# Patient Record
Sex: Male | Born: 1950
Health system: Southern US, Community
[De-identification: ages and names within clinical notes are randomized; demographics above are authoritative.]

## PROBLEM LIST (undated history)

## (undated) ENCOUNTER — Emergency Department (HOSPITAL_COMMUNITY): Admission: EM | Payer: Self-pay | Source: Home / Self Care

## (undated) DIAGNOSIS — J449 Chronic obstructive pulmonary disease, unspecified: Secondary | ICD-10-CM

## (undated) DIAGNOSIS — E785 Hyperlipidemia, unspecified: Secondary | ICD-10-CM

## (undated) DIAGNOSIS — M1990 Unspecified osteoarthritis, unspecified site: Secondary | ICD-10-CM

## (undated) DIAGNOSIS — F209 Schizophrenia, unspecified: Secondary | ICD-10-CM

## (undated) DIAGNOSIS — J9601 Acute respiratory failure with hypoxia: Secondary | ICD-10-CM

## (undated) DIAGNOSIS — K5792 Diverticulitis of intestine, part unspecified, without perforation or abscess without bleeding: Secondary | ICD-10-CM

## (undated) DIAGNOSIS — E7800 Pure hypercholesterolemia, unspecified: Secondary | ICD-10-CM

## (undated) DIAGNOSIS — E669 Obesity, unspecified: Secondary | ICD-10-CM

## (undated) DIAGNOSIS — N179 Acute kidney failure, unspecified: Secondary | ICD-10-CM

## (undated) DIAGNOSIS — F17200 Nicotine dependence, unspecified, uncomplicated: Secondary | ICD-10-CM

## (undated) DIAGNOSIS — E119 Type 2 diabetes mellitus without complications: Secondary | ICD-10-CM

## (undated) DIAGNOSIS — R0902 Hypoxemia: Secondary | ICD-10-CM

## (undated) DIAGNOSIS — F32A Depression, unspecified: Secondary | ICD-10-CM

## (undated) DIAGNOSIS — R918 Other nonspecific abnormal finding of lung field: Secondary | ICD-10-CM

## (undated) DIAGNOSIS — I10 Essential (primary) hypertension: Secondary | ICD-10-CM

## (undated) DIAGNOSIS — F329 Major depressive disorder, single episode, unspecified: Secondary | ICD-10-CM

## (undated) HISTORY — DX: Essential (primary) hypertension: I10

## (undated) HISTORY — DX: Obesity, unspecified: E66.9

## (undated) HISTORY — DX: Schizophrenia, unspecified: F20.9

## (undated) HISTORY — DX: Hypoxemia: R09.02

## (undated) HISTORY — PX: SHOULDER SURGERY: SHX246

## (undated) HISTORY — DX: Hyperlipidemia, unspecified: E78.5

## (undated) HISTORY — DX: Nicotine dependence, unspecified, uncomplicated: F17.200

## (undated) HISTORY — DX: Diverticulitis of intestine, part unspecified, without perforation or abscess without bleeding: K57.92

---

## 1999-08-28 ENCOUNTER — Inpatient Hospital Stay (HOSPITAL_COMMUNITY): Admission: EM | Admit: 1999-08-28 | Discharge: 1999-08-31 | Payer: Self-pay | Admitting: Psychiatry

## 2001-04-15 ENCOUNTER — Emergency Department (HOSPITAL_COMMUNITY): Admission: EM | Admit: 2001-04-15 | Discharge: 2001-04-15 | Payer: Self-pay | Admitting: Emergency Medicine

## 2001-09-23 ENCOUNTER — Encounter: Payer: Self-pay | Admitting: Family Medicine

## 2001-09-23 ENCOUNTER — Ambulatory Visit (HOSPITAL_COMMUNITY): Admission: RE | Admit: 2001-09-23 | Discharge: 2001-09-23 | Payer: Self-pay | Admitting: Family Medicine

## 2003-07-06 ENCOUNTER — Inpatient Hospital Stay (HOSPITAL_COMMUNITY): Admission: EM | Admit: 2003-07-06 | Discharge: 2003-07-07 | Payer: Self-pay | Admitting: *Deleted

## 2003-07-06 ENCOUNTER — Encounter: Payer: Self-pay | Admitting: *Deleted

## 2003-07-07 ENCOUNTER — Encounter: Payer: Self-pay | Admitting: *Deleted

## 2004-11-01 ENCOUNTER — Ambulatory Visit: Payer: Self-pay | Admitting: Family Medicine

## 2005-03-08 ENCOUNTER — Ambulatory Visit: Payer: Self-pay | Admitting: Family Medicine

## 2005-07-05 ENCOUNTER — Ambulatory Visit: Payer: Self-pay | Admitting: Family Medicine

## 2005-09-21 ENCOUNTER — Ambulatory Visit: Payer: Self-pay | Admitting: Family Medicine

## 2005-10-09 ENCOUNTER — Ambulatory Visit: Payer: Self-pay | Admitting: Family Medicine

## 2006-01-03 ENCOUNTER — Ambulatory Visit: Payer: Self-pay | Admitting: Family Medicine

## 2006-02-28 ENCOUNTER — Ambulatory Visit: Payer: Self-pay | Admitting: Family Medicine

## 2006-04-17 ENCOUNTER — Ambulatory Visit: Payer: Self-pay | Admitting: Family Medicine

## 2006-09-20 ENCOUNTER — Ambulatory Visit: Payer: Self-pay | Admitting: Family Medicine

## 2006-12-24 ENCOUNTER — Ambulatory Visit: Payer: Self-pay | Admitting: Family Medicine

## 2007-01-28 ENCOUNTER — Ambulatory Visit (HOSPITAL_COMMUNITY): Admission: RE | Admit: 2007-01-28 | Discharge: 2007-01-28 | Payer: Self-pay | Admitting: Family Medicine

## 2007-01-28 ENCOUNTER — Encounter: Payer: Self-pay | Admitting: Family Medicine

## 2007-01-28 LAB — CONVERTED CEMR LAB
ALT: 29 units/L (ref 0–53)
AST: 26 units/L (ref 0–37)
Albumin: 4.7 g/dL (ref 3.5–5.2)
Alkaline Phosphatase: 50 units/L (ref 39–117)
BUN: 7 mg/dL (ref 6–23)
Basophils Absolute: 0 10*3/uL (ref 0.0–0.1)
Basophils Relative: 1 % (ref 0–1)
Bilirubin, Direct: 0.1 mg/dL (ref 0.0–0.3)
CO2: 26 meq/L (ref 19–32)
Calcium: 10 mg/dL (ref 8.4–10.5)
Chloride: 104 meq/L (ref 96–112)
Cholesterol: 155 mg/dL (ref 0–200)
Creatinine, Ser: 1.09 mg/dL (ref 0.40–1.50)
Eosinophils Absolute: 0.2 10*3/uL (ref 0.0–0.7)
Eosinophils Relative: 3 % (ref 0–5)
Glucose, Bld: 100 mg/dL — ABNORMAL HIGH (ref 70–99)
HCT: 43 % (ref 39.0–52.0)
HDL: 31 mg/dL — ABNORMAL LOW (ref >39)
Hemoglobin: 13.7 g/dL (ref 13.0–17.0)
Hgb A1c MFr Bld: 7.5 % — ABNORMAL HIGH (ref 4.6–6.1)
Indirect Bilirubin: 0.5 mg/dL (ref 0.0–0.9)
LDL Cholesterol: 81 mg/dL (ref 0–99)
Lymphocytes Relative: 45 % (ref 12–46)
Lymphs Abs: 2.5 10*3/uL (ref 0.7–3.3)
MCHC: 31.9 g/dL (ref 30.0–36.0)
MCV: 89 fL (ref 78.0–100.0)
Monocytes Absolute: 0.4 10*3/uL (ref 0.2–0.7)
Monocytes Relative: 7 % (ref 3–11)
Neutro Abs: 2.5 10*3/uL (ref 1.7–7.7)
Neutrophils Relative %: 45 % (ref 43–77)
Platelets: 272 10*3/uL (ref 150–400)
Potassium: 4.3 meq/L (ref 3.5–5.3)
RBC: 4.83 M/uL (ref 4.22–5.81)
RDW: 15.4 % — ABNORMAL HIGH (ref 11.5–14.0)
Sodium: 142 meq/L (ref 135–145)
Total Bilirubin: 0.6 mg/dL (ref 0.3–1.2)
Total CHOL/HDL Ratio: 5
Total Protein: 7.8 g/dL (ref 6.0–8.3)
Triglycerides: 216 mg/dL — ABNORMAL HIGH (ref <150)
VLDL: 43 mg/dL — ABNORMAL HIGH (ref 0–40)
WBC: 5.7 10*3/uL (ref 4.0–10.5)

## 2007-05-23 ENCOUNTER — Ambulatory Visit: Payer: Self-pay | Admitting: Family Medicine

## 2007-06-06 ENCOUNTER — Encounter: Payer: Self-pay | Admitting: Family Medicine

## 2007-06-06 LAB — CONVERTED CEMR LAB
ALT: 29 units/L (ref 0–53)
AST: 23 units/L (ref 0–37)
Albumin: 4.7 g/dL (ref 3.5–5.2)
Alkaline Phosphatase: 50 units/L (ref 39–117)
BUN: 7 mg/dL (ref 6–23)
Bilirubin, Direct: 0.1 mg/dL (ref 0.0–0.3)
CO2: 21 meq/L (ref 19–32)
Calcium: 10.1 mg/dL (ref 8.4–10.5)
Chloride: 105 meq/L (ref 96–112)
Creatinine, Ser: 1.07 mg/dL (ref 0.40–1.50)
Glucose, Bld: 106 mg/dL — ABNORMAL HIGH (ref 70–99)
Indirect Bilirubin: 0.3 mg/dL (ref 0.0–0.9)
PSA: 0.83 ng/mL (ref 0.10–4.00)
Potassium: 3.9 meq/L (ref 3.5–5.3)
Sodium: 140 meq/L (ref 135–145)
Total Bilirubin: 0.4 mg/dL (ref 0.3–1.2)
Total Protein: 7.9 g/dL (ref 6.0–8.3)

## 2007-10-14 ENCOUNTER — Ambulatory Visit: Payer: Self-pay | Admitting: Family Medicine

## 2007-12-02 ENCOUNTER — Ambulatory Visit: Payer: Self-pay | Admitting: Family Medicine

## 2008-01-25 ENCOUNTER — Emergency Department (HOSPITAL_COMMUNITY): Admission: EM | Admit: 2008-01-25 | Discharge: 2008-01-25 | Payer: Self-pay | Admitting: Emergency Medicine

## 2008-02-22 ENCOUNTER — Emergency Department (HOSPITAL_COMMUNITY): Admission: EM | Admit: 2008-02-22 | Discharge: 2008-02-22 | Payer: Self-pay | Admitting: Emergency Medicine

## 2008-05-28 ENCOUNTER — Ambulatory Visit: Payer: Self-pay | Admitting: Family Medicine

## 2008-05-29 ENCOUNTER — Encounter: Payer: Self-pay | Admitting: Family Medicine

## 2008-05-29 LAB — CONVERTED CEMR LAB: Microalb, Ur: 1.67 mg/dL (ref 0.00–1.89)

## 2008-06-02 DIAGNOSIS — F17200 Nicotine dependence, unspecified, uncomplicated: Secondary | ICD-10-CM | POA: Insufficient documentation

## 2008-06-02 DIAGNOSIS — IMO0001 Reserved for inherently not codable concepts without codable children: Secondary | ICD-10-CM | POA: Insufficient documentation

## 2008-06-02 DIAGNOSIS — E1165 Type 2 diabetes mellitus with hyperglycemia: Secondary | ICD-10-CM

## 2008-06-02 DIAGNOSIS — I10 Essential (primary) hypertension: Secondary | ICD-10-CM | POA: Insufficient documentation

## 2008-06-02 DIAGNOSIS — E669 Obesity, unspecified: Secondary | ICD-10-CM | POA: Insufficient documentation

## 2008-06-02 DIAGNOSIS — F209 Schizophrenia, unspecified: Secondary | ICD-10-CM | POA: Insufficient documentation

## 2008-06-02 DIAGNOSIS — E785 Hyperlipidemia, unspecified: Secondary | ICD-10-CM | POA: Insufficient documentation

## 2008-06-25 ENCOUNTER — Encounter: Payer: Self-pay | Admitting: Family Medicine

## 2008-06-25 LAB — CONVERTED CEMR LAB
ALT: 28 units/L (ref 0–53)
AST: 24 units/L (ref 0–37)
Albumin: 4.7 g/dL (ref 3.5–5.2)
Alkaline Phosphatase: 46 units/L (ref 39–117)
BUN: 10 mg/dL (ref 6–23)
Bilirubin, Direct: 0.1 mg/dL (ref 0.0–0.3)
CO2: 19 meq/L (ref 19–32)
Calcium: 10.3 mg/dL (ref 8.4–10.5)
Chloride: 103 meq/L (ref 96–112)
Cholesterol: 154 mg/dL (ref 0–200)
Creatinine, Ser: 1.17 mg/dL (ref 0.40–1.50)
Glucose, Bld: 164 mg/dL — ABNORMAL HIGH (ref 70–99)
HDL: 26 mg/dL — ABNORMAL LOW (ref >39)
Indirect Bilirubin: 0.3 mg/dL (ref 0.0–0.9)
LDL Cholesterol: 69 mg/dL (ref 0–99)
PSA: 1.54 ng/mL (ref 0.10–4.00)
Potassium: 3.9 meq/L (ref 3.5–5.3)
Sodium: 140 meq/L (ref 135–145)
Total Bilirubin: 0.4 mg/dL (ref 0.3–1.2)
Total CHOL/HDL Ratio: 5.9
Total Protein: 7.4 g/dL (ref 6.0–8.3)
Triglycerides: 295 mg/dL — ABNORMAL HIGH (ref <150)
VLDL: 59 mg/dL — ABNORMAL HIGH (ref 0–40)

## 2008-07-08 ENCOUNTER — Ambulatory Visit: Payer: Self-pay | Admitting: Family Medicine

## 2008-07-16 ENCOUNTER — Telehealth: Payer: Self-pay | Admitting: Family Medicine

## 2008-08-14 ENCOUNTER — Encounter: Payer: Self-pay | Admitting: Family Medicine

## 2008-08-28 ENCOUNTER — Encounter: Payer: Self-pay | Admitting: Family Medicine

## 2008-10-22 ENCOUNTER — Ambulatory Visit: Payer: Self-pay | Admitting: Family Medicine

## 2008-10-22 LAB — CONVERTED CEMR LAB
Glucose, Bld: 129 mg/dL
Hgb A1c MFr Bld: 8.5 %

## 2009-01-15 ENCOUNTER — Encounter: Payer: Self-pay | Admitting: Family Medicine

## 2009-01-26 ENCOUNTER — Encounter: Payer: Self-pay | Admitting: Family Medicine

## 2009-02-25 ENCOUNTER — Ambulatory Visit: Payer: Self-pay | Admitting: Family Medicine

## 2009-02-25 DIAGNOSIS — R5383 Other fatigue: Secondary | ICD-10-CM | POA: Insufficient documentation

## 2009-02-25 DIAGNOSIS — N3000 Acute cystitis without hematuria: Secondary | ICD-10-CM | POA: Insufficient documentation

## 2009-02-25 DIAGNOSIS — R5381 Other malaise: Secondary | ICD-10-CM | POA: Insufficient documentation

## 2009-02-25 LAB — CONVERTED CEMR LAB
Bilirubin Urine: NEGATIVE
Blood in Urine, dipstick: NEGATIVE
Glucose, Bld: 164 mg/dL
Glucose, Urine, Semiquant: NEGATIVE
Hgb A1c MFr Bld: 7.1 %
Ketones, urine, test strip: NEGATIVE
Nitrite: NEGATIVE
Protein, U semiquant: NEGATIVE
Specific Gravity, Urine: 1.01
Urobilinogen, UA: 0.2
WBC Urine, dipstick: NEGATIVE
pH: 6

## 2009-02-27 ENCOUNTER — Encounter: Payer: Self-pay | Admitting: Family Medicine

## 2009-02-27 ENCOUNTER — Emergency Department (HOSPITAL_COMMUNITY): Admission: EM | Admit: 2009-02-27 | Discharge: 2009-02-27 | Payer: Self-pay | Admitting: Emergency Medicine

## 2009-03-08 ENCOUNTER — Ambulatory Visit: Payer: Self-pay | Admitting: Family Medicine

## 2009-03-08 DIAGNOSIS — R519 Headache, unspecified: Secondary | ICD-10-CM | POA: Insufficient documentation

## 2009-03-08 DIAGNOSIS — R51 Headache: Secondary | ICD-10-CM | POA: Insufficient documentation

## 2009-03-08 LAB — CONVERTED CEMR LAB: Glucose, Bld: 157 mg/dL

## 2009-03-10 ENCOUNTER — Encounter: Payer: Self-pay | Admitting: Family Medicine

## 2009-03-23 ENCOUNTER — Encounter: Payer: Self-pay | Admitting: Family Medicine

## 2009-03-24 ENCOUNTER — Ambulatory Visit: Payer: Self-pay | Admitting: Family Medicine

## 2009-03-24 ENCOUNTER — Telehealth: Payer: Self-pay | Admitting: Family Medicine

## 2009-03-24 LAB — CONVERTED CEMR LAB: Glucose, Bld: 136 mg/dL

## 2009-03-25 ENCOUNTER — Encounter: Payer: Self-pay | Admitting: Family Medicine

## 2009-07-13 ENCOUNTER — Encounter: Payer: Self-pay | Admitting: Family Medicine

## 2009-07-22 ENCOUNTER — Encounter: Payer: Self-pay | Admitting: Family Medicine

## 2009-07-23 ENCOUNTER — Encounter: Payer: Self-pay | Admitting: Family Medicine

## 2009-08-27 ENCOUNTER — Ambulatory Visit: Payer: Self-pay | Admitting: Family Medicine

## 2009-08-27 LAB — CONVERTED CEMR LAB
Blood Glucose, Fasting: 162 mg/dL
Hgb A1c MFr Bld: 6.2 %

## 2009-08-30 LAB — CONVERTED CEMR LAB
ALT: 54 units/L — ABNORMAL HIGH (ref 0–53)
AST: 33 units/L (ref 0–37)
Albumin: 4.6 g/dL (ref 3.5–5.2)
Alkaline Phosphatase: 56 units/L (ref 39–117)
BUN: 8 mg/dL (ref 6–23)
Basophils Absolute: 0 10*3/uL (ref 0.0–0.1)
Basophils Relative: 1 % (ref 0–1)
Bilirubin, Direct: 0.1 mg/dL (ref 0.0–0.3)
CO2: 23 meq/L (ref 19–32)
Calcium: 10.5 mg/dL (ref 8.4–10.5)
Chloride: 103 meq/L (ref 96–112)
Cholesterol: 140 mg/dL (ref 0–200)
Creatinine, Ser: 1.09 mg/dL (ref 0.40–1.50)
Creatinine, Urine: 196.4 mg/dL
Eosinophils Absolute: 0.2 10*3/uL (ref 0.0–0.7)
Eosinophils Relative: 4 % (ref 0–5)
Glucose, Bld: 99 mg/dL (ref 70–99)
HCT: 46.9 % (ref 39.0–52.0)
HDL: 34 mg/dL — ABNORMAL LOW (ref >39)
Hemoglobin: 15 g/dL (ref 13.0–17.0)
Indirect Bilirubin: 0.3 mg/dL (ref 0.0–0.9)
LDL Cholesterol: 45 mg/dL (ref 0–99)
Lymphocytes Relative: 50 % — ABNORMAL HIGH (ref 12–46)
Lymphs Abs: 2.9 10*3/uL (ref 0.7–4.0)
MCHC: 32 g/dL (ref 30.0–36.0)
MCV: 90.4 fL (ref 78.0–100.0)
Microalb Creat Ratio: 13 mg/g (ref 0.0–30.0)
Microalb, Ur: 2.55 mg/dL — ABNORMAL HIGH (ref 0.00–1.89)
Monocytes Absolute: 0.5 10*3/uL (ref 0.1–1.0)
Monocytes Relative: 8 % (ref 3–12)
Neutro Abs: 2.3 10*3/uL (ref 1.7–7.7)
Neutrophils Relative %: 39 % — ABNORMAL LOW (ref 43–77)
Platelets: 263 10*3/uL (ref 150–400)
Potassium: 4.1 meq/L (ref 3.5–5.3)
RBC: 5.19 M/uL (ref 4.22–5.81)
RDW: 14.2 % (ref 11.5–15.5)
Sodium: 141 meq/L (ref 135–145)
TSH: 1.121 microintl units/mL (ref 0.350–4.500)
Total Bilirubin: 0.4 mg/dL (ref 0.3–1.2)
Total CHOL/HDL Ratio: 4.1
Total Protein: 8 g/dL (ref 6.0–8.3)
Triglycerides: 307 mg/dL — ABNORMAL HIGH (ref <150)
VLDL: 61 mg/dL — ABNORMAL HIGH (ref 0–40)
WBC: 5.9 10*3/uL (ref 4.0–10.5)

## 2009-09-08 ENCOUNTER — Encounter: Payer: Self-pay | Admitting: Family Medicine

## 2009-09-23 ENCOUNTER — Ambulatory Visit: Payer: Self-pay | Admitting: Family Medicine

## 2009-10-21 ENCOUNTER — Telehealth: Payer: Self-pay | Admitting: Family Medicine

## 2009-10-21 ENCOUNTER — Encounter: Payer: Self-pay | Admitting: Family Medicine

## 2009-10-25 ENCOUNTER — Telehealth: Payer: Self-pay | Admitting: Family Medicine

## 2010-01-05 ENCOUNTER — Encounter (INDEPENDENT_AMBULATORY_CARE_PROVIDER_SITE_OTHER): Payer: Self-pay | Admitting: *Deleted

## 2010-03-01 ENCOUNTER — Ambulatory Visit: Payer: Self-pay | Admitting: Family Medicine

## 2010-03-01 ENCOUNTER — Encounter (INDEPENDENT_AMBULATORY_CARE_PROVIDER_SITE_OTHER): Payer: Self-pay | Admitting: *Deleted

## 2010-03-01 DIAGNOSIS — J209 Acute bronchitis, unspecified: Secondary | ICD-10-CM | POA: Insufficient documentation

## 2010-03-07 ENCOUNTER — Encounter: Payer: Self-pay | Admitting: Family Medicine

## 2010-05-11 ENCOUNTER — Encounter: Payer: Self-pay | Admitting: Family Medicine

## 2010-05-27 ENCOUNTER — Ambulatory Visit: Payer: Self-pay | Admitting: Family Medicine

## 2010-05-27 DIAGNOSIS — M542 Cervicalgia: Secondary | ICD-10-CM | POA: Insufficient documentation

## 2010-05-27 LAB — CONVERTED CEMR LAB
ALT: 36 units/L (ref 0–53)
AST: 29 units/L (ref 0–37)
Albumin: 4.5 g/dL (ref 3.5–5.2)
Alkaline Phosphatase: 45 units/L (ref 39–117)
BUN: 6 mg/dL (ref 6–23)
Bilirubin, Direct: 0.1 mg/dL (ref 0.0–0.3)
CO2: 24 meq/L (ref 19–32)
Calcium: 9.5 mg/dL (ref 8.4–10.5)
Chloride: 103 meq/L (ref 96–112)
Cholesterol: 156 mg/dL (ref 0–200)
Creatinine, Ser: 1.13 mg/dL (ref 0.40–1.50)
Glucose, Bld: 86 mg/dL (ref 70–99)
HDL: 33 mg/dL — ABNORMAL LOW (ref >39)
Hgb A1c MFr Bld: 6.1 % — ABNORMAL HIGH (ref <5.7)
Indirect Bilirubin: 0.3 mg/dL (ref 0.0–0.9)
Potassium: 4.2 meq/L (ref 3.5–5.3)
Sodium: 139 meq/L (ref 135–145)
Total Bilirubin: 0.4 mg/dL (ref 0.3–1.2)
Total CHOL/HDL Ratio: 4.7
Total Protein: 7.5 g/dL (ref 6.0–8.3)
Triglycerides: 428 mg/dL — ABNORMAL HIGH (ref <150)

## 2010-09-05 ENCOUNTER — Encounter: Payer: Self-pay | Admitting: Family Medicine

## 2010-10-07 ENCOUNTER — Ambulatory Visit: Payer: Self-pay | Admitting: Family Medicine

## 2010-10-07 LAB — CONVERTED CEMR LAB
BUN: 6 mg/dL (ref 6–23)
Basophils Absolute: 0 10*3/uL (ref 0.0–0.1)
Basophils Relative: 0 % (ref 0–1)
CO2: 22 meq/L (ref 19–32)
Calcium: 10 mg/dL (ref 8.4–10.5)
Chloride: 101 meq/L (ref 96–112)
Creatinine, Ser: 1.09 mg/dL (ref 0.40–1.50)
Eosinophils Absolute: 0.2 10*3/uL (ref 0.0–0.7)
Eosinophils Relative: 3 % (ref 0–5)
Glucose, Bld: 127 mg/dL — ABNORMAL HIGH (ref 70–99)
HCT: 41.7 % (ref 39.0–52.0)
Hemoglobin: 13.9 g/dL (ref 13.0–17.0)
Hgb A1c MFr Bld: 6.3 % — ABNORMAL HIGH (ref <5.7)
Lymphocytes Relative: 47 % — ABNORMAL HIGH (ref 12–46)
Lymphs Abs: 3.2 10*3/uL (ref 0.7–4.0)
MCHC: 33.3 g/dL (ref 30.0–36.0)
MCV: 88.3 fL (ref 78.0–100.0)
Monocytes Absolute: 0.7 10*3/uL (ref 0.1–1.0)
Monocytes Relative: 9 % (ref 3–12)
Neutro Abs: 2.8 10*3/uL (ref 1.7–7.7)
Neutrophils Relative %: 40 % — ABNORMAL LOW (ref 43–77)
PSA: 1.39 ng/mL (ref 0.10–4.00)
Platelets: 246 10*3/uL (ref 150–400)
Potassium: 3.8 meq/L (ref 3.5–5.3)
RBC: 4.72 M/uL (ref 4.22–5.81)
RDW: 14.1 % (ref 11.5–15.5)
Sodium: 137 meq/L (ref 135–145)
WBC: 6.9 10*3/uL (ref 4.0–10.5)

## 2010-10-20 ENCOUNTER — Telehealth: Payer: Self-pay | Admitting: Family Medicine

## 2010-11-03 ENCOUNTER — Encounter: Payer: Self-pay | Admitting: Family Medicine

## 2010-11-22 ENCOUNTER — Telehealth: Payer: Self-pay | Admitting: Family Medicine

## 2010-12-27 ENCOUNTER — Encounter: Payer: Self-pay | Admitting: Family Medicine

## 2010-12-28 LAB — CONVERTED CEMR LAB
ALT: 35 units/L (ref 0–53)
AST: 23 units/L (ref 0–37)
Albumin: 4.5 g/dL (ref 3.5–5.2)
Alkaline Phosphatase: 44 units/L (ref 39–117)
BUN: 16 mg/dL (ref 6–23)
Bilirubin, Direct: 0.2 mg/dL (ref 0.0–0.3)
CO2: 24 meq/L (ref 19–32)
Calcium: 10 mg/dL (ref 8.4–10.5)
Chloride: 104 meq/L (ref 96–112)
Cholesterol: 166 mg/dL (ref 0–200)
Creatinine, Ser: 1.08 mg/dL (ref 0.40–1.50)
Glucose, Bld: 112 mg/dL — ABNORMAL HIGH (ref 70–99)
HDL: 30 mg/dL — ABNORMAL LOW (ref >39)
Hgb A1c MFr Bld: 6.8 % — ABNORMAL HIGH (ref <5.7)
Indirect Bilirubin: 0.2 mg/dL (ref 0.0–0.9)
LDL Cholesterol: 70 mg/dL (ref 0–99)
Potassium: 3.9 meq/L (ref 3.5–5.3)
Sodium: 140 meq/L (ref 135–145)
Total Bilirubin: 0.4 mg/dL (ref 0.3–1.2)
Total CHOL/HDL Ratio: 5.5
Total Protein: 7.5 g/dL (ref 6.0–8.3)
Triglycerides: 329 mg/dL — ABNORMAL HIGH (ref <150)
VLDL: 66 mg/dL — ABNORMAL HIGH (ref 0–40)

## 2010-12-29 ENCOUNTER — Telehealth: Payer: Self-pay | Admitting: Family Medicine

## 2011-01-06 ENCOUNTER — Ambulatory Visit: Admit: 2011-01-06 | Payer: Self-pay | Admitting: Family Medicine

## 2011-01-10 ENCOUNTER — Encounter: Payer: Self-pay | Admitting: Family Medicine

## 2011-01-17 NOTE — Letter (Signed)
Summary: CCME  CCME   Imported By: Lind Guest 11/03/2010 08:31:29  _____________________________________________________________________  External Attachment:    Type:   Image     Comment:   External Document

## 2011-01-17 NOTE — Miscellaneous (Signed)
Summary: refill  Clinical Lists Changes  Medications: Rx of RISPERDAL 4 MG TABS (RISPERIDONE) Take 1 tab by mouth at bedtime;  #30 x 5;  Signed;  Entered by: Everitt Amber;  Authorized by: Syliva Overman MD;  Method used: Electronically to [facility]*, [address], [place], [place], [place]  [address], Ph: [phone], Fax: [phone]    Prescriptions: RISPERDAL 4 MG TABS (RISPERIDONE) Take 1 tab by mouth at bedtime  #30 x 5   Entered by:   Everitt Amber   Authorized by:   Syliva Overman MD   Signed by:   Everitt Amber on 07/13/2009   Method used:   Electronically to        [facility]* (retail)       [address] [place], [place]  [address]       Ph: [phone]       Fax: [phone]   RxID:   [id number]

## 2011-01-17 NOTE — Assessment & Plan Note (Signed)
Summary: OV   Vital Signs:  Patient Profile:   60 Years Old Male Height:     73 inches (185.42 cm) Weight:      270 pounds (122.73 kg) BMI:     35.75 BSA:     2.45 O2 Sat:      96 % Pulse rate:   75 / minute Resp:     14 per minute BP sitting:   160 / 94  (left arm)  Pt. in pain?   no  Vitals Entered By: Everitt Amber (October 22, 2008 1:22 PM)                  Chief Complaint:  Follow up.  History of Present Illness: Pt doing fairly well although he has not been testing his blood sugars regularly and has not been  diligent with his diet. he denies any recent fever or chills. he dnies depression, anxiety, insomnia or hallucinations. H e denies polyuria, or symotioms of uncontolled diabees. He is requesting that I prescribe his psychotic meds as his psychiatrist is leaving, he has been stable on the same meds for some time.     Updated Prior Medication List: METFORMIN HCL 1000 MG  TABS (METFORMIN HCL) Take 1 tablet by mouth two times a day CADUET 5-20 MG  TABS (AMLODIPINE-ATORVASTATIN) Take 1 tablet by mouth once a day ASPIRIN 325 MG  TABS (ASPIRIN) Take 1 tablet by mouth once a day ANTARA 130 MG  CAPS (FENOFIBRATE MICRONIZED) Take 1 tab by mouth at bedtime DIOVAN 160 MG  TABS (VALSARTAN) Take 1 tablet by mouth once a day RISPERDAL 4 MG  TABS (RISPERIDONE) Take 1 tab by mouth at bedtime OSCAL 500/200 D-3 500-200 MG-UNIT  TABS (CALCIUM-VITAMIN D) Take 1 tablet by mouth two times a day GLIPIZIDE 2.5 MG  TB24 (GLIPIZIDE) Take three tablets by mouth twice daily HYDROXYZINE HCL 25 MG TABS (HYDROXYZINE HCL) Take one tab by mouth at bedtime  Current Allergies: No known allergies     Risk Factors:     Counseled to quit/cut down tobacco use:  yes   Review of Systems  General      Denies chills, fatigue, fever, loss of appetite, malaise, sleep disorder, sweats, weakness, and weight loss.  ENT      Denies earache, hoarseness, nasal congestion, sinus pressure, and sore  throat.  CV      Denies chest pain or discomfort, palpitations, shortness of breath with exertion, and swelling of feet.  Resp      Denies cough, sputum productive, and wheezing.  GI      Denies abdominal pain, constipation, diarrhea, nausea, and vomiting.  GU      Denies dysuria and urinary frequency.  MS      Complains of joint pain, low back pain, muscle aches, and muscle weakness.  Psych      Complains of anxiety and depression.      Denies suicidal thoughts/plans, thoughts of violence, and unusual visions or sounds.  Endo      Denies cold intolerance, excessive hunger, excessive thirst, excessive urination, heat intolerance, polyuria, and weight change.  Allergy      Complains of seasonal allergies.   Physical Exam  General:     obese Head:     Normocephalic and atraumatic without obvious abnormalities. No apparent alopecia or balding. Eyes:     vision grossly intact.   Ears:     External ear exam shows no significant lesions or deformities.  Otoscopic  examination reveals clear canals, tympanic membranes are intact bilaterally without bulging, retraction, inflammation or discharge. Hearing is grossly normal bilaterally. Nose:     no nasal discharge.   Mouth:     pharynx pink and moist and poor dentition.   Neck:     No deformities, masses, or tenderness noted. Lungs:     Normal respiratory effort, chest expands symmetrically. Lungs are clear to auscultation, no crackles or wheezes. Heart:     Normal rate and regular rhythm. S1 and S2 normal without gallop, murmur, click, rub or other extra sounds. Abdomen:     soft and non-tender.   Extremities:     No clubbing, cyanosis, edema, or deformity noted with normal full range of motion of all joints.   Neurologic:     alert & oriented X3, cranial nerves II-XII intact, and strength normal in all extremities.   Skin:     Intact without suspicious lesions or rashes Cervical Nodes:     No lymphadenopathy  noted Psych:     Cognition and judgment appear intact. Alert and cooperative with normal attention span and concentration. No apparent delusions, illusions, hallucinations  Diabetes Management Exam:    Foot Exam (with socks and/or shoes not present):       Sensory-Pinprick/Light touch:          Left medial foot (L-4): diminished          Left dorsal foot (L-5): diminished          Left lateral foot (S-1): diminished          Right medial foot (L-4): diminished          Right dorsal foot (L-5): diminished          Right lateral foot (S-1): diminished       Inspection:          Left foot: normal          Right foot: normal       Nails:          Left foot: fungal infection          Right foot: fungal infection    Eye Exam:       Eye Exam done elsewhere    Impression & Recommendations:  Problem # 1:  NICOTINE ADDICTION (ICD-305.1) Assessment: Unchanged Encouraged smoking cessation and discussed different methods for smoking cessation.   Problem # 2:  SCHIZOPHRENIA (ICD-295.90) Assessment: Unchanged advise pt that as long a she is stable I will prescribe his meds  Problem # 3:  HYPERLIPIDEMIA (ICD-272.4) Assessment: Comment Only  His updated medication list for this problem includes:    Caduet 5-20 Mg Tabs (Amlodipine-atorvastatin) .Marland Kitchen... Take 1 tablet by mouth once a day    Antara 130 Mg Caps (Fenofibrate micronized) .Marland Kitchen... Take 1 tab by mouth at bedtime  Orders: T-Lipid Profile [phone])  Labs Reviewed: Chol: 154 (06/25/2008)   HDL: 26 (06/25/2008)   LDL: 69 (06/25/2008)   TG: 295 (06/25/2008) SGOT: 24 (06/25/2008)   SGPT: 28 (06/25/2008)   Problem # 4:  HYPERTENSION (ICD-401.9) Assessment: Deteriorated  The following medications were removed from the medication list:    Diovan 160 Mg Tabs (Valsartan) .Marland Kitchen... Take 1 tablet by mouth once a day  His updated medication list for this problem includes:    Caduet 5-20 Mg Tabs (Amlodipine-atorvastatin) .Marland Kitchen... Take 1  tablet by mouth once a day    Diovan 320 Mg Tabs (Valsartan) .Marland Kitchen... Take 1 tablet by mouth once a  day  Orders: T-Basic Metabolic Panel [phone])  BP today: 160/94  Labs Reviewed: Creat: 1.17 (06/25/2008) Chol: 154 (06/25/2008)   HDL: 26 (06/25/2008)   LDL: 69 (06/25/2008)   TG: 295 (06/25/2008)   Problem # 5:  DIABETES MELLITUS, TYPE II (ICD-250.00) Assessment: Deteriorated  The following medications were removed from the medication list:    Diovan 160 Mg Tabs (Valsartan) .Marland Kitchen... Take 1 tablet by mouth once a day  His updated medication list for this problem includes:    Metformin Hcl 1000 Mg Tabs (Metformin hcl) .Marland Kitchen... Take 1 tablet by mouth two times a day    Aspirin 325 Mg Tabs (Aspirin) .Marland Kitchen... Take 1 tablet by mouth once a day    Glipizide 10 Mg Xr24h-tab (Glipizide) .Marland Kitchen... Take 1 tablet by mouth two times a day    Diovan 320 Mg Tabs (Valsartan) .Marland Kitchen... Take 1 tablet by mouth once a day  Orders: Ophthalmology Referral (Ophthalmology) Glucose, (CBG) [phone]) Hemoglobin A1C [phone])  Labs Reviewed: HgBA1c: 8.5 (10/22/2008)   Creat: 1.17 (06/25/2008)   Microalbumin: 1.67 (05/29/2008)   Complete Medication List: 1)  Metformin Hcl 1000 Mg Tabs (Metformin hcl) .... Take 1 tablet by mouth two times a day 2)  Caduet 5-20 Mg Tabs (Amlodipine-atorvastatin) .... Take 1 tablet by mouth once a day 3)  Aspirin 325 Mg Tabs (Aspirin) .... Take 1 tablet by mouth once a day 4)  Antara 130 Mg Caps (Fenofibrate micronized) .... Take 1 tab by mouth at bedtime 5)  Risperdal 4 Mg Tabs (Risperidone) .... Take 1 tab by mouth at bedtime 6)  Oscal 500/200 D-3 500-200 Mg-unit Tabs (Calcium-vitamin d) .... Take 1 tablet by mouth two times a day 7)  Glipizide 10 Mg Xr24h-tab (Glipizide) .... Take 1 tablet by mouth two times a day 8)  Hydroxyzine Hcl 25 Mg Tabs (Hydroxyzine hcl) .... Take one tab by mouth at bedtime 9)  Risperdal 4 Mg Tabs (Risperidone) .... Take 1 tab by mouth at bedtime 10)  Diovan 320  Mg Tabs (Valsartan) .... Take 1 tablet by mouth once a day   Patient Instructions: 1)  Follow up end Feb or early March. 2)  Tobacco is very bad for your health and your loved ones! You Should stop smoking!. 3)  Stop Smoking Tips: Choose a Quit date. Cut down before the Quit date. decide what you will do as a substitute when you feel the urge to smoke(gum,toothpick,exercise). 4)  It is important that you exercise regularly at least 20 minutes 5 times a week. If you develop chest pain, have severe difficulty breathing, or feel very tired , stop exercising immediately and seek medical attention. 5)  You need to lose weight. Consider a lower calorie diet and regular exercise.  6)  BMP prior to visit, ICD-9: 7)  Hepatic Panel prior to visit, ICD-9:   End Feb or ealy March 8)  Lipid Panel prior to visit, ICD-9:   Prescriptions: ANTARA 130 MG  CAPS (FENOFIBRATE MICRONIZED) Take 1 tab by mouth at bedtime  #30 x 5   Entered by:   Everitt Amber   Authorized by:   Syliva Overman MD   Signed by:   Everitt Amber on 10/22/2008   Method used:   Electronically to        [facility]* (retail)       [address]       [place], [place]  [address]  Ph: [phone]       Fax: [phone]   RxID:   [id number] CADUET 5-20 MG  TABS (AMLODIPINE-ATORVASTATIN) Take 1 tablet by mouth once a day  #30 x 5   Entered by:   Everitt Amber   Authorized by:   Syliva Overman MD   Signed by:   Everitt Amber on 10/22/2008   Method used:   Electronically to        [facility]* (retail)       [address] [place], [place]  [address]       Ph: [phone]       Fax: [phone]   RxID:   [id number] METFORMIN HCL 1000 MG  TABS (METFORMIN HCL) Take 1 tablet by mouth two times a day  #30 x 5   Entered by:   Everitt Amber   Authorized by:   Syliva Overman MD   Signed by:   Everitt Amber on 10/22/2008   Method used:    Electronically to        [facility]* (retail)       [address] [place], [place]  [address]       Ph: [phone]       Fax: [phone]   RxID:   [id number] GLIPIZIDE 10 MG XR24H-TAB (GLIPIZIDE) Take 1 tablet by mouth two times a day  #60 x 5   Entered by:   Everitt Amber   Authorized by:   Syliva Overman MD   Signed by:   Everitt Amber on 10/22/2008   Method used:   Electronically to        [facility]* (retail)       [address]       [place], [place]  [address]       Ph: [phone]       Fax: [phone]   RxID:   [id number] DIOVAN 320 MG TABS (VALSARTAN) Take 1 tablet by mouth once a day  #30 x 5   Entered and Authorized by:   Syliva Overman MD   Signed by:   Syliva Overman MD on 10/22/2008   Method used:   Electronically to        [facility]* (retail)       [address] [place], [place]  [address]       Ph: [phone]       Fax: [phone]   RxID:   [id number] RISPERDAL 4 MG TABS (RISPERIDONE) Take 1 tab by mouth at bedtime  #30 x 5   Entered and Authorized by:   Syliva Overman MD   Signed by:   Syliva Overman MD on 10/22/2008   Method used:   Electronically to        [facility]* (retail)       [address] [place], [place]  [address]       Ph: [phone]       Fax: [phone]   RxID:   [id number]  ] Laboratory Results   Blood Tests   Date/Time Received: October 22, 2008 2pm Date/Time Reported: October 22, 2008  2pm  Glucose (random): 129 mg/dL   (Normal Range: 54-098) HGBA1C: 8.5%   (Normal Range: Non-Diabetic - 3-6%   Control Diabetic - 6-8%)

## 2011-01-17 NOTE — Miscellaneous (Signed)
Summary: Refill  Clinical Lists Changes  Medications: Changed medication from CADUET 10-40 MG TABS (AMLODIPINE-ATORVASTATIN) Take 1 tab by mouth at bedtime to AMLODIPINE BESYLATE 10 MG TABS (AMLODIPINE BESYLATE) Take 1 tablet by mouth at bedtime - Signed Added new medication of LIPITOR 40 MG TABS (ATORVASTATIN CALCIUM) Take 1 tab by mouth at bedtime - Signed Rx of AMLODIPINE BESYLATE 10 MG TABS (AMLODIPINE BESYLATE) Take 1 tablet by mouth at bedtime;  #30 x 3;  Signed;  Entered by: Everitt Amber;  Authorized by: Syliva Overman MD;  Method used: Electronically to [facility]*, [address], [place], [place], [place]  [address], Ph: [phone], Fax: [phone] Rx of LIPITOR 40 MG TABS (ATORVASTATIN CALCIUM) Take 1 tab by mouth at bedtime;  #30 x 3;  Signed;  Entered by: Everitt Amber;  Authorized by: Syliva Overman MD;  Method used: Electronically to [facility]*, [address] [place], [place], [place]  [address], Ph: [phone], Fax: [phone]    Prescriptions: LIPITOR 40 MG TABS (ATORVASTATIN CALCIUM) Take 1 tab by mouth at bedtime  #30 x 3   Entered by:   Everitt Amber   Authorized by:   Syliva Overman MD   Signed by:   Everitt Amber on 03/23/2009   Method used:   Electronically to        [facility]* (retail)       [address] [place], [place]  [address]       Ph: [phone]       Fax: [phone]   RxID:   [id number] AMLODIPINE BESYLATE 10 MG TABS (AMLODIPINE BESYLATE) Take 1 tablet by mouth at bedtime  #30 x 3   Entered by:   Everitt Amber   Authorized by:   Syliva Overman MD   Signed by:   Everitt Amber on 03/23/2009   Method used:   Electronically to        [facility]* (retail)       [address] [place], [place]  [address]       Ph: [phone]       Fax: [phone]   RxID:   [id number]

## 2011-01-17 NOTE — Progress Notes (Signed)
Summary: needs a cna  Phone Note Call from Patient Call back at Home Phone [phone]   Summary of Call:  [employer] the insurance said  they can help him with a cna at his house needs Dr. Lodema Hong to call [employer] [phone] leave member id # [id number] they will need his member id #  Initial call taken by: Lind Guest,  July 16, 2008 11:28 AM  Follow-up for Phone Call        contacted [employer] and was advised to send order to home health agency and the agency would need to call Follow-up by: Worthy Keeler LPN,  July 16, 2008 3:42 PM

## 2011-01-17 NOTE — Progress Notes (Signed)
  Phone Note Call from Patient   Summary of Call: Patient called and said that he just took his sugar 2 hours after eating and it was 589. He doesn't feel any symptoms of hypergylcemia but is concerned. Advised him to find a ride and get him to come in for nurse visit to have it checked and to recieve some insulin. Patient stated he would try to get a ride and come on in. Initial call taken by: Everitt Amber,  March 24, 2009 1:41 PM      Appended Document:  Patients sugar was 136 in office.

## 2011-01-17 NOTE — Progress Notes (Signed)
Summary: MEDICINE  Phone Note Call from Patient   Summary of Call: WANTS TO KNOW WILL YOU CALL IN SOME MALE [facility] MEDICINE   Doyce Para AT [phone] Initial call taken by: Lind Guest,  November 22, 2010 1:06 PM  Follow-up for Phone Call        pls let pt know med sent in Follow-up by: Syliva Overman MD,  November 23, 2010 4:58 PM    New/Updated Medications: VIAGRA 100 MG TABS (SILDENAFIL CITRATE) one tablet 30 minutes before intercourse Prescriptions: VIAGRA 100 MG TABS (SILDENAFIL CITRATE) one tablet 30 minutes before intercourse  #5 x 2   Entered and Authorized by:   Syliva Overman MD   Signed by:   Syliva Overman MD on 11/23/2010   Method used:   Electronically to        [facility]* (retail)       [address]       [place], [place]  [address]       Ph: [phone]       Fax: [phone]   RxID:   [id number]

## 2011-01-17 NOTE — Miscellaneous (Signed)
Summary: refill  Clinical Lists Changes  Medications: Rx of HYDROXYZINE HCL 25 MG TABS (HYDROXYZINE HCL) Take one tab by mouth at bedtime;  #30 Each x 4;  Signed;  Entered by: Everitt Amber;  Authorized by: Syliva Overman MD;  Method used: Electronically to [facility]*, [address], [place], [place], [place]  [address], Ph: [phone], Fax: [phone]    Prescriptions: HYDROXYZINE HCL 25 MG TABS (HYDROXYZINE HCL) Take one tab by mouth at bedtime  #30 Each x 4   Entered by:   Everitt Amber   Authorized by:   Syliva Overman MD   Signed by:   Everitt Amber on 07/22/2009   Method used:   Electronically to        [facility]* (retail)       [address] [place], [place]  [address]       Ph: [phone]       Fax: [phone]   RxID:   [id number]

## 2011-01-17 NOTE — Assessment & Plan Note (Signed)
Summary: FLU SHOT  Nurse Visit   Allergies: No Known Drug Allergies  Orders Added: 1)  Influenza Vaccine MCR [00025]   Influenza Vaccine    Vaccine Type: Fluvax MCR    Site: left deltoid    Mfr: novartis    Dose: 0.5 ml    Route: IM    Given by: Everitt Amber    Exp. Date: 03/2010    Lot #: [id number]

## 2011-01-17 NOTE — Assessment & Plan Note (Signed)
Summary: office visit   Vital Signs:  Patient profile:   60 year old male Weight:      252.08 pounds Pulse rate:   96 / minute Pulse rhythm:   regular BP sitting:   130 / 80  (right arm)  History of Present Illness: Reports increased inability to care for himself , needs assistance with ADl'S, requesting help, as soon as he gets medicaid. He states he needs help with meal preparation, bathing , cleaning his house. he repoprts feeling increasingly disabled. He still smokes 1PPd , would like to quit and has tried in the past. C/o chronic dry cough andshortnessof breath. he denies abdominal pain, change in bm , nausea or emesis. He denies chest pain, pND , orthopnea or leg swelling  Preventive Screening-Counseling & Management  Alcohol-Tobacco     Smoking Cessation Counseling: yes  Allergies: No Known Drug Allergies  Review of Systems      See HPI General:  Complains of fatigue, sleep disorder, and weakness; reports inc inability to care . Eyes:  Denies discharge and red eye. GU:  Denies dysuria, urinary frequency, and urinary hesitancy. MS:  Complains of joint pain, low back pain, mid back pain, and stiffness. Psych:  Complains of anxiety, depression, and mental problems; denies suicidal thoughts/plans, thoughts of violence, and unusual visions or sounds. Endo:  Denies excessive thirst and excessive urination. Heme:  Denies abnormal bruising and bleeding. Allergy:  Complains of seasonal allergies; denies hives or rash and itching eyes.  Physical Exam  General:  alert, appropriate dress, cooperative to examination, good hygiene, and overweight-appearing.   HEENT: No facial asymmetry,  EOMI, No sinus tenderness, TM's Clear, oropharynx  pink and moist. decreased ROM cervical spine  Chest: Clear to auscultation bilaterally. decreased aire entry bilaterally CVS: S1, S2, No murmurs, No S3.   Abd: Soft, Nontender.  rectal: no mass, guaic negative stool. MS: Adequate ROM spine,  hips, shoulders and knees.  Ext: No edema.   CNS: CN 2-12 intact, power tone and sensation normal throughout.   Skin: Intact, no visible lesions or rashes.  Psych: Good eye contact, normal affect.  Memory intact, not anxious or depressed appearing.flat affect   Diabetes Management Exam:    Foot Exam (with socks and/or shoes not present):       Sensory-Monofilament:          Left foot: absent          Right foot: absent       Inspection:          Left foot: normal          Right foot: normal       Nails:          Left foot: fungal infection          Right foot: fungal infection   Impression & Recommendations:  Problem # 1:  NICOTINE ADDICTION (ICD-305.1) Assessment Unchanged  Encouraged smoking cessation and discussed different methods for smoking cessation.   Problem # 2:  HYPERTENSION (ICD-401.9) Assessment: Unchanged  His updated medication list for this problem includes:    Diovan 320 Mg Tabs (Valsartan) .Marland Kitchen... Take 1 tablet by mouth once a day    Amlodipine Besylate 10 Mg Tabs (Amlodipine besylate) .Marland Kitchen... Take 1 tablet by mouth at bedtime    Maxzide-25 37.5-25 Mg Tabs (Triamterene-hctz) .Marland Kitchen... Take 1 tablet by mouth once a day  Orders: Medicare Electronic Prescription [phone]) T-Basic Metabolic Panel [phone]) T-Basic Metabolic Panel [phone]) Medicare Electronic Prescription [phone])  BP today: 130/80 Prior BP: 120/80 (05/27/2010)  Labs Reviewed: K+: 4.2 (05/27/2010) Creat: : 1.13 (05/27/2010)   Chol: 156 (05/27/2010)   HDL: 33 (05/27/2010)   LDL: See Comment mg/dL (18/84/1660)   TG: 630 (05/27/2010)  Problem # 3:  DIABETES MELLITUS, TYPE II (ICD-250.00) Assessment: Comment Only  The following medications were removed from the medication list:    Metformin Hcl 1000 Mg Tabs (Metformin hcl) .Marland Kitchen... Take 1 tablet by mouth two times a day    Glipizide 10 Mg Xr24h-tab (Glipizide) .Marland Kitchen... Take 1 tablet by mouth two times a day His updated medication list for this  problem includes:    Aspirin 325 Mg Tabs (Aspirin) .Marland Kitchen... Take 1 tablet by mouth once a day    Diovan 320 Mg Tabs (Valsartan) .Marland Kitchen... Take 1 tablet by mouth once a day    Glipizide 10 Mg Xr24h-tab (Glipizide) ..... One tablet art breakfast, once daily    Metformin Hcl 1000 Mg Tabs (Metformin hcl) .Marland Kitchen... Take 1 tablet by mouth two times a day  Orders: Medicare Electronic Prescription [phone]) T- Hemoglobin A1C [phone]) T- Hemoglobin A1C (02542-70623) Medicare Electronic Prescription [phone])  Labs Reviewed: Creat: 1.13 (05/27/2010)    Reviewed HgBA1c results: 6.1 (05/27/2010)  6.2 (08/27/2009)  Problem # 4:  OBESITY (ICD-278.00) Assessment: Unchanged  Orders: Medicare Electronic Prescription [phone])  Ht: 73.5 (05/27/2010)   Wt: 252.08 (10/07/2010)   BMI: 33.31 (05/27/2010) therapeutic lifestyle change discussed and encouraged  Problem # 5:  SPECIAL SCREENING FOR MALIGNANT NEOPLASMS COLON (ICD-V76.51) Assessment: Comment Only  Orders: Hemoccult Guaiac-1 spec.(in office) (82270)  Complete Medication List: 1)  Aspirin 325 Mg Tabs (Aspirin) .... Take 1 tablet by mouth once a day 2)  Oscal 500/200 D-3 500-200 Mg-unit Tabs (Calcium-vitamin d) .... Take 1 tablet by mouth two times a day 3)  Hydroxyzine Hcl 25 Mg Tabs (Hydroxyzine hcl) .... Take one tab by mouth at bedtime 4)  Risperdal 4 Mg Tabs (Risperidone) .... Take 1 tab by mouth at bedtime 5)  Diovan 320 Mg Tabs (Valsartan) .... Take 1 tablet by mouth once a day 6)  Amlodipine Besylate 10 Mg Tabs (Amlodipine besylate) .... Take 1 tablet by mouth at bedtime 7)  Maxzide-25 37.5-25 Mg Tabs (Triamterene-hctz) .... Take 1 tablet by mouth once a day 8)  Fioricet 50-325-40 Mg Tabs (Butalbital-apap-caffeine) .... Take 1 tablet by mouth two times a day as needed, max twice weekly 9)  Simvastatin 40 Mg Tabs (Simvastatin) .... One tab by mouth qhs 10)  Multivitamins Tabs (Multiple vitamin) .... Take 1 tablet by mouth once a day 11)   Ibuprofen 800 Mg Tabs (Ibuprofen) .... Take 1 tablet by mouth three times a day 12)  Glipizide 10 Mg Xr24h-tab (Glipizide) .... One tablet art breakfast, once daily 13)  Metformin Hcl 1000 Mg Tabs (Metformin hcl) .... Take 1 tablet by mouth two times a day  Other Orders: T-CBC w/Diff ([id number]) T-PSA [phone]) T-Hepatic Function [phone]) T-Lipid Profile [phone]) Influenza Vaccine MCR (93810)  Patient Instructions: 1)  Please schedule a follow-up appointment in 3.5 months. 2)  Tobacco is very bad for your health and your loved ones! You Should stop smoking!. 3)  Stop Smoking Tips: Choose a Quit date. Cut down before the Quit date. decide what you will do as a substitute when you feel the urge to smoke(gum,toothpick,exercise). 4)  It is important that you exercise regularly at least 20 minutes 5 times a week. If you develop chest pain, have severe difficulty breathing, or feel very tired ,  stop exercising immediately and seek medical attention. 5)  You need to lose weight. Consider a lower calorie diet and regular exercise.  6)  Check your blood sugars regularly. If your readings are usually above 250: or below 70 you should contact our office. 7)  Pls make appt with podiatrist when able, also let us know so we can request CAP for you. 8)  Flu vac and rectal today 9)  BMP prior to visit, ICD-9: 10)  CBC w/ Diff prior to visit, ICD-9:b   today 11)  PSA prior to visit, ICD-9: 12)  HbgA1C prior to visit, ICD-9: 13)  FASTING in 3.5 months 14)  BMP prior to visit, ICD-9: 15)  Hepatic Panel prior to visit, ICD-9: 16)  Lipid Panel prior to visit, ICD-9: 17)  HbgA1C prior to visit, ICD-9: Prescriptions: METFORMIN HCL 1000 MG TABS (METFORMIN HCL) Take 1 tablet by mouth two times a day  #60 x 3   Entered and Authorized by:   Syliva Overman MD   Signed by:   Syliva Overman MD on 10/07/2010   Method used:   Electronically to        [facility] (retail)       [address]       [place], [place]  [address]       Ph: [phone]       Fax: [phone]   RxID:   [id number] GLIPIZIDE 10 MG XR24H-TAB (GLIPIZIDE) one tablet art breakfast, once daily  #30 x 3   Entered and Authorized by:   Syliva Overman MD   Signed by:   Syliva Overman MD on 10/07/2010   Method used:   Printed then faxed to ...       [facility]* (retail)       [address]       [place], [place]  [address]       Ph: [phone]       Fax: [phone]   RxID:   [id number]    Orders Added: 1)  Est. Patient Level IV [[address]010] 2)  Medicare Electronic Prescription [G8553] 3)  T-Basic Metabolic Panel [80048-22910] 4)  T-CBC w/Diff [93235-57322] 5)  T- Hemoglobin A1C [83036-23375] 6)  T-PSA [02542-70623] 7)  T-Basic Metabolic Panel [80048-22910] 8)  T-Hepatic Function [80076-22960] 9)  T-Lipid Profile [80061-22930] 10)  T- Hemoglobin A1C [83036-23375] 11)  Influenza Vaccine MCR [00025] 12)  Hemoccult Guaiac-1 spec.(in office) [82270] 13)  Medicare Electronic Prescription [G8553]   Immunizations Administered:  Influenza Vaccine # 1:    Vaccine Type: Fluvax MCR    Site: right deltoid    Mfr: novartis    Dose: 0.5 ml    Route: IM    Given by: Adella Hare LPN    Exp. Date: 04/2011    Lot #: [id number]    VIS given: 07/12/10 version given October 07, 2010.   Immunizations Administered:  Influenza Vaccine # 1:    Vaccine Type: Fluvax MCR    Site: right deltoid    Mfr: novartis    Dose: 0.5 ml    Route: IM    Given by: Adella Hare LPN    Exp. Date: 04/2011    Lot #: [id number]    VIS given: 07/12/10 version given October 07, 2010.    Laboratory Results  Date/Time Received: October 07, 2010 9:56 AM  Date/Time Reported: October 07, 2010 9:56 AM   Stool - Occult Blood Hemmoccult #1: negative Date: 10/07/2010 Comments: 50201 10L 2/13 118 10/12 [facility] LPN  October 07, 2010 9:57  AM

## 2011-01-17 NOTE — Miscellaneous (Signed)
Summary: refill  Clinical Lists Changes  Medications: Rx of METFORMIN HCL 1000 MG  TABS (METFORMIN HCL) Take 1 tablet by mouth two times a day;  #60 x 2;  Signed;  Entered by: Worthy Keeler LPN;  Authorized by: Syliva Overman MD;  Method used: Electronically to [facility] [address], [place], [place], [place]  [address], Ph: [phone], Fax: [phone]    Prescriptions: METFORMIN HCL 1000 MG  TABS (METFORMIN HCL) Take 1 tablet by mouth two times a day  #60 x 2   Entered by:   Worthy Keeler LPN   Authorized by:   Syliva Overman MD   Signed by:   Worthy Keeler LPN on 03/50/0938   Method used:   Electronically to        [facility] (retail)       [address]       [place], [place]  [address]       Ph: [phone]       Fax: [phone]   RxID:   [id number]

## 2011-01-17 NOTE — Assessment & Plan Note (Signed)
Summary: office visit   Vital Signs:  Patient profile:   60 year old male Height:      73 inches Weight:      262 pounds BSA:     2.42 O2 Sat:      96 % Pulse rate:   84 / minute Pulse rhythm:   regular Resp:     16 per minute BP sitting:   160 / 90  (left arm)  Vitals Entered By: Everitt Amber (February 25, 2009 1:09 PM) Is Patient Diabetic? Yes    History of Present Illness: Patient reports doing well.  Denies any recent fever or chills.  Denies any appetite change or change in bowel movements. Patient denies depression, anxiety or insomnia.He denies hallucinations. He denies polyuria, polydypsia, blurred vision or hypoglycemic episodes. His eye exam is reportedly past due, and he is requesting a podiatry eval aso, he needs assistance with nail care.  Preventive Screening-Counseling & Management     Smoking Status: current     Smoking Cessation Counseling: yes     Packs/Day: 1.0  Allergies (verified): No Known Drug Allergies  Social History:    Packs/Day:  1.0  Review of Systems General:  Denies chills and fever. Eyes:  Denies discharge, eye pain, and red eye. ENT:  Denies earache, hoarseness, nasal congestion, sinus pressure, and sore throat. CV:  Denies chest pain or discomfort, palpitations, shortness of breath with exertion, and swelling of feet. Resp:  Denies cough, sputum productive, and wheezing. GI:  Denies abdominal pain, constipation, diarrhea, nausea, and vomiting. GU:  Denies dysuria, nocturia, urinary frequency, and urinary hesitancy. MS:  Denies joint pain and stiffness. Derm:  Denies itching, lesion(s), and rash. Psych:  Complains of anxiety and depression; denies suicidal thoughts/plans, thoughts of violence, and unusual visions or sounds. Endo:  Denies excessive thirst and excessive urination.  Physical Exam  General:  alert, well-hydrated, and overweight-appearing. HEENT: No facial asymmetry,  EOMI, No sinus tenderness, TM's Clear, oropharynx   pink and moist.   Chest: Clear to auscultation bilaterally.  CVS: S1, S2, No murmurs, No S3.   Abd: Soft, Nontender.  MS: Adequate ROM spine, hips, shoulders and knees.  Ext: No edema.   CNS: CN 2-12 intact, power tone and sensation normal throughout.   Skin: Intact, no visible lesions or rashes.  Psych: Good eye contact, flat affect.  Memory intact, depressed appearing.     Diabetes Management Exam:    Foot Exam (with socks and/or shoes not present):       Sensory-Monofilament:          Left foot: diminished          Right foot: diminished       Inspection:          Left foot: normal          Right foot: normal       Nails:          Left foot: too long          Right foot: too long   Impression & Recommendations:  Problem # 1:  DIABETES MELLITUS, TYPE II (ICD-250.00) Assessment Improved  His updated medication list for this problem includes:    Metformin Hcl 1000 Mg Tabs (Metformin hcl) .Marland Kitchen... Take 1 tablet by mouth two times a day    Aspirin 325 Mg Tabs (Aspirin) .Marland Kitchen... Take 1 tablet by mouth once a day    Glipizide 10 Mg Xr24h-tab (Glipizide) .Marland Kitchen... Take 1 tablet by mouth two  times a day    Diovan 320 Mg Tabs (Valsartan) .Marland Kitchen... Take 1 tablet by mouth once a day  Orders: Glucose, (CBG) (82962) Hemoglobin A1C (83036)  7.1 Ophthalmology Referral (Ophthalmology) Podiatry Referral (Podiatry)  Labs Reviewed: Creat: 1.17 (06/25/2008)    HgBA1c: 8.5 (10/22/2008)  7.5 (01/28/2007)  Problem # 2:  HYPERLIPIDEMIA (ICD-272.4) Assessment: Comment Only  The following medications were removed from the medication list:    Caduet 5-20 Mg Tabs (Amlodipine-atorvastatin) .Marland Kitchen... Take 1 tablet by mouth once a day    Caduet 5-40 Mg Tabs (Amlodipine-atorvastatin) .Marland Kitchen... Take 1 tab by mouth at bedtime His updated medication list for this problem includes:    Fenofibrate 160 Mg Tabs (Fenofibrate) .Marland Kitchen... Take 1 tab by mouth at bedtime    Caduet 10-40 Mg Tabs (Amlodipine-atorvastatin) .Marland Kitchen... Take  1 tab by mouth at bedtime  Orders: T-Hepatic Function [phone]) T-Lipid Profile [phone])  Labs Reviewed: Chol: 154 (06/25/2008)   HDL: 26 (06/25/2008)   LDL: 69 (06/25/2008)   TG: 295 (06/25/2008) SGOT: 24 (06/25/2008)   SGPT: 28 (06/25/2008)  Problem # 3:  OBESITY (ICD-278.00) Assessment: Improved  Orders: T-TSH ([id number])  Problem # 4:  NICOTINE ADDICTION (ICD-305.1) Assessment: Unchanged  Encouraged smoking cessation and discussed different methods for smoking cessation.   Problem # 5:  HYPERTENSION (ICD-401.9) Assessment: Unchanged  The following medications were removed from the medication list:    Caduet 5-20 Mg Tabs (Amlodipine-atorvastatin) .Marland Kitchen... Take 1 tablet by mouth once a day    Caduet 5-40 Mg Tabs (Amlodipine-atorvastatin) .Marland Kitchen... Take 1 tab by mouth at bedtime His updated medication list for this problem includes:    Diovan 320 Mg Tabs (Valsartan) .Marland Kitchen... Take 1 tablet by mouth once a day    Caduet 10-40 Mg Tabs (Amlodipine-atorvastatin) .Marland Kitchen... Take 1 tab by mouth at bedtime  Orders: T-Basic Metabolic Panel [id number])  BP today: 160/90 Prior BP: 160/94 (10/22/2008)  Labs Reviewed: Creat: 1.17 (06/25/2008) Chol: 154 (06/25/2008)   HDL: 26 (06/25/2008)   LDL: 69 (06/25/2008)   TG: 295 (06/25/2008)  Problem # 6:  ACUTE CYSTITIS (ICD-595.0) Assessment: Comment Only  Orders: UA Dipstick W/ Micro (manual) (37106), negative, pt reassured  Problem # 7:  SCHIZOPHRENIA (ICD-295.90) Assessment: Improved pt stable on curreent meds  Complete Medication List: 1)  Metformin Hcl 1000 Mg Tabs (Metformin hcl) .... Take 1 tablet by mouth two times a day 2)  Aspirin 325 Mg Tabs (Aspirin) .... Take 1 tablet by mouth once a day 3)  Oscal 500/200 D-3 500-200 Mg-unit Tabs (Calcium-vitamin d) .... Take 1 tablet by mouth two times a day 4)  Glipizide 10 Mg Xr24h-tab (Glipizide) .... Take 1 tablet by mouth two times a day 5)  Hydroxyzine Hcl 25 Mg Tabs  (Hydroxyzine hcl) .... Take one tab by mouth at bedtime 6)  Risperdal 4 Mg Tabs (Risperidone) .... Take 1 tab by mouth at bedtime 7)  Diovan 320 Mg Tabs (Valsartan) .... Take 1 tablet by mouth once a day 8)  Fenofibrate 160 Mg Tabs (Fenofibrate) .... Take 1 tab by mouth at bedtime 9)  Caduet 10-40 Mg Tabs (Amlodipine-atorvastatin) .... Take 1 tab by mouth at bedtime  Other Orders: T-CBC No Diff ([id number])  Patient Instructions: 1)  Please schedule a follow-up appointment in 4 months. 2)  Tobacco is very bad for your health and your loved ones! You Should stop smoking!. 3)  Stop Smoking Tips: Choose a Quit date. Cut down before the Quit date. decide what you will do as a substitute when  you feel the urge to smoke(gum,toothpick,exercise). 4)  It is important that you exercise regularly at least 40 minutes 5 times a week. If you develop chest pain, have severe difficulty breathing, or feel very tired , stop exercising immediately and seek medical attention. 5)  You need to lose weight. Consider a lower calorie diet and regular exercise.  6)  Cut back the bread to 3 slices daily pls. 7)  NEW dOSE OF CADUET is 10/40 1 at bedtime, STOP THE OLD DOSE. 8)  we will call you wth the referrals to podiatry and opthalmology. Prescriptions: CADUET 10-40 MG TABS (AMLODIPINE-ATORVASTATIN) Take 1 tab by mouth at bedtime  #30 x 4   Entered and Authorized by:   Syliva Overman MD   Signed by:   Syliva Overman MD on 02/25/2009   Method used:   Electronically to        [facility] (retail)       [address]       [place], [place]  [address]       Ph: [phone]       Fax: [phone]   RxID:   [id number]    Laboratory Results   Urine Tests  Date/Time Received: February 25, 2009  Date/Time Reported: February 25, 2009 2pm  Routine Urinalysis   Color: lt. yellow Appearance: Clear Glucose: negative   (Normal Range: Negative) Bilirubin: negative    (Normal Range: Negative) Ketone: negative   (Normal Range: Negative) Spec. Gravity: 1.010   (Normal Range: 1.003-1.035) Blood: negative   (Normal Range: Negative) pH: 6.0   (Normal Range: 5.0-8.0) Protein: negative   (Normal Range: Negative) Urobilinogen: 0.2   (Normal Range: 0-1) Nitrite: negative   (Normal Range: Negative) Leukocyte Esterace: negative   (Normal Range: Negative)     Blood Tests   Date/Time Received: February 25, 2009 2pm Date/Time Reported: February 25, 2009 2pm  Glucose (random): 164 mg/dL   (Normal Range: 84-132) HGBA1C: 7.1%   (Normal Range: Non-Diabetic - 3-6%   Control Diabetic - 6-8%)

## 2011-01-17 NOTE — Letter (Signed)
Summary: 1st Missed Appt.  [facility]  [address]   [place], [place] [address]   Phone: [phone]  Fax: [phone]    January 05, 2010  MRN: [id number]  Matthew Molina [address] [place], [place]  [address]  Dear [name],  At [facility], we make every attempt to fit patients into our schedule by reserving several appointment slots for same-day appointments.  However, we cannot always make appointments for patients the same day they are calling.  At the end of the day, we look back at our schedule and find that because of last-minute cancellations and patients not showing up for their scheduled appointments, we have several appointment slots that are left open and could have been used by another person who really needed it.  In the past, you may have been one of the patients who could not get in when you needed to.  But recently, you were one of the patients with an appointment that you didn't show up for or canceled too late for [place] to fill it.  We choose not to charge no-show or last minute cancellation fees to our patients, like many other offices do.  We do not wish to institute that policy and hope we never have to.  However, we kindly request that you assist [place] by providing at least 24 hours' notice if you can't make your appointment.  If no-shows or late cancellations become habitual (i.e. Three or more in a one-year period), we may terminate the physician-patient relationship.    Thank you for your consideration and cooperation.   Altamease Oiler

## 2011-01-17 NOTE — Miscellaneous (Signed)
Summary: Home Care Report  Home Care Report   Imported By: Lind Guest 08/14/2008 16:07:12  _____________________________________________________________________  External Attachment:    Type:   Image     Comment:   External Document

## 2011-01-17 NOTE — Assessment & Plan Note (Signed)
Summary: OV   Vital Signs:  Patient profile:   60 year old male Height:      73.5 inches Weight:      255 pounds BMI:     33.31 O2 Sat:      98 % Pulse rate:   91 / minute Pulse rhythm:   regular Resp:     16 per minute BP sitting:   120 / 80  (left arm) Cuff size:   large  Vitals Entered By: Everitt Amber LPN (May 27, 2010 9:27 AM)  Nutrition Counseling: Patient's BMI is greater than 25 and therefore counseled on weight management options. CC: wants to know if he could have arthritis in his upper arms on both sides and neck. Sometimes it aches really bad   CC:  wants to know if he could have arthritis in his upper arms on both sides and neck. Sometimes it aches really bad.  History of Present Illness: Pt reports he has been doing fairly well. He has not been testing sugars regularly, but has no symptoms of uncontrolled blood sugar. He c/o increased neck pain and stiffness, radiating down arms. He is nopt as active as in the past but intends to work on this. Heis nicotine use is unchanged  Preventive Screening-Counseling & Management  Alcohol-Tobacco     Smoking Cessation Counseling: yes  Current Medications (verified): 1)  Metformin Hcl 1000 Mg  Tabs (Metformin Hcl) .... Take 1 Tablet By Mouth Two Times A Day 2)  Aspirin 325 Mg  Tabs (Aspirin) .... Take 1 Tablet By Mouth Once A Day 3)  Oscal 500/200 D-3 500-200 Mg-Unit  Tabs (Calcium-Vitamin D) .... Take 1 Tablet By Mouth Two Times A Day 4)  Glipizide 10 Mg Xr24h-Tab (Glipizide) .... Take 1 Tablet By Mouth Two Times A Day 5)  Hydroxyzine Hcl 25 Mg Tabs (Hydroxyzine Hcl) .... Take One Tab By Mouth At Bedtime 6)  Risperdal 4 Mg Tabs (Risperidone) .... Take 1 Tab By Mouth At Bedtime 7)  Diovan 320 Mg Tabs (Valsartan) .... Take 1 Tablet By Mouth Once A Day 8)  Amlodipine Besylate 10 Mg Tabs (Amlodipine Besylate) .... Take 1 Tablet By Mouth At Bedtime 9)  Maxzide-25 37.5-25 Mg Tabs (Triamterene-Hctz) .... Take 1 Tablet By Mouth  Once A Day 10)  Fioricet 50-325-40 Mg Tabs (Butalbital-Apap-Caffeine) .... Take 1 Tablet By Mouth Two Times A Day As Needed, Max Twice Weekly 11)  Simvastatin 40 Mg Tabs (Simvastatin) .... One Tab By Mouth Qhs 12)  Multivitamins  Tabs (Multiple Vitamin) .... Take 1 Tablet By Mouth Once A Day  Allergies (verified): No Known Drug Allergies  Review of Systems      See HPI General:  Complains of fatigue; denies chills and fever. Eyes:  Complains of vision loss-both eyes; denies discharge, eye pain, and red eye; had eye exam last month, states his vision is worse in the left eye ,he has a cataract. ENT:  Denies nasal congestion, nosebleeds, and sinus pressure. CV:  Denies chest pain or discomfort, difficulty breathing while lying down, and swelling of feet. Resp:  Complains of cough and shortness of breath; denies sputum productive and wheezing; chronic smoker's cough. GU:  Denies dysuria, nocturia, and urinary frequency. MS:  Complains of joint pain; neck pain radiating to both shoulders and upper arms x 3 months, no weakness and numbness. Derm:  Denies itching, lesion(s), and rash; toenails thickened and overgrown, [podiatry recommended strongly. Neuro:  Denies headaches and seizures. Psych:  Complains of anxiety, easily  angered, mental problems, and unusual visions or sounds; denies sense of great danger, thoughts of violence, and thoughts /plans of harming others; chronic and controlled on meds. Endo:  Denies cold intolerance, excessive hunger, excessive thirst, excessive urination, heat intolerance, polyuria, and weight change. Heme:  Denies abnormal bruising and bleeding. Allergy:  Denies hives or rash and itching eyes.  Physical Exam  General:  alert, appropriate dress, cooperative to examination, good hygiene, and overweight-appearing.   HEENT: No facial asymmetry,  EOMI, No sinus tenderness, TM's Clear, oropharynx  pink and moist. decreased ROM cervical spine  Chest: Clear to  auscultation bilaterally. decreased aire entry bilaterally CVS: S1, S2, No murmurs, No S3.   Abd: Soft, Nontender.  MS: Adequate ROM spine, hips, shoulders and knees.  Ext: No edema.   CNS: CN 2-12 intact, power tone and sensation normal throughout.   Skin: Intact, no visible lesions or rashes.  Psych: Good eye contact, normal affect.  Memory intact, not anxious or depressed appearing.flat affect    Impression & Recommendations:  Problem # 1:  NECK PAIN, CHRONIC (ICD-723.1) Assessment Deteriorated  His updated medication list for this problem includes:    Aspirin 325 Mg Tabs (Aspirin) .Marland Kitchen... Take 1 tablet by mouth once a day    Fioricet 50-325-40 Mg Tabs (Butalbital-apap-caffeine) .Marland Kitchen... Take 1 tablet by mouth two times a day as needed, max twice weekly    Ibuprofen 800 Mg Tabs (Ibuprofen) .Marland Kitchen... Take 1 tablet by mouth three times a day  Orders: Depo- Medrol 80mg  (J1040) Ketorolac-Toradol 15mg  (I3474) Admin of Therapeutic Inj  intramuscular or subcutaneous (25956)  Problem # 2:  SPECIAL SCREENING FOR MALIGNANT NEOPLASMS COLON (ICD-V76.51) Assessment: Comment Only  Orders: Gastroenterology Referral (GI), COLONSCOPY PAST DUE, PT AGREES TO HAVE it in Sept  Problem # 3:  NICOTINE ADDICTION (ICD-305.1) Assessment: Unchanged  Encouraged smoking cessation and discussed different methods for smoking cessation.   Problem # 4:  DIABETES MELLITUS, TYPE II (ICD-250.00) Assessment: Comment Only  His updated medication list for this problem includes:    Metformin Hcl 1000 Mg Tabs (Metformin hcl) .Marland Kitchen... Take 1 tablet by mouth two times a day    Aspirin 325 Mg Tabs (Aspirin) .Marland Kitchen... Take 1 tablet by mouth once a day    Glipizide 10 Mg Xr24h-tab (Glipizide) .Marland Kitchen... Take 1 tablet by mouth two times a day    Diovan 320 Mg Tabs (Valsartan) .Marland Kitchen... Take 1 tablet by mouth once a day  Orders: T- Hemoglobin A1C ([id number])  Labs Reviewed: Creat: 1.09 (08/27/2009)    Reviewed HgBA1c results: 6.2  (08/27/2009)  7.1 (02/25/2009)  Problem # 5:  HYPERTENSION (ICD-401.9) Assessment: Improved  His updated medication list for this problem includes:    Diovan 320 Mg Tabs (Valsartan) .Marland Kitchen... Take 1 tablet by mouth once a day    Amlodipine Besylate 10 Mg Tabs (Amlodipine besylate) .Marland Kitchen... Take 1 tablet by mouth at bedtime    Maxzide-25 37.5-25 Mg Tabs (Triamterene-hctz) .Marland Kitchen... Take 1 tablet by mouth once a day  Orders: T-Basic Metabolic Panel [phone])  BP today: 120/80 Prior BP: 132/76 (03/01/2010)  Labs Reviewed: K+: 4.1 (08/27/2009) Creat: : 1.09 (08/27/2009)   Chol: 140 (08/27/2009)   HDL: 34 (08/27/2009)   LDL: 45 (08/27/2009)   TG: 307 (08/27/2009)  Problem # 6:  HYPERLIPIDEMIA (ICD-272.4) Assessment: Comment Only  His updated medication list for this problem includes:    Simvastatin 40 Mg Tabs (Simvastatin) ..... One tab by mouth qhs  Orders: T-Hepatic Function [phone]) T-Lipid Profile [phone])  Labs  Reviewed: SGOT: 33 (08/27/2009)   SGPT: 54 (08/27/2009).pasdt due, labs today   HDL:34 (08/27/2009), 26 (06/25/2008)  LDL:45 (08/27/2009), 69 (06/25/2008)  Chol:140 (08/27/2009), 154 (06/25/2008)  Trig:307 (08/27/2009), 295 (06/25/2008)  Complete Medication List: 1)  Metformin Hcl 1000 Mg Tabs (Metformin hcl) .... Take 1 tablet by mouth two times a day 2)  Aspirin 325 Mg Tabs (Aspirin) .... Take 1 tablet by mouth once a day 3)  Oscal 500/200 D-3 500-200 Mg-unit Tabs (Calcium-vitamin d) .... Take 1 tablet by mouth two times a day 4)  Glipizide 10 Mg Xr24h-tab (Glipizide) .... Take 1 tablet by mouth two times a day 5)  Hydroxyzine Hcl 25 Mg Tabs (Hydroxyzine hcl) .... Take one tab by mouth at bedtime 6)  Risperdal 4 Mg Tabs (Risperidone) .... Take 1 tab by mouth at bedtime 7)  Diovan 320 Mg Tabs (Valsartan) .... Take 1 tablet by mouth once a day 8)  Amlodipine Besylate 10 Mg Tabs (Amlodipine besylate) .... Take 1 tablet by mouth at bedtime 9)  Maxzide-25 37.5-25 Mg  Tabs (Triamterene-hctz) .... Take 1 tablet by mouth once a day 10)  Fioricet 50-325-40 Mg Tabs (Butalbital-apap-caffeine) .... Take 1 tablet by mouth two times a day as needed, max twice weekly 11)  Simvastatin 40 Mg Tabs (Simvastatin) .... One tab by mouth qhs 12)  Multivitamins Tabs (Multiple vitamin) .... Take 1 tablet by mouth once a day 13)  Prednisone (pak) 5 Mg Tabs (Prednisone) .... Use as directed 14)  Ibuprofen 800 Mg Tabs (Ibuprofen) .... Take 1 tablet by mouth three times a day  Patient Instructions: 1)  Please schedule a follow-up appointment in 4 months. 2)  Tobacco is very bad for your health and your loved ones! You Should stop smoking!. 3)  Stop Smoking Tips: Choose a Quit date. Cut down before the Quit date. decide what you will do as a substitute when you feel the urge to smoke(gum,toothpick,exercise). 4)  It is important that you exercise regularly at least 20 minutes 5 times a week. If you develop chest pain, have severe difficulty breathing, or feel very tired , stop exercising immediately and seek medical attention. 5)  You need to lose weight. Consider a lower calorie diet and regular exercise.  6)  BMP prior to visit, ICD-9: 7)  Hepatic Panel prior to visit, ICD-9: 8)  Lipid Panel prior to visit, ICD-9: 9)  HbgA1C prior to visit, ICD-9: 10)  Med is sent in for the neck and shoulder pain, and you will get injections in the office also, after you finish the prescription ,you can use  tylenol arthritis. Prescriptions: SIMVASTATIN 40 MG TABS (SIMVASTATIN) one tab by mouth qhs  #30 x 4   Entered by:   Everitt Amber LPN   Authorized by:   Syliva Overman MD   Signed by:   Everitt Amber LPN on 60/45/4098   Method used:   Electronically to        [facility]* (retail)       [address]       [place], [place]  [address]       Ph: [phone]       Fax: [phone]   RxID:   [id number] FIORICET 50-325-40 MG TABS  (BUTALBITAL-APAP-CAFFEINE) Take 1 tablet by mouth two times a day as needed, max twice weekly  #30 x 4   Entered by:   Everitt Amber LPN   Authorized by:   Syliva Overman  MD   Signed by:   Everitt Amber LPN on 16/09/9603   Method used:   Electronically to        [facility]* (retail)       [address] [place], [place]  [address]       Ph: [phone]       Fax: [phone]   RxID:   [id number] NUUVOZD-66 37.5-25 MG TABS (TRIAMTERENE-HCTZ) Take 1 tablet by mouth once a day  #30 Each x 4   Entered by:   Everitt Amber LPN   Authorized by:   Syliva Overman MD   Signed by:   Everitt Amber LPN on 44/02/4741   Method used:   Electronically to        [facility]* (retail)       [address] [place], [place]  [address]       Ph: [phone]       Fax: [phone]   RxID:   [id number] GLIPIZIDE 10 MG XR24H-TAB (GLIPIZIDE) Take 1 tablet by mouth two times a day  #60 x 4   Entered by:   Everitt Amber LPN   Authorized by:   Syliva Overman MD   Signed by:   Everitt Amber LPN on 02/54/2706   Method used:   Electronically to        [facility]* (retail)       [address] [place], [place]  [address]       Ph: [phone]       Fax: [phone]   RxID:   [id number] DIOVAN 320 MG TABS (VALSARTAN) Take 1 tablet by mouth once a day  #30 Each x 4   Entered by:   Everitt Amber LPN   Authorized by:   Syliva Overman MD   Signed by:   Everitt Amber LPN on 29/93/7169   Method used:   Electronically to        [facility]* (retail)       [address] [place], [place]  [address]       Ph: [phone]       Fax: [phone]   RxID:   [id number] METFORMIN HCL 1000 MG  TABS (METFORMIN HCL) Take 1 tablet by mouth two times a day  #60 Each x 4   Entered by:   Everitt Amber LPN   Authorized by:   Syliva Overman MD   Signed  by:   Everitt Amber LPN on 19/50/9326   Method used:   Electronically to        [facility]* (retail)       [address]       [place], [place]  [address]       Ph: [phone]       Fax: [phone]   RxID:   [id number] IBUPROFEN 800 MG TABS (IBUPROFEN) Take 1 tablet by mouth three times a day  #30 x 0   Entered and Authorized by:   Syliva Overman MD   Signed by:   Syliva Overman MD on 05/27/2010   Method used:   Electronically to  [facility]* (retail)       [address]       [place], [place]  [address]       Ph: [phone]       Fax: [phone]   RxID:   [id number] PREDNISONE (PAK) 5 MG TABS (PREDNISONE) Use as directed  #21 x 0   Entered and Authorized by:   Syliva Overman MD   Signed by:   Syliva Overman MD on 05/27/2010   Method used:   Electronically to        [facility]* (retail)       [address]       [place], [place]  [address]       Ph: [phone]       Fax: [phone]   RxID:   [id number]   Prevention & Chronic Care Immunizations   Influenza vaccine: Fluvax MCR  (09/23/2009)    Tetanus booster: 06/28/2004: Historical    Pneumococcal vaccine: Pneumovax  (08/27/2009)    H. zoster vaccine: Not documented  Colorectal Screening   Hemoccult: Not documented    Colonoscopy: Abnormal  (05/25/1999)  Other Screening   PSA: 1.54  (06/25/2008)   Smoking status: current  (02/25/2009)   Smoking cessation counseling: yes  (05/27/2010)  Diabetes Mellitus   HgbA1C: 6.2  (08/27/2009)    Eye exam: Not documented    Foot exam: yes  (03/01/2010)   High risk foot: Not documented   Foot care education: Not documented    Urine microalbumin/creatinine ratio: 13.0  (08/27/2009)  Lipids   Total Cholesterol: 140  (08/27/2009)   LDL: 45  (08/27/2009)   LDL Direct: Not documented   HDL: 34  (08/27/2009)   Triglycerides: 307   (08/27/2009)    SGOT (AST): 33  (08/27/2009)   SGPT (ALT): 54  (08/27/2009)   Alkaline phosphatase: 56  (08/27/2009)   Total bilirubin: 0.4  (08/27/2009)  Hypertension   Last Blood Pressure: 120 / 80  (05/27/2010)   Serum creatinine: 1.09  (08/27/2009)   Serum potassium 4.1  (08/27/2009)  Self-Management Support :    Diabetes self-management support: Not documented    Hypertension self-management support: Not documented    Lipid self-management support: Not documented        Medication Administration  Injection # 1:    Medication: Depo- Medrol 80mg     Diagnosis: NECK PAIN, CHRONIC (ICD-723.1)    Route: IM    Site: RUOQ gluteus    Exp Date: 01/2011    Lot #: [id number]    Mfr: [employer]    Comments: 80mg  given     Patient tolerated injection without complications    Given by: Everitt Amber LPN (May 27, 2010 11:34 AM)  Injection # 2:    Medication: Ketorolac-Toradol 15mg     Diagnosis: NECK PAIN, CHRONIC (ICD-723.1)    Route: IM    Site: LUOQ gluteus    Exp Date: 01/2012    Lot #: [id number]     Mfr: hospira    Comments: 60mg  given     Patient tolerated injection without complications    Given by: Everitt Amber LPN (May 27, 2010 11:35 AM)  Orders Added: 1)  Est. Patient Level IV [99214] 2)  T-Basic Metabolic Panel [88416-60630] 3)  T-Hepatic Function [80076-22960] 4)  T-Lipid Profile [80061-22930] 5)  T- Hemoglobin A1C [83036-23375] 6)  Gastroenterology Referral [GI] 7)  Depo- Medrol 80mg  [  J1040] 8)  Ketorolac-Toradol 15mg  [J1885] 9)  Admin of Therapeutic Inj  intramuscular or subcutaneous [[address]]

## 2011-01-17 NOTE — Procedures (Signed)
Summary: Gastroenterology  Gastroenterology   Imported By: Lind Guest 09/06/2010 11:25:17  _____________________________________________________________________  External Attachment:    Type:   Image     Comment:   External Document

## 2011-01-17 NOTE — Assessment & Plan Note (Signed)
Summary: office visit   Vital Signs:  Patient profile:   60 year old male Height:      73 inches Weight:      249.13 pounds BMI:     32.99 O2 Sat:      98 % Pulse rate:   85 / minute Pulse rhythm:   regular Resp:     16 per minute BP sitting:   134 / 84  (left arm) Cuff size:   large  Vitals Entered By: Everitt Amber (August 27, 2009 8:59 AM)  Nutrition Counseling: Patient's BMI is greater than 25 and therefore counseled on weight management options. CC: Follow up chronic problems Is Patient Diabetic? Yes  Pain Assessment Patient in pain? no        CC:  Follow up chronic problems.  History of Present Illness: Patient reports doing well.  Denies any recent fever or chills.  Denies any appetite change or change in bowel movements. Patient denies depression, anxiety or insomnia.He denies hllucinations, or mental breakdown. The pt continues to smoke with no plans of quitting. He tests daily and reports fasting sugars to be seldom ove 130.  Preventive Screening-Counseling & Management  Alcohol-Tobacco     Smoking Cessation Counseling: yes  Current Medications (verified): 1)  Metformin Hcl 1000 Mg  Tabs (Metformin Hcl) .... Take 1 Tablet By Mouth Two Times A Day 2)  Aspirin 325 Mg  Tabs (Aspirin) .... Take 1 Tablet By Mouth Once A Day 3)  Oscal 500/200 D-3 500-200 Mg-Unit  Tabs (Calcium-Vitamin D) .... Take 1 Tablet By Mouth Two Times A Day 4)  Glipizide 10 Mg Xr24h-Tab (Glipizide) .... Take 1 Tablet By Mouth Two Times A Day 5)  Hydroxyzine Hcl 25 Mg Tabs (Hydroxyzine Hcl) .... Take One Tab By Mouth At Bedtime 6)  Risperdal 4 Mg Tabs (Risperidone) .... Take 1 Tab By Mouth At Bedtime 7)  Diovan 320 Mg Tabs (Valsartan) .... Take 1 Tablet By Mouth Once A Day 8)  Amlodipine Besylate 10 Mg Tabs (Amlodipine Besylate) .... Take 1 Tablet By Mouth At Bedtime 9)  Maxzide-25 37.5-25 Mg Tabs (Triamterene-Hctz) .... Take 1 Tablet By Mouth Once A Day 10)  Fioricet 50-325-40 Mg Tabs  (Butalbital-Apap-Caffeine) .... Take 1 Tablet By Mouth Two Times A Day As Needed, Max Twice Weekly 11)  Lipitor 40 Mg Tabs (Atorvastatin Calcium) .... Take 1 Tab By Mouth At Bedtime  Allergies (verified): No Known Drug Allergies  Review of Systems      See HPI General:  Complains of fatigue; denies chills, fever, and sleep disorder. ENT:  Denies hoarseness, nasal congestion, nosebleeds, and sinus pressure. CV:  Denies chest pain or discomfort, difficulty breathing while lying down, palpitations, and swelling of feet. Resp:  Denies cough and sputum productive. GI:  Denies abdominal pain, constipation, diarrhea, nausea, and vomiting. GU:  Denies dysuria, urinary frequency, and urinary hesitancy. MS:  Denies joint pain and stiffness. Derm:  Denies itching and rash. Neuro:  Denies headaches and sensation of room spinning. Psych:  See HPI; Complains of anxiety, depression, and mental problems; denies sense of great danger, suicidal thoughts/plans, thoughts of violence, and unusual visions or sounds. Endo:  See HPI; Denies cold intolerance, excessive hunger, excessive thirst, excessive urination, heat intolerance, polyuria, and weight change. Heme:  Denies abnormal bruising and bleeding. Allergy:  Denies hives or rash and itching eyes.  Physical Exam  General:  alert, well-hydrated, and overweight-appearing. HEENT: No facial asymmetry,  EOMI, No sinus tenderness, TM's Clear, oropharynx  pink  and moist.   Chest: Clear to auscultation bilaterally.  CVS: S1, S2, No murmurs, No S3.   Abd: Soft, Nontender.  MS: Adequate ROM spine, hips, shoulders and knees.  Ext: No edema.   CNS: CN 2-12 intact, power tone and sensation normal throughout.   Skin: Intact, no visible lesions or rashes.  Psych: Good eye contact, flat affect.  Memory loss, not anxious or depressed appearing.     Diabetes Management Exam:    Foot Exam (with socks and/or shoes not present):       Sensory-Monofilament:           Left foot: diminished          Right foot: diminished       Inspection:          Left foot: normal          Right foot: normal       Nails:          Left foot: thickened          Right foot: thickened   Impression & Recommendations:  Problem # 1:  HEADACHE (ICD-784.0) Assessment Unchanged  His updated medication list for this problem includes:    Aspirin 325 Mg Tabs (Aspirin) .Marland Kitchen... Take 1 tablet by mouth once a day    Fioricet 50-325-40 Mg Tabs (Butalbital-apap-caffeine) .Marland Kitchen... Take 1 tablet by mouth two times a day as needed, max twice weekly  Problem # 2:  NICOTINE ADDICTION (ICD-305.1) Assessment: Unchanged  Encouraged smoking cessation and discussed different methods for smoking cessation.   Problem # 3:  HYPERLIPIDEMIA (ICD-272.4) Assessment: Comment Only  The following medications were removed from the medication list:    Fenofibrate 160 Mg Tabs (Fenofibrate) .Marland Kitchen... Take 1 tab by mouth at bedtime His updated medication list for this problem includes:    Lipitor 40 Mg Tabs (Atorvastatin calcium) .Marland Kitchen... Take 1 tab by mouth at bedtime  Orders: T-Lipid Profile [phone])  Labs Reviewed: SGOT: 24 (06/25/2008)   SGPT: 28 (06/25/2008)   HDL:26 (06/25/2008), 31 (01/28/2007)  LDL:69 (06/25/2008), 81 (01/28/2007)  Chol:154 (06/25/2008), 155 (01/28/2007)  Trig:295 (06/25/2008), 216 (01/28/2007)  Problem # 4:  OBESITY (ICD-278.00) Assessment: Improved  Ht: 73 (08/27/2009)   Wt: 249.13 (08/27/2009)   BMI: 32.99 (08/27/2009)  Problem # 5:  HYPERTENSION (ICD-401.9) Assessment: Improved  His updated medication list for this problem includes:    Diovan 320 Mg Tabs (Valsartan) .Marland Kitchen... Take 1 tablet by mouth once a day    Amlodipine Besylate 10 Mg Tabs (Amlodipine besylate) .Marland Kitchen... Take 1 tablet by mouth at bedtime    Maxzide-25 37.5-25 Mg Tabs (Triamterene-hctz) .Marland Kitchen... Take 1 tablet by mouth once a day  Orders: T-Basic Metabolic Panel [phone])  BP today: 134/84 Prior BP:  140/90 (03/08/2009)  Labs Reviewed: K+: 3.9 (06/25/2008) Creat: : 1.17 (06/25/2008)   Chol: 154 (06/25/2008)   HDL: 26 (06/25/2008)   LDL: 69 (06/25/2008)   TG: 295 (06/25/2008)  Problem # 6:  DIABETES MELLITUS, TYPE II (ICD-250.00) Assessment: Improved  His updated medication list for this problem includes:    Metformin Hcl 1000 Mg Tabs (Metformin hcl) .Marland Kitchen... Take 1 tablet by mouth two times a day    Aspirin 325 Mg Tabs (Aspirin) .Marland Kitchen... Take 1 tablet by mouth once a day    Glipizide 10 Mg Xr24h-tab (Glipizide) .Marland Kitchen... Take 1 tablet by mouth two times a day    Diovan 320 Mg Tabs (Valsartan) .Marland Kitchen... Take 1 tablet by mouth once a day  Orders: Glucose, (  CBG) ([address]) Hemoglobin A1C (83036) T-Urine Microalbumin w/creat. ratio [phone])  Labs Reviewed: Creat: 1.17 (06/25/2008)    Reviewed HgBA1c results: 6.2 (08/27/2009)  7.1 (02/25/2009)  Complete Medication List: 1)  Metformin Hcl 1000 Mg Tabs (Metformin hcl) .... Take 1 tablet by mouth two times a day 2)  Aspirin 325 Mg Tabs (Aspirin) .... Take 1 tablet by mouth once a day 3)  Oscal 500/200 D-3 500-200 Mg-unit Tabs (Calcium-vitamin d) .... Take 1 tablet by mouth two times a day 4)  Glipizide 10 Mg Xr24h-tab (Glipizide) .... Take 1 tablet by mouth two times a day 5)  Hydroxyzine Hcl 25 Mg Tabs (Hydroxyzine hcl) .... Take one tab by mouth at bedtime 6)  Risperdal 4 Mg Tabs (Risperidone) .... Take 1 tab by mouth at bedtime 7)  Diovan 320 Mg Tabs (Valsartan) .... Take 1 tablet by mouth once a day 8)  Amlodipine Besylate 10 Mg Tabs (Amlodipine besylate) .... Take 1 tablet by mouth at bedtime 9)  Maxzide-25 37.5-25 Mg Tabs (Triamterene-hctz) .... Take 1 tablet by mouth once a day 10)  Fioricet 50-325-40 Mg Tabs (Butalbital-apap-caffeine) .... Take 1 tablet by mouth two times a day as needed, max twice weekly 11)  Lipitor 40 Mg Tabs (Atorvastatin calcium) .... Take 1 tab by mouth at bedtime  Other Orders: T-Hepatic Function  [phone]) T-CBC w/Diff [phone]) T-TSH [phone]) Pneumococcal Vaccine (10272) Admin 1st Vaccine (53664)  Patient Instructions: 1)  Please schedule a follow-up appointment in 4 months. 2)  Tobacco is very bad for your health and your loved ones! You Should stop smoking!. 3)  Stop Smoking Tips: Choose a Quit date. Cut down before the Quit date. decide what you will do as a substitute when you feel the urge to smoke(gum,toothpick,exercise). 4)  It is important that you exercise regularly at least 60 minutes 5 times a week. If you develop chest pain, have severe difficulty breathing, or feel very tired , stop exercising immediately and seek medical attention. 5)  You need to lose weight. Consider a lower calorie diet and regular exercise. cONGRATS`on weight loss, pls keep it up. 6)  BMP prior to visit, ICD-9: 7)  Hepatic Panel prior to visit, ICD-9: 8)  Lipid Panel prior to visit, ICD-9: 9)  TSH prior to visit, ICD-9: 10)  CBC w/ Diff prior to visit, ICD-9: 11)  Urine Microalbumin prior to visit, ICD-9: 12)  pneumonia vac today  Laboratory Results   Blood Tests   Date/Time Received: August 27, 2009  Date/Time Reported: August 27, 2009   Glucose (fasting): 162 mg/dL   (Normal Range: 40-347) HGBA1C: 6.2%   (Normal Range: Non-Diabetic - 3-6%   Control Diabetic - 6-8%)      Immunizations Administered:  Pneumonia Vaccine:    Vaccine Type: Pneumovax    Site: right deltoid    Mfr: Merck    Dose: 0.5 ml    Route: IM    Given by: Worthy Keeler LPN    Exp. Date: 11/10/2010    Lot #: [id number]    VIS given: 07/15/96 version given August 27, 2009.

## 2011-01-17 NOTE — Miscellaneous (Signed)
Summary: Home Care Report  Home Care Report   Imported By: Lind Guest 03/07/2010 09:18:15  _____________________________________________________________________  External Attachment:    Type:   Image     Comment:   External Document

## 2011-01-17 NOTE — Miscellaneous (Signed)
Summary: refill  Clinical Lists Changes  Medications: Rx of GLIPIZIDE 10 MG XR24H-TAB (GLIPIZIDE) Take 1 tablet by mouth two times a day;  #60 x 5;  Signed;  Entered by: Worthy Keeler LPN;  Authorized by: Syliva Overman MD;  Method used: Electronically to [facility]*, [address], [place], [place], [place]  [address], Ph: [phone], Fax: [phone]    Prescriptions: GLIPIZIDE 10 MG XR24H-TAB (GLIPIZIDE) Take 1 tablet by mouth two times a day  #60 x 5   Entered by:   Worthy Keeler LPN   Authorized by:   Syliva Overman MD   Signed by:   Worthy Keeler LPN on 13/07/6577   Method used:   Electronically to        [facility]* (retail)       [address]       [place], [place]  [address]       Ph: [phone]       Fax: [phone]   RxID:   [id number]

## 2011-01-17 NOTE — Miscellaneous (Signed)
  Clinical Lists Changes  Medications: Added new medication of CADUET 5-40 MG TABS (AMLODIPINE-ATORVASTATIN) Take 1 tab by mouth at bedtime - Signed Removed medication of ANTARA 130 MG  CAPS (FENOFIBRATE MICRONIZED) Take 1 tab by mouth at bedtime Rx of CADUET 5-40 MG TABS (AMLODIPINE-ATORVASTATIN) Take 1 tab by mouth at bedtime;  #30 x 3;  Signed;  Entered by: Syliva Overman MD;  Authorized by: Syliva Overman MD;  Method used: Electronically to [facility]*, [address] [place], [place], [place]  [address], Ph: [phone], Fax: [phone]    Prescriptions: CADUET 5-40 MG TABS (AMLODIPINE-ATORVASTATIN) Take 1 tab by mouth at bedtime  #30 x 3   Entered and Authorized by:   Syliva Overman MD   Signed by:   Syliva Overman MD on 01/15/2009   Method used:   Electronically to        [facility]* (retail)       [address]       [place], [place]  [address]       Ph: [phone]       Fax: [phone]   RxID:   [id number]

## 2011-01-17 NOTE — Assessment & Plan Note (Signed)
Summary: CHECK UP   Vital Signs:  Patient profile:   60 year old male Height:      73.5 inches Weight:      257.75 pounds BMI:     33.67 O2 Sat:      100 % on Room air Pulse rate:   80 / minute Resp:     20 per minute BP supine:   132 / 76  (right arm) BP sitting:   138 / 82  Serial Vital Signs/Assessments:  Time      Position  BP       Pulse  Resp  Temp     By                     138/82                         Esperanza Sheets PA  CC: Phlegm in chest, personal care services, and regular check up.   CC:  Phlegm in chest, personal care services, and and regular check up.Marland Kitchen  History of Present Illness: Pt c/o chest congestion x approx 1 mos.  Nonprod cough.Some nasal congestion & post nasal drainage too. No fever.  Pt smokes 1 - 1 1/2 ppd.  Pt spoke with a home health care agency (? [facility]).  Pt states he needs help at home with personal care such as bathing, laundry, house work, & meal preparation.  He mostly eats sandwiches because they are easy to make. He also has problems keeping his dr appts, including here at this office, and with his specialists.  He has schizophrenia.  Hasn't seen psych in over a year due to temporary loss of ins.  He feels that his mental condition is stable and doesn't need to rtn to psych at this time.  Pt is diabetic.  Is not testing his blood sugar at home.  Is taking his medications daily.   Was previously seeing a podiatrist for foot care.  Has not been x approx 1 yr.  Again, due to loss of Medicaid ins over the last yr.  Now that has Medicaid again wants to rtn.  He is unable to trim his own nails, etc.    Allergies: No Known Drug Allergies  Past History:  Past medical history reviewed for relevance to current acute and chronic problems.  Past Medical History: Reviewed history from 06/02/2008 and no changes required.   NICOTINE ADDICTION (ICD-305.1) SCHIZOPHRENIA (ICD-295.90) HYPERLIPIDEMIA (ICD-272.4) OBESITY  (ICD-278.00) HYPERTENSION (ICD-401.9) DIABETES MELLITUS, TYPE II (ICD-250.00)  Review of Systems General:  Denies chills and fever. ENT:  Complains of nasal congestion and postnasal drainage; denies earache, sinus pressure, and sore throat. CV:  Denies chest pain or discomfort. Resp:  Complains of cough; denies shortness of breath, sputum productive, and wheezing. Psych:  Complains of mental problems. Heme:  Denies enlarge lymph nodes.  Physical Exam  General:  alert, appropriate dress, cooperative to examination, good hygiene, and overweight-appearing.   Head:  Normocephalic and atraumatic without obvious abnormalities. No apparent alopecia or balding. Ears:  External ear exam shows no significant lesions or deformities.  Otoscopic examination reveals clear canals, tympanic membranes are intact bilaterally without bulging, retraction, inflammation or discharge. Hearing is grossly normal bilaterally. Nose:  External nasal examination shows no deformity or inflammation. Nasal mucosa are pink and moist without lesions or exudates. Mouth:  Oral mucosa and oropharynx without lesions or exudates.  Teeth in good repair. Neck:  No deformities, masses, or tenderness noted. Lungs:  Normal respiratory effort, chest expands symmetrically. Lungs are clear to auscultation, no crackles or wheezes. Heart:  Normal rate and regular rhythm. S1 and S2 normal without gallop, murmur, click, rub or other extra sounds. Pulses:  R posterior tibial normal, R dorsalis pedis normal, L posterior tibial normal, and L dorsalis pedis normal.   Extremities:  No clubbing, cyanosis, edema, or deformity noted with normal full range of motion of all joints.   Cervical Nodes:  No lymphadenopathy noted Psych:  normally interactive, good eye contact, not anxious appearing, and not agitated.    Diabetes Management Exam:    Foot Exam (with socks and/or shoes not present):       Sensory-Monofilament:          Left foot:  normal          Right foot: normal       Inspection:          Left foot: abnormal             Comments: plantar wart, skin very dry & peeling plantar aspect          Right foot: abnormal             Comments: Plantar wart, skin very dry & peeling plantar aspect       Nails:          Left foot: thickened & very long          Right foot: thickened & very long   Impression & Recommendations:  Problem # 1:  BRONCHITIS, ACUTE (ICD-466.0) Assessment New  His updated medication list for this problem includes:    Bactrim Ds 800-160 Mg Tabs (Sulfamethoxazole-trimethoprim) .Marland Kitchen... Take 1 two times a day x 10 days    Mucinex Dm 30-600 Mg Xr12h-tab (Dextromethorphan-guaifenesin) .Marland Kitchen... Take 1 tab q 12 hrs as needed cough & chest congestion  Problem # 2:  DIABETES MELLITUS, TYPE II (ICD-250.00)  Pt is overdue for labs, podiatry appt, & opthamolgy exam. Encouraged pt to sched his podiatry & opth appts. Labs ordered fasting. To sched f/u appt in 1 mos.  His updated medication list for this problem includes:    Metformin Hcl 1000 Mg Tabs (Metformin hcl) .Marland Kitchen... Take 1 tablet by mouth two times a day    Aspirin 325 Mg Tabs (Aspirin) .Marland Kitchen... Take 1 tablet by mouth once a day    Glipizide 10 Mg Xr24h-tab (Glipizide) .Marland Kitchen... Take 1 tablet by mouth two times a day    Diovan 320 Mg Tabs (Valsartan) .Marland Kitchen... Take 1 tablet by mouth once a day  Orders: T-Comprehensive Metabolic Panel [id number]) T- Hemoglobin A1C ([id number]) Home Health Referral [facility])  Problem # 3:  HYPERTENSION (ICD-401.9) Assessment: Unchanged  His updated medication list for this problem includes:    Diovan 320 Mg Tabs (Valsartan) .Marland Kitchen... Take 1 tablet by mouth once a day    Amlodipine Besylate 10 Mg Tabs (Amlodipine besylate) .Marland Kitchen... Take 1 tablet by mouth at bedtime    Maxzide-25 37.5-25 Mg Tabs (Triamterene-hctz) .Marland Kitchen... Take 1 tablet by mouth once a day  BP today: 138/82 Prior BP: 134/84 (08/27/2009)  Labs Reviewed: K+:  4.1 (08/27/2009) Creat: : 1.09 (08/27/2009)   Chol: 140 (08/27/2009)   HDL: 34 (08/27/2009)   LDL: 45 (08/27/2009)   TG: 307 (08/27/2009)  Orders: T-Comprehensive Metabolic Panel ([id number])  Problem # 4:  SCHIZOPHRENIA (ICD-295.90) Assessment: Unchanged  Will order home health evaluation to see if we can  get pt some assistance in the home.  Orders: T-CBC No Diff ([id number]) Home Health Referral (Home Health)  Complete Medication List: 1)  Metformin Hcl 1000 Mg Tabs (Metformin hcl) .... Take 1 tablet by mouth two times a day 2)  Aspirin 325 Mg Tabs (Aspirin) .... Take 1 tablet by mouth once a day 3)  Oscal 500/200 D-3 500-200 Mg-unit Tabs (Calcium-vitamin d) .... Take 1 tablet by mouth two times a day 4)  Glipizide 10 Mg Xr24h-tab (Glipizide) .... Take 1 tablet by mouth two times a day 5)  Hydroxyzine Hcl 25 Mg Tabs (Hydroxyzine hcl) .... Take one tab by mouth at bedtime 6)  Risperdal 4 Mg Tabs (Risperidone) .... Take 1 tab by mouth at bedtime 7)  Diovan 320 Mg Tabs (Valsartan) .... Take 1 tablet by mouth once a day 8)  Amlodipine Besylate 10 Mg Tabs (Amlodipine besylate) .... Take 1 tablet by mouth at bedtime 9)  Maxzide-25 37.5-25 Mg Tabs (Triamterene-hctz) .... Take 1 tablet by mouth once a day 10)  Fioricet 50-325-40 Mg Tabs (Butalbital-apap-caffeine) .... Take 1 tablet by mouth two times a day as needed, max twice weekly 11)  Simvastatin 40 Mg Tabs (Simvastatin) .... One tab by mouth qhs 12)  Bactrim Ds 800-160 Mg Tabs (Sulfamethoxazole-trimethoprim) .... Take 1 two times a day x 10 days 13)  Mucinex Dm 30-600 Mg Xr12h-tab (Dextromethorphan-guaifenesin) .... Take 1 tab q 12 hrs as needed cough & chest congestion  Other Orders: T-Lipid Profile ([id number]) T-TSH [id number]) T-PSA [id number]) T-Vitamin D (25-Hydroxy) ([id number])  Patient Instructions: 1)  Please schedule a follow-up appointment in 1 month. 2)  Tobacco is very bad for your health and your loved  ones! You Should stop smoking!. 3)  Stop Smoking Tips: Choose a Quit date. Cut down before the Quit date. decide what you will do as a substitute when you feel the urge to smoke(gum,toothpick,exercise). 4)  It is important that you exercise regularly at least 20 minutes 5 times a week. If you develop chest pain, have severe difficulty breathing, or feel very tired , stop exercising immediately and seek medical attention. 5)  You need to lose weight. Consider a lower calorie diet and regular exercise.  6)  Get plenty of rest, drink lots of clear liquids, and use Tylenol or Ibuprofen for fever and comfort. Return in 7-10 days if you're not better:sooner if you're feeling worse. 7)  I have prescribed an antibiotic and cough medicine for you. 8)  Have blood work done fasting before your next appt. Prescriptions: MUCINEX DM 30-600 MG XR12H-TAB (DEXTROMETHORPHAN-GUAIFENESIN) take 1 tab q 12 hrs as needed cough & chest congestion  #20 x 0   Entered and Authorized by:   Esperanza Sheets PA   Signed by:   Esperanza Sheets PA on 03/01/2010   Method used:   Electronically to        [facility]* (retail)       [address]       [place], [place]  [address]       Ph: [phone]       Fax: [phone]   RxID:   [id number] BACTRIM DS 800-160 MG TABS (SULFAMETHOXAZOLE-TRIMETHOPRIM) take 1 two times a day x 10 days  #20 x 0   Entered and Authorized by:   Esperanza Sheets PA   Signed by:   Esperanza Sheets PA on 03/01/2010   Method used:   Electronically to  [facility]* (retail)       [address]       [place], [place]  [address]       Ph: [phone]       Fax: [phone]   RxID:   [id number]

## 2011-01-17 NOTE — Progress Notes (Signed)
Summary: EYE EXAM  EYE EXAM   Imported By: Lind Guest 03/18/2009 15:46:02  _____________________________________________________________________  External Attachment:    Type:   Image     Comment:   External Document

## 2011-01-17 NOTE — Progress Notes (Signed)
Summary: MEDICINE  Phone Note Call from Patient   Summary of Call: WANTS TO KNOW CAN HE TAKE ALKA SELTZER PLUS COLD MEDICINE CALL BACK AT [phone] Initial call taken by: Lind Guest,  October 25, 2009 10:16 AM  Follow-up for Phone Call        He has htn and diabetes =can he take alker seltzer cold? Follow-up by: Everitt Amber,  October 25, 2009 12:58 PM  Additional Follow-up for Phone Call Additional follow up Details #1::        better to use sugar free robitussin and tylenol for pain or fever Additional Follow-up by: Syliva Overman MD,  October 25, 2009 1:10 PM    Additional Follow-up for Phone Call Additional follow up Details #2::    returned call, left msg Follow-up by: Everitt Amber,  October 25, 2009 1:50 PM  Additional Follow-up for Phone Call Additional follow up Details #3:: Details for Additional Follow-up Action Taken: returned call, left msg Additional Follow-up by: Everitt Amber,  October 26, 2009 9:38 AM  returned call, left message Worthy Keeler LPN  October 27, 2009 9:36 AM

## 2011-01-17 NOTE — Miscellaneous (Signed)
Summary: Med Change  Clinical Lists Changes  Medications: Added new medication of FENOFIBRATE 160 MG TABS (FENOFIBRATE) Take 1 tab by mouth at bedtime - Signed Rx of FENOFIBRATE 160 MG TABS (FENOFIBRATE) Take 1 tab by mouth at bedtime;  #30 x 4;  Signed;  Entered by: Everitt Amber;  Authorized by: Syliva Overman MD;  Method used: Electronically to [facility]*, [address] [place], [place], [place]  [address], Ph: [phone], Fax: [phone]    Prescriptions: FENOFIBRATE 160 MG TABS (FENOFIBRATE) Take 1 tab by mouth at bedtime  #30 x 4   Entered by:   Everitt Amber   Authorized by:   Syliva Overman MD   Signed by:   Everitt Amber on 01/26/2009   Method used:   Electronically to        [facility]* (retail)       [address]       [place], [place]  [address]       Ph: [phone]       Fax: [phone]   RxID:   [id number]

## 2011-01-17 NOTE — Progress Notes (Signed)
  Phone Note Call from Patient   Caller: Patient Summary of Call: medicaid now effective, wants nurse aid Initial call taken by: Adella Hare LPN,  October 20, 2010 3:36 PM  Follow-up for Phone Call        pls complete an fL:T for him from the office notes I will sign on this when done thanks.  Follow-up by: Syliva Overman MD,  October 20, 2010 5:22 PM  Additional Follow-up for Phone Call Additional follow up Details #1::        medicaid # [id number] N Additional Follow-up by: Adella Hare LPN,  October 25, 2010 11:18 AM    Additional Follow-up for Phone Call Additional follow up Details #2::    patient has medicaid and medicare ccme is for medicaid patients do i fill out a ccme form or fl2 since he also has medicare? Follow-up by: Adella Hare LPN,  October 28, 2010 4:10 PM  Additional Follow-up for Phone Call Additional follow up Details #3:: Details for Additional Follow-up Action Taken: he is requesting cap assistance which we would use the medicaid for, , since I doon't reclall ther exact process, I will send lydia aflag and ask her to direct you , with a flag ho ww to go from here. The main thing I do know is that without medicaid pt is not eligible for CAP Additional Follow-up by: Syliva Overman MD,  October 28, 2010 4:15 PM  ccme form completed Adella Hare LPN  November 02, 2010 9:19 AM

## 2011-01-17 NOTE — Letter (Signed)
Summary: 2nd Missed Appt.  [facility]  [address]   [place], [place] [address]   Phone: [phone]  Fax: [phone]    March 01, 2010  MRN: [id number]  Matthew Molina [address] [place], [place]  [address]  Dear Mr. SENNA,  At [facility], we are committed to providing quality health care at the most reasonable cost to you.  It is our policy to require 24 hours' advance notice for appointment cancellations.  If you cancel your appointment with less than 24 hours notice or do not show up for your appointment, the time that has been reserved for you often goes unfilled, depriving other patients who need to be seen and increasing our cost of providing health care due to lost revenues.  We have reviewed your appointment history and found that this is at least the second time you have not showed up for an appointment or canceled at the last minute.  We understand that emergencies occur, and we usually will work with you if that is the case.  However, missed appointments are very disruptive to our office and can have a negative effect on the care we are able to provide our patients.    Thank you for your consideration.   Altamease Oiler      FINAL NOTICE

## 2011-01-17 NOTE — Assessment & Plan Note (Signed)
Summary: FOLLOW UP FROM ED   Vital Signs:  Patient profile:   60 year old male Height:      73 inches (185.42 cm) Weight:      264.44 pounds (120.20 kg) BMI:     35.01 BSA:     2.43 Pulse rate:   89 / minute Pulse rhythm:   regular Resp:     16 per minute BP sitting:   140 / 90  (left arm)  Vitals Entered By: Worthy Keeler LPN (March 08, 2009 1:09 PM)  Nutrition Counseling: Patient's BMI is greater than 25 and therefore counseled on weight management options. Is Patient Diabetic? Yes  Pain Assessment Patient in pain? no        History of Present Illness: one and a half weeks ago pt had a severe headache on a Saturday which he went to the Ed for. He states he was told that a scan done was normal and he was given medication under his tongue which relieved his symptoms. He has suffered fom migraines in the past.he denies any significant associated weakness or numbness, he reports total symptom relief. He had no associated neck stiffness, fever chills, or symptoms suggestive of viral illness. he denies polyuria, polydypsia or blurred vision.  Preventive Screening-Counseling & Management     Smoking Cessation Counseling: yes  Allergies: No Known Drug Allergies  Review of Systems General:  Denies chills and fever. ENT:  Denies earache, hoarseness, nasal congestion, postnasal drainage, sinus pressure, and sore throat. CV:  Denies chest pain or discomfort, palpitations, and swelling of feet. Resp:  Denies cough, sputum productive, and wheezing. GI:  Denies abdominal pain, constipation, diarrhea, nausea, and vomiting. GU:  Denies dysuria and urinary frequency. MS:  Denies joint pain and stiffness. Neuro:  See HPI. Endo:  Denies cold intolerance, excessive hunger, excessive thirst, excessive urination, heat intolerance, polyuria, and weight change.  Physical Exam  General:  alert, well-hydrated, and overweight-appearing. HEENT: No facial asymmetry,  EOMI, No sinus tenderness,  TM's Clear, oropharynx  pink and moist.   Chest: Clear to auscultation bilaterally.  CVS: S1, S2, No murmurs, No S3.   Abd: Soft, Nontender.  MS: Adequate ROM spine, hips, shoulders and knees.  Ext: No edema.   CNS: CN 2-12 intact, power tone and sensation normal throughout.   Skin: Intact, no visible lesions or rashes.  Psych: Good eye contact, normal affect.  Memory intact, not anxious or depressed appearing.      Impression & Recommendations:  Problem # 1:  HEADACHE (ICD-784.0) Assessment Improved  His updated medication list for this problem includes:    Aspirin 325 Mg Tabs (Aspirin) .Marland Kitchen... Take 1 tablet by mouth once a day    Fioricet 50-325-40 Mg Tabs (Butalbital-apap-caffeine) .Marland Kitchen... Take 1 tablet by mouth two times a day as needed, max twice weekly  Problem # 2:  NICOTINE ADDICTION (ICD-305.1) Assessment: Unchanged  Encouraged smoking cessation and discussed different methods for smoking cessation.   Problem # 3:  HYPERTENSION (ICD-401.9) Assessment: Improved  His updated medication list for this problem includes:    Diovan 320 Mg Tabs (Valsartan) .Marland Kitchen... Take 1 tablet by mouth once a day    Caduet 10-40 Mg Tabs (Amlodipine-atorvastatin) .Marland Kitchen... Take 1 tab by mouth at bedtime    Maxzide-25 37.5-25 Mg Tabs (Triamterene-hctz) .Marland Kitchen... Take 1 tablet by mouth once a day  BP today: 140/90 Prior BP: 160/90 (02/25/2009)  Labs Reviewed: Creat: 1.17 (06/25/2008) Chol: 154 (06/25/2008)   HDL: 26 (06/25/2008)   LDL:  69 (06/25/2008)   TG: 295 (06/25/2008)  Problem # 4:  OBESITY (ICD-278.00) Assessment: Unchanged  Complete Medication List: 1)  Metformin Hcl 1000 Mg Tabs (Metformin hcl) .... Take 1 tablet by mouth two times a day 2)  Aspirin 325 Mg Tabs (Aspirin) .... Take 1 tablet by mouth once a day 3)  Oscal 500/200 D-3 500-200 Mg-unit Tabs (Calcium-vitamin d) .... Take 1 tablet by mouth two times a day 4)  Glipizide 10 Mg Xr24h-tab (Glipizide) .... Take 1 tablet by mouth two  times a day 5)  Hydroxyzine Hcl 25 Mg Tabs (Hydroxyzine hcl) .... Take one tab by mouth at bedtime 6)  Risperdal 4 Mg Tabs (Risperidone) .... Take 1 tab by mouth at bedtime 7)  Diovan 320 Mg Tabs (Valsartan) .... Take 1 tablet by mouth once a day 8)  Fenofibrate 160 Mg Tabs (Fenofibrate) .... Take 1 tab by mouth at bedtime 9)  Caduet 10-40 Mg Tabs (Amlodipine-atorvastatin) .... Take 1 tab by mouth at bedtime 10)  Maxzide-25 37.5-25 Mg Tabs (Triamterene-hctz) .... Take 1 tablet by mouth once a day 11)  Fioricet 50-325-40 Mg Tabs (Butalbital-apap-caffeine) .... Take 1 tablet by mouth two times a day as needed, max twice weekly  Other Orders: Glucose, (CBG) ([address])  Patient Instructions: 1)  F/U as before. 2)  Tobacco is very bad for your health and your loved ones! You Should stop smoking!. 3)  Stop Smoking Tips: Choose a Quit date. Cut down before the Quit date. decide what you will do as a substitute when you feel the urge to smoke(gum,toothpick,exercise). 4)  It is important that you exercise regularly at least 20 minutes 5 times a week. If you develop chest pain, have severe difficulty breathing, or feel very tired , stop exercising immediately and seek medical attention. 5)  You need to lose weight. Consider a lower calorie diet and regular exercise.  6)  You will start a new BP med which has been sent in,pls continue the old meds. 7)  Medication has also been sent in for your headaches for use only if needed. Prescriptions: FIORICET 50-325-40 MG TABS (BUTALBITAL-APAP-CAFFEINE) Take 1 tablet by mouth two times a day as needed, max twice weekly  #30 x 1   Entered and Authorized by:   Syliva Overman MD   Signed by:   Syliva Overman MD on 03/08/2009   Method used:   Electronically to        [facility] (retail)       [address]       [place], [place]  [address]       Ph: [phone]       Fax: [phone]   RxID:    [id number] MAXZIDE-25 37.5-25 MG TABS (TRIAMTERENE-HCTZ) Take 1 tablet by mouth once a day  #30 x 4   Entered and Authorized by:   Syliva Overman MD   Signed by:   Syliva Overman MD on 03/08/2009   Method used:   Electronically to        [facility] (retail)       [address]       [place], [place]  [address]       Ph: [phone]       Fax: [phone]   RxID:   [id number]       Laboratory Results   Blood Tests   Date/Time Received: 03/08/09 1:10pm  Date/Time Reported: 03/08/09 1:10pm  Glucose (random): 157 mg/dL   (Normal Range: 16-109)

## 2011-01-17 NOTE — Progress Notes (Signed)
  Phone Note From Pharmacy   Caller: [facility] Summary of Call: patient advised cost of med is an issue- can you substitue lipitor with a cheaper med? Initial call taken by: Worthy Keeler LPN,  October 21, 2009 11:03 AM  Follow-up for Phone Call        advise pt and change to simvastatin 40mg  1 at bedtime #30 refill 4  Follow-up by: Syliva Overman MD,  October 21, 2009 11:54 AM  Additional Follow-up for Phone Call Additional follow up Details #1::        Called Patient, rx sent Additional Follow-up by: Worthy Keeler LPN,  October 21, 2009 4:20 PM    New/Updated Medications: SIMVASTATIN 40 MG TABS (SIMVASTATIN) one tab by mouth qhs Prescriptions: SIMVASTATIN 40 MG TABS (SIMVASTATIN) one tab by mouth qhs  #30 x 4   Entered by:   Worthy Keeler LPN   Authorized by:   Syliva Overman MD   Signed by:   Worthy Keeler LPN on 41/32/4401   Method used:   Electronically to        [facility] (retail)       [address] [place], [place]  [address]       Ph: [phone]       Fax: [phone]   RxID:   [id number]

## 2011-01-17 NOTE — Miscellaneous (Signed)
Summary: refill  Clinical Lists Changes  Medications: Rx of FIORICET 50-325-40 MG TABS (BUTALBITAL-APAP-CAFFEINE) Take 1 tablet by mouth two times a day as needed, max twice weekly;  #30 x 3;  Signed;  Entered by: Worthy Keeler LPN;  Authorized by: Syliva Overman MD;  Method used: Electronically to [facility] [address] [place], [place], [place]  [address], Ph: [phone], Fax: [phone] Rx of AMLODIPINE BESYLATE 10 MG TABS (AMLODIPINE BESYLATE) Take 1 tablet by mouth at bedtime;  #30 x 3;  Signed;  Entered by: Worthy Keeler LPN;  Authorized by: Syliva Overman MD;  Method used: Electronically to [facility]*, [address] [place], [place], [place]  [address], Ph: [phone], Fax: [phone]    Prescriptions: AMLODIPINE BESYLATE 10 MG TABS (AMLODIPINE BESYLATE) Take 1 tablet by mouth at bedtime  #30 x 3   Entered by:   Worthy Keeler LPN   Authorized by:   Syliva Overman MD   Signed by:   Worthy Keeler LPN on 66/44/0347   Method used:   Electronically to        [facility] (retail)       [address]       [place], [place]  [address]       Ph: [phone]       Fax: [phone]   RxID:   [id number] FIORICET 50-325-40 MG TABS (BUTALBITAL-APAP-CAFFEINE) Take 1 tablet by mouth two times a day as needed, max twice weekly  #30 x 3   Entered by:   Worthy Keeler LPN   Authorized by:   Syliva Overman MD   Signed by:   Worthy Keeler LPN on 22/01/5426   Method used:   Electronically to        [facility] (retail)       [address] [place], [place]  [address]       Ph: [phone]       Fax: [phone]   RxID:   [id number]

## 2011-01-17 NOTE — Miscellaneous (Signed)
Summary: refill  Clinical Lists Changes  Medications: Rx of ANTARA 130 MG  CAPS (FENOFIBRATE MICRONIZED) Take 1 tab by mouth at bedtime;  #30 x 1;  Signed;  Entered by: Worthy Keeler LPN;  Authorized by: Syliva Overman MD;  Method used: Electronically to [facility]*, [address], [place], [place], [place]  [address], Ph: [phone], Fax: [phone] Rx of CADUET 5-20 MG  TABS (AMLODIPINE-ATORVASTATIN) Take 1 tablet by mouth once a day;  #30 x 1;  Signed;  Entered by: Worthy Keeler LPN;  Authorized by: Syliva Overman MD;  Method used: Electronically to [facility]*, [address] [place], [place], [place]  [address], Ph: [phone], Fax: [phone] Rx of DIOVAN 160 MG  TABS (VALSARTAN) Take 1 tablet by mouth once a day;  #30 x 1;  Signed;  Entered by: Worthy Keeler LPN;  Authorized by: Syliva Overman MD;  Method used: Electronically to [facility]*, [address], [place], [place], [place]  [address], Ph: [phone], Fax: [phone] Rx of METFORMIN HCL 1000 MG  TABS (METFORMIN HCL) Take 1 tablet by mouth two times a day;  #30 x 1;  Signed;  Entered by: Worthy Keeler LPN;  Authorized by: Syliva Overman MD;  Method used: Electronically to [facility]*, [address], [place], [place], [place]  [address], Ph: [phone], Fax: [phone]    Prescriptions: METFORMIN HCL 1000 MG  TABS (METFORMIN HCL) Take 1 tablet by mouth two times a day  #30 x 1   Entered by:   Worthy Keeler LPN   Authorized by:   Syliva Overman MD   Signed by:   Worthy Keeler LPN on 76/16/0737   Method used:   Electronically to        [facility] (retail)       [address]       [place], [place]  [address]       Ph: [phone]       Fax: [phone]   RxID:   [id number] DIOVAN 160 MG  TABS (VALSARTAN) Take 1 tablet by mouth once a day  #30 x 1   Entered by:    Worthy Keeler LPN   Authorized by:   Syliva Overman MD   Signed by:   Worthy Keeler LPN on 51/01/5851   Method used:   Electronically to        [facility] (retail)       [address]       [place], [place]  [address]       Ph: [phone]       Fax: [phone]   RxID:   [id number] CADUET 5-20 MG  TABS (AMLODIPINE-ATORVASTATIN) Take 1 tablet by mouth once a day  #30 x 1   Entered by:   Worthy Keeler LPN   Authorized by:   Syliva Overman MD   Signed by:   Worthy Keeler LPN on 33/82/5053   Method used:   Electronically to        [facility] (retail)       [address]       [place], [place]  [address]       Ph: [phone]       Fax: [phone]   RxID:   [id number] ANTARA 130 MG  CAPS (FENOFIBRATE MICRONIZED) Take 1  tab by mouth at bedtime  #30 x 1   Entered by:   Worthy Keeler LPN   Authorized by:   Syliva Overman MD   Signed by:   Worthy Keeler LPN on 27/25/3664   Method used:   Electronically to        [facility] (retail)       [address]       [place], [place]  [address]       Ph: [phone]       Fax: [phone]   RxID:   [id number]

## 2011-01-17 NOTE — Medication Information (Signed)
Summary: Tax adviser   Imported By: Lind Guest 03/25/2009 13:42:00  _____________________________________________________________________  External Attachment:    Type:   Image     Comment:   External Document

## 2011-01-17 NOTE — Progress Notes (Signed)
Summary: EYE EXAM  EYE EXAM   Imported By: Lind Guest 05/25/2010 13:42:42  _____________________________________________________________________  External Attachment:    Type:   Image     Comment:   External Document

## 2011-01-17 NOTE — Assessment & Plan Note (Signed)
Summary: NURSE VISIT  Nurse Visit   Vitals Entered By: Everitt Amber (March 24, 2009 2:30 PM)  CC: Patient came in for glucose check after his meter read 589 at home. His sugar was within normal limits at 136 Comments Advised to take meter to the pharmacy to have them check it for accuracy and show him how to use it properly    Allergies: No Known Drug Allergies  Laboratory Results   Blood Tests   Date/Time Received: March 24, 2009 2pm Date/Time Reported: March 24, 2009  2pm  Glucose (random): 136 mg/dL   (Normal Range: 81-191)       Orders Added: 1)  Glucose, (CBG) [place]    ]

## 2011-01-19 NOTE — Letter (Signed)
Summary: 2ND NO SHOW LETTER  2ND NO SHOW LETTER   Imported By: Lind Guest 01/10/2011 10:07:23  _____________________________________________________________________  External Attachment:    Type:   Image     Comment:   External Document

## 2011-01-19 NOTE — Progress Notes (Signed)
Summary: MEDICINE  Phone Note Call from Patient   Summary of Call: HIS INSURANCE CALLED HIM AND TOLD HIM THAT THE MEDICINE HYDROXYZINE 25 MG  WAS NOT COVERED COULD YOU DO ANOTHER MEDICINE CALL BACK AT [phone] [place]. Initial call taken by: Lind Guest,  December 29, 2010 3:34 PM  Follow-up for Phone Call        advise only substitue ios benadryl, which is otc and also may not be covered I will try to send in, printed pls fax to pharmacy, write d/x hydroxyzine on script before faxing pls Follow-up by: Syliva Overman MD,  December 30, 2010 12:32 PM  Additional Follow-up for Phone Call Additional follow up Details #1::        Faxed in and patient aware Additional Follow-up by: Everitt Amber LPN,  December 30, 2010 1:43 PM    New/Updated Medications: COMPLETE ALLERGY RELIEF 25 MG TABS (DIPHENHYDRAMINE HCL) Take 1 tab by mouth at bedtime Prescriptions: COMPLETE ALLERGY RELIEF 25 MG TABS (DIPHENHYDRAMINE HCL) Take 1 tab by mouth at bedtime  #30 x 3   Entered and Authorized by:   Syliva Overman MD   Signed by:   Syliva Overman MD on 12/30/2010   Method used:   Printed then faxed to ...       [facility] (retail)       [address]       [place], [place]  [address]       Ph: [phone]       Fax: [phone]   RxID:   [id number]

## 2011-02-27 ENCOUNTER — Telehealth (INDEPENDENT_AMBULATORY_CARE_PROVIDER_SITE_OTHER): Payer: Self-pay | Admitting: *Deleted

## 2011-03-07 ENCOUNTER — Encounter: Payer: Self-pay | Admitting: Family Medicine

## 2011-03-07 NOTE — Progress Notes (Signed)
Summary: number disconnected  Phone Note Call from Patient   Summary of Call: Tired to call pt back for jamie. He needed to make appt in a month, but his number was disconnected.  Initial call taken by: Rudene Anda,  February 27, 2011 2:27 PM

## 2011-03-16 NOTE — Letter (Signed)
Summary: ymca  ymca   Imported By: Lind Guest 03/07/2011 11:33:47  _____________________________________________________________________  External Attachment:    Type:   Image     Comment:   External Document

## 2011-03-23 ENCOUNTER — Other Ambulatory Visit: Payer: Self-pay | Admitting: Family Medicine

## 2011-03-27 ENCOUNTER — Telehealth: Payer: Self-pay | Admitting: Family Medicine

## 2011-03-28 ENCOUNTER — Telehealth: Payer: Self-pay | Admitting: Family Medicine

## 2011-03-28 NOTE — Telephone Encounter (Signed)
Let him know will be adresed at his ov , way past due , he keeps missing them

## 2011-03-30 ENCOUNTER — Other Ambulatory Visit: Payer: Self-pay | Admitting: Family Medicine

## 2011-04-14 ENCOUNTER — Encounter: Payer: Self-pay | Admitting: Family Medicine

## 2011-04-17 ENCOUNTER — Ambulatory Visit (INDEPENDENT_AMBULATORY_CARE_PROVIDER_SITE_OTHER): Payer: Medicare PPO | Admitting: Family Medicine

## 2011-04-17 ENCOUNTER — Telehealth: Payer: Self-pay | Admitting: Family Medicine

## 2011-04-17 ENCOUNTER — Encounter: Payer: Self-pay | Admitting: Family Medicine

## 2011-04-17 VITALS — BP 130/78 | HR 88 | Resp 16 | Wt 253.1 lb

## 2011-04-17 DIAGNOSIS — E119 Type 2 diabetes mellitus without complications: Secondary | ICD-10-CM

## 2011-04-17 DIAGNOSIS — I10 Essential (primary) hypertension: Secondary | ICD-10-CM

## 2011-04-17 DIAGNOSIS — Z720 Tobacco use: Secondary | ICD-10-CM

## 2011-04-17 DIAGNOSIS — R5383 Other fatigue: Secondary | ICD-10-CM

## 2011-04-17 DIAGNOSIS — Z23 Encounter for immunization: Secondary | ICD-10-CM

## 2011-04-17 DIAGNOSIS — R5381 Other malaise: Secondary | ICD-10-CM

## 2011-04-17 DIAGNOSIS — E785 Hyperlipidemia, unspecified: Secondary | ICD-10-CM

## 2011-04-17 DIAGNOSIS — E669 Obesity, unspecified: Secondary | ICD-10-CM

## 2011-04-17 DIAGNOSIS — F17200 Nicotine dependence, unspecified, uncomplicated: Secondary | ICD-10-CM

## 2011-04-17 DIAGNOSIS — E1169 Type 2 diabetes mellitus with other specified complication: Secondary | ICD-10-CM | POA: Insufficient documentation

## 2011-04-17 MED ORDER — GLIPIZIDE ER 10 MG PO TB24: 10.0000 mg | ORAL_TABLET | Freq: Every day | ORAL | Status: DC

## 2011-04-17 MED ORDER — AMLODIPINE BESYLATE 10 MG PO TABS: 10.0000 mg | ORAL_TABLET | Freq: Every day | ORAL | Status: DC

## 2011-04-17 MED ORDER — RISPERIDONE 4 MG PO TABS: 4.0000 mg | ORAL_TABLET | Freq: Every day | ORAL | Status: DC

## 2011-04-17 MED ORDER — PRAVASTATIN SODIUM 40 MG PO TABS: 40.0000 mg | ORAL_TABLET | Freq: Every day | ORAL | Status: DC

## 2011-04-17 MED ORDER — TRIAMTERENE-HCTZ 37.5-25 MG PO TABS: 1.0000 | ORAL_TABLET | Freq: Every day | ORAL | Status: DC

## 2011-04-17 MED ORDER — VALSARTAN 320 MG PO TABS: 320.0000 mg | ORAL_TABLET | Freq: Every day | ORAL | Status: DC

## 2011-04-17 NOTE — Progress Notes (Signed)
  Subjective:    Patient ID: Matthew Molina, male    DOB: [birth year], 60 y.o.   MRN: [id number]  HPI HYPERTENSION Disease Monitoring Blood pressure range-unknown Chest pain- no      Dyspnea- no Medications Compliance- varies Lightheadedness- no   Edema- no   DIABETES Disease Monitoring Blood Sugar ranges-fasting range is 110 to 120 Polyuria- no New Visual problems- no Medications Compliance- varies Hypoglycemic symptoms- no Podiatry: not regularly, pt will schedule Eye exam  Past due,   HYPERLIPIDEMIA Disease Monitoring See symptoms for Hypertension Medications Compliance- poor RUQ pain- no  Muscle aches- no Colonoscopy past due, pt states he is afraid, but is willing to schedule     Review of Systems Denies recent fever or chills. Denies sinus pressure, nasal congestion, ear pain or sore throat. Denies chest congestion, productive cough or wheezing. Denies chest pains, palpitations, paroxysmal nocturnal dyspnea, orthopnea and leg swelling Denies abdominal pain, nausea, vomiting,diarrhea or constipation.  Denies rectal bleeding or change in bowel movement. Denies dysuria, frequency, hesitancy or incontinence. Denies joint pain, swelling and limitation in mobility. Denies headaches, seizure, numbness, or tingling. Denies uncontrolled depression, anxiety or insomnia.Denies hallucinations , suicidal or homicidal. Denies skin break down or rash.        Objective:   Physical Exam Patient alert and oriented and in no Cardiopulmonary distress.  HEENT: No facial asymmetry, EOMI, no sinus tenderness, TM's clear, Oropharynx pink and moist.  Neck supple no adenopathy.  Chest: Clear to auscultation bilaterally.  CVS: S1, S2 no murmurs, no S3.  ABD: Soft non tender. Bowel sounds normal.  Ext: No edema  MS: Adequate ROM spine, shoulders, hips and knees.  Skin: Intact, no ulcerations or rash noted.  Psych: Good eye contact,flat affect. Memory loss, Diabetic Foot Check:   Appearance - no  ulcers but  Calluses are present Skin - no unusual pallor or redness Sensation - grossly intact to light touch Monofilament testing -  Right - Great toe, medial, central, lateral ball and posterior foot diminished Left - Great toe, medial, central, lateral ball and posterior foot diminished Pulses Left - Dorsalis Pedis and Posterior Tibia normal Right - Dorsalis Pedis and Posterior Tibia normal not anxious or depressed appearing. Nails overgrown and horning, needs podiatric care  CNS: CN 2-12 intact, power, tone and sensation normal throughout.        Assessment & Plan:

## 2011-04-17 NOTE — Patient Instructions (Addendum)
F/u in 4 months.  hBA1C CMP and EGFR today. Also CXR   Fsting labs in 4 months, lipid , cmp and HBA1C  You need to try to stop smoking , CXR today if possible  You will be referred for an eye exam,   Please think about quitting smoking.  This is very important for your health.  Consider setting a quit date, then cutting back or switching brands to prepare to stop.  Also think of the money you will save every day by not smoking.  Quick Tips to Quit Smoking: Fix a date i.e. keep a date in mind from when you would not touch a tobacco product to smoke  Keep yourself busy and block your mind with work loads or reading books or watching movies in malls where smoking is not allowed  Vanish off the things which reminds you about smoking for example match box, or your favorite lighter, or the pipe you used for smoking, or your favorite jeans and shirt with which you used to enjoy smoking, or the club where you used to do smoking  Try to avoid certain people places and incidences where and with whom smoking is a common factor to add on  Praise yourself with some token gifts from the money you saved by stopping smoking  Anti Smoking teams are there to help you. Join their programs  Anti-smoking Gums are there in many medical shops. Try them to quit smoking   Side-effects of Smoking: Disease caused by smoking cigarettes are emphysema, bronchitis, heart failures  Premature death  Cancer is the major side effect of smoking  Heart attacks and strokes are the quick effects of smoking causing sudden death  Some smokers lives end up with limbs amputated  Breathing problem or fast breathing is another side effect of smoking  Due to more intakes of smokes, carbon mono-oxide goes into your brain and other muscles of the body which leads to swelling of the veins and blockage to the air passage to lungs  Carbon monoxide blocks blood vessels which leads to blockage in the flow of blood to different major body  organs like heart lungs and thus leads to attacks and deaths  During pregnancy smoking is very harmful and leads to premature birth of the infant, spontaneous abortions, low weight of the infant during birth  Fat depositions to narrow and blocked blood vessels causing heart attacks  In many cases cigarette smoking caused infertility in men  You need to  Make an  appt with podiatrist to cut your nails, also for a colonscopy

## 2011-04-17 NOTE — Assessment & Plan Note (Signed)
Controlled, no change in medication  

## 2011-04-18 ENCOUNTER — Ambulatory Visit (HOSPITAL_COMMUNITY)
Admission: RE | Admit: 2011-04-18 | Discharge: 2011-04-18 | Disposition: A | Discharge status: Home / Self Care | Payer: Medicare Other | Source: Ambulatory Visit | Attending: Family Medicine | Admitting: Family Medicine

## 2011-04-18 DIAGNOSIS — E119 Type 2 diabetes mellitus without complications: Secondary | ICD-10-CM | POA: Insufficient documentation

## 2011-04-18 DIAGNOSIS — F17200 Nicotine dependence, unspecified, uncomplicated: Secondary | ICD-10-CM | POA: Insufficient documentation

## 2011-04-18 DIAGNOSIS — I10 Essential (primary) hypertension: Secondary | ICD-10-CM | POA: Insufficient documentation

## 2011-04-18 LAB — TSH: TSH: 2.198 u[IU]/mL (ref 0.350–4.500)

## 2011-04-18 LAB — BASIC METABOLIC PANEL WITH GFR
BUN: 10 mg/dL (ref 6–23)
CO2: 24 mEq/L (ref 19–32)
Calcium: 10.6 mg/dL — ABNORMAL HIGH (ref 8.4–10.5)
Chloride: 100 mEq/L (ref 96–112)
Creat: 1.05 mg/dL (ref 0.40–1.50)
GFR, Est African American: >60 mL/min (ref >60)
GFR, Est Non African American: >60 mL/min (ref >60)
Glucose, Bld: 87 mg/dL (ref 70–99)
Potassium: 3.9 mEq/L (ref 3.5–5.3)
Sodium: 138 mEq/L (ref 135–145)

## 2011-04-18 LAB — HEMOGLOBIN A1C
Hgb A1c MFr Bld: 6 % — ABNORMAL HIGH (ref <5.7)
Mean Plasma Glucose: 126 mg/dL — ABNORMAL HIGH (ref <117)

## 2011-04-18 NOTE — Telephone Encounter (Signed)
Labs ordered as requested.

## 2011-04-19 LAB — MICROALBUMIN / CREATININE URINE RATIO
Creatinine, Urine: 43.1 mg/dL
Microalb Creat Ratio: 11.6 mg/g (ref 0.0–30.0)
Microalb, Ur: 0.5 mg/dL (ref 0.00–1.89)

## 2011-04-19 NOTE — Telephone Encounter (Signed)
error 

## 2011-05-05 NOTE — H&P (Signed)
NAME:  Matthew, Molina NO.:  [id number]   MEDICAL RECORD NO.:  [id number]                   PATIENT TYPE:  EMS   LOCATION:  ED                                   FACILITY:  [facility]   PHYSICIAN:  Sarita Bottom, M.D.                  DATE OF BIRTH:  1951-03-21   DATE OF ADMISSION:  07/06/2003  DATE OF DISCHARGE:                                HISTORY & PHYSICAL   Patient's primary doctor is Dr. Lodema Hong   CHIEF COMPLAINT:  I have heaviness in my chest.   HISTORY OF PRESENT ILLNESS:  Matthew Molina is a 60 year old man with a  history of hypertension and diabetes.  He was well until this morning when  he noticed heaviness in his chest.  It got worse after he lifted some  furniture.  He admits to having shortness of breath and palpitations.  He  decided to come to the emergency room on account of the above.   REVIEW OF SYSTEMS:  He denies any fever or chills.  Denies any weight loss.  Denies any nausea or vomiting.  No diarrhea. CNS denies any dizziness.   PAST MEDICAL HISTORY:  Significant for hypertension, diabetes mellitus, and  schizophrenia.  He denies any history of having a stress test before.   MEDICATIONS:  1. He is on aspirin.  2. Glucophage 1000 b.i.d..  3. Diovan 160/25 daily.  4. Lipitor 20 daily.  5. Risperdal 1 mg in the morning and 2 mg at night.  6. Norvasc 5 mg daily.   ALLERGIES:  He claims to be allergic to Zoloft which gives him abdominal  pain.   FAMILY HISTORY:  His mother suffered from diabetes mellitus.   SOCIAL HISTORY:  He is separated with 2 daughters. He smokes about a pack of  cigarettes per day.  He has been smoking for many years.  He denies any  alcohol or street drugs.   PHYSICAL EXAMINATION:  VITAL SIGNS:  On examination his BP is 123/76.  Heart  rate is 86.  Oxygen saturation is 98% on room air.  GENERAL:  He is a middle-aged man lying comfortably in bed.  He does not  seem to be in any form of distress.  HEAD, EYES, EARS, NOSE, AND THROAT:  He is not pale.  He is anicteric.  Oral  mucosa is moist.  Pupils are equal and reactive to light and accommodation.  CHEST:  Chest is clear to auscultation.  CARDIOVASCULAR:  Heart sounds 1 and 2 are normal.  Rhythm is regular.  No  murmurs were heard.  ABDOMEN:  Abdomen is obese.  Bowel sounds are present.  No masses or  organomegaly is felt.  CENTRAL NERVOUS SYSTEM:  He is alert and oriented x3.  No gross or focal  deficits.  EXTREMITIES:  He has no edema.   LABS AND DIAGNOSTICS:  EKG shows  a sinus rhythm at 93 beats/minute, left-  ward axis, normal intervals.  He has no acute ST or T wave changes.  WBC  8.5, neutrophils 47%, hemoglobin 13.2, MCV 86.3, platelets are 217.  His  total CK 557, MB fraction 2.1, troponin is 0.02.  Liver function test is  essentially normal.  Sodium 134, potassium 3.1, chloride 101, CO2 28, BUN 8,  creatinine 1.1,  glucose 146, calcium is 9.2.   ASSESSMENT AND PLAN:  1. Chest pain.  The patient will be admitted to telemetry to rule out     myocardial infarction.  He will be put on aspirin 325 daily.  Serial     cardiac enzymes will be done to rule out acute myocardial infarction.     The patient will need a stress test on this admission.  We, therefore,     call cardiology for further assessment.  2. Elevated CK, possibly rhabdomyomyolosis secondary to statins.  I will     hold the patient's Lipitor, at this time, and hydrate him cautiously with     normal saline, and repeat his CK tomorrow morning.  3. Hypokalemia. This will be replenished with potassium chloride, 14 mEq to     start, and his Bumex will be monitored.  4. Hypertension.  The patient will be resumed on his Norvasc and Diovan.     His blood pressure will be monitored on this admission on this regimen     and medications adjusted accordingly.  5. Diabetes mellitus. The patient will be continued on Glucophage 1000 mg     t.i.d.  He will also be placed on  Accu-Check with sliding scale insulin.  6. Schizophrenia.  The patient will be maintained on his Risperdal.  The     patient will be admitted to the hospitalist; further management will     depend on clinical course.                                               Sarita Bottom, M.D.    DW/MEDQ  D:  07/06/2003  T:  07/06/2003  Job:  [id number]

## 2011-05-05 NOTE — Consult Note (Signed)
NAME:  Matthew Molina, Matthew Molina NO.:  [id number]   MEDICAL RECORD NO.:  [id number]                   PATIENT TYPE:  INP   LOCATION:  A209                                 FACILITY:  [facility]   PHYSICIAN:  Vida Roller, M.D.                DATE OF BIRTH:  01/06/1951   DATE OF CONSULTATION:  07/06/2003  DATE OF DISCHARGE:                                   CONSULTATION   HISTORY OF PRESENT ILLNESS:  Mr. Skillman is a 60 year old African American  man with a history of hypertension and diabetes, hyperlipidemia, who  presents for evaluation to the emergency department after an episode of  chest discomfort.  He states that this discomfort came on essentially while  he was sitting and watching TV.  It was associated with some shortness of  breath.  This has been something he has been evaluated for in the past, but  it has become more substantial.  He is uncertain as to what brought it on  nor is he certain about what made it go away and reported to the emergency  department.  At that time, he received some sublingual nitroglycerin and  aspirin which appeared to resolve the pain.  He has a history of dyspnea on  exertion, lives in an apartment complex where he used to take the stairs,  now has to take the elevator.  His diabetes and hypertension are difficult  to control.   PAST MEDICAL HISTORY:  Significant for hypertension, diabetes mellitus,  history of schizophrenia.  Her medical problems are managed by Milus Mallick.  Lodema Hong, M.D. in [place], [place].  His psychiatric problems by  [facility].   MEDICATIONS ON PRESENTATION:  1. Aspirin 325 mg a day.  2. Glucophage 1000 mg twice a day.  3. Diovan HCT 160 with 25 once a day.  4. Lipitor 20 mg a day.  5. Risperdal 1 mg in the morning; 2 mg at night.  6. Norvasc 5 mg a day.   SOCIAL HISTORY:  He smokes cigarettes and has avidly for the last 30 years.  He does not drink and  denies the use of street drugs.  He lives in  [place] by himself, he is divorced.  He has two daughters.   FAMILY HISTORY:  Father died in his late 33's of unknown causes.  Mother  died in her early 35's of a stroke.  He has one sister who has coronary  artery disease which has been treated.  He is not sure how.   REVIEW OF SYSTEMS:  Essentially, noncontributory.   ALLERGIES:  ZOLOFT, gives him abdominal pain.   PHYSICAL EXAMINATION:  GENERAL:  An obese black male in no apparent  distress.  Alert and oriented x4 and denies any hallucinations or homicidal  or suicidal ideation.  HEART:  Heart rate 94 and sinus, respiratory rate 18, blood pressure  103/58.  HEENT:  Unremarkable.  NECK:  Supple.  There is no jugular venous distention or carotid bruits.  CHEST:  Clear to auscultation.  HEART:  Nondisplaced point of maximal impulse, but no lifts or thrills.  First and second heart sounds are normal.  There is no third or fourth heart  sound.  There is a soft murmur heard at the apex which is systolic.  There  is no diastolic murmur.  There is no radiation to the murmur.  ABDOMEN:  Soft, nontender, obese, no hepatosplenomegaly.  EXTREMITIES:  Lower extremities, 2+ pulses.  There are no bruits noted.  He  has no edema.  NEUROLOGIC:  Nonfocal.  I did not do a mental status exam.   LABORATORY DATA:  Electrocardiogram:  Sinus rhythm at a rate of 58 with  normal intervals and there is left axis deviation.  There is nonspecific ST-  T wave changes, but otherwise, unremarkable.  There are no old EKG's for  comparison.  Chest x-ray:  Negative for cardiopulmonary disease.   White blood cell count 8000, H&H 13 and 39, platelet count 217,000.  PT 13,  PTT 33.  Sodium 134, potassium 3.1, chloride 101, bicarbonate 28, BUN 8,  creatinine 1.1 with a blood sugar of 146, nonfasting.  He has a single set  of cardiac enzymes which show a CK of 557 but an MB fraction of only 2.1,  giving a relative index  of less than 1 and his troponin is 0.02.  BNP is  less than 30.   ASSESSMENT:  Essentially we have a gentleman with a history, although not  documented, of positive urine for cocaine.  It is not on the emergency chart  at current, though it was reported to me by Dr. Letitia Libra.   Essentially, I have a gentleman with chest pain which is atypical.  He has  no pain now.  He has no enzymes consistent with acute myocardial infarction.  His electrocardiogram is unremarkable.  I think it is probably reasonable to  pursue further assessment here.   He has dyspnea on exertion which appears to be chronic and atypical for  coronary disease.  However, it has become more frequent.   He has significant tobacco abuse and I suspect he probably has chronic  obstructive pulmonary disease by now.  He is to consider smoking cessation  for this.   There is a questionable history of cocaine abuse.  It appears that his  sample is positive.  He denies this and there is no documentation in the  records of it.   Finally, he has schizophrenia which appears to be well compensated.  He is  not actively schizophrenic from what I can tell and he is not hallucinating.   RECOMMENDATIONS:  Echocardiogram to assess LV function today, Cardiolite  tomorrow and once he has completed his rule out protocol, will need to cycle  the enzymes.  He needs a smoking cessation consultation, substance abuse  assessment.  I agree with the aspirin and Norvasc and Diovan.  However, I  would stop the HCTZ and supplement his potassium appropriately.                                                 Vida Roller, M.D.    JH/MEDQ  D:  07/06/2003  T:  07/06/2003  Job:  [id number]

## 2011-05-05 NOTE — Procedures (Signed)
   NAME:  AMARIS, GARRETTE NO.:  [id number]   MEDICAL RECORD NO.:  [id number]                   PATIENT TYPE:  INP   LOCATION:  A209                                 FACILITY:  [facility]   PHYSICIAN:  Vida Roller, M.D.                DATE OF BIRTH:  1951/06/17   DATE OF PROCEDURE:  07/07/2003  DATE OF DISCHARGE:                                    STRESS TEST   EXERCISE CARDIOLITE   INDICATION:  [name] is a 60 year old male with no known coronary artery  disease who presented with atypical chest discomfort.  He was admitted to  [facility] and has had serial cardiac enzymes which were not  consistent with acute myocardial infarction.  His cardiac risk factors  include his diabetes, hypertension, tobacco abuse, and family history.   BASELINE DATA:  EKG shows sinus rhythm at 78 beats per minute with  nonspecific ST abnormalities.  Blood pressure was 122/94.   The patient exercised for a total of five minutes 43 seconds to Bruce  protocol stage 2 and 7.0 METs.  Maximum heart rate was 146 beats per minute  which is 87% of predicted maximum.  Maximum blood pressure was 170/100.  The  patient had some mild shortness of breath with the end of exercise.  No  chest pain.  EKG showed no ischemic changes and no arrhythmias.   Final images and results are pending M.D. review.     Amy Mercy Riding, P.A. [facility]                     Vida Roller, M.D.    AB/MEDQ  D:  07/07/2003  T:  07/07/2003  Job:  [id number]

## 2011-05-05 NOTE — Discharge Summary (Signed)
   NAME:  Matthew Molina, Matthew Molina NO.:  [id number]   MEDICAL RECORD NO.:  [id number]                   PATIENT TYPE:  INP   LOCATION:  A209                                 FACILITY:  [facility]   PHYSICIAN:  Gracelyn Nurse, M.D.              DATE OF BIRTH:  [birth year]   DATE OF ADMISSION:  07/06/2003  DATE OF DISCHARGE:  07/07/2003                                 DISCHARGE SUMMARY   DISCHARGE DIAGNOSES:  1. Chest pain.  2. Tobacco abuse.  3. Hypertension.  4. Non-insulin-dependent diabetes mellitus.  5. Schizophrenia.   DISCHARGE MEDICATIONS:  1. Aspirin 81 mg daily.  2. Glucophage 1000 mg b.i.d.  3. Diovan 160/25 mg daily.  4. Lipitor 20 mg daily.  5. Risperdal 1 mg q.a.m. and 2 mg q.p.m.  6. Norvasc 5 mg daily.   PROCEDURES:  Stress Cardiolite, which showed no signs of ischemia.   REASON FOR ADMISSION:  This is a 60 year old black male with a history of  hypertension and diabetes.  This morning he started noticing some heaviness  in his chest, worse after doing some exertion.  He does admit to being short  of breath and having palpitations.   HOSPITAL COURSE:  #1 - CHEST PAIN:  The patient was pain-free after  admission.  Had no other episodes of chest pain or shortness of breath.  Cardiac enzymes were negative.  Cardiology was consulted for stress testing.  He had a stress Cardiolite with results above.  He is to follow up with his  primary care M.D.  He is also to continue taking aspirin, and he was  counseled on smoking cessation.   #2 - DIABETES:  He was maintained on his normal medications.   #3 - SCHIZOPHRENIA:  He was continued on Risperdal, and this was stable.   #4 - HYPERTENSION:  He was continued on his Diovan and Norvasc, and this was  stable during hospital stay.   #5 - TOBACCO ABUSE:  He was counseled about smoking cessation.    DISPOSITION:  The patient is discharged in stable condition.  He is supposed  to follow up with his  primary care physician at his next scheduled  appointment.                                               Gracelyn Nurse, M.D.    JDJ/MEDQ  D:  07/07/2003  T:  07/07/2003  Job:  [id number]   cc:   Milus Mallick. Lodema Hong, M.D.  [address]  [place], [place] [address]  Fax: [phone]   Vida Roller, M.D.  Fax: [phone]

## 2011-05-05 NOTE — Procedures (Signed)
   NAME:  Matthew Molina, Matthew Molina NO.:  [id number]   MEDICAL RECORD NO.:  [id number]                   PATIENT TYPE:  INP   LOCATION:  A209                                 FACILITY:  [facility]   PHYSICIAN:  Vida Roller, M.D.                DATE OF BIRTH:  09-17-1951   DATE OF PROCEDURE:  07/06/2003  DATE OF DISCHARGE:                                  ECHOCARDIOGRAM   TAPE NUMBER:  [id number]   TAPE COUNT:  [id number]   CLINICAL INFORMATION:  This is a gentleman with chest pain, hypertension,  and diabetes.   TECHNICAL QUALITY:  Adequate for interpretation.   M-MODE TRACINGS:  1. The aorta is 37 mm.  2. The left atrium is 49 mm.  3. The septum is 17 mm.  4. The posterior wall is 13 mm.  5. The left ventricular diastolic dimension is 42 mm.  6. The left ventricular systolic dimension is 22 mm.   2-D AND DOPPLER IMAGING:  1. The left ventricle is normal size with normal systolic function.  There     is no wall motion abnormality seen.  Overall diastolic function was not     assessed.  2. The right ventricle was normal size with normal systolic function.  3. Both atria appear to be mildly enlarged, left greater than right.  There     is no atrioseptal defect.  4. The aortic valve is mildly sclerotic with no evidence of stenosis or     regurgitation.  5. The mitral valve is morphologically unremarkable with mild insufficiency.     No stenosis is seen.  6. The tricuspid valve is morphologically unremarkable with mild     insufficiency.  No stenosis is seen.  7. The pulmonic valve was not well seen.  8. The ascending aorta appears to be normal.  9. The inferior vena cava is normal size. There is no evidence of     pericardial effusion.                                               Vida Roller, M.D.    JH/MEDQ  D:  07/06/2003  T:  07/06/2003  Job:  [id number]

## 2011-05-20 ENCOUNTER — Other Ambulatory Visit: Payer: Self-pay | Admitting: Family Medicine

## 2011-08-22 ENCOUNTER — Other Ambulatory Visit: Payer: Self-pay | Admitting: Family Medicine

## 2011-08-25 ENCOUNTER — Encounter: Payer: Self-pay | Admitting: Family Medicine

## 2011-08-28 ENCOUNTER — Encounter: Payer: Self-pay | Admitting: Family Medicine

## 2011-08-28 ENCOUNTER — Ambulatory Visit (INDEPENDENT_AMBULATORY_CARE_PROVIDER_SITE_OTHER): Payer: Medicare PPO | Admitting: Family Medicine

## 2011-08-28 VITALS — BP 128/80 | HR 91 | Resp 16 | Ht 73.5 in | Wt 261.1 lb

## 2011-08-28 DIAGNOSIS — E785 Hyperlipidemia, unspecified: Secondary | ICD-10-CM

## 2011-08-28 DIAGNOSIS — Z23 Encounter for immunization: Secondary | ICD-10-CM

## 2011-08-28 DIAGNOSIS — R5381 Other malaise: Secondary | ICD-10-CM

## 2011-08-28 DIAGNOSIS — F17200 Nicotine dependence, unspecified, uncomplicated: Secondary | ICD-10-CM

## 2011-08-28 DIAGNOSIS — E669 Obesity, unspecified: Secondary | ICD-10-CM

## 2011-08-28 DIAGNOSIS — R5383 Other fatigue: Secondary | ICD-10-CM

## 2011-08-28 DIAGNOSIS — I10 Essential (primary) hypertension: Secondary | ICD-10-CM

## 2011-08-28 DIAGNOSIS — E119 Type 2 diabetes mellitus without complications: Secondary | ICD-10-CM

## 2011-08-28 DIAGNOSIS — Z125 Encounter for screening for malignant neoplasm of prostate: Secondary | ICD-10-CM

## 2011-08-28 MED ORDER — INFLUENZA VAC TYPES A & B PF IM SUSP: 0.5000 mL | Freq: Once | INTRAMUSCULAR | Status: DC

## 2011-08-28 MED ORDER — VALSARTAN 320 MG PO TABS: 320.0000 mg | ORAL_TABLET | Freq: Every day | ORAL | Status: DC

## 2011-08-28 MED ORDER — TRIAMTERENE-HCTZ 37.5-25 MG PO TABS: 1.0000 | ORAL_TABLET | Freq: Every day | ORAL | Status: DC

## 2011-08-28 MED ORDER — RISPERIDONE 4 MG PO TABS: 4.0000 mg | ORAL_TABLET | Freq: Every day | ORAL | Status: DC

## 2011-08-28 MED ORDER — METFORMIN HCL 1000 MG PO TABS: ORAL_TABLET | ORAL | Status: DC

## 2011-08-28 MED ORDER — AMLODIPINE BESYLATE 10 MG PO TABS: 10.0000 mg | ORAL_TABLET | Freq: Every day | ORAL | Status: DC

## 2011-08-28 MED ORDER — GLIPIZIDE ER 10 MG PO TB24: 10.0000 mg | ORAL_TABLET | Freq: Every day | ORAL | Status: DC

## 2011-08-28 MED ORDER — PRAVASTATIN SODIUM 40 MG PO TABS: ORAL_TABLET | ORAL | Status: DC

## 2011-08-28 NOTE — Patient Instructions (Signed)
CPE in march 2013.  Fasting labs October 22 or after.   Fasting labs in March approx 5 days before visit LABWORK  NEEDS TO BE DONE BETWEEN 3 TO 7 DAYS BEFORE YOUR NEXT SCEDULED  VISIT.  THIS WILL IMPROVE THE QUALITY OF YOUR CARE.   Please think about quitting smoking.  This is very important for your health.  Consider setting a quit date, then cutting back or switching brands to prepare to stop.  Also think of the money you will save every day by not smoking.  Quick Tips to Quit Smoking: Fix a date i.e. keep a date in mind from when you would not touch a tobacco product to smoke  Keep yourself busy and block your mind with work loads or reading books or watching movies in malls where smoking is not allowed  Vanish off the things which reminds you about smoking for example match box, or your favorite lighter, or the pipe you used for smoking, or your favorite jeans and shirt with which you used to enjoy smoking, or the club where you used to do smoking  Try to avoid certain people places and incidences where and with whom smoking is a common factor to add on  Praise yourself with some token gifts from the money you saved by stopping smoking  Anti Smoking teams are there to help you. Join their programs  Anti-smoking Gums are there in many medical shops. Try them to quit smoking   Side-effects of Smoking: Disease caused by smoking cigarettes are emphysema, bronchitis, heart failures  Premature death  Cancer is the major side effect of smoking  Heart attacks and strokes are the quick effects of smoking causing sudden death  Some smokers lives end up with limbs amputated  Breathing problem or fast breathing is another side effect of smoking  Due to more intakes of smokes, carbon mono-oxide goes into your brain and other muscles of the body which leads to swelling of the veins and blockage to the air passage to lungs  Carbon monoxide blocks blood vessels which leads to blockage in the flow  of blood to different major body organs like heart lungs and thus leads to attacks and deaths  During pregnancy smoking is very harmful and leads to premature birth of the infant, spontaneous abortions, low weight of the infant during birth  Fat depositions to narrow and blocked blood vessels causing heart attacks  In many cases cigarette smoking caused infertility in men    It is important that you exercise regularly at least 30 minutes 5 times a week. If you develop chest pain, have severe difficulty breathing, or feel very tired, stop exercising immediately and seek medical attention    A healthy diet is rich in fruit, vegetables and whole grains. Poultry fish, nuts and beans are a healthy choice for protein rather then red meat. A low sodium diet and drinking 64 ounces of water daily is generally recommended. Oils and sweet should be limited. Carbohydrates especially for those who are diabetic or overweight, should be limited to 34-45 gram per meal. It is important to eat on a regular schedule, at least 3 times daily. Snacks should be primarily fruits, vegetables or nuts.  Weight loss goal is 10 pounds

## 2011-08-29 NOTE — Assessment & Plan Note (Signed)
Controlled, no change in medication  

## 2011-08-29 NOTE — Progress Notes (Signed)
  Subjective:    Patient ID: Matthew Molina, male    DOB: 26-Jul-1951, 60 y.o.   MRN: [id number]  HPI The PT is here for follow up and re-evaluation of chronic medical conditions, medication management and review of any available recent lab and radiology data.  Preventive health is updated, specifically  Cancer screening and Immunization.   Questions or concerns regarding consultations or procedures which the PT has had in the interim are  addressed. The PT denies any adverse reactions to current medications since the last visit.  There are no new concerns.  There are no specific complaints Reports that fasting sugars are generally around 130. No regular exercise with weight gain, will change this. Still smokes 1PPD no plan to reduce this. Needs podiatry , however states cannot afford this, can't pay his bills       Review of Systems See HPI Denies recent fever or chills. Denies sinus pressure, nasal congestion, ear pain or sore throat. Denies chest congestion, productive cough or wheezing. Denies chest pains, palpitations and leg swelling Denies abdominal pain, nausea, vomiting,diarrhea or constipation.   Denies dysuria, frequency, hesitancy or incontinence. Denies joint pain, swelling and limitation in mobility. Denies headaches, seizures, numbness, or tingling. Denies depression, anxiety or insomnia. Denies skin break down or rash.        Objective:   Physical Exam  Patient alert and oriented and in no cardiopulmonary distress.  HEENT: No facial asymmetry, EOMI, no sinus tenderness,  oropharynx pink and moist.  Neck supple no adenopathy.  Chest: Clear to auscultation bilaterally.Decreased air entry throughout  CVS: S1, S2 no murmurs, no S3.  ABD: Soft non tender. Bowel sounds normal.  Ext: No edema  MS: Adequate ROM spine, shoulders, hips and knees.  Skin: Intact, no ulcerations or rash noted.  Psych: Good eye contact, flat affect. Memory intact not anxious or  depressed appearing.  CNS: CN 2-12 intact, power,  normal throughout. Diabetic Foot Check:  Appearance - calluses Skin - tinea pedis Sensation - grossly intact to light touch Monofilament testing -  Right - Great toe, medial, central, lateral ball and posterior foot diminished Left - Great toe, medial, central, lateral ball and posterior foot diminished Pulses Left - Dorsalis Pedis and Posterior Tibia normal Right - Dorsalis Pedis and Posterior Tibia normal        Assessment & Plan:

## 2011-08-29 NOTE — Assessment & Plan Note (Signed)
Low fat diet discussed and encouraged, labs to be checked in next month

## 2011-08-29 NOTE — Assessment & Plan Note (Signed)
Unchanged, counselled to quit

## 2011-08-29 NOTE — Assessment & Plan Note (Signed)
Deteriorated. Patient re-educated about  the importance of commitment to a  minimum of 150 minutes of exercise per week. The importance of healthy food choices with portion control discussed. Encouraged to start a food diary, count calories and to consider  joining a support group. Sample diet sheets offered. Goals set by the patient for the next several months.    

## 2011-09-11 LAB — DIFFERENTIAL
Basophils Absolute: 0.1
Basophils Relative: 1
Eosinophils Absolute: 0.3
Eosinophils Relative: 4
Lymphocytes Relative: 45
Lymphs Abs: 3.7
Monocytes Absolute: 0.6
Monocytes Relative: 7
Neutro Abs: 3.5
Neutrophils Relative %: 43

## 2011-09-11 LAB — BASIC METABOLIC PANEL
BUN: 4 — ABNORMAL LOW
CO2: 27
Calcium: 9.9
Chloride: 99
Creatinine, Ser: 1.11
GFR calc Af Amer: >60
GFR calc non Af Amer: >60
Glucose, Bld: 132 — ABNORMAL HIGH
Potassium: 3.1 — ABNORMAL LOW
Sodium: 136

## 2011-09-11 LAB — CBC
HCT: 37.8 — ABNORMAL LOW
Hemoglobin: 12.9 — ABNORMAL LOW
MCHC: 34
MCV: 86.2
Platelets: 258
RBC: 4.39
RDW: 13.5
WBC: 8.2

## 2011-09-11 LAB — POCT CARDIAC MARKERS
CKMB, poc: <1 — ABNORMAL LOW
Myoglobin, poc: 143
Operator id: 216221
Troponin i, poc: <0.05

## 2011-09-21 ENCOUNTER — Other Ambulatory Visit: Payer: Self-pay | Admitting: Family Medicine

## 2011-10-31 ENCOUNTER — Encounter: Payer: Self-pay | Admitting: Family Medicine

## 2011-11-01 ENCOUNTER — Encounter: Payer: Self-pay | Admitting: Family Medicine

## 2011-11-01 ENCOUNTER — Ambulatory Visit (INDEPENDENT_AMBULATORY_CARE_PROVIDER_SITE_OTHER): Payer: Medicare PPO | Admitting: Family Medicine

## 2011-11-01 VITALS — BP 130/80 | HR 76 | Resp 16 | Ht 73.5 in | Wt 258.1 lb

## 2011-11-01 DIAGNOSIS — I10 Essential (primary) hypertension: Secondary | ICD-10-CM

## 2011-11-01 DIAGNOSIS — F17200 Nicotine dependence, unspecified, uncomplicated: Secondary | ICD-10-CM

## 2011-11-01 DIAGNOSIS — M549 Dorsalgia, unspecified: Secondary | ICD-10-CM

## 2011-11-01 DIAGNOSIS — R5383 Other fatigue: Secondary | ICD-10-CM

## 2011-11-01 DIAGNOSIS — E785 Hyperlipidemia, unspecified: Secondary | ICD-10-CM

## 2011-11-01 DIAGNOSIS — E119 Type 2 diabetes mellitus without complications: Secondary | ICD-10-CM

## 2011-11-01 DIAGNOSIS — Z125 Encounter for screening for malignant neoplasm of prostate: Secondary | ICD-10-CM

## 2011-11-01 DIAGNOSIS — R5381 Other malaise: Secondary | ICD-10-CM

## 2011-11-01 MED ORDER — HYDROCODONE-ACETAMINOPHEN 5-500 MG PO TABS: 1.0000 | ORAL_TABLET | Freq: Two times a day (BID) | ORAL | Status: DC

## 2011-11-01 MED ORDER — KETOROLAC TROMETHAMINE 60 MG/2ML IM SOLN
60.0000 mg | Freq: Once | INTRAMUSCULAR | Status: AC
Start: 2011-11-01 — End: 2011-11-01
  Administered 2011-11-01: 60 mg via INTRAMUSCULAR

## 2011-11-01 MED ORDER — PREDNISONE (PAK) 5 MG PO TABS: 5.0000 mg | ORAL_TABLET | ORAL | Status: AC

## 2011-11-01 MED ORDER — METHYLPREDNISOLONE ACETATE PF 80 MG/ML IJ SUSP
80.0000 mg | Freq: Once | INTRAMUSCULAR | Status: AC
  Administered 2011-11-01: 80 mg via INTRAMUSCULAR

## 2011-11-01 NOTE — Patient Instructions (Addendum)
F/U in 4.5 month   Labs today, cbc, cmp and egfr, tsh , HBA1C and PSA today  Fasting labs in 4.5 months just before next visit  Fasting lipid and HBa1C in 4.5 month    Vicodin and prednisone dose pack sent in for back pain  You need to stop smoking  You will get printed info on strokes

## 2011-11-01 NOTE — Progress Notes (Signed)
  Subjective:    Patient ID: Matthew Molina, male    DOB: December 24, 1950, 60 y.o.   MRN: [id number]  HPI 1 month h/o uncontrolled low back pain radiating down left buttock and left leg. Denies weakness or numbness  And denies incontinenence of stool or urine. Is requesting stronger pain pills and also has a form for a handicap sticker.Reports taking betweeen 6 to 8 prescription strength ibuprofenon some days. i advised against this due to increased risk of gI upset Today for the first time he has a cane. No inciting trauma  To bring on symptoms  No other stated concerns, except also wants referral to podiatry Still smokes with no plans on quitting    Review of Systems See HPI Denies recent fever or chills. Denies sinus pressure, nasal congestion, ear pain or sore throat. Denies chest congestion, productive cough or wheezing. Denies chest pains, palpitations and leg swelling Denies abdominal pain, nausea, vomiting,diarrhea or constipation.   Denies dysuria, frequency, hesitancy or incontinence. Denies headaches, seizures, numbness, or tingling. Denies uncontrolled  depression, anxiety or insomnia.Denies hallucinations Denies skin break down or rash.        Objective:   Physical Exam Patient alert and oriented and in no cardiopulmonary distress.  HEENT: No facial asymmetry, EOMI, no sinus tenderness,  oropharynx pink and moist.  Neck supple no adenopathy.  Chest: Clear to auscultation bilaterally.  CVS: S1, S2 no murmurs, no S3.  ABD: Soft non tender. Bowel sounds normal.  Ext: No edema  [id number]  ROM spine,adequate in  shoulders, hips and knees.  Skin: Intact, no ulcerations or rash noted.  Psych: Good eye contact, flat  affect. Memory mildyi impaired not anxious or depressed appearing.  CNS: CN 2-12 intact, power, tone and sensation normal throughout.        Assessment & Plan:

## 2011-11-02 LAB — CBC WITH DIFFERENTIAL/PLATELET
Basophils Absolute: 0 10*3/uL (ref 0.0–0.1)
Basophils Relative: 0 % (ref 0–1)
Eosinophils Absolute: 0.2 10*3/uL (ref 0.0–0.7)
Eosinophils Relative: 3 % (ref 0–5)
HCT: 43.4 % (ref 39.0–52.0)
Hemoglobin: 14.6 g/dL (ref 13.0–17.0)
Lymphocytes Relative: 45 % (ref 12–46)
Lymphs Abs: 3.2 10*3/uL (ref 0.7–4.0)
MCH: 30.4 pg (ref 26.0–34.0)
MCHC: 33.6 g/dL (ref 30.0–36.0)
MCV: 90.4 fL (ref 78.0–100.0)
Monocytes Absolute: 0.5 10*3/uL (ref 0.1–1.0)
Monocytes Relative: 7 % (ref 3–12)
Neutro Abs: 3.1 10*3/uL (ref 1.7–7.7)
Neutrophils Relative %: 45 % (ref 43–77)
Platelets: 234 10*3/uL (ref 150–400)
RBC: 4.8 MIL/uL (ref 4.22–5.81)
RDW: 14.5 % (ref 11.5–15.5)
WBC: 7 10*3/uL (ref 4.0–10.5)

## 2011-11-02 LAB — COMPLETE METABOLIC PANEL WITH GFR
ALT: 27 U/L (ref 0–53)
AST: 27 U/L (ref 0–37)
Albumin: 4.9 g/dL (ref 3.5–5.2)
Alkaline Phosphatase: 48 U/L (ref 39–117)
BUN: 8 mg/dL (ref 6–23)
CO2: 27 mEq/L (ref 19–32)
Calcium: 10.8 mg/dL — ABNORMAL HIGH (ref 8.4–10.5)
Chloride: 101 mEq/L (ref 96–112)
Creat: 1.11 mg/dL (ref 0.50–1.35)
GFR, Est African American: 82 mL/min/{1.73_m2}
GFR, Est Non African American: 71 mL/min/{1.73_m2}
Glucose, Bld: 76 mg/dL (ref 70–99)
Potassium: 3.6 mEq/L (ref 3.5–5.3)
Sodium: 140 mEq/L (ref 135–145)
Total Bilirubin: 0.4 mg/dL (ref 0.3–1.2)
Total Protein: 7.8 g/dL (ref 6.0–8.3)

## 2011-11-02 LAB — PSA, MEDICARE: PSA: 2.62 ng/mL (ref <=4.00)

## 2011-11-02 LAB — TSH: TSH: 2.131 u[IU]/mL (ref 0.350–4.500)

## 2011-11-02 LAB — HEMOGLOBIN A1C
Hgb A1c MFr Bld: 5.9 % — ABNORMAL HIGH (ref <5.7)
Mean Plasma Glucose: 123 mg/dL — ABNORMAL HIGH (ref <117)

## 2011-11-02 NOTE — Assessment & Plan Note (Signed)
Controlled no change in med

## 2011-11-02 NOTE — Assessment & Plan Note (Signed)
Hyperlipidemia:Low fat diet discussed and encouraged.  Updated lab needed, continue current med, uncontrolled when last checked however

## 2011-11-02 NOTE — Assessment & Plan Note (Signed)
Deteriorated, injections administered, med sent to pharmacy and handicap sticker signed

## 2011-11-02 NOTE — Assessment & Plan Note (Signed)
Unchanged, no plan to quit, counseled to do so 

## 2011-11-02 NOTE — Assessment & Plan Note (Signed)
Controlled, no change in medication  

## 2011-11-14 ENCOUNTER — Other Ambulatory Visit: Payer: Self-pay | Admitting: Family Medicine

## 2011-11-20 ENCOUNTER — Other Ambulatory Visit: Payer: Self-pay | Admitting: Family Medicine

## 2011-11-22 ENCOUNTER — Other Ambulatory Visit: Payer: Self-pay

## 2011-11-22 MED ORDER — HYDROCODONE-ACETAMINOPHEN 5-500 MG PO TABS: 1.0000 | ORAL_TABLET | Freq: Two times a day (BID) | ORAL | Status: DC

## 2011-11-22 MED ORDER — IBUPROFEN 800 MG PO TABS: 800.0000 mg | ORAL_TABLET | Freq: Three times a day (TID) | ORAL | Status: DC | PRN

## 2011-11-22 NOTE — Telephone Encounter (Signed)
Received order to refill both prescriptions of Motrin and Vicodin.  Prescriptions sent to [facility].

## 2011-11-22 NOTE — Telephone Encounter (Signed)
There is a hold on his Vicodin and Motrin. Do you want to refill?

## 2011-11-22 NOTE — Telephone Encounter (Signed)
I thought I signed the vicodin script already or at least ok'd this , prob Gearldine Bienenstock has that message as a flag, anyhow, pls refill both

## 2011-11-28 ENCOUNTER — Telehealth: Payer: Self-pay | Admitting: Family Medicine

## 2011-11-29 NOTE — Telephone Encounter (Signed)
Filled out for Dr to sign and will fax back

## 2011-12-25 ENCOUNTER — Other Ambulatory Visit: Payer: Self-pay | Admitting: Family Medicine

## 2012-01-25 ENCOUNTER — Other Ambulatory Visit: Payer: Self-pay | Admitting: Family Medicine

## 2012-02-26 ENCOUNTER — Encounter: Payer: Self-pay | Admitting: Family Medicine

## 2012-02-26 ENCOUNTER — Ambulatory Visit (INDEPENDENT_AMBULATORY_CARE_PROVIDER_SITE_OTHER): Payer: Medicare Other | Admitting: Family Medicine

## 2012-02-26 VITALS — BP 110/70 | HR 106 | Resp 16 | Ht 73.5 in | Wt 251.0 lb

## 2012-02-26 DIAGNOSIS — I10 Essential (primary) hypertension: Secondary | ICD-10-CM

## 2012-02-26 DIAGNOSIS — E1169 Type 2 diabetes mellitus with other specified complication: Secondary | ICD-10-CM

## 2012-02-26 DIAGNOSIS — F17200 Nicotine dependence, unspecified, uncomplicated: Secondary | ICD-10-CM

## 2012-02-26 DIAGNOSIS — E669 Obesity, unspecified: Secondary | ICD-10-CM

## 2012-02-26 DIAGNOSIS — J441 Chronic obstructive pulmonary disease with (acute) exacerbation: Secondary | ICD-10-CM | POA: Insufficient documentation

## 2012-02-26 DIAGNOSIS — J449 Chronic obstructive pulmonary disease, unspecified: Secondary | ICD-10-CM

## 2012-02-26 DIAGNOSIS — E119 Type 2 diabetes mellitus without complications: Secondary | ICD-10-CM

## 2012-02-26 DIAGNOSIS — R51 Headache: Secondary | ICD-10-CM

## 2012-02-26 DIAGNOSIS — Z0000 Encounter for general adult medical examination without abnormal findings: Secondary | ICD-10-CM

## 2012-02-26 MED ORDER — IBUPROFEN 800 MG PO TABS: 800.0000 mg | ORAL_TABLET | Freq: Three times a day (TID) | ORAL | Status: DC | PRN

## 2012-02-26 MED ORDER — TIOTROPIUM BROMIDE MONOHYDRATE 18 MCG IN CAPS: 18.0000 ug | ORAL_CAPSULE | Freq: Every day | RESPIRATORY_TRACT | Status: DC

## 2012-02-26 MED ORDER — BUDESONIDE-FORMOTEROL FUMARATE 80-4.5 MCG/ACT IN AERO: 2.0000 | INHALATION_SPRAY | Freq: Two times a day (BID) | RESPIRATORY_TRACT | Status: DC

## 2012-02-26 NOTE — Patient Instructions (Signed)
F/u in 4 month.  2 new medications for your breathing.  You need to cut back and quit smoking you have a 50 pack year history and are having increased problems breathing.  You need your colonoscopy, we will give you information on the importance of this  Pls get labs this week past due.  You need to see podiatry for foot care  Headache is tension and stress related, try to work on lowering how stressed you feel

## 2012-02-27 ENCOUNTER — Encounter: Payer: Medicare PPO | Admitting: Family Medicine

## 2012-03-01 ENCOUNTER — Telehealth: Payer: Self-pay | Admitting: Family Medicine

## 2012-03-01 NOTE — Telephone Encounter (Signed)
Does this patient qualify?

## 2012-03-03 NOTE — Progress Notes (Signed)
  Subjective:    Patient ID: Matthew Molina, male    DOB: [birth year], 61 y.o.   MRN: [id number]  HPI The PT is here for annual exam and re-evaluation of chronic medical conditions, medication management and review of any available recent lab and radiology data.  Preventive health is updated, specifically  Cancer screening and Immunization.   Questions or concerns regarding consultations or procedures which the PT has had in the interim are  addressed. The PT denies any adverse reactions to current medications since the last visit.  C/o increased frequency and severity of headaches, mainly frontal and bitemporal , experienced as a tight band, in the last several months.Denies vision change, weakness or numbness. C/o increased shortness of breath, not using any inhalers regularly despite ongoing nicotine use, he is encouraged once more o quit. Requesting neb machine , however I explained the need to start with inhalers initially. C/o lower abdominal pain, denies change in bowel movement, still has not had colonoscopy, willing o reconsider this. Denies nausea or vomting    Review of Systems See HPI Denies recent fever or chills. Denies sinus pressure, nasal congestion, ear pain or sore throat. Denies chest congestion, productive cough, c/o shotness  Of breath with activity, and denies wheezing. Denies chest pains, palpitations and leg swelling .   Denies dysuria, frequency, hesitancy or incontinence. Denies joint pain, swelling and limitation in mobility. Denies seizures, numbness, or tingling. Denies uncontrolled  depression, anxiety or insomnia. Denies skin break down or rash.        Objective:   Physical Exam Pleasant obese male, alert and oriented x 3, in no cardio-pulmonary distress. Afebrile. HEENT No facial trauma or asymetry. Sinuses non tender. EOMI, PERTL, fundoscopic exam is negative for hemorhages or exudates. External ears normal, tympanic membranes clear. Oropharynx  moist, no exudate, poor  dentition. Neck: decreased ROM, no adenopathy,JVD or thyromegaly.No bruits.  Chest: Clear to ascultation bilaterally.No crackles or wheezes. Decreased air entry bilaterally Non tender to palpation  Breast: No asymetry,no masses. No nipple discharge or inversion. No axillary or supraclavicular adenopathy  Cardiovascular system; Heart sounds normal,  S1 and  S2 ,no S3.  No murmur, or thrill. Apical beat not displaced Peripheral pulses normal.  Abdomen: Soft, non tender, no organomegaly or masses. No bruits. Bowel sounds normal. No guarding, tenderness or rebound.  Rectal:  No mass. guaiac negative stool. Prostate smooth and firm, enlarged   Musculoskeletal exam: Full ROM of spine, hips , shoulders and knees. No deformity ,swelling or crepitus noted. No muscle wasting or atrophy.   Neurologic: Cranial nerves 2 to 12 intact. Power, tone  and reflexes normal throughout. No disturbance in gait. No tremor.  Skin: Intact, no ulceration,  noted. Pigmentation normal throughout  Psych; Normal mood and blunted  affect. Judgement and concentration normal         Assessment & Plan:

## 2012-03-03 NOTE — Assessment & Plan Note (Signed)
1 PPD no interest in quitting

## 2012-03-03 NOTE — Assessment & Plan Note (Signed)
Increased frequency and severity in the past month, alos reports increasing stress and worry primarily due to economic challenges

## 2012-03-03 NOTE — Assessment & Plan Note (Signed)
Las past due , pt to get same asap

## 2012-03-03 NOTE — Assessment & Plan Note (Signed)
Deteriorating, pt to start daily inhalers, had requested nb machine but not indicated pulmonary function tests declined at this time reporting economic challenges

## 2012-03-03 NOTE — Assessment & Plan Note (Signed)
Controlled, no change in medication  

## 2012-03-04 NOTE — Telephone Encounter (Signed)
pls advise since he is able to dress, and feed himself and take care of basic daily needs I do not see a medical need and do not know how to direct him further

## 2012-03-05 NOTE — Telephone Encounter (Signed)
Patient aware.

## 2012-03-11 ENCOUNTER — Encounter (HOSPITAL_COMMUNITY): Payer: Self-pay | Admitting: *Deleted

## 2012-03-11 ENCOUNTER — Emergency Department (HOSPITAL_COMMUNITY): Payer: Medicare Other

## 2012-03-11 ENCOUNTER — Emergency Department (HOSPITAL_COMMUNITY)
Admission: EM | Admit: 2012-03-11 | Discharge: 2012-03-12 | Disposition: A | Discharge status: Home / Self Care | Payer: Medicare Other | Attending: Emergency Medicine | Admitting: Emergency Medicine

## 2012-03-11 ENCOUNTER — Other Ambulatory Visit: Payer: Self-pay

## 2012-03-11 DIAGNOSIS — E119 Type 2 diabetes mellitus without complications: Secondary | ICD-10-CM | POA: Insufficient documentation

## 2012-03-11 DIAGNOSIS — R079 Chest pain, unspecified: Secondary | ICD-10-CM | POA: Insufficient documentation

## 2012-03-11 DIAGNOSIS — I10 Essential (primary) hypertension: Secondary | ICD-10-CM | POA: Insufficient documentation

## 2012-03-11 DIAGNOSIS — Z8659 Personal history of other mental and behavioral disorders: Secondary | ICD-10-CM | POA: Insufficient documentation

## 2012-03-11 DIAGNOSIS — Z79899 Other long term (current) drug therapy: Secondary | ICD-10-CM | POA: Insufficient documentation

## 2012-03-11 DIAGNOSIS — E785 Hyperlipidemia, unspecified: Secondary | ICD-10-CM | POA: Insufficient documentation

## 2012-03-11 DIAGNOSIS — Z7982 Long term (current) use of aspirin: Secondary | ICD-10-CM | POA: Insufficient documentation

## 2012-03-11 LAB — CBC
HCT: 41.1 % (ref 39.0–52.0)
Hemoglobin: 13.7 g/dL (ref 13.0–17.0)
MCH: 29.7 pg (ref 26.0–34.0)
MCHC: 33.3 g/dL (ref 30.0–36.0)
MCV: 89.2 fL (ref 78.0–100.0)
Platelets: 219 10*3/uL (ref 150–400)
RBC: 4.61 MIL/uL (ref 4.22–5.81)
RDW: 13.9 % (ref 11.5–15.5)
WBC: 6.9 10*3/uL (ref 4.0–10.5)

## 2012-03-11 LAB — BASIC METABOLIC PANEL
BUN: 9 mg/dL (ref 6–23)
CO2: 27 mEq/L (ref 19–32)
Calcium: 10.5 mg/dL (ref 8.4–10.5)
Chloride: 97 mEq/L (ref 96–112)
Creatinine, Ser: 1.06 mg/dL (ref 0.50–1.35)
GFR calc Af Amer: 85 mL/min — ABNORMAL LOW (ref >90)
GFR calc non Af Amer: 73 mL/min — ABNORMAL LOW (ref >90)
Glucose, Bld: 102 mg/dL — ABNORMAL HIGH (ref 70–99)
Potassium: 3.2 mEq/L — ABNORMAL LOW (ref 3.5–5.1)
Sodium: 137 mEq/L (ref 135–145)

## 2012-03-11 LAB — D-DIMER, QUANTITATIVE: D-Dimer, Quant: 0.3 ug/mL-FEU (ref 0.00–0.48)

## 2012-03-11 LAB — TROPONIN I: Troponin I: <0.3 ng/mL (ref <0.30)

## 2012-03-11 MED ORDER — SODIUM CHLORIDE 0.9 % IV SOLN
INTRAVENOUS | Status: DC
  Administered 2012-03-11: 23:00:00 via INTRAVENOUS

## 2012-03-11 MED ORDER — HYDROCODONE-ACETAMINOPHEN 5-325 MG PO TABS: 1.0000 | ORAL_TABLET | Freq: Four times a day (QID) | ORAL | Status: AC | PRN

## 2012-03-11 MED ORDER — ASPIRIN 325 MG PO TABS
325.0000 mg | ORAL_TABLET | Freq: Once | ORAL | Status: AC
  Administered 2012-03-11: 325 mg via ORAL
  Filled 2012-03-11: qty 1

## 2012-03-11 MED ORDER — HYDROMORPHONE HCL PF 1 MG/ML IJ SOLN
1.0000 mg | Freq: Once | INTRAMUSCULAR | Status: AC
  Administered 2012-03-11: 1 mg via INTRAVENOUS
  Filled 2012-03-11: qty 1

## 2012-03-11 NOTE — ED Notes (Signed)
Pt reports pain began several days ago.  Reports pain in mid to left chest.  No radiation into neck or shoulder.  Pt denies nausea or vomiting.  No distress noted at present time.

## 2012-03-11 NOTE — ED Provider Notes (Signed)
History   This chart was scribed for Matthew Jakes, MD by Sofie Rower. The patient was seen in room APA02/APA02 and the patient's care was started at 10:35PM.    CSN: [id number]  Arrival date & time 03/11/12  2134   First MD Initiated Contact with Patient 03/11/12 2229      Chief Complaint  Patient presents with  . Chest Pain    (Consider location/radiation/quality/duration/timing/severity/associated sxs/prior treatment) HPI  Matthew Molina is a 61 y.o. male who presents to the Emergency Department complaining of moderate, episodic chest pain located sub sternally onset two days ago. Pt states "the pain is sharp". Modifying factors include lying down which intensifies the pain. Pt has a hx of high lipids, hypertension, diabetes.   Pt denies SOB, nausea, vomiting, MI, stents, headache, neck pain, sore throat, cough, abd pain, diarrhea, back pain, dysuria, swelling in the legs, rash, hx of bleeding problems.   PCP is Dr. Lodema Hong.   Past Medical History  Diagnosis Date  . Nicotine addiction   . Schizophrenia   . Hyperlipidemia   . Obesity   . Hypertension   . Diabetes mellitus     History reviewed. No pertinent past surgical history.  Family History  Problem Relation Age of Onset  . Diabetes Mother   . Hypertension Mother   . Stroke Mother     History  Substance Use Topics  . Smoking status: Current Everyday Smoker -- 1.0 packs/day    Types: Cigarettes  . Smokeless tobacco: Not on file  . Alcohol Use: No      Review of Systems  All other systems reviewed and are negative.    10 Systems reviewed and are negative for acute change except as noted in the HPI.   Allergies  Zoloft  Home Medications   Current Outpatient Rx  Name Route Sig Dispense Refill  . AMLODIPINE BESYLATE 10 MG PO TABS Oral Take 1 tablet (10 mg total) by mouth daily. 30 tablet 6  . ASPIRIN 325 MG PO TABS Oral Take 325 mg by mouth daily.      . BUDESONIDE-FORMOTEROL FUMARATE 80-4.5  MCG/ACT IN AERO Inhalation Inhale 2 puffs into the lungs 2 (two) times daily. 1 Inhaler 12  . CALCIUM CARBONATE-VITAMIN D 500-200 MG-UNIT PO TABS Oral Take 1 tablet by mouth 2 (two) times daily.      Marland Kitchen DIPHENHYDRAMINE HCL 25 MG PO TABS Oral Take 25 mg by mouth at bedtime.      Marland Kitchen GLIPIZIDE ER 10 MG PO TB24 Oral Take 1 tablet (10 mg total) by mouth daily. 60 tablet 6  . IBUPROFEN 800 MG PO TABS Oral Take 1 tablet (800 mg total) by mouth every 8 (eight) hours as needed for pain. 30 tablet 3  . METFORMIN HCL 1000 MG PO TABS  TAKE 1 TABLET BY MOUTH   TWICE DAILY WITH FOOD FORDIABETES. 60 tablet 6  . OXYMETAZOLINE HCL 0.05 % NA SOLN Nasal Place 2 sprays into the nose daily as needed. For nasal congestion    . PRAVASTATIN SODIUM 40 MG PO TABS  TAKE (1) TABLET BY MOUTH AT BEDTIME FORCHOLESTEROL. 30 tablet 6  . RISPERIDONE 4 MG PO TABS Oral Take 4 mg by mouth at bedtime.    Marland Kitchen TIOTROPIUM BROMIDE MONOHYDRATE 18 MCG IN CAPS Inhalation Place 1 capsule (18 mcg total) into inhaler and inhale daily. 30 capsule 2  . TRIAMTERENE-HCTZ 37.5-25 MG PO TABS Oral Take 1 each (1 tablet total) by mouth daily. 30 tablet  6  . VALSARTAN 320 MG PO TABS Oral Take 1 tablet (320 mg total) by mouth daily. 30 tablet 6  . BUTALBITAL-APAP-CAFF-COD 50-325-40-30 MG PO CAPS Oral Take 1 capsule by mouth. Take 1 tablet by mouth two times a day as needed, max twice weekly     . HYDROCODONE-ACETAMINOPHEN 5-325 MG PO TABS Oral Take 1-2 tablets by mouth every 6 (six) hours as needed for pain. 10 tablet 0  . HYDROCODONE-ACETAMINOPHEN 5-500 MG PO TABS Oral Take 1 tablet by mouth 2 (two) times daily. 20 tablet 0    BP 143/96  Pulse 88  Temp(Src) 98.1 F (36.7 C) (Oral)  Resp 20  Ht 6\' 1"  (1.854 m)  Wt 242 lb (109.77 kg)  BMI 31.93 kg/m2  SpO2 98%  Physical Exam  Nursing note and vitals reviewed. Constitutional: He is oriented to person, place, and time. He appears well-developed and well-nourished.  HENT:  Head: Normocephalic and  atraumatic.  Right Ear: External ear normal.  Left Ear: External ear normal.  Nose: Nose normal.  Eyes: Conjunctivae and EOM are normal. No scleral icterus.  Neck: Normal range of motion. Neck supple. No thyromegaly present.  Cardiovascular: Normal rate, regular rhythm and normal heart sounds.  Exam reveals no gallop and no friction rub.   No murmur heard. Pulmonary/Chest: Breath sounds normal. No stridor. He has no wheezes. He has no rales. He exhibits no tenderness.  Abdominal: Bowel sounds are normal. He exhibits no distension. There is no tenderness. There is no rebound.  Musculoskeletal: Normal range of motion. He exhibits no edema.  Lymphadenopathy:    He has no cervical adenopathy.  Neurological: He is oriented to person, place, and time. Coordination normal.  Skin: Skin is warm and dry. No rash noted. No erythema.  Psychiatric: He has a normal mood and affect. His behavior is normal.    ED Course  Procedures (including critical care time)  DIAGNOSTIC STUDIES: Oxygen Saturation is 98% on room air, normal by my interpretation.    COORDINATION OF CARE:   Date: 03/11/2012  Rate: 76  Rhythm: normal sinus rhythm  QRS Axis: normal  Intervals: normal  ST/T Wave abnormalities: nonspecific ST/T changes  Conduction Disutrbances:left anterior fascicular block  Narrative Interpretation:   Old EKG Reviewed: unchanged No significant changes in EKG from 02/22/2008.    Results for orders placed during the hospital encounter of 03/11/12  CBC      Component Value Range   WBC 6.9  4.0 - 10.5 (K/uL)   RBC 4.61  4.22 - 5.81 (MIL/uL)   Hemoglobin 13.7  13.0 - 17.0 (g/dL)   HCT 16.1  09.6 - 04.5 (%)   MCV 89.2  78.0 - 100.0 (fL)   MCH 29.7  26.0 - 34.0 (pg)   MCHC 33.3  30.0 - 36.0 (g/dL)   RDW 40.9  81.1 - 91.4 (%)   Platelets 219  150 - 400 (K/uL)  TROPONIN I      Component Value Range   Troponin I <0.30  <0.30 (ng/mL)  BASIC METABOLIC PANEL      Component Value Range    Sodium 137  135 - 145 (mEq/L)   Potassium 3.2 (*) 3.5 - 5.1 (mEq/L)   Chloride 97  96 - 112 (mEq/L)   CO2 27  19 - 32 (mEq/L)   Glucose, Bld 102 (*) 70 - 99 (mg/dL)   BUN 9  6 - 23 (mg/dL)   Creatinine, Ser 7.82  0.50 - 1.35 (mg/dL)   Calcium  10.5  8.4 - 10.5 (mg/dL)   GFR calc non Af Amer 73 (*) >90 (mL/min)   GFR calc Af Amer 85 (*) >90 (mL/min)  D-DIMER, QUANTITATIVE      Component Value Range   D-Dimer, Quant 0.30  0.00 - 0.48 (ug/mL-FEU)   Dg Chest 2 View  03/11/2012  *RADIOLOGY REPORT*  Clinical Data: 61 year old male with chest pain.  CHEST - 2 VIEW  Comparison: 04/18/2011  Findings: Upper limits normal heart size again noted. Mild peribronchial thickening is stable. There is no evidence of focal airspace disease, pulmonary edema, suspicious pulmonary nodule/mass, pleural effusion, or pneumothorax. No acute bony abnormalities are identified.  IMPRESSION: No evidence of acute cardiopulmonary disease.  Original Report Authenticated By: Rosendo Gros, M.D.      1. Chest pain     10:40PM- EDP at bedside discusses treatment plan.   MDM  Patient with onset of substernal chest pain 2 days ago intermittent. First cardiac marker negative which is significant due to the duration of the pain. D-dimer still pending to rule out possible pulmonary embolism. Chest x-ray without significant findings no evidence of pneumonia or pneumothorax. If d-dimer negative patient can be discharged home with pain medicine and followup with his primary care Dr. Dr. Lodema Hong.  Chest x-ray is negative troponin is negative EKG without acute findings d-dimer is negative not consistent with pulmonary embolism.        I personally performed the services described in this documentation, which was scribed in my presence. The recorded information has been reviewed and considered.        Matthew Jakes, MD 03/11/12 [phone]

## 2012-03-11 NOTE — Discharge Instructions (Signed)
Workup today for your chest pain is been negative. Followup with your primary care doctor in the next few days. Recommend you start taking a baby aspirin a day. Hydrocodone prescription given for more severe pain. Take as directed. Return for new or worse symptoms.  Aspirin and Your Heart Aspirin affects the way your blood clots and helps "thin" the blood. Aspirin has many uses in heart disease. It may be used as a primary prevention to help reduce the risk of heart related events. It also can be used as a secondary measure to prevent more heart attacks or to prevent additional damage from blood clots.  ASPIRIN MAY HELP IF YOU:  Have had a heart attack or chest pain.   Have undergone open heart surgery such as CABG (Coronary Artery Bypass Surgery).   Have had coronary angioplasty with or without stents.   Have experienced a stroke or TIA (transient ischemic attack).   Have peripheral vascular disease (PAD).   Have chronic heart rhythm problems such as atrial fibrillation.   Are at risk for heart disease.  BEFORE STARTING ASPIRIN Before you start taking aspirin, your caregiver will need to review your medical history. Many things will need to be taken into consideration, such as:  Smoking status.   Blood pressure.   Diabetes.   Gender.   Weight.   Cholesterol level.  ASPIRIN DOSES  Aspirin should only be taken on the advice of your caregiver. Talk to your caregiver about how much aspirin you should take. Aspirin comes in different doses such as:   81 mg.   162 mg.   325 mg.   The aspirin dose you take may be affected by many factors, some of which include:   Your current medications, especially if your are taking blood-thinners or anti-platelet medicine.   Liver function.   Heart disease risk.   Age.   Aspirin comes in two forms:   Non-enteric-coated. This type of aspirin does not have a coating and is absorbed faster. Non-enteric coated aspirin is recommended  for patients experiencing chest pain symptoms. This type of aspirin also comes in a chewable form.   Enteric-coated. This means the aspirin has a special coating that releases the medicine very slowly. Enteric-coated aspirin causes less stomach upset. This type of aspirin should not be chewed or crushed.  ASPIRIN SIDE EFFECTS Daily use of aspirin can increase your risk of serious side effects, some of these include:  Increased bleeding. This can range from a cut that does not stop bleeding to more serious problems such as stomach bleeding or bleeding into the brain (Intracerebral bleeding).   Increased bruising.   Stomach upset.   An allergic reaction such as red, itchy skin.   Increased risk of bleeding when combined with non-steroidal anti-inflammatory medicine (NSAIDS).   Alcohol should be drank in moderation when taking aspirin. Alcohol can increase the risk of stomach bleeding when taken with aspirin.   Aspirin should not be given to children less than 16 years of age due to the association of Reye syndrome. Reye syndrome is a serious illness that can affect the brain and liver. Studies have linked Reye syndrome with aspirin use in children.   People that have nasal polyps have an increased risk of developing an aspirin allergy.  SEEK MEDICAL CARE IF:   You develop an allergic reaction such as:   Hives.   Itchy skin.   Swelling of the lips, tongue or face.   You develop stomach pain.  You have unusual bleeding or bruising.   You have ringing in your ears.  SEEK IMMEDIATE MEDICAL CARE IF:   You have severe chest pain, especially if the pain is crushing or pressure-like and spreads to the arms, back, neck, or jaw. THIS IS AN EMERGENCY. Do not wait to see if the pain will go away. Get medical help at once. Call your local emergency services (911 in the [place] DO NOT drive yourself to the hospital.   You have stroke-like symptoms such as:   Loss of vision.   Difficulty  talking.   Numbness or weakness on one side of your body.   Numbness or weakness in your arm or leg.   Not thinking clearly or feeling confused.   Your bowel movements are bloody, dark red or black in color.   You vomit or cough up blood.   You have blood in your urine.   You have shortness of breath, coughing or wheezing.  MAKE SURE YOU:   Understand these instructions.   Will monitor your condition.   Seek immediate medical care if necessary.  Document Released: 11/16/2008 Document Revised: 11/23/2011 Document Reviewed: 11/16/2008 [facility] Patient Information 2012 [place], [place].

## 2012-03-11 NOTE — ED Notes (Signed)
Chest pain onset days ago

## 2012-05-04 LAB — HEMOGLOBIN A1C
Hgb A1c MFr Bld: 6.3 % — ABNORMAL HIGH (ref <5.7)
Mean Plasma Glucose: 134 mg/dL — ABNORMAL HIGH (ref <117)

## 2012-05-04 LAB — LIPID PANEL
Cholesterol: 139 mg/dL (ref 0–200)
HDL: 31 mg/dL — ABNORMAL LOW (ref >39)
LDL Cholesterol: 41 mg/dL (ref 0–99)
Total CHOL/HDL Ratio: 4.5 Ratio
Triglycerides: 336 mg/dL — ABNORMAL HIGH (ref <150)
VLDL: 67 mg/dL — ABNORMAL HIGH (ref 0–40)

## 2012-06-15 ENCOUNTER — Other Ambulatory Visit: Payer: Self-pay | Admitting: Family Medicine

## 2012-06-17 ENCOUNTER — Other Ambulatory Visit: Payer: Self-pay | Admitting: Family Medicine

## 2012-06-27 ENCOUNTER — Ambulatory Visit: Payer: Medicare Other | Admitting: Family Medicine

## 2012-07-04 ENCOUNTER — Encounter: Payer: Self-pay | Admitting: Family Medicine

## 2012-07-04 ENCOUNTER — Ambulatory Visit (INDEPENDENT_AMBULATORY_CARE_PROVIDER_SITE_OTHER): Payer: Medicare Other | Admitting: Family Medicine

## 2012-07-04 VITALS — BP 110/74 | HR 86 | Resp 16 | Ht 73.5 in | Wt 252.1 lb

## 2012-07-04 DIAGNOSIS — E669 Obesity, unspecified: Secondary | ICD-10-CM

## 2012-07-04 DIAGNOSIS — F17200 Nicotine dependence, unspecified, uncomplicated: Secondary | ICD-10-CM

## 2012-07-04 DIAGNOSIS — E785 Hyperlipidemia, unspecified: Secondary | ICD-10-CM

## 2012-07-04 DIAGNOSIS — E119 Type 2 diabetes mellitus without complications: Secondary | ICD-10-CM

## 2012-07-04 DIAGNOSIS — F209 Schizophrenia, unspecified: Secondary | ICD-10-CM

## 2012-07-04 DIAGNOSIS — Z125 Encounter for screening for malignant neoplasm of prostate: Secondary | ICD-10-CM

## 2012-07-04 DIAGNOSIS — I10 Essential (primary) hypertension: Secondary | ICD-10-CM

## 2012-07-04 DIAGNOSIS — F411 Generalized anxiety disorder: Secondary | ICD-10-CM

## 2012-07-04 DIAGNOSIS — E1169 Type 2 diabetes mellitus with other specified complication: Secondary | ICD-10-CM

## 2012-07-04 LAB — POC HEMOCCULT BLD/STL (OFFICE/1-CARD/DIAGNOSTIC): Fecal Occult Blood, POC: NEGATIVE

## 2012-07-04 MED ORDER — AMLODIPINE BESYLATE 10 MG PO TABS: 10.0000 mg | ORAL_TABLET | Freq: Every day | ORAL | Status: DC

## 2012-07-04 MED ORDER — TRIAMTERENE-HCTZ 37.5-25 MG PO TABS: 1.0000 | ORAL_TABLET | Freq: Every day | ORAL | Status: DC

## 2012-07-04 MED ORDER — VALSARTAN 320 MG PO TABS: 320.0000 mg | ORAL_TABLET | Freq: Every day | ORAL | Status: DC

## 2012-07-04 MED ORDER — METFORMIN HCL 1000 MG PO TABS: ORAL_TABLET | ORAL | Status: DC

## 2012-07-04 MED ORDER — BUSPIRONE HCL 7.5 MG PO TABS: 7.5000 mg | ORAL_TABLET | Freq: Two times a day (BID) | ORAL | Status: DC

## 2012-07-04 NOTE — Patient Instructions (Addendum)
Annual wellness in September.  Fasting lipid, cmp and EGFR, hBA1C before visit.  Microalb today.  New medication for anxiety, take twice daily  You are referred for chest CT  Please think about quitting smoking.  This is very important for your health.  Consider setting a quit date, then cutting back or switching brands to prepare to stop.  Also think of the money you will save every day by not smoking.  Quick Tips to Quit Smoking: Fix a date i.e. keep a date in mind from when you would not touch a tobacco product to smoke  Keep yourself busy and block your mind with work loads or reading books or watching movies in malls where smoking is not allowed  Vanish off the things which reminds you about smoking for example match box, or your favorite lighter, or the pipe you used for smoking, or your favorite jeans and shirt with which you used to enjoy smoking, or the club where you used to do smoking  Try to avoid certain people places and incidences where and with whom smoking is a common factor to add on  Praise yourself with some token gifts from the money you saved by stopping smoking  Anti Smoking teams are there to help you. Join their programs  Anti-smoking Gums are there in many medical shops. Try them to quit smoking   Side-effects of Smoking: Disease caused by smoking cigarettes are emphysema, bronchitis, heart failures  Premature death  Cancer is the major side effect of smoking  Heart attacks and strokes are the quick effects of smoking causing sudden death  Some smokers lives end up with limbs amputated  Breathing problem or fast breathing is another side effect of smoking  Due to more intakes of smokes, carbon mono-oxide goes into your brain and other muscles of the body which leads to swelling of the veins and blockage to the air passage to lungs  Carbon monoxide blocks blood vessels which leads to blockage in the flow of blood to different major body organs like heart lungs  and thus leads to attacks and deaths  During pregnancy smoking is very harmful and leads to premature birth of the infant, spontaneous abortions, low weight of the infant during birth  Fat depositions to narrow and blocked blood vessels causing heart attacks  In many cases cigarette smoking caused infertility in men

## 2012-07-05 LAB — MICROALBUMIN / CREATININE URINE RATIO
Creatinine, Urine: 92.3 mg/dL
Microalb Creat Ratio: 9.3 mg/g (ref 0.0–30.0)
Microalb, Ur: 0.86 mg/dL (ref 0.00–1.89)

## 2012-07-06 NOTE — Assessment & Plan Note (Signed)
Unchanged, cessation counseling done for 3 minutes, unwilling to quit

## 2012-07-06 NOTE — Assessment & Plan Note (Signed)
Stale , no hallucinations

## 2012-07-06 NOTE — Assessment & Plan Note (Signed)
Unchanged. Patient re-educated about  the importance of commitment to a  minimum of 150 minutes of exercise per week. The importance of healthy food choices with portion control discussed. Encouraged to start a food diary, count calories and to consider  joining a support group. Sample diet sheets offered. Goals set by the patient for the next several months.    

## 2012-07-06 NOTE — Assessment & Plan Note (Signed)
Uncontrolled, add buspar 

## 2012-07-06 NOTE — Assessment & Plan Note (Signed)
Controlled, no change in medication  

## 2012-07-06 NOTE — Progress Notes (Signed)
  Subjective:    Patient ID: Matthew Molina, male    DOB: 1951-05-02, 61 y.o.   MRN: [id number]  HPI The PT is here for follow up and re-evaluation of chronic medical conditions, medication management and review of any available recent lab and radiology data.  Preventive health is updated, specifically  Cancer screening and Immunization.   Questions or concerns regarding consultations or procedures which the PT has had in the interim are  addressed. The PT denies any adverse reactions to current medications since the last visit.  C/o testicular pain intermittently, denies penile discharge or dysuria. Denies swollen glands, no fever  Continues to smoke 1 ppd , hopes to quit some time, but unable to commit to a date Denies polyuria, polydipsia or blurred cision, fasting sugars ar seldom over 110 C/o increased anxiety, requests medication for this     Review of Systems See HPI Denies recent fever or chills. Denies sinus pressure, nasal congestion, ear pain or sore throat. Denies chest congestion, productive cough or wheezing. Denies chest pains, palpitations and leg swelling Denies abdominal pain, nausea, vomiting,diarrhea or constipation.   Denies dysuria, frequency, hesitancy or incontinence. Chronic  joint pain, denies  limitation in mobility. Denies headaches, seizures, numbness, or tingling. Denies depression, or insomnia. Denies skin break down or rash.        Objective:   Physical Exam Patient alert and oriented and in no cardiopulmonary distress.  HEENT: No facial asymmetry, EOMI, no sinus tenderness,  oropharynx pink and moist.  Neck supple no adenopathy.  Chest: Clear to auscultation bilaterally.  CVS: S1, S2 no murmurs, no S3.  ABD: Soft non tender. Bowel sounds normal. Rectal: no mass, guaiac negative stool, prostate smooth, non tender, not  Genital: no testicular mass or tenderness, no penile d/c or ulcer, no inguinal adenopathy Ext: No edema  MS: Adequate ROM  spine, shoulders, hips and knees.  Skin: Intact, no ulcerations or rash noted.  Psych: Good eye contact,blunted  affect. Memory intact  Mildly  anxious not depressed appearing.  CNS: CN 2-12 intact, power, tone and sensation normal throughout.        Assessment & Plan:

## 2012-07-06 NOTE — Assessment & Plan Note (Signed)
Uncontrolled, low fat diet discussed and encouraged, esp reduction in butter  And cheese, no med change

## 2012-07-29 ENCOUNTER — Other Ambulatory Visit: Payer: Self-pay | Admitting: Family Medicine

## 2012-08-11 ENCOUNTER — Emergency Department (HOSPITAL_COMMUNITY): Payer: Medicare Other

## 2012-08-11 ENCOUNTER — Other Ambulatory Visit: Payer: Self-pay

## 2012-08-11 ENCOUNTER — Encounter (HOSPITAL_COMMUNITY): Payer: Self-pay | Admitting: Emergency Medicine

## 2012-08-11 ENCOUNTER — Emergency Department (HOSPITAL_COMMUNITY)
Admission: EM | Admit: 2012-08-11 | Discharge: 2012-08-11 | Disposition: A | Discharge status: Home / Self Care | Payer: Medicare Other | Attending: Emergency Medicine | Admitting: Emergency Medicine

## 2012-08-11 DIAGNOSIS — Z79899 Other long term (current) drug therapy: Secondary | ICD-10-CM | POA: Insufficient documentation

## 2012-08-11 DIAGNOSIS — I10 Essential (primary) hypertension: Secondary | ICD-10-CM | POA: Insufficient documentation

## 2012-08-11 DIAGNOSIS — E669 Obesity, unspecified: Secondary | ICD-10-CM | POA: Insufficient documentation

## 2012-08-11 DIAGNOSIS — E119 Type 2 diabetes mellitus without complications: Secondary | ICD-10-CM | POA: Insufficient documentation

## 2012-08-11 DIAGNOSIS — R079 Chest pain, unspecified: Secondary | ICD-10-CM | POA: Insufficient documentation

## 2012-08-11 DIAGNOSIS — E785 Hyperlipidemia, unspecified: Secondary | ICD-10-CM | POA: Insufficient documentation

## 2012-08-11 DIAGNOSIS — Z8659 Personal history of other mental and behavioral disorders: Secondary | ICD-10-CM | POA: Insufficient documentation

## 2012-08-11 DIAGNOSIS — F17200 Nicotine dependence, unspecified, uncomplicated: Secondary | ICD-10-CM | POA: Insufficient documentation

## 2012-08-11 LAB — CBC
HCT: 40.2 % (ref 39.0–52.0)
Hemoglobin: 13.6 g/dL (ref 13.0–17.0)
MCH: 30 pg (ref 26.0–34.0)
MCHC: 33.8 g/dL (ref 30.0–36.0)
MCV: 88.5 fL (ref 78.0–100.0)
Platelets: 208 10*3/uL (ref 150–400)
RBC: 4.54 MIL/uL (ref 4.22–5.81)
RDW: 14.3 % (ref 11.5–15.5)
WBC: 5.9 10*3/uL (ref 4.0–10.5)

## 2012-08-11 LAB — BASIC METABOLIC PANEL
BUN: 10 mg/dL (ref 6–23)
CO2: 26 mEq/L (ref 19–32)
Calcium: 9.9 mg/dL (ref 8.4–10.5)
Chloride: 99 mEq/L (ref 96–112)
Creatinine, Ser: 1.19 mg/dL (ref 0.50–1.35)
GFR calc Af Amer: 74 mL/min — ABNORMAL LOW (ref >90)
GFR calc non Af Amer: 64 mL/min — ABNORMAL LOW (ref >90)
Glucose, Bld: 84 mg/dL (ref 70–99)
Potassium: 3.5 mEq/L (ref 3.5–5.1)
Sodium: 135 mEq/L (ref 135–145)

## 2012-08-11 LAB — TROPONIN I: Troponin I: <0.3 ng/mL (ref <0.30)

## 2012-08-11 MED ORDER — ASPIRIN 81 MG PO CHEW
324.0000 mg | CHEWABLE_TABLET | Freq: Once | ORAL | Status: DC
  Filled 2012-08-11: qty 4

## 2012-08-11 NOTE — ED Provider Notes (Signed)
History   This chart was scribed for Matthew Razor, MD by Charolett Bumpers . The patient was seen in room APA05/APA05. Patient's care was started at 1045.    CSN: [id number]  Arrival date & time 08/11/12  1044   First MD Initiated Contact with Patient 08/11/12 1045      Chief Complaint  Patient presents with  . Chest Pain    (Consider location/radiation/quality/duration/timing/severity/associated sxs/prior treatment) HPI Matthew Molina is a 61 y.o. male who presents to the Emergency Department complaining of  moderate left upper chest pain with an onset of this morning. Pt states that he did not notice any pain around 7 am this morning when he woke up. Pt reports that he was sitting when the chest pain started but not sure what time this was. Pt describes the chest pain as achy and sharp that lasted for a couple minutes that resolved afterwards. Pt states that he has not had the pain since the episode. Pt denies any SOB, leg swelling, nausea and vomiting. Pt reports a h/o similar pain on the right side in which he was admitted for and had a stress test. Pt has a h/o diabetes and HTN. Pt denies any h/o DVT or cardiac catheterization. Pt reports that he takes aspirin daily and already had this am.   PCP: Dr. Lodema Hong  Past Medical History  Diagnosis Date  . Nicotine addiction   . Schizophrenia   . Hyperlipidemia   . Obesity   . Hypertension   . Diabetes mellitus     History reviewed. No pertinent past surgical history.  Family History  Problem Relation Age of Onset  . Diabetes Mother   . Hypertension Mother   . Stroke Mother     History  Substance Use Topics  . Smoking status: Current Everyday Smoker -- 1.0 packs/day    Types: Cigarettes  . Smokeless tobacco: Not on file  . Alcohol Use: No      Review of Systems  Constitutional: Positive for diaphoresis.  Respiratory: Negative for shortness of breath.   Cardiovascular: Positive for chest pain. Negative for leg  swelling.  Gastrointestinal: Negative for nausea and vomiting.  All other systems reviewed and are negative.    Allergies  Sertraline hcl  Home Medications   Current Outpatient Rx  Name Route Sig Dispense Refill  . AMLODIPINE BESYLATE 10 MG PO TABS Oral Take 1 tablet (10 mg total) by mouth daily. 30 tablet 6  . ASPIRIN 325 MG PO TABS Oral Take 325 mg by mouth daily.      . BUDESONIDE-FORMOTEROL FUMARATE 80-4.5 MCG/ACT IN AERO Inhalation Inhale 2 puffs into the lungs 2 (two) times daily. 1 Inhaler 12  . BUSPIRONE HCL 7.5 MG PO TABS Oral Take 1 tablet (7.5 mg total) by mouth 2 (two) times daily. 60 tablet 2  . CALCIUM CARBONATE-VITAMIN D 500-200 MG-UNIT PO TABS Oral Take 1 tablet by mouth 2 (two) times daily.      Marland Kitchen GLIPIZIDE ER 10 MG PO TB24 Oral Take 1 tablet (10 mg total) by mouth daily. 60 tablet 6  . METFORMIN HCL 1000 MG PO TABS  TAKE 1 TABLET BY MOUTH   TWICE DAILY WITH FOOD FORDIABETES. 60 tablet 6  . MOTRIN 800 MG PO TABS  TAKE (1) TABLET EVERY 8 HOURS AS NEEDED FOR PAIN. 30 each 1  . ADULT MULTIVITAMIN W/MINERALS CH Oral Take 1 tablet by mouth daily.    Marland Kitchen OXYMETAZOLINE HCL 0.05 % NA SOLN Nasal Place  2 sprays into the nose daily as needed. For nasal congestion    . PRAVASTATIN SODIUM 40 MG PO TABS  TAKE (1) TABLET BY MOUTH AT BEDTIME FORCHOLESTEROL. 30 tablet 6  . RISPERIDONE 4 MG PO TABS Oral Take 4 mg by mouth at bedtime.    Marland Kitchen TIOTROPIUM BROMIDE MONOHYDRATE 18 MCG IN CAPS Inhalation Place 1 capsule (18 mcg total) into inhaler and inhale daily. 30 capsule 2  . TRIAMTERENE-HCTZ 37.5-25 MG PO TABS Oral Take 1 each (1 tablet total) by mouth daily. 30 tablet 6  . VALSARTAN 320 MG PO TABS Oral Take 1 tablet (320 mg total) by mouth daily. 30 tablet 6  . BUTALBITAL-APAP-CAFF-COD 50-325-40-30 MG PO CAPS Oral Take 1 capsule by mouth. Take 1 tablet by mouth two times a day as needed, max twice weekly     . HYDROCODONE-ACETAMINOPHEN 5-500 MG PO TABS Oral Take 1 tablet by mouth 2 (two) times  daily. 20 tablet 0    BP 132/84  Pulse 77  Temp 98.6 F (37 C) (Oral)  Resp 14  Ht 6' 1.5" (1.867 m)  Wt 242 lb (109.77 kg)  BMI 31.49 kg/m2  SpO2 100%  Physical Exam  Nursing note and vitals reviewed. Constitutional: He appears well-developed and well-nourished. No distress.       Obese  HENT:  Head: Normocephalic and atraumatic.  Eyes: Conjunctivae are normal. Right eye exhibits no discharge. Left eye exhibits no discharge.  Neck: Neck supple.  Cardiovascular: Normal rate, regular rhythm and normal heart sounds.  Exam reveals no gallop and no friction rub.   No murmur heard. Pulmonary/Chest: Effort normal and breath sounds normal. No respiratory distress. He has no wheezes. He has no rales. He exhibits no tenderness.       Chest pain is not reproducible to palpation.   Abdominal: Soft. He exhibits no distension. There is no tenderness.  Musculoskeletal: He exhibits no edema and no tenderness.  Neurological: He is alert.  Skin: Skin is warm and dry.  Psychiatric: He has a normal mood and affect. His behavior is normal. Thought content normal.    ED Course  Procedures (including critical care time)  DIAGNOSTIC STUDIES: Oxygen Saturation is 100% on room air, normal by my interpretation.    COORDINATION OF CARE:  11:00-Ordered pt aspirin, but pt states that he took his daily aspirin this morning.   11:20-Discussed planned course of treatment with the patient including chest x-ray and blood work, who is agreeable at this time.   11:58-Recheck: Informed pt of normal imaging and lab results. Discussed f/u with Dr. Lodema Hong for stress test.   Results for orders placed during the hospital encounter of 08/11/12  CBC      Component Value Range   WBC 5.9  4.0 - 10.5 K/uL   RBC 4.54  4.22 - 5.81 MIL/uL   Hemoglobin 13.6  13.0 - 17.0 g/dL   HCT 47.8  29.5 - 62.1 %   MCV 88.5  78.0 - 100.0 fL   MCH 30.0  26.0 - 34.0 pg   MCHC 33.8  30.0 - 36.0 g/dL   RDW 30.8  65.7 - 84.6 %     Platelets 208  150 - 400 K/uL  TROPONIN I      Component Value Range   Troponin I <0.30  <0.30 ng/mL  BASIC METABOLIC PANEL      Component Value Range   Sodium 135  135 - 145 mEq/L   Potassium 3.5  3.5 - 5.1  mEq/L   Chloride 99  96 - 112 mEq/L   CO2 26  19 - 32 mEq/L   Glucose, Bld 84  70 - 99 mg/dL   BUN 10  6 - 23 mg/dL   Creatinine, Ser 8.29  0.50 - 1.35 mg/dL   Calcium 9.9  8.4 - 56.2 mg/dL   GFR calc non Af Amer 64 (*) >90 mL/min   GFR calc Af Amer 74 (*) >90 mL/min    Dg Chest 2 View  08/11/2012  *RADIOLOGY REPORT*  Clinical Data: Chest pain  CHEST - 2 VIEW  Comparison: 03/11/2012  Findings: Lungs are essentially clear. No pleural effusion or pneumothorax.  Heart is top normal in size.  Visualized osseous structures are within normal limits.  IMPRESSION: No evidence of acute cardiopulmonary disease.   Original Report Authenticated By: Charline Bills, M.D.     EKG:  Rhythm: Normal sinus rhythm Vent: rate 72 BPM PR interval: 188 ms QRS duration 88 ms QT/QTc 382/418 ms P-R-T axes 35-35 31 Axis: left Intervals: LAFB ST segments: normal Comparison: stable from previous from 03/11/12  1. Chest pain       MDM  61yM with CP. Somewhat atypical. EKG with no ischemic changes and similar to previous. Trop normal. HD stable. No respiratory distress. CXR clear. Doubt PE. Feel safe for DC at this time. Close outpt fu.   I personally preformed the services scribed in my presence. The recorded information has been reviewed and considered. Matthew Razor, MD.       Matthew Razor, MD 08/14/12 [id number]

## 2012-08-11 NOTE — ED Notes (Signed)
Pt not given aspirin because pt states he took a white aspirin before he left home.

## 2012-08-11 NOTE — ED Notes (Signed)
Pt c/o left intermittent upper chest pain. Pt has been coughing.

## 2012-08-21 ENCOUNTER — Other Ambulatory Visit: Payer: Self-pay | Admitting: Family Medicine

## 2012-09-02 ENCOUNTER — Encounter: Payer: Self-pay | Admitting: Family Medicine

## 2012-09-02 ENCOUNTER — Ambulatory Visit (INDEPENDENT_AMBULATORY_CARE_PROVIDER_SITE_OTHER): Payer: Medicare Other | Admitting: Family Medicine

## 2012-09-02 VITALS — BP 138/74 | HR 91 | Resp 18 | Ht 73.5 in | Wt 250.0 lb

## 2012-09-02 DIAGNOSIS — E1169 Type 2 diabetes mellitus with other specified complication: Secondary | ICD-10-CM

## 2012-09-02 DIAGNOSIS — M549 Dorsalgia, unspecified: Secondary | ICD-10-CM

## 2012-09-02 DIAGNOSIS — Z125 Encounter for screening for malignant neoplasm of prostate: Secondary | ICD-10-CM

## 2012-09-02 DIAGNOSIS — R51 Headache: Secondary | ICD-10-CM

## 2012-09-02 DIAGNOSIS — Z23 Encounter for immunization: Secondary | ICD-10-CM

## 2012-09-02 DIAGNOSIS — I10 Essential (primary) hypertension: Secondary | ICD-10-CM

## 2012-09-02 DIAGNOSIS — E669 Obesity, unspecified: Secondary | ICD-10-CM

## 2012-09-02 DIAGNOSIS — E119 Type 2 diabetes mellitus without complications: Secondary | ICD-10-CM

## 2012-09-02 DIAGNOSIS — E785 Hyperlipidemia, unspecified: Secondary | ICD-10-CM

## 2012-09-02 DIAGNOSIS — Z0000 Encounter for general adult medical examination without abnormal findings: Secondary | ICD-10-CM

## 2012-09-02 MED ORDER — KETOROLAC TROMETHAMINE 60 MG/2ML IJ SOLN
60.0000 mg | Freq: Once | INTRAMUSCULAR | Status: AC
  Administered 2012-09-02: 60 mg via INTRAMUSCULAR

## 2012-09-02 MED ORDER — METHYLPREDNISOLONE ACETATE 80 MG/ML IJ SUSP
80.0000 mg | Freq: Once | INTRAMUSCULAR | Status: AC
  Administered 2012-09-02: 80 mg via INTRAMUSCULAR

## 2012-09-02 NOTE — Patient Instructions (Addendum)
F/u end January  Toradol 60mg  Im and depo medrol 80 mg IM in office for back pain.  Flu vaccine today.   You are doing well and extremely functional based on wellness exam.  You need to cut back on cigarettes every month, as we discussed, espescialy since you want to LIVE  Fasting lipid, cmp and EGFR, HBA1c , PSA first week in DECEMBER PLEASE  Increase exercise to 5 days per week, and work on healthier eating so that you lose weight

## 2012-09-02 NOTE — Assessment & Plan Note (Signed)
Improved prob once per month

## 2012-09-02 NOTE — Progress Notes (Signed)
Subjective:    Patient ID: Matthew Molina, male    DOB: October 08, 1951, 61 y.o.   MRN: [id number] 1 month h/o increased low back pain radiaiting to left buttock andd post thigh to back of knee requests injectionHPI Preventive Screening-Counseling & Management   Patient present here today for a Medicare annual wellness visit.   Current Problems (verified)   Medications Prior to Visit Allergies (verified)   PAST HISTORY  Family History. Five living siblings, one brother died in his 67's , he was in a nursing home at the time.No known family h/o heart disease, stroke or canxcer  Social History Divorced, father of 2 adult daughters, ages 18 and 60. Current nicotine 18 per day, weaning slowly, no alcohol or illicit drugs. Disabled in 1986 on mental health grouinds   Risk Factors  Current exercise habits:  attempts to walk 3 days per week for approx 30 minutes will increase to 5 days  Dietary issues discussed:Increase in vegetable and reduction in carbs and sweets discussed   Cardiac risk factors: Tobacco, DM, hyperlipidemia  Depression Screen  (Note: if answer to either of the following is "Yes", a more complete depression screening is indicated)   Over the past two weeks, have you felt down, depressed or hopeless? No  Over the past two weeks, have you felt little interest or pleasure in doing things? No  Have you lost interest or pleasure in daily life? No  Do you often feel hopeless? No  Do you cry easily over simple problems? No   Activities of Daily Living  In your present state of health, do you have any difficulty performing the following activities?  Driving?: No Managing money?: No Feeding yourself?:No Getting from bed to chair?:No Climbing a flight of stairs?:sometimes, ambulates with assisitance of a cane from time to time since 2013 Preparing food and eating?:No Bathing or showering?:No Getting dressed?:No Getting to the toilet?:No Using the toilet?:No Moving  around from place to place?: No  Fall Risk Assessment In the past year have you fallen or had a near fall?:No Are you currently taking any medications that make you dizziness?:No   Hearing Difficulties: No Do you often ask people to speak up or repeat themselves?:No Do you experience ringing or noises in your ears?:No Do you have difficulty understanding soft or whispered voices?:No  Cognitive Testing  Alert? Yes Normal Appearance?Yes  Oriented to person? Yes Place? Yes  Time? Yes  Displays appropriate judgment?Yes  Can read the correct time from a watch face? yes Are you having problems remembering things?No  Advanced Directives have been discussed with the patient?Yes , pt is a full code and will defer withdrawal of services to health care providers in the event of brain death   List the Names of Other Physician/Practitioners you currently use: [facility], Dr Kelvin Cellar exam in [place]   Indicate any recent Medical Services you may have received from other than [facility] providers in the past year (date may be approximate).   Assessment:    Annual Wellness Exam   Plan:    During the course of the visit the patient was educated and counseled about appropriate screening and preventive services including:  A healthy diet is rich in fruit, vegetables and whole grains. Poultry fish, nuts and beans are a healthy choice for protein rather then red meat. A low sodium diet and drinking 64 ounces of water daily is generally recommended. Oils and sweet should be limited. Carbohydrates especially for those who are diabetic  or overweight, should be limited to 30-45 gram per meal. It is important to eat on a regular schedule, at least 3 times daily. Snacks should be primarily fruits, vegetables or nuts. It is important that you exercise regularly at least 30 minutes 5 times a week. If you develop chest pain, have severe difficulty breathing, or feel very tired, stop exercising immediately  and seek medical attention  Immunization reviewed and updated. Cancer screening reviewed and updated    Patient Instructions (the written plan) was given to the patient.  Medicare Attestation  I have personally reviewed:  The patient's medical and social history  Their use of alcohol, tobacco or illicit drugs  Their current medications and supplements  The patient's functional ability including ADLs,fall risks, home safety risks, cognitive, and hearing and visual impairment  Diet and physical activities  Evidence for depression or mood disorders  The patient's weight, height, BMI, and visual acuity have been recorded in the chart. I have made referrals, counseling, and provided education to the patient based on review of the above and I have provided the patient with a written personalized care plan for preventive services.     Review of Systems     Objective:   Physical Exam        Assessment & Plan:

## 2012-09-08 DIAGNOSIS — Z0000 Encounter for general adult medical examination without abnormal findings: Secondary | ICD-10-CM | POA: Insufficient documentation

## 2012-09-08 NOTE — Assessment & Plan Note (Signed)
Annual wellness exam completed at visit and documented in record. The importance of regular physical activity, following a healthy diet, low in sodium, fats and carbs is stressed. Nicotine cessation counseling done, trying to cut back Importance of regular eye exams discussed, finances remain a barrier

## 2012-09-08 NOTE — Assessment & Plan Note (Signed)
Controlled, no change in medication  

## 2012-09-08 NOTE — Assessment & Plan Note (Signed)
Increased and uncontrolled pain x 1 weeks, anti inflammatories in office

## 2012-09-19 ENCOUNTER — Other Ambulatory Visit: Payer: Self-pay | Admitting: Family Medicine

## 2012-10-03 ENCOUNTER — Other Ambulatory Visit: Payer: Self-pay | Admitting: Family Medicine

## 2012-10-24 ENCOUNTER — Other Ambulatory Visit: Payer: Self-pay | Admitting: Family Medicine

## 2012-11-25 ENCOUNTER — Other Ambulatory Visit: Payer: Self-pay

## 2012-11-25 DIAGNOSIS — F411 Generalized anxiety disorder: Secondary | ICD-10-CM

## 2012-11-25 MED ORDER — BUSPIRONE HCL 7.5 MG PO TABS: 7.5000 mg | ORAL_TABLET | Freq: Two times a day (BID) | ORAL | Status: DC

## 2012-12-05 ENCOUNTER — Other Ambulatory Visit: Payer: Self-pay | Admitting: Family Medicine

## 2012-12-06 ENCOUNTER — Telehealth: Payer: Self-pay | Admitting: Family Medicine

## 2012-12-06 MED ORDER — IBUPROFEN 800 MG PO TABS: 800.0000 mg | ORAL_TABLET | Freq: Three times a day (TID) | ORAL | Status: DC | PRN

## 2012-12-06 NOTE — Telephone Encounter (Signed)
Med was sent in in Nov with refills. Resent again today

## 2012-12-18 HISTORY — PX: COLONOSCOPY: SHX174

## 2013-01-14 ENCOUNTER — Ambulatory Visit: Payer: Medicare Other | Admitting: Family Medicine

## 2013-02-19 ENCOUNTER — Other Ambulatory Visit: Payer: Self-pay

## 2013-02-19 MED ORDER — GLIPIZIDE ER 10 MG PO TB24: ORAL_TABLET | ORAL | Status: DC

## 2013-02-19 MED ORDER — IBUPROFEN 800 MG PO TABS: 800.0000 mg | ORAL_TABLET | Freq: Three times a day (TID) | ORAL | Status: DC | PRN

## 2013-03-13 ENCOUNTER — Ambulatory Visit (INDEPENDENT_AMBULATORY_CARE_PROVIDER_SITE_OTHER): Payer: Medicare Other | Admitting: Family Medicine

## 2013-03-13 ENCOUNTER — Encounter: Payer: Self-pay | Admitting: Family Medicine

## 2013-03-13 VITALS — BP 114/78 | HR 97 | Resp 16 | Ht 73.5 in | Wt 252.0 lb

## 2013-03-13 DIAGNOSIS — F209 Schizophrenia, unspecified: Secondary | ICD-10-CM

## 2013-03-13 DIAGNOSIS — I10 Essential (primary) hypertension: Secondary | ICD-10-CM

## 2013-03-13 DIAGNOSIS — E1169 Type 2 diabetes mellitus with other specified complication: Secondary | ICD-10-CM

## 2013-03-13 DIAGNOSIS — E669 Obesity, unspecified: Secondary | ICD-10-CM

## 2013-03-13 DIAGNOSIS — B351 Tinea unguium: Secondary | ICD-10-CM

## 2013-03-13 DIAGNOSIS — E785 Hyperlipidemia, unspecified: Secondary | ICD-10-CM

## 2013-03-13 DIAGNOSIS — J449 Chronic obstructive pulmonary disease, unspecified: Secondary | ICD-10-CM

## 2013-03-13 DIAGNOSIS — R5381 Other malaise: Secondary | ICD-10-CM

## 2013-03-13 DIAGNOSIS — R5383 Other fatigue: Secondary | ICD-10-CM

## 2013-03-13 DIAGNOSIS — J4489 Other specified chronic obstructive pulmonary disease: Secondary | ICD-10-CM

## 2013-03-13 DIAGNOSIS — E119 Type 2 diabetes mellitus without complications: Secondary | ICD-10-CM

## 2013-03-13 DIAGNOSIS — F17200 Nicotine dependence, unspecified, uncomplicated: Secondary | ICD-10-CM

## 2013-03-13 DIAGNOSIS — Z1211 Encounter for screening for malignant neoplasm of colon: Secondary | ICD-10-CM

## 2013-03-13 LAB — HEMOCCULT GUIAC POC 1CARD (OFFICE): Fecal Occult Blood, POC: NEGATIVE

## 2013-03-13 MED ORDER — TERBINAFINE HCL 250 MG PO TABS: 250.0000 mg | ORAL_TABLET | Freq: Every day | ORAL | Status: DC

## 2013-03-13 NOTE — Patient Instructions (Addendum)
F/u in 4 month  hBA1C , cmp and EGFr today,.   New medication daily for fungal toenail and foot infection  You are referred for colonoscopy  Script will be sent to [facility] for diabetic shoes   You will need fasting labs, pls call back 2 weeks before appt for the sheet

## 2013-03-14 ENCOUNTER — Other Ambulatory Visit: Payer: Self-pay | Admitting: Family Medicine

## 2013-03-14 LAB — COMPLETE METABOLIC PANEL WITH GFR
ALT: 25 U/L (ref 0–53)
AST: 18 U/L (ref 0–37)
Albumin: 4.6 g/dL (ref 3.5–5.2)
Alkaline Phosphatase: 52 U/L (ref 39–117)
BUN: 12 mg/dL (ref 6–23)
CO2: 25 mEq/L (ref 19–32)
Calcium: 10.2 mg/dL (ref 8.4–10.5)
Chloride: 100 mEq/L (ref 96–112)
Creat: 1.21 mg/dL (ref 0.50–1.35)
GFR, Est African American: 73 mL/min
GFR, Est Non African American: 63 mL/min
Glucose, Bld: 75 mg/dL (ref 70–99)
Potassium: 4.1 mEq/L (ref 3.5–5.3)
Sodium: 132 mEq/L — ABNORMAL LOW (ref 135–145)
Total Bilirubin: 0.5 mg/dL (ref 0.3–1.2)
Total Protein: 7.8 g/dL (ref 6.0–8.3)

## 2013-03-14 LAB — HEMOGLOBIN A1C
Hgb A1c MFr Bld: 6.1 % — ABNORMAL HIGH (ref <5.7)
Mean Plasma Glucose: 128 mg/dL — ABNORMAL HIGH (ref <117)

## 2013-03-16 NOTE — Assessment & Plan Note (Signed)
Deteriorated. Patient re-educated about  the importance of commitment to a  minimum of 150 minutes of exercise per week. The importance of healthy food choices with portion control discussed. Encouraged to start a food diary, count calories and to consider  joining a support group. Sample diet sheets offered. Goals set by the patient for the next several months.    

## 2013-03-16 NOTE — Progress Notes (Signed)
  Subjective:    Patient ID: Matthew Molina, male    DOB: 1951-03-29, 62 y.o.   MRN: [id number]  HPI The PT is here for follow up and re-evaluation of chronic medical conditions, medication management and review of any available recent lab and radiology data.  Preventive health is updated, specifically  Cancer screening and Immunization.   Questions or concerns regarding consultations or procedures which the PT has had in the interim are  addressed. The PT denies any adverse reactions to current medications since the last visit.  Intermittent rectal pain no h/o bloody stool Denies polyuria, polydipsia or blurred vision, blood sugars are within normal range when tested Smoking 1 PPd unwilling to quit at this time      Review of Systems See HPI Denies recent fever or chills. Denies sinus pressure, nasal congestion, ear pain or sore throat. Denies chest congestion, productive cough or wheezing. Denies chest pains, palpitations and leg swelling Denies abdominal pain, nausea, vomiting,diarrhea or constipation.   Denies dysuria, frequency, hesitancy or incontinence. Denies joint pain, swelling and limitation in mobility. Denies headaches, seizures, numbness, or tingling. Denies depression, anxiety or insomnia. Denies skin break down or rash.        Objective:   Physical Exam Patient alert and oriented and in no cardiopulmonary distress.  HEENT: No facial asymmetry, EOMI, no sinus tenderness,  oropharynx pink and moist.  Neck supple no adenopathy.  Chest: Clear to auscultation bilaterally.  CVS: S1, S2 no murmurs, no S3.  ABD: Soft non tender. Bowel sounds normal. Rectal: no mass, heme negative stool.Prostate not enlarged, no nodules, smooth and firm Ext: No edema  MS: Adequate ROM spine, shoulders, hips and knees.  Skin: Intact, no ulcerations or rash noted.  Psych: Good eye contact, blunted  affect. Memory impaired not anxious or depressed appearing.  CNS: CN 2-12  intact, power, normal throughout.        Assessment & Plan:

## 2013-03-16 NOTE — Assessment & Plan Note (Signed)
Controlled, no change in medication Patient advised to reduce carb and sweets, commit to regular physical activity, take meds as prescribed, test blood as directed, and attempt to lose weight, to improve blood sugar control.  

## 2013-03-16 NOTE — Assessment & Plan Note (Signed)
Controlled, no change in medication  

## 2013-03-16 NOTE — Assessment & Plan Note (Signed)
Worsening wih ongoing nicotine use Cessation counseling done

## 2013-03-16 NOTE — Assessment & Plan Note (Signed)
Severe bilateral onychomycosis and tinea pedis, oral med x 3 month

## 2013-03-16 NOTE — Assessment & Plan Note (Signed)
Controlled, no change in medication DASH diet and commitment to daily physical activity for a minimum of 30 minutes discussed and encouraged, as a part of hypertension management. The importance of attaining a healthy weight is also discussed.  

## 2013-03-16 NOTE — Assessment & Plan Note (Signed)
Hyperlipidemia:Low fat diet discussed and encouraged.  Updated lab for next visit 

## 2013-03-16 NOTE — Assessment & Plan Note (Signed)
Unchanged, current is 1 PPD Patient counseled for approximately 5 minutes regarding the health risks of ongoing nicotine use, specifically all types of cancer, heart disease, stroke and respiratory failure. The options available for help with cessation ,the behavioral changes to assist the process, and the option to either gradully reduce usage  Or abruptly stop.is also discussed. Pt is also encouraged to set specific goals in number of cigarettes used daily, as well as to set a quit date.

## 2013-03-19 ENCOUNTER — Telehealth: Payer: Self-pay

## 2013-03-19 ENCOUNTER — Other Ambulatory Visit: Payer: Self-pay

## 2013-03-19 DIAGNOSIS — Z1211 Encounter for screening for malignant neoplasm of colon: Secondary | ICD-10-CM

## 2013-03-20 ENCOUNTER — Encounter (HOSPITAL_COMMUNITY): Payer: Self-pay | Admitting: Pharmacy Technician

## 2013-03-21 ENCOUNTER — Other Ambulatory Visit: Payer: Self-pay | Admitting: Family Medicine

## 2013-03-21 NOTE — Telephone Encounter (Signed)
MOVI PREP SPLIT DOSING, REGULAR BREAKFAST. CLEAR LIQUIDS AFTER 9 AM.  CONTINUE GLUCOPHAGE. TAKE HALF glipizide ON DAY BEFORE TCS. HOLD GLIPIZIDE ON DAY OF TCS.

## 2013-03-21 NOTE — Telephone Encounter (Signed)
Gastroenterology Pre-Procedure Form    Request Date: 04/01/2013    Requesting Physician: Dr. Lodema Hong     PATIENT INFORMATION:  Matthew Molina is a 62 y.o., male (DOB=22-Oct-1951).  PROCEDURE: Procedure(s) requested: colonoscopy Procedure Reason: screening for colon cancer  PATIENT REVIEW QUESTIONS: The patient reports the following:   1. Diabetes Melitis: YES 2. Joint replacements in the past 12 months: no 3. Major health problems in the past 3 months: no 4. Has an artificial valve or MVP:no 5. Has been advised in past to take antibiotics in advance of a procedure like teeth cleaning: no}    MEDICATIONS & ALLERGIES:    Patient reports the following regarding taking any blood thinners:   Plavix? no Aspirin? YES Coumadin?  no  Patient confirms/reports the following medications:  Current Outpatient Prescriptions  Medication Sig Dispense Refill  . amLODipine (NORVASC) 10 MG tablet Take 1 tablet (10 mg total) by mouth daily.  30 tablet  6  . aspirin 81 MG tablet Take 81 mg by mouth daily.      . budesonide-formoterol (SYMBICORT) 80-4.5 MCG/ACT inhaler Inhale 2 puffs into the lungs 2 (two) times daily.  1 Inhaler  12  . busPIRone (BUSPAR) 7.5 MG tablet Take 1 tablet (7.5 mg total) by mouth 2 (two) times daily.  60 tablet  2  . glipiZIDE (GLIPIZIDE XL) 10 MG 24 hr tablet TAKE 1 TABLET BY MOUTH DAILY.  60 tablet  3  . ibuprofen (ADVIL,MOTRIN) 800 MG tablet TAKE (1) TABLET EVERY 8 HOURS AS NEEDED FOR PAIN.  30 tablet  3  . metFORMIN (GLUCOPHAGE) 1000 MG tablet TAKE 1 TABLET BY MOUTH   TWICE DAILY WITH FOOD FORDIABETES.  60 tablet  6  . oxymetazoline (NASAL SPRAY 12 HOUR) 0.05 % nasal spray Place 2 sprays into the nose daily as needed. For nasal congestion      . PRAVACHOL 40 MG tablet TAKE (1) TABLET BY MOUTH AT BEDTIME FOR CHOLESTEROL.  30 tablet  3  . RISPERDAL 4 MG tablet TAKE (1) TABLET BY MOUTH AT BEDTIME.  30 tablet  3  . SYMBICORT 80-4.5 MCG/ACT inhaler Inhale 2 puffs into the  lungs 4 (four) times daily.      Marland Kitchen terbinafine (LAMISIL) 250 MG tablet Take 1 tablet (250 mg total) by mouth daily.  42 tablet  1  . triamterene-hydrochlorothiazide (MAXZIDE-25) 37.5-25 MG per tablet Take 1 each (1 tablet total) by mouth daily.  30 tablet  6  . valsartan (DIOVAN) 320 MG tablet Take 1 tablet (320 mg total) by mouth daily.  30 tablet  6  . calcium-vitamin D (OSCAL WITH D) 500-200 MG-UNIT per tablet Take 1 tablet by mouth 2 (two) times daily.        . Multiple Vitamin (MULTIVITAMIN WITH MINERALS) TABS Take 1 tablet by mouth daily.      Marland Kitchen SPIRIVA HANDIHALER 18 MCG inhalation capsule Place 1 capsule into inhaler and inhale daily.      Marland Kitchen tiotropium (SPIRIVA HANDIHALER) 18 MCG inhalation capsule Place 1 capsule (18 mcg total) into inhaler and inhale daily.  30 capsule  2  . [DISCONTINUED] sildenafil (VIAGRA) 100 MG tablet Take 100 mg by mouth. Take one tablet 30 mins before intercourse        Current Facility-Administered Medications  Medication Dose Route Frequency Provider Last Rate Last Dose  . Influenza (>/= 3 years) inactive virus vaccine (FLVIRIN/FLUZONE) injection SUSP 0.5 mL  0.5 mL Intramuscular Once Kerri Perches, MD  Patient confirms/reports the following allergies:  Allergies  Allergen Reactions  . Sertraline Hcl     Stomach upset/pain    Patient is appropriate to schedule for requested procedure(s): yes  AUTHORIZATION INFORMATION Primary Insurance:   ID #:   Group #:  Pre-Cert / Auth required: Pre-Cert / Auth #:   Secondary Insurance:   ID #:   Group #:  Pre-Cert / Auth required Pre-Cert / Auth #:   No orders of the defined types were placed in this encounter.    SCHEDULE INFORMATION: Procedure has been scheduled as follows:  Date: 04/01/2013    Time: 10:00 AM  Location: [facility] Short Stay  This Gastroenterology Pre-Precedure Form is being routed to the following provider(s) for review: Jonette Eva, MD

## 2013-03-24 MED ORDER — PEG-KCL-NACL-NASULF-NA ASC-C 100 G PO SOLR: 1.0000 | ORAL | Status: DC

## 2013-03-24 NOTE — Telephone Encounter (Signed)
Rx sent to [facility] and instructions mailed to pt.

## 2013-03-28 ENCOUNTER — Telehealth: Payer: Self-pay

## 2013-03-28 NOTE — Telephone Encounter (Signed)
I talked with Matthew Molina and she said that no PA was needed

## 2013-03-31 MED ORDER — SODIUM CHLORIDE 0.9 % IV SOLN
INTRAVENOUS | Status: DC
  Administered 2013-04-01: 08:00:00 via INTRAVENOUS

## 2013-04-01 ENCOUNTER — Encounter (HOSPITAL_COMMUNITY)
Admission: RE | Disposition: A | Discharge status: Home / Self Care | Payer: Self-pay | Source: Ambulatory Visit | Attending: Gastroenterology

## 2013-04-01 ENCOUNTER — Ambulatory Visit (HOSPITAL_COMMUNITY)
Admission: RE | Admit: 2013-04-01 | Discharge: 2013-04-01 | Disposition: A | Discharge status: Home / Self Care | Payer: Medicare Other | Source: Ambulatory Visit | Attending: Gastroenterology | Admitting: Gastroenterology

## 2013-04-01 ENCOUNTER — Other Ambulatory Visit: Payer: Self-pay

## 2013-04-01 ENCOUNTER — Encounter (HOSPITAL_COMMUNITY): Payer: Self-pay | Admitting: *Deleted

## 2013-04-01 ENCOUNTER — Telehealth: Payer: Self-pay

## 2013-04-01 DIAGNOSIS — K648 Other hemorrhoids: Secondary | ICD-10-CM | POA: Insufficient documentation

## 2013-04-01 DIAGNOSIS — I10 Essential (primary) hypertension: Secondary | ICD-10-CM | POA: Insufficient documentation

## 2013-04-01 DIAGNOSIS — Z7982 Long term (current) use of aspirin: Secondary | ICD-10-CM | POA: Insufficient documentation

## 2013-04-01 DIAGNOSIS — E785 Hyperlipidemia, unspecified: Secondary | ICD-10-CM | POA: Insufficient documentation

## 2013-04-01 DIAGNOSIS — E669 Obesity, unspecified: Secondary | ICD-10-CM | POA: Insufficient documentation

## 2013-04-01 DIAGNOSIS — E119 Type 2 diabetes mellitus without complications: Secondary | ICD-10-CM | POA: Insufficient documentation

## 2013-04-01 DIAGNOSIS — IMO0002 Reserved for concepts with insufficient information to code with codable children: Secondary | ICD-10-CM | POA: Insufficient documentation

## 2013-04-01 DIAGNOSIS — K5730 Diverticulosis of large intestine without perforation or abscess without bleeding: Secondary | ICD-10-CM | POA: Insufficient documentation

## 2013-04-01 DIAGNOSIS — Z888 Allergy status to other drugs, medicaments and biological substances status: Secondary | ICD-10-CM | POA: Insufficient documentation

## 2013-04-01 DIAGNOSIS — Z1211 Encounter for screening for malignant neoplasm of colon: Secondary | ICD-10-CM | POA: Insufficient documentation

## 2013-04-01 DIAGNOSIS — Z79899 Other long term (current) drug therapy: Secondary | ICD-10-CM | POA: Insufficient documentation

## 2013-04-01 DIAGNOSIS — F209 Schizophrenia, unspecified: Secondary | ICD-10-CM | POA: Insufficient documentation

## 2013-04-01 DIAGNOSIS — F17200 Nicotine dependence, unspecified, uncomplicated: Secondary | ICD-10-CM | POA: Insufficient documentation

## 2013-04-01 DIAGNOSIS — Q438 Other specified congenital malformations of intestine: Secondary | ICD-10-CM | POA: Insufficient documentation

## 2013-04-01 HISTORY — PX: COLONOSCOPY: SHX5424

## 2013-04-01 LAB — GLUCOSE, CAPILLARY: Glucose-Capillary: 101 mg/dL — ABNORMAL HIGH (ref 70–99)

## 2013-04-01 SURGERY — COLONOSCOPY
Anesthesia: Moderate Sedation

## 2013-04-01 MED ORDER — MEPERIDINE HCL 100 MG/ML IJ SOLN
INTRAMUSCULAR | Status: DC | PRN
  Administered 2013-04-01: 25 mg via INTRAVENOUS
  Administered 2013-04-01: 50 mg via INTRAVENOUS
  Administered 2013-04-01: 25 mg via INTRAVENOUS

## 2013-04-01 MED ORDER — MIDAZOLAM HCL 5 MG/5ML IJ SOLN
INTRAMUSCULAR | Status: DC | PRN
  Administered 2013-04-01 (×2): 2 mg via INTRAVENOUS
  Administered 2013-04-01: 1 mg via INTRAVENOUS

## 2013-04-01 MED ORDER — MEPERIDINE HCL 100 MG/ML IJ SOLN
INTRAMUSCULAR | Status: AC
  Filled 2013-04-01: qty 2

## 2013-04-01 MED ORDER — STERILE WATER FOR IRRIGATION IR SOLN
Status: DC | PRN
  Administered 2013-04-01: 09:00:00

## 2013-04-01 MED ORDER — MIDAZOLAM HCL 5 MG/5ML IJ SOLN
INTRAMUSCULAR | Status: AC
  Filled 2013-04-01: qty 10

## 2013-04-01 MED ORDER — PEG-KCL-NACL-NASULF-NA ASC-C 100 G PO SOLR: 1.0000 | ORAL | Status: DC

## 2013-04-01 NOTE — Telephone Encounter (Signed)
REVIEWED. AGREE. 

## 2013-04-01 NOTE — Telephone Encounter (Signed)
Pt came by the office to reschedule his screening colonoscopy. He is scheduled for 04/08/2013 at 1:45 pm . New instructions given and new prep sent to [facility].

## 2013-04-01 NOTE — H&P (Signed)
Primary Care Physician:  Syliva Overman, MD Primary Gastroenterologist:  Dr. Darrick Penna  Pre-Procedure History & Physical: HPI:  Matthew Molina is a 62 y.o. male here for COLON CANCER SCREENING.  Past Medical History  Diagnosis Date  . Nicotine addiction   . Schizophrenia   . Hyperlipidemia   . Obesity   . Hypertension   . Diabetes mellitus     Past Surgical History  Procedure Laterality Date  . Colonoscopy      Prior to Admission medications   Medication Sig Start Date End Date Taking? Authorizing Provider  amLODipine (NORVASC) 10 MG tablet Take 1 tablet (10 mg total) by mouth daily. 07/04/12  Yes Kerri Perches, MD  aspirin 81 MG tablet Take 81 mg by mouth daily.   Yes Historical Provider, MD  budesonide-formoterol (SYMBICORT) 80-4.5 MCG/ACT inhaler Inhale 2 puffs into the lungs 2 (two) times daily. 02/26/12  Yes Kerri Perches, MD  busPIRone (BUSPAR) 7.5 MG tablet Take 1 tablet (7.5 mg total) by mouth 2 (two) times daily. 11/25/12 11/25/13 Yes Kerri Perches, MD  calcium-vitamin D (OSCAL WITH D) 500-200 MG-UNIT per tablet Take 1 tablet by mouth 2 (two) times daily.     Yes Historical Provider, MD  DIOVAN 320 MG tablet TAKE ONE TABLET BY MOUTH DAILY. 03/21/13  Yes Kerri Perches, MD  glipiZIDE (GLIPIZIDE XL) 10 MG 24 hr tablet TAKE 1 TABLET BY MOUTH DAILY. 02/19/13  Yes Kerri Perches, MD  ibuprofen (ADVIL,MOTRIN) 800 MG tablet TAKE (1) TABLET EVERY 8 HOURS AS NEEDED FOR PAIN. 03/14/13  Yes Kerri Perches, MD  metFORMIN (GLUCOPHAGE) 1000 MG tablet TAKE 1 TABLET BY MOUTH   TWICE DAILY WITH FOOD FORDIABETES. 07/04/12  Yes Kerri Perches, MD  Multiple Vitamin (MULTIVITAMIN WITH MINERALS) TABS Take 1 tablet by mouth daily.   Yes Historical Provider, MD  oxymetazoline (NASAL SPRAY 12 HOUR) 0.05 % nasal spray Place 2 sprays into the nose daily as needed. For nasal congestion   Yes Historical Provider, MD  peg 3350 powder (MOVIPREP) 100 G SOLR Take 1 kit (100 g total)  by mouth as directed. 03/24/13  Yes West Bali, MD  pravastatin (PRAVACHOL) 40 MG tablet TAKE (1) TABLET BY MOUTH AT BEDTIME FOR CHOLESTEROL. 03/21/13  Yes Kerri Perches, MD  risperidone (RISPERDAL) 4 MG tablet TAKE (1) TABLET BY MOUTH AT BEDTIME. 03/21/13  Yes Kerri Perches, MD  SPIRIVA HANDIHALER 18 MCG inhalation capsule Place 1 capsule into inhaler and inhale daily. 12/20/12  Yes Historical Provider, MD  SYMBICORT 80-4.5 MCG/ACT inhaler Inhale 2 puffs into the lungs 4 (four) times daily. 02/18/13  Yes Historical Provider, MD  terbinafine (LAMISIL) 250 MG tablet Take 1 tablet (250 mg total) by mouth daily. 03/13/13 07/13/13 Yes Kerri Perches, MD  tiotropium (SPIRIVA HANDIHALER) 18 MCG inhalation capsule Place 1 capsule (18 mcg total) into inhaler and inhale daily. 02/26/12  Yes Kerri Perches, MD  triamterene-hydrochlorothiazide (MAXZIDE-25) 37.5-25 MG per tablet TAKE (1) TABLET BY MOUTH ONCE DAILY. 03/21/13  Yes Kerri Perches, MD    Allergies as of 03/19/2013 - Review Complete 03/19/2013  Allergen Reaction Noted  . Sertraline hcl  04/17/2011    Family History  Problem Relation Age of Onset  . Diabetes Mother   . Hypertension Mother   . Stroke Mother     History   Social History  . Marital Status: Widowed    Spouse Name: N/A    Number of Children: 2  .  Years of Education: N/A   Occupational History  . retired from [employer]     Social History Main Topics  . Smoking status: Current Every Day Smoker -- 1.00 packs/day    Types: Cigarettes  . Smokeless tobacco: Not on file  . Alcohol Use: No  . Drug Use: No  . Sexually Active: Not on file   Other Topics Concern  . Not on file   Social History Narrative  . No narrative on file    Review of Systems: See HPI, otherwise negative ROS   Physical Exam: BP 114/64  Pulse 85  Temp(Src) 97.9 F (36.6 C) (Oral)  Resp 18  SpO2 98% General:   Alert,  pleasant and cooperative in NAD Head:  Normocephalic and  atraumatic. Neck:  Supple; Lungs:  Clear throughout to auscultation.    Heart:  Regular rate and rhythm. Abdomen:  Soft, nontender and nondistended. Normal bowel sounds, without guarding, and without rebound.   Neurologic:  Alert and  oriented x4;  grossly normal neurologically.  Impression/Plan:     SCREENING  Plan:  1. TCS TODAY

## 2013-04-01 NOTE — Telephone Encounter (Signed)
Pt had left [facility] yesterday and I was out sick. He had asked abut his regular meds before his procedure today. He had instructions for his BS meds. I told him he could take BP meds or anything for breathing.

## 2013-04-01 NOTE — Op Note (Signed)
[facility] [address] [place], [address]   COLONOSCOPY PROCEDURE REPORT  PATIENT: Matthew Molina, Matthew Molina  MR#: [id number] BIRTHDATE: 03-01-1951 , 62  yrs. old GENDER: Male ENDOSCOPIST: Jonette Eva, MD REFERRED JO:ACZYSAYT Lodema Hong, M.D. PROCEDURE DATE:  04/01/2013 PROCEDURE:   Colonoscopy, screening INDICATIONS:Average risk patient for colon cancer.  PT ATE BEANS AT 9 PM LAST NIGHT. MEDICATIONS: Demerol 100 mg IV and Versed 5 mg IV  DESCRIPTION OF PROCEDURE:    Physical exam was performed.  Informed consent was obtained from the patient after explaining the benefits, risks, and alternatives to procedure.  The patient was connected to monitor and placed in left lateral position. Continuous oxygen was provided by nasal cannula and IV medicine administered through an indwelling cannula.  After administration of sedation and rectal exam, the patients rectum was intubated and the EC-3890Li ([id number])  colonoscope was advanced under direct visualization to the cecum.  The scope was removed slowly by carefully examining the color, texture, anatomy, and integrity mucosa on the way out.  The patient was recovered in endoscopy and discharged home in satisfactory condition.       COLON FINDINGS: Mild diverticulosis was noted throughout the entire examined colon.  , The colon IS redundant.  Manual abdominal counter-pressure was used to reach the cecum, and Large internal hemorrhoids were found.  PREP QUALITY: poor CECAL W/D TIME: 14 minutes  COMPLICATIONS: None  ENDOSCOPIC IMPRESSION: 1.   Mild diverticulosis was noted throughout the entire examined colon 2.   The colon IS redundant 3.   Large internal hemorrhoids  RECOMMENDATIONS: HIGH FIBER DIET REPEAT COLONOSCOPY WITHIN THE NEXT 2 WEEKS. CLEAR LIQUID DIET STARTING WITH SUPPER ON TWO DAYS PRIOR TO HIS COLONOSCOPY.       _______________________________ Rosalie DoctorJonette Eva, MD 04/01/2013 9:49  AM

## 2013-04-03 ENCOUNTER — Encounter (HOSPITAL_COMMUNITY): Payer: Self-pay | Admitting: Gastroenterology

## 2013-04-03 ENCOUNTER — Telehealth: Payer: Self-pay | Admitting: Gastroenterology

## 2013-04-03 NOTE — Telephone Encounter (Signed)
Please call patient back at [phone]

## 2013-04-03 NOTE — Telephone Encounter (Signed)
Returned pt's call and he wanted to reschedule his colonoscopy from 04/08/2013 to May 20 at 10:30 with SF. Changed on my schedule and LMOM for Kim.

## 2013-04-07 MED ORDER — EPINEPHRINE HCL 1 MG/ML IJ SOLN
INTRAMUSCULAR | Status: AC
  Filled 2013-04-07: qty 1

## 2013-04-10 ENCOUNTER — Telehealth: Payer: Self-pay | Admitting: Family Medicine

## 2013-04-14 NOTE — Telephone Encounter (Signed)
New instructions mailed to pt for his procedure on 05/06/2013.

## 2013-04-22 ENCOUNTER — Other Ambulatory Visit: Payer: Self-pay | Admitting: Family Medicine

## 2013-04-22 NOTE — Telephone Encounter (Signed)
Called to see what type of meter patient has.  # is disconnected.

## 2013-04-22 NOTE — Telephone Encounter (Signed)
Called pharmacy and they do not have note of patient filling this for some time.  Unable to give name of last meter used.

## 2013-04-30 ENCOUNTER — Telehealth: Payer: Self-pay

## 2013-04-30 NOTE — Telephone Encounter (Signed)
Pt called to cancel TCS on 05/06/2013 with SF. York Spaniel he has a lot going on now and he is not ready. He will call when ready. Selena Batten is aware).

## 2013-05-02 NOTE — Telephone Encounter (Signed)
REVIEWED.  

## 2013-05-06 ENCOUNTER — Ambulatory Visit (HOSPITAL_COMMUNITY): Admission: RE | Admit: 2013-05-06 | Payer: Medicare Other | Source: Ambulatory Visit | Admitting: Gastroenterology

## 2013-05-06 ENCOUNTER — Encounter (HOSPITAL_COMMUNITY): Admission: RE | Payer: Self-pay | Source: Ambulatory Visit

## 2013-05-06 SURGERY — COLONOSCOPY
Anesthesia: Moderate Sedation

## 2013-05-23 ENCOUNTER — Other Ambulatory Visit: Payer: Self-pay | Admitting: Family Medicine

## 2013-05-31 ENCOUNTER — Emergency Department (HOSPITAL_COMMUNITY)
Admission: EM | Admit: 2013-05-31 | Discharge: 2013-05-31 | Disposition: A | Discharge status: Home / Self Care | Payer: Medicare Other | Attending: Emergency Medicine | Admitting: Emergency Medicine

## 2013-05-31 ENCOUNTER — Encounter (HOSPITAL_COMMUNITY): Payer: Self-pay

## 2013-05-31 DIAGNOSIS — E669 Obesity, unspecified: Secondary | ICD-10-CM | POA: Insufficient documentation

## 2013-05-31 DIAGNOSIS — Z79899 Other long term (current) drug therapy: Secondary | ICD-10-CM | POA: Insufficient documentation

## 2013-05-31 DIAGNOSIS — F209 Schizophrenia, unspecified: Secondary | ICD-10-CM | POA: Insufficient documentation

## 2013-05-31 DIAGNOSIS — I10 Essential (primary) hypertension: Secondary | ICD-10-CM | POA: Insufficient documentation

## 2013-05-31 DIAGNOSIS — F17200 Nicotine dependence, unspecified, uncomplicated: Secondary | ICD-10-CM | POA: Insufficient documentation

## 2013-05-31 DIAGNOSIS — R7309 Other abnormal glucose: Secondary | ICD-10-CM

## 2013-05-31 DIAGNOSIS — E785 Hyperlipidemia, unspecified: Secondary | ICD-10-CM | POA: Insufficient documentation

## 2013-05-31 DIAGNOSIS — Z7982 Long term (current) use of aspirin: Secondary | ICD-10-CM | POA: Insufficient documentation

## 2013-05-31 DIAGNOSIS — IMO0002 Reserved for concepts with insufficient information to code with codable children: Secondary | ICD-10-CM | POA: Insufficient documentation

## 2013-05-31 DIAGNOSIS — E1169 Type 2 diabetes mellitus with other specified complication: Secondary | ICD-10-CM | POA: Insufficient documentation

## 2013-05-31 LAB — BASIC METABOLIC PANEL
BUN: 15 mg/dL (ref 6–23)
CO2: 22 mEq/L (ref 19–32)
Calcium: 9.6 mg/dL (ref 8.4–10.5)
Chloride: 97 mEq/L (ref 96–112)
Creatinine, Ser: 1.14 mg/dL (ref 0.50–1.35)
GFR calc Af Amer: 77 mL/min — ABNORMAL LOW (ref >90)
GFR calc non Af Amer: 67 mL/min — ABNORMAL LOW (ref >90)
Glucose, Bld: 66 mg/dL — ABNORMAL LOW (ref 70–99)
Potassium: 3.3 mEq/L — ABNORMAL LOW (ref 3.5–5.1)
Sodium: 135 mEq/L (ref 135–145)

## 2013-05-31 LAB — GLUCOSE, CAPILLARY
Glucose-Capillary: 72 mg/dL (ref 70–99)
Glucose-Capillary: 67 mg/dL — ABNORMAL LOW (ref 70–99)

## 2013-05-31 NOTE — ED Notes (Signed)
My blood sugar drops a lot, and when I take something to make it go up it goes up too high, then it drops again per pt. Blood sugar 543 today per pt. I didn't check it when it dropped per pt.

## 2013-05-31 NOTE — ED Provider Notes (Signed)
History    This chart was scribed for [employer]. Rubin Payor, MD by Sofie Rower, ED Scribe. The patient was seen in room [facility]19/[facility] and the patient's care was started at 8:54PM.    CSN: [id number]  Arrival date & time 05/31/13  2014   First MD Initiated Contact with Patient 05/31/13 2054      Chief Complaint  Patient presents with  . Hyperglycemia    (Consider location/radiation/quality/duration/timing/severity/associated sxs/prior treatment) The history is provided by the patient and the spouse. No language interpreter was used.    Matthew Molina is a 62 y.o. male , with a hx of hyperlipidemia, obesity, hypertension, and diabetes mellitus, who presents to the Emergency Department complaining of sudden, progressively worsening, hypergclycemia (CBG 543 PTA, 72, taken at [facility] this evening, 05/31/13), onset today (05/31/13). The pt reports he felt as if his blood sugar dropped earlier this evening, where he proceeded to eat a few pieces of candy, which he informs, made his blood sugar elevate above 500. The pt claims to be eating and drinking normally. He informs he is currently taking metformin, 1000 mg, once in the AM and once in the PM, in addition to taking glipizide, 10 mg.   The pt denies nausea, vomiting, diarrhea, chest pain, difficulty urinating and increased urinary frequency.   The pt is a current everyday smoker, however, he does not drink alcohol.   PCP is Dr. Lodema Hong.    Past Medical History  Diagnosis Date  . Nicotine addiction   . Schizophrenia   . Hyperlipidemia   . Obesity   . Hypertension   . Diabetes mellitus     Past Surgical History  Procedure Laterality Date  . Colonoscopy    . Colonoscopy N/A 04/01/2013    Procedure: COLONOSCOPY;  Molina: West Bali, MD;  Location: [facility];  Service: Endoscopy;  Laterality: N/A;  10:00 AM-moved to [address] notified pt    Family History  Problem Relation Age of Onset  . Diabetes Mother   . Hypertension  Mother   . Stroke Mother     History  Substance Use Topics  . Smoking status: Current Every Day Smoker -- 1.00 packs/day    Types: Cigarettes  . Smokeless tobacco: Not on file  . Alcohol Use: No      Review of Systems  Cardiovascular: Negative for chest pain.  Gastrointestinal: Negative for nausea, vomiting and diarrhea.  Genitourinary: Negative for frequency and difficulty urinating.  All other systems reviewed and are negative.    Allergies  Sertraline hcl  Home Medications   Current Outpatient Rx  Name  Route  Sig  Dispense  Refill  . amLODipine (NORVASC) 10 MG tablet   Oral   Take 10 mg by mouth daily.         Marland Kitchen aspirin 81 MG tablet   Oral   Take 81 mg by mouth daily.         . budesonide-formoterol (SYMBICORT) 80-4.5 MCG/ACT inhaler   Inhalation   Inhale 2 puffs into the lungs 2 (two) times daily.         . busPIRone (BUSPAR) 7.5 MG tablet   Oral   Take 1 tablet (7.5 mg total) by mouth 2 (two) times daily.   60 tablet   2   . glipiZIDE (GLUCOTROL XL) 10 MG 24 hr tablet   Oral   Take 10 mg by mouth daily.         Marland Kitchen ibuprofen (ADVIL,MOTRIN) 800 MG  tablet   Oral   Take 800 mg by mouth every 8 (eight) hours as needed for pain.         . metFORMIN (GLUCOPHAGE) 1000 MG tablet   Oral   Take 1,000 mg by mouth 2 (two) times daily.         . pravastatin (PRAVACHOL) 40 MG tablet   Oral   Take 40 mg by mouth at bedtime.         . risperidone (RISPERDAL) 4 MG tablet   Oral   Take 4 mg by mouth at bedtime.         Marland Kitchen SPIRIVA HANDIHALER 18 MCG inhalation capsule   Inhalation   Place 1 capsule into inhaler and inhale daily.         Marland Kitchen terbinafine (LAMISIL) 250 MG tablet   Oral   Take 250 mg by mouth daily.         Marland Kitchen triamterene-hydrochlorothiazide (MAXZIDE-25) 37.5-25 MG per tablet   Oral   Take 1 tablet by mouth daily.         . valsartan (DIOVAN) 320 MG tablet   Oral   Take 320 mg by mouth daily.           BP 117/80   Pulse 82  Temp(Src) 98.4 F (36.9 C) (Oral)  Resp 20  Ht 6\' 3"  (1.905 m)  Wt 242 lb (109.77 kg)  BMI 30.25 kg/m2  SpO2 99%  Physical Exam  Nursing note and vitals reviewed. Constitutional: He is oriented to person, place, and time. He appears well-developed and well-nourished. No distress.  HENT:  Head: Normocephalic and atraumatic.  Eyes: EOM are normal. Pupils are equal, round, and reactive to light.  Neck: Neck supple. No tracheal deviation present.  Cardiovascular: Normal rate, regular rhythm and normal heart sounds.  Exam reveals no gallop and no friction rub.   No murmur heard. Pulmonary/Chest: Effort normal and breath sounds normal. No respiratory distress. He has no wheezes.  Abdominal: Soft. Bowel sounds are normal. He exhibits no distension. There is no tenderness.  Musculoskeletal: Normal range of motion. He exhibits no edema.  Neurological: He is alert and oriented to person, place, and time. No sensory deficit.  Skin: Skin is warm and dry.  Psychiatric: He has a normal mood and affect. His behavior is normal.    ED Course  Procedures (including critical care time)  DIAGNOSTIC STUDIES:   COORDINATION OF CARE:  9:02 PM- Treatment plan discussed with patient. Pt agrees with treatment.  10:48 PM- Recheck. Treatment plan discussed with patient. Pt agrees with treatment.       Results for orders placed during the hospital encounter of 05/31/13  GLUCOSE, CAPILLARY      Result Value Range   Glucose-Capillary 72  70 - 99 mg/dL  BASIC METABOLIC PANEL      Result Value Range   Sodium 135  135 - 145 mEq/L   Potassium 3.3 (*) 3.5 - 5.1 mEq/L   Chloride 97  96 - 112 mEq/L   CO2 22  19 - 32 mEq/L   Glucose, Bld 66 (*) 70 - 99 mg/dL   BUN 15  6 - 23 mg/dL   Creatinine, Ser 1.19  0.50 - 1.35 mg/dL   Calcium 9.6  8.4 - 14.7 mg/dL   GFR calc non Af Amer 67 (*) >90 mL/min   GFR calc Af Amer 77 (*) >90 mL/min  GLUCOSE, CAPILLARY      Result Value Range  Glucose-Capillary 67 (*) 70 - 99 mg/dL   Comment 1 Documented in Chart        No results found.   1. Labile blood glucose       MDM  Patient presents after likely hypoglycemia home and then hyperglycemia after he took food. He states he has had his sugar go higher on the past. He is on metformin and glipizide. His been doing well otherwise. Will ED his blood sugars stayed stable near 70. He monitors his sugars at home and will eat a snack when he gets home. He has followup with his PCP in the morning. He'll take only half of his glipizide dose tomorrow to further insure against hypoglycemia      I personally performed the services described in this documentation, which was scribed in my presence. The recorded information has been reviewed and is accurate.     Juliet Rude. Rubin Payor, MD 05/31/13 [phone]

## 2013-06-02 ENCOUNTER — Encounter: Payer: Self-pay | Admitting: Family Medicine

## 2013-06-02 ENCOUNTER — Ambulatory Visit (INDEPENDENT_AMBULATORY_CARE_PROVIDER_SITE_OTHER): Payer: Medicare Other | Admitting: Family Medicine

## 2013-06-02 VITALS — BP 118/78 | HR 87 | Resp 16 | Ht 73.5 in | Wt 255.0 lb

## 2013-06-02 DIAGNOSIS — R5383 Other fatigue: Secondary | ICD-10-CM

## 2013-06-02 DIAGNOSIS — M549 Dorsalgia, unspecified: Secondary | ICD-10-CM

## 2013-06-02 DIAGNOSIS — F209 Schizophrenia, unspecified: Secondary | ICD-10-CM

## 2013-06-02 DIAGNOSIS — A499 Bacterial infection, unspecified: Secondary | ICD-10-CM

## 2013-06-02 DIAGNOSIS — E785 Hyperlipidemia, unspecified: Secondary | ICD-10-CM

## 2013-06-02 DIAGNOSIS — R5381 Other malaise: Secondary | ICD-10-CM

## 2013-06-02 DIAGNOSIS — E119 Type 2 diabetes mellitus without complications: Secondary | ICD-10-CM

## 2013-06-02 DIAGNOSIS — E669 Obesity, unspecified: Secondary | ICD-10-CM

## 2013-06-02 DIAGNOSIS — I10 Essential (primary) hypertension: Secondary | ICD-10-CM

## 2013-06-02 DIAGNOSIS — E1169 Type 2 diabetes mellitus with other specified complication: Secondary | ICD-10-CM

## 2013-06-02 DIAGNOSIS — F17200 Nicotine dependence, unspecified, uncomplicated: Secondary | ICD-10-CM

## 2013-06-02 MED ORDER — MUPIROCIN 2 % EX OINT: TOPICAL_OINTMENT | CUTANEOUS | Status: DC

## 2013-06-02 MED ORDER — METHYLPREDNISOLONE ACETATE 80 MG/ML IJ SUSP
80.0000 mg | Freq: Once | INTRAMUSCULAR | Status: AC
  Administered 2013-06-02: 80 mg via INTRAMUSCULAR

## 2013-06-02 MED ORDER — KETOROLAC TROMETHAMINE 60 MG/2ML IM SOLN
60.0000 mg | Freq: Once | INTRAMUSCULAR | Status: AC
  Administered 2013-06-02: 60 mg via INTRAMUSCULAR

## 2013-06-02 NOTE — Assessment & Plan Note (Signed)
Reports recent uncontrolled blood sugar was in the Ed this past weekend stating his blood sugar was over 400, will get updated hBa1c

## 2013-06-02 NOTE — Assessment & Plan Note (Signed)
Denies suicidal or homicidal ideation or hallucinations. Reports often h as little inclination to get out of ed, care himself or fix food, asking for in home help which he apparently really needs

## 2013-06-02 NOTE — Assessment & Plan Note (Signed)
Unchanged. Patient counseled for approximately 5 minutes regarding the health risks of ongoing nicotine use, specifically all types of cancer, heart disease, stroke and respiratory failure. The options available for help with cessation ,the behavioral changes to assist the process, and the option to either gradully reduce usage  Or abruptly stop.is also discussed. Pt is also encouraged to set specific goals in number of cigarettes used daily, as well as to set a quit date.  

## 2013-06-02 NOTE — Assessment & Plan Note (Addendum)
toradol 60 mg and depo medrol 80 mg IM in office, pt also to be referre for PCS services, difficulty caring for himself

## 2013-06-02 NOTE — Assessment & Plan Note (Signed)
Controlled, no change in medication  

## 2013-06-02 NOTE — Patient Instructions (Addendum)
F/u in mid July  Fasting lipid, cmp and EGFr, HBA1C,CBc, tSH microalb, before visit.  Antibiotic ointment prescribed to put on sore on scalp, do nat scratch this   You need to cut back on cigarettes so you quit  You are being referred for pCS services, since you need help with copoking, bathing , cleaning your apt etc

## 2013-06-02 NOTE — Progress Notes (Signed)
  Subjective:    Patient ID: Matthew Molina, male    DOB: November 26, 1951, 62 y.o.   MRN: [id number]  HPI Pt in for Ed f/u was there this past weekend with blood sugar a home over 400, states it has been fluctuating but also states he drinks sodas whenever he wants C/o increased back ain requests injections, states unable to care for himself an his apt needs help with ADLs , states his mental condition is also a part of the problem Sore at back of head x 1 week which he scratches, states they ar popping up on his back also, none visible on exam. Denies fever or chills C/o stomach swelling , thinks h has water in it, he experiences intermittent bloating, denies PND, orthopnea or leg swelling   Review of Systems See HPI Denies recent fever or chills. Denies sinus pressure, nasal congestion, ear pain or sore throat. Denies chest congestion, productive cough or wheezing. Denies chest pains, palpitations and leg swelling Denies abdominal pain, nausea, vomiting,diarrhea or constipation.   Denies dysuria, frequency, hesitancy or incontinence. Denies headaches, seizures, numbness, or tingling.  Denies skin break down or rash.        Objective:   Physical Exam  Patient alert and oriented and in no cardiopulmonary distress.  HEENT: No facial asymmetry, EOMI, no sinus tenderness,  oropharynx pink and moist.  Neck supple no adenopathy.  Chest: Clear to auscultation bilaterally.Decrease air entry  CVS: S1, S2 no murmurs, no S3.  ABD: Soft non tender. Bowel sounds normal.No ascites on exam  Ext: No edema  MS: decrease ROM spine, shoulders, hips and knees.  Skin: Intact, dry, pustule with dried scab in occipital region Psych: Good eye contact, flat  affect. Memory intact not anxious or depressed appearing.  CNS: CN 2-12 intact, power, tone and sensation normal throughout.       Assessment & Plan:

## 2013-06-03 ENCOUNTER — Other Ambulatory Visit: Payer: Self-pay

## 2013-06-03 DIAGNOSIS — F411 Generalized anxiety disorder: Secondary | ICD-10-CM

## 2013-06-03 MED ORDER — BUSPIRONE HCL 7.5 MG PO TABS: 7.5000 mg | ORAL_TABLET | Freq: Two times a day (BID) | ORAL | Status: DC

## 2013-06-03 MED ORDER — VALSARTAN 320 MG PO TABS: 320.0000 mg | ORAL_TABLET | Freq: Every day | ORAL | Status: DC

## 2013-06-03 MED ORDER — AMLODIPINE BESYLATE 10 MG PO TABS: 10.0000 mg | ORAL_TABLET | Freq: Every day | ORAL | Status: DC

## 2013-06-03 MED ORDER — TRIAMTERENE-HCTZ 37.5-25 MG PO TABS: 1.0000 | ORAL_TABLET | Freq: Every day | ORAL | Status: DC

## 2013-06-04 ENCOUNTER — Telehealth: Payer: Self-pay | Admitting: Family Medicine

## 2013-06-04 NOTE — Telephone Encounter (Signed)
Called and spoke with patient.  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  He was given the contact number for [employer].

## 2013-06-04 NOTE — Telephone Encounter (Signed)
Form refaxed in case it was not received 1st time.

## 2013-06-05 ENCOUNTER — Other Ambulatory Visit: Payer: Self-pay | Admitting: Family Medicine

## 2013-06-10 ENCOUNTER — Encounter (HOSPITAL_COMMUNITY): Payer: Self-pay

## 2013-06-10 ENCOUNTER — Emergency Department (HOSPITAL_COMMUNITY)
Admission: EM | Admit: 2013-06-10 | Discharge: 2013-06-10 | Disposition: A | Discharge status: Home / Self Care | Payer: Medicare Other | Attending: Emergency Medicine | Admitting: Emergency Medicine

## 2013-06-10 DIAGNOSIS — Z8639 Personal history of other endocrine, nutritional and metabolic disease: Secondary | ICD-10-CM | POA: Insufficient documentation

## 2013-06-10 DIAGNOSIS — L02818 Cutaneous abscess of other sites: Secondary | ICD-10-CM | POA: Insufficient documentation

## 2013-06-10 DIAGNOSIS — I10 Essential (primary) hypertension: Secondary | ICD-10-CM | POA: Insufficient documentation

## 2013-06-10 DIAGNOSIS — Z7982 Long term (current) use of aspirin: Secondary | ICD-10-CM | POA: Insufficient documentation

## 2013-06-10 DIAGNOSIS — L02811 Cutaneous abscess of head [any part, except face]: Secondary | ICD-10-CM

## 2013-06-10 DIAGNOSIS — Z862 Personal history of diseases of the blood and blood-forming organs and certain disorders involving the immune mechanism: Secondary | ICD-10-CM | POA: Insufficient documentation

## 2013-06-10 DIAGNOSIS — E119 Type 2 diabetes mellitus without complications: Secondary | ICD-10-CM | POA: Insufficient documentation

## 2013-06-10 DIAGNOSIS — F209 Schizophrenia, unspecified: Secondary | ICD-10-CM | POA: Insufficient documentation

## 2013-06-10 DIAGNOSIS — R51 Headache: Secondary | ICD-10-CM | POA: Insufficient documentation

## 2013-06-10 DIAGNOSIS — F17200 Nicotine dependence, unspecified, uncomplicated: Secondary | ICD-10-CM | POA: Insufficient documentation

## 2013-06-10 DIAGNOSIS — Z79899 Other long term (current) drug therapy: Secondary | ICD-10-CM | POA: Insufficient documentation

## 2013-06-10 MED ORDER — HYDROCODONE-ACETAMINOPHEN 5-325 MG PO TABS: 1.0000 | ORAL_TABLET | ORAL | Status: DC | PRN

## 2013-06-10 MED ORDER — DOXYCYCLINE HYCLATE 100 MG PO CAPS: 100.0000 mg | ORAL_CAPSULE | Freq: Two times a day (BID) | ORAL | Status: AC

## 2013-06-10 MED ORDER — AMOXICILLIN 500 MG PO CAPS: 500.0000 mg | ORAL_CAPSULE | Freq: Three times a day (TID) | ORAL | Status: DC

## 2013-06-10 NOTE — ED Provider Notes (Signed)
History    CSN: [id number] Arrival date & time 06/10/13  1058  First MD Initiated Contact with Patient 06/10/13 1150     Chief Complaint  Patient presents with  . Abscess   (Consider location/radiation/quality/duration/timing/severity/associated sxs/prior Treatment) Patient is a 62 y.o. male presenting with abscess. The history is provided by the patient.  Abscess Location:  Head/neck Head/neck abscess location:  Scalp Abscess quality: draining, painful and warmth   Red streaking: no   Duration:  1 week Progression:  Worsening Pain details:    Quality:  Aching   Severity:  Moderate   Timing:  Intermittent   Progression:  Worsening Chronicity:  New Context: diabetes   Relieved by:  Nothing Worsened by:  Draining/squeezing Ineffective treatments:  Topical antibiotics Associated symptoms: headaches   Associated symptoms: no fever    Past Medical History  Diagnosis Date  . Nicotine addiction   . Schizophrenia   . Hyperlipidemia   . Obesity   . Hypertension   . Diabetes mellitus    Past Surgical History  Procedure Laterality Date  . Colonoscopy    . Colonoscopy N/A 04/01/2013    Procedure: COLONOSCOPY;  Surgeon: West Bali, MD;  Location: [facility];  Service: Endoscopy;  Laterality: N/A;  10:00 AM-moved to [address] notified pt   Family History  Problem Relation Age of Onset  . Diabetes Mother   . Hypertension Mother   . Stroke Mother    History  Substance Use Topics  . Smoking status: Current Every Day Smoker -- 1.00 packs/day    Types: Cigarettes  . Smokeless tobacco: Not on file  . Alcohol Use: No    Review of Systems  Constitutional: Negative for fever and activity change.       All ROS Neg except as noted in HPI  HENT: Negative for nosebleeds and neck pain.   Eyes: Negative for photophobia and discharge.  Respiratory: Negative for cough, shortness of breath and wheezing.   Cardiovascular: Negative for chest pain and palpitations.    Gastrointestinal: Negative for abdominal pain and blood in stool.  Genitourinary: Negative for dysuria, frequency and hematuria.  Musculoskeletal: Negative for back pain and arthralgias.  Skin: Negative.   Neurological: Positive for headaches. Negative for dizziness, seizures and speech difficulty.  Psychiatric/Behavioral: Negative for hallucinations and confusion.    Allergies  Sertraline hcl  Home Medications   Current Outpatient Rx  Name  Route  Sig  Dispense  Refill  . amLODipine (NORVASC) 10 MG tablet   Oral   Take 10 mg by mouth daily.         Marland Kitchen aspirin 325 MG EC tablet   Oral   Take 325 mg by mouth daily.         . budesonide-formoterol (SYMBICORT) 80-4.5 MCG/ACT inhaler   Inhalation   Inhale 2 puffs into the lungs 2 (two) times daily.         . busPIRone (BUSPAR) 7.5 MG tablet   Oral   Take 7.5 mg by mouth 2 (two) times daily.         Marland Kitchen glipiZIDE (GLUCOTROL XL) 10 MG 24 hr tablet   Oral   Take 10 mg by mouth daily.         Marland Kitchen ibuprofen (ADVIL,MOTRIN) 800 MG tablet   Oral   Take 800 mg by mouth every 8 (eight) hours as needed for pain.         . metFORMIN (GLUCOPHAGE) 1000 MG tablet  Oral   Take 1,000 mg by mouth 2 (two) times daily.         Marland Kitchen oxymetazoline (AFRIN) 0.05 % nasal spray   Nasal   Place 2 sprays into the nose 2 (two) times daily as needed for congestion.         . pravastatin (PRAVACHOL) 40 MG tablet   Oral   Take 40 mg by mouth at bedtime.         . risperidone (RISPERDAL) 4 MG tablet   Oral   Take 4 mg by mouth at bedtime.         . terbinafine (LAMISIL) 250 MG tablet   Oral   Take 250 mg by mouth daily.         Marland Kitchen tiotropium (SPIRIVA) 18 MCG inhalation capsule   Inhalation   Place 18 mcg into inhaler and inhale daily.         Marland Kitchen triamterene-hydrochlorothiazide (MAXZIDE-25) 37.5-25 MG per tablet   Oral   Take 1 tablet by mouth daily.         . valsartan (DIOVAN) 320 MG tablet   Oral   Take 320 mg by  mouth daily.          BP 138/66  Pulse 95  Temp(Src) 97.9 F (36.6 C) (Oral)  Resp 20  Ht 6\' 1"  (1.854 m)  Wt 242 lb (109.77 kg)  BMI 31.93 kg/m2  SpO2 97% Physical Exam  Nursing note and vitals reviewed. Constitutional: He is oriented to person, place, and time. He appears well-developed and well-nourished.  Non-toxic appearance.  HENT:  Head: Normocephalic.    Right Ear: Tympanic membrane and external ear normal.  Left Ear: Tympanic membrane and external ear normal.  Eyes: EOM and lids are normal. Pupils are equal, round, and reactive to light.  Neck: Normal range of motion. Neck supple. Carotid bruit is not present.  Cardiovascular: Normal rate, regular rhythm, normal heart sounds, intact distal pulses and normal pulses.   Pulmonary/Chest: Breath sounds normal. No respiratory distress.  Abdominal: Soft. Bowel sounds are normal. There is no tenderness. There is no guarding.  Musculoskeletal: Normal range of motion.  Lymphadenopathy:       Head (right side): No submandibular adenopathy present.       Head (left side): No submandibular adenopathy present.    He has no cervical adenopathy.  Neurological: He is alert and oriented to person, place, and time. He has normal strength. No cranial nerve deficit or sensory deficit.  Skin: Skin is warm and dry.  Psychiatric: He has a normal mood and affect. His speech is normal.    ED Course  Procedures (including critical care time) Labs Reviewed - No data to display No results found. No diagnosis found.  MDM  I have reviewed nursing notes, vital signs, and all appropriate lab and imaging results for this patient.  Patient has a history of diabetes. He has had a problem with an abscess on the neck for a little over a week. He has tried Bactroban ointment without success. The examination today reveals no tachycardia or high fever. And no evidence of cellulitis in the area.  Plan at this time is for the patient to receive  Amoxil, doxycycline, and Norco for pain. Patient to use warm tub soaks daily. Patient advised to see his primary care physician next week for followup and recheck.  Kathie Dike, PA-C 06/10/13 1248

## 2013-06-10 NOTE — ED Notes (Signed)
Pt reports has abscess to back of head x 1 week.  Reports saw his pcp MOnday and was put on some antibiotic cream.  Pt says is causes the area to burn.

## 2013-06-10 NOTE — ED Provider Notes (Signed)
Medical screening examination/treatment/procedure(s) were performed by non-physician practitioner and as supervising physician I was immediately available for consultation/collaboration.   Shelda Jakes, MD 06/10/13 [phone]

## 2013-06-24 ENCOUNTER — Ambulatory Visit: Payer: Medicare Other | Admitting: Family Medicine

## 2013-06-26 ENCOUNTER — Other Ambulatory Visit: Payer: Self-pay

## 2013-07-02 ENCOUNTER — Ambulatory Visit: Payer: Medicare Other | Admitting: Family Medicine

## 2013-07-15 ENCOUNTER — Ambulatory Visit: Payer: Medicare Other | Admitting: Family Medicine

## 2013-07-17 ENCOUNTER — Other Ambulatory Visit: Payer: Self-pay | Admitting: Family Medicine

## 2013-08-07 ENCOUNTER — Encounter: Payer: Self-pay | Admitting: Family Medicine

## 2013-08-18 ENCOUNTER — Other Ambulatory Visit: Payer: Self-pay | Admitting: Family Medicine

## 2013-08-25 ENCOUNTER — Encounter: Payer: Self-pay | Admitting: Family Medicine

## 2013-09-02 ENCOUNTER — Other Ambulatory Visit: Payer: Self-pay | Admitting: Family Medicine

## 2013-09-02 ENCOUNTER — Ambulatory Visit (INDEPENDENT_AMBULATORY_CARE_PROVIDER_SITE_OTHER): Payer: Medicare Other | Admitting: Family Medicine

## 2013-09-02 ENCOUNTER — Encounter: Payer: Self-pay | Admitting: Family Medicine

## 2013-09-02 VITALS — BP 122/76 | HR 88 | Resp 18 | Ht 73.5 in | Wt 249.0 lb

## 2013-09-02 DIAGNOSIS — E119 Type 2 diabetes mellitus without complications: Secondary | ICD-10-CM

## 2013-09-02 DIAGNOSIS — E669 Obesity, unspecified: Secondary | ICD-10-CM

## 2013-09-02 DIAGNOSIS — F209 Schizophrenia, unspecified: Secondary | ICD-10-CM

## 2013-09-02 DIAGNOSIS — E1169 Type 2 diabetes mellitus with other specified complication: Secondary | ICD-10-CM

## 2013-09-02 DIAGNOSIS — F17200 Nicotine dependence, unspecified, uncomplicated: Secondary | ICD-10-CM

## 2013-09-02 DIAGNOSIS — E785 Hyperlipidemia, unspecified: Secondary | ICD-10-CM

## 2013-09-02 DIAGNOSIS — Z23 Encounter for immunization: Secondary | ICD-10-CM

## 2013-09-02 DIAGNOSIS — I10 Essential (primary) hypertension: Secondary | ICD-10-CM

## 2013-09-02 LAB — COMPLETE METABOLIC PANEL WITH GFR
ALT: 27 U/L (ref 0–53)
AST: 19 U/L (ref 0–37)
Albumin: 4.2 g/dL (ref 3.5–5.2)
Alkaline Phosphatase: 44 U/L (ref 39–117)
BUN: 10 mg/dL (ref 6–23)
CO2: 28 mEq/L (ref 19–32)
Calcium: 10.2 mg/dL (ref 8.4–10.5)
Chloride: 102 mEq/L (ref 96–112)
Creat: 1.16 mg/dL (ref 0.50–1.35)
GFR, Est African American: 77 mL/min
GFR, Est Non African American: 67 mL/min
Glucose, Bld: 71 mg/dL (ref 70–99)
Potassium: 3.7 mEq/L (ref 3.5–5.3)
Sodium: 136 mEq/L (ref 135–145)
Total Bilirubin: 0.4 mg/dL (ref 0.3–1.2)
Total Protein: 7.4 g/dL (ref 6.0–8.3)

## 2013-09-02 LAB — CBC WITH DIFFERENTIAL/PLATELET
Basophils Absolute: 0 10*3/uL (ref 0.0–0.1)
Basophils Relative: 0 % (ref 0–1)
Eosinophils Absolute: 0.3 10*3/uL (ref 0.0–0.7)
Eosinophils Relative: 5 % (ref 0–5)
HCT: 38.9 % — ABNORMAL LOW (ref 39.0–52.0)
Hemoglobin: 13.6 g/dL (ref 13.0–17.0)
Lymphocytes Relative: 43 % (ref 12–46)
Lymphs Abs: 2.9 10*3/uL (ref 0.7–4.0)
MCH: 29.9 pg (ref 26.0–34.0)
MCHC: 35 g/dL (ref 30.0–36.0)
MCV: 85.5 fL (ref 78.0–100.0)
Monocytes Absolute: 0.7 10*3/uL (ref 0.1–1.0)
Monocytes Relative: 10 % (ref 3–12)
Neutro Abs: 2.7 10*3/uL (ref 1.7–7.7)
Neutrophils Relative %: 42 % — ABNORMAL LOW (ref 43–77)
Platelets: 229 10*3/uL (ref 150–400)
RBC: 4.55 MIL/uL (ref 4.22–5.81)
RDW: 15 % (ref 11.5–15.5)
WBC: 6.6 10*3/uL (ref 4.0–10.5)

## 2013-09-02 LAB — LIPID PANEL
Cholesterol: 138 mg/dL (ref 0–200)
HDL: 29 mg/dL — ABNORMAL LOW (ref >39)
LDL Cholesterol: 45 mg/dL (ref 0–99)
Total CHOL/HDL Ratio: 4.8 Ratio
Triglycerides: 319 mg/dL — ABNORMAL HIGH (ref <150)
VLDL: 64 mg/dL — ABNORMAL HIGH (ref 0–40)

## 2013-09-02 NOTE — Patient Instructions (Addendum)
F/u in 4 month, call if you need me before   Microalb from office today.   Flu vaccine today  You will get script for shingles vaccine you need this take to your pharmacy get as soon as possible, today is fine  You are referred for eye exam  You will get info on smoking cessation classes in the community, also tele # to call to help you to quit  Labs today, CBC, lipid, cmp and EGFR, HBA1C, TSH, PSA

## 2013-09-03 ENCOUNTER — Other Ambulatory Visit: Payer: Self-pay

## 2013-09-03 ENCOUNTER — Other Ambulatory Visit: Payer: Self-pay | Admitting: Family Medicine

## 2013-09-03 LAB — HEMOGLOBIN A1C
Hgb A1c MFr Bld: 6 % — ABNORMAL HIGH (ref <5.7)
Mean Plasma Glucose: 126 mg/dL — ABNORMAL HIGH (ref <117)

## 2013-09-03 LAB — MICROALBUMIN / CREATININE URINE RATIO
Creatinine, Urine: 39.5 mg/dL
Microalb Creat Ratio: 12.7 mg/g (ref 0.0–30.0)
Microalb, Ur: 0.5 mg/dL (ref 0.00–1.89)

## 2013-09-03 LAB — TSH: TSH: 1.316 u[IU]/mL (ref 0.350–4.500)

## 2013-09-03 MED ORDER — PRAVASTATIN SODIUM 40 MG PO TABS: 40.0000 mg | ORAL_TABLET | Freq: Every day | ORAL | Status: DC

## 2013-09-03 MED ORDER — METFORMIN HCL 1000 MG PO TABS: 1000.0000 mg | ORAL_TABLET | Freq: Two times a day (BID) | ORAL | Status: DC

## 2013-09-17 ENCOUNTER — Other Ambulatory Visit: Payer: Self-pay | Admitting: Family Medicine

## 2013-09-20 ENCOUNTER — Other Ambulatory Visit: Payer: Self-pay | Admitting: Family Medicine

## 2013-09-22 NOTE — Telephone Encounter (Signed)
Please advise.  Is this filled by this office?  I don't see where he sees psych.  Last refill given by ED on 10/1

## 2013-09-23 ENCOUNTER — Other Ambulatory Visit: Payer: Self-pay | Admitting: Family Medicine

## 2013-10-13 NOTE — Assessment & Plan Note (Signed)
Uncontrolled, elevated TG, needs to reduce fried and fatty foods, continue statin at same dose

## 2013-10-13 NOTE — Progress Notes (Signed)
  Subjective:    Patient ID: TU BAYLE, male    DOB: [birth year], 62 y.o.   MRN: [id number]  HPI The PT is here for follow up and re-evaluation of chronic medical conditions, medication management and review of any available recent lab and radiology data.  Preventive health is updated, specifically  Cancer screening and Immunization.   Questions or concerns regarding consultations or procedures which the PT has had in the interim are  addressed. The PT denies any adverse reactions to current medications since the last visit.  There are no new concerns.  There are no specific complaints       Review of Systems See HPI Denies recent fever or chills. Denies sinus pressure, nasal congestion, ear pain or sore throat. Denies chest congestion, productive cough or wheezing. Denies chest pains, palpitations and leg swelling Denies abdominal pain, nausea, vomiting,diarrhea or constipation.   Denies dysuria, frequency, hesitancy or incontinence. Denies joint pain, swelling and limitation in mobility. Denies headaches, seizures, numbness, or tingling. Denies depression, anxiety or insomnia. Denies skin break down or rash.        Objective:   Physical Exam  Patient alert and oriented and in no cardiopulmonary distress.  HEENT: No facial asymmetry, EOMI, no sinus tenderness,  oropharynx pink and moist.  Neck supple no adenopathy.  Chest: Clear to auscultation bilaterally.Decreased though adequate air entry throughout  CVS: S1, S2 no murmurs, no S3.  ABD: Soft non tender. Bowel sounds normal.  Ext: No edema  MS: Adequate ROM spine, shoulders, hips and knees.  Skin: Intact, no ulcerations or rash noted.  Psych: Good eye contact, flat  affect. Memory intact not anxious or depressed appearing.  CNS: CN 2-12 intact, power, tone and sensation normal throughout.       Assessment & Plan:

## 2013-10-13 NOTE — Assessment & Plan Note (Signed)
Unchanged. Patient re-educated about  the importance of commitment to a  minimum of 150 minutes of exercise per week. The importance of healthy food choices with portion control discussed. Encouraged to start a food diary, count calories and to consider  joining a support group. Sample diet sheets offered. Goals set by the patient for the next several months.    

## 2013-10-13 NOTE — Assessment & Plan Note (Signed)
Controlled, no change in medication Patient advised to reduce carb and sweets, commit to regular physical activity, take meds as prescribed, test blood as directed, and attempt to lose weight, to improve blood sugar control.  

## 2013-10-13 NOTE — Assessment & Plan Note (Signed)
Stable on current meds continue same, denies hallucinations, not suicidal or homicidal

## 2013-10-13 NOTE — Assessment & Plan Note (Signed)
Controlled, no change in medication DASH diet and commitment to daily physical activity for a minimum of 30 minutes discussed and encouraged, as a part of hypertension management. The importance of attaining a healthy weight is also discussed.  

## 2013-10-13 NOTE — Assessment & Plan Note (Signed)
Unchanged, wants to quit and plans to attend a class Patient counseled for approximately 5 minutes regarding the health risks of ongoing nicotine use, specifically all types of cancer, heart disease, stroke and respiratory failure. The options available for help with cessation ,the behavioral changes to assist the process, and the option to either gradully reduce usage  Or abruptly stop.is also discussed. Pt is also encouraged to set specific goals in number of cigarettes used daily, as well as to set a quit date.

## 2013-10-22 ENCOUNTER — Other Ambulatory Visit: Payer: Self-pay | Admitting: Family Medicine

## 2013-11-07 ENCOUNTER — Encounter (HOSPITAL_COMMUNITY): Payer: Self-pay | Admitting: Pharmacy Technician

## 2013-11-10 ENCOUNTER — Encounter (HOSPITAL_COMMUNITY): Payer: Self-pay

## 2013-11-10 ENCOUNTER — Encounter (HOSPITAL_COMMUNITY)
Admission: RE | Admit: 2013-11-10 | Discharge: 2013-11-10 | Disposition: A | Discharge status: Home / Self Care | Payer: Medicare Other | Source: Ambulatory Visit | Attending: Ophthalmology | Admitting: Ophthalmology

## 2013-11-10 DIAGNOSIS — Z01812 Encounter for preprocedural laboratory examination: Secondary | ICD-10-CM | POA: Insufficient documentation

## 2013-11-10 DIAGNOSIS — Z01818 Encounter for other preprocedural examination: Secondary | ICD-10-CM | POA: Insufficient documentation

## 2013-11-10 DIAGNOSIS — Z01810 Encounter for preprocedural cardiovascular examination: Secondary | ICD-10-CM | POA: Insufficient documentation

## 2013-11-10 HISTORY — DX: Unspecified osteoarthritis, unspecified site: M19.90

## 2013-11-10 HISTORY — DX: Chronic obstructive pulmonary disease, unspecified: J44.9

## 2013-11-10 NOTE — Progress Notes (Signed)
11/10/13 1041  OBSTRUCTIVE SLEEP APNEA  Have you ever been diagnosed with sleep apnea through a sleep study? No  Do you snore loudly (loud enough to be heard through closed doors)?  0  Do you often feel tired, fatigued, or sleepy during the daytime? 0  Has anyone observed you stop breathing during your sleep? 0  Do you have, or are you being treated for high blood pressure? 1  BMI more than 35 kg/m2? 1  Age over 63 years old? 1  Neck circumference greater than 40 cm/18 inches? 0  Gender: 1  Obstructive Sleep Apnea Score 4  Score 4 or greater  Results sent to PCP

## 2013-11-10 NOTE — Patient Instructions (Signed)
Your procedure is scheduled on: 11/20/2013  Report to [facility] at  700  AM.  Call this number if you have problems the morning of surgery: [phone]   Do not eat food or drink liquids :After Midnight.      Take these medicines the morning of surgery with A SIP OF WATER: norco, norvasc, buspar, maxzide, diovan   Do not wear jewelry, make-up or nail polish.  Do not wear lotions, powders, or perfumes.   Do not shave 48 hours prior to surgery.  Do not bring valuables to the hospital.  Contacts, dentures or bridgework may not be worn into surgery.  Leave suitcase in the car. After surgery it may be brought to your room.  For patients admitted to the hospital, checkout time is 11:00 AM the day of discharge.   Patients discharged the day of surgery will not be allowed to drive home.  :     Please read over the following fact sheets that you were given: Coughing and Deep Breathing, Surgical Site Infection Prevention, Anesthesia Post-op Instructions and Care and Recovery After Surgery    Cataract A cataract is a clouding of the lens of the eye. When a lens becomes cloudy, vision is reduced based on the degree and nature of the clouding. Many cataracts reduce vision to some degree. Some cataracts make people more near-sighted as they develop. Other cataracts increase glare. Cataracts that are ignored and become worse can sometimes look white. The white color can be seen through the pupil. CAUSES   Aging. However, cataracts may occur at any age, even in newborns.   Certain drugs.   Trauma to the eye.   Certain diseases such as diabetes.   Specific eye diseases such as chronic inflammation inside the eye or a sudden attack of a rare form of glaucoma.   Inherited or acquired medical problems.  SYMPTOMS   Gradual, progressive drop in vision in the affected eye.   Severe, rapid visual loss. This most often happens when trauma is the cause.  DIAGNOSIS  To detect a cataract, an eye doctor  examines the lens. Cataracts are best diagnosed with an exam of the eyes with the pupils enlarged (dilated) by drops.  TREATMENT  For an early cataract, vision may improve by using different eyeglasses or stronger lighting. If that does not help your vision, surgery is the only effective treatment. A cataract needs to be surgically removed when vision loss interferes with your everyday activities, such as driving, reading, or watching TV. A cataract may also have to be removed if it prevents examination or treatment of another eye problem. Surgery removes the cloudy lens and usually replaces it with a substitute lens (intraocular lens, IOL).  At a time when both you and your doctor agree, the cataract will be surgically removed. If you have cataracts in both eyes, only one is usually removed at a time. This allows the operated eye to heal and be out of danger from any possible problems after surgery (such as infection or poor wound healing). In rare cases, a cataract may be doing damage to your eye. In these cases, your caregiver may advise surgical removal right away. The vast majority of people who have cataract surgery have better vision afterward. HOME CARE INSTRUCTIONS  If you are not planning surgery, you may be asked to do the following:  Use different eyeglasses.   Use stronger or brighter lighting.   Ask your eye doctor about reducing your medicine dose  or changing medicines if it is thought that a medicine caused your cataract. Changing medicines does not make the cataract go away on its own.   Become familiar with your surroundings. Poor vision can lead to injury. Avoid bumping into things on the affected side. You are at a higher risk for tripping or falling.   Exercise extreme care when driving or operating machinery.   Wear sunglasses if you are sensitive to bright light or experiencing problems with glare.  SEEK IMMEDIATE MEDICAL CARE IF:   You have a worsening or sudden vision  loss.   You notice redness, swelling, or increasing pain in the eye.   You have a fever.  Document Released: 12/04/2005 Document Revised: 11/23/2011 Document Reviewed: 07/28/2011 [facility] Patient Information 2012 [place].PATIENT INSTRUCTIONS POST-ANESTHESIA  IMMEDIATELY FOLLOWING SURGERY:  Do not drive or operate machinery for the first twenty four hours after surgery.  Do not make any important decisions for twenty four hours after surgery or while taking narcotic pain medications or sedatives.  If you develop intractable nausea and vomiting or a severe headache please notify your doctor immediately.  FOLLOW-UP:  Please make an appointment with your surgeon as instructed. You do not need to follow up with anesthesia unless specifically instructed to do so.  WOUND CARE INSTRUCTIONS (if applicable):  Keep a dry clean dressing on the anesthesia/puncture wound site if there is drainage.  Once the wound has quit draining you may leave it open to air.  Generally you should leave the bandage intact for twenty four hours unless there is drainage.  If the epidural site drains for more than 36-48 hours please call the anesthesia department.  QUESTIONS?:  Please feel free to call your physician or the hospital operator if you have any questions, and they will be happy to assist you.

## 2013-11-14 ENCOUNTER — Other Ambulatory Visit: Payer: Self-pay | Admitting: Family Medicine

## 2013-11-17 ENCOUNTER — Other Ambulatory Visit: Payer: Self-pay | Admitting: Family Medicine

## 2013-11-17 HISTORY — PX: EYE SURGERY: SHX253

## 2013-11-19 MED ORDER — TETRACAINE HCL 0.5 % OP SOLN
OPHTHALMIC | Status: AC
  Filled 2013-11-19: qty 2

## 2013-11-19 MED ORDER — CYCLOPENTOLATE-PHENYLEPHRINE OP SOLN OPTIME - NO CHARGE
OPHTHALMIC | Status: AC
  Filled 2013-11-19: qty 2

## 2013-11-19 MED ORDER — NEOMYCIN-POLYMYXIN-DEXAMETH 3.5-10000-0.1 OP SUSP
OPHTHALMIC | Status: AC
  Filled 2013-11-19: qty 5

## 2013-11-19 MED ORDER — LIDOCAINE HCL (PF) 1 % IJ SOLN
INTRAMUSCULAR | Status: AC
  Filled 2013-11-19: qty 2

## 2013-11-19 MED ORDER — LIDOCAINE HCL 3.5 % OP GEL
OPHTHALMIC | Status: AC
  Filled 2013-11-19: qty 1

## 2013-11-20 ENCOUNTER — Ambulatory Visit (HOSPITAL_COMMUNITY): Payer: Medicare Other | Admitting: Anesthesiology

## 2013-11-20 ENCOUNTER — Encounter (HOSPITAL_COMMUNITY): Payer: Self-pay | Admitting: Ophthalmology

## 2013-11-20 ENCOUNTER — Ambulatory Visit (HOSPITAL_COMMUNITY)
Admission: RE | Admit: 2013-11-20 | Discharge: 2013-11-20 | Disposition: A | Discharge status: Home / Self Care | Payer: Medicare Other | Source: Ambulatory Visit | Attending: Ophthalmology | Admitting: Ophthalmology

## 2013-11-20 ENCOUNTER — Encounter (HOSPITAL_COMMUNITY): Payer: Medicare Other | Admitting: Anesthesiology

## 2013-11-20 ENCOUNTER — Encounter (HOSPITAL_COMMUNITY)
Admission: RE | Disposition: A | Discharge status: Home / Self Care | Payer: Self-pay | Source: Ambulatory Visit | Attending: Ophthalmology

## 2013-11-20 DIAGNOSIS — I10 Essential (primary) hypertension: Secondary | ICD-10-CM | POA: Insufficient documentation

## 2013-11-20 DIAGNOSIS — H2589 Other age-related cataract: Secondary | ICD-10-CM | POA: Insufficient documentation

## 2013-11-20 DIAGNOSIS — Z01812 Encounter for preprocedural laboratory examination: Secondary | ICD-10-CM | POA: Insufficient documentation

## 2013-11-20 DIAGNOSIS — J449 Chronic obstructive pulmonary disease, unspecified: Secondary | ICD-10-CM | POA: Insufficient documentation

## 2013-11-20 DIAGNOSIS — J4489 Other specified chronic obstructive pulmonary disease: Secondary | ICD-10-CM | POA: Insufficient documentation

## 2013-11-20 DIAGNOSIS — E119 Type 2 diabetes mellitus without complications: Secondary | ICD-10-CM | POA: Insufficient documentation

## 2013-11-20 DIAGNOSIS — H2181 Floppy iris syndrome: Secondary | ICD-10-CM | POA: Insufficient documentation

## 2013-11-20 HISTORY — PX: CATARACT EXTRACTION W/PHACO: SHX586

## 2013-11-20 LAB — GLUCOSE, CAPILLARY: Glucose-Capillary: 93 mg/dL (ref 70–99)

## 2013-11-20 SURGERY — PHACOEMULSIFICATION, CATARACT, WITH IOL INSERTION
Anesthesia: Monitor Anesthesia Care | Site: Eye | Laterality: Left

## 2013-11-20 MED ORDER — NEOMYCIN-POLYMYXIN-DEXAMETH 3.5-10000-0.1 OP SUSP
OPHTHALMIC | Status: DC | PRN
  Administered 2013-11-20: 2 [drp] via OPHTHALMIC

## 2013-11-20 MED ORDER — PROVISC 10 MG/ML IO SOLN
INTRAOCULAR | Status: DC | PRN
  Administered 2013-11-20: 0.85 mL via INTRAOCULAR

## 2013-11-20 MED ORDER — MIDAZOLAM HCL 2 MG/2ML IJ SOLN
INTRAMUSCULAR | Status: AC
  Filled 2013-11-20: qty 2

## 2013-11-20 MED ORDER — PHENYLEPHRINE HCL 2.5 % OP SOLN
1.0000 [drp] | OPHTHALMIC | Status: AC
  Administered 2013-11-20 (×3): 1 [drp] via OPHTHALMIC

## 2013-11-20 MED ORDER — LIDOCAINE HCL 3.5 % OP GEL
1.0000 "application " | Freq: Once | OPHTHALMIC | Status: AC
  Administered 2013-11-20: 1 via OPHTHALMIC

## 2013-11-20 MED ORDER — LACTATED RINGERS IV SOLN
INTRAVENOUS | Status: DC
  Administered 2013-11-20: 1000 mL via INTRAVENOUS

## 2013-11-20 MED ORDER — EPINEPHRINE HCL 1 MG/ML IJ SOLN
INTRAMUSCULAR | Status: AC
  Filled 2013-11-20: qty 1

## 2013-11-20 MED ORDER — TETRACAINE HCL 0.5 % OP SOLN
1.0000 [drp] | OPHTHALMIC | Status: AC
  Administered 2013-11-20 (×3): 1 [drp] via OPHTHALMIC

## 2013-11-20 MED ORDER — LACTATED RINGERS IV SOLN
INTRAVENOUS | Status: DC | PRN
  Administered 2013-11-20: 09:00:00 via INTRAVENOUS

## 2013-11-20 MED ORDER — POVIDONE-IODINE 5 % OP SOLN
OPHTHALMIC | Status: DC | PRN
  Administered 2013-11-20: 1 via OPHTHALMIC

## 2013-11-20 MED ORDER — FENTANYL CITRATE 0.05 MG/ML IJ SOLN
INTRAMUSCULAR | Status: AC
Start: 2013-11-20 — End: 2013-11-20
  Filled 2013-11-20: qty 2

## 2013-11-20 MED ORDER — LIDOCAINE HCL (PF) 1 % IJ SOLN
INTRAOCULAR | Status: DC | PRN
  Administered 2013-11-20: 09:00:00 via OPHTHALMIC

## 2013-11-20 MED ORDER — LIDOCAINE 3.5 % OP GEL OPTIME - NO CHARGE
OPHTHALMIC | Status: DC | PRN
  Administered 2013-11-20: 1 [drp] via OPHTHALMIC

## 2013-11-20 MED ORDER — FENTANYL CITRATE 0.05 MG/ML IJ SOLN
25.0000 ug | INTRAMUSCULAR | Status: AC
  Administered 2013-11-20 (×2): 25 ug via INTRAVENOUS

## 2013-11-20 MED ORDER — EPINEPHRINE HCL 1 MG/ML IJ SOLN
INTRAOCULAR | Status: DC | PRN
  Administered 2013-11-20: 09:00:00

## 2013-11-20 MED ORDER — MIDAZOLAM HCL 2 MG/2ML IJ SOLN
1.0000 mg | INTRAMUSCULAR | Status: DC | PRN
  Administered 2013-11-20: 2 mg via INTRAVENOUS

## 2013-11-20 MED ORDER — CYCLOPENTOLATE-PHENYLEPHRINE 0.2-1 % OP SOLN
1.0000 [drp] | OPHTHALMIC | Status: AC
  Administered 2013-11-20 (×3): 1 [drp] via OPHTHALMIC

## 2013-11-20 MED ORDER — BSS IO SOLN
INTRAOCULAR | Status: DC | PRN
  Administered 2013-11-20: 15 mL via INTRAOCULAR

## 2013-11-20 SURGICAL SUPPLY — 32 items

## 2013-11-20 NOTE — H&P (Signed)
I have reviewed the H&P, the patient was re-examined, and I have identified no interval changes in medical condition and plan of care since the history and physical of record  

## 2013-11-20 NOTE — Anesthesia Postprocedure Evaluation (Signed)
  Anesthesia Post-op Note  Patient: Matthew Molina  Procedure(s) Performed: Procedure(s) with comments: CATARACT EXTRACTION PHACO AND INTRAOCULAR LENS PLACEMENT (IOC) (Left) - CDE:10.26  Patient Location: Short Stay  Anesthesia Type:MAC  Level of Consciousness: awake, alert , oriented and patient cooperative  Airway and Oxygen Therapy: Patient Spontanous Breathing  Post-op Pain: none  Post-op Assessment: Post-op Vital signs reviewed, Patient's Cardiovascular Status Stable, Respiratory Function Stable, Patent Airway, No signs of Nausea or vomiting and Pain level controlled  Post-op Vital Signs: Reviewed and stable  Complications: No apparent anesthesia complications

## 2013-11-20 NOTE — Transfer of Care (Signed)
Immediate Anesthesia Transfer of Care Note  Patient: Matthew Molina  Procedure(s) Performed: Procedure(s) with comments: CATARACT EXTRACTION PHACO AND INTRAOCULAR LENS PLACEMENT (IOC) (Left) - CDE:10.26  Patient Location: Short Stay  Anesthesia Type:MAC  Level of Consciousness: awake, alert , oriented and patient cooperative  Airway & Oxygen Therapy: Patient Spontanous Breathing  Post-op Assessment: Report given to PACU RN and Post -op Vital signs reviewed and stable  Post vital signs: Reviewed and stable  Complications: No apparent anesthesia complications

## 2013-11-20 NOTE — Anesthesia Procedure Notes (Signed)
Procedure Name: MAC Performed by: Laynie Espy A Pre-anesthesia Checklist: Patient identified, Timeout performed, Emergency Drugs available, Suction available and Patient being monitored Oxygen Delivery Method: Nasal cannula     

## 2013-11-20 NOTE — Op Note (Signed)
Date of Admission: 11/20/2013  Date of Surgery: 11/20/2013  Pre-Op Dx: Cataract  Left  Eye  Post-Op Dx: Cataract  Left  Eye,  Dx Code 366.19, Intraoperative Floppy Iris Syndrome, Dx Code 364.81  Surgeon: Gemma Payor, M.D.  Assistants: None  Anesthesia: Topical with MAC  Indications: Painless, progressive loss of vision with compromise of daily activities.  Surgery: Cataract Extraction with Intraocular lens Implant Left Eye  Discription: The patient had dilating drops and viscous lidocaine placed into the left eye in the pre-op holding area. After transfer to the operating room, a time out was performed. The patient was then prepped and draped. Beginning with a 75 degree blade a paracentesis port was made at the surgeon's 2 o'clock position. The anterior chamber was then filled with 1% non-preserved lidocaine with epinepherine. This was followed by filling the anterior chamber with Provisc. A 2.73mm keratome blade was used to make a clear corneal incision at the temporal limbus. A bent cystatome needle was used to create a continuous tear capsulotomy. Hydrodissection was performed with balanced salt solution on a Fine canula. The lens nucleus was then removed using the phacoemulsification handpiece. Residual cortex was removed with the I&A handpiece. The anterior chamber and capsular bag were refilled with Provisc. A posterior chamber intraocular lens was placed into the capsular bag with it's injector. The implant was positioned with the Kuglan hook. The Provisc was then removed from the anterior chamber and capsular bag with the I&A handpiece. Stromal hydration of the main incision and paracentesis port was performed with BSS on a Fine canula. The wounds were tested for leak which was negative. The patient tolerated the procedure well. There were no operative complications. The patient was then transferred to the recovery room in stable condition.  Complications: None  Specimen: None  EBL:  None  Prosthetic device: B&L enVista, MX60, power 13.5D, SN [id number].

## 2013-11-20 NOTE — Preoperative (Signed)
Beta Blockers   Reason not to administer Beta Blockers:Not Applicable 

## 2013-11-20 NOTE — Anesthesia Preprocedure Evaluation (Addendum)
Anesthesia Evaluation  Patient identified by MRN, date of birth, ID band Patient awake    Reviewed: Allergy & Precautions, H&P , NPO status , Patient's Chart, lab work & pertinent test results  Airway Mallampati: II TM Distance: >3 FB     Dental  (+) Poor Dentition, Loose, Missing and Dental Advisory Given   Pulmonary COPDCurrent Smoker,  breath sounds clear to auscultation        Cardiovascular hypertension, Pt. on medications Rhythm:Regular Rate:Normal     Neuro/Psych  Headaches, PSYCHIATRIC DISORDERS (schizophrenia)    GI/Hepatic   Endo/Other  diabetes, Type 2, Oral Hypoglycemic Agents  Renal/GU      Musculoskeletal   Abdominal   Peds  Hematology   Anesthesia Other Findings   Reproductive/Obstetrics                          Anesthesia Physical Anesthesia Plan  ASA: III  Anesthesia Plan: MAC   Post-op Pain Management:    Induction: Intravenous  Airway Management Planned: Nasal Cannula  Additional Equipment:   Intra-op Plan:   Post-operative Plan:   Informed Consent: I have reviewed the patients History and Physical, chart, labs and discussed the procedure including the risks, benefits and alternatives for the proposed anesthesia with the patient or authorized representative who has indicated his/her understanding and acceptance.     Plan Discussed with:   Anesthesia Plan Comments:         Anesthesia Quick Evaluation

## 2013-11-21 ENCOUNTER — Other Ambulatory Visit: Payer: Self-pay | Admitting: Family Medicine

## 2013-11-24 ENCOUNTER — Encounter (HOSPITAL_COMMUNITY): Payer: Self-pay | Admitting: Ophthalmology

## 2013-12-01 ENCOUNTER — Encounter (HOSPITAL_COMMUNITY): Payer: Self-pay | Admitting: Pharmacy Technician

## 2013-12-02 MED ORDER — FENTANYL CITRATE 0.05 MG/ML IJ SOLN: 25.0000 ug | INTRAMUSCULAR | Status: DC | PRN

## 2013-12-02 MED ORDER — ONDANSETRON HCL 4 MG/2ML IJ SOLN: 4.0000 mg | Freq: Once | INTRAMUSCULAR | Status: AC | PRN

## 2013-12-03 ENCOUNTER — Encounter (HOSPITAL_COMMUNITY)
Admission: RE | Admit: 2013-12-03 | Discharge: 2013-12-03 | Disposition: A | Discharge status: Home / Self Care | Payer: Medicare Other | Source: Ambulatory Visit | Attending: Anesthesiology | Admitting: Anesthesiology

## 2013-12-03 ENCOUNTER — Encounter (HOSPITAL_COMMUNITY): Payer: Self-pay

## 2013-12-05 MED ORDER — TETRACAINE HCL 0.5 % OP SOLN
OPHTHALMIC | Status: AC
  Filled 2013-12-05: qty 2

## 2013-12-05 MED ORDER — NEOMYCIN-POLYMYXIN-DEXAMETH 3.5-10000-0.1 OP SUSP
OPHTHALMIC | Status: AC
  Filled 2013-12-05: qty 5

## 2013-12-05 MED ORDER — PHENYLEPHRINE HCL 2.5 % OP SOLN
OPHTHALMIC | Status: AC
  Filled 2013-12-05: qty 15

## 2013-12-05 MED ORDER — LIDOCAINE HCL (PF) 1 % IJ SOLN
INTRAMUSCULAR | Status: AC
  Filled 2013-12-05: qty 2

## 2013-12-05 MED ORDER — CYCLOPENTOLATE-PHENYLEPHRINE OP SOLN OPTIME - NO CHARGE
OPHTHALMIC | Status: AC
  Filled 2013-12-05: qty 2

## 2013-12-08 ENCOUNTER — Ambulatory Visit (HOSPITAL_COMMUNITY): Payer: Medicare Other | Admitting: Anesthesiology

## 2013-12-08 ENCOUNTER — Encounter (HOSPITAL_COMMUNITY): Payer: Self-pay | Admitting: *Deleted

## 2013-12-08 ENCOUNTER — Encounter (HOSPITAL_COMMUNITY)
Admission: RE | Disposition: A | Discharge status: Home / Self Care | Payer: Self-pay | Source: Ambulatory Visit | Attending: Ophthalmology

## 2013-12-08 ENCOUNTER — Encounter (HOSPITAL_COMMUNITY): Payer: Medicare Other | Admitting: Anesthesiology

## 2013-12-08 ENCOUNTER — Ambulatory Visit (HOSPITAL_COMMUNITY)
Admission: RE | Admit: 2013-12-08 | Discharge: 2013-12-08 | Disposition: A | Discharge status: Home / Self Care | Payer: Medicare Other | Source: Ambulatory Visit | Attending: Ophthalmology | Admitting: Ophthalmology

## 2013-12-08 DIAGNOSIS — Z7982 Long term (current) use of aspirin: Secondary | ICD-10-CM | POA: Insufficient documentation

## 2013-12-08 DIAGNOSIS — H2589 Other age-related cataract: Secondary | ICD-10-CM | POA: Insufficient documentation

## 2013-12-08 DIAGNOSIS — E119 Type 2 diabetes mellitus without complications: Secondary | ICD-10-CM | POA: Insufficient documentation

## 2013-12-08 DIAGNOSIS — Z01812 Encounter for preprocedural laboratory examination: Secondary | ICD-10-CM | POA: Insufficient documentation

## 2013-12-08 HISTORY — PX: CATARACT EXTRACTION W/PHACO: SHX586

## 2013-12-08 LAB — GLUCOSE, CAPILLARY
Glucose-Capillary: 64 mg/dL — ABNORMAL LOW (ref 70–99)
Glucose-Capillary: 74 mg/dL (ref 70–99)
Glucose-Capillary: 92 mg/dL (ref 70–99)

## 2013-12-08 SURGERY — PHACOEMULSIFICATION, CATARACT, WITH IOL INSERTION
Anesthesia: Monitor Anesthesia Care | Site: Eye | Laterality: Right

## 2013-12-08 MED ORDER — LACTATED RINGERS IV SOLN
INTRAVENOUS | Status: DC
  Administered 2013-12-08: 08:00:00 via INTRAVENOUS

## 2013-12-08 MED ORDER — CYCLOPENTOLATE-PHENYLEPHRINE 0.2-1 % OP SOLN
1.0000 [drp] | OPHTHALMIC | Status: AC
  Administered 2013-12-08 (×3): 1 [drp] via OPHTHALMIC

## 2013-12-08 MED ORDER — FENTANYL CITRATE 0.05 MG/ML IJ SOLN
25.0000 ug | INTRAMUSCULAR | Status: AC
  Administered 2013-12-08 (×2): 25 ug via INTRAVENOUS

## 2013-12-08 MED ORDER — LIDOCAINE HCL (PF) 1 % IJ SOLN
INTRAOCULAR | Status: DC | PRN
  Administered 2013-12-08: 09:00:00 via OPHTHALMIC

## 2013-12-08 MED ORDER — DEXTROSE 50 % IV SOLN
12.5000 g | Freq: Once | INTRAVENOUS | Status: AC
  Administered 2013-12-08: 12.5 g via INTRAVENOUS

## 2013-12-08 MED ORDER — FENTANYL CITRATE 0.05 MG/ML IJ SOLN: 25.0000 ug | INTRAMUSCULAR | Status: DC | PRN

## 2013-12-08 MED ORDER — BSS IO SOLN
INTRAOCULAR | Status: DC | PRN
  Administered 2013-12-08: 15 mL via INTRAOCULAR

## 2013-12-08 MED ORDER — FENTANYL CITRATE 0.05 MG/ML IJ SOLN
INTRAMUSCULAR | Status: AC
  Filled 2013-12-08: qty 2

## 2013-12-08 MED ORDER — PHENYLEPHRINE HCL 2.5 % OP SOLN
1.0000 [drp] | OPHTHALMIC | Status: AC
  Administered 2013-12-08 (×3): 1 [drp] via OPHTHALMIC

## 2013-12-08 MED ORDER — LIDOCAINE HCL 3.5 % OP GEL
1.0000 "application " | Freq: Once | OPHTHALMIC | Status: AC
  Administered 2013-12-08: 1 via OPHTHALMIC

## 2013-12-08 MED ORDER — EPINEPHRINE HCL 1 MG/ML IJ SOLN
INTRAMUSCULAR | Status: AC
  Filled 2013-12-08: qty 1

## 2013-12-08 MED ORDER — NEOMYCIN-POLYMYXIN-DEXAMETH 3.5-10000-0.1 OP SUSP
OPHTHALMIC | Status: DC | PRN
  Administered 2013-12-08: 2 [drp] via OPHTHALMIC

## 2013-12-08 MED ORDER — PROVISC 10 MG/ML IO SOLN
INTRAOCULAR | Status: DC | PRN
  Administered 2013-12-08: 0.85 mL via INTRAOCULAR

## 2013-12-08 MED ORDER — PHENYLEPHRINE HCL 2.5 % OP SOLN
OPHTHALMIC | Status: AC
  Filled 2013-12-08: qty 15

## 2013-12-08 MED ORDER — MIDAZOLAM HCL 2 MG/2ML IJ SOLN
INTRAMUSCULAR | Status: AC
  Filled 2013-12-08: qty 2

## 2013-12-08 MED ORDER — ONDANSETRON HCL 4 MG/2ML IJ SOLN: 4.0000 mg | Freq: Once | INTRAMUSCULAR | Status: DC | PRN

## 2013-12-08 MED ORDER — POVIDONE-IODINE 5 % OP SOLN
OPHTHALMIC | Status: DC | PRN
  Administered 2013-12-08: 1 via OPHTHALMIC

## 2013-12-08 MED ORDER — DEXTROSE 50 % IV SOLN
INTRAVENOUS | Status: AC
  Filled 2013-12-08: qty 50

## 2013-12-08 MED ORDER — EPINEPHRINE HCL 1 MG/ML IJ SOLN
INTRAOCULAR | Status: DC | PRN
  Administered 2013-12-08: 09:00:00

## 2013-12-08 MED ORDER — TETRACAINE HCL 0.5 % OP SOLN
1.0000 [drp] | OPHTHALMIC | Status: AC
  Administered 2013-12-08 (×3): 1 [drp] via OPHTHALMIC

## 2013-12-08 MED ORDER — MIDAZOLAM HCL 2 MG/2ML IJ SOLN: 1.0000 mg | INTRAMUSCULAR | Status: DC | PRN

## 2013-12-08 SURGICAL SUPPLY — 32 items
CAPSULAR TENSION RING-AMO (OPHTHALMIC RELATED) IMPLANT
CLOTH BEACON ORANGE TIMEOUT ST (SAFETY) ×2 IMPLANT
EYE SHIELD UNIVERSAL CLEAR (GAUZE/BANDAGES/DRESSINGS) ×2 IMPLANT
GLOVE BIO SURGEON STRL SZ 6.5 (GLOVE) IMPLANT
GLOVE BIOGEL PI IND STRL 6.5 (GLOVE) ×1 IMPLANT
GLOVE BIOGEL PI IND STRL 7.0 (GLOVE) ×1 IMPLANT
GLOVE BIOGEL PI IND STRL 7.5 (GLOVE) IMPLANT
GLOVE BIOGEL PI INDICATOR 6.5 (GLOVE) ×1
GLOVE BIOGEL PI INDICATOR 7.0 (GLOVE) ×1
GLOVE BIOGEL PI INDICATOR 7.5 (GLOVE)
GLOVE ECLIPSE 6.5 STRL STRAW (GLOVE) IMPLANT
GLOVE ECLIPSE 7.0 STRL STRAW (GLOVE) IMPLANT
GLOVE ECLIPSE 7.5 STRL STRAW (GLOVE) IMPLANT
GLOVE EXAM NITRILE LRG STRL (GLOVE) IMPLANT
GLOVE EXAM NITRILE MD LF STRL (GLOVE) IMPLANT
GLOVE SKINSENSE NS SZ6.5 (GLOVE)
GLOVE SKINSENSE NS SZ7.0 (GLOVE)
GLOVE SKINSENSE STRL SZ6.5 (GLOVE) IMPLANT
GLOVE SKINSENSE STRL SZ7.0 (GLOVE) IMPLANT
KIT VITRECTOMY (OPHTHALMIC RELATED) IMPLANT
PAD ARMBOARD 7.5X6 YLW CONV (MISCELLANEOUS) ×2 IMPLANT
PROC W NO LENS (INTRAOCULAR LENS)
PROC W SPEC LENS (INTRAOCULAR LENS)
PROCESS W NO LENS (INTRAOCULAR LENS) IMPLANT
PROCESS W SPEC LENS (INTRAOCULAR LENS) IMPLANT
RING MALYGIN (MISCELLANEOUS) IMPLANT
SIGHTPATH CAT PROC W REG LENS (Ophthalmic Related) ×2 IMPLANT
SYR TB 1ML LL NO SAFETY (SYRINGE) ×2 IMPLANT
TAPE SURG TRANSPORE 1 IN (GAUZE/BANDAGES/DRESSINGS) ×1 IMPLANT
TAPE SURGICAL TRANSPORE 1 IN (GAUZE/BANDAGES/DRESSINGS) ×1
VISCOELASTIC ADDITIONAL (OPHTHALMIC RELATED) IMPLANT
WATER STERILE IRR 250ML POUR (IV SOLUTION) ×2 IMPLANT

## 2013-12-08 NOTE — Anesthesia Preprocedure Evaluation (Addendum)
Anesthesia Evaluation  Patient identified by MRN, date of birth, ID band Patient awake    Reviewed: Allergy & Precautions, H&P , NPO status , Patient's Chart, lab work & pertinent test results  Airway Mallampati: II TM Distance: >3 FB     Dental  (+) Poor Dentition, Loose, Missing and Dental Advisory Given   Pulmonary COPDCurrent Smoker,  breath sounds clear to auscultation        Cardiovascular hypertension, Pt. on medications Rhythm:Regular Rate:Normal     Neuro/Psych  Headaches, PSYCHIATRIC DISORDERS (schizophrenia)    GI/Hepatic   Endo/Other  diabetes, Type 2, Oral Hypoglycemic Agents  Renal/GU      Musculoskeletal   Abdominal   Peds  Hematology   Anesthesia Other Findings   Reproductive/Obstetrics                          Anesthesia Physical Anesthesia Plan  ASA: III  Anesthesia Plan: MAC   Post-op Pain Management:    Induction: Intravenous  Airway Management Planned: Nasal Cannula  Additional Equipment:   Intra-op Plan:   Post-operative Plan:   Informed Consent: I have reviewed the patients History and Physical, chart, labs and discussed the procedure including the risks, benefits and alternatives for the proposed anesthesia with the patient or authorized representative who has indicated his/her understanding and acceptance.     Plan Discussed with:   Anesthesia Plan Comments: (1/2 amp D50W in preop  for cbg=70)       Anesthesia Quick Evaluation

## 2013-12-08 NOTE — Anesthesia Postprocedure Evaluation (Signed)
  Anesthesia Post-op Note  Patient: Matthew Molina  Procedure(s) Performed: Procedure(s) (LRB): RIGHT EYE CATARACT EXTRACTION PHACO AND INTRAOCULAR LENS PLACEMENT  (Right)  Patient Location:  Short Stay  Anesthesia Type: MAC  Level of Consciousness: awake  Airway and Oxygen Therapy: Patient Spontanous Breathing  Post-op Pain: none  Post-op Assessment: Post-op Vital signs reviewed, Patient's Cardiovascular Status Stable, Respiratory Function Stable, Patent Airway, No signs of Nausea or vomiting and Pain level controlled  Post-op Vital Signs: Reviewed and stable  Complications: No apparent anesthesia complications

## 2013-12-08 NOTE — Transfer of Care (Signed)
Immediate Anesthesia Transfer of Care Note  Patient: Matthew Molina  Procedure(s) Performed: Procedure(s) (LRB): RIGHT EYE CATARACT EXTRACTION PHACO AND INTRAOCULAR LENS PLACEMENT  (Right)  Patient Location: Shortstay  Anesthesia Type: MAC  Level of Consciousness: awake  Airway & Oxygen Therapy: Patient Spontanous Breathing   Post-op Assessment: Report given to PACU RN, Post -op Vital signs reviewed and stable and Patient moving all extremities  Post vital signs: Reviewed and stable  Complications: No apparent anesthesia complications

## 2013-12-08 NOTE — Progress Notes (Signed)
Additional set of drops given per MD order.

## 2013-12-08 NOTE — Anesthesia Procedure Notes (Signed)
Procedure Name: MAC Date/Time: 12/08/2013 8:32 AM Performed by: Franco Nones Pre-anesthesia Checklist: Patient identified, Emergency Drugs available, Suction available, Timeout performed and Patient being monitored Patient Re-evaluated:Patient Re-evaluated prior to inductionOxygen Delivery Method: Nasal Cannula

## 2013-12-08 NOTE — H&P (Signed)
I have reviewed the H&P, the patient was re-examined, and I have identified no interval changes in medical condition and plan of care since the history and physical of record  

## 2013-12-08 NOTE — Op Note (Signed)
Date of Admission: 12/08/2013  Date of Surgery: 12/08/2013  Pre-Op Dx: Cataract  Right  Eye  Post-Op Dx: Cataract  Right  Eye,  Dx Code 366.19  Surgeon: Gemma Payor, M.D.  Assistants: None  Anesthesia: Topical with MAC  Indications: Painless, progressive loss of vision with compromise of daily activities.  Surgery: Cataract Extraction with Intraocular lens Implant Right Eye  Discription: The patient had dilating drops and viscous lidocaine placed into the right eye in the pre-op holding area. After transfer to the operating room, a time out was performed. The patient was then prepped and draped. Beginning with a 75 degree blade a paracentesis port was made at the surgeon's 2 o'clock position. The anterior chamber was then filled with 1% non-preserved lidocaine. This was followed by filling the anterior chamber with Provisc.  A 2.10mm keratome blade was used to make a clear corneal incision at the temporal limbus.  A bent cystatome needle was used to create a continuous tear capsulotomy. Hydrodissection was performed with balanced salt solution on a Fine canula. The lens nucleus was then removed using the phacoemulsification handpiece. Residual cortex was removed with the I&A handpiece. The anterior chamber and capsular bag were refilled with Provisc. A posterior chamber intraocular lens was placed into the capsular bag with it's injector. The implant was positioned with the Kuglan hook. The Provisc was then removed from the anterior chamber and capsular bag with the I&A handpiece. Stromal hydration of the main incision and paracentesis port was performed with BSS on a Fine canula. The wounds were tested for leak which was negative. The patient tolerated the procedure well. There were no operative complications. The patient was then transferred to the recovery room in stable condition.  Complications: None  Specimen: None  EBL: None  Prosthetic device: B&L enVista, MX60, power 15.5D, SN  [id number].

## 2013-12-09 ENCOUNTER — Encounter (HOSPITAL_COMMUNITY): Payer: Self-pay | Admitting: Ophthalmology

## 2013-12-19 ENCOUNTER — Other Ambulatory Visit: Payer: Self-pay | Admitting: Family Medicine

## 2013-12-31 ENCOUNTER — Telehealth: Payer: Self-pay | Admitting: Family Medicine

## 2013-12-31 NOTE — Telephone Encounter (Signed)
Pls contact pt, notification is that he is not taking any of his mental health meds , from [facility], I will leave the notice, he needs to resume the meds.. Letter states he was c/o loneliness and suggesting an SSRI, but per mental health he is supposed to be on originally prescribed meds for years , he has a dx of schizophrenia I will lv the [facility] letter with you for reference

## 2014-01-02 NOTE — Telephone Encounter (Signed)
Spoke with patient and he states that he did for a short time cut buspar back to 1/2 tablet but has been taking risperidone as prescribed.  He is now taking meds as prescribed.

## 2014-01-12 ENCOUNTER — Ambulatory Visit (INDEPENDENT_AMBULATORY_CARE_PROVIDER_SITE_OTHER): Payer: Medicare Other | Admitting: Family Medicine

## 2014-01-12 ENCOUNTER — Encounter: Payer: Self-pay | Admitting: Family Medicine

## 2014-01-12 ENCOUNTER — Encounter (INDEPENDENT_AMBULATORY_CARE_PROVIDER_SITE_OTHER): Payer: Self-pay

## 2014-01-12 VITALS — BP 118/80 | HR 98 | Resp 16 | Ht 73.5 in | Wt 257.0 lb

## 2014-01-12 DIAGNOSIS — E669 Obesity, unspecified: Secondary | ICD-10-CM

## 2014-01-12 DIAGNOSIS — Z125 Encounter for screening for malignant neoplasm of prostate: Secondary | ICD-10-CM

## 2014-01-12 DIAGNOSIS — E119 Type 2 diabetes mellitus without complications: Secondary | ICD-10-CM

## 2014-01-12 DIAGNOSIS — I10 Essential (primary) hypertension: Secondary | ICD-10-CM

## 2014-01-12 DIAGNOSIS — F17200 Nicotine dependence, unspecified, uncomplicated: Secondary | ICD-10-CM

## 2014-01-12 DIAGNOSIS — J449 Chronic obstructive pulmonary disease, unspecified: Secondary | ICD-10-CM

## 2014-01-12 DIAGNOSIS — J4489 Other specified chronic obstructive pulmonary disease: Secondary | ICD-10-CM

## 2014-01-12 DIAGNOSIS — F209 Schizophrenia, unspecified: Secondary | ICD-10-CM

## 2014-01-12 DIAGNOSIS — E1169 Type 2 diabetes mellitus with other specified complication: Secondary | ICD-10-CM

## 2014-01-12 DIAGNOSIS — E785 Hyperlipidemia, unspecified: Secondary | ICD-10-CM

## 2014-01-12 LAB — COMPLETE METABOLIC PANEL WITH GFR
ALT: 25 U/L (ref 0–53)
AST: 19 U/L (ref 0–37)
Albumin: 4.4 g/dL (ref 3.5–5.2)
Alkaline Phosphatase: 44 U/L (ref 39–117)
BUN: 13 mg/dL (ref 6–23)
CO2: 26 mEq/L (ref 19–32)
Calcium: 10 mg/dL (ref 8.4–10.5)
Chloride: 101 mEq/L (ref 96–112)
Creat: 1.26 mg/dL (ref 0.50–1.35)
GFR, Est African American: 69 mL/min
GFR, Est Non African American: 60 mL/min
Glucose, Bld: 78 mg/dL (ref 70–99)
Potassium: 4 mEq/L (ref 3.5–5.3)
Sodium: 135 mEq/L (ref 135–145)
Total Bilirubin: 0.4 mg/dL (ref 0.3–1.2)
Total Protein: 7.6 g/dL (ref 6.0–8.3)

## 2014-01-12 MED ORDER — VALSARTAN 320 MG PO TABS: ORAL_TABLET | ORAL | Status: DC

## 2014-01-12 MED ORDER — TRIAMTERENE-HCTZ 37.5-25 MG PO TABS: ORAL_TABLET | ORAL | Status: DC

## 2014-01-12 MED ORDER — BUDESONIDE-FORMOTEROL FUMARATE 80-4.5 MCG/ACT IN AERO: 2.0000 | INHALATION_SPRAY | Freq: Two times a day (BID) | RESPIRATORY_TRACT | Status: DC

## 2014-01-12 MED ORDER — AMLODIPINE BESYLATE 10 MG PO TABS: ORAL_TABLET | ORAL | Status: DC

## 2014-01-12 MED ORDER — RISPERIDONE 4 MG PO TABS: ORAL_TABLET | ORAL | Status: DC

## 2014-01-12 MED ORDER — GLIPIZIDE ER 10 MG PO TB24: ORAL_TABLET | ORAL | Status: DC

## 2014-01-12 MED ORDER — ALBUTEROL SULFATE HFA 108 (90 BASE) MCG/ACT IN AERS: 2.0000 | INHALATION_SPRAY | Freq: Four times a day (QID) | RESPIRATORY_TRACT | Status: DC | PRN

## 2014-01-12 NOTE — Patient Instructions (Addendum)
F/u in 4 months, with rectal and foot exam call if you need me before  HBa1C chem 7 and EGFR today and PSA today  Fasting lipid, cmp and EGFR, hBA1C,   in 4.5 month  You are referred to psychiatry  For breathing , cut back by one cigarette each month, and also use spiriva and symbicort every day as prescribed,you DO NEED TO STOP SMOKING  New additional inhaler proventil for use if wheezing

## 2014-01-13 LAB — HEMOGLOBIN A1C
Hgb A1c MFr Bld: 6.2 % — ABNORMAL HIGH (ref <5.7)
Mean Plasma Glucose: 131 mg/dL — ABNORMAL HIGH (ref <117)

## 2014-01-13 LAB — PSA, MEDICARE: PSA: 2.54 ng/mL (ref <=4.00)

## 2014-01-19 ENCOUNTER — Encounter: Payer: Self-pay | Admitting: Family Medicine

## 2014-01-19 NOTE — Assessment & Plan Note (Signed)
Deteriorated. Patient re-educated about  the importance of commitment to a  minimum of 150 minutes of exercise per week. The importance of healthy food choices with portion control discussed. Encouraged to start a food diary, count calories and to consider  joining a support group. Sample diet sheets offered. Goals set by the patient for the next several months.    

## 2014-01-19 NOTE — Assessment & Plan Note (Signed)
Controlled, no change in medication DASH diet and commitment to daily physical activity for a minimum of 30 minutes discussed and encouraged, as a part of hypertension management. The importance of attaining a healthy weight is also discussed.  

## 2014-01-19 NOTE — Assessment & Plan Note (Signed)
Progressively worsening due to continued nicotine use Proventil for as needed use.

## 2014-01-19 NOTE — Assessment & Plan Note (Signed)
Controlled, no change in medication Patient advised to reduce carb and sweets, commit to regular physical activity, take meds as prescribed, test blood as directed, and attempt to lose weight, to improve blood sugar control.  

## 2014-01-19 NOTE — Assessment & Plan Note (Signed)
Unchanged. Patient counseled for approximately 5 minutes regarding the health risks of ongoing nicotine use, specifically all types of cancer, heart disease, stroke and respiratory failure. The options available for help with cessation ,the behavioral changes to assist the process, and the option to either gradully reduce usage  Or abruptly stop.is also discussed. Pt is also encouraged to set specific goals in number of cigarettes used daily, as well as to set a quit date.  

## 2014-01-19 NOTE — Assessment & Plan Note (Signed)
Uncontrolled. Hyperlipidemia:Low fat diet discussed and encouraged.  Updated lab needed at/ before next visit.  

## 2014-01-19 NOTE — Progress Notes (Signed)
   Subjective:    Patient ID: Matthew Molina, male    DOB: 07-30-1951, 63 y.o.   MRN: [id number]  HPI The PT is here for follow up and re-evaluation of chronic medical conditions, medication management and review of any available recent lab and radiology data.  Preventive health is updated, specifically  Cancer screening and Immunization.   Had successful left cataract extraction in 11/2013 The PT denies any adverse reactions to current medications since the last visit.  Requests psych eval due to increased auditory hallucinations and depression, denies suicidal or homicidal ideation Denies polyuria, polydipsia , or hypoglycemic episodes    Review of Systems See HPI Denies recent fever or chills. Denies sinus pressure, nasal congestion, ear pain or sore throat. Denies chest congestion, productive cough or wheezing. Denies chest pains, palpitations and leg swelling Denies abdominal pain, nausea, vomiting,diarrhea or constipation.   Denies dysuria, frequency, hesitancy or incontinence. Chronic , unchanged joint pain,  and limitation in mobility. Denies headaches, seizures, numbness, or tingling.  Denies skin break down or rash.        Objective:   Physical Exam Patient alert and oriented and in no cardiopulmonary distress.  HEENT: No facial asymmetry, EOMI, no sinus tenderness,  oropharynx pink and moist.  Neck decreased though adequate ROM, no JVd no adenopathy.  Chest: Clear to auscultation bilaterally.Decreased air entry   CVS: S1, S2 no murmurs, no S3.  ABD: Soft non tender. Bowel sounds normal.  Ext: No edema  MS: decreased  ROM spine, adequate in shoulders, hips and knees.  Skin: Intact, no ulcerations or rash noted.  Psych: Good eye contact, flat  affect. Memory intact not anxious or depressed appearing.  CNS: CN 2-12 intact, power,  normal throughout.        Assessment & Plan:

## 2014-01-19 NOTE — Assessment & Plan Note (Signed)
deterioratiuon in symptoms. C/o auditory symptoms and depression, not suicidal or homicidal refer to psych for management

## 2014-01-21 ENCOUNTER — Telehealth: Payer: Self-pay | Admitting: Family Medicine

## 2014-01-22 ENCOUNTER — Other Ambulatory Visit: Payer: Self-pay | Admitting: Family Medicine

## 2014-01-22 NOTE — Telephone Encounter (Signed)
Office notes faxed 

## 2014-02-03 ENCOUNTER — Other Ambulatory Visit: Payer: Self-pay | Admitting: Family Medicine

## 2014-02-18 ENCOUNTER — Other Ambulatory Visit: Payer: Self-pay | Admitting: Family Medicine

## 2014-03-25 ENCOUNTER — Telehealth: Payer: Self-pay

## 2014-03-25 NOTE — Telephone Encounter (Signed)
Patient aware that form completed and returned.

## 2014-03-26 ENCOUNTER — Other Ambulatory Visit: Payer: Self-pay | Admitting: Family Medicine

## 2014-05-12 ENCOUNTER — Encounter: Payer: Medicare Other | Admitting: Family Medicine

## 2014-05-21 ENCOUNTER — Other Ambulatory Visit: Payer: Self-pay | Admitting: Family Medicine

## 2014-06-15 ENCOUNTER — Other Ambulatory Visit: Payer: Self-pay | Admitting: Family Medicine

## 2014-06-16 ENCOUNTER — Ambulatory Visit (INDEPENDENT_AMBULATORY_CARE_PROVIDER_SITE_OTHER): Payer: Medicare Other | Admitting: Family Medicine

## 2014-06-16 ENCOUNTER — Encounter: Payer: Self-pay | Admitting: Family Medicine

## 2014-06-16 VITALS — BP 120/74 | HR 86 | Resp 16 | Ht 73.5 in | Wt 248.1 lb

## 2014-06-16 DIAGNOSIS — Z1211 Encounter for screening for malignant neoplasm of colon: Secondary | ICD-10-CM

## 2014-06-16 DIAGNOSIS — J42 Unspecified chronic bronchitis: Secondary | ICD-10-CM

## 2014-06-16 DIAGNOSIS — E669 Obesity, unspecified: Secondary | ICD-10-CM

## 2014-06-16 DIAGNOSIS — F17209 Nicotine dependence, unspecified, with unspecified nicotine-induced disorders: Secondary | ICD-10-CM

## 2014-06-16 DIAGNOSIS — I10 Essential (primary) hypertension: Secondary | ICD-10-CM

## 2014-06-16 DIAGNOSIS — F1999 Other psychoactive substance use, unspecified with unspecified psychoactive substance-induced disorder: Secondary | ICD-10-CM

## 2014-06-16 DIAGNOSIS — F209 Schizophrenia, unspecified: Secondary | ICD-10-CM

## 2014-06-16 DIAGNOSIS — B351 Tinea unguium: Secondary | ICD-10-CM

## 2014-06-16 DIAGNOSIS — E1169 Type 2 diabetes mellitus with other specified complication: Secondary | ICD-10-CM

## 2014-06-16 DIAGNOSIS — E119 Type 2 diabetes mellitus without complications: Secondary | ICD-10-CM

## 2014-06-16 DIAGNOSIS — F17200 Nicotine dependence, unspecified, uncomplicated: Secondary | ICD-10-CM

## 2014-06-16 DIAGNOSIS — E785 Hyperlipidemia, unspecified: Secondary | ICD-10-CM

## 2014-06-16 DIAGNOSIS — Z23 Encounter for immunization: Secondary | ICD-10-CM

## 2014-06-16 NOTE — Patient Instructions (Addendum)
Smoking Cessation, Tips for Success If you are ready to quit smoking, congratulations! You have chosen to help yourself be healthier. Cigarettes bring nicotine, tar, carbon monoxide, and other irritants into your body. Your lungs, heart, and blood vessels will be able to work better without these poisons. There are many different ways to quit smoking. Nicotine gum, nicotine patches, a nicotine inhaler, or nicotine nasal spray can help with physical craving. Hypnosis, support groups, and medicines help break the habit of smoking. WHAT THINGS CAN I DO TO MAKE QUITTING EASIER?  Here are some tips to help you quit for good:  Pick a date when you will quit smoking completely. Tell all of your friends and family about your plan to quit on that date.  Do not try to slowly cut down on the number of cigarettes you are smoking. Pick a quit date and quit smoking completely starting on that day.  Throw away all cigarettes.   Clean and remove all ashtrays from your home, work, and car.   On a card, write down your reasons for quitting. Carry the card with you and read it when you get the urge to smoke.   Cleanse your body of nicotine. Drink enough water and fluids to keep your urine clear or pale yellow. Do this after quitting to flush the nicotine from your body.   Learn to predict your moods. Do not let a bad situation be your excuse to have a cigarette. Some situations in your life might tempt you into wanting a cigarette.   Never have "just one" cigarette. It leads to wanting another and another. Remind yourself of your decision to quit.   Change habits associated with smoking. If you smoked while driving or when feeling stressed, try other activities to replace smoking. Stand up when drinking your coffee. Brush your teeth after eating. Sit in a different chair when you read the paper. Avoid alcohol while trying to quit, and try to drink fewer caffeinated beverages. Alcohol and caffeine may urge  you to smoke.   Avoid foods and drinks that can trigger a desire to smoke, such as sugary or spicy foods and alcohol.   Ask people who smoke not to smoke around you.   Have something planned to do right after eating or having a cup of coffee. For example, plan to take a walk or exercise.   Try a relaxation exercise to calm you down and decrease your stress. Remember, you may be tense and nervous for the first 2 weeks after you quit, but this will pass.   Find new activities to keep your hands busy. Play with a pen, coin, or rubber band. Doodle or draw things on paper.   Brush your teeth right after eating. This will help cut down on the craving for the taste of tobacco after meals. You can also try mouthwash.   Use oral substitutes in place of cigarettes. Try using lemon drops, carrots, cinnamon sticks, or chewing gum. Keep them handy so they are available when you have the urge to smoke.   When you have the urge to smoke, try deep breathing.   Designate your home as a nonsmoking area.   If you are a heavy smoker, ask your health care provider about a prescription for nicotine chewing gum. It can ease your withdrawal from nicotine.   Reward yourself. Set aside the cigarette money you save and buy yourself something nice.   Look for support from others. Join a support group or   smoking cessation program. Ask someone at home or at work to help you with your plan to quit smoking.   Always ask yourself, "Do I need this cigarette or is this just a reflex?" Tell yourself, "Today, I choose not to smoke," or "I do not want to smoke." You are reminding yourself of your decision to quit.  Do not replace cigarette smoking with electronic cigarettes (commonly called e-cigarettes). The safety of e-cigarettes is unknown, and some may contain harmful chemicals.  If you relapse, do not give up! Plan ahead and think about what you will do the next time you get the urge to smoke.  HOW WILL  I FEEL WHEN I QUIT SMOKING? You may have symptoms of withdrawal because your body is used to nicotine (the addictive substance in cigarettes). You may crave cigarettes, be irritable, feel very hungry, cough often, get headaches, or have difficulty concentrating. The withdrawal symptoms are only temporary. They are strongest when you first quit but will go away within 10-14 days. When withdrawal symptoms occur, stay in control. Think about your reasons for quitting. Remind yourself that these are signs that your body is healing and getting used to being without cigarettes. Remember that withdrawal symptoms are easier to treat than the major diseases that smoking can cause.  Even after the withdrawal is over, expect periodic urges to smoke. However, these cravings are generally short lived and will go away whether you smoke or not. Do not smoke!  WHAT RESOURCES ARE AVAILABLE TO HELP ME QUIT SMOKING? Your health care provider can direct you to community resources or hospitals for support, which may include:  Group support.  Education.  Hypnosis.  Therapy. Document Released: 09/01/2004 Document Revised: 09/24/2013 Document Reviewed: 05/22/2013 [facility] Patient Information 2015 [place], [place]. This information is not intended to replace advice given to you by your health care provider. Make sure you discuss any questions you have with your health care provider. , call if you nee me befor  Please work on smoking cessation, you need to quit to reduce your risk of cancer, heart disease and stroke, also lung damamge  Prevnar today  Rectal today   Labs today  You are referred to podiatrist and also for a chest scan

## 2014-06-17 ENCOUNTER — Telehealth: Payer: Self-pay

## 2014-06-17 LAB — COMPLETE METABOLIC PANEL WITH GFR
ALT: 26 U/L (ref 0–53)
AST: 20 U/L (ref 0–37)
Albumin: 4.3 g/dL (ref 3.5–5.2)
Alkaline Phosphatase: 44 U/L (ref 39–117)
BUN: 13 mg/dL (ref 6–23)
CO2: 23 mEq/L (ref 19–32)
Calcium: 9.7 mg/dL (ref 8.4–10.5)
Chloride: 104 mEq/L (ref 96–112)
Creat: 1.26 mg/dL (ref 0.50–1.35)
GFR, Est African American: 69 mL/min
GFR, Est Non African American: 60 mL/min
Glucose, Bld: 92 mg/dL (ref 70–99)
Potassium: 3.7 mEq/L (ref 3.5–5.3)
Sodium: 137 mEq/L (ref 135–145)
Total Bilirubin: 0.4 mg/dL (ref 0.2–1.2)
Total Protein: 7.5 g/dL (ref 6.0–8.3)

## 2014-06-17 LAB — LIPID PANEL
Cholesterol: 132 mg/dL (ref 0–200)
HDL: 33 mg/dL — ABNORMAL LOW (ref >39)
LDL Cholesterol: 52 mg/dL (ref 0–99)
Total CHOL/HDL Ratio: 4 Ratio
Triglycerides: 237 mg/dL — ABNORMAL HIGH (ref <150)
VLDL: 47 mg/dL — ABNORMAL HIGH (ref 0–40)

## 2014-06-17 LAB — HEMOGLOBIN A1C
Hgb A1c MFr Bld: 6 % — ABNORMAL HIGH (ref <5.7)
Mean Plasma Glucose: 126 mg/dL — ABNORMAL HIGH (ref <117)

## 2014-06-17 LAB — HEMOCCULT GUIAC POC 1CARD (OFFICE): Fecal Occult Blood, POC: NEGATIVE

## 2014-06-17 NOTE — Telephone Encounter (Signed)
Will fax as soon as note is completed

## 2014-06-22 ENCOUNTER — Other Ambulatory Visit: Payer: Self-pay | Admitting: Family Medicine

## 2014-06-26 ENCOUNTER — Other Ambulatory Visit: Payer: Self-pay

## 2014-06-26 DIAGNOSIS — E785 Hyperlipidemia, unspecified: Secondary | ICD-10-CM

## 2014-06-26 MED ORDER — EZETIMIBE 10 MG PO TABS: 10.0000 mg | ORAL_TABLET | Freq: Every day | ORAL | Status: DC

## 2014-07-06 ENCOUNTER — Other Ambulatory Visit: Payer: Self-pay

## 2014-07-06 DIAGNOSIS — F17200 Nicotine dependence, unspecified, uncomplicated: Secondary | ICD-10-CM

## 2014-07-06 DIAGNOSIS — J42 Unspecified chronic bronchitis: Secondary | ICD-10-CM

## 2014-07-14 ENCOUNTER — Ambulatory Visit (HOSPITAL_COMMUNITY)
Admission: RE | Admit: 2014-07-14 | Discharge: 2014-07-14 | Disposition: A | Discharge status: Home / Self Care | Payer: Medicare Other | Source: Ambulatory Visit | Attending: Family Medicine | Admitting: Family Medicine

## 2014-07-14 DIAGNOSIS — J90 Pleural effusion, not elsewhere classified: Secondary | ICD-10-CM | POA: Diagnosis not present

## 2014-07-14 DIAGNOSIS — Z23 Encounter for immunization: Secondary | ICD-10-CM | POA: Insufficient documentation

## 2014-07-14 DIAGNOSIS — K449 Diaphragmatic hernia without obstruction or gangrene: Secondary | ICD-10-CM | POA: Insufficient documentation

## 2014-07-14 DIAGNOSIS — I7090 Unspecified atherosclerosis: Secondary | ICD-10-CM | POA: Insufficient documentation

## 2014-07-14 DIAGNOSIS — F17200 Nicotine dependence, unspecified, uncomplicated: Secondary | ICD-10-CM | POA: Insufficient documentation

## 2014-07-14 DIAGNOSIS — J42 Unspecified chronic bronchitis: Secondary | ICD-10-CM | POA: Diagnosis not present

## 2014-07-14 NOTE — Assessment & Plan Note (Signed)
Vaccine administered at visit.  

## 2014-07-14 NOTE — Assessment & Plan Note (Signed)
Unchanged, no plan to quit currently Patient counseled for approximately 5 minutes regarding the health risks of ongoing nicotine use, specifically all types of cancer, heart disease, stroke and respiratory failure. The options available for help with cessation ,the behavioral changes to assist the process, and the option to either gradully reduce usage  Or abruptly stop.is also discussed. Pt is also encouraged to set specific goals in number of cigarettes used daily, as well as to set a quit date.

## 2014-07-14 NOTE — Assessment & Plan Note (Signed)
Continues to worsen due to ongoing nicotine use, cessation counseling done

## 2014-07-14 NOTE — Assessment & Plan Note (Signed)
Controlled, no change in medication DASH diet and commitment to daily physical activity for a minimum of 30 minutes discussed and encouraged, as a part of hypertension management. The importance of attaining a healthy weight is also discussed.  

## 2014-07-14 NOTE — Assessment & Plan Note (Signed)
Controlled, no change in medication Patient educated about the importance of limiting  Carbohydrate intake , the need to commit to daily physical activity for a minimum of 30 minutes , and to commit weight loss. The fact that changes in all these areas will reduce or eliminate all together the development of diabetes is stressed.    

## 2014-07-14 NOTE — Assessment & Plan Note (Signed)
Pt needs foot care and is diabetic, referral to podiatry entered

## 2014-07-14 NOTE — Assessment & Plan Note (Signed)
Improved. Pt applauded on succesful weight loss through lifestyle change, and encouraged to continue same. Weight loss goal set for the next several months.  

## 2014-07-14 NOTE — Progress Notes (Signed)
   Subjective:    Patient ID: Matthew Molina, male    DOB: 01-22-1951, 63 y.o.   MRN: [id number]  HPI The PT is here for follow up and re-evaluation of chronic medical conditions, medication management and review of any available recent lab and radiology data.  Preventive health is updated, specifically  Cancer screening and Immunization.   . The PT denies any adverse reactions to current medications since the last visit.  .  C/o pain in the legs with ambulation, and overall generalized fatigue with activity in the past 1 month Denies polyuria , polydipsia, hypoglycemic episodes, does not test blood sugar regulalrly    Review of Systems See HPI Denies recent fever or chills. Denies sinus pressure, nasal congestion, ear pain or sore throat. Denies chest congestion, productive cough or wheezing. Denies chest pains, palpitations and leg swelling Denies abdominal pain, nausea, vomiting,diarrhea or constipation.   Denies dysuria, frequency, hesitancy or incontinence. Chronic back pain and some limitation in mobility. Denies headaches, seizures, numbness, or tingling. Denies uncontrolled  depression, anxiety or insomnia. Denies skin break down or rash.        Objective:   Physical Exam BP 120/74  Pulse 86  Resp 16  Ht 6' 1.5" (1.867 m)  Wt 248 lb 1.9 oz (112.546 kg)  BMI 32.29 kg/m2  SpO2 96%. Patient alert and oriented and in no cardiopulmonary distress.  HEENT: No facial asymmetry, EOMI,   oropharynx pink and moist.  Neck adequate ROM, no JVD, no mass.  Chest: Clear to auscultation bilaterally.Decreased air entry throughout  CVS: S1, S2 no murmurs, no S3.Regular rate.  ABD: Soft non tender.   Ext: No edema  MS: decreased  ROM spine, shoulders, hips and knees.  Skin: Intact, no ulcerations or rash noted.  Psych: Good eye contact, blunted  affect. Memory intact not anxious or depressed appearing.  CNS: CN 2-12 intact, power,  normal throughout.no focal deficits  noted.        Assessment & Plan:

## 2014-07-14 NOTE — Assessment & Plan Note (Signed)
Controlled , treated by psychiatry 

## 2014-07-15 ENCOUNTER — Other Ambulatory Visit: Payer: Self-pay | Admitting: Family Medicine

## 2014-07-15 DIAGNOSIS — F17218 Nicotine dependence, cigarettes, with other nicotine-induced disorders: Secondary | ICD-10-CM

## 2014-07-15 DIAGNOSIS — F17209 Nicotine dependence, unspecified, with unspecified nicotine-induced disorders: Secondary | ICD-10-CM

## 2014-07-15 DIAGNOSIS — N289 Disorder of kidney and ureter, unspecified: Secondary | ICD-10-CM

## 2014-07-15 DIAGNOSIS — E119 Type 2 diabetes mellitus without complications: Secondary | ICD-10-CM

## 2014-07-15 DIAGNOSIS — R911 Solitary pulmonary nodule: Secondary | ICD-10-CM

## 2014-07-15 DIAGNOSIS — R9389 Abnormal findings on diagnostic imaging of other specified body structures: Secondary | ICD-10-CM

## 2014-07-15 DIAGNOSIS — I2510 Atherosclerotic heart disease of native coronary artery without angina pectoris: Secondary | ICD-10-CM

## 2014-07-15 DIAGNOSIS — J90 Pleural effusion, not elsewhere classified: Secondary | ICD-10-CM

## 2014-07-17 ENCOUNTER — Telehealth: Payer: Self-pay | Admitting: Family Medicine

## 2014-07-17 NOTE — Telephone Encounter (Signed)
Please advise 

## 2014-07-17 NOTE — Telephone Encounter (Signed)
I think that is OK , HOWEVER ok to offer [place] appt if this is sooner and he is willing to go there to [facility]

## 2014-07-20 ENCOUNTER — Ambulatory Visit (HOSPITAL_COMMUNITY)
Admission: RE | Admit: 2014-07-20 | Discharge: 2014-07-20 | Disposition: A | Discharge status: Home / Self Care | Payer: Medicare Other | Source: Ambulatory Visit | Attending: Family Medicine | Admitting: Family Medicine

## 2014-07-20 DIAGNOSIS — Q618 Other cystic kidney diseases: Secondary | ICD-10-CM | POA: Insufficient documentation

## 2014-07-20 DIAGNOSIS — N289 Disorder of kidney and ureter, unspecified: Secondary | ICD-10-CM

## 2014-07-24 ENCOUNTER — Ambulatory Visit (INDEPENDENT_AMBULATORY_CARE_PROVIDER_SITE_OTHER): Payer: Medicare Other | Admitting: Internal Medicine

## 2014-07-24 ENCOUNTER — Encounter: Payer: Self-pay | Admitting: Internal Medicine

## 2014-07-24 ENCOUNTER — Encounter: Payer: Self-pay | Admitting: *Deleted

## 2014-07-24 VITALS — BP 116/58 | HR 100 | Ht 73.0 in | Wt 251.0 lb

## 2014-07-24 DIAGNOSIS — R0602 Shortness of breath: Secondary | ICD-10-CM

## 2014-07-24 NOTE — Progress Notes (Signed)
HPI Patient is a 63 yo who is referred for cardiac evaluation  He is followed by Dr Moshe Cipro He has no known history of CAD  Had a remote myoview in early 2000s Episode of CP a long time ago  Went to ER  Told it was not his heart  (R Upper chest) Has not had any since He denies CP  Breathing is OK  Doesn't exercise or walk much  Slowing down Glucose 120s t o140s   Smokes 1 ppd since 18 EKG 11/2013:  NSR   Allergies  Allergen Reactions  . Sertraline Hcl     Stomach upset/pain    Current Outpatient Prescriptions  Medication Sig Dispense Refill  . ACCU-CHEK AVIVA PLUS test strip TEST BLOOD SUGAR ONCE DAILY AS DIRECTED.  50 each  0  . albuterol (PROVENTIL HFA;VENTOLIN HFA) 108 (90 BASE) MCG/ACT inhaler Inhale 2 puffs into the lungs every 6 (six) hours as needed for wheezing or shortness of breath.  1 Inhaler  2  . amLODipine (NORVASC) 10 MG tablet TAKE (1) TABLET BY MOUTH DAILY.  30 tablet  3  . aspirin 81 MG tablet Take 81 mg by mouth daily.      . budesonide-formoterol (SYMBICORT) 80-4.5 MCG/ACT inhaler Inhale 2 puffs into the lungs 2 (two) times daily.  10.2 g  12  . busPIRone (BUSPAR) 15 MG tablet TAKE 1 TABLET BY MOUTH ONCE DAILY.  30 tablet  2  . ezetimibe (ZETIA) 10 MG tablet Take 1 tablet (10 mg total) by mouth daily.  30 tablet  5  . GLIPIZIDE XL 10 MG 24 hr tablet TAKE 1 TABLET BY MOUTH DAILY.  30 tablet  3  . metFORMIN (GLUCOPHAGE) 1000 MG tablet TAKE 1 TABLET BY MOUTH TWICE DAILY WITH FOOD FOR DIABETES.  60 tablet  3  . pravastatin (PRAVACHOL) 40 MG tablet TAKE (1) TABLET BY MOUTH AT BEDTIME FOR CHOLESTEROL.  30 tablet  3  . risperidone (RISPERDAL) 4 MG tablet TAKE (1) TABLET BY MOUTH AT BEDTIME.  30 tablet  4  . tiotropium (SPIRIVA) 18 MCG inhalation capsule Place 18 mcg into inhaler and inhale daily.      Marland Kitchen triamterene-hydrochlorothiazide (MAXZIDE-25) 37.5-25 MG per tablet TAKE (1) TABLET BY MOUTH ONCE DAILY.  30 tablet  3  . valsartan (DIOVAN) 320 MG tablet TAKE ONE TABLET BY  MOUTH DAILY.  30 tablet  3  . ibuprofen (ADVIL,MOTRIN) 800 MG tablet Take 800 mg by mouth 2 (two) times daily as needed.       . [DISCONTINUED] sildenafil (VIAGRA) 100 MG tablet Take 100 mg by mouth. Take one tablet 30 mins before intercourse        Current Facility-Administered Medications  Medication Dose Route Frequency Provider Last Rate Last Dose  . Influenza (>/= 3 years) inactive virus vaccine (FLVIRIN/FLUZONE) injection SUSP 0.5 mL  0.5 mL Intramuscular Once Fayrene Helper, MD        Past Medical History  Diagnosis Date  . Nicotine addiction   . Schizophrenia   . Hyperlipidemia   . Obesity   . Hypertension   . Diabetes mellitus   . COPD (chronic obstructive pulmonary disease)   . Arthritis     Past Surgical History  Procedure Laterality Date  . Colonoscopy    . Colonoscopy N/A 04/01/2013    Procedure: COLONOSCOPY;  Surgeon: Danie Binder, MD;  Location: [facility];  Service: Endoscopy;  Laterality: N/A;  10:00 AM-moved to [place] notified pt  .  Cataract extraction w/phaco Left 11/20/2013    Procedure: CATARACT EXTRACTION PHACO AND INTRAOCULAR LENS PLACEMENT (IOC);  Surgeon: Tonny Branch, MD;  Location: [facility];  Service: Ophthalmology;  Laterality: Left;  CDE:10.26  . Cataract extraction w/phaco Right 12/08/2013    Procedure: RIGHT EYE CATARACT EXTRACTION PHACO AND INTRAOCULAR LENS PLACEMENT ;  Surgeon: Tonny Branch, MD;  Location: [facility];  Service: Ophthalmology;  Laterality: Right;  CDE 12.38  . Eye surgery Left 11/2013    cataract extraction    Family History  Problem Relation Age of Onset  . Diabetes Mother   . Hypertension Mother   . Stroke Mother     History   Social History  . Marital Status: Widowed    Spouse Name: N/A    Number of Children: 2  . Years of Education: N/A   Occupational History  . retired from [employer]  . Smoking status: Current Every Day Smoker -- 1.00 packs/day for 50 years    Types: Cigarettes   . Smokeless tobacco: Not on file  . Alcohol Use: No  . Drug Use: No  . Sexual Activity: Yes    Birth Control/ Protection: None   Other Topics Concern  . Not on file   Social History Narrative  . No narrative on file    Review of Systems:  All systems reviewed.  They are negative to the above problem except as previously stated.  Vital Signs: BP 116/58  Pulse 100  Ht 6\' 1"  (1.854 m)  Wt 251 lb (113.853 kg)  BMI 33.12 kg/m2  Physical Exam Patient is an obese 63 yo in NAD HEENT:  Normocephalic, atraumatic. EOMI, PERRLA.  Neck: JVP is normal.  No bruits.  Lungs: clear to auscultation. No rales no wheezes.  Heart: Regular rate and rhythm. Normal S1, S2. No S3.   No significant murmurs. PMI not displaced.  Abdomen:  Supple, nontender. Normal bowel sounds. No masses. No hepatomegaly.  Extremities:   Good distal pulses throughout. No lower extremity edema.  Musculoskeletal :moving all extremities.  Neuro:   alert and oriented x3.  CN II-XII grossly intact.   Assessment and Plan: 1.  Cardiac risk evaluation  No known CAD  With risk factores would recomm a lexiscan Sestamibi  2.  HL  Excellent control of LD with meds WIll need to be followed    3.  DM  Followed by Dr Moshe Cipro  4.  Tob  Counselled on wt loss  5.  OBesity  Discussed exercise and wt loss    F/U based on test results.

## 2014-07-24 NOTE — Patient Instructions (Signed)
Your physician has requested that you have a lexiscan myoview. For further information please visit [email or web address]. Please follow instruction sheet, as given.   Please follow up with Dr.Simpson   Your physician recommends that you continue on your current medications as directed. Please refer to the Current Medication list given to you today.

## 2014-07-28 ENCOUNTER — Other Ambulatory Visit (HOSPITAL_COMMUNITY): Payer: Medicare Other

## 2014-07-30 ENCOUNTER — Encounter (HOSPITAL_COMMUNITY): Payer: Medicare Other

## 2014-07-30 ENCOUNTER — Ambulatory Visit (HOSPITAL_COMMUNITY): Admission: RE | Admit: 2014-07-30 | Payer: Medicare Other | Source: Ambulatory Visit

## 2014-09-04 ENCOUNTER — Ambulatory Visit (INDEPENDENT_AMBULATORY_CARE_PROVIDER_SITE_OTHER): Payer: Medicare Other | Admitting: Urology

## 2014-09-04 DIAGNOSIS — N281 Cyst of kidney, acquired: Secondary | ICD-10-CM

## 2014-10-08 ENCOUNTER — Other Ambulatory Visit: Payer: Self-pay | Admitting: Family Medicine

## 2014-10-22 ENCOUNTER — Other Ambulatory Visit: Payer: Self-pay | Admitting: Family Medicine

## 2014-11-03 ENCOUNTER — Encounter: Payer: Medicare Other | Admitting: Family Medicine

## 2014-11-10 ENCOUNTER — Other Ambulatory Visit: Payer: Self-pay | Admitting: Family Medicine

## 2014-11-20 ENCOUNTER — Other Ambulatory Visit: Payer: Self-pay | Admitting: Family Medicine

## 2014-11-30 ENCOUNTER — Ambulatory Visit (INDEPENDENT_AMBULATORY_CARE_PROVIDER_SITE_OTHER): Payer: Medicare Other | Admitting: Family Medicine

## 2014-11-30 ENCOUNTER — Encounter: Payer: Self-pay | Admitting: Family Medicine

## 2014-11-30 ENCOUNTER — Ambulatory Visit (INDEPENDENT_AMBULATORY_CARE_PROVIDER_SITE_OTHER): Payer: Medicare Other

## 2014-11-30 VITALS — BP 114/74 | HR 98 | Resp 16 | Ht 73.0 in | Wt 252.0 lb

## 2014-11-30 DIAGNOSIS — E785 Hyperlipidemia, unspecified: Secondary | ICD-10-CM | POA: Diagnosis not present

## 2014-11-30 DIAGNOSIS — I10 Essential (primary) hypertension: Secondary | ICD-10-CM

## 2014-11-30 DIAGNOSIS — Z23 Encounter for immunization: Secondary | ICD-10-CM | POA: Insufficient documentation

## 2014-11-30 DIAGNOSIS — E119 Type 2 diabetes mellitus without complications: Secondary | ICD-10-CM | POA: Diagnosis not present

## 2014-11-30 DIAGNOSIS — E669 Obesity, unspecified: Secondary | ICD-10-CM | POA: Diagnosis not present

## 2014-11-30 DIAGNOSIS — Z720 Tobacco use: Secondary | ICD-10-CM

## 2014-11-30 DIAGNOSIS — F209 Schizophrenia, unspecified: Secondary | ICD-10-CM

## 2014-11-30 DIAGNOSIS — F17210 Nicotine dependence, cigarettes, uncomplicated: Secondary | ICD-10-CM | POA: Diagnosis not present

## 2014-11-30 DIAGNOSIS — F17200 Nicotine dependence, unspecified, uncomplicated: Secondary | ICD-10-CM

## 2014-11-30 DIAGNOSIS — R1084 Generalized abdominal pain: Secondary | ICD-10-CM

## 2014-11-30 DIAGNOSIS — E1169 Type 2 diabetes mellitus with other specified complication: Secondary | ICD-10-CM

## 2014-11-30 MED ORDER — RISPERIDONE 4 MG PO TABS: ORAL_TABLET | ORAL | Status: DC

## 2014-11-30 MED ORDER — POLYETHYLENE GLYCOL 3350 17 GM/SCOOP PO POWD: 17.0000 g | Freq: Every day | ORAL | Status: DC

## 2014-11-30 MED ORDER — RISPERIDONE 1 MG PO TABS: 1.0000 mg | ORAL_TABLET | Freq: Every day | ORAL | Status: DC

## 2014-11-30 NOTE — Assessment & Plan Note (Signed)
Controlled, no change in medication DASH diet and commitment to daily physical activity for a minimum of 30 minutes discussed and encouraged, as a part of hypertension management. The importance of attaining a healthy weight is also discussed.  

## 2014-11-30 NOTE — Patient Instructions (Addendum)
Annual wellness, in 4 month, call if you need me before  Please work on smoking cessation  Labs needed HBa1C, lipid, cmp and EGFr, CBC  as soon as possible, within next 1 week  Microalb from office today, also flu vaccine  I will prescribe the ridspersal at your current dose and send a msg to Dr Franchot Mimes about  this  You are referred for eye exam    New to help with abdominal pain and bowel movements is mirilax every day

## 2014-11-30 NOTE — Progress Notes (Signed)
Subjective:    Patient ID: Matthew Molina, male    DOB: 1951-05-10, 63 y.o.   MRN: [id number]  HPI The PT is here for follow up and re-evaluation of chronic medical conditions, medication management and review of any available recent lab and radiology data.  Preventive health is updated, specifically  Cancer screening and Immunization.    The PT denies any adverse reactions to current medications since the last visit.  1 month h/o generalized abdominal pain Denies polyuria, polydipsia, blurred vision , or hypoglycemic episodes.     Review of Systems See HPI Denies recent fever or chills. Denies sinus pressure, nasal congestion, ear pain or sore throat. Denies chest congestion, productive cough or wheezing. Denies chest pains, palpitations and leg swelling One month h/o generalized  abdominal pain, and constipation Denies dysuria, frequency, hesitancy or incontinence. Denies uncontrolled  joint pain, swelling and limitation in mobility. Denies headaches, seizures, numbness, or tingling. Denies uncontrolled depression, anxiety or insomnia. Denies skin break down or rash.        Objective:   Physical Exam  BP 114/74 mmHg  Pulse 98  Resp 16  Ht 6\' 1"  (1.854 m)  Wt 252 lb (114.306 kg)  BMI 33.25 kg/m2  SpO2 98% Patient alert and oriented and in no cardiopulmonary distress.  HEENT: No facial asymmetry, EOMI,   oropharynx pink and moist.  Neck supple no JVD, no mass.  Chest: Clear to auscultation bilaterally.  CVS: S1, S2 no murmurs, no S3.Regular rate.  ABD: Soft non tender. No organomegaly or mass, normal bowel sounds  Ext: No edema  MS: Adequate ROM spine, shoulders, hips and knees.  Skin: Intact, no ulcerations or rash noted.  Psych: Good eye contact, normal affect. Memory intact not anxious or depressed appearing.  CNS: CN 2-12 intact, power,  normal throughout.no focal deficits noted.       Assessment & Plan:  Essential hypertension Controlled, no  change in medication DASH diet and commitment to daily physical activity for a minimum of 30 minutes discussed and encouraged, as a part of hypertension management. The importance of attaining a healthy weight is also discussed.    Need for prophylactic vaccination and inoculation against influenza Vaccine administered at visit.    Diabetes mellitus type 2 in obese Controlled, no change in medication Patient advised to reduce carb and sweets, commit to regular physical activity, take meds as prescribed, test blood as directed, and attempt to lose weight, to improve blood sugar control.    Hyperlipidemia Improved but Tg still high Hyperlipidemia:Low fat diet discussed and encouraged.  Updated lab needed at/ before next visit.    NICOTINE ADDICTION Unchanged Patient counseled for approximately 5 minutes regarding the health risks of ongoing nicotine use, specifically all types of cancer, heart disease, stroke and respiratory failure. The options available for help with cessation ,the behavioral changes to assist the process, and the option to either gradully reduce usage  Or abruptly stop.is also discussed. Pt is also encouraged to set specific goals in number of cigarettes used daily, as well as to set a quit date.    Obesity (BMI 30.0-34.9) Unchanged. Patient re-educated about  the importance of commitment to a  minimum of 150 minutes of exercise per week. The importance of healthy food choices with portion control discussed. Encouraged to start a food diary, count calories and to consider  joining a support group. Sample diet sheets offered. Goals set by the patient for the next several months.  Schizophrenia, unspecified type Stable and controlled on chronic meds, for years. will prescribe from this office per pt request and notify his psychiatrist   Abdominal pain, generalized Benign exam, history of constipation will address this issue , pt to call back for  ongoing symptoms No change in bM noted and colonoscopy is up to date

## 2014-11-30 NOTE — Assessment & Plan Note (Signed)
Vaccine administered at visit.  

## 2014-12-02 LAB — MICROALBUMIN / CREATININE URINE RATIO
Creatinine, Urine: 61.2 mg/dL
Microalb Creat Ratio: 13.1 mg/g (ref 0.0–30.0)
Microalb, Ur: 0.8 mg/dL (ref <2.0)

## 2014-12-09 LAB — COMPLETE METABOLIC PANEL WITH GFR
ALT: 24 U/L (ref 0–53)
AST: 19 U/L (ref 0–37)
Albumin: 4.3 g/dL (ref 3.5–5.2)
Alkaline Phosphatase: 50 U/L (ref 39–117)
BUN: 9 mg/dL (ref 6–23)
CO2: 22 mEq/L (ref 19–32)
Calcium: 9.8 mg/dL (ref 8.4–10.5)
Chloride: 104 mEq/L (ref 96–112)
Creat: 1.4 mg/dL — ABNORMAL HIGH (ref 0.50–1.35)
GFR, Est African American: 61 mL/min
GFR, Est Non African American: 53 mL/min — ABNORMAL LOW
Glucose, Bld: 71 mg/dL (ref 70–99)
Potassium: 4.4 mEq/L (ref 3.5–5.3)
Sodium: 136 mEq/L (ref 135–145)
Total Bilirubin: 0.4 mg/dL (ref 0.2–1.2)
Total Protein: 7.6 g/dL (ref 6.0–8.3)

## 2014-12-09 LAB — CBC WITH DIFFERENTIAL/PLATELET
Basophils Absolute: 0.1 10*3/uL (ref 0.0–0.1)
Basophils Relative: 1 % (ref 0–1)
Eosinophils Absolute: 0.1 10*3/uL (ref 0.0–0.7)
Eosinophils Relative: 2 % (ref 0–5)
HCT: 38.3 % — ABNORMAL LOW (ref 39.0–52.0)
Hemoglobin: 13 g/dL (ref 13.0–17.0)
Lymphocytes Relative: 37 % (ref 12–46)
Lymphs Abs: 2.7 10*3/uL (ref 0.7–4.0)
MCH: 29 pg (ref 26.0–34.0)
MCHC: 33.9 g/dL (ref 30.0–36.0)
MCV: 85.5 fL (ref 78.0–100.0)
MPV: 9.3 fL — ABNORMAL LOW (ref 9.4–12.4)
Monocytes Absolute: 0.6 10*3/uL (ref 0.1–1.0)
Monocytes Relative: 9 % (ref 3–12)
Neutro Abs: 3.7 10*3/uL (ref 1.7–7.7)
Neutrophils Relative %: 51 % (ref 43–77)
Platelets: 265 10*3/uL (ref 150–400)
RBC: 4.48 MIL/uL (ref 4.22–5.81)
RDW: 14.7 % (ref 11.5–15.5)
WBC: 7.2 10*3/uL (ref 4.0–10.5)

## 2014-12-09 LAB — LIPID PANEL
Cholesterol: 124 mg/dL (ref 0–200)
HDL: 31 mg/dL — ABNORMAL LOW (ref >39)
LDL Cholesterol: 57 mg/dL (ref 0–99)
Total CHOL/HDL Ratio: 4 Ratio
Triglycerides: 181 mg/dL — ABNORMAL HIGH (ref <150)
VLDL: 36 mg/dL (ref 0–40)

## 2014-12-09 LAB — HEMOGLOBIN A1C
Hgb A1c MFr Bld: 6.1 % — ABNORMAL HIGH (ref <5.7)
Mean Plasma Glucose: 128 mg/dL — ABNORMAL HIGH (ref <117)

## 2014-12-14 ENCOUNTER — Other Ambulatory Visit: Payer: Self-pay | Admitting: Family Medicine

## 2014-12-21 ENCOUNTER — Other Ambulatory Visit: Payer: Self-pay | Admitting: Family Medicine

## 2015-01-10 DIAGNOSIS — R1084 Generalized abdominal pain: Secondary | ICD-10-CM | POA: Insufficient documentation

## 2015-01-10 NOTE — Assessment & Plan Note (Signed)
Benign exam, history of constipation will address this issue , pt to call back for ongoing symptoms No change in bM noted and colonoscopy is up to date

## 2015-01-10 NOTE — Assessment & Plan Note (Signed)
Improved but Tg still high Hyperlipidemia:Low fat diet discussed and encouraged.  Updated lab needed at/ before next visit.

## 2015-01-10 NOTE — Assessment & Plan Note (Signed)
Unchanged. Patient re-educated about  the importance of commitment to a  minimum of 150 minutes of exercise per week. The importance of healthy food choices with portion control discussed. Encouraged to start a food diary, count calories and to consider  joining a support group. Sample diet sheets offered. Goals set by the patient for the next several months.    

## 2015-01-10 NOTE — Assessment & Plan Note (Signed)
Controlled, no change in medication Patient advised to reduce carb and sweets, commit to regular physical activity, take meds as prescribed, test blood as directed, and attempt to lose weight, to improve blood sugar control.  

## 2015-01-10 NOTE — Assessment & Plan Note (Signed)
Unchanged. Patient counseled for approximately 5 minutes regarding the health risks of ongoing nicotine use, specifically all types of cancer, heart disease, stroke and respiratory failure. The options available for help with cessation ,the behavioral changes to assist the process, and the option to either gradully reduce usage  Or abruptly stop.is also discussed. Pt is also encouraged to set specific goals in number of cigarettes used daily, as well as to set a quit date.  

## 2015-01-10 NOTE — Assessment & Plan Note (Signed)
Stable and controlled on chronic meds, for years. will prescribe from this office per pt request and notify his psychiatrist

## 2015-01-17 ENCOUNTER — Inpatient Hospital Stay (HOSPITAL_COMMUNITY)
Admission: EM | Admit: 2015-01-17 | Discharge: 2015-01-20 | DRG: 190 | Disposition: A | Discharge status: Home / Self Care | Payer: Medicare Other | Attending: Internal Medicine | Admitting: Internal Medicine

## 2015-01-17 ENCOUNTER — Emergency Department (HOSPITAL_COMMUNITY): Payer: Medicare Other

## 2015-01-17 ENCOUNTER — Encounter (HOSPITAL_COMMUNITY): Payer: Self-pay | Admitting: Emergency Medicine

## 2015-01-17 DIAGNOSIS — Z720 Tobacco use: Secondary | ICD-10-CM

## 2015-01-17 DIAGNOSIS — Z6832 Body mass index (BMI) 32.0-32.9, adult: Secondary | ICD-10-CM | POA: Diagnosis not present

## 2015-01-17 DIAGNOSIS — IMO0001 Reserved for inherently not codable concepts without codable children: Secondary | ICD-10-CM | POA: Diagnosis present

## 2015-01-17 DIAGNOSIS — N179 Acute kidney failure, unspecified: Secondary | ICD-10-CM | POA: Diagnosis present

## 2015-01-17 DIAGNOSIS — J9601 Acute respiratory failure with hypoxia: Secondary | ICD-10-CM | POA: Diagnosis present

## 2015-01-17 DIAGNOSIS — I10 Essential (primary) hypertension: Secondary | ICD-10-CM | POA: Diagnosis present

## 2015-01-17 DIAGNOSIS — R0602 Shortness of breath: Secondary | ICD-10-CM | POA: Diagnosis present

## 2015-01-17 DIAGNOSIS — E871 Hypo-osmolality and hyponatremia: Secondary | ICD-10-CM | POA: Diagnosis present

## 2015-01-17 DIAGNOSIS — F17210 Nicotine dependence, cigarettes, uncomplicated: Secondary | ICD-10-CM | POA: Diagnosis present

## 2015-01-17 DIAGNOSIS — N189 Chronic kidney disease, unspecified: Secondary | ICD-10-CM | POA: Diagnosis present

## 2015-01-17 DIAGNOSIS — N182 Chronic kidney disease, stage 2 (mild): Secondary | ICD-10-CM | POA: Diagnosis present

## 2015-01-17 DIAGNOSIS — J441 Chronic obstructive pulmonary disease with (acute) exacerbation: Secondary | ICD-10-CM | POA: Diagnosis present

## 2015-01-17 DIAGNOSIS — M1990 Unspecified osteoarthritis, unspecified site: Secondary | ICD-10-CM | POA: Diagnosis present

## 2015-01-17 DIAGNOSIS — I129 Hypertensive chronic kidney disease with stage 1 through stage 4 chronic kidney disease, or unspecified chronic kidney disease: Secondary | ICD-10-CM | POA: Diagnosis present

## 2015-01-17 DIAGNOSIS — E1165 Type 2 diabetes mellitus with hyperglycemia: Secondary | ICD-10-CM | POA: Diagnosis present

## 2015-01-17 DIAGNOSIS — F209 Schizophrenia, unspecified: Secondary | ICD-10-CM | POA: Diagnosis present

## 2015-01-17 DIAGNOSIS — E785 Hyperlipidemia, unspecified: Secondary | ICD-10-CM | POA: Diagnosis present

## 2015-01-17 DIAGNOSIS — Z9841 Cataract extraction status, right eye: Secondary | ICD-10-CM | POA: Diagnosis not present

## 2015-01-17 DIAGNOSIS — E669 Obesity, unspecified: Secondary | ICD-10-CM | POA: Diagnosis present

## 2015-01-17 DIAGNOSIS — R000 Tachycardia, unspecified: Secondary | ICD-10-CM | POA: Diagnosis present

## 2015-01-17 DIAGNOSIS — T380X5A Adverse effect of glucocorticoids and synthetic analogues, initial encounter: Secondary | ICD-10-CM | POA: Diagnosis present

## 2015-01-17 DIAGNOSIS — D649 Anemia, unspecified: Secondary | ICD-10-CM | POA: Diagnosis present

## 2015-01-17 DIAGNOSIS — R0902 Hypoxemia: Secondary | ICD-10-CM | POA: Diagnosis present

## 2015-01-17 DIAGNOSIS — F17200 Nicotine dependence, unspecified, uncomplicated: Secondary | ICD-10-CM | POA: Diagnosis present

## 2015-01-17 HISTORY — DX: Acute kidney failure, unspecified: N17.9

## 2015-01-17 HISTORY — DX: Acute respiratory failure with hypoxia: J96.01

## 2015-01-17 LAB — GLUCOSE, CAPILLARY: Glucose-Capillary: 112 mg/dL — ABNORMAL HIGH (ref 70–99)

## 2015-01-17 LAB — CBC WITH DIFFERENTIAL/PLATELET
Basophils Absolute: 0 10*3/uL (ref 0.0–0.1)
Basophils Relative: 0 % (ref 0–1)
Eosinophils Absolute: 0.5 10*3/uL (ref 0.0–0.7)
Eosinophils Relative: 7 % — ABNORMAL HIGH (ref 0–5)
HCT: 34.7 % — ABNORMAL LOW (ref 39.0–52.0)
Hemoglobin: 11.5 g/dL — ABNORMAL LOW (ref 13.0–17.0)
Lymphocytes Relative: 32 % (ref 12–46)
Lymphs Abs: 2.5 10*3/uL (ref 0.7–4.0)
MCH: 29.1 pg (ref 26.0–34.0)
MCHC: 33.1 g/dL (ref 30.0–36.0)
MCV: 87.8 fL (ref 78.0–100.0)
Monocytes Absolute: 0.6 10*3/uL (ref 0.1–1.0)
Monocytes Relative: 8 % (ref 3–12)
Neutro Abs: 4 10*3/uL (ref 1.7–7.7)
Neutrophils Relative %: 53 % (ref 43–77)
Platelets: 217 10*3/uL (ref 150–400)
RBC: 3.95 MIL/uL — ABNORMAL LOW (ref 4.22–5.81)
RDW: 14.2 % (ref 11.5–15.5)
WBC: 7.6 10*3/uL (ref 4.0–10.5)

## 2015-01-17 LAB — COMPREHENSIVE METABOLIC PANEL
ALT: 25 U/L (ref 0–53)
AST: 32 U/L (ref 0–37)
Albumin: 4 g/dL (ref 3.5–5.2)
Alkaline Phosphatase: 46 U/L (ref 39–117)
Anion gap: 8 (ref 5–15)
BUN: 14 mg/dL (ref 6–23)
CO2: 22 mmol/L (ref 19–32)
Calcium: 8.8 mg/dL (ref 8.4–10.5)
Chloride: 100 mmol/L (ref 96–112)
Creatinine, Ser: 1.69 mg/dL — ABNORMAL HIGH (ref 0.50–1.35)
GFR calc Af Amer: 47 mL/min — ABNORMAL LOW (ref >90)
GFR calc non Af Amer: 41 mL/min — ABNORMAL LOW (ref >90)
Glucose, Bld: 82 mg/dL (ref 70–99)
Potassium: 3.8 mmol/L (ref 3.5–5.1)
Sodium: 130 mmol/L — ABNORMAL LOW (ref 135–145)
Total Bilirubin: 0.5 mg/dL (ref 0.3–1.2)
Total Protein: 7.6 g/dL (ref 6.0–8.3)

## 2015-01-17 LAB — RETICULOCYTES
RBC.: 3.84 MIL/uL — ABNORMAL LOW (ref 4.22–5.81)
Retic Count, Absolute: 65.3 10*3/uL (ref 19.0–186.0)
Retic Ct Pct: 1.7 % (ref 0.4–3.1)

## 2015-01-17 LAB — BRAIN NATRIURETIC PEPTIDE: B Natriuretic Peptide: 16 pg/mL (ref 0.0–100.0)

## 2015-01-17 LAB — TROPONIN I: Troponin I: <0.03 ng/mL (ref <0.031)

## 2015-01-17 MED ORDER — IPRATROPIUM-ALBUTEROL 0.5-2.5 (3) MG/3ML IN SOLN
3.0000 mL | Freq: Once | RESPIRATORY_TRACT | Status: AC
  Administered 2015-01-17: 3 mL via RESPIRATORY_TRACT

## 2015-01-17 MED ORDER — AMLODIPINE BESYLATE 5 MG PO TABS
10.0000 mg | ORAL_TABLET | Freq: Every day | ORAL | Status: DC
  Administered 2015-01-18 – 2015-01-20 (×3): 10 mg via ORAL
  Filled 2015-01-17 (×3): qty 2

## 2015-01-17 MED ORDER — BUSPIRONE HCL 5 MG PO TABS
15.0000 mg | ORAL_TABLET | Freq: Every day | ORAL | Status: DC
  Administered 2015-01-18 – 2015-01-20 (×3): 15 mg via ORAL
  Filled 2015-01-17 (×3): qty 3

## 2015-01-17 MED ORDER — SODIUM CHLORIDE 0.9 % IJ SOLN: 3.0000 mL | INTRAMUSCULAR | Status: DC | PRN

## 2015-01-17 MED ORDER — OXYCODONE HCL 5 MG PO TABS: 5.0000 mg | ORAL_TABLET | ORAL | Status: DC | PRN

## 2015-01-17 MED ORDER — PRAVASTATIN SODIUM 40 MG PO TABS
40.0000 mg | ORAL_TABLET | Freq: Every day | ORAL | Status: DC
  Administered 2015-01-18 – 2015-01-19 (×2): 40 mg via ORAL
  Filled 2015-01-17 (×2): qty 1

## 2015-01-17 MED ORDER — METHYLPREDNISOLONE SODIUM SUCC 125 MG IJ SOLR
125.0000 mg | Freq: Once | INTRAMUSCULAR | Status: AC
  Administered 2015-01-18: 125 mg via INTRAVENOUS
  Filled 2015-01-17: qty 2

## 2015-01-17 MED ORDER — ENOXAPARIN SODIUM 40 MG/0.4ML ~~LOC~~ SOLN
40.0000 mg | SUBCUTANEOUS | Status: DC
  Administered 2015-01-17 – 2015-01-19 (×3): 40 mg via SUBCUTANEOUS
  Filled 2015-01-17 (×3): qty 0.4

## 2015-01-17 MED ORDER — ALBUTEROL SULFATE (2.5 MG/3ML) 0.083% IN NEBU
2.5000 mg | INHALATION_SOLUTION | Freq: Once | RESPIRATORY_TRACT | Status: AC
  Administered 2015-01-17: 2.5 mg via RESPIRATORY_TRACT

## 2015-01-17 MED ORDER — IRBESARTAN 300 MG PO TABS
300.0000 mg | ORAL_TABLET | Freq: Every day | ORAL | Status: DC
  Administered 2015-01-18 – 2015-01-20 (×3): 300 mg via ORAL
  Filled 2015-01-17 (×3): qty 1

## 2015-01-17 MED ORDER — SODIUM CHLORIDE 0.9 % IV SOLN: 250.0000 mL | INTRAVENOUS | Status: DC | PRN

## 2015-01-17 MED ORDER — CETYLPYRIDINIUM CHLORIDE 0.05 % MT LIQD
7.0000 mL | Freq: Two times a day (BID) | OROMUCOSAL | Status: DC
  Administered 2015-01-17 – 2015-01-20 (×6): 7 mL via OROMUCOSAL

## 2015-01-17 MED ORDER — ALBUTEROL SULFATE (2.5 MG/3ML) 0.083% IN NEBU
3.0000 mL | INHALATION_SOLUTION | Freq: Four times a day (QID) | RESPIRATORY_TRACT | Status: DC | PRN
  Administered 2015-01-18: 3 mL via RESPIRATORY_TRACT
  Filled 2015-01-17: qty 3

## 2015-01-17 MED ORDER — IPRATROPIUM-ALBUTEROL 0.5-2.5 (3) MG/3ML IN SOLN
3.0000 mL | RESPIRATORY_TRACT | Status: DC
  Administered 2015-01-17 – 2015-01-20 (×14): 3 mL via RESPIRATORY_TRACT
  Filled 2015-01-17 (×14): qty 3

## 2015-01-17 MED ORDER — METHYLPREDNISOLONE SODIUM SUCC 125 MG IJ SOLR
125.0000 mg | Freq: Once | INTRAMUSCULAR | Status: AC
  Administered 2015-01-17: 125 mg via INTRAVENOUS
  Filled 2015-01-17: qty 2

## 2015-01-17 MED ORDER — ALUM & MAG HYDROXIDE-SIMETH 200-200-20 MG/5ML PO SUSP
30.0000 mL | Freq: Four times a day (QID) | ORAL | Status: DC | PRN
  Administered 2015-01-18 – 2015-01-20 (×2): 30 mL via ORAL
  Filled 2015-01-17 (×2): qty 30

## 2015-01-17 MED ORDER — IPRATROPIUM-ALBUTEROL 0.5-2.5 (3) MG/3ML IN SOLN
RESPIRATORY_TRACT | Status: AC
  Filled 2015-01-17: qty 3

## 2015-01-17 MED ORDER — NICOTINE 21 MG/24HR TD PT24
21.0000 mg | MEDICATED_PATCH | Freq: Every day | TRANSDERMAL | Status: DC
  Administered 2015-01-17 – 2015-01-20 (×4): 21 mg via TRANSDERMAL
  Filled 2015-01-17 (×4): qty 1

## 2015-01-17 MED ORDER — EZETIMIBE 10 MG PO TABS
10.0000 mg | ORAL_TABLET | Freq: Every day | ORAL | Status: DC
  Administered 2015-01-18 – 2015-01-20 (×3): 10 mg via ORAL
  Filled 2015-01-17 (×3): qty 1

## 2015-01-17 MED ORDER — SODIUM CHLORIDE 0.9 % IJ SOLN
3.0000 mL | Freq: Two times a day (BID) | INTRAMUSCULAR | Status: DC
  Administered 2015-01-17 – 2015-01-20 (×4): 3 mL via INTRAVENOUS

## 2015-01-17 MED ORDER — IPRATROPIUM BROMIDE 0.02 % IN SOLN: 0.5000 mg | Freq: Once | RESPIRATORY_TRACT | Status: DC

## 2015-01-17 MED ORDER — POLYETHYLENE GLYCOL 3350 17 GM/SCOOP PO POWD
17.0000 g | Freq: Every day | ORAL | Status: DC
  Filled 2015-01-17: qty 255

## 2015-01-17 MED ORDER — ONDANSETRON HCL 4 MG/2ML IJ SOLN: 4.0000 mg | Freq: Four times a day (QID) | INTRAMUSCULAR | Status: DC | PRN

## 2015-01-17 MED ORDER — ALBUTEROL SULFATE (2.5 MG/3ML) 0.083% IN NEBU
INHALATION_SOLUTION | RESPIRATORY_TRACT | Status: AC
  Filled 2015-01-17: qty 3

## 2015-01-17 MED ORDER — INSULIN ASPART 100 UNIT/ML ~~LOC~~ SOLN
0.0000 [IU] | Freq: Every day | SUBCUTANEOUS | Status: DC
  Administered 2015-01-18: 3 [IU] via SUBCUTANEOUS
  Administered 2015-01-19: 4 [IU] via SUBCUTANEOUS

## 2015-01-17 MED ORDER — ACETAMINOPHEN 325 MG PO TABS: 650.0000 mg | ORAL_TABLET | Freq: Four times a day (QID) | ORAL | Status: DC | PRN

## 2015-01-17 MED ORDER — ALBUTEROL SULFATE (2.5 MG/3ML) 0.083% IN NEBU: 2.5000 mg | INHALATION_SOLUTION | RESPIRATORY_TRACT | Status: DC

## 2015-01-17 MED ORDER — ASPIRIN EC 81 MG PO TBEC
81.0000 mg | DELAYED_RELEASE_TABLET | Freq: Every day | ORAL | Status: DC
  Administered 2015-01-18 – 2015-01-20 (×3): 81 mg via ORAL
  Filled 2015-01-17 (×3): qty 1

## 2015-01-17 MED ORDER — ACETAMINOPHEN 650 MG RE SUPP: 650.0000 mg | Freq: Four times a day (QID) | RECTAL | Status: DC | PRN

## 2015-01-17 MED ORDER — TRIAMTERENE-HCTZ 37.5-25 MG PO TABS
1.0000 | ORAL_TABLET | Freq: Every day | ORAL | Status: DC
  Administered 2015-01-18 – 2015-01-20 (×3): 1 via ORAL
  Filled 2015-01-17 (×3): qty 1

## 2015-01-17 MED ORDER — IPRATROPIUM BROMIDE 0.02 % IN SOLN
1.0000 mg | Freq: Once | RESPIRATORY_TRACT | Status: AC
  Administered 2015-01-17: 1 mg via RESPIRATORY_TRACT
  Filled 2015-01-17: qty 5

## 2015-01-17 MED ORDER — INSULIN ASPART 100 UNIT/ML ~~LOC~~ SOLN
0.0000 [IU] | Freq: Three times a day (TID) | SUBCUTANEOUS | Status: DC
  Administered 2015-01-18: 2 [IU] via SUBCUTANEOUS
  Administered 2015-01-18: 3 [IU] via SUBCUTANEOUS
  Administered 2015-01-19: 5 [IU] via SUBCUTANEOUS

## 2015-01-17 MED ORDER — HYDROMORPHONE HCL 1 MG/ML IJ SOLN: 0.5000 mg | INTRAMUSCULAR | Status: DC | PRN

## 2015-01-17 MED ORDER — ALBUTEROL (5 MG/ML) CONTINUOUS INHALATION SOLN
10.0000 mg/h | INHALATION_SOLUTION | Freq: Once | RESPIRATORY_TRACT | Status: AC
  Administered 2015-01-17: 10 mg/h via RESPIRATORY_TRACT
  Filled 2015-01-17: qty 20

## 2015-01-17 MED ORDER — RISPERIDONE 1 MG PO TABS
5.0000 mg | ORAL_TABLET | Freq: Every day | ORAL | Status: DC
  Administered 2015-01-17 – 2015-01-19 (×3): 5 mg via ORAL
  Filled 2015-01-17 (×3): qty 5

## 2015-01-17 MED ORDER — ONDANSETRON HCL 4 MG PO TABS: 4.0000 mg | ORAL_TABLET | Freq: Four times a day (QID) | ORAL | Status: DC | PRN

## 2015-01-17 NOTE — ED Provider Notes (Signed)
CSN: [id number]     Arrival date & time 01/17/15  1614 History   First MD Initiated Contact with Patient 01/17/15 1737     Chief Complaint  Patient presents with  . Shortness of Breath     HPI Pt was seen at 1755.  Per pt, c/o gradual onset and worsening of persistent cough, wheezing and SOB for the past 2 days.  Describes his symptoms as "my COPD might be acting up."  Has been using home MDI and nebs without relief.  Denies CP/palpitations, no back pain, no abd pain, no N/V/D, no fevers, no rash.     Past Medical History  Diagnosis Date  . Nicotine addiction   . Schizophrenia   . Hyperlipidemia   . Obesity   . Hypertension   . Diabetes mellitus   . COPD (chronic obstructive pulmonary disease)   . Arthritis    Past Surgical History  Procedure Laterality Date  . Colonoscopy    . Colonoscopy N/A 04/01/2013    Procedure: COLONOSCOPY;  Surgeon: Danie Binder, MD;  [facility];  Service: Endoscopy;  Laterality: N/A;  10:00 AM-moved to [place] notified pt  . Cataract extraction w/phaco Left 11/20/2013    Procedure: CATARACT EXTRACTION PHACO AND INTRAOCULAR LENS PLACEMENT (IOC);  Surgeon: Tonny Branch, MD;  Location: [facility];  Service: Ophthalmology;  Laterality: Left;  CDE:10.26  . Cataract extraction w/phaco Right 12/08/2013    Procedure: RIGHT EYE CATARACT EXTRACTION PHACO AND INTRAOCULAR LENS PLACEMENT ;  Surgeon: Tonny Branch, MD;  Location: [facility];  Service: Ophthalmology;  Laterality: Right;  CDE 12.38  . Eye surgery Left 11/2013    cataract extraction   Family History  Problem Relation Age of Onset  . Diabetes Mother   . Hypertension Mother   . Stroke Mother    History  Substance Use Topics  . Smoking status: Current Every Day Smoker -- 2.00 packs/day for 50 years    Types: Cigarettes  . Smokeless tobacco: Never Used  . Alcohol Use: No    Review of Systems ROS: Statement: All systems negative except as marked or noted in the HPI; Constitutional:  Negative for fever and chills. ; ; Eyes: Negative for eye pain, redness and discharge. ; ; ENMT: Negative for ear pain, hoarseness, nasal congestion, sinus pressure and sore throat. ; ; Cardiovascular: Negative for chest pain, palpitations, diaphoresis, and peripheral edema. ; ; Respiratory: +cough, wheezing, SOB. Negative for stridor. ; ; Gastrointestinal: Negative for nausea, vomiting, diarrhea, abdominal pain, blood in stool, hematemesis, jaundice and rectal bleeding. . ; ; Genitourinary: Negative for dysuria, flank pain and hematuria. ; ; Musculoskeletal: Negative for back pain and neck pain. Negative for swelling and trauma.; ; Skin: Negative for pruritus, rash, abrasions, blisters, bruising and skin lesion.; ; Neuro: Negative for headache, lightheadedness and neck stiffness. Negative for weakness, altered level of consciousness , altered mental status, extremity weakness, paresthesias, involuntary movement, seizure and syncope.        Allergies  Sertraline hcl  Home Medications   Prior to Admission medications   Medication Sig Start Date End Date Taking? Authorizing Provider  albuterol (PROVENTIL HFA;VENTOLIN HFA) 108 (90 BASE) MCG/ACT inhaler Inhale 2 puffs into the lungs every 6 (six) hours as needed for wheezing or shortness of breath. 01/12/14  Yes Fayrene Helper, MD  amLODipine (NORVASC) 10 MG tablet TAKE (1) TABLET BY MOUTH DAILY. 12/23/14  Yes Fayrene Helper, MD  aspirin EC 81 MG tablet Take 81 mg  by mouth daily.   Yes Historical Provider, MD  budesonide-formoterol (SYMBICORT) 80-4.5 MCG/ACT inhaler Inhale 2 puffs into the lungs 2 (two) times daily. 01/12/14  Yes Fayrene Helper, MD  busPIRone (BUSPAR) 15 MG tablet TAKE 1 TABLET BY MOUTH ONCE DAILY. 05/21/14  Yes Fayrene Helper, MD  GLIPIZIDE XL 10 MG 24 hr tablet TAKE 1 TABLET BY MOUTH DAILY. 12/23/14  Yes Fayrene Helper, MD  metFORMIN (GLUCOPHAGE) 1000 MG tablet TAKE 1 TABLET BY MOUTH TWICE DAILY WITH FOOD FOR DIABETES.  12/23/14  Yes Fayrene Helper, MD  polyethylene glycol powder (GLYCOLAX/MIRALAX) powder Take 17 g by mouth daily. 11/30/14  Yes Fayrene Helper, MD  pravastatin (PRAVACHOL) 40 MG tablet TAKE (1) TABLET BY MOUTH AT BEDTIME FOR CHOLESTEROL. 12/23/14  Yes Fayrene Helper, MD  risperiDONE (RISPERDAL) 1 MG tablet Take 1 tablet (1 mg total) by mouth at bedtime. 11/30/14  Yes Fayrene Helper, MD  risperidone (RISPERDAL) 4 MG tablet TAKE (1) TABLET BY MOUTH AT BEDTIME. 11/30/14  Yes Fayrene Helper, MD  tiotropium (SPIRIVA) 18 MCG inhalation capsule Place 18 mcg into inhaler and inhale daily.   Yes Historical Provider, MD  triamterene-hydrochlorothiazide (MAXZIDE-25) 37.5-25 MG per tablet TAKE (1) TABLET BY MOUTH ONCE DAILY. 12/23/14  Yes Fayrene Helper, MD  valsartan (DIOVAN) 320 MG tablet TAKE ONE TABLET BY MOUTH DAILY. 12/23/14  Yes Fayrene Helper, MD  ZETIA 10 MG tablet TAKE 1 TABLET BY MOUTH ONCE DAILY. 12/23/14  Yes Fayrene Helper, MD  ACCU-CHEK AVIVA PLUS test strip TEST BLOOD SUGAR ONCE DAILY AS DIRECTED. 12/14/14   Fayrene Helper, MD  ibuprofen (ADVIL,MOTRIN) 800 MG tablet TAKE ONE TABLET BY MOUTH EVERY 8 HOURS AS NEEDED FOR PAIN. MUST LAST 90 DAYS. Patient not taking: Reported on 01/17/2015 12/23/14   Fayrene Helper, MD   BP 122/69 mmHg  Pulse 109  Temp(Src) 97.9 F (36.6 C) (Oral)  Resp 37  Ht 6\' 1"  (1.854 m)  Wt 250 lb (113.399 kg)  BMI 32.99 kg/m2  SpO2 91% Physical Exam  1800; Physical examination:  Nursing notes reviewed; Vital signs and O2 SAT reviewed;  Constitutional: Well developed, Well nourished, Well hydrated, Uncomfortable appearing; Head:  Normocephalic, atraumatic; Eyes: EOMI, PERRL, No scleral icterus; ENMT: Mouth and pharynx normal, Mucous membranes moist; Neck: Supple, Full range of motion, No lymphadenopathy; Cardiovascular: Tachycardic rate and rhythm, No gallop; Respiratory: Breath sounds diminished & equal bilaterally, +insp/exp wheeze bilat with  audible wheezing. Speaking short phrases. Sitting upright. +non-productive cough. Tachypneic.; Chest: Nontender, Movement normal; Abdomen: Soft, Nontender, Nondistended, Normal bowel sounds; Genitourinary: No CVA tenderness; Extremities: Pulses normal, No tenderness, No edema, No calf edema or asymmetry.; Neuro: AA&Ox3, Major CN grossly intact.  Speech clear. No gross focal motor or sensory deficits in extremities.; Skin: Color normal, Warm, Dry.   ED Course  Procedures     EKG Interpretation   Date/Time:  Sunday January 17 2015 16:40:31 EST Ventricular Rate:  104 PR Interval:  187 QRS Duration: 86 QT Interval:  333 QTC Calculation: 438 R Axis:   -63 Text Interpretation:  Sinus tachycardia Left axis deviation Left anterior  fascicular block Low voltage, precordial leads When compared with ECG of  11/10/2013 No significant change was found Confirmed by [facility]  MD,  Nunzio Cory [phone]) on 01/17/2015 6:43:13 PM      MDM  MDM Reviewed: previous chart, nursing note and vitals Reviewed previous: labs and ECG Interpretation: labs, ECG and x-ray Total time providing critical  care: 30-74 minutes. This excludes time spent performing separately reportable procedures and services. Consults: admitting MD     CRITICAL CARE Performed by: Alfonzo Feller Total critical care time: 35 Critical care time was exclusive of separately billable procedures and treating other patients. Critical care was necessary to treat or prevent imminent or life-threatening deterioration. Critical care was time spent personally by me on the following activities: development of treatment plan with patient and/or surrogate as well as nursing, discussions with consultants, evaluation of patient's response to treatment, examination of patient, obtaining history from patient or surrogate, ordering and performing treatments and interventions, ordering and review of laboratory studies, ordering and review of radiographic  studies, pulse oximetry and re-evaluation of patient's condition.   Results for orders placed or performed during the hospital encounter of 01/17/15  CBC with Differential  Result Value Ref Range   WBC 7.6 4.0 - 10.5 K/uL   RBC 3.95 (L) 4.22 - 5.81 MIL/uL   Hemoglobin 11.5 (L) 13.0 - 17.0 g/dL   HCT 34.7 (L) 39.0 - 52.0 %   MCV 87.8 78.0 - 100.0 fL   MCH 29.1 26.0 - 34.0 pg   MCHC 33.1 30.0 - 36.0 g/dL   RDW 14.2 11.5 - 15.5 %   Platelets 217 150 - 400 K/uL   Neutrophils Relative % 53 43 - 77 %   Neutro Abs 4.0 1.7 - 7.7 K/uL   Lymphocytes Relative 32 12 - 46 %   Lymphs Abs 2.5 0.7 - 4.0 K/uL   Monocytes Relative 8 3 - 12 %   Monocytes Absolute 0.6 0.1 - 1.0 K/uL   Eosinophils Relative 7 (H) 0 - 5 %   Eosinophils Absolute 0.5 0.0 - 0.7 K/uL   Basophils Relative 0 0 - 1 %   Basophils Absolute 0.0 0.0 - 0.1 K/uL  Brain natriuretic peptide  Result Value Ref Range   B Natriuretic Peptide 16.0 0.0 - 100.0 pg/mL  Comprehensive metabolic panel  Result Value Ref Range   Sodium 130 (L) 135 - 145 mmol/L   Potassium 3.8 3.5 - 5.1 mmol/L   Chloride 100 96 - 112 mmol/L   CO2 22 19 - 32 mmol/L   Glucose, Bld 82 70 - 99 mg/dL   BUN 14 6 - 23 mg/dL   Creatinine, Ser 1.69 (H) 0.50 - 1.35 mg/dL   Calcium 8.8 8.4 - 10.5 mg/dL   Total Protein 7.6 6.0 - 8.3 g/dL   Albumin 4.0 3.5 - 5.2 g/dL   AST 32 0 - 37 U/L   ALT 25 0 - 53 U/L   Alkaline Phosphatase 46 39 - 117 U/L   Total Bilirubin 0.5 0.3 - 1.2 mg/dL   GFR calc non Af Amer 41 (L) >90 mL/min   GFR calc Af Amer 47 (L) >90 mL/min   Anion gap 8 5 - 15  Troponin I  Result Value Ref Range   Troponin I <0.03 <0.031 ng/mL   Dg Chest Portable 1 View 01/17/2015   CLINICAL DATA:  Shortness of breath.  EXAM: PORTABLE CHEST - 1 VIEW  COMPARISON:  08/11/2012.  FINDINGS: Cardiopericardial silhouette within normal limits. Mediastinal contours normal. Trachea midline. No airspace disease or effusion. Monitoring leads project over the chest. Apical  lordotic projection.  IMPRESSION: No active disease.   Electronically Signed   By: Dereck Ligas M.D.   On: 01/17/2015 17:21    2015:  Pt sitting upright with audible wheezing on arrival: IV solumedrol and hour long neb started.  Neb completed: pt's O2 Sats drop to 87% R/A while at rest, lungs continue with exp wheezing and audible wheezing. O2 N/C placed with Sats increasing to 94-96%. Dx and testing d/w pt and family.  Questions answered.  Verb understanding, agreeable to admit. T/C to [facility]. Arnoldo Morale, case discussed, including:  HPI, pertinent PM/SHx, VS/PE, dx testing, ED course and treatment:  Agreeable to admit, requests to write temporary orders, obtain tele bed to team [facility].   Francine Graven, DO 01/19/15 1700

## 2015-01-17 NOTE — ED Notes (Signed)
MD at bedside. 

## 2015-01-17 NOTE — H&P (Addendum)
[facility] Admission History and Physical       Matthew Molina ZOX:[id number] DOB: 1951-07-02 DOA: 01/17/2015  Referring physician: EDP PCP: [name], MD  Specialists:   Chief Complaint: SOB  HPI: Matthew Molina is a 64 y.o. male with a history of COPD, HTN, DM2, Hyperlipidemia, and Schizophrenia who presents to the ED with complaints of worsening SOB, Cough and Wheezing over the past 24 hours.  He denies any fevers or chills.  He was found to have decreased O2 saturations into the 80's and was placed on 4 liters of NCo2.   He was administered a continuous nebulizer treatment in the ED and IV Solumedrol and had mild improvement and was referred for medical admission.      Review of Systems:  Constitutional: No Weight Loss, No Weight Gain, Night Sweats, Fevers, Chills, Dizziness, Fatigue, or Generalized Weakness HEENT: No Headaches, Difficulty Swallowing,Tooth/Dental Problems,Sore Throat,  No Sneezing, Rhinitis, Ear Ache, Nasal Congestion, or Post Nasal Drip,  Cardio-vascular:  No Chest pain, Orthopnea, PND, Edema in Lower Extremities, Anasarca, Dizziness, Palpitations  Resp: +Dyspnea, No DOE, +Productive Cough, No Hemoptysis, + Wheezing.    GI: No Heartburn, Indigestion, Abdominal Pain, Nausea, Vomiting, Diarrhea, Hematemesis, Hematochezia, Melena, Change in Bowel Habits,  Loss of Appetite  GU: No Dysuria, Change in Color of Urine, No Urgency or Frequency, No Flank pain.  Musculoskeletal: No Joint Pain or Swelling, No Decreased Range of Motion, No Back Pain.  Neurologic: No Syncope, No Seizures, Muscle Weakness, Paresthesia, Vision Disturbance or Loss, No Diplopia, No Vertigo, No Difficulty Walking,  Skin: No Rash or Lesions. Psych: No Change in Mood or Affect, No Depression or Anxiety, No Memory loss, No Confusion, or Hallucinations   Past Medical History  Diagnosis Date  . Nicotine addiction   . Schizophrenia   . Hyperlipidemia   . Obesity   . Hypertension   .  Diabetes mellitus   . COPD (chronic obstructive pulmonary disease)   . Arthritis       Past Surgical History  Procedure Laterality Date  . Colonoscopy    . Colonoscopy N/A 04/01/2013    Procedure: COLONOSCOPY;  Surgeon: Danie Binder, MD;  [facility];  Service: Endoscopy;  Laterality: N/A;  10:00 AM-moved to [place] notified pt  . Cataract extraction w/phaco Left 11/20/2013    Procedure: CATARACT EXTRACTION PHACO AND INTRAOCULAR LENS PLACEMENT (IOC);  Surgeon: Tonny Branch, MD;  Location: [facility];  Service: Ophthalmology;  Laterality: Left;  CDE:10.26  . Cataract extraction w/phaco Right 12/08/2013    Procedure: RIGHT EYE CATARACT EXTRACTION PHACO AND INTRAOCULAR LENS PLACEMENT ;  Surgeon: Tonny Branch, MD;  Location: [facility];  Service: Ophthalmology;  Laterality: Right;  CDE 12.38  . Eye surgery Left 11/2013    cataract extraction       Prior to Admission medications   Medication Sig Start Date End Date Taking? Authorizing Provider  albuterol (PROVENTIL HFA;VENTOLIN HFA) 108 (90 BASE) MCG/ACT inhaler Inhale 2 puffs into the lungs every 6 (six) hours as needed for wheezing or shortness of breath. 01/12/14  Yes Fayrene Helper, MD  amLODipine (NORVASC) 10 MG tablet TAKE (1) TABLET BY MOUTH DAILY. 12/23/14  Yes Fayrene Helper, MD  aspirin EC 81 MG tablet Take 81 mg by mouth daily.   Yes Historical Provider, MD  budesonide-formoterol (SYMBICORT) 80-4.5 MCG/ACT inhaler Inhale 2 puffs into the lungs 2 (two) times daily. 01/12/14  Yes Fayrene Helper, MD  busPIRone (BUSPAR) 15 MG  tablet TAKE 1 TABLET BY MOUTH ONCE DAILY. 05/21/14  Yes Fayrene Helper, MD  GLIPIZIDE XL 10 MG 24 hr tablet TAKE 1 TABLET BY MOUTH DAILY. 12/23/14  Yes Fayrene Helper, MD  metFORMIN (GLUCOPHAGE) 1000 MG tablet TAKE 1 TABLET BY MOUTH TWICE DAILY WITH FOOD FOR DIABETES. 12/23/14  Yes Fayrene Helper, MD  polyethylene glycol powder (GLYCOLAX/MIRALAX) powder Take 17 g by mouth daily. 11/30/14  Yes  Fayrene Helper, MD  pravastatin (PRAVACHOL) 40 MG tablet TAKE (1) TABLET BY MOUTH AT BEDTIME FOR CHOLESTEROL. 12/23/14  Yes Fayrene Helper, MD  risperiDONE (RISPERDAL) 1 MG tablet Take 1 tablet (1 mg total) by mouth at bedtime. 11/30/14  Yes Fayrene Helper, MD  risperidone (RISPERDAL) 4 MG tablet TAKE (1) TABLET BY MOUTH AT BEDTIME. 11/30/14  Yes Fayrene Helper, MD  tiotropium (SPIRIVA) 18 MCG inhalation capsule Place 18 mcg into inhaler and inhale daily.   Yes Historical Provider, MD  triamterene-hydrochlorothiazide (MAXZIDE-25) 37.5-25 MG per tablet TAKE (1) TABLET BY MOUTH ONCE DAILY. 12/23/14  Yes Fayrene Helper, MD  valsartan (DIOVAN) 320 MG tablet TAKE ONE TABLET BY MOUTH DAILY. 12/23/14  Yes Fayrene Helper, MD  ZETIA 10 MG tablet TAKE 1 TABLET BY MOUTH ONCE DAILY. 12/23/14  Yes Fayrene Helper, MD  ACCU-CHEK AVIVA PLUS test strip TEST BLOOD SUGAR ONCE DAILY AS DIRECTED. 12/14/14   Fayrene Helper, MD  ibuprofen (ADVIL,MOTRIN) 800 MG tablet TAKE ONE TABLET BY MOUTH EVERY 8 HOURS AS NEEDED FOR PAIN. MUST LAST 90 DAYS. Patient not taking: Reported on 01/17/2015 12/23/14   Fayrene Helper, MD      Allergies  Allergen Reactions  . Sertraline Hcl     Stomach upset/pain     Social History:  reports that he has been smoking Cigarettes.  He has a 100 pack-year smoking history. He has never used smokeless tobacco. He reports that he does not drink alcohol or use illicit drugs.     Family History  Problem Relation Age of Onset  . Diabetes Mother   . Hypertension Mother   . Stroke Mother        Physical Exam:  GEN:  Pleasant  Well Nourished and Well Developed   64 y.o. African American male examined  and in no acute distress; cooperative with exam Filed Vitals:   01/17/15 1958 01/17/15 1959 01/17/15 2030 01/17/15 2114  BP:    122/60  Pulse: 109 109 107 103  Temp:    98 F (36.7 C)  TempSrc:    Oral  Resp: 40 37 27 20  Height:      Weight:      SpO2: 93%  91% 92% 95%   Blood pressure 122/60, pulse 103, temperature 98 F (36.7 C), temperature source Oral, resp. rate 20, height 6\' 1"  (1.854 m), weight 113.399 kg (250 lb), SpO2 95 %. PSYCH: He is alert and oriented x4; does not appear anxious does not appear depressed; affect is normal HEENT: Normocephalic and Atraumatic, Mucous membranes pink; PERRLA; EOM intact; Fundi:  Benign;  No scleral icterus, Nares: Patent, Oropharynx: Clear, sparse Poor Dentition,    Neck:  FROM, No Cervical Lymphadenopathy nor Thyromegaly or Carotid Bruit; No JVD; Breasts:: Not examined CHEST WALL: No tenderness CHEST: Normal respiration, clear to auscultation bilaterally HEART: Regular rate and rhythm; no murmurs rubs or gallops BACK: No kyphosis or scoliosis; No CVA tenderness ABDOMEN: Positive Bowel Sounds,  Soft Non-Tender; No Masses, No Organomegaly.  Rectal Exam: Not done EXTREMITIES: No Cyanosis, Clubbing, or Edema; No Ulcerations. Genitalia: not examined PULSES: 2+ and symmetric SKIN: Normal hydration no rash or ulceration CNS:  Alert and Oriented x 4, No Focal Deficits  Vascular: pulses palpable throughout    Labs on Admission:  Basic Metabolic Panel:  Recent Labs Lab 01/17/15 1705  NA 130*  K 3.8  CL 100  CO2 22  GLUCOSE 82  BUN 14  CREATININE 1.69*  CALCIUM 8.8   Liver Function Tests:  Recent Labs Lab 01/17/15 1705  AST 32  ALT 25  ALKPHOS 46  BILITOT 0.5  PROT 7.6  ALBUMIN 4.0   No results for input(s): LIPASE, AMYLASE in the last 168 hours. No results for input(s): AMMONIA in the last 168 hours. CBC:  Recent Labs Lab 01/17/15 1705  WBC 7.6  NEUTROABS 4.0  HGB 11.5*  HCT 34.7*  MCV 87.8  PLT 217   Cardiac Enzymes:  Recent Labs Lab 01/17/15 1705  TROPONINI <0.03    BNP (last 3 results) No results for input(s): PROBNP in the last 8760 hours. CBG: No results for input(s): GLUCAP in the last 168 hours.  Radiological Exams on Admission: Dg Chest Portable 1  View  01/17/2015   CLINICAL DATA:  Shortness of breath.  EXAM: PORTABLE CHEST - 1 VIEW  COMPARISON:  08/11/2012.  FINDINGS: Cardiopericardial silhouette within normal limits. Mediastinal contours normal. Trachea midline. No airspace disease or effusion. Monitoring leads project over the chest. Apical lordotic projection.  IMPRESSION: No active disease.   Electronically Signed   By: Dereck Ligas M.D.   On: 01/17/2015 17:21     EKG: Independently reviewed. Sinus Tachycardia rate 104 Not Acute Changes +Left Axis Deveiation   Assessment/Plan:   64 y.o. male with  Principal Problem:   1.   COPD with acute exacerbation   Telemtry Monitoring   DUONebs   IV Solumedrol, Steroid Taper   O2 PRN   Monitor O2 sats   Active Problems:   2.   SOB (shortness of breath)   O2  At 4 Liters, Titrate PRN   Monitor O2 sats       3.   Hypoxia- due to #1   Improved with NCO2      4.   Diabetes mellitus type 2, uncontrolled, without complications   Hold Metformin Rx,    Continue Glipizide as Glucose Tolerates    SSI coverage PRN    Check HbA1C in AM     5.   Hyperlipidemia   Continue Pravastatin and Zetia Rx     6.   Essential hypertension   Continue Amlodipine and Triamterene/HCTZ as BP tolerates   Monitor BPs     7.   Anemia    Check Anemia Panel     8.   NICOTINE ADDICTION    Counseled Re: Smoking Cessation, and advised him to obtain Nicotine Patches (System)  as Outpatient to     continue to Wean down due to his Disease Process and prognosis of continued worsening due    to Tobacco Smoking   Nicotine Patch while inpatient     9.   Schizophrenia, unspecified type   Continue Risperdone Rx   10.   DVT Prophylaxis    Lovenox    Code Status:   FULL CODE Family Communication:    Family at Bedside Disposition Plan:  Inpatient        Time spent:  [facility] Pager [phone]  If 7AM -7PM Please Contact the Day Rounding Team MD for  [facility]  If 7PM-7AM, Please Contact Night-Floor Coverage  [email or web address] Password [email or web address] 01/17/2015, 9:18 PM

## 2015-01-17 NOTE — ED Notes (Signed)
Pt ambulated on room air around nursing station. Pt maintained oxygen saturation of 92-93% with HR-118 for entire walk. Pt began breathing with less difficulty as we walked for a longer period of time. Pt began coughing and labored breathing as soon as he sat back on the bed.

## 2015-01-17 NOTE — ED Notes (Addendum)
Reported that pt's sats on RA decreased to 87%, o2 applied via [place] at 4 l/m-sats increased to 91%

## 2015-01-17 NOTE — ED Notes (Signed)
Pt reports onset of cough last night, SOB today.

## 2015-01-18 ENCOUNTER — Encounter (HOSPITAL_COMMUNITY): Payer: Self-pay | Admitting: Internal Medicine

## 2015-01-18 DIAGNOSIS — E1165 Type 2 diabetes mellitus with hyperglycemia: Secondary | ICD-10-CM

## 2015-01-18 DIAGNOSIS — I10 Essential (primary) hypertension: Secondary | ICD-10-CM

## 2015-01-18 DIAGNOSIS — J9601 Acute respiratory failure with hypoxia: Secondary | ICD-10-CM | POA: Diagnosis present

## 2015-01-18 DIAGNOSIS — E871 Hypo-osmolality and hyponatremia: Secondary | ICD-10-CM | POA: Diagnosis present

## 2015-01-18 DIAGNOSIS — J441 Chronic obstructive pulmonary disease with (acute) exacerbation: Principal | ICD-10-CM

## 2015-01-18 LAB — BASIC METABOLIC PANEL
Anion gap: 14 (ref 5–15)
BUN: 17 mg/dL (ref 6–23)
CO2: 20 mmol/L (ref 19–32)
Calcium: 9.8 mg/dL (ref 8.4–10.5)
Chloride: 97 mmol/L (ref 96–112)
Creatinine, Ser: 1.58 mg/dL — ABNORMAL HIGH (ref 0.50–1.35)
GFR calc Af Amer: 51 mL/min — ABNORMAL LOW (ref >90)
GFR calc non Af Amer: 44 mL/min — ABNORMAL LOW (ref >90)
Glucose, Bld: 192 mg/dL — ABNORMAL HIGH (ref 70–99)
Potassium: 4.7 mmol/L (ref 3.5–5.1)
Sodium: 131 mmol/L — ABNORMAL LOW (ref 135–145)

## 2015-01-18 LAB — FERRITIN: Ferritin: 119 ng/mL (ref 22–322)

## 2015-01-18 LAB — IRON AND TIBC
Iron: 29 ug/dL — ABNORMAL LOW (ref 42–165)
Saturation Ratios: 10 % — ABNORMAL LOW (ref 20–55)
TIBC: 296 ug/dL (ref 215–435)
UIBC: 267 ug/dL (ref 125–400)

## 2015-01-18 LAB — CBC
HCT: 37.5 % — ABNORMAL LOW (ref 39.0–52.0)
Hemoglobin: 12.4 g/dL — ABNORMAL LOW (ref 13.0–17.0)
MCH: 29.2 pg (ref 26.0–34.0)
MCHC: 33.1 g/dL (ref 30.0–36.0)
MCV: 88.2 fL (ref 78.0–100.0)
Platelets: 238 10*3/uL (ref 150–400)
RBC: 4.25 MIL/uL (ref 4.22–5.81)
RDW: 14.6 % (ref 11.5–15.5)
WBC: 11.9 10*3/uL — ABNORMAL HIGH (ref 4.0–10.5)

## 2015-01-18 LAB — VITAMIN B12: Vitamin B-12: 327 pg/mL (ref 211–911)

## 2015-01-18 LAB — GLUCOSE, CAPILLARY
Glucose-Capillary: 197 mg/dL — ABNORMAL HIGH (ref 70–99)
Glucose-Capillary: 272 mg/dL — ABNORMAL HIGH (ref 70–99)
Glucose-Capillary: 273 mg/dL — ABNORMAL HIGH (ref 70–99)
Glucose-Capillary: 279 mg/dL — ABNORMAL HIGH (ref 70–99)

## 2015-01-18 LAB — FOLATE: Folate: 9.4 ng/mL

## 2015-01-18 MED ORDER — SODIUM CHLORIDE 0.9 % IV SOLN
INTRAVENOUS | Status: DC
  Administered 2015-01-19: 02:00:00 via INTRAVENOUS

## 2015-01-18 MED ORDER — METHYLPREDNISOLONE SODIUM SUCC 125 MG IJ SOLR
60.0000 mg | Freq: Four times a day (QID) | INTRAMUSCULAR | Status: DC
  Administered 2015-01-18 – 2015-01-20 (×8): 60 mg via INTRAVENOUS
  Filled 2015-01-18 (×8): qty 2

## 2015-01-18 MED ORDER — HYDROCOD POLST-CHLORPHEN POLST 10-8 MG/5ML PO LQCR
5.0000 mL | Freq: Three times a day (TID) | ORAL | Status: DC | PRN
  Administered 2015-01-18 – 2015-01-20 (×4): 5 mL via ORAL
  Filled 2015-01-18 (×3): qty 5

## 2015-01-18 MED ORDER — SODIUM CHLORIDE 0.9 % IV BOLUS (SEPSIS)
500.0000 mL | Freq: Once | INTRAVENOUS | Status: AC
  Administered 2015-01-18: 500 mL via INTRAVENOUS

## 2015-01-18 MED ORDER — HYDROCOD POLST-CHLORPHEN POLST 10-8 MG/5ML PO LQCR
5.0000 mL | Freq: Two times a day (BID) | ORAL | Status: DC | PRN
Start: 2015-01-18 — End: 2015-01-18
  Administered 2015-01-18: 5 mL via ORAL
  Filled 2015-01-18 (×2): qty 5

## 2015-01-18 MED ORDER — LEVOFLOXACIN IN D5W 750 MG/150ML IV SOLN
750.0000 mg | INTRAVENOUS | Status: DC
Start: 2015-01-18 — End: 2015-01-19
  Administered 2015-01-18: 750 mg via INTRAVENOUS
  Filled 2015-01-18: qty 150

## 2015-01-18 MED ORDER — POLYETHYLENE GLYCOL 3350 17 G PO PACK
17.0000 g | PACK | Freq: Every day | ORAL | Status: DC
  Administered 2015-01-18 – 2015-01-20 (×3): 17 g via ORAL
  Filled 2015-01-18 (×3): qty 1

## 2015-01-18 MED ORDER — BENZONATATE 100 MG PO CAPS
100.0000 mg | ORAL_CAPSULE | Freq: Two times a day (BID) | ORAL | Status: DC | PRN
  Administered 2015-01-18 – 2015-01-20 (×4): 100 mg via ORAL
  Filled 2015-01-18 (×4): qty 1

## 2015-01-18 NOTE — Care Management Note (Signed)
    Page 1 of 1   01/20/2015     12:43:39 PM CARE MANAGEMENT NOTE 01/20/2015  Patient:  Matthew Molina, Matthew Molina   Account Number:  [id number]  Date Initiated:  01/18/2015  Documentation initiated by:  Theophilus Kinds  Subjective/Objective Assessment:   Pt admitted from home with COPD exacerbation. Pt lives alone and has family/friends to call on when he needs assistance. Pt still drives and anticipates discharge home. Pt has a cane that he uses.     Action/Plan:   Will continue to follow for discharge planning needs. ? need for home O2 and neb machine at discharge.   Anticipated DC Date:  01/21/2015   Anticipated DC Plan:  HOME/SELF CARE      DC Planning Services  CM consult      PAC Choice  DURABLE MEDICAL EQUIPMENT   Choice offered to / List presented to:  C-1 Patient   DME arranged  NEBULIZER MACHINE      DME agency  [facility].        Status of service:  Completed, signed off Medicare Important Message given?  YES (If response is "NO", the following Medicare IM given date fields will be blank) Date Medicare IM given:  01/20/2015 Medicare IM given by:  Theophilus Kinds Date Additional Medicare IM given:   Additional Medicare IM given by:    Discharge Disposition:  HOME/SELF CARE  Per UR Regulation:    If discussed at Long Length of Stay Meetings, dates discussed:    Comments:  01/20/15 [place], RN BSN CM Pt to be discharged home today. Pt does not qualify for home O2. Pt does need neb machine and would like it from [facility]. Emma with [facility] is aware and will arrange delivery to pts home at discharge. No other CM needs noted. Pt and pts nurse aware of discharge arrangements.  01/18/15 [place], RN BSN CM

## 2015-01-18 NOTE — Progress Notes (Signed)
UR chart review completed.  

## 2015-01-18 NOTE — Progress Notes (Signed)
ANTIBIOTIC CONSULT NOTE - INITIAL  Pharmacy Consult for Levaquin Indication: COPD exacerbation  Allergies  Allergen Reactions  . Sertraline Hcl     Stomach upset/pain   Patient Measurements: Height: 6\' 1"  (185.4 cm) Weight: 250 lb (113.399 kg) IBW/kg (Calculated) : 79.9  Vital Signs: Temp: 97.8 F (36.6 C) (02/01 0709) Temp Source: Oral (02/01 0709) BP: 115/70 mmHg (02/01 0709) Pulse Rate: 103 (02/01 0709) Intake/Output from previous day: 01/31 0701 - 02/01 0700 In: 480 [P.O.:480] Out: 1500 [Urine:1500] Intake/Output from this shift:    Labs:  Recent Labs  01/17/15 1705 01/18/15 0551  WBC 7.6 11.9*  HGB 11.5* 12.4*  PLT 217 238  CREATININE 1.69* 1.58*   Estimated Creatinine Clearance: 61.5 mL/min (by C-G formula based on Cr of 1.58). No results for input(s): VANCOTROUGH, VANCOPEAK, VANCORANDOM, GENTTROUGH, GENTPEAK, GENTRANDOM, TOBRATROUGH, TOBRAPEAK, TOBRARND, AMIKACINPEAK, AMIKACINTROU, AMIKACIN in the last 72 hours.   Microbiology: No results found for this or any previous visit (from the past 720 hour(s)).  Medical History: Past Medical History  Diagnosis Date  . Nicotine addiction   . Schizophrenia   . Hyperlipidemia   . Obesity   . Hypertension   . Diabetes mellitus   . COPD (chronic obstructive pulmonary disease)   . Arthritis   . Acute respiratory failure with hypoxia    Anti-infectives    Start     Dose/Rate Route Frequency Ordered Stop   01/18/15 1600  levofloxacin (LEVAQUIN) IVPB 750 mg     750 mg100 mL/hr over 90 Minutes Intravenous Every 24 hours 01/18/15 1141       Assessment: 64yo male admitted with acute respiratory failure and COPD exacerbation.  Asked to initiate Levaquin.  SCr elevated on admission but appears to be improving.  Goal of Therapy:  Eradicate infection.  Plan:  Levaquin 750mg  IV q24hrs Switch to PO when appropriate Monitor labs and progress  Hart Robinsons A 01/18/2015,11:42 AM

## 2015-01-18 NOTE — Progress Notes (Signed)
[facility] PROGRESS NOTE  Matthew Molina NWG:[id number] DOB: Jul 18, 1951 DOA: 01/17/2015 PCP: [name], MD  Assessment/Plan:  1. Acute respiratory failure with hypoxia in setting of COPD exacerbation. Some improvement this am.  sats >90% on 4L [place]. Duonebs, solumedrol, oxygen, antibiotic.   1. COPD with acute exacerbation: see above Continue DUONebs, IV Solumedrol, sats 91-93% on 4L. Is not usually on home oxygen.  Active Problems:   3.tachycardia: related to above. Some improvement with fluids. If no improvement will consider CT chest to rule out PE   4. Diabetes mellitus type 2, uncontrolled, without complicationsHold Metformin Rx, Continue Glipizide as Glucose Tolerates SSI coverage PRN CBG 197. HbA1C 6.1.   5. Hyperlipidemia:Continue Pravastatin and Zetia Rx    6. Essential hypertension:Continue Amlodipine and Triamterene/HCTZ as BP toleratesMonitor BPs    7. Anemia: stable.    8. NICOTINE ADDICTION :Counseled Re: Smoking Cessation.    9. Schizophrenia, unspecified type: appears stable at baseline Continue Risperdone Rx   10. DVT Prophylaxis  Lovenox    Code Status: full Family Communication: none present Disposition Plan: home when ready   Consultants:  none  Procedures:  none  Antibiotics:  Levaquin 01/18/15>>  HPI/Subjective: Sitting on side of bed. Reports breathing "better"  Objective: Filed Vitals:   01/18/15 0709  BP: 115/70  Pulse: 103  Temp: 97.8 F (36.6 C)  Resp: 20    Intake/Output Summary (Last 24 hours) at 01/18/15 1051 Last data filed at 01/18/15 0618  Gross per 24 hour  Intake    480 ml  Output   1500 ml  Net  -1020 ml   Filed Weights   01/17/15 1627  Weight: 113.399 kg (250 lb)    Exam:   General:  Well nourished NAD  Cardiovascular: tachycardic but regular, no MGR   Respiratory: mild increased work  of breathing. BS diminished faint expiratory wheeze  Abdomen: soft +BS non-tender to palpation  Musculoskeletal: no clubbing or cyanosis   Data Reviewed: Basic Metabolic Panel:  Recent Labs Lab 01/17/15 1705 01/18/15 0551  NA 130* 131*  K 3.8 4.7  CL 100 97  CO2 22 20  GLUCOSE 82 192*  BUN 14 17  CREATININE 1.69* 1.58*  CALCIUM 8.8 9.8   Liver Function Tests:  Recent Labs Lab 01/17/15 1705  AST 32  ALT 25  ALKPHOS 46  BILITOT 0.5  PROT 7.6  ALBUMIN 4.0   No results for input(s): LIPASE, AMYLASE in the last 168 hours. No results for input(s): AMMONIA in the last 168 hours. CBC:  Recent Labs Lab 01/17/15 1705 01/18/15 0551  WBC 7.6 11.9*  NEUTROABS 4.0  --   HGB 11.5* 12.4*  HCT 34.7* 37.5*  MCV 87.8 88.2  PLT 217 238   Cardiac Enzymes:  Recent Labs Lab 01/17/15 1705  TROPONINI <0.03   BNP (last 3 results) No results for input(s): PROBNP in the last 8760 hours. CBG:  Recent Labs Lab 01/17/15 2126 01/18/15 0733  GLUCAP 112* 197*    No results found for this or any previous visit (from the past 240 hour(s)).   Studies: Dg Chest Portable 1 View  01/17/2015   CLINICAL DATA:  Shortness of breath.  EXAM: PORTABLE CHEST - 1 VIEW  COMPARISON:  08/11/2012.  FINDINGS: Cardiopericardial silhouette within normal limits. Mediastinal contours normal. Trachea midline. No airspace disease or effusion. Monitoring leads project over the chest. Apical lordotic projection.  IMPRESSION: No active disease.   Electronically Signed   By: Arvilla Market.D.  On: 01/17/2015 17:21    Scheduled Meds: . amLODipine  10 mg Oral Daily  . antiseptic oral rinse  7 mL Mouth Rinse BID  . aspirin EC  81 mg Oral Daily  . busPIRone  15 mg Oral Daily  . enoxaparin (LOVENOX) injection  40 mg Subcutaneous Q24H  . ezetimibe  10 mg Oral Daily  . insulin aspart  0-5 Units Subcutaneous QHS  . insulin aspart  0-9 Units Subcutaneous TID WC  . ipratropium-albuterol  3 mL  Nebulization Q4H  . irbesartan  300 mg Oral Daily  . methylPREDNISolone (SOLU-MEDROL) injection  60 mg Intravenous Q6H  . nicotine  21 mg Transdermal Daily  . polyethylene glycol  17 g Oral Daily  . pravastatin  40 mg Oral q1800  . risperiDONE  5 mg Oral QHS  . sodium chloride  3 mL Intravenous Q12H  . triamterene-hydrochlorothiazide  1 tablet Oral Daily   Continuous Infusions:   Principal Problem:   Acute respiratory failure with hypoxia Active Problems:   Diabetes mellitus type 2, uncontrolled, without complications   Hyperlipidemia   Schizophrenia, unspecified type   NICOTINE ADDICTION   Essential hypertension   COPD with acute exacerbation   SOB (shortness of breath)   Anemia   Hypoxia   Hyponatremia    Time spent: [place] [facility] Pager [phone]. If 7PM-7AM, please contact night-coverage at [email or web address], password [facility] 01/18/2015, 10:51 AM  LOS: 1 day

## 2015-01-19 ENCOUNTER — Encounter (HOSPITAL_COMMUNITY): Payer: Self-pay | Admitting: Internal Medicine

## 2015-01-19 DIAGNOSIS — N179 Acute kidney failure, unspecified: Secondary | ICD-10-CM | POA: Diagnosis present

## 2015-01-19 LAB — GLUCOSE, CAPILLARY
Glucose-Capillary: 271 mg/dL — ABNORMAL HIGH (ref 70–99)
Glucose-Capillary: 337 mg/dL — ABNORMAL HIGH (ref 70–99)
Glucose-Capillary: 263 mg/dL — ABNORMAL HIGH (ref 70–99)
Glucose-Capillary: 322 mg/dL — ABNORMAL HIGH (ref 70–99)

## 2015-01-19 MED ORDER — LORATADINE 10 MG PO TABS
10.0000 mg | ORAL_TABLET | Freq: Every day | ORAL | Status: DC
  Administered 2015-01-19 – 2015-01-20 (×2): 10 mg via ORAL
  Filled 2015-01-19 (×2): qty 1

## 2015-01-19 MED ORDER — MENTHOL 3 MG MT LOZG: 1.0000 | LOZENGE | OROMUCOSAL | Status: DC | PRN

## 2015-01-19 MED ORDER — INSULIN DETEMIR 100 UNIT/ML ~~LOC~~ SOLN
10.0000 [IU] | Freq: Every day | SUBCUTANEOUS | Status: DC
  Administered 2015-01-19 – 2015-01-20 (×2): 10 [IU] via SUBCUTANEOUS
  Filled 2015-01-19 (×4): qty 0.1

## 2015-01-19 MED ORDER — LEVOFLOXACIN 750 MG PO TABS
750.0000 mg | ORAL_TABLET | Freq: Every day | ORAL | Status: DC
  Administered 2015-01-19 – 2015-01-20 (×2): 750 mg via ORAL
  Filled 2015-01-19 (×2): qty 1

## 2015-01-19 MED ORDER — INSULIN ASPART 100 UNIT/ML ~~LOC~~ SOLN
0.0000 [IU] | Freq: Three times a day (TID) | SUBCUTANEOUS | Status: DC
  Administered 2015-01-19: 11 [IU] via SUBCUTANEOUS
  Administered 2015-01-19: 8 [IU] via SUBCUTANEOUS
  Administered 2015-01-20: 5 [IU] via SUBCUTANEOUS
  Administered 2015-01-20: 8 [IU] via SUBCUTANEOUS

## 2015-01-19 NOTE — Progress Notes (Signed)
Patient 98% on room air at rest.  Patient ambulated 1000 ft. On room air and remained at 95%.  Will continue to monitor.

## 2015-01-19 NOTE — Progress Notes (Signed)
[facility] PROGRESS NOTE  Matthew CUPPLES NOT:[id number] DOB: December 07, 1951 DOA: 01/17/2015 PCP: [name], MD  Assessment/Plan: 1. Acute respiratory failure with hypoxia in setting of COPD exacerbation. Only slight improved in air flow. Increased oxygen needs to maintain  sats >90%. Continue  Duonebs, solumedrol, oxygen, antibiotic. Add flutter valve and spiriva. Mobilize  1. COPD with acute exacerbation: see above. Continue DUONebs, IV Solumedrol, sats 91-93% on 4L. Is not usually on home oxygen.  Active Problems:   3.tachycardia: resolved. HR range 85-97.    4. Diabetes mellitus type 2, uncontrolled, without complications.Holding home oral agents. SSI coverage PRN CBG 271. Will add low dose Levemir while on steroids.  HbA1C 6.1.   5. Hyperlipidemia:Continue Pravastatin and Zetia    6. Essential hypertension: controlled.  as BP toleratesMonitor BPs    7. Anemia: stable.    8. NICOTINE ADDICTION :Counseled Re: Smoking Cessation.    9. Schizophrenia, unspecified type: appears stable at baseline Continue Risperdone Rx   10. DVT Prophylaxis  Lovenox     Code Status: full Family Communication: none present Disposition Plan: home likely 2 days   Consultants:  none  Procedures:  none  Antibiotics:  levaquin 01/18/15>>>  HPI/Subjective: Sitting on side of bed. Reports improved breathing and continued cough.   Objective: Filed Vitals:   01/19/15 0836  BP: 120/75  Pulse: 92  Temp: 97.7 F (36.5 C)  Resp: 22    Intake/Output Summary (Last 24 hours) at 01/19/15 1039 Last data filed at 01/19/15 0900  Gross per 24 hour  Intake   1380 ml  Output      0 ml  Net   1380 ml   Filed Weights   01/17/15 1627  Weight: 113.399 kg (250 lb)    Exam:   General:  Well nourished appears calm  Cardiovascular: RRR no MGR no LE edema  Respiratory: normal  effort with conversation, BS remain quite diminished but improved from yesterday. No wheeze faint crackle bases  Abdomen: obese soft +BS non-tender to palpation  Musculoskeletal: no clubbing no cyanosis   Data Reviewed: Basic Metabolic Panel:  Recent Labs Lab 01/17/15 1705 01/18/15 0551  NA 130* 131*  K 3.8 4.7  CL 100 97  CO2 22 20  GLUCOSE 82 192*  BUN 14 17  CREATININE 1.69* 1.58*  CALCIUM 8.8 9.8   Liver Function Tests:  Recent Labs Lab 01/17/15 1705  AST 32  ALT 25  ALKPHOS 46  BILITOT 0.5  PROT 7.6  ALBUMIN 4.0   No results for input(s): LIPASE, AMYLASE in the last 168 hours. No results for input(s): AMMONIA in the last 168 hours. CBC:  Recent Labs Lab 01/17/15 1705 01/18/15 0551  WBC 7.6 11.9*  NEUTROABS 4.0  --   HGB 11.5* 12.4*  HCT 34.7* 37.5*  MCV 87.8 88.2  PLT 217 238   Cardiac Enzymes:  Recent Labs Lab 01/17/15 1705  TROPONINI <0.03   BNP (last 3 results)  Recent Labs  01/17/15 1705  BNP 16.0    ProBNP (last 3 results) No results for input(s): PROBNP in the last 8760 hours.  CBG:  Recent Labs Lab 01/18/15 0733 01/18/15 1118 01/18/15 1624 01/18/15 2036 01/19/15 0730  GLUCAP 197* 272* 273* 279* 271*    No results found for this or any previous visit (from the past 240 hour(s)).   Studies: Dg Chest Portable 1 View  01/17/2015   CLINICAL DATA:  Shortness of breath.  EXAM: PORTABLE CHEST - 1 VIEW  COMPARISON:  08/11/2012.  FINDINGS: Cardiopericardial silhouette within normal limits. Mediastinal contours normal. Trachea midline. No airspace disease or effusion. Monitoring leads project over the chest. Apical lordotic projection.  IMPRESSION: No active disease.   Electronically Signed   By: Dereck Ligas M.D.   On: 01/17/2015 17:21    Scheduled Meds: . amLODipine  10 mg Oral Daily  . antiseptic oral rinse  7 mL Mouth Rinse BID  . aspirin EC  81 mg Oral Daily  . busPIRone  15 mg Oral Daily  . enoxaparin (LOVENOX)  injection  40 mg Subcutaneous Q24H  . ezetimibe  10 mg Oral Daily  . insulin aspart  0-15 Units Subcutaneous TID WC  . insulin aspart  0-5 Units Subcutaneous QHS  . insulin detemir  10 Units Subcutaneous Daily  . ipratropium-albuterol  3 mL Nebulization Q4H  . irbesartan  300 mg Oral Daily  . levofloxacin  750 mg Oral Daily  . loratadine  10 mg Oral Daily  . methylPREDNISolone (SOLU-MEDROL) injection  60 mg Intravenous Q6H  . nicotine  21 mg Transdermal Daily  . polyethylene glycol  17 g Oral Daily  . pravastatin  40 mg Oral q1800  . risperiDONE  5 mg Oral QHS  . sodium chloride  3 mL Intravenous Q12H  . triamterene-hydrochlorothiazide  1 tablet Oral Daily   Continuous Infusions:   Principal Problem:   Acute respiratory failure with hypoxia Active Problems:   Diabetes mellitus type 2, uncontrolled, without complications   Hyperlipidemia   Schizophrenia, unspecified type   NICOTINE ADDICTION   Essential hypertension   COPD with acute exacerbation   SOB (shortness of breath)   Anemia   Hypoxia   Hyponatremia   Acute kidney injury    Time spent: 35 minutes    [place] [facility] Pager [phone]. If 7PM-7AM, please contact night-coverage at [email or web address], password [facility] 01/19/2015, 10:39 AM  LOS: 2 days

## 2015-01-19 NOTE — Progress Notes (Signed)
Inpatient Diabetes Program Recommendations  AACE/ADA: New Consensus Statement on Inpatient Glycemic Control (2013)  Target Ranges:  Prepandial:   less than 140 mg/dL      Peak postprandial:   less than 180 mg/dL (1-2 hours)      Critically ill patients:  140 - 180 mg/dL   Results for Matthew Molina, Matthew Molina (MRN [id number]) as of 01/19/2015 10:06  Ref. Range 01/18/2015 07:33 01/18/2015 11:18 01/18/2015 16:24 01/18/2015 20:36 01/19/2015 07:30  Glucose-Capillary Latest Range: 70-99 mg/dL 197 (H) 272 (H) 273 (H) 279 (H) 271 (H)   Diabetes history: DM2 Outpatient Diabetes medications: Glipizide 10 mg daily, Metformin 1000 mg BID Current orders for Inpatient glycemic control: Novolog 0-9 units TID with meals, Novolog 0-5 units QHS  Inpatient Diabetes Program Recommendations Insulin - Basal: While inpatient and ordered steroids, please consider ordering low dose basal insulin.  Recommend starting with Levemir 10 units daily and adjust as needed as steroids are tapered. Correction (SSI): Please consider increasing Novolog correction to moderate scale.  Thanks, Barnie Alderman, RN, MSN, CCRN, CDE Diabetes Coordinator Inpatient Diabetes Program [phone] (Team Pager) [phone] ([place] office) [phone] [facility] office)

## 2015-01-20 DIAGNOSIS — N179 Acute kidney failure, unspecified: Secondary | ICD-10-CM

## 2015-01-20 DIAGNOSIS — N189 Chronic kidney disease, unspecified: Secondary | ICD-10-CM | POA: Diagnosis present

## 2015-01-20 LAB — CBC
HCT: 36.2 % — ABNORMAL LOW (ref 39.0–52.0)
Hemoglobin: 12 g/dL — ABNORMAL LOW (ref 13.0–17.0)
MCH: 29.1 pg (ref 26.0–34.0)
MCHC: 33.1 g/dL (ref 30.0–36.0)
MCV: 87.9 fL (ref 78.0–100.0)
Platelets: 269 10*3/uL (ref 150–400)
RBC: 4.12 MIL/uL — ABNORMAL LOW (ref 4.22–5.81)
RDW: 14.6 % (ref 11.5–15.5)
WBC: 18 10*3/uL — ABNORMAL HIGH (ref 4.0–10.5)

## 2015-01-20 LAB — BASIC METABOLIC PANEL
Anion gap: 10 (ref 5–15)
BUN: 24 mg/dL — ABNORMAL HIGH (ref 6–23)
CO2: 24 mmol/L (ref 19–32)
Calcium: 9.8 mg/dL (ref 8.4–10.5)
Chloride: 99 mmol/L (ref 96–112)
Creatinine, Ser: 1.43 mg/dL — ABNORMAL HIGH (ref 0.50–1.35)
GFR calc Af Amer: 58 mL/min — ABNORMAL LOW (ref >90)
GFR calc non Af Amer: 50 mL/min — ABNORMAL LOW (ref >90)
Glucose, Bld: 255 mg/dL — ABNORMAL HIGH (ref 70–99)
Potassium: 4.2 mmol/L (ref 3.5–5.1)
Sodium: 133 mmol/L — ABNORMAL LOW (ref 135–145)

## 2015-01-20 LAB — GLUCOSE, CAPILLARY
Glucose-Capillary: 222 mg/dL — ABNORMAL HIGH (ref 70–99)
Glucose-Capillary: 296 mg/dL — ABNORMAL HIGH (ref 70–99)

## 2015-01-20 MED ORDER — PREDNISONE 10 MG PO TABS: ORAL_TABLET | ORAL | Status: DC

## 2015-01-20 MED ORDER — METHYLPREDNISOLONE SODIUM SUCC 125 MG IJ SOLR: 60.0000 mg | Freq: Two times a day (BID) | INTRAMUSCULAR | Status: DC

## 2015-01-20 MED ORDER — BENZONATATE 100 MG PO CAPS: 100.0000 mg | ORAL_CAPSULE | Freq: Two times a day (BID) | ORAL | Status: DC

## 2015-01-20 MED ORDER — NICOTINE 21 MG/24HR TD PT24: 21.0000 mg | MEDICATED_PATCH | Freq: Every day | TRANSDERMAL | Status: DC

## 2015-01-20 MED ORDER — LEVOFLOXACIN 750 MG PO TABS: 750.0000 mg | ORAL_TABLET | Freq: Every day | ORAL | Status: DC

## 2015-01-20 MED ORDER — IPRATROPIUM-ALBUTEROL 0.5-2.5 (3) MG/3ML IN SOLN: 3.0000 mL | RESPIRATORY_TRACT | Status: DC | PRN

## 2015-01-20 NOTE — Progress Notes (Signed)
Inpatient Diabetes Program Recommendations  AACE/ADA: New Consensus Statement on Inpatient Glycemic Control (2013)  Target Ranges:  Prepandial:   less than 140 mg/dL      Peak postprandial:   less than 180 mg/dL (1-2 hours)      Critically ill patients:  140 - 180 mg/dL   Results for Matthew Molina, Matthew Molina (MRN [id number]) as of 01/20/2015 07:32  Ref. Range 01/19/2015 07:30 01/19/2015 11:09 01/19/2015 16:36 01/19/2015 22:13  Glucose-Capillary Latest Range: 70-99 mg/dL 271 (H) 337 (H) 263 (H) 322 (H)   Diabetes history: DM2 Outpatient Diabetes medications: Glipizide 10 mg daily, Metformin 1000 mg BID Current orders for Inpatient glycemic control: Levemir 10 units daily, Novolog 0-15 units TID with meals, Novolog 0-5 units QHS  Inpatient Diabetes Program Recommendations Insulin - Basal: If steroids will be continued, please consider increasing Levemir to 15 units daily. Correction (SSI): Please consider increasing Novolog correction to resistant scale. Insulin-Meal Coverage: If steroids will be continued, please consider ordering Novolog 4 units TID with meals for meal coverage (in addition to Novolog correction).  Thanks, Barnie Alderman, RN, MSN, CCRN, CDE Diabetes Coordinator Inpatient Diabetes Program [phone] (Team Pager) [phone] ([place] office) [phone] [facility] office)

## 2015-01-20 NOTE — Progress Notes (Signed)
NURSING PROGRESS NOTE  KELLON CHALK [id number] Discharge Data: 01/20/2015 4:41 PM Attending Provider: No att. providers found THY:[id number] Moshe Cipro, MD   [name] to be D/C'd Home per MD order.    All IV's  discontinued and monitored for bleeding.  All belongings returned to patient for patient to take home.  AVS summary and prescriptions reviewed with patient.  Patient left floor via wheelchair, escorted by NT.  Last Documented Vital Signs:  Blood pressure 98/51, pulse 87, temperature 98.3 F (36.8 C), temperature source Oral, resp. rate 18, height 6\' 1"  (1.854 m), weight 113.399 kg (250 lb), SpO2 98 %.  Cecilie Kicks D

## 2015-01-20 NOTE — Discharge Instructions (Signed)
Acute Respiratory Failure °Respiratory failure is when your lungs are not working well and your breathing (respiratory) system fails. When respiratory failure occurs, it is difficult for your lungs to get enough oxygen, get rid of carbon dioxide, or both. Respiratory failure can be life threatening.  °Respiratory failure can be acute or chronic. Acute respiratory failure is sudden, severe, and requires emergency medical treatment. Chronic respiratory failure is less severe, happens over time, and requires ongoing treatment.  °WHAT ARE THE CAUSES OF ACUTE RESPIRATORY FAILURE?  °Any problem affecting the heart or lungs can cause acute respiratory failure. Some of these causes include the following: °· Chronic bronchitis and emphysema (COPD).   °· Blood clot going to a lung (pulmonary embolism).   °· Having water in the lungs caused by heart failure, lung injury, or infection (pulmonary edema).   °· Collapsed lung (pneumothorax).   °· Pneumonia.   °· Pulmonary fibrosis.   °· Obesity.   °· Asthma.   °· Heart failure.   °· Any type of trauma to the chest that can make breathing difficult.   °· Nerve or muscle diseases making chest movements difficult. °WHAT SYMPTOMS SHOULD YOU WATCH FOR?  °If you have any of these signs or symptoms, you should seek immediate medical care:  °· You have shortness of breath (dyspnea) with or without activity.   °· You have rapid, fast breathing (tachypnea).   °· You are wheezing. °· You are unable to say more than a few words without having to catch your breath. °· You find it very difficult to function normally. °· You have a fast heart rate.   °· You have a bluish color to your finger or toe nail beds.   °· You have confusion or drowsiness or both.   °HOW WILL MY ACUTE RESPIRATORY FAILURE BE TREATED?  °Treatment of acute respiratory failure depends on the cause of the respiratory failure. Usually, you will stay in the intensive care unit so your breathing can be watched closely. Treatment  can include the following: °· Oxygen. Oxygen can be delivered through the following: °¨ Nasal cannula. This is small tubing that goes in your nose to give you oxygen. °¨ Face mask. A face mask covers your nose and mouth to give you oxygen. °· Medicine. Different medicines can be given to help with breathing. These can include: °¨ Nebulizers. Nebulizers deliver medicines to open the air passages (bronchodilators). These medicines help to open or relax the airways in the lungs so you can breathe better. They can also help loosen mucus from your lungs. °¨ Diuretics. Diuretic medicines can help you breathe better by getting rid of extra water in your body. °¨ Steroids. Steroid medicines can help decrease swelling (inflammation) in your lungs. °¨ Antibiotics. °· Chest tube. If you have a collapsed lung (pneumothorax), a chest tube is placed to help reinflate the lung. °· Non-invasive positive pressure ventilation (NPPV). This is a tight-fitting mask that goes over your nose and mouth. The mask has tubing that is attached to a machine. The machine blows air into the tubing, which helps to keep the tiny air sacs (alveoli) in your lungs open. This machine allows you to breathe on your own. °· Ventilator. A ventilator is a breathing machine. When on a ventilator, a breathing tube is put into the lungs. A ventilator is used when you can no longer breathe well enough on your own. You may have low oxygen levels or high carbon dioxide (CO2) levels in your blood. When you are on a ventilator, sedation and pain medicines are given to make you sleep   so your lungs can heal. °Document Released: 12/09/2013 Document Revised: 04/20/2014 Document Reviewed: 12/09/2013 °[employer]® Patient Information ©2015 [employer]. This information is not intended to replace advice given to you by your health care provider. Make sure you discuss any questions you have with your health care provider. ° °

## 2015-01-20 NOTE — Discharge Summary (Signed)
Physician Discharge Summary  Matthew Molina TZG:[id number] DOB: 1951/03/25 DOA: 01/17/2015  PCP: Matthew Nakayama, MD  Admit date: 01/17/2015 Discharge date: 01/20/2015  Time spent: 40 minutes  Recommendations for Outpatient Follow-up:  1. Follow up with Dr Moshe Cipro 01/27/15 for evaluation of COPD progression, diabetes control, IDA and chronic kidney failure.    Discharge Diagnoses:  Principal Problem:   Acute respiratory failure with hypoxia Active Problems:   Diabetes mellitus type 2, uncontrolled, without complications   Hyperlipidemia   Schizophrenia, unspecified type   NICOTINE ADDICTION   Essential hypertension   COPD with acute exacerbation   SOB (shortness of breath)   Anemia   Hypoxia   Hyponatremia   Acute kidney injury   Acute on chronic kidney failure   Discharge Condition: stable  Diet recommendation: heart healthy carb modified  Filed Weights   01/17/15 1627  Weight: 113.399 kg (250 lb)    History of present illness:  Matthew Molina is a 64 y.o. male with a history of COPD, HTN, DM2, Hyperlipidemia, and Schizophrenia who presented to the ED on 01/17/15 with complaints of worsening SOB, Cough and Wheezing over the previous 24 hours. He denied any fevers or chills. He was found to have decreased O2 saturations into the 80's and was placed on 4 liters of NCo2. He was administered a continuous nebulizer treatment in the ED and IV Solumedrol and had mild improvement and was referred for medical admission.  Hospital Course:  Acute respiratory failure with hypoxia in setting of COPD exacerbation. Provided with solumedrol, nebs, antibiotics, oxygen supplementation. He slowly improved and at discharge oxygen saturation level 98% on room air at rest and 95% on room air with ambulation. Will discharge with home nebulizers, steroid taper antibiotic to complete 5 day course. Continue home inhalers as well. Of note, chart review indicates office visit in 06/2014 with PCP  noted worsening COPD and continuation of smoking. Have counseled to stop smoking  1. COPD with acute exacerbation: see above. No home O2. May benefit from PFT to track progression. Continues to smoke. Smoking cessation counseling offered  Active Problems:  2. Acute on chronic kidney failure: level II. likely related to decrease po intake prior to admission.  Provided with gently IV hydration. Creatinine trending downward at discharge. Of note, creatinine 1.26 41months ago.     3.tachycardia: resolved. HR range 85-97.    4. Diabetes mellitus type 2, uncontrolled, without complications. Related to steroids.  A1c 6.1. Not on insulin. Home oral agents held during hospitalization. CBG range 255-327 while on steroids. Covered with insulin. At discharge will resume home agents and taper steroids quickly. Recommend close OP follow up to track control   5. Hyperlipidemia:Continue Pravastatin and Zetia    6. Essential hypertension: controlled.     7. Anemia: mild. Likely related to chronic disease.  Anemia panel with iron level 29, retic 3.8.   8. NICOTINE ADDICTION :Counseled Re: Smoking Cessation.    9. Schizophrenia, unspecified type: remained stable at baseline.  Procedures:  none  Consultations:  none  Discharge Exam: Filed Vitals:   01/20/15 0606  BP: 131/88  Pulse: 110  Temp: 98.3 F (36.8 C)  Resp: 20    General: well nourished appears comfortable Cardiovascular: RRR No Molina/g/r no LE edema Respiratory: normal effort. Able to complete sentences. Improved air flow with no wheeze. No rhonchi  Discharge Instructions    Current Discharge Medication List    START taking these medications   Details  benzonatate (TESSALON) 100 MG  capsule Take 1 capsule (100 mg total) by mouth 2 (two) times daily. Qty: 20 capsule, Refills: 0    ipratropium-albuterol (DUONEB) 0.5-2.5 (3) MG/3ML SOLN Take 3 mLs by nebulization every 4 (four) hours  as needed. Qty: 360 mL, Refills: 1    levofloxacin (LEVAQUIN) 750 MG tablet Take 1 tablet (750 mg total) by mouth daily. Qty: 2 tablet, Refills: 0    nicotine (NICODERM CQ - DOSED IN MG/24 HOURS) 21 mg/24hr patch Place 1 patch (21 mg total) onto the skin daily. Qty: 28 patch, Refills: 0    predniSONE (DELTASONE) 10 MG tablet Take 5 tabs on 01/21/15, then take 4 tabs for 1 day then take 3 tabs for 1 day then take 2 tabs for 1 day then take 1 tab for one day then stop. Qty: 15 tablet, Refills: 0      CONTINUE these medications which have NOT CHANGED   Details  albuterol (PROVENTIL HFA;VENTOLIN HFA) 108 (90 BASE) MCG/ACT inhaler Inhale 2 puffs into the lungs every 6 (six) hours as needed for wheezing or shortness of breath. Qty: 1 Inhaler, Refills: 2   Associated Diagnoses: COPD (chronic obstructive pulmonary disease)    amLODipine (NORVASC) 10 MG tablet TAKE (1) TABLET BY MOUTH DAILY. Qty: 30 tablet, Refills: 2    aspirin EC 81 MG tablet Take 81 mg by mouth daily.    budesonide-formoterol (SYMBICORT) 80-4.5 MCG/ACT inhaler Inhale 2 puffs into the lungs 2 (two) times daily. Qty: 10.2 g, Refills: 12   Associated Diagnoses: COPD (chronic obstructive pulmonary disease)    busPIRone (BUSPAR) 15 MG tablet TAKE 1 TABLET BY MOUTH ONCE DAILY. Qty: 30 tablet, Refills: 2    GLIPIZIDE XL 10 MG 24 hr tablet TAKE 1 TABLET BY MOUTH DAILY. Qty: 30 tablet, Refills: 2    metFORMIN (GLUCOPHAGE) 1000 MG tablet TAKE 1 TABLET BY MOUTH TWICE DAILY WITH FOOD FOR DIABETES. Qty: 60 tablet, Refills: 2    polyethylene glycol powder (GLYCOLAX/MIRALAX) powder Take 17 g by mouth daily. Qty: 500 g, Refills: 3    pravastatin (PRAVACHOL) 40 MG tablet TAKE (1) TABLET BY MOUTH AT BEDTIME FOR CHOLESTEROL. Qty: 30 tablet, Refills: 2    !! risperiDONE (RISPERDAL) 1 MG tablet Take 1 tablet (1 mg total) by mouth at bedtime. Qty: 30 tablet, Refills: 4    !! risperidone (RISPERDAL) 4 MG tablet TAKE (1) TABLET BY  MOUTH AT BEDTIME. Qty: 30 tablet, Refills: 4    tiotropium (SPIRIVA) 18 MCG inhalation capsule Place 18 mcg into inhaler and inhale daily.    triamterene-hydrochlorothiazide (MAXZIDE-25) 37.5-25 MG per tablet TAKE (1) TABLET BY MOUTH ONCE DAILY. Qty: 30 tablet, Refills: 2    valsartan (DIOVAN) 320 MG tablet TAKE ONE TABLET BY MOUTH DAILY. Qty: 30 tablet, Refills: 2    ZETIA 10 MG tablet TAKE 1 TABLET BY MOUTH ONCE DAILY. Qty: 30 tablet, Refills: 2    ACCU-CHEK AVIVA PLUS test strip TEST BLOOD SUGAR ONCE DAILY AS DIRECTED. Qty: 50 each, Refills: 4    ibuprofen (ADVIL,MOTRIN) 800 MG tablet TAKE ONE TABLET BY MOUTH EVERY 8 HOURS AS NEEDED FOR PAIN. MUST LAST 90 DAYS. Qty: 30 tablet, Refills: 0     !! - Potential duplicate medications found. Please discuss with provider.     Allergies  Allergen Reactions  . Sertraline Hcl     Stomach upset/pain   Follow-up Information    Follow up with Matthew Nakayama, MD.   Specialty:  Family Medicine   Contact information:   [phone]  [address] [place] [address] [phone]        The results of significant diagnostics from this hospitalization (including imaging, microbiology, ancillary and laboratory) are listed below for reference.    Significant Diagnostic Studies: Dg Chest Portable 1 View  01/17/2015   CLINICAL DATA:  Shortness of breath.  EXAM: PORTABLE CHEST - 1 VIEW  COMPARISON:  08/11/2012.  FINDINGS: Cardiopericardial silhouette within normal limits. Mediastinal contours normal. Trachea midline. No airspace disease or effusion. Monitoring leads project over the chest. Apical lordotic projection.  IMPRESSION: No active disease.   Electronically Signed   By: Dereck Ligas Molina.D.   On: 01/17/2015 17:21    Microbiology: No results found for this or any previous visit (from the past 240 hour(s)).   Labs: Basic Metabolic Panel:  Recent Labs Lab 01/17/15 1705 01/18/15 0551 01/20/15 0628  NA 130* 131* 133*  K 3.8  4.7 4.2  CL 100 97 99  CO2 22 20 24   GLUCOSE 82 192* 255*  BUN 14 17 24*  CREATININE 1.69* 1.58* 1.43*  CALCIUM 8.8 9.8 9.8   Liver Function Tests:  Recent Labs Lab 01/17/15 1705  AST 32  ALT 25  ALKPHOS 46  BILITOT 0.5  PROT 7.6  ALBUMIN 4.0   No results for input(s): LIPASE, AMYLASE in the last 168 hours. No results for input(s): AMMONIA in the last 168 hours. CBC:  Recent Labs Lab 01/17/15 1705 01/18/15 0551 01/20/15 0628  WBC 7.6 11.9* 18.0*  NEUTROABS 4.0  --   --   HGB 11.5* 12.4* 12.0*  HCT 34.7* 37.5* 36.2*  MCV 87.8 88.2 87.9  PLT 217 238 269   Cardiac Enzymes:  Recent Labs Lab 01/17/15 1705  TROPONINI <0.03   BNP: BNP (last 3 results)  Recent Labs  01/17/15 1705  BNP 16.0    ProBNP (last 3 results) No results for input(s): PROBNP in the last 8760 hours.  CBG:  Recent Labs Lab 01/19/15 1109 01/19/15 1636 01/19/15 2213 01/20/15 0743 01/20/15 1134  GLUCAP 337* 263* 322* 222* 296*       Signed:  Grabiel Schmutz Molina  [facility] 01/20/2015, 12:40 PM

## 2015-01-20 NOTE — Progress Notes (Signed)
   01/20/15 1200  Mobility  Activity Ambulate in hall  Level of Assistance Independent  Assistive Device Cane  Distance Ambulated (ft) 100 ft  Ambulation Response Tolerated well   Patient's O2 saturation is 95% on room air at rest. Patient's O2 saturation is 92% on room air with exertion.

## 2015-01-21 ENCOUNTER — Other Ambulatory Visit: Payer: Self-pay | Admitting: Family Medicine

## 2015-01-27 ENCOUNTER — Encounter: Payer: Self-pay | Admitting: Family Medicine

## 2015-01-27 ENCOUNTER — Ambulatory Visit (INDEPENDENT_AMBULATORY_CARE_PROVIDER_SITE_OTHER): Payer: Commercial Managed Care - HMO | Admitting: Family Medicine

## 2015-01-27 VITALS — BP 124/70 | HR 100 | Resp 18 | Ht 75.0 in | Wt 254.0 lb

## 2015-01-27 DIAGNOSIS — I10 Essential (primary) hypertension: Secondary | ICD-10-CM

## 2015-01-27 DIAGNOSIS — Z09 Encounter for follow-up examination after completed treatment for conditions other than malignant neoplasm: Secondary | ICD-10-CM

## 2015-01-27 DIAGNOSIS — E1165 Type 2 diabetes mellitus with hyperglycemia: Secondary | ICD-10-CM | POA: Diagnosis not present

## 2015-01-27 DIAGNOSIS — R0902 Hypoxemia: Secondary | ICD-10-CM

## 2015-01-27 DIAGNOSIS — E785 Hyperlipidemia, unspecified: Secondary | ICD-10-CM

## 2015-01-27 DIAGNOSIS — IMO0001 Reserved for inherently not codable concepts without codable children: Secondary | ICD-10-CM

## 2015-01-27 DIAGNOSIS — F209 Schizophrenia, unspecified: Secondary | ICD-10-CM

## 2015-01-27 DIAGNOSIS — E669 Obesity, unspecified: Secondary | ICD-10-CM

## 2015-01-27 DIAGNOSIS — E1169 Type 2 diabetes mellitus with other specified complication: Secondary | ICD-10-CM

## 2015-01-27 DIAGNOSIS — J9601 Acute respiratory failure with hypoxia: Secondary | ICD-10-CM

## 2015-01-27 DIAGNOSIS — Z720 Tobacco use: Secondary | ICD-10-CM

## 2015-01-27 DIAGNOSIS — F17200 Nicotine dependence, unspecified, uncomplicated: Secondary | ICD-10-CM

## 2015-01-27 DIAGNOSIS — E119 Type 2 diabetes mellitus without complications: Secondary | ICD-10-CM

## 2015-01-27 LAB — HM DIABETES EYE EXAM

## 2015-01-27 NOTE — Patient Instructions (Addendum)
Annual wellness  end March, call if you need me sooner  NO MORE CIGARETTES, you have the patch , and you need to quit   Blood sugar today after lunch is good, no change in your medcation  You are referred for overnight pulse ox, to see if you need oxygen at nig[place] while sleeping  Chem 7 and EGFR today  We will send for the eye exam you had today   Fasting lipid, cmp and EGFr and TSH and hBA1C end March

## 2015-01-27 NOTE — Progress Notes (Signed)
   Subjective:    Patient ID: Matthew Molina, male    DOB: 1951/03/28, 64 y.o.   MRN: [id number]  HPI Recently hospitalized for acute respiratory failure, now asymptomatic as far as respiratory symptoms are concerned and motivated highly o stop smoking. Needs overnight pulse oximetry   Review of Systems See HPI Denies recent fever or chills. Denies sinus pressure, nasal congestion, ear pain or sore throat. Denies chest congestion, productive cough or wheezing. Denies chest pains, palpitations and leg swelling Denies abdominal pain, nausea, vomiting,diarrhea or constipation.   Denies dysuria, frequency, hesitancy or incontinence. Denies joint pain, swelling and limitation in mobility. Denies headaches, seizures, numbness, or tingling. Denies depression, anxiety or insomnia. Denies skin break down or rash.        Objective:   Physical Exam BP 124/70 mmHg  Pulse 100  Resp 18  Ht 6\' 3"  (1.905 m)  Wt 254 lb (115.214 kg)  BMI 31.75 kg/m2  SpO2 97% Patient alert and oriented and in no cardiopulmonary distress.  HEENT: No facial asymmetry, EOMI,   oropharynx pink and moist.  Neck supple no JVD, no mass.  Chest: Clear to auscultation bilaterally.Decreased though adequate air entry, no crackles or wheezes  CVS: S1, S2 no murmurs, no S3.Regular rate.  ABD: Soft non tender.   Ext: No edema  MS: Adequate ROM spine, shoulders, hips and knees.  Skin: Intact, no ulcerations or rash noted.  Psych: Good eye contact, normal affect. Memory intact not anxious or depressed appearing.  CNS: CN 2-12 intact, power,  normal throughout.no focal deficits noted.        Assessment & Plan:  Acute respiratory failure with hypoxia Recently hospitalized with this diagnosis. Breatrhing is improved, need to be rested for need for nocturnal oxygen. Currently using nicotine patch, has smoked 2  Cigarettes advised against this , he understands the danger and will stop.Motivated currently to  quit nicotine   Hypoxia Normal oxygen  On room air will test for nocturnal hypoxia   Diabetes mellitus type 2 in obese Controlled, no change in medication Updated lab needed at/ before next visit. Recent inc in blood sugar due to steroids to treat acute COPD flare, will monitor   Schizophrenia, unspecified type Controlled, no change in medication    NICOTINE ADDICTION Using nicotineaaptches in 01/2015 following recent acute respiratory failure, has smoked  With patch 2 ciggs re educated not to do this Patient counseled for approximately 5 minutes regarding the health risks of ongoing nicotine use, specifically all types of cancer, heart disease, stroke and respiratory failure. The options available for help with cessation ,the behavioral changes to assist the process, and the option to either gradully reduce usage  Or abruptly stop.is also discussed. Pt is also encouraged to set specific goals in number of cigarettes used daily, as well as to set a quit date.    Obesity (BMI 30.0-34.9) Unchanged Patient re-educated about  the importance of commitment to a  minimum of 150 minutes of exercise per week. The importance of healthy food choices with portion control discussed. Encouraged to start a food diary, count calories and to consider  joining a support group. Sample diet sheets offered. Goals set by the patient for the next several months.      Hospital discharge follow-up Hospitalized end Jan for acute respiratory failure due to COPD exacerbation, moitvated highly to quit nicotine Denies excessive dyspnea , cough or wheeze currently

## 2015-01-28 LAB — COMPLETE METABOLIC PANEL WITH GFR
ALT: 53 U/L (ref 0–53)
AST: 18 U/L (ref 0–37)
Albumin: 4.1 g/dL (ref 3.5–5.2)
Alkaline Phosphatase: 51 U/L (ref 39–117)
BUN: 14 mg/dL (ref 6–23)
CO2: 24 mEq/L (ref 19–32)
Calcium: 9.5 mg/dL (ref 8.4–10.5)
Chloride: 98 mEq/L (ref 96–112)
Creat: 1.2 mg/dL (ref 0.50–1.35)
GFR, Est African American: 73 mL/min
GFR, Est Non African American: 63 mL/min
Glucose, Bld: 131 mg/dL — ABNORMAL HIGH (ref 70–99)
Potassium: 4.1 mEq/L (ref 3.5–5.3)
Sodium: 134 mEq/L — ABNORMAL LOW (ref 135–145)
Total Bilirubin: 0.6 mg/dL (ref 0.2–1.2)
Total Protein: 6.8 g/dL (ref 6.0–8.3)

## 2015-01-31 DIAGNOSIS — Z09 Encounter for follow-up examination after completed treatment for conditions other than malignant neoplasm: Secondary | ICD-10-CM | POA: Insufficient documentation

## 2015-01-31 NOTE — Assessment & Plan Note (Signed)
Controlled, no change in medication  

## 2015-01-31 NOTE — Assessment & Plan Note (Signed)
Recently hospitalized with this diagnosis. Breatrhing is improved, need to be rested for need for nocturnal oxygen. Currently using nicotine patch, has smoked 2  Cigarettes advised against this , he understands the danger and will stop.Motivated currently to quit nicotine

## 2015-01-31 NOTE — Assessment & Plan Note (Signed)
Unchanged. Patient re-educated about  the importance of commitment to a  minimum of 150 minutes of exercise per week. The importance of healthy food choices with portion control discussed. Encouraged to start a food diary, count calories and to consider  joining a support group. Sample diet sheets offered. Goals set by the patient for the next several months.    

## 2015-01-31 NOTE — Assessment & Plan Note (Signed)
Controlled, no change in medication Updated lab needed at/ before next visit. Recent inc in blood sugar due to steroids to treat acute COPD flare, will monitor

## 2015-01-31 NOTE — Assessment & Plan Note (Signed)
Using nicotineaaptches in 01/2015 following recent acute respiratory failure, has smoked  With patch 2 ciggs re educated not to do this Patient counseled for approximately 5 minutes regarding the health risks of ongoing nicotine use, specifically all types of cancer, heart disease, stroke and respiratory failure. The options available for help with cessation ,the behavioral changes to assist the process, and the option to either gradully reduce usage  Or abruptly stop.is also discussed. Pt is also encouraged to set specific goals in number of cigarettes used daily, as well as to set a quit date.

## 2015-01-31 NOTE — Assessment & Plan Note (Signed)
Hospitalized end Jan for acute respiratory failure due to COPD exacerbation, moitvated highly to quit nicotine Denies excessive dyspnea , cough or wheeze currently

## 2015-01-31 NOTE — Assessment & Plan Note (Signed)
Normal oxygen  On room air will test for nocturnal hypoxia

## 2015-02-08 ENCOUNTER — Telehealth: Payer: Self-pay | Admitting: Family Medicine

## 2015-02-08 NOTE — Telephone Encounter (Signed)
Pls contact pt and supplier, based on overnight study,he DOES qualify for supplemental oxygen at 2 liter/min, pls attend to necessary paper work so that this can be completed by Thursday am (if possible)  Paper is in your area

## 2015-02-11 NOTE — Telephone Encounter (Signed)
Pt aware and order sent in

## 2015-02-22 ENCOUNTER — Other Ambulatory Visit: Payer: Self-pay | Admitting: Family Medicine

## 2015-03-22 ENCOUNTER — Other Ambulatory Visit: Payer: Self-pay | Admitting: Family Medicine

## 2015-03-30 ENCOUNTER — Telehealth: Payer: Self-pay | Admitting: *Deleted

## 2015-03-30 DIAGNOSIS — E1169 Type 2 diabetes mellitus with other specified complication: Secondary | ICD-10-CM

## 2015-03-30 DIAGNOSIS — E669 Obesity, unspecified: Secondary | ICD-10-CM

## 2015-03-30 NOTE — Telephone Encounter (Signed)
Pt called about a referral to [facility] in [place]. Please advise

## 2015-03-30 NOTE — Telephone Encounter (Signed)
Referred as requested.

## 2015-03-30 NOTE — Addendum Note (Signed)
Addended by: Eual Fines on: 03/30/2015 04:06 PM   Modules accepted: Orders

## 2015-04-01 ENCOUNTER — Ambulatory Visit (INDEPENDENT_AMBULATORY_CARE_PROVIDER_SITE_OTHER): Payer: Commercial Managed Care - HMO | Admitting: Family Medicine

## 2015-04-01 ENCOUNTER — Encounter: Payer: Self-pay | Admitting: Family Medicine

## 2015-04-01 VITALS — BP 128/72 | HR 87 | Resp 16 | Ht 73.0 in | Wt 255.0 lb

## 2015-04-01 DIAGNOSIS — Z125 Encounter for screening for malignant neoplasm of prostate: Secondary | ICD-10-CM

## 2015-04-01 DIAGNOSIS — R918 Other nonspecific abnormal finding of lung field: Secondary | ICD-10-CM

## 2015-04-01 DIAGNOSIS — F209 Schizophrenia, unspecified: Secondary | ICD-10-CM

## 2015-04-01 DIAGNOSIS — E1169 Type 2 diabetes mellitus with other specified complication: Secondary | ICD-10-CM

## 2015-04-01 DIAGNOSIS — E785 Hyperlipidemia, unspecified: Secondary | ICD-10-CM

## 2015-04-01 DIAGNOSIS — F32A Depression, unspecified: Secondary | ICD-10-CM

## 2015-04-01 DIAGNOSIS — E669 Obesity, unspecified: Secondary | ICD-10-CM

## 2015-04-01 DIAGNOSIS — R296 Repeated falls: Secondary | ICD-10-CM

## 2015-04-01 DIAGNOSIS — F17200 Nicotine dependence, unspecified, uncomplicated: Secondary | ICD-10-CM

## 2015-04-01 DIAGNOSIS — Z0000 Encounter for general adult medical examination without abnormal findings: Secondary | ICD-10-CM | POA: Insufficient documentation

## 2015-04-01 DIAGNOSIS — Z136 Encounter for screening for cardiovascular disorders: Secondary | ICD-10-CM

## 2015-04-01 DIAGNOSIS — I10 Essential (primary) hypertension: Secondary | ICD-10-CM

## 2015-04-01 DIAGNOSIS — F329 Major depressive disorder, single episode, unspecified: Secondary | ICD-10-CM

## 2015-04-01 DIAGNOSIS — R972 Elevated prostate specific antigen [PSA]: Secondary | ICD-10-CM

## 2015-04-01 DIAGNOSIS — Z720 Tobacco use: Secondary | ICD-10-CM

## 2015-04-01 DIAGNOSIS — E119 Type 2 diabetes mellitus without complications: Secondary | ICD-10-CM

## 2015-04-01 DIAGNOSIS — I2510 Atherosclerotic heart disease of native coronary artery without angina pectoris: Secondary | ICD-10-CM

## 2015-04-01 NOTE — Patient Instructions (Signed)
F/u in 4 month, call if you need me before  You need fasting lipid, cmp and EGFr , hBa1C, TSH and PSA as soon as possible , the heart Doc will need some of this info  You are referred to cardiologist , you need to follow through on the test recommended this time to make sure you have no blockages  Pls work on cutting back smoking and quitting  You are referred to psychiatrist  Please de cllutter home so you are less likely to fall

## 2015-04-01 NOTE — Progress Notes (Signed)
Subjective:    Patient ID: Matthew Molina, male    DOB: Dec 12, 1951, 64 y.o.   MRN: [id number]  HPI Preventive Screening-Counseling & Management   Patient present here today for a subsequent  Medicare annual wellness visit.   Current Problems (verified)   Medications Prior to Visit Allergies (verified)   PAST HISTORY  Family History (verified)   Social History  Divorced, with 2 daughters. Currently lives alone, disabled    Risk Factors  Current exercise habits:  Doesn't exercise at this time , limited by mobility issues, describes lower extremity weakness and has ahd falls, will attempt chair exercises and stretches  Dietary issues discussed: Discussed limiting fried fatty foods and red meat , also c starchy foods and sweets, limiting portion size    Cardiac risk factors: sister heart disease in her 22's , pt has diabetes, HTN, hyperlipidemia is obese and smokes, has CAD on chest scan did not follow through on cardiology recommendation for testing  Depression Screen  (Note: if answer to either of the following is "Yes", a more complete depression screening is indicated)   Over the past two weeks, have you felt down, depressed or hopeless? Yes   Over the past two weeks, have you felt little interest or pleasure in doing things? Some   Have you lost interest or pleasure in daily life? No  Do you often feel hopeless? No  Do you cry easily over simple problems? No   Activities of Daily Living  In your present state of health, do you have any difficulty performing the following activities?  Driving?: sometimes  Managing money?: No Feeding yourself?:No Getting from bed to chair?: uses walker to get around  Climbing a flight of stairs?: haven't tried lately  Preparing food and eating?: food gets prepared for him by his neighbor  Bathing or showering?: states he is going to get a shower chair  Getting dressed?:No Getting to the toilet?:No Using the toilet?:No Moving around  from place to place?: uses a walker to get around   Fall Risk Assessment In the past year have you fallen or had a near fall?: 2 or more  Are you currently taking any medications that make you dizzy?: doesn't think so    Hearing Difficulties: No Do you often ask people to speak up or repeat themselves?:No Do you experience ringing or noises in your ears?:No Do you have difficulty understanding soft or whispered voices?:No  Cognitive Testing  Alert? Yes Normal Appearance?Yes  Oriented to person? Yes Place? Yes  Time? Yes  Displays appropriate judgment?Yes  Can read the correct time from a watch face? yes Are you having problems remembering things? Sometimes   Advanced Directives have been discussed with the patient?Yes, doesn't have a living will , I discussed further with him, he is a  full code    List the Names of Other Physician/Practitioners you currently use:  [facility] (pulmonary)   Indicate any recent Medical Services you may have received from other than [facility] providers in the past year (date may be approximate).   Assessment:    Annual Wellness Exam   Plan:     Medicare Attestation  I have personally reviewed:  The patient's medical and social history  Their use of alcohol, tobacco or illicit drugs  Their current medications and supplements  The patient's functional ability including ADLs,fall risks, home safety risks, cognitive, and hearing and visual impairment  Diet and physical activities  Evidence for depression or mood disorders  The  patient's weight, height, BMI, and visual acuity have been recorded in the chart. I have made referrals, counseling, and provided education to the patient based on review of the above and I have provided the patient with a written personalized care plan for preventive services.      Review of Systems     Objective:   Physical Exam BP 128/72 mmHg  Pulse 87  Resp 16  Ht 6\' 1"  (1.854 m)  Wt 255 lb (115.667 kg)  BMI 33.65  kg/m2  SpO2 97%        Assessment & Plan:  Medicare annual wellness visit, subsequent Annual exam as documented. Counseling done  re healthy lifestyle involving commitment to 150 minutes exercise per week, heart healthy diet, and attaining healthy weight.The importance of adequate sleep also discussed. Regular seat belt use and home safety, is also discussed. Changes in health habits are decided on by the patient with goals and time frames  set for achieving them. Immunization and cancer screening needs are specifically addressed at this visit.     Coronary atherosclerosis of native coronary artery Abnormal chest scan in 2014, multiple CV risk factors, stress test recommended, pt did not follow up , but intends to do so now, will refer to cardiology again. Nicotine cessation counseling doen, he is up to 2 PPD, an has smokes at least 1 PPd since age 42

## 2015-04-01 NOTE — Assessment & Plan Note (Signed)

## 2015-04-02 DIAGNOSIS — R918 Other nonspecific abnormal finding of lung field: Secondary | ICD-10-CM

## 2015-04-02 DIAGNOSIS — I2510 Atherosclerotic heart disease of native coronary artery without angina pectoris: Secondary | ICD-10-CM | POA: Insufficient documentation

## 2015-04-02 DIAGNOSIS — R296 Repeated falls: Secondary | ICD-10-CM | POA: Insufficient documentation

## 2015-04-02 HISTORY — DX: Other nonspecific abnormal finding of lung field: R91.8

## 2015-04-02 NOTE — Assessment & Plan Note (Signed)
Abnormal chest scan in 2014, multiple CV risk factors, stress test recommended, pt did not follow up , but intends to do so now, will refer to cardiology again. Nicotine cessation counseling doen, he is up to 2 PPD, an has smokes at least 1 PPd since age 64

## 2015-04-07 ENCOUNTER — Ambulatory Visit (HOSPITAL_COMMUNITY)
Admission: RE | Admit: 2015-04-07 | Discharge: 2015-04-07 | Disposition: A | Discharge status: Home / Self Care | Payer: Commercial Managed Care - HMO | Source: Ambulatory Visit | Attending: Family Medicine | Admitting: Family Medicine

## 2015-04-07 DIAGNOSIS — Z136 Encounter for screening for cardiovascular disorders: Secondary | ICD-10-CM | POA: Insufficient documentation

## 2015-04-07 DIAGNOSIS — Z720 Tobacco use: Secondary | ICD-10-CM | POA: Insufficient documentation

## 2015-04-07 DIAGNOSIS — F17200 Nicotine dependence, unspecified, uncomplicated: Secondary | ICD-10-CM

## 2015-04-07 NOTE — Progress Notes (Signed)
Patient ID: Matthew Molina, male   DOB: 1951/07/30, 64 y.o.   MRN: [id number]     Cardiology Office Note   Date:  04/07/2015   ID:  Matthew Molina, DOB 1951-10-19, MRN [id number]  PCP:  [name], MD  Cardiologist:   Jenkins Rouge, MD   No chief complaint on file.     History of Present Illness: Matthew Molina is a 64 y.o. male who presents for evaluation of CAD.  CRF;s HTN , elevated lipids, smoking and DM.  Over 45 pack year smoking history and enrolled in CT low dose lung screening.  Reviewed CT from 2015 and radiologist indicated calcific coronary disease in all 3 arteries Two my review not age advanced with small punctate lesions mostly in mid LAD Had a normal myovue in 2004 with Dr Wilhemina Cash in [facility] with walker to support legs.  On oxygen at home Not clear for how long.  Exertional dyspnea no chest pain.  Seeing new psychiatrist in May  But Schizophrenia seems compensated.      Past Medical History  Diagnosis Date  . Nicotine addiction   . Schizophrenia   . Hyperlipidemia   . Obesity   . Hypertension   . Diabetes mellitus   . COPD (chronic obstructive pulmonary disease)   . Arthritis   . Acute respiratory failure with hypoxia   . Acute kidney injury     Past Surgical History  Procedure Laterality Date  . Colonoscopy    . Colonoscopy N/A 04/01/2013    Procedure: COLONOSCOPY;  Surgeon: Danie Binder, MD;  Location: [facility];  Service: Endoscopy;  Laterality: N/A;  10:00 AM-moved to [place] notified pt  . Cataract extraction w/phaco Left 11/20/2013    Procedure: CATARACT EXTRACTION PHACO AND INTRAOCULAR LENS PLACEMENT (IOC);  Surgeon: Tonny Branch, MD;  Location: [facility] ORS;  Service: Ophthalmology;  Laterality: Left;  CDE:10.26  . Cataract extraction w/phaco Right 12/08/2013    Procedure: RIGHT EYE CATARACT EXTRACTION PHACO AND INTRAOCULAR LENS PLACEMENT ;  Surgeon: Tonny Branch, MD;  Location: [facility] ORS;  Service: Ophthalmology;  Laterality: Right;  CDE 12.38   . Eye surgery Left 11/2013    cataract extraction     Current Outpatient Prescriptions  Medication Sig Dispense Refill  . ACCU-CHEK AVIVA PLUS test strip TEST BLOOD SUGAR ONCE DAILY AS DIRECTED. 50 each 4  . albuterol (PROVENTIL HFA;VENTOLIN HFA) 108 (90 BASE) MCG/ACT inhaler Inhale 2 puffs into the lungs every 6 (six) hours as needed for wheezing or shortness of breath. 1 Inhaler 2  . amLODipine (NORVASC) 10 MG tablet TAKE (1) TABLET BY MOUTH DAILY. 30 tablet 2  . aspirin EC 81 MG tablet Take 81 mg by mouth daily.    . busPIRone (BUSPAR) 15 MG tablet TAKE 1 TABLET BY MOUTH ONCE DAILY. 30 tablet 2  . GLIPIZIDE XL 10 MG 24 hr tablet TAKE 1 TABLET BY MOUTH DAILY. 30 tablet 2  . ipratropium-albuterol (DUONEB) 0.5-2.5 (3) MG/3ML SOLN Take 3 mLs by nebulization every 4 (four) hours as needed. 360 mL 1  . metFORMIN (GLUCOPHAGE) 1000 MG tablet TAKE 1 TABLET BY MOUTH TWICE DAILY WITH FOOD FOR DIABETES. 60 tablet 2  . polyethylene glycol powder (GLYCOLAX/MIRALAX) powder Take 17 g by mouth daily. 500 g 3  . pravastatin (PRAVACHOL) 40 MG tablet TAKE (1) TABLET BY MOUTH AT BEDTIME FOR CHOLESTEROL. 30 tablet 2  . risperiDONE (RISPERDAL) 1 MG tablet Take 1 tablet (1 mg total) by mouth at  bedtime. 30 tablet 4  . risperidone (RISPERDAL) 4 MG tablet TAKE (1) TABLET BY MOUTH AT BEDTIME. 30 tablet 4  . SPIRIVA HANDIHALER 18 MCG inhalation capsule INHALE 1 CAPSULE AS DIRECTED ONCE A DAY. 30 capsule 4  . SYMBICORT 80-4.5 MCG/ACT inhaler INHALE 2 PUFFS INTO LUNGS TWICE DAILY. 10.2 g 2  . triamterene-hydrochlorothiazide (MAXZIDE-25) 37.5-25 MG per tablet TAKE (1) TABLET BY MOUTH ONCE DAILY. 30 tablet 2  . valsartan (DIOVAN) 320 MG tablet TAKE ONE TABLET BY MOUTH DAILY. 30 tablet 2  . ZETIA 10 MG tablet TAKE 1 TABLET BY MOUTH ONCE DAILY. 30 tablet 2  . [DISCONTINUED] sildenafil (VIAGRA) 100 MG tablet Take 100 mg by mouth. Take one tablet 30 mins before intercourse      Current Facility-Administered Medications    Medication Dose Route Frequency Provider Last Rate Last Dose  . Influenza (>/= 3 years) inactive virus vaccine (FLVIRIN/FLUZONE) injection SUSP 0.5 mL  0.5 mL Intramuscular Once Fayrene Helper, MD        Allergies:   Sertraline hcl    Social History:  The patient  reports that he has been smoking Cigarettes.  He has a 100 pack-year smoking history. He has never used smokeless tobacco. He reports that he does not drink alcohol or use illicit drugs.   Family History:  The patient's family history includes Diabetes in his mother and sister; Heart disease in his sister; Hypertension in his mother; Kidney disease in his father; Stroke in his mother.    ROS:  Please see the history of present illness.   Otherwise, review of systems are positive for none.   All other systems are reviewed and negative.    PHYSICAL EXAM: VS:  There were no vitals taken for this visit. , BMI There is no weight on file to calculate BMI. Affect appropriate Chronically ill black male with walker  HEENT: normal Neck supple with no adenopathy JVP normal no bruits no thyromegaly Lungs poor air motion and no  wheezing and good diaphragmatic motion Heart:  S1/S2 no murmur, no rub, gallop or click PMI normal Abdomen: benighn, BS positve, no tenderness, no AAA no bruit.  No HSM or HJR Distal pulses intact with no bruits No edema Neuro non-focal Skin warm and dry No muscular weakness    EKG:  01/18/15  ST rate 104  LAFB  Poor R wave progression nonspecific ST changes    Recent Labs: 01/17/2015: B Natriuretic Peptide 16.0 01/20/2015: Hemoglobin 12.0*; Platelets 269 01/27/2015: ALT 53; BUN 14; Creatinine 1.20; Potassium 4.1; Sodium 134*    Lipid Panel    Component Value Date/Time   CHOL 124 12/09/2014 0803   TRIG 181* 12/09/2014 0803   HDL 31* 12/09/2014 0803   CHOLHDL 4.0 12/09/2014 0803   VLDL 36 12/09/2014 0803   LDLCALC 57 12/09/2014 0803      Wt Readings from Last 3 Encounters:  04/01/15 255 lb  (115.667 kg)  01/27/15 254 lb (115.214 kg)  01/17/15 250 lb (113.399 kg)      Other studies Reviewed: Additional studies/ records that were reviewed today include: Epic notes.    ASSESSMENT AND PLAN:  1.  CAD:  Seen on CT scan for lung screeing  Multiple CRF;s  And exertional dsypnea with abnormal ECG  F/U Lexiscan myovue 2. COPD:  conitnue oxygen and inhalers  F/u [place] 3. HTN:  Well controlled.  Continue current medications and low sodium Dash type diet.   4. DM Discussed low carb diet.  Target  hemoglobin A1c is 6.5 or less.  Continue current medications. 5. Leg weakness:  Pulses are ok consider w/u for spinal stenosis per primary     Current medicines are reviewed at length with the patient today.  The patient does not have concerns regarding medicines.  The following changes have been made:  no change  Labs/ tests ordered today include: Leixscan myovue   No orders of the defined types were placed in this encounter.     Disposition:   FU with [place] office PRN     Signed, Jenkins Rouge, MD  04/07/2015 10:04 PM    [address] [facility], [place], [place]  [address] Phone: [phone]; Fax: [phone]

## 2015-04-08 ENCOUNTER — Encounter: Payer: Self-pay | Admitting: *Deleted

## 2015-04-08 ENCOUNTER — Encounter: Payer: Self-pay | Admitting: Cardiovascular Disease

## 2015-04-08 ENCOUNTER — Ambulatory Visit (INDEPENDENT_AMBULATORY_CARE_PROVIDER_SITE_OTHER): Payer: Commercial Managed Care - HMO | Admitting: Cardiovascular Disease

## 2015-04-08 VITALS — BP 112/70 | HR 102 | Ht 73.0 in | Wt 253.8 lb

## 2015-04-08 DIAGNOSIS — I2510 Atherosclerotic heart disease of native coronary artery without angina pectoris: Secondary | ICD-10-CM | POA: Diagnosis not present

## 2015-04-08 DIAGNOSIS — I2583 Coronary atherosclerosis due to lipid rich plaque: Principal | ICD-10-CM

## 2015-04-08 NOTE — Patient Instructions (Signed)
Your physician recommends that you schedule a follow-up appointment in: As Needed   Your physician recommends that you continue on your current medications as directed. Please refer to the Current Medication list given to you today.  Your physician has requested that you have a lexiscan myoview. For further information please visit [email or web address]. Please follow instruction sheet, as given.  Thank you for choosing [facility]!

## 2015-04-08 NOTE — Addendum Note (Signed)
Addended by: Levonne Hubert on: 04/08/2015 12:44 PM   Modules accepted: Orders, Level of Service

## 2015-04-19 ENCOUNTER — Other Ambulatory Visit: Payer: Self-pay | Admitting: Family Medicine

## 2015-04-23 ENCOUNTER — Other Ambulatory Visit: Payer: Self-pay | Admitting: Cardiovascular Disease

## 2015-04-23 ENCOUNTER — Encounter (HOSPITAL_COMMUNITY): Payer: Self-pay

## 2015-04-23 ENCOUNTER — Encounter (HOSPITAL_COMMUNITY)
Admission: RE | Admit: 2015-04-23 | Discharge: 2015-04-23 | Disposition: A | Discharge status: Home / Self Care | Payer: Commercial Managed Care - HMO | Source: Ambulatory Visit | Attending: Cardiovascular Disease | Admitting: Cardiovascular Disease

## 2015-04-23 ENCOUNTER — Ambulatory Visit (HOSPITAL_COMMUNITY)
Admission: RE | Admit: 2015-04-23 | Discharge: 2015-04-23 | Disposition: A | Discharge status: Home / Self Care | Payer: Commercial Managed Care - HMO | Source: Ambulatory Visit | Attending: Cardiovascular Disease | Admitting: Cardiovascular Disease

## 2015-04-23 DIAGNOSIS — Z720 Tobacco use: Secondary | ICD-10-CM | POA: Insufficient documentation

## 2015-04-23 DIAGNOSIS — I2510 Atherosclerotic heart disease of native coronary artery without angina pectoris: Secondary | ICD-10-CM | POA: Insufficient documentation

## 2015-04-23 DIAGNOSIS — I2583 Coronary atherosclerosis due to lipid rich plaque: Secondary | ICD-10-CM

## 2015-04-23 LAB — MYOCARDIAL PERFUSION IMAGING
LV dias vol: 81 mL
LV sys vol: 26 mL
Nuc Stress EF: 68 %
Peak HR: 103 {beats}/min
Rest HR: 81 {beats}/min
SDS: 3
SRS: 4
SSS: 5
TID: 1.15

## 2015-04-23 LAB — NM MYOCAR MULTI W/SPECT W/WALL MOTION / EF
LV dias vol: 81 mL
LV sys vol: 26 mL
RATE: 0.32
SDS: 3
SRS: 4
SSS: 5
TID: 1.15

## 2015-04-23 MED ORDER — SODIUM CHLORIDE 0.9 % IJ SOLN
10.0000 mL | INTRAMUSCULAR | Status: DC | PRN
  Administered 2015-04-23: 10 mL via INTRAVENOUS
  Filled 2015-04-23: qty 10

## 2015-04-23 MED ORDER — SODIUM CHLORIDE 0.9 % IJ SOLN
INTRAMUSCULAR | Status: AC
  Administered 2015-04-23: 10 mL via INTRAVENOUS
  Filled 2015-04-23: qty 3

## 2015-04-23 MED ORDER — REGADENOSON 0.4 MG/5ML IV SOLN
INTRAVENOUS | Status: AC
  Administered 2015-04-23: 0.4 mg via INTRAVENOUS
  Filled 2015-04-23: qty 5

## 2015-04-23 MED ORDER — REGADENOSON 0.4 MG/5ML IV SOLN
0.4000 mg | Freq: Once | INTRAVENOUS | Status: AC
  Administered 2015-04-23: 0.4 mg via INTRAVENOUS

## 2015-04-23 MED ORDER — TECHNETIUM TC 99M SESTAMIBI GENERIC - CARDIOLITE
10.0000 | Freq: Once | INTRAVENOUS | Status: AC | PRN
  Administered 2015-04-23: 10 via INTRAVENOUS

## 2015-04-23 MED ORDER — TECHNETIUM TC 99M SESTAMIBI - CARDIOLITE
30.0000 | Freq: Once | INTRAVENOUS | Status: AC | PRN
  Administered 2015-04-23: 10:00:00 30 via INTRAVENOUS

## 2015-04-23 NOTE — Progress Notes (Signed)
   Subjective:    Patient ID: Matthew Molina, male    DOB: [birth year], 64 y.o.   MRN: [id number]  HPI    Review of Systems     Objective:   Physical Exam        Assessment & Plan:

## 2015-05-06 ENCOUNTER — Encounter (HOSPITAL_COMMUNITY): Payer: Self-pay | Admitting: Psychiatry

## 2015-05-06 ENCOUNTER — Ambulatory Visit (INDEPENDENT_AMBULATORY_CARE_PROVIDER_SITE_OTHER): Payer: Medicare HMO | Admitting: Psychiatry

## 2015-05-06 VITALS — BP 103/63 | HR 98 | Ht 73.0 in | Wt 251.0 lb

## 2015-05-06 DIAGNOSIS — F200 Paranoid schizophrenia: Secondary | ICD-10-CM

## 2015-05-06 DIAGNOSIS — F259 Schizoaffective disorder, unspecified: Secondary | ICD-10-CM

## 2015-05-06 MED ORDER — RISPERIDONE 4 MG PO TABS: 4.0000 mg | ORAL_TABLET | Freq: Every day | ORAL | Status: DC

## 2015-05-06 MED ORDER — BUPROPION HCL ER (XL) 150 MG PO TB24: 150.0000 mg | ORAL_TABLET | ORAL | Status: DC

## 2015-05-06 MED ORDER — RISPERIDONE 1 MG PO TABS: 1.0000 mg | ORAL_TABLET | Freq: Every day | ORAL | Status: DC

## 2015-05-06 MED ORDER — BUSPIRONE HCL 15 MG PO TABS: 15.0000 mg | ORAL_TABLET | Freq: Every day | ORAL | Status: DC

## 2015-05-06 NOTE — Progress Notes (Signed)
Psychiatric Assessment Adult  Patient Identification:  Matthew Molina Date of Evaluation:  05/06/2015 Chief Complaint: "My medicines aren't working right." History of Chief Complaint:   Chief Complaint  Patient presents with  . Paranoid    HPI this patient is a 64 year old divorced black male who lives alone in [place]. He has 2 daughters but they do not live in the area. He used to work in a [employer] but has been on disability since 1996 for schizophrenia.  The patient was referred by his primary physician, Dr. [name], for further assessment and treatment of schizophrenia.  The patient is a poor historian but thinks he became mentally ill sometime in his 21s. In his 75s he used a lot of marijuana pills and LSD which may have contributed to the mental illness. He also drank quite a bit of alcohol. Around age 69 however he "got saved" and stopped all of this.  Apparently his symptoms started when he worked in [employer]. He began hearing voices and later became increasingly paranoid. He used to go to treatment at the [facility] and later at [facility] and families. He doesn't remember the medications that he's taken over the years. He's been on Risperdal for quite a while and last year his symptoms worsened and had to be increased from 4-5 mg at bedtime. He has been only hospitalized once at [facility] years ago after he was involuntarily committed by a psychiatrist at the [facility].  Currently the patient states that he still hears a little bit of voices in the evenings. They're telling him to drink or gamble but he doesn't listen to them. He denies being paranoid but sometimes feels like he is being watched. He is most concerned because he is getting more depressed. His mood is low and he is more withdrawn than he used to be. He reads his Bible but doesn't go to church like he did before. His energy is low but he is eating and sleeping  well. He is anxious but [name] put him on BuSpar which is helped to some degree. He denies overt panic attacks He doesn't have any thoughts of hurting self or others. He is fairly compliant with his medicines but sometimes forgets and has to have his neighbor help him. He tends to stay isolated. He does not use any drugs or alcohol currently Review of Systems  Constitutional: Positive for activity change.  HENT: Negative.   Eyes: Negative.   Respiratory: Negative.   Cardiovascular: Negative.   Gastrointestinal: Negative.   Endocrine: Negative.   Genitourinary: Negative.   Musculoskeletal: Positive for joint swelling, arthralgias and gait problem.  Skin: Negative.   Allergic/Immunologic: Negative.   Neurological: Negative.   Hematological: Negative.   Psychiatric/Behavioral: Positive for hallucinations and dysphoric mood. The patient is nervous/anxious.    Physical Exam not done  Depressive Symptoms: depressed mood, anhedonia, psychomotor retardation, anxiety, loss of energy/fatigue,  (Hypo) Manic Symptoms:   Elevated Mood:  No Irritable Mood:  No Grandiosity:  No Distractibility:  No Labiality of Mood:  No Delusions:  Yes Hallucinations:  Yes Impulsivity:  No Sexually Inappropriate Behavior:  No Financial Extravagance:  No Flight of Ideas:  No  Anxiety Symptoms: Excessive Worry:  Yes Panic Symptoms:  No Agoraphobia:  No Obsessive Compulsive: No  Symptoms: None, Specific Phobias:  No Social Anxiety:  No  Psychotic Symptoms:  Hallucinations: Yes Auditory Delusions:  Yes Paranoia:  Yes   Ideas of Reference:  No  PTSD Symptoms: Ever had a traumatic exposure:  No Had a traumatic exposure in the last month:  No Re-experiencing: No None Hypervigilance:  No Hyperarousal: No None Avoidance: No None  Traumatic Brain Injury: Yes fell down while playing as a child  Past Psychiatric History: Diagnosis: Schizophrenia   Hospitalizations: Once in the past at [facility]: For many years at [facility]: none  Self-Mutilation: none  Suicidal Attempts: none  Violent Behaviors: none   Past Medical History:   Past Medical History  Diagnosis Date  . Nicotine addiction   . Schizophrenia   . Hyperlipidemia   . Obesity   . Hypertension   . Diabetes mellitus   . COPD (chronic obstructive pulmonary disease)   . Arthritis   . Acute respiratory failure with hypoxia   . Acute kidney injury    History of Loss of Consciousness:  No Seizure History:  No Cardiac History:  No Allergies:   Allergies  Allergen Reactions  . Sertraline Hcl     Stomach upset/pain   Current Medications:  Current Outpatient Prescriptions  Medication Sig Dispense Refill  . ACCU-CHEK AVIVA PLUS test strip TEST BLOOD SUGAR ONCE DAILY AS DIRECTED. 50 each 4  . albuterol (PROVENTIL HFA;VENTOLIN HFA) 108 (90 BASE) MCG/ACT inhaler Inhale 2 puffs into the lungs every 6 (six) hours as needed for wheezing or shortness of breath. 1 Inhaler 2  . amLODipine (NORVASC) 10 MG tablet TAKE (1) TABLET BY MOUTH DAILY. 30 tablet 2  . aspirin EC 81 MG tablet Take 81 mg by mouth daily.    . busPIRone (BUSPAR) 15 MG tablet Take 1 tablet (15 mg total) by mouth daily. 30 tablet 2  . GLIPIZIDE XL 10 MG 24 hr tablet TAKE 1 TABLET BY MOUTH DAILY. 30 tablet 2  . ipratropium-albuterol (DUONEB) 0.5-2.5 (3) MG/3ML SOLN Take 3 mLs by nebulization every 4 (four) hours as needed. 360 mL 1  . metFORMIN (GLUCOPHAGE) 1000 MG tablet TAKE 1 TABLET BY MOUTH TWICE DAILY WITH FOOD FOR DIABETES. 60 tablet 2  . polyethylene glycol powder (GLYCOLAX/MIRALAX) powder Take 17 g by mouth daily. 500 g 3  . pravastatin (PRAVACHOL) 40 MG tablet TAKE (1) TABLET BY MOUTH AT BEDTIME FOR CHOLESTEROL. 30 tablet 2  . risperiDONE (RISPERDAL) 1 MG tablet Take 1 tablet (1 mg total) by mouth at bedtime. 30 tablet 2  . risperidone (RISPERDAL) 4 MG tablet Take 1  tablet (4 mg total) by mouth at bedtime. 30 tablet 2  . SPIRIVA HANDIHALER 18 MCG inhalation capsule INHALE 1 CAPSULE AS DIRECTED ONCE A DAY. 30 capsule 4  . SYMBICORT 80-4.5 MCG/ACT inhaler INHALE 2 PUFFS INTO LUNGS TWICE DAILY. 10.2 g 3  . triamterene-hydrochlorothiazide (MAXZIDE-25) 37.5-25 MG per tablet TAKE (1) TABLET BY MOUTH ONCE DAILY. 30 tablet 2  . valsartan (DIOVAN) 320 MG tablet TAKE ONE TABLET BY MOUTH DAILY. 30 tablet 2  . ZETIA 10 MG tablet TAKE 1 TABLET BY MOUTH ONCE DAILY. 30 tablet 2  . buPROPion (WELLBUTRIN XL) 150 MG 24 hr tablet Take 1 tablet (150 mg total) by mouth every morning. 30 tablet 2  . [DISCONTINUED] sildenafil (VIAGRA) 100 MG tablet Take 100 mg by mouth. Take one tablet 30 mins before intercourse      Current Facility-Administered Medications  Medication Dose Route Frequency Provider Last Rate Last Dose  . Influenza (>/= 3 years) inactive virus vaccine (FLVIRIN/FLUZONE) injection SUSP 0.5  mL  0.5 mL Intramuscular Once Fayrene Helper, MD        Previous Psychotropic Medications:  Medication Dose   Zoloft, doesn't remember the name of the others                        Substance Abuse History in the last 12 months: Substance Age of 1st Use Last Use Amount Specific Type  Nicotine    smokes one to 2 packs per day    Alcohol      Cannabis      Opiates      Cocaine      Methamphetamines      LSD      Ecstasy      Benzodiazepines      Caffeine      Inhalants      Others:                          Medical Consequences of Substance Abuse: Use of hallucinogens in the past may have contributed to his mental illness  Legal Consequences of Substance Abuse: none  Family Consequences of Substance Abuse: none  Blackouts:  No DT's:  No Withdrawal Symptoms:  No None  Social History: Current Place of Residence: [place] of Birth: [place] Family Members: One brother, 4 sisters Marital Status:   Divorced Children:   Sons:   Daughters: 2 Relationships:  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 Legal History: Arrested in his 92s for DUI, later arrested for an decent exposure while he was mentally ill in his 3s Hobbies/Interests: Reading his Bible  Family History:   Family History  Problem Relation Age of Onset  . Diabetes Mother   . Hypertension Mother   . Stroke Mother   . Diabetes Sister   . Heart disease Sister   . Kidney disease Father     Mental Status Examination/Evaluation: Objective:  Appearance: Casual and Fairly Groomed  Eye Contact::  Poor  Speech:  Slow  Volume:  Decreased  Mood:  Somewhat depressed   Affect:  Blunt  Thought Process:  Coherent  Orientation:  Full (Time, Place, and Person)  Thought Content:  Hallucinations: Auditory and Paranoid Ideation  Suicidal Thoughts:  No  Homicidal Thoughts:  No  Judgement:  Fair  Insight:  Fair  Psychomotor Activity:  Normal  Akathisia:  No  Handed:  Right  AIMS (if indicated):  Occasional tremors in  hands but none are evident today no other tardive dyskinesia symptoms   Assets:  Communication Skills Desire for Improvement Resilience    Laboratory/X-Ray Psychological Evaluation(s)   He has not gotten his most recent labs from Dr. Griffin Dakin office and I've told him this needs to be done as soon as possible so we can check his A1c and basic metabolic panel      Assessment:  Axis I: Schizoaffective Disorder  AXIS I Schizoaffective Disorder  AXIS II Deferred  AXIS III Past Medical History  Diagnosis Date  . Nicotine addiction   . Schizophrenia   . Hyperlipidemia   . Obesity   . Hypertension   . Diabetes mellitus   . COPD (chronic obstructive pulmonary disease)   . Arthritis   . Acute respiratory failure with hypoxia   . Acute kidney injury      AXIS  IV other  psychosocial or environmental problems  AXIS V 51-60 moderate symptoms   Treatment Plan/Recommendations:  Plan of Care: Medication management   Laboratory already ordered   Psychotherapy "not indicated   Medications: He will continue Risperdal 5 mg at bedtime for his schizophrenic-psychotic symptoms. He can continue BuSpar 15 mg daily for anxiety, he will start Wellbutrin XL 150 mg every morning for depression   Routine PRN Medications:  No  Consultations:   Safety Concerns:  He denies thoughts of harm to self or others   Other: He'll return in 4 weeks     Levonne Spiller, MD 5/19/201610:47 AM

## 2015-05-07 LAB — LIPID PANEL
Cholesterol: 111 mg/dL (ref 0–200)
HDL: 28 mg/dL — ABNORMAL LOW (ref >=40)
LDL Cholesterol: 39 mg/dL (ref 0–99)
Total CHOL/HDL Ratio: 4 Ratio
Triglycerides: 222 mg/dL — ABNORMAL HIGH (ref <150)
VLDL: 44 mg/dL — ABNORMAL HIGH (ref 0–40)

## 2015-05-07 LAB — TSH: TSH: 1.437 u[IU]/mL (ref 0.350–4.500)

## 2015-05-07 LAB — COMPLETE METABOLIC PANEL WITH GFR
ALT: 26 U/L (ref 0–53)
AST: 21 U/L (ref 0–37)
Albumin: 4.1 g/dL (ref 3.5–5.2)
Alkaline Phosphatase: 52 U/L (ref 39–117)
BUN: 6 mg/dL (ref 6–23)
CO2: 23 mEq/L (ref 19–32)
Calcium: 9.8 mg/dL (ref 8.4–10.5)
Chloride: 97 mEq/L (ref 96–112)
Creat: 1.35 mg/dL (ref 0.50–1.35)
GFR, Est African American: 63 mL/min
GFR, Est Non African American: 55 mL/min — ABNORMAL LOW
Glucose, Bld: 53 mg/dL — ABNORMAL LOW (ref 70–99)
Potassium: 3.7 mEq/L (ref 3.5–5.3)
Sodium: 130 mEq/L — ABNORMAL LOW (ref 135–145)
Total Bilirubin: 0.4 mg/dL (ref 0.2–1.2)
Total Protein: 7.2 g/dL (ref 6.0–8.3)

## 2015-05-08 LAB — HEMOGLOBIN A1C
Hgb A1c MFr Bld: 6.2 % — ABNORMAL HIGH (ref <5.7)
Mean Plasma Glucose: 131 mg/dL — ABNORMAL HIGH (ref <117)

## 2015-05-08 LAB — PSA, MEDICARE: PSA: 4.43 ng/mL — ABNORMAL HIGH (ref <=4.00)

## 2015-05-10 ENCOUNTER — Encounter: Payer: Self-pay | Admitting: Family Medicine

## 2015-05-10 NOTE — Addendum Note (Signed)
Addended by: Eual Fines on: 05/10/2015 04:18 PM   Modules accepted: Orders

## 2015-06-03 ENCOUNTER — Encounter (HOSPITAL_COMMUNITY): Payer: Self-pay | Admitting: Psychiatry

## 2015-06-03 ENCOUNTER — Ambulatory Visit (INDEPENDENT_AMBULATORY_CARE_PROVIDER_SITE_OTHER): Payer: Medicare HMO | Admitting: Psychiatry

## 2015-06-03 VITALS — BP 110/64 | Ht 73.0 in | Wt 254.0 lb

## 2015-06-03 DIAGNOSIS — F259 Schizoaffective disorder, unspecified: Secondary | ICD-10-CM | POA: Diagnosis not present

## 2015-06-03 DIAGNOSIS — F200 Paranoid schizophrenia: Secondary | ICD-10-CM

## 2015-06-03 MED ORDER — BUSPIRONE HCL 15 MG PO TABS: 15.0000 mg | ORAL_TABLET | Freq: Two times a day (BID) | ORAL | Status: DC

## 2015-06-03 MED ORDER — RISPERIDONE 1 MG PO TABS: 1.0000 mg | ORAL_TABLET | Freq: Every day | ORAL | Status: DC

## 2015-06-03 MED ORDER — RISPERIDONE 4 MG PO TABS: 4.0000 mg | ORAL_TABLET | Freq: Every day | ORAL | Status: DC

## 2015-06-03 NOTE — Progress Notes (Signed)
Patient ID: Matthew Molina, male   DOB: 12/19/1950, 64 y.o.   MRN: [id number]  Psychiatric Assessment Adult  Patient Identification:  Matthew Molina Date of Evaluation:  06/03/2015 Chief Complaint: "I couldn't take the Wellbutrin" History of Chief Complaint:   Chief Complaint  Patient presents with  . Schizophrenia  . Anxiety  . Follow-up    Anxiety Symptoms include nervous/anxious behavior.     this patient is a 64 year old divorced black male who lives alone in [place]. He has 2 daughters but they do not live in the area. He used to work in a [employer] but has been on disability since 1996 for schizophrenia.  The patient was referred by his primary physician, Dr. [name], for further assessment and treatment of schizophrenia.  The patient is a poor historian but thinks he became mentally ill sometime in his 61s. In his 25s he used a lot of marijuana pills and LSD which may have contributed to the mental illness. He also drank quite a bit of alcohol. Around age 1 however he "got saved" and stopped all of this.  Apparently his symptoms started when he worked in [employer]. He began hearing voices and later became increasingly paranoid. He used to go to treatment at the [facility] and later at [facility] and families. He doesn't remember the medications that he's taken over the years. He's been on Risperdal for quite a while and last year his symptoms worsened and had to be increased from 4-5 mg at bedtime. He has been only hospitalized once at [facility] years ago after he was involuntarily committed by a psychiatrist at the [facility].  Currently the patient states that he still hears a little bit of voices in the evenings. They're telling him to drink or gamble but he doesn't listen to them. He denies being paranoid but sometimes feels like he is being watched. He is most concerned because he is getting more depressed. His mood is low  and he is more withdrawn than he used to be. He reads his Bible but doesn't go to church like he did before. His energy is low but he is eating and sleeping well. He is anxious but [name] put him on BuSpar which is helped to some degree. He denies overt panic attacks He doesn't have any thoughts of hurting self or others. He is fairly compliant with his medicines but sometimes forgets and has to have his neighbor help him. He tends to stay isolated. He does not use any drugs or alcohol currently  The patient returns after 4 weeks. Last time I tried adding Wellbutrin but he states that after week it made him so sick to his stomach that he had to stop it. He's not really feeling depressed anymore but more anxious and worried. The BuSpar he is on is helped a little bit and I told him we could still increase it. He denies hearing any voices or feeling paranoid right now. He still feels somewhat uncomfortable being out around people are going to church. He does have a neighbor come over almost on a daily basis. He did denies auditory or visual loosening shows or paranoid ideation or suicidal ideation Review of Systems  Constitutional: Positive for activity change.  HENT: Negative.   Eyes: Negative.   Respiratory: Negative.   Cardiovascular: Negative.   Gastrointestinal: Negative.   Endocrine: Negative.   Genitourinary: Negative.   Musculoskeletal: Positive for joint swelling, arthralgias and  gait problem.  Skin: Negative.   Allergic/Immunologic: Negative.   Neurological: Negative.   Hematological: Negative.   Psychiatric/Behavioral: Positive for hallucinations and dysphoric mood. The patient is nervous/anxious.    Physical Exam not done  Depressive Symptoms: depressed mood, anhedonia, psychomotor retardation, anxiety, loss of energy/fatigue,  (Hypo) Manic Symptoms:   Elevated Mood:  No Irritable Mood:  No Grandiosity:  No Distractibility:  No Labiality of Mood:  No Delusions:   Yes Hallucinations:  Yes Impulsivity:  No Sexually Inappropriate Behavior:  No Financial Extravagance:  No Flight of Ideas:  No  Anxiety Symptoms: Excessive Worry:  Yes Panic Symptoms:  No Agoraphobia:  No Obsessive Compulsive: No  Symptoms: None, Specific Phobias:  No Social Anxiety:  No  Psychotic Symptoms:  Hallucinations: Yes Auditory Delusions:  Yes Paranoia:  Yes   Ideas of Reference:  No  PTSD Symptoms: Ever had a traumatic exposure:  No Had a traumatic exposure in the last month:  No Re-experiencing: No None Hypervigilance:  No Hyperarousal: No None Avoidance: No None  Traumatic Brain Injury: Yes fell down while playing as a child  Past Psychiatric History: Diagnosis: Schizophrenia   Hospitalizations: Once in the past at [facility]   Outpatient Care: For many years at [facility]: none  Self-Mutilation: none  Suicidal Attempts: none  Violent Behaviors: none   Past Medical History:   Past Medical History  Diagnosis Date  . Nicotine addiction   . Schizophrenia   . Hyperlipidemia   . Obesity   . Hypertension   . Diabetes mellitus   . COPD (chronic obstructive pulmonary disease)   . Arthritis   . Acute respiratory failure with hypoxia   . Acute kidney injury    History of Loss of Consciousness:  No Seizure History:  No Cardiac History:  No Allergies:   Allergies  Allergen Reactions  . Sertraline Hcl     Stomach upset/pain   Current Medications:  Current Outpatient Prescriptions  Medication Sig Dispense Refill  . ACCU-CHEK AVIVA PLUS test strip TEST BLOOD SUGAR ONCE DAILY AS DIRECTED. 50 each 4  . albuterol (PROVENTIL HFA;VENTOLIN HFA) 108 (90 BASE) MCG/ACT inhaler Inhale 2 puffs into the lungs every 6 (six) hours as needed for wheezing or shortness of breath. 1 Inhaler 2  . amLODipine (NORVASC) 10 MG tablet TAKE (1) TABLET BY MOUTH DAILY. 30 tablet 2  . aspirin EC 81 MG tablet Take  81 mg by mouth daily.    . busPIRone (BUSPAR) 15 MG tablet Take 1 tablet (15 mg total) by mouth 2 (two) times daily. 60 tablet 2  . GLIPIZIDE XL 10 MG 24 hr tablet TAKE 1 TABLET BY MOUTH DAILY. 30 tablet 2  . ipratropium-albuterol (DUONEB) 0.5-2.5 (3) MG/3ML SOLN Take 3 mLs by nebulization every 4 (four) hours as needed. 360 mL 1  . metFORMIN (GLUCOPHAGE) 1000 MG tablet TAKE 1 TABLET BY MOUTH TWICE DAILY WITH FOOD FOR DIABETES. 60 tablet 2  . polyethylene glycol powder (GLYCOLAX/MIRALAX) powder Take 17 g by mouth daily. 500 g 3  . pravastatin (PRAVACHOL) 40 MG tablet TAKE (1) TABLET BY MOUTH AT BEDTIME FOR CHOLESTEROL. 30 tablet 2  . risperiDONE (RISPERDAL) 1 MG tablet Take 1 tablet (1 mg total) by mouth at bedtime. 30 tablet 2  . risperidone (RISPERDAL) 4 MG tablet Take 1 tablet (4 mg total) by mouth at bedtime. 30 tablet 2  . SPIRIVA HANDIHALER 18 MCG inhalation capsule INHALE 1 CAPSULE AS  DIRECTED ONCE A DAY. 30 capsule 4  . SYMBICORT 80-4.5 MCG/ACT inhaler INHALE 2 PUFFS INTO LUNGS TWICE DAILY. 10.2 g 3  . triamterene-hydrochlorothiazide (MAXZIDE-25) 37.5-25 MG per tablet TAKE (1) TABLET BY MOUTH ONCE DAILY. 30 tablet 2  . valsartan (DIOVAN) 320 MG tablet TAKE ONE TABLET BY MOUTH DAILY. 30 tablet 2  . ZETIA 10 MG tablet TAKE 1 TABLET BY MOUTH ONCE DAILY. 30 tablet 2  . [DISCONTINUED] sildenafil (VIAGRA) 100 MG tablet Take 100 mg by mouth. Take one tablet 30 mins before intercourse      Current Facility-Administered Medications  Medication Dose Route Frequency Provider Last Rate Last Dose  . Influenza (>/= 3 years) inactive virus vaccine (FLVIRIN/FLUZONE) injection SUSP 0.5 mL  0.5 mL Intramuscular Once Fayrene Helper, MD        Previous Psychotropic Medications:  Medication Dose   Zoloft, doesn't remember the name of the others                        Substance Abuse History in the last 12 months: Substance Age of 1st Use Last Use Amount Specific Type  Nicotine    smokes one  to 2 packs per day    Alcohol      Cannabis      Opiates      Cocaine      Methamphetamines      LSD      Ecstasy      Benzodiazepines      Caffeine      Inhalants      Others:                          Medical Consequences of Substance Abuse: Use of hallucinogens in the past may have contributed to his mental illness  Legal Consequences of Substance Abuse: none  Family Consequences of Substance Abuse: none  Blackouts:  No DT's:  No Withdrawal Symptoms:  No None  Social History: Current Place of Residence: [place] of Birth: 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 Legal History: Arrested in his 64s for DUI, later arrested for an decent exposure while he was mentally ill in his 40s Hobbies/Interests: Reading his Bible  Family History:   Family History  Problem Relation Age of Onset  . Diabetes Mother   . Hypertension Mother   . Stroke Mother   . Diabetes Sister   . Heart disease Sister   . Kidney disease Father     Mental Status Examination/Evaluation: Objective:  Appearance: Casual and Fairly Groomed  Eye Contact::  fair  Speech:  Slow  Volume:  Decreased  Mood:  Slightly anxious but less depressed than last time   Affect:  Blunt  Thought Process:  Coherent  Orientation:  Full (Time, Place, and Person)  Thought Content: Denies hallucinations today   Suicidal Thoughts:  No  Homicidal Thoughts:  No  Judgement:  Fair  Insight:  Fair  Psychomotor Activity:  Normal  Akathisia:  No  Handed:  Right  AIMS (if indicated):  Occasional tremors in  hands but none are evident today no other tardive dyskinesia symptoms   Assets:  Communication  Skills Desire for Improvement Resilience  Laboratory/X-Ray Psychological Evaluation(s)  His PSA is elevated. A1c is elevated slightly as well as triglycerides. [name] is following all of this. Cholesterol is good      Assessment:  Axis I: Schizoaffective Disorder  AXIS I Schizoaffective Disorder  AXIS II Deferred  AXIS III Past Medical History  Diagnosis Date  . Nicotine addiction   . Schizophrenia   . Hyperlipidemia   . Obesity   . Hypertension   . Diabetes mellitus   . COPD (chronic obstructive pulmonary disease)   . Arthritis   . Acute respiratory failure with hypoxia   . Acute kidney injury      AXIS IV other psychosocial or environmental problems  AXIS V 51-60 moderate symptoms   Treatment Plan/Recommendations:  Plan of Care: Medication management   Laboratory already ordered   Psychotherapy "not indicated   Medications: He will continue Risperdal 5 mg at bedtime for his schizophrenic-psychotic symptoms. He can continue BuSpar but increase the dose to 50 mg twice a day for anxiety   Routine PRN Medications:  No  Consultations:   Safety Concerns:  He denies thoughts of harm to self or others   Other: He'll return in 2 months     Levonne Spiller, MD 6/16/201610:07 AM

## 2015-06-08 ENCOUNTER — Other Ambulatory Visit: Payer: Self-pay | Admitting: Family Medicine

## 2015-06-16 ENCOUNTER — Other Ambulatory Visit: Payer: Self-pay | Admitting: Family Medicine

## 2015-07-01 ENCOUNTER — Other Ambulatory Visit: Payer: Self-pay | Admitting: Family Medicine

## 2015-07-16 ENCOUNTER — Ambulatory Visit (INDEPENDENT_AMBULATORY_CARE_PROVIDER_SITE_OTHER): Payer: Medicare HMO | Admitting: Urology

## 2015-07-16 DIAGNOSIS — R972 Elevated prostate specific antigen [PSA]: Secondary | ICD-10-CM

## 2015-07-16 DIAGNOSIS — N400 Enlarged prostate without lower urinary tract symptoms: Secondary | ICD-10-CM | POA: Diagnosis not present

## 2015-07-24 ENCOUNTER — Encounter (HOSPITAL_COMMUNITY): Payer: Self-pay | Admitting: Emergency Medicine

## 2015-07-24 ENCOUNTER — Emergency Department (HOSPITAL_COMMUNITY): Payer: Medicare HMO

## 2015-07-24 ENCOUNTER — Emergency Department (HOSPITAL_COMMUNITY): Admit: 2015-07-24 | Payer: Self-pay | Source: Home / Self Care

## 2015-07-24 ENCOUNTER — Encounter (HOSPITAL_COMMUNITY): Payer: Self-pay

## 2015-07-24 ENCOUNTER — Inpatient Hospital Stay (HOSPITAL_COMMUNITY)
Admission: EM | Admit: 2015-07-24 | Discharge: 2015-07-26 | DRG: 190 | Disposition: A | Discharge status: Home / Self Care | Payer: Medicare HMO | Attending: Family Medicine | Admitting: Family Medicine

## 2015-07-24 DIAGNOSIS — Z833 Family history of diabetes mellitus: Secondary | ICD-10-CM | POA: Diagnosis not present

## 2015-07-24 DIAGNOSIS — R0602 Shortness of breath: Secondary | ICD-10-CM

## 2015-07-24 DIAGNOSIS — Z720 Tobacco use: Secondary | ICD-10-CM | POA: Diagnosis not present

## 2015-07-24 DIAGNOSIS — Z8249 Family history of ischemic heart disease and other diseases of the circulatory system: Secondary | ICD-10-CM

## 2015-07-24 DIAGNOSIS — E1165 Type 2 diabetes mellitus with hyperglycemia: Secondary | ICD-10-CM | POA: Diagnosis present

## 2015-07-24 DIAGNOSIS — Z79899 Other long term (current) drug therapy: Secondary | ICD-10-CM | POA: Diagnosis not present

## 2015-07-24 DIAGNOSIS — E871 Hypo-osmolality and hyponatremia: Secondary | ICD-10-CM | POA: Diagnosis present

## 2015-07-24 DIAGNOSIS — Z6833 Body mass index (BMI) 33.0-33.9, adult: Secondary | ICD-10-CM

## 2015-07-24 DIAGNOSIS — N183 Chronic kidney disease, stage 3 (moderate): Secondary | ICD-10-CM | POA: Diagnosis present

## 2015-07-24 DIAGNOSIS — E119 Type 2 diabetes mellitus without complications: Secondary | ICD-10-CM | POA: Diagnosis not present

## 2015-07-24 DIAGNOSIS — J441 Chronic obstructive pulmonary disease with (acute) exacerbation: Principal | ICD-10-CM | POA: Diagnosis present

## 2015-07-24 DIAGNOSIS — I10 Essential (primary) hypertension: Secondary | ICD-10-CM | POA: Diagnosis present

## 2015-07-24 DIAGNOSIS — M1990 Unspecified osteoarthritis, unspecified site: Secondary | ICD-10-CM | POA: Diagnosis present

## 2015-07-24 DIAGNOSIS — N179 Acute kidney failure, unspecified: Secondary | ICD-10-CM | POA: Diagnosis present

## 2015-07-24 DIAGNOSIS — Z888 Allergy status to other drugs, medicaments and biological substances status: Secondary | ICD-10-CM

## 2015-07-24 DIAGNOSIS — F209 Schizophrenia, unspecified: Secondary | ICD-10-CM | POA: Diagnosis present

## 2015-07-24 DIAGNOSIS — F17210 Nicotine dependence, cigarettes, uncomplicated: Secondary | ICD-10-CM | POA: Diagnosis present

## 2015-07-24 DIAGNOSIS — I129 Hypertensive chronic kidney disease with stage 1 through stage 4 chronic kidney disease, or unspecified chronic kidney disease: Secondary | ICD-10-CM | POA: Diagnosis present

## 2015-07-24 DIAGNOSIS — Z7982 Long term (current) use of aspirin: Secondary | ICD-10-CM | POA: Diagnosis not present

## 2015-07-24 DIAGNOSIS — J9621 Acute and chronic respiratory failure with hypoxia: Secondary | ICD-10-CM | POA: Diagnosis present

## 2015-07-24 DIAGNOSIS — E785 Hyperlipidemia, unspecified: Secondary | ICD-10-CM | POA: Diagnosis present

## 2015-07-24 DIAGNOSIS — F411 Generalized anxiety disorder: Secondary | ICD-10-CM | POA: Diagnosis present

## 2015-07-24 DIAGNOSIS — E1122 Type 2 diabetes mellitus with diabetic chronic kidney disease: Secondary | ICD-10-CM | POA: Diagnosis present

## 2015-07-24 DIAGNOSIS — Z841 Family history of disorders of kidney and ureter: Secondary | ICD-10-CM

## 2015-07-24 DIAGNOSIS — E669 Obesity, unspecified: Secondary | ICD-10-CM | POA: Diagnosis present

## 2015-07-24 DIAGNOSIS — E872 Acidosis: Secondary | ICD-10-CM | POA: Diagnosis present

## 2015-07-24 DIAGNOSIS — R918 Other nonspecific abnormal finding of lung field: Secondary | ICD-10-CM | POA: Diagnosis present

## 2015-07-24 DIAGNOSIS — Z9981 Dependence on supplemental oxygen: Secondary | ICD-10-CM

## 2015-07-24 DIAGNOSIS — E1169 Type 2 diabetes mellitus with other specified complication: Secondary | ICD-10-CM

## 2015-07-24 HISTORY — DX: Type 2 diabetes mellitus without complications: E11.9

## 2015-07-24 HISTORY — DX: Other nonspecific abnormal finding of lung field: R91.8

## 2015-07-24 LAB — CBC
HCT: 38 % — ABNORMAL LOW (ref 39.0–52.0)
Hemoglobin: 12.6 g/dL — ABNORMAL LOW (ref 13.0–17.0)
MCH: 29 pg (ref 26.0–34.0)
MCHC: 33.2 g/dL (ref 30.0–36.0)
MCV: 87.6 fL (ref 78.0–100.0)
Platelets: 225 10*3/uL (ref 150–400)
RBC: 4.34 MIL/uL (ref 4.22–5.81)
RDW: 14.7 % (ref 11.5–15.5)
WBC: 6.9 10*3/uL (ref 4.0–10.5)

## 2015-07-24 LAB — BLOOD GAS, ARTERIAL
Acid-base deficit: 8.8 mmol/L — ABNORMAL HIGH (ref 0.0–2.0)
Bicarbonate: 15.5 mEq/L — ABNORMAL LOW (ref 20.0–24.0)
Drawn by: 27407
O2 Content: 4 L/min
O2 Saturation: 93 %
TCO2: 14 mmol/L (ref 0–100)
pCO2 arterial: 27.8 mmHg — ABNORMAL LOW (ref 35.0–45.0)
pH, Arterial: 7.365 (ref 7.350–7.450)
pO2, Arterial: 69.7 mmHg — ABNORMAL LOW (ref 80.0–100.0)

## 2015-07-24 LAB — GLUCOSE, CAPILLARY
Glucose-Capillary: 344 mg/dL — ABNORMAL HIGH (ref 65–99)
Glucose-Capillary: 254 mg/dL — ABNORMAL HIGH (ref 65–99)

## 2015-07-24 LAB — BASIC METABOLIC PANEL
Anion gap: 13 (ref 5–15)
BUN: 9 mg/dL (ref 6–20)
CO2: 20 mmol/L — ABNORMAL LOW (ref 22–32)
Calcium: 9.4 mg/dL (ref 8.9–10.3)
Chloride: 99 mmol/L — ABNORMAL LOW (ref 101–111)
Creatinine, Ser: 1.55 mg/dL — ABNORMAL HIGH (ref 0.61–1.24)
GFR calc Af Amer: 53 mL/min — ABNORMAL LOW (ref >60)
GFR calc non Af Amer: 45 mL/min — ABNORMAL LOW (ref >60)
Glucose, Bld: 98 mg/dL (ref 65–99)
Potassium: 3.6 mmol/L (ref 3.5–5.1)
Sodium: 132 mmol/L — ABNORMAL LOW (ref 135–145)

## 2015-07-24 LAB — MRSA PCR SCREENING: MRSA by PCR: NEGATIVE

## 2015-07-24 MED ORDER — LEVALBUTEROL HCL 0.63 MG/3ML IN NEBU: 0.6300 mg | INHALATION_SOLUTION | RESPIRATORY_TRACT | Status: DC

## 2015-07-24 MED ORDER — POTASSIUM CHLORIDE IN NACL 20-0.9 MEQ/L-% IV SOLN
INTRAVENOUS | Status: DC
  Administered 2015-07-24 (×2): via INTRAVENOUS

## 2015-07-24 MED ORDER — SODIUM CHLORIDE 0.9 % IV SOLN: INTRAVENOUS | Status: DC

## 2015-07-24 MED ORDER — POLYETHYLENE GLYCOL 3350 17 G PO PACK
17.0000 g | PACK | Freq: Every day | ORAL | Status: DC
  Administered 2015-07-24 – 2015-07-26 (×3): 17 g via ORAL
  Filled 2015-07-24 (×3): qty 1

## 2015-07-24 MED ORDER — ASPIRIN EC 81 MG PO TBEC
81.0000 mg | DELAYED_RELEASE_TABLET | Freq: Every day | ORAL | Status: DC
  Administered 2015-07-24 – 2015-07-26 (×3): 81 mg via ORAL
  Filled 2015-07-24 (×3): qty 1

## 2015-07-24 MED ORDER — EZETIMIBE 10 MG PO TABS
10.0000 mg | ORAL_TABLET | Freq: Every day | ORAL | Status: DC
  Administered 2015-07-24 – 2015-07-26 (×3): 10 mg via ORAL
  Filled 2015-07-24 (×3): qty 1

## 2015-07-24 MED ORDER — ACETAMINOPHEN 325 MG PO TABS: 650.0000 mg | ORAL_TABLET | Freq: Four times a day (QID) | ORAL | Status: DC | PRN

## 2015-07-24 MED ORDER — ONDANSETRON HCL 4 MG/2ML IJ SOLN: 4.0000 mg | Freq: Four times a day (QID) | INTRAMUSCULAR | Status: DC | PRN

## 2015-07-24 MED ORDER — RISPERIDONE 1 MG PO TABS
4.0000 mg | ORAL_TABLET | Freq: Every day | ORAL | Status: DC
  Administered 2015-07-24 – 2015-07-25 (×2): 4 mg via ORAL
  Filled 2015-07-24 (×2): qty 4

## 2015-07-24 MED ORDER — ALBUTEROL SULFATE (2.5 MG/3ML) 0.083% IN NEBU
2.5000 mg | INHALATION_SOLUTION | RESPIRATORY_TRACT | Status: DC | PRN
Start: 2015-07-24 — End: 2015-07-26
  Administered 2015-07-25: 2.5 mg via RESPIRATORY_TRACT
  Filled 2015-07-24: qty 3

## 2015-07-24 MED ORDER — IRBESARTAN 300 MG PO TABS
300.0000 mg | ORAL_TABLET | Freq: Every day | ORAL | Status: DC
  Administered 2015-07-25 – 2015-07-26 (×2): 300 mg via ORAL
  Filled 2015-07-24 (×2): qty 1

## 2015-07-24 MED ORDER — PRAVASTATIN SODIUM 40 MG PO TABS
40.0000 mg | ORAL_TABLET | Freq: Every day | ORAL | Status: DC
  Administered 2015-07-24 – 2015-07-25 (×2): 40 mg via ORAL
  Filled 2015-07-24 (×2): qty 1

## 2015-07-24 MED ORDER — METHYLPREDNISOLONE SODIUM SUCC 125 MG IJ SOLR
80.0000 mg | Freq: Four times a day (QID) | INTRAMUSCULAR | Status: DC
  Administered 2015-07-24 – 2015-07-25 (×4): 80 mg via INTRAVENOUS
  Filled 2015-07-24 (×4): qty 2

## 2015-07-24 MED ORDER — BUDESONIDE-FORMOTEROL FUMARATE 80-4.5 MCG/ACT IN AERO
2.0000 | INHALATION_SPRAY | Freq: Two times a day (BID) | RESPIRATORY_TRACT | Status: DC
  Filled 2015-07-24: qty 6.9

## 2015-07-24 MED ORDER — ACETAMINOPHEN 650 MG RE SUPP: 650.0000 mg | Freq: Four times a day (QID) | RECTAL | Status: DC | PRN

## 2015-07-24 MED ORDER — CETYLPYRIDINIUM CHLORIDE 0.05 % MT LIQD
7.0000 mL | Freq: Two times a day (BID) | OROMUCOSAL | Status: DC
  Administered 2015-07-24 – 2015-07-26 (×4): 7 mL via OROMUCOSAL

## 2015-07-24 MED ORDER — GUAIFENESIN-DM 100-10 MG/5ML PO SYRP
15.0000 mL | ORAL_SOLUTION | Freq: Once | ORAL | Status: AC
  Administered 2015-07-24: 15 mL via ORAL
  Filled 2015-07-24: qty 15

## 2015-07-24 MED ORDER — IPRATROPIUM BROMIDE 0.02 % IN SOLN: 0.5000 mg | RESPIRATORY_TRACT | Status: DC

## 2015-07-24 MED ORDER — HYDROCOD POLST-CPM POLST ER 10-8 MG/5ML PO SUER
5.0000 mL | Freq: Two times a day (BID) | ORAL | Status: DC
  Administered 2015-07-24 – 2015-07-26 (×4): 5 mL via ORAL
  Filled 2015-07-24 (×4): qty 5

## 2015-07-24 MED ORDER — DEXTROSE 5 % IV SOLN
500.0000 mg | INTRAVENOUS | Status: DC
  Administered 2015-07-24 – 2015-07-26 (×3): 500 mg via INTRAVENOUS
  Filled 2015-07-24 (×4): qty 500

## 2015-07-24 MED ORDER — DEXTROSE 5 % IV SOLN
2.0000 g | INTRAVENOUS | Status: DC
  Administered 2015-07-25 – 2015-07-26 (×2): 2 g via INTRAVENOUS
  Filled 2015-07-24 (×3): qty 2

## 2015-07-24 MED ORDER — BUSPIRONE HCL 5 MG PO TABS
15.0000 mg | ORAL_TABLET | Freq: Two times a day (BID) | ORAL | Status: DC
  Administered 2015-07-24 – 2015-07-26 (×4): 15 mg via ORAL
  Filled 2015-07-24 (×3): qty 3
  Filled 2015-07-24 (×5): qty 1

## 2015-07-24 MED ORDER — LEVALBUTEROL HCL 0.63 MG/3ML IN NEBU
0.6300 mg | INHALATION_SOLUTION | Freq: Four times a day (QID) | RESPIRATORY_TRACT | Status: DC
  Administered 2015-07-24 – 2015-07-25 (×5): 0.63 mg via RESPIRATORY_TRACT
  Filled 2015-07-24 (×5): qty 3

## 2015-07-24 MED ORDER — NICOTINE 21 MG/24HR TD PT24
21.0000 mg | MEDICATED_PATCH | Freq: Every day | TRANSDERMAL | Status: DC
  Administered 2015-07-24 – 2015-07-26 (×3): 21 mg via TRANSDERMAL
  Filled 2015-07-24 (×3): qty 1

## 2015-07-24 MED ORDER — ONDANSETRON HCL 4 MG PO TABS: 4.0000 mg | ORAL_TABLET | Freq: Four times a day (QID) | ORAL | Status: DC | PRN

## 2015-07-24 MED ORDER — SODIUM CHLORIDE 0.9 % IV SOLN
INTRAVENOUS | Status: DC
  Administered 2015-07-24: 12:00:00 via INTRAVENOUS

## 2015-07-24 MED ORDER — ALUM & MAG HYDROXIDE-SIMETH 200-200-20 MG/5ML PO SUSP
30.0000 mL | Freq: Four times a day (QID) | ORAL | Status: DC | PRN
  Administered 2015-07-26: 30 mL via ORAL
  Filled 2015-07-24: qty 30

## 2015-07-24 MED ORDER — INSULIN GLARGINE 100 UNIT/ML ~~LOC~~ SOLN
10.0000 [IU] | Freq: Every day | SUBCUTANEOUS | Status: DC
  Administered 2015-07-24 – 2015-07-25 (×2): 10 [IU] via SUBCUTANEOUS
  Filled 2015-07-24 (×3): qty 0.1

## 2015-07-24 MED ORDER — AMLODIPINE BESYLATE 5 MG PO TABS
10.0000 mg | ORAL_TABLET | Freq: Every day | ORAL | Status: DC
  Administered 2015-07-24 – 2015-07-26 (×3): 10 mg via ORAL
  Filled 2015-07-24 (×3): qty 2

## 2015-07-24 MED ORDER — INSULIN ASPART 100 UNIT/ML ~~LOC~~ SOLN
0.0000 [IU] | Freq: Three times a day (TID) | SUBCUTANEOUS | Status: DC
  Administered 2015-07-24: 11 [IU] via SUBCUTANEOUS
  Administered 2015-07-25: 3 [IU] via SUBCUTANEOUS

## 2015-07-24 MED ORDER — METHYLPREDNISOLONE SODIUM SUCC 125 MG IJ SOLR: 125.0000 mg | Freq: Once | INTRAMUSCULAR | Status: DC

## 2015-07-24 MED ORDER — OXYCODONE HCL 5 MG PO TABS: 5.0000 mg | ORAL_TABLET | ORAL | Status: DC | PRN

## 2015-07-24 MED ORDER — IPRATROPIUM-ALBUTEROL 0.5-2.5 (3) MG/3ML IN SOLN
3.0000 mL | Freq: Once | RESPIRATORY_TRACT | Status: AC
  Administered 2015-07-24: 3 mL via RESPIRATORY_TRACT
  Filled 2015-07-24: qty 3

## 2015-07-24 MED ORDER — BENZONATATE 100 MG PO CAPS
100.0000 mg | ORAL_CAPSULE | Freq: Three times a day (TID) | ORAL | Status: DC
  Administered 2015-07-24 – 2015-07-26 (×6): 100 mg via ORAL
  Filled 2015-07-24 (×6): qty 1

## 2015-07-24 MED ORDER — IPRATROPIUM BROMIDE 0.02 % IN SOLN
0.5000 mg | RESPIRATORY_TRACT | Status: DC
  Administered 2015-07-24 – 2015-07-26 (×12): 0.5 mg via RESPIRATORY_TRACT
  Filled 2015-07-24 (×12): qty 2.5

## 2015-07-24 MED ORDER — ENOXAPARIN SODIUM 60 MG/0.6ML ~~LOC~~ SOLN
55.0000 mg | Freq: Every day | SUBCUTANEOUS | Status: DC
  Administered 2015-07-24 – 2015-07-26 (×3): 55 mg via SUBCUTANEOUS
  Filled 2015-07-24 (×3): qty 0.6

## 2015-07-24 MED ORDER — INSULIN ASPART 100 UNIT/ML ~~LOC~~ SOLN
0.0000 [IU] | Freq: Every day | SUBCUTANEOUS | Status: DC
  Administered 2015-07-24 – 2015-07-25 (×2): 3 [IU] via SUBCUTANEOUS

## 2015-07-24 MED ORDER — BUDESONIDE-FORMOTEROL FUMARATE 80-4.5 MCG/ACT IN AERO
2.0000 | INHALATION_SPRAY | Freq: Two times a day (BID) | RESPIRATORY_TRACT | Status: DC
  Administered 2015-07-24 – 2015-07-25 (×2): 2 via RESPIRATORY_TRACT
  Filled 2015-07-24: qty 6.9

## 2015-07-24 MED ORDER — DEXTROSE 5 % IV SOLN
1.0000 g | Freq: Once | INTRAVENOUS | Status: AC
  Administered 2015-07-24: 1 g via INTRAVENOUS
  Filled 2015-07-24: qty 10

## 2015-07-24 NOTE — ED Notes (Signed)
Given solumedrol 125 mg and 2 breathing treatments by EMS.  Sats when on room air with 1st responders.  Sats currently 88%.  #22g jelco to left wrist via EMS.

## 2015-07-24 NOTE — ED Notes (Signed)
Admitting MD and RT at bedside. Will transfer to ICU bed when available.

## 2015-07-24 NOTE — ED Provider Notes (Addendum)
CSN: [id number]     Arrival date & time 07/24/15  0815 History  This chart was scribed for Fredia Sorrow, MD by Thea Alken, ED Scribe. This patient was seen in room APA18/APA18 and the patient's care was started at 8:48 AM.  Chief Complaint  Patient presents with  . Shortness of Breath   The history is provided by the patient. No language interpreter was used.   HPI Comments:  Matthew Molina is a 64 y.o. Male brought in by EMS with pmhx of COPD, HTN and DM who present to the Emergency Department complaining of SOB for the past week. He has oxygen at home and uses 2 L. He ran out of symbicort inhaler last week. Reports associated cough, abdominal pain due to cough and back pain for the past couple of days.  He received steroid treatment from the EMS which he states has helped with SOB. He is currently on 4L O2 with RA stat of 94% prior stat was 88% before receiving O2. He denies fever, chills, visiual changes, rhinorrhea, sore throat, CP, nasuea, emesis. Diarrhea, sysura, rash and HA.  Past Medical History  Diagnosis Date  . Nicotine addiction   . Schizophrenia   . Hyperlipidemia   . Obesity   . Hypertension   . Diabetes mellitus   . COPD (chronic obstructive pulmonary disease)   . Arthritis   . Acute respiratory failure with hypoxia   . Acute kidney injury    Past Surgical History  Procedure Laterality Date  . Colonoscopy    . Colonoscopy N/A 04/01/2013    Procedure: COLONOSCOPY;  Surgeon: Danie Binder, MD;  Location: [facility] ENDO SUITE;  Service: Endoscopy;  Laterality: N/A;  10:00 AM-moved to [place] notified pt  . Cataract extraction w/phaco Left 11/20/2013    Procedure: CATARACT EXTRACTION PHACO AND INTRAOCULAR LENS PLACEMENT (IOC);  Surgeon: Tonny Branch, MD;  Location: [facility] ORS;  Service: Ophthalmology;  Laterality: Left;  CDE:10.26  . Cataract extraction w/phaco Right 12/08/2013    Procedure: RIGHT EYE CATARACT EXTRACTION PHACO AND INTRAOCULAR LENS PLACEMENT ;  Surgeon: Tonny Branch, MD;  Location: [facility] ORS;  Service: Ophthalmology;  Laterality: Right;  CDE 12.38  . Eye surgery Left 11/2013    cataract extraction   Family History  Problem Relation Age of Onset  . Diabetes Mother   . Hypertension Mother   . Stroke Mother   . Diabetes Sister   . Heart disease Sister   . Kidney disease Father    History  Substance Use Topics  . Smoking status: Current Every Day Smoker -- 2.00 packs/day for 50 years    Types: Cigarettes  . Smokeless tobacco: Never Used  . Alcohol Use: No    Review of Systems  Constitutional: Negative for fever and chills.  HENT: Negative for congestion, rhinorrhea and sore throat.   Eyes: Negative for visual disturbance.  Respiratory: Positive for cough and shortness of breath.   Cardiovascular: Negative for chest pain and leg swelling.  Gastrointestinal: Positive for abdominal pain. Negative for nausea, vomiting and diarrhea.  Genitourinary: Negative for dysuria and difficulty urinating.  Musculoskeletal: Positive for back pain.  Neurological: Negative for headaches.   Allergies  Sertraline hcl  Home Medications   Prior to Admission medications   Medication Sig Start Date End Date Taking? Authorizing Provider  amLODipine (NORVASC) 10 MG tablet TAKE (1) TABLET BY MOUTH DAILY. 06/17/15  Yes Fayrene Helper, MD  aspirin EC 81 MG tablet Take 81 mg by  mouth daily.   Yes Historical Provider, MD  busPIRone (BUSPAR) 15 MG tablet Take 1 tablet (15 mg total) by mouth 2 (two) times daily. 06/03/15  Yes Cloria Spring, MD  glipiZIDE (GLUCOTROL XL) 10 MG 24 hr tablet TAKE 1 TABLET BY MOUTH DAILY. 06/17/15  Yes Fayrene Helper, MD  ipratropium-albuterol (DUONEB) 0.5-2.5 (3) MG/3ML SOLN Take 3 mLs by nebulization every 4 (four) hours as needed. Patient taking differently: Take 3 mLs by nebulization every 4 (four) hours as needed (shortness of breath).  01/20/15  Yes Lezlie Octave Black, NP  metFORMIN (GLUCOPHAGE) 1000 MG tablet TAKE 1 TABLET BY MOUTH  TWICE DAILY WITH FOOD FOR DIABETES. 06/17/15  Yes Fayrene Helper, MD  polyethylene glycol powder (GLYCOLAX/MIRALAX) powder Take 17 g by mouth daily. 11/30/14  Yes Fayrene Helper, MD  pravastatin (PRAVACHOL) 40 MG tablet TAKE (1) TABLET BY MOUTH AT BEDTIME FOR CHOLESTEROL. 06/09/15  Yes Fayrene Helper, MD  PROAIR HFA 108 (90 BASE) MCG/ACT inhaler INHALE 2 PUFFS EVERY 6 HOURS AS NEEDED FOR WHEEZING OR SHORTNESS OF BREATH. 07/01/15  Yes Fayrene Helper, MD  risperiDONE (RISPERDAL) 1 MG tablet Take 1 tablet (1 mg total) by mouth at bedtime. 06/03/15  Yes Cloria Spring, MD  risperidone (RISPERDAL) 4 MG tablet Take 1 tablet (4 mg total) by mouth at bedtime. 06/03/15  Yes Cloria Spring, MD  SPIRIVA HANDIHALER 18 MCG inhalation capsule INHALE 1 CAPSULE AS DIRECTED ONCE A DAY. 02/22/15  Yes Fayrene Helper, MD  SYMBICORT 80-4.5 MCG/ACT inhaler INHALE 2 PUFFS INTO LUNGS TWICE DAILY. 04/19/15  Yes Fayrene Helper, MD  triamterene-hydrochlorothiazide (MAXZIDE-25) 37.5-25 MG per tablet TAKE (1) TABLET BY MOUTH ONCE DAILY. 06/17/15  Yes Fayrene Helper, MD  valsartan (DIOVAN) 320 MG tablet TAKE ONE TABLET BY MOUTH DAILY. 06/17/15  Yes Fayrene Helper, MD  ZETIA 10 MG tablet TAKE 1 TABLET BY MOUTH ONCE DAILY. 06/17/15  Yes Fayrene Helper, MD  ACCU-CHEK AVIVA PLUS test strip TEST BLOOD SUGAR ONCE DAILY AS DIRECTED. 12/14/14   Fayrene Helper, MD   BP 108/74 mmHg  Pulse 114  Temp(Src) 97.9 F (36.6 C) (Oral)  Resp 30  Ht 6\' 1"  (1.854 m)  Wt 250 lb (113.399 kg)  BMI 32.99 kg/m2  SpO2 91% Physical Exam  Constitutional: He is oriented to person, place, and time. He appears well-developed and well-nourished. No distress.  HENT:  Head: Normocephalic and atraumatic.  Mouth/Throat: Oropharynx is clear and moist.  Eyes: Conjunctivae and EOM are normal. Pupils are equal, round, and reactive to light. No scleral icterus.  Neck: Neck supple.  Cardiovascular: Regular rhythm.  Tachycardia  present.   No murmur heard. Pulmonary/Chest: Effort normal. He has decreased breath sounds. He has no wheezes.  Abdominal: Bowel sounds are normal. There is no tenderness.  Musculoskeletal: Normal range of motion. He exhibits no edema.  Neurological: He is alert and oriented to person, place, and time. No cranial nerve deficit. He exhibits normal muscle tone. Coordination normal.  Skin: Skin is warm and dry.  Psychiatric: He has a normal mood and affect. His behavior is normal.  Nursing note and vitals reviewed.   ED Course  Procedures (including critical care time) DIAGNOSTIC STUDIES: Oxygen Saturation is 91% on RA, adequate by my interpretation.    COORDINATION OF CARE: 8:59 AM- Pt advised of plan for treatment and pt agrees.  Labs Review Labs Reviewed  BASIC METABOLIC PANEL - Abnormal; Notable for the following:  Sodium 132 (*)    Chloride 99 (*)    CO2 20 (*)    Creatinine, Ser 1.55 (*)    GFR calc non Af Amer 45 (*)    GFR calc Af Amer 53 (*)    All other components within normal limits  CBC - Abnormal; Notable for the following:    Hemoglobin 12.6 (*)    HCT 38.0 (*)    All other components within normal limits   Results for orders placed or performed during the hospital encounter of 45/40/98  Basic metabolic panel  Result Value Ref Range   Sodium 132 (L) 135 - 145 mmol/L   Potassium 3.6 3.5 - 5.1 mmol/L   Chloride 99 (L) 101 - 111 mmol/L   CO2 20 (L) 22 - 32 mmol/L   Glucose, Bld 98 65 - 99 mg/dL   BUN 9 6 - 20 mg/dL   Creatinine, Ser 1.55 (H) 0.61 - 1.24 mg/dL   Calcium 9.4 8.9 - 10.3 mg/dL   GFR calc non Af Amer 45 (L) >60 mL/min   GFR calc Af Amer 53 (L) >60 mL/min   Anion gap 13 5 - 15  CBC  Result Value Ref Range   WBC 6.9 4.0 - 10.5 K/uL   RBC 4.34 4.22 - 5.81 MIL/uL   Hemoglobin 12.6 (L) 13.0 - 17.0 g/dL   HCT 38.0 (L) 39.0 - 52.0 %   MCV 87.6 78.0 - 100.0 fL   MCH 29.0 26.0 - 34.0 pg   MCHC 33.2 30.0 - 36.0 g/dL   RDW 14.7 11.5 - 15.5 %    Platelets 225 150 - 400 K/uL    Imaging Review Dg Chest 2 View  07/24/2015   CLINICAL DATA:  64 year old male with cough and shortness of breath for 1 week.  EXAM: CHEST  2 VIEW  COMPARISON:  Chest x-ray 01/17/2015.  FINDINGS: Trace bilateral pleural effusions. No acute consolidative airspace disease. No evidence pulmonary edema. Heart size is normal. No pneumothorax. Upper mediastinal contours are within normal limits.  IMPRESSION: 1. Trace bilateral pleural effusions.   Electronically Signed   By: Vinnie Langton M.D.   On: 07/24/2015 10:27     EKG Interpretation None      MDM   Final diagnoses:  SOB (shortness of breath)  COPD exacerbation    Patient presented with wheezing EMS and given a nebulizer and 125 mg site Medrol. Patient given another nebulizer upon arrival here patient still with wheezing has actually got a little bit worse. Patient will receive a third nebulizer will require admission. Chest x-ray which trace bilateral effusions but no evidence of pneumonia. No leukocytosis. Patient ran out of his albuterol about a week ago. Patient has been given steroidal.  I personally performed the services described in this documentation, which was scribed in my presence. The recorded information has been reviewed and is accurate.     Fredia Sorrow, MD 07/24/15 1127   Discussed with Dr. Caryn Section. Will omit to step down. She would like for patient to have antibodies although no distinct pneumonia just because of the persistent cough patient will be given Rocephin and Zithromax. Patient will also receive arterial blood gas. Her admit orders for stepdown completed.  Fredia Sorrow, MD 07/24/15 1149

## 2015-07-24 NOTE — ED Notes (Signed)
Pt c/o sob and cough for the past few days.  Reports worse last night.  EMS reports they gave a total of 3 albuterol, 1 atrovent treatment, and 125mg  solumedrol IV.

## 2015-07-24 NOTE — H&P (Signed)
[facility] History and Physical  Matthew Molina FOY:[id number] DOB: 03-09-1951 DOA: 07/24/2015  Referring physician: ED physician, Dr. Rogene Houston PCP: [name], MD   Chief Complaint: Shortness of breath  HPI: Matthew Molina is a 64 y.o. male with a history of oxygen dependent COPD, ongoing tobacco use, diabetes mellitus, and schizophrenia, who presents with a chief complaint of shortness of breath. History is of breath started approximately 1 week ago and has become progressively worse. He ran out of Symbicort and Spiriva last week. He has been using his albuterol nebulizer 2-3 times daily, but it has not been helping. He has had both a dry and productive cough, but he does not recall the color of his sputum. He denies chest pain, pleurisy, fever, chills, nausea, vomiting, diarrhea, abdominal pain, or pain with urination. He continues to smoke 1-1/2 packs of cigarettes daily.  In route to the ED, by EMS, the patient was apparently given a total of 3 albuterol and one Atrovent treatment along with 125 mg of IV Solu-Medrol. In the ED, his ABG reveals a pH of 7.36, PCO2 of 28, and PO2 of 70 on 4 L of nasal cannula oxygen. His chest x-ray reveals a trace bilateral pleural effusions. His other lab results are notable for a sodium of 132, creatinine 1.55, hemoglobin of 12.6, and normal venous glucose of 98. He is being admitted for further evaluation and management.    Review of Systems:  As above in history present illness. In addition, the patient denies any auditory or visual hallucinations. Otherwise review of systems is negative.  Past Medical History  Diagnosis Date  . Nicotine addiction   . Schizophrenia   . Hyperlipidemia   . Obesity   . Hypertension   . Diabetes mellitus   . COPD (chronic obstructive pulmonary disease)   . Arthritis   . Acute respiratory failure with hypoxia   . Acute kidney injury   . Multiple lung nodules on CT 04/02/2015   Past Surgical History    Procedure Laterality Date  . Colonoscopy    . Colonoscopy N/A 04/01/2013    Procedure: COLONOSCOPY;  Surgeon: Danie Binder, MD;  Location: [facility];  Service: Endoscopy;  Laterality: N/A;  10:00 AM-moved to [place] notified pt  . Cataract extraction w/phaco Left 11/20/2013    Procedure: CATARACT EXTRACTION PHACO AND INTRAOCULAR LENS PLACEMENT (IOC);  Surgeon: Tonny Branch, MD;  Location: [facility] ORS;  Service: Ophthalmology;  Laterality: Left;  CDE:10.26  . Cataract extraction w/phaco Right 12/08/2013    Procedure: RIGHT EYE CATARACT EXTRACTION PHACO AND INTRAOCULAR LENS PLACEMENT ;  Surgeon: Tonny Branch, MD;  Location: [facility];  Service: Ophthalmology;  Laterality: Right;  CDE 12.38  . Eye surgery Left 11/2013    cataract extraction   Social History: Patient lives alone in [place]. He is single. He has 2 children. He is disabled. He smokes 1-1/2 packs of cigarettes per day. He denies illicit drug use or alcohol use.    Allergies  Allergen Reactions  . Sertraline Hcl     Stomach upset/pain    Family History  Problem Relation Age of Onset  . Diabetes Mother   . Hypertension Mother   . Stroke Mother   . Diabetes Sister   . Heart disease Sister   . Kidney disease Father     Prior to Admission medications   Medication Sig Start Date End Date Taking? Authorizing Provider  amLODipine (NORVASC) 10 MG tablet TAKE (1) TABLET BY MOUTH  DAILY. 06/17/15  Yes Fayrene Helper, MD  aspirin EC 81 MG tablet Take 81 mg by mouth daily.   Yes Historical Provider, MD  busPIRone (BUSPAR) 15 MG tablet Take 1 tablet (15 mg total) by mouth 2 (two) times daily. 06/03/15  Yes Cloria Spring, MD  glipiZIDE (GLUCOTROL XL) 10 MG 24 hr tablet TAKE 1 TABLET BY MOUTH DAILY. 06/17/15  Yes Fayrene Helper, MD  ipratropium-albuterol (DUONEB) 0.5-2.5 (3) MG/3ML SOLN Take 3 mLs by nebulization every 4 (four) hours as needed. Patient taking differently: Take 3 mLs by nebulization every 4 (four) hours as needed  (shortness of breath).  01/20/15  Yes Lezlie Octave Black, NP  metFORMIN (GLUCOPHAGE) 1000 MG tablet TAKE 1 TABLET BY MOUTH TWICE DAILY WITH FOOD FOR DIABETES. 06/17/15  Yes Fayrene Helper, MD  polyethylene glycol powder (GLYCOLAX/MIRALAX) powder Take 17 g by mouth daily. 11/30/14  Yes Fayrene Helper, MD  pravastatin (PRAVACHOL) 40 MG tablet TAKE (1) TABLET BY MOUTH AT BEDTIME FOR CHOLESTEROL. 06/09/15  Yes Fayrene Helper, MD  PROAIR HFA 108 (90 BASE) MCG/ACT inhaler INHALE 2 PUFFS EVERY 6 HOURS AS NEEDED FOR WHEEZING OR SHORTNESS OF BREATH. 07/01/15  Yes Fayrene Helper, MD  risperiDONE (RISPERDAL) 1 MG tablet Take 1 tablet (1 mg total) by mouth at bedtime. 06/03/15  Yes Cloria Spring, MD  risperidone (RISPERDAL) 4 MG tablet Take 1 tablet (4 mg total) by mouth at bedtime. 06/03/15  Yes Cloria Spring, MD  SPIRIVA HANDIHALER 18 MCG inhalation capsule INHALE 1 CAPSULE AS DIRECTED ONCE A DAY. 02/22/15  Yes Fayrene Helper, MD  SYMBICORT 80-4.5 MCG/ACT inhaler INHALE 2 PUFFS INTO LUNGS TWICE DAILY. 04/19/15  Yes Fayrene Helper, MD  triamterene-hydrochlorothiazide (MAXZIDE-25) 37.5-25 MG per tablet TAKE (1) TABLET BY MOUTH ONCE DAILY. 06/17/15  Yes Fayrene Helper, MD  valsartan (DIOVAN) 320 MG tablet TAKE ONE TABLET BY MOUTH DAILY. 06/17/15  Yes Fayrene Helper, MD  ZETIA 10 MG tablet TAKE 1 TABLET BY MOUTH ONCE DAILY. 06/17/15  Yes Fayrene Helper, MD  ACCU-CHEK AVIVA PLUS test strip TEST BLOOD SUGAR ONCE DAILY AS DIRECTED. 12/14/14   Fayrene Helper, MD   Physical Exam: Filed Vitals:   07/24/15 1130 07/24/15 1139 07/24/15 1230 07/24/15 1259  BP: 110/91  130/99 123/89  Pulse: 100  119   Temp:      TempSrc:      Resp: 20  26 27   Height:      Weight:      SpO2: 93% 94% 100% 95%    Wt Readings from Last 3 Encounters:  07/24/15 113.399 kg (250 lb)  06/03/15 115.214 kg (254 lb)  05/06/15 113.853 kg (251 lb)    General:  64 year old African-American man in no acute  distress. Eyes: PERRL, normal lids, irises & conjunctiva; conjunctivae are clear and sclerae are white. ENT: grossly normal hearing; oropharynx mucous membranes are mildly dry, multiple missing teeth. Neck: no LAD, masses or thyromegaly Cardiovascular: S1, S2, with tachycardia. No LE edema. Telemetry: Sinus tachycardia. Respiratory: Decreased airflow in the bases, otherwise bilateral crackly wheezes; breathing mildly labored with speaking. Abdomen: Positive bowel sounds, soft, nontender, nondistended. Skin: no rash or induration seen on limited exam Musculoskeletal: grossly normal tone BUE/BLE; no acute hot red joints. Psychiatric: Flat affect, speech fluent and appropriate Neurologic: grossly non-focal; cranial nerves II through XII are intact.           Labs on Admission:  Basic Metabolic Panel:  Recent Labs Lab 07/24/15 0905  NA 132*  K 3.6  CL 99*  CO2 20*  GLUCOSE 98  BUN 9  CREATININE 1.55*  CALCIUM 9.4   Liver Function Tests: No results for input(s): AST, ALT, ALKPHOS, BILITOT, PROT, ALBUMIN in the last 168 hours. No results for input(s): LIPASE, AMYLASE in the last 168 hours. No results for input(s): AMMONIA in the last 168 hours. CBC:  Recent Labs Lab 07/24/15 0905  WBC 6.9  HGB 12.6*  HCT 38.0*  MCV 87.6  PLT 225   Cardiac Enzymes: No results for input(s): CKTOTAL, CKMB, CKMBINDEX, TROPONINI in the last 168 hours.  BNP (last 3 results)  Recent Labs  01/17/15 1705  BNP 16.0    ProBNP (last 3 results) No results for input(s): PROBNP in the last 8760 hours.  CBG: No results for input(s): GLUCAP in the last 168 hours.  Radiological Exams on Admission: Dg Chest 2 View  07/24/2015   CLINICAL DATA:  64 year old male with cough and shortness of breath for 1 week.  EXAM: CHEST  2 VIEW  COMPARISON:  Chest x-ray 01/17/2015.  FINDINGS: Trace bilateral pleural effusions. No acute consolidative airspace disease. No evidence pulmonary edema. Heart size is  normal. No pneumothorax. Upper mediastinal contours are within normal limits.  IMPRESSION: 1. Trace bilateral pleural effusions.   Electronically Signed   By: Vinnie Langton M.D.   On: 07/24/2015 10:27    EKG: Independently reviewed.   Assessment/Plan Principal Problem:   COPD with exacerbation Active Problems:   Acute on chronic respiratory failure with hypoxia   Tobacco abuse   AKI (acute kidney injury)   Hyponatremia   Schizophrenia, unspecified type   Essential hypertension   Diabetes mellitus type 2 in obese   GAD (generalized anxiety disorder)   -The patient was given IV Solu-Medrol and a number of albuterol/Atrovent treatments in route by EMS and in the ED. He was also given Rocephin and azithromycin in the ED.  -We'll continue treatment with IV Solu-Medrol, Atrovent/Xopenex nebulizer every 4 hours, Rocephin/azithromycin, oxygen titrated to keep his oxygen saturations at 90% or above. We'll hold home Spiriva. Will continue home Symbicort dosing.  -We'll add Tessalon Perles and Tussionex for cough.  -The patient was advised to stop smoking. Will order tobacco cessation counseling. Will order a nicotine patch.  - For presumed hypovolemic hyponatremia and acute kidney injury secondary to prerenal azotemia, will treat with normal saline infusion. We'll hold Maxzide.  -We'll treat his diabetes with sliding scale NovoLog and Lantus. We'll hold glipizide and metformin.  -We'll continue his psychotropic medications. -We'll continue his antihypertensives medications with exception of Maxzide.     Code Status: Full code DVT Prophylaxis: Lovenox Family Communication: Family not available Disposition Plan: Discharge when clinically appropriate  Time spent: One hour  [facility] Pager [phone]

## 2015-07-25 DIAGNOSIS — J441 Chronic obstructive pulmonary disease with (acute) exacerbation: Principal | ICD-10-CM

## 2015-07-25 DIAGNOSIS — E669 Obesity, unspecified: Secondary | ICD-10-CM

## 2015-07-25 DIAGNOSIS — E871 Hypo-osmolality and hyponatremia: Secondary | ICD-10-CM

## 2015-07-25 DIAGNOSIS — J9621 Acute and chronic respiratory failure with hypoxia: Secondary | ICD-10-CM

## 2015-07-25 DIAGNOSIS — E119 Type 2 diabetes mellitus without complications: Secondary | ICD-10-CM

## 2015-07-25 DIAGNOSIS — N183 Chronic kidney disease, stage 3 (moderate): Secondary | ICD-10-CM

## 2015-07-25 LAB — COMPREHENSIVE METABOLIC PANEL
ALT: 32 U/L (ref 17–63)
AST: 23 U/L (ref 15–41)
Albumin: 3.8 g/dL (ref 3.5–5.0)
Alkaline Phosphatase: 42 U/L (ref 38–126)
Anion gap: 13 (ref 5–15)
BUN: 20 mg/dL (ref 6–20)
CO2: 21 mmol/L — ABNORMAL LOW (ref 22–32)
Calcium: 9.3 mg/dL (ref 8.9–10.3)
Chloride: 98 mmol/L — ABNORMAL LOW (ref 101–111)
Creatinine, Ser: 1.53 mg/dL — ABNORMAL HIGH (ref 0.61–1.24)
GFR calc Af Amer: 53 mL/min — ABNORMAL LOW (ref >60)
GFR calc non Af Amer: 46 mL/min — ABNORMAL LOW (ref >60)
Glucose, Bld: 209 mg/dL — ABNORMAL HIGH (ref 65–99)
Potassium: 4.2 mmol/L (ref 3.5–5.1)
Sodium: 132 mmol/L — ABNORMAL LOW (ref 135–145)
Total Bilirubin: 0.5 mg/dL (ref 0.3–1.2)
Total Protein: 7.5 g/dL (ref 6.5–8.1)

## 2015-07-25 LAB — CBC
HCT: 35.6 % — ABNORMAL LOW (ref 39.0–52.0)
Hemoglobin: 11.9 g/dL — ABNORMAL LOW (ref 13.0–17.0)
MCH: 29.2 pg (ref 26.0–34.0)
MCHC: 33.4 g/dL (ref 30.0–36.0)
MCV: 87.3 fL (ref 78.0–100.0)
Platelets: 238 10*3/uL (ref 150–400)
RBC: 4.08 MIL/uL — ABNORMAL LOW (ref 4.22–5.81)
RDW: 14.6 % (ref 11.5–15.5)
WBC: 14.6 10*3/uL — ABNORMAL HIGH (ref 4.0–10.5)

## 2015-07-25 LAB — GLUCOSE, CAPILLARY
Glucose-Capillary: 200 mg/dL — ABNORMAL HIGH (ref 65–99)
Glucose-Capillary: 285 mg/dL — ABNORMAL HIGH (ref 65–99)
Glucose-Capillary: 272 mg/dL — ABNORMAL HIGH (ref 65–99)
Glucose-Capillary: 258 mg/dL — ABNORMAL HIGH (ref 65–99)

## 2015-07-25 MED ORDER — SODIUM BICARBONATE 650 MG PO TABS
650.0000 mg | ORAL_TABLET | Freq: Two times a day (BID) | ORAL | Status: DC
  Administered 2015-07-25 – 2015-07-26 (×3): 650 mg via ORAL
  Filled 2015-07-25 (×3): qty 1

## 2015-07-25 MED ORDER — GUAIFENESIN-DM 100-10 MG/5ML PO SYRP
5.0000 mL | ORAL_SOLUTION | ORAL | Status: DC | PRN
  Administered 2015-07-25: 5 mL via ORAL
  Filled 2015-07-25: qty 5

## 2015-07-25 MED ORDER — INSULIN ASPART 100 UNIT/ML ~~LOC~~ SOLN
0.0000 [IU] | Freq: Three times a day (TID) | SUBCUTANEOUS | Status: DC
  Administered 2015-07-25 (×2): 11 [IU] via SUBCUTANEOUS
  Administered 2015-07-26: 2 [IU] via SUBCUTANEOUS
  Administered 2015-07-26: 7 [IU] via SUBCUTANEOUS

## 2015-07-25 MED ORDER — LEVALBUTEROL HCL 0.63 MG/3ML IN NEBU
0.6300 mg | INHALATION_SOLUTION | RESPIRATORY_TRACT | Status: DC
Start: 2015-07-25 — End: 2015-07-26
  Administered 2015-07-25 – 2015-07-26 (×5): 0.63 mg via RESPIRATORY_TRACT
  Filled 2015-07-25 (×5): qty 3

## 2015-07-25 MED ORDER — PREDNISONE 20 MG PO TABS
40.0000 mg | ORAL_TABLET | Freq: Every day | ORAL | Status: DC
  Administered 2015-07-26: 40 mg via ORAL
  Filled 2015-07-25: qty 2

## 2015-07-25 NOTE — Progress Notes (Signed)
Pt a/o.vss. Iv patent. No complaints of any distress. Report given to [place], Therapist, sports. Caregiver verbalized understanding of report.  Pt to be transferred via wheelchair to room 336.

## 2015-07-25 NOTE — Progress Notes (Signed)
PROGRESS NOTE  Matthew Molina:[id number] DOB: 05/03/1951 DOA: 07/24/2015 PCP: [name], MD  Summary: 51 yom with a history of oxygen dependent COPD, ongoing tobacco use, DM, and schizophrenia presented with complaints of SOB with dry and productive cough. Admitted for COPD exacerbation, AKI.  Assessment/Plan: 1. COPD with exacerbation, ran out of inhaler last week. Improved. 2. Acute on chronic respiratory failure with hypoxia, on 2L [place] at home, Improved to baseline oxygen requirement 2L. 3. CKD stage III, stable. 4. Hyponatremia, chronic, likely secondary to [facility]. 5. Non-AG metabolic acidosis with respiratory compensation. Likely related to CKD.  6. Tobacco dependence, recommend cessation. 7. DM. Type 2. Stable   Overall improved.  Continue steroids, breathing treatment and chronic oxygen.  Oral sodium bicarbonate, check BMP in the am.  Change SSI to a resistant. Resume glipizide and metformin on discharge.  Transfer to medical floor.  Anticipate discharge in the next 24 hours.  Code Status: Full DVT prophylaxis:  Lovenox Family Communication: Discussed with patient. He understands and has no concerns. Disposition Plan: Anticipate discharge home in the next 24 hours.  Murray Hodgkins, MD  [facility]  Pager [phone] If 7PM-7AM, please contact night-coverage at [email or web address], password [facility] 07/25/2015, 7:58 AM  LOS: 1 day   Consultants:    Procedures:     Antibiotics:   Rocephin 07/25/15>>08/01/15  Azithromycin 07/24/15>>  HPI/Subjective Feels better. Coughing with deep breath but no SOB. No reports of chest pain, n/v/d, or abd pain.  Objective: Filed Vitals:   07/25/15 0456 07/25/15 0500 07/25/15 0600 07/25/15 0700  BP:  121/79 99/51 106/61  Pulse:  90 80 78  Temp:      TempSrc:      Resp:  25 19 17   Height:      Weight:   113.7 kg (250 lb 10.6 oz)   SpO2: 93% 96% 93% 96%    Intake/Output Summary (Last 24 hours) at 07/25/15 0758 Last  data filed at 07/25/15 0445  Gross per 24 hour  Intake 1708.33 ml  Output   4600 ml  Net -2891.67 ml     Filed Weights   07/24/15 0819 07/25/15 0600  Weight: 113.399 kg (250 lb) 113.7 kg (250 lb 10.6 oz)    Exam:  Afebrile, not hypoxic on 2L [place]. General:  Appears calm and comfortable, sitting on edge of the bed. Eyes: PERRL, normal lids, irises & conjunctiva  ENT: grossly normal hearing, lips & tongue Cardiovascular: RRR, no m/r/g. No LE edema. Telemetry: SR  Respiratory: CTA bilaterally, no w/r/r. Normal respiratory effort, speaks in full sentences. Abdomen: soft, ntnd  Musculoskeletal: grossly normal tone BUE/BLE Psychiatric: grossly normal mood and affect, speech fluent and appropriate Neurologic: grossly non-focal.  New data reviewed:  Sodium 132  Chloride 98  Glucose 209  BS stable, 200s  BMP without significant change CO2 21 and creatinine 1.53.  WBC 14.6, secondary to steroid.  HGB stable 11.9  Pertinent data since admission:  Chest XR Independently reviewed -Impressions. Trace bilateral pleural effusions. NAD  Pending data:    Scheduled Meds: . amLODipine  10 mg Oral Daily  . antiseptic oral rinse  7 mL Mouth Rinse BID  . aspirin EC  81 mg Oral Daily  . azithromycin  500 mg Intravenous Q24H  . benzonatate  100 mg Oral TID  . budesonide-formoterol  2 puff Inhalation BID  . busPIRone  15 mg Oral BID  . cefTRIAXone (ROCEPHIN)  IV  2 g Intravenous Q24H  . chlorpheniramine-HYDROcodone  5 mL  Oral Q12H  . enoxaparin (LOVENOX) injection  55 mg Subcutaneous Daily  . ezetimibe  10 mg Oral Daily  . insulin aspart  0-15 Units Subcutaneous TID WC  . insulin aspart  0-5 Units Subcutaneous QHS  . insulin glargine  10 Units Subcutaneous QHS  . ipratropium  0.5 mg Nebulization Q4H WA  . irbesartan  300 mg Oral Daily  . levalbuterol  0.63 mg Nebulization QID  . methylPREDNISolone (SOLU-MEDROL) injection  80 mg Intravenous Q6H  . nicotine  21 mg Transdermal  Daily  . polyethylene glycol  17 g Oral Daily  . pravastatin  40 mg Oral q1800  . risperiDONE  4 mg Oral QHS   Continuous Infusions: . 0.9 % NaCl with KCl 20 mEq / L 75 mL/hr at 07/25/15 0400    Principal Problem:   COPD with exacerbation Active Problems:   Schizophrenia, unspecified type   Essential hypertension   Diabetes mellitus type 2 in obese   GAD (generalized anxiety disorder)   Acute on chronic respiratory failure with hypoxia   Tobacco abuse   AKI (acute kidney injury)   Hyponatremia   Time spent 25 minutes  I, Jessica D. Leonie Green, acting as scribe, recorded this note contemporaneously in the presence of Dr. Melene Plan. Sarajane Jews, M.D. on 07/25/2015 .  I have reviewed the above documentation for accuracy and completeness, and I agree with the above. Murray Hodgkins, MD

## 2015-07-26 LAB — BASIC METABOLIC PANEL
Anion gap: 8 (ref 5–15)
BUN: 20 mg/dL (ref 6–20)
CO2: 25 mmol/L (ref 22–32)
Calcium: 9.4 mg/dL (ref 8.9–10.3)
Chloride: 102 mmol/L (ref 101–111)
Creatinine, Ser: 1.31 mg/dL — ABNORMAL HIGH (ref 0.61–1.24)
GFR calc Af Amer: >60 mL/min (ref >60)
GFR calc non Af Amer: 56 mL/min — ABNORMAL LOW (ref >60)
Glucose, Bld: 226 mg/dL — ABNORMAL HIGH (ref 65–99)
Potassium: 4.1 mmol/L (ref 3.5–5.1)
Sodium: 135 mmol/L (ref 135–145)

## 2015-07-26 LAB — GLUCOSE, CAPILLARY
Glucose-Capillary: 226 mg/dL — ABNORMAL HIGH (ref 65–99)
Glucose-Capillary: 229 mg/dL — ABNORMAL HIGH (ref 65–99)

## 2015-07-26 LAB — HEMOGLOBIN A1C
Hgb A1c MFr Bld: 6.3 % — ABNORMAL HIGH (ref 4.8–5.6)
Mean Plasma Glucose: 134 mg/dL

## 2015-07-26 MED ORDER — BUDESONIDE-FORMOTEROL FUMARATE 80-4.5 MCG/ACT IN AERO: INHALATION_SPRAY | RESPIRATORY_TRACT | Status: DC

## 2015-07-26 MED ORDER — GLIPIZIDE ER 10 MG PO TB24: 10.0000 mg | ORAL_TABLET | Freq: Every day | ORAL | Status: DC

## 2015-07-26 MED ORDER — AZITHROMYCIN 500 MG PO TABS: ORAL_TABLET | ORAL | Status: DC

## 2015-07-26 MED ORDER — EZETIMIBE 10 MG PO TABS: 10.0000 mg | ORAL_TABLET | Freq: Every day | ORAL | Status: DC

## 2015-07-26 MED ORDER — PREDNISONE 10 MG PO TABS: ORAL_TABLET | ORAL | Status: DC

## 2015-07-26 MED ORDER — BUSPIRONE HCL 15 MG PO TABS: 15.0000 mg | ORAL_TABLET | Freq: Two times a day (BID) | ORAL | Status: DC

## 2015-07-26 MED ORDER — TRIAMTERENE-HCTZ 37.5-25 MG PO TABS: ORAL_TABLET | ORAL | Status: DC

## 2015-07-26 MED ORDER — HYDROCOD POLST-CPM POLST ER 10-8 MG/5ML PO SUER: 5.0000 mL | Freq: Two times a day (BID) | ORAL | Status: DC

## 2015-07-26 MED ORDER — RISPERIDONE 4 MG PO TABS: 4.0000 mg | ORAL_TABLET | Freq: Every day | ORAL | Status: DC

## 2015-07-26 MED ORDER — METFORMIN HCL 1000 MG PO TABS: ORAL_TABLET | ORAL | Status: DC

## 2015-07-26 MED ORDER — RISPERIDONE 1 MG PO TABS: 1.0000 mg | ORAL_TABLET | Freq: Every day | ORAL | Status: DC

## 2015-07-26 MED ORDER — PRAVASTATIN SODIUM 40 MG PO TABS: ORAL_TABLET | ORAL | Status: DC

## 2015-07-26 MED ORDER — PREDNISONE 10 MG PO TABS
ORAL_TABLET | ORAL | Status: DC
Start: 2015-07-26 — End: 2015-07-26

## 2015-07-26 MED ORDER — BENZONATATE 100 MG PO CAPS: 100.0000 mg | ORAL_CAPSULE | Freq: Three times a day (TID) | ORAL | Status: DC

## 2015-07-26 MED ORDER — OXYCODONE HCL 5 MG PO TABS
5.0000 mg | ORAL_TABLET | ORAL | Status: DC | PRN
Start: 2015-07-26 — End: 2015-07-26

## 2015-07-26 MED ORDER — VALSARTAN 320 MG PO TABS: 320.0000 mg | ORAL_TABLET | Freq: Every day | ORAL | Status: DC

## 2015-07-26 MED ORDER — NICOTINE 21 MG/24HR TD PT24: 21.0000 mg | MEDICATED_PATCH | Freq: Every day | TRANSDERMAL | Status: DC

## 2015-07-26 MED ORDER — AMLODIPINE BESYLATE 10 MG PO TABS: ORAL_TABLET | ORAL | Status: DC

## 2015-07-26 NOTE — Care Management Important Message (Signed)
Important Message  Patient Details  Name: Matthew Molina MRN: [id number] Date of Birth: [birth year]   Medicare Important Message Given:  N/A - LOS <3 / Initial given by admissions    Sherald Barge, RN 07/26/2015, 1:09 PM

## 2015-07-26 NOTE — Care Management Note (Signed)
Case Management Note  Patient Details  Name: Matthew Molina MRN: [id number] Date of Birth: [birth year]  Expected Discharge Date:                  Expected Discharge Plan:  Home/Self Care  In-House Referral:  NA  Discharge planning Services  CM Consult  Post Acute Care Choice:  NA Choice offered to:  NA  DME Arranged:    DME Agency:     HH Arranged:    [place] Agency:     Status of Service:  Completed, signed off  Medicare Important Message Given:  N/A - LOS <3 / Initial given by admissions Date Medicare IM Given:    Medicare IM give by:    Date Additional Medicare IM Given:    Additional Medicare Important Message give by:     If discussed at [place] of Stay Meetings, dates discussed:    Additional Comments: Pt is from home, independent with assistance with ADL's. Pt has CAP aid that comes 7 days a week. Pt has home O2 and neb machine. Pt has cane and walker. Pt plans to discharge home today with CAP aid service. No CM needs.  Sherald Barge, RN 07/26/2015, 1:09 PM

## 2015-07-26 NOTE — Progress Notes (Signed)
Patient still has cough but only an occasional wheeze noted.

## 2015-07-26 NOTE — Discharge Summary (Signed)
Physician Discharge Summary  LILBURN STRAW NWG:[id number] DOB: Jun 03, 1951 DOA: 07/24/2015  PCP: [name], MD  Admit date: 07/24/2015 Discharge date: 07/26/2015  Time spent: 40 minutes  Recommendations for Outpatient Follow-up:  1. Follow up with Dr Luan Pulling 1 week pulmonology for evaluation of COPD exacerbation 2. Follow up with PCP 2-3 weeks for trending of CKD, electrolytes control of diabetes  Discharge Diagnoses:  Principal Problem:   COPD with exacerbation Active Problems:   Schizophrenia, unspecified type   Essential hypertension   Diabetes mellitus type 2 in obese   GAD (generalized anxiety disorder)   Acute on chronic respiratory failure with hypoxia   Tobacco abuse   AKI (acute kidney injury)   Hyponatremia   Discharge Condition: stable  Diet recommendation: carb modified heart healthy  Filed Weights   07/24/15 0819 07/25/15 0600 07/26/15 0648  Weight: 113.399 kg (250 lb) 113.7 kg (250 lb 10.6 oz) 114.034 kg (251 lb 6.4 oz)    History of present illness:  Matthew Molina is a 64 y.o. male with a history of oxygen dependent COPD, ongoing tobacco use, diabetes mellitus, and schizophrenia, who presented to ED on 07/24/15 with a chief complaint of shortness of breath. SOB started approximately 1 week prior and  became progressively worse. He ran out of Symbicort and Spiriva last week. He had been using his albuterol nebulizer 2-3 times daily, but it was not helping. He had both a dry and productive cough, but he did not recall the color of his sputum. He denied chest pain, pleurisy, fever, chills, nausea, vomiting, diarrhea, abdominal pain, or pain with urination. He continued to smoke 1-1/2 packs of cigarettes daily.  In route to the ED, by EMS, the patient was apparently given a total of 3 albuterol and one Atrovent treatment along with 125 mg of IV Solu-Medrol. In the ED, his ABG revealed a pH of 7.36, PCO2 of 28, and PO2 of 70 on 4 L of nasal cannula oxygen. His chest  x-ray revealed a trace bilateral pleural effusions. His other lab results notable for a sodium of 132, creatinine 1.55, hemoglobin of 12.6, and normal venous glucose of 98.   Hospital Course:  1. COPD with exacerbation secondary to running out of inhaler last week. Provided with nebs, steroids and antibiotics. He improved and at discharge respiratory status at baseline. Will be discharged with prednisone taper antibiotics for 4 days to complete 7 day course.  Will follow up with Dr Luan Pulling 1 week.  2. Acute on chronic respiratory failure with hypoxia, on 2L [place] at home. Is on home oxygen 2L. At discharge oxygen saturation level 95% on 2L which is his baseline.  3. CKD stage III, stable. 4. Hyponatremia, chronic, likely secondary to [facility]. Resolved at discharge. OP follow up 5. Non-AG metabolic acidosis with respiratory compensation. Likely related to CKD. stable 6. Tobacco dependence, recommended cessation. Patch provided 7. DM. Type 2.  HgA1c in process at discharge. CBG range 229 at discharge likely related to steroids.   Procedures:  none  Consultations:  none  Discharge Exam: Filed Vitals:   07/26/15 0648  BP: 122/63  Pulse:   Temp: 97.9 F (36.6 C)  Resp: 16    General: obese alert appears comfortable Cardiovascular: RRR no m/g/r no LE edema Respiratory: normal effort with conversation. BS with good air flow slightly distant bases no wheeze or crackles  Discharge Instructions    Current Discharge Medication List    START taking these medications   Details  azithromycin (  ZITHROMAX) 500 MG tablet Take one tab daily starting 07/27/15. Qty: 4 tablet, Refills: o    nicotine (NICODERM CQ - DOSED IN MG/24 HOURS) 21 mg/24hr patch Place 1 patch (21 mg total) onto the skin daily. Qty: 28 patch, Refills: 0    predniSONE (DELTASONE) 10 MG tablet Take 4 tabs on 07/27/15 then take 3 tabs for 2 days then take 2 tabs for 2 days then take 1 tab for 2 days then stop Qty: 12 tablet,  Refills: 0      CONTINUE these medications which have CHANGED   Details  amLODipine (NORVASC) 10 MG tablet TAKE (1) TABLET BY MOUTH DAILY. Qty: 30 tablet, Refills: 0    budesonide-formoterol (SYMBICORT) 80-4.5 MCG/ACT inhaler INHALE 2 PUFFS INTO LUNGS TWICE DAILY. Qty: 10.2 g, Refills: 3    busPIRone (BUSPAR) 15 MG tablet Take 1 tablet (15 mg total) by mouth 2 (two) times daily.    ezetimibe (ZETIA) 10 MG tablet Take 1 tablet (10 mg total) by mouth daily.    glipiZIDE (GLUCOTROL XL) 10 MG 24 hr tablet Take 1 tablet (10 mg total) by mouth daily.    metFORMIN (GLUCOPHAGE) 1000 MG tablet TAKE 1 TABLET BY MOUTH TWICE DAILY WITH FOOD FOR DIABETES.    pravastatin (PRAVACHOL) 40 MG tablet TAKE (1) TABLET BY MOUTH AT BEDTIME FOR CHOLESTEROL.    !! risperiDONE (RISPERDAL) 1 MG tablet Take 1 tablet (1 mg total) by mouth at bedtime.    !! risperidone (RISPERDAL) 4 MG tablet Take 1 tablet (4 mg total) by mouth at bedtime.    triamterene-hydrochlorothiazide (MAXZIDE-25) 37.5-25 MG per tablet TAKE (1) TABLET BY MOUTH ONCE DAILY.    valsartan (DIOVAN) 320 MG tablet Take 1 tablet (320 mg total) by mouth daily.     !! - Potential duplicate medications found. Please discuss with provider.    CONTINUE these medications which have NOT CHANGED   Details  aspirin EC 81 MG tablet Take 81 mg by mouth daily.    ipratropium-albuterol (DUONEB) 0.5-2.5 (3) MG/3ML SOLN Take 3 mLs by nebulization every 4 (four) hours as needed. Qty: 360 mL, Refills: 1    polyethylene glycol powder (GLYCOLAX/MIRALAX) powder Take 17 g by mouth daily. Qty: 500 g, Refills: 3    PROAIR HFA 108 (90 BASE) MCG/ACT inhaler INHALE 2 PUFFS EVERY 6 HOURS AS NEEDED FOR WHEEZING OR SHORTNESS OF BREATH. Qty: 8.5 g, Refills: 2    SPIRIVA HANDIHALER 18 MCG inhalation capsule INHALE 1 CAPSULE AS DIRECTED ONCE A DAY. Qty: 30 capsule, Refills: 4    ACCU-CHEK AVIVA PLUS test strip TEST BLOOD SUGAR ONCE DAILY AS DIRECTED. Qty: 50 each,  Refills: 4       Allergies  Allergen Reactions  . Sertraline Hcl     Stomach upset/pain   Follow-up Information    Follow up with [name], MD.   Specialty:  [facility]   Contact information:   [address] [place] [address] [phone]       Follow up with HAWKINS,EDWARD L, MD.   Specialty:  Pulmonary Disease   Why:  follow up to COPD exacerbation   Contact information:   [place] [address] [phone]        The results of significant diagnostics from this hospitalization (including imaging, microbiology, ancillary and laboratory) are listed below for reference.    Significant Diagnostic Studies: Dg Chest 2 View  07/24/2015   CLINICAL DATA:  64 year old male with  cough and shortness of breath for 1 week.  EXAM: CHEST  2 VIEW  COMPARISON:  Chest x-ray 01/17/2015.  FINDINGS: Trace bilateral pleural effusions. No acute consolidative airspace disease. No evidence pulmonary edema. Heart size is normal. No pneumothorax. Upper mediastinal contours are within normal limits.  IMPRESSION: 1. Trace bilateral pleural effusions.   Electronically Signed   By: Vinnie Langton M.D.   On: 07/24/2015 10:27    Microbiology: Recent Results (from the past 240 hour(s))  MRSA PCR Screening     Status: None   Collection Time: 07/24/15  1:11 PM  Result Value Ref Range Status   MRSA by PCR NEGATIVE NEGATIVE Final    Comment:        The GeneXpert MRSA Assay (FDA approved for NASAL specimens only), is one component of a comprehensive MRSA colonization surveillance program. It is not intended to diagnose MRSA infection nor to guide or monitor treatment for MRSA infections.      Labs: Basic Metabolic Panel:  Recent Labs Lab 07/24/15 0905 07/25/15 0438 07/26/15 0643  NA 132* 132* 135  K 3.6 4.2 4.1  CL 99* 98* 102  CO2 20* 21* 25  GLUCOSE 98 209* 226*  BUN 9 20 20   CREATININE 1.55* 1.53* 1.31*  CALCIUM 9.4 9.3  9.4   Liver Function Tests:  Recent Labs Lab 07/25/15 0438  AST 23  ALT 32  ALKPHOS 42  BILITOT 0.5  PROT 7.5  ALBUMIN 3.8   No results for input(s): LIPASE, AMYLASE in the last 168 hours. No results for input(s): AMMONIA in the last 168 hours. CBC:  Recent Labs Lab 07/24/15 0905 07/25/15 0438  WBC 6.9 14.6*  HGB 12.6* 11.9*  HCT 38.0* 35.6*  MCV 87.6 87.3  PLT 225 238   Cardiac Enzymes: No results for input(s): CKTOTAL, CKMB, CKMBINDEX, TROPONINI in the last 168 hours. BNP: BNP (last 3 results)  Recent Labs  01/17/15 1705  BNP 16.0    ProBNP (last 3 results) No results for input(s): PROBNP in the last 8760 hours.  CBG:  Recent Labs Lab 07/25/15 1139 07/25/15 1620 07/25/15 2121 07/26/15 0732 07/26/15 1150  GLUCAP 285* 272* 258* 226* 229*       Signed:  [place]  [facility] 07/26/2015, 1:43 PM

## 2015-07-29 ENCOUNTER — Emergency Department (HOSPITAL_COMMUNITY)
Admission: EM | Admit: 2015-07-29 | Discharge: 2015-07-29 | Disposition: A | Discharge status: Home / Self Care | Payer: Commercial Managed Care - HMO | Attending: Emergency Medicine | Admitting: Emergency Medicine

## 2015-07-29 ENCOUNTER — Emergency Department (HOSPITAL_COMMUNITY): Payer: Commercial Managed Care - HMO

## 2015-07-29 ENCOUNTER — Encounter (HOSPITAL_COMMUNITY): Payer: Self-pay | Admitting: *Deleted

## 2015-07-29 DIAGNOSIS — J441 Chronic obstructive pulmonary disease with (acute) exacerbation: Secondary | ICD-10-CM | POA: Insufficient documentation

## 2015-07-29 DIAGNOSIS — F329 Major depressive disorder, single episode, unspecified: Secondary | ICD-10-CM | POA: Diagnosis not present

## 2015-07-29 DIAGNOSIS — Z7982 Long term (current) use of aspirin: Secondary | ICD-10-CM | POA: Diagnosis not present

## 2015-07-29 DIAGNOSIS — E119 Type 2 diabetes mellitus without complications: Secondary | ICD-10-CM | POA: Diagnosis not present

## 2015-07-29 DIAGNOSIS — Z79899 Other long term (current) drug therapy: Secondary | ICD-10-CM | POA: Diagnosis not present

## 2015-07-29 DIAGNOSIS — R0602 Shortness of breath: Secondary | ICD-10-CM | POA: Diagnosis present

## 2015-07-29 HISTORY — DX: Depression, unspecified: F32.A

## 2015-07-29 HISTORY — DX: Major depressive disorder, single episode, unspecified: F32.9

## 2015-07-29 LAB — CBC WITH DIFFERENTIAL/PLATELET
Basophils Absolute: 0 10*3/uL (ref 0.0–0.1)
Basophils Relative: 0 % (ref 0–1)
Eosinophils Absolute: 1.6 10*3/uL — ABNORMAL HIGH (ref 0.0–0.7)
Eosinophils Relative: 15 % — ABNORMAL HIGH (ref 0–5)
HCT: 39.6 % (ref 39.0–52.0)
Hemoglobin: 13 g/dL (ref 13.0–17.0)
Lymphocytes Relative: 32 % (ref 12–46)
Lymphs Abs: 3.5 10*3/uL (ref 0.7–4.0)
MCH: 28.8 pg (ref 26.0–34.0)
MCHC: 32.8 g/dL (ref 30.0–36.0)
MCV: 87.6 fL (ref 78.0–100.0)
Monocytes Absolute: 0.7 10*3/uL (ref 0.1–1.0)
Monocytes Relative: 6 % (ref 3–12)
Neutro Abs: 5.1 10*3/uL (ref 1.7–7.7)
Neutrophils Relative %: 47 % (ref 43–77)
Platelets: 257 10*3/uL (ref 150–400)
RBC: 4.52 MIL/uL (ref 4.22–5.81)
RDW: 14.8 % (ref 11.5–15.5)
WBC: 11 10*3/uL — ABNORMAL HIGH (ref 4.0–10.5)

## 2015-07-29 LAB — COMPREHENSIVE METABOLIC PANEL
ALT: 63 U/L (ref 17–63)
AST: 35 U/L (ref 15–41)
Albumin: 4.1 g/dL (ref 3.5–5.0)
Alkaline Phosphatase: 42 U/L (ref 38–126)
Anion gap: 14 (ref 5–15)
BUN: 15 mg/dL (ref 6–20)
CO2: 23 mmol/L (ref 22–32)
Calcium: 9.4 mg/dL (ref 8.9–10.3)
Chloride: 97 mmol/L — ABNORMAL LOW (ref 101–111)
Creatinine, Ser: 1.42 mg/dL — ABNORMAL HIGH (ref 0.61–1.24)
GFR calc Af Amer: 58 mL/min — ABNORMAL LOW (ref >60)
GFR calc non Af Amer: 50 mL/min — ABNORMAL LOW (ref >60)
Glucose, Bld: 153 mg/dL — ABNORMAL HIGH (ref 65–99)
Potassium: 3.7 mmol/L (ref 3.5–5.1)
Sodium: 134 mmol/L — ABNORMAL LOW (ref 135–145)
Total Bilirubin: 0.6 mg/dL (ref 0.3–1.2)
Total Protein: 7.4 g/dL (ref 6.5–8.1)

## 2015-07-29 LAB — BLOOD GAS, ARTERIAL
Acid-base deficit: 0.1 mmol/L (ref 0.0–2.0)
Bicarbonate: 23.4 mEq/L (ref 20.0–24.0)
Drawn by: 23534
FIO2: 0.21
O2 Content: 21 L/min
O2 Saturation: 91.1 %
Patient temperature: 37
TCO2: 20.7 mmol/L (ref 0–100)
pCO2 arterial: 34.2 mmHg — ABNORMAL LOW (ref 35.0–45.0)
pH, Arterial: 7.449 (ref 7.350–7.450)
pO2, Arterial: 59.8 mmHg — ABNORMAL LOW (ref 80.0–100.0)

## 2015-07-29 LAB — BRAIN NATRIURETIC PEPTIDE: B Natriuretic Peptide: 21 pg/mL (ref 0.0–100.0)

## 2015-07-29 LAB — TROPONIN I: Troponin I: <0.03 ng/mL (ref <0.031)

## 2015-07-29 MED ORDER — ALBUTEROL (5 MG/ML) CONTINUOUS INHALATION SOLN
15.0000 mg/h | INHALATION_SOLUTION | Freq: Once | RESPIRATORY_TRACT | Status: AC
  Administered 2015-07-29: 15 mg/h via RESPIRATORY_TRACT
  Filled 2015-07-29: qty 20

## 2015-07-29 MED ORDER — MAGNESIUM SULFATE 2 GM/50ML IV SOLN
2.0000 g | Freq: Once | INTRAVENOUS | Status: AC
  Administered 2015-07-29: 2 g via INTRAVENOUS
  Filled 2015-07-29: qty 50

## 2015-07-29 MED ORDER — PREDNISONE 50 MG PO TABS
ORAL_TABLET | ORAL | Status: DC
Start: 2015-07-29 — End: 2015-08-18

## 2015-07-29 MED ORDER — IPRATROPIUM BROMIDE 0.02 % IN SOLN
0.5000 mg | Freq: Once | RESPIRATORY_TRACT | Status: AC
  Administered 2015-07-29: 0.5 mg via RESPIRATORY_TRACT
  Filled 2015-07-29: qty 2.5

## 2015-07-29 MED ORDER — ALBUTEROL SULFATE HFA 108 (90 BASE) MCG/ACT IN AERS: 2.0000 | INHALATION_SPRAY | Freq: Four times a day (QID) | RESPIRATORY_TRACT | Status: DC | PRN

## 2015-07-29 NOTE — ED Notes (Addendum)
Patient tolerant walking, complained of shortness of breathe, maintain SpO2 stat 95

## 2015-07-29 NOTE — ED Notes (Addendum)
RT at bedside giving breathing tx and getting ABG.

## 2015-07-29 NOTE — ED Provider Notes (Signed)
CSN: [id number]     Arrival date & time 07/29/15  0945 History  This chart was scribed for Matthew Essex, MD by Julien Nordmann, ED Scribe. This patient was seen in room APA06/APA06 and the patient's care was started at 9:46 AM.    Chief Complaint  Patient presents with  . Shortness of Breath      The history is provided by the patient and the EMS personnel. No language interpreter was used.   HPI Comments: Matthew Molina is a 64 y.o. male who presents to the Emergency Department complaining of constant, gradual worsening shortness of breath onset this morning. He has an associated productive cough that is not bringing up any sputum. Pt was hospitalized this past weekend for same symptoms and was released Monday. He was brought in EMS and was given 1 albuterol, 125mg  solumedrol and 1 duoneb PTA which helped alleviate symptoms minimally. Pt uses 2 L/min of oxygen as needed when he is at home. Pt denies chest pain, abdominal pain, back pain, fever, vomiting, heart problems, and stents in heart.  Past Medical History  Diagnosis Date  . COPD (chronic obstructive pulmonary disease)   . Diabetes mellitus without complication   . Depression    Past Surgical History  Procedure Laterality Date  . Shoulder surgery     No family history on file. Social History  Substance Use Topics  . Smoking status: Former Research scientist (life sciences)  . Smokeless tobacco: None  . Alcohol Use: No    Review of Systems  A complete 10 system review of systems was obtained and all systems are negative except as noted in the HPI and PMH.    Allergies  Wellbutrin  Home Medications   Prior to Admission medications   Medication Sig Start Date End Date Taking? Authorizing Provider  amLODipine (NORVASC) 10 MG tablet Take 10 mg by mouth daily.   Yes Historical Provider, MD  aspirin EC 81 MG tablet Take 81 mg by mouth daily.   Yes Historical Provider, MD  budesonide-formoterol (SYMBICORT) 80-4.5 MCG/ACT inhaler Inhale 2 puffs into  the lungs 2 (two) times daily.   Yes Historical Provider, MD  busPIRone (BUSPAR) 15 MG tablet Take 15 mg by mouth daily.   Yes Historical Provider, MD  ezetimibe (ZETIA) 10 MG tablet Take 10 mg by mouth daily.   Yes Historical Provider, MD  glipiZIDE (GLUCOTROL XL) 10 MG 24 hr tablet Take 10 mg by mouth daily with breakfast.   Yes Historical Provider, MD  metFORMIN (GLUCOPHAGE) 1000 MG tablet Take 1,000 mg by mouth 2 (two) times daily with a meal.   Yes Historical Provider, MD  pravastatin (PRAVACHOL) 40 MG tablet Take 40 mg by mouth daily.   Yes Historical Provider, MD  risperiDONE (RISPERDAL) 1 MG tablet Take 1 mg by mouth at bedtime. With 4mg    Yes Historical Provider, MD  risperidone (RISPERDAL) 4 MG tablet Take 4 mg by mouth 2 (two) times daily.   Yes Historical Provider, MD  triamterene-hydrochlorothiazide (MAXZIDE-25) 37.5-25 MG per tablet Take 1 tablet by mouth daily.   Yes Historical Provider, MD  valsartan (DIOVAN) 320 MG tablet Take 320 mg by mouth daily.   Yes Historical Provider, MD  albuterol (PROVENTIL HFA;VENTOLIN HFA) 108 (90 BASE) MCG/ACT inhaler Inhale 2 puffs into the lungs every 6 (six) hours as needed for wheezing or shortness of breath. 07/29/15   Matthew Essex, MD  predniSONE (DELTASONE) 50 MG tablet 1 tablet PO daily 07/29/15   Matthew Essex, MD   Triage  vitals: BP 102/73 mmHg  Pulse 128  Temp(Src) 98.1 F (36.7 C) (Oral)  Resp 32  Ht 6\' 1"  (1.854 m)  Wt 250 lb (113.399 kg)  BMI 32.99 kg/m2  SpO2 94% Physical Exam  Constitutional: He is oriented to person, place, and time. He appears well-developed and well-nourished. No distress.  HENT:  Head: Normocephalic and atraumatic.  Mouth/Throat: Oropharynx is clear and moist. No oropharyngeal exudate.  Eyes: Conjunctivae and EOM are normal. Pupils are equal, round, and reactive to light.  Neck: Normal range of motion. Neck supple.  No meningismus.  Cardiovascular: Normal rate, regular rhythm, normal heart sounds and  intact distal pulses.   No murmur heard. Pulmonary/Chest: He is in respiratory distress. He has wheezes.  Frequent dry cough, decreased breath sounds with tight respiratory wheezing, no peripheral edema, talking in short sentences  Abdominal: Soft. There is no tenderness. There is no rebound and no guarding.  Musculoskeletal: Normal range of motion. He exhibits no edema or tenderness.  Neurological: He is alert and oriented to person, place, and time. No cranial nerve deficit. He exhibits normal muscle tone. Coordination normal.  No ataxia on finger to nose bilaterally. No pronator drift. 5/5 strength throughout. CN 2-12 intact. Negative Romberg. Equal grip strength. Sensation intact. Gait is normal.   Skin: Skin is warm.  Psychiatric: He has a normal mood and affect. His behavior is normal.  Nursing note and vitals reviewed.   ED Course  Procedures  DIAGNOSTIC STUDIES: Oxygen Saturation is 94% on RA, low by my interpretation.  COORDINATION OF CARE:  9:51 AM Discussed treatment plan which includes albuterol, atrovent, and chest x-ray with pt at bedside and pt agreed to plan.  Labs Review Labs Reviewed  CBC WITH DIFFERENTIAL/PLATELET - Abnormal; Notable for the following:    WBC 11.0 (*)    Eosinophils Relative 15 (*)    Eosinophils Absolute 1.6 (*)    All other components within normal limits  COMPREHENSIVE METABOLIC PANEL - Abnormal; Notable for the following:    Sodium 134 (*)    Chloride 97 (*)    Glucose, Bld 153 (*)    Creatinine, Ser 1.42 (*)    GFR calc non Af Amer 50 (*)    GFR calc Af Amer 58 (*)    All other components within normal limits  BLOOD GAS, ARTERIAL - Abnormal; Notable for the following:    pCO2 arterial 34.2 (*)    pO2, Arterial 59.8 (*)    All other components within normal limits  TROPONIN I  BRAIN NATRIURETIC PEPTIDE    Imaging Review Dg Chest Portable 1 View  07/29/2015   CLINICAL DATA:  Shortness of breath starting last night  EXAM: PORTABLE  CHEST - 1 VIEW  COMPARISON:  None.  FINDINGS: Study is limited by poor inspiration. No acute infiltrate or pleural effusion. No pulmonary edema. Mild basilar atelectasis.  IMPRESSION: Limited study by poor inspiration. Mild basilar atelectasis. No pulmonary edema.   Electronically Signed   By: Lahoma Crocker M.D.   On: 07/29/2015 10:20     EKG Interpretation   Date/Time:  Thursday July 29 2015 09:46:24 EDT Ventricular Rate:  120 PR Interval:  159 QRS Duration: 89 QT Interval:  325 QTC Calculation: 459 R Axis:   -69 Text Interpretation:  Sinus tachycardia Atrial premature complex Left  anterior fascicular block Low voltage, precordial leads Abnormal R-wave  progression, early transition Consider anterior infarct Artifact Confirmed  by Wyvonnia Dusky  MD, Britney Captain ([id number]) on 07/29/2015 10:00:44 AM  MDM   Final diagnoses:  COPD exacerbation   Respiratory distress with difficulty breathing since this morning. 90% on room air for EMS. Patient not compliant with home O2. Discharge from hospital 3 days ago. He was given albuterol, Solu-Medrol and Combivent without relief.  Patient with respiratory distress on arrival. Decreased breath sounds with tight wheezing. Continuous nebulizer ordered, magnesium.  Chest x-ray negative for infiltrate. ABG shows no CO2 retention.  Creatinine 1.4 which is stable from patient's baseline per his otherChart (MRN: [id number])  He feels improved after treatments in the ED.  He is ambulatory maintaining saturations at 95% on home O2.  Air exchange improved with minimal wheezing.  He wishes to go home.  Discussed importance of wearing oxygen as prescribed. Steroid course will be extended. No additional antibiotics.  He has his inhalers. Return precautions discussed.  I personally performed the services described in this documentation, which was scribed in my presence. The recorded information has been reviewed and is accurate.   Matthew Essex, MD 07/29/15  1705

## 2015-07-29 NOTE — Discharge Instructions (Signed)
Chronic Obstructive Pulmonary Disease Use your inhalers as prescribed. Followup with your doctor. Return to the ED if you develop new or worsening symptoms. Chronic obstructive pulmonary disease (COPD) is a common lung condition in which airflow from the lungs is limited. COPD is a general term that can be used to describe many different lung problems that limit airflow, including both chronic bronchitis and emphysema. If you have COPD, your lung function will probably never return to normal, but there are measures you can take to improve lung function and make yourself feel better.  CAUSES   Smoking (common).   Exposure to secondhand smoke.   Genetic problems.  Chronic inflammatory lung diseases or recurrent infections. SYMPTOMS   Shortness of breath, especially with physical activity.   Deep, persistent (chronic) cough with a large amount of thick mucus.   Wheezing.   Rapid breaths (tachypnea).   Gray or bluish discoloration (cyanosis) of the skin, especially in fingers, toes, or lips.   Fatigue.   Weight loss.   Frequent infections or episodes when breathing symptoms become much worse (exacerbations).   Chest tightness. DIAGNOSIS  Your health care provider will take a medical history and perform a physical examination to make the initial diagnosis. Additional tests for COPD may include:   Lung (pulmonary) function tests.  Chest X-ray.  CT scan.  Blood tests. TREATMENT  Treatment available to help you feel better when you have COPD includes:   Inhaler and nebulizer medicines. These help manage the symptoms of COPD and make your breathing more comfortable.  Supplemental oxygen. Supplemental oxygen is only helpful if you have a low oxygen level in your blood.   Exercise and physical activity. These are beneficial for nearly all people with COPD. Some people may also benefit from a pulmonary rehabilitation program. HOME CARE INSTRUCTIONS   Take all  medicines (inhaled or pills) as directed by your health care provider.  Avoid over-the-counter medicines or cough syrups that dry up your airway (such as antihistamines) and slow down the elimination of secretions unless instructed otherwise by your health care provider.   If you are a smoker, the most important thing that you can do is stop smoking. Continuing to smoke will cause further lung damage and breathing trouble. Ask your health care provider for help with quitting smoking. He or she can direct you to community resources or hospitals that provide support.  Avoid exposure to irritants such as smoke, chemicals, and fumes that aggravate your breathing.  Use oxygen therapy and pulmonary rehabilitation if directed by your health care provider. If you require home oxygen therapy, ask your health care provider whether you should purchase a pulse oximeter to measure your oxygen level at home.   Avoid contact with individuals who have a contagious illness.  Avoid extreme temperature and humidity changes.  Eat healthy foods. Eating smaller, more frequent meals and resting before meals may help you maintain your strength.  Stay active, but balance activity with periods of rest. Exercise and physical activity will help you maintain your ability to do things you want to do.  Preventing infection and hospitalization is very important when you have COPD. Make sure to receive all the vaccines your health care provider recommends, especially the pneumococcal and influenza vaccines. Ask your health care provider whether you need a pneumonia vaccine.  Learn and use relaxation techniques to manage stress.  Learn and use controlled breathing techniques as directed by your health care provider. Controlled breathing techniques include:   Pursed lip  breathing. Start by breathing in (inhaling) through your nose for 1 second. Then, purse your lips as if you were going to whistle and breathe out (exhale)  through the pursed lips for 2 seconds.   Diaphragmatic breathing. Start by putting one hand on your abdomen just above your waist. Inhale slowly through your nose. The hand on your abdomen should move out. Then purse your lips and exhale slowly. You should be able to feel the hand on your abdomen moving in as you exhale.   Learn and use controlled coughing to clear mucus from your lungs. Controlled coughing is a series of short, progressive coughs. The steps of controlled coughing are:  1. Lean your head slightly forward.  2. Breathe in deeply using diaphragmatic breathing.  3. Try to hold your breath for 3 seconds.  4. Keep your mouth slightly open while coughing twice.  5. Spit any mucus out into a tissue.  6. Rest and repeat the steps once or twice as needed. SEEK MEDICAL CARE IF:   You are coughing up more mucus than usual.   There is a change in the color or thickness of your mucus.   Your breathing is more labored than usual.   Your breathing is faster than usual.  SEEK IMMEDIATE MEDICAL CARE IF:   You have shortness of breath while you are resting.   You have shortness of breath that prevents you from:  Being able to talk.   Performing your usual physical activities.   You have chest pain lasting longer than 5 minutes.   Your skin color is more cyanotic than usual.  You measure low oxygen saturations for longer than 5 minutes with a pulse oximeter. MAKE SURE YOU:   Understand these instructions.  Will watch your condition.  Will get help right away if you are not doing well or get worse. Document Released: 09/13/2005 Document Revised: 04/20/2014 Document Reviewed: 07/31/2013 [facility] Patient Information 2015 [place], [place]. This information is not intended to replace advice given to you by your health care provider. Make sure you discuss any questions you have with your health care provider.

## 2015-07-29 NOTE — ED Notes (Signed)
MD at bedside. 

## 2015-07-29 NOTE — ED Notes (Signed)
Pt was recently admitted to the hospital after coming in for SOB. Upon EMS arrival, pt o2 stat 90% on RA. Per EMS pt is suppose to wear 2L at home but is not. Pt has had increased SOB and productive cough since being discharged from hospital. Pt given 1 albuterol, 125 solumedrol, and 1 duoneb in route. IV established.

## 2015-07-30 ENCOUNTER — Encounter (HOSPITAL_COMMUNITY): Payer: Self-pay

## 2015-08-03 ENCOUNTER — Ambulatory Visit: Payer: Commercial Managed Care - HMO | Admitting: Family Medicine

## 2015-08-03 ENCOUNTER — Ambulatory Visit (HOSPITAL_COMMUNITY): Payer: Self-pay | Admitting: Psychiatry

## 2015-08-16 ENCOUNTER — Other Ambulatory Visit (HOSPITAL_COMMUNITY): Payer: Self-pay | Admitting: Respiratory Therapy

## 2015-08-16 DIAGNOSIS — J441 Chronic obstructive pulmonary disease with (acute) exacerbation: Secondary | ICD-10-CM

## 2015-08-17 ENCOUNTER — Other Ambulatory Visit: Payer: Self-pay | Admitting: Family Medicine

## 2015-08-18 ENCOUNTER — Encounter (HOSPITAL_COMMUNITY): Payer: Self-pay | Admitting: *Deleted

## 2015-08-18 ENCOUNTER — Observation Stay (HOSPITAL_COMMUNITY)
Admission: EM | Admit: 2015-08-18 | Discharge: 2015-08-20 | Disposition: A | Discharge status: Home / Self Care | Payer: Commercial Managed Care - HMO | Attending: Internal Medicine | Admitting: Internal Medicine

## 2015-08-18 ENCOUNTER — Emergency Department (HOSPITAL_COMMUNITY): Payer: Commercial Managed Care - HMO

## 2015-08-18 DIAGNOSIS — J441 Chronic obstructive pulmonary disease with (acute) exacerbation: Secondary | ICD-10-CM | POA: Diagnosis not present

## 2015-08-18 DIAGNOSIS — R0602 Shortness of breath: Secondary | ICD-10-CM | POA: Diagnosis present

## 2015-08-18 DIAGNOSIS — E871 Hypo-osmolality and hyponatremia: Secondary | ICD-10-CM | POA: Diagnosis not present

## 2015-08-18 DIAGNOSIS — E119 Type 2 diabetes mellitus without complications: Secondary | ICD-10-CM | POA: Diagnosis not present

## 2015-08-18 DIAGNOSIS — Z7982 Long term (current) use of aspirin: Secondary | ICD-10-CM | POA: Diagnosis not present

## 2015-08-18 DIAGNOSIS — E785 Hyperlipidemia, unspecified: Secondary | ICD-10-CM | POA: Insufficient documentation

## 2015-08-18 DIAGNOSIS — Z87891 Personal history of nicotine dependence: Secondary | ICD-10-CM | POA: Insufficient documentation

## 2015-08-18 DIAGNOSIS — F329 Major depressive disorder, single episode, unspecified: Secondary | ICD-10-CM | POA: Diagnosis not present

## 2015-08-18 DIAGNOSIS — I10 Essential (primary) hypertension: Secondary | ICD-10-CM | POA: Diagnosis not present

## 2015-08-18 DIAGNOSIS — F209 Schizophrenia, unspecified: Secondary | ICD-10-CM | POA: Diagnosis not present

## 2015-08-18 DIAGNOSIS — Z79899 Other long term (current) drug therapy: Secondary | ICD-10-CM | POA: Insufficient documentation

## 2015-08-18 DIAGNOSIS — Z720 Tobacco use: Secondary | ICD-10-CM | POA: Diagnosis present

## 2015-08-18 DIAGNOSIS — N179 Acute kidney failure, unspecified: Secondary | ICD-10-CM | POA: Insufficient documentation

## 2015-08-18 DIAGNOSIS — M1990 Unspecified osteoarthritis, unspecified site: Secondary | ICD-10-CM | POA: Insufficient documentation

## 2015-08-18 DIAGNOSIS — J449 Chronic obstructive pulmonary disease, unspecified: Secondary | ICD-10-CM | POA: Diagnosis present

## 2015-08-18 DIAGNOSIS — E669 Obesity, unspecified: Secondary | ICD-10-CM | POA: Diagnosis not present

## 2015-08-18 DIAGNOSIS — E1169 Type 2 diabetes mellitus with other specified complication: Secondary | ICD-10-CM

## 2015-08-18 LAB — CBC WITH DIFFERENTIAL/PLATELET
Basophils Absolute: 0 10*3/uL (ref 0.0–0.1)
Basophils Relative: 0 % (ref 0–1)
Eosinophils Absolute: 1 10*3/uL — ABNORMAL HIGH (ref 0.0–0.7)
Eosinophils Relative: 13 % — ABNORMAL HIGH (ref 0–5)
HCT: 34.4 % — ABNORMAL LOW (ref 39.0–52.0)
Hemoglobin: 11.5 g/dL — ABNORMAL LOW (ref 13.0–17.0)
Lymphocytes Relative: 29 % (ref 12–46)
Lymphs Abs: 2.3 10*3/uL (ref 0.7–4.0)
MCH: 28.9 pg (ref 26.0–34.0)
MCHC: 33.4 g/dL (ref 30.0–36.0)
MCV: 86.4 fL (ref 78.0–100.0)
Monocytes Absolute: 0.6 10*3/uL (ref 0.1–1.0)
Monocytes Relative: 7 % (ref 3–12)
Neutro Abs: 3.9 10*3/uL (ref 1.7–7.7)
Neutrophils Relative %: 51 % (ref 43–77)
Platelets: 233 10*3/uL (ref 150–400)
RBC: 3.98 MIL/uL — ABNORMAL LOW (ref 4.22–5.81)
RDW: 14.5 % (ref 11.5–15.5)
WBC: 7.9 10*3/uL (ref 4.0–10.5)

## 2015-08-18 LAB — COMPREHENSIVE METABOLIC PANEL
ALT: 28 U/L (ref 17–63)
AST: 25 U/L (ref 15–41)
Albumin: 4 g/dL (ref 3.5–5.0)
Alkaline Phosphatase: 48 U/L (ref 38–126)
Anion gap: 10 (ref 5–15)
BUN: 12 mg/dL (ref 6–20)
CO2: 21 mmol/L — ABNORMAL LOW (ref 22–32)
Calcium: 8.9 mg/dL (ref 8.9–10.3)
Chloride: 93 mmol/L — ABNORMAL LOW (ref 101–111)
Creatinine, Ser: 1.59 mg/dL — ABNORMAL HIGH (ref 0.61–1.24)
GFR calc Af Amer: 51 mL/min — ABNORMAL LOW (ref >60)
GFR calc non Af Amer: 44 mL/min — ABNORMAL LOW (ref >60)
Glucose, Bld: 77 mg/dL (ref 65–99)
Potassium: 4 mmol/L (ref 3.5–5.1)
Sodium: 124 mmol/L — ABNORMAL LOW (ref 135–145)
Total Bilirubin: 0.6 mg/dL (ref 0.3–1.2)
Total Protein: 7.6 g/dL (ref 6.5–8.1)

## 2015-08-18 LAB — TROPONIN I: Troponin I: <0.03 ng/mL (ref <0.031)

## 2015-08-18 LAB — BRAIN NATRIURETIC PEPTIDE: B Natriuretic Peptide: 12 pg/mL (ref 0.0–100.0)

## 2015-08-18 LAB — CBG MONITORING, ED: Glucose-Capillary: 71 mg/dL (ref 65–99)

## 2015-08-18 LAB — GLUCOSE, CAPILLARY: Glucose-Capillary: 109 mg/dL — ABNORMAL HIGH (ref 65–99)

## 2015-08-18 MED ORDER — IPRATROPIUM-ALBUTEROL 0.5-2.5 (3) MG/3ML IN SOLN
3.0000 mL | Freq: Once | RESPIRATORY_TRACT | Status: AC
  Administered 2015-08-18: 3 mL via RESPIRATORY_TRACT
  Filled 2015-08-18: qty 3

## 2015-08-18 MED ORDER — METHYLPREDNISOLONE SODIUM SUCC 125 MG IJ SOLR
80.0000 mg | Freq: Four times a day (QID) | INTRAMUSCULAR | Status: DC
  Administered 2015-08-18: 80 mg via INTRAVENOUS
  Filled 2015-08-18 (×2): qty 2

## 2015-08-18 MED ORDER — PREDNISONE 20 MG PO TABS: ORAL_TABLET | ORAL | Status: DC

## 2015-08-18 MED ORDER — LEVOFLOXACIN 500 MG PO TABS: 500.0000 mg | ORAL_TABLET | Freq: Every day | ORAL | Status: DC

## 2015-08-18 MED ORDER — GLIPIZIDE ER 5 MG PO TB24
10.0000 mg | ORAL_TABLET | Freq: Every day | ORAL | Status: DC
  Administered 2015-08-19 – 2015-08-20 (×2): 10 mg via ORAL
  Filled 2015-08-18: qty 2
  Filled 2015-08-18: qty 1
  Filled 2015-08-18: qty 2
  Filled 2015-08-18: qty 1

## 2015-08-18 MED ORDER — TIOTROPIUM BROMIDE MONOHYDRATE 18 MCG IN CAPS
18.0000 ug | ORAL_CAPSULE | Freq: Every day | RESPIRATORY_TRACT | Status: DC
  Administered 2015-08-20: 18 ug via RESPIRATORY_TRACT
  Filled 2015-08-18: qty 5

## 2015-08-18 MED ORDER — NICOTINE 21 MG/24HR TD PT24
21.0000 mg | MEDICATED_PATCH | Freq: Every day | TRANSDERMAL | Status: DC
  Administered 2015-08-19 – 2015-08-20 (×2): 21 mg via TRANSDERMAL
  Filled 2015-08-18 (×2): qty 1

## 2015-08-18 MED ORDER — IPRATROPIUM-ALBUTEROL 0.5-2.5 (3) MG/3ML IN SOLN
3.0000 mL | RESPIRATORY_TRACT | Status: DC | PRN
  Administered 2015-08-18 – 2015-08-19 (×3): 3 mL via RESPIRATORY_TRACT
  Filled 2015-08-18 (×3): qty 3

## 2015-08-18 MED ORDER — TRIAMTERENE-HCTZ 37.5-25 MG PO TABS
1.0000 | ORAL_TABLET | Freq: Every day | ORAL | Status: DC
  Administered 2015-08-19 – 2015-08-20 (×2): 1 via ORAL
  Filled 2015-08-18 (×4): qty 1

## 2015-08-18 MED ORDER — AMLODIPINE BESYLATE 5 MG PO TABS
10.0000 mg | ORAL_TABLET | Freq: Every day | ORAL | Status: DC
  Administered 2015-08-19 – 2015-08-20 (×2): 10 mg via ORAL
  Filled 2015-08-18 (×2): qty 2

## 2015-08-18 MED ORDER — POLYETHYLENE GLYCOL 3350 17 GM/SCOOP PO POWD
17.0000 g | Freq: Every day | ORAL | Status: DC
  Filled 2015-08-18: qty 255

## 2015-08-18 MED ORDER — RISPERIDONE 1 MG PO TABS
4.0000 mg | ORAL_TABLET | Freq: Every day | ORAL | Status: DC
Start: 2015-08-18 — End: 2015-08-20
  Administered 2015-08-18 – 2015-08-19 (×2): 4 mg via ORAL
  Filled 2015-08-18: qty 2
  Filled 2015-08-18 (×2): qty 4
  Filled 2015-08-18: qty 2

## 2015-08-18 MED ORDER — RISPERIDONE 1 MG PO TABS
1.0000 mg | ORAL_TABLET | Freq: Every day | ORAL | Status: DC
  Administered 2015-08-18 – 2015-08-19 (×2): 1 mg via ORAL
  Filled 2015-08-18 (×4): qty 1

## 2015-08-18 MED ORDER — CETYLPYRIDINIUM CHLORIDE 0.05 % MT LIQD
7.0000 mL | Freq: Two times a day (BID) | OROMUCOSAL | Status: DC
  Administered 2015-08-18 – 2015-08-20 (×4): 7 mL via OROMUCOSAL

## 2015-08-18 MED ORDER — EZETIMIBE 10 MG PO TABS: 10.0000 mg | ORAL_TABLET | Freq: Every day | ORAL | Status: DC

## 2015-08-18 MED ORDER — PREDNISONE 50 MG PO TABS
60.0000 mg | ORAL_TABLET | Freq: Once | ORAL | Status: AC
  Administered 2015-08-18: 60 mg via ORAL
  Filled 2015-08-18 (×2): qty 1

## 2015-08-18 MED ORDER — EZETIMIBE 10 MG PO TABS
10.0000 mg | ORAL_TABLET | Freq: Every day | ORAL | Status: DC
  Administered 2015-08-19 – 2015-08-20 (×2): 10 mg via ORAL
  Filled 2015-08-18 (×4): qty 1

## 2015-08-18 MED ORDER — SODIUM CHLORIDE 0.9 % IV SOLN
INTRAVENOUS | Status: DC
  Administered 2015-08-18: 23:00:00 via INTRAVENOUS

## 2015-08-18 MED ORDER — BUSPIRONE HCL 5 MG PO TABS
15.0000 mg | ORAL_TABLET | Freq: Two times a day (BID) | ORAL | Status: DC
  Administered 2015-08-18 – 2015-08-20 (×4): 15 mg via ORAL
  Filled 2015-08-18: qty 3
  Filled 2015-08-18: qty 1
  Filled 2015-08-18: qty 3
  Filled 2015-08-18: qty 1
  Filled 2015-08-18: qty 3
  Filled 2015-08-18 (×2): qty 1
  Filled 2015-08-18: qty 3

## 2015-08-18 MED ORDER — PRAVASTATIN SODIUM 40 MG PO TABS
40.0000 mg | ORAL_TABLET | Freq: Every day | ORAL | Status: DC
  Administered 2015-08-19: 40 mg via ORAL
  Filled 2015-08-18 (×2): qty 1

## 2015-08-18 MED ORDER — IRBESARTAN 300 MG PO TABS
300.0000 mg | ORAL_TABLET | Freq: Every day | ORAL | Status: DC
  Administered 2015-08-19 – 2015-08-20 (×2): 300 mg via ORAL
  Filled 2015-08-18 (×4): qty 1

## 2015-08-18 MED ORDER — METFORMIN HCL 500 MG PO TABS
1000.0000 mg | ORAL_TABLET | Freq: Two times a day (BID) | ORAL | Status: DC
  Administered 2015-08-19 – 2015-08-20 (×3): 1000 mg via ORAL
  Filled 2015-08-18 (×3): qty 2

## 2015-08-18 MED ORDER — ASPIRIN EC 81 MG PO TBEC
81.0000 mg | DELAYED_RELEASE_TABLET | Freq: Every day | ORAL | Status: DC
  Administered 2015-08-19 – 2015-08-20 (×2): 81 mg via ORAL
  Filled 2015-08-18 (×2): qty 1

## 2015-08-18 MED ORDER — BUDESONIDE-FORMOTEROL FUMARATE 80-4.5 MCG/ACT IN AERO
2.0000 | INHALATION_SPRAY | Freq: Two times a day (BID) | RESPIRATORY_TRACT | Status: DC
  Administered 2015-08-18 – 2015-08-20 (×4): 2 via RESPIRATORY_TRACT
  Filled 2015-08-18: qty 6.9

## 2015-08-18 MED ORDER — ONDANSETRON HCL 4 MG PO TABS: 4.0000 mg | ORAL_TABLET | Freq: Four times a day (QID) | ORAL | Status: DC | PRN

## 2015-08-18 MED ORDER — ONDANSETRON HCL 4 MG/2ML IJ SOLN: 4.0000 mg | Freq: Four times a day (QID) | INTRAMUSCULAR | Status: DC | PRN

## 2015-08-18 MED ORDER — ALBUTEROL SULFATE HFA 108 (90 BASE) MCG/ACT IN AERS
2.0000 | INHALATION_SPRAY | Freq: Four times a day (QID) | RESPIRATORY_TRACT | Status: DC | PRN
  Filled 2015-08-18: qty 6.7

## 2015-08-18 MED ORDER — ENOXAPARIN SODIUM 40 MG/0.4ML ~~LOC~~ SOLN
40.0000 mg | SUBCUTANEOUS | Status: DC
  Administered 2015-08-19: 40 mg via SUBCUTANEOUS
  Filled 2015-08-18 (×2): qty 0.4

## 2015-08-18 MED ORDER — INSULIN ASPART 100 UNIT/ML ~~LOC~~ SOLN
0.0000 [IU] | Freq: Three times a day (TID) | SUBCUTANEOUS | Status: DC
Start: 2015-08-19 — End: 2015-08-20
  Administered 2015-08-19: 4 [IU] via SUBCUTANEOUS
  Administered 2015-08-19 (×2): 7 [IU] via SUBCUTANEOUS
  Administered 2015-08-20: 4 [IU] via SUBCUTANEOUS

## 2015-08-18 MED ORDER — BUDESONIDE-FORMOTEROL FUMARATE 80-4.5 MCG/ACT IN AERO
INHALATION_SPRAY | RESPIRATORY_TRACT | Status: AC
  Filled 2015-08-18: qty 6.9

## 2015-08-18 MED ORDER — ALBUTEROL SULFATE (2.5 MG/3ML) 0.083% IN NEBU
2.5000 mg | INHALATION_SOLUTION | Freq: Once | RESPIRATORY_TRACT | Status: AC
  Administered 2015-08-18: 2.5 mg via RESPIRATORY_TRACT
  Filled 2015-08-18: qty 3

## 2015-08-18 MED ORDER — INSULIN ASPART 100 UNIT/ML ~~LOC~~ SOLN
0.0000 [IU] | Freq: Every day | SUBCUTANEOUS | Status: DC
  Administered 2015-08-19: 3 [IU] via SUBCUTANEOUS

## 2015-08-18 NOTE — ED Provider Notes (Addendum)
CSN: [id number]     Arrival date & time 08/18/15  1355 History   First MD Initiated Contact with Patient 08/18/15 1406     Chief Complaint  Patient presents with  . Shortness of Breath     (Consider location/radiation/quality/duration/timing/severity/associated sxs/prior Treatment) Patient is a 64 y.o. male presenting with shortness of breath. The history is provided by the patient (The patient presents with persistent cough and some shortness of breath. He has been on Zithromax with no relief).  Shortness of Breath Severity:  Moderate Onset quality:  Gradual Timing:  Constant Progression:  Waxing and waning Chronicity:  New Context: activity   Associated symptoms: no abdominal pain, no chest pain, no cough, no headaches and no rash     Past Medical History  Diagnosis Date  . Nicotine addiction   . Schizophrenia   . Hyperlipidemia   . Obesity   . Hypertension   . Diabetes mellitus   . Arthritis   . Acute respiratory failure with hypoxia   . Acute kidney injury   . Multiple lung nodules on CT 04/02/2015  . COPD (chronic obstructive pulmonary disease)   . Diabetes mellitus without complication   . Depression    Past Surgical History  Procedure Laterality Date  . Colonoscopy    . Colonoscopy N/A 04/01/2013    Procedure: COLONOSCOPY;  Surgeon: Danie Binder, MD;  Location: [facility];  Service: Endoscopy;  Laterality: N/A;  10:00 AM-moved to [place] notified pt  . Cataract extraction w/phaco Left 11/20/2013    Procedure: CATARACT EXTRACTION PHACO AND INTRAOCULAR LENS PLACEMENT (IOC);  Surgeon: Tonny Branch, MD;  Location: [facility];  Service: Ophthalmology;  Laterality: Left;  CDE:10.26  . Cataract extraction w/phaco Right 12/08/2013    Procedure: RIGHT EYE CATARACT EXTRACTION PHACO AND INTRAOCULAR LENS PLACEMENT ;  Surgeon: Tonny Branch, MD;  Location: [facility];  Service: Ophthalmology;  Laterality: Right;  CDE 12.38  . Eye surgery Left 11/2013    cataract extraction  .  Shoulder surgery     Family History  Problem Relation Age of Onset  . Diabetes Mother   . Hypertension Mother   . Stroke Mother   . Diabetes Sister   . Heart disease Sister   . Kidney disease Father    Social History  Substance Use Topics  . Smoking status: Former Research scientist (life sciences)  . Smokeless tobacco: None  . Alcohol Use: No    Review of Systems  Constitutional: Negative for appetite change and fatigue.  HENT: Negative for congestion, ear discharge and sinus pressure.   Eyes: Negative for discharge.  Respiratory: Positive for shortness of breath. Negative for cough.   Cardiovascular: Negative for chest pain.  Gastrointestinal: Negative for abdominal pain and diarrhea.  Genitourinary: Negative for frequency and hematuria.  Musculoskeletal: Negative for back pain.  Skin: Negative for rash.  Neurological: Negative for seizures and headaches.  Psychiatric/Behavioral: Negative for hallucinations.      Allergies  Sertraline hcl and Wellbutrin  Home Medications   Prior to Admission medications   Medication Sig Start Date End Date Taking? Authorizing Provider  ACCU-CHEK AVIVA PLUS test strip TEST BLOOD SUGAR ONCE DAILY AS DIRECTED. 12/14/14  Yes Fayrene Helper, MD  albuterol (PROVENTIL HFA;VENTOLIN HFA) 108 (90 BASE) MCG/ACT inhaler Inhale 2 puffs into the lungs every 6 (six) hours as needed for wheezing or shortness of breath. 07/29/15  Yes Ezequiel Essex, MD  amLODipine (NORVASC) 10 MG tablet TAKE (1) TABLET BY MOUTH DAILY. 07/26/15  Yes  Lezlie Octave Black, NP  amLODipine (NORVASC) 10 MG tablet TAKE (1) TABLET BY MOUTH DAILY. 08/18/15  Yes Fayrene Helper, MD  aspirin EC 81 MG tablet Take 81 mg by mouth daily.   Yes Historical Provider, MD  azithromycin (ZITHROMAX) 500 MG tablet Take one tab daily starting 07/27/15. Patient taking differently: Take 500 mg by mouth daily. Take one tab daily starting 07/27/15. 07/26/15  Yes Lezlie Octave Black, NP  budesonide-formoterol (SYMBICORT) 80-4.5 MCG/ACT  inhaler INHALE 2 PUFFS INTO LUNGS TWICE DAILY. 07/26/15  Yes Lezlie Octave Black, NP  busPIRone (BUSPAR) 15 MG tablet Take 1 tablet (15 mg total) by mouth 2 (two) times daily. 07/26/15  Yes Lezlie Octave Black, NP  ezetimibe (ZETIA) 10 MG tablet Take 1 tablet (10 mg total) by mouth daily. 07/26/15  Yes Lezlie Octave Black, NP  glipiZIDE (GLUCOTROL XL) 10 MG 24 hr tablet Take 1 tablet (10 mg total) by mouth daily. 07/26/15  Yes Lezlie Octave Black, NP  ipratropium-albuterol (DUONEB) 0.5-2.5 (3) MG/3ML SOLN Take 3 mLs by nebulization every 4 (four) hours as needed. Patient taking differently: Take 3 mLs by nebulization every 4 (four) hours as needed (shortness of breath).  01/20/15  Yes Lezlie Octave Black, NP  metFORMIN (GLUCOPHAGE) 1000 MG tablet TAKE 1 TABLET BY MOUTH TWICE DAILY WITH FOOD FOR DIABETES. 07/26/15  Yes Lezlie Octave Black, NP  nicotine (NICODERM CQ - DOSED IN MG/24 HOURS) 21 mg/24hr patch Place 1 patch (21 mg total) onto the skin daily. 07/26/15  Yes Lezlie Octave Black, NP  polyethylene glycol powder (GLYCOLAX/MIRALAX) powder Take 17 g by mouth daily. 11/30/14  Yes Fayrene Helper, MD  pravastatin (PRAVACHOL) 40 MG tablet TAKE (1) TABLET BY MOUTH AT BEDTIME FOR CHOLESTEROL. 07/26/15  Yes Lezlie Octave Black, NP  risperiDONE (RISPERDAL) 1 MG tablet Take 1 tablet (1 mg total) by mouth at bedtime. 07/26/15  Yes Lezlie Octave Black, NP  risperidone (RISPERDAL) 4 MG tablet Take 1 tablet (4 mg total) by mouth at bedtime. 07/26/15  Yes Radene Gunning, NP  SPIRIVA HANDIHALER 18 MCG inhalation capsule INHALE 1 CAPSULE AS DIRECTED ONCE A DAY. 02/22/15  Yes Fayrene Helper, MD  triamterene-hydrochlorothiazide (MAXZIDE-25) 37.5-25 MG per tablet TAKE (1) TABLET BY MOUTH ONCE DAILY. 07/26/15  Yes Lezlie Octave Black, NP  valsartan (DIOVAN) 320 MG tablet Take 1 tablet (320 mg total) by mouth daily. 07/26/15  Yes Lezlie Octave Black, NP  ZETIA 10 MG tablet TAKE 1 TABLET BY MOUTH ONCE DAILY. 08/18/15  Yes Fayrene Helper, MD  levofloxacin (LEVAQUIN) 500 MG tablet Take 1 tablet (500  mg total) by mouth daily. 08/18/15   Milton Ferguson, MD  predniSONE (DELTASONE) 20 MG tablet 2 tabs po daily x 3 days 08/18/15   Milton Ferguson, MD   BP 122/70 mmHg  Pulse 95  Temp(Src) 97.8 F (36.6 C) (Oral)  Resp 27  SpO2 92% Physical Exam  Constitutional: He is oriented to person, place, and time. He appears well-developed.  HENT:  Head: Normocephalic.  Eyes: Conjunctivae and EOM are normal. No scleral icterus.  Neck: Neck supple. No thyromegaly present.  Cardiovascular: Normal rate and regular rhythm.  Exam reveals no gallop and no friction rub.   No murmur heard. Pulmonary/Chest: No stridor. He has wheezes. He has no rales. He exhibits no tenderness.  Abdominal: He exhibits no distension. There is no tenderness. There is no rebound.  Musculoskeletal: Normal range of motion. He exhibits no edema.  Lymphadenopathy:    He has  no cervical adenopathy.  Neurological: He is oriented to person, place, and time. He exhibits normal muscle tone. Coordination normal.  Skin: No rash noted. No erythema.  Psychiatric: He has a normal mood and affect. His behavior is normal.    ED Course  Procedures (including critical care time) Labs Review Labs Reviewed  CBC WITH DIFFERENTIAL/PLATELET - Abnormal; Notable for the following:    RBC 3.98 (*)    Hemoglobin 11.5 (*)    HCT 34.4 (*)    Eosinophils Relative 13 (*)    Eosinophils Absolute 1.0 (*)    All other components within normal limits  COMPREHENSIVE METABOLIC PANEL - Abnormal; Notable for the following:    Sodium 124 (*)    Chloride 93 (*)    CO2 21 (*)    Creatinine, Ser 1.59 (*)    GFR calc non Af Amer 44 (*)    GFR calc Af Amer 51 (*)    All other components within normal limits  BRAIN NATRIURETIC PEPTIDE  TROPONIN I    Imaging Review Dg Chest Portable 1 View  08/18/2015   CLINICAL DATA:  Shortness of breath and cough, progressing  EXAM: PORTABLE CHEST - 1 VIEW  COMPARISON:  July 24, 2015  FINDINGS: There is no edema or  consolidation. The heart is upper normal in size with pulmonary vascularity within normal limits. No adenopathy. No pneumothorax. No bone lesions.  IMPRESSION: No edema or consolidation.   Electronically Signed   By: Lowella Grip III M.D.   On: 08/18/2015 14:46   I have personally reviewed and evaluated these images and lab results as part of my medical decision-making.   EKG Interpretation   Date/Time:  Wednesday August 18 2015 14:17:37 EDT Ventricular Rate:  106 PR Interval:  187 QRS Duration: 83 QT Interval:  323 QTC Calculation: 429 R Axis:   -59 Text Interpretation:  Sinus tachycardia Left anterior fascicular block Low  voltage, precordial leads RSR' in V1 or V2, right VCD or RVH Confirmed by  Ranvir Renovato  MD, Broadus John [phone]) on 08/18/2015 5:24:35 PM      MDM   Final diagnoses:  COPD exacerbation  copd,  Hyponatremia,  Admit to hospitalist     Milton Ferguson, MD 08/18/15 1726  Milton Ferguson, MD 08/18/15 [phone]

## 2015-08-18 NOTE — Discharge Instructions (Signed)
Follow up with your md in 2 days for recheck of salt level and breathing.  Try to decrease your fluid intake by 1/3rd.

## 2015-08-18 NOTE — ED Notes (Signed)
Pt comes in with cough and shortness of breath that started this morning with worsening on ride to the hospital. Pt has labored breathing on arrival.

## 2015-08-18 NOTE — H&P (Addendum)
[facility] History and Physical  SARP VERNIER FIE:[id number] DOB: 1951/01/18 DOA: 08/18/2015  Referring physician: ER PCP: [name], MD   Chief Complaint: Dyspnea  HPI: Matthew Molina is a 64 y.o. male  This is a 64 year old man who was recently admitted and discharged from the hospital with COPD exacerbation. He presents once again with 12 hour history of dyspnea associated with productive cough of white sputum. There is no fever. He apparently has been on Zithromax with no relief. He continues to smoke one pack of cigarette per day. He is diabetic and hypertensive. He is now being admitted because his bronchospasm has not improved in the emergency room.   Review of Systems:  Apart from symptoms above, all systems negative.  Past Medical History  Diagnosis Date  . Nicotine addiction   . Schizophrenia   . Hyperlipidemia   . Obesity   . Hypertension   . Diabetes mellitus   . Arthritis   . Acute respiratory failure with hypoxia   . Acute kidney injury   . Multiple lung nodules on CT 04/02/2015  . COPD (chronic obstructive pulmonary disease)   . Diabetes mellitus without complication   . Depression    Past Surgical History  Procedure Laterality Date  . Colonoscopy    . Colonoscopy N/A 04/01/2013    Procedure: COLONOSCOPY;  Surgeon: Danie Binder, MD;  Location: [facility];  Service: Endoscopy;  Laterality: N/A;  10:00 AM-moved to [place] notified pt  . Cataract extraction w/phaco Left 11/20/2013    Procedure: CATARACT EXTRACTION PHACO AND INTRAOCULAR LENS PLACEMENT (IOC);  Surgeon: Tonny Branch, MD;  Location: [facility];  Service: Ophthalmology;  Laterality: Left;  CDE:10.26  . Cataract extraction w/phaco Right 12/08/2013    Procedure: RIGHT EYE CATARACT EXTRACTION PHACO AND INTRAOCULAR LENS PLACEMENT ;  Surgeon: Tonny Branch, MD;  Location: [facility];  Service: Ophthalmology;  Laterality: Right;  CDE 12.38  . Eye surgery Left 11/2013    cataract extraction  .  Shoulder surgery     Social History:  reports that he has quit smoking. He does not have any smokeless tobacco history on file. He reports that he does not drink alcohol or use illicit drugs.  Allergies  Allergen Reactions  . Sertraline Hcl     Stomach upset/pain  . Wellbutrin [Bupropion] Other (See Comments)    Makes stomach hurt    Family History  Problem Relation Age of Onset  . Diabetes Mother   . Hypertension Mother   . Stroke Mother   . Diabetes Sister   . Heart disease Sister   . Kidney disease Father     Prior to Admission medications   Medication Sig Start Date End Date Taking? Authorizing Provider  ACCU-CHEK AVIVA PLUS test strip TEST BLOOD SUGAR ONCE DAILY AS DIRECTED. 12/14/14  Yes Fayrene Helper, MD  albuterol (PROVENTIL HFA;VENTOLIN HFA) 108 (90 BASE) MCG/ACT inhaler Inhale 2 puffs into the lungs every 6 (six) hours as needed for wheezing or shortness of breath. 07/29/15  Yes Ezequiel Essex, MD  amLODipine (NORVASC) 10 MG tablet TAKE (1) TABLET BY MOUTH DAILY. 07/26/15  Yes Lezlie Octave Black, NP  amLODipine (NORVASC) 10 MG tablet TAKE (1) TABLET BY MOUTH DAILY. 08/18/15  Yes Fayrene Helper, MD  aspirin EC 81 MG tablet Take 81 mg by mouth daily.   Yes Historical Provider, MD  azithromycin (ZITHROMAX) 500 MG tablet Take one tab daily starting 07/27/15. Patient taking differently: Take 500 mg by  mouth daily. Take one tab daily starting 07/27/15. 07/26/15  Yes Lezlie Octave Black, NP  budesonide-formoterol (SYMBICORT) 80-4.5 MCG/ACT inhaler INHALE 2 PUFFS INTO LUNGS TWICE DAILY. 07/26/15  Yes Lezlie Octave Black, NP  busPIRone (BUSPAR) 15 MG tablet Take 1 tablet (15 mg total) by mouth 2 (two) times daily. 07/26/15  Yes Lezlie Octave Black, NP  ezetimibe (ZETIA) 10 MG tablet Take 1 tablet (10 mg total) by mouth daily. 07/26/15  Yes Lezlie Octave Black, NP  glipiZIDE (GLUCOTROL XL) 10 MG 24 hr tablet Take 1 tablet (10 mg total) by mouth daily. 07/26/15  Yes Lezlie Octave Black, NP  ipratropium-albuterol (DUONEB)  0.5-2.5 (3) MG/3ML SOLN Take 3 mLs by nebulization every 4 (four) hours as needed. Patient taking differently: Take 3 mLs by nebulization every 4 (four) hours as needed (shortness of breath).  01/20/15  Yes Lezlie Octave Black, NP  metFORMIN (GLUCOPHAGE) 1000 MG tablet TAKE 1 TABLET BY MOUTH TWICE DAILY WITH FOOD FOR DIABETES. 07/26/15  Yes Lezlie Octave Black, NP  nicotine (NICODERM CQ - DOSED IN MG/24 HOURS) 21 mg/24hr patch Place 1 patch (21 mg total) onto the skin daily. 07/26/15  Yes Lezlie Octave Black, NP  polyethylene glycol powder (GLYCOLAX/MIRALAX) powder Take 17 g by mouth daily. 11/30/14  Yes Fayrene Helper, MD  pravastatin (PRAVACHOL) 40 MG tablet TAKE (1) TABLET BY MOUTH AT BEDTIME FOR CHOLESTEROL. 07/26/15  Yes Lezlie Octave Black, NP  risperiDONE (RISPERDAL) 1 MG tablet Take 1 tablet (1 mg total) by mouth at bedtime. 07/26/15  Yes Lezlie Octave Black, NP  risperidone (RISPERDAL) 4 MG tablet Take 1 tablet (4 mg total) by mouth at bedtime. 07/26/15  Yes Radene Gunning, NP  SPIRIVA HANDIHALER 18 MCG inhalation capsule INHALE 1 CAPSULE AS DIRECTED ONCE A DAY. 02/22/15  Yes Fayrene Helper, MD  triamterene-hydrochlorothiazide (MAXZIDE-25) 37.5-25 MG per tablet TAKE (1) TABLET BY MOUTH ONCE DAILY. 07/26/15  Yes Lezlie Octave Black, NP  valsartan (DIOVAN) 320 MG tablet Take 1 tablet (320 mg total) by mouth daily. 07/26/15  Yes Lezlie Octave Black, NP  ZETIA 10 MG tablet TAKE 1 TABLET BY MOUTH ONCE DAILY. 08/18/15  Yes Fayrene Helper, MD  levofloxacin (LEVAQUIN) 500 MG tablet Take 1 tablet (500 mg total) by mouth daily. 08/18/15   Milton Ferguson, MD  predniSONE (DELTASONE) 20 MG tablet 2 tabs po daily x 3 days 08/18/15   Milton Ferguson, MD   Physical Exam: Filed Vitals:   08/18/15 1600 08/18/15 1630 08/18/15 1700 08/18/15 1734  BP: 110/71 115/75 122/70   Pulse: 96  95   Temp:      TempSrc:      Resp: 31 32 27   SpO2: 100%  92% 92%    Wt Readings from Last 3 Encounters:  07/29/15 113.399 kg (250 lb)  07/26/15 114.034 kg (251 lb 6.4 oz)   06/03/15 115.214 kg (254 lb)    General:  Appears calm and comfortable. He does not appear to have increased work of breathing. There is no peripheral or central cyanosis. Eyes: PERRL, normal lids, irises & conjunctiva ENT: grossly normal hearing, lips & tongue Neck: no LAD, masses or thyromegaly Cardiovascular: RRR, no m/r/g. No LE edema. Telemetry: SR, no arrhythmias  Respiratory: Bilateral wheezing, moderately tight. No crackles or bronchial breathing. Abdomen: soft, ntnd Skin: no rash or induration seen on limited exam Musculoskeletal: grossly normal tone BUE/BLE Psychiatric: grossly normal mood and affect, speech fluent and appropriate Neurologic: grossly non-focal.  Labs on Admission:  Basic Metabolic Panel:  Recent Labs Lab 08/18/15 1430  NA 124*  K 4.0  CL 93*  CO2 21*  GLUCOSE 77  BUN 12  CREATININE 1.59*  CALCIUM 8.9   Liver Function Tests:  Recent Labs Lab 08/18/15 1430  AST 25  ALT 28  ALKPHOS 48  BILITOT 0.6  PROT 7.6  ALBUMIN 4.0   No results for input(s): LIPASE, AMYLASE in the last 168 hours. No results for input(s): AMMONIA in the last 168 hours. CBC:  Recent Labs Lab 08/18/15 1430  WBC 7.9  NEUTROABS 3.9  HGB 11.5*  HCT 34.4*  MCV 86.4  PLT 233   Cardiac Enzymes:  Recent Labs Lab 08/18/15 1430  TROPONINI <0.03    BNP (last 3 results)  Recent Labs  01/17/15 1705 07/29/15 0955 08/18/15 1430  BNP 16.0 21.0 12.0    ProBNP (last 3 results) No results for input(s): PROBNP in the last 8760 hours.  CBG:  Recent Labs Lab 08/18/15 1839  GLUCAP 71    Radiological Exams on Admission: Dg Chest Portable 1 View  08/18/2015   CLINICAL DATA:  Shortness of breath and cough, progressing  EXAM: PORTABLE CHEST - 1 VIEW  COMPARISON:  July 24, 2015  FINDINGS: There is no edema or consolidation. The heart is upper normal in size with pulmonary vascularity within normal limits. No adenopathy. No pneumothorax. No bone  lesions.  IMPRESSION: No edema or consolidation.   Electronically Signed   By: Lowella Grip III M.D.   On: 08/18/2015 14:46    EKG: Independently reviewed. Sinus rhythm without any acute ST-T wave changes.  Assessment/Plan   1. COPD exacerbation. He will be treated with IV steroids and broncho-dilators. I do not think at the present time he has a infective component so I will not prescribe antibiotics for the time being. 2. Diabetes. Continue with home medications and sliding scale insulin. 3. Hypertension. Stable. Continue with home medications. 4. Tobacco abuse. I strongly counseled him to quit smoking. 5. Hyponatremia. This may be reflective of degree of hypovolemia. Gentle IV fluids. Monitor renal function closely.  Further recommendations will depend on patient's hospital progress.   Code Status: Full code.  DVT Prophylaxis: Lovenox.  Family Communication: I discussed the plan with the patient at the bedside.   Disposition Plan: Home when medically stable.   Time spent: 45 minutes.  Doree Albee [facility] Pager [phone].

## 2015-08-18 NOTE — Progress Notes (Signed)
Called for report on patient; secretary informed us that RN was unavailable and told us to call back after change of shift. Will report to oncoming RN.

## 2015-08-18 NOTE — Progress Notes (Signed)
Patient c/o cough, paged on-call MD, will follow any new orders received and continue to monitor the patient.

## 2015-08-18 NOTE — ED Notes (Signed)
Respiratory called for nebulizer treatment.

## 2015-08-19 DIAGNOSIS — J441 Chronic obstructive pulmonary disease with (acute) exacerbation: Secondary | ICD-10-CM | POA: Diagnosis not present

## 2015-08-19 DIAGNOSIS — I10 Essential (primary) hypertension: Secondary | ICD-10-CM | POA: Diagnosis not present

## 2015-08-19 DIAGNOSIS — E669 Obesity, unspecified: Secondary | ICD-10-CM | POA: Diagnosis not present

## 2015-08-19 DIAGNOSIS — E871 Hypo-osmolality and hyponatremia: Secondary | ICD-10-CM | POA: Diagnosis not present

## 2015-08-19 DIAGNOSIS — E119 Type 2 diabetes mellitus without complications: Secondary | ICD-10-CM | POA: Diagnosis not present

## 2015-08-19 DIAGNOSIS — J42 Unspecified chronic bronchitis: Secondary | ICD-10-CM | POA: Diagnosis not present

## 2015-08-19 LAB — GLUCOSE, CAPILLARY
Glucose-Capillary: 214 mg/dL — ABNORMAL HIGH (ref 65–99)
Glucose-Capillary: 159 mg/dL — ABNORMAL HIGH (ref 65–99)
Glucose-Capillary: 207 mg/dL — ABNORMAL HIGH (ref 65–99)
Glucose-Capillary: 295 mg/dL — ABNORMAL HIGH (ref 65–99)

## 2015-08-19 LAB — COMPREHENSIVE METABOLIC PANEL
ALT: 30 U/L (ref 17–63)
AST: 30 U/L (ref 15–41)
Albumin: 4.1 g/dL (ref 3.5–5.0)
Alkaline Phosphatase: 45 U/L (ref 38–126)
Anion gap: 14 (ref 5–15)
BUN: 16 mg/dL (ref 6–20)
CO2: 21 mmol/L — ABNORMAL LOW (ref 22–32)
Calcium: 9.9 mg/dL (ref 8.9–10.3)
Chloride: 92 mmol/L — ABNORMAL LOW (ref 101–111)
Creatinine, Ser: 1.43 mg/dL — ABNORMAL HIGH (ref 0.61–1.24)
GFR calc Af Amer: 58 mL/min — ABNORMAL LOW (ref >60)
GFR calc non Af Amer: 50 mL/min — ABNORMAL LOW (ref >60)
Glucose, Bld: 228 mg/dL — ABNORMAL HIGH (ref 65–99)
Potassium: 4.5 mmol/L (ref 3.5–5.1)
Sodium: 127 mmol/L — ABNORMAL LOW (ref 135–145)
Total Bilirubin: 0.5 mg/dL (ref 0.3–1.2)
Total Protein: 7.8 g/dL (ref 6.5–8.1)

## 2015-08-19 LAB — CBC
HCT: 35.8 % — ABNORMAL LOW (ref 39.0–52.0)
Hemoglobin: 12.1 g/dL — ABNORMAL LOW (ref 13.0–17.0)
MCH: 29 pg (ref 26.0–34.0)
MCHC: 33.8 g/dL (ref 30.0–36.0)
MCV: 85.9 fL (ref 78.0–100.0)
Platelets: 240 10*3/uL (ref 150–400)
RBC: 4.17 MIL/uL — ABNORMAL LOW (ref 4.22–5.81)
RDW: 14.2 % (ref 11.5–15.5)
WBC: 8.1 10*3/uL (ref 4.0–10.5)

## 2015-08-19 MED ORDER — LEVOFLOXACIN 500 MG PO TABS
500.0000 mg | ORAL_TABLET | Freq: Every day | ORAL | Status: DC
  Administered 2015-08-19 – 2015-08-20 (×2): 500 mg via ORAL
  Filled 2015-08-19 (×2): qty 1

## 2015-08-19 MED ORDER — IPRATROPIUM-ALBUTEROL 0.5-2.5 (3) MG/3ML IN SOLN
3.0000 mL | RESPIRATORY_TRACT | Status: DC
  Administered 2015-08-19 (×5): 3 mL via RESPIRATORY_TRACT
  Filled 2015-08-19 (×5): qty 3

## 2015-08-19 MED ORDER — PREDNISONE 20 MG PO TABS
60.0000 mg | ORAL_TABLET | Freq: Every day | ORAL | Status: DC
  Administered 2015-08-19 – 2015-08-20 (×2): 60 mg via ORAL
  Filled 2015-08-19 (×2): qty 3

## 2015-08-19 MED ORDER — POLYETHYLENE GLYCOL 3350 17 G PO PACK
17.0000 g | PACK | Freq: Every day | ORAL | Status: DC
  Administered 2015-08-19 – 2015-08-20 (×2): 17 g via ORAL
  Filled 2015-08-19 (×2): qty 1

## 2015-08-19 MED ORDER — HYDROCOD POLST-CPM POLST ER 10-8 MG/5ML PO SUER
5.0000 mL | Freq: Two times a day (BID) | ORAL | Status: DC | PRN
  Administered 2015-08-19 (×2): 5 mL via ORAL
  Filled 2015-08-19 (×2): qty 5

## 2015-08-19 NOTE — Progress Notes (Signed)
Patient has been stuck multiple times and night nurses were unable to get IV access. Patient refuses to be stuck again until seen by physician. Dr. Marin Comment aware.

## 2015-08-19 NOTE — Progress Notes (Signed)
[facility] PROGRESS NOTE  Matthew Molina GUR:[id number] DOB: 1951/06/12    PCP:   [name], MD   HPI: Matthew Molina is an 64 y.o. male with hx of severe COPD, active tobacco abuse, HTN, DM, schizophrenia, admitted for COPD exacerbation.   He was given IV Steroids, nebs, and no antibiotics.  He is a hard stick and had trouble with IV access. He is feeling better today and wanting to go home.   Rewiew of Systems:  Constitutional: Negative for malaise, fever and chills. No significant weight loss or weight gain Eyes: Negative for eye pain, redness and discharge, diplopia, visual changes, or flashes of light. ENMT: Negative for ear pain, hoarseness, nasal congestion, sinus pressure and sore throat. No headaches; tinnitus, drooling, or problem swallowing. Cardiovascular: Negative for chest pain, palpitations, diaphoresis,and peripheral edema. ; No orthopnea, PND Respiratory: Negative for cough, hemoptysis, wheezing and stridor. No pleuritic chestpain. Gastrointestinal: Negative for nausea, vomiting, diarrhea, constipation, abdominal pain, melena, blood in stool, hematemesis, jaundice and rectal bleeding.    Genitourinary: Negative for frequency, dysuria, incontinence,flank pain and hematuria; Musculoskeletal: Negative for back pain and neck pain. Negative for swelling and trauma.;  Skin: . Negative for pruritus, rash, abrasions, bruising and skin lesion.; ulcerations Neuro: Negative for headache, lightheadedness and neck stiffness. Negative for weakness, altered level of consciousness , altered mental status, extremity weakness, burning feet, involuntary movement, seizure and syncope.  Psych: negative for anxiety, depression, insomnia, tearfulness, panic attacks, hallucinations, paranoia, suicidal or homicidal ideation    Past Medical History  Diagnosis Date  . Nicotine addiction   . Schizophrenia   . Hyperlipidemia   . Obesity   . Hypertension   . Diabetes mellitus   .  Arthritis   . Acute respiratory failure with hypoxia   . Acute kidney injury   . Multiple lung nodules on CT 04/02/2015  . COPD (chronic obstructive pulmonary disease)   . Diabetes mellitus without complication   . Depression     Past Surgical History  Procedure Laterality Date  . Colonoscopy    . Colonoscopy N/A 04/01/2013    Procedure: COLONOSCOPY;  Surgeon: Danie Binder, MD;  Location: [facility];  Service: Endoscopy;  Laterality: N/A;  10:00 AM-moved to [place] notified pt  . Cataract extraction w/phaco Left 11/20/2013    Procedure: CATARACT EXTRACTION PHACO AND INTRAOCULAR LENS PLACEMENT (IOC);  Surgeon: Tonny Branch, MD;  Location: [facility];  Service: Ophthalmology;  Laterality: Left;  CDE:10.26  . Cataract extraction w/phaco Right 12/08/2013    Procedure: RIGHT EYE CATARACT EXTRACTION PHACO AND INTRAOCULAR LENS PLACEMENT ;  Surgeon: Tonny Branch, MD;  Location: [facility];  Service: Ophthalmology;  Laterality: Right;  CDE 12.38  . Eye surgery Left 11/2013    cataract extraction  . Shoulder surgery      Medications:  HOME MEDS: Prior to Admission medications   Medication Sig Start Date End Date Taking? Authorizing Provider  ACCU-CHEK AVIVA PLUS test strip TEST BLOOD SUGAR ONCE DAILY AS DIRECTED. 12/14/14  Yes Fayrene Helper, MD  albuterol (PROVENTIL HFA;VENTOLIN HFA) 108 (90 BASE) MCG/ACT inhaler Inhale 2 puffs into the lungs every 6 (six) hours as needed for wheezing or shortness of breath. 07/29/15  Yes Ezequiel Essex, MD  amLODipine (NORVASC) 10 MG tablet TAKE (1) TABLET BY MOUTH DAILY. 07/26/15  Yes Lezlie Octave Black, NP  amLODipine (NORVASC) 10 MG tablet TAKE (1) TABLET BY MOUTH DAILY. 08/18/15  Yes Fayrene Helper, MD  aspirin EC 81  MG tablet Take 81 mg by mouth daily.   Yes Historical Provider, MD  azithromycin (ZITHROMAX) 500 MG tablet Take one tab daily starting 07/27/15. Patient taking differently: Take 500 mg by mouth daily. Take one tab daily starting 07/27/15. 07/26/15   Yes Lezlie Octave Black, NP  budesonide-formoterol (SYMBICORT) 80-4.5 MCG/ACT inhaler INHALE 2 PUFFS INTO LUNGS TWICE DAILY. 07/26/15  Yes Lezlie Octave Black, NP  busPIRone (BUSPAR) 15 MG tablet Take 1 tablet (15 mg total) by mouth 2 (two) times daily. 07/26/15  Yes Lezlie Octave Black, NP  ezetimibe (ZETIA) 10 MG tablet Take 1 tablet (10 mg total) by mouth daily. 07/26/15  Yes Lezlie Octave Black, NP  glipiZIDE (GLUCOTROL XL) 10 MG 24 hr tablet Take 1 tablet (10 mg total) by mouth daily. 07/26/15  Yes Lezlie Octave Black, NP  ipratropium-albuterol (DUONEB) 0.5-2.5 (3) MG/3ML SOLN Take 3 mLs by nebulization every 4 (four) hours as needed. Patient taking differently: Take 3 mLs by nebulization every 4 (four) hours as needed (shortness of breath).  01/20/15  Yes Lezlie Octave Black, NP  metFORMIN (GLUCOPHAGE) 1000 MG tablet TAKE 1 TABLET BY MOUTH TWICE DAILY WITH FOOD FOR DIABETES. 07/26/15  Yes Lezlie Octave Black, NP  nicotine (NICODERM CQ - DOSED IN MG/24 HOURS) 21 mg/24hr patch Place 1 patch (21 mg total) onto the skin daily. 07/26/15  Yes Lezlie Octave Black, NP  polyethylene glycol powder (GLYCOLAX/MIRALAX) powder Take 17 g by mouth daily. 11/30/14  Yes Fayrene Helper, MD  pravastatin (PRAVACHOL) 40 MG tablet TAKE (1) TABLET BY MOUTH AT BEDTIME FOR CHOLESTEROL. 07/26/15  Yes Lezlie Octave Black, NP  risperiDONE (RISPERDAL) 1 MG tablet Take 1 tablet (1 mg total) by mouth at bedtime. 07/26/15  Yes Lezlie Octave Black, NP  risperidone (RISPERDAL) 4 MG tablet Take 1 tablet (4 mg total) by mouth at bedtime. 07/26/15  Yes Radene Gunning, NP  SPIRIVA HANDIHALER 18 MCG inhalation capsule INHALE 1 CAPSULE AS DIRECTED ONCE A DAY. 02/22/15  Yes Fayrene Helper, MD  triamterene-hydrochlorothiazide (MAXZIDE-25) 37.5-25 MG per tablet TAKE (1) TABLET BY MOUTH ONCE DAILY. 07/26/15  Yes Lezlie Octave Black, NP  valsartan (DIOVAN) 320 MG tablet Take 1 tablet (320 mg total) by mouth daily. 07/26/15  Yes Lezlie Octave Black, NP  ZETIA 10 MG tablet TAKE 1 TABLET BY MOUTH ONCE DAILY. 08/18/15  Yes Fayrene Helper, MD  levofloxacin (LEVAQUIN) 500 MG tablet Take 1 tablet (500 mg total) by mouth daily. 08/18/15   Milton Ferguson, MD  predniSONE (DELTASONE) 20 MG tablet 2 tabs po daily x 3 days 08/18/15   Milton Ferguson, MD     Allergies:  Allergies  Allergen Reactions  . Sertraline Hcl     Stomach upset/pain  . Wellbutrin [Bupropion] Other (See Comments)    Makes stomach hurt    Social History:   reports that he has quit smoking. He does not have any smokeless tobacco history on file. He reports that he does not drink alcohol or use illicit drugs.  Family History: Family History  Problem Relation Age of Onset  . Diabetes Mother   . Hypertension Mother   . Stroke Mother   . Diabetes Sister   . Heart disease Sister   . Kidney disease Father      Physical Exam: Filed Vitals:   08/19/15 0326 08/19/15 0634 08/19/15 0845 08/19/15 0921  BP:  111/68  145/67  Pulse:  109  102  Temp:  97.8 F (36.6 C)  TempSrc:  Oral    Resp:  21    Height:      Weight:      SpO2: 92% 97% 97%    Blood pressure 145/67, pulse 102, temperature 97.8 F (36.6 C), temperature source Oral, resp. rate 21, height 6\' 1"  (1.854 m), weight 114.216 kg (251 lb 12.8 oz), SpO2 97 %.  GEN:  Pleasant  patient lying in the stretcher in no acute distress; cooperative with exam. PSYCH:  alert and oriented x4; does not appear anxious or depressed; affect is appropriate. HEENT: Mucous membranes pink and anicteric; PERRLA; EOM intact; no cervical lymphadenopathy nor thyromegaly or carotid bruit; no JVD; There were no stridor. Neck is very supple. Breasts:: Not examined CHEST WALL: No tenderness CHEST: Normal respiration, wheezing some with coughs, no rales.  HEART: Regular rate and rhythm.  There are no murmur, rub, or gallops.   BACK: No kyphosis or scoliosis; no CVA tenderness ABDOMEN: soft and non-tender; no masses, no organomegaly, normal abdominal bowel sounds; no pannus; no intertriginous candida. There is no  rebound and no distention. Rectal Exam: Not done EXTREMITIES: No bone or joint deformity; age-appropriate arthropathy of the hands and knees; no edema; no ulcerations.  There is no calf tenderness. Genitalia: not examined PULSES: 2+ and symmetric SKIN: Normal hydration no rash or ulceration CNS: Cranial nerves 2-12 grossly intact no focal lateralizing neurologic deficit.  Speech is fluent; uvula elevated with phonation, facial symmetry and tongue midline. DTR are normal bilaterally, cerebella exam is intact, barbinski is negative and strengths are equaled bilaterally.  No sensory loss.   Labs on Admission:  Basic Metabolic Panel:  Recent Labs Lab 08/18/15 1430 08/19/15 0654  NA 124* 127*  K 4.0 4.5  CL 93* 92*  CO2 21* 21*  GLUCOSE 77 228*  BUN 12 16  CREATININE 1.59* 1.43*  CALCIUM 8.9 9.9   Liver Function Tests:  Recent Labs Lab 08/18/15 1430 08/19/15 0654  AST 25 30  ALT 28 30  ALKPHOS 48 45  BILITOT 0.6 0.5  PROT 7.6 7.8  ALBUMIN 4.0 4.1   CBC:  Recent Labs Lab 08/18/15 1430 08/19/15 0654  WBC 7.9 8.1  NEUTROABS 3.9  --   HGB 11.5* 12.1*  HCT 34.4* 35.8*  MCV 86.4 85.9  PLT 233 240   Cardiac Enzymes:  Recent Labs Lab 08/18/15 1430  TROPONINI <0.03    CBG:  Recent Labs Lab 08/18/15 1839 08/18/15 2013 08/19/15 0721  GLUCAP 71 109* 214*     Radiological Exams on Admission: Dg Chest Portable 1 View  08/18/2015   CLINICAL DATA:  Shortness of breath and cough, progressing  EXAM: PORTABLE CHEST - 1 VIEW  COMPARISON:  July 24, 2015  FINDINGS: There is no edema or consolidation. The heart is upper normal in size with pulmonary vascularity within normal limits. No adenopathy. No pneumothorax. No bone lesions.  IMPRESSION: No edema or consolidation.   Electronically Signed   By: Lowella Grip III M.D.   On: 08/18/2015 14:46   Assessment/Plan Present on Admission:  . COPD with exacerbation . Essential hypertension . Hyponatremia . Tobacco  abuse . COPD exacerbation  PLAN:  Will change him to oral prednisone today.  Will add oral antibiotics as per inpatient Tx of COPD exacerbation recommendations.  He said he will not stop smoking.  He is stable, and will increase ambulation.  He has home oxygen and will plan on discharging him home tomorrow if he continues to improve.    Other  plans as per orders.  Code Status: FULL Haskel Khan, MD. [facility] Pager [phone] 7pm to 7am.  08/19/2015, 10:55 AM

## 2015-08-19 NOTE — Care Management Note (Signed)
Case Management Note  Patient Details  Name: [name] MRN: [id number] Date of Birth: [birth year]  Expected Discharge Date:  08/20/15               Expected Discharge Plan:  Home/Self Care  In-House Referral:  NA  Discharge planning Services  CM Consult  Post Acute Care Choice:  NA Choice offered to:  NA  DME Arranged:    DME Agency:     HH Arranged:    [place] Agency:     Status of Service:  Completed, signed off  Medicare Important Message Given:    Date Medicare IM Given:    Medicare IM give by:    Date Additional Medicare IM Given:    Additional Medicare Important Message give by:     If discussed at [employer] of Stay Meetings, dates discussed:    Additional Comments: Pt is from home and has CAP aid 7 days a week. Pt has home O2, neb, cane and walker. Pt plans to return home with CAP aid. No CM needs anticipated.  Sherald Barge, RN 08/19/2015, 1:50 PM

## 2015-08-19 NOTE — Progress Notes (Signed)
Dr. Marin Comment aware that patient does not have IV access.Patient difficult stick. IV medications changed to PO by physician.

## 2015-08-20 DIAGNOSIS — J42 Unspecified chronic bronchitis: Secondary | ICD-10-CM

## 2015-08-20 DIAGNOSIS — J441 Chronic obstructive pulmonary disease with (acute) exacerbation: Secondary | ICD-10-CM | POA: Diagnosis not present

## 2015-08-20 DIAGNOSIS — J449 Chronic obstructive pulmonary disease, unspecified: Secondary | ICD-10-CM | POA: Diagnosis present

## 2015-08-20 DIAGNOSIS — E669 Obesity, unspecified: Secondary | ICD-10-CM | POA: Diagnosis not present

## 2015-08-20 DIAGNOSIS — E119 Type 2 diabetes mellitus without complications: Secondary | ICD-10-CM | POA: Diagnosis not present

## 2015-08-20 LAB — GLUCOSE, CAPILLARY: Glucose-Capillary: 159 mg/dL — ABNORMAL HIGH (ref 65–99)

## 2015-08-20 MED ORDER — ALBUTEROL SULFATE (2.5 MG/3ML) 0.083% IN NEBU
2.5000 mg | INHALATION_SOLUTION | Freq: Two times a day (BID) | RESPIRATORY_TRACT | Status: DC
  Administered 2015-08-20: 2.5 mg via RESPIRATORY_TRACT
  Filled 2015-08-20: qty 3

## 2015-08-20 MED ORDER — LEVOFLOXACIN 500 MG PO TABS: 500.0000 mg | ORAL_TABLET | Freq: Every day | ORAL | Status: DC

## 2015-08-20 MED ORDER — ALBUTEROL SULFATE (2.5 MG/3ML) 0.083% IN NEBU: 2.5000 mg | INHALATION_SOLUTION | RESPIRATORY_TRACT | Status: DC | PRN

## 2015-08-20 MED ORDER — PREDNISONE 20 MG PO TABS: 20.0000 mg | ORAL_TABLET | Freq: Every day | ORAL | Status: DC

## 2015-08-20 MED ORDER — PREDNISONE 20 MG PO TABS: 40.0000 mg | ORAL_TABLET | Freq: Every day | ORAL | Status: DC

## 2015-08-20 MED ORDER — PREDNISONE 10 MG PO TABS: 10.0000 mg | ORAL_TABLET | Freq: Every day | ORAL | Status: DC

## 2015-08-20 MED ORDER — PREDNISONE 20 MG PO TABS: 60.0000 mg | ORAL_TABLET | Freq: Every day | ORAL | Status: DC

## 2015-08-20 MED ORDER — PREDNISONE 20 MG PO TABS: 30.0000 mg | ORAL_TABLET | Freq: Every day | ORAL | Status: DC

## 2015-08-20 MED ORDER — PREDNISONE 20 MG PO TABS: 50.0000 mg | ORAL_TABLET | Freq: Every day | ORAL | Status: DC

## 2015-08-20 NOTE — Care Management Note (Signed)
Case Management Note  Patient Details  Name: Matthew Molina MRN: [id number] Date of Birth: [birth year]   Expected Discharge Date:  08/20/15               Expected Discharge Plan:  Home/Self Care  In-House Referral:  NA  Discharge planning Services  CM Consult  Post Acute Care Choice:  NA Choice offered to:  NA  DME Arranged:    DME Agency:     HH Arranged:    [place] Agency:     Status of Service:  Completed, signed off  Medicare Important Message Given:    Date Medicare IM Given:    Medicare IM give by:    Date Additional Medicare IM Given:    Additional Medicare Important Message give by:     If discussed at [place] of Stay Meetings, dates discussed:    Additional Comments: Pt discharging home today, no CM needs.  Sherald Barge, RN 08/20/2015, 11:25 AM

## 2015-08-20 NOTE — Discharge Summary (Signed)
Physician Discharge Summary  Matthew Molina TXH:[id number] DOB: Jul 16, 1951 DOA: 08/18/2015  PCP: [name], MD  Admit date: 08/18/2015 Discharge date: 08/20/2015  Time spent:  minutes  Recommendations for Outpatient Follow-up:  1. Follow up with PCP next week.    Discharge Diagnoses:  Active Problems:   Essential hypertension   Diabetes mellitus type 2 in obese   COPD with exacerbation   Tobacco abuse   Hyponatremia   COPD exacerbation   Discharge Condition: improved.   Diet recommendation: Carb modified diet.   Filed Weights   08/18/15 1955  Weight: 114.216 kg (251 lb 12.8 oz)    History of present illness: patient was admitted by Dr Anastasio Champion on Aug 18, 2015 for COPD exacerbation.  As per his H and P: " Matthew Molina is a 64 y.o. male  This is a 64 year old man who was recently admitted and discharged from the hospital with COPD exacerbation. He presents once again with 12 hour history of dyspnea associated with productive cough of white sputum. There is no fever. He apparently has been on Zithromax with no relief. He continues to smoke one pack of cigarette per day. He is diabetic and hypertensive. He is now being admitted because his bronchospasm has not improved in the emergency room.   Hospital Course: Patient was admitted and was started on IV steroid, nebs and antibiotics.  He was recommended to stop cigarettes, but he openly said he will not.  He improved, and his IV steroid was changed to oral Prednisone.  He has home oxygen set up, and is anxious to be discharged home.  He was stable for discharged.  I will give him Prednisone 60mg  per day, decrease by 10mg  every 2 days until finish.  He has finished his Zithromax, so no antibiotics will be given. Thank you and Good day.     Consultations:  NONE>   Discharge Exam: Filed Vitals:   08/20/15 0424  BP: 136/75  Pulse: 98  Temp: 97.7 F (36.5 C)  Resp: 20   Discharge Instructions   Discharge Instructions     Diet - low sodium heart healthy    Complete by:  As directed      Discharge instructions    Complete by:  As directed   Take your prednisone with food.  Do not smoke and follow up with your doctor in a week.     Increase activity slowly    Complete by:  As directed           Current Discharge Medication List    START taking these medications   Details  !! levofloxacin (LEVAQUIN) 500 MG tablet Take 1 tablet (500 mg total) by mouth daily. Qty: 7 tablet, Refills: 0    !! levofloxacin (LEVAQUIN) 500 MG tablet Take 1 tablet (500 mg total) by mouth daily. Qty: 5 tablet, Refills: 0     !! - Potential duplicate medications found. Please discuss with provider.    CONTINUE these medications which have CHANGED   Details  !! predniSONE (DELTASONE) 20 MG tablet 2 tabs po daily x 3 days Qty: 6 tablet, Refills: 0    !! predniSONE (DELTASONE) 20 MG tablet Take 3 tablets (60 mg total) by mouth daily before breakfast. Qty: 42 tablet, Refills: 0     !! - Potential duplicate medications found. Please discuss with provider.    CONTINUE these medications which have NOT CHANGED   Details  albuterol (PROVENTIL HFA;VENTOLIN HFA) 108 (90 BASE) MCG/ACT inhaler Inhale  2 puffs into the lungs every 6 (six) hours as needed for wheezing or shortness of breath. Qty: 1 Inhaler, Refills: 2    amLODipine (NORVASC) 10 MG tablet TAKE (1) TABLET BY MOUTH DAILY. Qty: 30 tablet, Refills: 0    aspirin EC 81 MG tablet Take 81 mg by mouth daily.    budesonide-formoterol (SYMBICORT) 80-4.5 MCG/ACT inhaler INHALE 2 PUFFS INTO LUNGS TWICE DAILY. Qty: 10.2 g, Refills: 3    busPIRone (BUSPAR) 15 MG tablet Take 1 tablet (15 mg total) by mouth 2 (two) times daily.    ezetimibe (ZETIA) 10 MG tablet Take 1 tablet (10 mg total) by mouth daily.    glipiZIDE (GLUCOTROL XL) 10 MG 24 hr tablet Take 1 tablet (10 mg total) by mouth daily.    ipratropium-albuterol (DUONEB) 0.5-2.5 (3) MG/3ML SOLN Take 3 mLs by  nebulization every 4 (four) hours as needed. Qty: 360 mL, Refills: 1    metFORMIN (GLUCOPHAGE) 1000 MG tablet TAKE 1 TABLET BY MOUTH TWICE DAILY WITH FOOD FOR DIABETES.    nicotine (NICODERM CQ - DOSED IN MG/24 HOURS) 21 mg/24hr patch Place 1 patch (21 mg total) onto the skin daily. Qty: 28 patch, Refills: 0    polyethylene glycol powder (GLYCOLAX/MIRALAX) powder Take 17 g by mouth daily. Qty: 500 g, Refills: 3    pravastatin (PRAVACHOL) 40 MG tablet TAKE (1) TABLET BY MOUTH AT BEDTIME FOR CHOLESTEROL.    risperidone (RISPERDAL) 4 MG tablet Take 1 tablet (4 mg total) by mouth at bedtime.    SPIRIVA HANDIHALER 18 MCG inhalation capsule INHALE 1 CAPSULE AS DIRECTED ONCE A DAY. Qty: 30 capsule, Refills: 4    triamterene-hydrochlorothiazide (MAXZIDE-25) 37.5-25 MG per tablet TAKE (1) TABLET BY MOUTH ONCE DAILY.    valsartan (DIOVAN) 320 MG tablet Take 1 tablet (320 mg total) by mouth daily.      STOP taking these medications     ACCU-CHEK AVIVA PLUS test strip      azithromycin (ZITHROMAX) 500 MG tablet        Allergies  Allergen Reactions  . Sertraline Hcl     Stomach upset/pain  . Wellbutrin [Bupropion] Other (See Comments)    Makes stomach hurt      The results of significant diagnostics from this hospitalization (including imaging, microbiology, ancillary and laboratory) are listed below for reference.    Significant Diagnostic Studies: Dg Chest 2 View  07/24/2015   CLINICAL DATA:  64 year old male with cough and shortness of breath for 1 week.  EXAM: CHEST  2 VIEW  COMPARISON:  Chest x-ray 01/17/2015.  FINDINGS: Trace bilateral pleural effusions. No acute consolidative airspace disease. No evidence pulmonary edema. Heart size is normal. No pneumothorax. Upper mediastinal contours are within normal limits.  IMPRESSION: 1. Trace bilateral pleural effusions.   Electronically Signed   By: Vinnie Langton M.D.   On: 07/24/2015 10:27   Dg Chest Portable 1 View  08/18/2015    CLINICAL DATA:  Shortness of breath and cough, progressing  EXAM: PORTABLE CHEST - 1 VIEW  COMPARISON:  July 24, 2015  FINDINGS: There is no edema or consolidation. The heart is upper normal in size with pulmonary vascularity within normal limits. No adenopathy. No pneumothorax. No bone lesions.  IMPRESSION: No edema or consolidation.   Electronically Signed   By: Lowella Grip III M.D.   On: 08/18/2015 14:46   Dg Chest Portable 1 View  07/29/2015   CLINICAL DATA:  Shortness of breath starting last night  EXAM: PORTABLE  CHEST - 1 VIEW  COMPARISON:  None.  FINDINGS: Study is limited by poor inspiration. No acute infiltrate or pleural effusion. No pulmonary edema. Mild basilar atelectasis.  IMPRESSION: Limited study by poor inspiration. Mild basilar atelectasis. No pulmonary edema.   Electronically Signed   By: Lahoma Crocker M.D.   On: 07/29/2015 10:20    Microbiology: No results found for this or any previous visit (from the past 240 hour(s)).   Labs: Basic Metabolic Panel:  Recent Labs Lab 08/18/15 1430 08/19/15 0654  NA 124* 127*  K 4.0 4.5  CL 93* 92*  CO2 21* 21*  GLUCOSE 77 228*  BUN 12 16  CREATININE 1.59* 1.43*  CALCIUM 8.9 9.9   Liver Function Tests:  Recent Labs Lab 08/18/15 1430 08/19/15 0654  AST 25 30  ALT 28 30  ALKPHOS 48 45  BILITOT 0.6 0.5  PROT 7.6 7.8  ALBUMIN 4.0 4.1   No results for input(s): LIPASE, AMYLASE in the last 168 hours. No results for input(s): AMMONIA in the last 168 hours. CBC:  Recent Labs Lab 08/18/15 1430 08/19/15 0654  WBC 7.9 8.1  NEUTROABS 3.9  --   HGB 11.5* 12.1*  HCT 34.4* 35.8*  MCV 86.4 85.9  PLT 233 240   Cardiac Enzymes:  Recent Labs Lab 08/18/15 1430  TROPONINI <0.03   BNP: BNP (last 3 results)  Recent Labs  01/17/15 1705 07/29/15 0955 08/18/15 1430  BNP 16.0 21.0 12.0    ProBNP (last 3 results) No results for input(s): PROBNP in the last 8760 hours.  CBG:  Recent Labs Lab 08/19/15 0721  08/19/15 1151 08/19/15 1650 08/19/15 2053 08/20/15 0719  GLUCAP 214* 159* 207* 295* 159*    Signed:  Jacoby Ritsema  [facility] 08/20/2015, 9:40 AM

## 2015-08-20 NOTE — Progress Notes (Signed)
Patient with orders to be discharge home. Discharge instructions given, patient verbalized understanding. Patient stable. Patient left in private vehicle with family.  

## 2015-08-25 ENCOUNTER — Ambulatory Visit (HOSPITAL_COMMUNITY)
Admission: RE | Admit: 2015-08-25 | Discharge: 2015-08-25 | Disposition: A | Discharge status: Home / Self Care | Payer: Commercial Managed Care - HMO | Source: Ambulatory Visit | Attending: Pulmonary Disease | Admitting: Pulmonary Disease

## 2015-08-25 DIAGNOSIS — J441 Chronic obstructive pulmonary disease with (acute) exacerbation: Secondary | ICD-10-CM | POA: Diagnosis present

## 2015-08-25 MED ORDER — ALBUTEROL SULFATE (2.5 MG/3ML) 0.083% IN NEBU
2.5000 mg | INHALATION_SOLUTION | Freq: Once | RESPIRATORY_TRACT | Status: AC
  Administered 2015-08-25: 2.5 mg via RESPIRATORY_TRACT

## 2015-08-27 ENCOUNTER — Encounter (HOSPITAL_COMMUNITY): Payer: Self-pay | Admitting: Psychiatry

## 2015-08-27 ENCOUNTER — Ambulatory Visit (INDEPENDENT_AMBULATORY_CARE_PROVIDER_SITE_OTHER): Payer: Medicare HMO | Admitting: Psychiatry

## 2015-08-27 VITALS — BP 108/67 | HR 93 | Ht 73.0 in | Wt 257.0 lb

## 2015-08-27 DIAGNOSIS — F259 Schizoaffective disorder, unspecified: Secondary | ICD-10-CM

## 2015-08-27 DIAGNOSIS — F200 Paranoid schizophrenia: Secondary | ICD-10-CM

## 2015-08-27 MED ORDER — BUSPIRONE HCL 15 MG PO TABS: 15.0000 mg | ORAL_TABLET | Freq: Two times a day (BID) | ORAL | Status: DC

## 2015-08-27 MED ORDER — RISPERIDONE 4 MG PO TABS: 4.0000 mg | ORAL_TABLET | Freq: Every day | ORAL | Status: DC

## 2015-08-27 NOTE — Progress Notes (Signed)
Patient ID: OLIVERIO CHO, male   DOB: 1951-06-24, 64 y.o.   MRN: [id number] Patient ID: ERICA OSUNA, male   DOB: January 20, 1951, 64 y.o.   MRN: [id number]  Psychiatric Assessment Adult  Patient Identification:  Matthew Molina Date of Evaluation:  08/27/2015 Chief Complaint: "I was in the hospital recently" History of Chief Complaint:   Chief Complaint  Patient presents with  . Schizophrenia  . Follow-up    Anxiety Symptoms include nervous/anxious behavior.     this patient is a 64 year old divorced black male who lives alone in [place]. He has 2 daughters but they do not live in the area. He used to work in a [employer] but has been on disability since 1996 for schizophrenia.  The patient was referred by his primary physician, Dr. [name], for further assessment and treatment of schizophrenia.  The patient is a poor historian but thinks he became mentally ill sometime in his 73s. In his 57s he used a lot of marijuana pills and LSD which may have contributed to the mental illness. He also drank quite a bit of alcohol. Around age 68 however he "got saved" and stopped all of this.  Apparently his symptoms started when he worked in [employer]. He began hearing voices and later became increasingly paranoid. He used to go to treatment at the [facility] and later at [facility] and families. He doesn't remember the medications that he's taken over the years. He's been on Risperdal for quite a while and last year his symptoms worsened and had to be increased from 4-5 mg at bedtime. He has been only hospitalized once at [facility] years ago after he was involuntarily committed by a psychiatrist at the [facility].  Currently the patient states that he still hears a little bit of voices in the evenings. They're telling him to drink or gamble but he doesn't listen to them. He denies being paranoid but sometimes feels like he is being watched. He is most  concerned because he is getting more depressed. His mood is low and he is more withdrawn than he used to be. He reads his Bible but doesn't go to church like he did before. His energy is low but he is eating and sleeping well. He is anxious but [name] put him on BuSpar which is helped to some degree. He denies overt panic attacks He doesn't have any thoughts of hurting self or others. He is fairly compliant with his medicines but sometimes forgets and has to have his neighbor help him. He tends to stay isolated. He does not use any drugs or alcohol currently  The patient returns after 3 months. He was hospitalized twice in August for COPD exacerbations. He is finally decided to quit smoking and has not smoked in about 10 days now. He states at first this was difficult but he is doing fine with it now. He is going back to see [name] next week. His mood is been stable and he denies auditory or visual hallucinations. He does not have any thoughts of hurting self or others. He's not using drugs or alcohol Review of Systems  Constitutional: Positive for activity change.  HENT: Negative.   Eyes: Negative.   Respiratory: Negative.   Cardiovascular: Negative.   Gastrointestinal: Negative.   Endocrine: Negative.   Genitourinary: Negative.   Musculoskeletal: Positive for joint swelling, arthralgias and gait problem.  Skin: Negative.   Allergic/Immunologic: Negative.   Neurological:  Negative.   Hematological: Negative.   Psychiatric/Behavioral: Positive for hallucinations and dysphoric mood. The patient is nervous/anxious.    Physical Exam not done  Depressive Symptoms: depressed mood, anhedonia, psychomotor retardation, anxiety, loss of energy/fatigue,  (Hypo) Manic Symptoms:   Elevated Mood:  No Irritable Mood:  No Grandiosity:  No Distractibility:  No Labiality of Mood:  No Delusions:  Yes Hallucinations:  Yes Impulsivity:  No Sexually Inappropriate Behavior:  No Financial  Extravagance:  No Flight of Ideas:  No  Anxiety Symptoms: Excessive Worry:  Yes Panic Symptoms:  No Agoraphobia:  No Obsessive Compulsive: No  Symptoms: None, Specific Phobias:  No Social Anxiety:  No  Psychotic Symptoms:  Hallucinations: Yes Auditory Delusions:  Yes Paranoia:  Yes   Ideas of Reference:  No  PTSD Symptoms: Ever had a traumatic exposure:  No Had a traumatic exposure in the last month:  No Re-experiencing: No None Hypervigilance:  No Hyperarousal: No None Avoidance: No None  Traumatic Brain Injury: Yes fell down while playing as a child  Past Psychiatric History: Diagnosis: Schizophrenia   Hospitalizations: Once in the past at [facility]   Outpatient Care: For many years at [place]: none  Self-Mutilation: none  Suicidal Attempts: none  Violent Behaviors: none   Past Medical History:   Past Medical History  Diagnosis Date  . Nicotine addiction   . Schizophrenia   . Hyperlipidemia   . Obesity   . Hypertension   . Diabetes mellitus   . Arthritis   . Acute respiratory failure with hypoxia   . Acute kidney injury   . Multiple lung nodules on CT 04/02/2015  . COPD (chronic obstructive pulmonary disease)   . Diabetes mellitus without complication   . Depression    History of Loss of Consciousness:  No Seizure History:  No Cardiac History:  No Allergies:   Allergies  Allergen Reactions  . Sertraline Hcl     Stomach upset/pain  . Wellbutrin [Bupropion] Other (See Comments)    Makes stomach hurt   Current Medications:  Current Outpatient Prescriptions  Medication Sig Dispense Refill  . albuterol (PROVENTIL HFA;VENTOLIN HFA) 108 (90 BASE) MCG/ACT inhaler Inhale 2 puffs into the lungs every 6 (six) hours as needed for wheezing or shortness of breath. 1 Inhaler 2  . amLODipine (NORVASC) 10 MG tablet TAKE (1) TABLET BY MOUTH DAILY. 30 tablet 0  . aspirin EC 81 MG tablet Take 81 mg by  mouth daily.    . budesonide-formoterol (SYMBICORT) 80-4.5 MCG/ACT inhaler INHALE 2 PUFFS INTO LUNGS TWICE DAILY. 10.2 g 3  . busPIRone (BUSPAR) 15 MG tablet Take 1 tablet (15 mg total) by mouth 2 (two) times daily. 60 tablet 2  . ezetimibe (ZETIA) 10 MG tablet Take 1 tablet (10 mg total) by mouth daily.    Marland Kitchen glipiZIDE (GLUCOTROL XL) 10 MG 24 hr tablet Take 1 tablet (10 mg total) by mouth daily.    Marland Kitchen ipratropium-albuterol (DUONEB) 0.5-2.5 (3) MG/3ML SOLN Take 3 mLs by nebulization every 4 (four) hours as needed. (Patient taking differently: Take 3 mLs by nebulization every 4 (four) hours as needed (shortness of breath). ) 360 mL 1  . levofloxacin (LEVAQUIN) 500 MG tablet Take 1 tablet (500 mg total) by mouth daily. 7 tablet 0  . metFORMIN (GLUCOPHAGE) 1000 MG tablet TAKE 1 TABLET BY MOUTH TWICE DAILY WITH FOOD FOR DIABETES.    Marland Kitchen nicotine (NICODERM CQ - DOSED IN MG/24 HOURS)  21 mg/24hr patch Place 1 patch (21 mg total) onto the skin daily. 28 patch 0  . polyethylene glycol powder (GLYCOLAX/MIRALAX) powder Take 17 g by mouth daily. 500 g 3  . pravastatin (PRAVACHOL) 40 MG tablet TAKE (1) TABLET BY MOUTH AT BEDTIME FOR CHOLESTEROL.    Marland Kitchen predniSONE (DELTASONE) 20 MG tablet Take 3 tablets (60 mg total) by mouth daily before breakfast. 42 tablet 0  . risperidone (RISPERDAL) 4 MG tablet Take 1 tablet (4 mg total) by mouth at bedtime. 30 tablet 2  . SPIRIVA HANDIHALER 18 MCG inhalation capsule INHALE 1 CAPSULE AS DIRECTED ONCE A DAY. 30 capsule 4  . triamterene-hydrochlorothiazide (MAXZIDE-25) 37.5-25 MG per tablet TAKE (1) TABLET BY MOUTH ONCE DAILY.    . valsartan (DIOVAN) 320 MG tablet Take 1 tablet (320 mg total) by mouth daily.    Marland Kitchen levofloxacin (LEVAQUIN) 500 MG tablet Take 1 tablet (500 mg total) by mouth daily. (Patient not taking: Reported on 08/27/2015) 5 tablet 0  . predniSONE (DELTASONE) 20 MG tablet 2 tabs po daily x 3 days (Patient not taking: Reported on 08/27/2015) 6 tablet 0  . [DISCONTINUED]  sildenafil (VIAGRA) 100 MG tablet Take 100 mg by mouth. Take one tablet 30 mins before intercourse      No current facility-administered medications for this visit.    Previous Psychotropic Medications:  Medication Dose   Zoloft, doesn't remember the name of the others                        Substance Abuse History in the last 12 months: Substance Age of 1st Use Last Use Amount Specific Type  Nicotine    smokes one to 2 packs per day    Alcohol      Cannabis      Opiates      Cocaine      Methamphetamines      LSD      Ecstasy      Benzodiazepines      Caffeine      Inhalants      Others:                          Medical Consequences of Substance Abuse: Use of hallucinogens in the past may have contributed to his mental illness  Legal Consequences of Substance Abuse: none  Family Consequences of Substance Abuse: none  Blackouts:  No DT's:  No Withdrawal Symptoms:  No None  Social History: Current Place of Residence: [place] of Birth: [place] Family Members: One brother, 4 sisters Marital Status:  Divorced Children:   Sons:   Daughters: 2 Relationships:  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 Legal History: Arrested in his 8s for DUI, later arrested for an decent exposure while he was mentally ill in his 34s Hobbies/Interests: Reading his Bible  Family History:   Family History  Problem Relation Age of Onset  . Diabetes Mother   . Hypertension Mother   . Stroke Mother   . Diabetes Sister   . Heart disease Sister   . Kidney disease Father     Mental Status Examination/Evaluation: Objective:  Appearance: Casual and Fairly Groomed  Eye Contact::  fair  Speech:  Slow  Volume:  Decreased  Mood:  Slightly anxious   Affect:  Blunt  Thought Process:  Coherent   Orientation:  Full (Time, Place, and Person)  Thought Content: Denies hallucinations today   Suicidal Thoughts:  No  Homicidal Thoughts:  No  Judgement:  Fair  Insight:  Fair  Psychomotor Activity:  Normal  Akathisia:  No  Handed:  Right  AIMS (if indicated):  Occasional tremors in  hands but none are evident today no other tardive dyskinesia symptoms   Assets:  Communication Skills Desire for Improvement Resilience    Laboratory/X-Ray Psychological Evaluation(s)  His PSA is elevated. A1c is elevated slightly as well as triglycerides. [name] is following all of this. Cholesterol is good      Assessment:  Axis I: Schizoaffective Disorder  AXIS I Schizoaffective Disorder  AXIS II Deferred  AXIS III Past Medical History  Diagnosis Date  . Nicotine addiction   . Schizophrenia   . Hyperlipidemia   . Obesity   . Hypertension   . Diabetes mellitus   . Arthritis   . Acute respiratory failure with hypoxia   . Acute kidney injury   . Multiple lung nodules on CT 04/02/2015  . COPD (chronic obstructive pulmonary disease)   . Diabetes mellitus without complication   . Depression      AXIS IV other psychosocial or environmental problems  AXIS V 51-60 moderate symptoms   Treatment Plan/Recommendations:  Plan of Care: Medication management   Laboratory already ordered   Psychotherapy "not indicated   Medications: He will continue Risperdal 4 mg at bedtime for his schizophrenic-psychotic symptoms. He can continue BuSpar 15 mg twice a day for anxiety   Routine PRN Medications:  No  Consultations:   Safety Concerns:  He denies thoughts of harm to self or others   Other: He'll return in 3 months     Levonne Spiller, MD 9/9/20163:49 PM

## 2015-08-30 ENCOUNTER — Encounter: Payer: Self-pay | Admitting: Family Medicine

## 2015-08-30 ENCOUNTER — Ambulatory Visit (INDEPENDENT_AMBULATORY_CARE_PROVIDER_SITE_OTHER): Payer: Commercial Managed Care - HMO | Admitting: Family Medicine

## 2015-08-30 VITALS — BP 100/74 | HR 87 | Resp 16 | Ht 73.0 in | Wt 255.0 lb

## 2015-08-30 DIAGNOSIS — Z23 Encounter for immunization: Secondary | ICD-10-CM

## 2015-08-30 DIAGNOSIS — I10 Essential (primary) hypertension: Secondary | ICD-10-CM

## 2015-08-30 DIAGNOSIS — E119 Type 2 diabetes mellitus without complications: Secondary | ICD-10-CM

## 2015-08-30 DIAGNOSIS — E1169 Type 2 diabetes mellitus with other specified complication: Secondary | ICD-10-CM

## 2015-08-30 DIAGNOSIS — Z720 Tobacco use: Secondary | ICD-10-CM | POA: Diagnosis not present

## 2015-08-30 DIAGNOSIS — Z09 Encounter for follow-up examination after completed treatment for conditions other than malignant neoplasm: Secondary | ICD-10-CM

## 2015-08-30 DIAGNOSIS — E785 Hyperlipidemia, unspecified: Secondary | ICD-10-CM | POA: Diagnosis not present

## 2015-08-30 DIAGNOSIS — E669 Obesity, unspecified: Secondary | ICD-10-CM

## 2015-08-30 DIAGNOSIS — J449 Chronic obstructive pulmonary disease, unspecified: Secondary | ICD-10-CM

## 2015-08-30 MED ORDER — BUDESONIDE-FORMOTEROL FUMARATE 80-4.5 MCG/ACT IN AERO: INHALATION_SPRAY | RESPIRATORY_TRACT | Status: DC

## 2015-08-30 MED ORDER — IPRATROPIUM-ALBUTEROL 0.5-2.5 (3) MG/3ML IN SOLN: 3.0000 mL | RESPIRATORY_TRACT | Status: DC | PRN

## 2015-08-30 NOTE — Progress Notes (Signed)
Subjective:    Patient ID: Matthew Molina, male    DOB: October 15, 1951, 64 y.o.   MRN: [id number]  HPI   Matthew Molina     MRN: [id number]      DOB: 06-22-1951   HPI Matthew Molina is here for follow up of recent hospitalization, once again for COPD flare, he continues to smoke , and feels incapable of quitting at thsi time. Reports improved breathing, denies fever or chills, still needs some of his maintainance medication.  He also has  re-evaluation of chronic medical conditions, medication management and review of any available recent lab and radiology data.  Preventive health is updated, specifically  Cancer screening and Immunization.    The PT denies any adverse reactions to current medications since the last visit.  Denies polyuria, polydipsia, blurred vision , or hypoglycemic episodes. C/o bruise on abdomen, non tender and hardening which started since hospital d/c ROS Denies recent fever or chills. Denies sinus pressure, nasal congestion, ear pain or sore throat. Denies chest congestion, productive cough or wheezing. Denies chest pains, palpitations and leg swelling Denies abdominal pain, nausea, vomiting,diarrhea or constipation.   Denies dysuria, frequency, hesitancy or incontinence. C/o chronic  joint pain, swelling and limitation in mobility. Denies headaches, seizures, numbness, or tingling. Denies uncontrolled  depression, anxiety or insomnia. Denies skin break down or rash.   PE  BP 100/74 mmHg  Pulse 87  Resp 16  Ht 6\' 1"  (1.854 m)  Wt 255 lb (115.667 kg)  BMI 33.65 kg/m2  SpO2 96%  Patient alert and oriented and in no cardiopulmonary distress.  HEENT: No facial asymmetry, EOMI,   oropharynx pink and moist.  Neck supple no JVD, no mass.  Chest: decreased air entry throughout, scattered crackles and few wheezes. CVS: S1, S2 no murmurs, no S3.Regular rate.  ABD: Soft non tender. Bruising on anterior abdomen where pt had recently been receiving lovenox  prophylaxis, palpable nodule , no erythema i or warmth  Ext: No edema  MS: Adequate though reduced  ROM spine, shoulders, hips and knees.  Skin: Intact, no ulcerations or rash noted.  Psych: Good eye contact, normal affect. Memory intact not anxious or depressed appearing.  CNS: CN 2-12 intact, power,  normal throughout.no focal deficits noted.   [facility] discharge follow-up Improved, still does not have all medications he needs , citing financial strain a contributor, states he will "soon get the medication" and not keen on [facility] involvement / pharmacy consult at this time Will monitor closely , as he has had another hospital visit in recent past for cOPD flare  Tobacco abuse Patient counseled for approximately 5 minutes regarding the health risks of ongoing nicotine use, specifically all types of cancer, heart disease, stroke and respiratory failure. The options available for help with cessation ,the behavioral changes to assist the process, and the option to either gradully reduce usage  Or abruptly stop.is also discussed. Pt is also encouraged to set specific goals in number of cigarettes used daily, as well as to set a quit date.  Number of cigarettes/cigars currently smoking daily: 15 to 20   Diabetes mellitus type 2 in obese Matthew Molina is reminded of the importance of commitment to daily physical activity for 30 minutes or more, as able and the need to limit carbohydrate intake to 30 to 60 grams per meal to help with blood sugar control.   The need to take medication as prescribed, test blood sugar as directed,  and to call between visits if there is a concern that blood sugar is uncontrolled is also discussed.  Controlled, no change in medication  Matthew Molina is reminded of the importance of daily foot exam, annual eye examination, and good blood sugar, blood pressure and cholesterol control.  Diabetic Labs Latest Ref Rng 08/19/2015 08/18/2015 07/29/2015  07/26/2015 07/25/2015  HbA1c 4.8 - 5.6 % - - - - 6.3(H)  Microalbumin <2.0 mg/dL - - - - -  Micro/Creat Ratio 0.0 - 30.0 mg/g - - - - -  Chol 0 - 200 mg/dL - - - - -  HDL >=40 mg/dL - - - - -  Calc LDL 0 - 99 mg/dL - - - - -  Triglycerides <150 mg/dL - - - - -  Creatinine 0.61 - 1.24 mg/dL 1.43(H) 1.59(H) 1.42(H) 1.31(H) 1.53(H)   BP/Weight 08/30/2015 08/20/2015 08/18/2015 07/29/2015 07/26/2015 04/23/2015 2/69/4854  Systolic BP 627 035 - [phone] 937  Diastolic BP 74 75 - 76 63 66 70  Wt. (Lbs) 255 - 251.8 250 251.4 - 253.8  BMI 33.65 - 33.23 32.99 33.18 - 33.49  Some encounter information is confidential and restricted. Go to Review Flowsheets activity to see all data.   Foot/eye exam completion dates Latest Ref Rng 08/30/2015 01/27/2015  Eye Exam No Retinopathy - No Retinopathy  Foot Form Completion - Done -         Essential hypertension Controlled, no change in medication DASH diet and commitment to daily physical activity for a minimum of 30 minutes discussed and encouraged, as a part of hypertension management. The importance of attaining a healthy weight is also discussed.  BP/Weight 08/30/2015 08/20/2015 08/18/2015 07/29/2015 07/26/2015 04/23/2015 1/69/6789  Systolic BP 381 017 - 510 258 527 782  Diastolic BP 74 75 - 76 63 66 70  Wt. (Lbs) 255 - 251.8 250 251.4 - 253.8  BMI 33.65 - 33.23 32.99 33.18 - 33.49  Some encounter information is confidential and restricted. Go to Review Flowsheets activity to see all data.        Obesity (BMI 30.0-34.9) Deteriorated. Patient re-educated about  the importance of commitment to a  minimum of 150 minutes of exercise per week.  The importance of healthy food choices with portion control discussed. Encouraged to start a food diary, count calories and to consider  joining a support group. Sample diet sheets offered. Goals set by the patient for the next several months.   Weight /BMI 08/30/2015 08/18/2015 07/29/2015  WEIGHT 255 lb 251 lb 12.8 oz  250 lb  HEIGHT 6\' 1"  6\' 1"  6\' 1"   BMI 33.65 kg/m2 33.23 kg/m2 32.99 kg/m2  Some encounter information is confidential and restricted. Go to Review Flowsheets activity to see all data.    Current exercise per week 60 minutes.]   COPD (chronic obstructive pulmonary disease) Progressively worsening due to longstanding nicotine use, cessation  counseling done , and importance of using neb treatment s and MDI's a s directed is stresed      Review of Systems     Objective:   Physical Exam        Assessment & Plan:

## 2015-08-30 NOTE — Patient Instructions (Addendum)
Annual physical in January, call if you needme before  Flu vaccine today  Fasting labs Dec 20 or after HBA1C, lipid, cmp and eGFr, microalb  Use spiriva and symbicort every  Day please and cut out smoking as much as ppossible   Rept chest scan will be ordered  Please work on good  health habits so that your health will improve. 1. Commitment to daily physical activity for 30 to 60  minutes, if you are able to do this.  2. Commitment to wise food choices. Aim for half of your  food intake to be vegetable and fruit, one quarter starchy foods, and one quarter protein. Try to eat on a regular schedule  3 meals per day, snacking between meals should be limited to vegetables or fruits or small portions of nuts. 64 ounces of water per day is generally recommended, unless you have specific health conditions, like heart failure or kidney failure where you will need to limit fluid intake.  3. Commitment to sufficient and a  good quality of physical and mental rest daily, generally between 6 to 8 hours per day.  WITH PERSISTANCE AND PERSEVERANCE, THE IMPOSSIBLE , BECOMES THE NORM! Thanks for choosing [facility], we consider it a privelige to serve you.

## 2015-08-31 ENCOUNTER — Telehealth: Payer: Self-pay | Admitting: *Deleted

## 2015-08-31 ENCOUNTER — Other Ambulatory Visit: Payer: Self-pay

## 2015-08-31 MED ORDER — PREDNISONE 10 MG (21) PO TBPK: 10.0000 mg | ORAL_TABLET | Freq: Every day | ORAL | Status: DC

## 2015-08-31 NOTE — Telephone Encounter (Signed)
Pt called requesting a refill on prednisone to [facility].

## 2015-08-31 NOTE — Telephone Encounter (Signed)
D/c instructions have confusing directions, HOWEVER, has had 2 recent in house stays, has been on 40 mg daily for 3 days,this is printed you may need to call the pharmacy also as 20 mg deltasone script may also be on hold there for him  ??? pls ask I will send in a 10 mg dosdse pak oNLY one time, he also needs to aggressively try to  fill the symbicort which he NEEDS

## 2015-08-31 NOTE — Telephone Encounter (Signed)
Called pharmacy.  Will only get dose pak.  Did not have 40mg  dose filled.

## 2015-08-31 NOTE — Telephone Encounter (Signed)
Patient is asking for refill on Prednisone prescribed by hospitalist at discharge.   I tried to explain to patient that this is not a long term medication and that he needs to use inhalers once he completes this.   He is persistent in receiving this refill.

## 2015-08-31 NOTE — Telephone Encounter (Signed)
Patient aware.

## 2015-09-01 LAB — PULMONARY FUNCTION TEST
DL/VA % pred: 107 %
DL/VA: 5.11 ml/min/mmHg/L
DLCO unc % pred: 64 %
DLCO unc: 23.44 ml/min/mmHg
FEF 25-75 Post: 3.49 L/sec
FEF 25-75 Pre: 2.06 L/sec
FEF2575-%Change-Post: 69 %
FEF2575-%Pred-Post: 117 %
FEF2575-%Pred-Pre: 69 %
FEV1-%Change-Post: 16 %
FEV1-%Pred-Post: 78 %
FEV1-%Pred-Pre: 67 %
FEV1-Post: 2.64 L
FEV1-Pre: 2.27 L
FEV1FVC-%Change-Post: 1 %
FEV1FVC-%Pred-Pre: 101 %
FEV6-%Change-Post: 14 %
FEV6-%Pred-Post: 78 %
FEV6-%Pred-Pre: 68 %
FEV6-Post: 3.32 L
FEV6-Pre: 2.89 L
FEV6FVC-%Pred-Post: 104 %
FEV6FVC-%Pred-Pre: 104 %
FVC-%Change-Post: 14 %
FVC-%Pred-Post: 75 %
FVC-%Pred-Pre: 66 %
FVC-Post: 3.32 L
FVC-Pre: 2.89 L
Post FEV1/FVC ratio: 80 %
Post FEV6/FVC ratio: 100 %
Pre FEV1/FVC ratio: 78 %
Pre FEV6/FVC Ratio: 100 %
RV % pred: 116 %
RV: 2.92 L
TLC % pred: 74 %
TLC: 5.64 L

## 2015-09-07 ENCOUNTER — Ambulatory Visit: Payer: Commercial Managed Care - HMO | Admitting: Family Medicine

## 2015-09-19 ENCOUNTER — Encounter: Payer: Self-pay | Admitting: Family Medicine

## 2015-09-19 DIAGNOSIS — Z09 Encounter for follow-up examination after completed treatment for conditions other than malignant neoplasm: Secondary | ICD-10-CM | POA: Insufficient documentation

## 2015-09-19 NOTE — Assessment & Plan Note (Signed)
Patient counseled for approximately 5 minutes regarding the health risks of ongoing nicotine use, specifically all types of cancer, heart disease, stroke and respiratory failure. The options available for help with cessation ,the behavioral changes to assist the process, and the option to either gradully reduce usage  Or abruptly stop.is also discussed. Pt is also encouraged to set specific goals in number of cigarettes used daily, as well as to set a quit date.  Number of cigarettes/cigars currently smoking daily: 15 to 20  

## 2015-09-19 NOTE — Assessment & Plan Note (Signed)
Matthew Molina is reminded of the importance of commitment to daily physical activity for 30 minutes or more, as able and the need to limit carbohydrate intake to 30 to 60 grams per meal to help with blood sugar control.   The need to take medication as prescribed, test blood sugar as directed, and to call between visits if there is a concern that blood sugar is uncontrolled is also discussed.  Controlled, no change in medication  Matthew Molina is reminded of the importance of daily foot exam, annual eye examination, and good blood sugar, blood pressure and cholesterol control.  Diabetic Labs Latest Ref Rng 08/19/2015 08/18/2015 07/29/2015 07/26/2015 07/25/2015  HbA1c 4.8 - 5.6 % - - - - 6.3(H)  Microalbumin <2.0 mg/dL - - - - -  Micro/Creat Ratio 0.0 - 30.0 mg/g - - - - -  Chol 0 - 200 mg/dL - - - - -  HDL >=40 mg/dL - - - - -  Calc LDL 0 - 99 mg/dL - - - - -  Triglycerides <150 mg/dL - - - - -  Creatinine 0.61 - 1.24 mg/dL 1.43(H) 1.59(H) 1.42(H) 1.31(H) 1.53(H)   BP/Weight 08/30/2015 08/20/2015 08/18/2015 07/29/2015 07/26/2015 04/23/2015 6/73/4193  Systolic BP 790 240 - 973 532 992 426  Diastolic BP 74 75 - 76 63 66 70  Wt. (Lbs) 255 - 251.8 250 251.4 - 253.8  BMI 33.65 - 33.23 32.99 33.18 - 33.49  Some encounter information is confidential and restricted. Go to Review Flowsheets activity to see all data.   Foot/eye exam completion dates Latest Ref Rng 08/30/2015 01/27/2015  Eye Exam No Retinopathy - No Retinopathy  Foot Form Completion - Done -

## 2015-09-19 NOTE — Assessment & Plan Note (Signed)
Improved, still does not have all medications he needs , citing financial strain a contributor, states he will "soon get the medication" and not keen on [facility] involvement / pharmacy consult at this time Will monitor closely , as he has had another hospital visit in recent past for cOPD flare

## 2015-09-19 NOTE — Assessment & Plan Note (Signed)
Deteriorated. Patient re-educated about  the importance of commitment to a  minimum of 150 minutes of exercise per week.  The importance of healthy food choices with portion control discussed. Encouraged to start a food diary, count calories and to consider  joining a support group. Sample diet sheets offered. Goals set by the patient for the next several months.   Weight /BMI 08/30/2015 08/18/2015 07/29/2015  WEIGHT 255 lb 251 lb 12.8 oz 250 lb  HEIGHT 6\' 1"  6\' 1"  6\' 1"   BMI 33.65 kg/m2 33.23 kg/m2 32.99 kg/m2  Some encounter information is confidential and restricted. Go to Review Flowsheets activity to see all data.    Current exercise per week 60 minutes.]

## 2015-09-19 NOTE — Assessment & Plan Note (Signed)
Controlled, no change in medication DASH diet and commitment to daily physical activity for a minimum of 30 minutes discussed and encouraged, as a part of hypertension management. The importance of attaining a healthy weight is also discussed.  BP/Weight 08/30/2015 08/20/2015 08/18/2015 07/29/2015 07/26/2015 04/23/2015 6/82/5749  Systolic BP 355 217 - 471 595 396 728  Diastolic BP 74 75 - 76 63 66 70  Wt. (Lbs) 255 - 251.8 250 251.4 - 253.8  BMI 33.65 - 33.23 32.99 33.18 - 33.49  Some encounter information is confidential and restricted. Go to Review Flowsheets activity to see all data.

## 2015-09-19 NOTE — Assessment & Plan Note (Signed)
Progressively worsening due to longstanding nicotine use, cessation  counseling done , and importance of using neb treatment s and MDI's a s directed is stresed

## 2015-10-06 ENCOUNTER — Telehealth: Payer: Self-pay

## 2015-10-06 DIAGNOSIS — E1169 Type 2 diabetes mellitus with other specified complication: Secondary | ICD-10-CM

## 2015-10-06 DIAGNOSIS — E669 Obesity, unspecified: Secondary | ICD-10-CM

## 2015-10-06 NOTE — Telephone Encounter (Signed)
Referral entered  

## 2015-10-26 ENCOUNTER — Other Ambulatory Visit (HOSPITAL_COMMUNITY): Payer: Self-pay | Admitting: Psychiatry

## 2015-10-30 ENCOUNTER — Emergency Department (HOSPITAL_COMMUNITY)
Admission: EM | Admit: 2015-10-30 | Discharge: 2015-10-30 | Disposition: A | Discharge status: Home / Self Care | Payer: Commercial Managed Care - HMO | Attending: Emergency Medicine | Admitting: Emergency Medicine

## 2015-10-30 ENCOUNTER — Encounter (HOSPITAL_COMMUNITY): Payer: Self-pay | Admitting: Emergency Medicine

## 2015-10-30 ENCOUNTER — Emergency Department (HOSPITAL_COMMUNITY): Payer: Commercial Managed Care - HMO

## 2015-10-30 DIAGNOSIS — Y998 Other external cause status: Secondary | ICD-10-CM | POA: Diagnosis not present

## 2015-10-30 DIAGNOSIS — M1990 Unspecified osteoarthritis, unspecified site: Secondary | ICD-10-CM | POA: Insufficient documentation

## 2015-10-30 DIAGNOSIS — Z720 Tobacco use: Secondary | ICD-10-CM | POA: Insufficient documentation

## 2015-10-30 DIAGNOSIS — E119 Type 2 diabetes mellitus without complications: Secondary | ICD-10-CM | POA: Insufficient documentation

## 2015-10-30 DIAGNOSIS — W208XXA Other cause of strike by thrown, projected or falling object, initial encounter: Secondary | ICD-10-CM | POA: Diagnosis not present

## 2015-10-30 DIAGNOSIS — S0083XA Contusion of other part of head, initial encounter: Secondary | ICD-10-CM | POA: Diagnosis not present

## 2015-10-30 DIAGNOSIS — Z7951 Long term (current) use of inhaled steroids: Secondary | ICD-10-CM | POA: Diagnosis not present

## 2015-10-30 DIAGNOSIS — F209 Schizophrenia, unspecified: Secondary | ICD-10-CM | POA: Diagnosis not present

## 2015-10-30 DIAGNOSIS — F329 Major depressive disorder, single episode, unspecified: Secondary | ICD-10-CM | POA: Diagnosis not present

## 2015-10-30 DIAGNOSIS — E785 Hyperlipidemia, unspecified: Secondary | ICD-10-CM | POA: Diagnosis not present

## 2015-10-30 DIAGNOSIS — Z87448 Personal history of other diseases of urinary system: Secondary | ICD-10-CM | POA: Insufficient documentation

## 2015-10-30 DIAGNOSIS — Z79899 Other long term (current) drug therapy: Secondary | ICD-10-CM | POA: Diagnosis not present

## 2015-10-30 DIAGNOSIS — I10 Essential (primary) hypertension: Secondary | ICD-10-CM | POA: Diagnosis not present

## 2015-10-30 DIAGNOSIS — E669 Obesity, unspecified: Secondary | ICD-10-CM | POA: Insufficient documentation

## 2015-10-30 DIAGNOSIS — J449 Chronic obstructive pulmonary disease, unspecified: Secondary | ICD-10-CM | POA: Insufficient documentation

## 2015-10-30 DIAGNOSIS — Z7982 Long term (current) use of aspirin: Secondary | ICD-10-CM | POA: Insufficient documentation

## 2015-10-30 DIAGNOSIS — Y9289 Other specified places as the place of occurrence of the external cause: Secondary | ICD-10-CM | POA: Insufficient documentation

## 2015-10-30 DIAGNOSIS — S0990XA Unspecified injury of head, initial encounter: Secondary | ICD-10-CM

## 2015-10-30 DIAGNOSIS — Y9389 Activity, other specified: Secondary | ICD-10-CM | POA: Insufficient documentation

## 2015-10-30 MED ORDER — ACETAMINOPHEN 500 MG PO TABS
1000.0000 mg | ORAL_TABLET | Freq: Once | ORAL | Status: AC
  Administered 2015-10-30: 1000 mg via ORAL
  Filled 2015-10-30: qty 2

## 2015-10-30 NOTE — ED Notes (Signed)
Discharge instructions given to pt . Reviewed s/s of head injury - verbalized understanding. Ambulated off unit with family member

## 2015-10-30 NOTE — Discharge Instructions (Signed)
You did not have any serious injury involving her head. Continue to take Tylenol for headache as needed. Return without fail for worsening symptoms, including worsening pain, vomiting unable to keep down food or fluids, confusion, inability to walk, or any other symptoms concerning to you.  Head Injury, Adult You have a head injury. Headaches and throwing up (vomiting) are common after a head injury. It should be easy to wake up from sleeping. Sometimes you must stay in the hospital. Most problems happen within the first 24 hours. Side effects may occur up to 7-10 days after the injury.  WHAT ARE THE TYPES OF HEAD INJURIES? Head injuries can be as minor as a bump. Some head injuries can be more severe. More severe head injuries include:  A jarring injury to the brain (concussion).  A bruise of the brain (contusion). This mean there is bleeding in the brain that can cause swelling.  A cracked skull (skull fracture).  Bleeding in the brain that collects, clots, and forms a bump (hematoma). WHEN SHOULD I GET HELP RIGHT AWAY?   You are confused or sleepy.  You cannot be woken up.  You feel sick to your stomach (nauseous) or keep throwing up (vomiting).  Your dizziness or unsteadiness is getting worse.  You have very bad, lasting headaches that are not helped by medicine. Take medicines only as told by your doctor.  You cannot use your arms or legs like normal.  You cannot walk.  You notice changes in the black spots in the center of the colored part of your eye (pupil).  You have clear or bloody fluid coming from your nose or ears.  You have trouble seeing. During the next 24 hours after the injury, you must stay with someone who can watch you. This person should get help right away (call 911 in the [place] if you start to shake and are not able to control it (have seizures), you pass out, or you are unable to wake up. HOW CAN I PREVENT A HEAD INJURY IN THE FUTURE?  Wear seat  belts.  Wear a helmet while bike riding and playing sports like football.  Stay away from dangerous activities around the house. WHEN CAN I RETURN TO NORMAL ACTIVITIES AND ATHLETICS? See your doctor before doing these activities. You should not do normal activities or play contact sports until 1 week after the following symptoms have stopped:  Headache that does not go away.  Dizziness.  Poor attention.  Confusion.  Memory problems.  Sickness to your stomach or throwing up.  Tiredness.  Fussiness.  Bothered by bright lights or loud noises.  Anxiousness or depression.  Restless sleep. MAKE SURE YOU:   Understand these instructions.  Will watch your condition.  Will get help right away if you are not doing well or get worse.   This information is not intended to replace advice given to you by your health care provider. Make sure you discuss any questions you have with your health care provider.   Document Released: 11/16/2008 Document Revised: 12/25/2014 Document Reviewed: 08/11/2013 Elsevier Interactive Patient Education [employer].
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
## 2015-11-03 ENCOUNTER — Telehealth: Payer: Self-pay

## 2015-11-03 ENCOUNTER — Telehealth: Payer: Self-pay | Admitting: Family Medicine

## 2015-11-03 DIAGNOSIS — E119 Type 2 diabetes mellitus without complications: Secondary | ICD-10-CM

## 2015-11-03 NOTE — Telephone Encounter (Signed)
Wrong patient

## 2015-11-03 NOTE — Telephone Encounter (Signed)
Referral entered  

## 2015-11-03 NOTE — Telephone Encounter (Signed)
Matthew Molina is requesting a referral to the [facility], he states that he is having feet trouble, please advise?

## 2015-11-05 ENCOUNTER — Encounter (HOSPITAL_COMMUNITY): Payer: Self-pay

## 2015-11-05 ENCOUNTER — Emergency Department (HOSPITAL_COMMUNITY): Payer: Commercial Managed Care - HMO

## 2015-11-05 ENCOUNTER — Inpatient Hospital Stay (HOSPITAL_COMMUNITY)
Admission: EM | Admit: 2015-11-05 | Discharge: 2015-11-06 | DRG: 192 | Disposition: A | Discharge status: Home / Self Care | Payer: Commercial Managed Care - HMO | Attending: Internal Medicine | Admitting: Internal Medicine

## 2015-11-05 DIAGNOSIS — J441 Chronic obstructive pulmonary disease with (acute) exacerbation: Principal | ICD-10-CM | POA: Diagnosis present

## 2015-11-05 DIAGNOSIS — F17200 Nicotine dependence, unspecified, uncomplicated: Secondary | ICD-10-CM | POA: Diagnosis present

## 2015-11-05 DIAGNOSIS — E119 Type 2 diabetes mellitus without complications: Secondary | ICD-10-CM

## 2015-11-05 DIAGNOSIS — E7800 Pure hypercholesterolemia, unspecified: Secondary | ICD-10-CM | POA: Diagnosis present

## 2015-11-05 DIAGNOSIS — Z833 Family history of diabetes mellitus: Secondary | ICD-10-CM | POA: Diagnosis not present

## 2015-11-05 DIAGNOSIS — Z9981 Dependence on supplemental oxygen: Secondary | ICD-10-CM | POA: Diagnosis not present

## 2015-11-05 DIAGNOSIS — I10 Essential (primary) hypertension: Secondary | ICD-10-CM | POA: Diagnosis present

## 2015-11-05 DIAGNOSIS — J449 Chronic obstructive pulmonary disease, unspecified: Secondary | ICD-10-CM | POA: Diagnosis present

## 2015-11-05 HISTORY — DX: Pure hypercholesterolemia, unspecified: E78.00

## 2015-11-05 LAB — CBC WITH DIFFERENTIAL/PLATELET
Basophils Absolute: 0.1 10*3/uL (ref 0.0–0.1)
Basophils Relative: 1 %
Eosinophils Absolute: 1 10*3/uL — ABNORMAL HIGH (ref 0.0–0.7)
Eosinophils Relative: 11 %
HCT: 37.5 % — ABNORMAL LOW (ref 39.0–52.0)
Hemoglobin: 12.3 g/dL — ABNORMAL LOW (ref 13.0–17.0)
Lymphocytes Relative: 27 %
Lymphs Abs: 2.4 10*3/uL (ref 0.7–4.0)
MCH: 29.1 pg (ref 26.0–34.0)
MCHC: 32.8 g/dL (ref 30.0–36.0)
MCV: 88.7 fL (ref 78.0–100.0)
Monocytes Absolute: 0.7 10*3/uL (ref 0.1–1.0)
Monocytes Relative: 7 %
Neutro Abs: 4.9 10*3/uL (ref 1.7–7.7)
Neutrophils Relative %: 54 %
Platelets: 268 10*3/uL (ref 150–400)
RBC: 4.23 MIL/uL (ref 4.22–5.81)
RDW: 15.7 % — ABNORMAL HIGH (ref 11.5–15.5)
WBC: 9 10*3/uL (ref 4.0–10.5)

## 2015-11-05 LAB — COMPREHENSIVE METABOLIC PANEL
ALT: 30 U/L (ref 17–63)
AST: 31 U/L (ref 15–41)
Albumin: 4.4 g/dL (ref 3.5–5.0)
Alkaline Phosphatase: 45 U/L (ref 38–126)
Anion gap: 14 (ref 5–15)
BUN: 18 mg/dL (ref 6–20)
CO2: 22 mmol/L (ref 22–32)
Calcium: 10 mg/dL (ref 8.9–10.3)
Chloride: 98 mmol/L — ABNORMAL LOW (ref 101–111)
Creatinine, Ser: 1.74 mg/dL — ABNORMAL HIGH (ref 0.61–1.24)
GFR calc Af Amer: 46 mL/min — ABNORMAL LOW (ref >60)
GFR calc non Af Amer: 39 mL/min — ABNORMAL LOW (ref >60)
Glucose, Bld: 152 mg/dL — ABNORMAL HIGH (ref 65–99)
Potassium: 3.7 mmol/L (ref 3.5–5.1)
Sodium: 134 mmol/L — ABNORMAL LOW (ref 135–145)
Total Bilirubin: 0.4 mg/dL (ref 0.3–1.2)
Total Protein: 7.8 g/dL (ref 6.5–8.1)

## 2015-11-05 LAB — GLUCOSE, CAPILLARY: Glucose-Capillary: 195 mg/dL — ABNORMAL HIGH (ref 65–99)

## 2015-11-05 MED ORDER — ALBUTEROL SULFATE (2.5 MG/3ML) 0.083% IN NEBU
2.5000 mg | INHALATION_SOLUTION | Freq: Once | RESPIRATORY_TRACT | Status: AC
  Administered 2015-11-05: 2.5 mg via RESPIRATORY_TRACT
  Filled 2015-11-05: qty 3

## 2015-11-05 MED ORDER — METHYLPREDNISOLONE SODIUM SUCC 125 MG IJ SOLR
125.0000 mg | Freq: Four times a day (QID) | INTRAMUSCULAR | Status: DC
  Administered 2015-11-05 – 2015-11-06 (×3): 125 mg via INTRAVENOUS
  Filled 2015-11-05 (×3): qty 2

## 2015-11-05 MED ORDER — AMLODIPINE BESYLATE 5 MG PO TABS: 10.0000 mg | ORAL_TABLET | Freq: Every day | ORAL | Status: DC

## 2015-11-05 MED ORDER — GLIPIZIDE ER 5 MG PO TB24
10.0000 mg | ORAL_TABLET | Freq: Every day | ORAL | Status: DC
  Administered 2015-11-06: 10 mg via ORAL
  Filled 2015-11-05 (×2): qty 1
  Filled 2015-11-05: qty 2
  Filled 2015-11-05: qty 1

## 2015-11-05 MED ORDER — IPRATROPIUM-ALBUTEROL 0.5-2.5 (3) MG/3ML IN SOLN
3.0000 mL | Freq: Once | RESPIRATORY_TRACT | Status: AC
  Administered 2015-11-05: 3 mL via RESPIRATORY_TRACT
  Filled 2015-11-05: qty 3

## 2015-11-05 MED ORDER — BUDESONIDE-FORMOTEROL FUMARATE 80-4.5 MCG/ACT IN AERO
1.0000 | INHALATION_SPRAY | Freq: Two times a day (BID) | RESPIRATORY_TRACT | Status: DC
  Administered 2015-11-05 – 2015-11-06 (×2): 1 via RESPIRATORY_TRACT
  Filled 2015-11-05: qty 6.9

## 2015-11-05 MED ORDER — ALBUTEROL SULFATE (2.5 MG/3ML) 0.083% IN NEBU: 2.5000 mg | INHALATION_SOLUTION | RESPIRATORY_TRACT | Status: DC | PRN

## 2015-11-05 MED ORDER — IPRATROPIUM-ALBUTEROL 0.5-2.5 (3) MG/3ML IN SOLN
3.0000 mL | RESPIRATORY_TRACT | Status: DC
  Administered 2015-11-05 – 2015-11-06 (×4): 3 mL via RESPIRATORY_TRACT
  Filled 2015-11-05 (×4): qty 3

## 2015-11-05 MED ORDER — TRIAMTERENE-HCTZ 37.5-25 MG PO TABS
1.0000 | ORAL_TABLET | Freq: Every day | ORAL | Status: DC
  Administered 2015-11-06: 1 via ORAL
  Filled 2015-11-05: qty 1

## 2015-11-05 MED ORDER — ENOXAPARIN SODIUM 60 MG/0.6ML ~~LOC~~ SOLN
60.0000 mg | SUBCUTANEOUS | Status: DC
  Administered 2015-11-05: 60 mg via SUBCUTANEOUS
  Filled 2015-11-05: qty 0.6

## 2015-11-05 MED ORDER — IPRATROPIUM-ALBUTEROL 0.5-2.5 (3) MG/3ML IN SOLN
3.0000 mL | RESPIRATORY_TRACT | Status: DC | PRN
  Administered 2015-11-05: 3 mL via RESPIRATORY_TRACT
  Filled 2015-11-05: qty 3

## 2015-11-05 MED ORDER — ALBUTEROL SULFATE HFA 108 (90 BASE) MCG/ACT IN AERS: 1.0000 | INHALATION_SPRAY | Freq: Four times a day (QID) | RESPIRATORY_TRACT | Status: DC | PRN

## 2015-11-05 MED ORDER — METHYLPREDNISOLONE SODIUM SUCC 125 MG IJ SOLR
125.0000 mg | Freq: Once | INTRAMUSCULAR | Status: AC
  Administered 2015-11-05: 125 mg via INTRAVENOUS
  Filled 2015-11-05: qty 2

## 2015-11-05 MED ORDER — ONDANSETRON HCL 4 MG/2ML IJ SOLN
4.0000 mg | Freq: Four times a day (QID) | INTRAMUSCULAR | Status: DC | PRN
Start: 2015-11-05 — End: 2015-11-06

## 2015-11-05 MED ORDER — GUAIFENESIN-DM 100-10 MG/5ML PO SYRP
5.0000 mL | ORAL_SOLUTION | ORAL | Status: DC | PRN
  Administered 2015-11-05: 5 mL via ORAL
  Filled 2015-11-05: qty 5

## 2015-11-05 MED ORDER — PRAVASTATIN SODIUM 40 MG PO TABS: 40.0000 mg | ORAL_TABLET | Freq: Every day | ORAL | Status: DC

## 2015-11-05 MED ORDER — METFORMIN HCL 500 MG PO TABS: 1000.0000 mg | ORAL_TABLET | Freq: Two times a day (BID) | ORAL | Status: DC

## 2015-11-05 MED ORDER — METHYLPREDNISOLONE SODIUM SUCC 125 MG IJ SOLR: 125.0000 mg | Freq: Four times a day (QID) | INTRAMUSCULAR | Status: DC

## 2015-11-05 MED ORDER — RISPERIDONE 1 MG PO TABS: 5.0000 mg | ORAL_TABLET | Freq: Every day | ORAL | Status: DC

## 2015-11-05 MED ORDER — INSULIN ASPART 100 UNIT/ML ~~LOC~~ SOLN: 0.0000 [IU] | Freq: Every day | SUBCUTANEOUS | Status: DC

## 2015-11-05 MED ORDER — ENOXAPARIN SODIUM 40 MG/0.4ML ~~LOC~~ SOLN: 40.0000 mg | SUBCUTANEOUS | Status: DC

## 2015-11-05 MED ORDER — ONDANSETRON HCL 4 MG PO TABS
4.0000 mg | ORAL_TABLET | Freq: Four times a day (QID) | ORAL | Status: DC | PRN
Start: 2015-11-05 — End: 2015-11-06

## 2015-11-05 MED ORDER — IRBESARTAN 300 MG PO TABS
300.0000 mg | ORAL_TABLET | Freq: Every day | ORAL | Status: DC
  Administered 2015-11-06: 300 mg via ORAL
  Filled 2015-11-05: qty 1

## 2015-11-05 MED ORDER — AMLODIPINE BESYLATE 5 MG PO TABS
10.0000 mg | ORAL_TABLET | Freq: Every day | ORAL | Status: DC
  Administered 2015-11-06: 10 mg via ORAL
  Filled 2015-11-05: qty 2

## 2015-11-05 MED ORDER — EZETIMIBE 10 MG PO TABS
10.0000 mg | ORAL_TABLET | Freq: Every day | ORAL | Status: DC
  Administered 2015-11-06: 10 mg via ORAL
  Filled 2015-11-05: qty 1

## 2015-11-05 MED ORDER — RISPERIDONE 1 MG PO TABS
1.0000 mg | ORAL_TABLET | Freq: Every day | ORAL | Status: DC
  Administered 2015-11-05: 1 mg via ORAL
  Filled 2015-11-05: qty 1

## 2015-11-05 MED ORDER — ALBUTEROL SULFATE HFA 108 (90 BASE) MCG/ACT IN AERS
1.0000 | INHALATION_SPRAY | Freq: Four times a day (QID) | RESPIRATORY_TRACT | Status: DC | PRN
  Filled 2015-11-05: qty 6.7

## 2015-11-05 MED ORDER — METFORMIN HCL 500 MG PO TABS
1000.0000 mg | ORAL_TABLET | Freq: Two times a day (BID) | ORAL | Status: DC
  Administered 2015-11-06: 1000 mg via ORAL
  Filled 2015-11-05: qty 2

## 2015-11-05 MED ORDER — INSULIN ASPART 100 UNIT/ML ~~LOC~~ SOLN
0.0000 [IU] | Freq: Three times a day (TID) | SUBCUTANEOUS | Status: DC
  Administered 2015-11-06: 15 [IU] via SUBCUTANEOUS
  Administered 2015-11-06: 11 [IU] via SUBCUTANEOUS

## 2015-11-05 MED ORDER — BUDESONIDE-FORMOTEROL FUMARATE 80-4.5 MCG/ACT IN AERO
INHALATION_SPRAY | RESPIRATORY_TRACT | Status: AC
  Filled 2015-11-05: qty 6.9

## 2015-11-05 MED ORDER — RISPERIDONE 1 MG PO TABS
4.0000 mg | ORAL_TABLET | Freq: Every day | ORAL | Status: DC
  Administered 2015-11-05: 4 mg via ORAL
  Filled 2015-11-05: qty 4

## 2015-11-05 MED ORDER — SODIUM CHLORIDE 0.9 % IV SOLN
INTRAVENOUS | Status: DC
  Administered 2015-11-05: 23:00:00 via INTRAVENOUS

## 2015-11-05 NOTE — H&P (Signed)
[facility] History and Physical  Matthew Molina [id number] DOB: 1951-09-09 DOA: 11/05/2015  Referring physician: ER PCP: [name], MD   Chief Complaint: Dyspnea  HPI: Matthew Molina is a 64 y.o. male  This is a 64 year old man who has a history of COPD and became short of breath that started this morning. He has been wheezing associated with a cough productive of white sputum. He denies any fever. He uses oxygen at home. Denies any chest pain or palpitations. He has not improved significantly in the emergency room and he requires admission.   Review of Systems:  Apart from symptoms above, all systems are negative.  Past Medical History  Diagnosis Date  . COPD (chronic obstructive pulmonary disease) ([place])   . Diabetes mellitus without complication ([place])   . Hypertension   . Hypercholesterolemia    History reviewed. No pertinent past surgical history. Social History:  reports that he has been smoking.  He does not have any smokeless tobacco history on file. He reports that he does not drink alcohol or use illicit drugs.  Allergies  Allergen Reactions  . Wellbutrin [Bupropion]   . Zoloft [Sertraline Hcl]      family history: His sister had diabetes.   Prior to Admission medications   Medication Sig Start Date End Date Taking? Authorizing Provider  amLODipine (NORVASC) 10 MG tablet Take 10 mg by mouth daily. 10/26/15  Yes Historical Provider, MD  glipiZIDE (GLUCOTROL XL) 10 MG 24 hr tablet Take 10 mg by mouth daily. 10/26/15  Yes Historical Provider, MD  ipratropium-albuterol (DUONEB) 0.5-2.5 (3) MG/3ML SOLN Inhale 3 mLs into the lungs every 4 (four) hours as needed (for shortness of breath/wheezing).  08/30/15  Yes Historical Provider, MD  metFORMIN (GLUCOPHAGE) 1000 MG tablet Take 1,000 mg by mouth 2 (two) times daily. 10/26/15  Yes Historical Provider, MD  pravastatin (PRAVACHOL) 40 MG tablet Take 40 mg by mouth daily. 10/26/15  Yes Historical Provider, MD    risperiDONE (RISPERDAL) 1 MG tablet Take 1 mg by mouth at bedtime. 09/20/15  Yes Historical Provider, MD  risperidone (RISPERDAL) 4 MG tablet Take 4 mg by mouth at bedtime. 10/26/15  Yes Historical Provider, MD  SYMBICORT 80-4.5 MCG/ACT inhaler Inhale 1 puff into the lungs 2 (two) times daily. 10/26/15  Yes Historical Provider, MD  triamterene-hydrochlorothiazide (MAXZIDE-25) 37.5-25 MG tablet Take 1 tablet by mouth daily. 10/26/15  Yes Historical Provider, MD  valsartan (DIOVAN) 320 MG tablet Take 320 mg by mouth daily. 10/26/15  Yes Historical Provider, MD  VENTOLIN HFA 108 (90 BASE) MCG/ACT inhaler Inhale 1-2 puffs into the lungs every 6 (six) hours as needed for wheezing or shortness of breath.  11/03/15  Yes Historical Provider, MD  ZETIA 10 MG tablet Take 10 mg by mouth daily. 10/26/15  Yes Historical Provider, MD   Physical Exam: Filed Vitals:   11/05/15 1600 11/05/15 1630 11/05/15 1730 11/05/15 1824  BP: 110/50 115/68 110/69 121/70  Pulse: 121 46 128 112  Temp:    98.1 F (36.7 C)  TempSrc:    Oral  Resp: 39 26  18  Height:      Weight:      SpO2: 92% 91% 91% 92%    Wt Readings from Last 3 Encounters:  11/05/15 113.399 kg (250 lb)    General:  Appears somewhat dyspneic at rest. There is no peripheral or central cyanosis. Eyes: PERRL, normal lids, irises & conjunctiva ENT: grossly normal hearing, lips & tongue Neck: no LAD, masses or thyromegaly  Cardiovascular: RRR, no m/r/g. No LE edema. Telemetry: SR, no arrhythmias  Respiratory: Bilateral wheezing, moderately tight. There are no crackles or bronchial breathing. Abdomen: soft, ntnd Skin: no rash or induration seen on limited exam Musculoskeletal: grossly normal tone BUE/BLE Psychiatric: grossly normal mood and affect, speech fluent and appropriate Neurologic: grossly non-focal.          Labs on Admission:  Basic Metabolic Panel:  Recent Labs Lab 11/05/15 1540  NA 134*  K 3.7  CL 98*  CO2 22  GLUCOSE 152*  BUN  18  CREATININE 1.74*  CALCIUM 10.0   Liver Function Tests:  Recent Labs Lab 11/05/15 1540  AST 31  ALT 30  ALKPHOS 45  BILITOT 0.4  PROT 7.8  ALBUMIN 4.4   No results for input(s): LIPASE, AMYLASE in the last 168 hours. No results for input(s): AMMONIA in the last 168 hours. CBC:  Recent Labs Lab 11/05/15 1540  WBC 9.0  NEUTROABS 4.9  HGB 12.3*  HCT 37.5*  MCV 88.7  PLT 268   Cardiac Enzymes: No results for input(s): CKTOTAL, CKMB, CKMBINDEX, TROPONINI in the last 168 hours.  BNP (last 3 results) No results for input(s): BNP in the last 8760 hours.  ProBNP (last 3 results) No results for input(s): PROBNP in the last 8760 hours.  CBG: No results for input(s): GLUCAP in the last 168 hours.  Radiological Exams on Admission: Dg Chest Portable 1 View  11/05/2015  CLINICAL DATA:  Short of breath. History of COPD. Also constantly coughing. EXAM: PORTABLE CHEST 1 VIEW COMPARISON:  None. FINDINGS: Cardiac silhouette normal in size and configuration. No mediastinal or hilar masses or evidence of adenopathy. Clear lungs.  No pleural effusion or pneumothorax. Bony thorax is intact. IMPRESSION: No active disease. Electronically Signed   By: Lajean Manes M.D.   On: 11/05/2015 15:35      Assessment/Plan   1. COPD exacerbation. He will be treated with IV steroids and bronchodilators. I do not think he requires antibiotics at this stage. 2. Diabetes. Continue with home medications and sliding scale insulin. 3. Hypertension. Continue with home medications.  He'll be admitted to the medical floor. Further recommendations will depend on patient's hospital progress.   Code Status: Full code.  DVT Prophylaxis: Lovenox.  Family Communication: I discussed the plan with the patient at the bedside.   Disposition Plan: Home when medically stable.  Time spent: 45 minutes.  Doree Albee [facility] Pager [phone].

## 2015-11-05 NOTE — ED Notes (Signed)
Pt reports productive cough with white sputum and sob that started today.  Denies chest pain.  Denis fever.

## 2015-11-05 NOTE — ED Provider Notes (Signed)
CSN: [id number]     Arrival date & time 11/05/15  1503 History   First MD Initiated Contact with Patient 11/05/15 1515     Chief Complaint  Patient presents with  . Shortness of Breath     (Consider location/radiation/quality/duration/timing/severity/associated sxs/prior Treatment) Patient is a 64 y.o. male presenting with shortness of breath. The history is provided by the patient (Patient complains of shortness breath and wheezing for a few days. Patient uses O2 at home.).  Shortness of Breath Severity:  Moderate Onset quality:  Gradual Timing:  Constant Progression:  Waxing and waning Chronicity:  Recurrent Context: activity   Associated symptoms: wheezing   Associated symptoms: no abdominal pain, no chest pain, no cough, no headaches and no rash     Past Medical History  Diagnosis Date  . COPD (chronic obstructive pulmonary disease) ([place])   . Diabetes mellitus without complication ([place])   . Hypertension   . Hypercholesterolemia    History reviewed. No pertinent past surgical history. No family history on file. Social History  Substance Use Topics  . Smoking status: Current Every Day Smoker  . Smokeless tobacco: None  . Alcohol Use: No    Review of Systems  Constitutional: Negative for appetite change and fatigue.  HENT: Negative for congestion, ear discharge and sinus pressure.   Eyes: Negative for discharge.  Respiratory: Positive for shortness of breath and wheezing. Negative for cough.   Cardiovascular: Negative for chest pain.  Gastrointestinal: Negative for abdominal pain and diarrhea.  Genitourinary: Negative for frequency and hematuria.  Musculoskeletal: Negative for back pain.  Skin: Negative for rash.  Neurological: Negative for seizures and headaches.  Psychiatric/Behavioral: Negative for hallucinations.      Allergies  Wellbutrin and Zoloft  Home Medications   Prior to Admission medications   Medication Sig Start Date End Date Taking?  Authorizing Provider  amLODipine (NORVASC) 10 MG tablet Take 10 mg by mouth daily. 10/26/15  Yes Historical Provider, MD  glipiZIDE (GLUCOTROL XL) 10 MG 24 hr tablet Take 10 mg by mouth daily. 10/26/15  Yes Historical Provider, MD  ipratropium-albuterol (DUONEB) 0.5-2.5 (3) MG/3ML SOLN Inhale 3 mLs into the lungs every 4 (four) hours as needed (for shortness of breath/wheezing).  08/30/15  Yes Historical Provider, MD  metFORMIN (GLUCOPHAGE) 1000 MG tablet Take 1,000 mg by mouth 2 (two) times daily. 10/26/15  Yes Historical Provider, MD  pravastatin (PRAVACHOL) 40 MG tablet Take 40 mg by mouth daily. 10/26/15  Yes Historical Provider, MD  risperiDONE (RISPERDAL) 1 MG tablet Take 1 mg by mouth at bedtime. 09/20/15  Yes Historical Provider, MD  risperidone (RISPERDAL) 4 MG tablet Take 4 mg by mouth at bedtime. 10/26/15  Yes Historical Provider, MD  SYMBICORT 80-4.5 MCG/ACT inhaler Inhale 1 puff into the lungs 2 (two) times daily. 10/26/15  Yes Historical Provider, MD  triamterene-hydrochlorothiazide (MAXZIDE-25) 37.5-25 MG tablet Take 1 tablet by mouth daily. 10/26/15  Yes Historical Provider, MD  valsartan (DIOVAN) 320 MG tablet Take 320 mg by mouth daily. 10/26/15  Yes Historical Provider, MD  VENTOLIN HFA 108 (90 BASE) MCG/ACT inhaler Inhale 1-2 puffs into the lungs every 6 (six) hours as needed for wheezing or shortness of breath.  11/03/15  Yes Historical Provider, MD  ZETIA 10 MG tablet Take 10 mg by mouth daily. 10/26/15  Yes Historical Provider, MD   BP 110/69 mmHg  Pulse 128  Temp(Src) 98.1 F (36.7 C) (Oral)  Resp 26  Ht 6\' 1"  (1.854 m)  Wt 250 lb (113.399  kg)  BMI 32.99 kg/m2  SpO2 91% Physical Exam  Constitutional: He is oriented to person, place, and time. He appears well-developed.  HENT:  Head: Normocephalic.  Eyes: Conjunctivae and EOM are normal. No scleral icterus.  Neck: Neck supple. No thyromegaly present.  Cardiovascular: Normal rate and regular rhythm.  Exam reveals no gallop and  no friction rub.   No murmur heard. Pulmonary/Chest: No stridor. He has wheezes. He has no rales. He exhibits no tenderness.  Tachypnea  Abdominal: He exhibits no distension. There is no tenderness. There is no rebound.  Musculoskeletal: Normal range of motion. He exhibits no edema.  Lymphadenopathy:    He has no cervical adenopathy.  Neurological: He is oriented to person, place, and time. He exhibits normal muscle tone. Coordination normal.  Skin: No rash noted. No erythema.  Psychiatric: He has a normal mood and affect. His behavior is normal.    ED Course  Procedures (including critical care time) Labs Review Labs Reviewed  CBC WITH DIFFERENTIAL/PLATELET - Abnormal; Notable for the following:    Hemoglobin 12.3 (*)    HCT 37.5 (*)    RDW 15.7 (*)    Eosinophils Absolute 1.0 (*)    All other components within normal limits  COMPREHENSIVE METABOLIC PANEL - Abnormal; Notable for the following:    Sodium 134 (*)    Chloride 98 (*)    Glucose, Bld 152 (*)    Creatinine, Ser 1.74 (*)    GFR calc non Af Amer 39 (*)    GFR calc Af Amer 46 (*)    All other components within normal limits    Imaging Review Dg Chest Portable 1 View  11/05/2015  CLINICAL DATA:  Short of breath. History of COPD. Also constantly coughing. EXAM: PORTABLE CHEST 1 VIEW COMPARISON:  None. FINDINGS: Cardiac silhouette normal in size and configuration. No mediastinal or hilar masses or evidence of adenopathy. Clear lungs.  No pleural effusion or pneumothorax. Bony thorax is intact. IMPRESSION: No active disease. Electronically Signed   By: Lajean Manes M.D.   On: 11/05/2015 15:35   I have personally reviewed and evaluated these images and lab results as part of my medical decision-making.   EKG Interpretation   Date/Time:  Friday November 05 2015 15:15:33 EST Ventricular Rate:  114 PR Interval:  181 QRS Duration: 85 QT Interval:  336 QTC Calculation: 463 R Axis:   -75 Text Interpretation:  Sinus  tachycardia Left anterior fascicular block  Abnormal R-wave progression, early transition Minimal ST depression,  anterolateral leads Confirmed by Shahiem Bedwell  MD, Delisha Peaden [phone]) on 11/05/2015  5:45:29 PM      MDM   Final diagnoses:  COPD exacerbation ([place])    Patient will be admitted for COPD exacerbation    Milton Ferguson, MD 11/05/15 1819

## 2015-11-06 LAB — COMPREHENSIVE METABOLIC PANEL
ALT: 30 U/L (ref 17–63)
AST: 37 U/L (ref 15–41)
Albumin: 4.3 g/dL (ref 3.5–5.0)
Alkaline Phosphatase: 43 U/L (ref 38–126)
Anion gap: 14 (ref 5–15)
BUN: 24 mg/dL — ABNORMAL HIGH (ref 6–20)
CO2: 18 mmol/L — ABNORMAL LOW (ref 22–32)
Calcium: 9.9 mg/dL (ref 8.9–10.3)
Chloride: 98 mmol/L — ABNORMAL LOW (ref 101–111)
Creatinine, Ser: 1.66 mg/dL — ABNORMAL HIGH (ref 0.61–1.24)
GFR calc Af Amer: 48 mL/min — ABNORMAL LOW (ref >60)
GFR calc non Af Amer: 42 mL/min — ABNORMAL LOW (ref >60)
Glucose, Bld: 295 mg/dL — ABNORMAL HIGH (ref 65–99)
Potassium: 4.2 mmol/L (ref 3.5–5.1)
Sodium: 130 mmol/L — ABNORMAL LOW (ref 135–145)
Total Bilirubin: 0.6 mg/dL (ref 0.3–1.2)
Total Protein: 8.2 g/dL — ABNORMAL HIGH (ref 6.5–8.1)

## 2015-11-06 LAB — CBC
HCT: 37.5 % — ABNORMAL LOW (ref 39.0–52.0)
Hemoglobin: 12.3 g/dL — ABNORMAL LOW (ref 13.0–17.0)
MCH: 29 pg (ref 26.0–34.0)
MCHC: 32.8 g/dL (ref 30.0–36.0)
MCV: 88.4 fL (ref 78.0–100.0)
Platelets: 269 10*3/uL (ref 150–400)
RBC: 4.24 MIL/uL (ref 4.22–5.81)
RDW: 15.6 % — ABNORMAL HIGH (ref 11.5–15.5)
WBC: 12.4 10*3/uL — ABNORMAL HIGH (ref 4.0–10.5)

## 2015-11-06 LAB — GLUCOSE, CAPILLARY
Glucose-Capillary: 277 mg/dL — ABNORMAL HIGH (ref 65–99)
Glucose-Capillary: 339 mg/dL — ABNORMAL HIGH (ref 65–99)

## 2015-11-06 MED ORDER — PREDNISONE 10 MG PO TABS: 10.0000 mg | ORAL_TABLET | Freq: Every day | ORAL | Status: DC

## 2015-11-06 NOTE — Progress Notes (Signed)
Pt discharged via wheelchair into the care of his neighbor via private vehicle in stable condition.  VS:  WNL.  Denies pain at this time.  PIV removed with catheter intact and w/o S&S of infection noted.  Discharge instructions reviewed with pt.  Pt verbalized understanding.  Informed pt that he is to pickup prescription for Prednisone at [facility] today.  Pt/neighbor verbalized understanding.

## 2015-11-06 NOTE — Discharge Summary (Signed)
Physician Discharge Summary  Matthew Molina [id number] DOB: 1951-02-21 DOA: 11/05/2015  PCP: [name], MD  Admit date: 11/05/2015 Discharge date: 11/06/2015  Time spent: 45 minutes  Recommendations for Outpatient Follow-up:  -Will be discharged home today. -Advised to follow up with PCP in 2 weeks.   Discharge Diagnoses:  Active Problems:   COPD (chronic obstructive pulmonary disease) ([place])   Diabetes ([place])   Hypertension   COPD exacerbation ([place])   Discharge Condition: Stable and improved  Filed Weights   11/05/15 1506 11/05/15 2040  Weight: 113.399 kg (250 lb) 113.898 kg (251 lb 1.6 oz)    History of present illness:  As per Dr. Anastasio Champion 11/18: Matthew Molina is a 64 y.o. male  This is a 64 year old man who has a history of COPD and became short of breath that started this morning. He has been wheezing associated with a cough productive of white sputum. He denies any fever. He uses oxygen at home. Denies any chest pain or palpitations. He has not improved significantly in the emergency room and he requires admission.  Hospital Course:   COPD with Acute Exacerbation -Rapidly improved clinically. -Patient wants to be discharged home today. -Will send home on a prednisone taper. -See no need for antibiotics. -Continue symbicort, spiriva and rescue albuterol  Rest of chronic medical conditions have been stable and home medications have not been changed.  Procedures:  None   Consultations:  None  Discharge Instructions  Discharge Instructions    Diet - low sodium heart healthy    Complete by:  As directed      Increase activity slowly    Complete by:  As directed             Medication List    TAKE these medications        amLODipine 10 MG tablet  Commonly known as:  NORVASC  Take 10 mg by mouth daily.     glipiZIDE 10 MG 24 hr tablet  Commonly known as:  GLUCOTROL XL  Take 10 mg by mouth daily.     ipratropium-albuterol 0.5-2.5 (3)  MG/3ML Soln  Commonly known as:  DUONEB  Inhale 3 mLs into the lungs every 4 (four) hours as needed (for shortness of breath/wheezing).     metFORMIN 1000 MG tablet  Commonly known as:  GLUCOPHAGE  Take 1,000 mg by mouth 2 (two) times daily.     pravastatin 40 MG tablet  Commonly known as:  PRAVACHOL  Take 40 mg by mouth daily.     predniSONE 10 MG tablet  Commonly known as:  DELTASONE  Take 1 tablet (10 mg total) by mouth daily with breakfast.     risperiDONE 1 MG tablet  Commonly known as:  RISPERDAL  Take 1 mg by mouth at bedtime.     risperidone 4 MG tablet  Commonly known as:  RISPERDAL  Take 4 mg by mouth at bedtime.     SYMBICORT 80-4.5 MCG/ACT inhaler  Generic drug:  budesonide-formoterol  Inhale 1 puff into the lungs 2 (two) times daily.     triamterene-hydrochlorothiazide 37.5-25 MG tablet  Commonly known as:  MAXZIDE-25  Take 1 tablet by mouth daily.     valsartan 320 MG tablet  Commonly known as:  DIOVAN  Take 320 mg by mouth daily.     VENTOLIN HFA 108 (90 BASE) MCG/ACT inhaler  Generic drug:  albuterol  Inhale 1-2 puffs into the lungs every 6 (six) hours as needed for wheezing  or shortness of breath.     ZETIA 10 MG tablet  Generic drug:  ezetimibe  Take 10 mg by mouth daily.       Allergies  Allergen Reactions  . Wellbutrin [Bupropion]   . Zoloft [Sertraline Hcl]        Follow-up Information    Follow up with [name], MD. Schedule an appointment as soon as possible for a visit in 2 weeks.   Specialty:  Family Medicine   Contact information:   [address] [place] [address] [phone]        The results of significant diagnostics from this hospitalization (including imaging, microbiology, ancillary and laboratory) are listed below for reference.    Significant Diagnostic Studies: Dg Chest Portable 1 View  11/05/2015  CLINICAL DATA:  Short of breath. History of COPD. Also constantly coughing. EXAM: PORTABLE  CHEST 1 VIEW COMPARISON:  None. FINDINGS: Cardiac silhouette normal in size and configuration. No mediastinal or hilar masses or evidence of adenopathy. Clear lungs.  No pleural effusion or pneumothorax. Bony thorax is intact. IMPRESSION: No active disease. Electronically Signed   By: Lajean Manes M.D.   On: 11/05/2015 15:35    Microbiology: No results found for this or any previous visit (from the past 240 hour(s)).   Labs: Basic Metabolic Panel:  Recent Labs Lab 11/05/15 1540 11/06/15 0452  NA 134* 130*  K 3.7 4.2  CL 98* 98*  CO2 22 18*  GLUCOSE 152* 295*  BUN 18 24*  CREATININE 1.74* 1.66*  CALCIUM 10.0 9.9   Liver Function Tests:  Recent Labs Lab 11/05/15 1540 11/06/15 0452  AST 31 37  ALT 30 30  ALKPHOS 45 43  BILITOT 0.4 0.6  PROT 7.8 8.2*  ALBUMIN 4.4 4.3   No results for input(s): LIPASE, AMYLASE in the last 168 hours. No results for input(s): AMMONIA in the last 168 hours. CBC:  Recent Labs Lab 11/05/15 1540 11/06/15 0452  WBC 9.0 12.4*  NEUTROABS 4.9  --   HGB 12.3* 12.3*  HCT 37.5* 37.5*  MCV 88.7 88.4  PLT 268 269   Cardiac Enzymes: No results for input(s): CKTOTAL, CKMB, CKMBINDEX, TROPONINI in the last 168 hours. BNP: BNP (last 3 results) No results for input(s): BNP in the last 8760 hours.  ProBNP (last 3 results) No results for input(s): PROBNP in the last 8760 hours.  CBG:  Recent Labs Lab 11/05/15 2043 11/06/15 0801 11/06/15 1149  GLUCAP 195* 277* 339*       Signed:  Lelon Frohlich  [facility] Pager: [phone] 11/06/2015, 3:06 PM

## 2015-11-07 ENCOUNTER — Encounter (HOSPITAL_COMMUNITY): Payer: Self-pay | Admitting: Emergency Medicine

## 2015-11-17 ENCOUNTER — Ambulatory Visit (INDEPENDENT_AMBULATORY_CARE_PROVIDER_SITE_OTHER): Payer: Commercial Managed Care - HMO | Admitting: Podiatry

## 2015-11-17 ENCOUNTER — Encounter: Payer: Self-pay | Admitting: Podiatry

## 2015-11-17 VITALS — BP 112/72 | HR 123 | Resp 16

## 2015-11-17 DIAGNOSIS — Q828 Other specified congenital malformations of skin: Secondary | ICD-10-CM | POA: Diagnosis not present

## 2015-11-17 DIAGNOSIS — B351 Tinea unguium: Secondary | ICD-10-CM | POA: Diagnosis not present

## 2015-11-17 DIAGNOSIS — E119 Type 2 diabetes mellitus without complications: Secondary | ICD-10-CM

## 2015-11-17 DIAGNOSIS — M79676 Pain in unspecified toe(s): Secondary | ICD-10-CM

## 2015-11-17 NOTE — Progress Notes (Deleted)
   Subjective:    Patient ID: Matthew Molina, male    DOB: [birth year], 64 y.o.   MRN: [id number]  HPI  The patient is here today for B/L toenail trim and B/L callous trim which hurts when walking on them.  Review of Systems  All other systems reviewed and are negative.      Objective:   Physical Exam        Assessment & Plan:

## 2015-11-17 NOTE — Progress Notes (Addendum)
Patient ID: Matthew Molina, male   DOB: Jun 09, 1951, 64 y.o.   MRN: [id number] Complaint:  Visit Type: Patient returns to my office for  preventative foot care services. Complaint: Patient states" my nails have grown long and thick and become painful to walk and wear shoes" Patient has been diagnosed with DM with no foot complications. The patient presents for preventative foot care services. No changes to ROS He relates two painful callus under both feet.  Also has history of redness and swelling and stillness to right big toe which has marked;ly improved.  Podiatric Exam: Vascular: dorsalis pedis and posterior tibial pulses are palpable bilateral. Capillary return is immediate. Temperature gradient is WNL. Skin turgor WNL  Sensorium: Normal Semmes Weinstein monofilament test. Normal tactile sensation bilaterally. Nail Exam: Pt has thick disfigured discolored nails with subungual debris noted bilateral entire nail hallux through fifth toenails Ulcer Exam: There is no evidence of ulcer or pre-ulcerative changes or infection. Orthopedic Exam: Muscle tone and strength are WNL. No limitations in general ROM. No crepitus or effusions noted. Foot type and digits show no abnormalities. Bony prominences are unremarkable.Mild brownish discoloration 1st MPJ right foot. Skin:  Porokeratosis sub 5th metabase both feet.. No infection or ulcers  Diagnosis:  Onychomycosis, , Pain in right toe, pain in left toes, Porokeratosis  Treatment & Plan Procedures and Treatment: Consent by patient was obtained for treatment procedures. The patient understood the discussion of treatment and procedures well. All questions were answered thoroughly reviewed. Debridement of mycotic and hypertrophic toenails, 1 through 5 bilateral and clearing of subungual debris. No ulceration, no infection noted. Debride porokeratosis. Return Visit-Office Procedure: Patient instructed to return to the office for a follow up visit 3 months for  continued evaluation and treatment.    Gardiner Barefoot DPM

## 2015-11-17 NOTE — Addendum Note (Signed)
Addended by: Gardiner Barefoot on: 11/17/2015 02:45 PM   Modules accepted: Level of Service

## 2015-11-20 ENCOUNTER — Inpatient Hospital Stay (HOSPITAL_COMMUNITY)
Admission: EM | Admit: 2015-11-20 | Discharge: 2015-11-22 | DRG: 191 | Disposition: A | Discharge status: Home / Self Care | Payer: Commercial Managed Care - HMO | Attending: Internal Medicine | Admitting: Internal Medicine

## 2015-11-20 ENCOUNTER — Encounter (HOSPITAL_COMMUNITY): Payer: Self-pay

## 2015-11-20 DIAGNOSIS — Z79899 Other long term (current) drug therapy: Secondary | ICD-10-CM

## 2015-11-20 DIAGNOSIS — F411 Generalized anxiety disorder: Secondary | ICD-10-CM | POA: Diagnosis present

## 2015-11-20 DIAGNOSIS — E871 Hypo-osmolality and hyponatremia: Secondary | ICD-10-CM | POA: Diagnosis present

## 2015-11-20 DIAGNOSIS — Z833 Family history of diabetes mellitus: Secondary | ICD-10-CM

## 2015-11-20 DIAGNOSIS — Z7984 Long term (current) use of oral hypoglycemic drugs: Secondary | ICD-10-CM

## 2015-11-20 DIAGNOSIS — Z841 Family history of disorders of kidney and ureter: Secondary | ICD-10-CM

## 2015-11-20 DIAGNOSIS — Z7982 Long term (current) use of aspirin: Secondary | ICD-10-CM

## 2015-11-20 DIAGNOSIS — Z9981 Dependence on supplemental oxygen: Secondary | ICD-10-CM

## 2015-11-20 DIAGNOSIS — E785 Hyperlipidemia, unspecified: Secondary | ICD-10-CM | POA: Diagnosis present

## 2015-11-20 DIAGNOSIS — I10 Essential (primary) hypertension: Secondary | ICD-10-CM | POA: Diagnosis present

## 2015-11-20 DIAGNOSIS — E119 Type 2 diabetes mellitus without complications: Secondary | ICD-10-CM

## 2015-11-20 DIAGNOSIS — R0602 Shortness of breath: Secondary | ICD-10-CM | POA: Diagnosis not present

## 2015-11-20 DIAGNOSIS — M1990 Unspecified osteoarthritis, unspecified site: Secondary | ICD-10-CM | POA: Diagnosis present

## 2015-11-20 DIAGNOSIS — E1122 Type 2 diabetes mellitus with diabetic chronic kidney disease: Secondary | ICD-10-CM | POA: Diagnosis present

## 2015-11-20 DIAGNOSIS — Z7952 Long term (current) use of systemic steroids: Secondary | ICD-10-CM

## 2015-11-20 DIAGNOSIS — N189 Chronic kidney disease, unspecified: Secondary | ICD-10-CM | POA: Diagnosis present

## 2015-11-20 DIAGNOSIS — F209 Schizophrenia, unspecified: Secondary | ICD-10-CM | POA: Diagnosis present

## 2015-11-20 DIAGNOSIS — I129 Hypertensive chronic kidney disease with stage 1 through stage 4 chronic kidney disease, or unspecified chronic kidney disease: Secondary | ICD-10-CM | POA: Diagnosis present

## 2015-11-20 DIAGNOSIS — Z7951 Long term (current) use of inhaled steroids: Secondary | ICD-10-CM

## 2015-11-20 DIAGNOSIS — Z8249 Family history of ischemic heart disease and other diseases of the circulatory system: Secondary | ICD-10-CM

## 2015-11-20 DIAGNOSIS — Z823 Family history of stroke: Secondary | ICD-10-CM

## 2015-11-20 DIAGNOSIS — J441 Chronic obstructive pulmonary disease with (acute) exacerbation: Principal | ICD-10-CM | POA: Diagnosis present

## 2015-11-20 DIAGNOSIS — E7800 Pure hypercholesterolemia, unspecified: Secondary | ICD-10-CM | POA: Diagnosis present

## 2015-11-20 DIAGNOSIS — Z87891 Personal history of nicotine dependence: Secondary | ICD-10-CM

## 2015-11-20 MED ORDER — ALBUTEROL SULFATE (2.5 MG/3ML) 0.083% IN NEBU
2.5000 mg | INHALATION_SOLUTION | Freq: Once | RESPIRATORY_TRACT | Status: AC
  Administered 2015-11-20: 2.5 mg via RESPIRATORY_TRACT
  Filled 2015-11-20: qty 3

## 2015-11-20 NOTE — ED Notes (Signed)
Pt reports dry cough and sob x 3 days, denies pain

## 2015-11-21 ENCOUNTER — Emergency Department (HOSPITAL_COMMUNITY): Payer: Commercial Managed Care - HMO

## 2015-11-21 ENCOUNTER — Encounter (HOSPITAL_COMMUNITY): Payer: Self-pay | Admitting: General Practice

## 2015-11-21 DIAGNOSIS — E118 Type 2 diabetes mellitus with unspecified complications: Secondary | ICD-10-CM | POA: Diagnosis not present

## 2015-11-21 DIAGNOSIS — E7800 Pure hypercholesterolemia, unspecified: Secondary | ICD-10-CM | POA: Diagnosis present

## 2015-11-21 DIAGNOSIS — I10 Essential (primary) hypertension: Secondary | ICD-10-CM | POA: Diagnosis not present

## 2015-11-21 DIAGNOSIS — M1990 Unspecified osteoarthritis, unspecified site: Secondary | ICD-10-CM | POA: Diagnosis present

## 2015-11-21 DIAGNOSIS — Z823 Family history of stroke: Secondary | ICD-10-CM | POA: Diagnosis not present

## 2015-11-21 DIAGNOSIS — N183 Chronic kidney disease, stage 3 (moderate): Secondary | ICD-10-CM

## 2015-11-21 DIAGNOSIS — N189 Chronic kidney disease, unspecified: Secondary | ICD-10-CM

## 2015-11-21 DIAGNOSIS — E871 Hypo-osmolality and hyponatremia: Secondary | ICD-10-CM | POA: Diagnosis present

## 2015-11-21 DIAGNOSIS — Z833 Family history of diabetes mellitus: Secondary | ICD-10-CM | POA: Diagnosis not present

## 2015-11-21 DIAGNOSIS — Z8249 Family history of ischemic heart disease and other diseases of the circulatory system: Secondary | ICD-10-CM | POA: Diagnosis not present

## 2015-11-21 DIAGNOSIS — Z87891 Personal history of nicotine dependence: Secondary | ICD-10-CM | POA: Diagnosis not present

## 2015-11-21 DIAGNOSIS — Z9981 Dependence on supplemental oxygen: Secondary | ICD-10-CM | POA: Diagnosis not present

## 2015-11-21 DIAGNOSIS — Z7951 Long term (current) use of inhaled steroids: Secondary | ICD-10-CM | POA: Diagnosis not present

## 2015-11-21 DIAGNOSIS — Z7984 Long term (current) use of oral hypoglycemic drugs: Secondary | ICD-10-CM | POA: Diagnosis not present

## 2015-11-21 DIAGNOSIS — Z7952 Long term (current) use of systemic steroids: Secondary | ICD-10-CM | POA: Diagnosis not present

## 2015-11-21 DIAGNOSIS — J441 Chronic obstructive pulmonary disease with (acute) exacerbation: Principal | ICD-10-CM

## 2015-11-21 DIAGNOSIS — Z841 Family history of disorders of kidney and ureter: Secondary | ICD-10-CM | POA: Diagnosis not present

## 2015-11-21 DIAGNOSIS — Z7982 Long term (current) use of aspirin: Secondary | ICD-10-CM | POA: Diagnosis not present

## 2015-11-21 DIAGNOSIS — F209 Schizophrenia, unspecified: Secondary | ICD-10-CM | POA: Diagnosis present

## 2015-11-21 DIAGNOSIS — E785 Hyperlipidemia, unspecified: Secondary | ICD-10-CM | POA: Diagnosis present

## 2015-11-21 DIAGNOSIS — R0602 Shortness of breath: Secondary | ICD-10-CM | POA: Diagnosis present

## 2015-11-21 DIAGNOSIS — I129 Hypertensive chronic kidney disease with stage 1 through stage 4 chronic kidney disease, or unspecified chronic kidney disease: Secondary | ICD-10-CM | POA: Diagnosis present

## 2015-11-21 DIAGNOSIS — E1122 Type 2 diabetes mellitus with diabetic chronic kidney disease: Secondary | ICD-10-CM | POA: Diagnosis present

## 2015-11-21 DIAGNOSIS — Z79899 Other long term (current) drug therapy: Secondary | ICD-10-CM | POA: Diagnosis not present

## 2015-11-21 DIAGNOSIS — F411 Generalized anxiety disorder: Secondary | ICD-10-CM | POA: Diagnosis present

## 2015-11-21 LAB — BASIC METABOLIC PANEL
Anion gap: 9 (ref 5–15)
BUN: 17 mg/dL (ref 6–20)
CO2: 23 mmol/L (ref 22–32)
Calcium: 9.6 mg/dL (ref 8.9–10.3)
Chloride: 95 mmol/L — ABNORMAL LOW (ref 101–111)
Creatinine, Ser: 1.59 mg/dL — ABNORMAL HIGH (ref 0.61–1.24)
GFR calc Af Amer: 51 mL/min — ABNORMAL LOW (ref >60)
GFR calc non Af Amer: 44 mL/min — ABNORMAL LOW (ref >60)
Glucose, Bld: 129 mg/dL — ABNORMAL HIGH (ref 65–99)
Potassium: 4 mmol/L (ref 3.5–5.1)
Sodium: 127 mmol/L — ABNORMAL LOW (ref 135–145)

## 2015-11-21 LAB — GLUCOSE, CAPILLARY
Glucose-Capillary: 331 mg/dL — ABNORMAL HIGH (ref 65–99)
Glucose-Capillary: 206 mg/dL — ABNORMAL HIGH (ref 65–99)
Glucose-Capillary: 269 mg/dL — ABNORMAL HIGH (ref 65–99)

## 2015-11-21 LAB — I-STAT CHEM 8, ED
BUN: 15 mg/dL (ref 6–20)
Calcium, Ion: 1.23 mmol/L (ref 1.13–1.30)
Chloride: 94 mmol/L — ABNORMAL LOW (ref 101–111)
Creatinine, Ser: 1.6 mg/dL — ABNORMAL HIGH (ref 0.61–1.24)
Glucose, Bld: 80 mg/dL (ref 65–99)
HCT: 40 % (ref 39.0–52.0)
Hemoglobin: 13.6 g/dL (ref 13.0–17.0)
Potassium: 3.9 mmol/L (ref 3.5–5.1)
Sodium: 130 mmol/L — ABNORMAL LOW (ref 135–145)
TCO2: 21 mmol/L (ref 0–100)

## 2015-11-21 LAB — CBC
HCT: 35.8 % — ABNORMAL LOW (ref 39.0–52.0)
Hemoglobin: 11.9 g/dL — ABNORMAL LOW (ref 13.0–17.0)
MCH: 29.2 pg (ref 26.0–34.0)
MCHC: 33.2 g/dL (ref 30.0–36.0)
MCV: 87.7 fL (ref 78.0–100.0)
Platelets: 227 10*3/uL (ref 150–400)
RBC: 4.08 MIL/uL — ABNORMAL LOW (ref 4.22–5.81)
RDW: 15.2 % (ref 11.5–15.5)
WBC: 10.2 10*3/uL (ref 4.0–10.5)

## 2015-11-21 MED ORDER — SODIUM CHLORIDE 0.9 % IJ SOLN
3.0000 mL | Freq: Two times a day (BID) | INTRAMUSCULAR | Status: DC
  Administered 2015-11-22: 3 mL via INTRAVENOUS

## 2015-11-21 MED ORDER — BENZONATATE 100 MG PO CAPS
100.0000 mg | ORAL_CAPSULE | Freq: Once | ORAL | Status: AC
  Administered 2015-11-21: 100 mg via ORAL
  Filled 2015-11-21: qty 1

## 2015-11-21 MED ORDER — AZITHROMYCIN 250 MG PO TABS
250.0000 mg | ORAL_TABLET | Freq: Every day | ORAL | Status: DC
  Administered 2015-11-22: 250 mg via ORAL
  Filled 2015-11-21: qty 1

## 2015-11-21 MED ORDER — METHYLPREDNISOLONE SODIUM SUCC 125 MG IJ SOLR
80.0000 mg | Freq: Three times a day (TID) | INTRAMUSCULAR | Status: DC
  Administered 2015-11-21 – 2015-11-22 (×3): 80 mg via INTRAVENOUS
  Filled 2015-11-21 (×3): qty 2

## 2015-11-21 MED ORDER — IPRATROPIUM-ALBUTEROL 0.5-2.5 (3) MG/3ML IN SOLN
3.0000 mL | Freq: Once | RESPIRATORY_TRACT | Status: AC
  Administered 2015-11-21: 3 mL via RESPIRATORY_TRACT
  Filled 2015-11-21: qty 3

## 2015-11-21 MED ORDER — HYDROCODONE-HOMATROPINE 5-1.5 MG/5ML PO SYRP
5.0000 mL | ORAL_SOLUTION | Freq: Four times a day (QID) | ORAL | Status: DC | PRN
  Administered 2015-11-21 – 2015-11-22 (×3): 5 mL via ORAL
  Filled 2015-11-21 (×3): qty 5

## 2015-11-21 MED ORDER — SODIUM CHLORIDE 0.9 % IV SOLN
INTRAVENOUS | Status: DC
  Administered 2015-11-21: 15:00:00 via INTRAVENOUS

## 2015-11-21 MED ORDER — IPRATROPIUM BROMIDE 0.02 % IN SOLN: 0.5000 mg | Freq: Four times a day (QID) | RESPIRATORY_TRACT | Status: DC

## 2015-11-21 MED ORDER — METHYLPREDNISOLONE SODIUM SUCC 125 MG IJ SOLR
80.0000 mg | Freq: Two times a day (BID) | INTRAMUSCULAR | Status: DC
Start: 2015-11-21 — End: 2015-11-21
  Administered 2015-11-21: 80 mg via INTRAVENOUS
  Filled 2015-11-21: qty 2

## 2015-11-21 MED ORDER — GUAIFENESIN ER 600 MG PO TB12
1200.0000 mg | ORAL_TABLET | Freq: Two times a day (BID) | ORAL | Status: DC
  Administered 2015-11-21 – 2015-11-22 (×3): 1200 mg via ORAL
  Filled 2015-11-21 (×4): qty 2

## 2015-11-21 MED ORDER — GUAIFENESIN-DM 100-10 MG/5ML PO SYRP
5.0000 mL | ORAL_SOLUTION | ORAL | Status: DC | PRN
  Administered 2015-11-21 (×3): 5 mL via ORAL
  Filled 2015-11-21 (×4): qty 5

## 2015-11-21 MED ORDER — ALBUTEROL SULFATE (2.5 MG/3ML) 0.083% IN NEBU: 2.5000 mg | INHALATION_SOLUTION | Freq: Four times a day (QID) | RESPIRATORY_TRACT | Status: DC

## 2015-11-21 MED ORDER — IPRATROPIUM-ALBUTEROL 0.5-2.5 (3) MG/3ML IN SOLN
3.0000 mL | RESPIRATORY_TRACT | Status: DC
  Administered 2015-11-21 – 2015-11-22 (×6): 3 mL via RESPIRATORY_TRACT
  Filled 2015-11-21 (×6): qty 3

## 2015-11-21 MED ORDER — PREDNISONE 10 MG PO TABS
60.0000 mg | ORAL_TABLET | Freq: Once | ORAL | Status: AC
  Administered 2015-11-21: 60 mg via ORAL
  Filled 2015-11-21: qty 1

## 2015-11-21 MED ORDER — ALBUTEROL (5 MG/ML) CONTINUOUS INHALATION SOLN
10.0000 mg/h | INHALATION_SOLUTION | Freq: Once | RESPIRATORY_TRACT | Status: AC
  Administered 2015-11-21: 10 mg/h via RESPIRATORY_TRACT
  Filled 2015-11-21: qty 20

## 2015-11-21 MED ORDER — AZITHROMYCIN 250 MG PO TABS
500.0000 mg | ORAL_TABLET | Freq: Every day | ORAL | Status: AC
  Administered 2015-11-21: 500 mg via ORAL
  Filled 2015-11-21: qty 2

## 2015-11-21 MED ORDER — IPRATROPIUM-ALBUTEROL 0.5-2.5 (3) MG/3ML IN SOLN
3.0000 mL | Freq: Four times a day (QID) | RESPIRATORY_TRACT | Status: DC
  Administered 2015-11-21 (×2): 3 mL via RESPIRATORY_TRACT
  Filled 2015-11-21: qty 3

## 2015-11-21 MED ORDER — SODIUM CHLORIDE 0.9 % IJ SOLN: 3.0000 mL | INTRAMUSCULAR | Status: DC | PRN

## 2015-11-21 MED ORDER — ALBUTEROL SULFATE (2.5 MG/3ML) 0.083% IN NEBU: 2.5000 mg | INHALATION_SOLUTION | RESPIRATORY_TRACT | Status: DC | PRN

## 2015-11-21 MED ORDER — SODIUM CHLORIDE 0.9 % IV SOLN
250.0000 mL | INTRAVENOUS | Status: DC | PRN
  Administered 2015-11-21: 250 mL via INTRAVENOUS

## 2015-11-21 MED ORDER — INSULIN ASPART 100 UNIT/ML ~~LOC~~ SOLN
0.0000 [IU] | Freq: Three times a day (TID) | SUBCUTANEOUS | Status: DC
  Administered 2015-11-21 (×2): 3 [IU] via SUBCUTANEOUS
  Administered 2015-11-21 – 2015-11-22 (×3): 7 [IU] via SUBCUTANEOUS

## 2015-11-21 NOTE — Plan of Care (Signed)
Problem: ICU Phase Progression Outcomes Goal: Dyspnea controlled at rest Outcome: Not Progressing Pt with persistent cough aggravated with exertion

## 2015-11-21 NOTE — ED Notes (Signed)
Nurse unable to take report at this time.

## 2015-11-21 NOTE — Progress Notes (Signed)
Patient admitted to the hospital by Dr. Shanon Brow earlier this morning.  Patient seen and examined.   He has been admitted to the hospital for a COPD exacerbation. He continues to feel short of breath, is wheezing and coughing. He is requiring supplemental oxygen. Will continue on IV steroids and bronchodilators. Will also continue azithromycin. Anticipate discharge in the next 24 hours if continues to improve.  Matthew Molina

## 2015-11-21 NOTE — H&P (Signed)
PCP:   [name], MD   Chief Complaint:  sob  HPI: 64 yo male h/o copd comes in with several days of coughing, wheezing and more sob than normal not improved with his home albuterol treatments.  No fevers.  No swelling in legs.  No chest pain.  Vitals all normal.  Referred for admission for copde.  Review of Systems:  Positive and negative as per HPI otherwise all other systems are negative  Past Medical History: Past Medical History  Diagnosis Date  . Nicotine addiction   . Schizophrenia ([place])   . Hyperlipidemia   . Obesity   . Diabetes mellitus   . Arthritis   . Acute respiratory failure with hypoxia ([place])   . Acute kidney injury ([place])   . Multiple lung nodules on CT 04/02/2015  . Depression   . COPD (chronic obstructive pulmonary disease) ([place])   . Diabetes mellitus without complication ([place])   . Hypertension   . Hypercholesterolemia    Past Surgical History  Procedure Laterality Date  . Colonoscopy    . Colonoscopy N/A 04/01/2013    Procedure: COLONOSCOPY;  Surgeon: Danie Binder, MD;  Location: [facility];  Service: Endoscopy;  Laterality: N/A;  10:00 AM-moved to [place] notified pt  . Cataract extraction w/phaco Left 11/20/2013    Procedure: CATARACT EXTRACTION PHACO AND INTRAOCULAR LENS PLACEMENT (IOC);  Surgeon: Tonny Branch, MD;  Location: [facility] ORS;  Service: Ophthalmology;  Laterality: Left;  CDE:10.26  . Cataract extraction w/phaco Right 12/08/2013    Procedure: RIGHT EYE CATARACT EXTRACTION PHACO AND INTRAOCULAR LENS PLACEMENT ;  Surgeon: Tonny Branch, MD;  Location: [facility] ORS;  Service: Ophthalmology;  Laterality: Right;  CDE 12.38  . Eye surgery Left 11/2013    cataract extraction  . Shoulder surgery      Medications: Prior to Admission medications   Medication Sig Start Date End Date Taking? Authorizing Provider  albuterol (PROVENTIL HFA;VENTOLIN HFA) 108 (90 BASE) MCG/ACT inhaler Inhale 2 puffs into the lungs every 6 (six) hours as needed for  wheezing or shortness of breath. 07/29/15  Yes Ezequiel Essex, MD  amLODipine (NORVASC) 10 MG tablet TAKE (1) TABLET BY MOUTH DAILY. Patient taking differently: Take 10 mg by mouth daily.  07/26/15  Yes Lezlie Octave Black, NP  budesonide-formoterol (SYMBICORT) 80-4.5 MCG/ACT inhaler INHALE 2 PUFFS INTO LUNGS TWICE DAILY. Patient taking differently: Inhale 2 puffs into the lungs 2 (two) times daily.  08/30/15  Yes Fayrene Helper, MD  ezetimibe (ZETIA) 10 MG tablet Take 1 tablet (10 mg total) by mouth daily. 07/26/15  Yes Lezlie Octave Black, NP  glipiZIDE (GLUCOTROL XL) 10 MG 24 hr tablet Take 1 tablet (10 mg total) by mouth daily. 07/26/15  Yes Lezlie Octave Black, NP  ipratropium-albuterol (DUONEB) 0.5-2.5 (3) MG/3ML SOLN Take 3 mLs by nebulization every 4 (four) hours as needed. Patient taking differently: Take 3 mLs by nebulization every 4 (four) hours as needed (for shortness of breath).  08/30/15  Yes Fayrene Helper, MD  metFORMIN (GLUCOPHAGE) 1000 MG tablet TAKE 1 TABLET BY MOUTH TWICE DAILY WITH FOOD FOR DIABETES. 07/26/15  Yes Lezlie Octave Black, NP  pravastatin (PRAVACHOL) 40 MG tablet TAKE (1) TABLET BY MOUTH AT BEDTIME FOR CHOLESTEROL. Patient taking differently: Take 40 mg by mouth at bedtime.  07/26/15  Yes Radene Gunning, NP  predniSONE (DELTASONE) 10 MG tablet Take 1 tablet (10 mg total) by mouth daily with breakfast. 11/06/15  Yes Estela Leonie Green, MD  risperidone (RISPERDAL) 4 MG tablet Take 1 tablet (4 mg total) by mouth at bedtime. Patient taking differently: Take 4 mg by mouth at bedtime. Takes 5mg  total every day at bedtime 08/27/15  Yes Cloria Spring, MD  triamterene-hydrochlorothiazide (MAXZIDE-25) 37.5-25 MG per tablet TAKE (1) TABLET BY MOUTH ONCE DAILY. 07/26/15  Yes Lezlie Octave Black, NP  valsartan (DIOVAN) 320 MG tablet Take 1 tablet (320 mg total) by mouth daily. 07/26/15  Yes Lezlie Octave Black, NP  amLODipine (NORVASC) 10 MG tablet Take 10 mg by mouth daily. 10/26/15   Historical Provider, MD   aspirin EC 81 MG tablet Take 81 mg by mouth daily.    Historical Provider, MD  busPIRone (BUSPAR) 15 MG tablet Take 1 tablet (15 mg total) by mouth 2 (two) times daily. 08/27/15   Cloria Spring, MD  glipiZIDE (GLUCOTROL XL) 10 MG 24 hr tablet Take 10 mg by mouth daily. 10/26/15   Historical Provider, MD  ipratropium-albuterol (DUONEB) 0.5-2.5 (3) MG/3ML SOLN Inhale 3 mLs into the lungs every 4 (four) hours as needed (for shortness of breath/wheezing).  08/30/15   Historical Provider, MD  loratadine (ALLERGY RELIEF) 10 MG tablet Take 10-20 mg by mouth daily as needed for allergies.    Historical Provider, MD  metFORMIN (GLUCOPHAGE) 1000 MG tablet Take 1,000 mg by mouth 2 (two) times daily. 10/26/15   Historical Provider, MD  pravastatin (PRAVACHOL) 40 MG tablet Take 40 mg by mouth daily. 10/26/15   Historical Provider, MD  predniSONE (DELTASONE) 20 MG tablet Take 3 tablets (60 mg total) by mouth daily before breakfast. 08/21/15   Orvan Falconer, MD  predniSONE (STERAPRED UNI-PAK 21 TAB) 10 MG (21) TBPK tablet Take 1 tablet (10 mg total) by mouth daily. Use as directed 08/31/15   Fayrene Helper, MD  risperiDONE (RISPERDAL) 1 MG tablet Take 1 mg by mouth at bedtime. Takes 5mg  total every day at bedtime 09/20/15   Historical Provider, MD  risperiDONE (RISPERDAL) 1 MG tablet Take 1 mg by mouth at bedtime. 09/20/15   Historical Provider, MD  risperidone (RISPERDAL) 4 MG tablet Take 4 mg by mouth at bedtime. 10/26/15   Historical Provider, MD  SPIRIVA HANDIHALER 18 MCG inhalation capsule INHALE 1 CAPSULE AS DIRECTED ONCE A DAY. 02/22/15   Fayrene Helper, MD  SYMBICORT 80-4.5 MCG/ACT inhaler Inhale 1 puff into the lungs 2 (two) times daily. 10/26/15   Historical Provider, MD  triamterene-hydrochlorothiazide (MAXZIDE-25) 37.5-25 MG tablet Take 1 tablet by mouth daily. 10/26/15   Historical Provider, MD  valsartan (DIOVAN) 320 MG tablet Take 320 mg by mouth daily. 10/26/15   Historical Provider, MD  VENTOLIN HFA 108 (90  BASE) MCG/ACT inhaler Inhale 1-2 puffs into the lungs every 6 (six) hours as needed for wheezing or shortness of breath.  11/03/15   Historical Provider, MD  ZETIA 10 MG tablet Take 10 mg by mouth daily. 10/26/15   Historical Provider, MD    Allergies:   Allergies  Allergen Reactions  . Sertraline Hcl     Stomach upset/pain  . Wellbutrin [Bupropion] Other (See Comments)    Makes stomach hurt  . Wellbutrin [Bupropion]   . Zoloft [Sertraline Hcl]     Social History:  reports that he has quit smoking. He does not have any smokeless tobacco history on file. He reports that he does not drink alcohol or use illicit drugs.  Family History: Family History  Problem Relation Age of Onset  . Diabetes Mother   .  Hypertension Mother   . Stroke Mother   . Diabetes Sister   . Heart disease Sister   . Kidney disease Father     Physical Exam: Filed Vitals:   11/21/15 0200 11/21/15 0230 11/21/15 0300 11/21/15 0330  BP: 114/69 112/72 99/60   Pulse: 92 90  96  Temp:      TempSrc:      Resp: 27 22  21   Height:      Weight:      SpO2: 94% 93%  93%   General appearance: alert, cooperative and no distress Head: Normocephalic, without obvious abnormality, atraumatic Eyes: negative Nose: Nares normal. Septum midline. Mucosa normal. No drainage or sinus tenderness. Neck: no JVD and supple, symmetrical, trachea midline Lungs: clear to auscultation bilaterally Heart: regular rate and rhythm, S1, S2 normal, no murmur, click, rub or gallop Abdomen: soft, non-tender; bowel sounds normal; no masses,  no organomegaly Extremities: extremities normal, atraumatic, no cyanosis or edema Pulses: 2+ and symmetric Skin: Skin color, texture, turgor normal. No rashes or lesions Neurologic: Grossly normal   Labs on Admission:   Recent Labs  11/21/15 0018  NA 130*  K 3.9  CL 94*  GLUCOSE 80  BUN 15  CREATININE 1.60*    Recent Labs  11/21/15 0018  HGB 13.6  HCT 40.0   Radiological Exams on  Admission:  Dg Chest Portable 1 View  11/21/2015  CLINICAL DATA:  Dry cough and shortness of breath for 3 days. History of hypertension, diabetes, COPD. EXAM: PORTABLE CHEST 1 VIEW COMPARISON:  Chest radiograph November 05, 2015 FINDINGS: Cardiomediastinal silhouette is unremarkable for this low inspiratory portable examination with crowded vasculature markings. The lungs are clear without pleural effusions or focal consolidations. Trachea projects midline and there is no pneumothorax. Included soft tissue planes and osseous structures are non-suspicious. IMPRESSION: No acute cardiopulmonary process. Electronically Signed   By: Elon Alas M.D.   On: 11/21/2015 00:30     Assessment/Plan  64 yo male with acute copde  Principal Problem:   COPD exacerbation (HCC)-  Iv solumedrol at 80mg  iv q 12 hours.  Zpack.  freq nebs.  Cough medicine.    Active Problems:   Schizophrenia, unspecified type ([place])- stable, clarify home meds   GAD (generalized anxiety disorder)- noted   Diabetes ([place])- ssi   Hypertension- stable   CKD (chronic kidney disease)- at baseline, stable  Clarify home meds.  obs on medical bed.  Full code.    Natarsha Hurwitz A 11/21/2015, 3:49 AM

## 2015-11-21 NOTE — ED Notes (Signed)
Report given to [facility] on 300

## 2015-11-21 NOTE — ED Notes (Signed)
Family at bedside. 

## 2015-11-21 NOTE — ED Notes (Signed)
Pt sitting up in chair

## 2015-11-21 NOTE — ED Provider Notes (Signed)
CSN: [id number]     Arrival date & time 11/20/15  2326 History  By signing my name below, I, Altamease Oiler, attest that this documentation has been prepared under the direction and in the presence of Ripley Fraise, MD. Electronically Signed: Altamease Oiler, ED Scribe. 11/21/2015. 12:11 AM   Chief Complaint  Patient presents with  . Shortness of Breath  . Cough    Patient is a 64 y.o. male presenting with shortness of breath and cough. The history is provided by the spouse and the patient.  Shortness of Breath Severity:  Moderate Onset quality:  Gradual Duration:  3 days Timing:  Constant Progression:  Worsening Chronicity:  Recurrent Relieved by:  Nothing Worsened by:  Nothing tried Ineffective treatments: Home nebs. Associated symptoms: cough   Associated symptoms: no abdominal pain, no chest pain, no diaphoresis, no fever, no hemoptysis and no sputum production   Cough Associated symptoms: shortness of breath   Associated symptoms: no chest pain, no diaphoresis and no fever    Matthew Molina is a 64 y.o. male with PMHx of oxygen dependent COPD, DM, HTN, HLD, and hypercholesteremia who presents to the Emergency Department complaining of worsened SOB with gradual onset 3 days ago. At least 4 home nebulizer treatment provided insufficient relief in symptoms at home. Associated symptoms include dry cough and chest tightness. Pt denies fever, chest pain, hemoptysis, and LE swelling.   Past Medical History  Diagnosis Date  . Nicotine addiction   . Schizophrenia ([place])   . Hyperlipidemia   . Obesity   . Diabetes mellitus   . Arthritis   . Acute respiratory failure with hypoxia ([place])   . Acute kidney injury ([place])   . Multiple lung nodules on CT 04/02/2015  . Depression   . COPD (chronic obstructive pulmonary disease) ([place])   . Diabetes mellitus without complication ([place])   . Hypertension   . Hypercholesterolemia    Past Surgical History  Procedure Laterality Date  .  Colonoscopy    . Colonoscopy N/A 04/01/2013    Procedure: COLONOSCOPY;  Surgeon: Danie Binder, MD;  [facility];  Service: Endoscopy;  Laterality: N/A;  10:00 AM-moved to [place] notified pt  . Cataract extraction w/phaco Left 11/20/2013    Procedure: CATARACT EXTRACTION PHACO AND INTRAOCULAR LENS PLACEMENT (IOC);  Surgeon: Tonny Branch, MD;  Location: [facility] ORS;  Service: Ophthalmology;  Laterality: Left;  CDE:10.26  . Cataract extraction w/phaco Right 12/08/2013    Procedure: RIGHT EYE CATARACT EXTRACTION PHACO AND INTRAOCULAR LENS PLACEMENT ;  Surgeon: Tonny Branch, MD;  Location: [facility] ORS;  Service: Ophthalmology;  Laterality: Right;  CDE 12.38  . Eye surgery Left 11/2013    cataract extraction  . Shoulder surgery     Family History  Problem Relation Age of Onset  . Diabetes Mother   . Hypertension Mother   . Stroke Mother   . Diabetes Sister   . Heart disease Sister   . Kidney disease Father    Social History  Substance Use Topics  . Smoking status: Former Smoker -- 1.00 packs/day  . Smokeless tobacco: None  . Alcohol Use: No    Review of Systems  Constitutional: Negative for fever and diaphoresis.  Respiratory: Positive for cough, chest tightness and shortness of breath. Negative for hemoptysis and sputum production.   Cardiovascular: Negative for chest pain and leg swelling.  Gastrointestinal: Negative for abdominal pain.  All other systems reviewed and are negative.     Allergies  Sertraline hcl; Wellbutrin; Wellbutrin; and Zoloft  Home Medications   Prior to Admission medications   Medication Sig Start Date End Date Taking? Authorizing Provider  albuterol (PROVENTIL HFA;VENTOLIN HFA) 108 (90 BASE) MCG/ACT inhaler Inhale 2 puffs into the lungs every 6 (six) hours as needed for wheezing or shortness of breath. 07/29/15  Yes Ezequiel Essex, MD  amLODipine (NORVASC) 10 MG tablet TAKE (1) TABLET BY MOUTH DAILY. Patient taking differently: Take 10 mg by mouth  daily.  07/26/15  Yes Lezlie Octave Black, NP  budesonide-formoterol (SYMBICORT) 80-4.5 MCG/ACT inhaler INHALE 2 PUFFS INTO LUNGS TWICE DAILY. Patient taking differently: Inhale 2 puffs into the lungs 2 (two) times daily.  08/30/15  Yes Fayrene Helper, MD  ezetimibe (ZETIA) 10 MG tablet Take 1 tablet (10 mg total) by mouth daily. 07/26/15  Yes Lezlie Octave Black, NP  glipiZIDE (GLUCOTROL XL) 10 MG 24 hr tablet Take 1 tablet (10 mg total) by mouth daily. 07/26/15  Yes Lezlie Octave Black, NP  ipratropium-albuterol (DUONEB) 0.5-2.5 (3) MG/3ML SOLN Take 3 mLs by nebulization every 4 (four) hours as needed. Patient taking differently: Take 3 mLs by nebulization every 4 (four) hours as needed (for shortness of breath).  08/30/15  Yes Fayrene Helper, MD  metFORMIN (GLUCOPHAGE) 1000 MG tablet TAKE 1 TABLET BY MOUTH TWICE DAILY WITH FOOD FOR DIABETES. 07/26/15  Yes Lezlie Octave Black, NP  pravastatin (PRAVACHOL) 40 MG tablet TAKE (1) TABLET BY MOUTH AT BEDTIME FOR CHOLESTEROL. Patient taking differently: Take 40 mg by mouth at bedtime.  07/26/15  Yes Lezlie Octave Black, NP  predniSONE (DELTASONE) 10 MG tablet Take 1 tablet (10 mg total) by mouth daily with breakfast. 11/06/15  Yes Erline Hau, MD  risperidone (RISPERDAL) 4 MG tablet Take 1 tablet (4 mg total) by mouth at bedtime. Patient taking differently: Take 4 mg by mouth at bedtime. Takes 5mg  total every day at bedtime 08/27/15  Yes Cloria Spring, MD  triamterene-hydrochlorothiazide (MAXZIDE-25) 37.5-25 MG per tablet TAKE (1) TABLET BY MOUTH ONCE DAILY. 07/26/15  Yes Lezlie Octave Black, NP  valsartan (DIOVAN) 320 MG tablet Take 1 tablet (320 mg total) by mouth daily. 07/26/15  Yes Lezlie Octave Black, NP  amLODipine (NORVASC) 10 MG tablet Take 10 mg by mouth daily. 10/26/15   Historical Provider, MD  aspirin EC 81 MG tablet Take 81 mg by mouth daily.    Historical Provider, MD  busPIRone (BUSPAR) 15 MG tablet Take 1 tablet (15 mg total) by mouth 2 (two) times daily. 08/27/15   Cloria Spring, MD  glipiZIDE (GLUCOTROL XL) 10 MG 24 hr tablet Take 10 mg by mouth daily. 10/26/15   Historical Provider, MD  ipratropium-albuterol (DUONEB) 0.5-2.5 (3) MG/3ML SOLN Inhale 3 mLs into the lungs every 4 (four) hours as needed (for shortness of breath/wheezing).  08/30/15   Historical Provider, MD  loratadine (ALLERGY RELIEF) 10 MG tablet Take 10-20 mg by mouth daily as needed for allergies.    Historical Provider, MD  metFORMIN (GLUCOPHAGE) 1000 MG tablet Take 1,000 mg by mouth 2 (two) times daily. 10/26/15   Historical Provider, MD  pravastatin (PRAVACHOL) 40 MG tablet Take 40 mg by mouth daily. 10/26/15   Historical Provider, MD  predniSONE (DELTASONE) 20 MG tablet Take 3 tablets (60 mg total) by mouth daily before breakfast. 08/21/15   Orvan Falconer, MD  predniSONE (STERAPRED UNI-PAK 21 TAB) 10 MG (21) TBPK tablet Take 1 tablet (10 mg total) by mouth daily. Use as  directed 08/31/15   Fayrene Helper, MD  risperiDONE (RISPERDAL) 1 MG tablet Take 1 mg by mouth at bedtime. Takes 5mg  total every day at bedtime 09/20/15   Historical Provider, MD  risperiDONE (RISPERDAL) 1 MG tablet Take 1 mg by mouth at bedtime. 09/20/15   Historical Provider, MD  risperidone (RISPERDAL) 4 MG tablet Take 4 mg by mouth at bedtime. 10/26/15   Historical Provider, MD  SPIRIVA HANDIHALER 18 MCG inhalation capsule INHALE 1 CAPSULE AS DIRECTED ONCE A DAY. 02/22/15   Fayrene Helper, MD  SYMBICORT 80-4.5 MCG/ACT inhaler Inhale 1 puff into the lungs 2 (two) times daily. 10/26/15   Historical Provider, MD  triamterene-hydrochlorothiazide (MAXZIDE-25) 37.5-25 MG tablet Take 1 tablet by mouth daily. 10/26/15   Historical Provider, MD  valsartan (DIOVAN) 320 MG tablet Take 320 mg by mouth daily. 10/26/15   Historical Provider, MD  VENTOLIN HFA 108 (90 BASE) MCG/ACT inhaler Inhale 1-2 puffs into the lungs every 6 (six) hours as needed for wheezing or shortness of breath.  11/03/15   Historical Provider, MD  ZETIA 10 MG tablet Take 10 mg by  mouth daily. 10/26/15   Historical Provider, MD   BP 127/67 mmHg  Pulse 100  Temp(Src) 98.7 F (37.1 C) (Oral)  Resp 18  Ht 6\' 1"  (1.854 m)  Wt 250 lb (113.399 kg)  BMI 32.99 kg/m2  SpO2 97% Physical Exam CONSTITUTIONAL: Well developed/well nourished HEAD: Normocephalic/atraumatic EYES: EOMI/PERRL ENMT: Mucous membranes moist NECK: supple no meningeal signs SPINE/BACK:entire spine nontender CV: S1/S2 noted, no murmurs/rubs/gallops noted LUNGS: Wheezing bilaterally. Pt coughs frequently. ABDOMEN: soft, nontender, no rebound or guarding, bowel sounds noted throughout abdomen GU:no cva tenderness NEURO: Pt is awake/alert/appropriate, moves all extremitiesx4.  No facial droop.   EXTREMITIES: pulses normal/equal, full ROM. No edema noted.  SKIN: warm, color normal PSYCH: no abnormalities of mood noted, alert and oriented to situation  ED Course  Procedures  CRITICAL CARE Performed by: Sharyon Cable Total critical care time: 33 minutes Critical care time was exclusive of separately billable procedures and treating other patients. Critical care was necessary to treat or prevent imminent or life-threatening deterioration. Critical care was time spent personally by me on the following activities: development of treatment plan with patient and/or surrogate as well as nursing, discussions with consultants, evaluation of patient's response to treatment, examination of patient, obtaining history from patient or surrogate, ordering and performing treatments and interventions, ordering and review of laboratory studies, ordering and review of radiographic studies, pulse oximetry and re-evaluation of patient's condition. PATIENT REQUIRED MULTIPLE NEBULIZED TREATMENTS INCLUDING HOUR LONG CONTINUOUS ALBUTEROL AND HE STILL HAS WHEEZING/TACHYPNEA  DIAGNOSTIC STUDIES:  Oxygen Saturation is 97% on 2L, adequate by my interpretation.    COORDINATION OF CARE: 12:07 AM Discussed treatment plan  which includes lab work, CXR, EKG, and breathing treatments with pt at bedside and pt agreed to plan.  2:59 AM PT GIVEN MULTIPLE NEB TREATMENTS STILL WITH WHEEZE/COUGH HE HAS ALREADY FAILED HOME THERAPY AS HE WAS GIVING HIMSELF MULTIPLE TREATMENTS AT HOME WILL ADMIT D/W DR DAVID FOR ADMISSION  Labs Review Labs Reviewed  I-STAT CHEM 8, ED - Abnormal; Notable for the following:    Sodium 130 (*)    Chloride 94 (*)    Creatinine, Ser 1.60 (*)    All other components within normal limits    Imaging Review Dg Chest Portable 1 View  11/21/2015  CLINICAL DATA:  Dry cough and shortness of breath for 3 days. History of hypertension, diabetes,  COPD. EXAM: PORTABLE CHEST 1 VIEW COMPARISON:  Chest radiograph November 05, 2015 FINDINGS: Cardiomediastinal silhouette is unremarkable for this low inspiratory portable examination with crowded vasculature markings. The lungs are clear without pleural effusions or focal consolidations. Trachea projects midline and there is no pneumothorax. Included soft tissue planes and osseous structures are non-suspicious. IMPRESSION: No acute cardiopulmonary process. Electronically Signed   By: Elon Alas M.D.   On: 11/21/2015 00:30   I have personally reviewed and evaluated these images and lab results as part of my medical decision-making.   EKG Interpretation   Date/Time:  Sunday November 21 2015 00:11:04 EST Ventricular Rate:  99 PR Interval:  169 QRS Duration: 82 QT Interval:  342 QTC Calculation: 439 R Axis:   -45 Text Interpretation:  Sinus rhythm Ventricular premature complex Aberrant  conduction of SV complex(es) Left anterior fascicular block artifact noted  No significant change since last tracing Confirmed by Christy Gentles  MD, [place]  [phone]) on 11/21/2015 12:20:34 AM     Medications  albuterol (PROVENTIL) (2.5 MG/3ML) 0.083% nebulizer solution 2.5 mg (2.5 mg Nebulization Given 11/20/15 2352)  predniSONE (DELTASONE) tablet 60 mg (60 mg Oral  Given 11/21/15 0024)  albuterol (PROVENTIL,VENTOLIN) solution continuous neb (10 mg/hr Nebulization Given 11/21/15 0038)  ipratropium-albuterol (DUONEB) 0.5-2.5 (3) MG/3ML nebulizer solution 3 mL (3 mLs Nebulization Given 11/21/15 0150)  benzonatate (TESSALON) capsule 100 mg (100 mg Oral Given 11/21/15 0221)    MDM   Final diagnoses:  Chronic obstructive pulmonary disease with acute exacerbation ([place])    Nursing notes including past medical history and social history reviewed and considered in documentation xrays/imaging reviewed by myself and considered during evaluation Labs/vital reviewed myself and considered during evaluation   I personally performed the services described in this documentation, which was scribed in my presence. The recorded information has been reviewed and is accurate.        Ripley Fraise, MD 11/21/15 0300

## 2015-11-22 ENCOUNTER — Ambulatory Visit: Payer: Self-pay | Admitting: Family Medicine

## 2015-11-22 DIAGNOSIS — E871 Hypo-osmolality and hyponatremia: Secondary | ICD-10-CM

## 2015-11-22 DIAGNOSIS — E118 Type 2 diabetes mellitus with unspecified complications: Secondary | ICD-10-CM

## 2015-11-22 DIAGNOSIS — F209 Schizophrenia, unspecified: Secondary | ICD-10-CM

## 2015-11-22 LAB — GLUCOSE, CAPILLARY
Glucose-Capillary: 232 mg/dL — ABNORMAL HIGH (ref 65–99)
Glucose-Capillary: 307 mg/dL — ABNORMAL HIGH (ref 65–99)
Glucose-Capillary: 325 mg/dL — ABNORMAL HIGH (ref 65–99)

## 2015-11-22 LAB — BASIC METABOLIC PANEL
Anion gap: 9 (ref 5–15)
BUN: 25 mg/dL — ABNORMAL HIGH (ref 6–20)
CO2: 23 mmol/L (ref 22–32)
Calcium: 9.5 mg/dL (ref 8.9–10.3)
Chloride: 97 mmol/L — ABNORMAL LOW (ref 101–111)
Creatinine, Ser: 1.46 mg/dL — ABNORMAL HIGH (ref 0.61–1.24)
GFR calc Af Amer: 56 mL/min — ABNORMAL LOW (ref >60)
GFR calc non Af Amer: 49 mL/min — ABNORMAL LOW (ref >60)
Glucose, Bld: 221 mg/dL — ABNORMAL HIGH (ref 65–99)
Potassium: 4.5 mmol/L (ref 3.5–5.1)
Sodium: 129 mmol/L — ABNORMAL LOW (ref 135–145)

## 2015-11-22 MED ORDER — HYDROCODONE-HOMATROPINE 5-1.5 MG/5ML PO SYRP: 5.0000 mL | ORAL_SOLUTION | Freq: Four times a day (QID) | ORAL | Status: DC | PRN

## 2015-11-22 MED ORDER — GUAIFENESIN ER 600 MG PO TB12
600.0000 mg | ORAL_TABLET | Freq: Two times a day (BID) | ORAL | Status: DC
Start: 2015-11-22 — End: 2016-01-05

## 2015-11-22 MED ORDER — AZITHROMYCIN 250 MG PO TABS: ORAL_TABLET | ORAL | Status: DC

## 2015-11-22 MED ORDER — PREDNISONE 10 MG PO TABS: ORAL_TABLET | ORAL | Status: DC

## 2015-11-22 NOTE — Progress Notes (Signed)
Inpatient Diabetes Program Recommendations  AACE/ADA: New Consensus Statement on Inpatient Glycemic Control (2015)  Target Ranges:  Prepandial:   less than 140 mg/dL      Peak postprandial:   less than 180 mg/dL (1-2 hours)      Critically ill patients:  140 - 180 mg/dL  Results for MATAEO, PERSING (MRN [id number]) as of 11/22/2015 07:22  Ref. Range 11/22/2015 05:35  Glucose Latest Ref Range: 65-99 mg/dL 221 (H)   Results for ZEBULUN, BLYTHE (MRN [id number]) as of 11/22/2015 07:22  Ref. Range 11/21/2015 11:43 11/21/2015 16:25 11/21/2015 20:43  Glucose-Capillary Latest Ref Range: 65-99 mg/dL 331 (H) 206 (H) 269 (H)   Review of Glycemic Control  Diabetes history: DM2 Outpatient Diabetes medications: Glipizide 10 mg daily, Metformin 1000 mg BID Current orders for Inpatient glycemic control: Novolog 0-9 units TID with meals  Inpatient Diabetes Program Recommendations: Insulin - Basal: If steroids will be continued as ordered (Solumedrol 80 mg TID), please consider ordering low dose basal insulin; recommend starting with Lantus 10 units Q24H starting now. Correction (SSI): Please consider increasing Novolog correction to moderate scale and ordering Novolog bedtime correction scale.  Thanks, Barnie Alderman, RN, MSN, CDE Diabetes Coordinator Inpatient Diabetes Program [phone] (Team Pager from [place] to [place]) [phone] ([facility] office) [phone] [facility] office) [phone] [facility] [place] office)

## 2015-11-22 NOTE — Progress Notes (Signed)
  SATURATION QUALIFICATIONS: (This note is used to comply with regulatory documentation for home oxygen)  Patient Saturations on Room Air at Rest = __97_%  Patient Saturations on Room Air while Ambulating =__97%  Patient Saturations on ___ Liters of oxygen while Ambulating = __%    No oxygen needed while ambulating  Please briefly explain why patient needs

## 2015-11-22 NOTE — Care Management Important Message (Signed)
Important Message  Patient Details  Name: Matthew Molina MRN: [id number] Date of Birth: October 22, 1951   Medicare Important Message Given:  N/A - LOS <3 / Initial given by admissions    Sherald Barge, RN 11/22/2015, 11:43 AM

## 2015-11-22 NOTE — Progress Notes (Signed)
Patient alert and oriented, independent, VSS, pt. Tolerating diet well. No complaints of pain or nausea. Pt. Had IV removed tip intact. Pt. Had prescriptions given. Pt. Voiced understanding of discharge instructions with no further questions. Pt. Discharged via wheelchair with auxilliary.  

## 2015-11-22 NOTE — Care Management Note (Signed)
Case Management Note  Patient Details  Name: Matthew Molina MRN: [id number] Date of Birth: 1951-08-25  Subjective/Objective:                  Pt is from home, lives alone with strong family support and a CAP aid that comes 7 days a week. Pt has cane and walker at home. Pt has neb and home O2 concentrator at home through [facility] but does not have port tanks. O2 assessment completed and pt does not met requirement for cont O2.  Action/Plan: Pt plans to return home with self care and aid today. No further CM needs.   Expected Discharge Date:  11/23/15               Expected Discharge Plan:  Home/Self Care  In-House Referral:  NA  Discharge planning Services  CM Consult  Post Acute Care Choice:  NA Choice offered to:  NA  DME Arranged:    DME Agency:     HH Arranged:    HH Agency:     Status of Service:  Completed, signed off  Medicare Important Message Given:    Date Medicare IM Given:    Medicare IM give by:    Date Additional Medicare IM Given:    Additional Medicare Important Message give by:     If discussed at [place] of Stay Meetings, dates discussed:    Additional Comments:  Sherald Barge, RN 11/22/2015, 11:42 AM
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
## 2015-11-24 ENCOUNTER — Encounter (HOSPITAL_COMMUNITY): Payer: Self-pay | Admitting: Psychiatry

## 2015-11-24 ENCOUNTER — Ambulatory Visit (INDEPENDENT_AMBULATORY_CARE_PROVIDER_SITE_OTHER): Payer: Medicare HMO | Admitting: Psychiatry

## 2015-11-24 VITALS — BP 97/61 | HR 115 | Ht 73.0 in | Wt 261.0 lb

## 2015-11-24 DIAGNOSIS — F200 Paranoid schizophrenia: Secondary | ICD-10-CM | POA: Diagnosis not present

## 2015-11-24 MED ORDER — BUSPIRONE HCL 15 MG PO TABS: ORAL_TABLET | ORAL | Status: DC

## 2015-11-24 MED ORDER — RISPERIDONE 1 MG PO TABS: 1.0000 mg | ORAL_TABLET | Freq: Every day | ORAL | Status: DC

## 2015-11-24 MED ORDER — RISPERIDONE 4 MG PO TABS: 4.0000 mg | ORAL_TABLET | Freq: Every day | ORAL | Status: DC

## 2015-11-24 NOTE — Progress Notes (Signed)
Patient ID: Matthew Molina, male   DOB: [birth year], 64 y.o.   MRN: [id number] Patient ID: Matthew Molina, male   DOB: 1951/09/20, 64 y.o.   MRN: [id number] Patient ID: Matthew Molina, male   DOB: October 17, 1951, 64 y.o.   MRN: [id number]  Psychiatric Assessment Adult  Patient Identification:  Matthew Molina Date of Evaluation:  11/24/2015 Chief Complaint: "I was in the hospital recently" History of Chief Complaint:   Chief Complaint  Patient presents with  . Schizophrenia  . Follow-up    Anxiety Symptoms include nervous/anxious behavior.     this patient is a 64 year old divorced black male who lives alone in [place]. He has 2 daughters but they do not live in the area. He used to work in a [employer] but has been on disability since 1996 for schizophrenia.  The patient was referred by his primary physician, Dr. [name], for further assessment and treatment of schizophrenia.  The patient is a poor historian but thinks he became mentally ill sometime in his 65s. In his 65s he used a lot of marijuana pills and LSD which may have contributed to the mental illness. He also drank quite a bit of alcohol. Around age 27 however he "got saved" and stopped all of this.  Apparently his symptoms started when he worked in [employer]. He began hearing voices and later became increasingly paranoid. He used to go to treatment at the [facility] and later at [facility] and families. He doesn't remember the medications that he's taken over the years. He's been on Risperdal for quite a while and last year his symptoms worsened and had to be increased from 4-5 mg at bedtime. He has been only hospitalized once at [facility] years ago after he was involuntarily committed by a psychiatrist at the [facility].  Currently the patient states that he still hears a little bit of voices in the evenings. They're telling him to drink or gamble but he doesn't listen to them. He  denies being paranoid but sometimes feels like he is being watched. He is most concerned because he is getting more depressed. His mood is low and he is more withdrawn than he used to be. He reads his Bible but doesn't go to church like he did before. His energy is low but he is eating and sleeping well. He is anxious but [name] put him on BuSpar which is helped to some degree. He denies overt panic attacks He doesn't have any thoughts of hurting self or others. He is fairly compliant with his medicines but sometimes forgets and has to have his neighbor help him. He tends to stay isolated. He does not use any drugs or alcohol currently  The patient returns after 3 months. He was hospitalized yet again this past month for COPD exacerbation. He claims it was from smoke in his apartment from his cooking. He also claims that he has quit smoking. While in the hospital he had hyponatremia on his blood pressure pills were held. He states he is started back on them at home and I don't think this is correct and I urged him to call [name] about this. He states that he feels pretty good now and he has occasional voices but they don't bother him. He is taking BuSpar at the half of the 15 mg twice a day because he can't swallow whole pill. He feels like this is helping his anxiety Review of  Systems  Constitutional: Positive for activity change.  HENT: Negative.   Eyes: Negative.   Respiratory: Negative.   Cardiovascular: Negative.   Gastrointestinal: Negative.   Endocrine: Negative.   Genitourinary: Negative.   Musculoskeletal: Positive for joint swelling, arthralgias and gait problem.  Skin: Negative.   Allergic/Immunologic: Negative.   Neurological: Negative.   Hematological: Negative.   Psychiatric/Behavioral: Positive for hallucinations and dysphoric mood. The patient is nervous/anxious.    Physical Exam not done  Depressive Symptoms: depressed mood, anhedonia, psychomotor  retardation, anxiety, loss of energy/fatigue,  (Hypo) Manic Symptoms:   Elevated Mood:  No Irritable Mood:  No Grandiosity:  No Distractibility:  No Labiality of Mood:  No Delusions:  Yes Hallucinations:  Yes Impulsivity:  No Sexually Inappropriate Behavior:  No Financial Extravagance:  No Flight of Ideas:  No  Anxiety Symptoms: Excessive Worry:  Yes Panic Symptoms:  No Agoraphobia:  No Obsessive Compulsive: No  Symptoms: None, Specific Phobias:  No Social Anxiety:  No  Psychotic Symptoms:  Hallucinations: Yes Auditory Delusions:  Yes Paranoia:  Yes   Ideas of Reference:  No  PTSD Symptoms: Ever had a traumatic exposure:  No Had a traumatic exposure in the last month:  No Re-experiencing: No None Hypervigilance:  No Hyperarousal: No None Avoidance: No None  Traumatic Brain Injury: Yes fell down while playing as a child  Past Psychiatric History: Diagnosis: Schizophrenia   Hospitalizations: Once in the past at [facility]   Outpatient Care: For many years at [place]: none  Self-Mutilation: none  Suicidal Attempts: none  Violent Behaviors: none   Past Medical History:   Past Medical History  Diagnosis Date  . Nicotine addiction   . Schizophrenia ([place])   . Hyperlipidemia   . Obesity   . Diabetes mellitus   . Arthritis   . Acute respiratory failure with hypoxia ([place])   . Acute kidney injury ([place])   . Multiple lung nodules on CT 04/02/2015  . Depression   . COPD (chronic obstructive pulmonary disease) ([place])   . Diabetes mellitus without complication ([place])   . Hypertension   . Hypercholesterolemia    History of Loss of Consciousness:  No Seizure History:  No Cardiac History:  No Allergies:   Allergies  Allergen Reactions  . Sertraline Hcl     Stomach upset/pain  . Wellbutrin [Bupropion] Other (See Comments)    Makes stomach hurt  . Wellbutrin [Bupropion]   . Zoloft [Sertraline Hcl]     Current Medications:  Current Outpatient Prescriptions  Medication Sig Dispense Refill  . risperiDONE (RISPERDAL) 1 MG tablet Take 1 tablet (1 mg total) by mouth at bedtime. Takes 5mg  total every day at bedtime 30 tablet 2  . risperidone (RISPERDAL) 4 MG tablet Take 1 tablet (4 mg total) by mouth at bedtime. 30 tablet 2  . albuterol (PROVENTIL HFA;VENTOLIN HFA) 108 (90 BASE) MCG/ACT inhaler Inhale 2 puffs into the lungs every 6 (six) hours as needed for wheezing or shortness of breath. 1 Inhaler 2  . aspirin EC 81 MG tablet Take 81 mg by mouth daily.    Marland Kitchen azithromycin (ZITHROMAX) 250 MG tablet take one tab po daily for 4 days 4 each 0  . budesonide-formoterol (SYMBICORT) 80-4.5 MCG/ACT inhaler INHALE 2 PUFFS INTO LUNGS TWICE DAILY. (Patient taking differently: Inhale 2 puffs into the lungs 2 (two) times daily. ) 10.2 g 3  . busPIRone (BUSPAR) 15 MG tablet Take half a pill twice  a day 30 tablet 2  . ezetimibe (ZETIA) 10 MG tablet Take 1 tablet (10 mg total) by mouth daily.    Marland Kitchen glipiZIDE (GLUCOTROL XL) 10 MG 24 hr tablet Take 1 tablet (10 mg total) by mouth daily.    Marland Kitchen guaiFENesin (MUCINEX) 600 MG 12 hr tablet Take 1 tablet (600 mg total) by mouth 2 (two) times daily. 30 tablet 0  . HYDROcodone-homatropine (HYCODAN) 5-1.5 MG/5ML syrup Take 5 mLs by mouth every 6 (six) hours as needed for cough. 120 mL 0  . ipratropium-albuterol (DUONEB) 0.5-2.5 (3) MG/3ML SOLN Take 3 mLs by nebulization every 4 (four) hours as needed. (Patient taking differently: Take 3 mLs by nebulization every 4 (four) hours as needed (for shortness of breath). ) 360 mL 1  . metFORMIN (GLUCOPHAGE) 1000 MG tablet TAKE 1 TABLET BY MOUTH TWICE DAILY WITH FOOD FOR DIABETES.    . pravastatin (PRAVACHOL) 40 MG tablet TAKE (1) TABLET BY MOUTH AT BEDTIME FOR CHOLESTEROL. (Patient taking differently: Take 40 mg by mouth at bedtime. )    . predniSONE (DELTASONE) 10 MG tablet Take 60mg  po daily for 2 days then 40mg  daily for 2 days then  30mg  daily for 2 days then 20mg  daily for 2 days then 10mg  daily for 2 days 32 tablet 0  . SPIRIVA HANDIHALER 18 MCG inhalation capsule INHALE 1 CAPSULE AS DIRECTED ONCE A DAY. 30 capsule 4  . [DISCONTINUED] sildenafil (VIAGRA) 100 MG tablet Take 100 mg by mouth. Take one tablet 30 mins before intercourse      No current facility-administered medications for this visit.    Previous Psychotropic Medications:  Medication Dose   Zoloft, doesn't remember the name of the others                        Substance Abuse History in the last 12 months: Substance Age of 1st Use Last Use Amount Specific Type  Nicotine    smokes one to 2 packs per day    Alcohol      Cannabis      Opiates      Cocaine      Methamphetamines      LSD      Ecstasy      Benzodiazepines      Caffeine      Inhalants      Others:                          Medical Consequences of Substance Abuse: Use of hallucinogens in the past may have contributed to his mental illness  Legal Consequences of Substance Abuse: none  Family Consequences of Substance Abuse: none  Blackouts:  No DT's:  No Withdrawal Symptoms:  No None  Social History: Current Place of Residence: [place] of Birth: [place] Family Members: One brother, 4 sisters Marital Status:  Divorced Children:   Sons:   Daughters: 2 Relationships:  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 Legal History: Arrested in his 66s for DUI, later arrested for an decent exposure while he was mentally ill in his 8s Hobbies/Interests: Reading his Bible  Family History:   Family History  Problem Relation Age of Onset  . Diabetes Mother   . Hypertension Mother   . Stroke Mother   . Diabetes Sister   . Heart disease  Sister   . Kidney disease Father     Mental  Status Examination/Evaluation: Objective:  Appearance: Casual and Fairly Groomed  Eye Contact::  fair  Speech:  Slow  Volume:  Decreased  Mood:  Slightly anxious   Affect:  Blunt  Thought Process:  Coherent  Orientation:  Full (Time, Place, and Person)  Thought Content: Denies hallucinations today   Suicidal Thoughts:  No  Homicidal Thoughts:  No  Judgement:  Fair  Insight:  Fair  Psychomotor Activity:  Normal  Akathisia:  No  Handed:  Right  AIMS (if indicated):  Occasional tremors in  hands but none are evident today no other tardive dyskinesia symptoms   Assets:  Communication Skills Desire for Improvement Resilience    Laboratory/X-Ray Psychological Evaluation(s)  His PSA is elevated. A1c is elevated slightly as well as triglycerides. [name] is following all of this. Cholesterol is good      Assessment:  Axis I: Schizoaffective Disorder  AXIS I Schizoaffective Disorder  AXIS II Deferred  AXIS III Past Medical History  Diagnosis Date  . Nicotine addiction   . Schizophrenia ([place])   . Hyperlipidemia   . Obesity   . Diabetes mellitus   . Arthritis   . Acute respiratory failure with hypoxia ([place])   . Acute kidney injury ([place])   . Multiple lung nodules on CT 04/02/2015  . Depression   . COPD (chronic obstructive pulmonary disease) ([place])   . Diabetes mellitus without complication ([place])   . Hypertension   . Hypercholesterolemia      AXIS IV other psychosocial or environmental problems  AXIS V 51-60 moderate symptoms   Treatment Plan/Recommendations:  Plan of Care: Medication management   Laboratory already ordered   Psychotherapy "not indicated   Medications: He will continue Risperdal 5 mg at bedtime for his schizophrenic-psychotic symptoms. He can continue BuSpar 7.5 mg twice a day for anxiety   Routine PRN Medications:  No  Consultations:   Safety Concerns:  He denies thoughts of harm to self or others   Other: He'll return in 3 months     Levonne Spiller, MD 12/7/20162:45 PM

## 2015-11-25 ENCOUNTER — Other Ambulatory Visit: Payer: Self-pay | Admitting: Family Medicine

## 2015-11-29 ENCOUNTER — Telehealth: Payer: Self-pay | Admitting: Family Medicine

## 2015-11-29 ENCOUNTER — Encounter: Payer: Self-pay | Admitting: Family Medicine

## 2015-11-29 ENCOUNTER — Ambulatory Visit (INDEPENDENT_AMBULATORY_CARE_PROVIDER_SITE_OTHER): Payer: Commercial Managed Care - HMO | Admitting: Family Medicine

## 2015-11-29 ENCOUNTER — Other Ambulatory Visit: Payer: Self-pay

## 2015-11-29 VITALS — BP 108/62 | HR 96 | Resp 16 | Ht 73.0 in | Wt 258.0 lb

## 2015-11-29 DIAGNOSIS — F209 Schizophrenia, unspecified: Secondary | ICD-10-CM

## 2015-11-29 DIAGNOSIS — E785 Hyperlipidemia, unspecified: Secondary | ICD-10-CM

## 2015-11-29 DIAGNOSIS — E119 Type 2 diabetes mellitus without complications: Secondary | ICD-10-CM

## 2015-11-29 DIAGNOSIS — I10 Essential (primary) hypertension: Secondary | ICD-10-CM

## 2015-11-29 DIAGNOSIS — E669 Obesity, unspecified: Secondary | ICD-10-CM

## 2015-11-29 DIAGNOSIS — E1169 Type 2 diabetes mellitus with other specified complication: Secondary | ICD-10-CM

## 2015-11-29 DIAGNOSIS — Z720 Tobacco use: Secondary | ICD-10-CM

## 2015-11-29 DIAGNOSIS — Z114 Encounter for screening for human immunodeficiency virus [HIV]: Secondary | ICD-10-CM

## 2015-11-29 DIAGNOSIS — J9621 Acute and chronic respiratory failure with hypoxia: Secondary | ICD-10-CM

## 2015-11-29 DIAGNOSIS — Z1159 Encounter for screening for other viral diseases: Secondary | ICD-10-CM

## 2015-11-29 DIAGNOSIS — Z09 Encounter for follow-up examination after completed treatment for conditions other than malignant neoplasm: Secondary | ICD-10-CM

## 2015-11-29 DIAGNOSIS — E66811 Obesity, class 1: Secondary | ICD-10-CM

## 2015-11-29 MED ORDER — EZETIMIBE 10 MG PO TABS: 10.0000 mg | ORAL_TABLET | Freq: Every day | ORAL | Status: DC

## 2015-11-29 MED ORDER — GLIPIZIDE ER 10 MG PO TB24
10.0000 mg | ORAL_TABLET | Freq: Every day | ORAL | Status: DC
Start: 2015-11-29 — End: 2016-01-05

## 2015-11-29 MED ORDER — PREDNISONE 5 MG PO TABS: 5.0000 mg | ORAL_TABLET | Freq: Every day | ORAL | Status: DC

## 2015-11-29 MED ORDER — TRIAMTERENE-HCTZ 37.5-25 MG PO TABS: 1.0000 | ORAL_TABLET | Freq: Every day | ORAL | Status: DC

## 2015-11-29 MED ORDER — VALSARTAN 320 MG PO TABS: 320.0000 mg | ORAL_TABLET | Freq: Every day | ORAL | Status: DC

## 2015-11-29 MED ORDER — AMLODIPINE BESYLATE 10 MG PO TABS: 10.0000 mg | ORAL_TABLET | Freq: Every day | ORAL | Status: DC

## 2015-11-29 NOTE — Assessment & Plan Note (Signed)
clinuically stable and followed by psychiatry

## 2015-11-29 NOTE — Assessment & Plan Note (Signed)
Hyperlipidemia:Low fat diet discussed and encouraged.   Lipid Panel  Lab Results  Component Value Date   CHOL 111 05/07/2015   HDL 28* 05/07/2015   LDLCALC 39 05/07/2015   TRIG 222* 05/07/2015   CHOLHDL 4.0 05/07/2015      Updated lab needed at/ before next visit.

## 2015-11-29 NOTE — Patient Instructions (Signed)
F/u in January as before, call if you nheed me sooner  CONGRATS on stopping smoking, you need to stay OFF oF CIGARETTES fior the reest of your life, this will improve your health  New is once daily prednisone 5mg  , start this AFTER you have completed your current prednisone 10 mg taper  Fasting labs and urine in January before visit please  Twice daily neb treatments and twice daily symbicort to be continued  Thanks for choosing [facility], we consider it a privelige to serve you.  All the best for 2017!

## 2015-11-29 NOTE — Telephone Encounter (Signed)
pls try to locate sleep study justifying pt's need for oxygen,. At [facility]V today pulse ox was over 90% I cannot sig/ send form without this info, thanks

## 2015-11-29 NOTE — Telephone Encounter (Signed)
Will need to get documentation from recent hospital stay or new overnight oximetry.

## 2015-11-29 NOTE — Assessment & Plan Note (Signed)
Deteriorated. Patient re-educated about  the importance of commitment to a  minimum of 150 minutes of exercise per week.  The importance of healthy food choices with portion control discussed. Encouraged to start a food diary, count calories and to consider  joining a support group. Sample diet sheets offered. Goals set by the patient for the next several months.   Weight /BMI 11/29/2015 11/21/2015 11/20/2015  WEIGHT 258 lb 22 lb 8 oz -  HEIGHT 6\' 1"  - 6\' 1"   BMI 34.05 kg/m2 - 2.97 kg/m2  Some encounter information is confidential and restricted. Go to Review Flowsheets activity to see all data.    Current exercise per week 40 minutes.

## 2015-11-29 NOTE — Assessment & Plan Note (Signed)
Controlled, no change in medication DASH diet and commitment to daily physical activity for a minimum of 30 minutes discussed and encouraged, as a part of hypertension management. The importance of attaining a healthy weight is also discussed.  BP/Weight 11/29/2015 11/22/2015 11/21/2015 11/17/2015 11/06/2015 11/05/2015 [id number]  Systolic BP 123XX123 [id number] - [id number] - 123XX123  Diastolic BP 62 52 - 72 74 - 72  Wt. (Lbs) 258 - 22.5 - - 251.1 250  BMI 34.05 - 2.97 - - 33.14 32.99  Some encounter information is confidential and restricted. Go to Review Flowsheets activity to see all data.

## 2015-11-29 NOTE — Assessment & Plan Note (Signed)
Reports 3 weeks being nicotine free, applauded on this and encouraged to continue same

## 2015-11-29 NOTE — Assessment & Plan Note (Signed)
Normal oxygen in room air now that he has recovered from his acute decompensation, need ofr oxygen is only at bedtime once we have established diagnosis of nocturnal hypoxia, will check into this

## 2015-11-29 NOTE — Assessment & Plan Note (Signed)
Updated lab needed at/ before next visit. [name] is reminded of the importance of commitment to daily physical activity for 30 minutes or more, as able and the need to limit carbohydrate intake to 30 to 60 grams per meal to help with blood sugar control.   The need to take medication as prescribed, test blood sugar as directed, and to call between visits if there is a concern that blood sugar is uncontrolled is also discussed.   Matthew Molina is reminded of the importance of daily foot exam, annual eye examination, and good blood sugar, blood pressure and cholesterol control.  Diabetic Labs Latest Ref Rng 11/22/2015 11/21/2015 11/21/2015 11/06/2015 11/05/2015  HbA1c 4.8 - 5.6 % - - - - -  Microalbumin <2.0 mg/dL - - - - -  Micro/Creat Ratio 0.0 - 30.0 mg/g - - - - -  Chol 0 - 200 mg/dL - - - - -  HDL >=40 mg/dL - - - - -  Calc LDL 0 - 99 mg/dL - - - - -  Triglycerides <150 mg/dL - - - - -  Creatinine 0.61 - 1.24 mg/dL 1.46(H) 1.59(H) 1.60(H) 1.66(H) 1.74(H)   BP/Weight 11/29/2015 11/22/2015 11/21/2015 11/17/2015 11/06/2015 11/05/2015 [id number]  Systolic BP 123XX123 XX123456 - [id number] - [id number]  Diastolic BP 62 52 - 72 74 - 72  Wt. (Lbs) 258 - 22.5 - - 251.1 250  BMI 34.05 - 2.97 - - 33.14 32.99  Some encounter information is confidential and restricted. Go to Review Flowsheets activity to see all data.   Foot/eye exam completion dates Latest Ref Rng 08/30/2015 01/27/2015  Eye Exam No Retinopathy - No Retinopathy  Foot Form Completion - Done -

## 2015-11-29 NOTE — Progress Notes (Signed)
Subjective:    Patient ID: Matthew Molina, male    DOB: 1951-01-09, 64 y.o.   MRN: [id number]  HPI Pt in for follow up of recent hospitalization for COPD exacerbation, his 2nd in less Molina 2 months, and 4th in the past 4 months. Reports smoking cessation x 3 weeks, and is currently on a prednisone taper Denies shortness of breath or wheezing, is not committed to regular exercise but will work on this. Denies fever , chills or sputum, reports less smoke exposure in the home from cookingh and feels this will help with his breathing    Review of Systems See HPI Denies recent fever or chills. Denies sinus pressure, nasal congestion, ear pain or sore throat. Denies chest congestion, productive cough or wheezing. Denies chest pains, palpitations and leg swelling Denies abdominal pain, nausea, vomiting,diarrhea or constipation.   Denies dysuria, frequency, hesitancy or incontinence. Chronic joint pain, swelling and limitation in mobility.Ambulates with a cane Denies headaches, seizures, numbness, or tingling. Denies uncontrolled  depression, anxiety or insomnia. Denies skin break down or rash.        Objective:   Physical Exam BP 108/62 mmHg  Pulse 96  Resp 16  Ht 6\' 1"  (1.854 m)  Wt 258 lb (117.028 kg)  BMI 34.05 kg/m2  SpO2 98%   Patient alert and oriented and in no cardiopulmonary distress.  HEENT: No facial asymmetry, EOMI,   oropharynx pink and moist.  Neck supple no JVD, no mass.  Chest: Clear to auscultation bilaterally.No crackles or wheezes, good air entry throughout  CVS: S1, S2 no murmurs, no S3.Regular rate.  ABD: Soft non tender.   Ext: No edema  MS: decreased ROM spine, hips and knees  Skin: Intact, no ulcerations or rash noted.  Psych: Good eye contact, normal affect. Memory intact not anxious or depressed appearing.  CNS: CN 2-12 intact, power,  normal throughout.no focal deficits noted.        Assessment & Plan:  Hospital discharge  follow-up Hospitalized twice in past 6 weeks with COPD exacerbation, now nicotine free x 3 weeks, and commited to daily duoneb treatment and symbicort Will continue prednisone 5 mg tab daily for next 1 month, currently completing a recent 10 mg taper  Acute on chronic respiratory failure with hypoxia Normal oxygen in room air now that he has recovered from his acute decompensation, need ofr oxygen is only at bedtime once we have established diagnosis of nocturnal hypoxia, will check into this  Diabetes mellitus type 2 in obese Updated lab needed at/ before next visit. Mr. Hamel is reminded of the importance of commitment to daily physical activity for 30 minutes or more, as able and the need to limit carbohydrate intake to 30 to 60 grams per meal to help with blood sugar control.   The need to take medication as prescribed, test blood sugar as directed, and to call between visits if there is a concern that blood sugar is uncontrolled is also discussed.   Mr. Mumper is reminded of the importance of daily foot exam, annual eye examination, and good blood sugar, blood pressure and cholesterol control.  Diabetic Labs Latest Ref Rng 11/22/2015 11/21/2015 11/21/2015 11/06/2015 11/05/2015  HbA1c 4.8 - 5.6 % - - - - -  Microalbumin <2.0 mg/dL - - - - -  Micro/Creat Ratio 0.0 - 30.0 mg/g - - - - -  Chol 0 - 200 mg/dL - - - - -  HDL >=40 mg/dL - - - - -  Calc LDL 0 - 99 mg/dL - - - - -  Triglycerides <150 mg/dL - - - - -  Creatinine 0.61 - 1.24 mg/dL 1.46(H) 1.59(H) 1.60(H) 1.66(H) 1.74(H)   BP/Weight 11/29/2015 11/22/2015 11/21/2015 11/17/2015 11/06/2015 11/05/2015 [id number]  Systolic BP 123XX123 XX123456 - [id number] - 123XX123  Diastolic BP 62 52 - 72 74 - 72  Wt. (Lbs) 258 - 22.5 - - 251.1 250  BMI 34.05 - 2.97 - - 33.14 32.99  Some encounter information is confidential and restricted. Go to Review Flowsheets activity to see all data.   Foot/eye exam completion dates Latest Ref Rng 08/30/2015 01/27/2015  Eye  Exam No Retinopathy - No Retinopathy  Foot Form Completion - Done -         Tobacco abuse Reports 3 weeks being nicotine free, applauded on this and encouraged to continue same  Schizophrenia, unspecified type ([place]) clinuically stable and followed by psychiatry  Hyperlipidemia Hyperlipidemia:Low fat diet discussed and encouraged.   Lipid Panel  Lab Results  Component Value Date   CHOL 111 05/07/2015   HDL 28* 05/07/2015   LDLCALC 39 05/07/2015   TRIG 222* 05/07/2015   CHOLHDL 4.0 05/07/2015      Updated lab needed at/ before next visit.   Essential hypertension Controlled, no change in medication DASH diet and commitment to daily physical activity for a minimum of 30 minutes discussed and encouraged, as a part of hypertension management. The importance of attaining a healthy weight is also discussed.  BP/Weight 11/29/2015 11/22/2015 11/21/2015 11/17/2015 11/06/2015 11/05/2015 [id number]  Systolic BP 123XX123 XX123456 - XX123456 [id number] - 123XX123  Diastolic BP 62 52 - 72 74 - 72  Wt. (Lbs) 258 - 22.5 - - 251.1 250  BMI 34.05 - 2.97 - - 33.14 32.99  Some encounter information is confidential and restricted. Go to Review Flowsheets activity to see all data.        Obesity (BMI 30.0-34.9) Deteriorated. Patient re-educated about  the importance of commitment to a  minimum of 150 minutes of exercise per week.  The importance of healthy food choices with portion control discussed. Encouraged to start a food diary, count calories and to consider  joining a support group. Sample diet sheets offered. Goals set by the patient for the next several months.   Weight /BMI 11/29/2015 11/21/2015 11/20/2015  WEIGHT 258 lb 22 lb 8 oz -  HEIGHT 6\' 1"  - 6\' 1"   BMI 34.05 kg/m2 - 2.97 kg/m2  Some encounter information is confidential and restricted. Go to Review Flowsheets activity to see all data.    Current exercise per week 40 minutes.

## 2015-11-29 NOTE — Assessment & Plan Note (Signed)
Hospitalized twice in past 6 weeks with COPD exacerbation, now nicotine free x 3 weeks, and commited to daily duoneb treatment and symbicort Will continue prednisone 5 mg tab daily for next 1 month, currently completing a recent 10 mg taper

## 2015-11-30 NOTE — Telephone Encounter (Signed)
Patient states that he already has the oxygen equipment in home.  Explained that in order for re certification he will need to have a new overnight oximetry done.    He agrees.

## 2015-11-30 NOTE — Telephone Encounter (Signed)
No noted need for oximetry testing done in hospital.  Will contact patient and see if he would like overnight oximetry done.

## 2015-12-08 ENCOUNTER — Telehealth: Payer: Self-pay | Admitting: Family Medicine

## 2015-12-08 NOTE — Telephone Encounter (Signed)
PT does NOT qualify for oxygen, need to discontinue nocturnal oxygen also, needs no oxygen

## 2015-12-08 NOTE — Telephone Encounter (Signed)
Noted  

## 2015-12-08 NOTE — Telephone Encounter (Signed)
Matthew Molina is calling stating that he is going to start receiving his diabetic supplies through the mail and his insurance company will be notifying us with that information

## 2015-12-08 NOTE — Telephone Encounter (Signed)
Noted.  Will fax discontinue notice to [facility].

## 2015-12-08 NOTE — Telephone Encounter (Signed)
Patient aware.  Note sent to supplier.

## 2015-12-21 ENCOUNTER — Other Ambulatory Visit: Payer: Self-pay | Admitting: Family Medicine

## 2015-12-28 ENCOUNTER — Encounter: Payer: Self-pay | Admitting: Family Medicine

## 2016-01-05 ENCOUNTER — Encounter: Payer: Self-pay | Admitting: Family Medicine

## 2016-01-05 ENCOUNTER — Ambulatory Visit (INDEPENDENT_AMBULATORY_CARE_PROVIDER_SITE_OTHER): Payer: Commercial Managed Care - HMO | Admitting: Family Medicine

## 2016-01-05 VITALS — BP 112/70 | HR 97 | Resp 18 | Ht 73.0 in | Wt 258.1 lb

## 2016-01-05 DIAGNOSIS — E669 Obesity, unspecified: Secondary | ICD-10-CM

## 2016-01-05 DIAGNOSIS — E1169 Type 2 diabetes mellitus with other specified complication: Secondary | ICD-10-CM

## 2016-01-05 DIAGNOSIS — Z720 Tobacco use: Secondary | ICD-10-CM

## 2016-01-05 DIAGNOSIS — G4700 Insomnia, unspecified: Secondary | ICD-10-CM | POA: Diagnosis not present

## 2016-01-05 DIAGNOSIS — E119 Type 2 diabetes mellitus without complications: Secondary | ICD-10-CM | POA: Diagnosis not present

## 2016-01-05 DIAGNOSIS — Z1159 Encounter for screening for other viral diseases: Secondary | ICD-10-CM

## 2016-01-05 DIAGNOSIS — E785 Hyperlipidemia, unspecified: Secondary | ICD-10-CM

## 2016-01-05 DIAGNOSIS — Z114 Encounter for screening for human immunodeficiency virus [HIV]: Secondary | ICD-10-CM

## 2016-01-05 DIAGNOSIS — I10 Essential (primary) hypertension: Secondary | ICD-10-CM

## 2016-01-05 DIAGNOSIS — R1310 Dysphagia, unspecified: Secondary | ICD-10-CM | POA: Diagnosis not present

## 2016-01-05 HISTORY — DX: Insomnia, unspecified: G47.00

## 2016-01-05 LAB — COMPLETE METABOLIC PANEL WITH GFR
ALT: 26 U/L (ref 9–46)
AST: 19 U/L (ref 10–35)
Albumin: 4.4 g/dL (ref 3.6–5.1)
Alkaline Phosphatase: 46 U/L (ref 40–115)
BUN: 11 mg/dL (ref 7–25)
CO2: 24 mmol/L (ref 20–31)
Calcium: 10 mg/dL (ref 8.6–10.3)
Chloride: 97 mmol/L — ABNORMAL LOW (ref 98–110)
Creat: 1.36 mg/dL — ABNORMAL HIGH (ref 0.70–1.25)
GFR, Est African American: 62 mL/min (ref >=60)
GFR, Est Non African American: 54 mL/min — ABNORMAL LOW (ref >=60)
Glucose, Bld: 107 mg/dL — ABNORMAL HIGH (ref 65–99)
Potassium: 4.4 mmol/L (ref 3.5–5.3)
Sodium: 132 mmol/L — ABNORMAL LOW (ref 135–146)
Total Bilirubin: 0.5 mg/dL (ref 0.2–1.2)
Total Protein: 7.2 g/dL (ref 6.1–8.1)

## 2016-01-05 LAB — LIPID PANEL
Cholesterol: 129 mg/dL (ref 125–200)
HDL: 39 mg/dL — ABNORMAL LOW (ref >=40)
LDL Cholesterol: 63 mg/dL (ref <130)
Total CHOL/HDL Ratio: 3.3 Ratio (ref <=5.0)
Triglycerides: 136 mg/dL (ref <150)
VLDL: 27 mg/dL (ref <30)

## 2016-01-05 MED ORDER — EZETIMIBE 10 MG PO TABS: 5.0000 mg | ORAL_TABLET | Freq: Every day | ORAL | Status: DC

## 2016-01-05 MED ORDER — GLIPIZIDE ER 10 MG PO TB24: 10.0000 mg | ORAL_TABLET | Freq: Every day | ORAL | Status: DC

## 2016-01-05 MED ORDER — TRIAMTERENE-HCTZ 37.5-25 MG PO TABS: 1.0000 | ORAL_TABLET | Freq: Every day | ORAL | Status: DC

## 2016-01-05 MED ORDER — PRAVASTATIN SODIUM 40 MG PO TABS: ORAL_TABLET | ORAL | Status: DC

## 2016-01-05 MED ORDER — VALSARTAN 320 MG PO TABS: 320.0000 mg | ORAL_TABLET | Freq: Every day | ORAL | Status: DC

## 2016-01-05 MED ORDER — AMLODIPINE BESYLATE 10 MG PO TABS: 10.0000 mg | ORAL_TABLET | Freq: Every day | ORAL | Status: DC

## 2016-01-05 MED ORDER — TRAZODONE HCL 50 MG PO TABS: ORAL_TABLET | ORAL | Status: DC

## 2016-01-05 NOTE — Patient Instructions (Addendum)
Annual wellness in may, call if you need me before  New medication for sleep  Labs today  You are referred to dr Oneida Alar about swallowing difficulty   Please cut back on cigarettes, though hard to do!  Thanks for choosing [facility], we consider it a privelige to serve you.

## 2016-01-05 NOTE — Progress Notes (Signed)
Subjective:    Patient ID: Matthew Molina, male    DOB: 1951/03/10, 65 y.o.   MRN: [id number]  HPI   KEREK VARGHESE     MRN: [id number]      DOB: Apr 11, 1951   HPI Mr. Noteboom is here for follow up and re-evaluation of chronic medical conditions, medication management and review of any available recent lab and radiology data.  Preventive health is updated, specifically  Cancer screening and Immunization.   Questions or concerns regarding consultations or procedures which the PT has had in the interim are  addressed. The PT denies any adverse reactions to current medications since the last visit.  C/o difficulty swallowing mainly solids in past several weeks Has resumed smoking and unwilling to set a quit date at this tim. Increased anxiety and poor sleep Denies polyuria, polydipsia, blurred vision , or hypoglycemic episodes.    ROS Denies recent fever or chills. Denies sinus pressure, nasal congestion, ear pain or sore throat. Denies chest congestion, productive cough or wheezing. Denies chest pains, palpitations and leg swelling Denies abdominal pain, nausea, vomiting,diarrhea or constipation.   Denies dysuria, frequency, hesitancy or incontinence. Denies uncontrolled joint pain, swelling and limitation in mobility. Denies headaches, seizures, numbness, or tingling. Denies uncontrolled depression, does have increased  anxiety or insomnia. Denies skin break down or rash.   PE  BP 112/70 mmHg  Pulse 97  Resp 18  Ht 6\' 1"  (1.854 m)  Wt 258 lb 1.9 oz (117.082 kg)  BMI 34.06 kg/m2  SpO2 94%  Patient alert and oriented and in no cardiopulmonary distress.  HEENT: No facial asymmetry, EOMI,   oropharynx pink and moist.  Neck supple no JVD, no mass.  Chest: Clear to auscultation bilaterally.Decreased air entry   CVS: S1, S2 no murmurs, no S3.Regular rate.  ABD: Soft non tender.   Ext: No edema  MS: Adequate ROM spine, shoulders, hips and knees.  Skin: Intact, no  ulcerations or rash noted.  Psych: Good eye contact, normal affect. Memory intact not anxious or depressed appearing.  CNS: CN 2-12 intact, power,  normal throughout.no focal deficits noted.   Assessment & Plan   Essential hypertension Controlled, no change in medication DASH diet and commitment to daily physical activity for a minimum of 30 minutes discussed and encouraged, as a part of hypertension management. The importance of attaining a healthy weight is also discussed.  BP/Weight 01/27/2016 01/26/2016 01/22/2016 01/05/2016 11/29/2015 11/22/2015 123456  Systolic BP 99991111 123456 - XX123456 123XX123 XX123456 -  Diastolic BP 66 74 - 70 62 52 -  Wt. (Lbs) 256.8 - 256.4 258.12 258 - 22.5  BMI 34.82 - 33.83 34.06 34.05 - 2.97  Some encounter information is confidential and restricted. Go to Review Flowsheets activity to see all data.        Dysphagia New increased difficulty with swallowing solids , GI to eval and manage  Diabetes mellitus type 2 in obese [facility]) Mr. Mcphearson is reminded of the importance of commitment to daily physical activity for 30 minutes or more, as able and the need to limit carbohydrate intake to 30 to 60 grams per meal to help with blood sugar control.   The need to take medication as prescribed, test blood sugar as directed, and to call between visits if there is a concern that blood sugar is uncontrolled is also discussed.   Mr. Probus is reminded of the importance of daily foot exam, annual eye examination, and good blood sugar, blood pressure and  cholesterol control. Controlled, no change in medication   Diabetic Labs Latest Ref Rng 01/26/2016 01/25/2016 01/24/2016 01/23/2016 01/22/2016  HbA1c <5.7 % - - - - -  Microalbumin Not estab mg/dL - - - - -  Micro/Creat Ratio <30 mcg/mg creat - - - - -  Chol 125 - 200 mg/dL - - - - -  HDL >=40 mg/dL - - - - -  Calc LDL <130 mg/dL - - - - -  Triglycerides <150 mg/dL - - - - -  Creatinine 0.61 - 1.24 mg/dL 1.52(H) 1.56(H) 1.75(H) 1.73(H)  1.63(H)   BP/Weight 01/27/2016 01/26/2016 01/22/2016 01/05/2016 11/29/2015 11/22/2015 123456  Systolic BP 99991111 123456 - XX123456 123XX123 XX123456 -  Diastolic BP 66 74 - 70 62 52 -  Wt. (Lbs) 256.8 - 256.4 258.12 258 - 22.5  BMI 34.82 - 33.83 34.06 34.05 - 2.97  Some encounter information is confidential and restricted. Go to Review Flowsheets activity to see all data.   Foot/eye exam completion dates Latest Ref Rng 08/30/2015 01/27/2015  Eye Exam No Retinopathy - No Retinopathy  Foot Form Completion - Done -         Hyperlipidemia Hyperlipidemia:Low fat diet discussed and encouraged.   Lipid Panel  Lab Results  Component Value Date   CHOL 129 01/05/2016   HDL 39* 01/05/2016   LDLCALC 63 01/05/2016   TRIG 136 01/05/2016   CHOLHDL 3.3 01/05/2016   Encouraged tocommit to daily physical activity to improve HDL      Tobacco abuse Patient counseled for approximately 5 minutes regarding the health risks of ongoing nicotine use, specifically all types of cancer, heart disease, stroke and respiratory failure. The options available for help with cessation ,the behavioral changes to assist the process, and the option to either gradully reduce usage  Or abruptly stop.is also discussed. Pt is also encouraged to set specific goals in number of cigarettes used daily, as well as to set a quit date.  Number of cigarettes/cigars currently smoking daily: 10 to 15   Insomnia Uncontrolled Sleep hygiene reviewed and written information offered also. Prescription sent for  medication needed.        Review of Systems     Objective:   Physical Exam        Assessment & Plan:

## 2016-01-06 LAB — HEMOGLOBIN A1C
Hgb A1c MFr Bld: 7.1 % — ABNORMAL HIGH (ref <5.7)
Mean Plasma Glucose: 157 mg/dL — ABNORMAL HIGH (ref <117)

## 2016-01-06 LAB — HEPATITIS C ANTIBODY: HCV Ab: NEGATIVE

## 2016-01-06 LAB — MICROALBUMIN / CREATININE URINE RATIO
Creatinine, Urine: 57 mg/dL (ref 20–370)
Microalb Creat Ratio: 4 mcg/mg creat (ref <30)
Microalb, Ur: 0.2 mg/dL

## 2016-01-06 LAB — HIV ANTIBODY (ROUTINE TESTING W REFLEX): HIV 1&2 Ab, 4th Generation: NONREACTIVE

## 2016-01-07 ENCOUNTER — Encounter: Payer: Self-pay | Admitting: Gastroenterology

## 2016-01-18 ENCOUNTER — Telehealth: Payer: Self-pay | Admitting: Family Medicine

## 2016-01-18 DIAGNOSIS — I10 Essential (primary) hypertension: Secondary | ICD-10-CM

## 2016-01-18 DIAGNOSIS — E785 Hyperlipidemia, unspecified: Secondary | ICD-10-CM

## 2016-01-18 NOTE — Addendum Note (Signed)
Addended by: Eual Fines on: 01/18/2016 01:32 PM   Modules accepted: Orders

## 2016-01-18 NOTE — Telephone Encounter (Signed)
pls let pt know follow low fay diet and zetia is discontinued , not covered by ins, should do well without it, already on pravachol  Needs rept fasting lipid, cmp and EGFR in 3 to 4 months  pls also send stamped d/c order to pharmacy, I have removed it

## 2016-01-18 NOTE — Telephone Encounter (Signed)
Done

## 2016-01-18 NOTE — Telephone Encounter (Signed)
Patient aware and will make pharmacy aware

## 2016-01-22 ENCOUNTER — Encounter (HOSPITAL_COMMUNITY): Payer: Self-pay | Admitting: *Deleted

## 2016-01-22 ENCOUNTER — Inpatient Hospital Stay (HOSPITAL_COMMUNITY)
Admission: EM | Admit: 2016-01-22 | Discharge: 2016-01-26 | DRG: 192 | Disposition: A | Discharge status: Home / Self Care | Payer: Commercial Managed Care - HMO | Attending: Internal Medicine | Admitting: Internal Medicine

## 2016-01-22 ENCOUNTER — Emergency Department (HOSPITAL_COMMUNITY): Payer: Commercial Managed Care - HMO

## 2016-01-22 DIAGNOSIS — Z720 Tobacco use: Secondary | ICD-10-CM | POA: Diagnosis not present

## 2016-01-22 DIAGNOSIS — E118 Type 2 diabetes mellitus with unspecified complications: Secondary | ICD-10-CM

## 2016-01-22 DIAGNOSIS — E1169 Type 2 diabetes mellitus with other specified complication: Secondary | ICD-10-CM

## 2016-01-22 DIAGNOSIS — T380X5A Adverse effect of glucocorticoids and synthetic analogues, initial encounter: Secondary | ICD-10-CM | POA: Diagnosis present

## 2016-01-22 DIAGNOSIS — J449 Chronic obstructive pulmonary disease, unspecified: Secondary | ICD-10-CM | POA: Diagnosis present

## 2016-01-22 DIAGNOSIS — Z841 Family history of disorders of kidney and ureter: Secondary | ICD-10-CM

## 2016-01-22 DIAGNOSIS — Z8249 Family history of ischemic heart disease and other diseases of the circulatory system: Secondary | ICD-10-CM

## 2016-01-22 DIAGNOSIS — Z6833 Body mass index (BMI) 33.0-33.9, adult: Secondary | ICD-10-CM

## 2016-01-22 DIAGNOSIS — Z833 Family history of diabetes mellitus: Secondary | ICD-10-CM

## 2016-01-22 DIAGNOSIS — I10 Essential (primary) hypertension: Secondary | ICD-10-CM | POA: Diagnosis present

## 2016-01-22 DIAGNOSIS — F17210 Nicotine dependence, cigarettes, uncomplicated: Secondary | ICD-10-CM | POA: Diagnosis present

## 2016-01-22 DIAGNOSIS — E669 Obesity, unspecified: Secondary | ICD-10-CM

## 2016-01-22 DIAGNOSIS — E785 Hyperlipidemia, unspecified: Secondary | ICD-10-CM | POA: Diagnosis present

## 2016-01-22 DIAGNOSIS — J441 Chronic obstructive pulmonary disease with (acute) exacerbation: Secondary | ICD-10-CM | POA: Diagnosis not present

## 2016-01-22 DIAGNOSIS — E119 Type 2 diabetes mellitus without complications: Secondary | ICD-10-CM | POA: Diagnosis present

## 2016-01-22 DIAGNOSIS — Z823 Family history of stroke: Secondary | ICD-10-CM

## 2016-01-22 LAB — CBC WITH DIFFERENTIAL/PLATELET
Basophils Absolute: 0 10*3/uL (ref 0.0–0.1)
Basophils Relative: 0 %
Eosinophils Absolute: 1.1 10*3/uL — ABNORMAL HIGH (ref 0.0–0.7)
Eosinophils Relative: 11 %
HCT: 34.9 % — ABNORMAL LOW (ref 39.0–52.0)
Hemoglobin: 11.7 g/dL — ABNORMAL LOW (ref 13.0–17.0)
Lymphocytes Relative: 36 %
Lymphs Abs: 3.6 10*3/uL (ref 0.7–4.0)
MCH: 29.1 pg (ref 26.0–34.0)
MCHC: 33.5 g/dL (ref 30.0–36.0)
MCV: 86.8 fL (ref 78.0–100.0)
Monocytes Absolute: 1 10*3/uL (ref 0.1–1.0)
Monocytes Relative: 10 %
Neutro Abs: 4.2 10*3/uL (ref 1.7–7.7)
Neutrophils Relative %: 43 %
Platelets: 255 10*3/uL (ref 150–400)
RBC: 4.02 MIL/uL — ABNORMAL LOW (ref 4.22–5.81)
RDW: 14.9 % (ref 11.5–15.5)
WBC: 9.9 10*3/uL (ref 4.0–10.5)

## 2016-01-22 LAB — BASIC METABOLIC PANEL
Anion gap: 12 (ref 5–15)
BUN: 20 mg/dL (ref 6–20)
CO2: 22 mmol/L (ref 22–32)
Calcium: 9.7 mg/dL (ref 8.9–10.3)
Chloride: 97 mmol/L — ABNORMAL LOW (ref 101–111)
Creatinine, Ser: 1.63 mg/dL — ABNORMAL HIGH (ref 0.61–1.24)
GFR calc Af Amer: 49 mL/min — ABNORMAL LOW (ref >60)
GFR calc non Af Amer: 42 mL/min — ABNORMAL LOW (ref >60)
Glucose, Bld: 118 mg/dL — ABNORMAL HIGH (ref 65–99)
Potassium: 3.9 mmol/L (ref 3.5–5.1)
Sodium: 131 mmol/L — ABNORMAL LOW (ref 135–145)

## 2016-01-22 LAB — TROPONIN I: Troponin I: <0.03 ng/mL (ref <0.031)

## 2016-01-22 MED ORDER — IPRATROPIUM BROMIDE 0.02 % IN SOLN
0.5000 mg | Freq: Once | RESPIRATORY_TRACT | Status: AC
  Administered 2016-01-22: 0.5 mg via RESPIRATORY_TRACT

## 2016-01-22 MED ORDER — ALBUTEROL (5 MG/ML) CONTINUOUS INHALATION SOLN
10.0000 mg/h | INHALATION_SOLUTION | RESPIRATORY_TRACT | Status: DC
  Administered 2016-01-22: 10 mg/h via RESPIRATORY_TRACT

## 2016-01-22 NOTE — ED Provider Notes (Signed)
CSN: [id number]     Arrival date & time 01/22/16  2031 History   First MD Initiated Contact with Patient 01/22/16 2039     Chief Complaint  Patient presents with  . Shortness of Breath      HPI Pt was seen at 2035.  Per pt, c/o gradual onset and worsening of persistent cough, wheezing and SOB for the past 2 days, worse today. Has been using home MDI and nebs with transient relief. EMS gave short neb and IV solumedrol en route. Pt's wife states pt "was doing good until he started smoking again."  Denies CP/palpitations, no back pain, no abd pain, no N/V/D, no fevers, no rash.     Past Medical History  Diagnosis Date  . Nicotine addiction   . Schizophrenia ([place])   . Hyperlipidemia   . Obesity   . Diabetes mellitus   . Arthritis   . Acute respiratory failure with hypoxia ([place])   . Acute kidney injury ([place])   . Multiple lung nodules on CT 04/02/2015  . Depression   . COPD (chronic obstructive pulmonary disease) ([place])   . Diabetes mellitus without complication ([place])   . Hypertension   . Hypercholesterolemia    Past Surgical History  Procedure Laterality Date  . Colonoscopy    . Colonoscopy N/A 04/01/2013    Procedure: COLONOSCOPY;  Surgeon: Danie Binder, MD;  [facility]: [facility];  Service: Endoscopy;  Laterality: N/A;  10:00 AM-moved to [place] notified pt  . Cataract extraction w/phaco Left 11/20/2013    Procedure: CATARACT EXTRACTION PHACO AND INTRAOCULAR LENS PLACEMENT (IOC);  Surgeon: Tonny Branch, MD;  Location: [facility];  Service: Ophthalmology;  Laterality: Left;  CDE:10.26  . Cataract extraction w/phaco Right 12/08/2013    Procedure: RIGHT EYE CATARACT EXTRACTION PHACO AND INTRAOCULAR LENS PLACEMENT ;  Surgeon: Tonny Branch, MD;  Location: [facility];  Service: Ophthalmology;  Laterality: Right;  CDE 12.38  . Eye surgery Left 11/2013    cataract extraction  . Shoulder surgery     Family History  Problem Relation Age of Onset  . Diabetes Mother   . Hypertension Mother    . Stroke Mother   . Diabetes Sister   . Heart disease Sister   . Kidney disease Father    Social History  Substance Use Topics  . Smoking status: Current Every Day Smoker -- 1.00 packs/day    Types: Cigarettes  . Smokeless tobacco: None     Comment: quit smoking before he went to the hospital   . Alcohol Use: No    Review of Systems ROS: Statement: All systems negative except as marked or noted in the HPI; Constitutional: Negative for fever and chills. ; ; Eyes: Negative for eye pain, redness and discharge. ; ; ENMT: Negative for ear pain, hoarseness, nasal congestion, sinus pressure and sore throat. ; ; Cardiovascular: Negative for chest pain, palpitations, diaphoresis, and peripheral edema. ; ; Respiratory: +cough, wheezing, SOB. Negative for stridor. ; ; Gastrointestinal: Negative for nausea, vomiting, diarrhea, abdominal pain, blood in stool, hematemesis, jaundice and rectal bleeding. . ; ; Genitourinary: Negative for dysuria, flank pain and hematuria. ; ; Musculoskeletal: Negative for back pain and neck pain. Negative for swelling and trauma.; ; Skin: Negative for pruritus, rash, abrasions, blisters, bruising and skin lesion.; ; Neuro: Negative for headache, lightheadedness and neck stiffness. Negative for weakness, altered level of consciousness , altered mental status, extremity weakness, paresthesias, involuntary movement, seizure and syncope.  Allergies  Sertraline hcl; Wellbutrin; Wellbutrin; and Zoloft  Home Medications   Prior to Admission medications   Medication Sig Start Date End Date Taking? Authorizing Provider  albuterol (PROVENTIL HFA;VENTOLIN HFA) 108 (90 BASE) MCG/ACT inhaler Inhale 2 puffs into the lungs every 6 (six) hours as needed for wheezing or shortness of breath. 07/29/15   Ezequiel Essex, MD  amLODipine (NORVASC) 10 MG tablet Take 1 tablet (10 mg total) by mouth daily. 01/05/16   Fayrene Helper, MD  aspirin EC 81 MG tablet Take 81 mg by mouth daily.     Historical Provider, MD  budesonide-formoterol (SYMBICORT) 80-4.5 MCG/ACT inhaler INHALE 2 PUFFS INTO LUNGS TWICE DAILY. Patient taking differently: Inhale 2 puffs into the lungs 2 (two) times daily.  08/30/15   Fayrene Helper, MD  busPIRone (BUSPAR) 15 MG tablet Take half a pill twice a day 11/24/15   Cloria Spring, MD  glipiZIDE (GLUCOTROL XL) 10 MG 24 hr tablet Take 1 tablet (10 mg total) by mouth daily. 01/05/16   Fayrene Helper, MD  ipratropium-albuterol (DUONEB) 0.5-2.5 (3) MG/3ML SOLN Take 3 mLs by nebulization every 4 (four) hours as needed. Patient taking differently: Take 3 mLs by nebulization every 4 (four) hours as needed (for shortness of breath).  08/30/15   Fayrene Helper, MD  metFORMIN (GLUCOPHAGE) 1000 MG tablet TAKE 1 TABLET BY MOUTH TWICE DAILY WITH FOOD FOR DIABETES. 12/23/15   Fayrene Helper, MD  polyethylene glycol powder (GLYCOLAX/MIRALAX) powder MIX 1 CAPFUL (17G) IN 8 OUNCES OF JUICE/WATER AND DRINK ONCE DAILY. 12/23/15   Fayrene Helper, MD  pravastatin (PRAVACHOL) 40 MG tablet TAKE (1) TABLET BY MOUTH AT BEDTIME FOR CHOLESTEROL. 01/05/16   Fayrene Helper, MD  predniSONE (DELTASONE) 5 MG tablet Take 1 tablet (5 mg total) by mouth daily with breakfast. 11/29/15   Fayrene Helper, MD  risperiDONE (RISPERDAL) 1 MG tablet Take 1 tablet (1 mg total) by mouth at bedtime. Takes 5mg  total every day at bedtime 11/24/15   Cloria Spring, MD  risperidone (RISPERDAL) 4 MG tablet Take 1 tablet (4 mg total) by mouth at bedtime. 11/24/15   Cloria Spring, MD  SPIRIVA HANDIHALER 18 MCG inhalation capsule INHALE 1 CAPSULE AS DIRECTED ONCE A DAY. 02/22/15   Fayrene Helper, MD  traZODone (DESYREL) 50 MG tablet One tablet at bedtime for sleep 01/05/16   Fayrene Helper, MD  triamterene-hydrochlorothiazide (MAXZIDE-25) 37.5-25 MG tablet Take 1 tablet by mouth daily. 01/05/16   Fayrene Helper, MD  valsartan (DIOVAN) 320 MG tablet Take 1 tablet (320 mg total) by mouth  daily. 01/05/16   Fayrene Helper, MD   BP 127/93 mmHg  Pulse 104  Resp 28  SpO2 94% Physical Exam  2040: Physical examination:  Nursing notes reviewed; Vital signs and O2 SAT reviewed;  Constitutional: Well developed, Well nourished, Well hydrated, Uncomfortable appearing.; Head:  Normocephalic, atraumatic; Eyes: EOMI, PERRL, No scleral icterus; ENMT: Mouth and pharynx normal, Mucous membranes moist; Neck: Supple, Full range of motion, No lymphadenopathy; Cardiovascular: Tachycardic rate and rhythm, No gallop; Respiratory: Breath sounds diminished & equal bilaterally, scattered wheezes, audible wheezing. Speaking phrases, sitting upright, tachypneic, non-productive cough.; Chest: Nontender, Movement normal; Abdomen: Soft, Nontender, Nondistended, Normal bowel sounds; Genitourinary: No CVA tenderness; Extremities: Pulses normal, No tenderness, No edema, No calf edema or asymmetry.; Neuro: AA&Ox3, Major CN grossly intact.  Speech clear. No gross focal motor or sensory deficits in extremities.; Skin: Color normal, Warm, Dry.  ED Course  Procedures (including critical care time) Labs Review  Imaging Review  I have personally reviewed and evaluated these images and lab results as part of my medical decision-making.   EKG Interpretation   Date/Time:  Saturday January 22 2016 21:56:47 EST Ventricular Rate:  117 PR Interval:  160 QRS Duration: 93 QT Interval:  336 QTC Calculation: 469 R Axis:   -28 Text Interpretation:  Sinus tachycardia Ventricular premature complex  Borderline left axis deviation Abnormal R-wave progression, late  transition Baseline wander Artifact When compared with ECG of 11/21/2015 No  significant change was found Confirmed by [facility]  MD, Nunzio Cory [phone]) on  01/22/2016 10:37:53 PM      MDM  MDM Reviewed: previous chart, nursing note and vitals Reviewed previous: labs and ECG Interpretation: labs, ECG and x-ray Total time providing critical care: 30-74  minutes. This excludes time spent performing separately reportable procedures and services. Consults: admitting MD     CRITICAL CARE Performed by: Alfonzo Feller Total critical care time: 35 minutes Critical care time was exclusive of separately billable procedures and treating other patients. Critical care was necessary to treat or prevent imminent or life-threatening deterioration. Critical care was time spent personally by me on the following activities: development of treatment plan with patient and/or surrogate as well as nursing, discussions with consultants, evaluation of patient's response to treatment, examination of patient, obtaining history from patient or surrogate, ordering and performing treatments and interventions, ordering and review of laboratory studies, ordering and review of radiographic studies, pulse oximetry and re-evaluation of patient's condition.  Results for orders placed or performed during the hospital encounter of [id number]  Basic metabolic panel  Result Value Ref Range   Sodium 131 (L) 135 - 145 mmol/L   Potassium 3.9 3.5 - 5.1 mmol/L   Chloride 97 (L) 101 - 111 mmol/L   CO2 22 22 - 32 mmol/L   Glucose, Bld 118 (H) 65 - 99 mg/dL   BUN 20 6 - 20 mg/dL   Creatinine, Ser 1.63 (H) 0.61 - 1.24 mg/dL   Calcium 9.7 8.9 - 10.3 mg/dL   GFR calc non Af Amer 42 (L) >60 mL/min   GFR calc Af Amer 49 (L) >60 mL/min   Anion gap 12 5 - 15  Troponin I  Result Value Ref Range   Troponin I <0.03 <0.031 ng/mL  CBC with Differential  Result Value Ref Range   WBC 9.9 4.0 - 10.5 K/uL   RBC 4.02 (L) 4.22 - 5.81 MIL/uL   Hemoglobin 11.7 (L) 13.0 - 17.0 g/dL   HCT 34.9 (L) 39.0 - 52.0 %   MCV 86.8 78.0 - 100.0 fL   MCH 29.1 26.0 - 34.0 pg   MCHC 33.5 30.0 - 36.0 g/dL   RDW 14.9 11.5 - 15.5 %   Platelets 255 150 - 400 K/uL   Neutrophils Relative % 43 %   Neutro Abs 4.2 1.7 - 7.7 K/uL   Lymphocytes Relative 36 %   Lymphs Abs 3.6 0.7 - 4.0 K/uL   Monocytes Relative  10 %   Monocytes Absolute 1.0 0.1 - 1.0 K/uL   Eosinophils Relative 11 %   Eosinophils Absolute 1.1 (H) 0.0 - 0.7 K/uL   Basophils Relative 0 %   Basophils Absolute 0.0 0.0 - 0.1 K/uL   Dg Chest Port 1 View 01/22/2016  CLINICAL DATA:  Progressive shortness of breath for days. Wheezing. Productive cough. EXAM: PORTABLE CHEST 1 VIEW COMPARISON:  11/21/2015 FINDINGS: The cardiomediastinal contours are  unchanged. Mild motion, allowing for this lungs are clear. Pulmonary vasculature is normal. No consolidation, pleural effusion, or pneumothorax. No acute osseous abnormalities are seen. IMPRESSION: No acute process, allowing for motion limitations. Electronically Signed   By: Jeb Levering M.D.   On: 01/22/2016 21:12    2230: On arrival: pt sitting upright, tachypneic, tachycardic, Sats 94 % R/A, lungs diminished, wheezing bilat with audible wheezing. IV solumedrol and hour long neb started. After neb: pt appears more comfortable, sitting off side of stretcher, less tachypneic, Sats 96 % on O2 2L N/C, lungs continue diminished with intermittent non-productive cough. Dx and testing d/w pt and family.  Questions answered.  Verb understanding, agreeable to admit. T/C to [facility] [place] Comment, case discussed, including:  HPI, pertinent PM/SHx, VS/PE, dx testing, ED course and treatment:  Agreeable to admit, requests he will come to the ED for evaluation for admission.   Francine Graven, DO 01/23/16 2135

## 2016-01-22 NOTE — ED Notes (Signed)
Pt c/o increasing sob over the past few days, worse today, wheezing noted upon ems arrival to pt, pt received 5 mg albuterol and 0.5 mg atrovent, 125 mg solu medrol while enroute with ems,

## 2016-01-22 NOTE — H&P (Signed)
[facility] History and Physical  Matthew Molina [id number] DOB: 1951/02/07    PCP:   [name], MD   Chief Complaint: SOB  HPI: Matthew Molina is an 65 y.o. male  with a hx of COPD, tobacco abuse, HLD, DM, and HTN that presented with SOB that began about 1 week ago. Patient reports worsening SOB with an associated cough and wheezing. He has been using his nebulizer with temporary relief at home. He denies any CP, back pain, n/v/d, or fever. Patient reports that he smokes about 1ppd. While in the ED, labs revealed a creatinine 1.63, glucose 118, negative troponin and unremarkable CBC. CXR was negative for an acute process.  Hospitalist was asked to admit him for COPD exacerbation.   Rewiew of Systems:  Constitutional: Negative for malaise, fever and chills. No significant weight loss or weight gain Eyes: Negative for eye pain, redness and discharge, diplopia, visual changes, or flashes of light. ENMT: Negative for ear pain, hoarseness, nasal congestion, sinus pressure and sore throat. No headaches; tinnitus, drooling, or problem swallowing. Cardiovascular: Negative for chest pain, palpitations, diaphoresis, dyspnea and peripheral edema. ; No orthopnea, PND Respiratory: Positive for SOB, cough, and wheezing. Negative for  Hemoptysis and stridor. No pleuritic chestpain. Gastrointestinal: Negative for nausea, vomiting, diarrhea, constipation, abdominal pain, melena, blood in stool, hematemesis, jaundice and rectal bleeding.    Genitourinary: Negative for frequency, dysuria, incontinence,flank pain and hematuria; Musculoskeletal: Negative for back pain and neck pain. Negative for swelling and trauma.;  Skin: . Negative for pruritus, rash, abrasions, bruising and skin lesion.; ulcerations Neuro: Negative for headache, lightheadedness and neck stiffness. Negative for weakness, altered level of consciousness , altered mental status, extremity weakness, burning feet, involuntary  movement, seizure and syncope.  Psych: negative for anxiety, depression, insomnia, tearfulness, panic attacks, hallucinations, paranoia, suicidal or homicidal ideation    Past Medical History  Diagnosis Date  . Nicotine addiction   . Schizophrenia ([place])   . Hyperlipidemia   . Obesity   . Diabetes mellitus   . Arthritis   . Acute respiratory failure with hypoxia ([place])   . Acute kidney injury ([place])   . Multiple lung nodules on CT 04/02/2015  . Depression   . COPD (chronic obstructive pulmonary disease) ([place])   . Diabetes mellitus without complication ([place])   . Hypertension   . Hypercholesterolemia     Past Surgical History  Procedure Laterality Date  . Colonoscopy    . Colonoscopy N/A 04/01/2013    Procedure: COLONOSCOPY;  Surgeon: Danie Binder, MD;  [facility];  Service: Endoscopy;  Laterality: N/A;  10:00 AM-moved to [place] notified pt  . Cataract extraction w/phaco Left 11/20/2013    Procedure: CATARACT EXTRACTION PHACO AND INTRAOCULAR LENS PLACEMENT (IOC);  Surgeon: Tonny Branch, MD;  Location: [facility];  Service: Ophthalmology;  Laterality: Left;  CDE:10.26  . Cataract extraction w/phaco Right 12/08/2013    Procedure: RIGHT EYE CATARACT EXTRACTION PHACO AND INTRAOCULAR LENS PLACEMENT ;  Surgeon: Tonny Branch, MD;  Location: [facility];  Service: Ophthalmology;  Laterality: Right;  CDE 12.38  . Eye surgery Left 11/2013    cataract extraction  . Shoulder surgery      Medications:  HOME MEDS: Prior to Admission medications   Medication Sig Start Date End Date Taking? Authorizing Provider  albuterol (PROVENTIL HFA;VENTOLIN HFA) 108 (90 BASE) MCG/ACT inhaler Inhale 2 puffs into the lungs every 6 (six) hours as needed for wheezing or shortness of breath. 07/29/15  Ezequiel Essex, MD  amLODipine (NORVASC) 10 MG tablet Take 1 tablet (10 mg total) by mouth daily. 01/05/16   Fayrene Helper, MD  aspirin EC 81 MG tablet Take 81 mg by mouth daily.    Historical Provider,  MD  budesonide-formoterol (SYMBICORT) 80-4.5 MCG/ACT inhaler INHALE 2 PUFFS INTO LUNGS TWICE DAILY. Patient taking differently: Inhale 2 puffs into the lungs 2 (two) times daily.  08/30/15   Fayrene Helper, MD  busPIRone (BUSPAR) 15 MG tablet Take half a pill twice a day 11/24/15   Cloria Spring, MD  glipiZIDE (GLUCOTROL XL) 10 MG 24 hr tablet Take 1 tablet (10 mg total) by mouth daily. 01/05/16   Fayrene Helper, MD  ipratropium-albuterol (DUONEB) 0.5-2.5 (3) MG/3ML SOLN Take 3 mLs by nebulization every 4 (four) hours as needed. Patient taking differently: Take 3 mLs by nebulization every 4 (four) hours as needed (for shortness of breath).  08/30/15   Fayrene Helper, MD  metFORMIN (GLUCOPHAGE) 1000 MG tablet TAKE 1 TABLET BY MOUTH TWICE DAILY WITH FOOD FOR DIABETES. 12/23/15   Fayrene Helper, MD  polyethylene glycol powder (GLYCOLAX/MIRALAX) powder MIX 1 CAPFUL (17G) IN 8 OUNCES OF JUICE/WATER AND DRINK ONCE DAILY. 12/23/15   Fayrene Helper, MD  pravastatin (PRAVACHOL) 40 MG tablet TAKE (1) TABLET BY MOUTH AT BEDTIME FOR CHOLESTEROL. 01/05/16   Fayrene Helper, MD  predniSONE (DELTASONE) 5 MG tablet Take 1 tablet (5 mg total) by mouth daily with breakfast. 11/29/15   Fayrene Helper, MD  risperiDONE (RISPERDAL) 1 MG tablet Take 1 tablet (1 mg total) by mouth at bedtime. Takes 5mg  total every day at bedtime 11/24/15   Cloria Spring, MD  risperidone (RISPERDAL) 4 MG tablet Take 1 tablet (4 mg total) by mouth at bedtime. 11/24/15   Cloria Spring, MD  SPIRIVA HANDIHALER 18 MCG inhalation capsule INHALE 1 CAPSULE AS DIRECTED ONCE A DAY. 02/22/15   Fayrene Helper, MD  traZODone (DESYREL) 50 MG tablet One tablet at bedtime for sleep 01/05/16   Fayrene Helper, MD  triamterene-hydrochlorothiazide (MAXZIDE-25) 37.5-25 MG tablet Take 1 tablet by mouth daily. 01/05/16   Fayrene Helper, MD  valsartan (DIOVAN) 320 MG tablet Take 1 tablet (320 mg total) by mouth daily. 01/05/16   Fayrene Helper, MD     Allergies:  Allergies  Allergen Reactions  . Sertraline Hcl     Stomach upset/pain  . Wellbutrin [Bupropion] Other (See Comments)    Makes stomach hurt  . Wellbutrin [Bupropion]   . Zoloft [Sertraline Hcl]     Social History:   reports that he has been smoking Cigarettes.  He has been smoking about 1.00 pack per day. He does not have any smokeless tobacco history on file. He reports that he does not drink alcohol or use illicit drugs.  Family History: Family History  Problem Relation Age of Onset  . Diabetes Mother   . Hypertension Mother   . Stroke Mother   . Diabetes Sister   . Heart disease Sister   . Kidney disease Father      Physical Exam: Filed Vitals:   01/22/16 2040 01/22/16 2052  BP: 127/93   Pulse: 104   Resp: 28   SpO2: 96% 94%   Blood pressure 127/93, pulse 104, resp. rate 28, SpO2 94 %.  GEN:  Pleasant. Patient lying in the stretcher in no acute distress; cooperative with exam. PSYCH:  alert and oriented x4; does not appear  anxious or depressed; affect is appropriate. HEENT: Mucous membranes pink and anicteric; PERRLA; EOM intact; no cervical lymphadenopathy nor thyromegaly or carotid bruit; no JVD; There were no stridor. Neck is very supple. Breasts:: Not examined CHEST WALL: No tenderness  CHEST: Wheezing. No crackles. Mild dyspnea. No respiratory distress.  HEART: Regular rate and rhythm.  There are no murmur, rub, or gallops.   BACK: No kyphosis or scoliosis; no CVA tenderness ABDOMEN: soft and non-tender; no masses, no organomegaly, normal abdominal bowel sounds; no pannus; no intertriginous candida. There is no rebound and no distention. Rectal Exam: Not done EXTREMITIES: No bone or joint deformity; age-appropriate arthropathy of the hands and knees; no edema; no ulcerations.  There is no calf tenderness. Genitalia: not examined PULSES: 2+ and symmetric SKIN: Normal hydration no rash or ulceration CNS: Cranial nerves 2-12  grossly intact no focal lateralizing neurologic deficit.  Speech is fluent; uvula elevated with phonation, facial symmetry and tongue midline. DTR are normal bilaterally, cerebella exam is intact, barbinski is negative and strengths are equaled bilaterally.  No sensory loss.   Labs on Admission:  Basic Metabolic Panel:  Recent Labs Lab 01/22/16 2042  NA 131*  K 3.9  CL 97*  CO2 22  GLUCOSE 118*  BUN 20  CREATININE 1.63*  CALCIUM 9.7    CBC:  Recent Labs Lab 01/22/16 2042  WBC 9.9  NEUTROABS 4.2  HGB 11.7*  HCT 34.9*  MCV 86.8  PLT 255   Cardiac Enzymes:  Recent Labs Lab 01/22/16 2042  TROPONINI <0.03     Radiological Exams on Admission: Dg Chest Port 1 View  01/22/2016  CLINICAL DATA:  Progressive shortness of breath for days. Wheezing. Productive cough. EXAM: PORTABLE CHEST 1 VIEW COMPARISON:  11/21/2015 FINDINGS: The cardiomediastinal contours are unchanged. Mild motion, allowing for this lungs are clear. Pulmonary vasculature is normal. No consolidation, pleural effusion, or pneumothorax. No acute osseous abnormalities are seen. IMPRESSION: No acute process, allowing for motion limitations. Electronically Signed   By: Jeb Levering M.D.   On: 01/22/2016 21:12    EKG: Independently reviewed.     Assessment/Plan   COPD exacerbation Essential Hypertension Tobacco Abuse Diabetes Mellitus type 2 Hyperlipidemia  PLAN: Patient will be admitted for a COPD exacerbation. Will start on IV Solumedrol, bronchodilators, IV Levaquin, and supplemental O2. For his HTN we will continue his home medications.  He will be continued on Glipizide for his DM as well as started on SSI. Will hold Metformin due to elevated creatinine.  Will continue his statin for HLD. Creatinine of 1.6 will be monitored given patient has a hx of CKD and creatinine from 2 months ago was noted to be 1.7. Patient was counseled on the importance of tobacco cessation. He will be provided a nicotine patch  during the course of his admission.   Other plans as per orders.  Code Status: Full   Orvan Falconer MD. FACP [facility] Pager [phone] 7pm to 7am.  01/22/2016, 10:34 PM  By signing my name below, I, Rosalie Doctor, attest that this documentation has been prepared under the direction and in the presence of Orvan Falconer MD Electronically Signed: Rosalie Doctor, Scribe. 01/22/2016

## 2016-01-23 DIAGNOSIS — J441 Chronic obstructive pulmonary disease with (acute) exacerbation: Secondary | ICD-10-CM | POA: Diagnosis present

## 2016-01-23 DIAGNOSIS — E669 Obesity, unspecified: Secondary | ICD-10-CM | POA: Diagnosis present

## 2016-01-23 DIAGNOSIS — Z833 Family history of diabetes mellitus: Secondary | ICD-10-CM | POA: Diagnosis not present

## 2016-01-23 DIAGNOSIS — T380X5A Adverse effect of glucocorticoids and synthetic analogues, initial encounter: Secondary | ICD-10-CM | POA: Diagnosis present

## 2016-01-23 DIAGNOSIS — I10 Essential (primary) hypertension: Secondary | ICD-10-CM | POA: Diagnosis present

## 2016-01-23 DIAGNOSIS — Z6833 Body mass index (BMI) 33.0-33.9, adult: Secondary | ICD-10-CM | POA: Diagnosis not present

## 2016-01-23 DIAGNOSIS — Z823 Family history of stroke: Secondary | ICD-10-CM | POA: Diagnosis not present

## 2016-01-23 DIAGNOSIS — Z8249 Family history of ischemic heart disease and other diseases of the circulatory system: Secondary | ICD-10-CM | POA: Diagnosis not present

## 2016-01-23 DIAGNOSIS — Z841 Family history of disorders of kidney and ureter: Secondary | ICD-10-CM | POA: Diagnosis not present

## 2016-01-23 DIAGNOSIS — E785 Hyperlipidemia, unspecified: Secondary | ICD-10-CM | POA: Diagnosis present

## 2016-01-23 DIAGNOSIS — F17210 Nicotine dependence, cigarettes, uncomplicated: Secondary | ICD-10-CM | POA: Diagnosis present

## 2016-01-23 DIAGNOSIS — E119 Type 2 diabetes mellitus without complications: Secondary | ICD-10-CM | POA: Diagnosis present

## 2016-01-23 LAB — CBC
HCT: 35.4 % — ABNORMAL LOW (ref 39.0–52.0)
Hemoglobin: 11.6 g/dL — ABNORMAL LOW (ref 13.0–17.0)
MCH: 28.5 pg (ref 26.0–34.0)
MCHC: 32.8 g/dL (ref 30.0–36.0)
MCV: 87 fL (ref 78.0–100.0)
Platelets: 267 10*3/uL (ref 150–400)
RBC: 4.07 MIL/uL — ABNORMAL LOW (ref 4.22–5.81)
RDW: 14.9 % (ref 11.5–15.5)
WBC: 10.1 10*3/uL (ref 4.0–10.5)

## 2016-01-23 LAB — COMPREHENSIVE METABOLIC PANEL
ALT: 27 U/L (ref 17–63)
AST: 38 U/L (ref 15–41)
Albumin: 4.5 g/dL (ref 3.5–5.0)
Alkaline Phosphatase: 40 U/L (ref 38–126)
Anion gap: 15 (ref 5–15)
BUN: 21 mg/dL — ABNORMAL HIGH (ref 6–20)
CO2: 18 mmol/L — ABNORMAL LOW (ref 22–32)
Calcium: 9.9 mg/dL (ref 8.9–10.3)
Chloride: 92 mmol/L — ABNORMAL LOW (ref 101–111)
Creatinine, Ser: 1.73 mg/dL — ABNORMAL HIGH (ref 0.61–1.24)
GFR calc Af Amer: 46 mL/min — ABNORMAL LOW (ref >60)
GFR calc non Af Amer: 39 mL/min — ABNORMAL LOW (ref >60)
Glucose, Bld: 272 mg/dL — ABNORMAL HIGH (ref 65–99)
Potassium: 4.6 mmol/L (ref 3.5–5.1)
Sodium: 125 mmol/L — ABNORMAL LOW (ref 135–145)
Total Bilirubin: 0.8 mg/dL (ref 0.3–1.2)
Total Protein: 8 g/dL (ref 6.5–8.1)

## 2016-01-23 LAB — GLUCOSE, CAPILLARY
Glucose-Capillary: 181 mg/dL — ABNORMAL HIGH (ref 65–99)
Glucose-Capillary: 202 mg/dL — ABNORMAL HIGH (ref 65–99)
Glucose-Capillary: 212 mg/dL — ABNORMAL HIGH (ref 65–99)
Glucose-Capillary: 212 mg/dL — ABNORMAL HIGH (ref 65–99)
Glucose-Capillary: 249 mg/dL — ABNORMAL HIGH (ref 65–99)

## 2016-01-23 MED ORDER — ALBUTEROL SULFATE (2.5 MG/3ML) 0.083% IN NEBU
2.5000 mg | INHALATION_SOLUTION | Freq: Four times a day (QID) | RESPIRATORY_TRACT | Status: DC
  Administered 2016-01-23 (×2): 2.5 mg via RESPIRATORY_TRACT
  Filled 2016-01-23: qty 3

## 2016-01-23 MED ORDER — IRBESARTAN 300 MG PO TABS
300.0000 mg | ORAL_TABLET | Freq: Every day | ORAL | Status: DC
  Administered 2016-01-23 – 2016-01-26 (×4): 300 mg via ORAL
  Filled 2016-01-23 (×4): qty 1

## 2016-01-23 MED ORDER — LEVOFLOXACIN IN D5W 500 MG/100ML IV SOLN
500.0000 mg | INTRAVENOUS | Status: DC
  Administered 2016-01-23 – 2016-01-26 (×4): 500 mg via INTRAVENOUS
  Filled 2016-01-23 (×4): qty 100

## 2016-01-23 MED ORDER — METHYLPREDNISOLONE SODIUM SUCC 125 MG IJ SOLR
125.0000 mg | Freq: Once | INTRAMUSCULAR | Status: DC
  Filled 2016-01-23 (×2): qty 2

## 2016-01-23 MED ORDER — BENZONATATE 100 MG PO CAPS
100.0000 mg | ORAL_CAPSULE | Freq: Three times a day (TID) | ORAL | Status: DC | PRN
  Administered 2016-01-23 – 2016-01-26 (×6): 100 mg via ORAL
  Filled 2016-01-23 (×8): qty 1

## 2016-01-23 MED ORDER — NICOTINE 14 MG/24HR TD PT24
14.0000 mg | MEDICATED_PATCH | Freq: Every day | TRANSDERMAL | Status: DC
  Administered 2016-01-23 – 2016-01-26 (×4): 14 mg via TRANSDERMAL
  Filled 2016-01-23 (×5): qty 1

## 2016-01-23 MED ORDER — GLIPIZIDE ER 5 MG PO TB24
10.0000 mg | ORAL_TABLET | Freq: Every day | ORAL | Status: DC
  Administered 2016-01-23 – 2016-01-26 (×4): 10 mg via ORAL
  Filled 2016-01-23 (×4): qty 2

## 2016-01-23 MED ORDER — METHYLPREDNISOLONE SODIUM SUCC 40 MG IJ SOLR
40.0000 mg | Freq: Four times a day (QID) | INTRAMUSCULAR | Status: DC
  Administered 2016-01-23 – 2016-01-25 (×10): 40 mg via INTRAVENOUS
  Filled 2016-01-23 (×9): qty 1

## 2016-01-23 MED ORDER — INSULIN ASPART 100 UNIT/ML ~~LOC~~ SOLN
0.0000 [IU] | Freq: Every day | SUBCUTANEOUS | Status: DC
  Administered 2016-01-23: 2 [IU] via SUBCUTANEOUS
  Administered 2016-01-24: 3 [IU] via SUBCUTANEOUS
  Administered 2016-01-25: 2 [IU] via SUBCUTANEOUS

## 2016-01-23 MED ORDER — TIOTROPIUM BROMIDE MONOHYDRATE 18 MCG IN CAPS
18.0000 ug | ORAL_CAPSULE | Freq: Every day | RESPIRATORY_TRACT | Status: DC
  Administered 2016-01-23 – 2016-01-24 (×2): 18 ug via RESPIRATORY_TRACT
  Filled 2016-01-23: qty 5

## 2016-01-23 MED ORDER — PRAVASTATIN SODIUM 40 MG PO TABS
40.0000 mg | ORAL_TABLET | Freq: Every day | ORAL | Status: DC
  Administered 2016-01-23 – 2016-01-26 (×4): 40 mg via ORAL
  Filled 2016-01-23: qty 1
  Filled 2016-01-23: qty 4
  Filled 2016-01-23 (×2): qty 1

## 2016-01-23 MED ORDER — ONDANSETRON HCL 4 MG/2ML IJ SOLN: 4.0000 mg | Freq: Four times a day (QID) | INTRAMUSCULAR | Status: DC | PRN

## 2016-01-23 MED ORDER — AMLODIPINE BESYLATE 5 MG PO TABS
10.0000 mg | ORAL_TABLET | Freq: Every day | ORAL | Status: DC
Start: 2016-01-23 — End: 2016-01-26
  Administered 2016-01-23 – 2016-01-26 (×4): 10 mg via ORAL
  Filled 2016-01-23 (×4): qty 2

## 2016-01-23 MED ORDER — ENOXAPARIN SODIUM 60 MG/0.6ML ~~LOC~~ SOLN
60.0000 mg | SUBCUTANEOUS | Status: DC
  Administered 2016-01-23 – 2016-01-26 (×4): 60 mg via SUBCUTANEOUS
  Filled 2016-01-23 (×4): qty 0.6

## 2016-01-23 MED ORDER — ONDANSETRON HCL 4 MG PO TABS: 4.0000 mg | ORAL_TABLET | Freq: Four times a day (QID) | ORAL | Status: DC | PRN

## 2016-01-23 MED ORDER — BUDESONIDE-FORMOTEROL FUMARATE 80-4.5 MCG/ACT IN AERO
2.0000 | INHALATION_SPRAY | Freq: Two times a day (BID) | RESPIRATORY_TRACT | Status: DC
  Administered 2016-01-23 – 2016-01-26 (×7): 2 via RESPIRATORY_TRACT
  Filled 2016-01-23: qty 6.9

## 2016-01-23 MED ORDER — RISPERIDONE 1 MG PO TABS
4.0000 mg | ORAL_TABLET | Freq: Every day | ORAL | Status: DC
  Administered 2016-01-23 – 2016-01-25 (×4): 4 mg via ORAL
  Filled 2016-01-23 (×4): qty 4

## 2016-01-23 MED ORDER — TRIAMTERENE-HCTZ 37.5-25 MG PO TABS
1.0000 | ORAL_TABLET | Freq: Every day | ORAL | Status: DC
  Administered 2016-01-23 – 2016-01-26 (×4): 1 via ORAL
  Filled 2016-01-23 (×4): qty 1

## 2016-01-23 MED ORDER — ALBUTEROL SULFATE (2.5 MG/3ML) 0.083% IN NEBU
2.5000 mg | INHALATION_SOLUTION | Freq: Four times a day (QID) | RESPIRATORY_TRACT | Status: DC
  Filled 2016-01-23: qty 3

## 2016-01-23 MED ORDER — BUSPIRONE HCL 5 MG PO TABS
15.0000 mg | ORAL_TABLET | Freq: Two times a day (BID) | ORAL | Status: DC
  Administered 2016-01-23 – 2016-01-26 (×8): 15 mg via ORAL
  Filled 2016-01-23 (×8): qty 3

## 2016-01-23 MED ORDER — TRAZODONE HCL 50 MG PO TABS
50.0000 mg | ORAL_TABLET | Freq: Every day | ORAL | Status: DC
  Administered 2016-01-23 – 2016-01-25 (×4): 50 mg via ORAL
  Filled 2016-01-23 (×4): qty 1

## 2016-01-23 MED ORDER — ALBUTEROL SULFATE (2.5 MG/3ML) 0.083% IN NEBU
2.5000 mg | INHALATION_SOLUTION | Freq: Three times a day (TID) | RESPIRATORY_TRACT | Status: DC
  Administered 2016-01-23 – 2016-01-24 (×3): 2.5 mg via RESPIRATORY_TRACT
  Filled 2016-01-23 (×3): qty 3

## 2016-01-23 MED ORDER — ASPIRIN EC 81 MG PO TBEC
81.0000 mg | DELAYED_RELEASE_TABLET | Freq: Every day | ORAL | Status: DC
  Administered 2016-01-23 – 2016-01-26 (×4): 81 mg via ORAL
  Filled 2016-01-23 (×4): qty 1

## 2016-01-23 MED ORDER — INSULIN ASPART 100 UNIT/ML ~~LOC~~ SOLN
0.0000 [IU] | Freq: Three times a day (TID) | SUBCUTANEOUS | Status: DC
  Administered 2016-01-23 (×3): 7 [IU] via SUBCUTANEOUS
  Administered 2016-01-24 (×2): 11 [IU] via SUBCUTANEOUS
  Administered 2016-01-24: 7 [IU] via SUBCUTANEOUS
  Administered 2016-01-25: 11 [IU] via SUBCUTANEOUS
  Administered 2016-01-25 (×2): 7 [IU] via SUBCUTANEOUS
  Administered 2016-01-26: 15 [IU] via SUBCUTANEOUS
  Administered 2016-01-26: 20 [IU] via SUBCUTANEOUS

## 2016-01-23 MED ORDER — ENOXAPARIN SODIUM 40 MG/0.4ML ~~LOC~~ SOLN: 40.0000 mg | SUBCUTANEOUS | Status: DC

## 2016-01-23 NOTE — Progress Notes (Signed)
Continues to cough non-productive.  [employer] given.  Will continue to monitor.

## 2016-01-23 NOTE — Progress Notes (Signed)
[facility] PROGRESS NOTE  Matthew Molina [id number] DOB: 02-25-1951 DOA: 01/22/2016 PCP: [name], MD  HPI/Brief narrative Please see admit h and p from 2/4 for details. Briefly, [phone] with hx of copd, active tobacco abuse, hld, htn, dm who presents to the ED with 1 week hx of worsening sob and wheezing. Patient was admitted for further work up and management for copd exacerbation.  Assessment/Plan: 1. Acute COPD exacerbation 1. Continued wheezing on exam 2. Pt is continued on 3LNC, wean as tolerated 3. Continue scheduled steroids and nebs 2. HTN 1. bp stable 2. Cont to monitor 3. DM2 1. a1c of 7.1 2. Pt's glucose in the 200's, likely difficult to control secondary to concurrent steroids per above 3. Cont SSI coverage 4. HLD 1. Cont statin as toerated 5. Tobacco abuse 1. Cessation done face to face 2. Nicotine patch ordered 6. DVT prophylaxis 1. Lovenox subQ  Code Status: Full Family Communication: Pt in room Disposition Plan: plan d/c when off IV meds, possibly in 2-3 days   Consultants:    Procedures:    Antibiotics: Anti-infectives    Start     Dose/Rate Route Frequency Ordered Stop   01/23/16 0100  levofloxacin (LEVAQUIN) IVPB 500 mg     500 mg 100 mL/hr over 60 Minutes Intravenous Every 24 hours 01/23/16 0012        HPI/Subjective: Feels somewhat better but still coughing/wheezing  Objective: Filed Vitals:   01/23/16 0509 01/23/16 0806 01/23/16 0943 01/23/16 1359  BP: 102/58  131/67   Pulse: 105 95 99   Temp: 98 F (36.7 C)  97.5 F (36.4 C)   TempSrc: Oral  Oral   Resp: 36 18 20   Height:      Weight:      SpO2: 97% 96% 99% 97%    Intake/Output Summary (Last 24 hours) at 01/23/16 1422 Last data filed at 01/23/16 0511  Gross per 24 hour  Intake    480 ml  Output    600 ml  Net   -120 ml   Filed Weights   01/22/16 2352  Weight: 116.3 kg (256 lb 6.3 oz)    Exam:   General:  Awake, in nad  Cardiovascular: regular,  s1, s2  Respiratory: normal resp effort, end-expiratory wheezing throughout  Abdomen: soft,nonidistended  Musculoskeletal: perfused, no clubbing   Data Reviewed: Basic Metabolic Panel:  Recent Labs Lab 01/22/16 2042 01/23/16 0653  NA 131* 125*  K 3.9 4.6  CL 97* 92*  CO2 22 18*  GLUCOSE 118* 272*  BUN 20 21*  CREATININE 1.63* 1.73*  CALCIUM 9.7 9.9   Liver Function Tests:  Recent Labs Lab 01/23/16 0653  AST 38  ALT 27  ALKPHOS 40  BILITOT 0.8  PROT 8.0  ALBUMIN 4.5   No results for input(s): LIPASE, AMYLASE in the last 168 hours. No results for input(s): AMMONIA in the last 168 hours. CBC:  Recent Labs Lab 01/22/16 2042 01/23/16 0653  WBC 9.9 10.1  NEUTROABS 4.2  --   HGB 11.7* 11.6*  HCT 34.9* 35.4*  MCV 86.8 87.0  PLT 255 267   Cardiac Enzymes:  Recent Labs Lab 01/22/16 2042  TROPONINI <0.03   BNP (last 3 results)  Recent Labs  07/29/15 0955 08/18/15 1430  BNP 21.0 12.0    ProBNP (last 3 results) No results for input(s): PROBNP in the last 8760 hours.  CBG:  Recent Labs Lab 01/23/16 0001 01/23/16 0832 01/23/16 1214  GLUCAP 181* 249* 212*  No results found for this or any previous visit (from the past 240 hour(s)).   Studies: Dg Chest Port 1 View  01/22/2016  CLINICAL DATA:  Progressive shortness of breath for days. Wheezing. Productive cough. EXAM: PORTABLE CHEST 1 VIEW COMPARISON:  11/21/2015 FINDINGS: The cardiomediastinal contours are unchanged. Mild motion, allowing for this lungs are clear. Pulmonary vasculature is normal. No consolidation, pleural effusion, or pneumothorax. No acute osseous abnormalities are seen. IMPRESSION: No acute process, allowing for motion limitations. Electronically Signed   By: Jeb Levering M.D.   On: 01/22/2016 21:12    Scheduled Meds: . albuterol  2.5 mg Nebulization TID  . amLODipine  10 mg Oral Daily  . aspirin EC  81 mg Oral Daily  . budesonide-formoterol  2 puff Inhalation BID  .  busPIRone  15 mg Oral BID  . enoxaparin (LOVENOX) injection  60 mg Subcutaneous Q24H  . glipiZIDE  10 mg Oral Daily  . insulin aspart  0-20 Units Subcutaneous TID WC  . insulin aspart  0-5 Units Subcutaneous QHS  . irbesartan  300 mg Oral Daily  . levofloxacin (LEVAQUIN) IV  500 mg Intravenous Q24H  . methylPREDNISolone (SOLU-MEDROL) injection  125 mg Intravenous Once  . methylPREDNISolone (SOLU-MEDROL) injection  40 mg Intravenous Q6H  . nicotine  14 mg Transdermal Daily  . pravastatin  40 mg Oral Daily  . risperidone  4 mg Oral QHS  . tiotropium  18 mcg Inhalation Daily  . traZODone  50 mg Oral QHS  . triamterene-hydrochlorothiazide  1 tablet Oral Daily   Continuous Infusions:   Active Problems:   COPD exacerbation ([place])   Saban Heinlen, [place]  [facility] Pager [phone]. If 7PM-7AM, please contact night-coverage at [email or web address], password [facility] 01/23/2016, 2:22 PM

## 2016-01-23 NOTE — Progress Notes (Signed)
Tessalon Pearl for cough.  Will continue to monitor.

## 2016-01-24 DIAGNOSIS — E785 Hyperlipidemia, unspecified: Secondary | ICD-10-CM

## 2016-01-24 LAB — BASIC METABOLIC PANEL
Anion gap: 13 (ref 5–15)
BUN: 28 mg/dL — ABNORMAL HIGH (ref 6–20)
CO2: 19 mmol/L — ABNORMAL LOW (ref 22–32)
Calcium: 9.7 mg/dL (ref 8.9–10.3)
Chloride: 93 mmol/L — ABNORMAL LOW (ref 101–111)
Creatinine, Ser: 1.75 mg/dL — ABNORMAL HIGH (ref 0.61–1.24)
GFR calc Af Amer: 45 mL/min — ABNORMAL LOW (ref >60)
GFR calc non Af Amer: 39 mL/min — ABNORMAL LOW (ref >60)
Glucose, Bld: 342 mg/dL — ABNORMAL HIGH (ref 65–99)
Potassium: 4.6 mmol/L (ref 3.5–5.1)
Sodium: 125 mmol/L — ABNORMAL LOW (ref 135–145)

## 2016-01-24 LAB — GLUCOSE, CAPILLARY
Glucose-Capillary: 281 mg/dL — ABNORMAL HIGH (ref 65–99)
Glucose-Capillary: 284 mg/dL — ABNORMAL HIGH (ref 65–99)
Glucose-Capillary: 239 mg/dL — ABNORMAL HIGH (ref 65–99)
Glucose-Capillary: 277 mg/dL — ABNORMAL HIGH (ref 65–99)

## 2016-01-24 MED ORDER — HYDROCOD POLST-CPM POLST ER 10-8 MG/5ML PO SUER
5.0000 mL | Freq: Two times a day (BID) | ORAL | Status: DC | PRN
  Administered 2016-01-24 – 2016-01-26 (×4): 5 mL via ORAL
  Filled 2016-01-24 (×4): qty 5

## 2016-01-24 MED ORDER — IPRATROPIUM-ALBUTEROL 0.5-2.5 (3) MG/3ML IN SOLN
3.0000 mL | Freq: Four times a day (QID) | RESPIRATORY_TRACT | Status: DC
  Administered 2016-01-24 – 2016-01-25 (×6): 3 mL via RESPIRATORY_TRACT
  Filled 2016-01-24 (×6): qty 3

## 2016-01-24 MED ORDER — IPRATROPIUM-ALBUTEROL 0.5-2.5 (3) MG/3ML IN SOLN: 3.0000 mL | RESPIRATORY_TRACT | Status: DC | PRN

## 2016-01-24 MED ORDER — INSULIN DETEMIR 100 UNIT/ML ~~LOC~~ SOLN
10.0000 [IU] | Freq: Every day | SUBCUTANEOUS | Status: DC
  Administered 2016-01-24 – 2016-01-25 (×2): 10 [IU] via SUBCUTANEOUS
  Filled 2016-01-24 (×3): qty 0.1

## 2016-01-24 NOTE — Progress Notes (Signed)
Inpatient Diabetes Program Recommendations  AACE/ADA: New Consensus Statement on Inpatient Glycemic Control (2015)  Target Ranges:  Prepandial:   less than 140 mg/dL      Peak postprandial:   less than 180 mg/dL (1-2 hours)      Critically ill patients:  140 - 180 mg/dL  Results for Matthew Molina, Matthew Molina (MRN [id number]) as of 01/24/2016 08:24  Ref. Range 01/23/2016 08:32 01/23/2016 12:14 01/23/2016 16:40 01/23/2016 20:08 01/24/2016 07:59  Glucose-Capillary Latest Ref Range: 65-99 mg/dL 249 (H) 212 (H) 202 (H) 212 (H) 281 (H)   Review of Glycemic Control  Diabetes history: DM2 Outpatient Diabetes medications: Glipizide 10 mg daily, Metformin 1000 mg BID Current orders for Inpatient glycemic control: Glipizide 10 mg daily, Novolog 0-20 units TID with meals, Novolog 0-5 units HS  Inpatient Diabetes Program Recommendations: Insulin - Basal: If steroids are continued as ordered (Solumedrol 40 mg Q6H), please consider ordering low dose basal insulin. Recommend starting with Levemir 10 units Q24 hours starting now. Please note that if basal insulin is ordered as recommended, it will need to be adjusted as steroids are tapered.  Thanks, Barnie Alderman, RN, MSN, CDE Diabetes Coordinator Inpatient Diabetes Program [phone] (Team Pager from [place] to [place]) [phone] ([place] office) [phone] [facility] office) [phone] [facility] office)

## 2016-01-24 NOTE — Progress Notes (Signed)
[facility] PROGRESS NOTE  TOIVO PATHAMMAVONG [id number] DOB: [birth year] DOA: 01/22/2016 PCP: [name], MD  HPI/Brief narrative Please see admit h and p from 2/4 for details. Briefly, [phone] with hx of copd, active tobacco abuse, hld, htn, dm who presents to the ED with 1 week hx of worsening sob and wheezing. Patient was admitted for further work up and management for copd exacerbation.  Assessment/Plan: 1. Acute COPD exacerbation 1. Continued wheezing on exam 2. Pt is continued on 3LNC, was on RA this am at 91%, cont to wean as tolerated 3. Continue scheduled steroids and nebs 2. HTN 1. bp stable 2. Cont to monitor 3. DM2 1. a1c of 7.1 2. Pt's glucose in the 200's, likely difficult to control secondary to concurrent steroids per above 3. Cont SSI coverage 4. Added levemir at 10 units 4. HLD 1. Cont statin as toerated 5. Tobacco abuse 1. Cessation done face to face 2. Nicotine patch ordered 6. DVT prophylaxis 1. Lovenox subQ  Code Status: Full Family Communication: Pt in room Disposition Plan: plan d/c when off IV meds, possibly in 2-3 days   Consultants:    Procedures:    Antibiotics: Anti-infectives    Start     Dose/Rate Route Frequency Ordered Stop   01/23/16 0100  levofloxacin (LEVAQUIN) IVPB 500 mg     500 mg 100 mL/hr over 60 Minutes Intravenous Every 24 hours 01/23/16 0012        HPI/Subjective: Reports continued wheezing this AM  Objective: Filed Vitals:   01/24/16 0540 01/24/16 0750 01/24/16 1437 01/24/16 1449  BP: 117/74  110/69   Pulse: 110  89   Temp: 97.5 F (36.4 C)  97.6 F (36.4 C)   TempSrc: Oral     Resp: 20  20   Height:      Weight:      SpO2: 96% 91% 97% 96%    Intake/Output Summary (Last 24 hours) at 01/24/16 1545 Last data filed at 01/24/16 1438  Gross per 24 hour  Intake    360 ml  Output   3000 ml  Net  -2640 ml   Filed Weights   01/22/16 2352  Weight: 116.3 kg (256 lb 6.3 oz)    Exam:   General:   Awake, in nad, laying in bed  Cardiovascular: regular, s1, s2  Respiratory: normal resp effort, end-expiratory wheezing throughout  Abdomen: soft,nonidistended  Musculoskeletal: perfused, no clubbing, no cyanosis  Data Reviewed: Basic Metabolic Panel:  Recent Labs Lab 01/22/16 2042 01/23/16 0653 01/24/16 0619  NA 131* 125* 125*  K 3.9 4.6 4.6  CL 97* 92* 93*  CO2 22 18* 19*  GLUCOSE 118* 272* 342*  BUN 20 21* 28*  CREATININE 1.63* 1.73* 1.75*  CALCIUM 9.7 9.9 9.7   Liver Function Tests:  Recent Labs Lab 01/23/16 0653  AST 38  ALT 27  ALKPHOS 40  BILITOT 0.8  PROT 8.0  ALBUMIN 4.5   No results for input(s): LIPASE, AMYLASE in the last 168 hours. No results for input(s): AMMONIA in the last 168 hours. CBC:  Recent Labs Lab 01/22/16 2042 01/23/16 0653  WBC 9.9 10.1  NEUTROABS 4.2  --   HGB 11.7* 11.6*  HCT 34.9* 35.4*  MCV 86.8 87.0  PLT 255 267   Cardiac Enzymes:  Recent Labs Lab 01/22/16 2042  TROPONINI <0.03   BNP (last 3 results)  Recent Labs  07/29/15 0955 08/18/15 1430  BNP 21.0 12.0    ProBNP (last 3 results) No  results for input(s): PROBNP in the last 8760 hours.  CBG:  Recent Labs Lab 01/23/16 1214 01/23/16 1640 01/23/16 2008 01/24/16 0759 01/24/16 1137  GLUCAP 212* 202* 212* 281* 284*    No results found for this or any previous visit (from the past 240 hour(s)).   Studies: Dg Chest Port 1 View  01/22/2016  CLINICAL DATA:  Progressive shortness of breath for days. Wheezing. Productive cough. EXAM: PORTABLE CHEST 1 VIEW COMPARISON:  11/21/2015 FINDINGS: The cardiomediastinal contours are unchanged. Mild motion, allowing for this lungs are clear. Pulmonary vasculature is normal. No consolidation, pleural effusion, or pneumothorax. No acute osseous abnormalities are seen. IMPRESSION: No acute process, allowing for motion limitations. Electronically Signed   By: Jeb Levering M.D.   On: 01/22/2016 21:12    Scheduled  Meds: . amLODipine  10 mg Oral Daily  . aspirin EC  81 mg Oral Daily  . budesonide-formoterol  2 puff Inhalation BID  . busPIRone  15 mg Oral BID  . enoxaparin (LOVENOX) injection  60 mg Subcutaneous Q24H  . glipiZIDE  10 mg Oral Daily  . insulin aspart  0-20 Units Subcutaneous TID WC  . insulin aspart  0-5 Units Subcutaneous QHS  . insulin detemir  10 Units Subcutaneous Daily  . ipratropium-albuterol  3 mL Nebulization Q6H  . irbesartan  300 mg Oral Daily  . levofloxacin (LEVAQUIN) IV  500 mg Intravenous Q24H  . methylPREDNISolone (SOLU-MEDROL) injection  125 mg Intravenous Once  . methylPREDNISolone (SOLU-MEDROL) injection  40 mg Intravenous Q6H  . nicotine  14 mg Transdermal Daily  . pravastatin  40 mg Oral Daily  . risperidone  4 mg Oral QHS  . traZODone  50 mg Oral QHS  . triamterene-hydrochlorothiazide  1 tablet Oral Daily   Continuous Infusions:   Principal Problem:   COPD exacerbation (HCC) Active Problems:   Hyperlipidemia   Essential hypertension   Diabetes mellitus type 2 in obese (HCC)   Tobacco abuse   COPD (chronic obstructive pulmonary disease) ([place])   Allegra Cerniglia, [place] [facility] Pager [phone]. If 7PM-7AM, please contact night-coverage at [email or web address], password [facility] 01/24/2016, 3:45 PM  LOS: 1 day

## 2016-01-25 ENCOUNTER — Telehealth: Payer: Self-pay | Admitting: Family Medicine

## 2016-01-25 LAB — BASIC METABOLIC PANEL
Anion gap: 10 (ref 5–15)
BUN: 28 mg/dL — ABNORMAL HIGH (ref 6–20)
CO2: 23 mmol/L (ref 22–32)
Calcium: 9.9 mg/dL (ref 8.9–10.3)
Chloride: 97 mmol/L — ABNORMAL LOW (ref 101–111)
Creatinine, Ser: 1.56 mg/dL — ABNORMAL HIGH (ref 0.61–1.24)
GFR calc Af Amer: 52 mL/min — ABNORMAL LOW (ref >60)
GFR calc non Af Amer: 45 mL/min — ABNORMAL LOW (ref >60)
Glucose, Bld: 227 mg/dL — ABNORMAL HIGH (ref 65–99)
Potassium: 4.3 mmol/L (ref 3.5–5.1)
Sodium: 130 mmol/L — ABNORMAL LOW (ref 135–145)

## 2016-01-25 LAB — CBC
HCT: 35.1 % — ABNORMAL LOW (ref 39.0–52.0)
Hemoglobin: 11.7 g/dL — ABNORMAL LOW (ref 13.0–17.0)
MCH: 28.7 pg (ref 26.0–34.0)
MCHC: 33.3 g/dL (ref 30.0–36.0)
MCV: 86.2 fL (ref 78.0–100.0)
Platelets: 283 10*3/uL (ref 150–400)
RBC: 4.07 MIL/uL — ABNORMAL LOW (ref 4.22–5.81)
RDW: 14.9 % (ref 11.5–15.5)
WBC: 15.5 10*3/uL — ABNORMAL HIGH (ref 4.0–10.5)

## 2016-01-25 LAB — GLUCOSE, CAPILLARY
Glucose-Capillary: 224 mg/dL — ABNORMAL HIGH (ref 65–99)
Glucose-Capillary: 257 mg/dL — ABNORMAL HIGH (ref 65–99)
Glucose-Capillary: 214 mg/dL — ABNORMAL HIGH (ref 65–99)
Glucose-Capillary: 222 mg/dL — ABNORMAL HIGH (ref 65–99)

## 2016-01-25 MED ORDER — INSULIN ASPART 100 UNIT/ML ~~LOC~~ SOLN
4.0000 [IU] | Freq: Three times a day (TID) | SUBCUTANEOUS | Status: DC
  Administered 2016-01-25 – 2016-01-26 (×3): 4 [IU] via SUBCUTANEOUS

## 2016-01-25 MED ORDER — IPRATROPIUM-ALBUTEROL 0.5-2.5 (3) MG/3ML IN SOLN
3.0000 mL | Freq: Two times a day (BID) | RESPIRATORY_TRACT | Status: DC
  Administered 2016-01-26: 3 mL via RESPIRATORY_TRACT
  Filled 2016-01-25: qty 3

## 2016-01-25 MED ORDER — METHYLPREDNISOLONE SODIUM SUCC 40 MG IJ SOLR
40.0000 mg | Freq: Two times a day (BID) | INTRAMUSCULAR | Status: DC
  Administered 2016-01-25 – 2016-01-26 (×2): 40 mg via INTRAVENOUS
  Filled 2016-01-25 (×2): qty 1

## 2016-01-25 MED ORDER — INSULIN DETEMIR 100 UNIT/ML ~~LOC~~ SOLN
13.0000 [IU] | Freq: Every day | SUBCUTANEOUS | Status: DC
  Administered 2016-01-26: 13 [IU] via SUBCUTANEOUS
  Filled 2016-01-25 (×2): qty 0.13

## 2016-01-25 NOTE — Progress Notes (Signed)
[facility] PROGRESS NOTE  Matthew Molina [id number] DOB: [birth year] DOA: 01/22/2016 PCP: [name], MD  HPI/Brief narrative Please see admit h and p from 2/4 for details. Briefly, [phone] with hx of copd, active tobacco abuse, hld, htn, dm who presents to the ED with 1 week hx of worsening sob and wheezing. Patient was admitted for further work up and management for copd exacerbation.  Assessment/Plan: 1. Acute COPD exacerbation 1. Pt still with wheezing on exam this AM 2. Pt is continued between 2-3LNC, cont to wean as tolerated. Pt is O2 naive 3. Continue scheduled steroids and nebs but will begin to taper steroids to q12hr dosing 2. HTN 1. bp remains stable 2. Cont to monitor 3. DM2 1. a1c of 7.1 2. Pt's glucose in the 200's, likely difficult to control secondary to concurrent steroids per above 3. Cont SSI coverage 4. Cont levemir at 13 units 4. HLD 1. Cont statin as toerated 5. Tobacco abuse 1. Cessation done face to face 2. Nicotine patch ordered 6. DVT prophylaxis 1. Lovenox subQ  Code Status: Full Family Communication: Pt in room Disposition Plan: plan d/c when off IV meds and when on room air, possibly in 1-2 days   Consultants:    Procedures:    Antibiotics: Anti-infectives    Start     Dose/Rate Route Frequency Ordered Stop   01/23/16 0100  levofloxacin (LEVAQUIN) IVPB 500 mg     500 mg 100 mL/hr over 60 Minutes Intravenous Every 24 hours 01/23/16 0012        HPI/Subjective: Still with wheezing this AM, however pt reports feeling somewhat better  Objective: Filed Vitals:   01/25/16 0109 01/25/16 0608 01/25/16 0827 01/25/16 1205  BP:  100/67    Pulse:  81  89  Temp:  98.4 F (36.9 C)    TempSrc:  Oral    Resp:  20    Height:      Weight:      SpO2: 95% 97% 98% 97%    Intake/Output Summary (Last 24 hours) at 01/25/16 1323 Last data filed at 01/25/16 1252  Gross per 24 hour  Intake    840 ml  Output    600 ml  Net    240 ml    Filed Weights   01/22/16 2352  Weight: 116.3 kg (256 lb 6.3 oz)    Exam:   General:  Awake, sitting in bed  Cardiovascular: regular, s1, s2  Respiratory: normal resp effort, end-expiratory wheezing throughout  Abdomen: soft,nonidistended  Musculoskeletal: perfused, no clubbing, no cyanosis  Data Reviewed: Basic Metabolic Panel:  Recent Labs Lab 01/22/16 2042 01/23/16 0653 01/24/16 0619 01/25/16 0640  NA 131* 125* 125* 130*  K 3.9 4.6 4.6 4.3  CL 97* 92* 93* 97*  CO2 22 18* 19* 23  GLUCOSE 118* 272* 342* 227*  BUN 20 21* 28* 28*  CREATININE 1.63* 1.73* 1.75* 1.56*  CALCIUM 9.7 9.9 9.7 9.9   Liver Function Tests:  Recent Labs Lab 01/23/16 0653  AST 38  ALT 27  ALKPHOS 40  BILITOT 0.8  PROT 8.0  ALBUMIN 4.5   No results for input(s): LIPASE, AMYLASE in the last 168 hours. No results for input(s): AMMONIA in the last 168 hours. CBC:  Recent Labs Lab 01/22/16 2042 01/23/16 0653 01/25/16 0640  WBC 9.9 10.1 15.5*  NEUTROABS 4.2  --   --   HGB 11.7* 11.6* 11.7*  HCT 34.9* 35.4* 35.1*  MCV 86.8 87.0 86.2  PLT 255 267  283   Cardiac Enzymes:  Recent Labs Lab 01/22/16 2042  TROPONINI <0.03   BNP (last 3 results)  Recent Labs  07/29/15 0955 08/18/15 1430  BNP 21.0 12.0    ProBNP (last 3 results) No results for input(s): PROBNP in the last 8760 hours.  CBG:  Recent Labs Lab 01/24/16 1137 01/24/16 1623 01/24/16 2143 01/25/16 0746 01/25/16 1147  GLUCAP 284* 239* 277* 224* 257*    No results found for this or any previous visit (from the past 240 hour(s)).   Studies: No results found.  Scheduled Meds: . amLODipine  10 mg Oral Daily  . aspirin EC  81 mg Oral Daily  . budesonide-formoterol  2 puff Inhalation BID  . busPIRone  15 mg Oral BID  . enoxaparin (LOVENOX) injection  60 mg Subcutaneous Q24H  . glipiZIDE  10 mg Oral Daily  . insulin aspart  0-20 Units Subcutaneous TID WC  . insulin aspart  0-5 Units Subcutaneous QHS   . insulin aspart  4 Units Subcutaneous TID WC  . [START ON 01/26/2016] insulin detemir  13 Units Subcutaneous Daily  . ipratropium-albuterol  3 mL Nebulization Q6H  . irbesartan  300 mg Oral Daily  . levofloxacin (LEVAQUIN) IV  500 mg Intravenous Q24H  . methylPREDNISolone (SOLU-MEDROL) injection  40 mg Intravenous Q12H  . nicotine  14 mg Transdermal Daily  . pravastatin  40 mg Oral Daily  . risperidone  4 mg Oral QHS  . traZODone  50 mg Oral QHS  . triamterene-hydrochlorothiazide  1 tablet Oral Daily   Continuous Infusions:   Principal Problem:   COPD exacerbation (HCC) Active Problems:   Hyperlipidemia   Essential hypertension   Diabetes mellitus type 2 in obese (HCC)   Tobacco abuse   COPD (chronic obstructive pulmonary disease) ([place])   [name], [place] [facility] Pager [phone]. If 7PM-7AM, please contact night-coverage at [email or web address], password [facility] 01/25/2016, 1:23 PM  LOS: 2 days

## 2016-01-25 NOTE — Telephone Encounter (Signed)
Patient is asking questions about [place], please advise?

## 2016-01-25 NOTE — Consult Note (Signed)
   [facility] CM Inpatient Consult   01/25/2016  Matthew Molina Apr 19, 1951 [id number]  Spoke with patient at bedside to introduce [facility] services. Patient does not want to participate with [facility] at this time. Patient given [facility]: [facility] brochure and contact information for future reference.  Of note, [facility] services would not replace or interfere with any services that are arranged by inpatient case management or social work. For additional questions or referrals please contact:  Royetta Crochet. [name], RN, BSN, [facility] Liaison [phone]

## 2016-01-25 NOTE — Progress Notes (Signed)
Inpatient Diabetes Program Recommendations  AACE/ADA: New Consensus Statement on Inpatient Glycemic Control (2015)  Target Ranges:  Prepandial:   less than 140 mg/dL      Peak postprandial:   less than 180 mg/dL (1-2 hours)      Critically ill patients:  140 - 180 mg/dL  Results for TYREASE, VERBLE (MRN [id number]) as of 01/25/2016 09:02  Ref. Range 01/24/2016 07:59 01/24/2016 11:37 01/24/2016 16:23 01/24/2016 21:43 01/25/2016 07:46  Glucose-Capillary Latest Ref Range: 65-99 mg/dL 281 (H) 284 (H) 239 (H) 277 (H) 224 (H)   Review of Glycemic Control  Diabetes history: DM2 Outpatient Diabetes medications: Glipizide 10 mg daily, Metformin 1000 mg BID Current orders for Inpatient glycemic control: Levemir 10 units daily, Glipizide 10 mg daily, Novolog 0-20 units TID with meals, Novolog 0-5 units HS  Inpatient Diabetes Program Recommendations: Insulin - Basal: If steroids are continued as ordered (Solumedrol 40 mg Q6H), please consider increasing Levemir to 13 units daily. Please note that if basal insulin is ordered as recommended, it will need to be adjusted as steroids are tapered. Insulin-meal coverage:  If steroids are continued as ordered (Solumedrol 40 mg Q6H), please consider ordering Novolog 4 units TID with meals for meal coverage.  Thanks, Barnie Alderman, RN, MSN, CDE Diabetes Coordinator Inpatient Diabetes Program [phone] (Team Pager from [place] to [place]) [phone] ([place] office) [phone] [facility] office) [phone] [facility] office)

## 2016-01-26 DIAGNOSIS — E119 Type 2 diabetes mellitus without complications: Secondary | ICD-10-CM

## 2016-01-26 DIAGNOSIS — J441 Chronic obstructive pulmonary disease with (acute) exacerbation: Principal | ICD-10-CM

## 2016-01-26 DIAGNOSIS — I10 Essential (primary) hypertension: Secondary | ICD-10-CM

## 2016-01-26 DIAGNOSIS — E669 Obesity, unspecified: Secondary | ICD-10-CM

## 2016-01-26 LAB — BASIC METABOLIC PANEL
Anion gap: 7 (ref 5–15)
BUN: 25 mg/dL — ABNORMAL HIGH (ref 6–20)
CO2: 26 mmol/L (ref 22–32)
Calcium: 9.9 mg/dL (ref 8.9–10.3)
Chloride: 96 mmol/L — ABNORMAL LOW (ref 101–111)
Creatinine, Ser: 1.52 mg/dL — ABNORMAL HIGH (ref 0.61–1.24)
GFR calc Af Amer: 53 mL/min — ABNORMAL LOW (ref >60)
GFR calc non Af Amer: 46 mL/min — ABNORMAL LOW (ref >60)
Glucose, Bld: 319 mg/dL — ABNORMAL HIGH (ref 65–99)
Potassium: 4.5 mmol/L (ref 3.5–5.1)
Sodium: 129 mmol/L — ABNORMAL LOW (ref 135–145)

## 2016-01-26 LAB — CBC
HCT: 37.2 % — ABNORMAL LOW (ref 39.0–52.0)
Hemoglobin: 12.4 g/dL — ABNORMAL LOW (ref 13.0–17.0)
MCH: 28.9 pg (ref 26.0–34.0)
MCHC: 33.3 g/dL (ref 30.0–36.0)
MCV: 86.7 fL (ref 78.0–100.0)
Platelets: 301 10*3/uL (ref 150–400)
RBC: 4.29 MIL/uL (ref 4.22–5.81)
RDW: 15.1 % (ref 11.5–15.5)
WBC: 14.5 10*3/uL — ABNORMAL HIGH (ref 4.0–10.5)

## 2016-01-26 LAB — GLUCOSE, CAPILLARY
Glucose-Capillary: 330 mg/dL — ABNORMAL HIGH (ref 65–99)
Glucose-Capillary: 369 mg/dL — ABNORMAL HIGH (ref 65–99)
Glucose-Capillary: 283 mg/dL — ABNORMAL HIGH (ref 65–99)

## 2016-01-26 MED ORDER — PREDNISONE 10 MG PO TABS: 10.0000 mg | ORAL_TABLET | Freq: Every day | ORAL | Status: DC

## 2016-01-26 MED ORDER — LEVOFLOXACIN 500 MG PO TABS: 500.0000 mg | ORAL_TABLET | Freq: Every day | ORAL | Status: DC

## 2016-01-26 NOTE — Progress Notes (Signed)
Patient with orders to be discharge home. Discharge instructions given, patient verbalized understanding. Prescriptions given. Patient stable. Patient left in private vehicle with family.  

## 2016-01-26 NOTE — Discharge Summary (Signed)
Physician Discharge Summary  Matthew Molina [id number] DOB: 1951/08/02 DOA: 01/22/2016  PCP: [name], MD  Admit date: 01/22/2016 Discharge date: 01/26/2016  Time spent: 45 minutes  Recommendations for Outpatient Follow-up:  -Will be discharged home today. -Advised to follow-up with primary care provider in 2 weeks.   Discharge Diagnoses:  Principal Problem:   COPD exacerbation ([place]) Active Problems:   Hyperlipidemia   Essential hypertension   Diabetes mellitus type 2 in obese ([place])   Tobacco abuse   COPD (chronic obstructive pulmonary disease) ([place])   Discharge Condition: Stable and improved  Filed Weights   01/22/16 2352  Weight: 116.3 kg (256 lb 6.3 oz)    History of present illness:  As per Dr. Marin Comment on 2/4: Matthew Molina is an 65 y.o. male with a hx of COPD, tobacco abuse, HLD, DM, and HTN that presented with SOB that began about 1 week ago. Patient reports worsening SOB with an associated cough and wheezing. He has been using his nebulizer with temporary relief at home. He denies any CP, back pain, n/v/d, or fever. Patient reports that he smokes about 1ppd. While in the ED, labs revealed a creatinine 1.63, glucose 118, negative troponin and unremarkable CBC. CXR was negative for an acute process. Hospitalist was asked to admit him for COPD exacerbation.   Hospital Course:   COPD with acute exacerbation -Clinically resolved, no wheezing, patient is not experiencing any shortness of breath and has ambulated today without issue or oxygen requirements. -We'll discharge home today on a tapering dose of steroids. -Levaquin for 5 more days.  Hypertension -Well-controlled, continue home medications.  Hyperlipidemia -Continue statin.  Tobacco abuse -Cessation counseling provided. -Nicotine patch was provided while in the hospital, he does not wish to have nicotine patches prescribed at discharge.  Type 2 diabetes -Uncontrolled, likely due to steroid use.  Would expect improvement as we continue to titrate steroids.  Procedures:  None   Consultations:  None  Discharge Instructions  Discharge Instructions    Diet - low sodium heart healthy    Complete by:  As directed      Increase activity slowly    Complete by:  As directed             Medication List    TAKE these medications        albuterol 108 (90 Base) MCG/ACT inhaler  Commonly known as:  PROVENTIL HFA;VENTOLIN HFA  Inhale 2 puffs into the lungs every 6 (six) hours as needed for wheezing or shortness of breath.     amLODipine 10 MG tablet  Commonly known as:  NORVASC  Take 1 tablet (10 mg total) by mouth daily.     aspirin EC 81 MG tablet  Take 81 mg by mouth daily.     budesonide-formoterol 80-4.5 MCG/ACT inhaler  Commonly known as:  SYMBICORT  INHALE 2 PUFFS INTO LUNGS TWICE DAILY.     busPIRone 15 MG tablet  Commonly known as:  BUSPAR  Take half a pill twice a day     glipiZIDE 10 MG 24 hr tablet  Commonly known as:  GLUCOTROL XL  Take 1 tablet (10 mg total) by mouth daily.     ipratropium-albuterol 0.5-2.5 (3) MG/3ML Soln  Commonly known as:  DUONEB  Take 3 mLs by nebulization every 4 (four) hours as needed.     levofloxacin 500 MG tablet  Commonly known as:  LEVAQUIN  Take 1 tablet (500 mg total) by mouth daily.  metFORMIN 1000 MG tablet  Commonly known as:  GLUCOPHAGE  TAKE 1 TABLET BY MOUTH TWICE DAILY WITH FOOD FOR DIABETES.     polyethylene glycol powder powder  Commonly known as:  GLYCOLAX/MIRALAX  MIX 1 CAPFUL (17G) IN 8 OUNCES OF JUICE/WATER AND DRINK ONCE DAILY.     pravastatin 40 MG tablet  Commonly known as:  PRAVACHOL  TAKE (1) TABLET BY MOUTH AT BEDTIME FOR CHOLESTEROL.     predniSONE 10 MG tablet  Commonly known as:  DELTASONE  Take 1 tablet (10 mg total) by mouth daily with breakfast. Take 6 tablets today and then decrease by 1 tablet daily until none are left.     risperiDONE 1 MG tablet  Commonly known as:  RISPERDAL   Take 1 tablet (1 mg total) by mouth at bedtime. Takes 5mg  total every day at bedtime     risperidone 4 MG tablet  Commonly known as:  RISPERDAL  Take 1 tablet (4 mg total) by mouth at bedtime.     SPIRIVA HANDIHALER 18 MCG inhalation capsule  Generic drug:  tiotropium  INHALE 1 CAPSULE AS DIRECTED ONCE A DAY.     traZODone 50 MG tablet  Commonly known as:  DESYREL  One tablet at bedtime for sleep     triamterene-hydrochlorothiazide 37.5-25 MG tablet  Commonly known as:  MAXZIDE-25  Take 1 tablet by mouth daily.     valsartan 320 MG tablet  Commonly known as:  DIOVAN  Take 1 tablet (320 mg total) by mouth daily.       Allergies  Allergen Reactions  . Sertraline Hcl     Stomach upset/pain  . Wellbutrin [Bupropion] Other (See Comments)    Makes stomach hurt  . Wellbutrin [Bupropion]   . Zoloft [Sertraline Hcl]        Follow-up Information    Follow up with [name], MD. Schedule an appointment as soon as possible for a visit in 2 weeks.   Specialty:  Family Medicine   Contact information:   [address] [place] [address] [phone]        The results of significant diagnostics from this hospitalization (including imaging, microbiology, ancillary and laboratory) are listed below for reference.    Significant Diagnostic Studies: Dg Chest Port 1 View  01/22/2016  CLINICAL DATA:  Progressive shortness of breath for days. Wheezing. Productive cough. EXAM: PORTABLE CHEST 1 VIEW COMPARISON:  11/21/2015 FINDINGS: The cardiomediastinal contours are unchanged. Mild motion, allowing for this lungs are clear. Pulmonary vasculature is normal. No consolidation, pleural effusion, or pneumothorax. No acute osseous abnormalities are seen. IMPRESSION: No acute process, allowing for motion limitations. Electronically Signed   By: Jeb Levering M.D.   On: 01/22/2016 21:12    Microbiology: No results found for this or any previous visit (from the past 240  hour(s)).   Labs: Basic Metabolic Panel:  Recent Labs Lab 01/22/16 2042 01/23/16 0653 01/24/16 0619 01/25/16 0640 01/26/16 0622  NA 131* 125* 125* 130* 129*  K 3.9 4.6 4.6 4.3 4.5  CL 97* 92* 93* 97* 96*  CO2 22 18* 19* 23 26  GLUCOSE 118* 272* 342* 227* 319*  BUN 20 21* 28* 28* 25*  CREATININE 1.63* 1.73* 1.75* 1.56* 1.52*  CALCIUM 9.7 9.9 9.7 9.9 9.9   Liver Function Tests:  Recent Labs Lab 01/23/16 0653  AST 38  ALT 27  ALKPHOS 40  BILITOT 0.8  PROT 8.0  ALBUMIN 4.5   No results for  input(s): LIPASE, AMYLASE in the last 168 hours. No results for input(s): AMMONIA in the last 168 hours. CBC:  Recent Labs Lab 01/22/16 2042 01/23/16 0653 01/25/16 0640 01/26/16 0622  WBC 9.9 10.1 15.5* 14.5*  NEUTROABS 4.2  --   --   --   HGB 11.7* 11.6* 11.7* 12.4*  HCT 34.9* 35.4* 35.1* 37.2*  MCV 86.8 87.0 86.2 86.7  PLT 255 267 283 301   Cardiac Enzymes:  Recent Labs Lab 01/22/16 2042  TROPONINI <0.03   BNP: BNP (last 3 results)  Recent Labs  07/29/15 0955 08/18/15 1430  BNP 21.0 12.0    ProBNP (last 3 results) No results for input(s): PROBNP in the last 8760 hours.  CBG:  Recent Labs Lab 01/25/16 1643 01/25/16 2125 01/26/16 0720 01/26/16 1123 01/26/16 1616  GLUCAP 214* 222* 330* 369* 283*       Signed:  Lelon Frohlich  [facility] Pager: [phone] 01/26/2016, 4:31 PM

## 2016-01-26 NOTE — Telephone Encounter (Signed)
Patient aware.

## 2016-01-26 NOTE — Progress Notes (Signed)
Inpatient Diabetes Program Recommendations  AACE/ADA: New Consensus Statement on Inpatient Glycemic Control (2015)  Target Ranges:  Prepandial:   less than 140 mg/dL      Peak postprandial:   less than 180 mg/dL (1-2 hours)      Critically ill patients:  140 - 180 mg/dL  Results for Matthew Molina, Matthew Molina (MRN [id number]) as of 01/26/2016 11:37  Ref. Range 01/25/2016 07:46 01/25/2016 11:47 01/25/2016 16:43 01/25/2016 21:25 01/26/2016 07:20 01/26/2016 11:23  Glucose-Capillary Latest Ref Range: 65-99 mg/dL 224 (H) 257 (H) 214 (H) 222 (H) 330 (H) 369 (H)   Review of Glycemic Control  Diabetes history: DM2 Outpatient Diabetes medications: Glipizide 10 mg daily, Metformin 1000 mg BID Current orders for Inpatient glycemic control: Levemir 10 units daily, Glipizide 10 mg daily, Novolog 0-20 units TID with meals, Novolog 0-5 units HS  Inpatient Diabetes Program Recommendations: Insulin - Basal: Noted Levemir was increased to 13 units daily on 01/25/16 and patient will get increased dose starting today. Patient has not yet received Levemir today which is scheduled for 10 am.  Insulin-meal coverage: If steroids are continued as ordered (Solumedrol 40 mg Q12H), please consider increasing meal coverage to Novolog 6 units TID with meals for meal coverage.  Thanks, Barnie Alderman, RN, MSN, CDE Diabetes Coordinator Inpatient Diabetes Program [phone] (Team Pager from [place] to [place]) [phone] ([facility] office) [phone] [facility] office) [phone] [facility] office)

## 2016-01-26 NOTE — Telephone Encounter (Signed)
Is going to sign up for services

## 2016-01-27 ENCOUNTER — Other Ambulatory Visit: Payer: Self-pay

## 2016-01-27 ENCOUNTER — Encounter: Payer: Self-pay | Admitting: Gastroenterology

## 2016-01-27 ENCOUNTER — Ambulatory Visit (INDEPENDENT_AMBULATORY_CARE_PROVIDER_SITE_OTHER): Payer: Commercial Managed Care - HMO | Admitting: Gastroenterology

## 2016-01-27 VITALS — BP 116/66 | HR 117 | Temp 98.4°F | Ht 72.0 in | Wt 256.8 lb

## 2016-01-27 DIAGNOSIS — R1310 Dysphagia, unspecified: Secondary | ICD-10-CM | POA: Diagnosis not present

## 2016-01-27 NOTE — Assessment & Plan Note (Addendum)
WEIGHT STABLE AND MOST LIKELY DUE TO STRICTURE OR SCHATZKI'S RING, LESS LIKELY ESOPHAGEAL OR EG JUNCTION CANCER.  FOLLOW A SOFT MECHANICAL DIET. MEATS SHOULD BE BAKED, BROILED, OR BOILED.  MEATS SHOULD BE CHOPPED OR GROUND ONLY. DO NOT EAT CHUNKS OF ANYTHING. CRUSH MEDS IF PHARAMCY SAY IT'S OK. DRINK WATER TO KEEP YOUR URINE LIGHT YELLOW. HOLD WATER PILL AND GLIPIZIDE ON THE MORNING OF YOU ENDOSCOPY. CONTINUE GLUCOPHAGE. EGD WITHIN THE NEXT 2-3 WEEKS. DISCUSSED PROCEDURE, BENEFITS, & RISKS: < 1% chance of medication reaction, PERFORATION, OR bleeding. PHENERGAN 12.5 MG IV IN PREOP. FOLLOW UP IN 4 MOS.

## 2016-01-27 NOTE — Patient Instructions (Addendum)
FOLLOW A SOFT MECHANICAL DIET. MEATS SHOULD BE BAKED, BROILED, OR BOILED.  MEATS SHOULD BE CHOPPED OR GROUND ONLY. DO NOT EAT CHUNKS OF ANYTHING. CRUSH MEDS IF PHARMACY SAY IT'S OK.  DRINK WATER TO KEEP YOUR URINE LIGHT YELLOW.  HOLD WATER PILL AND GLIPIZIDE ON THE MORNING OF YOUR ENDOSCOPY. CONTINUE GLUCOPHAGE.  FOLLOW UP IN 4 MOS.

## 2016-01-27 NOTE — Progress Notes (Signed)
Subjective:    Patient ID: Matthew Molina, male    DOB: 06-17-1951, 65 y.o.   MRN: [id number]  [name], MD   HPI LAST SEEN 2014 FOR COLONOSCOPY. Trouble swallowing pills and FOOD FOR A WHILE.  HAPPENS 2-3 TIMES A  WEEK. EVENTUALLY STUFF GOES DOWN. DOESN'T HAVE TO INDUCE VOMITING. NO WEIGHT LOSS. BMs: last time moved SUN NIGHT, ABOUT 2-3X/WEEK. SOB: BASELINE.  RARE HEARTBURN: 2-3X/MO. PT DENIES FEVER, CHILLS, HEMATOCHEZIA,  nausea, vomiting, melena, diarrhea, CHEST PAIN, CHANGE IN BOWEL IN HABITS, constipation, abdominal pain, OR problems with sedation.   Past Medical History  Diagnosis Date  . Nicotine addiction   . Schizophrenia ([place])   . Hyperlipidemia   . Obesity   . Diabetes mellitus   . Arthritis   . Acute respiratory failure with hypoxia ([place])   . Acute kidney injury ([place])   . Multiple lung nodules on CT 04/02/2015  . Depression   . COPD (chronic obstructive pulmonary disease) ([place])   . Diabetes mellitus without complication ([place])   . Hypertension   . Hypercholesterolemia    Past Surgical History  Procedure Laterality Date  . Colonoscopy    . Colonoscopy N/A 04/01/2013    Procedure: COLONOSCOPY;  Surgeon: Danie Binder, MD;  Location: [facility]O [facility]ITE;  Service: Endoscopy;  Laterality: N/A;  10:00 AM-moved to [place] notified pt  . Cataract extraction w/phaco Left 11/20/2013    Procedure: CATARACT EXTRACTION PHACO AND INTRAOCULAR LENS PLACEMENT (IOC);  Surgeon: Tonny Branch, MD;  Location: [facility] ORS;  Service: Ophthalmology;  Laterality: Left;  CDE:10.26  . Cataract extraction w/phaco Right 12/08/2013    Procedure: RIGHT EYE CATARACT EXTRACTION PHACO AND INTRAOCULAR LENS PLACEMENT ;  Surgeon: Tonny Branch, MD;  Location: [facility] ORS;  Service: Ophthalmology;  Laterality: Right;  CDE 12.38  . Eye surgery Left 11/2013    cataract extraction  . Shoulder surgery     Allergies  Allergen Reactions  . Sertraline Hcl     Stomach upset/pain  . Wellbutrin [Bupropion] Other  (See Comments)    Makes stomach hurt  . Wellbutrin [Bupropion]   . Zoloft [Sertraline Hcl]     Current Outpatient Prescriptions  Medication Sig Dispense Refill  . albuterol (PROVENTIL HFA;VENTOLIN HFA) 108 (90 BASE) MCG/ACT inhaler Inhale 2 puffs into the lungs every 6 (six) hours as needed for wheezing or shortness of breath.    Marland Kitchen amLODipine (NORVASC) 10 MG tablet Take 1 tablet (10 mg total) by mouth daily.    Marland Kitchen aspirin EC 81 MG tablet Take 81 mg by mouth daily.    Dellis Anes) 80-4.5 MCG/ACT inhaler INHALE 2 PUFFS INTO LUNGS TWICE DAILY. (Patient taking differently: Inhale 2 puffs into the lungs 2 (two) times daily. )    . busPIRone (BUSPAR) 15 MG tablet Take half a pill twice a day (Patient taking differently: Take 7.5 mg by mouth 2 (two) times daily. )    . GLUCOTROL XL 10 MG 24 hr tablet Take 1 tablet (10 mg total) by mouth daily.    Marland Kitchen ipratropium-albuterol (DUONEB) 0.5-2.5 (3) MG/3ML SOLN Take 3 mLs by nebulization every 4 (four) hours as needed. (Patient taking differently: Take 3 mLs by nebulization every 4 (four) hours as needed (for shortness of breath). )    . metFORMIN (GLUCOPHAGE) 1000 MG tablet TAKE 1 TABLET BY MOUTH TWICE DAILY WITH FOOD FOR DIABETES.    .  (GLYCOLAX/MIRALAX) powder MIX 1 CAPFUL (17G) IN 8 OUNCES OF JUICE/WATER AND DRINK ONCE  DAILY.    Marland Kitchen PRAVACHOL 40 MG tablet TAKE (1) TABLET BY MOUTH AT BEDTIME FOR CHOLESTEROL.    Marland Kitchen risperdal Take 1 tablet (1 mg total) by mouth at bedtime. Takes 5mg  total every day at bedtime    . risperdal Take 1 tablet (4 mg total) by mouth at bedtime.    Marland Kitchen SPIRIVA HANDIHALER INHALE 1 CAPSULE AS DIRECTED ONCE A DAY.    . traZODone (DESYREL) 50 MG tablet One tablet at bedtime for sleep (Patient taking differently: Take 50 mg by mouth at bedtime as needed for sleep. )     HCTZ Take 1 tablet by mouth daily.    . valsartan (DIOVAN) 320 MG tablet Take 1 tablet (320 mg total) by mouth daily.    Marland Kitchen levofloxacin (LEVAQUIN) 500 MG tablet Take 1 tablet  (500 mg total) by mouth daily. (Patient not taking: Reported on 01/27/2016)    .      Marland Kitchen        Review of Systems PER HPI OTHERWISE ALL SYSTEMS ARE NEGATIVE.    Objective:   Physical Exam  Constitutional: He is oriented to person, place, and time. He appears well-developed and well-nourished. No distress.  HENT:  Head: Normocephalic and atraumatic.  Mouth/Throat: Oropharynx is clear and moist. No oropharyngeal exudate.  POOR DENTITION  Eyes: Pupils are equal, round, and reactive to light. No scleral icterus.  Neck: Normal range of motion. Neck supple.  Cardiovascular: Normal rate, regular rhythm and normal heart sounds.   Pulmonary/Chest: Effort normal and breath sounds normal. No respiratory distress.  Abdominal: Soft. Bowel sounds are normal. He exhibits no distension. There is tenderness. There is no rebound and no guarding.  MILD RUQ/RLQ TTP  Musculoskeletal: He exhibits no edema.  WALKS ASSISTED WITH A CANE.  Lymphadenopathy:    He has no cervical adenopathy.  Neurological: He is alert and oriented to person, place, and time.  NO  NEW FOCAL DEFICITS  Psychiatric: He has a normal mood and affect.  Vitals reviewed.     Assessment & Plan:

## 2016-01-27 NOTE — Progress Notes (Signed)
ON RECALL  °

## 2016-01-27 NOTE — Progress Notes (Signed)
cc'ed to pcp °

## 2016-01-28 ENCOUNTER — Telehealth: Payer: Self-pay

## 2016-01-28 ENCOUNTER — Other Ambulatory Visit: Payer: Self-pay | Admitting: *Deleted

## 2016-01-28 ENCOUNTER — Telehealth: Payer: Self-pay | Admitting: Family Medicine

## 2016-01-28 MED ORDER — NICOTINE 21 MG/24HR TD PT24: 21.0000 mg | MEDICATED_PATCH | Freq: Every day | TRANSDERMAL | Status: DC

## 2016-01-28 NOTE — Telephone Encounter (Signed)
No pre-cert needed for  EGD/dilaton per [employer]

## 2016-01-28 NOTE — Patient Outreach (Signed)
Transition of Care  Referral received from patient. Patient recently discharged from acute care.  Spoke with patient reviewed [facility] program. Patient is interested and requested a home visit. Home visit scheduled for Monday.  Patient reports he has f/u appointment with Primary care Patient states no changes to medications.  Plan visit on Monday. Royetta Crochet. [name], RN, BSN, [place] [phone]

## 2016-01-28 NOTE — Telephone Encounter (Signed)
Matthew Molina is asking for a refill on Nicotine Patches, please advise?

## 2016-01-28 NOTE — Telephone Encounter (Signed)
Refilled

## 2016-01-29 ENCOUNTER — Encounter: Payer: Self-pay | Admitting: Family Medicine

## 2016-01-29 NOTE — Assessment & Plan Note (Signed)
[name] is reminded of the importance of commitment to daily physical activity for 30 minutes or more, as able and the need to limit carbohydrate intake to 30 to 60 grams per meal to help with blood sugar control.   The need to take medication as prescribed, test blood sugar as directed, and to call between visits if there is a concern that blood sugar is uncontrolled is also discussed.   [name] is reminded of the importance of daily foot exam, annual eye examination, and good blood sugar, blood pressure and cholesterol control. Controlled, no change in medication   Diabetic Labs Latest Ref Rng 01/26/2016 01/25/2016 01/24/2016 01/23/2016 01/22/2016  HbA1c <5.7 % - - - - -  Microalbumin Not estab mg/dL - - - - -  Micro/Creat Ratio <30 mcg/mg creat - - - - -  Chol 125 - 200 mg/dL - - - - -  HDL >=40 mg/dL - - - - -  Calc LDL <130 mg/dL - - - - -  Triglycerides <150 mg/dL - - - - -  Creatinine 0.61 - 1.24 mg/dL 1.52(H) 1.56(H) 1.75(H) 1.73(H) 1.63(H)   BP/Weight 01/27/2016 01/26/2016 01/22/2016 01/05/2016 11/29/2015 11/22/2015 123456  Systolic BP 99991111 123456 - [id number] -  Diastolic BP 66 74 - 70 62 52 -  Wt. (Lbs) 256.8 - 256.4 258.12 258 - 22.5  BMI 34.82 - 33.83 34.06 34.05 - 2.97  Some encounter information is confidential and restricted. Go to Review Flowsheets activity to see all data.   Foot/eye exam completion dates Latest Ref Rng 08/30/2015 01/27/2015  Eye Exam No Retinopathy - No Retinopathy  Foot Form Completion - Done -

## 2016-01-29 NOTE — Assessment & Plan Note (Signed)
Uncontrolled Sleep hygiene reviewed and written information offered also. Prescription sent for  medication needed.  

## 2016-01-29 NOTE — Assessment & Plan Note (Signed)
New increased difficulty with swallowing solids , GI to eval and manage

## 2016-01-29 NOTE — Assessment & Plan Note (Signed)
Patient counseled for approximately 5 minutes regarding the health risks of ongoing nicotine use, specifically all types of cancer, heart disease, stroke and respiratory failure. The options available for help with cessation ,the behavioral changes to assist the process, and the option to either gradully reduce usage  Or abruptly stop.is also discussed. Pt is also encouraged to set specific goals in number of cigarettes used daily, as well as to set a quit date.  Number of cigarettes/cigars currently smoking daily: 10 to 15  

## 2016-01-29 NOTE — Assessment & Plan Note (Signed)
Hyperlipidemia:Low fat diet discussed and encouraged.   Lipid Panel  Lab Results  Component Value Date   CHOL 129 01/05/2016   HDL 39* 01/05/2016   LDLCALC 63 01/05/2016   TRIG 136 01/05/2016   CHOLHDL 3.3 01/05/2016   Encouraged tocommit to daily physical activity to improve HDL

## 2016-01-29 NOTE — Assessment & Plan Note (Signed)
Controlled, no change in medication DASH diet and commitment to daily physical activity for a minimum of 30 minutes discussed and encouraged, as a part of hypertension management. The importance of attaining a healthy weight is also discussed.  BP/Weight 01/27/2016 01/26/2016 01/22/2016 01/05/2016 11/29/2015 11/22/2015 123456  Systolic BP 99991111 123456 - [id number] -  Diastolic BP 66 74 - 70 62 52 -  Wt. (Lbs) 256.8 - 256.4 258.12 258 - 22.5  BMI 34.82 - 33.83 34.06 34.05 - 2.97  Some encounter information is confidential and restricted. Go to Review Flowsheets activity to see all data.

## 2016-01-31 ENCOUNTER — Other Ambulatory Visit: Payer: Self-pay | Admitting: *Deleted

## 2016-01-31 ENCOUNTER — Encounter: Payer: Self-pay | Admitting: *Deleted

## 2016-01-31 NOTE — Patient Outreach (Signed)
[facility]) Care Management   01/31/2016  Matthew Molina 03-Aug-1951 [id number]  Matthew Molina is an 65 y.o. male   Met with patient and also neighbor,Edna Moore  Subjective:   "what all does your program provide?" "can you help me communicate with my doctors?" Patient requests RNCM go to medical appointment with Dr.Simpson on 2/21  Patient reports he was in the hospital for COPD. He states he quit smoking a couple of days before he was hospitalized. Patient reports he does not take COPD medications as directed. His neighbor helps him with his medications. He no longer has Physiological scientist. Neighbor, Darcel Bayley is assisting with meals also.     Objective:   Patient neatly groomed and dressed. BP 106/60 mmHg  Pulse 100  Resp 20  Ht 1.854 m (_0 )  Wt 260 lb (117.935 kg)  BMI 34.31 kg/m2  SpO2 96% Review of Systems  Constitutional: Negative.   HENT: Negative.   Eyes: Negative.   Respiratory: Negative.   Cardiovascular: Negative.   Gastrointestinal: Negative.   Genitourinary: Negative.   Musculoskeletal: Negative.   Skin: Negative.   Neurological: Negative.   Endo/Heme/Allergies: Negative.   Psychiatric/Behavioral:       Diagnosis of schizophrenia    Physical Exam  Constitutional: He is oriented to person, place, and time. He appears well-developed and well-nourished.  Cardiovascular: Normal rate.   Respiratory: Effort normal.  GI: Soft.  Musculoskeletal: Normal range of motion.  Neurological: He is alert and oriented to person, place, and time.  Skin: Skin is warm.  Psychiatric:  Flat affect    Current Medications:   Current Outpatient Prescriptions  Medication Sig Dispense Refill  . albuterol (PROVENTIL HFA;VENTOLIN HFA) 108 (90 BASE) MCG/ACT inhaler Inhale 2 puffs into the lungs every 6 (six) hours as needed for wheezing or shortness of breath. 1 Inhaler 2  . amLODipine (NORVASC) 10 MG tablet Take 1 tablet (10 mg total) by mouth daily. 30  tablet 5  . aspirin EC 81 MG tablet Take 81 mg by mouth daily.    . budesonide-formoterol (SYMBICORT) 80-4.5 MCG/ACT inhaler INHALE 2 PUFFS INTO LUNGS TWICE DAILY. (Patient taking differently: Inhale 2 puffs into the lungs 2 (two) times daily. ) 10.2 g 3  . busPIRone (BUSPAR) 15 MG tablet Take half a pill twice a day (Patient taking differently: Take 7.5 mg by mouth 2 (two) times daily. ) 30 tablet 2  . glipiZIDE (GLUCOTROL XL) 10 MG 24 hr tablet Take 1 tablet (10 mg total) by mouth daily. 30 tablet 4  . ipratropium-albuterol (DUONEB) 0.5-2.5 (3) MG/3ML SOLN Take 3 mLs by nebulization every 4 (four) hours as needed. (Patient taking differently: Take 3 mLs by nebulization every 4 (four) hours as needed (for shortness of breath). ) 360 mL 1  . metFORMIN (GLUCOPHAGE) 1000 MG tablet TAKE 1 TABLET BY MOUTH TWICE DAILY WITH FOOD FOR DIABETES. 60 tablet 3  . nicotine (NICODERM CQ - DOSED IN MG/24 HOURS) 21 mg/24hr patch Place 1 patch (21 mg total) onto the skin daily. 28 patch 0  . polyethylene glycol powder (GLYCOLAX/MIRALAX) powder MIX 1 CAPFUL (17G) IN 8 OUNCES OF JUICE/WATER AND DRINK ONCE DAILY. 527 g 3  . pravastatin (PRAVACHOL) 40 MG tablet TAKE (1) TABLET BY MOUTH AT BEDTIME FOR CHOLESTEROL. 30 tablet 5  . risperiDONE (RISPERDAL) 1 MG tablet Take 1 tablet (1 mg total) by mouth at bedtime. Takes 42m total every day at bedtime 30 tablet 2  . risperidone (RISPERDAL)  4 MG tablet Take 1 tablet (4 mg total) by mouth at bedtime. 30 tablet 2  . SPIRIVA HANDIHALER 18 MCG inhalation capsule INHALE 1 CAPSULE AS DIRECTED ONCE A DAY. 30 capsule 4  . traZODone (DESYREL) 50 MG tablet One tablet at bedtime for sleep (Patient taking differently: Take 50 mg by mouth at bedtime as needed for sleep. ) 30 tablet 3  . triamterene-hydrochlorothiazide (MAXZIDE-25) 37.5-25 MG tablet Take 1 tablet by mouth daily. 30 tablet 5  . valsartan (DIOVAN) 320 MG tablet Take 1 tablet (320 mg total) by mouth daily. 30 tablet 5  .  levofloxacin (LEVAQUIN) 500 MG tablet Take 1 tablet (500 mg total) by mouth daily. (Patient not taking: Reported on 01/27/2016) 5 tablet 0  . predniSONE (DELTASONE) 10 MG tablet Take 1 tablet (10 mg total) by mouth daily with breakfast. Take 6 tablets today and then decrease by 1 tablet daily until none are left. (Patient not taking: Reported on 01/27/2016) 21 tablet 0  . [DISCONTINUED] sildenafil (VIAGRA) 100 MG tablet Take 100 mg by mouth. Take one tablet 30 mins before intercourse      No current facility-administered medications for this visit.    Functional Status:   In your present state of health, do you have any difficulty performing the following activities: 01/31/2016 01/23/2016  Hearing? N N  Vision? N N  Difficulty concentrating or making decisions? N N  Walking or climbing stairs? N N  Dressing or bathing? N N  Doing errands, shopping? N N  Preparing Food and eating ? Y -  Using the Toilet? N -  In the past six months, have you accidently leaked urine? N -  Do you have problems with loss of bowel control? N -  Managing your Medications? Y -  Managing your Finances? N -  Housekeeping or managing your Housekeeping? N -    Fall/Depression Screening:    Fall Risk  01/31/2016 01/27/2015 09/02/2013  Falls in the past year? No No No  Risk for fall due to : History of fall(s) - -  Risk for fall due to (comments): 2 falls more than a year ago - -   PHQ 2/9 Scores 01/31/2016 04/01/2015 09/02/2013  PHQ - 2 Score _0 PHQ- 9 Score _1 Assessment:   COPD: Patient has had 4 inpatient admissions related to his COPD. He recently quit smoking. Diabetes: Patient checks blood sugars intermittently, encouraged to check daily and document Depression/Schizophrenia: patient sees psychiatrist for management.  Plan:  [facility] CM Care Plan Problem One        Most Recent Value   Care Plan Problem One  COPD knowledge deficit    Role Documenting the Problem One  Care Management Coordinator   Care  Plan for Problem One  Active   THN Long Term Goal (31-90 days)  Patient will be able to verbalize COPD exacerbation symptoms and seek earlier assistance    [facility] Long Term Goal Start Date  01/31/16   Interventions for Problem One Long Term Goal  Using teachback reviewed COPD zones and reviewed patient symptoms and when to seek help. Reviewed medications, encouraged patient compliance. Praised patient for quitting smoking   THN CM Short Term Goal #1 (0-30 days)  Patient will be better adherent with COPD medications    THN CM Short Term Goal #1 Start Date  01/31/16   Interventions for Short Term Goal #1  Gave patient medication box, reviewed medications with patient and  his neighbor who assists him, assessed for barriers to taking COPD medications.    THN CM Care Plan Problem Two        Most Recent Value   Care Plan Problem Two  At risk for readmission as evidenced by 4 hospital admissions in 6 months   Role Documenting the Problem Two  Care Management [place] for Problem Two  Active   THN CM Short Term Goal #1 (0-30 days)  Patient will not have any COPD related hospitalizations in the next 30 days   THN CM Short Term Goal #1 Start Date  01/31/16   Interventions for Short Term Goal #2   Using teachback reviewed COPD zones and COPD exacerbation symptoms with patient      Patient will attempt to be more adherent to COPD medications Plan to visit with patient at MD appointment 2/21. Plan to continue Transition of care program Will visit in March for routine home visit  [place]. [name], RN, BSN, [place] [phone]

## 2016-02-02 ENCOUNTER — Encounter: Payer: Self-pay | Admitting: *Deleted

## 2016-02-08 ENCOUNTER — Encounter: Payer: Self-pay | Admitting: Family Medicine

## 2016-02-08 ENCOUNTER — Ambulatory Visit (INDEPENDENT_AMBULATORY_CARE_PROVIDER_SITE_OTHER): Payer: Commercial Managed Care - HMO | Admitting: Family Medicine

## 2016-02-08 ENCOUNTER — Other Ambulatory Visit: Payer: Self-pay | Admitting: *Deleted

## 2016-02-08 VITALS — BP 102/60 | HR 100 | Resp 18 | Wt 256.0 lb

## 2016-02-08 VITALS — BP 102/60 | Wt 259.0 lb

## 2016-02-08 DIAGNOSIS — J441 Chronic obstructive pulmonary disease with (acute) exacerbation: Secondary | ICD-10-CM

## 2016-02-08 DIAGNOSIS — E119 Type 2 diabetes mellitus without complications: Secondary | ICD-10-CM

## 2016-02-08 DIAGNOSIS — Z09 Encounter for follow-up examination after completed treatment for conditions other than malignant neoplasm: Secondary | ICD-10-CM | POA: Insufficient documentation

## 2016-02-08 DIAGNOSIS — I10 Essential (primary) hypertension: Secondary | ICD-10-CM | POA: Diagnosis not present

## 2016-02-08 DIAGNOSIS — E669 Obesity, unspecified: Secondary | ICD-10-CM | POA: Diagnosis not present

## 2016-02-08 DIAGNOSIS — E1169 Type 2 diabetes mellitus with other specified complication: Secondary | ICD-10-CM

## 2016-02-08 DIAGNOSIS — Z720 Tobacco use: Secondary | ICD-10-CM

## 2016-02-08 MED ORDER — AMLODIPINE BESYLATE 5 MG PO TABS: 5.0000 mg | ORAL_TABLET | Freq: Every day | ORAL | Status: DC

## 2016-02-08 MED ORDER — ALBUTEROL SULFATE (2.5 MG/3ML) 0.083% IN NEBU
2.5000 mg | INHALATION_SOLUTION | Freq: Once | RESPIRATORY_TRACT | Status: AC
  Administered 2016-02-08: 2.5 mg via RESPIRATORY_TRACT

## 2016-02-08 MED ORDER — IPRATROPIUM BROMIDE 0.02 % IN SOLN
0.5000 mg | Freq: Once | RESPIRATORY_TRACT | Status: AC
Start: 2016-02-08 — End: 2016-02-08
  Administered 2016-02-08: 0.5 mg via RESPIRATORY_TRACT

## 2016-02-08 NOTE — Assessment & Plan Note (Addendum)
Over corrected, reduce amlodipine to 5 mg    Case wirker re check BP in 1 week, contact office with concerns

## 2016-02-08 NOTE — Progress Notes (Signed)
Subjective:    Patient ID: Matthew Molina, male    DOB: 1951-11-27, 65 y.o.   MRN: [id number]  HPI Pt in with case worker from [facility] for recent hospital f/u for COPD exacerbation. Currently feels weak,  and breathing still not optimal, cost of neb solution and patches cited as a problem. Will start patches in am as he knows he needs to quit Denies polyuria, polydipsia, blurred vision , or hypoglycemic episodes.    Review of Systems See HPI Denies recent fever or chills.Feels weak/ light headed Denies sinus pressure, nasal congestion, ear pain or sore throat. Denies chest congestion, productive cough or wheezing. Denies chest pains, palpitations and leg swelling Denies abdominal pain, nausea, vomiting,diarrhea or constipation.   Denies dysuria, frequency, hesitancy or incontinence. Denies uncontrolled  joint pain, swelling and limitation in mobility. Denies headaches, seizures, numbness, or tingling. Denies uncontrolled depression, anxiety or insomnia. Denies skin break down or rash.        Objective:   Physical Exam BP 102/60 mmHg  Pulse 100  Resp 18  Wt 256 lb (116.121 kg)  SpO2 95% Patient alert and oriented and in no cardiopulmonary distress.  HEENT: No facial asymmetry, EOMI,   oropharynx pink and moist.  Neck supple no JVD, no mass.  Chest: Clear to auscultation bilaterally.Decreased air entry bilaterally  CVS: S1, S2 no murmurs, no S3.Regular rate.  ABD: Soft non tender.   Ext: No edema  MS: Adequate though reduced  ROM spine, shoulders, hips and knees.  Skin: Intact, no ulcerations or rash noted.  Psych: Good eye contact, normal affect. Memory intact not anxious or depressed appearing.  CNS: CN 2-12 intact, power,  normal throughout.no focal deficits noted.        Assessment & Plan:  Essential hypertension Over corrected, reduce amlodipine to 5 mg    Case wirker re check BP in 1 week, contact office with concerns  Hospital discharge  follow-up Reports improved breathing, but not taking meds as prescribed, cost of meds and issue , neb solution and patches, still smoking, also not using spiriva daily, will address all 3 issues, will start using the 21 mcg patches he has in am , has 10 , alternative, more affordable product to be started Case worker present to assist with care co ordination, pt also agrees to [place] involvement in the hope that he will have improved health and reduced hospitalizations  Tobacco abuse Patient counseled for approximately 5 minutes regarding the health risks of ongoing nicotine use, specifically all types of cancer, heart disease, stroke and respiratory failure. The options available for help with cessation ,the behavioral changes to assist the process, and the option to either gradully reduce usage  Or abruptly stop.is also discussed. Pt is also encouraged to set specific goals in number of cigarettes used daily, as well as to set a quit date.  Number of cigarettes/cigars currently smoking daily: 20, plans to substitute with replacement therapy starting in am   Diabetes mellitus type 2 in obese [facility]) Mr. Marg is reminded of the importance of commitment to daily physical activity for 30 minutes or more, as able and the need to limit carbohydrate intake to 30 to 60 grams per meal to help with blood sugar control.   The need to take medication as prescribed, test blood sugar as directed, and to call between visits if there is a concern that blood sugar is uncontrolled is also discussed.   Mr. Eickhoff is reminded of the importance of daily  foot exam, annual eye examination, and good blood sugar, blood pressure and cholesterol control.  Diabetic Labs Latest Ref Rng 01/26/2016 01/25/2016 01/24/2016 01/23/2016 01/22/2016  HbA1c <5.7 % - - - - -  Microalbumin Not estab mg/dL - - - - -  Micro/Creat Ratio <30 mcg/mg creat - - - - -  Chol 125 - 200 mg/dL - - - - -  HDL >=40 mg/dL - - - - -  Calc LDL <130 mg/dL -  - - - -  Triglycerides <150 mg/dL - - - - -  Creatinine 0.61 - 1.24 mg/dL 1.52(H) 1.56(H) 1.75(H) 1.73(H) 1.63(H)   BP/Weight 02/08/2016 02/08/2016 01/31/2016 01/27/2016 01/26/2016 01/22/2016 99991111  Systolic BP A999333 [id number] [id number] [id number]  Diastolic BP 60 60 60 66 74 - 70  Wt. (Lbs) 259 256 260 256.8 - 256.4 258.12  BMI 34.18 33.78 34.31 34.82 - 33.83 34.06   Foot/eye exam completion dates Latest Ref Rng 08/30/2015 01/27/2015  Eye Exam No Retinopathy - No Retinopathy  Foot Form Completion - Done -   . Controlled, no change in medication Mr. Timian is reminded of the importance of commitment to daily physical activity for 30 minutes or more, as able and the need to limit carbohydrate intake to 30 to 60 grams per meal to help with blood sugar control.   The need to take medication as prescribed, test blood sugar as directed, and to call between visits if there is a concern that blood sugar is uncontrolled is also discussed.   Mr. Sisley is reminded of the importance of daily foot exam, annual eye examination, and good blood sugar, blood pressure and cholesterol control.  Diabetic Labs Latest Ref Rng 01/26/2016 01/25/2016 01/24/2016 01/23/2016 01/22/2016  HbA1c <5.7 % - - - - -  Microalbumin Not estab mg/dL - - - - -  Micro/Creat Ratio <30 mcg/mg creat - - - - -  Chol 125 - 200 mg/dL - - - - -  HDL >=40 mg/dL - - - - -  Calc LDL <130 mg/dL - - - - -  Triglycerides <150 mg/dL - - - - -  Creatinine 0.61 - 1.24 mg/dL 1.52(H) 1.56(H) 1.75(H) 1.73(H) 1.63(H)   BP/Weight 02/08/2016 02/08/2016 01/31/2016 01/27/2016 01/26/2016 01/22/2016 [id number]  Systolic BP A999333 A999333 [id number] [id number] - [id number]  Diastolic BP 60 60 60 66 74 - 70  Wt. (Lbs) 259 256 260 256.8 - 256.4 258.12  BMI 34.18 33.78 34.31 34.82 - 33.83 34.06   Foot/eye exam completion dates Latest Ref Rng 08/30/2015 01/27/2015  Eye Exam No Retinopathy - No Retinopathy  Foot Form Completion - Done -            COPD with exacerbation Currently resolved, the  importance of Quitting smoking for improved health and longer life discussed. Assistance re cost of neb solution to be followed up by case worker, stressed the need  to consistently take meds for the disease

## 2016-02-08 NOTE — Assessment & Plan Note (Signed)
Patient counseled for approximately 5 minutes regarding the health risks of ongoing nicotine use, specifically all types of cancer, heart disease, stroke and respiratory failure. The options available for help with cessation ,the behavioral changes to assist the process, and the option to either gradully reduce usage  Or abruptly stop.is also discussed. Pt is also encouraged to set specific goals in number of cigarettes used daily, as well as to set a quit date.  Number of cigarettes/cigars currently smoking daily: 20, plans to substitute with replacement therapy starting in am

## 2016-02-08 NOTE — Assessment & Plan Note (Signed)
Currently resolved, the importance of Quitting smoking for improved health and longer life discussed. Assistance re cost of neb solution to be followed up by case worker, stressed the need  to consistently take meds for the disease

## 2016-02-08 NOTE — Assessment & Plan Note (Signed)
Matthew Molina is reminded of the importance of commitment to daily physical activity for 30 minutes or more, as able and the need to limit carbohydrate intake to 30 to 60 grams per meal to help with blood sugar control.   The need to take medication as prescribed, test blood sugar as directed, and to call between visits if there is a concern that blood sugar is uncontrolled is also discussed.   Matthew Molina is reminded of the importance of daily foot exam, annual eye examination, and good blood sugar, blood pressure and cholesterol control.  Diabetic Labs Latest Ref Rng 01/26/2016 01/25/2016 01/24/2016 01/23/2016 01/22/2016  HbA1c <5.7 % - - - - -  Microalbumin Not estab mg/dL - - - - -  Micro/Creat Ratio <30 mcg/mg creat - - - - -  Chol 125 - 200 mg/dL - - - - -  HDL >=40 mg/dL - - - - -  Calc LDL <130 mg/dL - - - - -  Triglycerides <150 mg/dL - - - - -  Creatinine 0.61 - 1.24 mg/dL 1.52(H) 1.56(H) 1.75(H) 1.73(H) 1.63(H)   BP/Weight 02/08/2016 02/08/2016 01/31/2016 01/27/2016 01/26/2016 01/22/2016 [id number]  Systolic BP A999333 A999333 A999333 [id number] - [id number]  Diastolic BP 60 60 60 66 74 - 70  Wt. (Lbs) 259 256 260 256.8 - 256.4 258.12  BMI 34.18 33.78 34.31 34.82 - 33.83 34.06   Foot/eye exam completion dates Latest Ref Rng 08/30/2015 01/27/2015  Eye Exam No Retinopathy - No Retinopathy  Foot Form Completion - Done -   . Controlled, no change in medication Matthew Molina is reminded of the importance of commitment to daily physical activity for 30 minutes or more, as able and the need to limit carbohydrate intake to 30 to 60 grams per meal to help with blood sugar control.   The need to take medication as prescribed, test blood sugar as directed, and to call between visits if there is a concern that blood sugar is uncontrolled is also discussed.   Matthew Molina is reminded of the importance of daily foot exam, annual eye examination, and good blood sugar, blood pressure and cholesterol control.  Diabetic Labs Latest Ref Rng  01/26/2016 01/25/2016 01/24/2016 01/23/2016 01/22/2016  HbA1c <5.7 % - - - - -  Microalbumin Not estab mg/dL - - - - -  Micro/Creat Ratio <30 mcg/mg creat - - - - -  Chol 125 - 200 mg/dL - - - - -  HDL >=40 mg/dL - - - - -  Calc LDL <130 mg/dL - - - - -  Triglycerides <150 mg/dL - - - - -  Creatinine 0.61 - 1.24 mg/dL 1.52(H) 1.56(H) 1.75(H) 1.73(H) 1.63(H)   BP/Weight 02/08/2016 02/08/2016 01/31/2016 01/27/2016 01/26/2016 01/22/2016 [id number]  Systolic BP A999333 A999333 A999333 [id number] - [id number]  Diastolic BP 60 60 60 66 74 - 70  Wt. (Lbs) 259 256 260 256.8 - 256.4 258.12  BMI 34.18 33.78 34.31 34.82 - 33.83 34.06   Foot/eye exam completion dates Latest Ref Rng 08/30/2015 01/27/2015  Eye Exam No Retinopathy - No Retinopathy  Foot Form Completion - Done -

## 2016-02-08 NOTE — Patient Outreach (Addendum)
[facility]) Care Management  02/08/2016  Matthew Molina 12-25-1950 [id number]   Co-Visit with patient and [name] at MD office.  Subjective: Patient states he is very tired and "weak" today, thinks it is "from smoking" Patient states he is smoking 1 ppd but wants to stop, using nicotine patches, but states they are expensive. Patient also states he is not using inhalers and nebulizer regularly, sometimes due to cost and sometimes just no specific reason.   Assessment: COPD-patient states he is not taking medications for breathing regularly especially the nebs and Spiriva. Patient given neb treatment during office visit.  Medication assistance-patient reports he needs assistance with breathing medications. MD will prescribe the nicotine  Inhaler which is has better coverage under his health plans  PLAN: [facility] CM Care Plan Problem One        Most Recent Value   Care Plan Problem One  COPD knowledge deficit    Role Documenting the Problem One  Care Management Coordinator   Care Plan for Problem One  Active   THN Long Term Goal (31-90 days)  Patient will be able to verbalize COPD exacerbation symptoms and seek earlier assistance    [facility] Long Term Goal Start Date  01/31/16   Interventions for Problem One Long Term Goal  Encouraged patient to be more adherent to medications for COPD   THN CM Short Term Goal #1 (0-30 days)  Patient will be better adherent with COPD medications    THN CM Short Term Goal #1 Start Date  01/31/16    [facility] CM Care Plan Problem Two        Most Recent Value   Care Plan Problem Two  At risk for readmission as evidenced by 4 hospital admissions in 6 months   Role Documenting the Problem Two  Care Management Coordinator   Care Plan for Problem Two  Active   THN CM Short Term Goal #1 (0-30 days)  Patient will not have any COPD related hospitalizations in the next 30 days   THN CM Short Term Goal #1 Start Date  01/31/16   Interventions for Short  Term Goal #2   Discussion with patient and MD around COPD management    MD office filled out paperwork for [facility] program. RNCM will Visit next week to check BP as MD changed BP medication this visit due to hypotension and patient weakness RNCM will Refer to [place] for medication assistance  Allegra Cerniglia E. [name], RN, BSN, [place] [phone]

## 2016-02-08 NOTE — Addendum Note (Signed)
Addended by: Nicholaus Bloom on: 02/08/2016 04:23 PM   Modules accepted: Orders

## 2016-02-08 NOTE — Patient Instructions (Signed)
F/u in 4.5 weeks, call if you need me sooner  Reduced dose of amlodipine to 5 mg daily start tomorrow,sent today  Use the last 10 patches, then transition to the inhalers for use as prescribed. NO SMOKING with either  HBA1C , chem 7 and EGFR in 4.5 weeks  Neb treatment today  Neb treatments 3 times daily, case worker to [facility] with med affordability

## 2016-02-08 NOTE — Assessment & Plan Note (Signed)
Reports improved breathing, but not taking meds as prescribed, cost of meds and issue , neb solution and patches, still smoking, also not using spiriva daily, will address all 3 issues, will start using the 21 mcg patches he has in am , has 10 , alternative, more affordable product to be started Case worker present to assist with care co ordination, pt also agrees to [place] involvement in the hope that he will have improved health and reduced hospitalizations

## 2016-02-14 ENCOUNTER — Other Ambulatory Visit: Payer: Self-pay | Admitting: *Deleted

## 2016-02-14 ENCOUNTER — Encounter: Payer: Self-pay | Admitting: *Deleted

## 2016-02-14 NOTE — Patient Outreach (Signed)
[facility]) Care Management   02/14/2016  Matthew Molina [birth year] [id number]  Matthew Molina is an 65 y.o. male  Subjective:  "My sister is picking me up at 11:45 to go to prayer" "My blood sugar was elevated this weekend" "I quit smoking again on Saturday"  Patient states he does not like to read "too much" but would appreciate some brief educational materials RNCM could provide on diabetes or COPD Patient states he is not doing much better at taking spiriva, agrees to move inhaler to table where medication box is and will attempt to use in the morning with morning medications.  Patient asking questions about upcoming procedure.   Reports neighbor, Parke Simmers, still assisting with medication management. Objective:   BP 120/60 mmHg  Pulse 102  Resp 20  Wt 256 lb (116.121 kg)  SpO2 94% Review of Systems  Constitutional: Negative.   HENT: Negative.   Eyes: Negative.   Respiratory: Negative.   Cardiovascular: Negative.   Gastrointestinal: Negative.   Genitourinary: Negative.   Musculoskeletal: Negative.        Cramps in fingers and toes  Skin: Negative.   Neurological: Negative.   Endo/Heme/Allergies: Negative.   Psychiatric/Behavioral: The patient is nervous/anxious.     Physical Exam  Constitutional: He is oriented to person, place, and time. He appears well-developed and well-nourished.  Cardiovascular: Normal rate and regular rhythm.   Respiratory: Effort normal.  GI: Soft. Bowel sounds are normal.  Musculoskeletal: Normal range of motion.  Neurological: He is alert and oriented to person, place, and time.  Skin: Skin is warm and dry.  Psychiatric: He has a normal mood and affect. His behavior is normal. Judgment and thought content normal.    Current Medications:   Current Outpatient Prescriptions  Medication Sig Dispense Refill  . albuterol (PROVENTIL HFA;VENTOLIN HFA) 108 (90 BASE) MCG/ACT inhaler Inhale 2 puffs into the lungs every 6 (six)  hours as needed for wheezing or shortness of breath. 1 Inhaler 2  . amLODipine (NORVASC) 5 MG tablet Take 1 tablet (5 mg total) by mouth daily. 30 tablet 3  . aspirin EC 81 MG tablet Take 81 mg by mouth daily.    . budesonide-formoterol (SYMBICORT) 80-4.5 MCG/ACT inhaler INHALE 2 PUFFS INTO LUNGS TWICE DAILY. (Patient taking differently: Inhale 2 puffs into the lungs 2 (two) times daily. ) 10.2 g 3  . metFORMIN (GLUCOPHAGE) 1000 MG tablet TAKE 1 TABLET BY MOUTH TWICE DAILY WITH FOOD FOR DIABETES. 60 tablet 3  . nicotine (NICODERM CQ - DOSED IN MG/24 HOURS) 21 mg/24hr patch Place 1 patch (21 mg total) onto the skin daily. 28 patch 0  . polyethylene glycol powder (GLYCOLAX/MIRALAX) powder MIX 1 CAPFUL (17G) IN 8 OUNCES OF JUICE/WATER AND DRINK ONCE DAILY. 527 g 3  . pravastatin (PRAVACHOL) 40 MG tablet TAKE (1) TABLET BY MOUTH AT BEDTIME FOR CHOLESTEROL. 30 tablet 5  . risperiDONE (RISPERDAL) 1 MG tablet Take 1 tablet (1 mg total) by mouth at bedtime. Takes 5mg  total every day at bedtime 30 tablet 2  . risperidone (RISPERDAL) 4 MG tablet Take 1 tablet (4 mg total) by mouth at bedtime. 30 tablet 2  . SPIRIVA HANDIHALER 18 MCG inhalation capsule INHALE 1 CAPSULE AS DIRECTED ONCE A DAY. 30 capsule 4  . traZODone (DESYREL) 50 MG tablet One tablet at bedtime for sleep (Patient taking differently: Take 50 mg by mouth at bedtime as needed for sleep. ) 30 tablet 3  . triamterene-hydrochlorothiazide (MAXZIDE-25) 37.5-25 MG tablet  Take 1 tablet by mouth daily. 30 tablet 5  . valsartan (DIOVAN) 320 MG tablet Take 1 tablet (320 mg total) by mouth daily. 30 tablet 5  . busPIRone (BUSPAR) 15 MG tablet Take half a pill twice a day (Patient taking differently: Take 7.5 mg by mouth 2 (two) times daily. ) 30 tablet 2  . glipiZIDE (GLUCOTROL XL) 10 MG 24 hr tablet Take 1 tablet (10 mg total) by mouth daily. 30 tablet 4  . ipratropium-albuterol (DUONEB) 0.5-2.5 (3) MG/3ML SOLN Take 3 mLs by nebulization every 4 (four)  hours as needed. (Patient taking differently: Take 3 mLs by nebulization every 4 (four) hours as needed (for shortness of breath). ) 360 mL 1  . [DISCONTINUED] sildenafil (VIAGRA) 100 MG tablet Take 100 mg by mouth. Take one tablet 30 mins before intercourse      No current facility-administered medications for this visit.    Functional Status:   In your present state of health, do you have any difficulty performing the following activities: 01/31/2016 01/23/2016  Hearing? N N  Vision? N N  Difficulty concentrating or making decisions? N N  Walking or climbing stairs? N N  Dressing or bathing? N N  Doing errands, shopping? N N  Preparing Food and eating ? Y -  Using the Toilet? N -  In the past six months, have you accidently leaked urine? N -  Do you have problems with loss of bowel control? N -  Managing your Medications? Y -  Managing your Finances? N -  Housekeeping or managing your Housekeeping? N -    Fall/Depression Screening:    Fall Risk  02/14/2016 01/31/2016 01/27/2015 09/02/2013  Falls in the past year? No No No No  Risk for fall due to : - History of fall(s) - -  Risk for fall due to (comments): - 2 falls more than a year ago - -   PHQ 2/9 Scores 01/31/2016 04/01/2015 09/02/2013  PHQ - 2 Score 4 3 1   PHQ- 9 Score 6 7 3     Assessment:   COPD: needs to be adherent to COPD medications Diabetes: CBG's elevated over weekend, needs education  Smoking: patient quit again on [place]:   [facility] CM Care Plan Problem One        Most Recent Value   Care Plan Problem One  COPD knowledge deficit    Role Documenting the Problem One  Care Management [place] for Problem One  Active   [facility] Long Term Goal (31-90 days)  Patient will be able to verbalize COPD exacerbation symptoms and seek earlier assistance    [facility] Long Term Goal Start Date  01/31/16   Interventions for Problem One Long Term Goal  Encouraged patient to continue to not smoke. Reviewed ways patient may  be more adherent to using his spiriva.    [facility] CM Short Term Goal #1 (0-30 days)  Patient will be better adherent with COPD medications    [facility] CM Short Term Goal #1 Start Date  01/31/16   Interventions for Short Term Goal #1  Using teachback reviewed with patient and caregiver the importance of taking COPD medications    [facility] CM Care Plan Problem Two        Most Recent Value   Care Plan Problem Two  At risk for readmission as evidenced by 4 hospital admissions in 6 months   Role Documenting the Problem Two  Care Management [place]  for Problem Two  Active   [facility] CM Short Term Goal #1 (0-30 days)  Patient will not have any COPD related hospitalizations in the next 30 days   [facility] CM Short Term Goal #1 Start Date  01/31/16   Interventions for Short Term Goal #2   Using teachback encourage patient around sefl management of COPD and self monitoring for COPD zones     RNCM will provide some Diabetes education at next appointment RNCM reviewed upcoming upper GI instructions with patient Patient will continue to use nicotine patches and not smoke while wearing Patient will take Medicaid card to pharmacy Patient has appointments with Several doctors this month, will keep appointments Patient will attempt to increase use and adherence of spiriva inhaler  Stanton Kidney E. [name], RN, BSN, [place] [phone]

## 2016-02-17 ENCOUNTER — Telehealth: Payer: Self-pay | Admitting: Family Medicine

## 2016-02-17 NOTE — Telephone Encounter (Signed)
Is it ok to refer for overnight oximetry?

## 2016-02-17 NOTE — Telephone Encounter (Signed)
Yes OK I will sign

## 2016-02-17 NOTE — Telephone Encounter (Signed)
Patient is asking if we could order his oxygen back in home due to sob, coughing, please advise?

## 2016-02-18 ENCOUNTER — Encounter (HOSPITAL_COMMUNITY)
Admission: RE | Disposition: A | Discharge status: Home Health | Payer: Self-pay | Source: Ambulatory Visit | Attending: Gastroenterology

## 2016-02-18 ENCOUNTER — Encounter (HOSPITAL_COMMUNITY): Payer: Self-pay | Admitting: *Deleted

## 2016-02-18 ENCOUNTER — Ambulatory Visit (HOSPITAL_COMMUNITY)
Admission: RE | Admit: 2016-02-18 | Discharge: 2016-02-18 | Disposition: A | Discharge status: Home Health | Payer: Commercial Managed Care - HMO | Source: Ambulatory Visit | Attending: Gastroenterology | Admitting: Gastroenterology

## 2016-02-18 DIAGNOSIS — J449 Chronic obstructive pulmonary disease, unspecified: Secondary | ICD-10-CM | POA: Insufficient documentation

## 2016-02-18 DIAGNOSIS — F209 Schizophrenia, unspecified: Secondary | ICD-10-CM | POA: Insufficient documentation

## 2016-02-18 DIAGNOSIS — K2980 Duodenitis without bleeding: Secondary | ICD-10-CM | POA: Diagnosis not present

## 2016-02-18 DIAGNOSIS — Z7982 Long term (current) use of aspirin: Secondary | ICD-10-CM | POA: Insufficient documentation

## 2016-02-18 DIAGNOSIS — Z7984 Long term (current) use of oral hypoglycemic drugs: Secondary | ICD-10-CM | POA: Diagnosis not present

## 2016-02-18 DIAGNOSIS — B9681 Helicobacter pylori [H. pylori] as the cause of diseases classified elsewhere: Secondary | ICD-10-CM | POA: Insufficient documentation

## 2016-02-18 DIAGNOSIS — R1310 Dysphagia, unspecified: Secondary | ICD-10-CM | POA: Insufficient documentation

## 2016-02-18 DIAGNOSIS — K2950 Unspecified chronic gastritis without bleeding: Secondary | ICD-10-CM | POA: Insufficient documentation

## 2016-02-18 DIAGNOSIS — E669 Obesity, unspecified: Secondary | ICD-10-CM | POA: Insufficient documentation

## 2016-02-18 DIAGNOSIS — E785 Hyperlipidemia, unspecified: Secondary | ICD-10-CM | POA: Insufficient documentation

## 2016-02-18 DIAGNOSIS — Z79899 Other long term (current) drug therapy: Secondary | ICD-10-CM | POA: Insufficient documentation

## 2016-02-18 DIAGNOSIS — K219 Gastro-esophageal reflux disease without esophagitis: Secondary | ICD-10-CM | POA: Diagnosis not present

## 2016-02-18 DIAGNOSIS — F329 Major depressive disorder, single episode, unspecified: Secondary | ICD-10-CM | POA: Insufficient documentation

## 2016-02-18 DIAGNOSIS — E7800 Pure hypercholesterolemia, unspecified: Secondary | ICD-10-CM | POA: Diagnosis not present

## 2016-02-18 DIAGNOSIS — M1991 Primary osteoarthritis, unspecified site: Secondary | ICD-10-CM | POA: Insufficient documentation

## 2016-02-18 DIAGNOSIS — E119 Type 2 diabetes mellitus without complications: Secondary | ICD-10-CM | POA: Insufficient documentation

## 2016-02-18 DIAGNOSIS — I10 Essential (primary) hypertension: Secondary | ICD-10-CM | POA: Insufficient documentation

## 2016-02-18 DIAGNOSIS — K222 Esophageal obstruction: Secondary | ICD-10-CM | POA: Insufficient documentation

## 2016-02-18 HISTORY — PX: ESOPHAGOGASTRODUODENOSCOPY: SHX5428

## 2016-02-18 HISTORY — PX: SAVORY DILATION: SHX5439

## 2016-02-18 LAB — GLUCOSE, CAPILLARY
Glucose-Capillary: 51 mg/dL — ABNORMAL LOW (ref 65–99)
Glucose-Capillary: 128 mg/dL — ABNORMAL HIGH (ref 65–99)

## 2016-02-18 SURGERY — EGD (ESOPHAGOGASTRODUODENOSCOPY)
Anesthesia: Moderate Sedation

## 2016-02-18 MED ORDER — PROMETHAZINE HCL 25 MG/ML IJ SOLN
12.5000 mg | Freq: Once | INTRAMUSCULAR | Status: AC
  Administered 2016-02-18: 12.5 mg via INTRAVENOUS

## 2016-02-18 MED ORDER — PROMETHAZINE HCL 25 MG/ML IJ SOLN
INTRAMUSCULAR | Status: AC
  Administered 2016-02-18: 12.5 mg via INTRAVENOUS
  Filled 2016-02-18: qty 1

## 2016-02-18 MED ORDER — SODIUM CHLORIDE 0.9 % IV SOLN: INTRAVENOUS | Status: DC

## 2016-02-18 MED ORDER — MEPERIDINE HCL 100 MG/ML IJ SOLN
INTRAMUSCULAR | Status: AC
  Filled 2016-02-18: qty 2

## 2016-02-18 MED ORDER — MEPERIDINE HCL 100 MG/ML IJ SOLN
INTRAMUSCULAR | Status: DC | PRN
  Administered 2016-02-18 (×2): 25 mg via INTRAVENOUS

## 2016-02-18 MED ORDER — OMEPRAZOLE 20 MG PO CPDR: DELAYED_RELEASE_CAPSULE | ORAL | Status: DC

## 2016-02-18 MED ORDER — MIDAZOLAM HCL 5 MG/5ML IJ SOLN
INTRAMUSCULAR | Status: DC
Start: 2016-02-18 — End: 2016-02-18
  Filled 2016-02-18: qty 10

## 2016-02-18 MED ORDER — SODIUM CHLORIDE 0.9% FLUSH
INTRAVENOUS | Status: AC
  Filled 2016-02-18: qty 10

## 2016-02-18 MED ORDER — MIDAZOLAM HCL 5 MG/5ML IJ SOLN
INTRAMUSCULAR | Status: DC | PRN
  Administered 2016-02-18 (×2): 2 mg via INTRAVENOUS

## 2016-02-18 MED ORDER — MINERAL OIL PO OIL
TOPICAL_OIL | ORAL | Status: AC
  Filled 2016-02-18: qty 30

## 2016-02-18 MED ORDER — LIDOCAINE VISCOUS 2 % MT SOLN
OROMUCOSAL | Status: DC | PRN
  Administered 2016-02-18: 1 via OROMUCOSAL

## 2016-02-18 MED ORDER — DEXTROSE-NACL 5-0.9 % IV SOLN
INTRAVENOUS | Status: DC
  Administered 2016-02-18: 12:00:00 via INTRAVENOUS

## 2016-02-18 MED ORDER — STERILE WATER FOR IRRIGATION IR SOLN
Status: DC | PRN
  Administered 2016-02-18: 12:00:00

## 2016-02-18 MED ORDER — LIDOCAINE VISCOUS 2 % MT SOLN
OROMUCOSAL | Status: AC
  Filled 2016-02-18: qty 15

## 2016-02-18 NOTE — Progress Notes (Signed)
Message left at Dr Oneida Alar office to call patient with follow up appointment in June

## 2016-02-18 NOTE — Interval H&P Note (Signed)
History and Physical Interval Note:  02/18/2016 12:11 PM  Matthew Molina  has presented today for surgery, with the diagnosis of dysphagia  The various methods of treatment have been discussed with the patient and family. After consideration of risks, benefits and other options for treatment, the patient has consented to  Procedure(s) with comments: ESOPHAGOGASTRODUODENOSCOPY (EGD) (N/A) - 1145 SAVORY DILATION (N/A) as a surgical intervention .  The patient's history has been reviewed, patient examined, no change in status, stable for surgery.  I have reviewed the patient's chart and labs.  Questions were answered to the patient's satisfaction.     [employer]

## 2016-02-18 NOTE — Discharge Instructions (Signed)
I STRETCHED your esophagus DUE TO A STRICTURE. You have gastritis & DUODENITIS DUE TO ASPIRIN. I biopsied your stomach.   DRINK WATER TO KEEP YOUR URINE LIGHT YELLOW.  FOLLOW A LOW FAT/diabetic  DIET.  MEATS SHOULD BE BAKED, BROILED, OR BOILED. AVOID FRIED FOODS. SEE INFO BELOW ON A LOW FAT DIET.   START OMEPRAZOLE.  TAKE 30 MINUTES PRIOR TO YOUR MEALS TWICE DAILY FOREVER. AS LONG AS YOU NEED ASPIRIN. YOU NEED OMEPRAZOLE.  YOUR BIOPSY RESULTS WILL BE AVAILABLE IN MY CHART MAR 8 AND MY OFFICE WILL CONTACT YOU IN 10-14 DAYS WITH YOUR RESULTS.   FOLLOW UP APPT IN JUN 2017. WE CAN TALK ABOUT REDUCING YOUR DOSE OF OMEPRAZOLE AFTER YOUR NEXT OUTPATIENT VISIT.    UPPER ENDOSCOPY AFTER CARE Read the instructions outlined below and refer to this sheet in the next week. These discharge instructions provide you with general information on caring for yourself after you leave the hospital. While your treatment has been planned according to the most current medical practices available, unavoidable complications occasionally occur. If you have any problems or questions after discharge, call DR. Averly Ericson, [phone].  ACTIVITY  You may resume your regular activity, but move at a slower pace for the next 24 hours.   Take frequent rest periods for the next 24 hours.   Walking will help get rid of the air and reduce the bloated feeling in your belly (abdomen).   No driving for 24 hours (because of the medicine (anesthesia) used during the test).   You may shower.   Do not sign any important legal documents or operate any machinery for 24 hours (because of the anesthesia used during the test).    NUTRITION  Drink plenty of fluids.   You may resume your normal diet as instructed by your doctor.   Begin with a light meal and progress to your normal diet. Heavy or fried foods are harder to digest and may make you feel sick to your stomach (nauseated).   Avoid alcoholic beverages for 24 hours or as  instructed.    MEDICATIONS  You may resume your normal medications.   WHAT YOU CAN EXPECT TODAY  Some feelings of bloating in the abdomen.   Passage of more gas than usual.    IF YOU HAD A BIOPSY TAKEN DURING THE UPPER ENDOSCOPY:  Eat a soft diet IF YOU HAVE NAUSEA, BLOATING, ABDOMINAL PAIN, OR VOMITING.    FINDING OUT THE RESULTS OF YOUR TEST Not all test results are available during your visit. DR. Oneida Alar WILL CALL YOU WITHIN 14 DAYS OF YOUR PROCEDUE WITH YOUR RESULTS. Do not assume everything is normal if you have not heard from DR. Irmalee Riemenschneider, CALL HER OFFICE AT [phone].  SEEK IMMEDIATE MEDICAL ATTENTION AND CALL THE OFFICE: [phone] IF:  You have more than a spotting of blood in your stool.   Your belly is swollen (abdominal distention).   You are nauseated or vomiting.   You have a temperature over 101F.   You have abdominal pain or discomfort that is severe or gets worse throughout the day.   Gastritis/DUODENITIS  Gastritis is an inflammation (the body's way of reacting to injury and/or infection) of the stomach. DUODENITIS is an inflammation (the body's way of reacting to injury and/or infection) of the FIRST PART OF THE SMALL INTESTINES. It is often caused by bacterial (germ) infections. It can also be caused BY ASPIRIN, BC/GOODY POWDER'S, (IBUPROFEN) MOTRIN, OR ALEVE (NAPROXEN), chemicals (including alcohol), SPICY FOODS, and  medications. This illness may be associated with generalized malaise (feeling tired, not well), UPPER ABDOMINAL STOMACH cramps, and fever. One common bacterial cause of gastritis is an organism known as H. Pylori. This can be treated with antibiotics.    ESOPHAGEAL STRICTURE  Esophageal strictures can be caused by stomach acid backing up into the tube that carries food from the mouth down to the stomach (lower esophagus).  TREATMENT There are a number of medicines used to treat reflux/stricture, including: Antacids.   ZANTAC Proton-pump inhibitors: PRILOSEC   HOME CARE INSTRUCTIONS Eat 2-3 hours before going to bed.  Try to reach and maintain a healthy weight.  Do not eat just a few very large meals. Instead, eat 4 TO 6 smaller meals throughout the day.  Try to identify foods and beverages that make your symptoms worse, and avoid these.  Avoid tight clothing.  Do not exercise right after eating.

## 2016-02-18 NOTE — H&P (View-Only) (Signed)
Subjective:    Patient ID: Matthew Molina, male    DOB: [birth year], 65 y.o.   MRN: [id number]  [name], MD   HPI LAST SEEN 2014 FOR COLONOSCOPY. Trouble swallowing pills and FOOD FOR A WHILE.  HAPPENS 2-3 TIMES A  WEEK. EVENTUALLY STUFF GOES DOWN. DOESN'T HAVE TO INDUCE VOMITING. NO WEIGHT LOSS. BMs: last time moved SUN NIGHT, ABOUT 2-3X/WEEK. SOB: BASELINE.  RARE HEARTBURN: 2-3X/MO. PT DENIES FEVER, CHILLS, HEMATOCHEZIA,  nausea, vomiting, melena, diarrhea, CHEST PAIN, CHANGE IN BOWEL IN HABITS, constipation, abdominal pain, OR problems with sedation.   Past Medical History  Diagnosis Date  . Nicotine addiction   . Schizophrenia ([place])   . Hyperlipidemia   . Obesity   . Diabetes mellitus   . Arthritis   . Acute respiratory failure with hypoxia ([place])   . Acute kidney injury ([place])   . Multiple lung nodules on CT 04/02/2015  . Depression   . COPD (chronic obstructive pulmonary disease) ([place])   . Diabetes mellitus without complication ([place])   . Hypertension   . Hypercholesterolemia    Past Surgical History  Procedure Laterality Date  . Colonoscopy    . Colonoscopy N/A 04/01/2013    Procedure: COLONOSCOPY;  Surgeon: Danie Binder, MD;  Location: [facility] ENDO SUITE;  Service: Endoscopy;  Laterality: N/A;  10:00 AM-moved to [place] notified pt  . Cataract extraction w/phaco Left 11/20/2013    Procedure: CATARACT EXTRACTION PHACO AND INTRAOCULAR LENS PLACEMENT (IOC);  Surgeon: Tonny Branch, MD;  Location: [facility] ORS;  Service: Ophthalmology;  Laterality: Left;  CDE:10.26  . Cataract extraction w/phaco Right 12/08/2013    Procedure: RIGHT EYE CATARACT EXTRACTION PHACO AND INTRAOCULAR LENS PLACEMENT ;  Surgeon: Tonny Branch, MD;  Location: [facility] ORS;  Service: Ophthalmology;  Laterality: Right;  CDE 12.38  . Eye surgery Left 11/2013    cataract extraction  . Shoulder surgery     Allergies  Allergen Reactions  . Sertraline Hcl     Stomach upset/pain  . Wellbutrin [Bupropion] Other  (See Comments)    Makes stomach hurt  . Wellbutrin [Bupropion]   . Zoloft [Sertraline Hcl]     Current Outpatient Prescriptions  Medication Sig Dispense Refill  . albuterol (PROVENTIL HFA;VENTOLIN HFA) 108 (90 BASE) MCG/ACT inhaler Inhale 2 puffs into the lungs every 6 (six) hours as needed for wheezing or shortness of breath.    Marland Kitchen amLODipine (NORVASC) 10 MG tablet Take 1 tablet (10 mg total) by mouth daily.    Marland Kitchen aspirin EC 81 MG tablet Take 81 mg by mouth daily.    Dellis Anes) 80-4.5 MCG/ACT inhaler INHALE 2 PUFFS INTO LUNGS TWICE DAILY. (Patient taking differently: Inhale 2 puffs into the lungs 2 (two) times daily. )    . busPIRone (BUSPAR) 15 MG tablet Take half a pill twice a day (Patient taking differently: Take 7.5 mg by mouth 2 (two) times daily. )    . GLUCOTROL XL 10 MG 24 hr tablet Take 1 tablet (10 mg total) by mouth daily.    Marland Kitchen ipratropium-albuterol (DUONEB) 0.5-2.5 (3) MG/3ML SOLN Take 3 mLs by nebulization every 4 (four) hours as needed. (Patient taking differently: Take 3 mLs by nebulization every 4 (four) hours as needed (for shortness of breath). )    . metFORMIN (GLUCOPHAGE) 1000 MG tablet TAKE 1 TABLET BY MOUTH TWICE DAILY WITH FOOD FOR DIABETES.    .  (GLYCOLAX/MIRALAX) powder MIX 1 CAPFUL (17G) IN 8 OUNCES OF JUICE/WATER AND DRINK ONCE  DAILY.    Marland Kitchen PRAVACHOL 40 MG tablet TAKE (1) TABLET BY MOUTH AT BEDTIME FOR CHOLESTEROL.    Marland Kitchen risperdal Take 1 tablet (1 mg total) by mouth at bedtime. Takes 5mg  total every day at bedtime    . risperdal Take 1 tablet (4 mg total) by mouth at bedtime.    Marland Kitchen SPIRIVA HANDIHALER INHALE 1 CAPSULE AS DIRECTED ONCE A DAY.    . traZODone (DESYREL) 50 MG tablet One tablet at bedtime for sleep (Patient taking differently: Take 50 mg by mouth at bedtime as needed for sleep. )     HCTZ Take 1 tablet by mouth daily.    . valsartan (DIOVAN) 320 MG tablet Take 1 tablet (320 mg total) by mouth daily.    Marland Kitchen levofloxacin (LEVAQUIN) 500 MG tablet Take 1 tablet  (500 mg total) by mouth daily. (Patient not taking: Reported on 01/27/2016)    .      Marland Kitchen        Review of Systems PER HPI OTHERWISE ALL SYSTEMS ARE NEGATIVE.    Objective:   Physical Exam  Constitutional: He is oriented to person, place, and time. He appears well-developed and well-nourished. No distress.  HENT:  Head: Normocephalic and atraumatic.  Mouth/Throat: Oropharynx is clear and moist. No oropharyngeal exudate.  POOR DENTITION  Eyes: Pupils are equal, round, and reactive to light. No scleral icterus.  Neck: Normal range of motion. Neck supple.  Cardiovascular: Normal rate, regular rhythm and normal heart sounds.   Pulmonary/Chest: Effort normal and breath sounds normal. No respiratory distress.  Abdominal: Soft. Bowel sounds are normal. He exhibits no distension. There is tenderness. There is no rebound and no guarding.  MILD RUQ/RLQ TTP  Musculoskeletal: He exhibits no edema.  WALKS ASSISTED WITH A CANE.  Lymphadenopathy:    He has no cervical adenopathy.  Neurological: He is alert and oriented to person, place, and time.  NO  NEW FOCAL DEFICITS  Psychiatric: He has a normal mood and affect.  Vitals reviewed.     Assessment & Plan:

## 2016-02-18 NOTE — Op Note (Signed)
[facility] [address] [place], [address]   ENDOSCOPY PROCEDURE REPORT  PATIENT: Matthew Molina, Matthew Molina  MR#: [id number] BIRTHDATE: [birth year] , 19  yrs. old GENDER: male  ENDOSCOPIST: Danie Binder, MD REFFERED [id number] Moshe Cipro, M.D.  PROCEDURE DATE:  20-Feb-2016 PROCEDURE:   EGD with biopsy and EGD with dilatation over guidewire   INDICATIONS:1.  dysphagia. ASA DAILY WITHOUT A PPI. MEDICATIONS: Demerol 50 mg IV and Versed 4 mg IV  MD STARTED SEDATION: 1212 PROCEDURE COMPLETE: 1235 TOPICAL ANESTHETIC: Viscous Xylocaine  DESCRIPTION OF PROCEDURE:   After the risks benefits and alternatives of the procedure were thoroughly explained, informed consent was obtained.  The [id number]  endoscope was introduced through the mouth and advanced to the second portion of the duodenum. The instrument was slowly withdrawn as the mucosa was carefully examined.  Prior to withdrawal of the scope, the guidwire was placed.  The esophagus was dilated successfully.  The patient was recovered in endoscopy and discharged home in satisfactory condition. Estimated blood loss is zero unless otherwise noted in this procedure report.   ESOPHAGUS: A stricture was found at the gastroesophageal junction. The stenosis was traversable with the endoscope.   STOMACH: Moderate erosive gastritis (inflammation) was found in the gastric body and gastric antrum.  Multiple biopsies were performed using cold forceps.   DUODENUM: Mild duodenal inflammation was found in the duodenal bulb.   The duodenal mucosa showed no abnormalities in the 2nd part of the duodenum.   Dilation was then performed at the gastroesphageal junction Dilator: Savary over guidewire Size(s): 14-17 MM Resistance: minimal Heme: none  COMPLICATIONS: There were no immediate complications.  ENDOSCOPIC IMPRESSION: 1.   Stricture at the gastroesophageal junction 2.   MODERATE Erosive gastritis AND MILD NON-EROSIVE  DUODENITIS  RECOMMENDATIONS: DRINK WATER TO KEEP URINE LIGHT YELLOW. FOLLOW A LOW FAT/diabetic DIET. START OMEPRAZOLE 30 MINS PRIOR TO MEALS BID FOREVER. AWAIT BIOPSY RESULTS. FOLLOW UP APPT IN JUN 2017.  WILL CONSIDER REDUCING OMEPRAZOLE TO ONCE DAILY.   eSigned:  Danie Binder, MD 2016/02/20 2:03 PM   CPT CODES: ICD CODES:  The ICD and CPT codes recommended by this software are interpretations from the data that the clinical staff has captured with the software.  The verification of the translation of this report to the ICD and CPT codes and modifiers is the sole responsibility of the health care institution and practicing physician where this report was generated.  [employer]. will not be held responsible for the validity of the ICD and CPT codes included on this report.  AMA assumes no liability for data contained or not contained herein. CPT is a Designer, television/film set of the [employer].

## 2016-02-21 ENCOUNTER — Ambulatory Visit (HOSPITAL_COMMUNITY): Payer: Self-pay | Admitting: Psychiatry

## 2016-02-21 ENCOUNTER — Other Ambulatory Visit (HOSPITAL_COMMUNITY): Payer: Self-pay | Admitting: Psychiatry

## 2016-02-21 ENCOUNTER — Telehealth (HOSPITAL_COMMUNITY): Payer: Self-pay | Admitting: *Deleted

## 2016-02-21 NOTE — Telephone Encounter (Signed)
Order for overnight oximetry sent to [employer]

## 2016-02-22 ENCOUNTER — Ambulatory Visit: Payer: Self-pay | Admitting: Podiatry

## 2016-02-22 ENCOUNTER — Encounter (HOSPITAL_COMMUNITY): Payer: Self-pay | Admitting: Gastroenterology

## 2016-02-22 ENCOUNTER — Other Ambulatory Visit: Payer: Self-pay | Admitting: Pharmacist

## 2016-02-22 ENCOUNTER — Other Ambulatory Visit: Payer: Self-pay

## 2016-02-22 MED ORDER — BUDESONIDE-FORMOTEROL FUMARATE 80-4.5 MCG/ACT IN AERO: INHALATION_SPRAY | RESPIRATORY_TRACT | Status: DC

## 2016-02-22 NOTE — Patient Outreach (Signed)
[facility]) Care Management  02/22/2016  MARTINUS ESTEVES 12-May-1951 [id number]   Matthew Molina is a 65yo who was referred to [place] from [place] for medication assistance and medication management.  I made outreach call to patient.  Patient confirms difficulty affording his nicotine patches, glucometer test strips, and DuoNebs.  Patient confirms that he has all his medications at this time.  Patient has [facility].  Both test strips and DuoNebs should be covered under patient's insurance plan.  I provided patient with phone number for [facility] 1-800-QUITNOW to assist patient in obtaining free nicotine patches.  Patient gave me permission to reach out to [facility] regarding prescription cost of test strips and DuoNebs.  Initial home visit scheduled for 02/24/16 at 2:30 PM.     Elisabeth Most, Pharm.D. Pharmacy Resident [place] [phone]

## 2016-02-23 ENCOUNTER — Other Ambulatory Visit: Payer: Self-pay | Admitting: Family Medicine

## 2016-02-23 ENCOUNTER — Other Ambulatory Visit: Payer: Self-pay | Admitting: *Deleted

## 2016-02-23 NOTE — Patient Outreach (Signed)
Call to patient. No answer, left VM requesting call back. Matthew Molina. [name], RN, BSN, [place] [phone]

## 2016-02-24 ENCOUNTER — Other Ambulatory Visit: Payer: Self-pay | Admitting: Pharmacist

## 2016-02-24 NOTE — Patient Outreach (Signed)
[facility]) Care Management  [place]   02/24/2016  Matthew Molina 09-Jun-1951 [id number]  Subjective: Matthew Molina is a 65yo who was referred to [place] from [place] for medication assistance and medication management.  I made initial home visit along with Matthew Molina, PharmD Candidate.  Patient's neighbor Matthew Molina was also present for the second half of the visit.    Patient with reports of difficulty affording his nicotine patches, glucometer test strips, and ipratropium-albuterol.    Patient reports he has been using nicotine '21mg'$  patches for approximately 3 weeks and has remained tobacco free since 02/13/16.    Patient reports improved adherence with his inhaler medications and confirms that he is now using both Symbicort and Spiriva as prescribed.    Objective:   Current Medications: Current Outpatient Prescriptions  Medication Sig Dispense Refill  . ACCU-CHEK AVIVA PLUS test strip TEST BLOOD SUGAR ONCE DAILY AS DIRECTED. 50 each 0  . acetaminophen (TYLENOL) 500 MG tablet Take 500 mg by mouth daily as needed.    Marland Kitchen albuterol (PROVENTIL HFA;VENTOLIN HFA) 108 (90 BASE) MCG/ACT inhaler Inhale 2 puffs into the lungs every 6 (six) hours as needed for wheezing or shortness of breath. 1 Inhaler 2  . amLODipine (NORVASC) 5 MG tablet Take 1 tablet (5 mg total) by mouth daily. 30 tablet 3  . aspirin EC 81 MG tablet Take 81 mg by mouth daily.    . budesonide-formoterol (SYMBICORT) 80-4.5 MCG/ACT inhaler INHALE 2 PUFFS INTO LUNGS TWICE DAILY. 10.2 g 3  . busPIRone (BUSPAR) 15 MG tablet Take half a pill twice a day (Patient taking differently: Take 7.5 mg by mouth 2 (two) times daily. ) 30 tablet 2  . glipiZIDE (GLUCOTROL XL) 10 MG 24 hr tablet Take 1 tablet (10 mg total) by mouth daily. 30 tablet 4  . ipratropium-albuterol (DUONEB) 0.5-2.5 (3) MG/3ML SOLN Take 3 mLs by nebulization every 4 (four) hours as needed. (Patient taking differently: Take 3 mLs by  nebulization every 4 (four) hours as needed (for shortness of breath). ) 360 mL 1  . metFORMIN (GLUCOPHAGE) 1000 MG tablet TAKE 1 TABLET BY MOUTH TWICE DAILY WITH FOOD FOR DIABETES. 60 tablet 3  . nicotine (NICODERM CQ - DOSED IN MG/24 HOURS) 21 mg/24hr patch Place 1 patch (21 mg total) onto the skin daily. 28 patch 0  . omeprazole (PRILOSEC) 20 MG capsule 1 PO 30 mins prior to breakfast and supper 60 capsule 11  . polyethylene glycol powder (GLYCOLAX/MIRALAX) powder MIX 1 CAPFUL (17G) IN 8 OUNCES OF JUICE/WATER AND DRINK ONCE DAILY. 527 g 3  . pravastatin (PRAVACHOL) 40 MG tablet TAKE (1) TABLET BY MOUTH AT BEDTIME FOR CHOLESTEROL. 30 tablet 5  . risperiDONE (RISPERDAL) 1 MG tablet Take 1 tablet (1 mg total) by mouth at bedtime. Takes '5mg'$  total every day at bedtime 30 tablet 2  . risperidone (RISPERDAL) 4 MG tablet Take 1 tablet (4 mg total) by mouth at bedtime. 30 tablet 2  . SPIRIVA HANDIHALER 18 MCG inhalation capsule INHALE 1 CAPSULE AS DIRECTED ONCE A DAY. 30 capsule 4  . traZODone (DESYREL) 50 MG tablet One tablet at bedtime for sleep (Patient taking differently: Take 50 mg by mouth at bedtime as needed for sleep. ) 30 tablet 3  . triamterene-hydrochlorothiazide (MAXZIDE-25) 37.5-25 MG tablet Take 1 tablet by mouth daily. 30 tablet 5  . valsartan (DIOVAN) 320 MG tablet Take 1 tablet (320 mg total) by mouth daily. 30 tablet  5  . [DISCONTINUED] sildenafil (VIAGRA) 100 MG tablet Take 100 mg by mouth. Take one tablet 30 mins before intercourse      No current facility-administered medications for this visit.    Functional Status: In your present state of health, do you have any difficulty performing the following activities: 01/31/2016 01/23/2016  Hearing? N N  Vision? N N  Difficulty concentrating or making decisions? N N  Walking or climbing stairs? N N  Dressing or bathing? N N  Doing errands, shopping? N N  Preparing Food and eating ? Y -  Using the Toilet? N -  In the past six  months, have you accidently leaked urine? N -  Do you have problems with loss of bowel control? N -  Managing your Medications? Y -  Managing your Finances? N -  Housekeeping or managing your Housekeeping? N -    Fall/Depression Screening: PHQ 2/9 Scores 01/31/2016 04/01/2015 09/02/2013  PHQ - 2 Score '4 3 1  '$ PHQ- 9 Score '6 7 3    '$ Assessment: Drugs sorted by system:  Neurologic/Psychologic: buspirone, risperidone, trazodone  Cardiovascular: amlodipine, aspirin, pravastatin, triamterene-hydrochlorothiazide, valsartan  Pulmonary/Allergy: albuterol HFA PRN, budesonide-formoterol inhaler, ipratropium-albuterol nebulizer PRN, tiotropium inhaler  Gastrointestinal: omeprazole, polyethylene glycol  Endocrine: glipizide, metformin   Topical: nicotine patch  Pain: acetaminophen  Duplications in therapy: none noted Gaps in therapy: Based on the American Heart Association estimate 10-year risk of ASCVD calculation, patient has a calculated 10-year risk of 25.2%.  Patient is currently on a moderate-intensity statin.  Based on this risk estimate, recommend a high intensity statin for this patient.   Medications to avoid in the elderly: omeprazole (risk of Clostridium difficile infection and bone loss and fractures) Drug interactions: none noted  1.  Medication assistance:  Patient reports difficulty affording nicotine patches, ipratropium-albuterol nebulizers, and Accu-chek test strips.  I previously gave patient the number for the [facility] 1-800-QUITNOW assistance program.  Patient confirms he has already called them and they are going to send him step 2 of the nicotine patches.  I counseled patient on purpose, proper use, and adverse effects of nicotine patches and discussed recommended duration of use.    Patient has [employer] special need plan and [facility].  I made a call to [place] and spoke to Matthew Molina regarding patient's copay amount.  Matthew Molina reports patient has a  part B deductible and must pay $93.40 before deductible is met.  Patient's test strips have copay of $78.74 until deductible is met then copay reduces to 20% and ipratropium-albuterol nebulizer copay is $29.80 until deductible is met then copay reduces to 20%.   Per review of patient's insurance plan online, the [facility] special needs plan should not have a deductible.  I made a call to [facility] and spoke to [place].  Felicia confirms that both Accu-chek test strips and ipratropium-albuterol nebulizers should be covered by patient's insurance with zero copay.  Felicia reports [employer] medicare should be billed as patient's primary insurance and medicaid should be billed as secondary insurance and medicaid will cover the deductible amount.    I spoke to [place] at [employer] regarding this information and Matthew Molina reports she is unable to bill patient's [employer] then [employer] because they do not have a Soil scientist with [employer] for DME supplies.  Matthew Molina suggested patient transfer those two prescriptions to a pharmacy that is able to [place] for DME supplies.  Patient chose to have prescriptions transferred to [place].  I  assisted patient in transferring his prescriptions for Accu-chek test strips and ipratropium-albuterol nebulizers to [facility].  Patient has enough test strips and nebulizer solution vials for at least two weeks.    2.  Medication management: Patient's neighbor assist patient in managing his medications.  Matthew Molina fills a weekly pill box for patient and helps remind patient to take his medications.  Patient reports adherence but states he occasionally forgets to take his medications.  Discussed use of medication alarm clock to improve adherence with medications.  Patient was interested in the use of the medication alarm clock.  I provided patient with a medication alarm clock and helped patient set alarms for his medications.  We set custom voice alarms  that will prompt patient to take his medications.  I taught patient how to reset the time on the alarm clock when the time changes later this week.  Patient's neighbor is agreeable to continue to fill patient's pill boxes every week.     Plan: 1.  Based on patient's estimate 10-year risk for ASCVD, will contact patient's PCP to recommend use of high intensity statin, such as atorvastatin 40 or 80 mg daily rather than pravastatin.  I will also send a letter to patient's provider to consider changing omeprazole to a histamine H2 antagonist such as ranitidine.   2.  Patient to continue to take all medications as prescribed using pill box filled by his neighbor Matthew Molina.  Patient to use medication alarm clock to assist patient in reminding him to take his medications.  3.  I will make outreach call to patient on 03/01/16 to follow up on patient's Accu-chek and ipratropium-albuterol nebulizer prescriptions.    THN CM Care Plan Problem One        Most Recent Value   Care Plan Problem One  Medication adherence   Role Documenting the Problem One  Clinical Pharmacist   Care Plan for Problem One  Active   THN CM Short Term Goal #1 (0-30 days)  Patient will take all medications as prescirbed over the next 30 days per patient report.    THN CM Short Term Goal #1 Start Date  02/24/16   Interventions for Short Term Goal #1  Provided patient with medication alarm clock to assist patient in remembering to take his medications.      Elisabeth Most, Pharm.D. Pharmacy Resident [place] [phone]

## 2016-02-25 ENCOUNTER — Ambulatory Visit (INDEPENDENT_AMBULATORY_CARE_PROVIDER_SITE_OTHER): Payer: Commercial Managed Care - HMO | Admitting: Podiatry

## 2016-02-25 ENCOUNTER — Encounter: Payer: Self-pay | Admitting: Podiatry

## 2016-02-25 DIAGNOSIS — M79676 Pain in unspecified toe(s): Secondary | ICD-10-CM

## 2016-02-25 DIAGNOSIS — E119 Type 2 diabetes mellitus without complications: Secondary | ICD-10-CM

## 2016-02-25 DIAGNOSIS — Q828 Other specified congenital malformations of skin: Secondary | ICD-10-CM

## 2016-02-25 DIAGNOSIS — B351 Tinea unguium: Secondary | ICD-10-CM | POA: Diagnosis not present

## 2016-02-25 NOTE — Progress Notes (Signed)
Patient ID: Matthew Molina, male   DOB: [birth year], 65 y.o.   MRN: [id number] Complaint:  Visit Type: Patient returns to my office for  preventative foot care services. Complaint: Patient states" my nails have grown long and thick and become painful to walk and wear shoes" Patient has been diagnosed with DM with no foot complications. The patient presents for preventative foot care services. No changes to ROS He relates two painful callus under both feet.  Also has history of redness and swelling and stillness to right big toe which has marked;ly improved.  Podiatric Exam: Vascular: dorsalis pedis and posterior tibial pulses are palpable bilateral. Capillary return is immediate. Temperature gradient is WNL. Skin turgor WNL  Sensorium: Normal Semmes Weinstein monofilament test. Normal tactile sensation bilaterally. Nail Exam: Pt has thick disfigured discolored nails with subungual debris noted bilateral entire nail hallux through fifth toenails Ulcer Exam: There is no evidence of ulcer or pre-ulcerative changes or infection. Orthopedic Exam: Muscle tone and strength are WNL. No limitations in general ROM. No crepitus or effusions noted. Foot type and digits show no abnormalities. Bony prominences are unremarkable.Mild brownish discoloration 1st MPJ right foot. Skin:  Porokeratosis sub 5th metabase both feet.. No infection or ulcers  Diagnosis:  Onychomycosis, , Pain in right toe, pain in left toes, Porokeratosis  Treatment & Plan Procedures and Treatment: Consent by patient was obtained for treatment procedures. The patient understood the discussion of treatment and procedures well. All questions were answered thoroughly reviewed. Debridement of mycotic and hypertrophic toenails, 1 through 5 bilateral and clearing of subungual debris. No ulceration, no infection noted. Debride porokeratosis. Return Visit-Office Procedure: Patient instructed to return to the office for a follow up visit 3 months for  continued evaluation and treatment.    Gardiner Barefoot DPM

## 2016-02-26 ENCOUNTER — Encounter: Payer: Self-pay | Admitting: Pharmacist

## 2016-02-27 ENCOUNTER — Encounter: Payer: Self-pay | Admitting: Pharmacist

## 2016-02-28 ENCOUNTER — Other Ambulatory Visit: Payer: Self-pay | Admitting: *Deleted

## 2016-02-28 ENCOUNTER — Encounter: Payer: Self-pay | Admitting: *Deleted

## 2016-02-28 NOTE — Patient Outreach (Addendum)
[facility]) Care Management   02/28/2016  Matthew Molina [birth year] [id number]  Matthew Molina is an 65 y.o. male  Subjective:  "I am doing good"  Patient reports he has not smoked in 2 weeks and 2 days. He states his goal is not smoke anymore. Patient continues nicotine patch. Patient states he is getting oxygen later this week to use at night.  Patient states his test went well, but they did give him a medication to take 2 times a day, will follow up in June.  Patient reports that [place] visit went well and he appreciates the assistance. He is now using a medication alarm.  Neighbor still assisting with meals and medications.  Objective: BP 116/60 mmHg  Pulse 99  Resp 20  Ht 1.854 m ('6\' 1"'$ )  Wt 249 lb (112.946 kg)  BMI 32.86 kg/m2  SpO2 94%   Review of Systems  Constitutional: Negative.   HENT: Negative.   Eyes: Negative.   Respiratory: Positive for cough.   Cardiovascular: Negative.   Gastrointestinal: Negative.   Genitourinary: Negative.   Musculoskeletal: Negative.   Skin: Negative.   Neurological: Negative.   Endo/Heme/Allergies: Negative.   Psychiatric/Behavioral: The patient is nervous/anxious.     Physical Exam  Constitutional: He is oriented to person, place, and time. He appears well-developed and well-nourished.  Cardiovascular: Normal rate.   Respiratory: Breath sounds normal.  GI: Soft. Bowel sounds are normal.  Musculoskeletal: Normal range of motion.  Neurological: He is alert and oriented to person, place, and time.  Skin: Skin is warm and dry.    Current Medications:   Current Outpatient Prescriptions  Medication Sig Dispense Refill  . ACCU-CHEK AVIVA PLUS test strip TEST BLOOD SUGAR ONCE DAILY AS DIRECTED. 50 each 0  . acetaminophen (TYLENOL) 500 MG tablet Take 500 mg by mouth daily as needed.    Marland Kitchen albuterol (PROVENTIL HFA;VENTOLIN HFA) 108 (90 BASE) MCG/ACT inhaler Inhale 2 puffs into the lungs every 6 (six) hours  as needed for wheezing or shortness of breath. 1 Inhaler 2  . amLODipine (NORVASC) 5 MG tablet Take 1 tablet (5 mg total) by mouth daily. 30 tablet 3  . aspirin EC 81 MG tablet Take 81 mg by mouth daily.    . budesonide-formoterol (SYMBICORT) 80-4.5 MCG/ACT inhaler INHALE 2 PUFFS INTO LUNGS TWICE DAILY. 10.2 g 3  . busPIRone (BUSPAR) 15 MG tablet Take half a pill twice a day (Patient taking differently: Take 7.5 mg by mouth 2 (two) times daily. ) 30 tablet 2  . glipiZIDE (GLUCOTROL XL) 10 MG 24 hr tablet Take 1 tablet (10 mg total) by mouth daily. 30 tablet 4  . ipratropium-albuterol (DUONEB) 0.5-2.5 (3) MG/3ML SOLN Take 3 mLs by nebulization every 4 (four) hours as needed. (Patient taking differently: Take 3 mLs by nebulization every 4 (four) hours as needed (for shortness of breath). ) 360 mL 1  . metFORMIN (GLUCOPHAGE) 1000 MG tablet TAKE 1 TABLET BY MOUTH TWICE DAILY WITH FOOD FOR DIABETES. 60 tablet 3  . nicotine (NICODERM CQ - DOSED IN MG/24 HOURS) 21 mg/24hr patch Place 1 patch (21 mg total) onto the skin daily. 28 patch 0  . omeprazole (PRILOSEC) 20 MG capsule 1 PO 30 mins prior to breakfast and supper 60 capsule 11  . polyethylene glycol powder (GLYCOLAX/MIRALAX) powder MIX 1 CAPFUL (17G) IN 8 OUNCES OF JUICE/WATER AND DRINK ONCE DAILY. 527 g 3  . pravastatin (PRAVACHOL) 40 MG tablet TAKE (1) TABLET BY MOUTH  AT BEDTIME FOR CHOLESTEROL. 30 tablet 5  . risperiDONE (RISPERDAL) 1 MG tablet Take 1 tablet (1 mg total) by mouth at bedtime. Takes '5mg'$  total every day at bedtime 30 tablet 2  . risperidone (RISPERDAL) 4 MG tablet Take 1 tablet (4 mg total) by mouth at bedtime. 30 tablet 2  . SPIRIVA HANDIHALER 18 MCG inhalation capsule INHALE 1 CAPSULE AS DIRECTED ONCE A DAY. 30 capsule 4  . traZODone (DESYREL) 50 MG tablet One tablet at bedtime for sleep (Patient taking differently: Take 50 mg by mouth at bedtime as needed for sleep. ) 30 tablet 3  . triamterene-hydrochlorothiazide (MAXZIDE-25)  37.5-25 MG tablet Take 1 tablet by mouth daily. 30 tablet 5  . valsartan (DIOVAN) 320 MG tablet Take 1 tablet (320 mg total) by mouth daily. 30 tablet 5  . [DISCONTINUED] sildenafil (VIAGRA) 100 MG tablet Take 100 mg by mouth. Take one tablet 30 mins before intercourse      No current facility-administered medications for this visit.    Functional Status:   In your present state of health, do you have any difficulty performing the following activities: 02/28/2016 01/31/2016  Hearing? N N  Vision? N N  Difficulty concentrating or making decisions? N N  Walking or climbing stairs? Y N  Dressing or bathing? N N  Doing errands, shopping? Y N  Preparing Food and eating ? Y Y  Using the Toilet? N N  In the past six months, have you accidently leaked urine? N N  Do you have problems with loss of bowel control? N N  Managing your Medications? Y Y  Managing your Finances? N N  Housekeeping or managing your Housekeeping? N N    Fall/Depression Screening:   Fall Risk  02/28/2016 02/14/2016 01/31/2016 01/27/2015 09/02/2013  Falls in the past year? - No No No No  Risk for fall due to : Impaired balance/gait - History of fall(s) - -  Risk for fall due to (comments): - - 2 falls more than a year ago - -    PHQ 2/9 Scores 01/31/2016 04/01/2015 09/02/2013  PHQ - 2 Score '4 3 1  '$ PHQ- 9 Score '6 7 3    '$ Assessment:   COPD: No symptoms this visit, patient to start using oxygen at night. Reviewed COPD zones with patient and caregiver Diabetes: Gave Carb counting education sheet for reference Medication adherence: patient now using medication alarm  Plan:  [facility] CM Care Plan Problem One        Most Recent Value   Care Plan Problem One  COPD knowledge deficit    Role Documenting the Problem One  Care Management Coordinator   Care Plan for Problem One  Active   THN Long Term Goal (31-90 days)  Patient will be able to verbalize COPD exacerbation symptoms and seek earlier assistance    [facility] Long Term Goal  Start Date  01/31/16   Interventions for Problem One Long Term Goal  Encouraged patient to continue to not smoke. praised patient for adherence to using spiriva. Reviewed oxygen safety and importance of not smoking   THN CM Short Term Goal #1 (0-30 days)  Patient will be better adherent with COPD medications    THN CM Short Term Goal #1 Start Date  01/31/16   Interventions for Short Term Goal #1  Praised patient for not smoking and for using medication alarm to take medications on time, assisted with changing time on medication alarm.     [place]  Problem Two        Most Recent Value   Care Plan Problem Two  At risk for readmission as evidenced by 4 hospital admissions in 6 months   Role Documenting the Problem Two  Care Management [place] for Problem Two  Active   THN CM Short Term Goal #1 (0-30 days)  Patient will not have any COPD related hospitalizations in the next 30 days   THN CM Short Term Goal #1 Met Date   02/28/16   Interventions for Short Term Goal #2   Praised patient for not smoking and using COPD medications     Patient will call with any questions or concerns.  RNCM will visit again in April Jaelani Posa E. [name], RN, BSN, [place] [phone]

## 2016-03-01 ENCOUNTER — Other Ambulatory Visit: Payer: Self-pay | Admitting: Family Medicine

## 2016-03-01 ENCOUNTER — Other Ambulatory Visit: Payer: Self-pay | Admitting: Pharmacist

## 2016-03-01 NOTE — Patient Outreach (Signed)
Matthew Molina [facility]) Care Management  [place]   03/01/2016  Matthew Molina [birth year] [id number]  Matthew Molina is a 65yo who was referred to [place] for medication assistance and medication management.  I made a follow up call to patient.    Patient confirms he has started using the medication alarm clock and reports adherence with all medications.  Patient confirms that his neighbor Matthew Molina is continuing to help him fill his weekly pill box.    Patient reports he has not heard from [place] regarding Accu-chek test strips and ipratropium-albuterol nebulizers.  With patient's permission, I called [facility].  I spoke to Matthew Molina who states they have not received prescriptions for the test strips and nebulizer solution from [place] yet.  Matthew Molina reports she will resend request to  [place] to have prescriptions transferred to them.    I updated patient on this information.  Patient voices understanding and confirms that he has enough test strips and nebulizer solution for at least another week.    Plan:  I will follow up with patient in one week to check on status of test strips and nebulizer solution.     Matthew Molina, Pharm.D. Pharmacy Resident [place] [phone]

## 2016-03-01 NOTE — Patient Outreach (Signed)
 [facility]) Care Management  03/01/2016  Matthew Molina 03-22-1951 [id number]   Matthew Molina is a 65yo who was referred to [place] for medication assistance and management.  I received a return call from patient who reports that [place] called him regarding his Accu-chek test strips.  Patient reports [facility] has his test strips ready for him to pick up and the copay is $7.91.  Patient confirms he is able to pay copay for test strips.  Patient reports the pharmacy had to contact his provider regarding ipratropium-albuterol nebulizer solution so it is not ready to pick up yet.   Plan:  I will follow up with patient in one week to check on the status of patient's prescriptions for ipratropium-albuterol nebulizer solution.     Elisabeth Most, Pharm.D. Pharmacy Resident [place] [phone]

## 2016-03-08 ENCOUNTER — Ambulatory Visit (HOSPITAL_COMMUNITY): Payer: Self-pay | Admitting: Psychiatry

## 2016-03-08 ENCOUNTER — Other Ambulatory Visit: Payer: Self-pay | Admitting: Pharmacist

## 2016-03-08 NOTE — Patient Outreach (Signed)
[facility]) Care Management  [place]   03/08/2016  Matthew Molina July 06, 1951 [id number]  Subjective: Matthew Molina is a 65yo who was referred to [facility] for medication assistance and management.  I made a follow up phone call to patient to check on the status of his ipratropium-albuterol nebulizer prescription.  Patient reports he was able to pick up both his ipratropium-albuterol nebulizer solution and his Accu-chek test strips from [facility] with no copay.  Patient denies any further concerns regarding medication cost.    Patient confirms that he has been using the medication alarm clock and reports adherence with all medications.  Patient did request assistance with resetting the times on his alarm clock.  His alarm is currently set to go off at 8:00 AM, 3:30 PM, and 11:00 PM.  Patient requested to change alarm times to 9:30 AM, 5:30 PM, and 11:00 PM.    Patient reports he started smoking again.  I reminded patient of the 1-800-QUITNOW program for assistance with smoking cessation.  Patient confirms that he has made several calls to the quit line.  I advised patient to continue to use this resource for smoking cessation.    Objective:   Current Medications: Current Outpatient Prescriptions  Medication Sig Dispense Refill  . ACCU-CHEK AVIVA PLUS test strip TEST BLOOD SUGAR ONCE DAILY AS DIRECTED. 50 each 0  . acetaminophen (TYLENOL) 500 MG tablet Take 500 mg by mouth daily as needed.    Marland Kitchen albuterol (PROVENTIL HFA;VENTOLIN HFA) 108 (90 BASE) MCG/ACT inhaler Inhale 2 puffs into the lungs every 6 (six) hours as needed for wheezing or shortness of breath. 1 Inhaler 2  . amLODipine (NORVASC) 5 MG tablet Take 1 tablet (5 mg total) by mouth daily. 30 tablet 3  . aspirin EC 81 MG tablet Take 81 mg by mouth daily.    . budesonide-formoterol (SYMBICORT) 80-4.5 MCG/ACT inhaler INHALE 2 PUFFS INTO LUNGS TWICE DAILY. 10.2 g 3  . busPIRone (BUSPAR) 15 MG  tablet Take half a pill twice a day (Patient taking differently: Take 7.5 mg by mouth 2 (two) times daily. ) 30 tablet 2  . glipiZIDE (GLUCOTROL XL) 10 MG 24 hr tablet Take 1 tablet (10 mg total) by mouth daily. 30 tablet 4  . ipratropium-albuterol (DUONEB) 0.5-2.5 (3) MG/3ML SOLN INHALE 1 VIAL VIA NEBULIZER EVERY FOUR HOURS AS NEEDED. 360 mL 2  . metFORMIN (GLUCOPHAGE) 1000 MG tablet TAKE 1 TABLET BY MOUTH TWICE DAILY WITH FOOD FOR DIABETES. 60 tablet 3  . nicotine (NICODERM CQ - DOSED IN MG/24 HOURS) 21 mg/24hr patch Place 1 patch (21 mg total) onto the skin daily. 28 patch 0  . omeprazole (PRILOSEC) 20 MG capsule 1 PO 30 mins prior to breakfast and supper 60 capsule 11  . polyethylene glycol powder (GLYCOLAX/MIRALAX) powder MIX 1 CAPFUL (17G) IN 8 OUNCES OF JUICE/WATER AND DRINK ONCE DAILY. 527 g 3  . pravastatin (PRAVACHOL) 40 MG tablet TAKE (1) TABLET BY MOUTH AT BEDTIME FOR CHOLESTEROL. 30 tablet 5  . risperiDONE (RISPERDAL) 1 MG tablet Take 1 tablet (1 mg total) by mouth at bedtime. Takes '5mg'$  total every day at bedtime 30 tablet 2  . risperidone (RISPERDAL) 4 MG tablet Take 1 tablet (4 mg total) by mouth at bedtime. 30 tablet 2  . SPIRIVA HANDIHALER 18 MCG inhalation capsule INHALE 1 CAPSULE AS DIRECTED ONCE A DAY. 30 capsule 4  . traZODone (DESYREL) 50 MG tablet One tablet at bedtime for sleep (Patient  taking differently: Take 50 mg by mouth at bedtime as needed for sleep. ) 30 tablet 3  . triamterene-hydrochlorothiazide (MAXZIDE-25) 37.5-25 MG tablet Take 1 tablet by mouth daily. 30 tablet 5  . valsartan (DIOVAN) 320 MG tablet Take 1 tablet (320 mg total) by mouth daily. 30 tablet 5  . [DISCONTINUED] sildenafil (VIAGRA) 100 MG tablet Take 100 mg by mouth. Take one tablet 30 mins before intercourse      No current facility-administered medications for this visit.    Functional Status: In your present state of health, do you have any difficulty performing the following activities:  02/28/2016 01/31/2016  Hearing? N N  Vision? N N  Difficulty concentrating or making decisions? N N  Walking or climbing stairs? Y N  Dressing or bathing? N N  Doing errands, shopping? Y N  Preparing Food and eating ? Y Y  Using the Toilet? N N  In the past six months, have you accidently leaked urine? N N  Do you have problems with loss of bowel control? N N  Managing your Medications? Y Y  Managing your Finances? N N  Housekeeping or managing your Housekeeping? N N    Fall/Depression Screening: PHQ 2/9 Scores 02/28/2016 01/31/2016 04/01/2015 09/02/2013  PHQ - 2 Score 0 '4 3 1  '$ PHQ- 9 Score - '6 7 3    '$ Assessment: 1.  Medication assistance:  Patient reported difficulty affording his nicotine patches, ipratropium-albuterol nebulizer solution, and Accu-chek test strips.  I previously gave patient the number for the [facility] 1-800-QUITNOW assistance program and patient is able to obtain free nicotine patches through the program.  I have counseled patient on purpose, proper use, and adverse effects of nicotine patches.  I also assisted patient in transferring prescription for nebulizer solution and test strips to a pharmacy that is able to bill [employer] part B and medicaid.  Patient chose to have prescriptions transferred to [facility].  Today, patient confirmed he was able to pick up both nebulizer solution and test strips at [facility] with no copay.  Patient denies any further issues with medication cost.    2.  Medication management:  Patient's neighbor assists patient in managing his medications by filling a weekly pill box for patient.  I provided patient with a medication alarm clock to assist in reminding him to take his medications.  Patient reports improved adherence to his medications with the use of the medication alarm clock.  I assisted patient in resetting his medication alarms over the phone today.  Patient denies any other questions at this time.    Plan: 1.  Patient to  continue to take all medications as prescribed using pill box filled by his neighbor.  Patient to continue to use medication alarm clock to assist in reminding him to take his medications.   2.  Will close pharmacy program as goals have been met.  I have updated [facility].  I encouraged patient to call me if he has any questions or concerns regarding his medications in the future.    THN CM Care Plan Problem One        Most Recent Value   Care Plan Problem One  Medication adherence   Role Documenting the Problem One  Clinical Pharmacist   Care Plan for Problem One  Active   THN CM Short Term Goal #1 (0-30 days)  Patient will take all medications as prescirbed over the next 30 days per patient report.    THN  CM Short Term Goal #1 Start Date  02/24/16   [facility] CM Short Term Goal #1 Met Date  03/08/16   Interventions for Short Term Goal #1  Patient confirms he has been using medication alarm clock and reports adherence with all medications.  Patient requested assistance with re-setting times for his alarms (wanted to check morning alarm from 8 AM to 9:30 AM).  I walked through the steps to change the alarm time with patient over the phone and assisted patient in changing the alarm time.       Elisabeth Most, Pharm.D. Pharmacy Resident [place] [phone]

## 2016-03-09 ENCOUNTER — Telehealth: Payer: Self-pay | Admitting: Family Medicine

## 2016-03-09 ENCOUNTER — Encounter (HOSPITAL_COMMUNITY): Payer: Self-pay | Admitting: Psychiatry

## 2016-03-09 ENCOUNTER — Other Ambulatory Visit: Payer: Self-pay

## 2016-03-09 MED ORDER — PREDNISONE 5 MG (21) PO TBPK: 5.0000 mg | ORAL_TABLET | ORAL | Status: DC

## 2016-03-09 NOTE — Telephone Encounter (Signed)
Patient states that he has had productive cough x 24 hours.  No fever noted.  He is not currently on prednisone.  States that he is cutting back on smoking.  Please advise.   Pharmacy:  [facility]

## 2016-03-09 NOTE — Telephone Encounter (Signed)
Patient is calling asking if [name] would call him in something for a bad cough, please advise?

## 2016-03-09 NOTE — Telephone Encounter (Signed)
Dose pack sent in, advise him to call and come in next week if worsens

## 2016-03-09 NOTE — Telephone Encounter (Signed)
Patient aware.  He has an appointment in system for 3/29

## 2016-03-15 ENCOUNTER — Ambulatory Visit: Payer: Self-pay | Admitting: Family Medicine

## 2016-03-20 ENCOUNTER — Other Ambulatory Visit: Payer: Self-pay | Admitting: *Deleted

## 2016-03-20 ENCOUNTER — Encounter: Payer: Self-pay | Admitting: *Deleted

## 2016-03-20 VITALS — BP 120/70 | HR 106 | Resp 20

## 2016-03-20 DIAGNOSIS — F209 Schizophrenia, unspecified: Secondary | ICD-10-CM

## 2016-03-20 DIAGNOSIS — J441 Chronic obstructive pulmonary disease with (acute) exacerbation: Secondary | ICD-10-CM

## 2016-03-20 NOTE — Patient Outreach (Signed)
[facility]) Care Management   03/20/2016  Matthew Molina September 23, 1951 [id number]  Matthew Molina is an 65 y.o. male  Subjective:  "I have been back to smoking for about a week" "I don't know why I can't quit"  Patient reports no new issues or concerns this visit other than his smoking and a question about taking the prilosec 30 minutes before breakfast and supper.  Objective:   BP 120/70 mmHg  Pulse 106  Resp 20  SpO2 97%  Patient neatly groomed and dressed, home neat and clean. Smells of cigarettes. Neighbor Parke Simmers present for last of visit. Review of Systems  Constitutional: Negative.   HENT: Negative.   Eyes: Negative.   Respiratory: Negative.   Cardiovascular: Negative.   Gastrointestinal: Negative.   Genitourinary: Negative.   Musculoskeletal: Negative.   Skin: Negative.   Neurological: Negative.   Endo/Heme/Allergies: Negative.   Psychiatric/Behavioral: Negative.     Physical Exam  Constitutional: He is oriented to person, place, and time. He appears well-developed and well-nourished.  Cardiovascular: Normal rate and regular rhythm.   Respiratory: Effort normal and breath sounds normal.  GI: Soft. Bowel sounds are normal.  Neurological: He is alert and oriented to person, place, and time.  Skin: Skin is warm.  Psychiatric:  Flat affect    Current Medications:   Current Outpatient Prescriptions  Medication Sig Dispense Refill  . ACCU-CHEK AVIVA PLUS test strip TEST BLOOD SUGAR ONCE DAILY AS DIRECTED. 50 each 0  . acetaminophen (TYLENOL) 500 MG tablet Take 500 mg by mouth daily as needed.    Marland Kitchen albuterol (PROVENTIL HFA;VENTOLIN HFA) 108 (90 BASE) MCG/ACT inhaler Inhale 2 puffs into the lungs every 6 (six) hours as needed for wheezing or shortness of breath. 1 Inhaler 2  . amLODipine (NORVASC) 5 MG tablet Take 1 tablet (5 mg total) by mouth daily. 30 tablet 3  . aspirin EC 81 MG tablet Take 81 mg by mouth daily.    . budesonide-formoterol  (SYMBICORT) 80-4.5 MCG/ACT inhaler INHALE 2 PUFFS INTO LUNGS TWICE DAILY. 10.2 g 3  . busPIRone (BUSPAR) 15 MG tablet Take half a pill twice a day (Patient taking differently: Take 7.5 mg by mouth 2 (two) times daily. ) 30 tablet 2  . glipiZIDE (GLUCOTROL XL) 10 MG 24 hr tablet Take 1 tablet (10 mg total) by mouth daily. 30 tablet 4  . ipratropium-albuterol (DUONEB) 0.5-2.5 (3) MG/3ML SOLN INHALE 1 VIAL VIA NEBULIZER EVERY FOUR HOURS AS NEEDED. 360 mL 2  . metFORMIN (GLUCOPHAGE) 1000 MG tablet TAKE 1 TABLET BY MOUTH TWICE DAILY WITH FOOD FOR DIABETES. 60 tablet 3  . nicotine (NICODERM CQ - DOSED IN MG/24 HOURS) 21 mg/24hr patch Place 1 patch (21 mg total) onto the skin daily. 28 patch 0  . omeprazole (PRILOSEC) 20 MG capsule 1 PO 30 mins prior to breakfast and supper 60 capsule 11  . polyethylene glycol powder (GLYCOLAX/MIRALAX) powder MIX 1 CAPFUL (17G) IN 8 OUNCES OF JUICE/WATER AND DRINK ONCE DAILY. 527 g 3  . pravastatin (PRAVACHOL) 40 MG tablet TAKE (1) TABLET BY MOUTH AT BEDTIME FOR CHOLESTEROL. 30 tablet 5  . predniSONE (STERAPRED UNI-PAK 21 TAB) 5 MG (21) TBPK tablet Take 1 tablet (5 mg total) by mouth as directed. Use as directed 21 tablet 0  . risperiDONE (RISPERDAL) 1 MG tablet Take 1 tablet (1 mg total) by mouth at bedtime. Takes 5mg  total every day at bedtime 30 tablet 2  . risperidone (RISPERDAL) 4 MG tablet Take  1 tablet (4 mg total) by mouth at bedtime. 30 tablet 2  . SPIRIVA HANDIHALER 18 MCG inhalation capsule INHALE 1 CAPSULE AS DIRECTED ONCE A DAY. 30 capsule 4  . traZODone (DESYREL) 50 MG tablet One tablet at bedtime for sleep (Patient taking differently: Take 50 mg by mouth at bedtime as needed for sleep. ) 30 tablet 3  . triamterene-hydrochlorothiazide (MAXZIDE-25) 37.5-25 MG tablet Take 1 tablet by mouth daily. 30 tablet 5  . valsartan (DIOVAN) 320 MG tablet Take 1 tablet (320 mg total) by mouth daily. 30 tablet 5  . [DISCONTINUED] sildenafil (VIAGRA) 100 MG tablet Take 100 mg  by mouth. Take one tablet 30 mins before intercourse      No current facility-administered medications for this visit.    Functional Status:   In your present state of health, do you have any difficulty performing the following activities: 02/28/2016 01/31/2016  Hearing? N N  Vision? N N  Difficulty concentrating or making decisions? N N  Walking or climbing stairs? Y N  Dressing or bathing? N N  Doing errands, shopping? Y N  Preparing Food and eating ? Y Y  Using the Toilet? N N  In the past six months, have you accidently leaked urine? N N  Do you have problems with loss of bowel control? N N  Managing your Medications? Y Y  Managing your Finances? N N  Housekeeping or managing your Housekeeping? N N    Fall/Depression Screening:    Fall Risk  02/28/2016 02/14/2016 01/31/2016 01/27/2015 09/02/2013  Falls in the past year? - No No No No  Risk for fall due to : Impaired balance/gait - History of fall(s) - -  Risk for fall due to (comments): - - 2 falls more than a year ago - -   PHQ 2/9 Scores 02/28/2016 01/31/2016 04/01/2015 09/02/2013  PHQ - 2 Score 0 4 3 1   PHQ- 9 Score - 6 7 3     Assessment:   COPD: gave COPD zone magnet. Patient reports better adherence to medications but is back to smoking. Heart rate elevated today: instructed patient to record pulse with BP readings and to report to [name] at April 13th appointment. Medication adherence: Doing better with medication adherence, had a question about taking prilosec before meals.  Plan:  [facility] CM Care Plan Problem One        Most Recent Value   Care Plan Problem One  COPD knowledge deficit    Role Documenting the Problem One  Care Management Coordinator   Care Plan for Problem One  Active   THN Long Term Goal (31-90 days)  Patient will be able to verbalize COPD exacerbation symptoms and seek earlier assistance in the next 90 days    THN Long Term Goal Start Date  01/31/16   Interventions for Problem One Long Term Goal   Gave and reviewed COPD zones magnet, encouraged patient to consider stopping smoking again, Praised for using COPD meds    [facility] CM Care Plan Problem Two        Most Recent Value   Care Plan for Problem Two  Not Active     RNCM sent message to [facility] pharmacist about prilosec, will get back with patient. RNCM will send MD visit note and program closure letter. Plan to transfer patient to telephonic Health coach, patient verbalizes understanding of transfer and agrees. Patient has appointment with [name], will keep log of heart rates and report if they continue to be  over 100.  Royetta Crochet. [name], RN, BSN, [place] [phone]

## 2016-03-23 ENCOUNTER — Telehealth: Payer: Self-pay | Admitting: Family Medicine

## 2016-03-23 ENCOUNTER — Encounter: Payer: Self-pay | Admitting: *Deleted

## 2016-03-23 MED ORDER — RISPERIDONE 4 MG PO TABS: 4.0000 mg | ORAL_TABLET | Freq: Every day | ORAL | Status: DC

## 2016-03-23 MED ORDER — RISPERIDONE 1 MG PO TABS: 1.0000 mg | ORAL_TABLET | Freq: Every day | ORAL | Status: DC

## 2016-03-23 NOTE — Addendum Note (Signed)
Addended by: Burgess Amor E on: 03/23/2016 11:08 AM   Modules accepted: Orders

## 2016-03-23 NOTE — Telephone Encounter (Signed)
meds refilled 

## 2016-03-23 NOTE — Telephone Encounter (Signed)
Patient is asking for a refill on risperidone (RISPERDAL) 4 MG tablet AND risperiDONE (RISPERDAL) 1 MG tablet

## 2016-03-23 NOTE — Patient Outreach (Signed)
[facility]) Care Management  [name]  03/23/2016   Matthew Molina [birth year] [id number]  Call to patient for follow up and case transfer.   Call to patient to follow up on question regarding PPI as RNCM heard back from [facility] pharmacist. Reviewed with patient that he should attempt to take medication 30 minutes prior to meal as this is best practice and he will get the best result, but if he was forgetful to take as soon as he could, he would still get some benefit but best benefit of medication is 30 minutes prior.  Discussed with patient heart rate, he states he has not checked it but will check it and record and take with him to appointment with [name] next week.  Patient agrees with plan to transfer case to Health Coach.  No new needs for RNCM identified, patient has 24/7 nurse line number and understands when to call MD. Plan to close [facility] case and transfer to [place] coach program.  Royetta Crochet. [name], RN, BSN, [place] [phone]

## 2016-03-30 ENCOUNTER — Encounter: Payer: Self-pay | Admitting: Family Medicine

## 2016-03-30 ENCOUNTER — Telehealth: Payer: Self-pay | Admitting: Gastroenterology

## 2016-03-30 ENCOUNTER — Ambulatory Visit: Payer: Self-pay | Admitting: Family Medicine

## 2016-03-30 MED ORDER — CLARITHROMYCIN 500 MG PO TABS: ORAL_TABLET | ORAL | Status: DC

## 2016-03-30 MED ORDER — AMOXICILLIN 500 MG PO TABS: ORAL_TABLET | ORAL | Status: DC

## 2016-03-30 NOTE — Telephone Encounter (Signed)
PLEASE CALL PT. HIS stomach Bx showed H. Pylori infection. HE needs AMOXICILLIN 500 mg 2 po BID for 10 days and Biaxin 500 mg po bid for 10 days, #qs, rfx0. TAKE Omeprazole 20 mg BID for 10 days then 1 po dAILY forEVER. DO NOT TAKE PRAVACHOL WHILE TAKING THE ANTIBIOTICS. Med side effects include NVD, abd pain, and metallic taste.  DRINK WATER TO KEEP YOUR URINE LIGHT YELLOW. FOLLOW A LOW FAT/diabetic  DIET.  MEATS SHOULD BE BAKED, BROILED, OR BOILED. AVOID FRIED FOODS. SEE INFO BELOW ON A LOW FAT DIET.   CONTINUE OMEPRAZOLE.  TAKE 30 MINUTES PRIOR TO YOUR MEALS TWICE DAILY.  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 WE CAN TALK ABOUT REDUCING YOUR DOSE OF OMEPRAZOLE AFTER YOUR NEXT OUTPATIENT VISIT.

## 2016-03-30 NOTE — Telephone Encounter (Signed)
PT is aware and aware to hold Pravachol while on antibiotics.

## 2016-03-30 NOTE — Telephone Encounter (Signed)
Reminder in epic °

## 2016-04-05 ENCOUNTER — Other Ambulatory Visit: Payer: Self-pay

## 2016-04-05 NOTE — Patient Outreach (Signed)
[facility]) Care Management  04/05/2016  Matthew Molina [birth year] [id number]   Unsuccessful attempt to reach patient referred to disease management after completing TOC. HIPPA appropriate message left requesting call back.  If no response RN will attempt another call within 5 working days.  [name], RN, MSN [place] [phone] Fax [phone]

## 2016-04-11 ENCOUNTER — Other Ambulatory Visit: Payer: Self-pay

## 2016-04-11 DIAGNOSIS — J441 Chronic obstructive pulmonary disease with (acute) exacerbation: Secondary | ICD-10-CM

## 2016-04-11 NOTE — Patient Outreach (Signed)
[facility]) Care Management  [place]  04/11/2016   HOLMAN MARATEA [birth year] [id number]  Initial assessment for admission to disease management services for COPD self-management education.  Patient reports he can walk about "half a block" without shortness of breath, but that climbing stairs is very difficult.  He uses his O2 at night.  Patient reports he continues to smoke.  He has nicotine patches which he uses on some days when he is not smoking.  Encounter Medications:  Outpatient Encounter Prescriptions as of 04/11/2016  Medication Sig Note  . ACCU-CHEK AVIVA PLUS test strip TEST BLOOD SUGAR ONCE DAILY AS DIRECTED.   Marland Kitchen acetaminophen (TYLENOL) 500 MG tablet Take 500 mg by mouth daily as needed. 02/24/2016: Reports taking approximately once per week  . albuterol (PROVENTIL HFA;VENTOLIN HFA) 108 (90 BASE) MCG/ACT inhaler Inhale 2 puffs into the lungs every 6 (six) hours as needed for wheezing or shortness of breath. 02/24/2016: Reports using as needed. Reports using approximately once per week  . amLODipine (NORVASC) 5 MG tablet Take 1 tablet (5 mg total) by mouth daily.   Marland Kitchen amoxicillin (AMOXIL) 500 MG tablet 2 PO BID FOR 10 DAYS   . aspirin EC 81 MG tablet Take 81 mg by mouth daily.   . budesonide-formoterol (SYMBICORT) 80-4.5 MCG/ACT inhaler INHALE 2 PUFFS INTO LUNGS TWICE DAILY.   . busPIRone (BUSPAR) 15 MG tablet Take half a pill twice a day (Patient taking differently: Take 7.5 mg by mouth 2 (two) times daily. ) 11/29/2015: Taking half twice daily corrected update on 11/29/2015  . clarithromycin (BIAXIN) 500 MG tablet 1 PO BID FOR 10 DAYS.   Marland Kitchen glipiZIDE (GLUCOTROL XL) 10 MG 24 hr tablet Take 1 tablet (10 mg total) by mouth daily.   Marland Kitchen ipratropium-albuterol (DUONEB) 0.5-2.5 (3) MG/3ML SOLN INHALE 1 VIAL VIA NEBULIZER EVERY FOUR HOURS AS NEEDED.   . metFORMIN (GLUCOPHAGE) 1000 MG tablet TAKE 1 TABLET BY MOUTH TWICE DAILY WITH FOOD FOR DIABETES.   Marland Kitchen nicotine  (NICODERM CQ - DOSED IN MG/24 HOURS) 21 mg/24hr patch Place 1 patch (21 mg total) onto the skin daily.   Marland Kitchen omeprazole (PRILOSEC) 20 MG capsule 1 PO 30 mins prior to breakfast and supper   . polyethylene glycol powder (GLYCOLAX/MIRALAX) powder MIX 1 CAPFUL (17G) IN 8 OUNCES OF JUICE/WATER AND DRINK ONCE DAILY. 01/31/2016: Uses daily  . pravastatin (PRAVACHOL) 40 MG tablet TAKE (1) TABLET BY MOUTH AT BEDTIME FOR CHOLESTEROL.   Marland Kitchen predniSONE (STERAPRED UNI-PAK 21 TAB) 5 MG (21) TBPK tablet Take 1 tablet (5 mg total) by mouth as directed. Use as directed   . risperiDONE (RISPERDAL) 1 MG tablet Take 1 tablet (1 mg total) by mouth at bedtime. Takes 5mg  total every day at bedtime   . risperidone (RISPERDAL) 4 MG tablet Take 1 tablet (4 mg total) by mouth at bedtime.   Marland Kitchen SPIRIVA HANDIHALER 18 MCG inhalation capsule INHALE 1 CAPSULE AS DIRECTED ONCE A DAY.   . traZODone (DESYREL) 50 MG tablet One tablet at bedtime for sleep (Patient taking differently: Take 50 mg by mouth at bedtime as needed for sleep. )   . triamterene-hydrochlorothiazide (MAXZIDE-25) 37.5-25 MG tablet Take 1 tablet by mouth daily.   . valsartan (DIOVAN) 320 MG tablet Take 1 tablet (320 mg total) by mouth daily.    No facility-administered encounter medications on file as of 04/11/2016.    Functional Status:  In your present state of health, do you have any difficulty performing the  following activities: 04/11/2016 02/28/2016  Hearing? N N  Vision? N N  Difficulty concentrating or making decisions? N N  Walking or climbing stairs? Y Y  Dressing or bathing? Y N  Doing errands, shopping? [name]  Preparing Food and eating ? Y Y  Using the Toilet? N N  In the past six months, have you accidently leaked urine? N N  Do you have problems with loss of bowel control? N N  Managing your Medications? Y Y  Managing your Finances? N N  Housekeeping or managing your Housekeeping? Y N    Fall/Depression Screening: PHQ 2/9 Scores 04/11/2016  02/28/2016 01/31/2016 04/01/2015 09/02/2013  PHQ - 2 Score 1 0 4 3 1   PHQ- 9 Score - - 6 7 3    THN CM Care Plan Problem One        Most Recent Value   Care Plan Problem One  Knowledge deficit regarding COPD self-management.   Role Documenting the Problem One  [place] for Problem One  Active   THN Long Term Goal (31-90 days)  Patient will be able to discuss 3 self management strategies he uses for COPD within 90 days.   THN Long Term Goal Start Date  04/11/16   Interventions for Problem One Long Term Goal  Reviewed COPD Action Plan "Yellow Zone" symptoms.   THN CM Short Term Goal #1 (0-30 days)  Patient will report  decreased cigarrettes smoked to less than 1 pack a day. [within 30 days.]   THN CM Short Term Goal #1 Start Date  04/11/16   Interventions for Short Term Goal #1  Discussed methods used to stop smoking.  Encouraged regular [use of Nicotine patches.]      Plan: Patient will attempt to increase the number of days he goes without smoking and/or the number of cigarettes he smokes per day.           Patient will refer to the COPD Action Plan to help him monitor for respiratory changes.           RN will follow up in approximately one month.

## 2016-04-26 ENCOUNTER — Encounter: Payer: Self-pay | Admitting: Gastroenterology

## 2016-05-04 ENCOUNTER — Ambulatory Visit (INDEPENDENT_AMBULATORY_CARE_PROVIDER_SITE_OTHER): Payer: Commercial Managed Care - HMO | Admitting: Family Medicine

## 2016-05-04 ENCOUNTER — Encounter: Payer: Self-pay | Admitting: Family Medicine

## 2016-05-04 VITALS — BP 118/74 | HR 92 | Resp 16 | Ht 73.0 in | Wt 248.0 lb

## 2016-05-04 DIAGNOSIS — E1169 Type 2 diabetes mellitus with other specified complication: Secondary | ICD-10-CM

## 2016-05-04 DIAGNOSIS — I10 Essential (primary) hypertension: Secondary | ICD-10-CM | POA: Diagnosis not present

## 2016-05-04 DIAGNOSIS — Z0000 Encounter for general adult medical examination without abnormal findings: Secondary | ICD-10-CM

## 2016-05-04 DIAGNOSIS — E119 Type 2 diabetes mellitus without complications: Secondary | ICD-10-CM

## 2016-05-04 DIAGNOSIS — E785 Hyperlipidemia, unspecified: Secondary | ICD-10-CM | POA: Diagnosis not present

## 2016-05-04 DIAGNOSIS — F411 Generalized anxiety disorder: Secondary | ICD-10-CM | POA: Diagnosis not present

## 2016-05-04 DIAGNOSIS — Z23 Encounter for immunization: Secondary | ICD-10-CM

## 2016-05-04 DIAGNOSIS — Z125 Encounter for screening for malignant neoplasm of prostate: Secondary | ICD-10-CM

## 2016-05-04 DIAGNOSIS — M549 Dorsalgia, unspecified: Secondary | ICD-10-CM

## 2016-05-04 DIAGNOSIS — Z720 Tobacco use: Secondary | ICD-10-CM

## 2016-05-04 DIAGNOSIS — E669 Obesity, unspecified: Secondary | ICD-10-CM

## 2016-05-04 MED ORDER — BUSPIRONE HCL 7.5 MG PO TABS: ORAL_TABLET | ORAL | Status: DC

## 2016-05-04 NOTE — Progress Notes (Signed)
Subjective:    Patient ID: Matthew Molina, male    DOB: 24-Jan-1951, 65 y.o.   MRN: [id number]  HPI Preventive Screening-Counseling & Management   Patient present here today for a Medicare annual wellness visit.   Current Problems (verified)   Medications Prior to Visit Allergies (verified)   PAST HISTORY  Family History (verified)   Social History Divorced with 2 daughters, disabled, current smoker    Risk Factors  Current exercise habits:  Doesn't exercise much. Tries to walk daily and will try to do chair exercises   Dietary issues discussed: heart healthy, limit carbs and fried foods and limit portions   Cardiac risk factors: sister heart disease in her 61's , pt has diabetes, HTN, hyperlipidemia is obese and smokes   Depression Screen  (Note: if answer to either of the following is "Yes", a more complete depression screening is indicated)   Over the past two weeks, have you felt down, depressed or hopeless? No  Over the past two weeks, have you felt little interest or pleasure in doing things? No  Have you lost interest or pleasure in daily life? No  Do you often feel hopeless? No  Do you cry easily over simple problems? No   Activities of Daily Living  In your present state of health, do you have any difficulty performing the following activities?  Driving?: no Managing money?: No Feeding yourself?:No Getting from bed to chair?:No Climbing a flight of stairs?: avoids stairs  Preparing food and eating?:Neighbor prepares a lot of his food  Bathing or showering?:Needs a shower chair  Getting dressed?:No Getting to the toilet?:No Using the toilet?:No Moving around from place to place?: uses cane and walker   Fall Risk Assessment In the past year have you fallen or had a near fall?:No Are you currently taking any medications that make you dizzy?:No, buspar 15 mg causes this so he breaks tab in half   Hearing Difficulties: No Do you often ask people to speak  up or repeat themselves?:No Do you experience ringing or noises in your ears?:No Do you have difficulty understanding soft or whispered voices?:No  Cognitive Testing  Alert? Yes Normal Appearance?Yes  Oriented to person? Yes Place? Yes  Time? Yes  Displays appropriate judgment?Yes  Can read the correct time from a watch face? yes Are you having problems remembering things?No  Advanced Directives have been discussed with the patient?Yes, no living will  , full code   List the Names of Other Physician/Practitioners you currently use:  [facility] (pulmonary)   Indicate any recent Medical Services you may have received from other than [facility] providers in the past year (date may be approximate).   Assessment:    Annual Wellness Exam   Plan:     Medicare Attestation  I have personally reviewed:  The patient's medical and social history  Their use of alcohol, tobacco or illicit drugs  Their current medications and supplements  The patient's functional ability including ADLs,fall risks, home safety risks, cognitive, and hearing and visual impairment  Diet and physical activities  Evidence for depression or mood disorders  The patient's weight, height, BMI, and visual acuity have been recorded in the chart. I have made referrals, counseling, and provided education to the patient based on review of the above and I have provided the patient with a written personalized care plan for preventive services.      Review of Systems     Objective:   Physical Exam  BP 118/74  mmHg  Pulse 92  Resp 16  Ht 6\' 1"  (1.854 m)  Wt 248 lb (112.492 kg)  BMI 32.73 kg/m2  SpO2 97%  MS: Decreased ROM spine, hips and knees Abnormal gait      Assessment & Plan:  Medicare annual wellness visit, subsequent Annual exam as documented. Counseling done  re healthy lifestyle involving commitment to 150 minutes exercise per week, heart healthy diet, and attaining healthy weight.The importance of  adequate sleep also discussed. Regular seat belt use and home safety, is also discussed. Changes in health habits are decided on by the patient with goals and time frames  set for achieving them. Immunization and cancer screening needs are specifically addressed at this visit.   Back pain with radiation Decreased mobility, script for shower chair and toilet extender provided  Tobacco abuse Patient counseled for approximately 5 minutes regarding the health risks of ongoing nicotine use, specifically all types of cancer, heart disease, stroke and respiratory failure. The options available for help with cessation ,the behavioral changes to assist the process, and the option to either gradully reduce usage  Or abruptly stop.is also discussed. Pt is also encouraged to set specific goals in number of cigarettes used daily, as well as to set a quit date.  Number of cigarettes/cigars currently smoking daily: 18   GAD (generalized anxiety disorder) Reports adequate control with less light headedness on half prescribed dose of buspar, script adjusted to lowest effective and safe dose

## 2016-05-04 NOTE — Patient Instructions (Addendum)
Annual exam in 4.5 month, call if you need me sooner  Continue to work on stopping smoking  Remember to use spiriva every day  Buspar is changed to lower dose  Start walking for 10 minutes two times every day  Pneumonia 23 today  Labs non fast on 5/20 or next week, PSA, TSH. cmp and EGFR and HBA1C  Referred for eye exam and will request September appt per your preference  Thank you  for choosing [facility]. We consider it a privelige to serve you.  Delivering excellent health care in a caring and  compassionate way is our goal.  Partnering with you,  so that together we can achieve this goal is our strategy.

## 2016-05-05 ENCOUNTER — Other Ambulatory Visit: Payer: Self-pay | Admitting: Family Medicine

## 2016-05-06 NOTE — Assessment & Plan Note (Signed)

## 2016-05-06 NOTE — Assessment & Plan Note (Signed)
Decreased mobility, script for shower chair and toilet extender provided

## 2016-05-06 NOTE — Assessment & Plan Note (Signed)

## 2016-05-06 NOTE — Assessment & Plan Note (Signed)
Reports adequate control with less light headedness on half prescribed dose of buspar, script adjusted to lowest effective and safe dose

## 2016-05-09 ENCOUNTER — Other Ambulatory Visit: Payer: Self-pay

## 2016-05-09 VITALS — Wt 248.0 lb

## 2016-05-09 DIAGNOSIS — J441 Chronic obstructive pulmonary disease with (acute) exacerbation: Secondary | ICD-10-CM

## 2016-05-09 NOTE — Patient Outreach (Signed)
Matthew Molina [facility]) Care Management  05/09/2016  Matthew Molina [birth year] [id number]  Telephone contact today.  Patient reports he saw [name] on 05-04-16 for a physical.  Patient reports he continues to smoke about a pack of cigarettes per day.  Counseled on gradually reducing the number of cigarettes he smokes per day, with a goal of getting down to 1/2 pack within a month.  Patient reports no current changes in his COPD.  Plan:  Patient will continue efforts to decrease smoking.           RN will follow up in approximately one month.  [name], RN, MSN [place] [phone] Fax [phone]

## 2016-05-18 ENCOUNTER — Other Ambulatory Visit: Payer: Self-pay | Admitting: Family Medicine

## 2016-05-22 ENCOUNTER — Inpatient Hospital Stay (HOSPITAL_COMMUNITY)
Admission: EM | Admit: 2016-05-22 | Discharge: 2016-05-23 | DRG: 190 | Disposition: A | Discharge status: Home / Self Care | Payer: Commercial Managed Care - HMO | Attending: Internal Medicine | Admitting: Internal Medicine

## 2016-05-22 ENCOUNTER — Emergency Department (HOSPITAL_COMMUNITY): Payer: Commercial Managed Care - HMO

## 2016-05-22 ENCOUNTER — Encounter (HOSPITAL_COMMUNITY): Payer: Self-pay | Admitting: *Deleted

## 2016-05-22 DIAGNOSIS — N1832 Chronic kidney disease, stage 3b: Secondary | ICD-10-CM

## 2016-05-22 DIAGNOSIS — F209 Schizophrenia, unspecified: Secondary | ICD-10-CM | POA: Diagnosis present

## 2016-05-22 DIAGNOSIS — N183 Chronic kidney disease, stage 3 unspecified: Secondary | ICD-10-CM

## 2016-05-22 DIAGNOSIS — F17210 Nicotine dependence, cigarettes, uncomplicated: Secondary | ICD-10-CM | POA: Diagnosis present

## 2016-05-22 DIAGNOSIS — Z7984 Long term (current) use of oral hypoglycemic drugs: Secondary | ICD-10-CM | POA: Diagnosis not present

## 2016-05-22 DIAGNOSIS — Z823 Family history of stroke: Secondary | ICD-10-CM

## 2016-05-22 DIAGNOSIS — E7800 Pure hypercholesterolemia, unspecified: Secondary | ICD-10-CM | POA: Diagnosis present

## 2016-05-22 DIAGNOSIS — Z7982 Long term (current) use of aspirin: Secondary | ICD-10-CM

## 2016-05-22 DIAGNOSIS — Z6831 Body mass index (BMI) 31.0-31.9, adult: Secondary | ICD-10-CM

## 2016-05-22 DIAGNOSIS — Z841 Family history of disorders of kidney and ureter: Secondary | ICD-10-CM

## 2016-05-22 DIAGNOSIS — E785 Hyperlipidemia, unspecified: Secondary | ICD-10-CM | POA: Diagnosis present

## 2016-05-22 DIAGNOSIS — Z8249 Family history of ischemic heart disease and other diseases of the circulatory system: Secondary | ICD-10-CM | POA: Diagnosis not present

## 2016-05-22 DIAGNOSIS — E1122 Type 2 diabetes mellitus with diabetic chronic kidney disease: Secondary | ICD-10-CM | POA: Diagnosis present

## 2016-05-22 DIAGNOSIS — J441 Chronic obstructive pulmonary disease with (acute) exacerbation: Principal | ICD-10-CM | POA: Diagnosis present

## 2016-05-22 DIAGNOSIS — E1169 Type 2 diabetes mellitus with other specified complication: Secondary | ICD-10-CM

## 2016-05-22 DIAGNOSIS — Z833 Family history of diabetes mellitus: Secondary | ICD-10-CM

## 2016-05-22 DIAGNOSIS — I129 Hypertensive chronic kidney disease with stage 1 through stage 4 chronic kidney disease, or unspecified chronic kidney disease: Secondary | ICD-10-CM | POA: Diagnosis present

## 2016-05-22 DIAGNOSIS — J9601 Acute respiratory failure with hypoxia: Secondary | ICD-10-CM | POA: Diagnosis present

## 2016-05-22 DIAGNOSIS — Z7951 Long term (current) use of inhaled steroids: Secondary | ICD-10-CM

## 2016-05-22 DIAGNOSIS — E871 Hypo-osmolality and hyponatremia: Secondary | ICD-10-CM | POA: Diagnosis present

## 2016-05-22 DIAGNOSIS — E119 Type 2 diabetes mellitus without complications: Secondary | ICD-10-CM

## 2016-05-22 DIAGNOSIS — E669 Obesity, unspecified: Secondary | ICD-10-CM | POA: Diagnosis present

## 2016-05-22 DIAGNOSIS — I10 Essential (primary) hypertension: Secondary | ICD-10-CM | POA: Diagnosis not present

## 2016-05-22 DIAGNOSIS — Z79899 Other long term (current) drug therapy: Secondary | ICD-10-CM

## 2016-05-22 DIAGNOSIS — R0602 Shortness of breath: Secondary | ICD-10-CM | POA: Diagnosis present

## 2016-05-22 LAB — CBC WITH DIFFERENTIAL/PLATELET
Basophils Absolute: 0 10*3/uL (ref 0.0–0.1)
Basophils Relative: 1 %
Eosinophils Absolute: 0.9 10*3/uL — ABNORMAL HIGH (ref 0.0–0.7)
Eosinophils Relative: 12 %
HCT: 31.2 % — ABNORMAL LOW (ref 39.0–52.0)
Hemoglobin: 10.2 g/dL — ABNORMAL LOW (ref 13.0–17.0)
Lymphocytes Relative: 36 %
Lymphs Abs: 2.8 10*3/uL (ref 0.7–4.0)
MCH: 28.3 pg (ref 26.0–34.0)
MCHC: 32.7 g/dL (ref 30.0–36.0)
MCV: 86.7 fL (ref 78.0–100.0)
Monocytes Absolute: 0.6 10*3/uL (ref 0.1–1.0)
Monocytes Relative: 8 %
Neutro Abs: 3.3 10*3/uL (ref 1.7–7.7)
Neutrophils Relative %: 43 %
Platelets: 252 10*3/uL (ref 150–400)
RBC: 3.6 MIL/uL — ABNORMAL LOW (ref 4.22–5.81)
RDW: 14.5 % (ref 11.5–15.5)
WBC: 7.7 10*3/uL (ref 4.0–10.5)

## 2016-05-22 LAB — BASIC METABOLIC PANEL
Anion gap: 9 (ref 5–15)
BUN: 10 mg/dL (ref 6–20)
CO2: 21 mmol/L — ABNORMAL LOW (ref 22–32)
Calcium: 9.6 mg/dL (ref 8.9–10.3)
Chloride: 95 mmol/L — ABNORMAL LOW (ref 101–111)
Creatinine, Ser: 1.38 mg/dL — ABNORMAL HIGH (ref 0.61–1.24)
GFR calc Af Amer: 60 mL/min — ABNORMAL LOW (ref >60)
GFR calc non Af Amer: 52 mL/min — ABNORMAL LOW (ref >60)
Glucose, Bld: 118 mg/dL — ABNORMAL HIGH (ref 65–99)
Potassium: 4.1 mmol/L (ref 3.5–5.1)
Sodium: 125 mmol/L — ABNORMAL LOW (ref 135–145)

## 2016-05-22 LAB — GLUCOSE, CAPILLARY
Glucose-Capillary: 201 mg/dL — ABNORMAL HIGH (ref 65–99)
Glucose-Capillary: 231 mg/dL — ABNORMAL HIGH (ref 65–99)

## 2016-05-22 MED ORDER — PANTOPRAZOLE SODIUM 40 MG PO TBEC
40.0000 mg | DELAYED_RELEASE_TABLET | Freq: Every day | ORAL | Status: DC
Start: 2016-05-22 — End: 2016-05-23
  Administered 2016-05-22 – 2016-05-23 (×2): 40 mg via ORAL
  Filled 2016-05-22 (×2): qty 1

## 2016-05-22 MED ORDER — ACETAMINOPHEN 650 MG RE SUPP: 650.0000 mg | Freq: Four times a day (QID) | RECTAL | Status: DC | PRN

## 2016-05-22 MED ORDER — ACETAMINOPHEN 325 MG PO TABS: 650.0000 mg | ORAL_TABLET | Freq: Four times a day (QID) | ORAL | Status: DC | PRN

## 2016-05-22 MED ORDER — ASPIRIN EC 81 MG PO TBEC
81.0000 mg | DELAYED_RELEASE_TABLET | Freq: Every day | ORAL | Status: DC
  Administered 2016-05-22 – 2016-05-23 (×2): 81 mg via ORAL
  Filled 2016-05-22 (×2): qty 1

## 2016-05-22 MED ORDER — RISPERIDONE 1 MG PO TABS
5.0000 mg | ORAL_TABLET | Freq: Every day | ORAL | Status: DC
  Administered 2016-05-22: 5 mg via ORAL
  Filled 2016-05-22: qty 5

## 2016-05-22 MED ORDER — BENZONATATE 100 MG PO CAPS
200.0000 mg | ORAL_CAPSULE | Freq: Once | ORAL | Status: AC
  Administered 2016-05-22: 200 mg via ORAL
  Filled 2016-05-22: qty 2

## 2016-05-22 MED ORDER — ALBUTEROL SULFATE (2.5 MG/3ML) 0.083% IN NEBU
5.0000 mg | INHALATION_SOLUTION | Freq: Once | RESPIRATORY_TRACT | Status: AC
  Administered 2016-05-22: 5 mg via RESPIRATORY_TRACT
  Filled 2016-05-22: qty 6

## 2016-05-22 MED ORDER — RISPERIDONE 1 MG PO TABS: 1.0000 mg | ORAL_TABLET | Freq: Every day | ORAL | Status: DC

## 2016-05-22 MED ORDER — ONDANSETRON HCL 4 MG PO TABS: 4.0000 mg | ORAL_TABLET | Freq: Four times a day (QID) | ORAL | Status: DC | PRN

## 2016-05-22 MED ORDER — INSULIN ASPART 100 UNIT/ML ~~LOC~~ SOLN
4.0000 [IU] | Freq: Three times a day (TID) | SUBCUTANEOUS | Status: DC
  Administered 2016-05-22 – 2016-05-23 (×3): 4 [IU] via SUBCUTANEOUS

## 2016-05-22 MED ORDER — TRIAMTERENE-HCTZ 37.5-25 MG PO TABS
1.0000 | ORAL_TABLET | Freq: Every day | ORAL | Status: DC
  Administered 2016-05-22 – 2016-05-23 (×2): 1 via ORAL
  Filled 2016-05-22 (×2): qty 1

## 2016-05-22 MED ORDER — PRAVASTATIN SODIUM 40 MG PO TABS
40.0000 mg | ORAL_TABLET | Freq: Every day | ORAL | Status: DC
  Administered 2016-05-22: 40 mg via ORAL
  Filled 2016-05-22: qty 1

## 2016-05-22 MED ORDER — SODIUM CHLORIDE 0.9 % IV SOLN: 250.0000 mL | INTRAVENOUS | Status: DC | PRN

## 2016-05-22 MED ORDER — ENOXAPARIN SODIUM 60 MG/0.6ML ~~LOC~~ SOLN
50.0000 mg | SUBCUTANEOUS | Status: DC
  Administered 2016-05-22: 50 mg via SUBCUTANEOUS
  Filled 2016-05-22: qty 0.6

## 2016-05-22 MED ORDER — BUSPIRONE HCL 5 MG PO TABS
7.5000 mg | ORAL_TABLET | Freq: Two times a day (BID) | ORAL | Status: DC
  Administered 2016-05-22 – 2016-05-23 (×2): 7.5 mg via ORAL
  Filled 2016-05-22 (×2): qty 2

## 2016-05-22 MED ORDER — AMLODIPINE BESYLATE 5 MG PO TABS
5.0000 mg | ORAL_TABLET | Freq: Every day | ORAL | Status: DC
  Administered 2016-05-22 – 2016-05-23 (×2): 5 mg via ORAL
  Filled 2016-05-22 (×2): qty 1

## 2016-05-22 MED ORDER — TRAZODONE HCL 50 MG PO TABS: 50.0000 mg | ORAL_TABLET | Freq: Every evening | ORAL | Status: DC | PRN

## 2016-05-22 MED ORDER — AZITHROMYCIN 250 MG PO TABS
500.0000 mg | ORAL_TABLET | Freq: Every day | ORAL | Status: DC
  Administered 2016-05-22 – 2016-05-23 (×2): 500 mg via ORAL
  Filled 2016-05-22 (×2): qty 2

## 2016-05-22 MED ORDER — TIOTROPIUM BROMIDE MONOHYDRATE 18 MCG IN CAPS
18.0000 ug | ORAL_CAPSULE | Freq: Every day | RESPIRATORY_TRACT | Status: DC
  Administered 2016-05-23: 18 ug via RESPIRATORY_TRACT
  Filled 2016-05-22: qty 5

## 2016-05-22 MED ORDER — IPRATROPIUM BROMIDE 0.02 % IN SOLN
RESPIRATORY_TRACT | Status: AC
  Administered 2016-05-22: 0.5 mg
  Filled 2016-05-22: qty 2.5

## 2016-05-22 MED ORDER — INSULIN ASPART 100 UNIT/ML ~~LOC~~ SOLN
0.0000 [IU] | Freq: Three times a day (TID) | SUBCUTANEOUS | Status: DC
  Administered 2016-05-22: 3 [IU] via SUBCUTANEOUS
  Administered 2016-05-23: 8 [IU] via SUBCUTANEOUS
  Administered 2016-05-23: 11 [IU] via SUBCUTANEOUS

## 2016-05-22 MED ORDER — INSULIN ASPART 100 UNIT/ML ~~LOC~~ SOLN
0.0000 [IU] | Freq: Every day | SUBCUTANEOUS | Status: DC
  Administered 2016-05-22: 2 [IU] via SUBCUTANEOUS

## 2016-05-22 MED ORDER — BENZONATATE 100 MG PO CAPS
200.0000 mg | ORAL_CAPSULE | Freq: Three times a day (TID) | ORAL | Status: DC | PRN
Start: 2016-05-22 — End: 2016-05-23
  Administered 2016-05-22 – 2016-05-23 (×2): 200 mg via ORAL
  Filled 2016-05-22 (×2): qty 2

## 2016-05-22 MED ORDER — SODIUM CHLORIDE 0.9% FLUSH
3.0000 mL | Freq: Two times a day (BID) | INTRAVENOUS | Status: DC
  Administered 2016-05-22 – 2016-05-23 (×3): 3 mL via INTRAVENOUS

## 2016-05-22 MED ORDER — SODIUM CHLORIDE 0.9% FLUSH: 3.0000 mL | INTRAVENOUS | Status: DC | PRN

## 2016-05-22 MED ORDER — ONDANSETRON HCL 4 MG/2ML IJ SOLN: 4.0000 mg | Freq: Four times a day (QID) | INTRAMUSCULAR | Status: DC | PRN

## 2016-05-22 MED ORDER — ALBUTEROL (5 MG/ML) CONTINUOUS INHALATION SOLN
INHALATION_SOLUTION | RESPIRATORY_TRACT | Status: AC
  Administered 2016-05-22: 06:00:00
  Filled 2016-05-22: qty 20

## 2016-05-22 MED ORDER — ENSURE ENLIVE PO LIQD
237.0000 mL | Freq: Two times a day (BID) | ORAL | Status: DC
  Administered 2016-05-23: 237 mL via ORAL

## 2016-05-22 MED ORDER — METHYLPREDNISOLONE SODIUM SUCC 125 MG IJ SOLR
125.0000 mg | Freq: Once | INTRAMUSCULAR | Status: AC
  Administered 2016-05-22: 125 mg via INTRAVENOUS
  Filled 2016-05-22: qty 2

## 2016-05-22 MED ORDER — METHYLPREDNISOLONE SODIUM SUCC 125 MG IJ SOLR
60.0000 mg | Freq: Four times a day (QID) | INTRAMUSCULAR | Status: DC
  Administered 2016-05-22 – 2016-05-23 (×4): 60 mg via INTRAVENOUS
  Filled 2016-05-22 (×4): qty 2

## 2016-05-22 MED ORDER — MOMETASONE FURO-FORMOTEROL FUM 100-5 MCG/ACT IN AERO
2.0000 | INHALATION_SPRAY | Freq: Two times a day (BID) | RESPIRATORY_TRACT | Status: DC
  Administered 2016-05-22 – 2016-05-23 (×2): 2 via RESPIRATORY_TRACT
  Filled 2016-05-22: qty 8.8

## 2016-05-22 MED ORDER — IRBESARTAN 75 MG PO TABS
37.5000 mg | ORAL_TABLET | Freq: Every day | ORAL | Status: DC
  Administered 2016-05-22 – 2016-05-23 (×2): 37.5 mg via ORAL
  Filled 2016-05-22 (×2): qty 1

## 2016-05-22 MED ORDER — SENNOSIDES-DOCUSATE SODIUM 8.6-50 MG PO TABS: 1.0000 | ORAL_TABLET | Freq: Every evening | ORAL | Status: DC | PRN

## 2016-05-22 MED ORDER — ALBUTEROL SULFATE (2.5 MG/3ML) 0.083% IN NEBU
2.5000 mg | INHALATION_SOLUTION | RESPIRATORY_TRACT | Status: DC | PRN
  Administered 2016-05-22: 2.5 mg via RESPIRATORY_TRACT
  Filled 2016-05-22: qty 3

## 2016-05-22 NOTE — H&P (Signed)
History and Physical    Matthew Molina [id number] DOB: Jan 21, 1951 DOA: 05/22/2016  Referring MD/NP/PA: Noemi Chapel, EDP PCP: [name], MD  Patient coming from: Home  Chief Complaint: Shortness of breath  HPI: Matthew Molina is a 65 y.o. male with history of COPD, diabetes, hypertension, hyperlipidemia, history of schizophrenia who presents to the hospital today with shortness of breath. He states he has been having this for the past 4 days and was trying to treated at home with his inhalers however decided to come today because of persistent shortness of breath and hacking, nonproductive cough. He was found to be tight and wheezy in the ED and fail to improve despite continuous nebs and hence admission was requested for further evaluation and management. Labs are significant for sodium of 125, creatinine of 1.38, glucose of 60. Chest x-ray shows no acute cardiopulmonary disease.  Past Medical/Surgical History: Past Medical History  Diagnosis Date  . Nicotine addiction   . Schizophrenia ([place])   . Hyperlipidemia   . Obesity   . Diabetes mellitus   . Arthritis   . Acute respiratory failure with hypoxia ([place])   . Acute kidney injury ([place])   . Multiple lung nodules on CT 04/02/2015  . Depression   . COPD (chronic obstructive pulmonary disease) ([place])   . Diabetes mellitus without complication ([place])   . Hypertension   . Hypercholesterolemia     Past Surgical History  Procedure Laterality Date  . Colonoscopy N/A 04/01/2013    Procedure: COLONOSCOPY;  Surgeon: Danie Binder, MD;  Location: [facility];  Service: Endoscopy;  Laterality: N/A;  10:00 AM-moved to [place] notified pt  . Cataract extraction w/phaco Left 11/20/2013    Procedure: CATARACT EXTRACTION PHACO AND INTRAOCULAR LENS PLACEMENT (IOC);  Surgeon: Tonny Branch, MD;  Location: [facility] ORS;  Service: Ophthalmology;  Laterality: Left;  CDE:10.26  . Cataract extraction w/phaco Right 12/08/2013    Procedure: RIGHT EYE  CATARACT EXTRACTION PHACO AND INTRAOCULAR LENS PLACEMENT ;  Surgeon: Tonny Branch, MD;  Location: [facility] ORS;  Service: Ophthalmology;  Laterality: Right;  CDE 12.38  . Eye surgery Left 11/2013    cataract extraction  . Shoulder surgery    . Colonoscopy  2014    INCOMPLETE PREP IN R COLON  . Esophagogastroduodenoscopy N/A 02/18/2016    Procedure: ESOPHAGOGASTRODUODENOSCOPY (EGD);  Surgeon: Danie Binder, MD;  Location: [facility];  Service: Endoscopy;  Laterality: N/A;  1145  . Savory dilation N/A 02/18/2016    Procedure: SAVORY DILATION;  Surgeon: Danie Binder, MD;  Location: [facility];  Service: Endoscopy;  Laterality: N/A;     reports that he has been smoking Cigarettes.  He has been smoking about 1.00 pack per day. He does not have any smokeless tobacco history on file. He reports that he does not drink alcohol or use illicit drugs.  Allergies: Allergies  Allergen Reactions  . Sertraline Hcl     Stomach upset/pain  . Wellbutrin [Bupropion] Other (See Comments)    Makes stomach hurt    Family History:  Family History  Problem Relation Age of Onset  . Diabetes Mother   . Hypertension Mother   . Stroke Mother   . Diabetes Sister   . Heart disease Sister   . Kidney disease Father   . Colon cancer Neg Hx   . Colon polyps Neg Hx     Prior to Admission medications   Medication Sig Start Date End Date Taking? Authorizing  Provider  ACCU-CHEK AVIVA PLUS test strip TEST BLOOD SUGAR ONCE DAILY AS DIRECTED. 02/24/16  Yes Fayrene Helper, MD  acetaminophen (TYLENOL) 500 MG tablet Take 500 mg by mouth daily as needed.   Yes Historical Provider, MD  albuterol (PROVENTIL HFA;VENTOLIN HFA) 108 (90 BASE) MCG/ACT inhaler Inhale 2 puffs into the lungs every 6 (six) hours as needed for wheezing or shortness of breath. 07/29/15  Yes Ezequiel Essex, MD  amLODipine (NORVASC) 5 MG tablet TAKE (1) TABLET BY MOUTH DAILY. 05/05/16  Yes Fayrene Helper, MD  aspirin EC 81 MG tablet Take 81 mg by  mouth daily.   Yes Historical Provider, MD  budesonide-formoterol (SYMBICORT) 80-4.5 MCG/ACT inhaler INHALE 2 PUFFS INTO LUNGS TWICE DAILY. 02/22/16  Yes Fayrene Helper, MD  busPIRone (BUSPAR) 7.5 MG tablet One tablet twice daily 05/04/16  Yes Fayrene Helper, MD  glipiZIDE (GLUCOTROL XL) 10 MG 24 hr tablet Take 1 tablet (10 mg total) by mouth daily. 01/05/16  Yes Fayrene Helper, MD  ipratropium-albuterol (DUONEB) 0.5-2.5 (3) MG/3ML SOLN INHALE 1 VIAL VIA NEBULIZER EVERY FOUR HOURS AS NEEDED. 03/02/16  Yes Fayrene Helper, MD  metFORMIN (GLUCOPHAGE) 1000 MG tablet TAKE 1 TABLET BY MOUTH TWICE DAILY WITH FOOD FOR DIABETES. 12/23/15  Yes Fayrene Helper, MD  omeprazole (PRILOSEC) 20 MG capsule 1 PO 30 mins prior to breakfast and supper 02/18/16  Yes Danie Binder, MD  polyethylene glycol powder (GLYCOLAX/MIRALAX) powder MIX 1 CAPFUL (17G) IN 8 OUNCES OF JUICE/WATER AND DRINK ONCE DAILY. 12/23/15  Yes Fayrene Helper, MD  pravastatin (PRAVACHOL) 40 MG tablet TAKE (1) TABLET BY MOUTH AT BEDTIME FOR CHOLESTEROL. 01/05/16  Yes Fayrene Helper, MD  risperiDONE (RISPERDAL) 1 MG tablet Take 1 tablet (1 mg total) by mouth at bedtime. Takes 5mg  total every day at bedtime 03/23/16  Yes Fayrene Helper, MD  risperidone (RISPERDAL) 4 MG tablet Take 1 tablet (4 mg total) by mouth at bedtime. 03/23/16  Yes Fayrene Helper, MD  SPIRIVA HANDIHALER 18 MCG inhalation capsule INHALE 1 CAPSULE AS DIRECTED ONCE A DAY. 02/22/15  Yes Fayrene Helper, MD  traZODone (DESYREL) 50 MG tablet TAKE ONE TABLET BY MOUTH AT BEDTIME AS NEEDED FOR SLEEP. 05/18/16  Yes Fayrene Helper, MD  triamterene-hydrochlorothiazide (MAXZIDE-25) 37.5-25 MG tablet Take 1 tablet by mouth daily. 01/05/16  Yes Fayrene Helper, MD  valsartan (DIOVAN) 320 MG tablet Take 1 tablet (320 mg total) by mouth daily. 01/05/16  Yes Fayrene Helper, MD    Review of Systems:  Constitutional: Denies fever, chills, diaphoresis, appetite change  and fatigue.  HEENT: Denies photophobia, eye pain, redness, hearing loss, ear pain, congestion, sore throat, rhinorrhea, sneezing, mouth sores, trouble swallowing, neck pain, neck stiffness and tinnitus.   Respiratory: Denies  chest tightness,  and wheezing.   Cardiovascular: Denies chest pain, palpitations and leg swelling.  Gastrointestinal: Denies nausea, vomiting, abdominal pain, diarrhea, constipation, blood in stool and abdominal distention.  Genitourinary: Denies dysuria, urgency, frequency, hematuria, flank pain and difficulty urinating.  Endocrine: Denies: hot or cold intolerance, sweats, changes in hair or nails, polyuria, polydipsia. Musculoskeletal: Denies myalgias, back pain, joint swelling, arthralgias and gait problem.  Skin: Denies pallor, rash and wound.  Neurological: Denies dizziness, seizures, syncope, weakness, light-headedness, numbness and headaches.  Hematological: Denies adenopathy. Easy bruising, personal or family bleeding history  Psychiatric/Behavioral: Denies suicidal ideation, mood changes, confusion, nervousness, sleep disturbance and agitation    Physical Exam: Filed Vitals:  05/22/16 0757 05/22/16 0824 05/22/16 0830 05/22/16 1020  BP:  105/76 120/72 125/77  Pulse:  97 101 91  Temp:    97.7 F (36.5 C)  TempSrc:    Oral  Resp:  16 18 18   Height:      Weight:      SpO2: 96% 96% 97% 99%     Constitutional: NAD, calm, comfortable Eyes: PERRL, lids and conjunctivae normal ENMT: Mucous membranes are moist. Posterior pharynx clear of any exudate or lesions.Normal dentition.  Neck: normal, supple, no masses, no thyromegaly Respiratory: Fair air movement, bilateral expiratory wheezes  Cardiovascular: Regular rate and rhythm, no murmurs / rubs / gallops. No extremity edema. 2+ pedal pulses. No carotid bruits.  Abdomen: no tenderness, no masses palpated. No hepatosplenomegaly. Bowel sounds positive.  Musculoskeletal: no clubbing / cyanosis. No joint  deformity upper and lower extremities. Good ROM, no contractures. Normal muscle tone.  Skin: no rashes, lesions, ulcers. No induration Neurologic: CN 2-12 grossly intact. Sensation intact, DTR normal. Strength 5/5 in all 4.  Psychiatric: Normal judgment and insight. Alert and oriented x 3. Normal mood.    Labs on Admission: I have personally reviewed the following labs and imaging studies  CBC:  Recent Labs Lab 05/22/16 0618  WBC 7.7  NEUTROABS 3.3  HGB 10.2*  HCT 31.2*  MCV 86.7  PLT [id number]   Basic Metabolic Panel:  Recent Labs Lab 05/22/16 0618  NA 125*  K 4.1  CL 95*  CO2 21*  GLUCOSE 118*  BUN 10  CREATININE 1.38*  CALCIUM 9.6   GFR: Estimated Creatinine Clearance: 68.1 mL/min (by C-G formula based on Cr of 1.38). Liver Function Tests: No results for input(s): AST, ALT, ALKPHOS, BILITOT, PROT, ALBUMIN in the last 168 hours. No results for input(s): LIPASE, AMYLASE in the last 168 hours. No results for input(s): AMMONIA in the last 168 hours. Coagulation Profile: No results for input(s): INR, PROTIME in the last 168 hours. Cardiac Enzymes: No results for input(s): CKTOTAL, CKMB, CKMBINDEX, TROPONINI in the last 168 hours. BNP (last 3 results) No results for input(s): PROBNP in the last 8760 hours. HbA1C: No results for input(s): HGBA1C in the last 72 hours. CBG: No results for input(s): GLUCAP in the last 168 hours. Lipid Profile: No results for input(s): CHOL, HDL, LDLCALC, TRIG, CHOLHDL, LDLDIRECT in the last 72 hours. Thyroid Function Tests: No results for input(s): TSH, T4TOTAL, FREET4, T3FREE, THYROIDAB in the last 72 hours. Anemia Panel: No results for input(s): VITAMINB12, FOLATE, FERRITIN, TIBC, IRON, RETICCTPCT in the last 72 hours. Urine analysis:    Component Value Date/Time   COLORURINE lt. yellow 02/25/2009 1257   APPEARANCEUR Clear 02/25/2009 1257   LABSPEC 1.010 02/25/2009 1257   PHURINE 6.0 02/25/2009 1257   HGBUR negative 02/25/2009 1257    BILIRUBINUR negative 02/25/2009 1257   UROBILINOGEN 0.2 02/25/2009 1257   NITRITE negative 02/25/2009 1257   Sepsis Labs: @LABRCNTIP (procalcitonin:4,lacticidven:4) )No results found for this or any previous visit (from the past 240 hour(s)).   Radiological Exams on Admission: Dg Chest Portable 1 View  05/22/2016  CLINICAL DATA:  Shortness of breath and chest pain EXAM: PORTABLE CHEST 1 VIEW COMPARISON:  01/22/2016 FINDINGS: No cardiomegaly for technique. Stable aortic tortuosity. Negative hila. Hyperinflation in this patient with history of COPD. There is no edema, consolidation, effusion, or pneumothorax. IMPRESSION: Stable.  No evidence of active disease. Electronically Signed   By: Monte Fantasia M.D.   On: 05/22/2016 06:40    EKG: Independently reviewed.  Sinus tachycardia at a rate of 108, left axis deviation, left anterior fascicular block, no acute ischemic abnormalities  Assessment/Plan Principal Problem:   COPD exacerbation (HCC) Active Problems:   Hyperlipidemia   Essential hypertension   Diabetes mellitus type 2 in obese (HCC)   CKD (chronic kidney disease) stage 3, GFR 30-59 ml/min    Acute hypoxemic respiratory failure  -due to COPD, continue oxygen supplementation is required.  COPD with acute exacerbation -Start steroids, frequent nebs. Prophylactic antibiotics  Chronic kidney disease stage III -Creatinine is better than baseline.  Hyponatremia -Is at baseline, it appears baseline sodium is from 125-129 per chart review.  Type 2 diabetes -Check hemoglobin A1c, place on a moderate sliding scale of insulin.  Hypertension -Well-controlled, continue home medications  Hyperlipidemia -Continue statin   DVT prophylaxis: Lovenox  Code Status: Full code  Family Communication: Patient only  Disposition Plan: To be determined  Consults called: None  Admission status: Inpatient    Time Spent: 75 minutes  Lelon Frohlich MD [facility] Pager [phone]  If 7PM-7AM, please contact night-coverage [email or web address] Password [email or web address]  05/22/2016, 3:06 PM

## 2016-05-22 NOTE — ED Notes (Signed)
O2 stats remained around 94 and dropped to 93 very briefly. HR was 110, patient tolerated ambulation

## 2016-05-22 NOTE — ED Provider Notes (Signed)
CSN: [id number]     Arrival date & time 05/22/16  0533 History   First MD Initiated Contact with Patient 05/22/16 0543     Chief Complaint  Patient presents with  . Cough   LEVEL 5 CAVEAT DUE TO RESPIRATORY DISTRESS Patient is a 65 y.o. male presenting with cough. The history is provided by the patient.  Cough Cough characteristics:  Productive Sputum characteristics:  Clear Severity:  Severe Onset quality:  Gradual Duration:  4 days Timing:  Intermittent Progression:  Worsening Chronicity:  New Smoker: yes   Relieved by:  Nothing Worsened by:  Nothing tried Associated symptoms: no chest pain and no fever   Patient with h/o COPD, on home oxygen (2L PRN) presents for cough for 4 days He reports shortness of breath and wheezing He is still smoking cigarettes Due to persistent cough/shortness of breath, unable to obtain any other details  Past Medical History  Diagnosis Date  . Nicotine addiction   . Schizophrenia ([place])   . Hyperlipidemia   . Obesity   . Diabetes mellitus   . Arthritis   . Acute respiratory failure with hypoxia ([place])   . Acute kidney injury ([place])   . Multiple lung nodules on CT 04/02/2015  . Depression   . COPD (chronic obstructive pulmonary disease) ([place])   . Diabetes mellitus without complication ([place])   . Hypertension   . Hypercholesterolemia    Past Surgical History  Procedure Laterality Date  . Colonoscopy N/A 04/01/2013    Procedure: COLONOSCOPY;  Surgeon: Danie Binder, MD;  Location: [facility] END[facility] SUITE;  Service: Endoscopy;  Laterality: N/A;  10:00 AM-moved to [place] notified pt  . Cataract extraction w/phaco Left 11/20/2013    Procedure: CATARACT EXTRACTION PHACO AND INTRAOCULAR LENS PLACEMENT (IOC);  Surgeon: Tonny Branch, MD;  Location: [facility] ORS;  Service: Ophthalmology;  Laterality: Left;  CDE:10.26  . Cataract extraction w/phaco Right 12/08/2013    Procedure: RIGHT EYE CATARACT EXTRACTION PHACO AND INTRAOCULAR LENS PLACEMENT ;  Surgeon: Tonny Branch, MD;  Location: [facility] ORS;  Service: Ophthalmology;  Laterality: Right;  CDE 12.38  . Eye surgery Left 11/2013    cataract extraction  . Shoulder surgery    . Colonoscopy  2014    INCOMPLETE PREP IN R COLON  . Esophagogastroduodenoscopy N/A 02/18/2016    Procedure: ESOPHAGOGASTRODUODENOSCOPY (EGD);  Surgeon: Danie Binder, MD;  Location: [facility] SUITE;  Service: Endoscopy;  Laterality: N/A;  1145  . Savory dilation N/A 02/18/2016    Procedure: SAVORY DILATION;  Surgeon: Danie Binder, MD;  Location: [facility] SUITE;  Service: Endoscopy;  Laterality: N/A;   Family History  Problem Relation Age of Onset  . Diabetes Mother   . Hypertension Mother   . Stroke Mother   . Diabetes Sister   . Heart disease Sister   . Kidney disease Father   . Colon cancer Neg Hx   . Colon polyps Neg Hx    Social History  Substance Use Topics  . Smoking status: Current Every Day Smoker -- 1.00 packs/day    Types: Cigarettes  . Smokeless tobacco: None     Comment: quit smoking before he went to the hospital   . Alcohol Use: No    Review of Systems  Unable to perform ROS: Severe respiratory distress  Constitutional: Negative for fever.  Respiratory: Positive for cough.   Cardiovascular: Negative for chest pain.      Allergies  Sertraline hcl and Wellbutrin  Home Medications  Prior to Admission medications   Medication Sig Start Date End Date Taking? Authorizing Provider  ACCU-CHEK AVIVA PLUS test strip TEST BLOOD SUGAR ONCE DAILY AS DIRECTED. 02/24/16   Fayrene Helper, MD  acetaminophen (TYLENOL) 500 MG tablet Take 500 mg by mouth daily as needed.    Historical Provider, MD  albuterol (PROVENTIL HFA;VENTOLIN HFA) 108 (90 BASE) MCG/ACT inhaler Inhale 2 puffs into the lungs every 6 (six) hours as needed for wheezing or shortness of breath. 07/29/15   Ezequiel Essex, MD  amLODipine (NORVASC) 5 MG tablet TAKE (1) TABLET BY MOUTH DAILY. 05/05/16   Fayrene Helper, MD  aspirin EC 81 MG tablet  Take 81 mg by mouth daily.    Historical Provider, MD  budesonide-formoterol (SYMBICORT) 80-4.5 MCG/ACT inhaler INHALE 2 PUFFS INTO LUNGS TWICE DAILY. 02/22/16   Fayrene Helper, MD  busPIRone (BUSPAR) 7.5 MG tablet One tablet twice daily 05/04/16   Fayrene Helper, MD  glipiZIDE (GLUCOTROL XL) 10 MG 24 hr tablet Take 1 tablet (10 mg total) by mouth daily. 01/05/16   Fayrene Helper, MD  ipratropium-albuterol (DUONEB) 0.5-2.5 (3) MG/3ML SOLN INHALE 1 VIAL VIA NEBULIZER EVERY FOUR HOURS AS NEEDED. 03/02/16   Fayrene Helper, MD  metFORMIN (GLUCOPHAGE) 1000 MG tablet TAKE 1 TABLET BY MOUTH TWICE DAILY WITH FOOD FOR DIABETES. 12/23/15   Fayrene Helper, MD  omeprazole (PRILOSEC) 20 MG capsule 1 PO 30 mins prior to breakfast and supper 02/18/16   Danie Binder, MD  polyethylene glycol powder (GLYCOLAX/MIRALAX) powder MIX 1 CAPFUL (17G) IN 8 OUNCES OF JUICE/WATER AND DRINK ONCE DAILY. 12/23/15   Fayrene Helper, MD  pravastatin (PRAVACHOL) 40 MG tablet TAKE (1) TABLET BY MOUTH AT BEDTIME FOR CHOLESTEROL. 01/05/16   Fayrene Helper, MD  risperiDONE (RISPERDAL) 1 MG tablet Take 1 tablet (1 mg total) by mouth at bedtime. Takes 5mg  total every day at bedtime 03/23/16   Fayrene Helper, MD  risperidone (RISPERDAL) 4 MG tablet Take 1 tablet (4 mg total) by mouth at bedtime. 03/23/16   Fayrene Helper, MD  SPIRIVA HANDIHALER 18 MCG inhalation capsule INHALE 1 CAPSULE AS DIRECTED ONCE A DAY. Patient not taking: Reported on 05/04/2016 02/22/15   Fayrene Helper, MD  traZODone (DESYREL) 50 MG tablet TAKE ONE TABLET BY MOUTH AT BEDTIME AS NEEDED FOR SLEEP. 05/18/16   Fayrene Helper, MD  triamterene-hydrochlorothiazide (MAXZIDE-25) 37.5-25 MG tablet Take 1 tablet by mouth daily. 01/05/16   Fayrene Helper, MD  valsartan (DIOVAN) 320 MG tablet Take 1 tablet (320 mg total) by mouth daily. 01/05/16   Fayrene Helper, MD   BP 109/53 mmHg  Pulse 104  Temp(Src) 97.5 F (36.4 C) (Oral)  Resp 26  Ht  6\' 1"  (1.854 m)  Wt 108.863 kg  BMI 31.67 kg/m2  SpO2 97% Physical Exam CONSTITUTIONAL: Distress, ill appearing HEAD: Normocephalic/atraumatic EYES: EOMI ENMT: Mucous membranes moist NECK: supple no meningeal signs SPINE/BACK:entire spine nontender CV: tachycardic S1/S2 noted LUNGS: wheezing bilaterally, tachypnea noted, coughs throughout exam ABDOMEN: soft, nontender NEURO: Pt is awake/alert/appropriate, moves all extremitiesx4.  No facial droop.   EXTREMITIES: pulses normal/equal, full ROM, no LE edema SKIN: warm, color normal PSYCH: mildly anxious  ED Course  Procedures   CRITICAL CARE Performed by: Sharyon Cable Total critical care time: 31 minutes Critical care time was exclusive of separately billable procedures and treating other patients. Critical care was necessary to treat or prevent imminent or life-threatening deterioration. Critical care  was time spent personally by me on the following activities: development of treatment plan with patient and/or surrogate as well as nursing, discussions with consultants, evaluation of patient's response to treatment, examination of patient, obtaining history from patient or surrogate, ordering and performing treatments and interventions, ordering and review of laboratory studies, ordering and review of radiographic studies, pulse oximetry and re-evaluation of patient's condition. PATIENT WITH COPD EXACERBATION REQUIRING MULTIPLE NEBULIZED TREATMENTS INCLUDING CONTINUOUS NEB TREATMENT.  HE WILL REQUIRE ADMISSION DUE TO CONTINUED SHORTNESS OF BREATH  5:59 AM Pt already received 2 treatments per EMS, now receiving albuterol 10mg  Will follow closely 7:52 AM PT RECEIVED NEB TREATMENTS HE IS IMPROVED BUT STILL WITH WHEEZE/TACHYPNEA.  HE IS STILL COUGHING FREQUENTLY AND LIMITED AIR MOVEMENT WILL ADMIT AS I FEEL HE IS HIGH RISK FOR BOUNCING BACK/WORSENING OVER NEXT 24-48 HOURS PT AGREEABLE WITH PLAN 8:01 AM D/w dr Jerilee Hoh for  admission for COPD exacerbation  Labs Review Labs Reviewed  CBC WITH DIFFERENTIAL/PLATELET - Abnormal; Notable for the following:    RBC 3.60 (*)    Hemoglobin 10.2 (*)    HCT 31.2 (*)    Eosinophils Absolute 0.9 (*)    All other components within normal limits  BASIC METABOLIC PANEL - Abnormal; Notable for the following:    Sodium 125 (*)    Chloride 95 (*)    CO2 21 (*)    Glucose, Bld 118 (*)    Creatinine, Ser 1.38 (*)    GFR calc non Af Amer 52 (*)    GFR calc Af Amer 60 (*)    All other components within normal limits    Imaging Review Dg Chest Portable 1 View  05/22/2016  CLINICAL DATA:  Shortness of breath and chest pain EXAM: PORTABLE CHEST 1 VIEW COMPARISON:  01/22/2016 FINDINGS: No cardiomegaly for technique. Stable aortic tortuosity. Negative hila. Hyperinflation in this patient with history of COPD. There is no edema, consolidation, effusion, or pneumothorax. IMPRESSION: Stable.  No evidence of active disease. Electronically Signed   By: Monte Fantasia M.D.   On: 05/22/2016 06:40   I have personally reviewed and evaluated these images and lab results as part of my medical decision-making.   EKG Interpretation   Date/Time:  Monday May 22 2016 05:38:34 EDT Ventricular Rate:  107 PR Interval:  176 QRS Duration: 96 QT Interval:  342 QTC Calculation: 456 R Axis:   -51 Text Interpretation:  Sinus tachycardia Left anterior fascicular block Low  voltage, extremity and precordial leads Consider anterior infarct No  significant change since last tracing Confirmed by Christy Gentles  MD, Elenore Rota  [phone]) on 05/22/2016 6:57:25 AM     Medications  albuterol (PROVENTIL) (2.5 MG/3ML) 0.083% nebulizer solution 5 mg (not administered)  ipratropium (ATROVENT) 0.02 % nebulizer solution (0.5 mg  Given 05/22/16 0544)  albuterol (PROVENTIL, VENTOLIN) (5 MG/ML) 0.5% continuous inhalation solution (  Given 05/22/16 0545)  methylPREDNISolone sodium succinate (SOLU-MEDROL) 125 mg/2 mL injection  125 mg (125 mg Intravenous Given 05/22/16 0550)    MDM   Final diagnoses:  Chronic obstructive pulmonary disease with acute exacerbation [facility])    Nursing notes including past medical history and social history reviewed and considered in documentation xrays/imaging reviewed by myself and considered during evaluation Labs/vital reviewed myself and considered during evaluation Previous records reviewed and considered - previous admissions have been reviewed     Ripley Fraise, MD 05/22/16 [phone]

## 2016-05-22 NOTE — ED Notes (Signed)
Pt brought in by [facility] for c/o cough x 4 days; pt coughing up clear phlem

## 2016-05-22 NOTE — ED Notes (Signed)
Dr Christy Gentles at bedside,

## 2016-05-22 NOTE — ED Notes (Signed)
RT at bedside.

## 2016-05-22 NOTE — ED Notes (Signed)
Pt c/o cough, increasing sob that started Thursday,

## 2016-05-22 NOTE — ED Notes (Signed)
RT made aware of breathing tx. 

## 2016-05-22 NOTE — ED Notes (Signed)
Pt reports that he is breathing better, continues to have cough,

## 2016-05-23 LAB — CBC
HCT: 34.4 % — ABNORMAL LOW (ref 39.0–52.0)
Hemoglobin: 11.4 g/dL — ABNORMAL LOW (ref 13.0–17.0)
MCH: 28.6 pg (ref 26.0–34.0)
MCHC: 33.1 g/dL (ref 30.0–36.0)
MCV: 86.2 fL (ref 78.0–100.0)
Platelets: 296 10*3/uL (ref 150–400)
RBC: 3.99 MIL/uL — ABNORMAL LOW (ref 4.22–5.81)
RDW: 14.2 % (ref 11.5–15.5)
WBC: 14.5 10*3/uL — ABNORMAL HIGH (ref 4.0–10.5)

## 2016-05-23 LAB — BASIC METABOLIC PANEL
Anion gap: 9 (ref 5–15)
BUN: 21 mg/dL — ABNORMAL HIGH (ref 6–20)
CO2: 24 mmol/L (ref 22–32)
Calcium: 10.4 mg/dL — ABNORMAL HIGH (ref 8.9–10.3)
Chloride: 93 mmol/L — ABNORMAL LOW (ref 101–111)
Creatinine, Ser: 1.39 mg/dL — ABNORMAL HIGH (ref 0.61–1.24)
GFR calc Af Amer: 59 mL/min — ABNORMAL LOW (ref >60)
GFR calc non Af Amer: 51 mL/min — ABNORMAL LOW (ref >60)
Glucose, Bld: 228 mg/dL — ABNORMAL HIGH (ref 65–99)
Potassium: 4.8 mmol/L (ref 3.5–5.1)
Sodium: 126 mmol/L — ABNORMAL LOW (ref 135–145)

## 2016-05-23 LAB — HEMOGLOBIN A1C
Hgb A1c MFr Bld: 5.9 % — ABNORMAL HIGH (ref 4.8–5.6)
Mean Plasma Glucose: 123 mg/dL

## 2016-05-23 LAB — GLUCOSE, CAPILLARY
Glucose-Capillary: 253 mg/dL — ABNORMAL HIGH (ref 65–99)
Glucose-Capillary: 320 mg/dL — ABNORMAL HIGH (ref 65–99)

## 2016-05-23 MED ORDER — CETYLPYRIDINIUM CHLORIDE 0.05 % MT LIQD
7.0000 mL | Freq: Two times a day (BID) | OROMUCOSAL | Status: DC
  Administered 2016-05-23 (×2): 7 mL via OROMUCOSAL

## 2016-05-23 MED ORDER — PREDNISONE 10 MG PO TABS: 10.0000 mg | ORAL_TABLET | Freq: Every day | ORAL | Status: DC

## 2016-05-23 NOTE — Progress Notes (Signed)
Pt. Discharged home. Both peripheral IV's discontinued. Prescription given.

## 2016-05-23 NOTE — Care Management Note (Signed)
Case Management Note  Patient Details  Name: MARICO GENERAL MRN: [id number] Date of Birth: [birth year]  Subjective/Objective:     Spoke with patient who is alert and answers questions appropriately. He is from home alone. Uses a cane for ambulation. Denies difficulty obtaining meds. Uses [employer].                Action/Plan: Home with self care.    Expected Discharge Date:                  Expected Discharge Plan:  Home/Self Care  In-House Referral:     Discharge planning Services  CM Consult  Post Acute Care Choice:    Choice offered to:     DME Arranged:    DME Agency:     HH Arranged:    [place] Agency:     Status of Service:  Completed, signed off  Medicare Important Message Given:    Date Medicare IM Given:    Medicare IM give by:    Date Additional Medicare IM Given:    Additional Medicare Important Message give by:     If discussed at [place] of Stay Meetings, dates discussed:    Additional Comments:  Alvie Heidelberg, RN 05/23/2016, 1:31 PM

## 2016-05-23 NOTE — Discharge Summary (Signed)
Physician Discharge Summary  Matthew Molina [id number] DOB: 03/25/1951 DOA: 05/22/2016  PCP: [name], MD  Admit date: 05/22/2016 Discharge date: 05/23/2016  Time spent: 45 minutes  Recommendations for Outpatient Follow-up:  -We'll be discharged home today. -Advised to follow-up with primary care provider in 2 weeks.   Discharge Diagnoses:  Principal Problem:   COPD exacerbation ([place]) Active Problems:   Hyperlipidemia   Essential hypertension   Diabetes mellitus type 2 in obese (HCC)   CKD (chronic kidney disease) stage 3, GFR 30-59 ml/min   Discharge Condition: Stable and improved  Filed Weights   05/22/16 0540  Weight: 108.863 kg (240 lb)    History of present illness:  Matthew Molina is a 65 y.o. male with history of COPD, diabetes, hypertension, hyperlipidemia, history of schizophrenia who presents to the hospital today with shortness of breath. He states he has been having this for the past 4 days and was trying to treated at home with his inhalers however decided to come today because of persistent shortness of breath and hacking, nonproductive cough. He was found to be tight and wheezy in the ED and fail to improve despite continuous nebs and hence admission was requested for further evaluation and management. Labs are significant for sodium of 125, creatinine of 1.38, glucose of 60. Chest x-ray shows no acute cardiopulmonary disease.  Hospital Course:   Acute hypoxemic respiratory failure -On account of COPD with acute exacerbation. -Currently has no oxygen requirements.  COPD with acute exacerbation -Much improved, no wheezing, feels ready to go home. -We'll discharge home on a steroid taper.  Chronic kidney disease stage III  -creatinine is at baseline to slightly improved.  Hyponatremia -Is at baseline. Patient appears to have baseline sodium from 125-129 per chart review. Next  Hypertension -Well-controlled  Hyperlipidemia -Continue  statin  Type 2 diabetes -Fair control while in the hospital.  Procedures:  None   Consultations:  None  Discharge Instructions  Discharge Instructions    AMB Referral to [facility]    Complete by:  As directed   Please refer To RNCM community for transition of care program calls and evaluate for home visit Active consent on file for [facility], active with [facility]. For questions contact:  Royetta Crochet. Niemczura, RN, BSN, [place] [phone]) Business Cell  [phone]) Toll Free Office  Reason for consult:  [facility] active  Diagnoses of:  COPD/ Pneumonia  Expected date of contact:  1-3 days (reserved for hospital discharges)     Diet - low sodium heart healthy    Complete by:  As directed      Increase activity slowly    Complete by:  As directed             Medication List    TAKE these medications        ACCU-CHEK AVIVA PLUS test strip  Generic drug:  glucose blood  TEST BLOOD SUGAR ONCE DAILY AS DIRECTED.     acetaminophen 500 MG tablet  Commonly known as:  TYLENOL  Take 500 mg by mouth daily as needed.     albuterol 108 (90 Base) MCG/ACT inhaler  Commonly known as:  PROVENTIL HFA;VENTOLIN HFA  Inhale 2 puffs into the lungs every 6 (six) hours as needed for wheezing or shortness of breath.     amLODipine 5 MG tablet  Commonly known as:  NORVASC  TAKE (1) TABLET BY MOUTH DAILY.  aspirin EC 81 MG tablet  Take 81 mg by mouth daily.     budesonide-formoterol 80-4.5 MCG/ACT inhaler  Commonly known as:  SYMBICORT  INHALE 2 PUFFS INTO LUNGS TWICE DAILY.     busPIRone 7.5 MG tablet  Commonly known as:  BUSPAR  One tablet twice daily     glipiZIDE 10 MG 24 hr tablet  Commonly known as:  GLUCOTROL XL  Take 1 tablet (10 mg total) by mouth daily.     ipratropium-albuterol 0.5-2.5 (3) MG/3ML Soln  Commonly known as:  DUONEB  INHALE 1 VIAL VIA NEBULIZER EVERY FOUR HOURS AS NEEDED.     metFORMIN 1000 MG  tablet  Commonly known as:  GLUCOPHAGE  TAKE 1 TABLET BY MOUTH TWICE DAILY WITH FOOD FOR DIABETES.     omeprazole 20 MG capsule  Commonly known as:  PRILOSEC  1 PO 30 mins prior to breakfast and supper     polyethylene glycol powder powder  Commonly known as:  GLYCOLAX/MIRALAX  MIX 1 CAPFUL (17G) IN 8 OUNCES OF JUICE/WATER AND DRINK ONCE DAILY.     pravastatin 40 MG tablet  Commonly known as:  PRAVACHOL  TAKE (1) TABLET BY MOUTH AT BEDTIME FOR CHOLESTEROL.     predniSONE 10 MG tablet  Commonly known as:  DELTASONE  Take 1 tablet (10 mg total) by mouth daily with breakfast. Take 6 tablets today and then decrease by 1 tablet daily until none are left.     risperidone 4 MG tablet  Commonly known as:  RISPERDAL  Take 1 tablet (4 mg total) by mouth at bedtime.     risperiDONE 1 MG tablet  Commonly known as:  RISPERDAL  Take 1 tablet (1 mg total) by mouth at bedtime. Takes 5mg  total every day at bedtime     SPIRIVA HANDIHALER 18 MCG inhalation capsule  Generic drug:  tiotropium  INHALE 1 CAPSULE AS DIRECTED ONCE A DAY.     traZODone 50 MG tablet  Commonly known as:  DESYREL  TAKE ONE TABLET BY MOUTH AT BEDTIME AS NEEDED FOR SLEEP.     triamterene-hydrochlorothiazide 37.5-25 MG tablet  Commonly known as:  MAXZIDE-25  Take 1 tablet by mouth daily.     valsartan 320 MG tablet  Commonly known as:  DIOVAN  Take 1 tablet (320 mg total) by mouth daily.       Allergies  Allergen Reactions  . Sertraline Hcl     Stomach upset/pain  . Wellbutrin [Bupropion] Other (See Comments)    Makes stomach hurt       Follow-up Information    Follow up with [name], MD. Schedule an appointment as soon as possible for a visit in 2 weeks.   Specialty:  Family Medicine   Contact information:   [address], [place] [address] [phone]        The results of significant diagnostics from this hospitalization (including imaging, microbiology, ancillary and  laboratory) are listed below for reference.    Significant Diagnostic Studies: Dg Chest Portable 1 View  05/22/2016  CLINICAL DATA:  Shortness of breath and chest pain EXAM: PORTABLE CHEST 1 VIEW COMPARISON:  01/22/2016 FINDINGS: No cardiomegaly for technique. Stable aortic tortuosity. Negative hila. Hyperinflation in this patient with history of COPD. There is no edema, consolidation, effusion, or pneumothorax. IMPRESSION: Stable.  No evidence of active disease. Electronically Signed   By: Monte Fantasia M.D.   On: 05/22/2016 06:40    Microbiology: No results found  for this or any previous visit (from the past 240 hour(s)).   Labs: Basic Metabolic Panel:  Recent Labs Lab 05/22/16 0618 05/23/16 0635  NA 125* 126*  K 4.1 4.8  CL 95* 93*  CO2 21* 24  GLUCOSE 118* 228*  BUN 10 21*  CREATININE 1.38* 1.39*  CALCIUM 9.6 10.4*   Liver Function Tests: No results for input(s): AST, ALT, ALKPHOS, BILITOT, PROT, ALBUMIN in the last 168 hours. No results for input(s): LIPASE, AMYLASE in the last 168 hours. No results for input(s): AMMONIA in the last 168 hours. CBC:  Recent Labs Lab 05/22/16 0618 05/23/16 0635  WBC 7.7 14.5*  NEUTROABS 3.3  --   HGB 10.2* 11.4*  HCT 31.2* 34.4*  MCV 86.7 86.2  PLT 252 296   Cardiac Enzymes: No results for input(s): CKTOTAL, CKMB, CKMBINDEX, TROPONINI in the last 168 hours. BNP: BNP (last 3 results)  Recent Labs  07/29/15 0955 08/18/15 1430  BNP 21.0 12.0    ProBNP (last 3 results) No results for input(s): PROBNP in the last 8760 hours.  CBG:  Recent Labs Lab 05/22/16 1715 05/22/16 2012 05/23/16 0725 05/23/16 1121  GLUCAP 201* 231* 253* 320*       Signed:  Lelon Frohlich  [facility] Pager: [phone] 05/23/2016, 3:29 PM

## 2016-05-23 NOTE — Consult Note (Signed)
   [facility] CM Inpatient Consult   05/23/2016  ADAIN PYO [birth year] [id number]  Patient is currently active with [facility] for chronic disease management services.  Patient has been engaged by a [employer] and LCSW.  Our community based plan of care has focused on disease management and community resource support.  Patient will receive a post discharge transition of care call and will be evaluated for monthly home visits for assessments and disease process education.  Made Inpatient Case Manager aware that [facility] following. Of note, [facility] Care Management services does not replace or interfere with any services that are arranged by inpatient case management or social work.   For additional questions or referrals please contact:  Royetta Crochet. [name], RN, BSN, [place] [phone]

## 2016-05-23 NOTE — Progress Notes (Signed)
Inpatient Diabetes Program Recommendations  AACE/ADA: New Consensus Statement on Inpatient Glycemic Control (2015)  Target Ranges:  Prepandial:   less than 140 mg/dL      Peak postprandial:   less than 180 mg/dL (1-2 hours)      Critically ill patients:  140 - 180 mg/dL  Results for Matthew Molina, Matthew Molina (MRN [id number]) as of 05/23/2016 09:06  Ref. Range 05/22/2016 17:15 05/22/2016 20:12 05/23/2016 07:25  Glucose-Capillary Latest Ref Range: 65-99 mg/dL 201 (H) 231 (H) 253 (H)   Review of Glycemic Control  Diabetes history: DM2 Outpatient Diabetes medications: Glipizide XL 10 daily, Metformin 1000 mg BID Current orders for Inpatient glycemic control: Novolog 0-15 units TID with meals, Novolog 0-5 units QHS, Novolog 4 units TID with meals for meal coverage  Inpatient Diabetes Program Recommendations: Insulin - Basal: If steroids are continued, please consider ordering Lantus 10 units daily starting now (based on 108 kg x 0.1 units). Please note that if Lantus is ordered as recommended, it will likely need to be adjusted as steroids are tapered.  Thanks, Barnie Alderman, RN, MSN, CDE Diabetes Coordinator Inpatient Diabetes Program [phone] (Team Pager from [place] to [place]) [phone] ([facility] office) [phone] [facility] office) [phone] [facility] office)

## 2016-05-25 ENCOUNTER — Other Ambulatory Visit: Payer: Self-pay | Admitting: *Deleted

## 2016-05-25 NOTE — Patient Outreach (Signed)
Transition of care call-initial Patient was recently discharged 05/22/16, inpatient for COPD exacerbation Spoke with patient. Patient reports he is doing well, he is back to using nebs bid. He has not made post hospital follow up. Patient was given prednisone and he has picked up. He also was given dulera while inpatient, it is not on discharge list and patient is back to using his usual inhalers and nebulizer medications-instructed to check with primary care on if she would want him to continue dulera.   Assessment: COPD: Needs to clarify about dulera usage. Patient back to baseline. Patient to continue self monitor for COPD zones.  Patient was recently discharged from hospital and all medications have been reviewed.  PLAN: Request 2week post hospital follow up appointment from primary via in basket Call again next week for transition of care. Royetta Crochet. [name], RN, BSN, [place] [phone]

## 2016-05-26 NOTE — Patient Outreach (Signed)
Call to patient to give Appointment information [name] 06/07/16 3:30 For post hospital follow up. He was able to write down appointment and denies any conflicts. Royetta Crochet. [name], RN, BSN, [place] [phone]

## 2016-05-30 ENCOUNTER — Ambulatory Visit: Payer: Commercial Managed Care - HMO | Admitting: Podiatry

## 2016-06-01 ENCOUNTER — Other Ambulatory Visit: Payer: Self-pay | Admitting: *Deleted

## 2016-06-01 NOTE — Patient Outreach (Signed)
Transition of care call attempted. Spoke with patient, he states it is not a good time to talk right now. Patient requests call back on Monday. Plan to call next week at patient request. Royetta Crochet. [name], RN, BSN, [place] [phone]

## 2016-06-05 ENCOUNTER — Other Ambulatory Visit: Payer: Self-pay | Admitting: *Deleted

## 2016-06-05 LAB — COMPLETE METABOLIC PANEL WITH GFR
ALT: 25 U/L (ref 9–46)
AST: 18 U/L (ref 10–35)
Albumin: 4 g/dL (ref 3.6–5.1)
Alkaline Phosphatase: 52 U/L (ref 40–115)
BUN: 8 mg/dL (ref 7–25)
CO2: 22 mmol/L (ref 20–31)
Calcium: 9.3 mg/dL (ref 8.6–10.3)
Chloride: 98 mmol/L (ref 98–110)
Creat: 1.19 mg/dL (ref 0.70–1.25)
GFR, Est African American: 73 mL/min (ref >=60)
GFR, Est Non African American: 63 mL/min (ref >=60)
Glucose, Bld: 102 mg/dL — ABNORMAL HIGH (ref 65–99)
Potassium: 4.2 mmol/L (ref 3.5–5.3)
Sodium: 132 mmol/L — ABNORMAL LOW (ref 135–146)
Total Bilirubin: 0.4 mg/dL (ref 0.2–1.2)
Total Protein: 6.7 g/dL (ref 6.1–8.1)

## 2016-06-05 LAB — HEMOGLOBIN A1C
Hgb A1c MFr Bld: 6.2 % — ABNORMAL HIGH (ref <5.7)
Mean Plasma Glucose: 131 mg/dL

## 2016-06-05 LAB — TSH: TSH: 2.11 mIU/L (ref 0.40–4.50)

## 2016-06-05 NOTE — Patient Outreach (Signed)
Transition of care call Spoke with patient. Patient reporting he is in the "green" zone for his breathing. He does report a possible issue with neb machine, he is going to check and see if he has extra tubing He states he does not have money to buy extra tubing if he needs it. Patient states he has stopped smoking again " but it is hard"  Patient agrees to visit next week.  Assessment:  COPD: reviewed zones-patient reports "green" today. Encouraged regarding stopping smoking and encouraged him to take it one day a time.. NEB machine issue: reminded patient to contact company that provided machine, patient may not have a charge due to having Medicaid secondary to Medicare. He wants to check for extra tubing before calling.  Plan: Patient will check on neb tubing and follow up with company Visit next week and continue transition of care program. Follow up on his nebulizer tubing at visit if needed.  Royetta Crochet. [name], RN, BSN, [place] [phone]

## 2016-06-06 LAB — PSA, MEDICARE: PSA: 4.96 ng/mL — ABNORMAL HIGH (ref <=4.00)

## 2016-06-07 ENCOUNTER — Ambulatory Visit: Payer: Self-pay | Admitting: Family Medicine

## 2016-06-07 ENCOUNTER — Ambulatory Visit: Payer: Self-pay

## 2016-06-14 ENCOUNTER — Encounter: Payer: Self-pay | Admitting: *Deleted

## 2016-06-14 ENCOUNTER — Other Ambulatory Visit: Payer: Self-pay | Admitting: *Deleted

## 2016-06-14 NOTE — Patient Outreach (Signed)
[facility]) Care Management   06/14/2016  Matthew Molina [birth year] [id number]  Matthew Molina is an 65 y.o. male  Subjective:  "I am doing ok" Patient states he has not felt well mentally the last couple of weeks, he missed his primary care appointment last week. "I am still smoking" about 1 1/2 pack per day. Denies any new concerns or issues for RNCM  Objective:   BP 104/58 mmHg  Pulse 95  Resp 20  Wt 240 lb (108.863 kg)  SpO2 96% Review of Systems  Constitutional: Negative.   HENT: Negative.   Eyes: Negative.        Eye exam in September  Respiratory: Positive for cough.   Gastrointestinal: Negative.   Skin: Negative.   Psychiatric/Behavioral:       Hx of schizophrenia    Physical Exam  Constitutional: He is oriented to person, place, and time.  Cardiovascular: Normal rate.   Respiratory: Effort normal.  GI: Soft. Bowel sounds are normal.  Musculoskeletal: Normal range of motion.  Neurological: He is alert and oriented to person, place, and time.  Skin: Skin is warm and dry.  Psychiatric:  Flat affect    Encounter Medications:   Outpatient Encounter Prescriptions as of 06/14/2016  Medication Sig Note  . ACCU-CHEK AVIVA PLUS test strip TEST BLOOD SUGAR ONCE DAILY AS DIRECTED.   Marland Kitchen acetaminophen (TYLENOL) 500 MG tablet Take 500 mg by mouth daily as needed. 02/24/2016: Reports taking approximately once per week  . albuterol (PROVENTIL HFA;VENTOLIN HFA) 108 (90 BASE) MCG/ACT inhaler Inhale 2 puffs into the lungs every 6 (six) hours as needed for wheezing or shortness of breath. 02/24/2016: Reports using as needed. Reports using approximately once per week  . amLODipine (NORVASC) 5 MG tablet TAKE (1) TABLET BY MOUTH DAILY.   Marland Kitchen aspirin EC 81 MG tablet Take 81 mg by mouth daily.   . budesonide-formoterol (SYMBICORT) 80-4.5 MCG/ACT inhaler INHALE 2 PUFFS INTO LUNGS TWICE DAILY.   . busPIRone (BUSPAR) 7.5 MG tablet One tablet twice daily 06/14/2016: Taking  1/2 bid  . glipiZIDE (GLUCOTROL XL) 10 MG 24 hr tablet Take 1 tablet (10 mg total) by mouth daily.   Marland Kitchen ipratropium-albuterol (DUONEB) 0.5-2.5 (3) MG/3ML SOLN INHALE 1 VIAL VIA NEBULIZER EVERY FOUR HOURS AS NEEDED. 05/25/2016: Using about 2 times a day   . metFORMIN (GLUCOPHAGE) 1000 MG tablet TAKE 1 TABLET BY MOUTH TWICE DAILY WITH FOOD FOR DIABETES.   Marland Kitchen omeprazole (PRILOSEC) 20 MG capsule 1 PO 30 mins prior to breakfast and supper   . polyethylene glycol powder (GLYCOLAX/MIRALAX) powder MIX 1 CAPFUL (17G) IN 8 OUNCES OF JUICE/WATER AND DRINK ONCE DAILY. 06/14/2016: Uses only as needed  . pravastatin (PRAVACHOL) 40 MG tablet TAKE (1) TABLET BY MOUTH AT BEDTIME FOR CHOLESTEROL.   Marland Kitchen predniSONE (DELTASONE) 10 MG tablet Take 1 tablet (10 mg total) by mouth daily with breakfast. Take 6 tablets today and then decrease by 1 tablet daily until none are left.   . risperiDONE (RISPERDAL) 1 MG tablet Take 1 tablet (1 mg total) by mouth at bedtime. Takes 5mg  total every day at bedtime   . risperidone (RISPERDAL) 4 MG tablet Take 1 tablet (4 mg total) by mouth at bedtime.   Marland Kitchen SPIRIVA HANDIHALER 18 MCG inhalation capsule INHALE 1 CAPSULE AS DIRECTED ONCE A DAY.   . traZODone (DESYREL) 50 MG tablet TAKE ONE TABLET BY MOUTH AT BEDTIME AS NEEDED FOR SLEEP.   Marland Kitchen triamterene-hydrochlorothiazide (MAXZIDE-25) 37.5-25 MG tablet  Take 1 tablet by mouth daily.   . valsartan (DIOVAN) 320 MG tablet Take 1 tablet (320 mg total) by mouth daily.    No facility-administered encounter medications on file as of 06/14/2016.    Functional Status:   In your present state of health, do you have any difficulty performing the following activities: 06/14/2016 05/22/2016  Hearing? N N  Vision? N N  Difficulty concentrating or making decisions? N N  Walking or climbing stairs? N N  Dressing or bathing? N N  Doing errands, shopping? N N  Preparing Food and eating ? Y -  Using the Toilet? N -  In the past six months, have you accidently  leaked urine? N -  Do you have problems with loss of bowel control? N -  Managing your Medications? Y -  Managing your Finances? N -  Housekeeping or managing your Housekeeping? Y -    Fall/Depression Screening:    PHQ 2/9 Scores 06/14/2016 04/11/2016 02/28/2016 01/31/2016 04/01/2015 09/02/2013  PHQ - 2 Score - 1 0 4 3 1   PHQ- 9 Score - - - 6 7 3   Exception Documentation Medical reason - - - - -    Assessment:   COPD: patient reports he is green/yellow zone today Diabetes: stable  Plan:  THN CM Care Plan Problem One        Most Recent Value   Care Plan Problem One  Knowledge deficit regarding COPD self-management.   Role Documenting the Problem One  Care Management [place] for Problem One  Active   THN Long Term Goal (31-90 days)  Patient will be able to discuss 3 self management strategies he uses for COPD within 90 days.   Interventions for Problem One Long Term Goal  reviewed what zone patient in today, able to tell Green/Yellow. Reinforced to self monitor and report symptoms    THN CM Care Plan Problem Two        Most Recent Value   THN CM Short Term Goal #1 (0-30 days)  Patient will not have any COPD related hospitalizations in the next 30 days   THN CM Short Term Goal #1 Start Date  06/05/16   Interventions for Short Term Goal #2   reinforced COPD zones, patient able to report he was in the "green/yellow" zone today. Encouraged patient around trying to stop smoking     Will continue transition of care  [facility] E. [name], RN, BSN, [place] [phone]

## 2016-06-15 ENCOUNTER — Emergency Department (HOSPITAL_COMMUNITY)
Admission: EM | Admit: 2016-06-15 | Discharge: 2016-06-15 | Disposition: A | Discharge status: Home / Self Care | Payer: Commercial Managed Care - HMO | Attending: Emergency Medicine | Admitting: Emergency Medicine

## 2016-06-15 ENCOUNTER — Encounter (HOSPITAL_COMMUNITY): Payer: Self-pay

## 2016-06-15 ENCOUNTER — Emergency Department (HOSPITAL_COMMUNITY): Payer: Commercial Managed Care - HMO

## 2016-06-15 DIAGNOSIS — E669 Obesity, unspecified: Secondary | ICD-10-CM | POA: Insufficient documentation

## 2016-06-15 DIAGNOSIS — Z79899 Other long term (current) drug therapy: Secondary | ICD-10-CM | POA: Diagnosis not present

## 2016-06-15 DIAGNOSIS — F17210 Nicotine dependence, cigarettes, uncomplicated: Secondary | ICD-10-CM | POA: Insufficient documentation

## 2016-06-15 DIAGNOSIS — E119 Type 2 diabetes mellitus without complications: Secondary | ICD-10-CM | POA: Diagnosis not present

## 2016-06-15 DIAGNOSIS — Z7982 Long term (current) use of aspirin: Secondary | ICD-10-CM | POA: Insufficient documentation

## 2016-06-15 DIAGNOSIS — E785 Hyperlipidemia, unspecified: Secondary | ICD-10-CM | POA: Diagnosis not present

## 2016-06-15 DIAGNOSIS — F209 Schizophrenia, unspecified: Secondary | ICD-10-CM | POA: Diagnosis not present

## 2016-06-15 DIAGNOSIS — J441 Chronic obstructive pulmonary disease with (acute) exacerbation: Secondary | ICD-10-CM | POA: Diagnosis not present

## 2016-06-15 DIAGNOSIS — M1990 Unspecified osteoarthritis, unspecified site: Secondary | ICD-10-CM | POA: Insufficient documentation

## 2016-06-15 DIAGNOSIS — Z6831 Body mass index (BMI) 31.0-31.9, adult: Secondary | ICD-10-CM | POA: Diagnosis not present

## 2016-06-15 DIAGNOSIS — Z7984 Long term (current) use of oral hypoglycemic drugs: Secondary | ICD-10-CM | POA: Diagnosis not present

## 2016-06-15 DIAGNOSIS — I10 Essential (primary) hypertension: Secondary | ICD-10-CM | POA: Insufficient documentation

## 2016-06-15 DIAGNOSIS — F329 Major depressive disorder, single episode, unspecified: Secondary | ICD-10-CM | POA: Insufficient documentation

## 2016-06-15 DIAGNOSIS — R0602 Shortness of breath: Secondary | ICD-10-CM | POA: Diagnosis present

## 2016-06-15 LAB — CBC WITH DIFFERENTIAL/PLATELET
Basophils Absolute: 0 10*3/uL (ref 0.0–0.1)
Basophils Relative: 0 %
Eosinophils Absolute: 0.6 10*3/uL (ref 0.0–0.7)
Eosinophils Relative: 9 %
HCT: 34.6 % — ABNORMAL LOW (ref 39.0–52.0)
Hemoglobin: 11.4 g/dL — ABNORMAL LOW (ref 13.0–17.0)
Lymphocytes Relative: 33 %
Lymphs Abs: 2.2 10*3/uL (ref 0.7–4.0)
MCH: 28.9 pg (ref 26.0–34.0)
MCHC: 32.9 g/dL (ref 30.0–36.0)
MCV: 87.6 fL (ref 78.0–100.0)
Monocytes Absolute: 0.4 10*3/uL (ref 0.1–1.0)
Monocytes Relative: 5 %
Neutro Abs: 3.5 10*3/uL (ref 1.7–7.7)
Neutrophils Relative %: 53 %
Platelets: 243 10*3/uL (ref 150–400)
RBC: 3.95 MIL/uL — ABNORMAL LOW (ref 4.22–5.81)
RDW: 14.3 % (ref 11.5–15.5)
WBC: 6.6 10*3/uL (ref 4.0–10.5)

## 2016-06-15 LAB — BASIC METABOLIC PANEL
Anion gap: 10 (ref 5–15)
BUN: 9 mg/dL (ref 6–20)
CO2: 21 mmol/L — ABNORMAL LOW (ref 22–32)
Calcium: 9.5 mg/dL (ref 8.9–10.3)
Chloride: 99 mmol/L — ABNORMAL LOW (ref 101–111)
Creatinine, Ser: 1.58 mg/dL — ABNORMAL HIGH (ref 0.61–1.24)
GFR calc Af Amer: 51 mL/min — ABNORMAL LOW (ref >60)
GFR calc non Af Amer: 44 mL/min — ABNORMAL LOW (ref >60)
Glucose, Bld: 137 mg/dL — ABNORMAL HIGH (ref 65–99)
Potassium: 3.7 mmol/L (ref 3.5–5.1)
Sodium: 130 mmol/L — ABNORMAL LOW (ref 135–145)

## 2016-06-15 LAB — TROPONIN I: Troponin I: <0.03 ng/mL (ref <0.03)

## 2016-06-15 LAB — BRAIN NATRIURETIC PEPTIDE: B Natriuretic Peptide: 14 pg/mL (ref 0.0–100.0)

## 2016-06-15 MED ORDER — ALBUTEROL SULFATE (2.5 MG/3ML) 0.083% IN NEBU: 5.0000 mg | INHALATION_SOLUTION | Freq: Once | RESPIRATORY_TRACT | Status: DC

## 2016-06-15 MED ORDER — IPRATROPIUM BROMIDE 0.02 % IN SOLN: 0.5000 mg | Freq: Once | RESPIRATORY_TRACT | Status: DC

## 2016-06-15 MED ORDER — METHYLPREDNISOLONE SODIUM SUCC 125 MG IJ SOLR
125.0000 mg | Freq: Once | INTRAMUSCULAR | Status: AC
  Administered 2016-06-15: 125 mg via INTRAVENOUS
  Filled 2016-06-15: qty 2

## 2016-06-15 MED ORDER — ALBUTEROL SULFATE (2.5 MG/3ML) 0.083% IN NEBU
2.5000 mg | INHALATION_SOLUTION | Freq: Once | RESPIRATORY_TRACT | Status: AC
  Administered 2016-06-15: 2.5 mg via RESPIRATORY_TRACT
  Filled 2016-06-15: qty 3

## 2016-06-15 MED ORDER — IPRATROPIUM-ALBUTEROL 0.5-2.5 (3) MG/3ML IN SOLN
3.0000 mL | Freq: Once | RESPIRATORY_TRACT | Status: AC
  Administered 2016-06-15: 3 mL via RESPIRATORY_TRACT
  Filled 2016-06-15: qty 3

## 2016-06-15 MED ORDER — PREDNISONE 10 MG (21) PO TBPK: ORAL_TABLET | ORAL | Status: DC

## 2016-06-15 NOTE — ED Provider Notes (Signed)
CSN: [id number]     Arrival date & time 06/15/16  0806 History  By signing my name below, I, Higinio Plan, attest that this documentation has been prepared under the direction and in the presence of Dorie Rank, MD . Electronically Signed: Higinio Plan, Scribe. 06/15/2016. 9:42 AM.  Chief Complaint  Patient presents with  . Shortness of Breath   Patient is a 65 y.o. male presenting with shortness of breath. The history is provided by the patient. No language interpreter was used.  Shortness of Breath Severity:  Mild Onset quality:  Gradual Timing:  Constant Progression:  Improving Chronicity:  Chronic Context: smoke exposure   Relieved by:  Oxygen Ineffective treatments:  None tried Associated symptoms: no chest pain, no cough and no fever    HPI Comments:  Matthew Molina is a 65 y.o. male with PMHx of DM, HTN, acute respiratory failure, and COPD, who presents to the Emergency Department via EMS complaining of gradually improving, shortness of breath that began this morning. Pt reports he was given a breathing treatment en route to ED in EMS that has improved his symptoms. Pt states his shortness of breath began when he was lying down at home and worsened after he got up and smoked a couple cigarettes. Pt states he uses an inhaler daily and is on Oxygen only at nighttime. He denies fever, chest pain, productive cough, and any other complaints.   Past Medical History  Diagnosis Date  . Nicotine addiction   . Schizophrenia ([place])   . Hyperlipidemia   . Obesity   . Diabetes mellitus   . Arthritis   . Acute respiratory failure with hypoxia ([place])   . Acute kidney injury ([place])   . Multiple lung nodules on CT 04/02/2015  . Depression   . COPD (chronic obstructive pulmonary disease) ([place])   . Diabetes mellitus without complication ([place])   . Hypertension   . Hypercholesterolemia    Past Surgical History  Procedure Laterality Date  . Colonoscopy N/A 04/01/2013    Procedure: COLONOSCOPY;   Surgeon: Danie Binder, MD;  Location: [facility];  Service: Endoscopy;  Laterality: N/A;  10:00 AM-moved to [place] notified pt  . Cataract extraction w/phaco Left 11/20/2013    Procedure: CATARACT EXTRACTION PHACO AND INTRAOCULAR LENS PLACEMENT (IOC);  Surgeon: Tonny Branch, MD;  Location: [facility] ORS;  Service: Ophthalmology;  Laterality: Left;  CDE:10.26  . Cataract extraction w/phaco Right 12/08/2013    Procedure: RIGHT EYE CATARACT EXTRACTION PHACO AND INTRAOCULAR LENS PLACEMENT ;  Surgeon: Tonny Branch, MD;  Location: [facility] ORS;  Service: Ophthalmology;  Laterality: Right;  CDE 12.38  . Eye surgery Left 11/2013    cataract extraction  . Shoulder surgery    . Colonoscopy  2014    INCOMPLETE PREP IN R COLON  . Esophagogastroduodenoscopy N/A 02/18/2016    Procedure: ESOPHAGOGASTRODUODENOSCOPY (EGD);  Surgeon: Danie Binder, MD;  Location: [facility];  Service: Endoscopy;  Laterality: N/A;  1145  . Savory dilation N/A 02/18/2016    Procedure: SAVORY DILATION;  Surgeon: Danie Binder, MD;  Location: [facility];  Service: Endoscopy;  Laterality: N/A;   Family History  Problem Relation Age of Onset  . Diabetes Mother   . Hypertension Mother   . Stroke Mother   . Diabetes Sister   . Heart disease Sister   . Kidney disease Father   . Colon cancer Neg Hx   . Colon polyps Neg Hx    Social  History  Substance Use Topics  . Smoking status: Current Every Day Smoker -- 1.00 packs/day    Types: Cigarettes  . Smokeless tobacco: None     Comment: quit smoking before he went to the hospital   . Alcohol Use: No    Review of Systems  Constitutional: Negative for fever.  Respiratory: Positive for shortness of breath. Negative for cough.   Cardiovascular: Negative for chest pain.   Allergies  Sertraline hcl and Wellbutrin  Home Medications   Prior to Admission medications   Medication Sig Start Date End Date Taking? Authorizing Provider  ACCU-CHEK AVIVA PLUS test strip TEST BLOOD SUGAR  ONCE DAILY AS DIRECTED. 02/24/16  Yes Fayrene Helper, MD  acetaminophen (TYLENOL) 500 MG tablet Take 500 mg by mouth daily as needed.   Yes Historical Provider, MD  albuterol (PROVENTIL HFA;VENTOLIN HFA) 108 (90 BASE) MCG/ACT inhaler Inhale 2 puffs into the lungs every 6 (six) hours as needed for wheezing or shortness of breath. 07/29/15  Yes Ezequiel Essex, MD  amLODipine (NORVASC) 5 MG tablet TAKE (1) TABLET BY MOUTH DAILY. 05/05/16  Yes Fayrene Helper, MD  aspirin EC 81 MG tablet Take 81 mg by mouth daily.   Yes Historical Provider, MD  budesonide-formoterol (SYMBICORT) 80-4.5 MCG/ACT inhaler INHALE 2 PUFFS INTO LUNGS TWICE DAILY. 02/22/16  Yes Fayrene Helper, MD  busPIRone (BUSPAR) 7.5 MG tablet One tablet twice daily 05/04/16  Yes Fayrene Helper, MD  glipiZIDE (GLUCOTROL XL) 10 MG 24 hr tablet Take 1 tablet (10 mg total) by mouth daily. 01/05/16  Yes Fayrene Helper, MD  ipratropium-albuterol (DUONEB) 0.5-2.5 (3) MG/3ML SOLN INHALE 1 VIAL VIA NEBULIZER EVERY FOUR HOURS AS NEEDED. 03/02/16  Yes Fayrene Helper, MD  metFORMIN (GLUCOPHAGE) 1000 MG tablet TAKE 1 TABLET BY MOUTH TWICE DAILY WITH FOOD FOR DIABETES. 12/23/15  Yes Fayrene Helper, MD  omeprazole (PRILOSEC) 20 MG capsule 1 PO 30 mins prior to breakfast and supper 02/18/16  Yes Danie Binder, MD  polyethylene glycol powder (GLYCOLAX/MIRALAX) powder MIX 1 CAPFUL (17G) IN 8 OUNCES OF JUICE/WATER AND DRINK ONCE DAILY. 12/23/15  Yes Fayrene Helper, MD  pravastatin (PRAVACHOL) 40 MG tablet TAKE (1) TABLET BY MOUTH AT BEDTIME FOR CHOLESTEROL. 01/05/16  Yes Fayrene Helper, MD  risperiDONE (RISPERDAL) 1 MG tablet Take 1 tablet (1 mg total) by mouth at bedtime. Takes 5mg  total every day at bedtime 03/23/16  Yes Fayrene Helper, MD  risperidone (RISPERDAL) 4 MG tablet Take 1 tablet (4 mg total) by mouth at bedtime. 03/23/16  Yes Fayrene Helper, MD  SPIRIVA HANDIHALER 18 MCG inhalation capsule INHALE 1 CAPSULE AS DIRECTED ONCE A  DAY. 02/22/15  Yes Fayrene Helper, MD  traZODone (DESYREL) 50 MG tablet TAKE ONE TABLET BY MOUTH AT BEDTIME AS NEEDED FOR SLEEP. 05/18/16  Yes Fayrene Helper, MD  triamterene-hydrochlorothiazide (MAXZIDE-25) 37.5-25 MG tablet Take 1 tablet by mouth daily. 01/05/16  Yes Fayrene Helper, MD  valsartan (DIOVAN) 320 MG tablet Take 1 tablet (320 mg total) by mouth daily. 01/05/16  Yes Fayrene Helper, MD  predniSONE (STERAPRED UNI-PAK 21 TAB) 10 MG (21) TBPK tablet Take 6 tabs by mouth daily  for 2 days, then 5 tabs for 2 days, then 4 tabs for 2 days, then 3 tabs for 2 days, 2 tabs for 2 days, then 1 tab by mouth daily for 2 days 06/15/16   Dorie Rank, MD   BP 104/65 mmHg  Pulse  111  Temp(Src) 97.7 F (36.5 C) (Oral)  Resp 15  Ht 6\' 1"  (1.854 m)  Wt 108.863 kg  BMI 31.67 kg/m2  SpO2 93% Physical Exam  Constitutional: He appears well-developed and well-nourished. No distress.  HENT:  Head: Normocephalic and atraumatic.  Right Ear: External ear normal.  Left Ear: External ear normal.  Eyes: Conjunctivae are normal. Right eye exhibits no discharge. Left eye exhibits no discharge. No scleral icterus.  Neck: Neck supple. No tracheal deviation present.  Cardiovascular: Regular rhythm and intact distal pulses.  Tachycardia present.   Pulmonary/Chest: Effort normal. No accessory muscle usage or stridor. No respiratory distress. He has wheezes. He has no rales.  Speaking in full sentences easily without any distress.  Abdominal: Soft. Bowel sounds are normal. He exhibits no distension. There is no tenderness. There is no rebound and no guarding.  Musculoskeletal: He exhibits no edema or tenderness.  Neurological: He is alert. He has normal strength. No cranial nerve deficit (no facial droop, extraocular movements intact, no slurred speech) or sensory deficit. He exhibits normal muscle tone. He displays no seizure activity. Coordination normal.  Skin: Skin is warm and dry. No rash noted.   Psychiatric: He has a normal mood and affect.  Nursing note and vitals reviewed.  ED Course  Procedures  DIAGNOSTIC STUDIES:  Oxygen Saturation is 99% on RA, normal by my interpretation.    COORDINATION OF CARE:  9:17 AM Discussed treatment plan with pt at bedside and pt agreed to plan. Medications given in the ED Medications  methylPREDNISolone sodium succinate (SOLU-MEDROL) 125 mg/2 mL injection 125 mg (125 mg Intravenous Given 06/15/16 0935)  ipratropium-albuterol (DUONEB) 0.5-2.5 (3) MG/3ML nebulizer solution 3 mL (3 mLs Nebulization Given 06/15/16 0940)  albuterol (PROVENTIL) (2.5 MG/3ML) 0.083% nebulizer solution 2.5 mg (2.5 mg Nebulization Given 06/15/16 0940)     Labs Review Labs Reviewed  CBC WITH DIFFERENTIAL/PLATELET - Abnormal; Notable for the following:    RBC 3.95 (*)    Hemoglobin 11.4 (*)    HCT 34.6 (*)    All other components within normal limits  BASIC METABOLIC PANEL - Abnormal; Notable for the following:    Sodium 130 (*)    Chloride 99 (*)    CO2 21 (*)    Glucose, Bld 137 (*)    Creatinine, Ser 1.58 (*)    GFR calc non Af Amer 44 (*)    GFR calc Af Amer 51 (*)    All other components within normal limits  TROPONIN I  BRAIN NATRIURETIC PEPTIDE    Imaging Review Dg Chest Portable 1 View  06/15/2016  CLINICAL DATA:  Cough and shortness of Breath starting this morning. Smoker EXAM: PORTABLE CHEST 1 VIEW COMPARISON:  05/22/2016 FINDINGS: Cardiomediastinal silhouette is stable. No acute infiltrate or pleural effusion. No pulmonary edema. Bony thorax is stable. IMPRESSION: No active disease. Electronically Signed   By: Lahoma Crocker M.D.   On: 06/15/2016 08:45   I have personally reviewed and evaluated these images and lab results as part of my medical decision-making.   EKG Interpretation   Date/Time:  Thursday June 15 2016 08:17:09 EDT Ventricular Rate:  113 PR Interval:    QRS Duration: 94 QT Interval:  330 QTC Calculation: 455 R Axis:   -69 Text  Interpretation:  Sinus tachycardia Left anterior fascicular block Low  voltage, precordial leads Abnormal R-wave progression, early transition  Consider anterior infarct No significant change since last tracing  Confirmed by Lizabeth Fellner  MD-J, Trevell Pariseau [id number]) on  06/15/2016 8:24:58 AM      MDM   Final diagnoses:  COPD with acute exacerbation ([place])    The patient's chest x-ray does not show pneumonia. EKG does not suggest any ischemic changes. Laboratory tests are not significantly changed from baseline.Patient's symptoms improved with albuterol Atrovent and steroids. I suspect he has had a COPD exacerbation. Patient counseled on smoking cessation. Plan on discharge home with a steroid taper. Patient is feeling well and wants to go home.   I personally performed the services described in this documentation, which was scribed in my presence.  The recorded information has been reviewed and is accurate.   Dorie Rank, MD 06/15/16 1100

## 2016-06-15 NOTE — Discharge Instructions (Signed)
Chronic Obstructive Pulmonary Disease Chronic obstructive pulmonary disease (COPD) is a common lung condition in which airflow from the lungs is limited. COPD is a general term that can be used to describe many different lung problems that limit airflow, including both chronic bronchitis and emphysema. If you have COPD, your lung function will probably never return to normal, but there are measures you can take to improve lung function and make yourself feel better. CAUSES   Smoking (common).  Exposure to secondhand smoke.  Genetic problems.  Chronic inflammatory lung diseases or recurrent infections. SYMPTOMS  Shortness of breath, especially with physical activity.  Deep, persistent (chronic) cough with a large amount of thick mucus.  Wheezing.  Rapid breaths (tachypnea).  Gray or bluish discoloration (cyanosis) of the skin, especially in your fingers, toes, or lips.  Fatigue.  Weight loss.  Frequent infections or episodes when breathing symptoms become much worse (exacerbations).  Chest tightness. DIAGNOSIS Your health care provider will take a medical history and perform a physical examination to diagnose COPD. Additional tests for COPD may include:  Lung (pulmonary) function tests.  Chest X-ray.  CT scan.  Blood tests. TREATMENT  Treatment for COPD may include:  Inhaler and nebulizer medicines. These help manage the symptoms of COPD and make your breathing more comfortable.  Supplemental oxygen. Supplemental oxygen is only helpful if you have a low oxygen level in your blood.  Exercise and physical activity. These are beneficial for nearly all people with COPD.  Lung surgery or transplant.  Nutrition therapy to gain weight, if you are underweight.  Pulmonary rehabilitation. This may involve working with a team of health care providers and specialists, such as respiratory, occupational, and physical therapists. HOME CARE INSTRUCTIONS  Take all medicines  (inhaled or pills) as directed by your health care provider.  Avoid over-the-counter medicines or cough syrups that dry up your airway (such as antihistamines) and slow down the elimination of secretions unless instructed otherwise by your health care provider.  If you are a smoker, the most important thing that you can do is stop smoking. Continuing to smoke will cause further lung damage and breathing trouble. Ask your health care provider for help with quitting smoking. He or she can direct you to community resources or hospitals that provide support.  Avoid exposure to irritants such as smoke, chemicals, and fumes that aggravate your breathing.  Use oxygen therapy and pulmonary rehabilitation if directed by your health care provider. If you require home oxygen therapy, ask your health care provider whether you should purchase a pulse oximeter to measure your oxygen level at home.  Avoid contact with individuals who have a contagious illness.  Avoid extreme temperature and humidity changes.  Eat healthy foods. Eating smaller, more frequent meals and resting before meals may help you maintain your strength.  Stay active, but balance activity with periods of rest. Exercise and physical activity will help you maintain your ability to do things you want to do.  Preventing infection and hospitalization is very important when you have COPD. Make sure to receive all the vaccines your health care provider recommends, especially the pneumococcal and influenza vaccines. Ask your health care provider whether you need a pneumonia vaccine.  Learn and use relaxation techniques to manage stress.  Learn and use controlled breathing techniques as directed by your health care provider. Controlled breathing techniques include:  Pursed lip breathing. Start by breathing in (inhaling) through your nose for 1 second. Then, purse your lips as if you were   going to whistle and breathe out (exhale) through the  pursed lips for 2 seconds.  Diaphragmatic breathing. Start by putting one hand on your abdomen just above your waist. Inhale slowly through your nose. The hand on your abdomen should move out. Then purse your lips and exhale slowly. You should be able to feel the hand on your abdomen moving in as you exhale.  Learn and use controlled coughing to clear mucus from your lungs. Controlled coughing is a series of short, progressive coughs. The steps of controlled coughing are: 1. Lean your head slightly forward. 2. Breathe in deeply using diaphragmatic breathing. 3. Try to hold your breath for 3 seconds. 4. Keep your mouth slightly open while coughing twice. 5. Spit any mucus out into a tissue. 6. Rest and repeat the steps once or twice as needed. SEEK MEDICAL CARE IF:  You are coughing up more mucus than usual.  There is a change in the color or thickness of your mucus.  Your breathing is more labored than usual.  Your breathing is faster than usual. SEEK IMMEDIATE MEDICAL CARE IF:  You have shortness of breath while you are resting.  You have shortness of breath that prevents you from:  Being able to talk.  Performing your usual physical activities.  You have chest pain lasting longer than 5 minutes.  Your skin color is more cyanotic than usual.  You measure low oxygen saturations for longer than 5 minutes with a pulse oximeter. MAKE SURE YOU:  Understand these instructions.  Will watch your condition.  Will get help right away if you are not doing well or get worse.   This information is not intended to replace advice given to you by your health care provider. Make sure you discuss any questions you have with your health care provider.   Document Released: 09/13/2005 Document Revised: 12/25/2014 Document Reviewed: 07/31/2013 Elsevier Interactive Patient Education 2016 [employer]  

## 2016-06-15 NOTE — ED Notes (Signed)
EMS reports was called out for SOB.  Reports started after pt smoked this morning.  Reports wheezing and diminished breath sounds.  Room air 02 sat was 92%, ems administered breathing treatment and sat increased to 96% on treatment.  Reports hr 110 and bp 120/80.

## 2016-06-16 ENCOUNTER — Other Ambulatory Visit: Payer: Self-pay | Admitting: Family Medicine

## 2016-06-16 ENCOUNTER — Ambulatory Visit: Payer: Commercial Managed Care - HMO | Admitting: Podiatry

## 2016-06-22 ENCOUNTER — Other Ambulatory Visit: Payer: Self-pay | Admitting: Family Medicine

## 2016-06-22 ENCOUNTER — Ambulatory Visit: Payer: Self-pay | Admitting: *Deleted

## 2016-06-23 ENCOUNTER — Other Ambulatory Visit: Payer: Self-pay | Admitting: *Deleted

## 2016-06-23 NOTE — Patient Outreach (Signed)
Transition of care call Patient reports he is doing well, no breathing issues. He is self monitoring and trying to stay inside during middle of day due to heat and humidity affecting his breathing. Patient has not had any MD appointments since last call, denies any medication changes. His neighbor is still assisting him with medications and some of his IADLs such as meal prep and housekeeping.  Assessment: COPD: Encouraged patient to continue self monitoring and intervening for COPD yellow zone. Community resources: reminded patient of transportation benefit if needed  Plan. [facility] CM Care Plan Problem One        Most Recent Value   Care Plan Problem One  Knowledge deficit regarding COPD self-management.   Role Documenting the Problem One  Care Management [place] for Problem One  Active   THN Long Term Goal (31-90 days)  Patient will be able to discuss 3 self management strategies he uses for COPD within 90 days.   [facility]N Long Term Goal Start Date  06/05/16   Interventions for Problem One Long Term Goal  reviewed what zone patient in today, able to tell Green/Yellow. Reinforced to self monitor and report symptoms   [facility]N CM Short Term Goal #1 (0-30 days)  Patient will be able to verbalize COPD zone based on self monitoring of symptoms in the next 2 weeks   Interventions for Short Term Goal #1  reminded patient to review COPD zones to familirize himself with symptoms to assist wtih self monitoring   [facility]N CM Short Term Goal #2 (0-30 days)  Patient will report no ED visits for COPD exacerbatoin over the next 2 weeks   [facility]N CM Short Term Goal #2 Start Date  06/23/16      Will visit later in the month. Scheduled while on the phone with patient,  Patient agrees to home visit. Royetta Crochet. [name], RN, BSN, [place] [phone]

## 2016-06-30 ENCOUNTER — Other Ambulatory Visit: Payer: Self-pay | Admitting: *Deleted

## 2016-06-30 NOTE — Patient Outreach (Signed)
Call to patient for transition of care No answer, left message requesting call back. Plan to visit later in month-scheduled home visit. Matthew Molina. [name], RN, BSN, [place] [phone]

## 2016-07-10 ENCOUNTER — Other Ambulatory Visit: Payer: Self-pay | Admitting: *Deleted

## 2016-07-10 ENCOUNTER — Encounter: Payer: Self-pay | Admitting: *Deleted

## 2016-07-10 NOTE — Patient Outreach (Signed)
[facility]) Care Management   07/10/2016  Matthew Molina [birth year] [id number]  Matthew Molina is an 65 y.o. male  Subjective:  Patient reports he has been "doing ok" he states he has had a harder time breathing with the hotter temperatures. He does report a cough, states it was productive last night. He is still taking prednisone, thinks today is last day. Patient still smoking, up to 2 ppd.  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  Objective:   Patient sitting on couch, neatly groomed and dressed, home clean, smells of cigarettes.  Home care aide present for part of visit. BP 114/70 (BP Location: Right Arm, Patient Position: Sitting, Cuff Size: Normal)   Pulse (!) 112   Wt 245 lb (111.1 kg)   SpO2 94%   BMI 32.32 kg/m  Review of Systems  Constitutional: Negative.   HENT: Negative.   Eyes: Negative.   Respiratory: Positive for cough and sputum production.   Cardiovascular: Negative.   Gastrointestinal: Negative.   Genitourinary: Negative.   Musculoskeletal: Negative.   Skin: Negative.   Neurological: Negative.   Endo/Heme/Allergies: Negative.   Psychiatric/Behavioral: Negative.     Physical Exam  Constitutional: He is oriented to person, place, and time. He appears well-developed and well-nourished.  Neck: Normal range of motion.  Cardiovascular: Regular rhythm and normal heart sounds.   Respiratory: Effort normal. He has rales.  Productive cough-clear to white sputum  GI: Soft. Bowel sounds are normal.  Musculoskeletal: Normal range of motion.  Neurological: He is alert and oriented to person, place, and time.  Skin: Skin is warm and dry.    Encounter Medications:   Outpatient Encounter Prescriptions as of 07/10/2016  Medication Sig Note  . ACCU-CHEK AVIVA PLUS test strip TEST BLOOD  SUGAR ONCE DAILY AS DIRECTED.   Marland Kitchen acetaminophen (TYLENOL) 500 MG tablet Take 500 mg by mouth daily as needed.   Marland Kitchen albuterol (PROVENTIL HFA;VENTOLIN HFA) 108 (90 BASE) MCG/ACT inhaler Inhale 2 puffs into the lungs every 6 (six) hours as needed for wheezing or shortness of breath. 02/24/2016: Reports using as needed. Reports using approximately once per week  . amLODipine (NORVASC) 5 MG tablet TAKE (1) TABLET BY MOUTH DAILY.   Marland Kitchen aspirin EC 81 MG tablet Take 81 mg by mouth daily.   . busPIRone (BUSPAR) 7.5 MG tablet One tablet twice daily   . glipiZIDE (GLUCOTROL XL) 10 MG 24 hr tablet TAKE 1 TABLET BY MOUTH DAILY.   Marland Kitchen ipratropium-albuterol (DUONEB) 0.5-2.5 (3) MG/3ML SOLN INHALE 1 VIAL VIA NEBULIZER EVERY FOUR HOURS AS NEEDED. 05/25/2016: Using about 2 times a day   . ipratropium-albuterol (DUONEB) 0.5-2.5 (3) MG/3ML SOLN USE 1 VIAL(3 ML) IN NEBULIZER EVERY 4 TO 6 HOURS   . metFORMIN (GLUCOPHAGE) 1000 MG tablet TAKE 1 TABLET BY MOUTH TWICE DAILY WITH FOOD FOR DIABETES.   Marland Kitchen omeprazole (PRILOSEC) 20 MG capsule 1 PO 30 mins prior to breakfast and supper   . polyethylene glycol powder (GLYCOLAX/MIRALAX) powder MIX 1 CAPFUL (17G) IN 8 OUNCES OF JUICE/WATER AND DRINK ONCE DAILY. 06/14/2016: Uses only as needed  . pravastatin (PRAVACHOL) 40 MG tablet TAKE (1) TABLET BY MOUTH AT BEDTIME FOR CHOLESTEROL.   Marland Kitchen predniSONE (STERAPRED UNI-PAK 21 TAB) 10 MG (21) TBPK tablet Take 6 tabs by mouth daily  for  2 days, then 5 tabs for 2 days, then 4 tabs for 2 days, then 3 tabs for 2 days, 2 tabs for 2 days, then 1 tab by mouth daily for 2 days 07/10/2016: One more day  . risperiDONE (RISPERDAL) 1 MG tablet Take 1 tablet (1 mg total) by mouth at bedtime. Takes '5mg'$  total every day at bedtime   . risperidone (RISPERDAL) 4 MG tablet TAKE (1) TABLET BY MOUTH AT BEDTIME.   Marland Kitchen SPIRIVA HANDIHALER 18 MCG inhalation capsule INHALE 1 CAPSULE AS DIRECTED ONCE A DAY.   . traZODone (DESYREL) 50 MG tablet TAKE ONE TABLET BY MOUTH AT BEDTIME  AS NEEDED FOR SLEEP.   Marland Kitchen triamterene-hydrochlorothiazide (MAXZIDE-25) 37.5-25 MG tablet Take 1 tablet by mouth daily.   . valsartan (DIOVAN) 320 MG tablet TAKE ONE TABLET BY MOUTH DAILY.   . SYMBICORT 80-4.5 MCG/ACT inhaler INHALE 2 PUFFS INTO LUNGS TWICE DAILY.    No facility-administered encounter medications on file as of 07/10/2016.     Functional Status:   In your present state of health, do you have any difficulty performing the following activities: 07/10/2016 06/14/2016  Hearing? N N  Vision? Y N  Difficulty concentrating or making decisions? N N  Walking or climbing stairs? Y N  Dressing or bathing? N N  Doing errands, shopping? - Scientist, forensic and eating ? Y Y  Using the Toilet? N N  In the past six months, have you accidently leaked urine? N N  Do you have problems with loss of bowel control? N N  Managing your Medications? Y Y  Managing your Finances? N N  Housekeeping or managing your Housekeeping? [name]  Some recent data might be hidden    Fall/Depression Screening:    PHQ 2/9 Scores 06/14/2016 04/11/2016 02/28/2016 01/31/2016 04/01/2015 09/02/2013  PHQ - 2 Score - 1 0 '4 3 1  '$ PHQ- 9 Score - - - '6 7 3  '$ Exception Documentation Medical reason - - - - -    Assessment:   COPD: patient using nebs and inhalers regularly. Patient watching his symptoms and monitoring for COPD zones. Educated on oxygen use and smoking-risks. Patient aware of risks and keeps oxygen in bedroom with door closed, smokes in living room.  Diabetes: patient reports blood sugars in low 100s. 130 was last checked blood sugar. Patient missed appointment with foot MD, plans to reschedule.   Pulse: Patient heart rate elevated. Educated patient to self monitor and check pulse, he was able to demonstrate how to check. He will get batteries for BP monitor and will let MD know if his heart rate continues to be elevate-over 100.   Plan:   [facility] CM Care Plan Problem One   Flowsheet Row Most Recent Value  Care  Plan Problem One  Knowledge deficit regarding COPD self-management.  Role Documenting the Problem One  Care Management [place] for Problem One  Active  THN Long Term Goal (31-90 days)  Patient will be able to discuss 3 self management strategies he uses for COPD within 90 days.  THN Long Term Goal Start Date  06/05/16  Interventions for Problem One Long Term Goal  reviewed what zone patient in today, able to tell Green/Yellow. Reinforced to self monitor and report symptoms  [facility] CM Short Term Goal #1 (0-30 days)  Patient will be able to verbalize COPD zone based on self monitoring of symptoms in the next 2 weeks  Interventions for Short Term Goal #1  reminded  patient to review COPD zones to familirize himself with symptoms to assist wtih self monitoring  [facility] CM Short Term Goal #2 (0-30 days)  Patient will report no ED visits for COPD exacerbatoin over the next 2 weeks  [facility]N CM Short Term Goal #2 Start Date  06/23/16  [facility] CM Short Term Goal #2 Met Date  07/10/16    [facility] CM Care Plan Problem Two   Flowsheet Row Most Recent Value  [facility]N CM Short Term Goal #1 (0-30 days)  Patient will not have any COPD related hospitalizations in the next 30 days  [facility]N CM Short Term Goal #1 Start Date  06/05/16  [facility] CM Short Term Goal #1 Met Date   07/10/16     Plan to call in August and may transfer to health coach or discharge.  Patient to reschedule foot MD appointment Patient to self monitor pulse and report if continues to be over 100. RNCM will send note to MD regarding heart rate.  Royetta Crochet. [name], RN, BSN, [place] [phone]

## 2016-07-19 ENCOUNTER — Telehealth: Payer: Self-pay | Admitting: Specialist

## 2016-07-19 NOTE — Telephone Encounter (Signed)
Has a boil on his buttock between his butt checks,  has had for a couple of days he is asking how to get rid of it, please advise?

## 2016-07-20 ENCOUNTER — Encounter: Payer: Self-pay | Admitting: Family Medicine

## 2016-07-20 ENCOUNTER — Ambulatory Visit (INDEPENDENT_AMBULATORY_CARE_PROVIDER_SITE_OTHER): Payer: Commercial Managed Care - HMO | Admitting: Family Medicine

## 2016-07-20 ENCOUNTER — Encounter: Payer: Self-pay | Admitting: Gastroenterology

## 2016-07-20 VITALS — BP 118/74 | HR 96 | Resp 16 | Ht 73.0 in | Wt 247.0 lb

## 2016-07-20 DIAGNOSIS — I10 Essential (primary) hypertension: Secondary | ICD-10-CM | POA: Diagnosis not present

## 2016-07-20 DIAGNOSIS — K648 Other hemorrhoids: Secondary | ICD-10-CM | POA: Diagnosis not present

## 2016-07-20 DIAGNOSIS — E785 Hyperlipidemia, unspecified: Secondary | ICD-10-CM

## 2016-07-20 DIAGNOSIS — Z720 Tobacco use: Secondary | ICD-10-CM

## 2016-07-20 MED ORDER — HYDROCORTISONE ACE-PRAMOXINE 1-1 % RE FOAM: 1.0000 | Freq: Three times a day (TID) | RECTAL | 0 refills | Status: DC

## 2016-07-20 NOTE — Telephone Encounter (Signed)
Being seen today in office.

## 2016-07-20 NOTE — Telephone Encounter (Signed)
Pt was seen in the office today.  

## 2016-07-20 NOTE — Patient Instructions (Addendum)
F/u as before  Your hemorrhoid is out, do NOT strain at stool, use stool softeners, two every day as you should  Foam sent for use to help to shringk the hemorrhoid  You are also referred to Dr Oneida Alar   Steps to Quit Smoking  Smoking tobacco can be harmful to your health and can affect almost every organ in your body. Smoking puts you, and those around you, at risk for developing many serious chronic diseases. Quitting smoking is difficult, but it is one of the best things that you can do for your health. It is never too late to quit. WHAT ARE THE BENEFITS OF QUITTING SMOKING? When you quit smoking, you lower your risk of developing serious diseases and conditions, such as:  Lung cancer or lung disease, such as COPD.  Heart disease.  Stroke.  Heart attack.  Infertility.  Osteoporosis and bone fractures. Additionally, symptoms such as coughing, wheezing, and shortness of breath may get better when you quit. You may also find that you get sick less often because your body is stronger at fighting off colds and infections. If you are pregnant, quitting smoking can help to reduce your chances of having a baby of low birth weight. HOW DO I GET READY TO QUIT? When you decide to quit smoking, create a plan to make sure that you are successful. Before you quit:  Pick a date to quit. Set a date within the next two weeks to give you time to prepare.  Write down the reasons why you are quitting. Keep this list in places where you will see it often, such as on your bathroom mirror or in your car or wallet.  Identify the people, places, things, and activities that make you want to smoke (triggers) and avoid them. Make sure to take these actions:  Throw away all cigarettes at home, at work, and in your car.  Throw away smoking accessories, such as Scientist, research (medical).  Clean your car and make sure to empty the ashtray.  Clean your home, including curtains and carpets.  Tell your family,  friends, and coworkers that you are quitting. Support from your loved ones can make quitting easier.  Talk with your health care provider about your options for quitting smoking.  Find out what treatment options are covered by your health insurance. WHAT STRATEGIES CAN I USE TO QUIT SMOKING?  Talk with your healthcare provider about different strategies to quit smoking. Some strategies include:  Quitting smoking altogether instead of gradually lessening how much you smoke over a period of time. Research shows that quitting "cold [place]" is more successful than gradually quitting.  Attending in-person counseling to help you build problem-solving skills. You are more likely to have success in quitting if you attend several counseling sessions. Even short sessions of 10 minutes can be effective.  Finding resources and support systems that can help you to quit smoking and remain smoke-free after you quit. These resources are most helpful when you use them often. They can include:  Online chats with a Social worker.  Telephone quitlines.  Printed Furniture conservator/restorer.  Support groups or group counseling.  Text messaging programs.  Mobile phone applications.  Taking medicines to help you quit smoking. (If you are pregnant or breastfeeding, talk with your health care provider first.) Some medicines contain nicotine and some do not. Both types of medicines help with cravings, but the medicines that include nicotine help to relieve withdrawal symptoms. Your health care provider may recommend:  Nicotine  patches, gum, or lozenges.  Nicotine inhalers or sprays.  Non-nicotine medicine that is taken by mouth. Talk with your health care provider about combining strategies, such as taking medicines while you are also receiving in-person counseling. Using these two strategies together makes you more likely to succeed in quitting than if you used either strategy on its own. If you are pregnant or  breastfeeding, talk with your health care provider about finding counseling or other support strategies to quit smoking. Do not take medicine to help you quit smoking unless told to do so by your health care provider. WHAT THINGS CAN I DO TO MAKE IT EASIER TO QUIT? Quitting smoking might feel overwhelming at first, but there is a lot that you can do to make it easier. Take these important actions:  Reach out to your family and friends and ask that they support and encourage you during this time. Call telephone quitlines, reach out to support groups, or work with a counselor for support.  Ask people who smoke to avoid smoking around you.  Avoid places that trigger you to smoke, such as bars, parties, or smoke-break areas at work.  Spend time around people who do not smoke.  Lessen stress in your life, because stress can be a smoking trigger for some people. To lessen stress, try:  Exercising regularly.  Deep-breathing exercises.  Yoga.  Meditating.  Performing a body scan. This involves closing your eyes, scanning your body from head to toe, and noticing which parts of your body are particularly tense. Purposefully relax the muscles in those areas.  Download or purchase mobile phone or tablet apps (applications) that can help you stick to your quit plan by providing reminders, tips, and encouragement. There are many free apps, such as QuitGuide from the [employer] Office manager for Disease Control and Prevention). You can find other support for quitting smoking (smoking cessation) through smokefree.gov and other websites. HOW WILL I FEEL WHEN I QUIT SMOKING? Within the first 24 hours of quitting smoking, you may start to feel some withdrawal symptoms. These symptoms are usually most noticeable 2-3 days after quitting, but they usually do not last beyond 2-3 weeks. Changes or symptoms that you might experience include:  Mood swings.  Restlessness, anxiety, or irritation.  Difficulty  concentrating.  Dizziness.  Strong cravings for sugary foods in addition to nicotine.  Mild weight gain.  Constipation.  Nausea.  Coughing or a sore throat.  Changes in how your medicines work in your body.  A depressed mood.  Difficulty sleeping (insomnia). After the first 2-3 weeks of quitting, you may start to notice more positive results, such as:  Improved sense of smell and taste.  Decreased coughing and sore throat.  Slower heart rate.  Lower blood pressure.  Clearer skin.  The ability to breathe more easily.  Fewer sick days. Quitting smoking is very challenging for most people. Do not get discouraged if you are not successful the first time. Some people need to make many attempts to quit before they achieve long-term success. Do your best to stick to your quit plan, and talk with your health care provider if you have any questions or concerns.   This information is not intended to replace advice given to you by your health care provider. Make sure you discuss any questions you have with your health care provider.   Document Released: 11/28/2001 Document Revised: 04/20/2015 Document Reviewed: 04/20/2015 Elsevier Interactive Patient Education [employer].

## 2016-07-23 NOTE — Assessment & Plan Note (Signed)
Painful hemmorhoid prolapsed following straining at stool, unable to reduce in office. Topical med prescribed and refer to GI Pt education re need to use daily softeners, and attempt to have regular bM with no straining

## 2016-07-23 NOTE — Assessment & Plan Note (Signed)
Controlled, no change in medication DASH diet and commitment to daily physical activity for a minimum of 30 minutes discussed and encouraged, as a part of hypertension management. The importance of attaining a healthy weight is also discussed.  BP/Weight 07/20/2016 07/10/2016 06/15/2016 06/14/2016 05/23/2016 05/22/2016 [id number]  Systolic BP 123456 [id number] [id number] - -  Diastolic BP 74 70 67 58 82 - -  Wt. (Lbs) 247 245 240 240 - 240 248  BMI 32.59 32.32 31.67 31.67 - 31.67 32.73  Some encounter information is confidential and restricted. Go to Review Flowsheets activity to see all data.

## 2016-07-23 NOTE — Assessment & Plan Note (Signed)

## 2016-07-23 NOTE — Progress Notes (Signed)
Matthew Molina     MRN: [id number]      DOB: Sep 25, 1951   HPI Mr. Amor is here with main c/o rectal pressure , loike a boil, for past 3 days , after straining at stool. No visible bleeding from rectum or in stool. Also  for  re-evaluation of chronic medical conditions, medication management and review of any available recent lab and radiology data.  Preventive health is updated, specifically  Cancer screening and Immunization.   Questions or concerns regarding consultations or procedures which the PT has had in the interim are  Addressed.Has been in Ed for breathing difficulty since last Ov, now improved   ROS Denies recent fever or chills. Denies sinus pressure, nasal congestion, ear pain or sore throat. Denies chest congestion, productive cough or wheezing. Denies chest pains, palpitations and leg swelling Denies abdominal pain, nausea, vomiting,diarrhea or constipation.   Denies dysuria, frequency, hesitancy or incontinence. Denies uncontrolled joint pain, swelling and limitation in mobility. Denies headaches, seizures, numbness, or tingling. C/o financial difficulties causing him to be depressed, denies suicidal or homicidal ideation, no hallucinations, denies uncontrolled , anxiety or insomnia. Denies skin break down or rash.   PE  BP 118/74   Pulse 96   Resp 16   Ht 6\' 1"  (1.854 m)   Wt 247 lb (112 kg)   SpO2 95%   BMI 32.59 kg/m   Patient alert and oriented and in no cardiopulmonary distress.  HEENT: No facial asymmetry, EOMI,   oropharynx pink and moist.  Neck supple no JVD, no mass.  Chest: Clear to auscultation bilaterally.  CVS: S1, S2 no murmurs, no S3.Regular rate.  ABD: Soft non tender.  Rectal: prolapsed hemorrhoid, not reducible, no rectal blood visible Ext: No edema  MS: Adequate ROM spine, shoulders, hips and knees.  Skin: Intact, no ulcerations or rash noted.  Psych: Good eye contact, blunted  affect. Memory intact not anxious or depressed  appearing.  CNS: CN 2-12 intact, power,  normal throughout.no focal deficits noted.   Assessment & Plan  Prolapsed internal hemorrhoids Painful hemmorhoid prolapsed following straining at stool, unable to reduce in office. Topical med prescribed and refer to GI Pt education re need to use daily softeners, and attempt to have regular bM with no straining  Hypertension Controlled, no change in medication DASH diet and commitment to daily physical activity for a minimum of 30 minutes discussed and encouraged, as a part of hypertension management. The importance of attaining a healthy weight is also discussed.  BP/Weight 07/20/2016 07/10/2016 06/15/2016 06/14/2016 05/23/2016 05/22/2016 [id number]  Systolic BP 123456 [id number] 123456 [id number] - -  Diastolic BP 74 70 67 58 82 - -  Wt. (Lbs) 247 245 240 240 - 240 248  BMI 32.59 32.32 31.67 31.67 - 31.67 32.73  Some encounter information is confidential and restricted. Go to Review Flowsheets activity to see all data.       Tobacco abuse Patient counseled for approximately 5 minutes regarding the health risks of ongoing nicotine use, specifically all types of cancer, heart disease, stroke and respiratory failure. The options available for help with cessation ,the behavioral changes to assist the process, and the option to either gradully reduce usage  Or abruptly stop.is also discussed. Pt is also encouraged to set specific goals in number of cigarettes used daily, as well as to set a quit date.     Essential hypertension Controlled, no change in medication DASH diet and commitment to daily physical activity for  a minimum of 30 minutes discussed and encouraged, as a part of hypertension management. The importance of attaining a healthy weight is also discussed.  BP/Weight 07/20/2016 07/10/2016 06/15/2016 06/14/2016 05/23/2016 05/22/2016 [id number]  Systolic BP 123456 [id number] 123456 [id number] - -  Diastolic BP 74 70 67 58 82 - -  Wt. (Lbs) 247 245 240 240 - 240 248  BMI  32.59 32.32 31.67 31.67 - 31.67 32.73  Some encounter information is confidential and restricted. Go to Review Flowsheets activity to see all data.       Hyperlipidemia Hyperlipidemia:Low fat diet discussed and encouraged.   Lipid Panel  Lab Results  Component Value Date   CHOL 129 01/05/2016   HDL 39 (L) 01/05/2016   LDLCALC 63 01/05/2016   TRIG 136 01/05/2016   CHOLHDL 3.3 01/05/2016   Needs to increase exercise , no med change

## 2016-07-23 NOTE — Assessment & Plan Note (Signed)
Hyperlipidemia:Low fat diet discussed and encouraged.   Lipid Panel  Lab Results  Component Value Date   CHOL 129 01/05/2016   HDL 39 (L) 01/05/2016   LDLCALC 63 01/05/2016   TRIG 136 01/05/2016   CHOLHDL 3.3 01/05/2016   Needs to increase exercise , no med change

## 2016-07-24 ENCOUNTER — Other Ambulatory Visit: Payer: Self-pay | Admitting: Family Medicine

## 2016-07-25 ENCOUNTER — Other Ambulatory Visit: Payer: Self-pay | Admitting: Family Medicine

## 2016-08-03 ENCOUNTER — Other Ambulatory Visit: Payer: Self-pay | Admitting: *Deleted

## 2016-08-03 ENCOUNTER — Encounter: Payer: Self-pay | Admitting: *Deleted

## 2016-08-03 NOTE — Patient Outreach (Signed)
Call to patient for possible case closure. Patient reports he is "all right". He denies any new needs or that he has any new questions. RNCM reviewed Health Coach program and patient does not wish to participate at this time and requests that his case be closed. Patient agrees to call [facility] for future concerns or needs. Plan to send MD case closure letter. Plan to close case per protocol. Royetta Crochet. [name], RN, BSN, [place] [phone]

## 2016-08-18 ENCOUNTER — Other Ambulatory Visit: Payer: Self-pay | Admitting: Family Medicine

## 2016-08-24 ENCOUNTER — Other Ambulatory Visit: Payer: Self-pay | Admitting: Gastroenterology

## 2016-08-24 ENCOUNTER — Ambulatory Visit (INDEPENDENT_AMBULATORY_CARE_PROVIDER_SITE_OTHER): Payer: Commercial Managed Care - HMO | Admitting: Gastroenterology

## 2016-08-24 ENCOUNTER — Other Ambulatory Visit: Payer: Self-pay

## 2016-08-24 ENCOUNTER — Encounter: Payer: Self-pay | Admitting: Gastroenterology

## 2016-08-24 DIAGNOSIS — K219 Gastro-esophageal reflux disease without esophagitis: Secondary | ICD-10-CM | POA: Diagnosis not present

## 2016-08-24 DIAGNOSIS — R1310 Dysphagia, unspecified: Secondary | ICD-10-CM | POA: Diagnosis not present

## 2016-08-24 DIAGNOSIS — B9681 Helicobacter pylori [H. pylori] as the cause of diseases classified elsewhere: Secondary | ICD-10-CM

## 2016-08-24 DIAGNOSIS — K2970 Gastritis, unspecified, without bleeding: Secondary | ICD-10-CM

## 2016-08-24 DIAGNOSIS — K645 Perianal venous thrombosis: Secondary | ICD-10-CM | POA: Diagnosis not present

## 2016-08-24 NOTE — Assessment & Plan Note (Signed)
SYMPTOMS FAIRLY WELL CONTROLLED. CONTINUE TO SMOKE 2 PK CIGS/DAY.  CONTINUE OMEPRAZOLE.  TAKE 30 MINUTES PRIOR TO YOUR MEALS TWICE DAILY. AVOID TRIGGERS FOLLOW UP IN 6 MOS.

## 2016-08-24 NOTE — Assessment & Plan Note (Signed)
SYMPTOMS CONTROLLED/RESOLVED. CHEST PAIN AFTER EGD/DIL.  CONTINUE TO MONITOR SYMPTOMS.

## 2016-08-24 NOTE — Patient Instructions (Addendum)
COMPLETE BREATH TEST TO MAKE SURE THE H PYLORI BACTERIA IS GONE.   DRINK WATER TO KEEP YOUR URINE LIGHT YELLOW.  FOLLOW A HIGH FIBER/diabetic DIET. SEE INFO BELOW on a high fiber diet.  Continue stool softener.  CONTINUE MIRALAX IF NEEDED TO AVOID CONSTIPATION.  PLEASE CALL IF YOU FEEL YOU WANT THE HEMORRHOID REMOVED.  FOLLOW UP IN 6 MOS.

## 2016-08-24 NOTE — Progress Notes (Signed)
ON RECALL  °

## 2016-08-24 NOTE — Progress Notes (Signed)
CC'ED TO PCP 

## 2016-08-24 NOTE — Assessment & Plan Note (Addendum)
COMPLETED TREATMENT. NO SIDE EFFECTS.  COMPLETE BREATH TEST TO MAKE SURE THE H PYLORI GASTRITIS IS ERADICATED.

## 2016-08-24 NOTE — Assessment & Plan Note (Signed)
SYMPTOMS CONTROLLED/RESOLVED PROMINENECE PERSISTS.  CALL IF WANT SURGERY TO REMOVE HEMORRHOID. DRINK WATER EAT FIOBER AVOID CONSTIPATION. CONTINUE STOOL SOFTENER. CONTINUE MIRALAX IF NEEDED TO AVOID CONSTIPATION. OPV IN 6 MOS

## 2016-08-24 NOTE — Progress Notes (Signed)
Subjective:    Patient ID: Matthew Molina, male    DOB: [birth year], 65 y.o.   MRN: [id number]  Matthew Nakayama, MD  HPI Has a possibly a boil on his left buttocks. WAS HURTING FOR ABOUT 2 WEEKS NOW IT'S JUST A HARD PLACE. HAD CHILLS LAST NIGHT. BMS: PRETTY REGULAR. LAST EGD MAR 2017. NO R TROUBLE TAKING ABX AND TOOK THEM ALL. HAD CHEST PAIN AFTER EGD(LASTED OFF AND ON). TAKING MED AND SMOKES: 2 PK/DAY. SOB WHEN HE WALKS AND SOMETIMES WHEN SITTING STILL-NO WORSE THAN USUAL. NO RECTAL BLEEDING, PRESSURE, PAIN, ITCHING, OR BURNING. LAST USED CREAM AND NOT REALLY NEEDED IT.   PT DENIES FEVER, CHILLS, HEMATOCHEZIA, nausea, vomiting, melena, diarrhea, CHANGE IN BOWEL IN HABITS, constipation, abdominal pain, problems swallowing, problems with sedation, OR heartburn or indigestion.  Past Medical History:  Diagnosis Date  . Acute kidney injury ([place])   . Acute respiratory failure with hypoxia ([place])   . Arthritis   . COPD (chronic obstructive pulmonary disease) ([place])   . Depression   . Diabetes mellitus   . Diabetes mellitus without complication ([place])   . Hypercholesterolemia   . Hyperlipidemia   . Hypertension   . Multiple lung nodules on CT 04/02/2015  . Nicotine addiction   . Obesity   . Oxygen deficiency    qhs  . Schizophrenia [facility])     Past Surgical History:  Procedure Laterality Date  . CATARACT EXTRACTION W/PHACO Left 11/20/2013   Procedure: CATARACT EXTRACTION PHACO AND INTRAOCULAR LENS PLACEMENT (IOC);  Surgeon: Tonny Branch, MD;  Location: [facility] ORS;  Service: Ophthalmology;  Laterality: Left;  CDE:10.26  . CATARACT EXTRACTION W/PHACO Right 12/08/2013   Procedure: RIGHT EYE CATARACT EXTRACTION PHACO AND INTRAOCULAR LENS PLACEMENT ;  Surgeon: Tonny Branch, MD;  Location: [facility];  Service: Ophthalmology;  Laterality: Right;  CDE 12.38  . COLONOSCOPY N/A 04/01/2013   Procedure: COLONOSCOPY;  Surgeon: Danie Binder, MD;  Location: [facility] SU[facility];  Service: Endoscopy;  Laterality: N/A;   10:00 AM-moved to [place] notified pt  . COLONOSCOPY  2014   INCOMPLETE PREP IN R COLON  . ESOPHAGOGASTRODUODENOSCOPY N/A 02/18/2016   Procedure: ESOPHAGOGASTRODUODENOSCOPY (EGD);  Surgeon: Danie Binder, MD;  Location: [facility];  Service: Endoscopy;  Laterality: N/A;  1145  . EYE SURGERY Left 11/2013   cataract extraction  . SAVORY DILATION N/A 02/18/2016   Procedure: SAVORY DILATION;  Surgeon: Danie Binder, MD;  Location: [facility];  Service: Endoscopy;  Laterality: N/A;  . SHOULDER SURGERY      Allergies  Allergen Reactions  . Sertraline Hcl     Stomach upset/pain  . Wellbutrin [Bupropion] Other (See Comments)    Makes stomach hurt    Current Outpatient Prescriptions  Medication Sig Dispense Refill  .      .      . acetaminophen (TYLENOL) 500 MG tablet Take 500 mg by mouth daily as needed.    Marland Kitchen albuterol inhaler Inhale every 6 (six) hours as needed for wheezing or shortness of breath.    Marland Kitchen amLODipine (NORVASC) 5 MG tablet TAKE (1) TABLET BY MOUTH DAILY.    Marland Kitchen aspirin EC 81 MG tablet Take 81 mg by mouth daily.    . busPIRone (BUSPAR) 7.5 MG tablet One tablet twice daily    . GLIPIZIDE XL 10 MG 24 hr tablet TAKE 1 TABLET BY MOUTH DAILY.    . STOOL SOFTENER BID    .      Marland Kitchen  ipratropium-albuterol MG/3ML SOLN USE Q EVERY 4 TO 6 HOURS    . GLUCOPHAGE 1000 MG tablet TAKE TWICE DAILY     . omeprazole (PRILOSEC) 20 MG capsule 1 PO 30 mins prior to breakfast and supper    .  GLYCOLAX/MIRALAX powder MIX 1 CAPFUL (17G) IN 8 OUNCES OF JUICE/WATER AND DRINK ONCE DAILY. NOT TAKING   . pravastatin (PRAVACHOL) 40 MG tablet TAKE (1) TABLET BY MOUTH AT BEDTIME FOR CHOLESTEROL.    .      .      . risperidone (RISPERDAL) 4 MG tablet TAKE (1) TABLET BY MOUTH AT BEDTIME.    .      Dellis Anes 80-4.5 MCG/ACT inhaler INHALE 2 PUFFS INTO LUNGS TWICE DAILY.    . traZODone (DESYREL) 50 MG tablet TAKE QHS BEDTIME     .  MAXZIDE-25 37.5-25 MG tablet Take 1 tablet by mouth daily.    Marland Kitchen  DIOVAN 320 MG tablet TAKE ONE TABLET BY MOUTH DAILY.     Review of Systems PER HPI OTHERWISE ALL SYSTEMS ARE NEGATIVE.    Objective:   Physical Exam  Constitutional: He is oriented to person, place, and time. He appears well-developed and well-nourished. No distress.  HENT:  Head: Normocephalic and atraumatic.  Mouth/Throat: Oropharynx is clear and moist. No oropharyngeal exudate.  Eyes: Pupils are equal, round, and reactive to light. No scleral icterus.  Neck: Normal range of motion. Neck supple.  Cardiovascular: Normal rate, regular rhythm and normal heart sounds.   Pulmonary/Chest: Effort normal and breath sounds normal. No respiratory distress.  Abdominal: Soft. Bowel sounds are normal. He exhibits no distension. There is no tenderness.  Genitourinary: Rectal exam shows external hemorrhoid. Rectal exam shows no internal hemorrhoid and no tenderness.     Musculoskeletal: He exhibits no edema.  Lymphadenopathy:    He has no cervical adenopathy.  Neurological: He is alert and oriented to person, place, and time.  Psychiatric: He has a normal mood and affect.  Vitals reviewed.     Assessment & Plan:

## 2016-08-25 ENCOUNTER — Telehealth: Payer: Self-pay

## 2016-08-25 MED ORDER — PREDNISONE 5 MG (21) PO TBPK: ORAL_TABLET | ORAL | 0 refills | Status: DC

## 2016-08-25 NOTE — Telephone Encounter (Signed)
Noted, thanks!

## 2016-08-25 NOTE — Telephone Encounter (Signed)
Verbal order received for Prednisone dose pak .   Medication sent to pharmacy and patient aware.

## 2016-09-06 ENCOUNTER — Other Ambulatory Visit: Payer: Self-pay | Admitting: Family Medicine

## 2016-09-12 ENCOUNTER — Other Ambulatory Visit: Payer: Self-pay | Admitting: Family Medicine

## 2016-09-14 ENCOUNTER — Other Ambulatory Visit: Payer: Self-pay | Admitting: Family Medicine

## 2016-09-26 LAB — H. PYLORI BREATH TEST: H. pylori Breath Test: DETECTED — AB

## 2016-09-27 ENCOUNTER — Encounter: Payer: Self-pay | Admitting: Family Medicine

## 2016-09-27 ENCOUNTER — Ambulatory Visit (INDEPENDENT_AMBULATORY_CARE_PROVIDER_SITE_OTHER): Payer: Commercial Managed Care - HMO | Admitting: Family Medicine

## 2016-09-27 VITALS — BP 102/64 | HR 115 | Ht 73.0 in | Wt 235.1 lb

## 2016-09-27 DIAGNOSIS — I10 Essential (primary) hypertension: Secondary | ICD-10-CM

## 2016-09-27 DIAGNOSIS — Z0000 Encounter for general adult medical examination without abnormal findings: Secondary | ICD-10-CM | POA: Diagnosis not present

## 2016-09-27 DIAGNOSIS — E669 Obesity, unspecified: Secondary | ICD-10-CM

## 2016-09-27 DIAGNOSIS — E1169 Type 2 diabetes mellitus with other specified complication: Secondary | ICD-10-CM

## 2016-09-27 DIAGNOSIS — Z23 Encounter for immunization: Secondary | ICD-10-CM

## 2016-09-27 DIAGNOSIS — B351 Tinea unguium: Secondary | ICD-10-CM

## 2016-09-27 DIAGNOSIS — J441 Chronic obstructive pulmonary disease with (acute) exacerbation: Secondary | ICD-10-CM | POA: Diagnosis not present

## 2016-09-27 DIAGNOSIS — B9681 Helicobacter pylori [H. pylori] as the cause of diseases classified elsewhere: Secondary | ICD-10-CM

## 2016-09-27 DIAGNOSIS — Z1211 Encounter for screening for malignant neoplasm of colon: Secondary | ICD-10-CM | POA: Diagnosis not present

## 2016-09-27 DIAGNOSIS — Z720 Tobacco use: Secondary | ICD-10-CM

## 2016-09-27 DIAGNOSIS — R972 Elevated prostate specific antigen [PSA]: Secondary | ICD-10-CM

## 2016-09-27 DIAGNOSIS — E785 Hyperlipidemia, unspecified: Secondary | ICD-10-CM

## 2016-09-27 DIAGNOSIS — K2970 Gastritis, unspecified, without bleeding: Secondary | ICD-10-CM

## 2016-09-27 LAB — POC HEMOCCULT BLD/STL (OFFICE/1-CARD/DIAGNOSTIC): Fecal Occult Blood, POC: NEGATIVE

## 2016-09-27 MED ORDER — METHYLPREDNISOLONE ACETATE 80 MG/ML IJ SUSP
80.0000 mg | Freq: Once | INTRAMUSCULAR | Status: AC
  Administered 2016-09-27: 80 mg via INTRAMUSCULAR

## 2016-09-27 MED ORDER — PREDNISONE 5 MG (21) PO TBPK: 5.0000 mg | ORAL_TABLET | ORAL | 0 refills | Status: DC

## 2016-09-27 NOTE — Assessment & Plan Note (Signed)
Over corrected and tachycardic, d/c amlodipine Review in 4 weeks

## 2016-09-27 NOTE — Assessment & Plan Note (Signed)
depomedrol in office 80 mg , then pred dose paxck, needs to quit nicotine

## 2016-09-27 NOTE — Assessment & Plan Note (Signed)
After obtaining informed consent, the vaccine is  administered by LPN.  

## 2016-09-27 NOTE — Patient Instructions (Addendum)
Nurse BP check in 6 weeks, MD follow up in 3 months  sTOP amlodipine , your bP is too low  Depo medrol in office for cOPD flare and 6 day course of prednisone sent  nEED to QUIT smoking!  Flu vaccine today  Rectal exam today is normal  Need chest scan to screen for lung cancer   Labs today lipid, cmp and eGFR, hBA1C  Need to call when you decide to return to urology,need this appt  If liver function test is good, I will prescribe medication for your fungal toenails

## 2016-09-27 NOTE — Assessment & Plan Note (Signed)

## 2016-09-28 LAB — COMPLETE METABOLIC PANEL WITH GFR
ALT: 28 U/L (ref 9–46)
AST: 25 U/L (ref 10–35)
Albumin: 4.2 g/dL (ref 3.6–5.1)
Alkaline Phosphatase: 47 U/L (ref 40–115)
BUN: 14 mg/dL (ref 7–25)
CO2: 20 mmol/L (ref 20–31)
Calcium: 10 mg/dL (ref 8.6–10.3)
Chloride: 96 mmol/L — ABNORMAL LOW (ref 98–110)
Creat: 2.13 mg/dL — ABNORMAL HIGH (ref 0.70–1.25)
GFR, Est African American: 36 mL/min — ABNORMAL LOW (ref >=60)
GFR, Est Non African American: 31 mL/min — ABNORMAL LOW (ref >=60)
Glucose, Bld: 81 mg/dL (ref 65–99)
Potassium: 4.2 mmol/L (ref 3.5–5.3)
Sodium: 130 mmol/L — ABNORMAL LOW (ref 135–146)
Total Bilirubin: 0.5 mg/dL (ref 0.2–1.2)
Total Protein: 6.8 g/dL (ref 6.1–8.1)

## 2016-09-28 LAB — LIPID PANEL
Cholesterol: 116 mg/dL — ABNORMAL LOW (ref 125–200)
HDL: 34 mg/dL — ABNORMAL LOW (ref >=40)
LDL Cholesterol: 48 mg/dL (ref <130)
Total CHOL/HDL Ratio: 3.4 Ratio (ref <=5.0)
Triglycerides: 168 mg/dL — ABNORMAL HIGH (ref <150)
VLDL: 34 mg/dL — ABNORMAL HIGH (ref <30)

## 2016-09-28 LAB — HEMOGLOBIN A1C
Hgb A1c MFr Bld: 5.5 % (ref <5.7)
Mean Plasma Glucose: 111 mg/dL

## 2016-09-29 ENCOUNTER — Ambulatory Visit (INDEPENDENT_AMBULATORY_CARE_PROVIDER_SITE_OTHER): Payer: Commercial Managed Care - HMO | Admitting: Podiatry

## 2016-09-29 ENCOUNTER — Encounter: Payer: Self-pay | Admitting: Podiatry

## 2016-09-29 VITALS — Ht 73.0 in | Wt 235.0 lb

## 2016-09-29 DIAGNOSIS — B351 Tinea unguium: Secondary | ICD-10-CM

## 2016-09-29 DIAGNOSIS — E119 Type 2 diabetes mellitus without complications: Secondary | ICD-10-CM | POA: Diagnosis not present

## 2016-09-29 DIAGNOSIS — M79676 Pain in unspecified toe(s): Secondary | ICD-10-CM

## 2016-09-29 DIAGNOSIS — Q828 Other specified congenital malformations of skin: Secondary | ICD-10-CM

## 2016-09-29 NOTE — Progress Notes (Signed)
Patient ID: Matthew Molina, male   DOB: [birth year], 65 y.o.   MRN: [id number] Complaint:  Visit Type: Patient returns to my office for  preventative foot care services. Complaint: Patient states" my nails have grown long and thick and become painful to walk and wear shoes" Patient has been diagnosed with DM with no foot complications. The patient presents for preventative foot care services. No changes to ROS He relates two painful callus under both feet.  Also has history of redness and swelling and stillness to right big toe which has marked;ly improved.  Podiatric Exam: Vascular: dorsalis pedis and posterior tibial pulses are palpable bilateral. Capillary return is immediate. Temperature gradient is WNL. Skin turgor WNL  Sensorium: Normal Semmes Weinstein monofilament test. Normal tactile sensation bilaterally. Nail Exam: Pt has thick disfigured discolored nails with subungual debris noted bilateral entire nail hallux through fifth toenails Ulcer Exam: There is no evidence of ulcer or pre-ulcerative changes or infection. Orthopedic Exam: Muscle tone and strength are WNL. No limitations in general ROM. No crepitus or effusions noted. Foot type and digits show no abnormalities. Bony prominences are unremarkable.Mild brownish discoloration 1st MPJ right foot. Skin:  Porokeratosis sub 5th metabase both feet.. No infection or ulcers  Diagnosis:  Onychomycosis, , Pain in right toe, pain in left toes, Porokeratosis  Treatment & Plan Procedures and Treatment: Consent by patient was obtained for treatment procedures. The patient understood the discussion of treatment and procedures well. All questions were answered thoroughly reviewed. Debridement of mycotic and hypertrophic toenails, 1 through 5 bilateral and clearing of subungual debris. No ulceration, no infection noted. Debride porokeratosis. Return Visit-Office Procedure: Patient instructed to return to the office for a follow up visit 3 months for  continued evaluation and treatment.    Gardiner Barefoot DPM

## 2016-10-01 ENCOUNTER — Other Ambulatory Visit: Payer: Self-pay | Admitting: Family Medicine

## 2016-10-01 ENCOUNTER — Encounter: Payer: Self-pay | Admitting: Family Medicine

## 2016-10-01 ENCOUNTER — Telehealth: Payer: Self-pay | Admitting: Family Medicine

## 2016-10-01 DIAGNOSIS — I10 Essential (primary) hypertension: Secondary | ICD-10-CM

## 2016-10-01 DIAGNOSIS — R972 Elevated prostate specific antigen [PSA]: Secondary | ICD-10-CM | POA: Insufficient documentation

## 2016-10-01 HISTORY — DX: Elevated prostate specific antigen (PSA): R97.20

## 2016-10-01 NOTE — Assessment & Plan Note (Signed)
Being followed by GI, still has positive breath test, will forward to GI

## 2016-10-01 NOTE — Assessment & Plan Note (Signed)

## 2016-10-01 NOTE — Telephone Encounter (Signed)
I spoke with pt.at [facility] 16 30 today Needs to stop BOTH metformin and glipizide. PLS PA trajenta as a priority, this is entered, he has AKI Terbinafine sent for fungal nail infection Lower dose of pravastain entered and needs to be sent. Pls call and ascertain he understands this all, rept non fast chem 7 and EGFR in 4 weeks pls,  I have asked GI to follow the abn H pylori result /? pls ask

## 2016-10-01 NOTE — Assessment & Plan Note (Signed)
Bilateral tinea pdis and onychomycosis, will rx antifungal orally if LFT is normal

## 2016-10-01 NOTE — Assessment & Plan Note (Signed)
Over corrected with reported hypoglycemia, morning blood sugar low as 48, also with AKI D/C metformin and glipizide, start tradjenta Matthew Molina is reminded of the importance of commitment to daily physical activity for 30 minutes or more, as able and the need to limit carbohydrate intake to 30 to 60 grams per meal to help with blood sugar control.   The need to take medication as prescribed, test blood sugar as directed, and to call between visits if there is a concern that blood sugar is uncontrolled is also discussed.   Matthew Molina is reminded of the importance of daily foot exam, annual eye examination, and good blood sugar, blood pressure and cholesterol control.  Diabetic Labs Latest Ref Rng & Units 09/27/2016 06/15/2016 06/05/2016 05/23/2016 05/22/2016  HbA1c <5.7 % 5.5 - 6.2(H) - 5.9(H)  Microalbumin Not estab mg/dL - - - - -  Micro/Creat Ratio <30 mcg/mg creat - - - - -  Chol 125 - 200 mg/dL 116(L) - - - -  HDL >=40 mg/dL 34(L) - - - -  Calc LDL <130 mg/dL 48 - - - -  Triglycerides <150 mg/dL 168(H) - - - -  Creatinine 0.70 - 1.25 mg/dL 2.13(H) 1.58(H) 1.19 1.39(H) 1.38(H)   BP/Weight 09/29/2016 09/27/2016 08/24/2016 07/20/2016 07/10/2016 06/15/2016 99991111  Systolic BP - A999333 123456 123456 [id number] [id number]  Diastolic BP - 64 76 74 70 67 58  Wt. (Lbs) 235 235.08 238.8 247 245 240 240  BMI 31 31.02 31.51 32.59 32.32 31.67 31.67  Some encounter information is confidential and restricted. Go to Review Flowsheets activity to see all data.   Foot/eye exam completion dates Latest Ref Rng & Units 07/10/2016 08/30/2015  Eye Exam No Retinopathy - -  Foot Form Completion - Done Done      F/u in 6 weeks

## 2016-10-01 NOTE — Assessment & Plan Note (Signed)
Over corrected, pt to stop amlodipine and return for nurse BP check in 6 weeks

## 2016-10-01 NOTE — Assessment & Plan Note (Signed)
Discussed with pt the need to return to urology for re eval, stated not "ready" at this time Caregiver/ friend who is present stated that she will let his family know

## 2016-10-01 NOTE — Assessment & Plan Note (Signed)
Depo medrol in office followed by prednisone dose pack

## 2016-10-01 NOTE — Progress Notes (Signed)
Matthew Molina     MRN: [id number]      DOB: 1951-05-27   HPI: Patient is in for annual physical exam. .c/o 1 week increased wheezing and shortness of breath, denies fever, chills or sputum production, still smoking no quit date set, feels incapable at this time, of quitting Labs updated after visit and medication adjustments needed Immunization is reviewed , and  Updated Elects to delay urology re eval for elevated PSA    PE; Pleasant male, alert and oriented x 3, in no cardio-pulmonary distress. Afebrile. HEENT No facial trauma or asymetry. Sinuses non tender. EOMI. External ears normal, tympanic membranes clear. Oropharynx moist, no exudate. Neck: supple, no adenopathy,JVD or thyromegaly.No bruits.  Chest: . Decreased air entry throughout, bilateral wheezes, no crackles Non tender to palpation  Breast: No asymetry,no masses. No nipple discharge or inversion. No axillary or supraclavicular adenopathy  Cardiovascular system; Heart sounds normal,  S1 and  S2 ,no S3.  No murmur, or thrill. Apical beat not displaced Peripheral pulses normal.  Abdomen: Soft, non tender, no organomegaly or masses. No bruits. Bowel sounds normal. No guarding, tenderness or rebound.  Rectal:  Normal sphincter tone. External hemorrhoid. guaiac negative stool. Prostate enlarged   Musculoskeletal exam: Decreased ROM of spine, hips , shoulders and knees. swelling and  crepitus noted. No muscle wasting or atrophy.   Neurologic: Cranial nerves 2 to 12 intact. Power, tone , and reflexes normal throughout. No disturbance in gait. No tremor.  Skin: Intact, bilateral tinea pedis and onychomycosis with thickened toenails Pigmentation normal throughout  Psych; Normal mood and affect. Judgement and concentration normal   Assessment & Plan:  Annual physical exam Annual exam as documented. Counseling done  re healthy lifestyle involving commitment to 150 minutes exercise per week,  heart healthy diet, and attaining healthy weight.The importance of adequate sleep also discussed. Regular seat belt use and home safety, is also discussed. Changes in health habits are decided on by the patient with goals and time frames  set for achieving them. Immunization and cancer screening needs are specifically addressed at this visit.   COPD with exacerbation depomedrol in office 80 mg , then pred dose paxck, needs to quit nicotine  Essential hypertension Over corrected and tachycardic, d/c amlodipine Review in 4 weeks  Need for prophylactic vaccination and inoculation against influenza After obtaining informed consent, the vaccine is  administered by LPN.   COPD exacerbation ([place]) Depo medrol in office followed by prednisone dose pack  Hypertension Over corrected, pt to stop amlodipine and return for nurse BP check in 6 weeks  Tobacco abuse Patient counseled for approximately 5 minutes regarding the health risks of ongoing nicotine use, specifically all types of cancer, heart disease, stroke and respiratory failure. The options available for help with cessation ,the behavioral changes to assist the process, and the option to either gradully reduce usage  Or abruptly stop.is also discussed. Pt is also encouraged to set specific goals in number of cigarettes used daily, as well as to set a quit date.     Onychomycosis of foot with other complication Bilateral tinea pdis and onychomycosis, will rx antifungal orally if LFT is normal  Elevated PSA Discussed with pt the need to return to urology for re eval, stated not "ready" at this time Caregiver/ friend who is present stated that she will let his family know  Helicobacter pylori gastritis Being followed by GI, still has positive breath test, will forward to GI  Hyperlipidemia Over corrected reduce  pravachol dose Hyperlipidemia:Low fat diet discussed and encouraged.   Lipid Panel  Lab Results  Component Value  Date   CHOL 116 (L) 09/27/2016   HDL 34 (L) 09/27/2016   LDLCALC 48 09/27/2016   TRIG 168 (H) 09/27/2016   CHOLHDL 3.4 09/27/2016       Diabetes mellitus type 2 in obese ([place]) Over corrected with reported hypoglycemia, morning blood sugar low as 48, also with AKI D/C metformin and glipizide, start tradjenta Matthew Molina is reminded of the importance of commitment to daily physical activity for 30 minutes or more, as able and the need to limit carbohydrate intake to 30 to 60 grams per meal to help with blood sugar control.   The need to take medication as prescribed, test blood sugar as directed, and to call between visits if there is a concern that blood sugar is uncontrolled is also discussed.   Matthew Molina is reminded of the importance of daily foot exam, annual eye examination, and good blood sugar, blood pressure and cholesterol control.  Diabetic Labs Latest Ref Rng & Units 09/27/2016 06/15/2016 06/05/2016 05/23/2016 05/22/2016  HbA1c <5.7 % 5.5 - 6.2(H) - 5.9(H)  Microalbumin Not estab mg/dL - - - - -  Micro/Creat Ratio <30 mcg/mg creat - - - - -  Chol 125 - 200 mg/dL 116(L) - - - -  HDL >=40 mg/dL 34(L) - - - -  Calc LDL <130 mg/dL 48 - - - -  Triglycerides <150 mg/dL 168(H) - - - -  Creatinine 0.70 - 1.25 mg/dL 2.13(H) 1.58(H) 1.19 1.39(H) 1.38(H)   BP/Weight 09/29/2016 09/27/2016 08/24/2016 07/20/2016 07/10/2016 06/15/2016 99991111  Systolic BP - A999333 123456 123456 [id number] 123456  Diastolic BP - 64 76 74 70 67 58  Wt. (Lbs) 235 235.08 238.8 247 245 240 240  BMI 31 31.02 31.51 32.59 32.32 31.67 31.67  Some encounter information is confidential and restricted. Go to Review Flowsheets activity to see all data.   Foot/eye exam completion dates Latest Ref Rng & Units 07/10/2016 08/30/2015  Eye Exam No Retinopathy - -  Foot Form Completion - Done Done      F/u in 6 weeks

## 2016-10-01 NOTE — Assessment & Plan Note (Signed)
Over corrected reduce pravachol dose Hyperlipidemia:Low fat diet discussed and encouraged.   Lipid Panel  Lab Results  Component Value Date   CHOL 116 (L) 09/27/2016   HDL 34 (L) 09/27/2016   LDLCALC 48 09/27/2016   TRIG 168 (H) 09/27/2016   CHOLHDL 3.4 09/27/2016

## 2016-10-02 ENCOUNTER — Other Ambulatory Visit: Payer: Self-pay

## 2016-10-02 MED ORDER — TERBINAFINE HCL 250 MG PO TABS: 250.0000 mg | ORAL_TABLET | Freq: Every day | ORAL | 2 refills | Status: DC

## 2016-10-02 MED ORDER — PRAVASTATIN SODIUM 20 MG PO TABS: 20.0000 mg | ORAL_TABLET | Freq: Every day | ORAL | 5 refills | Status: DC

## 2016-10-02 MED ORDER — LINAGLIPTIN 5 MG PO TABS: 5.0000 mg | ORAL_TABLET | Freq: Every day | ORAL | 5 refills | Status: DC

## 2016-10-03 ENCOUNTER — Telehealth: Payer: Self-pay

## 2016-10-03 DIAGNOSIS — J441 Chronic obstructive pulmonary disease with (acute) exacerbation: Secondary | ICD-10-CM

## 2016-10-03 DIAGNOSIS — E669 Obesity, unspecified: Secondary | ICD-10-CM

## 2016-10-03 DIAGNOSIS — E1169 Type 2 diabetes mellitus with other specified complication: Secondary | ICD-10-CM

## 2016-10-03 NOTE — Telephone Encounter (Signed)
-----  Message from Fayrene Helper, MD sent at 10/01/2016  7:03 AM EDT ----- Needs to STOP metformin Pravachol reduced to 20 mg daily from 40 mg, PLS ensure pt and pharmacy understand Encourage 74 oz water daily, non fast chem 7 and EGFr in 4 weeks Then fasting lipid, cmp and EGFr and hBa1C in 3 months pls order both, explain to pt and caregiver ?? P;ls ask I will attempt to spk with pt today also but this will need f/u in am also to ensure he carries out  ?? pls ask

## 2016-10-06 NOTE — Addendum Note (Signed)
Addended by: Eual Fines on: 10/06/2016 10:09 AM   Modules accepted: Orders

## 2016-10-06 NOTE — Telephone Encounter (Signed)
Pt aware.

## 2016-10-15 ENCOUNTER — Telehealth: Payer: Self-pay | Admitting: Gastroenterology

## 2016-10-15 MED ORDER — BIS SUBCIT-METRONID-TETRACYC 140-125-125 MG PO CAPS: ORAL_CAPSULE | ORAL | 0 refills | Status: DC

## 2016-10-15 NOTE — Telephone Encounter (Signed)
PLEASE CALL PT. He STILL has H. Pylori gastritis. HE needs PYLERA 3 PILLS QID FOR 10 DAYS. HE should take OMEPRAZOLE TWICE DAILY for 3 MOS then once daily. The meds can cause nausea, vomiting, abd cramps, loose stools, black colored stools, and metallic taste in HIS mouth. OPV IN [facility] 2018.

## 2016-10-16 ENCOUNTER — Other Ambulatory Visit: Payer: Self-pay | Admitting: Family Medicine

## 2016-10-16 NOTE — Telephone Encounter (Signed)
ON RECALL  °

## 2016-10-16 NOTE — Telephone Encounter (Signed)
PT is aware to check the pharmacy for the [place].

## 2016-10-19 ENCOUNTER — Other Ambulatory Visit: Payer: Self-pay

## 2016-10-19 MED ORDER — TRIAMTERENE-HCTZ 37.5-25 MG PO TABS: ORAL_TABLET | ORAL | 5 refills | Status: DC

## 2016-10-23 ENCOUNTER — Other Ambulatory Visit: Payer: Self-pay | Admitting: Family Medicine

## 2016-11-01 DIAGNOSIS — J449 Chronic obstructive pulmonary disease, unspecified: Secondary | ICD-10-CM | POA: Diagnosis not present

## 2016-11-17 DIAGNOSIS — J449 Chronic obstructive pulmonary disease, unspecified: Secondary | ICD-10-CM | POA: Diagnosis not present

## 2016-11-20 ENCOUNTER — Other Ambulatory Visit: Payer: Self-pay | Admitting: Family Medicine

## 2016-11-27 ENCOUNTER — Other Ambulatory Visit: Payer: Self-pay | Admitting: Family Medicine

## 2016-11-30 ENCOUNTER — Other Ambulatory Visit: Payer: Self-pay | Admitting: Family Medicine

## 2016-12-01 DIAGNOSIS — J449 Chronic obstructive pulmonary disease, unspecified: Secondary | ICD-10-CM | POA: Diagnosis not present

## 2016-12-21 ENCOUNTER — Other Ambulatory Visit: Payer: Self-pay | Admitting: Family Medicine

## 2016-12-25 ENCOUNTER — Other Ambulatory Visit: Payer: Self-pay | Admitting: Family Medicine

## 2016-12-28 ENCOUNTER — Ambulatory Visit (INDEPENDENT_AMBULATORY_CARE_PROVIDER_SITE_OTHER): Payer: Medicare Other | Admitting: Family Medicine

## 2016-12-28 ENCOUNTER — Encounter: Payer: Self-pay | Admitting: Family Medicine

## 2016-12-28 VITALS — BP 104/74 | HR 74 | Resp 16 | Ht 73.0 in | Wt 227.0 lb

## 2016-12-28 DIAGNOSIS — Z720 Tobacco use: Secondary | ICD-10-CM

## 2016-12-28 DIAGNOSIS — E1169 Type 2 diabetes mellitus with other specified complication: Secondary | ICD-10-CM | POA: Diagnosis not present

## 2016-12-28 DIAGNOSIS — E785 Hyperlipidemia, unspecified: Secondary | ICD-10-CM

## 2016-12-28 DIAGNOSIS — E669 Obesity, unspecified: Secondary | ICD-10-CM

## 2016-12-28 DIAGNOSIS — F5104 Psychophysiologic insomnia: Secondary | ICD-10-CM

## 2016-12-28 DIAGNOSIS — I10 Essential (primary) hypertension: Secondary | ICD-10-CM

## 2016-12-28 DIAGNOSIS — F411 Generalized anxiety disorder: Secondary | ICD-10-CM

## 2016-12-28 DIAGNOSIS — N183 Chronic kidney disease, stage 3 unspecified: Secondary | ICD-10-CM

## 2016-12-28 DIAGNOSIS — J449 Chronic obstructive pulmonary disease, unspecified: Secondary | ICD-10-CM

## 2016-12-28 DIAGNOSIS — E66811 Obesity, class 1: Secondary | ICD-10-CM

## 2016-12-28 DIAGNOSIS — F209 Schizophrenia, unspecified: Secondary | ICD-10-CM

## 2016-12-28 MED ORDER — BUSPIRONE HCL 7.5 MG PO TABS: 7.5000 mg | ORAL_TABLET | Freq: Three times a day (TID) | ORAL | 4 refills | Status: DC

## 2016-12-28 MED ORDER — TRIAMTERENE-HCTZ 37.5-25 MG PO TABS: ORAL_TABLET | ORAL | 4 refills | Status: DC

## 2016-12-28 NOTE — Patient Instructions (Addendum)
F/u in 3.5 months, call if you need me before  HBa1C, cmp  and EGFR  Today  Fasting lipid, cmp and eGFR and hBA1C in 3 .5 months  Please work on stopping smoking, need to [employer] are referred for screening chest scan  Happy Birthday!   Break maxzide in hALF , as blood pressure is too low  Increase buspar to three times daily  Steps to Quit Smoking Smoking tobacco can be bad for your health. It can also affect almost every organ in your body. Smoking puts you and people around you at risk for many serious long-lasting (chronic) diseases. Quitting smoking is hard, but it is one of the best things that you can do for your health. It is never too late to quit. What are the benefits of quitting smoking? When you quit smoking, you lower your risk for getting serious diseases and conditions. They can include:  Lung cancer or lung disease.  Heart disease.  Stroke.  Heart attack.  Not being able to have children (infertility).  Weak bones (osteoporosis) and broken bones (fractures). If you have coughing, wheezing, and shortness of breath, those symptoms may get better when you quit. You may also get sick less often. If you are pregnant, quitting smoking can help to lower your chances of having a baby of low birth weight. What can I do to help me quit smoking? Talk with your doctor about what can help you quit smoking. Some things you can do (strategies) include:  Quitting smoking totally, instead of slowly cutting back how much you smoke over a period of time.  Going to in-person counseling. You are more likely to quit if you go to many counseling sessions.  Using resources and support systems, such as:  Online chats with a Veterinary surgeon.  Phone quitlines.  Printed Materials engineer.  Support groups or group counseling.  Text messaging programs.  Mobile phone apps or applications.  Taking medicines. Some of these medicines may have nicotine in them. If you are pregnant  or breastfeeding, do not take any medicines to quit smoking unless your doctor says it is okay. Talk with your doctor about counseling or other things that can help you. Talk with your doctor about using more than one strategy at the same time, such as taking medicines while you are also going to in-person counseling. This can help make quitting easier. What things can I do to make it easier to quit? Quitting smoking might feel very hard at first, but there is a lot that you can do to make it easier. Take these steps:  Talk to your family and friends. Ask them to support and encourage you.  Call phone quitlines, reach out to support groups, or work with a Veterinary surgeon.  Ask people who smoke to not smoke around you.  Avoid places that make you want (trigger) to smoke, such as:  Bars.  Parties.  Smoke-break areas at work.  Spend time with people who do not smoke.  Lower the stress in your life. Stress can make you want to smoke. Try these things to help your stress:  Getting regular exercise.  Deep-breathing exercises.  Yoga.  Meditating.  Doing a body scan. To do this, close your eyes, focus on one area of your body at a time from head to toe, and notice which parts of your body are tense. Try to relax the muscles in those areas.  Download or buy apps on your mobile phone or tablet that  can help you stick to your quit plan. There are many free apps, such as QuitGuide from the [employer] Office manager for Disease Control and Prevention). You can find more support from smokefree.gov and other websites. This information is not intended to replace advice given to you by your health care provider. Make sure you discuss any questions you have with your health care provider. Document Released: 09/30/2009 Document Revised: 08/01/2016 Document Reviewed: 04/20/2015 Elsevier Interactive Patient Education  2017 [employer].

## 2016-12-29 ENCOUNTER — Encounter: Payer: Self-pay | Admitting: Family Medicine

## 2016-12-29 ENCOUNTER — Other Ambulatory Visit: Payer: Self-pay

## 2016-12-29 ENCOUNTER — Ambulatory Visit: Payer: Commercial Managed Care - HMO | Admitting: Podiatry

## 2016-12-29 DIAGNOSIS — F17210 Nicotine dependence, cigarettes, uncomplicated: Secondary | ICD-10-CM

## 2016-12-29 LAB — COMPLETE METABOLIC PANEL WITH GFR
ALT: 18 U/L (ref 9–46)
AST: 17 U/L (ref 10–35)
Albumin: 4.4 g/dL (ref 3.6–5.1)
Alkaline Phosphatase: 45 U/L (ref 40–115)
BUN: 16 mg/dL (ref 7–25)
CO2: 25 mmol/L (ref 20–31)
Calcium: 9.9 mg/dL (ref 8.6–10.3)
Chloride: 94 mmol/L — ABNORMAL LOW (ref 98–110)
Creat: 1.51 mg/dL — ABNORMAL HIGH (ref 0.70–1.25)
GFR, Est African American: 55 mL/min — ABNORMAL LOW (ref >=60)
GFR, Est Non African American: 47 mL/min — ABNORMAL LOW (ref >=60)
Glucose, Bld: 99 mg/dL (ref 65–99)
Potassium: 3.6 mmol/L (ref 3.5–5.3)
Sodium: 131 mmol/L — ABNORMAL LOW (ref 135–146)
Total Bilirubin: 0.5 mg/dL (ref 0.2–1.2)
Total Protein: 7.2 g/dL (ref 6.1–8.1)

## 2016-12-29 LAB — HEMOGLOBIN A1C
Hgb A1c MFr Bld: 6.4 % — ABNORMAL HIGH (ref <5.7)
Mean Plasma Glucose: 137 mg/dL

## 2016-12-29 NOTE — Assessment & Plan Note (Signed)
Markedly improved, d/c medication Matthew Molina is reminded of the importance of commitment to daily physical activity for 30 minutes or more, as able and the need to limit carbohydrate intake to 30 to 60 grams per meal to help with blood sugar control.   The need to take medication as prescribed, test blood sugar as directed, and to call between visits if there is a concern that blood sugar is uncontrolled is also discussed.   Matthew Molina is reminded of the importance of daily foot exam, annual eye examination, and good blood sugar, blood pressure and cholesterol control.  Diabetic Labs Latest Ref Rng & Units 12/28/2016 09/27/2016 06/15/2016 06/05/2016 05/23/2016  HbA1c <5.7 % 6.4(H) 5.5 - 6.2(H) -  Microalbumin Not estab mg/dL - - - - -  Micro/Creat Ratio <30 mcg/mg creat - - - - -  Chol 125 - 200 mg/dL - 116(L) - - -  HDL >=40 mg/dL - 34(L) - - -  Calc LDL <130 mg/dL - 48 - - -  Triglycerides <150 mg/dL - 168(H) - - -  Creatinine 0.70 - 1.25 mg/dL 1.51(H) 2.13(H) 1.58(H) 1.19 1.39(H)   BP/Weight 12/28/2016 09/29/2016 09/27/2016 08/24/2016 07/20/2016 07/10/2016 123456  Systolic BP 123456 - A999333 123456 123456 [id number] A999333  Diastolic BP 74 - 64 76 74 70 67  Wt. (Lbs) 227 235 235.08 238.8 247 245 240  BMI 29.95 31 31.02 31.51 32.59 32.32 31.67  Some encounter information is confidential and restricted. Go to Review Flowsheets activity to see all data.   Foot/eye exam completion dates Latest Ref Rng & Units 07/10/2016 08/30/2015  Eye Exam No Retinopathy - -  Foot Form Completion - Done Done

## 2016-12-29 NOTE — Assessment & Plan Note (Signed)
Controlled, no change in medication  

## 2016-12-29 NOTE — Assessment & Plan Note (Signed)
Progressively worsening due to ongoing nicotine use, continue current chronic medication

## 2016-12-29 NOTE — Assessment & Plan Note (Signed)
Hyperlipidemia:Low fat diet discussed and encouraged.   Lipid Panel  Lab Results  Component Value Date   CHOL 116 (L) 09/27/2016   HDL 34 (L) 09/27/2016   LDLCALC 48 09/27/2016   TRIG 168 (H) 09/27/2016   CHOLHDL 3.4 09/27/2016   Needs to commit to regular exercise and reduce fatty food intake

## 2016-12-29 NOTE — Progress Notes (Signed)
DERREN LAVERNE     MRN: [id number]      DOB: [birth year]   HPI Mr. Szafran is here for follow up and re-evaluation of chronic medical conditions, medication management and review of any available recent lab and radiology data.  Preventive health is updated, specifically  Cancer screening and Immunization.   Questions or concerns regarding consultations or procedures which the PT has had in the interim are  addressed. The PT denies any adverse reactions to current medications since the last visit.  There are no new concerns.  There are no specific complaints   ROS Denies recent fever or chills. Denies sinus pressure, nasal congestion, ear pain or sore throat. Denies chest congestion, productive cough or wheezing. Denies chest pains, palpitations and leg swelling Denies abdominal pain, nausea, vomiting,diarrhea or constipation.   Denies uncontrolled  joint pain, swelling and limitation in mobility. Denies headaches, seizures, numbness, or tingling. Denies depression, c/o increased and uncontrolled  anxiety , denies  insomnia. Denies skin break down or rash.   PE  BP 104/74   Pulse 74   Resp 16   Ht 6\' 1"  (1.854 m)   Wt 227 lb (103 kg)   SpO2 95%   BMI 29.95 kg/m   Patient alert and oriented and in no cardiopulmonary distress.  HEENT: No facial asymmetry, EOMI,   oropharynx pink and moist.  Neck supple no JVD, no mass.  Chest: Clear to auscultation bilaterally.  CVS: S1, S2 no murmurs, no S3.Regular rate.  ABD: Soft non tender.   Ext: No edema  MS: adequate Though reduced ROM spine, shoulders, hips and knees.  Skin: Intact, no ulcerations or rash noted.  Psych: Good eye contact, flat affect, mildly anxious.  CNS: CN 2-12 intact, power,  normal throughout.no focal deficits noted.   Assessment & Plan  Hypertension Overcorrected, reduce  Maxizde dose DASH diet and commitment to daily physical activity for a minimum of 30 minutes discussed and encouraged, as a  part of hypertension management. The importance of attaining a healthy weight is also discussed.  BP/Weight 12/28/2016 09/29/2016 09/27/2016 08/24/2016 07/20/2016 07/10/2016 123456  Systolic BP 123456 - [id number] 123456 123456 [id number]  Diastolic BP 74 - 64 76 74 70 67  Wt. (Lbs) 227 235 235.08 238.8 247 245 240  BMI 29.95 31 31.02 31.51 32.59 32.32 31.67  Some encounter information is confidential and restricted. Go to Review Flowsheets activity to see all data.       Diabetes mellitus type 2 in obese (HCC) Markedly improved, d/c medication Mr. Betton is reminded of the importance of commitment to daily physical activity for 30 minutes or more, as able and the need to limit carbohydrate intake to 30 to 60 grams per meal to help with blood sugar control.   The need to take medication as prescribed, test blood sugar as directed, and to call between visits if there is a concern that blood sugar is uncontrolled is also discussed.   Mr. Sneed is reminded of the importance of daily foot exam, annual eye examination, and good blood sugar, blood pressure and cholesterol control.  Diabetic Labs Latest Ref Rng & Units 12/28/2016 09/27/2016 06/15/2016 06/05/2016 05/23/2016  HbA1c <5.7 % 6.4(H) 5.5 - 6.2(H) -  Microalbumin Not estab mg/dL - - - - -  Micro/Creat Ratio <30 mcg/mg creat - - - - -  Chol 125 - 200 mg/dL - 116(L) - - -  HDL >=40 mg/dL - 34(L) - - -  Calc LDL <130  mg/dL - 48 - - -  Triglycerides <150 mg/dL - 168(H) - - -  Creatinine 0.70 - 1.25 mg/dL 1.51(H) 2.13(H) 1.58(H) 1.19 1.39(H)   BP/Weight 12/28/2016 09/29/2016 09/27/2016 08/24/2016 07/20/2016 07/10/2016 123456  Systolic BP 123456 - A999333 123456 123456 [id number]  Diastolic BP 74 - 64 76 74 70 67  Wt. (Lbs) 227 235 235.08 238.8 247 245 240  BMI 29.95 31 31.02 31.51 32.59 32.32 31.67  Some encounter information is confidential and restricted. Go to Review Flowsheets activity to see all data.   Foot/eye exam completion dates Latest Ref Rng & Units 07/10/2016  08/30/2015  Eye Exam No Retinopathy - -  Foot Form Completion - Done Done        Hyperlipidemia Hyperlipidemia:Low fat diet discussed and encouraged.   Lipid Panel  Lab Results  Component Value Date   CHOL 116 (L) 09/27/2016   HDL 34 (L) 09/27/2016   LDLCALC 48 09/27/2016   TRIG 168 (H) 09/27/2016   CHOLHDL 3.4 09/27/2016   Needs to commit to regular exercise and reduce fatty food intake    Obesity (BMI 30.0-34.9) Improved. Pt applauded on succesful weight loss through lifestyle change, and encouraged to continue same. Weight loss goal set for the next several months.   Tobacco abuse Patient counseled for approximately 5 minutes regarding the health risks of ongoing nicotine use, specifically all types of cancer, heart disease, stroke and respiratory failure. The options available for help with cessation ,the behavioral changes to assist the process, and the option to either gradully reduce usage  Or abruptly stop.is also discussed. Pt is also encouraged to set specific goals in number of cigarettes used daily, as well as to set a quit date.     Schizophrenia, unspecified type ([place]) Controlled, no change in medication   Insomnia Controlled, no change in medication Sleep hygiene reviewed and written information offered also. Prescription sent for  medication needed.   CKD (chronic kidney disease) stage 3, GFR 30-59 ml/min Improved  COPD (chronic obstructive pulmonary disease) (HCC) Progressively worsening due to ongoing nicotine use, continue current chronic medication  GAD (generalized anxiety disorder) Uncontrolled increase dose of buspar

## 2016-12-29 NOTE — Assessment & Plan Note (Signed)
Improved

## 2016-12-29 NOTE — Assessment & Plan Note (Signed)
Overcorrected, reduce  Maxizde dose DASH diet and commitment to daily physical activity for a minimum of 30 minutes discussed and encouraged, as a part of hypertension management. The importance of attaining a healthy weight is also discussed.  BP/Weight 12/28/2016 09/29/2016 09/27/2016 08/24/2016 07/20/2016 07/10/2016 123456  Systolic BP 123456 - A999333 123456 123456 [id number]  Diastolic BP 74 - 64 76 74 70 67  Wt. (Lbs) 227 235 235.08 238.8 247 245 240  BMI 29.95 31 31.02 31.51 32.59 32.32 31.67  Some encounter information is confidential and restricted. Go to Review Flowsheets activity to see all data.

## 2016-12-29 NOTE — Assessment & Plan Note (Signed)

## 2016-12-29 NOTE — Assessment & Plan Note (Signed)
Uncontrolled increase dose of buspar

## 2016-12-29 NOTE — Assessment & Plan Note (Signed)
Controlled, no change in medication Sleep hygiene reviewed and written information offered also. Prescription sent for  medication needed.  

## 2016-12-29 NOTE — Assessment & Plan Note (Signed)
Improved. Pt applauded on succesful weight loss through lifestyle change, and encouraged to continue same. Weight loss goal set for the next several months.  

## 2017-01-01 DIAGNOSIS — J449 Chronic obstructive pulmonary disease, unspecified: Secondary | ICD-10-CM | POA: Diagnosis not present

## 2017-01-05 ENCOUNTER — Telehealth: Payer: Self-pay | Admitting: Family Medicine

## 2017-01-05 NOTE — Telephone Encounter (Signed)
Left message regarding need to schedule AWV °

## 2017-01-06 ENCOUNTER — Encounter (HOSPITAL_COMMUNITY): Payer: Self-pay | Admitting: *Deleted

## 2017-01-06 ENCOUNTER — Emergency Department (HOSPITAL_COMMUNITY)
Admission: EM | Admit: 2017-01-06 | Discharge: 2017-01-06 | Disposition: A | Discharge status: Home / Self Care | Payer: Medicare Other | Attending: Emergency Medicine | Admitting: Emergency Medicine

## 2017-01-06 ENCOUNTER — Emergency Department (HOSPITAL_COMMUNITY): Payer: Medicare Other

## 2017-01-06 DIAGNOSIS — I2510 Atherosclerotic heart disease of native coronary artery without angina pectoris: Secondary | ICD-10-CM | POA: Diagnosis not present

## 2017-01-06 DIAGNOSIS — R0602 Shortness of breath: Secondary | ICD-10-CM | POA: Diagnosis not present

## 2017-01-06 DIAGNOSIS — J441 Chronic obstructive pulmonary disease with (acute) exacerbation: Secondary | ICD-10-CM | POA: Diagnosis not present

## 2017-01-06 DIAGNOSIS — N183 Chronic kidney disease, stage 3 (moderate): Secondary | ICD-10-CM | POA: Diagnosis not present

## 2017-01-06 DIAGNOSIS — J209 Acute bronchitis, unspecified: Secondary | ICD-10-CM

## 2017-01-06 DIAGNOSIS — Z7982 Long term (current) use of aspirin: Secondary | ICD-10-CM | POA: Insufficient documentation

## 2017-01-06 DIAGNOSIS — I482 Chronic atrial fibrillation: Secondary | ICD-10-CM | POA: Diagnosis not present

## 2017-01-06 DIAGNOSIS — E1122 Type 2 diabetes mellitus with diabetic chronic kidney disease: Secondary | ICD-10-CM | POA: Insufficient documentation

## 2017-01-06 DIAGNOSIS — R05 Cough: Secondary | ICD-10-CM | POA: Diagnosis not present

## 2017-01-06 DIAGNOSIS — F17210 Nicotine dependence, cigarettes, uncomplicated: Secondary | ICD-10-CM | POA: Insufficient documentation

## 2017-01-06 DIAGNOSIS — Z79899 Other long term (current) drug therapy: Secondary | ICD-10-CM | POA: Insufficient documentation

## 2017-01-06 DIAGNOSIS — I129 Hypertensive chronic kidney disease with stage 1 through stage 4 chronic kidney disease, or unspecified chronic kidney disease: Secondary | ICD-10-CM | POA: Diagnosis not present

## 2017-01-06 DIAGNOSIS — R0682 Tachypnea, not elsewhere classified: Secondary | ICD-10-CM | POA: Diagnosis not present

## 2017-01-06 DIAGNOSIS — J440 Chronic obstructive pulmonary disease with acute lower respiratory infection: Secondary | ICD-10-CM

## 2017-01-06 LAB — CBC
HCT: 36.8 % — ABNORMAL LOW (ref 39.0–52.0)
Hemoglobin: 12.2 g/dL — ABNORMAL LOW (ref 13.0–17.0)
MCH: 29 pg (ref 26.0–34.0)
MCHC: 33.2 g/dL (ref 30.0–36.0)
MCV: 87.6 fL (ref 78.0–100.0)
Platelets: 249 10*3/uL (ref 150–400)
RBC: 4.2 MIL/uL — ABNORMAL LOW (ref 4.22–5.81)
RDW: 14.1 % (ref 11.5–15.5)
WBC: 5.5 10*3/uL (ref 4.0–10.5)

## 2017-01-06 LAB — BASIC METABOLIC PANEL
Anion gap: 10 (ref 5–15)
BUN: 13 mg/dL (ref 6–20)
CO2: 26 mmol/L (ref 22–32)
Calcium: 9.9 mg/dL (ref 8.9–10.3)
Chloride: 97 mmol/L — ABNORMAL LOW (ref 101–111)
Creatinine, Ser: 1.62 mg/dL — ABNORMAL HIGH (ref 0.61–1.24)
GFR calc Af Amer: 49 mL/min — ABNORMAL LOW (ref >60)
GFR calc non Af Amer: 42 mL/min — ABNORMAL LOW (ref >60)
Glucose, Bld: 249 mg/dL — ABNORMAL HIGH (ref 65–99)
Potassium: 3.3 mmol/L — ABNORMAL LOW (ref 3.5–5.1)
Sodium: 133 mmol/L — ABNORMAL LOW (ref 135–145)

## 2017-01-06 LAB — TROPONIN I: Troponin I: <0.03 ng/mL (ref <0.03)

## 2017-01-06 MED ORDER — IPRATROPIUM BROMIDE 0.02 % IN SOLN
0.5000 mg | Freq: Once | RESPIRATORY_TRACT | Status: AC
  Administered 2017-01-06: 0.5 mg via RESPIRATORY_TRACT
  Filled 2017-01-06: qty 2.5

## 2017-01-06 MED ORDER — ALBUTEROL SULFATE HFA 108 (90 BASE) MCG/ACT IN AERS: 2.0000 | INHALATION_SPRAY | RESPIRATORY_TRACT | 3 refills | Status: DC | PRN

## 2017-01-06 MED ORDER — METHYLPREDNISOLONE SODIUM SUCC 125 MG IJ SOLR
125.0000 mg | Freq: Once | INTRAMUSCULAR | Status: AC
  Administered 2017-01-06: 125 mg via INTRAVENOUS
  Filled 2017-01-06: qty 2

## 2017-01-06 MED ORDER — ALBUTEROL SULFATE (2.5 MG/3ML) 0.083% IN NEBU: 5.0000 mg | INHALATION_SOLUTION | Freq: Once | RESPIRATORY_TRACT | Status: DC

## 2017-01-06 MED ORDER — ALBUTEROL SULFATE (2.5 MG/3ML) 0.083% IN NEBU
2.5000 mg | INHALATION_SOLUTION | Freq: Once | RESPIRATORY_TRACT | Status: AC
  Administered 2017-01-06: 2.5 mg via RESPIRATORY_TRACT
  Filled 2017-01-06: qty 3

## 2017-01-06 MED ORDER — DOXYCYCLINE HYCLATE 100 MG PO CAPS: 100.0000 mg | ORAL_CAPSULE | Freq: Two times a day (BID) | ORAL | 0 refills | Status: DC

## 2017-01-06 MED ORDER — ALBUTEROL SULFATE (2.5 MG/3ML) 0.083% IN NEBU
5.0000 mg | INHALATION_SOLUTION | Freq: Once | RESPIRATORY_TRACT | Status: AC
  Administered 2017-01-06: 5 mg via RESPIRATORY_TRACT
  Filled 2017-01-06: qty 6

## 2017-01-06 MED ORDER — POTASSIUM CHLORIDE CRYS ER 20 MEQ PO TBCR
40.0000 meq | EXTENDED_RELEASE_TABLET | Freq: Once | ORAL | Status: AC
  Administered 2017-01-06: 40 meq via ORAL
  Filled 2017-01-06: qty 2

## 2017-01-06 MED ORDER — PREDNISONE 20 MG PO TABS: 60.0000 mg | ORAL_TABLET | Freq: Every day | ORAL | 0 refills | Status: DC

## 2017-01-06 MED ORDER — IPRATROPIUM BROMIDE 0.02 % IN SOLN: 0.5000 mg | Freq: Once | RESPIRATORY_TRACT | Status: DC

## 2017-01-06 MED ORDER — IPRATROPIUM-ALBUTEROL 0.5-2.5 (3) MG/3ML IN SOLN
3.0000 mL | Freq: Once | RESPIRATORY_TRACT | Status: AC
  Administered 2017-01-06: 3 mL via RESPIRATORY_TRACT
  Filled 2017-01-06: qty 3

## 2017-01-06 MED ORDER — DOXYCYCLINE HYCLATE 100 MG PO TABS
100.0000 mg | ORAL_TABLET | Freq: Once | ORAL | Status: AC
  Administered 2017-01-06: 100 mg via ORAL
  Filled 2017-01-06: qty 1

## 2017-01-06 NOTE — Discharge Instructions (Signed)
It was our pleasure to provide your ER care today - we hope that you feel better.  Use albuterol neb/treatment every 3-4 hours as need.  Take prednisone and doxycycline as prescribed.  Follow up with primary care doctor in the next 2-3 days for recheck if symptoms fail to improve/resolve.  Your potassium level is mildly low - eat plenty of fruits and vegetables, and follow up with your doctor.   Return to ER if worse, trouble breathing, chest pain, other concern.

## 2017-01-06 NOTE — ED Triage Notes (Signed)
Pt from home c/o SOB that started early this morning. HR 114 for EMS. O2 sat 96% on RA for EMS. Pt wears O2 at 2L via [place] at home. Pt used Albuterol breathing treatment this morning with some relief. Pt also c/o dry cough that started yesterday. Denies fever.

## 2017-01-06 NOTE — ED Provider Notes (Signed)
[facility] DEPT Provider Note   CSN: [id number] Arrival date & time: 01/06/17  [id number]  By signing my name below, I, Arianna Nassar, attest that this documentation has been prepared under the direction and in the presence of Lajean Saver, MD.  Electronically Signed: Julien Nordmann, ED Scribe. 01/06/17. 9:11 AM.    History   Chief Complaint Chief Complaint  Patient presents with  . Shortness of Breath   The history is provided by the patient and the EMS personnel. No language interpreter was used.   HPI Comments: Matthew Molina is a 66 y.o. male brought in by ambulance, who has a PMhx of COPD, DM, HLD, HTN, hypercholesterolemia, CKD, and GERD presents to the Emergency Department complaining of gradual worsening, moderate, shortness of breath that began this morning. He reports associated nasal congestion, intermittent headaches, and a dry cough that began yesterday morning. Pt states he believes his symptoms are related to his COPD. Pt is  2 L/min of home O2 at baseline. He reports using a nebulizer to alleviate his symptoms with moderate relief. Per EMS, his HR was 114 and he was 96% O2 on RA. He denies fever, chest pain, rhinorrhea, sore throat, leg swelling, abdominal pain, vomiting, diarrhea, and difficulty urinating. Pt is a smoker.   Past Medical History:  Diagnosis Date  . Acute kidney injury ([place])   . Acute respiratory failure with hypoxia ([place])   . Arthritis   . COPD (chronic obstructive pulmonary disease) ([place])   . Depression   . Diabetes mellitus   . Diabetes mellitus without complication ([place])   . Hypercholesterolemia   . Hyperlipidemia   . Hypertension   . Multiple lung nodules on CT 04/02/2015  . Nicotine addiction   . Obesity   . Oxygen deficiency    qhs  . Schizophrenia [facility])     Patient Active Problem List   Diagnosis Date Noted  . Elevated PSA 10/01/2016  . External hemorrhoid, thrombosed 08/24/2016  . GERD (gastroesophageal reflux disease) 08/24/2016  .  CKD (chronic kidney disease) stage 3, GFR 30-59 ml/min 05/22/2016  . Dysphagia 01/05/2016  . Insomnia 01/05/2016  . COPD (chronic obstructive pulmonary disease) ([place]) 11/05/2015  . Hypertension 11/05/2015  . Tobacco abuse 07/24/2015  . Coronary atherosclerosis of native coronary artery 04/02/2015  . Multiple lung nodules on CT 04/02/2015  . GAD (generalized anxiety disorder) 07/04/2012  . Back pain with radiation 11/01/2011  . Diabetes mellitus type 2 in obese ([place]) 04/17/2011  . Hyperlipidemia 06/02/2008  . Obesity (BMI 30.0-34.9) 06/02/2008  . Schizophrenia, unspecified type ([place]) 06/02/2008    Past Surgical History:  Procedure Laterality Date  . CATARACT EXTRACTION W/PHACO Left 11/20/2013   Procedure: CATARACT EXTRACTION PHACO AND INTRAOCULAR LENS PLACEMENT (IOC);  Surgeon: Tonny Branch, MD;  Location: [facility] ORS;  Service: Ophthalmology;  Laterality: Left;  CDE:10.26  . CATARACT EXTRACTION W/PHACO Right 12/08/2013   Procedure: RIGHT EYE CATARACT EXTRACTION PHACO AND INTRAOCULAR LENS PLACEMENT ;  Surgeon: Tonny Branch, MD;  Location: [facility] ORS;  Service: Ophthalmology;  Laterality: Right;  CDE 12.38  . COLONOSCOPY N/A 04/01/2013   Procedure: COLONOSCOPY;  Surgeon: Danie Binder, MD;  Location: [facility] ENDO SUITE;  Service: Endoscopy;  Laterality: N/A;  10:00 AM-moved to [place] notified pt  . COLONOSCOPY  2014   INCOMPLETE PREP IN R COLON  . ESOPHAGOGASTRODUODENOSCOPY N/A 02/18/2016   Procedure: ESOPHAGOGASTRODUODENOSCOPY (EGD);  Surgeon: Danie Binder, MD;  Location: [facility] ENDO SUITE;  Service: Endoscopy;  Laterality: N/A;  1145  .  EYE SURGERY Left 11/2013   cataract extraction  . SAVORY DILATION N/A 02/18/2016   Procedure: SAVORY DILATION;  Surgeon: Danie Binder, MD;  Location: [facility] ENDO SUITE;  Service: Endoscopy;  Laterality: N/A;  . SHOULDER SURGERY         Home Medications    Prior to Admission medications   Medication Sig Start Date End Date Taking? Authorizing Provider  ACCU-CHEK  SOFTCLIX LANCETS lancets TEST BLOOD SUGAR ONCE DAILY AS DIRECTED. 07/26/16   Fayrene Helper, MD  acetaminophen (TYLENOL) 500 MG tablet Take 500 mg by mouth daily as needed.    Historical Provider, MD  albuterol (PROVENTIL HFA;VENTOLIN HFA) 108 (90 BASE) MCG/ACT inhaler Inhale 2 puffs into the lungs every 6 (six) hours as needed for wheezing or shortness of breath. 07/29/15   Ezequiel Essex, MD  aspirin EC 81 MG tablet Take 81 mg by mouth daily.    Historical Provider, MD  bismuth-metronidazole-tetracycline (PYLERA) 140-125-125 MG capsule 3 PO QID FOR 10 DAYS 10/15/16   Danie Binder, MD  busPIRone (BUSPAR) 7.5 MG tablet Take 1 tablet (7.5 mg total) by mouth 3 (three) times daily. 12/28/16   Fayrene Helper, MD  glucose blood (ACCU-CHEK AVIVA PLUS) test strip Once daily testing E11.9, E66.9 09/12/16   Fayrene Helper, MD  hydrocortisone-pramoxine (PROCTOFOAM [facility]) rectal foam Place 1 applicator rectally 3 (three) times daily. 07/20/16   Fayrene Helper, MD  ipratropium-albuterol (DUONEB) 0.5-2.5 (3) MG/3ML SOLN INHALE 1 VIAL VIA NEBULIZER EVERY FOUR HOURS AS NEEDED. 03/02/16   Fayrene Helper, MD  ipratropium-albuterol (DUONEB) 0.5-2.5 (3) MG/3ML SOLN USE 1 VIAL(3 ML) IN NEBULIZER EVERY 4 TO 6 HOURS 11/27/16   Fayrene Helper, MD  omeprazole (PRILOSEC) 20 MG capsule 1 PO 30 mins prior to breakfast and supper 02/18/16   Danie Binder, MD  polyethylene glycol powder (GLYCOLAX/MIRALAX) powder MIX 1 CAPFUL (17G) IN 8 OUNCES OF JUICE/WATER AND DRINK ONCE DAILY. 12/23/15   Fayrene Helper, MD  pravastatin (PRAVACHOL) 20 MG tablet Take 1 tablet (20 mg total) by mouth daily. NEW DOSE 10/02/16   Fayrene Helper, MD  predniSONE (STERAPRED UNI-PAK 21 TAB) 5 MG (21) TBPK tablet Take 1 tablet (5 mg total) by mouth as directed. Use as directed 09/27/16   Fayrene Helper, MD  risperiDONE (RISPERDAL) 1 MG tablet TAKE (1) TABLET BY MOUTH AT BEDTIME WITH 4MG . 10/23/16   Fayrene Helper, MD    risperidone (RISPERDAL) 4 MG tablet TAKE (1) TABLET BY MOUTH AT BEDTIME. 12/22/16   Fayrene Helper, MD  SPIRIVA HANDIHALER 18 MCG inhalation capsule INHALE 1 CAPSULE AS DIRECTED ONCE A DAY. 02/22/15   Fayrene Helper, MD  SYMBICORT 80-4.5 MCG/ACT inhaler INHALE 2 PUFFS INTO LUNGS TWICE DAILY. 11/20/16   Fayrene Helper, MD  traZODone (DESYREL) 50 MG tablet TAKE ONE TABLET BY MOUTH AT BEDTIME AS NEEDED FOR SLEEP. 10/23/16   Fayrene Helper, MD  triamterene-hydrochlorothiazide [facility]) 37.5-25 MG tablet Half tablet once daily 12/28/16   Fayrene Helper, MD  valsartan (DIOVAN) 320 MG tablet TAKE ONE TABLET BY MOUTH DAILY. 06/26/16   Fayrene Helper, MD    Family History Family History  Problem Relation Age of Onset  . Diabetes Mother   . Hypertension Mother   . Stroke Mother   . Diabetes Sister   . Heart disease Sister   . Kidney disease Father   . Colon cancer Neg Hx   . Colon polyps Neg Hx  Social History Social History  Substance Use Topics  . Smoking status: Current Every Day Smoker    Packs/day: 1.50    Types: Cigarettes  . Smokeless tobacco: Never Used  . Alcohol use No     Allergies   Sertraline hcl and Wellbutrin [bupropion]   Review of Systems Review of Systems  Constitutional: Negative for fever.  HENT: Positive for congestion. Negative for rhinorrhea and sore throat.   Eyes: Negative for visual disturbance.  Respiratory: Positive for cough, shortness of breath and wheezing.   Cardiovascular: Negative for chest pain, palpitations and leg swelling.  Gastrointestinal: Negative for abdominal pain, diarrhea, nausea and vomiting.  Genitourinary: Negative for difficulty urinating and dysuria.  Musculoskeletal: Negative for back pain and neck pain.  Skin: Negative for rash.  Neurological: Positive for headaches.  Hematological: Does not bruise/bleed easily.  Psychiatric/Behavioral: Negative for confusion.     Physical Exam Updated Vital  Signs BP (!) 141/106   Pulse 108   Temp 97.8 F (36.6 C) (Oral)   Resp 24   Ht 6\' 1"  (1.854 m)   Wt 220 lb (99.8 kg)   BMI 29.03 kg/m   Physical Exam  Constitutional: He is oriented to person, place, and time. He appears well-developed and well-nourished. No distress.  HENT:  Head: Normocephalic and atraumatic.  Mouth/Throat: Oropharynx is clear and moist.  Eyes: EOM are normal.  Neck: Normal range of motion. No tracheal deviation present.  No stiffness or rigidity  Cardiovascular: Normal rate, regular rhythm, normal heart sounds and intact distal pulses.  Exam reveals no gallop and no friction rub.   No murmur heard. Pulmonary/Chest: He has wheezes.  Diminished air movement and wheezing bilaterally. Coughing.   Abdominal: Soft. He exhibits no distension. There is no tenderness.  Musculoskeletal: Normal range of motion. He exhibits no edema or tenderness.  Neurological: He is alert and oriented to person, place, and time.  Skin: Skin is warm and dry. No rash noted. He is not diaphoretic.  Psychiatric: He has a normal mood and affect. Judgment normal.  Nursing note and vitals reviewed.    ED Treatments / Results  COORDINATION OF CARE:  9:00 AM Discussed treatment plan with pt at bedside and pt agreed to plan.  Labs (all labs ordered are listed, but only abnormal results are displayed) Results for orders placed or performed during the hospital encounter of 01/06/17  CBC  Result Value Ref Range   WBC 5.5 4.0 - 10.5 K/uL   RBC 4.20 (L) 4.22 - 5.81 MIL/uL   Hemoglobin 12.2 (L) 13.0 - 17.0 g/dL   HCT 36.8 (L) 39.0 - 52.0 %   MCV 87.6 78.0 - 100.0 fL   MCH 29.0 26.0 - 34.0 pg   MCHC 33.2 30.0 - 36.0 g/dL   RDW 14.1 11.5 - 15.5 %   Platelets 249 150 - 400 K/uL  Basic metabolic panel  Result Value Ref Range   Sodium 133 (L) 135 - 145 mmol/L   Potassium 3.3 (L) 3.5 - 5.1 mmol/L   Chloride 97 (L) 101 - 111 mmol/L   CO2 26 22 - 32 mmol/L   Glucose, Bld 249 (H) 65 - 99  mg/dL   BUN 13 6 - 20 mg/dL   Creatinine, Ser 1.62 (H) 0.61 - 1.24 mg/dL   Calcium 9.9 8.9 - 10.3 mg/dL   GFR calc non Af Amer 42 (L) >60 mL/min   GFR calc Af Amer 49 (L) >60 mL/min   Anion gap 10 5 -  15  Troponin I  Result Value Ref Range   Troponin I <0.03 <0.03 ng/mL   Dg Chest 2 View  Result Date: 01/06/2017 CLINICAL DATA:  Shortness of breath, history COPD EXAM: CHEST  2 VIEW COMPARISON:  06/15/2016, 05/22/2016, 01/22/2016 FINDINGS: The lungs are hyperinflated likely secondary to COPD. There is bilateral lower lobe and right middle lobe interstitial thickening similar to prior exams. There is no focal consolidation. There is no pleural effusion or pneumothorax. The heart and mediastinal contours are unremarkable. The osseous structures are unremarkable. IMPRESSION: No active cardiopulmonary disease. Electronically Signed   By: Kathreen Devoid   On: 01/06/2017 09:53    EKG  EKG Interpretation None       Radiology Dg Chest 2 View  Result Date: 01/06/2017 CLINICAL DATA:  Shortness of breath, history COPD EXAM: CHEST  2 VIEW COMPARISON:  06/15/2016, 05/22/2016, 01/22/2016 FINDINGS: The lungs are hyperinflated likely secondary to COPD. There is bilateral lower lobe and right middle lobe interstitial thickening similar to prior exams. There is no focal consolidation. There is no pleural effusion or pneumothorax. The heart and mediastinal contours are unremarkable. The osseous structures are unremarkable. IMPRESSION: No active cardiopulmonary disease. Electronically Signed   By: Kathreen Devoid   On: 01/06/2017 09:53    Procedures Procedures (including critical care time)  Medications Ordered in ED Medications - No data to display   Initial Impression / Assessment and Plan / ED Course  I have reviewed the triage vital signs and the nursing notes.  Pertinent labs & imaging results that were available during my care of the patient were reviewed by me and considered in my medical decision  making (see chart for details).  Albuterol and atrovent neb.    Persistent wheezing.   Solumedrol iv.  Additional albuterol and atrovent nebs.  Recheck, improved air exchange. Breathing comfortably.  Patient indicates feels ready for d/c.  No chest pain.  kcl po.  Given copd, inc cough, ?sbj fever, will rx abx.   Patient currently appears stable for d/c.     Final Clinical Impressions(s) / ED Diagnoses   Final diagnoses:  None  I personally performed the services described in this documentation, which was scribed in my presence. The recorded information has been reviewed and considered. Lajean Saver, MD  New Prescriptions New Prescriptions   No medications on file     Lajean Saver, MD 01/06/17 1255

## 2017-01-06 NOTE — ED Notes (Signed)
ED Provider at bedside. 

## 2017-01-10 ENCOUNTER — Other Ambulatory Visit: Payer: Self-pay | Admitting: Family Medicine

## 2017-01-10 ENCOUNTER — Other Ambulatory Visit: Payer: Self-pay

## 2017-01-10 ENCOUNTER — Telehealth: Payer: Self-pay

## 2017-01-10 MED ORDER — GLIPIZIDE ER 5 MG PO TB24: 5.0000 mg | ORAL_TABLET | Freq: Every day | ORAL | 1 refills | Status: DC

## 2017-01-10 NOTE — Telephone Encounter (Signed)
Patient aware and Rx sent to pharmacy with request to deliver

## 2017-01-10 NOTE — Telephone Encounter (Signed)
Prescribed 4 days only to end today so bG should improve soon,  I have entered glipizide eR 5 mg one daily, may take this for 5 to 10 days, will likley not need to continue past this , last  bDG was excellent, pls send after you spk with him He may call back with questions

## 2017-01-11 ENCOUNTER — Ambulatory Visit (HOSPITAL_COMMUNITY)
Admission: RE | Admit: 2017-01-11 | Discharge: 2017-01-11 | Disposition: A | Discharge status: Home / Self Care | Payer: Medicare Other | Source: Ambulatory Visit | Attending: Family Medicine | Admitting: Family Medicine

## 2017-01-11 DIAGNOSIS — J439 Emphysema, unspecified: Secondary | ICD-10-CM | POA: Insufficient documentation

## 2017-01-11 DIAGNOSIS — F17200 Nicotine dependence, unspecified, uncomplicated: Secondary | ICD-10-CM | POA: Insufficient documentation

## 2017-01-11 DIAGNOSIS — J90 Pleural effusion, not elsewhere classified: Secondary | ICD-10-CM | POA: Insufficient documentation

## 2017-01-11 DIAGNOSIS — R918 Other nonspecific abnormal finding of lung field: Secondary | ICD-10-CM | POA: Insufficient documentation

## 2017-01-11 DIAGNOSIS — I700 Atherosclerosis of aorta: Secondary | ICD-10-CM | POA: Insufficient documentation

## 2017-01-11 DIAGNOSIS — Z122 Encounter for screening for malignant neoplasm of respiratory organs: Secondary | ICD-10-CM | POA: Diagnosis not present

## 2017-01-11 DIAGNOSIS — I2510 Atherosclerotic heart disease of native coronary artery without angina pectoris: Secondary | ICD-10-CM | POA: Insufficient documentation

## 2017-01-11 DIAGNOSIS — Z720 Tobacco use: Secondary | ICD-10-CM

## 2017-01-11 DIAGNOSIS — Z87891 Personal history of nicotine dependence: Secondary | ICD-10-CM | POA: Diagnosis not present

## 2017-01-22 ENCOUNTER — Encounter: Payer: Self-pay | Admitting: Gastroenterology

## 2017-01-22 ENCOUNTER — Other Ambulatory Visit: Payer: Self-pay | Admitting: Family Medicine

## 2017-01-25 ENCOUNTER — Telehealth: Payer: Self-pay

## 2017-01-25 NOTE — Telephone Encounter (Signed)
I advise OTC benadryl 25 mg one at bedtime or tylenol PM  one at bedtime

## 2017-01-26 ENCOUNTER — Other Ambulatory Visit: Payer: Self-pay | Admitting: Family Medicine

## 2017-01-26 NOTE — Telephone Encounter (Signed)
Patient aware.

## 2017-02-01 DIAGNOSIS — J449 Chronic obstructive pulmonary disease, unspecified: Secondary | ICD-10-CM | POA: Diagnosis not present

## 2017-02-02 ENCOUNTER — Encounter: Payer: Self-pay | Admitting: Podiatry

## 2017-02-02 ENCOUNTER — Ambulatory Visit (INDEPENDENT_AMBULATORY_CARE_PROVIDER_SITE_OTHER): Payer: Medicare Other | Admitting: Podiatry

## 2017-02-02 DIAGNOSIS — E1149 Type 2 diabetes mellitus with other diabetic neurological complication: Secondary | ICD-10-CM | POA: Diagnosis not present

## 2017-02-02 DIAGNOSIS — Q828 Other specified congenital malformations of skin: Secondary | ICD-10-CM

## 2017-02-02 DIAGNOSIS — E1140 Type 2 diabetes mellitus with diabetic neuropathy, unspecified: Secondary | ICD-10-CM

## 2017-02-02 DIAGNOSIS — M79676 Pain in unspecified toe(s): Secondary | ICD-10-CM

## 2017-02-02 DIAGNOSIS — M2141 Flat foot [pes planus] (acquired), right foot: Secondary | ICD-10-CM

## 2017-02-02 DIAGNOSIS — B351 Tinea unguium: Secondary | ICD-10-CM

## 2017-02-02 DIAGNOSIS — M2142 Flat foot [pes planus] (acquired), left foot: Secondary | ICD-10-CM

## 2017-02-03 NOTE — Progress Notes (Signed)
Subjective:     Patient ID: Matthew Molina, male   DOB: Nov 13, 1951, 66 y.o.   MRN: [id number]  HPI patient presents with significant lesion formation first fifth metatarsal bilateral and nail disease 1-5 both feet that he cannot cut. He is a long-term diabetic and has risk factors   Review of Systems     Objective:   Physical Exam Significant diminishment of neurological sensation both sharp [place] vibratory and DTR reflexes with patient noted to have lesions on the first and fifth metatarsals bilateral with porokeratotic-like centers and pain when palpated with nail disease 1-5 both feet that are thick yellow and brittle. Patient is also found to have significant flatfoot deformity    Assessment:     At risk diabetic with significant lesion formation bilateral and thick yellow brittle nailbeds disease 1-5 both feet    Plan:     H&P condition discussed and debrided nailbeds 1-5 both feet and lesions bilateral with no iatrogenic bleeding noted

## 2017-02-05 ENCOUNTER — Other Ambulatory Visit: Payer: Self-pay

## 2017-02-05 ENCOUNTER — Telehealth: Payer: Self-pay | Admitting: Family Medicine

## 2017-02-05 ENCOUNTER — Emergency Department (HOSPITAL_COMMUNITY): Payer: Medicare Other

## 2017-02-05 ENCOUNTER — Encounter (HOSPITAL_COMMUNITY): Payer: Self-pay | Admitting: Emergency Medicine

## 2017-02-05 ENCOUNTER — Observation Stay (HOSPITAL_COMMUNITY)
Admission: EM | Admit: 2017-02-05 | Discharge: 2017-02-06 | Disposition: A | Discharge status: Home / Self Care | Payer: Medicare Other | Attending: Family Medicine | Admitting: Family Medicine

## 2017-02-05 DIAGNOSIS — R531 Weakness: Secondary | ICD-10-CM | POA: Diagnosis not present

## 2017-02-05 DIAGNOSIS — I10 Essential (primary) hypertension: Secondary | ICD-10-CM | POA: Insufficient documentation

## 2017-02-05 DIAGNOSIS — Z720 Tobacco use: Secondary | ICD-10-CM | POA: Diagnosis present

## 2017-02-05 DIAGNOSIS — N179 Acute kidney failure, unspecified: Secondary | ICD-10-CM | POA: Diagnosis not present

## 2017-02-05 DIAGNOSIS — E1169 Type 2 diabetes mellitus with other specified complication: Secondary | ICD-10-CM | POA: Diagnosis not present

## 2017-02-05 DIAGNOSIS — E876 Hypokalemia: Secondary | ICD-10-CM | POA: Diagnosis not present

## 2017-02-05 DIAGNOSIS — Z7984 Long term (current) use of oral hypoglycemic drugs: Secondary | ICD-10-CM | POA: Diagnosis not present

## 2017-02-05 DIAGNOSIS — E119 Type 2 diabetes mellitus without complications: Secondary | ICD-10-CM | POA: Insufficient documentation

## 2017-02-05 DIAGNOSIS — F17210 Nicotine dependence, cigarettes, uncomplicated: Secondary | ICD-10-CM | POA: Insufficient documentation

## 2017-02-05 DIAGNOSIS — J441 Chronic obstructive pulmonary disease with (acute) exacerbation: Principal | ICD-10-CM | POA: Diagnosis present

## 2017-02-05 DIAGNOSIS — R061 Stridor: Secondary | ICD-10-CM | POA: Diagnosis not present

## 2017-02-05 DIAGNOSIS — Z79899 Other long term (current) drug therapy: Secondary | ICD-10-CM | POA: Diagnosis not present

## 2017-02-05 DIAGNOSIS — E669 Obesity, unspecified: Secondary | ICD-10-CM | POA: Diagnosis not present

## 2017-02-05 DIAGNOSIS — R0602 Shortness of breath: Secondary | ICD-10-CM | POA: Diagnosis not present

## 2017-02-05 DIAGNOSIS — R404 Transient alteration of awareness: Secondary | ICD-10-CM | POA: Diagnosis not present

## 2017-02-05 DIAGNOSIS — R05 Cough: Secondary | ICD-10-CM | POA: Diagnosis not present

## 2017-02-05 LAB — CBC WITH DIFFERENTIAL/PLATELET
Basophils Absolute: 0 10*3/uL (ref 0.0–0.1)
Basophils Relative: 1 %
Eosinophils Absolute: 0.8 10*3/uL — ABNORMAL HIGH (ref 0.0–0.7)
Eosinophils Relative: 12 %
HCT: 40.9 % (ref 39.0–52.0)
Hemoglobin: 13.5 g/dL (ref 13.0–17.0)
Lymphocytes Relative: 35 %
Lymphs Abs: 2.3 10*3/uL (ref 0.7–4.0)
MCH: 28.8 pg (ref 26.0–34.0)
MCHC: 33 g/dL (ref 30.0–36.0)
MCV: 87.2 fL (ref 78.0–100.0)
Monocytes Absolute: 0.7 10*3/uL (ref 0.1–1.0)
Monocytes Relative: 10 %
Neutro Abs: 2.8 10*3/uL (ref 1.7–7.7)
Neutrophils Relative %: 42 %
Platelets: 263 10*3/uL (ref 150–400)
RBC: 4.69 MIL/uL (ref 4.22–5.81)
RDW: 14.4 % (ref 11.5–15.5)
WBC: 6.7 10*3/uL (ref 4.0–10.5)

## 2017-02-05 LAB — GLUCOSE, CAPILLARY: Glucose-Capillary: 189 mg/dL — ABNORMAL HIGH (ref 65–99)

## 2017-02-05 LAB — BASIC METABOLIC PANEL
Anion gap: 11 (ref 5–15)
BUN: 9 mg/dL (ref 6–20)
CO2: 25 mmol/L (ref 22–32)
Calcium: 10 mg/dL (ref 8.9–10.3)
Chloride: 97 mmol/L — ABNORMAL LOW (ref 101–111)
Creatinine, Ser: 1.35 mg/dL — ABNORMAL HIGH (ref 0.61–1.24)
GFR calc Af Amer: >60 mL/min (ref >60)
GFR calc non Af Amer: 53 mL/min — ABNORMAL LOW (ref >60)
Glucose, Bld: 79 mg/dL (ref 65–99)
Potassium: 3.3 mmol/L — ABNORMAL LOW (ref 3.5–5.1)
Sodium: 133 mmol/L — ABNORMAL LOW (ref 135–145)

## 2017-02-05 LAB — I-STAT TROPONIN, ED: Troponin i, poc: 0 ng/mL (ref 0.00–0.08)

## 2017-02-05 MED ORDER — PANTOPRAZOLE SODIUM 40 MG PO TBEC
40.0000 mg | DELAYED_RELEASE_TABLET | Freq: Every day | ORAL | Status: DC
  Administered 2017-02-05 – 2017-02-06 (×2): 40 mg via ORAL
  Filled 2017-02-05 (×2): qty 1

## 2017-02-05 MED ORDER — ALBUTEROL SULFATE (2.5 MG/3ML) 0.083% IN NEBU
2.5000 mg | INHALATION_SOLUTION | RESPIRATORY_TRACT | Status: DC
  Administered 2017-02-06 (×3): 2.5 mg via RESPIRATORY_TRACT
  Filled 2017-02-05 (×3): qty 3

## 2017-02-05 MED ORDER — ENOXAPARIN SODIUM 40 MG/0.4ML ~~LOC~~ SOLN
40.0000 mg | SUBCUTANEOUS | Status: DC
  Administered 2017-02-05: 40 mg via SUBCUTANEOUS
  Filled 2017-02-05: qty 0.4

## 2017-02-05 MED ORDER — PRAVASTATIN SODIUM 10 MG PO TABS
20.0000 mg | ORAL_TABLET | Freq: Every day | ORAL | Status: DC
  Administered 2017-02-05: 20 mg via ORAL
  Filled 2017-02-05: qty 2

## 2017-02-05 MED ORDER — RISPERIDONE 1 MG PO TABS
1.0000 mg | ORAL_TABLET | Freq: Every day | ORAL | Status: DC
  Administered 2017-02-05: 1 mg via ORAL
  Filled 2017-02-05: qty 1

## 2017-02-05 MED ORDER — PREDNISONE 20 MG PO TABS: 20.0000 mg | ORAL_TABLET | Freq: Two times a day (BID) | ORAL | 0 refills | Status: DC

## 2017-02-05 MED ORDER — MOMETASONE FURO-FORMOTEROL FUM 100-5 MCG/ACT IN AERO
INHALATION_SPRAY | RESPIRATORY_TRACT | Status: AC
  Filled 2017-02-05: qty 8.8

## 2017-02-05 MED ORDER — IRBESARTAN 150 MG PO TABS
300.0000 mg | ORAL_TABLET | Freq: Every day | ORAL | Status: DC
  Administered 2017-02-06: 300 mg via ORAL
  Filled 2017-02-05: qty 2

## 2017-02-05 MED ORDER — TIOTROPIUM BROMIDE MONOHYDRATE 18 MCG IN CAPS
18.0000 ug | ORAL_CAPSULE | Freq: Every day | RESPIRATORY_TRACT | Status: DC
  Administered 2017-02-06: 18 ug via RESPIRATORY_TRACT
  Filled 2017-02-05: qty 5

## 2017-02-05 MED ORDER — IPRATROPIUM-ALBUTEROL 0.5-2.5 (3) MG/3ML IN SOLN
3.0000 mL | RESPIRATORY_TRACT | Status: DC
  Administered 2017-02-05: 3 mL via RESPIRATORY_TRACT
  Filled 2017-02-05: qty 3

## 2017-02-05 MED ORDER — POTASSIUM CHLORIDE CRYS ER 20 MEQ PO TBCR
20.0000 meq | EXTENDED_RELEASE_TABLET | Freq: Once | ORAL | Status: AC
  Administered 2017-02-05: 20 meq via ORAL
  Filled 2017-02-05: qty 1

## 2017-02-05 MED ORDER — ASPIRIN EC 81 MG PO TBEC
81.0000 mg | DELAYED_RELEASE_TABLET | Freq: Every day | ORAL | Status: DC
  Administered 2017-02-06: 81 mg via ORAL
  Filled 2017-02-05: qty 1

## 2017-02-05 MED ORDER — PREDNISONE 50 MG PO TABS
60.0000 mg | ORAL_TABLET | Freq: Once | ORAL | Status: AC
  Administered 2017-02-05: 60 mg via ORAL
  Filled 2017-02-05: qty 1

## 2017-02-05 MED ORDER — DEXTROSE 5 % IV SOLN
INTRAVENOUS | Status: AC
  Filled 2017-02-05: qty 500

## 2017-02-05 MED ORDER — IPRATROPIUM-ALBUTEROL 0.5-2.5 (3) MG/3ML IN SOLN
3.0000 mL | Freq: Once | RESPIRATORY_TRACT | Status: AC
Start: 2017-02-05 — End: 2017-02-05
  Administered 2017-02-05: 3 mL via RESPIRATORY_TRACT
  Filled 2017-02-05: qty 3

## 2017-02-05 MED ORDER — TRIAMTERENE-HCTZ 37.5-25 MG PO TABS: 1.0000 | ORAL_TABLET | Freq: Every day | ORAL | Status: DC

## 2017-02-05 MED ORDER — TRIAMTERENE-HCTZ 37.5-25 MG PO TABS
0.5000 | ORAL_TABLET | Freq: Every day | ORAL | Status: DC
  Administered 2017-02-06: 0.5 via ORAL
  Filled 2017-02-05: qty 1

## 2017-02-05 MED ORDER — ACETAMINOPHEN 325 MG PO TABS: 650.0000 mg | ORAL_TABLET | Freq: Four times a day (QID) | ORAL | Status: DC | PRN

## 2017-02-05 MED ORDER — BUSPIRONE HCL 5 MG PO TABS
7.5000 mg | ORAL_TABLET | Freq: Three times a day (TID) | ORAL | Status: DC
  Administered 2017-02-05 – 2017-02-06 (×2): 7.5 mg via ORAL
  Filled 2017-02-05 (×2): qty 2

## 2017-02-05 MED ORDER — INSULIN ASPART 100 UNIT/ML ~~LOC~~ SOLN
0.0000 [IU] | Freq: Three times a day (TID) | SUBCUTANEOUS | Status: DC
  Administered 2017-02-06 (×2): 3 [IU] via SUBCUTANEOUS

## 2017-02-05 MED ORDER — AZITHROMYCIN 500 MG IV SOLR
500.0000 mg | INTRAVENOUS | Status: DC
  Administered 2017-02-05: 500 mg via INTRAVENOUS
  Filled 2017-02-05 (×2): qty 500

## 2017-02-05 MED ORDER — METHYLPREDNISOLONE SODIUM SUCC 125 MG IJ SOLR
80.0000 mg | Freq: Three times a day (TID) | INTRAMUSCULAR | Status: DC
  Administered 2017-02-05 – 2017-02-06 (×2): 80 mg via INTRAVENOUS
  Filled 2017-02-05 (×2): qty 2

## 2017-02-05 MED ORDER — ALBUTEROL SULFATE (2.5 MG/3ML) 0.083% IN NEBU
2.5000 mg | INHALATION_SOLUTION | Freq: Four times a day (QID) | RESPIRATORY_TRACT | Status: DC
  Administered 2017-02-05: 2.5 mg via RESPIRATORY_TRACT
  Filled 2017-02-05: qty 3

## 2017-02-05 MED ORDER — ALBUTEROL SULFATE (2.5 MG/3ML) 0.083% IN NEBU: 3.0000 mL | INHALATION_SOLUTION | RESPIRATORY_TRACT | Status: DC | PRN

## 2017-02-05 MED ORDER — ALBUTEROL SULFATE (2.5 MG/3ML) 0.083% IN NEBU: 3.0000 mL | INHALATION_SOLUTION | Freq: Four times a day (QID) | RESPIRATORY_TRACT | Status: DC | PRN

## 2017-02-05 MED ORDER — ACETAMINOPHEN 650 MG RE SUPP: 650.0000 mg | Freq: Four times a day (QID) | RECTAL | Status: DC | PRN

## 2017-02-05 MED ORDER — IPRATROPIUM-ALBUTEROL 0.5-2.5 (3) MG/3ML IN SOLN
3.0000 mL | Freq: Once | RESPIRATORY_TRACT | Status: AC
  Administered 2017-02-05: 3 mL via RESPIRATORY_TRACT
  Filled 2017-02-05: qty 3

## 2017-02-05 MED ORDER — GLIPIZIDE ER 5 MG PO TB24
5.0000 mg | ORAL_TABLET | Freq: Every day | ORAL | Status: DC
  Administered 2017-02-06: 5 mg via ORAL
  Filled 2017-02-05: qty 1

## 2017-02-05 MED ORDER — TRAZODONE HCL 50 MG PO TABS: 50.0000 mg | ORAL_TABLET | Freq: Every evening | ORAL | Status: DC | PRN

## 2017-02-05 MED ORDER — MOMETASONE FURO-FORMOTEROL FUM 100-5 MCG/ACT IN AERO
2.0000 | INHALATION_SPRAY | Freq: Two times a day (BID) | RESPIRATORY_TRACT | Status: DC
  Administered 2017-02-05 – 2017-02-06 (×2): 2 via RESPIRATORY_TRACT
  Filled 2017-02-05: qty 8.8

## 2017-02-05 MED ORDER — INSULIN ASPART 100 UNIT/ML ~~LOC~~ SOLN: 0.0000 [IU] | Freq: Every day | SUBCUTANEOUS | Status: DC

## 2017-02-05 NOTE — ED Triage Notes (Signed)
Patient by EMS from home. Patient states SOB today. States took nebulizer treatment with no relief before calling EMS.

## 2017-02-05 NOTE — Telephone Encounter (Signed)
Has been having a flare of COPD and is SOB. Requesting a refill of prednisone

## 2017-02-05 NOTE — Telephone Encounter (Signed)
Matthew Molina is asking for a refill on Prednisone cant breathe well please advise?

## 2017-02-05 NOTE — ED Notes (Signed)
Patient transported to X-ray 

## 2017-02-05 NOTE — ED Provider Notes (Signed)
[facility] DEPT Provider Note   CSN: [id number] Arrival date & time: 02/05/17  1512     History   Chief Complaint Chief Complaint  Patient presents with  . Shortness of Breath    HPI Matthew Molina is a 66 y.o. male.  HPI 66 year old male who presents with shortness of breath. History of COPD, DM, HTN, HLD and CKD.  Gradual onset of cough productive of clear sputum starting 3-4 days ago associated progressively worsening Shortness of breath that is consistent with his typical COPD exacerbations. Denies any sick contacts. Denies fever, chills, chest pain, significant leg swelling, calf tenderness, orthopnea or PND. Does have home inhalers, which is used, without significant improvement.   Past Medical History:  Diagnosis Date  . Acute kidney injury ([place])   . Acute respiratory failure with hypoxia ([place])   . Arthritis   . COPD (chronic obstructive pulmonary disease) ([place])   . Depression   . Diabetes mellitus   . Diabetes mellitus without complication ([place])   . Hypercholesterolemia   . Hyperlipidemia   . Hypertension   . Multiple lung nodules on CT 04/02/2015  . Nicotine addiction   . Obesity   . Oxygen deficiency    qhs  . Schizophrenia [facility])     Patient Active Problem List   Diagnosis Date Noted  . COPD exacerbation ([place]) 02/05/2017  . Elevated PSA 10/01/2016  . External hemorrhoid, thrombosed 08/24/2016  . GERD (gastroesophageal reflux disease) 08/24/2016  . CKD (chronic kidney disease) stage 3, GFR 30-59 ml/min 05/22/2016  . Dysphagia 01/05/2016  . Insomnia 01/05/2016  . COPD (chronic obstructive pulmonary disease) ([place]) 11/05/2015  . Hypertension 11/05/2015  . Tobacco abuse 07/24/2015  . Coronary atherosclerosis of native coronary artery 04/02/2015  . Multiple lung nodules on CT 04/02/2015  . GAD (generalized anxiety disorder) 07/04/2012  . Back pain with radiation 11/01/2011  . Diabetes mellitus type 2 in obese ([place]) 04/17/2011  . Hyperlipidemia  06/02/2008  . Obesity (BMI 30.0-34.9) 06/02/2008  . Schizophrenia, unspecified type ([place]) 06/02/2008    Past Surgical History:  Procedure Laterality Date  . CATARACT EXTRACTION W/PHACO Left 11/20/2013   Procedure: CATARACT EXTRACTION PHACO AND INTRAOCULAR LENS PLACEMENT (IOC);  Surgeon: Tonny Branch, MD;  Location: [facility] ORS;  Service: Ophthalmology;  Laterality: Left;  CDE:10.26  . CATARACT EXTRACTION W/PHACO Right 12/08/2013   Procedure: RIGHT EYE CATARACT EXTRACTION PHACO AND INTRAOCULAR LENS PLACEMENT ;  Surgeon: Tonny Branch, MD;  Location: [facility] ORS;  Service: Ophthalmology;  Laterality: Right;  CDE 12.38  . COLONOSCOPY N/A 04/01/2013   Procedure: COLONOSCOPY;  Surgeon: Danie Binder, MD;  Location: [facility] ENDO SUITE;  Service: Endoscopy;  Laterality: N/A;  10:00 AM-moved to [place] notified pt  . COLONOSCOPY  2014   INCOMPLETE PREP IN R COLON  . ESOPHAGOGASTRODUODENOSCOPY N/A 02/18/2016   Procedure: ESOPHAGOGASTRODUODENOSCOPY (EGD);  Surgeon: Danie Binder, MD;  Location: [facility] ENDO SUITE;  Service: Endoscopy;  Laterality: N/A;  1145  . EYE SURGERY Left 11/2013   cataract extraction  . SAVORY DILATION N/A 02/18/2016   Procedure: SAVORY DILATION;  Surgeon: Danie Binder, MD;  Location: [facility] ENDO SU[facility];  Service: Endoscopy;  Laterality: N/A;  . SHOULDER SURGERY         Home Medications    Prior to Admission medications   Medication Sig Start Date End Date Taking? Authorizing Provider  ACCU-CHEK SOFTCLIX LANCETS lancets TEST BLOOD SUGAR ONCE DAILY AS DIRECTED. 07/26/16   Fayrene Helper, MD  acetaminophen (  TYLENOL) 500 MG tablet Take 500 mg by mouth daily as needed.    Historical Provider, MD  albuterol (PROVENTIL HFA;VENTOLIN HFA) 108 (90 BASE) MCG/ACT inhaler Inhale 2 puffs into the lungs every 6 (six) hours as needed for wheezing or shortness of breath. 07/29/15   Ezequiel Essex, MD  albuterol (PROVENTIL HFA;VENTOLIN HFA) 108 (90 Base) MCG/ACT inhaler Inhale 2 puffs into the lungs every 4  (four) hours as needed for wheezing or shortness of breath. 01/06/17   Lajean Saver, MD  aspirin EC 81 MG tablet Take 81 mg by mouth daily.    Historical Provider, MD  busPIRone (BUSPAR) 7.5 MG tablet Take 1 tablet (7.5 mg total) by mouth 3 (three) times daily. 12/28/16   Fayrene Helper, MD  doxycycline (VIBRAMYCIN) 100 MG capsule Take 1 capsule (100 mg total) by mouth 2 (two) times daily. 01/06/17   Lajean Saver, MD  glipiZIDE (GLUCOTROL XL) 5 MG 24 hr tablet Take 1 tablet (5 mg total) by mouth daily with breakfast. 01/10/17   Fayrene Helper, MD  glucose blood (ACCU-CHEK AVIVA PLUS) test strip Once daily testing E11.9, E66.9 09/12/16   Fayrene Helper, MD  ipratropium-albuterol (DUONEB) 0.5-2.5 (3) MG/3ML SOLN INHALE 1 VIAL VIA NEBULIZER EVERY FOUR HOURS AS NEEDED. 03/02/16   Fayrene Helper, MD  omeprazole (PRILOSEC) 20 MG capsule 1 PO 30 mins prior to breakfast and supper 02/18/16   Danie Binder, MD  polyethylene glycol powder (GLYCOLAX/MIRALAX) powder MIX 1 CAPFUL (17G) IN 8 OUNCES OF JUICE/WATER AND DRINK ONCE DAILY. 12/23/15   Fayrene Helper, MD  pravastatin (PRAVACHOL) 20 MG tablet Take 1 tablet (20 mg total) by mouth daily. NEW DOSE 10/02/16   Fayrene Helper, MD  predniSONE (DELTASONE) 20 MG tablet Take 3 tablets (60 mg total) by mouth daily. 01/06/17   Lajean Saver, MD  predniSONE (DELTASONE) 20 MG tablet Take 1 tablet (20 mg total) by mouth 2 (two) times daily with a meal. 02/05/17   Raylene Everts, MD  risperiDONE (RISPERDAL) 1 MG tablet TAKE (1) TABLET BY MOUTH AT BEDTIME WITH 4MG . 01/22/17   Fayrene Helper, MD  risperidone (RISPERDAL) 4 MG tablet TAKE (1) TABLET BY MOUTH AT BEDTIME. 12/22/16   Fayrene Helper, MD  SPIRIVA HANDIHALER 18 MCG inhalation capsule INHALE 1 CAPSULE AS DIRECTED ONCE A DAY. 02/22/15   Fayrene Helper, MD  SYMBICORT 80-4.5 MCG/ACT inhaler INHALE 2 PUFFS INTO LUNGS TWICE DAILY. 11/20/16   Fayrene Helper, MD  traZODone (DESYREL) 50 MG tablet  TAKE ONE TABLET BY MOUTH AT BEDTIME AS NEEDED FOR SLEEP. 01/26/17   Fayrene Helper, MD  triamterene-hydrochlorothiazide [facility]) 37.5-25 MG tablet Half tablet once daily 12/28/16   Fayrene Helper, MD  valsartan (DIOVAN) 320 MG tablet TAKE ONE TABLET BY MOUTH DAILY. 01/22/17   Fayrene Helper, MD    Family History Family History  Problem Relation Age of Onset  . Diabetes Mother   . Hypertension Mother   . Stroke Mother   . Diabetes Sister   . Heart disease Sister   . Kidney disease Father   . Colon cancer Neg Hx   . Colon polyps Neg Hx     Social History Social History  Substance Use Topics  . Smoking status: Current Every Day Smoker    Packs/day: 1.50    Types: Cigarettes  . Smokeless tobacco: Never Used  . Alcohol use No     Allergies   Sertraline hcl and Wellbutrin [  bupropion]   Review of Systems Review of Systems 10/14 systems reviewed and are negative other than those stated in the HPI   Physical Exam Updated Vital Signs BP 139/94 (BP Location: Right Arm)   Pulse 95   Temp 98.1 F (36.7 C) (Oral)   Resp 20   Ht 6\' 1"  (1.854 m)   Wt 220 lb (99.8 kg)   SpO2 94%   BMI 29.03 kg/m   Physical Exam Physical Exam  Nursing note and vitals reviewed. Constitutional: Well developed, well nourished, non-toxic, and in no acute distress Head: Normocephalic and atraumatic.  Mouth/Throat: Oropharynx is clear and moist.  Neck: Normal range of motion. Neck supple.  Cardiovascular: Normal rate and regular rhythm.   Pulmonary/Chest: Effort normal. No conversational dyspnea. Expiratory wheezing in all lung fields Abdominal: Soft. There is no tenderness. There is no rebound and no guarding.  Musculoskeletal: Normal range of motion. no edema. No calf tenderness Neurological: Alert, no facial droop, fluent speech, moves all extremities symmetrically Skin: Skin is warm and dry.  Psychiatric: Cooperative   ED Treatments / Results  Labs (all labs ordered are  listed, but only abnormal results are displayed) Labs Reviewed  CBC WITH DIFFERENTIAL/PLATELET - Abnormal; Notable for the following:       Result Value   Eosinophils Absolute 0.8 (*)    All other components within normal limits  BASIC METABOLIC PANEL - Abnormal; Notable for the following:    Sodium 133 (*)    Potassium 3.3 (*)    Chloride 97 (*)    Creatinine, Ser 1.35 (*)    GFR calc non Af Amer 53 (*)    All other components within normal limits  I-STAT TROPOININ, ED    EKG  EKG Interpretation  Date/Time:  Monday February 05 2017 15:33:29 EST Ventricular Rate:  98 PR Interval:    QRS Duration: 78 QT Interval:  350 QTC Calculation: 447 R Axis:   -69 Text Interpretation:  Sinus rhythm Atrial premature complex Left anterior fascicular block Abnormal R-wave progression, late transition No acute changes  Confirmed by Orry Sigl MD, [place]one [phone]) on 02/05/2017 4:03:19 PM       Radiology Dg Chest 2 View  Result Date: 02/05/2017 CLINICAL DATA:  Shortness of breath and productive cough for the past 3 days. History of COPD, diabetes, coronary artery disease, current smoker. EXAM: CHEST  2 VIEW COMPARISON:  Chest x-ray of January 06, 2017 and chest CT scan of January 11, 2017. FINDINGS: The lungs are well-expanded. There is no focal infiltrate. There is no pleural effusion. The heart and pulmonary vascularity are normal. The mediastinum is normal in width. There is no pleural effusion. The bony thorax exhibits no acute abnormality. IMPRESSION: COPD.  No pneumonia, CHF, nor other acute cardiopulmonary disease. Electronically Signed   By: David  Martinique M.D.   On: 02/05/2017 16:23    Procedures Procedures (including critical care time)  Medications Ordered in ED Medications  predniSONE (DELTASONE) tablet 60 mg (60 mg Oral Given 02/05/17 1605)  ipratropium-albuterol (DUONEB) 0.5-2.5 (3) MG/3ML nebulizer solution 3 mL (3 mLs Nebulization Given 02/05/17 1640)  potassium chloride SA  (K-DUR,KLOR-CON) CR tablet 20 mEq (20 mEq Oral Given 02/05/17 1706)  ipratropium-albuterol (DUONEB) 0.5-2.5 (3) MG/3ML nebulizer solution 3 mL (3 mLs Nebulization Given 02/05/17 1719)     Initial Impression / Assessment and Plan / ED Course  I have reviewed the triage vital signs and the nursing notes.  Pertinent labs & imaging results that were available during my  care of the patient were reviewed by me and considered in my medical decision making (see chart for details).     Presenting with COPD exacerbation in setting of potential URI symptoms. Well appearing. On room air, speaking in full sentences. Diffuse expiratory wheezing on lung exam.With 2 L oxygen requirement. Given duoneb x 3 and prednisone. Improving symptoms, but still having some shortness of breath.  CXR Visualized and does not show pneumonia, edema, or other acute cardiopulmonary processes. Given ongoing wheezing and dyspnea, discussed with Dr. Maudie Mercury who will admit for observation.  Final Clinical Impressions(s) / ED Diagnoses   Final diagnoses:  Acute exacerbation of chronic obstructive pulmonary disease (COPD) ([place])    New Prescriptions New Prescriptions   No medications on file     Forde Dandy, MD 02/05/17 1815

## 2017-02-05 NOTE — Telephone Encounter (Signed)
He may have prednisone 20 mg BID for 5 days

## 2017-02-05 NOTE — H&P (Signed)
[facility] H&P   Patient Demographics:    Matthew Molina, is a 66 y.o. male  MRN: [id number]   DOB - July 08, 1951  Admit Date - 02/05/2017  Outpatient Primary MD for the patient is [name], MD  Referring MD/NP/PA: Dr. Oleta Mouse  Outpatient Specialists:     Patient coming from:  home  Chief Complaint  Patient presents with  . Shortness of Breath      HPI:    Matthew Molina  is a 66 y.o. male, w Copd on home o2, apparently c/o coughing x5 days,  w white sputum. slight increase in dyspnea, and wheezing.   Denies fever, chills, myalgia, cp, palp, n/v, diarrhea, brbpr.  Pt tried nebulizer and inhalers at home without benefit and therefore presented to ED for evaluation  In, ED CXR negative. Pox 89% on RA, and potassium low, and pt had mild ARF with bun 9, creatinine 1.35,  Pt wheezing on exam. Pt will be admitted for Copd exacerbation.        Review of systems:    In addition to the HPI above,  No Fever-chills, No Headache, No changes with Vision or hearing, No problems swallowing food or Liquids, No Chest pain,  No Abdominal pain, No Nausea or Vommitting, Bowel movements are regular, No Blood in stool or Urine, No dysuria, No new skin rashes or bruises, No new joints pains-aches,  No new weakness, tingling, numbness in any extremity, No recent weight gain or loss, No polyuria, polydypsia or polyphagia, No significant Mental Stressors.  A full 10 point Review of Systems was done, except as stated above, all other Review of Systems were negative.   With Past History of the following :    Past Medical History:  Diagnosis Date  . Acute kidney injury ([place])   . Acute respiratory failure with hypoxia ([place])   . Arthritis   . COPD (chronic obstructive pulmonary disease) ([place])   . Depression   . Diabetes mellitus   . Diabetes mellitus without complication ([place])   .  Hypercholesterolemia   . Hyperlipidemia   . Hypertension   . Multiple lung nodules on CT 04/02/2015  . Nicotine addiction   . Obesity   . Oxygen deficiency    qhs  . Schizophrenia [facility])       Past Surgical History:  Procedure Laterality Date  . CATARACT EXTRACTION W/PHACO Left 11/20/2013   Procedure: CATARACT EXTRACTION PHACO AND INTRAOCULAR LENS PLACEMENT (IOC);  Surgeon: Tonny Branch, MD;  Location: [facility] ORS;  Service: Ophthalmology;  Laterality: Left;  CDE:10.26  . CATARACT EXTRACTION W/PHACO Right 12/08/2013   Procedure: RIGHT EYE CATARACT EXTRACTION PHACO AND INTRAOCULAR LENS PLACEMENT ;  Surgeon: Tonny Branch, MD;  Location: [facility] ORS;  Service: Ophthalmology;  Laterality: Right;  CDE 12.38  . COLONOSCOPY N/A 04/01/2013   Procedure: COLONOSCOPY;  Surgeon: Danie Binder, MD;  Location[facility];  Service: Endoscopy;  Laterality: N/A;  10:00 AM-moved to [place] notified pt  . COLONOSCOPY  2014   INCOMPLETE PREP IN R COLON  . ESOPHAGOGASTRODUODENOSCOPY N/A 02/18/2016   Procedure: ESOPHAGOGASTRODUODENOSCOPY (EGD);  Surgeon: Danie Binder, MD;  Location[facility];  Service: Endoscopy;  Laterality: N/A;  1145  . EYE SURGERY Left 11/2013   cataract extraction  . SAVORY DILATION N/A 02/18/2016   Procedure: SAVORY DILATION;  Surgeon: Danie Binder, MD;  Location: [facility];  Service: Endoscopy;  Laterality: N/A;  . SHOULDER SURGERY        Social History:     Social History  Substance Use Topics  . Smoking status: Current Every Day Smoker    Packs/day: 1.50    Types: Cigarettes  . Smokeless tobacco: Never Used  . Alcohol use No     Lives - at home.    Mobility - able to walk     Family History :     Family History  Problem Relation Age of Onset  . Diabetes Mother   . Hypertension Mother   . Stroke Mother   . Diabetes Sister   . Heart disease Sister   . Kidney disease Father   . Colon cancer Neg Hx   . Colon polyps Neg Hx       Home Medications:    Prior to Admission medications   Medication Sig Start Date End Date Taking? Authorizing Provider  ACCU-CHEK SOFTCLIX LANCETS lancets TEST BLOOD SUGAR ONCE DAILY AS DIRECTED. 07/26/16   Fayrene Helper, MD  acetaminophen (TYLENOL) 500 MG tablet Take 500 mg by mouth daily as needed.    Historical Provider, MD  albuterol (PROVENTIL HFA;VENTOLIN HFA) 108 (90 BASE) MCG/ACT inhaler Inhale 2 puffs into the lungs every 6 (six) hours as needed for wheezing or shortness of breath. 07/29/15   Ezequiel Essex, MD  albuterol (PROVENTIL HFA;VENTOLIN HFA) 108 (90 Base) MCG/ACT inhaler Inhale 2 puffs into the lungs every 4 (four) hours as needed for wheezing or shortness of breath. 01/06/17   Lajean Saver, MD  aspirin EC 81 MG tablet Take 81 mg by mouth daily.    Historical Provider, MD  busPIRone (BUSPAR) 7.5 MG tablet Take 1 tablet (7.5 mg total) by mouth 3 (three) times daily. 12/28/16   Fayrene Helper, MD  doxycycline (VIBRAMYCIN) 100 MG capsule Take 1 capsule (100 mg total) by mouth 2 (two) times daily. 01/06/17   Lajean Saver, MD  glipiZIDE (GLUCOTROL XL) 5 MG 24 hr tablet Take 1 tablet (5 mg total) by mouth daily with breakfast. 01/10/17   Fayrene Helper, MD  glucose blood (ACCU-CHEK AVIVA PLUS) test strip Once daily testing E11.9, E66.9 09/12/16   Fayrene Helper, MD  ipratropium-albuterol (DUONEB) 0.5-2.5 (3) MG/3ML SOLN INHALE 1 VIAL VIA NEBULIZER EVERY FOUR HOURS AS NEEDED. 03/02/16   Fayrene Helper, MD  omeprazole (PRILOSEC) 20 MG capsule 1 PO 30 mins prior to breakfast and supper 02/18/16   Danie Binder, MD  polyethylene glycol powder (GLYCOLAX/MIRALAX) powder MIX 1 CAPFUL (17G) IN 8 OUNCES OF JUICE/WATER AND DRINK ONCE DAILY. 12/23/15   Fayrene Helper, MD  pravastatin (PRAVACHOL) 20 MG tablet Take 1 tablet (20 mg total) by mouth daily. NEW DOSE 10/02/16   Fayrene Helper, MD  predniSONE (DELTASONE) 20 MG tablet Take 3 tablets (60 mg total) by mouth daily. 01/06/17   Lajean Saver, MD   predniSONE (DELTASONE) 20 MG tablet Take 1 tablet (20  mg total) by mouth 2 (two) times daily with a meal. 02/05/17   Raylene Everts, MD  risperiDONE (RISPERDAL) 1 MG tablet TAKE (1) TABLET BY MOUTH AT BEDTIME WITH 4MG . 01/22/17   Fayrene Helper, MD  risperidone (RISPERDAL) 4 MG tablet TAKE (1) TABLET BY MOUTH AT BEDTIME. 12/22/16   Fayrene Helper, MD  SPIRIVA HANDIHALER 18 MCG inhalation capsule INHALE 1 CAPSULE AS DIRECTED ONCE A DAY. 02/22/15   Fayrene Helper, MD  SYMBICORT 80-4.5 MCG/ACT inhaler INHALE 2 PUFFS INTO LUNGS TWICE DAILY. 11/20/16   Fayrene Helper, MD  traZODone (DESYREL) 50 MG tablet TAKE ONE TABLET BY MOUTH AT BEDTIME AS NEEDED FOR SLEEP. 01/26/17   Fayrene Helper, MD  triamterene-hydrochlorothiazide [facility]) 37.5-25 MG tablet Half tablet once daily 12/28/16   Fayrene Helper, MD  valsartan (DIOVAN) 320 MG tablet TAKE ONE TABLET BY MOUTH DAILY. 01/22/17   Fayrene Helper, MD     Allergies:     Allergies  Allergen Reactions  . Sertraline Hcl     Stomach upset/pain  . Wellbutrin [Bupropion] Other (See Comments)    Makes stomach hurt     Physical Exam:   Vitals  Blood pressure 131/84, pulse 105, temperature 98.1 F (36.7 C), temperature source Oral, resp. rate 26, height 6\' 1"  (1.854 m), weight 99.8 kg (220 lb), SpO2 92 %.   1. General  lying in bed in NAD,    2. Normal affect and insight, Not Suicidal or Homicidal, Awake Alert, Oriented X 3.  3. No F.N deficits, ALL C.Nerves Intact, Strength 5/5 all 4 extremities, Sensation intact all 4 extremities, Plantars down going.  4. Ears and Eyes appear Normal, Conjunctivae clear, PERRLA. Moist Oral Mucosa.  5. Supple Neck, No JVD, No cervical lymphadenopathy appriciated, No Carotid Bruits.  6. Symmetrical Chest wall movement,  Slight tightness,  + wheezing bilateral lung fields.  No crackles.    7. RRR, No Gallops, Rubs or Murmurs, No Parasternal Heave.  8. Positive Bowel Sounds, Abdomen Soft,  No tenderness, No organomegaly appriciated,No rebound -guarding or rigidity.  9.  No Cyanosis, Normal Skin Turgor, No Skin Rash or Bruise.  10. Good muscle tone,  joints appear normal , no effusions, Normal ROM.  11. No Palpable Lymph Nodes in Neck or Axillae     Data Review:    CBC  Recent Labs Lab 02/05/17 1537  WBC 6.7  HGB 13.5  HCT 40.9  PLT 263  MCV 87.2  MCH 28.8  MCHC 33.0  RDW 14.4  LYMPHSABS 2.3  MONOABS 0.7  EOSABS 0.8*  BASOSABS 0.0   ------------------------------------------------------------------------------------------------------------------  Chemistries   Recent Labs Lab 02/05/17 1537  NA 133*  K 3.3*  CL 97*  CO2 25  GLUCOSE 79  BUN 9  CREATININE 1.35*  CALCIUM 10.0   ------------------------------------------------------------------------------------------------------------------ estimated creatinine clearance is 66 mL/min (by C-G formula based on SCr of 1.35 mg/dL (H)). ------------------------------------------------------------------------------------------------------------------ No results for input(s): TSH, T4TOTAL, T3FREE, THYROIDAB in the last 72 hours.  Invalid input(s): FREET3  Coagulation profile No results for input(s): INR, PROTIME in the last 168 hours. ------------------------------------------------------------------------------------------------------------------- No results for input(s): DDIMER in the last 72 hours. -------------------------------------------------------------------------------------------------------------------  Cardiac Enzymes No results for input(s): CKMB, TROPONINI, MYOGLOBIN in the last 168 hours.  Invalid input(s): CK ------------------------------------------------------------------------------------------------------------------    Component Value Date/Time   BNP 14.0 06/15/2016 0826      ---------------------------------------------------------------------------------------------------------------  Urinalysis    Component Value Date/Time   COLORURINE lt. yellow 02/25/2009 1257  APPEARANCEUR Clear 02/25/2009 1257   LABSPEC 1.010 02/25/2009 1257   PHURINE 6.0 02/25/2009 1257   HGBUR negative 02/25/2009 1257   BILIRUBINUR negative 02/25/2009 1257   UROBILINOGEN 0.2 02/25/2009 1257   NITRITE negative 02/25/2009 1257    ----------------------------------------------------------------------------------------------------------------   Imaging Results:    Dg Chest 2 View  Result Date: 02/05/2017 CLINICAL DATA:  Shortness of breath and productive cough for the past 3 days. History of COPD, diabetes, coronary artery disease, current smoker. EXAM: CHEST  2 VIEW COMPARISON:  Chest x-ray of January 06, 2017 and chest CT scan of January 11, 2017. FINDINGS: The lungs are well-expanded. There is no focal infiltrate. There is no pleural effusion. The heart and pulmonary vascularity are normal. The mediastinum is normal in width. There is no pleural effusion. The bony thorax exhibits no acute abnormality. IMPRESSION: COPD.  No pneumonia, CHF, nor other acute cardiopulmonary disease. Electronically Signed   By: David  Martinique M.D.   On: 02/05/2017 16:23      Assessment & Plan:    Principal Problem:   COPD exacerbation ([place]) Active Problems:   Diabetes mellitus type 2 in obese ([place])   Tobacco abuse   Hypokalemia   ARF (acute renal failure) ([place])    1. Copd exacerbation Start solumedrol 80mg  iv q8h Start zithromax 500mg  iv qday Cont spiriva 1puff qday Cont symbicort=> dulera due to formulary Albuterol neb 1 neb po qid, and prn  2. ARF Likely secondary to dehydration Start ns iv  3. Dm2 fsbs ac and qhs, iss Cont glipizide  4. Hypokalemia ? Secondary to maxide Replete Check cmp in am.   5. Hypertension Cont current bp medications.      DVT Prophylaxis  Heparin -  Lovenox - SCDs   AM Labs Ordered, also please review Full Orders  Family Communication: Admission, patients condition and plan of care including tests being ordered have been discussed with the patient  who indicate understanding and agree with the plan and Code Status.  Code Status  FULL CODE  Likely DC to  home  Condition GUARDED    Consults called: none  Admission status: observation   Time spent in minutes : 45 minutes   Jani Gravel M.D on 02/05/2017 at 6:45 PM  Between 7am to 7pm - Pager - [phone] After 7pm go to [email or web address] - password [email or web address]  [facility] - Office  [phone]

## 2017-02-06 DIAGNOSIS — J9611 Chronic respiratory failure with hypoxia: Secondary | ICD-10-CM

## 2017-02-06 DIAGNOSIS — E1169 Type 2 diabetes mellitus with other specified complication: Secondary | ICD-10-CM

## 2017-02-06 DIAGNOSIS — E871 Hypo-osmolality and hyponatremia: Secondary | ICD-10-CM | POA: Diagnosis not present

## 2017-02-06 DIAGNOSIS — R0902 Hypoxemia: Secondary | ICD-10-CM | POA: Diagnosis not present

## 2017-02-06 DIAGNOSIS — N183 Chronic kidney disease, stage 3 (moderate): Secondary | ICD-10-CM

## 2017-02-06 DIAGNOSIS — J9610 Chronic respiratory failure, unspecified whether with hypoxia or hypercapnia: Secondary | ICD-10-CM

## 2017-02-06 DIAGNOSIS — E669 Obesity, unspecified: Secondary | ICD-10-CM | POA: Diagnosis not present

## 2017-02-06 DIAGNOSIS — J441 Chronic obstructive pulmonary disease with (acute) exacerbation: Secondary | ICD-10-CM

## 2017-02-06 LAB — COMPREHENSIVE METABOLIC PANEL
ALT: 24 U/L (ref 17–63)
AST: 29 U/L (ref 15–41)
Albumin: 4 g/dL (ref 3.5–5.0)
Alkaline Phosphatase: 51 U/L (ref 38–126)
Anion gap: 11 (ref 5–15)
BUN: 13 mg/dL (ref 6–20)
CO2: 22 mmol/L (ref 22–32)
Calcium: 10 mg/dL (ref 8.9–10.3)
Chloride: 96 mmol/L — ABNORMAL LOW (ref 101–111)
Creatinine, Ser: 1.45 mg/dL — ABNORMAL HIGH (ref 0.61–1.24)
GFR calc Af Amer: 56 mL/min — ABNORMAL LOW (ref >60)
GFR calc non Af Amer: 48 mL/min — ABNORMAL LOW (ref >60)
Glucose, Bld: 215 mg/dL — ABNORMAL HIGH (ref 65–99)
Potassium: 4.2 mmol/L (ref 3.5–5.1)
Sodium: 129 mmol/L — ABNORMAL LOW (ref 135–145)
Total Bilirubin: 0.6 mg/dL (ref 0.3–1.2)
Total Protein: 7.9 g/dL (ref 6.5–8.1)

## 2017-02-06 LAB — GLUCOSE, CAPILLARY
Glucose-Capillary: 239 mg/dL — ABNORMAL HIGH (ref 65–99)
Glucose-Capillary: 208 mg/dL — ABNORMAL HIGH (ref 65–99)

## 2017-02-06 LAB — CBC
HCT: 40.3 % (ref 39.0–52.0)
Hemoglobin: 13.4 g/dL (ref 13.0–17.0)
MCH: 28.9 pg (ref 26.0–34.0)
MCHC: 33.3 g/dL (ref 30.0–36.0)
MCV: 86.9 fL (ref 78.0–100.0)
Platelets: 274 10*3/uL (ref 150–400)
RBC: 4.64 MIL/uL (ref 4.22–5.81)
RDW: 14.2 % (ref 11.5–15.5)
WBC: 6.4 10*3/uL (ref 4.0–10.5)

## 2017-02-06 MED ORDER — PREDNISONE 20 MG PO TABS
40.0000 mg | ORAL_TABLET | Freq: Every day | ORAL | Status: DC
  Administered 2017-02-06: 40 mg via ORAL
  Filled 2017-02-06: qty 2

## 2017-02-06 MED ORDER — AZITHROMYCIN 250 MG PO TABS: 500.0000 mg | ORAL_TABLET | Freq: Every day | ORAL | Status: DC

## 2017-02-06 MED ORDER — PREDNISONE 10 MG PO TABS: ORAL_TABLET | ORAL | 0 refills | Status: DC

## 2017-02-06 MED ORDER — AZITHROMYCIN 500 MG PO TABS: 500.0000 mg | ORAL_TABLET | Freq: Every day | ORAL | 0 refills | Status: DC

## 2017-02-06 NOTE — Progress Notes (Signed)
IV removed, WNL. D/C instructions given to pt. Verbalized understanding. Pt awaiting ride home. 

## 2017-02-06 NOTE — Telephone Encounter (Signed)
Patient admitted to [facility]

## 2017-02-06 NOTE — Discharge Summary (Addendum)
Physician Discharge Summary  Matthew Molina [id number] DOB: 14-Feb-1951 DOA: 02/05/2017  PCP: [name], MD  Admit date: 02/05/2017 Discharge date: 02/06/2017  Recommendations for Outpatient Follow-up:  1. Follow-up resolution of COPD exacerbation 2. Follow-up chronic hyponatremia of unclear etiology  Follow-up Information    [name], MD On 02/20/2017.   Specialty:  Family Medicine Why:  at 10:00 am Contact information: [address], [place] [address] [phone]          Discharge Diagnoses:  1. COPD exacerbation 2. Chronic kidney disease stage III 3. Chronic hyponatremia 4. Diabetes mellitus type 2  Discharge Condition: improved Disposition: home  Diet recommendation: Heart healthy  Filed Weights   02/05/17 1538 02/06/17 0340  Weight: 99.8 kg (220 lb) 100.6 kg (221 lb 12.5 oz)    History of present illness:  66 year old man PMH COPD oxygen dependent, presented with cough, shortness of breath. Admitted for COPD exacerbation.  Hospital Course:  Patient was observed overnight, treated with steroids, bronchodilators, empiric antibiotics with rapid clinical improvement. Anticipate continued improvement on oral antibiotics and steroids, stable for discharge. Hospitalization was uncomplicated.  1. COPD exacerbation, rapidly improving. Chest x-ray no acute disease. No evidence of complicating features. 2. Chronic hypoxic respiratory failure, stable. Continue oxygen at home. 3. Chronic kidney disease stage III, stable 4. Chronic hyponatremia, appears asymptomatic.  5. Diabetes mellitus type 2, stable.  Today  Subjective: Feels 70-80 percent better. Breathing better.  Objective: Temperature 97.7, respirations 18, pulse 102, blood pressure 125/83. SPO2 94% on 3 L.  General. Appears calm, comfortable. Ambulates without difficulty.  Respiratory. Fair air movement. No rhonchi or rales. Mild wheezing. Speaks in full  sentences.  Cardiovascular. Regular rate and rhythm. No murmur, rub or gallop.  Psychiatric. Grossly normal mood and affect. Speech fluent and appropriate.  Sodium 129, chronic. Potassium 4.2. Remainder CMP unremarkable. CBC unremarkable. Chest x-ray no acute disease. EKG sinus rhythm.  Discharge Instructions  Discharge Instructions    Diet - low sodium heart healthy    Complete by:  As directed    Discharge instructions    Complete by:  As directed    Call your physician or seek immediate medical attention for wheezing, shortness of breath or worsening of condition.   Increase activity slowly    Complete by:  As directed      Allergies as of 02/06/2017      Reactions   Sertraline Hcl    Stomach upset/pain   Wellbutrin [bupropion] Other (See Comments)   Makes stomach hurt      Medication List    TAKE these medications   ACCU-CHEK SOFTCLIX LANCETS lancets TEST BLOOD SUGAR ONCE DAILY AS DIRECTED.   acetaminophen 500 MG tablet Commonly known as:  TYLENOL Take 500 mg by mouth daily as needed.   albuterol 108 (90 Base) MCG/ACT inhaler Commonly known as:  PROVENTIL HFA;VENTOLIN HFA Inhale 2 puffs into the lungs every 6 (six) hours as needed for wheezing or shortness of breath.   aspirin EC 81 MG tablet Take 81 mg by mouth daily.   azithromycin 500 MG tablet Commonly known as:  ZITHROMAX Take 1 tablet (500 mg total) by mouth daily. Take in the evening. Deliver to patient's house.   busPIRone 7.5 MG tablet Commonly known as:  BUSPAR Take 1 tablet (7.5 mg total) by mouth 3 (three) times daily.   glipiZIDE 5 MG 24 hr tablet Commonly known as:  GLUCOTROL XL Take 1 tablet (5 mg total) by mouth daily  with breakfast.   glucose blood test strip Commonly known as:  ACCU-CHEK AVIVA PLUS Once daily testing E11.9, E66.9   ipratropium-albuterol 0.5-2.5 (3) MG/3ML Soln Commonly known as:  DUONEB INHALE 1 VIAL VIA NEBULIZER EVERY FOUR HOURS AS NEEDED.   omeprazole 20 MG  capsule Commonly known as:  PRILOSEC 1 PO 30 mins prior to breakfast and supper What changed:  how much to take  how to take this  when to take this  additional instructions   polyethylene glycol powder powder Commonly known as:  GLYCOLAX/MIRALAX MIX 1 CAPFUL (17G) IN 8 OUNCES OF JUICE/WATER AND DRINK ONCE DAILY.   pravastatin 20 MG tablet Commonly known as:  PRAVACHOL Take 1 tablet (20 mg total) by mouth daily. NEW DOSE   predniSONE 10 MG tablet Commonly known as:  DELTASONE Take daily by mouth: 40 mg x3 days, then 20 mg x3 days, then 10 mg x3 days, then stop. Deliver to house. What changed:  medication strength  how much to take  how to take this  when to take this  additional instructions   risperidone 4 MG tablet Commonly known as:  RISPERDAL TAKE (1) TABLET BY MOUTH AT BEDTIME.   risperiDONE 1 MG tablet Commonly known as:  RISPERDAL TAKE (1) TABLET BY MOUTH AT BEDTIME WITH 4MG .   SPIRIVA HANDIHALER 18 MCG inhalation capsule Generic drug:  tiotropium INHALE 1 CAPSULE AS DIRECTED ONCE A DAY.   SYMBICORT 80-4.5 MCG/ACT inhaler Generic drug:  budesonide-formoterol INHALE 2 PUFFS INTO LUNGS TWICE DAILY.   traZODone 50 MG tablet Commonly known as:  DESYREL TAKE ONE TABLET BY MOUTH AT BEDTIME AS NEEDED FOR SLEEP.   triamterene-hydrochlorothiazide 37.5-25 MG tablet Commonly known as:  MAXZIDE-25 Half tablet once daily What changed:  how much to take  how to take this  when to take this  additional instructions   valsartan 320 MG tablet Commonly known as:  DIOVAN TAKE ONE TABLET BY MOUTH DAILY.      Allergies  Allergen Reactions  . Sertraline Hcl     Stomach upset/pain  . Wellbutrin [Bupropion] Other (See Comments)    Makes stomach hurt    The results of significant diagnostics from this hospitalization (including imaging, microbiology, ancillary and laboratory) are listed below for reference.    Significant Diagnostic Studies: Dg  Chest 2 View  Result Date: 02/05/2017 CLINICAL DATA:  Shortness of breath and productive cough for the past 3 days. History of COPD, diabetes, coronary artery disease, current smoker. EXAM: CHEST  2 VIEW COMPARISON:  Chest x-ray of January 06, 2017 and chest CT scan of January 11, 2017. FINDINGS: The lungs are well-expanded. There is no focal infiltrate. There is no pleural effusion. The heart and pulmonary vascularity are normal. The mediastinum is normal in width. There is no pleural effusion. The bony thorax exhibits no acute abnormality. IMPRESSION: COPD.  No pneumonia, CHF, nor other acute cardiopulmonary disease. Electronically Signed   By: David  Martinique M.D.   On: 02/05/2017 16:23   Labs: Basic Metabolic Panel:  Recent Labs Lab 02/05/17 1537 02/06/17 0617  NA 133* 129*  K 3.3* 4.2  CL 97* 96*  CO2 25 22  GLUCOSE 79 215*  BUN 9 13  CREATININE 1.35* 1.45*  CALCIUM 10.0 10.0   Liver Function Tests:  Recent Labs Lab 02/06/17 0617  AST 29  ALT 24  ALKPHOS 51  BILITOT 0.6  PROT 7.9  ALBUMIN 4.0   CBC:  Recent Labs Lab 02/05/17 1537 02/06/17 0617  WBC 6.7 6.4  NEUTROABS 2.8  --   HGB 13.5 13.4  HCT 40.9 40.3  MCV 87.2 86.9  PLT 263 274    CBG:  Recent Labs Lab 02/05/17 2122 02/06/17 0729 02/06/17 1141  GLUCAP 189* 239* 208*    Principal Problem:   COPD exacerbation (HCC) Active Problems:   Diabetes mellitus type 2 in obese ([place])   Tobacco abuse   Hypokalemia   ARF (acute renal failure) ([place])   Time coordinating discharge: 35 minutes  Signed:  Murray Hodgkins, MD [facility] 02/06/2017, 5:51 PM

## 2017-02-07 ENCOUNTER — Telehealth: Payer: Self-pay

## 2017-02-07 LAB — HEMOGLOBIN A1C
Hgb A1c MFr Bld: 6.4 % — ABNORMAL HIGH (ref 4.8–5.6)
Mean Plasma Glucose: 137

## 2017-02-07 NOTE — Telephone Encounter (Signed)
Transition Care Management Follow-up Telephone Call   Date discharged? 02/06/2017   How have you been since you were released from the hospital? Much better, SOB has decreased and breathing treatments have been helpful.   Do you understand why you were in the hospital? yes   Do you understand the discharge instructions? yes   Where were you discharged to? Home   Items Reviewed:  Medications reviewed: yes  Allergies reviewed: yes  Dietary changes reviewed: yes  Referrals reviewed: yes    Any transportation issues/concerns?: no   Any patient concerns? no   Confirmed importance and date/time of follow-up visits scheduled yes  Provider Appointment booked with [name] on 02/20/2017 at 10:00 am  Confirmed with patient if condition begins to worsen call PCP or go to the ER.  Patient was given the office number and encouraged to call back with question or concerns.  : yes

## 2017-02-19 ENCOUNTER — Other Ambulatory Visit: Payer: Self-pay | Admitting: Family Medicine

## 2017-02-19 ENCOUNTER — Other Ambulatory Visit: Payer: Self-pay | Admitting: Gastroenterology

## 2017-02-20 ENCOUNTER — Encounter: Payer: Self-pay | Admitting: Family Medicine

## 2017-02-20 ENCOUNTER — Ambulatory Visit: Payer: Self-pay | Admitting: Family Medicine

## 2017-03-01 DIAGNOSIS — J449 Chronic obstructive pulmonary disease, unspecified: Secondary | ICD-10-CM | POA: Diagnosis not present

## 2017-03-06 ENCOUNTER — Other Ambulatory Visit: Payer: Self-pay | Admitting: Family Medicine

## 2017-04-01 DIAGNOSIS — J449 Chronic obstructive pulmonary disease, unspecified: Secondary | ICD-10-CM | POA: Diagnosis not present

## 2017-04-11 ENCOUNTER — Emergency Department (HOSPITAL_COMMUNITY): Payer: Medicare Other

## 2017-04-11 ENCOUNTER — Encounter (HOSPITAL_COMMUNITY): Payer: Self-pay

## 2017-04-11 ENCOUNTER — Inpatient Hospital Stay (HOSPITAL_COMMUNITY)
Admission: EM | Admit: 2017-04-11 | Discharge: 2017-04-13 | DRG: 190 | Disposition: A | Discharge status: Home / Self Care | Payer: Medicare Other | Attending: Internal Medicine | Admitting: Internal Medicine

## 2017-04-11 DIAGNOSIS — E1169 Type 2 diabetes mellitus with other specified complication: Secondary | ICD-10-CM | POA: Diagnosis not present

## 2017-04-11 DIAGNOSIS — J440 Chronic obstructive pulmonary disease with acute lower respiratory infection: Secondary | ICD-10-CM | POA: Diagnosis not present

## 2017-04-11 DIAGNOSIS — E1122 Type 2 diabetes mellitus with diabetic chronic kidney disease: Secondary | ICD-10-CM | POA: Diagnosis not present

## 2017-04-11 DIAGNOSIS — F17210 Nicotine dependence, cigarettes, uncomplicated: Secondary | ICD-10-CM | POA: Diagnosis present

## 2017-04-11 DIAGNOSIS — R7989 Other specified abnormal findings of blood chemistry: Secondary | ICD-10-CM

## 2017-04-11 DIAGNOSIS — R05 Cough: Secondary | ICD-10-CM | POA: Diagnosis not present

## 2017-04-11 DIAGNOSIS — N183 Chronic kidney disease, stage 3 unspecified: Secondary | ICD-10-CM | POA: Diagnosis present

## 2017-04-11 DIAGNOSIS — J449 Chronic obstructive pulmonary disease, unspecified: Secondary | ICD-10-CM | POA: Diagnosis not present

## 2017-04-11 DIAGNOSIS — E785 Hyperlipidemia, unspecified: Secondary | ICD-10-CM | POA: Diagnosis not present

## 2017-04-11 DIAGNOSIS — T380X5A Adverse effect of glucocorticoids and synthetic analogues, initial encounter: Secondary | ICD-10-CM | POA: Diagnosis not present

## 2017-04-11 DIAGNOSIS — Z720 Tobacco use: Secondary | ICD-10-CM

## 2017-04-11 DIAGNOSIS — J40 Bronchitis, not specified as acute or chronic: Secondary | ICD-10-CM

## 2017-04-11 DIAGNOSIS — J441 Chronic obstructive pulmonary disease with (acute) exacerbation: Secondary | ICD-10-CM | POA: Diagnosis not present

## 2017-04-11 DIAGNOSIS — E876 Hypokalemia: Secondary | ICD-10-CM | POA: Diagnosis present

## 2017-04-11 DIAGNOSIS — F209 Schizophrenia, unspecified: Secondary | ICD-10-CM | POA: Diagnosis present

## 2017-04-11 DIAGNOSIS — Z7984 Long term (current) use of oral hypoglycemic drugs: Secondary | ICD-10-CM | POA: Diagnosis not present

## 2017-04-11 DIAGNOSIS — J9620 Acute and chronic respiratory failure, unspecified whether with hypoxia or hypercapnia: Secondary | ICD-10-CM | POA: Diagnosis not present

## 2017-04-11 DIAGNOSIS — N179 Acute kidney failure, unspecified: Secondary | ICD-10-CM | POA: Diagnosis not present

## 2017-04-11 DIAGNOSIS — M1990 Unspecified osteoarthritis, unspecified site: Secondary | ICD-10-CM | POA: Diagnosis present

## 2017-04-11 DIAGNOSIS — R0682 Tachypnea, not elsewhere classified: Secondary | ICD-10-CM | POA: Diagnosis not present

## 2017-04-11 DIAGNOSIS — E871 Hypo-osmolality and hyponatremia: Secondary | ICD-10-CM | POA: Diagnosis present

## 2017-04-11 DIAGNOSIS — R0602 Shortness of breath: Secondary | ICD-10-CM | POA: Diagnosis not present

## 2017-04-11 DIAGNOSIS — Z7951 Long term (current) use of inhaled steroids: Secondary | ICD-10-CM

## 2017-04-11 DIAGNOSIS — Z6830 Body mass index (BMI) 30.0-30.9, adult: Secondary | ICD-10-CM

## 2017-04-11 DIAGNOSIS — I10 Essential (primary) hypertension: Secondary | ICD-10-CM | POA: Diagnosis not present

## 2017-04-11 DIAGNOSIS — E669 Obesity, unspecified: Secondary | ICD-10-CM | POA: Diagnosis present

## 2017-04-11 DIAGNOSIS — Z888 Allergy status to other drugs, medicaments and biological substances status: Secondary | ICD-10-CM

## 2017-04-11 DIAGNOSIS — Z9981 Dependence on supplemental oxygen: Secondary | ICD-10-CM

## 2017-04-11 DIAGNOSIS — J209 Acute bronchitis, unspecified: Secondary | ICD-10-CM | POA: Diagnosis present

## 2017-04-11 DIAGNOSIS — R000 Tachycardia, unspecified: Secondary | ICD-10-CM | POA: Diagnosis not present

## 2017-04-11 DIAGNOSIS — I129 Hypertensive chronic kidney disease with stage 1 through stage 4 chronic kidney disease, or unspecified chronic kidney disease: Secondary | ICD-10-CM | POA: Diagnosis not present

## 2017-04-11 DIAGNOSIS — Z79899 Other long term (current) drug therapy: Secondary | ICD-10-CM

## 2017-04-11 DIAGNOSIS — R748 Abnormal levels of other serum enzymes: Secondary | ICD-10-CM

## 2017-04-11 DIAGNOSIS — N1832 Chronic kidney disease, stage 3b: Secondary | ICD-10-CM | POA: Diagnosis present

## 2017-04-11 DIAGNOSIS — D72829 Elevated white blood cell count, unspecified: Secondary | ICD-10-CM | POA: Diagnosis present

## 2017-04-11 DIAGNOSIS — R778 Other specified abnormalities of plasma proteins: Secondary | ICD-10-CM

## 2017-04-11 DIAGNOSIS — R062 Wheezing: Secondary | ICD-10-CM | POA: Diagnosis not present

## 2017-04-11 DIAGNOSIS — F329 Major depressive disorder, single episode, unspecified: Secondary | ICD-10-CM | POA: Diagnosis present

## 2017-04-11 DIAGNOSIS — Z7982 Long term (current) use of aspirin: Secondary | ICD-10-CM

## 2017-04-11 LAB — TROPONIN I
Troponin I: 0.04 ng/mL (ref <0.03)
Troponin I: 0.04 ng/mL (ref <0.03)
Troponin I: 0.04 ng/mL (ref <0.03)
Troponin I: 0.04 ng/mL (ref <0.03)

## 2017-04-11 LAB — CBC
HCT: 40.2 % (ref 39.0–52.0)
HCT: 41.9 % (ref 39.0–52.0)
Hemoglobin: 13.4 g/dL (ref 13.0–17.0)
Hemoglobin: 13.6 g/dL (ref 13.0–17.0)
MCH: 29.3 pg (ref 26.0–34.0)
MCH: 28.7 pg (ref 26.0–34.0)
MCHC: 33.3 g/dL (ref 30.0–36.0)
MCHC: 32.5 g/dL (ref 30.0–36.0)
MCV: 88 fL (ref 78.0–100.0)
MCV: 88.4 fL (ref 78.0–100.0)
Platelets: 206 10*3/uL (ref 150–400)
Platelets: 235 10*3/uL (ref 150–400)
RBC: 4.57 MIL/uL (ref 4.22–5.81)
RBC: 4.74 MIL/uL (ref 4.22–5.81)
RDW: 14.1 % (ref 11.5–15.5)
RDW: 14.1 % (ref 11.5–15.5)
WBC: 6.7 10*3/uL (ref 4.0–10.5)
WBC: 5.2 10*3/uL (ref 4.0–10.5)

## 2017-04-11 LAB — CREATININE, SERUM
Creatinine, Ser: 1.76 mg/dL — ABNORMAL HIGH (ref 0.61–1.24)
GFR calc Af Amer: 44 mL/min — ABNORMAL LOW (ref >60)
GFR calc non Af Amer: 38 mL/min — ABNORMAL LOW (ref >60)

## 2017-04-11 LAB — INFLUENZA PANEL BY PCR (TYPE A & B)
Influenza A By PCR: NEGATIVE
Influenza B By PCR: NEGATIVE

## 2017-04-11 LAB — BASIC METABOLIC PANEL
Anion gap: 11 (ref 5–15)
BUN: 10 mg/dL (ref 6–20)
CO2: 23 mmol/L (ref 22–32)
Calcium: 9.8 mg/dL (ref 8.9–10.3)
Chloride: 99 mmol/L — ABNORMAL LOW (ref 101–111)
Creatinine, Ser: 1.48 mg/dL — ABNORMAL HIGH (ref 0.61–1.24)
GFR calc Af Amer: 55 mL/min — ABNORMAL LOW (ref >60)
GFR calc non Af Amer: 47 mL/min — ABNORMAL LOW (ref >60)
Glucose, Bld: 195 mg/dL — ABNORMAL HIGH (ref 65–99)
Potassium: 3.4 mmol/L — ABNORMAL LOW (ref 3.5–5.1)
Sodium: 133 mmol/L — ABNORMAL LOW (ref 135–145)

## 2017-04-11 LAB — TSH: TSH: 0.868 u[IU]/mL (ref 0.350–4.500)

## 2017-04-11 LAB — GLUCOSE, CAPILLARY: Glucose-Capillary: 277 mg/dL — ABNORMAL HIGH (ref 65–99)

## 2017-04-11 MED ORDER — ONDANSETRON HCL 4 MG PO TABS: 4.0000 mg | ORAL_TABLET | Freq: Four times a day (QID) | ORAL | Status: DC | PRN

## 2017-04-11 MED ORDER — PANTOPRAZOLE SODIUM 40 MG PO TBEC
40.0000 mg | DELAYED_RELEASE_TABLET | Freq: Every day | ORAL | Status: DC
  Administered 2017-04-11 – 2017-04-13 (×3): 40 mg via ORAL
  Filled 2017-04-11 (×3): qty 1

## 2017-04-11 MED ORDER — BUSPIRONE HCL 5 MG PO TABS
7.5000 mg | ORAL_TABLET | Freq: Three times a day (TID) | ORAL | Status: DC
  Administered 2017-04-11 – 2017-04-13 (×6): 7.5 mg via ORAL
  Filled 2017-04-11 (×6): qty 2

## 2017-04-11 MED ORDER — MOMETASONE FURO-FORMOTEROL FUM 100-5 MCG/ACT IN AERO
2.0000 | INHALATION_SPRAY | Freq: Two times a day (BID) | RESPIRATORY_TRACT | Status: DC
  Administered 2017-04-11 – 2017-04-13 (×5): 2 via RESPIRATORY_TRACT
  Filled 2017-04-11: qty 8.8

## 2017-04-11 MED ORDER — BENZONATATE 100 MG PO CAPS
100.0000 mg | ORAL_CAPSULE | Freq: Three times a day (TID) | ORAL | Status: DC | PRN
  Administered 2017-04-11 – 2017-04-12 (×4): 100 mg via ORAL
  Filled 2017-04-11 (×4): qty 1

## 2017-04-11 MED ORDER — IPRATROPIUM BROMIDE 0.02 % IN SOLN
0.5000 mg | Freq: Four times a day (QID) | RESPIRATORY_TRACT | Status: DC
  Administered 2017-04-11 – 2017-04-13 (×8): 0.5 mg via RESPIRATORY_TRACT
  Filled 2017-04-11 (×9): qty 2.5

## 2017-04-11 MED ORDER — IPRATROPIUM-ALBUTEROL 0.5-2.5 (3) MG/3ML IN SOLN
3.0000 mL | Freq: Once | RESPIRATORY_TRACT | Status: AC
  Administered 2017-04-11: 3 mL via RESPIRATORY_TRACT
  Filled 2017-04-11: qty 3

## 2017-04-11 MED ORDER — LEVALBUTEROL HCL 0.63 MG/3ML IN NEBU
0.6300 mg | INHALATION_SOLUTION | Freq: Four times a day (QID) | RESPIRATORY_TRACT | Status: DC
  Administered 2017-04-12 – 2017-04-13 (×6): 0.63 mg via RESPIRATORY_TRACT
  Filled 2017-04-11 (×7): qty 3

## 2017-04-11 MED ORDER — SODIUM CHLORIDE 0.9 % IV SOLN
INTRAVENOUS | Status: DC
  Administered 2017-04-11 – 2017-04-12 (×3): via INTRAVENOUS

## 2017-04-11 MED ORDER — ONDANSETRON HCL 4 MG/2ML IJ SOLN: 4.0000 mg | Freq: Four times a day (QID) | INTRAMUSCULAR | Status: DC | PRN

## 2017-04-11 MED ORDER — ASPIRIN EC 81 MG PO TBEC
81.0000 mg | DELAYED_RELEASE_TABLET | Freq: Every day | ORAL | Status: DC
Start: 2017-04-11 — End: 2017-04-13
  Administered 2017-04-11 – 2017-04-13 (×3): 81 mg via ORAL
  Filled 2017-04-11 (×3): qty 1

## 2017-04-11 MED ORDER — AZITHROMYCIN 250 MG PO TABS
500.0000 mg | ORAL_TABLET | Freq: Once | ORAL | Status: AC
  Administered 2017-04-11: 500 mg via ORAL
  Filled 2017-04-11: qty 2

## 2017-04-11 MED ORDER — RISPERIDONE 1 MG PO TABS
4.0000 mg | ORAL_TABLET | Freq: Every day | ORAL | Status: DC
Start: 2017-04-11 — End: 2017-04-13
  Administered 2017-04-11 – 2017-04-12 (×2): 4 mg via ORAL
  Filled 2017-04-11 (×2): qty 4

## 2017-04-11 MED ORDER — ACETAMINOPHEN 325 MG PO TABS: 650.0000 mg | ORAL_TABLET | Freq: Four times a day (QID) | ORAL | Status: DC | PRN

## 2017-04-11 MED ORDER — ENOXAPARIN SODIUM 40 MG/0.4ML ~~LOC~~ SOLN
40.0000 mg | SUBCUTANEOUS | Status: DC
  Administered 2017-04-11 – 2017-04-13 (×3): 40 mg via SUBCUTANEOUS
  Filled 2017-04-11 (×3): qty 0.4

## 2017-04-11 MED ORDER — SENNOSIDES-DOCUSATE SODIUM 8.6-50 MG PO TABS: 1.0000 | ORAL_TABLET | Freq: Every evening | ORAL | Status: DC | PRN

## 2017-04-11 MED ORDER — NICOTINE 21 MG/24HR TD PT24
21.0000 mg | MEDICATED_PATCH | Freq: Every day | TRANSDERMAL | Status: DC
  Administered 2017-04-11 – 2017-04-13 (×3): 21 mg via TRANSDERMAL
  Filled 2017-04-11 (×3): qty 1

## 2017-04-11 MED ORDER — ORAL CARE MOUTH RINSE
15.0000 mL | Freq: Two times a day (BID) | OROMUCOSAL | Status: DC
  Administered 2017-04-12 – 2017-04-13 (×2): 15 mL via OROMUCOSAL

## 2017-04-11 MED ORDER — METHYLPREDNISOLONE SODIUM SUCC 125 MG IJ SOLR
60.0000 mg | Freq: Four times a day (QID) | INTRAMUSCULAR | Status: DC
  Filled 2017-04-11: qty 2

## 2017-04-11 MED ORDER — INSULIN ASPART 100 UNIT/ML ~~LOC~~ SOLN
0.0000 [IU] | Freq: Three times a day (TID) | SUBCUTANEOUS | Status: DC
  Administered 2017-04-12: 5 [IU] via SUBCUTANEOUS
  Administered 2017-04-12: 3 [IU] via SUBCUTANEOUS

## 2017-04-11 MED ORDER — SODIUM CHLORIDE 3 % IN NEBU
4.0000 mL | INHALATION_SOLUTION | Freq: Every day | RESPIRATORY_TRACT | Status: DC
  Administered 2017-04-11: 4 mL via RESPIRATORY_TRACT

## 2017-04-11 MED ORDER — PRAVASTATIN SODIUM 10 MG PO TABS
20.0000 mg | ORAL_TABLET | Freq: Every day | ORAL | Status: DC
  Administered 2017-04-11 – 2017-04-12 (×2): 20 mg via ORAL
  Filled 2017-04-11 (×2): qty 2

## 2017-04-11 MED ORDER — RISPERIDONE 1 MG PO TABS: 1.0000 mg | ORAL_TABLET | Freq: Every day | ORAL | Status: DC

## 2017-04-11 MED ORDER — BISACODYL 10 MG RE SUPP: 10.0000 mg | Freq: Every day | RECTAL | Status: DC | PRN

## 2017-04-11 MED ORDER — LEVALBUTEROL HCL 0.63 MG/3ML IN NEBU
0.6300 mg | INHALATION_SOLUTION | Freq: Four times a day (QID) | RESPIRATORY_TRACT | Status: DC
  Administered 2017-04-11 (×2): 0.63 mg via RESPIRATORY_TRACT
  Filled 2017-04-11 (×2): qty 3

## 2017-04-11 MED ORDER — METHYLPREDNISOLONE SODIUM SUCC 125 MG IJ SOLR
125.0000 mg | Freq: Once | INTRAMUSCULAR | Status: AC
  Administered 2017-04-11: 125 mg via INTRAVENOUS
  Filled 2017-04-11: qty 2

## 2017-04-11 MED ORDER — ACETAMINOPHEN 650 MG RE SUPP: 650.0000 mg | Freq: Four times a day (QID) | RECTAL | Status: DC | PRN

## 2017-04-11 MED ORDER — LEVALBUTEROL HCL 1.25 MG/0.5ML IN NEBU
1.2500 mg | INHALATION_SOLUTION | RESPIRATORY_TRACT | Status: DC | PRN
  Administered 2017-04-11 – 2017-04-13 (×2): 1.25 mg via RESPIRATORY_TRACT
  Filled 2017-04-11 (×2): qty 0.5

## 2017-04-11 MED ORDER — DM-GUAIFENESIN ER 30-600 MG PO TB12
1.0000 | ORAL_TABLET | Freq: Two times a day (BID) | ORAL | Status: DC
  Administered 2017-04-11 – 2017-04-13 (×4): 1 via ORAL
  Filled 2017-04-11 (×4): qty 1

## 2017-04-11 MED ORDER — METHYLPREDNISOLONE SODIUM SUCC 125 MG IJ SOLR
60.0000 mg | Freq: Four times a day (QID) | INTRAMUSCULAR | Status: AC
  Administered 2017-04-12 (×4): 60 mg via INTRAVENOUS
  Filled 2017-04-11 (×3): qty 2

## 2017-04-11 MED ORDER — POTASSIUM CHLORIDE CRYS ER 20 MEQ PO TBCR
40.0000 meq | EXTENDED_RELEASE_TABLET | Freq: Two times a day (BID) | ORAL | Status: AC
  Administered 2017-04-11 (×2): 40 meq via ORAL
  Filled 2017-04-11 (×2): qty 2

## 2017-04-11 MED ORDER — PREDNISONE 20 MG PO TABS: 40.0000 mg | ORAL_TABLET | Freq: Every day | ORAL | Status: DC

## 2017-04-11 MED ORDER — AZITHROMYCIN 250 MG PO TABS
250.0000 mg | ORAL_TABLET | Freq: Every day | ORAL | Status: DC
  Administered 2017-04-12 – 2017-04-13 (×2): 250 mg via ORAL
  Filled 2017-04-11 (×2): qty 1

## 2017-04-11 NOTE — ED Provider Notes (Signed)
[facility] DEPT Provider Note   CSN: [id number] Arrival date & time: 04/11/17  0645     History   Chief Complaint Chief Complaint  Patient presents with  . Shortness of Breath    HPI Matthew Molina is a 66 y.o. male.   On 3 L of oxygen at all times. Patient completed a steroid course about a week ago. Patient with increased shortness of breath over the past several days but much worse in the past 2 days. Persistent coughing occasionally productive. No chest pain no fevers. Patient brought in by EMS. EMS gave him a breathing treatment in route.      Past Medical History:  Diagnosis Date  . Acute kidney injury ([place])   . Acute respiratory failure with hypoxia ([place])   . Arthritis   . COPD (chronic obstructive pulmonary disease) ([place])   . Depression   . Diabetes mellitus   . Diabetes mellitus without complication ([place])   . Hypercholesterolemia   . Hyperlipidemia   . Hypertension   . Multiple lung nodules on CT 04/02/2015  . Nicotine addiction   . Obesity   . Oxygen deficiency    qhs  . Schizophrenia [facility])     Patient Active Problem List   Diagnosis Date Noted  . COPD exacerbation ([place]) 02/05/2017  . Hypokalemia 02/05/2017  . ARF (acute renal failure) ([place]) 02/05/2017  . Elevated PSA 10/01/2016  . External hemorrhoid, thrombosed 08/24/2016  . GERD (gastroesophageal reflux disease) 08/24/2016  . CKD (chronic kidney disease) stage 3, GFR 30-59 ml/min 05/22/2016  . Dysphagia 01/05/2016  . Insomnia 01/05/2016  . COPD (chronic obstructive pulmonary disease) ([place]) 11/05/2015  . Hypertension 11/05/2015  . Tobacco abuse 07/24/2015  . Coronary atherosclerosis of native coronary artery 04/02/2015  . Multiple lung nodules on CT 04/02/2015  . GAD (generalized anxiety disorder) 07/04/2012  . Back pain with radiation 11/01/2011  . Diabetes mellitus type 2 in obese ([place]) 04/17/2011  . Hyperlipidemia 06/02/2008  . Obesity (BMI 30.0-34.9) 06/02/2008  . Schizophrenia,  unspecified type ([place]) 06/02/2008    Past Surgical History:  Procedure Laterality Date  . CATARACT EXTRACTION W/PHACO Left 11/20/2013   Procedure: CATARACT EXTRACTION PHACO AND INTRAOCULAR LENS PLACEMENT (IOC);  Surgeon: Tonny Branch, MD;  Location: [facility] ORS;  Service: Ophthalmology;  Laterality: Left;  CDE:10.26  . CATARACT EXTRACTION W/PHACO Right 12/08/2013   Procedure: RIGHT EYE CATARACT EXTRACTION PHACO AND INTRAOCULAR LENS PLACEMENT ;  Surgeon: Tonny Branch, MD;  Location: [facility] ORS;  Service: Ophthalmology;  Laterality: Right;  CDE 12.38  . COLONOSCOPY N/A 04/01/2013   Procedure: COLONOSCOPY;  Surgeon: Danie Binder, MD;  Location: [facility] ENDO SUITE;  Service: Endoscopy;  Laterality: N/A;  10:00 AM-moved to [place] notified pt  . COLONOSCOPY  2014   INCOMPLETE PREP IN R COLON  . ESOPHAGOGASTRODUODENOSCOPY N/A 02/18/2016   Procedure: ESOPHAGOGASTRODUODENOSCOPY (EGD);  Surgeon: Danie Binder, MD;  Location: [facility] ENDO SUITE;  Service: Endoscopy;  Laterality: N/A;  1145  . EYE SURGERY Left 11/2013   cataract extraction  . SAVORY DILATION N/A 02/18/2016   Procedure: SAVORY DILATION;  Surgeon: Danie Binder, MD;  Location: [facility] ENDO SUITE;  Service: Endoscopy;  Laterality: N/A;  . SHOULDER SURGERY         Home Medications    Prior to Admission medications   Medication Sig Start Date End Date Taking? Authorizing Provider  acetaminophen (TYLENOL) 500 MG tablet Take 500 mg by mouth daily as needed for moderate pain.  Yes Historical Provider, MD  albuterol (PROVENTIL HFA;VENTOLIN HFA) 108 (90 BASE) MCG/ACT inhaler Inhale 2 puffs into the lungs every 6 (six) hours as needed for wheezing or shortness of breath. 07/29/15  Yes Ezequiel Essex, MD  aspirin EC 81 MG tablet Take 81 mg by mouth daily.   Yes Historical Provider, MD  busPIRone (BUSPAR) 7.5 MG tablet Take 1 tablet (7.5 mg total) by mouth 3 (three) times daily. 12/28/16  Yes Fayrene Helper, MD  GLIPIZIDE XL 5 MG 24 hr tablet TAKE 1 TABLET  DAILY WITH BREAKFAST. 03/06/17  Yes Fayrene Helper, MD  ipratropium-albuterol (DUONEB) 0.5-2.5 (3) MG/3ML SOLN INHALE 1 VIAL VIA NEBULIZER EVERY FOUR HOURS AS NEEDED. 03/02/16  Yes Fayrene Helper, MD  omeprazole (PRILOSEC) 20 MG capsule TAKE 1 CAPSULE BY MOUTH 30 MINUTES PRIOR TO BREAKFAST AND SUPPER. 02/19/17  Yes Carlis Stable, NP  polyethylene glycol powder (GLYCOLAX/MIRALAX) powder MIX 1 CAPFUL (17G) IN 8 OUNCES OF JUICE/WATER AND DRINK ONCE DAILY. 12/23/15  Yes Fayrene Helper, MD  pravastatin (PRAVACHOL) 20 MG tablet TAKE (1) TABLET BY MOUTH AT BEDTIME FOR CHOLESTEROL. 02/19/17  Yes Fayrene Helper, MD  risperiDONE (RISPERDAL) 1 MG tablet TAKE (1) TABLET BY MOUTH AT BEDTIME WITH 4MG . 01/22/17  Yes Fayrene Helper, MD  risperidone (RISPERDAL) 4 MG tablet TAKE (1) TABLET BY MOUTH AT BEDTIME. 12/22/16  Yes Fayrene Helper, MD  SYMBICORT 80-4.5 MCG/ACT inhaler INHALE 2 PUFFS INTO LUNGS TWICE DAILY. 11/20/16  Yes Fayrene Helper, MD  traZODone (DESYREL) 50 MG tablet TAKE ONE TABLET BY MOUTH AT BEDTIME AS NEEDED FOR SLEEP. 01/26/17  Yes Fayrene Helper, MD  triamterene-hydrochlorothiazide (MAXZIDE-25) 37.5-25 MG tablet Half tablet once daily Patient taking differently: Take 0.5 tablets by mouth daily.  12/28/16  Yes Fayrene Helper, MD  valsartan (DIOVAN) 320 MG tablet TAKE ONE TABLET BY MOUTH DAILY. 01/22/17  Yes Fayrene Helper, MD  ACCU-CHEK SOFTCLIX LANCETS lancets TEST BLOOD SUGAR ONCE DAILY AS DIRECTED. 07/26/16   Fayrene Helper, MD  azithromycin (ZITHROMAX) 500 MG tablet Take 1 tablet (500 mg total) by mouth daily. Take in the evening. Deliver to patient's house. Patient not taking: Reported on 04/11/2017 02/06/17   Samuella Cota, MD  glucose blood (ACCU-CHEK AVIVA PLUS) test strip Once daily testing E11.9, E66.9 09/12/16   Fayrene Helper, MD  predniSONE (DELTASONE) 10 MG tablet Take daily by mouth: 40 mg x3 days, then 20 mg x3 days, then 10 mg x3 days, then stop. Deliver to  house. Patient not taking: Reported on 04/11/2017 02/06/17   Samuella Cota, MD  SPIRIVA HANDIHALER 18 MCG inhalation capsule INHALE 1 CAPSULE AS DIRECTED ONCE A DAY. Patient not taking: Reported on 04/11/2017 02/22/15   Fayrene Helper, MD    Family History Family History  Problem Relation Age of Onset  . Diabetes Mother   . Hypertension Mother   . Stroke Mother   . Diabetes Sister   . Heart disease Sister   . Kidney disease Father   . Colon cancer Neg Hx   . Colon polyps Neg Hx     Social History Social History  Substance Use Topics  . Smoking status: Current Every Day Smoker    Packs/day: 1.50    Types: Cigarettes  . Smokeless tobacco: Never Used  . Alcohol use No     Allergies   Sertraline hcl and Wellbutrin [bupropion]   Review of Systems Review of Systems  Constitutional: Negative for  fever.  HENT: Negative for congestion.   Eyes: Negative for redness and visual disturbance.  Respiratory: Positive for cough, shortness of breath and wheezing.   Cardiovascular: Negative for chest pain.  Gastrointestinal: Negative for abdominal pain.  Genitourinary: Negative for dysuria.  Musculoskeletal: Negative for myalgias.  Skin: Negative for rash.  Neurological: Negative for headaches.  Hematological: Does not bruise/bleed easily.  Psychiatric/Behavioral: Negative for confusion.     Physical Exam Updated Vital Signs BP 132/80   Pulse (!) 119   Resp (!) 30   Ht 6\' 1"  (1.854 m)   Wt 102.5 kg   SpO2 93%   BMI 29.82 kg/m   Physical Exam  Constitutional: He is oriented to person, place, and time. He appears well-developed and well-nourished. He appears distressed.  HENT:  Head: Normocephalic and atraumatic.  Mouth/Throat: Oropharynx is clear and moist.  Eyes: EOM are normal. Pupils are equal, round, and reactive to light.  Neck: Normal range of motion. Neck supple.  Cardiovascular: Regular rhythm and normal heart sounds.   Slightly tachycardic    Pulmonary/Chest: Effort normal. He has wheezes.  Abdominal: Soft. Bowel sounds are normal. There is no tenderness.  Musculoskeletal: Normal range of motion. He exhibits no edema.  Neurological: He is alert and oriented to person, place, and time. No cranial nerve deficit or sensory deficit. He exhibits normal muscle tone. Coordination normal.  Skin: Skin is warm.  Nursing note and vitals reviewed.    ED Treatments / Results  Labs (all labs ordered are listed, but only abnormal results are displayed) Labs Reviewed  BASIC METABOLIC PANEL - Abnormal; Notable for the following:       Result Value   Sodium 133 (*)    Potassium 3.4 (*)    Chloride 99 (*)    Glucose, Bld 195 (*)    Creatinine, Ser 1.48 (*)    GFR calc non Af Amer 47 (*)    GFR calc Af Amer 55 (*)    All other components within normal limits  TROPONIN I - Abnormal; Notable for the following:    Troponin I 0.04 (*)    All other components within normal limits  TROPONIN I - Abnormal; Notable for the following:    Troponin I 0.04 (*)    All other components within normal limits  CBC    EKG  EKG Interpretation  Date/Time:  Wednesday April 11 2017 06:51:37 EDT Ventricular Rate:  115 PR Interval:    QRS Duration: 78 QT Interval:  326 QTC Calculation: 451 R Axis:   -75 Text Interpretation:  Sinus tachycardia Right atrial enlargement Inferior infarct, old Baseline wander in lead(s) V1 No significant change was found Confirmed by CAMPOS  MD, Lennette Bihari ([id number]) on 04/11/2017 6:56:18 AM       Radiology Dg Chest Portable 1 View  Result Date: 04/11/2017 CLINICAL DATA:  66 year old with acute onset of shortness of breath, cough and wheezing which began this morning. EXAM: PORTABLE CHEST 1 VIEW COMPARISON:  02/05/2017, 01/06/2017 and earlier, including screening CT chest 01/11/2017. FINDINGS: Suboptimal inspiration accounts for crowded bronchovascular markings, especially in the bases, and accentuates the cardiac silhouette.  Taking this into account, right silhouette normal in size. Thoracic aorta mildly tortuous atherosclerotic, unchanged. Hilar and mediastinal contours otherwise unremarkable. Lungs clear. Bronchovascular markings normal. Pulmonary vascularity normal. No visible pleural effusions. No pneumothorax. IMPRESSION: Suboptimal inspiration.  No acute cardiopulmonary disease. Electronically Signed   By: Evangeline Dakin M.D.   On: 04/11/2017 07:17    Procedures Procedures (  including critical care time)  Medications Ordered in ED Medications  ipratropium-albuterol (DUONEB) 0.5-2.5 (3) MG/3ML nebulizer solution 3 mL (not administered)  methylPREDNISolone sodium succinate (SOLU-MEDROL) 125 mg/2 mL injection 125 mg (125 mg Intravenous Given 04/11/17 0703)  ipratropium-albuterol (DUONEB) 0.5-2.5 (3) MG/3ML nebulizer solution 3 mL (3 mLs Nebulization Given 04/11/17 0752)     Initial Impression / Assessment and Plan / ED Course  I have reviewed the triage vital signs and the nursing notes.  Pertinent labs & imaging results that were available during my care of the patient were reviewed by me and considered in my medical decision making (see chart for details).      patient with known history of COPD. Had exacerbation of that for several days but much worse in the past 2 days. Work coughing occasionally productive. Persistent wheezing here but improved with albuterol Atrovent nebulizer treatments. Patient also received site Medrol 125 mg. Chest x-ray negative for pneumonia or pulmonary edema or pneumothorax. Patient will require admission for the persistent wheezing.   Patient had an initial troponin of 0.04. 3 hour repeat was the same. This is not consistent with an acute cardiac event. Patient historically has no chest pain. Nothing concerning for coronary artery disease.  Final Clinical Impressions(s) / ED Diagnoses   Final diagnoses:  COPD exacerbation ([place])  Bronchitis    New Prescriptions New  Prescriptions   No medications on file     Fredia Sorrow, MD 04/11/17 1121

## 2017-04-11 NOTE — ED Notes (Signed)
Report given to Jessica, RN on 300 

## 2017-04-11 NOTE — H&P (Signed)
History and Physical    Matthew Molina QMG:[id number] DOB: 1951/02/25 DOA: 04/11/2017  PCP: [name], MD   Patient coming from: Home  Chief Complaint: Breathing Problems and Coughing  HPI: Matthew Molina is a 66 y.o. male with medical history significant of HTN, HLD, Depression, Schizophrenia, COPD Diabetes Mellitus and other comorbid conditions who presented to [facility] with worsening SOB and coughing. Patient states that his SOB started a couple days ago and progressively got worse today. Patient states he has been coughing up intermittent white sputum but none today. Last Hospitalization in Feburary with similar complaints.No Nausea or Vomiting but has CP from coughing. No lightheadedness or dizziness.  At baseline wears 3 liters prn and qHS. States he has had persistent coughing and has had similar complaints back in February.   ED Course: Was given DuoNeb Treatments, IV Solumedrol, had Blood Work and CXR.   Review of Systems: As per HPI otherwise 10 point review of systems negative. Denied any Urinary or Bowel problems.   Past Medical History:  Diagnosis Date  . Acute kidney injury ([place])   . Acute respiratory failure with hypoxia ([place])   . Arthritis   . COPD (chronic obstructive pulmonary disease) ([place])   . Depression   . Diabetes mellitus   . Diabetes mellitus without complication ([place])   . Hypercholesterolemia   . Hyperlipidemia   . Hypertension   . Multiple lung nodules on CT 04/02/2015  . Nicotine addiction   . Obesity   . Oxygen deficiency    qhs  . Schizophrenia [facility])    Past Surgical History:  Procedure Laterality Date  . CATARACT EXTRACTION W/PHACO Left 11/20/2013   Procedure: CATARACT EXTRACTION PHACO AND INTRAOCULAR LENS PLACEMENT (IOC);  Surgeon: Tonny Branch, MD;  Location: [facility] ORS;  Service: Ophthalmology;  Laterality: Left;  CDE:10.26  . CATARACT EXTRACTION W/PHACO Right 12/08/2013   Procedure: RIGHT EYE CATARACT EXTRACTION PHACO AND  INTRAOCULAR LENS PLACEMENT ;  Surgeon: Tonny Branch, MD;  Location: [facility] ORS;  Service: Ophthalmology;  Laterality: Right;  CDE 12.38  . COLONOSCOPY N/A 04/01/2013   Procedure: COLONOSCOPY;  Surgeon: Danie Binder, MD;  Location: [facility] ENDO SUITE;  Service: Endoscopy;  Laterality: N/A;  10:00 AM-moved to [place] notified pt  . COLONOSCOPY  2014   INCOMPLETE PREP IN R COLON  . ESOPHAGOGASTRODUODENOSCOPY N/A 02/18/2016   Procedure: ESOPHAGOGASTRODUODENOSCOPY (EGD);  Surgeon: Danie Binder, MD;  Location: [facility] ENDO SUITE;  Service: Endoscopy;  Laterality: N/A;  1145  . EYE SURGERY Left 11/2013   cataract extraction  . SAVORY DILATION N/A 02/18/2016   Procedure: SAVORY DILATION;  Surgeon: Danie Binder, MD;  Location: [facility] ENDO SUITE;  Service: Endoscopy;  Laterality: N/A;  . SHOULDER SURGERY     SOCIAL HISTORY  reports that he has been smoking Cigarettes.  He has been smoking about 1.50 packs per day. He has never used smokeless tobacco. He reports that he does not drink alcohol or use drugs.  Allergies  Allergen Reactions  . Sertraline Hcl     Stomach upset/pain  . Wellbutrin [Bupropion] Other (See Comments)    Makes stomach hurt    Family History  Problem Relation Age of Onset  . Diabetes Mother   . Hypertension Mother   . Stroke Mother   . Diabetes Sister   . Heart disease Sister   . Kidney disease Father   . Colon cancer Neg Hx   . Colon polyps Neg Hx  Prior to Admission medications   Medication Sig Start Date End Date Taking? Authorizing Provider  acetaminophen (TYLENOL) 500 MG tablet Take 500 mg by mouth daily as needed for moderate pain.    Yes Historical Provider, MD  albuterol (PROVENTIL HFA;VENTOLIN HFA) 108 (90 BASE) MCG/ACT inhaler Inhale 2 puffs into the lungs every 6 (six) hours as needed for wheezing or shortness of breath. 07/29/15  Yes Ezequiel Essex, MD  aspirin EC 81 MG tablet Take 81 mg by mouth daily.   Yes Historical Provider, MD  busPIRone (BUSPAR) 7.5 MG tablet  Take 1 tablet (7.5 mg total) by mouth 3 (three) times daily. 12/28/16  Yes Fayrene Helper, MD  GLIPIZIDE XL 5 MG 24 hr tablet TAKE 1 TABLET DAILY WITH BREAKFAST. 03/06/17  Yes Fayrene Helper, MD  ipratropium-albuterol (DUONEB) 0.5-2.5 (3) MG/3ML SOLN INHALE 1 VIAL VIA NEBULIZER EVERY FOUR HOURS AS NEEDED. 03/02/16  Yes Fayrene Helper, MD  omeprazole (PRILOSEC) 20 MG capsule TAKE 1 CAPSULE BY MOUTH 30 MINUTES PRIOR TO BREAKFAST AND SUPPER. 02/19/17  Yes Carlis Stable, NP  polyethylene glycol powder (GLYCOLAX/MIRALAX) powder MIX 1 CAPFUL (17G) IN 8 OUNCES OF JUICE/WATER AND DRINK ONCE DAILY. 12/23/15  Yes Fayrene Helper, MD  pravastatin (PRAVACHOL) 20 MG tablet TAKE (1) TABLET BY MOUTH AT BEDTIME FOR CHOLESTEROL. 02/19/17  Yes Fayrene Helper, MD  risperiDONE (RISPERDAL) 1 MG tablet TAKE (1) TABLET BY MOUTH AT BEDTIME WITH 4MG . 01/22/17  Yes Fayrene Helper, MD  risperidone (RISPERDAL) 4 MG tablet TAKE (1) TABLET BY MOUTH AT BEDTIME. 12/22/16  Yes Fayrene Helper, MD  SYMBICORT 80-4.5 MCG/ACT inhaler INHALE 2 PUFFS INTO LUNGS TWICE DAILY. 11/20/16  Yes Fayrene Helper, MD  traZODone (DESYREL) 50 MG tablet TAKE ONE TABLET BY MOUTH AT BEDTIME AS NEEDED FOR SLEEP. 01/26/17  Yes Fayrene Helper, MD  triamterene-hydrochlorothiazide (MAXZIDE-25) 37.5-25 MG tablet Half tablet once daily Patient taking differently: Take 0.5 tablets by mouth daily.  12/28/16  Yes Fayrene Helper, MD  valsartan (DIOVAN) 320 MG tablet TAKE ONE TABLET BY MOUTH DAILY. 01/22/17  Yes Fayrene Helper, MD  ACCU-CHEK SOFTCLIX LANCETS lancets TEST BLOOD SUGAR ONCE DAILY AS DIRECTED. 07/26/16   Fayrene Helper, MD  azithromycin (ZITHROMAX) 500 MG tablet Take 1 tablet (500 mg total) by mouth daily. Take in the evening. Deliver to patient's house. Patient not taking: Reported on 04/11/2017 02/06/17   Samuella Cota, MD  glucose blood (ACCU-CHEK AVIVA PLUS) test strip Once daily testing E11.9, E66.9 09/12/16   Fayrene Helper, MD  predniSONE (DELTASONE) 10 MG tablet Take daily by mouth: 40 mg x3 days, then 20 mg x3 days, then 10 mg x3 days, then stop. Deliver to house. Patient not taking: Reported on 04/11/2017 02/06/17   Samuella Cota, MD  SPIRIVA HANDIHALER 18 MCG inhalation capsule INHALE 1 CAPSULE AS DIRECTED ONCE A DAY. Patient not taking: Reported on 04/11/2017 02/22/15   Fayrene Helper, MD   Physical Exam: Vitals:   04/11/17 1200 04/11/17 1334 04/11/17 1409 04/11/17 1658  BP: 116/88 131/69    Pulse: (!) 57 (!) 58 (!) 107   Resp: (!) 24 (!) 23    Temp:  97.7 F (36.5 C)    TempSrc:  Oral    SpO2: 94% 97% 95% 97%  Weight:  101.8 kg (224 lb 8 oz)    Height:       Constitutional: WN/WD, NAD and appears calm and comfortable Eyes: Lids  and conjunctivae normal, sclerae anicteric  ENMT: External Ears, Nose appear normal. Grossly normal hearing. Mucous membranes are moist.  Neck: Appears normal, supple, no cervical masses, normal ROM, no appreciable thyromegaly, no JVD Respiratory: Diminished to auscultation bilaterally with some expiratory wheezing. No rhonchi or crackles. Increased  respiratory effort and patient is tachypenic. No accessory muscle use. Patient wearing Supplemental O2 via [place].  Cardiovascular: Tachycardic Rate and Rhythm slightly irregular, no murmurs / rubs / gallops. S1 and S2 auscultated. No extremity edema.  Abdomen: Soft, non-tender, non-distended. No masses palpated. No appreciable hepatosplenomegaly. Bowel sounds positive x4.  GU: Deferred. Musculoskeletal: No clubbing / cyanosis of digits/nails. No joint deformity upper and lower extremities.  Skin: No rashes, lesions, ulcers on limited skin evaluation. No induration; Warm and dry.  Neurologic: CN 2-12 grossly intact with no focal deficits. Romberg sign cerebellar reflexes not assessed.  Psychiatric: Normal judgment and insight. Alert and oriented x 3. Normal mood and appropriate affect.   Labs on Admission: I have  personally reviewed following labs and imaging studies  CBC:  Recent Labs Lab 04/11/17 0706 04/11/17 1316  WBC 6.7 5.2  HGB 13.4 13.6  HCT 40.2 41.9  MCV 88.0 88.4  PLT 206 413   Basic Metabolic Panel:  Recent Labs Lab 04/11/17 0706 04/11/17 1316  NA 133*  --   K 3.4*  --   CL 99*  --   CO2 23  --   GLUCOSE 195*  --   BUN 10  --   CREATININE 1.48* 1.76*  CALCIUM 9.8  --    GFR: Estimated Creatinine Clearance: 51.1 mL/min (A) (by C-G formula based on SCr of 1.76 mg/dL (H)). Liver Function Tests: No results for input(s): AST, ALT, ALKPHOS, BILITOT, PROT, ALBUMIN in the last 168 hours. No results for input(s): LIPASE, AMYLASE in the last 168 hours. No results for input(s): AMMONIA in the last 168 hours. Coagulation Profile: No results for input(s): INR, PROTIME in the last 168 hours. Cardiac Enzymes:  Recent Labs Lab 04/11/17 0706 04/11/17 1006 04/11/17 1316  TROPONINI 0.04* 0.04* 0.04*   BNP (last 3 results) No results for input(s): PROBNP in the last 8760 hours. HbA1C: No results for input(s): HGBA1C in the last 72 hours. CBG: No results for input(s): GLUCAP in the last 168 hours. Lipid Profile: No results for input(s): CHOL, HDL, LDLCALC, TRIG, CHOLHDL, LDLDIRECT in the last 72 hours. Thyroid Function Tests:  Recent Labs  04/11/17 1316  TSH 0.868   Anemia Panel: No results for input(s): VITAMINB12, FOLATE, FERRITIN, TIBC, IRON, RETICCTPCT in the last 72 hours. Urine analysis:    Component Value Date/Time   COLORURINE lt. yellow 02/25/2009 1257   APPEARANCEUR Clear 02/25/2009 1257   LABSPEC 1.010 02/25/2009 1257   PHURINE 6.0 02/25/2009 1257   HGBUR negative 02/25/2009 1257   BILIRUBINUR negative 02/25/2009 1257   UROBILINOGEN 0.2 02/25/2009 1257   NITRITE negative 02/25/2009 1257   Sepsis Labs: !!!!!!!!!!!!!!!!!!!!!!!!!!!!!!!!!!!!!!!!!!!! @LABRCNTIP (procalcitonin:4,lacticidven:4) )No results found for this or any previous visit (from the  past 240 hour(s)).   Radiological Exams on Admission: Dg Chest Portable 1 View  Result Date: 04/11/2017 CLINICAL DATA:  66 year old with acute onset of shortness of breath, cough and wheezing which began this morning. EXAM: PORTABLE CHEST 1 VIEW COMPARISON:  02/05/2017, 01/06/2017 and earlier, including screening CT chest 01/11/2017. FINDINGS: Suboptimal inspiration accounts for crowded bronchovascular markings, especially in the bases, and accentuates the cardiac silhouette. Taking this into account, right silhouette normal in size. Thoracic aorta mildly tortuous  atherosclerotic, unchanged. Hilar and mediastinal contours otherwise unremarkable. Lungs clear. Bronchovascular markings normal. Pulmonary vascularity normal. No visible pleural effusions. No pneumothorax. IMPRESSION: Suboptimal inspiration.  No acute cardiopulmonary disease. Electronically Signed   By: Evangeline Dakin M.D.   On: 04/11/2017 07:17   EKG: Independently reviewed. Showed Sinus Tachycardia and wandering rhythm. No evidence of ST Elevation on my interpretation.  Assessment/Plan Active Problems:   Acute exacerbation of chronic obstructive pulmonary disease (COPD) (HCC)  Acute Exacerbation of COPD/Acute Bronchitis -Admit to Inpatient Telemetry -COPD GOLD Orders -C/w Supplemental O2; Neb Treatments with Xopenex 4 Times daily, Atrovent 4 times daily, Dulera 2 puff RT BID, IV Methylprednisolone 60 q6h and then transition to Prednisone 40 mg po daily with Supper; -C/w Flutter Valve, and Incentive Spirometry -C/w Azithromycin 500 mg po then 250 mg po Daily -Patient is Afebrile and no WBC -Counseled on Tobacco Smoking Cessation and provided Nicotine Patch 21 mg TD -Check Respiratory Virus Panel pending and Influenza via PCR Negative; Droplet Precautions -Repeat CXR in AM after IVF Rehydration  Acute on Chronic Respiratory Failure -Likely from Above; Patient wears 3 Liters of O2 via [place] at night and prn -C/w Supplemental O2 as  necessary; Wean as Tolerated -C/w Continuous Pulse Oximetry and Maintain O2 Saturations >92% -Patient was saturating 91% on Room Air but placed on O2 for Comfort -If Worsens will consider CTA of Chest to r/o PE  Mildly Elevated Troponin -Troponins were 0.04 x3 -Likely from CKD; Patient does not describe any CP except when coughing  -Continue to Trend  Tobacco Abuse/Nicotine Addiction -Counseled on Tobacco Smoking Cessation and provided Nicotine Patch 21 mg TD  Hypokalemia -Mild at 3.4 -Replete with 40 mEQ of KCl BID -Repeat CMP in AM  Hyponatremia -Patient's Na+ was 133 -C/w IVF NS at 75 mL/hr -Repeat CMP in AM  Hypertension -Hold Nephrotoxic Antihypertensives -Currently Well controlled without medication -If BP elevates will consider IV Hydralazine when necessary  Hyperlipidemia/Hypercholestrolemia -C/w Pravastatin 20 mg po    Diabetes Mellitus Type 2 -Hold Home Medication of Glipizide 5 mg XL -Place on Sensitive Novolog SSI -Check HbA1c -Hypoglycemic Protocol in Place  Schizophrenia -C/w Buspar 7.5 mg po TID and with Risperdal 4 mg po Daily  AKI on Chronic Kidney Disease Stage 3 -Hold Triamterene-HCTZ and Valsartan and Avoid Nephrotoxics -Review of prior Cr shows baseline around 1.2-1.4 -Creatine worsened from 1.48 -> 1.76 -C/w IVF Rehydration NS at 75 mL/hr -Repeat CMP in AM   DVT prophylaxis: Enoxaparin 40 mg sq q24h Code Status: FULL CODE Family Communication: No Family present at bedside Disposition Plan:  Home when medically Stable Consults called: None Admission status: Inpatient Telemetry  [place], D.O. [facility] Pager [phone]  If 7PM-7AM, please contact night-coverage [email or web address] Password [facility]  04/11/2017, 5:27 PM

## 2017-04-11 NOTE — ED Notes (Signed)
CRITICAL VALUE ALERT  Critical value received:  Troponin 0.04  Date of notification:  04/11/2017  Time of notification:  0758  Critical value read back:  YES  Nurse who received alert:  Cena Benton   MD notified (1st page):  Rogene Houston  Time of first page:  [phone]  Responding MD:  Rogene Houston  Time MD responded:  [phone]

## 2017-04-11 NOTE — Evaluation (Signed)
Physical Therapy Evaluation Patient Details Name: Matthew Molina MRN: [id number] DOB: [birth year] Today's Date: 04/11/2017   History of Present Illness  Matthew Molina is a 66 y.o. male On 3 L of oxygen at all times. Patient completed a steroid course about a week ago. Patient with increased shortness of breath over the past several days but much worse in the past 2 days. Persistent coughing occasionally productive. No chest pain no fevers. Patient brought in by EMS. EMS gave him a breathing treatment in route.  Clinical Impression  Pt received from nursing and was agreeable to PT evaluation. Pt from home where he lives alone and typically uses a [facility] or RW for ambulation modified independently and he is still able to drive himself to some of his appointments. Pt was independent with bed mobility, transfers, and mod I with ambulation in his room this date. Pt able to ambulate in room without AD, but he required increased time to complete task. Listed pt as fall risk only due to his IV and oxygen. Pt;s HR was 107 and oxygen was 95% on 2L/min after ambulation in room and transfer to chair. Pt felt as though he was moving at his baseline level of functioning. He c/o LBP during session so PT recommended OPPT referral but he declined. Still recommend OPPT for LBP vs. no follow-up PT. PT signing off acutely.    Follow Up Recommendations Outpatient PT;No PT follow up    Equipment Recommendations  None recommended by PT    Recommendations for Other Services       Precautions / Restrictions Precautions Precautions: Fall Precaution Comments: only due to IV pole and O2 cord negotiation Restrictions Weight Bearing Restrictions: No      Mobility  Bed Mobility Overal bed mobility: Independent                Transfers Overall transfer level: Independent                  Ambulation/Gait Ambulation/Gait assistance: Modified independent (Device/Increase time) Ambulation Distance  (Feet): 8 Feet   Gait Pattern/deviations: WFL(Within Functional Limits) Gait velocity: slow and steady   General Gait Details: limited in room with O2 cord length, pt required increased time to complete task but was able to ambulate without AD and without assistance from PT  Stairs            Wheelchair Mobility    Modified Rankin (Stroke Patients Only)       Balance Overall balance assessment: Independent                                           Pertinent Vitals/Pain Pain Assessment: 0-10 Pain Score: 4  Pain Location: stomach Pain Descriptors / Indicators: Aching Pain Intervention(s): Limited activity within patient's tolerance    Home Living Family/patient expects to be discharged to:: Private residence Living Arrangements: Alone   Type of Home: Apartment (2nd floor) Home Access: Elevator     Home Layout: One level Home Equipment: Environmental consultant - 2 wheels;Cane - single point;Shower seat;Grab bars - toilet      Prior Function Level of Independence: Independent with assistive device(s)         Comments: independent with bathing, dressing, still driving some. could walk about half a block or a block with SPC or RW.     Hand Dominance   Dominant Hand: Right  Extremity/Trunk Assessment   Upper Extremity Assessment Upper Extremity Assessment: Overall WFL for tasks assessed    Lower Extremity Assessment Lower Extremity Assessment: Overall WFL for tasks assessed       Communication   Communication: No difficulties  Cognition Arousal/Alertness: Awake/alert Behavior During Therapy: WFL for tasks assessed/performed Overall Cognitive Status: Within Functional Limits for tasks assessed                                        General Comments      Exercises     Assessment/Plan    PT Assessment All further PT needs can be met in the next venue of care  PT Problem List Decreased activity tolerance;Cardiopulmonary  status limiting activity       PT Treatment Interventions      PT Goals (Current goals can be found in the Care Plan section)  Acute Rehab PT Goals Patient Stated Goal: to go home PT Goal Formulation: With patient Time For Goal Achievement: 04/13/17 Potential to Achieve Goals: Good    Frequency     Barriers to discharge        Co-evaluation               End of Session Equipment Utilized During Treatment: Gait belt;Oxygen Activity Tolerance: Patient tolerated treatment well Patient left: in chair;with call bell/phone within reach Nurse Communication: Mobility status PT Visit Diagnosis: Difficulty in walking, not elsewhere classified (R26.2)    Time: 2174-7159 PT Time Calculation (min) (ACUTE ONLY): 21 min   Charges:   PT Evaluation $PT Eval Low Complexity: 1 Procedure     PT G Codes:   PT G-Codes **NOT FOR INPATIENT CLASS** Functional Assessment Tool Used: AM-PAC 6 Clicks Basic Mobility;Clinical judgement Functional Limitation: Mobility: Walking and moving around Mobility: Walking and Moving Around Current Status (B3967): At least 1 percent but less than 20 percent impaired, limited or restricted Mobility: Walking and Moving Around Goal Status [phone]): At least 1 percent but less than 20 percent impaired, limited or restricted Mobility: Walking and Moving Around Discharge Status [phone]): At least 1 percent but less than 20 percent impaired, limited or restricted      Geraldine Solar PT, DPT

## 2017-04-11 NOTE — ED Provider Notes (Signed)
MSE was initiated and I personally evaluated the patient and placed orders (if any) at  6:55 AM on April 11, 2017.  The patient appears stable so that the remainder of the MSE may be completed by another provider.   Jola Schmidt, MD 04/11/17 [phone]

## 2017-04-11 NOTE — Progress Notes (Signed)
LCSW aware of consult for COPD Gold Protocol Patient does not meet criteria for screening due to having only 1 admission in last 6 months.  COPD gold patients have had 3 or more admissions to hospital in last 6 months.  Will be available if needs arise during hospitalization, please re-consult if needed.  Matthew Fournet LCSW, MSW Clinical Social Work: System Wide Float Coverage for :  [phone] 

## 2017-04-11 NOTE — ED Triage Notes (Signed)
Pt started having SOB that awoke him from sleep at 5:30 this morning. EMS to scene,pt was 91 % on room air- IV established, albuterol nebs x 2, duo neb x 1. Pt has dry cough and wheezing in all fields.

## 2017-04-12 ENCOUNTER — Inpatient Hospital Stay (HOSPITAL_COMMUNITY): Payer: Medicare Other

## 2017-04-12 DIAGNOSIS — E871 Hypo-osmolality and hyponatremia: Secondary | ICD-10-CM

## 2017-04-12 LAB — COMPREHENSIVE METABOLIC PANEL
ALT: 23 U/L (ref 17–63)
AST: 23 U/L (ref 15–41)
Albumin: 3.8 g/dL (ref 3.5–5.0)
Alkaline Phosphatase: 50 U/L (ref 38–126)
Anion gap: 8 (ref 5–15)
BUN: 20 mg/dL (ref 6–20)
CO2: 23 mmol/L (ref 22–32)
Calcium: 9.6 mg/dL (ref 8.9–10.3)
Chloride: 95 mmol/L — ABNORMAL LOW (ref 101–111)
Creatinine, Ser: 1.54 mg/dL — ABNORMAL HIGH (ref 0.61–1.24)
GFR calc Af Amer: 52 mL/min — ABNORMAL LOW (ref >60)
GFR calc non Af Amer: 45 mL/min — ABNORMAL LOW (ref >60)
Glucose, Bld: 204 mg/dL — ABNORMAL HIGH (ref 65–99)
Potassium: 4.4 mmol/L (ref 3.5–5.1)
Sodium: 126 mmol/L — ABNORMAL LOW (ref 135–145)
Total Bilirubin: 0.7 mg/dL (ref 0.3–1.2)
Total Protein: 7.1 g/dL (ref 6.5–8.1)

## 2017-04-12 LAB — RESPIRATORY PANEL BY PCR

## 2017-04-12 LAB — CBC
HCT: 36.6 % — ABNORMAL LOW (ref 39.0–52.0)
Hemoglobin: 12.3 g/dL — ABNORMAL LOW (ref 13.0–17.0)
MCH: 29.4 pg (ref 26.0–34.0)
MCHC: 33.6 g/dL (ref 30.0–36.0)
MCV: 87.4 fL (ref 78.0–100.0)
Platelets: 206 10*3/uL (ref 150–400)
RBC: 4.19 MIL/uL — ABNORMAL LOW (ref 4.22–5.81)
RDW: 13.9 % (ref 11.5–15.5)
WBC: 9.6 10*3/uL (ref 4.0–10.5)

## 2017-04-12 LAB — GLUCOSE, CAPILLARY
Glucose-Capillary: 207 mg/dL — ABNORMAL HIGH (ref 65–99)
Glucose-Capillary: 293 mg/dL — ABNORMAL HIGH (ref 65–99)
Glucose-Capillary: 259 mg/dL — ABNORMAL HIGH (ref 65–99)
Glucose-Capillary: 221 mg/dL — ABNORMAL HIGH (ref 65–99)

## 2017-04-12 LAB — HEMOGLOBIN A1C
Hgb A1c MFr Bld: 6.7 % — ABNORMAL HIGH (ref 4.8–5.6)
Mean Plasma Glucose: 146 mg/dL

## 2017-04-12 LAB — TROPONIN I: Troponin I: 0.03 ng/mL (ref <0.03)

## 2017-04-12 MED ORDER — HYDROCOD POLST-CPM POLST ER 10-8 MG/5ML PO SUER
5.0000 mL | Freq: Once | ORAL | Status: AC
  Administered 2017-04-12: 5 mL via ORAL
  Filled 2017-04-12: qty 5

## 2017-04-12 MED ORDER — INSULIN ASPART 100 UNIT/ML ~~LOC~~ SOLN
3.0000 [IU] | Freq: Three times a day (TID) | SUBCUTANEOUS | Status: DC
  Administered 2017-04-12 – 2017-04-13 (×3): 3 [IU] via SUBCUTANEOUS

## 2017-04-12 MED ORDER — INSULIN ASPART 100 UNIT/ML ~~LOC~~ SOLN
0.0000 [IU] | Freq: Three times a day (TID) | SUBCUTANEOUS | Status: DC
  Administered 2017-04-12: 8 [IU] via SUBCUTANEOUS
  Administered 2017-04-13: 3 [IU] via SUBCUTANEOUS
  Administered 2017-04-13: 5 [IU] via SUBCUTANEOUS

## 2017-04-12 NOTE — Care Management Note (Signed)
Case Management Note  Patient Details  Name: Matthew Molina MRN: [id number] Date of Birth: 1951-10-04  Subjective/Objective:    Adm with COPD exacerbation. From home alone, has neighbor for support. Mostly ind with ADL's but reports neighbor helps with bathing. Has home oxygen for HS use. Reports he wears it  sometimes but sometimes does not due to coughing. Has PCP, still drives at times or neighbor drives him to appointments. Recommended for OP PT. Patient declines.            Action/Plan: Anticipate DC home with self care. Will follow for needs.    Expected Discharge Date:       04/13/2017           Expected Discharge Plan:  Home/Self Care  In-House Referral:     Discharge planning Services  CM Consult  Post Acute Care Choice:    Choice offered to:  NA  DME Arranged:    DME Agency:     HH Arranged:    HH Agency:     Status of Service:  In process, will continue to follow  If discussed at Long Length of Stay Meetings, dates discussed:    Additional Comments:  [name], Chauncey [place], RN 04/12/2017, 11:14 AM

## 2017-04-12 NOTE — Progress Notes (Signed)
Inpatient Diabetes Program Recommendations  AACE/ADA: New Consensus Statement on Inpatient Glycemic Control (2015)  Target Ranges:  Prepandial:   less than 140 mg/dL      Peak postprandial:   less than 180 mg/dL (1-2 hours)      Critically ill patients:  140 - 180 mg/dL   Results for EDWING, FIGLEY (MRN [id number]) as of 04/12/2017 12:20  Ref. Range 04/11/2017 20:18 04/12/2017 07:51 04/12/2017 11:16  Glucose-Capillary Latest Ref Range: 65 - 99 mg/dL 277 (H) 207 (H) 293 (H)  Results for LAURI, TILL (MRN [id number]) as of 04/12/2017 12:20  Ref. Range 02/05/2017 15:37 04/11/2017 13:16  Hemoglobin A1C Latest Ref Range: 4.8 - 5.6 % 6.4 (H) 6.7 (H)   Review of Glycemic Control  Diabetes history: DM2 Outpatient Diabetes medications: Glipizide XL 5 mg QAM Current orders for Inpatient glycemic control: Novolog 0-9 units TID with meals  Inpatient Diabetes Program Recommendations: Correction (SSI): Please consider increasing Novolog correction scale to Moderate (0-15 units) TID with meals and adding Novolog 0-5 units QHS for bedtime correction. Insulin - Meal Coverage: While inpatient and ordered steroids, please consider ordering Novolog 3 units TID with meals for meal coverage if patient eats at least 50% of meals.   Thanks, Barnie Alderman, RN, MSN, CDE Diabetes Coordinator Inpatient Diabetes Program [phone] (Team Pager from 8am to 5pm)

## 2017-04-12 NOTE — Progress Notes (Addendum)
PROGRESS NOTE    Matthew Molina  FKC:[id number] DOB: November 18, 1951 DOA: 04/11/2017 PCP: [name], MD   Brief Narrative: Matthew Molina is a 66 y.o. male with medical history significant of HTN, HLD, Depression, Schizophrenia, COPD Diabetes Mellitus and other comorbid conditions who presented to [facility] with worsening SOB and coughing. Patient states that his SOB started a couple days ago and progressively got worse today. Patient states he has been coughing up intermittent white sputum but none on the day of admission. Last Hospitalization in Feburary with similar complaints.No Nausea or Vomiting but has CP from coughing. No lightheadedness or dizziness.  At baseline wears 3 liters prn and qHS. States he has had persistent coughing and has had similar complaints back in February. [facility] was called to admit for Acute COPD Exacerbation and Acute Bronchitis. Patient states he feels like he is breathing better today.   Assessment & Plan:   Principal Problem:   Acute exacerbation of chronic obstructive pulmonary disease (COPD) (HCC) Active Problems:   Hyperlipidemia   Schizophrenia, unspecified type ([place])   Diabetes mellitus type 2 in obese (HCC)   Tobacco abuse   COPD (chronic obstructive pulmonary disease) (HCC)   Hypertension   CKD (chronic kidney disease) stage 3, GFR 30-59 ml/min   Hypokalemia   Elevated troponin I level  Acute Exacerbation of COPD/Acute Bronchitis -Admit to Inpatient Telemetry -COPD GOLD Orders -C/w Supplemental O2; Neb Treatments with Xopenex 4 Times daily, Atrovent 4 times daily, Dulera 2 puff RT BID, IV Methylprednisolone 60 q6h and then transition to Prednisone 40 mg po daily with Supper; -C/w Flutter Valve, and Incentive Spirometry -C/w Azithromycin 500 mg (04/11/17) po then 250 mg po Daily x4 days (Day 2 of 5) -Patient is Afebrile and no WBC -Counseled on Tobacco Smoking Cessation and provided Nicotine Patch 21 mg TD -Checked Respiratory Virus  Panel and was Negative and Influenza via PCR Negative; Droplet Precautions now Discontinued -Repeat CXR this AM showed Mild Right Base Subsegmental atelectasis otherwise negative chest  Acute on Chronic Respiratory Failure -Likely from Above; Patient wears 3 Liters of O2 via [place] at night and prn -C/w Supplemental O2 as necessary; Wean as Tolerated -C/w Continuous Pulse Oximetry and Maintain O2 Saturations >92% -Patient was saturating 91% on Room Air but placed on O2 for Comfort -If Worsens will consider CTA of Chest to r/o PE  Mildly Elevated Troponin -Stable. -Troponins were 0.04 x4; Repeat Troponin was 0.03 -Likely from CKD; Patient does not describe any CP except when coughing   Tobacco Abuse/Nicotine Addiction -Counseled on Tobacco Smoking Cessation and provided Nicotine Patch 21 mg TD  Hypokalemia, improved -Mild at 3.4 on Admission; Now improved to 4.4 -Continue to Monitor and Replete as necessary -Repeat CMP in AM  Hyponatremia, worsening -Patient's Na+ was 133 and worsened to 126 -D/C'd IVF NS at 75 mL/hr and will fluid Restrict; ?SIADH vs. 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 -Continue to Monitor and Repeat CMP in AM  Hypertension -Hold Nephrotoxic Antihypertensives -Currently Well controlled without medication -If BP elevates will consider IV Hydralazine when necessary  Hyperlipidemia/Hypercholestrolemia -C/w Pravastatin 20 mg po qHS   Diabetes Mellitus Type 2 -Hold Home Medication of Glipizide 5 mg XL -Placed on Sensitive Novolog SSI and now increased to Moderate SSI AC -Added Novolog 3 units with Meals per Diabetes Education Coordinator Recc's -Checked HbA1c and was 6.7 -Hypoglycemic Protocol in Place -CBG's ranging from 207-293  Schizophrenia -C/w Buspar 7.5 mg po TID and with Risperdal 4 mg po Daily  Chronic Kidney Disease Stage 3, at baseline -Held Triamterene-HCTZ and Valsartan and Avoid Nephrotoxics for now -Review of prior Cr shows baseline around  1.2-1.4; Updated Review shows Baseline around 1.4-17 -Creatine worsened from 1.48 -> 1.76 yesterday but now BUN/Cr improved to 20/1.54 -D/C'd Gentle IVF Rehydration NS at 75 mL/hr -Repeat CMP in AM   DVT prophylaxis: Enoxaparin 40 mg sq q24h Code Status: FULL CODE Family Communication: No Family present at bedside Disposition Plan: Home with Outpatient Physical Therapy  Consultants:   None   Procedures: None  Antimicrobials:  Anti-infectives    Start     Dose/Rate Route Frequency Ordered Stop   04/12/17 1000  azithromycin (ZITHROMAX) tablet 250 mg     250 mg Oral Daily 04/11/17 1258 04/16/17 0959   04/11/17 1258  azithromycin (ZITHROMAX) tablet 500 mg     500 mg Oral  Once 04/11/17 1258 04/11/17 1333       Subjective: Seen and examined sitting in chair bedside and states he feels better in terms of his breathing. No nausea or vomiting. Still coughing and states he is able to expectorate a little better today. No other concerns or complaints at this time.   Objective: Vitals:   04/11/17 2015 04/12/17 0134 04/12/17 0630 04/12/17 0700  BP: 132/80  122/89   Pulse: 89  90   Resp: (!) 21  19   Temp:   97.5 F (36.4 C)   TempSrc:   Oral   SpO2: 96% 94% 95%   Weight:    105.6 kg (232 lb 11.2 oz)  Height:        Intake/Output Summary (Last 24 hours) at 04/12/17 0725 Last data filed at 04/12/17 0100  Gross per 24 hour  Intake             1760 ml  Output             2000 ml  Net             -240 ml   Filed Weights   04/11/17 0649 04/11/17 1334 04/12/17 0700  Weight: 102.5 kg (226 lb) 101.8 kg (224 lb 8 oz) 105.6 kg (232 lb 11.2 oz)   Examination: Physical Exam:  Constitutional: WN/WD AAM, NAD and appears calm and comfortable Eyes: Lids and conjunctivae normal, sclerae anicteric  ENMT: External Ears, Nose appear normal. Grossly normal hearing. Mucous membranes are moist.  Neck: Appears normal, supple, no cervical masses, normal ROM, no appreciable thyromegaly, no  JVD Respiratory: Diminished to auscultation bilaterally with some expiratory wheezing worse on the Right vs Left. No rhonchi or crackles. Increased  respiratory effort and patient is tachypenic. No accessory muscle use. Patient wearing Supplemental O2 via [place].  Cardiovascular: RRR, no murmurs / rubs / gallops. S1 and S2 auscultated. No extremity edema.  Abdomen: Soft, non-tender, non-distended. No masses palpated. No appreciable hepatosplenomegaly. Bowel sounds positive x4.  GU: Deferred. Musculoskeletal: No clubbing / cyanosis of digits/nails. No joint deformity upper and lower extremities.  Skin: No rashes, lesions, ulcers on limited skin evaluation. No induration; Warm and dry.  Neurologic: CN 2-12 grossly intact with no focal deficits. Romberg sign cerebellar reflexes not assessed.  Psychiatric: Normal judgment and insight. Alert and oriented x 3. Normal mood and appropriate affect.   Data Reviewed: I have personally reviewed following labs and imaging studies  CBC:  Recent Labs Lab 04/11/17 0706 04/11/17 1316 04/12/17 0125  WBC 6.7 5.2 9.6  HGB 13.4 13.6 12.3*  HCT 40.2 41.9 36.6*  MCV  88.0 88.4 87.4  PLT 206 235 270   Basic Metabolic Panel:  Recent Labs Lab 04/11/17 0706 04/11/17 1316 04/12/17 0125  NA 133*  --  126*  K 3.4*  --  4.4  CL 99*  --  95*  CO2 23  --  23  GLUCOSE 195*  --  204*  BUN 10  --  20  CREATININE 1.48* 1.76* 1.54*  CALCIUM 9.8  --  9.6   GFR: Estimated Creatinine Clearance: 59.4 mL/min (A) (by C-G formula based on SCr of 1.54 mg/dL (H)). Liver Function Tests:  Recent Labs Lab 04/12/17 0125  AST 23  ALT 23  ALKPHOS 50  BILITOT 0.7  PROT 7.1  ALBUMIN 3.8   No results for input(s): LIPASE, AMYLASE in the last 168 hours. No results for input(s): AMMONIA in the last 168 hours. Coagulation Profile: No results for input(s): INR, PROTIME in the last 168 hours. Cardiac Enzymes:  Recent Labs Lab 04/11/17 0706 04/11/17 1006 04/11/17 1316  04/11/17 1713 04/12/17 0125  TROPONINI 0.04* 0.04* 0.04* 0.04* 0.03*   BNP (last 3 results) No results for input(s): PROBNP in the last 8760 hours. HbA1C:  Recent Labs  04/11/17 1316  HGBA1C 6.7*   CBG:  Recent Labs Lab 04/11/17 2018  GLUCAP 277*   Lipid Profile: No results for input(s): CHOL, HDL, LDLCALC, TRIG, CHOLHDL, LDLDIRECT in the last 72 hours. Thyroid Function Tests:  Recent Labs  04/11/17 1316  TSH 0.868   Anemia Panel: No results for input(s): VITAMINB12, FOLATE, FERRITIN, TIBC, IRON, RETICCTPCT in the last 72 hours. Sepsis Labs: No results for input(s): PROCALCITON, LATICACIDVEN in the last 168 hours.  No results found for this or any previous visit (from the past 240 hour(s)).   Radiology Studies: Dg Chest Portable 1 View  Result Date: 04/11/2017 CLINICAL DATA:  65 year old with acute onset of shortness of breath, cough and wheezing which began this morning. EXAM: PORTABLE CHEST 1 VIEW COMPARISON:  02/05/2017, 01/06/2017 and earlier, including screening CT chest 01/11/2017. FINDINGS: Suboptimal inspiration accounts for crowded bronchovascular markings, especially in the bases, and accentuates the cardiac silhouette. Taking this into account, right silhouette normal in size. Thoracic aorta mildly tortuous atherosclerotic, unchanged. Hilar and mediastinal contours otherwise unremarkable. Lungs clear. Bronchovascular markings normal. Pulmonary vascularity normal. No visible pleural effusions. No pneumothorax. IMPRESSION: Suboptimal inspiration.  No acute cardiopulmonary disease. Electronically Signed   By: Evangeline Dakin M.D.   On: 04/11/2017 07:17   Scheduled Meds: . aspirin EC  81 mg Oral Daily  . azithromycin  250 mg Oral Daily  . busPIRone  7.5 mg Oral TID  . dextromethorphan-guaiFENesin  1 tablet Oral BID  . enoxaparin (LOVENOX) injection  40 mg Subcutaneous Q24H  . insulin aspart  0-9 Units Subcutaneous TID WC  . ipratropium  0.5 mg Nebulization Q6H   . levalbuterol  0.63 mg Nebulization Q6H  . mouth rinse  15 mL Mouth Rinse BID  . methylPREDNISolone (SOLU-MEDROL) injection  60 mg Intravenous Q6H   Followed by  . [START ON 04/13/2017] predniSONE  40 mg Oral Q supper  . mometasone-formoterol  2 puff Inhalation BID  . nicotine  21 mg Transdermal Daily  . pantoprazole  40 mg Oral Daily  . pravastatin  20 mg Oral q1800  . risperidone  4 mg Oral QHS  . sodium chloride HYPERTONIC  4 mL Nebulization Daily   Continuous Infusions: . sodium chloride 75 mL/hr at 04/12/17 0050    LOS: 1 day   Georgina Quint  Lise Auer, DO [facility] Pager [phone]  If 7PM-7AM, please contact night-coverage [email or web address] Password [email or web address] 04/12/2017, 7:25 AM

## 2017-04-12 NOTE — Progress Notes (Signed)
OT Screen Note  Patient Details Name: Matthew Molina MRN: [id number] DOB: July 14, 1951   Cancelled Treatment:    Reason Eval/Treat Not Completed: OT screened, no needs identified, will sign off. Patient's chart reviewed and discussed progress with PT. Patient is currently functioning independently with all daily tasks.    Ailene Ravel, OTR/L,CBIS  [phone] 04/12/2017, 8:26 AM

## 2017-04-12 NOTE — Progress Notes (Signed)
Pt requesting something stronger for cough and stating Mucinex and Tessalon Pearls not working. Dr. Baltazar Najjar paged and made aware. Waiting on orders.

## 2017-04-12 NOTE — Progress Notes (Signed)
Initial Nutrition Assessment   INTERVENTION:  Snacks BID -nutrition services will provide and leave in nourishment room refridgerator   NUTRITION DIAGNOSIS:   Decreased protein needs related to impaired renal function as evidenced by CKD-3    GOAL:   Patient will meet greater than or equal to 90% of their needs   MONITOR:   PO intake, Labs, Weight trends  REASON FOR ASSESSMENT:   Consult COPD Protocol  ASSESSMENT: Matthew Molina is a 66 yo with hx of CKD-3, DM-2, Depression and COPD. He presents with increased breathing difficulty. He was found to have AKI, Acute on chronic COPD exacerbation.  He is eating well 75-100% of meals and is complaining of getting hungry between meals. We talked about his snack preferences and nutrition services will provide. Feeds himself.  Weight hx shows dip in wt back in late January but has rebounded now close to UBW of 106-108 kg.   Nutrition-Focused physical exam completed. Findings are no fat depletion, mild temporal muscle depletion, and no edema. No overt signs of micronutrient deficiencies.    Recent Labs Lab 04/11/17 0706 04/11/17 1316 04/12/17 0125  NA 133*  --  126*  K 3.4*  --  4.4  CL 99*  --  95*  CO2 23  --  23  BUN 10  --  20  CREATININE 1.48* 1.76* 1.54*  CALCIUM 9.8  --  9.6  GLUCOSE 195*  --  204*  Labs: hyponatremia and hyperglycemia.  Meds: SSi, prednisone, protonix  Diet Order:  Diet heart healthy/carb modified Room service appropriate? Yes; Fluid consistency: Thin  Skin:  Reviewed, no issues  Last BM:  4/25  Height:   Ht Readings from Last 1 Encounters:  04/11/17 6\' 1"  (1.854 m)    Weight:   Wt Readings from Last 1 Encounters:  04/12/17 232 lb 11.2 oz (105.6 kg)    Ideal Body Weight:  83.6 kg  BMI:  Body mass index is 30.7 kg/m.  Estimated Nutritional Needs:   Kcal:  2208-2392  ([place] AF 1.2-1.3 + Adj wt for CKD-3)  Protein:  81-91 gr (0.8-0.9 gr/kg)   Fluid:  >2.0 liters  daily  EDUCATION NEEDS:   No education needs identified at this time  Colman Cater MS,RD,CSG,LDN Office: #[phone] Pager: [phone]

## 2017-04-13 LAB — CBC WITH DIFFERENTIAL/PLATELET
Basophils Absolute: 0 10*3/uL (ref 0.0–0.1)
Basophils Relative: 0 %
Eosinophils Absolute: 0 10*3/uL (ref 0.0–0.7)
Eosinophils Relative: 0 %
HCT: 37.3 % — ABNORMAL LOW (ref 39.0–52.0)
Hemoglobin: 12.4 g/dL — ABNORMAL LOW (ref 13.0–17.0)
Lymphocytes Relative: 7 %
Lymphs Abs: 0.9 10*3/uL (ref 0.7–4.0)
MCH: 29.2 pg (ref 26.0–34.0)
MCHC: 33.2 g/dL (ref 30.0–36.0)
MCV: 88 fL (ref 78.0–100.0)
Monocytes Absolute: 0.6 10*3/uL (ref 0.1–1.0)
Monocytes Relative: 4 %
Neutro Abs: 12.3 10*3/uL — ABNORMAL HIGH (ref 1.7–7.7)
Neutrophils Relative %: 89 %
Platelets: 233 10*3/uL (ref 150–400)
RBC: 4.24 MIL/uL (ref 4.22–5.81)
RDW: 14.4 % (ref 11.5–15.5)
WBC: 13.7 10*3/uL — ABNORMAL HIGH (ref 4.0–10.5)

## 2017-04-13 LAB — COMPREHENSIVE METABOLIC PANEL
ALT: 25 U/L (ref 17–63)
AST: 32 U/L (ref 15–41)
Albumin: 3.9 g/dL (ref 3.5–5.0)
Alkaline Phosphatase: 46 U/L (ref 38–126)
Anion gap: 9 (ref 5–15)
BUN: 27 mg/dL — ABNORMAL HIGH (ref 6–20)
CO2: 22 mmol/L (ref 22–32)
Calcium: 9.9 mg/dL (ref 8.9–10.3)
Chloride: 98 mmol/L — ABNORMAL LOW (ref 101–111)
Creatinine, Ser: 1.53 mg/dL — ABNORMAL HIGH (ref 0.61–1.24)
GFR calc Af Amer: 53 mL/min — ABNORMAL LOW (ref >60)
GFR calc non Af Amer: 45 mL/min — ABNORMAL LOW (ref >60)
Glucose, Bld: 260 mg/dL — ABNORMAL HIGH (ref 65–99)
Potassium: 4.2 mmol/L (ref 3.5–5.1)
Sodium: 129 mmol/L — ABNORMAL LOW (ref 135–145)
Total Bilirubin: 0.6 mg/dL (ref 0.3–1.2)
Total Protein: 7.2 g/dL (ref 6.5–8.1)

## 2017-04-13 LAB — GLUCOSE, CAPILLARY
Glucose-Capillary: 197 mg/dL — ABNORMAL HIGH (ref 65–99)
Glucose-Capillary: 215 mg/dL — ABNORMAL HIGH (ref 65–99)

## 2017-04-13 LAB — PHOSPHORUS: Phosphorus: 3.1 mg/dL (ref 2.5–4.6)

## 2017-04-13 LAB — MAGNESIUM: Magnesium: 2.1 mg/dL (ref 1.7–2.4)

## 2017-04-13 MED ORDER — PREDNISONE 20 MG PO TABS: 40.0000 mg | ORAL_TABLET | Freq: Every day | ORAL | 0 refills | Status: DC

## 2017-04-13 MED ORDER — AZITHROMYCIN 250 MG PO TABS: 250.0000 mg | ORAL_TABLET | Freq: Every day | ORAL | 0 refills | Status: AC

## 2017-04-13 MED ORDER — NICOTINE 21 MG/24HR TD PT24: 21.0000 mg | MEDICATED_PATCH | Freq: Every day | TRANSDERMAL | 0 refills | Status: DC

## 2017-04-13 MED ORDER — DM-GUAIFENESIN ER 30-600 MG PO TB12: 1.0000 | ORAL_TABLET | Freq: Two times a day (BID) | ORAL | 0 refills | Status: DC

## 2017-04-13 MED ORDER — BENZONATATE 100 MG PO CAPS: 100.0000 mg | ORAL_CAPSULE | Freq: Three times a day (TID) | ORAL | 0 refills | Status: DC | PRN

## 2017-04-13 MED ORDER — SENNOSIDES-DOCUSATE SODIUM 8.6-50 MG PO TABS: 1.0000 | ORAL_TABLET | Freq: Every evening | ORAL | 0 refills | Status: DC | PRN

## 2017-04-13 NOTE — ACP (Advance Care Planning) (Signed)
Discussed Advance Directives with Matthew Molina. He shared he wanted to wait at this time. Gave him the information and shared that we could do it on an outpatient basis also at his convenience.   He also asked that I pray for him before leaving. Prayers for his health and other needs.

## 2017-04-13 NOTE — Discharge Summary (Signed)
Physician Discharge Summary  JKAI ARWOOD RKY:[id number] DOB: 31-Aug-1951 DOA: 04/11/2017  PCP: [name], MD  Admit date: 04/11/2017 Discharge date: 04/13/2017  Admitted From: Home Disposition:  Home  Recommendations for Outpatient Follow-up:  1. Follow up with PCP in 1-2 weeks (Has appointment Next week) 2. Please obtain CMP/CBC in one week 3. Follow up with Outpatient Physical Therapy  Home Health: No Equipment/Devices: None  Discharge Condition: Stable CODE STATUS: FULL CODE Diet recommendation: Heart Healthy / Carb Modified  Brief/Interim Summary: Matthew Molina a 66 y.o.malewith medical history significant of HTN, HLD, Depression, Schizophrenia, COPD, Diabetes Mellitus and other comorbid conditions who presented to [facility] with worsening SOB and coughing. Patient states that his SOB started a couple days ago and progressively got worse today. Patient states he has been coughing up intermittent white sputum but none on the day of admission. Last Hospitalization in Feburary with similar complaints. No Nausea or Vomitingbut has CPfrom coughing. No lightheadedness or dizziness. At baseline wears 3 liters prn and qHS. States he has had persistent coughing and has had similar complaints back in February. [facility] was called to admit for Acute COPD Exacerbation and Acute Bronchitis. Patient was placed on Nebs, Steroids, Abx and Supplemental O2. Respiratory Viral Panel and Influenza PCR were negative. Patient was given cough suppressants. He subsequently improved in his breathing and was doing quite well. PT evaluated and recommended Outpatient PT follow up. Patient deemed medically stable at this time and will D/C home and will need to follow up with PCP in 1 week.   Discharge Diagnoses:  Principal Problem:   Acute exacerbation of chronic obstructive pulmonary disease (COPD) ([place]) Active Problems:   Hyperlipidemia   Schizophrenia, unspecified type ([place])   Diabetes  mellitus type 2 in obese (H[facility])   Tobacco abuse   COPD (chronic obstructive pulmonary disease) ([facility])   Hypertension   CKD (chronic kidney disease) stage 3, GFR 30-59 ml/min   Hypokalemia   Elevated troponin I level  Acute Exacerbation of COPD/Acute Bronchitis, improving -COPD GOLD Orders -C/w Supplemental O2; and Home Neb Treatments  -C/w Dulera 2 puff RT BID, Given IV Methylprednisolone 60 q6h and then transitioned to Prednisone 40 mg po daily with Supper (Has 4 more days left); -C/w Flutter Valve, and Incentive Spirometry -C/w Azithromycin 500 mg (04/11/17) po then 250 mg po Daily x4 days (Day 3 of 5) -Patient is Afebrile and no WBC -Counseled on Tobacco Smoking Cessation and provided Nicotine Patch 21 mg TD -Checked Respiratory Virus Panel and was Negative and Influenza via PCR Negative; Droplet Precautions now Discontinued -Repeat CXR yesterdat AM showed Mild Right Base Subsegmental atelectasis otherwise negative chest -Follow up with PCP at D/C and has appointment next week  Acute on Chronic Respiratory Failure -Likely from Above; Patient wears 3 Liters of O2 via [place] at night and prn -C/w Supplemental O2 as necessary; Wean as Tolerated -C/w Continuous Pulse Oximetry and Maintain O2 Saturations >92% -Patient was saturating 91% on Room Air but placed on O2 for Comfort -If Worsens will consider CTA of Chest to r/o PE  Mildly Elevated Troponin -Stable. -Troponins were 0.04 x4; Repeat Troponin was 0.03 -Likely from CKD; Patient does not describe any CP except when coughing   Tobacco Abuse/Nicotine Addiction -Counseled on Tobacco Smoking Cessation and provided Nicotine Patch 21 mg TD  Hypokalemia, improved -Mild at 3.4 on Admission; Now improved to 4.2 -Continue to Monitor and Replete as necessary -Repeat CMP at PCP's office  Hyponatremia, improving -Patient's Na+ was  133 and worsened to 126 initially and now improved -D/C'd IVF NS at 75 mL/hr and will fluid Restrict;  ?SIADH vs. 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 -Continue to Monitor and Repeat CMP at PCP's office  Hypertension -Held Nephrotoxic Antihypertensives during admission -Ok to restart Valsartan; -Will D/C Triamterene-HCTZ and defer to PCP to restart  Hyperlipidemia/Hypercholestrolemia -C/w Pravastatin 20 mg po qHS  Diabetes Mellitus Type 2 -Restart Home Medication of Glipizide 5 mg XL -Was on Sensitive Novolog SSI and now increased to Moderate SSI AC because of Steroids -Added Novolog 3 units with Meals per Diabetes Education Coordinator Recc's -Checked HbA1c and was 6.7 -Hypoglycemic Protocol in Place -CBG's ranging from 197-259  Schizophrenia -C/w Buspar 7.5 mg po TID and with Risperdal 4 mg po Daily   Chronic Kidney Disease Stage 3, at baseline -Discontinued Triamterene-HCTZ and ok to restart Valsartan -Review of prior Cr shows baseline around 1.2-1.4; Updated Review shows Baseline around 1.4-17 -Creatine worsened from 1.48 -> 1.76 yesterday but now BUN/Cr improved to 20/1.54 -D/C'd Gentle IVF Rehydration NS at 75 mL/hr -Repeat CMP at SNF  Leukocytosis -Likely Demargination for IV Steroids -WBC went from 9.6 -> 13.7 -No S/Sx of Infection -Repeat CBC as an outpatient   Discharge Instructions  Discharge Instructions    Call MD for:  difficulty breathing, headache or visual disturbances    Complete by:  As directed    Call MD for:  extreme fatigue    Complete by:  As directed    Call MD for:  persistant dizziness or light-headedness    Complete by:  As directed    Call MD for:  persistant nausea and vomiting    Complete by:  As directed    Call MD for:  severe uncontrolled pain    Complete by:  As directed    Call MD for:  temperature >100.4    Complete by:  As directed    Diet - low sodium heart healthy    Complete by:  As directed    Discharge instructions    Complete by:  As directed    Follow up with PCP at discharge and take all medications as prescribed. If  symptoms change or worsen please return to the ED for evaluation.   Increase activity slowly    Complete by:  As directed      Allergies as of 04/13/2017      Reactions   Sertraline Hcl    Stomach upset/pain   Wellbutrin [bupropion] Other (See Comments)   Makes stomach hurt      Medication List    STOP taking these medications   triamterene-hydrochlorothiazide 37.5-25 MG tablet Commonly known as:  MAXZIDE-25     TAKE these medications   ACCU-CHEK SOFTCLIX LANCETS lancets TEST BLOOD SUGAR ONCE DAILY AS DIRECTED.   acetaminophen 500 MG tablet Commonly known as:  TYLENOL Take 500 mg by mouth daily as needed for moderate pain.   albuterol 108 (90 Base) MCG/ACT inhaler Commonly known as:  PROVENTIL HFA;VENTOLIN HFA Inhale 2 puffs into the lungs every 6 (six) hours as needed for wheezing or shortness of breath.   aspirin EC 81 MG tablet Take 81 mg by mouth daily.   azithromycin 250 MG tablet Commonly known as:  ZITHROMAX Take 1 tablet (250 mg total) by mouth daily.   benzonatate 100 MG capsule Commonly known as:  TESSALON Take 1 capsule (100 mg total) by mouth 3 (three) times daily as needed for cough.   busPIRone 7.5 MG tablet Commonly known as:  BUSPAR Take 1 tablet (7.5 mg total) by mouth 3 (three) times daily.   dextromethorphan-guaiFENesin 30-600 MG 12hr tablet Commonly known as:  MUCINEX DM Take 1 tablet by mouth 2 (two) times daily.   GLIPIZIDE XL 5 MG 24 hr tablet Generic drug:  glipiZIDE TAKE 1 TABLET DAILY WITH BREAKFAST.   glucose blood test strip Commonly known as:  ACCU-CHEK AVIVA PLUS Once daily testing E11.9, E66.9   ipratropium-albuterol 0.5-2.5 (3) MG/3ML Soln Commonly known as:  DUONEB INHALE 1 VIAL VIA NEBULIZER EVERY FOUR HOURS AS NEEDED.   nicotine 21 mg/24hr patch Commonly known as:  NICODERM CQ - dosed in mg/24 hours Place 1 patch (21 mg total) onto the skin daily.   omeprazole 20 MG capsule Commonly known as:  PRILOSEC TAKE 1  CAPSULE BY MOUTH 30 MINUTES PRIOR TO BREAKFAST AND SUPPER.   polyethylene glycol powder powder Commonly known as:  GLYCOLAX/MIRALAX MIX 1 CAPFUL (17G) IN 8 OUNCES OF JUICE/WATER AND DRINK ONCE DAILY.   pravastatin 20 MG tablet Commonly known as:  PRAVACHOL TAKE (1) TABLET BY MOUTH AT BEDTIME FOR CHOLESTEROL.   predniSONE 20 MG tablet Commonly known as:  DELTASONE Take 2 tablets (40 mg total) by mouth daily with supper.   risperidone 4 MG tablet Commonly known as:  RISPERDAL TAKE (1) TABLET BY MOUTH AT BEDTIME.   risperiDONE 1 MG tablet Commonly known as:  RISPERDAL TAKE (1) TABLET BY MOUTH AT BEDTIME WITH 4MG .   senna-docusate 8.6-50 MG tablet Commonly known as:  Senokot-S Take 1 tablet by mouth at bedtime as needed for mild constipation.   SYMBICORT 80-4.5 MCG/ACT inhaler Generic drug:  budesonide-formoterol INHALE 2 PUFFS INTO LUNGS TWICE DAILY.   traZODone 50 MG tablet Commonly known as:  DESYREL TAKE ONE TABLET BY MOUTH AT BEDTIME AS NEEDED FOR SLEEP.   valsartan 320 MG tablet Commonly known as:  DIOVAN TAKE ONE TABLET BY MOUTH DAILY.      Follow-up Information    [name], MD Follow up in 1 week(s).   Specialty:  Family Medicine Why:  Call to schedule appointment within 1 week Contact information: [address] [place] [address] [phone]          Allergies  Allergen Reactions  . Sertraline Hcl     Stomach upset/pain  . Wellbutrin [Bupropion] Other (See Comments)    Makes stomach hurt   Consultations:  None  Procedures/Studies: Portable Chest 1 View  Result Date: 04/12/2017 CLINICAL DATA:  Shortness of breath . EXAM: PORTABLE CHEST 1 VIEW COMPARISON:  04/11/2017 . FINDINGS: Mediastinum and hilar structures normal. Mild right base subsegmental atelectasis. No pleural effusion or pneumothorax. Heart size stable . IMPRESSION: Mild right base subsegmental atelectasis otherwise negative chest. Electronically Signed   By:  Marcello Moores  Register   On: 04/12/2017 07:38   Dg Chest Portable 1 View  Result Date: 04/11/2017 CLINICAL DATA:  66 year old with acute onset of shortness of breath, cough and wheezing which began this morning. EXAM: PORTABLE CHEST 1 VIEW COMPARISON:  02/05/2017, 01/06/2017 and earlier, including screening CT chest 01/11/2017. FINDINGS: Suboptimal inspiration accounts for crowded bronchovascular markings, especially in the bases, and accentuates the cardiac silhouette. Taking this into account, right silhouette normal in size. Thoracic aorta mildly tortuous atherosclerotic, unchanged. Hilar and mediastinal contours otherwise unremarkable. Lungs clear. Bronchovascular markings normal. Pulmonary vascularity normal. No visible pleural effusions. No pneumothorax. IMPRESSION: Suboptimal inspiration.  No acute cardiopulmonary disease. Electronically Signed   By: Evangeline Dakin M.D.   On:  04/11/2017 07:17     Subjective: Seen and examined and was breathing much better. No nausea or vomiting. Ready to go home.   Discharge Exam: Vitals:   04/12/17 2042 04/13/17 0355  BP: 114/66 127/81  Pulse: 82 100  Resp: 15 19  Temp:  97.5 F (36.4 C)   Vitals:   04/12/17 2042 04/13/17 0100 04/13/17 0355 04/13/17 0928  BP: 114/66  127/81   Pulse: 82  100   Resp: 15  19   Temp:   97.5 F (36.4 C)   TempSrc:   Axillary   SpO2: 98% 98% 96% 98%  Weight:   106.2 kg (234 lb 1.6 oz)   Height:       General: Pt is alert, awake, not in acute distress Cardiovascular: RRR, S1/S2 +, no rubs, no gallops Respiratory: Diminished bilaterally with slight wheezing, no rhonchi; Patient not tachypenic or using any accessory muscles to breathe.  Abdominal: Soft, NT, ND, bowel sounds + Extremities: no edema, no cyanosis  The results of significant diagnostics from this hospitalization (including imaging, microbiology, ancillary and laboratory) are listed below for reference.    Microbiology: Recent Results (from the past 240  hour(s))  Respiratory Panel by PCR     Status: None   Collection Time: 04/11/17 12:59 PM  Result Value Ref Range Status   Adenovirus NOT DETECTED NOT DETECTED Final   Coronavirus 229E NOT DETECTED NOT DETECTED Final   Coronavirus HKU1 NOT DETECTED NOT DETECTED Final   Coronavirus NL63 NOT DETECTED NOT DETECTED Final   Coronavirus OC43 NOT DETECTED NOT DETECTED Final   Metapneumovirus NOT DETECTED NOT DETECTED Final   Rhinovirus / Enterovirus NOT DETECTED NOT DETECTED Final   Influenza A NOT DETECTED NOT DETECTED Final   Influenza B NOT DETECTED NOT DETECTED Final   Parainfluenza Virus 1 NOT DETECTED NOT DETECTED Final   Parainfluenza Virus 2 NOT DETECTED NOT DETECTED Final   Parainfluenza Virus 3 NOT DETECTED NOT DETECTED Final   Parainfluenza Virus 4 NOT DETECTED NOT DETECTED Final   Respiratory Syncytial Virus NOT DETECTED NOT DETECTED Final   Bordetella pertussis NOT DETECTED NOT DETECTED Final   Chlamydophila pneumoniae NOT DETECTED NOT DETECTED Final   Mycoplasma pneumoniae NOT DETECTED NOT DETECTED Final    Comment: Performed at [facility], [place] [address] [place],  [address]    Labs: BNP (last 3 results)  Recent Labs  06/15/16 0826  BNP 73.5   Basic Metabolic Panel:  Recent Labs Lab 04/11/17 0706 04/11/17 1316 04/12/17 0125 04/13/17 0933  NA 133*  --  126* 129*  K 3.4*  --  4.4 4.2  CL 99*  --  95* 98*  CO2 23  --  23 22  GLUCOSE 195*  --  204* 260*  BUN 10  --  20 27*  CREATININE 1.48* 1.76* 1.54* 1.53*  CALCIUM 9.8  --  9.6 9.9  MG  --   --   --  2.1  PHOS  --   --   --  3.1   Liver Function Tests:  Recent Labs Lab 04/12/17 0125 04/13/17 0933  AST 23 32  ALT 23 25  ALKPHOS 50 46  BILITOT 0.7 0.6  PROT 7.1 7.2  ALBUMIN 3.8 3.9   No results for input(s): LIPASE, AMYLASE in the last 168 hours. No results for input(s): AMMONIA in the last 168 hours. CBC:  Recent Labs Lab 04/11/17 0706 04/11/17 1316 04/12/17 0125 04/13/17 0933   WBC 6.7 5.2  9.6 13.7*  NEUTROABS  --   --   --  12.3*  HGB 13.4 13.6 12.3* 12.4*  HCT 40.2 41.9 36.6* 37.3*  MCV 88.0 88.4 87.4 88.0  PLT 206 235 206 233   Cardiac Enzymes:  Recent Labs Lab 04/11/17 0706 04/11/17 1006 04/11/17 1316 04/11/17 1713 04/12/17 0125  TROPONINI 0.04* 0.04* 0.04* 0.04* 0.03*   BNP: Invalid input(s): POCBNP CBG:  Recent Labs Lab 04/12/17 1116 04/12/17 1619 04/12/17 2044 04/13/17 0755 04/13/17 1104  GLUCAP 293* 259* 221* 197* 215*   D-Dimer No results for input(s): DDIMER in the last 72 hours. Hgb A1c  Recent Labs  04/11/17 1316  HGBA1C 6.7*   Lipid Profile No results for input(s): CHOL, HDL, LDLCALC, TRIG, CHOLHDL, LDLDIRECT in the last 72 hours. Thyroid function studies  Recent Labs  04/11/17 1316  TSH 0.868   Anemia work up No results for input(s): VITAMINB12, FOLATE, FERRITIN, TIBC, IRON, RETICCTPCT in the last 72 hours. Urinalysis    Component Value Date/Time   COLORURINE lt. yellow 02/25/2009 1257   APPEARANCEUR Clear 02/25/2009 1257   LABSPEC 1.010 02/25/2009 1257   PHURINE 6.0 02/25/2009 1257   HGBUR negative 02/25/2009 1257   BILIRUBINUR negative 02/25/2009 1257   UROBILINOGEN 0.2 02/25/2009 1257   NITRITE negative 02/25/2009 1257   Sepsis Labs Invalid input(s): PROCALCITONIN,  WBC,  LACTICIDVEN Microbiology Recent Results (from the past 240 hour(s))  Respiratory Panel by PCR     Status: None   Collection Time: 04/11/17 12:59 PM  Result Value Ref Range Status   Adenovirus NOT DETECTED NOT DETECTED Final   Coronavirus 229E NOT DETECTED NOT DETECTED Final   Coronavirus HKU1 NOT DETECTED NOT DETECTED Final   Coronavirus NL63 NOT DETECTED NOT DETECTED Final   Coronavirus OC43 NOT DETECTED NOT DETECTED Final   Metapneumovirus NOT DETECTED NOT DETECTED Final   Rhinovirus / Enterovirus NOT DETECTED NOT DETECTED Final   Influenza A NOT DETECTED NOT DETECTED Final   Influenza B NOT DETECTED NOT DETECTED Final    Parainfluenza Virus 1 NOT DETECTED NOT DETECTED Final   Parainfluenza Virus 2 NOT DETECTED NOT DETECTED Final   Parainfluenza Virus 3 NOT DETECTED NOT DETECTED Final   Parainfluenza Virus 4 NOT DETECTED NOT DETECTED Final   Respiratory Syncytial Virus NOT DETECTED NOT DETECTED Final   Bordetella pertussis NOT DETECTED NOT DETECTED Final   Chlamydophila pneumoniae NOT DETECTED NOT DETECTED Final   Mycoplasma pneumoniae NOT DETECTED NOT DETECTED Final    Comment: Performed at [facility], [place] [address] [place], [place] [address]   Time coordinating discharge: 35 minutes  SIGNED:  Kerney Elbe, DO [facility] 04/13/2017, 11:35 AM Pager [phone]  If 7PM-7AM, please contact night-coverage [email or web address] Password [email or web address]

## 2017-04-14 NOTE — Progress Notes (Signed)
Discharged home with instructions given on medications and follow up visits,patient verbalized understanding. Prescriptions sent to Pharmacy of choice documented on AVS. Accompanied by staff to an awaiting vehicle. 

## 2017-04-16 ENCOUNTER — Telehealth: Payer: Self-pay | Admitting: Family Medicine

## 2017-04-16 NOTE — Telephone Encounter (Signed)
I spoke directly with the pt, d/c on 04/13/2017 , he understands that he was hospitalized for COPD exacerbation. States his breathing is better. States he is down to one more tablet that he was d/c on. Knows that he has a 1pm f/u appt tomorrow and he will bring all medications to the visit. Car not functioning 100% but states he will make the appt

## 2017-04-17 ENCOUNTER — Ambulatory Visit (INDEPENDENT_AMBULATORY_CARE_PROVIDER_SITE_OTHER): Payer: Medicare Other | Admitting: Family Medicine

## 2017-04-17 ENCOUNTER — Encounter: Payer: Self-pay | Admitting: Family Medicine

## 2017-04-17 ENCOUNTER — Other Ambulatory Visit (HOSPITAL_COMMUNITY)
Admission: AD | Admit: 2017-04-17 | Discharge: 2017-04-17 | Disposition: A | Discharge status: Home / Self Care | Payer: Medicare Other | Source: Skilled Nursing Facility | Attending: Family Medicine | Admitting: Family Medicine

## 2017-04-17 VITALS — BP 120/82 | HR 99 | Resp 16 | Ht 73.0 in | Wt 235.0 lb

## 2017-04-17 DIAGNOSIS — Z125 Encounter for screening for malignant neoplasm of prostate: Secondary | ICD-10-CM | POA: Diagnosis not present

## 2017-04-17 DIAGNOSIS — M549 Dorsalgia, unspecified: Secondary | ICD-10-CM | POA: Diagnosis not present

## 2017-04-17 DIAGNOSIS — E669 Obesity, unspecified: Secondary | ICD-10-CM

## 2017-04-17 DIAGNOSIS — J449 Chronic obstructive pulmonary disease, unspecified: Secondary | ICD-10-CM | POA: Diagnosis not present

## 2017-04-17 DIAGNOSIS — R2681 Unsteadiness on feet: Secondary | ICD-10-CM | POA: Diagnosis not present

## 2017-04-17 DIAGNOSIS — Z720 Tobacco use: Secondary | ICD-10-CM | POA: Diagnosis not present

## 2017-04-17 DIAGNOSIS — Z09 Encounter for follow-up examination after completed treatment for conditions other than malignant neoplasm: Secondary | ICD-10-CM

## 2017-04-17 DIAGNOSIS — I10 Essential (primary) hypertension: Secondary | ICD-10-CM

## 2017-04-17 DIAGNOSIS — E1169 Type 2 diabetes mellitus with other specified complication: Secondary | ICD-10-CM

## 2017-04-17 DIAGNOSIS — E785 Hyperlipidemia, unspecified: Secondary | ICD-10-CM

## 2017-04-17 MED ORDER — TIOTROPIUM BROMIDE MONOHYDRATE 18 MCG IN CAPS: 18.0000 ug | ORAL_CAPSULE | Freq: Every day | RESPIRATORY_TRACT | 12 refills | Status: DC

## 2017-04-17 NOTE — Patient Instructions (Signed)
f/u in 3.5 month, call if you need me sooner  Please restrict water intake to 64 ounces daily  Fasting lipid, cmp and eGFR and HBa1C and PSA 1 week before next visit  Cut back by one cigarette each week  You are being referred for in home PT twice weekly for 4 weeks dueto unsteady feet and loss of balance  Thank you  for choosing [facility]. We consider it a privelige to serve you.  Delivering excellent health care in a caring and  compassionate way is our goal.  Partnering with you,  so that together we can achieve this goal is our strategy.

## 2017-04-18 DIAGNOSIS — R2681 Unsteadiness on feet: Secondary | ICD-10-CM | POA: Insufficient documentation

## 2017-04-18 LAB — MICROALBUMIN / CREATININE URINE RATIO
Creatinine, Urine: 73.4 mg/dL
Microalb Creat Ratio: <4.1 mg/g{creat} (ref 0.0–30.0)
Microalb, Ur: <3 ug/mL — ABNORMAL HIGH

## 2017-04-18 NOTE — Assessment & Plan Note (Signed)
Controlled, no change in medication DASH diet and commitment to daily physical activity for a minimum of 30 minutes discussed and encouraged, as a part of hypertension management. The importance of attaining a healthy weight is also discussed.  BP/Weight 04/17/2017 04/13/2017 02/06/2017 02/05/2017 01/06/2017 12/28/2016 75/79/7282  Systolic BP 060 156 153 - 794 327 -  Diastolic BP 82 69 83 - 89 74 -  Wt. (Lbs) 235 234.1 221.78 - 220 227 235  BMI 31 30.89 - 29.26 29.03 29.95 31  Some encounter information is confidential and restricted. Go to Review Flowsheets activity to see all data.

## 2017-04-18 NOTE — Assessment & Plan Note (Signed)
Hyperlipidemia:Low fat diet discussed and encouraged.   Lipid Panel  Lab Results  Component Value Date   CHOL 116 (L) 09/27/2016   HDL 34 (L) 09/27/2016   LDLCALC 48 09/27/2016   TRIG 168 (H) 09/27/2016   CHOLHDL 3.4 09/27/2016   Not at goal

## 2017-04-18 NOTE — Assessment & Plan Note (Signed)
Chronic back pain due to arthritis and disc disease, contributes to unsteady gait and lower extremity weakness, in home pT to help with strengthening and safety

## 2017-04-18 NOTE — Assessment & Plan Note (Signed)

## 2017-04-18 NOTE — Progress Notes (Signed)
Matthew Molina     MRN: [id number]      DOB: July 09, 1951   HPI Matthew Molina  Patient in for follow up of recent hospitalization from 4/25 to 04/13/2017 for acute COPD flare Discharge summary, and laboratory and radiology data are reviewed, and any questions or concerns about recent hospitalization are discussed. Specific issues requiring follow up are specifically addressed. Pt and his caregiver report that he does have access to and takes his medications regularly, the major underlying problem is that he continues to smoke on avg 1PPD and she also smokes even more, he will think about this and work on cutting back D/C summary reports needs fort physical therapy and he reports unsteady gait and weakness, he does have arthritis of the spine, he is being referred for in home PT twice weekly for 4 weeks, he has transportation challenges   ROS Denies recent fever or chills. Denies sinus pressure, nasal congestion, ear pain or sore throat.  Denies chest pains, palpitations and leg swelling Denies abdominal pain, nausea, vomiting,diarrhea or constipation.   Denies dysuria, frequency, hesitancy or incontinence.  Denies headaches, seizures, numbness, or tingling. Denies depression, anxiety or insomnia. Denies skin break down or rash.   PE  BP 120/82   Pulse 99   Resp 16   Ht 6\' 1"  (1.854 m)   Wt 235 lb (106.6 kg)   SpO2 96%   BMI 31.00 kg/m   Patient alert and oriented and in no cardiopulmonary distress.  HEENT: No facial asymmetry, EOMI,   oropharynx pink and moist.  Neck supple no JVD, no mass.  Chest: decreased though adequate air entry, few scattered wheezes, no crackles  CVS: S1, S2 no murmurs, no S3.Regular rate.  ABD: Soft non tender.   Ext: No edema  MS: dEcreased  ROM spine, shoulders, hips and knees.  Skin: Intact, no ulcerations or rash noted.  Psych: Good eye contact, normal affect. Memory intact not anxious or depressed appearing.  CNS: CN 2-12 intact,     Assessment & Plan  Hospital discharge follow-up Improved and clinically stable. NEEDS to stop smoking to reduce disease burden and improve health, he hears and understands this and is willing to work on this Spiriva renewed , no changes in chronic medications. In  Home PT twice weekly for 4 weeks for improved gait and reduced fall risk to be arranged  Back pain with radiation Chronic back pain due to arthritis and disc disease, contributes to unsteady gait and lower extremity weakness, in home pT to help with strengthening and safety  Unsteady gait Reports unsteady gait and near falls, also recent PT evaluation in hospital recommends need for PT to improve gait and reduce fall risk referred for 4 weeks of bi weekly sessions  Tobacco abuse Patient counseled for approximately 5 minutes regarding the health risks of ongoing nicotine use, specifically all types of cancer, heart disease, stroke and respiratory failure. The options available for help with cessation ,the behavioral changes to assist the process, and the option to either gradully reduce usage  Or abruptly stop.is also discussed. Pt is also encouraged to set specific goals in number of cigarettes used daily, as well as to set a quit date.     Hyperlipidemia Hyperlipidemia:Low fat diet discussed and encouraged.   Lipid Panel  Lab Results  Component Value Date   CHOL 116 (L) 09/27/2016   HDL 34 (L) 09/27/2016   LDLCALC 48 09/27/2016   TRIG 168 (H) 09/27/2016   CHOLHDL 3.4  09/27/2016   Not at goal    Hypertension Controlled, no change in medication DASH diet and commitment to daily physical activity for a minimum of 30 minutes discussed and encouraged, as a part of hypertension management. The importance of attaining a healthy weight is also discussed.  BP/Weight 04/17/2017 04/13/2017 02/06/2017 02/05/2017 01/06/2017 12/28/2016 23/53/6144  Systolic BP 315 400 867 - 619 509 -  Diastolic BP 82 69 83 - 89 74 -  Wt.  (Lbs) 235 234.1 221.78 - 220 227 235  BMI 31 30.89 - 29.26 29.03 29.95 31  Some encounter information is confidential and restricted. Go to Review Flowsheets activity to see all data.       COPD (chronic obstructive pulmonary disease) (HCC) Deteriorating due to ongoing nicotine use  Diabetes mellitus type 2 in obese (HCC) Controlled, no change in medication Matthew Molina is reminded of the importance of commitment to daily physical activity for 30 minutes or more, as able and the need to limit carbohydrate intake to 30 to 60 grams per meal to help with blood sugar control.   The need to take medication as prescribed, test blood sugar as directed, and to call between visits if there is a concern that blood sugar is uncontrolled is also discussed.   Matthew Molina is reminded of the importance of daily foot exam, annual eye examination, and good blood sugar, blood pressure and cholesterol control.  Diabetic Labs Latest Ref Rng & Units 04/13/2017 04/12/2017 04/11/2017 04/11/2017 02/06/2017  HbA1c 4.8 - 5.6 % - - 6.7(H) - -  Microalbumin Not estab mg/dL - - - - -  Micro/Creat Ratio <30 mcg/mg creat - - - - -  Chol 125 - 200 mg/dL - - - - -  HDL >=40 mg/dL - - - - -  Calc LDL <130 mg/dL - - - - -  Triglycerides <150 mg/dL - - - - -  Creatinine 0.61 - 1.24 mg/dL 1.53(H) 1.54(H) 1.76(H) 1.48(H) 1.45(H)   BP/Weight 04/17/2017 04/13/2017 02/06/2017 02/05/2017 01/06/2017 12/28/2016 32/67/1245  Systolic BP 809 983 382 - 505 397 -  Diastolic BP 82 69 83 - 89 74 -  Wt. (Lbs) 235 234.1 221.78 - 220 227 235  BMI 31 30.89 - 29.26 29.03 29.95 31  Some encounter information is confidential and restricted. Go to Review Flowsheets activity to see all data.   Foot/eye exam completion dates Latest Ref Rng & Units 07/10/2016 08/30/2015  Eye Exam No Retinopathy - -  Foot Form Completion - Done Done

## 2017-04-18 NOTE — Assessment & Plan Note (Signed)
Deteriorating due to ongoing nicotine use ?

## 2017-04-18 NOTE — Assessment & Plan Note (Signed)
Controlled, no change in medication Matthew Molina is reminded of the importance of commitment to daily physical activity for 30 minutes or more, as able and the need to limit carbohydrate intake to 30 to 60 grams per meal to help with blood sugar control.   The need to take medication as prescribed, test blood sugar as directed, and to call between visits if there is a concern that blood sugar is uncontrolled is also discussed.   Matthew Molina is reminded of the importance of daily foot exam, annual eye examination, and good blood sugar, blood pressure and cholesterol control.  Diabetic Labs Latest Ref Rng & Units 04/13/2017 04/12/2017 04/11/2017 04/11/2017 02/06/2017  HbA1c 4.8 - 5.6 % - - 6.7(H) - -  Microalbumin Not estab mg/dL - - - - -  Micro/Creat Ratio <30 mcg/mg creat - - - - -  Chol 125 - 200 mg/dL - - - - -  HDL >=40 mg/dL - - - - -  Calc LDL <130 mg/dL - - - - -  Triglycerides <150 mg/dL - - - - -  Creatinine 0.61 - 1.24 mg/dL 1.53(H) 1.54(H) 1.76(H) 1.48(H) 1.45(H)   BP/Weight 04/17/2017 04/13/2017 02/06/2017 02/05/2017 01/06/2017 12/28/2016 58/52/7782  Systolic BP 423 536 144 - 315 400 -  Diastolic BP 82 69 83 - 89 74 -  Wt. (Lbs) 235 234.1 221.78 - 220 227 235  BMI 31 30.89 - 29.26 29.03 29.95 31  Some encounter information is confidential and restricted. Go to Review Flowsheets activity to see all data.   Foot/eye exam completion dates Latest Ref Rng & Units 07/10/2016 08/30/2015  Eye Exam No Retinopathy - -  Foot Form Completion - Done Done

## 2017-04-18 NOTE — Assessment & Plan Note (Signed)
Improved and clinically stable. NEEDS to stop smoking to reduce disease burden and improve health, he hears and understands this and is willing to work on this Spiriva renewed , no changes in chronic medications. In  Home PT twice weekly for 4 weeks for improved gait and reduced fall risk to be arranged

## 2017-04-18 NOTE — Assessment & Plan Note (Signed)
Reports unsteady gait and near falls, also recent PT evaluation in hospital recommends need for PT to improve gait and reduce fall risk referred for 4 weeks of bi weekly sessions

## 2017-04-20 ENCOUNTER — Other Ambulatory Visit: Payer: Self-pay | Admitting: Family Medicine

## 2017-05-01 DIAGNOSIS — J449 Chronic obstructive pulmonary disease, unspecified: Secondary | ICD-10-CM | POA: Diagnosis not present

## 2017-05-02 ENCOUNTER — Telehealth: Payer: Self-pay | Admitting: Family Medicine

## 2017-05-02 ENCOUNTER — Other Ambulatory Visit: Payer: Self-pay | Admitting: Family Medicine

## 2017-05-02 ENCOUNTER — Ambulatory Visit: Payer: Medicare Other | Admitting: Podiatry

## 2017-05-02 ENCOUNTER — Telehealth: Payer: Self-pay

## 2017-05-02 MED ORDER — PREDNISONE 5 MG (21) PO TBPK: 5.0000 mg | ORAL_TABLET | ORAL | 0 refills | Status: DC

## 2017-05-02 NOTE — Telephone Encounter (Signed)
Pt requesting help with equipment  supply from [employer] , I did explain that you are not in tomorrow, and that we hd a nurse covering so that  There is no guarantee this will be addressed before the following day, I encouraged him to contimue to call the company himself also

## 2017-05-02 NOTE — Telephone Encounter (Signed)
Patient is requesting cough medication  Matthew Molina  Cb# [phone]

## 2017-05-02 NOTE — Telephone Encounter (Signed)
3 day h/o I ncreased cough, no fever , no chills, no sputum, will send in prednisone dose pack to [employer], pt aware

## 2017-05-02 NOTE — Telephone Encounter (Signed)
Matthew Molina has called x 2 asking for something to be called in for him as he has a cold.

## 2017-05-03 ENCOUNTER — Other Ambulatory Visit: Payer: Self-pay | Admitting: Family Medicine

## 2017-05-03 NOTE — Telephone Encounter (Signed)
This was addressed 1 day ago and is closed

## 2017-05-04 ENCOUNTER — Other Ambulatory Visit: Payer: Self-pay | Admitting: Family Medicine

## 2017-05-04 NOTE — Telephone Encounter (Signed)
Spoke with someone but they placed me on hold and nobody ever answered so I faxed a request with patient information to have an oxygen check scheduled

## 2017-05-04 NOTE — Telephone Encounter (Signed)
Pt seen in the office

## 2017-05-08 ENCOUNTER — Other Ambulatory Visit: Payer: Self-pay | Admitting: *Deleted

## 2017-05-08 ENCOUNTER — Other Ambulatory Visit: Payer: Self-pay

## 2017-05-08 MED ORDER — GLIPIZIDE ER 5 MG PO TB24: 5.0000 mg | ORAL_TABLET | Freq: Every day | ORAL | 1 refills | Status: DC

## 2017-05-08 NOTE — Patient Outreach (Signed)
[facility]) Care Management  05/08/2017  Matthew Molina February 19, 1951 [id number]  Nurse Call Center referral (patient wants to know if he can use stop smoking lozenges along with the daily patch to assist with smoking cessation):  Telephone call to patient who was advised of reason for follow up call.  Patient states she took the advise of Nurse Call Center & did not start the lozenges. States he had not called pharmacist but will call at a later date. Currently only using nicotine patch daily. States he has not smoked in 6 days. Patient advised that he was up to smoking 1 1/2 packs daily.  Advised that he had not had any flair ups of COPD and continues to use oxygen therapy at 3 liters at night.   States getting medications delivered to his home from [place] . States he is taking medications as prescribed by his MD.   Patient has no further questions.  Referred patient to MD or pharmacist if further questions regarding over the counter medications. Patient voices understanding.   Plan: Will close out case/send to care management assistant.  Sherrin Daisy, RN BSN [place] Management Coordinator [facility] Care Management  [phone]

## 2017-05-09 ENCOUNTER — Ambulatory Visit (INDEPENDENT_AMBULATORY_CARE_PROVIDER_SITE_OTHER): Payer: Medicare Other

## 2017-05-09 VITALS — BP 118/80 | HR 84 | Ht 73.0 in | Wt 234.1 lb

## 2017-05-09 DIAGNOSIS — Z0000 Encounter for general adult medical examination without abnormal findings: Secondary | ICD-10-CM | POA: Diagnosis not present

## 2017-05-09 NOTE — Progress Notes (Signed)
Subjective:   Matthew Molina is a 66 y.o. male who presents for Medicare Annual/Subsequent preventive examination.  Review of Systems:  Cardiac Risk Factors include: diabetes mellitus;dyslipidemia;hypertension;male gender;obesity (BMI >30kg/m2);sedentary lifestyle;smoking/ tobacco exposure;advanced age (>95men, >2 women)     Objective:    Vitals: BP 118/80   Pulse 84   Ht 6\' 1"  (1.854 m)   Wt 234 lb 1.3 oz (106.2 kg)   SpO2 94%   BMI 30.88 kg/m   Body mass index is 30.88 kg/m.  Tobacco History  Smoking Status  . Former Smoker  . Packs/day: 1.50  . Years: 48.00  . Types: Cigarettes  . Quit date: 05/02/2017  Smokeless Tobacco  . Never Used    Comment: quit a week ago     Counseling given: Not Answered   Past Medical History:  Diagnosis Date  . Acute kidney injury ([place])   . Acute respiratory failure with hypoxia ([place])   . Arthritis   . COPD (chronic obstructive pulmonary disease) ([place])   . Depression   . Diabetes mellitus   . Diabetes mellitus without complication ([place])   . Hypercholesterolemia   . Hyperlipidemia   . Hypertension   . Multiple lung nodules on CT 04/02/2015  . Nicotine addiction   . Obesity   . Oxygen deficiency    qhs  . Schizophrenia [facility])    Past Surgical History:  Procedure Laterality Date  . CATARACT EXTRACTION W/PHACO Left 11/20/2013   Procedure: CATARACT EXTRACTION PHACO AND INTRAOCULAR LENS PLACEMENT (IOC);  Surgeon: Tonny Branch, MD;  Location: [facility] ORS;  Service: Ophthalmology;  Laterality: Left;  CDE:10.26  . CATARACT EXTRACTION W/PHACO Right 12/08/2013   Procedure: RIGHT EYE CATARACT EXTRACTION PHACO AND INTRAOCULAR LENS PLACEMENT ;  Surgeon: Tonny Branch, MD;  Location: [facility] ORS;  Service: Ophthalmology;  Laterality: Right;  CDE 12.38  . COLONOSCOPY N/A 04/01/2013   Procedure: COLONOSCOPY;  Surgeon: Danie Binder, MD;  Location: [facility] ENDO SUITE;  Service: Endoscopy;  Laterality: N/A;  10:00 AM-moved to [place] notified pt  .  COLONOSCOPY  2014   INCOMPLETE PREP IN R COLON  . ESOPHAGOGASTRODUODENOSCOPY N/A 02/18/2016   Procedure: ESOPHAGOGASTRODUODENOSCOPY (EGD);  Surgeon: Danie Binder, MD;  Location: [facility] ENDO SUITE;  Service: Endoscopy;  Laterality: N/A;  1145  . EYE SURGERY Left 11/2013   cataract extraction  . SAVORY DILATION N/A 02/18/2016   Procedure: SAVORY DILATION;  Surgeon: Danie Binder, MD;  Location: [facility] END[facility] SUITE;  Service: Endoscopy;  Laterality: N/A;  . SHOULDER SURGERY     Family History  Problem Relation Age of Onset  . Diabetes Mother   . Hypertension Mother   . Stroke Mother   . Diabetes Sister   . Heart disease Sister   . Kidney disease Father   . Drug abuse Brother   . Colon cancer Neg Hx   . Colon polyps Neg Hx    History  Sexual Activity  . Sexual activity: Not Currently  . Birth control/ protection: None    Outpatient Encounter Prescriptions as of 05/09/2017  Medication Sig  . ACCU-CHEK SOFTCLIX LANCETS lancets TEST BLOOD SUGAR ONCE DAILY AS DIRECTED.  Marland Kitchen acetaminophen (TYLENOL) 500 MG tablet Take 500 mg by mouth daily as needed for moderate pain.   Marland Kitchen albuterol (PROVENTIL HFA;VENTOLIN HFA) 108 (90 BASE) MCG/ACT inhaler Inhale 2 puffs into the lungs every 6 (six) hours as needed for wheezing or shortness of breath.  Marland Kitchen aspirin EC 81 MG tablet Take 81 mg  by mouth daily.  . busPIRone (BUSPAR) 7.5 MG tablet Take 7.5 mg by mouth 3 (three) times daily.  Marland Kitchen dextromethorphan-guaiFENesin (MUCINEX DM) 30-600 MG 12hr tablet Take 1 tablet by mouth 2 (two) times daily. (Patient taking differently: Take 1 tablet by mouth 2 (two) times daily as needed. )  . docusate sodium (COLACE) 100 MG capsule Take 100 mg by mouth daily.  Marland Kitchen glipiZIDE (GLIPIZIDE XL) 5 MG 24 hr tablet Take 1 tablet (5 mg total) by mouth daily with breakfast.  . glucose blood (ACCU-CHEK AVIVA PLUS) test strip Once daily testing E11.9, E66.9  . ipratropium-albuterol (DUONEB) 0.5-2.5 (3) MG/3ML SOLN INHALE 1 VIAL VIA NEBULIZER EVERY  FOUR HOURS AS NEEDED.  Marland Kitchen polyethylene glycol powder (GLYCOLAX/MIRALAX) powder MIX 1 CAPFUL (17G) IN 8 OUNCES OF JUICE/WATER AND DRINK ONCE DAILY. (Patient taking differently: MIX 1 CAPFUL (17G) IN 8 OUNCES OF JUICE/WATER AND DRINK ONCE DAILY AS NEEDED)  . pravastatin (PRAVACHOL) 20 MG tablet TAKE (1) TABLET BY MOUTH AT BEDTIME FOR CHOLESTEROL.  Marland Kitchen risperiDONE (RISPERDAL) 1 MG tablet TAKE (1) TABLET BY MOUTH AT BEDTIME WITH 4MG .  . risperidone (RISPERDAL) 4 MG tablet TAKE (1) TABLET BY MOUTH AT BEDTIME.  . SYMBICORT 80-4.5 MCG/ACT inhaler INHALE 2 PUFFS INTO LUNGS TWICE DAILY.  Marland Kitchen tiotropium (SPIRIVA) 18 MCG inhalation capsule Place 1 capsule (18 mcg total) into inhaler and inhale daily.  . traZODone (DESYREL) 50 MG tablet TAKE ONE TABLET BY MOUTH AT BEDTIME AS NEEDED FOR SLEEP.  Marland Kitchen triamterene-hydrochlorothiazide (MAXZIDE-25) 37.5-25 MG tablet Take 0.5 tablets by mouth daily.  . valsartan (DIOVAN) 320 MG tablet TAKE ONE TABLET BY MOUTH DAILY.  . [DISCONTINUED] benzonatate (TESSALON) 100 MG capsule Take 1 capsule (100 mg total) by mouth 3 (three) times daily as needed for cough.  . [DISCONTINUED] omeprazole (PRILOSEC) 20 MG capsule TAKE 1 CAPSULE BY MOUTH 30 MINUTES PRIOR TO BREAKFAST AND SUPPER.  . [DISCONTINUED] predniSONE (STERAPRED UNI-PAK 21 TAB) 5 MG (21) TBPK tablet Take 1 tablet (5 mg total) by mouth as directed. Use as directed  . [DISCONTINUED] senna-docusate (SENOKOT-S) 8.6-50 MG tablet Take 1 tablet by mouth at bedtime as needed for mild constipation.   No facility-administered encounter medications on file as of 05/09/2017.     Activities of Daily Living In your present state of health, do you have any difficulty performing the following activities: 05/09/2017 04/11/2017  Hearing? Y N  Vision? N N  Difficulty concentrating or making decisions? Y N  Walking or climbing stairs? Y Y  Dressing or bathing? Y Y  Doing errands, shopping? N Y  Conservation officer, nature and eating ? Y -  Using the  Toilet? N -  In the past six months, have you accidently leaked urine? N -  Do you have problems with loss of bowel control? N -  Managing your Medications? N -  Managing your Finances? N -  Housekeeping or managing your Housekeeping? Y -  Some recent data might be hidden    Patient Care Team: Fayrene Helper, MD as PCP - General (Family Medicine) Fay Records, MD as Consulting Physician (Cardiology) Fayrene Helper, MD Fayrene Helper, MD (Family Medicine) Danie Binder, MD as Consulting Physician (Gastroenterology) Gardiner Barefoot, DPM as Consulting Physician (Podiatry) Madelin Headings, DO (Optometry)   Assessment:    Exercise Activities and Dietary recommendations Current Exercise Habits: The patient does not participate in regular exercise at present, Exercise limited by: respiratory conditions(s) (COPD patient becomes SOB)  Goals    .  Exercise 3x per week (30 min per time)          Recommend starting a routine exercise program at least 3 days a week for 30-45 minutes at a time as tolerated.        Fall Risk Fall Risk  05/09/2017 04/17/2017 06/14/2016 05/09/2016 05/04/2016  Falls in the past year? No No No No No  Risk for fall due to : Impaired balance/gait - Impaired balance/gait - -  Risk for fall due to (comments): - - - - -   Depression Screen PHQ 2/9 Scores 05/09/2017 04/17/2017 06/14/2016 04/11/2016  PHQ - 2 Score 0 0 - 1  PHQ- 9 Score - - - -  Exception Documentation - - Medical reason -    Cognitive Function: Normal   6CIT Screen 05/09/2017  What Year? 0 points  What month? 0 points  What time? 0 points  Count back from 20 0 points  Months in reverse 0 points  Repeat phrase 0 points  Total Score 0    Immunization History  Administered Date(s) Administered  . Influenza Split 09/02/2012  . Influenza Whole 09/19/2007, 09/23/2009, 10/07/2010, 08/28/2011, 09/02/2015  . Influenza,inj,Quad PF,36+ Mos 09/02/2013, 11/30/2014, 08/30/2015, 09/27/2016  .  Pneumococcal Conjugate-13 06/16/2014  . Pneumococcal Polysaccharide-23 06/28/2004, 08/27/2009, 04/17/2011, 05/04/2016  . Td 06/28/2004   Screening Tests Health Maintenance  Topic Date Due  . OPHTHALMOLOGY EXAM  01/28/2016  . FOOT EXAM  07/10/2017  . INFLUENZA VACCINE  07/18/2017  . HEMOGLOBIN A1C  10/11/2017  . COLONOSCOPY  04/02/2023  . TETANUS/TDAP  02/17/2027  . Hepatitis C Screening  Completed  . PNA vac Low Risk Adult  Completed      Plan:   I have personally reviewed and noted the following in the patient's chart:   . Medical and social history . Use of alcohol, tobacco or illicit drugs  . Current medications and supplements . Functional ability and status . Nutritional status . Physical activity . Advanced directives . List of other physicians . Hospitalizations, surgeries, and ER visits in previous 12 months . Vitals . Screenings to include cognitive, depression, and falls . Referrals and appointments: [employer] referral sent today.  In addition, I have reviewed and discussed with patient certain preventive protocols, quality metrics, and best practice recommendations. A written personalized care plan for preventive services as well as general preventive health recommendations were provided to patient.     Stormy Fabian, LPN  09/11/4627

## 2017-05-09 NOTE — Patient Instructions (Addendum)
[name] , Thank you for taking time to come for your Medicare Wellness Visit. I appreciate your ongoing commitment to your health goals. Please review the following plan we discussed and let me know if I can assist you in the future.   Screening recommendations/referrals: Community resource referral sent in today to help you find assistance with dental care and transportation. You should receive a call from our care guide, [name] within the next month. Colonoscopy: Up to date, next due 03/2023 Diabetic Eye Exam: Due, is scheduled this Friday at 1:30 pm Recommended yearly dental visit for hygiene and checkup  Vaccinations: Influenza vaccine: Up to date, next due 07/2017 Pneumococcal vaccine: Up to date Tdap vaccine: Up to date, next due 02/2027 Shingles vaccine: Done    Advanced directives: Advance directive discussed with you today. I have provided a copy for you to complete at home and have notarized. Once this is complete please bring a copy in to our office so we can scan it into your chart.  Conditions/risks identified: Obese, recommend starting a routine exercise program at least 3 days a week for 30-45 minutes at a time as tolerated.   Next appointment: Follow up with [name] on 08/01/2017 at 11:00 am. Follow up in 1 year for your annual wellness visit.  Preventive Care 8 Years and Older, Male Preventive care refers to lifestyle choices and visits with your health care provider that can promote health and wellness. What does preventive care include?  A yearly physical exam. This is also called an annual well check.  Dental exams once or twice a year.  Routine eye exams. Ask your health care provider how often you should have your eyes checked.  Personal lifestyle choices, including:  Daily care of your teeth and gums.  Regular physical activity.  Eating a healthy diet.  Avoiding tobacco and drug use.  Limiting alcohol use.  Practicing safe sex.  Taking low doses  of aspirin every day.  Taking vitamin and mineral supplements as recommended by your health care provider. What happens during an annual well check? The services and screenings done by your health care provider during your annual well check will depend on your age, overall health, lifestyle risk factors, and family history of disease. Counseling  Your health care provider may ask you questions about your:  Alcohol use.  Tobacco use.  Drug use.  Emotional well-being.  Home and relationship well-being.  Sexual activity.  Eating habits.  History of falls.  Memory and ability to understand (cognition).  Work and work Statistician. Screening  You may have the following tests or measurements:  Height, weight, and BMI.  Blood pressure.  Lipid and cholesterol levels. These may be checked every 5 years, or more frequently if you are over 53 years old.  Skin check.  Lung cancer screening. You may have this screening every year starting at age 2 if you have a 30-pack-year history of smoking and currently smoke or have quit within the past 15 years.  Fecal occult blood test (FOBT) of the stool. You may have this test every year starting at age 52.  Flexible sigmoidoscopy or colonoscopy. You may have a sigmoidoscopy every 5 years or a colonoscopy every 10 years starting at age 20.  Prostate cancer screening. Recommendations will vary depending on your family history and other risks.  Hepatitis C blood test.  Hepatitis B blood test.  Sexually transmitted disease (STD) testing.  Diabetes screening. This is done by checking your blood sugar (  glucose) after you have not eaten for a while (fasting). You may have this done every 1-3 years.  Abdominal aortic aneurysm (AAA) screening. You may need this if you are a current or former smoker.  Osteoporosis. You may be screened starting at age 82 if you are at high risk. Talk with your health care provider about your test results,  treatment options, and if necessary, the need for more tests. Vaccines  Your health care provider may recommend certain vaccines, such as:  Influenza vaccine. This is recommended every year.  Tetanus, diphtheria, and acellular pertussis (Tdap, Td) vaccine. You may need a Td booster every 10 years.  Zoster vaccine. You may need this after age 65.  Pneumococcal 13-valent conjugate (PCV13) vaccine. One dose is recommended after age 32.  Pneumococcal polysaccharide (PPSV23) vaccine. One dose is recommended after age 19. Talk to your health care provider about which screenings and vaccines you need and how often you need them. This information is not intended to replace advice given to you by your health care provider. Make sure you discuss any questions you have with your health care provider. Document Released: 12/31/2015 Document Revised: 08/23/2016 Document Reviewed: 10/05/2015 Elsevier Interactive Patient Education  2017 [place] Prevention in the Home Falls can cause injuries. They can happen to people of all ages. There are many things you can do to make your home safe and to help prevent falls. What can I do on the outside of my home?  Regularly fix the edges of walkways and driveways and fix any cracks.  Remove anything that might make you trip as you walk through a door, such as a raised step or threshold.  Trim any bushes or trees on the path to your home.  Use bright outdoor lighting.  Clear any walking paths of anything that might make someone trip, such as rocks or tools.  Regularly check to see if handrails are loose or broken. Make sure that both sides of any steps have handrails.  Any raised decks and porches should have guardrails on the edges.  Have any leaves, snow, or ice cleared regularly.  Use sand or salt on walking paths during winter.  Clean up any spills in your garage right away. This includes oil or grease spills. What can I do in the  bathroom?  Use night lights.  Install grab bars by the toilet and in the tub and shower. Do not use towel bars as grab bars.  Use non-skid mats or decals in the tub or shower.  If you need to sit down in the shower, use a plastic, non-slip stool.  Keep the floor dry. Clean up any water that spills on the floor as soon as it happens.  Remove soap buildup in the tub or shower regularly.  Attach bath mats securely with double-sided non-slip rug tape.  Do not have throw rugs and other things on the floor that can make you trip. What can I do in the bedroom?  Use night lights.  Make sure that you have a light by your bed that is easy to reach.  Do not use any sheets or blankets that are too big for your bed. They should not hang down onto the floor.  Have a firm chair that has side arms. You can use this for support while you get dressed.  Do not have throw rugs and other things on the floor that can make you trip. What can I do in the kitchen?  Clean up any spills right away.  Avoid walking on wet floors.  Keep items that you use a lot in easy-to-reach places.  If you need to reach something above you, use a strong step stool that has a grab bar.  Keep electrical cords out of the way.  Do not use floor polish or wax that makes floors slippery. If you must use wax, use non-skid floor wax.  Do not have throw rugs and other things on the floor that can make you trip. What can I do with my stairs?  Do not leave any items on the stairs.  Make sure that there are handrails on both sides of the stairs and use them. Fix handrails that are broken or loose. Make sure that handrails are as long as the stairways.  Check any carpeting to make sure that it is firmly attached to the stairs. Fix any carpet that is loose or worn.  Avoid having throw rugs at the top or bottom of the stairs. If you do have throw rugs, attach them to the floor with carpet tape.  Make sure that you have a  light switch at the top of the stairs and the bottom of the stairs. If you do not have them, ask someone to add them for you. What else can I do to help prevent falls?  Wear shoes that:  Do not have high heels.  Have rubber bottoms.  Are comfortable and fit you well.  Are closed at the toe. Do not wear sandals.  If you use a stepladder:  Make sure that it is fully opened. Do not climb a closed stepladder.  Make sure that both sides of the stepladder are locked into place.  Ask someone to hold it for you, if possible.  Clearly mark and make sure that you can see:  Any grab bars or handrails.  First and last steps.  Where the edge of each step is.  Use tools that help you move around (mobility aids) if they are needed. These include:  Canes.  Walkers.  Scooters.  Crutches.  Turn on the lights when you go into a dark area. Replace any light bulbs as soon as they burn out.  Set up your furniture so you have a clear path. Avoid moving your furniture around.  If any of your floors are uneven, fix them.  If there are any pets around you, be aware of where they are.  Review your medicines with your doctor. Some medicines can make you feel dizzy. This can increase your chance of falling. Ask your doctor what other things that you can do to help prevent falls. This information is not intended to replace advice given to you by your health care provider. Make sure you discuss any questions you have with your health care provider. Document Released: 09/30/2009 Document Revised: 05/11/2016 Document Reviewed: 01/08/2015 Elsevier Interactive Patient Education  2017 [employer].

## 2017-05-11 LAB — HM DIABETES EYE EXAM

## 2017-05-18 ENCOUNTER — Telehealth: Payer: Self-pay | Admitting: Family Medicine

## 2017-05-18 NOTE — Telephone Encounter (Signed)
April from [facility] diabetic program called requesting patients last A1C with date and BP with date  Confidential voicemail : [phone] extension [phone] Fax: [phone]

## 2017-05-21 ENCOUNTER — Other Ambulatory Visit: Payer: Self-pay | Admitting: Family Medicine

## 2017-05-23 NOTE — Telephone Encounter (Signed)
That was faxed over

## 2017-06-01 ENCOUNTER — Ambulatory Visit (INDEPENDENT_AMBULATORY_CARE_PROVIDER_SITE_OTHER): Payer: Medicare Other | Admitting: Podiatry

## 2017-06-01 ENCOUNTER — Encounter: Payer: Self-pay | Admitting: Podiatry

## 2017-06-01 DIAGNOSIS — M79676 Pain in unspecified toe(s): Secondary | ICD-10-CM

## 2017-06-01 DIAGNOSIS — J449 Chronic obstructive pulmonary disease, unspecified: Secondary | ICD-10-CM | POA: Diagnosis not present

## 2017-06-01 DIAGNOSIS — Q828 Other specified congenital malformations of skin: Secondary | ICD-10-CM

## 2017-06-01 DIAGNOSIS — E1149 Type 2 diabetes mellitus with other diabetic neurological complication: Secondary | ICD-10-CM

## 2017-06-01 DIAGNOSIS — B351 Tinea unguium: Secondary | ICD-10-CM | POA: Diagnosis not present

## 2017-06-01 DIAGNOSIS — E1140 Type 2 diabetes mellitus with diabetic neuropathy, unspecified: Secondary | ICD-10-CM

## 2017-06-01 NOTE — Progress Notes (Signed)
Patient ID: [name], male   DOB: 09/02/1951, 66 y.o.   MRN: [id number] Complaint:  Visit Type: Patient returns to my office for  preventative foot care services. Complaint: Patient states" my nails have grown long and thick and become painful to walk and wear shoes" Patient has been diagnosed with DM with no foot complications. The patient presents for preventative foot care services. No changes to ROS He relates two painful callus under both feet.    Podiatric Exam: Vascular: dorsalis pedis and posterior tibial pulses are palpable bilateral. Capillary return is immediate. Temperature gradient is WNL. Skin turgor WNL  Sensorium: Normal Semmes Weinstein monofilament test. Normal tactile sensation bilaterally. Nail Exam: Pt has thick disfigured discolored nails with subungual debris noted bilateral entire nail hallux through fifth toenails Ulcer Exam: There is no evidence of ulcer or pre-ulcerative changes or infection. Orthopedic Exam: Muscle tone and strength are WNL. No limitations in general ROM. No crepitus or effusions noted. Foot type and digits show no abnormalities. Bony prominences are unremarkable.Mild brownish discoloration 1st MPJ right foot. Skin:  Porokeratosis sub 5th metabase both feet.. No infection or ulcers  Diagnosis:  Onychomycosis, , Pain in right toe, pain in left toes, Porokeratosis  Treatment & Plan Procedures and Treatment: Consent by patient was obtained for treatment procedures. The patient understood the discussion of treatment and procedures well. All questions were answered thoroughly reviewed. Debridement of mycotic and hypertrophic toenails, 1 through 5 bilateral and clearing of subungual debris. No ulceration, no infection noted. Debride porokeratosis. Return Visit-Office Procedure: Patient instructed to return to the office for a follow up visit 3 months for continued evaluation and treatment.    [name] DPM 

## 2017-06-25 ENCOUNTER — Other Ambulatory Visit: Payer: Self-pay | Admitting: Family Medicine

## 2017-07-01 DIAGNOSIS — J449 Chronic obstructive pulmonary disease, unspecified: Secondary | ICD-10-CM | POA: Diagnosis not present

## 2017-07-02 ENCOUNTER — Telehealth: Payer: Self-pay | Admitting: Family Medicine

## 2017-07-02 NOTE — Telephone Encounter (Signed)
Patient informed of message below, verbalized understanding.  

## 2017-07-02 NOTE — Telephone Encounter (Signed)
No apptd available today I office so I suggest urgent care or EdD for eval and management

## 2017-07-02 NOTE — Telephone Encounter (Signed)
New Message  Pt voiced his right foot is swollen and cannot walk on it too good.  Pt voiced it all of a sudden happened, he was laying down and when he got up it started hurting.  Pt voiced since yesterday.  Please f/u

## 2017-07-03 ENCOUNTER — Emergency Department (HOSPITAL_COMMUNITY): Payer: Medicare Other

## 2017-07-03 ENCOUNTER — Emergency Department (HOSPITAL_COMMUNITY)
Admission: EM | Admit: 2017-07-03 | Discharge: 2017-07-03 | Disposition: A | Discharge status: Home / Self Care | Payer: Medicare Other | Attending: Emergency Medicine | Admitting: Emergency Medicine

## 2017-07-03 ENCOUNTER — Encounter (HOSPITAL_COMMUNITY): Payer: Self-pay

## 2017-07-03 DIAGNOSIS — N183 Chronic kidney disease, stage 3 (moderate): Secondary | ICD-10-CM | POA: Diagnosis not present

## 2017-07-03 DIAGNOSIS — I129 Hypertensive chronic kidney disease with stage 1 through stage 4 chronic kidney disease, or unspecified chronic kidney disease: Secondary | ICD-10-CM | POA: Insufficient documentation

## 2017-07-03 DIAGNOSIS — I2510 Atherosclerotic heart disease of native coronary artery without angina pectoris: Secondary | ICD-10-CM | POA: Insufficient documentation

## 2017-07-03 DIAGNOSIS — L03115 Cellulitis of right lower limb: Secondary | ICD-10-CM | POA: Diagnosis not present

## 2017-07-03 DIAGNOSIS — F17210 Nicotine dependence, cigarettes, uncomplicated: Secondary | ICD-10-CM | POA: Diagnosis not present

## 2017-07-03 DIAGNOSIS — J449 Chronic obstructive pulmonary disease, unspecified: Secondary | ICD-10-CM | POA: Insufficient documentation

## 2017-07-03 DIAGNOSIS — M79671 Pain in right foot: Secondary | ICD-10-CM

## 2017-07-03 DIAGNOSIS — M19071 Primary osteoarthritis, right ankle and foot: Secondary | ICD-10-CM | POA: Diagnosis not present

## 2017-07-03 LAB — BASIC METABOLIC PANEL
Anion gap: 9 (ref 5–15)
BUN: 12 mg/dL (ref 6–20)
CO2: 26 mmol/L (ref 22–32)
Calcium: 10 mg/dL (ref 8.9–10.3)
Chloride: 98 mmol/L — ABNORMAL LOW (ref 101–111)
Creatinine, Ser: 1.59 mg/dL — ABNORMAL HIGH (ref 0.61–1.24)
GFR calc Af Amer: 50 mL/min — ABNORMAL LOW (ref >60)
GFR calc non Af Amer: 43 mL/min — ABNORMAL LOW (ref >60)
Glucose, Bld: 146 mg/dL — ABNORMAL HIGH (ref 65–99)
Potassium: 3.7 mmol/L (ref 3.5–5.1)
Sodium: 133 mmol/L — ABNORMAL LOW (ref 135–145)

## 2017-07-03 LAB — CBC WITH DIFFERENTIAL/PLATELET
Basophils Absolute: 0 10*3/uL (ref 0.0–0.1)
Basophils Relative: 0 %
Eosinophils Absolute: 0.4 10*3/uL (ref 0.0–0.7)
Eosinophils Relative: 6 %
HCT: 42.1 % (ref 39.0–52.0)
Hemoglobin: 13.8 g/dL (ref 13.0–17.0)
Lymphocytes Relative: 26 %
Lymphs Abs: 1.8 10*3/uL (ref 0.7–4.0)
MCH: 29.1 pg (ref 26.0–34.0)
MCHC: 32.8 g/dL (ref 30.0–36.0)
MCV: 88.8 fL (ref 78.0–100.0)
Monocytes Absolute: 0.8 10*3/uL (ref 0.1–1.0)
Monocytes Relative: 12 %
Neutro Abs: 4 10*3/uL (ref 1.7–7.7)
Neutrophils Relative %: 56 %
Platelets: 200 10*3/uL (ref 150–400)
RBC: 4.74 MIL/uL (ref 4.22–5.81)
RDW: 14 % (ref 11.5–15.5)
WBC: 7.1 10*3/uL (ref 4.0–10.5)

## 2017-07-03 MED ORDER — TRAMADOL HCL 50 MG PO TABS
50.0000 mg | ORAL_TABLET | Freq: Once | ORAL | Status: AC
  Administered 2017-07-03: 50 mg via ORAL
  Filled 2017-07-03: qty 1

## 2017-07-03 MED ORDER — DOXYCYCLINE HYCLATE 100 MG PO CAPS: 100.0000 mg | ORAL_CAPSULE | Freq: Two times a day (BID) | ORAL | 0 refills | Status: AC

## 2017-07-03 MED ORDER — TRAMADOL HCL 50 MG PO TABS: 50.0000 mg | ORAL_TABLET | Freq: Four times a day (QID) | ORAL | 0 refills | Status: DC | PRN

## 2017-07-03 NOTE — ED Triage Notes (Signed)
Pt reports waking this morning with pain in the right foot, dorsal aspect. Pt denies injury

## 2017-07-03 NOTE — Discharge Instructions (Signed)
You have been seen today in the Emergency Department (ED) for cellulitis, a superficial skin infection. Please take your antibiotics as prescribed for their ENTIRE prescribed duration.  Take Tylenol as needed for pain, but only as written on the box.  ° °Please follow up with your doctor or in the ED in 24-48 hours for recheck of your infection if you are not improving.  Call your doctor sooner or return to the ED if you develop worsening signs of infection such as: increased redness, increased pain, pus, fever, or other symptoms that concern you. ° °

## 2017-07-03 NOTE — ED Provider Notes (Signed)
Emergency Department Provider Note   I have reviewed the triage vital signs and the nursing notes.   HISTORY  Chief Complaint Foot Pain   HPI Matthew Molina is a 66 y.o. male with PMH of COPD, DM, HLD, HTN, and tobacco use presents to the emergency department for evaluation of right foot pain, redness, and swelling. Patient's symptoms began yesterday and it worsened gradually over the last 24 hours. He describes an area of redness that is worse with movement or touch. No ulcerations. No drainage. No fevers or chills. He has difficulty walking on the foot because of pain. He denies any pain in the knee but felt like he had some discomfort radiating up into his lower leg yesterday. No chest pain or difficulty breathing. No history of foot ulcers or cellulitis. He reports a history of gout but that was in his big toe. No obvious injury to the foot. Pain is severe.    Past Medical History:  Diagnosis Date  . Acute kidney injury ([place])   . Acute respiratory failure with hypoxia ([place])   . Arthritis   . COPD (chronic obstructive pulmonary disease) ([place])   . Depression   . Diabetes mellitus   . Diabetes mellitus without complication ([place])   . Hypercholesterolemia   . Hyperlipidemia   . Hypertension   . Multiple lung nodules on CT 04/02/2015  . Nicotine addiction   . Obesity   . Oxygen deficiency    qhs  . Schizophrenia [facility])     Patient Active Problem List   Diagnosis Date Noted  . Unsteady gait 04/18/2017  . Acute exacerbation of chronic obstructive pulmonary disease (COPD) ([place]) 04/11/2017  . Elevated troponin I level 04/11/2017  . COPD exacerbation ([place]) 02/05/2017  . Hypokalemia 02/05/2017  . ARF (acute renal failure) ([place]) 02/05/2017  . Elevated PSA 10/01/2016  . External hemorrhoid, thrombosed 08/24/2016  . GERD (gastroesophageal reflux disease) 08/24/2016  . CKD (chronic kidney disease) stage 3, GFR 30-59 ml/min 05/22/2016  . Hospital discharge follow-up 02/08/2016    . Dysphagia 01/05/2016  . Insomnia 01/05/2016  . COPD (chronic obstructive pulmonary disease) ([place]) 11/05/2015  . Hypertension 11/05/2015  . Acute on chronic respiratory failure with hypoxia ([place]) 07/24/2015  . Tobacco abuse 07/24/2015  . Hyponatremia 07/24/2015  . Coronary atherosclerosis of native coronary artery 04/02/2015  . Multiple lung nodules on CT 04/02/2015  . GAD (generalized anxiety disorder) 07/04/2012  . Back pain with radiation 11/01/2011  . Diabetes mellitus type 2 in obese ([place]) 04/17/2011  . Hyperlipidemia 06/02/2008  . Obesity (BMI 30.0-34.9) 06/02/2008  . Schizophrenia, unspecified type ([place]) 06/02/2008    Past Surgical History:  Procedure Laterality Date  . CATARACT EXTRACTION W/PHACO Left 11/20/2013   Procedure: CATARACT EXTRACTION PHACO AND INTRAOCULAR LENS PLACEMENT (IOC);  Surgeon: Tonny Branch, MD;  Location: [facility];  Service: Ophthalmology;  Laterality: Left;  CDE:10.26  . CATARACT EXTRACTION W/PHACO Right 12/08/2013   Procedure: RIGHT EYE CATARACT EXTRACTION PHACO AND INTRAOCULAR LENS PLACEMENT ;  Surgeon: Tonny Branch, MD;  Location: [facility];  Service: Ophthalmology;  Laterality: Right;  CDE 12.38  . COLONOSCOPY N/A 04/01/2013   Procedure: COLONOSCOPY;  Surgeon: Danie Binder, MD;  Location: [facility] END[facility] SUITE;  Service: Endoscopy;  Laterality: N/A;  10:00 AM-moved to [place] notified pt  . COLONOSCOPY  2014   INCOMPLETE PREP IN R COLON  . ESOPHAGOGASTRODUODENOSCOPY N/A 02/18/2016   Procedure: ESOPHAGOGASTRODUODENOSCOPY (EGD);  Surgeon: Danie Binder, MD;  Location: [facility];  Service: Endoscopy;  Laterality: N/A;  1145  . EYE SURGERY Left 11/2013   cataract extraction  . SAVORY DILATION N/A 02/18/2016   Procedure: SAVORY DILATION;  Surgeon: Danie Binder, MD;  Location: [facility];  Service: Endoscopy;  Laterality: N/A;  . SHOULDER SURGERY      Current Outpatient Rx  . Order #: [id number] Class: Normal  . Order #: [id number] Class: Normal  . Order  #: [id number] Class: Historical Med  . Order #: [id number] Class: Print  . Order #: [id number] Class: Historical Med  . Order #: [id number] Class: Historical Med  . Order #: [id number] Class: Normal  . Order #: [id number] Class: Historical Med  . Order #: [id number] Class: Print  . Order #: [id number] Class: Normal  . Order #: [id number] Class: Normal  . Order #: [id number] Class: Normal  . Order #: [id number] Class: Normal  . Order #: [id number] Class: Normal  . Order #: [id number] Class: Normal  . Order #: [id number] Class: Normal  . Order #: [id number] Class: Normal  . Order #: [id number] Class: Print  . Order #: [id number] Class: Normal  . Order #: [id number] Class: Normal  . Order #: [id number] Class: Normal    Allergies Sertraline hcl and Wellbutrin [bupropion]  Family History  Problem Relation Age of Onset  . Diabetes Mother   . Hypertension Mother   . Stroke Mother   . Diabetes Sister   . Heart disease Sister   . Kidney disease Father   . Drug abuse Brother   . Colon cancer Neg Hx   . Colon polyps Neg Hx     Social History Social History  Substance Use Topics  . Smoking status: Current Every Day Smoker    Packs/day: 1.00    Years: 48.00    Types: Cigarettes    Last attempt to quit: 05/02/2017  . Smokeless tobacco: Never Used     Comment: quit a week ago  . Alcohol use No    Review of Systems  Constitutional: No fever/chills Eyes: No visual changes. ENT: No sore throat. Cardiovascular: Denies chest pain. Respiratory: Denies shortness of breath. Gastrointestinal: No abdominal pain.  No nausea, no vomiting.  No diarrhea.  No constipation. Genitourinary: Negative for dysuria. Musculoskeletal: Negative for back pain. Positive right foot pain.  Skin: Positive right foot redness.  Neurological: Negative for headaches, focal weakness or numbness.  10-point ROS otherwise negative.  ____________________________________________   PHYSICAL EXAM:  VITAL SIGNS: ED Triage Vitals  Enc Vitals  Group     BP 07/03/17 0646 (!) 131/92     Pulse Rate 07/03/17 0646 (!) 104     Resp 07/03/17 0646 18     Temp 07/03/17 0646 98.3 F (36.8 C)     Temp Source 07/03/17 0646 Oral     SpO2 07/03/17 0646 95 %     Weight 07/03/17 0648 230 lb (104.3 kg)     Height 07/03/17 0648 6\' 1"  (1.854 m)     Pain Score 07/03/17 0646 7   Constitutional: Alert and oriented. Well appearing and in no acute distress. Eyes: Conjunctivae are normal.  Head: Atraumatic. Nose: No congestion/rhinnorhea. Mouth/Throat: Mucous membranes are moist.  Neck: No stridor.  Cardiovascular: Normal rate, regular rhythm. Good peripheral circulation. Grossly normal heart sounds.   Respiratory: Normal respiratory effort.  No retractions. Lungs CTAB. Gastrointestinal: Soft and nontender. No distention.  Musculoskeletal: No gross deformities of extremities. Mild right foot edema diffusely with an area of mild erythema over the dorsal aspect of the right foot. No fluctuance or drainage. No streaking redness up the leg. Pain with ROM  of the right ankle but no redness or swelling overlying the joint.  Neurologic:  Normal speech and language. No gross focal neurologic deficits are appreciated.  Skin:  Skin is warm, dry and intact. Erythema of the dorsal aspect of the foot as noted above.  Psychiatric: Mood and affect are normal. Speech and behavior are normal.  ____________________________________________   LABS (all labs ordered are listed, but only abnormal results are displayed)  Labs Reviewed  BASIC METABOLIC PANEL - Abnormal; Notable for the following:       Result Value   Sodium 133 (*)    Chloride 98 (*)    Glucose, Bld 146 (*)    Creatinine, Ser 1.59 (*)    GFR calc non Af Amer 43 (*)    GFR calc Af Amer 50 (*)    All other components within normal limits  CBC WITH DIFFERENTIAL/PLATELET   ____________________________________________  RADIOLOGY  Dg Foot Complete Right  Result Date: 07/03/2017 CLINICAL DATA:   Pain and redness. EXAM: RIGHT FOOT COMPLETE - 3+ VIEW COMPARISON:  No recent prior . FINDINGS: Diffuse degenerative change. No evidence of fracture or dislocation. No acute abnormality. No radiopaque foreign body. IMPRESSION: Diffuse degenerative change.  No acute abnormality. Electronically Signed   By: Marcello Moores  Register   On: 07/03/2017 07:58    ____________________________________________   PROCEDURES  Procedure(s) performed:   Procedures  None ____________________________________________   INITIAL IMPRESSION / ASSESSMENT AND PLAN / ED COURSE  Pertinent labs & imaging results that were available during my care of the patient were reviewed by me and considered in my medical decision making (see chart for details).  Patient resents to the emergency department for evaluation of right foot redness that is localized over the mid dorsal aspect of the foot. No ankle redness or swelling. No calf redness, tenderness, swelling. No evidence to suggest DVT or septic joint. The patient has no open wound in this area. No history of trauma. No open wounds on the bottoms of the feet. Plan for plain film to rule out bony injury. This is most consistent with a cellulitis rather than deep space infection. Will obtain labs with patient's tachycardia and history of renal disease.  08:39 AM Patient creatinine is at baseline. No leukocytosis. X-ray with no bony injury. Plan for treatment as outpatient with doxycycline. Discussed importance of 24-48 hour follow-up with primary care physician. Discussed return precautions to the emergency department in detail. Patient provided and fitted for crutches in the emergency department prior to discharge. Pain improved at discharge. Tachycardia on arrival is likely pain related.   At this time, I do not feel there is any life-threatening condition present. I have reviewed and discussed all results (EKG, imaging, lab, urine as appropriate), exam findings with patient. I  have reviewed nursing notes and appropriate previous records.  I feel the patient is safe to be discharged home without further emergent workup. Discussed usual and customary return precautions. Patient and family (if present) verbalize understanding and are comfortable with this plan.  Patient will follow-up with their primary care provider. If they do not have a primary care provider, information for follow-up has been provided to them. All questions have been answered.  ____________________________________________  FINAL CLINICAL IMPRESSION(S) / ED DIAGNOSES  Final diagnoses:  Foot pain, right  Cellulitis of right lower extremity     MEDICATIONS GIVEN DURING THIS VISIT:  Medications  traMADol (ULTRAM) tablet 50 mg (50 mg Oral Given 07/03/17 0735)     NEW OUTPATIENT  MEDICATIONS STARTED DURING THIS VISIT:  New Prescriptions   DOXYCYCLINE (VIBRAMYCIN) 100 MG CAPSULE    Take 1 capsule (100 mg total) by mouth 2 (two) times daily.   TRAMADOL (ULTRAM) 50 MG TABLET    Take 1 tablet (50 mg total) by mouth every 6 (six) hours as needed.      Note:  This document was prepared using Dragon voice recognition software and may include unintentional dictation errors.  Nanda Quinton, MD Emergency Medicine   Josselyn Harkins, [facility], MD 07/03/17 [phone]

## 2017-07-05 ENCOUNTER — Other Ambulatory Visit: Payer: Self-pay

## 2017-07-05 DIAGNOSIS — I10 Essential (primary) hypertension: Secondary | ICD-10-CM | POA: Diagnosis not present

## 2017-07-05 DIAGNOSIS — E1169 Type 2 diabetes mellitus with other specified complication: Secondary | ICD-10-CM | POA: Diagnosis not present

## 2017-07-05 DIAGNOSIS — Z125 Encounter for screening for malignant neoplasm of prostate: Secondary | ICD-10-CM | POA: Diagnosis not present

## 2017-07-05 DIAGNOSIS — E785 Hyperlipidemia, unspecified: Secondary | ICD-10-CM | POA: Diagnosis not present

## 2017-07-05 NOTE — Patient Outreach (Signed)
Outreach Patient after ED visit on 07/03/17.  Patient is doing ok and has left a message with Doctors offices to schedule a follow up appointment.  Patient is familiar with [facility] services he has worked with [place] before.  He states he already has hour number and will reach out to [place] if services are needed.

## 2017-07-06 LAB — COMPLETE METABOLIC PANEL WITH GFR
ALT: 31 U/L (ref 9–46)
AST: 25 U/L (ref 10–35)
Albumin: 3.9 g/dL (ref 3.6–5.1)
Alkaline Phosphatase: 51 U/L (ref 40–115)
BUN: 14 mg/dL (ref 7–25)
CO2: 19 mmol/L — ABNORMAL LOW (ref 20–31)
Calcium: 10 mg/dL (ref 8.6–10.3)
Chloride: 101 mmol/L (ref 98–110)
Creat: 1.6 mg/dL — ABNORMAL HIGH (ref 0.70–1.25)
GFR, Est African American: 51 mL/min — ABNORMAL LOW (ref >=60)
GFR, Est Non African American: 44 mL/min — ABNORMAL LOW (ref >=60)
Glucose, Bld: 125 mg/dL — ABNORMAL HIGH (ref 65–99)
Potassium: 3.9 mmol/L (ref 3.5–5.3)
Sodium: 136 mmol/L (ref 135–146)
Total Bilirubin: 0.9 mg/dL (ref 0.2–1.2)
Total Protein: 7.2 g/dL (ref 6.1–8.1)

## 2017-07-06 LAB — PSA: PSA: 5.9 ng/mL — ABNORMAL HIGH (ref <=4.0)

## 2017-07-06 LAB — LIPID PANEL
Cholesterol: 164 mg/dL (ref <200)
HDL: 36 mg/dL — ABNORMAL LOW (ref >40)
LDL Cholesterol: 91 mg/dL (ref <100)
Total CHOL/HDL Ratio: 4.6 Ratio (ref <5.0)
Triglycerides: 183 mg/dL — ABNORMAL HIGH (ref <150)
VLDL: 37 mg/dL — ABNORMAL HIGH (ref <30)

## 2017-07-06 LAB — HEMOGLOBIN A1C
Hgb A1c MFr Bld: 6.4 % — ABNORMAL HIGH (ref <5.7)
Mean Plasma Glucose: 137 mg/dL

## 2017-07-23 ENCOUNTER — Other Ambulatory Visit: Payer: Self-pay | Admitting: Family Medicine

## 2017-07-23 NOTE — Telephone Encounter (Signed)
Do you want to keep this patient on Valsartan?

## 2017-07-24 ENCOUNTER — Other Ambulatory Visit: Payer: Self-pay | Admitting: Family Medicine

## 2017-07-24 MED ORDER — RISPERIDONE 1 MG PO TABS: 1.0000 mg | ORAL_TABLET | Freq: Every day | ORAL | 5 refills | Status: DC

## 2017-07-24 MED ORDER — LOSARTAN POTASSIUM 100 MG PO TABS: 100.0000 mg | ORAL_TABLET | Freq: Every day | ORAL | 5 refills | Status: DC

## 2017-07-28 ENCOUNTER — Other Ambulatory Visit: Payer: Self-pay | Admitting: Family Medicine

## 2017-08-01 ENCOUNTER — Ambulatory Visit: Payer: Self-pay | Admitting: Family Medicine

## 2017-08-01 ENCOUNTER — Encounter: Payer: Self-pay | Admitting: Family Medicine

## 2017-08-01 ENCOUNTER — Ambulatory Visit (INDEPENDENT_AMBULATORY_CARE_PROVIDER_SITE_OTHER): Payer: Medicare Other | Admitting: Family Medicine

## 2017-08-01 VITALS — BP 112/76 | HR 69 | Temp 98.5°F | Resp 18 | Ht 73.0 in | Wt 235.8 lb

## 2017-08-01 DIAGNOSIS — J42 Unspecified chronic bronchitis: Secondary | ICD-10-CM | POA: Diagnosis not present

## 2017-08-01 DIAGNOSIS — E669 Obesity, unspecified: Secondary | ICD-10-CM | POA: Diagnosis not present

## 2017-08-01 DIAGNOSIS — E1169 Type 2 diabetes mellitus with other specified complication: Secondary | ICD-10-CM

## 2017-08-01 DIAGNOSIS — J449 Chronic obstructive pulmonary disease, unspecified: Secondary | ICD-10-CM | POA: Diagnosis not present

## 2017-08-01 DIAGNOSIS — Z720 Tobacco use: Secondary | ICD-10-CM

## 2017-08-01 DIAGNOSIS — F17210 Nicotine dependence, cigarettes, uncomplicated: Secondary | ICD-10-CM

## 2017-08-01 DIAGNOSIS — I10 Essential (primary) hypertension: Secondary | ICD-10-CM

## 2017-08-01 DIAGNOSIS — F209 Schizophrenia, unspecified: Secondary | ICD-10-CM

## 2017-08-01 DIAGNOSIS — R972 Elevated prostate specific antigen [PSA]: Secondary | ICD-10-CM | POA: Diagnosis not present

## 2017-08-01 MED ORDER — OLMESARTAN MEDOXOMIL 40 MG PO TABS: 40.0000 mg | ORAL_TABLET | Freq: Every day | ORAL | 5 refills | Status: DC

## 2017-08-01 NOTE — Patient Instructions (Addendum)
Physical exam in 4 month, call if you need me sooner  You are referred to urology re prostate  New instead of valsartan is olmasartan 40 mg one daily    Please work on good  health habits so that your health will improve. 1. Commitment to daily physical activity for 30 to 60  minutes, if you are able to do this.  2. Commitment to wise food choices. Aim for half of your  food intake to be vegetable and fruit, one quarter starchy foods, and one quarter protein. Try to eat on a regular schedule  3 meals per day, snacking between meals should be limited to vegetables or fruits or small portions of nuts. 64 ounces of water per day is generally recommended, unless you have specific health conditions, like heart failure or kidney failure where you will need to limit fluid intake.  3. Commitment to sufficient and a  good quality of physical and mental rest daily, generally between 6 to 8 hours per day.  WITH PERSISTANCE AND PERSEVERANCE, THE IMPOSSIBLE , BECOMES THE NORM!      Steps to Quit Smoking Smoking tobacco can be bad for your health. It can also affect almost every organ in your body. Smoking puts you and people around you at risk for many serious long-lasting (chronic) diseases. Quitting smoking is hard, but it is one of the best things that you can do for your health. It is never too late to quit. What are the benefits of quitting smoking? When you quit smoking, you lower your risk for getting serious diseases and conditions. They can include:  Lung cancer or lung disease.  Heart disease.  Stroke.  Heart attack.  Not being able to have children (infertility).  Weak bones (osteoporosis) and broken bones (fractures).  If you have coughing, wheezing, and shortness of breath, those symptoms may get better when you quit. You may also get sick less often. If you are pregnant, quitting smoking can help to lower your chances of having a baby of low birth weight. What can I do to  help me quit smoking? Talk with your doctor about what can help you quit smoking. Some things you can do (strategies) include:  Quitting smoking totally, instead of slowly cutting back how much you smoke over a period of time.  Going to in-person counseling. You are more likely to quit if you go to many counseling sessions.  Using resources and support systems, such as: ? Database administrator with a Social worker. ? Phone quitlines. ? Careers information officer. ? Support groups or group counseling. ? Text messaging programs. ? Mobile phone apps or applications.  Taking medicines. Some of these medicines may have nicotine in them. If you are pregnant or breastfeeding, do not take any medicines to quit smoking unless your doctor says it is okay. Talk with your doctor about counseling or other things that can help you.  Talk with your doctor about using more than one strategy at the same time, such as taking medicines while you are also going to in-person counseling. This can help make quitting easier. What things can I do to make it easier to quit? Quitting smoking might feel very hard at first, but there is a lot that you can do to make it easier. Take these steps:  Talk to your family and friends. Ask them to support and encourage you.  Call phone quitlines, reach out to support groups, or work with a Social worker.  Ask people who smoke to  not smoke around you.  Avoid places that make you want (trigger) to smoke, such as: ? Bars. ? Parties. ? Smoke-break areas at work.  Spend time with people who do not smoke.  Lower the stress in your life. Stress can make you want to smoke. Try these things to help your stress: ? Getting regular exercise. ? Deep-breathing exercises. ? Yoga. ? Meditating. ? Doing a body scan. To do this, close your eyes, focus on one area of your body at a time from head to toe, and notice which parts of your body are tense. Try to relax the muscles in those  areas.  Download or buy apps on your mobile phone or tablet that can help you stick to your quit plan. There are many free apps, such as QuitGuide from the [employer] Office manager for Disease Control and Prevention). You can find more support from smokefree.gov and other websites.  This information is not intended to replace advice given to you by your health care provider. Make sure you discuss any questions you have with your health care provider. Document Released: 09/30/2009 Document Revised: 08/01/2016 Document Reviewed: 04/20/2015 Elsevier Interactive Patient Education  2018 [employer].

## 2017-08-04 ENCOUNTER — Encounter: Payer: Self-pay | Admitting: Family Medicine

## 2017-08-04 NOTE — Assessment & Plan Note (Signed)
reder to urology for follow up and surveillance

## 2017-08-04 NOTE — Assessment & Plan Note (Signed)
Currently stable, no change in  medication

## 2017-08-04 NOTE — Assessment & Plan Note (Signed)
Stable currently continue chronic medications

## 2017-08-04 NOTE — Progress Notes (Signed)
EDWIN BAINES     MRN: [id number]      DOB: 1951/11/24   HPI Mr. Nadal is here for follow up and re-evaluation of chronic medical conditions, medication management and review of any available recent lab and radiology data.  Preventive health is updated, specifically  Cancer screening and Immunization.   Questions or concerns regarding consultations or procedures which the PT has had in the interim are  addressed. The PT denies any adverse reactions to current medications since the last visit.  There are no new concerns.  There are no specific complaints   ROS Denies recent fever or chills. Denies sinus pressure, nasal congestion, ear pain or sore throat. Denies chest congestion, productive cough or wheezing. Denies chest pains, palpitations and leg swelling Denies abdominal pain, nausea, vomiting,diarrhea or constipation.   Denies dysuria, frequency, hesitancy or incontinence. Denies uncontrolled joint pain, swelling and limitation in mobility. Denies headaches, seizures, numbness, or tingling. Denies uncontrolled  depression, anxiety or insomnia. Denies skin break down or rash.   PE  BP 112/76 (BP Location: Left Arm, Patient Position: Sitting, Cuff Size: Large)   Pulse 69   Temp 98.5 F (36.9 C) (Other (Comment))   Resp 18   Ht 6\' 1"  (1.854 m)   Wt 235 lb 12 oz (106.9 kg)   SpO2 96%   BMI 31.10 kg/m   Patient alert and oriented and in no cardiopulmonary distress.  HEENT: No facial asymmetry, EOMI,   oropharynx pink and moist.  Neck supple no JVD, no mass.  Chest: Clear to auscultation bilaterally.decreased air entry bilaterally CVS: S1, S2 no murmurs, no S3.Regular rate.  ABD: Soft non tender.   Ext: No edema  MS: Decreased though Adequate ROM spine, shoulders, hips and knees.  Skin: Intact, no ulcerations or rash noted.  Psych: Good eye contact, normal affect. Memory intact not anxious or depressed appearing.  CNS: CN 2-12 intact, power,  normal  throughout.no focal deficits noted.   Assessment & Plan  Hypertension Controlled, no change in medication DASH diet and commitment to daily physical activity for a minimum of 30 minutes discussed and encouraged, as a part of hypertension management. The importance of attaining a healthy weight is also discussed.  BP/Weight 08/01/2017 07/03/2017 05/09/2017 04/17/2017 04/13/2017 02/06/2017 6/56/8127  Systolic BP 517 001 749 449 675 916 -  Diastolic BP 76 72 80 82 69 83 -  Wt. (Lbs) 235.75 230 234.08 235 234.1 221.78 -  BMI 31.1 30.34 30.88 31 30.89 - 29.26  Some encounter information is confidential and restricted. Go to Review Flowsheets activity to see all data.       Hyperlipidemia Hyperlipidemia:Low fat diet discussed and encouraged.   Lipid Panel  Lab Results  Component Value Date   CHOL 164 07/05/2017   HDL 36 (L) 07/05/2017   LDLCALC 91 07/05/2017   TRIG 183 (H) 07/05/2017   CHOLHDL 4.6 07/05/2017    .  Obesity (BMI 30.0-34.9) Deteriorated. Patient re-educated about  the importance of commitment to a  minimum of 150 minutes of exercise per week.  The importance of healthy food choices with portion control discussed. Encouraged to start a food diary, count calories and to consider  joining a support group. Sample diet sheets offered. Goals set by the patient for the next several months.   Weight /BMI 08/01/2017 07/03/2017 05/09/2017  WEIGHT 235 lb 12 oz 230 lb 234 lb 1.3 oz  HEIGHT 6\' 1"  6\' 1"  6\' 1"   BMI 31.1 kg/m2 30.34 kg/m2  30.88 kg/m2  Some encounter information is confidential and restricted. Go to Review Flowsheets activity to see all data.      Elevated PSA reder to urology for follow up and surveillance  COPD (chronic obstructive pulmonary disease) ([place]) Stable currently continue chronic medications  Schizophrenia, unspecified type ([place]) Currently stable, no change in  medication  Diabetes mellitus type 2 in obese ([facility]) Controlled, no change in  medication Mr. Lohse is reminded of the importance of commitment to daily physical activity for 30 minutes or more, as able and the need to limit carbohydrate intake to 30 to 60 grams per meal to help with blood sugar control.   The need to take medication as prescribed, test blood sugar as directed, and to call between visits if there is a concern that blood sugar is uncontrolled is also discussed.   Mr. Culpepper is reminded of the importance of daily foot exam, annual eye examination, and good blood sugar, blood pressure and cholesterol control.  Diabetic Labs Latest Ref Rng & Units 07/05/2017 07/03/2017 04/17/2017 04/13/2017 04/12/2017  HbA1c <5.7 % 6.4(H) - - - -  Microalbumin Not Estab. ug/mL - - <3.0(H) - -  Micro/Creat Ratio 0.0 - 30.0 mg/g creat - - <4.1 - -  Chol <200 mg/dL 164 - - - -  HDL >40 mg/dL 36(L) - - - -  Calc LDL <100 mg/dL 91 - - - -  Triglycerides <150 mg/dL 183(H) - - - -  Creatinine 0.70 - 1.25 mg/dL 1.60(H) 1.59(H) - 1.53(H) 1.54(H)   BP/Weight 08/01/2017 07/03/2017 05/09/2017 04/17/2017 04/13/2017 02/06/2017 9/45/8592  Systolic BP 924 462 863 817 711 657 -  Diastolic BP 76 72 80 82 69 83 -  Wt. (Lbs) 235.75 230 234.08 235 234.1 221.78 -  BMI 31.1 30.34 30.88 31 30.89 - 29.26  Some encounter information is confidential and restricted. Go to Review Flowsheets activity to see all data.   Foot/eye exam completion dates Latest Ref Rng & Units 07/10/2016 08/30/2015  Eye Exam No Retinopathy - -  Foot Form Completion - Done Done        Tobacco abuse Patient is asked and  confirms current  Nicotine use.  Five to seven minutes of time is spent in counseling the patient of the need to quit smoking  Advice to quit is delivered clearly specifically in reducing the risk of developing heart disease, having a stroke, or of developing all types of cancer, especially lung and oral cancer. Improvement in breathing and exercise tolerance and quality of life is also discussed, as is the  economic benefit.  Assessment of willingness to quit or to make an attempt to quit is made and documented  Assistance in quit attempt is made with several and varied options presented, based on patient's desire and need. These include  literature, local classes available, 1800 QUIT NOW number, OTC and prescription medication.  The GOAL to be NICOTINE FREE is re emphasized.  The patient has set a personal goal of either reduction or discontinuation and follow up is arranged between 6 an 16 weeks.

## 2017-08-04 NOTE — Assessment & Plan Note (Signed)

## 2017-08-04 NOTE — Assessment & Plan Note (Signed)
Controlled, no change in medication DASH diet and commitment to daily physical activity for a minimum of 30 minutes discussed and encouraged, as a part of hypertension management. The importance of attaining a healthy weight is also discussed.  BP/Weight 08/01/2017 07/03/2017 05/09/2017 04/17/2017 04/13/2017 02/06/2017 12/26/3233  Systolic BP 573 220 254 270 623 762 -  Diastolic BP 76 72 80 82 69 83 -  Wt. (Lbs) 235.75 230 234.08 235 234.1 221.78 -  BMI 31.1 30.34 30.88 31 30.89 - 29.26  Some encounter information is confidential and restricted. Go to Review Flowsheets activity to see all data.

## 2017-08-04 NOTE — Assessment & Plan Note (Signed)
Hyperlipidemia:Low fat diet discussed and encouraged.   Lipid Panel  Lab Results  Component Value Date   CHOL 164 07/05/2017   HDL 36 (L) 07/05/2017   LDLCALC 91 07/05/2017   TRIG 183 (H) 07/05/2017   CHOLHDL 4.6 07/05/2017    .

## 2017-08-04 NOTE — Assessment & Plan Note (Signed)
Deteriorated. Patient re-educated about  the importance of commitment to a  minimum of 150 minutes of exercise per week.  The importance of healthy food choices with portion control discussed. Encouraged to start a food diary, count calories and to consider  joining a support group. Sample diet sheets offered. Goals set by the patient for the next several months.   Weight /BMI 08/01/2017 07/03/2017 05/09/2017  WEIGHT 235 lb 12 oz 230 lb 234 lb 1.3 oz  HEIGHT 6\' 1"  6\' 1"  6\' 1"   BMI 31.1 kg/m2 30.34 kg/m2 30.88 kg/m2  Some encounter information is confidential and restricted. Go to Review Flowsheets activity to see all data.

## 2017-08-04 NOTE — Assessment & Plan Note (Signed)
Controlled, no change in medication Matthew Molina is reminded of the importance of commitment to daily physical activity for 30 minutes or more, as able and the need to limit carbohydrate intake to 30 to 60 grams per meal to help with blood sugar control.   The need to take medication as prescribed, test blood sugar as directed, and to call between visits if there is a concern that blood sugar is uncontrolled is also discussed.   Matthew Molina is reminded of the importance of daily foot exam, annual eye examination, and good blood sugar, blood pressure and cholesterol control.  Diabetic Labs Latest Ref Rng & Units 07/05/2017 07/03/2017 04/17/2017 04/13/2017 04/12/2017  HbA1c <5.7 % 6.4(H) - - - -  Microalbumin Not Estab. ug/mL - - <3.0(H) - -  Micro/Creat Ratio 0.0 - 30.0 mg/g creat - - <4.1 - -  Chol <200 mg/dL 164 - - - -  HDL >40 mg/dL 36(L) - - - -  Calc LDL <100 mg/dL 91 - - - -  Triglycerides <150 mg/dL 183(H) - - - -  Creatinine 0.70 - 1.25 mg/dL 1.60(H) 1.59(H) - 1.53(H) 1.54(H)   BP/Weight 08/01/2017 07/03/2017 05/09/2017 04/17/2017 04/13/2017 02/06/2017 9/64/3838  Systolic BP 184 037 543 606 770 340 -  Diastolic BP 76 72 80 82 69 83 -  Wt. (Lbs) 235.75 230 234.08 235 234.1 221.78 -  BMI 31.1 30.34 30.88 31 30.89 - 29.26  Some encounter information is confidential and restricted. Go to Review Flowsheets activity to see all data.   Foot/eye exam completion dates Latest Ref Rng & Units 07/10/2016 08/30/2015  Eye Exam No Retinopathy - -  Foot Form Completion - Done Done

## 2017-08-07 ENCOUNTER — Telehealth: Payer: Self-pay | Admitting: Family Medicine

## 2017-08-07 NOTE — Telephone Encounter (Signed)
Patient left message, wants to know if it is safe to cut mole off his face- by himself.

## 2017-08-07 NOTE — Telephone Encounter (Signed)
Patient informed of message below, verbalized understanding.  

## 2017-08-07 NOTE — Telephone Encounter (Signed)
no

## 2017-08-30 ENCOUNTER — Telehealth: Payer: Self-pay

## 2017-08-30 NOTE — Telephone Encounter (Signed)
I spoke directly with the pt and explained why he is on the med instead of the diovan which has been withdrawn and also that it is protective of the kidneys, he is appreciative, will take tylenol if needed for headache and will no longer  worry about medication, , will call back if needed

## 2017-08-30 NOTE — Telephone Encounter (Signed)
Patient called complaining of his blood pressure medication (losartan).  He does not like how it is making him feel.  He says it gives him a headache and causes kidney issues.  Patient was hard to understand on voicemail.  Please advise if change is appropriate.

## 2017-09-01 DIAGNOSIS — J449 Chronic obstructive pulmonary disease, unspecified: Secondary | ICD-10-CM | POA: Diagnosis not present

## 2017-09-07 ENCOUNTER — Ambulatory Visit (INDEPENDENT_AMBULATORY_CARE_PROVIDER_SITE_OTHER): Payer: Medicare Other | Admitting: Podiatry

## 2017-09-07 ENCOUNTER — Encounter: Payer: Self-pay | Admitting: Podiatry

## 2017-09-07 DIAGNOSIS — Q828 Other specified congenital malformations of skin: Secondary | ICD-10-CM

## 2017-09-07 DIAGNOSIS — M2141 Flat foot [pes planus] (acquired), right foot: Secondary | ICD-10-CM

## 2017-09-07 DIAGNOSIS — E1140 Type 2 diabetes mellitus with diabetic neuropathy, unspecified: Secondary | ICD-10-CM | POA: Diagnosis not present

## 2017-09-07 DIAGNOSIS — M79676 Pain in unspecified toe(s): Secondary | ICD-10-CM

## 2017-09-07 DIAGNOSIS — B351 Tinea unguium: Secondary | ICD-10-CM

## 2017-09-07 DIAGNOSIS — M2142 Flat foot [pes planus] (acquired), left foot: Secondary | ICD-10-CM

## 2017-09-07 DIAGNOSIS — E1149 Type 2 diabetes mellitus with other diabetic neurological complication: Secondary | ICD-10-CM

## 2017-09-07 NOTE — Progress Notes (Signed)
Patient ID: Matthew Molina, male   DOB: [birth year], 66 y.o.   MRN: [id number] Complaint:  Visit Type: Patient returns to my office for  preventative foot care services. Complaint: Patient states" my nails have grown long and thick and become painful to walk and wear shoes" Patient has been diagnosed with DM with no foot complications. The patient presents for preventative foot care services. No changes to ROS He relates two painful callus under both feet.    Podiatric Exam: Vascular: dorsalis pedis and posterior tibial pulses are palpable bilateral. Capillary return is immediate. Temperature gradient is WNL. Skin turgor WNL  Sensorium: Normal Semmes Weinstein monofilament test. Normal tactile sensation bilaterally. Nail Exam: Pt has thick disfigured discolored nails with subungual debris noted bilateral entire nail hallux through fifth toenails Ulcer Exam: There is no evidence of ulcer or pre-ulcerative changes or infection. Orthopedic Exam: Muscle tone and strength are WNL. No limitations in general ROM. No crepitus or effusions noted. Foot type and digits show no abnormalities. Bony prominences are unremarkable.Mild brownish discoloration 1st MPJ right foot. Skin:  Porokeratosis sub 5th metabase both feet.. No infection or ulcers  Diagnosis:  Onychomycosis, , Pain in right toe, pain in left toes, Porokeratosis  Treatment & Plan Procedures and Treatment: Consent by patient was obtained for treatment procedures. The patient understood the discussion of treatment and procedures well. All questions were answered thoroughly reviewed. Debridement of mycotic and hypertrophic toenails, 1 through 5 bilateral and clearing of subungual debris. No ulceration, no infection noted. Debride porokeratosis. Return Visit-Office Procedure: Patient instructed to return to the office for a follow up visit 3 months for continued evaluation and treatment.    Gardiner Barefoot DPM

## 2017-09-19 ENCOUNTER — Other Ambulatory Visit: Payer: Self-pay | Admitting: Family Medicine

## 2017-09-28 ENCOUNTER — Ambulatory Visit: Payer: Self-pay | Admitting: Urology

## 2017-10-01 DIAGNOSIS — J449 Chronic obstructive pulmonary disease, unspecified: Secondary | ICD-10-CM | POA: Diagnosis not present

## 2017-10-22 ENCOUNTER — Other Ambulatory Visit: Payer: Self-pay | Admitting: Family Medicine

## 2017-10-23 ENCOUNTER — Other Ambulatory Visit: Payer: Self-pay | Admitting: Family Medicine

## 2017-11-01 DIAGNOSIS — J449 Chronic obstructive pulmonary disease, unspecified: Secondary | ICD-10-CM | POA: Diagnosis not present

## 2017-11-03 ENCOUNTER — Other Ambulatory Visit: Payer: Self-pay | Admitting: Family Medicine

## 2017-11-05 NOTE — Telephone Encounter (Signed)
Seen 09/07/17

## 2017-11-13 ENCOUNTER — Telehealth: Payer: Self-pay | Admitting: *Deleted

## 2017-11-13 ENCOUNTER — Other Ambulatory Visit: Payer: Self-pay | Admitting: Family Medicine

## 2017-11-13 MED ORDER — PREDNISONE 5 MG (21) PO TBPK: 5.0000 mg | ORAL_TABLET | ORAL | 0 refills | Status: DC

## 2017-11-13 NOTE — Telephone Encounter (Signed)
Patient states he has been coughing for 2 days- worse at night and he is unable to produce any phlegm. No fever that he knows of. Requesting either cough pill or syrup, whichever will work best for him

## 2017-11-13 NOTE — Telephone Encounter (Signed)
Patient call requesting cough medicine to be called in to Matthew Molina. Please call patient if this can be done.

## 2017-11-13 NOTE — Telephone Encounter (Signed)
Prednisone dose pack prescribed and pt is aware, cough likely due to cOPD flare

## 2017-11-19 ENCOUNTER — Other Ambulatory Visit: Payer: Self-pay | Admitting: Family Medicine

## 2017-11-21 IMAGING — CT CT CHEST LUNG CANCER SCREENING LOW DOSE W/O CM
3 of 5 series · 13 of 40 positions shown, 14 images · non-contrast
Comparison: Low-dose lung cancer screening chest CT 07/06/2014.

CLINICAL DATA: 66-year-old male current smoker with 30 pack-year
history of smoking. Lung cancer screening examination.

EXAM:
CT CHEST WITHOUT CONTRAST LOW-DOSE FOR LUNG CANCER SCREENING
TECHNIQUE: Multidetector CT imaging of the chest was performed following the
standard protocol without IV contrast.

[Series 2: axial st · axial · 0.84mm/px · z∈[-159,-59]mm · 2 of 62 slices shown, 3 images]
[im 21/62  mediastinal]
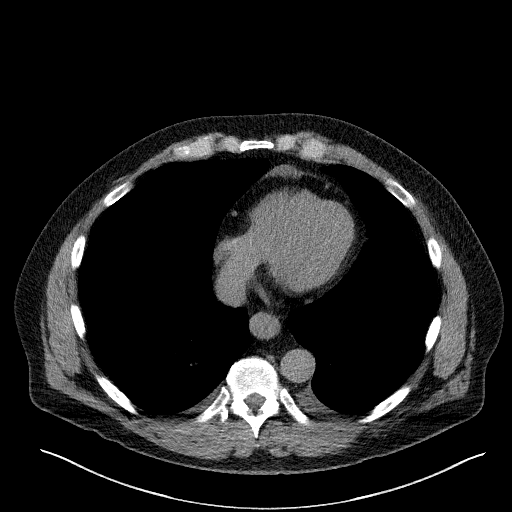
[im 21/62  lung]
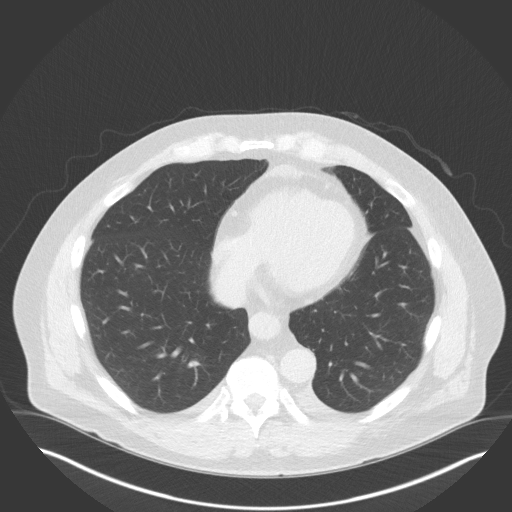
[im 41/62  lung]
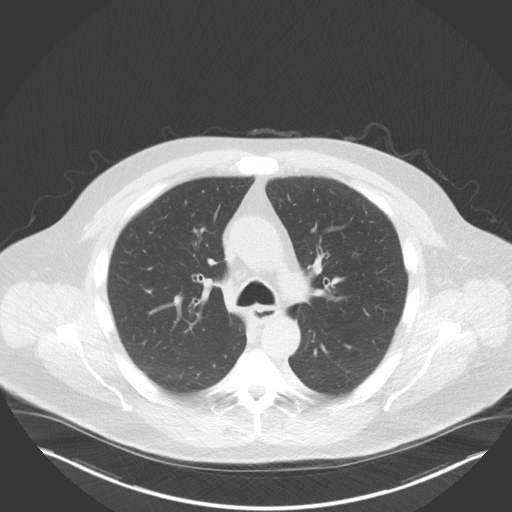

[Series 4: lungs · axial · 0.84mm/px · z∈[-210,+20]mm · 8 of 282 slices shown]
[im 26/282  lung]
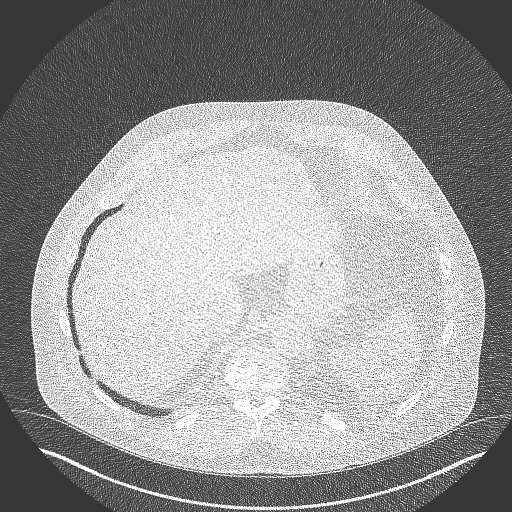
[im 52/282  lung]
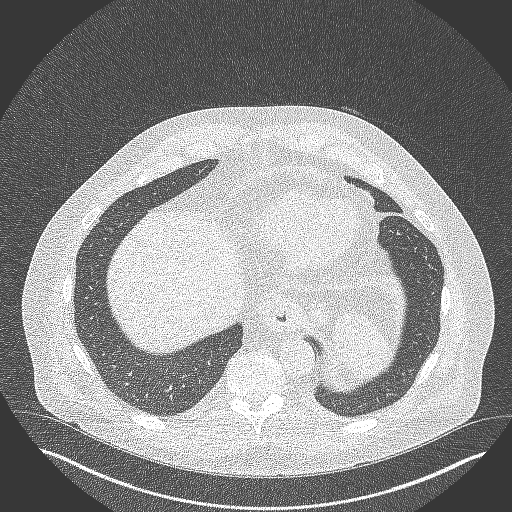
[im 90/282  lung]
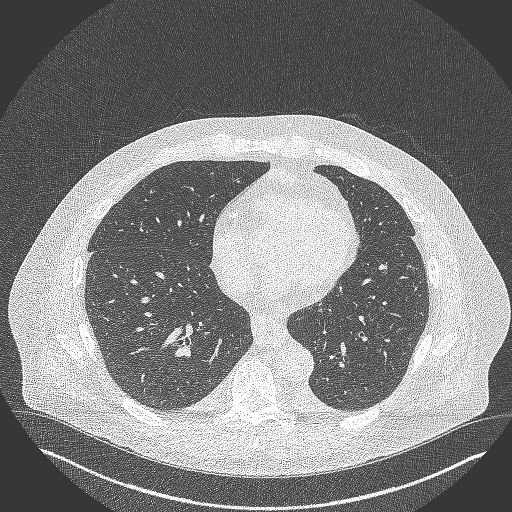
[im 128/282  lung]
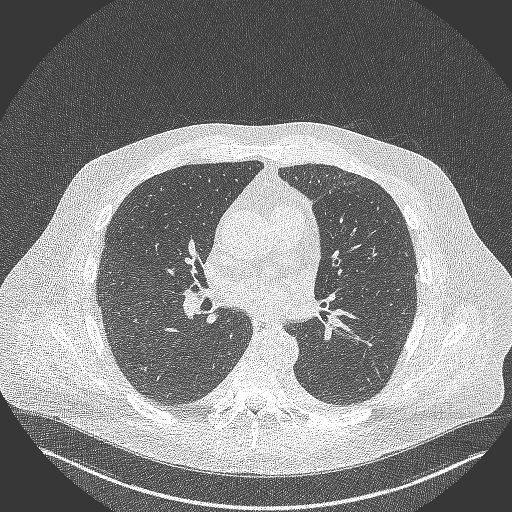
[im 154/282  lung]
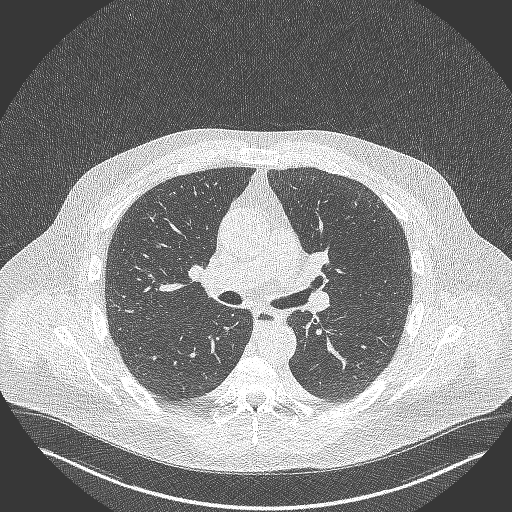
[im 192/282  lung]
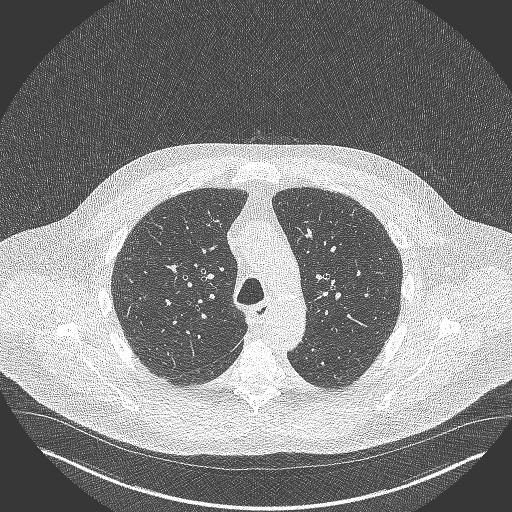
[im 230/282  lung]
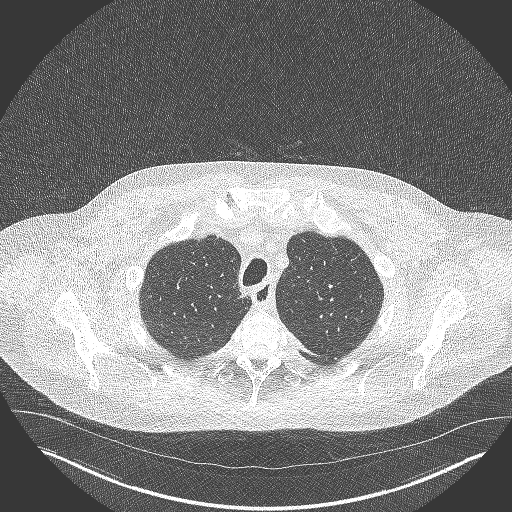
[im 256/282  lung]
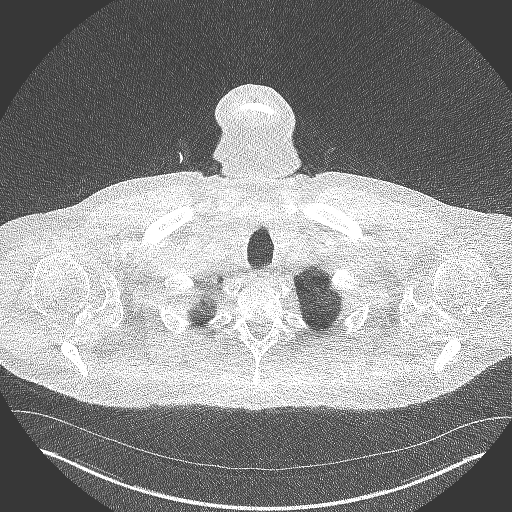

[Series 5: coronal · coronal · 0.59mm/px · 3 of 301 slices shown]
[im 61/301  lung]
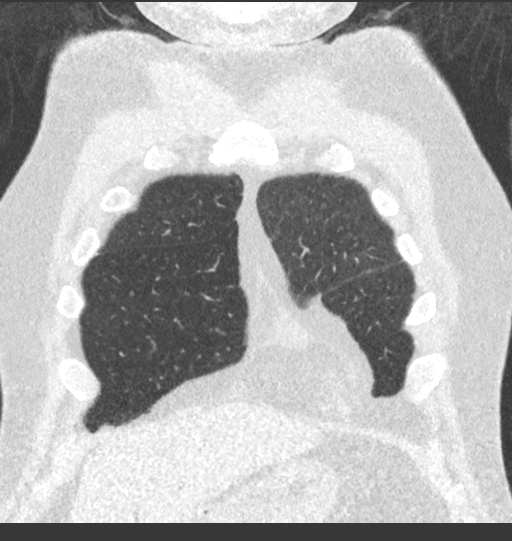
[im 121/301  lung]
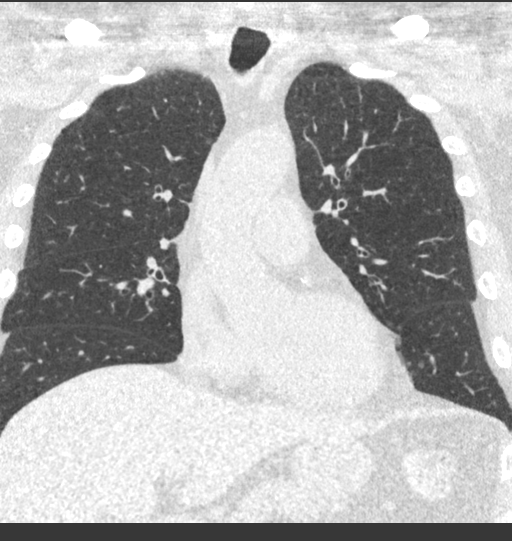
[im 181/301  lung]
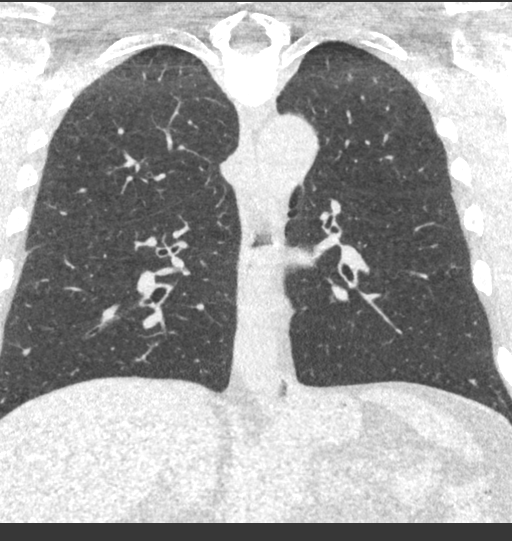

[13 of 40 positions shown; findings below may reference images not displayed]

FINDINGS: Cardiovascular: Heart size is normal. Small amount of pericardial
fluid and/or thickening anteriorly, unlikely to be of any
hemodynamic significance at this time. No associated pericardial
calcification. There is aortic atherosclerosis, as well as
atherosclerosis of the great vessels of the mediastinum and the
coronary arteries, including calcified atherosclerotic plaque in the
left main, left anterior descending, left circumflex and right
coronary arteries.

Mediastinum/Nodes: No pathologically enlarged mediastinal or hilar
lymph nodes. Please note that accurate exclusion of hilar adenopathy
is limited on noncontrast CT scans. Circumferential thickening of
the distal third of the esophagus. No axillary lymphadenopathy.

Lungs/Pleura: Small pulmonary nodules are noted in the lungs
bilaterally. The largest noncalcified pulmonary nodule is again in
the apex of the right upper lobe (image 48 of series 3) with a
volume derived mean diameter of only 2.9 mm. No other larger more
suspicious appearing pulmonary nodules or masses are noted. There is
no acute consolidative airspace disease. Trace bilateral pleural
effusions lie dependently. Mild diffuse bronchial wall thickening
with very mild centrilobular and paraseptal emphysema.

Upper Abdomen: This adreniform thickening of the adrenal glands
bilaterally is unchanged. Aortic atherosclerosis.

Musculoskeletal: There are no aggressive appearing lytic or blastic
lesions noted in the visualized portions of the skeleton.
IMPRESSION: 1. Lung-RADS Category 2S, benign appearance or behavior. Continue
annual screening with low-dose chest CT without contrast in 12
months.
2. The "S" modifier above refers to potentially clinically
significant non lung cancer related findings. Specifically, there is
aortic atherosclerosis, in addition to left main and 3 vessel
coronary artery disease. Please note that although the presence of
coronary artery calcium documents the presence of coronary artery
disease, the severity of this disease and any potential stenosis
cannot be assessed on this non-gated CT examination. Assessment for
potential risk factor modification, dietary therapy or pharmacologic
therapy may be warranted, if clinically indicated.
3. Trace bilateral pleural effusions lying dependently.
4. Mild diffuse bronchial wall thickening with very mild
centrilobular and paraseptal emphysema; imaging findings suggestive
of underlying COPD.

## 2017-11-28 ENCOUNTER — Encounter: Payer: Self-pay | Admitting: Podiatry

## 2017-11-28 ENCOUNTER — Ambulatory Visit (INDEPENDENT_AMBULATORY_CARE_PROVIDER_SITE_OTHER): Payer: Medicare Other | Admitting: Podiatry

## 2017-11-28 DIAGNOSIS — E1140 Type 2 diabetes mellitus with diabetic neuropathy, unspecified: Secondary | ICD-10-CM

## 2017-11-28 DIAGNOSIS — B351 Tinea unguium: Secondary | ICD-10-CM | POA: Diagnosis not present

## 2017-11-28 DIAGNOSIS — Q828 Other specified congenital malformations of skin: Secondary | ICD-10-CM

## 2017-11-28 DIAGNOSIS — E1149 Type 2 diabetes mellitus with other diabetic neurological complication: Secondary | ICD-10-CM

## 2017-11-28 DIAGNOSIS — M79676 Pain in unspecified toe(s): Secondary | ICD-10-CM

## 2017-11-28 NOTE — Progress Notes (Signed)
Patient ID: Matthew Molina, male   DOB: 1951/03/19, 66 y.o.   MRN: [id number] Complaint:  Visit Type: Patient returns to my office for  preventative foot care services. Complaint: Patient states" my nails have grown long and thick and become painful to walk and wear shoes" Patient has been diagnosed with DM with no foot complications. The patient presents for preventative foot care services. No changes to ROS He relates two painful callus under both feet.    Podiatric Exam: Vascular: dorsalis pedis and posterior tibial pulses are palpable bilateral. Capillary return is immediate. Temperature gradient is WNL. Skin turgor WNL  Sensorium: Normal Semmes Weinstein monofilament test. Normal tactile sensation bilaterally. Nail Exam: Pt has thick disfigured discolored nails with subungual debris noted bilateral entire nail hallux through fifth toenails Ulcer Exam: There is no evidence of ulcer or pre-ulcerative changes or infection. Orthopedic Exam: Muscle tone and strength are WNL. No limitations in general ROM. No crepitus or effusions noted. Foot type and digits show no abnormalities. Bony prominences are unremarkable.Mild brownish discoloration 1st MPJ right foot. Skin:  Porokeratosis sub 5th metabase both feet.. No infection or ulcers  Diagnosis:  Onychomycosis, , Pain in right toe, pain in left toes, Porokeratosis  Treatment & Plan Procedures and Treatment: Consent by patient was obtained for treatment procedures. The patient understood the discussion of treatment and procedures well. All questions were answered thoroughly reviewed. Debridement of mycotic and hypertrophic toenails, 1 through 5 bilateral and clearing of subungual debris. No ulceration, no infection noted. Debride porokeratosis. Return Visit-Office Procedure: Patient instructed to return to the office for a follow up visit 3 months for continued evaluation and treatment.    Gardiner Barefoot DPM

## 2017-12-01 DIAGNOSIS — J449 Chronic obstructive pulmonary disease, unspecified: Secondary | ICD-10-CM | POA: Diagnosis not present

## 2017-12-04 ENCOUNTER — Ambulatory Visit (INDEPENDENT_AMBULATORY_CARE_PROVIDER_SITE_OTHER): Payer: Medicare Other | Admitting: Family Medicine

## 2017-12-04 ENCOUNTER — Encounter: Payer: Self-pay | Admitting: Family Medicine

## 2017-12-04 VITALS — BP 118/78 | HR 102 | Resp 16 | Ht 73.0 in | Wt 240.0 lb

## 2017-12-04 DIAGNOSIS — F17210 Nicotine dependence, cigarettes, uncomplicated: Secondary | ICD-10-CM

## 2017-12-04 DIAGNOSIS — E669 Obesity, unspecified: Secondary | ICD-10-CM

## 2017-12-04 DIAGNOSIS — Z1211 Encounter for screening for malignant neoplasm of colon: Secondary | ICD-10-CM

## 2017-12-04 DIAGNOSIS — L089 Local infection of the skin and subcutaneous tissue, unspecified: Secondary | ICD-10-CM

## 2017-12-04 DIAGNOSIS — Z0000 Encounter for general adult medical examination without abnormal findings: Secondary | ICD-10-CM

## 2017-12-04 DIAGNOSIS — Z720 Tobacco use: Secondary | ICD-10-CM

## 2017-12-04 DIAGNOSIS — E1169 Type 2 diabetes mellitus with other specified complication: Secondary | ICD-10-CM | POA: Diagnosis not present

## 2017-12-04 DIAGNOSIS — Z23 Encounter for immunization: Secondary | ICD-10-CM

## 2017-12-04 DIAGNOSIS — I2510 Atherosclerotic heart disease of native coronary artery without angina pectoris: Secondary | ICD-10-CM

## 2017-12-04 LAB — POC HEMOCCULT BLD/STL (OFFICE/1-CARD/DIAGNOSTIC): Fecal Occult Blood, POC: NEGATIVE

## 2017-12-04 MED ORDER — GLIPIZIDE ER 5 MG PO TB24: 5.0000 mg | ORAL_TABLET | Freq: Every day | ORAL | 1 refills | Status: DC

## 2017-12-04 MED ORDER — OLMESARTAN MEDOXOMIL 40 MG PO TABS: 40.0000 mg | ORAL_TABLET | Freq: Every day | ORAL | 5 refills | Status: DC

## 2017-12-04 MED ORDER — PRAVASTATIN SODIUM 20 MG PO TABS: ORAL_TABLET | ORAL | 5 refills | Status: DC

## 2017-12-04 MED ORDER — TRIAMTERENE-HCTZ 37.5-25 MG PO TABS: ORAL_TABLET | ORAL | 1 refills | Status: DC

## 2017-12-04 NOTE — Assessment & Plan Note (Signed)
Abnormal chest scan with multiple cVB risk factors, refer to cardiology for further evaluation

## 2017-12-04 NOTE — Assessment & Plan Note (Signed)
Detriorated up to 30/day Patient is asked and  confirms current  Nicotine use.  Five to seven minutes of time is spent in counseling the patient of the need to quit smoking  Advice to quit is delivered clearly specifically in reducing the risk of developing heart disease, having a stroke, or of developing all types of cancer, especially lung and oral cancer. Improvement in breathing and exercise tolerance and quality of life is also discussed, as is the economic benefit.  Assessment of willingness to quit or to make an attempt to quit is made and documented  Assistance in quit attempt is made with several and varied options presented, based on patient's desire and need. These include  literature, local classes available, 1800 QUIT NOW number, OTC and prescription medication.  The GOAL to be NICOTINE FREE is re emphasized.  The patient has set a personal goal of either reduction or discontinuation and follow up is arranged between 6 an 16 weeks.

## 2017-12-04 NOTE — Assessment & Plan Note (Signed)

## 2017-12-04 NOTE — Patient Instructions (Addendum)
F/u in 4 months, call if you need me sooner.  hBA1C , chem 7 and eGFR today  Cut back to 1 PPD of cigarettes please, you need to qUIT  Flu vaccine today   You will be called with appt for lung scan due end of January  You are referred to  Cardiology One week of doxycycline prescribe for infected lesion on scalp, pt notified on 12/10/2017     Steps to Quit Smoking Smoking tobacco can be bad for your health. It can also affect almost every organ in your body. Smoking puts you and people around you at risk for many serious long-lasting (chronic) diseases. Quitting smoking is hard, but it is one of the best things that you can do for your health. It is never too late to quit. What are the benefits of quitting smoking? When you quit smoking, you lower your risk for getting serious diseases and conditions. They can include:  Lung cancer or lung disease.  Heart disease.  Stroke.  Heart attack.  Not being able to have children (infertility).  Weak bones (osteoporosis) and broken bones (fractures).  If you have coughing, wheezing, and shortness of breath, those symptoms may get better when you quit. You may also get sick less often. If you are pregnant, quitting smoking can help to lower your chances of having a baby of low birth weight. What can I do to help me quit smoking? Talk with your doctor about what can help you quit smoking. Some things you can do (strategies) include:  Quitting smoking totally, instead of slowly cutting back how much you smoke over a period of time.  Going to in-person counseling. You are more likely to quit if you go to many counseling sessions.  Using resources and support systems, such as: ? Database administrator with a Social worker. ? Phone quitlines. ? Careers information officer. ? Support groups or group counseling. ? Text messaging programs. ? Mobile phone apps or applications.  Taking medicines. Some of these medicines may have nicotine in them. If  you are pregnant or breastfeeding, do not take any medicines to quit smoking unless your doctor says it is okay. Talk with your doctor about counseling or other things that can help you.  Talk with your doctor about using more than one strategy at the same time, such as taking medicines while you are also going to in-person counseling. This can help make quitting easier. What things can I do to make it easier to quit? Quitting smoking might feel very hard at first, but there is a lot that you can do to make it easier. Take these steps:  Talk to your family and friends. Ask them to support and encourage you.  Call phone quitlines, reach out to support groups, or work with a Social worker.  Ask people who smoke to not smoke around you.  Avoid places that make you want (trigger) to smoke, such as: ? Bars. ? Parties. ? Smoke-break areas at work.  Spend time with people who do not smoke.  Lower the stress in your life. Stress can make you want to smoke. Try these things to help your stress: ? Getting regular exercise. ? Deep-breathing exercises. ? Yoga. ? Meditating. ? Doing a body scan. To do this, close your eyes, focus on one area of your body at a time from head to toe, and notice which parts of your body are tense. Try to relax the muscles in those areas.  Download or buy apps on  your mobile phone or tablet that can help you stick to your quit plan. There are many free apps, such as QuitGuide from the [employer] Office manager for Disease Control and Prevention). You can find more support from smokefree.gov and other websites.  This information is not intended to replace advice given to you by your health care provider. Make sure you discuss any questions you have with your health care provider. Document Released: 09/30/2009 Document Revised: 08/01/2016 Document Reviewed: 04/20/2015 Elsevier Interactive Patient Education  2018 [employer].

## 2017-12-04 NOTE — Assessment & Plan Note (Signed)
Updated lab needed at/ before next visit. [name] is reminded of the importance of commitment to daily physical activity for 30 minutes or more, as able and the need to limit carbohydrate intake to 30 to 60 grams per meal to help with blood sugar control.   The need to take medication as prescribed, test blood sugar as directed, and to call between visits if there is a concern that blood sugar is uncontrolled is also discussed.   [name] is reminded of the importance of daily foot exam, annual eye examination, and good blood sugar, blood pressure and cholesterol control.  Diabetic Labs Latest Ref Rng & Units 07/05/2017 07/03/2017 04/17/2017 04/13/2017 04/12/2017  HbA1c <5.7 % 6.4(H) - - - -  Microalbumin Not Estab. ug/mL - - <3.0(H) - -  Micro/Creat Ratio 0.0 - 30.0 mg/g creat - - <4.1 - -  Chol <200 mg/dL 164 - - - -  HDL >40 mg/dL 36(L) - - - -  Calc LDL <100 mg/dL 91 - - - -  Triglycerides <150 mg/dL 183(H) - - - -  Creatinine 0.70 - 1.25 mg/dL 1.60(H) 1.59(H) - 1.53(H) 1.54(H)   BP/Weight 12/04/2017 08/01/2017 07/03/2017 05/09/2017 04/17/2017 04/13/2017 0/12/7492  Systolic BP 496 759 163 846 659 935 701  Diastolic BP 78 76 72 80 82 69 83  Wt. (Lbs) 240 235.75 230 234.08 235 234.1 221.78  BMI 31.66 31.1 30.34 30.88 31 30.89 -  Some encounter information is confidential and restricted. Go to Review Flowsheets activity to see all data.   Foot/eye exam completion dates Latest Ref Rng & Units 07/10/2016 08/30/2015  Eye Exam No Retinopathy - -  Foot Form Completion - Done Done

## 2017-12-05 LAB — BASIC METABOLIC PANEL WITH GFR
BUN/Creatinine Ratio: 8 (calc) (ref 6–22)
BUN: 11 mg/dL (ref 7–25)
CO2: 29 mmol/L (ref 20–32)
Calcium: 10 mg/dL (ref 8.6–10.3)
Chloride: 98 mmol/L (ref 98–110)
Creat: 1.41 mg/dL — ABNORMAL HIGH (ref 0.70–1.25)
GFR, Est African American: 59 mL/min/{1.73_m2} — ABNORMAL LOW (ref >=60)
GFR, Est Non African American: 51 mL/min/{1.73_m2} — ABNORMAL LOW (ref >=60)
Glucose, Bld: 134 mg/dL (ref 65–139)
Potassium: 3.9 mmol/L (ref 3.5–5.3)
Sodium: 133 mmol/L — ABNORMAL LOW (ref 135–146)

## 2017-12-05 LAB — HEMOGLOBIN A1C
Hgb A1c MFr Bld: 6.8 % of total Hgb — ABNORMAL HIGH (ref <5.7)
Mean Plasma Glucose: 148 (calc)
eAG (mmol/L): 8.2 (calc)

## 2017-12-09 ENCOUNTER — Encounter: Payer: Self-pay | Admitting: Family Medicine

## 2017-12-09 DIAGNOSIS — L089 Local infection of the skin and subcutaneous tissue, unspecified: Secondary | ICD-10-CM | POA: Insufficient documentation

## 2017-12-09 MED ORDER — DOXYCYCLINE HYCLATE 100 MG PO TABS: 100.0000 mg | ORAL_TABLET | Freq: Two times a day (BID) | ORAL | 0 refills | Status: DC

## 2017-12-09 NOTE — Assessment & Plan Note (Signed)
Infected cyst on scalp, doxycycline prescribed x 1 week

## 2017-12-09 NOTE — Progress Notes (Signed)
Matthew Molina     MRN: [id number]      DOB: [birth year]   HPI: Patient is in for annual physical exam. C/o cyst on back of head after he was being shaved he developed a boil in the area Recent labs, if available are reviewed. Immunization is reviewed , and  updated if needed.    PE; BP 118/78   Pulse (!) 102   Resp 16   Ht 6\' 1"  (1.854 m)   Wt 240 lb (108.9 kg)   SpO2 94%   BMI 31.66 kg/m   Pleasant male, alert and oriented x 3, in no cardio-pulmonary distress. Afebrile. HEENT No facial trauma or asymetry. Sinuses non tender. EOMI, . External ears normal, tympanic membranes clear. Oropharynx moist, no exudate. Neck: supple, right posterior cervical adenitis ,JVD or thyromegaly.No bruits.  Chest: Clear to ascultation bilaterally.No crackles or wheezes.Decreased air entry Non tender to palpation  Breast: No asymetry,no masses. No nipple discharge or inversion. No axillary or supraclavicular adenopathy  Cardiovascular system; Heart sounds normal,  S1 and  S2 ,no S3.  No murmur, or thrill. Apical beat not displaced Peripheral pulses normal.  Abdomen: Soft, non tender, no organomegaly or masses. No bruits. Bowel sounds normal. No guarding, tenderness or rebound.  Rectal:  Normal sphincter tone. No hemorrhoids or  masses. guaiac negative stool. Prostate smooth and firm    Musculoskeletal exam: Decreased  ROM of spine, hips , shoulders and knees. No deformity ,swelling or crepitus noted. No muscle wasting or atrophy.   Neurologic: Cranial nerves 2 to 12 intact. Power, tone ,sensation and reflexes normal throughout. No disturbance in gait. No tremor.  Skin: Skin infection noted on posterior scalp where pt had direct trauma while having his hair shaved. Pigmentation normal throughout  Psych; Normal mood and affect. Judgement and concentration normal   Assessment & Plan:  Annual physical exam Annual exam as documented. Counseling done  re healthy  lifestyle involving commitment to 150 minutes exercise per week, heart healthy diet, and attaining healthy weight.The importance of adequate sleep also discussed. Regular seat belt use and home safety, is also discussed. Changes in health habits are decided on by the patient with goals and time frames  set for achieving them. Immunization and cancer screening needs are specifically addressed at this visit.   Tobacco abuse Detriorated up to 30/day Patient is asked and  confirms current  Nicotine use.  Five to seven minutes of time is spent in counseling the patient of the need to quit smoking  Advice to quit is delivered clearly specifically in reducing the risk of developing heart disease, having a stroke, or of developing all types of cancer, especially lung and oral cancer. Improvement in breathing and exercise tolerance and quality of life is also discussed, as is the economic benefit.  Assessment of willingness to quit or to make an attempt to quit is made and documented  Assistance in quit attempt is made with several and varied options presented, based on patient's desire and need. These include  literature, local classes available, 1800 QUIT NOW number, OTC and prescription medication.  The GOAL to be NICOTINE FREE is re emphasized.  The patient has set a personal goal of either reduction or discontinuation and follow up is arranged between 6 an 16 weeks.    Diabetes mellitus type 2 in obese [facility]) Updated lab needed at/ before next visit. Matthew Molina is reminded of the importance of commitment to daily physical activity for 30 minutes or  more, as able and the need to limit carbohydrate intake to 30 to 60 grams per meal to help with blood sugar control.   The need to take medication as prescribed, test blood sugar as directed, and to call between visits if there is a concern that blood sugar is uncontrolled is also discussed.   Matthew Molina is reminded of the importance of daily foot  exam, annual eye examination, and good blood sugar, blood pressure and cholesterol control.  Diabetic Labs Latest Ref Rng & Units 07/05/2017 07/03/2017 04/17/2017 04/13/2017 04/12/2017  HbA1c <5.7 % 6.4(H) - - - -  Microalbumin Not Estab. ug/mL - - <3.0(H) - -  Micro/Creat Ratio 0.0 - 30.0 mg/g creat - - <4.1 - -  Chol <200 mg/dL 164 - - - -  HDL >40 mg/dL 36(L) - - - -  Calc LDL <100 mg/dL 91 - - - -  Triglycerides <150 mg/dL 183(H) - - - -  Creatinine 0.70 - 1.25 mg/dL 1.60(H) 1.59(H) - 1.53(H) 1.54(H)   BP/Weight 12/04/2017 08/01/2017 07/03/2017 05/09/2017 04/17/2017 04/13/2017 2/58/5277  Systolic BP 824 235 361 443 154 008 676  Diastolic BP 78 76 72 80 82 69 83  Wt. (Lbs) 240 235.75 230 234.08 235 234.1 221.78  BMI 31.66 31.1 30.34 30.88 31 30.89 -  Some encounter information is confidential and restricted. Go to Review Flowsheets activity to see all data.   Foot/eye exam completion dates Latest Ref Rng & Units 07/10/2016 08/30/2015  Eye Exam No Retinopathy - -  Foot Form Completion - Done Done        Coronary atherosclerosis of native coronary artery Abnormal chest scan with multiple cVB risk factors, refer to cardiology for further evaluation  Skin infection Infected cyst on scalp, doxycycline prescribed x 1 week

## 2017-12-20 ENCOUNTER — Other Ambulatory Visit: Payer: Self-pay | Admitting: Family Medicine

## 2017-12-21 ENCOUNTER — Ambulatory Visit (INDEPENDENT_AMBULATORY_CARE_PROVIDER_SITE_OTHER): Payer: Medicare Other | Admitting: Urology

## 2017-12-21 ENCOUNTER — Other Ambulatory Visit: Payer: Self-pay | Admitting: Urology

## 2017-12-21 DIAGNOSIS — R972 Elevated prostate specific antigen [PSA]: Secondary | ICD-10-CM | POA: Diagnosis not present

## 2017-12-21 DIAGNOSIS — N400 Enlarged prostate without lower urinary tract symptoms: Secondary | ICD-10-CM

## 2017-12-25 ENCOUNTER — Telehealth: Payer: Self-pay

## 2017-12-25 ENCOUNTER — Other Ambulatory Visit: Payer: Self-pay

## 2017-12-25 ENCOUNTER — Encounter: Payer: Self-pay | Admitting: Cardiology

## 2017-12-25 MED ORDER — RISPERIDONE 4 MG PO TABS: ORAL_TABLET | ORAL | 1 refills | Status: DC

## 2017-12-25 NOTE — Telephone Encounter (Signed)
Med sent in.

## 2017-12-25 NOTE — Telephone Encounter (Signed)
Pt needs "Rispidone"  4 mg RX called in to pharmacy.

## 2017-12-26 ENCOUNTER — Other Ambulatory Visit: Payer: Self-pay | Admitting: Family Medicine

## 2017-12-28 ENCOUNTER — Other Ambulatory Visit: Payer: Self-pay

## 2017-12-28 ENCOUNTER — Telehealth: Payer: Self-pay | Admitting: Family Medicine

## 2017-12-28 MED ORDER — BLOOD GLUCOSE METER KIT: PACK | 0 refills | Status: DC

## 2017-12-28 NOTE — Telephone Encounter (Signed)
Patient is requesting [facility] Cb#: [phone]

## 2017-12-28 NOTE — Telephone Encounter (Signed)
Sent in

## 2018-01-01 DIAGNOSIS — J449 Chronic obstructive pulmonary disease, unspecified: Secondary | ICD-10-CM | POA: Diagnosis not present

## 2018-01-10 DIAGNOSIS — R972 Elevated prostate specific antigen [PSA]: Secondary | ICD-10-CM | POA: Diagnosis not present

## 2018-01-11 ENCOUNTER — Ambulatory Visit (HOSPITAL_COMMUNITY)
Admission: RE | Admit: 2018-01-11 | Discharge: 2018-01-11 | Disposition: A | Discharge status: Home / Self Care | Payer: Medicare Other | Source: Ambulatory Visit | Attending: Urology | Admitting: Urology

## 2018-01-11 ENCOUNTER — Encounter (HOSPITAL_COMMUNITY): Payer: Self-pay

## 2018-01-11 DIAGNOSIS — R972 Elevated prostate specific antigen [PSA]: Secondary | ICD-10-CM | POA: Insufficient documentation

## 2018-01-11 MED ORDER — CEFTRIAXONE SODIUM 1 G IJ SOLR
1.0000 g | Freq: Once | INTRAMUSCULAR | Status: AC
  Administered 2018-01-11: 1 g via INTRAMUSCULAR

## 2018-01-11 MED ORDER — LIDOCAINE HCL (PF) 2 % IJ SOLN
INTRAMUSCULAR | Status: AC
  Administered 2018-01-11: 10 mL
  Filled 2018-01-11: qty 10

## 2018-01-11 MED ORDER — LIDOCAINE HCL (PF) 1 % IJ SOLN
INTRAMUSCULAR | Status: AC
  Administered 2018-01-11: 2.1 mL
  Filled 2018-01-11: qty 5

## 2018-01-11 MED ORDER — CEFTRIAXONE SODIUM 1 G IJ SOLR
INTRAMUSCULAR | Status: AC
  Administered 2018-01-11: 1 g via INTRAMUSCULAR
  Filled 2018-01-11: qty 10

## 2018-01-11 NOTE — Discharge Instructions (Signed)
Transrectal Ultrasound-Guided Biopsy °A transrectal ultrasound-guided biopsy is a procedure to remove samples of tissue from your prostate using ultrasound images to guide the procedure. The procedure is usually done to evaluate the prostate gland of men who have an elevated prostate-specific antigen (PSA). PSA is a blood test to screen for prostate cancer. The biopsy samples are taken to check for prostate cancer. °Tell a health care provider about: °· Any allergies you have. °· All medicines you are taking, including vitamins, herbs, eye drops, creams, and over-the-counter medicines. °· Any problems you or family members have had with anesthetic medicines. °· Any blood disorders you have. °· Any surgeries you have had. °· Any medical conditions you have. °What are the risks? °Generally, this is a safe procedure. However, as with any procedure, problems can occur. Possible problems include: °· Infection of your prostate. °· Bleeding from your rectum or blood in your urine. °· Difficulty urinating. °· Nerve damage (this is usually temporary). °· Damage to surrounding structures such as blood vessels, organs, and muscles, which would require other procedures. ° °What happens before the procedure? °· Do not eat or drink anything after midnight on the night before the procedure or as directed by your health care provider. °· Take medicines only as directed by your health care provider. °· Your health care provider may have you stop taking certain medicines 5-7 days before the procedure. °· You will be given an enema before the procedure. During an enema, a liquid is injected into your rectum to clear out waste. °· You may have lab tests the day of your procedure. °· Plan to have someone take you home after the procedure. °What happens during the procedure? °· You will be given medicine to help you relax (sedative) before the procedure. An IV tube will be inserted into one of your veins and used to give fluids and  medicine. °· You will be given antibiotic medicine to reduce the risk of an infection. °· You will be placed on your side for the procedure. °· A probe with lubricated gel will be placed into your rectum, and images will be taken of your prostate and surrounding structures. °· Numbing medicine will be injected into the prostate before the biopsy samples are taken. °· A biopsy needle will then be inserted and guided to your prostate with the use of the ultrasound images. °· Samples of prostate tissue will be taken, and the needle will then be removed. °· The biopsy samples will be sent to a lab to be analyzed. Results are usually back in 2-3 days. °What happens after the procedure? °· You will be taken to a recovery area where you will be monitored. °· You may have some discomfort in the rectal area. You will be given pain medicines to control this. °· You may be allowed to go home the same day, or you may need to stay in the hospital overnight. °This information is not intended to replace advice given to you by your health care provider. Make sure you discuss any questions you have with your health care provider. °Document Released: 04/20/2014 Document Revised: 05/11/2016 Document Reviewed: 07/23/2013 °Elsevier Interactive Patient Education © 2018 [employer] ° °

## 2018-01-18 ENCOUNTER — Other Ambulatory Visit: Payer: Self-pay | Admitting: Family Medicine

## 2018-01-28 ENCOUNTER — Ambulatory Visit (INDEPENDENT_AMBULATORY_CARE_PROVIDER_SITE_OTHER): Payer: Medicare Other | Admitting: Cardiovascular Disease

## 2018-01-28 ENCOUNTER — Encounter: Payer: Self-pay | Admitting: Cardiovascular Disease

## 2018-01-28 VITALS — BP 118/74 | HR 100 | Ht 73.0 in | Wt 238.0 lb

## 2018-01-28 DIAGNOSIS — I2510 Atherosclerotic heart disease of native coronary artery without angina pectoris: Secondary | ICD-10-CM | POA: Diagnosis not present

## 2018-01-28 DIAGNOSIS — I10 Essential (primary) hypertension: Secondary | ICD-10-CM

## 2018-01-28 DIAGNOSIS — E785 Hyperlipidemia, unspecified: Secondary | ICD-10-CM

## 2018-01-28 DIAGNOSIS — R931 Abnormal findings on diagnostic imaging of heart and coronary circulation: Secondary | ICD-10-CM | POA: Diagnosis not present

## 2018-01-28 DIAGNOSIS — J449 Chronic obstructive pulmonary disease, unspecified: Secondary | ICD-10-CM

## 2018-01-28 DIAGNOSIS — Z720 Tobacco use: Secondary | ICD-10-CM | POA: Diagnosis not present

## 2018-01-28 NOTE — Progress Notes (Signed)
CARDIOLOGY CONSULT NOTE  Patient ID: Matthew Molina MRN: [id number] DOB/AGE: [birth year] 67 y.o.  Admit date: (Not on file) Primary Physician: Fayrene Helper, MD Referring Physician: [name]  Reason for Consultation: Abnormal CT scan  HPI: Matthew Molina is a 67 y.o. male who is being seen today for the evaluation of abnormal CT scan at the request of Fayrene Helper, MD.   Past medical history includes type 2 diabetes mellitus, hypertension, tobacco abuse, and hyperlipidemia.  Low-dose lung cancer screening CT on 01/11/17 demonstrated aortic atherosclerosis as well as atherosclerosis of the great vessels of the mediastinum and the coronary arteries, including calcified athero-cirrhotic plaque in the left main, left anterior descending, left circumflex, and right coronary arteries.  Lipid panel 07/05/17 showed total cholesterol 164, triglycerides 183, HDL 36, LDL 91.  He denies exertional chest pain.  He does have some chronic exertional dyspnea which appears to be stable.  He denies palpitations, orthopnea, and paroxysmal nocturnal dyspnea.  He has been smoking 1-1/2 packs to 2 packs of cigarettes daily for about 50 years.  He underwent a normal nuclear stress test on 04/23/2015.  It appears this was also performed for coronary artery calcifications seen on a CT scan.  I personally reviewed the ECG performed on 04/11/17 which demonstrated sinus tachycardia, 115 bpm, with nonspecific inferior Q waves.  Family history: One sister underwent coronary artery stent placement in her late 20s.   Allergies  Allergen Reactions  . Sertraline Hcl     Stomach upset/pain  . Wellbutrin [Bupropion] Other (See Comments)    Makes stomach hurt    Current Outpatient Medications  Medication Sig Dispense Refill  . ACCU-CHEK AVIVA PLUS test strip USE TO TEST ONCE DAILY 100 each 1  . ACCU-CHEK SOFTCLIX LANCETS lancets TEST BLOOD SUGAR ONCE DAILY AS DIRECTED. 100 each 2  .  acetaminophen (TYLENOL) 500 MG tablet Take 500 mg by mouth daily as needed for moderate pain.     Marland Kitchen albuterol (PROVENTIL HFA;VENTOLIN HFA) 108 (90 BASE) MCG/ACT inhaler Inhale 2 puffs into the lungs every 6 (six) hours as needed for wheezing or shortness of breath. 1 Inhaler 2  . aspirin EC 81 MG tablet Take 81 mg by mouth daily.    . blood glucose meter kit and supplies Dispense based on patient and insurance preference.Requesting accuchek meter. Once daily testing dx e11.9 1 each 0  . busPIRone (BUSPAR) 7.5 MG tablet TAKE 1 TABLET BY MOUTH 3 TIMES DAILY. 90 tablet 0  . docusate sodium (COLACE) 100 MG capsule Take 100 mg by mouth daily.    Marland Kitchen doxycycline (VIBRA-TABS) 100 MG tablet Take 1 tablet (100 mg total) by mouth 2 (two) times daily. 14 tablet 0  . glipiZIDE (GLIPIZIDE XL) 5 MG 24 hr tablet Take 1 tablet (5 mg total) by mouth daily with breakfast. 90 tablet 1  . ipratropium-albuterol (DUONEB) 0.5-2.5 (3) MG/3ML SOLN INHALE 1 VIAL VIA NEBULIZER EVERY FOUR HOURS AS NEEDED. 360 mL 2  . olmesartan (BENICAR) 40 MG tablet Take 1 tablet (40 mg total) by mouth daily. 30 tablet 5  . polyethylene glycol powder (GLYCOLAX/MIRALAX) powder MIX 1 CAPFUL (17G) IN 8 OUNCES OF JUICE/WATER AND DRINK ONCE DAILY. (Patient taking differently: MIX 1 CAPFUL (17G) IN 8 OUNCES OF JUICE/WATER AND DRINK ONCE DAILY AS NEEDED) 527 g 3  . pravastatin (PRAVACHOL) 20 MG tablet TAKE (1) TABLET BY MOUTH AT BEDTIME FOR CHOLESTEROL. 30 tablet 5  . predniSONE (STERAPRED UNI-PAK  21 TAB) 5 MG (21) TBPK tablet Take 1 tablet (5 mg total) by mouth as directed. Use as directed 21 tablet 0  . risperiDONE (RISPERDAL) 1 MG tablet TAKE (1) TABLET BY MOUTH AT BEDTIME WITH '4MG'$ . 30 tablet 0  . risperidone (RISPERDAL) 4 MG tablet TAKE (1) TABLET BY MOUTH AT BEDTIME. 90 tablet 0  . SYMBICORT 80-4.5 MCG/ACT inhaler INHALE 2 PUFFS INTO LUNGS TWICE DAILY. 10.2 g 0  . tiotropium (SPIRIVA) 18 MCG inhalation capsule Place 1 capsule (18 mcg total) into  inhaler and inhale daily. 30 capsule 12  . traZODone (DESYREL) 50 MG tablet TAKE ONE TABLET BY MOUTH AT BEDTIME AS NEEDED FOR SLEEP. 90 tablet 0  . triamterene-hydrochlorothiazide (MAXZIDE-25) 37.5-25 MG tablet TAKE (1/2) TABLET BY MOUTH ONCE DAILY. 45 tablet 1   No current facility-administered medications for this visit.     Past Medical History:  Diagnosis Date  . Acute kidney injury ([place])   . Acute respiratory failure with hypoxia ([place])   . Arthritis   . COPD (chronic obstructive pulmonary disease) ([place])   . Depression   . Diabetes mellitus   . Diabetes mellitus without complication ([place])   . Hypercholesterolemia   . Hyperlipidemia   . Hypertension   . Multiple lung nodules on CT 04/02/2015  . Nicotine addiction   . Obesity   . Oxygen deficiency    qhs  . Schizophrenia [facility])     Past Surgical History:  Procedure Laterality Date  . CATARACT EXTRACTION W/PHACO Left 11/20/2013   Procedure: CATARACT EXTRACTION PHACO AND INTRAOCULAR LENS PLACEMENT (IOC);  Surgeon: Tonny Branch, MD;  Location: [facility];  Service: Ophthalmology;  Laterality: Left;  CDE:10.26  . CATARACT EXTRACTION W/PHACO Right 12/08/2013   Procedure: RIGHT EYE CATARACT EXTRACTION PHACO AND INTRAOCULAR LENS PLACEMENT ;  Surgeon: Tonny Branch, MD;  Location: [facility];  Service: Ophthalmology;  Laterality: Right;  CDE 12.38  . COLONOSCOPY N/A 04/01/2013   Procedure: COLONOSCOPY;  Surgeon: Danie Binder, MD;  Location: [facility];  Service: Endoscopy;  Laterality: N/A;  10:00 AM-moved to [place] notified pt  . COLONOSCOPY  2014   INCOMPLETE PREP IN R COLON  . ESOPHAGOGASTRODUODENOSCOPY N/A 02/18/2016   Procedure: ESOPHAGOGASTRODUODENOSCOPY (EGD);  Surgeon: Danie Binder, MD;  Location: [facility];  Service: Endoscopy;  Laterality: N/A;  1145  . EYE SURGERY Left 11/2013   cataract extraction  . SAVORY DILATION N/A 02/18/2016   Procedure: SAVORY DILATION;  Surgeon: Danie Binder, MD;  Location: [facility];   Service: Endoscopy;  Laterality: N/A;  . SHOULDER SURGERY      Social History   Socioeconomic History  . Marital status: Single    Spouse name: Not on file  . Number of children: 2  . Years of education: Not on file  . Highest education level: Not on file  Social Needs  . Financial resource strain: Not on file  . Food insecurity - worry: Not on file  . Food insecurity - inability: Not on file  . Transportation needs - medical: Not on file  . Transportation needs - non-medical: Not on file  Occupational History  . Occupation: retired from [employer]  . Smoking status: Current Every Day Smoker    Packs/day: 1.00    Years: 48.00    Pack years: 48.00    Types: Cigarettes    Last attempt to quit: 05/02/2017    Years since quitting: 0.7  . Smokeless tobacco: Never  Used  . Tobacco comment: quit a week ago  Substance and Sexual Activity  . Alcohol use: No    Alcohol/week: 0.0 oz  . Drug use: No  . Sexual activity: Not Currently    Birth control/protection: None  Other Topics Concern  . Not on file  Social History Narrative   ** Merged History Encounter **       ** Merged History Encounter **          Current Meds  Medication Sig  . ACCU-CHEK AVIVA PLUS test strip USE TO TEST ONCE DAILY  . ACCU-CHEK SOFTCLIX LANCETS lancets TEST BLOOD SUGAR ONCE DAILY AS DIRECTED.  Marland Kitchen acetaminophen (TYLENOL) 500 MG tablet Take 500 mg by mouth daily as needed for moderate pain.   Marland Kitchen albuterol (PROVENTIL HFA;VENTOLIN HFA) 108 (90 BASE) MCG/ACT inhaler Inhale 2 puffs into the lungs every 6 (six) hours as needed for wheezing or shortness of breath.  Marland Kitchen aspirin EC 81 MG tablet Take 81 mg by mouth daily.  . blood glucose meter kit and supplies Dispense based on patient and insurance preference.Requesting accuchek meter. Once daily testing dx e11.9  . busPIRone (BUSPAR) 7.5 MG tablet TAKE 1 TABLET BY MOUTH 3 TIMES DAILY.  Marland Kitchen docusate sodium (COLACE) 100 MG capsule Take 100 mg by mouth  daily.  Marland Kitchen doxycycline (VIBRA-TABS) 100 MG tablet Take 1 tablet (100 mg total) by mouth 2 (two) times daily.  Marland Kitchen glipiZIDE (GLIPIZIDE XL) 5 MG 24 hr tablet Take 1 tablet (5 mg total) by mouth daily with breakfast.  . ipratropium-albuterol (DUONEB) 0.5-2.5 (3) MG/3ML SOLN INHALE 1 VIAL VIA NEBULIZER EVERY FOUR HOURS AS NEEDED.  Marland Kitchen olmesartan (BENICAR) 40 MG tablet Take 1 tablet (40 mg total) by mouth daily.  . polyethylene glycol powder (GLYCOLAX/MIRALAX) powder MIX 1 CAPFUL (17G) IN 8 OUNCES OF JUICE/WATER AND DRINK ONCE DAILY. (Patient taking differently: MIX 1 CAPFUL (17G) IN 8 OUNCES OF JUICE/WATER AND DRINK ONCE DAILY AS NEEDED)  . pravastatin (PRAVACHOL) 20 MG tablet TAKE (1) TABLET BY MOUTH AT BEDTIME FOR CHOLESTEROL.  Marland Kitchen predniSONE (STERAPRED UNI-PAK 21 TAB) 5 MG (21) TBPK tablet Take 1 tablet (5 mg total) by mouth as directed. Use as directed  . risperiDONE (RISPERDAL) 1 MG tablet TAKE (1) TABLET BY MOUTH AT BEDTIME WITH '4MG'$ .  . risperidone (RISPERDAL) 4 MG tablet TAKE (1) TABLET BY MOUTH AT BEDTIME.  . SYMBICORT 80-4.5 MCG/ACT inhaler INHALE 2 PUFFS INTO LUNGS TWICE DAILY.  Marland Kitchen tiotropium (SPIRIVA) 18 MCG inhalation capsule Place 1 capsule (18 mcg total) into inhaler and inhale daily.  . traZODone (DESYREL) 50 MG tablet TAKE ONE TABLET BY MOUTH AT BEDTIME AS NEEDED FOR SLEEP.  Marland Kitchen triamterene-hydrochlorothiazide (MAXZIDE-25) 37.5-25 MG tablet TAKE (1/2) TABLET BY MOUTH ONCE DAILY.      Review of systems complete and found to be negative unless listed above in HPI    Physical exam Blood pressure 118/74, pulse 100, height '6\' 1"'$  (1.854 m), weight 238 lb (108 kg), SpO2 95 %. General: NAD Neck: No JVD, no thyromegaly or thyroid nodule.  Lungs: Diminished sounds throughout, no crackles or wheezes. CV: Nondisplaced PMI. Regular rate and rhythm, normal S1/S2, no S3/S4, no murmur.  No peripheral edema.  No carotid bruit.    Abdomen: Soft, nontender, no distention.  Skin: Intact without lesions or  rashes.  Neurologic: Alert and oriented x 3.  Psych: Normal affect. Extremities: No clubbing or cyanosis.  HEENT: Normal.   ECG: Most recent ECG reviewed.   Labs: Lab  Results  Component Value Date/Time   K 3.9 12/04/2017 12:07 PM   BUN 11 12/04/2017 12:07 PM   CREATININE 1.41 (H) 12/04/2017 12:07 PM   ALT 31 07/05/2017 07:42 AM   TSH 0.868 04/11/2017 01:16 PM   TSH 2.11 06/05/2016 08:21 AM   HGB 13.8 07/03/2017 08:03 AM     Lipids: Lab Results  Component Value Date/Time   LDLCALC 91 07/05/2017 07:42 AM   CHOL 164 07/05/2017 07:42 AM   TRIG 183 (H) 07/05/2017 07:42 AM   HDL 36 (L) 07/05/2017 07:42 AM        ASSESSMENT AND PLAN:  1.  Coronary artery calcifications on CT scan: He has multiple cardiovascular risk factors as noted above.  Chronic exertional dyspnea appears stable and may be due to COPD. He walks with a cane.  I will proceed with a nuclear myocardial perfusion imaging study to evaluate for ischemic heart disease (Lexiscan Myoview).  2.  Chronic hypertension: Blood pressure is controlled on present therapy.  No changes.  3.  Hyperlipidemia: Lipids reviewed above.  Continue statin therapy.  4.  Tobacco abuse disorder  5.  COPD: Symptoms appear to be relatively stable.    Disposition: Follow up to be determined.   Signed: Kate Sable, M.D., F.A.C.C.  01/28/2018, 1:40 PM

## 2018-01-28 NOTE — Patient Instructions (Addendum)
Your physician wants you to follow-up in: to be determined, we will call you with test results   Your physician has requested that you have a lexiscan myoview. For further information please visit [email or web address]. Please follow instruction sheet, as given.    Your physician recommends that you continue on your current medications as directed. Please refer to the Current Medication list given to you today.     If you need a refill on your cardiac medications before your next appointment, please call your pharmacy.      No lab work today.    Thank you for choosing [facility] !

## 2018-01-30 ENCOUNTER — Other Ambulatory Visit: Payer: Self-pay | Admitting: Family Medicine

## 2018-01-31 ENCOUNTER — Other Ambulatory Visit: Payer: Self-pay

## 2018-01-31 ENCOUNTER — Telehealth: Payer: Self-pay

## 2018-01-31 NOTE — Telephone Encounter (Signed)
reqesting a refill of prednisone for cough. Advised he would need to be seen by another provider since Dr Moshe Cipro was unavailable. He was advised to call back if he felt his symptoms were worsening

## 2018-02-01 DIAGNOSIS — J449 Chronic obstructive pulmonary disease, unspecified: Secondary | ICD-10-CM | POA: Diagnosis not present

## 2018-02-11 ENCOUNTER — Encounter (HOSPITAL_BASED_OUTPATIENT_CLINIC_OR_DEPARTMENT_OTHER)
Admission: RE | Admit: 2018-02-11 | Discharge: 2018-02-11 | Disposition: A | Discharge status: Home / Self Care | Payer: Medicare Other | Source: Ambulatory Visit | Attending: Cardiovascular Disease | Admitting: Cardiovascular Disease

## 2018-02-11 ENCOUNTER — Encounter (HOSPITAL_COMMUNITY): Payer: Self-pay

## 2018-02-11 ENCOUNTER — Encounter (HOSPITAL_COMMUNITY)
Admission: RE | Admit: 2018-02-11 | Discharge: 2018-02-11 | Disposition: A | Discharge status: Home / Self Care | Payer: Medicare Other | Source: Ambulatory Visit | Attending: Cardiovascular Disease | Admitting: Cardiovascular Disease

## 2018-02-11 DIAGNOSIS — R931 Abnormal findings on diagnostic imaging of heart and coronary circulation: Secondary | ICD-10-CM

## 2018-02-11 LAB — NM MYOCAR MULTI W/SPECT W/WALL MOTION / EF
LV dias vol: 59 mL (ref 62–150)
LV sys vol: 21 mL
Peak HR: 104 {beats}/min
RATE: 0.35
Rest HR: 82 {beats}/min
SDS: 3
SRS: 0
SSS: 3
TID: 0.89

## 2018-02-11 MED ORDER — TECHNETIUM TC 99M TETROFOSMIN IV KIT
10.0000 | PACK | Freq: Once | INTRAVENOUS | Status: AC | PRN
  Administered 2018-02-11: 11 via INTRAVENOUS

## 2018-02-11 MED ORDER — SODIUM CHLORIDE 0.9% FLUSH
INTRAVENOUS | Status: AC
  Administered 2018-02-11: 10 mL via INTRAVENOUS
  Filled 2018-02-11: qty 10

## 2018-02-11 MED ORDER — TECHNETIUM TC 99M TETROFOSMIN IV KIT
30.0000 | PACK | Freq: Once | INTRAVENOUS | Status: AC | PRN
  Administered 2018-02-11: 33 via INTRAVENOUS

## 2018-02-11 MED ORDER — REGADENOSON 0.4 MG/5ML IV SOLN
INTRAVENOUS | Status: AC
  Administered 2018-02-11: 0.4 mg via INTRAVENOUS
  Filled 2018-02-11: qty 5

## 2018-02-15 ENCOUNTER — Other Ambulatory Visit: Payer: Self-pay | Admitting: Family Medicine

## 2018-02-19 ENCOUNTER — Telehealth: Payer: Self-pay | Admitting: Family Medicine

## 2018-02-19 ENCOUNTER — Other Ambulatory Visit: Payer: Self-pay | Admitting: Family Medicine

## 2018-02-19 MED ORDER — BENZONATATE 100 MG PO CAPS: 100.0000 mg | ORAL_CAPSULE | Freq: Two times a day (BID) | ORAL | 0 refills | Status: DC | PRN

## 2018-02-19 MED ORDER — AZITHROMYCIN 250 MG PO TABS: ORAL_TABLET | ORAL | 0 refills | Status: DC

## 2018-02-19 NOTE — Telephone Encounter (Signed)
PT is calling in, he is congested in his head and chest, coughing up Brown Mucus. Started a week ago, and is getting worse. Wants something called in, I offered an appt, he has no transportation, [facility] needs 3 days prior.

## 2018-02-19 NOTE — Progress Notes (Signed)
Tessalon perle and Z pack are prescribed

## 2018-02-19 NOTE — Telephone Encounter (Signed)
Patient aware.

## 2018-02-19 NOTE — Telephone Encounter (Signed)
I am sending in a Z pack and  Tessalon perle

## 2018-02-26 ENCOUNTER — Other Ambulatory Visit: Payer: Self-pay

## 2018-02-26 ENCOUNTER — Encounter (HOSPITAL_COMMUNITY): Payer: Self-pay | Admitting: Emergency Medicine

## 2018-02-26 ENCOUNTER — Emergency Department (HOSPITAL_COMMUNITY): Payer: Medicare Other

## 2018-02-26 ENCOUNTER — Emergency Department (HOSPITAL_COMMUNITY)
Admission: EM | Admit: 2018-02-26 | Discharge: 2018-02-27 | Disposition: A | Discharge status: Home / Self Care | Payer: Medicare Other | Attending: Emergency Medicine | Admitting: Emergency Medicine

## 2018-02-26 ENCOUNTER — Telehealth: Payer: Self-pay | Admitting: Family Medicine

## 2018-02-26 DIAGNOSIS — I2510 Atherosclerotic heart disease of native coronary artery without angina pectoris: Secondary | ICD-10-CM | POA: Insufficient documentation

## 2018-02-26 DIAGNOSIS — E1122 Type 2 diabetes mellitus with diabetic chronic kidney disease: Secondary | ICD-10-CM | POA: Insufficient documentation

## 2018-02-26 DIAGNOSIS — R000 Tachycardia, unspecified: Secondary | ICD-10-CM | POA: Insufficient documentation

## 2018-02-26 DIAGNOSIS — I129 Hypertensive chronic kidney disease with stage 1 through stage 4 chronic kidney disease, or unspecified chronic kidney disease: Secondary | ICD-10-CM | POA: Diagnosis not present

## 2018-02-26 DIAGNOSIS — Z79899 Other long term (current) drug therapy: Secondary | ICD-10-CM | POA: Diagnosis not present

## 2018-02-26 DIAGNOSIS — J441 Chronic obstructive pulmonary disease with (acute) exacerbation: Secondary | ICD-10-CM

## 2018-02-26 DIAGNOSIS — N183 Chronic kidney disease, stage 3 (moderate): Secondary | ICD-10-CM | POA: Diagnosis not present

## 2018-02-26 DIAGNOSIS — R05 Cough: Secondary | ICD-10-CM | POA: Insufficient documentation

## 2018-02-26 DIAGNOSIS — Z7984 Long term (current) use of oral hypoglycemic drugs: Secondary | ICD-10-CM | POA: Diagnosis not present

## 2018-02-26 DIAGNOSIS — R0602 Shortness of breath: Secondary | ICD-10-CM | POA: Diagnosis not present

## 2018-02-26 DIAGNOSIS — F17210 Nicotine dependence, cigarettes, uncomplicated: Secondary | ICD-10-CM | POA: Diagnosis not present

## 2018-02-26 DIAGNOSIS — R062 Wheezing: Secondary | ICD-10-CM | POA: Diagnosis not present

## 2018-02-26 DIAGNOSIS — Z7982 Long term (current) use of aspirin: Secondary | ICD-10-CM | POA: Insufficient documentation

## 2018-02-26 DIAGNOSIS — R0682 Tachypnea, not elsewhere classified: Secondary | ICD-10-CM | POA: Diagnosis not present

## 2018-02-26 LAB — COMPREHENSIVE METABOLIC PANEL
ALT: 34 U/L (ref 17–63)
AST: 34 U/L (ref 15–41)
Albumin: 3.8 g/dL (ref 3.5–5.0)
Alkaline Phosphatase: 49 U/L (ref 38–126)
Anion gap: 10 (ref 5–15)
BUN: 10 mg/dL (ref 6–20)
CO2: 24 mmol/L (ref 22–32)
Calcium: 9.5 mg/dL (ref 8.9–10.3)
Chloride: 97 mmol/L — ABNORMAL LOW (ref 101–111)
Creatinine, Ser: 1.32 mg/dL — ABNORMAL HIGH (ref 0.61–1.24)
GFR calc Af Amer: >60 mL/min (ref >60)
GFR calc non Af Amer: 54 mL/min — ABNORMAL LOW (ref >60)
Glucose, Bld: 92 mg/dL (ref 65–99)
Potassium: 3.7 mmol/L (ref 3.5–5.1)
Sodium: 131 mmol/L — ABNORMAL LOW (ref 135–145)
Total Bilirubin: 0.8 mg/dL (ref 0.3–1.2)
Total Protein: 7 g/dL (ref 6.5–8.1)

## 2018-02-26 LAB — CBC WITH DIFFERENTIAL/PLATELET
Basophils Absolute: 0 10*3/uL (ref 0.0–0.1)
Basophils Relative: 1 %
Eosinophils Absolute: 0.7 10*3/uL (ref 0.0–0.7)
Eosinophils Relative: 11 %
HCT: 35.4 % — ABNORMAL LOW (ref 39.0–52.0)
Hemoglobin: 11.5 g/dL — ABNORMAL LOW (ref 13.0–17.0)
Lymphocytes Relative: 37 %
Lymphs Abs: 2.3 10*3/uL (ref 0.7–4.0)
MCH: 28.8 pg (ref 26.0–34.0)
MCHC: 32.5 g/dL (ref 30.0–36.0)
MCV: 88.5 fL (ref 78.0–100.0)
Monocytes Absolute: 0.7 10*3/uL (ref 0.1–1.0)
Monocytes Relative: 12 %
Neutro Abs: 2.5 10*3/uL (ref 1.7–7.7)
Neutrophils Relative %: 39 %
Platelets: 229 10*3/uL (ref 150–400)
RBC: 4 MIL/uL — ABNORMAL LOW (ref 4.22–5.81)
RDW: 13.3 % (ref 11.5–15.5)
WBC: 6.2 10*3/uL (ref 4.0–10.5)

## 2018-02-26 LAB — TROPONIN I
Troponin I: 0.03 ng/mL (ref <0.03)
Troponin I: 0.04 ng/mL (ref <0.03)

## 2018-02-26 MED ORDER — ALBUTEROL (5 MG/ML) CONTINUOUS INHALATION SOLN
10.0000 mg/h | INHALATION_SOLUTION | RESPIRATORY_TRACT | Status: DC
  Administered 2018-02-26: 10 mg/h via RESPIRATORY_TRACT
  Filled 2018-02-26: qty 20

## 2018-02-26 MED ORDER — DOXYCYCLINE HYCLATE 100 MG PO TABS: 100.0000 mg | ORAL_TABLET | Freq: Two times a day (BID) | ORAL | 0 refills | Status: DC

## 2018-02-26 MED ORDER — METHYLPREDNISOLONE SODIUM SUCC 125 MG IJ SOLR
125.0000 mg | Freq: Once | INTRAMUSCULAR | Status: AC
  Administered 2018-02-26: 125 mg via INTRAVENOUS
  Filled 2018-02-26: qty 2

## 2018-02-26 MED ORDER — DOXYCYCLINE HYCLATE 100 MG PO TABS
100.0000 mg | ORAL_TABLET | Freq: Once | ORAL | Status: AC
  Administered 2018-02-26: 100 mg via ORAL
  Filled 2018-02-26: qty 1

## 2018-02-26 MED ORDER — PREDNISONE 20 MG PO TABS: ORAL_TABLET | ORAL | 0 refills | Status: DC

## 2018-02-26 NOTE — ED Triage Notes (Signed)
Sob x 3 days.  Given albuterol tx by ems.

## 2018-02-26 NOTE — ED Notes (Signed)
edp notified that pt's discharge papers have not prepared.  States he will take care of it.

## 2018-02-26 NOTE — Telephone Encounter (Signed)
Pt lVM on machine that his COPD is flared up, with a bad cough. I returned the pt call and LVM that Dr Meda Coffee had availability that he needed to be seen, and to please call the office

## 2018-02-26 NOTE — ED Provider Notes (Signed)
[facility] Provider Note   CSN: [id number] Arrival date & time: 02/26/18  1642     History   Chief Complaint Chief Complaint  Patient presents with  . Shortness of Breath    HPI Matthew Molina is a 67 y.o. male.  HPI Patient with history of COPD on home O2 via [place] at 2-3 L presents with 1 day of cough, shortness of breath and wheezing.  States cough is been productive of brown sputum.  Denies chest pain, lower extremity swelling or pain.  No fever or chills. Past Medical History:  Diagnosis Date  . Acute kidney injury ([place])   . Acute respiratory failure with hypoxia ([place])   . Arthritis   . COPD (chronic obstructive pulmonary disease) ([place])   . Depression   . Diabetes mellitus   . Diabetes mellitus without complication ([place])   . Hypercholesterolemia   . Hyperlipidemia   . Hypertension   . Multiple lung nodules on CT 04/02/2015  . Nicotine addiction   . Obesity   . Oxygen deficiency    qhs  . Schizophrenia [facility])     Patient Active Problem List   Diagnosis Date Noted  . Skin infection 12/09/2017  . Unsteady gait 04/18/2017  . COPD exacerbation ([place]) 02/05/2017  . Elevated PSA 10/01/2016  . External hemorrhoid, thrombosed 08/24/2016  . GERD (gastroesophageal reflux disease) 08/24/2016  . CKD (chronic kidney disease) stage 3, GFR 30-59 ml/min (HCC) 05/22/2016  . Dysphagia 01/05/2016  . Insomnia 01/05/2016  . COPD (chronic obstructive pulmonary disease) ([place]) 11/05/2015  . Hypertension 11/05/2015  . Tobacco abuse 07/24/2015  . Coronary atherosclerosis of native coronary artery 04/02/2015  . Multiple lung nodules on CT 04/02/2015  . Annual physical exam 09/08/2012  . GAD (generalized anxiety disorder) 07/04/2012  . Back pain with radiation 11/01/2011  . Diabetes mellitus type 2 in obese ([place]) 04/17/2011  . Hyperlipidemia 06/02/2008  . Obesity (BMI 30.0-34.9) 06/02/2008  . Schizophrenia, unspecified type ([place]) 06/02/2008    Past Surgical  History:  Procedure Laterality Date  . CATARACT EXTRACTION W/PHACO Left 11/20/2013   Procedure: CATARACT EXTRACTION PHACO AND INTRAOCULAR LENS PLACEMENT (IOC);  Surgeon: Tonny Branch, MD;  Location: [facility] ORS;  Service: Ophthalmology;  Laterality: Left;  CDE:10.26  . CATARACT EXTRACTION W/PHACO Right 12/08/2013   Procedure: RIGHT EYE CATARACT EXTRACTION PHACO AND INTRAOCULAR LENS PLACEMENT ;  Surgeon: Tonny Branch, MD;  Location: [facility] ORS;  Service: Ophthalmology;  Laterality: Right;  CDE 12.38  . COLONOSCOPY N/A 04/01/2013   Procedure: COLONOSCOPY;  Surgeon: Danie Binder, MD;  Location: [facility] ENDO SUITE;  Service: Endoscopy;  Laterality: N/A;  10:00 AM-moved to [place] notified pt  . COLONOSCOPY  2014   INCOMPLETE PREP IN R COLON  . ESOPHAGOGASTRODUODENOSCOPY N/A 02/18/2016   Procedure: ESOPHAGOGASTRODUODENOSCOPY (EGD);  Surgeon: Danie Binder, MD;  Location: [facility] ENDO SUITE;  Service: Endoscopy;  Laterality: N/A;  1145  . EYE SURGERY Left 11/2013   cataract extraction  . SAVORY DILATION N/A 02/18/2016   Procedure: SAVORY DILATION;  Surgeon: Danie Binder, MD;  Location: [facility] ENDO SUITE;  Service: Endoscopy;  Laterality: N/A;  . SHOULDER SURGERY         Home Medications    Prior to Admission medications   Medication Sig Start Date End Date Taking? Authorizing Provider  acetaminophen (TYLENOL) 500 MG tablet Take 500 mg by mouth daily as needed for moderate pain.    Yes [provider]  aspirin EC 81 MG  tablet Take 81 mg by mouth daily.   Yes [provider]  benzonatate (TESSALON) 100 MG capsule Take 1 capsule (100 mg total) by mouth 2 (two) times daily as needed for cough. 02/19/18  Yes Fayrene Helper, MD  busPIRone (BUSPAR) 7.5 MG tablet TAKE 1 TABLET BY MOUTH 3 TIMES DAILY. 01/21/18  Yes Fayrene Helper, MD  docusate sodium (COLACE) 100 MG capsule Take 100 mg by mouth daily.   Yes [provider]  glipiZIDE (GLIPIZIDE XL) 5 MG 24 hr tablet Take 1 tablet (5 mg  total) by mouth daily with breakfast. 12/04/17  Yes Fayrene Helper, MD  ipratropium-albuterol (DUONEB) 0.5-2.5 (3) MG/3ML SOLN INHALE 1 VIAL VIA NEBULIZER EVERY FOUR HOURS AS NEEDED. 03/02/16  Yes Fayrene Helper, MD  olmesartan (BENICAR) 40 MG tablet Take 1 tablet (40 mg total) by mouth daily. 12/04/17  Yes Fayrene Helper, MD  polyethylene glycol powder (GLYCOLAX/MIRALAX) powder MIX 1 CAPFUL (17G) IN 8 OUNCES OF JUICE/WATER AND DRINK ONCE DAILY. Patient taking differently: MIX 1 CAPFUL (17G) IN 8 OUNCES OF JUICE/WATER AND DRINK ONCE DAILY AS NEEDED 12/23/15  Yes Fayrene Helper, MD  pravastatin (PRAVACHOL) 20 MG tablet TAKE (1) TABLET BY MOUTH AT BEDTIME FOR CHOLESTEROL. 12/04/17  Yes Fayrene Helper, MD  PROAIR HFA 108 [phone] Base) MCG/ACT inhaler INHALE 2 PUFFS INTO THE LUNGS EVERY 4 HOURS AS NEEDED FOR WHEEZING ORSHORTNESS OF BREATH. 01/31/18  Yes Raylene Everts, MD  risperiDONE (RISPERDAL) 1 MG tablet TAKE (1) TABLET BY MOUTH AT BEDTIME WITH '4MG'$ . 02/18/18  Yes Fayrene Helper, MD  SYMBICORT 80-4.5 MCG/ACT inhaler INHALE 2 PUFFS INTO LUNGS TWICE DAILY. 02/18/18  Yes Fayrene Helper, MD  tiotropium (SPIRIVA) 18 MCG inhalation capsule Place 1 capsule (18 mcg total) into inhaler and inhale daily. 04/17/17  Yes Fayrene Helper, MD  traZODone (DESYREL) 50 MG tablet TAKE ONE TABLET BY MOUTH AT BEDTIME AS NEEDED FOR SLEEP. 10/23/17  Yes Fayrene Helper, MD  triamterene-hydrochlorothiazide (MAXZIDE-25) 37.5-25 MG tablet TAKE (1/2) TABLET BY MOUTH ONCE DAILY. 12/04/17  Yes Fayrene Helper, MD  ACCU-CHEK AVIVA PLUS test strip USE TO TEST ONCE DAILY 11/05/17   Fayrene Helper, MD  ACCU-CHEK SOFTCLIX LANCETS lancets TEST BLOOD SUGAR ONCE DAILY AS DIRECTED. 07/26/16   Fayrene Helper, MD  azithromycin (ZITHROMAX) 250 MG tablet Two tablets on day one, then one tablet once daily for an additional 4 days Patient not taking: Reported on 02/26/2018 02/19/18   Fayrene Helper,  MD  blood glucose meter kit and supplies Dispense based on patient and insurance preference.Requesting accuchek meter. Once daily testing dx e11.9 12/28/17   Fayrene Helper, MD  doxycycline (VIBRA-TABS) 100 MG tablet Take 1 tablet (100 mg total) by mouth 2 (two) times daily. 02/26/18   Julianne Rice, MD  predniSONE (DELTASONE) 20 MG tablet 3 tabs po day one, then 2 po daily x 4 days 02/26/18   Julianne Rice, MD  risperidone (RISPERDAL) 4 MG tablet TAKE (1) TABLET BY MOUTH AT BEDTIME. Patient not taking: Reported on 02/26/2018 01/07/18   Fayrene Helper, MD  sildenafil (VIAGRA) 100 MG tablet Take 100 mg by mouth. Take one tablet 30 mins before intercourse   03/11/12  [provider]    Family History Family History  Problem Relation Age of Onset  . Diabetes Mother   . Hypertension Mother   . Stroke Mother   . Diabetes Sister   . Heart disease Sister   .  Kidney disease Father   . Drug abuse Brother   . Colon cancer Neg Hx   . Colon polyps Neg Hx     Social History Social History   Tobacco Use  . Smoking status: Current Every Day Smoker    Packs/day: 1.00    Years: 48.00    Pack years: 48.00    Types: Cigarettes    Last attempt to quit: 05/02/2017    Years since quitting: 0.8  . Smokeless tobacco: Never Used  . Tobacco comment: quit a week ago  Substance Use Topics  . Alcohol use: No    Alcohol/week: 0.0 oz  . Drug use: No     Allergies   Sertraline hcl and Wellbutrin [bupropion]   Review of Systems Review of Systems  Constitutional: Negative for chills and fever.  HENT: Negative for congestion, sore throat and trouble swallowing.   Respiratory: Positive for cough, shortness of breath and wheezing.   Cardiovascular: Negative for chest pain, palpitations and leg swelling.  Gastrointestinal: Negative for abdominal pain, constipation, diarrhea, nausea and vomiting.  Musculoskeletal: Negative for arthralgias, back pain, myalgias and neck pain.  Skin:  Negative for rash and wound.  Neurological: Negative for dizziness, syncope, weakness, light-headedness, numbness and headaches.  All other systems reviewed and are negative.    Physical Exam Updated Vital Signs BP 118/79   Pulse (!) 102   Temp 98.2 F (36.8 C) (Oral)   Resp (!) 22   SpO2 96%   Physical Exam  Constitutional: He is oriented to person, place, and time. He appears well-developed and well-nourished. No distress.  HENT:  Head: Normocephalic and atraumatic.  Mouth/Throat: Oropharynx is clear and moist. No oropharyngeal exudate.  Eyes: EOM are normal. Pupils are equal, round, and reactive to light.  Neck: Normal range of motion. Neck supple. No JVD present.  Cardiovascular: Regular rhythm. Exam reveals no gallop and no friction rub.  No murmur heard. Tachycardia  Pulmonary/Chest: Effort normal and breath sounds normal.  Diffuse expiratory wheezing.  Near continuous coughing during exam  Abdominal: Soft. Bowel sounds are normal. There is no tenderness. There is no rebound and no guarding.  Musculoskeletal: Normal range of motion. He exhibits no edema or tenderness.  No lower extremity swelling, asymmetry or tenderness.  Distal pulses are 2+.  Lymphadenopathy:    He has no cervical adenopathy.  Neurological: He is alert and oriented to person, place, and time.  Skin: Skin is warm and dry. No rash noted. He is not diaphoretic. No erythema.  Psychiatric: He has a normal mood and affect. His behavior is normal.  Nursing note and vitals reviewed.    ED Treatments / Results  Labs (all labs ordered are listed, but only abnormal results are displayed) Labs Reviewed  CBC WITH DIFFERENTIAL/PLATELET - Abnormal; Notable for the following components:      Result Value   RBC 4.00 (*)    Hemoglobin 11.5 (*)    HCT 35.4 (*)    All other components within normal limits  COMPREHENSIVE METABOLIC PANEL - Abnormal; Notable for the following components:   Sodium 131 (*)     Chloride 97 (*)    Creatinine, Ser 1.32 (*)    GFR calc non Af Amer 54 (*)    All other components within normal limits  TROPONIN I - Abnormal; Notable for the following components:   Troponin I 0.03 (*)    All other components within normal limits  TROPONIN I - Abnormal; Notable for the following components:  Troponin I 0.04 (*)    All other components within normal limits    EKG  EKG Interpretation  Date/Time:  Tuesday February 26 2018 16:50:36 EDT Ventricular Rate:  105 PR Interval:    QRS Duration: 75 QT Interval:  327 QTC Calculation: 435 R Axis:   -55 Text Interpretation:  Sinus tachycardia Left anterior fascicular block RSR' in V1 or V2, right VCD or RVH Confirmed by Julianne Rice [phone]) on 02/26/2018 5:32:48 PM       Radiology Dg Chest Port 1 View  Result Date: 02/26/2018 CLINICAL DATA:  Shortness of breath for several days EXAM: PORTABLE CHEST 1 VIEW COMPARISON:  04/12/2017 FINDINGS: Cardiac shadow is stable. The lungs are well aerated bilaterally. No focal infiltrate or sizable effusion is seen. IMPRESSION: No active disease. Electronically Signed   By: Inez Catalina M.D.   On: 02/26/2018 17:50    Procedures Procedures (including critical care time)  Medications Ordered in ED Medications  albuterol (PROVENTIL,VENTOLIN) solution continuous neb (10 mg/hr Nebulization New Bag/Given 02/26/18 1754)  methylPREDNISolone sodium succinate (SOLU-MEDROL) 125 mg/2 mL injection 125 mg (125 mg Intravenous Given 02/26/18 1735)  doxycycline (VIBRA-TABS) tablet 100 mg (100 mg Oral Given 02/26/18 2053)     Initial Impression / Assessment and Plan / ED Course  I have reviewed the triage vital signs and the nursing notes.  Pertinent labs & imaging results that were available during my care of the patient were reviewed by me and considered in my medical decision making (see chart for details).     Patient states he is feeling much better after continuous nebulized treatment.   Still has a few scattered wheezes but is satting mid upper 90s on 3 mL of oxygen.  States he is at his baseline status.  Will give dose of doxycycline and discharged home with short course of prednisone.   Initial troponin is 0.03.  Will get delta trop but anticipate discharge home.  The patient continues to deny any chest pain. Repeat troponin is 0.04.  No significant change.  Appears to be at his baseline when compared to prior testing.  Return precautions given. Final Clinical Impressions(s) / ED Diagnoses   Final diagnoses:  COPD exacerbation [facility])    ED Discharge Orders        Ordered    doxycycline (VIBRA-TABS) 100 MG tablet  2 times daily     02/26/18 2117    predniSONE (DELTASONE) 20 MG tablet     02/26/18 2117       Julianne Rice, MD 02/26/18 2218

## 2018-02-27 ENCOUNTER — Ambulatory Visit: Payer: Medicare Other | Admitting: Podiatry

## 2018-02-27 MED ORDER — DOXYCYCLINE HYCLATE 100 MG PO CAPS: 100.0000 mg | ORAL_CAPSULE | Freq: Two times a day (BID) | ORAL | 0 refills | Status: DC

## 2018-02-27 MED ORDER — PREDNISONE 20 MG PO TABS
ORAL_TABLET | ORAL | 0 refills | Status: DC
Start: 2018-02-27 — End: 2018-03-31

## 2018-02-27 NOTE — ED Notes (Signed)
Attempted to call pt again to confirm, that pt received discharge instructions and prescriptions. Pt reported has turned in prescriptions to pharmacy and they are in process of getting filled. Pt also reports has discharge instructions with correct name on them.

## 2018-02-27 NOTE — ED Provider Notes (Signed)
1 AM patient is in no respiratory distress speaks in paragraphs feels ready to go home I will write prescriptions for prednisone and doxycycline.   Orlie Dakin, MD 02/27/18 [phone]

## 2018-03-01 DIAGNOSIS — J449 Chronic obstructive pulmonary disease, unspecified: Secondary | ICD-10-CM | POA: Diagnosis not present

## 2018-03-12 ENCOUNTER — Other Ambulatory Visit: Payer: Self-pay | Admitting: Family Medicine

## 2018-03-21 ENCOUNTER — Other Ambulatory Visit: Payer: Self-pay | Admitting: Family Medicine

## 2018-03-26 ENCOUNTER — Other Ambulatory Visit: Payer: Self-pay | Admitting: Family Medicine

## 2018-03-29 ENCOUNTER — Other Ambulatory Visit: Payer: Self-pay

## 2018-03-29 ENCOUNTER — Observation Stay (HOSPITAL_COMMUNITY)
Admission: EM | Admit: 2018-03-29 | Discharge: 2018-03-31 | Disposition: A | Discharge status: Home / Self Care | Payer: Medicare Other | Attending: Internal Medicine | Admitting: Internal Medicine

## 2018-03-29 ENCOUNTER — Ambulatory Visit: Payer: Medicare Other | Admitting: Podiatry

## 2018-03-29 ENCOUNTER — Encounter (HOSPITAL_COMMUNITY): Payer: Self-pay | Admitting: Emergency Medicine

## 2018-03-29 ENCOUNTER — Emergency Department (HOSPITAL_COMMUNITY): Payer: Medicare Other

## 2018-03-29 DIAGNOSIS — N183 Chronic kidney disease, stage 3 (moderate): Secondary | ICD-10-CM | POA: Insufficient documentation

## 2018-03-29 DIAGNOSIS — R0602 Shortness of breath: Secondary | ICD-10-CM | POA: Diagnosis not present

## 2018-03-29 DIAGNOSIS — F17210 Nicotine dependence, cigarettes, uncomplicated: Secondary | ICD-10-CM | POA: Diagnosis not present

## 2018-03-29 DIAGNOSIS — I2510 Atherosclerotic heart disease of native coronary artery without angina pectoris: Secondary | ICD-10-CM | POA: Diagnosis present

## 2018-03-29 DIAGNOSIS — Z79899 Other long term (current) drug therapy: Secondary | ICD-10-CM | POA: Diagnosis not present

## 2018-03-29 DIAGNOSIS — I10 Essential (primary) hypertension: Secondary | ICD-10-CM | POA: Diagnosis present

## 2018-03-29 DIAGNOSIS — Z7982 Long term (current) use of aspirin: Secondary | ICD-10-CM | POA: Insufficient documentation

## 2018-03-29 DIAGNOSIS — J441 Chronic obstructive pulmonary disease with (acute) exacerbation: Principal | ICD-10-CM | POA: Diagnosis present

## 2018-03-29 DIAGNOSIS — E1122 Type 2 diabetes mellitus with diabetic chronic kidney disease: Secondary | ICD-10-CM | POA: Insufficient documentation

## 2018-03-29 DIAGNOSIS — E1169 Type 2 diabetes mellitus with other specified complication: Secondary | ICD-10-CM | POA: Diagnosis present

## 2018-03-29 DIAGNOSIS — I129 Hypertensive chronic kidney disease with stage 1 through stage 4 chronic kidney disease, or unspecified chronic kidney disease: Secondary | ICD-10-CM | POA: Diagnosis not present

## 2018-03-29 DIAGNOSIS — R918 Other nonspecific abnormal finding of lung field: Secondary | ICD-10-CM | POA: Diagnosis present

## 2018-03-29 DIAGNOSIS — F411 Generalized anxiety disorder: Secondary | ICD-10-CM | POA: Diagnosis present

## 2018-03-29 DIAGNOSIS — K219 Gastro-esophageal reflux disease without esophagitis: Secondary | ICD-10-CM | POA: Diagnosis present

## 2018-03-29 DIAGNOSIS — Z7984 Long term (current) use of oral hypoglycemic drugs: Secondary | ICD-10-CM | POA: Insufficient documentation

## 2018-03-29 DIAGNOSIS — E66811 Obesity, class 1: Secondary | ICD-10-CM | POA: Diagnosis present

## 2018-03-29 DIAGNOSIS — R0682 Tachypnea, not elsewhere classified: Secondary | ICD-10-CM | POA: Diagnosis not present

## 2018-03-29 DIAGNOSIS — E669 Obesity, unspecified: Secondary | ICD-10-CM | POA: Diagnosis present

## 2018-03-29 DIAGNOSIS — F209 Schizophrenia, unspecified: Secondary | ICD-10-CM | POA: Diagnosis present

## 2018-03-29 DIAGNOSIS — R05 Cough: Secondary | ICD-10-CM | POA: Diagnosis not present

## 2018-03-29 DIAGNOSIS — E785 Hyperlipidemia, unspecified: Secondary | ICD-10-CM | POA: Diagnosis present

## 2018-03-29 LAB — CBC WITH DIFFERENTIAL/PLATELET
Basophils Absolute: 0 10*3/uL (ref 0.0–0.1)
Basophils Relative: 1 %
Eosinophils Absolute: 0.9 10*3/uL — ABNORMAL HIGH (ref 0.0–0.7)
Eosinophils Relative: 11 %
HCT: 39.9 % (ref 39.0–52.0)
Hemoglobin: 12.9 g/dL — ABNORMAL LOW (ref 13.0–17.0)
Lymphocytes Relative: 35 %
Lymphs Abs: 2.7 10*3/uL (ref 0.7–4.0)
MCH: 28.8 pg (ref 26.0–34.0)
MCHC: 32.3 g/dL (ref 30.0–36.0)
MCV: 89.1 fL (ref 78.0–100.0)
Monocytes Absolute: 0.8 10*3/uL (ref 0.1–1.0)
Monocytes Relative: 11 %
Neutro Abs: 3.3 10*3/uL (ref 1.7–7.7)
Neutrophils Relative %: 42 %
Platelets: 252 10*3/uL (ref 150–400)
RBC: 4.48 MIL/uL (ref 4.22–5.81)
RDW: 13.6 % (ref 11.5–15.5)
WBC: 7.7 10*3/uL (ref 4.0–10.5)

## 2018-03-29 LAB — BASIC METABOLIC PANEL
Anion gap: 12 (ref 5–15)
BUN: 11 mg/dL (ref 6–20)
CO2: 23 mmol/L (ref 22–32)
Calcium: 10 mg/dL (ref 8.9–10.3)
Chloride: 95 mmol/L — ABNORMAL LOW (ref 101–111)
Creatinine, Ser: 1.62 mg/dL — ABNORMAL HIGH (ref 0.61–1.24)
GFR calc Af Amer: 49 mL/min — ABNORMAL LOW (ref >60)
GFR calc non Af Amer: 42 mL/min — ABNORMAL LOW (ref >60)
Glucose, Bld: 97 mg/dL (ref 65–99)
Potassium: 3.6 mmol/L (ref 3.5–5.1)
Sodium: 130 mmol/L — ABNORMAL LOW (ref 135–145)

## 2018-03-29 LAB — TROPONIN I: Troponin I: <0.03 ng/mL (ref <0.03)

## 2018-03-29 LAB — PHOSPHORUS: Phosphorus: 3.3 mg/dL (ref 2.5–4.6)

## 2018-03-29 LAB — BRAIN NATRIURETIC PEPTIDE: B Natriuretic Peptide: 17 pg/mL (ref 0.0–100.0)

## 2018-03-29 LAB — MAGNESIUM: Magnesium: 2 mg/dL (ref 1.7–2.4)

## 2018-03-29 MED ORDER — POTASSIUM CHLORIDE IN NACL 40-0.9 MEQ/L-% IV SOLN
INTRAVENOUS | Status: DC
  Administered 2018-03-29: 125 mL/h via INTRAVENOUS
  Filled 2018-03-29: qty 1000

## 2018-03-29 MED ORDER — ENOXAPARIN SODIUM 40 MG/0.4ML ~~LOC~~ SOLN
40.0000 mg | SUBCUTANEOUS | Status: DC
  Administered 2018-03-30 (×2): 40 mg via SUBCUTANEOUS
  Filled 2018-03-29 (×2): qty 0.4

## 2018-03-29 MED ORDER — ONDANSETRON HCL 4 MG/2ML IJ SOLN: 4.0000 mg | Freq: Four times a day (QID) | INTRAMUSCULAR | Status: DC | PRN

## 2018-03-29 MED ORDER — ALBUTEROL SULFATE (2.5 MG/3ML) 0.083% IN NEBU
2.5000 mg | INHALATION_SOLUTION | Freq: Four times a day (QID) | RESPIRATORY_TRACT | Status: DC
  Administered 2018-03-29: 2.5 mg via RESPIRATORY_TRACT

## 2018-03-29 MED ORDER — ALBUTEROL (5 MG/ML) CONTINUOUS INHALATION SOLN
10.0000 mg/h | INHALATION_SOLUTION | RESPIRATORY_TRACT | Status: DC
  Administered 2018-03-29: 10 mg/h via RESPIRATORY_TRACT
  Filled 2018-03-29: qty 20

## 2018-03-29 MED ORDER — INSULIN ASPART 100 UNIT/ML ~~LOC~~ SOLN
0.0000 [IU] | Freq: Three times a day (TID) | SUBCUTANEOUS | Status: DC
  Administered 2018-03-30: 4 [IU] via SUBCUTANEOUS
  Administered 2018-03-30 (×2): 7 [IU] via SUBCUTANEOUS
  Administered 2018-03-31: 4 [IU] via SUBCUTANEOUS
  Administered 2018-03-31: 3 [IU] via SUBCUTANEOUS

## 2018-03-29 MED ORDER — ACETAMINOPHEN 650 MG RE SUPP: 650.0000 mg | Freq: Four times a day (QID) | RECTAL | Status: DC | PRN

## 2018-03-29 MED ORDER — IPRATROPIUM BROMIDE 0.02 % IN SOLN
0.5000 mg | Freq: Four times a day (QID) | RESPIRATORY_TRACT | Status: DC
  Administered 2018-03-29: 0.5 mg via RESPIRATORY_TRACT

## 2018-03-29 MED ORDER — METHYLPREDNISOLONE SODIUM SUCC 125 MG IJ SOLR
125.0000 mg | Freq: Once | INTRAMUSCULAR | Status: AC
  Administered 2018-03-29: 125 mg via INTRAVENOUS
  Filled 2018-03-29: qty 2

## 2018-03-29 MED ORDER — ALBUTEROL SULFATE (2.5 MG/3ML) 0.083% IN NEBU: 2.5000 mg | INHALATION_SOLUTION | RESPIRATORY_TRACT | Status: DC | PRN

## 2018-03-29 MED ORDER — METHYLPREDNISOLONE SODIUM SUCC 125 MG IJ SOLR
60.0000 mg | Freq: Four times a day (QID) | INTRAMUSCULAR | Status: AC
  Administered 2018-03-30 (×4): 60 mg via INTRAVENOUS
  Filled 2018-03-29 (×4): qty 2

## 2018-03-29 MED ORDER — ACETAMINOPHEN 325 MG PO TABS: 650.0000 mg | ORAL_TABLET | Freq: Four times a day (QID) | ORAL | Status: DC | PRN

## 2018-03-29 MED ORDER — MAGNESIUM SULFATE 4 GM/100ML IV SOLN
4.0000 g | Freq: Once | INTRAVENOUS | Status: AC
  Administered 2018-03-30: 4 g via INTRAVENOUS
  Filled 2018-03-29: qty 100

## 2018-03-29 MED ORDER — ONDANSETRON HCL 4 MG PO TABS: 4.0000 mg | ORAL_TABLET | Freq: Four times a day (QID) | ORAL | Status: DC | PRN

## 2018-03-29 NOTE — ED Provider Notes (Signed)
[facility] EMERGENCY DEPARTMENT Provider Note   CSN: [id number] Arrival date & time: 03/29/18  1931     History   Chief Complaint Chief Complaint  Patient presents with  . Shortness of Breath    HPI Matthew Molina is a 67 y.o. male.  Pt presents to the ED today with sob.  The pt has been sob since Wednesday (4/10).  He has a hx of COPD and wears 3L home O2.  The pt was given 1 albuterol neb en route which has helped his breathing a little.  No fever.  No cp.     Past Medical History:  Diagnosis Date  . Acute kidney injury ([place])   . Acute respiratory failure with hypoxia ([place])   . Arthritis   . COPD (chronic obstructive pulmonary disease) ([place])   . Depression   . Diabetes mellitus   . Diabetes mellitus without complication ([place])   . Hypercholesterolemia   . Hyperlipidemia   . Hypertension   . Multiple lung nodules on CT 04/02/2015  . Nicotine addiction   . Obesity   . Oxygen deficiency    qhs  . Schizophrenia [facility])     Patient Active Problem List   Diagnosis Date Noted  . Skin infection 12/09/2017  . Unsteady gait 04/18/2017  . COPD exacerbation ([place]) 02/05/2017  . Elevated PSA 10/01/2016  . External hemorrhoid, thrombosed 08/24/2016  . GERD (gastroesophageal reflux disease) 08/24/2016  . CKD (chronic kidney disease) stage 3, GFR 30-59 ml/min (HCC) 05/22/2016  . Dysphagia 01/05/2016  . Insomnia 01/05/2016  . COPD (chronic obstructive pulmonary disease) ([place]) 11/05/2015  . Hypertension 11/05/2015  . Tobacco abuse 07/24/2015  . Coronary atherosclerosis of native coronary artery 04/02/2015  . Multiple lung nodules on CT 04/02/2015  . Annual physical exam 09/08/2012  . GAD (generalized anxiety disorder) 07/04/2012  . Back pain with radiation 11/01/2011  . Diabetes mellitus type 2 in obese ([place]) 04/17/2011  . Hyperlipidemia 06/02/2008  . Obesity (BMI 30.0-34.9) 06/02/2008  . Schizophrenia, unspecified type ([place]) 06/02/2008    Past Surgical History:    Procedure Laterality Date  . CATARACT EXTRACTION W/PHACO Left 11/20/2013   Procedure: CATARACT EXTRACTION PHACO AND INTRAOCULAR LENS PLACEMENT (IOC);  Surgeon: Tonny Branch, MD;  Location: [facility] ORS;  Service: Ophthalmology;  Laterality: Left;  CDE:10.26  . CATARACT EXTRACTION W/PHACO Right 12/08/2013   Procedure: RIGHT EYE CATARACT EXTRACTION PHACO AND INTRAOCULAR LENS PLACEMENT ;  Surgeon: Tonny Branch, MD;  Location: [facility] ORS;  Service: Ophthalmology;  Laterality: Right;  CDE 12.38  . COLONOSCOPY N/A 04/01/2013   Procedure: COLONOSCOPY;  Surgeon: Danie Binder, MD;  Location: [facility] ENDO SUITE;  Service: Endoscopy;  Laterality: N/A;  10:00 AM-moved to [place] notified pt  . COLONOSCOPY  2014   INCOMPLETE PREP IN R COLON  . ESOPHAGOGASTRODUODENOSCOPY N/A 02/18/2016   Procedure: ESOPHAGOGASTRODUODENOSCOPY (EGD);  Surgeon: Danie Binder, MD;  Location: [facility] ENDO SUITE;  Service: Endoscopy;  Laterality: N/A;  1145  . EYE SURGERY Left 11/2013   cataract extraction  . SAVORY DILATION N/A 02/18/2016   Procedure: SAVORY DILATION;  Surgeon: Danie Binder, MD;  Location: [facility] ENDO SUITE;  Service: Endoscopy;  Laterality: N/A;  . SHOULDER SURGERY          Home Medications    Prior to Admission medications   Medication Sig Start Date End Date Taking? Authorizing Provider  ACCU-CHEK AVIVA PLUS test strip USE TO TEST ONCE DAILY 11/05/17   Fayrene Helper, MD  ACCU-CHEK SOFTCLIX LANCETS lancets TEST BLOOD SUGAR ONCE DAILY AS DIRECTED. 07/26/16   Fayrene Helper, MD  acetaminophen (TYLENOL) 500 MG tablet Take 500 mg by mouth daily as needed for moderate pain.     [provider]  aspirin EC 81 MG tablet Take 81 mg by mouth daily.    [provider]  azithromycin (ZITHROMAX) 250 MG tablet Two tablets on day one, then one tablet once daily for an additional 4 days Patient not taking: Reported on 02/26/2018 02/19/18   Fayrene Helper, MD  benzonatate (TESSALON) 100 MG capsule Take 1  capsule (100 mg total) by mouth 2 (two) times daily as needed for cough. 02/19/18   Fayrene Helper, MD  blood glucose meter kit and supplies Dispense based on patient and insurance preference.Requesting accuchek meter. Once daily testing dx e11.9 12/28/17   Fayrene Helper, MD  busPIRone (BUSPAR) 7.5 MG tablet TAKE 1 TABLET BY MOUTH 3 TIMES DAILY. 03/12/18   Fayrene Helper, MD  docusate sodium (COLACE) 100 MG capsule Take 100 mg by mouth daily.    [provider]  doxycycline (VIBRA-TABS) 100 MG tablet Take 1 tablet (100 mg total) by mouth 2 (two) times daily. 02/26/18   Julianne Rice, MD  doxycycline (VIBRAMYCIN) 100 MG capsule Take 1 capsule (100 mg total) by mouth 2 (two) times daily. One po bid x 7 days 02/27/18   Orlie Dakin, MD  glipiZIDE (GLIPIZIDE XL) 5 MG 24 hr tablet Take 1 tablet (5 mg total) by mouth daily with breakfast. 12/04/17   Fayrene Helper, MD  ipratropium-albuterol (DUONEB) 0.5-2.5 (3) MG/3ML SOLN INHALE 1 VIAL VIA NEBULIZER EVERY FOUR HOURS AS NEEDED. 03/02/16   Fayrene Helper, MD  ipratropium-albuterol (DUONEB) 0.5-2.5 (3) MG/3ML SOLN USE 1 VIAL(3 ML) IN NEBULIZER EVERY 4 TO 6 HOURS 03/26/18   Fayrene Helper, MD  olmesartan (BENICAR) 40 MG tablet Take 1 tablet (40 mg total) by mouth daily. 12/04/17   Fayrene Helper, MD  polyethylene glycol powder (GLYCOLAX/MIRALAX) powder MIX 1 CAPFUL (17G) IN 8 OUNCES OF JUICE/WATER AND DRINK ONCE DAILY. Patient taking differently: MIX 1 CAPFUL (17G) IN 8 OUNCES OF JUICE/WATER AND DRINK ONCE DAILY AS NEEDED 12/23/15   Fayrene Helper, MD  pravastatin (PRAVACHOL) 20 MG tablet TAKE (1) TABLET BY MOUTH AT BEDTIME FOR CHOLESTEROL. 12/04/17   Fayrene Helper, MD  predniSONE (DELTASONE) 20 MG tablet 3 tabs po day one, then 2 po daily x 4 days 02/26/18   Julianne Rice, MD  predniSONE (DELTASONE) 20 MG tablet 2 tabs po daily x 4 days 02/27/18   Orlie Dakin, MD  PROAIR HFA 108 [phone] Base) MCG/ACT inhaler  INHALE 2 PUFFS INTO THE LUNGS EVERY 4 HOURS AS NEEDED FOR WHEEZING ORSHORTNESS OF BREATH. 01/31/18   Raylene Everts, MD  risperiDONE (RISPERDAL) 1 MG tablet TAKE (1) TABLET BY MOUTH AT BEDTIME WITH 4MG. 03/12/18   Fayrene Helper, MD  risperidone (RISPERDAL) 4 MG tablet TAKE (1) TABLET BY MOUTH AT BEDTIME. Patient not taking: Reported on 02/26/2018 01/07/18   Fayrene Helper, MD  SYMBICORT 80-4.5 MCG/ACT inhaler INHALE 2 PUFFS INTO LUNGS TWICE DAILY. 03/21/18   Fayrene Helper, MD  tiotropium (SPIRIVA) 18 MCG inhalation capsule Place 1 capsule (18 mcg total) into inhaler and inhale daily. 04/17/17   Fayrene Helper, MD  traZODone (DESYREL) 50 MG tablet TAKE ONE TABLET BY MOUTH AT BEDTIME AS NEEDED FOR SLEEP. 10/23/17   Fayrene Helper,  MD  triamterene-hydrochlorothiazide (MAXZIDE-25) 37.5-25 MG tablet TAKE (1/2) TABLET BY MOUTH ONCE DAILY. 12/04/17   Fayrene Helper, MD  sildenafil (VIAGRA) 100 MG tablet Take 100 mg by mouth. Take one tablet 30 mins before intercourse   03/11/12  [provider]    Family History Family History  Problem Relation Age of Onset  . Diabetes Mother   . Hypertension Mother   . Stroke Mother   . Diabetes Sister   . Heart disease Sister   . Kidney disease Father   . Drug abuse Brother   . Colon cancer Neg Hx   . Colon polyps Neg Hx     Social History Social History   Tobacco Use  . Smoking status: Current Every Day Smoker    Packs/day: 1.00    Years: 48.00    Pack years: 48.00    Types: Cigarettes    Last attempt to quit: 05/02/2017    Years since quitting: 0.9  . Smokeless tobacco: Never Used  . Tobacco comment: quit a week ago  Substance Use Topics  . Alcohol use: No    Alcohol/week: 0.0 oz  . Drug use: No     Allergies   Sertraline hcl and Wellbutrin [bupropion]   Review of Systems Review of Systems  Respiratory: Positive for shortness of breath and wheezing.   All other systems reviewed and are  negative.    Physical Exam Updated Vital Signs BP 118/90   Pulse (!) 112   Temp 98.5 F (36.9 C) (Oral)   Resp (!) 25   Ht _0  (1.854 m)   Wt 108.9 kg (240 lb)   SpO2 92%   BMI 31.66 kg/m   Physical Exam  Constitutional: He is oriented to person, place, and time. He appears well-developed and well-nourished. He appears distressed.  HENT:  Head: Normocephalic and atraumatic.  Mouth/Throat: Oropharynx is clear and moist.  Eyes: Pupils are equal, round, and reactive to light. EOM are normal.  Neck: Normal range of motion. Neck supple.  Cardiovascular: Regular rhythm. Tachycardia present.  Pulmonary/Chest: Tachypnea noted. He has wheezes.  Abdominal: Soft. Bowel sounds are normal.  Musculoskeletal: Normal range of motion.       Right lower leg: Normal.       Left lower leg: Normal.  Neurological: He is alert and oriented to person, place, and time.  Skin: Skin is warm and dry. Capillary refill takes less than 2 seconds.  Psychiatric: He has a normal mood and affect. His behavior is normal.  Nursing note and vitals reviewed.    ED Treatments / Results  Labs (all labs ordered are listed, but only abnormal results are displayed) Labs Reviewed  BASIC METABOLIC PANEL - Abnormal; Notable for the following components:      Result Value   Sodium 130 (*)    Chloride 95 (*)    Creatinine, Ser 1.62 (*)    GFR calc non Af Amer 42 (*)    GFR calc Af Amer 49 (*)    All other components within normal limits  BRAIN NATRIURETIC PEPTIDE  TROPONIN I    EKG EKG Interpretation  Date/Time:  Friday March 29 2018 19:38:19 EDT Ventricular Rate:  111 PR Interval:    QRS Duration: 97 QT Interval:  340 QTC Calculation: 473 R Axis:   -86 Text Interpretation:  Sinus tachycardia with irregular rate Left anterior fascicular block Low voltage, precordial leads Consider right ventricular hypertrophy Nonspecific repol abnormality, lateral leads Baseline wander in lead(s) I  III aVL Confirmed  by Isla Pence [phone]) on 03/29/2018 7:43:45 PM   Radiology Dg Chest Port 1 View  Result Date: 03/29/2018 CLINICAL DATA:  Productive cough and shortness of breath EXAM: PORTABLE CHEST 1 VIEW COMPARISON:  02/26/2018 FINDINGS: Normal heart size. Mild aortic tortuosity. Low lung volumes. There is no edema, consolidation, effusion, or pneumothorax. IMPRESSION: No evidence of active disease. Electronically Signed   By: Monte Fantasia M.D.   On: 03/29/2018 20:05    Procedures Procedures (including critical care time)  Medications Ordered in ED Medications  albuterol (PROVENTIL,VENTOLIN) solution continuous neb (0 mg/hr Nebulization Stopped 03/29/18 2044)  methylPREDNISolone sodium succinate (SOLU-MEDROL) 125 mg/2 mL injection 125 mg (125 mg Intravenous Given 03/29/18 1948)     Initial Impression / Assessment and Plan / ED Course  I have reviewed the triage vital signs and the nursing notes.  Pertinent labs & imaging results that were available during my care of the patient were reviewed by me and considered in my medical decision making (see chart for details).   Pt is feeling a little better after continuous neb and solumedrol.  However, once the albuterol finished, he was sob.  He ambulated around the nurse's station with O2 sats dropping to 89% on his 3L O2.  He was visibly sob.  Pt d/w Dr. Olevia Bowens (triad) for admission.   Final Clinical Impressions(s) / ED Diagnoses   Final diagnoses:  COPD exacerbation [facility])    ED Discharge Orders    None       Isla Pence, MD 03/29/18 2219

## 2018-03-29 NOTE — ED Notes (Signed)
Report given to 300 RN. 

## 2018-03-29 NOTE — ED Notes (Signed)
Pt ambulated around nurses station with 3L [place] O2 sat fell to 89%, obvious wheezing noted. Increased WOB and pt c/o SOB.

## 2018-03-29 NOTE — H&P (Signed)
History and Physical    Matthew Molina:[id number] DOB: November 23, 1951 DOA: 03/29/2018  PCP: Fayrene Helper, MD   Patient coming from: Home.  I have personally briefly reviewed patient's old medical records in [place]  Chief Complaint: Shortness of breath.  HPI: Matthew Molina is a 67 y.o. male with medical history significant of AKI, episode of acute respiratory failure with hypoxia, osteoarthritis, COPD, depression, type 2 diabetes, hyperlipidemia, hypertension, lung nodules, tobacco use, obesity, schizophrenia who is coming to the emergency department with complaints of shortness of breath.  Per patient, on Wednesday while he was seeding smoking he started getting short of breath, associated with wheezing and a dry cough.  Since then, this has gone progressively worse.  He denies fever, chills, rhinorrhea, sore throat, hemoptysis, chest pain, palpitations, dizziness, diaphoresis, PND, orthopnea or pitting edema lower extremities.  No abdominal pain, nausea, emesis, diarrhea, constipation, melena or hematochezia.  No dysuria, frequency or hematuria.  No heat or cold intolerance.  No polyphagia, polydipsia or polyuria.  He denies rashes or pruritus.  ED Course: Initial vital signs temperature 36.9C (98.5 F), pulse 108, respirations 21, blood pressure 132/94 and O2 sat 94% on room air.  He received supplemental oxygen, Solu-Medrol 125 mg IVP and a 10 mg albuterol continuous nebulization.  He states he feels better.  His workup shows a white count of 7.7 with an unremarkable differential.  Hemoglobin 12.9 g/dL and platelets 252.  Sodium is 130, potassium 3.6, chloride 95 and CO2 23 millimolar/L.  Glucose is 97, BUN 11, creatinine 1.62, magnesium 2.0 and phosphorus 3.3 mg/dL.  His BNP was 17.0 pg/mL.  Troponin was less than 0.03 ng/mL.  His EKG was sinus tachycardia at 111 bpm with multiple PVCs, LAFB and baseline wander.  Not significantly changed from previous tracings done in March  this year.  A 1 view chest radiograph did not show any acute cardiopulmonary pathology  Review of Systems: As per HPI otherwise 10 point review of systems negative.    Past Medical History:  Diagnosis Date  . Acute kidney injury ([place])   . Acute respiratory failure with hypoxia ([place])   . Arthritis   . COPD (chronic obstructive pulmonary disease) ([place])   . Depression   . Diabetes mellitus   . Diabetes mellitus without complication ([place])   . Hypercholesterolemia   . Hyperlipidemia   . Hypertension   . Multiple lung nodules on CT 04/02/2015  . Nicotine addiction   . Obesity   . Oxygen deficiency    qhs  . Schizophrenia [facility])     Past Surgical History:  Procedure Laterality Date  . CATARACT EXTRACTION W/PHACO Left 11/20/2013   Procedure: CATARACT EXTRACTION PHACO AND INTRAOCULAR LENS PLACEMENT (IOC);  Surgeon: Tonny Branch, MD;  Location: [facility] ORS;  Service: Ophthalmology;  Laterality: Left;  CDE:10.26  . CATARACT EXTRACTION W/PHACO Right 12/08/2013   Procedure: RIGHT EYE CATARACT EXTRACTION PHACO AND INTRAOCULAR LENS PLACEMENT ;  Surgeon: Tonny Branch, MD;  Location: [facility] ORS;  Service: Ophthalmology;  Laterality: Right;  CDE 12.38  . COLONOSCOPY N/A 04/01/2013   Procedure: COLONOSCOPY;  Surgeon: Danie Binder, MD;  Location: [facility] ENDO SUITE;  Service: Endoscopy;  Laterality: N/A;  10:00 AM-moved to [place] notified pt  . COLONOSCOPY  2014   INCOMPLETE PREP IN R COLON  . ESOPHAGOGASTRODUODENOSCOPY N/A 02/18/2016   Procedure: ESOPHAGOGASTRODUODENOSCOPY (EGD);  Surgeon: Danie Binder, MD;  Location: [facility] SU[facility];  Service: Endoscopy;  Laterality: N/A;  1145  .  EYE SURGERY Left 11/2013   cataract extraction  . SAVORY DILATION N/A 02/18/2016   Procedure: SAVORY DILATION;  Surgeon: Danie Binder, MD;  Location: [facility] END[facility] SU[facility];  Service: Endoscopy;  Laterality: N/A;  . SHOULDER SURGERY       reports that he has been smoking cigarettes.  He has a 48.00 pack-year smoking history. He has never  used smokeless tobacco. He reports that he does not drink alcohol or use drugs.  Allergies  Allergen Reactions  . Sertraline Hcl     Stomach upset/pain  . Wellbutrin [Bupropion] Other (See Comments)    Makes stomach hurt    Family History  Problem Relation Age of Onset  . Diabetes Mother   . Hypertension Mother   . Stroke Mother   . Diabetes Sister   . Heart disease Sister   . Kidney disease Father   . Drug abuse Brother   . Colon cancer Neg Hx   . Colon polyps Neg Hx     Prior to Admission medications   Medication Sig Start Date End Date Taking? Authorizing Provider  ACCU-CHEK AVIVA PLUS test strip USE TO TEST ONCE DAILY 11/05/17   Fayrene Helper, MD  ACCU-CHEK SOFTCLIX LANCETS lancets TEST BLOOD SUGAR ONCE DAILY AS DIRECTED. 07/26/16   Fayrene Helper, MD  acetaminophen (TYLENOL) 500 MG tablet Take 500 mg by mouth daily as needed for moderate pain.     [provider]  aspirin EC 81 MG tablet Take 81 mg by mouth daily.    [provider]  azithromycin (ZITHROMAX) 250 MG tablet Two tablets on day one, then one tablet once daily for an additional 4 days Patient not taking: Reported on 02/26/2018 02/19/18   Fayrene Helper, MD  benzonatate (TESSALON) 100 MG capsule Take 1 capsule (100 mg total) by mouth 2 (two) times daily as needed for cough. 02/19/18   Fayrene Helper, MD  blood glucose meter kit and supplies Dispense based on patient and insurance preference.Requesting accuchek meter. Once daily testing dx e11.9 12/28/17   Fayrene Helper, MD  busPIRone (BUSPAR) 7.5 MG tablet TAKE 1 TABLET BY MOUTH 3 TIMES DAILY. 03/12/18   Fayrene Helper, MD  docusate sodium (COLACE) 100 MG capsule Take 100 mg by mouth daily.    [provider]  doxycycline (VIBRA-TABS) 100 MG tablet Take 1 tablet (100 mg total) by mouth 2 (two) times daily. 02/26/18   Julianne Rice, MD  doxycycline (VIBRAMYCIN) 100 MG capsule Take 1 capsule (100 mg total) by mouth  2 (two) times daily. One po bid x 7 days 02/27/18   Orlie Dakin, MD  glipiZIDE (GLIPIZIDE XL) 5 MG 24 hr tablet Take 1 tablet (5 mg total) by mouth daily with breakfast. 12/04/17   Fayrene Helper, MD  ipratropium-albuterol (DUONEB) 0.5-2.5 (3) MG/3ML SOLN INHALE 1 VIAL VIA NEBULIZER EVERY FOUR HOURS AS NEEDED. 03/02/16   Fayrene Helper, MD  ipratropium-albuterol (DUONEB) 0.5-2.5 (3) MG/3ML SOLN USE 1 VIAL(3 ML) IN NEBULIZER EVERY 4 TO 6 HOURS 03/26/18   Fayrene Helper, MD  olmesartan (BENICAR) 40 MG tablet Take 1 tablet (40 mg total) by mouth daily. 12/04/17   Fayrene Helper, MD  polyethylene glycol powder (GLYCOLAX/MIRALAX) powder MIX 1 CAPFUL (17G) IN 8 OUNCES OF JUICE/WATER AND DRINK ONCE DAILY. Patient taking differently: MIX 1 CAPFUL (17G) IN 8 OUNCES OF JUICE/WATER AND DRINK ONCE DAILY AS NEEDED 12/23/15   Fayrene Helper, MD  pravastatin (PRAVACHOL) 20 MG  tablet TAKE (1) TABLET BY MOUTH AT BEDTIME FOR CHOLESTEROL. 12/04/17   Fayrene Helper, MD  predniSONE (DELTASONE) 20 MG tablet 3 tabs po day one, then 2 po daily x 4 days 02/26/18   Julianne Rice, MD  predniSONE (DELTASONE) 20 MG tablet 2 tabs po daily x 4 days 02/27/18   Orlie Dakin, MD  PROAIR HFA 108 [phone] Base) MCG/ACT inhaler INHALE 2 PUFFS INTO THE LUNGS EVERY 4 HOURS AS NEEDED FOR WHEEZING ORSHORTNESS OF BREATH. 01/31/18   Raylene Everts, MD  risperiDONE (RISPERDAL) 1 MG tablet TAKE (1) TABLET BY MOUTH AT BEDTIME WITH '4MG'$ . 03/12/18   Fayrene Helper, MD  risperidone (RISPERDAL) 4 MG tablet TAKE (1) TABLET BY MOUTH AT BEDTIME. Patient not taking: Reported on 02/26/2018 01/07/18   Fayrene Helper, MD  SYMBICORT 80-4.5 MCG/ACT inhaler INHALE 2 PUFFS INTO LUNGS TWICE DAILY. 03/21/18   Fayrene Helper, MD  tiotropium (SPIRIVA) 18 MCG inhalation capsule Place 1 capsule (18 mcg total) into inhaler and inhale daily. 04/17/17   Fayrene Helper, MD  traZODone (DESYREL) 50 MG tablet TAKE ONE TABLET BY  MOUTH AT BEDTIME AS NEEDED FOR SLEEP. 10/23/17   Fayrene Helper, MD  triamterene-hydrochlorothiazide (MAXZIDE-25) 37.5-25 MG tablet TAKE (1/2) TABLET BY MOUTH ONCE DAILY. 12/04/17   Fayrene Helper, MD  sildenafil (VIAGRA) 100 MG tablet Take 100 mg by mouth. Take one tablet 30 mins before intercourse   03/11/12  [provider]    Physical Exam: Vitals:   03/29/18 2130 03/29/18 2221 03/29/18 2230 03/29/18 2258  BP: 118/90  121/89 127/88  Pulse: (!) 112  (!) 109 (!) 107  Resp: (!) 25  19 (!) 22  Temp:    97.6 F (36.4 C)  TempSrc:    Oral  SpO2: 92% 95% 91% 94%  Weight:    105.6 kg (232 lb 12.8 oz)  Height:        Constitutional: NAD, calm, comfortable Eyes: PERRL, lids and conjunctivae normal ENMT: Nasal cannula in place.  Mucous membranes are moist. Posterior pharynx clear of any exudate or lesions. Neck: normal, supple, no masses, no thyromegaly Respiratory: Decreased breath sounds with mild rhonchi and wheezing bilaterally, no crackles. Normal respiratory effort. No accessory muscle use.  Cardiovascular: Tachycardic at 102 bpm, occasional extrasystole, no murmurs / rubs / gallops. No extremity edema. 2+ pedal pulses. No carotid bruits.  Abdomen: Soft, no tenderness, no masses palpated. No hepatosplenomegaly. Bowel sounds positive.  Musculoskeletal: no clubbing / cyanosis. Good ROM, no contractures. Normal muscle tone.  Skin: no rashes, lesions, ulcers on limited skin exam. Neurologic: CN 2-12 grossly intact. Sensation intact, DTR normal. Strength 5/5 in all 4.  Psychiatric: Alert and oriented x 3.     Labs on Admission: I have personally reviewed following labs and imaging studies  CBC: Recent Labs  Lab 03/29/18 2220  WBC 7.7  NEUTROABS 3.3  HGB 12.9*  HCT 39.9  MCV 89.1  PLT 119   Basic Metabolic Panel: Recent Labs  Lab 03/29/18 1944 03/29/18 2220  NA 130*  --   K 3.6  --   CL 95*  --   CO2 23  --   GLUCOSE 97  --   BUN 11  --   CREATININE  1.62*  --   CALCIUM 10.0  --   MG  --  2.0  PHOS  --  3.3   GFR: Estimated Creatinine Clearance: 55.7 mL/min (A) (by C-G formula based on SCr of 1.62  mg/dL (H)). Liver Function Tests: No results for input(s): AST, ALT, ALKPHOS, BILITOT, PROT, ALBUMIN in the last 168 hours. No results for input(s): LIPASE, AMYLASE in the last 168 hours. No results for input(s): AMMONIA in the last 168 hours. Coagulation Profile: No results for input(s): INR, PROTIME in the last 168 hours. Cardiac Enzymes: Recent Labs  Lab 03/29/18 1944  TROPONINI <0.03   BNP (last 3 results) No results for input(s): PROBNP in the last 8760 hours. HbA1C: No results for input(s): HGBA1C in the last 72 hours. CBG: No results for input(s): GLUCAP in the last 168 hours. Lipid Profile: No results for input(s): CHOL, HDL, LDLCALC, TRIG, CHOLHDL, LDLDIRECT in the last 72 hours. Thyroid Function Tests: No results for input(s): TSH, T4TOTAL, FREET4, T3FREE, THYROIDAB in the last 72 hours. Anemia Panel: No results for input(s): VITAMINB12, FOLATE, FERRITIN, TIBC, IRON, RETICCTPCT in the last 72 hours. Urine analysis:    Component Value Date/Time   COLORURINE lt. yellow 02/25/2009 1257   APPEARANCEUR Clear 02/25/2009 1257   LABSPEC 1.010 02/25/2009 1257   PHURINE 6.0 02/25/2009 1257   HGBUR negative 02/25/2009 1257   BILIRUBINUR negative 02/25/2009 1257   UROBILINOGEN 0.2 02/25/2009 1257   NITRITE negative 02/25/2009 1257    Radiological Exams on Admission: Dg Chest Port 1 View  Result Date: 03/29/2018 CLINICAL DATA:  Productive cough and shortness of breath EXAM: PORTABLE CHEST 1 VIEW COMPARISON:  02/26/2018 FINDINGS: Normal heart size. Mild aortic tortuosity. Low lung volumes. There is no edema, consolidation, effusion, or pneumothorax. IMPRESSION: No evidence of active disease. Electronically Signed   By: Monte Fantasia M.D.   On: 03/29/2018 20:05   08/16/2015 pulmonary function test   Dx:  COPD exacerbation  (HCC)   Ref Range & Units 13yrago  FVC-Pre L 2.89   FVC-%Pred-Pre % 66   FVC-Post L 3.32   FVC-%Pred-Post % 75   FVC-%Change-Post % 14   FEV1-Pre L 2.27   FEV1-%Pred-Pre % 67   FEV1-Post L 2.64   FEV1-%Pred-Post % 78   FEV1-%Change-Post % 16   FEV6-Pre L 2.89   FEV6-%Pred-Pre % 68   FEV6-Post L 3.32   FEV6-%Pred-Post % 78   FEV6-%Change-Post % 14   Pre FEV1/FVC ratio % 78   FEV1FVC-%Pred-Pre % 101   Post FEV1/FVC ratio % 80   FEV1FVC-%Change-Post % 1   Pre FEV6/FVC Ratio % 100   FEV6FVC-%Pred-Pre % 104   Post FEV6/FVC ratio % 100   FEV6FVC-%Pred-Post % 104   FEF 25-75 Pre L/sec 2.06   FEF2575-%Pred-Pre % 69   FEF 25-75 Post L/sec 3.49   FEF2575-%Pred-Post % 117   FEF2575-%Change-Post % 69   RV L 2.92   RV % pred % 116   TLC L 5.64   TLC % pred % 74   DLCO unc ml/min/mmHg 23.44   DLCO unc % pred % 64   DL/VA ml/min/mmHg/L 5.11   DL/VA % pred % 107             02/11/2018 NM Myocar Multi W/Spect W/Wall Motion / EF   There was no ST segment deviation noted during stress.  Defect 1: There is a small defect of mild severity present in the mid inferior and mid inferolateral location. This may represent a small ischemic territory vs variable soft tissue attenuation.  Overall, this is a low risk study.  Nuclear stress EF: 65%.   EKG: Independently reviewed. Vent. rate 111 BPM PR interval * ms QRS duration 97 ms QT/QTc  340/473 ms P-R-T axes 56 -86 76 Sinus tachycardia with irregular rate Left anterior fascicular block Low voltage, precordial leads Consider right ventricular hypertrophy Nonspecific repol abnormality, lateral leads Baseline wander in lead(s) I III aVL   Assessment/Plan Principal Problem:   COPD exacerbation (HCC) Observation/telemetry. Continue supplemental oxygen. Albuterol 2.5+ ipratropium 0.5 mg via neb every 6 hours/4 times daily. Albuterol 2.5 mg every 4 hours as needed. Magnesium sulfate supplementation was given. Continue  Solu-Medrol 60 mg IVP every 6 hours with close CBG monitoring.  Active Problems:   Hyperlipidemia Continue pravastatin 20 mg p.o. daily. Monitor LFTs. Fasting lipid panel follow-up as an outpatient.    Diabetes mellitus type 2 in obese (HCC) Last hemoglobin A1c was 6.  8 in December 04, 2017. Carbohydrate modified diet. Continue glipizide XL 5 mg p.o. daily with breakfast. CBG monitoring with regular insulin sliding scale in the hospital    Schizophrenia, unspecified type ([place]) Denies visual or auditory hallucinations. Continue risperidone 1 mg p.o. at bedtime.    GAD (generalized anxiety disorder) Continue buspirone 7.5 mg p.o. 3 times daily.    Coronary atherosclerosis of native coronary artery Negative stress test in February of this year. Continue aspirin and pravastatin. Tobacco cessation encouraged.    Multiple lung nodules on CT Walking cessation encouraged. Continue interval surveillance.    Hypertension Continue Benicar 40 mg p.o. daily or pharmacy's formulary equivalent. Not sure he is still taking the half a tablet of Maxzide.  Will hold for now. I asked him to check his relatives to bring his medications to the hospital. Monitor blood pressure, renal function and electrolytes.    GERD (gastroesophageal reflux disease) Protonix 40 mg p.o. daily.    Obesity (BMI 30.0-34.9) Needs lifestyle modifications.    DVT prophylaxis: Lovenox SQ. Code Status: Full code. Family Communication:  Disposition Plan: Observation for 24-48 hours for COPD exacerbation treatment. Consults called:   Admission status: Observation/telemetry.   Reubin Milan MD [facility] Pager [phone].  If 7PM-7AM, please contact night-coverage [email or web address] Password [facility]  03/29/2018, 11:31 PM

## 2018-03-29 NOTE — ED Triage Notes (Signed)
Pt called out for SOB since Wednesday, hx of COPD. Albuterol neb given by EMS, pt states some improvement. Denies CP

## 2018-03-30 ENCOUNTER — Encounter (HOSPITAL_COMMUNITY): Payer: Self-pay | Admitting: Internal Medicine

## 2018-03-30 DIAGNOSIS — E785 Hyperlipidemia, unspecified: Secondary | ICD-10-CM

## 2018-03-30 DIAGNOSIS — J441 Chronic obstructive pulmonary disease with (acute) exacerbation: Secondary | ICD-10-CM | POA: Diagnosis not present

## 2018-03-30 DIAGNOSIS — E1169 Type 2 diabetes mellitus with other specified complication: Secondary | ICD-10-CM | POA: Diagnosis not present

## 2018-03-30 DIAGNOSIS — E669 Obesity, unspecified: Secondary | ICD-10-CM | POA: Diagnosis not present

## 2018-03-30 LAB — BASIC METABOLIC PANEL
Anion gap: 14 (ref 5–15)
BUN: 15 mg/dL (ref 6–20)
CO2: 19 mmol/L — ABNORMAL LOW (ref 22–32)
Calcium: 10.1 mg/dL (ref 8.9–10.3)
Chloride: 96 mmol/L — ABNORMAL LOW (ref 101–111)
Creatinine, Ser: 1.63 mg/dL — ABNORMAL HIGH (ref 0.61–1.24)
GFR calc Af Amer: 48 mL/min — ABNORMAL LOW (ref >60)
GFR calc non Af Amer: 42 mL/min — ABNORMAL LOW (ref >60)
Glucose, Bld: 212 mg/dL — ABNORMAL HIGH (ref 65–99)
Potassium: 4.5 mmol/L (ref 3.5–5.1)
Sodium: 129 mmol/L — ABNORMAL LOW (ref 135–145)

## 2018-03-30 LAB — GLUCOSE, CAPILLARY
Glucose-Capillary: 206 mg/dL — ABNORMAL HIGH (ref 65–99)
Glucose-Capillary: 176 mg/dL — ABNORMAL HIGH (ref 65–99)
Glucose-Capillary: 240 mg/dL — ABNORMAL HIGH (ref 65–99)
Glucose-Capillary: 180 mg/dL — ABNORMAL HIGH (ref 65–99)

## 2018-03-30 MED ORDER — GUAIFENESIN-DM 100-10 MG/5ML PO SYRP
5.0000 mL | ORAL_SOLUTION | ORAL | Status: DC | PRN
  Administered 2018-03-30 (×4): 5 mL via ORAL
  Filled 2018-03-30 (×5): qty 5

## 2018-03-30 MED ORDER — BUSPIRONE HCL 5 MG PO TABS
7.5000 mg | ORAL_TABLET | Freq: Three times a day (TID) | ORAL | Status: DC
  Administered 2018-03-30 – 2018-03-31 (×4): 7.5 mg via ORAL
  Filled 2018-03-30 (×4): qty 2

## 2018-03-30 MED ORDER — PRAVASTATIN SODIUM 10 MG PO TABS
20.0000 mg | ORAL_TABLET | Freq: Every day | ORAL | Status: DC
  Administered 2018-03-30: 20 mg via ORAL
  Filled 2018-03-30: qty 2

## 2018-03-30 MED ORDER — GLIPIZIDE ER 5 MG PO TB24
5.0000 mg | ORAL_TABLET | Freq: Every day | ORAL | Status: DC
  Administered 2018-03-30 – 2018-03-31 (×2): 5 mg via ORAL
  Filled 2018-03-30 (×2): qty 1

## 2018-03-30 MED ORDER — PANTOPRAZOLE SODIUM 40 MG PO TBEC
40.0000 mg | DELAYED_RELEASE_TABLET | Freq: Every day | ORAL | Status: DC
  Administered 2018-03-30 – 2018-03-31 (×2): 40 mg via ORAL
  Filled 2018-03-30 (×2): qty 1

## 2018-03-30 MED ORDER — AZITHROMYCIN 250 MG PO TABS
500.0000 mg | ORAL_TABLET | Freq: Every day | ORAL | Status: AC
Start: 2018-03-30 — End: 2018-03-30
  Administered 2018-03-30: 500 mg via ORAL
  Filled 2018-03-30: qty 2

## 2018-03-30 MED ORDER — IRBESARTAN 300 MG PO TABS
300.0000 mg | ORAL_TABLET | Freq: Every day | ORAL | Status: DC
  Administered 2018-03-30 – 2018-03-31 (×2): 300 mg via ORAL
  Filled 2018-03-30 (×2): qty 1

## 2018-03-30 MED ORDER — NICOTINE 21 MG/24HR TD PT24
21.0000 mg | MEDICATED_PATCH | Freq: Every day | TRANSDERMAL | Status: DC
  Administered 2018-03-30 – 2018-03-31 (×2): 21 mg via TRANSDERMAL
  Filled 2018-03-30 (×2): qty 1

## 2018-03-30 MED ORDER — AZITHROMYCIN 250 MG PO TABS
250.0000 mg | ORAL_TABLET | Freq: Every day | ORAL | Status: DC
  Administered 2018-03-31: 250 mg via ORAL
  Filled 2018-03-30: qty 1

## 2018-03-30 MED ORDER — IPRATROPIUM-ALBUTEROL 0.5-2.5 (3) MG/3ML IN SOLN
3.0000 mL | Freq: Four times a day (QID) | RESPIRATORY_TRACT | Status: DC
  Administered 2018-03-30 – 2018-03-31 (×7): 3 mL via RESPIRATORY_TRACT
  Filled 2018-03-30 (×6): qty 3

## 2018-03-30 MED ORDER — BENZONATATE 100 MG PO CAPS
100.0000 mg | ORAL_CAPSULE | Freq: Two times a day (BID) | ORAL | Status: DC | PRN
  Administered 2018-03-30 – 2018-03-31 (×3): 100 mg via ORAL
  Filled 2018-03-30 (×3): qty 1

## 2018-03-30 MED ORDER — ALBUTEROL SULFATE (2.5 MG/3ML) 0.083% IN NEBU
2.5000 mg | INHALATION_SOLUTION | RESPIRATORY_TRACT | Status: DC | PRN
Start: 2018-03-30 — End: 2018-03-31
  Administered 2018-03-30 – 2018-03-31 (×2): 2.5 mg via RESPIRATORY_TRACT
  Filled 2018-03-30 (×2): qty 3

## 2018-03-30 MED ORDER — RISPERIDONE 1 MG PO TABS
1.0000 mg | ORAL_TABLET | Freq: Every day | ORAL | Status: DC
  Administered 2018-03-30: 1 mg via ORAL
  Filled 2018-03-30: qty 1

## 2018-03-30 MED ORDER — DOCUSATE SODIUM 100 MG PO CAPS
100.0000 mg | ORAL_CAPSULE | Freq: Every day | ORAL | Status: DC
  Administered 2018-03-30 – 2018-03-31 (×2): 100 mg via ORAL
  Filled 2018-03-30 (×2): qty 1

## 2018-03-30 MED ORDER — ASPIRIN EC 81 MG PO TBEC
81.0000 mg | DELAYED_RELEASE_TABLET | Freq: Every day | ORAL | Status: DC
  Administered 2018-03-30 – 2018-03-31 (×2): 81 mg via ORAL
  Filled 2018-03-30 (×2): qty 1

## 2018-03-30 NOTE — Progress Notes (Addendum)
[facility] PROGRESS NOTE  Matthew Molina IFO:[id number] DOB: 06/05/1951 DOA: 03/29/2018 PCP: Fayrene Helper, MD  Brief summary  67 y.o. male with medical history significant of AKI, episode of acute respiratory failure with hypoxia, osteoarthritis, COPD, depression, type 2 diabetes, hyperlipidemia, hypertension, lung nodules, tobacco use, obesity, schizophrenia who is coming to the emergency department with complaints of shortness of breath, admitted with copd exacerbation .  ED Course: Initial vital signs temperature 36.9C (98.5 F), pulse 108, respirations 21, blood pressure 132/94 and O2 sat 94% on room air.  He received supplemental oxygen, Solu-Medrol 125 mg IVP and a 10 mg albuterol continuous nebulization.  He states he feels better.    Assessment/Plan:  COPD exacerbation ([place]). Still dyspnea, cough, wheezing. Continue supplemental oxygen. Continue Solu-Medrol 60 mg IVP every 6 hours, scheduled/prn bronchodilators, add azithromycin. Counseled to stop smoking.  Monitor   Active Problems:    Hyperlipidemia. Continue pravastatin 20 mg p.o. Daily.    Diabetes mellitus type 2 in obese (HCC) Last hemoglobin A1c was 6.  8 in December 04, 2017. Carbohydrate modified diet. Continue glipizide XL 5 mg p.o. daily with breakfast. CBG monitoring with regular insulin sliding scale in the hospital while on steroids     Schizophrenia, unspecified type ([place]) Denies visual or auditory hallucinations. Continue risperidone 1 mg p.o. at bedtime.    GAD (generalized anxiety disorder) Continue buspirone 7.5 mg p.o. 3 times daily.    Coronary atherosclerosis of native coronary artery Negative stress test in February of this year. Continue aspirin and pravastatin. Tobacco cessation encouraged.    Multiple lung nodules on CT smoking cessation encouraged. Continue interval surveillance.    Hypertension Continue Benicar 40 mg p.o. daily or pharmacy's formulary equivalent. ? on Maxzide.      GERD (gastroesophageal reflux disease) Protonix 40 mg p.o. daily.    Obesity (BMI 30.0-34.9) Needs lifestyle modifications.     Hyponatremia. Chronic. Possible cough/pulmoanry. will recheck labs AM Code Status: full Family Communication: d/w patient, RN (indicate person spoken with, relationship, and if by phone, the number) Disposition Plan: needs inpatient treatment for 24-48 hrs    Consultants:  none  Procedures:  none  Antibiotics: Antibiotics Given (last 72 hours)    None        (indicate start date, and stop date if known)  HPI/Subjective: Alert. Coughing. Dyspnea. No acute chest pains   Objective: Vitals:   03/30/18 0800 03/30/18 1033  BP:    Pulse:    Resp:    Temp:    SpO2: 96% (!) 89%    Intake/Output Summary (Last 24 hours) at 03/30/2018 1144 Last data filed at 03/30/2018 0537 Gross per 24 hour  Intake 579.17 ml  Output 1000 ml  Net -420.83 ml   Filed Weights   03/29/18 1933 03/29/18 2258  Weight: 108.9 kg (240 lb) 105.6 kg (232 lb 12.8 oz)    Exam:   General:  Mild dyspnea   Cardiovascular: s1,s2 rrr  Respiratory: poor ventilation   Abdomen: soft, nt   Musculoskeletal: no leg edema    Data Reviewed: Basic Metabolic Panel: Recent Labs  Lab 03/29/18 1944 03/29/18 2220 03/30/18 0647  NA 130*  --  129*  K 3.6  --  4.5  CL 95*  --  96*  CO2 23  --  19*  GLUCOSE 97  --  212*  BUN 11  --  15  CREATININE 1.62*  --  1.63*  CALCIUM 10.0  --  10.1  MG  --  2.0  --   PHOS  --  3.3  --    Liver Function Tests: No results for input(s): AST, ALT, ALKPHOS, BILITOT, PROT, ALBUMIN in the last 168 hours. No results for input(s): LIPASE, AMYLASE in the last 168 hours. No results for input(s): AMMONIA in the last 168 hours. CBC: Recent Labs  Lab 03/29/18 2220  WBC 7.7  NEUTROABS 3.3  HGB 12.9*  HCT 39.9  MCV 89.1  PLT 252   Cardiac Enzymes: Recent Labs  Lab 03/29/18 1944  TROPONINI <0.03   BNP (last 3 results) Recent  Labs    03/29/18 1944  BNP 17.0    ProBNP (last 3 results) No results for input(s): PROBNP in the last 8760 hours.  CBG: Recent Labs  Lab 03/30/18 0759  GLUCAP 206*    No results found for this or any previous visit (from the past 240 hour(s)).   Studies: Dg Chest Port 1 View  Result Date: 03/29/2018 CLINICAL DATA:  Productive cough and shortness of breath EXAM: PORTABLE CHEST 1 VIEW COMPARISON:  02/26/2018 FINDINGS: Normal heart size. Mild aortic tortuosity. Low lung volumes. There is no edema, consolidation, effusion, or pneumothorax. IMPRESSION: No evidence of active disease. Electronically Signed   By: Monte Fantasia M.D.   On: 03/29/2018 20:05    Scheduled Meds: . aspirin EC  81 mg Oral Daily  . busPIRone  7.5 mg Oral TID  . docusate sodium  100 mg Oral Daily  . enoxaparin (LOVENOX) injection  40 mg Subcutaneous Q24H  . glipiZIDE  5 mg Oral Q breakfast  . insulin aspart  0-20 Units Subcutaneous TID WC  . ipratropium-albuterol  3 mL Nebulization Q6H  . irbesartan  300 mg Oral Daily  . methylPREDNISolone (SOLU-MEDROL) injection  60 mg Intravenous Q6H  . nicotine  21 mg Transdermal Daily  . pantoprazole  40 mg Oral Daily  . pravastatin  20 mg Oral q1800  . risperiDONE  1 mg Oral QHS   Continuous Infusions:  Principal Problem:   COPD exacerbation (HCC) Active Problems:   Hyperlipidemia   Obesity (BMI 30.0-34.9)   Schizophrenia, unspecified type ([place])   Diabetes mellitus type 2 in obese ([place])   GAD (generalized anxiety disorder)   Coronary atherosclerosis of native coronary artery   Multiple lung nodules on CT   Hypertension   GERD (gastroesophageal reflux disease)    Time spent: >35 minutes     Kinnie Feil  [facility] Pager [phone]. If 7PM-7AM, please contact night-coverage at [email or web address], password [facility] 03/30/2018, 11:44 AM  LOS: 0 days

## 2018-03-30 NOTE — Progress Notes (Signed)
Nutrition Brief Note  Patient remotely screened due to COPD Gold protocol.   Wt Readings from Last 15 Encounters:  03/29/18 232 lb 12.8 oz (105.6 kg)  01/28/18 238 lb (108 kg)  12/04/17 240 lb (108.9 kg)  08/01/17 235 lb 12 oz (106.9 kg)  07/03/17 230 lb (104.3 kg)  05/09/17 234 lb 1.3 oz (106.2 kg)  04/17/17 235 lb (106.6 kg)  04/13/17 234 lb 1.6 oz (106.2 kg)  02/06/17 221 lb 12.5 oz (100.6 kg)  01/06/17 220 lb (99.8 kg)  12/28/16 227 lb (103 kg)  09/29/16 235 lb (106.6 kg)  09/27/16 235 lb 1.3 oz (106.6 kg)  08/24/16 238 lb 12.8 oz (108.3 kg)  07/20/16 247 lb (112 kg)   Body mass index is 30.71 kg/m. Patient meets criteria for Obese based on current BMI.   RD operating remotely on weekends. Pet chart, the patients only complaint has been SOB. No n/v/c/d, abdominal pain, sore throat. There is no mention anywhere of a poor intake.   Per chart his weight has been stable 230-240 for the past year.   Patient is currently under observation status with expected d/c in 24-48 hrs. No nutrition interventions warranted at this time. If nutrition issues arise, please consult RD.   Burtis Junes RD, LDN, CNSC Clinical Nutrition Available Tues-Sat via Pager: [phone] 03/30/2018 9:18 AM

## 2018-03-31 DIAGNOSIS — F209 Schizophrenia, unspecified: Secondary | ICD-10-CM | POA: Diagnosis not present

## 2018-03-31 DIAGNOSIS — J441 Chronic obstructive pulmonary disease with (acute) exacerbation: Secondary | ICD-10-CM

## 2018-03-31 DIAGNOSIS — K219 Gastro-esophageal reflux disease without esophagitis: Secondary | ICD-10-CM | POA: Diagnosis not present

## 2018-03-31 DIAGNOSIS — E1169 Type 2 diabetes mellitus with other specified complication: Secondary | ICD-10-CM | POA: Diagnosis not present

## 2018-03-31 DIAGNOSIS — I10 Essential (primary) hypertension: Secondary | ICD-10-CM | POA: Diagnosis not present

## 2018-03-31 DIAGNOSIS — E669 Obesity, unspecified: Secondary | ICD-10-CM

## 2018-03-31 LAB — GLUCOSE, CAPILLARY
Glucose-Capillary: 180 mg/dL — ABNORMAL HIGH (ref 65–99)
Glucose-Capillary: 135 mg/dL — ABNORMAL HIGH (ref 65–99)

## 2018-03-31 LAB — BASIC METABOLIC PANEL
Anion gap: 11 (ref 5–15)
BUN: 24 mg/dL — ABNORMAL HIGH (ref 6–20)
CO2: 24 mmol/L (ref 22–32)
Calcium: 10.3 mg/dL (ref 8.9–10.3)
Chloride: 97 mmol/L — ABNORMAL LOW (ref 101–111)
Creatinine, Ser: 1.52 mg/dL — ABNORMAL HIGH (ref 0.61–1.24)
GFR calc Af Amer: 53 mL/min — ABNORMAL LOW (ref >60)
GFR calc non Af Amer: 45 mL/min — ABNORMAL LOW (ref >60)
Glucose, Bld: 181 mg/dL — ABNORMAL HIGH (ref 65–99)
Potassium: 4.4 mmol/L (ref 3.5–5.1)
Sodium: 132 mmol/L — ABNORMAL LOW (ref 135–145)

## 2018-03-31 MED ORDER — GUAIFENESIN ER 600 MG PO TB12
1200.0000 mg | ORAL_TABLET | Freq: Two times a day (BID) | ORAL | Status: DC
  Administered 2018-03-31: 1200 mg via ORAL
  Filled 2018-03-31: qty 2

## 2018-03-31 MED ORDER — KETOROLAC TROMETHAMINE 15 MG/ML IJ SOLN
15.0000 mg | Freq: Once | INTRAMUSCULAR | Status: AC
Start: 2018-03-31 — End: 2018-03-31
  Administered 2018-03-31: 15 mg via INTRAVENOUS
  Filled 2018-03-31: qty 1

## 2018-03-31 MED ORDER — GUAIFENESIN-DM 100-10 MG/5ML PO SYRP
10.0000 mL | ORAL_SOLUTION | ORAL | Status: DC | PRN
  Administered 2018-03-31 (×2): 10 mL via ORAL
  Filled 2018-03-31 (×2): qty 10

## 2018-03-31 MED ORDER — PRAVASTATIN SODIUM 20 MG PO TABS: 20.0000 mg | ORAL_TABLET | Freq: Every day | ORAL | 0 refills | Status: DC

## 2018-03-31 MED ORDER — GUAIFENESIN ER 600 MG PO TB12: 600.0000 mg | ORAL_TABLET | Freq: Two times a day (BID) | ORAL | 0 refills | Status: DC

## 2018-03-31 MED ORDER — PROMETHAZINE HCL 25 MG/ML IJ SOLN
12.5000 mg | INTRAMUSCULAR | Status: DC | PRN
  Administered 2018-03-31: 12.5 mg via INTRAVENOUS
  Filled 2018-03-31: qty 1

## 2018-03-31 MED ORDER — AZITHROMYCIN 250 MG PO TABS: 250.0000 mg | ORAL_TABLET | Freq: Every day | ORAL | 0 refills | Status: DC

## 2018-03-31 MED ORDER — MORPHINE SULFATE (PF) 2 MG/ML IV SOLN
2.0000 mg | Freq: Once | INTRAVENOUS | Status: AC
  Administered 2018-03-31: 2 mg via INTRAVENOUS
  Filled 2018-03-31: qty 1

## 2018-03-31 MED ORDER — PREDNISONE 10 MG PO TABS: ORAL_TABLET | ORAL | 0 refills | Status: DC

## 2018-03-31 NOTE — Progress Notes (Signed)
PT continues to have congested, non-productive cough. PT given PRN nebulizing treatment for cough, tightness and wheeze. PRN PO meds given with relief. Continue to monitor.

## 2018-03-31 NOTE — Progress Notes (Signed)
Matthew Molina discharged Home per MD order.  Discharge instructions reviewed and discussed with the patient, all questions and concerns answered. Copy of instructions and scripts given to patient.  Allergies as of 03/31/2018      Reactions   Sertraline Hcl    Stomach upset/pain   Wellbutrin [bupropion] Other (See Comments)   Makes stomach hurt      Medication List    STOP taking these medications   triamterene-hydrochlorothiazide 37.5-25 MG tablet Commonly known as:  MAXZIDE-25     TAKE these medications   ACCU-CHEK AVIVA PLUS test strip Generic drug:  glucose blood USE TO TEST ONCE DAILY   ACCU-CHEK SOFTCLIX LANCETS lancets TEST BLOOD SUGAR ONCE DAILY AS DIRECTED.   aspirin EC 81 MG tablet Take 81 mg by mouth daily.   azithromycin 250 MG tablet Commonly known as:  ZITHROMAX Take 1 tablet (250 mg total) by mouth daily.   benzonatate 100 MG capsule Commonly known as:  TESSALON Take 1 capsule (100 mg total) by mouth 2 (two) times daily as needed for cough.   blood glucose meter kit and supplies Dispense based on patient and insurance preference.Requesting accuchek meter. Once daily testing dx e11.9   busPIRone 7.5 MG tablet Commonly known as:  BUSPAR TAKE 1 TABLET BY MOUTH 3 TIMES DAILY.   glipiZIDE 5 MG 24 hr tablet Commonly known as:  GLIPIZIDE XL Take 1 tablet (5 mg total) by mouth daily with breakfast.   guaiFENesin 600 MG 12 hr tablet Commonly known as:  MUCINEX Take 1 tablet (600 mg total) by mouth 2 (two) times daily.   ipratropium-albuterol 0.5-2.5 (3) MG/3ML Soln Commonly known as:  DUONEB INHALE 1 VIAL VIA NEBULIZER EVERY FOUR HOURS AS NEEDED.   olmesartan 40 MG tablet Commonly known as:  BENICAR Take 1 tablet (40 mg total) by mouth daily.   pravastatin 20 MG tablet Commonly known as:  PRAVACHOL Take 1 tablet (20 mg total) by mouth daily at 6 PM. What changed:    how much to take  how to take this  when to take this  additional  instructions   predniSONE 10 MG tablet Commonly known as:  DELTASONE Take 63m po daily for 2 days then 375mdaily for 2 days then 2070maily for 2 days then 70m23mily for 2 days then stop What changed:    medication strength  additional instructions   PROAIR HFA 108 (90 Base) MCG/ACT inhaler Generic drug:  albuterol INHALE 2 PUFFS INTO THE LUNGS EVERY 4 HOURS AS NEEDED FOR WHEEZING ORSHORTNESS OF BREATH.   risperidone 4 MG tablet Commonly known as:  RISPERDAL TAKE (1) TABLET BY MOUTH AT BEDTIME.   risperiDONE 1 MG tablet Commonly known as:  RISPERDAL TAKE (1) TABLET BY MOUTH AT BEDTIME WITH 4MG.   SYMBICORT 80-4.5 MCG/ACT inhaler Generic drug:  budesonide-formoterol INHALE 2 PUFFS INTO LUNGS TWICE DAILY.   tiotropium 18 MCG inhalation capsule Commonly known as:  SPIRIVA Place 1 capsule (18 mcg total) into inhaler and inhale daily.   traZODone 50 MG tablet Commonly known as:  DESYREL TAKE ONE TABLET BY MOUTH AT BEDTIME AS NEEDED FOR SLEEP.       Patients skin is clean, dry and intact, no evidence of skin break down. IV site discontinued and catheter remains intact. Site without signs and symptoms of complications. Dressing and pressure applied.  Patient escorted to car by NT in a wheelchair,  no distress noted upon discharge.  Matthew Molina 03/31/2018 3:59 PM

## 2018-03-31 NOTE — Discharge Summary (Signed)
Physician Discharge Summary  Matthew Molina TIR:[id number] DOB: 1951-07-04 DOA: 03/29/2018  PCP: Fayrene Helper, MD  Admit date: 03/29/2018 Discharge date: 03/31/2018  Admitted From: Home Disposition: Home  Recommendations for Outpatient Follow-up:  1. Follow up with PCP in 1-2 weeks 2. Please obtain BMP/CBC in one week 3.   Home Health: Equipment/Devices:  Discharge Condition: Stable CODE STATUS: Full code Diet recommendation: Heart Healthy / Carb Modified   Brief/Interim Summary: 67 year old male with a history of COPD, diabetes, hypertension, presented to the hospital with shortness of breath and was admitted for COPD exacerbation.  He was treated with intravenous steroids, bronchodilators and azithromycin.  He was also treated with mucolytic's.  By the day of discharge.  He was feeling back to his baseline.  He was able to ambulate without any oxygen and did not become hypoxic.  He will be continued on prednisone taper.  He will complete his antibiotic course at home.  He already has nebulizer treatments at home.  The patient feels ready for discharge home.  He is been asked to follow-up with his primary care physician.  The remainder of his medical issues remained stable.  Discharge Diagnoses:  Principal Problem:   COPD exacerbation ([place]) Active Problems:   Hyperlipidemia   Obesity (BMI 30.0-34.9)   Schizophrenia, unspecified type ([place])   Diabetes mellitus type 2 in obese ([place])   GAD (generalized anxiety disorder)   Coronary atherosclerosis of native coronary artery   Multiple lung nodules on CT   Hypertension   GERD (gastroesophageal reflux disease)    Discharge Instructions  Discharge Instructions    Diet - low sodium heart healthy   Complete by:  As directed    Increase activity slowly   Complete by:  As directed      Allergies as of 03/31/2018      Reactions   Sertraline Hcl    Stomach upset/pain   Wellbutrin [bupropion] Other (See Comments)   Makes  stomach hurt      Medication List    STOP taking these medications   triamterene-hydrochlorothiazide 37.5-25 MG tablet Commonly known as:  MAXZIDE-25     TAKE these medications   ACCU-CHEK AVIVA PLUS test strip Generic drug:  glucose blood USE TO TEST ONCE DAILY   ACCU-CHEK SOFTCLIX LANCETS lancets TEST BLOOD SUGAR ONCE DAILY AS DIRECTED.   aspirin EC 81 MG tablet Take 81 mg by mouth daily.   azithromycin 250 MG tablet Commonly known as:  ZITHROMAX Take 1 tablet (250 mg total) by mouth daily.   benzonatate 100 MG capsule Commonly known as:  TESSALON Take 1 capsule (100 mg total) by mouth 2 (two) times daily as needed for cough.   blood glucose meter kit and supplies Dispense based on patient and insurance preference.Requesting accuchek meter. Once daily testing dx e11.9   busPIRone 7.5 MG tablet Commonly known as:  BUSPAR TAKE 1 TABLET BY MOUTH 3 TIMES DAILY.   glipiZIDE 5 MG 24 hr tablet Commonly known as:  GLIPIZIDE XL Take 1 tablet (5 mg total) by mouth daily with breakfast.   guaiFENesin 600 MG 12 hr tablet Commonly known as:  MUCINEX Take 1 tablet (600 mg total) by mouth 2 (two) times daily.   ipratropium-albuterol 0.5-2.5 (3) MG/3ML Soln Commonly known as:  DUONEB INHALE 1 VIAL VIA NEBULIZER EVERY FOUR HOURS AS NEEDED.   olmesartan 40 MG tablet Commonly known as:  BENICAR Take 1 tablet (40 mg total) by mouth daily.   pravastatin 20 MG tablet  Commonly known as:  PRAVACHOL Take 1 tablet (20 mg total) by mouth daily at 6 PM. What changed:    how much to take  how to take this  when to take this  additional instructions   predniSONE 10 MG tablet Commonly known as:  DELTASONE Take '40mg'$  po daily for 2 days then '30mg'$  daily for 2 days then '20mg'$  daily for 2 days then '10mg'$  daily for 2 days then stop What changed:    medication strength  additional instructions   PROAIR HFA 108 (90 Base) MCG/ACT inhaler Generic drug:  albuterol INHALE 2 PUFFS  INTO THE LUNGS EVERY 4 HOURS AS NEEDED FOR WHEEZING ORSHORTNESS OF BREATH.   risperidone 4 MG tablet Commonly known as:  RISPERDAL TAKE (1) TABLET BY MOUTH AT BEDTIME.   risperiDONE 1 MG tablet Commonly known as:  RISPERDAL TAKE (1) TABLET BY MOUTH AT BEDTIME WITH '4MG'$ .   SYMBICORT 80-4.5 MCG/ACT inhaler Generic drug:  budesonide-formoterol INHALE 2 PUFFS INTO LUNGS TWICE DAILY.   tiotropium 18 MCG inhalation capsule Commonly known as:  SPIRIVA Place 1 capsule (18 mcg total) into inhaler and inhale daily.   traZODone 50 MG tablet Commonly known as:  DESYREL TAKE ONE TABLET BY MOUTH AT BEDTIME AS NEEDED FOR SLEEP.       Allergies  Allergen Reactions  . Sertraline Hcl     Stomach upset/pain  . Wellbutrin [Bupropion] Other (See Comments)    Makes stomach hurt    Consultations:     Procedures/Studies: Dg Chest Port 1 View  Result Date: 03/29/2018 CLINICAL DATA:  Productive cough and shortness of breath EXAM: PORTABLE CHEST 1 VIEW COMPARISON:  02/26/2018 FINDINGS: Normal heart size. Mild aortic tortuosity. Low lung volumes. There is no edema, consolidation, effusion, or pneumothorax. IMPRESSION: No evidence of active disease. Electronically Signed   By: Monte Fantasia M.D.   On: 03/29/2018 20:05       Subjective: Wheezing has improved.  Shortness of breath is better.  Has productive cough.  Discharge Exam: Vitals:   03/31/18 1403 03/31/18 1458  BP: 115/65   Pulse: 89   Resp: 20   Temp: 98 F (36.7 C)   SpO2: 97% 99%   Vitals:   03/31/18 0428 03/31/18 0551 03/31/18 1403 03/31/18 1458  BP:  116/60 115/65   Pulse:  83 89   Resp:  13 20   Temp:  97.8 F (36.6 C) 98 F (36.7 C)   TempSrc:  Oral    SpO2: 95% 98% 97% 99%  Weight:      Height:        General: Pt is alert, awake, not in acute distress Cardiovascular: RRR, S1/S2 +, no rubs, no gallops Respiratory: CTA bilaterally, no wheezing, no rhonchi Abdominal: Soft, NT, ND, bowel sounds  + Extremities: no edema, no cyanosis    The results of significant diagnostics from this hospitalization (including imaging, microbiology, ancillary and laboratory) are listed below for reference.     Microbiology: No results found for this or any previous visit (from the past 240 hour(s)).   Labs: BNP (last 3 results) Recent Labs    03/29/18 1944  BNP 35.0   Basic Metabolic Panel: Recent Labs  Lab 03/29/18 1944 03/29/18 2220 03/30/18 0647 03/31/18 0654  NA 130*  --  129* 132*  K 3.6  --  4.5 4.4  CL 95*  --  96* 97*  CO2 23  --  19* 24  GLUCOSE 97  --  212* 181*  BUN  11  --  15 24*  CREATININE 1.62*  --  1.63* 1.52*  CALCIUM 10.0  --  10.1 10.3  MG  --  2.0  --   --   PHOS  --  3.3  --   --    Liver Function Tests: No results for input(s): AST, ALT, ALKPHOS, BILITOT, PROT, ALBUMIN in the last 168 hours. No results for input(s): LIPASE, AMYLASE in the last 168 hours. No results for input(s): AMMONIA in the last 168 hours. CBC: Recent Labs  Lab 03/29/18 2220  WBC 7.7  NEUTROABS 3.3  HGB 12.9*  HCT 39.9  MCV 89.1  PLT 252   Cardiac Enzymes: Recent Labs  Lab 03/29/18 1944  TROPONINI <0.03   BNP: Invalid input(s): POCBNP CBG: Recent Labs  Lab 03/30/18 1201 03/30/18 1631 03/30/18 2028 03/31/18 0749 03/31/18 1129  GLUCAP 176* 240* 180* 180* 135*   D-Dimer No results for input(s): DDIMER in the last 72 hours. Hgb A1c No results for input(s): HGBA1C in the last 72 hours. Lipid Profile No results for input(s): CHOL, HDL, LDLCALC, TRIG, CHOLHDL, LDLDIRECT in the last 72 hours. Thyroid function studies No results for input(s): TSH, T4TOTAL, T3FREE, THYROIDAB in the last 72 hours.  Invalid input(s): FREET3 Anemia work up No results for input(s): VITAMINB12, FOLATE, FERRITIN, TIBC, IRON, RETICCTPCT in the last 72 hours. Urinalysis    Component Value Date/Time   COLORURINE lt. yellow 02/25/2009 1257   APPEARANCEUR Clear 02/25/2009 1257   LABSPEC  1.010 02/25/2009 1257   PHURINE 6.0 02/25/2009 1257   HGBUR negative 02/25/2009 1257   BILIRUBINUR negative 02/25/2009 1257   UROBILINOGEN 0.2 02/25/2009 1257   NITRITE negative 02/25/2009 1257   Sepsis Labs Invalid input(s): PROCALCITONIN,  WBC,  LACTICIDVEN Microbiology No results found for this or any previous visit (from the past 240 hour(s)).   Time coordinating discharge: 35 minutes  SIGNED:   Kathie Dike, MD  [facility] 03/31/2018, 6:03 PM Pager   If 7PM-7AM, please contact night-coverage [email or web address] Password [email or web address]

## 2018-04-01 ENCOUNTER — Telehealth: Payer: Self-pay | Admitting: Family Medicine

## 2018-04-01 ENCOUNTER — Telehealth: Payer: Self-pay

## 2018-04-01 DIAGNOSIS — J449 Chronic obstructive pulmonary disease, unspecified: Secondary | ICD-10-CM | POA: Diagnosis not present

## 2018-04-01 NOTE — Telephone Encounter (Signed)
Needs to schedule appt for TOC no later than 04/12/18 - lmom

## 2018-04-01 NOTE — Telephone Encounter (Signed)
Transition Care Management Follow-up Telephone Call   Date discharged?  03/31/2018              How have you been since you were released from the hospital? good/ feels better   Do you understand why you were in the hospital? Breathing problems and coughing   Do you understand the discharge instructions? Yes    Where were you discharged to?  home   Items Reviewed:  Medications reviewed: reviewed  Allergies reviewed: reviewed  Dietary changes reviewed: same diet as before being admitted/low sodium heart healthy  Referrals reviewed: reviewed/ none   Functional Questionnaire:   Activities of Daily Living (ADLs): doesn't have any trouble but has someone to help if he has any issues.   Any transportation issues/concerns?: gets [facility] to bring to appts   Any patient concerns?  no other concerns.    Confirmed importance and date/time of follow-up visits scheduled yes. Will call back with date and time of follow up appt.    Confirmed with patient if condition begins to worsen call PCP or go to the ER.  Patient was given the office number and encouraged to call back with question or concerns.  :  yes

## 2018-04-01 NOTE — Progress Notes (Signed)
Pt called to nurses station to say his prescriptions had not been called in to [place] after being d/c'd yesterday. AVS shows scripts were electronically sent. Notified Dr. Roderic Palau who will call scripts in to pharmacy. Instructed pt to call pharmacy in 1 hour to confirm scripts.

## 2018-04-02 NOTE — Telephone Encounter (Signed)
LMOM that we had appts on 4-22, to please call the office

## 2018-04-04 ENCOUNTER — Ambulatory Visit: Payer: Self-pay | Admitting: Family Medicine

## 2018-04-12 ENCOUNTER — Other Ambulatory Visit: Payer: Self-pay | Admitting: Family Medicine

## 2018-04-16 ENCOUNTER — Ambulatory Visit (INDEPENDENT_AMBULATORY_CARE_PROVIDER_SITE_OTHER): Payer: Medicare Other | Admitting: Family Medicine

## 2018-04-16 ENCOUNTER — Other Ambulatory Visit: Payer: Self-pay

## 2018-04-16 ENCOUNTER — Encounter: Payer: Self-pay | Admitting: Family Medicine

## 2018-04-16 VITALS — BP 130/84 | HR 96 | Ht 73.0 in | Wt 244.1 lb

## 2018-04-16 DIAGNOSIS — I150 Renovascular hypertension: Secondary | ICD-10-CM | POA: Diagnosis not present

## 2018-04-16 DIAGNOSIS — Z1211 Encounter for screening for malignant neoplasm of colon: Secondary | ICD-10-CM | POA: Diagnosis not present

## 2018-04-16 DIAGNOSIS — E1169 Type 2 diabetes mellitus with other specified complication: Secondary | ICD-10-CM | POA: Diagnosis not present

## 2018-04-16 DIAGNOSIS — E669 Obesity, unspecified: Secondary | ICD-10-CM

## 2018-04-16 DIAGNOSIS — J441 Chronic obstructive pulmonary disease with (acute) exacerbation: Secondary | ICD-10-CM

## 2018-04-16 DIAGNOSIS — Z122 Encounter for screening for malignant neoplasm of respiratory organs: Secondary | ICD-10-CM

## 2018-04-16 DIAGNOSIS — Z720 Tobacco use: Secondary | ICD-10-CM

## 2018-04-16 DIAGNOSIS — J432 Centrilobular emphysema: Secondary | ICD-10-CM | POA: Diagnosis not present

## 2018-04-16 DIAGNOSIS — E785 Hyperlipidemia, unspecified: Secondary | ICD-10-CM | POA: Diagnosis not present

## 2018-04-16 MED ORDER — ALBUTEROL SULFATE HFA 108 (90 BASE) MCG/ACT IN AERS: 2.0000 | INHALATION_SPRAY | Freq: Four times a day (QID) | RESPIRATORY_TRACT | 6 refills | Status: DC | PRN

## 2018-04-16 MED ORDER — ALBUTEROL SULFATE HFA 108 (90 BASE) MCG/ACT IN AERS: INHALATION_SPRAY | RESPIRATORY_TRACT | 0 refills | Status: DC

## 2018-04-16 MED ORDER — BUDESONIDE-FORMOTEROL FUMARATE 160-4.5 MCG/ACT IN AERO: 2.0000 | INHALATION_SPRAY | Freq: Two times a day (BID) | RESPIRATORY_TRACT | 12 refills | Status: DC

## 2018-04-16 MED ORDER — METHYLPREDNISOLONE ACETATE 80 MG/ML IJ SUSP
80.0000 mg | Freq: Once | INTRAMUSCULAR | Status: AC
Start: 2018-04-16 — End: 2018-04-17
  Administered 2018-04-17: 80 mg via INTRAMUSCULAR

## 2018-04-16 MED ORDER — PREDNISONE 5 MG (21) PO TBPK: 5.0000 mg | ORAL_TABLET | ORAL | 0 refills | Status: DC

## 2018-04-16 NOTE — Patient Instructions (Addendum)
Wellness visit due in May or after with nurse , please schedule    You will be referred for screening chest scan for lung cancer past due  You will be set up for home stool testing  Increased dose of symbicort  Continue to stay on the nicotine patch, and DO NOT SMOKE     Depo medrol 80 mg IM in office for COPD flare and prednisone dose pack prescribed    You need to get fasting lipid, cmp and eGFR, hBA1C, cbc in the next week

## 2018-04-16 NOTE — Assessment & Plan Note (Addendum)
Currently having a f;lare Depo medrol 80 mg iM in office followed by a prednisone dose pack Symbicort dose is increased also

## 2018-04-17 DIAGNOSIS — J441 Chronic obstructive pulmonary disease with (acute) exacerbation: Secondary | ICD-10-CM | POA: Diagnosis not present

## 2018-04-17 DIAGNOSIS — I150 Renovascular hypertension: Secondary | ICD-10-CM | POA: Diagnosis not present

## 2018-04-17 MED ORDER — METHYLPREDNISOLONE ACETATE 80 MG/ML IJ SUSP: 80.0000 mg | Freq: Once | INTRAMUSCULAR | Status: DC

## 2018-04-21 ENCOUNTER — Encounter: Payer: Self-pay | Admitting: Family Medicine

## 2018-04-21 NOTE — Assessment & Plan Note (Signed)
Depo Medrol 870 mg Im in the office followed by short course of oral prednisone

## 2018-04-21 NOTE — Assessment & Plan Note (Signed)
Hyperlipidemia:Low fat diet discussed and encouraged.   Lipid Panel  Lab Results  Component Value Date   CHOL 164 07/05/2017   HDL 36 (L) 07/05/2017   LDLCALC 91 07/05/2017   TRIG 183 (H) 07/05/2017   CHOLHDL 4.6 07/05/2017   Uncontrolled and update lab is past due

## 2018-04-21 NOTE — Assessment & Plan Note (Signed)
Matthew Molina is reminded of the importance of commitment to daily physical activity for 30 minutes or more, as able and the need to limit carbohydrate intake to 30 to 60 grams per meal to help with blood sugar control.   The need to take medication as prescribed, test blood sugar as directed, and to call between visits if there is a concern that blood sugar is uncontrolled is also discussed.   Matthew Molina is reminded of the importance of daily foot exam, annual eye examination, and good blood sugar, blood pressure and cholesterol control. Controled when last checked, updated lab needed  Diabetic Labs Latest Ref Rng & Units 03/31/2018 03/30/2018 03/29/2018 02/26/2018 12/04/2017  HbA1c <5.7 % of total Hgb - - - - 6.8(H)  Microalbumin Not Estab. ug/mL - - - - -  Micro/Creat Ratio 0.0 - 30.0 mg/g creat - - - - -  Chol <200 mg/dL - - - - -  HDL >40 mg/dL - - - - -  Calc LDL <100 mg/dL - - - - -  Triglycerides <150 mg/dL - - - - -  Creatinine 0.61 - 1.24 mg/dL 1.52(H) 1.63(H) 1.62(H) 1.32(H) 1.41(H)   BP/Weight 04/16/2018 03/31/2018 03/29/2018 02/27/2018 01/28/2018 01/11/2018 52/84/1324  Systolic BP 401 027 - 253 664 403 474  Diastolic BP 84 65 - 95 74 90 78  Wt. (Lbs) 244.12 - 232.8 - 238 - 240  BMI 32.21 - 30.71 - 31.4 - 31.66  Some encounter information is confidential and restricted. Go to Review Flowsheets activity to see all data.   Foot/eye exam completion dates Latest Ref Rng & Units 07/10/2016 08/30/2015  Eye Exam No Retinopathy - -  Foot Form Completion - Done Done

## 2018-04-21 NOTE — Progress Notes (Signed)
Matthew Molina     MRN: [id number]      DOB: 1951/06/20   HPI Matthew Molina is here for follow up and re-evaluation of chronic medical conditions, medication management and review of any available recent lab and radiology data.  Preventive health is updated, specifically  Cancer screening and Immunization.   Questions or concerns regarding consultations or procedures which the PT has had in the interim are  addressed. The PT denies any adverse reactions to current medications since the last visit.  ROS Denies sinus pressure, nasal congestion, ear pain or sore throat. 5 day h/o cough , wheeze and shortness of breath Denies chest pains, palpitations and leg swelling Denies abdominal pain, nausea, vomiting,diarrhea or constipation.   Denies dysuria, frequency, hesitancy or incontinence. Denies uncontrolled  joint pain and  swelling  He does c/o  limitation in mobility. Denies headaches, seizures, numbness, or tingling. Denies uncontrolled depression, anxiety or insomnia. Denies skin break down or rash. Denies polyuria, polydipsia, blurred vision , or hypoglycemic episodes.   PE  BP 130/84 (BP Location: Left Arm, Patient Position: Sitting, Cuff Size: Large)   Pulse 96   Ht 6\' 1"  (1.854 m)   Wt 244 lb 1.9 oz (110.7 kg)   SpO2 97%   BMI 32.21 kg/m   Patient alert and oriented and in no cardiopulmonary distress.  HEENT: No facial asymmetry, EOMI,   oropharynx pink and moist.  Neck supple no JVD, no mass.  Chest: decreased air entry with wheezing.  CVS: S1, S2 no murmurs, no S3.Regular rate.  ABD: Soft non tender.   Ext: No edema  MS: Adequate ROM spine, shoulders, hips and knees.  Skin: Intact, no ulcerations or rash noted.  Psych: Good eye contact, flat affect not  anxious or depressed appearing.  CNS: CN 2-12 intact, power,  normal throughout.no focal deficits noted.   Assessment & Plan  COPD (chronic obstructive pulmonary disease) ([place]) Currently having a  f;lare Depo medrol 80 mg iM in office followed by a prednisone dose pack  COPD exacerbation ([place]) Depo Medrol 870 mg Im in the office followed by short course of oral prednisone  Diabetes mellitus type 2 in obese [facility]) Matthew Molina is reminded of the importance of commitment to daily physical activity for 30 minutes or more, as able and the need to limit carbohydrate intake to 30 to 60 grams per meal to help with blood sugar control.   The need to take medication as prescribed, test blood sugar as directed, and to call between visits if there is a concern that blood sugar is uncontrolled is also discussed.   Matthew Molina is reminded of the importance of daily foot exam, annual eye examination, and good blood sugar, blood pressure and cholesterol control. Controled when last checked, updated lab needed  Diabetic Labs Latest Ref Rng & Units 03/31/2018 03/30/2018 03/29/2018 02/26/2018 12/04/2017  HbA1c <5.7 % of total Hgb - - - - 6.8(H)  Microalbumin Not Estab. ug/mL - - - - -  Micro/Creat Ratio 0.0 - 30.0 mg/g creat - - - - -  Chol <200 mg/dL - - - - -  HDL >40 mg/dL - - - - -  Calc LDL <100 mg/dL - - - - -  Triglycerides <150 mg/dL - - - - -  Creatinine 0.61 - 1.24 mg/dL 1.52(H) 1.63(H) 1.62(H) 1.32(H) 1.41(H)   BP/Weight 04/16/2018 03/31/2018 03/29/2018 02/27/2018 01/28/2018 01/11/2018 24/40/1027  Systolic BP 253 664 - 403 474 259 563  Diastolic BP 84  65 - 95 74 90 78  Wt. (Lbs) 244.12 - 232.8 - 238 - 240  BMI 32.21 - 30.71 - 31.4 - 31.66  Some encounter information is confidential and restricted. Go to Review Flowsheets activity to see all data.   Foot/eye exam completion dates Latest Ref Rng & Units 07/10/2016 08/30/2015  Eye Exam No Retinopathy - -  Foot Form Completion - Done Done        Tobacco abuse Quit in May 2019, and using the nicotine patch, applauded on this and encouraged to use all the asistance he can to remain nicotine free  Obesity (BMI 30.0-34.9) Deteriorated. Patient  re-educated about  the importance of commitment to a  minimum of 150 minutes of exercise per week.  The importance of healthy food choices with portion control discussed. Encouraged to start a food diary, count calories and to consider  joining a support group. Sample diet sheets offered. Goals set by the patient for the next several months.   Weight /BMI 04/16/2018 03/29/2018 01/28/2018  WEIGHT 244 lb 1.9 oz 232 lb 12.8 oz 238 lb  HEIGHT 6\' 1"  6\' 1"  6\' 1"   BMI 32.21 kg/m2 30.71 kg/m2 31.4 kg/m2  Some encounter information is confidential and restricted. Go to Review Flowsheets activity to see all data.      Hyperlipidemia Hyperlipidemia:Low fat diet discussed and encouraged.   Lipid Panel  Lab Results  Component Value Date   CHOL 164 07/05/2017   HDL 36 (L) 07/05/2017   LDLCALC 91 07/05/2017   TRIG 183 (H) 07/05/2017   CHOLHDL 4.6 07/05/2017   Uncontrolled and update lab is past due    Hypertension Controlled, no change in medication DASH diet and commitment to daily physical activity for a minimum of 30 minutes discussed and encouraged, as a part of hypertension management. The importance of attaining a healthy weight is also discussed.  BP/Weight 04/16/2018 03/31/2018 03/29/2018 02/27/2018 01/28/2018 01/11/2018 36/46/8032  Systolic BP 122 482 - 500 370 488 891  Diastolic BP 84 65 - 95 74 90 78  Wt. (Lbs) 244.12 - 232.8 - 238 - 240  BMI 32.21 - 30.71 - 31.4 - 31.66  Some encounter information is confidential and restricted. Go to Review Flowsheets activity to see all data.

## 2018-04-21 NOTE — Assessment & Plan Note (Signed)
Controlled, no change in medication DASH diet and commitment to daily physical activity for a minimum of 30 minutes discussed and encouraged, as a part of hypertension management. The importance of attaining a healthy weight is also discussed.  BP/Weight 04/16/2018 03/31/2018 03/29/2018 02/27/2018 01/28/2018 01/11/2018 65/46/5035  Systolic BP 465 681 - 275 170 017 494  Diastolic BP 84 65 - 95 74 90 78  Wt. (Lbs) 244.12 - 232.8 - 238 - 240  BMI 32.21 - 30.71 - 31.4 - 31.66  Some encounter information is confidential and restricted. Go to Review Flowsheets activity to see all data.

## 2018-04-21 NOTE — Assessment & Plan Note (Signed)
Deteriorated. Patient re-educated about  the importance of commitment to a  minimum of 150 minutes of exercise per week.  The importance of healthy food choices with portion control discussed. Encouraged to start a food diary, count calories and to consider  joining a support group. Sample diet sheets offered. Goals set by the patient for the next several months.   Weight /BMI 04/16/2018 03/29/2018 01/28/2018  WEIGHT 244 lb 1.9 oz 232 lb 12.8 oz 238 lb  HEIGHT 6\' 1"  6\' 1"  6\' 1"   BMI 32.21 kg/m2 30.71 kg/m2 31.4 kg/m2  Some encounter information is confidential and restricted. Go to Review Flowsheets activity to see all data.

## 2018-04-21 NOTE — Assessment & Plan Note (Signed)
Quit in May 2019, and using the nicotine patch, applauded on this and encouraged to use all the asistance he can to remain nicotine free

## 2018-04-25 ENCOUNTER — Telehealth: Payer: Self-pay | Admitting: Family Medicine

## 2018-04-25 ENCOUNTER — Other Ambulatory Visit: Payer: Self-pay

## 2018-04-25 MED ORDER — ACCU-CHEK SOFTCLIX LANCETS MISC: 5 refills | Status: DC

## 2018-04-25 NOTE — Telephone Encounter (Signed)
Done

## 2018-04-25 NOTE — Telephone Encounter (Signed)
Patient request for lancets for accuchek Cb#: [phone] [facility] is his pharmacy.

## 2018-04-26 ENCOUNTER — Ambulatory Visit: Payer: Medicare Other | Admitting: Podiatry

## 2018-04-29 DIAGNOSIS — Z1211 Encounter for screening for malignant neoplasm of colon: Secondary | ICD-10-CM | POA: Diagnosis not present

## 2018-05-01 DIAGNOSIS — J449 Chronic obstructive pulmonary disease, unspecified: Secondary | ICD-10-CM | POA: Diagnosis not present

## 2018-05-02 ENCOUNTER — Other Ambulatory Visit: Payer: Self-pay | Admitting: Family Medicine

## 2018-05-02 DIAGNOSIS — E1169 Type 2 diabetes mellitus with other specified complication: Secondary | ICD-10-CM | POA: Diagnosis not present

## 2018-05-02 DIAGNOSIS — E785 Hyperlipidemia, unspecified: Secondary | ICD-10-CM | POA: Diagnosis not present

## 2018-05-03 ENCOUNTER — Ambulatory Visit (INDEPENDENT_AMBULATORY_CARE_PROVIDER_SITE_OTHER): Payer: Medicare Other | Admitting: Podiatry

## 2018-05-03 ENCOUNTER — Encounter: Payer: Self-pay | Admitting: Podiatry

## 2018-05-03 DIAGNOSIS — E1149 Type 2 diabetes mellitus with other diabetic neurological complication: Secondary | ICD-10-CM

## 2018-05-03 DIAGNOSIS — M2141 Flat foot [pes planus] (acquired), right foot: Secondary | ICD-10-CM

## 2018-05-03 DIAGNOSIS — E1140 Type 2 diabetes mellitus with diabetic neuropathy, unspecified: Secondary | ICD-10-CM

## 2018-05-03 DIAGNOSIS — B351 Tinea unguium: Secondary | ICD-10-CM | POA: Diagnosis not present

## 2018-05-03 DIAGNOSIS — M2142 Flat foot [pes planus] (acquired), left foot: Secondary | ICD-10-CM

## 2018-05-03 DIAGNOSIS — M79676 Pain in unspecified toe(s): Secondary | ICD-10-CM | POA: Diagnosis not present

## 2018-05-03 DIAGNOSIS — Q828 Other specified congenital malformations of skin: Secondary | ICD-10-CM | POA: Diagnosis not present

## 2018-05-03 LAB — COMPLETE METABOLIC PANEL WITH GFR
AG Ratio: 1.4 (calc) (ref 1.0–2.5)
ALT: 47 U/L — ABNORMAL HIGH (ref 9–46)
AST: 33 U/L (ref 10–35)
Albumin: 4 g/dL (ref 3.6–5.1)
Alkaline phosphatase (APISO): 63 U/L (ref 40–115)
BUN/Creatinine Ratio: 5 (calc) — ABNORMAL LOW (ref 6–22)
BUN: 8 mg/dL (ref 7–25)
CO2: 28 mmol/L (ref 20–32)
Calcium: 9.7 mg/dL (ref 8.6–10.3)
Chloride: 104 mmol/L (ref 98–110)
Creat: 1.46 mg/dL — ABNORMAL HIGH (ref 0.70–1.25)
GFR, Est African American: 56 mL/min/{1.73_m2} — ABNORMAL LOW (ref >=60)
GFR, Est Non African American: 49 mL/min/{1.73_m2} — ABNORMAL LOW (ref >=60)
Globulin: 2.9 g/dL (calc) (ref 1.9–3.7)
Glucose, Bld: 84 mg/dL (ref 65–99)
Potassium: 4 mmol/L (ref 3.5–5.3)
Sodium: 137 mmol/L (ref 135–146)
Total Bilirubin: 0.9 mg/dL (ref 0.2–1.2)
Total Protein: 6.9 g/dL (ref 6.1–8.1)

## 2018-05-03 LAB — LIPID PANEL
Cholesterol: 181 mg/dL (ref <200)
HDL: 50 mg/dL (ref >40)
LDL Cholesterol (Calc): 108 mg/dL (calc) — ABNORMAL HIGH
Non-HDL Cholesterol (Calc): 131 mg/dL (calc) — ABNORMAL HIGH (ref <130)
Total CHOL/HDL Ratio: 3.6 (calc) (ref <5.0)
Triglycerides: 118 mg/dL (ref <150)

## 2018-05-03 LAB — HEMOGLOBIN A1C
Hgb A1c MFr Bld: 7 % of total Hgb — ABNORMAL HIGH (ref <5.7)
Mean Plasma Glucose: 154 (calc)
eAG (mmol/L): 8.5 (calc)

## 2018-05-03 LAB — CBC
HCT: 37.9 % — ABNORMAL LOW (ref 38.5–50.0)
Hemoglobin: 12.8 g/dL — ABNORMAL LOW (ref 13.2–17.1)
MCH: 29 pg (ref 27.0–33.0)
MCHC: 33.8 g/dL (ref 32.0–36.0)
MCV: 85.7 fL (ref 80.0–100.0)
MPV: 10.2 fL (ref 7.5–12.5)
Platelets: 232 10*3/uL (ref 140–400)
RBC: 4.42 10*6/uL (ref 4.20–5.80)
RDW: 13.8 % (ref 11.0–15.0)
WBC: 7.2 10*3/uL (ref 3.8–10.8)

## 2018-05-03 NOTE — Progress Notes (Signed)
Patient ID: Matthew Molina, male   DOB: 1951/01/06, 67 y.o.   MRN: [id number] Complaint:  Visit Type: Patient returns to my office for  preventative foot care services. Complaint: Patient states" my nails have grown long and thick and become painful to walk and wear shoes" Patient has been diagnosed with DM with no foot complications. The patient presents for preventative foot care services. No changes to ROS He relates two painful callus under both feet.    Podiatric Exam: Vascular: dorsalis pedis and posterior tibial pulses are palpable bilateral. Capillary return is immediate. Temperature gradient is WNL. Skin turgor WNL  Sensorium: Normal Semmes Weinstein monofilament test. Normal tactile sensation bilaterally. Nail Exam: Pt has thick disfigured discolored nails with subungual debris noted bilateral entire nail hallux through fifth toenails Ulcer Exam: There is no evidence of ulcer or pre-ulcerative changes or infection. Orthopedic Exam: Muscle tone and strength are WNL. No limitations in general ROM. No crepitus or effusions noted. Foot type and digits show no abnormalities. Bony prominences are unremarkable.Mild brownish discoloration 1st MPJ right foot. Skin:  Porokeratosis sub 5th metabase both feet.. No infection or ulcers  Diagnosis:  Onychomycosis, , Pain in right toe, pain in left toes, Porokeratosis  Treatment & Plan Procedures and Treatment: Consent by patient was obtained for treatment procedures. The patient understood the discussion of treatment and procedures well. All questions were answered thoroughly reviewed. Debridement of mycotic and hypertrophic toenails, 1 through 5 bilateral and clearing of subungual debris. No ulceration, no infection noted. Debride porokeratosis. Return Visit-Office Procedure: Patient instructed to return to the office for a follow up visit 3 months for continued evaluation and treatment.    Gardiner Barefoot DPM

## 2018-05-06 ENCOUNTER — Telehealth: Payer: Self-pay | Admitting: Family Medicine

## 2018-05-06 NOTE — Telephone Encounter (Signed)
States he was taken off his triamterene and now his BP is very elevated and thinks he needs to go back on it

## 2018-05-06 NOTE — Telephone Encounter (Signed)
Patient said his BP is running 168/111 and is not sure he should have stopped taking his BP medication.  He said Dr Moshe Cipro took him off the medication and now his BP is back up.  Please advise.

## 2018-05-07 ENCOUNTER — Ambulatory Visit: Payer: Self-pay | Admitting: Family Medicine

## 2018-05-07 NOTE — Telephone Encounter (Signed)
Appointment given to come in today for blood pressure check, states he could not get a ride, I advised him to call for another appointment tomorrow

## 2018-05-10 LAB — COLOGUARD: Cologuard: NEGATIVE

## 2018-05-14 ENCOUNTER — Other Ambulatory Visit (HOSPITAL_COMMUNITY)
Admission: RE | Admit: 2018-05-14 | Discharge: 2018-05-14 | Disposition: A | Discharge status: Home / Self Care | Payer: Medicare Other | Source: Other Acute Inpatient Hospital | Attending: Family Medicine | Admitting: Family Medicine

## 2018-05-14 ENCOUNTER — Ambulatory Visit (INDEPENDENT_AMBULATORY_CARE_PROVIDER_SITE_OTHER): Payer: Medicare Other | Admitting: Family Medicine

## 2018-05-14 ENCOUNTER — Encounter: Payer: Self-pay | Admitting: Family Medicine

## 2018-05-14 VITALS — BP 104/70 | HR 104 | Resp 16 | Ht 73.0 in | Wt 243.0 lb

## 2018-05-14 DIAGNOSIS — E1169 Type 2 diabetes mellitus with other specified complication: Secondary | ICD-10-CM | POA: Diagnosis not present

## 2018-05-14 DIAGNOSIS — E669 Obesity, unspecified: Secondary | ICD-10-CM | POA: Diagnosis present

## 2018-05-14 DIAGNOSIS — Z720 Tobacco use: Secondary | ICD-10-CM

## 2018-05-14 DIAGNOSIS — I150 Renovascular hypertension: Secondary | ICD-10-CM | POA: Diagnosis not present

## 2018-05-14 DIAGNOSIS — F17210 Nicotine dependence, cigarettes, uncomplicated: Secondary | ICD-10-CM | POA: Diagnosis not present

## 2018-05-14 MED ORDER — TRIAMTERENE-HCTZ 37.5-25 MG PO TABS: ORAL_TABLET | ORAL | 5 refills | Status: DC

## 2018-05-14 NOTE — Assessment & Plan Note (Signed)
Controlled, no change in medication Matthew Molina is reminded of the importance of commitment to daily physical activity for 30 minutes or more, as able and the need to limit carbohydrate intake to 30 to 60 grams per meal to help with blood sugar control.   The need to take medication as prescribed, test blood sugar as directed, and to call between visits if there is a concern that blood sugar is uncontrolled is also discussed.   Matthew Molina is reminded of the importance of daily foot exam, annual eye examination, and good blood sugar, blood pressure and cholesterol control.  Diabetic Labs Latest Ref Rng & Units 05/02/2018 03/31/2018 03/30/2018 03/29/2018 02/26/2018  HbA1c <5.7 % of total Hgb 7.0(H) - - - -  Microalbumin Not Estab. ug/mL - - - - -  Micro/Creat Ratio 0.0 - 30.0 mg/g creat - - - - -  Chol <200 mg/dL 181 - - - -  HDL >40 mg/dL 50 - - - -  Calc LDL mg/dL (calc) 108(H) - - - -  Triglycerides <150 mg/dL 118 - - - -  Creatinine 0.70 - 1.25 mg/dL 1.46(H) 1.52(H) 1.63(H) 1.62(H) 1.32(H)   BP/Weight 05/14/2018 04/16/2018 03/31/2018 03/29/2018 02/27/2018 01/28/2018 0/48/8891  Systolic BP 694 503 888 - 280 034 917  Diastolic BP 70 84 65 - 95 74 90  Wt. (Lbs) 243 244.12 - 232.8 - 238 -  BMI 32.06 32.21 - 30.71 - 31.4 -  Some encounter information is confidential and restricted. Go to Review Flowsheets activity to see all data.   Foot/eye exam completion dates Latest Ref Rng & Units 07/10/2016 08/30/2015  Eye Exam No Retinopathy - -  Foot Form Completion - Done Done

## 2018-05-14 NOTE — Patient Instructions (Addendum)
Wellness with nurse in July as before  Need Shingrix , check pharmacy  Microalb from office today  MD follow up end September or early October, call if you need me sooner  Fasting lipid, cmp and EGFr, hBA1C and TSH  f 5 days before MD visit  Please reduce the triamterene to HALF tablet daily, your blood pressure is too low  You  Need an eye exam you are referred to  "my eye Doctor" on [address]  Need to STOP smoking , now back top 1PPD, call [place]  Try and get back to class at the hospital   Coping with Quitting Smoking Quitting smoking is a physical and mental challenge. You will face cravings, withdrawal symptoms, and temptation. Before quitting, work with your health care provider to make a plan that can help you cope. Preparation can help you quit and keep you from giving in. How can I cope with cravings? Cravings usually last for 5-10 minutes. If you get through it, the craving will pass. Consider taking the following actions to help you cope with cravings:  Keep your mouth busy: ? Chew sugar-free gum. ? Suck on hard candies or a straw. ? Brush your teeth.  Keep your hands and body busy: ? Immediately change to a different activity when you feel a craving. ? Squeeze or play with a ball. ? Do an activity or a hobby, like making bead jewelry, practicing needlepoint, or working with wood. ? Mix up your normal routine. ? Take a short exercise break. Go for a quick walk or run up and down stairs. ? Spend time in public places where smoking is not allowed.  Focus on doing something kind or helpful for someone else.  Call a friend or family member to talk during a craving.  Join a support group.  Call a quit line, such as 1-800-QUIT-NOW.  Talk with your health care provider about medicines that might help you cope with cravings and make quitting easier for you.  How can I deal with withdrawal symptoms? Your body may experience negative effects as it tries  to get used to not having nicotine in the system. These effects are called withdrawal symptoms. They may include:  Feeling hungrier than normal.  Trouble concentrating.  Irritability.  Trouble sleeping.  Feeling depressed.  Restlessness and agitation.  Craving a cigarette.  To manage withdrawal symptoms:  Avoid places, people, and activities that trigger your cravings.  Remember why you want to quit.  Get plenty of sleep.  Avoid coffee and other caffeinated drinks. These may worsen some of your symptoms.  How can I handle social situations? Social situations can be difficult when you are quitting smoking, especially in the first few weeks. To manage this, you can:  Avoid parties, bars, and other social situations where people might be smoking.  Avoid alcohol.  Leave right away if you have the urge to smoke.  Explain to your family and friends that you are quitting smoking. Ask for understanding and support.  Plan activities with friends or family where smoking is not an option.  What are some ways I can cope with stress? Wanting to smoke may cause stress, and stress can make you want to smoke. Find ways to manage your stress. Relaxation techniques can help. For example:  Breathe slowly and deeply, in through your nose and out through your mouth.  Listen to soothing, relaxing music.  Talk with a family member or friend about your stress.  Light  a candle.  Soak in a bath or take a shower.  Think about a peaceful place.  What are some ways I can prevent weight gain? Be aware that many people gain weight after they quit smoking. However, not everyone does. To keep from gaining weight, have a plan in place before you quit and stick to the plan after you quit. Your plan should include:  Having healthy snacks. When you have a craving, it may help to: ? Eat plain popcorn, crunchy carrots, celery, or other cut vegetables. ? Chew sugar-free gum.  Changing how you  eat: ? Eat small portion sizes at meals. ? Eat 4-6 small meals throughout the day instead of 1-2 large meals a day. ? Be mindful when you eat. Do not watch television or do other things that might distract you as you eat.  Exercising regularly: ? Make time to exercise each day. If you do not have time for a long workout, do short bouts of exercise for 5-10 minutes several times a day. ? Do some form of strengthening exercise, like weight lifting, and some form of aerobic exercise, like running or swimming.  Drinking plenty of water or other low-calorie or no-calorie drinks. Drink 6-8 glasses of water daily, or as much as instructed by your health care provider.  Summary  Quitting smoking is a physical and mental challenge. You will face cravings, withdrawal symptoms, and temptation to smoke again. Preparation can help you as you go through these challenges.  You can cope with cravings by keeping your mouth busy (such as by chewing gum), keeping your body and hands busy, and making calls to family, friends, or a helpline for people who want to quit smoking.  You can cope with withdrawal symptoms by avoiding places where people smoke, avoiding drinks with caffeine, and getting plenty of rest.  Ask your health care provider about the different ways to prevent weight gain, avoid stress, and handle social situations. This information is not intended to replace advice given to you by your health care provider. Make sure you discuss any questions you have with your health care provider. Document Released: 12/01/2016 Document Revised: 12/01/2016 Document Reviewed: 12/01/2016 Elsevier Interactive Patient Education  [employer].

## 2018-05-15 LAB — MICROALBUMIN / CREATININE URINE RATIO
Creatinine, Urine: 54.5 mg/dL
Microalb Creat Ratio: 31.4 mg/g creat — ABNORMAL HIGH (ref 0.0–30.0)
Microalb, Ur: 17.1 ug/mL — ABNORMAL HIGH

## 2018-05-17 ENCOUNTER — Ambulatory Visit (HOSPITAL_COMMUNITY)
Admission: RE | Admit: 2018-05-17 | Discharge: 2018-05-17 | Disposition: A | Discharge status: Home / Self Care | Payer: Medicare Other | Source: Ambulatory Visit | Attending: Family Medicine | Admitting: Family Medicine

## 2018-05-17 DIAGNOSIS — Z122 Encounter for screening for malignant neoplasm of respiratory organs: Secondary | ICD-10-CM | POA: Insufficient documentation

## 2018-05-17 DIAGNOSIS — I2510 Atherosclerotic heart disease of native coronary artery without angina pectoris: Secondary | ICD-10-CM | POA: Insufficient documentation

## 2018-05-17 DIAGNOSIS — J90 Pleural effusion, not elsewhere classified: Secondary | ICD-10-CM | POA: Insufficient documentation

## 2018-05-17 DIAGNOSIS — I700 Atherosclerosis of aorta: Secondary | ICD-10-CM | POA: Insufficient documentation

## 2018-05-17 DIAGNOSIS — K449 Diaphragmatic hernia without obstruction or gangrene: Secondary | ICD-10-CM | POA: Insufficient documentation

## 2018-05-17 DIAGNOSIS — J439 Emphysema, unspecified: Secondary | ICD-10-CM | POA: Insufficient documentation

## 2018-05-18 ENCOUNTER — Encounter: Payer: Self-pay | Admitting: Family Medicine

## 2018-05-18 NOTE — Assessment & Plan Note (Signed)
Asked: confirms currently smokes 1PPD Assess: not wanting to set quoit date ,but does wish to quit Advise: needs to reduce lung disease , has COPD, has CAD by imaging study and currently being surveyed for elevated PSA, also the financials burden of smoking Assist: given uit nOW #, also community resource Arrange : f/u in next 2 months, time spent 5 mins

## 2018-05-18 NOTE — Assessment & Plan Note (Signed)
Overcorrected medication adjustment made  DASH diet and commitment to daily physical activity for a minimum of 30 minutes discussed and encouraged, as a part of hypertension management. The importance of attaining a healthy weight is also discussed.  BP/Weight 05/14/2018 04/16/2018 03/31/2018 03/29/2018 02/27/2018 01/28/2018 1/84/0375  Systolic BP 436 067 703 - 403 524 818  Diastolic BP 70 84 65 - 95 74 90  Wt. (Lbs) 243 244.12 - 232.8 - 238 -  BMI 32.06 32.21 - 30.71 - 31.4 -  Some encounter information is confidential and restricted. Go to Review Flowsheets activity to see all data.

## 2018-05-18 NOTE — Progress Notes (Signed)
Matthew Molina     MRN: [id number]      DOB: 17-Nov-1951   HPI Matthew Molina is here for follow up and re-evaluation of chronic medical conditions, medication management and review of any available recent lab and radiology data.  He had called in c/o high blood pressure, started his own med adjustment , and now his BP s too low, which he has also heard from a visiting [facility] nurse  Preventive health is updated, specifically  Cancer screening and Immunization.   Questions or concerns regarding consultations or procedures which the PT has had in the interim are  addressed. The PT denies any adverse reactions to current medications since the last visit.  C/o intermittent spasm in abdomen and legs Denies polyuria, polydipsia, blurred vision , or hypoglycemic episodes.    ROS Denies recent fever or chills. Denies sinus pressure, nasal congestion, ear pain or sore throat. Denies chest congestion, productive cough or wheezing. Denies chest pains, palpitations and leg swelling Denies , nausea, vomiting,diarrhea or constipation.   Denies dysuria, frequency, hesitancy or incontinence. C/o joint pain, and limitation in mobility. Denies headaches, seizures, numbness, or tingling. Denies uncontrolled depression, anxiety or insomnia. Denies skin break down or rash.   PE  BP 104/70   Pulse (!) 104   Resp 16   Ht 6\' 1"  (1.854 m)   Wt 243 lb (110.2 kg)   SpO2 95%   BMI 32.06 kg/m   Patient alert and oriented and in no cardiopulmonary distress.  HEENT: No facial asymmetry, EOMI,   oropharynx pink and moist.  Neck supple no JVD, no mass.  Chest: Clear to auscultation bilaterally.  CVS: S1, S2 no murmurs, no S3.Regular rate.  ABD: Soft non tender.   Ext: No edema  MS: Adequate ROM spine, shoulders, hips and knees.  Skin: Intact, no ulcerations or rash noted.  Psych: Good eye contact, normal affect. Memory intact not anxious or depressed appearing.  CNS: CN 2-12 intact, power,  normal  throughout.no focal deficits noted.   Assessment & Plan  Diabetes mellitus type 2 in obese (HCC) Controlled, no change in medication Matthew Molina is reminded of the importance of commitment to daily physical activity for 30 minutes or more, as able and the need to limit carbohydrate intake to 30 to 60 grams per meal to help with blood sugar control.   The need to take medication as prescribed, test blood sugar as directed, and to call between visits if there is a concern that blood sugar is uncontrolled is also discussed.   Matthew Molina is reminded of the importance of daily foot exam, annual eye examination, and good blood sugar, blood pressure and cholesterol control.  Diabetic Labs Latest Ref Rng & Units 05/02/2018 03/31/2018 03/30/2018 03/29/2018 02/26/2018  HbA1c <5.7 % of total Hgb 7.0(H) - - - -  Microalbumin Not Estab. ug/mL - - - - -  Micro/Creat Ratio 0.0 - 30.0 mg/g creat - - - - -  Chol <200 mg/dL 181 - - - -  HDL >40 mg/dL 50 - - - -  Calc LDL mg/dL (calc) 108(H) - - - -  Triglycerides <150 mg/dL 118 - - - -  Creatinine 0.70 - 1.25 mg/dL 1.46(H) 1.52(H) 1.63(H) 1.62(H) 1.32(H)   BP/Weight 05/14/2018 04/16/2018 03/31/2018 03/29/2018 02/27/2018 01/28/2018 03/19/271  Systolic BP 536 644 034 - 742 595 638  Diastolic BP 70 84 65 - 95 74 90  Wt. (Lbs) 243 244.12 - 232.8 - 238 -  BMI  32.06 32.21 - 30.71 - 31.4 -  Some encounter information is confidential and restricted. Go to Review Flowsheets activity to see all data.   Foot/eye exam completion dates Latest Ref Rng & Units 07/10/2016 08/30/2015  Eye Exam No Retinopathy - -  Foot Form Completion - Done Done        Hypertension Overcorrected medication adjustment made  DASH diet and commitment to daily physical activity for a minimum of 30 minutes discussed and encouraged, as a part of hypertension management. The importance of attaining a healthy weight is also discussed.  BP/Weight 05/14/2018 04/16/2018 03/31/2018 03/29/2018 02/27/2018  01/28/2018 0/62/6948  Systolic BP 546 270 350 - 093 818 299  Diastolic BP 70 84 65 - 95 74 90  Wt. (Lbs) 243 244.12 - 232.8 - 238 -  BMI 32.06 32.21 - 30.71 - 31.4 -  Some encounter information is confidential and restricted. Go to Review Flowsheets activity to see all data.       Tobacco abuse Asked: confirms currently smokes 1PPD Assess: not wanting to set quoit date ,but does wish to quit Advise: needs to reduce lung disease , has COPD, has CAD by imaging study and currently being surveyed for elevated PSA, also the financials burden of smoking Assist: given uit nOW #, also community resource Arrange : f/u in next 2 months, time spent 5 mins  Obesity (BMI 30.0-34.9) Unchanged. Patient re-educated about  the importance of commitment to a  minimum of 150 minutes of exercise per week.  The importance of healthy food choices with portion control discussed. Encouraged to start a food diary, count calories and to consider  joining a support group. Sample diet sheets offered. Goals set by the patient for the next several months.   Weight /BMI 05/14/2018 04/16/2018 03/29/2018  WEIGHT 243 lb 244 lb 1.9 oz 232 lb 12.8 oz  HEIGHT 6\' 1"  6\' 1"  6\' 1"   BMI 32.06 kg/m2 32.21 kg/m2 30.71 kg/m2  Some encounter information is confidential and restricted. Go to Review Flowsheets activity to see all data.

## 2018-05-18 NOTE — Assessment & Plan Note (Signed)
Unchanged. Patient re-educated about  the importance of commitment to a  minimum of 150 minutes of exercise per week.  The importance of healthy food choices with portion control discussed. Encouraged to start a food diary, count calories and to consider  joining a support group. Sample diet sheets offered. Goals set by the patient for the next several months.   Weight /BMI 05/14/2018 04/16/2018 03/29/2018  WEIGHT 243 lb 244 lb 1.9 oz 232 lb 12.8 oz  HEIGHT 6\' 1"  6\' 1"  6\' 1"   BMI 32.06 kg/m2 32.21 kg/m2 30.71 kg/m2  Some encounter information is confidential and restricted. Go to Review Flowsheets activity to see all data.

## 2018-05-20 ENCOUNTER — Telehealth: Payer: Self-pay | Admitting: Family Medicine

## 2018-05-20 ENCOUNTER — Other Ambulatory Visit: Payer: Self-pay | Admitting: Family Medicine

## 2018-05-20 NOTE — Telephone Encounter (Signed)
Pt called Please send something in for muscle cramps

## 2018-05-21 NOTE — Telephone Encounter (Signed)
Wants to know if you will call him in something for muscle cramps in his legs and abdomen and sometimes his fingers.Started a few days ago

## 2018-05-21 NOTE — Telephone Encounter (Signed)
Patient called to check the status of medication request from yesterday for muscle cramps

## 2018-05-22 ENCOUNTER — Other Ambulatory Visit: Payer: Self-pay | Admitting: Family Medicine

## 2018-05-22 MED ORDER — TIZANIDINE HCL 2 MG PO TABS: ORAL_TABLET | ORAL | 0 refills | Status: DC

## 2018-05-22 NOTE — Telephone Encounter (Signed)
Medication sent to [facility] , he is aware

## 2018-05-22 NOTE — Progress Notes (Signed)
zanaflex

## 2018-05-29 ENCOUNTER — Other Ambulatory Visit: Payer: Self-pay | Admitting: Family Medicine

## 2018-06-01 DIAGNOSIS — J449 Chronic obstructive pulmonary disease, unspecified: Secondary | ICD-10-CM | POA: Diagnosis not present

## 2018-06-05 ENCOUNTER — Other Ambulatory Visit: Payer: Self-pay | Admitting: Family Medicine

## 2018-06-12 ENCOUNTER — Other Ambulatory Visit: Payer: Self-pay | Admitting: Family Medicine

## 2018-06-26 ENCOUNTER — Other Ambulatory Visit: Payer: Self-pay | Admitting: Family Medicine

## 2018-07-01 DIAGNOSIS — J449 Chronic obstructive pulmonary disease, unspecified: Secondary | ICD-10-CM | POA: Diagnosis not present

## 2018-07-02 ENCOUNTER — Other Ambulatory Visit: Payer: Self-pay | Admitting: Family Medicine

## 2018-07-04 ENCOUNTER — Other Ambulatory Visit: Payer: Self-pay | Admitting: Family Medicine

## 2018-07-08 ENCOUNTER — Ambulatory Visit: Payer: Self-pay

## 2018-07-08 ENCOUNTER — Ambulatory Visit (INDEPENDENT_AMBULATORY_CARE_PROVIDER_SITE_OTHER): Payer: Medicare Other | Admitting: *Deleted

## 2018-07-08 VITALS — BP 142/86 | HR 103 | Temp 98.8°F | Resp 20 | Ht 73.0 in | Wt 246.0 lb

## 2018-07-08 DIAGNOSIS — Z0000 Encounter for general adult medical examination without abnormal findings: Secondary | ICD-10-CM | POA: Diagnosis not present

## 2018-07-08 NOTE — Patient Instructions (Signed)
  Matthew Molina , Thank you for taking time to come for your Medicare Wellness Visit. I appreciate your ongoing commitment to your health goals. Please review the following plan we discussed and let me know if I can assist you in the future.   These are the goals we discussed: Goals    . Exercise 3x per week (30 min per time)     Recommend starting a routine exercise program at least 3 days a week for 30-45 minutes at a time as tolerated.         This is a list of the screening recommended for you and due dates:  Health Maintenance  Topic Date Due  . Eye exam for diabetics  05/11/2018  . Flu Shot  07/18/2018  . Complete foot exam   08/01/2018  . Hemoglobin A1C  11/02/2018  . Colon Cancer Screening  04/02/2023  . Tetanus Vaccine  02/17/2027  .  Hepatitis C: One time screening is recommended by Center for Disease Control  (CDC) for  adults born from [birth year] through 1966.   Completed  . Pneumonia vaccines  Completed

## 2018-07-08 NOTE — Progress Notes (Signed)
Subjective:   Matthew Molina is a 67 y.o. male who presents for Medicare Annual/Subsequent preventive examination.  Review of Systems:   Cardiac Risk Factors include: advanced age (>76mn, >>51women);diabetes mellitus;hypertension;male gender;dyslipidemia;obesity (BMI >30kg/m2);sedentary lifestyle;smoking/ tobacco exposure     Objective:    Vitals: BP (!) 142/86   Pulse (!) 103   Temp 98.8 F (37.1 C)   Resp 20   Ht _0  (1.854 m)   Wt 246 lb 0.6 oz (111.6 kg)   SpO2 96%   BMI 32.46 kg/m   Body mass index is 32.46 kg/m.  Advanced Directives 07/08/2018 03/29/2018 02/26/2018 07/03/2017 05/09/2017 04/13/2017 04/11/2017  Does Patient Have a Medical Advance Directive? _1  No No  Would patient like information on creating a medical advance directive? No - Patient declined No - Patient declined - - Yes (MAU/Ambulatory/Procedural Areas - Information given) Yes (Inpatient - patient defers creating a medical advance directive at this time) Yes (Inpatient - patient requests chaplain consult to create a medical advance directive)  Pre-existing out of facility DNR order (yellow form or pink MOST form) - - - - - - -    Tobacco Social History   Tobacco Use  Smoking Status Current Every Day Smoker  . Packs/day: 1.00  . Years: 48.00  . Pack years: 48.00  . Types: Cigarettes  . Last attempt to quit: 05/02/2017  . Years since quitting: 1.1  Smokeless Tobacco Never Used  Tobacco Comment   quit a week ago     Ready to quit: No Counseling given: No Comment: quit a week ago   Clinical Intake:  Pre-visit preparation completed: Yes  Pain : No/denies pain Pain Score: 0-No pain     Diabetes: Yes CBG done?: No Did pt. bring in CBG monitor from home?: No  How often do you need to have someone help you when you read instructions, pamphlets, or other written materials from your doctor or pharmacy?: 1 - Never What is the last grade level you completed in school?:  12  Interpreter Needed?: No     Past Medical History:  Diagnosis Date  . [facility] kidney [place] ([facility]   . [place] [facility] [place] with [place] ([facility]   . Arthritis   . [facility] [place] disease) ([facility]   . Depression   . Diabetes mellitus   . Diabetes mellitus without complication ([facility]   . Hypercholesterolemia   . Hyperlipidemia   . Hypertension   . Multiple lung nodules on CT 04/02/2015  . Nicotine addiction   . Obesity   . Oxygen deficiency    qhs  . Schizophrenia ([facility]    Past Surgical History:  Procedure Laterality Date  . CATARACT EXTRACTION W/PHACO Left 11/20/2013   Procedure: CATARACT EXTRACTION PHACO AND INTRAOCULAR LENS PLACEMENT (IOC);  Surgeon: KTonny Branch MD;  Location: [facility];  Service: Ophthalmology;  Laterality: Left;  CDE:10.26  . CATARACT EXTRACTION W/PHACO Right 12/08/2013   Procedure: RIGHT EYE CATARACT EXTRACTION PHACO AND INTRAOCULAR LENS PLACEMENT ;  Surgeon: KTonny Branch MD;  Location: [facility];  Service: Ophthalmology;  Laterality: Right;  CDE 12.38  . COLONOSCOPY N/A 04/01/2013   Procedure: COLONOSCOPY;  Surgeon: SDanie Binder MD;  Location: [facility];  Service: Endoscopy;  Laterality: N/A;  10:00 AM-moved to [place]  . COLONOSCOPY  2014   INCOMPLETE PREP IN R COLON  . ESOPHAGOGASTRODUODENOSCOPY N/A 02/18/2016   Procedure: ESOPHAGOGASTRODUODENOSCOPY (EGD);  Surgeon: SDanie Binder MD;  Location: [facility];  Service: Endoscopy;  Laterality: N/A;  1145  . EYE SURGERY Left 11/2013   cataract extraction  . SAVORY DILATION N/A 02/18/2016   Procedure: SAVORY DILATION;  Surgeon: Danie Binder, MD;  Location: [facility];  Service: Endoscopy;  Laterality: N/A;  . SHOULDER SURGERY     Family History  Problem Relation Age of Onset  . Diabetes Mother   . Hypertension Mother   . Stroke Mother   . Diabetes Sister   . Heart disease Sister   . Kidney disease Father   . Drug abuse Brother   . Colon cancer Neg Hx   .  Colon polyps Neg Hx    Social History   Socioeconomic History  . Marital status: Divorced    Spouse name: Not on file  . Number of children: 2  . Years of education: Not on file  . Highest education level: Not on file  Occupational History  . Occupation: retired from [employer]  . Financial resource strain: Not hard at all  . Food insecurity:    Worry: Never true    Inability: Never true  . Transportation needs:    Medical: No    Non-medical: No  Tobacco Use  . Smoking status: Current Every Day Smoker    Packs/day: 1.00    Years: 48.00    Pack years: 48.00    Types: Cigarettes    Last attempt to quit: 05/02/2017    Years since quitting: 1.1  . Smokeless tobacco: Never Used  . Tobacco comment: quit a week ago  Substance and Sexual Activity  . Alcohol use: No    Alcohol/week: 0.0 oz  . Drug use: No  . Sexual activity: Not Currently    Birth control/protection: None  Lifestyle  . Physical activity:    Days per week: 0 days    Minutes per session: 0 min  . Stress: Not at all  Relationships  . Social connections:    Talks on phone: Twice a week    Gets together: Never    Attends religious service: More than 4 times per year    Active member of club or organization: No    Attends meetings of clubs or organizations: Never    Relationship status: Divorced  Other Topics Concern  . Not on file  Social History Narrative   ** Merged History Encounter **       ** Merged History Encounter **        Outpatient Encounter Medications as of 07/08/2018  Medication Sig  . ACCU-CHEK AVIVA PLUS test strip USE TO TEST ONCE DAILY  . ACCU-CHEK SOFTCLIX LANCETS lancets TEST BLOOD SUGAR ONCE DAILY AS DIRECTED.  Marland Kitchen albuterol (PROVENTIL HFA;VENTOLIN HFA) 108 (90 Base) MCG/ACT inhaler Inhale 2 puffs into the lungs every 6 (six) hours as needed for wheezing or shortness of breath.  Marland Kitchen aspirin EC 81 MG tablet Take 81 mg by mouth daily.  . blood glucose meter kit and supplies  Dispense based on patient and insurance preference.Requesting accuchek meter. Once daily testing dx e11.9  . budesonide-formoterol (SYMBICORT) 160-4.5 MCG/ACT inhaler Inhale 2 puffs into the lungs 2 (two) times daily.  . busPIRone (BUSPAR) 7.5 MG tablet TAKE 1 TABLET BY MOUTH 3 TIMES DAILY.  Marland Kitchen glipiZIDE (GLIPIZIDE XL) 5 MG 24 hr tablet Take 1 tablet (5 mg total) by mouth daily with breakfast.  . GLIPIZIDE XL 5 MG 24 hr tablet TAKE 1 TABLET DAILY WITH BREAKFAST.  Marland Kitchen  guaiFENesin (MUCINEX) 600 MG 12 hr tablet Take 1 tablet (600 mg total) by mouth 2 (two) times daily.  Marland Kitchen ipratropium-albuterol (DUONEB) 0.5-2.5 (3) MG/3ML SOLN INHALE 1 VIAL VIA NEBULIZER EVERY FOUR HOURS AS NEEDED.  Marland Kitchen olmesartan (BENICAR) 40 MG tablet TAKE 1 TABLET BY MOUTH ONCE A DAY.  . pravastatin (PRAVACHOL) 20 MG tablet Take 1 tablet (20 mg total) by mouth daily at 6 PM.  . risperiDONE (RISPERDAL) 1 MG tablet TAKE (1) TABLET BY MOUTH AT BEDTIME WITH 4MG.  . risperidone (RISPERDAL) 4 MG tablet TAKE (1) TABLET BY MOUTH AT BEDTIME.  Marland Kitchen tiotropium (SPIRIVA) 18 MCG inhalation capsule Place 1 capsule (18 mcg total) into inhaler and inhale daily.  Marland Kitchen tiZANidine (ZANAFLEX) 2 MG tablet One tablet once daily as needed, for muscle spasm  . tiZANidine (ZANAFLEX) 2 MG tablet TAKE 1 TABLET ONCE DAILY AS NEEDED FOR MUSCLE SPASMS.  Marland Kitchen tiZANidine (ZANAFLEX) 2 MG tablet TAKE 1 TABLET ONCE DAILY AS NEEDED FOR MUSCLE SPASMS.  Marland Kitchen tiZANidine (ZANAFLEX) 2 MG tablet TAKE 1 TABLET ONCE DAILY AS NEEDED FOR MUSCLE SPASMS.  Marland Kitchen tiZANidine (ZANAFLEX) 2 MG tablet TAKE 1 TABLET ONCE DAILY AS NEEDED FOR MUSCLE SPASMS.  Marland Kitchen tiZANidine (ZANAFLEX) 2 MG tablet TAKE 1 TABLET ONCE DAILY AS NEEDED FOR MUSCLE SPASMS.  Marland Kitchen traZODone (DESYREL) 50 MG tablet TAKE ONE TABLET BY MOUTH AT BEDTIME AS NEEDED FOR SLEEP.  Marland Kitchen triamterene-hydrochlorothiazide (MAXZIDE-25) 37.5-25 MG tablet Half tablet daily  . [DISCONTINUED] sildenafil (VIAGRA) 100 MG tablet Take 100 mg by mouth. Take one tablet  30 mins before intercourse    No facility-administered encounter medications on file as of 07/08/2018.     Activities of Daily Living In your present state of health, do you have any difficulty performing the following activities: 07/08/2018 03/29/2018  Hearing? Y N  Comment slightly -  Vision? N N  Difficulty concentrating or making decisions? N N  Walking or climbing stairs? N N  Dressing or bathing? N N  Doing errands, shopping? N N  Preparing Food and eating ? Y -  Using the Toilet? N -  In the past six months, have you accidently leaked urine? N -  Do you have problems with loss of bowel control? N -  Managing your Medications? N -  Managing your Finances? N -  Housekeeping or managing your Housekeeping? N -  Some recent data might be hidden    Patient Care Team: Fayrene Helper, MD as PCP - General (Family Medicine) Fay Records, MD as Consulting Physician (Cardiology) Fayrene Helper, MD Fayrene Helper, MD (Family Medicine) Danie Binder, MD as Consulting Physician (Gastroenterology) Gardiner Barefoot, DPM as Consulting Physician (Podiatry) Madelin Headings, DO (Optometry)   Assessment:   This is a routine wellness examination for Hendrick.  Exercise Activities and Dietary recommendations Current Exercise Habits: The patient does not participate in regular exercise at present, Exercise limited by: orthopedic condition(s)  Goals    . Exercise 3x per week (30 min per time)     Recommend starting a routine exercise program at least 3 days a week for 30-45 minutes at a time as tolerated.         Fall Risk Fall Risk  07/08/2018 04/16/2018 08/01/2017 05/09/2017 04/17/2017  Falls in the past year? _0   Risk for fall due to : - - - Impaired balance/gait -  Risk for fall due to: Comment - - - - -   Is the patient's home  free of loose throw rugs in walkways, pet beds, electrical cords, etc?   no      Grab bars in the bathroom? yes      Handrails on the  stairs?   yes      Adequate lighting?   yes  Timed Get Up and Go Performed:   Depression Screen PHQ 2/9 Scores 07/08/2018 04/16/2018 08/01/2017 05/09/2017  PHQ - 2 Score 0 0 0 0  PHQ- 9 Score - - - -  Exception Documentation - - - -    Cognitive Function     6CIT Screen 07/08/2018 05/09/2017  What Year? 0 points 0 points  What month? 0 points 0 points  What time? 0 points 0 points  Count back from 20 0 points 0 points  Months in reverse 0 points 0 points  Repeat phrase 2 points 0 points  Total Score 2 0    Immunization History  Administered Date(s) Administered  . Influenza Split 09/02/2012  . Influenza Whole 09/19/2007, 09/23/2009, 10/07/2010, 08/28/2011, 09/02/2015  . Influenza,inj,Quad PF,6+ Mos 09/02/2013, 11/30/2014, 08/30/2015, 09/27/2016, 12/04/2017  . Pneumococcal Conjugate-13 06/16/2014  . Pneumococcal Polysaccharide-23 06/28/2004, 08/27/2009, 04/17/2011, 05/04/2016  . Td 06/28/2004    Qualifies for Shingles Vaccine? Verify with insurance Screening Tests Health Maintenance  Topic Date Due  . OPHTHALMOLOGY EXAM  05/11/2018  . INFLUENZA VACCINE  07/18/2018  . FOOT EXAM  08/01/2018  . HEMOGLOBIN A1C  11/02/2018  . COLONOSCOPY  04/02/2023  . TETANUS/TDAP  02/17/2027  . Hepatitis C Screening  Completed  . PNA vac Low Risk Adult  Completed   Cancer Screenings: Lung: Low Dose CT Chest recommended if Age 29-80 years, 30 pack-year currently smoking OR have quit w/in 15years. Patient does qualify. Colorectal: up to date  Additional Screenings: Hepatitis C Screening:      Plan:    I have personally reviewed and noted the following in the patient's chart:   . Medical and social history . Use of alcohol, tobacco or illicit drugs  . Current medications and supplements . Functional ability and status . Nutritional status . Physical activity . Advanced directives . List of other physicians . Hospitalizations, surgeries, and ER visits in previous 12  months . Vitals . Screenings to include cognitive, depression, and falls . Referrals and appointments  In addition, I have reviewed and discussed with patient certain preventive protocols, quality metrics, and best practice recommendations. A written personalized care plan for preventive services as well as general preventive health recommendations were provided to patient.     Willette Pa, LPN  [id number]

## 2018-07-10 ENCOUNTER — Other Ambulatory Visit: Payer: Self-pay | Admitting: Family Medicine

## 2018-07-12 ENCOUNTER — Ambulatory Visit: Payer: Self-pay | Admitting: Urology

## 2018-07-17 ENCOUNTER — Ambulatory Visit: Payer: Self-pay

## 2018-07-18 ENCOUNTER — Other Ambulatory Visit: Payer: Self-pay | Admitting: Family Medicine

## 2018-07-19 ENCOUNTER — Telehealth: Payer: Self-pay | Admitting: Family Medicine

## 2018-07-19 NOTE — Telephone Encounter (Signed)
If he calls back, can schedule for Monday. We have openings

## 2018-07-19 NOTE — Telephone Encounter (Signed)
Patient is requesting prednisone rx for bad cough [facility] is the pharmacy.

## 2018-07-19 NOTE — Telephone Encounter (Signed)
Called pt back. Number out of service

## 2018-07-20 ENCOUNTER — Other Ambulatory Visit: Payer: Self-pay | Admitting: Family Medicine

## 2018-07-21 ENCOUNTER — Emergency Department (HOSPITAL_COMMUNITY)
Admission: EM | Admit: 2018-07-21 | Discharge: 2018-07-21 | Disposition: A | Discharge status: Home / Self Care | Payer: Medicare Other | Attending: Emergency Medicine | Admitting: Emergency Medicine

## 2018-07-21 ENCOUNTER — Encounter (HOSPITAL_COMMUNITY): Payer: Self-pay | Admitting: Emergency Medicine

## 2018-07-21 ENCOUNTER — Emergency Department (HOSPITAL_COMMUNITY): Payer: Medicare Other

## 2018-07-21 ENCOUNTER — Other Ambulatory Visit: Payer: Self-pay

## 2018-07-21 DIAGNOSIS — Z7984 Long term (current) use of oral hypoglycemic drugs: Secondary | ICD-10-CM | POA: Insufficient documentation

## 2018-07-21 DIAGNOSIS — R0600 Dyspnea, unspecified: Secondary | ICD-10-CM | POA: Diagnosis not present

## 2018-07-21 DIAGNOSIS — I129 Hypertensive chronic kidney disease with stage 1 through stage 4 chronic kidney disease, or unspecified chronic kidney disease: Secondary | ICD-10-CM | POA: Insufficient documentation

## 2018-07-21 DIAGNOSIS — J441 Chronic obstructive pulmonary disease with (acute) exacerbation: Secondary | ICD-10-CM | POA: Insufficient documentation

## 2018-07-21 DIAGNOSIS — R062 Wheezing: Secondary | ICD-10-CM | POA: Diagnosis not present

## 2018-07-21 DIAGNOSIS — R0602 Shortness of breath: Secondary | ICD-10-CM | POA: Diagnosis not present

## 2018-07-21 DIAGNOSIS — F17210 Nicotine dependence, cigarettes, uncomplicated: Secondary | ICD-10-CM | POA: Diagnosis not present

## 2018-07-21 DIAGNOSIS — Z7982 Long term (current) use of aspirin: Secondary | ICD-10-CM | POA: Insufficient documentation

## 2018-07-21 DIAGNOSIS — I2510 Atherosclerotic heart disease of native coronary artery without angina pectoris: Secondary | ICD-10-CM | POA: Insufficient documentation

## 2018-07-21 DIAGNOSIS — Z79899 Other long term (current) drug therapy: Secondary | ICD-10-CM | POA: Diagnosis not present

## 2018-07-21 DIAGNOSIS — N183 Chronic kidney disease, stage 3 (moderate): Secondary | ICD-10-CM | POA: Insufficient documentation

## 2018-07-21 DIAGNOSIS — R069 Unspecified abnormalities of breathing: Secondary | ICD-10-CM | POA: Diagnosis not present

## 2018-07-21 DIAGNOSIS — E119 Type 2 diabetes mellitus without complications: Secondary | ICD-10-CM | POA: Insufficient documentation

## 2018-07-21 DIAGNOSIS — R05 Cough: Secondary | ICD-10-CM | POA: Diagnosis not present

## 2018-07-21 LAB — CBC WITH DIFFERENTIAL/PLATELET
Basophils Absolute: 0 10*3/uL (ref 0.0–0.1)
Basophils Relative: 0 %
Eosinophils Absolute: 0.6 10*3/uL (ref 0.0–0.7)
Eosinophils Relative: 12 %
HCT: 37.3 % — ABNORMAL LOW (ref 39.0–52.0)
Hemoglobin: 12.3 g/dL — ABNORMAL LOW (ref 13.0–17.0)
Lymphocytes Relative: 31 %
Lymphs Abs: 1.7 10*3/uL (ref 0.7–4.0)
MCH: 29.3 pg (ref 26.0–34.0)
MCHC: 33 g/dL (ref 30.0–36.0)
MCV: 88.8 fL (ref 78.0–100.0)
Monocytes Absolute: 0.5 10*3/uL (ref 0.1–1.0)
Monocytes Relative: 9 %
Neutro Abs: 2.6 10*3/uL (ref 1.7–7.7)
Neutrophils Relative %: 48 %
Platelets: 209 10*3/uL (ref 150–400)
RBC: 4.2 MIL/uL — ABNORMAL LOW (ref 4.22–5.81)
RDW: 13.2 % (ref 11.5–15.5)
WBC: 5.3 10*3/uL (ref 4.0–10.5)

## 2018-07-21 LAB — BASIC METABOLIC PANEL
Anion gap: 9 (ref 5–15)
BUN: 6 mg/dL — ABNORMAL LOW (ref 8–23)
CO2: 25 mmol/L (ref 22–32)
Calcium: 9.4 mg/dL (ref 8.9–10.3)
Chloride: 93 mmol/L — ABNORMAL LOW (ref 98–111)
Creatinine, Ser: 1.31 mg/dL — ABNORMAL HIGH (ref 0.61–1.24)
GFR calc Af Amer: >60 mL/min (ref >60)
GFR calc non Af Amer: 54 mL/min — ABNORMAL LOW (ref >60)
Glucose, Bld: 100 mg/dL — ABNORMAL HIGH (ref 70–99)
Potassium: 3.2 mmol/L — ABNORMAL LOW (ref 3.5–5.1)
Sodium: 127 mmol/L — ABNORMAL LOW (ref 135–145)

## 2018-07-21 LAB — BRAIN NATRIURETIC PEPTIDE: B Natriuretic Peptide: 13 pg/mL (ref 0.0–100.0)

## 2018-07-21 LAB — TROPONIN I: Troponin I: <0.03 ng/mL (ref <0.03)

## 2018-07-21 MED ORDER — BENZONATATE 100 MG PO CAPS
200.0000 mg | ORAL_CAPSULE | Freq: Once | ORAL | Status: AC
  Administered 2018-07-21: 200 mg via ORAL
  Filled 2018-07-21: qty 2

## 2018-07-21 MED ORDER — ALBUTEROL (5 MG/ML) CONTINUOUS INHALATION SOLN
10.0000 mg/h | INHALATION_SOLUTION | RESPIRATORY_TRACT | Status: DC
  Administered 2018-07-21: 10 mg/h via RESPIRATORY_TRACT
  Filled 2018-07-21: qty 20

## 2018-07-21 MED ORDER — IPRATROPIUM BROMIDE 0.02 % IN SOLN: 0.5000 mg | Freq: Once | RESPIRATORY_TRACT | Status: DC

## 2018-07-21 MED ORDER — IPRATROPIUM-ALBUTEROL 0.5-2.5 (3) MG/3ML IN SOLN
3.0000 mL | Freq: Once | RESPIRATORY_TRACT | Status: AC
  Administered 2018-07-21: 3 mL via RESPIRATORY_TRACT
  Filled 2018-07-21: qty 3

## 2018-07-21 MED ORDER — IPRATROPIUM BROMIDE 0.02 % IN SOLN
RESPIRATORY_TRACT | Status: AC
  Administered 2018-07-21: 0.5 mg via RESPIRATORY_TRACT
  Filled 2018-07-21: qty 2.5

## 2018-07-21 MED ORDER — PREDNISONE 20 MG PO TABS: 60.0000 mg | ORAL_TABLET | Freq: Every day | ORAL | 0 refills | Status: DC

## 2018-07-21 MED ORDER — METHYLPREDNISOLONE SODIUM SUCC 125 MG IJ SOLR
125.0000 mg | Freq: Once | INTRAMUSCULAR | Status: AC
  Administered 2018-07-21: 125 mg via INTRAVENOUS
  Filled 2018-07-21: qty 2

## 2018-07-21 MED ORDER — IPRATROPIUM BROMIDE 0.02 % IN SOLN
0.5000 mg | Freq: Once | RESPIRATORY_TRACT | Status: AC
  Administered 2018-07-21 (×2): 0.5 mg via RESPIRATORY_TRACT
  Filled 2018-07-21: qty 2.5

## 2018-07-21 NOTE — ED Notes (Signed)
Pt has increased wheezing with persistent cough/bronchospasm.  MD aware and RT notified of protocol needs.

## 2018-07-21 NOTE — Discharge Instructions (Signed)
You were here in the emergency department for your difficulty breathing and coughing.  You had blood work EKG and a chest x-ray.  This is likely an exacerbation of your COPD and you were improved after steroids and breathing treatments.  He will need to continue to do your breathing treatments at home and we are prescribing you 4 more days of prednisone.  You will be important for you to follow-up with your doctor this week and to continue to stop smoking.  Please return if any worsening symptoms.

## 2018-07-21 NOTE — ED Triage Notes (Signed)
SOB for last 4 days.  History of COPD and smoker.  Given albuterol by EMS currently.  Wheezing to upper fields and diminished in bases.  Denies any pain at this time.

## 2018-07-21 NOTE — ED Provider Notes (Signed)
[facility] EMERGENCY DEPARTMENT Provider Note   CSN: [id number] Arrival date & time: 07/21/18  1534     History   Chief Complaint Chief Complaint  Patient presents with  . Shortness of Breath    times four days    HPI Matthew Molina is a 67 y.o. male.  He presents by EMS after complaining of 4 days of increased shortness of breath and cough productive of brown sputum.  He has a history of COPD and is a smoker.  He states he stopped smoking the last 4 days.  His last admission was in April for COPD exacerbation.  He denies any fevers chills chest pain nausea vomiting diarrhea.  He lives alone.  No recent falls.  EMS had given him a albuterol treatment in route he says he feels somewhat improved.  The history is provided by the patient.  Shortness of Breath  This is a recurrent problem. The average episode lasts 4 days. The problem occurs intermittently.The problem has been gradually worsening. Associated symptoms include cough, sputum production, wheezing and PND. Pertinent negatives include no fever, no headaches, no sore throat, no neck pain, no hemoptysis, no chest pain, no syncope, no vomiting, no abdominal pain, no rash, no leg pain and no leg swelling. It is unknown what precipitated the problem. Risk factors include smoking. He has tried inhaled steroids for the symptoms. The treatment provided no relief. He has had prior hospitalizations. He has had prior ED visits. Associated medical issues include COPD.    Past Medical History:  Diagnosis Date  . Acute kidney injury ([place])   . Acute respiratory failure with hypoxia ([place])   . Arthritis   . COPD (chronic obstructive pulmonary disease) ([place])   . Depression   . Diabetes mellitus   . Diabetes mellitus without complication ([place])   . Hypercholesterolemia   . Hyperlipidemia   . Hypertension   . Multiple lung nodules on CT 04/02/2015  . Nicotine addiction   . Obesity   . Oxygen deficiency    qhs  . Schizophrenia [facility])      Patient Active Problem List   Diagnosis Date Noted  . Unsteady gait 04/18/2017  . COPD exacerbation ([place]) 02/05/2017  . Elevated PSA 10/01/2016  . External hemorrhoid, thrombosed 08/24/2016  . GERD (gastroesophageal reflux disease) 08/24/2016  . CKD (chronic kidney disease) stage 3, GFR 30-59 ml/min (HCC) 05/22/2016  . Dysphagia 01/05/2016  . Insomnia 01/05/2016  . COPD (chronic obstructive pulmonary disease) ([place]) 11/05/2015  . Hypertension 11/05/2015  . Tobacco abuse 07/24/2015  . Coronary atherosclerosis of native coronary artery 04/02/2015  . Multiple lung nodules on CT 04/02/2015  . GAD (generalized anxiety disorder) 07/04/2012  . Back pain with radiation 11/01/2011  . Diabetes mellitus type 2 in obese ([place]) 04/17/2011  . Hyperlipidemia 06/02/2008  . Obesity (BMI 30.0-34.9) 06/02/2008  . Schizophrenia, unspecified type ([place]) 06/02/2008    Past Surgical History:  Procedure Laterality Date  . CATARACT EXTRACTION W/PHACO Left 11/20/2013   Procedure: CATARACT EXTRACTION PHACO AND INTRAOCULAR LENS PLACEMENT (IOC);  Surgeon: Tonny Branch, MD;  Location: [facility] ORS;  Service: Ophthalmology;  Laterality: Left;  CDE:10.26  . CATARACT EXTRACTION W/PHACO Right 12/08/2013   Procedure: RIGHT EYE CATARACT EXTRACTION PHACO AND INTRAOCULAR LENS PLACEMENT ;  Surgeon: Tonny Branch, MD;  Location: [facility] ORS;  Service: Ophthalmology;  Laterality: Right;  CDE 12.38  . COLONOSCOPY N/A 04/01/2013   Procedure: COLONOSCOPY;  Surgeon: Danie Binder, MD;  Location: [facility] ENDO SUITE;  Service: Endoscopy;  Laterality: N/A;  10:00 AM-moved to [place] notified pt  . COLONOSCOPY  2014   INCOMPLETE PREP IN R COLON  . ESOPHAGOGASTRODUODENOSCOPY N/A 02/18/2016   Procedure: ESOPHAGOGASTRODUODENOSCOPY (EGD);  Surgeon: Danie Binder, MD;  Location: [facility] ENDO SUITE;  Service: Endoscopy;  Laterality: N/A;  1145  . EYE SURGERY Left 11/2013   cataract extraction  . SAVORY DILATION N/A 02/18/2016   Procedure: SAVORY  DILATION;  Surgeon: Danie Binder, MD;  Location: [facility] ENDO SUITE;  Service: Endoscopy;  Laterality: N/A;  . SHOULDER SURGERY          Home Medications    Prior to Admission medications   Medication Sig Start Date End Date Taking? Authorizing Provider  ACCU-CHEK AVIVA PLUS test strip USE TO TEST ONCE DAILY 11/05/17   Fayrene Helper, MD  ACCU-CHEK SOFTCLIX LANCETS lancets TEST BLOOD SUGAR ONCE DAILY AS DIRECTED. 04/25/18   Fayrene Helper, MD  albuterol (PROVENTIL HFA;VENTOLIN HFA) 108 (90 Base) MCG/ACT inhaler Inhale 2 puffs into the lungs every 6 (six) hours as needed for wheezing or shortness of breath. 04/16/18   Fayrene Helper, MD  aspirin EC 81 MG tablet Take 81 mg by mouth daily.    [provider]  blood glucose meter kit and supplies Dispense based on patient and insurance preference.Requesting accuchek meter. Once daily testing dx e11.9 12/28/17   Fayrene Helper, MD  budesonide-formoterol [facility]) 160-4.5 MCG/ACT inhaler Inhale 2 puffs into the lungs 2 (two) times daily. 04/16/18   Fayrene Helper, MD  busPIRone (BUSPAR) 7.5 MG tablet TAKE 1 TABLET BY MOUTH 3 TIMES DAILY. 07/02/18   Fayrene Helper, MD  glipiZIDE (GLIPIZIDE XL) 5 MG 24 hr tablet Take 1 tablet (5 mg total) by mouth daily with breakfast. 12/04/17   Fayrene Helper, MD  GLIPIZIDE XL 5 MG 24 hr tablet TAKE 1 TABLET DAILY WITH BREAKFAST. 07/02/18   Fayrene Helper, MD  GLIPIZIDE XL 5 MG 24 hr tablet TAKE 1 TABLET DAILY WITH BREAKFAST. 07/19/18   Fayrene Helper, MD  guaiFENesin (MUCINEX) 600 MG 12 hr tablet Take 1 tablet (600 mg total) by mouth 2 (two) times daily. 03/31/18   Kathie Dike, MD  ipratropium-albuterol (DUONEB) 0.5-2.5 (3) MG/3ML SOLN INHALE 1 VIAL VIA NEBULIZER EVERY FOUR HOURS AS NEEDED. 03/02/16   Fayrene Helper, MD  olmesartan (BENICAR) 40 MG tablet TAKE 1 TABLET BY MOUTH ONCE A DAY. 07/11/18   Fayrene Helper, MD  pravastatin (PRAVACHOL) 20 MG tablet Take  1 tablet (20 mg total) by mouth daily at 6 PM. 03/31/18   Kathie Dike, MD  risperiDONE (RISPERDAL) 1 MG tablet TAKE (1) TABLET BY MOUTH AT BEDTIME WITH '4MG'$ . 07/19/18   Fayrene Helper, MD  risperidone (RISPERDAL) 4 MG tablet TAKE (1) TABLET BY MOUTH AT BEDTIME. 07/11/18   Fayrene Helper, MD  SYMBICORT 80-4.5 MCG/ACT inhaler INHALE 2 PUFFS INTO LUNGS TWICE DAILY. 07/11/18   Fayrene Helper, MD  tiotropium (SPIRIVA) 18 MCG inhalation capsule Place 1 capsule (18 mcg total) into inhaler and inhale daily. 04/17/17   Fayrene Helper, MD  tiZANidine (ZANAFLEX) 2 MG tablet One tablet once daily as needed, for muscle spasm 05/22/18   Fayrene Helper, MD  tiZANidine (ZANAFLEX) 2 MG tablet TAKE 1 TABLET ONCE DAILY AS NEEDED FOR MUSCLE SPASMS. 05/29/18   Fayrene Helper, MD  tiZANidine (ZANAFLEX) 2 MG tablet TAKE 1 TABLET ONCE DAILY AS NEEDED FOR MUSCLE SPASMS. 06/05/18  Fayrene Helper, MD  tiZANidine (ZANAFLEX) 2 MG tablet TAKE 1 TABLET ONCE DAILY AS NEEDED FOR MUSCLE SPASMS. 06/12/18   Fayrene Helper, MD  tiZANidine (ZANAFLEX) 2 MG tablet TAKE 1 TABLET ONCE DAILY AS NEEDED FOR MUSCLE SPASMS. 06/26/18   Fayrene Helper, MD  tiZANidine (ZANAFLEX) 2 MG tablet TAKE 1 TABLET ONCE DAILY AS NEEDED FOR MUSCLE SPASMS. 07/04/18   Fayrene Helper, MD  traZODone (DESYREL) 50 MG tablet TAKE ONE TABLET BY MOUTH AT BEDTIME AS NEEDED FOR SLEEP. 05/21/18   Fayrene Helper, MD  triamterene-hydrochlorothiazide [facility]) 37.5-25 MG tablet Half tablet daily 05/14/18   Fayrene Helper, MD  sildenafil (VIAGRA) 100 MG tablet Take 100 mg by mouth. Take one tablet 30 mins before intercourse   03/11/12  [provider]    Family History Family History  Problem Relation Age of Onset  . Diabetes Mother   . Hypertension Mother   . Stroke Mother   . Diabetes Sister   . Heart disease Sister   . Kidney disease Father   . Drug abuse Brother   . Colon cancer Neg Hx   . Colon polyps  Neg Hx     Social History Social History   Tobacco Use  . Smoking status: Current Every Day Smoker    Packs/day: 1.00    Years: 48.00    Pack years: 48.00    Types: Cigarettes    Last attempt to quit: 05/02/2017    Years since quitting: 1.2  . Smokeless tobacco: Never Used  . Tobacco comment: quit a week ago  Substance Use Topics  . Alcohol use: No    Alcohol/week: 0.0 oz  . Drug use: No     Allergies   Sertraline hcl and Wellbutrin [bupropion]   Review of Systems Review of Systems  Constitutional: Negative for fever.  HENT: Negative for sore throat.   Eyes: Positive for visual disturbance.  Respiratory: Positive for cough, sputum production, shortness of breath and wheezing. Negative for hemoptysis.   Cardiovascular: Positive for PND. Negative for chest pain, leg swelling and syncope.  Gastrointestinal: Negative for abdominal pain and vomiting.  Genitourinary: Negative for dysuria.  Musculoskeletal: Negative for neck pain.  Skin: Negative for rash.  Neurological: Negative for headaches.     Physical Exam Updated Vital Signs Ht '6\' 1"'$  (1.854 m)   Wt 108.9 kg (240 lb)   BMI 31.66 kg/m   Physical Exam  Constitutional: He appears well-developed and well-nourished.  HENT:  Head: Normocephalic and atraumatic.  Mouth/Throat: Oropharynx is clear and moist.  Eyes: Conjunctivae are normal.  Neck: Normal range of motion. Neck supple.  Cardiovascular: Normal rate and intact distal pulses.  Pulmonary/Chest: Accessory muscle usage present. No stridor. Tachypnea noted. He has wheezes (scattered).  Abdominal: Soft. He exhibits no mass. There is no tenderness.  Musculoskeletal: Normal range of motion.       Right lower leg: He exhibits no tenderness and no edema.       Left lower leg: He exhibits no tenderness and no edema.  Neurological: He is alert. GCS eye subscore is 4. GCS verbal subscore is 5. GCS motor subscore is 6.  Skin: Skin is warm and dry.  Psychiatric: He  has a normal mood and affect.  Nursing note and vitals reviewed.    ED Treatments / Results  Labs (all labs ordered are listed, but only abnormal results are displayed) Labs Reviewed  BASIC METABOLIC PANEL - Abnormal; Notable for the following components:  Result Value   Sodium 127 (*)    Potassium 3.2 (*)    Chloride 93 (*)    Glucose, Bld 100 (*)    BUN 6 (*)    Creatinine, Ser 1.31 (*)    GFR calc non Af Amer 54 (*)    All other components within normal limits  CBC WITH DIFFERENTIAL/PLATELET - Abnormal; Notable for the following components:   RBC 4.20 (*)    Hemoglobin 12.3 (*)    HCT 37.3 (*)    All other components within normal limits  TROPONIN I  BRAIN NATRIURETIC PEPTIDE    EKG EKG Interpretation  Date/Time:  Sunday July 21 2018 15:43:20 EDT Ventricular Rate:  101 PR Interval:    QRS Duration: 90 QT Interval:  356 QTC Calculation: 460 R Axis:   -55 Text Interpretation:  Sinus tachycardia Atrial premature complex Left anterior fascicular block Baseline wander in lead(s) V6 similar to prior 4/19 Confirmed by Aletta Edouard [phone]) on 07/21/2018 4:05:06 PM   Radiology Dg Chest 2 View  Result Date: 07/21/2018 CLINICAL DATA:  Shortness of breath and cough for 4 days. History of smoking and COPD. EXAM: CHEST - 2 VIEW COMPARISON:  Radiographs 03/29/2018.  CT 05/17/2018. FINDINGS: The heart size and mediastinal contours are stable. The lungs are clear. There are probable residual small bilateral pleural effusions on the lateral view. The bones appear unremarkable. IMPRESSION: Stable chest with probable small residual bilateral pleural effusions. No acute cardiopulmonary process. Electronically Signed   By: Richardean Sale M.D.   On: 07/21/2018 16:16    Procedures Procedures (including critical care time)  Medications Ordered in ED Medications  methylPREDNISolone sodium succinate (SOLU-MEDROL) 125 mg/2 mL injection 125 mg (125 mg Intravenous Given 07/21/18 1635)   ipratropium-albuterol (DUONEB) 0.5-2.5 (3) MG/3ML nebulizer solution 3 mL (3 mLs Nebulization Given 07/21/18 1642)  ipratropium (ATROVENT) nebulizer solution 0.5 mg (0.5 mg Nebulization Given 07/21/18 1808)  benzonatate (TESSALON) capsule 200 mg (200 mg Oral Given 07/21/18 1912)     Initial Impression / Assessment and Plan / ED Course  I have reviewed the triage vital signs and the nursing notes.  Pertinent labs & imaging results that were available during my care of the patient were reviewed by me and considered in my medical decision making (see chart for details).  Clinical Course as of Jul 23 1103  Nancy Fetter Jul 21, 4836  2030 67 year old male with history of COPD and active smoking here with 4 days of increased shortness of breath and cough productive of brown sputum.  He is received a breathing treatment by EMS and going to keep that up along with giving him some steroids.  He is getting some screening labs EKG and a chest x-ray.   [MB]  4098 Patient's initial work-up is been fairly unremarkable.  His creatinine is elevated but it is baseline.  His sodium is 127 which is lower than normal but has been low before.   [MB]  1739 Troponin and BMP are unremarkable and his chest x-ray does not show an obvious pneumonia.  This sounds like it is going to be a COPD exacerbation and if we can get him breathing better he possibly be discharged.   [MB]  1801 Went back and reevaluated patient.  Still having some scattered wheezing.  Respiratory therapy is here now and will get him on a continuous neb for an hour.  Patient is hoping to build to go home if we can get him feeling better.   [MB]  2048 Patient ambulating well here half of oxygen 95%.  He feels well enough to go home.   [MB]    Clinical Course User Index [MB] Hayden Rasmussen, MD     Final Clinical Impressions(s) / ED Diagnoses   Final diagnoses:  COPD exacerbation [facility])    ED Discharge Orders        Ordered    predniSONE (DELTASONE)  20 MG tablet  Daily     07/21/18 2049       Hayden Rasmussen, MD 07/22/18 1105

## 2018-07-21 NOTE — ED Notes (Signed)
Pt ambulated with steady gait, lowest o2 saturation reading 93%. MD aware.

## 2018-07-22 ENCOUNTER — Other Ambulatory Visit: Payer: Self-pay | Admitting: Family Medicine

## 2018-07-23 ENCOUNTER — Emergency Department (HOSPITAL_COMMUNITY): Payer: Medicare Other

## 2018-07-23 ENCOUNTER — Other Ambulatory Visit: Payer: Self-pay

## 2018-07-23 ENCOUNTER — Encounter (HOSPITAL_COMMUNITY): Payer: Self-pay | Admitting: Emergency Medicine

## 2018-07-23 ENCOUNTER — Observation Stay (HOSPITAL_COMMUNITY)
Admission: EM | Admit: 2018-07-23 | Discharge: 2018-07-24 | Disposition: A | Discharge status: Home / Self Care | Payer: Medicare Other | Attending: Internal Medicine | Admitting: Internal Medicine

## 2018-07-23 DIAGNOSIS — J441 Chronic obstructive pulmonary disease with (acute) exacerbation: Secondary | ICD-10-CM | POA: Diagnosis not present

## 2018-07-23 DIAGNOSIS — K219 Gastro-esophageal reflux disease without esophagitis: Secondary | ICD-10-CM | POA: Diagnosis present

## 2018-07-23 DIAGNOSIS — Z7982 Long term (current) use of aspirin: Secondary | ICD-10-CM | POA: Insufficient documentation

## 2018-07-23 DIAGNOSIS — J9601 Acute respiratory failure with hypoxia: Secondary | ICD-10-CM | POA: Diagnosis not present

## 2018-07-23 DIAGNOSIS — R0789 Other chest pain: Secondary | ICD-10-CM | POA: Diagnosis not present

## 2018-07-23 DIAGNOSIS — I2510 Atherosclerotic heart disease of native coronary artery without angina pectoris: Secondary | ICD-10-CM | POA: Diagnosis not present

## 2018-07-23 DIAGNOSIS — E669 Obesity, unspecified: Secondary | ICD-10-CM | POA: Diagnosis present

## 2018-07-23 DIAGNOSIS — Z720 Tobacco use: Secondary | ICD-10-CM | POA: Diagnosis present

## 2018-07-23 DIAGNOSIS — E1169 Type 2 diabetes mellitus with other specified complication: Secondary | ICD-10-CM | POA: Diagnosis not present

## 2018-07-23 DIAGNOSIS — N183 Chronic kidney disease, stage 3 unspecified: Secondary | ICD-10-CM | POA: Diagnosis present

## 2018-07-23 DIAGNOSIS — E785 Hyperlipidemia, unspecified: Secondary | ICD-10-CM | POA: Diagnosis present

## 2018-07-23 DIAGNOSIS — I10 Essential (primary) hypertension: Secondary | ICD-10-CM | POA: Diagnosis present

## 2018-07-23 DIAGNOSIS — E871 Hypo-osmolality and hyponatremia: Secondary | ICD-10-CM | POA: Insufficient documentation

## 2018-07-23 DIAGNOSIS — R5381 Other malaise: Secondary | ICD-10-CM | POA: Diagnosis not present

## 2018-07-23 DIAGNOSIS — E1122 Type 2 diabetes mellitus with diabetic chronic kidney disease: Secondary | ICD-10-CM | POA: Insufficient documentation

## 2018-07-23 DIAGNOSIS — F209 Schizophrenia, unspecified: Secondary | ICD-10-CM | POA: Diagnosis present

## 2018-07-23 DIAGNOSIS — R05 Cough: Secondary | ICD-10-CM | POA: Diagnosis not present

## 2018-07-23 DIAGNOSIS — Z79899 Other long term (current) drug therapy: Secondary | ICD-10-CM | POA: Diagnosis not present

## 2018-07-23 DIAGNOSIS — R0602 Shortness of breath: Secondary | ICD-10-CM | POA: Diagnosis not present

## 2018-07-23 DIAGNOSIS — F17210 Nicotine dependence, cigarettes, uncomplicated: Secondary | ICD-10-CM | POA: Insufficient documentation

## 2018-07-23 DIAGNOSIS — N1832 Chronic kidney disease, stage 3b: Secondary | ICD-10-CM | POA: Diagnosis present

## 2018-07-23 LAB — CBC WITH DIFFERENTIAL/PLATELET
Basophils Absolute: 0 10*3/uL (ref 0.0–0.1)
Basophils Relative: 0 %
Eosinophils Absolute: 0 10*3/uL (ref 0.0–0.7)
Eosinophils Relative: 0 %
HCT: 37.1 % — ABNORMAL LOW (ref 39.0–52.0)
Hemoglobin: 12.1 g/dL — ABNORMAL LOW (ref 13.0–17.0)
Lymphocytes Relative: 7 %
Lymphs Abs: 0.9 10*3/uL (ref 0.7–4.0)
MCH: 29 pg (ref 26.0–34.0)
MCHC: 32.6 g/dL (ref 30.0–36.0)
MCV: 89 fL (ref 78.0–100.0)
Monocytes Absolute: 0.5 10*3/uL (ref 0.1–1.0)
Monocytes Relative: 4 %
Neutro Abs: 11 10*3/uL — ABNORMAL HIGH (ref 1.7–7.7)
Neutrophils Relative %: 89 %
Platelets: 231 10*3/uL (ref 150–400)
RBC: 4.17 MIL/uL — ABNORMAL LOW (ref 4.22–5.81)
RDW: 13.6 % (ref 11.5–15.5)
WBC: 12.5 10*3/uL — ABNORMAL HIGH (ref 4.0–10.5)

## 2018-07-23 LAB — BASIC METABOLIC PANEL
Anion gap: 11 (ref 5–15)
BUN: 13 mg/dL (ref 8–23)
CO2: 22 mmol/L (ref 22–32)
Calcium: 9.8 mg/dL (ref 8.9–10.3)
Chloride: 91 mmol/L — ABNORMAL LOW (ref 98–111)
Creatinine, Ser: 1.42 mg/dL — ABNORMAL HIGH (ref 0.61–1.24)
GFR calc Af Amer: 57 mL/min — ABNORMAL LOW (ref >60)
GFR calc non Af Amer: 49 mL/min — ABNORMAL LOW (ref >60)
Glucose, Bld: 169 mg/dL — ABNORMAL HIGH (ref 70–99)
Potassium: 4.1 mmol/L (ref 3.5–5.1)
Sodium: 124 mmol/L — ABNORMAL LOW (ref 135–145)

## 2018-07-23 LAB — STREP PNEUMONIAE URINARY ANTIGEN: Strep Pneumo Urinary Antigen: NEGATIVE

## 2018-07-23 LAB — GLUCOSE, CAPILLARY
Glucose-Capillary: 208 mg/dL — ABNORMAL HIGH (ref 70–99)
Glucose-Capillary: 287 mg/dL — ABNORMAL HIGH (ref 70–99)
Glucose-Capillary: 274 mg/dL — ABNORMAL HIGH (ref 70–99)

## 2018-07-23 LAB — OSMOLALITY, URINE: Osmolality, Ur: 139 mOsm/kg — ABNORMAL LOW (ref 300–900)

## 2018-07-23 LAB — TSH: TSH: 1.182 u[IU]/mL (ref 0.350–4.500)

## 2018-07-23 LAB — SODIUM, URINE, RANDOM: Sodium, Ur: <10 mmol/L

## 2018-07-23 LAB — TROPONIN I: Troponin I: <0.03 ng/mL (ref <0.03)

## 2018-07-23 LAB — OSMOLALITY: Osmolality: 270 mOsm/kg — ABNORMAL LOW (ref 275–295)

## 2018-07-23 MED ORDER — ONDANSETRON HCL 4 MG/2ML IJ SOLN: 4.0000 mg | Freq: Four times a day (QID) | INTRAMUSCULAR | Status: DC | PRN

## 2018-07-23 MED ORDER — IPRATROPIUM-ALBUTEROL 0.5-2.5 (3) MG/3ML IN SOLN
3.0000 mL | Freq: Four times a day (QID) | RESPIRATORY_TRACT | Status: DC
  Administered 2018-07-23 – 2018-07-24 (×4): 3 mL via RESPIRATORY_TRACT
  Filled 2018-07-23 (×4): qty 3

## 2018-07-23 MED ORDER — TRAZODONE HCL 50 MG PO TABS: 50.0000 mg | ORAL_TABLET | Freq: Every evening | ORAL | Status: DC | PRN

## 2018-07-23 MED ORDER — GUAIFENESIN ER 600 MG PO TB12
600.0000 mg | ORAL_TABLET | Freq: Two times a day (BID) | ORAL | Status: DC
  Administered 2018-07-23 – 2018-07-24 (×3): 600 mg via ORAL
  Filled 2018-07-23 (×3): qty 1

## 2018-07-23 MED ORDER — INSULIN ASPART 100 UNIT/ML ~~LOC~~ SOLN
0.0000 [IU] | Freq: Three times a day (TID) | SUBCUTANEOUS | Status: DC
  Administered 2018-07-23: 5 [IU] via SUBCUTANEOUS
  Administered 2018-07-23: 8 [IU] via SUBCUTANEOUS
  Administered 2018-07-24: 3 [IU] via SUBCUTANEOUS
  Administered 2018-07-24: 8 [IU] via SUBCUTANEOUS
  Administered 2018-07-24: 3 [IU] via SUBCUTANEOUS

## 2018-07-23 MED ORDER — IPRATROPIUM-ALBUTEROL 0.5-2.5 (3) MG/3ML IN SOLN
3.0000 mL | Freq: Once | RESPIRATORY_TRACT | Status: AC
  Administered 2018-07-23: 3 mL via RESPIRATORY_TRACT
  Filled 2018-07-23: qty 3

## 2018-07-23 MED ORDER — ALBUTEROL SULFATE (2.5 MG/3ML) 0.083% IN NEBU
2.5000 mg | INHALATION_SOLUTION | Freq: Once | RESPIRATORY_TRACT | Status: AC
  Administered 2018-07-23: 2.5 mg via RESPIRATORY_TRACT
  Filled 2018-07-23: qty 3

## 2018-07-23 MED ORDER — IRBESARTAN 300 MG PO TABS
300.0000 mg | ORAL_TABLET | Freq: Every day | ORAL | Status: DC
  Administered 2018-07-23 – 2018-07-24 (×2): 300 mg via ORAL
  Filled 2018-07-23 (×2): qty 1

## 2018-07-23 MED ORDER — NICOTINE 14 MG/24HR TD PT24
14.0000 mg | MEDICATED_PATCH | Freq: Every day | TRANSDERMAL | Status: DC
  Administered 2018-07-23 – 2018-07-24 (×2): 14 mg via TRANSDERMAL
  Filled 2018-07-23 (×2): qty 1

## 2018-07-23 MED ORDER — BUSPIRONE HCL 5 MG PO TABS
7.5000 mg | ORAL_TABLET | Freq: Three times a day (TID) | ORAL | Status: DC
  Administered 2018-07-23 – 2018-07-24 (×3): 7.5 mg via ORAL
  Filled 2018-07-23 (×4): qty 2

## 2018-07-23 MED ORDER — ONDANSETRON HCL 4 MG PO TABS: 4.0000 mg | ORAL_TABLET | Freq: Four times a day (QID) | ORAL | Status: DC | PRN

## 2018-07-23 MED ORDER — BENZONATATE 100 MG PO CAPS
200.0000 mg | ORAL_CAPSULE | Freq: Three times a day (TID) | ORAL | Status: DC | PRN
  Administered 2018-07-23 – 2018-07-24 (×2): 200 mg via ORAL
  Filled 2018-07-23 (×2): qty 2

## 2018-07-23 MED ORDER — ALBUTEROL (5 MG/ML) CONTINUOUS INHALATION SOLN
7.5000 mg/h | INHALATION_SOLUTION | Freq: Once | RESPIRATORY_TRACT | Status: AC
  Administered 2018-07-23: 7.5 mg/h via RESPIRATORY_TRACT
  Filled 2018-07-23: qty 20

## 2018-07-23 MED ORDER — ACETAMINOPHEN 650 MG RE SUPP: 650.0000 mg | Freq: Four times a day (QID) | RECTAL | Status: DC | PRN

## 2018-07-23 MED ORDER — INSULIN ASPART 100 UNIT/ML ~~LOC~~ SOLN
0.0000 [IU] | Freq: Every day | SUBCUTANEOUS | Status: DC
  Administered 2018-07-23: 3 [IU] via SUBCUTANEOUS

## 2018-07-23 MED ORDER — PRAVASTATIN SODIUM 10 MG PO TABS
20.0000 mg | ORAL_TABLET | Freq: Every day | ORAL | Status: DC
  Administered 2018-07-23 – 2018-07-24 (×2): 20 mg via ORAL
  Filled 2018-07-23: qty 2
  Filled 2018-07-23: qty 1
  Filled 2018-07-23 (×2): qty 2

## 2018-07-23 MED ORDER — ENOXAPARIN SODIUM 60 MG/0.6ML ~~LOC~~ SOLN
50.0000 mg | SUBCUTANEOUS | Status: DC
  Administered 2018-07-23: 50 mg via SUBCUTANEOUS
  Filled 2018-07-23: qty 0.6

## 2018-07-23 MED ORDER — DOXYCYCLINE HYCLATE 100 MG PO TABS
100.0000 mg | ORAL_TABLET | Freq: Two times a day (BID) | ORAL | Status: DC
  Administered 2018-07-23 – 2018-07-24 (×3): 100 mg via ORAL
  Filled 2018-07-23 (×3): qty 1

## 2018-07-23 MED ORDER — RISPERIDONE 1 MG PO TABS
4.0000 mg | ORAL_TABLET | Freq: Every day | ORAL | Status: DC
  Administered 2018-07-23: 4 mg via ORAL
  Filled 2018-07-23: qty 4

## 2018-07-23 MED ORDER — HYDRALAZINE HCL 20 MG/ML IJ SOLN: 10.0000 mg | INTRAMUSCULAR | Status: DC | PRN

## 2018-07-23 MED ORDER — SODIUM CHLORIDE 0.9 % IV SOLN
INTRAVENOUS | Status: AC
  Administered 2018-07-23: 13:00:00 via INTRAVENOUS

## 2018-07-23 MED ORDER — METHYLPREDNISOLONE SODIUM SUCC 40 MG IJ SOLR
40.0000 mg | Freq: Two times a day (BID) | INTRAMUSCULAR | Status: DC
Start: 2018-07-23 — End: 2018-07-24
  Administered 2018-07-23 – 2018-07-24 (×2): 40 mg via INTRAVENOUS
  Filled 2018-07-23 (×3): qty 1

## 2018-07-23 MED ORDER — ASPIRIN EC 81 MG PO TBEC
81.0000 mg | DELAYED_RELEASE_TABLET | Freq: Every day | ORAL | Status: DC
  Administered 2018-07-23 – 2018-07-24 (×2): 81 mg via ORAL
  Filled 2018-07-23 (×2): qty 1

## 2018-07-23 MED ORDER — GLIPIZIDE ER 5 MG PO TB24
5.0000 mg | ORAL_TABLET | Freq: Every day | ORAL | Status: DC
  Administered 2018-07-24: 5 mg via ORAL
  Filled 2018-07-23: qty 1

## 2018-07-23 MED ORDER — ACETAMINOPHEN 325 MG PO TABS
650.0000 mg | ORAL_TABLET | Freq: Four times a day (QID) | ORAL | Status: DC | PRN
Start: 2018-07-23 — End: 2018-07-24

## 2018-07-23 MED ORDER — MOMETASONE FURO-FORMOTEROL FUM 200-5 MCG/ACT IN AERO
2.0000 | INHALATION_SPRAY | Freq: Two times a day (BID) | RESPIRATORY_TRACT | Status: DC
  Administered 2018-07-23 – 2018-07-24 (×2): 2 via RESPIRATORY_TRACT
  Filled 2018-07-23: qty 8.8

## 2018-07-23 MED ORDER — METHYLPREDNISOLONE SODIUM SUCC 125 MG IJ SOLR
125.0000 mg | Freq: Once | INTRAMUSCULAR | Status: AC
  Administered 2018-07-23: 125 mg via INTRAVENOUS
  Filled 2018-07-23: qty 2

## 2018-07-23 MED ORDER — BENZONATATE 100 MG PO CAPS
200.0000 mg | ORAL_CAPSULE | Freq: Once | ORAL | Status: AC
  Administered 2018-07-23: 200 mg via ORAL
  Filled 2018-07-23: qty 2

## 2018-07-23 NOTE — H&P (Signed)
History and Physical    Matthew Molina TJQ:[id number] DOB: Apr 21, 1951 DOA: 07/23/2018  PCP: Fayrene Helper, MD   Patient coming from: Home  Chief Complaint: Cough/SOB  HPI: Matthew Molina is a 67 y.o. male with medical history significant for obesity, type 2 diabetes, hypertension, dyslipidemia,schizophrenia, CAD, GERD, and COPD with ongoing tobacco abuse and 3 L nasal cannula as needed at home who was seen in the ED approximately 2 days ago for symptoms of worsening cough and shortness of breath and was sent home after having symptomatic improvement at that time.  He states that he has continued to have ongoing symptoms with productive cough with brown sputum and has had no relief using his home nebulizer treatments.  He also complains of some chest tightness but denies any fevers or chills.  He continues to smoke on a regular basis. Denies any chest pain, orthopnea, paroxysmal nocturnal dyspnea, or lower extremity edema.   ED Course: Vital signs are stable aside from some elevated heart rate with noted 109 bpm on telemetry.  EKG with no abnormal findings aside from some sinus tachycardia.  Laboratory data with mild leukocytosis of 12,500 along with serum sodium of 124 and creatinine 1.42 as well as glucose 169.  Chest x-ray with no acute abnormalities.  He has received some nebulized breathing treatments as well as IV steroids while in the ED with improvement noted.  He is in no respiratory distress and is currently saturating in the 90th percentile on room air.  Review of Systems: As per HPI otherwise 10 point review of systems negative.   Past Medical History:  Diagnosis Date  . Acute kidney injury ([place])   . Acute respiratory failure with hypoxia ([place])   . Arthritis   . COPD (chronic obstructive pulmonary disease) ([place])   . Depression   . Diabetes mellitus   . Diabetes mellitus without complication ([place])   . Hypercholesterolemia   . Hyperlipidemia   . Hypertension   . Multiple  lung nodules on CT 04/02/2015  . Nicotine addiction   . Obesity   . Oxygen deficiency    qhs  . Schizophrenia [facility])     Past Surgical History:  Procedure Laterality Date  . CATARACT EXTRACTION W/PHACO Left 11/20/2013   Procedure: CATARACT EXTRACTION PHACO AND INTRAOCULAR LENS PLACEMENT (IOC);  Surgeon: Tonny Branch, MD;  Location: [facility] ORS;  Service: Ophthalmology;  Laterality: Left;  CDE:10.26  . CATARACT EXTRACTION W/PHACO Right 12/08/2013   Procedure: RIGHT EYE CATARACT EXTRACTION PHACO AND INTRAOCULAR LENS PLACEMENT ;  Surgeon: Tonny Branch, MD;  Location: [facility] ORS;  Service: Ophthalmology;  Laterality: Right;  CDE 12.38  . COLONOSCOPY N/A 04/01/2013   Procedure: COLONOSCOPY;  Surgeon: Danie Binder, MD;  Location: [facility] ENDO SUITE;  Service: Endoscopy;  Laterality: N/A;  10:00 AM-moved to [place] notified pt  . COLONOSCOPY  2014   INCOMPLETE PREP IN R COLON  . ESOPHAGOGASTRODUODENOSCOPY N/A 02/18/2016   Procedure: ESOPHAGOGASTRODUODENOSCOPY (EGD);  Surgeon: Danie Binder, MD;  Location: [facility] SU[facility];  Service: Endoscopy;  Laterality: N/A;  1145  . EYE SURGERY Left 11/2013   cataract extraction  . SAVORY DILATION N/A 02/18/2016   Procedure: SAVORY DILATION;  Surgeon: Danie Binder, MD;  Location: [facility] END[facility] SU[facility];  Service: Endoscopy;  Laterality: N/A;  . SHOULDER SURGERY       reports that he has been smoking cigarettes.  He has a 48.00 pack-year smoking history. He has never used smokeless tobacco. He reports that  he does not drink alcohol or use drugs.  Allergies  Allergen Reactions  . Sertraline Hcl     Stomach upset/pain  . Wellbutrin [Bupropion] Other (See Comments)    Makes stomach hurt    Family History  Problem Relation Age of Onset  . Diabetes Mother   . Hypertension Mother   . Stroke Mother   . Diabetes Sister   . Heart disease Sister   . Kidney disease Father   . Drug abuse Brother   . Colon cancer Neg Hx   . Colon polyps Neg Hx     Prior to Admission  medications   Medication Sig Start Date End Date Taking? Authorizing Provider  albuterol (PROVENTIL HFA;VENTOLIN HFA) 108 (90 Base) MCG/ACT inhaler Inhale 2 puffs into the lungs every 6 (six) hours as needed for wheezing or shortness of breath. 04/16/18  Yes Fayrene Helper, MD  aspirin EC 81 MG tablet Take 81 mg by mouth daily.   Yes [provider]  budesonide-formoterol (SYMBICORT) 160-4.5 MCG/ACT inhaler Inhale 2 puffs into the lungs 2 (two) times daily. 04/16/18  Yes Fayrene Helper, MD  busPIRone (BUSPAR) 7.5 MG tablet TAKE 1 TABLET BY MOUTH 3 TIMES DAILY. 07/02/18  Yes Fayrene Helper, MD  glipiZIDE (GLIPIZIDE XL) 5 MG 24 hr tablet Take 1 tablet (5 mg total) by mouth daily with breakfast. 12/04/17  Yes Fayrene Helper, MD  ipratropium-albuterol (DUONEB) 0.5-2.5 (3) MG/3ML SOLN INHALE 1 VIAL VIA NEBULIZER EVERY FOUR HOURS AS NEEDED. 03/02/16  Yes Fayrene Helper, MD  olmesartan (BENICAR) 40 MG tablet TAKE 1 TABLET BY MOUTH ONCE A DAY. 07/11/18  Yes Fayrene Helper, MD  pravastatin (PRAVACHOL) 20 MG tablet TAKE (1) TABLET BY MOUTH AT BEDTIME FOR CHOLESTEROL. 07/22/18  Yes Fayrene Helper, MD  predniSONE (DELTASONE) 20 MG tablet Take 3 tablets (60 mg total) by mouth daily. 07/21/18  Yes Hayden Rasmussen, MD  risperiDONE (RISPERDAL) 1 MG tablet TAKE (1) TABLET BY MOUTH AT BEDTIME WITH 4MG. 07/19/18  Yes Fayrene Helper, MD  risperidone (RISPERDAL) 4 MG tablet TAKE (1) TABLET BY MOUTH AT BEDTIME. 07/11/18  Yes Fayrene Helper, MD  tiZANidine (ZANAFLEX) 2 MG tablet One tablet once daily as needed, for muscle spasm 05/22/18  Yes Fayrene Helper, MD  tiZANidine (ZANAFLEX) 2 MG tablet TAKE 1 TABLET ONCE DAILY AS NEEDED FOR MUSCLE SPASMS. 05/29/18  Yes Fayrene Helper, MD  ACCU-CHEK AVIVA PLUS test strip USE TO TEST ONCE DAILY 11/05/17   Fayrene Helper, MD  ACCU-CHEK SOFTCLIX LANCETS lancets TEST BLOOD SUGAR ONCE DAILY AS DIRECTED. 04/25/18   Fayrene Helper, MD  blood glucose meter kit and supplies Dispense based on patient and insurance preference.Requesting accuchek meter. Once daily testing dx e11.9 12/28/17   Fayrene Helper, MD  GLIPIZIDE XL 5 MG 24 hr tablet TAKE 1 TABLET DAILY WITH BREAKFAST. 07/02/18   Fayrene Helper, MD  GLIPIZIDE XL 5 MG 24 hr tablet TAKE 1 TABLET DAILY WITH BREAKFAST. 07/19/18   Fayrene Helper, MD  guaiFENesin (MUCINEX) 600 MG 12 hr tablet Take 1 tablet (600 mg total) by mouth 2 (two) times daily. 03/31/18   Kathie Dike, MD  SYMBICORT 80-4.5 MCG/ACT inhaler INHALE 2 PUFFS INTO LUNGS TWICE DAILY. 07/11/18   Fayrene Helper, MD  tiotropium (SPIRIVA) 18 MCG inhalation capsule Place 1 capsule (18 mcg total) into inhaler and inhale daily. 04/17/17   Fayrene Helper, MD  tiZANidine (ZANAFLEX) 2  MG tablet TAKE 1 TABLET ONCE DAILY AS NEEDED FOR MUSCLE SPASMS. 06/05/18   Fayrene Helper, MD  tiZANidine (ZANAFLEX) 2 MG tablet TAKE 1 TABLET ONCE DAILY AS NEEDED FOR MUSCLE SPASMS. 06/12/18   Fayrene Helper, MD  tiZANidine (ZANAFLEX) 2 MG tablet TAKE 1 TABLET ONCE DAILY AS NEEDED FOR MUSCLE SPASMS. 06/26/18   Fayrene Helper, MD  tiZANidine (ZANAFLEX) 2 MG tablet TAKE 1 TABLET ONCE DAILY AS NEEDED FOR MUSCLE SPASMS. 07/04/18   Fayrene Helper, MD  tiZANidine (ZANAFLEX) 2 MG tablet TAKE 1 TABLET ONCE DAILY AS NEEDED FOR MUSCLE SPASMS. 07/22/18   Fayrene Helper, MD  traZODone (DESYREL) 50 MG tablet TAKE ONE TABLET BY MOUTH AT BEDTIME AS NEEDED FOR SLEEP. 05/21/18   Fayrene Helper, MD  triamterene-hydrochlorothiazide [facility]) 37.5-25 MG tablet Half tablet daily 05/14/18   Fayrene Helper, MD  sildenafil (VIAGRA) 100 MG tablet Take 100 mg by mouth. Take one tablet 30 mins before intercourse   03/11/12  [provider]    Physical Exam: Vitals:   07/23/18 0914 07/23/18 1000 07/23/18 1043 07/23/18 1045  BP:  117/76 134/87   Pulse:  96 91 95  Resp:  20 (!) 21 (!) 25  Temp:        TempSrc:      SpO2: 96% 97% 96% 97%  Weight:      Height:        Constitutional: NAD, calm, comfortable Vitals:   07/23/18 0914 07/23/18 1000 07/23/18 1043 07/23/18 1045  BP:  117/76 134/87   Pulse:  96 91 95  Resp:  20 (!) 21 (!) 25  Temp:      TempSrc:      SpO2: 96% 97% 96% 97%  Weight:      Height:       Eyes: lids and conjunctivae normal ENMT: Mucous membranes are moist.  Neck: normal, supple Respiratory: clear to auscultation bilaterally. Normal respiratory effort. No accessory muscle use.  Cardiovascular: Regular rate and rhythm, no murmurs. No extremity edema. Abdomen: no tenderness, no distention. Bowel sounds positive.  Musculoskeletal:  No joint deformity upper and lower extremities.   Skin: no rashes, lesions, ulcers.  Psychiatric: Normal judgment and insight. Alert and oriented x 3. Normal mood.   Labs on Admission: I have personally reviewed following labs and imaging studies  CBC: Recent Labs  Lab 07/21/18 1617 07/23/18 0920  WBC 5.3 12.5*  NEUTROABS 2.6 11.0*  HGB 12.3* 12.1*  HCT 37.3* 37.1*  MCV 88.8 89.0  PLT 209 782   Basic Metabolic Panel: Recent Labs  Lab 07/21/18 1617 07/23/18 0920  NA 127* 124*  K 3.2* 4.1  CL 93* 91*  CO2 25 22  GLUCOSE 100* 169*  BUN 6* 13  CREATININE 1.31* 1.42*  CALCIUM 9.4 9.8   GFR: Estimated Creatinine Clearance: 64.4 mL/min (A) (by C-G formula based on SCr of 1.42 mg/dL (H)). Liver Function Tests: No results for input(s): AST, ALT, ALKPHOS, BILITOT, PROT, ALBUMIN in the last 168 hours. No results for input(s): LIPASE, AMYLASE in the last 168 hours. No results for input(s): AMMONIA in the last 168 hours. Coagulation Profile: No results for input(s): INR, PROTIME in the last 168 hours. Cardiac Enzymes: Recent Labs  Lab 07/21/18 1617 07/23/18 0920  TROPONINI <0.03 <0.03   BNP (last 3 results) No results for input(s): PROBNP in the last 8760 hours. HbA1C: No results for input(s): HGBA1C in the  last 72 hours. CBG: No results for input(s):  GLUCAP in the last 168 hours. Lipid Profile: No results for input(s): CHOL, HDL, LDLCALC, TRIG, CHOLHDL, LDLDIRECT in the last 72 hours. Thyroid Function Tests: No results for input(s): TSH, T4TOTAL, FREET4, T3FREE, THYROIDAB in the last 72 hours. Anemia Panel: No results for input(s): VITAMINB12, FOLATE, FERRITIN, TIBC, IRON, RETICCTPCT in the last 72 hours. Urine analysis:    Component Value Date/Time   COLORURINE lt. yellow 02/25/2009 1257   APPEARANCEUR Clear 02/25/2009 1257   LABSPEC 1.010 02/25/2009 1257   PHURINE 6.0 02/25/2009 1257   HGBUR negative 02/25/2009 1257   BILIRUBINUR negative 02/25/2009 1257   UROBILINOGEN 0.2 02/25/2009 1257   NITRITE negative 02/25/2009 1257    Radiological Exams on Admission: Dg Chest 2 View  Result Date: 07/21/2018 CLINICAL DATA:  Shortness of breath and cough for 4 days. History of smoking and COPD. EXAM: CHEST - 2 VIEW COMPARISON:  Radiographs 03/29/2018.  CT 05/17/2018. FINDINGS: The heart size and mediastinal contours are stable. The lungs are clear. There are probable residual small bilateral pleural effusions on the lateral view. The bones appear unremarkable. IMPRESSION: Stable chest with probable small residual bilateral pleural effusions. No acute cardiopulmonary process. Electronically Signed   By: Richardean Sale M.D.   On: 07/21/2018 16:16   Dg Chest Portable 1 View  Result Date: 07/23/2018 CLINICAL DATA:  Cough and chest tightness for 1 week EXAM: PORTABLE CHEST 1 VIEW COMPARISON:  07/21/2018 FINDINGS: Cardiac shadow is stable. Lungs are well aerated bilaterally. No focal confluent infiltrate is seen. No bony abnormality is noted. IMPRESSION: No acute abnormality noted. Electronically Signed   By: Inez Catalina M.D.   On: 07/23/2018 09:40    EKG: Independently reviewed. ST 109 bpm.  Assessment/Plan Principal Problem:   COPD exacerbation (HCC) Active Problems:   Hyperlipidemia    Schizophrenia, unspecified type ([place])   Diabetes mellitus type 2 in obese ([place])   Coronary atherosclerosis of native coronary artery   Tobacco abuse   Hypertension   CKD (chronic kidney disease) stage 3, GFR 30-59 ml/min (HCC)   GERD (gastroesophageal reflux disease)    1. Acute COPD exacerbation.  He has already had some improvement in the ED with current treatments, but continues to have some mild shortness of breath and chest tightness.  I will place on Solu-Medrol IV twice daily with duo nebs to continue as well as doxycycline.  Check urine strep pneumonia as well as Legionella.  No significant findings on chest x-ray noted.  Maintained on Dulera while here and can resume Symbicort at home. 2. Hyponatremia.  This appears to be chronic as he is otherwise asymptomatic from this.  He does have a thiazide medication at home which I will discontinue at this time and follow repeat renal panel.  Will place on some normal saline as well and fluid restrict.  Check urine and serum osmolarities as well as urine sodium.  Check TSH. 3. CKD stage III.  Appears to be at baseline.  Monitor with repeat renal panel. 4. Dyslipidemia.  Maintain on pravastatin. 5. Type 2 diabetes.  Maintain on home glipizide and start sliding scale insulin with close monitoring as he is receiving steroids. 6. Hypertension.  Hold Maxide due to hyponatremia and maintain on olmesartan and order hydralazine pushes as needed. 7. Schizophrenia.  Maintain on Risperdal. 8. GERD.  PPI. 9. Tobacco abuse.  Smoking cessation counseling.  Nicotine patch.   DVT prophylaxis: Lovenox Code Status: Full Family Communication: None at bedside Disposition Plan:AECOPD treatment with possible DC in am if  improved Consults called:None Admission status: Obs, Med-Surg   Collis Thede Darleen Crocker DO [facility] Pager [phone]  If 7PM-7AM, please contact night-coverage [email or web address] Password [email or web address]  07/23/2018, 11:00 AM

## 2018-07-23 NOTE — ED Triage Notes (Signed)
Here in ED 2 days ago for cough.  Return today again d/t cough.  Denies any pain.  C/o SOB and coughing.  O2 on RA 95%.

## 2018-07-23 NOTE — ED Provider Notes (Addendum)
[name] EMERGENCY DEPARTMENT Provider Note   CSN: [id number] Arrival date & time: 07/23/18  2376     History   Chief Complaint Chief Complaint  Patient presents with  . Cough    HPI RIELLY Molina is a 67 y.o. male.  HPI   Matthew Molina is a 67 y.o. male with past medical history of type 2 diabetes, schizophrenia, and COPD who presents to the Emergency Department complaining of persistent cough, chest tightness, and shortness of breath with wheezing.  He was seen here 2 days ago for same.  He states symptoms improved slightly upon discharge home but never resolved.  He has been using his albuterol inhaler at home without relief.  This morning, he reports persistent coughing with cough that is productive of white to brown sputum.  He denies chest pain, fever, chills, posttussive emesis and hemoptysis.  Patient continues to smoke.  He admits to recurrent episodes of COPD exacerbations and secondary hospitalizations and ER visits   Past Medical History:  Diagnosis Date  . Acute kidney injury ([place])   . Acute respiratory failure with hypoxia ([place])   . Arthritis   . COPD (chronic obstructive pulmonary disease) ([place])   . Depression   . Diabetes mellitus   . Diabetes mellitus without complication ([place])   . Hypercholesterolemia   . Hyperlipidemia   . Hypertension   . Multiple lung nodules on CT 04/02/2015  . Nicotine addiction   . Obesity   . Oxygen deficiency    qhs  . Schizophrenia [facility])     Patient Active Problem List   Diagnosis Date Noted  . Unsteady gait 04/18/2017  . COPD exacerbation ([place]) 02/05/2017  . Elevated PSA 10/01/2016  . External hemorrhoid, thrombosed 08/24/2016  . GERD (gastroesophageal reflux disease) 08/24/2016  . CKD (chronic kidney disease) stage 3, GFR 30-59 ml/min (HCC) 05/22/2016  . Dysphagia 01/05/2016  . Insomnia 01/05/2016  . COPD (chronic obstructive pulmonary disease) ([place]) 11/05/2015  . Hypertension 11/05/2015  . Tobacco abuse  07/24/2015  . Coronary atherosclerosis of native coronary artery 04/02/2015  . Multiple lung nodules on CT 04/02/2015  . GAD (generalized anxiety disorder) 07/04/2012  . Back pain with radiation 11/01/2011  . Diabetes mellitus type 2 in obese ([place]) 04/17/2011  . Hyperlipidemia 06/02/2008  . Obesity (BMI 30.0-34.9) 06/02/2008  . Schizophrenia, unspecified type ([place]) 06/02/2008    Past Surgical History:  Procedure Laterality Date  . CATARACT EXTRACTION W/PHACO Left 11/20/2013   Procedure: CATARACT EXTRACTION PHACO AND INTRAOCULAR LENS PLACEMENT (IOC);  Surgeon: Tonny Branch, MD;  Location: [facility] ORS;  Service: Ophthalmology;  Laterality: Left;  CDE:10.26  . CATARACT EXTRACTION W/PHACO Right 12/08/2013   Procedure: RIGHT EYE CATARACT EXTRACTION PHACO AND INTRAOCULAR LENS PLACEMENT ;  Surgeon: Tonny Branch, MD;  Location: [facility] ORS;  Service: Ophthalmology;  Laterality: Right;  CDE 12.38  . COLONOSCOPY N/A 04/01/2013   Procedure: COLONOSCOPY;  Surgeon: Danie Binder, MD;  Location: [facility] ENDO SUITE;  Service: Endoscopy;  Laterality: N/A;  10:00 AM-moved to [place] notified pt  . COLONOSCOPY  2014   INCOMPLETE PREP IN R COLON  . ESOPHAGOGASTRODUODENOSCOPY N/A 02/18/2016   Procedure: ESOPHAGOGASTRODUODENOSCOPY (EGD);  Surgeon: Danie Binder, MD;  Location: [facility] ENDO SUITE;  Service: Endoscopy;  Laterality: N/A;  1145  . EYE SURGERY Left 11/2013   cataract extraction  . SAVORY DILATION N/A 02/18/2016   Procedure: SAVORY DILATION;  Surgeon: Danie Binder, MD;  Location: [facility] ENDO SUITE;  Service: Endoscopy;  Laterality: N/A;  . SHOULDER SURGERY          Home Medications    Prior to Admission medications   Medication Sig Start Date End Date Taking? Authorizing Provider  albuterol (PROVENTIL HFA;VENTOLIN HFA) 108 (90 Base) MCG/ACT inhaler Inhale 2 puffs into the lungs every 6 (six) hours as needed for wheezing or shortness of breath. 04/16/18  Yes Fayrene Helper, MD  aspirin EC 81 MG tablet Take 81  mg by mouth daily.   Yes [provider]  budesonide-formoterol (SYMBICORT) 160-4.5 MCG/ACT inhaler Inhale 2 puffs into the lungs 2 (two) times daily. 04/16/18  Yes Fayrene Helper, MD  busPIRone (BUSPAR) 7.5 MG tablet TAKE 1 TABLET BY MOUTH 3 TIMES DAILY. 07/02/18  Yes Fayrene Helper, MD  glipiZIDE (GLIPIZIDE XL) 5 MG 24 hr tablet Take 1 tablet (5 mg total) by mouth daily with breakfast. 12/04/17  Yes Fayrene Helper, MD  ipratropium-albuterol (DUONEB) 0.5-2.5 (3) MG/3ML SOLN INHALE 1 VIAL VIA NEBULIZER EVERY FOUR HOURS AS NEEDED. 03/02/16  Yes Fayrene Helper, MD  olmesartan (BENICAR) 40 MG tablet TAKE 1 TABLET BY MOUTH ONCE A DAY. 07/11/18  Yes Fayrene Helper, MD  pravastatin (PRAVACHOL) 20 MG tablet TAKE (1) TABLET BY MOUTH AT BEDTIME FOR CHOLESTEROL. 07/22/18  Yes Fayrene Helper, MD  predniSONE (DELTASONE) 20 MG tablet Take 3 tablets (60 mg total) by mouth daily. 07/21/18  Yes Hayden Rasmussen, MD  risperiDONE (RISPERDAL) 1 MG tablet TAKE (1) TABLET BY MOUTH AT BEDTIME WITH 4MG. 07/19/18  Yes Fayrene Helper, MD  risperidone (RISPERDAL) 4 MG tablet TAKE (1) TABLET BY MOUTH AT BEDTIME. 07/11/18  Yes Fayrene Helper, MD  tiZANidine (ZANAFLEX) 2 MG tablet One tablet once daily as needed, for muscle spasm 05/22/18  Yes Fayrene Helper, MD  tiZANidine (ZANAFLEX) 2 MG tablet TAKE 1 TABLET ONCE DAILY AS NEEDED FOR MUSCLE SPASMS. 05/29/18  Yes Fayrene Helper, MD  ACCU-CHEK AVIVA PLUS test strip USE TO TEST ONCE DAILY 11/05/17   Fayrene Helper, MD  ACCU-CHEK SOFTCLIX LANCETS lancets TEST BLOOD SUGAR ONCE DAILY AS DIRECTED. 04/25/18   Fayrene Helper, MD  blood glucose meter kit and supplies Dispense based on patient and insurance preference.Requesting accuchek meter. Once daily testing dx e11.9 12/28/17   Fayrene Helper, MD  GLIPIZIDE XL 5 MG 24 hr tablet TAKE 1 TABLET DAILY WITH BREAKFAST. 07/02/18   Fayrene Helper, MD  GLIPIZIDE XL 5 MG 24 hr  tablet TAKE 1 TABLET DAILY WITH BREAKFAST. 07/19/18   Fayrene Helper, MD  guaiFENesin (MUCINEX) 600 MG 12 hr tablet Take 1 tablet (600 mg total) by mouth 2 (two) times daily. 03/31/18   Kathie Dike, MD  SYMBICORT 80-4.5 MCG/ACT inhaler INHALE 2 PUFFS INTO LUNGS TWICE DAILY. 07/11/18   Fayrene Helper, MD  tiotropium (SPIRIVA) 18 MCG inhalation capsule Place 1 capsule (18 mcg total) into inhaler and inhale daily. 04/17/17   Fayrene Helper, MD  tiZANidine (ZANAFLEX) 2 MG tablet TAKE 1 TABLET ONCE DAILY AS NEEDED FOR MUSCLE SPASMS. 06/05/18   Fayrene Helper, MD  tiZANidine (ZANAFLEX) 2 MG tablet TAKE 1 TABLET ONCE DAILY AS NEEDED FOR MUSCLE SPASMS. 06/12/18   Fayrene Helper, MD  tiZANidine (ZANAFLEX) 2 MG tablet TAKE 1 TABLET ONCE DAILY AS NEEDED FOR MUSCLE SPASMS. 06/26/18   Fayrene Helper, MD  tiZANidine (ZANAFLEX) 2 MG tablet TAKE 1 TABLET ONCE DAILY AS NEEDED FOR MUSCLE SPASMS. 07/04/18  Fayrene Helper, MD  tiZANidine (ZANAFLEX) 2 MG tablet TAKE 1 TABLET ONCE DAILY AS NEEDED FOR MUSCLE SPASMS. 07/22/18   Fayrene Helper, MD  traZODone (DESYREL) 50 MG tablet TAKE ONE TABLET BY MOUTH AT BEDTIME AS NEEDED FOR SLEEP. 05/21/18   Fayrene Helper, MD  triamterene-hydrochlorothiazide [facility]) 37.5-25 MG tablet Half tablet daily 05/14/18   Fayrene Helper, MD  sildenafil (VIAGRA) 100 MG tablet Take 100 mg by mouth. Take one tablet 30 mins before intercourse   03/11/12  [provider]    Family History Family History  Problem Relation Age of Onset  . Diabetes Mother   . Hypertension Mother   . Stroke Mother   . Diabetes Sister   . Heart disease Sister   . Kidney disease Father   . Drug abuse Brother   . Colon cancer Neg Hx   . Colon polyps Neg Hx     Social History Social History   Tobacco Use  . Smoking status: Current Every Day Smoker    Packs/day: 1.00    Years: 48.00    Pack years: 48.00    Types: Cigarettes    Last attempt to quit:  05/02/2017    Years since quitting: 1.2  . Smokeless tobacco: Never Used  . Tobacco comment: quit a week ago  Substance Use Topics  . Alcohol use: No    Alcohol/week: 0.0 oz  . Drug use: No     Allergies   Sertraline hcl and Wellbutrin [bupropion]   Review of Systems Review of Systems  Constitutional: Negative for appetite change, chills and fever.  HENT: Negative for congestion, sore throat and trouble swallowing.   Respiratory: Positive for cough, chest tightness and wheezing. Negative for shortness of breath.   Cardiovascular: Negative for chest pain and leg swelling.  Gastrointestinal: Negative for abdominal pain, nausea and vomiting.  Genitourinary: Negative for dysuria.  Musculoskeletal: Negative for arthralgias.  Skin: Negative for rash.  Neurological: Negative for dizziness, weakness and numbness.  Hematological: Negative for adenopathy.  All other systems reviewed and are negative.    Physical Exam Updated Vital Signs BP 128/87 (BP Location: Left Arm)   Pulse (!) 109   Temp 99.1 F (37.3 C) (Oral)   Resp (!) 21   Ht _0  (1.854 m)   Wt 108.9 kg (240 lb)   SpO2 96%   BMI 31.66 kg/m   Physical Exam  Constitutional: He appears well-developed and well-nourished. No distress.  HENT:  Head: Normocephalic and atraumatic.  Right Ear: Tympanic membrane and ear canal normal.  Left Ear: Tympanic membrane and ear canal normal.  Mouth/Throat: Uvula is midline, oropharynx is clear and moist and mucous membranes are normal. No oropharyngeal exudate.  Eyes: Pupils are equal, round, and reactive to light. EOM are normal.  Neck: Normal range of motion, full passive range of motion without pain and phonation normal. Neck supple.  Cardiovascular: Normal rate, regular rhythm, normal heart sounds and intact distal pulses.  No murmur heard. Pulmonary/Chest: Effort normal. No stridor. No respiratory distress. He has wheezes. He has no rales. He exhibits no tenderness.  Coarse  lung sounds bilaterally, patient actively coughing.  Several expiratory wheezes bilaterally.   Abdominal: Soft. He exhibits no distension. There is no tenderness.  Musculoskeletal: He exhibits no edema.  Lymphadenopathy:    He has no cervical adenopathy.  Neurological: He is alert. No sensory deficit.  Skin: Skin is warm and dry. Capillary refill takes less than 2 seconds.  Psychiatric:  He has a normal mood and affect.  Nursing note and vitals reviewed.    ED Treatments / Results  Labs (all labs ordered are listed, but only abnormal results are displayed) Labs Reviewed  BASIC METABOLIC PANEL - Abnormal; Notable for the following components:      Result Value   Sodium 124 (*)    Chloride 91 (*)    Glucose, Bld 169 (*)    Creatinine, Ser 1.42 (*)    GFR calc non Af Amer 49 (*)    GFR calc Af Amer 57 (*)    All other components within normal limits  CBC WITH DIFFERENTIAL/PLATELET - Abnormal; Notable for the following components:   WBC 12.5 (*)    RBC 4.17 (*)    Hemoglobin 12.1 (*)    HCT 37.1 (*)    Neutro Abs 11.0 (*)    All other components within normal limits  TROPONIN I    EKG None  Radiology Dg Chest 2 View  Result Date: 07/21/2018 CLINICAL DATA:  Shortness of breath and cough for 4 days. History of smoking and COPD. EXAM: CHEST - 2 VIEW COMPARISON:  Radiographs 03/29/2018.  CT 05/17/2018. FINDINGS: The heart size and mediastinal contours are stable. The lungs are clear. There are probable residual small bilateral pleural effusions on the lateral view. The bones appear unremarkable. IMPRESSION: Stable chest with probable small residual bilateral pleural effusions. No acute cardiopulmonary process. Electronically Signed   By: Richardean Sale M.D.   On: 07/21/2018 16:16   Dg Chest Portable 1 View  Result Date: 07/23/2018 CLINICAL DATA:  Cough and chest tightness for 1 week EXAM: PORTABLE CHEST 1 VIEW COMPARISON:  07/21/2018 FINDINGS: Cardiac shadow is stable. Lungs are  well aerated bilaterally. No focal confluent infiltrate is seen. No bony abnormality is noted. IMPRESSION: No acute abnormality noted. Electronically Signed   By: Inez Catalina M.D.   On: 07/23/2018 09:40    Procedures Procedures (including critical care time)  Medications Ordered in ED Medications  methylPREDNISolone sodium succinate (SOLU-MEDROL) 125 mg/2 mL injection 125 mg (125 mg Intravenous Given 07/23/18 0922)  ipratropium-albuterol (DUONEB) 0.5-2.5 (3) MG/3ML nebulizer solution 3 mL (3 mLs Nebulization Given 07/23/18 0914)  albuterol (PROVENTIL) (2.5 MG/3ML) 0.083% nebulizer solution 2.5 mg (2.5 mg Nebulization Given 07/23/18 0914)     Initial Impression / Assessment and Plan / ED Course  I have reviewed the triage vital signs and the nursing notes.  Pertinent labs & imaging results that were available during my care of the patient were reviewed by me and considered in my medical decision making (see chart for details).     Patient with history of COPD with frequent exacerbations.  Seen here 2 days ago for same.  Not improving at home.  On exam patient actively coughing with expiratory wheezes and speech is somewhat labored.  No hypoxia.  Will give nebs and Solu-Medrol and reevaluate.  Patient states that he feels as though he needs to be admitted.  CRITICAL CARE Performed by: Maverick Patman Total critical care time: 30 minutes Critical care time was exclusive of separately billable procedures and treating other patients. Critical care was necessary to treat or prevent imminent or life-threatening deterioration. Critical care was time spent personally by me on the following activities: development of treatment plan with patient and/or surrogate as well as nursing, discussions with consultants, evaluation of patient's response to treatment, examination of patient, obtaining history from patient or surrogate, ordering and performing treatments and interventions, ordering and review  of  laboratory studies, ordering and review of radiographic studies, pulse oximetry and re-evaluation of patient's condition.   1020 on recheck, lung sounds somewhat improving, speech now less labored.  Will consult hospitalist for admit, continuous neb ordered  1040  Consulted hospitalist, Dr. Manuella Ghazi, will admit.    Final Clinical Impressions(s) / ED Diagnoses   Final diagnoses:  COPD exacerbation [facility])    ED Discharge Orders    None       Kem Parkinson, PA-C 07/23/18 [place], [place], DO 07/26/18 [id number]    Kem Parkinson, PA-C 08/05/18 1927    Francine Graven, DO 08/07/18 1609

## 2018-07-24 DIAGNOSIS — E1169 Type 2 diabetes mellitus with other specified complication: Secondary | ICD-10-CM

## 2018-07-24 DIAGNOSIS — I10 Essential (primary) hypertension: Secondary | ICD-10-CM

## 2018-07-24 DIAGNOSIS — E669 Obesity, unspecified: Secondary | ICD-10-CM

## 2018-07-24 DIAGNOSIS — J441 Chronic obstructive pulmonary disease with (acute) exacerbation: Secondary | ICD-10-CM

## 2018-07-24 DIAGNOSIS — Z720 Tobacco use: Secondary | ICD-10-CM

## 2018-07-24 DIAGNOSIS — E785 Hyperlipidemia, unspecified: Secondary | ICD-10-CM

## 2018-07-24 DIAGNOSIS — N183 Chronic kidney disease, stage 3 (moderate): Secondary | ICD-10-CM

## 2018-07-24 DIAGNOSIS — F209 Schizophrenia, unspecified: Secondary | ICD-10-CM

## 2018-07-24 DIAGNOSIS — J9601 Acute respiratory failure with hypoxia: Secondary | ICD-10-CM | POA: Diagnosis not present

## 2018-07-24 LAB — BASIC METABOLIC PANEL
Anion gap: 10 (ref 5–15)
BUN: 18 mg/dL (ref 8–23)
CO2: 25 mmol/L (ref 22–32)
Calcium: 9.9 mg/dL (ref 8.9–10.3)
Chloride: 94 mmol/L — ABNORMAL LOW (ref 98–111)
Creatinine, Ser: 1.32 mg/dL — ABNORMAL HIGH (ref 0.61–1.24)
GFR calc Af Amer: >60 mL/min (ref >60)
GFR calc non Af Amer: 54 mL/min — ABNORMAL LOW (ref >60)
Glucose, Bld: 194 mg/dL — ABNORMAL HIGH (ref 70–99)
Potassium: 4.1 mmol/L (ref 3.5–5.1)
Sodium: 129 mmol/L — ABNORMAL LOW (ref 135–145)

## 2018-07-24 LAB — CBC
HCT: 38.3 % — ABNORMAL LOW (ref 39.0–52.0)
Hemoglobin: 12.6 g/dL — ABNORMAL LOW (ref 13.0–17.0)
MCH: 29.6 pg (ref 26.0–34.0)
MCHC: 32.9 g/dL (ref 30.0–36.0)
MCV: 90.1 fL (ref 78.0–100.0)
Platelets: 246 10*3/uL (ref 150–400)
RBC: 4.25 MIL/uL (ref 4.22–5.81)
RDW: 13.6 % (ref 11.5–15.5)
WBC: 14.7 10*3/uL — ABNORMAL HIGH (ref 4.0–10.5)

## 2018-07-24 LAB — GLUCOSE, CAPILLARY
Glucose-Capillary: 151 mg/dL — ABNORMAL HIGH (ref 70–99)
Glucose-Capillary: 258 mg/dL — ABNORMAL HIGH (ref 70–99)
Glucose-Capillary: 190 mg/dL — ABNORMAL HIGH (ref 70–99)

## 2018-07-24 LAB — HIV ANTIBODY (ROUTINE TESTING W REFLEX): HIV Screen 4th Generation wRfx: NONREACTIVE

## 2018-07-24 MED ORDER — NICOTINE 14 MG/24HR TD PT24: 14.0000 mg | MEDICATED_PATCH | Freq: Every day | TRANSDERMAL | 2 refills | Status: DC

## 2018-07-24 MED ORDER — BENZONATATE 200 MG PO CAPS: 200.0000 mg | ORAL_CAPSULE | Freq: Three times a day (TID) | ORAL | 0 refills | Status: DC | PRN

## 2018-07-24 MED ORDER — PREDNISONE 20 MG PO TABS: ORAL_TABLET | ORAL | 0 refills | Status: DC

## 2018-07-24 MED ORDER — DOXYCYCLINE HYCLATE 100 MG PO TABS: 100.0000 mg | ORAL_TABLET | Freq: Two times a day (BID) | ORAL | 0 refills | Status: DC

## 2018-07-24 NOTE — Progress Notes (Signed)
Patient discharged home with personal belongings, prescriptions, and IV removed and site intact.  

## 2018-07-24 NOTE — Progress Notes (Signed)
Inpatient Diabetes Program Recommendations  AACE/ADA: New Consensus Statement on Inpatient Glycemic Control (2015)  Target Ranges:  Prepandial:   less than 140 mg/dL      Peak postprandial:   less than 180 mg/dL (1-2 hours)      Critically ill patients:  140 - 180 mg/dL   Lab Results  Component Value Date   GLUCAP 151 (H) 07/24/2018   HGBA1C 7.0 (H) 05/02/2018    Review of Glycemic Control  Diabetes history: DM2 Outpatient Diabetes medications: Glipizide 5 qd Current orders for Inpatient glycemic control: Glucotrol 5 mg qd + Novolog moderate correction tid + hs  Inpatient Diabetes Program Recommendations:   -Add Levemir 10 units qd while in hospital on steroids -Hold Glucotrol while in the hospital  Thank you, Nani Gasser. Pacey Altizer, RN, MSN, CDE  Diabetes Coordinator Inpatient Glycemic Control Team Team Pager [phone] (8am-5pm) 07/24/2018 8:57 AM

## 2018-07-24 NOTE — Care Management Obs Status (Signed)
[place] NOTIFICATION   Patient Details  Name: Matthew Molina MRN: [id number] Date of Birth: 1951-01-04   Medicare Observation Status Notification Given:  Yes    Lanny Donoso, Chauncey [place], RN 07/24/2018, 11:04 AM

## 2018-07-24 NOTE — Discharge Summary (Signed)
Physician Discharge Summary  KEEDAN SAMPLE DPO:[id number] DOB: [birth year] DOA: 07/23/2018  PCP: Fayrene Helper, MD  Admit date: 07/23/2018 Discharge date: 07/24/2018  Time spent: 35 minutes  Recommendations for Outpatient Follow-up:  1. Reassess blood pressure and further adjust antihypertensive regimen. 2. Repeat basic metabolic panel to follow electrolytes and renal function. 3. Close follow-up the patient CBGs with further adjustment into hypoglycemic regimen as needed.   Discharge Diagnoses:  Acute respiratory failure with hypoxia COPD exacerbation (HCC) Hyperlipidemia Schizophrenia, unspecified type ([place]) Diabetes mellitus type 2 with nephropathy ([place]) Tobacco abuse Hypertension CKD (chronic kidney disease) stage 3, GFR 30-59 ml/min (HCC) GERD (gastroesophageal reflux disease) Hyponatremia  Discharge Condition: Stable and improved.  Patient discharged home with instruction to follow-up with PCP in 10 days.  No oxygen supplementation needed at discharge.  Diet recommendation: Heart healthy and modified carbohydrate diet.  Filed Weights   07/23/18 0837 07/23/18 1209  Weight: 108.9 kg (240 lb) 108.9 kg (240 lb)    History of present illness:  As per H&P written by Dr. Manuella Ghazi on 07/23/18 67 y.o. male with medical history significant for obesity, type 2 diabetes, hypertension, dyslipidemia,schizophrenia, CAD, GERD, and COPD with ongoing tobacco abuse and 3 L nasal cannula as needed at home who was seen in the ED approximately 2 days ago for symptoms of worsening cough and shortness of breath and was sent home after having symptomatic improvement at that time.  He states that he has continued to have ongoing symptoms with productive cough with brown sputum and has had no relief using his home nebulizer treatments.  He also complains of some chest tightness but denies any fevers or chills.  He continues to smoke on a regular basis. Denies any chest pain, orthopnea, paroxysmal  nocturnal dyspnea, or lower extremity edema.  Hospital Course:  1-Acute respiratory failure with hypoxia in the setting of COPD exacerbation and bronchiectasis. -Patient with a significant improvement after receiving IV Solu-Medrol, duonebs and doxycycline. -At the moment of discharge she is no longer requiring oxygen supplementation, able to speak in full sentences and denying any shortness of breath. -Will discharge with steroids tapering, instructions to complete antibiotic therapy and to resume home nebulizer/inhaler regimen. -tessalon pearls ordered to use as needed for coughing spells.   2-hyponatremia: -Appears to be chronic -Patient is asymptomatic. -Sodium level at discharge 129. -Work-up essentially normal for this matter. -Advised to keep himself well-hydrated. -We will resume home chronic HCTZ for blood pressure control and will ask PCP to reassess basic metabolic panel and adjust antihypertensive regimen as needed.  3-chronic kidney disease is stage III -Appears to be at baseline. -Patient advised to keep himself well-hydrated.  4-HLD: -continue Pravachol  5-GERD: -continue PPI  6-type 2 diabetes mellitus with nephropathy: -Continue the use of glipizide for blood sugar control -Patient advised to follow modified carbohydrates. -Anticipate mild elevation of his CBGs while taking steroids. -Outpatient follow-up of his blood sugar levels with further adjustment on hypoglycemic regimen as needed.  7-tobacco abuse: -I have discussed tobacco cessation with the patient.  I have counseled the patient regarding the negative impacts of continued tobacco use including but not limited to lung cancer, COPD, and cardiovascular disease.  I have discussed alternatives to tobacco and modalities that may help facilitate tobacco cessation including but not limited to biofeedback, hypnosis, and medications.  Total time spent with tobacco counseling was 5 minutes -nicotine patch  ordered.  8-schizophrenia -continue risperdal -mood stable.  Procedures:  See below for x-ray reports.  Consultations:  None  Discharge Exam: Vitals:   07/24/18 1331 07/24/18 1410  BP: 132/82   Pulse: 98   Resp: 19   Temp: 97.7 F (36.5 C)   SpO2: 97% 95%    General: Afebrile, denies chest pain, no nausea or vomiting.  Reporting significant improvement in his breathing and able to speak in full sentences. Cardiovascular: S1 and S2, no rubs, no gallops; No JVD. Respiratory: Improved air movement bilaterally, positive for scattered rhonchi and mild end exp wheezing; no using accessory muscles.   Discharge Instructions   Discharge Instructions    Diet - low sodium heart healthy   Complete by:  As directed    Diet Carb Modified   Complete by:  As directed    Discharge instructions   Complete by:  As directed    Stop smoking Take medication as prescribed Keep yourself well hydrated and follow modified carbohydrate diet. Arrange follow-up with PCP in 10 days.   Increase activity slowly   Complete by:  As directed      Allergies as of 07/24/2018      Reactions   Sertraline Hcl    Stomach upset/pain   Wellbutrin [bupropion] Other (See Comments)   Makes stomach hurt      Medication List    STOP taking these medications   guaiFENesin 600 MG 12 hr tablet Commonly known as:  MUCINEX     TAKE these medications   ACCU-CHEK AVIVA PLUS test strip Generic drug:  glucose blood USE TO TEST ONCE DAILY   ACCU-CHEK SOFTCLIX LANCETS lancets TEST BLOOD SUGAR ONCE DAILY AS DIRECTED.   albuterol 108 (90 Base) MCG/ACT inhaler Commonly known as:  PROVENTIL HFA;VENTOLIN HFA Inhale 2 puffs into the lungs every 6 (six) hours as needed for wheezing or shortness of breath.   aspirin EC 81 MG tablet Take 81 mg by mouth daily.   benzonatate 200 MG capsule Commonly known as:  TESSALON Take 1 capsule (200 mg total) by mouth 3 (three) times daily as needed for cough.   blood  glucose meter kit and supplies Dispense based on patient and insurance preference.Requesting accuchek meter. Once daily testing dx e11.9   budesonide-formoterol 160-4.5 MCG/ACT inhaler Commonly known as:  SYMBICORT Inhale 2 puffs into the lungs 2 (two) times daily. What changed:  Another medication with the same name was removed. Continue taking this medication, and follow the directions you see here.   busPIRone 7.5 MG tablet Commonly known as:  BUSPAR TAKE 1 TABLET BY MOUTH 3 TIMES DAILY.   doxycycline 100 MG tablet Commonly known as:  VIBRA-TABS Take 1 tablet (100 mg total) by mouth every 12 (twelve) hours.   glipiZIDE 5 MG 24 hr tablet Commonly known as:  GLIPIZIDE XL Take 1 tablet (5 mg total) by mouth daily with breakfast. What changed:  Another medication with the same name was removed. Continue taking this medication, and follow the directions you see here.   ipratropium-albuterol 0.5-2.5 (3) MG/3ML Soln Commonly known as:  DUONEB INHALE 1 VIAL VIA NEBULIZER EVERY FOUR HOURS AS NEEDED.   nicotine 14 mg/24hr patch Commonly known as:  NICODERM CQ - dosed in mg/24 hours Place 1 patch (14 mg total) onto the skin daily. Start taking on:  07/25/2018   olmesartan 40 MG tablet Commonly known as:  BENICAR TAKE 1 TABLET BY MOUTH ONCE A DAY.   pravastatin 20 MG tablet Commonly known as:  PRAVACHOL TAKE (1) TABLET BY MOUTH AT BEDTIME FOR CHOLESTEROL.   predniSONE 20 MG  tablet Commonly known as:  DELTASONE Take 3 tablets by mouth daily x2 days; then 2 tablets by mouth daily x2 days; then 1 tablet by mouth daily x3 days; then half tablet by mouth daily x3 days and stop prednisone. What changed:    how much to take  how to take this  when to take this  additional instructions   risperidone 4 MG tablet Commonly known as:  RISPERDAL TAKE (1) TABLET BY MOUTH AT BEDTIME.   risperiDONE 1 MG tablet Commonly known as:  RISPERDAL TAKE (1) TABLET BY MOUTH AT BEDTIME WITH 4MG.    tiotropium 18 MCG inhalation capsule Commonly known as:  SPIRIVA Place 1 capsule (18 mcg total) into inhaler and inhale daily.   tiZANidine 2 MG tablet Commonly known as:  ZANAFLEX One tablet once daily as needed, for muscle spasm   traZODone 50 MG tablet Commonly known as:  DESYREL TAKE ONE TABLET BY MOUTH AT BEDTIME AS NEEDED FOR SLEEP.   triamterene-hydrochlorothiazide 37.5-25 MG tablet Commonly known as:  MAXZIDE-25 Half tablet daily      Allergies  Allergen Reactions  . Sertraline Hcl     Stomach upset/pain  . Wellbutrin [Bupropion] Other (See Comments)    Makes stomach hurt   Follow-up Information    Fayrene Helper, MD. Schedule an appointment as soon as possible for a visit in 10 day(s).   Specialty:  Family Medicine Contact information: [address] [place] [address] [phone]           The results of significant diagnostics from this hospitalization (including imaging, microbiology, ancillary and laboratory) are listed below for reference.    Significant Diagnostic Studies: Dg Chest 2 View  Result Date: 07/21/2018 CLINICAL DATA:  Shortness of breath and cough for 4 days. History of smoking and COPD. EXAM: CHEST - 2 VIEW COMPARISON:  Radiographs 03/29/2018.  CT 05/17/2018. FINDINGS: The heart size and mediastinal contours are stable. The lungs are clear. There are probable residual small bilateral pleural effusions on the lateral view. The bones appear unremarkable. IMPRESSION: Stable chest with probable small residual bilateral pleural effusions. No acute cardiopulmonary process. Electronically Signed   By: Richardean Sale M.D.   On: 07/21/2018 16:16   Dg Chest Portable 1 View  Result Date: 07/23/2018 CLINICAL DATA:  Cough and chest tightness for 1 week EXAM: PORTABLE CHEST 1 VIEW COMPARISON:  07/21/2018 FINDINGS: Cardiac shadow is stable. Lungs are well aerated bilaterally. No focal confluent infiltrate is seen. No bony abnormality is  noted. IMPRESSION: No acute abnormality noted. Electronically Signed   By: Inez Catalina M.D.   On: 07/23/2018 09:40   Labs: Basic Metabolic Panel: Recent Labs  Lab 07/21/18 1617 07/23/18 0920 07/24/18 0436  NA 127* 124* 129*  K 3.2* 4.1 4.1  CL 93* 91* 94*  CO2 _0 GLUCOSE 100* 169* 194*  BUN 6* 13 18  CREATININE 1.31* 1.42* 1.32*  CALCIUM 9.4 9.8 9.9   CBC: Recent Labs  Lab 07/21/18 1617 07/23/18 0920 07/24/18 0436  WBC 5.3 12.5* 14.7*  NEUTROABS 2.6 11.0*  --   HGB 12.3* 12.1* 12.6*  HCT 37.3* 37.1* 38.3*  MCV 88.8 89.0 90.1  PLT 209 231 246   Cardiac Enzymes: Recent Labs  Lab 07/21/18 1617 07/23/18 0920  TROPONINI <0.03 <0.03   BNP: BNP (last 3 results) Recent Labs    03/29/18 1944 07/21/18 1617  BNP 17.0 13.0   CBG: Recent Labs  Lab 07/23/18 1629 07/23/18  2054 07/24/18 0751 07/24/18 1158 07/24/18 1623  GLUCAP 287* 274* 151* 258* 190*    Signed:  Barton Dubois MD.  [facility] 07/24/2018, 5:11 PM

## 2018-07-25 ENCOUNTER — Other Ambulatory Visit: Payer: Self-pay | Admitting: Family Medicine

## 2018-07-25 LAB — LEGIONELLA PNEUMOPHILA SEROGP 1 UR AG: L. pneumophila Serogp 1 Ur Ag: NEGATIVE

## 2018-07-26 ENCOUNTER — Telehealth: Payer: Self-pay

## 2018-07-26 NOTE — Telephone Encounter (Signed)
[id number]  Attempted to contact patient to complete [facility] telephone call. Phone unable to receive calls at this time. Will try again later. 1st attempt

## 2018-07-30 ENCOUNTER — Telehealth: Payer: Self-pay | Admitting: Family Medicine

## 2018-07-30 DIAGNOSIS — J42 Unspecified chronic bronchitis: Secondary | ICD-10-CM

## 2018-07-30 NOTE — Telephone Encounter (Signed)
Pt is calling and needs a Nebulizer RX sent to [phone], he needs a large hole for Cord

## 2018-07-31 ENCOUNTER — Other Ambulatory Visit: Payer: Self-pay | Admitting: Family Medicine

## 2018-08-01 DIAGNOSIS — J441 Chronic obstructive pulmonary disease with (acute) exacerbation: Secondary | ICD-10-CM | POA: Diagnosis not present

## 2018-08-01 DIAGNOSIS — J449 Chronic obstructive pulmonary disease, unspecified: Secondary | ICD-10-CM | POA: Diagnosis not present

## 2018-08-01 NOTE — Telephone Encounter (Signed)
Done

## 2018-08-01 NOTE — Addendum Note (Signed)
Addended by: Eual Fines on: 08/01/2018 09:22 AM   Modules accepted: Orders

## 2018-08-02 ENCOUNTER — Encounter: Payer: Self-pay | Admitting: Podiatry

## 2018-08-02 ENCOUNTER — Ambulatory Visit (INDEPENDENT_AMBULATORY_CARE_PROVIDER_SITE_OTHER): Payer: Medicare Other | Admitting: Podiatry

## 2018-08-02 DIAGNOSIS — Q828 Other specified congenital malformations of skin: Secondary | ICD-10-CM | POA: Diagnosis not present

## 2018-08-02 DIAGNOSIS — E1140 Type 2 diabetes mellitus with diabetic neuropathy, unspecified: Secondary | ICD-10-CM

## 2018-08-02 DIAGNOSIS — M2141 Flat foot [pes planus] (acquired), right foot: Secondary | ICD-10-CM | POA: Diagnosis not present

## 2018-08-02 DIAGNOSIS — M79676 Pain in unspecified toe(s): Secondary | ICD-10-CM | POA: Diagnosis not present

## 2018-08-02 DIAGNOSIS — B351 Tinea unguium: Secondary | ICD-10-CM

## 2018-08-02 DIAGNOSIS — E1149 Type 2 diabetes mellitus with other diabetic neurological complication: Secondary | ICD-10-CM | POA: Diagnosis not present

## 2018-08-02 DIAGNOSIS — M2142 Flat foot [pes planus] (acquired), left foot: Secondary | ICD-10-CM

## 2018-08-02 NOTE — Progress Notes (Signed)
Patient ID: Matthew Molina, male   DOB: 18-Mar-1951, 67 y.o.   MRN: [id number] Complaint:  Visit Type: Patient returns to my office for  preventative foot care services. Complaint: Patient states" my nails have grown long and thick and become painful to walk and wear shoes" Patient has been diagnosed with DM with no foot complications. The patient presents for preventative foot care services. No changes to ROS He relates two painful callus under both feet.    Podiatric Exam: Vascular: dorsalis pedis and posterior tibial pulses are palpable bilateral. Capillary return is immediate. Temperature gradient is WNL. Skin turgor WNL  Sensorium: Normal Semmes Weinstein monofilament test. Normal tactile sensation bilaterally. Nail Exam: Pt has thick disfigured discolored nails with subungual debris noted bilateral entire nail hallux through fifth toenails Ulcer Exam: There is no evidence of ulcer or pre-ulcerative changes or infection. Orthopedic Exam: Muscle tone and strength are WNL. No limitations in general ROM. No crepitus or effusions noted. Foot type and digits show no abnormalities. Bony prominences are unremarkable.Mild brownish discoloration 1st MPJ right foot. Skin:  Porokeratosis sub 5th metabase both feet.. No infection or ulcers  Diagnosis:  Onychomycosis, , Pain in right toe, pain in left toes, Porokeratosis  Treatment & Plan Procedures and Treatment: Consent by patient was obtained for treatment procedures. The patient understood the discussion of treatment and procedures well. All questions were answered thoroughly reviewed. Debridement of mycotic and hypertrophic toenails, 1 through 5 bilateral and clearing of subungual debris. No ulceration, no infection noted. Debride porokeratosis. Return Visit-Office Procedure: Patient instructed to return to the office for a follow up visit 10 weeks  for continued evaluation and treatment.    Gardiner Barefoot DPM

## 2018-08-06 ENCOUNTER — Telehealth: Payer: Self-pay | Admitting: Family Medicine

## 2018-08-06 ENCOUNTER — Ambulatory Visit (INDEPENDENT_AMBULATORY_CARE_PROVIDER_SITE_OTHER): Payer: Self-pay | Admitting: Family Medicine

## 2018-08-06 DIAGNOSIS — Z09 Encounter for follow-up examination after completed treatment for conditions other than malignant neoplasm: Secondary | ICD-10-CM

## 2018-08-06 NOTE — Telephone Encounter (Signed)
New Message  Pt walked out of office from his appt. Brani went outside to if pt was there and he was MIA.   Per [name] I called pt to R/S because she has to care for other patients and pt picked up phone and hung up.  I did not make an attempt to call back. Appt is cancelled.

## 2018-08-09 ENCOUNTER — Other Ambulatory Visit: Payer: Self-pay | Admitting: Family Medicine

## 2018-08-10 NOTE — Progress Notes (Signed)
Pt left the office without  Being evaluated, staff was unaware that he was leaving without planning to return

## 2018-08-13 ENCOUNTER — Telehealth: Payer: Self-pay | Admitting: Family Medicine

## 2018-08-13 ENCOUNTER — Other Ambulatory Visit: Payer: Self-pay | Admitting: Family Medicine

## 2018-08-13 NOTE — Telephone Encounter (Signed)
Patient is requesting refill on Predrison and Spriva.  Please call in.

## 2018-08-14 ENCOUNTER — Other Ambulatory Visit: Payer: Self-pay

## 2018-08-14 MED ORDER — TIOTROPIUM BROMIDE MONOHYDRATE 18 MCG IN CAPS: 18.0000 ug | ORAL_CAPSULE | Freq: Every day | RESPIRATORY_TRACT | 12 refills | Status: DC

## 2018-08-14 NOTE — Telephone Encounter (Signed)
spiriva sent in. If he is needing more prednisone still he may need an appt.

## 2018-08-16 ENCOUNTER — Other Ambulatory Visit: Payer: Self-pay | Admitting: Family Medicine

## 2018-08-23 ENCOUNTER — Other Ambulatory Visit: Payer: Self-pay | Admitting: Family Medicine

## 2018-08-28 ENCOUNTER — Other Ambulatory Visit: Payer: Self-pay | Admitting: Family Medicine

## 2018-09-01 DIAGNOSIS — J449 Chronic obstructive pulmonary disease, unspecified: Secondary | ICD-10-CM | POA: Diagnosis not present

## 2018-09-04 ENCOUNTER — Other Ambulatory Visit: Payer: Self-pay | Admitting: Family Medicine

## 2018-09-12 ENCOUNTER — Telehealth: Payer: Self-pay | Admitting: Family Medicine

## 2018-09-12 NOTE — Telephone Encounter (Signed)
PT LVM for [place] to call him back.., Called and he couldn't hear me, but there was no static, or hiccup on the line, called back no answer

## 2018-09-16 ENCOUNTER — Ambulatory Visit: Payer: Self-pay | Admitting: Family Medicine

## 2018-09-18 ENCOUNTER — Other Ambulatory Visit: Payer: Self-pay

## 2018-09-18 ENCOUNTER — Encounter: Payer: Self-pay | Admitting: Family Medicine

## 2018-09-18 ENCOUNTER — Ambulatory Visit (INDEPENDENT_AMBULATORY_CARE_PROVIDER_SITE_OTHER): Payer: Medicare Other | Admitting: Family Medicine

## 2018-09-18 VITALS — BP 126/85 | HR 95 | Resp 12 | Ht 73.0 in | Wt 245.1 lb

## 2018-09-18 DIAGNOSIS — F17210 Nicotine dependence, cigarettes, uncomplicated: Secondary | ICD-10-CM

## 2018-09-18 DIAGNOSIS — E669 Obesity, unspecified: Secondary | ICD-10-CM

## 2018-09-18 DIAGNOSIS — E1169 Type 2 diabetes mellitus with other specified complication: Secondary | ICD-10-CM

## 2018-09-18 DIAGNOSIS — E785 Hyperlipidemia, unspecified: Secondary | ICD-10-CM

## 2018-09-18 DIAGNOSIS — Z23 Encounter for immunization: Secondary | ICD-10-CM | POA: Diagnosis not present

## 2018-09-18 DIAGNOSIS — Z720 Tobacco use: Secondary | ICD-10-CM

## 2018-09-18 DIAGNOSIS — I10 Essential (primary) hypertension: Secondary | ICD-10-CM | POA: Diagnosis not present

## 2018-09-18 MED ORDER — RISPERIDONE 1 MG PO TABS: ORAL_TABLET | ORAL | 4 refills | Status: DC

## 2018-09-18 MED ORDER — TIZANIDINE HCL 2 MG PO TABS: ORAL_TABLET | ORAL | 5 refills | Status: DC

## 2018-09-18 NOTE — Patient Instructions (Addendum)
Physical exam with mD dec 1 9 or after, call if you need me sooner  Please go and have labs TODAY  Flu vaccine today  Work on quitting smoking and staying quit as we discussed, now smoking 1 PPD   Steps to Quit Smoking Smoking tobacco can be bad for your health. It can also affect almost every organ in your body. Smoking puts you and people around you at risk for many serious long-lasting (chronic) diseases. Quitting smoking is hard, but it is one of the best things that you can do for your health. It is never too late to quit. What are the benefits of quitting smoking? When you quit smoking, you lower your risk for getting serious diseases and conditions. They can include:  Lung cancer or lung disease.  Heart disease.  Stroke.  Heart attack.  Not being able to have children (infertility).  Weak bones (osteoporosis) and broken bones (fractures).  If you have coughing, wheezing, and shortness of breath, those symptoms may get better when you quit. You may also get sick less often. If you are pregnant, quitting smoking can help to lower your chances of having a baby of low birth weight. What can I do to help me quit smoking? Talk with your doctor about what can help you quit smoking. Some things you can do (strategies) include:  Quitting smoking totally, instead of slowly cutting back how much you smoke over a period of time.  Going to in-person counseling. You are more likely to quit if you go to many counseling sessions.  Using resources and support systems, such as: ? Database administrator with a Social worker. ? Phone quitlines. ? Careers information officer. ? Support groups or group counseling. ? Text messaging programs. ? Mobile phone apps or applications.  Taking medicines. Some of these medicines may have nicotine in them. If you are pregnant or breastfeeding, do not take any medicines to quit smoking unless your doctor says it is okay. Talk with your doctor about counseling or  other things that can help you.  Talk with your doctor about using more than one strategy at the same time, such as taking medicines while you are also going to in-person counseling. This can help make quitting easier. What things can I do to make it easier to quit? Quitting smoking might feel very hard at first, but there is a lot that you can do to make it easier. Take these steps:  Talk to your family and friends. Ask them to support and encourage you.  Call phone quitlines, reach out to support groups, or work with a Social worker.  Ask people who smoke to not smoke around you.  Avoid places that make you want (trigger) to smoke, such as: ? Bars. ? Parties. ? Smoke-break areas at work.  Spend time with people who do not smoke.  Lower the stress in your life. Stress can make you want to smoke. Try these things to help your stress: ? Getting regular exercise. ? Deep-breathing exercises. ? Yoga. ? Meditating. ? Doing a body scan. To do this, close your eyes, focus on one area of your body at a time from head to toe, and notice which parts of your body are tense. Try to relax the muscles in those areas.  Download or buy apps on your mobile phone or tablet that can help you stick to your quit plan. There are many free apps, such as QuitGuide from the [employer] Office manager for Disease Control and Prevention). You  can find more support from smokefree.gov and other websites.  This information is not intended to replace advice given to you by your health care provider. Make sure you discuss any questions you have with your health care provider. Document Released: 09/30/2009 Document Revised: 08/01/2016 Document Reviewed: 04/20/2015 Elsevier Interactive Patient Education  2018 [employer].

## 2018-09-19 LAB — HEMOGLOBIN A1C
Hgb A1c MFr Bld: 7.5 % of total Hgb — ABNORMAL HIGH (ref <5.7)
Mean Plasma Glucose: 169 (calc)
eAG (mmol/L): 9.3 (calc)

## 2018-09-19 LAB — COMPLETE METABOLIC PANEL WITH GFR
AG Ratio: 1.4 (calc) (ref 1.0–2.5)
ALT: 23 U/L (ref 9–46)
AST: 21 U/L (ref 10–35)
Albumin: 4.2 g/dL (ref 3.6–5.1)
Alkaline phosphatase (APISO): 55 U/L (ref 40–115)
BUN/Creatinine Ratio: 9 (calc) (ref 6–22)
BUN: 13 mg/dL (ref 7–25)
CO2: 25 mmol/L (ref 20–32)
Calcium: 10.2 mg/dL (ref 8.6–10.3)
Chloride: 97 mmol/L — ABNORMAL LOW (ref 98–110)
Creat: 1.52 mg/dL — ABNORMAL HIGH (ref 0.70–1.25)
GFR, Est African American: 54 mL/min/{1.73_m2} — ABNORMAL LOW (ref >=60)
GFR, Est Non African American: 46 mL/min/{1.73_m2} — ABNORMAL LOW (ref >=60)
Globulin: 2.9 g/dL (calc) (ref 1.9–3.7)
Glucose, Bld: 156 mg/dL — ABNORMAL HIGH (ref 65–139)
Potassium: 3.6 mmol/L (ref 3.5–5.3)
Sodium: 131 mmol/L — ABNORMAL LOW (ref 135–146)
Total Bilirubin: 0.6 mg/dL (ref 0.2–1.2)
Total Protein: 7.1 g/dL (ref 6.1–8.1)

## 2018-09-19 LAB — LIPID PANEL
Cholesterol: 174 mg/dL (ref <200)
HDL: 38 mg/dL — ABNORMAL LOW (ref >40)
LDL Cholesterol (Calc): 99 mg/dL (calc)
Non-HDL Cholesterol (Calc): 136 mg/dL (calc) — ABNORMAL HIGH (ref <130)
Total CHOL/HDL Ratio: 4.6 (calc) (ref <5.0)
Triglycerides: 256 mg/dL — ABNORMAL HIGH (ref <150)

## 2018-09-21 ENCOUNTER — Encounter: Payer: Self-pay | Admitting: Family Medicine

## 2018-09-21 NOTE — Assessment & Plan Note (Signed)
Matthew Molina is reminded of the importance of commitment to daily physical activity for 30 minutes or more, as able and the need to limit carbohydrate intake to 30 to 60 grams per meal to help with blood sugar control.   The need to take medication as prescribed, test blood sugar as directed, and to call between visits if there is a concern that blood sugar is uncontrolled is also discussed.   Matthew Molina is reminded of the importance of daily foot exam, annual eye examination, and good blood sugar, blood pressure and cholesterol control. Controlled, no change in medication   Diabetic Labs Latest Ref Rng & Units 09/18/2018 07/24/2018 07/23/2018 07/21/2018 05/14/2018  HbA1c <5.7 % of total Hgb 7.5(H) - - - -  Microalbumin Not Estab. ug/mL - - - - 17.1(H)  Micro/Creat Ratio 0.0 - 30.0 mg/g creat - - - - 31.4(H)  Chol <200 mg/dL 174 - - - -  HDL >40 mg/dL 38(L) - - - -  Calc LDL mg/dL (calc) 99 - - - -  Triglycerides <150 mg/dL 256(H) - - - -  Creatinine 0.70 - 1.25 mg/dL 1.52(H) 1.32(H) 1.42(H) 1.31(H) -   BP/Weight 09/18/2018 07/24/2018 07/23/2018 07/21/2018 07/08/2018 05/14/2018 9/93/5701  Systolic BP 779 390 - 300 923 300 762  Diastolic BP 85 82 - 96 86 70 84  Wt. (Lbs) 245.08 - 240 240 246.04 243 244.12  BMI 32.33 - 31.66 31.66 32.46 32.06 32.21  Some encounter information is confidential and restricted. Go to Review Flowsheets activity to see all data.   Foot/eye exam completion dates Latest Ref Rng & Units 07/10/2016 08/30/2015  Eye Exam No Retinopathy - -  Foot Form Completion - Done Done

## 2018-09-21 NOTE — Assessment & Plan Note (Signed)
Controlled, no change in medication DASH diet and commitment to daily physical activity for a minimum of 30 minutes discussed and encouraged, as a part of hypertension management. The importance of attaining a healthy weight is also discussed.  BP/Weight 09/18/2018 07/24/2018 07/23/2018 07/21/2018 07/08/2018 05/14/2018 7/49/4496  Systolic BP 759 163 - 846 659 935 701  Diastolic BP 85 82 - 96 86 70 84  Wt. (Lbs) 245.08 - 240 240 246.04 243 244.12  BMI 32.33 - 31.66 31.66 32.46 32.06 32.21  Some encounter information is confidential and restricted. Go to Review Flowsheets activity to see all data.

## 2018-09-21 NOTE — Progress Notes (Signed)
Matthew Molina     MRN: [id number]      DOB: 21-Jun-1951   HPI Matthew Molina is here for follow up and re-evaluation of chronic medical conditions, medication management and review of any available recent lab and radiology data.  Preventive health is updated, specifically  Cancer screening and Immunization.   Questions or concerns regarding consultations or procedures which the PT has had in the interim are  addressed. The PT denies any adverse reactions to current medications since the last visit.  There are no new concerns.  There are no specific complaints  Denies polyuria, polydipsia, blurred vision , or hypoglycemic episodes.   ROS Denies recent fever or chills. Denies sinus pressure, nasal congestion, ear pain or sore throat. Denies chest congestion, productive cough or wheezing. Denies chest pains, palpitations and leg swelling Denies abdominal pain, nausea, vomiting,diarrhea or constipation.   Denies dysuria, frequency, hesitancy or incontinence. Denies joint pain, swelling and limitation in mobility. Denies headaches, seizures, numbness, or tingling. Denies uncontrolled depression, anxiety or insomnia. Denies skin break down or rash. Denies polyuria, polydipsia, blurred vision , or hypoglycemic episodes.    PE  BP 126/85 (BP Location: Left Arm, Patient Position: Sitting, Cuff Size: Large)   Pulse 95   Resp 12   Ht 6\' 1"  (1.854 m)   Wt 245 lb 1.3 oz (111.2 kg)   SpO2 96% Comment: room air  BMI 32.33 kg/m   Patient alert and oriented and in no cardiopulmonary distress.  HEENT: No facial asymmetry, EOMI,   oropharynx pink and moist.  Neck supple no JVD, no mass.  Chest: decreased air entry ,  Few scattered crackles and wheezes  CVS: S1, S2 no murmurs, no S3.Regular rate.  ABD: Soft non tender.   Ext: No edema  MS: decreased ROM spine, shoulders, hips and knees.  Skin: Intact, no ulcerations or rash noted.  Psych: Good eye contact, normal affect. Memory  intact not anxious or depressed appearing.  CNS: CN 2-12 intact, power,  normal throughout.no focal deficits noted.   Assessment & Plan  Diabetes mellitus type 2 in obese [facility]) Matthew Molina is reminded of the importance of commitment to daily physical activity for 30 minutes or more, as able and the need to limit carbohydrate intake to 30 to 60 grams per meal to help with blood sugar control.   The need to take medication as prescribed, test blood sugar as directed, and to call between visits if there is a concern that blood sugar is uncontrolled is also discussed.   Matthew Molina is reminded of the importance of daily foot exam, annual eye examination, and good blood sugar, blood pressure and cholesterol control. Controlled, no change in medication   Diabetic Labs Latest Ref Rng & Units 09/18/2018 07/24/2018 07/23/2018 07/21/2018 05/14/2018  HbA1c <5.7 % of total Hgb 7.5(H) - - - -  Microalbumin Not Estab. ug/mL - - - - 17.1(H)  Micro/Creat Ratio 0.0 - 30.0 mg/g creat - - - - 31.4(H)  Chol <200 mg/dL 174 - - - -  HDL >40 mg/dL 38(L) - - - -  Calc LDL mg/dL (calc) 99 - - - -  Triglycerides <150 mg/dL 256(H) - - - -  Creatinine 0.70 - 1.25 mg/dL 1.52(H) 1.32(H) 1.42(H) 1.31(H) -   BP/Weight 09/18/2018 07/24/2018 07/23/2018 07/21/2018 07/08/2018 05/14/2018 3/61/4431  Systolic BP 540 086 - 761 950 932 671  Diastolic BP 85 82 - 96 86 70 84  Wt. (Lbs) 245.08 - 240 240 246.04  243 244.12  BMI 32.33 - 31.66 31.66 32.46 32.06 32.21  Some encounter information is confidential and restricted. Go to Review Flowsheets activity to see all data.   Foot/eye exam completion dates Latest Ref Rng & Units 07/10/2016 08/30/2015  Eye Exam No Retinopathy - -  Foot Form Completion - Done Done        Obesity (BMI 30.0-34.9) Deteriorated. Patient re-educated about  the importance of commitment to a  minimum of 150 minutes of exercise per week.  The importance of healthy food choices with portion control  discussed. Encouraged to start a food diary, count calories and to consider  joining a support group. Sample diet sheets offered. Goals set by the patient for the next several months.   Weight /BMI 09/18/2018 07/23/2018 07/21/2018  WEIGHT 245 lb 1.3 oz 240 lb 240 lb  HEIGHT 6\' 1"  6\' 1"  6\' 1"   BMI 32.33 kg/m2 31.66 kg/m2 31.66 kg/m2  Some encounter information is confidential and restricted. Go to Review Flowsheets activity to see all data.      Hypertension Controlled, no change in medication DASH diet and commitment to daily physical activity for a minimum of 30 minutes discussed and encouraged, as a part of hypertension management. The importance of attaining a healthy weight is also discussed.  BP/Weight 09/18/2018 07/24/2018 07/23/2018 07/21/2018 07/08/2018 05/14/2018 07/20/2121  Systolic BP 482 500 - 370 488 891 694  Diastolic BP 85 82 - 96 86 70 84  Wt. (Lbs) 245.08 - 240 240 246.04 243 244.12  BMI 32.33 - 31.66 31.66 32.46 32.06 32.21  Some encounter information is confidential and restricted. Go to Review Flowsheets activity to see all data.       Tobacco abuse Asked: confirms smoking 1PPD cigarettes Assess; wants to quit Advise: needs to quit as at increased risk of heart disease and stroke and already  HAS ABNORMAL LUNG NODULES ASSIST: COUNSELED FOR 5 MINS AND HE WILL TAKE MED  TO ASSIST WITH QUITTING ARRANGE: F/U IN 3 MONTHS

## 2018-09-21 NOTE — Assessment & Plan Note (Signed)
Deteriorated. Patient re-educated about  the importance of commitment to a  minimum of 150 minutes of exercise per week.  The importance of healthy food choices with portion control discussed. Encouraged to start a food diary, count calories and to consider  joining a support group. Sample diet sheets offered. Goals set by the patient for the next several months.   Weight /BMI 09/18/2018 07/23/2018 07/21/2018  WEIGHT 245 lb 1.3 oz 240 lb 240 lb  HEIGHT 6\' 1"  6\' 1"  6\' 1"   BMI 32.33 kg/m2 31.66 kg/m2 31.66 kg/m2  Some encounter information is confidential and restricted. Go to Review Flowsheets activity to see all data.

## 2018-09-21 NOTE — Assessment & Plan Note (Signed)
Asked: confirms smoking 1PPD cigarettes Assess; wants to quit Advise: needs to quit as at increased risk of heart disease and stroke and already  HAS ABNORMAL LUNG NODULES ASSIST: COUNSELED FOR 5 MINS AND HE WILL TAKE MED  TO ASSIST WITH QUITTING ARRANGE: F/U IN 3 MONTHS

## 2018-10-01 DIAGNOSIS — J449 Chronic obstructive pulmonary disease, unspecified: Secondary | ICD-10-CM | POA: Diagnosis not present

## 2018-10-11 ENCOUNTER — Ambulatory Visit (INDEPENDENT_AMBULATORY_CARE_PROVIDER_SITE_OTHER): Payer: Medicare Other | Admitting: Podiatry

## 2018-10-11 ENCOUNTER — Encounter: Payer: Self-pay | Admitting: Podiatry

## 2018-10-11 DIAGNOSIS — M79676 Pain in unspecified toe(s): Secondary | ICD-10-CM

## 2018-10-11 DIAGNOSIS — E1140 Type 2 diabetes mellitus with diabetic neuropathy, unspecified: Secondary | ICD-10-CM

## 2018-10-11 DIAGNOSIS — E1149 Type 2 diabetes mellitus with other diabetic neurological complication: Secondary | ICD-10-CM | POA: Diagnosis not present

## 2018-10-11 DIAGNOSIS — B351 Tinea unguium: Secondary | ICD-10-CM

## 2018-10-11 DIAGNOSIS — Q828 Other specified congenital malformations of skin: Secondary | ICD-10-CM

## 2018-10-11 NOTE — Progress Notes (Signed)
Patient ID: Matthew Molina, male   DOB: 09-17-1951, 67 y.o.   MRN: [id number] Complaint:  Visit Type: Patient returns to my office for  preventative foot care services. Complaint: Patient states" my nails have grown long and thick and become painful to walk and wear shoes" Patient has been diagnosed with DM with no foot complications. The patient presents for preventative foot care services. No changes to ROS He relates two painful callus under both feet.    Podiatric Exam: Vascular: dorsalis pedis and posterior tibial pulses are palpable bilateral. Capillary return is immediate. Temperature gradient is WNL. Skin turgor WNL  Sensorium: Normal Semmes Weinstein monofilament test. Normal tactile sensation bilaterally. Nail Exam: Pt has thick disfigured discolored nails with subungual debris noted bilateral entire nail hallux through fifth toenails Ulcer Exam: There is no evidence of ulcer or pre-ulcerative changes or infection. Orthopedic Exam: Muscle tone and strength are WNL. No limitations in general ROM. No crepitus or effusions noted. Foot type and digits show no abnormalities. Bony prominences are unremarkable.Mild brownish discoloration 1st MPJ right foot. Skin:  Porokeratosis sub 5th metabase both feet.. No infection or ulcers  Diagnosis:  Onychomycosis, , Pain in right toe, pain in left toes, Porokeratosis  Treatment & Plan Procedures and Treatment: Consent by patient was obtained for treatment procedures. The patient understood the discussion of treatment and procedures well. All questions were answered thoroughly reviewed. Debridement of mycotic and hypertrophic toenails, 1 through 5 bilateral and clearing of subungual debris. No ulceration, no infection noted. Debride porokeratosis. Return Visit-Office Procedure: Patient instructed to return to the office for a follow up visit 10 weeks  for continued evaluation and treatment.    Gardiner Barefoot DPM

## 2018-10-15 DIAGNOSIS — Z961 Presence of intraocular lens: Secondary | ICD-10-CM | POA: Diagnosis not present

## 2018-10-15 DIAGNOSIS — H26493 Other secondary cataract, bilateral: Secondary | ICD-10-CM | POA: Diagnosis not present

## 2018-10-15 LAB — HM DIABETES EYE EXAM

## 2018-10-16 DIAGNOSIS — H5213 Myopia, bilateral: Secondary | ICD-10-CM | POA: Diagnosis not present

## 2018-10-17 ENCOUNTER — Other Ambulatory Visit: Payer: Self-pay | Admitting: Family Medicine

## 2018-10-28 ENCOUNTER — Other Ambulatory Visit: Payer: Self-pay

## 2018-10-28 MED ORDER — TRIAMTERENE-HCTZ 37.5-25 MG PO TABS: ORAL_TABLET | ORAL | 3 refills | Status: DC

## 2018-11-01 DIAGNOSIS — J449 Chronic obstructive pulmonary disease, unspecified: Secondary | ICD-10-CM | POA: Diagnosis not present

## 2018-11-04 ENCOUNTER — Other Ambulatory Visit: Payer: Self-pay | Admitting: Family Medicine

## 2018-11-18 ENCOUNTER — Other Ambulatory Visit: Payer: Self-pay | Admitting: Family Medicine

## 2018-11-22 ENCOUNTER — Other Ambulatory Visit: Payer: Self-pay | Admitting: Family Medicine

## 2018-11-27 ENCOUNTER — Other Ambulatory Visit: Payer: Self-pay

## 2018-11-27 ENCOUNTER — Telehealth: Payer: Self-pay | Admitting: *Deleted

## 2018-11-27 DIAGNOSIS — E1169 Type 2 diabetes mellitus with other specified complication: Secondary | ICD-10-CM

## 2018-11-27 DIAGNOSIS — E669 Obesity, unspecified: Secondary | ICD-10-CM

## 2018-11-27 MED ORDER — GLUCOSE BLOOD VI STRP: ORAL_STRIP | 1 refills | Status: DC

## 2018-11-27 NOTE — Telephone Encounter (Signed)
Pt was told by [facility] in [place] to call and request a refill on Accucheck Aviva + test strips for his CBG machine.

## 2018-11-27 NOTE — Progress Notes (Signed)
Test strips reordered 

## 2018-12-01 DIAGNOSIS — J449 Chronic obstructive pulmonary disease, unspecified: Secondary | ICD-10-CM | POA: Diagnosis not present

## 2018-12-03 ENCOUNTER — Other Ambulatory Visit: Payer: Self-pay | Admitting: Family Medicine

## 2018-12-16 ENCOUNTER — Other Ambulatory Visit: Payer: Self-pay | Admitting: Family Medicine

## 2018-12-17 ENCOUNTER — Encounter: Payer: Self-pay | Admitting: Family Medicine

## 2018-12-17 ENCOUNTER — Ambulatory Visit (INDEPENDENT_AMBULATORY_CARE_PROVIDER_SITE_OTHER): Payer: Medicare Other | Admitting: Family Medicine

## 2018-12-17 VITALS — BP 122/74 | HR 98 | Resp 15 | Ht 73.0 in | Wt 251.0 lb

## 2018-12-17 DIAGNOSIS — F17210 Nicotine dependence, cigarettes, uncomplicated: Secondary | ICD-10-CM | POA: Diagnosis not present

## 2018-12-17 DIAGNOSIS — E785 Hyperlipidemia, unspecified: Secondary | ICD-10-CM

## 2018-12-17 DIAGNOSIS — Z720 Tobacco use: Secondary | ICD-10-CM

## 2018-12-17 DIAGNOSIS — E669 Obesity, unspecified: Secondary | ICD-10-CM

## 2018-12-17 DIAGNOSIS — E1169 Type 2 diabetes mellitus with other specified complication: Secondary | ICD-10-CM

## 2018-12-17 DIAGNOSIS — Z0000 Encounter for general adult medical examination without abnormal findings: Secondary | ICD-10-CM | POA: Diagnosis not present

## 2018-12-17 DIAGNOSIS — I10 Essential (primary) hypertension: Secondary | ICD-10-CM

## 2018-12-17 DIAGNOSIS — F209 Schizophrenia, unspecified: Secondary | ICD-10-CM

## 2018-12-17 MED ORDER — TRAZODONE HCL 50 MG PO TABS: 50.0000 mg | ORAL_TABLET | Freq: Every evening | ORAL | 1 refills | Status: DC | PRN

## 2018-12-17 MED ORDER — RISPERIDONE 1 MG PO TABS: ORAL_TABLET | ORAL | 1 refills | Status: DC

## 2018-12-17 MED ORDER — ALBUTEROL SULFATE HFA 108 (90 BASE) MCG/ACT IN AERS: INHALATION_SPRAY | RESPIRATORY_TRACT | 5 refills | Status: DC

## 2018-12-17 MED ORDER — PRAVASTATIN SODIUM 20 MG PO TABS: ORAL_TABLET | ORAL | 1 refills | Status: DC

## 2018-12-17 MED ORDER — GLIPIZIDE ER 5 MG PO TB24: 5.0000 mg | ORAL_TABLET | Freq: Every day | ORAL | 1 refills | Status: DC

## 2018-12-17 NOTE — Patient Instructions (Signed)
F/U mid to end  March , call if you need me sooner  HBA1c, fasting lipid, cmp and EGFR 1 week before visit  Please cut back soking, need to and reciommit to quitting, also keep a close check on anxiety  It is important that you exercise regularly at least 30 minutes 5 times a week. If you develop chest pain, have severe difficulty breathing, or feel very tired, stop exercising immediately and seek medical attention

## 2018-12-17 NOTE — Progress Notes (Signed)
   Matthew Molina     MRN: [id number]      DOB: 12-25-1950   HPI: Patient is in for annual physical exam.  Recent labs,  are reviewed. Immunization is reviewed , and  updated if needed.    PE; BP 122/74   Pulse 98   Resp 15   Ht 6\' 1"  (1.854 m)   Wt 251 lb (113.9 kg)   SpO2 96%   BMI 33.12 kg/m   Pleasant male, alert and oriented x 3, in no cardio-pulmonary distress. Afebrile. HEENT No facial trauma or asymetry. Sinuses non tender. EOMI, pupils equally reactive to light. External ears normal, tympanic membranes clear. Oropharynx moist, no exudate. Neck: supple, no adenopathy,JVD or thyromegaly.No bruits.  Chest: Decreased air entry throughout Clear to ascultation bilaterally.No crackles or wheezes. Non tender to palpation    Cardiovascular system; Heart sounds normal,  S1 and  S2 ,no S3.  No murmur, or thrill. Apical beat not displaced Peripheral pulses normal.  Abdomen: Soft, non tender, no organomegaly or masses. No bruits. Bowel sounds normal. No guarding, tenderness or rebound.    Musculoskeletal exam: Decreased ROM of spine, hips , shoulders and knees.  deformity ,swelling and  crepitus noted.  muscle wasting noted .   Neurologic: Cranial nerves 2 to 12 intact. Power, tone , and reflexes normal throughout.  disturbance in gait. No tremor.  Skin: Intact, no ulceration, erythema , scaling or rash noted. Pigmentation normal throughout  Psych; Normal mood and affect. Judgement and concentration normal   Assessment & Plan:  Annual physical exam Annual exam as documented. Counseling done  re healthy lifestyle involving commitment to 150 minutes exercise per week, heart healthy diet, and attaining healthy weight.The importance of adequate sleep also discussed. Changes in health habits are decided on by the patient with goals and time frames  set for achieving them. Immunization and cancer screening needs are specifically addressed at this  visit.   Tobacco abuse Asked:confirms currently smokes cigarettes approx 1PPD Assess: Unwilling to quit but cutting back Advise: needs to QUIT to reduce risk of cancer, cardio and cerebrovascular disease, and lung failure Assist: counseled for 5 minutes and literature provided Arrange: follow up in 3 months   Obesity (BMI 30.0-34.9) Deteriorated. Patient re-educated about  the importance of commitment to a  minimum of 150 minutes of exercise per week.  The importance of healthy food choices with portion control discussed. Encouraged to start a food diary, count calories and to consider  joining a support group. Sample diet sheets offered. Goals set by the patient for the next several months.   Weight /BMI 12/17/2018 09/18/2018 07/23/2018  WEIGHT 251 lb 245 lb 1.3 oz 240 lb  HEIGHT 6\' 1"  6\' 1"  6\' 1"   BMI 33.12 kg/m2 32.33 kg/m2 31.66 kg/m2  Some encounter information is confidential and restricted. Go to Review Flowsheets activity to see all data.      Schizophrenia, unspecified type ([place]) Reports increase in non threatening auditory hallucinations in the past 6 months approximately, denies  visual hallucinations, suicidal or homicidal ideation. Hass 1 friend and confidant who is aware of this and ishis support Refuses Psych re assessment for med management at this time, and contracts to be diligent as far as mental health assessment and need to seek therapy

## 2018-12-20 ENCOUNTER — Encounter: Payer: Self-pay | Admitting: Family Medicine

## 2018-12-20 NOTE — Assessment & Plan Note (Signed)
Annual exam as documented. Counseling done  re healthy lifestyle involving commitment to 150 minutes exercise per week, heart healthy diet, and attaining healthy weight.The importance of adequate sleep also discussed. Changes in health habits are decided on by the patient with goals and time frames  set for achieving them. Immunization and cancer screening needs are specifically addressed at this visit. 

## 2018-12-20 NOTE — Assessment & Plan Note (Signed)
Reports increase in non threatening auditory hallucinations in the past 6 months approximately, denies  visual hallucinations, suicidal or homicidal ideation. Hass 1 friend and confidant who is aware of this and ishis support Refuses Psych re assessment for med management at this time, and contracts to be diligent as far as mental health assessment and need to seek therapy

## 2018-12-20 NOTE — Assessment & Plan Note (Signed)
Deteriorated. Patient re-educated about  the importance of commitment to a  minimum of 150 minutes of exercise per week.  The importance of healthy food choices with portion control discussed. Encouraged to start a food diary, count calories and to consider  joining a support group. Sample diet sheets offered. Goals set by the patient for the next several months.   Weight /BMI 12/17/2018 09/18/2018 07/23/2018  WEIGHT 251 lb 245 lb 1.3 oz 240 lb  HEIGHT 6\' 1"  6\' 1"  6\' 1"   BMI 33.12 kg/m2 32.33 kg/m2 31.66 kg/m2  Some encounter information is confidential and restricted. Go to Review Flowsheets activity to see all data.

## 2018-12-20 NOTE — Assessment & Plan Note (Signed)
Asked:confirms currently smokes cigarettes approx 1PPD Assess: Unwilling to quit but cutting back Advise: needs to QUIT to reduce risk of cancer, cardio and cerebrovascular disease, and lung failure Assist: counseled for 5 minutes and literature provided Arrange: follow up in 3 months

## 2018-12-25 ENCOUNTER — Other Ambulatory Visit: Payer: Self-pay | Admitting: Family Medicine

## 2019-01-01 ENCOUNTER — Encounter: Payer: Self-pay | Admitting: Podiatry

## 2019-01-01 ENCOUNTER — Ambulatory Visit (INDEPENDENT_AMBULATORY_CARE_PROVIDER_SITE_OTHER): Payer: Medicare Other | Admitting: Podiatry

## 2019-01-01 DIAGNOSIS — E1149 Type 2 diabetes mellitus with other diabetic neurological complication: Secondary | ICD-10-CM

## 2019-01-01 DIAGNOSIS — B351 Tinea unguium: Secondary | ICD-10-CM

## 2019-01-01 DIAGNOSIS — Q828 Other specified congenital malformations of skin: Secondary | ICD-10-CM | POA: Diagnosis not present

## 2019-01-01 DIAGNOSIS — M79676 Pain in unspecified toe(s): Secondary | ICD-10-CM

## 2019-01-01 DIAGNOSIS — M2142 Flat foot [pes planus] (acquired), left foot: Secondary | ICD-10-CM

## 2019-01-01 DIAGNOSIS — J449 Chronic obstructive pulmonary disease, unspecified: Secondary | ICD-10-CM | POA: Diagnosis not present

## 2019-01-01 DIAGNOSIS — E1140 Type 2 diabetes mellitus with diabetic neuropathy, unspecified: Secondary | ICD-10-CM

## 2019-01-01 DIAGNOSIS — M2141 Flat foot [pes planus] (acquired), right foot: Secondary | ICD-10-CM

## 2019-01-01 NOTE — Progress Notes (Signed)
Patient ID: Matthew Molina, male   DOB: 10-05-1951, 68 y.o.   MRN: [id number] Complaint:  Visit Type: Patient returns to my office for  preventative foot care services. Complaint: Patient states" my nails have grown long and thick and become painful to walk and wear shoes" Patient has been diagnosed with DM with no foot complications. The patient presents for preventative foot care services. No changes to ROS He relates two painful callus under both feet.    Podiatric Exam: Vascular: dorsalis pedis and posterior tibial pulses are palpable bilateral. Capillary return is immediate. Temperature gradient is WNL. Skin turgor WNL  Sensorium: Normal Semmes Weinstein monofilament test. Normal tactile sensation bilaterally. Nail Exam: Pt has thick disfigured discolored nails with subungual debris noted bilateral entire nail hallux through fifth toenails Ulcer Exam: There is no evidence of ulcer or pre-ulcerative changes or infection. Orthopedic Exam: Muscle tone and strength are WNL. No limitations in general ROM. No crepitus or effusions noted. Foot type and digits show no abnormalities. Bony prominences are unremarkable.Mild brownish discoloration 1st MPJ right foot. Skin:  Porokeratosis sub 5th metabase both feet.. No infection or ulcers  Diagnosis:  Onychomycosis, , Pain in right toe, pain in left toes, Porokeratosis  Treatment & Plan Procedures and Treatment: Consent by patient was obtained for treatment procedures. The patient understood the discussion of treatment and procedures well. All questions were answered thoroughly reviewed. Debridement of mycotic and hypertrophic toenails, 1 through 5 bilateral and clearing of subungual debris. No ulceration, no infection noted. Debride porokeratosis. ABN signed 2020. Return Visit-Office Procedure: Patient instructed to return to the office for a follow up visit 10 weeks  for continued evaluation and treatment.    Gardiner Barefoot DPM

## 2019-01-15 ENCOUNTER — Telehealth: Payer: Self-pay | Admitting: Family Medicine

## 2019-01-15 NOTE — Telephone Encounter (Signed)
Pt is calling states he thinks he has a boil on his back, but not for sure, it hurts, and he is in a lot of pain, he does not have a way up here, and its to late for [facility]. I advised that he needs to be seen for treatment, and he wanted to speak with a nurse.

## 2019-01-15 NOTE — Telephone Encounter (Signed)
Spoke with patient and explained to him that we are unable to treat him blindly that we needed to see him before prescribing him medication. He stated that he had no way to get here tomorrow and he needs the medicine now because he is in pain. I told him I would send a message to [place] however she wouldn't get the message until after we were gone for the day. He verbalized understanding.

## 2019-01-16 ENCOUNTER — Telehealth: Payer: Self-pay | Admitting: Family Medicine

## 2019-01-16 NOTE — Telephone Encounter (Signed)
Called the pt to offer an appointment today with Hannah-NP. He said today was not a good day, I advised that Dr Moshe Cipro would like him to come in to be seen. I also advised that we would be available for an appt anytime before 4pm. today

## 2019-01-16 NOTE — Telephone Encounter (Signed)
I personally called and spoke with pt and caregiver at lunchtime, caregiver fe;lt pt would be able to get transport from family member to come in to office this pm, and she said that the boil had  gotten larger. When front desk called to confirm an afternoon appt , pt again stated he is unable top come in, she advised him if worsened to go to the ED and an appt for next week Monday to be arranged

## 2019-01-19 ENCOUNTER — Other Ambulatory Visit: Payer: Self-pay

## 2019-01-19 ENCOUNTER — Emergency Department (HOSPITAL_COMMUNITY)
Admission: EM | Admit: 2019-01-19 | Discharge: 2019-01-19 | Disposition: A | Discharge status: Home / Self Care | Payer: Medicare Other | Attending: Emergency Medicine | Admitting: Emergency Medicine

## 2019-01-19 ENCOUNTER — Encounter (HOSPITAL_COMMUNITY): Payer: Self-pay | Admitting: *Deleted

## 2019-01-19 DIAGNOSIS — Z79899 Other long term (current) drug therapy: Secondary | ICD-10-CM | POA: Diagnosis not present

## 2019-01-19 DIAGNOSIS — F17210 Nicotine dependence, cigarettes, uncomplicated: Secondary | ICD-10-CM | POA: Insufficient documentation

## 2019-01-19 DIAGNOSIS — I2510 Atherosclerotic heart disease of native coronary artery without angina pectoris: Secondary | ICD-10-CM | POA: Insufficient documentation

## 2019-01-19 DIAGNOSIS — L02212 Cutaneous abscess of back [any part, except buttock]: Secondary | ICD-10-CM | POA: Diagnosis not present

## 2019-01-19 DIAGNOSIS — E1122 Type 2 diabetes mellitus with diabetic chronic kidney disease: Secondary | ICD-10-CM | POA: Diagnosis not present

## 2019-01-19 DIAGNOSIS — N183 Chronic kidney disease, stage 3 (moderate): Secondary | ICD-10-CM | POA: Insufficient documentation

## 2019-01-19 DIAGNOSIS — I129 Hypertensive chronic kidney disease with stage 1 through stage 4 chronic kidney disease, or unspecified chronic kidney disease: Secondary | ICD-10-CM | POA: Insufficient documentation

## 2019-01-19 DIAGNOSIS — J449 Chronic obstructive pulmonary disease, unspecified: Secondary | ICD-10-CM | POA: Insufficient documentation

## 2019-01-19 DIAGNOSIS — L0291 Cutaneous abscess, unspecified: Secondary | ICD-10-CM

## 2019-01-19 DIAGNOSIS — Z7984 Long term (current) use of oral hypoglycemic drugs: Secondary | ICD-10-CM | POA: Insufficient documentation

## 2019-01-19 DIAGNOSIS — Z7982 Long term (current) use of aspirin: Secondary | ICD-10-CM | POA: Diagnosis not present

## 2019-01-19 DIAGNOSIS — Z9981 Dependence on supplemental oxygen: Secondary | ICD-10-CM | POA: Diagnosis not present

## 2019-01-19 DIAGNOSIS — M546 Pain in thoracic spine: Secondary | ICD-10-CM | POA: Diagnosis present

## 2019-01-19 LAB — CBG MONITORING, ED: Glucose-Capillary: 192 mg/dL — ABNORMAL HIGH (ref 70–99)

## 2019-01-19 MED ORDER — LIDOCAINE HCL (PF) 2 % IJ SOLN
INTRAMUSCULAR | Status: AC
  Administered 2019-01-19: 07:00:00
  Filled 2019-01-19: qty 20

## 2019-01-19 MED ORDER — CLINDAMYCIN HCL 150 MG PO CAPS: 450.0000 mg | ORAL_CAPSULE | Freq: Four times a day (QID) | ORAL | 0 refills | Status: AC

## 2019-01-19 MED ORDER — CLINDAMYCIN HCL 150 MG PO CAPS
300.0000 mg | ORAL_CAPSULE | Freq: Once | ORAL | Status: AC
  Administered 2019-01-19: 300 mg via ORAL
  Filled 2019-01-19: qty 2

## 2019-01-19 NOTE — ED Triage Notes (Signed)
Pt c/o abscess to left upper back area that started 3 days ago with small amount of yellow drainage,

## 2019-01-19 NOTE — Discharge Instructions (Addendum)
We saw in the ER for the back pain. We drained as much as plus is possible in the ER.  It appears to [place] that there is a complex wound and there could be more abscess developing.  It is rather better for you to be seen by general surgeon.  Please call Dr. Arnoldo Morale on Monday for an appointment to be seen on Tuesday afternoon.  Return to the ER immediately if you start having worsening of your symptoms, fevers, chills, confusion or if your sugar starts going over 500.

## 2019-01-20 ENCOUNTER — Telehealth: Payer: Self-pay | Admitting: Family Medicine

## 2019-01-20 ENCOUNTER — Ambulatory Visit (INDEPENDENT_AMBULATORY_CARE_PROVIDER_SITE_OTHER): Payer: Medicare Other | Admitting: Family Medicine

## 2019-01-20 ENCOUNTER — Encounter: Payer: Self-pay | Admitting: Family Medicine

## 2019-01-20 VITALS — BP 120/54 | HR 101 | Resp 12 | Ht 73.0 in | Wt 244.0 lb

## 2019-01-20 DIAGNOSIS — E669 Obesity, unspecified: Secondary | ICD-10-CM

## 2019-01-20 DIAGNOSIS — F17210 Nicotine dependence, cigarettes, uncomplicated: Secondary | ICD-10-CM | POA: Diagnosis not present

## 2019-01-20 DIAGNOSIS — Z720 Tobacco use: Secondary | ICD-10-CM

## 2019-01-20 DIAGNOSIS — L0292 Furuncle, unspecified: Secondary | ICD-10-CM | POA: Diagnosis not present

## 2019-01-20 DIAGNOSIS — I10 Essential (primary) hypertension: Secondary | ICD-10-CM

## 2019-01-20 DIAGNOSIS — E1169 Type 2 diabetes mellitus with other specified complication: Secondary | ICD-10-CM

## 2019-01-20 MED ORDER — CEFTRIAXONE SODIUM 500 MG IJ SOLR
500.0000 mg | Freq: Once | INTRAMUSCULAR | Status: AC
  Administered 2019-01-20: 500 mg via INTRAMUSCULAR

## 2019-01-20 MED ORDER — DOXYCYCLINE HYCLATE 100 MG PO TABS: 100.0000 mg | ORAL_TABLET | Freq: Two times a day (BID) | ORAL | 0 refills | Status: DC

## 2019-01-20 NOTE — Assessment & Plan Note (Signed)
I/D of abscess posterior left chest performed in ED 1 day ago and has appt with gen Surg in am. Area  Of cellulitis has decreased in size from original marking done in ED , but is still large C/s of abscess still pending , was sent in ED Rocephin 500 mg IM administered and doxycycline prescribed, also home health for cleaning and dressing changes two to three times weekly for next 3 weeks ordered an wound was cleaned with saline and dry gauze dressing applied in the office Surgery  Input greatly appreciated

## 2019-01-20 NOTE — Patient Instructions (Addendum)
F/u  As before call if you need me sooner  Rocephin 500 mg IM administered in office today for cellulitis of back  14 x 11 cm  10 day course of doxycycline is prescribed, you need to take the entire course.,  The wound needs to be cleaned with saline and dressed daily for the next 3 weeks, until; healed, referral to home health will be done  Labs today, cBC and diff, cmp and EGFR, hBA1C today  You need to quit smoking , this will help[ your infection top heal

## 2019-01-20 NOTE — Assessment & Plan Note (Signed)
likely currently uncontrolled due to infection, updated lab today Matthew Molina is reminded of the importance of commitment to daily physical activity for 30 minutes or more, as able and the need to limit carbohydrate intake to 30 to 60 grams per meal to help with blood sugar control.   The need to take medication as prescribed, test blood sugar as directed, and to call between visits if there is a concern that blood sugar is uncontrolled is also discussed.   Matthew Molina is reminded of the importance of daily foot exam, annual eye examination, and good blood sugar, blood pressure and cholesterol control.  Diabetic Labs Latest Ref Rng & Units 09/18/2018 07/24/2018 07/23/2018 07/21/2018 05/14/2018  HbA1c <5.7 % of total Hgb 7.5(H) - - - -  Microalbumin Not Estab. ug/mL - - - - 17.1(H)  Micro/Creat Ratio 0.0 - 30.0 mg/g creat - - - - 31.4(H)  Chol <200 mg/dL 174 - - - -  HDL >40 mg/dL 38(L) - - - -  Calc LDL mg/dL (calc) 99 - - - -  Triglycerides <150 mg/dL 256(H) - - - -  Creatinine 0.70 - 1.25 mg/dL 1.52(H) 1.32(H) 1.42(H) 1.31(H) -   BP/Weight 01/20/2019 01/19/2019 12/17/2018 09/18/2018 07/24/2018 06/22/8831 04/20/9825  Systolic BP 415 830 940 768 088 - 110  Diastolic BP 54 93 74 85 82 - 96  Wt. (Lbs) 244.04 240 251 245.08 - 240 240  BMI 32.2 31.66 33.12 32.33 - 31.66 31.66  Some encounter information is confidential and restricted. Go to Review Flowsheets activity to see all data.   Foot/eye exam completion dates Latest Ref Rng & Units 10/15/2018 07/10/2016  Eye Exam No Retinopathy No Retinopathy -  Foot Form Completion - - Done

## 2019-01-20 NOTE — Assessment & Plan Note (Signed)
Asked:confirms currently smokes cigarettes Assess: Unwilling to quit but cutting back Advise: needs to QUIT to reduce risk of cancer, cardio and cerebrovascular disease Assist: counseled for 5 minutes and literature provided Arrange: follow up in 3 months  

## 2019-01-20 NOTE — Progress Notes (Signed)
Matthew Molina     MRN: [id number]      DOB: 1951-12-14   HPI Matthew Molina is here for follow up of ED visit 1 day ago for boil on left upper back.Symptoms first started approx 5 days prior , and he deferred clinical evaluation despite the recommendation, , boil reportedly enlargd quickly, and caregiver reports poor appetite with symptom onset   ROS See HPI  Denies recent fever possible  Chills.also poor appetite and malaise  Denies sinus pressure, nasal congestion, ear pain or sore throat. Denies chest congestion, productive cough or wheezing. Denies chest pains, palpitations and leg swelling Denies abdominal pain, nausea, vomiting,diarrhea or constipation.   Denies dysuria, frequency, hesitancy or incontinence. Denies uncontrolled  joint pain, swelling and limitation in mobility. Denies headaches, seizures, numbness, or tingling. Denies depression, anxiety or insomnia. Denies skin break down or rash.   PE  BP (!) 120/54   Pulse (!) 101   Resp 12   Ht 6\' 1"  (1.854 m)   Wt 244 lb 0.6 oz (110.7 kg)   SpO2 96%   BMI 32.20 kg/m   Patient alert and oriented and in no cardiopulmonary distress.  HEENT: No facial asymmetry, EOMI,   oropharynx pink and moist.  Neck supple no JVD, no mass.  Chest: Clear to auscultation bilaterally.  CVS: S1, S2 no murmurs, no S3.Regular rate.  ABD: Soft non tender.   Ext: No edema  MS: Adequate ROM spine, shoulders, hips and knees.  Skin: cellulitis on posterior chest , diameter approx 11 x 14 cm, open wound where pt had lancing of boil done in the ED  Psych: Good eye contact, normal affect. Memory intact not anxious or depressed appearing.  CNS: CN 2-12 intact, power,  normal throughout.no focal deficits noted.   Assessment & Plan  Boil I/D of abscess posterior left chest performed in ED 1 day ago and has appt with gen Surg in am. Area  Of cellulitis has decreased in size from original marking done in ED , but is still large C/s of  abscess still pending , was sent in ED Rocephin 500 mg IM administered and doxycycline prescribed, also home health for cleaning and dressing changes two to three times weekly for next 3 weeks ordered an wound was cleaned with saline and dry gauze dressing applied in the office Surgery  Input greatly appreciated  Hypertension Controlled, no change in medication DASH diet and commitment to daily physical activity for a minimum of 30 minutes discussed and encouraged, as a part of hypertension management. The importance of attaining a healthy weight is also discussed.  BP/Weight 01/20/2019 01/19/2019 12/17/2018 09/18/2018 07/24/2018 6/0/6301 6/0/1093  Systolic BP 235 573 220 254 270 - 623  Diastolic BP 54 93 74 85 82 - 96  Wt. (Lbs) 244.04 240 251 245.08 - 240 240  BMI 32.2 31.66 33.12 32.33 - 31.66 31.66  Some encounter information is confidential and restricted. Go to Review Flowsheets activity to see all data.       Diabetes mellitus type 2 in obese [facility]) likely currently uncontrolled due to infection, updated lab today Matthew Molina is reminded of the importance of commitment to daily physical activity for 30 minutes or more, as able and the need to limit carbohydrate intake to 30 to 60 grams per meal to help with blood sugar control.   The need to take medication as prescribed, test blood sugar as directed, and to call between visits if there is a concern that  blood sugar is uncontrolled is also discussed.   Matthew Molina is reminded of the importance of daily foot exam, annual eye examination, and good blood sugar, blood pressure and cholesterol control.  Diabetic Labs Latest Ref Rng & Units 09/18/2018 07/24/2018 07/23/2018 07/21/2018 05/14/2018  HbA1c <5.7 % of total Hgb 7.5(H) - - - -  Microalbumin Not Estab. ug/mL - - - - 17.1(H)  Micro/Creat Ratio 0.0 - 30.0 mg/g creat - - - - 31.4(H)  Chol <200 mg/dL 174 - - - -  HDL >40 mg/dL 38(L) - - - -  Calc LDL mg/dL (calc) 99 - - - -  Triglycerides <150  mg/dL 256(H) - - - -  Creatinine 0.70 - 1.25 mg/dL 1.52(H) 1.32(H) 1.42(H) 1.31(H) -   BP/Weight 01/20/2019 01/19/2019 12/17/2018 09/18/2018 07/24/2018 02/18/75 01/20/6332  Systolic BP 545 625 638 937 342 - 876  Diastolic BP 54 93 74 85 82 - 96  Wt. (Lbs) 244.04 240 251 245.08 - 240 240  BMI 32.2 31.66 33.12 32.33 - 31.66 31.66  Some encounter information is confidential and restricted. Go to Review Flowsheets activity to see all data.   Foot/eye exam completion dates Latest Ref Rng & Units 10/15/2018 07/10/2016  Eye Exam No Retinopathy No Retinopathy -  Foot Form Completion - - Done        Tobacco abuse Asked:confirms currently smokes cigarettes Assess: Unwilling to quit but cutting back Advise: needs to QUIT to reduce risk of cancer, cardio and cerebrovascular disease Assist: counseled for 5 minutes and literature provided Arrange: follow up in 3 months

## 2019-01-20 NOTE — Assessment & Plan Note (Signed)
Controlled, no change in medication DASH diet and commitment to daily physical activity for a minimum of 30 minutes discussed and encouraged, as a part of hypertension management. The importance of attaining a healthy weight is also discussed.  BP/Weight 01/20/2019 01/19/2019 12/17/2018 09/18/2018 07/24/2018 01/23/7123 04/24/997  Systolic BP 338 250 539 767 341 - 937  Diastolic BP 54 93 74 85 82 - 96  Wt. (Lbs) 244.04 240 251 245.08 - 240 240  BMI 32.2 31.66 33.12 32.33 - 31.66 31.66  Some encounter information is confidential and restricted. Go to Review Flowsheets activity to see all data.

## 2019-01-21 ENCOUNTER — Encounter: Payer: Self-pay | Admitting: General Surgery

## 2019-01-21 ENCOUNTER — Ambulatory Visit (INDEPENDENT_AMBULATORY_CARE_PROVIDER_SITE_OTHER): Payer: Medicare Other | Admitting: General Surgery

## 2019-01-21 VITALS — BP 127/85 | HR 97 | Temp 98.7°F | Resp 20 | Wt 244.0 lb

## 2019-01-21 DIAGNOSIS — L723 Sebaceous cyst: Secondary | ICD-10-CM | POA: Diagnosis not present

## 2019-01-21 DIAGNOSIS — L089 Local infection of the skin and subcutaneous tissue, unspecified: Secondary | ICD-10-CM

## 2019-01-21 LAB — CBC WITH DIFFERENTIAL/PLATELET
Absolute Monocytes: 1255 cells/uL — ABNORMAL HIGH (ref 200–950)
Basophils Absolute: 56 cells/uL (ref 0–200)
Basophils Relative: 0.4 %
Eosinophils Absolute: 465 cells/uL (ref 15–500)
Eosinophils Relative: 3.3 %
HCT: 36.1 % — ABNORMAL LOW (ref 38.5–50.0)
Hemoglobin: 12.5 g/dL — ABNORMAL LOW (ref 13.2–17.1)
Lymphs Abs: 2242 cells/uL (ref 850–3900)
MCH: 29.1 pg (ref 27.0–33.0)
MCHC: 34.6 g/dL (ref 32.0–36.0)
MCV: 84.1 fL (ref 80.0–100.0)
MPV: 9.8 fL (ref 7.5–12.5)
Monocytes Relative: 8.9 %
Neutro Abs: 10082 cells/uL — ABNORMAL HIGH (ref 1500–7800)
Neutrophils Relative %: 71.5 %
Platelets: 341 10*3/uL (ref 140–400)
RBC: 4.29 10*6/uL (ref 4.20–5.80)
RDW: 13.1 % (ref 11.0–15.0)
Total Lymphocyte: 15.9 %
WBC: 14.1 10*3/uL — ABNORMAL HIGH (ref 3.8–10.8)

## 2019-01-21 LAB — AEROBIC CULTURE W GRAM STAIN (SUPERFICIAL SPECIMEN): Special Requests: NORMAL

## 2019-01-21 LAB — COMPLETE METABOLIC PANEL WITH GFR
AG Ratio: 1.3 (calc) (ref 1.0–2.5)
ALT: 31 U/L (ref 9–46)
AST: 24 U/L (ref 10–35)
Albumin: 3.9 g/dL (ref 3.6–5.1)
Alkaline phosphatase (APISO): 58 U/L (ref 35–144)
BUN/Creatinine Ratio: 10 (calc) (ref 6–22)
BUN: 15 mg/dL (ref 7–25)
CO2: 25 mmol/L (ref 20–32)
Calcium: 9.5 mg/dL (ref 8.6–10.3)
Chloride: 94 mmol/L — ABNORMAL LOW (ref 98–110)
Creat: 1.46 mg/dL — ABNORMAL HIGH (ref 0.70–1.25)
GFR, Est African American: 56 mL/min/{1.73_m2} — ABNORMAL LOW (ref >=60)
GFR, Est Non African American: 48 mL/min/{1.73_m2} — ABNORMAL LOW (ref >=60)
Globulin: 3.1 g/dL (calc) (ref 1.9–3.7)
Glucose, Bld: 113 mg/dL (ref 65–139)
Potassium: 3.6 mmol/L (ref 3.5–5.3)
Sodium: 128 mmol/L — ABNORMAL LOW (ref 135–146)
Total Bilirubin: 0.5 mg/dL (ref 0.2–1.2)
Total Protein: 7 g/dL (ref 6.1–8.1)

## 2019-01-21 LAB — HEMOGLOBIN A1C
Hgb A1c MFr Bld: 7.4 % of total Hgb — ABNORMAL HIGH (ref <5.7)
Mean Plasma Glucose: 166 (calc)
eAG (mmol/L): 9.2 (calc)

## 2019-01-22 ENCOUNTER — Telehealth: Payer: Self-pay

## 2019-01-22 NOTE — ED Provider Notes (Signed)
[facility] EMERGENCY DEPARTMENT Provider Note   CSN: [id number] Arrival date & time: 01/19/19  0457     History   Chief Complaint Chief Complaint  Patient presents with  . Abscess    HPI [name] is a 68 y.o. male.  HPI  68 year old male with history of diabetes comes in a chief complaint of left upper back pain.  Patient states that he started noticing swelling and pain in his back 3 days ago.  Over time he has had increased pain and he was unable to sleep tonight.  Patient denies any nausea, vomiting, fevers, chills he has no history of skin abscess.  Past Medical History:  Diagnosis Date  . Acute kidney injury ([place])   . Acute respiratory failure with hypoxia ([place])   . Arthritis   . COPD (chronic obstructive pulmonary disease) ([place])   . Depression   . Diabetes mellitus   . Diabetes mellitus without complication ([place])   . Hypercholesterolemia   . Hyperlipidemia   . Hypertension   . Multiple lung nodules on CT 04/02/2015  . Nicotine addiction   . Obesity   . Oxygen deficiency    qhs  . Schizophrenia [facility])     Patient Active Problem List   Diagnosis Date Noted  . Boil 01/20/2019  . Unsteady gait 04/18/2017  . Elevated PSA 10/01/2016  . External hemorrhoid, thrombosed 08/24/2016  . GERD (gastroesophageal reflux disease) 08/24/2016  . CKD (chronic kidney disease) stage 3, GFR 30-59 ml/min ([facility]) 05/22/2016  . Insomnia 01/05/2016  . COPD (chronic obstructive pulmonary disease) ([place]) 11/05/2015  . Hypertension 11/05/2015  . Tobacco abuse 07/24/2015  . Coronary atherosclerosis of native coronary artery 04/02/2015  . Multiple lung nodules on CT 04/02/2015  . GAD (generalized anxiety disorder) 07/04/2012  . Back pain with radiation 11/01/2011  . Diabetes mellitus type 2 in obese ([place]) 04/17/2011  . Hyperlipidemia 06/02/2008  . Obesity (BMI 30.0-34.9) 06/02/2008  . Schizophrenia, unspecified type ([place]) 06/02/2008    Past Surgical History:  Procedure  Laterality Date  . CATARACT EXTRACTION W/PHACO Left 11/20/2013   Procedure: CATARACT EXTRACTION PHACO AND INTRAOCULAR LENS PLACEMENT (IOC);  Surgeon: Tonny Branch, MD;  Location: [facility] ORS;  Service: Ophthalmology;  Laterality: Left;  CDE:10.26  . CATARACT EXTRACTION W/PHACO Right 12/08/2013   Procedure: RIGHT EYE CATARACT EXTRACTION PHACO AND INTRAOCULAR LENS PLACEMENT ;  Surgeon: Tonny Branch, MD;  Location: [facility] ORS;  Service: Ophthalmology;  Laterality: Right;  CDE 12.38  . COLONOSCOPY N/A 04/01/2013   Procedure: COLONOSCOPY;  Surgeon: Danie Binder, MD;  Location: [facility] ENDO SUITE;  Service: Endoscopy;  Laterality: N/A;  10:00 AM-moved to [place] notified pt  . COLONOSCOPY  2014   INCOMPLETE PREP IN R COLON  . ESOPHAGOGASTRODUODENOSCOPY N/A 02/18/2016   Procedure: ESOPHAGOGASTRODUODENOSCOPY (EGD);  Surgeon: Danie Binder, MD;  Location: [facility] ENDO SUITE;  Service: Endoscopy;  Laterality: N/A;  1145  . EYE SURGERY Left 11/2013   cataract extraction  . SAVORY DILATION N/A 02/18/2016   Procedure: SAVORY DILATION;  Surgeon: Danie Binder, MD;  Location: [facility] ENDO SUITE;  Service: Endoscopy;  Laterality: N/A;  . SHOULDER SURGERY          Home Medications    Prior to Admission medications   Medication Sig Start Date End Date Taking? Authorizing Provider  ACCU-CHEK SOFTCLIX LANCETS lancets TEST BLOOD SUGAR ONCE DAILY AS DIRECTED. 04/25/18   Fayrene Helper, MD  albuterol [facility] HFA) [phone] Base) MCG/ACT inhaler INHALE 2  PUFFS INTO THE LUNGS EVERY 6 HOURS AS NEEDED FOR WHEEZING ORSHORTNESS OF BREATH. 12/17/18   Fayrene Helper, MD  aspirin EC 81 MG tablet Take 81 mg by mouth daily.    [provider]  blood glucose meter kit and supplies Dispense based on patient and insurance preference.Requesting accuchek meter. Once daily testing dx e11.9 12/28/17   Fayrene Helper, MD  busPIRone (BUSPAR) 7.5 MG tablet TAKE 1 TABLET BY MOUTH 3 TIMES DAILY. 08/20/18   Fayrene Helper, MD   clindamycin (CLEOCIN) 150 MG capsule Take 3 capsules (450 mg total) by mouth 4 (four) times daily for 7 days. 01/19/19 01/26/19  Varney Biles, MD  doxycycline (VIBRA-TABS) 100 MG tablet Take 1 tablet (100 mg total) by mouth 2 (two) times daily. 01/20/19   Fayrene Helper, MD  glipiZIDE (GLIPIZIDE XL) 5 MG 24 hr tablet Take 1 tablet (5 mg total) by mouth daily with breakfast. 12/17/18   Fayrene Helper, MD  glucose blood (ACCU-CHEK AVIVA PLUS) test strip USE TO TEST ONCE DAILY 11/27/18   Fayrene Helper, MD  ipratropium-albuterol (DUONEB) 0.5-2.5 (3) MG/3ML SOLN INHALE 1 VIAL VIA NEBULIZER EVERY FOUR HOURS AS NEEDED. 03/02/16   Fayrene Helper, MD  olmesartan (BENICAR) 40 MG tablet TAKE 1 TABLET BY MOUTH ONCE A DAY. 07/11/18   Fayrene Helper, MD  pravastatin (PRAVACHOL) 20 MG tablet TAKE (1) TABLET BY MOUTH AT BEDTIME FOR CHOLESTEROL. 12/17/18   Fayrene Helper, MD  risperiDONE (RISPERDAL) 1 MG tablet 1 tablet by mouth at bedtime 12/17/18   Fayrene Helper, MD  risperidone (RISPERDAL) 4 MG tablet TAKE (1) TABLET BY MOUTH AT BEDTIME. 07/11/18   Fayrene Helper, MD  SYMBICORT 80-4.5 MCG/ACT inhaler INHALE 2 PUFFS INTO LUNGS TWICE DAILY. 12/25/18   Fayrene Helper, MD  tiotropium (SPIRIVA) 18 MCG inhalation capsule Place 1 capsule (18 mcg total) into inhaler and inhale daily. 08/14/18   Fayrene Helper, MD  tiZANidine (ZANAFLEX) 2 MG tablet One tablet once daily as needed , for muscle spasm 09/18/18   Fayrene Helper, MD  traZODone (DESYREL) 50 MG tablet Take 1 tablet (50 mg total) by mouth at bedtime as needed. for sleep 12/17/18   Fayrene Helper, MD  triamterene-hydrochlorothiazide [facility]) 37.5-25 MG tablet Half tablet daily 10/28/18   Fayrene Helper, MD  sildenafil (VIAGRA) 100 MG tablet Take 100 mg by mouth. Take one tablet 30 mins before intercourse   03/11/12  [provider]    Family History Family History  Problem Relation Age of  Onset  . Diabetes Mother   . Hypertension Mother   . Stroke Mother   . Diabetes Sister   . Heart disease Sister   . Kidney disease Father   . Drug abuse Brother   . Colon cancer Neg Hx   . Colon polyps Neg Hx     Social History Social History   Tobacco Use  . Smoking status: Current Every Day Smoker    Packs/day: 1.00    Years: 48.00    Pack years: 48.00    Types: Cigarettes    Last attempt to quit: 05/02/2017    Years since quitting: 1.7  . Smokeless tobacco: Never Used  . Tobacco comment: quit a week ago  Substance Use Topics  . Alcohol use: No    Alcohol/week: 0.0 standard drinks  . Drug use: No     Allergies   Sertraline hcl and Wellbutrin [bupropion]   Review  of Systems Review of Systems  Constitutional: Negative for fever.  Musculoskeletal: Positive for back pain.  Skin: Positive for rash.  Allergic/Immunologic: Negative for immunocompromised state.  Hematological: Does not bruise/bleed easily.  All other systems reviewed and are negative.    Physical Exam Updated Vital Signs BP (!) 123/93   Pulse 78   Temp 98 F (36.7 C) (Oral)   Resp 18   Ht '6\' 1"'$  (1.854 m)   Wt 108.9 kg   SpO2 98%   BMI 31.66 kg/m   Physical Exam Vitals signs and nursing note reviewed.  Constitutional:      Appearance: He is well-developed.  HENT:     Head: Atraumatic.  Neck:     Musculoskeletal: Neck supple.  Cardiovascular:     Rate and Rhythm: Normal rate.  Pulmonary:     Effort: Pulmonary effort is normal.  Musculoskeletal:        General: Swelling present.     Comments: Patient has a large lesion in the back that is nodular with induration.  There is surrounding erythema.  No crepitus.  Patient has tenderness to palpation.  Skin:    General: Skin is warm.  Neurological:     Mental Status: He is alert and oriented to person, place, and time.      ED Treatments / Results  Labs (all labs ordered are listed, but only abnormal results are displayed) Labs  Reviewed  CBG MONITORING, ED - Abnormal; Notable for the following components:      Result Value   Glucose-Capillary 192 (*)    All other components within normal limits  AEROBIC CULTURE (SUPERFICIAL SPECIMEN)    EKG None  Radiology No results found.  Procedures Ultrasound ED Soft Tissue Date/Time: 01/22/2019 7:04 PM Performed by: Varney Biles, MD Authorized by: Varney Biles, MD   Procedure details:    Indications: localization of abscess and evaluate for cellulitis     Transverse view:  Visualized   Longitudinal view:  Visualized   Images: archived   Location:    Location: upper back     Side:  Midline Findings:     abscess present    cellulitis present .Marland KitchenIncision and Drainage Date/Time: 01/22/2019 7:05 PM Performed by: Varney Biles, MD Authorized by: Varney Biles, MD   Consent:    Consent obtained:  Verbal   Consent given by:  Patient   Risks discussed:  Infection, pain and incomplete drainage   Alternatives discussed:  No treatment Universal protocol:    Procedure explained and questions answered to patient or proxy's satisfaction: yes     Site/side marked: yes     Immediately prior to procedure a time out was called: yes     Patient identity confirmed:  Arm band Location:    Type:  Abscess   Size:  6 cm   Location:  Trunk   Trunk location:  Back Pre-procedure details:    Skin preparation:  Chloraprep Anesthesia (see MAR for exact dosages):    Anesthesia method:  Local infiltration   Local anesthetic:  Lidocaine 1% WITH epi Procedure type:    Complexity:  Complex Procedure details:    Needle aspiration: yes     Needle size:  18 G   Incision types:  Single straight   Incision depth:  Subcutaneous   Scalpel blade:  10   Drainage:  Purulent   Drainage amount:  Moderate   Wound treatment:  Drain placed   Packing materials:  1/2 in gauze Post-procedure details:  Patient tolerance of procedure:  Tolerated well, no immediate  complications   (including critical care time)  Medications Ordered in ED Medications  lidocaine (XYLOCAINE) 2 % injection (  Given by Other 01/19/19 0636)  clindamycin (CLEOCIN) capsule 300 mg (300 mg Oral Given 01/19/19 0711)     Initial Impression / Assessment and Plan / ED Course  I have reviewed the triage vital signs and the nursing notes.  Pertinent labs & imaging results that were available during my care of the patient were reviewed by me and considered in my medical decision making (see chart for details).     68 year old male comes in with chief complaint of back pain.  He is noted to have a large area of abscess with surrounding cellulitis.  Ultrasound was performed, and it appears that he has phlegmon as time findings, and not a large abscess pocket.  Therefore we proceeded with needle aspiration to confirm the findings, and the needle aspirate was purulent.  With the consent of the patient we converted to conventional incision and drainage with scalpel.  More purulent drainage was evacuated.  Loculations were removed.  We had bloody discharge at the end.  The abscess appears complicated.  It appears that there is some phlegmonous changes and also some necrotic tissue.  I discussed the case with general surgeon, and they will be happy to follow-up with the patient in the clinic which I think is more appropriate than ED follow-up. Patient and family member also in agreement with the plan. Given that he has a large area of cellulitis along with abscess we will start him on clindamycin.  Final Clinical Impressions(s) / ED Diagnoses   Final diagnoses:  Abscess    ED Discharge Orders         Ordered    clindamycin (CLEOCIN) 150 MG capsule  4 times daily     01/19/19 0727           Varney Biles, MD 01/22/19 1909

## 2019-01-22 NOTE — Telephone Encounter (Signed)
Post ED Visit - Positive Culture Follow-up  Culture report reviewed by antimicrobial stewardship pharmacist:  []  Elenor Quinones, Pharm.D. []  Heide Guile, Pharm.D., BCPS AQ-ID []  Parks Neptune, Pharm.D., BCPS []  Alycia Rossetti, Pharm.D., BCPS []  [place], Pharm.D., BCPS, AAHIVP []  Legrand Como, Pharm.D., BCPS, AAHIVP []  Salome Arnt, PharmD, BCPS []  Johnnette Gourd, PharmD, BCPS []  Hughes Better, PharmD, BCPS []  Leeroy Cha, PharmD H Barid Pharm D Positive Aerobic culture Treated with Clindamycin, organism sensitive to the same and no further patient follow-up is required at this time.  Genia Del 01/22/2019, 1:02 PM

## 2019-01-22 NOTE — Progress Notes (Signed)
Matthew Molina; [id number]; 11/10/1951   HPI Patient is a 68 year old black male who was referred to my care by [name] for evaluation treatment of an infected sebaceous cyst on his back.  He was seen in the emergency room several days ago and underwent incision and drainage.  He was started on antibiotics.  The wound has been taking care of by a neighbor.  Drainage has decreased.  He denies any fevers.  He does have multiple medical problems.  He is still on antibiotics.  He denies any pain in the wound unless pressure is applied. Past Medical History:  Diagnosis Date  . Acute kidney injury ([place])   . Acute respiratory failure with hypoxia ([place])   . Arthritis   . COPD (chronic obstructive pulmonary disease) ([place])   . Depression   . Diabetes mellitus   . Diabetes mellitus without complication ([place])   . Hypercholesterolemia   . Hyperlipidemia   . Hypertension   . Multiple lung nodules on CT 04/02/2015  . Nicotine addiction   . Obesity   . Oxygen deficiency    qhs  . Schizophrenia [facility])     Past Surgical History:  Procedure Laterality Date  . CATARACT EXTRACTION W/PHACO Left 11/20/2013   Procedure: CATARACT EXTRACTION PHACO AND INTRAOCULAR LENS PLACEMENT (IOC);  Surgeon: Tonny Branch, MD;  Location: [facility] ORS;  Service: Ophthalmology;  Laterality: Left;  CDE:10.26  . CATARACT EXTRACTION W/PHACO Right 12/08/2013   Procedure: RIGHT EYE CATARACT EXTRACTION PHACO AND INTRAOCULAR LENS PLACEMENT ;  Surgeon: Tonny Branch, MD;  Location: [facility] ORS;  Service: Ophthalmology;  Laterality: Right;  CDE 12.38  . COLONOSCOPY N/A 04/01/2013   Procedure: COLONOSCOPY;  Surgeon: Danie Binder, MD;  Location: [facility] ENDO SUITE;  Service: Endoscopy;  Laterality: N/A;  10:00 AM-moved to [place] notified pt  . COLONOSCOPY  2014   INCOMPLETE PREP IN R COLON  . ESOPHAGOGASTRODUODENOSCOPY N/A 02/18/2016   Procedure: ESOPHAGOGASTRODUODENOSCOPY (EGD);  Surgeon: Danie Binder, MD;  Location: [facility] ENDO SUITE;  Service:  Endoscopy;  Laterality: N/A;  1145  . EYE SURGERY Left 11/2013   cataract extraction  . SAVORY DILATION N/A 02/18/2016   Procedure: SAVORY DILATION;  Surgeon: Danie Binder, MD;  Location: [facility] ENDO SUITE;  Service: Endoscopy;  Laterality: N/A;  . SHOULDER SURGERY      Family History  Problem Relation Age of Onset  . Diabetes Mother   . Hypertension Mother   . Stroke Mother   . Diabetes Sister   . Heart disease Sister   . Kidney disease Father   . Drug abuse Brother   . Colon cancer Neg Hx   . Colon polyps Neg Hx     Current Outpatient Medications on File Prior to Visit  Medication Sig Dispense Refill  . ACCU-CHEK SOFTCLIX LANCETS lancets TEST BLOOD SUGAR ONCE DAILY AS DIRECTED. 100 each 5  . albuterol (PROAIR HFA) 108 (90 Base) MCG/ACT inhaler INHALE 2 PUFFS INTO THE LUNGS EVERY 6 HOURS AS NEEDED FOR WHEEZING ORSHORTNESS OF BREATH. 8.5 g 5  . aspirin EC 81 MG tablet Take 81 mg by mouth daily.    . blood glucose meter kit and supplies Dispense based on patient and insurance preference.Requesting accuchek meter. Once daily testing dx e11.9 1 each 0  . busPIRone (BUSPAR) 7.5 MG tablet TAKE 1 TABLET BY MOUTH 3 TIMES DAILY. 90 tablet 4  . clindamycin (CLEOCIN) 150 MG capsule Take 3 capsules (450 mg total) by mouth 4 (four) times daily  for 7 days. 84 capsule 0  . doxycycline (VIBRA-TABS) 100 MG tablet Take 1 tablet (100 mg total) by mouth 2 (two) times daily. 20 tablet 0  . glipiZIDE (GLIPIZIDE XL) 5 MG 24 hr tablet Take 1 tablet (5 mg total) by mouth daily with breakfast. 90 tablet 1  . glucose blood (ACCU-CHEK AVIVA PLUS) test strip USE TO TEST ONCE DAILY 100 each 1  . ipratropium-albuterol (DUONEB) 0.5-2.5 (3) MG/3ML SOLN INHALE 1 VIAL VIA NEBULIZER EVERY FOUR HOURS AS NEEDED. 360 mL 2  . olmesartan (BENICAR) 40 MG tablet TAKE 1 TABLET BY MOUTH ONCE A DAY. 90 tablet 1  . pravastatin (PRAVACHOL) 20 MG tablet TAKE (1) TABLET BY MOUTH AT BEDTIME FOR CHOLESTEROL. 90 tablet 1  . risperiDONE  (RISPERDAL) 1 MG tablet 1 tablet by mouth at bedtime 90 tablet 1  . risperidone (RISPERDAL) 4 MG tablet TAKE (1) TABLET BY MOUTH AT BEDTIME. 90 tablet 1  . SYMBICORT 80-4.5 MCG/ACT inhaler INHALE 2 PUFFS INTO LUNGS TWICE DAILY. 10.2 g 0  . tiotropium (SPIRIVA) 18 MCG inhalation capsule Place 1 capsule (18 mcg total) into inhaler and inhale daily. 30 capsule 12  . tiZANidine (ZANAFLEX) 2 MG tablet One tablet once daily as needed , for muscle spasm 30 tablet 5  . traZODone (DESYREL) 50 MG tablet Take 1 tablet (50 mg total) by mouth at bedtime as needed. for sleep 90 tablet 1  . triamterene-hydrochlorothiazide (MAXZIDE-25) 37.5-25 MG tablet Half tablet daily 45 tablet 3  . [DISCONTINUED] sildenafil (VIAGRA) 100 MG tablet Take 100 mg by mouth. Take one tablet 30 mins before intercourse      No current facility-administered medications on file prior to visit.     Allergies  Allergen Reactions  . Sertraline Hcl     Stomach upset/pain  . Wellbutrin [Bupropion] Other (See Comments)    Makes stomach hurt    Social History   Substance and Sexual Activity  Alcohol Use No  . Alcohol/week: 0.0 standard drinks    Social History   Tobacco Use  Smoking Status Current Every Day Smoker  . Packs/day: 1.00  . Years: 48.00  . Pack years: 48.00  . Types: Cigarettes  . Last attempt to quit: 05/02/2017  . Years since quitting: 1.7  Smokeless Tobacco Never Used  Tobacco Comment   quit a week ago    Review of Systems  Constitutional: Positive for malaise/fatigue.  HENT: Positive for sinus pain.   Eyes: Negative.   Respiratory: Positive for cough.   Cardiovascular: Negative.   Gastrointestinal: Negative.   Genitourinary: Negative.   Musculoskeletal: Negative.   Skin: Negative.   Neurological: Negative.   Endo/Heme/Allergies: Negative.   Psychiatric/Behavioral: Negative.     Objective   Vitals:   01/21/19 1323  BP: 127/85  Pulse: 97  Resp: 20  Temp: 98.7 F (37.1 C)     Physical Exam Vitals signs reviewed.  Constitutional:      Appearance: Normal appearance. He is not ill-appearing.  HENT:     Head: Normocephalic and atraumatic.  Cardiovascular:     Rate and Rhythm: Normal rate and regular rhythm.     Heart sounds: Normal heart sounds. No murmur. No friction rub. No gallop.   Pulmonary:     Effort: Pulmonary effort is normal. No respiratory distress.     Breath sounds: Normal breath sounds. No stridor. No wheezing, rhonchi or rales.  Skin:    General: Skin is warm and dry.     Comments: 2 cm  open wound in left upper back with minimal purulent drainage present.  Granulation tissue noted at the base.  Previously placed pen marks showing cellulitis revealed that the erythema is resolving.  The base of the wound was cleaned with a Q-tip and peroxide.  The dry sterile dressing was then applied.  Neurological:     Mental Status: He is alert and oriented to person, place, and time.     Assessment  Infected sebaceous cyst, resolving Plan   No need for further surgical intervention at this time.  The neighbor was instructed on how to clean the wound daily with a Q-tip and peroxide.  Will follow-up in 2 weeks for a wound check.

## 2019-01-24 ENCOUNTER — Emergency Department (HOSPITAL_COMMUNITY)
Admission: EM | Admit: 2019-01-24 | Discharge: 2019-01-24 | Disposition: A | Discharge status: Home / Self Care | Payer: Medicare Other | Attending: Emergency Medicine | Admitting: Emergency Medicine

## 2019-01-24 ENCOUNTER — Emergency Department (HOSPITAL_COMMUNITY): Payer: Medicare Other

## 2019-01-24 ENCOUNTER — Encounter (HOSPITAL_COMMUNITY): Payer: Self-pay | Admitting: Emergency Medicine

## 2019-01-24 ENCOUNTER — Other Ambulatory Visit: Payer: Self-pay

## 2019-01-24 DIAGNOSIS — R0602 Shortness of breath: Secondary | ICD-10-CM | POA: Diagnosis not present

## 2019-01-24 DIAGNOSIS — R079 Chest pain, unspecified: Secondary | ICD-10-CM | POA: Diagnosis not present

## 2019-01-24 DIAGNOSIS — Z5321 Procedure and treatment not carried out due to patient leaving prior to being seen by health care provider: Secondary | ICD-10-CM | POA: Diagnosis not present

## 2019-01-24 LAB — TROPONIN I: Troponin I: <0.03 ng/mL (ref <0.03)

## 2019-01-24 MED ORDER — ALBUTEROL SULFATE (2.5 MG/3ML) 0.083% IN NEBU: 5.0000 mg | INHALATION_SOLUTION | Freq: Once | RESPIRATORY_TRACT | Status: DC

## 2019-01-24 NOTE — ED Triage Notes (Signed)
Patient reports SOB that started this am.

## 2019-01-24 NOTE — ED Notes (Signed)
Registration note that patient left without being seen after Triage.

## 2019-01-28 ENCOUNTER — Telehealth: Payer: Self-pay

## 2019-01-28 DIAGNOSIS — E871 Hypo-osmolality and hyponatremia: Secondary | ICD-10-CM

## 2019-01-28 NOTE — Telephone Encounter (Signed)
-----  Message from Fayrene Helper, MD sent at 01/21/2019  7:30 AM EST ----- Your blood sugar is controlled, no change in medication Blood tests suggest bacterial infection is present , which we are now treating with antibiotics, you need to take them all Your sodium is low, increase intake of salt and reduce water intake to 2 liters / day, you need repeat non fasting chem 7 and eGFR in next week Wednesday to check this again If sodium gets too low you will need to go the hospital for IV fluid , and correction so very important to follow throuh ( pls order test, dx hyponatremia)

## 2019-01-29 ENCOUNTER — Telehealth: Payer: Self-pay | Admitting: Family Medicine

## 2019-01-29 ENCOUNTER — Other Ambulatory Visit: Payer: Self-pay | Admitting: Family Medicine

## 2019-01-29 DIAGNOSIS — E871 Hypo-osmolality and hyponatremia: Secondary | ICD-10-CM | POA: Diagnosis not present

## 2019-01-29 LAB — BASIC METABOLIC PANEL WITH GFR
BUN/Creatinine Ratio: 4 (calc) — ABNORMAL LOW (ref 6–22)
BUN: 6 mg/dL — ABNORMAL LOW (ref 7–25)
CO2: 23 mmol/L (ref 20–32)
Calcium: 9.9 mg/dL (ref 8.6–10.3)
Chloride: 90 mmol/L — ABNORMAL LOW (ref 98–110)
Creat: 1.42 mg/dL — ABNORMAL HIGH (ref 0.70–1.25)
GFR, Est African American: 58 mL/min/{1.73_m2} — ABNORMAL LOW (ref >=60)
GFR, Est Non African American: 50 mL/min/{1.73_m2} — ABNORMAL LOW (ref >=60)
Glucose, Bld: 150 mg/dL — ABNORMAL HIGH (ref 65–139)
Potassium: 4.1 mmol/L (ref 3.5–5.3)
Sodium: 124 mmol/L — ABNORMAL LOW (ref 135–146)

## 2019-01-29 MED ORDER — PROMETHAZINE-DM 6.25-15 MG/5ML PO SYRP: ORAL_SOLUTION | ORAL | 0 refills | Status: DC

## 2019-01-29 NOTE — Telephone Encounter (Signed)
Medication is sent to [facility]

## 2019-01-29 NOTE — Telephone Encounter (Signed)
Spoke with patient he is aware

## 2019-01-29 NOTE — Progress Notes (Signed)
Ab

## 2019-01-29 NOTE — Telephone Encounter (Signed)
Please advise 

## 2019-01-29 NOTE — Telephone Encounter (Signed)
Pt stopped by wanting something called in for cough, states been going on for a couple of weeks, didn't want to be seen.

## 2019-01-31 ENCOUNTER — Emergency Department (HOSPITAL_COMMUNITY)
Admission: EM | Admit: 2019-01-31 | Discharge: 2019-01-31 | Disposition: A | Discharge status: Home / Self Care | Payer: Medicare Other | Attending: Emergency Medicine | Admitting: Emergency Medicine

## 2019-01-31 ENCOUNTER — Encounter (HOSPITAL_COMMUNITY): Payer: Self-pay | Admitting: Emergency Medicine

## 2019-01-31 ENCOUNTER — Other Ambulatory Visit: Payer: Self-pay

## 2019-01-31 ENCOUNTER — Other Ambulatory Visit: Payer: Self-pay | Admitting: Family Medicine

## 2019-01-31 ENCOUNTER — Emergency Department (HOSPITAL_COMMUNITY): Payer: Medicare Other

## 2019-01-31 DIAGNOSIS — F17210 Nicotine dependence, cigarettes, uncomplicated: Secondary | ICD-10-CM | POA: Diagnosis not present

## 2019-01-31 DIAGNOSIS — R000 Tachycardia, unspecified: Secondary | ICD-10-CM | POA: Diagnosis not present

## 2019-01-31 DIAGNOSIS — Z7984 Long term (current) use of oral hypoglycemic drugs: Secondary | ICD-10-CM | POA: Diagnosis not present

## 2019-01-31 DIAGNOSIS — E1122 Type 2 diabetes mellitus with diabetic chronic kidney disease: Secondary | ICD-10-CM | POA: Diagnosis not present

## 2019-01-31 DIAGNOSIS — R0602 Shortness of breath: Secondary | ICD-10-CM | POA: Diagnosis not present

## 2019-01-31 DIAGNOSIS — N183 Chronic kidney disease, stage 3 (moderate): Secondary | ICD-10-CM | POA: Insufficient documentation

## 2019-01-31 DIAGNOSIS — I10 Essential (primary) hypertension: Secondary | ICD-10-CM | POA: Diagnosis not present

## 2019-01-31 DIAGNOSIS — I129 Hypertensive chronic kidney disease with stage 1 through stage 4 chronic kidney disease, or unspecified chronic kidney disease: Secondary | ICD-10-CM | POA: Insufficient documentation

## 2019-01-31 DIAGNOSIS — J441 Chronic obstructive pulmonary disease with (acute) exacerbation: Secondary | ICD-10-CM | POA: Insufficient documentation

## 2019-01-31 DIAGNOSIS — R05 Cough: Secondary | ICD-10-CM | POA: Diagnosis not present

## 2019-01-31 DIAGNOSIS — Z79899 Other long term (current) drug therapy: Secondary | ICD-10-CM | POA: Insufficient documentation

## 2019-01-31 DIAGNOSIS — Z7982 Long term (current) use of aspirin: Secondary | ICD-10-CM | POA: Insufficient documentation

## 2019-01-31 DIAGNOSIS — Z9119 Patient's noncompliance with other medical treatment and regimen: Secondary | ICD-10-CM | POA: Insufficient documentation

## 2019-01-31 DIAGNOSIS — R0689 Other abnormalities of breathing: Secondary | ICD-10-CM | POA: Diagnosis not present

## 2019-01-31 LAB — BASIC METABOLIC PANEL
Anion gap: 10 (ref 5–15)
BUN: 7 mg/dL — ABNORMAL LOW (ref 8–23)
CO2: 22 mmol/L (ref 22–32)
Calcium: 9.8 mg/dL (ref 8.9–10.3)
Chloride: 94 mmol/L — ABNORMAL LOW (ref 98–111)
Creatinine, Ser: 1.29 mg/dL — ABNORMAL HIGH (ref 0.61–1.24)
GFR calc Af Amer: >60 mL/min (ref >60)
GFR calc non Af Amer: 56 mL/min — ABNORMAL LOW (ref >60)
Glucose, Bld: 133 mg/dL — ABNORMAL HIGH (ref 70–99)
Potassium: 3.8 mmol/L (ref 3.5–5.1)
Sodium: 126 mmol/L — ABNORMAL LOW (ref 135–145)

## 2019-01-31 LAB — CBC WITH DIFFERENTIAL/PLATELET
Abs Immature Granulocytes: 0.04 10*3/uL (ref 0.00–0.07)
Basophils Absolute: 0.1 10*3/uL (ref 0.0–0.1)
Basophils Relative: 1 %
Eosinophils Absolute: 1 10*3/uL — ABNORMAL HIGH (ref 0.0–0.5)
Eosinophils Relative: 12 %
HCT: 37.9 % — ABNORMAL LOW (ref 39.0–52.0)
Hemoglobin: 12.2 g/dL — ABNORMAL LOW (ref 13.0–17.0)
Immature Granulocytes: 1 %
Lymphocytes Relative: 18 %
Lymphs Abs: 1.4 10*3/uL (ref 0.7–4.0)
MCH: 28.4 pg (ref 26.0–34.0)
MCHC: 32.2 g/dL (ref 30.0–36.0)
MCV: 88.1 fL (ref 80.0–100.0)
Monocytes Absolute: 0.4 10*3/uL (ref 0.1–1.0)
Monocytes Relative: 5 %
Neutro Abs: 5.1 10*3/uL (ref 1.7–7.7)
Neutrophils Relative %: 63 %
Platelets: 335 10*3/uL (ref 150–400)
RBC: 4.3 MIL/uL (ref 4.22–5.81)
RDW: 13.6 % (ref 11.5–15.5)
WBC: 7.9 10*3/uL (ref 4.0–10.5)
nRBC: 0 % (ref 0.0–0.2)

## 2019-01-31 MED ORDER — ALBUTEROL (5 MG/ML) CONTINUOUS INHALATION SOLN
20.0000 mg/h | INHALATION_SOLUTION | RESPIRATORY_TRACT | Status: AC
  Administered 2019-01-31: 20 mg/h via RESPIRATORY_TRACT
  Filled 2019-01-31: qty 20

## 2019-01-31 MED ORDER — IPRATROPIUM-ALBUTEROL 0.5-2.5 (3) MG/3ML IN SOLN: 3.0000 mL | RESPIRATORY_TRACT | Status: DC | PRN

## 2019-01-31 MED ORDER — DOXYCYCLINE HYCLATE 100 MG PO TABS: 100.0000 mg | ORAL_TABLET | Freq: Two times a day (BID) | ORAL | 0 refills | Status: AC

## 2019-01-31 MED ORDER — GUAIFENESIN ER 600 MG PO TB12
600.0000 mg | ORAL_TABLET | Freq: Once | ORAL | Status: AC
  Administered 2019-01-31: 600 mg via ORAL
  Filled 2019-01-31: qty 1

## 2019-01-31 MED ORDER — PREDNISONE 50 MG PO TABS: 50.0000 mg | ORAL_TABLET | Freq: Every day | ORAL | 0 refills | Status: AC

## 2019-01-31 NOTE — ED Provider Notes (Signed)
[facility] EMERGENCY DEPARTMENT Provider Note   CSN: [id number] Arrival date & time: 01/31/19  5397     History   Chief Complaint Chief Complaint  Patient presents with  . Shortness of Breath    HPI Matthew Molina is a 68 y.o. male with PMH significant for COPD on home 3L O2 prn, DM, HTN, HLD with chief complaint of SOB.  Patient states that he has had gradual onset of SOB and cough for the past 2 weeks. He states it started in the setting of cold like symptoms with nasal congestion and rhinorrhea without fever/chills. He states that it acutely worsened over the last 3 days to the point that he has needed his home O2 of 3L continuously. He usually only needs it once every few weeks. He has also been using his home albuterol inhaler regularly over the last few days. He states that while he is generally compliant on home symbicort that he misses his spiriva about half the time. His cough is productive of yellow-brownish sputum. No fever/chills. No body aches.         Past Medical History:  Diagnosis Date  . Acute kidney injury ([place])   . Acute respiratory failure with hypoxia ([place])   . Arthritis   . COPD (chronic obstructive pulmonary disease) ([place])   . Depression   . Diabetes mellitus   . Diabetes mellitus without complication ([place])   . Hypercholesterolemia   . Hyperlipidemia   . Hypertension   . Multiple lung nodules on CT 04/02/2015  . Nicotine addiction   . Obesity   . Oxygen deficiency    qhs  . Schizophrenia [facility])     Patient Active Problem List   Diagnosis Date Noted  . Boil 01/20/2019  . Unsteady gait 04/18/2017  . Elevated PSA 10/01/2016  . External hemorrhoid, thrombosed 08/24/2016  . GERD (gastroesophageal reflux disease) 08/24/2016  . CKD (chronic kidney disease) stage 3, GFR 30-59 ml/min (HCC) 05/22/2016  . Insomnia 01/05/2016  . COPD (chronic obstructive pulmonary disease) ([place]) 11/05/2015  . Hypertension 11/05/2015  . Tobacco abuse 07/24/2015  .  Coronary atherosclerosis of native coronary artery 04/02/2015  . Multiple lung nodules on CT 04/02/2015  . GAD (generalized anxiety disorder) 07/04/2012  . Back pain with radiation 11/01/2011  . Diabetes mellitus type 2 in obese ([place]) 04/17/2011  . Hyperlipidemia 06/02/2008  . Obesity (BMI 30.0-34.9) 06/02/2008  . Schizophrenia, unspecified type ([place]) 06/02/2008    Past Surgical History:  Procedure Laterality Date  . CATARACT EXTRACTION W/PHACO Left 11/20/2013   Procedure: CATARACT EXTRACTION PHACO AND INTRAOCULAR LENS PLACEMENT (IOC);  Surgeon: Tonny Branch, MD;  Location: [facility] ORS;  Service: Ophthalmology;  Laterality: Left;  CDE:10.26  . CATARACT EXTRACTION W/PHACO Right 12/08/2013   Procedure: RIGHT EYE CATARACT EXTRACTION PHACO AND INTRAOCULAR LENS PLACEMENT ;  Surgeon: Tonny Branch, MD;  Location: [facility] ORS;  Service: Ophthalmology;  Laterality: Right;  CDE 12.38  . COLONOSCOPY N/A 04/01/2013   Procedure: COLONOSCOPY;  Surgeon: Danie Binder, MD;  Location: [facility] ENDO SUITE;  Service: Endoscopy;  Laterality: N/A;  10:00 AM-moved to [place] notified pt  . COLONOSCOPY  2014   INCOMPLETE PREP IN R COLON  . ESOPHAGOGASTRODUODENOSCOPY N/A 02/18/2016   Procedure: ESOPHAGOGASTRODUODENOSCOPY (EGD);  Surgeon: Danie Binder, MD;  Location: [facility] SU[facility];  Service: Endoscopy;  Laterality: N/A;  1145  . EYE SURGERY Left 11/2013   cataract extraction  . SAVORY DILATION N/A 02/18/2016   Procedure: SAVORY DILATION;  Surgeon:  Danie Binder, MD;  Location: [facility] END[facility] SU[facility];  Service: Endoscopy;  Laterality: N/A;  . SHOULDER SURGERY          Home Medications    Prior to Admission medications   Medication Sig Start Date End Date Taking? Authorizing Provider  ACCU-CHEK SOFTCLIX LANCETS lancets TEST BLOOD SUGAR ONCE DAILY AS DIRECTED. 04/25/18   Fayrene Helper, MD  albuterol (PROAIR HFA) 108 (90 Base) MCG/ACT inhaler INHALE 2 PUFFS INTO THE LUNGS EVERY 6 HOURS AS NEEDED FOR WHEEZING ORSHORTNESS OF  BREATH. 12/17/18   Fayrene Helper, MD  aspirin EC 81 MG tablet Take 81 mg by mouth daily.    [provider]  blood glucose meter kit and supplies Dispense based on patient and insurance preference.Requesting accuchek meter. Once daily testing dx e11.9 12/28/17   Fayrene Helper, MD  busPIRone (BUSPAR) 7.5 MG tablet TAKE 1 TABLET BY MOUTH 3 TIMES DAILY. 08/20/18   Fayrene Helper, MD  doxycycline (VIBRA-TABS) 100 MG tablet Take 1 tablet (100 mg total) by mouth 2 (two) times daily. 01/20/19   Fayrene Helper, MD  glipiZIDE (GLIPIZIDE XL) 5 MG 24 hr tablet Take 1 tablet (5 mg total) by mouth daily with breakfast. 12/17/18   Fayrene Helper, MD  glucose blood (ACCU-CHEK AVIVA PLUS) test strip USE TO TEST ONCE DAILY 11/27/18   Fayrene Helper, MD  ipratropium-albuterol (DUONEB) 0.5-2.5 (3) MG/3ML SOLN USE 1 VIAL(3 ML) IN NEBULIZER EVERY 4 TO 6 HOURS 01/29/19   Fayrene Helper, MD  olmesartan (BENICAR) 40 MG tablet TAKE 1 TABLET BY MOUTH ONCE A DAY. 07/11/18   Fayrene Helper, MD  pravastatin (PRAVACHOL) 20 MG tablet TAKE (1) TABLET BY MOUTH AT BEDTIME FOR CHOLESTEROL. 12/17/18   Fayrene Helper, MD  promethazine-dextromethorphan (PROMETHAZINE-DM) 6.25-15 MG/5ML syrup One teaspoon at bedtime , as needed, for excessive cough 01/29/19   Fayrene Helper, MD  risperiDONE (RISPERDAL) 1 MG tablet 1 tablet by mouth at bedtime 12/17/18   Fayrene Helper, MD  risperidone (RISPERDAL) 4 MG tablet TAKE (1) TABLET BY MOUTH AT BEDTIME. 07/11/18   Fayrene Helper, MD  SYMBICORT 80-4.5 MCG/ACT inhaler INHALE 2 PUFFS INTO LUNGS TWICE DAILY. 12/25/18   Fayrene Helper, MD  tiotropium (SPIRIVA) 18 MCG inhalation capsule Place 1 capsule (18 mcg total) into inhaler and inhale daily. 08/14/18   Fayrene Helper, MD  tiZANidine (ZANAFLEX) 2 MG tablet One tablet once daily as needed , for muscle spasm 09/18/18   Fayrene Helper, MD  traZODone (DESYREL) 50 MG tablet Take 1  tablet (50 mg total) by mouth at bedtime as needed. for sleep 12/17/18   Fayrene Helper, MD  triamterene-hydrochlorothiazide [facility]) 37.5-25 MG tablet Half tablet daily 10/28/18   Fayrene Helper, MD  sildenafil (VIAGRA) 100 MG tablet Take 100 mg by mouth. Take one tablet 30 mins before intercourse   03/11/12  [provider]    Family History Family History  Problem Relation Age of Onset  . Diabetes Mother   . Hypertension Mother   . Stroke Mother   . Diabetes Sister   . Heart disease Sister   . Kidney disease Father   . Drug abuse Brother   . Colon cancer Neg Hx   . Colon polyps Neg Hx     Social History Social History   Tobacco Use  . Smoking status: Current Every Day Smoker    Packs/day: 1.00    Years: 48.00  Pack years: 48.00    Types: Cigarettes    Last attempt to quit: 05/02/2017    Years since quitting: 1.7  . Smokeless tobacco: Never Used  . Tobacco comment: quit a week ago  Substance Use Topics  . Alcohol use: No    Alcohol/week: 0.0 standard drinks  . Drug use: No     Allergies   Sertraline hcl and Wellbutrin [bupropion]   Review of Systems Review of Systems  Constitutional: Negative for chills, diaphoresis and fever.  HENT: Positive for congestion. Negative for rhinorrhea and sore throat.   Respiratory: Positive for cough, shortness of breath and wheezing. Negative for chest tightness.   Cardiovascular: Negative for chest pain, palpitations and leg swelling.  Gastrointestinal: Negative for abdominal pain, constipation, diarrhea, nausea and vomiting.  Genitourinary: Negative for difficulty urinating and dysuria.  Musculoskeletal: Negative for arthralgias.  Skin: Positive for wound (had abscess on back removed recently).     Physical Exam Updated Vital Signs BP 133/79 (BP Location: Right Arm)   Pulse (!) 118   Temp 98.1 F (36.7 C) (Oral)   Resp 18   Ht '6\' 1"'$  (1.854 m)   Wt 108.9 kg   SpO2 95%   BMI 31.66 kg/m    Physical Exam Constitutional:      General: He is not in acute distress.    Appearance: He is well-developed. He is obese. He is not ill-appearing, toxic-appearing or diaphoretic.  HENT:     Head: Normocephalic and atraumatic.     Mouth/Throat:     Mouth: Mucous membranes are moist.     Pharynx: No pharyngeal swelling or oropharyngeal exudate.     Comments: Erythematous Eyes:     Extraocular Movements: Extraocular movements intact.  Neck:     Musculoskeletal: Normal range of motion and neck supple.  Cardiovascular:     Rate and Rhythm: Regular rhythm. Tachycardia present.     Heart sounds: No murmur.  Pulmonary:     Effort: Pulmonary effort is normal. No tachypnea, accessory muscle usage or respiratory distress.     Breath sounds: Wheezing present.     Comments: Poor air movement with diffuse wheezing throughout Abdominal:     General: Bowel sounds are normal.     Palpations: Abdomen is soft.     Tenderness: There is no guarding or rebound.  Musculoskeletal: Normal range of motion.     Right lower leg: No edema.     Left lower leg: No edema.  Skin:    General: Skin is warm and dry.     Comments: 1 x 62m scab in middle of upper back without erythema and appears to be healing well  Neurological:     General: No focal deficit present.     Mental Status: He is alert and oriented to person, place, and time.  Psychiatric:        Mood and Affect: Mood normal.      ED Treatments / Results  Labs (all labs ordered are listed, but only abnormal results are displayed) Labs Reviewed  BASIC METABOLIC PANEL  CBC WITH DIFFERENTIAL/PLATELET    EKG None  Radiology Dg Chest 2 View  Result Date: 01/31/2019 CLINICAL DATA:  Shortness of breath, cough. EXAM: CHEST - 2 VIEW COMPARISON:  Radiographs of January 24, 2019. FINDINGS: The heart size and mediastinal contours are within normal limits. Both lungs are clear. No pneumothorax or pleural effusion is noted. The visualized  skeletal structures are unremarkable. IMPRESSION: No active cardiopulmonary disease. Electronically Signed  By: Marijo Conception, M.D.   On: 01/31/2019 09:48    Procedures Procedures (including critical care time)  Medications Ordered in ED Medications  guaiFENesin (MUCINEX) 12 hr tablet 600 mg (has no administration in time range)  albuterol (PROVENTIL,VENTOLIN) solution continuous neb (has no administration in time range)     Initial Impression / Assessment and Plan / ED Course  I have reviewed the triage vital signs and the nursing notes.  Pertinent labs & imaging results that were available during my care of the patient were reviewed by me and considered in my medical decision making (see chart for details).    Patient here with SOB, cough with increased sputum production and hypoxia requiring 3L O2 via nasal cannula c/w COPD exacerbation. Most likely trigger was a viral URI given his symptoms of congestion and rhinorrhea a couple of week ago. He likely progressively worsened also due to noncompliance of his home inhalers. He received solumedrol x1 and duonebs x1 en route by EMS. Will give continuous albuterol neb for 1 hour and reassess.   Upon reassessment still having wheezing on exam but much better air movement. Is satting 94-95% on RA. While he lives alone does have a neighbor who can check on him. Checked ambulatory pulse ox and patient did well with sats in 91-95% on RA. Since he has home oxygen as needed and some social support will DC home on prednisone burst and doxycycline. Given return precautions and educated on importance of staying on controller medications.   Final Clinical Impressions(s) / ED Diagnoses   Final diagnoses:  COPD exacerbation [facility])    ED Discharge Orders         Ordered    doxycycline (VIBRA-TABS) 100 MG tablet  2 times daily     01/31/19 1141    predniSONE (DELTASONE) 50 MG tablet  Daily     01/31/19 1141           Bufford Lope,  DO 01/31/19 1146    Elnora Morrison, MD 02/03/19 2329

## 2019-01-31 NOTE — ED Notes (Signed)
Ambulated patient around nursing station. Patient's O2 saturation was 95% on room air prior to ambulation and decreased to 91% on room air during ambulation. Heart rate 113 bpm upon return to room. Patient complaining of "a little short of breath" after ambulation. States he uses home O2, 2L as needed. Placed patient on 2L O2 via nasal canula upon return to room as requested by patient.

## 2019-01-31 NOTE — Discharge Instructions (Addendum)
Take antibiotic doxycycline twice a day for 7 days.  Take prednisone 50mg  a day starting tomorrow (02/01/19) for 5 days.   Get over the counter mucinex to help with the cough.

## 2019-01-31 NOTE — ED Triage Notes (Signed)
EMS report pt been SOB for 2 week and increasing got worse in last 3 days.  Seen PCP Wed and not relief.  Given solumedrol and duoneb via EMS.  #18 eft AC.

## 2019-02-01 DIAGNOSIS — J449 Chronic obstructive pulmonary disease, unspecified: Secondary | ICD-10-CM | POA: Diagnosis not present

## 2019-02-03 ENCOUNTER — Other Ambulatory Visit: Payer: Self-pay

## 2019-02-03 NOTE — Patient Outreach (Signed)
[place] [facility]) Care Management  02/03/2019  Matthew Molina 1951/06/13 [id number]  TELEPHONE SCREENING Referral date:c 01/21/19 Referral source: utilization management Referral reason: medication assistance Insurance: [employer] Attempt #1  Telephone call to patient regarding referral. Unable to reach patient. HIPAA compliant voice message left with call back phone number.   PLAN: RNCM will attempt 2nd telephone call to patient within 4 business days. RNCM will send outreach letter.   Quinn Plowman RN,BSN, [place] Telephonic  [phone]

## 2019-02-04 ENCOUNTER — Ambulatory Visit: Payer: Medicare Other | Admitting: General Surgery

## 2019-02-06 ENCOUNTER — Other Ambulatory Visit: Payer: Self-pay

## 2019-02-06 NOTE — Patient Outreach (Signed)
[place] [facility]) Care Management  02/06/2019  JOHNE BUCKLE 04-02-1951 [id number]  TELEPHONE SCREENING Referral date:c 01/21/19 Referral source: utilization management Referral reason: medication assistance Insurance: [employer] Attempt #2  Telephone call to patient regarding utilization management .  Unable to reach patient. HIPAA compliant voice message left with call back phone number.   PLAN: RNCM will attempt #3 telephone call to patient within 4 business days.   Quinn Plowman RN,BSN, [place] Telephonic  [phone]

## 2019-02-11 ENCOUNTER — Other Ambulatory Visit: Payer: Self-pay

## 2019-02-11 NOTE — Patient Outreach (Signed)
[place] [facility]) Care Management  02/11/2019  EYTHAN JAYNE 11-05-1951 [id number]   Referral received from [place] for chronic disease management.  Successful outreach with [name]. HIPAA verifiers obtained. Discussed reason for referral and [facility] services. Member agreeable to further outreach and initial assessment on 02/20/19. He denied immediate needs or urgent concerns and pending follow-up with general surgery later this week. Contact information provided. Member encouraged to contact RNCM if needed prior to scheduled home visit.   PLAN Will follow up next week.   [place] [phone]

## 2019-02-11 NOTE — Patient Outreach (Addendum)
[facility]) Care Management  02/11/2019  Matthew Molina 1951-07-12 [id number]  TELEPHONE SCREENING Referral date:c 01/21/19 Referral source:utilization management Referral reason:medication assistance Insurance:[employer]  Telephone call to patient regarding utilization management referral.  HIPAA verified with patient. Explained reason for call. Patient states he is having issues with his blood sugar and blood pressure. He states he is having a hard time keeping them checked.  Patient states he is unsure is his blood sugar is going up or down.  He reports he forgets to check his blood sugar and blood pressure at time. Patient states he is on medication for his blood sugar and blood pressure but he forgets to take them sometimes.  Patient states he is also having a hard time affording his medications.  He reports getting his medications from [place] and [place].    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    Patient reports he has COPD. He states he has difficulty with his breathing at times. He states he uses oxygen at 2 L as needed, nebulizer and his inhalers.  Patient states he only sees his primary MD for his conditions. He denies seeing a specialist.  Patient reports he was in the emergency department within the past 30 days for a boil on his back and difficulty breathing due to his COPD.  RNCM discussed and offered [facility] care management services. Patient verbally agreed.   PLAN; RNCM will refer patient to community case manager and pharmacist.   Quinn Plowman RN,BSN,CCM [facility] Telephonic  [phone]

## 2019-02-13 ENCOUNTER — Encounter: Payer: Self-pay | Admitting: General Surgery

## 2019-02-13 ENCOUNTER — Ambulatory Visit: Payer: Self-pay

## 2019-02-13 ENCOUNTER — Ambulatory Visit (INDEPENDENT_AMBULATORY_CARE_PROVIDER_SITE_OTHER): Payer: Medicare Other | Admitting: General Surgery

## 2019-02-13 ENCOUNTER — Other Ambulatory Visit: Payer: Self-pay

## 2019-02-13 VITALS — BP 125/79 | HR 104 | Temp 98.7°F | Resp 22 | Wt 240.0 lb

## 2019-02-13 DIAGNOSIS — L089 Local infection of the skin and subcutaneous tissue, unspecified: Secondary | ICD-10-CM

## 2019-02-13 DIAGNOSIS — L723 Sebaceous cyst: Secondary | ICD-10-CM | POA: Diagnosis not present

## 2019-02-13 NOTE — Progress Notes (Signed)
Subjective:     Matthew Molina  Here for follow-up of infected sebaceous cyst on his back.  Patient reports the wound is healed over.  He has no complaints. Objective:    BP 125/79 (BP Location: Left Arm, Patient Position: Sitting, Cuff Size: Large)   Pulse (!) 104   Temp 98.7 F (37.1 C) (Temporal)   Resp (!) 22   Wt 240 lb (108.9 kg)   BMI 31.66 kg/m   General:  alert, cooperative and no distress  Back: Wound over incised sebaceous cyst has healed.  No residual induration or erythema noted.     Assessment:    Infected sebaceous cyst, resolved    Plan:   Follow-up here as needed.

## 2019-02-13 NOTE — Patient Outreach (Signed)
[facility]) Care Management  [place]   02/13/2019  ZURI BRADWAY 04-Feb-1951 [id number]  Reason for referral: Medication Management and Medication Assitance  Referral source: [facility] RN Current insurance:[employer]  PMHx includes but not limited to:  Coronary atherosclerosis, hypertension, COPD, GERD, type 2 diabetes mellitus, CKD Stage III, schizophrenia and hyperlipidemia  Outreach:  Successful telephone call with [name].  HIPAA identifiers verified.    Subjective:  Mr. Petion reports that he is doing well.  He states that he went to see Dr. Arnoldo Morale today and his boil is completely healed.  He states that he doesn't check his glucose as often as in the past because he just doesn't remember.  He states that he checked his CBG 2-3 days ago, but could not tell me the value.  He reports that he is short of breath, but that his "normal."  He states that he has no desire to stop smoking  Objective: Lab Results  Component Value Date   CREATININE 1.29 (H) 01/31/2019   CREATININE 1.42 (H) 01/29/2019   CREATININE 1.46 (H) 01/20/2019    Lab Results  Component Value Date   HGBA1C 7.4 (H) 01/20/2019    Lipid Panel     Component Value Date/Time   CHOL 174 09/18/2018 1355   TRIG 256 (H) 09/18/2018 1355   HDL 38 (L) 09/18/2018 1355   CHOLHDL 4.6 09/18/2018 1355   VLDL 37 (H) 07/05/2017 0742   LDLCALC 99 09/18/2018 1355    BP Readings from Last 3 Encounters:  02/13/19 125/79  01/31/19 121/80  01/24/19 (!) 149/80    Allergies  Allergen Reactions  . Sertraline Hcl     Stomach upset/pain  . Wellbutrin [Bupropion] Other (See Comments)    Makes stomach hurt    Medications Reviewed Today    Reviewed by Dionne Milo, [facility] (Pharmacist) on 02/13/19 at 1406  Med List Status: <None>  Medication Order Taking? Sig Documenting Provider Last Dose Status Informant  ACCU-CHEK SOFTCLIX LANCETS lancets [id number] Yes TEST BLOOD SUGAR ONCE DAILY  AS DIRECTED. Fayrene Helper, MD Taking Active Self  acetaminophen (TYLENOL) 500 MG tablet [id number] Yes Take 500-1,000 mg by mouth daily as needed. Takes mostly for HA about 2 x/week. [provider] Taking Active   albuterol [facility] HFA) 108 (90 Base) MCG/ACT inhaler [id number] Yes INHALE 2 PUFFS INTO THE LUNGS EVERY 6 HOURS AS NEEDED FOR WHEEZING ORSHORTNESS OF BREATH. Fayrene Helper, MD Taking Active   aspirin EC 81 MG tablet [id number] Yes Take 81 mg by mouth daily. [provider] Taking Active Self  blood glucose meter kit and supplies [id number] Yes Dispense based on patient and insurance preference.Requesting accuchek meter. Once daily testing dx e11.9 Fayrene Helper, MD Taking Active Self  busPIRone (BUSPAR) 7.5 MG tablet [id number] Yes TAKE 1 TABLET BY MOUTH 3 TIMES DAILY. Fayrene Helper, MD Taking Active   glipiZIDE (GLIPIZIDE XL) 5 MG 24 hr tablet [id number] Yes Take 1 tablet (5 mg total) by mouth daily with breakfast. Fayrene Helper, MD Taking Active   glucose blood (ACCU-CHEK AVIVA PLUS) test strip [id number] Yes USE TO TEST ONCE DAILY Fayrene Helper, MD Taking Active   ipratropium-albuterol (DUONEB) 0.5-2.5 (3) MG/3ML Bailey Mech [id number] Yes USE 1 VIAL(3 ML) IN NEBULIZER EVERY 4 TO 6 HOURS Fayrene Helper, MD Taking Active   olmesartan (BENICAR) 40 MG tablet [id number] Yes TAKE 1 TABLET BY MOUTH ONCE A DAY. Fayrene Helper, MD Taking Active  Self  pravastatin (PRAVACHOL) 20 MG tablet [id number] Yes TAKE (1) TABLET BY MOUTH AT BEDTIME FOR CHOLESTEROL. Fayrene Helper, MD Taking Active   promethazine-dextromethorphan (PROMETHAZINE-DM) 6.25-15 MG/5ML syrup [id number] Yes One teaspoon at bedtime , as needed, for excessive cough Fayrene Helper, MD Taking Active   risperiDONE (RISPERDAL) 1 MG tablet [id number] Yes 1 tablet by mouth at bedtime  Patient taking differently:  1 tablet by mouth at bedtime with a 4 mg tablet to = a 5 mg  dose    Fayrene Helper, MD Taking Active   risperidone (RISPERDAL) 4 MG tablet [id number] Yes TAKE (1) TABLET BY MOUTH AT BEDTIME.  Patient taking differently:  Takes with a 1 mg tablet at night to = 5 mg dose   Fayrene Helper, MD Taking Active Self        Discontinued 03/11/12 2224 (No longer needed (for PRN medications))   SYMBICORT 80-4.5 MCG/ACT inhaler [id number] Yes INHALE 2 PUFFS INTO LUNGS TWICE DAILY. Fayrene Helper, MD Taking Active   tiotropium [facility]) 18 MCG inhalation capsule [id number] Yes Place 1 capsule (18 mcg total) into inhaler and inhale daily. Fayrene Helper, MD Taking Active   tiZANidine (ZANAFLEX) 2 MG tablet [id number] Yes One tablet once daily as needed , for muscle spasm  Patient taking differently:  Take 2 mg by mouth. One tablet once daily as needed , for muscle spasm Pt. Takes everyday.   Fayrene Helper, MD Taking Active   traZODone (DESYREL) 50 MG tablet [id number] Yes Take 1 tablet (50 mg total) by mouth at bedtime as needed. for sleep Fayrene Helper, MD Taking Active   triamterene-hydrochlorothiazide [facility]) 37.5-25 MG tablet [id number] Yes Half tablet daily  Patient taking differently:  Take 0.5 tablets by mouth daily. Half tablet daily   Fayrene Helper, MD Taking Active   Med List Note Lisette Abu 02/26/18 1909): Pharmacy records used to confirm medications          Assessment:  Drugs sorted by system:  Neurologic/Psychologic: buspirone, risperidone, trazodone  Cardiovascular: aspirin 81 mg, olmesartan, pravastatin, triamterene/hydrochlorthiazide  Pulmonary/Allergy: albuterol MDI, ipratropium/albuterol, Symbicort, tiotopium  Endocrine: glipizide  Pain: acetaminophen  Miscellaneous: promethazine/dextromethorphan, tizanidine   Medication Review Findings: Mr. Delfavero reports that he is not interested in compliance pill packaging and that he is not having difficulty remembering to take his medications as  prescribed.   Requested that he move his glucometer to the table bedside his chair, so he will remember to check it the first thing every morning.  Stressed the importance of checking his glucose daily. He verbalized understanding.   Medication Assistance Findings:  Mr. Domangue reports that he can't afford his medications because, "I other bills."  Verified with his pharmacy that he has Full [employer] LIS.   He was recently prescribed a $31 cough medicine, but the expense is because that drug was not covered by [employer].  Informed him to ask his pharmacist to call the MD for an alternative medication that is covered if that happens again.    Plan: Route discipline closure letter to PCP, [name].   Joetta Manners, PharmD Clinical Pharmacist [place] [phone]

## 2019-02-20 ENCOUNTER — Other Ambulatory Visit: Payer: Self-pay

## 2019-02-20 NOTE — Patient Outreach (Signed)
[facility]) Care Management   02/20/2019  Matthew Molina 02/04/1951 [id number]  Matthew Molina is an 68 y.o. male  Subjective:  RNCM in for initial home visit. Member alert and oriented x 3. Denied complaints of pain. In no apparent distress at RNCM's arrival.   Objective:   BP 118/72 (BP Location: Right Arm, Patient Position: Sitting, Cuff Size: Normal)   Pulse 88   Resp 18   SpO2 97%   Review of Systems  Constitutional: Negative.   HENT: Positive for tinnitus. Negative for congestion, ear discharge, ear pain, hearing loss, nosebleeds, sinus pain and sore throat.   Eyes: Negative for pain, discharge and redness.  Respiratory: Positive for cough. Negative for sputum production.   Cardiovascular: Negative for chest pain and palpitations.  Gastrointestinal: Negative for abdominal pain, blood in stool, constipation, diarrhea, heartburn, nausea and vomiting.  Genitourinary: Positive for frequency. Negative for dysuria, flank pain, hematuria and urgency.  Musculoskeletal: Negative for falls.  Skin: Positive for itching.       Generalized itching.  Neurological: Negative for dizziness and headaches.    Physical Exam  Constitutional: He is oriented to person, place, and time. He appears well-developed and well-nourished.  Cardiovascular: Normal rate.  Respiratory: Effort normal.  GI: Soft.  Neurological: He is alert and oriented to person, place, and time.  Skin: Skin is warm and dry.  Psychiatric: He has a normal mood and affect. His behavior is normal. Judgment and thought content normal.    Encounter Medications:   Outpatient Encounter Medications as of 02/20/2019  Medication Sig  . acetaminophen (TYLENOL) 500 MG tablet Take 500-1,000 mg by mouth daily as needed. Takes mostly for HA about 2 x/week.  Marland Kitchen albuterol (PROAIR HFA) 108 (90 Base) MCG/ACT inhaler INHALE 2 PUFFS INTO THE LUNGS EVERY 6 HOURS AS NEEDED FOR WHEEZING ORSHORTNESS OF BREATH.  Marland Kitchen aspirin EC 81  MG tablet Take 81 mg by mouth daily.  . blood glucose meter kit and supplies Dispense based on patient and insurance preference.Requesting accuchek meter. Once daily testing dx e11.9  . busPIRone (BUSPAR) 7.5 MG tablet TAKE 1 TABLET BY MOUTH 3 TIMES DAILY.  Marland Kitchen glipiZIDE (GLIPIZIDE XL) 5 MG 24 hr tablet Take 1 tablet (5 mg total) by mouth daily with breakfast.  . glucose blood (ACCU-CHEK AVIVA PLUS) test strip USE TO TEST ONCE DAILY  . ipratropium-albuterol (DUONEB) 0.5-2.5 (3) MG/3ML SOLN USE 1 VIAL(3 ML) IN NEBULIZER EVERY 4 TO 6 HOURS  . olmesartan (BENICAR) 40 MG tablet TAKE 1 TABLET BY MOUTH ONCE A DAY.  . pravastatin (PRAVACHOL) 20 MG tablet TAKE (1) TABLET BY MOUTH AT BEDTIME FOR CHOLESTEROL.  Marland Kitchen risperiDONE (RISPERDAL) 1 MG tablet 1 tablet by mouth at bedtime (Patient taking differently: 1 tablet by mouth at bedtime with a 4 mg tablet to = a 5 mg  dose)  . risperidone (RISPERDAL) 4 MG tablet TAKE (1) TABLET BY MOUTH AT BEDTIME. (Patient taking differently: Takes with a 1 mg tablet at night to = 5 mg dose)  . SYMBICORT 80-4.5 MCG/ACT inhaler INHALE 2 PUFFS INTO LUNGS TWICE DAILY.  Marland Kitchen tiotropium (SPIRIVA) 18 MCG inhalation capsule Place 1 capsule (18 mcg total) into inhaler and inhale daily.  Marland Kitchen tiZANidine (ZANAFLEX) 2 MG tablet One tablet once daily as needed , for muscle spasm (Patient taking differently: Take 2 mg by mouth. One tablet once daily as needed , for muscle spasm Pt. Takes everyday.)  . traZODone (DESYREL) 50 MG tablet Take 1 tablet (  50 mg total) by mouth at bedtime as needed. for sleep  . triamterene-hydrochlorothiazide (MAXZIDE-25) 37.5-25 MG tablet Half tablet daily (Patient taking differently: Take 0.5 tablets by mouth daily. Half tablet daily)  . ACCU-CHEK SOFTCLIX LANCETS lancets TEST BLOOD SUGAR ONCE DAILY AS DIRECTED.  Marland Kitchen promethazine-dextromethorphan (PROMETHAZINE-DM) 6.25-15 MG/5ML syrup One teaspoon at bedtime , as needed, for excessive cough  . [DISCONTINUED] sildenafil  (VIAGRA) 100 MG tablet Take 100 mg by mouth. Take one tablet 30 mins before intercourse    No facility-administered encounter medications on file as of 02/20/2019.     Functional Status:   In your present state of health, do you have any difficulty performing the following activities: 02/20/2019 07/23/2018  Hearing? N N  Comment - -  Vision? N N  Difficulty concentrating or making decisions? N N  Walking or climbing stairs? Y N  Dressing or bathing? Y N  Comment Occassionally needs assistance. -  Doing errands, shopping? Y N  Preparing Food and eating ? Y -  Comment Needs occasional assistance. -  Using the Toilet? N -  In the past six months, have you accidently leaked urine? N -  Do you have problems with loss of bowel control? Y -  Managing your Medications? N -  Managing your Finances? N -  Housekeeping or managing your Housekeeping? Y -  Comment Needs occasional assistance. -  Some recent data might be hidden    Fall/Depression Screening:    Fall Risk  02/20/2019 01/20/2019 12/17/2018  Falls in the past year? 0 0 0  Injury with Fall? - 0 -  Risk for fall due to : Impaired balance/gait - -  Risk for fall due to: Comment - - -  Follow up Falls prevention discussed - -   PHQ 2/9 Scores 02/20/2019 02/11/2019 01/20/2019 12/17/2018 09/18/2018 07/08/2018 04/16/2018  PHQ - 2 Score 0 1 0 0 0 0 0  PHQ- 9 Score - - - - - - -  Exception Documentation - - - - - - -    Assessment:    Initial home visit complete. Matthew Molina reports "feeling good" since his last hospitalization. Reports occasional nonproductive cough. Denies complaints of pain, shortness of breath or chest discomfort. Reports not using assistive device but cane and walker are available if needed. No falls reports.  Medications reviewed. He reports taking medications as prescribed but does not check his blood glucose regularly. Reports discussing concerns regarding affordability with [facility] Pharmacist on last week. Denies further  concerns regarding medication cost. Pill box provided to assist with medication management and compliance. Blood glucose log provided. Glucometer and supplies located and placed with medications to assist with compliance.  Discussed care management needs. Reports requiring minimal assistance with ADLs due to decreased activity tolerance. Receives assistance from personal care aides seven days a week. Also states neighbors are available to assist if needed. Denies need for transportation assistance our services with community outreach center. Reports smoking frequently but declined referral for smoking cessation counseling. Denies urgent concerns. Agreeable to ongoing [facility] outreach.  THN CM Care Plan Problem One     Most Recent Value  Care Plan Problem One  Risk for Readmission  Role Documenting the Problem One  Care Management [place] for Problem One  Active  THN Long Term Goal   Patient will not be readmiited for chronic disease complications over the next 60 days.  THN Long Term Goal Start Date  02/20/19  Interventions for Problem One Long  Term Goal  Provided education regarding COPD zones, medications, blood glucose parameters and safety precautions. Provided [facility] Calendar.  THN CM Short Term Goal #1   Over the next 30 days patient will take medications as prescribed.  THN CM Short Term Goal #1 Start Date  02/20/19  Interventions for Short Term Goal #1  Reviewed medications and discussed indications for use. Pill box provided.  THN CM Short Term Goal #2   Over the next 30 days patient will monitor blood glucose daily and record readings.  THN CM Short Term Goal #2 Start Date  02/20/19  Interventions for Short Term Goal #2  Discussed importance of daily blood glucose monitoring. Discussed s/sx of hypoglycemia and hyperglycemia. Discussed indications for notifying MD.  THN CM Short Term Goal #3  Over the next 30 days patient will verbalize knowledge of home safety precautions.  THN  CM Short Term Goal #3 Start Date  02/20/19  Interventions for Short Tern Goal #3  Discussed importance of keeping pathways clear and having assistive device available when ambulating outside of his apartment.       PLAN Will follow up next week.   [place] [phone]

## 2019-02-25 ENCOUNTER — Other Ambulatory Visit: Payer: Self-pay | Admitting: Family Medicine

## 2019-02-28 ENCOUNTER — Other Ambulatory Visit: Payer: Self-pay

## 2019-02-28 NOTE — Patient Outreach (Signed)
[place] [facility]) Care Management  02/28/2019  [name] 1951-01-23 [id number]   Follow up outreach with [name]. Reports checking fasting blood glucose but not recording readings. States his last reading was "over 200" but denies symptoms of hyperglycemia.  Reports increased consumption of soda and fruit juice. Discussed options for low carb beverages. He was strongly encouraged to consistently monitor and record his fasting blood glucose levels. Agreeable to follow up next week with readings. PLAN Will follow up next week.   [place] [phone]

## 2019-03-02 DIAGNOSIS — J449 Chronic obstructive pulmonary disease, unspecified: Secondary | ICD-10-CM | POA: Diagnosis not present

## 2019-03-05 ENCOUNTER — Other Ambulatory Visit: Payer: Self-pay | Admitting: Family Medicine

## 2019-03-06 ENCOUNTER — Ambulatory Visit: Payer: Self-pay | Admitting: Family Medicine

## 2019-03-07 ENCOUNTER — Other Ambulatory Visit: Payer: Self-pay

## 2019-03-07 NOTE — Patient Outreach (Signed)
[facility]) Care Management  03/07/2019  [name] 02-23-1951 [id number]  Follow up outreach with [name] regarding fasting blood sugar levels. Reports a morning reading of 113 mg/dl and attempting to follow diet recommendations. Encouraged to continue monitoring daily and maintain log. PLAN Will continue routine outreach.  [employer] [phone]

## 2019-03-12 ENCOUNTER — Ambulatory Visit: Payer: Medicare Other | Admitting: Podiatry

## 2019-03-21 ENCOUNTER — Other Ambulatory Visit: Payer: Self-pay

## 2019-03-25 NOTE — Patient Outreach (Signed)
[place] [facility]) Care Management   Matthew Molina Dec 24, 1950 [id number]   Successful outreach with Matthew Molina. Reports feeling well. No complaints of discomfort or shortness of breath. Reports his COPD has been well controlled. States he is ambulating well and following safety and fall precautions. Discussed compliance with medications and blood glucose monitoring. He reports taking all medications as prescribed. Fasting blood sugar reading of 98 mg/dl. Reports compliance with daily monitoring and will attempt to consistently record readings. Discussed importance of following recommendations related to COVID-19 due to his high risk for developing severe complications. Mr. Streight denied immediate care management needs and agreed to notify RNCM if needed prior to next outreach.  PLAN Will continue routine outreach.   [place] [phone]

## 2019-03-31 ENCOUNTER — Other Ambulatory Visit: Payer: Self-pay

## 2019-03-31 NOTE — Patient Outreach (Signed)
[place] [facility]) Care Management  03/31/2019  Matthew Molina 1951-04-03 [id number]  Follow up outreach with [name] to discuss fasting blood glucose readings. Reports forgetting to check today but consistently checked readings over the past week. Reports a range of 110-119 mg/dl. Admits to eating several high carb meals over the weekend but attempting to comply with diet recommendations. Denies worsening symptoms since last outreach. No immediate care management needs.   PLAN Will continue routine outreach.    [place] [phone]

## 2019-04-01 ENCOUNTER — Telehealth: Payer: Self-pay | Admitting: *Deleted

## 2019-04-01 ENCOUNTER — Encounter: Payer: Self-pay | Admitting: Family Medicine

## 2019-04-01 ENCOUNTER — Other Ambulatory Visit: Payer: Self-pay

## 2019-04-01 ENCOUNTER — Ambulatory Visit (INDEPENDENT_AMBULATORY_CARE_PROVIDER_SITE_OTHER): Payer: Medicare Other | Admitting: Family Medicine

## 2019-04-01 VITALS — BP 129/80

## 2019-04-01 DIAGNOSIS — R059 Cough, unspecified: Secondary | ICD-10-CM

## 2019-04-01 DIAGNOSIS — R05 Cough: Secondary | ICD-10-CM

## 2019-04-01 DIAGNOSIS — J449 Chronic obstructive pulmonary disease, unspecified: Secondary | ICD-10-CM

## 2019-04-01 DIAGNOSIS — F209 Schizophrenia, unspecified: Secondary | ICD-10-CM | POA: Diagnosis not present

## 2019-04-01 DIAGNOSIS — E1169 Type 2 diabetes mellitus with other specified complication: Secondary | ICD-10-CM

## 2019-04-01 DIAGNOSIS — E669 Obesity, unspecified: Secondary | ICD-10-CM

## 2019-04-01 MED ORDER — ALBUTEROL SULFATE HFA 108 (90 BASE) MCG/ACT IN AERS: INHALATION_SPRAY | RESPIRATORY_TRACT | 5 refills | Status: DC

## 2019-04-01 MED ORDER — GLUCOSE BLOOD VI STRP: ORAL_STRIP | 5 refills | Status: DC

## 2019-04-01 MED ORDER — BUDESONIDE-FORMOTEROL FUMARATE 80-4.5 MCG/ACT IN AERO: INHALATION_SPRAY | RESPIRATORY_TRACT | 0 refills | Status: DC

## 2019-04-01 MED ORDER — RISPERIDONE 4 MG PO TABS: ORAL_TABLET | ORAL | 0 refills | Status: DC

## 2019-04-01 NOTE — Telephone Encounter (Signed)
Pt called needing a refill on his accucheck aviva plus test strips sent to [facility]

## 2019-04-01 NOTE — Progress Notes (Signed)
Virtual Visit via Telephone Note  I connected with Matthew Molina on 04/01/19 at  1:00 PM EDT by telephone and verified that I am speaking with the correct person using two identifiers.   I discussed the limitations, risks, security and privacy concerns of performing an evaluation and management service by telephone and the availability of in person appointments. I also discussed with the patient that there may be a patient responsible charge related to this service. The patient expressed understanding and agreed to proceed.  Location of Patient: Home Location of Provider: Telehealth Consent was obtain for visit to be over via telehealth.  History of Present Illness: Matthew Molina is a 68 year old male patient of Dr. Griffin Dakin, who reports today for a telemedicine visit secondary to having a non-productive cough for about a week now.  Reports that he is coughing constantly.  He denies any fever, chills, chest pain, shortness of breath (outside his baseline), chest tightness, wheezing.  When questioned about use of his medications he reports that he is taking them as directed.  When asked specifically how he is taking his inhalers he reports that he is taking his Symbicort daily, not using Spiriva, using his albuterol 2-3 times a day, occasional nebulizer.   Overall he feels okay, except for the cough that is started.  He does not think this is related to any allergies.   Past Medical, Surgical, Social History, Allergies, and Medications have been Reviewed.  Review of Systems  Constitutional: Negative for chills and fever.  HENT: Negative for congestion, sinus pain and sore throat.   Eyes: Negative for pain, discharge and redness.  Respiratory: Positive for cough. Negative for sputum production, shortness of breath and wheezing.   Cardiovascular: Negative for chest pain, palpitations and leg swelling.  Gastrointestinal: Negative.   Genitourinary: Negative.   Musculoskeletal: Negative.  Negative  for myalgias.  Neurological: Negative for dizziness and headaches.  Psychiatric/Behavioral: Negative.    Observations/Objective: Physical Exam Neurological:     Mental Status: He is alert and oriented to person, place, and time.  Psychiatric:        Attention and Perception: Attention normal.        Mood and Affect: Mood and affect normal.        Speech: Speech normal.        Behavior: Behavior normal. Behavior is cooperative.        Thought Content: Thought content normal.        Judgment: Judgment normal.     Comments: Communication is good today via the telephone visit.    Assessment and Plan:  1. Chronic obstructive pulmonary disease, unspecified COPD type ([place]) Patient has history of having COPD and is maintained on albuterol as needed, Symbicort twice daily, Spiriva once daily, nebulizer with ipratropium and albuterol as needed.  Reports that he has not been taking his Spiriva as ordered.  And unsure if he has been taking his Symbicort twice daily.  Versus once a day.  This could be the reason why he has started coughing in the setting of seasonal allergies.  Without other signs or symptoms I do not he warrants an antibiotic at this time.  Will refill his albuterol and Symbicort.  Did teach back with which inhalers to take when and how.  Patient verbalized which inhalers to take and when to take them. Reviewed side effects, risks and benefits of medication.    Additionally reviewed signs and symptoms of respiratory distress, fever, shortness of breath, tightness, wheezing anything that  is out of his baseline that he should be looking out for possibly need to go to the emergency room.  Patient acknowledged agreement and understanding of the plan.   - albuterol (PROAIR HFA) 108 (90 Base) MCG/ACT inhaler; INHALE 2 PUFFS INTO THE LUNGS EVERY 6 HOURS AS NEEDED FOR WHEEZING ORSHORTNESS OF BREATH.  Dispense: 8.5 g; Refill: 5 - budesonide-formoterol (SYMBICORT) 80-4.5 MCG/ACT inhaler; INHALE  2 PUFFS INTO LUNGS TWICE DAILY.  Dispense: 10.2 g; Refill: 0  2. Cough Uncontrolled, please see above not taking inhalers properly.  Asked him to take his inhalers as directed.  And if his cough did not improve or resolve that he could call the office back and let us know how he is doing.  3. Schizophrenia, unspecified type ([place]) Stable, needs refill.  - risperidone (RISPERDAL) 4 MG tablet; Takes with a 1 mg tablet at night to = 5 mg dose  Dispense: 90 tablet; Refill: 0   Follow Up Instructions: AVS printed and mailed   I discussed the assessment and treatment plan with the patient. The patient was provided an opportunity to ask questions and all were answered. The patient agreed with the plan and demonstrated an understanding of the instructions.   The patient was advised to call back or seek an in-person evaluation if the symptoms worsen or if the condition fails to improve as anticipated.  I provided 30 minutes of non-face-to-face time during this encounter.   Perlie Mayo, NP

## 2019-04-01 NOTE — Patient Instructions (Addendum)
    Thank you for completing your visit today via telemedicine.  I appreciate the opportunity to provide you with the care for your health and wellness. Today we discussed: Cough  Please Symbicort twice daily and Spiriva once daily you can refer back to the boxes they come in, so that you can always keep up with how and when to take them.  Albuterol can be used daily, but if you are using it several times a day every day that means you are not well controlled or it might mean that something else is going on and we need to look in to it.   Because you are having not been using the Spiriva like you should we do not know if that is what is causing your cough or if it something else. I want you to call back on Friday morning and tell me if you starting to feel better after using the inhalers has directed.  Please be aware of respiratory distress signs in the setting of the virus is going on right now.  If you develop a fever, shortness of breath, tightness in the chest, wheezing, that is out of your baseline and is not controlled by your inhalers please look to go to the nearest emergency room.   [place] YOUR HANDS WELL AND FREQUENTLY. AVOID TOUCHING YOUR FACE, UNLESS YOUR HANDS ARE FRESHLY WASHED.  GET FRESH AIR DAILY. STAY HYDRATED WITH WATER.   It was a pleasure to see you and I look forward to continuing to work together on your health and well-being. Please do not hesitate to call the office if you need care or have questions about your care.  Have a wonderful day and week. With Gratitude, Cherly Beach, DNP, AGNP-BC

## 2019-04-01 NOTE — Telephone Encounter (Signed)
Refill sent.

## 2019-04-02 DIAGNOSIS — J449 Chronic obstructive pulmonary disease, unspecified: Secondary | ICD-10-CM | POA: Diagnosis not present

## 2019-04-04 ENCOUNTER — Ambulatory Visit: Payer: Medicare Other | Admitting: Podiatry

## 2019-04-04 ENCOUNTER — Other Ambulatory Visit: Payer: Self-pay | Admitting: Family Medicine

## 2019-04-17 ENCOUNTER — Emergency Department (HOSPITAL_COMMUNITY): Payer: Medicare Other

## 2019-04-17 ENCOUNTER — Observation Stay (HOSPITAL_COMMUNITY)
Admission: EM | Admit: 2019-04-17 | Discharge: 2019-04-18 | Disposition: A | Discharge status: Home / Self Care | Payer: Medicare Other | Attending: Family Medicine | Admitting: Family Medicine

## 2019-04-17 ENCOUNTER — Ambulatory Visit: Payer: Medicare Other | Admitting: Family Medicine

## 2019-04-17 ENCOUNTER — Encounter: Payer: Self-pay | Admitting: Family Medicine

## 2019-04-17 ENCOUNTER — Encounter (HOSPITAL_COMMUNITY): Payer: Self-pay | Admitting: Emergency Medicine

## 2019-04-17 ENCOUNTER — Other Ambulatory Visit: Payer: Self-pay

## 2019-04-17 DIAGNOSIS — E1169 Type 2 diabetes mellitus with other specified complication: Secondary | ICD-10-CM | POA: Diagnosis not present

## 2019-04-17 DIAGNOSIS — N1832 Chronic kidney disease, stage 3b: Secondary | ICD-10-CM | POA: Diagnosis present

## 2019-04-17 DIAGNOSIS — E7800 Pure hypercholesterolemia, unspecified: Secondary | ICD-10-CM | POA: Diagnosis not present

## 2019-04-17 DIAGNOSIS — R05 Cough: Secondary | ICD-10-CM | POA: Diagnosis not present

## 2019-04-17 DIAGNOSIS — I2510 Atherosclerotic heart disease of native coronary artery without angina pectoris: Secondary | ICD-10-CM | POA: Diagnosis not present

## 2019-04-17 DIAGNOSIS — Z6830 Body mass index (BMI) 30.0-30.9, adult: Secondary | ICD-10-CM | POA: Insufficient documentation

## 2019-04-17 DIAGNOSIS — N183 Chronic kidney disease, stage 3 unspecified: Secondary | ICD-10-CM | POA: Diagnosis present

## 2019-04-17 DIAGNOSIS — E669 Obesity, unspecified: Secondary | ICD-10-CM | POA: Insufficient documentation

## 2019-04-17 DIAGNOSIS — G4700 Insomnia, unspecified: Secondary | ICD-10-CM | POA: Diagnosis not present

## 2019-04-17 DIAGNOSIS — I10 Essential (primary) hypertension: Secondary | ICD-10-CM

## 2019-04-17 DIAGNOSIS — Z9981 Dependence on supplemental oxygen: Secondary | ICD-10-CM | POA: Insufficient documentation

## 2019-04-17 DIAGNOSIS — Z7984 Long term (current) use of oral hypoglycemic drugs: Secondary | ICD-10-CM | POA: Insufficient documentation

## 2019-04-17 DIAGNOSIS — Z79899 Other long term (current) drug therapy: Secondary | ICD-10-CM | POA: Insufficient documentation

## 2019-04-17 DIAGNOSIS — J449 Chronic obstructive pulmonary disease, unspecified: Secondary | ICD-10-CM | POA: Diagnosis present

## 2019-04-17 DIAGNOSIS — R509 Fever, unspecified: Secondary | ICD-10-CM | POA: Diagnosis not present

## 2019-04-17 DIAGNOSIS — E1122 Type 2 diabetes mellitus with diabetic chronic kidney disease: Secondary | ICD-10-CM | POA: Insufficient documentation

## 2019-04-17 DIAGNOSIS — F17210 Nicotine dependence, cigarettes, uncomplicated: Secondary | ICD-10-CM | POA: Diagnosis not present

## 2019-04-17 DIAGNOSIS — Z20828 Contact with and (suspected) exposure to other viral communicable diseases: Secondary | ICD-10-CM | POA: Diagnosis not present

## 2019-04-17 DIAGNOSIS — E785 Hyperlipidemia, unspecified: Secondary | ICD-10-CM | POA: Insufficient documentation

## 2019-04-17 DIAGNOSIS — J441 Chronic obstructive pulmonary disease with (acute) exacerbation: Secondary | ICD-10-CM | POA: Insufficient documentation

## 2019-04-17 DIAGNOSIS — R000 Tachycardia, unspecified: Secondary | ICD-10-CM | POA: Diagnosis not present

## 2019-04-17 DIAGNOSIS — I129 Hypertensive chronic kidney disease with stage 1 through stage 4 chronic kidney disease, or unspecified chronic kidney disease: Secondary | ICD-10-CM | POA: Insufficient documentation

## 2019-04-17 DIAGNOSIS — F329 Major depressive disorder, single episode, unspecified: Secondary | ICD-10-CM | POA: Insufficient documentation

## 2019-04-17 DIAGNOSIS — F411 Generalized anxiety disorder: Secondary | ICD-10-CM | POA: Diagnosis not present

## 2019-04-17 DIAGNOSIS — J9621 Acute and chronic respiratory failure with hypoxia: Principal | ICD-10-CM | POA: Insufficient documentation

## 2019-04-17 DIAGNOSIS — Z7982 Long term (current) use of aspirin: Secondary | ICD-10-CM | POA: Insufficient documentation

## 2019-04-17 DIAGNOSIS — F209 Schizophrenia, unspecified: Secondary | ICD-10-CM | POA: Insufficient documentation

## 2019-04-17 DIAGNOSIS — R0602 Shortness of breath: Secondary | ICD-10-CM | POA: Diagnosis not present

## 2019-04-17 DIAGNOSIS — J42 Unspecified chronic bronchitis: Secondary | ICD-10-CM

## 2019-04-17 DIAGNOSIS — Z720 Tobacco use: Secondary | ICD-10-CM

## 2019-04-17 LAB — FIBRINOGEN: Fibrinogen: 502 mg/dL — ABNORMAL HIGH (ref 210–475)

## 2019-04-17 LAB — COMPREHENSIVE METABOLIC PANEL
ALT: 48 U/L — ABNORMAL HIGH (ref 0–44)
AST: 32 U/L (ref 15–41)
Albumin: 4.4 g/dL (ref 3.5–5.0)
Alkaline Phosphatase: 57 U/L (ref 38–126)
Anion gap: 12 (ref 5–15)
BUN: 9 mg/dL (ref 8–23)
CO2: 21 mmol/L — ABNORMAL LOW (ref 22–32)
Calcium: 10.1 mg/dL (ref 8.9–10.3)
Chloride: 99 mmol/L (ref 98–111)
Creatinine, Ser: 1.36 mg/dL — ABNORMAL HIGH (ref 0.61–1.24)
GFR calc Af Amer: >60 mL/min (ref >60)
GFR calc non Af Amer: 53 mL/min — ABNORMAL LOW (ref >60)
Glucose, Bld: 151 mg/dL — ABNORMAL HIGH (ref 70–99)
Potassium: 4.2 mmol/L (ref 3.5–5.1)
Sodium: 132 mmol/L — ABNORMAL LOW (ref 135–145)
Total Bilirubin: 0.8 mg/dL (ref 0.3–1.2)
Total Protein: 7.9 g/dL (ref 6.5–8.1)

## 2019-04-17 LAB — LACTIC ACID, PLASMA
Lactic Acid, Venous: 1.5 mmol/L (ref 0.5–1.9)
Lactic Acid, Venous: 1.7 mmol/L (ref 0.5–1.9)

## 2019-04-17 LAB — CBC WITH DIFFERENTIAL/PLATELET
Abs Immature Granulocytes: 0.02 10*3/uL (ref 0.00–0.07)
Basophils Absolute: 0.1 10*3/uL (ref 0.0–0.1)
Basophils Relative: 1 %
Eosinophils Absolute: 0.7 10*3/uL — ABNORMAL HIGH (ref 0.0–0.5)
Eosinophils Relative: 12 %
HCT: 41.4 % (ref 39.0–52.0)
Hemoglobin: 13.3 g/dL (ref 13.0–17.0)
Immature Granulocytes: 0 %
Lymphocytes Relative: 19 %
Lymphs Abs: 1.1 10*3/uL (ref 0.7–4.0)
MCH: 29.2 pg (ref 26.0–34.0)
MCHC: 32.1 g/dL (ref 30.0–36.0)
MCV: 91 fL (ref 80.0–100.0)
Monocytes Absolute: 0.3 10*3/uL (ref 0.1–1.0)
Monocytes Relative: 5 %
Neutro Abs: 3.5 10*3/uL (ref 1.7–7.7)
Neutrophils Relative %: 63 %
Platelets: 280 10*3/uL (ref 150–400)
RBC: 4.55 MIL/uL (ref 4.22–5.81)
RDW: 14.2 % (ref 11.5–15.5)
WBC: 5.7 10*3/uL (ref 4.0–10.5)
nRBC: 0 % (ref 0.0–0.2)

## 2019-04-17 LAB — PROCALCITONIN: Procalcitonin: <0.1 ng/mL

## 2019-04-17 LAB — TRIGLYCERIDES: Triglycerides: 88 mg/dL (ref <150)

## 2019-04-17 LAB — GLUCOSE, CAPILLARY
Glucose-Capillary: 177 mg/dL — ABNORMAL HIGH (ref 70–99)
Glucose-Capillary: 214 mg/dL — ABNORMAL HIGH (ref 70–99)
Glucose-Capillary: 191 mg/dL — ABNORMAL HIGH (ref 70–99)

## 2019-04-17 LAB — SARS CORONAVIRUS 2 BY RT PCR (HOSPITAL ORDER, PERFORMED IN ~~LOC~~ HOSPITAL LAB): SARS Coronavirus 2: NEGATIVE

## 2019-04-17 LAB — FERRITIN: Ferritin: 142 ng/mL (ref 24–336)

## 2019-04-17 LAB — LACTATE DEHYDROGENASE: LDH: 132 U/L (ref 98–192)

## 2019-04-17 LAB — C-REACTIVE PROTEIN: CRP: 1.2 mg/dL — ABNORMAL HIGH (ref <1.0)

## 2019-04-17 LAB — D-DIMER, QUANTITATIVE: D-Dimer, Quant: 0.55 ug/mL-FEU — ABNORMAL HIGH (ref 0.00–0.50)

## 2019-04-17 MED ORDER — SODIUM CHLORIDE 0.9 % IV SOLN: 250.0000 mL | INTRAVENOUS | Status: DC | PRN

## 2019-04-17 MED ORDER — RISPERIDONE 1 MG PO TABS
5.0000 mg | ORAL_TABLET | Freq: Every day | ORAL | Status: DC
  Administered 2019-04-17: 5 mg via ORAL
  Filled 2019-04-17: qty 5

## 2019-04-17 MED ORDER — IRBESARTAN 150 MG PO TABS
300.0000 mg | ORAL_TABLET | Freq: Every day | ORAL | Status: DC
  Administered 2019-04-17 – 2019-04-18 (×2): 300 mg via ORAL
  Filled 2019-04-17 (×2): qty 2

## 2019-04-17 MED ORDER — ASPIRIN EC 81 MG PO TBEC
81.0000 mg | DELAYED_RELEASE_TABLET | Freq: Every day | ORAL | Status: DC
  Administered 2019-04-17 – 2019-04-18 (×2): 81 mg via ORAL
  Filled 2019-04-17 (×2): qty 1

## 2019-04-17 MED ORDER — BUSPIRONE HCL 5 MG PO TABS
7.5000 mg | ORAL_TABLET | Freq: Three times a day (TID) | ORAL | Status: DC
  Administered 2019-04-17 – 2019-04-18 (×3): 7.5 mg via ORAL
  Filled 2019-04-17 (×3): qty 2

## 2019-04-17 MED ORDER — SODIUM CHLORIDE 0.9% FLUSH: 3.0000 mL | INTRAVENOUS | Status: DC | PRN

## 2019-04-17 MED ORDER — NICOTINE 21 MG/24HR TD PT24: 21.0000 mg | MEDICATED_PATCH | Freq: Every day | TRANSDERMAL | Status: DC | PRN

## 2019-04-17 MED ORDER — IPRATROPIUM-ALBUTEROL 0.5-2.5 (3) MG/3ML IN SOLN
3.0000 mL | RESPIRATORY_TRACT | Status: DC
  Administered 2019-04-17 – 2019-04-18 (×6): 3 mL via RESPIRATORY_TRACT
  Filled 2019-04-17 (×7): qty 3

## 2019-04-17 MED ORDER — DOXYCYCLINE HYCLATE 100 MG PO TABS
100.0000 mg | ORAL_TABLET | Freq: Two times a day (BID) | ORAL | Status: DC
  Administered 2019-04-17 – 2019-04-18 (×2): 100 mg via ORAL
  Filled 2019-04-17 (×2): qty 1

## 2019-04-17 MED ORDER — TRAZODONE HCL 50 MG PO TABS
50.0000 mg | ORAL_TABLET | Freq: Every evening | ORAL | Status: DC | PRN
  Administered 2019-04-18: 01:00:00 50 mg via ORAL
  Filled 2019-04-17: qty 1

## 2019-04-17 MED ORDER — ENOXAPARIN SODIUM 40 MG/0.4ML ~~LOC~~ SOLN
40.0000 mg | SUBCUTANEOUS | Status: DC
  Administered 2019-04-17: 40 mg via SUBCUTANEOUS
  Filled 2019-04-17: qty 0.4

## 2019-04-17 MED ORDER — INSULIN ASPART 100 UNIT/ML ~~LOC~~ SOLN
0.0000 [IU] | Freq: Three times a day (TID) | SUBCUTANEOUS | Status: DC
  Administered 2019-04-17 – 2019-04-18 (×2): 5 [IU] via SUBCUTANEOUS
  Administered 2019-04-18: 3 [IU] via SUBCUTANEOUS

## 2019-04-17 MED ORDER — PRAVASTATIN SODIUM 10 MG PO TABS
20.0000 mg | ORAL_TABLET | Freq: Every day | ORAL | Status: DC
  Administered 2019-04-17: 20 mg via ORAL
  Filled 2019-04-17: qty 2

## 2019-04-17 MED ORDER — POLYETHYLENE GLYCOL 3350 17 G PO PACK: 17.0000 g | PACK | Freq: Every day | ORAL | Status: DC | PRN

## 2019-04-17 MED ORDER — INSULIN ASPART 100 UNIT/ML ~~LOC~~ SOLN: 0.0000 [IU] | Freq: Every day | SUBCUTANEOUS | Status: DC

## 2019-04-17 MED ORDER — MOMETASONE FURO-FORMOTEROL FUM 100-5 MCG/ACT IN AERO
2.0000 | INHALATION_SPRAY | Freq: Two times a day (BID) | RESPIRATORY_TRACT | Status: DC
  Administered 2019-04-17 – 2019-04-18 (×2): 2 via RESPIRATORY_TRACT
  Filled 2019-04-17: qty 8.8

## 2019-04-17 MED ORDER — GUAIFENESIN ER 600 MG PO TB12
1200.0000 mg | ORAL_TABLET | Freq: Two times a day (BID) | ORAL | Status: DC
  Administered 2019-04-17 – 2019-04-18 (×3): 1200 mg via ORAL
  Filled 2019-04-17 (×3): qty 2

## 2019-04-17 MED ORDER — SODIUM CHLORIDE 0.9% FLUSH
3.0000 mL | Freq: Two times a day (BID) | INTRAVENOUS | Status: DC
  Administered 2019-04-17 – 2019-04-18 (×3): 3 mL via INTRAVENOUS

## 2019-04-17 MED ORDER — METHYLPREDNISOLONE SODIUM SUCC 125 MG IJ SOLR
60.0000 mg | Freq: Four times a day (QID) | INTRAMUSCULAR | Status: DC
  Administered 2019-04-17 – 2019-04-18 (×4): 60 mg via INTRAVENOUS
  Filled 2019-04-17 (×4): qty 2

## 2019-04-17 MED ORDER — INSULIN ASPART 100 UNIT/ML ~~LOC~~ SOLN
5.0000 [IU] | Freq: Three times a day (TID) | SUBCUTANEOUS | Status: DC
  Administered 2019-04-17 – 2019-04-18 (×3): 5 [IU] via SUBCUTANEOUS

## 2019-04-17 MED ORDER — METHYLPREDNISOLONE SODIUM SUCC 125 MG IJ SOLR
125.0000 mg | Freq: Once | INTRAMUSCULAR | Status: AC
  Administered 2019-04-17: 125 mg via INTRAVENOUS
  Filled 2019-04-17: qty 2

## 2019-04-17 MED ORDER — HYDROCOD POLST-CPM POLST ER 10-8 MG/5ML PO SUER
5.0000 mL | Freq: Two times a day (BID) | ORAL | Status: DC | PRN
  Administered 2019-04-18: 5 mL via ORAL
  Filled 2019-04-17 (×2): qty 5

## 2019-04-17 MED ORDER — BENZONATATE 100 MG PO CAPS
200.0000 mg | ORAL_CAPSULE | Freq: Once | ORAL | Status: AC
  Administered 2019-04-17: 200 mg via ORAL
  Filled 2019-04-17: qty 2

## 2019-04-17 MED ORDER — SODIUM CHLORIDE 0.9 % IV SOLN
1000.0000 mL | INTRAVENOUS | Status: DC
  Administered 2019-04-17: 1000 mL via INTRAVENOUS

## 2019-04-17 MED ORDER — ONDANSETRON HCL 4 MG PO TABS: 4.0000 mg | ORAL_TABLET | Freq: Four times a day (QID) | ORAL | Status: DC | PRN

## 2019-04-17 MED ORDER — ALBUTEROL SULFATE HFA 108 (90 BASE) MCG/ACT IN AERS
6.0000 | INHALATION_SPRAY | RESPIRATORY_TRACT | Status: AC
  Administered 2019-04-17 (×3): 6 via RESPIRATORY_TRACT
  Filled 2019-04-17: qty 6.7

## 2019-04-17 MED ORDER — ACETAMINOPHEN 650 MG RE SUPP: 650.0000 mg | Freq: Four times a day (QID) | RECTAL | Status: DC | PRN

## 2019-04-17 MED ORDER — ACETAMINOPHEN 325 MG PO TABS: 650.0000 mg | ORAL_TABLET | Freq: Four times a day (QID) | ORAL | Status: DC | PRN

## 2019-04-17 MED ORDER — HYDRALAZINE HCL 20 MG/ML IJ SOLN: 10.0000 mg | INTRAMUSCULAR | Status: DC | PRN

## 2019-04-17 MED ORDER — DOXYCYCLINE HYCLATE 100 MG PO TABS
100.0000 mg | ORAL_TABLET | Freq: Once | ORAL | Status: AC
  Administered 2019-04-17: 100 mg via ORAL
  Filled 2019-04-17: qty 1

## 2019-04-17 MED ORDER — ONDANSETRON HCL 4 MG/2ML IJ SOLN: 4.0000 mg | Freq: Four times a day (QID) | INTRAMUSCULAR | Status: DC | PRN

## 2019-04-17 MED ORDER — INSULIN GLARGINE 100 UNIT/ML ~~LOC~~ SOLN
12.0000 [IU] | Freq: Every day | SUBCUTANEOUS | Status: DC
  Administered 2019-04-17: 12 [IU] via SUBCUTANEOUS
  Filled 2019-04-17 (×4): qty 0.12

## 2019-04-17 NOTE — ED Triage Notes (Signed)
Pt from home.  Sob and cough x 2 days. Denies being around anyone sick.  Pt wears oxygen at bedtime. Requiring to wear oxygen throughout day

## 2019-04-17 NOTE — H&P (Addendum)
History and Physical  Matthew Molina HYW:[id number] DOB: 09-08-1951 DOA: 04/17/2019  Referring physician: Kem Boroughs MD  PCP: Fayrene Helper, MD   Chief Complaint: cough, SOB  HPI: Matthew Molina is a 68 y.o. male presented to the ED from home with progressive shortness of breath and cough.  He has been requiring more supplemental oxygen at home in the last 24 hours.  He has COPD and takes oxygen at home intermittently.  He reports that for the past month he has had intermittent coughing and congestion and shortness of breath.  He recently had a telemedicine meeting with primary care and there was some adjustments made to his bronchodilators.  He started to feel better but now has gotten worse with increasing wheezing coughing shortness of breath and sneezing.  He has had no known sick contacts and he has been sheltering in place.  He denies chest pain.  He denies fever and chills.  He denies diarrhea.  He was evaluated in the emergency department and had COVID-19 testing that was negative.  His chest x-ray did not show any acute pneumonias.  He was slightly hypoxic but this rapidly improved with supplemental oxygen and he has been weaned off oxygen at this time.  He continues to be tachypneic and tachycardic and hospital admission was requested for acute COPD exacerbation.  He received steroids and nebulizer treatments in the emergency department with some improvement however he still has not returned to baseline.  Review of Systems: All systems reviewed and apart from history of presenting illness, are negative.  Past Medical History:  Diagnosis Date  . Acute kidney injury ([place])   . Acute respiratory failure with hypoxia ([place])   . Arthritis   . COPD (chronic obstructive pulmonary disease) ([place])   . Depression   . Diabetes mellitus   . Diabetes mellitus without complication ([place])   . Hypercholesterolemia   . Hyperlipidemia   . Hypertension   . Multiple lung nodules on CT 04/02/2015  .  Nicotine addiction   . Obesity   . Oxygen deficiency    qhs  . Schizophrenia [facility])    Past Surgical History:  Procedure Laterality Date  . CATARACT EXTRACTION W/PHACO Left 11/20/2013   Procedure: CATARACT EXTRACTION PHACO AND INTRAOCULAR LENS PLACEMENT (IOC);  Surgeon: Tonny Branch, MD;  Location: [facility] ORS;  Service: Ophthalmology;  Laterality: Left;  CDE:10.26  . CATARACT EXTRACTION W/PHACO Right 12/08/2013   Procedure: RIGHT EYE CATARACT EXTRACTION PHACO AND INTRAOCULAR LENS PLACEMENT ;  Surgeon: Tonny Branch, MD;  Location: [facility] ORS;  Service: Ophthalmology;  Laterality: Right;  CDE 12.38  . COLONOSCOPY N/A 04/01/2013   Procedure: COLONOSCOPY;  Surgeon: Danie Binder, MD;  Location: [facility] END[facility] SUITE;  Service: Endoscopy;  Laterality: N/A;  10:00 AM-moved to [facility] notified pt  . COLONOSCOPY  2014   INCOMPLETE PREP IN R COLON  . ESOPHAGOGASTRODUODENOSCOPY N/A 02/18/2016   Procedure: ESOPHAGOGASTRODUODENOSCOPY (EGD);  Surgeon: Danie Binder, MD;  Location: [facility];  Service: Endoscopy;  Laterality: N/A;  1145  . EYE SURGERY Left 11/2013   cataract extraction  . SAVORY DILATION N/A 02/18/2016   Procedure: SAVORY DILATION;  Surgeon: Danie Binder, MD;  Location: [facility];  Service: Endoscopy;  Laterality: N/A;  . SHOULDER SURGERY     Social History:  reports that he has been smoking cigarettes. He has a 48.00 pack-year smoking history. He has never used smokeless tobacco. He reports current alcohol use. He reports that he does  not use drugs.  Allergies  Allergen Reactions  . Sertraline Hcl     Stomach upset/pain  . Wellbutrin [Bupropion] Other (See Comments)    Makes stomach hurt    Family History  Problem Relation Age of Onset  . Diabetes Mother   . Hypertension Mother   . Stroke Mother   . Diabetes Sister   . Heart disease Sister   . Kidney disease Father   . Drug abuse Brother   . Colon cancer Neg Hx   . Colon polyps Neg Hx     Prior to Admission medications    Medication Sig Start Date End Date Taking? Authorizing Provider  ACCU-CHEK SOFTCLIX LANCETS lancets TEST BLOOD SUGAR ONCE DAILY AS DIRECTED. 04/25/18  Yes Fayrene Helper, MD  acetaminophen (TYLENOL) 500 MG tablet Take 500-1,000 mg by mouth daily as needed. Takes mostly for HA about 2 x/week.   Yes [provider]  albuterol (PROAIR HFA) 108 (90 Base) MCG/ACT inhaler INHALE 2 PUFFS INTO THE LUNGS EVERY 6 HOURS AS NEEDED FOR WHEEZING ORSHORTNESS OF BREATH. 04/01/19  Yes Perlie Mayo, NP  aspirin EC 81 MG tablet Take 81 mg by mouth daily.   Yes [provider]  budesonide-formoterol (SYMBICORT) 80-4.5 MCG/ACT inhaler INHALE 2 PUFFS INTO LUNGS TWICE DAILY. 04/01/19  Yes Perlie Mayo, NP  busPIRone (BUSPAR) 7.5 MG tablet TAKE 1 TABLET BY MOUTH 3 TIMES DAILY. 04/04/19  Yes Fayrene Helper, MD  glipiZIDE (GLIPIZIDE XL) 5 MG 24 hr tablet Take 1 tablet (5 mg total) by mouth daily with breakfast. 12/17/18  Yes Fayrene Helper, MD  glucose blood (ACCU-CHEK AVIVA PLUS) test strip USE TO TEST ONCE DAILY 04/01/19  Yes Fayrene Helper, MD  ipratropium-albuterol (DUONEB) 0.5-2.5 (3) MG/3ML SOLN USE 1 VIAL(3 ML) IN NEBULIZER EVERY 4 TO 6 HOURS 01/29/19  Yes Fayrene Helper, MD  olmesartan (BENICAR) 40 MG tablet TAKE 1 TABLET BY MOUTH ONCE A DAY. 02/25/19  Yes Fayrene Helper, MD  pravastatin (PRAVACHOL) 20 MG tablet TAKE (1) TABLET BY MOUTH AT BEDTIME FOR CHOLESTEROL. 12/17/18  Yes Fayrene Helper, MD  risperiDONE (RISPERDAL) 1 MG tablet 1 tablet by mouth at bedtime Patient taking differently: 1 tablet by mouth at bedtime with a 4 mg tablet to = a 5 mg  dose 12/17/18  Yes Fayrene Helper, MD  risperidone (RISPERDAL) 4 MG tablet Takes with a 1 mg tablet at night to = 5 mg dose 04/01/19  Yes Perlie Mayo, NP  tiotropium (SPIRIVA) 18 MCG inhalation capsule Place 1 capsule (18 mcg total) into inhaler and inhale daily. 08/14/18  Yes Fayrene Helper, MD  tiZANidine  (ZANAFLEX) 2 MG tablet One tablet once daily as needed , for muscle spasm Patient taking differently: Take 2 mg by mouth. One tablet once daily as needed , for muscle spasm Pt. Takes everyday. 09/18/18  Yes Fayrene Helper, MD  traZODone (DESYREL) 50 MG tablet Take 1 tablet (50 mg total) by mouth at bedtime as needed. for sleep 12/17/18  Yes Fayrene Helper, MD  triamterene-hydrochlorothiazide (MAXZIDE-25) 37.5-25 MG tablet Half tablet daily Patient taking differently: Take 0.5 tablets by mouth daily. Half tablet daily 10/28/18  Yes Fayrene Helper, MD  sildenafil (VIAGRA) 100 MG tablet Take 100 mg by mouth. Take one tablet 30 mins before intercourse   03/11/12  [provider]   Physical Exam: Vitals:   04/17/19 1100 04/17/19 1130 04/17/19 1200 04/17/19 1230  BP: (!) 131/96 Marland Kitchen)  145/96 (!) 150/105 (!) 152/97  Pulse: (!) 106 (!) 114 (!) 106 (!) 103  Resp: (!) 24 (!) 25 (!) 27 (!) 29  Temp:      TempSrc:      SpO2: 100% 96% 96% 93%  Weight:      Height:         General exam: Moderately built and nourished patient, lying comfortably supine on the gurney in no obvious distress.  Head, eyes and ENT: Nontraumatic and normocephalic. Pupils equally reacting to light and accommodation. Oral mucosa dry.  Neck: Supple. No JVD, carotid bruit or thyromegaly.  Lymphatics: No lymphadenopathy.  Respiratory system: diffuse expiratory wheezing. Mild to moderate increased work of breathing.  Cardiovascular system: S1 and S2 heard, RRR. No JVD, murmurs, gallops, clicks or pedal edema.  Gastrointestinal system: Abdomen is nondistended, soft and nontender. Normal bowel sounds heard. No organomegaly or masses appreciated.  Central nervous system: Alert and oriented. No focal neurological deficits.  Extremities: Symmetric 5 x 5 power. Peripheral pulses symmetrically felt.   Skin: No rashes or acute findings.  Musculoskeletal system: Negative exam.  Psychiatry: Pleasant and  cooperative.  Labs on Admission:  Basic Metabolic Panel: Recent Labs  Lab 04/17/19 1100  NA 132*  K 4.2  CL 99  CO2 21*  GLUCOSE 151*  BUN 9  CREATININE 1.36*  CALCIUM 10.1   Liver Function Tests: Recent Labs  Lab 04/17/19 1100  AST 32  ALT 48*  ALKPHOS 57  BILITOT 0.8  PROT 7.9  ALBUMIN 4.4   No results for input(s): LIPASE, AMYLASE in the last 168 hours. No results for input(s): AMMONIA in the last 168 hours. CBC: Recent Labs  Lab 04/17/19 1100  WBC 5.7  NEUTROABS 3.5  HGB 13.3  HCT 41.4  MCV 91.0  PLT 280   Cardiac Enzymes: No results for input(s): CKTOTAL, CKMB, CKMBINDEX, TROPONINI in the last 168 hours.  BNP (last 3 results) No results for input(s): PROBNP in the last 8760 hours. CBG: No results for input(s): GLUCAP in the last 168 hours.  Radiological Exams on Admission: Dg Chest Port 1 View  Result Date: 04/17/2019 CLINICAL DATA:  Shortness of breath and cough. EXAM: PORTABLE CHEST 1 VIEW COMPARISON:  01/31/2019 FINDINGS: The heart size and mediastinal contours are within normal limits. Stable mild interstitial prominence suggestive of some degree of chronic lung disease. There is no evidence of pulmonary edema, consolidation, pneumothorax, nodule or pleural fluid. The visualized skeletal structures are unremarkable. IMPRESSION: Stable mild interstitial prominence suggestive of some degree of chronic lung disease. Electronically Signed   By: Aletta Edouard M.D.   On: 04/17/2019 10:00    EKG: Independently reviewed.  Sinus tachycardia   Assessment/Plan Principal Problem:   COPD with acute exacerbation (HCC) Active Problems:   Hyperlipidemia   Schizophrenia, unspecified type ([place])   Diabetes mellitus type 2 in obese (HCC)   GAD (generalized anxiety disorder)   Coronary atherosclerosis of native coronary artery   Tobacco abuse   COPD (chronic obstructive pulmonary disease) (HCC)   Hypertension   Insomnia   CKD (chronic kidney disease) stage  3, GFR 30-59 ml/min ([place])   1. Acute on chronic respiratory failure secondary to COPD exacerbation- the patient has failed outpatient management and requiring inpatient treatment.  The patient will be admitted and started on IV steroids, scheduled nebulizer treatments around-the-clock, supplemental oxygen as needed and bronchodilators.  Doxycycline will be continued that was started in the emergency department. 2. Type 2 diabetes mellitus-we will provide  basal and prandial insulin with sliding scale coverage for high blood glucose readings.  We do expect some hyperglycemia associated with IV steroid use and will monitor and adjust doses closely. 3. Stage III CKD-we will follow kidney function closely. 4. Schizophrenia-resume home behavioral health medications. 5. Insomnia- trazodone ordered as needed for symptoms. 6. Essential hypertension-planning to resume home blood pressure medications with the exception of the diuretics which we are holding in the setting of mild hyponatremia. 7. Tobacco abuse - Pt says he last smoked 4 days ago and he was counseled to avoid smoking in the future.  Have ordered a nicotine patch to use as needed for symptoms of nicotine withdrawal.   DVT Prophylaxis: Lovenox Code Status: Full Family Communication: Patient updated at bedside Disposition Plan: Home when medically stable in 1 to 2 days  Time spent: 54 minutes  Jasira Robinson Wynetta Emery, MD [facility] How to contact the [facility] Attending or Consulting provider [place] or covering provider during after hours [place], for this patient?  1. Check the care team in [facility] and look for a) attending/consulting [facility] provider listed and b) the [facility] team listed 2. Log into [email or web address] and use [place]'s universal password to access. If you do not have the password, please contact the hospital operator. 3. Locate the [facility] provider you are looking for under [facility] and page to a number that you can be directly  reached. 4. If you still have difficulty reaching the provider, please page the [facility] (Director on Call) for the Hospitalists listed on amion for assistance.

## 2019-04-17 NOTE — Patient Outreach (Signed)
[facility]) Care Management  04/17/2019  KINGSON LOHMEYER 1951/05/23 [id number]  Incoming call from [name]. States he feels "ok" but reports a worsening cough and shortness of breath. Reports symptoms are not relieved with breathing treatments. His neighbor was with him at the time of the call. Neighbor agreed to call EMS.  [place] [phone]

## 2019-04-17 NOTE — ED Provider Notes (Signed)
[facility] EMERGENCY DEPARTMENT Provider Note   CSN: [id number] Arrival date & time: 04/17/19  8299    History   Chief Complaint Chief Complaint  Patient presents with  . Shortness of Breath    HPI Matthew Molina is a 67 y.o. male.     HPI 68 year old male with history of COPD with chronic respiratory failure, diabetes, chronic kidney disease comes into the ER with chief complaint of shortness of breath.  Patient reports that over the past 2 days he has had worsening shortness of breath.  He typically uses oxygen upon exertion, however all day yesterday required oxygen even at rest.  Patient also has been having cough with clear phlegm.  He continues to smoke.  Patient has been having some wheezing, for which she has taken his rescue inhaler without long-lasting effects.  Patient denies any fevers, chills, nausea, vomiting, generalized weakness, URI symptoms.  He denies any known sick contacts.  Past Medical History:  Diagnosis Date  . Acute kidney injury ([place])   . Acute respiratory failure with hypoxia ([place])   . Arthritis   . COPD (chronic obstructive pulmonary disease) ([place])   . Depression   . Diabetes mellitus   . Diabetes mellitus without complication ([place])   . Hypercholesterolemia   . Hyperlipidemia   . Hypertension   . Multiple lung nodules on CT 04/02/2015  . Nicotine addiction   . Obesity   . Oxygen deficiency    qhs  . Schizophrenia [facility])     Patient Active Problem List   Diagnosis Date Noted  . COPD with acute exacerbation ([place]) 04/17/2019  . Boil 01/20/2019  . Unsteady gait 04/18/2017  . Elevated PSA 10/01/2016  . GERD (gastroesophageal reflux disease) 08/24/2016  . CKD (chronic kidney disease) stage 3, GFR 30-59 ml/min (HCC) 05/22/2016  . Insomnia 01/05/2016  . COPD (chronic obstructive pulmonary disease) ([place]) 11/05/2015  . Hypertension 11/05/2015  . Tobacco abuse 07/24/2015  . Coronary atherosclerosis of native coronary artery 04/02/2015  .  Multiple lung nodules on CT 04/02/2015  . GAD (generalized anxiety disorder) 07/04/2012  . Back pain with radiation 11/01/2011  . Diabetes mellitus type 2 in obese ([place]) 04/17/2011  . Hyperlipidemia 06/02/2008  . Obesity (BMI 30.0-34.9) 06/02/2008  . Schizophrenia, unspecified type ([place]) 06/02/2008    Past Surgical History:  Procedure Laterality Date  . CATARACT EXTRACTION W/PHACO Left 11/20/2013   Procedure: CATARACT EXTRACTION PHACO AND INTRAOCULAR LENS PLACEMENT (IOC);  Surgeon: Tonny Branch, MD;  Location: [facility] ORS;  Service: Ophthalmology;  Laterality: Left;  CDE:10.26  . CATARACT EXTRACTION W/PHACO Right 12/08/2013   Procedure: RIGHT EYE CATARACT EXTRACTION PHACO AND INTRAOCULAR LENS PLACEMENT ;  Surgeon: Tonny Branch, MD;  Location: [facility] ORS;  Service: Ophthalmology;  Laterality: Right;  CDE 12.38  . COLONOSCOPY N/A 04/01/2013   Procedure: COLONOSCOPY;  Surgeon: Danie Binder, MD;  Location: [facility] ENDO SUITE;  Service: Endoscopy;  Laterality: N/A;  10:00 AM-moved to [place] notified pt  . COLONOSCOPY  2014   INCOMPLETE PREP IN R COLON  . ESOPHAGOGASTRODUODENOSCOPY N/A 02/18/2016   Procedure: ESOPHAGOGASTRODUODENOSCOPY (EGD);  Surgeon: Danie Binder, MD;  Location: [facility] ENDO SUITE;  Service: Endoscopy;  Laterality: N/A;  1145  . EYE SURGERY Left 11/2013   cataract extraction  . SAVORY DILATION N/A 02/18/2016   Procedure: SAVORY DILATION;  Surgeon: Danie Binder, MD;  Location: [facility] ENDO SUITE;  Service: Endoscopy;  Laterality: N/A;  . SHOULDER SURGERY  Home Medications    Prior to Admission medications   Medication Sig Start Date End Date Taking? Authorizing Provider  ACCU-CHEK SOFTCLIX LANCETS lancets TEST BLOOD SUGAR ONCE DAILY AS DIRECTED. 04/25/18  Yes Fayrene Helper, MD  acetaminophen (TYLENOL) 500 MG tablet Take 500-1,000 mg by mouth daily as needed. Takes mostly for HA about 2 x/week.   Yes [provider]  albuterol (PROAIR HFA) 108 (90 Base) MCG/ACT inhaler  INHALE 2 PUFFS INTO THE LUNGS EVERY 6 HOURS AS NEEDED FOR WHEEZING ORSHORTNESS OF BREATH. 04/01/19  Yes Perlie Mayo, NP  aspirin EC 81 MG tablet Take 81 mg by mouth daily.   Yes [provider]  budesonide-formoterol (SYMBICORT) 80-4.5 MCG/ACT inhaler INHALE 2 PUFFS INTO LUNGS TWICE DAILY. 04/01/19  Yes Perlie Mayo, NP  busPIRone (BUSPAR) 7.5 MG tablet TAKE 1 TABLET BY MOUTH 3 TIMES DAILY. 04/04/19  Yes Fayrene Helper, MD  glipiZIDE (GLIPIZIDE XL) 5 MG 24 hr tablet Take 1 tablet (5 mg total) by mouth daily with breakfast. 12/17/18  Yes Fayrene Helper, MD  glucose blood (ACCU-CHEK AVIVA PLUS) test strip USE TO TEST ONCE DAILY 04/01/19  Yes Fayrene Helper, MD  ipratropium-albuterol (DUONEB) 0.5-2.5 (3) MG/3ML SOLN USE 1 VIAL(3 ML) IN NEBULIZER EVERY 4 TO 6 HOURS 01/29/19  Yes Fayrene Helper, MD  olmesartan (BENICAR) 40 MG tablet TAKE 1 TABLET BY MOUTH ONCE A DAY. 02/25/19  Yes Fayrene Helper, MD  pravastatin (PRAVACHOL) 20 MG tablet TAKE (1) TABLET BY MOUTH AT BEDTIME FOR CHOLESTEROL. 12/17/18  Yes Fayrene Helper, MD  risperiDONE (RISPERDAL) 1 MG tablet 1 tablet by mouth at bedtime Patient taking differently: 1 tablet by mouth at bedtime with a 4 mg tablet to = a 5 mg  dose 12/17/18  Yes Fayrene Helper, MD  risperidone (RISPERDAL) 4 MG tablet Takes with a 1 mg tablet at night to = 5 mg dose 04/01/19  Yes Perlie Mayo, NP  tiotropium (SPIRIVA) 18 MCG inhalation capsule Place 1 capsule (18 mcg total) into inhaler and inhale daily. 08/14/18  Yes Fayrene Helper, MD  tiZANidine (ZANAFLEX) 2 MG tablet One tablet once daily as needed , for muscle spasm Patient taking differently: Take 2 mg by mouth. One tablet once daily as needed , for muscle spasm Pt. Takes everyday. 09/18/18  Yes Fayrene Helper, MD  traZODone (DESYREL) 50 MG tablet Take 1 tablet (50 mg total) by mouth at bedtime as needed. for sleep 12/17/18  Yes Fayrene Helper, MD   triamterene-hydrochlorothiazide (MAXZIDE-25) 37.5-25 MG tablet Half tablet daily Patient taking differently: Take 0.5 tablets by mouth daily. Half tablet daily 10/28/18  Yes Fayrene Helper, MD  sildenafil (VIAGRA) 100 MG tablet Take 100 mg by mouth. Take one tablet 30 mins before intercourse   03/11/12  [provider]    Family History Family History  Problem Relation Age of Onset  . Diabetes Mother   . Hypertension Mother   . Stroke Mother   . Diabetes Sister   . Heart disease Sister   . Kidney disease Father   . Drug abuse Brother   . Colon cancer Neg Hx   . Colon polyps Neg Hx     Social History Social History   Tobacco Use  . Smoking status: Current Every Day Smoker    Packs/day: 1.00    Years: 48.00    Pack years: 48.00    Types: Cigarettes    Last attempt to  quit: 05/02/2017    Years since quitting: 1.9  . Smokeless tobacco: Never Used  Substance Use Topics  . Alcohol use: Yes    Alcohol/week: 0.0 standard drinks  . Drug use: No     Allergies   Sertraline hcl and Wellbutrin [bupropion]   Review of Systems Review of Systems  Constitutional: Positive for activity change.  HENT: Negative for rhinorrhea and sore throat.   Respiratory: Positive for cough and shortness of breath.   Cardiovascular: Negative for chest pain.  Gastrointestinal: Negative for nausea and vomiting.  Allergic/Immunologic: Negative for immunocompromised state.  All other systems reviewed and are negative.    Physical Exam Updated Vital Signs BP (!) 152/97   Pulse (!) 103   Temp 98.3 F (36.8 C) (Oral)   Resp (!) 29   Ht 6\' 1"  (1.854 m)   Wt 104.3 kg   SpO2 93%   BMI 30.34 kg/m   Physical Exam Vitals signs and nursing note reviewed.  Constitutional:      Appearance: He is well-developed.  HENT:     Head: Atraumatic.  Neck:     Musculoskeletal: Neck supple.     Vascular: No JVD.  Cardiovascular:     Rate and Rhythm: Tachycardia present.  Pulmonary:      Effort: Pulmonary effort is normal. Tachypnea present.     Breath sounds: Examination of the right-upper field reveals wheezing. Examination of the left-upper field reveals wheezing. Examination of the right-middle field reveals wheezing. Examination of the left-middle field reveals wheezing. Examination of the right-lower field reveals wheezing. Examination of the left-lower field reveals wheezing. Wheezing present. No decreased breath sounds, rhonchi or rales.  Musculoskeletal:     Right lower leg: No edema.     Left lower leg: No edema.  Skin:    General: Skin is warm.  Neurological:     Mental Status: He is alert and oriented to person, place, and time.      ED Treatments / Results  Labs (all labs ordered are listed, but only abnormal results are displayed) Labs Reviewed  CBC WITH DIFFERENTIAL/PLATELET - Abnormal; Notable for the following components:      Result Value   Eosinophils Absolute 0.7 (*)    All other components within normal limits  COMPREHENSIVE METABOLIC PANEL - Abnormal; Notable for the following components:   Sodium 132 (*)    CO2 21 (*)    Glucose, Bld 151 (*)    Creatinine, Ser 1.36 (*)    ALT 48 (*)    GFR calc non Af Amer 53 (*)    All other components within normal limits  D-DIMER, QUANTITATIVE (NOT AT [facility]) - Abnormal; Notable for the following components:   D-Dimer, Quant 0.55 (*)    All other components within normal limits  FIBRINOGEN - Abnormal; Notable for the following components:   Fibrinogen 502 (*)    All other components within normal limits  C-REACTIVE PROTEIN - Abnormal; Notable for the following components:   CRP 1.2 (*)    All other components within normal limits  SARS CORONAVIRUS 2 (HOSPITAL ORDER, [facility])  CULTURE, BLOOD (ROUTINE X 2)  CULTURE, BLOOD (ROUTINE X 2)  LACTIC ACID, PLASMA  PROCALCITONIN  LACTATE DEHYDROGENASE  FERRITIN  TRIGLYCERIDES  LACTIC ACID, PLASMA    EKG EKG  Interpretation  Date/Time:  Thursday April 17 2019 09:27:25 EDT Ventricular Rate:  112 PR Interval:    QRS Duration: 87 QT Interval:  317 QTC Calculation:  427 R Axis:   -57 Text Interpretation:  Sinus tachycardia Left anterior fascicular block Low voltage, precordial leads Nonspecific T abnrm, anterolateral leads No acute changes No significant change since last tracing Confirmed by Varney Biles ([id number]) on 04/17/2019 11:23:43 AM   Radiology Dg Chest Port 1 View  Result Date: 04/17/2019 CLINICAL DATA:  Shortness of breath and cough. EXAM: PORTABLE CHEST 1 VIEW COMPARISON:  01/31/2019 FINDINGS: The heart size and mediastinal contours are within normal limits. Stable mild interstitial prominence suggestive of some degree of chronic lung disease. There is no evidence of pulmonary edema, consolidation, pneumothorax, nodule or pleural fluid. The visualized skeletal structures are unremarkable. IMPRESSION: Stable mild interstitial prominence suggestive of some degree of chronic lung disease. Electronically Signed   By: Aletta Edouard M.D.   On: 04/17/2019 10:00    Procedures .Critical Care Performed by: Varney Biles, MD Authorized by: Varney Biles, MD   Critical care provider statement:    Critical care time (minutes):  35   Critical care was necessary to treat or prevent imminent or life-threatening deterioration of the following conditions:  Respiratory failure   Critical care was time spent personally by me on the following activities:  Discussions with consultants, evaluation of patient's response to treatment, examination of patient, ordering and performing treatments and interventions, ordering and review of laboratory studies, ordering and review of radiographic studies, pulse oximetry, re-evaluation of patient's condition, obtaining history from patient or surrogate and review of old charts   (including critical care time)  Medications Ordered in ED Medications  0.9 %   sodium chloride infusion (1,000 mLs Intravenous Transfusing/Transfer 04/17/19 1258)  methylPREDNISolone sodium succinate (SOLU-MEDROL) 125 mg/2 mL injection 125 mg (125 mg Intravenous Given 04/17/19 1045)  albuterol (VENTOLIN HFA) 108 (90 Base) MCG/ACT inhaler 6 puff (6 puffs Inhalation Given by Other 04/17/19 1257)  doxycycline (VIBRA-TABS) tablet 100 mg (100 mg Oral Given 04/17/19 1231)  benzonatate (TESSALON) capsule 200 mg (200 mg Oral Given 04/17/19 1231)     Initial Impression / Assessment and Plan / ED Course  I have reviewed the triage vital signs and the nursing notes.  Pertinent labs & imaging results that were available during my care of the patient were reviewed by me and considered in my medical decision making (see chart for details).  Clinical Course as of Apr 16 1299  Thu Apr 17, 2019  1255 Patient reassessed. His lung exam is improved.  He still having minor wheezing.  He is still tachypneic.  I have turned off his oxygen, his O2 sats have remained over 92%.  Patient's x-ray has no acute findings. Coronavirus test results are back and negative.  Patient is not comfortable going home.  At this time plan is to admit patient for COPD exacerbation.   [AN]    Clinical Course User Index [AN] Varney Biles, MD       68 year old male with history of COPD, CKD, diabetes comes in a chief complaint of shortness of breath.  He has been having 2 days of worsening shortness of breath and a productive cough.  History and review of system are not suggestive of any URI-like symptoms, fevers, chills, body aches.  Patient denies any sick contacts.  On exam he has diffuse wheezing.  Patient was given 6 puffs of albuterol inhaler and reassess.  Lung exam had improved at that time, but patient continued to be tachypneic and felt uncomfortable going home.  He was on 3 L of oxygen at arrival, we have slowly  wean him off of oxygen completely.  At home patient uses oxygen only when  needed.  Initial differential diagnosis included COPD exacerbation, pneumonia, pleural effusion, pulmonary edema, COVID-19 pneumonia.  Matthew Molina was evaluated in Emergency Department on 04/17/2019 for the symptoms described in the history of present illness. He was evaluated in the context of the global COVID-19 pandemic, which necessitated consideration that the patient might be at risk for infection with the SARS-CoV-2 virus that causes COVID-19. Institutional protocols and algorithms that pertain to the evaluation of patients at risk for COVID-19 are in a state of rapid change based on information released by regulatory bodies including the CDC and federal and state organizations. These policies and algorithms were followed during the patient's care in the ED.   Final Clinical Impressions(s) / ED Diagnoses   Final diagnoses:  COPD exacerbation [facility])    ED Discharge Orders    None       Varney Biles, MD 04/17/19 1300

## 2019-04-18 ENCOUNTER — Telehealth: Payer: Self-pay | Admitting: *Deleted

## 2019-04-18 DIAGNOSIS — E1169 Type 2 diabetes mellitus with other specified complication: Secondary | ICD-10-CM

## 2019-04-18 DIAGNOSIS — I2510 Atherosclerotic heart disease of native coronary artery without angina pectoris: Secondary | ICD-10-CM | POA: Diagnosis not present

## 2019-04-18 DIAGNOSIS — F5104 Psychophysiologic insomnia: Secondary | ICD-10-CM

## 2019-04-18 DIAGNOSIS — N183 Chronic kidney disease, stage 3 (moderate): Secondary | ICD-10-CM | POA: Diagnosis not present

## 2019-04-18 DIAGNOSIS — E669 Obesity, unspecified: Secondary | ICD-10-CM

## 2019-04-18 DIAGNOSIS — J9621 Acute and chronic respiratory failure with hypoxia: Secondary | ICD-10-CM | POA: Diagnosis not present

## 2019-04-18 DIAGNOSIS — J441 Chronic obstructive pulmonary disease with (acute) exacerbation: Secondary | ICD-10-CM | POA: Diagnosis not present

## 2019-04-18 LAB — BASIC METABOLIC PANEL
Anion gap: 14 (ref 5–15)
BUN: 18 mg/dL (ref 8–23)
CO2: 20 mmol/L — ABNORMAL LOW (ref 22–32)
Calcium: 9.8 mg/dL (ref 8.9–10.3)
Chloride: 92 mmol/L — ABNORMAL LOW (ref 98–111)
Creatinine, Ser: 1.39 mg/dL — ABNORMAL HIGH (ref 0.61–1.24)
GFR calc Af Amer: 60 mL/min — ABNORMAL LOW (ref >60)
GFR calc non Af Amer: 51 mL/min — ABNORMAL LOW (ref >60)
Glucose, Bld: 144 mg/dL — ABNORMAL HIGH (ref 70–99)
Potassium: 4 mmol/L (ref 3.5–5.1)
Sodium: 126 mmol/L — ABNORMAL LOW (ref 135–145)

## 2019-04-18 LAB — GLUCOSE, CAPILLARY
Glucose-Capillary: 161 mg/dL — ABNORMAL HIGH (ref 70–99)
Glucose-Capillary: 154 mg/dL — ABNORMAL HIGH (ref 70–99)
Glucose-Capillary: 208 mg/dL — ABNORMAL HIGH (ref 70–99)

## 2019-04-18 MED ORDER — DOXYCYCLINE HYCLATE 100 MG PO TABS: 100.0000 mg | ORAL_TABLET | Freq: Two times a day (BID) | ORAL | 0 refills | Status: AC

## 2019-04-18 MED ORDER — PREDNISONE 20 MG PO TABS: ORAL_TABLET | ORAL | 0 refills | Status: DC

## 2019-04-18 MED ORDER — SODIUM CHLORIDE 0.9 % IV BOLUS
500.0000 mL | Freq: Once | INTRAVENOUS | Status: AC
  Administered 2019-04-18: 09:00:00 500 mL via INTRAVENOUS

## 2019-04-18 MED ORDER — BENZONATATE 100 MG PO CAPS: 200.0000 mg | ORAL_CAPSULE | Freq: Two times a day (BID) | ORAL | Status: DC | PRN

## 2019-04-18 NOTE — Discharge Summary (Signed)
Physician Discharge Summary  Matthew Molina EXH:[id number] DOB: Mar 10, 1951 DOA: 04/17/2019  PCP: Fayrene Helper, MD  Admit date: 04/17/2019 Discharge date: 04/18/2019  Admitted From: Home Disposition: Home  Recommendations for Outpatient Follow-up:  1. Follow up with PCP in 1 weeks 2. Please obtain BMP/CBC in 1-2 week  Home Health: RN  Discharge Condition: Stable CODE STATUS: Full  Brief Hospitalization Summary: Please see all hospital notes, images, labs for full details of the hospitalization. HPI: Matthew Molina is a 68 y.o. male presented to the ED from home with progressive shortness of breath and cough.  He has been requiring more supplemental oxygen at home in the last 24 hours.  He has COPD and takes oxygen at home intermittently.  He reports that for the past month he has had intermittent coughing and congestion and shortness of breath.  He recently had a telemedicine meeting with primary care and there was some adjustments made to his bronchodilators.  He started to feel better but now has gotten worse with increasing wheezing coughing shortness of breath and sneezing.  He has had no known sick contacts and he has been sheltering in place.  He denies chest pain.  He denies fever and chills.  He denies diarrhea.  He was evaluated in the emergency department and had COVID-19 testing that was negative.  His chest x-ray did not show any acute pneumonias.  He was slightly hypoxic but this rapidly improved with supplemental oxygen and he has been weaned off oxygen at this time.  He continues to be tachypneic and tachycardic and hospital admission was requested for acute COPD exacerbation.  He received steroids and nebulizer treatments in the emergency department with some improvement however he still has not returned to baseline.  The patient was admitted with an acute COPD exacerbation.  He had been on outpatient management but he continued to have shortness of breath which led him to  come to the hospital.  He was given IV steroids scheduled nebs and started on doxycycline.  He says that he feels much better today and he wants to go home.  He does not want to stay in the hospital for more treatments.  He says that he will follow-up with his primary care provider.  I told him that if he goes home and he started smoking again he can definitely have an exacerbation and his shortness of breath would likely worsen and he could have to return to the hospital.  I asked him to please stop smoking.  Please stop using all tobacco products.  He verbalized understanding.  I asked him to discontinue the HCTZ diuretic that he was taking because I think that is causing him to have this hyponatremia.  I would like for him to follow-up with his primary care provider to have a BMP rechecked in 1 to 2 weeks.  He verbalized understanding.  The patient was given instructions to return if symptoms worsen or do not get better or new problems develop.  I would like for him to finish 3 more days of doxycycline.  Discharge Diagnoses:  Principal Problem:   COPD with acute exacerbation ([place]) Active Problems:   Hyperlipidemia   Schizophrenia, unspecified type ([place])   Diabetes mellitus type 2 in obese (HCC)   GAD (generalized anxiety disorder)   Coronary atherosclerosis of native coronary artery   Tobacco abuse   COPD (chronic obstructive pulmonary disease) (HCC)   Hypertension   Insomnia   CKD (chronic kidney disease) stage 3,  GFR 30-59 ml/min [facility])   Discharge Instructions: Discharge Instructions    Call MD for:  difficulty breathing, headache or visual disturbances   Complete by:  As directed    Call MD for:  extreme fatigue   Complete by:  As directed    Increase activity slowly   Complete by:  As directed      Allergies as of 04/18/2019      Reactions   Sertraline Hcl    Stomach upset/pain   Wellbutrin [bupropion] Other (See Comments)   Makes stomach hurt      Medication List    STOP  taking these medications   triamterene-hydrochlorothiazide 37.5-25 MG tablet Commonly known as:  MAXZIDE-25     TAKE these medications   Accu-Chek Softclix Lancets lancets TEST BLOOD SUGAR ONCE DAILY AS DIRECTED.   acetaminophen 500 MG tablet Commonly known as:  TYLENOL Take 500-1,000 mg by mouth daily as needed. Takes mostly for HA about 2 x/week.   albuterol 108 (90 Base) MCG/ACT inhaler Commonly known as:  ProAir HFA INHALE 2 PUFFS INTO THE LUNGS EVERY 6 HOURS AS NEEDED FOR WHEEZING ORSHORTNESS OF BREATH.   aspirin EC 81 MG tablet Take 81 mg by mouth daily.   budesonide-formoterol 80-4.5 MCG/ACT inhaler Commonly known as:  Symbicort INHALE 2 PUFFS INTO LUNGS TWICE DAILY.   busPIRone 7.5 MG tablet Commonly known as:  BUSPAR TAKE 1 TABLET BY MOUTH 3 TIMES DAILY.   doxycycline 100 MG tablet Commonly known as:  VIBRA-TABS Take 1 tablet (100 mg total) by mouth every 12 (twelve) hours for 3 days.   glipiZIDE 5 MG 24 hr tablet Commonly known as:  glipiZIDE XL Take 1 tablet (5 mg total) by mouth daily with breakfast.   glucose blood test strip Commonly known as:  Accu-Chek Aviva Plus USE TO TEST ONCE DAILY   ipratropium-albuterol 0.5-2.5 (3) MG/3ML Soln Commonly known as:  DUONEB USE 1 VIAL(3 ML) IN NEBULIZER EVERY 4 TO 6 HOURS   olmesartan 40 MG tablet Commonly known as:  BENICAR TAKE 1 TABLET BY MOUTH ONCE A DAY.   pravastatin 20 MG tablet Commonly known as:  PRAVACHOL TAKE (1) TABLET BY MOUTH AT BEDTIME FOR CHOLESTEROL.   predniSONE 20 MG tablet Commonly known as:  DELTASONE Take 3 PO QAM x3days, 2 PO QAM x3days, 1 PO QAM x3days   risperiDONE 1 MG tablet Commonly known as:  RISPERDAL 1 tablet by mouth at bedtime What changed:  additional instructions   risperidone 4 MG tablet Commonly known as:  RISPERDAL Takes with a 1 mg tablet at night to = 5 mg dose What changed:  Another medication with the same name was changed. Make sure you understand how and when  to take each.   tiotropium 18 MCG inhalation capsule Commonly known as:  SPIRIVA Place 1 capsule (18 mcg total) into inhaler and inhale daily.   tiZANidine 2 MG tablet Commonly known as:  ZANAFLEX One tablet once daily as needed , for muscle spasm What changed:    how much to take  how to take this  additional instructions   traZODone 50 MG tablet Commonly known as:  DESYREL Take 1 tablet (50 mg total) by mouth at bedtime as needed. for sleep      Follow-up Information    [facility] Follow up on 04/28/2019.   Why:  Please follow up  with [name] on Monday, May 11th at 10:40am.  Also someone from the office will contact you on  Monday, May 4th regarding your transition of care.  Contact information: [address] [place] [address] [phone]         Allergies  Allergen Reactions  . Sertraline Hcl     Stomach upset/pain  . Wellbutrin [Bupropion] Other (See Comments)    Makes stomach hurt   Allergies as of 04/18/2019      Reactions   Sertraline Hcl    Stomach upset/pain   Wellbutrin [bupropion] Other (See Comments)   Makes stomach hurt      Medication List    STOP taking these medications   triamterene-hydrochlorothiazide 37.5-25 MG tablet Commonly known as:  MAXZIDE-25     TAKE these medications   Accu-Chek Softclix Lancets lancets TEST BLOOD SUGAR ONCE DAILY AS DIRECTED.   acetaminophen 500 MG tablet Commonly known as:  TYLENOL Take 500-1,000 mg by mouth daily as needed. Takes mostly for HA about 2 x/week.   albuterol 108 (90 Base) MCG/ACT inhaler Commonly known as:  ProAir HFA INHALE 2 PUFFS INTO THE LUNGS EVERY 6 HOURS AS NEEDED FOR WHEEZING ORSHORTNESS OF BREATH.   aspirin EC 81 MG tablet Take 81 mg by mouth daily.   budesonide-formoterol 80-4.5 MCG/ACT inhaler Commonly known as:  Symbicort INHALE 2 PUFFS INTO LUNGS TWICE DAILY.   busPIRone 7.5 MG tablet Commonly known as:  BUSPAR TAKE 1  TABLET BY MOUTH 3 TIMES DAILY.   doxycycline 100 MG tablet Commonly known as:  VIBRA-TABS Take 1 tablet (100 mg total) by mouth every 12 (twelve) hours for 3 days.   glipiZIDE 5 MG 24 hr tablet Commonly known as:  glipiZIDE XL Take 1 tablet (5 mg total) by mouth daily with breakfast.   glucose blood test strip Commonly known as:  Accu-Chek Aviva Plus USE TO TEST ONCE DAILY   ipratropium-albuterol 0.5-2.5 (3) MG/3ML Soln Commonly known as:  DUONEB USE 1 VIAL(3 ML) IN NEBULIZER EVERY 4 TO 6 HOURS   olmesartan 40 MG tablet Commonly known as:  BENICAR TAKE 1 TABLET BY MOUTH ONCE A DAY.   pravastatin 20 MG tablet Commonly known as:  PRAVACHOL TAKE (1) TABLET BY MOUTH AT BEDTIME FOR CHOLESTEROL.   predniSONE 20 MG tablet Commonly known as:  DELTASONE Take 3 PO QAM x3days, 2 PO QAM x3days, 1 PO QAM x3days   risperiDONE 1 MG tablet Commonly known as:  RISPERDAL 1 tablet by mouth at bedtime What changed:  additional instructions   risperidone 4 MG tablet Commonly known as:  RISPERDAL Takes with a 1 mg tablet at night to = 5 mg dose What changed:  Another medication with the same name was changed. Make sure you understand how and when to take each.   tiotropium 18 MCG inhalation capsule Commonly known as:  SPIRIVA Place 1 capsule (18 mcg total) into inhaler and inhale daily.   tiZANidine 2 MG tablet Commonly known as:  ZANAFLEX One tablet once daily as needed , for muscle spasm What changed:    how much to take  how to take this  additional instructions   traZODone 50 MG tablet Commonly known as:  DESYREL Take 1 tablet (50 mg total) by mouth at bedtime as needed. for sleep       Procedures/Studies: Dg Chest Port 1 View  Result Date: 04/17/2019 CLINICAL DATA:  Shortness of breath and cough. EXAM: PORTABLE CHEST 1 VIEW COMPARISON:  01/31/2019 FINDINGS: The heart size and mediastinal contours are within normal limits. Stable mild interstitial prominence suggestive  of  some degree of chronic lung disease. There is no evidence of pulmonary edema, consolidation, pneumothorax, nodule or pleural fluid. The visualized skeletal structures are unremarkable. IMPRESSION: Stable mild interstitial prominence suggestive of some degree of chronic lung disease. Electronically Signed   By: Aletta Edouard M.D.   On: 04/17/2019 10:00      Subjective: Patient says he is feeling much better and he wants to go home.  He says that he could recover better at home.  He says that he is not intending to go home and smoke cigarettes.  Discharge Exam: Vitals:   04/18/19 0733 04/18/19 0738  BP:    Pulse:    Resp:    Temp:    SpO2: 95% 95%   Vitals:   04/18/19 0521 04/18/19 0545 04/18/19 0733 04/18/19 0738  BP:  128/78    Pulse:  98    Resp:      Temp:  97.7 F (36.5 C)    TempSrc:  Oral    SpO2: 95% 92% 95% 95%  Weight:      Height:        General: Pt is alert, awake, not in acute distress Cardiovascular: RRR, S1/S2 +, no rubs, no gallops Respiratory: Lungs are tight but much better air movement than yesterday and much less wheezing no wheezing, no rhonchi Abdominal: Soft, NT, ND, bowel sounds + Extremities: no edema, no cyanosis   The results of significant diagnostics from this hospitalization (including imaging, microbiology, ancillary and laboratory) are listed below for reference.     Microbiology: Recent Results (from the past 240 hour(s))  SARS Coronavirus 2 [facility] order, Performed in [facility] hospital lab)     Status: None   Collection Time: 04/17/19 11:00 AM  Result Value Ref Range Status   SARS Coronavirus 2 NEGATIVE NEGATIVE Final    Comment: (NOTE) If result is NEGATIVE SARS-CoV-2 target nucleic acids are NOT DETECTED. The SARS-CoV-2 RNA is generally detectable in upper and lower  respiratory specimens during the acute phase of infection. The lowest  concentration of SARS-CoV-2 viral copies this assay can detect is 250  copies / mL. A  negative result does not preclude SARS-CoV-2 infection  and should not be used as the sole basis for treatment or other  patient management decisions.  A negative result may occur with  improper specimen collection / handling, submission of specimen other  than nasopharyngeal swab, presence of viral mutation(s) within the  areas targeted by this assay, and inadequate number of viral copies  (<250 copies / mL). A negative result must be combined with clinical  observations, patient history, and epidemiological information. If result is POSITIVE SARS-CoV-2 target nucleic acids are DETECTED. The SARS-CoV-2 RNA is generally detectable in upper and lower  respiratory specimens dur ing the acute phase of infection.  Positive  results are indicative of active infection with SARS-CoV-2.  Clinical  correlation with patient history and other diagnostic information is  necessary to determine patient infection status.  Positive results do  not rule out bacterial infection or co-infection with other viruses. If result is PRESUMPTIVE POSTIVE SARS-CoV-2 nucleic acids MAY BE PRESENT.   A presumptive positive result was obtained on the submitted specimen  and confirmed on repeat testing.  While 2019 novel coronavirus  (SARS-CoV-2) nucleic acids may be present in the submitted sample  additional confirmatory testing may be necessary for epidemiological  and / or clinical management purposes  to differentiate between  SARS-CoV-2 and other Sarbecovirus currently known to infect  humans.  If clinically indicated additional testing with an alternate test  methodology [phone]) is advised. The SARS-CoV-2 RNA is generally  detectable in upper and lower respiratory sp ecimens during the acute  phase of infection. The expected result is Negative. Fact Sheet for Patients:  StrictlyIdeas.no Fact Sheet for Healthcare Providers: BankingDealers.co.za This test is not  yet approved or cleared by the [place] FDA and has been authorized for detection and/or diagnosis of SARS-CoV-2 by FDA under an Emergency Use Authorization (EUA).  This EUA will remain in effect (meaning this test can be used) for the duration of the COVID-19 declaration under Section 564(b)(1) of the Act, 21 U.S.C. section 360bbb-3(b)(1), unless the authorization is terminated or revoked sooner. Performed at [facility], [address], [name], [place] [address]   Blood Culture (routine x 2)     Status: None (Preliminary result)   Collection Time: 04/17/19 11:00 AM  Result Value Ref Range Status   Specimen Description RIGHT ANTECUBITAL  Final   Special Requests   Final    BOTTLES DRAWN AEROBIC AND ANAEROBIC Blood Culture adequate volume Performed at [facility], [address], [place], [place] [address]    Culture PENDING  Incomplete   Report Status PENDING  Incomplete  Blood Culture (routine x 2)     Status: None (Preliminary result)   Collection Time: 04/17/19 11:00 AM  Result Value Ref Range Status   Specimen Description BLOOD RIGHT ARM  Final   Special Requests   Final    BOTTLES DRAWN AEROBIC AND ANAEROBIC Blood Culture adequate volume Performed at [facility], [address], [place], [place] [address]    Culture PENDING  Incomplete   Report Status PENDING  Incomplete     Labs: BNP (last 3 results) Recent Labs    07/21/18 1617  BNP 69.4   Basic Metabolic Panel: Recent Labs  Lab 04/17/19 1100 04/18/19 0624  NA 132* 126*  K 4.2 4.0  CL 99 92*  CO2 21* 20*  GLUCOSE 151* 144*  BUN 9 18  CREATININE 1.36* 1.39*  CALCIUM 10.1 9.8   Liver Function Tests: Recent Labs  Lab 04/17/19 1100  AST 32  ALT 48*  ALKPHOS 57  BILITOT 0.8  PROT 7.9  ALBUMIN 4.4   No results for input(s): LIPASE, AMYLASE in the last 168 hours. No results for input(s): AMMONIA in the last 168 hours. CBC: Recent Labs  Lab 04/17/19 1100  WBC 5.7  NEUTROABS 3.5  HGB 13.3   HCT 41.4  MCV 91.0  PLT 280   Cardiac Enzymes: No results for input(s): CKTOTAL, CKMB, CKMBINDEX, TROPONINI in the last 168 hours. BNP: Invalid input(s): POCBNP CBG: Recent Labs  Lab 04/17/19 1404 04/17/19 1605 04/17/19 2132 04/18/19 0309 04/18/19 0735  GLUCAP 177* 214* 191* 161* 154*   D-Dimer Recent Labs    04/17/19 1100  DDIMER 0.55*   Hgb A1c No results for input(s): HGBA1C in the last 72 hours. Lipid Profile Recent Labs    04/17/19 1100  TRIG 88   Thyroid function studies No results for input(s): TSH, T4TOTAL, T3FREE, THYROIDAB in the last 72 hours.  Invalid input(s): FREET3 Anemia work up Recent Labs    04/17/19 1100  FERRITIN 142   Urinalysis    Component Value Date/Time   COLORURINE lt. yellow 02/25/2009 1257   APPEARANCEUR Clear 02/25/2009 1257   LABSPEC 1.010 02/25/2009 1257   PHURINE 6.0 02/25/2009 1257   HGBUR negative 02/25/2009 1257   BILIRUBINUR negative 02/25/2009 1257  UROBILINOGEN 0.2 02/25/2009 1257   NITRITE negative 02/25/2009 1257   Sepsis Labs Invalid input(s): PROCALCITONIN,  WBC,  LACTICIDVEN Microbiology Recent Results (from the past 240 hour(s))  SARS Coronavirus 2 [facility] order, Performed in [place] hospital lab)     Status: None   Collection Time: 04/17/19 11:00 AM  Result Value Ref Range Status   SARS Coronavirus 2 NEGATIVE NEGATIVE Final    Comment: (NOTE) If result is NEGATIVE SARS-CoV-2 target nucleic acids are NOT DETECTED. The SARS-CoV-2 RNA is generally detectable in upper and lower  respiratory specimens during the acute phase of infection. The lowest  concentration of SARS-CoV-2 viral copies this assay can detect is 250  copies / mL. A negative result does not preclude SARS-CoV-2 infection  and should not be used as the sole basis for treatment or other  patient management decisions.  A negative result may occur with  improper specimen collection / handling, submission of specimen other  than  nasopharyngeal swab, presence of viral mutation(s) within the  areas targeted by this assay, and inadequate number of viral copies  (<250 copies / mL). A negative result must be combined with clinical  observations, patient history, and epidemiological information. If result is POSITIVE SARS-CoV-2 target nucleic acids are DETECTED. The SARS-CoV-2 RNA is generally detectable in upper and lower  respiratory specimens dur ing the acute phase of infection.  Positive  results are indicative of active infection with SARS-CoV-2.  Clinical  correlation with patient history and other diagnostic information is  necessary to determine patient infection status.  Positive results do  not rule out bacterial infection or co-infection with other viruses. If result is PRESUMPTIVE POSTIVE SARS-CoV-2 nucleic acids MAY BE PRESENT.   A presumptive positive result was obtained on the submitted specimen  and confirmed on repeat testing.  While 2019 novel coronavirus  (SARS-CoV-2) nucleic acids may be present in the submitted sample  additional confirmatory testing may be necessary for epidemiological  and / or clinical management purposes  to differentiate between  SARS-CoV-2 and other Sarbecovirus currently known to infect humans.  If clinically indicated additional testing with an alternate test  methodology [phone]) is advised. The SARS-CoV-2 RNA is generally  detectable in upper and lower respiratory sp ecimens during the acute  phase of infection. The expected result is Negative. Fact Sheet for Patients:  StrictlyIdeas.no Fact Sheet for Healthcare Providers: BankingDealers.co.za This test is not yet approved or cleared by the [place] FDA and has been authorized for detection and/or diagnosis of SARS-CoV-2 by FDA under an Emergency Use Authorization (EUA).  This EUA will remain in effect (meaning this test can be used) for the duration of  the COVID-19 declaration under Section 564(b)(1) of the Act, 21 U.S.C. section 360bbb-3(b)(1), unless the authorization is terminated or revoked sooner. Performed at [facility], [address] [place],  [address]   Blood Culture (routine x 2)     Status: None (Preliminary result)   Collection Time: 04/17/19 11:00 AM  Result Value Ref Range Status   Specimen Description RIGHT ANTECUBITAL  Final   Special Requests   Final    BOTTLES DRAWN AEROBIC AND ANAEROBIC Blood Culture adequate volume Performed at [facility], [address] [place],  [address]    Culture PENDING  Incomplete   Report Status PENDING  Incomplete  Blood Culture (routine x 2)     Status: None (Preliminary result)   Collection Time: 04/17/19 11:00 AM  Result Value Ref Range Status  Specimen Description BLOOD RIGHT ARM  Final   Special Requests   Final    BOTTLES DRAWN AEROBIC AND ANAEROBIC Blood Culture adequate volume Performed at [facility], [address] [place], [place] [address]    Culture PENDING  Incomplete   Report Status PENDING  Incomplete    Time coordinating discharge:   SIGNED:  Irwin Brakeman, MD  [facility] 04/18/2019, 11:58 AM How to contact the [facility] Attending or Consulting provider [place] or covering provider during after hours [place], for this patient?  1. Check the care team in [facility] and look for a) attending/consulting [facility] provider listed and b) the [facility] team listed 2. Log into [email or web address] and use [place]'s universal password to access. If you do not have the password, please contact the hospital operator. 3. Locate the [facility] provider you are looking for under [facility] and page to a number that you can be directly reached. 4. If you still have difficulty reaching the provider, please page the [facility] (Director on Call) for the Hospitalists listed on amion for assistance.

## 2019-04-18 NOTE — Care Management Obs Status (Signed)
MEDICARE OBSERVATION STATUS NOTIFICATION   Patient Details  Name: Matthew Molina MRN: [id number] Date of Birth: [birth year]   Medicare Observation Status Notification Given:  Yes    Sherald Barge, RN 04/18/2019, 11:48 AM

## 2019-04-18 NOTE — TOC Transition Note (Signed)
Transition of Care [facility]) - CM/SW Discharge Note   Patient Details  Name: Matthew Molina MRN: [id number] Date of Birth: 04/04/1951  [facility] CM/SW Contact:  Sherald Barge, RN Phone Number: 04/18/2019, 10:27 AM   Clinical Narrative:   Admitted with COPD. Pt active with [facility]. Pt has home O2 for nighttime use and neb machine. PT recommends no f/u and per therapist pt did not desaturate on room air with ambulation. Pt voices no concerns about DC. Chart review revealed difficulty with inhalers (taking the correct ones at the correct times and knowing which inhalers he had already taken for the day) per note from NP. Pt feels he has been doing better with inhalers but is okay with [facility] nursing for further review. Pt has chosen [facility] for [facility] rep. PCP appointment moved up from may 18th. Will cont to follow with [facility].    Final next level of care: [place]     Patient Goals and CMS Choice Patient states their goals for this hospitalization and ongoing recovery are:: get my medications right CMS Medicare.gov Compare Post Acute Care list provided to:: Patient Choice offered to / list presented to : Patient  Discharge Plan and Services   Discharge Planning Services: CM Consult             HH Arranged: RN [facility] Agency: [facility] Date [place]: 04/18/19 Time [place]: 1013 Representative spoke with at [place]: [place]  Readmission Risk Interventions Readmission Risk Prevention Plan 04/18/2019  Transportation Screening Complete  PCP or Specialist Appt within 5-7 Days Complete  Home Care Screening Complete  Medication Review (RN CM) Complete  Some recent data might be hidden

## 2019-04-18 NOTE — Discharge Instructions (Signed)
IMPORTANT INFORMATION: PAY CLOSE ATTENTION   PHYSICIAN DISCHARGE INSTRUCTIONS  Follow with Primary care provider  Fayrene Helper, MD  and other consultants as instructed your Hospitalist Physician  SEEK MEDICAL CARE OR RETURN TO EMERGENCY ROOM IF SYMPTOMS COME BACK, WORSEN OR NEW PROBLEM DEVELOPS.   Please note: You were cared for by a hospitalist during your hospital stay. Every effort will be made to forward records to your primary care provider.  You can request that your primary care provider send for your hospital records if they have not received them.  Once you are discharged, your primary care physician will handle any further medical issues. Please note that NO REFILLS for any discharge medications will be authorized once you are discharged, as it is imperative that you return to your primary care physician (or establish a relationship with a primary care physician if you do not have one) for your post hospital discharge needs so that they can reassess your need for medications and monitor your lab values.  Please get a complete blood count and chemistry panel checked by your Primary MD at your next visit, and again as instructed by your Primary MD.  Get Medicines reviewed and adjusted: Please take all your medications with you for your next visit with your Primary MD  Laboratory/radiological data: Please request your Primary MD to go over all hospital tests and procedure/radiological results at the follow up, please ask your primary care provider to get all Hospital records sent to his/her office.  In some cases, they will be blood work, cultures and biopsy results pending at the time of your discharge. Please request that your primary care provider follow up on these results.  If you are diabetic, please bring your blood sugar readings with you to your follow up appointment with primary care.    Please call and make your follow up appointments as soon as possible.    Also  Note the following: If you experience worsening of your admission symptoms, develop shortness of breath, life threatening emergency, suicidal or homicidal thoughts you must seek medical attention immediately by calling 911 or calling your MD immediately  if symptoms less severe.  You must read complete instructions/literature along with all the possible adverse reactions/side effects for all the Medicines you take and that have been prescribed to you. Take any new Medicines after you have completely understood and accpet all the possible adverse reactions/side effects.   Do not drive when taking Pain medications or sleeping medications (Benzodiazepines)  Do not take more than prescribed Pain, Sleep and Anxiety Medications. It is not advisable to combine anxiety,sleep and pain medications without talking with your primary care practitioner  Special Instructions: If you have smoked or chewed Tobacco  in the last 2 yrs please stop smoking, stop any regular Alcohol  and or any Recreational drug use.  Wear Seat belts while driving.        Chronic Obstructive Pulmonary Disease Exacerbation  Chronic obstructive pulmonary disease (COPD) is a long-term (chronic) condition that affects the lungs. COPD is a general term that can be used to describe many different lung problems that cause lung swelling (inflammation) and limit airflow, including chronic bronchitis and emphysema. COPD exacerbations are episodes when breathing symptoms become much worse and require extra treatment. COPD exacerbations are usually caused by infections. Without treatment, COPD exacerbations can be severe and even life threatening. Frequent COPD exacerbations can cause further damage to the lungs. What are the causes? This condition may be caused  by:  Respiratory infections, including viral and bacterial infections.  Exposure to smoke.  Exposure to air pollution, chemical fumes, or dust.  Things that give you an  allergic reaction (allergens).  Not taking your usual COPD medicines as directed.  Underlying medical problems, such as congestive heart failure or infections not involving the lungs. In many cases, the cause (trigger) of this condition is not known. What increases the risk? The following factors may make you more likely to develop this condition:  Smoking cigarettes.  Old age.  Frequent prior COPD exacerbations. What are the signs or symptoms? Symptoms of this condition include:  Increased coughing.  Increased production of mucus from your lungs (sputum).  Increased wheezing.  Increased shortness of breath.  Rapid or labored breathing.  Chest tightness.  Less energy than usual.  Sleep disruption from symptoms.  Confusion or increased sleepiness. Often these symptoms happen or get worse even with the use of medicines. How is this diagnosed? This condition is diagnosed based on:  Your medical history.  A physical exam. You may also have tests, including:  A chest X-ray.  Blood tests.  Lung (pulmonary) function tests. How is this treated? Treatment for this condition depends on the severity and cause of the symptoms. You may need to be admitted to a hospital for treatment. Some of the treatments commonly used to treat COPD exacerbations are:  Antibiotic medicines. These may be used for severe exacerbations caused by a lung infection, such as pneumonia.  Bronchodilators. These are inhaled medicines that expand the air passages and allow increased airflow.  Steroid medicines. These act to reduce inflammation in the airways. They may be given with an inhaler, taken by mouth, or given through an IV tube inserted into one of your veins.  Supplemental oxygen therapy.  Airway clearing techniques, such as noninvasive ventilation (NIV) and positive expiratory pressure (PEP). These provide respiratory support through a mask or other noninvasive device. An example of  this would be using a continuous positive airway pressure (CPAP) machine to improve delivery of oxygen into your lungs. Follow these instructions at home: Medicines  Take over-the-counter and prescription medicines only as told by your health care provider. It is important to use correct technique with inhaled medicines.  If you were prescribed an antibiotic medicine or oral steroid, take it as told by your health care provider. Do not stop taking the medicine even if you start to feel better. Lifestyle  Eat a healthy diet.  Exercise regularly.  Get plenty of sleep.  Avoid exposure to all substances that irritate the airway, especially to tobacco smoke.  Wash your hands often with soap and water to reduce the risk of infection. If soap and water are not available, use hand sanitizer.  During flu season, avoid enclosed spaces that are crowded with people. General instructions  Drink enough fluid to keep your urine clear or pale yellow (unless you have a medical condition that requires fluid restriction).  Use a cool mist vaporizer. This humidifies the air and makes it easier for you to clear your chest when you cough.  If you have a home nebulizer and oxygen, continue to use them as told by your health care provider.  Keep all follow-up visits as told by your health care provider. This is important. How is this prevented?  Stay up-to-date on pneumococcal and influenza (flu) vaccines. A flu shot is recommended every year to help prevent exacerbations.  Do not use any products that contain nicotine or tobacco,  such as cigarettes and e-cigarettes. Quitting smoking is very important in preventing COPD from getting worse and in preventing exacerbations from happening as often. If you need help quitting, ask your health care provider.  Follow all instructions for pulmonary rehabilitation after a recent exacerbation. This can help prevent future exacerbations.  Work with your health care  provider to develop and follow an action plan. This tells you what steps to take when you experience certain symptoms. Contact a health care provider if:  You have a worsening of your regular COPD symptoms. Get help right away if:  You have worsening shortness of breath, even when resting.  You have trouble talking.  You have severe chest pain.  You cough up blood.  You have a fever.  You have weakness, vomit repeatedly, or faint.  You feel confused.  You are not able to sleep because of your symptoms.  You have trouble doing daily activities. Summary  COPD exacerbations are episodes when breathing symptoms become much worse and require extra treatment above your normal treatment.  Exacerbations can be severe and even life threatening. Frequent COPD exacerbations can cause further damage to your lungs.  COPD exacerbations are usually triggered by infections such as the flu, colds, and even pneumonia.  Treatment for this condition depends on the severity and cause of the symptoms. You may need to be admitted to a hospital for treatment.  Quitting smoking is very important to prevent COPD from getting worse and to prevent exacerbations from happening as often. This information is not intended to replace advice given to you by your health care provider. Make sure you discuss any questions you have with your health care provider. Document Released: 10/01/2007 Document Revised: 05/30/2017 Document Reviewed: 01/08/2017 Elsevier Interactive Patient Education  2019 [employer].

## 2019-04-18 NOTE — Evaluation (Signed)
Physical Therapy Evaluation Patient Details Name: Matthew Molina MRN: [id number] DOB: 18-Aug-1951 Today's Date: 04/18/2019   History of Present Illness  Matthew Molina is a 68 y.o. male presented to the ED from home with progressive shortness of breath and cough.  He has been requiring more supplemental oxygen at home in the last 24 hours.  He has COPD and takes oxygen at home intermittently.  He reports that for the past month he has had intermittent coughing and congestion and shortness of breath.  He recently had a telemedicine meeting with primary care and there was some adjustments made to his bronchodilators.  He started to feel better but now has gotten worse with increasing wheezing coughing shortness of breath and sneezing.  He has had no known sick contacts and he has been sheltering in place.  He denies chest pain.  He denies fever and chills.  He denies diarrhea.    Clinical Impression  Patient functioning near baseline for functional mobility and gait, no loss of balance during ambulation or deficits for functional mobility, did have frequent coughing which slowed speed of cadence.  Patient tolerated sitting up in chair and encouraged to ambulate in room as tolerated.  Plan:  Patient discharged from physical therapy to care of nursing for ambulation daily as tolerated for length of stay.    Follow Up Recommendations No PT follow up    Equipment Recommendations  None recommended by PT    Recommendations for Other Services       Precautions / Restrictions Precautions Precautions: None Restrictions Weight Bearing Restrictions: No      Mobility  Bed Mobility Overal bed mobility: Independent                Transfers Overall transfer level: Modified independent               General transfer comment: slightly increased time  Ambulation/Gait Ambulation/Gait assistance: Modified independent (Device/Increase time) Gait Distance (Feet): 100 Feet Assistive device:  Straight cane Gait Pattern/deviations: Step-through pattern;WFL(Within Functional Limits) Gait velocity: slightly decreased   General Gait Details: demonstrates good return for ambulation in room and hallways without loss of balance while on room air with SpO2 between 94-96%  Stairs            Wheelchair Mobility    Modified Rankin (Stroke Patients Only)       Balance Overall balance assessment: No apparent balance deficits (not formally assessed)                                           Pertinent Vitals/Pain Pain Assessment: No/denies pain    Home Living Family/patient expects to be discharged to:: Private residence Living Arrangements: Alone Available Help at Discharge: Neighbor;Personal care attendant Type of Home: Apartment Home Access: Elevator     Home Layout: Multi-level Home Equipment: [place] - single point;Walker - 2 wheels;Shower seat      Prior Function Level of Independence: Independent with assistive device(s)         Comments: Household and short distanced communty ambulator with SPC PRN, uses up to 3 LPM O2 PRN     Hand Dominance   Dominant Hand: Right    Extremity/Trunk Assessment   Upper Extremity Assessment Upper Extremity Assessment: Overall WFL for tasks assessed    Lower Extremity Assessment Lower Extremity Assessment: Overall WFL for tasks assessed  Cervical / Trunk Assessment Cervical / Trunk Assessment: Normal  Communication   Communication: No difficulties  Cognition Arousal/Alertness: Awake/alert Behavior During Therapy: WFL for tasks assessed/performed Overall Cognitive Status: Within Functional Limits for tasks assessed                                        General Comments      Exercises     Assessment/Plan    PT Assessment Patent does not need any further PT services  PT Problem List         PT Treatment Interventions      PT Goals (Current goals can be found in  the Care Plan section)  Acute Rehab PT Goals Patient Stated Goal: return home  PT Goal Formulation: With patient Time For Goal Achievement: 04/18/19 Potential to Achieve Goals: Good    Frequency     Barriers to discharge        Co-evaluation               AM-PAC PT "6 Clicks" Mobility  Outcome Measure Help needed turning from your back to your side while in a flat bed without using bedrails?: None Help needed moving from lying on your back to sitting on the side of a flat bed without using bedrails?: None Help needed moving to and from a bed to a chair (including a wheelchair)?: None Help needed standing up from a chair using your arms (e.g., wheelchair or bedside chair)?: None Help needed to walk in hospital room?: None Help needed climbing 3-5 steps with a railing? : None 6 Click Score: 24    End of Session   Activity Tolerance: Patient tolerated treatment well Patient left: in chair;with call bell/phone within reach Nurse Communication: Mobility status PT Visit Diagnosis: Unsteadiness on feet (R26.81);Other abnormalities of gait and mobility (R26.89);Muscle weakness (generalized) (M62.81)    Time: 1194-1740 PT Time Calculation (min) (ACUTE ONLY): 26 min   Charges:   PT Evaluation $PT Eval Moderate Complexity: 1 Mod PT Treatments $Therapeutic Activity: 23-37 mins        9:40 AM, 04/18/19 Lonell Grandchild, MPT Physical Therapist with [facility] [phone] office [phone] mobile phone

## 2019-04-18 NOTE — Plan of Care (Signed)
  Problem: Education: Goal: Knowledge of General Education information will improve Description Including pain rating scale, medication(s)/side effects and non-pharmacologic comfort measures Outcome: Adequate for Discharge   Problem: Health Behavior/Discharge Planning: Goal: Ability to manage health-related needs will improve Outcome: Progressing   Problem: Clinical Measurements: Goal: Ability to maintain clinical measurements within normal limits will improve Outcome: Adequate for Discharge   Problem: Activity: Goal: Risk for activity intolerance will decrease Outcome: Adequate for Discharge   Problem: Nutrition: Goal: Adequate nutrition will be maintained Outcome: Adequate for Discharge   Problem: Coping: Goal: Level of anxiety will decrease Outcome: Adequate for Discharge   Problem: Elimination: Goal: Will not experience complications related to bowel motility Outcome: Adequate for Discharge   Problem: Pain Managment: Goal: General experience of comfort will improve Outcome: Adequate for Discharge   Problem: Safety: Goal: Ability to remain free from injury will improve Outcome: Adequate for Discharge   Problem: Skin Integrity: Goal: Risk for impaired skin integrity will decrease Outcome: Adequate for Discharge

## 2019-04-18 NOTE — Care Management Important Message (Signed)
Important Message  Patient Details  Name: Matthew Molina MRN: [id number] Date of Birth: 03/03/1951   Medicare Important Message Given:  Yes    Tommy Medal 04/18/2019, 12:08 PM

## 2019-04-18 NOTE — Progress Notes (Signed)
Patient discharged home today per MD orders. Patient vital signs WDL. IV removed and site WDL. Discharge Instructions including follow up appointments, medications, and education reviewed with patient. Patient verbalizes understanding. Patient is transported out via wheelchair.  

## 2019-04-18 NOTE — Telephone Encounter (Signed)
Rn with [facility] called. Mr. Nuttall is being d/c today from hospital stay. Needs a TOC phone call. Has a TOC appt May 11 in office at 10:40

## 2019-04-18 NOTE — TOC Transition Note (Deleted)
Transition of Care [facility]) - CM/SW Discharge Note   Patient Details  Name: Matthew Molina MRN: [id number] Date of Birth: [birth year]  Transition of Care [facility]) CM/SW Contact:  Sherald Barge, RN Phone Number: 04/18/2019, 9:55 AM   Clinical Narrative:   Admitted with COPD. Pt active with [facility]. Pt has home O2 and neb machine. Pt voices no concerns about DC. No DC needs identified. PCP appointment moved up from may 18th. Will cont to follow with [facility].    Expected Discharge Plan: Home/Self Care    Expected Discharge Plan and Services Expected Discharge Plan: Home/Self Care   Discharge Planning Services: CM Consult   Living arrangements for the past 2 months: Apartment Expected Discharge Date: 04/18/19                   Prior Living Arrangements/Services Living arrangements for the past 2 months: Apartment Lives with:: Self Patient language and need for interpreter reviewed:: Yes Do you feel safe going back to the place where you live?: Yes      Need for Family Participation in Patient Care: No (Comment) Care giver support system in place?: Yes (comment) Current home services: DME Criminal Activity/Legal Involvement Pertinent to Current Situation/Hospitalization: No - Comment as needed  Activities of Daily Living Home Assistive Devices/Equipment: Eyeglasses, Oxygen, Other (Comment)(Nurses aide) ADL Screening (condition at time of admission) Patient's cognitive ability adequate to safely complete daily activities?: Yes Is the patient deaf or have difficulty hearing?: Yes Does the patient have difficulty seeing, even when wearing glasses/contacts?: Yes Does the patient have difficulty concentrating, remembering, or making decisions?: No Patient able to express need for assistance with ADLs?: Yes Does the patient have difficulty dressing or bathing?: Yes Independently performs ADLs?: No Communication: Independent Dressing (OT): Dependent Is this a change from baseline?:  Pre-admission baseline Grooming: Dependent Is this a change from baseline?: Pre-admission baseline Feeding: Independent Bathing: Dependent Is this a change from baseline?: Pre-admission baseline Toileting: Independent In/Out Bed: Independent Walks in Home: Independent Does the patient have difficulty walking or climbing stairs?: Yes Weakness of Legs: Both Weakness of Arms/Hands: Both  Emotional Assessment Appearance:: Appears older than stated age Attitude/Demeanor/Rapport: (appropriate) Affect (typically observed): Calm, Pleasant, Appropriate Orientation: : Oriented to Self, Oriented to  Time, Oriented to Place, Oriented to Situation      Admission diagnosis:  COPD exacerbation ([place]) [J44.1] Patient Active Problem List   Diagnosis Date Noted  . COPD with acute exacerbation ([place]) 04/17/2019  . Boil 01/20/2019  . Unsteady gait 04/18/2017  . Elevated PSA 10/01/2016  . GERD (gastroesophageal reflux disease) 08/24/2016  . CKD (chronic kidney disease) stage 3, GFR 30-59 ml/min ([facility]) 05/22/2016  . Insomnia 01/05/2016  . COPD (chronic obstructive pulmonary disease) ([place]) 11/05/2015  . Hypertension 11/05/2015  . Tobacco abuse 07/24/2015  . Coronary atherosclerosis of native coronary artery 04/02/2015  . Multiple lung nodules on CT 04/02/2015  . GAD (generalized anxiety disorder) 07/04/2012  . Back pain with radiation 11/01/2011  . Diabetes mellitus type 2 in obese ([place]) 04/17/2011  . Hyperlipidemia 06/02/2008  . Obesity (BMI 30.0-34.9) 06/02/2008  . Schizophrenia, unspecified type ([place]) 06/02/2008   PCP:  Fayrene Helper, MD Pharmacy:   [facility], [place]  [address] Phone: [phone] Fax: [place], [place] [address] AT [place] [place] [address]  Phone: [phone] Fax: [phone]    Readmission Risk  Interventions Readmission Risk Prevention Plan 04/18/2019  Transportation Screening Complete  PCP or Specialist Appt within 5-7 Days Complete  Home Care Screening Complete  Medication Review (RN CM) Complete  Some recent data might be hidden

## 2019-04-18 NOTE — Care Management CC44 (Signed)
Condition Code 44 Documentation Completed  Patient Details  Name: Matthew Molina MRN: [id number] Date of Birth: [birth year]   Condition Code 44 given:  Yes Patient signature on Condition Code 44 notice:  Yes Documentation of 2 MD's agreement:  Yes Code 44 added to claim:  Yes    Sherald Barge, RN 04/18/2019, 11:48 AM

## 2019-04-20 DIAGNOSIS — Z7984 Long term (current) use of oral hypoglycemic drugs: Secondary | ICD-10-CM | POA: Diagnosis not present

## 2019-04-20 DIAGNOSIS — J441 Chronic obstructive pulmonary disease with (acute) exacerbation: Secondary | ICD-10-CM | POA: Diagnosis not present

## 2019-04-20 DIAGNOSIS — G4700 Insomnia, unspecified: Secondary | ICD-10-CM | POA: Diagnosis not present

## 2019-04-20 DIAGNOSIS — J9620 Acute and chronic respiratory failure, unspecified whether with hypoxia or hypercapnia: Secondary | ICD-10-CM | POA: Diagnosis not present

## 2019-04-20 DIAGNOSIS — E785 Hyperlipidemia, unspecified: Secondary | ICD-10-CM | POA: Diagnosis not present

## 2019-04-20 DIAGNOSIS — E1122 Type 2 diabetes mellitus with diabetic chronic kidney disease: Secondary | ICD-10-CM | POA: Diagnosis not present

## 2019-04-20 DIAGNOSIS — N183 Chronic kidney disease, stage 3 (moderate): Secondary | ICD-10-CM | POA: Diagnosis not present

## 2019-04-20 DIAGNOSIS — I2510 Atherosclerotic heart disease of native coronary artery without angina pectoris: Secondary | ICD-10-CM | POA: Diagnosis not present

## 2019-04-20 DIAGNOSIS — Z9181 History of falling: Secondary | ICD-10-CM | POA: Diagnosis not present

## 2019-04-20 DIAGNOSIS — Z7982 Long term (current) use of aspirin: Secondary | ICD-10-CM | POA: Diagnosis not present

## 2019-04-20 DIAGNOSIS — I129 Hypertensive chronic kidney disease with stage 1 through stage 4 chronic kidney disease, or unspecified chronic kidney disease: Secondary | ICD-10-CM | POA: Diagnosis not present

## 2019-04-20 NOTE — Progress Notes (Signed)
Not seen as hospitalized

## 2019-04-21 ENCOUNTER — Telehealth: Payer: Self-pay

## 2019-04-21 NOTE — Telephone Encounter (Signed)
Transition Care Management Follow-up Telephone Call   Date discharged?  04/18/2019              How have you been since you were released from the hospital? pretty good   Do you understand why you were in the hospital? Copd exacerbation and coughing    Do you understand the discharge instructions? Some so we reviewed them again. He now understands them better   Where were you discharged to? home   Items Reviewed:  Medications reviewed:yes. He now knows what medication he needs to stop   Allergies reviewed: yes  Dietary changes reviewed: yes  Referrals reviewed: no new referrals   Functional Questionnaire:   Activities of Daily Living (ADLs):  yes    Any transportation issues/concerns?: rides the [place]   Any patient concerns? no   Confirmed importance and date/time of follow-up visits scheduled yes with verbal understanding     Confirmed with patient if condition begins to worsen call PCP or go to the ER.  Patient was given the office number and encouraged to call back with question or concerns. yes with verbal understanding

## 2019-04-21 NOTE — Telephone Encounter (Signed)
TOC call completed and routed to provider

## 2019-04-22 LAB — CULTURE, BLOOD (ROUTINE X 2)
Culture: NO GROWTH
Culture: NO GROWTH
Special Requests: ADEQUATE
Special Requests: ADEQUATE

## 2019-04-23 ENCOUNTER — Other Ambulatory Visit: Payer: Self-pay

## 2019-04-23 NOTE — Patient Outreach (Signed)
[facility]) Care Management  04/23/2019  Matthew Molina July 04, 1951 [id number]   Unsuccessful outreach attempt. Received voice prompt but unable to leave a voice message.   PLAN Will attempt outreach within 3-4 business days.   [place] [phone]

## 2019-04-24 DIAGNOSIS — N183 Chronic kidney disease, stage 3 (moderate): Secondary | ICD-10-CM | POA: Diagnosis not present

## 2019-04-24 DIAGNOSIS — Z7984 Long term (current) use of oral hypoglycemic drugs: Secondary | ICD-10-CM | POA: Diagnosis not present

## 2019-04-24 DIAGNOSIS — G4700 Insomnia, unspecified: Secondary | ICD-10-CM | POA: Diagnosis not present

## 2019-04-24 DIAGNOSIS — E785 Hyperlipidemia, unspecified: Secondary | ICD-10-CM | POA: Diagnosis not present

## 2019-04-24 DIAGNOSIS — J9620 Acute and chronic respiratory failure, unspecified whether with hypoxia or hypercapnia: Secondary | ICD-10-CM | POA: Diagnosis not present

## 2019-04-24 DIAGNOSIS — J441 Chronic obstructive pulmonary disease with (acute) exacerbation: Secondary | ICD-10-CM | POA: Diagnosis not present

## 2019-04-24 DIAGNOSIS — Z7982 Long term (current) use of aspirin: Secondary | ICD-10-CM | POA: Diagnosis not present

## 2019-04-24 DIAGNOSIS — I129 Hypertensive chronic kidney disease with stage 1 through stage 4 chronic kidney disease, or unspecified chronic kidney disease: Secondary | ICD-10-CM | POA: Diagnosis not present

## 2019-04-24 DIAGNOSIS — I2510 Atherosclerotic heart disease of native coronary artery without angina pectoris: Secondary | ICD-10-CM | POA: Diagnosis not present

## 2019-04-24 DIAGNOSIS — Z9181 History of falling: Secondary | ICD-10-CM | POA: Diagnosis not present

## 2019-04-24 DIAGNOSIS — E1122 Type 2 diabetes mellitus with diabetic chronic kidney disease: Secondary | ICD-10-CM | POA: Diagnosis not present

## 2019-04-25 DIAGNOSIS — G4700 Insomnia, unspecified: Secondary | ICD-10-CM | POA: Diagnosis not present

## 2019-04-25 DIAGNOSIS — I2510 Atherosclerotic heart disease of native coronary artery without angina pectoris: Secondary | ICD-10-CM | POA: Diagnosis not present

## 2019-04-25 DIAGNOSIS — E785 Hyperlipidemia, unspecified: Secondary | ICD-10-CM | POA: Diagnosis not present

## 2019-04-25 DIAGNOSIS — J9620 Acute and chronic respiratory failure, unspecified whether with hypoxia or hypercapnia: Secondary | ICD-10-CM | POA: Diagnosis not present

## 2019-04-25 DIAGNOSIS — Z9181 History of falling: Secondary | ICD-10-CM | POA: Diagnosis not present

## 2019-04-25 DIAGNOSIS — Z7984 Long term (current) use of oral hypoglycemic drugs: Secondary | ICD-10-CM | POA: Diagnosis not present

## 2019-04-25 DIAGNOSIS — E1122 Type 2 diabetes mellitus with diabetic chronic kidney disease: Secondary | ICD-10-CM | POA: Diagnosis not present

## 2019-04-25 DIAGNOSIS — Z7982 Long term (current) use of aspirin: Secondary | ICD-10-CM | POA: Diagnosis not present

## 2019-04-25 DIAGNOSIS — I129 Hypertensive chronic kidney disease with stage 1 through stage 4 chronic kidney disease, or unspecified chronic kidney disease: Secondary | ICD-10-CM | POA: Diagnosis not present

## 2019-04-25 DIAGNOSIS — J441 Chronic obstructive pulmonary disease with (acute) exacerbation: Secondary | ICD-10-CM | POA: Diagnosis not present

## 2019-04-25 DIAGNOSIS — N183 Chronic kidney disease, stage 3 (moderate): Secondary | ICD-10-CM | POA: Diagnosis not present

## 2019-04-28 ENCOUNTER — Telehealth: Payer: Self-pay | Admitting: Family Medicine

## 2019-04-28 ENCOUNTER — Encounter: Payer: Self-pay | Admitting: Family Medicine

## 2019-04-28 ENCOUNTER — Other Ambulatory Visit: Payer: Self-pay

## 2019-04-28 ENCOUNTER — Ambulatory Visit (INDEPENDENT_AMBULATORY_CARE_PROVIDER_SITE_OTHER): Payer: Medicare Other | Admitting: Family Medicine

## 2019-04-28 VITALS — BP 132/64 | HR 95 | Resp 14 | Ht 73.0 in | Wt 235.0 lb

## 2019-04-28 DIAGNOSIS — I2510 Atherosclerotic heart disease of native coronary artery without angina pectoris: Secondary | ICD-10-CM | POA: Diagnosis not present

## 2019-04-28 DIAGNOSIS — Z09 Encounter for follow-up examination after completed treatment for conditions other than malignant neoplasm: Secondary | ICD-10-CM | POA: Diagnosis not present

## 2019-04-28 DIAGNOSIS — F17210 Nicotine dependence, cigarettes, uncomplicated: Secondary | ICD-10-CM

## 2019-04-28 DIAGNOSIS — E1169 Type 2 diabetes mellitus with other specified complication: Secondary | ICD-10-CM | POA: Diagnosis not present

## 2019-04-28 DIAGNOSIS — I129 Hypertensive chronic kidney disease with stage 1 through stage 4 chronic kidney disease, or unspecified chronic kidney disease: Secondary | ICD-10-CM | POA: Diagnosis not present

## 2019-04-28 DIAGNOSIS — F209 Schizophrenia, unspecified: Secondary | ICD-10-CM

## 2019-04-28 DIAGNOSIS — E669 Obesity, unspecified: Secondary | ICD-10-CM

## 2019-04-28 DIAGNOSIS — E785 Hyperlipidemia, unspecified: Secondary | ICD-10-CM | POA: Diagnosis not present

## 2019-04-28 DIAGNOSIS — Z9181 History of falling: Secondary | ICD-10-CM | POA: Diagnosis not present

## 2019-04-28 DIAGNOSIS — J9620 Acute and chronic respiratory failure, unspecified whether with hypoxia or hypercapnia: Secondary | ICD-10-CM | POA: Diagnosis not present

## 2019-04-28 DIAGNOSIS — N183 Chronic kidney disease, stage 3 (moderate): Secondary | ICD-10-CM | POA: Diagnosis not present

## 2019-04-28 DIAGNOSIS — J441 Chronic obstructive pulmonary disease with (acute) exacerbation: Secondary | ICD-10-CM | POA: Diagnosis not present

## 2019-04-28 DIAGNOSIS — Z7984 Long term (current) use of oral hypoglycemic drugs: Secondary | ICD-10-CM | POA: Diagnosis not present

## 2019-04-28 DIAGNOSIS — I10 Essential (primary) hypertension: Secondary | ICD-10-CM | POA: Diagnosis not present

## 2019-04-28 DIAGNOSIS — E1122 Type 2 diabetes mellitus with diabetic chronic kidney disease: Secondary | ICD-10-CM | POA: Diagnosis not present

## 2019-04-28 DIAGNOSIS — Z7982 Long term (current) use of aspirin: Secondary | ICD-10-CM | POA: Diagnosis not present

## 2019-04-28 DIAGNOSIS — G4700 Insomnia, unspecified: Secondary | ICD-10-CM | POA: Diagnosis not present

## 2019-04-28 DIAGNOSIS — Z720 Tobacco use: Secondary | ICD-10-CM | POA: Diagnosis not present

## 2019-04-28 NOTE — Patient Instructions (Addendum)
Wellness due July 23 or after  F/U with MD in in October, call if you need me before  Please get fasting lipid, cmp and EGFr, HBA1C, cBC, the first week in July  Please continue test blood sugar and follow diet as you are , numbers loook great  Please continuee to think seriously about trying to quit smoking again  Thankful that nerves are better  It is important that you exercise regularly at least 30 minutes 5 times a week. If you develop chest pain, have severe difficulty breathing, or feel very tired, stop exercising immediately and seek medical attention    Think about what you will eat, plan ahead. Choose " clean, green, fresh or frozen" over canned, processed or packaged foods which are more sugary, salty and fatty. 70 to 75% of food eaten should be vegetables and fruit. Three meals at set times with snacks allowed between meals, but they must be fruit or vegetables. Aim to eat over a 12 hour period , example 7 am to 7 pm, and STOP after  your last meal of the day. Drink water,generally about 64 ounces per day, no other drink is as healthy. Fruit juice is best enjoyed in a healthy way, by EATING the fruit.   Thanks for choosing [facility], we consider it a privelige to serve you.

## 2019-04-28 NOTE — Telephone Encounter (Signed)
Left message for [facility]ory giving verbal ok for PT

## 2019-04-28 NOTE — Telephone Encounter (Signed)
Spoke with Mallory. The verbal orders needed were for OT , not PT. Gave the verbal ok. They will fax paperwork when complete.

## 2019-04-28 NOTE — Telephone Encounter (Signed)
Matthew Molina with [facility] for PT orders for Matthew Molina, please call --[phone]

## 2019-04-28 NOTE — Telephone Encounter (Signed)
Matthew Molina needing Verbal  Orders for PT  Please call her back @ [phone]

## 2019-04-28 NOTE — Telephone Encounter (Signed)
FYI

## 2019-04-28 NOTE — Telephone Encounter (Signed)
Noted, thanks!

## 2019-04-29 ENCOUNTER — Other Ambulatory Visit: Payer: Self-pay

## 2019-04-29 ENCOUNTER — Other Ambulatory Visit: Payer: Self-pay | Admitting: Family Medicine

## 2019-04-29 NOTE — Patient Outreach (Signed)
[facility]) Care Management  04/29/2019  Matthew Molina 1951/11/25 [id number]   Unsuccessful outreach attempt. Left HIPAA compliant voice message requesting a return call.   PLAN Will attempt outreach within 3-4 business days.   [place] [phone]

## 2019-04-29 NOTE — Telephone Encounter (Signed)
pls give the orders needed

## 2019-04-29 NOTE — Telephone Encounter (Signed)
Verbal ok left on [employer]

## 2019-04-30 DIAGNOSIS — Z7984 Long term (current) use of oral hypoglycemic drugs: Secondary | ICD-10-CM | POA: Diagnosis not present

## 2019-04-30 DIAGNOSIS — J441 Chronic obstructive pulmonary disease with (acute) exacerbation: Secondary | ICD-10-CM | POA: Diagnosis not present

## 2019-04-30 DIAGNOSIS — E1122 Type 2 diabetes mellitus with diabetic chronic kidney disease: Secondary | ICD-10-CM | POA: Diagnosis not present

## 2019-04-30 DIAGNOSIS — I2510 Atherosclerotic heart disease of native coronary artery without angina pectoris: Secondary | ICD-10-CM | POA: Diagnosis not present

## 2019-04-30 DIAGNOSIS — J9620 Acute and chronic respiratory failure, unspecified whether with hypoxia or hypercapnia: Secondary | ICD-10-CM | POA: Diagnosis not present

## 2019-04-30 DIAGNOSIS — E785 Hyperlipidemia, unspecified: Secondary | ICD-10-CM | POA: Diagnosis not present

## 2019-04-30 DIAGNOSIS — Z7982 Long term (current) use of aspirin: Secondary | ICD-10-CM | POA: Diagnosis not present

## 2019-04-30 DIAGNOSIS — I129 Hypertensive chronic kidney disease with stage 1 through stage 4 chronic kidney disease, or unspecified chronic kidney disease: Secondary | ICD-10-CM | POA: Diagnosis not present

## 2019-04-30 DIAGNOSIS — Z9181 History of falling: Secondary | ICD-10-CM | POA: Diagnosis not present

## 2019-04-30 DIAGNOSIS — N183 Chronic kidney disease, stage 3 (moderate): Secondary | ICD-10-CM | POA: Diagnosis not present

## 2019-04-30 DIAGNOSIS — G4700 Insomnia, unspecified: Secondary | ICD-10-CM | POA: Diagnosis not present

## 2019-05-01 ENCOUNTER — Encounter: Payer: Self-pay | Admitting: Family Medicine

## 2019-05-01 ENCOUNTER — Ambulatory Visit (INDEPENDENT_AMBULATORY_CARE_PROVIDER_SITE_OTHER): Payer: Medicare Other | Admitting: Family Medicine

## 2019-05-01 ENCOUNTER — Other Ambulatory Visit: Payer: Self-pay

## 2019-05-01 VITALS — BP 132/64 | Ht 73.0 in | Wt 235.0 lb

## 2019-05-01 DIAGNOSIS — G4700 Insomnia, unspecified: Secondary | ICD-10-CM | POA: Diagnosis not present

## 2019-05-01 DIAGNOSIS — N183 Chronic kidney disease, stage 3 (moderate): Secondary | ICD-10-CM | POA: Diagnosis not present

## 2019-05-01 DIAGNOSIS — J029 Acute pharyngitis, unspecified: Secondary | ICD-10-CM

## 2019-05-01 DIAGNOSIS — J441 Chronic obstructive pulmonary disease with (acute) exacerbation: Secondary | ICD-10-CM | POA: Diagnosis not present

## 2019-05-01 DIAGNOSIS — J9620 Acute and chronic respiratory failure, unspecified whether with hypoxia or hypercapnia: Secondary | ICD-10-CM | POA: Diagnosis not present

## 2019-05-01 DIAGNOSIS — Z9181 History of falling: Secondary | ICD-10-CM | POA: Diagnosis not present

## 2019-05-01 DIAGNOSIS — Z7984 Long term (current) use of oral hypoglycemic drugs: Secondary | ICD-10-CM | POA: Diagnosis not present

## 2019-05-01 DIAGNOSIS — E1122 Type 2 diabetes mellitus with diabetic chronic kidney disease: Secondary | ICD-10-CM | POA: Diagnosis not present

## 2019-05-01 DIAGNOSIS — Z7982 Long term (current) use of aspirin: Secondary | ICD-10-CM | POA: Diagnosis not present

## 2019-05-01 DIAGNOSIS — Z720 Tobacco use: Secondary | ICD-10-CM | POA: Diagnosis not present

## 2019-05-01 DIAGNOSIS — I10 Essential (primary) hypertension: Secondary | ICD-10-CM

## 2019-05-01 DIAGNOSIS — E785 Hyperlipidemia, unspecified: Secondary | ICD-10-CM | POA: Diagnosis not present

## 2019-05-01 DIAGNOSIS — I2510 Atherosclerotic heart disease of native coronary artery without angina pectoris: Secondary | ICD-10-CM | POA: Diagnosis not present

## 2019-05-01 DIAGNOSIS — I129 Hypertensive chronic kidney disease with stage 1 through stage 4 chronic kidney disease, or unspecified chronic kidney disease: Secondary | ICD-10-CM | POA: Diagnosis not present

## 2019-05-01 MED ORDER — PENICILLIN V POTASSIUM 500 MG PO TABS: 500.0000 mg | ORAL_TABLET | Freq: Three times a day (TID) | ORAL | 0 refills | Status: DC

## 2019-05-01 NOTE — Assessment & Plan Note (Signed)
Patient in for follow up of recent hospitalization. Discharge summary, and laboratory and radiology data are reviewed, and any questions or concerns about recent hospitalization are discussed. Specific issues requiring follow up are specifically addressed.  

## 2019-05-01 NOTE — Progress Notes (Signed)
   Matthew Molina     MRN: [id number]      DOB: January 18, 1951   HPI Matthew Molina is here for follow up and re-evaluation following recent hospitalization for COPD flare from 4/30 to 04/18/2019 at [facility] course is reviewed with Matthew Molina and medication reconciliation completed Unfortunately, although he is hospitalized because of complications from smoking he states adamantly , that he is unable to consider cutting back on smoking at this time and is back up to 1 PPD. He is counseled for 7 minutes re the need to quit and health benefits which will ensue. His admissions in the past several years have all involved respiratory decompensation.  ROS Denies recent fever or chills. Denies sinus pressure, nasal congestion, ear pain or sore throat. Denies chest congestion, productive cough still has  Wheezing, no shortness of breath at rest Denies chest pains, palpitations and leg swelling Denies abdominal pain, nausea, vomiting,diarrhea or constipation.   Denies dysuria, frequency, hesitancy or incontinence. C/o chronic joint pain,  and limitation in mobility. Denies headaches, seizures, numbness, or tingling. Denies uncontrolled depression, anxiety or insomnia. Denies skin break down or rash.   PE  BP 132/64   Pulse 95   Resp 14   Ht 6\' 1"  (1.854 m)   Wt 235 lb (106.6 kg)   SpO2 97%   BMI 31.00 kg/m   Patient alert and oriented and in no cardiopulmonary distress.  HEENT: No facial asymmetry, EOMI,   oropharynx pink and moist.  Neck decreased ROMno JVD, no mass.  Chest: decreased air entry throughout, scattered wheezes, no crackles  CVS: S1, S2 no murmurs, no S3.Regular rate.  ABD: Soft non tender.   Ext: No edema  MS: decreased  ROM spine, shoulders, hips and knees.  Skin: Intact, no ulcerations or rash noted.  Psych: Good eye contact,flat affect. Memory intact not anxious or depressed appearing.  CNS: CN 2-12 intact, power,  normal throughout.no focal deficits noted.    [facility] discharge follow-up Patient in for follow up of recent hospitalization. Discharge summary, and laboratory and radiology data are reviewed, and any questions or concerns about recent hospitalization are discussed. Specific issues requiring follow up are specifically addressed.'   Tobacco abuse Asked:confirms currently smokes 1 PPD of  cigarettes Assess: Unwilling to quit and unsure about  cutting back at this time Advise: needs to QUIT to reduce risk of cancer, cardio and cerebrovascular disease Assist: counseled for 7 minutes and literature provided Arrange: follow up in 3 months   Schizophrenia, unspecified type ([place]) Reports improvement and stability

## 2019-05-01 NOTE — Assessment & Plan Note (Signed)
Reports improvement and stability

## 2019-05-01 NOTE — Patient Instructions (Signed)
F/U as before, call if you need me sooner  You are treated for acute pharyngitis. I have prescribed 1 week of penicilliin , as you are at high risk for infection  Please gargle with salt water 2 to 3 times daily  Please cut back smoking cigarettes also

## 2019-05-01 NOTE — Assessment & Plan Note (Signed)
Asked:confirms currently smokes 1 PPD of  cigarettes Assess: Unwilling to quit and unsure about  cutting back at this time Advise: needs to QUIT to reduce risk of cancer, cardio and cerebrovascular disease Assist: counseled for 7 minutes and literature provided Arrange: follow up in 3 months

## 2019-05-02 ENCOUNTER — Other Ambulatory Visit: Payer: Self-pay | Admitting: Family Medicine

## 2019-05-02 ENCOUNTER — Encounter: Payer: Self-pay | Admitting: Family Medicine

## 2019-05-02 ENCOUNTER — Other Ambulatory Visit: Payer: Self-pay

## 2019-05-02 DIAGNOSIS — J449 Chronic obstructive pulmonary disease, unspecified: Secondary | ICD-10-CM | POA: Diagnosis not present

## 2019-05-02 DIAGNOSIS — J9620 Acute and chronic respiratory failure, unspecified whether with hypoxia or hypercapnia: Secondary | ICD-10-CM | POA: Diagnosis not present

## 2019-05-02 DIAGNOSIS — E1122 Type 2 diabetes mellitus with diabetic chronic kidney disease: Secondary | ICD-10-CM | POA: Diagnosis not present

## 2019-05-02 DIAGNOSIS — I129 Hypertensive chronic kidney disease with stage 1 through stage 4 chronic kidney disease, or unspecified chronic kidney disease: Secondary | ICD-10-CM | POA: Diagnosis not present

## 2019-05-02 DIAGNOSIS — Z9181 History of falling: Secondary | ICD-10-CM | POA: Diagnosis not present

## 2019-05-02 DIAGNOSIS — G4700 Insomnia, unspecified: Secondary | ICD-10-CM | POA: Diagnosis not present

## 2019-05-02 DIAGNOSIS — I2510 Atherosclerotic heart disease of native coronary artery without angina pectoris: Secondary | ICD-10-CM | POA: Diagnosis not present

## 2019-05-02 DIAGNOSIS — E785 Hyperlipidemia, unspecified: Secondary | ICD-10-CM | POA: Diagnosis not present

## 2019-05-02 DIAGNOSIS — Z7982 Long term (current) use of aspirin: Secondary | ICD-10-CM | POA: Diagnosis not present

## 2019-05-02 DIAGNOSIS — J441 Chronic obstructive pulmonary disease with (acute) exacerbation: Secondary | ICD-10-CM | POA: Diagnosis not present

## 2019-05-02 DIAGNOSIS — N183 Chronic kidney disease, stage 3 (moderate): Secondary | ICD-10-CM | POA: Diagnosis not present

## 2019-05-02 DIAGNOSIS — Z7984 Long term (current) use of oral hypoglycemic drugs: Secondary | ICD-10-CM | POA: Diagnosis not present

## 2019-05-02 NOTE — Progress Notes (Signed)
This visit type is conducted due to national recommendations for restrictions regarding the COVID -19 Pandemic. Due to the patient's age and / or co morbidities, this format is felt to be most appropriate at this time without adequate follow up. The patient has no access to video technology/ had technical difficulties with video, requiring transitioning to audio format  only ( telephone ). All issues noted this document were discussed and addressed,no physical exam can be performed in this format.  Virtual Visit via Telephone Note  I connected with Matthew Molina on 05/02/19 at  9:40 AM EDT by telephone and verified that I am speaking with the correct person using two identifiers.  Location: Patient: home Provider: office   I discussed the limitations, risks, security and privacy concerns of performing an evaluation and management service by telephone and the availability of in person appointments. I also discussed with the patient that there may be a patient responsible charge related to this service. The patient expressed understanding and agreed to proceed.   History of Present Illness: 3 day h/o sore throat and tender swollen neck glands Denies fever, or chills Denies recent fever or chills. Denies sinus pressure, nasal congestion, ear pain Denies chest congestion, productive cough or wheezing.  Chronic joint pain, swelling and limitation in mobility.       Observations/Objective: BP 132/64   Ht 6\' 1"  (1.854 m)   Wt 235 lb (106.6 kg)   BMI 31.00 kg/m  Good communication with no confusion and intact memory. Alert and oriented x 3 No signs of respiratory distress during sppech    Assessment and Plan: Acute pharyngitis Acute onset of sore throat with tender neck glands per history. Recently hospitalized and at high risk for complications of illness due to co morbidities, penicillin prescribed for 7 days  Hypertension Controlled, no change in medication   Tobacco  abuse Asked:confirms currently smokes cigarettes Assess: Unwilling to quit but cutting back Advise: needs to QUIT to reduce risk of cancer, cardio and cerebrovascular disease Assist: counseled for 5 minutes and literature provided Arrange: follow up     Follow Up Instructions:    I discussed the assessment and treatment plan with the patient. The patient was provided an opportunity to ask questions and all were answered. The patient agreed with the plan and demonstrated an understanding of the instructions.   The patient was advised to call back or seek an in-person evaluation if the symptoms worsen or if the condition fails to improve as anticipated.  I provided 12 minutes of non-face-to-face time during this encounter.   [name], MD

## 2019-05-02 NOTE — Assessment & Plan Note (Signed)
Asked:confirms currently smokes cigarettes Assess: Unwilling to quit but cutting back Advise: needs to QUIT to reduce risk of cancer, cardio and cerebrovascular disease Assist: counseled for 5 minutes and literature provided Arrange: follow up

## 2019-05-02 NOTE — Assessment & Plan Note (Signed)
Controlled, no change in medication  

## 2019-05-02 NOTE — Patient Outreach (Signed)
Matthew Molina Matthew Molina) Care Management  05/02/2019  Matthew Molina September 30, 1951 [id number]  Successful outreach with [name]. He has experienced a few episodes of exertional dyspnea within the last week. Also complained of a sore throat. He completed a telephonic outreach and discussed his symptoms with Matthew Molina on yesterday. Today he reports feeling "a lot better."  He reports compliance with medications but admits to not consistently monitoring his blood glucose. Reading on yesterday was 150mg /dl. He continues to weigh daily. Reports a weight of 230lbs.  Discussed care management needs. He is currently receiving physical therapy and skilled nursing services with [facility]. He is still receiving daily visits from his nursing aide. Denies need for nutritional or transportation assistance. Agreed to notify RNCM if needed prior to next scheduled outreach.  PLAN Will follow up to discuss blood glucose readings.   Matthew Molina [phone]

## 2019-05-02 NOTE — Assessment & Plan Note (Signed)
Acute onset of sore throat with tender neck glands per history. Recently hospitalized and at high risk for complications of illness due to co morbidities, penicillin prescribed for 7 days

## 2019-05-05 ENCOUNTER — Ambulatory Visit: Payer: Self-pay | Admitting: Family Medicine

## 2019-05-06 DIAGNOSIS — N183 Chronic kidney disease, stage 3 (moderate): Secondary | ICD-10-CM | POA: Diagnosis not present

## 2019-05-06 DIAGNOSIS — G4700 Insomnia, unspecified: Secondary | ICD-10-CM | POA: Diagnosis not present

## 2019-05-06 DIAGNOSIS — Z7984 Long term (current) use of oral hypoglycemic drugs: Secondary | ICD-10-CM | POA: Diagnosis not present

## 2019-05-06 DIAGNOSIS — Z9181 History of falling: Secondary | ICD-10-CM | POA: Diagnosis not present

## 2019-05-06 DIAGNOSIS — J9620 Acute and chronic respiratory failure, unspecified whether with hypoxia or hypercapnia: Secondary | ICD-10-CM | POA: Diagnosis not present

## 2019-05-06 DIAGNOSIS — I129 Hypertensive chronic kidney disease with stage 1 through stage 4 chronic kidney disease, or unspecified chronic kidney disease: Secondary | ICD-10-CM | POA: Diagnosis not present

## 2019-05-06 DIAGNOSIS — E785 Hyperlipidemia, unspecified: Secondary | ICD-10-CM | POA: Diagnosis not present

## 2019-05-06 DIAGNOSIS — J441 Chronic obstructive pulmonary disease with (acute) exacerbation: Secondary | ICD-10-CM | POA: Diagnosis not present

## 2019-05-06 DIAGNOSIS — I2510 Atherosclerotic heart disease of native coronary artery without angina pectoris: Secondary | ICD-10-CM | POA: Diagnosis not present

## 2019-05-06 DIAGNOSIS — E1122 Type 2 diabetes mellitus with diabetic chronic kidney disease: Secondary | ICD-10-CM | POA: Diagnosis not present

## 2019-05-06 DIAGNOSIS — Z7982 Long term (current) use of aspirin: Secondary | ICD-10-CM | POA: Diagnosis not present

## 2019-05-07 DIAGNOSIS — J9620 Acute and chronic respiratory failure, unspecified whether with hypoxia or hypercapnia: Secondary | ICD-10-CM | POA: Diagnosis not present

## 2019-05-07 DIAGNOSIS — J441 Chronic obstructive pulmonary disease with (acute) exacerbation: Secondary | ICD-10-CM | POA: Diagnosis not present

## 2019-05-07 DIAGNOSIS — E1122 Type 2 diabetes mellitus with diabetic chronic kidney disease: Secondary | ICD-10-CM | POA: Diagnosis not present

## 2019-05-07 DIAGNOSIS — I2510 Atherosclerotic heart disease of native coronary artery without angina pectoris: Secondary | ICD-10-CM | POA: Diagnosis not present

## 2019-05-07 DIAGNOSIS — I129 Hypertensive chronic kidney disease with stage 1 through stage 4 chronic kidney disease, or unspecified chronic kidney disease: Secondary | ICD-10-CM | POA: Diagnosis not present

## 2019-05-07 DIAGNOSIS — N183 Chronic kidney disease, stage 3 (moderate): Secondary | ICD-10-CM | POA: Diagnosis not present

## 2019-05-07 DIAGNOSIS — E785 Hyperlipidemia, unspecified: Secondary | ICD-10-CM | POA: Diagnosis not present

## 2019-05-07 DIAGNOSIS — Z7984 Long term (current) use of oral hypoglycemic drugs: Secondary | ICD-10-CM | POA: Diagnosis not present

## 2019-05-07 DIAGNOSIS — Z7982 Long term (current) use of aspirin: Secondary | ICD-10-CM | POA: Diagnosis not present

## 2019-05-07 DIAGNOSIS — G4700 Insomnia, unspecified: Secondary | ICD-10-CM | POA: Diagnosis not present

## 2019-05-07 DIAGNOSIS — Z9181 History of falling: Secondary | ICD-10-CM | POA: Diagnosis not present

## 2019-05-08 DIAGNOSIS — I2510 Atherosclerotic heart disease of native coronary artery without angina pectoris: Secondary | ICD-10-CM | POA: Diagnosis not present

## 2019-05-08 DIAGNOSIS — Z9181 History of falling: Secondary | ICD-10-CM | POA: Diagnosis not present

## 2019-05-08 DIAGNOSIS — J9620 Acute and chronic respiratory failure, unspecified whether with hypoxia or hypercapnia: Secondary | ICD-10-CM | POA: Diagnosis not present

## 2019-05-08 DIAGNOSIS — E785 Hyperlipidemia, unspecified: Secondary | ICD-10-CM | POA: Diagnosis not present

## 2019-05-08 DIAGNOSIS — G4700 Insomnia, unspecified: Secondary | ICD-10-CM | POA: Diagnosis not present

## 2019-05-08 DIAGNOSIS — Z7982 Long term (current) use of aspirin: Secondary | ICD-10-CM | POA: Diagnosis not present

## 2019-05-08 DIAGNOSIS — E1122 Type 2 diabetes mellitus with diabetic chronic kidney disease: Secondary | ICD-10-CM | POA: Diagnosis not present

## 2019-05-08 DIAGNOSIS — N183 Chronic kidney disease, stage 3 (moderate): Secondary | ICD-10-CM | POA: Diagnosis not present

## 2019-05-08 DIAGNOSIS — Z7984 Long term (current) use of oral hypoglycemic drugs: Secondary | ICD-10-CM | POA: Diagnosis not present

## 2019-05-08 DIAGNOSIS — J441 Chronic obstructive pulmonary disease with (acute) exacerbation: Secondary | ICD-10-CM | POA: Diagnosis not present

## 2019-05-08 DIAGNOSIS — I129 Hypertensive chronic kidney disease with stage 1 through stage 4 chronic kidney disease, or unspecified chronic kidney disease: Secondary | ICD-10-CM | POA: Diagnosis not present

## 2019-05-09 ENCOUNTER — Telehealth: Payer: Self-pay | Admitting: Family Medicine

## 2019-05-09 DIAGNOSIS — Z9181 History of falling: Secondary | ICD-10-CM | POA: Diagnosis not present

## 2019-05-09 DIAGNOSIS — N183 Chronic kidney disease, stage 3 (moderate): Secondary | ICD-10-CM | POA: Diagnosis not present

## 2019-05-09 DIAGNOSIS — I2510 Atherosclerotic heart disease of native coronary artery without angina pectoris: Secondary | ICD-10-CM | POA: Diagnosis not present

## 2019-05-09 DIAGNOSIS — J9620 Acute and chronic respiratory failure, unspecified whether with hypoxia or hypercapnia: Secondary | ICD-10-CM | POA: Diagnosis not present

## 2019-05-09 DIAGNOSIS — G4700 Insomnia, unspecified: Secondary | ICD-10-CM | POA: Diagnosis not present

## 2019-05-09 DIAGNOSIS — E1122 Type 2 diabetes mellitus with diabetic chronic kidney disease: Secondary | ICD-10-CM | POA: Diagnosis not present

## 2019-05-09 DIAGNOSIS — E785 Hyperlipidemia, unspecified: Secondary | ICD-10-CM | POA: Diagnosis not present

## 2019-05-09 DIAGNOSIS — Z7984 Long term (current) use of oral hypoglycemic drugs: Secondary | ICD-10-CM | POA: Diagnosis not present

## 2019-05-09 DIAGNOSIS — J441 Chronic obstructive pulmonary disease with (acute) exacerbation: Secondary | ICD-10-CM | POA: Diagnosis not present

## 2019-05-09 DIAGNOSIS — Z7982 Long term (current) use of aspirin: Secondary | ICD-10-CM | POA: Diagnosis not present

## 2019-05-09 DIAGNOSIS — I129 Hypertensive chronic kidney disease with stage 1 through stage 4 chronic kidney disease, or unspecified chronic kidney disease: Secondary | ICD-10-CM | POA: Diagnosis not present

## 2019-05-09 NOTE — Telephone Encounter (Signed)
Pt is calling states that his  Blood sugar is 206 and blood pressure 152/91  Wants someone to call him back

## 2019-05-09 NOTE — Telephone Encounter (Signed)
Patient scheduled for Friday but will call sooner if there is a cancellation. Pt states his triamterene was d/c'd and that's why its elevated

## 2019-05-12 DIAGNOSIS — G4700 Insomnia, unspecified: Secondary | ICD-10-CM | POA: Diagnosis not present

## 2019-05-12 DIAGNOSIS — Z9181 History of falling: Secondary | ICD-10-CM | POA: Diagnosis not present

## 2019-05-12 DIAGNOSIS — Z7984 Long term (current) use of oral hypoglycemic drugs: Secondary | ICD-10-CM | POA: Diagnosis not present

## 2019-05-12 DIAGNOSIS — J9620 Acute and chronic respiratory failure, unspecified whether with hypoxia or hypercapnia: Secondary | ICD-10-CM | POA: Diagnosis not present

## 2019-05-12 DIAGNOSIS — E785 Hyperlipidemia, unspecified: Secondary | ICD-10-CM | POA: Diagnosis not present

## 2019-05-12 DIAGNOSIS — E1122 Type 2 diabetes mellitus with diabetic chronic kidney disease: Secondary | ICD-10-CM | POA: Diagnosis not present

## 2019-05-12 DIAGNOSIS — I129 Hypertensive chronic kidney disease with stage 1 through stage 4 chronic kidney disease, or unspecified chronic kidney disease: Secondary | ICD-10-CM | POA: Diagnosis not present

## 2019-05-12 DIAGNOSIS — I2510 Atherosclerotic heart disease of native coronary artery without angina pectoris: Secondary | ICD-10-CM | POA: Diagnosis not present

## 2019-05-12 DIAGNOSIS — Z7982 Long term (current) use of aspirin: Secondary | ICD-10-CM | POA: Diagnosis not present

## 2019-05-12 DIAGNOSIS — N183 Chronic kidney disease, stage 3 (moderate): Secondary | ICD-10-CM | POA: Diagnosis not present

## 2019-05-12 DIAGNOSIS — J441 Chronic obstructive pulmonary disease with (acute) exacerbation: Secondary | ICD-10-CM | POA: Diagnosis not present

## 2019-05-14 DIAGNOSIS — G4700 Insomnia, unspecified: Secondary | ICD-10-CM | POA: Diagnosis not present

## 2019-05-14 DIAGNOSIS — Z9181 History of falling: Secondary | ICD-10-CM | POA: Diagnosis not present

## 2019-05-14 DIAGNOSIS — E1122 Type 2 diabetes mellitus with diabetic chronic kidney disease: Secondary | ICD-10-CM | POA: Diagnosis not present

## 2019-05-14 DIAGNOSIS — J9620 Acute and chronic respiratory failure, unspecified whether with hypoxia or hypercapnia: Secondary | ICD-10-CM | POA: Diagnosis not present

## 2019-05-14 DIAGNOSIS — I2510 Atherosclerotic heart disease of native coronary artery without angina pectoris: Secondary | ICD-10-CM | POA: Diagnosis not present

## 2019-05-14 DIAGNOSIS — Z7982 Long term (current) use of aspirin: Secondary | ICD-10-CM | POA: Diagnosis not present

## 2019-05-14 DIAGNOSIS — I129 Hypertensive chronic kidney disease with stage 1 through stage 4 chronic kidney disease, or unspecified chronic kidney disease: Secondary | ICD-10-CM | POA: Diagnosis not present

## 2019-05-14 DIAGNOSIS — N183 Chronic kidney disease, stage 3 (moderate): Secondary | ICD-10-CM | POA: Diagnosis not present

## 2019-05-14 DIAGNOSIS — J441 Chronic obstructive pulmonary disease with (acute) exacerbation: Secondary | ICD-10-CM | POA: Diagnosis not present

## 2019-05-14 DIAGNOSIS — E785 Hyperlipidemia, unspecified: Secondary | ICD-10-CM | POA: Diagnosis not present

## 2019-05-14 DIAGNOSIS — Z7984 Long term (current) use of oral hypoglycemic drugs: Secondary | ICD-10-CM | POA: Diagnosis not present

## 2019-05-15 DIAGNOSIS — Z7984 Long term (current) use of oral hypoglycemic drugs: Secondary | ICD-10-CM | POA: Diagnosis not present

## 2019-05-15 DIAGNOSIS — J9620 Acute and chronic respiratory failure, unspecified whether with hypoxia or hypercapnia: Secondary | ICD-10-CM | POA: Diagnosis not present

## 2019-05-15 DIAGNOSIS — E1122 Type 2 diabetes mellitus with diabetic chronic kidney disease: Secondary | ICD-10-CM | POA: Diagnosis not present

## 2019-05-15 DIAGNOSIS — E785 Hyperlipidemia, unspecified: Secondary | ICD-10-CM | POA: Diagnosis not present

## 2019-05-15 DIAGNOSIS — I2510 Atherosclerotic heart disease of native coronary artery without angina pectoris: Secondary | ICD-10-CM | POA: Diagnosis not present

## 2019-05-15 DIAGNOSIS — N183 Chronic kidney disease, stage 3 (moderate): Secondary | ICD-10-CM | POA: Diagnosis not present

## 2019-05-15 DIAGNOSIS — Z7982 Long term (current) use of aspirin: Secondary | ICD-10-CM | POA: Diagnosis not present

## 2019-05-15 DIAGNOSIS — J441 Chronic obstructive pulmonary disease with (acute) exacerbation: Secondary | ICD-10-CM | POA: Diagnosis not present

## 2019-05-15 DIAGNOSIS — Z9181 History of falling: Secondary | ICD-10-CM | POA: Diagnosis not present

## 2019-05-15 DIAGNOSIS — I129 Hypertensive chronic kidney disease with stage 1 through stage 4 chronic kidney disease, or unspecified chronic kidney disease: Secondary | ICD-10-CM | POA: Diagnosis not present

## 2019-05-15 DIAGNOSIS — G4700 Insomnia, unspecified: Secondary | ICD-10-CM | POA: Diagnosis not present

## 2019-05-16 ENCOUNTER — Other Ambulatory Visit: Payer: Self-pay

## 2019-05-16 ENCOUNTER — Ambulatory Visit (INDEPENDENT_AMBULATORY_CARE_PROVIDER_SITE_OTHER): Payer: Medicare Other | Admitting: Family Medicine

## 2019-05-16 ENCOUNTER — Encounter: Payer: Self-pay | Admitting: Family Medicine

## 2019-05-16 VITALS — BP 108/74 | HR 96 | Temp 97.9°F | Resp 14 | Ht 73.0 in | Wt 234.1 lb

## 2019-05-16 DIAGNOSIS — I491 Atrial premature depolarization: Secondary | ICD-10-CM | POA: Diagnosis not present

## 2019-05-16 DIAGNOSIS — R2681 Unsteadiness on feet: Secondary | ICD-10-CM | POA: Diagnosis not present

## 2019-05-16 DIAGNOSIS — E1169 Type 2 diabetes mellitus with other specified complication: Secondary | ICD-10-CM | POA: Diagnosis not present

## 2019-05-16 DIAGNOSIS — I10 Essential (primary) hypertension: Secondary | ICD-10-CM

## 2019-05-16 DIAGNOSIS — E669 Obesity, unspecified: Secondary | ICD-10-CM

## 2019-05-16 DIAGNOSIS — R42 Dizziness and giddiness: Secondary | ICD-10-CM | POA: Diagnosis not present

## 2019-05-16 LAB — GLUCOSE, POCT (MANUAL RESULT ENTRY): POC Glucose: 142 mg/dl — AB (ref 70–99)

## 2019-05-16 NOTE — Progress Notes (Signed)
Subjective:     Patient ID: Matthew Molina, male   DOB: [birth year], 68 y.o.   MRN: [id number]  Matthew Molina presents for Hypertension (bp follow up) Matthew Molina has a significant history which includes COPD, type 2 diabetes, CKD stage III, hyperlipidemia, obesity, schizophrenia, tobacco abuse.  [name] is a 68 year old patient who presents today for follow-up of blood pressure.  Upon arriving to the office he reports that he was feeling dizzy.  He reports he was starting to feel dizzy on the way into the office. Dizziness continued while in the room with provider. Reports he ate oatmeal this morning, but forgot to check is blood sugar.  Additionally, reports some vision changes, denies chest pain or tightness.  He reports that he started taking his Maxide as directed.  He had previously stopped taking it as he was told to stop taking it.  Unsure of the confusion and miscommunication regarding this change.  He denies having any dizziness and or vision changes or headaches or chest pain or chest tightness outside of what is been reported above.  He has not had any of these issues at home.  Admits that for today.  He reports that he has been taking his blood pressure at home and getting readings of 120/80 and 130s over 70s.  He denies having any trouble taking his medications.  Or affording his medications at this time.  Denies having any fever, chills, sinus congestion and/or changes.  Denies having any excessive shortness of breath or cough outside of his baseline.  Denies having any changes in bowel or bladder habits.  Denies having any sleep trouble.  Denies having any memory trouble.  Past Medical, Surgical, Social History, Allergies, and Medications have been Reviewed.   Past Medical History:  Diagnosis Date   Acute kidney injury ([place])    Acute respiratory failure with hypoxia (HCC)    Arthritis    COPD (chronic obstructive pulmonary disease) ([place])    Depression    Diabetes  mellitus    Diabetes mellitus without complication ([place])    Hypercholesterolemia    Hyperlipidemia    Hypertension    Multiple lung nodules on CT 04/02/2015   Nicotine addiction    Obesity    Oxygen deficiency    qhs   Schizophrenia [facility])    Past Surgical History:  Procedure Laterality Date   CATARACT EXTRACTION W/PHACO Left 11/20/2013   Procedure: CATARACT EXTRACTION PHACO AND INTRAOCULAR LENS PLACEMENT ([place]);  Surgeon: Tonny Branch, MD;  Location: [facility];  Service: Ophthalmology;  Laterality: Left;  CDE:10.26   CATARACT EXTRACTION W/PHACO Right 12/08/2013   Procedure: RIGHT EYE CATARACT EXTRACTION PHACO AND INTRAOCULAR LENS PLACEMENT ;  Surgeon: Tonny Branch, MD;  Location: [facility];  Service: Ophthalmology;  Laterality: Right;  CDE 12.38   COLONOSCOPY N/A 04/01/2013   Procedure: COLONOSCOPY;  Surgeon: Danie Binder, MD;  Location: [facility] SU[facility];  Service: Endoscopy;  Laterality: N/A;  10:00 AM-moved to [place] notified pt   COLONOSCOPY  2014   INCOMPLETE PREP IN R COLON   ESOPHAGOGASTRODUODENOSCOPY N/A 02/18/2016   Procedure: ESOPHAGOGASTRODUODENOSCOPY (EGD);  Surgeon: Danie Binder, MD;  Location: [facility];  Service: Endoscopy;  Laterality: N/A;  1145   EYE SURGERY Left 11/2013   cataract extraction   SAVORY DILATION N/A 02/18/2016   Procedure: SAVORY DILATION;  Surgeon: Danie Binder, MD;  Location: [facility];  Service: Endoscopy;  Laterality: N/A;   SHOULDER SURGERY  Social History   Socioeconomic History   Marital status: Divorced    Spouse name: Not on file   Number of children: 2   Years of education: Not on file   Highest education level: Not on file  Occupational History   Occupation: retired from [employer]: Not hard at all   Food insecurity:    Worry: Never true    Inability: Never true   Transportation needs:    Medical: No    Non-medical: No  Tobacco Use   Smoking status: Current  Every Day Smoker    Packs/day: 1.00    Years: 48.00    Pack years: 48.00    Types: Cigarettes    Last attempt to quit: 05/02/2017    Years since quitting: 2.0   Smokeless tobacco: Never Used  Substance and Sexual Activity   Alcohol use: Yes    Alcohol/week: 0.0 standard drinks   Drug use: No   Sexual activity: Not Currently    Birth control/protection: None  Lifestyle   Physical activity:    Days per week: 0 days    Minutes per session: 0 min   Stress: Not at all  Relationships   Social connections:    Talks on phone: Twice a week    Gets together: Never    Attends religious service: More than 4 times per year    Active member of club or organization: No    Attends meetings of clubs or organizations: Never    Relationship status: Divorced   Intimate partner violence:    Fear of current or ex partner: No    Emotionally abused: No    Physically abused: No    Forced sexual activity: No  Other Topics Concern   Not on file  Social History Narrative   ** Merged History Encounter **       ** Merged History Encounter **        Outpatient Encounter Medications as of 05/16/2019  Medication Sig   ACCU-CHEK SOFTCLIX LANCETS lancets TEST BLOOD SUGAR ONCE DAILY AS DIRECTED.   acetaminophen (TYLENOL) 500 MG tablet Take 500-1,000 mg by mouth daily as needed. Takes mostly for HA about 2 x/week.   albuterol (PROAIR HFA) 108 (90 Base) MCG/ACT inhaler INHALE 2 PUFFS INTO THE LUNGS EVERY 6 HOURS AS NEEDED FOR WHEEZING ORSHORTNESS OF BREATH.   aspirin EC 81 MG tablet Take 81 mg by mouth daily.   budesonide-formoterol (SYMBICORT) 80-4.5 MCG/ACT inhaler INHALE 2 PUFFS INTO LUNGS TWICE DAILY.   busPIRone (BUSPAR) 7.5 MG tablet TAKE 1 TABLET BY MOUTH 3 TIMES DAILY.   GLIPIZIDE XL 5 MG 24 hr tablet TAKE 1 TABLET DAILY WITH BREAKFAST.   glucose blood (ACCU-CHEK AVIVA PLUS) test strip USE TO TEST ONCE DAILY   ipratropium-albuterol (DUONEB) 0.5-2.5 (3) MG/3ML SOLN USE 1 VIAL(3  ML) IN NEBULIZER EVERY 4 TO 6 HOURS   olmesartan (BENICAR) 40 MG tablet TAKE 1 TABLET BY MOUTH ONCE A DAY.   pravastatin (PRAVACHOL) 20 MG tablet TAKE (1) TABLET BY MOUTH AT BEDTIME FOR CHOLESTEROL.   risperiDONE (RISPERDAL) 1 MG tablet TAKE (1) TABLET BY MOUTH AT BEDTIME WITH 4MG .   risperidone (RISPERDAL) 4 MG tablet Takes with a 1 mg tablet at night to = 5 mg dose   tiZANidine (ZANAFLEX) 2 MG tablet One tablet once daily as needed , for muscle spasm (Patient taking differently: Take 2 mg by mouth. One tablet once daily as needed ,  for muscle spasm Pt. Takes everyday.)   traZODone (DESYREL) 50 MG tablet Take 1 tablet (50 mg total) by mouth at bedtime as needed. for sleep   triamterene-hydrochlorothiazide (MAXZIDE-25) 37.5-25 MG tablet Take 0.5 tablets by mouth daily.   [DISCONTINUED] penicillin v potassium (VEETID) 500 MG tablet Take 1 tablet (500 mg total) by mouth 3 (three) times daily. (Patient not taking: Reported on 05/16/2019)   [DISCONTINUED] sildenafil (VIAGRA) 100 MG tablet Take 100 mg by mouth. Take one tablet 30 mins before intercourse    [DISCONTINUED] tiotropium (SPIRIVA) 18 MCG inhalation capsule Place 1 capsule (18 mcg total) into inhaler and inhale daily. (Patient not taking: Reported on 05/16/2019)   No facility-administered encounter medications on file as of 05/16/2019.    Allergies  Allergen Reactions   Sertraline Hcl     Stomach upset/pain   Wellbutrin [Bupropion] Other (See Comments)    Makes stomach hurt    Review of Systems  Constitutional: Negative for chills and fever.  HENT: Negative.   Eyes: Positive for visual disturbance.  Respiratory: Positive for cough. Negative for chest tightness and shortness of breath.   Cardiovascular: Negative for chest pain and leg swelling.  Gastrointestinal: Negative.   Endocrine: Negative.   Genitourinary: Negative.   Musculoskeletal: Negative.   Skin: Negative.   Allergic/Immunologic: Negative.   Neurological:  Positive for dizziness. Negative for headaches.  Hematological: Negative.   Psychiatric/Behavioral: Negative.   All other systems reviewed and are negative.      Objective:     BP 108/74    Pulse 96    Temp 97.9 F (36.6 C) (Oral)    Resp 14    Ht 6\' 1"  (1.854 m)    Wt 234 lb 1.3 oz (106.2 kg)    SpO2 96%    BMI 30.88 kg/m   Physical Exam Constitutional:      Appearance: Normal appearance. He is obese.  HENT:     Head: Normocephalic and atraumatic.     Right Ear: External ear normal.     Left Ear: External ear normal.     Nose: Nose normal.     Mouth/Throat:     Mouth: Mucous membranes are moist.     Pharynx: Oropharynx is clear.  Eyes:     General:        Right eye: No discharge.        Left eye: No discharge.     Conjunctiva/sclera: Conjunctivae normal.  Neck:     Musculoskeletal: Normal range of motion and neck supple.  Cardiovascular:     Rate and Rhythm: Regular rhythm. Tachycardia present. Frequent extrasystoles are present.    Pulses:          Radial pulses are 2+ on the right side and 2+ on the left side.     Heart sounds: Normal heart sounds. Heart sounds not distant. No murmur.  Pulmonary:     Effort: Pulmonary effort is normal.     Breath sounds: Normal breath sounds.  Abdominal:     General: Bowel sounds are normal.  Musculoskeletal: Normal range of motion.     Right lower leg: No edema.     Left lower leg: No edema.  Skin:    General: Skin is warm and dry.     Capillary Refill: Capillary refill takes less than 2 seconds.  Neurological:     Mental Status: He is alert and oriented to person, place, and time.  Psychiatric:        Mood  and Affect: Mood normal.        Behavior: Behavior normal.        Thought Content: Thought content normal.        Judgment: Judgment normal.      EKG: unchanged from previous tracings, PAC's noted, left anterior fascicular block pulmonary disease pattern, sinus tachycardia with first-degree AV block with premature  atrial complexes EKG.  Previously consistent with EKG on April 30 of 2020.Marland Kitchen  Glucose In Office: 142    Assessment and Plan       1. Essential hypertension Was getting elevated readings at home and an elevated reading on the last check with [name].  Advised to come in as he had reported that he has stopped taking his Maxide.  [name] reported that he should start taking his Maxide again.  Current readings at home have been in the 120s to the 810F systolically.  He reports that he is feeling better.  He denies having any dizziness at home outside of what he is up reported today.  Denies having any other signs of symptoms of high blood pressure.  Is strongly encouraged to maintain hydration as this could be a culprit new dizziness as he is restarted his diuretic.  Patient acknowledged agreement and understanding of the plan.  - EKG 12-Lead  2. Dizziness Reported having some dizziness when he arrived at the front desk.  Upon taking him back to the room he was still having dizziness.  Upon examination he was still having some dizziness.  And unable to stand for more than a second at a time.  Without becoming very dizzy.  Provided with water today during the office visit allow him to sit for a while.  Additional time still did not improve dizziness.  Upon listening to heart rate he sounded like he might have some skipped beats.  EKG was collected and point-of-care glucose was collected as well.  Please see above for documentation of this.  EKG was in line with previous form.  Close level was within range.  After sitting for 3040 minutes in the office.  He began to feel little bit better.  He is advised to maintain hydration to help make sure that he is not dehydrated.  As this could make his symptoms worse.  As he does have some PACs on his EKG.  Additionally he had recently started taking his medicine back that is Maxide and it has hydrochlorothiazide in it and it might be making him have increased  output.  Patient acknowledged agreement and understanding of the plan.    - EKG 12-Lead - POCT Glucose (CBG)  3. Diabetes mellitus type 2 in obese (HCC) Controlled, continue current medication regime at this time.    - POCT Glucose (CBG)  4. Unsteady gait Encouraged to make sure that he utilizes a cane during all times that he is walking.  Additionally making sure that he is more mindful with his movements and changing of position.  Concerned for fall risk.  Patient acknowledged agreement and understanding of the plan.   5. PAC (premature atrial contraction) PACs were noted on EKG these were not noted on the previous EKG but all other findings were consistent with previous EKG.  Advised for him to make sure he is maintaining adequate hydration as he is now taking I diuretic.  Concern for possible need to reduce the amount of diuretic or level of medication in the combo pill.  Will follow closely  Patient acknowledged agreement  and understanding of the plan.   - EKG 12-Lead   Follow-up: 07/15/2019       Perlie Mayo, DNP, AGNP-BC [place], [address], [place] [address] Office Hours: Mon-Thurs 8 am-5 pm; Fri 8 am-12 pm Office Phone:  [phone]  Office Fax: [phone]

## 2019-05-16 NOTE — Patient Instructions (Signed)
    Thank you for coming into the office today. I appreciate the opportunity to provide you with the care for your health and wellness. Today we discussed: Blood pressure and dizziness  Matthew Molina overall today your blood pressure looks much better it is on the lower end but per your readings at home it seems to be consistent with keeping you on the medication that you are currently taking.  Today you complained of having some dizziness while you were in the office.  Your blood sugar checked out okay and your EKG is consistent with the previous EKG you had back in April.  It does look like you might have occasional extra beats or early beats and this can sometimes make some individuals feel little bit dizzy especially if they are little dehydrated.  I provided you with water today to make sure that you were adequately hydrated please continue to drink water regularly throughout the day so that you can keep hydrated and that your heart does not feel the impact of dehydration and that you do not feel dizzy which is also a sign of dehydration.  Please continue take all your medications as directed if you have any trouble with any of these please do not hesitate to reach out to [place].  We are here for you.  Please continue to practice social distancing at this time for your safety and your family and our community. If you must go out just as you have done today please continue to wear your mask and make sure that you are practicing good hand hygiene.   [place] YOUR HANDS WELL AND FREQUENTLY. AVOID TOUCHING YOUR FACE, UNLESS YOUR HANDS ARE FRESHLY WASHED.  GET FRESH AIR DAILY. STAY HYDRATED WITH WATER.   It was a pleasure to see you and I look forward to continuing to work together on your health and well-being. Please do not hesitate to call the office if you need care or have questions about your care.  Have a wonderful day and week. With Gratitude, Cherly Beach, DNP, AGNP-BC

## 2019-05-20 DIAGNOSIS — J441 Chronic obstructive pulmonary disease with (acute) exacerbation: Secondary | ICD-10-CM | POA: Diagnosis not present

## 2019-05-20 DIAGNOSIS — G4700 Insomnia, unspecified: Secondary | ICD-10-CM | POA: Diagnosis not present

## 2019-05-20 DIAGNOSIS — N183 Chronic kidney disease, stage 3 (moderate): Secondary | ICD-10-CM | POA: Diagnosis not present

## 2019-05-20 DIAGNOSIS — Z7982 Long term (current) use of aspirin: Secondary | ICD-10-CM | POA: Diagnosis not present

## 2019-05-20 DIAGNOSIS — I129 Hypertensive chronic kidney disease with stage 1 through stage 4 chronic kidney disease, or unspecified chronic kidney disease: Secondary | ICD-10-CM | POA: Diagnosis not present

## 2019-05-20 DIAGNOSIS — Z9181 History of falling: Secondary | ICD-10-CM | POA: Diagnosis not present

## 2019-05-20 DIAGNOSIS — E1122 Type 2 diabetes mellitus with diabetic chronic kidney disease: Secondary | ICD-10-CM | POA: Diagnosis not present

## 2019-05-20 DIAGNOSIS — Z7984 Long term (current) use of oral hypoglycemic drugs: Secondary | ICD-10-CM | POA: Diagnosis not present

## 2019-05-20 DIAGNOSIS — I2510 Atherosclerotic heart disease of native coronary artery without angina pectoris: Secondary | ICD-10-CM | POA: Diagnosis not present

## 2019-05-20 DIAGNOSIS — J9620 Acute and chronic respiratory failure, unspecified whether with hypoxia or hypercapnia: Secondary | ICD-10-CM | POA: Diagnosis not present

## 2019-05-20 DIAGNOSIS — E785 Hyperlipidemia, unspecified: Secondary | ICD-10-CM | POA: Diagnosis not present

## 2019-05-22 ENCOUNTER — Other Ambulatory Visit: Payer: Self-pay

## 2019-05-22 DIAGNOSIS — Z9181 History of falling: Secondary | ICD-10-CM | POA: Diagnosis not present

## 2019-05-22 DIAGNOSIS — J9620 Acute and chronic respiratory failure, unspecified whether with hypoxia or hypercapnia: Secondary | ICD-10-CM | POA: Diagnosis not present

## 2019-05-22 DIAGNOSIS — E785 Hyperlipidemia, unspecified: Secondary | ICD-10-CM | POA: Diagnosis not present

## 2019-05-22 DIAGNOSIS — J441 Chronic obstructive pulmonary disease with (acute) exacerbation: Secondary | ICD-10-CM | POA: Diagnosis not present

## 2019-05-22 DIAGNOSIS — G4700 Insomnia, unspecified: Secondary | ICD-10-CM | POA: Diagnosis not present

## 2019-05-22 DIAGNOSIS — E1122 Type 2 diabetes mellitus with diabetic chronic kidney disease: Secondary | ICD-10-CM | POA: Diagnosis not present

## 2019-05-22 DIAGNOSIS — Z7982 Long term (current) use of aspirin: Secondary | ICD-10-CM | POA: Diagnosis not present

## 2019-05-22 DIAGNOSIS — N183 Chronic kidney disease, stage 3 (moderate): Secondary | ICD-10-CM | POA: Diagnosis not present

## 2019-05-22 DIAGNOSIS — Z7984 Long term (current) use of oral hypoglycemic drugs: Secondary | ICD-10-CM | POA: Diagnosis not present

## 2019-05-22 DIAGNOSIS — I2510 Atherosclerotic heart disease of native coronary artery without angina pectoris: Secondary | ICD-10-CM | POA: Diagnosis not present

## 2019-05-22 DIAGNOSIS — I129 Hypertensive chronic kidney disease with stage 1 through stage 4 chronic kidney disease, or unspecified chronic kidney disease: Secondary | ICD-10-CM | POA: Diagnosis not present

## 2019-05-22 NOTE — Patient Outreach (Signed)
[facility]) Care Management  05/22/2019  Matthew Molina 12-04-1951 [id number]    Successful outreach with [name]. He continues to experience exertional dyspnea. Reports symptoms resolve quickly with use of nebulizer, supplemental oxygen and rest. Reports compliance with medications and improvement with fasting blood glucose monitoring. Reports morning reading of 115mg /dl. Attempting to remain compliant with diabetic diet recommendations.  Morning weight-230lbs.  BP-121/81.  Matthew Molina denies immediate care management needs. Agreed to contact RNCM if needed prior to next outreach.  PLAN -Will follow-up later this month.   [place] [phone]

## 2019-05-26 ENCOUNTER — Other Ambulatory Visit: Payer: Self-pay | Admitting: Family Medicine

## 2019-05-26 ENCOUNTER — Ambulatory Visit (INDEPENDENT_AMBULATORY_CARE_PROVIDER_SITE_OTHER): Payer: Medicare Other | Admitting: Family Medicine

## 2019-05-26 ENCOUNTER — Other Ambulatory Visit: Payer: Self-pay

## 2019-05-26 VITALS — BP 110/72 | Ht 73.0 in | Wt 234.0 lb

## 2019-05-26 DIAGNOSIS — I10 Essential (primary) hypertension: Secondary | ICD-10-CM | POA: Diagnosis not present

## 2019-05-26 DIAGNOSIS — J441 Chronic obstructive pulmonary disease with (acute) exacerbation: Secondary | ICD-10-CM | POA: Diagnosis not present

## 2019-05-26 DIAGNOSIS — Z720 Tobacco use: Secondary | ICD-10-CM | POA: Diagnosis not present

## 2019-05-26 DIAGNOSIS — F17210 Nicotine dependence, cigarettes, uncomplicated: Secondary | ICD-10-CM

## 2019-05-26 DIAGNOSIS — J449 Chronic obstructive pulmonary disease, unspecified: Secondary | ICD-10-CM

## 2019-05-26 MED ORDER — METHYLPREDNISOLONE ACETATE 80 MG/ML IJ SUSP
80.0000 mg | Freq: Once | INTRAMUSCULAR | Status: AC
  Administered 2019-05-26: 80 mg via INTRAMUSCULAR

## 2019-05-26 MED ORDER — PREDNISONE 10 MG (21) PO TBPK: ORAL_TABLET | ORAL | 0 refills | Status: AC

## 2019-05-26 NOTE — Assessment & Plan Note (Deleted)
Current flare, send pred 10 mg dose pack and pt to come at 2: 30 pm for depo medrol 80 mg iM

## 2019-05-26 NOTE — Assessment & Plan Note (Signed)
Controlled, no change in medication DASH diet and commitment to daily physical activity for a minimum of 30 minutes discussed and encouraged, as a part of hypertension management. The importance of attaining a healthy weight is also discussed.  BP/Weight 05/26/2019 05/16/2019 05/01/2019 04/28/2019 04/18/2019 04/17/2019 4/70/7615  Systolic BP 183 437 357 897 847 - 841  Diastolic BP 72 74 64 64 78 - 80  Wt. (Lbs) 234 234.08 235 235 - 230 -  BMI 30.87 30.88 31 31 - 30.34 -  Some encounter information is confidential and restricted. Go to Review Flowsheets activity to see all data.

## 2019-05-26 NOTE — Progress Notes (Signed)
C/o cough and shortness of breath x 5 days , no fever or chills,occasional sputum white , no stuffiness in nose no ear pain, sore throat  Will erx pred dose pack , he is coming in at 2; 30 pm, nurse visit only , depo medrol 80 mg IM  Virtual Visit via Telephone Note  I connected with Matthew Molina on 05/26/19 at  9:20 AM EDT by telephone and verified that I am speaking with the correct person using two identifiers.  Location: Patient: home, pt into the office the same afternoon for depo medrol IM Provider: office   I discussed the limitations, risks, security and privacy concerns of performing an evaluation and management service by telephone and the availability of in person appointments. I also discussed with the patient that there may be a patient responsible charge related to this service. The patient expressed understanding and agreed to proceed.   History of Present Illness: 5 day h/o cough and shortness of breath as above, and wheezing, states he ended upo in the hospital the last time he was like this Denies recent fever or chills. Denies sinus pressure, nasal congestion, ear pain or sore throat.  Denies chest pains, palpitations and leg swelling Denies abdominal pain, nausea, vomiting,diarrhea or constipation.   Denies dysuria, frequency, hesitancy or incontinence. Chronic joint pain, swelling and limitation in mobility. Denies headaches, seizures, numbness, or tingling. Denies depression, anxiety or insomnia. Denies skin break down or rash.       Observations/Objective: BP 110/72   Ht 6\' 1"  (1.854 m)   Wt 234 lb (106.1 kg)   BMI 30.87 kg/m  Good communication with no confusion and intact memory. Alert and oriented x 3 signs of respiratory distress during speech, coughing excessively and short of breath a times when speaking in sentences    Assessment and Plan: Hypertension Controlled, no change in medication DASH diet and commitment to daily physical activity  for a minimum of 30 minutes discussed and encouraged, as a part of hypertension management. The importance of attaining a healthy weight is also discussed.  BP/Weight 05/26/2019 05/16/2019 05/01/2019 04/28/2019 04/18/2019 04/17/2019 2/83/1517  Systolic BP 616 073 710 626 948 - 546  Diastolic BP 72 74 64 64 78 - 80  Wt. (Lbs) 234 234.08 235 235 - 230 -  BMI 30.87 30.88 31 31 - 30.34 -  Some encounter information is confidential and restricted. Go to Review Flowsheets activity to see all data.       Tobacco abuse Asked:confirms currently smokes cigarettes Assess: Unwilling to quit but trying to cut back Advise: needs to QUIT to reduce risk of cancer, cardio and cerebrovascular disease Assist: counseled for 5 minutes and literature provided Arrange: follow up in 3 months   COPD with acute exacerbation ([place]) 5 day h/o progressive worsening of symptoms, Depo Medrol 80 mg iM and prednisone 10 mg dose pack and neb treatments at home 3 times daily for at least 3 days     Follow Up Instructions:    I discussed the assessment and treatment plan with the patient. The patient was provided an opportunity to ask questions and all were answered. The patient agreed with the plan and demonstrated an understanding of the instructions.   The patient was advised to call back or seek an in-person evaluation if the symptoms worsen or if the condition fails to improve as anticipated.  I provided 21  minutes of non-face-to-face time during this encounter.   [name], MD

## 2019-05-26 NOTE — Assessment & Plan Note (Signed)
Asked:confirms currently smokes cigarettes Assess: Unwilling to quit but trying to cut back Advise: needs to QUIT to reduce risk of cancer, cardio and cerebrovascular disease Assist: counseled for 5 minutes and literature provided Arrange: follow up in 3 months

## 2019-05-26 NOTE — Assessment & Plan Note (Signed)
5 day h/o progressive worsening of symptoms, Depo Medrol 80 mg iM and prednisone 10 mg dose pack and neb treatments at home 3 times daily for at least 3 days

## 2019-05-26 NOTE — Patient Instructions (Addendum)
F/u as before, call if you need me sooner ' You are treated for flare of COPD. Please come today at 2: 30 pm as discussed for Depo medrol 80 mg IM Prednisone dose pack is prescribed, take for the entire 7 days Use breathing treatment 3 times daily until your breathing improves  Stopping smoking will improve your breathing ad help to reduce these episodes   Steps to Quit Smoking  Smoking tobacco can be bad for your health. It can also affect almost every organ in your body. Smoking puts you and people around you at risk for many serious long-lasting (chronic) diseases. Quitting smoking is hard, but it is one of the best things that you can do for your health. It is never too late to quit. What are the benefits of quitting smoking? When you quit smoking, you lower your risk for getting serious diseases and conditions. They can include:  Lung cancer or lung disease.  Heart disease.  Stroke.  Heart attack.  Not being able to have children (infertility).  Weak bones (osteoporosis) and broken bones (fractures). If you have coughing, wheezing, and shortness of breath, those symptoms may get better when you quit. You may also get sick less often. If you are pregnant, quitting smoking can help to lower your chances of having a baby of low birth weight. What can I do to help me quit smoking? Talk with your doctor about what can help you quit smoking. Some things you can do (strategies) include:  Quitting smoking totally, instead of slowly cutting back how much you smoke over a period of time.  Going to in-person counseling. You are more likely to quit if you go to many counseling sessions.  Using resources and support systems, such as: ? Database administrator with a Social worker. ? Phone quitlines. ? Careers information officer. ? Support groups or group counseling. ? Text messaging programs. ? Mobile phone apps or applications.  Taking medicines. Some of these medicines may have nicotine in  them. If you are pregnant or breastfeeding, do not take any medicines to quit smoking unless your doctor says it is okay. Talk with your doctor about counseling or other things that can help you. Talk with your doctor about using more than one strategy at the same time, such as taking medicines while you are also going to in-person counseling. This can help make quitting easier. What things can I do to make it easier to quit? Quitting smoking might feel very hard at first, but there is a lot that you can do to make it easier. Take these steps:  Talk to your family and friends. Ask them to support and encourage you.  Call phone quitlines, reach out to support groups, or work with a Social worker.  Ask people who smoke to not smoke around you.  Avoid places that make you want (trigger) to smoke, such as: ? Bars. ? Parties. ? Smoke-break areas at work.  Spend time with people who do not smoke.  Lower the stress in your life. Stress can make you want to smoke. Try these things to help your stress: ? Getting regular exercise. ? Deep-breathing exercises. ? Yoga. ? Meditating. ? Doing a body scan. To do this, close your eyes, focus on one area of your body at a time from head to toe, and notice which parts of your body are tense. Try to relax the muscles in those areas.  Download or buy apps on your mobile phone or tablet that can help  you stick to your quit plan. There are many free apps, such as QuitGuide from the [employer] Office manager for Disease Control and Prevention). You can find more support from smokefree.gov and other websites. This information is not intended to replace advice given to you by your health care provider. Make sure you discuss any questions you have with your health care provider. Document Released: 09/30/2009 Document Revised: 08/01/2016 Document Reviewed: 04/20/2015 Elsevier Interactive Patient Education  2019 [employer].

## 2019-05-28 DIAGNOSIS — E785 Hyperlipidemia, unspecified: Secondary | ICD-10-CM | POA: Diagnosis not present

## 2019-05-28 DIAGNOSIS — I129 Hypertensive chronic kidney disease with stage 1 through stage 4 chronic kidney disease, or unspecified chronic kidney disease: Secondary | ICD-10-CM | POA: Diagnosis not present

## 2019-05-28 DIAGNOSIS — N183 Chronic kidney disease, stage 3 (moderate): Secondary | ICD-10-CM | POA: Diagnosis not present

## 2019-05-28 DIAGNOSIS — E1122 Type 2 diabetes mellitus with diabetic chronic kidney disease: Secondary | ICD-10-CM | POA: Diagnosis not present

## 2019-05-28 DIAGNOSIS — J441 Chronic obstructive pulmonary disease with (acute) exacerbation: Secondary | ICD-10-CM | POA: Diagnosis not present

## 2019-05-28 DIAGNOSIS — I2510 Atherosclerotic heart disease of native coronary artery without angina pectoris: Secondary | ICD-10-CM | POA: Diagnosis not present

## 2019-05-28 DIAGNOSIS — Z7984 Long term (current) use of oral hypoglycemic drugs: Secondary | ICD-10-CM | POA: Diagnosis not present

## 2019-05-28 DIAGNOSIS — G4700 Insomnia, unspecified: Secondary | ICD-10-CM | POA: Diagnosis not present

## 2019-05-28 DIAGNOSIS — J9620 Acute and chronic respiratory failure, unspecified whether with hypoxia or hypercapnia: Secondary | ICD-10-CM | POA: Diagnosis not present

## 2019-05-28 DIAGNOSIS — Z7982 Long term (current) use of aspirin: Secondary | ICD-10-CM | POA: Diagnosis not present

## 2019-05-28 DIAGNOSIS — Z9181 History of falling: Secondary | ICD-10-CM | POA: Diagnosis not present

## 2019-05-29 ENCOUNTER — Telehealth: Payer: Self-pay | Admitting: *Deleted

## 2019-05-29 DIAGNOSIS — J449 Chronic obstructive pulmonary disease, unspecified: Secondary | ICD-10-CM | POA: Diagnosis not present

## 2019-05-29 MED ORDER — UNABLE TO FIND: 0 refills | Status: DC

## 2019-05-29 NOTE — Telephone Encounter (Signed)
Pt called he had went by [facility] to get accessories for his nebulizer and they told him he needed a prescription sent in so he could get cord and accessories.

## 2019-05-29 NOTE — Telephone Encounter (Signed)
rx sent in for supplies

## 2019-06-01 ENCOUNTER — Encounter: Payer: Self-pay | Admitting: Family Medicine

## 2019-06-02 DIAGNOSIS — J449 Chronic obstructive pulmonary disease, unspecified: Secondary | ICD-10-CM | POA: Diagnosis not present

## 2019-06-04 ENCOUNTER — Telehealth: Payer: Self-pay | Admitting: *Deleted

## 2019-06-04 DIAGNOSIS — Z9181 History of falling: Secondary | ICD-10-CM | POA: Diagnosis not present

## 2019-06-04 DIAGNOSIS — I2510 Atherosclerotic heart disease of native coronary artery without angina pectoris: Secondary | ICD-10-CM | POA: Diagnosis not present

## 2019-06-04 DIAGNOSIS — I129 Hypertensive chronic kidney disease with stage 1 through stage 4 chronic kidney disease, or unspecified chronic kidney disease: Secondary | ICD-10-CM | POA: Diagnosis not present

## 2019-06-04 DIAGNOSIS — E785 Hyperlipidemia, unspecified: Secondary | ICD-10-CM | POA: Diagnosis not present

## 2019-06-04 DIAGNOSIS — J9620 Acute and chronic respiratory failure, unspecified whether with hypoxia or hypercapnia: Secondary | ICD-10-CM | POA: Diagnosis not present

## 2019-06-04 DIAGNOSIS — N183 Chronic kidney disease, stage 3 (moderate): Secondary | ICD-10-CM | POA: Diagnosis not present

## 2019-06-04 DIAGNOSIS — Z7984 Long term (current) use of oral hypoglycemic drugs: Secondary | ICD-10-CM | POA: Diagnosis not present

## 2019-06-04 DIAGNOSIS — Z7982 Long term (current) use of aspirin: Secondary | ICD-10-CM | POA: Diagnosis not present

## 2019-06-04 DIAGNOSIS — J441 Chronic obstructive pulmonary disease with (acute) exacerbation: Secondary | ICD-10-CM | POA: Diagnosis not present

## 2019-06-04 DIAGNOSIS — G4700 Insomnia, unspecified: Secondary | ICD-10-CM | POA: Diagnosis not present

## 2019-06-04 DIAGNOSIS — E1122 Type 2 diabetes mellitus with diabetic chronic kidney disease: Secondary | ICD-10-CM | POA: Diagnosis not present

## 2019-06-04 NOTE — Telephone Encounter (Signed)
I recommend ct OV in person later this week with Jarrett Soho, or if he is feeling too ill then [facility] emergency dept for Eval, I have no openings today, needs clinical eval

## 2019-06-04 NOTE — Telephone Encounter (Signed)
Patient aware and scheduled.

## 2019-06-04 NOTE — Telephone Encounter (Signed)
Levada Dy with [facility] called stating that she had gone out to see [name] and he had some crackles in the left lower lung and he was feeling very fatigued. She wanted to make [name] aware. Can be reached at [phone] and a voicemail can be left at this number it is confidential

## 2019-06-06 ENCOUNTER — Encounter: Payer: Self-pay | Admitting: Family Medicine

## 2019-06-06 ENCOUNTER — Ambulatory Visit (INDEPENDENT_AMBULATORY_CARE_PROVIDER_SITE_OTHER): Payer: Medicare Other | Admitting: Family Medicine

## 2019-06-06 ENCOUNTER — Other Ambulatory Visit: Payer: Self-pay

## 2019-06-06 VITALS — BP 116/60 | HR 98 | Temp 98.1°F | Resp 14 | Ht 73.0 in | Wt 239.0 lb

## 2019-06-06 DIAGNOSIS — J42 Unspecified chronic bronchitis: Secondary | ICD-10-CM

## 2019-06-06 DIAGNOSIS — I129 Hypertensive chronic kidney disease with stage 1 through stage 4 chronic kidney disease, or unspecified chronic kidney disease: Secondary | ICD-10-CM | POA: Diagnosis not present

## 2019-06-06 DIAGNOSIS — E1122 Type 2 diabetes mellitus with diabetic chronic kidney disease: Secondary | ICD-10-CM | POA: Diagnosis not present

## 2019-06-06 DIAGNOSIS — Z9181 History of falling: Secondary | ICD-10-CM | POA: Diagnosis not present

## 2019-06-06 DIAGNOSIS — E785 Hyperlipidemia, unspecified: Secondary | ICD-10-CM | POA: Diagnosis not present

## 2019-06-06 DIAGNOSIS — Z7984 Long term (current) use of oral hypoglycemic drugs: Secondary | ICD-10-CM | POA: Diagnosis not present

## 2019-06-06 DIAGNOSIS — J9620 Acute and chronic respiratory failure, unspecified whether with hypoxia or hypercapnia: Secondary | ICD-10-CM | POA: Diagnosis not present

## 2019-06-06 DIAGNOSIS — N183 Chronic kidney disease, stage 3 (moderate): Secondary | ICD-10-CM | POA: Diagnosis not present

## 2019-06-06 DIAGNOSIS — I2510 Atherosclerotic heart disease of native coronary artery without angina pectoris: Secondary | ICD-10-CM | POA: Diagnosis not present

## 2019-06-06 DIAGNOSIS — J441 Chronic obstructive pulmonary disease with (acute) exacerbation: Secondary | ICD-10-CM | POA: Diagnosis not present

## 2019-06-06 DIAGNOSIS — G4700 Insomnia, unspecified: Secondary | ICD-10-CM | POA: Diagnosis not present

## 2019-06-06 DIAGNOSIS — Z7982 Long term (current) use of aspirin: Secondary | ICD-10-CM | POA: Diagnosis not present

## 2019-06-06 NOTE — Progress Notes (Signed)
Subjective:     Patient ID: Matthew Molina, male   DOB: Feb 22, 1951, 68 y.o.   MRN: [id number]  Matthew Molina presents for follow up per Dr.Simpson (nurse from [facility] heard crackles in lower left lung. Pt states he is feeling better than he was the other day) Matthew Molina is a 68 year old male patient of [name].  He reports today in the office after having a [facility] nurse hear crackles in the lower left lung.  As he presents today he reports that he is feeling better than he was the day that she visited him.  Over the course of the last couple of months he has been treated multiple times for coughing, COPD exacerbation.  Today he reports that he is doing better denies having fever, chills, chest pain, shortness of breath (outside his baseline), cough, chest tightness, excessive wheezing.  He reports that he has been using his nebulizer as much as possible because he was instructed to do so.  This is in conjunction with using his rescue inhaler as well.  When asked specifically how he is taking his inhaler he reports that he has been taking his Symbicort daily, and using his nebulizer 4 or 5 times a day.  He reports that he uses it when he remembers not necessarily when he is coughing or short of breath or wheezy.  Past Medical, Surgical, Social History, Allergies, and Medications have been Reviewed.    Past Medical History:  Diagnosis Date  . Acute kidney injury ([place])   . Acute respiratory failure with hypoxia ([place])   . Arthritis   . COPD (chronic obstructive pulmonary disease) ([place])   . Depression   . Diabetes mellitus   . Diabetes mellitus without complication ([place])   . Hypercholesterolemia   . Hyperlipidemia   . Hypertension   . Multiple lung nodules on CT 04/02/2015  . Nicotine addiction   . Obesity   . Oxygen deficiency    qhs  . Schizophrenia [facility])    Past Surgical History:  Procedure Laterality Date  . CATARACT EXTRACTION W/PHACO Left 11/20/2013   Procedure: CATARACT EXTRACTION PHACO AND INTRAOCULAR LENS PLACEMENT (IOC);  Surgeon: Tonny Branch, MD;  Location: [facility] ORS;  Service: Ophthalmology;  Laterality: Left;  CDE:10.26  . CATARACT EXTRACTION W/PHACO Right 12/08/2013   Procedure: RIGHT EYE CATARACT EXTRACTION PHACO AND INTRAOCULAR LENS PLACEMENT ;  Surgeon: Tonny Branch, MD;  Location: [facility] ORS;  Service: Ophthalmology;  Laterality: Right;  CDE 12.38  . COLONOSCOPY N/A 04/01/2013   Procedure: COLONOSCOPY;  Surgeon: Danie Binder, MD;  Location: [facility] ENDO SUITE;  Service: Endoscopy;  Laterality: N/A;  10:00 AM-moved to [place] notified pt  . COLONOSCOPY  2014   INCOMPLETE PREP IN R COLON  . ESOPHAGOGASTRODUODENOSCOPY N/A 02/18/2016   Procedure: ESOPHAGOGASTRODUODENOSCOPY (EGD);  Surgeon: Danie Binder, MD;  Location: [facility] ENDO SUITE;  Service: Endoscopy;  Laterality: N/A;  1145  . EYE SURGERY Left 11/2013   cataract extraction  . SAVORY DILATION N/A 02/18/2016   Procedure: SAVORY DILATION;  Surgeon: Danie Binder, MD;  Location: [facility] ENDO SUITE;  Service: Endoscopy;  Laterality: N/A;  . SHOULDER SURGERY     Social History   Socioeconomic History  . Marital status: Divorced    Spouse name: Not on file  . Number of children: 2  . Years of education: Not on file  . Highest education level: Not on file  Occupational History  . Occupation: retired  from [employer]  . Financial resource strain: Not hard at all  . Food insecurity    Worry: Never true    Inability: Never true  . Transportation needs    Medical: No    Non-medical: No  Tobacco Use  . Smoking status: Current Every Day Smoker    Packs/day: 1.00    Years: 48.00    Pack years: 48.00    Types: Cigarettes    Last attempt to quit: 05/02/2017    Years since quitting: 2.0  . Smokeless tobacco: Never Used  Substance and Sexual Activity  . Alcohol use: Yes    Alcohol/week: 0.0 standard drinks  . Drug use: No  . Sexual activity: Not Currently    Birth  control/protection: None  Lifestyle  . Physical activity    Days per week: 0 days    Minutes per session: 0 min  . Stress: Not at all  Relationships  . Social Herbalist on phone: Twice a week    Gets together: Never    Attends religious service: More than 4 times per year    Active member of club or organization: No    Attends meetings of clubs or organizations: Never    Relationship status: Divorced  . Intimate partner violence    Fear of current or ex partner: No    Emotionally abused: No    Physically abused: No    Forced sexual activity: No  Other Topics Concern  . Not on file  Social History Narrative   ** Merged History Encounter **       ** Merged History Encounter **        Outpatient Encounter Medications as of 06/06/2019  Medication Sig  . ACCU-CHEK SOFTCLIX LANCETS lancets TEST BLOOD SUGAR ONCE DAILY AS DIRECTED.  Marland Kitchen acetaminophen (TYLENOL) 500 MG tablet Take 500-1,000 mg by mouth daily as needed. Takes mostly for HA about 2 x/week.  Marland Kitchen albuterol (PROAIR HFA) 108 (90 Base) MCG/ACT inhaler INHALE 2 PUFFS INTO THE LUNGS EVERY 6 HOURS AS NEEDED FOR WHEEZING ORSHORTNESS OF BREATH.  Marland Kitchen aspirin EC 81 MG tablet Take 81 mg by mouth daily.  . budesonide-formoterol (SYMBICORT) 80-4.5 MCG/ACT inhaler INHALE 2 PUFFS INTO LUNGS TWICE DAILY.  . busPIRone (BUSPAR) 7.5 MG tablet TAKE 1 TABLET BY MOUTH 3 TIMES DAILY.  Marland Kitchen GLIPIZIDE XL 5 MG 24 hr tablet TAKE 1 TABLET DAILY WITH BREAKFAST.  Marland Kitchen glucose blood (ACCU-CHEK AVIVA PLUS) test strip USE TO TEST ONCE DAILY  . ipratropium-albuterol (DUONEB) 0.5-2.5 (3) MG/3ML SOLN USE 1 VIAL(3 ML) IN NEBULIZER EVERY 4 TO 6 HOURS  . olmesartan (BENICAR) 40 MG tablet TAKE 1 TABLET BY MOUTH ONCE A DAY.  . pravastatin (PRAVACHOL) 20 MG tablet TAKE (1) TABLET BY MOUTH AT BEDTIME FOR CHOLESTEROL.  Marland Kitchen risperiDONE (RISPERDAL) 1 MG tablet TAKE (1) TABLET BY MOUTH AT BEDTIME WITH 4MG .  . risperidone (RISPERDAL) 4 MG tablet Takes with a 1 mg tablet at  night to = 5 mg dose  . tiZANidine (ZANAFLEX) 2 MG tablet One tablet once daily as needed , for muscle spasm (Patient taking differently: Take 2 mg by mouth. One tablet once daily as needed , for muscle spasm Pt. Takes everyday.)  . traZODone (DESYREL) 50 MG tablet Take 1 tablet (50 mg total) by mouth at bedtime as needed. for sleep  . triamterene-hydrochlorothiazide (MAXZIDE-25) 37.5-25 MG tablet Take 0.5 tablets by mouth daily.  Marland Kitchen UNABLE TO FIND Nebulizer mouthpiece and  tubing x 1  DX COPD  . [DISCONTINUED] sildenafil (VIAGRA) 100 MG tablet Take 100 mg by mouth. Take one tablet 30 mins before intercourse    No facility-administered encounter medications on file as of 06/06/2019.    Allergies  Allergen Reactions  . Sertraline Hcl     Stomach upset/pain  . Wellbutrin [Bupropion] Other (See Comments)    Makes stomach hurt    Review of Systems  Constitutional: Negative for chills and fever.  HENT: Negative.   Eyes: Negative.   Respiratory: Negative for cough and shortness of breath.   Cardiovascular: Negative for chest pain, palpitations and leg swelling.  Gastrointestinal: Negative.   Endocrine: Negative.   Genitourinary: Negative.   Musculoskeletal: Negative.   Skin: Negative.   Allergic/Immunologic: Negative.   Neurological: Negative.  Negative for dizziness and headaches.  Hematological: Negative.   Psychiatric/Behavioral: Negative.   All other systems reviewed and are negative.      Objective:     BP 116/60   Pulse 98   Temp 98.1 F (36.7 C) (Oral)   Resp 14   Ht 6\' 1"  (1.854 m)   Wt 239 lb 0.6 oz (108.4 kg)   SpO2 94%   BMI 31.54 kg/m   Physical Exam Vitals signs and nursing note reviewed.  Constitutional:      General: He is not in acute distress.    Appearance: Normal appearance. He is obese. He is not ill-appearing, toxic-appearing or diaphoretic.  HENT:     Head: Normocephalic and atraumatic.     Right Ear: External ear normal.     Left Ear:  External ear normal.     Nose: Nose normal.  Eyes:     General:        Right eye: No discharge.        Left eye: No discharge.     Conjunctiva/sclera: Conjunctivae normal.  Neck:     Musculoskeletal: Normal range of motion and neck supple.  Cardiovascular:     Rate and Rhythm: Normal rate and regular rhythm.     Pulses: Normal pulses.     Heart sounds: Normal heart sounds.  Pulmonary:     Effort: Pulmonary effort is normal. No tachypnea, bradypnea, accessory muscle usage, respiratory distress or retractions.     Breath sounds: Decreased air movement present. No stridor. Examination of the left-middle field reveals wheezing. Examination of the right-lower field reveals wheezing. Examination of the left-lower field reveals wheezing. Wheezing present. No rhonchi or rales.     Comments: Demonstrated mild expiratory wheezing in the bases, mid left.  Asked to cough and most of the wheezing and adventitious sounds cleared. Musculoskeletal: Normal range of motion.  Skin:    General: Skin is warm.     Capillary Refill: Capillary refill takes more than 3 seconds.  Neurological:     Mental Status: He is alert and oriented to person, place, and time.  Psychiatric:        Mood and Affect: Mood normal.        Behavior: Behavior normal.        Thought Content: Thought content normal.        Judgment: Judgment normal.        Assessment and Plan        1. Chronic bronchitis, unspecified chronic bronchitis type ([place]) Unsure if Matthew Molina understands and can remember the correct way to use his inhalers.  Reemphasized using his Symbicort daily as directed.  In addition to making sure his  pro-air rescue inhaler is with him when he is outside of his home.  And to use his nebulizers if he has a cough or shortness of breath that cannot be controlled with his rescue inhaler.  Or if he feels like he needs the nebulizer for it.  He reports that he might not remember this but he knows he is feeling okay at  this time.  Do not feel that an x-ray is needed at this time as he was able to clear some of the adventitious sounds with coughing.  Is not producing any mucus.  Cough and shortness of breath of his baseline he reports are is normal.  He does not have any of this in excess.  No fevers no chills here in the office no fevers or chills at home.  Educated on parameters for going to the emergency room over the weekend if he were to develop increased shortness of breath or trouble breathing.  He reported that he understood this verbalized that it was provided for him in written form on his after visit summary as well.  He is advised to follow-up with [place] on Monday or Tuesday if he does not feel well or feels like he is getting worse and did not need to feel like he needed to go to the emergency room.  Follow Up: as needed       Perlie Mayo, DNP, AGNP-BC [place], [place], [place] [address] Office Hours: Mon-Thurs 8 am-5 pm; Fri 8 am-12 pm Office Phone:  [phone]  Office Fax: [phone]

## 2019-06-06 NOTE — Patient Instructions (Addendum)
    Thank you for coming into the office today. I appreciate the opportunity to provide you with the care for your health and wellness. Today we discussed: breathing and lung sounds   IF YOU DEVELOP A FEVER, COUGH, SHORTNESS OF BREATH THAT INCREASES WITHOUT RELIEF FROM NEBS, OR YOU OVERALL START FEELING BAD PLEASE GO TO THE NEAREST EMERGENCY ROOM (ER).  Follow Up: Monday or Tuesday if needed.  Today you sound okay, some mild wheezing when you breath out, but some of that even cleared after you had a good cough.  You report taking nebs 4-5 times a day. I would not use the NEBS unless you need them. So if you feel short of breath or wheezing, or coughing a lot please use NEBS.  I am going to be reviewing your inhalers to see if we can change some around to help provide you with better control of your breathing overall.   [place] YOUR HANDS WELL AND FREQUENTLY. AVOID TOUCHING YOUR FACE, UNLESS YOUR HANDS ARE FRESHLY WASHED.  GET FRESH AIR DAILY. STAY HYDRATED WITH WATER.   It was a pleasure to see you and I look forward to continuing to work together on your health and well-being. Please do not hesitate to call the office if you need care or have questions about your care.  Have a wonderful day and week.  With Gratitude,  Cherly Beach, DNP, AGNP-BC

## 2019-06-09 ENCOUNTER — Other Ambulatory Visit: Payer: Self-pay

## 2019-06-09 NOTE — Patient Outreach (Signed)
[facility]) Care Management  06/09/2019  BRENNAN LITZINGER 1950-12-25 [id number]   Follow-up outreach. Mr. Gibbon reports doing well since our last conversation. Denies worsening shortness of breath. No complaints of pain or chest discomfort. Denies increased edema to his lower extremities. Reports taking medications as prescribed. Admits to not monitoring his blood glucose for several days.  Morning weight- 230lbs.  Mr. Defranco denies urgent concerns or immediate care management needs. Agreeable to follow-up within a week to discuss blood glucose levels.  PLAN -Will follow-up within a week.  [place] [phone]

## 2019-06-11 DIAGNOSIS — E1122 Type 2 diabetes mellitus with diabetic chronic kidney disease: Secondary | ICD-10-CM | POA: Diagnosis not present

## 2019-06-11 DIAGNOSIS — G4700 Insomnia, unspecified: Secondary | ICD-10-CM | POA: Diagnosis not present

## 2019-06-11 DIAGNOSIS — I129 Hypertensive chronic kidney disease with stage 1 through stage 4 chronic kidney disease, or unspecified chronic kidney disease: Secondary | ICD-10-CM | POA: Diagnosis not present

## 2019-06-11 DIAGNOSIS — I2510 Atherosclerotic heart disease of native coronary artery without angina pectoris: Secondary | ICD-10-CM | POA: Diagnosis not present

## 2019-06-11 DIAGNOSIS — E785 Hyperlipidemia, unspecified: Secondary | ICD-10-CM | POA: Diagnosis not present

## 2019-06-11 DIAGNOSIS — N183 Chronic kidney disease, stage 3 (moderate): Secondary | ICD-10-CM | POA: Diagnosis not present

## 2019-06-11 DIAGNOSIS — J9620 Acute and chronic respiratory failure, unspecified whether with hypoxia or hypercapnia: Secondary | ICD-10-CM | POA: Diagnosis not present

## 2019-06-11 DIAGNOSIS — Z9181 History of falling: Secondary | ICD-10-CM | POA: Diagnosis not present

## 2019-06-11 DIAGNOSIS — Z7984 Long term (current) use of oral hypoglycemic drugs: Secondary | ICD-10-CM | POA: Diagnosis not present

## 2019-06-11 DIAGNOSIS — J441 Chronic obstructive pulmonary disease with (acute) exacerbation: Secondary | ICD-10-CM | POA: Diagnosis not present

## 2019-06-11 DIAGNOSIS — Z7982 Long term (current) use of aspirin: Secondary | ICD-10-CM | POA: Diagnosis not present

## 2019-06-12 ENCOUNTER — Encounter: Payer: Self-pay | Admitting: *Deleted

## 2019-06-12 DIAGNOSIS — N183 Chronic kidney disease, stage 3 (moderate): Secondary | ICD-10-CM | POA: Diagnosis not present

## 2019-06-12 DIAGNOSIS — Z7982 Long term (current) use of aspirin: Secondary | ICD-10-CM | POA: Diagnosis not present

## 2019-06-12 DIAGNOSIS — Z7984 Long term (current) use of oral hypoglycemic drugs: Secondary | ICD-10-CM | POA: Diagnosis not present

## 2019-06-12 DIAGNOSIS — E1122 Type 2 diabetes mellitus with diabetic chronic kidney disease: Secondary | ICD-10-CM | POA: Diagnosis not present

## 2019-06-12 DIAGNOSIS — G4700 Insomnia, unspecified: Secondary | ICD-10-CM | POA: Diagnosis not present

## 2019-06-12 DIAGNOSIS — J9620 Acute and chronic respiratory failure, unspecified whether with hypoxia or hypercapnia: Secondary | ICD-10-CM | POA: Diagnosis not present

## 2019-06-12 DIAGNOSIS — I2510 Atherosclerotic heart disease of native coronary artery without angina pectoris: Secondary | ICD-10-CM | POA: Diagnosis not present

## 2019-06-12 DIAGNOSIS — Z9181 History of falling: Secondary | ICD-10-CM | POA: Diagnosis not present

## 2019-06-12 DIAGNOSIS — J441 Chronic obstructive pulmonary disease with (acute) exacerbation: Secondary | ICD-10-CM | POA: Diagnosis not present

## 2019-06-12 DIAGNOSIS — I129 Hypertensive chronic kidney disease with stage 1 through stage 4 chronic kidney disease, or unspecified chronic kidney disease: Secondary | ICD-10-CM | POA: Diagnosis not present

## 2019-06-12 DIAGNOSIS — E785 Hyperlipidemia, unspecified: Secondary | ICD-10-CM | POA: Diagnosis not present

## 2019-06-16 ENCOUNTER — Other Ambulatory Visit: Payer: Self-pay | Admitting: Family Medicine

## 2019-06-16 DIAGNOSIS — N183 Chronic kidney disease, stage 3 (moderate): Secondary | ICD-10-CM | POA: Diagnosis not present

## 2019-06-16 DIAGNOSIS — Z7982 Long term (current) use of aspirin: Secondary | ICD-10-CM | POA: Diagnosis not present

## 2019-06-16 DIAGNOSIS — E785 Hyperlipidemia, unspecified: Secondary | ICD-10-CM | POA: Diagnosis not present

## 2019-06-16 DIAGNOSIS — Z7984 Long term (current) use of oral hypoglycemic drugs: Secondary | ICD-10-CM | POA: Diagnosis not present

## 2019-06-16 DIAGNOSIS — J441 Chronic obstructive pulmonary disease with (acute) exacerbation: Secondary | ICD-10-CM | POA: Diagnosis not present

## 2019-06-16 DIAGNOSIS — E1122 Type 2 diabetes mellitus with diabetic chronic kidney disease: Secondary | ICD-10-CM | POA: Diagnosis not present

## 2019-06-16 DIAGNOSIS — Z9181 History of falling: Secondary | ICD-10-CM | POA: Diagnosis not present

## 2019-06-16 DIAGNOSIS — J449 Chronic obstructive pulmonary disease, unspecified: Secondary | ICD-10-CM

## 2019-06-16 DIAGNOSIS — I2510 Atherosclerotic heart disease of native coronary artery without angina pectoris: Secondary | ICD-10-CM | POA: Diagnosis not present

## 2019-06-16 DIAGNOSIS — J9620 Acute and chronic respiratory failure, unspecified whether with hypoxia or hypercapnia: Secondary | ICD-10-CM | POA: Diagnosis not present

## 2019-06-16 DIAGNOSIS — I129 Hypertensive chronic kidney disease with stage 1 through stage 4 chronic kidney disease, or unspecified chronic kidney disease: Secondary | ICD-10-CM | POA: Diagnosis not present

## 2019-06-16 DIAGNOSIS — G4700 Insomnia, unspecified: Secondary | ICD-10-CM | POA: Diagnosis not present

## 2019-06-17 ENCOUNTER — Other Ambulatory Visit: Payer: Self-pay

## 2019-06-17 DIAGNOSIS — Z7982 Long term (current) use of aspirin: Secondary | ICD-10-CM | POA: Diagnosis not present

## 2019-06-17 DIAGNOSIS — E785 Hyperlipidemia, unspecified: Secondary | ICD-10-CM | POA: Diagnosis not present

## 2019-06-17 DIAGNOSIS — I129 Hypertensive chronic kidney disease with stage 1 through stage 4 chronic kidney disease, or unspecified chronic kidney disease: Secondary | ICD-10-CM | POA: Diagnosis not present

## 2019-06-17 DIAGNOSIS — Z9181 History of falling: Secondary | ICD-10-CM | POA: Diagnosis not present

## 2019-06-17 DIAGNOSIS — I2510 Atherosclerotic heart disease of native coronary artery without angina pectoris: Secondary | ICD-10-CM | POA: Diagnosis not present

## 2019-06-17 DIAGNOSIS — G4700 Insomnia, unspecified: Secondary | ICD-10-CM | POA: Diagnosis not present

## 2019-06-17 DIAGNOSIS — J9620 Acute and chronic respiratory failure, unspecified whether with hypoxia or hypercapnia: Secondary | ICD-10-CM | POA: Diagnosis not present

## 2019-06-17 DIAGNOSIS — J441 Chronic obstructive pulmonary disease with (acute) exacerbation: Secondary | ICD-10-CM | POA: Diagnosis not present

## 2019-06-17 DIAGNOSIS — E1122 Type 2 diabetes mellitus with diabetic chronic kidney disease: Secondary | ICD-10-CM | POA: Diagnosis not present

## 2019-06-17 DIAGNOSIS — N183 Chronic kidney disease, stage 3 (moderate): Secondary | ICD-10-CM | POA: Diagnosis not present

## 2019-06-17 DIAGNOSIS — Z7984 Long term (current) use of oral hypoglycemic drugs: Secondary | ICD-10-CM | POA: Diagnosis not present

## 2019-06-17 NOTE — Patient Outreach (Signed)
[facility]) Care Management  06/17/2019  Matthew Molina [birth year] [id number]   Follow-up to discuss blood glucose levels. [name] FBS has ranged from 89 mg/dl to 138 mg/dl. He is attempting to decrease his intake of soft drinks but admits to drinking several sodas over the past week. He verbalized knowledge of s/sx of hypoglycemia and hyperglycemia. Denied symptoms since our last conversation. Encouraged to continue monitoring and recording his FBS levels.   Morning weight- 225lbs.  FBS- 137mg /dl.  PLAN -Will continue routine outreach.  [place] [phone]

## 2019-06-23 ENCOUNTER — Telehealth: Payer: Self-pay | Admitting: Family Medicine

## 2019-06-23 NOTE — Telephone Encounter (Signed)
Left message for Levada Dy to give verbal orders.

## 2019-06-23 NOTE — Telephone Encounter (Signed)
Needs Verbal orders [phone]

## 2019-06-24 NOTE — Telephone Encounter (Signed)
Left message for Matthew Molina to give verbal orders.

## 2019-06-25 DIAGNOSIS — J9620 Acute and chronic respiratory failure, unspecified whether with hypoxia or hypercapnia: Secondary | ICD-10-CM | POA: Diagnosis not present

## 2019-06-25 DIAGNOSIS — E1122 Type 2 diabetes mellitus with diabetic chronic kidney disease: Secondary | ICD-10-CM | POA: Diagnosis not present

## 2019-06-25 DIAGNOSIS — Z9981 Dependence on supplemental oxygen: Secondary | ICD-10-CM | POA: Diagnosis not present

## 2019-06-25 DIAGNOSIS — Z9181 History of falling: Secondary | ICD-10-CM | POA: Diagnosis not present

## 2019-06-25 DIAGNOSIS — G4700 Insomnia, unspecified: Secondary | ICD-10-CM | POA: Diagnosis not present

## 2019-06-25 DIAGNOSIS — Z7984 Long term (current) use of oral hypoglycemic drugs: Secondary | ICD-10-CM | POA: Diagnosis not present

## 2019-06-25 DIAGNOSIS — I129 Hypertensive chronic kidney disease with stage 1 through stage 4 chronic kidney disease, or unspecified chronic kidney disease: Secondary | ICD-10-CM | POA: Diagnosis not present

## 2019-06-25 DIAGNOSIS — I2510 Atherosclerotic heart disease of native coronary artery without angina pectoris: Secondary | ICD-10-CM | POA: Diagnosis not present

## 2019-06-25 DIAGNOSIS — J441 Chronic obstructive pulmonary disease with (acute) exacerbation: Secondary | ICD-10-CM | POA: Diagnosis not present

## 2019-06-25 DIAGNOSIS — E785 Hyperlipidemia, unspecified: Secondary | ICD-10-CM | POA: Diagnosis not present

## 2019-06-25 DIAGNOSIS — N183 Chronic kidney disease, stage 3 (moderate): Secondary | ICD-10-CM | POA: Diagnosis not present

## 2019-06-25 NOTE — Telephone Encounter (Signed)
Called Matthew Molina back letting her know I wasn't given the information in regards to what the verbal orders were for. Requested she call the office back.

## 2019-06-27 DIAGNOSIS — R531 Weakness: Secondary | ICD-10-CM | POA: Diagnosis not present

## 2019-06-27 DIAGNOSIS — R5381 Other malaise: Secondary | ICD-10-CM | POA: Diagnosis not present

## 2019-06-30 ENCOUNTER — Telehealth: Payer: Self-pay | Admitting: Family Medicine

## 2019-06-30 NOTE — Telephone Encounter (Signed)
Please send in the [employer] inhaler and or albuteral

## 2019-07-02 ENCOUNTER — Other Ambulatory Visit: Payer: Self-pay

## 2019-07-02 DIAGNOSIS — E1122 Type 2 diabetes mellitus with diabetic chronic kidney disease: Secondary | ICD-10-CM | POA: Diagnosis not present

## 2019-07-02 DIAGNOSIS — J9620 Acute and chronic respiratory failure, unspecified whether with hypoxia or hypercapnia: Secondary | ICD-10-CM | POA: Diagnosis not present

## 2019-07-02 DIAGNOSIS — I2510 Atherosclerotic heart disease of native coronary artery without angina pectoris: Secondary | ICD-10-CM | POA: Diagnosis not present

## 2019-07-02 DIAGNOSIS — E785 Hyperlipidemia, unspecified: Secondary | ICD-10-CM | POA: Diagnosis not present

## 2019-07-02 DIAGNOSIS — Z7984 Long term (current) use of oral hypoglycemic drugs: Secondary | ICD-10-CM | POA: Diagnosis not present

## 2019-07-02 DIAGNOSIS — N183 Chronic kidney disease, stage 3 (moderate): Secondary | ICD-10-CM | POA: Diagnosis not present

## 2019-07-02 DIAGNOSIS — Z9181 History of falling: Secondary | ICD-10-CM | POA: Diagnosis not present

## 2019-07-02 DIAGNOSIS — Z9981 Dependence on supplemental oxygen: Secondary | ICD-10-CM | POA: Diagnosis not present

## 2019-07-02 DIAGNOSIS — J449 Chronic obstructive pulmonary disease, unspecified: Secondary | ICD-10-CM | POA: Diagnosis not present

## 2019-07-02 DIAGNOSIS — I129 Hypertensive chronic kidney disease with stage 1 through stage 4 chronic kidney disease, or unspecified chronic kidney disease: Secondary | ICD-10-CM | POA: Diagnosis not present

## 2019-07-02 DIAGNOSIS — J441 Chronic obstructive pulmonary disease with (acute) exacerbation: Secondary | ICD-10-CM | POA: Diagnosis not present

## 2019-07-02 DIAGNOSIS — G4700 Insomnia, unspecified: Secondary | ICD-10-CM | POA: Diagnosis not present

## 2019-07-02 MED ORDER — ALBUTEROL SULFATE HFA 108 (90 BASE) MCG/ACT IN AERS: INHALATION_SPRAY | RESPIRATORY_TRACT | 5 refills | Status: DC

## 2019-07-02 NOTE — Telephone Encounter (Signed)
Albuterol sent

## 2019-07-07 ENCOUNTER — Other Ambulatory Visit: Payer: Self-pay | Admitting: Family Medicine

## 2019-07-07 ENCOUNTER — Telehealth: Payer: Self-pay | Admitting: Family Medicine

## 2019-07-07 ENCOUNTER — Other Ambulatory Visit: Payer: Self-pay

## 2019-07-07 DIAGNOSIS — J449 Chronic obstructive pulmonary disease, unspecified: Secondary | ICD-10-CM

## 2019-07-07 DIAGNOSIS — E1169 Type 2 diabetes mellitus with other specified complication: Secondary | ICD-10-CM

## 2019-07-07 DIAGNOSIS — E669 Obesity, unspecified: Secondary | ICD-10-CM

## 2019-07-07 MED ORDER — ACCU-CHEK AVIVA PLUS VI STRP: ORAL_STRIP | 5 refills | Status: DC

## 2019-07-07 MED ORDER — ACCU-CHEK SOFTCLIX LANCETS MISC: 5 refills | Status: DC

## 2019-07-07 NOTE — Telephone Encounter (Signed)
Strips and lancets refilled

## 2019-07-07 NOTE — Telephone Encounter (Signed)
Pt needs Accu Check

## 2019-07-08 ENCOUNTER — Telehealth: Payer: Self-pay | Admitting: Family Medicine

## 2019-07-08 NOTE — Telephone Encounter (Signed)
Pt needs a monitor for the test strips and lancets that was ordered

## 2019-07-09 ENCOUNTER — Telehealth: Payer: Self-pay | Admitting: *Deleted

## 2019-07-09 ENCOUNTER — Other Ambulatory Visit: Payer: Self-pay

## 2019-07-09 DIAGNOSIS — J441 Chronic obstructive pulmonary disease with (acute) exacerbation: Secondary | ICD-10-CM | POA: Diagnosis not present

## 2019-07-09 DIAGNOSIS — N183 Chronic kidney disease, stage 3 (moderate): Secondary | ICD-10-CM | POA: Diagnosis not present

## 2019-07-09 DIAGNOSIS — I10 Essential (primary) hypertension: Secondary | ICD-10-CM | POA: Diagnosis not present

## 2019-07-09 DIAGNOSIS — Z9181 History of falling: Secondary | ICD-10-CM | POA: Diagnosis not present

## 2019-07-09 DIAGNOSIS — E1122 Type 2 diabetes mellitus with diabetic chronic kidney disease: Secondary | ICD-10-CM | POA: Diagnosis not present

## 2019-07-09 DIAGNOSIS — I2510 Atherosclerotic heart disease of native coronary artery without angina pectoris: Secondary | ICD-10-CM | POA: Diagnosis not present

## 2019-07-09 DIAGNOSIS — J9620 Acute and chronic respiratory failure, unspecified whether with hypoxia or hypercapnia: Secondary | ICD-10-CM | POA: Diagnosis not present

## 2019-07-09 DIAGNOSIS — E1169 Type 2 diabetes mellitus with other specified complication: Secondary | ICD-10-CM | POA: Diagnosis not present

## 2019-07-09 DIAGNOSIS — Z9981 Dependence on supplemental oxygen: Secondary | ICD-10-CM | POA: Diagnosis not present

## 2019-07-09 DIAGNOSIS — Z7984 Long term (current) use of oral hypoglycemic drugs: Secondary | ICD-10-CM | POA: Diagnosis not present

## 2019-07-09 DIAGNOSIS — E785 Hyperlipidemia, unspecified: Secondary | ICD-10-CM | POA: Diagnosis not present

## 2019-07-09 DIAGNOSIS — G4700 Insomnia, unspecified: Secondary | ICD-10-CM | POA: Diagnosis not present

## 2019-07-09 DIAGNOSIS — I129 Hypertensive chronic kidney disease with stage 1 through stage 4 chronic kidney disease, or unspecified chronic kidney disease: Secondary | ICD-10-CM | POA: Diagnosis not present

## 2019-07-09 MED ORDER — ACCU-CHEK AVIVA PLUS W/DEVICE KIT: PACK | 0 refills | Status: DC

## 2019-07-09 NOTE — Telephone Encounter (Signed)
Pt called said his insurance company wrote him and said they were going to call him. They had a dr and nurse that would come out to the house to see him. He didn't know what to tell them when they called back to set it up as he does not want to lose [name] as his doctor. Wanted advice on what to tell them.

## 2019-07-09 NOTE — Telephone Encounter (Signed)
Meter sent in 

## 2019-07-10 ENCOUNTER — Telehealth: Payer: Self-pay | Admitting: *Deleted

## 2019-07-10 ENCOUNTER — Other Ambulatory Visit: Payer: Self-pay

## 2019-07-10 DIAGNOSIS — F209 Schizophrenia, unspecified: Secondary | ICD-10-CM

## 2019-07-10 LAB — COMPLETE METABOLIC PANEL WITH GFR
AG Ratio: 1.4 (calc) (ref 1.0–2.5)
ALT: 48 U/L — ABNORMAL HIGH (ref 9–46)
AST: 31 U/L (ref 10–35)
Albumin: 4.1 g/dL (ref 3.6–5.1)
Alkaline phosphatase (APISO): 46 U/L (ref 35–144)
BUN/Creatinine Ratio: 9 (calc) (ref 6–22)
BUN: 12 mg/dL (ref 7–25)
CO2: 27 mmol/L (ref 20–32)
Calcium: 10 mg/dL (ref 8.6–10.3)
Chloride: 98 mmol/L (ref 98–110)
Creat: 1.29 mg/dL — ABNORMAL HIGH (ref 0.70–1.25)
GFR, Est African American: 65 mL/min/{1.73_m2} (ref >=60)
GFR, Est Non African American: 56 mL/min/{1.73_m2} — ABNORMAL LOW (ref >=60)
Globulin: 2.9 g/dL (calc) (ref 1.9–3.7)
Glucose, Bld: 86 mg/dL (ref 65–99)
Potassium: 3.9 mmol/L (ref 3.5–5.3)
Sodium: 133 mmol/L — ABNORMAL LOW (ref 135–146)
Total Bilirubin: 0.5 mg/dL (ref 0.2–1.2)
Total Protein: 7 g/dL (ref 6.1–8.1)

## 2019-07-10 LAB — CBC
HCT: 41.9 % (ref 38.5–50.0)
Hemoglobin: 13.9 g/dL (ref 13.2–17.1)
MCH: 28.5 pg (ref 27.0–33.0)
MCHC: 33.2 g/dL (ref 32.0–36.0)
MCV: 86 fL (ref 80.0–100.0)
MPV: 9.7 fL (ref 7.5–12.5)
Platelets: 269 10*3/uL (ref 140–400)
RBC: 4.87 10*6/uL (ref 4.20–5.80)
RDW: 13.4 % (ref 11.0–15.0)
WBC: 5.9 10*3/uL (ref 3.8–10.8)

## 2019-07-10 LAB — LIPID PANEL
Cholesterol: 158 mg/dL (ref <200)
HDL: 36 mg/dL — ABNORMAL LOW (ref >=40)
LDL Cholesterol (Calc): 96 mg/dL (calc)
Non-HDL Cholesterol (Calc): 122 mg/dL (calc) (ref <130)
Total CHOL/HDL Ratio: 4.4 (calc) (ref <5.0)
Triglycerides: 161 mg/dL — ABNORMAL HIGH (ref <150)

## 2019-07-10 LAB — HEMOGLOBIN A1C
Hgb A1c MFr Bld: 6.8 % of total Hgb — ABNORMAL HIGH (ref <5.7)
Mean Plasma Glucose: 148 (calc)
eAG (mmol/L): 8.2 (calc)

## 2019-07-10 MED ORDER — RISPERIDONE 4 MG PO TABS: ORAL_TABLET | ORAL | 0 refills | Status: DC

## 2019-07-10 MED ORDER — RISPERIDONE 1 MG PO TABS: ORAL_TABLET | ORAL | 0 refills | Status: DC

## 2019-07-10 NOTE — Telephone Encounter (Signed)
Medication refilled and sent to pharmacy.

## 2019-07-10 NOTE — Telephone Encounter (Signed)
Please Recommend he continue care through  [place] [facility] , we are linked to his system  Of providers, thankful he wants to stay with [place]

## 2019-07-10 NOTE — Telephone Encounter (Signed)
Pt called said he called [facility] but he is about to be out of risperidone. Needs a refill called in

## 2019-07-10 NOTE — Telephone Encounter (Signed)
Called pt he said he loved this practice and he would let them know when they called. He appreciates all we do for him.

## 2019-07-14 DIAGNOSIS — J9620 Acute and chronic respiratory failure, unspecified whether with hypoxia or hypercapnia: Secondary | ICD-10-CM

## 2019-07-14 DIAGNOSIS — F209 Schizophrenia, unspecified: Secondary | ICD-10-CM

## 2019-07-14 DIAGNOSIS — F329 Major depressive disorder, single episode, unspecified: Secondary | ICD-10-CM

## 2019-07-14 DIAGNOSIS — I2510 Atherosclerotic heart disease of native coronary artery without angina pectoris: Secondary | ICD-10-CM

## 2019-07-14 DIAGNOSIS — J441 Chronic obstructive pulmonary disease with (acute) exacerbation: Secondary | ICD-10-CM

## 2019-07-14 DIAGNOSIS — E785 Hyperlipidemia, unspecified: Secondary | ICD-10-CM

## 2019-07-14 DIAGNOSIS — E1122 Type 2 diabetes mellitus with diabetic chronic kidney disease: Secondary | ICD-10-CM

## 2019-07-14 DIAGNOSIS — I129 Hypertensive chronic kidney disease with stage 1 through stage 4 chronic kidney disease, or unspecified chronic kidney disease: Secondary | ICD-10-CM

## 2019-07-14 DIAGNOSIS — N183 Chronic kidney disease, stage 3 (moderate): Secondary | ICD-10-CM | POA: Diagnosis not present

## 2019-07-14 DIAGNOSIS — F411 Generalized anxiety disorder: Secondary | ICD-10-CM

## 2019-07-15 ENCOUNTER — Ambulatory Visit (INDEPENDENT_AMBULATORY_CARE_PROVIDER_SITE_OTHER): Payer: Medicare Other | Admitting: Family Medicine

## 2019-07-15 ENCOUNTER — Ambulatory Visit: Payer: Medicare Other | Admitting: Family Medicine

## 2019-07-15 ENCOUNTER — Encounter: Payer: Self-pay | Admitting: Family Medicine

## 2019-07-15 ENCOUNTER — Other Ambulatory Visit: Payer: Self-pay

## 2019-07-15 ENCOUNTER — Telehealth: Payer: Self-pay

## 2019-07-15 ENCOUNTER — Other Ambulatory Visit: Payer: Self-pay | Admitting: Family Medicine

## 2019-07-15 ENCOUNTER — Encounter (INDEPENDENT_AMBULATORY_CARE_PROVIDER_SITE_OTHER): Payer: Self-pay

## 2019-07-15 VITALS — BP 138/84 | HR 98 | Temp 98.2°F | Resp 12 | Ht 73.0 in | Wt 235.0 lb

## 2019-07-15 DIAGNOSIS — M549 Dorsalgia, unspecified: Secondary | ICD-10-CM

## 2019-07-15 DIAGNOSIS — R2681 Unsteadiness on feet: Secondary | ICD-10-CM

## 2019-07-15 DIAGNOSIS — Z0000 Encounter for general adult medical examination without abnormal findings: Secondary | ICD-10-CM

## 2019-07-15 DIAGNOSIS — G8929 Other chronic pain: Secondary | ICD-10-CM

## 2019-07-15 NOTE — Telephone Encounter (Signed)
Referral entered for home health pt and faxed to [facility]

## 2019-07-15 NOTE — Telephone Encounter (Signed)
Patient requesting pt for back pain. Can we refer him to home health for this?

## 2019-07-15 NOTE — Patient Instructions (Signed)
Matthew Molina , Thank you for taking time to come for your Medicare Wellness Visit. I appreciate your ongoing commitment to your health goals. Please review the following plan we discussed and let me know if I can assist you in the future.   Please continue to practice social distancing to keep you, your family, and our community safe.  If you must go out, please wear a Mask and practice good handwashing.  Screening recommendations/referrals: Colonoscopy: Due 2024 Recommended yearly ophthalmology/optometry visit for glaucoma screening and checkup Recommended yearly dental visit for hygiene and checkup  Vaccinations: Influenza vaccine: Due Fall 2020 Pneumococcal vaccine: Completed Tdap vaccine: Due 2028 Shingles vaccine: Completed 2019 1 dose of Shingrix     Advanced directives: You declined this today  Conditions/risks identified: FALLS   Next appointment: 10/08/2019  Preventive Care 69 Years and Older, Male Preventive care refers to lifestyle choices and visits with your health care provider that can promote health and wellness. What does preventive care include?  A yearly physical exam. This is also called an annual well check.  Dental exams once or twice a year.  Routine eye exams. Ask your health care provider how often you should have your eyes checked.  Personal lifestyle choices, including:  Daily care of your teeth and gums.  Regular physical activity.  Eating a healthy diet.  Avoiding tobacco and drug use.  Limiting alcohol use.  Practicing safe sex.  Taking low doses of aspirin every day.  Taking vitamin and mineral supplements as recommended by your health care provider. What happens during an annual well check? The services and screenings done by your health care provider during your annual well check will depend on your age, overall health, lifestyle risk factors, and family history of disease. Counseling  Your health care provider may ask you questions  about your:  Alcohol use.  Tobacco use.  Drug use.  Emotional well-being.  Home and relationship well-being.  Sexual activity.  Eating habits.  History of falls.  Memory and ability to understand (cognition).  Work and work Statistician. Screening  You may have the following tests or measurements:  Height, weight, and BMI.  Blood pressure.  Lipid and cholesterol levels. These may be checked every 5 years, or more frequently if you are over 67 years old.  Skin check.  Lung cancer screening. You may have this screening every year starting at age 52 if you have a 30-pack-year history of smoking and currently smoke or have quit within the past 15 years.  Fecal occult blood test (FOBT) of the stool. You may have this test every year starting at age 4.  Flexible sigmoidoscopy or colonoscopy. You may have a sigmoidoscopy every 5 years or a colonoscopy every 10 years starting at age 42.  Prostate cancer screening. Recommendations will vary depending on your family history and other risks.  Hepatitis C blood test.  Hepatitis B blood test.  Sexually transmitted disease (STD) testing.  Diabetes screening. This is done by checking your blood sugar (glucose) after you have not eaten for a while (fasting). You may have this done every 1-3 years.  Abdominal aortic aneurysm (AAA) screening. You may need this if you are a current or former smoker.  Osteoporosis. You may be screened starting at age 8 if you are at high risk. Talk with your health care provider about your test results, treatment options, and if necessary, the need for more tests. Vaccines  Your health care provider may recommend certain vaccines, such as:  Influenza vaccine. This is recommended every year.  Tetanus, diphtheria, and acellular pertussis (Tdap, Td) vaccine. You may need a Td booster every 10 years.  Zoster vaccine. You may need this after age 85.  Pneumococcal 13-valent conjugate (PCV13)  vaccine. One dose is recommended after age 5.  Pneumococcal polysaccharide (PPSV23) vaccine. One dose is recommended after age 14. Talk to your health care provider about which screenings and vaccines you need and how often you need them. This information is not intended to replace advice given to you by your health care provider. Make sure you discuss any questions you have with your health care provider. Document Released: 12/31/2015 Document Revised: 08/23/2016 Document Reviewed: 10/05/2015 Elsevier Interactive Patient Education  2017 [place] Prevention in the Home Falls can cause injuries. They can happen to people of all ages. There are many things you can do to make your home safe and to help prevent falls. What can I do on the outside of my home?  Regularly fix the edges of walkways and driveways and fix any cracks.  Remove anything that might make you trip as you walk through a door, such as a raised step or threshold.  Trim any bushes or trees on the path to your home.  Use bright outdoor lighting.  Clear any walking paths of anything that might make someone trip, such as rocks or tools.  Regularly check to see if handrails are loose or broken. Make sure that both sides of any steps have handrails.  Any raised decks and porches should have guardrails on the edges.  Have any leaves, snow, or ice cleared regularly.  Use sand or salt on walking paths during winter.  Clean up any spills in your garage right away. This includes oil or grease spills. What can I do in the bathroom?  Use night lights.  Install grab bars by the toilet and in the tub and shower. Do not use towel bars as grab bars.  Use non-skid mats or decals in the tub or shower.  If you need to sit down in the shower, use a plastic, non-slip stool.  Keep the floor dry. Clean up any water that spills on the floor as soon as it happens.  Remove soap buildup in the tub or shower regularly.   Attach bath mats securely with double-sided non-slip rug tape.  Do not have throw rugs and other things on the floor that can make you trip. What can I do in the bedroom?  Use night lights.  Make sure that you have a light by your bed that is easy to reach.  Do not use any sheets or blankets that are too big for your bed. They should not hang down onto the floor.  Have a firm chair that has side arms. You can use this for support while you get dressed.  Do not have throw rugs and other things on the floor that can make you trip. What can I do in the kitchen?  Clean up any spills right away.  Avoid walking on wet floors.  Keep items that you use a lot in easy-to-reach places.  If you need to reach something above you, use a strong step stool that has a grab bar.  Keep electrical cords out of the way.  Do not use floor polish or wax that makes floors slippery. If you must use wax, use non-skid floor wax.  Do not have throw rugs and other things on the floor that  can make you trip. What can I do with my stairs?  Do not leave any items on the stairs.  Make sure that there are handrails on both sides of the stairs and use them. Fix handrails that are broken or loose. Make sure that handrails are as long as the stairways.  Check any carpeting to make sure that it is firmly attached to the stairs. Fix any carpet that is loose or worn.  Avoid having throw rugs at the top or bottom of the stairs. If you do have throw rugs, attach them to the floor with carpet tape.  Make sure that you have a light switch at the top of the stairs and the bottom of the stairs. If you do not have them, ask someone to add them for you. What else can I do to help prevent falls?  Wear shoes that:  Do not have high heels.  Have rubber bottoms.  Are comfortable and fit you well.  Are closed at the toe. Do not wear sandals.  If you use a stepladder:  Make sure that it is fully opened. Do not climb  a closed stepladder.  Make sure that both sides of the stepladder are locked into place.  Ask someone to hold it for you, if possible.  Clearly mark and make sure that you can see:  Any grab bars or handrails.  First and last steps.  Where the edge of each step is.  Use tools that help you move around (mobility aids) if they are needed. These include:  Canes.  Walkers.  Scooters.  Crutches.  Turn on the lights when you go into a dark area. Replace any light bulbs as soon as they burn out.  Set up your furniture so you have a clear path. Avoid moving your furniture around.  If any of your floors are uneven, fix them.  If there are any pets around you, be aware of where they are.  Review your medicines with your doctor. Some medicines can make you feel dizzy. This can increase your chance of falling. Ask your doctor what other things that you can do to help prevent falls. This information is not intended to replace advice given to you by your health care provider. Make sure you discuss any questions you have with your health care provider. Document Released: 09/30/2009 Document Revised: 05/11/2016 Document Reviewed: 01/08/2015 Elsevier Interactive Patient Education  2017 [employer].

## 2019-07-15 NOTE — Patient Outreach (Signed)
[facility]) Care Management  07/15/2019  Matthew Molina 1951/03/14 [id number]    Current Outpatient Medications on File Prior to Visit  Medication Sig Dispense Refill  . Accu-Chek Softclix Lancets lancets TEST BLOOD SUGAR ONCE DAILY AS DIRECTED. 100 each 5  . acetaminophen (TYLENOL) 500 MG tablet Take 500-1,000 mg by mouth daily as needed. Takes mostly for HA about 2 x/week.    Marland Kitchen albuterol (PROAIR HFA) 108 (90 Base) MCG/ACT inhaler INHALE 2 PUFFS INTO THE LUNGS EVERY 6 HOURS AS NEEDED FOR WHEEZING ORSHORTNESS OF BREATH. 8.5 g 5  . aspirin EC 81 MG tablet Take 81 mg by mouth daily.    . Blood Glucose Monitoring Suppl (ACCU-CHEK AVIVA PLUS) w/Device KIT accucheck aviva meter Once daily testing DX e11.9 1 kit 0  . budesonide-formoterol (SYMBICORT) 80-4.5 MCG/ACT inhaler INHALE 2 PUFFS INTO LUNGS TWICE DAILY. 10.2 g 0  . busPIRone (BUSPAR) 7.5 MG tablet TAKE 1 TABLET BY MOUTH 3 TIMES DAILY. 270 tablet 1  . GLIPIZIDE XL 5 MG 24 hr tablet TAKE 1 TABLET DAILY WITH BREAKFAST. 90 tablet 0  . glucose blood (ACCU-CHEK AVIVA PLUS) test strip USE TO TEST ONCE DAILY 100 each 5  . ipratropium-albuterol (DUONEB) 0.5-2.5 (3) MG/3ML SOLN USE 1 VIAL(3 ML) IN NEBULIZER EVERY 4 TO 6 HOURS 3 mL 3  . olmesartan (BENICAR) 40 MG tablet TAKE 1 TABLET BY MOUTH ONCE A DAY. 90 tablet 0  . pravastatin (PRAVACHOL) 20 MG tablet TAKE (1) TABLET BY MOUTH AT BEDTIME FOR CHOLESTEROL. 90 tablet 0  . risperiDONE (RISPERDAL) 1 MG tablet TAKE (1) TABLET BY MOUTH AT BEDTIME WITH '4MG'$ . 90 tablet 0  . risperidone (RISPERDAL) 4 MG tablet Takes with a 1 mg tablet at night to = 5 mg dose 90 tablet 0  . tiZANidine (ZANAFLEX) 2 MG tablet One tablet once daily as needed , for muscle spasm (Patient taking differently: Take 2 mg by mouth. One tablet once daily as needed , for muscle spasm Pt. Takes everyday.) 30 tablet 5  . traZODone (DESYREL) 50 MG tablet Take 1 tablet (50 mg total) by mouth at bedtime as needed. for sleep  90 tablet 1  . triamterene-hydrochlorothiazide (MAXZIDE-25) 37.5-25 MG tablet Take 0.5 tablets by mouth daily.    Marland Kitchen UNABLE TO FIND Nebulizer mouthpiece and tubing x 1  DX COPD 1 each 0  . [DISCONTINUED] sildenafil (VIAGRA) 100 MG tablet Take 100 mg by mouth. Take one tablet 30 mins before intercourse      No current facility-administered medications on file prior to visit.    [facility] CM Care Plan Problem One     Most Recent Value  Care Plan Problem One  Risk For Admission  Role Documenting the Problem One  Care Management [place] for Problem One  Active  [facility] Long Term Goal   Over the next 90 days patient will not require hospitalization or emergent care due to chronic disease related complications.  [facility] Long Term Goal Start Date  05/22/19  Interventions for Problem One Long Term Goal  Reviewed FBS ranges. Reports a reading of '137mg'$ /dl today. Discussed s/sx of hypoglycemia and hyperglycemia. Reviewed medications and contacted [facility] for needed refills. Discussed COPD "triggers" and measures to prevent exacerbation. Reviewed worsening s/sx that require immediate medical attention.  [Pending medication refills.]  [facility] CM Short Term Goal #1   Over the next 30 days patient will attend all provider appointments as scheduled.  [facility] CM Short Term Goal #1 Start  Date  05/22/19  [facility] CM Short Term Goal #1 Met Date  06/17/19  [facility] CM Short Term Goal #2   Over the next 30 days patient will participate in available counseling and verbalize plan for smoking cessation.  [facility] CM Short Term Goal #2 Start Date  05/22/19  [facility] CM Short Term Goal #3  Over the next 30 days patient will comply with daily blood glucose monitoring and record readings.  [facility] CM Short Term Goal #3 Start Date  05/22/19  [facility] CM Short Term Goal #3 Met Date  07/15/19  [facility] CM Short Term Goal #4  Over the next 30 days patient will avoid activities that lead to overexertion.  THN CM Short Term Goal #4 Start Date  05/22/19   [facility] CM Short Term Goal #4 Met Date  06/17/19      PLAN -Will continue routine outreach.   [place] [phone]

## 2019-07-15 NOTE — Telephone Encounter (Signed)
Yes, please do twice weekly for 6 weeks

## 2019-07-15 NOTE — Progress Notes (Addendum)
Subjective:   Matthew Molina is a 68 y.o. male who presents for Medicare Annual/Subsequent preventive examination.  Review of Systems:    Cardiac Risk Factors include: advanced age (>48mn, >>11women);diabetes mellitus;hypertension;male gender;obesity (BMI >30kg/m2)     Objective:    Vitals: BP 138/84   Pulse 98   Temp 98.2 F (36.8 C) (Oral)   Resp 12   Ht '6\' 1"'$  (1.854 m)   Wt 235 lb 0.6 oz (106.6 kg)   SpO2 95%   BMI 31.01 kg/m   Body mass index is 31.01 kg/m.  Advanced Directives 04/17/2019 04/17/2019 02/11/2019 01/31/2019 01/24/2019 01/19/2019 07/23/2018  Does Patient Have a Medical Advance Directive? No No No No No No No  Would patient like information on creating a medical advance directive? No - Patient declined - - No - Patient declined No - Patient declined - No - Patient declined  Pre-existing out of facility DNR order (yellow form or pink MOST form) - - - - - - -    Tobacco Social History   Tobacco Use  Smoking Status Current Every Day Smoker  . Packs/day: 1.00  . Years: 48.00  . Pack years: 48.00  . Types: Cigarettes  . Last attempt to quit: 05/02/2017  . Years since quitting: 2.2  Smokeless Tobacco Never Used     Ready to quit: No Counseling given: Yes   Clinical Intake:  Pre-visit preparation completed: Yes  Pain : No/denies pain Pain Score: 0-No pain     BMI - recorded: 31.01 Nutritional Status: BMI > 30  Obese Nutritional Risks: None Diabetes: Yes CBG done?: No Did pt. bring in CBG monitor from home?: No  How often do you need to have someone help you when you read instructions, pamphlets, or other written materials from your doctor or pharmacy?: 3 - Sometimes What is the last grade level you completed in school?: 12  Interpreter Needed?: No     Past Medical History:  Diagnosis Date  . [place] ([place]   . [place] with [place] ([place]   . Arthritis   . [place] disease) ([place]   .  Depression   . Diabetes mellitus   . Diabetes mellitus without complication ([place]   . Hypercholesterolemia   . Hyperlipidemia   . Hypertension   . Multiple lung nodules on CT 04/02/2015  . Nicotine addiction   . Obesity   . Oxygen deficiency    qhs  . Schizophrenia ([facility]    Past Surgical History:  Procedure Laterality Date  . CATARACT EXTRACTION W/PHACO Left 11/20/2013   Procedure: CATARACT EXTRACTION PHACO AND INTRAOCULAR LENS PLACEMENT (IOC);  Surgeon: KTonny Branch MD;  Location: [facility] ORS;  Service: Ophthalmology;  Laterality: Left;  CDE:10.26  . CATARACT EXTRACTION W/PHACO Right 12/08/2013   Procedure: RIGHT EYE CATARACT EXTRACTION PHACO AND INTRAOCULAR LENS PLACEMENT ;  Surgeon: KTonny Branch MD;  Location: [facility] ORS;  Service: Ophthalmology;  Laterality: Right;  CDE 12.38  . COLONOSCOPY N/A 04/01/2013   Procedure: COLONOSCOPY;  Surgeon: SDanie Binder MD;  Location: [facility] END[facility] SUITE;  Service: Endoscopy;  Laterality: N/A;  10:00 AM-moved to [place]  . COLONOSCOPY  2014   INCOMPLETE PREP IN R COLON  . ESOPHAGOGASTRODUODENOSCOPY N/A 02/18/2016   Procedure: ESOPHAGOGASTRODUODENOSCOPY (EGD);  Surgeon: SDanie Binder MD;  Location: [facility] SU[facility];  Service: Endoscopy;  Laterality: N/A;  1145  . EYE SURGERY Left 11/2013   cataract extraction  . SAVORY  DILATION N/A 02/18/2016   Procedure: SAVORY DILATION;  Surgeon: Danie Binder, MD;  Location: [facility] END[facility] SU[facility];  Service: Endoscopy;  Laterality: N/A;  . SHOULDER SURGERY     Family History  Problem Relation Age of Onset  . Diabetes Mother   . Hypertension Mother   . Stroke Mother   . Diabetes Sister   . Heart disease Sister   . Kidney disease Father   . Drug abuse Brother   . Colon cancer Neg Hx   . Colon polyps Neg Hx    Social History   Socioeconomic History  . Marital status: Divorced    Spouse name: Not on file  . Number of children: 2  . Years of education: Not on file  . Highest education level: Not on file   Occupational History  . Occupation: retired from [employer]  . Financial resource strain: Not hard at all  . Food insecurity    Worry: Never true    Inability: Never true  . Transportation needs    Medical: No    Non-medical: No  Tobacco Use  . Smoking status: Current Every Day Smoker    Packs/day: 1.00    Years: 48.00    Pack years: 48.00    Types: Cigarettes    Last attempt to quit: 05/02/2017    Years since quitting: 2.2  . Smokeless tobacco: Never Used  Substance and Sexual Activity  . Alcohol use: Yes    Alcohol/week: 0.0 standard drinks  . Drug use: No  . Sexual activity: Not Currently    Birth control/protection: None  Lifestyle  . Physical activity    Days per week: 0 days    Minutes per session: 0 min  . Stress: Not at all  Relationships  . Social Herbalist on phone: Twice a week    Gets together: Never    Attends religious service: More than 4 times per year    Active member of club or organization: No    Attends meetings of clubs or organizations: Never    Relationship status: Divorced  Other Topics Concern  . Not on file  Social History Narrative   ** Merged History Encounter **       ** Merged History Encounter **        Outpatient Encounter Medications as of 07/15/2019  Medication Sig  . Accu-Chek Softclix Lancets lancets TEST BLOOD SUGAR ONCE DAILY AS DIRECTED.  Marland Kitchen acetaminophen (TYLENOL) 500 MG tablet Take 500-1,000 mg by mouth daily as needed. Takes mostly for HA about 2 x/week.  Marland Kitchen albuterol (PROAIR HFA) 108 (90 Base) MCG/ACT inhaler INHALE 2 PUFFS INTO THE LUNGS EVERY 6 HOURS AS NEEDED FOR WHEEZING ORSHORTNESS OF BREATH.  Marland Kitchen aspirin EC 81 MG tablet Take 81 mg by mouth daily.  . Blood Glucose Monitoring Suppl (ACCU-CHEK AVIVA PLUS) w/Device KIT accucheck aviva meter Once daily testing DX e11.9  . budesonide-formoterol (SYMBICORT) 80-4.5 MCG/ACT inhaler INHALE 2 PUFFS INTO LUNGS TWICE DAILY.  . busPIRone (BUSPAR) 7.5 MG tablet  TAKE 1 TABLET BY MOUTH 3 TIMES DAILY.  Marland Kitchen GLIPIZIDE XL 5 MG 24 hr tablet TAKE 1 TABLET DAILY WITH BREAKFAST.  Marland Kitchen glucose blood (ACCU-CHEK AVIVA PLUS) test strip USE TO TEST ONCE DAILY  . ipratropium-albuterol (DUONEB) 0.5-2.5 (3) MG/3ML SOLN USE 1 VIAL(3 ML) IN NEBULIZER EVERY 4 TO 6 HOURS  . olmesartan (BENICAR) 40 MG tablet TAKE 1 TABLET BY MOUTH ONCE A DAY.  . pravastatin (PRAVACHOL)  20 MG tablet TAKE (1) TABLET BY MOUTH AT BEDTIME FOR CHOLESTEROL.  Marland Kitchen risperiDONE (RISPERDAL) 1 MG tablet TAKE (1) TABLET BY MOUTH AT BEDTIME WITH '4MG'$ .  . risperidone (RISPERDAL) 4 MG tablet Takes with a 1 mg tablet at night to = 5 mg dose  . tiZANidine (ZANAFLEX) 2 MG tablet One tablet once daily as needed , for muscle spasm (Patient taking differently: Take 2 mg by mouth. One tablet once daily as needed , for muscle spasm Pt. Takes everyday.)  . traZODone (DESYREL) 50 MG tablet Take 1 tablet (50 mg total) by mouth at bedtime as needed. for sleep  . triamterene-hydrochlorothiazide (MAXZIDE-25) 37.5-25 MG tablet Take 0.5 tablets by mouth daily.  Marland Kitchen UNABLE TO FIND Nebulizer mouthpiece and tubing x 1  DX COPD  . [DISCONTINUED] sildenafil (VIAGRA) 100 MG tablet Take 100 mg by mouth. Take one tablet 30 mins before intercourse    No facility-administered encounter medications on file as of 07/15/2019.     Activities of Daily Living In your present state of health, do you have any difficulty performing the following activities: 07/15/2019 04/17/2019  Hearing? Y Y  Comment a little in the left ear -  Vision? N Y  Difficulty concentrating or making decisions? Y N  Comment some -  Walking or climbing stairs? Y Y  Dressing or bathing? Y Y  Comment - -  Doing errands, shopping? Y Y  Comment - By phone.  Preparing Food and eating ? Y -  Comment - -  Using the Toilet? - -  In the past six months, have you accidently leaked urine? N -  Do you have problems with loss of bowel control? N -  Managing your Medications? N  -  Managing your Finances? N -  Housekeeping or managing your Housekeeping? Y -  Comment - -  Some recent data might be hidden    Patient Care Team: Fayrene Helper, MD as PCP - General (Family Medicine) Fay Records, MD as Consulting Physician (Cardiology) Fayrene Helper, MD Fayrene Helper, MD (Family Medicine) Danie Binder, MD as Consulting Physician (Gastroenterology) Gardiner Barefoot, DPM as Consulting Physician (Podiatry) Madelin Headings, DO (Optometry) Neldon Labella, RN as [facility] Irine Seal, MD as Attending Physician (Urology)   Assessment:   This is a routine wellness examination for Matthew Molina.  Exercise Activities and Dietary recommendations Current Exercise Habits: The patient does not participate in regular exercise at present, Exercise limited by: respiratory conditions(s)  Goals    . Exercise 3x per week (30 min per time)     Recommend starting a routine exercise program at least 3 days a week for 30-45 minutes at a time as tolerated.         Fall Risk Fall Risk  07/15/2019 06/06/2019 05/16/2019 04/28/2019 04/01/2019  Falls in the past year? '1 1 1 1 '$ 0  Number falls in past yr: '1 1 1 1 '$ 0  Injury with Fall? 0 0 0 1 0  Risk for fall due to : - - - - -  Risk for fall due to: Comment - - - - -  Follow up - - - - -   Is the patient's home free of loose throw rugs in walkways, pet beds, electrical cords, etc?   yes      Grab bars in the bathroom? yes      Handrails on the stairs?   yes      Adequate lighting?  yes     Depression Screen PHQ 2/9 Scores 07/15/2019 06/06/2019 05/16/2019 04/28/2019  PHQ - 2 Score 0 0 0 0  PHQ- 9 Score - - - -  Exception Documentation - - - -    Cognitive Function     6CIT Screen 07/15/2019 07/08/2018 05/09/2017  What Year? 0 points 0 points 0 points  What month? 0 points 0 points 0 points  What time? 0 points 0 points 0 points  Count back from 20 0 points 0 points 0 points  Months in  reverse 0 points 0 points 0 points  Repeat phrase 0 points 2 points 0 points  Total Score 0 2 0    Immunization History  Administered Date(s) Administered  . Influenza Split 09/02/2012  . Influenza Whole 09/19/2007, 09/23/2009, 10/07/2010, 08/28/2011, 09/02/2015  . Influenza, High Dose Seasonal PF 09/18/2018  . Influenza,inj,Quad PF,6+ Mos 09/02/2013, 11/30/2014, 08/30/2015, 09/27/2016, 12/04/2017  . Pneumococcal Conjugate-13 06/16/2014  . Pneumococcal Polysaccharide-23 06/28/2004, 08/27/2009, 04/17/2011, 05/04/2016  . Td 06/28/2004  . Zoster Recombinat (Shingrix) 06/04/2018    Qualifies for Shingles Vaccine? 2019  Screening Tests Health Maintenance  Topic Date Due  . INFLUENZA VACCINE  07/19/2019  . FOOT EXAM  09/19/2019  . OPHTHALMOLOGY EXAM  10/16/2019  . HEMOGLOBIN A1C  01/09/2020  . COLONOSCOPY  04/02/2023  . TETANUS/TDAP  02/17/2027  . Hepatitis C Screening  Completed  . PNA vac Low Risk Adult  Completed   Cancer Screenings: Lung: Low Dose CT Chest recommended if Age 68-80 years, 30 pack-year currently smoking OR have quit w/in 15years. Patient does not qualify. Colorectal:  Next 2024  Additional Screenings:  Hepatitis C Screening: completed       Plan:     1. Encounter for Medicare annual wellness exam   I have personally reviewed and noted the following in the patient's chart:   . Medical and social history . Use of alcohol, tobacco or illicit drugs  . Current medications and supplements . Functional ability and status . Nutritional status . Physical activity . Advanced directives . List of other physicians . Hospitalizations, surgeries, and ER visits in previous 12 months . Vitals . Screenings to include cognitive, depression, and falls . Referrals and appointments  In addition, I have reviewed and discussed with patient certain preventive protocols, quality metrics, and best practice recommendations. A written personalized care plan for preventive  services as well as general preventive health recommendations were provided to patient.     2. Unsteady gait Referral being made due to on going unsteady gait and chronic back pain. PT has helped in the past. Will order again.  3. Chronic back pain, unspecified back location, unspecified back pain laterality See above.    Perlie Mayo, NP  07/15/2019

## 2019-07-17 ENCOUNTER — Encounter: Payer: Medicare Other | Admitting: Family Medicine

## 2019-07-22 ENCOUNTER — Telehealth: Payer: Self-pay | Admitting: Family Medicine

## 2019-07-22 ENCOUNTER — Other Ambulatory Visit: Payer: Self-pay

## 2019-07-22 DIAGNOSIS — E1122 Type 2 diabetes mellitus with diabetic chronic kidney disease: Secondary | ICD-10-CM | POA: Diagnosis not present

## 2019-07-22 DIAGNOSIS — Z9181 History of falling: Secondary | ICD-10-CM | POA: Diagnosis not present

## 2019-07-22 DIAGNOSIS — J9620 Acute and chronic respiratory failure, unspecified whether with hypoxia or hypercapnia: Secondary | ICD-10-CM | POA: Diagnosis not present

## 2019-07-22 DIAGNOSIS — I129 Hypertensive chronic kidney disease with stage 1 through stage 4 chronic kidney disease, or unspecified chronic kidney disease: Secondary | ICD-10-CM | POA: Diagnosis not present

## 2019-07-22 DIAGNOSIS — Z7984 Long term (current) use of oral hypoglycemic drugs: Secondary | ICD-10-CM | POA: Diagnosis not present

## 2019-07-22 DIAGNOSIS — N183 Chronic kidney disease, stage 3 (moderate): Secondary | ICD-10-CM | POA: Diagnosis not present

## 2019-07-22 DIAGNOSIS — Z9981 Dependence on supplemental oxygen: Secondary | ICD-10-CM | POA: Diagnosis not present

## 2019-07-22 DIAGNOSIS — I2510 Atherosclerotic heart disease of native coronary artery without angina pectoris: Secondary | ICD-10-CM | POA: Diagnosis not present

## 2019-07-22 DIAGNOSIS — J441 Chronic obstructive pulmonary disease with (acute) exacerbation: Secondary | ICD-10-CM | POA: Diagnosis not present

## 2019-07-22 DIAGNOSIS — E785 Hyperlipidemia, unspecified: Secondary | ICD-10-CM | POA: Diagnosis not present

## 2019-07-22 DIAGNOSIS — G4700 Insomnia, unspecified: Secondary | ICD-10-CM | POA: Diagnosis not present

## 2019-07-22 MED ORDER — TIZANIDINE HCL 2 MG PO TABS: ORAL_TABLET | ORAL | 2 refills | Status: DC

## 2019-07-22 NOTE — Telephone Encounter (Signed)
tiZANidine Please send to [facility]

## 2019-07-22 NOTE — Telephone Encounter (Signed)
Medication refilled and sent to [facility] 

## 2019-07-23 DIAGNOSIS — I2510 Atherosclerotic heart disease of native coronary artery without angina pectoris: Secondary | ICD-10-CM | POA: Diagnosis not present

## 2019-07-23 DIAGNOSIS — E1122 Type 2 diabetes mellitus with diabetic chronic kidney disease: Secondary | ICD-10-CM | POA: Diagnosis not present

## 2019-07-23 DIAGNOSIS — Z7984 Long term (current) use of oral hypoglycemic drugs: Secondary | ICD-10-CM | POA: Diagnosis not present

## 2019-07-23 DIAGNOSIS — I129 Hypertensive chronic kidney disease with stage 1 through stage 4 chronic kidney disease, or unspecified chronic kidney disease: Secondary | ICD-10-CM | POA: Diagnosis not present

## 2019-07-23 DIAGNOSIS — N183 Chronic kidney disease, stage 3 (moderate): Secondary | ICD-10-CM | POA: Diagnosis not present

## 2019-07-23 DIAGNOSIS — E785 Hyperlipidemia, unspecified: Secondary | ICD-10-CM | POA: Diagnosis not present

## 2019-07-23 DIAGNOSIS — J9620 Acute and chronic respiratory failure, unspecified whether with hypoxia or hypercapnia: Secondary | ICD-10-CM | POA: Diagnosis not present

## 2019-07-23 DIAGNOSIS — J441 Chronic obstructive pulmonary disease with (acute) exacerbation: Secondary | ICD-10-CM | POA: Diagnosis not present

## 2019-07-23 DIAGNOSIS — Z9181 History of falling: Secondary | ICD-10-CM | POA: Diagnosis not present

## 2019-07-23 DIAGNOSIS — Z9981 Dependence on supplemental oxygen: Secondary | ICD-10-CM | POA: Diagnosis not present

## 2019-07-23 DIAGNOSIS — G4700 Insomnia, unspecified: Secondary | ICD-10-CM | POA: Diagnosis not present

## 2019-07-25 ENCOUNTER — Telehealth: Payer: Self-pay | Admitting: Family Medicine

## 2019-07-25 NOTE — Telephone Encounter (Signed)
Verbal orders please  Reestablish therapy

## 2019-07-30 DIAGNOSIS — E785 Hyperlipidemia, unspecified: Secondary | ICD-10-CM | POA: Diagnosis not present

## 2019-07-30 DIAGNOSIS — J9620 Acute and chronic respiratory failure, unspecified whether with hypoxia or hypercapnia: Secondary | ICD-10-CM | POA: Diagnosis not present

## 2019-07-30 DIAGNOSIS — I2510 Atherosclerotic heart disease of native coronary artery without angina pectoris: Secondary | ICD-10-CM | POA: Diagnosis not present

## 2019-07-30 DIAGNOSIS — N183 Chronic kidney disease, stage 3 (moderate): Secondary | ICD-10-CM | POA: Diagnosis not present

## 2019-07-30 DIAGNOSIS — Z7984 Long term (current) use of oral hypoglycemic drugs: Secondary | ICD-10-CM | POA: Diagnosis not present

## 2019-07-30 DIAGNOSIS — E1122 Type 2 diabetes mellitus with diabetic chronic kidney disease: Secondary | ICD-10-CM | POA: Diagnosis not present

## 2019-07-30 DIAGNOSIS — G4700 Insomnia, unspecified: Secondary | ICD-10-CM | POA: Diagnosis not present

## 2019-07-30 DIAGNOSIS — Z9981 Dependence on supplemental oxygen: Secondary | ICD-10-CM | POA: Diagnosis not present

## 2019-07-30 DIAGNOSIS — J441 Chronic obstructive pulmonary disease with (acute) exacerbation: Secondary | ICD-10-CM | POA: Diagnosis not present

## 2019-07-30 DIAGNOSIS — I129 Hypertensive chronic kidney disease with stage 1 through stage 4 chronic kidney disease, or unspecified chronic kidney disease: Secondary | ICD-10-CM | POA: Diagnosis not present

## 2019-07-30 DIAGNOSIS — Z9181 History of falling: Secondary | ICD-10-CM | POA: Diagnosis not present

## 2019-08-02 DIAGNOSIS — J449 Chronic obstructive pulmonary disease, unspecified: Secondary | ICD-10-CM | POA: Diagnosis not present

## 2019-08-06 DIAGNOSIS — J9620 Acute and chronic respiratory failure, unspecified whether with hypoxia or hypercapnia: Secondary | ICD-10-CM | POA: Diagnosis not present

## 2019-08-06 DIAGNOSIS — N183 Chronic kidney disease, stage 3 (moderate): Secondary | ICD-10-CM | POA: Diagnosis not present

## 2019-08-06 DIAGNOSIS — Z9181 History of falling: Secondary | ICD-10-CM | POA: Diagnosis not present

## 2019-08-06 DIAGNOSIS — I129 Hypertensive chronic kidney disease with stage 1 through stage 4 chronic kidney disease, or unspecified chronic kidney disease: Secondary | ICD-10-CM | POA: Diagnosis not present

## 2019-08-06 DIAGNOSIS — Z7984 Long term (current) use of oral hypoglycemic drugs: Secondary | ICD-10-CM | POA: Diagnosis not present

## 2019-08-06 DIAGNOSIS — G4700 Insomnia, unspecified: Secondary | ICD-10-CM | POA: Diagnosis not present

## 2019-08-06 DIAGNOSIS — I2510 Atherosclerotic heart disease of native coronary artery without angina pectoris: Secondary | ICD-10-CM | POA: Diagnosis not present

## 2019-08-06 DIAGNOSIS — Z9981 Dependence on supplemental oxygen: Secondary | ICD-10-CM | POA: Diagnosis not present

## 2019-08-06 DIAGNOSIS — E1122 Type 2 diabetes mellitus with diabetic chronic kidney disease: Secondary | ICD-10-CM | POA: Diagnosis not present

## 2019-08-06 DIAGNOSIS — E785 Hyperlipidemia, unspecified: Secondary | ICD-10-CM | POA: Diagnosis not present

## 2019-08-06 DIAGNOSIS — J441 Chronic obstructive pulmonary disease with (acute) exacerbation: Secondary | ICD-10-CM | POA: Diagnosis not present

## 2019-08-07 DIAGNOSIS — Z7984 Long term (current) use of oral hypoglycemic drugs: Secondary | ICD-10-CM | POA: Diagnosis not present

## 2019-08-07 DIAGNOSIS — N183 Chronic kidney disease, stage 3 (moderate): Secondary | ICD-10-CM | POA: Diagnosis not present

## 2019-08-07 DIAGNOSIS — Z9981 Dependence on supplemental oxygen: Secondary | ICD-10-CM | POA: Diagnosis not present

## 2019-08-07 DIAGNOSIS — E1122 Type 2 diabetes mellitus with diabetic chronic kidney disease: Secondary | ICD-10-CM | POA: Diagnosis not present

## 2019-08-07 DIAGNOSIS — Z9181 History of falling: Secondary | ICD-10-CM | POA: Diagnosis not present

## 2019-08-07 DIAGNOSIS — J9620 Acute and chronic respiratory failure, unspecified whether with hypoxia or hypercapnia: Secondary | ICD-10-CM | POA: Diagnosis not present

## 2019-08-07 DIAGNOSIS — I129 Hypertensive chronic kidney disease with stage 1 through stage 4 chronic kidney disease, or unspecified chronic kidney disease: Secondary | ICD-10-CM | POA: Diagnosis not present

## 2019-08-07 DIAGNOSIS — E785 Hyperlipidemia, unspecified: Secondary | ICD-10-CM | POA: Diagnosis not present

## 2019-08-07 DIAGNOSIS — J441 Chronic obstructive pulmonary disease with (acute) exacerbation: Secondary | ICD-10-CM | POA: Diagnosis not present

## 2019-08-07 DIAGNOSIS — I2510 Atherosclerotic heart disease of native coronary artery without angina pectoris: Secondary | ICD-10-CM | POA: Diagnosis not present

## 2019-08-07 DIAGNOSIS — G4700 Insomnia, unspecified: Secondary | ICD-10-CM | POA: Diagnosis not present

## 2019-08-11 ENCOUNTER — Other Ambulatory Visit: Payer: Self-pay

## 2019-08-11 NOTE — Patient Outreach (Signed)
[facility]) Care Management  08/11/2019  Matthew Molina 1951/02/18 [id number]   Routine outreach with Matthew Molina. He reports feeling good today. He continues to experience shortness of breath with exertion. Reports adequate relief with rest and breathing treatments. Denies shortness of breath at rest. Denies complaints of pain, chest discomfort or palpitations. Reports ambulating well and denies decline in activity tolerance. Denies falls.  He reports compliance with medications. Denies new concerns regarding adherence or prescription costs.  He admits to not monitoring his FBS daily but reports attempting to follow treatment recommendations. Reports a morning reading of '115mg'$ /dl.  He denies urgent concerns or changes in care management needs. Several smoking cessations resources were offered. He currently smokes a pack a day. Reports he is not ready to quit smoking but willing to gradually reduce his nicotine intake. Current Outpatient Medications on File Prior to Visit  Medication Sig Dispense Refill  . Accu-Chek Softclix Lancets lancets TEST BLOOD SUGAR ONCE DAILY AS DIRECTED. 100 each 5  . acetaminophen (TYLENOL) 500 MG tablet Take 500-1,000 mg by mouth daily as needed. Takes mostly for HA about 2 x/week.    Marland Kitchen albuterol (PROAIR HFA) 108 (90 Base) MCG/ACT inhaler INHALE 2 PUFFS INTO THE LUNGS EVERY 6 HOURS AS NEEDED FOR WHEEZING ORSHORTNESS OF BREATH. 8.5 g 5  . aspirin EC 81 MG tablet Take 81 mg by mouth daily.    . Blood Glucose Monitoring Suppl (ACCU-CHEK AVIVA PLUS) w/Device KIT accucheck aviva meter Once daily testing DX e11.9 1 kit 0  . budesonide-formoterol (SYMBICORT) 80-4.5 MCG/ACT inhaler INHALE 2 PUFFS INTO LUNGS TWICE DAILY. 10.2 g 0  . busPIRone (BUSPAR) 7.5 MG tablet TAKE 1 TABLET BY MOUTH 3 TIMES DAILY. 270 tablet 1  . GLIPIZIDE XL 5 MG 24 hr tablet TAKE 1 TABLET DAILY WITH BREAKFAST. 90 tablet 0  . glucose blood (ACCU-CHEK AVIVA PLUS) test strip USE TO  TEST ONCE DAILY 100 each 5  . ipratropium-albuterol (DUONEB) 0.5-2.5 (3) MG/3ML SOLN USE 1 VIAL(3 ML) IN NEBULIZER EVERY 4 TO 6 HOURS 3 mL 3  . olmesartan (BENICAR) 40 MG tablet TAKE 1 TABLET BY MOUTH ONCE A DAY. 90 tablet 0  . pravastatin (PRAVACHOL) 20 MG tablet TAKE (1) TABLET BY MOUTH AT BEDTIME FOR CHOLESTEROL. 90 tablet 0  . risperiDONE (RISPERDAL) 1 MG tablet TAKE (1) TABLET BY MOUTH AT BEDTIME WITH '4MG'$ . 90 tablet 0  . risperidone (RISPERDAL) 4 MG tablet Takes with a 1 mg tablet at night to = 5 mg dose 90 tablet 0  . tiZANidine (ZANAFLEX) 2 MG tablet One tablet once daily as needed , for muscle spasm 30 tablet 2  . traZODone (DESYREL) 50 MG tablet Take 1 tablet (50 mg total) by mouth at bedtime as needed. for sleep 90 tablet 1  . triamterene-hydrochlorothiazide (MAXZIDE-25) 37.5-25 MG tablet Take 0.5 tablets by mouth daily.    Marland Kitchen UNABLE TO FIND Nebulizer mouthpiece and tubing x 1  DX COPD 1 each 0  . [DISCONTINUED] sildenafil (VIAGRA) 100 MG tablet Take 100 mg by mouth. Take one tablet 30 mins before intercourse      No current facility-administered medications on file prior to visit.      PLAN -Will continue routine outreach.   [facility] Management [phone]

## 2019-08-12 ENCOUNTER — Other Ambulatory Visit: Payer: Self-pay | Admitting: Family Medicine

## 2019-08-12 DIAGNOSIS — Z9981 Dependence on supplemental oxygen: Secondary | ICD-10-CM | POA: Diagnosis not present

## 2019-08-12 DIAGNOSIS — E785 Hyperlipidemia, unspecified: Secondary | ICD-10-CM | POA: Diagnosis not present

## 2019-08-12 DIAGNOSIS — I129 Hypertensive chronic kidney disease with stage 1 through stage 4 chronic kidney disease, or unspecified chronic kidney disease: Secondary | ICD-10-CM | POA: Diagnosis not present

## 2019-08-12 DIAGNOSIS — J441 Chronic obstructive pulmonary disease with (acute) exacerbation: Secondary | ICD-10-CM | POA: Diagnosis not present

## 2019-08-12 DIAGNOSIS — I2510 Atherosclerotic heart disease of native coronary artery without angina pectoris: Secondary | ICD-10-CM | POA: Diagnosis not present

## 2019-08-12 DIAGNOSIS — N183 Chronic kidney disease, stage 3 (moderate): Secondary | ICD-10-CM | POA: Diagnosis not present

## 2019-08-12 DIAGNOSIS — Z7984 Long term (current) use of oral hypoglycemic drugs: Secondary | ICD-10-CM | POA: Diagnosis not present

## 2019-08-12 DIAGNOSIS — J9620 Acute and chronic respiratory failure, unspecified whether with hypoxia or hypercapnia: Secondary | ICD-10-CM | POA: Diagnosis not present

## 2019-08-12 DIAGNOSIS — Z9181 History of falling: Secondary | ICD-10-CM | POA: Diagnosis not present

## 2019-08-12 DIAGNOSIS — E1122 Type 2 diabetes mellitus with diabetic chronic kidney disease: Secondary | ICD-10-CM | POA: Diagnosis not present

## 2019-08-12 DIAGNOSIS — G4700 Insomnia, unspecified: Secondary | ICD-10-CM | POA: Diagnosis not present

## 2019-08-13 ENCOUNTER — Other Ambulatory Visit: Payer: Self-pay | Admitting: Family Medicine

## 2019-08-14 ENCOUNTER — Telehealth: Payer: Self-pay | Admitting: *Deleted

## 2019-08-14 ENCOUNTER — Other Ambulatory Visit: Payer: Self-pay

## 2019-08-14 DIAGNOSIS — E785 Hyperlipidemia, unspecified: Secondary | ICD-10-CM | POA: Diagnosis not present

## 2019-08-14 DIAGNOSIS — I2510 Atherosclerotic heart disease of native coronary artery without angina pectoris: Secondary | ICD-10-CM | POA: Diagnosis not present

## 2019-08-14 DIAGNOSIS — J9620 Acute and chronic respiratory failure, unspecified whether with hypoxia or hypercapnia: Secondary | ICD-10-CM | POA: Diagnosis not present

## 2019-08-14 DIAGNOSIS — Z9181 History of falling: Secondary | ICD-10-CM | POA: Diagnosis not present

## 2019-08-14 DIAGNOSIS — G4700 Insomnia, unspecified: Secondary | ICD-10-CM | POA: Diagnosis not present

## 2019-08-14 DIAGNOSIS — I129 Hypertensive chronic kidney disease with stage 1 through stage 4 chronic kidney disease, or unspecified chronic kidney disease: Secondary | ICD-10-CM | POA: Diagnosis not present

## 2019-08-14 DIAGNOSIS — N183 Chronic kidney disease, stage 3 (moderate): Secondary | ICD-10-CM | POA: Diagnosis not present

## 2019-08-14 DIAGNOSIS — Z9981 Dependence on supplemental oxygen: Secondary | ICD-10-CM | POA: Diagnosis not present

## 2019-08-14 DIAGNOSIS — Z7984 Long term (current) use of oral hypoglycemic drugs: Secondary | ICD-10-CM | POA: Diagnosis not present

## 2019-08-14 DIAGNOSIS — J441 Chronic obstructive pulmonary disease with (acute) exacerbation: Secondary | ICD-10-CM | POA: Diagnosis not present

## 2019-08-14 DIAGNOSIS — E1122 Type 2 diabetes mellitus with diabetic chronic kidney disease: Secondary | ICD-10-CM | POA: Diagnosis not present

## 2019-08-14 MED ORDER — TRAZODONE HCL 50 MG PO TABS: 50.0000 mg | ORAL_TABLET | Freq: Every evening | ORAL | 0 refills | Status: DC | PRN

## 2019-08-14 NOTE — Telephone Encounter (Signed)
His med was refilled 8/25. He just needs to call the pharamcy

## 2019-08-14 NOTE — Telephone Encounter (Signed)
Pt is out of his trazedone. This can be sent to [facility] he has been out for a couple of days.

## 2019-08-15 ENCOUNTER — Other Ambulatory Visit: Payer: Self-pay | Admitting: Family Medicine

## 2019-08-20 ENCOUNTER — Other Ambulatory Visit: Payer: Self-pay

## 2019-08-20 MED ORDER — IPRATROPIUM-ALBUTEROL 0.5-2.5 (3) MG/3ML IN SOLN: RESPIRATORY_TRACT | 1 refills | Status: DC

## 2019-08-21 ENCOUNTER — Other Ambulatory Visit: Payer: Self-pay | Admitting: *Deleted

## 2019-08-21 DIAGNOSIS — R6889 Other general symptoms and signs: Secondary | ICD-10-CM | POA: Diagnosis not present

## 2019-08-21 DIAGNOSIS — Z20822 Contact with and (suspected) exposure to covid-19: Secondary | ICD-10-CM

## 2019-08-22 LAB — NOVEL CORONAVIRUS, NAA: SARS-CoV-2, NAA: NOT DETECTED

## 2019-08-26 ENCOUNTER — Telehealth: Payer: Self-pay | Admitting: General Practice

## 2019-08-26 NOTE — Telephone Encounter (Signed)
Negative COVID results given. Patient results "NOT Detected." Caller expressed understanding. ° °

## 2019-08-27 ENCOUNTER — Other Ambulatory Visit: Payer: Self-pay | Admitting: Family Medicine

## 2019-08-27 DIAGNOSIS — J449 Chronic obstructive pulmonary disease, unspecified: Secondary | ICD-10-CM

## 2019-08-28 ENCOUNTER — Telehealth: Payer: Self-pay | Admitting: *Deleted

## 2019-08-28 NOTE — Telephone Encounter (Signed)
Pt called needing his refill on his cymbicort sent to [place] and he needs this delivered to his home

## 2019-08-28 NOTE — Telephone Encounter (Signed)
Symbicort was refilled yesterday and is being delivered today

## 2019-09-02 DIAGNOSIS — J449 Chronic obstructive pulmonary disease, unspecified: Secondary | ICD-10-CM | POA: Diagnosis not present

## 2019-09-03 ENCOUNTER — Ambulatory Visit: Payer: Self-pay

## 2019-09-09 ENCOUNTER — Other Ambulatory Visit: Payer: Self-pay | Admitting: Family Medicine

## 2019-09-09 DIAGNOSIS — J449 Chronic obstructive pulmonary disease, unspecified: Secondary | ICD-10-CM

## 2019-09-10 ENCOUNTER — Other Ambulatory Visit: Payer: Self-pay

## 2019-09-10 NOTE — Patient Outreach (Signed)
[facility]) Care Management  09/10/2019  HAM CONK 1951-05-10 [id number]    Attempted routine outreach with [name]. Left HIPAA compliant voice message requesting a return call.    PLAN -Will follow-up within 3-4 business days.   [place] [facility] [phone]

## 2019-09-16 ENCOUNTER — Other Ambulatory Visit: Payer: Self-pay

## 2019-09-16 NOTE — Patient Outreach (Signed)
[facility]) Care Management  09/16/2019  Matthew Molina July 09, 1951 [id number]    Successful outreach with Matthew Molina. He reports an occasional cough but otherwise doing well. Denies shortness of breath or chest discomfort.    He reports compliance with medications. He has not monitored his blood pressure or glucose levels for over a week. Reports he will start monitoring tomorrow and will maintain logs. He denies symptoms of hypoglycemia or hyperglycemia. He is aware of indications for seeking medical attention.   He is pending outreach with his primary care provider next month. Agreeable to follow-up after clinic visit to review medications and treatment recommendations. Will consider transition to [place] if he does not experience complications within the next month.  Current Outpatient Medications on File Prior to Visit  Medication Sig Dispense Refill  . Accu-Chek Softclix Lancets lancets TEST BLOOD SUGAR ONCE DAILY AS DIRECTED. 100 each 5  . acetaminophen (TYLENOL) 500 MG tablet Take 500-1,000 mg by mouth daily as needed. Takes mostly for HA about 2 x/week.    Marland Kitchen albuterol (PROAIR HFA) 108 (90 Base) MCG/ACT inhaler INHALE 2 PUFFS INTO THE LUNGS EVERY 6 HOURS AS NEEDED FOR WHEEZING ORSHORTNESS OF BREATH. 8.5 g 5  . aspirin EC 81 MG tablet Take 81 mg by mouth daily.    . Blood Glucose Monitoring Suppl (ACCU-CHEK AVIVA PLUS) w/Device KIT accucheck aviva meter Once daily testing DX e11.9 1 kit 0  . budesonide-formoterol (SYMBICORT) 80-4.5 MCG/ACT inhaler INHALE 2 PUFFS INTO LUNGS TWICE DAILY. 10.2 g 0  . busPIRone (BUSPAR) 7.5 MG tablet TAKE 1 TABLET BY MOUTH 3 TIMES DAILY. 270 tablet 1  . GLIPIZIDE XL 5 MG 24 hr tablet TAKE 1 TABLET DAILY WITH BREAKFAST. 90 tablet 0  . glucose blood (ACCU-CHEK AVIVA PLUS) test strip USE TO TEST ONCE DAILY 100 each 5  . ipratropium-albuterol (DUONEB) 0.5-2.5 (3) MG/3ML SOLN USE 1 VIAL BY NEBULIZATION EVERY 4 TO 6 HOURS 1620 mL 1   . olmesartan (BENICAR) 40 MG tablet TAKE 1 TABLET BY MOUTH ONCE A DAY. 90 tablet 0  . pravastatin (PRAVACHOL) 20 MG tablet TAKE (1) TABLET BY MOUTH AT BEDTIME FOR CHOLESTEROL. 90 tablet 0  . risperiDONE (RISPERDAL) 1 MG tablet TAKE (1) TABLET BY MOUTH AT BEDTIME WITH '4MG'$ . 90 tablet 0  . risperidone (RISPERDAL) 4 MG tablet Takes with a 1 mg tablet at night to = 5 mg dose 90 tablet 0  . tiZANidine (ZANAFLEX) 2 MG tablet One tablet once daily as needed , for muscle spasm 30 tablet 2  . traZODone (DESYREL) 50 MG tablet Take 1 tablet (50 mg total) by mouth at bedtime as needed. for sleep 90 tablet 0  . triamterene-hydrochlorothiazide (MAXZIDE-25) 37.5-25 MG tablet Take 0.5 tablets by mouth daily.    Marland Kitchen UNABLE TO FIND Nebulizer mouthpiece and tubing x 1  DX COPD 1 each 0  . [DISCONTINUED] sildenafil (VIAGRA) 100 MG tablet Take 100 mg by mouth. Take one tablet 30 mins before intercourse      No current facility-administered medications on file prior to visit.        [facility] CM Care Plan Problem One     Most Recent Value  Care Plan Problem One  Risk For Admission  Role Documenting the Problem One  Care Management [place] for Problem One  Active  THN Long Term Goal   Over the next 90 days patient will not require hospitalization or emergent care due to chronic disease  related complications.  [facility] Long Term Goal Start Date  05/22/19  [facility] Long Term Goal Met Date  09/16/19  [facility] CM Short Term Goal #1   Over the next 30 days patient will attend all provider appointments as scheduled.  [facility] CM Short Term Goal #1 Start Date  05/22/19  [facility] CM Short Term Goal #1 Met Date  06/17/19  [facility] CM Short Term Goal #2   Over the next 30 days patient will participate in available counseling and verbalize plan for smoking cessation.  [facility] CM Short Term Goal #2 Start Date  05/22/19  [facility] CM Short Term Goal #3  Over the next 30 days patient will comply with daily blood glucose monitoring and record readings.   [facility] CM Short Term Goal #3 Start Date  05/22/19  [facility] CM Short Term Goal #3 Met Date  07/15/19  [facility] CM Short Term Goal #4  Over the next 30 days patient will avoid activities that lead to overexertion.  [facility] CM Short Term Goal #4 Start Date  05/22/19  [facility] CM Short Term Goal #4 Met Date  06/17/19      PLAN -Will follow-up next month.   [place] Care Management [phone]

## 2019-09-24 ENCOUNTER — Other Ambulatory Visit: Payer: Self-pay | Admitting: Family Medicine

## 2019-09-24 DIAGNOSIS — J449 Chronic obstructive pulmonary disease, unspecified: Secondary | ICD-10-CM

## 2019-09-26 ENCOUNTER — Telehealth: Payer: Self-pay | Admitting: Family Medicine

## 2019-09-26 ENCOUNTER — Other Ambulatory Visit: Payer: Self-pay | Admitting: Family Medicine

## 2019-09-26 MED ORDER — PREDNISONE 5 MG (21) PO TBPK: 5.0000 mg | ORAL_TABLET | ORAL | 0 refills | Status: DC

## 2019-09-26 NOTE — Telephone Encounter (Signed)
Advised patient that medication has been sent in. 

## 2019-09-26 NOTE — Telephone Encounter (Signed)
Prednisone dose pack sent in pls let him know ([place])

## 2019-09-26 NOTE — Telephone Encounter (Signed)
Patient states he has developed a pretty bad cough and needs prednisone to knock it out. Has not been exposed to Covid that he is aware of.

## 2019-09-26 NOTE — Telephone Encounter (Signed)
Pt is calling in states that he needs some prednisone, the weather is changing

## 2019-09-29 ENCOUNTER — Ambulatory Visit: Payer: Medicare Other | Admitting: Family Medicine

## 2019-10-02 DIAGNOSIS — J449 Chronic obstructive pulmonary disease, unspecified: Secondary | ICD-10-CM | POA: Diagnosis not present

## 2019-10-08 ENCOUNTER — Other Ambulatory Visit: Payer: Self-pay

## 2019-10-08 ENCOUNTER — Encounter: Payer: Self-pay | Admitting: Family Medicine

## 2019-10-08 ENCOUNTER — Encounter (INDEPENDENT_AMBULATORY_CARE_PROVIDER_SITE_OTHER): Payer: Self-pay

## 2019-10-08 ENCOUNTER — Ambulatory Visit (INDEPENDENT_AMBULATORY_CARE_PROVIDER_SITE_OTHER): Payer: Medicare Other | Admitting: Family Medicine

## 2019-10-08 VITALS — BP 130/80 | HR 108 | Temp 98.5°F | Resp 15 | Ht 73.0 in | Wt 241.0 lb

## 2019-10-08 DIAGNOSIS — N3946 Mixed incontinence: Secondary | ICD-10-CM

## 2019-10-08 DIAGNOSIS — E785 Hyperlipidemia, unspecified: Secondary | ICD-10-CM

## 2019-10-08 DIAGNOSIS — Z23 Encounter for immunization: Secondary | ICD-10-CM | POA: Diagnosis not present

## 2019-10-08 DIAGNOSIS — F209 Schizophrenia, unspecified: Secondary | ICD-10-CM

## 2019-10-08 DIAGNOSIS — E1169 Type 2 diabetes mellitus with other specified complication: Secondary | ICD-10-CM

## 2019-10-08 DIAGNOSIS — J42 Unspecified chronic bronchitis: Secondary | ICD-10-CM

## 2019-10-08 DIAGNOSIS — M549 Dorsalgia, unspecified: Secondary | ICD-10-CM

## 2019-10-08 DIAGNOSIS — I10 Essential (primary) hypertension: Secondary | ICD-10-CM

## 2019-10-08 DIAGNOSIS — Z122 Encounter for screening for malignant neoplasm of respiratory organs: Secondary | ICD-10-CM

## 2019-10-08 DIAGNOSIS — E669 Obesity, unspecified: Secondary | ICD-10-CM

## 2019-10-08 DIAGNOSIS — R918 Other nonspecific abnormal finding of lung field: Secondary | ICD-10-CM

## 2019-10-08 DIAGNOSIS — Z720 Tobacco use: Secondary | ICD-10-CM

## 2019-10-08 DIAGNOSIS — R32 Unspecified urinary incontinence: Secondary | ICD-10-CM | POA: Insufficient documentation

## 2019-10-08 MED ORDER — TRIAMTERENE-HCTZ 37.5-25 MG PO TABS: 0.5000 | ORAL_TABLET | Freq: Every day | ORAL | 3 refills | Status: DC

## 2019-10-08 MED ORDER — RISPERIDONE 4 MG PO TABS: ORAL_TABLET | ORAL | 1 refills | Status: DC

## 2019-10-08 MED ORDER — UNABLE TO FIND: 11 refills | Status: DC

## 2019-10-08 NOTE — Progress Notes (Signed)
Matthew Molina     MRN: [id number]      DOB: December 23, 1950   HPI Matthew Molina is here for follow up and re-evaluation of chronic medical conditions, medication management and review of any available recent lab and radiology data.  Preventive health is updated, specifically  Cancer screening and Immunization.   Questions or concerns regarding consultations or procedures which the PT has had in the interim are  addressed. The PT denies any adverse reactions to current medications since the last visit.  Needs incontinence supplies for when he goes out of town esp long drives , wets himself, requests incontinence supplies Need handicap sticker renewal , has limited mobility due to arthritis as well as respiratory compromise  ROS Denies recent fever or chills. Denies sinus pressure, nasal congestion, ear pain or sore throat. Denies chest congestion, productive cough or wheezing. Denies chest pains, palpitations and leg swelling Denies abdominal pain, nausea, vomiting,diarrhea or constipation.   Denies dysuria, frequency, hesitancy c/o incontinence. C/o chronic joint pain, swelling and limitation in mobility. Denies headaches, seizures, numbness, or tingling. Denies uncontrolled depression, anxiety or insomnia. Denies skin break down or rash.   PE  BP 130/80   Pulse (!) 108   Temp 98.5 F (36.9 C) (Temporal)   Resp 15   Ht 6\' 1"  (1.854 m)   Wt 241 lb (109.3 kg)   SpO2 97%   BMI 31.80 kg/m   Patient alert and oriented and in no cardiopulmonary distress.  HEENT: No facial asymmetry, EOMI,     Neck supple .  Chest: scattered crackles and wheezes bilaterally, also reduced air entryCVS: S1, S2 no murmurs, no S3.Regular rate.  ABD: Soft non tender.   Ext: No edema  MS: Adequate ROM spine, shoulders, hips and knees.  Skin: Intact, no ulcerations or rash noted.  Psych: Good eye contact, normal affect. Memory intact not anxious or depressed appearing.  CNS: CN 2-12 intact, power,   normal throughout.no focal deficits noted.   Assessment & Plan   Urinary incontinence 3 month h/o incerweased difficulty holding urine with wetting  Accident, needs incontinence supply for once daily use  Diabetes mellitus type 2 in obese (HCC) Controlled, no change in medication Matthew Molina is reminded of the importance of commitment to daily physical activity for 30 minutes or more, as able and the need to limit carbohydrate intake to 30 to 60 grams per meal to help with blood sugar control.   The need to take medication as prescribed, test blood sugar as directed, and to call between visits if there is a concern that blood sugar is uncontrolled is also discussed.   Matthew Molina is reminded of the importance of daily foot exam, annual eye examination, and good blood sugar, blood pressure and cholesterol control.  Diabetic Labs Latest Ref Rng & Units 07/09/2019 04/18/2019 04/17/2019 01/31/2019 01/29/2019  HbA1c <5.7 % of total Hgb 6.8(H) - - - -  Microalbumin Not Estab. ug/mL - - - - -  Micro/Creat Ratio 0.0 - 30.0 mg/g creat - - - - -  Chol <200 mg/dL 158 - - - -  HDL > OR = 40 mg/dL 36(L) - - - -  Calc LDL mg/dL (calc) 96 - - - -  Triglycerides <150 mg/dL 161(H) - 88 - -  Creatinine 0.70 - 1.25 mg/dL 1.29(H) 1.39(H) 1.36(H) 1.29(H) 1.42(H)   BP/Weight 10/08/2019 07/15/2019 06/06/2019 05/26/2019 05/16/2019 05/01/2019 [id number]  Systolic BP [id number] 0000000 [id number] [id number] Q000111Q Q000111Q  Diastolic BP 80  84 60 72 74 64 64  Wt. (Lbs) 241 235.04 239.04 234 234.08 235 235  BMI 31.8 31.01 31.54 30.87 30.88 31 31  Some encounter information is confidential and restricted. Go to Review Flowsheets activity to see all data.   Foot/eye exam completion dates Latest Ref Rng & Units 10/15/2018 07/10/2016  Eye Exam No Retinopathy No Retinopathy -  Foot Form Completion - - Done        Hypertension Controlled, no change in medication DASH diet and commitment to daily physical activity for a minimum of 30 minutes  discussed and encouraged, as a part of hypertension management. The importance of attaining a healthy weight is also discussed.  BP/Weight 10/08/2019 07/15/2019 06/06/2019 05/26/2019 05/16/2019 05/01/2019 [id number]  Systolic BP [id number] 0000000 [id number] Q000111Q Q000111Q  Diastolic BP 80 84 60 72 74 64 64  Wt. (Lbs) 241 235.04 239.04 234 234.08 235 235  BMI 31.8 31.01 31.54 30.87 30.88 31 31  Some encounter information is confidential and restricted. Go to Review Flowsheets activity to see all data.       Multiple lung nodules on CT Updated chest scan needed  Tobacco abuse Asked:confirms currently smokes cigarettes, 1PPD Assess: Unwilling to quit and not  cutting back Advise: needs to QUIT to reduce risk of cancer, cardio and cerebrovascular disease Assist: counseled for 5 minutes and literature provided Arrange: follow up in 3 months   COPD (chronic obstructive pulmonary disease) ([place]) worsening due to nicotine, poor exercise tolerance which is worsening  Back pain with radiation Chronic and progressively worsening  Hyperlipidemia Hyperlipidemia:Low fat diet discussed and encouraged.   Lipid Panel  Lab Results  Component Value Date   CHOL 158 07/09/2019   HDL 36 (L) 07/09/2019   LDLCALC 96 07/09/2019   TRIG 161 (H) 07/09/2019   CHOLHDL 4.4 07/09/2019       Obesity (BMI 30.0-34.9)  Patient re-educated about  the importance of commitment to a  minimum of 150 minutes of exercise per week as able.  The importance of healthy food choices with portion control discussed, as well as eating regularly and within a 12 hour window most days. The need to choose "clean , green" food 50 to 75% of the time is discussed, as well as to make water the primary drink and set a goal of 64 ounces water daily.    Weight /BMI 10/08/2019 07/15/2019 06/06/2019  WEIGHT 241 lb 235 lb 0.6 oz 239 lb 0.6 oz  HEIGHT 6\' 1"  6\' 1"  6\' 1"   BMI 31.8 kg/m2 31.01 kg/m2 31.54 kg/m2  Some encounter information is  confidential and restricted. Go to Review Flowsheets activity to see all data.

## 2019-10-08 NOTE — Assessment & Plan Note (Signed)
Controlled, no change in medication Matthew Molina is reminded of the importance of commitment to daily physical activity for 30 minutes or more, as able and the need to limit carbohydrate intake to 30 to 60 grams per meal to help with blood sugar control.   The need to take medication as prescribed, test blood sugar as directed, and to call between visits if there is a concern that blood sugar is uncontrolled is also discussed.   Matthew Molina is reminded of the importance of daily foot exam, annual eye examination, and good blood sugar, blood pressure and cholesterol control.  Diabetic Labs Latest Ref Rng & Units 07/09/2019 04/18/2019 04/17/2019 01/31/2019 01/29/2019  HbA1c <5.7 % of total Hgb 6.8(H) - - - -  Microalbumin Not Estab. ug/mL - - - - -  Micro/Creat Ratio 0.0 - 30.0 mg/g creat - - - - -  Chol <200 mg/dL 158 - - - -  HDL > OR = 40 mg/dL 36(L) - - - -  Calc LDL mg/dL (calc) 96 - - - -  Triglycerides <150 mg/dL 161(H) - 88 - -  Creatinine 0.70 - 1.25 mg/dL 1.29(H) 1.39(H) 1.36(H) 1.29(H) 1.42(H)   BP/Weight 10/08/2019 07/15/2019 06/06/2019 05/26/2019 05/16/2019 05/01/2019 [id number]  Systolic BP [id number]-[id number] 0000000 [id number] [id number] Q000111Q Q000111Q  Diastolic BP 80 84 60 72 74 64 64  Wt. (Lbs) 241 235.04 239.04 234 234.08 235 235  BMI 31.8 31.01 31.54 30.87 30.88 31 31  Some encounter information is confidential and restricted. Go to Review Flowsheets activity to see all data.   Foot/eye exam completion dates Latest Ref Rng & Units 10/15/2018 07/10/2016  Eye Exam No Retinopathy No Retinopathy -  Foot Form Completion - - Done

## 2019-10-08 NOTE — Patient Instructions (Addendum)
Annual physical exam with MD end January, call if you need me sooner Please schedule chest scan  Flu vaccine today  You are referred to Podiatry in [place] if they take your insurance, need toenails cut  Handicap sticker today from office  Fasting lipid, cmp and EGFr 5 to 7 days before January follow up  You do qualify for diabetic shoes ( [place])  RX requesting once daily incontinence underwear will be sent to [place], will request that they deliver them     Health Risks of Smoking Smoking cigarettes is very bad for your health. Tobacco smoke has over 200 known poisons in it. It contains the poisonous gases nitrogen oxide and carbon monoxide. There are over 60 chemicals in tobacco smoke that cause cancer. Smoking is difficult to quit because a chemical in tobacco, called nicotine, causes addiction or dependence. When you smoke and inhale, nicotine is absorbed rapidly into the bloodstream through your lungs. Both inhaled and non-inhaled nicotine may be addictive. What are the risks of cigarette smoke? Cigarette smokers have an increased risk of many serious medical problems, including:  Lung cancer.  Lung disease, such as pneumonia, bronchitis, and emphysema.  Chest pain (angina) and heart attack because the heart is not getting enough oxygen.  Heart disease and peripheral blood vessel disease.  High blood pressure (hypertension).  Stroke.  Oral cancer, including cancer of the lip, mouth, or voice box.  Bladder cancer.  Pancreatic cancer.  Cervical cancer.  Pregnancy complications, including premature birth.  Stillbirths and smaller newborn babies, birth defects, and genetic damage to sperm.  Early menopause.  Lower estrogen level for women.  Infertility.  Facial wrinkles.  Blindness.  Increased risk of broken bones (fractures).  Senile dementia.  Stomach ulcers and internal bleeding.  Delayed wound healing and increased risk of complications during  surgery.  Even smoking lightly shortens your life expectancy by several years. Because of secondhand smoke exposure, children of smokers have an increased risk of the following:  Sudden infant death syndrome (SIDS).  Respiratory infections.  Lung cancer.  Heart disease.  Ear infections. What are the benefits of quitting? There are many health benefits of quitting smoking. Here are some of them:  Within days of quitting smoking, your risk of having a heart attack decreases, your blood flow improves, and your lung capacity improves. Blood pressure, pulse rate, and breathing patterns start returning to normal soon after quitting.  Within months, your lungs may clear up completely.  Quitting for 10 years reduces your risk of developing lung cancer and heart disease to almost that of a nonsmoker.  People who quit may see an improvement in their overall quality of life. How do I quit smoking?     Smoking is an addiction with both physical and psychological effects, and longtime habits can be hard to change. Your health care provider can recommend:  Programs and community resources, which may include group support, education, or talk therapy.  Prescription medicines to help reduce cravings.  Nicotine replacement products, such as patches, gum, and nasal sprays. Use these products only as directed. Do not replace cigarette smoking with electronic cigarettes, which are commonly called e-cigarettes. The safety of e-cigarettes is not known, and some may contain harmful chemicals.  A combination of two or more of these methods. Where to find more information  American Lung Association: www.lung.org  American Cancer Society: www.cancer.org Summary  Smoking cigarettes is very bad for your health. Cigarette smokers have an increased risk of many serious medical  problems, including several cancers, heart disease, and stroke.  Smoking is an addiction with both physical and psychological  effects, and longtime habits can be hard to change.  By stopping right away, you can greatly reduce the risk of medical problems for you and your family.  To help you quit smoking, your health care provider can recommend programs, community resources, prescription medicines, and nicotine replacement products such as patches, gum, and nasal sprays. This information is not intended to replace advice given to you by your health care provider. Make sure you discuss any questions you have with your health care provider. Document Released: 01/11/2005 Document Revised: 03/07/2018 Document Reviewed: 12/08/2016 Elsevier Patient Education  2020 [employer].

## 2019-10-08 NOTE — Assessment & Plan Note (Signed)
3 month h/o incerweased difficulty holding urine with wetting  Accident, needs incontinence supply for once daily use

## 2019-10-09 ENCOUNTER — Other Ambulatory Visit: Payer: Self-pay | Admitting: Family Medicine

## 2019-10-09 ENCOUNTER — Encounter: Payer: Self-pay | Admitting: Family Medicine

## 2019-10-09 DIAGNOSIS — J449 Chronic obstructive pulmonary disease, unspecified: Secondary | ICD-10-CM

## 2019-10-09 NOTE — Assessment & Plan Note (Signed)
Controlled, no change in medication DASH diet and commitment to daily physical activity for a minimum of 30 minutes discussed and encouraged, as a part of hypertension management. The importance of attaining a healthy weight is also discussed.  BP/Weight 10/08/2019 07/15/2019 06/06/2019 05/26/2019 05/16/2019 05/01/2019 [id number]  Systolic BP [id number] 0000000 [id number] Q000111Q Q000111Q  Diastolic BP 80 84 60 72 74 64 64  Wt. (Lbs) 241 235.04 239.04 234 234.08 235 235  BMI 31.8 31.01 31.54 30.87 30.88 31 31  Some encounter information is confidential and restricted. Go to Review Flowsheets activity to see all data.

## 2019-10-09 NOTE — Assessment & Plan Note (Signed)
Chronic and progressively worsening

## 2019-10-09 NOTE — Assessment & Plan Note (Signed)
Updated chest scan needed

## 2019-10-09 NOTE — Assessment & Plan Note (Signed)
worsening due to nicotine, poor exercise tolerance which is worsening

## 2019-10-09 NOTE — Assessment & Plan Note (Signed)
  Patient re-educated about  the importance of commitment to a  minimum of 150 minutes of exercise per week as able.  The importance of healthy food choices with portion control discussed, as well as eating regularly and within a 12 hour window most days. The need to choose "clean , green" food 50 to 75% of the time is discussed, as well as to make water the primary drink and set a goal of 64 ounces water daily.    Weight /BMI 10/08/2019 07/15/2019 06/06/2019  WEIGHT 241 lb 235 lb 0.6 oz 239 lb 0.6 oz  HEIGHT 6\' 1"  6\' 1"  6\' 1"   BMI 31.8 kg/m2 31.01 kg/m2 31.54 kg/m2  Some encounter information is confidential and restricted. Go to Review Flowsheets activity to see all data.

## 2019-10-09 NOTE — Assessment & Plan Note (Signed)
Hyperlipidemia:Low fat diet discussed and encouraged.   Lipid Panel  Lab Results  Component Value Date   CHOL 158 07/09/2019   HDL 36 (L) 07/09/2019   LDLCALC 96 07/09/2019   TRIG 161 (H) 07/09/2019   CHOLHDL 4.4 07/09/2019

## 2019-10-09 NOTE — Assessment & Plan Note (Signed)
Asked:confirms currently smokes cigarettes, 1PPD Assess: Unwilling to quit and not  cutting back Advise: needs to QUIT to reduce risk of cancer, cardio and cerebrovascular disease Assist: counseled for 5 minutes and literature provided Arrange: follow up in 3 months

## 2019-10-14 ENCOUNTER — Telehealth: Payer: Self-pay | Admitting: *Deleted

## 2019-10-14 ENCOUNTER — Ambulatory Visit: Payer: Medicare Other | Admitting: Podiatry

## 2019-10-14 DIAGNOSIS — J42 Unspecified chronic bronchitis: Secondary | ICD-10-CM

## 2019-10-14 DIAGNOSIS — J449 Chronic obstructive pulmonary disease, unspecified: Secondary | ICD-10-CM | POA: Diagnosis not present

## 2019-10-14 DIAGNOSIS — J441 Chronic obstructive pulmonary disease with (acute) exacerbation: Secondary | ICD-10-CM

## 2019-10-14 MED ORDER — UNABLE TO FIND: 0 refills | Status: DC

## 2019-10-14 NOTE — Telephone Encounter (Signed)
rx sent

## 2019-10-14 NOTE — Telephone Encounter (Signed)
Pt called said he broke the cup that goes on his nebulizer to put the medicine in. He needs a new one to go to [facility]

## 2019-10-15 ENCOUNTER — Other Ambulatory Visit: Payer: Self-pay

## 2019-10-24 ENCOUNTER — Other Ambulatory Visit: Payer: Self-pay

## 2019-10-24 MED ORDER — IPRATROPIUM-ALBUTEROL 0.5-2.5 (3) MG/3ML IN SOLN: RESPIRATORY_TRACT | 1 refills | Status: DC

## 2019-10-28 ENCOUNTER — Other Ambulatory Visit: Payer: Self-pay | Admitting: Family Medicine

## 2019-11-02 DIAGNOSIS — J449 Chronic obstructive pulmonary disease, unspecified: Secondary | ICD-10-CM | POA: Diagnosis not present

## 2019-11-04 ENCOUNTER — Other Ambulatory Visit: Payer: Self-pay

## 2019-11-04 DIAGNOSIS — Z20822 Contact with and (suspected) exposure to covid-19: Secondary | ICD-10-CM

## 2019-11-05 ENCOUNTER — Telehealth: Payer: Self-pay | Admitting: *Deleted

## 2019-11-05 NOTE — Telephone Encounter (Signed)
Patient is calling to see if results for COVID test are in- notified results still pending. Patient states he will call back tomorrow.

## 2019-11-05 NOTE — Telephone Encounter (Signed)
Pt called in to check his covid 19 test result. Results not available at this time. He voiced understranding.

## 2019-11-06 ENCOUNTER — Other Ambulatory Visit: Payer: Self-pay | Admitting: Family Medicine

## 2019-11-06 ENCOUNTER — Telehealth: Payer: Self-pay | Admitting: *Deleted

## 2019-11-06 LAB — NOVEL CORONAVIRUS, NAA: SARS-CoV-2, NAA: NOT DETECTED

## 2019-11-06 NOTE — Telephone Encounter (Signed)
Patient called and was given NEGATIVE COVID results . 

## 2019-11-07 ENCOUNTER — Ambulatory Visit (HOSPITAL_COMMUNITY)
Admission: RE | Admit: 2019-11-07 | Discharge: 2019-11-07 | Disposition: A | Discharge status: Home / Self Care | Payer: Medicare Other | Source: Ambulatory Visit | Attending: Family Medicine | Admitting: Family Medicine

## 2019-11-07 ENCOUNTER — Other Ambulatory Visit: Payer: Self-pay

## 2019-11-07 DIAGNOSIS — I2510 Atherosclerotic heart disease of native coronary artery without angina pectoris: Secondary | ICD-10-CM | POA: Diagnosis not present

## 2019-11-07 DIAGNOSIS — K449 Diaphragmatic hernia without obstruction or gangrene: Secondary | ICD-10-CM | POA: Diagnosis not present

## 2019-11-07 DIAGNOSIS — F17210 Nicotine dependence, cigarettes, uncomplicated: Secondary | ICD-10-CM | POA: Diagnosis not present

## 2019-11-07 DIAGNOSIS — Z122 Encounter for screening for malignant neoplasm of respiratory organs: Secondary | ICD-10-CM | POA: Insufficient documentation

## 2019-11-07 DIAGNOSIS — J439 Emphysema, unspecified: Secondary | ICD-10-CM | POA: Insufficient documentation

## 2019-11-25 ENCOUNTER — Other Ambulatory Visit: Payer: Self-pay | Admitting: Family Medicine

## 2019-11-25 ENCOUNTER — Telehealth: Payer: Self-pay | Admitting: *Deleted

## 2019-11-25 DIAGNOSIS — J449 Chronic obstructive pulmonary disease, unspecified: Secondary | ICD-10-CM

## 2019-11-25 NOTE — Telephone Encounter (Signed)
Symbicort has been sent to the pharmacy

## 2019-11-25 NOTE — Telephone Encounter (Signed)
Pt called he is out of symbicort and needs this sent into the pharmacy

## 2019-12-02 DIAGNOSIS — J449 Chronic obstructive pulmonary disease, unspecified: Secondary | ICD-10-CM | POA: Diagnosis not present

## 2019-12-11 IMAGING — DX DG CHEST 2V
2 series · 2 of 2 positions shown · non-contrast
Comparison: Radiographs January 24, 2019.

CLINICAL DATA: Shortness of breath, cough.

EXAM:
CHEST - 2 VIEW

[chest pa]
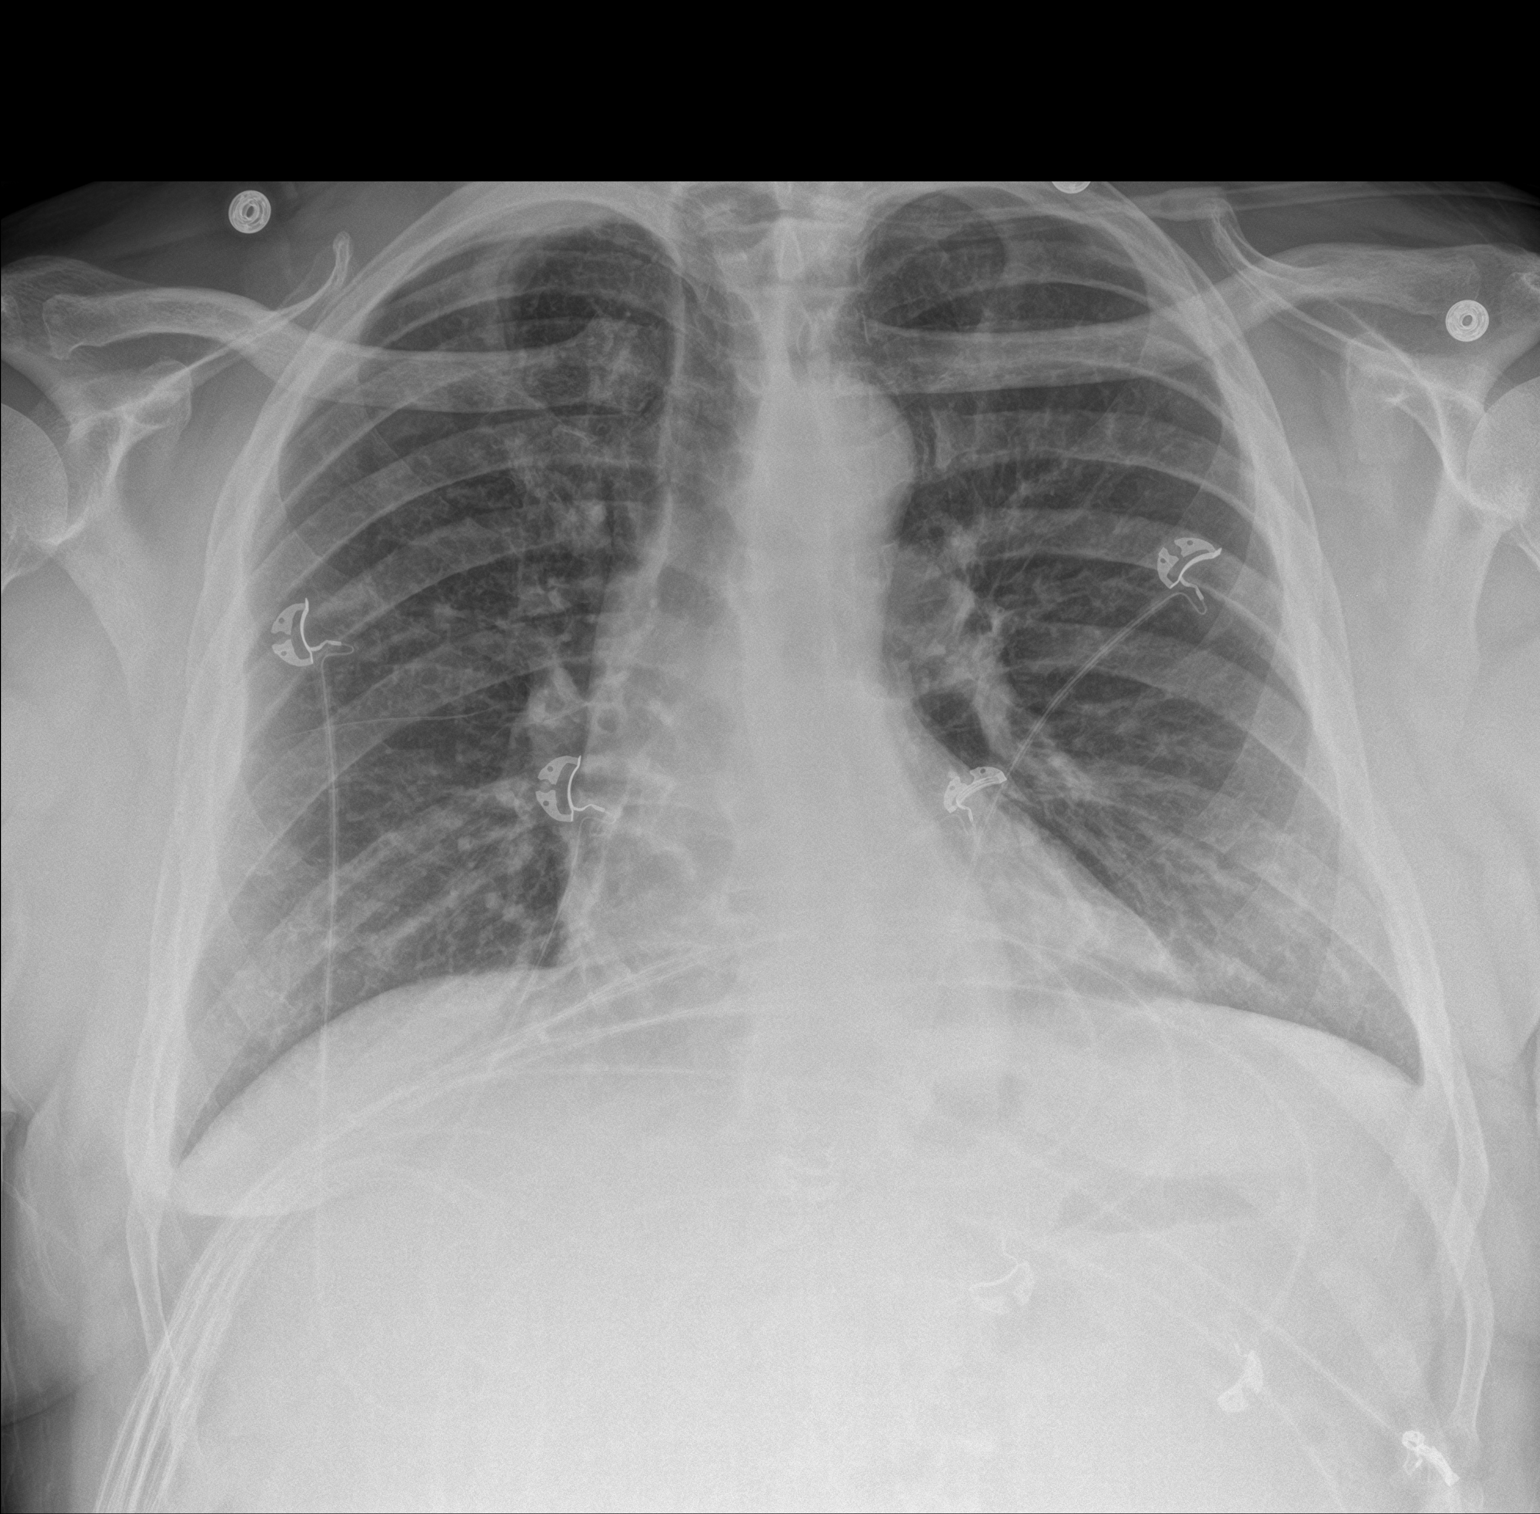

[chest lat]
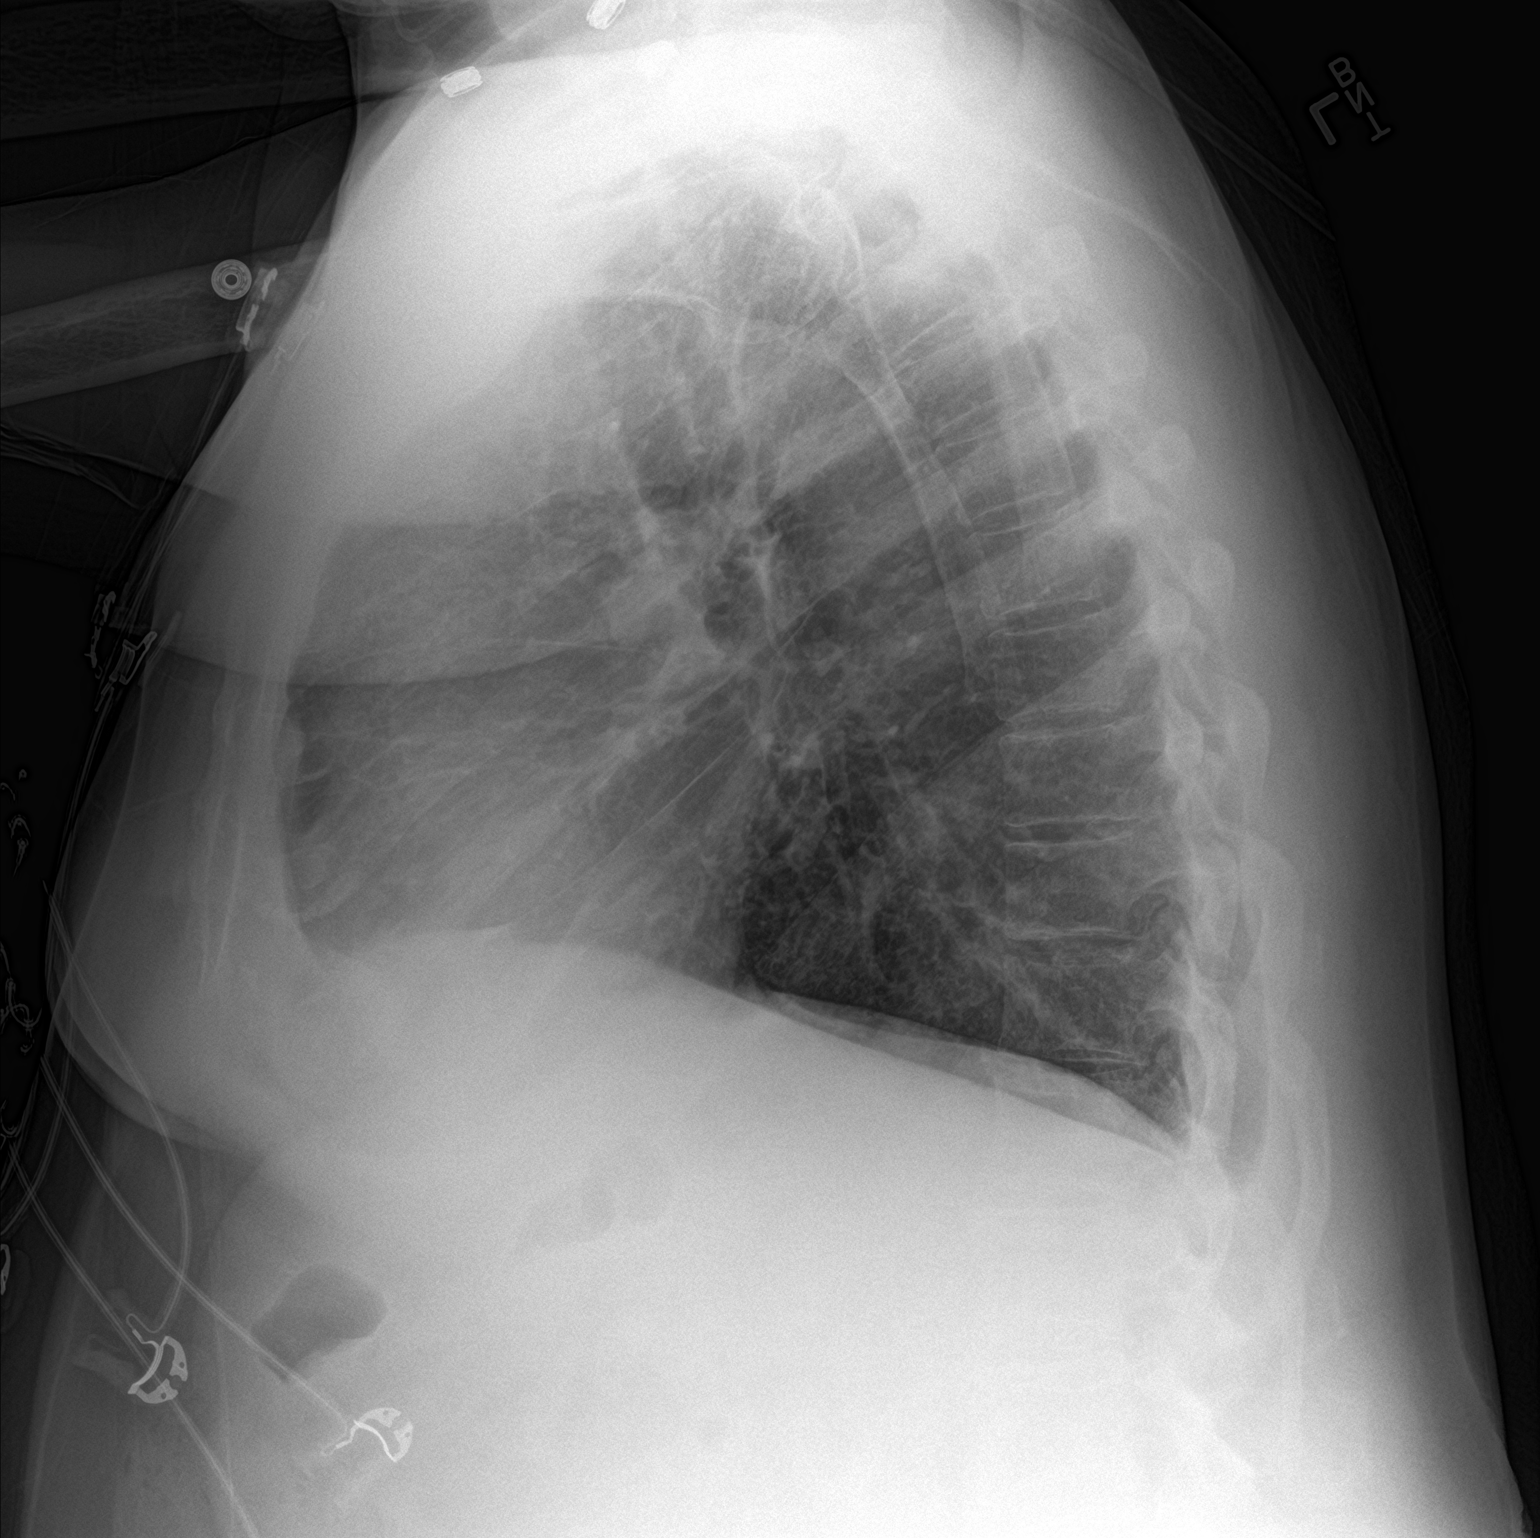

[2 of 2 positions shown; findings below may reference images not displayed]

FINDINGS: The heart size and mediastinal contours are within normal limits.
Both lungs are clear. No pneumothorax or pleural effusion is noted.
The visualized skeletal structures are unremarkable.
IMPRESSION: No active cardiopulmonary disease.

## 2019-12-23 ENCOUNTER — Other Ambulatory Visit: Payer: Self-pay | Admitting: Family Medicine

## 2019-12-23 DIAGNOSIS — J449 Chronic obstructive pulmonary disease, unspecified: Secondary | ICD-10-CM

## 2020-01-02 ENCOUNTER — Ambulatory Visit (INDEPENDENT_AMBULATORY_CARE_PROVIDER_SITE_OTHER): Payer: Medicare Other | Admitting: Podiatry

## 2020-01-02 ENCOUNTER — Other Ambulatory Visit: Payer: Self-pay

## 2020-01-02 ENCOUNTER — Encounter: Payer: Self-pay | Admitting: Podiatry

## 2020-01-02 DIAGNOSIS — B351 Tinea unguium: Secondary | ICD-10-CM

## 2020-01-02 DIAGNOSIS — E1140 Type 2 diabetes mellitus with diabetic neuropathy, unspecified: Secondary | ICD-10-CM | POA: Diagnosis not present

## 2020-01-02 DIAGNOSIS — Q828 Other specified congenital malformations of skin: Secondary | ICD-10-CM

## 2020-01-02 DIAGNOSIS — E1149 Type 2 diabetes mellitus with other diabetic neurological complication: Secondary | ICD-10-CM | POA: Diagnosis not present

## 2020-01-02 DIAGNOSIS — M79676 Pain in unspecified toe(s): Secondary | ICD-10-CM

## 2020-01-02 DIAGNOSIS — J449 Chronic obstructive pulmonary disease, unspecified: Secondary | ICD-10-CM | POA: Diagnosis not present

## 2020-01-02 NOTE — Progress Notes (Signed)
Patient ID: Matthew Molina, male   DOB: 1951/04/24, 69 y.o.   MRN: [id number] Complaint:  Visit Type: Patient returns to my office for  preventative foot care services. Complaint: Patient states" my nails have grown long and thick and become painful to walk and wear shoes" Patient has been diagnosed with DM with no foot complications. The patient presents for preventative foot care services. No changes to ROS He relates two painful callus under both feet.  Patient has not been seen for over 12 months.  Podiatric Exam: Vascular: dorsalis pedis and posterior tibial pulses are palpable bilateral. Capillary return is immediate. Temperature gradient is WNL. Skin turgor WNL  Sensorium: Normal Semmes Weinstein monofilament test. Normal tactile sensation bilaterally. Nail Exam: Pt has thick disfigured discolored nails with subungual debris noted bilateral entire nail hallux through fifth toenails Ulcer Exam: There is no evidence of ulcer or pre-ulcerative changes or infection. Orthopedic Exam: Muscle tone and strength are WNL. No limitations in general ROM. No crepitus or effusions noted. Foot type and digits show no abnormalities. Bony prominences are unremarkable.Mild brownish discoloration 1st MPJ right foot. Skin:  Porokeratosis sub 5th metabase both feet.. No infection or ulcers  Diagnosis:  Onychomycosis, , Pain in right toe, pain in left toes, Porokeratosis  Treatment & Plan Procedures and Treatment: Consent by patient was obtained for treatment procedures. The patient understood the discussion of treatment and procedures well. All questions were answered thoroughly reviewed. Debridement of mycotic and hypertrophic toenails, 1 through 5 bilateral and clearing of subungual debris. No ulceration, no infection noted. Debride porokeratosis.  Return Visit-Office Procedure: Patient instructed to return to the office for a follow up visit 3 months   for continued evaluation and treatment.    Gardiner Barefoot  DPM

## 2020-01-04 DIAGNOSIS — R05 Cough: Secondary | ICD-10-CM | POA: Diagnosis not present

## 2020-01-05 ENCOUNTER — Emergency Department (HOSPITAL_COMMUNITY): Payer: Medicare Other

## 2020-01-05 ENCOUNTER — Other Ambulatory Visit: Payer: Self-pay

## 2020-01-05 ENCOUNTER — Encounter (HOSPITAL_COMMUNITY): Payer: Self-pay

## 2020-01-05 ENCOUNTER — Inpatient Hospital Stay (HOSPITAL_COMMUNITY)
Admission: EM | Admit: 2020-01-05 | Discharge: 2020-01-08 | DRG: 378 | Disposition: A | Discharge status: Home / Self Care | Payer: Medicare Other | Attending: Internal Medicine | Admitting: Internal Medicine

## 2020-01-05 DIAGNOSIS — E785 Hyperlipidemia, unspecified: Secondary | ICD-10-CM | POA: Diagnosis not present

## 2020-01-05 DIAGNOSIS — K5732 Diverticulitis of large intestine without perforation or abscess without bleeding: Secondary | ICD-10-CM

## 2020-01-05 DIAGNOSIS — R0689 Other abnormalities of breathing: Secondary | ICD-10-CM | POA: Diagnosis not present

## 2020-01-05 DIAGNOSIS — F17210 Nicotine dependence, cigarettes, uncomplicated: Secondary | ICD-10-CM | POA: Diagnosis not present

## 2020-01-05 DIAGNOSIS — Z720 Tobacco use: Secondary | ICD-10-CM | POA: Diagnosis not present

## 2020-01-05 DIAGNOSIS — Z7984 Long term (current) use of oral hypoglycemic drugs: Secondary | ICD-10-CM | POA: Diagnosis not present

## 2020-01-05 DIAGNOSIS — J9611 Chronic respiratory failure with hypoxia: Secondary | ICD-10-CM | POA: Diagnosis not present

## 2020-01-05 DIAGNOSIS — Z8249 Family history of ischemic heart disease and other diseases of the circulatory system: Secondary | ICD-10-CM | POA: Diagnosis not present

## 2020-01-05 DIAGNOSIS — K5733 Diverticulitis of large intestine without perforation or abscess with bleeding: Secondary | ICD-10-CM | POA: Diagnosis not present

## 2020-01-05 DIAGNOSIS — F419 Anxiety disorder, unspecified: Secondary | ICD-10-CM | POA: Diagnosis present

## 2020-01-05 DIAGNOSIS — Z841 Family history of disorders of kidney and ureter: Secondary | ICD-10-CM | POA: Diagnosis not present

## 2020-01-05 DIAGNOSIS — Z7982 Long term (current) use of aspirin: Secondary | ICD-10-CM | POA: Diagnosis not present

## 2020-01-05 DIAGNOSIS — N1832 Chronic kidney disease, stage 3b: Secondary | ICD-10-CM | POA: Diagnosis present

## 2020-01-05 DIAGNOSIS — E1122 Type 2 diabetes mellitus with diabetic chronic kidney disease: Secondary | ICD-10-CM | POA: Diagnosis not present

## 2020-01-05 DIAGNOSIS — I129 Hypertensive chronic kidney disease with stage 1 through stage 4 chronic kidney disease, or unspecified chronic kidney disease: Secondary | ICD-10-CM | POA: Diagnosis present

## 2020-01-05 DIAGNOSIS — Z833 Family history of diabetes mellitus: Secondary | ICD-10-CM | POA: Diagnosis not present

## 2020-01-05 DIAGNOSIS — N1831 Chronic kidney disease, stage 3a: Secondary | ICD-10-CM | POA: Diagnosis not present

## 2020-01-05 DIAGNOSIS — N183 Chronic kidney disease, stage 3 unspecified: Secondary | ICD-10-CM | POA: Diagnosis present

## 2020-01-05 DIAGNOSIS — K559 Vascular disorder of intestine, unspecified: Secondary | ICD-10-CM

## 2020-01-05 DIAGNOSIS — K922 Gastrointestinal hemorrhage, unspecified: Secondary | ICD-10-CM | POA: Diagnosis not present

## 2020-01-05 DIAGNOSIS — F329 Major depressive disorder, single episode, unspecified: Secondary | ICD-10-CM | POA: Diagnosis present

## 2020-01-05 DIAGNOSIS — Z823 Family history of stroke: Secondary | ICD-10-CM | POA: Diagnosis not present

## 2020-01-05 DIAGNOSIS — R52 Pain, unspecified: Secondary | ICD-10-CM | POA: Diagnosis not present

## 2020-01-05 DIAGNOSIS — D62 Acute posthemorrhagic anemia: Secondary | ICD-10-CM | POA: Diagnosis not present

## 2020-01-05 DIAGNOSIS — K8020 Calculus of gallbladder without cholecystitis without obstruction: Secondary | ICD-10-CM | POA: Diagnosis not present

## 2020-01-05 DIAGNOSIS — K625 Hemorrhage of anus and rectum: Secondary | ICD-10-CM

## 2020-01-05 DIAGNOSIS — J449 Chronic obstructive pulmonary disease, unspecified: Secondary | ICD-10-CM | POA: Diagnosis present

## 2020-01-05 DIAGNOSIS — F17200 Nicotine dependence, unspecified, uncomplicated: Secondary | ICD-10-CM | POA: Diagnosis not present

## 2020-01-05 DIAGNOSIS — R410 Disorientation, unspecified: Secondary | ICD-10-CM | POA: Diagnosis not present

## 2020-01-05 DIAGNOSIS — Z79899 Other long term (current) drug therapy: Secondary | ICD-10-CM | POA: Diagnosis not present

## 2020-01-05 DIAGNOSIS — R58 Hemorrhage, not elsewhere classified: Secondary | ICD-10-CM | POA: Diagnosis not present

## 2020-01-05 DIAGNOSIS — R000 Tachycardia, unspecified: Secondary | ICD-10-CM | POA: Diagnosis not present

## 2020-01-05 DIAGNOSIS — Z7951 Long term (current) use of inhaled steroids: Secondary | ICD-10-CM

## 2020-01-05 DIAGNOSIS — K5730 Diverticulosis of large intestine without perforation or abscess without bleeding: Secondary | ICD-10-CM

## 2020-01-05 DIAGNOSIS — Z20822 Contact with and (suspected) exposure to covid-19: Secondary | ICD-10-CM | POA: Diagnosis present

## 2020-01-05 DIAGNOSIS — R0902 Hypoxemia: Secondary | ICD-10-CM | POA: Diagnosis not present

## 2020-01-05 HISTORY — DX: Acute posthemorrhagic anemia: D62

## 2020-01-05 HISTORY — DX: Vascular disorder of intestine, unspecified: K55.9

## 2020-01-05 LAB — COMPREHENSIVE METABOLIC PANEL
ALT: 47 U/L — ABNORMAL HIGH (ref 0–44)
AST: 31 U/L (ref 15–41)
Albumin: 3.6 g/dL (ref 3.5–5.0)
Alkaline Phosphatase: 42 U/L (ref 38–126)
Anion gap: 11 (ref 5–15)
BUN: 16 mg/dL (ref 8–23)
CO2: 20 mmol/L — ABNORMAL LOW (ref 22–32)
Calcium: 9.2 mg/dL (ref 8.9–10.3)
Chloride: 99 mmol/L (ref 98–111)
Creatinine, Ser: 1.59 mg/dL — ABNORMAL HIGH (ref 0.61–1.24)
GFR calc Af Amer: 50 mL/min — ABNORMAL LOW (ref >60)
GFR calc non Af Amer: 43 mL/min — ABNORMAL LOW (ref >60)
Glucose, Bld: 204 mg/dL — ABNORMAL HIGH (ref 70–99)
Potassium: 4.1 mmol/L (ref 3.5–5.1)
Sodium: 130 mmol/L — ABNORMAL LOW (ref 135–145)
Total Bilirubin: 1.1 mg/dL (ref 0.3–1.2)
Total Protein: 6.7 g/dL (ref 6.5–8.1)

## 2020-01-05 LAB — I-STAT CHEM 8, ED
BUN: 15 mg/dL (ref 8–23)
Calcium, Ion: 1.21 mmol/L (ref 1.15–1.40)
Chloride: 99 mmol/L (ref 98–111)
Creatinine, Ser: 1.7 mg/dL — ABNORMAL HIGH (ref 0.61–1.24)
Glucose, Bld: 201 mg/dL — ABNORMAL HIGH (ref 70–99)
HCT: 38 % — ABNORMAL LOW (ref 39.0–52.0)
Hemoglobin: 12.9 g/dL — ABNORMAL LOW (ref 13.0–17.0)
Potassium: 4 mmol/L (ref 3.5–5.1)
Sodium: 130 mmol/L — ABNORMAL LOW (ref 135–145)
TCO2: 20 mmol/L — ABNORMAL LOW (ref 22–32)

## 2020-01-05 LAB — CBC WITH DIFFERENTIAL/PLATELET
Abs Immature Granulocytes: 0.04 10*3/uL (ref 0.00–0.07)
Basophils Absolute: 0 10*3/uL (ref 0.0–0.1)
Basophils Relative: 0 %
Eosinophils Absolute: 0.1 10*3/uL (ref 0.0–0.5)
Eosinophils Relative: 1 %
HCT: 36.7 % — ABNORMAL LOW (ref 39.0–52.0)
Hemoglobin: 12.2 g/dL — ABNORMAL LOW (ref 13.0–17.0)
Immature Granulocytes: 0 %
Lymphocytes Relative: 7 %
Lymphs Abs: 0.9 10*3/uL (ref 0.7–4.0)
MCH: 30 pg (ref 26.0–34.0)
MCHC: 33.2 g/dL (ref 30.0–36.0)
MCV: 90.2 fL (ref 80.0–100.0)
Monocytes Absolute: 1.1 10*3/uL — ABNORMAL HIGH (ref 0.1–1.0)
Monocytes Relative: 9 %
Neutro Abs: 10.4 10*3/uL — ABNORMAL HIGH (ref 1.7–7.7)
Neutrophils Relative %: 83 %
Platelets: 248 10*3/uL (ref 150–400)
RBC: 4.07 MIL/uL — ABNORMAL LOW (ref 4.22–5.81)
RDW: 13.5 % (ref 11.5–15.5)
WBC: 12.5 10*3/uL — ABNORMAL HIGH (ref 4.0–10.5)
nRBC: 0 % (ref 0.0–0.2)

## 2020-01-05 LAB — GLUCOSE, CAPILLARY
Glucose-Capillary: 153 mg/dL — ABNORMAL HIGH (ref 70–99)
Glucose-Capillary: 117 mg/dL — ABNORMAL HIGH (ref 70–99)
Glucose-Capillary: 131 mg/dL — ABNORMAL HIGH (ref 70–99)

## 2020-01-05 LAB — MRSA PCR SCREENING: MRSA by PCR: NEGATIVE

## 2020-01-05 LAB — HEMOGLOBIN AND HEMATOCRIT, BLOOD
HCT: 32.9 % — ABNORMAL LOW (ref 39.0–52.0)
HCT: 29.9 % — ABNORMAL LOW (ref 39.0–52.0)
Hemoglobin: 10.6 g/dL — ABNORMAL LOW (ref 13.0–17.0)
Hemoglobin: 9.6 g/dL — ABNORMAL LOW (ref 13.0–17.0)

## 2020-01-05 LAB — TYPE AND SCREEN
ABO/RH(D): O POS
Antibody Screen: NEGATIVE

## 2020-01-05 LAB — HIV ANTIBODY (ROUTINE TESTING W REFLEX): HIV Screen 4th Generation wRfx: NONREACTIVE

## 2020-01-05 LAB — RESPIRATORY PANEL BY RT PCR (FLU A&B, COVID)
Influenza A by PCR: NEGATIVE
Influenza B by PCR: NEGATIVE
SARS Coronavirus 2 by RT PCR: NEGATIVE

## 2020-01-05 LAB — HEMOGLOBIN A1C
Hgb A1c MFr Bld: 6.8 % — ABNORMAL HIGH (ref 4.8–5.6)
Mean Plasma Glucose: 148.46 mg/dL

## 2020-01-05 LAB — PROTIME-INR
INR: 1.1 (ref 0.8–1.2)
Prothrombin Time: 13.9 seconds (ref 11.4–15.2)

## 2020-01-05 LAB — LACTIC ACID, PLASMA
Lactic Acid, Venous: 1.8 mmol/L (ref 0.5–1.9)
Lactic Acid, Venous: 2.4 mmol/L (ref 0.5–1.9)

## 2020-01-05 LAB — POC OCCULT BLOOD, ED: Fecal Occult Bld: POSITIVE — AB

## 2020-01-05 MED ORDER — IPRATROPIUM-ALBUTEROL 0.5-2.5 (3) MG/3ML IN SOLN
3.0000 mL | Freq: Three times a day (TID) | RESPIRATORY_TRACT | Status: DC
  Administered 2020-01-06 – 2020-01-08 (×7): 3 mL via RESPIRATORY_TRACT
  Filled 2020-01-05 (×7): qty 3

## 2020-01-05 MED ORDER — PIPERACILLIN-TAZOBACTAM 3.375 G IVPB: 3.3750 g | Freq: Three times a day (TID) | INTRAVENOUS | Status: DC

## 2020-01-05 MED ORDER — ONDANSETRON HCL 4 MG PO TABS: 4.0000 mg | ORAL_TABLET | Freq: Four times a day (QID) | ORAL | Status: DC | PRN

## 2020-01-05 MED ORDER — IPRATROPIUM-ALBUTEROL 0.5-2.5 (3) MG/3ML IN SOLN
3.0000 mL | Freq: Three times a day (TID) | RESPIRATORY_TRACT | Status: DC
  Administered 2020-01-05 (×2): 3 mL via RESPIRATORY_TRACT
  Filled 2020-01-05 (×2): qty 3

## 2020-01-05 MED ORDER — FLUTICASONE FUROATE-VILANTEROL 100-25 MCG/INH IN AEPB
1.0000 | INHALATION_SPRAY | Freq: Every day | RESPIRATORY_TRACT | Status: DC
  Administered 2020-01-05 – 2020-01-06 (×2): 1 via RESPIRATORY_TRACT

## 2020-01-05 MED ORDER — ONDANSETRON HCL 4 MG/2ML IJ SOLN: 4.0000 mg | Freq: Four times a day (QID) | INTRAMUSCULAR | Status: DC | PRN

## 2020-01-05 MED ORDER — BUSPIRONE HCL 5 MG PO TABS
7.5000 mg | ORAL_TABLET | Freq: Three times a day (TID) | ORAL | Status: DC
  Administered 2020-01-05 – 2020-01-08 (×10): 7.5 mg via ORAL
  Filled 2020-01-05 (×10): qty 2

## 2020-01-05 MED ORDER — ACETAMINOPHEN 650 MG RE SUPP: 650.0000 mg | Freq: Four times a day (QID) | RECTAL | Status: DC | PRN

## 2020-01-05 MED ORDER — RISPERIDONE 1 MG PO TABS
1.0000 mg | ORAL_TABLET | Freq: Every day | ORAL | Status: DC
  Administered 2020-01-05 – 2020-01-07 (×3): 1 mg via ORAL
  Filled 2020-01-05 (×3): qty 1

## 2020-01-05 MED ORDER — IOHEXOL 300 MG/ML  SOLN
80.0000 mL | Freq: Once | INTRAMUSCULAR | Status: AC | PRN
  Administered 2020-01-05: 80 mL via INTRAVENOUS

## 2020-01-05 MED ORDER — SODIUM CHLORIDE 0.9 % IV SOLN: INTRAVENOUS | Status: AC

## 2020-01-05 MED ORDER — RISPERIDONE 1 MG PO TABS
4.0000 mg | ORAL_TABLET | Freq: Every day | ORAL | Status: DC
  Administered 2020-01-05 – 2020-01-07 (×3): 4 mg via ORAL
  Filled 2020-01-05 (×3): qty 4

## 2020-01-05 MED ORDER — SODIUM CHLORIDE 0.9 % IV BOLUS
500.0000 mL | Freq: Once | INTRAVENOUS | Status: AC
  Administered 2020-01-05: 500 mL via INTRAVENOUS

## 2020-01-05 MED ORDER — ACETAMINOPHEN 325 MG PO TABS
650.0000 mg | ORAL_TABLET | Freq: Four times a day (QID) | ORAL | Status: DC | PRN
  Administered 2020-01-05 – 2020-01-07 (×3): 650 mg via ORAL
  Filled 2020-01-05 (×3): qty 2

## 2020-01-05 MED ORDER — LACTATED RINGERS IV BOLUS
1000.0000 mL | Freq: Once | INTRAVENOUS | Status: AC
  Administered 2020-01-05: 1000 mL via INTRAVENOUS

## 2020-01-05 MED ORDER — PIPERACILLIN-TAZOBACTAM 3.375 G IVPB
3.3750 g | Freq: Three times a day (TID) | INTRAVENOUS | Status: DC
  Administered 2020-01-05 – 2020-01-08 (×9): 3.375 g via INTRAVENOUS
  Filled 2020-01-05 (×9): qty 50

## 2020-01-05 MED ORDER — PANTOPRAZOLE SODIUM 40 MG IV SOLR
40.0000 mg | Freq: Once | INTRAVENOUS | Status: AC
  Administered 2020-01-05: 40 mg via INTRAVENOUS
  Filled 2020-01-05: qty 40

## 2020-01-05 MED ORDER — INSULIN ASPART 100 UNIT/ML ~~LOC~~ SOLN
0.0000 [IU] | Freq: Three times a day (TID) | SUBCUTANEOUS | Status: DC
  Administered 2020-01-05: 2 [IU] via SUBCUTANEOUS
  Administered 2020-01-06: 1 [IU] via SUBCUTANEOUS
  Administered 2020-01-06 – 2020-01-07 (×2): 2 [IU] via SUBCUTANEOUS
  Administered 2020-01-07 – 2020-01-08 (×2): 1 [IU] via SUBCUTANEOUS
  Administered 2020-01-08: 5 [IU] via SUBCUTANEOUS

## 2020-01-05 MED ORDER — PRAVASTATIN SODIUM 10 MG PO TABS
20.0000 mg | ORAL_TABLET | Freq: Every day | ORAL | Status: DC
  Administered 2020-01-05 – 2020-01-07 (×3): 20 mg via ORAL
  Filled 2020-01-05 (×3): qty 2

## 2020-01-05 MED ORDER — SODIUM CHLORIDE 0.9 % IV BOLUS
1000.0000 mL | Freq: Once | INTRAVENOUS | Status: AC
  Administered 2020-01-05: 1000 mL via INTRAVENOUS

## 2020-01-05 MED ORDER — PIPERACILLIN-TAZOBACTAM 3.375 G IVPB 30 MIN
3.3750 g | Freq: Once | INTRAVENOUS | Status: AC
  Administered 2020-01-05: 3.375 g via INTRAVENOUS
  Filled 2020-01-05: qty 50

## 2020-01-05 NOTE — Plan of Care (Signed)

## 2020-01-05 NOTE — Progress Notes (Signed)
Pharmacy Antibiotic Note  Matthew Molina is a 69 y.o. male admitted on 01/05/2020 with intra-abdominal infection.  Pharmacy has been consulted for zosyn dosing.  Plan: Zosyn 3.375g IV q8h (4 hour infusion).  F/u cxs and clinical progress Monitor V/S, labs  Height: 6\' 1"  (185.4 cm) Weight: 240 lb (108.9 kg) IBW/kg (Calculated) : 79.9  Temp (24hrs), Avg:97.9 F (36.6 C), Min:97.9 F (36.6 C), Max:97.9 F (36.6 C)  Recent Labs  Lab 01/05/20 0801 01/05/20 0810  WBC  --  12.5*  CREATININE 1.70* 1.59*  LATICACIDVEN  --  1.8    Estimated Creatinine Clearance: 55.9 mL/min (A) (by C-G formula based on SCr of 1.59 mg/dL (H)).    Allergies  Allergen Reactions  . Sertraline Hcl     Stomach upset/pain  . Wellbutrin [Bupropion] Other (See Comments)    Makes stomach hurt    Antimicrobials this admission: Zosyn 1/18 >>   Dose adjustments this admission: Zosyn prn  Microbiology results: 1/18 SARS2 CV is negative  Thank you for allowing pharmacy to be a part of this patient's care.  Isac Sarna, BS [place], [place] Clinical Pharmacist Pager [phone] 01/05/2020 11:27 AM

## 2020-01-05 NOTE — ED Provider Notes (Signed)
[facility] EMERGENCY DEPARTMENT Provider Note   CSN: [id number] Arrival date & time: 01/05/20  7106     History Chief Complaint  Patient presents with  . Rectal Bleeding    Matthew Molina is a 69 y.o. male.  HPI 69 year old male presents with rectal bleeding.  Around 6 AM when having a bowel movement he had a large amount of blood with blood clots.  A lot of blood came out at one time.  He has also had abdominal pain since that happened.  The pain is better but still present.  No chest pain or shortness of breath.  He is on baby aspirin but no other blood thinners.  EMS had a blood pressure of 70/40. No vomiting/hematemesis.   Past Medical History:  Diagnosis Date  . Acute kidney injury ([place])   . Acute respiratory failure with hypoxia ([place])   . Arthritis   . COPD (chronic obstructive pulmonary disease) ([place])   . Depression   . Diabetes mellitus   . Diabetes mellitus without complication ([place])   . Hypercholesterolemia   . Hyperlipidemia   . Hypertension   . Multiple lung nodules on CT 04/02/2015  . Nicotine addiction   . Obesity   . Oxygen deficiency    qhs  . Schizophrenia [employer])     Patient Active Problem List   Diagnosis Date Noted  . Urinary incontinence 10/08/2019  . COPD with acute exacerbation ([place]) 04/17/2019  . Unsteady gait 04/18/2017  . Elevated PSA 10/01/2016  . GERD (gastroesophageal reflux disease) 08/24/2016  . CKD (chronic kidney disease) stage 3, GFR 30-59 ml/min (HCC) 05/22/2016  . Insomnia 01/05/2016  . COPD (chronic obstructive pulmonary disease) ([place]) 11/05/2015  . Hypertension 11/05/2015  . Tobacco abuse 07/24/2015  . Coronary atherosclerosis of native coronary artery 04/02/2015  . Multiple lung nodules on CT 04/02/2015  . GAD (generalized anxiety disorder) 07/04/2012  . Back pain with radiation 11/01/2011  . Diabetes mellitus type 2 in obese ([place]) 04/17/2011  . Hyperlipidemia 06/02/2008  . Obesity (BMI 30.0-34.9) 06/02/2008  .  Schizophrenia, unspecified type ([place]) 06/02/2008    Past Surgical History:  Procedure Laterality Date  . CATARACT EXTRACTION W/PHACO Left 11/20/2013   Procedure: CATARACT EXTRACTION PHACO AND INTRAOCULAR LENS PLACEMENT (IOC);  Surgeon: Tonny Branch, MD;  Location: [facility] ORS;  Service: Ophthalmology;  Laterality: Left;  CDE:10.26  . CATARACT EXTRACTION W/PHACO Right 12/08/2013   Procedure: RIGHT EYE CATARACT EXTRACTION PHACO AND INTRAOCULAR LENS PLACEMENT ;  Surgeon: Tonny Branch, MD;  Location: [facility] ORS;  Service: Ophthalmology;  Laterality: Right;  CDE 12.38  . COLONOSCOPY N/A 04/01/2013   Procedure: COLONOSCOPY;  Surgeon: Danie Binder, MD;  Location: [facility] ENDO SUITE;  Service: Endoscopy;  Laterality: N/A;  10:00 AM-moved to [place] notified pt  . COLONOSCOPY  2014   INCOMPLETE PREP IN R COLON  . ESOPHAGOGASTRODUODENOSCOPY N/A 02/18/2016   Procedure: ESOPHAGOGASTRODUODENOSCOPY (EGD);  Surgeon: Danie Binder, MD;  Location: [facility] ENDO SUITE;  Service: Endoscopy;  Laterality: N/A;  1145  . EYE SURGERY Left 11/2013   cataract extraction  . SAVORY DILATION N/A 02/18/2016   Procedure: SAVORY DILATION;  Surgeon: Danie Binder, MD;  Location: [facility] ENDO SUITE;  Service: Endoscopy;  Laterality: N/A;  . SHOULDER SURGERY         Family History  Problem Relation Age of Onset  . Diabetes Mother   . Hypertension Mother   . Stroke Mother   . Diabetes Sister   . Heart disease  Sister   . Kidney disease Father   . Drug abuse Brother   . Colon cancer Neg Hx   . Colon polyps Neg Hx     Social History   Tobacco Use  . Smoking status: Current Every Day Smoker    Packs/day: 1.00    Years: 48.00    Pack years: 48.00    Types: Cigarettes    Last attempt to quit: 05/02/2017    Years since quitting: 2.6  . Smokeless tobacco: Never Used  Substance Use Topics  . Alcohol use: Not Currently    Alcohol/week: 0.0 standard drinks  . Drug use: No    Home Medications Prior to Admission medications    Medication Sig Start Date End Date Taking? Authorizing Provider  Accu-Chek Softclix Lancets lancets TEST BLOOD SUGAR ONCE DAILY AS DIRECTED. 07/07/19   Fayrene Helper, MD  acetaminophen (TYLENOL) 500 MG tablet Take 500-1,000 mg by mouth daily as needed. Takes mostly for HA about 2 x/week.    [provider]  albuterol (VENTOLIN HFA) 108 (90 Base) MCG/ACT inhaler INHALE 2 PUFFS INTO THE LUNGS EVERY 6 HOURS AS NEEDED FOR WHEEZING OR SHORTNESS OF BREATH. 12/23/19   Fayrene Helper, MD  aspirin EC 81 MG tablet Take 81 mg by mouth daily.    [provider]  Blood Glucose Monitoring Suppl (ACCU-CHEK AVIVA PLUS) w/Device KIT accucheck aviva meter Once daily testing DX e11.9 07/09/19   Fayrene Helper, MD  budesonide-formoterol (SYMBICORT) 80-4.5 MCG/ACT inhaler INHALE 2 PUFFS INTO LUNGS TWICE DAILY. 12/23/19   Fayrene Helper, MD  busPIRone (BUSPAR) 7.5 MG tablet TAKE 1 TABLET BY MOUTH 3 TIMES DAILY. 10/09/19   Fayrene Helper, MD  GLIPIZIDE XL 5 MG 24 hr tablet TAKE 1 TABLET DAILY WITH BREAKFAST. 10/28/19   Fayrene Helper, MD  glucose blood (ACCU-CHEK AVIVA PLUS) test strip USE TO TEST ONCE DAILY 07/07/19   Fayrene Helper, MD  ipratropium-albuterol (DUONEB) 0.5-2.5 (3) MG/3ML SOLN USE 1 VIAL BY NEBULIZATION EVERY 4 TO 6 HOURS 10/24/19   Fayrene Helper, MD  olmesartan (BENICAR) 40 MG tablet TAKE 1 TABLET BY MOUTH ONCE A DAY. 10/09/19   Fayrene Helper, MD  pravastatin (PRAVACHOL) 20 MG tablet TAKE (1) TABLET BY MOUTH AT BEDTIME FOR CHOLESTEROL. 11/06/19   Fayrene Helper, MD  risperiDONE (RISPERDAL) 1 MG tablet TAKE (1) TABLET BY MOUTH AT BEDTIME WITH 4MG. 10/09/19   Fayrene Helper, MD  risperidone (RISPERDAL) 4 MG tablet Takes with a 1 mg tablet at night to = 5 mg dose 10/08/19   Fayrene Helper, MD  tiZANidine (ZANAFLEX) 2 MG tablet TAKE 1 TABLET ONCE DAILY AS NEEDED FOR MUSCLE SPASMS. 10/28/19   Fayrene Helper, MD  traZODone (DESYREL)  50 MG tablet Take 1 tablet (50 mg total) by mouth at bedtime as needed. for sleep 08/14/19   Fayrene Helper, MD  triamterene-hydrochlorothiazide (MAXZIDE-25) 37.5-25 MG tablet Take 0.5 tablets by mouth daily. 10/08/19   Fayrene Helper, MD  UNABLE TO FIND Nebulizer mouthpiece and tubing x 1  DX COPD 05/29/19   Fayrene Helper, MD  UNABLE TO FIND XL pullups for daily use DX Incontinence 10/08/19   Fayrene Helper, MD  UNABLE TO FIND Nebulizer tubing x 1  DX J44.9 10/14/19   Fayrene Helper, MD  sildenafil (VIAGRA) 100 MG tablet Take 100 mg by mouth. Take one tablet 30 mins before intercourse   03/11/12  [provider]  Allergies    Sertraline hcl and Wellbutrin [bupropion]  Review of Systems   Review of Systems  Respiratory: Negative for shortness of breath.   Cardiovascular: Negative for chest pain.  Gastrointestinal: Positive for abdominal pain and blood in stool. Negative for vomiting.  All other systems reviewed and are negative.   Physical Exam Updated Vital Signs BP 91/60 (BP Location: Right Arm)   Pulse (!) 121   Resp (!) 25   Ht _0  (1.854 m)   Wt 108.9 kg   BMI 31.66 kg/m   Physical Exam Vitals and nursing note reviewed.  Constitutional:      Appearance: He is well-developed. He is obese.  HENT:     Head: Normocephalic and atraumatic.     Right Ear: External ear normal.     Left Ear: External ear normal.     Nose: Nose normal.  Eyes:     General:        Right eye: No discharge.        Left eye: No discharge.  Cardiovascular:     Rate and Rhythm: Regular rhythm. Tachycardia present.     Heart sounds: Normal heart sounds.  Pulmonary:     Effort: Pulmonary effort is normal.     Breath sounds: Normal breath sounds.  Abdominal:     Palpations: Abdomen is soft.     Tenderness: There is abdominal tenderness (generalized, mild, non focal).  Genitourinary:    Comments: Patient is in a pull up that is soaked with blood. Some blood  on DRE, no masses, hemorrhoids.  Musculoskeletal:     Cervical back: Neck supple.  Skin:    General: Skin is warm and dry.  Neurological:     Mental Status: He is alert.  Psychiatric:        Mood and Affect: Mood is not anxious.     ED Results / Procedures / Treatments   Labs (all labs ordered are listed, but only abnormal results are displayed) Labs Reviewed  COMPREHENSIVE METABOLIC PANEL  LACTIC ACID, PLASMA  LACTIC ACID, PLASMA  CBC WITH DIFFERENTIAL/PLATELET  PROTIME-INR  I-STAT CHEM 8, ED  TYPE AND SCREEN    EKG None  Radiology No results found.  Procedures Procedures (including critical care time)  Medications Ordered in ED Medications  sodium chloride 0.9 % bolus 500 mL (has no administration in time range)    ED Course  I have reviewed the triage vital signs and the nursing notes.  Pertinent labs & imaging results that were available during my care of the patient were reviewed by me and considered in my medical decision making (see chart for details).    MDM Rules/Calculators/A&P                      Colonoscopy in 2014 showed diverticulosis as well as large internal hemorrhoids. Patient is ill-appearing though this improved with some fluids.  Given his abdominal tenderness, CT obtained.  Shows what appears to be some mild diverticulitis.  I think overall this is primarily a diverticular/rectal bleed rather than acute infection.  However will give Zosyn.  His initial hemoglobin is okay at 12 but it is very early in the course and will need to equilibrate.  Discussed with gastroenterology who will consult.  Dr. Carles Collet to admit. Final Clinical Impression(s) / ED Diagnoses Final diagnoses:  Rectal bleeding    Rx / DC Orders ED Discharge Orders    None  Sherwood Gambler, MD 01/05/20 1745

## 2020-01-05 NOTE — Consult Note (Signed)
Referring Provider: Dr. Carles Collet  Primary Care Physician:  Fayrene Helper, MD Primary Gastroenterologist:  Dr. Oneida Alar   Date of Admission: 01/05/2020 Date of Consultation: 01/05/2020  Reason for Consultation:  Lower GI bleed   HPI:  Matthew Molina is a 69 y.o. year old male presenting today with acute onset of lower GI bleeding this morning around 0600. He is a difficult historian, noting he is unsure if he had associated abdominal pain; however, notes reviewed and patient had noted abdominal pain associated with bleeding at time of presentation. EMS reported hypotensive with BP 70/40 prior to admission. Systolic BP in the 78E/42P at time of presentation and now most recent BP 123/85 following multiple NS boluses. Hgb 12.9 on admission and most recent 12.2. CT abd/pelvis with contrast noted focal area of asymmetric inflammation involving mid ascending colon likely reflecting acute diverticulitis.   Patient notes bright red blood around 6am, with urge to have BM waking him up. Difficult historian and unable to quantify amount or frequency. No further bleeding since ED presentation. He is unsure if he had abdominal pain or not at time of bleeding. He denies abdominal pain currently. No N/V. Feels weak currently. States he can't think straight because all that is going on. He does report taking at least 1 BC powder daily if not more for arthritic pain. 81 mg aspirin daily. No anticoagulation. No PPI.   Last colonoscopy with poor prep in 2014: pancolonic diverticulosis, redundant colon, large internal hemorrhoids.   EGD March 2017: stricture at GE junction, moderate erosive gastritis and mild non-erosive duodenitis. +H.pylori. Treated initially with Prevpac. Breath test to check for eradication was positive, and he was prescribed Pylera.      Past Medical History:  Diagnosis Date  . Acute kidney injury ([place])   . Acute respiratory failure with hypoxia ([place])   . Arthritis   . COPD (chronic  obstructive pulmonary disease) ([place])   . Depression   . Diabetes mellitus   . Diabetes mellitus without complication ([place])   . Hypercholesterolemia   . Hyperlipidemia   . Hypertension   . Multiple lung nodules on CT 04/02/2015  . Nicotine addiction   . Obesity   . Oxygen deficiency    qhs  . Schizophrenia [facility])     Past Surgical History:  Procedure Laterality Date  . CATARACT EXTRACTION W/PHACO Left 11/20/2013   Procedure: CATARACT EXTRACTION PHACO AND INTRAOCULAR LENS PLACEMENT (IOC);  Surgeon: Tonny Branch, MD;  Location: [facility] ORS;  Service: Ophthalmology;  Laterality: Left;  CDE:10.26  . CATARACT EXTRACTION W/PHACO Right 12/08/2013   Procedure: RIGHT EYE CATARACT EXTRACTION PHACO AND INTRAOCULAR LENS PLACEMENT ;  Surgeon: Tonny Branch, MD;  Location: [facility] ORS;  Service: Ophthalmology;  Laterality: Right;  CDE 12.38  . COLONOSCOPY N/A 04/01/2013   pancolonic diverticulosis, redundant colon, large internal hemorrhoids.   . COLONOSCOPY  2014   INCOMPLETE PREP IN R COLON  . ESOPHAGOGASTRODUODENOSCOPY N/A 02/18/2016   stricture at GE junction, moderate erosive gastritis and mild non-erosive duodenitis. +H.pylori. Treated initially with Prevpac. Breath test to check for eradication was positive, and he was prescribed Pylera.   Marland Kitchen EYE SURGERY Left 11/2013   cataract extraction  . SAVORY DILATION N/A 02/18/2016   Procedure: SAVORY DILATION;  Surgeon: Danie Binder, MD;  Location: [facility] ENDO SUITE;  Service: Endoscopy;  Laterality: N/A;  . SHOULDER SURGERY      Prior to Admission medications   Medication Sig Start Date End Date Taking? Authorizing Provider  Accu-Chek Softclix Lancets lancets TEST BLOOD SUGAR ONCE DAILY AS DIRECTED. 07/07/19   Fayrene Helper, MD  acetaminophen (TYLENOL) 500 MG tablet Take 500-1,000 mg by mouth daily as needed. Takes mostly for HA about 2 x/week.    [provider]  albuterol (VENTOLIN HFA) 108 (90 Base) MCG/ACT inhaler INHALE 2 PUFFS INTO THE LUNGS EVERY 6  HOURS AS NEEDED FOR WHEEZING OR SHORTNESS OF BREATH. 12/23/19   Fayrene Helper, MD  aspirin EC 81 MG tablet Take 81 mg by mouth daily.    [provider]  Blood Glucose Monitoring Suppl (ACCU-CHEK AVIVA PLUS) w/Device KIT accucheck aviva meter Once daily testing DX e11.9 07/09/19   Fayrene Helper, MD  budesonide-formoterol (SYMBICORT) 80-4.5 MCG/ACT inhaler INHALE 2 PUFFS INTO LUNGS TWICE DAILY. 12/23/19   Fayrene Helper, MD  busPIRone (BUSPAR) 7.5 MG tablet TAKE 1 TABLET BY MOUTH 3 TIMES DAILY. 10/09/19   Fayrene Helper, MD  GLIPIZIDE XL 5 MG 24 hr tablet TAKE 1 TABLET DAILY WITH BREAKFAST. 10/28/19   Fayrene Helper, MD  glucose blood (ACCU-CHEK AVIVA PLUS) test strip USE TO TEST ONCE DAILY 07/07/19   Fayrene Helper, MD  ipratropium-albuterol (DUONEB) 0.5-2.5 (3) MG/3ML SOLN USE 1 VIAL BY NEBULIZATION EVERY 4 TO 6 HOURS 10/24/19   Fayrene Helper, MD  olmesartan (BENICAR) 40 MG tablet TAKE 1 TABLET BY MOUTH ONCE A DAY. 10/09/19   Fayrene Helper, MD  pravastatin (PRAVACHOL) 20 MG tablet TAKE (1) TABLET BY MOUTH AT BEDTIME FOR CHOLESTEROL. 11/06/19   Fayrene Helper, MD  risperiDONE (RISPERDAL) 1 MG tablet TAKE (1) TABLET BY MOUTH AT BEDTIME WITH '4MG'$ . 10/09/19   Fayrene Helper, MD  risperidone (RISPERDAL) 4 MG tablet Takes with a 1 mg tablet at night to = 5 mg dose 10/08/19   Fayrene Helper, MD  tiZANidine (ZANAFLEX) 2 MG tablet TAKE 1 TABLET ONCE DAILY AS NEEDED FOR MUSCLE SPASMS. 10/28/19   Fayrene Helper, MD  traZODone (DESYREL) 50 MG tablet Take 1 tablet (50 mg total) by mouth at bedtime as needed. for sleep 08/14/19   Fayrene Helper, MD  triamterene-hydrochlorothiazide (MAXZIDE-25) 37.5-25 MG tablet Take 0.5 tablets by mouth daily. 10/08/19   Fayrene Helper, MD  UNABLE TO FIND Nebulizer mouthpiece and tubing x 1  DX COPD 05/29/19   Fayrene Helper, MD  UNABLE TO FIND XL pullups for daily use DX Incontinence 10/08/19    Fayrene Helper, MD  UNABLE TO FIND Nebulizer tubing x 1  DX J44.9 10/14/19   Fayrene Helper, MD  sildenafil (VIAGRA) 100 MG tablet Take 100 mg by mouth. Take one tablet 30 mins before intercourse   03/11/12  [provider]    Current Facility-Administered Medications  Medication Dose Route Frequency Provider Last Rate Last Admin  . piperacillin-tazobactam (ZOSYN) IVPB 3.375 g  3.375 g Intravenous Q8H Sherwood Gambler, MD        Allergies as of 01/05/2020 - Review Complete 01/05/2020  Allergen Reaction Noted  . Sertraline hcl  04/17/2011  . Wellbutrin [bupropion] Other (See Comments) 07/29/2015    Family History  Problem Relation Age of Onset  . Diabetes Mother   . Hypertension Mother   . Stroke Mother   . Diabetes Sister   . Heart disease Sister   . Kidney disease Father   . Drug abuse Brother   . Colon cancer Neg Hx   . Colon polyps Neg Hx  Social History   Socioeconomic History  . Marital status: Divorced    Spouse name: Not on file  . Number of children: 2  . Years of education: Not on file  . Highest education level: Not on file  Occupational History  . Occupation: retired from [employer]  . Smoking status: Current Every Day Smoker    Packs/day: 1.00    Years: 48.00    Pack years: 48.00    Types: Cigarettes    Last attempt to quit: 05/02/2017    Years since quitting: 2.6  . Smokeless tobacco: Never Used  Substance and Sexual Activity  . Alcohol use: Not Currently    Alcohol/week: 0.0 standard drinks  . Drug use: No  . Sexual activity: Not Currently    Birth control/protection: None  Other Topics Concern  . Not on file  Social History Narrative   ** Merged History Encounter **       ** Merged History Encounter **       Social Determinants of Health   Financial Resource Strain:   . Difficulty of Paying Living Expenses: Not on file  Food Insecurity:   . Worried About Charity fundraiser in the Last Year: Not on file   . Ran Out of Food in the Last Year: Not on file  Transportation Needs:   . Lack of Transportation (Medical): Not on file  . Lack of Transportation (Non-Medical): Not on file  Physical Activity:   . Days of Exercise per Week: Not on file  . Minutes of Exercise per Session: Not on file  Stress:   . Feeling of Stress : Not on file  Social Connections:   . Frequency of Communication with Friends and Family: Not on file  . Frequency of Social Gatherings with Friends and Family: Not on file  . Attends Religious Services: Not on file  . Active Member of Clubs or Organizations: Not on file  . Attends Archivist Meetings: Not on file  . Marital Status: Not on file  Intimate Partner Violence:   . Fear of Current or Ex-Partner: Not on file  . Emotionally Abused: Not on file  . Physically Abused: Not on file  . Sexually Abused: Not on file    Review of Systems: Gen: see HPI CV: Denies chest pain, heart palpitations, syncope, edema  Resp: +DOE GI: see HPI  GU : Denies urinary burning, urinary frequency, urinary incontinence.  MS: see HPI Derm: Denies rash, itching, dry skin Psych: Denies depression, anxiety,confusion, or memory loss Heme: see HPI  Physical Exam: Vital signs in last 24 hours: Temp:  [97.9 F (36.6 C)] 97.9 F (36.6 C) (01/18 0823) Pulse Rate:  [100-121] 116 (01/18 1120) Resp:  [18-29] 26 (01/18 1120) BP: (82-123)/(45-93) 123/85 (01/18 1120) SpO2:  [90 %-98 %] 98 % (01/18 1120) Weight:  [108.9 kg] 108.9 kg (01/18 0745)   General:   Alert, well-developed, appears fatigued, oriented X 4.  Head:  Normocephalic and atraumatic. Eyes:  Sclera clear, no icterus.   Ears:  Normal auditory acuity. Lungs:  Scattered rhonchi  Heart:  S1 S2 present, mildly tachycardic in low 1teens Abdomen:  Soft, nontender and nondistended. No masses, hepatosplenomegaly or hernias noted. Normal bowel sounds, without guarding, and without rebound.   Rectal:  Deferred  Msk:   Symmetrical without gross deformities. Normal posture. Extremities:  Without edema. Neurologic:  Alert and  oriented x4 Psych:  Alert and cooperative. Normal mood and affect.  Intake/Output  from previous day: No intake/output data recorded. Intake/Output this shift: Total I/O In: 2044.8 [IV Piggyback:2044.8] Out: -   Lab Results: Recent Labs    01/05/20 0801 01/05/20 0810  WBC  --  12.5*  HGB 12.9* 12.2*  HCT 38.0* 36.7*  PLT  --  248   BMET Recent Labs    01/05/20 0801 01/05/20 0810  NA 130* 130*  K 4.0 4.1  CL 99 99  CO2  --  20*  GLUCOSE 201* 204*  BUN 15 16  CREATININE 1.70* 1.59*  CALCIUM  --  9.2   LFT Recent Labs    01/05/20 0810  PROT 6.7  ALBUMIN 3.6  AST 31  ALT 47*  ALKPHOS 42  BILITOT 1.1   PT/INR Recent Labs    01/05/20 0810  LABPROT 13.9  INR 1.1    Studies/Results: CT ABDOMEN PELVIS W CONTRAST  Result Date: 01/05/2020 CLINICAL DATA:  Melena. Copious amount of red blood and clots coming from rectum. Abdomen hurting. EXAM: CT ABDOMEN AND PELVIS WITH CONTRAST TECHNIQUE: Multidetector CT imaging of the abdomen and pelvis was performed using the standard protocol following bolus administration of intravenous contrast. CONTRAST:  21m OMNIPAQUE IOHEXOL 300 MG/ML  SOLN COMPARISON:  11/07/2019 FINDINGS: Lower chest: Small pleural effusions identified. Calcifications within the left circumflex and RCA coronary arteries. Aortic atherosclerosis. Hepatobiliary: No focal liver abnormality is seen. Tiny stone within the dependent portion of gallbladder measuring 4 mm. No gallbladder inflammation. No biliary ductal dilatation. Pancreas: Unremarkable. No pancreatic ductal dilatation or surrounding inflammatory changes. Spleen: Normal in size without focal abnormality. Adrenals/Urinary Tract: The right there is asymmetric left adrenal hypertrophy. Small right adrenal gland nodule measures 1.1 cm. This is unchanged from 2018. Likely representing a benign  abnormality. Multiple right kidney cysts are identified. The largest arises from the upper pole measuring 5.3 cm. These wall are somewhat obscured by motion artifact. Several small left kidney cysts are also noted. No hydronephrosis identified bilaterally. Urinary bladder negative. Stomach/Bowel: Stomach in the small bowel loops are unremarkable. Diffuse colonic diverticulosis identified. There is a focal area of asymmetric inflammation involving the mid ascending colon likely reflecting acute diverticulitis. No significant free fluid or fluid collection. No abnormal bowel distension. Vascular/Lymphatic: Aortic atherosclerosis. There is no aneurysm. No abdominopelvic adenopathy identified. Reproductive: Mild prostate gland enlargement. Other: No free fluid or fluid collections. Periumbilical hernia contains fat only. There is also a small fat containing supraumbilical hernia. Musculoskeletal: L5-S1 degenerative disc disease. IMPRESSION: 1. Examination is positive for acute diverticulitis involving the mid ascending colon. No perforation or abscess identified. 2. Small bilateral pleural effusions. 3. Aortic atherosclerosis and multi vessel coronary artery calcification. 4. Gallstone. Aortic Atherosclerosis (ICD10-I70.0). Electronically Signed   By: TKerby MoorsM.D.   On: 01/05/2020 09:18    Impression: 69year old male presenting with large volume rectal bleeding, hypotension now improved s/p multiple NS boluses, and CT findings concerning for acute diverticulitis involving mid ascending colon. Difficult historian, and he is able to give me just limited history regarding events leading to ED presentation, only stating he feels weak. No further rectal bleeding since ED presentation, and abdominal exam is unrevealing. Rectal bleeding not typically seen with diverticulitis; he does note taking BC powders routinely at least once per day due to arthritic pain. Last colonoscopy in 2014 with documented pan-colonic  diverticulosis. No indication for colonoscopy at this time, with measures largely supportive in this scenario. Appreciate pharmacy consultation to start IV antibiotics. Recommend avoidance of aspirin powders indefinitely, add PPI,  and elective outpatient colonoscopy once acute illness resolves as last was in 2014.   I did review the CT with Dr. Thornton Papas. Definite focal inflammation consistent with diverticulitis, with the rest of the colon normal. No significant mesenteric vascular disease.     Plan: IV antibiotics: appreciate Pharmacy following Add PPI Follow H/H Monitor for persistent bleeding Continue IV fluid resuscitation Supportive measures Avoid NSAIDs Colonoscopy electively as outpatient in 6-8 weeks  Annitta Needs, PhD, ANP-BC [facility] Gastroenterology     LOS: 0 days    01/05/2020, 11:53 AM

## 2020-01-05 NOTE — Progress Notes (Addendum)
CRITICAL VALUE ALERT  Critical Value:  Lactic acid 2.4  Date & Time Notied:  01/05/20 @ 1611  Provider Notified: Tat  Orders Received/Actions taken: Give 1000 mL LR bolus.

## 2020-01-05 NOTE — ED Triage Notes (Signed)
Pt woke up with copious amounts of red blood and clots coming from rectum. Woke up with abdomen hurting, now denies pain. Last BP 70/40 HR 132

## 2020-01-05 NOTE — H&P (Signed)
History and Physical  Matthew Molina:[id number] DOB: 12/29/1950 DOA: 01/05/2020   PCP: Matthew Helper, MD   Patient coming from: Home  Chief Complaint: rectal bleeding  HPI:  Matthew Molina is a 69 y.o. male with medical history of COPD, diabetes mellitus type 2, depression, hyperlipidemia, hypertension, tobacco abuse, CKD stage III presenting with rectal bleeding that began around 6 AM on the morning of 01/05/2020.  The patient stated that he had some right-sided abdominal pain associated with this.  He states that he passed a large volume amount of blood with clots.  The patient had been in his usual state of health prior to this episode.  He denied any fevers, chills, chest pain, shortness breath, coughing, nausea, vomiting, diarrhea.  Upon EMS arrival, the patient was noted to be hypotensive with a blood pressure of 70/40 and tachycardia with heart rate of 130s.  Patient states that he had an episode of small-volume hematochezia approximately 1 year prior to this admission but denies any medical care at that time.  He denies any hemoptysis, hematemesis, melena, dysuria, hematuria.  He denies any NSAIDs or alcohol use. In the emergency department, the patient was afebrile with soft blood pressures in the low 90s.  Oxygen saturation was 97-90% on room air.  Sodium was 130, potassium 4.1, serum creatinine 1.59.  LFTs showed AST 31, ALT 47, alk phosphatase 42, total bilirubin 1.1.  WBC 12.5, hemoglobin 12.2, platelets 240,000.  Lactic acid 1.8.  CT of the abdomen and pelvis showed diffuse diverticulosis with asymmetric area of focal inflammatory change in the ascending colon.  Assessment/Plan: Hematochezia/colitis/acute blood loss anemia -Concerned about ischemic colitis -Baseline hemoglobin~13 -Continue Zosyn -GI consult -Clear liquid diet -Monitor serial hemoglobin -Hold aspirin -IVF  Diabetes mellitus type 2 -Hemoglobin A1c -NovoLog sliding scale  Essential  hypertension -Holding olmesartan and Maxide secondary to soft blood pressures  CKD stage IIIa -Baseline creatinine 1.2-1.4 -A.m. BMP  COPD/tobacco abuse -Tobacco cessation discussed -Start duo nebs -Continue Symbicort I have discussed tobacco cessation with the patient.  I have counseled the patient regarding the negative impacts of continued tobacco use including but not limited to lung cancer, COPD, and cardiovascular disease.  I have discussed alternatives to tobacco and modalities that may help facilitate tobacco cessation including but not limited to biofeedback, hypnosis, and medications.  Total time spent with tobacco counseling was 4 minutes.   Hyperlipidemia -Continue statin      Past Medical History:  Diagnosis Date  . Acute kidney injury ([place])   . Acute respiratory failure with hypoxia ([place])   . Arthritis   . COPD (chronic obstructive pulmonary disease) ([place])   . Depression   . Diabetes mellitus   . Diabetes mellitus without complication ([place])   . Hypercholesterolemia   . Hyperlipidemia   . Hypertension   . Multiple lung nodules on CT 04/02/2015  . Nicotine addiction   . Obesity   . Oxygen deficiency    qhs  . Schizophrenia [facility])    Past Surgical History:  Procedure Laterality Date  . CATARACT EXTRACTION W/PHACO Left 11/20/2013   Procedure: CATARACT EXTRACTION PHACO AND INTRAOCULAR LENS PLACEMENT (IOC);  Surgeon: Tonny Branch, MD;  Location: [facility] ORS;  Service: Ophthalmology;  Laterality: Left;  CDE:10.26  . CATARACT EXTRACTION W/PHACO Right 12/08/2013   Procedure: RIGHT EYE CATARACT EXTRACTION PHACO AND INTRAOCULAR LENS PLACEMENT ;  Surgeon: Tonny Branch, MD;  Location: [facility];  Service: Ophthalmology;  Laterality: Right;  CDE 12.38  .  COLONOSCOPY N/A 04/01/2013   Procedure: COLONOSCOPY;  Surgeon: Danie Binder, MD;  Location: [facility] SU[facility];  Service: Endoscopy;  Laterality: N/A;  10:00 AM-moved to [place] notified pt  . COLONOSCOPY  2014   INCOMPLETE PREP  IN R COLON  . ESOPHAGOGASTRODUODENOSCOPY N/A 02/18/2016   Procedure: ESOPHAGOGASTRODUODENOSCOPY (EGD);  Surgeon: Danie Binder, MD;  Location: [facility];  Service: Endoscopy;  Laterality: N/A;  1145  . EYE SURGERY Left 11/2013   cataract extraction  . SAVORY DILATION N/A 02/18/2016   Procedure: SAVORY DILATION;  Surgeon: Danie Binder, MD;  Location: [facility];  Service: Endoscopy;  Laterality: N/A;  . SHOULDER SURGERY     Social History:  reports that he has been smoking cigarettes. He has a 48.00 pack-year smoking history. He has never used smokeless tobacco. He reports previous alcohol use. He reports that he does not use drugs.   Family History  Problem Relation Age of Onset  . Diabetes Mother   . Hypertension Mother   . Stroke Mother   . Diabetes Sister   . Heart disease Sister   . Kidney disease Father   . Drug abuse Brother   . Colon cancer Neg Hx   . Colon polyps Neg Hx      Allergies  Allergen Reactions  . Sertraline Hcl     Stomach upset/pain  . Wellbutrin [Bupropion] Other (See Comments)    Makes stomach hurt     Prior to Admission medications   Medication Sig Start Date End Date Taking? Authorizing Provider  Accu-Chek Softclix Lancets lancets TEST BLOOD SUGAR ONCE DAILY AS DIRECTED. 07/07/19   Matthew Helper, MD  acetaminophen (TYLENOL) 500 MG tablet Take 500-1,000 mg by mouth daily as needed. Takes mostly for HA about 2 x/week.    [provider]  albuterol (VENTOLIN HFA) 108 (90 Base) MCG/ACT inhaler INHALE 2 PUFFS INTO THE LUNGS EVERY 6 HOURS AS NEEDED FOR WHEEZING OR SHORTNESS OF BREATH. 12/23/19   Matthew Helper, MD  aspirin EC 81 MG tablet Take 81 mg by mouth daily.    [provider]  Blood Glucose Monitoring Suppl (ACCU-CHEK AVIVA PLUS) w/Device KIT accucheck aviva meter Once daily testing DX e11.9 07/09/19   Matthew Helper, MD  budesonide-formoterol (SYMBICORT) 80-4.5 MCG/ACT inhaler INHALE 2 PUFFS INTO LUNGS TWICE  DAILY. 12/23/19   Matthew Helper, MD  busPIRone (BUSPAR) 7.5 MG tablet TAKE 1 TABLET BY MOUTH 3 TIMES DAILY. 10/09/19   Matthew Helper, MD  GLIPIZIDE XL 5 MG 24 hr tablet TAKE 1 TABLET DAILY WITH BREAKFAST. 10/28/19   Matthew Helper, MD  glucose blood (ACCU-CHEK AVIVA PLUS) test strip USE TO TEST ONCE DAILY 07/07/19   Matthew Helper, MD  ipratropium-albuterol (DUONEB) 0.5-2.5 (3) MG/3ML SOLN USE 1 VIAL BY NEBULIZATION EVERY 4 TO 6 HOURS 10/24/19   Matthew Helper, MD  olmesartan (BENICAR) 40 MG tablet TAKE 1 TABLET BY MOUTH ONCE A DAY. 10/09/19   Matthew Helper, MD  pravastatin (PRAVACHOL) 20 MG tablet TAKE (1) TABLET BY MOUTH AT BEDTIME FOR CHOLESTEROL. 11/06/19   Matthew Helper, MD  risperiDONE (RISPERDAL) 1 MG tablet TAKE (1) TABLET BY MOUTH AT BEDTIME WITH '4MG'$ . 10/09/19   Matthew Helper, MD  risperidone (RISPERDAL) 4 MG tablet Takes with a 1 mg tablet at night to = 5 mg dose 10/08/19   Matthew Helper, MD  tiZANidine (ZANAFLEX) 2 MG tablet TAKE 1 TABLET ONCE DAILY  AS NEEDED FOR MUSCLE SPASMS. 10/28/19   Matthew Helper, MD  traZODone (DESYREL) 50 MG tablet Take 1 tablet (50 mg total) by mouth at bedtime as needed. for sleep 08/14/19   Matthew Helper, MD  triamterene-hydrochlorothiazide (MAXZIDE-25) 37.5-25 MG tablet Take 0.5 tablets by mouth daily. 10/08/19   Matthew Helper, MD  UNABLE TO FIND Nebulizer mouthpiece and tubing x 1  DX COPD 05/29/19   Matthew Helper, MD  UNABLE TO FIND XL pullups for daily use DX Incontinence 10/08/19   Matthew Helper, MD  UNABLE TO FIND Nebulizer tubing x 1  DX J44.9 10/14/19   Matthew Helper, MD  sildenafil (VIAGRA) 100 MG tablet Take 100 mg by mouth. Take one tablet 30 mins before intercourse   03/11/12  [provider]    Review of Systems:  Constitutional:  No weight loss, night sweats, Fevers, chills, fatigue.  Head&Eyes: No headache.  No vision loss.  No eye pain or  scotoma ENT:  No Difficulty swallowing,Tooth/dental problems,Sore throat,  No ear ache, post nasal drip,  Cardio-vascular:  No chest pain, Orthopnea, PND, swelling in lower extremities,  dizziness, palpitations  GI:  No  abdominal pain, nausea, vomiting, diarrhea, loss of appetite, melena, heartburn, indigestion, Resp:  No shortness of breath with exertion or at rest. No cough. No coughing up of blood .No wheezing.No chest wall deformity  Skin:  no rash or lesions.  GU:  no dysuria, change in color of urine, no urgency or frequency. No flank pain.  Musculoskeletal:  No joint pain or swelling. No decreased range of motion. No back pain.  Psych:  No change in mood or affect. Neurologic: No headache, no dysesthesia, no focal weakness, no vision loss. No syncope  Physical Exam: Vitals:   01/05/20 0950 01/05/20 1000 01/05/20 1010 01/05/20 1020  BP: 112/77 118/74 118/68 108/73  Pulse: (!) 103 (!) 108 (!) 108 (!) 104  Resp: (!) 23 (!) '23 18 20  '$ Temp:      TempSrc:      SpO2: 95% 95% 98% 98%  Weight:      Height:       General:  A&O x 3, NAD, nontoxic, pleasant/cooperative Head/Eye: No conjunctival hemorrhage, no icterus, [place]/AT, No nystagmus ENT:  No icterus,  No thrush, good dentition, no pharyngeal exudate Neck:  No masses, no lymphadenpathy, no bruits CV:  RRR, no rub, no gallop, no S3 Lung:  Diminished BS but CTAB, good air movement, no wheeze, no rhonchi Abdomen: soft/NT, +BS, nondistended, no peritoneal signs Ext: No cyanosis, No rashes, No petechiae, No lymphangitis, No edema Neuro: CNII-XII intact, strength 4/5 in bilateral upper and lower extremities, no dysmetria  Labs on Admission:  Basic Metabolic Panel: Recent Labs  Lab 01/05/20 0801 01/05/20 0810  NA 130* 130*  K 4.0 4.1  CL 99 99  CO2  --  20*  GLUCOSE 201* 204*  BUN 15 16  CREATININE 1.70* 1.59*  CALCIUM  --  9.2   Liver Function Tests: Recent Labs  Lab 01/05/20 0810  AST 31  ALT 47*  ALKPHOS 42   BILITOT 1.1  PROT 6.7  ALBUMIN 3.6   No results for input(s): LIPASE, AMYLASE in the last 168 hours. No results for input(s): AMMONIA in the last 168 hours. CBC: Recent Labs  Lab 01/05/20 0801 01/05/20 0810  WBC  --  12.5*  NEUTROABS  --  10.4*  HGB 12.9* 12.2*  HCT 38.0* 36.7*  MCV  --  90.2  PLT  --  248   Coagulation Profile: Recent Labs  Lab 01/05/20 0810  INR 1.1   Cardiac Enzymes: No results for input(s): CKTOTAL, CKMB, CKMBINDEX, TROPONINI in the last 168 hours. BNP: Invalid input(s): POCBNP CBG: No results for input(s): GLUCAP in the last 168 hours. Urine analysis:    Component Value Date/Time   COLORURINE lt. yellow 02/25/2009 1257   APPEARANCEUR Clear 02/25/2009 1257   LABSPEC 1.010 02/25/2009 1257   PHURINE 6.0 02/25/2009 1257   HGBUR negative 02/25/2009 1257   BILIRUBINUR negative 02/25/2009 1257   UROBILINOGEN 0.2 02/25/2009 1257   NITRITE negative 02/25/2009 1257   Sepsis Labs: '@LABRCNTIP'$ (procalcitonin:4,lacticidven:4) ) Recent Results (from the past 240 hour(s))  Respiratory Panel by RT PCR (Flu A&B, Covid) - Nasopharyngeal Swab     Status: None   Collection Time: 01/05/20  8:00 AM   Specimen: Nasopharyngeal Swab  Result Value Ref Range Status   SARS Coronavirus 2 by RT PCR NEGATIVE NEGATIVE Final    Comment: (NOTE) SARS-CoV-2 target nucleic acids are NOT DETECTED. The SARS-CoV-2 RNA is generally detectable in upper respiratoy specimens during the acute phase of infection. The lowest concentration of SARS-CoV-2 viral copies this assay can detect is 131 copies/mL. A negative result does not preclude SARS-Cov-2 infection and should not be used as the sole basis for treatment or other patient management decisions. A negative result may occur with  improper specimen collection/handling, submission of specimen other than nasopharyngeal swab, presence of viral mutation(s) within the areas targeted by this assay, and inadequate number of viral  copies (<131 copies/mL). A negative result must be combined with clinical observations, patient history, and epidemiological information. The expected result is Negative. Fact Sheet for Patients:  PinkCheek.be Fact Sheet for Healthcare Providers:  GravelBags.it This test is not yet ap proved or cleared by the [place] FDA and  has been authorized for detection and/or diagnosis of SARS-CoV-2 by FDA under an Emergency Use Authorization (EUA). This EUA will remain  in effect (meaning this test can be used) for the duration of the COVID-19 declaration under Section 564(b)(1) of the Act, 21 U.S.C. section 360bbb-3(b)(1), unless the authorization is terminated or revoked sooner.    Influenza A by PCR NEGATIVE NEGATIVE Final   Influenza B by PCR NEGATIVE NEGATIVE Final    Comment: (NOTE) The Xpert Xpress SARS-CoV-2/FLU/RSV assay is intended as an aid in  the diagnosis of influenza from Nasopharyngeal swab specimens and  should not be used as a sole basis for treatment. Nasal washings and  aspirates are unacceptable for Xpert Xpress SARS-CoV-2/FLU/RSV  testing. Fact Sheet for Patients: PinkCheek.be Fact Sheet for Healthcare Providers: GravelBags.it This test is not yet approved or cleared by the [place] FDA and  has been authorized for detection and/or diagnosis of SARS-CoV-2 by  FDA under an Emergency Use Authorization (EUA). This EUA will remain  in effect (meaning this test can be used) for the duration of the  Covid-19 declaration under Section 564(b)(1) of the Act, 21  U.S.C. section 360bbb-3(b)(1), unless the authorization is  terminated or revoked. Performed at [facility], [address], [place], [place] [address]      Radiological Exams on Admission: CT ABDOMEN PELVIS W CONTRAST  Result Date: 01/05/2020 CLINICAL DATA:  Melena. Copious amount of red  blood and clots coming from rectum. Abdomen hurting. EXAM: CT ABDOMEN AND PELVIS WITH CONTRAST TECHNIQUE: Multidetector CT imaging of the abdomen and pelvis was performed using the standard protocol following bolus administration  of intravenous contrast. CONTRAST:  19m OMNIPAQUE IOHEXOL 300 MG/ML  SOLN COMPARISON:  11/07/2019 FINDINGS: Lower chest: Small pleural effusions identified. Calcifications within the left circumflex and RCA coronary arteries. Aortic atherosclerosis. Hepatobiliary: No focal liver abnormality is seen. Tiny stone within the dependent portion of gallbladder measuring 4 mm. No gallbladder inflammation. No biliary ductal dilatation. Pancreas: Unremarkable. No pancreatic ductal dilatation or surrounding inflammatory changes. Spleen: Normal in size without focal abnormality. Adrenals/Urinary Tract: The right there is asymmetric left adrenal hypertrophy. Small right adrenal gland nodule measures 1.1 cm. This is unchanged from 2018. Likely representing a benign abnormality. Multiple right kidney cysts are identified. The largest arises from the upper pole measuring 5.3 cm. These wall are somewhat obscured by motion artifact. Several small left kidney cysts are also noted. No hydronephrosis identified bilaterally. Urinary bladder negative. Stomach/Bowel: Stomach in the small bowel loops are unremarkable. Diffuse colonic diverticulosis identified. There is a focal area of asymmetric inflammation involving the mid ascending colon likely reflecting acute diverticulitis. No significant free fluid or fluid collection. No abnormal bowel distension. Vascular/Lymphatic: Aortic atherosclerosis. There is no aneurysm. No abdominopelvic adenopathy identified. Reproductive: Mild prostate gland enlargement. Other: No free fluid or fluid collections. Periumbilical hernia contains fat only. There is also a small fat containing supraumbilical hernia. Musculoskeletal: L5-S1 degenerative disc disease. IMPRESSION: 1.  Examination is positive for acute diverticulitis involving the mid ascending colon. No perforation or abscess identified. 2. Small bilateral pleural effusions. 3. Aortic atherosclerosis and multi vessel coronary artery calcification. 4. Gallstone. Aortic Atherosclerosis (ICD10-I70.0). Electronically Signed   By: TKerby MoorsM.D.   On: 01/05/2020 09:18    EKG: Independently reviewed. Sinus, nonspecific Twave change    Time spent:60 minutes Code Status:   full Family Communication:  No Family at bedside Disposition Plan: expect 2-3 day hospitalization Consults called: GI DVT Prophylaxis:  SCDs  DOrson Eva DO  [facility] Pager [phone] If 7PM-7AM, please contact night-coverage [email or web address] Password [email or web address]1/18/2021, 10:32 AM

## 2020-01-06 DIAGNOSIS — D62 Acute posthemorrhagic anemia: Secondary | ICD-10-CM

## 2020-01-06 DIAGNOSIS — K5732 Diverticulitis of large intestine without perforation or abscess without bleeding: Secondary | ICD-10-CM

## 2020-01-06 DIAGNOSIS — J9611 Chronic respiratory failure with hypoxia: Secondary | ICD-10-CM

## 2020-01-06 LAB — HEMOGLOBIN AND HEMATOCRIT, BLOOD
HCT: 29.1 % — ABNORMAL LOW (ref 39.0–52.0)
HCT: 30.1 % — ABNORMAL LOW (ref 39.0–52.0)
HCT: 29.9 % — ABNORMAL LOW (ref 39.0–52.0)
HCT: 28.5 % — ABNORMAL LOW (ref 39.0–52.0)
Hemoglobin: 9.3 g/dL — ABNORMAL LOW (ref 13.0–17.0)
Hemoglobin: 9.6 g/dL — ABNORMAL LOW (ref 13.0–17.0)
Hemoglobin: 9.5 g/dL — ABNORMAL LOW (ref 13.0–17.0)
Hemoglobin: 9.1 g/dL — ABNORMAL LOW (ref 13.0–17.0)

## 2020-01-06 LAB — GLUCOSE, CAPILLARY
Glucose-Capillary: 133 mg/dL — ABNORMAL HIGH (ref 70–99)
Glucose-Capillary: 196 mg/dL — ABNORMAL HIGH (ref 70–99)
Glucose-Capillary: 107 mg/dL — ABNORMAL HIGH (ref 70–99)
Glucose-Capillary: 155 mg/dL — ABNORMAL HIGH (ref 70–99)

## 2020-01-06 LAB — COMPREHENSIVE METABOLIC PANEL
ALT: 38 U/L (ref 0–44)
AST: 24 U/L (ref 15–41)
Albumin: 3 g/dL — ABNORMAL LOW (ref 3.5–5.0)
Alkaline Phosphatase: 30 U/L — ABNORMAL LOW (ref 38–126)
Anion gap: 2 — ABNORMAL LOW (ref 5–15)
BUN: 13 mg/dL (ref 8–23)
CO2: 25 mmol/L (ref 22–32)
Calcium: 8.5 mg/dL — ABNORMAL LOW (ref 8.9–10.3)
Chloride: 103 mmol/L (ref 98–111)
Creatinine, Ser: 1.38 mg/dL — ABNORMAL HIGH (ref 0.61–1.24)
GFR calc Af Amer: 60 mL/min — ABNORMAL LOW (ref >60)
GFR calc non Af Amer: 51 mL/min — ABNORMAL LOW (ref >60)
Glucose, Bld: 120 mg/dL — ABNORMAL HIGH (ref 70–99)
Potassium: 3.7 mmol/L (ref 3.5–5.1)
Sodium: 130 mmol/L — ABNORMAL LOW (ref 135–145)
Total Bilirubin: 0.8 mg/dL (ref 0.3–1.2)
Total Protein: 5.7 g/dL — ABNORMAL LOW (ref 6.5–8.1)

## 2020-01-06 LAB — CBC
HCT: 28.8 % — ABNORMAL LOW (ref 39.0–52.0)
Hemoglobin: 9.3 g/dL — ABNORMAL LOW (ref 13.0–17.0)
MCH: 29.9 pg (ref 26.0–34.0)
MCHC: 32.3 g/dL (ref 30.0–36.0)
MCV: 92.6 fL (ref 80.0–100.0)
Platelets: 175 10*3/uL (ref 150–400)
RBC: 3.11 MIL/uL — ABNORMAL LOW (ref 4.22–5.81)
RDW: 13.7 % (ref 11.5–15.5)
WBC: 6.6 10*3/uL (ref 4.0–10.5)
nRBC: 0 % (ref 0.0–0.2)

## 2020-01-06 MED ORDER — PANTOPRAZOLE SODIUM 40 MG PO TBEC
40.0000 mg | DELAYED_RELEASE_TABLET | Freq: Every day | ORAL | Status: DC
  Administered 2020-01-06 – 2020-01-08 (×3): 40 mg via ORAL
  Filled 2020-01-06 (×3): qty 1

## 2020-01-06 MED ORDER — FLUTICASONE FUROATE-VILANTEROL 100-25 MCG/INH IN AEPB
1.0000 | INHALATION_SPRAY | Freq: Every day | RESPIRATORY_TRACT | Status: DC
  Administered 2020-01-07 – 2020-01-08 (×2): 1 via RESPIRATORY_TRACT

## 2020-01-06 NOTE — Progress Notes (Signed)
Patient has had several loose bloody bowel movements. Patient blood pressure has remained stable and patient has not been tachycardic.  Patient has not had any c/o chest pain or dizziness during shift.

## 2020-01-06 NOTE — Progress Notes (Signed)
PROGRESS NOTE  Matthew Molina ZOX:[id number] DOB: 1951-01-06 DOA: 01/05/2020 PCP: Fayrene Helper, MD  Brief History:   69 y.o. male with medical history of COPD, diabetes mellitus type 2, depression, hyperlipidemia, hypertension, tobacco abuse, CKD stage III presenting with rectal bleeding that began around 6 AM on the morning of 01/05/2020.  The patient stated that he had some right-sided abdominal pain associated with this.  He states that he passed a large volume amount of blood with clots.  The patient had been in his usual state of health prior to this episode.  He denied any fevers, chills, chest pain, shortness breath, coughing, nausea, vomiting, diarrhea.  Upon EMS arrival, the patient was noted to be hypotensive with a blood pressure of 70/40 and tachycardia with heart rate of 130s.  Patient states that he had an episode of small-volume hematochezia approximately 1 year prior to this admission but denies any medical care at that time.  He denies any hemoptysis, hematemesis, melena, dysuria, hematuria.  He denies any NSAIDs or alcohol use. In the emergency department, the patient was afebrile with soft blood pressures in the low 90s.  Oxygen saturation was 97-90% on room air.  Sodium was 130, potassium 4.1, serum creatinine 1.59.  LFTs showed AST 31, ALT 47, alk phosphatase 42, total bilirubin 1.1.  WBC 12.5, hemoglobin 12.2, platelets 240,000.  Lactic acid 1.8.  CT of the abdomen and pelvis showed diffuse diverticulosis with asymmetric area of focal inflammatory change in the ascending colon.  Assessment/Plan: Hematochezia/colitis/acute blood loss anemia -Concerned about ischemic colitis vs diverticulitis -Baseline hemoglobin~13 -continue to have bloody BMs, though pt states amount significantly decreasing -Continue Zosyn -GI consult appreciated -Clear liquid diet>>>full liquids -continue monitoring serial hemoglobin -Hold aspirin -IVF--continue  Chronic respiratory  failure with hypoxia -on 3L at night -stable  Diabetes mellitus type 2 -Hemoglobin A1c--6.8 -NovoLog sliding scale  Essential hypertension -Holding olmesartan and Maxide secondary to soft blood pressures initially  CKD stage IIIa -Baseline creatinine 1.2-1.4 -A.m. BMP  COPD/tobacco abuse -Tobacco cessation discussed -Start duo nebs -Continue Symbicort I have discussed tobacco cessation with the patient.  I have counseled the patient regarding the negative impacts of continued tobacco use including but not limited to lung cancer, COPD, and cardiovascular disease.  I have discussed alternatives to tobacco and modalities that may help facilitate tobacco cessation including but not limited to biofeedback, hypnosis, and medications.  Total time spent with tobacco counseling was 4 minutes.   Hyperlipidemia -Continue statin        Disposition Plan:   Home in 1-2 days when cleared by GI Family Communication:   Family at bedside  Consultants:  [facility]  Code Status:  FULL  DVT Prophylaxis:  SCDs   Procedures: As Listed in Progress Note Above  Antibiotics: Zosyn 1/18>>>      Subjective: Pt states abd pain is improving and amount of blood per rectum is decreasing.  States stool consistency is "thicker".  Denies f/c, cp, sob, n/v.  Objective: Vitals:   01/05/20 2048 01/05/20 2124 01/06/20 0500 01/06/20 0730  BP:  107/78 125/79   Pulse:  91 89   Resp:  18 20   Temp:  99 F (37.2 C) 98 F (36.7 C)   TempSrc:  Oral Oral   SpO2: 98% 99% 97% 100%  Weight:      Height:        Intake/Output Summary (Last 24 hours) at 01/06/2020 1238 Last data  filed at 01/05/2020 1800 Gross per 24 hour  Intake 393.92 ml  Output --  Net 393.92 ml   Weight change:  Exam:   General:  Pt is alert, follows commands appropriately, not in acute distress  HEENT: No icterus, No thrush, No neck mass, [place]/AT  Cardiovascular: RRR, S1/S2, no rubs, no gallops  Respiratory:  diminished BS, bibasilar rales. No wheeze  Abdomen: Soft/+BS, RLQ tender, non distended, no guarding  Extremities: No edema, No lymphangitis, No petechiae, No rashes, no synovitis   Data Reviewed: I have personally reviewed following labs and imaging studies Basic Metabolic Panel: Recent Labs  Lab 01/05/20 0801 01/05/20 0810 01/06/20 0140  NA 130* 130* 130*  K 4.0 4.1 3.7  CL 99 99 103  CO2  --  20* 25  GLUCOSE 201* 204* 120*  BUN '15 16 13  '$ CREATININE 1.70* 1.59* 1.38*  CALCIUM  --  9.2 8.5*   Liver Function Tests: Recent Labs  Lab 01/05/20 0810 01/06/20 0140  AST 31 24  ALT 47* 38  ALKPHOS 42 30*  BILITOT 1.1 0.8  PROT 6.7 5.7*  ALBUMIN 3.6 3.0*   No results for input(s): LIPASE, AMYLASE in the last 168 hours. No results for input(s): AMMONIA in the last 168 hours. Coagulation Profile: Recent Labs  Lab 01/05/20 0810  INR 1.1   CBC: Recent Labs  Lab 01/05/20 0810 01/05/20 1505 01/05/20 1936 01/06/20 0140 01/06/20 0844  WBC 12.5*  --   --  6.6  --   NEUTROABS 10.4*  --   --   --   --   HGB 12.2* 10.6* 9.6* 9.3*  9.3* 9.6*  HCT 36.7* 32.9* 29.9* 28.8*  29.1* 30.1*  MCV 90.2  --   --  92.6  --   PLT 248  --   --  175  --    Cardiac Enzymes: No results for input(s): CKTOTAL, CKMB, CKMBINDEX, TROPONINI in the last 168 hours. BNP: Invalid input(s): POCBNP CBG: Recent Labs  Lab 01/05/20 1204 01/05/20 1642 01/05/20 2125 01/06/20 0813 01/06/20 1110  GLUCAP 153* 117* 131* 133* 196*   HbA1C: Recent Labs    01/05/20 1505  HGBA1C 6.8*   Urine analysis:    Component Value Date/Time   COLORURINE lt. yellow 02/25/2009 1257   APPEARANCEUR Clear 02/25/2009 1257   LABSPEC 1.010 02/25/2009 1257   PHURINE 6.0 02/25/2009 1257   HGBUR negative 02/25/2009 1257   BILIRUBINUR negative 02/25/2009 1257   UROBILINOGEN 0.2 02/25/2009 1257   NITRITE negative 02/25/2009 1257   Sepsis Labs: '@LABRCNTIP'$ (procalcitonin:4,lacticidven:4) ) Recent Results (from  the past 240 hour(s))  Respiratory Panel by RT PCR (Flu A&B, Covid) - Nasopharyngeal Swab     Status: None   Collection Time: 01/05/20  8:00 AM   Specimen: Nasopharyngeal Swab  Result Value Ref Range Status   SARS Coronavirus 2 by RT PCR NEGATIVE NEGATIVE Final    Comment: (NOTE) SARS-CoV-2 target nucleic acids are NOT DETECTED. The SARS-CoV-2 RNA is generally detectable in upper respiratoy specimens during the acute phase of infection. The lowest concentration of SARS-CoV-2 viral copies this assay can detect is 131 copies/mL. A negative result does not preclude SARS-Cov-2 infection and should not be used as the sole basis for treatment or other patient management decisions. A negative result may occur with  improper specimen collection/handling, submission of specimen other than nasopharyngeal swab, presence of viral mutation(s) within the areas targeted by this assay, and inadequate number of viral copies (<131 copies/mL). A negative result must  be combined with clinical observations, patient history, and epidemiological information. The expected result is Negative. Fact Sheet for Patients:  PinkCheek.be Fact Sheet for Healthcare Providers:  GravelBags.it This test is not yet ap proved or cleared by the [place] FDA and  has been authorized for detection and/or diagnosis of SARS-CoV-2 by FDA under an Emergency Use Authorization (EUA). This EUA will remain  in effect (meaning this test can be used) for the duration of the COVID-19 declaration under Section 564(b)(1) of the Act, 21 U.S.C. section 360bbb-3(b)(1), unless the authorization is terminated or revoked sooner.    Influenza A by PCR NEGATIVE NEGATIVE Final   Influenza B by PCR NEGATIVE NEGATIVE Final    Comment: (NOTE) The Xpert Xpress SARS-CoV-2/FLU/RSV assay is intended as an aid in  the diagnosis of influenza from Nasopharyngeal swab specimens and  should  not be used as a sole basis for treatment. Nasal washings and  aspirates are unacceptable for Xpert Xpress SARS-CoV-2/FLU/RSV  testing. Fact Sheet for Patients: PinkCheek.be Fact Sheet for Healthcare Providers: GravelBags.it This test is not yet approved or cleared by the [place] FDA and  has been authorized for detection and/or diagnosis of SARS-CoV-2 by  FDA under an Emergency Use Authorization (EUA). This EUA will remain  in effect (meaning this test can be used) for the duration of the  Covid-19 declaration under Section 564(b)(1) of the Act, 21  U.S.C. section 360bbb-3(b)(1), unless the authorization is  terminated or revoked. Performed at [facility], [address], [place], [place] [address]   MRSA PCR Screening     Status: None   Collection Time: 01/05/20  4:08 PM   Specimen: Nasal Mucosa; Nasopharyngeal  Result Value Ref Range Status   MRSA by PCR NEGATIVE NEGATIVE Final    Comment:        The GeneXpert MRSA Assay (FDA approved for NASAL specimens only), is one component of a comprehensive MRSA colonization surveillance program. It is not intended to diagnose MRSA infection nor to guide or monitor treatment for MRSA infections. Performed at [facility], [address], [place], [place] [address]      Scheduled Meds: . busPIRone  7.5 mg Oral TID  . [START ON 01/07/2020] fluticasone furoate-vilanterol  1 puff Inhalation Daily  . insulin aspart  0-9 Units Subcutaneous TID WC  . ipratropium-albuterol  3 mL Nebulization TID  . pantoprazole  40 mg Oral Daily  . pravastatin  20 mg Oral q1800  . risperiDONE  1 mg Oral QHS  . risperidone  4 mg Oral QHS   Continuous Infusions: . piperacillin-tazobactam (ZOSYN)  IV 3.375 g (01/06/20 0603)    Procedures/Studies: CT ABDOMEN PELVIS W CONTRAST  Result Date: 01/05/2020 CLINICAL DATA:  Melena. Copious amount of red blood and clots coming from rectum. Abdomen  hurting. EXAM: CT ABDOMEN AND PELVIS WITH CONTRAST TECHNIQUE: Multidetector CT imaging of the abdomen and pelvis was performed using the standard protocol following bolus administration of intravenous contrast. CONTRAST:  18m OMNIPAQUE IOHEXOL 300 MG/ML  SOLN COMPARISON:  11/07/2019 FINDINGS: Lower chest: Small pleural effusions identified. Calcifications within the left circumflex and RCA coronary arteries. Aortic atherosclerosis. Hepatobiliary: No focal liver abnormality is seen. Tiny stone within the dependent portion of gallbladder measuring 4 mm. No gallbladder inflammation. No biliary ductal dilatation. Pancreas: Unremarkable. No pancreatic ductal dilatation or surrounding inflammatory changes. Spleen: Normal in size without focal abnormality. Adrenals/Urinary Tract: The right there is asymmetric left adrenal hypertrophy. Small right adrenal gland nodule measures 1.1 cm. This  is unchanged from 2018. Likely representing a benign abnormality. Multiple right kidney cysts are identified. The largest arises from the upper pole measuring 5.3 cm. These wall are somewhat obscured by motion artifact. Several small left kidney cysts are also noted. No hydronephrosis identified bilaterally. Urinary bladder negative. Stomach/Bowel: Stomach in the small bowel loops are unremarkable. Diffuse colonic diverticulosis identified. There is a focal area of asymmetric inflammation involving the mid ascending colon likely reflecting acute diverticulitis. No significant free fluid or fluid collection. No abnormal bowel distension. Vascular/Lymphatic: Aortic atherosclerosis. There is no aneurysm. No abdominopelvic adenopathy identified. Reproductive: Mild prostate gland enlargement. Other: No free fluid or fluid collections. Periumbilical hernia contains fat only. There is also a small fat containing supraumbilical hernia. Musculoskeletal: L5-S1 degenerative disc disease. IMPRESSION: 1. Examination is positive for acute  diverticulitis involving the mid ascending colon. No perforation or abscess identified. 2. Small bilateral pleural effusions. 3. Aortic atherosclerosis and multi vessel coronary artery calcification. 4. Gallstone. Aortic Atherosclerosis (ICD10-I70.0). Electronically Signed   By: Kerby Moors M.D.   On: 01/05/2020 09:18    Orson Eva, DO  [facility] Pager [phone]  If 7PM-7AM, please contact night-coverage [email or web address] Password [email or web address] 01/06/2020, 12:38 PM   LOS: 1 day

## 2020-01-06 NOTE — Progress Notes (Signed)
Subjective: Denies abdominal pain. Feels rectal bleeding is decreasing. States he still has a small amount of blood when having a BM. Last BM was early this morning. Can't remember the time. Denies melena. No nausea or vomiting. Tolerated full liquid dinner well. Denies diarrhea. States he felt lightheaded when he first presented to the ED but that has resolved.   Spoke with nursing staff. Per report, they were told patient had bloody stools over night. Number of stools, stool consistency, and timing of last stool was not reported. Amount of rectal bleeding, whether this seemed to be persistent, decreasing, or worsening was also not reported.  Objective: Vital signs in last 24 hours: Temp:  [98 F (36.7 C)-99 F (37.2 C)] 98 F (36.7 C) (01/19 0500) Pulse Rate:  [89-116] 89 (01/19 0500) Resp:  [18-26] 20 (01/19 0500) BP: (100-125)/(66-93) 125/79 (01/19 0500) SpO2:  [90 %-100 %] 100 % (01/19 0730) Last BM Date: 01/06/20 General:   Alert and oriented, pleasant, no acute distress.  Head:  Normocephalic and atraumatic. Eyes:  No icterus, sclera clear.  Abdomen:  Bowel sounds present, soft, non-distended. Mild tenderness to palpation in the RLQ and LLQ. No rebound or guarding. No masses appreciated  Msk:  Symmetrical without gross deformities.  Extremities:  Without edema. Neurologic:  Alert and  oriented x4;  grossly normal neurologically. Skin:  Warm and dry, intact without significant lesions.  Psych: Normal mood and affect.  Intake/Output from previous day: 01/18 0701 - 01/19 0700 In: 2438.8 [I.V.:47.5; IV Piggyback:2391.2] Out: -  Intake/Output this shift: No intake/output data recorded.  Lab Results: Recent Labs    01/05/20 0810 01/05/20 1505 01/05/20 1936 01/06/20 0140 01/06/20 0844  WBC 12.5*  --   --  6.6  --   HGB 12.2*   < > 9.6* 9.3*  9.3* 9.6*  HCT 36.7*   < > 29.9* 28.8*  29.1* 30.1*  PLT 248  --   --  175  --    < > = values in this interval not  displayed.   BMET Recent Labs    01/05/20 0801 01/05/20 0810 01/06/20 0140  NA 130* 130* 130*  K 4.0 4.1 3.7  CL 99 99 103  CO2  --  20* 25  GLUCOSE 201* 204* 120*  BUN 15 16 13   CREATININE 1.70* 1.59* 1.38*  CALCIUM  --  9.2 8.5*   LFT Recent Labs    01/05/20 0810 01/06/20 0140  PROT 6.7 5.7*  ALBUMIN 3.6 3.0*  AST 31 24  ALT 47* 38  ALKPHOS 42 30*  BILITOT 1.1 0.8   PT/INR Recent Labs    01/05/20 0810  LABPROT 13.9  INR 1.1   Studies/Results: CT ABDOMEN PELVIS W CONTRAST  Result Date: 01/05/2020 CLINICAL DATA:  Melena. Copious amount of red blood and clots coming from rectum. Abdomen hurting. EXAM: CT ABDOMEN AND PELVIS WITH CONTRAST TECHNIQUE: Multidetector CT imaging of the abdomen and pelvis was performed using the standard protocol following bolus administration of intravenous contrast. CONTRAST:  36mL OMNIPAQUE IOHEXOL 300 MG/ML  SOLN COMPARISON:  11/07/2019 FINDINGS: Lower chest: Small pleural effusions identified. Calcifications within the left circumflex and RCA coronary arteries. Aortic atherosclerosis. Hepatobiliary: No focal liver abnormality is seen. Tiny stone within the dependent portion of gallbladder measuring 4 mm. No gallbladder inflammation. No biliary ductal dilatation. Pancreas: Unremarkable. No pancreatic ductal dilatation or surrounding inflammatory changes. Spleen: Normal in size without focal abnormality. Adrenals/Urinary Tract: The right there is asymmetric left adrenal hypertrophy.  Small right adrenal gland nodule measures 1.1 cm. This is unchanged from 2018. Likely representing a benign abnormality. Multiple right kidney cysts are identified. The largest arises from the upper pole measuring 5.3 cm. These wall are somewhat obscured by motion artifact. Several small left kidney cysts are also noted. No hydronephrosis identified bilaterally. Urinary bladder negative. Stomach/Bowel: Stomach in the small bowel loops are unremarkable. Diffuse colonic  diverticulosis identified. There is a focal area of asymmetric inflammation involving the mid ascending colon likely reflecting acute diverticulitis. No significant free fluid or fluid collection. No abnormal bowel distension. Vascular/Lymphatic: Aortic atherosclerosis. There is no aneurysm. No abdominopelvic adenopathy identified. Reproductive: Mild prostate gland enlargement. Other: No free fluid or fluid collections. Periumbilical hernia contains fat only. There is also a small fat containing supraumbilical hernia. Musculoskeletal: L5-S1 degenerative disc disease. IMPRESSION: 1. Examination is positive for acute diverticulitis involving the mid ascending colon. No perforation or abscess identified. 2. Small bilateral pleural effusions. 3. Aortic atherosclerosis and multi vessel coronary artery calcification. 4. Gallstone. Aortic Atherosclerosis (ICD10-I70.0). Electronically Signed   By: Kerby Moors M.D.   On: 01/05/2020 09:18    Assessment: 69 year old male presenting with large volume rectal bleeding, hypotension now improved s/p multiple NS boluses, and CT findings with acute uncomplicated diverticulitis involving mid ascending colon. He was started on Zosyn for diverticulitis. He denies abdominal pain. Exam with mild tenderness to palpation in RLQ and LLQ. Tolerating full liquid diet well.    Rectal bleeding: Nursing note states patient had several loose bloody BMs over night. Patient reports rectal bleeding is tapering off with last BM early this morning. Hemoglobin 12.9 on presentation yesterday, today hemoglobin is 9.6 which is stable since yesterday afternoon. The fact that his hemoglobin has remained stable overnight is encouraging. Suspect drop in hemoglobin is related to equilibration/dilutional. Blood noted in stool may be residual blood in the colon from initial bleeding event.   Etiology of rectal bleeding not entirely clear. Rectal bleeding not typical of diverticulitis. Could be bleeding  from a separate diverticula within the colon. Possible ischemic colitis with hypotension on presentation. He does note taking BC powders routinely at least once per day due to arthritic pain. Also on daily 81 mg aspirin. Can't rule out rapid upper GI bleed, but would expect this to be less likely. BUN has been normal. Last colonoscopy in 2014 with documented pan-colonic diverticulosis. Last EGD in March 2017 with stricture at GE junction, moderate erosive gastritis and mild non-erosive duodenitis. +H.pylori. Treated initially with Prevpac. Breath test to check for eradication was positive, and he was prescribed Pylera. At this point, would continue supportive measures and monitor for persistent bleeding. No colonoscopy with acute diverticulitis.    Plan: Continue IV antibiotics for diverticulitis Add Protonix 40 mg daily Follow H/H Continue to monitor for ongoing/persistent bleeding.  Continue supportive measures. Avoid all NSAIDs.  He will need colonoscopy as an outpatient in 6-8 weeks pending resolution of diverticulitis.    LOS: 1 day    01/06/2020, 10:11 AM   Aliene Altes, PA-C [facility]

## 2020-01-07 DIAGNOSIS — N1831 Chronic kidney disease, stage 3a: Secondary | ICD-10-CM

## 2020-01-07 DIAGNOSIS — Z720 Tobacco use: Secondary | ICD-10-CM

## 2020-01-07 DIAGNOSIS — J9611 Chronic respiratory failure with hypoxia: Secondary | ICD-10-CM

## 2020-01-07 DIAGNOSIS — K559 Vascular disorder of intestine, unspecified: Secondary | ICD-10-CM

## 2020-01-07 DIAGNOSIS — K625 Hemorrhage of anus and rectum: Secondary | ICD-10-CM

## 2020-01-07 LAB — CBC
HCT: 27.8 % — ABNORMAL LOW (ref 39.0–52.0)
Hemoglobin: 9 g/dL — ABNORMAL LOW (ref 13.0–17.0)
MCH: 30 pg (ref 26.0–34.0)
MCHC: 32.4 g/dL (ref 30.0–36.0)
MCV: 92.7 fL (ref 80.0–100.0)
Platelets: 174 10*3/uL (ref 150–400)
RBC: 3 MIL/uL — ABNORMAL LOW (ref 4.22–5.81)
RDW: 13.4 % (ref 11.5–15.5)
WBC: 5.3 10*3/uL (ref 4.0–10.5)
nRBC: 0 % (ref 0.0–0.2)

## 2020-01-07 LAB — GLUCOSE, CAPILLARY
Glucose-Capillary: 150 mg/dL — ABNORMAL HIGH (ref 70–99)
Glucose-Capillary: 170 mg/dL — ABNORMAL HIGH (ref 70–99)
Glucose-Capillary: 111 mg/dL — ABNORMAL HIGH (ref 70–99)
Glucose-Capillary: 176 mg/dL — ABNORMAL HIGH (ref 70–99)

## 2020-01-07 LAB — BASIC METABOLIC PANEL
Anion gap: 6 (ref 5–15)
BUN: 9 mg/dL (ref 8–23)
CO2: 24 mmol/L (ref 22–32)
Calcium: 8.9 mg/dL (ref 8.9–10.3)
Chloride: 100 mmol/L (ref 98–111)
Creatinine, Ser: 1.31 mg/dL — ABNORMAL HIGH (ref 0.61–1.24)
GFR calc Af Amer: >60 mL/min (ref >60)
GFR calc non Af Amer: 55 mL/min — ABNORMAL LOW (ref >60)
Glucose, Bld: 147 mg/dL — ABNORMAL HIGH (ref 70–99)
Potassium: 3.5 mmol/L (ref 3.5–5.1)
Sodium: 130 mmol/L — ABNORMAL LOW (ref 135–145)

## 2020-01-07 LAB — HEMOGLOBIN AND HEMATOCRIT, BLOOD
HCT: 28.6 % — ABNORMAL LOW (ref 39.0–52.0)
Hemoglobin: 9.2 g/dL — ABNORMAL LOW (ref 13.0–17.0)

## 2020-01-07 NOTE — Progress Notes (Signed)
PROGRESS NOTE    Matthew Molina  YPP:[id number] DOB: August 24, 1951 DOA: 01/05/2020 PCP: Fayrene Helper, MD     Brief Narrative:  69 y.o.malewith medical history ofCOPD, diabetes mellitus type 2, depression, hyperlipidemia, hypertension, tobacco abuse, CKD stage III presenting with rectal bleeding that began around 6 AM on the morning of 01/05/2020. The patient stated that he had some right-sided abdominal pain associated with this. He states that he passed a large volume amount of blood with clots. The patient had been in his usual state of health prior to this episode. He denied any fevers, chills, chest pain, shortness breath, coughing, nausea, vomiting, diarrhea. Upon EMS arrival, the patient was noted to be hypotensive with a blood pressure of 70/40 and tachycardia with heart rate of 130s. Patient states that he had an episode of small-volume hematochezia approximately 1 year prior to this admission but denies any medical care at that time. He denies any hemoptysis, hematemesis, melena, dysuria, hematuria. He denies any NSAIDs or alcohol use. In the emergency department, the patient was afebrile with soft blood pressures in the low 90s. Oxygen saturation was 97-90% on room air. Sodium was 130, potassium 4.1, serum creatinine 1.59. LFTs showed AST 31, ALT 47, alk phosphatase 42, total bilirubin 1.1. WBC 12.5, hemoglobin 12.2, platelets 240,000. Lactic acid 1.8. CT of the abdomen and pelvis showed diffuse diverticulosis with asymmetric area of focal inflammatory change in the ascending colon.   Assessment & Plan: 1-acute blood loss anemia in the setting of diverticulitis/ischemic colitis -Stabilizing/improving -Significant decrease in the amount of blood appreciated in his bowel movements -Hemoglobin 9.0 and stable today -No lightheadedness, chest pain or shortness of breath -Patient denies nausea, vomiting and abdominal pain. -Diet will be advanced to soft diet -Continue PPI  and antibiotics. -If he tolerates diet and otherwise remains stable will discharge home tomorrow with instruction to follow-up with GI for colonoscopy evaluation in approximately 6 to 8 weeks.  2-Tobacco abuse -Cessation counseling provided.  3-CKD (chronic kidney disease) stage 3, GFR 30-59 ml/min (HCC) -Stage IIIa at baseline -Creatinine 1.3; normally 1.2-1.4 fluctuating. -Advised to maintain adequate hydration -Repeat basic metabolic panel follow-up visit to reassess stability and creatinine trend.  4-Chronic respiratory failure with hypoxia (HCC) -Appears to be stable and at baseline -Patient chronically uses 3 L at nighttime only. -Good O2 sats have been reported on his vital signs.  5-essential hypertension -Continue holding olmesartan and Maxide secondary to soft blood pressures and acute bleeding -Follow vital signs.  6-history of COPD -No wheezing -No complaints of shortness of breath -Continue as needed DuoNeb and continue Symbicort (or equivalent)  DVT prophylaxis: SCDs Code Status: Full code Family Communication: No family at bedside.  All questions answered to patient's satisfaction. Disposition Plan: Advance diet to soft, continue to follow hemoglobin trend and continue oral PPI.  If remains stable and tolerates diet will go home tomorrow 01/08/2020  Consultants:   Gastroenterology service  Procedures:   See below for x-ray reports  Antimicrobials:  Anti-infectives (From admission, onward)   Start     Dose/Rate Route Frequency Ordered Stop   01/05/20 1400  piperacillin-tazobactam (ZOSYN) IVPB 3.375 g     3.375 g 12.5 mL/hr over 240 Minutes Intravenous Every 8 hours 01/05/20 0933     01/05/20 1400  piperacillin-tazobactam (ZOSYN) IVPB 3.375 g  Status:  Discontinued     3.375 g 12.5 mL/hr over 240 Minutes Intravenous Every 8 hours 01/05/20 1158 01/05/20 1201   01/05/20 0945  piperacillin-tazobactam (ZOSYN) IVPB 3.375  g     3.375 g 100 mL/hr over 30 Minutes  Intravenous  Once 01/05/20 0933 01/05/20 1012       Subjective: No fever, no chest pain, no nausea, no vomiting.  Reports no abdominal pain, no lightheadedness or shortness of breath.  Reports significant improvement in the amount of blood in his stools; no melena.  Objective: Vitals:   01/06/20 2121 01/07/20 0503 01/07/20 0844 01/07/20 1207  BP: 124/78 140/89  (!) 149/89  Pulse: 93 96  98  Resp: 20 20    Temp: 97.8 F (36.6 C) (!) 97.5 F (36.4 C)    TempSrc: Oral Oral    SpO2: 96% 100% 98%   Weight:      Height:        Intake/Output Summary (Last 24 hours) at 01/07/2020 1236 Last data filed at 01/07/2020 0900 Gross per 24 hour  Intake 1080 ml  Output --  Net 1080 ml   Filed Weights   01/05/20 0745  Weight: 108.9 kg    Examination: General exam: Alert, awake, oriented x 3; reports no nausea, no vomiting.  No abdominal pain.  Reports significant improvement in the amount of blood/color in his stools.  Per nursing staff had a maroon color stool before 7 PM last night since then although his bowel movements were brown and no suggesting active bleeding. Respiratory system: Clear to auscultation. Respiratory effort normal.  Good O2 sat on room air. Cardiovascular system:RRR. No murmurs, rubs, gallops.  No JVD. Gastrointestinal system: Abdomen is nondistended, soft and nontender. No organomegaly or masses felt. Normal bowel sounds heard. Central nervous system: Alert and oriented. No focal neurological deficits. Extremities: No cyanosis or clubbing. Skin: No rashes, lesions or ulcers Psychiatry: Judgement and insight appear normal. Mood & affect appropriate.     Data Reviewed: I have personally reviewed following labs and imaging studies  CBC: Recent Labs  Lab 01/05/20 0810 01/05/20 1505 01/06/20 0140 01/06/20 0140 01/06/20 0844 01/06/20 1431 01/06/20 2040 01/07/20 0149 01/07/20 0836  WBC 12.5*  --  6.6  --   --   --   --  5.3  --   NEUTROABS 10.4*  --   --   --    --   --   --   --   --   HGB 12.2*   < > 9.3*  9.3*   < > 9.6* 9.5* 9.1* 9.0* 9.2*  HCT 36.7*   < > 28.8*  29.1*   < > 30.1* 29.9* 28.5* 27.8* 28.6*  MCV 90.2  --  92.6  --   --   --   --  92.7  --   PLT 248  --  175  --   --   --   --  174  --    < > = values in this interval not displayed.   Basic Metabolic Panel: Recent Labs  Lab 01/05/20 0801 01/05/20 0810 01/06/20 0140 01/07/20 0149  NA 130* 130* 130* 130*  K 4.0 4.1 3.7 3.5  CL 99 99 103 100  CO2  --  20* 25 24  GLUCOSE 201* 204* 120* 147*  BUN _0 CREATININE 1.70* 1.59* 1.38* 1.31*  CALCIUM  --  9.2 8.5* 8.9   GFR: Estimated Creatinine Clearance: 67.9 mL/min (A) (by C-G formula based on SCr of 1.31 mg/dL (H)).   Liver Function Tests: Recent Labs  Lab 01/05/20 0810 01/06/20 0140  AST 31 24  ALT 47* 38  ALKPHOS 42 30*  BILITOT 1.1 0.8  PROT 6.7 5.7*  ALBUMIN 3.6 3.0*   Coagulation Profile: Recent Labs  Lab 01/05/20 0810  INR 1.1   HbA1C: Recent Labs    01/05/20 1505  HGBA1C 6.8*   CBG: Recent Labs  Lab 01/06/20 1110 01/06/20 1632 01/06/20 2123 01/07/20 0721 01/07/20 1106  GLUCAP 196* 107* 155* 150* 170*   Urine analysis:    Component Value Date/Time   COLORURINE lt. yellow 02/25/2009 1257   APPEARANCEUR Clear 02/25/2009 1257   LABSPEC 1.010 02/25/2009 1257   PHURINE 6.0 02/25/2009 1257   HGBUR negative 02/25/2009 1257   BILIRUBINUR negative 02/25/2009 1257   UROBILINOGEN 0.2 02/25/2009 1257   NITRITE negative 02/25/2009 1257    Recent Results (from the past 240 hour(s))  Respiratory Panel by RT PCR (Flu A&B, Covid) - Nasopharyngeal Swab     Status: None   Collection Time: 01/05/20  8:00 AM   Specimen: Nasopharyngeal Swab  Result Value Ref Range Status   SARS Coronavirus 2 by RT PCR NEGATIVE NEGATIVE Final    Comment: (NOTE) SARS-CoV-2 target nucleic acids are NOT DETECTED. The SARS-CoV-2 RNA is generally detectable in upper respiratoy specimens during the acute phase  of infection. The lowest concentration of SARS-CoV-2 viral copies this assay can detect is 131 copies/mL. A negative result does not preclude SARS-Cov-2 infection and should not be used as the sole basis for treatment or other patient management decisions. A negative result may occur with  improper specimen collection/handling, submission of specimen other than nasopharyngeal swab, presence of viral mutation(s) within the areas targeted by this assay, and inadequate number of viral copies (<131 copies/mL). A negative result must be combined with clinical observations, patient history, and epidemiological information. The expected result is Negative. Fact Sheet for Patients:  PinkCheek.be Fact Sheet for Healthcare Providers:  GravelBags.it This test is not yet ap proved or cleared by the [place] FDA and  has been authorized for detection and/or diagnosis of SARS-CoV-2 by FDA under an Emergency Use Authorization (EUA). This EUA will remain  in effect (meaning this test can be used) for the duration of the COVID-19 declaration under Section 564(b)(1) of the Act, 21 U.S.C. section 360bbb-3(b)(1), unless the authorization is terminated or revoked sooner.    Influenza A by PCR NEGATIVE NEGATIVE Final   Influenza B by PCR NEGATIVE NEGATIVE Final    Comment: (NOTE) The Xpert Xpress SARS-CoV-2/FLU/RSV assay is intended as an aid in  the diagnosis of influenza from Nasopharyngeal swab specimens and  should not be used as a sole basis for treatment. Nasal washings and  aspirates are unacceptable for Xpert Xpress SARS-CoV-2/FLU/RSV  testing. Fact Sheet for Patients: PinkCheek.be Fact Sheet for Healthcare Providers: GravelBags.it This test is not yet approved or cleared by the [place] FDA and  has been authorized for detection and/or diagnosis of SARS-CoV-2 by  FDA  under an Emergency Use Authorization (EUA). This EUA will remain  in effect (meaning this test can be used) for the duration of the  Covid-19 declaration under Section 564(b)(1) of the Act, 21  U.S.C. section 360bbb-3(b)(1), unless the authorization is  terminated or revoked. Performed at [facility], [address], [place], [place] [address]   MRSA PCR Screening     Status: None   Collection Time: 01/05/20  4:08 PM   Specimen: Nasal Mucosa; Nasopharyngeal  Result Value Ref Range Status   MRSA by PCR NEGATIVE NEGATIVE Final    Comment:  The GeneXpert MRSA Assay (FDA approved for NASAL specimens only), is one component of a comprehensive MRSA colonization surveillance program. It is not intended to diagnose MRSA infection nor to guide or monitor treatment for MRSA infections. Performed at [facility], [address], [place], [place] [address]       Radiology Studies: No results found.   Scheduled Meds: . busPIRone  7.5 mg Oral TID  . fluticasone furoate-vilanterol  1 puff Inhalation Daily  . insulin aspart  0-9 Units Subcutaneous TID WC  . ipratropium-albuterol  3 mL Nebulization TID  . pantoprazole  40 mg Oral Daily  . pravastatin  20 mg Oral q1800  . risperiDONE  1 mg Oral QHS  . risperidone  4 mg Oral QHS   Continuous Infusions: . piperacillin-tazobactam (ZOSYN)  IV 3.375 g (01/07/20 0500)     LOS: 2 days    Time spent: 30 minutes     Barton Dubois, MD [facility] Pager [phone]   01/07/2020, 12:36 PM

## 2020-01-07 NOTE — Progress Notes (Signed)
Subjective:  Patient really wants to go home. States his bleeding has tapered. Although states he may have a BM that looks like less blood but then the next time could have more blood. Per nursing staff, he had a maroon colored stool before 7pm last night. The last two BMs were more brown. No black stools. Patient feels hungry. Denies abdominal pain with meals.   Objective: Vital signs in last 24 hours: Temp:  [97.5 F (36.4 C)-97.8 F (36.6 C)] 97.5 F (36.4 C) (01/20 0503) Pulse Rate:  [93-96] 96 (01/20 0503) Resp:  [20] 20 (01/20 0503) BP: (124-140)/(78-89) 140/89 (01/20 0503) SpO2:  [95 %-100 %] 100 % (01/20 0503) Last BM Date: 01/06/20 General:   Alert,  Well-developed, well-nourished, pleasant and cooperative in NAD Head:  Normocephalic and atraumatic. Eyes:  Sclera clear, no icterus.  Abdomen:  Soft, mild lower abdominal tenderness and nondistended. Normal bowel sounds, without guarding, and without rebound.   Extremities:  Without clubbing, deformity or edema. Neurologic:  Alert and  oriented x4;  grossly normal neurologically. Skin:  Intact without significant lesions or rashes. Psych:  Alert and cooperative. Normal mood and affect.  Intake/Output from previous day: 01/19 0701 - 01/20 0700 In: 1080 [P.O.:1080] Out: -  Intake/Output this shift: No intake/output data recorded.  Lab Results: CBC Recent Labs    01/05/20 0810 01/05/20 1505 01/06/20 0140 01/06/20 0844 01/06/20 1431 01/06/20 2040 01/07/20 0149  WBC 12.5*  --  6.6  --   --   --  5.3  HGB 12.2*   < > 9.3*  9.3*   < > 9.5* 9.1* 9.0*  HCT 36.7*   < > 28.8*  29.1*   < > 29.9* 28.5* 27.8*  MCV 90.2  --  92.6  --   --   --  92.7  PLT 248  --  175  --   --   --  174   < > = values in this interval not displayed.   BMET Recent Labs    01/05/20 0810 01/06/20 0140 01/07/20 0149  NA 130* 130* 130*  K 4.1 3.7 3.5  CL 99 103 100  CO2 20* 25 24  GLUCOSE 204* 120* 147*  BUN 16 13 9   CREATININE 1.59*  1.38* 1.31*  CALCIUM 9.2 8.5* 8.9   LFTs Recent Labs    01/05/20 0810 01/06/20 0140  BILITOT 1.1 0.8  ALKPHOS 42 30*  AST 31 24  ALT 47* 38  PROT 6.7 5.7*  ALBUMIN 3.6 3.0*   No results for input(s): LIPASE in the last 72 hours. PT/INR Recent Labs    01/05/20 0810  LABPROT 13.9  INR 1.1      Imaging Studies: CT ABDOMEN PELVIS W CONTRAST  Result Date: 01/05/2020 CLINICAL DATA:  Melena. Copious amount of red blood and clots coming from rectum. Abdomen hurting. EXAM: CT ABDOMEN AND PELVIS WITH CONTRAST TECHNIQUE: Multidetector CT imaging of the abdomen and pelvis was performed using the standard protocol following bolus administration of intravenous contrast. CONTRAST:  106mL OMNIPAQUE IOHEXOL 300 MG/ML  SOLN COMPARISON:  11/07/2019 FINDINGS: Lower chest: Small pleural effusions identified. Calcifications within the left circumflex and RCA coronary arteries. Aortic atherosclerosis. Hepatobiliary: No focal liver abnormality is seen. Tiny stone within the dependent portion of gallbladder measuring 4 mm. No gallbladder inflammation. No biliary ductal dilatation. Pancreas: Unremarkable. No pancreatic ductal dilatation or surrounding inflammatory changes. Spleen: Normal in size without focal abnormality. Adrenals/Urinary Tract: The right there is asymmetric left adrenal hypertrophy. Small  right adrenal gland nodule measures 1.1 cm. This is unchanged from 2018. Likely representing a benign abnormality. Multiple right kidney cysts are identified. The largest arises from the upper pole measuring 5.3 cm. These wall are somewhat obscured by motion artifact. Several small left kidney cysts are also noted. No hydronephrosis identified bilaterally. Urinary bladder negative. Stomach/Bowel: Stomach in the small bowel loops are unremarkable. Diffuse colonic diverticulosis identified. There is a focal area of asymmetric inflammation involving the mid ascending colon likely reflecting acute diverticulitis. No  significant free fluid or fluid collection. No abnormal bowel distension. Vascular/Lymphatic: Aortic atherosclerosis. There is no aneurysm. No abdominopelvic adenopathy identified. Reproductive: Mild prostate gland enlargement. Other: No free fluid or fluid collections. Periumbilical hernia contains fat only. There is also a small fat containing supraumbilical hernia. Musculoskeletal: L5-S1 degenerative disc disease. IMPRESSION: 1. Examination is positive for acute diverticulitis involving the mid ascending colon. No perforation or abscess identified. 2. Small bilateral pleural effusions. 3. Aortic atherosclerosis and multi vessel coronary artery calcification. 4. Gallstone. Aortic Atherosclerosis (ICD10-I70.0). Electronically Signed   By: Kerby Moors M.D.   On: 01/05/2020 09:18  [2 weeks]   Assessment: Pleasant 69 year old male presenting with rectal bleeding, possible abdominal pain, hypotension now improved, CT findings of acute uncomplicated diverticulitis involving the mid ascending colon.  Started on Zosyn for diverticulitis. Abdominal pain improved with minimal tenderness on exam.   Rectal bleeding: 3 liquidy stools overnight, large amount, described as brown. Spoke with nursing, before 7pm he had maroon colored stool. no melena. Patient reports bleeding seems to have tapered and he wants to go home. Reassuring his hemoglobin has been stable over the last 48 hours.  Rectal bleeding unclear. Not typical of diverticulitis therefore could be from a separate entity. He takes BC powders daily and ASA 81mg  daily but no findings supportive of UGI bleed at this time. H/O H.pylori in 2017, initially treated with Prevpac, but H.pylori breath test positive so retreated with Pylera. Eradication has not been documented at this point.   Plan: 1. Complete full 10 day course of antibiotics.  2. Continue PPI at discharge.  3. Avoid NSAIDS/ASA powders.  4. Outpatient colonoscopy in 6-8 weeks after resolution  of diverticulitis.  5. Consider recheck for H.pylori eradication once off antibiotic therapy and can hold PPI for two weeks. Will follow up on as outpatient. 6. Monitor for further bleeding today, may be able to discharge in the next 12-24 hours. At earliest later today if remains stable.   Laureen Ochs. [facility] [phone] 1/20/20218:55 AM     LOS: 2 days

## 2020-01-08 ENCOUNTER — Encounter: Payer: Self-pay | Admitting: Gastroenterology

## 2020-01-08 ENCOUNTER — Encounter: Payer: Medicare Other | Admitting: Family Medicine

## 2020-01-08 ENCOUNTER — Telehealth: Payer: Self-pay | Admitting: Gastroenterology

## 2020-01-08 ENCOUNTER — Telehealth: Payer: Self-pay | Admitting: *Deleted

## 2020-01-08 ENCOUNTER — Other Ambulatory Visit: Payer: Self-pay

## 2020-01-08 DIAGNOSIS — K922 Gastrointestinal hemorrhage, unspecified: Secondary | ICD-10-CM

## 2020-01-08 LAB — BASIC METABOLIC PANEL
Anion gap: 9 (ref 5–15)
BUN: 9 mg/dL (ref 8–23)
CO2: 27 mmol/L (ref 22–32)
Calcium: 9.4 mg/dL (ref 8.9–10.3)
Chloride: 99 mmol/L (ref 98–111)
Creatinine, Ser: 1.3 mg/dL — ABNORMAL HIGH (ref 0.61–1.24)
GFR calc Af Amer: >60 mL/min (ref >60)
GFR calc non Af Amer: 55 mL/min — ABNORMAL LOW (ref >60)
Glucose, Bld: 152 mg/dL — ABNORMAL HIGH (ref 70–99)
Potassium: 3.7 mmol/L (ref 3.5–5.1)
Sodium: 135 mmol/L (ref 135–145)

## 2020-01-08 LAB — CBC
HCT: 28.7 % — ABNORMAL LOW (ref 39.0–52.0)
Hemoglobin: 9 g/dL — ABNORMAL LOW (ref 13.0–17.0)
MCH: 29.1 pg (ref 26.0–34.0)
MCHC: 31.4 g/dL (ref 30.0–36.0)
MCV: 92.9 fL (ref 80.0–100.0)
Platelets: 211 10*3/uL (ref 150–400)
RBC: 3.09 MIL/uL — ABNORMAL LOW (ref 4.22–5.81)
RDW: 13.4 % (ref 11.5–15.5)
WBC: 4.7 10*3/uL (ref 4.0–10.5)
nRBC: 0 % (ref 0.0–0.2)

## 2020-01-08 LAB — GLUCOSE, CAPILLARY
Glucose-Capillary: 150 mg/dL — ABNORMAL HIGH (ref 70–99)
Glucose-Capillary: 272 mg/dL — ABNORMAL HIGH (ref 70–99)

## 2020-01-08 MED ORDER — ASPIRIN EC 81 MG PO TBEC: 81.0000 mg | DELAYED_RELEASE_TABLET | Freq: Every day | ORAL | Status: DC

## 2020-01-08 MED ORDER — AMOXICILLIN-POT CLAVULANATE 875-125 MG PO TABS: 1.0000 | ORAL_TABLET | Freq: Two times a day (BID) | ORAL | 0 refills | Status: AC

## 2020-01-08 MED ORDER — PANTOPRAZOLE SODIUM 40 MG PO TBEC: 40.0000 mg | DELAYED_RELEASE_TABLET | Freq: Every day | ORAL | 2 refills | Status: DC

## 2020-01-08 NOTE — Telephone Encounter (Signed)
TOC call needed was dc from [facility] 01-08-20 let him know that the call would not be today as he was dc today they may call tomorrow

## 2020-01-08 NOTE — Discharge Summary (Signed)
Physician Discharge Summary  KOEHN SALEHI XTK:[id number] DOB: Oct 30, 1951 DOA: 01/05/2020  PCP: Fayrene Helper, MD  Admit date: 01/05/2020 Discharge date: 01/08/2020  Time spent: 35 minutes  Recommendations for Outpatient Follow-up:  1. Repeat CBC to follow hemoglobin trend 2. Repeat basic metabolic panel to evaluate lites and renal function 3. Reassess blood pressure and further adjust antihypertensive regimen as needed.   Discharge Diagnoses:  Active Problems:   Tobacco abuse   CKD (chronic kidney disease) stage 3, GFR 30-59 ml/min (HCC)   Acute blood loss anemia   Ischemic colitis (HCC)   Lower GI bleed   Diverticulitis of colon   Chronic respiratory failure with hypoxia (HCC)   Rectal bleeding Type 2 diabetes with nephropathy  Discharge Condition: Stable and improved.  Patient discharged home with instruction to follow-up with PCP and gastroenterology service as scheduled.  CODE STATUS: Full code  Diet recommendation: Modified carbohydrate diet, heart healthy diet.  Filed Weights   01/05/20 0745  Weight: 108.9 kg    History of present illness:  69 y.o.malewith medical history ofCOPD, diabetes mellitus type 2, depression, hyperlipidemia, hypertension, tobacco abuse, CKD stage III presenting with rectal bleeding that began around 6 AM on the morning of 01/05/2020. The patient stated that he had some right-sided abdominal pain associated with this. He states that he passed a large volume amount of blood with clots. The patient had been in his usual state of health prior to this episode. He denied any fevers, chills, chest pain, shortness breath, coughing, nausea, vomiting, diarrhea. Upon EMS arrival, the patient was noted to be hypotensive with a blood pressure of 70/40 and tachycardia with heart rate of 130s. Patient states that he had an episode of small-volume hematochezia approximately 1 year prior to this admission but denies any medical care at that time. He  denies any hemoptysis, hematemesis, melena, dysuria, hematuria. He denies any NSAIDs or alcohol use. In the emergency department, the patient was afebrile with soft blood pressures in the low 90s. Oxygen saturation was 97-90% on room air. Sodium was 130, potassium 4.1, serum creatinine 1.59. LFTs showed AST 31, ALT 47, alk phosphatase 42, total bilirubin 1.1. WBC 12.5, hemoglobin 12.2, platelets 240,000. Lactic acid 1.8. CT of the abdomen and pelvis showed diffuse diverticulosis with asymmetric area of focal inflammatory change in the ascending colon.  Hospital Course:  1-acute blood loss anemia in the setting of diverticulitis/ischemic colitis -Hemoglobin has stabilized and patient no actively bleeding at time of discharge. -Hemoglobin 9.0 -We will recommend repeat CBC at follow-up visit to reassess hemoglobin trend. -Patient asymptomatic, denying lightheadedness, nausea, vomiting, chest pain, shortness of breath, abdominal pain or any other complaints. -Tolerating diet without problems. -Following GI recommendations we will treat for another 7 days using Augmentin twice a day and patient will continue PPI on daily basis. -Outpatient follow-up with gastroenterology service for colonoscopy evaluation in approximately 6 weeks. -Patient advised to stop the use of NSAIDs.  2-tobacco abuse -Cessation counseling has been provided -Patient was receptive and looking to quit -Nicotine patch has been declined currently.  3-chronic kidney disease a stage IIIa -creatinine 1.3; normally 1.2-1.4 at baseline -Stable at time of discharge -Advised to maintain adequate hydration. -Repeat basic metabolic panel follow-up visit to reassess creatinine stability.  4-chronic respiratory failure with hypoxia -Patient reports using 3 L oxygen supplementation at nighttime only -Good O2 sat appreciated throughout the day without oxygen supplementation. -Patient denies shortness of breath and there is no  wheezing on exam.  5-history of  COPD -No wheezing -No complaints of shortness of breath currently. -Continue as needed bronchodilators and continue the use of Symbicort.  6-essential hypertension -Safe to resume the use of olmesartan and Maxide -Patient advised to follow heart healthy diet. -Reassess blood pressure at follow-up visit and further adjust antihypertensive regimen as required.  7-type 2 diabetes with nephropathy -Continue the use of glipizide -Modified carbohydrate diet has been encouraged.  8-depression/anxiety -Resume home mood stabilizer regimen -No suicidal ideation or hallucinations.  Procedures:  See below for x-ray reports.  Consultations:  Gastroenterology service  Discharge Exam: Vitals:   01/08/20 0450 01/08/20 0755  BP: (!) 125/91   Pulse: 82   Resp: 18   Temp: 97.6 F (36.4 C)   SpO2: 97% 96%    General: Alert, awake and oriented x3; no nausea, no vomiting, no abdominal pain.  Patient has tolerated diet without problems and reports no further bloody stools.  Ready to go home. Cardiovascular: S1 and S2, no rubs, no gallops, no JVD. Respiratory: Clear to auscultation bilaterally; normal respiratory effort.  Good oxygen saturation on room air. Abdomen: Soft, nontender, nondistended, positive bowel sounds Extremities: No cyanosis, no clubbing, no edema.  Discharge Instructions   Discharge Instructions    Diet - low sodium heart healthy   Complete by: As directed    Discharge instructions   Complete by: As directed    Take medications as prescribed Maintain adequate hydration Continue to avoid the use of any NSAIDs (aspirin, Excedrin, ibuprofen, Motrin, Goody powders, Aleve, Advil, or any other over-the-counter pain relief medications). Follow-up with gastroenterology service (office will contact you with appointment details). Increase amount of fiber in your diet.   Increase activity slowly   Complete by: As directed      Allergies as  of 01/08/2020      Reactions   Sertraline Hcl    Stomach upset/pain   Wellbutrin [bupropion] Other (See Comments)   Makes stomach hurt      Medication List    STOP taking these medications   tiZANidine 2 MG tablet Commonly known as: ZANAFLEX     TAKE these medications   Accu-Chek Aviva Plus test strip Generic drug: glucose blood USE TO TEST ONCE DAILY   Accu-Chek Aviva Plus w/Device Kit accucheck aviva meter Once daily testing DX e11.9   Accu-Chek Softclix Lancets lancets TEST BLOOD SUGAR ONCE DAILY AS DIRECTED.   acetaminophen 500 MG tablet Commonly known as: TYLENOL Take 500-1,000 mg by mouth daily as needed. Takes mostly for HA about 2 x/week.   albuterol 108 (90 Base) MCG/ACT inhaler Commonly known as: VENTOLIN HFA INHALE 2 PUFFS INTO THE LUNGS EVERY 6 HOURS AS NEEDED FOR WHEEZING OR SHORTNESS OF BREATH.   amoxicillin-clavulanate 875-125 MG tablet Commonly known as: Augmentin Take 1 tablet by mouth 2 (two) times daily for 7 days.   aspirin EC 81 MG tablet Take 1 tablet (81 mg total) by mouth daily. Hold until follow up with GI What changed: additional instructions   budesonide-formoterol 80-4.5 MCG/ACT inhaler Commonly known as: Symbicort INHALE 2 PUFFS INTO LUNGS TWICE DAILY.   busPIRone 7.5 MG tablet Commonly known as: BUSPAR TAKE 1 TABLET BY MOUTH 3 TIMES DAILY.   glipiZIDE XL 5 MG 24 hr tablet Generic drug: glipiZIDE TAKE 1 TABLET DAILY WITH BREAKFAST.   ipratropium-albuterol 0.5-2.5 (3) MG/3ML Soln Commonly known as: DUONEB USE 1 VIAL BY NEBULIZATION EVERY 4 TO 6 HOURS   olmesartan 40 MG tablet Commonly known as: BENICAR TAKE 1 TABLET BY MOUTH  ONCE A DAY.   pantoprazole 40 MG tablet Commonly known as: PROTONIX Take 1 tablet (40 mg total) by mouth daily. Start taking on: January 09, 2020   pravastatin 20 MG tablet Commonly known as: PRAVACHOL TAKE (1) TABLET BY MOUTH AT BEDTIME FOR CHOLESTEROL.   risperidone 4 MG tablet Commonly known as:  RISPERDAL Takes with a 1 mg tablet at night to = 5 mg dose   risperiDONE 1 MG tablet Commonly known as: RISPERDAL TAKE (1) TABLET BY MOUTH AT BEDTIME WITH '4MG'$ .   traZODone 50 MG tablet Commonly known as: DESYREL Take 1 tablet (50 mg total) by mouth at bedtime as needed. for sleep   triamterene-hydrochlorothiazide 37.5-25 MG tablet Commonly known as: MAXZIDE-25 Take 0.5 tablets by mouth daily.   UNABLE TO FIND Nebulizer mouthpiece and tubing x 1  DX COPD   UNABLE TO FIND XL pullups for daily use DX Incontinence   UNABLE TO FIND Nebulizer tubing x 1  DX J44.9      Allergies  Allergen Reactions  . Sertraline Hcl     Stomach upset/pain  . Wellbutrin [Bupropion] Other (See Comments)    Makes stomach hurt   Follow-up Information    Fayrene Helper, MD. Schedule an appointment as soon as possible for a visit in 10 day(s).   Specialty: Family Medicine Contact information: [address] [place] [address] [phone]           The results of significant diagnostics from this hospitalization (including imaging, microbiology, ancillary and laboratory) are listed below for reference.    Significant Diagnostic Studies: CT ABDOMEN PELVIS W CONTRAST  Result Date: 01/05/2020 CLINICAL DATA:  Melena. Copious amount of red blood and clots coming from rectum. Abdomen hurting. EXAM: CT ABDOMEN AND PELVIS WITH CONTRAST TECHNIQUE: Multidetector CT imaging of the abdomen and pelvis was performed using the standard protocol following bolus administration of intravenous contrast. CONTRAST:  51m OMNIPAQUE IOHEXOL 300 MG/ML  SOLN COMPARISON:  11/07/2019 FINDINGS: Lower chest: Small pleural effusions identified. Calcifications within the left circumflex and RCA coronary arteries. Aortic atherosclerosis. Hepatobiliary: No focal liver abnormality is seen. Tiny stone within the dependent portion of gallbladder measuring 4 mm. No gallbladder inflammation. No biliary ductal  dilatation. Pancreas: Unremarkable. No pancreatic ductal dilatation or surrounding inflammatory changes. Spleen: Normal in size without focal abnormality. Adrenals/Urinary Tract: The right there is asymmetric left adrenal hypertrophy. Small right adrenal gland nodule measures 1.1 cm. This is unchanged from 2018. Likely representing a benign abnormality. Multiple right kidney cysts are identified. The largest arises from the upper pole measuring 5.3 cm. These wall are somewhat obscured by motion artifact. Several small left kidney cysts are also noted. No hydronephrosis identified bilaterally. Urinary bladder negative. Stomach/Bowel: Stomach in the small bowel loops are unremarkable. Diffuse colonic diverticulosis identified. There is a focal area of asymmetric inflammation involving the mid ascending colon likely reflecting acute diverticulitis. No significant free fluid or fluid collection. No abnormal bowel distension. Vascular/Lymphatic: Aortic atherosclerosis. There is no aneurysm. No abdominopelvic adenopathy identified. Reproductive: Mild prostate gland enlargement. Other: No free fluid or fluid collections. Periumbilical hernia contains fat only. There is also a small fat containing supraumbilical hernia. Musculoskeletal: L5-S1 degenerative disc disease. IMPRESSION: 1. Examination is positive for acute diverticulitis involving the mid ascending colon. No perforation or abscess identified. 2. Small bilateral pleural effusions. 3. Aortic atherosclerosis and multi vessel coronary artery calcification. 4. Gallstone. Aortic Atherosclerosis (ICD10-I70.0). Electronically Signed   By: TQueen SloughD.  On: 01/05/2020 09:18   Microbiology: Recent Results (from the past 240 hour(s))  Respiratory Panel by RT PCR (Flu A&B, Covid) - Nasopharyngeal Swab     Status: None   Collection Time: 01/05/20  8:00 AM   Specimen: Nasopharyngeal Swab  Result Value Ref Range Status   SARS Coronavirus 2 by RT PCR NEGATIVE  NEGATIVE Final    Comment: (NOTE) SARS-CoV-2 target nucleic acids are NOT DETECTED. The SARS-CoV-2 RNA is generally detectable in upper respiratoy specimens during the acute phase of infection. The lowest concentration of SARS-CoV-2 viral copies this assay can detect is 131 copies/mL. A negative result does not preclude SARS-Cov-2 infection and should not be used as the sole basis for treatment or other patient management decisions. A negative result may occur with  improper specimen collection/handling, submission of specimen other than nasopharyngeal swab, presence of viral mutation(s) within the areas targeted by this assay, and inadequate number of viral copies (<131 copies/mL). A negative result must be combined with clinical observations, patient history, and epidemiological information. The expected result is Negative. Fact Sheet for Patients:  PinkCheek.be Fact Sheet for Healthcare Providers:  GravelBags.it This test is not yet ap proved or cleared by the [place] FDA and  has been authorized for detection and/or diagnosis of SARS-CoV-2 by FDA under an Emergency Use Authorization (EUA). This EUA will remain  in effect (meaning this test can be used) for the duration of the COVID-19 declaration under Section 564(b)(1) of the Act, 21 U.S.C. section 360bbb-3(b)(1), unless the authorization is terminated or revoked sooner.    Influenza A by PCR NEGATIVE NEGATIVE Final   Influenza B by PCR NEGATIVE NEGATIVE Final    Comment: (NOTE) The Xpert Xpress SARS-CoV-2/FLU/RSV assay is intended as an aid in  the diagnosis of influenza from Nasopharyngeal swab specimens and  should not be used as a sole basis for treatment. Nasal washings and  aspirates are unacceptable for Xpert Xpress SARS-CoV-2/FLU/RSV  testing. Fact Sheet for Patients: PinkCheek.be Fact Sheet for Healthcare  Providers: GravelBags.it This test is not yet approved or cleared by the [place] FDA and  has been authorized for detection and/or diagnosis of SARS-CoV-2 by  FDA under an Emergency Use Authorization (EUA). This EUA will remain  in effect (meaning this test can be used) for the duration of the  Covid-19 declaration under Section 564(b)(1) of the Act, 21  U.S.C. section 360bbb-3(b)(1), unless the authorization is  terminated or revoked. Performed at [facility], [address], [place], [place] [address]   MRSA PCR Screening     Status: None   Collection Time: 01/05/20  4:08 PM   Specimen: Nasal Mucosa; Nasopharyngeal  Result Value Ref Range Status   MRSA by PCR NEGATIVE NEGATIVE Final    Comment:        The GeneXpert MRSA Assay (FDA approved for NASAL specimens only), is one component of a comprehensive MRSA colonization surveillance program. It is not intended to diagnose MRSA infection nor to guide or monitor treatment for MRSA infections. Performed at [facility], [address] [place], [place] [address]      Labs: Basic Metabolic Panel: Recent Labs  Lab 01/05/20 0801 01/05/20 0810 01/06/20 0140 01/07/20 0149 01/08/20 0548  NA 130* 130* 130* 130* 135  K 4.0 4.1 3.7 3.5 3.7  CL 99 99 103 100 99  CO2  --  20* '25 24 27  '$ GLUCOSE 201* 204* 120* 147* 152*  BUN '15 16 13 9 9  '$ CREATININE 1.70*  1.59* 1.38* 1.31* 1.30*  CALCIUM  --  9.2 8.5* 8.9 9.4   Liver Function Tests: Recent Labs  Lab 01/05/20 0810 01/06/20 0140  AST 31 24  ALT 47* 38  ALKPHOS 42 30*  BILITOT 1.1 0.8  PROT 6.7 5.7*  ALBUMIN 3.6 3.0*   CBC: Recent Labs  Lab 01/05/20 0810 01/05/20 1505 01/06/20 0140 01/06/20 0844 01/06/20 1431 01/06/20 2040 01/07/20 0149 01/07/20 0836 01/08/20 0548  WBC 12.5*  --  6.6  --   --   --  5.3  --  4.7  NEUTROABS 10.4*  --   --   --   --   --   --   --   --   HGB 12.2*   < > 9.3*  9.3*   < > 9.5* 9.1* 9.0* 9.2* 9.0*   HCT 36.7*   < > 28.8*  29.1*   < > 29.9* 28.5* 27.8* 28.6* 28.7*  MCV 90.2  --  92.6  --   --   --  92.7  --  92.9  PLT 248  --  175  --   --   --  174  --  211   < > = values in this interval not displayed.    CBG: Recent Labs  Lab 01/07/20 1106 01/07/20 1619 01/07/20 2136 01/08/20 0714 01/08/20 1102  GLUCAP 170* 111* 176* 150* 272*    Signed:  Barton Dubois MD.  [facility] 01/08/2020, 11:33 AM

## 2020-01-08 NOTE — Telephone Encounter (Signed)
Patient scheduled.

## 2020-01-08 NOTE — Telephone Encounter (Signed)
TCS date held for next available 4/15 at 2:15pm. Fowarding to SS to schedule OV

## 2020-01-08 NOTE — Telephone Encounter (Signed)
Please arrange for outpatient hospital follow up in 4 weeks. Patient needs colonoscopy in 6-8 weeks. Can we save a date for SLF, conscious sedation ok.

## 2020-01-08 NOTE — Plan of Care (Signed)

## 2020-01-08 NOTE — Telephone Encounter (Signed)
Unable to call patient day of discharge. Will call tomorrow

## 2020-01-09 ENCOUNTER — Encounter: Payer: Self-pay | Admitting: Family Medicine

## 2020-01-09 ENCOUNTER — Ambulatory Visit (INDEPENDENT_AMBULATORY_CARE_PROVIDER_SITE_OTHER): Payer: Medicare Other | Admitting: Family Medicine

## 2020-01-09 ENCOUNTER — Telehealth: Payer: Self-pay

## 2020-01-09 VITALS — BP 115/79 | HR 79 | Resp 15 | Ht 73.0 in | Wt 240.0 lb

## 2020-01-09 DIAGNOSIS — E1169 Type 2 diabetes mellitus with other specified complication: Secondary | ICD-10-CM | POA: Diagnosis not present

## 2020-01-09 DIAGNOSIS — R519 Headache, unspecified: Secondary | ICD-10-CM | POA: Diagnosis not present

## 2020-01-09 DIAGNOSIS — E669 Obesity, unspecified: Secondary | ICD-10-CM | POA: Diagnosis not present

## 2020-01-09 NOTE — Progress Notes (Signed)
Virtual Visit via Telephone Note   This visit type was conducted due to national recommendations for restrictions regarding the COVID-19 Pandemic (e.g. social distancing) in an effort to limit this patient's exposure and mitigate transmission in our community.  Due to his co-morbid illnesses, this patient is at least at moderate risk for complications without adequate follow up.  This format is felt to be most appropriate for this patient at this time.  The patient did not have access to video technology/had technical difficulties with video requiring transitioning to audio format only (telephone).  All issues noted in this document were discussed and addressed.  No physical exam could be performed with this format.  Evaluation Performed:  Follow-up visit  Date:  01/09/2020   ID:  Matthew Molina, DOB 1951-04-29, MRN [id number]  Patient Location: Home Provider Location: Office  Location of Patient: Home Location of Provider: Telehealth Consent was obtain for visit to be over via telehealth. I verified that I am speaking with the correct person using two identifiers.  PCP:  Fayrene Helper, MD   Chief Complaint:  Headache from Augmetin  History of Present Illness:    Matthew Molina is a 69 y.o. male with history of COPD, arthritis, diabetes without complication, hypertension, hyperlipidemia, obesity, nicotine addiction, schizophrenia among others.  Calls in this morning stating that the Augmentin that the hospital started him on for his diverticulitis is giving him a headache.  He is only taken 1 dose so he thinks is from that but is not 100% sure he has had amoxicillin in the past per records no note of having an allergy to this or having that stopped from headaches.  Reports that his blood pressure first thing this morning was 174 systolically taking on the phone needed to come down to 944 systolically.  Took his blood sugar for me while on the phone and it was 199.  He did have acute  blood loss but had a stable hemoglobin at 9 at discharge.  TOC is on January 27.  He denies having any nausea, vomiting, vision changes, dizziness, chest pain associated.  The patient does not have symptoms concerning for COVID-19 infection (fever, chills, cough, or new shortness of breath).   Past Medical, Surgical, Social History, Allergies, and Medications have been Reviewed.  Past Medical History:  Diagnosis Date   Acute kidney injury ([place])    Acute respiratory failure with hypoxia (HCC)    Arthritis    COPD (chronic obstructive pulmonary disease) ([place])    Depression    Diabetes mellitus    Diabetes mellitus without complication ([place])    Hypercholesterolemia    Hyperlipidemia    Hypertension    Multiple lung nodules on CT 04/02/2015   Nicotine addiction    Obesity    Oxygen deficiency    qhs   Schizophrenia [facility])    Past Surgical History:  Procedure Laterality Date   CATARACT EXTRACTION W/PHACO Left 11/20/2013   Procedure: CATARACT EXTRACTION PHACO AND INTRAOCULAR LENS PLACEMENT ([place]);  Surgeon: Tonny Branch, MD;  Location: [facility];  Service: Ophthalmology;  Laterality: Left;  CDE:10.26   CATARACT EXTRACTION W/PHACO Right 12/08/2013   Procedure: RIGHT EYE CATARACT EXTRACTION PHACO AND INTRAOCULAR LENS PLACEMENT ;  Surgeon: Tonny Branch, MD;  Location: [facility];  Service: Ophthalmology;  Laterality: Right;  CDE 12.38   COLONOSCOPY N/A 04/01/2013   pancolonic diverticulosis, redundant colon, large internal hemorrhoids.    COLONOSCOPY  2014   INCOMPLETE PREP IN R  COLON   ESOPHAGOGASTRODUODENOSCOPY N/A 02/18/2016   stricture at GE junction, moderate erosive gastritis and mild non-erosive duodenitis. +H.pylori. Treated initially with Prevpac. Breath test to check for eradication was positive, and he was prescribed Pylera.    EYE SURGERY Left 11/2013   cataract extraction   SAVORY DILATION N/A 02/18/2016   Procedure: SAVORY DILATION;  Surgeon: Danie Binder, MD;   Location: [facility] SUITE;  Service: Endoscopy;  Laterality: N/A;   SHOULDER SURGERY       Current Meds  Medication Sig   Accu-Chek Softclix Lancets lancets TEST BLOOD SUGAR ONCE DAILY AS DIRECTED.   acetaminophen (TYLENOL) 500 MG tablet Take 500-1,000 mg by mouth daily as needed. Takes mostly for HA about 2 x/week.   albuterol (VENTOLIN HFA) 108 (90 Base) MCG/ACT inhaler INHALE 2 PUFFS INTO THE LUNGS EVERY 6 HOURS AS NEEDED FOR WHEEZING OR SHORTNESS OF BREATH.   amoxicillin-clavulanate (AUGMENTIN) 875-125 MG tablet Take 1 tablet by mouth 2 (two) times daily for 7 days.   aspirin EC 81 MG tablet Take 1 tablet (81 mg total) by mouth daily. Hold until follow up with GI   Blood Glucose Monitoring Suppl (ACCU-CHEK AVIVA PLUS) w/Device KIT accucheck aviva meter Once daily testing DX e11.9   budesonide-formoterol (SYMBICORT) 80-4.5 MCG/ACT inhaler INHALE 2 PUFFS INTO LUNGS TWICE DAILY.   busPIRone (BUSPAR) 7.5 MG tablet TAKE 1 TABLET BY MOUTH 3 TIMES DAILY.   GLIPIZIDE XL 5 MG 24 hr tablet TAKE 1 TABLET DAILY WITH BREAKFAST.   glucose blood (ACCU-CHEK AVIVA PLUS) test strip USE TO TEST ONCE DAILY   ipratropium-albuterol (DUONEB) 0.5-2.5 (3) MG/3ML SOLN USE 1 VIAL BY NEBULIZATION EVERY 4 TO 6 HOURS   olmesartan (BENICAR) 40 MG tablet TAKE 1 TABLET BY MOUTH ONCE A DAY.   pantoprazole (PROTONIX) 40 MG tablet Take 1 tablet (40 mg total) by mouth daily.   pravastatin (PRAVACHOL) 20 MG tablet TAKE (1) TABLET BY MOUTH AT BEDTIME FOR CHOLESTEROL.   risperiDONE (RISPERDAL) 1 MG tablet TAKE (1) TABLET BY MOUTH AT BEDTIME WITH 4MG.   risperidone (RISPERDAL) 4 MG tablet Takes with a 1 mg tablet at night to = 5 mg dose   traZODone (DESYREL) 50 MG tablet Take 1 tablet (50 mg total) by mouth at bedtime as needed. for sleep   triamterene-hydrochlorothiazide (MAXZIDE-25) 37.5-25 MG tablet Take 0.5 tablets by mouth daily.   UNABLE TO FIND Nebulizer mouthpiece and tubing x 1  DX COPD   UNABLE TO  FIND XL pullups for daily use DX Incontinence   UNABLE TO FIND Nebulizer tubing x 1  DX J44.9     Allergies:   Sertraline hcl and Wellbutrin [bupropion]   ROS:   Please see the history of present illness.    All other systems reviewed and are negative.   Labs/Other Tests and Data Reviewed:    Recent Labs: 01/06/2020: ALT 38 01/08/2020: BUN 9; Creatinine, Ser 1.30; Hemoglobin 9.0; Platelets 211; Potassium 3.7; Sodium 135   Recent Lipid Panel Lab Results  Component Value Date/Time   CHOL 158 07/09/2019 07:32 AM   TRIG 161 (H) 07/09/2019 07:32 AM   HDL 36 (L) 07/09/2019 07:32 AM   CHOLHDL 4.4 07/09/2019 07:32 AM   LDLCALC 96 07/09/2019 07:32 AM    Wt Readings from Last 3 Encounters:  01/09/20 240 lb (108.9 kg)  01/05/20 240 lb (108.9 kg)  10/08/19 241 lb (109.3 kg)     Objective:    Vital Signs:  BP 115/79  Pulse 79    Resp 15    Ht _0  (1.854 m)    Wt 240 lb (108.9 kg)    BMI 31.66 kg/m    VITAL SIGNS:  reviewed GEN:  Alert and oriented RESPIRATORY:  No shortness of breath noted in conversation PSYCH:  Normal affect, mood, good communication  ASSESSMENT & PLAN:    1. Acute nonintractable headache, unspecified headache type  2. Diabetes mellitus type 2 in obese [facility])  Please see problem list for assessment and plan.   Time:   Today, I have spent 10 minutes with the patient with telehealth technology discussing the above problems.     Medication Adjustments/Labs and Tests Ordered: Current medicines are reviewed at length with the patient today.  Concerns regarding medicines are outlined above.   Tests Ordered: No orders of the defined types were placed in this encounter.   Medication Changes: No orders of the defined types were placed in this encounter.   Disposition:  Follow up 01/14/2020 Signed, Perlie Mayo, NP  01/09/2020 8:35 AM     [facility]

## 2020-01-09 NOTE — Telephone Encounter (Signed)
TOC call completed. Appt has been scheduled.

## 2020-01-09 NOTE — Assessment & Plan Note (Signed)
Elevated blood sugar today being checked with fine fasting 199.  Reports that this is not unusual for him but is usually not that high.  Possible had better control while he was inpatient now having higher levels could result having a headache.  Advised for him to hydrate and eat a well-balanced diet and notify us if he is not feeling any better. Patient acknowledged agreement and understanding of the plan.

## 2020-01-09 NOTE — Assessment & Plan Note (Signed)
Calls in today reporting that he has a headache after 1 dose of Augmentin.  Is recently out of the hospital has TOC next week.  Reports that he thinks the Augmentin is was causing it.  But he does have an elevation in his blood sugar.  Blood pressure remained stable.  Had better control over his blood sugar probably while inpatient in addition to making sure that he was having good hydration and oxygenation as needed.  Multiple reasons could be causing the headache advised to continue the Augmentin unless he is unable to tolerate it becomes nauseated develops chest pain or any other signs and symptoms that would promote more of an allergy.  Educated that he would have to make 2 other medicines in place of this is 1 been sent he is okay trying to continue the medicine to see if that would work.  He has been advised to either return to the hospital if he feels worse will call us using the on-call number.

## 2020-01-09 NOTE — Telephone Encounter (Signed)
Transition Care Management Follow-up Telephone Call   Date discharged?  01/08/2020             How have you been since you were released from the hospital? has a bad headache from antibiotic   Do you understand why you were in the hospital? diverticulitis   Do you understand the discharge instructions? yes   Where were you discharged to? home   Items Reviewed:  Medications reviewed: yes  Allergies reviewed: yes  Dietary changes reviewed: yes  Referrals reviewed: yes GI [facility]   Functional Questionnaire:   Activities of Daily Living (ADLs):  yes    Any transportation issues/concerns?: no   Any patient concerns?    Confirmed importance and date/time of follow-up visits scheduled 01/14/2020 with NP     Confirmed with patient if condition begins to worsen call PCP or go to the ER.  Patient was given the office number and encouraged to call back with question or concerns.  :  Yes with verbal understanding

## 2020-01-09 NOTE — Patient Instructions (Signed)
Happy New Year! May you have a year filled with hope, love, happiness and laughter.  I appreciate the opportunity to provide you with care for your health and wellness. Today we discussed: Headache after 1 dose of Augmentin.  Follow up: As scheduled for next week TOC  No labs or referrals today  He has been advised to call the office back on line number and/or go to the emergency room if he develops nausea vomiting, chest pain discomfort.  He is encouraged to make sure that he monitors his blood sugar and blood pressure to help keep them in good range.  And to hydrate well.  Please continue to practice social distancing to keep you, your family, and our community safe.  If you must go out, please wear a mask and practice good handwashing.  It was a pleasure to see you and I look forward to continuing to work together on your health and well-being. Please do not hesitate to call the office if you need care or have questions about your care.  Have a wonderful day and week. With Gratitude, Cherly Beach, DNP, AGNP-BC

## 2020-01-12 ENCOUNTER — Other Ambulatory Visit: Payer: Self-pay | Admitting: *Deleted

## 2020-01-12 NOTE — Patient Outreach (Signed)
Red EMMI flag received for 01/10/20 Day # 1 general discharge.  Taking medications- No.  Outreach call to pt to address, spoke with pt, HIPAA verified, pt reports " I do have my medicine and I am taking it"  No new concerns voiced.  Jacqlyn Larsen [facility], [place] Coordinator [phone]

## 2020-01-14 ENCOUNTER — Encounter: Payer: Self-pay | Admitting: Family Medicine

## 2020-01-14 ENCOUNTER — Ambulatory Visit (INDEPENDENT_AMBULATORY_CARE_PROVIDER_SITE_OTHER): Payer: Medicare Other | Admitting: Family Medicine

## 2020-01-14 ENCOUNTER — Other Ambulatory Visit: Payer: Self-pay

## 2020-01-14 ENCOUNTER — Other Ambulatory Visit: Payer: Self-pay | Admitting: *Deleted

## 2020-01-14 VITALS — BP 124/70 | HR 104 | Temp 98.2°F | Resp 15 | Ht 73.0 in | Wt 244.0 lb

## 2020-01-14 DIAGNOSIS — E1169 Type 2 diabetes mellitus with other specified complication: Secondary | ICD-10-CM | POA: Diagnosis not present

## 2020-01-14 DIAGNOSIS — K5732 Diverticulitis of large intestine without perforation or abscess without bleeding: Secondary | ICD-10-CM | POA: Diagnosis not present

## 2020-01-14 DIAGNOSIS — R000 Tachycardia, unspecified: Secondary | ICD-10-CM

## 2020-01-14 DIAGNOSIS — R9431 Abnormal electrocardiogram [ECG] [EKG]: Secondary | ICD-10-CM

## 2020-01-14 DIAGNOSIS — Z720 Tobacco use: Secondary | ICD-10-CM

## 2020-01-14 DIAGNOSIS — J42 Unspecified chronic bronchitis: Secondary | ICD-10-CM | POA: Diagnosis not present

## 2020-01-14 DIAGNOSIS — Z7689 Persons encountering health services in other specified circumstances: Secondary | ICD-10-CM

## 2020-01-14 DIAGNOSIS — F209 Schizophrenia, unspecified: Secondary | ICD-10-CM

## 2020-01-14 DIAGNOSIS — D62 Acute posthemorrhagic anemia: Secondary | ICD-10-CM

## 2020-01-14 DIAGNOSIS — E669 Obesity, unspecified: Secondary | ICD-10-CM

## 2020-01-14 DIAGNOSIS — Z0001 Encounter for general adult medical examination with abnormal findings: Secondary | ICD-10-CM | POA: Insufficient documentation

## 2020-01-14 HISTORY — DX: Persons encountering health services in other specified circumstances: Z76.89

## 2020-01-14 LAB — CBC WITH DIFFERENTIAL/PLATELET
Absolute Monocytes: 877 cells/uL (ref 200–950)
Basophils Absolute: 52 cells/uL (ref 0–200)
Basophils Relative: 0.6 %
Eosinophils Absolute: 327 cells/uL (ref 15–500)
Eosinophils Relative: 3.8 %
HCT: 30.7 % — ABNORMAL LOW (ref 38.5–50.0)
Hemoglobin: 10.3 g/dL — ABNORMAL LOW (ref 13.2–17.1)
Lymphs Abs: 2468 cells/uL (ref 850–3900)
MCH: 29.5 pg (ref 27.0–33.0)
MCHC: 33.6 g/dL (ref 32.0–36.0)
MCV: 88 fL (ref 80.0–100.0)
MPV: 9.4 fL (ref 7.5–12.5)
Monocytes Relative: 10.2 %
Neutro Abs: 4876 cells/uL (ref 1500–7800)
Neutrophils Relative %: 56.7 %
Platelets: 409 10*3/uL — ABNORMAL HIGH (ref 140–400)
RBC: 3.49 10*6/uL — ABNORMAL LOW (ref 4.20–5.80)
RDW: 13.3 % (ref 11.0–15.0)
Total Lymphocyte: 28.7 %
WBC: 8.6 10*3/uL (ref 3.8–10.8)

## 2020-01-14 LAB — COMPLETE METABOLIC PANEL WITH GFR
AG Ratio: 1.6 (calc) (ref 1.0–2.5)
ALT: 57 U/L — ABNORMAL HIGH (ref 9–46)
AST: 30 U/L (ref 10–35)
Albumin: 4.3 g/dL (ref 3.6–5.1)
Alkaline phosphatase (APISO): 50 U/L (ref 35–144)
BUN/Creatinine Ratio: 7 (calc) (ref 6–22)
BUN: 9 mg/dL (ref 7–25)
CO2: 26 mmol/L (ref 20–32)
Calcium: 10.1 mg/dL (ref 8.6–10.3)
Chloride: 95 mmol/L — ABNORMAL LOW (ref 98–110)
Creat: 1.21 mg/dL — ABNORMAL HIGH (ref 0.70–1.18)
GFR, Est African American: 70 mL/min/{1.73_m2} (ref >=60)
GFR, Est Non African American: 60 mL/min/{1.73_m2} (ref >=60)
Globulin: 2.7 g/dL (calc) (ref 1.9–3.7)
Glucose, Bld: 112 mg/dL (ref 65–139)
Potassium: 3.9 mmol/L (ref 3.5–5.3)
Sodium: 130 mmol/L — ABNORMAL LOW (ref 135–146)
Total Bilirubin: 0.3 mg/dL (ref 0.2–1.2)
Total Protein: 7 g/dL (ref 6.1–8.1)

## 2020-01-14 LAB — MAGNESIUM: Magnesium: 2.1 mg/dL (ref 1.5–2.5)

## 2020-01-14 NOTE — Assessment & Plan Note (Signed)
Getting updated labs to make sure that hemoglobin is rebuilding.  And stable.  Will address if needed.

## 2020-01-14 NOTE — Patient Outreach (Signed)
Red EMMI flag general discharge received for 01/13/20 Day # 4, Lost interest in things? Yes.  Outreach call to pt to address issue, spoke with pt, HIPAA verified, pt reports this is not correct, states " I have down days every now and again but that's all"  Pt states the question was confusing and he does not think it recorded his answer correctly.  No new concerns voiced.  Jacqlyn Larsen [facility], [place] Coordinator [phone]

## 2020-01-14 NOTE — Assessment & Plan Note (Signed)
Improved, following up with GI in February.  No signs and symptoms of bleeding at this time.

## 2020-01-14 NOTE — Assessment & Plan Note (Signed)
Tachycardic today unsure if he is drinking adequately versus if this is something that is electrolyte base versus if he has had too much caffeine as his neighbor reports that he has been drinking a lot of Diet Coke.  Will be checking labs and address if need be.  Advised to continue to drink water but to avoid the caffeine follow-up closely 1 week.

## 2020-01-14 NOTE — Patient Instructions (Addendum)
Happy New Year! May you have a year filled with hope, love, happiness and laughter.  I appreciate the opportunity to provide you with care for your health and wellness. Today we discussed: recent hospital stay  Follow up: 1 week for pulse check    Labs today  -Avoid caffeine -Continue all medications as directed.  -Outpatient follow-up with gastroenterology service for colonoscopy evaluation in Feb. -Remain off NSAIDs medications: Advil, aspirin and the alike. -Work on not smoking as much so you can work to quit this year.  Please continue to practice social distancing to keep you, your family, and our community safe.  If you must go out, please wear a mask and practice good handwashing.  It was a pleasure to see you and I look forward to continuing to work together on your health and well-being. Please do not hesitate to call the office if you need care or have questions about your care.  Have a wonderful day and week. With Gratitude, Cherly Beach, DNP, AGNP-BC

## 2020-01-14 NOTE — Assessment & Plan Note (Signed)
The has elevation of fasting blood sugars will be needing to get updated labs.  Advised for him to continue eating well will be following up in 1 week.

## 2020-01-14 NOTE — Assessment & Plan Note (Signed)
As documented in the chart.  Will be getting updated labs.  Did note prolonged QT on EKG not noted in the hospital in the last several EKGs.  Getting stat labs at this time.  If needed will address them for any changes or corrections that might be needed.

## 2020-01-14 NOTE — Assessment & Plan Note (Signed)
Worsened due to nicotine.  Poor exercise compliance.  Reports that he is not having any shortness of breath or wheezing.  Continue current medications at this time strongly encouraged nicotine reduction.

## 2020-01-14 NOTE — Assessment & Plan Note (Signed)
Asked about quitting: confirms they are currently smokes cigarettes Advise to quit smoking: Educated about QUITTING to reduce the risk of cancer, cardio and cerebrovascular disease. Assess willingness: Unwilling to quit at this time, but is working on cutting back. Assist with counseling and pharmacotherapy: Counseled for 5 minutes and literature provided. Arrange for follow up:  not quitting follow up in 3 months and continue to offer help.   

## 2020-01-14 NOTE — Assessment & Plan Note (Signed)
  Matthew Molina is re-educated about the importance of exercise daily to help with weight management. A minumum of 30 minutes daily is recommended. Additionally, importance of healthy food choices  with portion control discussed.   Wt Readings from Last 3 Encounters:  01/14/20 244 lb 0.6 oz (110.7 kg)  01/09/20 240 lb (108.9 kg)  01/05/20 240 lb (108.9 kg)

## 2020-01-14 NOTE — Progress Notes (Signed)
Subjective:  Patient ID: Matthew Molina, male    DOB: 03-13-1951  Age: 69 y.o. MRN: [id number]  CC:  Chief Complaint  Patient presents with   Transitions Of Care      HPI  HPI  69 y.o.malewith medical history ofCOPD, diabetes mellitus type 2, depression, hyperlipidemia, hypertension, tobacco abuse, CKD stage III presented to the Ed on January 05, 2020 with rectal bleeding that began around 6 AM that morning.  He stated that he had some right-sided abdominal pain associated.  He stated that he passed a large volume of blood with clots. He reported he was doing fine prior to this episode. He denied any fevers, chills, chest pain, shortness breath, coughing, nausea, vomiting, diarrhea. Upon EMS arrival, the patient was noted to be hypotensive with a blood pressure of 70/40 and tachycardia with heart rate of 130s. Patient stated that he had an episode of small-volume hematochezia approximately 1 year prior to this admission but denies any medical care at that time. He denies any hemoptysis, hematemesis, melena, dysuria, hematuria. He denies any NSAIDs or alcohol use.  ED: In the emergency department, the patient was afebrile with soft blood pressures in the low 90s. Oxygen saturation was 97-90% on room air. Sodium was 130, potassium 4.1, serum creatinine 1.59. LFTs showed AST 31, ALT 47, alk phosphatase 42, total bilirubin 1.1. WBC 12.5, hemoglobin 12.2, platelets 240,000. Lactic acid 1.8. CT of the abdomen and pelvis showed diffuse diverticulosis with asymmetric area of focal inflammatory change in the ascending colon.  Hospital course: Diverticulitis/ischemic colitis causing acute blood loss hemoglobin was stabilized at 9 at discharge.  Will be getting follow-up CBC today.  He was discharged asymptomatic denying lightheadedness, nausea, vomiting,  shortness of breath, abdominal pain or any other complaints.  Today he denies all of these as well outside of a little bit of chest  pain/indigestion per him.  He is tolerating a diet without any problems at discharge.  He was discharged on 7 days of Augmentin twice a day and reports that he is tolerating it well had a headache first couple of days but is not having any issues now.  Also is on a PPI.  Will be following up with GI in February.  He was advised to stop all medications related to NSAID family.  He reports that he has done this and has not taken any aspirin.  He is currently still smoking.  Chronic kidney disease stage IIIa, creatinine is 1.3 within baseline at discharge.  Advised to maintain adequate hydration will be getting a follow-up at the med just to make sure everything is okay.  Reports that he has not had to use much of his oxygen doing really well without that.  Denies having any shortness of breath, wheezing and nothing appreciated on exam.  History of COPD but continues to use his bronchodilators and use of Symbicort.  He was restarted on his blood pressure medicines blood pressure is doing well today in the office.  Reports taking his medications without any issues.  Reports that his diabetes is not doing as good he thinks that he has had some elevated blood sugars but he is unsure if it is because is not eating right.    Reports that he is taking his mood stabilizer-respiratory as directed.  Denies having any suicidal ideations or hallucinations.  Today patient denies signs and symptoms of COVID 19 infection including fever, chills, cough, shortness of breath, and headache. Past Medical, Surgical, Social  History, Allergies, and Medications have been Reviewed.   Past Medical History:  Diagnosis Date   Acute kidney injury ([place])    Acute respiratory failure with hypoxia (HCC)    Arthritis    COPD (chronic obstructive pulmonary disease) ([place])    Depression    Diabetes mellitus    Diabetes mellitus without complication (HCC)    Elevated PSA 10/01/2016   Hypercholesterolemia     Hyperlipidemia    Hypertension    Insomnia 01/05/2016   Multiple lung nodules on CT 04/02/2015   Nicotine addiction    Obesity    Oxygen deficiency    qhs   Schizophrenia (HCC)     Current Meds  Medication Sig   Accu-Chek Softclix Lancets lancets TEST BLOOD SUGAR ONCE DAILY AS DIRECTED.   acetaminophen (TYLENOL) 500 MG tablet Take 500-1,000 mg by mouth daily as needed. Takes mostly for HA about 2 x/week.   albuterol (VENTOLIN HFA) 108 (90 Base) MCG/ACT inhaler INHALE 2 PUFFS INTO THE LUNGS EVERY 6 HOURS AS NEEDED FOR WHEEZING OR SHORTNESS OF BREATH.   amoxicillin-clavulanate (AUGMENTIN) 875-125 MG tablet Take 1 tablet by mouth 2 (two) times daily for 7 days.   aspirin EC 81 MG tablet Take 1 tablet (81 mg total) by mouth daily. Hold until follow up with GI   Blood Glucose Monitoring Suppl (ACCU-CHEK AVIVA PLUS) w/Device KIT accucheck aviva meter Once daily testing DX e11.9   budesonide-formoterol (SYMBICORT) 80-4.5 MCG/ACT inhaler INHALE 2 PUFFS INTO LUNGS TWICE DAILY.   busPIRone (BUSPAR) 7.5 MG tablet TAKE 1 TABLET BY MOUTH 3 TIMES DAILY.   GLIPIZIDE XL 5 MG 24 hr tablet TAKE 1 TABLET DAILY WITH BREAKFAST.   glucose blood (ACCU-CHEK AVIVA PLUS) test strip USE TO TEST ONCE DAILY   ipratropium-albuterol (DUONEB) 0.5-2.5 (3) MG/3ML SOLN USE 1 VIAL BY NEBULIZATION EVERY 4 TO 6 HOURS   olmesartan (BENICAR) 40 MG tablet TAKE 1 TABLET BY MOUTH ONCE A DAY.   pantoprazole (PROTONIX) 40 MG tablet Take 1 tablet (40 mg total) by mouth daily.   pravastatin (PRAVACHOL) 20 MG tablet TAKE (1) TABLET BY MOUTH AT BEDTIME FOR CHOLESTEROL.   risperiDONE (RISPERDAL) 1 MG tablet TAKE (1) TABLET BY MOUTH AT BEDTIME WITH '4MG'$ .   risperidone (RISPERDAL) 4 MG tablet Takes with a 1 mg tablet at night to = 5 mg dose   traZODone (DESYREL) 50 MG tablet Take 1 tablet (50 mg total) by mouth at bedtime as needed. for sleep   triamterene-hydrochlorothiazide (MAXZIDE-25) 37.5-25 MG tablet Take 0.5  tablets by mouth daily.   UNABLE TO FIND Nebulizer mouthpiece and tubing x 1  DX COPD   UNABLE TO FIND XL pullups for daily use DX Incontinence   UNABLE TO FIND Nebulizer tubing x 1  DX J44.9    ROS:  Review of Systems  Constitutional: Negative.  Negative for malaise/fatigue.  HENT: Negative.   Eyes: Negative.   Respiratory: Negative.  Negative for cough, shortness of breath and wheezing.   Cardiovascular: Negative.  Negative for palpitations, orthopnea and leg swelling.       Some lung/chest discomfort-thinks it might be indigestion. Just started today.   Gastrointestinal: Negative for blood in stool and melena.  Genitourinary: Negative for hematuria.  Musculoskeletal: Negative.   Skin: Negative.   Neurological: Negative.  Negative for dizziness and headaches.  Endo/Heme/Allergies: Negative.   Psychiatric/Behavioral: Negative.   All other systems reviewed and are negative.    Objective:   Today's Vitals: BP 124/70    Pulse Marland Kitchen)  104    Temp 98.2 F (36.8 C) (Temporal)    Resp 15    Ht '6\' 1"'$  (1.854 m)    Wt 244 lb 0.6 oz (110.7 kg)    SpO2 97%    BMI 32.20 kg/m  Vitals with BMI 01/14/2020 01/09/2020 01/08/2020  Height '6\' 1"'$  '6\' 1"'$  -  Weight 244 lbs 1 oz 240 lbs -  BMI 60.1 09.32 -  Systolic 355 732 202  Diastolic 70 79 92  Pulse 542 79 88  Some encounter information is confidential and restricted. Go to Review Flowsheets activity to see all data.     Physical Exam Vitals and nursing note reviewed.  Constitutional:      Appearance: Normal appearance. He is well-developed and well-groomed. He is obese.  HENT:     Head: Normocephalic and atraumatic.     Right Ear: External ear normal.     Left Ear: External ear normal.     Nose: Nose normal.     Mouth/Throat:     Mouth: Mucous membranes are moist.     Pharynx: Oropharynx is clear.  Eyes:     General:        Right eye: No discharge.        Left eye: No discharge.     Conjunctiva/sclera: Conjunctivae normal.   Cardiovascular:     Rate and Rhythm: Regular rhythm. Tachycardia present. Occasional extrasystoles are present.    Pulses: Normal pulses.     Heart sounds: Normal heart sounds.  Pulmonary:     Effort: Pulmonary effort is normal.     Breath sounds: Normal breath sounds.  Musculoskeletal:        General: Normal range of motion.     Cervical back: Normal range of motion and neck supple.  Skin:    General: Skin is warm.  Neurological:     General: No focal deficit present.     Mental Status: He is alert and oriented to person, place, and time.  Psychiatric:        Attention and Perception: Attention normal.        Mood and Affect: Mood normal.        Speech: Speech normal.        Behavior: Behavior normal. Behavior is cooperative.        Thought Content: Thought content normal.        Cognition and Memory: Cognition normal.        Judgment: Judgment normal.    Assessment   1. Encounter for support and coordination of transition of care   2. Chronic bronchitis, unspecified chronic bronchitis type ([place])   3. Schizophrenia, unspecified type ([place])   4. Diabetes mellitus type 2 in obese (HCC)   5. Obesity (BMI 30.0-34.9)   6. Diverticulitis of colon   7. Acute blood loss anemia   8. Tachycardia   9. QT prolongation   10. Tobacco abuse     Tests ordered Orders Placed This Encounter  Procedures   CBC with Differential/Platelet   COMPLETE METABOLIC PANEL WITH GFR   Magnesium   EKG 12-Lead    Plan: Please see assessment and plan per problem list above.   No orders of the defined types were placed in this encounter.   Patient to follow-up in 1 week for pulse check  Perlie Mayo, NP

## 2020-01-14 NOTE — Assessment & Plan Note (Signed)
Controlled continue risperidone at this time.  Did have noted QT prolongation on EKG has not been seen in the past.  Will be following up in 1 week in addition to getting labs.  Change in medication might be needed.

## 2020-01-20 ENCOUNTER — Other Ambulatory Visit: Payer: Self-pay | Admitting: Family Medicine

## 2020-01-20 DIAGNOSIS — J449 Chronic obstructive pulmonary disease, unspecified: Secondary | ICD-10-CM

## 2020-01-22 ENCOUNTER — Encounter: Payer: Self-pay | Admitting: Family Medicine

## 2020-01-22 ENCOUNTER — Other Ambulatory Visit: Payer: Self-pay

## 2020-01-22 ENCOUNTER — Ambulatory Visit (INDEPENDENT_AMBULATORY_CARE_PROVIDER_SITE_OTHER): Payer: Medicare Other | Admitting: Family Medicine

## 2020-01-22 VITALS — BP 115/67 | HR 101 | Temp 98.0°F | Ht 73.0 in | Wt 240.0 lb

## 2020-01-22 DIAGNOSIS — M791 Myalgia, unspecified site: Secondary | ICD-10-CM

## 2020-01-22 DIAGNOSIS — R519 Headache, unspecified: Secondary | ICD-10-CM

## 2020-01-22 DIAGNOSIS — R000 Tachycardia, unspecified: Secondary | ICD-10-CM | POA: Diagnosis not present

## 2020-01-22 DIAGNOSIS — R9431 Abnormal electrocardiogram [ECG] [EKG]: Secondary | ICD-10-CM

## 2020-01-22 NOTE — Assessment & Plan Note (Signed)
Unable to get him to come into the office for follow-up on this.  I have advised for him to go to the nearest emergency room or urgent care given that he just overall does not feel well.  And I cannot identify a cause neither can he.  He reports that he feels he does not feel any better.  He denies having any active chest pain or signs or symptoms of MI.

## 2020-01-22 NOTE — Assessment & Plan Note (Signed)
Still at low baseline tachycardia.  Did not want to come into the office today as he said he did not feel good.  Reports that he was feeling fine but now he started to feel bad.  Did not have any active chest pain advised to go to the nearest emergency room or urgent care he says that he will wait if he does not feel any better he will call him.

## 2020-01-22 NOTE — Addendum Note (Signed)
Addended by: Perlie Mayo on: 01/22/2020 12:04 PM   Modules accepted: Level of Service

## 2020-01-22 NOTE — Assessment & Plan Note (Signed)
Reports allover body aches does not have any specific area that is bothering him.  Possible contraction of Covid while he was in the hospital.  Unsure might need to get tested currently he does not want to go to the emergency room urgent care or even come into the office.

## 2020-01-22 NOTE — Patient Instructions (Signed)
Happy New Year! May you have a year filled with hope, love, happiness and laughter.  I appreciate the opportunity to provide you with care for your health and wellness. Today we discussed: Pulse check and overall not feeling well  Follow up: Follow-up as scheduled  No labs or referrals today   Is recommended that she go to the nearest urgent care, or emergency room facility if he continued to feel worse.  As we are not able to get you into the office today.  He did have changes on your EKG at the last visit that makes me more concerned when you are not feeling well.  Please consider going into the urgent care or emergency room should your symptoms change or worsen.   Please continue to practice social distancing to keep you, your family, and our community safe.  If you must go out, please wear a mask and practice good handwashing.  It was a pleasure to see you and I look forward to continuing to work together on your health and well-being. Please do not hesitate to call the office if you need care or have questions about your care.  Have a wonderful day and week. With Gratitude, Cherly Beach, DNP, AGNP-BC

## 2020-01-22 NOTE — Assessment & Plan Note (Signed)
Reports that he has ongoing headache I advised for him to take some Tylenol and drink some water.  If the symptoms get worse he might need to be tested for Covid he needs to follow-up with an urgent care or emergency room should he develop chest pain or just overall not feeling any better.  He reports that he will if he does not feel any better as he did refuse to come into the office today.

## 2020-01-22 NOTE — Progress Notes (Signed)
Virtual Visit via Telephone Note   This visit type was conducted due to national recommendations for restrictions regarding the COVID-19 Pandemic (e.g. social distancing) in an effort to limit this patient's exposure and mitigate transmission in our community.  Due to his co-morbid illnesses, this patient is at least at moderate risk for complications without adequate follow up.  This format is felt to be most appropriate for this patient at this time.  The patient did not have access to video technology/had technical difficulties with video requiring transitioning to audio format only (telephone).  All issues noted in this document were discussed and addressed.  No physical exam could be performed with this format.   Evaluation Performed:  Follow-up visit  Date:  01/22/2020   ID:  Matthew Molina, DOB 1950/12/29, MRN [id number]  Patient Location: Home Provider Location: Office  Location of Patient: Home Location of Provider: Telehealth Consent was obtain for visit to be over via telehealth. I verified that I am speaking with the correct person using two identifiers.  PCP:  Matthew Helper, MD   Chief Complaint: Follow-up pulse check  History of Present Illness:    Matthew Molina is a 69 y.o. male with medical history ofCOPD, diabetes mellitus type 2, depression, hyperlipidemia, hypertension, tobacco abuse, CKD stage III.    I saw [name] on January 27 for a transitional care visit after going to the emergency room with rectal bleeding.  He had diffuse diverticulosis with some inflammatory changes.  Hemoglobin was stable at discharge at 9.  In addition to stable kidney function.  I checked his labs kidney function was better and hemoglobin had improved to 10.3.  But did have a demonstrated low sodium at 130.  He had some mild complaints of chest discomfort which he thought was indigestion that he just started that day.  He was tachypneic and had occasional extra beat so I got an  EKG on him which demonstrated a prolonged QT-he is on respirator at this time he has not had noted QT prolongation on other EKGs in the past..  I asked if he was drinking enough water and electrolytes.  He reports that he was drinking a lot of Diet Coke.  Electrolytes seem to be stable.  Hemoglobin with rebuilding.  Secondary to the elevated pulse and just not sure if he had indigestion I advised for him to do a pulse follow-up this week.  However he calls in today saying that he is not feeling well all over his body he denies having active chest pain.  He denies feeling dizzy or short of breath.  He did not have any cough fevers or chills.  He did note that he felt like he might had a fever but he did have one when he checked it at home.  Blood pressure has been stable.  He reports that he ate some beef stew and not long after that he started feeling bad.  He denies having any nausea or vomiting diarrhea or constipation issues.   He declined wanting to come into the office or go to the emergency room or urgent care to be checked out to make sure that there was nothing else going on.  Reports that he would just like to lay down.  The patient does not have symptoms concerning for COVID-19 infection (fever, chills, cough, or new shortness of breath).   Past Medical, Surgical, Social History, Allergies, and Medications have been Reviewed.  Past Medical History:  Diagnosis Date  . Acute kidney injury ([place])   . Acute respiratory failure with hypoxia ([place])   . Arthritis   . COPD (chronic obstructive pulmonary disease) ([place])   . Depression   . Diabetes mellitus   . Diabetes mellitus without complication ([place])   . Elevated PSA 10/01/2016  . Hypercholesterolemia   . Hyperlipidemia   . Hypertension   . Insomnia 01/05/2016  . Multiple lung nodules on CT 04/02/2015  . Nicotine addiction   . Obesity   . Oxygen deficiency    qhs  . Schizophrenia [facility])    Past Surgical History:  Procedure Laterality  Date  . CATARACT EXTRACTION W/PHACO Left 11/20/2013   Procedure: CATARACT EXTRACTION PHACO AND INTRAOCULAR LENS PLACEMENT (IOC);  Surgeon: Tonny Branch, MD;  Location: [facility] ORS;  Service: Ophthalmology;  Laterality: Left;  CDE:10.26  . CATARACT EXTRACTION W/PHACO Right 12/08/2013   Procedure: RIGHT EYE CATARACT EXTRACTION PHACO AND INTRAOCULAR LENS PLACEMENT ;  Surgeon: Tonny Branch, MD;  Location: [facility] ORS;  Service: Ophthalmology;  Laterality: Right;  CDE 12.38  . COLONOSCOPY N/A 04/01/2013   pancolonic diverticulosis, redundant colon, large internal hemorrhoids.   . COLONOSCOPY  2014   INCOMPLETE PREP IN R COLON  . ESOPHAGOGASTRODUODENOSCOPY N/A 02/18/2016   stricture at GE junction, moderate erosive gastritis and mild non-erosive duodenitis. +H.pylori. Treated initially with Prevpac. Breath test to check for eradication was positive, and he was prescribed Pylera.   Marland Kitchen EYE SURGERY Left 11/2013   cataract extraction  . SAVORY DILATION N/A 02/18/2016   Procedure: SAVORY DILATION;  Surgeon: Danie Binder, MD;  Location: [facility] END[facility] SU[facility];  Service: Endoscopy;  Laterality: N/A;  . SHOULDER SURGERY       Current Meds  Medication Sig  . Accu-Chek Softclix Lancets lancets TEST BLOOD SUGAR ONCE DAILY AS DIRECTED.  Marland Kitchen acetaminophen (TYLENOL) 500 MG tablet Take 500-1,000 mg by mouth daily as needed. Takes mostly for HA about 2 x/week.  Marland Kitchen albuterol (VENTOLIN HFA) 108 (90 Base) MCG/ACT inhaler INHALE 2 PUFFS INTO THE LUNGS EVERY 6 HOURS AS NEEDED FOR WHEEZING OR SHORTNESS OF BREATH.  Marland Kitchen aspirin EC 81 MG tablet Take 1 tablet (81 mg total) by mouth daily. Hold until follow up with GI  . Blood Glucose Monitoring Suppl (ACCU-CHEK AVIVA PLUS) w/Device KIT accucheck aviva meter Once daily testing DX e11.9  . budesonide-formoterol (SYMBICORT) 80-4.5 MCG/ACT inhaler INHALE 2 PUFFS INTO LUNGS TWICE DAILY.  . busPIRone (BUSPAR) 7.5 MG tablet TAKE 1 TABLET BY MOUTH 3 TIMES DAILY.  Marland Kitchen GLIPIZIDE XL 5 MG 24 hr tablet TAKE 1 TABLET  DAILY WITH BREAKFAST.  Marland Kitchen glucose blood (ACCU-CHEK AVIVA PLUS) test strip USE TO TEST ONCE DAILY  . ipratropium-albuterol (DUONEB) 0.5-2.5 (3) MG/3ML SOLN USE 1 VIAL BY NEBULIZATION EVERY 4 TO 6 HOURS  . olmesartan (BENICAR) 40 MG tablet TAKE 1 TABLET BY MOUTH ONCE A DAY.  . pantoprazole (PROTONIX) 40 MG tablet Take 1 tablet (40 mg total) by mouth daily.  . pravastatin (PRAVACHOL) 20 MG tablet TAKE (1) TABLET BY MOUTH AT BEDTIME FOR CHOLESTEROL.  Marland Kitchen risperiDONE (RISPERDAL) 1 MG tablet TAKE (1) TABLET BY MOUTH AT BEDTIME WITH '4MG'$ .  . risperidone (RISPERDAL) 4 MG tablet Takes with a 1 mg tablet at night to = 5 mg dose  . traZODone (DESYREL) 50 MG tablet Take 1 tablet (50 mg total) by mouth at bedtime as needed. for sleep  . triamterene-hydrochlorothiazide (MAXZIDE-25) 37.5-25 MG tablet Take 0.5 tablets by mouth daily.  Marland Kitchen UNABLE TO  FIND Nebulizer mouthpiece and tubing x 1  DX COPD  . UNABLE TO FIND XL pullups for daily use DX Incontinence  . UNABLE TO FIND Nebulizer tubing x 1  DX J44.9     Allergies:   Sertraline hcl and Wellbutrin [bupropion]   ROS:   Please see the history of present illness.    All other systems reviewed and are negative.   Labs/Other Tests and Data Reviewed:    Recent Labs: 01/14/2020: ALT 57; BUN 9; Creat 1.21; Hemoglobin 10.3; Magnesium 2.1; Platelets 409; Potassium 3.9; Sodium 130   Recent Lipid Panel Lab Results  Component Value Date/Time   CHOL 158 07/09/2019 07:32 AM   TRIG 161 (H) 07/09/2019 07:32 AM   HDL 36 (L) 07/09/2019 07:32 AM   CHOLHDL 4.4 07/09/2019 07:32 AM   LDLCALC 96 07/09/2019 07:32 AM    Wt Readings from Last 3 Encounters:  01/22/20 240 lb (108.9 kg)  01/14/20 244 lb 0.6 oz (110.7 kg)  01/09/20 240 lb (108.9 kg)     Objective:    Vital Signs:  BP 115/67   Pulse (!) 101   Temp 98 F (36.7 C) (Oral)   Ht '6\' 1"'$  (1.854 m)   Wt 240 lb (108.9 kg)   BMI 31.66 kg/m    VITAL SIGNS:  reviewed GEN:  Alert and oriented RESPIRATORY:   No shortness of breath noted in conversation or cough. PSYCH:  Normal affect and mood at baseline good communication clear communication  ASSESSMENT & PLAN:    1. QT prolongation   2. Tachycardia   3. Myalgia   4. Acute nonintractable headache, unspecified headache type    Time:   Today, I have spent  10 minutes with the patient with telehealth technology discussing the above problems.     Medication Adjustments/Labs and Tests Ordered: Current medicines are reviewed at length with the patient today.  Concerns regarding medicines are outlined above.   Tests Ordered: No orders of the defined types were placed in this encounter.   Medication Changes: No orders of the defined types were placed in this encounter.   Disposition:  Follow up 07/15/2020  Signed, Perlie Mayo, NP  01/22/2020 11:20 AM     [facility]

## 2020-01-26 ENCOUNTER — Other Ambulatory Visit: Payer: Self-pay | Admitting: Family Medicine

## 2020-02-02 ENCOUNTER — Telehealth: Payer: Self-pay

## 2020-02-02 ENCOUNTER — Other Ambulatory Visit: Payer: Self-pay | Admitting: Family Medicine

## 2020-02-02 DIAGNOSIS — J449 Chronic obstructive pulmonary disease, unspecified: Secondary | ICD-10-CM | POA: Diagnosis not present

## 2020-02-06 ENCOUNTER — Other Ambulatory Visit: Payer: Self-pay | Admitting: Family Medicine

## 2020-02-09 ENCOUNTER — Other Ambulatory Visit: Payer: Self-pay | Admitting: Family Medicine

## 2020-02-09 DIAGNOSIS — J449 Chronic obstructive pulmonary disease, unspecified: Secondary | ICD-10-CM

## 2020-02-11 ENCOUNTER — Ambulatory Visit: Payer: Medicare Other | Admitting: Gastroenterology

## 2020-02-24 ENCOUNTER — Ambulatory Visit: Payer: Medicare Other | Admitting: Gastroenterology

## 2020-03-01 DIAGNOSIS — J449 Chronic obstructive pulmonary disease, unspecified: Secondary | ICD-10-CM | POA: Diagnosis not present

## 2020-03-05 ENCOUNTER — Other Ambulatory Visit: Payer: Self-pay | Admitting: Family Medicine

## 2020-03-05 DIAGNOSIS — J449 Chronic obstructive pulmonary disease, unspecified: Secondary | ICD-10-CM

## 2020-03-10 ENCOUNTER — Emergency Department (HOSPITAL_COMMUNITY): Payer: Medicare Other

## 2020-03-10 ENCOUNTER — Other Ambulatory Visit: Payer: Self-pay

## 2020-03-10 ENCOUNTER — Encounter (HOSPITAL_COMMUNITY): Payer: Self-pay

## 2020-03-10 ENCOUNTER — Ambulatory Visit: Payer: Medicare Other | Admitting: Gastroenterology

## 2020-03-10 ENCOUNTER — Emergency Department (HOSPITAL_COMMUNITY)
Admission: EM | Admit: 2020-03-10 | Discharge: 2020-03-10 | Disposition: A | Discharge status: Home / Self Care | Payer: Medicare Other | Attending: Emergency Medicine | Admitting: Emergency Medicine

## 2020-03-10 DIAGNOSIS — R5381 Other malaise: Secondary | ICD-10-CM | POA: Diagnosis not present

## 2020-03-10 DIAGNOSIS — K5730 Diverticulosis of large intestine without perforation or abscess without bleeding: Secondary | ICD-10-CM | POA: Diagnosis not present

## 2020-03-10 DIAGNOSIS — I129 Hypertensive chronic kidney disease with stage 1 through stage 4 chronic kidney disease, or unspecified chronic kidney disease: Secondary | ICD-10-CM | POA: Insufficient documentation

## 2020-03-10 DIAGNOSIS — R000 Tachycardia, unspecified: Secondary | ICD-10-CM | POA: Diagnosis not present

## 2020-03-10 DIAGNOSIS — I2510 Atherosclerotic heart disease of native coronary artery without angina pectoris: Secondary | ICD-10-CM | POA: Insufficient documentation

## 2020-03-10 DIAGNOSIS — E860 Dehydration: Secondary | ICD-10-CM | POA: Insufficient documentation

## 2020-03-10 DIAGNOSIS — R197 Diarrhea, unspecified: Secondary | ICD-10-CM | POA: Insufficient documentation

## 2020-03-10 DIAGNOSIS — F17210 Nicotine dependence, cigarettes, uncomplicated: Secondary | ICD-10-CM | POA: Insufficient documentation

## 2020-03-10 DIAGNOSIS — N183 Chronic kidney disease, stage 3 unspecified: Secondary | ICD-10-CM | POA: Diagnosis not present

## 2020-03-10 DIAGNOSIS — E1122 Type 2 diabetes mellitus with diabetic chronic kidney disease: Secondary | ICD-10-CM | POA: Insufficient documentation

## 2020-03-10 DIAGNOSIS — R109 Unspecified abdominal pain: Secondary | ICD-10-CM | POA: Insufficient documentation

## 2020-03-10 DIAGNOSIS — Z743 Need for continuous supervision: Secondary | ICD-10-CM | POA: Diagnosis not present

## 2020-03-10 DIAGNOSIS — R52 Pain, unspecified: Secondary | ICD-10-CM | POA: Diagnosis not present

## 2020-03-10 DIAGNOSIS — R531 Weakness: Secondary | ICD-10-CM | POA: Diagnosis not present

## 2020-03-10 DIAGNOSIS — F449 Dissociative and conversion disorder, unspecified: Secondary | ICD-10-CM | POA: Diagnosis not present

## 2020-03-10 DIAGNOSIS — A09 Infectious gastroenteritis and colitis, unspecified: Secondary | ICD-10-CM | POA: Diagnosis not present

## 2020-03-10 LAB — CBC WITH DIFFERENTIAL/PLATELET
Abs Immature Granulocytes: 0.07 10*3/uL (ref 0.00–0.07)
Basophils Absolute: 0.1 10*3/uL (ref 0.0–0.1)
Basophils Relative: 0 %
Eosinophils Absolute: 1.5 10*3/uL — ABNORMAL HIGH (ref 0.0–0.5)
Eosinophils Relative: 10 %
HCT: 47.7 % (ref 39.0–52.0)
Hemoglobin: 15.2 g/dL (ref 13.0–17.0)
Immature Granulocytes: 1 %
Lymphocytes Relative: 13 %
Lymphs Abs: 1.9 10*3/uL (ref 0.7–4.0)
MCH: 28.5 pg (ref 26.0–34.0)
MCHC: 31.9 g/dL (ref 30.0–36.0)
MCV: 89.3 fL (ref 80.0–100.0)
Monocytes Absolute: 1 10*3/uL (ref 0.1–1.0)
Monocytes Relative: 7 %
Neutro Abs: 10.3 10*3/uL — ABNORMAL HIGH (ref 1.7–7.7)
Neutrophils Relative %: 69 %
Platelets: 304 10*3/uL (ref 150–400)
RBC: 5.34 MIL/uL (ref 4.22–5.81)
RDW: 14.1 % (ref 11.5–15.5)
WBC: 14.8 10*3/uL — ABNORMAL HIGH (ref 4.0–10.5)
nRBC: 0 % (ref 0.0–0.2)

## 2020-03-10 LAB — COMPREHENSIVE METABOLIC PANEL
ALT: 30 U/L (ref 0–44)
AST: 22 U/L (ref 15–41)
Albumin: 4.3 g/dL (ref 3.5–5.0)
Alkaline Phosphatase: 70 U/L (ref 38–126)
Anion gap: 13 (ref 5–15)
BUN: 13 mg/dL (ref 8–23)
CO2: 23 mmol/L (ref 22–32)
Calcium: 11.4 mg/dL — ABNORMAL HIGH (ref 8.9–10.3)
Chloride: 94 mmol/L — ABNORMAL LOW (ref 98–111)
Creatinine, Ser: 1.91 mg/dL — ABNORMAL HIGH (ref 0.61–1.24)
GFR calc Af Amer: 40 mL/min — ABNORMAL LOW (ref >60)
GFR calc non Af Amer: 35 mL/min — ABNORMAL LOW (ref >60)
Glucose, Bld: 197 mg/dL — ABNORMAL HIGH (ref 70–99)
Potassium: 4 mmol/L (ref 3.5–5.1)
Sodium: 130 mmol/L — ABNORMAL LOW (ref 135–145)
Total Bilirubin: 0.7 mg/dL (ref 0.3–1.2)
Total Protein: 8.1 g/dL (ref 6.5–8.1)

## 2020-03-10 LAB — C DIFFICILE QUICK SCREEN W PCR REFLEX
C Diff antigen: NEGATIVE
C Diff interpretation: NOT DETECTED
C Diff toxin: NEGATIVE

## 2020-03-10 LAB — LIPASE, BLOOD: Lipase: 20 U/L (ref 11–51)

## 2020-03-10 LAB — LACTIC ACID, PLASMA
Lactic Acid, Venous: 1.3 mmol/L (ref 0.5–1.9)
Lactic Acid, Venous: 1.2 mmol/L (ref 0.5–1.9)

## 2020-03-10 LAB — PROTIME-INR
INR: 1.1 (ref 0.8–1.2)
Prothrombin Time: 14.1 seconds (ref 11.4–15.2)

## 2020-03-10 LAB — POC OCCULT BLOOD, ED: Fecal Occult Bld: POSITIVE — AB

## 2020-03-10 MED ORDER — LOPERAMIDE HCL 2 MG PO CAPS: 2.0000 mg | ORAL_CAPSULE | Freq: Four times a day (QID) | ORAL | 0 refills | Status: DC | PRN

## 2020-03-10 MED ORDER — SODIUM CHLORIDE 0.9 % IV BOLUS
500.0000 mL | Freq: Once | INTRAVENOUS | Status: AC
  Administered 2020-03-10: 500 mL via INTRAVENOUS

## 2020-03-10 MED ORDER — LOPERAMIDE HCL 2 MG PO CAPS
4.0000 mg | ORAL_CAPSULE | Freq: Once | ORAL | Status: AC
  Administered 2020-03-10: 4 mg via ORAL
  Filled 2020-03-10: qty 2

## 2020-03-10 MED ORDER — AMOXICILLIN-POT CLAVULANATE 875-125 MG PO TABS: 1.0000 | ORAL_TABLET | Freq: Two times a day (BID) | ORAL | 0 refills | Status: AC

## 2020-03-10 MED ORDER — IOHEXOL 300 MG/ML  SOLN
75.0000 mL | Freq: Once | INTRAMUSCULAR | Status: AC | PRN
  Administered 2020-03-10: 75 mL via INTRAVENOUS

## 2020-03-10 NOTE — ED Notes (Signed)
Pt is aware we need urine sample, urinal at bedside.  

## 2020-03-10 NOTE — ED Triage Notes (Signed)
Pt reports that he has had diarrhea for 2 days and weakness. Mild abdominal pain.

## 2020-03-10 NOTE — Discharge Instructions (Signed)
You were seen in the emergency department today with diarrhea.  Your lab work and CT scans show possible infection causing your diarrhea.  I am starting you back on antibiotics and will have you call the gastroenterologist today to confirm your outpatient appointment.  If you develop worsening pain, continued severe diarrhea, lightheadedness, shortness of breath, fevers, or other severe symptoms you should return to the emergency department for reevaluation.

## 2020-03-10 NOTE — ED Provider Notes (Signed)
Emergency Department Provider Note   I have reviewed the triage vital signs and the nursing notes.   HISTORY  Chief Complaint Diarrhea   HPI Matthew Molina is a 69 y.o. male with PMH of COPD, HTN, HLD, and known history of diverticulosis presents to the emergency department for evaluation of diarrhea with abdominal pain and generalized weakness over the past 2 days.  Patient denies fever.  His abdomen hurts mainly in the center to left side of the abdomen.  He has had multiple episodes of nonbloody diarrhea without emesis.  He denies chest pain, shortness of breath, sore throat, headaches.  He has not restarted his blood thinner after his most recent hospitalization in January.  At that time he came in with similar presentation and was found to have diverticulosis with low hemoglobin and area of inflammation in the ascending colon.  He completed a course of Augmentin and was admitted for trending hemoglobins.  There was a plan for him to follow with GI for colonoscopy 6 weeks later.  Patient states he has not had that appointment as of yet.  He has not been on recent antibiotics.  He is not experiencing fevers or chills.   Past Medical History:  Diagnosis Date  . Acute kidney injury ([place])   . Acute respiratory failure with hypoxia ([place])   . Arthritis   . COPD (chronic obstructive pulmonary disease) ([place])   . Depression   . Diabetes mellitus   . Diabetes mellitus without complication ([place])   . Elevated PSA 10/01/2016  . Hypercholesterolemia   . Hyperlipidemia   . Hypertension   . Insomnia 01/05/2016  . Multiple lung nodules on CT 04/02/2015  . Nicotine addiction   . Obesity   . Oxygen deficiency    qhs  . Schizophrenia [facility])     Patient Active Problem List   Diagnosis Date Noted  . Myalgia 01/22/2020  . Encounter for support and coordination of transition of care 01/14/2020  . Tachycardia 01/14/2020  . QT prolongation 01/14/2020  . Acute nonintractable headache 01/09/2020   . Rectal bleeding   . Chronic respiratory failure with hypoxia ([place]) 01/06/2020  . Acute blood loss anemia 01/05/2020  . Ischemic colitis ([place]) 01/05/2020  . Lower GI bleed 01/05/2020  . Diverticulitis of colon   . Urinary incontinence 10/08/2019  . COPD with acute exacerbation ([place]) 04/17/2019  . Unsteady gait 04/18/2017  . GERD (gastroesophageal reflux disease) 08/24/2016  . CKD (chronic kidney disease) stage 3, GFR 30-59 ml/min ([facility]) 05/22/2016  . COPD (chronic obstructive pulmonary disease) ([place]) 11/05/2015  . Hypertension 11/05/2015  . Tobacco abuse 07/24/2015  . Coronary atherosclerosis of native coronary artery 04/02/2015  . Multiple lung nodules on CT 04/02/2015  . GAD (generalized anxiety disorder) 07/04/2012  . Back pain with radiation 11/01/2011  . Diabetes mellitus type 2 in obese ([place]) 04/17/2011  . Hyperlipidemia 06/02/2008  . Obesity (BMI 30.0-34.9) 06/02/2008  . Schizophrenia, unspecified type ([place]) 06/02/2008    Past Surgical History:  Procedure Laterality Date  . CATARACT EXTRACTION W/PHACO Left 11/20/2013   Procedure: CATARACT EXTRACTION PHACO AND INTRAOCULAR LENS PLACEMENT (IOC);  Surgeon: Tonny Branch, MD;  Location: [facility];  Service: Ophthalmology;  Laterality: Left;  CDE:10.26  . CATARACT EXTRACTION W/PHACO Right 12/08/2013   Procedure: RIGHT EYE CATARACT EXTRACTION PHACO AND INTRAOCULAR LENS PLACEMENT ;  Surgeon: Tonny Branch, MD;  Location: [facility]S;  Service: Ophthalmology;  Laterality: Right;  CDE 12.38  . COLONOSCOPY N/A 04/01/2013   pancolonic diverticulosis,  redundant colon, large internal hemorrhoids.   . COLONOSCOPY  2014   INCOMPLETE PREP IN R COLON  . ESOPHAGOGASTRODUODENOSCOPY N/A 02/18/2016   stricture at GE junction, moderate erosive gastritis and mild non-erosive duodenitis. +H.pylori. Treated initially with Prevpac. Breath test to check for eradication was positive, and he was prescribed Pylera.   Marland Kitchen EYE SURGERY Left 11/2013   cataract extraction  .  SAVORY DILATION N/A 02/18/2016   Procedure: SAVORY DILATION;  Surgeon: Danie Binder, MD;  Location: [facility];  Service: Endoscopy;  Laterality: N/A;  . SHOULDER SURGERY      Allergies Sertraline hcl and Wellbutrin [bupropion]  Family History  Problem Relation Age of Onset  . Diabetes Mother   . Hypertension Mother   . Stroke Mother   . Diabetes Sister   . Heart disease Sister   . Kidney disease Father   . Drug abuse Brother   . Colon cancer Neg Hx   . Colon polyps Neg Hx     Social History Social History   Tobacco Use  . Smoking status: Current Every Day Smoker    Packs/day: 1.00    Years: 48.00    Pack years: 48.00    Types: Cigarettes    Last attempt to quit: 05/02/2017    Years since quitting: 2.8  . Smokeless tobacco: Never Used  Substance Use Topics  . Alcohol use: Not Currently    Alcohol/week: 0.0 standard drinks  . Drug use: No    Review of Systems  Constitutional: No fever/chills. Positive generalized weakness.  Eyes: No visual changes. ENT: No sore throat. Cardiovascular: Denies chest pain. Respiratory: Denies shortness of breath. Gastrointestinal: Positive abdominal pain.  No nausea, no vomiting.  Positive diarrhea.  No constipation. Genitourinary: Negative for dysuria. Musculoskeletal: Negative for back pain. Skin: Negative for rash. Neurological: Negative for headaches, focal weakness or numbness.  10-point ROS otherwise negative.  ____________________________________________   PHYSICAL EXAM:  VITAL SIGNS: ED Triage Vitals  Enc Vitals Group     BP 03/10/20 0813 95/78     Pulse Rate 03/10/20 0813 (!) 114     Resp 03/10/20 0813 18     Temp 03/10/20 0813 (!) 97.5 F (36.4 C)     Temp Source 03/10/20 0813 Oral     SpO2 03/10/20 0813 95 %     Weight 03/10/20 0814 240 lb (108.9 kg)     Height 03/10/20 0814 6\' 1"  (1.854 m)   Constitutional: Alert and oriented. Well appearing and in no acute distress. Eyes: Conjunctivae are normal.   Head: Atraumatic. Nose: No congestion/rhinnorhea. Mouth/Throat: Mucous membranes are somewhat dry.  Neck: No stridor.   Cardiovascular: Tachycardia. Good peripheral circulation. Grossly normal heart sounds.   Respiratory: Normal respiratory effort.  No retractions. Lungs CTAB. Gastrointestinal: Soft with mild diffuse tenderness. No focal tenderness or peritonitis. No distention.  Musculoskeletal: No gross deformities of extremities. Neurologic:  Normal speech and language.  Skin:  Skin is warm, dry and intact. No rash noted.   ____________________________________________   LABS (all labs ordered are listed, but only abnormal results are displayed)  Labs Reviewed  COMPREHENSIVE METABOLIC PANEL - Abnormal; Notable for the following components:      Result Value   Sodium 130 (*)    Chloride 94 (*)    Glucose, Bld 197 (*)    Creatinine, Ser 1.91 (*)    Calcium 11.4 (*)    GFR calc non Af Amer 35 (*)    GFR calc Af Amer 40 (*)  All other components within normal limits  CBC WITH DIFFERENTIAL/PLATELET - Abnormal; Notable for the following components:   WBC 14.8 (*)    Neutro Abs 10.3 (*)    Eosinophils Absolute 1.5 (*)    All other components within normal limits  POC OCCULT BLOOD, ED - Abnormal; Notable for the following components:   Fecal Occult Bld POSITIVE (*)    All other components within normal limits  C DIFFICILE QUICK SCREEN W PCR REFLEX  LIPASE, BLOOD  LACTIC ACID, PLASMA  LACTIC ACID, PLASMA  PROTIME-INR  URINALYSIS, ROUTINE W REFLEX MICROSCOPIC   ____________________________________________  EKG   EKG Interpretation  Date/Time:  Wednesday March 10 2020 08:38:13 EDT Ventricular Rate:  114 PR Interval:    QRS Duration: 84 QT Interval:  329 QTC Calculation: 453 R Axis:   -76 Text Interpretation: Sinus tachycardia Atrial premature complex Left anterior fascicular block Consider right ventricular hypertrophy Consider anterior infarct No STEMI Confirmed  by Nanda Quinton [phone]) on 03/10/2020 8:42:14 AM       ____________________________________________  RADIOLOGY  CT ABDOMEN PELVIS W CONTRAST  Result Date: 03/10/2020 CLINICAL DATA:  Left lower quadrant pain with diarrhea and Hemoccult-positive stool. EXAM: CT ABDOMEN AND PELVIS WITH CONTRAST TECHNIQUE: Multidetector CT imaging of the abdomen and pelvis was performed using the standard protocol following bolus administration of intravenous contrast. CONTRAST:  9mL OMNIPAQUE IOHEXOL 300 MG/ML  SOLN COMPARISON:  01/05/2020 FINDINGS: Lower chest: Tiny amount left pleural fluid. Minimal calcified plaque over the descending thoracic aorta. Calcified plaque over the lateral circumflex and right coronary arteries. Small amount of fluid over the distal esophagus which may be due to reflux versus dysmotility. Hepatobiliary: Liver and biliary tree are normal. Possible subtle cholelithiasis. Pancreas: Normal. Spleen: Normal. Adrenals/Urinary Tract: Adrenal glands are unremarkable. Kidneys are normal size with bilateral renal cysts unchanged. No evidence of hydronephrosis or nephrolithiasis. Ureters and bladder are normal. Stomach/Bowel: Stomach is unremarkable. Minimal wall thickening of several jejunal loops which is nonspecific and may be due to mild regional enteritis of infectious or inflammatory nature. Appendix is normal. Interval resolution of previously seen acute diverticulitis involving the ascending colon. There is diverticulosis throughout the colon. Vascular/Lymphatic: Mild calcified plaque over the abdominal aorta and iliac vessels. Aorta is normal caliber. No adenopathy. Reproductive: Mild stable prostatic enlargement. Other: No significant free fluid or focal inflammatory change. Small midline supraumbilical hernia containing only peritoneal fat. Musculoskeletal: Mild degenerative change of the spine and hips. IMPRESSION: 1. Mild diverticulosis throughout the colon. Interval resolution of acute  diverticulitis involving the ascending colon. 2. Subtle nonspecific wall thickening over several jejunal loops which may reflect regional enteritis of inflammatory or infectious nature. 3.  Possible subtle cholelithiasis. 4. Small amount of fluid over the distal esophagus which may be due to reflux or dysmotility. Small supraumbilical midline ventral hernia containing only peritoneal fat unchanged. 5. Aortic Atherosclerosis (ICD10-I70.0). Atherosclerotic coronary artery disease. 6.  Bilateral renal cysts unchanged. Electronically Signed   By: Marin Olp M.D.   On: 03/10/2020 10:14    ____________________________________________   PROCEDURES  Procedure(s) performed:   Procedures  None  ____________________________________________   INITIAL IMPRESSION / ASSESSMENT AND PLAN / ED COURSE  Pertinent labs & imaging results that were available during my care of the patient were reviewed by me and considered in my medical decision making (see chart for details).   Patient presents with abdominal pain and diarrhea.  Nursing collected a Hemoccult sample which showed brown stool but was Hemoccult positive.  Diffuse mild  tenderness on abdominal exam with tachycardia.  Blood pressure with systolics in the low [id number].  Reviewed the prior discharge summary from January.  He did have a telehealth visit with his family medicine doctor in early February but this symptom seem to have resolved.  Plan for labs, type and screen, CT abdomen pelvis, and reassess.   02:00 PM  C. difficile antigen and toxin are negative.  CT imaging shows resolution of inflammatory process seen during prior admission.  Does have some jejunal inflammation.  Plan to restart Augmentin and continue oral hydration at home.  Patient does have a tachycardia here but this was noted during his last admission and in PCP notes.  He is being worked up for this as an outpatient.  He appears well-hydrated.  He is standing and walking in the  emergency department without symptoms.  He is feeling well and asking about discharge.  Will encourage him to keep his follow-up appointment with gastroenterology and call today to confirm that appointment.  I reviewed all nursing notes, vitals, pertinent old records, EKGs, labs, imaging (as available).  ____________________________________________  FINAL CLINICAL IMPRESSION(S) / ED DIAGNOSES  Final diagnoses:  Diarrhea of presumed infectious origin  Dehydration     MEDICATIONS GIVEN DURING THIS VISIT:  Medications  sodium chloride 0.9 % bolus 500 mL (0 mLs Intravenous Stopped 03/10/20 1135)  loperamide (IMODIUM) capsule 4 mg (4 mg Oral Given 03/10/20 0901)  iohexol (OMNIPAQUE) 300 MG/ML solution 75 mL (75 mLs Intravenous Contrast Given 03/10/20 0951)  sodium chloride 0.9 % bolus 500 mL (500 mLs Intravenous New Bag/Given 03/10/20 1135)     NEW OUTPATIENT MEDICATIONS STARTED DURING THIS VISIT:  New Prescriptions   AMOXICILLIN-CLAVULANATE (AUGMENTIN) 875-125 MG TABLET    Take 1 tablet by mouth every 12 (twelve) hours for 7 days.   LOPERAMIDE (IMODIUM) 2 MG CAPSULE    Take 1 capsule (2 mg total) by mouth 4 (four) times daily as needed for diarrhea or loose stools.    Note:  This document was prepared using Dragon voice recognition software and may include unintentional dictation errors.  Nanda Quinton, MD, [facility]    Cidney Kirkwood, [facility], MD 03/10/20 [phone]

## 2020-03-18 ENCOUNTER — Encounter: Payer: Self-pay | Admitting: Nurse Practitioner

## 2020-03-18 ENCOUNTER — Ambulatory Visit (INDEPENDENT_AMBULATORY_CARE_PROVIDER_SITE_OTHER): Payer: Medicare Other | Admitting: Nurse Practitioner

## 2020-03-18 ENCOUNTER — Other Ambulatory Visit: Payer: Self-pay

## 2020-03-18 ENCOUNTER — Telehealth: Payer: Self-pay

## 2020-03-18 ENCOUNTER — Telehealth: Payer: Self-pay | Admitting: *Deleted

## 2020-03-18 VITALS — BP 136/89 | HR 103 | Temp 98.2°F | Ht 73.0 in | Wt 239.8 lb

## 2020-03-18 DIAGNOSIS — K922 Gastrointestinal hemorrhage, unspecified: Secondary | ICD-10-CM | POA: Diagnosis not present

## 2020-03-18 DIAGNOSIS — R1084 Generalized abdominal pain: Secondary | ICD-10-CM | POA: Diagnosis not present

## 2020-03-18 DIAGNOSIS — K649 Unspecified hemorrhoids: Secondary | ICD-10-CM | POA: Diagnosis not present

## 2020-03-18 DIAGNOSIS — R109 Unspecified abdominal pain: Secondary | ICD-10-CM | POA: Insufficient documentation

## 2020-03-18 DIAGNOSIS — K219 Gastro-esophageal reflux disease without esophagitis: Secondary | ICD-10-CM | POA: Diagnosis not present

## 2020-03-18 MED ORDER — PANTOPRAZOLE SODIUM 40 MG PO TBEC: 40.0000 mg | DELAYED_RELEASE_TABLET | Freq: Every day | ORAL | 3 refills | Status: DC

## 2020-03-18 MED ORDER — HYDROCORTISONE (PERIANAL) 2.5 % EX CREA: 1.0000 "application " | TOPICAL_CREAM | Freq: Two times a day (BID) | CUTANEOUS | 1 refills | Status: DC | PRN

## 2020-03-18 NOTE — Progress Notes (Addendum)
REVIEWED-NO ADDITIONAL RECOMMENDATIONS.  Referring Provider: Fayrene Helper, MD Primary Care Physician:  Fayrene Helper, MD Primary GI:  Dr. Oneida Alar  Chief Complaint  Patient presents with  . hfu    no diarrhea, no RB  . Abdominal Pain    "sometimes" comes/goes    HPI:   Matthew Molina is a 69 y.o. male who presents for hospital follow-up.  The patient was admitted to the hospital 01/05/2020 through 01/08/2020 for CKD and acute blood loss anemia likely due to lower GI bleed and possible ischemic colitis.  He presented to the emergency room for morning time rectal bleeding and associated right-sided abdominal pain with passage of voluminous amounts of blood with clots.  Hypotensive in the ER and tachycardic as well.  Hemoglobin in the ER 12.2.  CT of the abdomen and pelvis with diffuse diverticulosis with asymmetric area of focal inflammatory change in the ascending colon.  Noted acute blood loss anemia in the setting of diverticulitis and ischemic colitis with hemoglobin stabilization around 9.0.  By discharge she was asymptomatic.  Recommended 7 days of Augmentin after discharge and continue PPI daily with outpatient GI follow-up.  Recommended outpatient colonoscopy at follow-up and consider testing for eradication of H. pylori when off antibiotics and able to hold PPI for 2 weeks.  It appears on 03/11/2019 when the patient presented to the emergency department with abdominal pain and diarrhea that was heme positive.  Diffuse mild abdominal tenderness noted.  C. difficile negative.  CT with resolution of inflammatory process seen during prior admission.  Some jejunal inflammation noted.  Recommended restart Augmentin and continue oral hydration at home.  He was discharged in satisfactory condition with follow-up appointment with GI upcoming.  Today he states he's doing ok overall. Better then when he went into the hospital but not 100% yet. He feels he is heading in the right direction.  No more bleeding, no more diarrhea but stool is not "coming like it used to come" and more pasty. Previously with [place] 4 stools. Stools are dark now and not on iron. Eating typical foods. Dark stools started after hospitalization. On Protonix for GERD which prevents symptoms. Needs refill on Protonix. Abdominal pain is intermittent, not bad; more moderate. Intermittent nausea, rare vomiting. Denies hematochezia, fever, chills, unintentional weight loss. Denies URI or flu-like symptoms. Denies loss of sense of taste or smell. Was COVID-19 negative in the hospital. Got the first COVID-19 vaccine and is scheduled to get his second dose next week. Denies chest pain, dyspnea, dizziness, lightheadedness, syncope, near syncope. Denies any other upper or lower GI symptoms.  At the end of the visit noted intermittent hemorrhoids symptoms and requesting topical therapy Rx.  Past Medical History:  Diagnosis Date  . Acute kidney injury ([place])   . Acute respiratory failure with hypoxia ([place])   . Arthritis   . COPD (chronic obstructive pulmonary disease) ([place])   . Depression   . Diabetes mellitus   . Diabetes mellitus without complication ([place])   . Elevated PSA 10/01/2016  . Hypercholesterolemia   . Hyperlipidemia   . Hypertension   . Insomnia 01/05/2016  . Multiple lung nodules on CT 04/02/2015  . Nicotine addiction   . Obesity   . Oxygen deficiency    qhs  . Schizophrenia [facility])     Past Surgical History:  Procedure Laterality Date  . CATARACT EXTRACTION W/PHACO Left 11/20/2013   Procedure: CATARACT EXTRACTION PHACO AND INTRAOCULAR LENS PLACEMENT (IOC);  Surgeon: Tonny Branch, MD;  Location: [facility] ORS;  Service: Ophthalmology;  Laterality: Left;  CDE:10.26  . CATARACT EXTRACTION W/PHACO Right 12/08/2013   Procedure: RIGHT EYE CATARACT EXTRACTION PHACO AND INTRAOCULAR LENS PLACEMENT ;  Surgeon: Tonny Branch, MD;  Location: [facility] ORS;  Service: Ophthalmology;  Laterality: Right;  CDE 12.38  . COLONOSCOPY N/A  04/01/2013   pancolonic diverticulosis, redundant colon, large internal hemorrhoids.   . COLONOSCOPY  2014   INCOMPLETE PREP IN R COLON  . ESOPHAGOGASTRODUODENOSCOPY N/A 02/18/2016   stricture at GE junction, moderate erosive gastritis and mild non-erosive duodenitis. +H.pylori. Treated initially with Prevpac. Breath test to check for eradication was positive, and he was prescribed Pylera.   Marland Kitchen EYE SURGERY Left 11/2013   cataract extraction  . SAVORY DILATION N/A 02/18/2016   Procedure: SAVORY DILATION;  Surgeon: Danie Binder, MD;  Location: [facility] ENDO SUITE;  Service: Endoscopy;  Laterality: N/A;  . SHOULDER SURGERY      Current Outpatient Medications  Medication Sig Dispense Refill  . Accu-Chek Softclix Lancets lancets TEST BLOOD SUGAR ONCE DAILY AS DIRECTED. 100 each 5  . acetaminophen (TYLENOL) 500 MG tablet Take 500-1,000 mg by mouth daily as needed. Takes mostly for HA about 2 x/week.    Marland Kitchen albuterol (VENTOLIN HFA) 108 (90 Base) MCG/ACT inhaler INHALE 2 PUFFS INTO THE LUNGS EVERY 6 HOURS AS NEEDED FOR WHEEZING OR SHORTNESS OF BREATH. 8.5 g 0  . Blood Glucose Monitoring Suppl (ACCU-CHEK AVIVA PLUS) w/Device KIT accucheck aviva meter Once daily testing DX e11.9 1 kit 0  . budesonide-formoterol (SYMBICORT) 80-4.5 MCG/ACT inhaler INHALE 2 PUFFS INTO LUNGS TWICE DAILY. 10.2 g 0  . busPIRone (BUSPAR) 7.5 MG tablet TAKE 1 TABLET BY MOUTH 3 TIMES DAILY. 270 tablet 0  . Docusate Sodium (COLACE PO) Take by mouth daily as needed.    Marland Kitchen GLIPIZIDE XL 5 MG 24 hr tablet TAKE 1 TABLET DAILY WITH BREAKFAST. 90 tablet 3  . glucose blood (ACCU-CHEK AVIVA PLUS) test strip USE TO TEST ONCE DAILY 100 each 5  . ipratropium-albuterol (DUONEB) 0.5-2.5 (3) MG/3ML SOLN USE 1 VIAL BY NEBULIZATION EVERY 4 TO 6 HOURS 1620 mL 1  . olmesartan (BENICAR) 40 MG tablet TAKE 1 TABLET BY MOUTH ONCE A DAY. 90 tablet 0  . pantoprazole (PROTONIX) 40 MG tablet Take 1 tablet (40 mg total) by mouth daily. 30 tablet 2  . pravastatin  (PRAVACHOL) 20 MG tablet TAKE (1) TABLET BY MOUTH AT BEDTIME FOR CHOLESTEROL. 90 tablet 0  . risperiDONE (RISPERDAL) 1 MG tablet TAKE (1) TABLET BY MOUTH AT BEDTIME WITH '4MG'$ . 90 tablet 0  . risperidone (RISPERDAL) 4 MG tablet Takes with a 1 mg tablet at night to = 5 mg dose 90 tablet 1  . traZODone (DESYREL) 50 MG tablet TAKE ONE TABLET BY MOUTH AT BEDTIME AS NEEDED FOR SLEEP. 90 tablet 0  . triamterene-hydrochlorothiazide (MAXZIDE-25) 37.5-25 MG tablet Take 0.5 tablets by mouth daily. 45 tablet 3  . UNABLE TO FIND Nebulizer mouthpiece and tubing x 1  DX COPD 1 each 0  . UNABLE TO FIND XL pullups for daily use DX Incontinence 100 each 11  . UNABLE TO FIND Nebulizer tubing x 1  DX J44.9 1 each 0   No current facility-administered medications for this visit.    Allergies as of 03/18/2020 - Review Complete 03/18/2020  Allergen Reaction Noted  . Sertraline hcl  04/17/2011  . Wellbutrin [bupropion] Other (See Comments) 07/29/2015    Family History  Problem Relation Age of Onset  .  Diabetes Mother   . Hypertension Mother   . Stroke Mother   . Diabetes Sister   . Heart disease Sister   . Kidney disease Father   . Drug abuse Brother   . Colon cancer Neg Hx   . Colon polyps Neg Hx     Social History   Socioeconomic History  . Marital status: Divorced    Spouse name: Not on file  . Number of children: 2  . Years of education: Not on file  . Highest education level: Not on file  Occupational History  . Occupation: retired from [employer]  . Smoking status: Current Every Day Smoker    Packs/day: 1.00    Years: 48.00    Pack years: 48.00    Types: Cigarettes    Last attempt to quit: 05/02/2017    Years since quitting: 2.8  . Smokeless tobacco: Never Used  Substance and Sexual Activity  . Alcohol use: Not Currently    Alcohol/week: 0.0 standard drinks  . Drug use: No  . Sexual activity: Not Currently    Birth control/protection: None  Other Topics Concern  . Not  on file  Social History Narrative   ** Merged History Encounter **       ** Merged History Encounter **       Social Determinants of Health   Financial Resource Strain:   . Difficulty of Paying Living Expenses:   Food Insecurity:   . Worried About Charity fundraiser in the Last Year:   . Arboriculturist in the Last Year:   Transportation Needs:   . Film/video editor (Medical):   Marland Kitchen Lack of Transportation (Non-Medical):   Physical Activity:   . Days of Exercise per Week:   . Minutes of Exercise per Session:   Stress:   . Feeling of Stress :   Social Connections:   . Frequency of Communication with Friends and Family:   . Frequency of Social Gatherings with Friends and Family:   . Attends Religious Services:   . Active Member of Clubs or Organizations:   . Attends Archivist Meetings:   Marland Kitchen Marital Status:     Subjective: Review of Systems  Constitutional: Negative for chills, fever, malaise/fatigue and weight loss.  HENT: Negative for congestion and sore throat.   Respiratory: Negative for cough and shortness of breath.   Cardiovascular: Negative for chest pain and palpitations.  Gastrointestinal: Positive for abdominal pain and nausea. Negative for blood in stool, diarrhea, melena and vomiting.       Pasty stools  Musculoskeletal: Positive for back pain. Negative for myalgias.  Skin: Negative for rash.  Neurological: Negative for dizziness and weakness.  Endo/Heme/Allergies: Does not bruise/bleed easily.  Psychiatric/Behavioral: Negative for depression. The patient is not nervous/anxious.   All other systems reviewed and are negative.    Objective: BP 136/89   Pulse (!) 103   Temp 98.2 F (36.8 C) (Oral)   Ht '6\' 1"'$  (1.854 m)   Wt 239 lb 12.8 oz (108.8 kg)   BMI 31.64 kg/m  Physical Exam Vitals and nursing note reviewed.  Constitutional:      General: He is not in acute distress.    Appearance: Normal appearance. He is well-developed. He is  obese. He is not ill-appearing, toxic-appearing or diaphoretic.  HENT:     Head: Normocephalic and atraumatic.     Nose: No congestion or rhinorrhea.  Eyes:     General:  No scleral icterus. Cardiovascular:     Rate and Rhythm: Normal rate and regular rhythm.     Heart sounds: Normal heart sounds.  Pulmonary:     Effort: Pulmonary effort is normal.     Breath sounds: Normal breath sounds.  Abdominal:     General: Abdomen is protuberant. Bowel sounds are normal. There is no distension.     Palpations: Abdomen is soft. There is no hepatomegaly, splenomegaly or mass.     Tenderness: There is generalized abdominal tenderness. There is no guarding or rebound.     Hernia: No hernia is present.  Musculoskeletal:     Cervical back: Neck supple.  Skin:    General: Skin is warm and dry.     Coloration: Skin is not jaundiced.     Findings: No bruising or rash.  Neurological:     General: No focal deficit present.     Mental Status: He is alert and oriented to person, place, and time. Mental status is at baseline.  Psychiatric:        Mood and Affect: Mood normal. Mood is not anxious or depressed.        Behavior: Behavior normal.        Thought Content: Thought content normal.       03/18/2020 8:30 AM   Disclaimer: This note was dictated with voice recognition software. Similar sounding words can inadvertently be transcribed and may not be corrected upon review.

## 2020-03-18 NOTE — Assessment & Plan Note (Addendum)
Hospitalized with lower GI bleed of unknown etiology, question ischemic let us.  Recommended outpatient colonoscopy for which a slot is been held.  We will proceed with booking of his colonoscopy today.  I will also check a CBC, ferritin, iron to update his labs.  Follow-up in 3 months.  Proceed with colonoscopy either with propofol or preprocedure Phenergan with Dr. Oneida Alar in the near future. The risks, benefits, and alternatives have been discussed in detail with the patient. They state understanding and desire to proceed.   The patient is currently on trazodone. The patient is not on any other anticoagulants, anxiolytics, chronic pain medications, antidepressants, antidiabetics, or iron supplements.  We will discuss with Dr. Oneida Alar need any need for propofol versus conscious sedation with Phenergan.

## 2020-03-18 NOTE — Telephone Encounter (Signed)
Patient seen in office this morning. He brought medications in bag. When his triamterene-HCTZ was pulled out the lid was not closed on the bottle and the tablets fell out onto the floor.  I called [facility] and spoke with Nira Conn in the pharmacy. She is going to speak with Vaughan Basta who handles insurance claims to see if they can file a loss of medication due to spill. She will have Vaughan Basta call me back with an update.   Patient aware I will call him once I know an update.

## 2020-03-18 NOTE — Patient Instructions (Signed)
Your health issues we discussed today were:   Previous GI bleed and abdominal pain: 1. Have your labs drawn when you are able to 2. We will schedule your colonoscopy for you 3. Further recommendations will be made after colonoscopy 4. Return for follow-up in 3 months  GERD (reflux/heartburn): 1. Continue taking Protonix 40 mg daily 2. Call us if you have any worsening reflux or GERD symptoms  Hemorrhoids: 1. I have sent a prescription for Anusol rectal cream to your pharmacy 2. You can use this on your hemorrhoids for up to twice a day, up to 10 days at a time 3. Call us for any worsening or severe problems such as bleeding  Overall I recommend:  1. Continue your other current medications 2. Return for follow-up in 3 months 3. Call us for any questions or concerns.   ---------------------------------------------------------------  I am glad you got your first coronavirus vaccine!  Keep your appointment for next week for your second dose.  Even after you are vaccinated continue to wear a mask, socially distance from people you do not live with who are unvaccinated, wash your hands regularly  ---------------------------------------------------------------   At [facility] we value your feedback. You may receive a survey about your visit today. Please share your experience as we strive to create trusting relationships with our patients to provide genuine, compassionate, quality care.  We appreciate your understanding and patience as we review any laboratory studies, imaging, and other diagnostic tests that are ordered as we care for you. Our office policy is 5 business days for review of these results, and any emergent or urgent results are addressed in a timely manner for your best interest. If you do not hear from our office in 1 week, please contact us.   We also encourage the use of MyChart, which contains your medical information for your review as well. If you are  not enrolled in this feature, an access code is on this after visit summary for your convenience. Thank you for allowing [place] to be involved in your care.  It was great to see you today!  I hope you have a great day!!

## 2020-03-18 NOTE — Assessment & Plan Note (Signed)
Notes hemorrhoid symptoms and requesting therapy.  I will send in a prescription for Anusol rectal cream that he can use up to twice a day for up to 10 days at a time.  Continue to monitor notify us of any worsening.  Follow-up in 3 months.

## 2020-03-18 NOTE — Assessment & Plan Note (Signed)
GERD well managed on PPI.  Notes dark stools but not "pitch black".  Doubt upper GI process resulting in melena.  Continue to monitor.  I will recheck hemoglobin today due to previous GI bleed.  Follow-up in 3 months.

## 2020-03-18 NOTE — Assessment & Plan Note (Signed)
Mild persistent abdominal discomfort but improved.  Pain is intermittent and not severe.  Query diverticular versus previous suspicion of possible ischemic colitis.  Colonoscopy scheduled for upcoming.  Continue current medications and follow-up in 3 months.

## 2020-03-18 NOTE — Progress Notes (Signed)
Cc'ed to pcp °

## 2020-03-18 NOTE — Telephone Encounter (Signed)
Spoke with Colletta Maryland from [employer]. She states patient is being delivered medication today and no charge for pt.   Called pt and he states he just received it.

## 2020-03-18 NOTE — Telephone Encounter (Signed)
Called pt, TCS w/SLF scheduled for 04/01/20 at 8:45am. COVID test 03/30/20 at 2:15pm. Orders entered. Pt picked up appt letter and procedure instructions.

## 2020-03-19 DIAGNOSIS — K922 Gastrointestinal hemorrhage, unspecified: Secondary | ICD-10-CM | POA: Diagnosis not present

## 2020-03-19 DIAGNOSIS — K649 Unspecified hemorrhoids: Secondary | ICD-10-CM | POA: Diagnosis not present

## 2020-03-19 DIAGNOSIS — R1084 Generalized abdominal pain: Secondary | ICD-10-CM | POA: Diagnosis not present

## 2020-03-19 DIAGNOSIS — K219 Gastro-esophageal reflux disease without esophagitis: Secondary | ICD-10-CM | POA: Diagnosis not present

## 2020-03-20 LAB — CBC WITH DIFFERENTIAL/PLATELET
Absolute Monocytes: 529 cells/uL (ref 200–950)
Basophils Absolute: 40 cells/uL (ref 0–200)
Basophils Relative: 0.5 %
Eosinophils Absolute: 1785 cells/uL — ABNORMAL HIGH (ref 15–500)
Eosinophils Relative: 22.6 %
HCT: 37.8 % — ABNORMAL LOW (ref 38.5–50.0)
Hemoglobin: 12.6 g/dL — ABNORMAL LOW (ref 13.2–17.1)
Lymphs Abs: 2015 cells/uL (ref 850–3900)
MCH: 28.9 pg (ref 27.0–33.0)
MCHC: 33.3 g/dL (ref 32.0–36.0)
MCV: 86.7 fL (ref 80.0–100.0)
MPV: 10.5 fL (ref 7.5–12.5)
Monocytes Relative: 6.7 %
Neutro Abs: 3531 cells/uL (ref 1500–7800)
Neutrophils Relative %: 44.7 %
Platelets: 262 10*3/uL (ref 140–400)
RBC: 4.36 10*6/uL (ref 4.20–5.80)
RDW: 13 % (ref 11.0–15.0)
Total Lymphocyte: 25.5 %
WBC: 7.9 10*3/uL (ref 3.8–10.8)

## 2020-03-20 LAB — IRON: Iron: 60 ug/dL (ref 50–180)

## 2020-03-20 LAB — FERRITIN: Ferritin: 62 ng/mL (ref 24–380)

## 2020-03-26 ENCOUNTER — Encounter: Payer: Self-pay | Admitting: Podiatry

## 2020-03-26 ENCOUNTER — Ambulatory Visit (INDEPENDENT_AMBULATORY_CARE_PROVIDER_SITE_OTHER): Payer: Medicare Other | Admitting: Podiatry

## 2020-03-26 ENCOUNTER — Other Ambulatory Visit: Payer: Self-pay

## 2020-03-26 VITALS — Temp 97.5°F

## 2020-03-26 DIAGNOSIS — E1149 Type 2 diabetes mellitus with other diabetic neurological complication: Secondary | ICD-10-CM

## 2020-03-26 DIAGNOSIS — M79676 Pain in unspecified toe(s): Secondary | ICD-10-CM

## 2020-03-26 DIAGNOSIS — B351 Tinea unguium: Secondary | ICD-10-CM | POA: Diagnosis not present

## 2020-03-26 DIAGNOSIS — Q828 Other specified congenital malformations of skin: Secondary | ICD-10-CM

## 2020-03-26 DIAGNOSIS — E1140 Type 2 diabetes mellitus with diabetic neuropathy, unspecified: Secondary | ICD-10-CM | POA: Diagnosis not present

## 2020-03-26 NOTE — Progress Notes (Signed)
This patient returns to my office for at risk foot care.  This patient requires this care by a professional since this patient will be at risk due to having diabetes mellitus and chronic kidney disease.  Patient has pain in the two calluses on the bottom outside of his feet.  This patient is unable to cut nails himself since the patient cannot reach his nails.These nails are painful walking and wearing shoes. He cannot treat the painful calluses himself.  This patient presents for at risk foot care today.  General Appearance  Alert, conversant and in no acute stress.  Vascular  Dorsalis pedis and posterior tibial  pulses are palpable  bilaterally.  Capillary return is within normal limits  bilaterally. Temperature is within normal limits  bilaterally.  Neurologic  Senn-Weinstein monofilament wire test within normal limits  bilaterally. Muscle power within normal limits bilaterally.  Nails Thick disfigured discolored nails with subungual debris  from hallux to fifth toes bilaterally. No evidence of bacterial infection or drainage bilaterally.  Orthopedic  No limitations of motion  feet .  No crepitus or effusions noted.  No bony pathology or digital deformities noted.  Skin  normotropic skin  noted bilaterally.  No signs of infections or ulcers noted.   Porokeratosis sub 5th metabase  B/L.  Onychomycosis  Pain in right toes  Pain in left toes  Porokeratosis  X 2.  Consent was obtained for treatment procedures.   Mechanical debridement of nails 1-5  bilaterally performed with a nail nipper.  Filed with dremel without incident. Debridement of porokeratosis  with # 15 blade.   Return office visit   3 months                  Told patient to return for periodic foot care and evaluation due to potential at risk complications.   Gardiner Barefoot DPM

## 2020-03-27 ENCOUNTER — Other Ambulatory Visit: Payer: Self-pay | Admitting: Nurse Practitioner

## 2020-03-27 DIAGNOSIS — K625 Hemorrhage of anus and rectum: Secondary | ICD-10-CM

## 2020-03-29 ENCOUNTER — Telehealth: Payer: Self-pay | Admitting: *Deleted

## 2020-03-29 NOTE — Telephone Encounter (Signed)
Received VM from patient asking what he needs to do for "clear liquids".  Called patient and discussed clear liquids with him. He found the list of clear liquids on his instructions second page. He needed nothing further.

## 2020-03-30 ENCOUNTER — Other Ambulatory Visit: Payer: Self-pay

## 2020-03-30 ENCOUNTER — Other Ambulatory Visit (HOSPITAL_COMMUNITY)
Admission: RE | Admit: 2020-03-30 | Discharge: 2020-03-30 | Disposition: A | Discharge status: Home / Self Care | Payer: Medicare Other | Source: Ambulatory Visit | Attending: Gastroenterology | Admitting: Gastroenterology

## 2020-03-30 DIAGNOSIS — Z20822 Contact with and (suspected) exposure to covid-19: Secondary | ICD-10-CM | POA: Diagnosis not present

## 2020-03-30 DIAGNOSIS — Z01812 Encounter for preprocedural laboratory examination: Secondary | ICD-10-CM | POA: Diagnosis not present

## 2020-03-31 ENCOUNTER — Telehealth: Payer: Self-pay | Admitting: *Deleted

## 2020-03-31 ENCOUNTER — Other Ambulatory Visit (HOSPITAL_COMMUNITY)
Admission: RE | Admit: 2020-03-31 | Discharge: 2020-03-31 | Disposition: A | Discharge status: Home / Self Care | Payer: Medicare Other | Source: Ambulatory Visit | Attending: Gastroenterology | Admitting: Gastroenterology

## 2020-03-31 LAB — SARS CORONAVIRUS 2 (TAT 6-24 HRS): SARS Coronavirus 2: NEGATIVE

## 2020-03-31 NOTE — Telephone Encounter (Signed)
Received call from melanie in endo. Patient covid test results was invalid and will have to be retested for procedure that is scheduled tomorrow. Patient will have to go to short stay to be swabbed again.  Called pt and made aware. He is not sure if he can go today. Patient aware it will have to be done today in order for procedure to be done tomorrow. He is going to call back. Endo placed him on scheduled for testing at 12:45pm until we hear back from patient.

## 2020-03-31 NOTE — Telephone Encounter (Signed)
CM spoke with lab. Patient has a neg result. Called patient and he is aware of results and procedure is still scheduled for tomorrow.  CM has already made melanie in endo aware.

## 2020-04-01 ENCOUNTER — Encounter (HOSPITAL_COMMUNITY): Payer: Self-pay | Admitting: Gastroenterology

## 2020-04-01 ENCOUNTER — Other Ambulatory Visit: Payer: Self-pay

## 2020-04-01 ENCOUNTER — Ambulatory Visit (HOSPITAL_COMMUNITY)
Admission: RE | Admit: 2020-04-01 | Discharge: 2020-04-01 | Disposition: A | Discharge status: Home / Self Care | Payer: Medicare Other | Attending: Gastroenterology | Admitting: Gastroenterology

## 2020-04-01 ENCOUNTER — Encounter (HOSPITAL_COMMUNITY)
Admission: RE | Disposition: A | Discharge status: Home / Self Care | Payer: Self-pay | Source: Home / Self Care | Attending: Gastroenterology

## 2020-04-01 DIAGNOSIS — K5730 Diverticulosis of large intestine without perforation or abscess without bleeding: Secondary | ICD-10-CM | POA: Diagnosis not present

## 2020-04-01 DIAGNOSIS — E119 Type 2 diabetes mellitus without complications: Secondary | ICD-10-CM | POA: Diagnosis not present

## 2020-04-01 DIAGNOSIS — D122 Benign neoplasm of ascending colon: Secondary | ICD-10-CM | POA: Insufficient documentation

## 2020-04-01 DIAGNOSIS — D120 Benign neoplasm of cecum: Secondary | ICD-10-CM | POA: Diagnosis not present

## 2020-04-01 DIAGNOSIS — E669 Obesity, unspecified: Secondary | ICD-10-CM | POA: Insufficient documentation

## 2020-04-01 DIAGNOSIS — Z6831 Body mass index (BMI) 31.0-31.9, adult: Secondary | ICD-10-CM | POA: Diagnosis not present

## 2020-04-01 DIAGNOSIS — E785 Hyperlipidemia, unspecified: Secondary | ICD-10-CM | POA: Insufficient documentation

## 2020-04-01 DIAGNOSIS — R933 Abnormal findings on diagnostic imaging of other parts of digestive tract: Secondary | ICD-10-CM | POA: Diagnosis not present

## 2020-04-01 DIAGNOSIS — J449 Chronic obstructive pulmonary disease, unspecified: Secondary | ICD-10-CM | POA: Insufficient documentation

## 2020-04-01 DIAGNOSIS — K644 Residual hemorrhoidal skin tags: Secondary | ICD-10-CM | POA: Insufficient documentation

## 2020-04-01 DIAGNOSIS — F209 Schizophrenia, unspecified: Secondary | ICD-10-CM | POA: Insufficient documentation

## 2020-04-01 DIAGNOSIS — F17210 Nicotine dependence, cigarettes, uncomplicated: Secondary | ICD-10-CM | POA: Insufficient documentation

## 2020-04-01 DIAGNOSIS — Z7951 Long term (current) use of inhaled steroids: Secondary | ICD-10-CM | POA: Diagnosis not present

## 2020-04-01 DIAGNOSIS — Q438 Other specified congenital malformations of intestine: Secondary | ICD-10-CM | POA: Diagnosis not present

## 2020-04-01 DIAGNOSIS — K648 Other hemorrhoids: Secondary | ICD-10-CM | POA: Insufficient documentation

## 2020-04-01 DIAGNOSIS — E7800 Pure hypercholesterolemia, unspecified: Secondary | ICD-10-CM | POA: Insufficient documentation

## 2020-04-01 DIAGNOSIS — Z7984 Long term (current) use of oral hypoglycemic drugs: Secondary | ICD-10-CM | POA: Diagnosis not present

## 2020-04-01 DIAGNOSIS — G4700 Insomnia, unspecified: Secondary | ICD-10-CM | POA: Insufficient documentation

## 2020-04-01 DIAGNOSIS — I10 Essential (primary) hypertension: Secondary | ICD-10-CM | POA: Diagnosis not present

## 2020-04-01 DIAGNOSIS — D123 Benign neoplasm of transverse colon: Secondary | ICD-10-CM | POA: Insufficient documentation

## 2020-04-01 DIAGNOSIS — K921 Melena: Secondary | ICD-10-CM | POA: Diagnosis not present

## 2020-04-01 DIAGNOSIS — K635 Polyp of colon: Secondary | ICD-10-CM

## 2020-04-01 HISTORY — PX: POLYPECTOMY: SHX5525

## 2020-04-01 HISTORY — PX: COLONOSCOPY: SHX5424

## 2020-04-01 LAB — GLUCOSE, CAPILLARY: Glucose-Capillary: 165 mg/dL — ABNORMAL HIGH (ref 70–99)

## 2020-04-01 SURGERY — COLONOSCOPY
Anesthesia: Moderate Sedation

## 2020-04-01 MED ORDER — MIDAZOLAM HCL 5 MG/5ML IJ SOLN
INTRAMUSCULAR | Status: DC | PRN
  Administered 2020-04-01 (×2): 2 mg via INTRAVENOUS
  Administered 2020-04-01: 1 mg via INTRAVENOUS

## 2020-04-01 MED ORDER — STERILE WATER FOR IRRIGATION IR SOLN
Status: DC | PRN
  Administered 2020-04-01: 1.5 mL

## 2020-04-01 MED ORDER — MIDAZOLAM HCL 5 MG/5ML IJ SOLN
INTRAMUSCULAR | Status: AC
  Filled 2020-04-01: qty 10

## 2020-04-01 MED ORDER — SODIUM CHLORIDE 0.9 % IV SOLN: INTRAVENOUS | Status: DC

## 2020-04-01 MED ORDER — MEPERIDINE HCL 100 MG/ML IJ SOLN
INTRAMUSCULAR | Status: DC | PRN
  Administered 2020-04-01: 50 mg via INTRAVENOUS
  Administered 2020-04-01: 25 mg via INTRAVENOUS

## 2020-04-01 MED ORDER — SPOT INK MARKER SYRINGE KIT
PACK | SUBMUCOSAL | Status: AC
  Filled 2020-04-01: qty 5

## 2020-04-01 MED ORDER — MEPERIDINE HCL 100 MG/ML IJ SOLN
INTRAMUSCULAR | Status: AC
  Filled 2020-04-01: qty 2

## 2020-04-01 NOTE — Op Note (Signed)
[facility] Patient Name: Matthew Molina Procedure Date: 04/01/2020 8:54 AM MRN: [id number] Date of Birth: 01-31-1951 Attending MD: Barney Drain MD, MD CSN: [id number] Age: 69 Admit Type: Outpatient Procedure:                Colonoscopy-EMR/SUBMUCOSAL INJECTION/CLIPS                            x5/COLD-CAUTERY SNARE POLYPECTOMY Indications:              Hematochezia, Abnormal CT of the GI tract Providers:                Barney Drain MD, MD, Otis Peak B. Sharon Seller, RN, Aram Candela Referring MD:             Norwood Levo. Simpson MD, MD Medicines:                Meperidine 75 mg IV, Midazolam 5 mg IV Complications:            No immediate complications. Estimated Blood Loss:     Estimated blood loss was minimal. Procedure:                Pre-Anesthesia Assessment:                           - Prior to the procedure, a History and Physical                            was performed, and patient medications and                            allergies were reviewed. The patient's tolerance of                            previous anesthesia was also reviewed. The risks                            and benefits of the procedure and the sedation                            options and risks were discussed with the patient.                            All questions were answered, and informed consent                            was obtained. Prior Anticoagulants: The patient has                            taken no previous anticoagulant or antiplatelet                            agents. ASA Grade Assessment: III - A patient with  severe systemic disease. After reviewing the risks                            and benefits, the patient was deemed in                            satisfactory condition to undergo the procedure.                            After obtaining informed consent, the colonoscope                            was passed under direct vision.  Throughout the                            procedure, the patient's blood pressure, pulse, and                            oxygen saturations were monitored continuously. The                            [id number] [id number] scope was introduced through                            the anus and advanced to the the cecum, identified                            by appendiceal orifice and ileocecal valve. The                            colonoscopy was technically difficult and complex                            due to significant looping. Successful completion                            of the procedure was aided by straightening and                            shortening the scope to obtain bowel loop reduction                            and COLOWRAP. The patient tolerated the procedure                            fairly well. The quality of the bowel preparation                            was good. The ileocecal valve, appendiceal orifice,                            and rectum were photographed. Scope In: 9:18:29 AM Scope Out: 10:18:15 AM Scope Withdrawal Time: 0 hours 56 minutes 28 seconds  Total Procedure Duration: 0 hours 59 minutes 46 seconds  Findings:      A 15 mm polyp was found in the cecum. The polyp was sessile. Area was       successfully injected with 2 mL Eleview for a lift polypectomy.       Preparations were made for mucosal resection. Eleview was injected to       raise the lesion. Snare mucosal resection was performed. Resection and       retrieval were complete. To prevent bleeding after the polypectomy, two       hemostatic clips were successfully placed (MR conditional). There was no       bleeding at the end of the procedure.      A 40 mm polyp was found in the hepatic flexure. The polyp was       carpet-like and flat. Area was successfully injected with 3 mL Eleview       for a lift polypectomy. Preparations were made for mucosal resection.       Eleview was injected to raise  the lesion. Snare mucosal resection was       performed. Resection and retrieval were complete. To prevent bleeding       after the polypectomy, three hemostatic clips were successfully placed       (MR conditional). There was no bleeding at the end of the procedure.       Area was tattooed with an injection of 1 mL of Spot (carbon black).      Multiple small and large-mouthed diverticula were found in the entire       colon.      External and internal hemorrhoids were found.      The recto-sigmoid colon, sigmoid colon and descending colon were mildly       tortuous.      Three sessile polyps were found in the transverse colon, hepatic flexure       and ascending colon. The polyps were 2 to 5 mm in size. These polyps       were removed with a cold snare. Resection and retrieval were complete.      A 6 mm polyp was found in the distal transverse colon. The polyp was       sessile. The polyp was removed with a hot snare. Resection and retrieval       were complete. Impression:               - SEVEN COLON POLYPS REMOVED                           - Diverticulosis in the entire examined colon.                           - External and internal hemorrhoids.                           - Tortuous colon.                           . Moderate Sedation:      Moderate (conscious) sedation was administered by the endoscopy nurse       and supervised by the endoscopist. The following parameters were       monitored: oxygen saturation, heart rate, blood pressure, and response  to care. Total physician intraservice time was 68 minutes. Recommendation:           - Patient has a contact number available for                            emergencies. The signs and symptoms of potential                            delayed complications were discussed with the                            patient. Return to normal activities tomorrow.                            Written discharge instructions were provided to  the                            patient.                           - High fiber diet.                           - Continue present medications.                           - Await pathology results.                           - Repeat colonoscopy date to be determined after                            pending pathology results are reviewed for                            surveillance. Procedure Code(s):        --- Professional ---                           [phone], Colonoscopy, flexible; with endoscopic                            mucosal resection                           45385, 61, Colonoscopy, flexible; with removal of                            tumor(s), polyp(s), or other lesion(s) by snare                            technique                           99153, Moderate sedation; each additional 15  minutes intraservice time                           99153, Moderate sedation; each additional 15                            minutes intraservice time                           99153, Moderate sedation; each additional 15                            minutes intraservice time                           99153, Moderate sedation; each additional 15                            minutes intraservice time                           G0500, Moderate sedation services provided by the                            same physician or other qualified health care                            professional performing a gastrointestinal                            endoscopic service that sedation supports,                            requiring the presence of an independent trained                            observer to assist in the monitoring of the                            patient's level of consciousness and physiological                            status; initial 15 minutes of intra-service time;                            patient age 40 years or older (additional time may                             be reported with [phone], as appropriate) Diagnosis Code(s):        --- Professional ---                           K63.5, Polyp of colon                           K64.8, Other hemorrhoids  K92.1, Melena (includes Hematochezia)                           K57.30, Diverticulosis of large intestine without                            perforation or abscess without bleeding                           R93.3, Abnormal findings on diagnostic imaging of                            other parts of digestive tract                           Q43.8, Other specified congenital malformations of                            intestine CPT copyright 2019 [employer]. All rights reserved. The codes documented in this report are preliminary and upon coder review may  be revised to meet current compliance requirements. Barney Drain, MD Barney Drain MD, MD 04/01/2020 10:47:58 AM This report has been signed electronically. Number of Addenda: 0

## 2020-04-01 NOTE — Discharge Instructions (Signed)
You have small internal hemorrhoids and diverticulosis IN YOUR LEFT AND RIGHT COLON. YOU HAD SEVEN POLYP REMOVED. ONE WAS LARGER AND I TATTOOED THE BASE in the right colon. I PLACED FIVE CLIPS TO PREVENT BLEEDING IN 7-10 DAYS.    IF YOU NEED AN MRI, LET THE RADIOLOGY TECH KNOW THAT YOU HAD TWO CLIPS PLACED IN YOUR COLON. THEY SHOULD FALL OFF IN 30 DAYS.  DRINK WATER TO KEEP YOUR URINE LIGHT YELLOW.  CONTINUE YOUR WEIGHT LOSS EFFORTS. YOUR BODY MASS INDEX IS OVER 30 WHICH MEANS YOU ARE OBESE. OBESITY IS ASSOCIATED WITH AN INCREASE FOR ALL CANCERS, INCLUDING ESOPHAGEAL AND COLON CANCER.  FOLLOW A HIGH FIBER DIET. AVOID ITEMS THAT CAUSE BLOATING. See info below.   USE PREPARATION H FOUR TIMES  A DAY IF NEEDED TO RELIEVE RECTAL PAIN/PRESSURE/BLEEDING.   YOUR BIOPSY RESULTS WILL BE BACK IN 5 BUSINESS DAYS.  YOUR next colonoscopy WILL BE SCHEDULED BASED ON YOUR FINAL PATHOLOGY REPORT.   Colonoscopy Care After Read the instructions outlined below and refer to this sheet in the next week. These discharge instructions provide you with general information on caring for yourself after you leave the hospital. While your treatment has been planned according to the most current medical practices available, unavoidable complications occasionally occur. If you have any problems or questions after discharge, call DR. Konya Fauble, [phone].  ACTIVITY  You may resume your regular activity, but move at a slower pace for the next 24 hours.   Take frequent rest periods for the next 24 hours.   Walking will help get rid of the air and reduce the bloated feeling in your belly (abdomen).   No driving for 24 hours (because of the medicine (anesthesia) used during the test).   You may shower.   Do not sign any important legal documents or operate any machinery for 24 hours (because of the anesthesia used during the test).    NUTRITION  Drink plenty of fluids.   You may resume your normal diet as  instructed by your doctor.   Begin with a light meal and progress to your normal diet. Heavy or fried foods are harder to digest and may make you feel sick to your stomach (nauseated).   Avoid alcoholic beverages for 24 hours or as instructed.    MEDICATIONS  You may resume your normal medications.   WHAT YOU CAN EXPECT TODAY  Some feelings of bloating in the abdomen.   Passage of more gas than usual.   Spotting of blood in your stool or on the toilet paper  .  IF YOU HAD POLYPS REMOVED DURING THE COLONOSCOPY:  Eat a soft diet IF YOU HAVE NAUSEA, BLOATING, ABDOMINAL PAIN, OR VOMITING.    FINDING OUT THE RESULTS OF YOUR TEST Not all test results are available during your visit. DR. Oneida Alar WILL CALL YOU WITHIN 14 DAYS OF YOUR PROCEDUE WITH YOUR RESULTS. Do not assume everything is normal if you have not heard from DR. Suraj Ramdass, CALL HER OFFICE AT [phone].  SEEK IMMEDIATE MEDICAL ATTENTION AND CALL THE OFFICE: [phone] IF:  You have more than a spotting of blood in your stool.   Your belly is swollen (abdominal distention).   You are nauseated or vomiting.   You have a temperature over 101F.   You have abdominal pain or discomfort that is severe or gets worse throughout the day.  High-Fiber Diet A high-fiber diet changes your normal diet to include more whole grains, legumes, fruits, and vegetables. Changes in  the diet involve replacing refined carbohydrates with unrefined foods. The calorie level of the diet is essentially unchanged. The Dietary Reference Intake (recommended amount) for adult males is 38 grams per day. For adult females, it is 25 grams per day. Pregnant and lactating women should consume 28 grams of fiber per day. Fiber is the intact part of a plant that is not broken down during digestion. Functional fiber is fiber that has been isolated from the plant to provide a beneficial effect in the body.  PURPOSE Increase stool bulk.  Ease and regulate  bowel movements.  Lower cholesterol.  REDUCE RISK OF COLON CANCER  INDICATIONS THAT YOU NEED MORE FIBER Constipation and hemorrhoids.  Uncomplicated diverticulosis (intestine condition) and irritable bowel syndrome.  Weight management.  As a protective measure against hardening of the arteries (atherosclerosis), diabetes, and cancer.   GUIDELINES FOR INCREASING FIBER IN THE DIET Start adding fiber to the diet slowly. A gradual increase of about 5 more grams (2 servings of most fruits or vegetables) per day is best. Too rapid an increase in fiber may result in constipation, flatulence, and bloating.  Drink enough water and fluids to keep your urine clear or pale yellow. Water, juice, or caffeine-free drinks are recommended. Not drinking enough fluid may cause constipation.  Eat a variety of high-fiber foods rather than one type of fiber.  Try to increase your intake of fiber through using high-fiber foods rather than fiber pills or supplements that contain small amounts of fiber.  The goal is to change the types of food eaten. Do not supplement your present diet with high-fiber foods, but replace foods in your present diet.    Polyps, Colon  A polyp is extra tissue that grows inside your body. Colon polyps grow in the large intestine. The large intestine, also called the colon, is part of your digestive system. It is a long, hollow tube at the end of your digestive tract where your body makes and stores stool. Most polyps are not dangerous. They are benign. This means they are not cancerous. But over time, some types of polyps can turn into cancer. Polyps that are smaller than a pea are usually not harmful. But larger polyps could someday become or may already be cancerous. To be safe, doctors remove all polyps and test them.   PREVENTION There is not one sure way to prevent polyps. You might be able to lower your risk of getting them if you:  Eat more fruits and vegetables and less fatty  food.   Do not smoke.   Avoid alcohol.   Exercise every day.   Lose weight if you are overweight.   Eating more calcium and folate can also lower your risk of getting polyps. Some foods that are rich in calcium are milk, cheese, and broccoli. Some foods that are rich in folate are chickpeas, kidney beans, and spinach.    Diverticulosis Diverticulosis is a common condition that develops when small pouches (diverticula) form in the wall of the colon. The risk of diverticulosis increases with age. It happens more often in people who eat a low-fiber diet. Most individuals with diverticulosis have no symptoms. Those individuals with symptoms usually experience belly (abdominal) pain, constipation, or loose stools (diarrhea).  HOME CARE INSTRUCTIONS  Increase the amount of fiber in your diet as directed by your caregiver or dietician. This may reduce symptoms of diverticulosis.   Drink at least 6 to 8 glasses of water each day to prevent constipation.  Try not to strain when you have a bowel movement.   Avoiding nuts and seeds to prevent complications is NOT NECESSARY.   FOODS HAVING HIGH FIBER CONTENT INCLUDE:  Fruits. Apple, peach, pear, tangerine, raisins, prunes.   Vegetables. Brussels sprouts, asparagus, broccoli, cabbage, carrot, cauliflower, romaine lettuce, spinach, summer squash, tomato, winter squash, zucchini.   Starchy Vegetables. Baked beans, kidney beans, lima beans, split peas, lentils, potatoes (with skin).   SEEK IMMEDIATE MEDICAL CARE IF:  You develop increasing pain or severe bloating.   You have an oral temperature above 101F.   You develop vomiting or bowel movements that are bloody or black.

## 2020-04-01 NOTE — H&P (Signed)
Primary Care Physician:  Fayrene Helper, MD Primary Gastroenterologist:  Dr. Oneida Alar  Pre-Procedure History & Physical: HPI:  Matthew Molina is a 69 y.o. male here for BRBPR/abnormal CT.  Past Medical History:  Diagnosis Date  . Acute kidney injury ([place])   . Acute respiratory failure with hypoxia ([place])   . Arthritis   . COPD (chronic obstructive pulmonary disease) ([place])   . Depression   . Diabetes mellitus   . Diabetes mellitus without complication ([place])   . Elevated PSA 10/01/2016  . Hypercholesterolemia   . Hyperlipidemia   . Hypertension   . Insomnia 01/05/2016  . Multiple lung nodules on CT 04/02/2015  . Nicotine addiction   . Obesity   . Oxygen deficiency    qhs  . Schizophrenia [facility])     Past Surgical History:  Procedure Laterality Date  . CATARACT EXTRACTION W/PHACO Left 11/20/2013   Procedure: CATARACT EXTRACTION PHACO AND INTRAOCULAR LENS PLACEMENT (IOC);  Surgeon: Tonny Branch, MD;  Location: [facility] ORS;  Service: Ophthalmology;  Laterality: Left;  CDE:10.26  . CATARACT EXTRACTION W/PHACO Right 12/08/2013   Procedure: RIGHT EYE CATARACT EXTRACTION PHACO AND INTRAOCULAR LENS PLACEMENT ;  Surgeon: Tonny Branch, MD;  Location: [facility] ORS;  Service: Ophthalmology;  Laterality: Right;  CDE 12.38  . COLONOSCOPY N/A 04/01/2013   pancolonic diverticulosis, redundant colon, large internal hemorrhoids.   . COLONOSCOPY  2014   INCOMPLETE PREP IN R COLON  . ESOPHAGOGASTRODUODENOSCOPY N/A 02/18/2016   stricture at GE junction, moderate erosive gastritis and mild non-erosive duodenitis. +H.pylori. Treated initially with Prevpac. Breath test to check for eradication was positive, and he was prescribed Pylera.   Marland Kitchen EYE SURGERY Left 11/2013   cataract extraction  . SAVORY DILATION N/A 02/18/2016   Procedure: SAVORY DILATION;  Surgeon: Danie Binder, MD;  Location: [facility] ENDO SU[facility];  Service: Endoscopy;  Laterality: N/A;  . SHOULDER SURGERY      Prior to Admission medications   Medication Sig  Start Date End Date Taking? Authorizing Provider  acetaminophen (TYLENOL) 500 MG tablet Take 500-1,000 mg by mouth daily as needed for headache. Takes mostly for HA about 2 x/week.    Yes [provider]  albuterol (VENTOLIN HFA) 108 (90 Base) MCG/ACT inhaler INHALE 2 PUFFS INTO THE LUNGS EVERY 6 HOURS AS NEEDED FOR WHEEZING OR SHORTNESS OF BREATH. Patient taking differently: Inhale 1 puff into the lungs every 6 (six) hours as needed for wheezing or shortness of breath. INHALE 2 PUFFS INTO THE LUNGS EVERY 6 HOURS AS NEEDED FOR WHEEZING OR SHORTNESS OF BREATH. 03/08/20  Yes Perlie Mayo, NP  budesonide-formoterol (SYMBICORT) 80-4.5 MCG/ACT inhaler INHALE 2 PUFFS INTO LUNGS TWICE DAILY. Patient taking differently: Inhale 2 puffs into the lungs in the morning and at bedtime. INHALE 2 PUFFS INTO LUNGS TWICE DAILY. 03/08/20  Yes Perlie Mayo, NP  busPIRone (BUSPAR) 7.5 MG tablet TAKE 1 TABLET BY MOUTH 3 TIMES DAILY. Patient taking differently: Take 7.5 mg by mouth 3 (three) times daily.  02/06/20  Yes Perlie Mayo, NP  docusate sodium (COLACE) 100 MG capsule Take 100 mg by mouth daily as needed for mild constipation.    Yes [provider]  GLIPIZIDE XL 5 MG 24 hr tablet TAKE 1 TABLET DAILY WITH BREAKFAST. Patient taking differently: Take 5 mg by mouth daily with breakfast.  01/26/20  Yes Fayrene Helper, MD  hydrocortisone (ANUSOL-HC) 2.5 % rectal cream Place 1 application rectally 2 (two) times daily as needed for hemorrhoids  or anal itching. Use up to twice daily, up to 10 days at a time 03/18/20  Yes Gordy Levan, Jake Samples, NP  olmesartan (BENICAR) 40 MG tablet TAKE 1 TABLET BY MOUTH ONCE A DAY. Patient taking differently: Take 40 mg by mouth daily.  02/06/20  Yes Perlie Mayo, NP  pantoprazole (PROTONIX) 40 MG tablet Take 1 tablet (40 mg total) by mouth daily. 03/18/20  Yes Carlis Stable, NP  pravastatin (PRAVACHOL) 20 MG tablet TAKE (1) TABLET BY MOUTH AT BEDTIME FOR  CHOLESTEROL. Patient taking differently: Take 20 mg by mouth daily. TAKE (1) TABLET BY MOUTH AT BEDTIME FOR CHOLESTEROL. 02/06/20  Yes Perlie Mayo, NP  risperiDONE (RISPERDAL) 1 MG tablet TAKE (1) TABLET BY MOUTH AT BEDTIME WITH '4MG'$ . Patient taking differently: Take 1 mg by mouth See admin instructions. TAKE (1) TABLET BY MOUTH AT BEDTIME WITH '4MG'$ . 02/06/20  Yes Perlie Mayo, NP  risperidone (RISPERDAL) 4 MG tablet Takes with a 1 mg tablet at night to = 5 mg dose Patient taking differently: Take 4 mg by mouth See admin instructions. Takes with a 1 mg tablet at night to = 5 mg dose 10/08/19  Yes Fayrene Helper, MD  traZODone (DESYREL) 50 MG tablet TAKE ONE TABLET BY MOUTH AT BEDTIME AS NEEDED FOR SLEEP. Patient taking differently: Take 50 mg by mouth at bedtime as needed for sleep.  02/02/20  Yes Fayrene Helper, MD  triamterene-hydrochlorothiazide (MAXZIDE-25) 37.5-25 MG tablet Take 0.5 tablets by mouth daily. 10/08/19  Yes Fayrene Helper, MD  Accu-Chek Softclix Lancets lancets TEST BLOOD SUGAR ONCE DAILY AS DIRECTED. 07/07/19   Fayrene Helper, MD  Blood Glucose Monitoring Suppl (ACCU-CHEK AVIVA PLUS) w/Device KIT accucheck aviva meter Once daily testing DX e11.9 07/09/19   Fayrene Helper, MD  glucose blood (ACCU-CHEK AVIVA PLUS) test strip USE TO TEST ONCE DAILY 07/07/19   Fayrene Helper, MD  ipratropium-albuterol (DUONEB) 0.5-2.5 (3) MG/3ML SOLN USE 1 VIAL BY NEBULIZATION EVERY 4 TO 6 HOURS Patient taking differently: Inhale 3 mLs into the lungs every 4 (four) hours as needed (SOB). USE 1 VIAL BY NEBULIZATION EVERY 4 TO 6 HOURS 10/24/19   Fayrene Helper, MD  UNABLE TO FIND Nebulizer mouthpiece and tubing x 1  DX COPD 05/29/19   Fayrene Helper, MD  UNABLE TO FIND XL pullups for daily use DX Incontinence 10/08/19   Fayrene Helper, MD  UNABLE TO FIND Nebulizer tubing x 1  DX J44.9 10/14/19   Fayrene Helper, MD  sildenafil (VIAGRA) 100 MG tablet Take  100 mg by mouth. Take one tablet 30 mins before intercourse   03/11/12  [provider]    Allergies as of 03/18/2020 - Review Complete 03/18/2020  Allergen Reaction Noted  . Sertraline hcl  04/17/2011  . Wellbutrin [bupropion] Other (See Comments) 07/29/2015    Family History  Problem Relation Age of Onset  . Diabetes Mother   . Hypertension Mother   . Stroke Mother   . Diabetes Sister   . Heart disease Sister   . Kidney disease Father   . Drug abuse Brother   . Colon cancer Neg Hx   . Colon polyps Neg Hx     Social History   Socioeconomic History  . Marital status: Divorced    Spouse name: Not on file  . Number of children: 2  . Years of education: Not on file  . Highest education level: Not on file  Occupational  History  . Occupation: retired from [employer]  . Smoking status: Current Every Day Smoker    Packs/day: 1.00    Years: 48.00    Pack years: 48.00    Types: Cigarettes    Last attempt to quit: 05/02/2017    Years since quitting: 2.9  . Smokeless tobacco: Never Used  Substance and Sexual Activity  . Alcohol use: Not Currently    Alcohol/week: 0.0 standard drinks  . Drug use: No  . Sexual activity: Not Currently    Birth control/protection: None  Other Topics Concern  . Not on file  Social History Narrative   ** Merged History Encounter **       ** Merged History Encounter **       Social Determinants of Health   Financial Resource Strain:   . Difficulty of Paying Living Expenses:   Food Insecurity:   . Worried About Charity fundraiser in the Last Year:   . Arboriculturist in the Last Year:   Transportation Needs:   . Film/video editor (Medical):   Marland Kitchen Lack of Transportation (Non-Medical):   Physical Activity:   . Days of Exercise per Week:   . Minutes of Exercise per Session:   Stress:   . Feeling of Stress :   Social Connections:   . Frequency of Communication with Friends and Family:   . Frequency of Social  Gatherings with Friends and Family:   . Attends Religious Services:   . Active Member of Clubs or Organizations:   . Attends Archivist Meetings:   Marland Kitchen Marital Status:   Intimate Partner Violence:   . Fear of Current or Ex-Partner:   . Emotionally Abused:   Marland Kitchen Physically Abused:   . Sexually Abused:     Review of Systems: See HPI, otherwise negative ROS   Physical Exam: BP 132/77   Pulse (!) 111   Temp 97.9 F (36.6 C) (Oral)   Resp (!) 21   Ht '6\' 1"'$  (1.854 m)   SpO2 95%   BMI 31.64 kg/m  General:   Alert,  pleasant and cooperative in NAD Head:  Normocephalic and atraumatic. Neck:  Supple; Lungs:  Clear throughout to auscultation.    Heart:  Regular rate and irregular;ar rhythm. Abdomen:  Soft, nontender and nondistended. Normal bowel sounds, without guarding, and without rebound.   Neurologic:  Alert and  oriented x4;  grossly normal neurologically.  Impression/Plan:    BRBPR/abnormal CT  PLAN: TCS TODAY.DISCUSSED PROCEDURE, BENEFITS, & RISKS: < 1% chance of medication reaction, bleeding, perforation, ASPIRATION, or rupture of spleen/liver requiring surgery to fix it and missed polyps < 1 cm 10-20% of the time.

## 2020-04-05 LAB — SURGICAL PATHOLOGY

## 2020-04-07 ENCOUNTER — Other Ambulatory Visit: Payer: Self-pay | Admitting: *Deleted

## 2020-04-07 DIAGNOSIS — J449 Chronic obstructive pulmonary disease, unspecified: Secondary | ICD-10-CM

## 2020-04-07 MED ORDER — BUDESONIDE-FORMOTEROL FUMARATE 80-4.5 MCG/ACT IN AERO: INHALATION_SPRAY | RESPIRATORY_TRACT | 0 refills | Status: DC

## 2020-04-07 MED ORDER — ALBUTEROL SULFATE HFA 108 (90 BASE) MCG/ACT IN AERS: 1.0000 | INHALATION_SPRAY | Freq: Four times a day (QID) | RESPIRATORY_TRACT | 0 refills | Status: DC | PRN

## 2020-04-08 ENCOUNTER — Other Ambulatory Visit: Payer: Self-pay | Admitting: Family Medicine

## 2020-04-10 ENCOUNTER — Other Ambulatory Visit: Payer: Self-pay | Admitting: Family Medicine

## 2020-04-10 DIAGNOSIS — F209 Schizophrenia, unspecified: Secondary | ICD-10-CM

## 2020-04-13 ENCOUNTER — Telehealth: Payer: Self-pay | Admitting: Gastroenterology

## 2020-04-13 DIAGNOSIS — K6289 Other specified diseases of anus and rectum: Secondary | ICD-10-CM

## 2020-04-13 DIAGNOSIS — K649 Unspecified hemorrhoids: Secondary | ICD-10-CM

## 2020-04-13 NOTE — Telephone Encounter (Signed)
Pt had procedure by SF on 4/15 and is hurting in his side. He wants to speak with nurse. [phone]

## 2020-04-13 NOTE — Telephone Encounter (Signed)
Called verified name and dob pt states rectum is hurting. Stated hurting today denies any n/v/d abd or bleeding in rectum. Last bm was this morning took 2 stool softer and it was normal. Not having any other problems

## 2020-04-15 NOTE — Telephone Encounter (Signed)
Can you ask what the pain is like? Is it burning/itching or is it sharp like razor blades?  Does he have any topical cream that he's tried (Preparation H or Anusol/Hydrocortisone)?

## 2020-04-15 NOTE — Telephone Encounter (Signed)
Called lmom

## 2020-04-19 MED ORDER — HYDROCORTISONE (PERIANAL) 2.5 % EX CREA: 1.0000 "application " | TOPICAL_CREAM | Freq: Two times a day (BID) | CUTANEOUS | 2 refills | Status: DC | PRN

## 2020-04-19 NOTE — Telephone Encounter (Signed)
Pt stated he does not want to use preperation H. He herd it burns and wants the rx sent in to his pharmacy. I encouraged him to try this. However, pt refused and wants the rx

## 2020-04-19 NOTE — Telephone Encounter (Signed)
Try Preparation H. If that doesn't help, I could send in Anusol. Call if any worsening

## 2020-04-19 NOTE — Addendum Note (Signed)
Addended by: Gordy Levan, Bassheva Flury A on: 04/19/2020 03:23 PM   Modules accepted: Orders

## 2020-04-19 NOTE — Telephone Encounter (Signed)
Called pt and notified him that rx was sent into [facility]

## 2020-04-19 NOTE — Telephone Encounter (Signed)
Rx sent to pharmacy   

## 2020-04-19 NOTE — Telephone Encounter (Signed)
Called verified name and dob Pt stated the pain in his bottom is not like a buring or itching feeling or sharp like razor blades. It Is a mild a ackey pain, pt also stated he has not tried preparation h or/ hydrocortisone

## 2020-04-27 ENCOUNTER — Ambulatory Visit: Payer: Medicare Other | Admitting: Family Medicine

## 2020-05-01 DIAGNOSIS — J449 Chronic obstructive pulmonary disease, unspecified: Secondary | ICD-10-CM | POA: Diagnosis not present

## 2020-05-02 ENCOUNTER — Telehealth: Payer: Self-pay | Admitting: Family Medicine

## 2020-05-02 NOTE — Telephone Encounter (Signed)
Has appt for may 25, needs fasting lipid, cmp and EGFr, hBA1C and CBC 3 to 5 days prior, please order and let him know. DX , HTN, hyperlipid, DM and anemia, thanks

## 2020-05-03 ENCOUNTER — Other Ambulatory Visit: Payer: Self-pay | Admitting: Family Medicine

## 2020-05-03 ENCOUNTER — Other Ambulatory Visit: Payer: Self-pay | Admitting: *Deleted

## 2020-05-03 DIAGNOSIS — E785 Hyperlipidemia, unspecified: Secondary | ICD-10-CM

## 2020-05-03 DIAGNOSIS — E1169 Type 2 diabetes mellitus with other specified complication: Secondary | ICD-10-CM

## 2020-05-03 DIAGNOSIS — I10 Essential (primary) hypertension: Secondary | ICD-10-CM

## 2020-05-03 DIAGNOSIS — D62 Acute posthemorrhagic anemia: Secondary | ICD-10-CM

## 2020-05-03 DIAGNOSIS — E669 Obesity, unspecified: Secondary | ICD-10-CM

## 2020-05-03 NOTE — Telephone Encounter (Signed)
LVM for pt to call the office labs placed

## 2020-05-07 ENCOUNTER — Other Ambulatory Visit: Payer: Self-pay | Admitting: *Deleted

## 2020-05-07 ENCOUNTER — Telehealth: Payer: Self-pay

## 2020-05-07 ENCOUNTER — Other Ambulatory Visit: Payer: Self-pay | Admitting: Family Medicine

## 2020-05-07 DIAGNOSIS — J449 Chronic obstructive pulmonary disease, unspecified: Secondary | ICD-10-CM

## 2020-05-07 MED ORDER — ALBUTEROL SULFATE HFA 108 (90 BASE) MCG/ACT IN AERS: 1.0000 | INHALATION_SPRAY | Freq: Four times a day (QID) | RESPIRATORY_TRACT | 0 refills | Status: DC | PRN

## 2020-05-07 MED ORDER — BUDESONIDE-FORMOTEROL FUMARATE 80-4.5 MCG/ACT IN AERO: INHALATION_SPRAY | RESPIRATORY_TRACT | 0 refills | Status: DC

## 2020-05-07 NOTE — Telephone Encounter (Signed)
Noted, ordered. Thank you

## 2020-05-07 NOTE — Telephone Encounter (Signed)
Pt needs Trazodone, Symbicort & Albuteral called in

## 2020-05-07 NOTE — Telephone Encounter (Signed)
Symbicort and albuterol called in would you like to refill trazodone pt has appt 05-12-20 with you

## 2020-05-10 ENCOUNTER — Other Ambulatory Visit: Payer: Self-pay | Admitting: Family Medicine

## 2020-05-10 DIAGNOSIS — F209 Schizophrenia, unspecified: Secondary | ICD-10-CM

## 2020-05-11 ENCOUNTER — Other Ambulatory Visit: Payer: Self-pay

## 2020-05-11 ENCOUNTER — Telehealth (INDEPENDENT_AMBULATORY_CARE_PROVIDER_SITE_OTHER): Payer: Medicare Other | Admitting: Family Medicine

## 2020-05-11 ENCOUNTER — Encounter: Payer: Self-pay | Admitting: Family Medicine

## 2020-05-11 VITALS — BP 120/84 | Ht 73.0 in | Wt 240.0 lb

## 2020-05-11 DIAGNOSIS — E1169 Type 2 diabetes mellitus with other specified complication: Secondary | ICD-10-CM | POA: Diagnosis not present

## 2020-05-11 DIAGNOSIS — E669 Obesity, unspecified: Secondary | ICD-10-CM | POA: Diagnosis not present

## 2020-05-11 DIAGNOSIS — I10 Essential (primary) hypertension: Secondary | ICD-10-CM

## 2020-05-11 DIAGNOSIS — Z720 Tobacco use: Secondary | ICD-10-CM | POA: Diagnosis not present

## 2020-05-11 NOTE — Assessment & Plan Note (Signed)
Asked about quitting: confirms they are currently smokes cigarettes Advise to quit smoking: Educated about QUITTING to reduce the risk of cancer, cardio and cerebrovascular disease. Assess willingness: Unwilling to quit at this time, but is working on cutting back. Assist with counseling and pharmacotherapy: Counseled for 5 minutes and literature provided. Arrange for follow up:  not quitting follow up in 3 months and continue to offer help.   

## 2020-05-11 NOTE — Patient Instructions (Addendum)
I appreciate the opportunity to provide you with care for your health and wellness. Today we discussed: overall health   Follow up: 3 months  Labs 1 week before next appt No referrals today  Continue to eat a well balanced diet. Check blood sugars and blood pressure as directed.  We look forward to seeing you in Aug in the office :)  Please continue to practice social distancing to keep you, your family, and our community safe.  If you must go out, please wear a mask and practice good handwashing.  It was a pleasure to see you and I look forward to continuing to work together on your health and well-being. Please do not hesitate to call the office if you need care or have questions about your care.  Have a wonderful day and week. With Gratitude, Cherly Beach, DNP, AGNP-BC

## 2020-05-11 NOTE — Assessment & Plan Note (Signed)
Matthew Molina is encouraged to maintain a well balanced diet that is low in salt. Controlled, continue current medication regimen.   Additionally, he is also reminded that exercise is beneficial for heart health and control of  Blood pressure. 30-60 minutes daily is recommended-walking was suggested.

## 2020-05-11 NOTE — Progress Notes (Signed)
Virtual Visit via Telephone Note   This visit type was conducted due to national recommendations for restrictions regarding the COVID-19 Pandemic (e.g. social distancing) in an effort to limit this patient's exposure and mitigate transmission in our community.  Due to his co-morbid illnesses, this patient is at least at moderate risk for complications without adequate follow up.  This format is felt to be most appropriate for this patient at this time.  The patient did not have access to video technology/had technical difficulties with video requiring transitioning to audio format only (telephone).  All issues noted in this document were discussed and addressed.  No physical exam could be performed with this format.   Evaluation Performed:  Follow-up visit  Date:  05/11/2020   ID:  Matthew Molina, DOB 10-29-1951, MRN [id number]  Patient Location: Home Provider Location: Office  Location of Patient: Home Location of Provider: Telehealth Consent was obtain for visit to be over via telehealth. I verified that I am speaking with the correct person using two identifiers.  PCP:  Fayrene Helper, MD   Chief Complaint:  Follow up for chronic conditions  History of Present Illness:    Matthew Molina is a 69 y.o. male with history of hypertension, hyper lipidemia, insomnia, diabetes among others. Reports taking medications as directed and without any issue. Denies cough, fevers, chills, no shortness of breath. Denies bladder or bowel habits.  Denies trouble eating or drinking fluids. Denies choking. Denies chest pain, leg swelling, palpitations.  Denies having any falls.  Denies trouble with sleeping.   The patient does not have symptoms concerning for COVID-19 infection (fever, chills, cough, or new shortness of breath).   Past Medical, Surgical, Social History, Allergies, and Medications have been Reviewed.  Past Medical History:  Diagnosis Date  . Acute kidney injury ([place])    . Acute respiratory failure with hypoxia ([place])   . Arthritis   . COPD (chronic obstructive pulmonary disease) ([place])   . Depression   . Diabetes mellitus   . Diabetes mellitus without complication ([place])   . Elevated PSA 10/01/2016  . Hypercholesterolemia   . Hyperlipidemia   . Hypertension   . Insomnia 01/05/2016  . Multiple lung nodules on CT 04/02/2015  . Nicotine addiction   . Obesity   . Oxygen deficiency    qhs  . Schizophrenia [facility])    Past Surgical History:  Procedure Laterality Date  . CATARACT EXTRACTION W/PHACO Left 11/20/2013   Procedure: CATARACT EXTRACTION PHACO AND INTRAOCULAR LENS PLACEMENT (IOC);  Surgeon: Tonny Branch, MD;  Location: [facility] ORS;  Service: Ophthalmology;  Laterality: Left;  CDE:10.26  . CATARACT EXTRACTION W/PHACO Right 12/08/2013   Procedure: RIGHT EYE CATARACT EXTRACTION PHACO AND INTRAOCULAR LENS PLACEMENT ;  Surgeon: Tonny Branch, MD;  Location: [facility] ORS;  Service: Ophthalmology;  Laterality: Right;  CDE 12.38  . COLONOSCOPY N/A 04/01/2013   pancolonic diverticulosis, redundant colon, large internal hemorrhoids.   . COLONOSCOPY  2014   INCOMPLETE PREP IN R COLON  . COLONOSCOPY N/A 04/01/2020   Procedure: COLONOSCOPY;  Surgeon: Danie Binder, MD;  Location: [facility];  Service: Endoscopy;  Laterality: N/A;  8:45am  . ESOPHAGOGASTRODUODENOSCOPY N/A 02/18/2016   stricture at GE junction, moderate erosive gastritis and mild non-erosive duodenitis. +H.pylori. Treated initially with Prevpac. Breath test to check for eradication was positive, and he was prescribed Pylera.   Marland Kitchen EYE SURGERY Left 11/2013   cataract extraction  . POLYPECTOMY  04/01/2020   Procedure:  POLYPECTOMY;  Surgeon: Danie Binder, MD;  Location: [facility];  Service: Endoscopy;;  . SAVORY DILATION N/A 02/18/2016   Procedure: SAVORY DILATION;  Surgeon: Danie Binder, MD;  Location: [facility];  Service: Endoscopy;  Laterality: N/A;  . SHOULDER SURGERY       Current Meds  Medication  Sig  . Accu-Chek Softclix Lancets lancets TEST BLOOD SUGAR ONCE DAILY AS DIRECTED.  Marland Kitchen acetaminophen (TYLENOL) 500 MG tablet Take 500-1,000 mg by mouth daily as needed for headache. Takes mostly for HA about 2 x/week.   Marland Kitchen albuterol (VENTOLIN HFA) 108 (90 Base) MCG/ACT inhaler Inhale 1 puff into the lungs every 6 (six) hours as needed for wheezing or shortness of breath. INHALE 2 PUFFS INTO THE LUNGS EVERY 6 HOURS AS NEEDED FOR WHEEZING OR SHORTNESS OF BREATH.  Marland Kitchen Blood Glucose Monitoring Suppl (ACCU-CHEK AVIVA PLUS) w/Device KIT accucheck aviva meter Once daily testing DX e11.9  . budesonide-formoterol (SYMBICORT) 80-4.5 MCG/ACT inhaler INHALE 2 PUFFS INTO LUNGS TWICE DAILY.  . busPIRone (BUSPAR) 7.5 MG tablet TAKE 1 TABLET BY MOUTH 3 TIMES DAILY.  Marland Kitchen docusate sodium (COLACE) 100 MG capsule Take 100 mg by mouth daily as needed for mild constipation.   Marland Kitchen GLIPIZIDE XL 5 MG 24 hr tablet TAKE 1 TABLET DAILY WITH BREAKFAST. (Patient taking differently: Take 5 mg by mouth daily with breakfast. )  . glucose blood (ACCU-CHEK AVIVA PLUS) test strip USE TO TEST ONCE DAILY  . hydrocortisone (ANUSOL-HC) 2.5 % rectal cream Place 1 application rectally 2 (two) times daily as needed for hemorrhoids or anal itching. For up to 10 days at a time.  Marland Kitchen ipratropium-albuterol (DUONEB) 0.5-2.5 (3) MG/3ML SOLN USE 1 VIAL BY NEBULIZATION EVERY 4 TO 6 HOURS (Patient taking differently: Inhale 3 mLs into the lungs every 4 (four) hours as needed (SOB). USE 1 VIAL BY NEBULIZATION EVERY 4 TO 6 HOURS)  . olmesartan (BENICAR) 40 MG tablet TAKE 1 TABLET BY MOUTH ONCE A DAY. (Patient taking differently: Take 40 mg by mouth daily. )  . pantoprazole (PROTONIX) 40 MG tablet Take 1 tablet (40 mg total) by mouth daily.  . pravastatin (PRAVACHOL) 20 MG tablet TAKE (1) TABLET BY MOUTH AT BEDTIME FOR CHOLESTEROL. (Patient taking differently: Take 20 mg by mouth daily. TAKE (1) TABLET BY MOUTH AT BEDTIME FOR CHOLESTEROL.)  . risperiDONE  (RISPERDAL) 1 MG tablet TAKE (1) TABLET BY MOUTH AT BEDTIME WITH '4MG'$ . (Patient taking differently: Take 1 mg by mouth See admin instructions. TAKE (1) TABLET BY MOUTH AT BEDTIME WITH '4MG'$ .)  . risperidone (RISPERDAL) 4 MG tablet TAKE ONE TABLET BY MOUTH IN THE EVENING. TAKE WITH '1MG'$  TABLET.  Marland Kitchen traZODone (DESYREL) 50 MG tablet TAKE ONE TABLET BY MOUTH AT BEDTIME AS NEEDED FOR SLEEP.  Marland Kitchen triamterene-hydrochlorothiazide (MAXZIDE-25) 37.5-25 MG tablet Take 0.5 tablets by mouth daily.  Marland Kitchen UNABLE TO FIND Nebulizer mouthpiece and tubing x 1  DX COPD  . UNABLE TO FIND XL pullups for daily use DX Incontinence  . UNABLE TO FIND Nebulizer tubing x 1  DX J44.9     Allergies:   Sertraline hcl and Wellbutrin [bupropion]   ROS:   Please see the history of present illness.    All other systems reviewed and are negative.   Labs/Other Tests and Data Reviewed:    Recent Labs: 01/14/2020: Magnesium 2.1 03/10/2020: ALT 30; BUN 13; Creatinine, Ser 1.91; Potassium 4.0; Sodium 130 03/19/2020: Hemoglobin 12.6; Platelets 262   Recent Lipid Panel Lab Results  Component Value Date/Time   CHOL 158 07/09/2019 07:32 AM   TRIG 161 (H) 07/09/2019 07:32 AM   HDL 36 (L) 07/09/2019 07:32 AM   CHOLHDL 4.4 07/09/2019 07:32 AM   LDLCALC 96 07/09/2019 07:32 AM    Wt Readings from Last 3 Encounters:  05/11/20 240 lb (108.9 kg)  03/18/20 239 lb 12.8 oz (108.8 kg)  03/10/20 240 lb (108.9 kg)     Objective:    Vital Signs:  BP 120/84   Ht '6\' 1"'$  (1.854 m)   Wt 240 lb (108.9 kg)   BMI 31.66 kg/m    VITAL SIGNS:  reviewed GEN:  Alert and oriented RESPIRATORY:  No shortness of breath noted in conversation PSYCH:  Normal affect and mood  ASSESSMENT & PLAN:    1. Diabetes mellitus type 2 in obese (HCC)  - Hemoglobin A1c  2. Essential hypertension  - CBC - COMPLETE METABOLIC PANEL WITH GFR  3. Tobacco abuse   Time:   Today, I have spent 10 minutes with the patient with telehealth technology discussing  the above problems.     Medication Adjustments/Labs and Tests Ordered: Current medicines are reviewed at length with the patient today.  Concerns regarding medicines are outlined above.   Tests Ordered: No orders of the defined types were placed in this encounter.   Medication Changes: No orders of the defined types were placed in this encounter.   Disposition:  Follow up 3 months  Signed, Perlie Mayo, NP  05/11/2020 9:42 AM     [place]

## 2020-05-11 NOTE — Assessment & Plan Note (Signed)
Matthew Molina is encouraged to check blood sugar daily as directed. Continue current medications. Is on statin as well. Educated on importance of maintain a well balanced diabetic friendly diet.  He is reminded the importance of maintaining  good blood sugars,  taking medications as directed, daily foot care, annual eye exams. Additionally educated about keeping good control over blood pressure and cholesterol as well.

## 2020-05-26 ENCOUNTER — Encounter: Payer: Self-pay | Admitting: Family Medicine

## 2020-05-26 ENCOUNTER — Other Ambulatory Visit (HOSPITAL_COMMUNITY)
Admission: RE | Admit: 2020-05-26 | Discharge: 2020-05-26 | Disposition: A | Discharge status: Home / Self Care | Payer: Medicare Other | Source: Other Acute Inpatient Hospital | Attending: Family Medicine | Admitting: Family Medicine

## 2020-05-26 ENCOUNTER — Other Ambulatory Visit: Payer: Self-pay

## 2020-05-26 ENCOUNTER — Ambulatory Visit (INDEPENDENT_AMBULATORY_CARE_PROVIDER_SITE_OTHER): Payer: Medicare Other | Admitting: Family Medicine

## 2020-05-26 VITALS — BP 138/88 | HR 98 | Resp 16 | Ht 73.0 in | Wt 239.0 lb

## 2020-05-26 DIAGNOSIS — E785 Hyperlipidemia, unspecified: Secondary | ICD-10-CM | POA: Insufficient documentation

## 2020-05-26 DIAGNOSIS — E669 Obesity, unspecified: Secondary | ICD-10-CM | POA: Diagnosis not present

## 2020-05-26 DIAGNOSIS — I10 Essential (primary) hypertension: Secondary | ICD-10-CM

## 2020-05-26 DIAGNOSIS — E119 Type 2 diabetes mellitus without complications: Secondary | ICD-10-CM

## 2020-05-26 DIAGNOSIS — Z125 Encounter for screening for malignant neoplasm of prostate: Secondary | ICD-10-CM

## 2020-05-26 DIAGNOSIS — E1169 Type 2 diabetes mellitus with other specified complication: Secondary | ICD-10-CM | POA: Diagnosis not present

## 2020-05-26 DIAGNOSIS — M549 Dorsalgia, unspecified: Secondary | ICD-10-CM

## 2020-05-26 DIAGNOSIS — E66811 Obesity, class 1: Secondary | ICD-10-CM

## 2020-05-26 DIAGNOSIS — F209 Schizophrenia, unspecified: Secondary | ICD-10-CM

## 2020-05-26 DIAGNOSIS — E559 Vitamin D deficiency, unspecified: Secondary | ICD-10-CM | POA: Diagnosis not present

## 2020-05-26 DIAGNOSIS — Z720 Tobacco use: Secondary | ICD-10-CM

## 2020-05-26 DIAGNOSIS — R972 Elevated prostate specific antigen [PSA]: Secondary | ICD-10-CM

## 2020-05-26 NOTE — Progress Notes (Signed)
Matthew Molina     MRN: [id number]      DOB: 17-Mar-1951   HPI Matthew Molina is here for follow up and re-evaluation of chronic medical conditions, medication management and review of any available recent lab and radiology data.  Preventive health is updated, specifically  Cancer screening and Immunization.   Requests lift chair and pull ups. Has arthritis in back, knees and hips has intermittent pain, difficult to get up from chair at home all the time, it is low and has no arm rests Wetting accidents when away from home , not at night Requests life alert in case he falls, last fall was 1 year ago at home Requests pull ups for use outside of his home , as  He has wetting  Accidents. Does not want or require padding for his bed, has no nocturia ROS Denies recent fever or chills. Denies sinus pressure, nasal congestion, ear pain or sore throat. Denies chest congestion, productive cough or wheezing. Denies chest pains, palpitations and leg swelling Denies abdominal pain, nausea, vomiting,diarrhea or constipation.   Denies dysuria  Denies headaches, seizures, numbness, or tingling. Denies uncontrolled depression, anxiety or insomnia. Denies skin break down or rash. Denies polyuria, polydipsia, blurred vision , or hypoglycemic episodes.    PE  BP 138/88   Pulse (!) 104   Resp 16   Ht 6\' 1"  (1.854 m)   Wt 239 lb (108.4 kg)   SpO2 95%   BMI 31.53 kg/m   Patient alert and oriented and in no cardiopulmonary distress.  HEENT: No facial asymmetry, EOMI,     Neck decreased ROM.  Chest: Clear to auscultation bilaterally decreased air entry  CVS: S1, S2 no murmurs, no S3.Regular rate.  ABD: Soft non tender.   Ext: No edema  Decreased  ROM spine, shoulders, hips and knees.  Skin: Intact, no ulcerations or rash noted.  Psych: Good eye contact, normal affect. Memory intact not anxious or depressed appearing.  CNS: CN 2-12 intact, power,  normal throughout.no focal deficits  noted.   Assessment & Plan  Diabetes mellitus type 2 in obese [facility]) Matthew Molina is reminded of the importance of commitment to daily physical activity for 30 minutes or more, as able and the need to limit carbohydrate intake to 30 to 60 grams per meal to help with blood sugar control.   The need to take medication as prescribed, test blood sugar as directed, and to call between visits if there is a concern that blood sugar is uncontrolled is also discussed.   Matthew Molina is reminded of the importance of daily foot exam, annual eye examination, and good blood sugar, blood pressure and cholesterol control.  Diabetic Labs Latest Ref Rng & Units 05/27/2020 05/26/2020 03/10/2020 01/14/2020 01/08/2020  HbA1c 4.0 - 5.6 % 7.8(A) - - - -  Microalbumin mg/dL - 1.8 - - -  Micro/Creat Ratio 0.0 - 30.0 mg/g creat - - - - -  Chol <200 mg/dL - 183 - - -  HDL > OR = 40 mg/dL - 34(L) - - -  Calc LDL mg/dL (calc) - 104(H) - - -  Triglycerides <150 mg/dL - 341(H) - - -  Creatinine 0.70 - 1.18 mg/dL - 1.38(H) 1.91(H) 1.21(H) 1.30(H)   BP/Weight 05/26/2020 05/11/2020 04/01/2020 03/18/2020 03/10/2020 01/22/2020 8/67/6195  Systolic BP 093 267 124 580 92 998 338  Diastolic BP 88 84 84 89 61 67 70  Wt. (Lbs) 239 240 - 239.8 240 240 244.04  BMI 31.53  31.66 31.64 31.64 31.66 31.66 32.2  Some encounter information is confidential and restricted. Go to Review Flowsheets activity to see all data.   Foot/eye exam completion dates Latest Ref Rng & Units 10/08/2019 10/15/2018  Eye Exam No Retinopathy - No Retinopathy  Foot Form Completion - Done -   Deteriorated, increase glipizide to 10 mg daily, rept in 3 mon ths    Hypertension Controlled, no change in medication DASH diet and commitment to daily physical activity for a minimum of 30 minutes discussed and encouraged, as a part of hypertension management. The importance of attaining a healthy weight is also discussed.  BP/Weight 05/26/2020 05/11/2020 04/01/2020 03/18/2020  03/10/2020 01/22/2020 2/48/2500  Systolic BP 370 488 891 694 92 503 888  Diastolic BP 88 84 84 89 61 67 70  Wt. (Lbs) 239 240 - 239.8 240 240 244.04  BMI 31.53 31.66 31.64 31.64 31.66 31.66 32.2  Some encounter information is confidential and restricted. Go to Review Flowsheets activity to see all data.       Tobacco abuse Asked:confirms currently smokes cigarettes, 1 PPD Assess: Unwilling to quit not t cutting back Advise: needs to QUIT to reduce risk of cancer, cardio and cerebrovascular disease Assist: counseled for 5 minutes and literature provided Arrange: follow up in 3 months   Obesity (BMI 30.0-34.9)  Patient re-educated about  the importance of commitment to a  minimum of 150 minutes of exercise per week as able.  The importance of healthy food choices with portion control discussed, as well as eating regularly and within a 12 hour window most days. The need to choose "clean , green" food 50 to 75% of the time is discussed, as well as to make water the primary drink and set a goal of 64 ounces water daily.    Weight /BMI 05/26/2020 05/11/2020 04/01/2020  WEIGHT 239 lb 240 lb -  HEIGHT 6\' 1"  6\' 1"  6\' 1"   BMI 31.53 kg/m2 31.66 kg/m2 31.64 kg/m2  Some encounter information is confidential and restricted. Go to Review Flowsheets activity to see all data.      Hyperlipidemia Hyperlipidemia:Low fat diet discussed and encouraged.   Lipid Panel  Lab Results  Component Value Date   CHOL 183 05/26/2020   HDL 34 (L) 05/26/2020   LDLCALC 104 (H) 05/26/2020   TRIG 341 (H) 05/26/2020   CHOLHDL 5.4 (H) 05/26/2020  needs to reduce fat in diet, tG are high    Schizophrenia, unspecified type (HCC) Stable and controlled on current medication  Back pain with radiation Reports chronic back pain worsening with reduced mobility, increasing difficulty rising from a sitting position, needs lift chair and is to get an X ary of his lumbar spine, he is aware  Elevated PSA Increased  since last checked, refer Urology

## 2020-05-26 NOTE — Patient Instructions (Addendum)
Annual physical exam in office with MD in Septemebr, call if you need me before   Wellness past due , please schedule  Nurse to track eye exam done in 2021, and document ( [address], [facility])   Non fasting lab today, cbc, lipid, cmp and EGFr, hBa1C, TSH and PSa and vit D and( microalb from office)  Please try to cut back on smoking!  We will order lift chair , life alert and pull ups  Think about what you will eat, plan ahead. Choose " clean, green, fresh or frozen" over canned, processed or packaged foods which are more sugary, salty and fatty. 70 to 75% of food eaten should be vegetables and fruit. Three meals at set times with snacks allowed between meals, but they must be fruit or vegetables. Aim to eat over a 12 hour period , example 7 am to 7 pm, and STOP after  your last meal of the day. Drink water,generally about 64 ounces per day, no other drink is as healthy. Fruit juice is best enjoyed in a healthy way, by EATING the fruit.   It is important that you exercise regularly at least 30 minutes 5 times a week. If you develop chest pain, have severe difficulty breathing, or feel very tired, stop exercising immediately and seek medical attention    Thanks for choosing [facility], we consider it a privelige to serve you.

## 2020-05-27 ENCOUNTER — Other Ambulatory Visit: Payer: Self-pay

## 2020-05-27 DIAGNOSIS — E669 Obesity, unspecified: Secondary | ICD-10-CM

## 2020-05-27 DIAGNOSIS — E785 Hyperlipidemia, unspecified: Secondary | ICD-10-CM

## 2020-05-27 DIAGNOSIS — E1169 Type 2 diabetes mellitus with other specified complication: Secondary | ICD-10-CM

## 2020-05-27 LAB — LIPID PANEL
Cholesterol: 183 mg/dL (ref <200)
HDL: 34 mg/dL — ABNORMAL LOW (ref >=40)
LDL Cholesterol (Calc): 104 mg/dL (calc) — ABNORMAL HIGH
Non-HDL Cholesterol (Calc): 149 mg/dL (calc) — ABNORMAL HIGH (ref <130)
Total CHOL/HDL Ratio: 5.4 (calc) — ABNORMAL HIGH (ref <5.0)
Triglycerides: 341 mg/dL — ABNORMAL HIGH (ref <150)

## 2020-05-27 LAB — CBC
HCT: 40.3 % (ref 38.5–50.0)
Hemoglobin: 13.2 g/dL (ref 13.2–17.1)
MCH: 28 pg (ref 27.0–33.0)
MCHC: 32.8 g/dL (ref 32.0–36.0)
MCV: 85.6 fL (ref 80.0–100.0)
MPV: 10.5 fL (ref 7.5–12.5)
Platelets: 267 10*3/uL (ref 140–400)
RBC: 4.71 10*6/uL (ref 4.20–5.80)
RDW: 14.6 % (ref 11.0–15.0)
WBC: 7.3 10*3/uL (ref 3.8–10.8)

## 2020-05-27 LAB — MICROALBUMIN, URINE
Microalb, Ur: 1.8 mg/dL
Microalb, Ur: 36.7 ug/mL — ABNORMAL HIGH

## 2020-05-27 LAB — PSA: PSA: 9.3 ng/mL — ABNORMAL HIGH (ref <=4.0)

## 2020-05-27 LAB — COMPLETE METABOLIC PANEL WITH GFR
AG Ratio: 1.5 (calc) (ref 1.0–2.5)
ALT: 37 U/L (ref 9–46)
AST: 33 U/L (ref 10–35)
Albumin: 4.5 g/dL (ref 3.6–5.1)
Alkaline phosphatase (APISO): 59 U/L (ref 35–144)
BUN/Creatinine Ratio: 9 (calc) (ref 6–22)
BUN: 12 mg/dL (ref 7–25)
CO2: 28 mmol/L (ref 20–32)
Calcium: 10.6 mg/dL — ABNORMAL HIGH (ref 8.6–10.3)
Chloride: 99 mmol/L (ref 98–110)
Creat: 1.38 mg/dL — ABNORMAL HIGH (ref 0.70–1.18)
GFR, Est African American: 60 mL/min/{1.73_m2} (ref >=60)
GFR, Est Non African American: 51 mL/min/{1.73_m2} — ABNORMAL LOW (ref >=60)
Globulin: 3.1 g/dL (calc) (ref 1.9–3.7)
Glucose, Bld: 140 mg/dL — ABNORMAL HIGH (ref 65–139)
Potassium: 3.8 mmol/L (ref 3.5–5.3)
Sodium: 135 mmol/L (ref 135–146)
Total Bilirubin: 0.5 mg/dL (ref 0.2–1.2)
Total Protein: 7.6 g/dL (ref 6.1–8.1)

## 2020-05-27 LAB — TSH: TSH: 1.31 mIU/L (ref 0.40–4.50)

## 2020-05-27 LAB — POCT GLYCOSYLATED HEMOGLOBIN (HGB A1C): Hemoglobin A1C: 7.8 % — AB (ref 4.0–5.6)

## 2020-05-27 LAB — VITAMIN D 25 HYDROXY (VIT D DEFICIENCY, FRACTURES): Vit D, 25-Hydroxy: 29 ng/mL — ABNORMAL LOW (ref 30–100)

## 2020-05-27 MED ORDER — EZETIMIBE 10 MG PO TABS
10.0000 mg | ORAL_TABLET | Freq: Every day | ORAL | 1 refills | Status: DC
Start: 2020-05-27 — End: 2020-08-20

## 2020-05-27 MED ORDER — GLIPIZIDE ER 10 MG PO TB24: 10.0000 mg | ORAL_TABLET | Freq: Every day | ORAL | 3 refills | Status: DC

## 2020-05-27 NOTE — Assessment & Plan Note (Signed)
Hyperlipidemia:Low fat diet discussed and encouraged.   Lipid Panel  Lab Results  Component Value Date   CHOL 183 05/26/2020   HDL 34 (L) 05/26/2020   LDLCALC 104 (H) 05/26/2020   TRIG 341 (H) 05/26/2020   CHOLHDL 5.4 (H) 05/26/2020  needs to reduce fat in diet, tG are high

## 2020-05-27 NOTE — Assessment & Plan Note (Signed)
Stable and controlled on current medication 

## 2020-05-27 NOTE — Assessment & Plan Note (Signed)
  Patient re-educated about  the importance of commitment to a  minimum of 150 minutes of exercise per week as able.  The importance of healthy food choices with portion control discussed, as well as eating regularly and within a 12 hour window most days. The need to choose "clean , green" food 50 to 75% of the time is discussed, as well as to make water the primary drink and set a goal of 64 ounces water daily.    Weight /BMI 05/26/2020 05/11/2020 04/01/2020  WEIGHT 239 lb 240 lb -  HEIGHT 6\' 1"  6\' 1"  6\' 1"   BMI 31.53 kg/m2 31.66 kg/m2 31.64 kg/m2  Some encounter information is confidential and restricted. Go to Review Flowsheets activity to see all data.

## 2020-05-27 NOTE — Assessment & Plan Note (Signed)
Asked:confirms currently smokes cigarettes, 1 PPD Assess: Unwilling to quit not t cutting back Advise: needs to QUIT to reduce risk of cancer, cardio and cerebrovascular disease Assist: counseled for 5 minutes and literature provided Arrange: follow up in 3 months

## 2020-05-27 NOTE — Assessment & Plan Note (Signed)
Increased since last checked, refer Urology

## 2020-05-27 NOTE — Assessment & Plan Note (Signed)
Controlled, no change in medication DASH diet and commitment to daily physical activity for a minimum of 30 minutes discussed and encouraged, as a part of hypertension management. The importance of attaining a healthy weight is also discussed.  BP/Weight 05/26/2020 05/11/2020 04/01/2020 03/18/2020 03/10/2020 01/22/2020 1/48/3073  Systolic BP 543 014 840 397 92 953 692  Diastolic BP 88 84 84 89 61 67 70  Wt. (Lbs) 239 240 - 239.8 240 240 244.04  BMI 31.53 31.66 31.64 31.64 31.66 31.66 32.2  Some encounter information is confidential and restricted. Go to Review Flowsheets activity to see all data.

## 2020-05-27 NOTE — Assessment & Plan Note (Addendum)
Reports chronic back pain worsening with reduced mobility, increasing difficulty rising from a sitting position, needs lift chair and is to get an X ary of his lumbar spine, he is aware

## 2020-05-27 NOTE — Assessment & Plan Note (Addendum)
[name] is reminded of the importance of commitment to daily physical activity for 30 minutes or more, as able and the need to limit carbohydrate intake to 30 to 60 grams per meal to help with blood sugar control.   The need to take medication as prescribed, test blood sugar as directed, and to call between visits if there is a concern that blood sugar is uncontrolled is also discussed.   [name] is reminded of the importance of daily foot exam, annual eye examination, and good blood sugar, blood pressure and cholesterol control.  Diabetic Labs Latest Ref Rng & Units 05/27/2020 05/26/2020 03/10/2020 01/14/2020 01/08/2020  HbA1c 4.0 - 5.6 % 7.8(A) - - - -  Microalbumin mg/dL - 1.8 - - -  Micro/Creat Ratio 0.0 - 30.0 mg/g creat - - - - -  Chol <200 mg/dL - 183 - - -  HDL > OR = 40 mg/dL - 34(L) - - -  Calc LDL mg/dL (calc) - 104(H) - - -  Triglycerides <150 mg/dL - 341(H) - - -  Creatinine 0.70 - 1.18 mg/dL - 1.38(H) 1.91(H) 1.21(H) 1.30(H)   BP/Weight 05/26/2020 05/11/2020 04/01/2020 03/18/2020 03/10/2020 01/22/2020 1/60/7371  Systolic BP 062 694 854 627 92 035 009  Diastolic BP 88 84 84 89 61 67 70  Wt. (Lbs) 239 240 - 239.8 240 240 244.04  BMI 31.53 31.66 31.64 31.64 31.66 31.66 32.2  Some encounter information is confidential and restricted. Go to Review Flowsheets activity to see all data.   Foot/eye exam completion dates Latest Ref Rng & Units 10/08/2019 10/15/2018  Eye Exam No Retinopathy - No Retinopathy  Foot Form Completion - Done -   Deteriorated, increase glipizide to 10 mg daily, rept in 3 mon ths

## 2020-05-28 ENCOUNTER — Ambulatory Visit (HOSPITAL_COMMUNITY)
Admission: RE | Admit: 2020-05-28 | Discharge: 2020-05-28 | Disposition: A | Discharge status: Home / Self Care | Payer: Medicare Other | Source: Ambulatory Visit | Attending: Family Medicine | Admitting: Family Medicine

## 2020-05-28 ENCOUNTER — Telehealth: Payer: Self-pay

## 2020-05-28 ENCOUNTER — Other Ambulatory Visit: Payer: Self-pay

## 2020-05-28 DIAGNOSIS — M549 Dorsalgia, unspecified: Secondary | ICD-10-CM | POA: Insufficient documentation

## 2020-05-28 DIAGNOSIS — M545 Low back pain: Secondary | ICD-10-CM | POA: Diagnosis not present

## 2020-05-28 NOTE — Telephone Encounter (Signed)
Pt is calling --he is not sure if he needs to continue both Cholesterol medications. Dr Moshe Cipro gave him a new one to take in the AM, and he takes the old one at night ---is he suppose to continue with both

## 2020-05-28 NOTE — Telephone Encounter (Signed)
Pt advised that the only changes made were adding zetia continuing other medications with verbal understanding

## 2020-06-01 DIAGNOSIS — J449 Chronic obstructive pulmonary disease, unspecified: Secondary | ICD-10-CM | POA: Diagnosis not present

## 2020-06-02 ENCOUNTER — Telehealth: Payer: Self-pay

## 2020-06-02 ENCOUNTER — Other Ambulatory Visit: Payer: Self-pay

## 2020-06-02 MED ORDER — RISPERIDONE 1 MG PO TABS: 1.0000 mg | ORAL_TABLET | ORAL | 0 refills | Status: DC

## 2020-06-02 NOTE — Telephone Encounter (Signed)
Refill sent.

## 2020-06-02 NOTE — Telephone Encounter (Signed)
Pt LVM that she needs Risperidone 1 mg --called in --please call

## 2020-06-03 ENCOUNTER — Other Ambulatory Visit: Payer: Self-pay | Admitting: Family Medicine

## 2020-06-07 ENCOUNTER — Other Ambulatory Visit: Payer: Self-pay | Admitting: Family Medicine

## 2020-06-07 DIAGNOSIS — J449 Chronic obstructive pulmonary disease, unspecified: Secondary | ICD-10-CM

## 2020-06-23 ENCOUNTER — Ambulatory Visit (INDEPENDENT_AMBULATORY_CARE_PROVIDER_SITE_OTHER): Payer: Medicare Other | Admitting: Nurse Practitioner

## 2020-06-23 ENCOUNTER — Other Ambulatory Visit: Payer: Self-pay

## 2020-06-23 ENCOUNTER — Encounter: Payer: Self-pay | Admitting: Nurse Practitioner

## 2020-06-23 VITALS — BP 145/93 | HR 89 | Temp 97.7°F | Ht 73.0 in | Wt 237.2 lb

## 2020-06-23 DIAGNOSIS — K625 Hemorrhage of anus and rectum: Secondary | ICD-10-CM

## 2020-06-23 DIAGNOSIS — R1084 Generalized abdominal pain: Secondary | ICD-10-CM | POA: Diagnosis not present

## 2020-06-23 DIAGNOSIS — K219 Gastro-esophageal reflux disease without esophagitis: Secondary | ICD-10-CM | POA: Diagnosis not present

## 2020-06-23 NOTE — Assessment & Plan Note (Signed)
Abdominal pain has since resolved, doing well.  Recommend he continue to monitor, notify us of any recurrent abdominal pain especially if associated with bleeding.  Follow-up in 6 months otherwise.  Can recheck labs at that time.

## 2020-06-23 NOTE — Progress Notes (Signed)
Referring Provider: Fayrene Helper, MD Primary Care Physician:  Fayrene Helper, MD Primary GI:  Dr. Gala Romney (in the absence of Dr. Oneida Alar); pending Dr. Abbey Chatters  Chief Complaint  Patient presents with  . Abdominal Pain    no longer hurting  . Gastroesophageal Reflux    doing okay    HPI:   Matthew Molina is a 69 y.o. male who presents for follow-up on abdominal pain and GERD.  The patient was last seen in our office 03/18/2020 for lower GI bleed, GERD, abdominal pain, hemorrhoids.  Hospital admission earlier this year for CKD and acute blood loss anemia likely due to lower GI bleed and possible ischemic colitis.  CT of the abdomen and pelvis with diffuse diverticulosis with asymmetric area of focal inflammatory change in the ascending colon.  Hemoglobin stabilized around 9.0 and was asymptomatic by discharge.  Recommended 7 days of Augmentin and discharge with continued PPI and outpatient GI follow-up.  ER presentation with diffuse abdominal pain and diarrhea that was C. difficile negative, CT with resolution of inflammatory process but some jejunal inflammation noted and recommended restart Augmentin.  At his last visit he stated he was doing better but not 100% yet.  No more bleeding or diarrhea, but has had some stool changes with stools "not coming like they used to" and described as more pasty.  Dark stools on iron, on Protonix for GERD which is effective.  Intermittent abdominal pain not bad.  Intermittent nausea.  No other overt GI complaints.  At the end of the visit requested topical therapy for intermittent hemorrhoid symptoms.  Recommended update labs, colonoscopy, continue Protonix, Anusol rectal cream, follow-up in 3 months.  Labs completed 03/19/2020 with CBC demonstrating improved hemoglobin to 12.6, iron normalized, ferritin lower than 1 year prior but normal.  Most recent CBC 1 month ago with normal hemoglobin at 13.2.  Colonoscopy completed 04/01/2020 which found 7 total  colon polyps, diverticulosis in the entire colon, external and internal hemorrhoids.  Surgical pathology found the polyps to be tubular adenoma.  Recommended repeat colonoscopy in 3 years (2024).  Today he states he's doing well overall. GERD doing well on Protonix 40 mg daily. No breakthrough. Abdominal pain has resolved. Denies any abdominal pain, N/V, hematochezia, melena, fever, chills, unintentional weight loss. Blood sugars have been doing ok (last check was 130) on increase in glipizide. No longer on iron. Denies URI or flu-like symptoms. Denies loss of sense of taste or smell. The patient has received COVID-19 vaccination(s). Had an episode of chest pain, about 20 minutes at rest which self-resolved. Hasn't told PCP about this. Denies dyspnea, dizziness, lightheadedness, syncope, near syncope. Denies any other upper or lower GI symptoms.  Past Medical History:  Diagnosis Date  . Acute blood loss anemia 01/05/2020  . Acute kidney injury ([place])   . Acute respiratory failure with hypoxia ([place])   . Arthritis   . COPD (chronic obstructive pulmonary disease) ([place])   . Depression   . Diabetes mellitus   . Diabetes mellitus without complication ([place])   . Elevated PSA 10/01/2016  . Encounter for support and coordination of transition of care 01/14/2020  . Hypercholesterolemia   . Hyperlipidemia   . Hypertension   . Insomnia 01/05/2016  . Multiple lung nodules on CT 04/02/2015  . Nicotine addiction   . Obesity   . Oxygen deficiency    qhs  . Schizophrenia [facility])     Past Surgical History:  Procedure Laterality Date  .  CATARACT EXTRACTION W/PHACO Left 11/20/2013   Procedure: CATARACT EXTRACTION PHACO AND INTRAOCULAR LENS PLACEMENT (IOC);  Surgeon: Tonny Branch, MD;  Location: [facility];  Service: Ophthalmology;  Laterality: Left;  CDE:10.26  . CATARACT EXTRACTION W/PHACO Right 12/08/2013   Procedure: RIGHT EYE CATARACT EXTRACTION PHACO AND INTRAOCULAR LENS PLACEMENT ;  Surgeon: Tonny Branch, MD;   Location: [facility];  Service: Ophthalmology;  Laterality: Right;  CDE 12.38  . COLONOSCOPY N/A 04/01/2013   pancolonic diverticulosis, redundant colon, large internal hemorrhoids.   . COLONOSCOPY  2014   INCOMPLETE PREP IN R COLON  . COLONOSCOPY N/A 04/01/2020   Procedure: COLONOSCOPY;  Surgeon: Danie Binder, MD;  Location: [facility];  Service: Endoscopy;  Laterality: N/A;  8:45am  . ESOPHAGOGASTRODUODENOSCOPY N/A 02/18/2016   stricture at GE junction, moderate erosive gastritis and mild non-erosive duodenitis. +H.pylori. Treated initially with Prevpac. Breath test to check for eradication was positive, and he was prescribed Pylera.   Marland Kitchen EYE SURGERY Left 11/2013   cataract extraction  . POLYPECTOMY  04/01/2020   Procedure: POLYPECTOMY;  Surgeon: Danie Binder, MD;  Location: [facility];  Service: Endoscopy;;  . SAVORY DILATION N/A 02/18/2016   Procedure: SAVORY DILATION;  Surgeon: Danie Binder, MD;  Location: [facility];  Service: Endoscopy;  Laterality: N/A;  . SHOULDER SURGERY      Current Outpatient Medications  Medication Sig Dispense Refill  . Accu-Chek Softclix Lancets lancets TEST BLOOD SUGAR ONCE DAILY AS DIRECTED. 100 each 5  . acetaminophen (TYLENOL) 500 MG tablet Take 500-1,000 mg by mouth daily as needed for headache. Takes mostly for HA about 2 x/week.     Marland Kitchen albuterol (VENTOLIN HFA) 108 (90 Base) MCG/ACT inhaler INHALE 2 PUFFS INTO THE LUNGS EVERY 6 HOURS AS NEEDED FOR WHEEZING OR SHORTNESS OF BREATH. 8.5 g 0  . Blood Glucose Monitoring Suppl (ACCU-CHEK AVIVA PLUS) w/Device KIT accucheck aviva meter Once daily testing DX e11.9 1 kit 0  . budesonide-formoterol (SYMBICORT) 80-4.5 MCG/ACT inhaler INHALE 2 PUFFS INTO LUNGS TWICE DAILY. 10.2 g 0  . busPIRone (BUSPAR) 7.5 MG tablet TAKE 1 TABLET BY MOUTH 3 TIMES DAILY. 270 tablet 0  . docusate sodium (COLACE) 100 MG capsule Take 100 mg by mouth daily as needed for mild constipation.     Marland Kitchen ezetimibe (ZETIA) 10 MG tablet Take 1  tablet (10 mg total) by mouth daily. 90 tablet 1  . glipiZIDE (GLUCOTROL XL) 10 MG 24 hr tablet Take 1 tablet (10 mg total) by mouth daily with breakfast. 30 tablet 3  . glucose blood (ACCU-CHEK AVIVA PLUS) test strip USE TO TEST ONCE DAILY 100 each 5  . ipratropium-albuterol (DUONEB) 0.5-2.5 (3) MG/3ML SOLN USE 1 VIAL BY NEBULIZATION EVERY 4 TO 6 HOURS (Patient taking differently: Inhale 3 mLs into the lungs every 4 (four) hours as needed (SOB). USE 1 VIAL BY NEBULIZATION EVERY 4 TO 6 HOURS) 1620 mL 1  . olmesartan (BENICAR) 40 MG tablet TAKE 1 TABLET BY MOUTH ONCE A DAY. (Patient taking differently: Take 40 mg by mouth daily. ) 90 tablet 0  . pantoprazole (PROTONIX) 40 MG tablet Take 1 tablet (40 mg total) by mouth daily. 30 tablet 3  . pravastatin (PRAVACHOL) 20 MG tablet TAKE (1) TABLET BY MOUTH AT BEDTIME FOR CHOLESTEROL. (Patient taking differently: Take 20 mg by mouth daily. TAKE (1) TABLET BY MOUTH AT BEDTIME FOR CHOLESTEROL.) 90 tablet 0  . risperiDONE (RISPERDAL) 1 MG tablet Take 1 tablet (1 mg total) by  mouth See admin instructions. TAKE (1) TABLET BY MOUTH AT BEDTIME WITH '4MG'$ . 90 tablet 0  . risperidone (RISPERDAL) 4 MG tablet TAKE ONE TABLET BY MOUTH IN THE EVENING. TAKE WITH '1MG'$  TABLET. 90 tablet 0  . traZODone (DESYREL) 50 MG tablet TAKE ONE TABLET BY MOUTH AT BEDTIME AS NEEDED FOR SLEEP. 90 tablet 0  . triamterene-hydrochlorothiazide (MAXZIDE-25) 37.5-25 MG tablet TAKE 1/2 TABLET BY MOUTH ONCE DAILY. 45 tablet 0  . UNABLE TO FIND Nebulizer mouthpiece and tubing x 1  DX COPD 1 each 0  . UNABLE TO FIND XL pullups for daily use DX Incontinence 100 each 11  . UNABLE TO FIND Nebulizer tubing x 1  DX J44.9 1 each 0   No current facility-administered medications for this visit.    Allergies as of 06/23/2020 - Review Complete 06/23/2020  Allergen Reaction Noted  . Sertraline hcl  04/17/2011  . Wellbutrin [bupropion] Other (See Comments) 07/29/2015    Family History  Problem  Relation Age of Onset  . Diabetes Mother   . Hypertension Mother   . Stroke Mother   . Diabetes Sister   . Heart disease Sister   . Kidney disease Father   . Drug abuse Brother   . Colon cancer Neg Hx   . Colon polyps Neg Hx     Social History   Socioeconomic History  . Marital status: Divorced    Spouse name: Not on file  . Number of children: 2  . Years of education: Not on file  . Highest education level: Not on file  Occupational History  . Occupation: retired from [employer]  . Smoking status: Current Every Day Smoker    Packs/day: 1.00    Years: 48.00    Pack years: 48.00    Types: Cigarettes    Last attempt to quit: 05/02/2017    Years since quitting: 3.1  . Smokeless tobacco: Never Used  Vaping Use  . Vaping Use: Never used  Substance and Sexual Activity  . Alcohol use: Not Currently    Alcohol/week: 0.0 standard drinks  . Drug use: No  . Sexual activity: Not Currently    Birth control/protection: None  Other Topics Concern  . Not on file  Social History Narrative   ** Merged History Encounter **       ** Merged History Encounter **       Social Determinants of Health   Financial Resource Strain:   . Difficulty of Paying Living Expenses:   Food Insecurity:   . Worried About Charity fundraiser in the Last Year:   . Arboriculturist in the Last Year:   Transportation Needs:   . Film/video editor (Medical):   Marland Kitchen Lack of Transportation (Non-Medical):   Physical Activity:   . Days of Exercise per Week:   . Minutes of Exercise per Session:   Stress:   . Feeling of Stress :   Social Connections:   . Frequency of Communication with Friends and Family:   . Frequency of Social Gatherings with Friends and Family:   . Attends Religious Services:   . Active Member of Clubs or Organizations:   . Attends Archivist Meetings:   Marland Kitchen Marital Status:     Subjective: Review of Systems  Constitutional: Negative for chills, fever,  malaise/fatigue and weight loss.  HENT: Negative for congestion and sore throat.   Respiratory: Negative for cough and shortness of breath.   Cardiovascular: Negative  for chest pain and palpitations.  Gastrointestinal: Negative for abdominal pain, blood in stool, diarrhea, melena, nausea and vomiting.  Musculoskeletal: Negative for joint pain and myalgias.  Skin: Negative for rash.  Neurological: Negative for dizziness and weakness.  Endo/Heme/Allergies: Does not bruise/bleed easily.  Psychiatric/Behavioral: Negative for depression. The patient is not nervous/anxious.   All other systems reviewed and are negative.    Objective: BP (!) 145/93   Pulse 89   Temp 97.7 F (36.5 C) (Oral)   Ht 6' 1" (1.854 m)   Wt 237 lb 3.2 oz (107.6 kg)   BMI 31.29 kg/m  Physical Exam Vitals and nursing note reviewed.  Constitutional:      General: He is not in acute distress.    Appearance: Normal appearance. He is well-developed. He is obese. He is not ill-appearing, toxic-appearing or diaphoretic.  HENT:     Head: Normocephalic and atraumatic.     Nose: No congestion or rhinorrhea.  Eyes:     General: No scleral icterus. Cardiovascular:     Rate and Rhythm: Normal rate and regular rhythm.     Heart sounds: Normal heart sounds.  Pulmonary:     Effort: Pulmonary effort is normal.     Breath sounds: Normal breath sounds.  Abdominal:     General: Bowel sounds are normal. There is no distension.     Palpations: Abdomen is soft. There is no hepatomegaly, splenomegaly or mass.     Tenderness: There is no abdominal tenderness. There is no guarding or rebound.     Hernia: No hernia is present.  Musculoskeletal:     Cervical back: Neck supple.  Skin:    General: Skin is warm and dry.     Coloration: Skin is not jaundiced.     Findings: No bruising or rash.  Neurological:     General: No focal deficit present.     Mental Status: He is alert and oriented to person, place, and time. Mental  status is at baseline.  Psychiatric:        Mood and Affect: Mood normal.        Behavior: Behavior normal.        Thought Content: Thought content normal.       06/23/2020 9:24 AM   Disclaimer: This note was dictated with voice recognition software. Similar sounding words can inadvertently be transcribed and may not be corrected upon review.

## 2020-06-23 NOTE — Patient Instructions (Signed)
Your health issues we discussed today were:   GERD (reflux/heartburn): 1. Continue taking your Protonix 2. If you have breakthrough heartburn symptoms for a day or 2 you can use Tums, Rolaids, or other over-the-counter medication 3. Call us for any worsening or severe symptoms  Abdominal pain: 1. I am glad your abdominal pain has improved, temporarily resolved 2. Keep an eye on this and let us know if you have any recurrent pain especially if it is associated with bleeding  GI bleed and anemia: 1. As we discussed, your hemoglobin (sign of blood loss) is now normal 2. Let us know if you have any further bleeding  Overall I recommend:  1. Continue your other current medications 2. Return for follow-up in 6 months 3. Call your primary care to notify them of your brief episode of chest pain last week 4. Call us if you have any questions or concerns   ---------------------------------------------------------------  I am glad you have gotten your COVID-19 vaccination!  Even though you are fully vaccinated you should continue to follow CDC and state/local guidelines.  ---------------------------------------------------------------   At [facility] we value your feedback. You may receive a survey about your visit today. Please share your experience as we strive to create trusting relationships with our patients to provide genuine, compassionate, quality care.  We appreciate your understanding and patience as we review any laboratory studies, imaging, and other diagnostic tests that are ordered as we care for you. Our office policy is 5 business days for review of these results, and any emergent or urgent results are addressed in a timely manner for your best interest. If you do not hear from our office in 1 week, please contact us.   We also encourage the use of MyChart, which contains your medical information for your review as well. If you are not enrolled in this feature,  an access code is on this after visit summary for your convenience. Thank you for allowing [place] to be involved in your care.  It was great to see you today!  I hope you have a great Summer!!

## 2020-06-23 NOTE — Assessment & Plan Note (Signed)
GERD is doing well on Protonix 40 mg daily.  Has not had any breakthrough recently, not requiring rescue meds.  Continue to monitor and notify us of any recurrent or severe symptoms.  Follow-up in 6 months.

## 2020-06-23 NOTE — Assessment & Plan Note (Signed)
Last labs demonstrate hemoglobin is normalized after acute GI bleed requiring hospitalization.  He is no longer on iron.  Recommend he notify us of any recurrent bleeding.  Follow-up in 6 months otherwise.

## 2020-06-25 ENCOUNTER — Other Ambulatory Visit: Payer: Self-pay | Admitting: *Deleted

## 2020-06-25 MED ORDER — IPRATROPIUM-ALBUTEROL 0.5-2.5 (3) MG/3ML IN SOLN: RESPIRATORY_TRACT | 1 refills | Status: DC

## 2020-06-28 ENCOUNTER — Ambulatory Visit (INDEPENDENT_AMBULATORY_CARE_PROVIDER_SITE_OTHER): Payer: Medicare Other | Admitting: Podiatry

## 2020-06-28 ENCOUNTER — Other Ambulatory Visit: Payer: Self-pay

## 2020-06-28 ENCOUNTER — Ambulatory Visit: Payer: Medicare Other | Admitting: Podiatry

## 2020-06-28 DIAGNOSIS — Q828 Other specified congenital malformations of skin: Secondary | ICD-10-CM | POA: Diagnosis not present

## 2020-06-28 DIAGNOSIS — E1140 Type 2 diabetes mellitus with diabetic neuropathy, unspecified: Secondary | ICD-10-CM

## 2020-06-28 DIAGNOSIS — B351 Tinea unguium: Secondary | ICD-10-CM | POA: Diagnosis not present

## 2020-06-28 DIAGNOSIS — M79676 Pain in unspecified toe(s): Secondary | ICD-10-CM

## 2020-06-28 DIAGNOSIS — E1149 Type 2 diabetes mellitus with other diabetic neurological complication: Secondary | ICD-10-CM | POA: Diagnosis not present

## 2020-06-28 DIAGNOSIS — M2142 Flat foot [pes planus] (acquired), left foot: Secondary | ICD-10-CM

## 2020-06-28 DIAGNOSIS — M2141 Flat foot [pes planus] (acquired), right foot: Secondary | ICD-10-CM

## 2020-06-29 ENCOUNTER — Telehealth: Payer: Self-pay

## 2020-06-29 NOTE — Telephone Encounter (Signed)
Please advise what he can take otc for cough

## 2020-06-29 NOTE — Telephone Encounter (Signed)
May take sugar free robitussin dM

## 2020-06-29 NOTE — Progress Notes (Signed)
Subjective: 69 y.o. returns the office today for painful, elongated, thickened toenails which he cannot trim himself and for painful calluses. Denies any redness or drainage around the nails. Denies any acute changes since last appointment and no new complaints today. Denies any systemic complaints such as fevers, chills, nausea, vomiting.   PCP: Fayrene Helper, MD  A1c: 7.8  Objective: AAO 3, NAD DP/PT pulses palpable, CRT less than 3 seconds Protective sensation intact with Derrel Nip monofilament Nails hypertrophic, dystrophic, elongated, brittle, discolored 10. There is tenderness overlying the nails 1-5 bilaterally. There is no surrounding erythema or drainage along the nail sites. Thick hyperkeratotic preulcerative lesions are noted fifth metatarsal base bilaterally.  No ongoing ulceration drainage or signs of infection. Flatfoot present No open lesions or pre-ulcerative lesions are identified. No other areas of tenderness bilateral lower extremities. No overlying edema, erythema, increased warmth. No pain with calf compression, swelling, warmth, erythema.  Assessment: Patient presents with symptomatic onychomycosis; preulcerative calluses  Plan: -Treatment options including alternatives, risks, complications were discussed -Nails sharply debrided 10 without complication/bleeding. -Hyperkeratotic lesion sharp debrided x2 without any complications or bleeding -Previously diagnosed with neuropathy-given this as well as preulcerative calluses, flat feet will have him follow-up for diabetic shoes as he was asking for them. -Discussed daily foot inspection. If there are any changes, to call the office immediately.  -Follow-up in 3 months or sooner if any problems are to arise. In the meantime, encouraged to call the office with any questions, concerns, changes symptoms.  Celesta Gentile, DPM

## 2020-06-29 NOTE — Telephone Encounter (Signed)
Pt is calling to confirm appts, and to advise that he is coughing here in there, wants to know what he can take over the counter  Please call

## 2020-06-29 NOTE — Telephone Encounter (Signed)
Spoke with patient and he is aware of which cough syrup to buy.

## 2020-07-01 ENCOUNTER — Telehealth: Payer: Self-pay

## 2020-07-01 ENCOUNTER — Other Ambulatory Visit: Payer: Self-pay

## 2020-07-01 DIAGNOSIS — J449 Chronic obstructive pulmonary disease, unspecified: Secondary | ICD-10-CM | POA: Diagnosis not present

## 2020-07-01 MED ORDER — OLMESARTAN MEDOXOMIL 40 MG PO TABS: 40.0000 mg | ORAL_TABLET | Freq: Every day | ORAL | 0 refills | Status: DC

## 2020-07-01 NOTE — Telephone Encounter (Signed)
Prescription sent in  

## 2020-07-01 NOTE — Telephone Encounter (Signed)
Please send in olmesartan (BENICAR) 40 MG tablet to [facility]

## 2020-07-05 ENCOUNTER — Other Ambulatory Visit: Payer: Self-pay | Admitting: Family Medicine

## 2020-07-05 ENCOUNTER — Telehealth: Payer: Self-pay | Admitting: Internal Medicine

## 2020-07-05 DIAGNOSIS — J449 Chronic obstructive pulmonary disease, unspecified: Secondary | ICD-10-CM

## 2020-07-05 NOTE — Telephone Encounter (Signed)
Pt states he does not know when his last bm was. He state it was one day last week. Pt denies any abd pain and he has taken a laxatives plus 2 stool softeners and  neither have helped. Pt denies nausea or vomiting and wants to know what he can do to have a bm

## 2020-07-05 NOTE — Telephone Encounter (Signed)
Pt saw EG on 7/7 and said he hasn't had a BM in a week and would should he do. Please advise. [phone]

## 2020-07-06 ENCOUNTER — Other Ambulatory Visit: Payer: Self-pay | Admitting: Family Medicine

## 2020-07-06 ENCOUNTER — Other Ambulatory Visit: Payer: Self-pay | Admitting: Nurse Practitioner

## 2020-07-06 DIAGNOSIS — K219 Gastro-esophageal reflux disease without esophagitis: Secondary | ICD-10-CM

## 2020-07-06 DIAGNOSIS — J449 Chronic obstructive pulmonary disease, unspecified: Secondary | ICD-10-CM

## 2020-07-06 DIAGNOSIS — K649 Unspecified hemorrhoids: Secondary | ICD-10-CM

## 2020-07-06 DIAGNOSIS — K922 Gastrointestinal hemorrhage, unspecified: Secondary | ICD-10-CM

## 2020-07-06 DIAGNOSIS — R1084 Generalized abdominal pain: Secondary | ICD-10-CM

## 2020-07-07 NOTE — Telephone Encounter (Signed)
Please ask him if he has taken MiraLAX? Any BM since his message?

## 2020-07-07 NOTE — Telephone Encounter (Signed)
Called pt and he stated he is not currently taken miralx but he did have a good normal bm yesterday and feels much better

## 2020-07-12 NOTE — Telephone Encounter (Signed)
Glad to hear. Call with further problems

## 2020-07-13 NOTE — Telephone Encounter (Signed)
noted 

## 2020-07-15 ENCOUNTER — Encounter: Payer: Medicare Other | Admitting: Family Medicine

## 2020-07-21 ENCOUNTER — Ambulatory Visit: Payer: Medicare Other | Admitting: Urology

## 2020-07-27 ENCOUNTER — Other Ambulatory Visit: Payer: Self-pay

## 2020-07-27 ENCOUNTER — Telehealth (INDEPENDENT_AMBULATORY_CARE_PROVIDER_SITE_OTHER): Payer: Medicare Other

## 2020-07-27 VITALS — BP 144/90 | Ht 73.0 in | Wt 230.0 lb

## 2020-07-27 DIAGNOSIS — Z0000 Encounter for general adult medical examination without abnormal findings: Secondary | ICD-10-CM

## 2020-07-27 NOTE — Progress Notes (Signed)
Subjective:   Matthew Molina is a 69 y.o. male who presents for Medicare Annual/Subsequent preventive examination.  Review of Systems     Cardiac Risk Factors include: advanced age (>26mn, >>82women);diabetes mellitus;dyslipidemia;hypertension;male gender;obesity (BMI >30kg/m2);sedentary lifestyle     Objective:    Today's Vitals   07/27/20 1447 07/27/20 1448  BP: (!) 144/90   Weight: 230 lb (104.3 kg)   Height: '6\' 1"'$  (1.854 m)   PainSc: 0-No pain 0-No pain   Body mass index is 30.34 kg/m.  Advanced Directives 07/27/2020 04/01/2020 01/06/2020 04/17/2019 04/17/2019 02/11/2019 01/31/2019  Does Patient Have a Medical Advance Directive? No No No No No No No  Would patient like information on creating a medical advance directive? Yes (ED - Information included in AVS) No - Patient declined No - Patient declined No - Patient declined - - No - Patient declined  Pre-existing out of facility DNR order (yellow form or pink MOST form) - - - - - - -    Current Medications (verified) Outpatient Encounter Medications as of 07/27/2020  Medication Sig  . Accu-Chek Softclix Lancets lancets TEST BLOOD SUGAR ONCE DAILY AS DIRECTED.  .Marland Kitchenacetaminophen (TYLENOL) 500 MG tablet Take 500-1,000 mg by mouth daily as needed for headache. Takes mostly for HA about 2 x/week.   .Marland Kitchenalbuterol (VENTOLIN HFA) 108 (90 Base) MCG/ACT inhaler INHALE 2 PUFFS INTO THE LUNGS EVERY 6 HOURS AS NEEDED FOR WHEEZING OR SHORTNESS OF BREATH.  .Marland KitchenBlood Glucose Monitoring Suppl (ACCU-CHEK AVIVA PLUS) w/Device KIT accucheck aviva meter Once daily testing DX e11.9  . budesonide-formoterol (SYMBICORT) 80-4.5 MCG/ACT inhaler INHALE 2 PUFFS INTO LUNGS TWICE DAILY.  . busPIRone (BUSPAR) 7.5 MG tablet TAKE 1 TABLET BY MOUTH 3 TIMES DAILY.  .Marland Kitchendocusate sodium (COLACE) 100 MG capsule Take 100 mg by mouth daily as needed for mild constipation.   .Marland Kitchenezetimibe (ZETIA) 10 MG tablet Take 1 tablet (10 mg total) by mouth daily.  .Marland KitchenglipiZIDE (GLUCOTROL  XL) 10 MG 24 hr tablet Take 1 tablet (10 mg total) by mouth daily with breakfast.  . glucose blood (ACCU-CHEK AVIVA PLUS) test strip USE TO TEST ONCE DAILY  . ipratropium-albuterol (DUONEB) 0.5-2.5 (3) MG/3ML SOLN USE 1 VIAL BY NEBULIZATION EVERY 4 TO 6 HOURS  . olmesartan (BENICAR) 40 MG tablet Take 1 tablet (40 mg total) by mouth daily.  . pantoprazole (PROTONIX) 40 MG tablet TAKE ONE TABLET BY MOUTHONCE DAILY.  . pravastatin (PRAVACHOL) 20 MG tablet TAKE (1) TABLET BY MOUTH AT BEDTIME FOR CHOLESTEROL. (Patient taking differently: Take 20 mg by mouth daily. TAKE (1) TABLET BY MOUTH AT BEDTIME FOR CHOLESTEROL.)  . risperiDONE (RISPERDAL) 1 MG tablet Take 1 tablet (1 mg total) by mouth See admin instructions. TAKE (1) TABLET BY MOUTH AT BEDTIME WITH '4MG'$ .  . risperidone (RISPERDAL) 4 MG tablet TAKE ONE TABLET BY MOUTH IN THE EVENING. TAKE WITH '1MG'$  TABLET.  .Marland KitchentraZODone (DESYREL) 50 MG tablet TAKE ONE TABLET BY MOUTH AT BEDTIME AS NEEDED FOR SLEEP.  .Marland Kitchentriamterene-hydrochlorothiazide (MAXZIDE-25) 37.5-25 MG tablet TAKE 1/2 TABLET BY MOUTH ONCE DAILY.  .Marland KitchenUNABLE TO FIND Nebulizer mouthpiece and tubing x 1  DX COPD  . UNABLE TO FIND XL pullups for daily use DX Incontinence  . UNABLE TO FIND Nebulizer tubing x 1  DX J44.9  . [DISCONTINUED] sildenafil (VIAGRA) 100 MG tablet Take 100 mg by mouth. Take one tablet 30 mins before intercourse    No facility-administered encounter medications on file as of 07/27/2020.  Allergies (verified) Sertraline hcl and Wellbutrin [bupropion]   History: Past Medical History:  Diagnosis Date  . Acute blood loss anemia 01/05/2020  . Acute kidney injury ([place])   . Acute respiratory failure with hypoxia ([place])   . Arthritis   . COPD (chronic obstructive pulmonary disease) ([place])   . Depression   . Diabetes mellitus   . Diabetes mellitus without complication ([place])   . Elevated PSA 10/01/2016  . Encounter for support and coordination of transition of care 01/14/2020   . Hypercholesterolemia   . Hyperlipidemia   . Hypertension   . Insomnia 01/05/2016  . Multiple lung nodules on CT 04/02/2015  . Nicotine addiction   . Obesity   . Oxygen deficiency    qhs  . Schizophrenia [facility])    Past Surgical History:  Procedure Laterality Date  . CATARACT EXTRACTION W/PHACO Left 11/20/2013   Procedure: CATARACT EXTRACTION PHACO AND INTRAOCULAR LENS PLACEMENT (IOC);  Surgeon: Tonny Branch, MD;  Location: [facility] OR[facility];  Service: Ophthalmology;  Laterality: Left;  CDE:10.26  . CATARACT EXTRACTION W/PHACO Right 12/08/2013   Procedure: RIGHT EYE CATARACT EXTRACTION PHACO AND INTRAOCULAR LENS PLACEMENT ;  Surgeon: Tonny Branch, MD;  Location: [facility];  Service: Ophthalmology;  Laterality: Right;  CDE 12.38  . COLONOSCOPY N/A 04/01/2013   pancolonic diverticulosis, redundant colon, large internal hemorrhoids.   . COLONOSCOPY  2014   INCOMPLETE PREP IN R COLON  . COLONOSCOPY N/A 04/01/2020   Procedure: COLONOSCOPY;  Surgeon: Danie Binder, MD;  Location: [facility];  Service: Endoscopy;  Laterality: N/A;  8:45am  . ESOPHAGOGASTRODUODENOSCOPY N/A 02/18/2016   stricture at GE junction, moderate erosive gastritis and mild non-erosive duodenitis. +H.pylori. Treated initially with Prevpac. Breath test to check for eradication was positive, and he was prescribed Pylera.   Marland Kitchen EYE SURGERY Left 11/2013   cataract extraction  . POLYPECTOMY  04/01/2020   Procedure: POLYPECTOMY;  Surgeon: Danie Binder, MD;  Location: [facility];  Service: Endoscopy;;  . SAVORY DILATION N/A 02/18/2016   Procedure: SAVORY DILATION;  Surgeon: Danie Binder, MD;  Location: [facility];  Service: Endoscopy;  Laterality: N/A;  . SHOULDER SURGERY     Family History  Problem Relation Age of Onset  . Diabetes Mother   . Hypertension Mother   . Stroke Mother   . Diabetes Sister   . Heart disease Sister   . Kidney disease Father   . Drug abuse Brother   . Colon cancer Neg Hx   . Colon polyps Neg Hx     Social History   Socioeconomic History  . Marital status: Divorced    Spouse name: Not on file  . Number of children: 2  . Years of education: Not on file  . Highest education level: Not on file  Occupational History  . Occupation: retired from [employer]  . Smoking status: Current Every Day Smoker    Packs/day: 1.00    Years: 48.00    Pack years: 48.00    Types: Cigarettes    Last attempt to quit: 05/02/2017    Years since quitting: 3.2  . Smokeless tobacco: Never Used  Vaping Use  . Vaping Use: Never used  Substance and Sexual Activity  . Alcohol use: Not Currently    Alcohol/week: 0.0 standard drinks  . Drug use: No  . Sexual activity: Not Currently    Birth control/protection: None  Other Topics Concern  . Not on file  Social History Narrative   **  Merged History Encounter **       ** Merged History Encounter **       Social Determinants of Health   Financial Resource Strain: Low Risk   . Difficulty of Paying Living Expenses: Not very hard  Food Insecurity: No Food Insecurity  . Worried About Charity fundraiser in the Last Year: Never true  . Ran Out of Food in the Last Year: Never true  Transportation Needs: No Transportation Needs  . Lack of Transportation (Medical): No  . Lack of Transportation (Non-Medical): No  Physical Activity: Insufficiently Active  . Days of Exercise per Week: 2 days  . Minutes of Exercise per Session: 10 min  Stress: No Stress Concern Present  . Feeling of Stress : Not at all  Social Connections: Socially Isolated  . Frequency of Communication with Friends and Family: More than three times a week  . Frequency of Social Gatherings with Friends and Family: Three times a week  . Attends Religious Services: Never  . Active Member of Clubs or Organizations: No  . Attends Archivist Meetings: Never  . Marital Status: Divorced    Tobacco Counseling Ready to quit: Not Answered Counseling given: Not  Answered   Clinical Intake:  Pre-visit preparation completed: Yes  Pain : No/denies pain Pain Score: 0-No pain     Nutritional Status: BMI > 30  Obese Diabetes: Yes CBG done?: No Did pt. bring in CBG monitor from home?: No  How often do you need to have someone help you when you read instructions, pamphlets, or other written materials from your doctor or pharmacy?: 3 - Sometimes What is the last grade level you completed in school?: 12 th grade  Diabetic? Yes   Interpreter Needed?: No    Information entered by :: Zenda Herskowitz LPN   Activities of Daily Living In your present state of health, do you have any difficulty performing the following activities: 07/27/2020 01/06/2020  Hearing? N N  Vision? N N  Difficulty concentrating or making decisions? N Y  Walking or climbing stairs? Y N  Dressing or bathing? Y N  Doing errands, shopping? Y N  Preparing Food and eating ? Y -  Using the Toilet? N -  In the past six months, have you accidently leaked urine? N -  Do you have problems with loss of bowel control? N -  Managing your Medications? N -  Managing your Finances? N -  Housekeeping or managing your Housekeeping? Y -  Comment aide helps with this -  Some recent data might be hidden    Patient Care Team: Fayrene Helper, MD as PCP - General (Family Medicine) Fay Records, MD as Consulting Physician (Cardiology) Fayrene Helper, MD Fayrene Helper, MD (Family Medicine) Danie Binder, MD (Inactive) as Consulting Physician (Gastroenterology) Gardiner Barefoot, DPM as Consulting Physician (Podiatry) Madelin Headings, DO (Optometry) Irine Seal, MD as Attending Physician (Urology)  Indicate any recent Medical Services you may have received from other than [facility] providers in the past year (date may be approximate).     Assessment:   This is a routine wellness examination for [place].  Hearing/Vision screen No exam data present  Dietary issues and exercise  activities discussed: Current Exercise Habits: Home exercise routine, Time (Minutes): 10, Frequency (Times/Week): 2, Weekly Exercise (Minutes/Week): 20, Intensity: Mild  Goals    . Exercise 3x per week (30 min per time)     Recommend starting a routine exercise program at  least 3 days a week for 30-45 minutes at a time as tolerated.        Depression Screen PHQ 2/9 Scores 07/27/2020 05/11/2020 01/22/2020 01/14/2020 01/09/2020 10/08/2019 08/11/2019  PHQ - 2 Score 0 0 1 1 0 0 0  PHQ- 9 Score - 3 - - - - -  Exception Documentation - - - - - - -    Fall Risk Fall Risk  07/27/2020 05/26/2020 05/11/2020 01/22/2020 01/14/2020  Falls in the past year? 0 0 0 0 1  Number falls in past yr: 0 0 0 0 1  Injury with Fall? 0 0 0 0 0  Risk for fall due to : - - No Fall Risks - -  Risk for fall due to: Comment - - - - -  Follow up - - Falls evaluation completed - -    Any stairs in or around the home? No  If so, are there any without handrails? No  Home free of loose throw rugs in walkways, pet beds, electrical cords, etc? Yes  Adequate lighting in your home to reduce risk of falls? Yes   ASSISTIVE DEVICES UTILIZED TO PREVENT FALLS:  Life alert? Yes  Use of a cane, walker or w/c? Yes  Grab bars in the bathroom? No  Shower chair or bench in shower? Yes  Elevated toilet seat or a handicapped toilet? Yes   TIMED UP AND GO:  Was the test performed? .  Length of time to ambulate 10 feet:  sec.     Cognitive Function:     6CIT Screen 07/27/2020 07/15/2019 07/08/2018 05/09/2017  What Year? 0 points 0 points 0 points 0 points  What month? 0 points 0 points 0 points 0 points  What time? 0 points 0 points 0 points 0 points  Count back from 20 0 points 0 points 0 points 0 points  Months in reverse 0 points 0 points 0 points 0 points  Repeat phrase 2 points 0 points 2 points 0 points  Total Score 2 0 2 0    Immunizations Immunization History  Administered Date(s) Administered  . Fluad Quad(high Dose  65+) 10/08/2019  . Influenza Split 09/02/2012  . Influenza Whole 09/19/2007, 09/23/2009, 10/07/2010, 08/28/2011, 09/02/2015  . Influenza, High Dose Seasonal PF 09/18/2018  . Influenza,inj,Quad PF,6+ Mos 09/02/2013, 11/30/2014, 08/30/2015, 09/27/2016, 12/04/2017  . Moderna SARS-COVID-2 Vaccination 02/12/2020, 03/23/2020  . Pneumococcal Conjugate-13 06/16/2014  . Pneumococcal Polysaccharide-23 06/28/2004, 08/27/2009, 04/17/2011, 05/04/2016  . Td 06/28/2004  . Zoster Recombinat (Shingrix) 06/04/2018, 06/04/2018    TDAP status: Due, Education has been provided regarding the importance of this vaccine. Advised may receive this vaccine at local pharmacy or Health Dept. Aware to provide a copy of the vaccination record if obtained from local pharmacy or Health Dept. Verbalized acceptance and understanding. Flu Vaccine status: Up to date Pneumococcal vaccine status: Up to date Covid-19 vaccine status: Completed vaccines  Qualifies for Shingles Vaccine? Yes   Zostavax completed Yes   Shingrix Completed?: Yes  Screening Tests Health Maintenance  Topic Date Due  . OPHTHALMOLOGY EXAM  10/16/2019  . INFLUENZA VACCINE  07/18/2020  . FOOT EXAM  10/07/2020  . HEMOGLOBIN A1C  11/26/2020  . TETANUS/TDAP  02/17/2027  . COLONOSCOPY  04/01/2030  . COVID-19 Vaccine  Completed  . Hepatitis C Screening  Completed  . PNA vac Low Risk Adult  Completed    Health Maintenance  Health Maintenance Due  Topic Date Due  . OPHTHALMOLOGY EXAM  10/16/2019  . INFLUENZA  VACCINE  07/18/2020    Colorectal cancer screening: Completed 2021. Repeat every 3 years  Lung Cancer Screening: (Low Dose CT Chest recommended if Age 63-80 years, 30 pack-year currently smoking OR have quit w/in 15years.) does qualify.   Lung Cancer Screening Referral: up to date  Additional Screening:  Hepatitis C Screening: does qualify; Completed 2017  Vision Screening: Recommended annual ophthalmology exams for early detection of  glaucoma and other disorders of the eye. Is the patient up to date with their annual eye exam?  yes- states he had done this year Who is the provider or what is the name of the office in which the patient attends annual eye exams? [email or web address] If pt is not established with a provider, would they like to be referred to a provider to establish care? No .   Dental Screening: Recommended annual dental exams for proper oral hygiene  Community Resource Referral / Chronic Care Management: CRR required this visit?  No   CCM required this visit?  No      Plan:     I have personally reviewed and noted the following in the patient's chart:   . Medical and social history . Use of alcohol, tobacco or illicit drugs  . Current medications and supplements . Functional ability and status . Nutritional status . Physical activity . Advanced directives . List of other physicians . Hospitalizations, surgeries, and ER visits in previous 12 months . Vitals . Screenings to include cognitive, depression, and falls . Referrals and appointments  In addition, I have reviewed and discussed with patient certain preventive protocols, quality metrics, and best practice recommendations. A written personalized care plan for preventive services as well as general preventive health recommendations were provided to patient.     Kate Sable, LPN, LPN   06/07/3085   Nurse Notes: visit performed by telephone. Patient at his home, provider in the office. Time spent with patient 30 mins

## 2020-07-27 NOTE — Patient Instructions (Signed)
Matthew Molina , Thank you for taking time to come for your Medicare Wellness Visit. I appreciate your ongoing commitment to your health goals. Please review the following plan we discussed and let me know if I can assist you in the future.    Screening recommendations/referrals: Colonoscopy: Due again in 2024 Recommended yearly ophthalmology/optometry visit for glaucoma screening and checkup Recommended yearly dental visit for hygiene and checkup  Vaccinations: Influenza vaccine: Due in the fall 2021 Pneumococcal vaccine: up to date Tdap vaccine: not covered by medicare Shingles vaccine: already received at pharmacy in 2019     Next appointment: wellness in 1 year   Preventive Care 70 Years and Older, Male Preventive care refers to lifestyle choices and visits with your health care provider that can promote health and wellness. What does preventive care include?  A yearly physical exam. This is also called an annual well check.  Dental exams once or twice a year.  Routine eye exams. Ask your health care provider how often you should have your eyes checked.  Personal lifestyle choices, including:  Daily care of your teeth and gums.  Regular physical activity.  Eating a healthy diet.  Avoiding tobacco and drug use.  Limiting alcohol use.  Practicing safe sex.  Taking low doses of aspirin every day.  Taking vitamin and mineral supplements as recommended by your health care provider. What happens during an annual well check? The services and screenings done by your health care provider during your annual well check will depend on your age, overall health, lifestyle risk factors, and family history of disease. Counseling  Your health care provider may ask you questions about your:  Alcohol use.  Tobacco use.  Drug use.  Emotional well-being.  Home and relationship well-being.  Sexual activity.  Eating habits.  History of falls.  Memory and ability to  understand (cognition).  Work and work Statistician. Screening  You may have the following tests or measurements:  Height, weight, and BMI.  Blood pressure.  Lipid and cholesterol levels. These may be checked every 5 years, or more frequently if you are over 15 years old.  Skin check.  Lung cancer screening. You may have this screening every year starting at age 70 if you have a 30-pack-year history of smoking and currently smoke or have quit within the past 15 years.  Fecal occult blood test (FOBT) of the stool. You may have this test every year starting at age 70.  Flexible sigmoidoscopy or colonoscopy. You may have a sigmoidoscopy every 5 years or a colonoscopy every 10 years starting at age 70.  Prostate cancer screening. Recommendations will vary depending on your family history and other risks.  Hepatitis C blood test.  Hepatitis B blood test.  Sexually transmitted disease (STD) testing.  Diabetes screening. This is done by checking your blood sugar (glucose) after you have not eaten for a while (fasting). You may have this done every 1-3 years.  Abdominal aortic aneurysm (AAA) screening. You may need this if you are a current or former smoker.  Osteoporosis. You may be screened starting at age 70 if you are at high risk. Talk with your health care provider about your test results, treatment options, and if necessary, the need for more tests. Vaccines  Your health care provider may recommend certain vaccines, such as:  Influenza vaccine. This is recommended every year.  Tetanus, diphtheria, and acellular pertussis (Tdap, Td) vaccine. You may need a Td booster every 10 years.  Zoster vaccine.  You may need this after age 70.  Pneumococcal 13-valent conjugate (PCV13) vaccine. One dose is recommended after age 70.  Pneumococcal polysaccharide (PPSV23) vaccine. One dose is recommended after age 70. Talk to your health care provider about which screenings and vaccines you  need and how often you need them. This information is not intended to replace advice given to you by your health care provider. Make sure you discuss any questions you have with your health care provider. Document Released: 12/31/2015 Document Revised: 08/23/2016 Document Reviewed: 10/05/2015 Elsevier Interactive Patient Education  2017 [place] Prevention in the Home Falls can cause injuries. They can happen to people of all ages. There are many things you can do to make your home safe and to help prevent falls. What can I do on the outside of my home?  Regularly fix the edges of walkways and driveways and fix any cracks.  Remove anything that might make you trip as you walk through a door, such as a raised step or threshold.  Trim any bushes or trees on the path to your home.  Use bright outdoor lighting.  Clear any walking paths of anything that might make someone trip, such as rocks or tools.  Regularly check to see if handrails are loose or broken. Make sure that both sides of any steps have handrails.  Any raised decks and porches should have guardrails on the edges.  Have any leaves, snow, or ice cleared regularly.  Use sand or salt on walking paths during winter.  Clean up any spills in your garage right away. This includes oil or grease spills. What can I do in the bathroom?  Use night lights.  Install grab bars by the toilet and in the tub and shower. Do not use towel bars as grab bars.  Use non-skid mats or decals in the tub or shower.  If you need to sit down in the shower, use a plastic, non-slip stool.  Keep the floor dry. Clean up any water that spills on the floor as soon as it happens.  Remove soap buildup in the tub or shower regularly.  Attach bath mats securely with double-sided non-slip rug tape.  Do not have throw rugs and other things on the floor that can make you trip. What can I do in the bedroom?  Use night lights.  Make sure  that you have a light by your bed that is easy to reach.  Do not use any sheets or blankets that are too big for your bed. They should not hang down onto the floor.  Have a firm chair that has side arms. You can use this for support while you get dressed.  Do not have throw rugs and other things on the floor that can make you trip. What can I do in the kitchen?  Clean up any spills right away.  Avoid walking on wet floors.  Keep items that you use a lot in easy-to-reach places.  If you need to reach something above you, use a strong step stool that has a grab bar.  Keep electrical cords out of the way.  Do not use floor polish or wax that makes floors slippery. If you must use wax, use non-skid floor wax.  Do not have throw rugs and other things on the floor that can make you trip. What can I do with my stairs?  Do not leave any items on the stairs.  Make sure that there are handrails on  both sides of the stairs and use them. Fix handrails that are broken or loose. Make sure that handrails are as long as the stairways.  Check any carpeting to make sure that it is firmly attached to the stairs. Fix any carpet that is loose or worn.  Avoid having throw rugs at the top or bottom of the stairs. If you do have throw rugs, attach them to the floor with carpet tape.  Make sure that you have a light switch at the top of the stairs and the bottom of the stairs. If you do not have them, ask someone to add them for you. What else can I do to help prevent falls?  Wear shoes that:  Do not have high heels.  Have rubber bottoms.  Are comfortable and fit you well.  Are closed at the toe. Do not wear sandals.  If you use a stepladder:  Make sure that it is fully opened. Do not climb a closed stepladder.  Make sure that both sides of the stepladder are locked into place.  Ask someone to hold it for you, if possible.  Clearly mark and make sure that you can see:  Any grab bars or  handrails.  First and last steps.  Where the edge of each step is.  Use tools that help you move around (mobility aids) if they are needed. These include:  Canes.  Walkers.  Scooters.  Crutches.  Turn on the lights when you go into a dark area. Replace any light bulbs as soon as they burn out.  Set up your furniture so you have a clear path. Avoid moving your furniture around.  If any of your floors are uneven, fix them.  If there are any pets around you, be aware of where they are.  Review your medicines with your doctor. Some medicines can make you feel dizzy. This can increase your chance of falling. Ask your doctor what other things that you can do to help prevent falls. This information is not intended to replace advice given to you by your health care provider. Make sure you discuss any questions you have with your health care provider. Document Released: 09/30/2009 Document Revised: 05/11/2016 Document Reviewed: 01/08/2015 Elsevier Interactive Patient Education  2017 [employer].

## 2020-07-30 ENCOUNTER — Other Ambulatory Visit: Payer: Self-pay

## 2020-07-30 ENCOUNTER — Ambulatory Visit: Payer: Medicare Other | Admitting: Orthotics

## 2020-07-30 DIAGNOSIS — M2141 Flat foot [pes planus] (acquired), right foot: Secondary | ICD-10-CM

## 2020-07-30 DIAGNOSIS — B351 Tinea unguium: Secondary | ICD-10-CM

## 2020-07-30 DIAGNOSIS — E1140 Type 2 diabetes mellitus with diabetic neuropathy, unspecified: Secondary | ICD-10-CM

## 2020-07-30 NOTE — Progress Notes (Signed)

## 2020-08-01 DIAGNOSIS — J449 Chronic obstructive pulmonary disease, unspecified: Secondary | ICD-10-CM | POA: Diagnosis not present

## 2020-08-11 ENCOUNTER — Other Ambulatory Visit: Payer: Self-pay

## 2020-08-11 ENCOUNTER — Ambulatory Visit (INDEPENDENT_AMBULATORY_CARE_PROVIDER_SITE_OTHER): Payer: Medicare Other | Admitting: Family Medicine

## 2020-08-11 ENCOUNTER — Encounter: Payer: Self-pay | Admitting: Family Medicine

## 2020-08-11 ENCOUNTER — Ambulatory Visit: Payer: Medicare Other | Admitting: Family Medicine

## 2020-08-11 VITALS — BP 122/82 | HR 93 | Temp 97.0°F | Resp 20 | Ht 73.0 in | Wt 234.1 lb

## 2020-08-11 DIAGNOSIS — R2681 Unsteadiness on feet: Secondary | ICD-10-CM

## 2020-08-11 DIAGNOSIS — M25551 Pain in right hip: Secondary | ICD-10-CM

## 2020-08-11 DIAGNOSIS — M79604 Pain in right leg: Secondary | ICD-10-CM

## 2020-08-11 DIAGNOSIS — M549 Dorsalgia, unspecified: Secondary | ICD-10-CM

## 2020-08-11 DIAGNOSIS — M25552 Pain in left hip: Secondary | ICD-10-CM

## 2020-08-11 MED ORDER — METHYLPREDNISOLONE ACETATE 80 MG/ML IJ SUSP
60.0000 mg | Freq: Once | INTRAMUSCULAR | Status: AC
  Administered 2020-08-11: 60 mg via INTRAMUSCULAR

## 2020-08-11 MED ORDER — PREDNISONE 10 MG (21) PO TBPK: ORAL_TABLET | ORAL | 0 refills | Status: DC

## 2020-08-11 MED ORDER — KETOROLAC TROMETHAMINE 60 MG/2ML IM SOLN
30.0000 mg | Freq: Once | INTRAMUSCULAR | Status: AC
Start: 2020-08-11 — End: 2020-08-11
  Administered 2020-08-11: 30 mg via INTRAMUSCULAR

## 2020-08-11 NOTE — Patient Instructions (Signed)
I appreciate the opportunity to provide you with care for your health and wellness. Today we discussed: hip pain  Follow up: 09/13/2020 as scheduled  No labs or referrals today  Today you received 2 pain injections  I have sent in a prednisone dose pack to take for hips. Please take as directed on pack.  Hope you feel better :)  Please continue to practice social distancing to keep you, your family, and our community safe.  If you must go out, please wear a mask and practice good handwashing.  It was a pleasure to see you and I look forward to continuing to work together on your health and well-being. Please do not hesitate to call the office if you need care or have questions about your care.  Have a wonderful day and week. With Gratitude, Cherly Beach, DNP, AGNP-BC

## 2020-08-11 NOTE — Progress Notes (Signed)
Subjective:  Patient ID: SAMSON RALPH, male    DOB: Dec 18, 1951  Age: 69 y.o. MRN: [id number]  CC:  Chief Complaint  Patient presents with  . Acute Visit    right hip and leg pain has been going on for a few weeks he lifted something heavy at [place] and this has been going on since then which was few weeks ago has tried tylenol but this doesnt help pain is at a 7 right now but getting worser and worser       HPI  HPI  Mr. Silvio Clayman is a 69 year old male patient who presents today for an acute visit secondary to low back, right and left hip, right leg pain.  Is been going on for several weeks.  He is unsure exactly the specific how long its been going on.  He reports that he went to [facility] he lifted something heavy at [employer].  And ever since then he has been having trouble.  He tried taking Tylenol but it was not working.  His pain is a 7 out of 10 right now.  But it keeps getting worse and worse.  He reports that it is definitely worse with standing and trying to lay down.  He reports it is nagging and kind of drawling like sensation. He had a lumbar spine x-ray back in June.  They were some changes.  Questionable radiculopathy-like symptoms.  Today patient denies signs and symptoms of COVID 19 infection including fever, chills, cough, shortness of breath, and headache. Past Medical, Surgical, Social History, Allergies, and Medications have been Reviewed.   Past Medical History:  Diagnosis Date  . Acute blood loss anemia 01/05/2020  . Acute kidney injury ([place])   . Acute respiratory failure with hypoxia ([place])   . Arthritis   . COPD (chronic obstructive pulmonary disease) ([place])   . Depression   . Diabetes mellitus   . Diabetes mellitus without complication ([place])   . Elevated PSA 10/01/2016  . Encounter for support and coordination of transition of care 01/14/2020  . Hypercholesterolemia   . Hyperlipidemia   . Hypertension   . Insomnia 01/05/2016  . Multiple lung nodules on CT  04/02/2015  . Nicotine addiction   . Obesity   . Oxygen deficiency    qhs  . Schizophrenia ([place])     Current Meds  Medication Sig  . Accu-Chek Softclix Lancets lancets TEST BLOOD SUGAR ONCE DAILY AS DIRECTED.  Marland Kitchen acetaminophen (TYLENOL) 500 MG tablet Take 500-1,000 mg by mouth daily as needed for headache. Takes mostly for HA about 2 x/week.   Marland Kitchen albuterol (VENTOLIN HFA) 108 (90 Base) MCG/ACT inhaler INHALE 2 PUFFS INTO THE LUNGS EVERY 6 HOURS AS NEEDED FOR WHEEZING OR SHORTNESS OF BREATH.  Marland Kitchen Blood Glucose Monitoring Suppl (ACCU-CHEK AVIVA PLUS) w/Device KIT accucheck aviva meter Once daily testing DX e11.9  . budesonide-formoterol (SYMBICORT) 80-4.5 MCG/ACT inhaler INHALE 2 PUFFS INTO LUNGS TWICE DAILY.  . busPIRone (BUSPAR) 7.5 MG tablet TAKE 1 TABLET BY MOUTH 3 TIMES DAILY.  Marland Kitchen docusate sodium (COLACE) 100 MG capsule Take 100 mg by mouth daily as needed for mild constipation.   Marland Kitchen ezetimibe (ZETIA) 10 MG tablet Take 1 tablet (10 mg total) by mouth daily.  Marland Kitchen glipiZIDE (GLUCOTROL XL) 10 MG 24 hr tablet Take 1 tablet (10 mg total) by mouth daily with breakfast.  . glucose blood (ACCU-CHEK AVIVA PLUS) test strip USE TO TEST ONCE DAILY  . ipratropium-albuterol (DUONEB) 0.5-2.5 (3) MG/3ML SOLN USE  1 VIAL BY NEBULIZATION EVERY 4 TO 6 HOURS  . olmesartan (BENICAR) 40 MG tablet Take 1 tablet (40 mg total) by mouth daily.  . pantoprazole (PROTONIX) 40 MG tablet TAKE ONE TABLET BY MOUTHONCE DAILY.  . pravastatin (PRAVACHOL) 20 MG tablet TAKE (1) TABLET BY MOUTH AT BEDTIME FOR CHOLESTEROL. (Patient taking differently: Take 20 mg by mouth daily. TAKE (1) TABLET BY MOUTH AT BEDTIME FOR CHOLESTEROL.)  . risperiDONE (RISPERDAL) 1 MG tablet Take 1 tablet (1 mg total) by mouth See admin instructions. TAKE (1) TABLET BY MOUTH AT BEDTIME WITH 4MG.  . risperidone (RISPERDAL) 4 MG tablet TAKE ONE TABLET BY MOUTH IN THE EVENING. TAKE WITH 1MG TABLET.  Marland Kitchen traZODone (DESYREL) 50 MG tablet TAKE ONE TABLET BY MOUTH AT  BEDTIME AS NEEDED FOR SLEEP.  Marland Kitchen triamterene-hydrochlorothiazide (MAXZIDE-25) 37.5-25 MG tablet TAKE 1/2 TABLET BY MOUTH ONCE DAILY.  Marland Kitchen UNABLE TO FIND Nebulizer mouthpiece and tubing x 1  DX COPD  . UNABLE TO FIND XL pullups for daily use DX Incontinence  . UNABLE TO FIND Nebulizer tubing x 1  DX J44.9   Current Facility-Administered Medications for the 08/11/20 encounter (Office Visit) with Perlie Mayo, NP  Medication  . ketorolac (TORADOL) injection 30 mg  . methylPREDNISolone acetate (DEPO-MEDROL) injection 60 mg    ROS:  Review of Systems  Constitutional: Negative.   HENT: Negative.   Eyes: Negative.   Respiratory: Negative.   Cardiovascular: Negative.   Gastrointestinal: Negative.   Genitourinary: Negative.   Musculoskeletal: Positive for back pain and joint pain.  Skin: Negative.   Neurological: Negative.   Endo/Heme/Allergies: Negative.   Psychiatric/Behavioral: Negative.   All other systems reviewed and are negative.    Objective:   Today's Vitals: BP 122/82 (BP Location: Left Arm, Patient Position: Sitting, Cuff Size: Normal)   Pulse 93   Temp (!) 97 F (36.1 C) (Temporal)   Resp 20   Ht _0  (1.854 m)   Wt 234 lb 1.9 oz (106.2 kg)   SpO2 94%   BMI 30.89 kg/m  Vitals with BMI 08/11/2020 07/27/2020 06/23/2020  Height _1  _2  _3   Weight 234 lbs 2 oz 230 lbs 237 lbs 3 oz  BMI 30.9 14.78 29.5  Systolic 621 308 657  Diastolic 82 90 93  Pulse 93 - 89  Some encounter information is confidential and restricted. Go to Review Flowsheets activity to see all data.     Physical Exam Vitals and nursing note reviewed.  Constitutional:      Appearance: Normal appearance. He is obese.  HENT:     Head: Normocephalic and atraumatic.     Right Ear: External ear normal.     Left Ear: External ear normal.     Mouth/Throat:     Comments: Mask in place  Eyes:     General:        Right eye: No discharge.        Left eye: No discharge.     Conjunctiva/sclera:  Conjunctivae normal.  Cardiovascular:     Rate and Rhythm: Normal rate and regular rhythm.     Pulses: Normal pulses.     Heart sounds: Normal heart sounds.  Pulmonary:     Effort: Pulmonary effort is normal.     Breath sounds: Normal breath sounds.  Musculoskeletal:     Cervical back: Normal range of motion and neck supple.     Comments: MAE, Limited ROM in both R and L hip  Skin:    General: Skin is warm.  Neurological:     General: No focal deficit present.     Mental Status: He is alert and oriented to person, place, and time.  Psychiatric:        Mood and Affect: Mood normal.        Behavior: Behavior normal.        Thought Content: Thought content normal.        Judgment: Judgment normal.    Assessment   1. Hip pain, bilateral   2. Musculoskeletal leg pain, right   3. Unsteady gait   4. Back pain with radiation     Tests ordered No orders of the defined types were placed in this encounter.   Plan: Please see assessment and plan per problem list above.   Meds ordered this encounter  Medications  . methylPREDNISolone acetate (DEPO-MEDROL) injection 60 mg  . ketorolac (TORADOL) injection 30 mg  . predniSONE (STERAPRED UNI-PAK 21 TAB) 10 MG (21) TBPK tablet    Sig: Take as directed on package    Dispense:  21 tablet    Refill:  0    Order Specific Question:   Supervising Provider    Answer:   Fayrene Helper [[id number]]    Patient to follow-up in as scheduled  Perlie Mayo, NP

## 2020-08-11 NOTE — Assessment & Plan Note (Signed)
Reduced mobility, trouble with rising from chair, and standing. Uses cane, encouraged to to continue this. Pain injections today to help and pred pack.

## 2020-08-11 NOTE — Assessment & Plan Note (Signed)
Continues to have an unsteady gait. Uses cane. Pain in hips make movement harder, reports standing and laying down cause pain. Might need referral to Ortho if this does not help. And PT

## 2020-08-11 NOTE — Assessment & Plan Note (Signed)
Appears it might be radiculopathy from leg pain. Reduced mobility, trouble with rising from chair, and standing. Uses cane, encouraged to to continue this. Pain injections today to help and pred pack.

## 2020-08-11 NOTE — Assessment & Plan Note (Signed)
Chronic pain in back. Appears it might be causing radiculopathy. Reduced mobility, trouble with rising from chair, and standing. Uses cane, encouraged to to continue this. Pain injections today to help and pred pack.

## 2020-08-12 ENCOUNTER — Other Ambulatory Visit: Payer: Self-pay

## 2020-08-12 ENCOUNTER — Telehealth: Payer: Self-pay

## 2020-08-12 MED ORDER — TRAZODONE HCL 50 MG PO TABS: 50.0000 mg | ORAL_TABLET | Freq: Every evening | ORAL | 0 refills | Status: DC | PRN

## 2020-08-12 NOTE — Telephone Encounter (Signed)
Pt is calling states he is feeling better,   He still needs his Trazodone refilled.

## 2020-08-12 NOTE — Telephone Encounter (Signed)
Medication refilled

## 2020-08-20 ENCOUNTER — Other Ambulatory Visit: Payer: Self-pay | Admitting: Family Medicine

## 2020-08-25 ENCOUNTER — Other Ambulatory Visit: Payer: Self-pay

## 2020-08-25 ENCOUNTER — Telehealth: Payer: Self-pay | Admitting: Family Medicine

## 2020-08-25 MED ORDER — EZETIMIBE 10 MG PO TABS: 10.0000 mg | ORAL_TABLET | Freq: Every day | ORAL | 1 refills | Status: DC

## 2020-08-25 MED ORDER — EZETIMIBE 10 MG PO TABS: 10.0000 mg | ORAL_TABLET | Freq: Every day | ORAL | 0 refills | Status: DC

## 2020-08-25 MED ORDER — GLIPIZIDE ER 10 MG PO TB24: ORAL_TABLET | ORAL | 1 refills | Status: DC

## 2020-08-25 MED ORDER — GLIPIZIDE ER 10 MG PO TB24: ORAL_TABLET | ORAL | 0 refills | Status: DC

## 2020-08-25 NOTE — Telephone Encounter (Signed)
Patient called needing a refill on glipizide and ezetimibe sent to [place] patients phone is [phone]

## 2020-08-25 NOTE — Telephone Encounter (Signed)
Meds refilled.

## 2020-08-26 ENCOUNTER — Encounter: Payer: Self-pay | Admitting: Family Medicine

## 2020-08-26 ENCOUNTER — Telehealth (INDEPENDENT_AMBULATORY_CARE_PROVIDER_SITE_OTHER): Payer: Medicare Other | Admitting: Family Medicine

## 2020-08-26 ENCOUNTER — Other Ambulatory Visit: Payer: Self-pay

## 2020-08-26 VITALS — Ht 73.0 in | Wt 240.0 lb

## 2020-08-26 DIAGNOSIS — M25552 Pain in left hip: Secondary | ICD-10-CM

## 2020-08-26 DIAGNOSIS — M25551 Pain in right hip: Secondary | ICD-10-CM | POA: Diagnosis not present

## 2020-08-26 DIAGNOSIS — Z0100 Encounter for examination of eyes and vision without abnormal findings: Secondary | ICD-10-CM | POA: Diagnosis not present

## 2020-08-26 DIAGNOSIS — E119 Type 2 diabetes mellitus without complications: Secondary | ICD-10-CM | POA: Diagnosis not present

## 2020-08-26 MED ORDER — MELOXICAM 7.5 MG PO TABS: 7.5000 mg | ORAL_TABLET | Freq: Every day | ORAL | 0 refills | Status: DC

## 2020-08-26 NOTE — Patient Instructions (Addendum)
I appreciate the opportunity to provide you with care for your health and wellness. Today we discussed: pain  Follow up: 09/13/2020 as scheduled   No labs  Referrals today- ortho for hip and back and leg pain  New medication sent in to help until you can see the ortho doctor. I hope you feel better soon.  Please continue to practice social distancing to keep you, your family, and our community safe.  If you must go out, please wear a mask and practice good handwashing.  It was a pleasure to see you and I look forward to continuing to work together on your health and well-being. Please do not hesitate to call the office if you need care or have questions about your care.  Have a wonderful day and week. With Gratitude, Cherly Beach, DNP, AGNP-BC

## 2020-08-26 NOTE — Progress Notes (Signed)
Virtual Visit via Telephone Note   This visit type was conducted due to national recommendations for restrictions regarding the COVID-19 Pandemic (e.g. social distancing) in an effort to limit this patient's exposure and mitigate transmission in our community.  Due to his co-morbid illnesses, this patient is at least at moderate risk for complications without adequate follow up.  This format is felt to be most appropriate for this patient at this time.  The patient did not have access to video technology/had technical difficulties with video requiring transitioning to audio format only (telephone).  All issues noted in this document were discussed and addressed.  No physical exam could be performed with this format.    Evaluation Performed:  Follow-up visit  Date:  08/26/2020   ID:  Matthew Molina, DOB 10/12/1951, MRN [id number]  Patient Location: Home Provider Location: Office/Clinic  Location of Patient: Home Location of Provider: Telehealth Consent was obtain for visit to be over via telehealth. I verified that I am speaking with the correct person using two identifiers.  PCP:  Fayrene Helper, MD   Chief Complaint: Acute visit for ongoing hip back and leg pain  History of Present Illness:    Matthew Molina is a 69 y.o. male with history of having bilateral hip, back and leg pain.  Appears chronic in nature.  Previously just saw him for an acute visit back on August 25 at that time I treated him with prednisone, Toradol and prednisone injection in the office.  He reports that it helped a lot and now is come back and is even worse.  He declines wanting any physical therapy at this time.  And just would like to have some medicine to help with it.  He is open to me sending him to the orthopedist to see if there is anything that they could do.  His pain is a 10 out of 10 today while on the phone visit.  He reports he just keeps getting worse and worse.  He definitely has problems with  standing and trying to lay down.  He reports that it is a nagging and gnawing-like sensation.  Questionable radiculopathy-like symptoms.  Wants worked up from orthopedics we will look at whether or not gabapentin would be an option for him   The patient does not have symptoms concerning for COVID-19 infection (fever, chills, cough, or new shortness of breath).   Past Medical, Surgical, Social History, Allergies, and Medications have been Reviewed.  Past Medical History:  Diagnosis Date  . Acute blood loss anemia 01/05/2020  . Acute kidney injury ([place])   . Acute respiratory failure with hypoxia ([place])   . Arthritis   . COPD (chronic obstructive pulmonary disease) ([place])   . Depression   . Diabetes mellitus   . Diabetes mellitus without complication ([place])   . Elevated PSA 10/01/2016  . Encounter for support and coordination of transition of care 01/14/2020  . Hypercholesterolemia   . Hyperlipidemia   . Hypertension   . Insomnia 01/05/2016  . Multiple lung nodules on CT 04/02/2015  . Nicotine addiction   . Obesity   . Oxygen deficiency    qhs  . Schizophrenia [facility])    Past Surgical History:  Procedure Laterality Date  . CATARACT EXTRACTION W/PHACO Left 11/20/2013   Procedure: CATARACT EXTRACTION PHACO AND INTRAOCULAR LENS PLACEMENT (IOC);  Surgeon: Tonny Branch, MD;  Location: [facility] ORS;  Service: Ophthalmology;  Laterality: Left;  CDE:10.26  . CATARACT EXTRACTION W/PHACO Right  12/08/2013   Procedure: RIGHT EYE CATARACT EXTRACTION PHACO AND INTRAOCULAR LENS PLACEMENT ;  Surgeon: Tonny Branch, MD;  Location: [facility] OR[facility];  Service: Ophthalmology;  Laterality: Right;  CDE 12.38  . COLONOSCOPY N/A 04/01/2013   pancolonic diverticulosis, redundant colon, large internal hemorrhoids.   . COLONOSCOPY  2014   INCOMPLETE PREP IN R COLON  . COLONOSCOPY N/A 04/01/2020   Procedure: COLONOSCOPY;  Surgeon: Danie Binder, MD;  Location: [facility] ENDO SUITE;  Service: Endoscopy;  Laterality: N/A;  8:45am  .  ESOPHAGOGASTRODUODENOSCOPY N/A 02/18/2016   stricture at GE junction, moderate erosive gastritis and mild non-erosive duodenitis. +H.pylori. Treated initially with Prevpac. Breath test to check for eradication was positive, and he was prescribed Pylera.   Marland Kitchen EYE SURGERY Left 11/2013   cataract extraction  . POLYPECTOMY  04/01/2020   Procedure: POLYPECTOMY;  Surgeon: Danie Binder, MD;  Location: [facility];  Service: Endoscopy;;  . SAVORY DILATION N/A 02/18/2016   Procedure: SAVORY DILATION;  Surgeon: Danie Binder, MD;  Location: [facility];  Service: Endoscopy;  Laterality: N/A;  . SHOULDER SURGERY       Current Meds  Medication Sig  . Accu-Chek Softclix Lancets lancets TEST BLOOD SUGAR ONCE DAILY AS DIRECTED.  Marland Kitchen acetaminophen (TYLENOL) 500 MG tablet Take 500-1,000 mg by mouth daily as needed for headache. Takes mostly for HA about 2 x/week.   Marland Kitchen albuterol (VENTOLIN HFA) 108 (90 Base) MCG/ACT inhaler INHALE 2 PUFFS INTO THE LUNGS EVERY 6 HOURS AS NEEDED FOR WHEEZING OR SHORTNESS OF BREATH.  Marland Kitchen Blood Glucose Monitoring Suppl (ACCU-CHEK AVIVA PLUS) w/Device KIT accucheck aviva meter Once daily testing DX e11.9  . budesonide-formoterol (SYMBICORT) 80-4.5 MCG/ACT inhaler INHALE 2 PUFFS INTO LUNGS TWICE DAILY.  . busPIRone (BUSPAR) 7.5 MG tablet TAKE 1 TABLET BY MOUTH 3 TIMES DAILY.  Marland Kitchen docusate sodium (COLACE) 100 MG capsule Take 100 mg by mouth daily as needed for mild constipation.   Marland Kitchen ezetimibe (ZETIA) 10 MG tablet Take 1 tablet (10 mg total) by mouth daily.  Marland Kitchen glipiZIDE (GLUCOTROL XL) 10 MG 24 hr tablet TAKE 1 TABLEY BY MOUTH ONCE DAILY.  Marland Kitchen glucose blood (ACCU-CHEK AVIVA PLUS) test strip USE TO TEST ONCE DAILY  . ipratropium-albuterol (DUONEB) 0.5-2.5 (3) MG/3ML SOLN USE 1 VIAL BY NEBULIZATION EVERY 4 TO 6 HOURS  . olmesartan (BENICAR) 40 MG tablet Take 1 tablet (40 mg total) by mouth daily.  . pantoprazole (PROTONIX) 40 MG tablet TAKE ONE TABLET BY MOUTHONCE DAILY.  . pravastatin  (PRAVACHOL) 20 MG tablet TAKE (1) TABLET BY MOUTH AT BEDTIME FOR CHOLESTEROL. (Patient taking differently: Take 20 mg by mouth daily. TAKE (1) TABLET BY MOUTH AT BEDTIME FOR CHOLESTEROL.)  . predniSONE (STERAPRED UNI-PAK 21 TAB) 10 MG (21) TBPK tablet Take as directed on package  . risperiDONE (RISPERDAL) 1 MG tablet Take 1 tablet (1 mg total) by mouth See admin instructions. TAKE (1) TABLET BY MOUTH AT BEDTIME WITH $RemoveBefo'4MG'jFbfmBEQBvN$ .  . risperidone (RISPERDAL) 4 MG tablet TAKE ONE TABLET BY MOUTH IN THE EVENING. TAKE WITH $RemoveB'1MG'sNyJhTaK$  TABLET.  Marland Kitchen traZODone (DESYREL) 50 MG tablet Take 1 tablet (50 mg total) by mouth at bedtime as needed. for sleep  . triamterene-hydrochlorothiazide (MAXZIDE-25) 37.5-25 MG tablet TAKE 1/2 TABLET BY MOUTH ONCE DAILY.  Marland Kitchen UNABLE TO FIND Nebulizer mouthpiece and tubing x 1  DX COPD  . UNABLE TO FIND XL pullups for daily use DX Incontinence  . UNABLE TO FIND Nebulizer tubing x 1  DX J44.9  Allergies:   Sertraline hcl and Wellbutrin [bupropion]   ROS:   Please see the history of present illness.    All other systems reviewed and are negative.   Labs/Other Tests and Data Reviewed:    Recent Labs: 01/14/2020: Magnesium 2.1 05/26/2020: ALT 37; BUN 12; Creat 1.38; Hemoglobin 13.2; Platelets 267; Potassium 3.8; Sodium 135; TSH 1.31   Recent Lipid Panel Lab Results  Component Value Date/Time   CHOL 183 05/26/2020 11:49 AM   TRIG 341 (H) 05/26/2020 11:49 AM   HDL 34 (L) 05/26/2020 11:49 AM   CHOLHDL 5.4 (H) 05/26/2020 11:49 AM   LDLCALC 104 (H) 05/26/2020 11:49 AM    Wt Readings from Last 3 Encounters:  08/26/20 240 lb (108.9 kg)  08/11/20 234 lb 1.9 oz (106.2 kg)  07/27/20 230 lb (104.3 kg)     Objective:    Vital Signs:  Ht $R'6\' 1"'qo$  (1.854 m)   Wt 240 lb (108.9 kg)   BMI 31.66 kg/m    VITAL SIGNS:  reviewed GEN:  alert and oriented RESPIRATORY:  no shortness of breath in conversation PSYCH:  normal affect and mood   ASSESSMENT & PLAN:    1. Hip pain, bilateral  -  meloxicam (MOBIC) 7.5 MG tablet; Take 1 tablet (7.5 mg total) by mouth daily.  Dispense: 30 tablet; Refill: 0 - Ambulatory referral to Orthopedic Surgery   Time:   Today, I have spent 10 minutes with the patient with telehealth technology discussing the above problems.     Medication Adjustments/Labs and Tests Ordered: Current medicines are reviewed at length with the patient today.  Concerns regarding medicines are outlined above.   Tests Ordered: No orders of the defined types were placed in this encounter.   Medication Changes: No orders of the defined types were placed in this encounter.   Disposition:  Follow up as scheduled Signed, Perlie Mayo, NP  08/26/2020 2:01 PM     [facility]

## 2020-08-26 NOTE — Assessment & Plan Note (Signed)
Ongoing reduced mobility, trouble rising from chair and standing.  Uses cane.  Encouraged to continue to use this.  Referral to Ortho and 1 month supply of Mobic to help with inflammation.  Possible need for gabapentin or some other medication given if he has any radiculopathy-like discomfort that is ongoing and orthopedics feels that this is appropriate.

## 2020-08-30 ENCOUNTER — Telehealth: Payer: Self-pay | Admitting: Family Medicine

## 2020-08-31 ENCOUNTER — Other Ambulatory Visit: Payer: Self-pay

## 2020-08-31 MED ORDER — PRAVASTATIN SODIUM 20 MG PO TABS: 20.0000 mg | ORAL_TABLET | Freq: Every day | ORAL | 1 refills | Status: DC

## 2020-08-31 NOTE — Telephone Encounter (Signed)
Pt called and states pravastatin (PRAVACHOL) 20 MG tablet  Needs to be filled as well.

## 2020-08-31 NOTE — Telephone Encounter (Signed)
Med sent.

## 2020-09-01 DIAGNOSIS — J449 Chronic obstructive pulmonary disease, unspecified: Secondary | ICD-10-CM | POA: Diagnosis not present

## 2020-09-02 ENCOUNTER — Encounter: Payer: Self-pay | Admitting: Orthopaedic Surgery

## 2020-09-02 ENCOUNTER — Ambulatory Visit (INDEPENDENT_AMBULATORY_CARE_PROVIDER_SITE_OTHER): Payer: Medicare Other | Admitting: Orthopaedic Surgery

## 2020-09-02 ENCOUNTER — Other Ambulatory Visit: Payer: Self-pay

## 2020-09-02 VITALS — BP 148/100 | HR 96 | Ht 73.0 in | Wt 240.0 lb

## 2020-09-02 DIAGNOSIS — G8929 Other chronic pain: Secondary | ICD-10-CM | POA: Diagnosis not present

## 2020-09-02 DIAGNOSIS — M545 Low back pain, unspecified: Secondary | ICD-10-CM

## 2020-09-02 MED ORDER — HYDROCODONE-ACETAMINOPHEN 5-325 MG PO TABS: ORAL_TABLET | ORAL | 0 refills | Status: DC

## 2020-09-02 NOTE — Progress Notes (Signed)
Patient QA:STMHD Matthew Molina, male DOB:1951-10-04, 69 y.o. QQI:[id number]  Chief Complaint  Patient presents with  . Back Pain  . Hip Pain    HPI  Matthew Molina is a 69 y.o. male who has developed pain over the last three to four months in the lower back with pain radiation to the right foot.  He lifted a case of water about four months ago and the pain started.  He had X-rays of the lumbar spine done 05-28-2020 and showed grade 1 anterolisthesis at L4-5. I have reviewed his prior notes  I have independently reviewed and interpreted x-rays of this patient done at another site by another physician or qualified health professional.  He continues to have pain.  He has taken Mobic with only slight help.  He has pain now with bending and standing long periods of time.  I will get MRI.   Body mass index is 31.66 kg/m.  ROS  Review of Systems  Constitutional: Positive for activity change.  Respiratory: Positive for shortness of breath and wheezing.   Musculoskeletal: Positive for arthralgias and back pain.  All other systems reviewed and are negative.   All other systems reviewed and are negative.  The following is a summary of the past history medically, past history surgically, known current medicines, social history and family history.  This information is gathered electronically by the computer from prior information and documentation.  I review this each visit and have found including this information at this point in the chart is beneficial and informative.    Past Medical History:  Diagnosis Date  . Acute blood loss anemia 01/05/2020  . Acute kidney injury ([place])   . Acute respiratory failure with hypoxia ([place])   . Arthritis   . COPD (chronic obstructive pulmonary disease) ([place])   . Depression   . Diabetes mellitus   . Diabetes mellitus without complication ([place])   . Elevated PSA 10/01/2016  . Encounter for support and coordination of transition of care 01/14/2020  .  Hypercholesterolemia   . Hyperlipidemia   . Hypertension   . Insomnia 01/05/2016  . Multiple lung nodules on CT 04/02/2015  . Nicotine addiction   . Obesity   . Oxygen deficiency    qhs  . Schizophrenia [facility]     Past Surgical History:  Procedure Laterality Date  . CATARACT EXTRACTION W/PHACO Left 11/20/2013   Procedure: CATARACT EXTRACTION PHACO AND INTRAOCULAR LENS PLACEMENT (IOC);  Surgeon: Tonny Branch, MD;  Location: [facility] ORS;  Service: Ophthalmology;  Laterality: Left;  CDE:10.26  . CATARACT EXTRACTION W/PHACO Right 12/08/2013   Procedure: RIGHT EYE CATARACT EXTRACTION PHACO AND INTRAOCULAR LENS PLACEMENT ;  Surgeon: Tonny Branch, MD;  Location: [facility] OR[facility];  Service: Ophthalmology;  Laterality: Right;  CDE 12.38  . COLONOSCOPY N/A 04/01/2013   pancolonic diverticulosis, redundant colon, large internal hemorrhoids.   . COLONOSCOPY  2014   INCOMPLETE PREP IN R COLON  . COLONOSCOPY N/A 04/01/2020   Procedure: COLONOSCOPY;  Surgeon: Danie Binder, MD;  Location: [facility];  Service: Endoscopy;  Laterality: N/A;  8:45am  . ESOPHAGOGASTRODUODENOSCOPY N/A 02/18/2016   stricture at GE junction, moderate erosive gastritis and mild non-erosive duodenitis. +H.pylori. Treated initially with Prevpac. Breath test to check for eradication was positive, and he was prescribed Pylera.   Marland Kitchen EYE SURGERY Left 11/2013   cataract extraction  . POLYPECTOMY  04/01/2020   Procedure: POLYPECTOMY;  Surgeon: Danie Binder, MD;  Location: [facility];  Service: Endoscopy;;  .  SAVORY DILATION N/A 02/18/2016   Procedure: SAVORY DILATION;  Surgeon: Danie Binder, MD;  Location: [facility];  Service: Endoscopy;  Laterality: N/A;  . SHOULDER SURGERY      Family History  Problem Relation Age of Onset  . Diabetes Mother   . Hypertension Mother   . Stroke Mother   . Diabetes Sister   . Heart disease Sister   . Kidney disease Father   . Drug abuse Brother   . Colon cancer Neg Hx   . Colon polyps Neg Hx      Social History Social History   Tobacco Use  . Smoking status: Current Every Day Smoker    Packs/day: 1.00    Years: 48.00    Pack years: 48.00    Types: Cigarettes    Last attempt to quit: 05/02/2017    Years since quitting: 3.3  . Smokeless tobacco: Never Used  Vaping Use  . Vaping Use: Never used  Substance Use Topics  . Alcohol use: Not Currently    Alcohol/week: 0.0 standard drinks  . Drug use: No    Allergies  Allergen Reactions  . Sertraline Hcl     Stomach upset/pain  . Wellbutrin [Bupropion] Other (See Comments)    Makes stomach hurt    Current Outpatient Medications  Medication Sig Dispense Refill  . Accu-Chek Softclix Lancets lancets TEST BLOOD SUGAR ONCE DAILY AS DIRECTED. 100 each 5  . acetaminophen (TYLENOL) 500 MG tablet Take 500-1,000 mg by mouth daily as needed for headache. Takes mostly for HA about 2 x/week.     Marland Kitchen albuterol (VENTOLIN HFA) 108 (90 Base) MCG/ACT inhaler INHALE 2 PUFFS INTO THE LUNGS EVERY 6 HOURS AS NEEDED FOR WHEEZING OR SHORTNESS OF BREATH. 8.5 g 0  . Blood Glucose Monitoring Suppl (ACCU-CHEK AVIVA PLUS) w/Device KIT accucheck aviva meter Once daily testing DX e11.9 1 kit 0  . budesonide-formoterol (SYMBICORT) 80-4.5 MCG/ACT inhaler INHALE 2 PUFFS INTO LUNGS TWICE DAILY. 10.2 g 0  . busPIRone (BUSPAR) 7.5 MG tablet TAKE 1 TABLET BY MOUTH 3 TIMES DAILY. 270 tablet 0  . docusate sodium (COLACE) 100 MG capsule Take 100 mg by mouth daily as needed for mild constipation.     Marland Kitchen ezetimibe (ZETIA) 10 MG tablet Take 1 tablet (10 mg total) by mouth daily. 90 tablet 1  . glipiZIDE (GLUCOTROL XL) 10 MG 24 hr tablet TAKE 1 TABLEY BY MOUTH ONCE DAILY. 90 tablet 1  . glucose blood (ACCU-CHEK AVIVA PLUS) test strip USE TO TEST ONCE DAILY 100 each 5  . ipratropium-albuterol (DUONEB) 0.5-2.5 (3) MG/3ML SOLN USE 1 VIAL BY NEBULIZATION EVERY 4 TO 6 HOURS 1620 mL 1  . meloxicam (MOBIC) 7.5 MG tablet Take 1 tablet (7.5 mg total) by mouth daily. 30 tablet 0   . olmesartan (BENICAR) 40 MG tablet Take 1 tablet (40 mg total) by mouth daily. 90 tablet 0  . pantoprazole (PROTONIX) 40 MG tablet TAKE ONE TABLET BY MOUTHONCE DAILY. 30 tablet 11  . pravastatin (PRAVACHOL) 20 MG tablet Take 1 tablet (20 mg total) by mouth daily. TAKE (1) TABLET BY MOUTH AT BEDTIME FOR CHOLESTEROL. 90 tablet 1  . risperiDONE (RISPERDAL) 1 MG tablet TAKE (1) TABLET BY MOUTH AT BEDTIME WITH $RemoveBefo'4MG'ztJQOowtvly$ . 90 tablet 1  . risperidone (RISPERDAL) 4 MG tablet TAKE ONE TABLET BY MOUTH IN THE EVENING. TAKE WITH $RemoveB'1MG'CqhBJLTh$  TABLET. 90 tablet 0  . traZODone (DESYREL) 50 MG tablet Take 1 tablet (50 mg total) by mouth at bedtime as  needed. for sleep (Patient not taking: Reported on 09/02/2020) 90 tablet 0  . triamterene-hydrochlorothiazide (MAXZIDE-25) 37.5-25 MG tablet TAKE 1/2 TABLET BY MOUTH ONCE DAILY. 45 tablet 0  . UNABLE TO FIND Nebulizer mouthpiece and tubing x 1  DX COPD 1 each 0  . UNABLE TO FIND XL pullups for daily use DX Incontinence 100 each 11  . UNABLE TO FIND Nebulizer tubing x 1  DX J44.9 1 each 0   No current facility-administered medications for this visit.     Physical Exam  Blood pressure (!) 148/100, pulse 96, height $RemoveBe'6\' 1"'NZSkrkPbw$  (1.854 m), weight 240 lb (108.9 kg).  Constitutional: overall normal hygiene, normal nutrition, well developed, normal grooming, normal body habitus. Assistive device:cane  Musculoskeletal: gait and station Limp right, muscle tone and strength are normal, no tremors or atrophy is present.  .  Neurological: coordination overall normal.  Deep tendon reflex/nerve stretch intact.  Sensation normal.  Cranial nerves II-XII intact.   Skin:   Normal overall no scars, lesions, ulcers or rashes. No psoriasis.  Psychiatric: Alert and oriented x 3.  Recent memory intact, remote memory unclear.  Normal mood and affect. Well groomed.  Good eye contact.  Cardiovascular: overall no swelling, no varicosities, no edema bilaterally, normal temperatures of the legs and  arms, no clubbing, cyanosis and good capillary refill.  Lymphatic: palpation is normal.  Spine/Pelvis examination:  Inspection:  Overall, sacoiliac joint benign and hips nontender; without crepitus or defects.   Thoracic spine inspection: Alignment normal without kyphosis present   Lumbar spine inspection:  Alignment  with normal lumbar lordosis, without scoliosis apparent.   Thoracic spine palpation:  without tenderness of spinal processes   Lumbar spine palpation: without tenderness of lumbar area; without tightness of lumbar muscles    Range of Motion:   Lumbar flexion, forward flexion is normal without pain or tenderness    Lumbar extension is full without pain or tenderness   Left lateral bend is normal without pain or tenderness   Right lateral bend is normal without pain or tenderness   Straight leg raising is normal  Strength & tone: normal   Stability overall normal stability  All other systems reviewed and are negative   The patient has been educated about the nature of the problem(s) and counseled on treatment options.  The patient appeared to understand what I have discussed and is in agreement with it.  Encounter Diagnosis  Name Primary?  . Chronic midline low back pain without sciatica Yes    PLAN Call if any problems.  Precautions discussed.  Continue current medications.   Return to clinic 2 weeks   I have reviewed the [place] web site prior to prescribing narcotic medicine for this patient.   Get MRI.  I am concerned about HNP.  Electronically Signed Sanjuana Kava, MD 9/16/20219:59 AM

## 2020-09-06 ENCOUNTER — Other Ambulatory Visit: Payer: Self-pay | Admitting: Family Medicine

## 2020-09-06 DIAGNOSIS — J449 Chronic obstructive pulmonary disease, unspecified: Secondary | ICD-10-CM

## 2020-09-08 ENCOUNTER — Other Ambulatory Visit: Payer: Self-pay

## 2020-09-08 ENCOUNTER — Encounter: Payer: Self-pay | Admitting: Urology

## 2020-09-08 ENCOUNTER — Ambulatory Visit (INDEPENDENT_AMBULATORY_CARE_PROVIDER_SITE_OTHER): Payer: Medicare Other | Admitting: Urology

## 2020-09-08 VITALS — BP 157/78 | HR 99 | Temp 98.5°F | Ht 73.0 in | Wt 230.0 lb

## 2020-09-08 DIAGNOSIS — R339 Retention of urine, unspecified: Secondary | ICD-10-CM | POA: Diagnosis not present

## 2020-09-08 DIAGNOSIS — R972 Elevated prostate specific antigen [PSA]: Secondary | ICD-10-CM | POA: Diagnosis not present

## 2020-09-08 LAB — URINALYSIS, ROUTINE W REFLEX MICROSCOPIC
Bilirubin, UA: NEGATIVE
Glucose, UA: NEGATIVE
Ketones, UA: NEGATIVE
Leukocytes,UA: NEGATIVE
Nitrite, UA: NEGATIVE
Protein,UA: NEGATIVE
RBC, UA: NEGATIVE
Specific Gravity, UA: 1.015 (ref 1.005–1.030)
Urobilinogen, Ur: 0.2 mg/dL (ref 0.2–1.0)
pH, UA: 6.5 (ref 5.0–7.5)

## 2020-09-08 LAB — BLADDER SCAN AMB NON-IMAGING: Scan Result: 319

## 2020-09-08 MED ORDER — TAMSULOSIN HCL 0.4 MG PO CAPS: 0.4000 mg | ORAL_CAPSULE | Freq: Every day | ORAL | 11 refills | Status: DC

## 2020-09-08 NOTE — Progress Notes (Signed)
Urological Symptom Review  Patient is experiencing the following symptoms: Get up at night to urinate Stream starts and stops   Review of Systems  Gastrointestinal (upper)  : Nausea Indigestion/heartburn  Gastrointestinal (lower) : Constipation  Constitutional : Negative for symptoms  Skin: Negative for skin symptoms  Eyes: Blurred vision  Ear/Nose/Throat : Sinus problems  Hematologic/Lymphatic: Negative for Hematologic/Lymphatic symptoms  Cardiovascular : Chest pain  Respiratory : Cough Shortness of breath  Endocrine: Negative for endocrine symptoms  Musculoskeletal: Back pain  Neurological: Headaches Dizziness  Psychologic: Anxiety

## 2020-09-08 NOTE — Patient Instructions (Signed)

## 2020-09-08 NOTE — Progress Notes (Signed)
09/08/2020 11:39 AM   [name] 1951/09/28 [id number]  Referring provider: Fayrene Helper, MD [address], [address] [place],  [place] [address]  Elevated PSA  HPI: Mr Calzadilla is a 69yo here for evaluation of elevated OSA. PSA was 9.3 in 05/2020. 3 years ago is was 5.9. 4 years ago 4.95 NO significant LUTS. No family hx of prostate cancer. He has erectile dysfunction and has not been able to get an erection in over 3 years.   PMH: Past Medical History:  Diagnosis Date  . Acute blood loss anemia 01/05/2020  . Acute kidney injury ([place])   . Acute respiratory failure with hypoxia ([place])   . Arthritis   . COPD (chronic obstructive pulmonary disease) ([place])   . Depression   . Diabetes mellitus   . Diabetes mellitus without complication ([place])   . Diverticulitis   . Elevated PSA 10/01/2016  . Encounter for support and coordination of transition of care 01/14/2020  . Hypercholesterolemia   . Hyperlipidemia   . Hypertension   . Insomnia 01/05/2016  . Multiple lung nodules on CT 04/02/2015  . Nicotine addiction   . Obesity   . Oxygen deficiency    qhs  . Schizophrenia [facility])     Surgical History: Past Surgical History:  Procedure Laterality Date  . CATARACT EXTRACTION W/PHACO Left 11/20/2013   Procedure: CATARACT EXTRACTION PHACO AND INTRAOCULAR LENS PLACEMENT (IOC);  Surgeon: Tonny Branch, MD;  Location: [facility]S;  Service: Ophthalmology;  Laterality: Left;  CDE:10.26  . CATARACT EXTRACTION W/PHACO Right 12/08/2013   Procedure: RIGHT EYE CATARACT EXTRACTION PHACO AND INTRAOCULAR LENS PLACEMENT ;  Surgeon: Tonny Branch, MD;  Location: [facility] OR[facility];  Service: Ophthalmology;  Laterality: Right;  CDE 12.38  . COLONOSCOPY N/A 04/01/2013   pancolonic diverticulosis, redundant colon, large internal hemorrhoids.   . COLONOSCOPY  2014   INCOMPLETE PREP IN R COLON  . COLONOSCOPY N/A 04/01/2020   Procedure: COLONOSCOPY;  Surgeon: Danie Binder, MD;  Location: [facility];  Service: Endoscopy;   Laterality: N/A;  8:45am  . ESOPHAGOGASTRODUODENOSCOPY N/A 02/18/2016   stricture at GE junction, moderate erosive gastritis and mild non-erosive duodenitis. +H.pylori. Treated initially with Prevpac. Breath test to check for eradication was positive, and he was prescribed Pylera.   Marland Kitchen EYE SURGERY Left 11/2013   cataract extraction  . POLYPECTOMY  04/01/2020   Procedure: POLYPECTOMY;  Surgeon: Danie Binder, MD;  [facility];  Service: Endoscopy;;  . SAVORY DILATION N/A 02/18/2016   Procedure: SAVORY DILATION;  Surgeon: Danie Binder, MD;  Location: [facility];  Service: Endoscopy;  Laterality: N/A;  . SHOULDER SURGERY      Home Medications:  Allergies as of 09/08/2020      Reactions   Sertraline Hcl    Stomach upset/pain   Wellbutrin [bupropion] Other (See Comments)   Makes stomach hurt      Medication List       Accurate as of September 08, 2020 11:39 AM. If you have any questions, ask your nurse or doctor.        Accu-Chek Aviva Plus test strip Generic drug: glucose blood USE TO TEST ONCE DAILY   Accu-Chek Aviva Plus w/Device Kit accucheck aviva meter Once daily testing DX e11.9   Accu-Chek Softclix Lancets lancets TEST BLOOD SUGAR ONCE DAILY AS DIRECTED.   acetaminophen 500 MG tablet Commonly known as: TYLENOL Take 500-1,000 mg by mouth daily as needed for headache. Takes mostly for HA  about 2 x/week.   albuterol 108 (90 Base) MCG/ACT inhaler Commonly known as: VENTOLIN HFA INHALE 2 PUFFS INTO THE LUNGS EVERY 6 HOURS AS NEEDED FOR WHEEZING OR SHORTNESS OF BREATH.   budesonide-formoterol 80-4.5 MCG/ACT inhaler Commonly known as: Symbicort INHALE 2 PUFFS INTO LUNGS TWICE DAILY.   busPIRone 7.5 MG tablet Commonly known as: BUSPAR TAKE 1 TABLET BY MOUTH 3 TIMES DAILY.   Colace 100 MG capsule Generic drug: docusate sodium Take 100 mg by mouth daily as needed for mild constipation.   ezetimibe 10 MG tablet Commonly known as: ZETIA Take 1 tablet  (10 mg total) by mouth daily.   glipiZIDE 10 MG 24 hr tablet Commonly known as: GLUCOTROL XL TAKE 1 TABLEY BY MOUTH ONCE DAILY.   HYDROcodone-acetaminophen 5-325 MG tablet Commonly known as: NORCO/VICODIN One tablet every four hours as needed for acute pain.  Limit of five days per [place].   ipratropium-albuterol 0.5-2.5 (3) MG/3ML Soln Commonly known as: DUONEB USE 1 VIAL BY NEBULIZATION EVERY 4 TO 6 HOURS   meloxicam 7.5 MG tablet Commonly known as: Mobic Take 1 tablet (7.5 mg total) by mouth daily.   olmesartan 40 MG tablet Commonly known as: BENICAR Take 1 tablet (40 mg total) by mouth daily.   pantoprazole 40 MG tablet Commonly known as: PROTONIX TAKE ONE TABLET BY MOUTHONCE DAILY.   pravastatin 20 MG tablet Commonly known as: PRAVACHOL Take 1 tablet (20 mg total) by mouth daily. TAKE (1) TABLET BY MOUTH AT BEDTIME FOR CHOLESTEROL.   risperidone 4 MG tablet Commonly known as: RISPERDAL TAKE ONE TABLET BY MOUTH IN THE EVENING. TAKE WITH $RemoveB'1MG'dcAyXdzp$  TABLET.   risperiDONE 1 MG tablet Commonly known as: RISPERDAL TAKE (1) TABLET BY MOUTH AT BEDTIME WITH $RemoveBefo'4MG'sQqDRJGFYyG$ .   traZODone 50 MG tablet Commonly known as: DESYREL Take 1 tablet (50 mg total) by mouth at bedtime as needed. for sleep   triamterene-hydrochlorothiazide 37.5-25 MG tablet Commonly known as: MAXZIDE-25 TAKE 1/2 TABLET BY MOUTH ONCE DAILY.   UNABLE TO FIND Nebulizer mouthpiece and tubing x 1  DX COPD   UNABLE TO FIND XL pullups for daily use DX Incontinence   UNABLE TO FIND Nebulizer tubing x 1  DX J44.9       Allergies:  Allergies  Allergen Reactions  . Sertraline Hcl     Stomach upset/pain  . Wellbutrin [Bupropion] Other (See Comments)    Makes stomach hurt    Family History: Family History  Problem Relation Age of Onset  . Diabetes Mother   . Hypertension Mother   . Stroke Mother   . Diabetes Sister   . Heart disease Sister   . Kidney disease Father   . Drug abuse Brother   .  Colon cancer Neg Hx   . Colon polyps Neg Hx     Social History:  reports that he has been smoking cigarettes. He has a 48.00 pack-year smoking history. He has never used smokeless tobacco. He reports previous alcohol use. He reports that he does not use drugs.  ROS: All other review of systems were reviewed and are negative except what is noted above in HPI  Physical Exam: BP (!) 157/78   Pulse 99   Temp 98.5 F (36.9 C)   Ht $R'6\' 1"'dP$  (1.854 m)   Wt 230 lb (104.3 kg)   BMI 30.34 kg/m   Constitutional:  Alert and oriented, No acute distress. HEENT: [place] AT, moist mucus membranes.  Trachea midline, no masses. Cardiovascular: No clubbing,  cyanosis, or edema. Respiratory: Normal respiratory effort, no increased work of breathing. GI: Abdomen is soft, nontender, nondistended, no abdominal masses GU: No CVA tenderness. Circumcised phallus. No masses/lesions on penis, testis, scrotum. Prostate 80g smooth no nodules no induration.  Lymph: No cervical or inguinal lymphadenopathy. Skin: No rashes, bruises or suspicious lesions. Neurologic: Grossly intact, no focal deficits, moving all 4 extremities. Psychiatric: Normal mood and affect.  Laboratory Data: Lab Results  Component Value Date   WBC 7.3 05/26/2020   HGB 13.2 05/26/2020   HCT 40.3 05/26/2020   MCV 85.6 05/26/2020   PLT 267 05/26/2020    Lab Results  Component Value Date   CREATININE 1.38 (H) 05/26/2020    Lab Results  Component Value Date   PSA 9.3 (H) 05/26/2020   PSA 5.9 (H) 07/05/2017   PSA 4.96 (H) 06/05/2016    No results found for: TESTOSTERONE  Lab Results  Component Value Date   HGBA1C 7.8 (A) 05/27/2020    Urinalysis    Component Value Date/Time   COLORURINE lt. yellow 02/25/2009 1257   APPEARANCEUR Clear 02/25/2009 1257   LABSPEC 1.010 02/25/2009 1257   PHURINE 6.0 02/25/2009 1257   HGBUR negative 02/25/2009 1257   BILIRUBINUR negative 02/25/2009 1257   UROBILINOGEN 0.2 02/25/2009 1257   NITRITE  negative 02/25/2009 1257    No results found for: LABMICR, WBCUA, RBCUA, LABEPIT, MUCUS, BACTERIA  Pertinent Imaging:  No results found for this or any previous visit.  No results found for this or any previous visit.  No results found for this or any previous visit.  No results found for this or any previous visit.  Results for orders placed during the hospital encounter of 07/20/14  US Renal  Narrative CLINICAL DATA:  Evaluate for renal lesions or cysts.  EXAM: RENAL/URINARY TRACT ULTRASOUND COMPLETE  COMPARISON:  Chest CT 07/14/2014  FINDINGS: Right Kidney:  Length: 16.0 cm. There are multiple hypoechoic and anechoic structures in the right kidney that are suggestive for renal cysts. No evidence for hydronephrosis. Index cyst in the mid right kidney measures 3.5 x 4.4 x 3.8 cm. This appears to be a simple cyst with acoustic enhancement. Anechoic cyst in the upper pole measures 4.0 x 3.7 x 3.7 cm. The lower pole appears to be lobulated with small cysts.  Left Kidney:  Length: 13.4 cm. Question at least one echogenic focus in the left kidney mid pole which could represent a nonobstructive stone. No evidence for hydronephrosis. Question a small cyst in the midpole region.  Bladder:  Normal appearance of the urinary bladder. Bilateral ureter jets are present.  IMPRESSION: Multiple right renal cysts. Largest right renal cyst measures up to 4.4 cm. No suspicious renal lesions.  Cannot exclude a nonobstructive left kidney stone.   Electronically Signed By: Markus Daft M.D. On: 07/20/2014 14:05  No results found for this or any previous visit.  No results found for this or any previous visit.  No results found for this or any previous visit.   Assessment & Plan:    1. Elevated PSA The patient and I talked about etiologies of elevated PSA.  We discussed the possible relationship between elevated PSA and prostate cancer, BPH, prostatitis, infection  trauma and recent ejaculations.  The patient's PSA has been relatively stable over the interval and as such I recommended that we follow-up with a repeat PSA in 6 months.  If it remains elevated with a positive rising trend we will discuss prostate biopsy at his follow-up  appointment.  2. Incomplete emptying -We will start flomax 0.$RemoveBefore'4mg'msbohbqJdKAxV$  daily -RTC 4 weeks with PVR   No follow-ups on file.  Nicolette Bang, MD  [facility] [place]

## 2020-09-09 ENCOUNTER — Telehealth: Payer: Self-pay | Admitting: Family Medicine

## 2020-09-09 ENCOUNTER — Other Ambulatory Visit: Payer: Self-pay

## 2020-09-09 DIAGNOSIS — J441 Chronic obstructive pulmonary disease with (acute) exacerbation: Secondary | ICD-10-CM

## 2020-09-09 DIAGNOSIS — J42 Unspecified chronic bronchitis: Secondary | ICD-10-CM

## 2020-09-09 MED ORDER — UNABLE TO FIND: 0 refills | Status: DC

## 2020-09-09 NOTE — Telephone Encounter (Signed)
Patient called he states his nebulizer has stopped working and he is needing a new one with all the equipment sent to [facility] and if we could call and let him know when that has been sent over phone is [phone]

## 2020-09-09 NOTE — Telephone Encounter (Signed)
Order sent to  [facility] for neb machine

## 2020-09-13 ENCOUNTER — Ambulatory Visit (INDEPENDENT_AMBULATORY_CARE_PROVIDER_SITE_OTHER): Payer: Medicare Other | Admitting: Family Medicine

## 2020-09-13 ENCOUNTER — Encounter: Payer: Self-pay | Admitting: Family Medicine

## 2020-09-13 ENCOUNTER — Other Ambulatory Visit: Payer: Self-pay

## 2020-09-13 VITALS — BP 126/85 | HR 101 | Resp 16 | Ht 73.0 in | Wt 239.1 lb

## 2020-09-13 DIAGNOSIS — E669 Obesity, unspecified: Secondary | ICD-10-CM | POA: Diagnosis not present

## 2020-09-13 DIAGNOSIS — Z0001 Encounter for general adult medical examination with abnormal findings: Secondary | ICD-10-CM

## 2020-09-13 DIAGNOSIS — D126 Benign neoplasm of colon, unspecified: Secondary | ICD-10-CM

## 2020-09-13 DIAGNOSIS — E119 Type 2 diabetes mellitus without complications: Secondary | ICD-10-CM

## 2020-09-13 DIAGNOSIS — Z23 Encounter for immunization: Secondary | ICD-10-CM | POA: Diagnosis not present

## 2020-09-13 DIAGNOSIS — I10 Essential (primary) hypertension: Secondary | ICD-10-CM

## 2020-09-13 DIAGNOSIS — E1169 Type 2 diabetes mellitus with other specified complication: Secondary | ICD-10-CM

## 2020-09-13 DIAGNOSIS — N3941 Urge incontinence: Secondary | ICD-10-CM

## 2020-09-13 DIAGNOSIS — Z720 Tobacco use: Secondary | ICD-10-CM | POA: Diagnosis not present

## 2020-09-13 DIAGNOSIS — Z122 Encounter for screening for malignant neoplasm of respiratory organs: Secondary | ICD-10-CM

## 2020-09-13 DIAGNOSIS — I2510 Atherosclerotic heart disease of native coronary artery without angina pectoris: Secondary | ICD-10-CM

## 2020-09-13 DIAGNOSIS — E66811 Obesity, class 1: Secondary | ICD-10-CM

## 2020-09-13 DIAGNOSIS — R0789 Other chest pain: Secondary | ICD-10-CM

## 2020-09-13 DIAGNOSIS — Z0000 Encounter for general adult medical examination without abnormal findings: Secondary | ICD-10-CM

## 2020-09-13 DIAGNOSIS — J432 Centrilobular emphysema: Secondary | ICD-10-CM

## 2020-09-13 DIAGNOSIS — E785 Hyperlipidemia, unspecified: Secondary | ICD-10-CM

## 2020-09-13 LAB — POCT GLYCOSYLATED HEMOGLOBIN (HGB A1C): Hemoglobin A1C: 6.5 % — AB (ref 4.0–5.6)

## 2020-09-13 MED ORDER — ROSUVASTATIN CALCIUM 20 MG PO TABS
20.0000 mg | ORAL_TABLET | Freq: Every day | ORAL | 3 refills | Status: DC
Start: 2020-09-13 — End: 2021-06-25

## 2020-09-13 MED ORDER — BUSPIRONE HCL 7.5 MG PO TABS: ORAL_TABLET | ORAL | 1 refills | Status: DC

## 2020-09-13 MED ORDER — BUDESONIDE-FORMOTEROL FUMARATE 160-4.5 MCG/ACT IN AERO
2.0000 | INHALATION_SPRAY | Freq: Two times a day (BID) | RESPIRATORY_TRACT | 3 refills | Status: DC
Start: 2020-09-13 — End: 2021-09-15

## 2020-09-13 NOTE — Progress Notes (Signed)
Matthew Molina     MRN: [id number]      DOB: [birth year]   HPI: Patient is in for annual physical exam. Needs nebulizer machine , and tubing  Needs incontinence supplies, 28/month, uses mainly when out of the house, cannot get to restroom on time Tobacco cessation counseling is done Diabetes control is evaluated in office. Denies polyuria, polydipsia, blurred vision , or hypoglycemic episodes.  Recent labs, if available are reviewed. Immunization is reviewed , and  updated     PE; BP 126/85   Pulse (!) 101   Resp 16   Ht 6\' 1"  (1.854 m)   Wt 239 lb 1.9 oz (108.5 kg)   SpO2 96%   BMI 31.55 kg/m   Pleasant male, alert and oriented x 3, in no cardio-pulmonary distress. Afebrile. HEENT No facial trauma or asymetry. Sinuses non tender. EOMI External ears normal,  Neck: supple, no adenopathy,JVD or thyromegaly.No bruits.  Chest: Clear to ascultation bilaterally.No crackles or wheezes.Decreased though adequate air entry Non tender to palpation  Cardiovascular system; Heart sounds normal,  S1 and  S2 ,no S3.  No murmur, or thrill. Apical beat not displaced Peripheral pulses normal.  Abdomen: Soft, non tender, no organomegaly or masses. No bruits. Bowel sounds normal. No guarding, tenderness or rebound.    Musculoskeletal exam: Decreased though adequate  ROM of spine, hips , shoulders and knees.  deformity ,swelling and  crepitus noted. No muscle wasting or atrophy.   Neurologic: Cranial nerves 2 to 12 intact. Power, tone ,sensation and reflexes normal throughout. No disturbance in gait. No tremor.  Skin: Intact, no ulceration, erythema , scaling or rash noted. Pigmentation normal throughout  Psych; Normal mood and affect. Judgement and concentration normal   Assessment & Plan:  Annual physical exam Annual exam as documented. Counseling done  re healthy lifestyle involving commitment to 150 minutes exercise per week, heart healthy diet, and attaining  healthy weight.The importance of adequate sleep also discussed. Regular seat belt use and home safety, is also discussed. Changes in health habits are decided on by the patient with goals and time frames  set for achieving them. Immunization and cancer screening needs are specifically addressed at this visit.   Coronary atherosclerosis of native coronary artery Intermittent chest pain, multiple cV risk factors and cAD on scan,  Cardiology to re eval  Diabetes mellitus type 2 in obese [facility]) Mr. Torr is reminded of the importance of commitment to daily physical activity for 30 minutes or more, as able and the need to limit carbohydrate intake to 30 to 60 grams per meal to help with blood sugar control.   The need to take medication as prescribed, test blood sugar as directed, and to call between visits if there is a concern that blood sugar is uncontrolled is also discussed.   Mr. Mcclaren is reminded of the importance of daily foot exam, annual eye examination, and good blood sugar, blood pressure and cholesterol control.  Diabetic Labs Latest Ref Rng & Units 05/27/2020 05/26/2020 05/26/2020 03/10/2020 01/14/2020  HbA1c 4.0 - 5.6 % 7.8(A) - - - -  Microalbumin Not Estab. ug/mL - 36.7(H) 1.8 - -  Micro/Creat Ratio 0.0 - 30.0 mg/g creat - - - - -  Chol <200 mg/dL - - 183 - -  HDL > OR = 40 mg/dL - - 34(L) - -  Calc LDL mg/dL (calc) - - 104(H) - -  Triglycerides <150 mg/dL - - 341(H) - -  Creatinine 0.70 - 1.18  mg/dL - - 1.38(H) 1.91(H) 1.21(H)   BP/Weight 09/13/2020 09/08/2020 09/02/2020 08/26/2020 08/11/2020 9/37/3428 06/22/8114  Systolic BP 726 203 559 - 741 638 453  Diastolic BP 85 78 646 - 82 90 93  Wt. (Lbs) 239.12 230 240 240 234.12 230 237.2  BMI 31.55 30.34 31.66 31.66 30.89 30.34 31.29  Some encounter information is confidential and restricted. Go to Review Flowsheets activity to see all data.   Foot/eye exam completion dates Latest Ref Rng & Units 10/08/2019 10/15/2018  Eye Exam No  Retinopathy - No Retinopathy  Foot Form Completion - Done -      Updated lab needed   Hyperlipidemia Not at goal, change crestor and fasting lipid in 4 weeks  Urinary incontinence Reports incontinence of urine when he is away from home, difficulty holding urine as public bathroom access is limited.  Needs incontinence supplies for use outside of home, limited by mobility as well as has urge incontinence, script for needed supplies provided  Emphysema lung (HCC) Radiologically established emphysema wit ongoing nicotine use. Needs neb treatments daily for assistance with breathing and  Good level of function inside of and outside of  the home.Nebulizer machine and  tubing  ordered  Tobacco abuse Asked:confirms currently smokes cigarettes Assess: Unwilling to set a quit date, but is cutting back Advise: needs to QUIT to reduce risk of cancer, cardio and cerebrovascular disease Assist: counseled for 5 minutes and literature provided Arrange: follow up in 2 to 4 months   Obesity (BMI 30.0-34.9)  Patient re-educated about  the importance of commitment to a  minimum of 150 minutes of exercise per week as able.  The importance of healthy food choices with portion control discussed, as well as eating regularly and within a 12 hour window most days. The need to choose "clean , green" food 50 to 75% of the time is discussed, as well as to make water the primary drink and set a goal of 64 ounces water daily.    Weight /BMI 09/16/2020 09/13/2020 09/08/2020  WEIGHT 239 lb 239 lb 1.9 oz 230 lb  HEIGHT 6\' 1"  6\' 1"  6\' 1"   BMI 31.53 kg/m2 31.55 kg/m2 30.34 kg/m2  Some encounter information is confidential and restricted. Go to Review Flowsheets activity to see all data.

## 2020-09-13 NOTE — Assessment & Plan Note (Signed)

## 2020-09-13 NOTE — Patient Instructions (Addendum)
F/U in office with MD in mid January call if you need me sooner.  Flu vaccine in office today.  GlycohB in office today  Fasting liipid, cmp and eGFR  , hBA1C 1 week before next visit Lung scan for screening for lung cancer to be scheduled for November when it is due   Currently smoking 2 packs of cigarettes daily please reduce to 1 pack a day starting today as we discussed.  This will reduce your risk of heart disease as well as all types of cancer.  Change in cholesterol medicine from pravastatin to Crestor starting today the new prescription has been sent in.  Also work on reducing fried and fatty foods.  You are referred to Cardiology for re evaluation Reduce dose on buspirone as discussed to 1 tablet once daily every morning this is sent in.  Glyc  Incontinence supplies at 28 pull-ups per month to be dispensed as we discussed.  Prescription for  nebulizing machine and tubing are  prescribed.  Dose increase in Symbicort as you are using your rescue inhaler at least twice per day.

## 2020-09-13 NOTE — Assessment & Plan Note (Signed)
Not at goal, change crestor and fasting lipid in 4 weeks

## 2020-09-13 NOTE — Assessment & Plan Note (Signed)
Matthew Molina is reminded of the importance of commitment to daily physical activity for 30 minutes or more, as able and the need to limit carbohydrate intake to 30 to 60 grams per meal to help with blood sugar control.   The need to take medication as prescribed, test blood sugar as directed, and to call between visits if there is a concern that blood sugar is uncontrolled is also discussed.   Matthew Molina is reminded of the importance of daily foot exam, annual eye examination, and good blood sugar, blood pressure and cholesterol control.  Diabetic Labs Latest Ref Rng & Units 05/27/2020 05/26/2020 05/26/2020 03/10/2020 01/14/2020  HbA1c 4.0 - 5.6 % 7.8(A) - - - -  Microalbumin Not Estab. ug/mL - 36.7(H) 1.8 - -  Micro/Creat Ratio 0.0 - 30.0 mg/g creat - - - - -  Chol <200 mg/dL - - 183 - -  HDL > OR = 40 mg/dL - - 34(L) - -  Calc LDL mg/dL (calc) - - 104(H) - -  Triglycerides <150 mg/dL - - 341(H) - -  Creatinine 0.70 - 1.18 mg/dL - - 1.38(H) 1.91(H) 1.21(H)   BP/Weight 09/13/2020 09/08/2020 09/02/2020 08/26/2020 08/11/2020 4/66/5993 04/23/176  Systolic BP 939 030 092 - 330 076 226  Diastolic BP 85 78 333 - 82 90 93  Wt. (Lbs) 239.12 230 240 240 234.12 230 237.2  BMI 31.55 30.34 31.66 31.66 30.89 30.34 31.29  Some encounter information is confidential and restricted. Go to Review Flowsheets activity to see all data.   Foot/eye exam completion dates Latest Ref Rng & Units 10/08/2019 10/15/2018  Eye Exam No Retinopathy - No Retinopathy  Foot Form Completion - Done -      Updated lab needed

## 2020-09-13 NOTE — Assessment & Plan Note (Signed)
Intermittent chest pain, multiple cV risk factors and cAD on scan,  Cardiology to re eval

## 2020-09-14 ENCOUNTER — Ambulatory Visit (HOSPITAL_COMMUNITY)
Admission: RE | Admit: 2020-09-14 | Discharge: 2020-09-14 | Disposition: A | Discharge status: Home / Self Care | Payer: Medicare Other | Source: Ambulatory Visit | Attending: Orthopaedic Surgery | Admitting: Orthopaedic Surgery

## 2020-09-14 DIAGNOSIS — M4807 Spinal stenosis, lumbosacral region: Secondary | ICD-10-CM | POA: Diagnosis not present

## 2020-09-14 DIAGNOSIS — M545 Low back pain, unspecified: Secondary | ICD-10-CM

## 2020-09-14 DIAGNOSIS — G8929 Other chronic pain: Secondary | ICD-10-CM | POA: Diagnosis not present

## 2020-09-14 DIAGNOSIS — M5117 Intervertebral disc disorders with radiculopathy, lumbosacral region: Secondary | ICD-10-CM | POA: Diagnosis not present

## 2020-09-14 DIAGNOSIS — M48061 Spinal stenosis, lumbar region without neurogenic claudication: Secondary | ICD-10-CM | POA: Diagnosis not present

## 2020-09-14 DIAGNOSIS — M5116 Intervertebral disc disorders with radiculopathy, lumbar region: Secondary | ICD-10-CM | POA: Diagnosis not present

## 2020-09-16 ENCOUNTER — Other Ambulatory Visit: Payer: Self-pay

## 2020-09-16 ENCOUNTER — Ambulatory Visit (INDEPENDENT_AMBULATORY_CARE_PROVIDER_SITE_OTHER): Payer: Medicare Other | Admitting: Orthopaedic Surgery

## 2020-09-16 ENCOUNTER — Telehealth: Payer: Self-pay | Admitting: Orthopaedic Surgery

## 2020-09-16 ENCOUNTER — Encounter: Payer: Self-pay | Admitting: Orthopaedic Surgery

## 2020-09-16 VITALS — Ht 73.0 in | Wt 239.0 lb

## 2020-09-16 DIAGNOSIS — M545 Low back pain, unspecified: Secondary | ICD-10-CM

## 2020-09-16 DIAGNOSIS — G8929 Other chronic pain: Secondary | ICD-10-CM | POA: Diagnosis not present

## 2020-09-16 NOTE — Telephone Encounter (Signed)
Spoke with pt and answered all concerns.

## 2020-09-16 NOTE — Telephone Encounter (Signed)
Patient called (voice message was left at 1:37pm today - patient relays that he would like to speak with Dr Luna Glasgow or nurse regarding what was discussed at his office visit today, as surgery was mentioned. Please call back at [phone]

## 2020-09-16 NOTE — Progress Notes (Signed)
Patient Matthew Molina, male DOB:[birth year], 69 y.o. AVW:[id number]  No chief complaint on file.   HPI  Matthew Molina is a 69 y.o. male who has lower back pain.  He had the MRI which showed: IMPRESSION: Moderate subarticular and foraminal stenosis bilaterally L3-4  5 mm anterolisthesis L5-S1 with moderate to severe spinal stenosis and moderate to severe subarticular and foraminal stenosis bilaterally  Moderate subarticular stenosis bilaterally L5-S1 due to spurring.  I have explained the findings to him.  I gave him a copy of the report.  He is to continue his medicines.  He may need to see neurosurgeon if not improved.   Body mass index is 31.53 kg/m.  ROS  Review of Systems  Constitutional: Positive for activity change.  Respiratory: Positive for shortness of breath and wheezing.   Musculoskeletal: Positive for arthralgias and back pain.  All other systems reviewed and are negative.   All other systems reviewed and are negative.  The following is a summary of the past history medically, past history surgically, known current medicines, social history and family history.  This information is gathered electronically by the computer from prior information and documentation.  I review this each visit and have found including this information at this point in the chart is beneficial and informative.    Past Medical History:  Diagnosis Date  . Acute blood loss anemia 01/05/2020  . Acute kidney injury ([place])   . Acute respiratory failure with hypoxia ([place])   . Arthritis   . COPD (chronic obstructive pulmonary disease) ([place])   . Depression   . Diabetes mellitus   . Diabetes mellitus without complication ([place])   . Diverticulitis   . Elevated PSA 10/01/2016  . Encounter for support and coordination of transition of care 01/14/2020  . Hypercholesterolemia   . Hyperlipidemia   . Hypertension   . Insomnia 01/05/2016  . Ischemic colitis ([place]) 01/05/2020  . Multiple lung  nodules on CT 04/02/2015  . Nicotine addiction   . Obesity   . Oxygen deficiency    qhs  . Schizophrenia [facility])     Past Surgical History:  Procedure Laterality Date  . CATARACT EXTRACTION W/PHACO Left 11/20/2013   Procedure: CATARACT EXTRACTION PHACO AND INTRAOCULAR LENS PLACEMENT (IOC);  Surgeon: Tonny Branch, MD;  Location: [facility] ORS;  Service: Ophthalmology;  Laterality: Left;  CDE:10.26  . CATARACT EXTRACTION W/PHACO Right 12/08/2013   Procedure: RIGHT EYE CATARACT EXTRACTION PHACO AND INTRAOCULAR LENS PLACEMENT ;  Surgeon: Tonny Branch, MD;  Location: [facility] ORS;  Service: Ophthalmology;  Laterality: Right;  CDE 12.38  . COLONOSCOPY N/A 04/01/2013   pancolonic diverticulosis, redundant colon, large internal hemorrhoids.   . COLONOSCOPY  2014   INCOMPLETE PREP IN R COLON  . COLONOSCOPY N/A 04/01/2020   Procedure: COLONOSCOPY;  Surgeon: Danie Binder, MD;  Location: [facility];  Service: Endoscopy;  Laterality: N/A;  8:45am  . ESOPHAGOGASTRODUODENOSCOPY N/A 02/18/2016   stricture at GE junction, moderate erosive gastritis and mild non-erosive duodenitis. +H.pylori. Treated initially with Prevpac. Breath test to check for eradication was positive, and he was prescribed Pylera.   Marland Kitchen EYE SURGERY Left 11/2013   cataract extraction  . POLYPECTOMY  04/01/2020   Procedure: POLYPECTOMY;  Surgeon: Danie Binder, MD;  Location: [facility];  Service: Endoscopy;;  . SAVORY DILATION N/A 02/18/2016   Procedure: SAVORY DILATION;  Surgeon: Danie Binder, MD;  Location: [facility];  Service: Endoscopy;  Laterality: N/A;  . SHOULDER SURGERY  Family History  Problem Relation Age of Onset  . Diabetes Mother   . Hypertension Mother   . Stroke Mother   . Diabetes Sister   . Heart disease Sister   . Kidney disease Father   . Drug abuse Brother   . Colon cancer Neg Hx   . Colon polyps Neg Hx     Social History Social History   Tobacco Use  . Smoking status: Current Every Day Smoker     Packs/day: 1.00    Years: 48.00    Pack years: 48.00    Types: Cigarettes    Last attempt to quit: 05/02/2017    Years since quitting: 3.3  . Smokeless tobacco: Never Used  Vaping Use  . Vaping Use: Never used  Substance Use Topics  . Alcohol use: Not Currently    Alcohol/week: 0.0 standard drinks  . Drug use: No    Allergies  Allergen Reactions  . Sertraline Hcl     Stomach upset/pain  . Wellbutrin [Bupropion] Other (See Comments)    Makes stomach hurt    Current Outpatient Medications  Medication Sig Dispense Refill  . Accu-Chek Softclix Lancets lancets TEST BLOOD SUGAR ONCE DAILY AS DIRECTED. 100 each 5  . acetaminophen (TYLENOL) 500 MG tablet Take 500-1,000 mg by mouth daily as needed for headache. Takes mostly for HA about 2 x/week.     Marland Kitchen albuterol (VENTOLIN HFA) 108 (90 Base) MCG/ACT inhaler INHALE 2 PUFFS INTO THE LUNGS EVERY 6 HOURS AS NEEDED FOR WHEEZING OR SHORTNESS OF BREATH. 8.5 g 0  . Blood Glucose Monitoring Suppl (ACCU-CHEK AVIVA PLUS) w/Device KIT accucheck aviva meter Once daily testing DX e11.9 1 kit 0  . budesonide-formoterol (SYMBICORT) 160-4.5 MCG/ACT inhaler Inhale 2 puffs into the lungs 2 (two) times daily. 1 each 3  . busPIRone (BUSPAR) 7.5 MG tablet Take one tablet by mouth once daily, for anxiety 90 tablet 1  . docusate sodium (COLACE) 100 MG capsule Take 100 mg by mouth daily as needed for mild constipation.     Marland Kitchen ezetimibe (ZETIA) 10 MG tablet Take 1 tablet (10 mg total) by mouth daily. 90 tablet 1  . glipiZIDE (GLUCOTROL XL) 10 MG 24 hr tablet TAKE 1 TABLEY BY MOUTH ONCE DAILY. 90 tablet 1  . glucose blood (ACCU-CHEK AVIVA PLUS) test strip USE TO TEST ONCE DAILY 100 each 5  . meloxicam (MOBIC) 7.5 MG tablet Take 1 tablet (7.5 mg total) by mouth daily. 30 tablet 0  . olmesartan (BENICAR) 40 MG tablet Take 1 tablet (40 mg total) by mouth daily. 90 tablet 0  . pantoprazole (PROTONIX) 40 MG tablet TAKE ONE TABLET BY MOUTHONCE DAILY. 30 tablet 11  .  risperiDONE (RISPERDAL) 1 MG tablet TAKE (1) TABLET BY MOUTH AT BEDTIME WITH $RemoveBefo'4MG'ScNZlkVAbja$ . 90 tablet 1  . risperidone (RISPERDAL) 4 MG tablet TAKE ONE TABLET BY MOUTH IN THE EVENING. TAKE WITH $RemoveB'1MG'uQcIfKmC$  TABLET. 90 tablet 0  . rosuvastatin (CRESTOR) 20 MG tablet Take 1 tablet (20 mg total) by mouth daily. 90 tablet 3  . tamsulosin (FLOMAX) 0.4 MG CAPS capsule Take 1 capsule (0.4 mg total) by mouth daily after supper. 30 capsule 11  . traZODone (DESYREL) 50 MG tablet Take 1 tablet (50 mg total) by mouth at bedtime as needed. for sleep 90 tablet 0  . triamterene-hydrochlorothiazide (MAXZIDE-25) 37.5-25 MG tablet TAKE 1/2 TABLET BY MOUTH ONCE DAILY. 45 tablet 0  . UNABLE TO FIND Nebulizer mouthpiece and tubing x 1  DX COPD 1 each  0  . UNABLE TO FIND XL pullups for daily use DX Incontinence 100 each 11  . UNABLE TO FIND Nebulizer machine Mask and tubing x 1  DX J44.9 1 each 0   No current facility-administered medications for this visit.     Physical Exam  Height $Remov'6\' 1"'wYrDCQ$  (1.854 m), weight 239 lb (108.4 kg).  Constitutional: overall normal hygiene, normal nutrition, well developed, normal grooming, normal body habitus. Assistive device:none  Musculoskeletal: gait and station Limp none, muscle tone and strength are normal, no tremors or atrophy is present.  .  Neurological: coordination overall normal.  Deep tendon reflex/nerve stretch intact.  Sensation normal.  Cranial nerves II-XII intact.   Skin:   Normal overall no scars, lesions, ulcers or rashes. No psoriasis.  Psychiatric: Alert and oriented x 3.  Recent memory intact, remote memory unclear.  Normal mood and affect. Well groomed.  Good eye contact.  Cardiovascular: overall no swelling, no varicosities, no edema bilaterally, normal temperatures of the legs and arms, no clubbing, cyanosis and good capillary refill.  Lymphatic: palpation is normal.  Spine/Pelvis examination:  Inspection:  Overall, sacoiliac joint benign and hips nontender; without  crepitus or defects.   Thoracic spine inspection: Alignment normal without kyphosis present   Lumbar spine inspection:  Alignment  with normal lumbar lordosis, without scoliosis apparent.   Thoracic spine palpation:  without tenderness of spinal processes   Lumbar spine palpation: without tenderness of lumbar area; without tightness of lumbar muscles    Range of Motion:   Lumbar flexion, forward flexion is normal without pain or tenderness    Lumbar extension is full without pain or tenderness   Left lateral bend is normal without pain or tenderness   Right lateral bend is normal without pain or tenderness   Straight leg raising is normal  Strength & tone: normal   Stability overall normal stability  All other systems reviewed and are negative   The patient has been educated about the nature of the problem(s) and counseled on treatment options.  The patient appeared to understand what I have discussed and is in agreement with it.  Encounter Diagnosis  Name Primary?  . Chronic midline low back pain without sciatica Yes    PLAN Call if any problems.  Precautions discussed.  Continue current medications.   Return to clinic 1 month   Electronically Signed Sanjuana Kava, MD 9/30/202110:26 AM

## 2020-09-16 NOTE — Telephone Encounter (Signed)
Patient states he was seen here this morning and thought you were going to send in pain medication for him.  He states that this was not done.  He is requesting Hydrocodone/Acetaminophen 5-325 mgs.  Qty 30 be called in to [place]

## 2020-09-19 ENCOUNTER — Encounter: Payer: Self-pay | Admitting: Family Medicine

## 2020-09-19 DIAGNOSIS — J439 Emphysema, unspecified: Secondary | ICD-10-CM | POA: Insufficient documentation

## 2020-09-19 MED ORDER — HYDROCODONE-ACETAMINOPHEN 5-325 MG PO TABS: ORAL_TABLET | ORAL | 0 refills | Status: DC

## 2020-09-19 NOTE — Assessment & Plan Note (Signed)
  Patient re-educated about  the importance of commitment to a  minimum of 150 minutes of exercise per week as able.  The importance of healthy food choices with portion control discussed, as well as eating regularly and within a 12 hour window most days. The need to choose "clean , green" food 50 to 75% of the time is discussed, as well as to make water the primary drink and set a goal of 64 ounces water daily.    Weight /BMI 09/16/2020 09/13/2020 09/08/2020  WEIGHT 239 lb 239 lb 1.9 oz 230 lb  HEIGHT 6\' 1"  6\' 1"  6\' 1"   BMI 31.53 kg/m2 31.55 kg/m2 30.34 kg/m2  Some encounter information is confidential and restricted. Go to Review Flowsheets activity to see all data.

## 2020-09-19 NOTE — Assessment & Plan Note (Signed)
Radiologically established emphysema wit ongoing nicotine use. Needs neb treatments daily for assistance with breathing and  Good level of function inside of and outside of  the home.Nebulizer machine and  tubing  ordered

## 2020-09-19 NOTE — Assessment & Plan Note (Signed)
Asked:confirms currently smokes cigarettes °Assess: Unwilling to set a quit date, but is cutting back °Advise: needs to QUIT to reduce risk of cancer, cardio and cerebrovascular disease °Assist: counseled for 5 minutes and literature provided °Arrange: follow up in 2 to 4 months ° °

## 2020-09-19 NOTE — Assessment & Plan Note (Signed)
Reports incontinence of urine when he is away from home, difficulty holding urine as public bathroom access is limited.  Needs incontinence supplies for use outside of home, limited by mobility as well as has urge incontinence, script for needed supplies provided

## 2020-09-21 ENCOUNTER — Other Ambulatory Visit: Payer: Self-pay

## 2020-09-21 DIAGNOSIS — J42 Unspecified chronic bronchitis: Secondary | ICD-10-CM

## 2020-09-21 DIAGNOSIS — J441 Chronic obstructive pulmonary disease with (acute) exacerbation: Secondary | ICD-10-CM

## 2020-09-21 DIAGNOSIS — R32 Unspecified urinary incontinence: Secondary | ICD-10-CM

## 2020-09-21 MED ORDER — UNABLE TO FIND: 11 refills | Status: DC

## 2020-09-21 MED ORDER — UNABLE TO FIND: 0 refills | Status: DC

## 2020-09-24 ENCOUNTER — Encounter (HOSPITAL_COMMUNITY): Payer: Self-pay

## 2020-09-27 ENCOUNTER — Other Ambulatory Visit: Payer: Self-pay

## 2020-09-27 ENCOUNTER — Other Ambulatory Visit: Payer: Self-pay | Admitting: Family Medicine

## 2020-09-27 DIAGNOSIS — M25551 Pain in right hip: Secondary | ICD-10-CM

## 2020-09-27 DIAGNOSIS — J42 Unspecified chronic bronchitis: Secondary | ICD-10-CM

## 2020-09-27 DIAGNOSIS — J441 Chronic obstructive pulmonary disease with (acute) exacerbation: Secondary | ICD-10-CM

## 2020-09-27 MED ORDER — UNABLE TO FIND: 0 refills | Status: DC

## 2020-09-28 ENCOUNTER — Other Ambulatory Visit: Payer: Self-pay | Admitting: *Deleted

## 2020-09-28 MED ORDER — OLMESARTAN MEDOXOMIL 40 MG PO TABS
40.0000 mg | ORAL_TABLET | Freq: Every day | ORAL | 0 refills | Status: DC
Start: 2020-09-28 — End: 2020-12-30

## 2020-09-30 ENCOUNTER — Other Ambulatory Visit: Payer: Self-pay | Admitting: *Deleted

## 2020-09-30 DIAGNOSIS — M25551 Pain in right hip: Secondary | ICD-10-CM

## 2020-09-30 MED ORDER — MELOXICAM 7.5 MG PO TABS: 7.5000 mg | ORAL_TABLET | Freq: Every day | ORAL | 0 refills | Status: DC

## 2020-10-01 DIAGNOSIS — J449 Chronic obstructive pulmonary disease, unspecified: Secondary | ICD-10-CM | POA: Diagnosis not present

## 2020-10-06 ENCOUNTER — Other Ambulatory Visit: Payer: Self-pay | Admitting: Family Medicine

## 2020-10-06 ENCOUNTER — Telehealth: Payer: Self-pay

## 2020-10-06 DIAGNOSIS — J449 Chronic obstructive pulmonary disease, unspecified: Secondary | ICD-10-CM

## 2020-10-06 DIAGNOSIS — J42 Unspecified chronic bronchitis: Secondary | ICD-10-CM | POA: Diagnosis not present

## 2020-10-06 DIAGNOSIS — J441 Chronic obstructive pulmonary disease with (acute) exacerbation: Secondary | ICD-10-CM | POA: Diagnosis not present

## 2020-10-06 NOTE — Telephone Encounter (Signed)
Patient lvm stating he needed a new nebulizer machine as his had stopped working. Prescription for new machine with supplies was sent in on 09/27/2020. Called [facility] to check on progress. [facility] stated they had tried getting in touch with patient and left messages but he didn't return those. Spoke with patient and advised him to call [facility] and speak to someone in home medical equipment regarding this. He verbalized understanding.

## 2020-10-08 ENCOUNTER — Other Ambulatory Visit: Payer: Self-pay | Admitting: Family Medicine

## 2020-10-08 DIAGNOSIS — F209 Schizophrenia, unspecified: Secondary | ICD-10-CM

## 2020-10-11 ENCOUNTER — Encounter: Payer: Self-pay | Admitting: Urology

## 2020-10-11 ENCOUNTER — Other Ambulatory Visit: Payer: Self-pay

## 2020-10-11 ENCOUNTER — Ambulatory Visit (INDEPENDENT_AMBULATORY_CARE_PROVIDER_SITE_OTHER): Payer: Medicare Other | Admitting: Urology

## 2020-10-11 ENCOUNTER — Other Ambulatory Visit: Payer: Self-pay | Admitting: Family Medicine

## 2020-10-11 VITALS — BP 136/78 | HR 101 | Temp 98.9°F | Ht 72.0 in | Wt 239.0 lb

## 2020-10-11 DIAGNOSIS — R339 Retention of urine, unspecified: Secondary | ICD-10-CM

## 2020-10-11 DIAGNOSIS — R972 Elevated prostate specific antigen [PSA]: Secondary | ICD-10-CM | POA: Diagnosis not present

## 2020-10-11 DIAGNOSIS — F209 Schizophrenia, unspecified: Secondary | ICD-10-CM

## 2020-10-11 DIAGNOSIS — N138 Other obstructive and reflux uropathy: Secondary | ICD-10-CM

## 2020-10-11 DIAGNOSIS — N401 Enlarged prostate with lower urinary tract symptoms: Secondary | ICD-10-CM | POA: Diagnosis not present

## 2020-10-11 LAB — BLADDER SCAN AMB NON-IMAGING: Scan Result: 155

## 2020-10-11 MED ORDER — TAMSULOSIN HCL 0.4 MG PO CAPS
0.4000 mg | ORAL_CAPSULE | Freq: Every day | ORAL | 3 refills | Status: DC
Start: 2020-10-11 — End: 2021-09-12

## 2020-10-11 NOTE — Patient Instructions (Signed)

## 2020-10-11 NOTE — Progress Notes (Signed)
10/11/2020 10:24 AM   Matthew Molina 1951/02/16 [id number]  Referring provider: Fayrene Helper, MD [address] [place],  [place] [address]  followup incomplete emptying  HPI: Matthew Molina is a 69yo here for followup for BPH and incomplete emptying. He was started flomax 0.98m last visit and he notes his stream is now strong. Nocturia 1x. No urgency or frequency. No dribbling. PVR 155cc. No hematuria or dysuria   PMH: Past Medical History:  Diagnosis Date  . Acute blood loss anemia 01/05/2020  . Acute kidney injury ([facility]   . Acute respiratory failure with hypoxia ([facility]   . Arthritis   . COPD (chronic obstructive pulmonary disease) ([facility]   . Depression   . Diabetes mellitus   . Diabetes mellitus without complication ([place]   . Diverticulitis   . Elevated PSA 10/01/2016  . Encounter for support and coordination of transition of care 01/14/2020  . Hypercholesterolemia   . Hyperlipidemia   . Hypertension   . Insomnia 01/05/2016  . Ischemic colitis ([facility] 01/05/2020  . Multiple lung nodules on CT 04/02/2015  . Nicotine addiction   . Obesity   . Oxygen deficiency    qhs  . Schizophrenia ([facility]     Surgical History: Past Surgical History:  Procedure Laterality Date  . CATARACT EXTRACTION W/PHACO Left 11/20/2013   Procedure: CATARACT EXTRACTION PHACO AND INTRAOCULAR LENS PLACEMENT (IOC);  Surgeon: KTonny Branch MD;  Location: [facility] ORS;  Service: Ophthalmology;  Laterality: Left;  CDE:10.26  . CATARACT EXTRACTION W/PHACO Right 12/08/2013   Procedure: RIGHT EYE CATARACT EXTRACTION PHACO AND INTRAOCULAR LENS PLACEMENT ;  Surgeon: KTonny Branch MD;  Location: [facility] ORS;  Service: Ophthalmology;  Laterality: Right;  CDE 12.38  . COLONOSCOPY N/A 04/01/2013   pancolonic diverticulosis, redundant colon, large internal hemorrhoids.   . COLONOSCOPY  2014   INCOMPLETE PREP IN R COLON  . COLONOSCOPY N/A 04/01/2020   Procedure: COLONOSCOPY;  Surgeon: FDanie Binder MD;  Location: [facility] ENDO  SUITE;  Service: Endoscopy;  Laterality: N/A;  8:45am  . ESOPHAGOGASTRODUODENOSCOPY N/A 02/18/2016   stricture at GE junction, moderate erosive gastritis and mild non-erosive duodenitis. +H.pylori. Treated initially with Prevpac. Breath test to check for eradication was positive, and he was prescribed Pylera.   .Marland KitchenEYE SURGERY Left 11/2013   cataract extraction  . POLYPECTOMY  04/01/2020   Procedure: POLYPECTOMY;  Surgeon: FDanie Binder MD;  Location: [facility] ENDO SUITE;  Service: Endoscopy;;  . SAVORY DILATION N/A 02/18/2016   Procedure: SAVORY DILATION;  Surgeon: SDanie Binder MD;  Location: [facility] ENDO SUITE;  Service: Endoscopy;  Laterality: N/A;  . SHOULDER SURGERY      Home Medications:  Allergies as of 10/11/2020      Reactions   Sertraline Hcl    Stomach upset/pain   Wellbutrin [bupropion] Other (See Comments)   Makes stomach hurt      Medication List       Accurate as of October 11, 2020 10:24 AM. If you have any questions, ask your nurse or doctor.        Accu-Chek Aviva Plus test strip Generic drug: glucose blood USE TO TEST ONCE DAILY   Accu-Chek Aviva Plus w/Device Kit accucheck aviva meter Once daily testing DX e11.9   Accu-Chek Softclix Lancets lancets TEST BLOOD SUGAR ONCE DAILY AS DIRECTED.   acetaminophen 500 MG tablet Commonly known as: TYLENOL Take 500-1,000 mg by mouth daily as needed for headache. Takes mostly for HA about 2 x/week.  albuterol 108 (90 Base) MCG/ACT inhaler Commonly known as: VENTOLIN HFA INHALE 2 PUFFS INTO THE LUNGS EVERY 6 HOURS AS NEEDED FOR WHEEZING OR SHORTNESS OF BREATH.   budesonide-formoterol 160-4.5 MCG/ACT inhaler Commonly known as: SYMBICORT Inhale 2 puffs into the lungs 2 (two) times daily.   busPIRone 7.5 MG tablet Commonly known as: BUSPAR Take one tablet by mouth once daily, for anxiety   Colace 100 MG capsule Generic drug: docusate sodium Take 100 mg by mouth daily as needed for mild constipation.   ezetimibe 10 MG  tablet Commonly known as: ZETIA Take 1 tablet (10 mg total) by mouth daily.   glipiZIDE 10 MG 24 hr tablet Commonly known as: GLUCOTROL XL TAKE 1 TABLEY BY MOUTH ONCE DAILY.   HYDROcodone-acetaminophen 5-325 MG tablet Commonly known as: NORCO/VICODIN One tablet every six hours for pain.  Limit 7 days.   ipratropium-albuterol 0.5-2.5 (3) MG/3ML Soln Commonly known as: DUONEB Take by nebulization every 4 (four) hours as needed.   meloxicam 7.5 MG tablet Commonly known as: Mobic Take 1 tablet (7.5 mg total) by mouth daily.   olmesartan 40 MG tablet Commonly known as: BENICAR Take 1 tablet (40 mg total) by mouth daily.   pantoprazole 40 MG tablet Commonly known as: PROTONIX TAKE ONE TABLET BY MOUTHONCE DAILY.   risperiDONE 1 MG tablet Commonly known as: RISPERDAL TAKE (1) TABLET BY MOUTH AT BEDTIME WITH 4MG.   risperidone 4 MG tablet Commonly known as: RISPERDAL TAKE ONE TABLET BY MOUTH IN THE EVENING. TAKE WITH 1MG TABLET.   rosuvastatin 20 MG tablet Commonly known as: Crestor Take 1 tablet (20 mg total) by mouth daily.   tamsulosin 0.4 MG Caps capsule Commonly known as: FLOMAX Take 1 capsule (0.4 mg total) by mouth daily after supper.   traZODone 50 MG tablet Commonly known as: DESYREL Take 1 tablet (50 mg total) by mouth at bedtime as needed. for sleep   triamterene-hydrochlorothiazide 37.5-25 MG tablet Commonly known as: MAXZIDE-25 TAKE 1/2 TABLET BY MOUTH ONCE DAILY.   UNABLE TO FIND Nebulizer mouthpiece and tubing x 1  DX COPD   UNABLE TO FIND XL pullups for daily use DX Incontinence   UNABLE TO FIND Nebulizer machine Mask and tubing x 1  DX J44.9       Allergies:  Allergies  Allergen Reactions  . Sertraline Hcl     Stomach upset/pain  . Wellbutrin [Bupropion] Other (See Comments)    Makes stomach hurt    Family History: Family History  Problem Relation Age of Onset  . Diabetes Mother   . Hypertension Mother   . Stroke Mother   .  Diabetes Sister   . Heart disease Sister   . Kidney disease Father   . Drug abuse Brother   . Colon cancer Neg Hx   . Colon polyps Neg Hx     Social History:  reports that he has been smoking cigarettes. He has a 48.00 pack-year smoking history. He has never used smokeless tobacco. He reports previous alcohol use. He reports that he does not use drugs.  ROS: All other review of systems were reviewed and are negative except what is noted above in HPI  Physical Exam: BP 136/78   Pulse (!) 101   Temp 98.9 F (37.2 C)   Ht 6' (1.829 m)   Wt 239 lb (108.4 kg)   BMI 32.41 kg/m   Constitutional:  Alert and oriented, No acute distress. HEENT: [place] AT, moist mucus membranes.  Trachea midline,  no masses. Cardiovascular: No clubbing, cyanosis, or edema. Respiratory: Normal respiratory effort, no increased work of breathing. GI: Abdomen is soft, nontender, nondistended, no abdominal masses GU: No CVA tenderness.  Lymph: No cervical or inguinal lymphadenopathy. Skin: No rashes, bruises or suspicious lesions. Neurologic: Grossly intact, no focal deficits, moving all 4 extremities. Psychiatric: Normal mood and affect.  Laboratory Data: Lab Results  Component Value Date   WBC 7.3 05/26/2020   HGB 13.2 05/26/2020   HCT 40.3 05/26/2020   MCV 85.6 05/26/2020   PLT 267 05/26/2020    Lab Results  Component Value Date   CREATININE 1.38 (H) 05/26/2020    Lab Results  Component Value Date   PSA 9.3 (H) 05/26/2020   PSA 5.9 (H) 07/05/2017   PSA 4.96 (H) 06/05/2016    No results found for: TESTOSTERONE  Lab Results  Component Value Date   HGBA1C 6.5 (A) 09/13/2020    Urinalysis    Component Value Date/Time   COLORURINE lt. yellow 02/25/2009 1257   APPEARANCEUR Clear 09/08/2020 1206   LABSPEC 1.010 02/25/2009 1257   PHURINE 6.0 02/25/2009 1257   GLUCOSEU Negative 09/08/2020 1206   HGBUR negative 02/25/2009 1257   BILIRUBINUR Negative 09/08/2020 1206   PROTEINUR Negative  09/08/2020 1206   UROBILINOGEN 0.2 02/25/2009 1257   NITRITE Negative 09/08/2020 1206   NITRITE negative 02/25/2009 1257   LEUKOCYTESUR Negative 09/08/2020 1206    Lab Results  Component Value Date   LABMICR Comment 09/08/2020    Pertinent Imaging:  No results found for this or any previous visit.  No results found for this or any previous visit.  No results found for this or any previous visit.  No results found for this or any previous visit.  Results for orders placed during the hospital encounter of 07/20/14  US Renal  Narrative CLINICAL DATA:  Evaluate for renal lesions or cysts.  EXAM: RENAL/URINARY TRACT ULTRASOUND COMPLETE  COMPARISON:  Chest CT 07/14/2014  FINDINGS: Right Kidney:  Length: 16.0 cm. There are multiple hypoechoic and anechoic structures in the right kidney that are suggestive for renal cysts. No evidence for hydronephrosis. Index cyst in the mid right kidney measures 3.5 x 4.4 x 3.8 cm. This appears to be a simple cyst with acoustic enhancement. Anechoic cyst in the upper pole measures 4.0 x 3.7 x 3.7 cm. The lower pole appears to be lobulated with small cysts.  Left Kidney:  Length: 13.4 cm. Question at least one echogenic focus in the left kidney mid pole which could represent a nonobstructive stone. No evidence for hydronephrosis. Question a small cyst in the midpole region.  Bladder:  Normal appearance of the urinary bladder. Bilateral ureter jets are present.  IMPRESSION: Multiple right renal cysts. Largest right renal cyst measures up to 4.4 cm. No suspicious renal lesions.  Cannot exclude a nonobstructive left kidney stone.   Electronically Signed By: Markus Daft M.D. On: 07/20/2014 14:05  No results found for this or any previous visit.  No results found for this or any previous visit.  No results found for this or any previous visit.   Assessment & Plan:    1. Incomplete emptying of bladder -Continue flomax  0.18m daily - Urinalysis, Routine w reflex microscopic - BLADDER SCAN AMB NON-IMAGING  2. Benign prostatic hyperplasia with urinary obstruction -Continue flomax 0.436mdaily   No follow-ups on file.  PaNicolette BangMD  [facility] [place]

## 2020-10-11 NOTE — Progress Notes (Signed)
PVR=155   Urological Symptom Review  Patient is experiencing the following symptoms: None    Review of Systems  Gastrointestinal (upper)  : Negative for upper GI symptoms  Gastrointestinal (lower) : Negative for lower GI symptoms  Constitutional : Negative for symptoms  Skin: Negative for skin symptoms  Eyes: Negative for eye symptoms  Ear/Nose/Throat : Negative for Ear/Nose/Throat symptoms  Hematologic/Lymphatic: Negative for Hematologic/Lymphatic symptoms  Cardiovascular : Negative for cardiovascular symptoms  Respiratory : Negative for respiratory symptoms  Endocrine: Negative for endocrine symptoms  Musculoskeletal: Negative for musculoskeletal symptoms  Neurological: Negative for neurological symptoms  Psychologic: Negative for psychiatric symptoms

## 2020-10-13 ENCOUNTER — Ambulatory Visit (INDEPENDENT_AMBULATORY_CARE_PROVIDER_SITE_OTHER): Payer: Medicare Other | Admitting: Orthotics

## 2020-10-13 ENCOUNTER — Encounter: Payer: Self-pay | Admitting: Podiatry

## 2020-10-13 ENCOUNTER — Other Ambulatory Visit: Payer: Self-pay

## 2020-10-13 ENCOUNTER — Ambulatory Visit: Payer: Medicare Other | Admitting: Podiatry

## 2020-10-13 DIAGNOSIS — M79676 Pain in unspecified toe(s): Secondary | ICD-10-CM

## 2020-10-13 DIAGNOSIS — Q828 Other specified congenital malformations of skin: Secondary | ICD-10-CM

## 2020-10-13 DIAGNOSIS — E1140 Type 2 diabetes mellitus with diabetic neuropathy, unspecified: Secondary | ICD-10-CM | POA: Diagnosis not present

## 2020-10-13 DIAGNOSIS — E1149 Type 2 diabetes mellitus with other diabetic neurological complication: Secondary | ICD-10-CM

## 2020-10-13 DIAGNOSIS — M2141 Flat foot [pes planus] (acquired), right foot: Secondary | ICD-10-CM | POA: Diagnosis not present

## 2020-10-13 DIAGNOSIS — B351 Tinea unguium: Secondary | ICD-10-CM

## 2020-10-13 DIAGNOSIS — M2142 Flat foot [pes planus] (acquired), left foot: Secondary | ICD-10-CM

## 2020-10-13 NOTE — Progress Notes (Signed)
This patient returns to my office for at risk foot care.  This patient requires this care by a professional since this patient will be at risk due to having diabetes mellitus and chronic kidney disease.  Patient has pain in the two calluses on the bottom outside of his feet.  This patient is unable to cut nails himself since the patient cannot reach his nails.These nails are painful walking and wearing shoes. He cannot treat the painful calluses himself. Patient also presents to pick up his new diabetic shoes. This patient presents for at risk foot care today.  General Appearance  Alert, conversant and in no acute stress.  Vascular  Dorsalis pedis and posterior tibial  pulses are palpable  bilaterally.  Capillary return is within normal limits  bilaterally. Temperature is within normal limits  bilaterally.  Neurologic  Senn-Weinstein monofilament wire test diminished   bilaterally. Muscle power within normal limits bilaterally.  Nails Thick disfigured discolored nails with subungual debris  from hallux to fifth toes bilaterally. No evidence of bacterial infection or drainage bilaterally.  Orthopedic  No limitations of motion  feet .  No crepitus or effusions noted.  No bony pathology or digital deformities noted.  Skin  normotropic skin  noted bilaterally.  No signs of infections or ulcers noted.   Porokeratosis sub 5th metabase  B/L.  Onychomycosis  Pain in right toes  Pain in left toes  Porokeratosis  X 2.  Consent was obtained for treatment procedures.   Mechanical debridement of nails 1-5  bilaterally performed with a nail nipper.  Filed with dremel without incident. Debridement of porokeratosis  with # 15 blade.Debride diabetic shoes.  Patient presents today and was dispensed 0ne pair ( two units) of medically necessary extra depth shoes with three pair( six units) of custom molded multiple density inserts. The shoes and the inserts are fitted to the patients ' feet and are noted to fit well and  are free of defect.  Length and width of the shoes are also acceptable.  Patient was given written and verbal  instructions for wearing.  If any concerns arrive with the shoes or inserts, the patient is to call the office.Patient is to follow up with doctor in six weeks.   Return office visit   3 months                  Told patient to return for periodic foot care and evaluation due to potential at risk complications.   Gardiner Barefoot DPM

## 2020-10-14 ENCOUNTER — Telehealth: Payer: Self-pay | Admitting: Orthopaedic Surgery

## 2020-10-14 ENCOUNTER — Ambulatory Visit (INDEPENDENT_AMBULATORY_CARE_PROVIDER_SITE_OTHER): Payer: Medicare Other | Admitting: Orthopaedic Surgery

## 2020-10-14 ENCOUNTER — Encounter: Payer: Self-pay | Admitting: Orthopaedic Surgery

## 2020-10-14 VITALS — BP 158/98 | HR 97 | Ht 73.0 in | Wt 240.0 lb

## 2020-10-14 DIAGNOSIS — M545 Low back pain, unspecified: Secondary | ICD-10-CM

## 2020-10-14 DIAGNOSIS — G8929 Other chronic pain: Secondary | ICD-10-CM | POA: Diagnosis not present

## 2020-10-14 NOTE — Telephone Encounter (Signed)
Done

## 2020-10-14 NOTE — Telephone Encounter (Signed)
Order placed for Dr. Ernestina Patches and pt notified.

## 2020-10-14 NOTE — Telephone Encounter (Signed)
Patient called back following today's appointment (10/14/20) with Dr Luna Glasgow. States he can get transportation to [place] (we verified he can get [place], "through [facility]" due to having Medicaid as one of his insurance coverages.  Does patient need to wait to see Dr Luna Glasgow for the 6 week follow up visit, or can he be referred to Dr Ernestina Patches at this time?  Please advise.

## 2020-10-14 NOTE — Progress Notes (Signed)
Patient Matthew Molina, male DOB:11/22/1951, 69 y.o. SJG:[id number]  Chief Complaint  Patient presents with  . Back Pain    f/u LBP, pain meds help    HPI  Matthew Molina is a 69 y.o. male who has lower back pain.  He is a little better. I had discussed with him about possible epidural injection.  He has transportation problems and says he will have to check on that first before we can schedule him to see Dr. Ernestina Patches.  He has less paresthesias and has no new trauma.  He still hurts.   Body mass index is 31.66 kg/m.  ROS  Review of Systems  Constitutional: Positive for activity change.  Respiratory: Positive for shortness of breath and wheezing.   Musculoskeletal: Positive for arthralgias and back pain.  All other systems reviewed and are negative.   All other systems reviewed and are negative.  The following is a summary of the past history medically, past history surgically, known current medicines, social history and family history.  This information is gathered electronically by the computer from prior information and documentation.  I review this each visit and have found including this information at this point in the chart is beneficial and informative.    Past Medical History:  Diagnosis Date  . Acute blood loss anemia 01/05/2020  . Acute kidney injury ([place])   . Acute respiratory failure with hypoxia ([place])   . Arthritis   . COPD (chronic obstructive pulmonary disease) ([place])   . Depression   . Diabetes mellitus   . Diabetes mellitus without complication ([place])   . Diverticulitis   . Elevated PSA 10/01/2016  . Encounter for support and coordination of transition of care 01/14/2020  . Hypercholesterolemia   . Hyperlipidemia   . Hypertension   . Insomnia 01/05/2016  . Ischemic colitis ([place]) 01/05/2020  . Multiple lung nodules on CT 04/02/2015  . Nicotine addiction   . Obesity   . Oxygen deficiency    qhs  . Schizophrenia [facility])     Past Surgical History:   Procedure Laterality Date  . CATARACT EXTRACTION W/PHACO Left 11/20/2013   Procedure: CATARACT EXTRACTION PHACO AND INTRAOCULAR LENS PLACEMENT (IOC);  Surgeon: Tonny Branch, MD;  Location: [facility] ORS;  Service: Ophthalmology;  Laterality: Left;  CDE:10.26  . CATARACT EXTRACTION W/PHACO Right 12/08/2013   Procedure: RIGHT EYE CATARACT EXTRACTION PHACO AND INTRAOCULAR LENS PLACEMENT ;  Surgeon: Tonny Branch, MD;  Location: [facility] OR[facility];  Service: Ophthalmology;  Laterality: Right;  CDE 12.38  . COLONOSCOPY N/A 04/01/2013   pancolonic diverticulosis, redundant colon, large internal hemorrhoids.   . COLONOSCOPY  2014   INCOMPLETE PREP IN R COLON  . COLONOSCOPY N/A 04/01/2020   Procedure: COLONOSCOPY;  Surgeon: Danie Binder, MD;  Location: [facility] END[facility] SU[facility];  Service: Endoscopy;  Laterality: N/A;  8:45am  . ESOPHAGOGASTRODUODENOSCOPY N/A 02/18/2016   stricture at GE junction, moderate erosive gastritis and mild non-erosive duodenitis. +H.pylori. Treated initially with Prevpac. Breath test to check for eradication was positive, and he was prescribed Pylera.   Marland Kitchen EYE SURGERY Left 11/2013   cataract extraction  . POLYPECTOMY  04/01/2020   Procedure: POLYPECTOMY;  Surgeon: Danie Binder, MD;  Location: [facility];  Service: Endoscopy;;  . SAVORY DILATION N/A 02/18/2016   Procedure: SAVORY DILATION;  Surgeon: Danie Binder, MD;  Location: [facility];  Service: Endoscopy;  Laterality: N/A;  . SHOULDER SURGERY      Family History  Problem Relation Age of  Onset  . Diabetes Mother   . Hypertension Mother   . Stroke Mother   . Diabetes Sister   . Heart disease Sister   . Kidney disease Father   . Drug abuse Brother   . Colon cancer Neg Hx   . Colon polyps Neg Hx     Social History Social History   Tobacco Use  . Smoking status: Current Every Day Smoker    Packs/day: 1.00    Years: 48.00    Pack years: 48.00    Types: Cigarettes    Last attempt to quit: 05/02/2017    Years since quitting: 3.4  .  Smokeless tobacco: Never Used  Vaping Use  . Vaping Use: Never used  Substance Use Topics  . Alcohol use: Not Currently    Alcohol/week: 0.0 standard drinks  . Drug use: No    Allergies  Allergen Reactions  . Sertraline Hcl     Stomach upset/pain  . Wellbutrin [Bupropion] Other (See Comments)    Makes stomach hurt    Current Outpatient Medications  Medication Sig Dispense Refill  . Accu-Chek Softclix Lancets lancets TEST BLOOD SUGAR ONCE DAILY AS DIRECTED. 100 each 5  . acetaminophen (TYLENOL) 500 MG tablet Take 500-1,000 mg by mouth daily as needed for headache. Takes mostly for HA about 2 x/week.     Marland Kitchen albuterol (VENTOLIN HFA) 108 (90 Base) MCG/ACT inhaler INHALE 2 PUFFS INTO THE LUNGS EVERY 6 HOURS AS NEEDED FOR WHEEZING OR SHORTNESS OF BREATH. 8.5 g 0  . Blood Glucose Monitoring Suppl (ACCU-CHEK AVIVA PLUS) w/Device KIT accucheck aviva meter Once daily testing DX e11.9 1 kit 0  . budesonide-formoterol (SYMBICORT) 160-4.5 MCG/ACT inhaler Inhale 2 puffs into the lungs 2 (two) times daily. 1 each 3  . busPIRone (BUSPAR) 7.5 MG tablet Take one tablet by mouth once daily, for anxiety 90 tablet 1  . docusate sodium (COLACE) 100 MG capsule Take 100 mg by mouth daily as needed for mild constipation.     Marland Kitchen ezetimibe (ZETIA) 10 MG tablet Take 1 tablet (10 mg total) by mouth daily. 90 tablet 1  . glipiZIDE (GLUCOTROL XL) 10 MG 24 hr tablet TAKE 1 TABLEY BY MOUTH ONCE DAILY. 90 tablet 1  . glucose blood (ACCU-CHEK AVIVA PLUS) test strip USE TO TEST ONCE DAILY 100 each 5  . HYDROcodone-acetaminophen (NORCO/VICODIN) 5-325 MG tablet One tablet every six hours for pain.  Limit 7 days. 28 tablet 0  . ipratropium-albuterol (DUONEB) 0.5-2.5 (3) MG/3ML SOLN Take by nebulization every 4 (four) hours as needed.    . meloxicam (MOBIC) 7.5 MG tablet Take 1 tablet (7.5 mg total) by mouth daily. 30 tablet 0  . olmesartan (BENICAR) 40 MG tablet Take 1 tablet (40 mg total) by mouth daily. 90 tablet 0  .  pantoprazole (PROTONIX) 40 MG tablet TAKE ONE TABLET BY MOUTHONCE DAILY. 30 tablet 11  . risperiDONE (RISPERDAL) 1 MG tablet TAKE (1) TABLET BY MOUTH AT BEDTIME WITH 4MG. 90 tablet 1  . risperidone (RISPERDAL) 4 MG tablet TAKE ONE TABLET BY MOUTH IN THE EVENING. TAKE WITH 1MG TABLET. 90 tablet 0  . rosuvastatin (CRESTOR) 20 MG tablet Take 1 tablet (20 mg total) by mouth daily. 90 tablet 3  . tamsulosin (FLOMAX) 0.4 MG CAPS capsule Take 1 capsule (0.4 mg total) by mouth daily after supper. 90 capsule 3  . traZODone (DESYREL) 50 MG tablet Take 1 tablet (50 mg total) by mouth at bedtime as needed. for sleep 90 tablet 0  .  triamterene-hydrochlorothiazide (MAXZIDE-25) 37.5-25 MG tablet TAKE 1/2 TABLET BY MOUTH ONCE DAILY. 45 tablet 0  . UNABLE TO FIND Nebulizer mouthpiece and tubing x 1  DX COPD 1 each 0  . UNABLE TO FIND XL pullups for daily use DX Incontinence 100 each 11  . UNABLE TO FIND Nebulizer machine Mask and tubing x 1  DX J44.9 1 each 0   No current facility-administered medications for this visit.     Physical Exam  Blood pressure (!) 158/98, pulse 97, height 6' 1" (1.854 m), weight 240 lb (108.9 kg).  Constitutional: overall normal hygiene, normal nutrition, well developed, normal grooming, normal body habitus. Assistive device:cane  Musculoskeletal: gait and station Limp left, muscle tone and strength are normal, no tremors or atrophy is present.  .  Neurological: coordination overall normal.  Deep tendon reflex/nerve stretch intact.  Sensation normal.  Cranial nerves II-XII intact.   Skin:   Normal overall no scars, lesions, ulcers or rashes. No psoriasis.  Psychiatric: Alert and oriented x 3.  Recent memory intact, remote memory unclear.  Normal mood and affect. Well groomed.  Good eye contact.  Cardiovascular: overall no swelling, no varicosities, no edema bilaterally, normal temperatures of the legs and arms, no clubbing, cyanosis and good capillary  refill.  Lymphatic: palpation is normal.  Spine/Pelvis examination:  Inspection:  Overall, sacoiliac joint benign and hips nontender; without crepitus or defects.   Thoracic spine inspection: Alignment normal without kyphosis present   Lumbar spine inspection:  Alignment  with normal lumbar lordosis, without scoliosis apparent.   Thoracic spine palpation:  without tenderness of spinal processes   Lumbar spine palpation: without tenderness of lumbar area; without tightness of lumbar muscles    Range of Motion:   Lumbar flexion, forward flexion is normal without pain or tenderness    Lumbar extension is full without pain or tenderness   Left lateral bend is normal without pain or tenderness   Right lateral bend is normal without pain or tenderness   Straight leg raising is normal  Strength & tone: normal   Stability overall normal stability  All other systems reviewed and are negative   The patient has been educated about the nature of the problem(s) and counseled on treatment options.  The patient appeared to understand what I have discussed and is in agreement with it.  Encounter Diagnosis  Name Primary?  . Chronic midline low back pain without sciatica Yes    PLAN Call if any problems.  Precautions discussed.  Continue current medications.   Return to clinic 6 weeks   We can schedule him to see Dr. Ernestina Patches if the patient can find reliable transportation.  Electronically Signed Sanjuana Kava, MD 10/28/20219:08 AM

## 2020-10-15 ENCOUNTER — Other Ambulatory Visit: Payer: Self-pay

## 2020-10-15 ENCOUNTER — Telehealth: Payer: Self-pay

## 2020-10-15 ENCOUNTER — Other Ambulatory Visit (HOSPITAL_COMMUNITY): Payer: Self-pay

## 2020-10-15 DIAGNOSIS — Z87891 Personal history of nicotine dependence: Secondary | ICD-10-CM

## 2020-10-15 DIAGNOSIS — E1169 Type 2 diabetes mellitus with other specified complication: Secondary | ICD-10-CM

## 2020-10-15 DIAGNOSIS — Z122 Encounter for screening for malignant neoplasm of respiratory organs: Secondary | ICD-10-CM

## 2020-10-15 DIAGNOSIS — E669 Obesity, unspecified: Secondary | ICD-10-CM

## 2020-10-15 MED ORDER — BLOOD GLUCOSE METER KIT: PACK | 0 refills | Status: DC

## 2020-10-15 NOTE — Progress Notes (Signed)
gluc

## 2020-10-15 NOTE — Telephone Encounter (Signed)
Patient lvm stating his glucose meter was broken. Sent in prescription for new one to [facility]. Patient notified.

## 2020-10-15 NOTE — Progress Notes (Signed)
Patient scheduled for follow-up LDCT on November 26th at [facility].

## 2020-10-26 ENCOUNTER — Ambulatory Visit: Payer: Medicare Other | Admitting: Orthopaedic Surgery

## 2020-11-01 ENCOUNTER — Other Ambulatory Visit: Payer: Self-pay | Admitting: Family Medicine

## 2020-11-01 DIAGNOSIS — M25551 Pain in right hip: Secondary | ICD-10-CM

## 2020-11-01 DIAGNOSIS — J449 Chronic obstructive pulmonary disease, unspecified: Secondary | ICD-10-CM | POA: Diagnosis not present

## 2020-11-02 ENCOUNTER — Other Ambulatory Visit: Payer: Self-pay

## 2020-11-02 DIAGNOSIS — M25551 Pain in right hip: Secondary | ICD-10-CM

## 2020-11-02 MED ORDER — TRAZODONE HCL 50 MG PO TABS
50.0000 mg | ORAL_TABLET | Freq: Every evening | ORAL | 0 refills | Status: DC | PRN
Start: 2020-11-02 — End: 2021-01-03

## 2020-11-02 MED ORDER — MELOXICAM 7.5 MG PO TABS: 7.5000 mg | ORAL_TABLET | Freq: Every day | ORAL | 0 refills | Status: DC

## 2020-11-03 ENCOUNTER — Ambulatory Visit (INDEPENDENT_AMBULATORY_CARE_PROVIDER_SITE_OTHER): Payer: Medicare Other | Admitting: Physical Medicine and Rehabilitation

## 2020-11-03 ENCOUNTER — Other Ambulatory Visit: Payer: Self-pay

## 2020-11-03 ENCOUNTER — Ambulatory Visit: Payer: Self-pay

## 2020-11-03 ENCOUNTER — Encounter: Payer: Self-pay | Admitting: Physical Medicine and Rehabilitation

## 2020-11-03 VITALS — BP 144/93 | HR 112

## 2020-11-03 DIAGNOSIS — M5416 Radiculopathy, lumbar region: Secondary | ICD-10-CM

## 2020-11-03 DIAGNOSIS — M48062 Spinal stenosis, lumbar region with neurogenic claudication: Secondary | ICD-10-CM

## 2020-11-03 MED ORDER — METHYLPREDNISOLONE ACETATE 80 MG/ML IJ SUSP
80.0000 mg | Freq: Once | INTRAMUSCULAR | Status: AC
  Administered 2020-11-03: 80 mg

## 2020-11-03 NOTE — Progress Notes (Signed)
Pt state lower back pain mostly on the right side that travels down his leg. Pt state walking and sitting for a long time makes the pain worse. Pt state he takes pain meds to help ease.  Numeric Pain Rating Scale and Functional Assessment Average Pain 2   In the last MONTH (on 0-10 scale) has pain interfered with the following?  1. General activity like being  able to carry out your everyday physical activities such as walking, climbing stairs, carrying groceries, or moving a chair?  Rating(9)   +Driver, -BT, -Dye Allergies.

## 2020-11-06 ENCOUNTER — Other Ambulatory Visit: Payer: Self-pay | Admitting: Family Medicine

## 2020-11-06 DIAGNOSIS — J42 Unspecified chronic bronchitis: Secondary | ICD-10-CM | POA: Diagnosis not present

## 2020-11-06 DIAGNOSIS — J441 Chronic obstructive pulmonary disease with (acute) exacerbation: Secondary | ICD-10-CM | POA: Diagnosis not present

## 2020-11-06 DIAGNOSIS — J449 Chronic obstructive pulmonary disease, unspecified: Secondary | ICD-10-CM | POA: Diagnosis not present

## 2020-11-12 ENCOUNTER — Ambulatory Visit (HOSPITAL_COMMUNITY)
Admission: RE | Admit: 2020-11-12 | Discharge: 2020-11-12 | Disposition: A | Discharge status: Home / Self Care | Payer: Medicare Other | Source: Ambulatory Visit | Attending: Oncology | Admitting: Oncology

## 2020-11-12 ENCOUNTER — Other Ambulatory Visit: Payer: Self-pay

## 2020-11-12 DIAGNOSIS — F17210 Nicotine dependence, cigarettes, uncomplicated: Secondary | ICD-10-CM | POA: Diagnosis not present

## 2020-11-12 DIAGNOSIS — Z122 Encounter for screening for malignant neoplasm of respiratory organs: Secondary | ICD-10-CM | POA: Diagnosis not present

## 2020-11-12 DIAGNOSIS — Z87891 Personal history of nicotine dependence: Secondary | ICD-10-CM | POA: Insufficient documentation

## 2020-11-12 DIAGNOSIS — J441 Chronic obstructive pulmonary disease with (acute) exacerbation: Secondary | ICD-10-CM | POA: Diagnosis not present

## 2020-11-12 DIAGNOSIS — J449 Chronic obstructive pulmonary disease, unspecified: Secondary | ICD-10-CM | POA: Diagnosis not present

## 2020-11-16 ENCOUNTER — Encounter (HOSPITAL_COMMUNITY): Payer: Self-pay

## 2020-11-16 NOTE — Progress Notes (Signed)
Patient notified of LDCT Lung Cancer Screening Results via mail with the recommendation to follow-up in 12 months. Patient's referring provider has been sent a copy of results. Results are as follows:  IMPRESSION: 1. Lung-RADS 2, benign appearance or behavior. Continue annual screening with low-dose chest CT without contrast in 12 months. 2. Three-vessel coronary atherosclerosis. 3. Stable ectatic 4.4 cm ascending thoracic aorta, which can be reassessed on follow-up screening chest CT in 12 months. 4. Small hiatal hernia. Nonspecific chronic mild circumferential lower thoracic esophageal wall thickening with mildly patulous thoracic esophagus with esophageal fluid level, most commonly due to chronic reflux esophagitis and chronic reflux related esophageal dysmotility. Upper endoscopic correlation may be obtained as clinically warranted. 5. Trace dependent left pleural effusion. 6. Aortic Atherosclerosis (ICD10-I70.0) and Emphysema (ICD10-J43.9).

## 2020-11-16 NOTE — Progress Notes (Signed)
Cardiology Office Note    Date:  11/22/2020   ID:  Matthew Molina, DOB 04/10/1951, MRN [id number]  PCP:  Fayrene Helper, MD  Cardiologist: No primary care provider on file. EPS: None  No chief complaint on file.   History of Present Illness:  Matthew Molina is a 69 y.o. male with history of hypertension, HLD, DM type II, tobacco abuse.  he was seen 01/2018 after lung cancer screening CT in 2018 demonstrated aortic atherosclerosis as well as atherosclerosis of the great vessels of the mediastinum and coronary arteries including calcified atherosclerotic plaque in the left main, LAD of circumflex and RCA.  LDL was 91 at that time.  Normal NST 2016, family history of CAD sister had a stent in her 32s.  NST ordered was a low risk study with normal LVEF 65%.  Patient comes in for f/u. Has acid reflux relieved with Tums. Uses nebulizer treatment for shortness of breath. Still smokes 1-2 ppd. Can't walk because of back problems and shortness of breath. Denies chest tightness pressure.    Past Medical History:  Diagnosis Date  . Acute blood loss anemia 01/05/2020  . Acute kidney injury ([place])   . Acute respiratory failure with hypoxia ([place])   . Arthritis   . COPD (chronic obstructive pulmonary disease) ([place])   . Depression   . Diabetes mellitus   . Diabetes mellitus without complication ([place])   . Diverticulitis   . Elevated PSA 10/01/2016  . Encounter for support and coordination of transition of care 01/14/2020  . Hypercholesterolemia   . Hyperlipidemia   . Hypertension   . Insomnia 01/05/2016  . Ischemic colitis ([place]) 01/05/2020  . Multiple lung nodules on CT 04/02/2015  . Nicotine addiction   . Obesity   . Oxygen deficiency    qhs  . Schizophrenia [facility])     Past Surgical History:  Procedure Laterality Date  . CATARACT EXTRACTION W/PHACO Left 11/20/2013   Procedure: CATARACT EXTRACTION PHACO AND INTRAOCULAR LENS PLACEMENT (IOC);  Surgeon: Tonny Branch, MD;  Location: [facility] ORS;   Service: Ophthalmology;  Laterality: Left;  CDE:10.26  . CATARACT EXTRACTION W/PHACO Right 12/08/2013   Procedure: RIGHT EYE CATARACT EXTRACTION PHACO AND INTRAOCULAR LENS PLACEMENT ;  Surgeon: Tonny Branch, MD;  Location: [facility];  Service: Ophthalmology;  Laterality: Right;  CDE 12.38  . COLONOSCOPY N/A 04/01/2013   pancolonic diverticulosis, redundant colon, large internal hemorrhoids.   . COLONOSCOPY  2014   INCOMPLETE PREP IN R COLON  . COLONOSCOPY N/A 04/01/2020   Procedure: COLONOSCOPY;  Surgeon: Danie Binder, MD;  Location: [facility];  Service: Endoscopy;  Laterality: N/A;  8:45am  . ESOPHAGOGASTRODUODENOSCOPY N/A 02/18/2016   stricture at GE junction, moderate erosive gastritis and mild non-erosive duodenitis. +H.pylori. Treated initially with Prevpac. Breath test to check for eradication was positive, and he was prescribed Pylera.   Marland Kitchen EYE SURGERY Left 11/2013   cataract extraction  . POLYPECTOMY  04/01/2020   Procedure: POLYPECTOMY;  Surgeon: Danie Binder, MD;  Location: [facility];  Service: Endoscopy;;  . SAVORY DILATION N/A 02/18/2016   Procedure: SAVORY DILATION;  Surgeon: Danie Binder, MD;  Location: [facility];  Service: Endoscopy;  Laterality: N/A;  . SHOULDER SURGERY      Current Medications: Current Meds  Medication Sig  . Accu-Chek Softclix Lancets lancets TEST BLOOD SUGAR ONCE DAILY AS DIRECTED.  Marland Kitchen acetaminophen (TYLENOL) 500 MG tablet Take 500-1,000 mg by mouth daily as needed for headache.  Takes mostly for HA about 2 x/week.   Marland Kitchen albuterol (VENTOLIN HFA) 108 (90 Base) MCG/ACT inhaler INHALE 2 PUFFS INTO THE LUNGS EVERY 6 HOURS AS NEEDED FOR WHEEZING OR SHORTNESS OF BREATH.  . blood glucose meter kit and supplies Dispense based on patient and insurance preference.Use to check blood sugar once daily (FOR ICD-10 E10.9, E11.9).  Marland Kitchen Blood Glucose Monitoring Suppl (ACCU-CHEK AVIVA PLUS) w/Device KIT accucheck aviva meter Once daily testing DX e11.9  .  budesonide-formoterol (SYMBICORT) 160-4.5 MCG/ACT inhaler Inhale 2 puffs into the lungs 2 (two) times daily.  . busPIRone (BUSPAR) 7.5 MG tablet Take one tablet by mouth once daily, for anxiety  . docusate sodium (COLACE) 100 MG capsule Take 100 mg by mouth daily as needed for mild constipation.   Marland Kitchen ezetimibe (ZETIA) 10 MG tablet Take 1 tablet (10 mg total) by mouth daily.  Marland Kitchen glipiZIDE (GLUCOTROL XL) 10 MG 24 hr tablet TAKE 1 TABLEY BY MOUTH ONCE DAILY.  Marland Kitchen glucose blood (ACCU-CHEK AVIVA PLUS) test strip USE TO TEST ONCE DAILY  . HYDROcodone-acetaminophen (NORCO/VICODIN) 5-325 MG tablet One tablet every six hours for pain.  Limit 7 days.  Marland Kitchen ipratropium-albuterol (DUONEB) 0.5-2.5 (3) MG/3ML SOLN Take by nebulization every 4 (four) hours as needed.  . meloxicam (MOBIC) 7.5 MG tablet Take 1 tablet (7.5 mg total) by mouth daily.  Marland Kitchen olmesartan (BENICAR) 40 MG tablet Take 1 tablet (40 mg total) by mouth daily.  . pantoprazole (PROTONIX) 40 MG tablet TAKE ONE TABLET BY MOUTHONCE DAILY.  Marland Kitchen risperiDONE (RISPERDAL) 1 MG tablet TAKE (1) TABLET BY MOUTH AT BEDTIME WITH $RemoveBefo'4MG'OJkfPgYivuI$ .  . risperidone (RISPERDAL) 4 MG tablet TAKE ONE TABLET BY MOUTH IN THE EVENING. TAKE WITH $RemoveB'1MG'UKkmiSqD$  TABLET.  Marland Kitchen rosuvastatin (CRESTOR) 20 MG tablet Take 1 tablet (20 mg total) by mouth daily.  . tamsulosin (FLOMAX) 0.4 MG CAPS capsule Take 1 capsule (0.4 mg total) by mouth daily after supper.  . traZODone (DESYREL) 50 MG tablet Take 1 tablet (50 mg total) by mouth at bedtime as needed. for sleep  . UNABLE TO FIND Nebulizer mouthpiece and tubing x 1  DX COPD  . UNABLE TO FIND XL pullups for daily use DX Incontinence  . UNABLE TO FIND Nebulizer machine Mask and tubing x 1  DX J44.9     Allergies:   Sertraline hcl and Wellbutrin [bupropion]   Social History   Socioeconomic History  . Marital status: Divorced    Spouse name: Not on file  . Number of children: 2  . Years of education: Not on file  . Highest education level: Not on file   Occupational History  . Occupation: retired from [employer]  . Smoking status: Current Every Day Smoker    Packs/day: 1.00    Years: 48.00    Pack years: 48.00    Types: Cigarettes    Last attempt to quit: 05/02/2017    Years since quitting: 3.5  . Smokeless tobacco: Never Used  Vaping Use  . Vaping Use: Never used  Substance and Sexual Activity  . Alcohol use: Not Currently    Alcohol/week: 0.0 standard drinks  . Drug use: No  . Sexual activity: Not Currently    Birth control/protection: None  Other Topics Concern  . Not on file  Social History Narrative   ** Merged History Encounter **       ** Merged History Encounter **       Social Determinants of Radio broadcast assistant  Strain: Low Risk   . Difficulty of Paying Living Expenses: Not very hard  Food Insecurity: No Food Insecurity  . Worried About Charity fundraiser in the Last Year: Never true  . Ran Out of Food in the Last Year: Never true  Transportation Needs: No Transportation Needs  . Lack of Transportation (Medical): No  . Lack of Transportation (Non-Medical): No  Physical Activity: Insufficiently Active  . Days of Exercise per Week: 2 days  . Minutes of Exercise per Session: 10 min  Stress: No Stress Concern Present  . Feeling of Stress : Not at all  Social Connections: Socially Isolated  . Frequency of Communication with Friends and Family: More than three times a week  . Frequency of Social Gatherings with Friends and Family: Three times a week  . Attends Religious Services: Never  . Active Member of Clubs or Organizations: No  . Attends Archivist Meetings: Never  . Marital Status: Divorced     Family History:  The patient's family history includes Diabetes in his mother and sister; Drug abuse in his brother; Heart disease in his sister; Hypertension in his mother; Kidney disease in his father; Stroke in his mother.   ROS:   Please see the history of present illness.     ROS All other systems reviewed and are negative.   PHYSICAL EXAM:   VS:  BP 130/80   Pulse (!) 102   Ht $R'6\' 1"'hy$  (1.854 m)   Wt 246 lb 9.6 oz (111.9 kg)   SpO2 95%   BMI 32.53 kg/m   Physical Exam  GEN: Obese, in no acute distress  Neck: no JVD, carotid bruits, or masses Cardiac:RRR; no murmurs, rubs, or gallops  Respiratory:  clear to auscultation bilaterally, normal work of breathing GI: soft, nontender, nondistended, + BS Ext: without cyanosis, clubbing, or edema, Good distal pulses bilaterally Neuro:  Alert and Oriented x 3 Psych: euthymic mood, full affect  Wt Readings from Last 3 Encounters:  11/22/20 246 lb 9.6 oz (111.9 kg)  10/14/20 240 lb (108.9 kg)  10/11/20 239 lb (108.4 kg)      Studies/Labs Reviewed:   EKG:  EKG is not ordered today.   Recent Labs: 01/14/2020: Magnesium 2.1 05/26/2020: ALT 37; BUN 12; Creat 1.38; Hemoglobin 13.2; Platelets 267; Potassium 3.8; Sodium 135; TSH 1.31   Lipid Panel    Component Value Date/Time   CHOL 183 05/26/2020 1149   TRIG 341 (H) 05/26/2020 1149   HDL 34 (L) 05/26/2020 1149   CHOLHDL 5.4 (H) 05/26/2020 1149   VLDL 37 (H) 07/05/2017 0742   LDLCALC 104 (H) 05/26/2020 1149    Additional studies/ records that were reviewed today include:  NST 2019 There was no ST segment deviation noted during stress. Defect 1: There is a small defect of mild severity present in the mid inferior and mid inferolateral location. This may represent a small ischemic territory vs variable soft tissue attenuation. Overall, this is a low risk study. Nuclear stress EF: 65%.     ASSESSMENT:    1. Atherosclerosis of native coronary artery of native heart without angina pectoris   2. Essential hypertension   3. Hyperlipidemia, unspecified hyperlipidemia type   4. DOE (dyspnea on exertion)   5. Tobacco abuse      PLAN:  In order of problems listed above:  Coronary artery calcifications on CT NST 2019 low risk normal LV function. No  recent chest pain. Smoking cessation discussed  Hypertension BP controlled  Hyperlipidemia LDL 104 on Crestor and zetia. Goal less than 70. Managed by PCP  Chronic dyspnea on exertion felt secondary to COPD  Tobacco abuse importance of smoking  Cessation discussed.  Medication Adjustments/Labs and Tests Ordered: Current medicines are reviewed at length with the patient today.  Concerns regarding medicines are outlined above.  Medication changes, Labs and Tests ordered today are listed in the Patient Instructions below. Patient Instructions  Medication Instructions:  Your physician recommends that you continue on your current medications as directed. Please refer to the Current Medication list given to you today.  *If you need a refill on your cardiac medications before your next appointment, please call your pharmacy*   Lab Work: None today If you have labs (blood work) drawn today and your tests are completely normal, you will receive your results only by: Marland Kitchen MyChart Message (if you have MyChart) OR . A paper copy in the mail If you have any lab test that is abnormal or we need to change your treatment, we will call you to review the results.   Testing/Procedures: None today   Follow-Up: At [facility], you and your health needs are our priority.  As part of our continuing mission to provide you with exceptional heart care, we have created designated Provider Care Teams.  These Care Teams include your primary Cardiologist (physician) and Advanced Practice Providers (APPs -  Physician Assistants and Nurse Practitioners) who all work together to provide you with the care you need, when you need it.  We recommend signing up for the patient portal called "MyChart".  Sign up information is provided on this After Visit Summary.  MyChart is used to connect with patients for Virtual Visits (Telemedicine).  Patients are able to view lab/test results, encounter notes, upcoming  appointments, etc.  Non-urgent messages can be sent to your provider as well.   To learn more about what you can do with MyChart, go to [email or web address].    Your next appointment:   1 year(s)  The format for your next appointment:   In Person  Provider:   See Cardiologist in 1 year.    Other Instructions Your physician discussed the hazards of tobacco use. Tobacco use cessation is recommended and techniques and options to help you quit were discussed.         Signed, Ermalinda Barrios, PA-C  11/22/2020 12:21 PM    [facility] [address], [place], [place]  [address] Phone: [phone]; Fax: [phone]

## 2020-11-22 ENCOUNTER — Other Ambulatory Visit: Payer: Self-pay

## 2020-11-22 ENCOUNTER — Encounter: Payer: Self-pay | Admitting: Physician Assistant

## 2020-11-22 ENCOUNTER — Ambulatory Visit (INDEPENDENT_AMBULATORY_CARE_PROVIDER_SITE_OTHER): Payer: Medicare Other | Admitting: Physician Assistant

## 2020-11-22 VITALS — BP 130/80 | HR 102 | Ht 73.0 in | Wt 246.6 lb

## 2020-11-22 DIAGNOSIS — I2510 Atherosclerotic heart disease of native coronary artery without angina pectoris: Secondary | ICD-10-CM | POA: Diagnosis not present

## 2020-11-22 DIAGNOSIS — E785 Hyperlipidemia, unspecified: Secondary | ICD-10-CM | POA: Diagnosis not present

## 2020-11-22 DIAGNOSIS — I10 Essential (primary) hypertension: Secondary | ICD-10-CM | POA: Diagnosis not present

## 2020-11-22 DIAGNOSIS — R0600 Dyspnea, unspecified: Secondary | ICD-10-CM | POA: Diagnosis not present

## 2020-11-22 DIAGNOSIS — R0609 Other forms of dyspnea: Secondary | ICD-10-CM

## 2020-11-22 DIAGNOSIS — Z720 Tobacco use: Secondary | ICD-10-CM | POA: Diagnosis not present

## 2020-11-22 NOTE — Patient Instructions (Signed)
Medication Instructions:  Your physician recommends that you continue on your current medications as directed. Please refer to the Current Medication list given to you today.  *If you need a refill on your cardiac medications before your next appointment, please call your pharmacy*   Lab Work: None today If you have labs (blood work) drawn today and your tests are completely normal, you will receive your results only by: Marland Kitchen MyChart Message (if you have MyChart) OR . A paper copy in the mail If you have any lab test that is abnormal or we need to change your treatment, we will call you to review the results.   Testing/Procedures: None today   Follow-Up: At [facility], you and your health needs are our priority.  As part of our continuing mission to provide you with exceptional heart care, we have created designated Provider Care Teams.  These Care Teams include your primary Cardiologist (physician) and Advanced Practice Providers (APPs -  Physician Assistants and Nurse Practitioners) who all work together to provide you with the care you need, when you need it.  We recommend signing up for the patient portal called "MyChart".  Sign up information is provided on this After Visit Summary.  MyChart is used to connect with patients for Virtual Visits (Telemedicine).  Patients are able to view lab/test results, encounter notes, upcoming appointments, etc.  Non-urgent messages can be sent to your provider as well.   To learn more about what you can do with MyChart, go to [email or web address].    Your next appointment:   1 year(s)  The format for your next appointment:   In Person  Provider:   See Cardiologist in 1 year.    Other Instructions Your physician discussed the hazards of tobacco use. Tobacco use cessation is recommended and techniques and options to help you quit were discussed.

## 2020-11-25 ENCOUNTER — Ambulatory Visit: Payer: Medicare Other | Admitting: Orthopaedic Surgery

## 2020-11-29 ENCOUNTER — Other Ambulatory Visit: Payer: Self-pay | Admitting: Family Medicine

## 2020-11-29 DIAGNOSIS — M25552 Pain in left hip: Secondary | ICD-10-CM

## 2020-11-29 DIAGNOSIS — E669 Obesity, unspecified: Secondary | ICD-10-CM

## 2020-11-29 DIAGNOSIS — M25551 Pain in right hip: Secondary | ICD-10-CM

## 2020-11-29 DIAGNOSIS — E1169 Type 2 diabetes mellitus with other specified complication: Secondary | ICD-10-CM

## 2020-12-01 DIAGNOSIS — J449 Chronic obstructive pulmonary disease, unspecified: Secondary | ICD-10-CM | POA: Diagnosis not present

## 2020-12-06 DIAGNOSIS — J449 Chronic obstructive pulmonary disease, unspecified: Secondary | ICD-10-CM | POA: Diagnosis not present

## 2020-12-06 DIAGNOSIS — J42 Unspecified chronic bronchitis: Secondary | ICD-10-CM | POA: Diagnosis not present

## 2020-12-06 DIAGNOSIS — J441 Chronic obstructive pulmonary disease with (acute) exacerbation: Secondary | ICD-10-CM | POA: Diagnosis not present

## 2020-12-08 ENCOUNTER — Telehealth (INDEPENDENT_AMBULATORY_CARE_PROVIDER_SITE_OTHER): Payer: Medicare Other | Admitting: Nurse Practitioner

## 2020-12-08 ENCOUNTER — Telehealth: Payer: Medicare Other | Admitting: Family Medicine

## 2020-12-08 ENCOUNTER — Encounter: Payer: Self-pay | Admitting: Nurse Practitioner

## 2020-12-08 ENCOUNTER — Other Ambulatory Visit: Payer: Self-pay | Admitting: Family Medicine

## 2020-12-08 ENCOUNTER — Other Ambulatory Visit: Payer: Self-pay

## 2020-12-08 DIAGNOSIS — J449 Chronic obstructive pulmonary disease, unspecified: Secondary | ICD-10-CM

## 2020-12-08 DIAGNOSIS — J441 Chronic obstructive pulmonary disease with (acute) exacerbation: Secondary | ICD-10-CM | POA: Diagnosis not present

## 2020-12-08 MED ORDER — PREDNISONE 20 MG PO TABS: 40.0000 mg | ORAL_TABLET | Freq: Every day | ORAL | 0 refills | Status: AC

## 2020-12-08 MED ORDER — AMOXICILLIN-POT CLAVULANATE 875-125 MG PO TABS: 1.0000 | ORAL_TABLET | Freq: Two times a day (BID) | ORAL | 0 refills | Status: DC

## 2020-12-08 NOTE — Progress Notes (Signed)
Acute Office Visit  Subjective:    Patient ID: Matthew Molina, male    DOB: April 11, 1951, 69 y.o.   MRN: [id number]  Chief Complaint  Patient presents with  . Cough    Dry cough x 3 days   . Sore Throat  . Wheezing    HPI Patient is in today for cough.  Symptoms started 3 days ago.  He is having non-productive cough, sore throat, and wheezing.  He endorses SOB and nasal congestion.  He has been taking diabetic tussin.  Denies fever or sinus pain/pressure.    Past Medical History:  Diagnosis Date  . Acute blood loss anemia 01/05/2020  . Acute kidney injury ([place])   . Acute respiratory failure with hypoxia ([place])   . Arthritis   . COPD (chronic obstructive pulmonary disease) ([place])   . Depression   . Diabetes mellitus   . Diabetes mellitus without complication ([place])   . Diverticulitis   . Elevated PSA 10/01/2016  . Encounter for support and coordination of transition of care 01/14/2020  . Hypercholesterolemia   . Hyperlipidemia   . Hypertension   . Insomnia 01/05/2016  . Ischemic colitis ([place]) 01/05/2020  . Multiple lung nodules on CT 04/02/2015  . Nicotine addiction   . Obesity   . Oxygen deficiency    qhs  . Schizophrenia [facility])     Past Surgical History:  Procedure Laterality Date  . CATARACT EXTRACTION W/PHACO Left 11/20/2013   Procedure: CATARACT EXTRACTION PHACO AND INTRAOCULAR LENS PLACEMENT (IOC);  Surgeon: Tonny Branch, MD;  Location: [facility];  Service: Ophthalmology;  Laterality: Left;  CDE:10.26  . CATARACT EXTRACTION W/PHACO Right 12/08/2013   Procedure: RIGHT EYE CATARACT EXTRACTION PHACO AND INTRAOCULAR LENS PLACEMENT ;  Surgeon: Tonny Branch, MD;  Location: [facility];  Service: Ophthalmology;  Laterality: Right;  CDE 12.38  . COLONOSCOPY N/A 04/01/2013   pancolonic diverticulosis, redundant colon, large internal hemorrhoids.   . COLONOSCOPY  2014   INCOMPLETE PREP IN R COLON  . COLONOSCOPY N/A 04/01/2020   Procedure: COLONOSCOPY;  Surgeon: Danie Binder, MD;   Location: [facility];  Service: Endoscopy;  Laterality: N/A;  8:45am  . ESOPHAGOGASTRODUODENOSCOPY N/A 02/18/2016   stricture at GE junction, moderate erosive gastritis and mild non-erosive duodenitis. +H.pylori. Treated initially with Prevpac. Breath test to check for eradication was positive, and he was prescribed Pylera.   Marland Kitchen EYE SURGERY Left 11/2013   cataract extraction  . POLYPECTOMY  04/01/2020   Procedure: POLYPECTOMY;  Surgeon: Danie Binder, MD;  Location: [facility];  Service: Endoscopy;;  . SAVORY DILATION N/A 02/18/2016   Procedure: SAVORY DILATION;  Surgeon: Danie Binder, MD;  Location: [facility];  Service: Endoscopy;  Laterality: N/A;  . SHOULDER SURGERY      Family History  Problem Relation Age of Onset  . Diabetes Mother   . Hypertension Mother   . Stroke Mother   . Diabetes Sister   . Heart disease Sister   . Kidney disease Father   . Drug abuse Brother   . Colon cancer Neg Hx   . Colon polyps Neg Hx     Social History   Socioeconomic History  . Marital status: Divorced    Spouse name: Not on file  . Number of children: 2  . Years of education: Not on file  . Highest education level: Not on file  Occupational History  . Occupation: retired from [employer]  . Smoking status:  Current Every Day Smoker    Packs/day: 1.00    Years: 48.00    Pack years: 48.00    Types: Cigarettes    Last attempt to quit: 05/02/2017    Years since quitting: 3.6  . Smokeless tobacco: Never Used  Vaping Use  . Vaping Use: Never used  Substance and Sexual Activity  . Alcohol use: Not Currently    Alcohol/week: 0.0 standard drinks  . Drug use: No  . Sexual activity: Not Currently    Birth control/protection: None  Other Topics Concern  . Not on file  Social History Narrative   ** Merged History Encounter **       ** Merged History Encounter **       Social Determinants of Health   Financial Resource Strain: Low Risk   . Difficulty of Paying Living  Expenses: Not very hard  Food Insecurity: No Food Insecurity  . Worried About Charity fundraiser in the Last Year: Never true  . Ran Out of Food in the Last Year: Never true  Transportation Needs: No Transportation Needs  . Lack of Transportation (Medical): No  . Lack of Transportation (Non-Medical): No  Physical Activity: Insufficiently Active  . Days of Exercise per Week: 2 days  . Minutes of Exercise per Session: 10 min  Stress: No Stress Concern Present  . Feeling of Stress : Not at all  Social Connections: Socially Isolated  . Frequency of Communication with Friends and Family: More than three times a week  . Frequency of Social Gatherings with Friends and Family: Three times a week  . Attends Religious Services: Never  . Active Member of Clubs or Organizations: No  . Attends Archivist Meetings: Never  . Marital Status: Divorced  Human resources officer Violence: Not on file    Outpatient Medications Prior to Visit  Medication Sig Dispense Refill  . ACCU-CHEK GUIDE test strip USE AS DIRECTED TO CHECK BLOOD SUGAR ONCE DAILY. 50 strip 0  . Accu-Chek Softclix Lancets lancets TEST BLOOD SUGAR ONCE DAILY AS DIRECTED. 100 each 5  . acetaminophen (TYLENOL) 500 MG tablet Take 500-1,000 mg by mouth daily as needed for headache. Takes mostly for HA about 2 x/week.    Marland Kitchen albuterol (VENTOLIN HFA) 108 (90 Base) MCG/ACT inhaler INHALE 2 PUFFS INTO THE LUNGS EVERY 6 HOURS AS NEEDED FOR WHEEZING OR SHORTNESS OF BREATH. 8.5 g 0  . blood glucose meter kit and supplies Dispense based on patient and insurance preference.Use to check blood sugar once daily (FOR ICD-10 E10.9, E11.9). 1 each 0  . Blood Glucose Monitoring Suppl (ACCU-CHEK AVIVA PLUS) w/Device KIT accucheck aviva meter Once daily testing DX e11.9 1 kit 0  . budesonide-formoterol (SYMBICORT) 160-4.5 MCG/ACT inhaler Inhale 2 puffs into the lungs 2 (two) times daily. 1 each 3  . busPIRone (BUSPAR) 7.5 MG tablet Take one tablet by mouth  once daily, for anxiety 90 tablet 1  . docusate sodium (COLACE) 100 MG capsule Take 100 mg by mouth daily as needed for mild constipation.     Marland Kitchen ezetimibe (ZETIA) 10 MG tablet Take 1 tablet (10 mg total) by mouth daily. 90 tablet 1  . glipiZIDE (GLUCOTROL XL) 10 MG 24 hr tablet TAKE 1 TABLEY BY MOUTH ONCE DAILY. 90 tablet 1  . HYDROcodone-acetaminophen (NORCO/VICODIN) 5-325 MG tablet One tablet every six hours for pain.  Limit 7 days. 28 tablet 0  . ipratropium-albuterol (DUONEB) 0.5-2.5 (3) MG/3ML SOLN Take by nebulization every 4 (four) hours as  needed.    . meloxicam (MOBIC) 7.5 MG tablet TAKE 1 TABLET BY MOUTH ONCE DAILY. 30 tablet 0  . olmesartan (BENICAR) 40 MG tablet Take 1 tablet (40 mg total) by mouth daily. 90 tablet 0  . pantoprazole (PROTONIX) 40 MG tablet TAKE ONE TABLET BY MOUTHONCE DAILY. 30 tablet 11  . pravastatin (PRAVACHOL) 20 MG tablet TAKE 1 TABLET BY MOUTH ONCE DAILY AT BEDTIME FOR CHOLESTEROL. 90 tablet 0  . risperiDONE (RISPERDAL) 1 MG tablet TAKE (1) TABLET BY MOUTH AT BEDTIME WITH $RemoveBefo'4MG'qiFhTWeTUEh$ . 90 tablet 1  . risperidone (RISPERDAL) 4 MG tablet TAKE ONE TABLET BY MOUTH IN THE EVENING. TAKE WITH $RemoveB'1MG'NasLOtEY$  TABLET. 90 tablet 0  . rosuvastatin (CRESTOR) 20 MG tablet Take 1 tablet (20 mg total) by mouth daily. 90 tablet 3  . tamsulosin (FLOMAX) 0.4 MG CAPS capsule Take 1 capsule (0.4 mg total) by mouth daily after supper. 90 capsule 3  . traZODone (DESYREL) 50 MG tablet Take 1 tablet (50 mg total) by mouth at bedtime as needed. for sleep 90 tablet 0  . UNABLE TO FIND Nebulizer mouthpiece and tubing x 1  DX COPD 1 each 0  . UNABLE TO FIND XL pullups for daily use DX Incontinence 100 each 11  . UNABLE TO FIND Nebulizer machine Mask and tubing x 1  DX J44.9 1 each 0   No facility-administered medications prior to visit.    Allergies  Allergen Reactions  . Sertraline Hcl     Stomach upset/pain  . Wellbutrin [Bupropion] Other (See Comments)    Makes stomach hurt    Review of  Systems  Constitutional: Negative.   HENT: Positive for congestion and sore throat.   Respiratory: Positive for cough, shortness of breath and wheezing.   Cardiovascular: Negative.        Objective:    Physical Exam  There were no vitals taken for this visit. Wt Readings from Last 3 Encounters:  11/22/20 246 lb 9.6 oz (111.9 kg)  10/14/20 240 lb (108.9 kg)  10/11/20 239 lb (108.4 kg)    There are no preventive care reminders to display for this patient.  There are no preventive care reminders to display for this patient.   Lab Results  Component Value Date   TSH 1.31 05/26/2020   Lab Results  Component Value Date   WBC 7.3 05/26/2020   HGB 13.2 05/26/2020   HCT 40.3 05/26/2020   MCV 85.6 05/26/2020   PLT 267 05/26/2020   Lab Results  Component Value Date   NA 135 05/26/2020   K 3.8 05/26/2020   CO2 28 05/26/2020   GLUCOSE 140 (H) 05/26/2020   BUN 12 05/26/2020   CREATININE 1.38 (H) 05/26/2020   BILITOT 0.5 05/26/2020   ALKPHOS 70 03/10/2020   AST 33 05/26/2020   ALT 37 05/26/2020   PROT 7.6 05/26/2020   ALBUMIN 4.3 03/10/2020   CALCIUM 10.6 (H) 05/26/2020   ANIONGAP 13 03/10/2020   Lab Results  Component Value Date   CHOL 183 05/26/2020   Lab Results  Component Value Date   HDL 34 (L) 05/26/2020   Lab Results  Component Value Date   LDLCALC 104 (H) 05/26/2020   Lab Results  Component Value Date   TRIG 341 (H) 05/26/2020   Lab Results  Component Value Date   CHOLHDL 5.4 (H) 05/26/2020   Lab Results  Component Value Date   HGBA1C 6.5 (A) 09/13/2020       Assessment & Plan:   Problem  List Items Addressed This Visit      Respiratory   COPD with acute exacerbation ([place])    -has been symptomatic x 3 days -has SOB despite using is duonebs and symbicort -Rx. Prednisone -Rx. augmentin          No orders of the defined types were placed in this encounter.  Date:  12/08/2020   Location of Patient: Home Location of Provider:  Office Consent was obtain for visit to be over via telehealth. I verified that I am speaking with the correct person using two identifiers.  I connected with  Matthew Molina on 12/08/20 via telephone and verified that I am speaking with the correct person using two identifiers.   I discussed the limitations of evaluation and management by telemedicine. The patient expressed understanding and agreed to proceed.   Time spent: 11 minutes   Noreene Larsson, NP

## 2020-12-08 NOTE — Assessment & Plan Note (Signed)
-  has been symptomatic x 3 days -has SOB despite using is duonebs and symbicort -Rx. Prednisone -Rx. augmentin

## 2020-12-09 ENCOUNTER — Other Ambulatory Visit: Payer: Medicare Other

## 2020-12-21 NOTE — Progress Notes (Signed)
Matthew Molina - 70 y.o. male MRN [id number]  Date of birth: 1951-01-13  Office Visit Note: Visit Date: 11/03/2020 PCP: Fayrene Helper, MD Referred by: Fayrene Helper, MD  Subjective: Chief Complaint  Patient presents with  . Lower Back - Pain   HPI:  Matthew Molina is a 70 y.o. male who comes in today at the request of Dr. Sanjuana Kava for planned Bilateral L4-L5 Lumbar epidural steroid injection with fluoroscopic guidance.  The patient has failed conservative care including home exercise, medications, time and activity modification.  This injection will be diagnostic and hopefully therapeutic.  Please see requesting physician notes for further details and justification.  MRI reviewed with images and spine model.  MRI reviewed in the note below.   ROS Otherwise per HPI.  Assessment & Plan: Visit Diagnoses:    ICD-10-CM   1. Lumbar radiculopathy  M54.16 XR C-ARM NO REPORT    Epidural Steroid injection    methylPREDNISolone acetate (DEPO-MEDROL) injection 80 mg  2. Spinal stenosis of lumbar region with neurogenic claudication  M48.062     Plan: No additional findings.   Meds & Orders:  Meds ordered this encounter  Medications  . methylPREDNISolone acetate (DEPO-MEDROL) injection 80 mg    Orders Placed This Encounter  Procedures  . XR C-ARM NO REPORT  . Epidural Steroid injection    Follow-up: Return if symptoms worsen or fail to improve.  Consider repeat procedure if more than 50% relief but not adequate functional relief.  Procedures: No procedures performed  Lumbosacral Transforaminal Epidural Steroid Injection - Sub-Pedicular Approach with Fluoroscopic Guidance  Patient: Matthew Molina      Date of Birth: 30-Jun-1951 MRN: [id number] PCP: Fayrene Helper, MD      Visit Date: 11/03/2020   Universal Protocol:    Date/Time: 11/03/2020  Consent Given By: the patient  Position: PRONE  Additional Comments: Vital signs were monitored before and after  the procedure. Patient was prepped and draped in the usual sterile fashion. The correct patient, procedure, and site was verified.   Injection Procedure Details:   Procedure diagnoses: Lumbar radiculopathy [M54.16]    Meds Administered:  Meds ordered this encounter  Medications  . methylPREDNISolone acetate (DEPO-MEDROL) injection 80 mg    Laterality: Bilateral  Location/Site:  L4-L5  Needle:5.0 in., 22 ga.  Short bevel or Quincke spinal needle  Needle Placement: Transforaminal  Findings:    -Comments: Excellent flow of contrast along the nerve, nerve root and into the epidural space.  Procedure Details: After squaring off the end-plates to get a true AP view, the C-arm was positioned so that an oblique view of the foramen as noted above was visualized. The target area is just inferior to the "nose of the scotty dog" or sub pedicular. The soft tissues overlying this structure were infiltrated with 2-3 ml. of 1% Lidocaine without Epinephrine.  The spinal needle was inserted toward the target using a "trajectory" view along the fluoroscope beam.  Under AP and lateral visualization, the needle was advanced so it did not puncture dura and was located close the 6 O'Clock position of the pedical in AP tracterory. Biplanar projections were used to confirm position. Aspiration was confirmed to be negative for CSF and/or blood. A 1-2 ml. volume of Isovue-250 was injected and flow of contrast was noted at each level. Radiographs were obtained for documentation purposes.   After attaining the desired flow of contrast documented above, a 0.5 to 1.0 ml test dose of  0.25% Marcaine was injected into each respective transforaminal space.  The patient was observed for 90 seconds post injection.  After no sensory deficits were reported, and normal lower extremity motor function was noted,   the above injectate was administered so that equal amounts of the injectate were placed at each foramen (level)  into the transforaminal epidural space.   Additional Comments:  The patient tolerated the procedure well Dressing: 2 x 2 sterile gauze and Band-Aid    Post-procedure details: Patient was observed during the procedure. Post-procedure instructions were reviewed.  Patient left the clinic in stable condition.      Clinical History: MRI LUMBAR SPINE WITHOUT CONTRAST  TECHNIQUE: Multiplanar, multisequence MR imaging of the lumbar spine was performed. No intravenous contrast was administered.  COMPARISON:  Lumbar radiograph 05/28/2020  FINDINGS: Segmentation:  Normal  Alignment:  Mild retrolisthesis L3-4.  5 mm anterolisthesis L4-5.  Vertebrae: Normal bone marrow. Negative for fracture or mass. Hemangioma L1 vertebral body which is lipid poor.  Conus medullaris and cauda equina: Conus extends to the T12-L1 level. Conus and cauda equina appear normal.  Paraspinal and other soft tissues: Negative for paraspinous mass or adenopathy.  Disc levels:  L1-2: Mild disc bulging and mild facet degeneration. Negative for disc protrusion or stenosis.  L2-3: Mild disc bulging and mild facet degeneration. Mild subarticular stenosis bilaterally.  L3-4: Diffuse disc bulging with endplate spurring. Bilateral facet degeneration. Moderate subarticular and foraminal stenosis bilaterally. Spinal canal adequate in size.  L4-5: 5 mm anterolisthesis with severe facet degeneration and bilateral facet joint effusions. Bilateral ligamentum flavum hypertrophy. Diffuse disc bulging. Moderate to severe spinal stenosis. Moderate to severe subarticular and foraminal stenosis bilaterally.  L5-S1: Disc bulging and moderate endplate spurring. Bilateral facet degeneration. Moderate subarticular stenosis bilaterally due to spurring.  IMPRESSION: Moderate subarticular and foraminal stenosis bilaterally L3-4  5 mm anterolisthesis L5-S1 with moderate to severe spinal stenosis and  moderate to severe subarticular and foraminal stenosis bilaterally  Moderate subarticular stenosis bilaterally L5-S1 due to spurring.   Electronically Signed   By: Marlan Palau M.D.   On: 09/14/2020 14:58     Objective:  VS:  HT:    WT:   BMI:     BP:(!) 144/93  HR:(!) 112bpm  TEMP: ( )  RESP:  Physical Exam Vitals and nursing note reviewed.  Constitutional:      General: He is not in acute distress.    Appearance: Normal appearance. He is obese. He is not ill-appearing.  HENT:     Head: Normocephalic and atraumatic.     Right Ear: External ear normal.     Left Ear: External ear normal.     Nose: No congestion.  Eyes:     Extraocular Movements: Extraocular movements intact.  Cardiovascular:     Rate and Rhythm: Normal rate.     Pulses: Normal pulses.  Pulmonary:     Effort: Pulmonary effort is normal. No respiratory distress.  Abdominal:     General: There is no distension.     Palpations: Abdomen is soft.  Musculoskeletal:        General: No tenderness or signs of injury.     Cervical back: Neck supple.     Right lower leg: No edema.     Left lower leg: No edema.     Comments: Patient has good distal strength without clonus.  Skin:    Findings: No erythema or rash.  Neurological:     General: No focal deficit present.  Mental Status: He is alert and oriented to person, place, and time.     Sensory: No sensory deficit.     Motor: No weakness or abnormal muscle tone.     Coordination: Coordination normal.  Psychiatric:        Mood and Affect: Mood normal.        Behavior: Behavior normal.      Imaging: No results found.

## 2020-12-21 NOTE — Procedures (Signed)
Lumbosacral Transforaminal Epidural Steroid Injection - Sub-Pedicular Approach with Fluoroscopic Guidance  Patient: Matthew Molina      Date of Birth: [birth year] MRN: [id number] PCP: Kerri Perches, MD      Visit Date: 11/03/2020   Universal Protocol:    Date/Time: 11/03/2020  Consent Given By: the patient  Position: PRONE  Additional Comments: Vital signs were monitored before and after the procedure. Patient was prepped and draped in the usual sterile fashion. The correct patient, procedure, and site was verified.   Injection Procedure Details:   Procedure diagnoses: Lumbar radiculopathy [M54.16]    Meds Administered:  Meds ordered this encounter  Medications  . methylPREDNISolone acetate (DEPO-MEDROL) injection 80 mg    Laterality: Bilateral  Location/Site:  L4-L5  Needle:5.0 in., 22 ga.  Short bevel or Quincke spinal needle  Needle Placement: Transforaminal  Findings:    -Comments: Excellent flow of contrast along the nerve, nerve root and into the epidural space.  Procedure Details: After squaring off the end-plates to get a true AP view, the C-arm was positioned so that an oblique view of the foramen as noted above was visualized. The target area is just inferior to the "nose of the scotty dog" or sub pedicular. The soft tissues overlying this structure were infiltrated with 2-3 ml. of 1% Lidocaine without Epinephrine.  The spinal needle was inserted toward the target using a "trajectory" view along the fluoroscope beam.  Under AP and lateral visualization, the needle was advanced so it did not puncture dura and was located close the 6 O'Clock position of the pedical in AP tracterory. Biplanar projections were used to confirm position. Aspiration was confirmed to be negative for CSF and/or blood. A 1-2 ml. volume of Isovue-250 was injected and flow of contrast was noted at each level. Radiographs were obtained for documentation purposes.   After attaining the  desired flow of contrast documented above, a 0.5 to 1.0 ml test dose of 0.25% Marcaine was injected into each respective transforaminal space.  The patient was observed for 90 seconds post injection.  After no sensory deficits were reported, and normal lower extremity motor function was noted,   the above injectate was administered so that equal amounts of the injectate were placed at each foramen (level) into the transforaminal epidural space.   Additional Comments:  The patient tolerated the procedure well Dressing: 2 x 2 sterile gauze and Band-Aid    Post-procedure details: Patient was observed during the procedure. Post-procedure instructions were reviewed.  Patient left the clinic in stable condition.

## 2020-12-29 ENCOUNTER — Ambulatory Visit (INDEPENDENT_AMBULATORY_CARE_PROVIDER_SITE_OTHER): Payer: 59 | Admitting: Nurse Practitioner

## 2020-12-29 ENCOUNTER — Other Ambulatory Visit: Payer: Self-pay

## 2020-12-29 ENCOUNTER — Encounter: Payer: Self-pay | Admitting: Nurse Practitioner

## 2020-12-29 VITALS — BP 152/87 | HR 111 | Temp 97.1°F | Ht 73.0 in | Wt 248.6 lb

## 2020-12-29 DIAGNOSIS — R1084 Generalized abdominal pain: Secondary | ICD-10-CM | POA: Diagnosis not present

## 2020-12-29 DIAGNOSIS — K219 Gastro-esophageal reflux disease without esophagitis: Secondary | ICD-10-CM | POA: Diagnosis not present

## 2020-12-29 DIAGNOSIS — K5732 Diverticulitis of large intestine without perforation or abscess without bleeding: Secondary | ICD-10-CM

## 2020-12-29 NOTE — Progress Notes (Signed)
Cc'ed to pcp °

## 2020-12-29 NOTE — Progress Notes (Signed)
Referring Provider: Fayrene Helper, MD Primary Care Physician:  Fayrene Helper, MD Primary GI:  Dr. Abbey Chatters  Chief Complaint  Patient presents with  . Gastroesophageal Reflux  . Abdominal Pain    Occ, mid abd    HPI:   Matthew Molina is a 70 y.o. male who presents for follow-up on GERD and abdominal pain.  The patient was last seen in our office 06/23/2020 for the same as well as rectal bleeding.  Hospital admission 2021 for CKD and acute blood loss anemia likely due to lower GI bleeding and possible ischemic colitis.  CT of the abdomen and pelvis during admission found diffuse diverticulosis with asymmetric area of focal inflammatory change in the ascending colon.  Hemoglobin stabilized and he was discharged on 7 days of Augmentin and PPI.  Follow-up ER presentation for abdominal pain and diarrhea-C. difficile negative and CT documented resolution of inflammatory process but some jejunal inflammation noted and recommended restart Augmentin.  Follow-up labs and April 2021 demonstrated improved hemoglobin of 12.6 and normalized ferritin.  Colonoscopy completed 04/01/2020 which found 7 total colon polyps, diverticulosis in the entire colon, external and internal hemorrhoids.  Surgical pathology found the polyps to be tubular adenoma.  Recommended repeat colonoscopy in 3 years (2024).  At his last visit GERD well managed on Protonix 40 mg daily, abdominal pain resolved, no longer on iron.  He did note an episode of chest pain that lasted 20 minutes at rest which is self resolved but had not discussed with his primary care.  No other overt GI complaints.  Recommended continue PPI, over-the-counter meds as needed for GERD breakthrough, monitor for recurrent abdominal pain, discussed episode of chest pain with primary care for further recommendations, notify us of any recurrent bleeding.  Patient called our office 07/05/2020 indicating he not have a bowel movement in a week.  Has taken  "laxatives +2 stool softeners" without improvement.  He did eventually have a bowel movement and was feeling better, recommend he notify us of any recurrence.  That he states is doing okay overall. He has good days and bad days. Some days he just "feels bad" with aches, pains, "feeling down." Denies suicidal/homicidal ideations. Still on Protonix 40 mg daily which controls his GERD symptoms well. Has occasional stomach pains (mild, every other day, brief and self resolving) mid- to lower abdomen. Has bowel movement every 1-3 days, consistent with [place] 4. Will have "vomiting" if he eats late at night. Denies hematochezia, melena, fever, chills, unintentional weight loss. Denies URI or flu-like symptoms. Denies loss of sense of taste or smell. The patient has received COVID-19 vaccination(s). He has had a flu vaccine. Denies chest pain, dyspnea, dizziness, lightheadedness, syncope, near syncope. Denies any other upper or lower GI symptoms.  He notes when he has his next colonoscopy he wants the "big jug" of prep (Go-[employer]ytely or the like)  Past Medical History:  Diagnosis Date  . Acute blood loss anemia 01/05/2020  . Acute kidney injury ([place])   . Acute respiratory failure with hypoxia ([place])   . Arthritis   . COPD (chronic obstructive pulmonary disease) ([place])   . Depression   . Diabetes mellitus   . Diabetes mellitus without complication ([place])   . Diverticulitis   . Elevated PSA 10/01/2016  . Encounter for support and coordination of transition of care 01/14/2020  . Hypercholesterolemia   . Hyperlipidemia   . Hypertension   . Insomnia 01/05/2016  . Ischemic colitis ([place]) 01/05/2020  .  Multiple lung nodules on CT 04/02/2015  . Nicotine addiction   . Obesity   . Oxygen deficiency    qhs  . Schizophrenia [facility])     Past Surgical History:  Procedure Laterality Date  . CATARACT EXTRACTION W/PHACO Left 11/20/2013   Procedure: CATARACT EXTRACTION PHACO AND INTRAOCULAR LENS PLACEMENT (IOC);   Surgeon: Tonny Branch, MD;  Location: [facility] ORS;  Service: Ophthalmology;  Laterality: Left;  CDE:10.26  . CATARACT EXTRACTION W/PHACO Right 12/08/2013   Procedure: RIGHT EYE CATARACT EXTRACTION PHACO AND INTRAOCULAR LENS PLACEMENT ;  Surgeon: Tonny Branch, MD;  Location: [facility] ORS;  Service: Ophthalmology;  Laterality: Right;  CDE 12.38  . COLONOSCOPY N/A 04/01/2013   pancolonic diverticulosis, redundant colon, large internal hemorrhoids.   . COLONOSCOPY  2014   INCOMPLETE PREP IN R COLON  . COLONOSCOPY N/A 04/01/2020   Procedure: COLONOSCOPY;  Surgeon: Danie Binder, MD;  Location: [facility] END[facility] SUITE;  Service: Endoscopy;  Laterality: N/A;  8:45am  . ESOPHAGOGASTRODUODENOSCOPY N/A 02/18/2016   stricture at GE junction, moderate erosive gastritis and mild non-erosive duodenitis. +H.pylori. Treated initially with Prevpac. Breath test to check for eradication was positive, and he was prescribed Pylera.   Marland Kitchen EYE SURGERY Left 11/2013   cataract extraction  . POLYPECTOMY  04/01/2020   Procedure: POLYPECTOMY;  Surgeon: Danie Binder, MD;  Location: [facility];  Service: Endoscopy;;  . SAVORY DILATION N/A 02/18/2016   Procedure: SAVORY DILATION;  Surgeon: Danie Binder, MD;  Location: [facility] END[facility] SU[facility];  Service: Endoscopy;  Laterality: N/A;  . SHOULDER SURGERY      Current Outpatient Medications  Medication Sig Dispense Refill  . ACCU-CHEK GUIDE test strip USE AS DIRECTED TO CHECK BLOOD SUGAR ONCE DAILY. 50 strip 0  . Accu-Chek Softclix Lancets lancets TEST BLOOD SUGAR ONCE DAILY AS DIRECTED. 100 each 5  . acetaminophen (TYLENOL) 500 MG tablet Take 500-1,000 mg by mouth daily as needed for headache. Takes mostly for HA about 2 x/week.    Marland Kitchen albuterol (VENTOLIN HFA) 108 (90 Base) MCG/ACT inhaler INHALE 2 PUFFS INTO THE LUNGS EVERY 6 HOURS AS NEEDED FOR WHEEZING OR SHORTNESS OF BREATH. 8.5 g 0  . blood glucose meter kit and supplies Dispense based on patient and insurance preference.Use to check blood sugar once  daily (FOR ICD-10 E10.9, E11.9). 1 each 0  . Blood Glucose Monitoring Suppl (ACCU-CHEK AVIVA PLUS) w/Device KIT accucheck aviva meter Once daily testing DX e11.9 1 kit 0  . budesonide-formoterol (SYMBICORT) 160-4.5 MCG/ACT inhaler Inhale 2 puffs into the lungs 2 (two) times daily. 1 each 3  . busPIRone (BUSPAR) 7.5 MG tablet Take one tablet by mouth once daily, for anxiety 90 tablet 1  . docusate sodium (COLACE) 100 MG capsule Take 100 mg by mouth daily as needed for mild constipation.     Marland Kitchen ezetimibe (ZETIA) 10 MG tablet Take 1 tablet (10 mg total) by mouth daily. 90 tablet 1  . glipiZIDE (GLUCOTROL XL) 10 MG 24 hr tablet TAKE 1 TABLEY BY MOUTH ONCE DAILY. 90 tablet 1  . ipratropium-albuterol (DUONEB) 0.5-2.5 (3) MG/3ML SOLN Take by nebulization every 4 (four) hours as needed.    . meloxicam (MOBIC) 7.5 MG tablet TAKE 1 TABLET BY MOUTH ONCE DAILY. 30 tablet 0  . olmesartan (BENICAR) 40 MG tablet Take 1 tablet (40 mg total) by mouth daily. 90 tablet 0  . pantoprazole (PROTONIX) 40 MG tablet TAKE ONE TABLET BY MOUTHONCE DAILY. 30 tablet 11  . pravastatin (PRAVACHOL) 20 MG  tablet TAKE 1 TABLET BY MOUTH ONCE DAILY AT BEDTIME FOR CHOLESTEROL. 90 tablet 0  . risperiDONE (RISPERDAL) 1 MG tablet TAKE (1) TABLET BY MOUTH AT BEDTIME WITH 4MG. 90 tablet 1  . risperidone (RISPERDAL) 4 MG tablet TAKE ONE TABLET BY MOUTH IN THE EVENING. TAKE WITH 1MG TABLET. 90 tablet 0  . rosuvastatin (CRESTOR) 20 MG tablet Take 1 tablet (20 mg total) by mouth daily. 90 tablet 3  . tamsulosin (FLOMAX) 0.4 MG CAPS capsule Take 1 capsule (0.4 mg total) by mouth daily after supper. 90 capsule 3  . traZODone (DESYREL) 50 MG tablet Take 1 tablet (50 mg total) by mouth at bedtime as needed. for sleep 90 tablet 0  . UNABLE TO FIND Nebulizer mouthpiece and tubing x 1  DX COPD 1 each 0  . UNABLE TO FIND XL pullups for daily use DX Incontinence 100 each 11  . UNABLE TO FIND Nebulizer machine Mask and tubing x 1  DX J44.9 1 each 0    No current facility-administered medications for this visit.    Allergies as of 12/29/2020 - Review Complete 12/29/2020  Allergen Reaction Noted  . Sertraline hcl  04/17/2011  . Wellbutrin [bupropion] Other (See Comments) 07/29/2015    Family History  Problem Relation Age of Onset  . Diabetes Mother   . Hypertension Mother   . Stroke Mother   . Diabetes Sister   . Heart disease Sister   . Kidney disease Father   . Drug abuse Brother   . Colon cancer Neg Hx   . Colon polyps Neg Hx     Social History   Socioeconomic History  . Marital status: Divorced    Spouse name: Not on file  . Number of children: 2  . Years of education: Not on file  . Highest education level: Not on file  Occupational History  . Occupation: retired from [employer]  . Smoking status: Current Every Day Smoker    Packs/day: 1.00    Years: 48.00    Pack years: 48.00    Types: Cigarettes    Last attempt to quit: 05/02/2017    Years since quitting: 3.6  . Smokeless tobacco: Never Used  Vaping Use  . Vaping Use: Never used  Substance and Sexual Activity  . Alcohol use: Yes    Alcohol/week: 0.0 standard drinks    Comment: "little bit of beer"  . Drug use: No  . Sexual activity: Not Currently    Birth control/protection: None  Other Topics Concern  . Not on file  Social History Narrative   ** Merged History Encounter **       ** Merged History Encounter **       Social Determinants of Health   Financial Resource Strain: Low Risk   . Difficulty of Paying Living Expenses: Not very hard  Food Insecurity: No Food Insecurity  . Worried About Charity fundraiser in the Last Year: Never true  . Ran Out of Food in the Last Year: Never true  Transportation Needs: No Transportation Needs  . Lack of Transportation (Medical): No  . Lack of Transportation (Non-Medical): No  Physical Activity: Insufficiently Active  . Days of Exercise per Week: 2 days  . Minutes of Exercise per Session:  10 min  Stress: No Stress Concern Present  . Feeling of Stress : Not at all  Social Connections: Socially Isolated  . Frequency of Communication with Friends and Family: More than three times a  week  . Frequency of Social Gatherings with Friends and Family: Three times a week  . Attends Religious Services: Never  . Active Member of Clubs or Organizations: No  . Attends Archivist Meetings: Never  . Marital Status: Divorced    Subjective: Review of Systems  Constitutional: Negative for chills, fever, malaise/fatigue and weight loss.  HENT: Negative for congestion and sore throat.   Respiratory: Negative for cough and shortness of breath.   Cardiovascular: Negative for chest pain and palpitations.  Gastrointestinal: Positive for abdominal pain. Negative for blood in stool, constipation, diarrhea, heartburn, melena, nausea and vomiting.  Musculoskeletal: Negative for joint pain and myalgias.  Skin: Negative for rash.  Neurological: Negative for dizziness and weakness.  Endo/Heme/Allergies: Does not bruise/bleed easily.  Psychiatric/Behavioral: Negative for depression. The patient is not nervous/anxious.   All other systems reviewed and are negative.    Objective: BP (!) 152/87   Pulse (!) 111   Temp (!) 97.1 F (36.2 C) (Temporal)   Ht _0  (1.854 m)   Wt 248 lb 9.6 oz (112.8 kg)   BMI 32.80 kg/m  Physical Exam Vitals and nursing note reviewed.  Constitutional:      General: He is not in acute distress.    Appearance: Normal appearance. He is well-developed. He is obese. He is not ill-appearing, toxic-appearing or diaphoretic.  HENT:     Head: Normocephalic and atraumatic.     Nose: No congestion or rhinorrhea.  Eyes:     General: No scleral icterus. Cardiovascular:     Rate and Rhythm: Normal rate and regular rhythm.     Heart sounds: Normal heart sounds.  Pulmonary:     Effort: Pulmonary effort is normal.     Breath sounds: Normal breath sounds.   Abdominal:     General: Bowel sounds are normal. There is no distension.     Palpations: Abdomen is soft. There is no hepatomegaly, splenomegaly or mass.     Tenderness: There is abdominal tenderness in the right lower quadrant. There is no guarding or rebound.     Hernia: No hernia is present.  Musculoskeletal:     Cervical back: Neck supple.  Skin:    General: Skin is warm and dry.     Coloration: Skin is not jaundiced.     Findings: No bruising or rash.  Neurological:     General: No focal deficit present.     Mental Status: He is alert and oriented to person, place, and time. Mental status is at baseline.  Psychiatric:        Mood and Affect: Mood normal.        Behavior: Behavior normal.        Thought Content: Thought content normal.      Assessment:  Very pleasant 70 year old male presents for follow-up on GERD and abdominal pain.  Overall he is doing well, no red flag/warning signs or symptoms.  GERD: Currently well managed on PPI.  Recommend he continue his current medications and notify us of any worsening.  He does have occasional/rare vomiting, only if he eats late at night.  Recommended he avoid eating prior to bed.  Abdominal pain: The patient has ongoing, mild, brief chronic abdominal discomfort in his mid abdomen.  He did have some area of worsening abdominal discomfort in his right lower quadrant on exam.  He does have a history of diverticulitis, although he does not present as typical for this at this point.  I recommend he  continue to monitor for any worsening abdominal pain and at that point we could consider CT of the abdomen to evaluate.   Plan: 1. Continue current medications 2. Call if any worsening symptoms 3. Follow-up in 6 months    Thank you for allowing [place] to participate in the care of Matthew Oman, DNP, AGNP-C Adult & Gerontological Nurse Practitioner [facility]   12/29/2020 9:59 AM   Disclaimer:  This note was dictated with voice recognition software. Similar sounding words can inadvertently be transcribed and may not be corrected upon review.

## 2020-12-29 NOTE — Patient Instructions (Signed)
Your health issues we discussed today were:   GERD (reflux/heartburn): 1. Glad your symptoms are doing well estimation point 2. Continue take your Protonix 40 mg once a day 3. Call us for any worsening or severe symptoms  Abdominal pain: 1. I feel this is likely chronic in nature as it has been ongoing for a while 2. If you have any worsening abdominal pain, especially in that right lower part, notify us so we can discuss other testing 3. Call us if you have any worsening symptoms otherwise  Overall I recommend:  1. Continue other current medications 2. Return for follow-up in 6 months 3. Call for any questions or concerns   ---------------------------------------------------------------  I am glad you have gotten your COVID-19 vaccination!  Even though you are fully vaccinated you should continue to follow CDC and state/local guidelines.  ---------------------------------------------------------------   At [facility] we value your feedback. You may receive a survey about your visit today. Please share your experience as we strive to create trusting relationships with our patients to provide genuine, compassionate, quality care.  We appreciate your understanding and patience as we review any laboratory studies, imaging, and other diagnostic tests that are ordered as we care for you. Our office policy is 5 business days for review of these results, and any emergent or urgent results are addressed in a timely manner for your best interest. If you do not hear from our office in 1 week, please contact us.   We also encourage the use of MyChart, which contains your medical information for your review as well. If you are not enrolled in this feature, an access code is on this after visit summary for your convenience. Thank you for allowing [place] to be involved in your care.  It was great to see you today!  I hope you have a great winter, stay warm!!

## 2020-12-30 ENCOUNTER — Other Ambulatory Visit: Payer: Self-pay | Admitting: Family Medicine

## 2021-01-03 ENCOUNTER — Encounter: Payer: Self-pay | Admitting: Family Medicine

## 2021-01-03 ENCOUNTER — Telehealth (INDEPENDENT_AMBULATORY_CARE_PROVIDER_SITE_OTHER): Payer: 59 | Admitting: Family Medicine

## 2021-01-03 ENCOUNTER — Other Ambulatory Visit: Payer: Self-pay

## 2021-01-03 DIAGNOSIS — E1169 Type 2 diabetes mellitus with other specified complication: Secondary | ICD-10-CM | POA: Diagnosis not present

## 2021-01-03 DIAGNOSIS — I10 Essential (primary) hypertension: Secondary | ICD-10-CM | POA: Diagnosis not present

## 2021-01-03 DIAGNOSIS — E785 Hyperlipidemia, unspecified: Secondary | ICD-10-CM | POA: Diagnosis not present

## 2021-01-03 DIAGNOSIS — F17210 Nicotine dependence, cigarettes, uncomplicated: Secondary | ICD-10-CM | POA: Diagnosis not present

## 2021-01-03 DIAGNOSIS — Z720 Tobacco use: Secondary | ICD-10-CM | POA: Diagnosis not present

## 2021-01-03 DIAGNOSIS — E669 Obesity, unspecified: Secondary | ICD-10-CM

## 2021-01-03 DIAGNOSIS — R2681 Unsteadiness on feet: Secondary | ICD-10-CM

## 2021-01-03 DIAGNOSIS — F209 Schizophrenia, unspecified: Secondary | ICD-10-CM

## 2021-01-03 MED ORDER — TRIAMTERENE-HCTZ 37.5-25 MG PO TABS: ORAL_TABLET | ORAL | 1 refills | Status: DC

## 2021-01-03 MED ORDER — TRAZODONE HCL 50 MG PO TABS
50.0000 mg | ORAL_TABLET | Freq: Every evening | ORAL | 0 refills | Status: DC | PRN
Start: 2021-01-03 — End: 2021-02-04

## 2021-01-03 NOTE — Assessment & Plan Note (Signed)
  Patient re-educated about  the importance of commitment to a  minimum of 150 minutes of exercise per week as able.  The importance of healthy food choices with portion control discussed, as well as eating regularly and within a 12 hour window most days. The need to choose "clean , green" food 50 to 75% of the time is discussed, as well as to make water the primary drink and set a goal of 64 ounces water daily.    Weight /BMI 12/29/2020 11/22/2020 10/14/2020  WEIGHT 248 lb 9.6 oz 246 lb 9.6 oz 240 lb  HEIGHT 6\' 1"  6\' 1"  6\' 1"   BMI 32.8 kg/m2 32.53 kg/m2 31.66 kg/m2  Some encounter information is confidential and restricted. Go to Review Flowsheets activity to see all data.

## 2021-01-03 NOTE — Patient Instructions (Signed)
Please get fasting lipid, cmp and EGFr and HBa1C this week if possible, your blood sugar is uncontrolled , based on what you report. Medication will be adjusted based on lab values   F/U to review blood sugar and blood pressure in office in 6 to 8 weeks, call if you need me sooner.  Please re consider and work on stopping smoking, you are now smoking 1 to 2 PPD which increases your risk of stroke, heart disease and many types of cancer  It is important that you exercise regularly at least 30 minutes 5 times a week. If you develop chest pain, have severe difficulty breathing, or feel very tired, stop exercising immediately and seek medical attention  Think about what you will eat, plan ahead. Choose " clean, green, fresh or frozen" over canned, processed or packaged foods which are more sugary, salty and fatty. 70 to 75% of food eaten should be vegetables and fruit. Three meals at set times with snacks allowed between meals, but they must be fruit or vegetables. Aim to eat over a 12 hour period , example 7 am to 7 pm, and STOP after  your last meal of the day. Drink water,generally about 64 ounces per day, no other drink is as healthy. Fruit juice is best enjoyed in a healthy way, by EATING the fruit. Thanks for choosing [facility], we consider it a privelige to serve you.

## 2021-01-03 NOTE — Assessment & Plan Note (Signed)
Stable , denies auditory or visual hallucinations currently

## 2021-01-03 NOTE — Assessment & Plan Note (Signed)
Asked:confirms currently smokes cigarettes 1 to 2 PPD Assess: Unwilling to set a quit date, but is cutting back Advise: needs to QUIT to reduce risk of cancer, cardio and cerebrovascular disease Assist: counseled for 5 minutes and literature provided Arrange: follow up in 2 to 4 months

## 2021-01-03 NOTE — Progress Notes (Signed)
Virtual Visit via Telephone Note  I connected with Matthew Molina on 01/03/21 at 10:20 AM EST by telephone and verified that I am speaking with the correct person using two identifiers.  Location: Patient: home Provider: office   I discussed the limitations, risks, security and privacy concerns of performing an evaluation and management service by telephone and the availability of in person appointments. I also discussed with the patient that there may be a patient responsible charge related to this service. The patient expressed understanding and agreed to proceed.   History of Present Illness: States doing OK, states he is getting blood sugar over 200 for the past week, states before that was 163, denies additional sweets Denies recent fever or chills. Denies sinus pressure, nasal congestion, ear pain or sore throat. Denies chest congestion, productive cough or wheezing. Denies chest pains, palpitations and leg swelling Denies abdominal pain, nausea, vomiting,diarrhea or constipation.   Denies dysuria, frequency, hesitancy or incontinence. Denies uncontrolled  joint pain, swelling and limitation in mobility. Denies headaches, seizures, numbness, or tingling. Denies depression, anxiety or insomnia. Denies skin break down or rash.       Observations/Objective: There were no vitals taken for this visit. Good communication with no confusion and intact memory. Alert and oriented x 3 No signs of respiratory distress during speech    Assessment and Plan: Hypertension DASH diet and commitment to daily physical activity for a minimum of 30 minutes discussed and encouraged, as a part of hypertension management. The importance of attaining a healthy weight is also discussed.  BP/Weight 12/29/2020 11/22/2020 11/03/2020 10/14/2020 10/11/2020 09/16/2020 3/57/0177  Systolic BP 939 030 092 330 076 - 226  Diastolic BP 87 80 93 98 78 - 85  Wt. (Lbs) 248.6 246.6 - 240 239 239 239.12  BMI  32.8 32.53 - 31.66 32.41 31.53 31.55  Some encounter information is confidential and restricted. Go to Review Flowsheets activity to see all data.       Tobacco abuse Asked:confirms currently smokes cigarettes 1 to 2 PPD Assess: Unwilling to set a quit date, but is cutting back Advise: needs to QUIT to reduce risk of cancer, cardio and cerebrovascular disease Assist: counseled for 5 minutes and literature provided Arrange: follow up in 2 to 4 months   Schizophrenia, unspecified type ([place]) Stable , denies auditory or visual hallucinations currently  Hyperlipidemia Hyperlipidemia:Low fat diet discussed and encouraged.   Lipid Panel  Lab Results  Component Value Date   CHOL 183 05/26/2020   HDL 34 (L) 05/26/2020   LDLCALC 104 (H) 05/26/2020   TRIG 341 (H) 05/26/2020   CHOLHDL 5.4 (H) 05/26/2020  uncontrolled Updated lab needed at/ before next visit.      Diabetes mellitus type 2 in obese [facility]) Matthew Molina is reminded of the importance of commitment to daily physical activity for 30 minutes or more, as able and the need to limit carbohydrate intake to 30 to 60 grams per meal to help with blood sugar control.   The need to take medication as prescribed, test blood sugar as directed, and to call between visits if there is a concern that blood sugar is uncontrolled is also discussed.   Matthew Molina is reminded of the importance of daily foot exam, annual eye examination, and good blood sugar, blood pressure and cholesterol control. Reports poor control x 1 week Updated lab needed at/ before next visit.   Diabetic Labs Latest Ref Rng & Units 09/13/2020 05/27/2020 05/26/2020 05/26/2020 03/10/2020  HbA1c 4.0 -  5.6 % 6.5(A) 7.8(A) - - -  Microalbumin Not Estab. ug/mL - - 36.7(H) 1.8 -  Micro/Creat Ratio 0.0 - 30.0 mg/g creat - - - - -  Chol <200 mg/dL - - - 183 -  HDL > OR = 40 mg/dL - - - 34(L) -  Calc LDL mg/dL (calc) - - - 104(H) -  Triglycerides <150 mg/dL - - - 341(H) -   Creatinine 0.70 - 1.18 mg/dL - - - 1.38(H) 1.91(H)   BP/Weight 12/29/2020 11/22/2020 11/03/2020 10/14/2020 10/11/2020 09/16/2020 4/74/2595  Systolic BP 638 756 433 295 188 - 416  Diastolic BP 87 80 93 98 78 - 85  Wt. (Lbs) 248.6 246.6 - 240 239 239 239.12  BMI 32.8 32.53 - 31.66 32.41 31.53 31.55  Some encounter information is confidential and restricted. Go to Review Flowsheets activity to see all data.   Foot/eye exam completion dates Latest Ref Rng & Units 10/08/2019 10/15/2018  Eye Exam No Retinopathy - No Retinopathy  Foot Form Completion - Done -        Obesity (BMI 30.0-34.9)  Patient re-educated about  the importance of commitment to a  minimum of 150 minutes of exercise per week as able.  The importance of healthy food choices with portion control discussed, as well as eating regularly and within a 12 hour window most days. The need to choose "clean , green" food 50 to 75% of the time is discussed, as well as to make water the primary drink and set a goal of 64 ounces water daily.    Weight /BMI 12/29/2020 11/22/2020 10/14/2020  WEIGHT 248 lb 9.6 oz 246 lb 9.6 oz 240 lb  HEIGHT 6\' 1"  6\' 1"  6\' 1"   BMI 32.8 kg/m2 32.53 kg/m2 31.66 kg/m2  Some encounter information is confidential and restricted. Go to Review Flowsheets activity to see all data.      Unsteady gait Home safety discussed, has had no falls since last visit    Follow Up Instructions:    I discussed the assessment and treatment plan with the patient. The patient was provided an opportunity to ask questions and all were answered. The patient agreed with the plan and demonstrated an understanding of the instructions.   The patient was advised to call back or seek an in-person evaluation if the symptoms worsen or if the condition fails to improve as anticipated.  I provided 22 minutes of non-face-to-face time during this encounter.   [name], MD

## 2021-01-03 NOTE — Assessment & Plan Note (Signed)
Matthew Molina is reminded of the importance of commitment to daily physical activity for 30 minutes or more, as able and the need to limit carbohydrate intake to 30 to 60 grams per meal to help with blood sugar control.   The need to take medication as prescribed, test blood sugar as directed, and to call between visits if there is a concern that blood sugar is uncontrolled is also discussed.   Matthew Molina is reminded of the importance of daily foot exam, annual eye examination, and good blood sugar, blood pressure and cholesterol control. Reports poor control x 1 week Updated lab needed at/ before next visit.   Diabetic Labs Latest Ref Rng & Units 09/13/2020 05/27/2020 05/26/2020 05/26/2020 03/10/2020  HbA1c 4.0 - 5.6 % 6.5(A) 7.8(A) - - -  Microalbumin Not Estab. ug/mL - - 36.7(H) 1.8 -  Micro/Creat Ratio 0.0 - 30.0 mg/g creat - - - - -  Chol <200 mg/dL - - - 183 -  HDL > OR = 40 mg/dL - - - 34(L) -  Calc LDL mg/dL (calc) - - - 104(H) -  Triglycerides <150 mg/dL - - - 341(H) -  Creatinine 0.70 - 1.18 mg/dL - - - 1.38(H) 1.91(H)   BP/Weight 12/29/2020 11/22/2020 11/03/2020 10/14/2020 10/11/2020 09/16/2020 1/44/8185  Systolic BP 631 497 026 378 588 - 502  Diastolic BP 87 80 93 98 78 - 85  Wt. (Lbs) 248.6 246.6 - 240 239 239 239.12  BMI 32.8 32.53 - 31.66 32.41 31.53 31.55  Some encounter information is confidential and restricted. Go to Review Flowsheets activity to see all data.   Foot/eye exam completion dates Latest Ref Rng & Units 10/08/2019 10/15/2018  Eye Exam No Retinopathy - No Retinopathy  Foot Form Completion - Done -

## 2021-01-03 NOTE — Assessment & Plan Note (Signed)
Home safety discussed, has had no falls since last visit

## 2021-01-03 NOTE — Assessment & Plan Note (Signed)
DASH diet and commitment to daily physical activity for a minimum of 30 minutes discussed and encouraged, as a part of hypertension management. The importance of attaining a healthy weight is also discussed.  BP/Weight 12/29/2020 11/22/2020 11/03/2020 10/14/2020 10/11/2020 09/16/2020 6/94/8546  Systolic BP 270 350 093 818 299 - 371  Diastolic BP 87 80 93 98 78 - 85  Wt. (Lbs) 248.6 246.6 - 240 239 239 239.12  BMI 32.8 32.53 - 31.66 32.41 31.53 31.55  Some encounter information is confidential and restricted. Go to Review Flowsheets activity to see all data.

## 2021-01-03 NOTE — Assessment & Plan Note (Signed)
Hyperlipidemia:Low fat diet discussed and encouraged.   Lipid Panel  Lab Results  Component Value Date   CHOL 183 05/26/2020   HDL 34 (L) 05/26/2020   LDLCALC 104 (H) 05/26/2020   TRIG 341 (H) 05/26/2020   CHOLHDL 5.4 (H) 05/26/2020  uncontrolled Updated lab needed at/ before next visit.

## 2021-01-10 ENCOUNTER — Other Ambulatory Visit: Payer: Self-pay | Admitting: Family Medicine

## 2021-01-13 ENCOUNTER — Other Ambulatory Visit: Payer: Self-pay | Admitting: Family Medicine

## 2021-01-13 DIAGNOSIS — M25551 Pain in right hip: Secondary | ICD-10-CM

## 2021-01-19 ENCOUNTER — Encounter: Payer: Self-pay | Admitting: Podiatry

## 2021-01-19 ENCOUNTER — Other Ambulatory Visit: Payer: Self-pay

## 2021-01-19 ENCOUNTER — Ambulatory Visit (INDEPENDENT_AMBULATORY_CARE_PROVIDER_SITE_OTHER): Payer: Medicare Other | Admitting: Podiatry

## 2021-01-19 DIAGNOSIS — E1149 Type 2 diabetes mellitus with other diabetic neurological complication: Secondary | ICD-10-CM

## 2021-01-19 DIAGNOSIS — Q828 Other specified congenital malformations of skin: Secondary | ICD-10-CM

## 2021-01-19 DIAGNOSIS — E1140 Type 2 diabetes mellitus with diabetic neuropathy, unspecified: Secondary | ICD-10-CM | POA: Diagnosis not present

## 2021-01-19 DIAGNOSIS — B351 Tinea unguium: Secondary | ICD-10-CM | POA: Diagnosis not present

## 2021-01-19 DIAGNOSIS — M79676 Pain in unspecified toe(s): Secondary | ICD-10-CM | POA: Diagnosis not present

## 2021-01-19 NOTE — Progress Notes (Signed)
This patient returns to my office for at risk foot care.  This patient requires this care by a professional since this patient will be at risk due to having diabetes mellitus and chronic kidney disease.  Patient has pain in the two calluses on the bottom outside of his feet.  This patient is unable to cut nails himself since the patient cannot reach his nails.These nails are painful walking and wearing shoes. He cannot treat the painful calluses himself. . This patient presents for at risk foot care today.  General Appearance  Alert, conversant and in no acute stress.  Vascular  Dorsalis pedis and posterior tibial  pulses are palpable  bilaterally.  Capillary return is within normal limits  bilaterally. Temperature is within normal limits  bilaterally.  Neurologic  Senn-Weinstein monofilament wire test diminished   bilaterally. Muscle power within normal limits bilaterally.  Nails Thick disfigured discolored nails with subungual debris  from hallux to fifth toes bilaterally. No evidence of bacterial infection or drainage bilaterally.  Orthopedic  No limitations of motion  feet .  No crepitus or effusions noted.  No bony pathology or digital deformities noted.  Skin  normotropic skin  noted bilaterally.  No signs of infections or ulcers noted.   Porokeratosis sub 5th metabase  B/L.  Onychomycosis  Pain in right toes  Pain in left toes  Porokeratosis  X 2.  Consent was obtained for treatment procedures.   Mechanical debridement of nails 1-5  bilaterally performed with a nail nipper.  Filed with dremel without incident. Debridement of porokeratosis  with # 15 blade.     Return office visit   3 months                  Told patient to return for periodic foot care and evaluation due to potential at risk complications.   Donavyn Fecher DPM  

## 2021-01-25 DIAGNOSIS — E1169 Type 2 diabetes mellitus with other specified complication: Secondary | ICD-10-CM | POA: Diagnosis not present

## 2021-01-25 DIAGNOSIS — E785 Hyperlipidemia, unspecified: Secondary | ICD-10-CM | POA: Diagnosis not present

## 2021-01-26 ENCOUNTER — Other Ambulatory Visit: Payer: Self-pay

## 2021-01-26 DIAGNOSIS — R748 Abnormal levels of other serum enzymes: Secondary | ICD-10-CM

## 2021-01-26 LAB — HEMOGLOBIN A1C
Est. average glucose Bld gHb Est-mCnc: 163 mg/dL
Hgb A1c MFr Bld: 7.3 % — ABNORMAL HIGH (ref 4.8–5.6)

## 2021-01-26 LAB — CMP14+EGFR
ALT: 78 IU/L — ABNORMAL HIGH (ref 0–44)
AST: 56 IU/L — ABNORMAL HIGH (ref 0–40)
Albumin/Globulin Ratio: 1.6 (ref 1.2–2.2)
Albumin: 4.5 g/dL (ref 3.7–4.7)
Alkaline Phosphatase: 65 IU/L (ref 44–121)
BUN/Creatinine Ratio: 7 — ABNORMAL LOW (ref 10–24)
BUN: 9 mg/dL (ref 8–27)
Bilirubin Total: 0.5 mg/dL (ref 0.0–1.2)
CO2: 21 mmol/L (ref 20–29)
Calcium: 10.1 mg/dL (ref 8.6–10.2)
Chloride: 97 mmol/L (ref 96–106)
Creatinine, Ser: 1.37 mg/dL — ABNORMAL HIGH (ref 0.76–1.27)
GFR calc Af Amer: 60 mL/min/{1.73_m2} (ref >59)
GFR calc non Af Amer: 52 mL/min/{1.73_m2} — ABNORMAL LOW (ref >59)
Globulin, Total: 2.9 g/dL (ref 1.5–4.5)
Glucose: 144 mg/dL — ABNORMAL HIGH (ref 65–99)
Potassium: 3.9 mmol/L (ref 3.5–5.2)
Sodium: 137 mmol/L (ref 134–144)
Total Protein: 7.4 g/dL (ref 6.0–8.5)

## 2021-01-26 LAB — LIPID PANEL
Chol/HDL Ratio: 2.4 ratio (ref 0.0–5.0)
Cholesterol, Total: 94 mg/dL — ABNORMAL LOW (ref 100–199)
HDL: 39 mg/dL — ABNORMAL LOW (ref >39)
LDL Chol Calc (NIH): 28 mg/dL (ref 0–99)
Triglycerides: 163 mg/dL — ABNORMAL HIGH (ref 0–149)
VLDL Cholesterol Cal: 27 mg/dL (ref 5–40)

## 2021-02-01 DIAGNOSIS — J449 Chronic obstructive pulmonary disease, unspecified: Secondary | ICD-10-CM | POA: Diagnosis not present

## 2021-02-04 ENCOUNTER — Other Ambulatory Visit: Payer: Self-pay | Admitting: Family Medicine

## 2021-02-04 DIAGNOSIS — J449 Chronic obstructive pulmonary disease, unspecified: Secondary | ICD-10-CM

## 2021-02-06 DIAGNOSIS — J42 Unspecified chronic bronchitis: Secondary | ICD-10-CM | POA: Diagnosis not present

## 2021-02-06 DIAGNOSIS — J441 Chronic obstructive pulmonary disease with (acute) exacerbation: Secondary | ICD-10-CM | POA: Diagnosis not present

## 2021-02-06 DIAGNOSIS — J449 Chronic obstructive pulmonary disease, unspecified: Secondary | ICD-10-CM | POA: Diagnosis not present

## 2021-02-07 ENCOUNTER — Other Ambulatory Visit: Payer: Self-pay | Admitting: Family Medicine

## 2021-02-07 DIAGNOSIS — M25551 Pain in right hip: Secondary | ICD-10-CM

## 2021-02-23 ENCOUNTER — Encounter: Payer: Self-pay | Admitting: Nurse Practitioner

## 2021-02-23 ENCOUNTER — Ambulatory Visit: Payer: 59 | Admitting: Family Medicine

## 2021-02-23 ENCOUNTER — Other Ambulatory Visit: Payer: Self-pay

## 2021-02-23 ENCOUNTER — Ambulatory Visit (INDEPENDENT_AMBULATORY_CARE_PROVIDER_SITE_OTHER): Payer: Medicare Other | Admitting: Nurse Practitioner

## 2021-02-23 DIAGNOSIS — J42 Unspecified chronic bronchitis: Secondary | ICD-10-CM

## 2021-02-23 DIAGNOSIS — E669 Obesity, unspecified: Secondary | ICD-10-CM

## 2021-02-23 DIAGNOSIS — E1169 Type 2 diabetes mellitus with other specified complication: Secondary | ICD-10-CM | POA: Diagnosis not present

## 2021-02-23 DIAGNOSIS — M549 Dorsalgia, unspecified: Secondary | ICD-10-CM

## 2021-02-23 DIAGNOSIS — I10 Essential (primary) hypertension: Secondary | ICD-10-CM | POA: Diagnosis not present

## 2021-02-23 MED ORDER — KETOROLAC TROMETHAMINE 60 MG/2ML IM SOLN
30.0000 mg | Freq: Once | INTRAMUSCULAR | Status: AC
Start: 2021-02-23 — End: 2021-02-23
  Administered 2021-02-23: 30 mg via INTRAMUSCULAR

## 2021-02-23 NOTE — Assessment & Plan Note (Signed)
-  states blood sugars have been between 80s and 170s -no lows or extreme highs -no change in meds today

## 2021-02-23 NOTE — Addendum Note (Signed)
Addended by: Zacarias Pontes R on: 02/23/2021 09:23 AM   Modules accepted: Orders

## 2021-02-23 NOTE — Assessment & Plan Note (Signed)
-  doing well with symbicort and duonebs, uses albuterol PRN -no med changes today

## 2021-02-23 NOTE — Progress Notes (Signed)
Established Patient Office Visit  Subjective:  Patient ID: Matthew Molina, male    DOB: May 15, 1951  Age: 70 y.o. MRN: [id number]  CC:  Chief Complaint  Patient presents with  . COPD    Follow up   . Diabetes  . Hip Pain    BILATERAL HIP PAIN, BACK PAIN X 1 MONTH    HPI Matthew Molina presents for COPD checkup.  For COPD, he states he uses his nebulizers and feels that he is well controlled.  For diabetes, he states his blood sugar has been running around 174, and he states that is a little high for him. Sometimes his blood sugar is in the 80s.  He is having throbbing pain to his lower back that hs been chronic, but I got worse about a month ago. He does not recall a mechanism of injury. He states he has arthritis.  He states that he had an injection previously for his back and hip pain.  Past Medical History:  Diagnosis Date  . Acute blood loss anemia 01/05/2020  . Acute kidney injury ([place])   . Acute respiratory failure with hypoxia ([place])   . Arthritis   . COPD (chronic obstructive pulmonary disease) ([place])   . Depression   . Diabetes mellitus   . Diabetes mellitus without complication ([place])   . Diverticulitis   . Elevated PSA 10/01/2016  . Encounter for support and coordination of transition of care 01/14/2020  . Hypercholesterolemia   . Hyperlipidemia   . Hypertension   . Insomnia 01/05/2016  . Ischemic colitis ([place]) 01/05/2020  . Multiple lung nodules on CT 04/02/2015  . Nicotine addiction   . Obesity   . Oxygen deficiency    qhs  . Schizophrenia [facility])     Past Surgical History:  Procedure Laterality Date  . CATARACT EXTRACTION W/PHACO Left 11/20/2013   Procedure: CATARACT EXTRACTION PHACO AND INTRAOCULAR LENS PLACEMENT (IOC);  Surgeon: Tonny Branch, MD;  Location: [facility];  Service: Ophthalmology;  Laterality: Left;  CDE:10.26  . CATARACT EXTRACTION W/PHACO Right 12/08/2013   Procedure: RIGHT EYE CATARACT EXTRACTION PHACO AND INTRAOCULAR LENS PLACEMENT ;  Surgeon:  Tonny Branch, MD;  Location: [facility];  Service: Ophthalmology;  Laterality: Right;  CDE 12.38  . COLONOSCOPY N/A 04/01/2013   pancolonic diverticulosis, redundant colon, large internal hemorrhoids.   . COLONOSCOPY  2014   INCOMPLETE PREP IN R COLON  . COLONOSCOPY N/A 04/01/2020   Procedure: COLONOSCOPY;  Surgeon: Danie Binder, MD;  Location: [facility];  Service: Endoscopy;  Laterality: N/A;  8:45am  . ESOPHAGOGASTRODUODENOSCOPY N/A 02/18/2016   stricture at GE junction, moderate erosive gastritis and mild non-erosive duodenitis. +H.pylori. Treated initially with Prevpac. Breath test to check for eradication was positive, and he was prescribed Pylera.   Marland Kitchen EYE SURGERY Left 11/2013   cataract extraction  . POLYPECTOMY  04/01/2020   Procedure: POLYPECTOMY;  Surgeon: Danie Binder, MD;  Location: [facility];  Service: Endoscopy;;  . SAVORY DILATION N/A 02/18/2016   Procedure: SAVORY DILATION;  Surgeon: Danie Binder, MD;  Location: [facility];  Service: Endoscopy;  Laterality: N/A;  . SHOULDER SURGERY      Family History  Problem Relation Age of Onset  . Diabetes Mother   . Hypertension Mother   . Stroke Mother   . Diabetes Sister   . Heart disease Sister   . Kidney disease Father   . Drug abuse Brother   . Colon cancer Neg Hx   .  Colon polyps Neg Hx     Social History   Socioeconomic History  . Marital status: Divorced    Spouse name: Not on file  . Number of children: 2  . Years of education: Not on file  . Highest education level: Not on file  Occupational History  . Occupation: retired from [employer]  . Smoking status: Current Every Day Smoker    Packs/day: 1.00    Years: 48.00    Pack years: 48.00    Types: Cigarettes    Last attempt to quit: 05/02/2017    Years since quitting: 3.8  . Smokeless tobacco: Never Used  Vaping Use  . Vaping Use: Never used  Substance and Sexual Activity  . Alcohol use: Yes    Alcohol/week: 0.0 standard drinks     Comment: "little bit of beer"  . Drug use: No  . Sexual activity: Not Currently    Birth control/protection: None  Other Topics Concern  . Not on file  Social History Narrative   ** Merged History Encounter **       ** Merged History Encounter **       Social Determinants of Health   Financial Resource Strain: Low Risk   . Difficulty of Paying Living Expenses: Not very hard  Food Insecurity: No Food Insecurity  . Worried About Charity fundraiser in the Last Year: Never true  . Ran Out of Food in the Last Year: Never true  Transportation Needs: No Transportation Needs  . Lack of Transportation (Medical): No  . Lack of Transportation (Non-Medical): No  Physical Activity: Insufficiently Active  . Days of Exercise per Week: 2 days  . Minutes of Exercise per Session: 10 min  Stress: No Stress Concern Present  . Feeling of Stress : Not at all  Social Connections: Socially Isolated  . Frequency of Communication with Friends and Family: More than three times a week  . Frequency of Social Gatherings with Friends and Family: Three times a week  . Attends Religious Services: Never  . Active Member of Clubs or Organizations: No  . Attends Archivist Meetings: Never  . Marital Status: Divorced  Human resources officer Violence: Not on file    Outpatient Medications Prior to Visit  Medication Sig Dispense Refill  . ACCU-CHEK GUIDE test strip USE AS DIRECTED TO CHECK BLOOD SUGAR ONCE DAILY. 50 strip 0  . Accu-Chek Softclix Lancets lancets TEST BLOOD SUGAR ONCE DAILY AS DIRECTED. 100 each 5  . acetaminophen (TYLENOL) 500 MG tablet Take 500-1,000 mg by mouth daily as needed for headache. Takes mostly for HA about 2 x/week.    Marland Kitchen albuterol (VENTOLIN HFA) 108 (90 Base) MCG/ACT inhaler INHALE 2 PUFFS INTO THE LUNGS EVERY 6 HOURS AS NEEDED FOR WHEEZING OR SHORTNESS OF BREATH. 8.5 g 0  . blood glucose meter kit and supplies Dispense based on patient and insurance preference.Use to check blood  sugar once daily (FOR ICD-10 E10.9, E11.9). 1 each 0  . Blood Glucose Monitoring Suppl (ACCU-CHEK AVIVA PLUS) w/Device KIT accucheck aviva meter Once daily testing DX e11.9 1 kit 0  . budesonide-formoterol (SYMBICORT) 160-4.5 MCG/ACT inhaler Inhale 2 puffs into the lungs 2 (two) times daily. 1 each 3  . busPIRone (BUSPAR) 7.5 MG tablet TAKE 1 TABLET BY MOUTH ONCE DAILY. 90 tablet 0  . docusate sodium (COLACE) 100 MG capsule Take 100 mg by mouth daily as needed for mild constipation.     Marland Kitchen ezetimibe (ZETIA) 10  MG tablet Take 1 tablet (10 mg total) by mouth daily. 90 tablet 1  . glipiZIDE (GLUCOTROL XL) 10 MG 24 hr tablet TAKE 1 TABLEY BY MOUTH ONCE DAILY. 90 tablet 0  . ipratropium-albuterol (DUONEB) 0.5-2.5 (3) MG/3ML SOLN USE 1 VIAL VIA NEBULIZER EVERY 4-6 HOURS. 540 mL 0  . meloxicam (MOBIC) 7.5 MG tablet TAKE 1 TABLET BY MOUTH ONCE DAILY. 30 tablet 0  . olmesartan (BENICAR) 40 MG tablet TAKE ONE TABLET BY MOUTH DAILY 90 tablet 0  . pantoprazole (PROTONIX) 40 MG tablet TAKE ONE TABLET BY MOUTHONCE DAILY. 30 tablet 11  . risperiDONE (RISPERDAL) 1 MG tablet TAKE (1) TABLET BY MOUTH AT BEDTIME WITH 4MG. 90 tablet 0  . risperidone (RISPERDAL) 4 MG tablet TAKE ONE TABLET BY MOUTH IN THE EVENING. TAKE WITH 1MG TABLET. 90 tablet 0  . rosuvastatin (CRESTOR) 20 MG tablet Take 1 tablet (20 mg total) by mouth daily. 90 tablet 3  . tamsulosin (FLOMAX) 0.4 MG CAPS capsule Take 1 capsule (0.4 mg total) by mouth daily after supper. 90 capsule 3  . traZODone (DESYREL) 50 MG tablet TAKE ONE TABLET BY MOUTH AT BEDTIME AS NEEDED FOR SLEEP. 90 tablet 0  . triamterene-hydrochlorothiazide (MAXZIDE-25) 37.5-25 MG tablet TAKE (1/2) TABLET BY MOUTH ONCE DAILY. 45 tablet 1  . UNABLE TO FIND Nebulizer mouthpiece and tubing x 1  DX COPD 1 each 0  . UNABLE TO FIND XL pullups for daily use DX Incontinence 100 each 11  . UNABLE TO FIND Nebulizer machine Mask and tubing x 1  DX J44.9 1 each 0  . pravastatin (PRAVACHOL) 20  MG tablet TAKE 1 TABLET BY MOUTH ONCE DAILY AT BEDTIME FOR CHOLESTEROL. (Patient not taking: Reported on 02/23/2021) 90 tablet 0   No facility-administered medications prior to visit.    Allergies  Allergen Reactions  . Sertraline Hcl     Stomach upset/pain  . Wellbutrin [Bupropion] Other (See Comments)    Makes stomach hurt    ROS Review of Systems  Constitutional: Negative.   Respiratory: Negative.   Cardiovascular: Negative.   Musculoskeletal: Positive for back pain.       Radiates down both legs      Objective:    Physical Exam Constitutional:      Appearance: He is obese.  Cardiovascular:     Rate and Rhythm: Normal rate and regular rhythm.     Pulses: Normal pulses.     Heart sounds: Normal heart sounds.  Pulmonary:     Effort: Pulmonary effort is normal.     Breath sounds: Normal breath sounds.  Musculoskeletal:        General: No tenderness.     Comments: Positive straight leg raise to both legs  Neurological:     General: No focal deficit present.     Mental Status: He is alert and oriented to person, place, and time.     BP (!) 157/96   Pulse 97   Temp 98.4 F (36.9 C)   Resp (!) 22   Ht _0  (1.854 m)   Wt 252 lb (114.3 kg)   SpO2 95%   BMI 33.25 kg/m  Wt Readings from Last 3 Encounters:  02/23/21 252 lb (114.3 kg)  12/29/20 248 lb 9.6 oz (112.8 kg)  11/22/20 246 lb 9.6 oz (111.9 kg)     There are no preventive care reminders to display for this patient.  There are no preventive care reminders to display for this patient.  Lab Results  Component Value Date   TSH 1.31 05/26/2020   Lab Results  Component Value Date   WBC 7.3 05/26/2020   HGB 13.2 05/26/2020   HCT 40.3 05/26/2020   MCV 85.6 05/26/2020   PLT 267 05/26/2020   Lab Results  Component Value Date   NA 137 01/25/2021   K 3.9 01/25/2021   CO2 21 01/25/2021   GLUCOSE 144 (H) 01/25/2021   BUN 9 01/25/2021   CREATININE 1.37 (H) 01/25/2021   BILITOT 0.5 01/25/2021    ALKPHOS 65 01/25/2021   AST 56 (H) 01/25/2021   ALT 78 (H) 01/25/2021   PROT 7.4 01/25/2021   ALBUMIN 4.5 01/25/2021   CALCIUM 10.1 01/25/2021   ANIONGAP 13 03/10/2020   Lab Results  Component Value Date   CHOL 94 (L) 01/25/2021   Lab Results  Component Value Date   HDL 39 (L) 01/25/2021   Lab Results  Component Value Date   LDLCALC 28 01/25/2021   Lab Results  Component Value Date   TRIG 163 (H) 01/25/2021   Lab Results  Component Value Date   CHOLHDL 2.4 01/25/2021   Lab Results  Component Value Date   HGBA1C 7.3 (H) 01/25/2021      Assessment & Plan:   Problem List Items Addressed This Visit      Cardiovascular and Mediastinum   Hypertension    -BP elevated today despite taking olmesartan and triamterene-HCTZ; he is in pain today        Respiratory   COPD (chronic obstructive pulmonary disease) ([place])    -doing well with symbicort and duonebs, uses albuterol PRN -no med changes today        Endocrine   Diabetes mellitus type 2 in obese ([place])    -states blood sugars have been between 80s and 170s -no lows or extreme highs -no change in meds today        Other   Back pain with radiation    -Chronic pain in back, has been diagnosed with radiculopathy. -saw physical medicine recently; he should return to  Reduced mobility, trouble with rising from chair, and standing. Uses cane, encouraged to to continue this. -IM toradol today -encouraged to return to Dr. Ernestina Patches          No orders of the defined types were placed in this encounter.   Follow-up: Return in about 3 months (around 05/26/2021) for Lab follow-up (DM).    Noreene Larsson, NP

## 2021-02-23 NOTE — Assessment & Plan Note (Addendum)
-  Chronic pain in back, has been diagnosed with radiculopathy. -saw physical medicine recently; he should return to  Reduced mobility, trouble with rising from chair, and standing. Uses cane, encouraged to to continue this. -IM toradol today -encouraged to return to Dr. Ernestina Patches

## 2021-02-23 NOTE — Patient Instructions (Signed)
For your back pain with radiculopathy (nerve pain radiating down your legs), we gave you a toradol injection. You were recently referred to Dr. Magnus Sinning for this pain, so please return to his office for further treatment. He can be reached at [phone].

## 2021-02-23 NOTE — Assessment & Plan Note (Signed)
-  BP elevated today despite taking olmesartan and triamterene-HCTZ; he is in pain today

## 2021-03-01 DIAGNOSIS — J449 Chronic obstructive pulmonary disease, unspecified: Secondary | ICD-10-CM | POA: Diagnosis not present

## 2021-03-02 DIAGNOSIS — R748 Abnormal levels of other serum enzymes: Secondary | ICD-10-CM | POA: Diagnosis not present

## 2021-03-03 LAB — HEPATIC FUNCTION PANEL
ALT: 87 IU/L — ABNORMAL HIGH (ref 0–44)
AST: 56 IU/L — ABNORMAL HIGH (ref 0–40)
Albumin: 4.6 g/dL (ref 3.7–4.7)
Alkaline Phosphatase: 59 IU/L (ref 44–121)
Bilirubin Total: 0.4 mg/dL (ref 0.0–1.2)
Bilirubin, Direct: 0.13 mg/dL (ref 0.00–0.40)
Total Protein: 7.6 g/dL (ref 6.0–8.5)

## 2021-03-04 ENCOUNTER — Other Ambulatory Visit: Payer: Self-pay | Admitting: Family Medicine

## 2021-03-04 ENCOUNTER — Other Ambulatory Visit: Payer: Self-pay | Admitting: *Deleted

## 2021-03-04 DIAGNOSIS — K8020 Calculus of gallbladder without cholecystitis without obstruction: Secondary | ICD-10-CM

## 2021-03-04 DIAGNOSIS — R7989 Other specified abnormal findings of blood chemistry: Secondary | ICD-10-CM

## 2021-03-06 DIAGNOSIS — J441 Chronic obstructive pulmonary disease with (acute) exacerbation: Secondary | ICD-10-CM | POA: Diagnosis not present

## 2021-03-06 DIAGNOSIS — J42 Unspecified chronic bronchitis: Secondary | ICD-10-CM | POA: Diagnosis not present

## 2021-03-06 DIAGNOSIS — J449 Chronic obstructive pulmonary disease, unspecified: Secondary | ICD-10-CM | POA: Diagnosis not present

## 2021-03-11 ENCOUNTER — Ambulatory Visit (HOSPITAL_COMMUNITY)
Admission: RE | Admit: 2021-03-11 | Discharge: 2021-03-11 | Disposition: A | Discharge status: Home / Self Care | Payer: Medicare Other | Source: Ambulatory Visit | Attending: Family Medicine | Admitting: Family Medicine

## 2021-03-11 DIAGNOSIS — K8020 Calculus of gallbladder without cholecystitis without obstruction: Secondary | ICD-10-CM | POA: Diagnosis not present

## 2021-03-11 DIAGNOSIS — R7989 Other specified abnormal findings of blood chemistry: Secondary | ICD-10-CM | POA: Insufficient documentation

## 2021-03-17 NOTE — Progress Notes (Signed)
[place] of liver was negative for masses or cirrhosis. May have some fatty liver, but noting acutely concerning on imaging.

## 2021-03-18 ENCOUNTER — Other Ambulatory Visit: Payer: Self-pay | Admitting: Family Medicine

## 2021-03-18 DIAGNOSIS — J449 Chronic obstructive pulmonary disease, unspecified: Secondary | ICD-10-CM

## 2021-03-28 ENCOUNTER — Other Ambulatory Visit: Payer: Self-pay | Admitting: Family Medicine

## 2021-04-01 DIAGNOSIS — J449 Chronic obstructive pulmonary disease, unspecified: Secondary | ICD-10-CM | POA: Diagnosis not present

## 2021-04-04 ENCOUNTER — Other Ambulatory Visit: Payer: Medicare Other

## 2021-04-05 ENCOUNTER — Other Ambulatory Visit: Payer: 59

## 2021-04-05 ENCOUNTER — Other Ambulatory Visit: Payer: Self-pay

## 2021-04-05 DIAGNOSIS — R972 Elevated prostate specific antigen [PSA]: Secondary | ICD-10-CM

## 2021-04-06 DIAGNOSIS — J42 Unspecified chronic bronchitis: Secondary | ICD-10-CM | POA: Diagnosis not present

## 2021-04-06 DIAGNOSIS — J449 Chronic obstructive pulmonary disease, unspecified: Secondary | ICD-10-CM | POA: Diagnosis not present

## 2021-04-06 DIAGNOSIS — J441 Chronic obstructive pulmonary disease with (acute) exacerbation: Secondary | ICD-10-CM | POA: Diagnosis not present

## 2021-04-06 LAB — PSA: Prostate Specific Ag, Serum: 9.9 ng/mL — ABNORMAL HIGH (ref 0.0–4.0)

## 2021-04-11 ENCOUNTER — Ambulatory Visit: Payer: Medicare Other | Admitting: Urology

## 2021-04-11 DIAGNOSIS — R339 Retention of urine, unspecified: Secondary | ICD-10-CM

## 2021-04-11 DIAGNOSIS — N138 Other obstructive and reflux uropathy: Secondary | ICD-10-CM

## 2021-04-14 DIAGNOSIS — R7989 Other specified abnormal findings of blood chemistry: Secondary | ICD-10-CM | POA: Diagnosis not present

## 2021-04-15 ENCOUNTER — Other Ambulatory Visit: Payer: Self-pay | Admitting: Family Medicine

## 2021-04-15 DIAGNOSIS — J449 Chronic obstructive pulmonary disease, unspecified: Secondary | ICD-10-CM

## 2021-04-15 LAB — HEPATIC FUNCTION PANEL
ALT: 97 IU/L — ABNORMAL HIGH (ref 0–44)
AST: 60 IU/L — ABNORMAL HIGH (ref 0–40)
Albumin: 4.5 g/dL (ref 3.7–4.7)
Alkaline Phosphatase: 58 IU/L (ref 44–121)
Bilirubin Total: 0.4 mg/dL (ref 0.0–1.2)
Bilirubin, Direct: 0.15 mg/dL (ref 0.00–0.40)
Total Protein: 7.3 g/dL (ref 6.0–8.5)

## 2021-04-19 ENCOUNTER — Other Ambulatory Visit: Payer: Self-pay | Admitting: Family Medicine

## 2021-04-19 DIAGNOSIS — M25551 Pain in right hip: Secondary | ICD-10-CM

## 2021-04-20 ENCOUNTER — Ambulatory Visit: Payer: 59 | Admitting: Podiatry

## 2021-04-21 ENCOUNTER — Other Ambulatory Visit: Payer: Self-pay | Admitting: Family Medicine

## 2021-04-21 DIAGNOSIS — R7989 Other specified abnormal findings of blood chemistry: Secondary | ICD-10-CM

## 2021-04-21 NOTE — Progress Notes (Signed)
mb gatro

## 2021-04-22 ENCOUNTER — Other Ambulatory Visit: Payer: Self-pay | Admitting: Family Medicine

## 2021-04-22 DIAGNOSIS — E1169 Type 2 diabetes mellitus with other specified complication: Secondary | ICD-10-CM

## 2021-04-22 DIAGNOSIS — E669 Obesity, unspecified: Secondary | ICD-10-CM

## 2021-04-22 DIAGNOSIS — F209 Schizophrenia, unspecified: Secondary | ICD-10-CM

## 2021-04-29 ENCOUNTER — Encounter: Payer: Self-pay | Admitting: Podiatry

## 2021-04-29 ENCOUNTER — Other Ambulatory Visit: Payer: Self-pay

## 2021-04-29 ENCOUNTER — Ambulatory Visit (INDEPENDENT_AMBULATORY_CARE_PROVIDER_SITE_OTHER): Payer: Medicare Other | Admitting: Podiatry

## 2021-04-29 DIAGNOSIS — E1140 Type 2 diabetes mellitus with diabetic neuropathy, unspecified: Secondary | ICD-10-CM

## 2021-04-29 DIAGNOSIS — B351 Tinea unguium: Secondary | ICD-10-CM | POA: Diagnosis not present

## 2021-04-29 DIAGNOSIS — E1149 Type 2 diabetes mellitus with other diabetic neurological complication: Secondary | ICD-10-CM

## 2021-04-29 DIAGNOSIS — M79676 Pain in unspecified toe(s): Secondary | ICD-10-CM | POA: Diagnosis not present

## 2021-04-29 DIAGNOSIS — Q828 Other specified congenital malformations of skin: Secondary | ICD-10-CM | POA: Diagnosis not present

## 2021-04-29 DIAGNOSIS — M2141 Flat foot [pes planus] (acquired), right foot: Secondary | ICD-10-CM

## 2021-04-29 DIAGNOSIS — M2142 Flat foot [pes planus] (acquired), left foot: Secondary | ICD-10-CM

## 2021-04-29 NOTE — Progress Notes (Signed)
This patient returns to my office for at risk foot care.  This patient requires this care by a professional since this patient will be at risk due to having diabetes mellitus and chronic kidney disease.  Patient has pain in the two calluses on the bottom outside of his feet.  This patient is unable to cut nails himself since the patient cannot reach his nails.These nails are painful walking and wearing shoes. He cannot treat the painful calluses himself. . This patient presents for at risk foot care today.  General Appearance  Alert, conversant and in no acute stress.  Vascular  Dorsalis pedis and posterior tibial  pulses are palpable  bilaterally.  Capillary return is within normal limits  bilaterally. Temperature is within normal limits  bilaterally.  Neurologic  Senn-Weinstein monofilament wire test diminished   bilaterally. Muscle power within normal limits bilaterally.  Nails Thick disfigured discolored nails with subungual debris  from hallux to fifth toes bilaterally. No evidence of bacterial infection or drainage bilaterally.  Orthopedic  No limitations of motion  feet .  No crepitus or effusions noted.  No bony pathology or digital deformities noted.  Skin  normotropic skin  noted bilaterally.  No signs of infections or ulcers noted.   Porokeratosis sub 5th metabase  B/L.  Onychomycosis  Pain in right toes  Pain in left toes  Porokeratosis  X 2.  Consent was obtained for treatment procedures.   Mechanical debridement of nails 1-5  bilaterally performed with a nail nipper.  Filed with dremel without incident. Debridement of porokeratosis  with # 15 blade.     Return office visit   3 months                  Told patient to return for periodic foot care and evaluation due to potential at risk complications.   Kasyn Rolph DPM  

## 2021-05-01 DIAGNOSIS — J449 Chronic obstructive pulmonary disease, unspecified: Secondary | ICD-10-CM | POA: Diagnosis not present

## 2021-05-06 DIAGNOSIS — J449 Chronic obstructive pulmonary disease, unspecified: Secondary | ICD-10-CM | POA: Diagnosis not present

## 2021-05-06 DIAGNOSIS — J42 Unspecified chronic bronchitis: Secondary | ICD-10-CM | POA: Diagnosis not present

## 2021-05-06 DIAGNOSIS — J441 Chronic obstructive pulmonary disease with (acute) exacerbation: Secondary | ICD-10-CM | POA: Diagnosis not present

## 2021-05-19 ENCOUNTER — Other Ambulatory Visit: Payer: Self-pay | Admitting: Family Medicine

## 2021-05-19 DIAGNOSIS — J449 Chronic obstructive pulmonary disease, unspecified: Secondary | ICD-10-CM

## 2021-05-20 ENCOUNTER — Other Ambulatory Visit: Payer: Self-pay | Admitting: Family Medicine

## 2021-05-20 DIAGNOSIS — M25551 Pain in right hip: Secondary | ICD-10-CM

## 2021-05-23 ENCOUNTER — Telehealth: Payer: Self-pay

## 2021-05-23 ENCOUNTER — Other Ambulatory Visit: Payer: Self-pay

## 2021-05-23 DIAGNOSIS — J449 Chronic obstructive pulmonary disease, unspecified: Secondary | ICD-10-CM

## 2021-05-23 MED ORDER — ALBUTEROL SULFATE HFA 108 (90 BASE) MCG/ACT IN AERS: 2.0000 | INHALATION_SPRAY | Freq: Four times a day (QID) | RESPIRATORY_TRACT | 5 refills | Status: DC | PRN

## 2021-05-23 NOTE — Telephone Encounter (Signed)
Patient need med refills:  albuterol (VENTOLIN HFA) 108 (90 Base) MCG/ACT inhaler   Pharmacy: [facility]

## 2021-05-23 NOTE — Telephone Encounter (Signed)
Med refilled.

## 2021-05-24 ENCOUNTER — Ambulatory Visit: Payer: Medicare Other | Admitting: Family Medicine

## 2021-05-26 ENCOUNTER — Ambulatory Visit: Payer: Medicare Other | Admitting: Nurse Practitioner

## 2021-06-01 DIAGNOSIS — J449 Chronic obstructive pulmonary disease, unspecified: Secondary | ICD-10-CM | POA: Diagnosis not present

## 2021-06-03 ENCOUNTER — Other Ambulatory Visit: Payer: Self-pay

## 2021-06-03 ENCOUNTER — Ambulatory Visit (INDEPENDENT_AMBULATORY_CARE_PROVIDER_SITE_OTHER): Payer: Medicare Other | Admitting: Internal Medicine

## 2021-06-03 ENCOUNTER — Encounter: Payer: Self-pay | Admitting: Internal Medicine

## 2021-06-03 VITALS — BP 149/84 | HR 95 | Temp 98.7°F | Resp 18 | Ht 73.0 in | Wt 249.4 lb

## 2021-06-03 DIAGNOSIS — K219 Gastro-esophageal reflux disease without esophagitis: Secondary | ICD-10-CM

## 2021-06-03 MED ORDER — SUCRALFATE 1 G PO TABS: 1.0000 g | ORAL_TABLET | Freq: Four times a day (QID) | ORAL | 0 refills | Status: DC | PRN

## 2021-06-03 NOTE — Progress Notes (Signed)
Acute Office Visit  Subjective:    Patient ID: Matthew Molina, male    DOB: 28-Feb-1951, 71 y.o.   MRN: [id number]  Chief Complaint  Patient presents with   Abdominal Pain    Pt having stomach pain was drinking 2-3 beers per day stopped doing this and stomach started hurting this has been going on for about 3 days. Today it feels a little better pain goes around and toward the top of stomach     HPI Patient is in today for evaluation of epigastric pain for last 3 days, which is better today. He states that it gets better with food intake at times. Denies any vomiting, melena or hematochezia. Has BM everyday with stool softner. Denies any fever or chills. Takes Pantoprazole for GERD. Reports quitting alcohol use recently and thinks his symptoms started after that.  Past Medical History:  Diagnosis Date   Acute blood loss anemia 01/05/2020   Acute kidney injury ([place])    Acute respiratory failure with hypoxia (H[facility])    Arthritis    COPD (chronic obstructive pulmonary disease) ([place])    Depression    Diabetes mellitus    Diabetes mellitus without complication ([place])    Diverticulitis    Elevated PSA 10/01/2016   Encounter for support and coordination of transition of care 01/14/2020   Hypercholesterolemia    Hyperlipidemia    Hypertension    Insomnia 01/05/2016   Ischemic colitis ([place]) 01/05/2020   Multiple lung nodules on CT 04/02/2015   Nicotine addiction    Obesity    Oxygen deficiency    qhs   Schizophrenia [facility])     Past Surgical History:  Procedure Laterality Date   CATARACT EXTRACTION W/PHACO Left 11/20/2013   Procedure: CATARACT EXTRACTION PHACO AND INTRAOCULAR LENS PLACEMENT ([place]);  Surgeon: Tonny Branch, MD;  Location: [facility];  Service: Ophthalmology;  Laterality: Left;  CDE:10.26   CATARACT EXTRACTION W/PHACO Right 12/08/2013   Procedure: RIGHT EYE CATARACT EXTRACTION PHACO AND INTRAOCULAR LENS PLACEMENT ;  Surgeon: Tonny Branch, MD;  Location: [facility];  Service:  Ophthalmology;  Laterality: Right;  CDE 12.38   COLONOSCOPY N/A 04/01/2013   pancolonic diverticulosis, redundant colon, large internal hemorrhoids.    COLONOSCOPY  2014   INCOMPLETE PREP IN R COLON   COLONOSCOPY N/A 04/01/2020   Procedure: COLONOSCOPY;  Surgeon: Danie Binder, MD;  Location: [facility];  Service: Endoscopy;  Laterality: N/A;  8:45am   ESOPHAGOGASTRODUODENOSCOPY N/A 02/18/2016   stricture at GE junction, moderate erosive gastritis and mild non-erosive duodenitis. +H.pylori. Treated initially with Prevpac. Breath test to check for eradication was positive, and he was prescribed Pylera.    EYE SURGERY Left 11/2013   cataract extraction   POLYPECTOMY  04/01/2020   Procedure: POLYPECTOMY;  Surgeon: Danie Binder, MD;  Location: [facility];  Service: Endoscopy;;   SAVORY DILATION N/A 02/18/2016   Procedure: SAVORY DILATION;  Surgeon: Danie Binder, MD;  Location: [facility];  Service: Endoscopy;  Laterality: N/A;   SHOULDER SURGERY      Family History  Problem Relation Age of Onset   Diabetes Mother    Hypertension Mother    Stroke Mother    Diabetes Sister    Heart disease Sister    Kidney disease Father    Drug abuse Brother    Colon cancer Neg Hx    Colon polyps Neg Hx     Social History   Socioeconomic History   Marital status: Divorced  Spouse name: Not on file   Number of children: 2   Years of education: Not on file   Highest education level: Not on file  Occupational History   Occupation: retired from [employer] Use   Smoking status: Every Day    Packs/day: 1.00    Years: 48.00    Pack years: 48.00    Types: Cigarettes    Last attempt to quit: 05/02/2017    Years since quitting: 4.0   Smokeless tobacco: Never  Vaping Use   Vaping Use: Never used  Substance and Sexual Activity   Alcohol use: Yes    Alcohol/week: 0.0 standard drinks    Comment: "little bit of beer"   Drug use: No   Sexual activity: Not Currently    Birth  control/protection: None  Other Topics Concern   Not on file  Social History Narrative   ** Merged History Encounter **       ** Merged History Encounter **       Social Determinants of Health   Financial Resource Strain: Low Risk    Difficulty of Paying Living Expenses: Not very hard  Food Insecurity: No Food Insecurity   Worried About Charity fundraiser in the Last Year: Never true   Ran Out of Food in the Last Year: Never true  Transportation Needs: No Transportation Needs   Lack of Transportation (Medical): No   Lack of Transportation (Non-Medical): No  Physical Activity: Insufficiently Active   Days of Exercise per Week: 2 days   Minutes of Exercise per Session: 10 min  Stress: No Stress Concern Present   Feeling of Stress : Not at all  Social Connections: Socially Isolated   Frequency of Communication with Friends and Family: More than three times a week   Frequency of Social Gatherings with Friends and Family: Three times a week   Attends Religious Services: Never   Active Member of Clubs or Organizations: No   Attends Archivist Meetings: Never   Marital Status: Divorced  Human resources officer Violence: Not on file    Outpatient Medications Prior to Visit  Medication Sig Dispense Refill   ACCU-CHEK GUIDE test strip USE AS DIRECTED TO CHECK BLOOD SUGAR ONCE DAILY. 50 strip 0   Accu-Chek Softclix Lancets lancets TEST BLOOD SUGAR ONCE DAILY AS DIRECTED. 100 each 5   acetaminophen (TYLENOL) 500 MG tablet Take 500-1,000 mg by mouth daily as needed for headache. Takes mostly for HA about 2 x/week.     albuterol (VENTOLIN HFA) 108 (90 Base) MCG/ACT inhaler Inhale 2 puffs into the lungs every 6 (six) hours as needed for wheezing or shortness of breath. 8.5 g 5   blood glucose meter kit and supplies Dispense based on patient and insurance preference.Use to check blood sugar once daily (FOR ICD-10 E10.9, E11.9). 1 each 0   Blood Glucose Monitoring Suppl (ACCU-CHEK AVIVA  PLUS) w/Device KIT accucheck aviva meter Once daily testing DX e11.9 1 kit 0   budesonide-formoterol (SYMBICORT) 160-4.5 MCG/ACT inhaler Inhale 2 puffs into the lungs 2 (two) times daily. 1 each 3   busPIRone (BUSPAR) 7.5 MG tablet TAKE 1 TABLET BY MOUTH ONCE DAILY. 90 tablet 0   docusate sodium (COLACE) 100 MG capsule Take 100 mg by mouth daily as needed for mild constipation.      ezetimibe (ZETIA) 10 MG tablet TAKE 1 TABLET BY MOUTH ONCE DAILY. 90 tablet 0   glipiZIDE (GLUCOTROL XL) 10 MG 24 hr tablet TAKE 1  TABLEY BY MOUTH ONCE DAILY. 90 tablet 0   ipratropium-albuterol (DUONEB) 0.5-2.5 (3) MG/3ML SOLN USE 1 VIAL VIA NEBULIZER EVERY 4-6 HOURS. 540 mL 0   meloxicam (MOBIC) 7.5 MG tablet TAKE 1 TABLET BY MOUTH ONCE DAILY. 30 tablet 2   olmesartan (BENICAR) 40 MG tablet TAKE ONE TABLET BY MOUTH DAILY 90 tablet 0   pantoprazole (PROTONIX) 40 MG tablet TAKE ONE TABLET BY MOUTHONCE DAILY. 30 tablet 11   pravastatin (PRAVACHOL) 20 MG tablet TAKE 1 TABLET BY MOUTH ONCE DAILY AT BEDTIME FOR CHOLESTEROL. 90 tablet 0   risperiDONE (RISPERDAL) 1 MG tablet TAKE (1) TABLET BY MOUTH AT BEDTIME WITH $RemoveBefo'4MG'ZsgUMsxmGei$ . 90 tablet 0   risperidone (RISPERDAL) 4 MG tablet TAKE ONE TABLET BY MOUTH IN THE EVENING. TAKE WITH $RemoveB'1MG'UFfNIvgg$  TABLET. 90 tablet 0   rosuvastatin (CRESTOR) 20 MG tablet Take 1 tablet (20 mg total) by mouth daily. 90 tablet 3   tamsulosin (FLOMAX) 0.4 MG CAPS capsule Take 1 capsule (0.4 mg total) by mouth daily after supper. 90 capsule 3   traZODone (DESYREL) 50 MG tablet TAKE ONE TABLET BY MOUTH AT BEDTIME AS NEEDED FOR SLEEP. 90 tablet 0   triamterene-hydrochlorothiazide (MAXZIDE-25) 37.5-25 MG tablet TAKE (1/2) TABLET BY MOUTH ONCE DAILY. 45 tablet 1   UNABLE TO FIND Nebulizer mouthpiece and tubing x 1  DX COPD 1 each 0   UNABLE TO FIND XL pullups for daily use DX Incontinence 100 each 11   UNABLE TO FIND Nebulizer machine Mask and tubing x 1  DX J44.9 1 each 0   No facility-administered medications prior  to visit.    Allergies  Allergen Reactions   Sertraline Hcl     Stomach upset/pain   Wellbutrin [Bupropion] Other (See Comments)    Makes stomach hurt    Review of Systems  Constitutional:  Negative for chills and fever.  Respiratory:  Negative for cough and shortness of breath.   Cardiovascular:  Negative for chest pain and palpitations.  Gastrointestinal:  Positive for abdominal pain and constipation. Negative for nausea and vomiting.  Genitourinary:  Negative for dysuria and hematuria.  Skin:  Negative for rash.      Objective:    Physical Exam Vitals reviewed.  Constitutional:      General: He is not in acute distress.    Appearance: He is not diaphoretic.  HENT:     Head: Normocephalic and atraumatic.     Nose: Nose normal.     Mouth/Throat:     Mouth: Mucous membranes are moist.  Eyes:     General: No scleral icterus.    Extraocular Movements: Extraocular movements intact.     Pupils: Pupils are equal, round, and reactive to light.  Cardiovascular:     Rate and Rhythm: Normal rate and regular rhythm.     Pulses: Normal pulses.     Heart sounds: Normal heart sounds. No murmur heard. Pulmonary:     Breath sounds: Normal breath sounds. No wheezing or rales.  Abdominal:     General: Bowel sounds are normal.     Palpations: Abdomen is soft.     Tenderness: There is abdominal tenderness (Mild (epigastric)) in the epigastric area. There is no guarding or rebound.  Musculoskeletal:     Cervical back: Neck supple. No tenderness.     Right lower leg: No edema.     Left lower leg: No edema.  Skin:    General: Skin is warm.     Findings: No rash.  Neurological:  General: No focal deficit present.     Mental Status: He is alert and oriented to person, place, and time.  Psychiatric:        Mood and Affect: Mood normal.        Behavior: Behavior normal.    BP (!) 149/84 (BP Location: Left Arm, Patient Position: Sitting, Cuff Size: Normal)   Pulse 95   Temp  98.7 F (37.1 C) (Oral)   Resp 18   Ht _0  (1.854 m)   Wt 249 lb 6.4 oz (113.1 kg)   SpO2 91%   BMI 32.90 kg/m  Wt Readings from Last 3 Encounters:  06/03/21 249 lb 6.4 oz (113.1 kg)  02/23/21 252 lb (114.3 kg)  12/29/20 248 lb 9.6 oz (112.8 kg)    Health Maintenance Due  Topic Date Due   Zoster Vaccines- Shingrix (2 of 2) 07/30/2018   COVID-19 Vaccine (4 - Booster for Moderna series) 02/19/2021   OPHTHALMOLOGY EXAM  05/11/2021    There are no preventive care reminders to display for this patient.   Lab Results  Component Value Date   TSH 1.31 05/26/2020   Lab Results  Component Value Date   WBC 7.3 05/26/2020   HGB 13.2 05/26/2020   HCT 40.3 05/26/2020   MCV 85.6 05/26/2020   PLT 267 05/26/2020   Lab Results  Component Value Date   NA 137 01/25/2021   K 3.9 01/25/2021   CO2 21 01/25/2021   GLUCOSE 144 (H) 01/25/2021   BUN 9 01/25/2021   CREATININE 1.37 (H) 01/25/2021   BILITOT 0.4 04/14/2021   ALKPHOS 58 04/14/2021   AST 60 (H) 04/14/2021   ALT 97 (H) 04/14/2021   PROT 7.3 04/14/2021   ALBUMIN 4.5 04/14/2021   CALCIUM 10.1 01/25/2021   ANIONGAP 13 03/10/2020   Lab Results  Component Value Date   CHOL 94 (L) 01/25/2021   Lab Results  Component Value Date   HDL 39 (L) 01/25/2021   Lab Results  Component Value Date   LDLCALC 28 01/25/2021   Lab Results  Component Value Date   TRIG 163 (H) 01/25/2021   Lab Results  Component Value Date   CHOLHDL 2.4 01/25/2021   Lab Results  Component Value Date   HGBA1C 7.3 (H) 01/25/2021       Assessment & Plan:   Problem List Items Addressed This Visit       Digestive   GERD (gastroesophageal reflux disease) - Primary   Relevant Medications   sucralfate (CARAFATE) 1 g tablet     Meds ordered this encounter  Medications   sucralfate (CARAFATE) 1 g tablet    Sig: Take 1 tablet (1 g total) by mouth 4 (four) times daily as needed (Acid reflux/abdominal pain/burning).    Dispense:  30 tablet     Refill:  0     Dorris Pierre Keith Rake, MD

## 2021-06-03 NOTE — Assessment & Plan Note (Signed)
Epigastric pain consistent with GERD/gastritis Smoking a risk factor, recently quit alcohol use Pantoprazole 40 mg QD Added PRN Carafate Avoid hot and spicy food If persistent pain, will check CT abdomen

## 2021-06-03 NOTE — Patient Instructions (Signed)
Please continue to take Pantoprazole as prescribed.  Please take Carafate as needed for

## 2021-06-06 DIAGNOSIS — J449 Chronic obstructive pulmonary disease, unspecified: Secondary | ICD-10-CM | POA: Diagnosis not present

## 2021-06-06 DIAGNOSIS — J42 Unspecified chronic bronchitis: Secondary | ICD-10-CM | POA: Diagnosis not present

## 2021-06-06 DIAGNOSIS — J441 Chronic obstructive pulmonary disease with (acute) exacerbation: Secondary | ICD-10-CM | POA: Diagnosis not present

## 2021-06-13 ENCOUNTER — Ambulatory Visit: Payer: Medicare Other | Admitting: Urology

## 2021-06-13 ENCOUNTER — Other Ambulatory Visit: Payer: Self-pay | Admitting: Family Medicine

## 2021-06-23 ENCOUNTER — Ambulatory Visit (INDEPENDENT_AMBULATORY_CARE_PROVIDER_SITE_OTHER): Payer: Medicare Other | Admitting: Family Medicine

## 2021-06-23 ENCOUNTER — Telehealth: Payer: Self-pay

## 2021-06-23 ENCOUNTER — Encounter: Payer: Self-pay | Admitting: Family Medicine

## 2021-06-23 ENCOUNTER — Other Ambulatory Visit: Payer: Self-pay

## 2021-06-23 VITALS — BP 120/80 | HR 96 | Temp 98.6°F | Resp 22 | Ht 73.0 in | Wt 250.0 lb

## 2021-06-23 DIAGNOSIS — E1169 Type 2 diabetes mellitus with other specified complication: Secondary | ICD-10-CM

## 2021-06-23 DIAGNOSIS — M791 Myalgia, unspecified site: Secondary | ICD-10-CM

## 2021-06-23 DIAGNOSIS — E669 Obesity, unspecified: Secondary | ICD-10-CM

## 2021-06-23 DIAGNOSIS — M549 Dorsalgia, unspecified: Secondary | ICD-10-CM

## 2021-06-23 DIAGNOSIS — I10 Essential (primary) hypertension: Secondary | ICD-10-CM

## 2021-06-23 DIAGNOSIS — Z720 Tobacco use: Secondary | ICD-10-CM | POA: Diagnosis not present

## 2021-06-23 DIAGNOSIS — E559 Vitamin D deficiency, unspecified: Secondary | ICD-10-CM

## 2021-06-23 DIAGNOSIS — J42 Unspecified chronic bronchitis: Secondary | ICD-10-CM | POA: Diagnosis not present

## 2021-06-23 DIAGNOSIS — R5383 Other fatigue: Secondary | ICD-10-CM

## 2021-06-23 DIAGNOSIS — E785 Hyperlipidemia, unspecified: Secondary | ICD-10-CM | POA: Diagnosis not present

## 2021-06-23 DIAGNOSIS — F17210 Nicotine dependence, cigarettes, uncomplicated: Secondary | ICD-10-CM | POA: Diagnosis not present

## 2021-06-23 MED ORDER — METHYLPREDNISOLONE ACETATE 80 MG/ML IJ SUSP
40.0000 mg | Freq: Once | INTRAMUSCULAR | Status: AC
  Administered 2021-06-23: 40 mg via INTRAMUSCULAR

## 2021-06-23 NOTE — Patient Instructions (Addendum)
Annual exam in end September , call if you neeed me sooner  HBA1C, cmp and EGFR , CBC, TSH, Vit D today  You are referred  for eye exam/ or arrange in house testing , important you get this done  Depo medrol 40 mg iM inm office today for Back pain and 7 day course of prednisone is prescribed  Need to cut back on smoking, need to quit, now smoking 2 PPD!!!  Thanks for choosing [facility], we consider it a privelige to serve you.

## 2021-06-23 NOTE — Assessment & Plan Note (Addendum)
DASH diet and commitment to daily physical activity for a minimum of 30 minutes discussed and encouraged, as a part of hypertension management. The importance of attaining a healthy weight is also discussed. Controlled, no change in medication  BP/Weight 06/23/2021 06/03/2021 02/23/2021 12/29/2020 11/22/2020 11/03/2020 13/88/7195  Systolic BP 974 718 550 158 682 574 935  Diastolic BP 88 84 96 87 80 93 98  Wt. (Lbs) 250 249.4 252 248.6 246.6 - 240  BMI 32.98 32.9 33.25 32.8 32.53 - 31.66  Some encounter information is confidential and restricted. Go to Review Flowsheets activity to see all data.

## 2021-06-23 NOTE — Progress Notes (Signed)
Matthew Molina     MRN: [id number]      DOB: Nov 11, 1951   HPI Matthew Molina is here for follow up and re-evaluation of chronic medical conditions, medication management and review of any available recent lab and radiology data.  Preventive health is updated, specifically  Cancer screening and Immunization.   2 week h/o increased low back pain , wants shots, will try in office first, if not successful wants  referral to pain management / Dr Merlene Laughter Smoking increased to 2 PPd no plan to quit at this time, counseled Denies polyuria, polydipsia, blurred vision , or hypoglycemic episodes. Not testing regularly ROS Denies recent fever or chills. Denies sinus pressure, nasal congestion, ear pain or sore throat. Denies chest congestion, productive cough or wheezing. Denies chest pains, palpitations and leg swelling Denies abdominal pain, nausea, vomiting,diarrhea or constipation.   Denies dysuria, frequency, hesitancy or incontinence. C/o chronic joint pain, and limitation in mobility worse in the past month, also generalized muscle aches Denies headaches, seizures, c/o numbness, and  tingling.in legs Denies uncontrolled  depression, anxiety or insomnia. Denies skin break down or rash.   PE  BP 120/80   Pulse 96   Temp 98.6 F (37 C)   Resp (!) 22   Ht 6\' 1"  (1.854 m)   Wt 250 lb (113.4 kg)   SpO2 94%   BMI 32.98 kg/m    Patient alert and oriented and in no cardiopulmonary distress.  HEENT: No facial asymmetry, EOMI,     Neck supple .  Chest: Clear to auscultation bilaterally.decreased air entry  CVS: S1, S2 no murmurs, no S3.Regular rate.  ABD: Soft non tender.   Ext: No edema  MS: Decreased  ROM spine, shoulders, hips and knees.  Skin: Intact, no ulcerations or rash noted.  Psych: Good eye contact, normal affect. not anxious or depressed appearing.  CNS: CN 2-12 intact, power,  normal throughout.no focal deficits noted.   Assessment & Plan  Back pain with  radiation Depo medrol 40 mg and short course of prednisone, increased and uncontrolled  Diabetes mellitus type 2 in obese [facility]) Matthew Molina is reminded of the importance of commitment to daily physical activity for 30 minutes or more, as able and the need to limit carbohydrate intake to 30 to 60 grams per meal to help with blood sugar control.   The need to take medication as prescribed, test blood sugar as directed, and to call between visits if there is a concern that blood sugar is uncontrolled is also discussed.   Matthew Molina is reminded of the importance of daily foot exam, annual eye examination, and good blood sugar, blood pressure and cholesterol control. Uncontrolled , increase glipizide dose to 20 mg daily  Diabetic Labs Latest Ref Rng & Units 01/25/2021 09/13/2020 05/27/2020 05/26/2020 05/26/2020  HbA1c 4.8 - 5.6 % 7.3(H) 6.5(A) 7.8(A) - -  Microalbumin Not Estab. ug/mL - - - 36.7(H) 1.8  Micro/Creat Ratio 0.0 - 30.0 mg/g creat - - - - -  Chol 100 - 199 mg/dL 94(L) - - - 183  HDL >39 mg/dL 39(L) - - - 34(L)  Calc LDL 0 - 99 mg/dL 28 - - - 104(H)  Triglycerides 0 - 149 mg/dL 163(H) - - - 341(H)  Creatinine 0.76 - 1.27 mg/dL 1.37(H) - - - 1.38(H)   BP/Weight 06/23/2021 06/03/2021 02/23/2021 12/29/2020 11/22/2020 11/03/2020 25/85/2778  Systolic BP 242 353 614 431 540 086 761  Diastolic BP 88 84 96 87 80 93  98  Wt. (Lbs) 250 249.4 252 248.6 246.6 - 240  BMI 32.98 32.9 33.25 32.8 32.53 - 31.66  Some encounter information is confidential and restricted. Go to Review Flowsheets activity to see all data.   Foot/eye exam completion dates Latest Ref Rng & Units 10/08/2019 10/15/2018  Eye Exam No Retinopathy - No Retinopathy  Foot Form Completion - Done -      Updated lab needed at/ before next visit.   Hypertension DASH diet and commitment to daily physical activity for a minimum of 30 minutes discussed and encouraged, as a part of hypertension management. The importance of attaining a  healthy weight is also discussed. Controlled, no change in medication  BP/Weight 06/23/2021 06/03/2021 02/23/2021 12/29/2020 11/22/2020 11/03/2020 67/20/9470  Systolic BP 962 836 629 476 546 503 546  Diastolic BP 88 84 96 87 80 93 98  Wt. (Lbs) 250 249.4 252 248.6 246.6 - 240  BMI 32.98 32.9 33.25 32.8 32.53 - 31.66  Some encounter information is confidential and restricted. Go to Review Flowsheets activity to see all data.       Tobacco abuse Asked:confirms currently smokes cigarettes, 2 PPD, increased , no plan to quit at this time Assess: Unwilling to set a quit date, but is cutting back Advise: needs to QUIT to reduce risk of cancer, cardio and cerebrovascular disease Assist: counseled for 5 minutes and literature provided Arrange: follow up in 2 to 4 months   Hyperlipidemia Hyperlipidemia:Low fat diet discussed and encouraged.   Lipid Panel  Lab Results  Component Value Date   CHOL 94 (L) 01/25/2021   HDL 39 (L) 01/25/2021   LDLCALC 28 01/25/2021   TRIG 163 (H) 01/25/2021   CHOLHDL 2.4 01/25/2021     Needs to lower fat intake , stay on zetia only, statin contraindicated, muscle pain and increasing liver enzymes

## 2021-06-23 NOTE — Assessment & Plan Note (Addendum)
Asked:confirms currently smokes cigarettes, 2 PPD, increased , no plan to quit at this time Assess: Unwilling to set a quit date, but is cutting back Advise: needs to QUIT to reduce risk of cancer, cardio and cerebrovascular disease Assist: counseled for 5 minutes and literature provided Arrange: follow up in 2 to 4 months

## 2021-06-23 NOTE — Assessment & Plan Note (Addendum)
Depo medrol 40 mg and short course of prednisone, increased and uncontrolled

## 2021-06-23 NOTE — Assessment & Plan Note (Addendum)
Matthew Molina is reminded of the importance of commitment to daily physical activity for 30 minutes or more, as able and the need to limit carbohydrate intake to 30 to 60 grams per meal to help with blood sugar control.   The need to take medication as prescribed, test blood sugar as directed, and to call between visits if there is a concern that blood sugar is uncontrolled is also discussed.   Matthew Molina is reminded of the importance of daily foot exam, annual eye examination, and good blood sugar, blood pressure and cholesterol control. Uncontrolled , increase glipizide dose to 20 mg daily  Diabetic Labs Latest Ref Rng & Units 01/25/2021 09/13/2020 05/27/2020 05/26/2020 05/26/2020  HbA1c 4.8 - 5.6 % 7.3(H) 6.5(A) 7.8(A) - -  Microalbumin Not Estab. ug/mL - - - 36.7(H) 1.8  Micro/Creat Ratio 0.0 - 30.0 mg/g creat - - - - -  Chol 100 - 199 mg/dL 94(L) - - - 183  HDL >39 mg/dL 39(L) - - - 34(L)  Calc LDL 0 - 99 mg/dL 28 - - - 104(H)  Triglycerides 0 - 149 mg/dL 163(H) - - - 341(H)  Creatinine 0.76 - 1.27 mg/dL 1.37(H) - - - 1.38(H)   BP/Weight 06/23/2021 06/03/2021 02/23/2021 12/29/2020 11/22/2020 11/03/2020 75/09/2584  Systolic BP 277 824 235 361 443 154 008  Diastolic BP 88 84 96 87 80 93 98  Wt. (Lbs) 250 249.4 252 248.6 246.6 - 240  BMI 32.98 32.9 33.25 32.8 32.53 - 31.66  Some encounter information is confidential and restricted. Go to Review Flowsheets activity to see all data.   Foot/eye exam completion dates Latest Ref Rng & Units 10/08/2019 10/15/2018  Eye Exam No Retinopathy - No Retinopathy  Foot Form Completion - Done -      Updated lab needed at/ before next visit.

## 2021-06-23 NOTE — Telephone Encounter (Signed)
Patient called no prescription was sent into [facility] from his visit this morning.  methylPREDNISolone acetate (DEPO-Medrol)

## 2021-06-23 NOTE — Telephone Encounter (Signed)
Depo Medrol was the shot he was given in the office.

## 2021-06-23 NOTE — Telephone Encounter (Signed)
Pt informed

## 2021-06-24 ENCOUNTER — Encounter: Payer: Self-pay | Admitting: Family Medicine

## 2021-06-24 LAB — CMP14+EGFR
ALT: 93 IU/L — ABNORMAL HIGH (ref 0–44)
AST: 74 IU/L — ABNORMAL HIGH (ref 0–40)
Albumin/Globulin Ratio: 1.8 (ref 1.2–2.2)
Albumin: 4.4 g/dL (ref 3.7–4.7)
Alkaline Phosphatase: 63 IU/L (ref 44–121)
BUN/Creatinine Ratio: 8 — ABNORMAL LOW (ref 10–24)
BUN: 11 mg/dL (ref 8–27)
Bilirubin Total: 0.5 mg/dL (ref 0.0–1.2)
CO2: 20 mmol/L (ref 20–29)
Calcium: 9.8 mg/dL (ref 8.6–10.2)
Chloride: 95 mmol/L — ABNORMAL LOW (ref 96–106)
Creatinine, Ser: 1.3 mg/dL — ABNORMAL HIGH (ref 0.76–1.27)
Globulin, Total: 2.4 g/dL (ref 1.5–4.5)
Glucose: 232 mg/dL — ABNORMAL HIGH (ref 65–99)
Potassium: 4.2 mmol/L (ref 3.5–5.2)
Sodium: 131 mmol/L — ABNORMAL LOW (ref 134–144)
Total Protein: 6.8 g/dL (ref 6.0–8.5)
eGFR: 59 mL/min/{1.73_m2} — ABNORMAL LOW (ref >59)

## 2021-06-24 LAB — CBC WITH DIFFERENTIAL/PLATELET
Basophils Absolute: 0 10*3/uL (ref 0.0–0.2)
Basos: 1 %
EOS (ABSOLUTE): 0.4 10*3/uL (ref 0.0–0.4)
Eos: 6 %
Hematocrit: 41.7 % (ref 37.5–51.0)
Hemoglobin: 13.9 g/dL (ref 13.0–17.7)
Immature Grans (Abs): 0 10*3/uL (ref 0.0–0.1)
Immature Granulocytes: 0 %
Lymphocytes Absolute: 2.1 10*3/uL (ref 0.7–3.1)
Lymphs: 34 %
MCH: 29.2 pg (ref 26.6–33.0)
MCHC: 33.3 g/dL (ref 31.5–35.7)
MCV: 88 fL (ref 79–97)
Monocytes Absolute: 0.5 10*3/uL (ref 0.1–0.9)
Monocytes: 8 %
Neutrophils Absolute: 3.2 10*3/uL (ref 1.4–7.0)
Neutrophils: 51 %
Platelets: 194 10*3/uL (ref 150–450)
RBC: 4.76 x10E6/uL (ref 4.14–5.80)
RDW: 12.7 % (ref 11.6–15.4)
WBC: 6.1 10*3/uL (ref 3.4–10.8)

## 2021-06-24 LAB — VITAMIN D 25 HYDROXY (VIT D DEFICIENCY, FRACTURES): Vit D, 25-Hydroxy: 41.5 ng/mL (ref 30.0–100.0)

## 2021-06-24 LAB — HEMOGLOBIN A1C
Est. average glucose Bld gHb Est-mCnc: 209 mg/dL
Hgb A1c MFr Bld: 8.9 % — ABNORMAL HIGH (ref 4.8–5.6)

## 2021-06-24 LAB — TSH: TSH: 1.05 u[IU]/mL (ref 0.450–4.500)

## 2021-06-25 MED ORDER — GLIPIZIDE ER 10 MG PO TB24: ORAL_TABLET | ORAL | 5 refills | Status: DC

## 2021-06-25 NOTE — Assessment & Plan Note (Signed)
Hyperlipidemia:Low fat diet discussed and encouraged.   Lipid Panel  Lab Results  Component Value Date   CHOL 94 (L) 01/25/2021   HDL 39 (L) 01/25/2021   LDLCALC 28 01/25/2021   TRIG 163 (H) 01/25/2021   CHOLHDL 2.4 01/25/2021     Needs to lower fat intake , stay on zetia only, statin contraindicated, muscle pain and increasing liver enzymes

## 2021-06-25 NOTE — Assessment & Plan Note (Signed)
Generalized muscle aches , contraindication to statin therapy , also worsening LFT

## 2021-06-27 ENCOUNTER — Other Ambulatory Visit: Payer: Self-pay | Admitting: Family Medicine

## 2021-06-27 ENCOUNTER — Telehealth: Payer: Self-pay

## 2021-06-27 MED ORDER — BUSPIRONE HCL 7.5 MG PO TABS: 7.5000 mg | ORAL_TABLET | Freq: Two times a day (BID) | ORAL | 5 refills | Status: DC

## 2021-06-27 NOTE — Telephone Encounter (Signed)
States it needs to be 2 daily but its on his list for once daily. Please advise

## 2021-06-27 NOTE — Telephone Encounter (Signed)
Patient called needs med refill and needs to be changed to 2 tablets a day instead of 1 tablet a day.   busPIRone (BUSPAR) 7.5 MG tablet  Pharmacy: [facility]

## 2021-06-28 ENCOUNTER — Encounter: Payer: Self-pay | Admitting: Gastroenterology

## 2021-06-28 ENCOUNTER — Ambulatory Visit (INDEPENDENT_AMBULATORY_CARE_PROVIDER_SITE_OTHER): Payer: Medicare Other | Admitting: Gastroenterology

## 2021-06-28 ENCOUNTER — Other Ambulatory Visit: Payer: Self-pay

## 2021-06-28 ENCOUNTER — Ambulatory Visit: Payer: Medicaid Other | Admitting: Nurse Practitioner

## 2021-06-28 VITALS — BP 144/90 | HR 107 | Temp 96.8°F | Ht 73.0 in | Wt 245.6 lb

## 2021-06-28 DIAGNOSIS — R945 Abnormal results of liver function studies: Secondary | ICD-10-CM | POA: Diagnosis not present

## 2021-06-28 DIAGNOSIS — R7989 Other specified abnormal findings of blood chemistry: Secondary | ICD-10-CM | POA: Insufficient documentation

## 2021-06-28 DIAGNOSIS — A048 Other specified bacterial intestinal infections: Secondary | ICD-10-CM | POA: Insufficient documentation

## 2021-06-28 DIAGNOSIS — K219 Gastro-esophageal reflux disease without esophagitis: Secondary | ICD-10-CM | POA: Diagnosis not present

## 2021-06-28 NOTE — Progress Notes (Signed)
Primary Care Physician: Fayrene Helper, MD  Primary Gastroenterologist:  Elon Alas. Abbey Chatters, DO   Chief Complaint  Patient presents with   Follow-up    Labwork at PCP showed elevated liver enzymes    HPI: Matthew Molina is a 70 y.o. male here for follow-up.  Patient last seen in January 2022.  History of GERD, abdominal pain, rectal bleeding (hospitalized January 2021 with possible ischemic colitis), diverticulitis, H. pylori treated initially with Prevpac and then Pylera eradication needs to be confirmed.  PCP concerned about elevated LFTs.  Labs from June 23, 2021: Glucose 232, creatinine 1.30, sodium 131, total bilirubin 0.5, alkaline phosphatase 63, AST 74, ALT 93.  LFTs have been consistently elevated since February 2022.  They were normal in June 2021.  A1c 8.9.  CBC normal.  Overall feels good. Only occasionally has heartburn or vague stomach pain. BM fairly regular. No melena, brbpr. Consumes 2-3 cans of beer daily (about 36 ounces). Started not too long ago.  Has been drinking regularly for few months.  States he never had an issue with alcohol.  He was able to stop for 1 week but when I suggested that he stop for several weeks to see if that would affect his labs, he said he did not feel like he could do it.  Just stopped statin.  Diabetes medications being adjusted, dose increased due to poorly controlled diabetes.  Patient trying to improve diet.  Not physically active, walks with a cane.  Right upper quadrant ultrasound March 2022: IMPRESSION: Mild increased echogenicity in the liver without focal mass is nonspecific but often due to hepatic steatosis. No other abnormalities are identified.  Colonoscopy completed 04/01/2020 which found 7 total colon polyps, diverticulosis in the entire colon, external and internal hemorrhoids.  Surgical pathology found the polyps to be tubular adenoma.  Recommended repeat colonoscopy in 3 years (2024).  Patient requests the "big jug"  prep for his next colonoscopy.  Current Outpatient Medications  Medication Sig Dispense Refill   ACCU-CHEK GUIDE test strip USE AS DIRECTED TO CHECK BLOOD SUGAR ONCE DAILY. 50 strip 0   Accu-Chek Softclix Lancets lancets TEST BLOOD SUGAR ONCE DAILY AS DIRECTED. 100 each 5   acetaminophen (TYLENOL) 500 MG tablet Take 500-1,000 mg by mouth as needed for headache. Takes mostly for HA about 2 x/week.     albuterol (VENTOLIN HFA) 108 (90 Base) MCG/ACT inhaler Inhale 2 puffs into the lungs every 6 (six) hours as needed for wheezing or shortness of breath. 8.5 g 5   blood glucose meter kit and supplies Dispense based on patient and insurance preference.Use to check blood sugar once daily (FOR ICD-10 E10.9, E11.9). 1 each 0   Blood Glucose Monitoring Suppl (ACCU-CHEK AVIVA PLUS) w/Device KIT accucheck aviva meter Once daily testing DX e11.9 1 kit 0   budesonide-formoterol (SYMBICORT) 160-4.5 MCG/ACT inhaler Inhale 2 puffs into the lungs 2 (two) times daily. 1 each 3   busPIRone (BUSPAR) 7.5 MG tablet Take 1 tablet (7.5 mg total) by mouth 2 (two) times daily. 60 tablet 5   docusate sodium (COLACE) 100 MG capsule Take 100 mg by mouth daily as needed for mild constipation.      ezetimibe (ZETIA) 10 MG tablet TAKE 1 TABLET BY MOUTH ONCE DAILY. 90 tablet 0   glipiZIDE (GLUCOTROL XL) 10 MG 24 hr tablet Take two tablets by mouth every morning at breakfast 60 tablet 5   ipratropium-albuterol (DUONEB) 0.5-2.5 (3) MG/3ML SOLN USE 1  VIAL VIA NEBULIZER EVERY 4-6 HOURS. 540 mL 0   meloxicam (MOBIC) 7.5 MG tablet TAKE 1 TABLET BY MOUTH ONCE DAILY. 30 tablet 2   olmesartan (BENICAR) 40 MG tablet TAKE ONE TABLET BY MOUTH DAILY 90 tablet 0   pantoprazole (PROTONIX) 40 MG tablet TAKE ONE TABLET BY MOUTHONCE DAILY. 30 tablet 11   risperiDONE (RISPERDAL) 1 MG tablet TAKE (1) TABLET BY MOUTH AT BEDTIME WITH $RemoveBefo'4MG'uANHPMJnggZ$ . 90 tablet 0   risperidone (RISPERDAL) 4 MG tablet TAKE ONE TABLET BY MOUTH IN THE EVENING. TAKE WITH $RemoveB'1MG'uEPAWwCl$  TABLET.  90 tablet 0   sucralfate (CARAFATE) 1 g tablet Take 1 tablet (1 g total) by mouth 4 (four) times daily as needed (Acid reflux/abdominal pain/burning). 30 tablet 0   tamsulosin (FLOMAX) 0.4 MG CAPS capsule Take 1 capsule (0.4 mg total) by mouth daily after supper. 90 capsule 3   traZODone (DESYREL) 50 MG tablet TAKE ONE TABLET BY MOUTH AT BEDTIME AS NEEDED FOR SLEEP. 90 tablet 0   triamterene-hydrochlorothiazide (MAXZIDE-25) 37.5-25 MG tablet TAKE (1/2) TABLET BY MOUTH ONCE DAILY. 45 tablet 1   UNABLE TO FIND Nebulizer mouthpiece and tubing x 1  DX COPD 1 each 0   UNABLE TO FIND XL pullups for daily use DX Incontinence 100 each 11   UNABLE TO FIND Nebulizer machine Mask and tubing x 1  DX J44.9 1 each 0   No current facility-administered medications for this visit.    Allergies as of 06/28/2021 - Review Complete 06/28/2021  Allergen Reaction Noted   Sertraline hcl  04/17/2011   Wellbutrin [bupropion] Other (See Comments) 07/29/2015    ROS:  General: Negative for anorexia, weight loss, fever, chills, fatigue, weakness. ENT: Negative for hoarseness, difficulty swallowing , nasal congestion. CV: Negative for chest pain, angina, palpitations, dyspnea on exertion, peripheral edema.  Respiratory: Negative for dyspnea at rest, dyspnea on exertion, cough, sputum, wheezing.  GI: See history of present illness. GU:  Negative for dysuria, hematuria, urinary incontinence, urinary frequency, nocturnal urination.  Endo: Negative for unusual weight change.    Physical Examination:   BP (!) 144/90   Pulse (!) 107   Temp (!) 96.8 F (36 C) (Temporal)   Ht $R'6\' 1"'lJ$  (1.854 m)   Wt 245 lb 9.6 oz (111.4 kg)   BMI 32.40 kg/m   General: Well-nourished, well-developed in no acute distress.  Eyes: No icterus. Mouth: masked. Lungs: Clear to auscultation bilaterally.  Heart: Regular rate and rhythm, no murmurs rubs or gallops.  Abdomen: Bowel sounds are normal, nontender, nondistended, no  hepatosplenomegaly or masses, no abdominal bruits or hernia , no rebound or guarding.   Extremities: No lower extremity edema. No clubbing or deformities. Neuro: Alert and oriented x 4   Skin: Warm and dry, no jaundice.   Psych: Alert and cooperative, normal mood and affect.  Labs: See hpi  Imaging Studies: No results found.   Assessment/plan:  Normal LFTs: Has had minimally elevated ALT off and on in the past.  LFTs consistently elevated since February, have been normal in June 2021.  AST most recently 74, ALT 93.  Ultrasound with fatty liver.  He notes that he has been drinking 36 ounces of beer daily for several months.  Diabetes is poorly controlled.  Statin just discontinued yesterday.  Likely multifactorial.  Check viral hepatitis, screen for hemochromatosis, autoimmune process.  Encouraged discontinuing EtOH.  Encouraged him to improve his diet for better diabetes management.  Handouts provided.  History of H. pylori: Initially treated with Prevpac.  Breath test positive retreated with Pylera.  Hold PPI for 2 weeks.  Check H. pylori stool antigen.  GERD: Well-controlled on current regimen, pantoprazole daily.  Return to the office in 6 months or call sooner if needed.

## 2021-06-28 NOTE — Patient Instructions (Addendum)
We need to confirm that the infection (H.pylori) in your stomach was adequately treated. You need to HOLD pantoprazole for two weeks prior to collecting stool specimen. Once you collect stool, RESTART pantoprazole.  Please have lab work done at any time. Recommend you cutting back on alcohol use, goal of stopping.  You are at risk of developing permanent scarring of the liver, cirrhosis. Follow Dr. Griffin Dakin recommendations for your diet.  Currently her diabetes is poorly controlled and this will also contribute to fatty liver. Fatty Liver Disease  The liver converts food into energy, removes toxic material from the blood, makes important proteins, and absorbs necessary vitamins from food. Fatty liver disease occurs when too much fat has built up in your liver cells. Fatty liverdisease is also called hepatic steatosis. In many cases, fatty liver disease does not cause symptoms or problems. It is often diagnosed when tests are being done for other reasons. However, over time, fatty liver can cause inflammation that may lead to more serious liver problems, such as scarring of the liver (cirrhosis) and liver failure. Fatty liver is associated with insulin resistance, increased body fat, high blood pressure (hypertension), and high cholesterol. These are features of metabolic syndrome and increaseyour risk for stroke, diabetes, and heart disease. What are the causes? This condition may be caused by components of metabolic syndrome: Obesity. Insulin resistance. High cholesterol. Other causes: Alcohol abuse. Poor nutrition. Cushing syndrome. Pregnancy. Certain drugs. Poisons. Some viral infections. What increases the risk? You are more likely to develop this condition if you: Abuse alcohol. Are overweight. Have diabetes. Have hepatitis. Have a high triglyceride level. Are pregnant. What are the signs or symptoms? Fatty liver disease often does not cause symptoms. If symptoms do develop, they  can include: Fatigue and weakness. Weight loss. Confusion. Nausea, vomiting, or abdominal pain. Yellowing of your skin and the white parts of your eyes (jaundice). Itchy skin. How is this diagnosed? This condition may be diagnosed by: A physical exam and your medical history. Blood tests. Imaging tests, such as an ultrasound, CT scan, or MRI. A liver biopsy. A small sample of liver tissue is removed using a needle. The sample is then looked at under a microscope. How is this treated? Fatty liver disease is often caused by other health conditions. Treatment for fatty liver may involve medicines and lifestyle changes to manage conditions such as: Alcoholism. High cholesterol. Diabetes. Being overweight or obese. Follow these instructions at home:  Do not drink alcohol. If you have trouble quitting, ask your health care provider how to safely quit with the help of medicine or a supervised program. This is important to keep your condition from getting worse. Eat a healthy diet as told by your health care provider. Ask your health care provider about working with a dietitian to develop an eating plan. Exercise regularly. This can help you lose weight and control your cholesterol and diabetes. Talk to your health care provider about an exercise plan and which activities are best for you. Take over-the-counter and prescription medicines only as told by your health care provider. Keep all follow-up visits. This is important. Contact a health care provider if: You have trouble controlling your: Blood sugar. This is especially important if you have diabetes. Cholesterol. Drinking of alcohol. Get help right away if: You have abdominal pain. You have jaundice. You have nausea and are vomiting. You vomit blood or material that looks like coffee grounds. You have stools that are black, tar-like, or bloody. Summary Fatty liver  disease develops when too much fat builds up in the cells of your  liver. Fatty liver disease often causes no symptoms or problems. However, over time, fatty liver can cause inflammation that may lead to more serious liver problems, such as scarring of the liver (cirrhosis). You are more likely to develop this condition if you abuse alcohol, are pregnant, are overweight, have diabetes, have hepatitis, or have high triglyceride or cholesterol levels. Contact your health care provider if you have trouble controlling your blood sugar, cholesterol, or drinking of alcohol. This information is not intended to replace advice given to you by your health care provider. Make sure you discuss any questions you have with your healthcare provider. Document Revised: 09/16/2020 Document Reviewed: 09/16/2020 Elsevier Patient Education  [place].

## 2021-07-01 DIAGNOSIS — J449 Chronic obstructive pulmonary disease, unspecified: Secondary | ICD-10-CM | POA: Diagnosis not present

## 2021-07-04 ENCOUNTER — Other Ambulatory Visit: Payer: Self-pay | Admitting: Family Medicine

## 2021-07-05 ENCOUNTER — Other Ambulatory Visit: Payer: Self-pay

## 2021-07-05 ENCOUNTER — Ambulatory Visit: Payer: Medicare Other

## 2021-07-05 LAB — HM DIABETES EYE EXAM

## 2021-07-06 DIAGNOSIS — J42 Unspecified chronic bronchitis: Secondary | ICD-10-CM | POA: Diagnosis not present

## 2021-07-06 DIAGNOSIS — J441 Chronic obstructive pulmonary disease with (acute) exacerbation: Secondary | ICD-10-CM | POA: Diagnosis not present

## 2021-07-06 DIAGNOSIS — J449 Chronic obstructive pulmonary disease, unspecified: Secondary | ICD-10-CM | POA: Diagnosis not present

## 2021-07-08 DIAGNOSIS — R945 Abnormal results of liver function studies: Secondary | ICD-10-CM | POA: Diagnosis not present

## 2021-07-08 DIAGNOSIS — K219 Gastro-esophageal reflux disease without esophagitis: Secondary | ICD-10-CM | POA: Diagnosis not present

## 2021-07-08 DIAGNOSIS — A048 Other specified bacterial intestinal infections: Secondary | ICD-10-CM | POA: Diagnosis not present

## 2021-07-11 DIAGNOSIS — K219 Gastro-esophageal reflux disease without esophagitis: Secondary | ICD-10-CM | POA: Diagnosis not present

## 2021-07-11 DIAGNOSIS — R945 Abnormal results of liver function studies: Secondary | ICD-10-CM | POA: Diagnosis not present

## 2021-07-11 DIAGNOSIS — A048 Other specified bacterial intestinal infections: Secondary | ICD-10-CM | POA: Diagnosis not present

## 2021-07-13 LAB — H. PYLORI ANTIGEN, STOOL: H pylori Ag, Stl: NEGATIVE

## 2021-07-14 ENCOUNTER — Other Ambulatory Visit: Payer: Self-pay | Admitting: Internal Medicine

## 2021-07-14 ENCOUNTER — Other Ambulatory Visit: Payer: Self-pay | Admitting: Gastroenterology

## 2021-07-14 DIAGNOSIS — K922 Gastrointestinal hemorrhage, unspecified: Secondary | ICD-10-CM

## 2021-07-14 DIAGNOSIS — K219 Gastro-esophageal reflux disease without esophagitis: Secondary | ICD-10-CM

## 2021-07-14 DIAGNOSIS — R1084 Generalized abdominal pain: Secondary | ICD-10-CM

## 2021-07-14 DIAGNOSIS — K649 Unspecified hemorrhoids: Secondary | ICD-10-CM

## 2021-07-18 ENCOUNTER — Telehealth: Payer: Self-pay

## 2021-07-18 LAB — MITOCHONDRIAL/SMOOTH MUSCLE AB PNL
Mitochondrial Ab: <20 Units (ref 0.0–20.0)
Smooth Muscle Ab: 4 Units (ref 0–19)

## 2021-07-18 LAB — HEPATIC FUNCTION PANEL
ALT: 64 IU/L — ABNORMAL HIGH (ref 0–44)
AST: 44 IU/L — ABNORMAL HIGH (ref 0–40)
Albumin: 4.7 g/dL (ref 3.7–4.7)
Alkaline Phosphatase: 59 IU/L (ref 44–121)
Bilirubin Total: 0.6 mg/dL (ref 0.0–1.2)
Bilirubin, Direct: 0.18 mg/dL (ref 0.00–0.40)
Total Protein: 7.5 g/dL (ref 6.0–8.5)

## 2021-07-18 LAB — IGG, IGA, IGM
IgA/Immunoglobulin A, Serum: 193 mg/dL (ref 61–437)
IgG (Immunoglobin G), Serum: 1344 mg/dL (ref 603–1613)
IgM (Immunoglobulin M), Srm: 81 mg/dL (ref 15–143)

## 2021-07-18 LAB — IRON,TIBC AND FERRITIN PANEL
Ferritin: 269 ng/mL (ref 30–400)
Iron Saturation: 52 % (ref 15–55)
Iron: 132 ug/dL (ref 38–169)
Total Iron Binding Capacity: 252 ug/dL (ref 250–450)
UIBC: 120 ug/dL (ref 111–343)

## 2021-07-18 LAB — HEPATITIS C ANTIBODY: Hep C Virus Ab: 0.1 s/co ratio (ref 0.0–0.9)

## 2021-07-18 LAB — HEPATITIS B SURFACE ANTIGEN: Hepatitis B Surface Ag: NEGATIVE

## 2021-07-18 LAB — ANA: ANA Titer 1: NEGATIVE

## 2021-07-18 NOTE — Telephone Encounter (Signed)
Med refilled.

## 2021-07-18 NOTE — Telephone Encounter (Signed)
Patient needing refills sent to [facility] for pantoprazole ph# [phone]

## 2021-07-25 ENCOUNTER — Other Ambulatory Visit: Payer: Self-pay | Admitting: Family Medicine

## 2021-07-26 ENCOUNTER — Other Ambulatory Visit: Payer: Self-pay | Admitting: Family Medicine

## 2021-07-26 DIAGNOSIS — F209 Schizophrenia, unspecified: Secondary | ICD-10-CM

## 2021-07-27 ENCOUNTER — Telehealth: Payer: Self-pay | Admitting: Internal Medicine

## 2021-07-27 NOTE — Telephone Encounter (Signed)
[phone] PLEASE CALL PATIENT ABOUT HIS LAB RESULTS, HE SAID THEY TOOK THEM A FEW WEEKS AGO AND HE HAS NOT HEARD RESULTS

## 2021-07-28 ENCOUNTER — Other Ambulatory Visit: Payer: Self-pay

## 2021-07-28 DIAGNOSIS — R945 Abnormal results of liver function studies: Secondary | ICD-10-CM

## 2021-07-28 DIAGNOSIS — R7989 Other specified abnormal findings of blood chemistry: Secondary | ICD-10-CM

## 2021-07-29 ENCOUNTER — Ambulatory Visit: Payer: Medicare Other | Admitting: Urology

## 2021-08-01 DIAGNOSIS — J449 Chronic obstructive pulmonary disease, unspecified: Secondary | ICD-10-CM | POA: Diagnosis not present

## 2021-08-03 ENCOUNTER — Ambulatory Visit (INDEPENDENT_AMBULATORY_CARE_PROVIDER_SITE_OTHER): Payer: Medicare Other | Admitting: Podiatry

## 2021-08-03 ENCOUNTER — Other Ambulatory Visit: Payer: Self-pay

## 2021-08-03 ENCOUNTER — Encounter: Payer: Self-pay | Admitting: Podiatry

## 2021-08-03 DIAGNOSIS — M2142 Flat foot [pes planus] (acquired), left foot: Secondary | ICD-10-CM | POA: Diagnosis not present

## 2021-08-03 DIAGNOSIS — B351 Tinea unguium: Secondary | ICD-10-CM | POA: Diagnosis not present

## 2021-08-03 DIAGNOSIS — M79676 Pain in unspecified toe(s): Secondary | ICD-10-CM | POA: Diagnosis not present

## 2021-08-03 DIAGNOSIS — M2141 Flat foot [pes planus] (acquired), right foot: Secondary | ICD-10-CM

## 2021-08-03 DIAGNOSIS — Q828 Other specified congenital malformations of skin: Secondary | ICD-10-CM

## 2021-08-03 DIAGNOSIS — M2140 Flat foot [pes planus] (acquired), unspecified foot: Secondary | ICD-10-CM | POA: Insufficient documentation

## 2021-08-03 DIAGNOSIS — E1140 Type 2 diabetes mellitus with diabetic neuropathy, unspecified: Secondary | ICD-10-CM

## 2021-08-03 NOTE — Progress Notes (Signed)
This patient returns to my office for at risk foot care.  This patient requires this care by a professional since this patient will be at risk due to having diabetes mellitus and chronic kidney disease.  Patient has pain in the two calluses on the bottom outside of his feet.  This patient is unable to cut nails himself since the patient cannot reach his nails.These nails are painful walking and wearing shoes. He cannot treat the painful calluses himself. . This patient presents for at risk foot care today.  General Appearance  Alert, conversant and in no acute stress.  Vascular  Dorsalis pedis and posterior tibial  pulses are palpable  bilaterally.  Capillary return is within normal limits  bilaterally. Temperature is within normal limits  bilaterally.  Neurologic  Senn-Weinstein monofilament wire test diminished   bilaterally. Muscle power within normal limits bilaterally.  Nails Thick disfigured discolored nails with subungual debris  from hallux to fifth toes bilaterally. No evidence of bacterial infection or drainage bilaterally.  Orthopedic  No limitations of motion  feet .  No crepitus or effusions noted.  No bony pathology or digital deformities noted.  Skin  normotropic skin  noted bilaterally.  No signs of infections or ulcers noted.   Porokeratosis sub 5th metabase  B/L.  Onychomycosis  Pain in right toes  Pain in left toes  Porokeratosis  X 2.  Consent was obtained for treatment procedures.   Mechanical debridement of nails 1-5  bilaterally performed with a nail nipper.  Filed with dremel without incident. Debridement of porokeratosis  with # 15 blade.  Patient has foot cramps and discussed wearing powerstep insoles or good support in his shoes.  Could be diabetes related.   Return office visit   3 months                  Told patient to return for periodic foot care and evaluation due to potential at risk complications.   Gardiner Barefoot DPM

## 2021-08-06 DIAGNOSIS — J441 Chronic obstructive pulmonary disease with (acute) exacerbation: Secondary | ICD-10-CM | POA: Diagnosis not present

## 2021-08-06 DIAGNOSIS — J449 Chronic obstructive pulmonary disease, unspecified: Secondary | ICD-10-CM | POA: Diagnosis not present

## 2021-08-06 DIAGNOSIS — J42 Unspecified chronic bronchitis: Secondary | ICD-10-CM | POA: Diagnosis not present

## 2021-08-08 ENCOUNTER — Other Ambulatory Visit: Payer: Self-pay | Admitting: Family Medicine

## 2021-08-11 ENCOUNTER — Telehealth: Payer: Self-pay

## 2021-08-11 NOTE — Telephone Encounter (Signed)
Patient calling states taking 2 pills of the glipizide in the mornings and it has upset his stomach hes afraid to continue taking two ph# [phone]

## 2021-08-12 NOTE — Telephone Encounter (Signed)
Pt aware.

## 2021-08-16 ENCOUNTER — Other Ambulatory Visit: Payer: Self-pay | Admitting: Family Medicine

## 2021-09-01 ENCOUNTER — Ambulatory Visit (INDEPENDENT_AMBULATORY_CARE_PROVIDER_SITE_OTHER): Payer: Medicare Other | Admitting: Family Medicine

## 2021-09-01 ENCOUNTER — Other Ambulatory Visit: Payer: Self-pay

## 2021-09-01 ENCOUNTER — Encounter: Payer: Self-pay | Admitting: Family Medicine

## 2021-09-01 VITALS — HR 98

## 2021-09-01 DIAGNOSIS — Z720 Tobacco use: Secondary | ICD-10-CM

## 2021-09-01 DIAGNOSIS — J42 Unspecified chronic bronchitis: Secondary | ICD-10-CM | POA: Diagnosis not present

## 2021-09-01 DIAGNOSIS — J449 Chronic obstructive pulmonary disease, unspecified: Secondary | ICD-10-CM | POA: Diagnosis not present

## 2021-09-01 DIAGNOSIS — F17210 Nicotine dependence, cigarettes, uncomplicated: Secondary | ICD-10-CM | POA: Diagnosis not present

## 2021-09-01 MED ORDER — PREDNISONE 10 MG (21) PO TBPK: ORAL_TABLET | ORAL | 0 refills | Status: DC

## 2021-09-01 NOTE — Assessment & Plan Note (Signed)
Acute flare, prednisone dose pack prescribed

## 2021-09-01 NOTE — Patient Instructions (Signed)
F/U as before, call if you need me sooner  You are treated for a  flare up of your cOPD, prednisone dose pack is prescribed  Thanks for choosing [facility], we consider it a privelige to serve you.

## 2021-09-03 ENCOUNTER — Emergency Department (HOSPITAL_COMMUNITY)
Admission: EM | Admit: 2021-09-03 | Discharge: 2021-09-03 | Disposition: A | Discharge status: Home / Self Care | Payer: Medicare Other | Attending: Emergency Medicine | Admitting: Emergency Medicine

## 2021-09-03 ENCOUNTER — Encounter (HOSPITAL_COMMUNITY): Payer: Self-pay | Admitting: Emergency Medicine

## 2021-09-03 ENCOUNTER — Other Ambulatory Visit: Payer: Self-pay

## 2021-09-03 ENCOUNTER — Emergency Department (HOSPITAL_COMMUNITY): Payer: Medicare Other

## 2021-09-03 DIAGNOSIS — Z7951 Long term (current) use of inhaled steroids: Secondary | ICD-10-CM | POA: Insufficient documentation

## 2021-09-03 DIAGNOSIS — R000 Tachycardia, unspecified: Secondary | ICD-10-CM | POA: Diagnosis not present

## 2021-09-03 DIAGNOSIS — R069 Unspecified abnormalities of breathing: Secondary | ICD-10-CM | POA: Diagnosis not present

## 2021-09-03 DIAGNOSIS — E1122 Type 2 diabetes mellitus with diabetic chronic kidney disease: Secondary | ICD-10-CM | POA: Diagnosis not present

## 2021-09-03 DIAGNOSIS — N183 Chronic kidney disease, stage 3 unspecified: Secondary | ICD-10-CM | POA: Insufficient documentation

## 2021-09-03 DIAGNOSIS — Z20822 Contact with and (suspected) exposure to covid-19: Secondary | ICD-10-CM | POA: Diagnosis not present

## 2021-09-03 DIAGNOSIS — Z7984 Long term (current) use of oral hypoglycemic drugs: Secondary | ICD-10-CM | POA: Diagnosis not present

## 2021-09-03 DIAGNOSIS — I129 Hypertensive chronic kidney disease with stage 1 through stage 4 chronic kidney disease, or unspecified chronic kidney disease: Secondary | ICD-10-CM | POA: Diagnosis not present

## 2021-09-03 DIAGNOSIS — F17210 Nicotine dependence, cigarettes, uncomplicated: Secondary | ICD-10-CM | POA: Insufficient documentation

## 2021-09-03 DIAGNOSIS — Z79899 Other long term (current) drug therapy: Secondary | ICD-10-CM | POA: Diagnosis not present

## 2021-09-03 DIAGNOSIS — R059 Cough, unspecified: Secondary | ICD-10-CM | POA: Diagnosis not present

## 2021-09-03 DIAGNOSIS — R0602 Shortness of breath: Secondary | ICD-10-CM | POA: Diagnosis present

## 2021-09-03 DIAGNOSIS — J441 Chronic obstructive pulmonary disease with (acute) exacerbation: Secondary | ICD-10-CM | POA: Diagnosis not present

## 2021-09-03 DIAGNOSIS — Z743 Need for continuous supervision: Secondary | ICD-10-CM | POA: Diagnosis not present

## 2021-09-03 LAB — CBC WITH DIFFERENTIAL/PLATELET
Abs Immature Granulocytes: 0.07 10*3/uL (ref 0.00–0.07)
Basophils Absolute: 0 10*3/uL (ref 0.0–0.1)
Basophils Relative: 0 %
Eosinophils Absolute: 0 10*3/uL (ref 0.0–0.5)
Eosinophils Relative: 0 %
HCT: 43 % (ref 39.0–52.0)
Hemoglobin: 14.1 g/dL (ref 13.0–17.0)
Immature Granulocytes: 1 %
Lymphocytes Relative: 12 %
Lymphs Abs: 1.2 10*3/uL (ref 0.7–4.0)
MCH: 30.3 pg (ref 26.0–34.0)
MCHC: 32.8 g/dL (ref 30.0–36.0)
MCV: 92.3 fL (ref 80.0–100.0)
Monocytes Absolute: 0.7 10*3/uL (ref 0.1–1.0)
Monocytes Relative: 6 %
Neutro Abs: 8.5 10*3/uL — ABNORMAL HIGH (ref 1.7–7.7)
Neutrophils Relative %: 81 %
Platelets: 225 10*3/uL (ref 150–400)
RBC: 4.66 MIL/uL (ref 4.22–5.81)
RDW: 14.1 % (ref 11.5–15.5)
WBC: 10.5 10*3/uL (ref 4.0–10.5)
nRBC: 0 % (ref 0.0–0.2)

## 2021-09-03 LAB — BASIC METABOLIC PANEL
Anion gap: 10 (ref 5–15)
BUN: 22 mg/dL (ref 8–23)
CO2: 22 mmol/L (ref 22–32)
Calcium: 10 mg/dL (ref 8.9–10.3)
Chloride: 100 mmol/L (ref 98–111)
Creatinine, Ser: 1.52 mg/dL — ABNORMAL HIGH (ref 0.61–1.24)
GFR, Estimated: 49 mL/min — ABNORMAL LOW (ref >60)
Glucose, Bld: 233 mg/dL — ABNORMAL HIGH (ref 70–99)
Potassium: 3.8 mmol/L (ref 3.5–5.1)
Sodium: 132 mmol/L — ABNORMAL LOW (ref 135–145)

## 2021-09-03 LAB — BLOOD GAS, VENOUS
Acid-Base Excess: 0.1 mmol/L (ref 0.0–2.0)
Bicarbonate: 23.6 mmol/L (ref 20.0–28.0)
FIO2: 21
O2 Saturation: 57.8 %
Patient temperature: 37
pCO2, Ven: 39.7 mmHg — ABNORMAL LOW (ref 44.0–60.0)
pH, Ven: 7.403 (ref 7.250–7.430)
pO2, Ven: 33.6 mmHg (ref 32.0–45.0)

## 2021-09-03 LAB — BRAIN NATRIURETIC PEPTIDE: B Natriuretic Peptide: 35 pg/mL (ref 0.0–100.0)

## 2021-09-03 LAB — RESP PANEL BY RT-PCR (FLU A&B, COVID) ARPGX2
Influenza A by PCR: NEGATIVE
Influenza B by PCR: NEGATIVE
SARS Coronavirus 2 by RT PCR: NEGATIVE

## 2021-09-03 MED ORDER — PREDNISONE 10 MG PO TABS: 50.0000 mg | ORAL_TABLET | Freq: Every day | ORAL | 0 refills | Status: DC

## 2021-09-03 MED ORDER — DOXYCYCLINE HYCLATE 100 MG PO TABS
100.0000 mg | ORAL_TABLET | Freq: Once | ORAL | Status: AC
  Administered 2021-09-03: 100 mg via ORAL
  Filled 2021-09-03: qty 1

## 2021-09-03 MED ORDER — METHYLPREDNISOLONE SODIUM SUCC 125 MG IJ SOLR
125.0000 mg | Freq: Once | INTRAMUSCULAR | Status: AC
  Administered 2021-09-03: 125 mg via INTRAVENOUS
  Filled 2021-09-03: qty 2

## 2021-09-03 MED ORDER — DOXYCYCLINE HYCLATE 100 MG PO CAPS: 100.0000 mg | ORAL_CAPSULE | Freq: Two times a day (BID) | ORAL | 0 refills | Status: DC

## 2021-09-03 MED ORDER — IPRATROPIUM BROMIDE 0.02 % IN SOLN
0.5000 mg | Freq: Once | RESPIRATORY_TRACT | Status: AC
  Administered 2021-09-03: 0.5 mg via RESPIRATORY_TRACT
  Filled 2021-09-03: qty 2.5

## 2021-09-03 MED ORDER — ALBUTEROL (5 MG/ML) CONTINUOUS INHALATION SOLN
10.0000 mg/h | INHALATION_SOLUTION | RESPIRATORY_TRACT | Status: DC
  Administered 2021-09-03: 10 mg/h via RESPIRATORY_TRACT
  Filled 2021-09-03: qty 20

## 2021-09-03 MED ORDER — ALBUTEROL SULFATE HFA 108 (90 BASE) MCG/ACT IN AERS
4.0000 | INHALATION_SPRAY | Freq: Once | RESPIRATORY_TRACT | Status: DC
  Filled 2021-09-03: qty 6.7

## 2021-09-03 NOTE — ED Provider Notes (Addendum)
[facility] EMERGENCY DEPARTMENT Provider Note   CSN: [id number] Arrival date & time: 09/03/21  0840     History Chief Complaint  Patient presents with   Shortness of Breath    Matthew Molina is a 70 y.o. male.  HPI     Pt comes in with cc of shortness of breath and cough.  He has hx of COPD, DM.  Cough is dry. He has chest pain with cough. Few days ago there was pesticide spray, and he thinks it triggered it. + associated exertional shortness of breath.  He has used inhalers and cough meds with minimal relief.  No history of any CAD/CHF.  Patient smokes 2 packs a day.  Currently not on any oxygen support.  Past Medical History:  Diagnosis Date   Acute blood loss anemia 01/05/2020   Acute kidney injury ([place])    Acute respiratory failure with hypoxia (HCC)    Arthritis    COPD (chronic obstructive pulmonary disease) ([place])    Depression    Diabetes mellitus    Diabetes mellitus without complication ([place])    Diverticulitis    Elevated PSA 10/01/2016   Encounter for support and coordination of transition of care 01/14/2020   Hypercholesterolemia    Hyperlipidemia    Hypertension    Insomnia 01/05/2016   Ischemic colitis ([place]) 01/05/2020   Multiple lung nodules on CT 04/02/2015   Nicotine addiction    Obesity    Oxygen deficiency    qhs   Schizophrenia ([place])     Patient Active Problem List   Diagnosis Date Noted   Pes planus 08/03/2021   Abnormal LFTs 06/28/2021   H. pylori infection 06/28/2021   Benign prostatic hyperplasia with urinary obstruction 10/11/2020   Emphysema lung ([place]) 09/19/2020   Tubular adenoma of colon 09/13/2020   Incomplete emptying of bladder 09/08/2020   Musculoskeletal leg pain, right 08/11/2020   Hip pain, bilateral 08/11/2020   Abdominal pain 03/18/2020   Hemorrhoids 03/18/2020   Muscle pain 01/22/2020   QT prolongation 01/14/2020   Chronic respiratory failure with hypoxia ([place]) 01/06/2020   Diverticulitis of colon    Urinary  incontinence 10/08/2019   Unsteady gait 04/18/2017   Elevated PSA 10/01/2016   GERD (gastroesophageal reflux disease) 08/24/2016   CKD (chronic kidney disease) stage 3, GFR 30-59 ml/min (HCC) 05/22/2016   COPD (chronic obstructive pulmonary disease) ([place]) 11/05/2015   Hypertension 11/05/2015   Tobacco abuse 07/24/2015   Coronary atherosclerosis of native coronary artery 04/02/2015   Multiple lung nodules on CT 04/02/2015   GAD (generalized anxiety disorder) 07/04/2012   Back pain with radiation 11/01/2011   Diabetes mellitus type 2 in obese ([place]) 04/17/2011   Hyperlipidemia 06/02/2008   Obesity (BMI 30.0-34.9) 06/02/2008   Schizophrenia, unspecified type ([place]) 06/02/2008    Past Surgical History:  Procedure Laterality Date   CATARACT EXTRACTION W/PHACO Left 11/20/2013   Procedure: CATARACT EXTRACTION PHACO AND INTRAOCULAR LENS PLACEMENT ([place]);  Surgeon: Tonny Branch, MD;  Location: [facility] ORS;  Service: Ophthalmology;  Laterality: Left;  CDE:10.26   CATARACT EXTRACTION W/PHACO Right 12/08/2013   Procedure: RIGHT EYE CATARACT EXTRACTION PHACO AND INTRAOCULAR LENS PLACEMENT ;  Surgeon: Tonny Branch, MD;  Location: [facility] OR[facility];  Service: Ophthalmology;  Laterality: Right;  CDE 12.38   COLONOSCOPY N/A 04/01/2013   pancolonic diverticulosis, redundant colon, large internal hemorrhoids.    COLONOSCOPY  2014   INCOMPLETE PREP IN R COLON   COLONOSCOPY N/A 04/01/2020   Procedure: COLONOSCOPY;  Surgeon: Barney Drain  L, MD;  Location: [facility] ENDO SUITE;  Service: Endoscopy;  Laterality: N/A;  8:45am   ESOPHAGOGASTRODUODENOSCOPY N/A 02/18/2016   stricture at GE junction, moderate erosive gastritis and mild non-erosive duodenitis. +H.pylori. Treated initially with Prevpac. Breath test to check for eradication was positive, and he was prescribed Pylera.    EYE SURGERY Left 11/2013   cataract extraction   POLYPECTOMY  04/01/2020   Procedure: POLYPECTOMY;  Surgeon: Danie Binder, MD;  Location: [facility] END[facility] SU[facility];   Service: Endoscopy;;   SAVORY DILATION N/A 02/18/2016   Procedure: SAVORY DILATION;  Surgeon: Danie Binder, MD;  Location: [facility] END[facility] SU[facility];  Service: Endoscopy;  Laterality: N/A;   SHOULDER SURGERY         Family History  Problem Relation Age of Onset   Diabetes Mother    Hypertension Mother    Stroke Mother    Diabetes Sister    Heart disease Sister    Kidney disease Father    Drug abuse Brother    Colon cancer Neg Hx    Colon polyps Neg Hx     Social History   Tobacco Use   Smoking status: Every Day    Packs/day: 2.00    Years: 48.00    Pack years: 96.00    Types: Cigarettes    Last attempt to quit: 05/02/2017    Years since quitting: 4.3   Smokeless tobacco: Never  Vaping Use   Vaping Use: Never used  Substance Use Topics   Alcohol use: Yes    Alcohol/week: 0.0 standard drinks    Comment: "little bit of beer"   Drug use: No    Home Medications Prior to Admission medications   Medication Sig Start Date End Date Taking? Authorizing Provider  doxycycline (VIBRAMYCIN) 100 MG capsule Take 1 capsule (100 mg total) by mouth 2 (two) times daily. 09/03/21  Yes Varney Biles, MD  predniSONE (DELTASONE) 10 MG tablet Take 5 tablets (50 mg total) by mouth daily. 09/04/21  Yes Varney Biles, MD  ACCU-CHEK GUIDE test strip USE AS DIRECTED TO CHECK BLOOD SUGAR ONCE DAILY. 04/22/21   Fayrene Helper, MD  Accu-Chek Softclix Lancets lancets TEST BLOOD SUGAR ONCE DAILY AS DIRECTED. 07/07/19   Fayrene Helper, MD  acetaminophen (TYLENOL) 500 MG tablet Take 500-1,000 mg by mouth as needed for headache. Takes mostly for HA about 2 x/week.    [provider]  albuterol (VENTOLIN HFA) 108 (90 Base) MCG/ACT inhaler Inhale 2 puffs into the lungs every 6 (six) hours as needed for wheezing or shortness of breath. 05/23/21   Fayrene Helper, MD  blood glucose meter kit and supplies Dispense based on patient and insurance preference.Use to check blood sugar once daily (FOR ICD-10  E10.9, E11.9). 10/15/20   Fayrene Helper, MD  Blood Glucose Monitoring Suppl (ACCU-CHEK AVIVA PLUS) w/Device KIT accucheck aviva meter Once daily testing DX e11.9 07/09/19   Fayrene Helper, MD  budesonide-formoterol [facility]) 160-4.5 MCG/ACT inhaler Inhale 2 puffs into the lungs 2 (two) times daily. 09/13/20   Fayrene Helper, MD  busPIRone (BUSPAR) 7.5 MG tablet Take 1 tablet (7.5 mg total) by mouth 2 (two) times daily. 06/27/21   Fayrene Helper, MD  docusate sodium (COLACE) 100 MG capsule Take 100 mg by mouth daily as needed for mild constipation.     [provider]  ezetimibe (ZETIA) 10 MG tablet TAKE 1 TABLET BY MOUTH ONCE DAILY. 08/16/21   Fayrene Helper, MD  glipiZIDE Faylene Kurtz  XL) 10 MG 24 hr tablet Take two tablets by mouth every morning at breakfast 06/27/21   Fayrene Helper, MD  ipratropium-albuterol (DUONEB) 0.5-2.5 (3) MG/3ML SOLN USE 1 VIAL VIA NEBULIZER EVERY 4-6 HOURS. 04/19/21   Fayrene Helper, MD  meloxicam (MOBIC) 7.5 MG tablet TAKE 1 TABLET BY MOUTH ONCE DAILY. 05/23/21   Fayrene Helper, MD  olmesartan (BENICAR) 40 MG tablet TAKE ONE TABLET BY MOUTH DAILY 06/27/21   Fayrene Helper, MD  pantoprazole (PROTONIX) 40 MG tablet TAKE ONE TABLET BY MOUTH ONCE DAILY. 07/14/21   Erenest Rasher, PA-C  risperiDONE (RISPERDAL) 1 MG tablet TAKE ONE TABLET BY MOUTH IN THE EVENING. TAKE WITH $RemoveB'4MG'VhfICGmk$  TABLET. 07/25/21   Fayrene Helper, MD  risperidone (RISPERDAL) 4 MG tablet TAKE ONE TABLET BY MOUTH IN THE EVENING. TAKE WITH $RemoveB'1MG'qvBEABIu$  TABLET. 07/26/21   Fayrene Helper, MD  sucralfate (CARAFATE) 1 g tablet TAKE 1 TABLET BY MOUTH FOUR TIMES A DAY AS NEEDED. 07/14/21   Lindell Spar, MD  tamsulosin (FLOMAX) 0.4 MG CAPS capsule Take 1 capsule (0.4 mg total) by mouth daily after supper. 10/11/20   McKenzie, Candee Furbish, MD  traZODone (DESYREL) 50 MG tablet TAKE ONE TABLET BY MOUTH AT BEDTIME AS NEEDED FOR SLEEP. 08/08/21   Fayrene Helper, MD   triamterene-hydrochlorothiazide (MAXZIDE-25) 37.5-25 MG tablet TAKE 1/2 TABLET BY MOUTH ONCE DAILY. 07/04/21   Fayrene Helper, MD  UNABLE TO FIND Nebulizer mouthpiece and tubing x 1  DX COPD 05/29/19   Fayrene Helper, MD  UNABLE TO FIND XL pullups for daily use DX Incontinence 09/21/20   Fayrene Helper, MD  UNABLE TO FIND Nebulizer machine Mask and tubing x 1  DX J44.9 09/27/20   Fayrene Helper, MD  sildenafil (VIAGRA) 100 MG tablet Take 100 mg by mouth. Take one tablet 30 mins before intercourse   03/11/12  [provider]    Allergies    Sertraline hcl and Wellbutrin [bupropion]  Review of Systems   Review of Systems  Constitutional:  Positive for activity change. Negative for chills and fever.  Respiratory:  Positive for cough and shortness of breath.   Cardiovascular:  Positive for chest pain.  Gastrointestinal:  Negative for nausea and vomiting.  Allergic/Immunologic: Negative for immunocompromised state.  Hematological:  Does not bruise/bleed easily.  All other systems reviewed and are negative.  Physical Exam Updated Vital Signs BP 124/75   Pulse (!) 121   Temp 98.3 F (36.8 C) (Oral)   Resp 19   Ht $R'6\' 1"'Zb$  (1.854 m)   Wt 108.9 kg   SpO2 96%   BMI 31.66 kg/m   Physical Exam Vitals and nursing note reviewed.  Constitutional:      Appearance: He is well-developed.  HENT:     Head: Atraumatic.  Cardiovascular:     Rate and Rhythm: Normal rate.  Pulmonary:     Effort: Pulmonary effort is normal. Tachypnea present. No accessory muscle usage.     Breath sounds: Wheezing present. No decreased breath sounds or rales.  Musculoskeletal:     Cervical back: Neck supple.     Right lower leg: No edema.     Left lower leg: No edema.  Skin:    General: Skin is warm.  Neurological:     Mental Status: He is alert and oriented to person, place, and time.    ED Results / Procedures / Treatments   Labs (all labs ordered are listed, but  only  abnormal results are displayed) Labs Reviewed  BASIC METABOLIC PANEL - Abnormal; Notable for the following components:      Result Value   Sodium 132 (*)    Glucose, Bld 233 (*)    Creatinine, Ser 1.52 (*)    GFR, Estimated 49 (*)    All other components within normal limits  CBC WITH DIFFERENTIAL/PLATELET - Abnormal; Notable for the following components:   Neutro Abs 8.5 (*)    All other components within normal limits  BLOOD GAS, VENOUS - Abnormal; Notable for the following components:   pCO2, Ven 39.7 (*)    All other components within normal limits  RESP PANEL BY RT-PCR (FLU A&B, COVID) ARPGX2  BRAIN NATRIURETIC PEPTIDE    EKG EKG Interpretation  Date/Time:  Saturday September 03 2021 08:51:15 EDT Ventricular Rate:  113 PR Interval:  167 QRS Duration: 91 QT Interval:  332 QTC Calculation: 456 R Axis:   -66 Text Interpretation: Sinus tachycardia Left anterior fascicular block Abnormal R-wave progression, late transition No acute changes No significant change since last tracing Confirmed by Varney Biles ([id number]) on 09/03/2021 11:29:34 AM  Radiology DG Chest Port 1 View  Result Date: 09/03/2021 CLINICAL DATA:  Cough. EXAM: PORTABLE CHEST 1 VIEW COMPARISON:  04/17/2019 FINDINGS: 0935 hours. The lungs are clear without focal pneumonia, edema, pneumothorax or pleural effusion. Interstitial markings are diffusely coarsened with chronic features. The cardio pericardial silhouette is enlarged. Probable atelectasis or infiltrate left lung base with juxta cardiac fat pad as noted on CT 11/12/2020. Telemetry leads overlie the chest. IMPRESSION: Chronic interstitial changes. Retrocardiac atelectasis or infiltrate with superimposed fat pad. Follow-up PA and lateral views of the chest could be used to further evaluate as clinically warranted. Electronically Signed   By: Misty Stanley M.D.   On: 09/03/2021 09:46    Procedures .Critical Care Performed by: Varney Biles, MD Authorized by:  Varney Biles, MD   Critical care provider statement:    Critical care time (minutes):  40   Critical care was necessary to treat or prevent imminent or life-threatening deterioration of the following conditions:  Respiratory failure   Smoking cessation instruction/counseling given:  commended patient for quitting and reviewed strategies for preventing relapses   Medications Ordered in ED Medications  albuterol (PROVENTIL,VENTOLIN) solution continuous neb (10 mg/hr Nebulization New Bag/Given 09/03/21 1121)  albuterol (VENTOLIN HFA) 108 (90 Base) MCG/ACT inhaler 4 puff (has no administration in time range)  doxycycline (VIBRA-TABS) tablet 100 mg (has no administration in time range)  methylPREDNISolone sodium succinate (SOLU-MEDROL) 125 mg/2 mL injection 125 mg (125 mg Intravenous Given 09/03/21 0948)  ipratropium (ATROVENT) nebulizer solution 0.5 mg (0.5 mg Nebulization Given 09/03/21 1121)    ED Course  I have reviewed the triage vital signs and the nursing notes.  Pertinent labs & imaging results that were available during my care of the patient were reviewed by me and considered in my medical decision making (see chart for details).  Clinical Course as of 09/03/21 1444  Sat Sep 03, 2021  1439 Repeat exam reveals improvement of wheezing in all lung fields - but not complete clarity. Patient is not in any respiratory distress nor is there hypoxia.  [AN]    Clinical Course User Index [AN] Varney Biles, MD   MDM Rules/Calculators/A&P                           Pt comes in w/ cc of cough and shortness  of breath.  Hx of COPD. Appears to be tachypneic and has some wheezing diffusely with poor aeration.  Patient admits to smoking about 2 packs a day.  DDx - COPD exacerbation, bacterial PNA, covid, PE, CHF.   Will get x-rays, basic labs and start respiratory treatment.  Patient will need reassessment.  At this time, tachypnea and diffuse wheezing, suspicion is that he will likely  need admission.  2:44 PM Chest x-ray is negative for any acute findings.  Patient was ambulated, O2 sats remained over 92%. He is comfortable with outpatient follow-up with PCP and strict ER return precautions discussed.  Patient is a heavy smoker, smoking cessation also recommended.  Final Clinical Impression(s) / ED Diagnoses Final diagnoses:  COPD exacerbation ([place])    Rx / DC Orders ED Discharge Orders          Ordered    predniSONE (DELTASONE) 10 MG tablet  Daily        09/03/21 1438    doxycycline (VIBRAMYCIN) 100 MG capsule  2 times daily        09/03/21 1438             Varney Biles, MD 09/03/21 1440    Varney Biles, MD 09/03/21 1444

## 2021-09-03 NOTE — ED Notes (Signed)
Pt ambulatory with cane, pt ambulatory without assistance, pt states that he is not feeling strong, but is doing ok.

## 2021-09-03 NOTE — ED Triage Notes (Signed)
Pt arrived by [facility]. Pt has been sob w/ a dry cough x2 days. Pt has hx of COPD.

## 2021-09-03 NOTE — Discharge Instructions (Addendum)
We saw you in the ER for your breathing related complains. We gave you some breathing treatments in the ER, and seems like your symptoms have improved. °Please take albuterol as needed every 4 hours. °Please take the medications prescribed. °Please refrain from smoking or smoke exposure. °Please see a primary care doctor in 1 week. °Return to the ER if your symptoms worsen. ° °

## 2021-09-03 NOTE — ED Notes (Signed)
MD aware of pts heart rate.

## 2021-09-05 ENCOUNTER — Other Ambulatory Visit: Payer: Medicare Other

## 2021-09-05 ENCOUNTER — Other Ambulatory Visit: Payer: Self-pay

## 2021-09-05 ENCOUNTER — Other Ambulatory Visit: Payer: Self-pay | Admitting: Family Medicine

## 2021-09-05 ENCOUNTER — Encounter: Payer: Self-pay | Admitting: Family Medicine

## 2021-09-05 DIAGNOSIS — R972 Elevated prostate specific antigen [PSA]: Secondary | ICD-10-CM

## 2021-09-05 DIAGNOSIS — M25551 Pain in right hip: Secondary | ICD-10-CM

## 2021-09-05 DIAGNOSIS — M25552 Pain in left hip: Secondary | ICD-10-CM

## 2021-09-05 DIAGNOSIS — E669 Obesity, unspecified: Secondary | ICD-10-CM

## 2021-09-05 DIAGNOSIS — E1169 Type 2 diabetes mellitus with other specified complication: Secondary | ICD-10-CM

## 2021-09-05 NOTE — Assessment & Plan Note (Signed)
Asked:confirms currently smokes cigarettes °Assess: Unwilling to set a quit date, but is cutting back °Advise: needs to QUIT to reduce risk of cancer, cardio and cerebrovascular disease °Assist: counseled for 5 minutes and literature provided °Arrange: follow up in 2 to 4 months ° °

## 2021-09-05 NOTE — Progress Notes (Signed)
Virtual Visit via Telephone Note  I connected with [name] on 09/05/21 at  3:40 PM EDT by telephone and verified that I am speaking with the correct person using two identifiers.  Location: Patient: home Provider: office   I discussed the limitations, risks, security and privacy concerns of performing an evaluation and management service by telephone and the availability of in person appointments. I also discussed with the patient that there may be a patient responsible charge related to this service. The patient expressed understanding and agreed to proceed.   History of Present Illness:   2 dy h/o dry cough and shortness of breath. Denies fever, chills , sinus pressure , nasal drainage, sore throat or ear pain Observations/Objective:   Assessment and Plan:  COPD (chronic obstructive pulmonary disease) (HCC) Acute flare, prednisone dose pack prescribed  Tobacco abuse Asked:confirms currently smokes cigarettes Assess: Unwilling to set a quit date, but is cutting back Advise: needs to QUIT to reduce risk of cancer, cardio and cerebrovascular disease Assist: counseled for 5 minutes and literature provided Arrange: follow up in 2 to 4 months  Follow Up Instructions:    I discussed the assessment and treatment plan with the patient. The patient was provided an opportunity to ask questions and all were answered. The patient agreed with the plan and demonstrated an understanding of the instructions.   The patient was advised to call back or seek an in-person evaluation if the symptoms worsen or if the condition fails to improve as anticipated.  I provided 11  minutes of non-face-to-face time during this encounter.   [name], MD

## 2021-09-06 DIAGNOSIS — H905 Unspecified sensorineural hearing loss: Secondary | ICD-10-CM | POA: Diagnosis not present

## 2021-09-06 DIAGNOSIS — J449 Chronic obstructive pulmonary disease, unspecified: Secondary | ICD-10-CM | POA: Diagnosis not present

## 2021-09-06 DIAGNOSIS — J441 Chronic obstructive pulmonary disease with (acute) exacerbation: Secondary | ICD-10-CM | POA: Diagnosis not present

## 2021-09-06 DIAGNOSIS — J42 Unspecified chronic bronchitis: Secondary | ICD-10-CM | POA: Diagnosis not present

## 2021-09-06 LAB — PSA: Prostate Specific Ag, Serum: 9.7 ng/mL — ABNORMAL HIGH (ref 0.0–4.0)

## 2021-09-12 ENCOUNTER — Ambulatory Visit (INDEPENDENT_AMBULATORY_CARE_PROVIDER_SITE_OTHER): Payer: Medicare Other | Admitting: Urology

## 2021-09-12 ENCOUNTER — Other Ambulatory Visit: Payer: Self-pay

## 2021-09-12 ENCOUNTER — Encounter: Payer: Self-pay | Admitting: Urology

## 2021-09-12 VITALS — BP 105/71 | HR 108

## 2021-09-12 DIAGNOSIS — N138 Other obstructive and reflux uropathy: Secondary | ICD-10-CM | POA: Diagnosis not present

## 2021-09-12 DIAGNOSIS — N401 Enlarged prostate with lower urinary tract symptoms: Secondary | ICD-10-CM | POA: Diagnosis not present

## 2021-09-12 DIAGNOSIS — R972 Elevated prostate specific antigen [PSA]: Secondary | ICD-10-CM

## 2021-09-12 DIAGNOSIS — R339 Retention of urine, unspecified: Secondary | ICD-10-CM | POA: Diagnosis not present

## 2021-09-12 LAB — BLADDER SCAN AMB NON-IMAGING: Scan Result: 119

## 2021-09-12 MED ORDER — TAMSULOSIN HCL 0.4 MG PO CAPS: 0.4000 mg | ORAL_CAPSULE | Freq: Every day | ORAL | 3 refills | Status: DC

## 2021-09-12 NOTE — Progress Notes (Signed)
post void residual=119  Urological Symptom Review  Patient is experiencing the following symptoms: Frequent urination Get up at night to urinate   Review of Systems  Gastrointestinal (upper)  : Indigestion/heartburn  Gastrointestinal (lower) : Diarrhea  Constitutional : Fatigue  Skin: Itching  Eyes: Negative for eye symptoms  Ear/Nose/Throat : Sinus problems  Hematologic/Lymphatic: Negative for Hematologic/Lymphatic symptoms  Cardiovascular : Negative for cardiovascular symptoms  Respiratory : Cough Shortness of breath  Endocrine: Excessive thirst  Musculoskeletal: Back pain Joint pain  Neurological: Negative for neurological symptoms  Psychologic: Depression Anxiety

## 2021-09-12 NOTE — Progress Notes (Signed)
09/12/2021 10:27 AM   Matthew Molina [birth year] [id number]  Referring provider: Fayrene Helper, MD [address] [place],  [place] [address]  Followup incomplete emptying   HPI: [name] is a 70yo here for followup for incomplete emptying. PVR 119cc.  IPSS 12, QOL 2. Urine stream strong. Nocturia 1-2x. No urinary hesitancy, no straining to urinate. PSA stable at 9.7. No dysuria or hematuria. No other complaints today   PMH: Past Medical History:  Diagnosis Date   Acute blood loss anemia 01/05/2020   Acute kidney injury ([place])    Acute respiratory failure with hypoxia (HCC)    Arthritis    COPD (chronic obstructive pulmonary disease) ([place])    Depression    Diabetes mellitus    Diabetes mellitus without complication ([place])    Diverticulitis    Elevated PSA 10/01/2016   Encounter for support and coordination of transition of care 01/14/2020   Hypercholesterolemia    Hyperlipidemia    Hypertension    Insomnia 01/05/2016   Ischemic colitis ([place]) 01/05/2020   Multiple lung nodules on CT 04/02/2015   Nicotine addiction    Obesity    Oxygen deficiency    qhs   Schizophrenia [facility])     Surgical History: Past Surgical History:  Procedure Laterality Date   CATARACT EXTRACTION W/PHACO Left 11/20/2013   Procedure: CATARACT EXTRACTION PHACO AND INTRAOCULAR LENS PLACEMENT ([place]);  Surgeon: Tonny Branch, MD;  Location: [facility] ORS;  Service: Ophthalmology;  Laterality: Left;  CDE:10.26   CATARACT EXTRACTION W/PHACO Right 12/08/2013   Procedure: RIGHT EYE CATARACT EXTRACTION PHACO AND INTRAOCULAR LENS PLACEMENT ;  Surgeon: Tonny Branch, MD;  Location: [facility];  Service: Ophthalmology;  Laterality: Right;  CDE 12.38   COLONOSCOPY N/A 04/01/2013   pancolonic diverticulosis, redundant colon, large internal hemorrhoids.    COLONOSCOPY  2014   INCOMPLETE PREP IN R COLON   COLONOSCOPY N/A 04/01/2020   Procedure: COLONOSCOPY;  Surgeon: Danie Binder, MD;  Location: [facility] SU[facility];   Service: Endoscopy;  Laterality: N/A;  8:45am   ESOPHAGOGASTRODUODENOSCOPY N/A 02/18/2016   stricture at GE junction, moderate erosive gastritis and mild non-erosive duodenitis. +H.pylori. Treated initially with Prevpac. Breath test to check for eradication was positive, and he was prescribed Pylera.    EYE SURGERY Left 11/2013   cataract extraction   POLYPECTOMY  04/01/2020   Procedure: POLYPECTOMY;  Surgeon: Danie Binder, MD;  Location: [facility] SU[facility];  Service: Endoscopy;;   SAVORY DILATION N/A 02/18/2016   Procedure: SAVORY DILATION;  Surgeon: Danie Binder, MD;  Location: [facility];  Service: Endoscopy;  Laterality: N/A;   SHOULDER SURGERY      Home Medications:  Allergies as of 09/12/2021       Reactions   Sertraline Hcl    Stomach upset/pain   Wellbutrin [bupropion] Other (See Comments)   Makes stomach hurt        Medication List        Accurate as of September 12, 2021 10:27 AM. If you have any questions, ask your nurse or doctor.          Accu-Chek Aviva Plus w/Device Kit accucheck aviva meter Once daily testing DX e11.9   Accu-Chek Guide test strip Generic drug: glucose blood USE AS DIRECTED TO CHECK BLOOD SUGAR ONCE DAILY.   Accu-Chek Softclix Lancets lancets TEST BLOOD SUGAR ONCE DAILY AS DIRECTED.   acetaminophen 500 MG tablet Commonly known as: TYLENOL Take 500-1,000 mg by mouth as needed  for headache. Takes mostly for HA about 2 x/week.   albuterol 108 (90 Base) MCG/ACT inhaler Commonly known as: VENTOLIN HFA Inhale 2 puffs into the lungs every 6 (six) hours as needed for wheezing or shortness of breath.   blood glucose meter kit and supplies Dispense based on patient and insurance preference.Use to check blood sugar once daily (FOR ICD-10 E10.9, E11.9).   budesonide-formoterol 160-4.5 MCG/ACT inhaler Commonly known as: SYMBICORT Inhale 2 puffs into the lungs 2 (two) times daily.   busPIRone 7.5 MG tablet Commonly known as: BUSPAR Take 1  tablet (7.5 mg total) by mouth 2 (two) times daily.   docusate sodium 100 MG capsule Commonly known as: COLACE Take 100 mg by mouth daily as needed for mild constipation.   doxycycline 100 MG capsule Commonly known as: VIBRAMYCIN Take 1 capsule (100 mg total) by mouth 2 (two) times daily.   ezetimibe 10 MG tablet Commonly known as: ZETIA TAKE 1 TABLET BY MOUTH ONCE DAILY.   glipiZIDE 10 MG 24 hr tablet Commonly known as: GLUCOTROL XL Take two tablets by mouth every morning at breakfast   ipratropium-albuterol 0.5-2.5 (3) MG/3ML Soln Commonly known as: DUONEB USE 1 VIAL VIA NEBULIZER EVERY 4-6 HOURS.   meloxicam 7.5 MG tablet Commonly known as: MOBIC TAKE 1 TABLET BY MOUTH ONCE DAILY.   olmesartan 40 MG tablet Commonly known as: BENICAR TAKE ONE TABLET BY MOUTH DAILY   pantoprazole 40 MG tablet Commonly known as: PROTONIX TAKE ONE TABLET BY MOUTH ONCE DAILY.   predniSONE 10 MG tablet Commonly known as: DELTASONE Take 5 tablets (50 mg total) by mouth daily.   risperiDONE 1 MG tablet Commonly known as: RISPERDAL TAKE ONE TABLET BY MOUTH IN THE EVENING. TAKE WITH 4MG TABLET.   risperidone 4 MG tablet Commonly known as: RISPERDAL TAKE ONE TABLET BY MOUTH IN THE EVENING. TAKE WITH 1MG TABLET.   sucralfate 1 g tablet Commonly known as: CARAFATE TAKE 1 TABLET BY MOUTH FOUR TIMES A DAY AS NEEDED.   tamsulosin 0.4 MG Caps capsule Commonly known as: FLOMAX Take 1 capsule (0.4 mg total) by mouth daily after supper.   traZODone 50 MG tablet Commonly known as: DESYREL TAKE ONE TABLET BY MOUTH AT BEDTIME AS NEEDED FOR SLEEP.   triamterene-hydrochlorothiazide 37.5-25 MG tablet Commonly known as: MAXZIDE-25 TAKE 1/2 TABLET BY MOUTH ONCE DAILY.   UNABLE TO FIND Nebulizer mouthpiece and tubing x 1  DX COPD   UNABLE TO FIND XL pullups for daily use DX Incontinence   UNABLE TO FIND Nebulizer machine Mask and tubing x 1  DX J44.9        Allergies:  Allergies   Allergen Reactions   Sertraline Hcl     Stomach upset/pain   Wellbutrin [Bupropion] Other (See Comments)    Makes stomach hurt    Family History: Family History  Problem Relation Age of Onset   Diabetes Mother    Hypertension Mother    Stroke Mother    Diabetes Sister    Heart disease Sister    Kidney disease Father    Drug abuse Brother    Colon cancer Neg Hx    Colon polyps Neg Hx     Social History:  reports that he has been smoking cigarettes. He has a 96.00 pack-year smoking history. He has never used smokeless tobacco. He reports current alcohol use. He reports that he does not use drugs.  ROS: All other review of systems were reviewed and are negative except what  is noted above in HPI  Physical Exam: BP 105/71   Pulse (!) 108   Constitutional:  Alert and oriented, No acute distress. HEENT: [place] AT, moist mucus membranes.  Trachea midline, no masses. Cardiovascular: No clubbing, cyanosis, or edema. Respiratory: Normal respiratory effort, no increased work of breathing. GI: Abdomen is soft, nontender, nondistended, no abdominal masses GU: No CVA tenderness.  Lymph: No cervical or inguinal lymphadenopathy. Skin: No rashes, bruises or suspicious lesions. Neurologic: Grossly intact, no focal deficits, moving all 4 extremities. Psychiatric: Normal mood and affect.  Laboratory Data: Lab Results  Component Value Date   WBC 10.5 09/03/2021   HGB 14.1 09/03/2021   HCT 43.0 09/03/2021   MCV 92.3 09/03/2021   PLT 225 09/03/2021    Lab Results  Component Value Date   CREATININE 1.52 (H) 09/03/2021    Lab Results  Component Value Date   PSA 9.3 (H) 05/26/2020   PSA 5.9 (H) 07/05/2017   PSA 4.96 (H) 06/05/2016    No results found for: TESTOSTERONE  Lab Results  Component Value Date   HGBA1C 8.9 (H) 06/23/2021    Urinalysis    Component Value Date/Time   COLORURINE lt. yellow 02/25/2009 1257   APPEARANCEUR Clear 09/08/2020 1206   LABSPEC 1.010  02/25/2009 1257   PHURINE 6.0 02/25/2009 1257   GLUCOSEU Negative 09/08/2020 1206   HGBUR negative 02/25/2009 1257   BILIRUBINUR Negative 09/08/2020 1206   PROTEINUR Negative 09/08/2020 1206   UROBILINOGEN 0.2 02/25/2009 1257   NITRITE Negative 09/08/2020 1206   NITRITE negative 02/25/2009 1257   LEUKOCYTESUR Negative 09/08/2020 1206    Lab Results  Component Value Date   LABMICR Comment 09/08/2020    Pertinent Imaging:  No results found for this or any previous visit.  No results found for this or any previous visit.  No results found for this or any previous visit.  No results found for this or any previous visit.  Results for orders placed during the hospital encounter of 07/20/14  US Renal  Narrative CLINICAL DATA:  Evaluate for renal lesions or cysts.  EXAM: RENAL/URINARY TRACT ULTRASOUND COMPLETE  COMPARISON:  Chest CT 07/14/2014  FINDINGS: Right Kidney:  Length: 16.0 cm. There are multiple hypoechoic and anechoic structures in the right kidney that are suggestive for renal cysts. No evidence for hydronephrosis. Index cyst in the mid right kidney measures 3.5 x 4.4 x 3.8 cm. This appears to be a simple cyst with acoustic enhancement. Anechoic cyst in the upper pole measures 4.0 x 3.7 x 3.7 cm. The lower pole appears to be lobulated with small cysts.  Left Kidney:  Length: 13.4 cm. Question at least one echogenic focus in the left kidney mid pole which could represent a nonobstructive stone. No evidence for hydronephrosis. Question a small cyst in the midpole region.  Bladder:  Normal appearance of the urinary bladder. Bilateral ureter jets are present.  IMPRESSION: Multiple right renal cysts. Largest right renal cyst measures up to 4.4 cm. No suspicious renal lesions.  Cannot exclude a nonobstructive left kidney stone.   Electronically Signed By: Markus Daft M.D. On: 07/20/2014 14:05  No results found for this or any previous visit.  No  results found for this or any previous visit.  No results found for this or any previous visit.   Assessment & Plan:    1. Incomplete emptying of bladder -Continue flomax  - Urinalysis, Routine w reflex microscopic - BLADDER SCAN AMB NON-IMAGING  2. Benign prostatic hyperplasia with  urinary obstruction -Continue flomax  3. Elevated PSA RTC 3 months with PSA   No follow-ups on file.  Nicolette Bang, MD  [facility] [place]

## 2021-09-12 NOTE — Patient Instructions (Signed)
Prostate-Specific Antigen Test Why am I having this test? The prostate-specific antigen (PSA) test is a screening test for prostate cancer. It can identify early signs of prostate cancer, which may allow for more effective treatment. Your health care provider may recommend that you have a PSA test starting at age 71 or that you have one earlier or later, depending on your risk factors for prostate cancer. You may also have a PSA test: To monitor treatment of prostate cancer. To check whether prostate cancer has returned after treatment. If you have signs of other conditions that can affect PSA levels, such as: An enlarged prostate that is not caused by cancer (benign prostatic hyperplasia, BPH). This condition is very common in older men. A prostate infection. What is being tested? This test measures the amount of PSA in your blood. PSA is a protein that is made in the prostate. The prostate naturally produces more PSA as you age, but very high levels may be a sign of a medical condition. What kind of sample is taken? A blood sample is required for this test. It is usually collected by inserting a needle into a blood vessel or by sticking a finger with a small needle. Blood for this test should be drawn before having an exam of the prostate. How do I prepare for this test? Do not ejaculate starting 24 hours before your test, or as long as told by your health care provider. Tell a health care provider about: Any allergies you have. All medicines you are taking, including vitamins, herbs, eye drops, creams, and over-the-counter medicines. This also includes: Medicines to assist with hair growth, such as finasteride. Any recent exposure to a medicine called diethylstilbestrol. Any blood disorders you have. Any recent procedures you have had, especially any procedures involving the prostate or rectum. Any medical conditions you have. Any recent urinary tract infections (UTIs) you have had. How are  the results reported? Your test results will be reported as a value that indicates how much PSA is in your blood. This will be given as nanograms of PSA per milliliter of blood (ng/mL). Your health care provider will compare your results to normal ranges that were established after testing a large group of people (reference ranges). Reference ranges may vary among labs and hospitals. PSA levels vary from person to person and generally increase with age. Because of this variation, there is no single PSA value that is considered normal for everyone. Instead, PSA reference ranges are used to describe whether your PSA levels are considered low or high (elevated). Common reference ranges are: Low: 0-2.5 ng/mL. Slightly to moderately elevated: 2.6-10.0 ng/mL. Moderately elevated: 10.0-19.9 ng/mL. Significantly elevated: 20 ng/mL or greater. Sometimes, the test results may report that a condition is present when it is not present (false-positive result). What do the results mean? A test result that is higher than 4 ng/mL may mean that you are at an increased risk for prostate cancer. However, a PSA test by itself is not enough to diagnose prostate cancer. High PSA levels may also be caused by the natural aging process, prostate infection, or BPH. PSA screening cannot tell you if your PSA is high due to cancer or a different cause. A prostate biopsy is the only way to diagnose prostate cancer. A risk of having the PSA test is diagnosing and treating prostate cancer that would never have caused any symptoms or problems (overdiagnosis and overtreatment). Talk with your health care provider about what your results mean. Questions  to ask your health care provider Ask your health care provider, or the department that is doing the test: When will my results be ready? How will I get my results? What are my treatment options? What other tests do I need? What are my next steps? Summary The prostate-specific  antigen (PSA) test is a screening test for prostate cancer. Your health care provider may recommend that you have a PSA test starting at age 63 or that you have one earlier or later, depending on your risk factors for prostate cancer. A test result that is higher than 4 ng/mL may mean that you are at an increased risk for prostate cancer. However, elevated levels can be caused by a number of conditions other than prostate cancer. Talk with your health care provider about what your results mean. This information is not intended to replace advice given to you by your health care provider. Make sure you discuss any questions you have with your health care provider. Document Revised: 08/19/2020 Document Reviewed: 08/19/2020 Elsevier Patient Education  2022 [employer].

## 2021-09-14 ENCOUNTER — Ambulatory Visit: Payer: Medicare Other | Admitting: Urology

## 2021-09-15 ENCOUNTER — Encounter: Payer: Self-pay | Admitting: Family Medicine

## 2021-09-15 ENCOUNTER — Other Ambulatory Visit: Payer: Self-pay

## 2021-09-15 ENCOUNTER — Ambulatory Visit (INDEPENDENT_AMBULATORY_CARE_PROVIDER_SITE_OTHER): Payer: Medicare Other | Admitting: Family Medicine

## 2021-09-15 VITALS — BP 141/84 | HR 96 | Resp 19 | Ht 73.0 in | Wt 247.0 lb

## 2021-09-15 DIAGNOSIS — E669 Obesity, unspecified: Secondary | ICD-10-CM

## 2021-09-15 DIAGNOSIS — E785 Hyperlipidemia, unspecified: Secondary | ICD-10-CM | POA: Diagnosis not present

## 2021-09-15 DIAGNOSIS — L98491 Non-pressure chronic ulcer of skin of other sites limited to breakdown of skin: Secondary | ICD-10-CM

## 2021-09-15 DIAGNOSIS — E1169 Type 2 diabetes mellitus with other specified complication: Secondary | ICD-10-CM | POA: Diagnosis not present

## 2021-09-15 DIAGNOSIS — J42 Unspecified chronic bronchitis: Secondary | ICD-10-CM | POA: Diagnosis not present

## 2021-09-15 DIAGNOSIS — I10 Essential (primary) hypertension: Secondary | ICD-10-CM | POA: Diagnosis not present

## 2021-09-15 DIAGNOSIS — Z720 Tobacco use: Secondary | ICD-10-CM

## 2021-09-15 DIAGNOSIS — F411 Generalized anxiety disorder: Secondary | ICD-10-CM

## 2021-09-15 DIAGNOSIS — L98499 Non-pressure chronic ulcer of skin of other sites with unspecified severity: Secondary | ICD-10-CM | POA: Insufficient documentation

## 2021-09-15 DIAGNOSIS — M549 Dorsalgia, unspecified: Secondary | ICD-10-CM

## 2021-09-15 DIAGNOSIS — Z0001 Encounter for general adult medical examination with abnormal findings: Secondary | ICD-10-CM

## 2021-09-15 DIAGNOSIS — M25551 Pain in right hip: Secondary | ICD-10-CM

## 2021-09-15 DIAGNOSIS — Z23 Encounter for immunization: Secondary | ICD-10-CM | POA: Diagnosis not present

## 2021-09-15 DIAGNOSIS — F17210 Nicotine dependence, cigarettes, uncomplicated: Secondary | ICD-10-CM

## 2021-09-15 MED ORDER — OLMESARTAN MEDOXOMIL 40 MG PO TABS: 40.0000 mg | ORAL_TABLET | Freq: Every day | ORAL | 1 refills | Status: DC

## 2021-09-15 MED ORDER — KETOROLAC TROMETHAMINE 60 MG/2ML IM SOLN
60.0000 mg | Freq: Once | INTRAMUSCULAR | Status: AC
  Administered 2021-09-15: 60 mg via INTRAMUSCULAR

## 2021-09-15 MED ORDER — TRAZODONE HCL 50 MG PO TABS
50.0000 mg | ORAL_TABLET | Freq: Every evening | ORAL | 1 refills | Status: DC | PRN
Start: 2021-09-15 — End: 2022-02-08

## 2021-09-15 MED ORDER — TRIAMTERENE-HCTZ 37.5-25 MG PO TABS: ORAL_TABLET | ORAL | 0 refills | Status: DC

## 2021-09-15 MED ORDER — BUDESONIDE-FORMOTEROL FUMARATE 160-4.5 MCG/ACT IN AERO: 2.0000 | INHALATION_SPRAY | Freq: Two times a day (BID) | RESPIRATORY_TRACT | 12 refills | Status: DC

## 2021-09-15 MED ORDER — BUSPIRONE HCL 7.5 MG PO TABS: 7.5000 mg | ORAL_TABLET | Freq: Three times a day (TID) | ORAL | 3 refills | Status: DC

## 2021-09-15 MED ORDER — EZETIMIBE 10 MG PO TABS: 10.0000 mg | ORAL_TABLET | Freq: Every day | ORAL | 1 refills | Status: DC

## 2021-09-15 MED ORDER — MUPIROCIN CALCIUM 2 % EX CREA: 1.0000 "application " | TOPICAL_CREAM | Freq: Two times a day (BID) | CUTANEOUS | 0 refills | Status: DC

## 2021-09-15 NOTE — Progress Notes (Signed)
Matthew Molina     MRN: [id number]      DOB: July 04, 1951   HPI: Patient is in for annual physical exam. C/o  increased low back, right hip and right leg pain, wants a a shot. C/o uncontrolled anxiety C/o abrasion on left index and redness of right great toe x 2 days Recent labs, are reviewed. Immunization is reviewed , and  updated .    PE; BP (!) 141/84   Pulse 96   Resp 19   Ht 6\' 1"  (1.854 m)   Wt 247 lb (112 kg)   SpO2 95%   BMI 32.59 kg/m  Pleasant male, alert and oriented x 3, in no cardio-pulmonary distress. Afebrile. HEENT No facial trauma or asymetry. Sinuses non tender. EOMI External ears normal,  Neck: supple, no adenopathy,JVD or thyromegaly.No bruits.  Chest: Clear to ascultation bilaterally.No crackles or wheezes.Decreased air entry throughout Non tender to palpation  Cardiovascular system; Heart sounds normal,  S1 and  S2 ,no S3.  No murmur, or thrill. Apical beat not displaced Peripheral pulses normal.  Abdomen: Soft, non tender, .    Musculoskeletal exam: Decreased ROM of spine, hips , shoulders and knees.  deformity ,swelling or crepitus noted. No muscle wasting or atrophy.   Neurologic: Cranial nerves 2 to 12 intact. Power, tone ,sensation and reflexes normal throughout. disturbance in gait. No tremor.  Skin: Intact, no ulceration, erythema , scaling or rash noted. Pigmentation normal throughout  Psych; Normal mood and affect. Judgement and concentration normal   Assessment & Plan:   Annual visit for general adult medical examination with abnormal findings Annual exam as documented. Counseling done  re healthy lifestyle involving commitment to 150 minutes exercise per week, heart healthy diet, and attaining healthy weight.The importance of adequate sleep also discussed. Regular seat belt use and home safety, is also discussed. Changes in health habits are decided on by the patient with goals and time frames  set for achieving  them. Immunization and cancer screening needs are specifically addressed at this visit.   Back pain with radiation Toradol 60 mg im in office  GAD (generalized anxiety disorder) Uncontrolled, increase buspar to  3 times daily   COPD (chronic obstructive pulmonary disease) (HCC) Increased shortness  ofbreath and cough, needs symbicort, same prescribed  Tobacco abuse Asked:confirms currently smokes cigarettes Assess: Unwilling to set a quit date, but is cutting back Advise: needs to QUIT to reduce risk of cancer, cardio and cerebrovascular disease Assist: counseled for 5 minutes and literature provided Arrange: follow up in 2 to 4 months   Diabetes mellitus type 2 in obese [facility]) [name] is reminded of the importance of commitment to daily physical activity for 30 minutes or more, as able and the need to limit carbohydrate intake to 30 to 60 grams per meal to help with blood sugar control.   The need to take medication as prescribed, test blood sugar as directed, and to call between visits if there is a concern that blood sugar is uncontrolled is also discussed.   [name] is reminded of the importance of daily foot exam, annual eye examination, and good blood sugar, blood pressure and cholesterol control.  Diabetic Labs Latest Ref Rng & Units 09/03/2021 06/23/2021 01/25/2021 09/13/2020 05/27/2020  HbA1c 4.8 - 5.6 % - 8.9(H) 7.3(H) 6.5(A) 7.8(A)  Microalbumin Not Estab. ug/mL - - - - -  Micro/Creat Ratio 0.0 - 30.0 mg/g creat - - - - -  Chol 100 - 199 mg/dL - -  94(L) - -  HDL >39 mg/dL - - 39(L) - -  Calc LDL 0 - 99 mg/dL - - 28 - -  Triglycerides 0 - 149 mg/dL - - 163(H) - -  Creatinine 0.61 - 1.24 mg/dL 1.52(H) 1.30(H) 1.37(H) - -   BP/Weight 09/15/2021 09/12/2021 09/03/2021 06/28/2021 06/23/2021 7/56/4332 08/23/1883  Systolic BP 166 063 016 010 932 355 732  Diastolic BP 84 71 88 90 80 84 96  Wt. (Lbs) 247 - 240 245.6 250 249.4 252  BMI 32.59 - 31.66 32.4 32.98 32.9 33.25  Some  encounter information is confidential and restricted. Go to Review Flowsheets activity to see all data.   Foot/eye exam completion dates Latest Ref Rng & Units 07/05/2021 10/08/2019  Eye Exam No Retinopathy No Retinopathy -  Foot Form Completion - - Done        Skin ulcer (HCC) Bactroban twice daily to finger and freat toe for 5 days , then as needed  Hypertension Controlled, no change in medication DASH diet and commitment to daily physical activity for a minimum of 30 minutes discussed and encouraged, as a part of hypertension management. The importance of attaining a healthy weight is also discussed.  BP/Weight 09/15/2021 09/12/2021 09/03/2021 06/28/2021 06/23/2021 01/19/5426 0/05/2375  Systolic BP 283 151 761 607 371 062 694  Diastolic BP 84 71 88 90 80 84 96  Wt. (Lbs) 247 - 240 245.6 250 249.4 252  BMI 32.59 - 31.66 32.4 32.98 32.9 33.25  Some encounter information is confidential and restricted. Go to Review Flowsheets activity to see all data.  meds refilled

## 2021-09-15 NOTE — Assessment & Plan Note (Signed)
Bactroban twice daily to finger and freat toe for 5 days , then as needed

## 2021-09-15 NOTE — Assessment & Plan Note (Signed)
Uncontrolled, increase buspar to  3 times daily

## 2021-09-15 NOTE — Assessment & Plan Note (Signed)

## 2021-09-15 NOTE — Assessment & Plan Note (Signed)
Asked:confirms currently smokes cigarettes °Assess: Unwilling to set a quit date, but is cutting back °Advise: needs to QUIT to reduce risk of cancer, cardio and cerebrovascular disease °Assist: counseled for 5 minutes and literature provided °Arrange: follow up in 2 to 4 months ° °

## 2021-09-15 NOTE — Assessment & Plan Note (Signed)
Controlled, no change in medication DASH diet and commitment to daily physical activity for a minimum of 30 minutes discussed and encouraged, as a part of hypertension management. The importance of attaining a healthy weight is also discussed.  BP/Weight 09/15/2021 09/12/2021 09/03/2021 06/28/2021 06/23/2021 3/73/4287 05/25/1156  Systolic BP 262 035 597 416 384 536 468  Diastolic BP 84 71 88 90 80 84 96  Wt. (Lbs) 247 - 240 245.6 250 249.4 252  BMI 32.59 - 31.66 32.4 32.98 32.9 33.25  Some encounter information is confidential and restricted. Go to Review Flowsheets activity to see all data.  meds refilled

## 2021-09-15 NOTE — Assessment & Plan Note (Signed)
Increased shortness  ofbreath and cough, needs symbicort, same prescribed

## 2021-09-15 NOTE — Patient Instructions (Addendum)
Annuale exam in 366 days  F/u re evaluate chronic problems in 6 weeks  Flu vaccine today  Need to work on quitting smoking, call [phone] for help  Nurse please refill 90 day SUPPLY  with 2 refills for olmesartan, triamterene, ezetimide, risperidone 4 and 1 mg and  trrazodone  Toradol 60 mg iM in office today for right hip pain  Increase in buspar for anxiety, to one tablet 3 times daily, 8am, 3 pm and 11 pm  Microalb today if possible  Fasting lipid, cmp and EGFR, HBA1C 5 days before November visit ' Symbicort is prescribed  Antibiotic ointment prescribed use twice daily for 5 days to right great toe and infected left finger  Thanks for choosing [facility], we consider it a privelige to serve you.

## 2021-09-15 NOTE — Assessment & Plan Note (Signed)
Toradol 60 mg im in office

## 2021-09-15 NOTE — Assessment & Plan Note (Signed)
Matthew Molina is reminded of the importance of commitment to daily physical activity for 30 minutes or more, as able and the need to limit carbohydrate intake to 30 to 60 grams per meal to help with blood sugar control.   The need to take medication as prescribed, test blood sugar as directed, and to call between visits if there is a concern that blood sugar is uncontrolled is also discussed.   Matthew Molina is reminded of the importance of daily foot exam, annual eye examination, and good blood sugar, blood pressure and cholesterol control.  Diabetic Labs Latest Ref Rng & Units 09/03/2021 06/23/2021 01/25/2021 09/13/2020 05/27/2020  HbA1c 4.8 - 5.6 % - 8.9(H) 7.3(H) 6.5(A) 7.8(A)  Microalbumin Not Estab. ug/mL - - - - -  Micro/Creat Ratio 0.0 - 30.0 mg/g creat - - - - -  Chol 100 - 199 mg/dL - - 94(L) - -  HDL >39 mg/dL - - 39(L) - -  Calc LDL 0 - 99 mg/dL - - 28 - -  Triglycerides 0 - 149 mg/dL - - 163(H) - -  Creatinine 0.61 - 1.24 mg/dL 1.52(H) 1.30(H) 1.37(H) - -   BP/Weight 09/15/2021 09/12/2021 09/03/2021 06/28/2021 06/23/2021 05/19/931 02/20/5731  Systolic BP 202 542 706 237 628 315 176  Diastolic BP 84 71 88 90 80 84 96  Wt. (Lbs) 247 - 240 245.6 250 249.4 252  BMI 32.59 - 31.66 32.4 32.98 32.9 33.25  Some encounter information is confidential and restricted. Go to Review Flowsheets activity to see all data.   Foot/eye exam completion dates Latest Ref Rng & Units 07/05/2021 10/08/2019  Eye Exam No Retinopathy No Retinopathy -  Foot Form Completion - - Done

## 2021-09-17 LAB — MICROALBUMIN / CREATININE URINE RATIO
Creatinine, Urine: 128.1 mg/dL
Microalb/Creat Ratio: 54 mg/g creat — ABNORMAL HIGH (ref 0–29)
Microalbumin, Urine: 69.4 ug/mL

## 2021-09-19 ENCOUNTER — Telehealth: Payer: Self-pay | Admitting: Family Medicine

## 2021-09-19 NOTE — Telephone Encounter (Signed)
Patient called and is out of Albuterol and needs a prescription refill sent in.

## 2021-09-20 ENCOUNTER — Other Ambulatory Visit: Payer: Self-pay

## 2021-09-20 DIAGNOSIS — J449 Chronic obstructive pulmonary disease, unspecified: Secondary | ICD-10-CM

## 2021-09-20 MED ORDER — ALBUTEROL SULFATE HFA 108 (90 BASE) MCG/ACT IN AERS: 2.0000 | INHALATION_SPRAY | Freq: Four times a day (QID) | RESPIRATORY_TRACT | 5 refills | Status: DC | PRN

## 2021-09-20 NOTE — Telephone Encounter (Signed)
Refill sent in

## 2021-09-21 ENCOUNTER — Other Ambulatory Visit: Payer: Self-pay | Admitting: Family Medicine

## 2021-09-21 NOTE — Telephone Encounter (Signed)
[facility] is [facility] no meds [facility] for pt --please call and call the pt

## 2021-09-21 NOTE — Telephone Encounter (Signed)
[facility] has rx and filling now

## 2021-10-01 DIAGNOSIS — J449 Chronic obstructive pulmonary disease, unspecified: Secondary | ICD-10-CM | POA: Diagnosis not present

## 2021-10-05 ENCOUNTER — Other Ambulatory Visit: Payer: Self-pay | Admitting: Family Medicine

## 2021-10-05 DIAGNOSIS — M25551 Pain in right hip: Secondary | ICD-10-CM

## 2021-10-05 DIAGNOSIS — M25552 Pain in left hip: Secondary | ICD-10-CM

## 2021-10-17 ENCOUNTER — Telehealth: Payer: Self-pay

## 2021-10-17 NOTE — Telephone Encounter (Signed)
Matthew Loose, NP called from [employer].  She did a telephone call and had some concerns:   O2 in home with [employer], needs portable O2 to use in his community.  Patient check his O2 and it was 98% at room air and rest.  He has no backup O2 if power outages, his concentrator will not run without power.  He gets SOB with acitivity and in/out of shower.  Uses his nebulizer 4 to 5 times a day-smoker.  Would like these address next visit with patient 10/27/2021.   Tammy call back # [phone].

## 2021-10-24 NOTE — Telephone Encounter (Signed)
Note made to address at his appt on 11/10 and will call her back with update

## 2021-10-26 ENCOUNTER — Other Ambulatory Visit: Payer: Self-pay | Admitting: Family Medicine

## 2021-10-26 DIAGNOSIS — F209 Schizophrenia, unspecified: Secondary | ICD-10-CM

## 2021-10-27 ENCOUNTER — Ambulatory Visit: Payer: Medicare Other | Admitting: Family Medicine

## 2021-10-31 ENCOUNTER — Other Ambulatory Visit: Payer: Self-pay | Admitting: Gastroenterology

## 2021-10-31 DIAGNOSIS — R945 Abnormal results of liver function studies: Secondary | ICD-10-CM | POA: Diagnosis not present

## 2021-11-01 DIAGNOSIS — J449 Chronic obstructive pulmonary disease, unspecified: Secondary | ICD-10-CM | POA: Diagnosis not present

## 2021-11-01 LAB — HEPATIC FUNCTION PANEL
ALT: 50 IU/L — ABNORMAL HIGH (ref 0–44)
AST: 30 IU/L (ref 0–40)
Albumin: 4.4 g/dL (ref 3.7–4.7)
Alkaline Phosphatase: 57 IU/L (ref 44–121)
Bilirubin Total: 0.4 mg/dL (ref 0.0–1.2)
Bilirubin, Direct: 0.1 mg/dL (ref 0.00–0.40)
Total Protein: 7.1 g/dL (ref 6.0–8.5)

## 2021-11-01 LAB — SPECIMEN STATUS REPORT

## 2021-11-08 ENCOUNTER — Ambulatory Visit (INDEPENDENT_AMBULATORY_CARE_PROVIDER_SITE_OTHER): Payer: Medicare Other

## 2021-11-08 ENCOUNTER — Other Ambulatory Visit: Payer: Self-pay

## 2021-11-08 ENCOUNTER — Encounter (INDEPENDENT_AMBULATORY_CARE_PROVIDER_SITE_OTHER): Payer: Self-pay

## 2021-11-08 DIAGNOSIS — Z0000 Encounter for general adult medical examination without abnormal findings: Secondary | ICD-10-CM | POA: Diagnosis not present

## 2021-11-08 NOTE — Patient Instructions (Signed)
[name] , Thank you for taking time to come for your Medicare Wellness Visit. I appreciate your ongoing commitment to your health goals. Please review the following plan we discussed and let me know if I can assist you in the future.   These are the goals we discussed:  Goals      Exercise 3x per week (30 min per time)     Recommend starting a routine exercise program at least 3 days a week for 30-45 minutes at a time as tolerated.       Patient Stated     Walking more.        This is a list of the screening recommended for you and due dates:  Health Maintenance  Topic Date Due   Zoster (Shingles) Vaccine (2 of 2) 07/30/2018   COVID-19 Vaccine (5 - Booster for Moderna series) 09/30/2021   Hemoglobin A1C  12/24/2021   Eye exam for diabetics  07/05/2022   Complete foot exam   09/15/2022   Tetanus Vaccine  02/17/2027   Colon Cancer Screening  04/01/2030   Pneumonia Vaccine  Completed   Flu Shot  Completed   Hepatitis C Screening: USPSTF Recommendation to screen - Ages 18-79 yo.  Completed   HPV Vaccine  Aged Out   [name] , Thank you for taking time to come for your Medicare Wellness Visit. I appreciate your ongoing commitment to your health goals. Please review the following plan we discussed and let me know if I can assist you in the future.   Screening recommendations/referrals: Colonoscopy:Complete Mammogram: n/a Bone Density: due now  Recommended yearly ophthalmology/optometry visit for glaucoma screening and checkup Recommended yearly dental visit for hygiene and checkup  Vaccinations: Influenza vaccine: complete Pneumococcal vaccine: complete Tdap vaccine: complete Shingles vaccine: due now     Advanced directives: patient declined   Conditions/risks identified: hypertension,diabetes  Next appointment: 1 year    Preventive Care 71 Years and Older, Male Preventive care refers to lifestyle choices and visits with your health care provider that can  promote health and wellness. What does preventive care include? A yearly physical exam. This is also called an annual well check. Dental exams once or twice a year. Routine eye exams. Ask your health care provider how often you should have your eyes checked. Personal lifestyle choices, including: Daily care of your teeth and gums. Regular physical activity. Eating a healthy diet. Avoiding tobacco and drug use. Limiting alcohol use. Practicing safe sex. Taking low-dose aspirin every day. Taking vitamin and mineral supplements as recommended by your health care provider. What happens during an annual well check? The services and screenings done by your health care provider during your annual well check will depend on your age, overall health, lifestyle risk factors, and family history of disease. Counseling  Your health care provider may ask you questions about your: Alcohol use. Tobacco use. Drug use. Emotional well-being. Home and relationship well-being. Sexual activity. Eating habits. History of falls. Memory and ability to understand (cognition). Work and work Statistician. Reproductive health. Screening  You may have the following tests or measurements: Height, weight, and BMI. Blood pressure. Lipid and cholesterol levels. These may be checked every 5 years, or more frequently if you are over 52 years old. Skin check. Lung cancer screening. You may have this screening every year starting at age 50 if you have a 30-pack-year history of smoking and currently smoke or have quit within the past 15 years. Fecal occult blood test (FOBT)  of the stool. You may have this test every year starting at age 71. Flexible sigmoidoscopy or colonoscopy. You may have a sigmoidoscopy every 5 years or a colonoscopy every 10 years starting at age 71. Hepatitis C blood test. Hepatitis B blood test. Sexually transmitted disease (STD) testing. Diabetes screening. This is done by checking your  blood sugar (glucose) after you have not eaten for a while (fasting). You may have this done every 1-3 years. Bone density scan. This is done to screen for osteoporosis. You may have this done starting at age 21. Mammogram. This may be done every 1-2 years. Talk to your health care provider about how often you should have regular mammograms. Talk with your health care provider about your test results, treatment options, and if necessary, the need for more tests. Vaccines  Your health care provider may recommend certain vaccines, such as: Influenza vaccine. This is recommended every year. Tetanus, diphtheria, and acellular pertussis (Tdap, Td) vaccine. You may need a Td booster every 10 years. Zoster vaccine. You may need this after age 54. Pneumococcal 13-valent conjugate (PCV13) vaccine. One dose is recommended after age 38. Pneumococcal polysaccharide (PPSV23) vaccine. One dose is recommended after age 1. Talk to your health care provider about which screenings and vaccines you need and how often you need them. This information is not intended to replace advice given to you by your health care provider. Make sure you discuss any questions you have with your health care provider. Document Released: 12/31/2015 Document Revised: 08/23/2016 Document Reviewed: 10/05/2015 Elsevier Interactive Patient Education  2017 [place] Prevention in the Home Falls can cause injuries. They can happen to people of all ages. There are many things you can do to make your home safe and to help prevent falls. What can I do on the outside of my home? Regularly fix the edges of walkways and driveways and fix any cracks. Remove anything that might make you trip as you walk through a door, such as a raised step or threshold. Trim any bushes or trees on the path to your home. Use bright outdoor lighting. Clear any walking paths of anything that might make someone trip, such as rocks or tools. Regularly  check to see if handrails are loose or broken. Make sure that both sides of any steps have handrails. Any raised decks and porches should have guardrails on the edges. Have any leaves, snow, or ice cleared regularly. Use sand or salt on walking paths during winter. Clean up any spills in your garage right away. This includes oil or grease spills. What can I do in the bathroom? Use night lights. Install grab bars by the toilet and in the tub and shower. Do not use towel bars as grab bars. Use non-skid mats or decals in the tub or shower. If you need to sit down in the shower, use a plastic, non-slip stool. Keep the floor dry. Clean up any water that spills on the floor as soon as it happens. Remove soap buildup in the tub or shower regularly. Attach bath mats securely with double-sided non-slip rug tape. Do not have throw rugs and other things on the floor that can make you trip. What can I do in the bedroom? Use night lights. Make sure that you have a light by your bed that is easy to reach. Do not use any sheets or blankets that are too big for your bed. They should not hang down onto the floor. Have a firm  chair that has side arms. You can use this for support while you get dressed. Do not have throw rugs and other things on the floor that can make you trip. What can I do in the kitchen? Clean up any spills right away. Avoid walking on wet floors. Keep items that you use a lot in easy-to-reach places. If you need to reach something above you, use a strong step stool that has a grab bar. Keep electrical cords out of the way. Do not use floor polish or wax that makes floors slippery. If you must use wax, use non-skid floor wax. Do not have throw rugs and other things on the floor that can make you trip. What can I do with my stairs? Do not leave any items on the stairs. Make sure that there are handrails on both sides of the stairs and use them. Fix handrails that are broken or loose.  Make sure that handrails are as long as the stairways. Check any carpeting to make sure that it is firmly attached to the stairs. Fix any carpet that is loose or worn. Avoid having throw rugs at the top or bottom of the stairs. If you do have throw rugs, attach them to the floor with carpet tape. Make sure that you have a light switch at the top of the stairs and the bottom of the stairs. If you do not have them, ask someone to add them for you. What else can I do to help prevent falls? Wear shoes that: Do not have high heels. Have rubber bottoms. Are comfortable and fit you well. Are closed at the toe. Do not wear sandals. If you use a stepladder: Make sure that it is fully opened. Do not climb a closed stepladder. Make sure that both sides of the stepladder are locked into place. Ask someone to hold it for you, if possible. Clearly mark and make sure that you can see: Any grab bars or handrails. First and last steps. Where the edge of each step is. Use tools that help you move around (mobility aids) if they are needed. These include: Canes. Walkers. Scooters. Crutches. Turn on the lights when you go into a dark area. Replace any light bulbs as soon as they burn out. Set up your furniture so you have a clear path. Avoid moving your furniture around. If any of your floors are uneven, fix them. If there are any pets around you, be aware of where they are. Review your medicines with your doctor. Some medicines can make you feel dizzy. This can increase your chance of falling. Ask your doctor what other things that you can do to help prevent falls. This information is not intended to replace advice given to you by your health care provider. Make sure you discuss any questions you have with your health care provider. Document Released: 09/30/2009 Document Revised: 05/11/2016 Document Reviewed: 01/08/2015 Elsevier Interactive Patient Education  2017 [employer].

## 2021-11-08 NOTE — Progress Notes (Signed)
Subjective:   Matthew Molina is a 70 y.o. male who presents for Medicare Annual/Subsequent preventive examination.  I connected with  Matthew Molina on 11/08/21 by a audio enabled telemedicine application and verified that I am speaking with the correct person using two identifiers.  Patient Location: Home  Provider Location: Office/Clinic  I discussed the limitations of evaluation and management by telemedicine. The patient expressed understanding and agreed to proceed.   Review of Systems       [name] , Thank you for taking time to come for your Medicare Wellness Visit. I appreciate your ongoing commitment to your health goals. Please review the following plan we discussed and let me know if I can assist you in the future.   These are the goals we discussed:  Goals      Exercise 3x per week (30 min per time)     Recommend starting a routine exercise program at least 3 days a week for 30-45 minutes at a time as tolerated.       Patient Stated     Walking more.        This is a list of the screening recommended for you and due dates:  Health Maintenance  Topic Date Due   Zoster (Shingles) Vaccine (2 of 2) 07/30/2018   COVID-19 Vaccine (5 - Booster for Moderna series) 09/30/2021   Hemoglobin A1C  12/24/2021   Eye exam for diabetics  07/05/2022   Complete foot exam   09/15/2022   Tetanus Vaccine  02/17/2027   Colon Cancer Screening  04/01/2030   Pneumonia Vaccine  Completed   Flu Shot  Completed   Hepatitis C Screening: USPSTF Recommendation to screen - Ages 18-79 yo.  Completed   HPV Vaccine  Aged Out    Cardiac Risk Factors include: hypertension;diabetes mellitus     Objective:    Today's Vitals   11/08/21 1032 11/08/21 1033  PainSc: 0-No pain 0-No pain   There is no height or weight on file to calculate BMI.  Advanced Directives 11/08/2021 09/03/2021 07/27/2020 04/01/2020 01/06/2020 04/17/2019 04/17/2019  Does Patient Have a Medical Advance Directive? No  No No No No No No  Would patient like information on creating a medical advance directive? No - Patient declined No - Patient declined Yes (ED - Information included in AVS) No - Patient declined No - Patient declined No - Patient declined -  Pre-existing out of facility DNR order (yellow form or pink MOST form) - - - - - - -    Current Medications (verified) Outpatient Encounter Medications as of 11/08/2021  Medication Sig   ACCU-CHEK GUIDE test strip USE AS DIRECTED TO CHECK BLOOD SUGAR ONCE DAILY.   Accu-Chek Softclix Lancets lancets TEST BLOOD SUGAR ONCE DAILY AS DIRECTED.   acetaminophen (TYLENOL) 500 MG tablet Take 500-1,000 mg by mouth as needed for headache. Takes mostly for HA about 2 x/week.   albuterol (VENTOLIN HFA) 108 (90 Base) MCG/ACT inhaler Inhale 2 puffs into the lungs every 6 (six) hours as needed for wheezing or shortness of breath.   blood glucose meter kit and supplies Dispense based on patient and insurance preference.Use to check blood sugar once daily (FOR ICD-10 E10.9, E11.9).   Blood Glucose Monitoring Suppl (ACCU-CHEK AVIVA PLUS) w/Device KIT accucheck aviva meter Once daily testing DX e11.9   budesonide-formoterol (SYMBICORT) 160-4.5 MCG/ACT inhaler Inhale 2 puffs into the lungs in the morning and at bedtime.   busPIRone (BUSPAR) 7.5 MG tablet Take  1 tablet (7.5 mg total) by mouth 3 (three) times daily.   docusate sodium (COLACE) 100 MG capsule Take 100 mg by mouth daily as needed for mild constipation.    ezetimibe (ZETIA) 10 MG tablet Take 1 tablet (10 mg total) by mouth daily.   glipiZIDE (GLUCOTROL XL) 10 MG 24 hr tablet Take two tablets by mouth every morning at breakfast   ipratropium-albuterol (DUONEB) 0.5-2.5 (3) MG/3ML SOLN USE 1 VIAL VIA NEBULIZER EVERY 4-6 HOURS.   meloxicam (MOBIC) 7.5 MG tablet TAKE 1 TABLET BY MOUTH ONCE DAILY.   mupirocin cream (BACTROBAN) 2 % Apply 1 application topically 2 (two) times daily.   mupirocin ointment (BACTROBAN) 2 %  Apply topically 2 (two) times daily.   olmesartan (BENICAR) 40 MG tablet Take 1 tablet (40 mg total) by mouth daily.   pantoprazole (PROTONIX) 40 MG tablet TAKE ONE TABLET BY MOUTH ONCE DAILY.   risperiDONE (RISPERDAL) 1 MG tablet TAKE ONE TABLET BY MOUTH IN THE EVENING. TAKE WITH $RemoveB'4MG'eSVJyGDg$  TABLET.   risperidone (RISPERDAL) 4 MG tablet TAKE ONE TABLET BY MOUTH IN THE EVENING. TAKE WITH $RemoveB'1MG'oJwpOlvo$  TABLET.   sucralfate (CARAFATE) 1 g tablet TAKE 1 TABLET BY MOUTH FOUR TIMES A DAY AS NEEDED.   tamsulosin (FLOMAX) 0.4 MG CAPS capsule Take 1 capsule (0.4 mg total) by mouth daily after supper.   traZODone (DESYREL) 50 MG tablet Take 1 tablet (50 mg total) by mouth at bedtime as needed. for sleep   triamterene-hydrochlorothiazide (MAXZIDE-25) 37.5-25 MG tablet TAKE 1/2 TABLET BY MOUTH ONCE DAILY.   UNABLE TO FIND Nebulizer mouthpiece and tubing x 1  DX COPD   UNABLE TO FIND XL pullups for daily use DX Incontinence   UNABLE TO FIND Nebulizer machine Mask and tubing x 1  DX J44.9   [DISCONTINUED] sildenafil (VIAGRA) 100 MG tablet Take 100 mg by mouth. Take one tablet 30 mins before intercourse    No facility-administered encounter medications on file as of 11/08/2021.    Allergies (verified) Sertraline hcl and Wellbutrin [bupropion]   History: Past Medical History:  Diagnosis Date   Acute blood loss anemia 01/05/2020   Acute kidney injury ([place])    Acute respiratory failure with hypoxia ([facility])    Arthritis    COPD (chronic obstructive pulmonary disease) ([place])    Depression    Diabetes mellitus    Diabetes mellitus without complication ([place])    Diverticulitis    Elevated PSA 10/01/2016   Encounter for support and coordination of transition of care 01/14/2020   Hypercholesterolemia    Hyperlipidemia    Hypertension    Insomnia 01/05/2016   Ischemic colitis ([place]) 01/05/2020   Multiple lung nodules on CT 04/02/2015   Nicotine addiction    Obesity    Oxygen deficiency    qhs   Schizophrenia [facility])     Past Surgical History:  Procedure Laterality Date   CATARACT EXTRACTION W/PHACO Left 11/20/2013   Procedure: CATARACT EXTRACTION PHACO AND INTRAOCULAR LENS PLACEMENT ([place]);  Surgeon: Tonny Branch, MD;  Location: [facility];  Service: Ophthalmology;  Laterality: Left;  CDE:10.26   CATARACT EXTRACTION W/PHACO Right 12/08/2013   Procedure: RIGHT EYE CATARACT EXTRACTION PHACO AND INTRAOCULAR LENS PLACEMENT ;  Surgeon: Tonny Branch, MD;  Location: [facility];  Service: Ophthalmology;  Laterality: Right;  CDE 12.38   COLONOSCOPY N/A 04/01/2013   pancolonic diverticulosis, redundant colon, large internal hemorrhoids.    COLONOSCOPY  2014   INCOMPLETE PREP IN R COLON   COLONOSCOPY N/A 04/01/2020  Procedure: COLONOSCOPY;  Surgeon: Danie Binder, MD;  Location: [facility];  Service: Endoscopy;  Laterality: N/A;  8:45am   ESOPHAGOGASTRODUODENOSCOPY N/A 02/18/2016   stricture at GE junction, moderate erosive gastritis and mild non-erosive duodenitis. +H.pylori. Treated initially with Prevpac. Breath test to check for eradication was positive, and he was prescribed Pylera.    EYE SURGERY Left 11/2013   cataract extraction   POLYPECTOMY  04/01/2020   Procedure: POLYPECTOMY;  Surgeon: Danie Binder, MD;  Location: [facility];  Service: Endoscopy;;   SAVORY DILATION N/A 02/18/2016   Procedure: SAVORY DILATION;  Surgeon: Danie Binder, MD;  Location: [facility];  Service: Endoscopy;  Laterality: N/A;   SHOULDER SURGERY     Family History  Problem Relation Age of Onset   Diabetes Mother    Hypertension Mother    Stroke Mother    Diabetes Sister    Heart disease Sister    Kidney disease Father    Drug abuse Brother    Colon cancer Neg Hx    Colon polyps Neg Hx    Social History   Socioeconomic History   Marital status: Divorced    Spouse name: Not on file   Number of children: 2   Years of education: Not on file   Highest education level: Not on file  Occupational History   Occupation: retired  from [employer] Use   Smoking status: Every Day    Packs/day: 2.00    Years: 48.00    Pack years: 96.00    Types: Cigarettes   Smokeless tobacco: Never  Vaping Use   Vaping Use: Never used  Substance and Sexual Activity   Alcohol use: Yes    Alcohol/week: 0.0 standard drinks    Comment: "little bit of beer"   Drug use: No   Sexual activity: Not Currently    Birth control/protection: None  Other Topics Concern   Not on file  Social History Narrative   ** Merged History Encounter **       ** Merged History Encounter **       Social Determinants of Health   Financial Resource Strain: Low Risk    Difficulty of Paying Living Expenses: Not hard at all  Food Insecurity: No Food Insecurity   Worried About Charity fundraiser in the Last Year: Never true   Arboriculturist in the Last Year: Never true  Transportation Needs: No Transportation Needs   Lack of Transportation (Medical): No   Lack of Transportation (Non-Medical): No  Physical Activity: Inactive   Days of Exercise per Week: 0 days   Minutes of Exercise per Session: 0 min  Stress: No Stress Concern Present   Feeling of Stress : Not at all  Social Connections: Moderately Integrated   Frequency of Communication with Friends and Family: Twice a week   Frequency of Social Gatherings with Friends and Family: Once a week   Attends Religious Services: 1 to 4 times per year   Active Member of [employer] or Organizations: Yes   Attends Archivist Meetings: 1 to 4 times per year   Marital Status: Divorced    Tobacco Counseling Ready to quit: No Counseling given: Yes   Clinical Intake:  Pre-visit preparation completed: Yes  Pain : No/denies pain Pain Score: 0-No pain     BMI - recorded: 32.59 Nutritional Status: BMI > 30  Obese Nutritional Risks: None Diabetes: Yes CBG done?: No Did pt. bring  in CBG monitor from home?: No  How often do you need to have someone help you when you read instructions,  pamphlets, or other written materials from your doctor or pharmacy?: 2 - Rarely What is the last grade level you completed in school?: 12  Diabetic?  Nutrition Risk Assessment:  Has the patient had any N/V/D within the last 2 months?  No  Does the patient have any non-healing wounds?  No  Has the patient had any unintentional weight loss or weight gain?  No   Diabetes:  Is the patient diabetic?  Yes  If diabetic, was a CBG obtained today?  No  Did the patient bring in their glucometer from home?  No  How often do you monitor your CBG's? Daily.   Financial Strains and Diabetes Management:  Are you having any financial strains with the device, your supplies or your medication? No .  Does the patient want to be seen by Chronic Care Management for management of their diabetes?  No  Would the patient like to be referred to a Nutritionist or for Diabetic Management?  No   Diabetic Exams:  Diabetic Eye Exam: Completed 07/05/21 Diabetic Foot Exam: Completed 09/15/21   Interpreter Needed?: No      Activities of Daily Living In your present state of health, do you have any difficulty performing the following activities: 11/08/2021  Hearing? N  Vision? N  Difficulty concentrating or making decisions? N  Walking or climbing stairs? Y  Dressing or bathing? Y  Doing errands, shopping? N  Preparing Food and eating ? Y  Using the Toilet? N  In the past six months, have you accidently leaked urine? N  Do you have problems with loss of bowel control? Y  Managing your Medications? Y  Managing your Finances? N  Housekeeping or managing your Housekeeping? Y  Some recent data might be hidden    Patient Care Team: Fayrene Helper, MD as PCP - General (Family Medicine) Fay Records, MD as Consulting Physician (Cardiology) Fayrene Helper, MD Fayrene Helper, MD (Family Medicine) Gardiner Barefoot, DPM as Consulting Physician (Podiatry) Madelin Headings, DO (Optometry) Irine Seal, MD as Attending Physician (Urology) Eloise Harman, DO as Consulting Physician (Internal Medicine)  Indicate any recent Medical Services you may have received from other than [facility] providers in the past year (date may be approximate).     Assessment:   This is a routine wellness examination for [place].  Hearing/Vision screen No results found.  Dietary issues and exercise activities discussed: Current Exercise Habits: The patient does not participate in regular exercise at present, Exercise limited by: None identified   Goals Addressed             This Visit's Progress    Patient Stated       Walking more.      Depression Screen PHQ 2/9 Scores 11/08/2021 11/08/2021 09/15/2021 06/23/2021 06/03/2021 02/23/2021 01/03/2021  PHQ - 2 Score 0 0 0 0 0 0 0  PHQ- 9 Score 1 0 0 - - - -  Exception Documentation - - - - - - -    Fall Risk Fall Risk  11/08/2021 09/15/2021 09/01/2021 06/23/2021 06/03/2021  Falls in the past year? 0 0 0 0 0  Number falls in past yr: 0 - 0 0 0  Injury with Fall? 0 - 0 0 0  Risk for fall due to : No Fall Risks - - No Fall Risks No Fall Risks  Risk for fall due to: Comment - - - - -  Follow up Falls evaluation completed - - Falls evaluation completed Falls evaluation completed    [place]:  Any stairs in or around the home? Yes  If so, are there any without handrails? Yes  Home free of loose throw rugs in walkways, pet beds, electrical cords, etc? Yes  Adequate lighting in your home to reduce risk of falls? Yes   ASSISTIVE DEVICES UTILIZED TO PREVENT FALLS:  Life alert? Yes  Use of a cane, walker or w/c? Yes  Grab bars in the bathroom? Yes  Shower chair or bench in shower? Yes  Elevated toilet seat or a handicapped toilet? Yes       6CIT Screen 07/27/2020 07/15/2019 07/08/2018 05/09/2017  What Year? 0 points 0 points 0 points 0 points  What month? 0 points 0 points 0 points 0 points  What time? 0 points 0 points 0  points 0 points  Count back from 20 0 points 0 points 0 points 0 points  Months in reverse 0 points 0 points 0 points 0 points  Repeat phrase 2 points 0 points 2 points 0 points  Total Score 2 0 2 0    Immunizations Immunization History  Administered Date(s) Administered   Fluad Quad(high Dose 65+) 10/08/2019, 09/13/2020, 09/15/2021   Influenza Split 09/02/2012   Influenza Whole 09/19/2007, 09/23/2009, 10/07/2010, 08/28/2011, 09/02/2015   Influenza, High Dose Seasonal PF 09/18/2018   Influenza,inj,Quad PF,6+ Mos 09/02/2013, 11/30/2014, 08/30/2015, 09/27/2016, 12/04/2017   Moderna Sars-Covid-2 Vaccination 02/12/2020, 03/23/2020, 10/22/2020, 08/05/2021   Pneumococcal Conjugate-13 06/16/2014   Pneumococcal Polysaccharide-23 06/28/2004, 08/27/2009, 04/17/2011, 05/04/2016   Td 06/28/2004   Zoster Recombinat (Shingrix) 06/04/2018, 06/04/2018    TDAP status: Up to date  Flu Vaccine status: Up to date  Pneumococcal vaccine status: Up to date  Covid-19 vaccine status: Completed vaccines  Qualifies for Shingles Vaccine? Yes   Zostavax completed Yes   Shingrix Completed?: Yes  Screening Tests Health Maintenance  Topic Date Due   Zoster Vaccines- Shingrix (2 of 2) 07/30/2018   COVID-19 Vaccine (5 - Booster for Moderna series) 09/30/2021   HEMOGLOBIN A1C  12/24/2021   OPHTHALMOLOGY EXAM  07/05/2022   FOOT EXAM  09/15/2022   TETANUS/TDAP  02/17/2027   COLONOSCOPY (Pts 45-79yrs Insurance coverage will need to be confirmed)  04/01/2030   Pneumonia Vaccine 19+ Years old  Completed   INFLUENZA VACCINE  Completed   Hepatitis C Screening  Completed   HPV VACCINES  Aged Out    Health Maintenance  Health Maintenance Due  Topic Date Due   Zoster Vaccines- Shingrix (2 of 2) 07/30/2018   COVID-19 Vaccine (5 - Booster for Moderna series) 09/30/2021    Colorectal cancer screening: Type of screening: Colonoscopy. Completed 04/01/20. Repeat every 10 years  Lung Cancer Screening: (Low  Dose CT Chest recommended if Age 62-80 years, 30 pack-year currently smoking OR have quit w/in 15years.) does qualify.   Lung Cancer Screening Referral: ordered 11/08/21  Additional Screening:  Hepatitis C Screening: does qualify; Completed 07/08/21  Vision Screening: Recommended annual ophthalmology exams for early detection of glaucoma and other disorders of the eye. Is the patient up to date with their annual eye exam?  Yes  Who is the provider or what is the name of the office in which the patient attends annual eye exams? Dr Jorja Loa If pt is not established with a provider, would they like to be referred to a  provider to establish care? No .   Dental Screening: Recommended annual dental exams for proper oral hygiene  Community Resource Referral / Chronic Care Management: CRR required this visit?  No   CCM required this visit?  No      Plan:     I have personally reviewed and noted the following in the patient's chart:   Medical and social history Use of alcohol, tobacco or illicit drugs  Current medications and supplements including opioid prescriptions. Patient is not currently taking opioid prescriptions. Functional ability and status Nutritional status Physical activity Advanced directives List of other physicians Hospitalizations, surgeries, and ER visits in previous 12 months Vitals Screenings to include cognitive, depression, and falls Referrals and appointments  In addition, I have reviewed and discussed with patient certain preventive protocols, quality metrics, and best practice recommendations. A written personalized care plan for preventive services as well as general preventive health recommendations were provided to patient.   Mr. Hewes , Thank you for taking time to come for your Medicare Wellness Visit. I appreciate your ongoing commitment to your health goals. Please review the following plan we discussed and let me know if I can assist you in the  future.   These are the goals we discussed:  Goals      Exercise 3x per week (30 min per time)     Recommend starting a routine exercise program at least 3 days a week for 30-45 minutes at a time as tolerated.       Patient Stated     Walking more.        This is a list of the screening recommended for you and due dates:  Health Maintenance  Topic Date Due   Zoster (Shingles) Vaccine (2 of 2) 07/30/2018   COVID-19 Vaccine (5 - Booster for Moderna series) 09/30/2021   Hemoglobin A1C  12/24/2021   Eye exam for diabetics  07/05/2022   Complete foot exam   09/15/2022   Tetanus Vaccine  02/17/2027   Colon Cancer Screening  04/01/2030   Pneumonia Vaccine  Completed   Flu Shot  Completed   Hepatitis C Screening: USPSTF Recommendation to screen - Ages 18-79 yo.  Completed   HPV Vaccine  Aged [address] [place]   11/08/2021   Nurse Notes:

## 2021-11-09 ENCOUNTER — Ambulatory Visit (INDEPENDENT_AMBULATORY_CARE_PROVIDER_SITE_OTHER): Payer: Medicare Other | Admitting: Podiatry

## 2021-11-09 ENCOUNTER — Other Ambulatory Visit: Payer: Self-pay

## 2021-11-09 ENCOUNTER — Encounter: Payer: Self-pay | Admitting: Podiatry

## 2021-11-09 DIAGNOSIS — E1140 Type 2 diabetes mellitus with diabetic neuropathy, unspecified: Secondary | ICD-10-CM | POA: Diagnosis not present

## 2021-11-09 DIAGNOSIS — E1149 Type 2 diabetes mellitus with other diabetic neurological complication: Secondary | ICD-10-CM

## 2021-11-09 DIAGNOSIS — Q828 Other specified congenital malformations of skin: Secondary | ICD-10-CM

## 2021-11-09 DIAGNOSIS — M2142 Flat foot [pes planus] (acquired), left foot: Secondary | ICD-10-CM

## 2021-11-09 DIAGNOSIS — M79676 Pain in unspecified toe(s): Secondary | ICD-10-CM

## 2021-11-09 DIAGNOSIS — M2141 Flat foot [pes planus] (acquired), right foot: Secondary | ICD-10-CM

## 2021-11-09 DIAGNOSIS — B351 Tinea unguium: Secondary | ICD-10-CM | POA: Diagnosis not present

## 2021-11-09 NOTE — Progress Notes (Signed)
This patient returns to my office for at risk foot care.  This patient requires this care by a professional since this patient will be at risk due to having diabetes mellitus and chronic kidney disease.  Patient has pain in the two calluses on the bottom outside of his feet.  This patient is unable to cut nails himself since the patient cannot reach his nails.These nails are painful walking and wearing shoes. He cannot treat the painful calluses himself. . This patient presents for at risk foot care today.  General Appearance  Alert, conversant and in no acute stress.  Vascular  Dorsalis pedis and posterior tibial  pulses are palpable  bilaterally.  Capillary return is within normal limits  bilaterally. Temperature is within normal limits  bilaterally.  Neurologic  Senn-Weinstein monofilament wire test diminished   bilaterally. Muscle power within normal limits bilaterally.  Nails Thick disfigured discolored nails with subungual debris  from hallux to fifth toes bilaterally. No evidence of bacterial infection or drainage bilaterally.  Orthopedic  No limitations of motion  feet .  No crepitus or effusions noted.  No bony pathology or digital deformities noted.  Skin  normotropic skin  noted bilaterally.  No signs of infections or ulcers noted.   Porokeratosis sub 5th metabase  B/L.  Onychomycosis  Pain in right toes  Pain in left toes  Porokeratosis  X 2.  Consent was obtained for treatment procedures.   Mechanical debridement of nails 1-5  bilaterally performed with a nail nipper.  Filed with dremel without incident. Debridement of porokeratosis  with # 15 blade.  Patient has foot cramps and discussed wearing powerstep insoles or good support in his shoes.     Return office visit   3 months                  Told patient to return for periodic foot care and evaluation due to potential at risk complications.   Gardiner Barefoot DPM

## 2021-11-17 ENCOUNTER — Telehealth: Payer: Self-pay | Admitting: Family Medicine

## 2021-11-17 NOTE — Telephone Encounter (Signed)
Pt called in regard to glipiZIDE (GLUCOTROL XL) 10 MG 24 hr tablet   Pt wanted to know if he could take an additional does of medication. Pt takes med in the morning and at noon and wants to take an evening dose due to blood sugar spikes    Pt wants confirmation

## 2021-11-18 ENCOUNTER — Other Ambulatory Visit: Payer: Self-pay | Admitting: Family Medicine

## 2021-11-18 ENCOUNTER — Other Ambulatory Visit: Payer: Self-pay | Admitting: *Deleted

## 2021-11-18 DIAGNOSIS — E1169 Type 2 diabetes mellitus with other specified complication: Secondary | ICD-10-CM

## 2021-11-18 MED ORDER — GLIPIZIDE ER 10 MG PO TB24: ORAL_TABLET | ORAL | 5 refills | Status: DC

## 2021-11-18 NOTE — Telephone Encounter (Signed)
I sent refill into pharmacy for patient. Should he make an appointment to discuss increase/change in meds due to sugar spikes?

## 2021-11-21 NOTE — Telephone Encounter (Signed)
Called and notified patient of providers recommendations, he verbalized understanding.

## 2021-11-25 ENCOUNTER — Telehealth: Payer: Self-pay

## 2021-11-25 ENCOUNTER — Other Ambulatory Visit: Payer: Self-pay

## 2021-11-25 MED ORDER — GLUCOSE BLOOD VI STRP: ORAL_STRIP | 12 refills | Status: DC

## 2021-11-25 MED ORDER — LANCETS 30G MISC: 5 refills | Status: DC

## 2021-11-25 MED ORDER — BLOOD GLUCOSE METER KIT: PACK | 0 refills | Status: DC

## 2021-11-25 NOTE — Telephone Encounter (Signed)
Patient called asked if clinic nurse will give him a call once the accu-chek is called into his pharmacy. [phone].

## 2021-11-25 NOTE — Telephone Encounter (Signed)
Patient called needs monitor accu-chek guide send to [facility]

## 2021-11-25 NOTE — Telephone Encounter (Signed)
Spoke with pt let him know I sent Rx to pharmacy

## 2021-11-29 ENCOUNTER — Ambulatory Visit: Payer: Medicare Other | Admitting: Internal Medicine

## 2021-12-01 DIAGNOSIS — J449 Chronic obstructive pulmonary disease, unspecified: Secondary | ICD-10-CM | POA: Diagnosis not present

## 2021-12-05 ENCOUNTER — Other Ambulatory Visit: Payer: Self-pay | Admitting: Family Medicine

## 2021-12-05 DIAGNOSIS — M25551 Pain in right hip: Secondary | ICD-10-CM

## 2021-12-07 ENCOUNTER — Other Ambulatory Visit: Payer: Medicare Other

## 2021-12-07 ENCOUNTER — Other Ambulatory Visit: Payer: Self-pay

## 2021-12-07 DIAGNOSIS — R972 Elevated prostate specific antigen [PSA]: Secondary | ICD-10-CM

## 2021-12-08 LAB — PSA: Prostate Specific Ag, Serum: 5.7 ng/mL — ABNORMAL HIGH (ref 0.0–4.0)

## 2021-12-14 ENCOUNTER — Encounter: Payer: Self-pay | Admitting: Urology

## 2021-12-14 ENCOUNTER — Ambulatory Visit (INDEPENDENT_AMBULATORY_CARE_PROVIDER_SITE_OTHER): Payer: Medicare Other | Admitting: Urology

## 2021-12-14 ENCOUNTER — Other Ambulatory Visit: Payer: Self-pay

## 2021-12-14 VITALS — BP 151/91 | HR 109

## 2021-12-14 DIAGNOSIS — R339 Retention of urine, unspecified: Secondary | ICD-10-CM | POA: Diagnosis not present

## 2021-12-14 DIAGNOSIS — N401 Enlarged prostate with lower urinary tract symptoms: Secondary | ICD-10-CM

## 2021-12-14 DIAGNOSIS — R972 Elevated prostate specific antigen [PSA]: Secondary | ICD-10-CM | POA: Diagnosis not present

## 2021-12-14 DIAGNOSIS — N138 Other obstructive and reflux uropathy: Secondary | ICD-10-CM

## 2021-12-14 LAB — BLADDER SCAN AMB NON-IMAGING

## 2021-12-14 NOTE — Progress Notes (Signed)
Urological Symptom Review  Patient is experiencing the following symptoms: none   Review of Systems  Gastrointestinal (upper)  : Negative for upper GI symptoms  Gastrointestinal (lower) : Negative for lower GI symptoms  Constitutional : Negative for symptoms  Skin: Negative for skin symptoms  Eyes: Negative for eye symptoms  Ear/Nose/Throat : Negative for Ear/Nose/Throat symptoms  Hematologic/Lymphatic: Negative for Hematologic/Lymphatic symptoms  Cardiovascular : Negative for cardiovascular symptoms  Respiratory : Negative for respiratory symptoms  Endocrine: Negative for endocrine symptoms  Musculoskeletal: Negative for musculoskeletal symptoms  Neurological: Headaches  Psychologic: Negative for psychiatric symptoms

## 2021-12-14 NOTE — Progress Notes (Signed)
Results printed and mailed.   

## 2021-12-14 NOTE — Progress Notes (Addendum)
12/14/2021 11:05 AM   Matthew Molina 1951-04-19 [id number]  Referring provider: Kerri Perches, MD [address], [address] [place],  [place] [address]  Chief Complaint  Patient presents with   Benign Prostatic Hypertrophy    HPI: 12/14/21 Pt is a 70YO with h/o elevated PSA and LUTS due to BPH. He is tolerating Flomax well and endorsed good stream. No dysuria, hematuria. PVR 117. IPSS 5 QOL 2 PSA- 5.7 09/05/21 9.7 04/05/21 9.9   09/12/21 [name] is a 70yo here for followup for incomplete emptying. PVR 119cc.  IPSS 12, QOL 2. Urine stream strong. Nocturia 1-2x. No urinary hesitancy, no straining to urinate. PSA stable at 9.7. No dysuria or hematuria. No other complaints today  PMH: Past Medical History:  Diagnosis Date   Acute blood loss anemia 01/05/2020   Acute kidney injury (HCC)    Acute respiratory failure with hypoxia (HCC)    Arthritis    COPD (chronic obstructive pulmonary disease) (HCC)    Depression    Diabetes mellitus    Diabetes mellitus without complication (HCC)    Diverticulitis    Elevated PSA 10/01/2016   Encounter for support and coordination of transition of care 01/14/2020   Hypercholesterolemia    Hyperlipidemia    Hypertension    Insomnia 01/05/2016   Ischemic colitis (HCC) 01/05/2020   Multiple lung nodules on CT 04/02/2015   Nicotine addiction    Obesity    Oxygen deficiency    qhs   Sch[facility]ia [facility])     Surgical History: Past Surgical History:  Procedure Laterality Date   CATARACT EXTRACTION W/PHACO Left 11/20/2013   Procedure: CATARACT EXTRACTION PHACO AND INTRAOCULAR LENS PLACEMENT (IOC);  Surgeon: Gemma Payor, MD;  Location: [facility];  Service: Ophthalmology;  Laterality: Left;  CDE:10.26   CATARACT EXTRACTION W/PHACO Right 12/08/2013   Procedure: RIGHT EYE CATARACT EXTRACTION PHACO AND INTRAOCULAR LENS PLACEMENT ;  Surgeon: Gemma Payor, MD;  Location: [facility];  Service: Ophthalmology;  Laterality: Right;  CDE 12.38   COLONOSCOPY N/A  04/01/2013   pancolonic diverticulosis, redundant colon, large internal hemorrhoids.    COLONOSCOPY  2014   INCOMPLETE PREP IN R COLON   COLONOSCOPY N/A 04/01/2020   Procedure: COLONOSCOPY;  Surgeon: West Bali, MD;  Location: [facility];  Service: Endoscopy;  Laterality: N/A;  8:45am   ESOPHAGOGASTRODUODENOSCOPY N/A 02/18/2016   stricture at GE junction, moderate erosive gastritis and mild non-erosive duodenitis. +H.pylori. Treated initially with Prevpac. Breath test to check for eradication was positive, and he was prescribed Pylera.    EYE SURGERY Left 11/2013   cataract extraction   POLYPECTOMY  04/01/2020   Procedure: POLYPECTOMY;  Surgeon: West Bali, MD;  Location: [facility];  Service: Endoscopy;;   SAVORY DILATION N/A 02/18/2016   Procedure: SAVORY DILATION;  Surgeon: West Bali, MD;  Location: [facility];  Service: Endoscopy;  Laterality: N/A;   SHOULDER SURGERY      Home Medications:  Allergies as of 12/14/2021       Reactions   Sertraline Hcl    Stomach upset/pain   Wellbutrin [bupropion] Other (See Comments)   Makes stomach hurt        Medication List        Accurate as of December 14, 2021 11:05 AM. If you have any questions, ask your nurse or doctor.          Accu-Chek Aviva Plus w/Device Kit accucheck aviva meter Once daily testing DX e11.9  Accu-Chek Guide test strip Generic drug: glucose blood USE AS DIRECTED TO CHECK BLOOD SUGAR ONCE DAILY.   glucose blood test strip Use as instructed   Accu-Chek Softclix Lancets lancets TEST BLOOD SUGAR ONCE DAILY AS DIRECTED.   Lancets 30G Misc Once daily testing dx e11.9   acetaminophen 500 MG tablet Commonly known as: TYLENOL Take 500-1,000 mg by mouth as needed for headache. Takes mostly for HA about 2 x/week.   albuterol 108 (90 Base) MCG/ACT inhaler Commonly known as: VENTOLIN HFA Inhale 2 puffs into the lungs every 6 (six) hours as needed for wheezing or shortness of breath.    blood glucose meter kit and supplies Dispense based on patient and insurance preference.Use to check blood sugar once daily (FOR ICD-10 E10.9, E11.9).   blood glucose meter kit and supplies Dispense based on patient and insurance preference. Once daily testing DX E11.9   budesonide-formoterol 160-4.5 MCG/ACT inhaler Commonly known as: Symbicort Inhale 2 puffs into the lungs in the morning and at bedtime.   busPIRone 7.5 MG tablet Commonly known as: BUSPAR Take 1 tablet (7.5 mg total) by mouth 3 (three) times daily.   docusate sodium 100 MG capsule Commonly known as: COLACE Take 100 mg by mouth daily as needed for mild constipation.   ezetimibe 10 MG tablet Commonly known as: ZETIA Take 1 tablet (10 mg total) by mouth daily.   glipiZIDE 10 MG 24 hr tablet Commonly known as: GLUCOTROL XL Take two tablets by mouth every morning at breakfast   ipratropium-albuterol 0.5-2.5 (3) MG/3ML Soln Commonly known as: DUONEB USE 1 VIAL VIA NEBULIZER EVERY 4-6 HOURS.   meloxicam 7.5 MG tablet Commonly known as: MOBIC TAKE 1 TABLET BY MOUTH ONCE DAILY.   mupirocin cream 2 % Commonly known as: Bactroban Apply 1 application topically 2 (two) times daily.   mupirocin ointment 2 % Commonly known as: BACTROBAN Apply topically 2 (two) times daily.   olmesartan 40 MG tablet Commonly known as: BENICAR Take 1 tablet (40 mg total) by mouth daily.   pantoprazole 40 MG tablet Commonly known as: PROTONIX TAKE ONE TABLET BY MOUTH ONCE DAILY.   risperidone 4 MG tablet Commonly known as: RISPERDAL TAKE ONE TABLET BY MOUTH IN THE EVENING. TAKE WITH $RemoveB'1MG'eXEmCRXK$  TABLET.   risperiDONE 1 MG tablet Commonly known as: RISPERDAL TAKE ONE TABLET BY MOUTH IN THE EVENING. TAKE WITH $RemoveB'4MG'jdDOysqR$  TABLET.   sucralfate 1 g tablet Commonly known as: CARAFATE TAKE 1 TABLET BY MOUTH FOUR TIMES A DAY AS NEEDED.   tamsulosin 0.4 MG Caps capsule Commonly known as: FLOMAX Take 1 capsule (0.4 mg total) by mouth daily after  supper.   traZODone 50 MG tablet Commonly known as: DESYREL Take 1 tablet (50 mg total) by mouth at bedtime as needed. for sleep   triamterene-hydrochlorothiazide 37.5-25 MG tablet Commonly known as: MAXZIDE-25 TAKE 1/2 TABLET BY MOUTH ONCE DAILY.   UNABLE TO FIND Nebulizer mouthpiece and tubing x 1  DX COPD   UNABLE TO FIND XL pullups for daily use DX Incontinence   UNABLE TO FIND Nebulizer machine Mask and tubing x 1  DX J44.9        Allergies:  Allergies  Allergen Reactions   Sertraline Hcl     Stomach upset/pain   Wellbutrin [Bupropion] Other (See Comments)    Makes stomach hurt    Family History: Family History  Problem Relation Age of Onset   Diabetes Mother    Hypertension Mother    Stroke Mother  Diabetes Sister    Heart disease Sister    Kidney disease Father    Drug abuse Brother    Colon cancer Neg Hx    Colon polyps Neg Hx     Social History:  reports that he has been smoking cigarettes. He has a 96.00 pack-year smoking history. He has never used smokeless tobacco. He reports current alcohol use. He reports that he does not use drugs.  ROS: All other review of systems were reviewed and are negative except what is noted above in HPI  Physical Exam: BP (!) 151/91    Pulse (!) 109   Constitutional:  Alert and oriented, No acute distress. HEENT: [place] AT, moist mucus membranes.  Trachea midline Cardiovascular: No clubbing, cyanosis, or edema. Respiratory: Normal respiratory effort, no increased work of breathing. GI: Abdomen is soft, nontender, nondistended, no abdominal masses GU: No CVA tenderness.  Skin: No rashes, bruises or suspicious lesions. Neurologic: Grossly intact, no focal deficits, moving all 4 extremities. Psychiatric: Normal mood and affect.  Laboratory Data: Lab Results  Component Value Date   WBC 10.5 09/03/2021   HGB 14.1 09/03/2021   HCT 43.0 09/03/2021   MCV 92.3 09/03/2021   PLT 225 09/03/2021    Lab Results   Component Value Date   CREATININE 1.52 (H) 09/03/2021    Lab Results  Component Value Date   PSA 9.3 (H) 05/26/2020   PSA 5.9 (H) 07/05/2017   PSA 4.96 (H) 06/05/2016    No results found for: TESTOSTERONE  Lab Results  Component Value Date   HGBA1C 8.9 (H) 06/23/2021    Urinalysis    Component Value Date/Time   COLORURINE lt. yellow 02/25/2009 1257   APPEARANCEUR Clear 09/08/2020 1206   LABSPEC 1.010 02/25/2009 1257   PHURINE 6.0 02/25/2009 1257   GLUCOSEU Negative 09/08/2020 1206   HGBUR negative 02/25/2009 1257   BILIRUBINUR Negative 09/08/2020 1206   PROTEINUR Negative 09/08/2020 1206   UROBILINOGEN 0.2 02/25/2009 1257   NITRITE Negative 09/08/2020 1206   NITRITE negative 02/25/2009 1257   LEUKOCYTESUR Negative 09/08/2020 1206    Lab Results  Component Value Date   LABMICR 69.4 09/15/2021    Pertinent Imaging:  No results found for this or any previous visit.  No results found for this or any previous visit.  No results found for this or any previous visit.  No results found for this or any previous visit.  Results for orders placed during the hospital encounter of 07/20/14  US Renal  Narrative CLINICAL DATA:  Evaluate for renal lesions or cysts.  EXAM: RENAL/URINARY TRACT ULTRASOUND COMPLETE  COMPARISON:  Chest CT 07/14/2014  FINDINGS: Right Kidney:  Length: 16.0 cm. There are multiple hypoechoic and anechoic structures in the right kidney that are suggestive for renal cysts. No evidence for hydronephrosis. Index cyst in the mid right kidney measures 3.5 x 4.4 x 3.8 cm. This appears to be a simple cyst with acoustic enhancement. Anechoic cyst in the upper pole measures 4.0 x 3.7 x 3.7 cm. The lower pole appears to be lobulated with small cysts.  Left Kidney:  Length: 13.4 cm. Question at least one echogenic focus in the left kidney mid pole which could represent a nonobstructive stone. No evidence for hydronephrosis. Question a small  cyst in the midpole region.  Bladder:  Normal appearance of the urinary bladder. Bilateral ureter jets are present.  IMPRESSION: Multiple right renal cysts. Largest right renal cyst measures up to 4.4 cm. No suspicious renal lesions.  Cannot exclude a nonobstructive left kidney stone.   Electronically Signed By: Markus Daft M.D. On: 07/20/2014 14:05  No results found for this or any previous visit.  No results found for this or any previous visit.  No results found for this or any previous visit.   Assessment & Plan:    1. Elevated PSA - Urinalysis, Routine w reflex microscopic - PSA; Future  2. Incomplete emptying of bladder - BLADDER SCAN AMB NON-IMAGING - Conitnue Flomax 3. Benign prostatic hyperplasia with urinary obstruction Symptoms improved. Continue meds and FU in 6 months - PSA; Future   Return in about 6 months (around 06/14/2022).  [place], Berneice Heinrich, PA-C  [facility] [place]

## 2021-12-14 NOTE — Progress Notes (Signed)
post void residual=114ml

## 2021-12-19 ENCOUNTER — Other Ambulatory Visit: Payer: Self-pay | Admitting: Family Medicine

## 2021-12-28 ENCOUNTER — Ambulatory Visit (INDEPENDENT_AMBULATORY_CARE_PROVIDER_SITE_OTHER): Payer: Medicare Other | Admitting: Family Medicine

## 2021-12-28 ENCOUNTER — Other Ambulatory Visit: Payer: Self-pay

## 2021-12-28 ENCOUNTER — Encounter: Payer: Self-pay | Admitting: Family Medicine

## 2021-12-28 ENCOUNTER — Encounter (INDEPENDENT_AMBULATORY_CARE_PROVIDER_SITE_OTHER): Payer: Self-pay

## 2021-12-28 VITALS — BP 146/85 | Ht 73.0 in | Wt 245.0 lb

## 2021-12-28 DIAGNOSIS — Z720 Tobacco use: Secondary | ICD-10-CM | POA: Diagnosis not present

## 2021-12-28 DIAGNOSIS — I10 Essential (primary) hypertension: Secondary | ICD-10-CM | POA: Diagnosis not present

## 2021-12-28 DIAGNOSIS — F17210 Nicotine dependence, cigarettes, uncomplicated: Secondary | ICD-10-CM | POA: Diagnosis not present

## 2021-12-28 DIAGNOSIS — J42 Unspecified chronic bronchitis: Secondary | ICD-10-CM | POA: Diagnosis not present

## 2021-12-28 DIAGNOSIS — J309 Allergic rhinitis, unspecified: Secondary | ICD-10-CM | POA: Diagnosis not present

## 2021-12-28 DIAGNOSIS — E669 Obesity, unspecified: Secondary | ICD-10-CM

## 2021-12-28 MED ORDER — BENZONATATE 100 MG PO CAPS: 100.0000 mg | ORAL_CAPSULE | Freq: Two times a day (BID) | ORAL | 0 refills | Status: DC | PRN

## 2021-12-28 MED ORDER — MONTELUKAST SODIUM 10 MG PO TABS: 10.0000 mg | ORAL_TABLET | Freq: Every day | ORAL | 2 refills | Status: DC

## 2021-12-28 NOTE — Patient Instructions (Signed)
F/u as before , call if you need  me sooner.  Two medications, tessalon perles and montelukast have been prescribed  Use tylenol up to 3 times daily for body aches and ensure you dringk 64 oz water daily  Thanks for choosing [facility], we consider it a privelige to serve you.

## 2021-12-28 NOTE — Progress Notes (Signed)
Virtual Visit via Telephone Note  I connected with Matthew Molina on 12/28/21 at  9:40 AM EST by telephone and verified that I am speaking with the correct person using two identifiers.  Location: Patient: home Provider: office   I discussed the limitations, risks, security and privacy concerns of performing an evaluation and management service by telephone and the availability of in person appointments. I also discussed with the patient that there may be a patient responsible charge related to this service. The patient expressed understanding and agreed to proceed.   History of Present Illness: 4 day h/o head congestion with clear nasal drainage and watery eyes and pressure behind the eyes. Recent covid test is negative   Observations/Objective: BP (!) 146/85    Ht 6\' 1"  (1.854 m)    Wt 245 lb (111.1 kg)    BMI 32.32 kg/m  Good communication with no confusion and intact memory. Alert and oriented x 3 No signs of respiratory distress during speech   Assessment and Plan: Hypertension DASH diet and commitment to daily physical activity for a minimum of 30 minutes discussed and encouraged, as a part of hypertension management. The importance of attaining a healthy weight is also discussed. Will need med increase will address at in office visit, uncontrolled  BP/Weight 12/28/2021 12/14/2021 09/15/2021 09/12/2021 09/03/2021 06/11/6388 02/22/3427  Systolic BP 768 115 726 203 559 741 638  Diastolic BP 85 91 84 71 88 90 80  Wt. (Lbs) 245 - 247 - 240 245.6 250  BMI 32.32 - 32.59 - 31.66 32.4 32.98  Some encounter information is confidential and restricted. Go to Review Flowsheets activity to see all data.       Tobacco abuse Asked:confirms currently smokes cigarettes Assess: Unwilling to set a quit date, but is trying to cut back Advise: needs to QUIT to reduce risk of cancer, cardio and cerebrovascular disease Assist: counseled for 5 minutes and literature provided Arrange: follow up in  2 to 4 months   COPD (chronic obstructive pulmonary disease) ([place]) Progressing due to ongoing nicotine, counseled to quit. Short term tessalon perles and daily Singulair added for symptom relief   Multiple environmental allergies Current flare , start daiy Singulair and allergen avoidance if possible  Obesity (BMI 30.0-34.9)  Patient re-educated about  the importance of commitment to a  minimum of 150 minutes of exercise per week as able.  The importance of healthy food choices with portion control discussed, as well as eating regularly and within a 12 hour window most days. The need to choose "clean , green" food 50 to 75% of the time is discussed, as well as to make water the primary drink and set a goal of 64 ounces water daily.    Weight /BMI 12/28/2021 09/15/2021 09/03/2021  WEIGHT 245 lb 247 lb 240 lb  HEIGHT 6\' 1"  6\' 1"  6\' 1"   BMI 32.32 kg/m2 32.59 kg/m2 31.66 kg/m2  Some encounter information is confidential and restricted. Go to Review Flowsheets activity to see all data.       Follow Up Instructions:    I discussed the assessment and treatment plan with the patient. The patient was provided an opportunity to ask questions and all were answered. The patient agreed with the plan and demonstrated an understanding of the instructions.   The patient was advised to call back or seek an in-person evaluation if the symptoms worsen or if the condition fails to improve as anticipated.  I provided 22 minutes of non-face-to-face time during  this encounter.   [name], MD

## 2022-01-01 DIAGNOSIS — J449 Chronic obstructive pulmonary disease, unspecified: Secondary | ICD-10-CM | POA: Diagnosis not present

## 2022-01-02 ENCOUNTER — Encounter: Payer: Self-pay | Admitting: Family Medicine

## 2022-01-02 ENCOUNTER — Ambulatory Visit: Payer: Medicare Other | Admitting: Gastroenterology

## 2022-01-02 DIAGNOSIS — Z9109 Other allergy status, other than to drugs and biological substances: Secondary | ICD-10-CM | POA: Insufficient documentation

## 2022-01-02 NOTE — Assessment & Plan Note (Signed)
Current flare , start daiy Singulair and allergen avoidance if possible

## 2022-01-02 NOTE — Assessment & Plan Note (Signed)
Asked:confirms currently smokes cigarettes Assess: Unwilling to set a quit date, but is trying to cut back Advise: needs to QUIT to reduce risk of cancer, cardio and cerebrovascular disease Assist: counseled for 5 minutes and literature provided Arrange: follow up in 2 to 4 months  

## 2022-01-02 NOTE — Assessment & Plan Note (Signed)
°  Patient re-educated about  the importance of commitment to a  minimum of 150 minutes of exercise per week as able.  The importance of healthy food choices with portion control discussed, as well as eating regularly and within a 12 hour window most days. The need to choose "clean , green" food 50 to 75% of the time is discussed, as well as to make water the primary drink and set a goal of 64 ounces water daily.    Weight /BMI 12/28/2021 09/15/2021 09/03/2021  WEIGHT 245 lb 247 lb 240 lb  HEIGHT 6\' 1"  6\' 1"  6\' 1"   BMI 32.32 kg/m2 32.59 kg/m2 31.66 kg/m2  Some encounter information is confidential and restricted. Go to Review Flowsheets activity to see all data.

## 2022-01-02 NOTE — Assessment & Plan Note (Addendum)
Progressing due to ongoing nicotine, counseled to quit. Short term tessalon perles and daily Singulair added for symptom relief

## 2022-01-02 NOTE — Assessment & Plan Note (Signed)
DASH diet and commitment to daily physical activity for a minimum of 30 minutes discussed and encouraged, as a part of hypertension management. The importance of attaining a healthy weight is also discussed. Will need med increase will address at in office visit, uncontrolled  BP/Weight 12/28/2021 12/14/2021 09/15/2021 09/12/2021 09/03/2021 0/76/2263 02/18/5455  Systolic BP 256 389 373 428 768 115 726  Diastolic BP 85 91 84 71 88 90 80  Wt. (Lbs) 245 - 247 - 240 245.6 250  BMI 32.32 - 32.59 - 31.66 32.4 32.98  Some encounter information is confidential and restricted. Go to Review Flowsheets activity to see all data.

## 2022-01-03 ENCOUNTER — Other Ambulatory Visit: Payer: Self-pay | Admitting: Family Medicine

## 2022-01-03 DIAGNOSIS — M25551 Pain in right hip: Secondary | ICD-10-CM

## 2022-01-04 ENCOUNTER — Other Ambulatory Visit: Payer: Self-pay

## 2022-01-04 ENCOUNTER — Ambulatory Visit (INDEPENDENT_AMBULATORY_CARE_PROVIDER_SITE_OTHER): Payer: Medicare Other | Admitting: Family Medicine

## 2022-01-04 DIAGNOSIS — Z720 Tobacco use: Secondary | ICD-10-CM

## 2022-01-04 DIAGNOSIS — E1169 Type 2 diabetes mellitus with other specified complication: Secondary | ICD-10-CM

## 2022-01-04 DIAGNOSIS — I10 Essential (primary) hypertension: Secondary | ICD-10-CM

## 2022-01-04 DIAGNOSIS — E785 Hyperlipidemia, unspecified: Secondary | ICD-10-CM

## 2022-01-04 DIAGNOSIS — E669 Obesity, unspecified: Secondary | ICD-10-CM

## 2022-01-04 DIAGNOSIS — J209 Acute bronchitis, unspecified: Secondary | ICD-10-CM | POA: Diagnosis not present

## 2022-01-04 DIAGNOSIS — J440 Chronic obstructive pulmonary disease with acute lower respiratory infection: Secondary | ICD-10-CM | POA: Diagnosis not present

## 2022-01-04 MED ORDER — PENICILLIN V POTASSIUM 500 MG PO TABS: 500.0000 mg | ORAL_TABLET | Freq: Three times a day (TID) | ORAL | 0 refills | Status: DC

## 2022-01-04 MED ORDER — PSEUDOEPH-BROMPHEN-DM 30-2-10 MG/5ML PO SYRP: 2.5000 mL | ORAL_SOLUTION | Freq: Four times a day (QID) | ORAL | 0 refills | Status: DC | PRN

## 2022-01-04 NOTE — Patient Instructions (Addendum)
F/U in 3 weeks, call if you need me before ° °CXR today ° °hBA1C, cbc, cmp and EGFR, Lipid today at hospital ° °You are treated for bronchitis, penicillin and cough suppressant are prescribed ° °Work on quitting smpoking please ° °Thanks for choosing [facility], we consider it a privelige to serve you. ° ° ° °

## 2022-01-04 NOTE — Progress Notes (Signed)
Virtual Visit via Telephone Note  I connected with Matthew Molina on 01/04/22 at 10:00 AM EST by telephone and verified that I am speaking with the correct person using two identifiers.  Location: Patient: home Provider: office   I discussed the limitations, risks, security and privacy concerns of performing an evaluation and management service by telephone and the availability of in person appointments. I also discussed with the patient that there may be a patient responsible charge related to this service. The patient expressed understanding and agreed to proceed.   History of Present Illness: 1 week h/o persistent cough, body achs, chill, unable to produce sputum, denies dyspnea, feels hot but no documented fever   Observations/Objective: There were no vitals taken for this visit. Good communication with no confusion and intact memory. Alert and oriented x 3 Cough during visit and nasal congestion  Assessment and Plan:  Acute bronchitis with COPD ([place]) CXR, cough suppressant , antibiotic and smoking cessation counselling  Tobacco abuse Asked:confirms currently smokes cigarettes 1PPD Assess: Unwilling to set a quit date,trying to  cut back Advise: needs to QUIT to reduce risk of cancer, cardio and cerebrovascular disease Assist: counseled for 5 minutes and literature provided Arrange: follow up in 2 to 4 months   Obesity (BMI 30.0-34.9)  Patient re-educated about  the importance of commitment to a  minimum of 150 minutes of exercise per week as able.  The importance of healthy food choices with portion control discussed, as well as eating regularly and within a 12 hour window most days. The need to choose "clean , green" food 50 to 75% of the time is discussed, as well as to make water the primary drink and set a goal of 64 ounces water daily.    Weight /BMI 12/28/2021 09/15/2021 09/03/2021  WEIGHT 245 lb 247 lb 240 lb  HEIGHT 6\' 1"  6\' 1"  6\' 1"   BMI 32.32 kg/m2 32.59 kg/m2  31.66 kg/m2  Some encounter information is confidential and restricted. Go to Review Flowsheets activity to see all data.      Follow Up Instructions:    I discussed the assessment and treatment plan with the patient. The patient was provided an opportunity to ask questions and all were answered. The patient agreed with the plan and demonstrated an understanding of the instructions.   The patient was advised to call back or seek an in-person evaluation if the symptoms worsen or if the condition fails to improve as anticipated.  I provided 15 minutes of non-face-to-face time during this encounter.   [name], MD

## 2022-01-05 ENCOUNTER — Other Ambulatory Visit (HOSPITAL_COMMUNITY)
Admission: RE | Admit: 2022-01-05 | Discharge: 2022-01-05 | Disposition: A | Discharge status: Home / Self Care | Payer: Medicare Other | Source: Ambulatory Visit | Attending: Family Medicine | Admitting: Family Medicine

## 2022-01-05 ENCOUNTER — Other Ambulatory Visit: Payer: Self-pay

## 2022-01-05 ENCOUNTER — Ambulatory Visit (HOSPITAL_COMMUNITY)
Admission: RE | Admit: 2022-01-05 | Discharge: 2022-01-05 | Disposition: A | Discharge status: Home / Self Care | Payer: Medicare Other | Source: Ambulatory Visit | Attending: Family Medicine | Admitting: Family Medicine

## 2022-01-05 DIAGNOSIS — E1169 Type 2 diabetes mellitus with other specified complication: Secondary | ICD-10-CM | POA: Insufficient documentation

## 2022-01-05 DIAGNOSIS — J209 Acute bronchitis, unspecified: Secondary | ICD-10-CM | POA: Insufficient documentation

## 2022-01-05 DIAGNOSIS — J449 Chronic obstructive pulmonary disease, unspecified: Secondary | ICD-10-CM | POA: Diagnosis not present

## 2022-01-05 DIAGNOSIS — E785 Hyperlipidemia, unspecified: Secondary | ICD-10-CM | POA: Insufficient documentation

## 2022-01-05 DIAGNOSIS — I10 Essential (primary) hypertension: Secondary | ICD-10-CM | POA: Insufficient documentation

## 2022-01-05 DIAGNOSIS — E669 Obesity, unspecified: Secondary | ICD-10-CM | POA: Insufficient documentation

## 2022-01-05 DIAGNOSIS — R059 Cough, unspecified: Secondary | ICD-10-CM | POA: Diagnosis not present

## 2022-01-05 DIAGNOSIS — J440 Chronic obstructive pulmonary disease with acute lower respiratory infection: Secondary | ICD-10-CM | POA: Diagnosis not present

## 2022-01-05 LAB — CBC WITH DIFFERENTIAL/PLATELET
Abs Immature Granulocytes: 0.03 10*3/uL (ref 0.00–0.07)
Basophils Absolute: 0 10*3/uL (ref 0.0–0.1)
Basophils Relative: 1 %
Eosinophils Absolute: 0.1 10*3/uL (ref 0.0–0.5)
Eosinophils Relative: 2 %
HCT: 43.9 % (ref 39.0–52.0)
Hemoglobin: 14.1 g/dL (ref 13.0–17.0)
Immature Granulocytes: 1 %
Lymphocytes Relative: 30 %
Lymphs Abs: 2 10*3/uL (ref 0.7–4.0)
MCH: 29.6 pg (ref 26.0–34.0)
MCHC: 32.1 g/dL (ref 30.0–36.0)
MCV: 92.2 fL (ref 80.0–100.0)
Monocytes Absolute: 0.5 10*3/uL (ref 0.1–1.0)
Monocytes Relative: 7 %
Neutro Abs: 3.9 10*3/uL (ref 1.7–7.7)
Neutrophils Relative %: 59 %
Platelets: 204 10*3/uL (ref 150–400)
RBC: 4.76 MIL/uL (ref 4.22–5.81)
RDW: 13.3 % (ref 11.5–15.5)
WBC: 6.4 10*3/uL (ref 4.0–10.5)
nRBC: 0 % (ref 0.0–0.2)

## 2022-01-05 LAB — COMPREHENSIVE METABOLIC PANEL
ALT: 61 U/L — ABNORMAL HIGH (ref 0–44)
AST: 38 U/L (ref 15–41)
Albumin: 4.3 g/dL (ref 3.5–5.0)
Alkaline Phosphatase: 43 U/L (ref 38–126)
Anion gap: 10 (ref 5–15)
BUN: 12 mg/dL (ref 8–23)
CO2: 25 mmol/L (ref 22–32)
Calcium: 10.3 mg/dL (ref 8.9–10.3)
Chloride: 100 mmol/L (ref 98–111)
Creatinine, Ser: 1.38 mg/dL — ABNORMAL HIGH (ref 0.61–1.24)
GFR, Estimated: 54 mL/min — ABNORMAL LOW (ref >60)
Glucose, Bld: 138 mg/dL — ABNORMAL HIGH (ref 70–99)
Potassium: 3.4 mmol/L — ABNORMAL LOW (ref 3.5–5.1)
Sodium: 135 mmol/L (ref 135–145)
Total Bilirubin: 0.7 mg/dL (ref 0.3–1.2)
Total Protein: 7.7 g/dL (ref 6.5–8.1)

## 2022-01-05 LAB — LIPID PANEL
Cholesterol: 197 mg/dL (ref 0–200)
HDL: 39 mg/dL — ABNORMAL LOW (ref >40)
LDL Cholesterol: 86 mg/dL (ref 0–99)
Total CHOL/HDL Ratio: 5.1 RATIO
Triglycerides: 360 mg/dL — ABNORMAL HIGH (ref <150)
VLDL: 72 mg/dL — ABNORMAL HIGH (ref 0–40)

## 2022-01-05 MED ORDER — FENOFIBRATE 145 MG PO TABS: 145.0000 mg | ORAL_TABLET | Freq: Every day | ORAL | 3 refills | Status: DC

## 2022-01-06 LAB — HEMOGLOBIN A1C
Hgb A1c MFr Bld: 8 % — ABNORMAL HIGH (ref 4.8–5.6)
Mean Plasma Glucose: 183 mg/dL

## 2022-01-07 ENCOUNTER — Encounter: Payer: Self-pay | Admitting: Family Medicine

## 2022-01-07 DIAGNOSIS — J209 Acute bronchitis, unspecified: Secondary | ICD-10-CM | POA: Insufficient documentation

## 2022-01-07 DIAGNOSIS — J440 Chronic obstructive pulmonary disease with acute lower respiratory infection: Secondary | ICD-10-CM | POA: Insufficient documentation

## 2022-01-07 NOTE — Assessment & Plan Note (Addendum)
CXR, cough suppressant , antibiotic and smoking cessation counselling

## 2022-01-07 NOTE — Assessment & Plan Note (Signed)
Asked:confirms currently smokes cigarettes 1PPD Assess: Unwilling to set a quit date,trying to  cut back Advise: needs to QUIT to reduce risk of cancer, cardio and cerebrovascular disease Assist: counseled for 5 minutes and literature provided Arrange: follow up in 2 to 4 months

## 2022-01-07 NOTE — Assessment & Plan Note (Signed)
°  Patient re-educated about  the importance of commitment to a  minimum of 150 minutes of exercise per week as able.  The importance of healthy food choices with portion control discussed, as well as eating regularly and within a 12 hour window most days. The need to choose "clean , green" food 50 to 75% of the time is discussed, as well as to make water the primary drink and set a goal of 64 ounces water daily.    Weight /BMI 12/28/2021 09/15/2021 09/03/2021  WEIGHT 245 lb 247 lb 240 lb  HEIGHT 6\' 1"  6\' 1"  6\' 1"   BMI 32.32 kg/m2 32.59 kg/m2 31.66 kg/m2  Some encounter information is confidential and restricted. Go to Review Flowsheets activity to see all data.

## 2022-01-23 ENCOUNTER — Telehealth: Payer: Self-pay | Admitting: Family Medicine

## 2022-01-23 ENCOUNTER — Other Ambulatory Visit (HOSPITAL_COMMUNITY): Payer: Self-pay

## 2022-01-23 DIAGNOSIS — Z122 Encounter for screening for malignant neoplasm of respiratory organs: Secondary | ICD-10-CM

## 2022-01-23 DIAGNOSIS — Z87891 Personal history of nicotine dependence: Secondary | ICD-10-CM

## 2022-01-23 NOTE — Progress Notes (Signed)
LDCT order placed per protocol. LDCT scheduled 03/16 at 1000.

## 2022-01-23 NOTE — Telephone Encounter (Signed)
Patient aware lungs clear no pneumonia

## 2022-01-23 NOTE — Telephone Encounter (Signed)
Pt called to return call   Pt received VM to call office back on Friday 2/3

## 2022-01-25 ENCOUNTER — Other Ambulatory Visit: Payer: Self-pay | Admitting: Family Medicine

## 2022-01-25 DIAGNOSIS — F209 Schizophrenia, unspecified: Secondary | ICD-10-CM

## 2022-01-27 ENCOUNTER — Ambulatory Visit (INDEPENDENT_AMBULATORY_CARE_PROVIDER_SITE_OTHER): Payer: Medicare Other | Admitting: Family Medicine

## 2022-01-27 ENCOUNTER — Other Ambulatory Visit: Payer: Self-pay

## 2022-01-27 ENCOUNTER — Encounter: Payer: Self-pay | Admitting: Family Medicine

## 2022-01-27 VITALS — BP 136/84 | HR 101 | Resp 16 | Ht 73.0 in | Wt 241.1 lb

## 2022-01-27 DIAGNOSIS — E785 Hyperlipidemia, unspecified: Secondary | ICD-10-CM

## 2022-01-27 DIAGNOSIS — F17210 Nicotine dependence, cigarettes, uncomplicated: Secondary | ICD-10-CM

## 2022-01-27 DIAGNOSIS — M79601 Pain in right arm: Secondary | ICD-10-CM

## 2022-01-27 DIAGNOSIS — F209 Schizophrenia, unspecified: Secondary | ICD-10-CM

## 2022-01-27 DIAGNOSIS — Z720 Tobacco use: Secondary | ICD-10-CM | POA: Diagnosis not present

## 2022-01-27 DIAGNOSIS — M79603 Pain in arm, unspecified: Secondary | ICD-10-CM | POA: Insufficient documentation

## 2022-01-27 DIAGNOSIS — M79602 Pain in left arm: Secondary | ICD-10-CM

## 2022-01-27 DIAGNOSIS — J42 Unspecified chronic bronchitis: Secondary | ICD-10-CM | POA: Diagnosis not present

## 2022-01-27 DIAGNOSIS — E1169 Type 2 diabetes mellitus with other specified complication: Secondary | ICD-10-CM | POA: Diagnosis not present

## 2022-01-27 DIAGNOSIS — E669 Obesity, unspecified: Secondary | ICD-10-CM

## 2022-01-27 DIAGNOSIS — I10 Essential (primary) hypertension: Secondary | ICD-10-CM

## 2022-01-27 MED ORDER — METFORMIN HCL 500 MG PO TABS: 500.0000 mg | ORAL_TABLET | Freq: Every day | ORAL | 5 refills | Status: DC

## 2022-01-27 MED ORDER — ROSUVASTATIN CALCIUM 5 MG PO TABS: ORAL_TABLET | ORAL | 1 refills | Status: DC

## 2022-01-27 MED ORDER — EMPAGLIFLOZIN 10 MG PO TABS: 10.0000 mg | ORAL_TABLET | Freq: Every day | ORAL | 5 refills | Status: DC

## 2022-01-27 MED ORDER — KETOROLAC TROMETHAMINE 60 MG/2ML IM SOLN
60.0000 mg | Freq: Once | INTRAMUSCULAR | Status: AC
  Administered 2022-01-27: 60 mg via INTRAMUSCULAR

## 2022-01-27 NOTE — Patient Instructions (Addendum)
Follow-up in 14 weeks call if you need me sooner.  HBA1C, fasting cmp and eGFR and lipid panel 5 days before next visit  Non fasting cmp and EGFR in 6 weeks  You need to reduce fried and fatty foods egg yolks cheese ,butter your triglycerides are high.  You need to reduce fruit juice sweetened drinks cakes cookies ice cream.  Your sugar has improved but is still too high.   Continue glipizide 10 mg 2 tablets every day.  New additional medication for blood sugar is metformin 500 mg 1 daily and Jardiance 10 mg 1 daily.  New additional medicine for high cholesterol is crestor 5 mg one tablet 3 times weekly, Mon, wed, Friday  Toradol 60 mg IM in the office today for back and arm pain.  Work on cutting back smoking with the plan to quit.   You now smoked in 20 cigarettes/day cut back by 1 cigarette/week if possible or at least 2 cigarettes/month.

## 2022-01-30 ENCOUNTER — Encounter: Payer: Self-pay | Admitting: Family Medicine

## 2022-01-30 NOTE — Assessment & Plan Note (Signed)
Stable and controlled on current meds, denies auditory or visual hallucinations , no med change

## 2022-01-30 NOTE — Progress Notes (Signed)
Matthew Molina     MRN: [id number]      DOB: 03/20/1951   HPI Matthew Molina is here for follow up and re-evaluation of chronic medical conditions, medication management and review of any available recent lab and radiology data.  Preventive health is updated, specifically  Cancer screening and Immunization.   Questions or concerns regarding consultations or procedures which the PT has had in the interim are  addressed. The PT denies any adverse reactions to current medications since the last visit.  C/o bilateral upper arm pain which limits movement, requests injections for this Denies polyuria, polydipsia, blurred vision , or hypoglycemic episodes. Still smokes 1 PPD , no quit date in mind  ROS Denies recent fever or chills. Denies sinus pressure, nasal congestion, ear pain or sore throat. Denies chest congestion, productive cough or wheezing. Denies chest pains, palpitations and leg swelling Denies abdominal pain, nausea, vomiting,diarrhea or constipation.   Denies dysuria, frequency, hesitancy or incontinence.  Denies headaches, seizures, numbness, or tingling. Denies uncontrolled depression, anxiety or insomnia. Denies skin break down or rash.   PE  BP 136/84    Pulse (!) 101    Resp 16    Ht 6\' 1"  (1.854 m)    Wt 241 lb 1.9 oz (109.4 kg)    SpO2 94%    BMI 31.81 kg/m   Patient alert and oriented and in no cardiopulmonary distress.  HEENT: No facial asymmetry, EOMI,     Neck supple .  Chest: Clear to auscultation bilaterally.  CVS: S1, S2 no murmurs, no S3.Regular rate.  ABD: Soft non tender.   Ext: No edema  MS: decreased  ROM spine, shoulders, hips and knees.  Skin: Intact, no ulcerations or rash noted.  Psych: Good eye contact, normal affect. Memory intact not anxious or depressed appearing.  CNS: CN 2-12 intact, power,  normal throughout.no focal deficits noted.   Assessment & Plan  Schizophrenia, unspecified type ([place]) Stable and controlled on current  meds, denies auditory or visual hallucinations , no med change  Hypertension Controlled, no change in medication DASH diet and commitment to daily physical activity for a minimum of 30 minutes discussed and encouraged, as a part of hypertension management. The importance of attaining a healthy weight is also discussed.  BP/Weight 01/27/2022 12/28/2021 12/14/2021 09/15/2021 09/12/2021 09/03/2021 3/41/9379  Systolic BP 024 097 353 299 242 683 419  Diastolic BP 84 85 91 84 71 88 90  Wt. (Lbs) 241.12 245 - 247 - 240 245.6  BMI 31.81 32.32 - 32.59 - 31.66 32.4  Some encounter information is confidential and restricted. Go to Review Flowsheets activity to see all data.       Diabetes mellitus type 2 in obese [facility]) Matthew Molina is reminded of the importance of commitment to daily physical activity for 30 minutes or more, as able and the need to limit carbohydrate intake to 30 to 60 grams per meal to help with blood sugar control.   The need to take medication as prescribed, test blood sugar as directed, and to call between visits if there is a concern that blood sugar is uncontrolled is also discussed.   Matthew Molina is reminded of the importance of daily foot exam, annual eye examination, and good blood sugar, blood pressure and cholesterol control.  Improved but still not at goal, updated lab in April, reports improved numbers and better dietary compliance Diabetic Labs Latest Ref Rng & Units 01/05/2022 09/15/2021 09/03/2021 06/23/2021 01/25/2021  HbA1c 4.8 -  5.6 % 8.0(H) - - 8.9(H) 7.3(H)  Microalbumin Not Estab. ug/mL - - - - -  Micro/Creat Ratio 0 - 29 mg/g creat - 54(H) - - -  Chol 0 - 200 mg/dL 197 - - - 94(L)  HDL >40 mg/dL 39(L) - - - 39(L)  Calc LDL 0 - 99 mg/dL 86 - - - 28  Triglycerides <150 mg/dL 360(H) - - - 163(H)  Creatinine 0.61 - 1.24 mg/dL 1.38(H) - 1.52(H) 1.30(H) 1.37(H)   BP/Weight 01/27/2022 12/28/2021 12/14/2021 09/15/2021 09/12/2021 09/03/2021 9/70/2637  Systolic BP 858 850 277 412  878 676 720  Diastolic BP 84 85 91 84 71 88 90  Wt. (Lbs) 241.12 245 - 247 - 240 245.6  BMI 31.81 32.32 - 32.59 - 31.66 32.4  Some encounter information is confidential and restricted. Go to Review Flowsheets activity to see all data.   Foot/eye exam completion dates Latest Ref Rng & Units 07/05/2021 10/08/2019  Eye Exam No Retinopathy No Retinopathy -  Foot Form Completion - - Done        COPD (chronic obstructive pulmonary disease) (HCC) worsenign due to ongoing nicotine, counseled to d/c same  Hyperlipidemia Hyperlipidemia:Low fat diet discussed and encouraged.   Lipid Panel  Lab Results  Component Value Date   CHOL 197 01/05/2022   HDL 39 (L) 01/05/2022   LDLCALC 86 01/05/2022   TRIG 360 (H) 01/05/2022   CHOLHDL 5.1 01/05/2022     Needs to reduce fat intake , will consider adding niacin due to high tG  Arm pain New onset upper extremity, likely due to c spine disease Toradol 60 mg iM in the office  Obesity (BMI 30.0-34.9)  Patient re-educated about  the importance of commitment to a  minimum of 150 minutes of exercise per week as able.  The importance of healthy food choices with portion control discussed, as well as eating regularly and within a 12 hour window most days. The need to choose "clean , green" food 50 to 75% of the time is discussed, as well as to make water the primary drink and set a goal of 64 ounces water daily.    Weight /BMI 01/27/2022 12/28/2021 09/15/2021  WEIGHT 241 lb 1.9 oz 245 lb 247 lb  HEIGHT 6\' 1"  6\' 1"  6\' 1"   BMI 31.81 kg/m2 32.32 kg/m2 32.59 kg/m2  Some encounter information is confidential and restricted. Go to Review Flowsheets activity to see all data.      Tobacco abuse Asked:confirms currently smokes cigarettes Assess: Unwilling to set a quit date, but is cutting back Advise: needs to QUIT to reduce risk of cancer, cardio and cerebrovascular disease Assist: counseled for 5 minutes and literature provided Arrange: follow  up in 2 to 4 months

## 2022-01-30 NOTE — Assessment & Plan Note (Signed)
Matthew Molina is reminded of the importance of commitment to daily physical activity for 30 minutes or more, as able and the need to limit carbohydrate intake to 30 to 60 grams per meal to help with blood sugar control.   The need to take medication as prescribed, test blood sugar as directed, and to call between visits if there is a concern that blood sugar is uncontrolled is also discussed.   Matthew Molina is reminded of the importance of daily foot exam, annual eye examination, and good blood sugar, blood pressure and cholesterol control.  Improved but still not at goal, updated lab in April, reports improved numbers and better dietary compliance Diabetic Labs Latest Ref Rng & Units 01/05/2022 09/15/2021 09/03/2021 06/23/2021 01/25/2021  HbA1c 4.8 - 5.6 % 8.0(H) - - 8.9(H) 7.3(H)  Microalbumin Not Estab. ug/mL - - - - -  Micro/Creat Ratio 0 - 29 mg/g creat - 54(H) - - -  Chol 0 - 200 mg/dL 197 - - - 94(L)  HDL >40 mg/dL 39(L) - - - 39(L)  Calc LDL 0 - 99 mg/dL 86 - - - 28  Triglycerides <150 mg/dL 360(H) - - - 163(H)  Creatinine 0.61 - 1.24 mg/dL 1.38(H) - 1.52(H) 1.30(H) 1.37(H)   BP/Weight 01/27/2022 12/28/2021 12/14/2021 09/15/2021 09/12/2021 09/03/2021 5/88/5027  Systolic BP 741 287 867 672 094 709 628  Diastolic BP 84 85 91 84 71 88 90  Wt. (Lbs) 241.12 245 - 247 - 240 245.6  BMI 31.81 32.32 - 32.59 - 31.66 32.4  Some encounter information is confidential and restricted. Go to Review Flowsheets activity to see all data.   Foot/eye exam completion dates Latest Ref Rng & Units 07/05/2021 10/08/2019  Eye Exam No Retinopathy No Retinopathy -  Foot Form Completion - - Done

## 2022-01-30 NOTE — Assessment & Plan Note (Signed)
worsenign due to ongoing nicotine, counseled to d/c same

## 2022-01-30 NOTE — Assessment & Plan Note (Signed)
Hyperlipidemia:Low fat diet discussed and encouraged.   Lipid Panel  Lab Results  Component Value Date   CHOL 197 01/05/2022   HDL 39 (L) 01/05/2022   LDLCALC 86 01/05/2022   TRIG 360 (H) 01/05/2022   CHOLHDL 5.1 01/05/2022     Needs to reduce fat intake , will consider adding niacin due to high tG

## 2022-01-30 NOTE — Assessment & Plan Note (Signed)
New onset upper extremity, likely due to c spine disease Toradol 60 mg iM in the office

## 2022-01-30 NOTE — Assessment & Plan Note (Signed)
Asked:confirms currently smokes cigarettes °Assess: Unwilling to set a quit date, but is cutting back °Advise: needs to QUIT to reduce risk of cancer, cardio and cerebrovascular disease °Assist: counseled for 5 minutes and literature provided °Arrange: follow up in 2 to 4 months ° °

## 2022-01-30 NOTE — Assessment & Plan Note (Signed)
°  Patient re-educated about  the importance of commitment to a  minimum of 150 minutes of exercise per week as able.  The importance of healthy food choices with portion control discussed, as well as eating regularly and within a 12 hour window most days. The need to choose "clean , green" food 50 to 75% of the time is discussed, as well as to make water the primary drink and set a goal of 64 ounces water daily.    Weight /BMI 01/27/2022 12/28/2021 09/15/2021  WEIGHT 241 lb 1.9 oz 245 lb 247 lb  HEIGHT 6\' 1"  6\' 1"  6\' 1"   BMI 31.81 kg/m2 32.32 kg/m2 32.59 kg/m2  Some encounter information is confidential and restricted. Go to Review Flowsheets activity to see all data.

## 2022-01-30 NOTE — Assessment & Plan Note (Signed)
Controlled, no change in medication DASH diet and commitment to daily physical activity for a minimum of 30 minutes discussed and encouraged, as a part of hypertension management. The importance of attaining a healthy weight is also discussed.  BP/Weight 01/27/2022 12/28/2021 12/14/2021 09/15/2021 09/12/2021 09/03/2021 7/95/3692  Systolic BP 230 097 949 971 820 990 689  Diastolic BP 84 85 91 84 71 88 90  Wt. (Lbs) 241.12 245 - 247 - 240 245.6  BMI 31.81 32.32 - 32.59 - 31.66 32.4  Some encounter information is confidential and restricted. Go to Review Flowsheets activity to see all data.

## 2022-02-01 DIAGNOSIS — J449 Chronic obstructive pulmonary disease, unspecified: Secondary | ICD-10-CM | POA: Diagnosis not present

## 2022-02-03 ENCOUNTER — Other Ambulatory Visit: Payer: Self-pay

## 2022-02-03 ENCOUNTER — Encounter: Payer: Self-pay | Admitting: Internal Medicine

## 2022-02-03 ENCOUNTER — Telehealth: Payer: Self-pay | Admitting: Family Medicine

## 2022-02-03 ENCOUNTER — Ambulatory Visit (INDEPENDENT_AMBULATORY_CARE_PROVIDER_SITE_OTHER): Payer: Medicare Other | Admitting: Internal Medicine

## 2022-02-03 VITALS — BP 144/84 | HR 96 | Ht 73.0 in | Wt 243.2 lb

## 2022-02-03 DIAGNOSIS — E785 Hyperlipidemia, unspecified: Secondary | ICD-10-CM | POA: Diagnosis not present

## 2022-02-03 MED ORDER — ROSUVASTATIN CALCIUM 10 MG PO TABS: 10.0000 mg | ORAL_TABLET | Freq: Every day | ORAL | 3 refills | Status: DC

## 2022-02-03 NOTE — Telephone Encounter (Deleted)
Patient called in regard to  rosuvastatin (CRESTOR) 10 MG tablet   Pt states had appt with heart dr , Dr. Harrington Challenger today 2/17 and they suggest increase in med to 20mg  and for patient to take everyday   Patient would like a call back

## 2022-02-03 NOTE — Telephone Encounter (Signed)
Pt called in regard to Crestor  Pt seen Dr. Harrington Challenger today 2/17   Increased Crestor to 10 mg daily   Can call patient back to discuss

## 2022-02-03 NOTE — Progress Notes (Signed)
Cardiology Office Note   Date:  02/03/2022   ID:  Matthew Molina, DOB 08-26-1951, MRN [id number]  PCP:  Fayrene Helper, MD  Cardiologist:   Dorris Carnes, MD   F/U of CAD     History of Present Illness: Matthew Molina is a 71 y.o. male with a history of HTN, DM, schizophrenia, tobacco use and CAD as noted by CT scan   I saw the pt in 2015 prior to CT   Had neg myoview.    He has been seen by Drs Johnsie Cancel, Dorena Dew    Last was in 2021   Myoview in 2019 showed no signficant ischemia     Breathing OK  he says Uses nebulizer No concerns about tightness in chest  Smokes about a ppd    When home   Nothing to do   When out does not smoke  Diet   Breakfast:  Cereal or left overs or boiled eggs   Coffee or diet pepsi Lunch:  Chicken salad  or turkey/   Water and diet pepsi Dinner:  Peanut butter and greens   Water / diet pepsi Snacks   Cutting back   Eats 3 or 4 chocolate chip cookies   Current Meds  Medication Sig   ACCU-CHEK GUIDE test strip USE AS DIRECTED TO CHECK BLOOD SUGAR ONCE DAILY.   Accu-Chek Softclix Lancets lancets TEST BLOOD SUGAR ONCE DAILY AS DIRECTED.   acetaminophen (TYLENOL) 500 MG tablet Take 500-1,000 mg by mouth as needed for headache. Takes mostly for HA about 2 x/week.   albuterol (VENTOLIN HFA) 108 (90 Base) MCG/ACT inhaler Inhale 2 puffs into the lungs every 6 (six) hours as needed for wheezing or shortness of breath.   benzonatate (TESSALON) 100 MG capsule Take 1 capsule (100 mg total) by mouth 2 (two) times daily as needed for cough.   brompheniramine-pseudoephedrine-DM 30-2-10 MG/5ML syrup Take 2.5 mLs by mouth 4 (four) times daily as needed.   budesonide-formoterol (SYMBICORT) 160-4.5 MCG/ACT inhaler Inhale 2 puffs into the lungs in the morning and at bedtime.   busPIRone (BUSPAR) 7.5 MG tablet Take 1 tablet (7.5 mg total) by mouth 3 (three) times daily.   docusate sodium (COLACE) 100 MG capsule Take 100 mg by mouth daily as needed for mild  constipation.    empagliflozin (JARDIANCE) 10 MG TABS tablet Take 1 tablet (10 mg total) by mouth daily before breakfast.   fenofibrate (TRICOR) 145 MG tablet Take 1 tablet (145 mg total) by mouth daily.   glipiZIDE (GLUCOTROL XL) 10 MG 24 hr tablet Take two tablets by mouth every morning at breakfast   glucose blood test strip Use as instructed   ipratropium-albuterol (DUONEB) 0.5-2.5 (3) MG/3ML SOLN USE 1 VIAL VIA NEBULIZER EVERY 4-6 HOURS.   Lancets 30G MISC Once daily testing dx e11.9   meloxicam (MOBIC) 7.5 MG tablet TAKE 1 TABLET BY MOUTH ONCE DAILY.   metFORMIN (GLUCOPHAGE) 500 MG tablet Take 1 tablet (500 mg total) by mouth daily with breakfast.   montelukast (SINGULAIR) 10 MG tablet Take 1 tablet (10 mg total) by mouth at bedtime.   mupirocin cream (BACTROBAN) 2 % Apply 1 application topically 2 (two) times daily.   olmesartan (BENICAR) 40 MG tablet Take 1 tablet (40 mg total) by mouth daily.   pantoprazole (PROTONIX) 40 MG tablet TAKE ONE TABLET BY MOUTH ONCE DAILY.   risperiDONE (RISPERDAL) 1 MG tablet TAKE ONE TABLET BY MOUTH IN THE EVENING. TAKE WITH 4MG  TABLET.  risperidone (RISPERDAL) 4 MG tablet TAKE ONE TABLET BY MOUTH IN THE EVENING. TAKE WITH 1MG  TABLET.   rosuvastatin (CRESTOR) 5 MG tablet Take one tablet by mouth every Monday, Wednesday and Friday   sucralfate (CARAFATE) 1 g tablet TAKE 1 TABLET BY MOUTH FOUR TIMES A DAY AS NEEDED.   tamsulosin (FLOMAX) 0.4 MG CAPS capsule Take 1 capsule (0.4 mg total) by mouth daily after supper.   traZODone (DESYREL) 50 MG tablet Take 1 tablet (50 mg total) by mouth at bedtime as needed. for sleep   triamterene-hydrochlorothiazide (MAXZIDE-25) 37.5-25 MG tablet TAKE 1/2 TABLET BY MOUTH ONCE DAILY.     Allergies:   Sertraline hcl and Wellbutrin [bupropion]   Past Medical History:  Diagnosis Date   Acute blood loss anemia 01/05/2020   Acute kidney injury ([place])    Acute respiratory failure with hypoxia (HCC)    Arthritis    COPD  (chronic obstructive pulmonary disease) ([place])    Depression    Diabetes mellitus    Diabetes mellitus without complication ([place])    Diverticulitis    Elevated PSA 10/01/2016   Encounter for support and coordination of transition of care 01/14/2020   Hypercholesterolemia    Hyperlipidemia    Hypertension    Insomnia 01/05/2016   Ischemic colitis ([place]) 01/05/2020   Multiple lung nodules on CT 04/02/2015   Nicotine addiction    Obesity    Oxygen deficiency    qhs   Schizophrenia [facility])     Past Surgical History:  Procedure Laterality Date   CATARACT EXTRACTION W/PHACO Left 11/20/2013   Procedure: CATARACT EXTRACTION PHACO AND INTRAOCULAR LENS PLACEMENT ([place]);  Surgeon: Tonny Branch, MD;  Location: [facility] ORS;  Service: Ophthalmology;  Laterality: Left;  CDE:10.26   CATARACT EXTRACTION W/PHACO Right 12/08/2013   Procedure: RIGHT EYE CATARACT EXTRACTION PHACO AND INTRAOCULAR LENS PLACEMENT ;  Surgeon: Tonny Branch, MD;  Location: [facility] ORS;  Service: Ophthalmology;  Laterality: Right;  CDE 12.38   COLONOSCOPY N/A 04/01/2013   pancolonic diverticulosis, redundant colon, large internal hemorrhoids.    COLONOSCOPY  2014   INCOMPLETE PREP IN R COLON   COLONOSCOPY N/A 04/01/2020   Procedure: COLONOSCOPY;  Surgeon: Danie Binder, MD;  Location: [facility] ENDO SUITE;  Service: Endoscopy;  Laterality: N/A;  8:45am   ESOPHAGOGASTRODUODENOSCOPY N/A 02/18/2016   stricture at GE junction, moderate erosive gastritis and mild non-erosive duodenitis. +H.pylori. Treated initially with Prevpac. Breath test to check for eradication was positive, and he was prescribed Pylera.    EYE SURGERY Left 11/2013   cataract extraction   POLYPECTOMY  04/01/2020   Procedure: POLYPECTOMY;  Surgeon: Danie Binder, MD;  Location: [facility] SU[facility];  Service: Endoscopy;;   SAVORY DILATION N/A 02/18/2016   Procedure: SAVORY DILATION;  Surgeon: Danie Binder, MD;  Location: [facility];  Service: Endoscopy;  Laterality: N/A;   SHOULDER SURGERY        Social History:  The patient  reports that he has been smoking cigarettes. He has a 96.00 pack-year smoking history. He has never used smokeless tobacco. He reports current alcohol use. He reports that he does not use drugs.   Family History:  The patient's family history includes Diabetes in his mother and sister; Drug abuse in his brother; Heart disease in his sister; Hypertension in his mother; Kidney disease in his father; Stroke in his mother.    ROS:  Please see the history of present illness. All other systems are reviewed and  Negative to the  above problem except as noted.    PHYSICAL EXAM: VS:  BP (!) 144/84    Pulse 96    Ht 6\' 1"  (1.854 m)    Wt 243 lb 3.2 oz (110.3 kg)    SpO2 97%    BMI 32.09 kg/m   GEN: Obese 71 yo in no acute distress  HEENT: normal  Neck: no JVD, carotid bruits, Cardiac: RRR; no murmurs  no Le edema  Respiratory:  clear to auscultation bilaterally, normal work of breathing GI: soft, nontender, nondistended, + BS  No hepatomegaly  MS: no deformity Moving all extremities   Skin: warm and dry, no rash Neuro:  Strength and sensation are intact Psych: euthymic mood, full affect   EKG:  EKG is not ordered today.     Cardiac Studies  Myoview 02/11/18 There was no ST segment deviation noted during stress. Defect 1: There is a small defect of mild severity present in the mid inferior and mid inferolateral location. This may represent a small ischemic territory vs variable soft tissue attenuation. Overall, this is a low risk study. Nuclear stress EF: 65%.   Lipid Panel    Component Value Date/Time   CHOL 197 01/05/2022 1200   CHOL 94 (L) 01/25/2021 0850   TRIG 360 (H) 01/05/2022 1200   HDL 39 (L) 01/05/2022 1200   HDL 39 (L) 01/25/2021 0850   CHOLHDL 5.1 01/05/2022 1200   VLDL 72 (H) 01/05/2022 1200   LDLCALC 86 01/05/2022 1200   LDLCALC 28 01/25/2021 0850   LDLCALC 104 (H) 05/26/2020 1149      Wt Readings from Last 3 Encounters:   02/03/22 243 lb 3.2 oz (110.3 kg)  01/27/22 241 lb 1.9 oz (109.4 kg)  12/28/21 245 lb (111.1 kg)      ASSESSMENT AND PLAN:  1  CAD   Pt denies CP   I am not convinced of any active ischemia.   CAD picked up on CT scans    Try to improve risk factors  2  HTN  BP is fair   Follow on current regimen for now  3  DM   Needs tighter control   Discussed diet   Limt carbs  4  HL  Increase crestor to 10 mg daily   F/U lipids in 8 wks   Keep on Tricor  5 COPD  Uses nebulizer     5  Tob use   Counselled on quitting     Current medicines are reviewed at length with the patient today.  The patient does not have concerns regarding medicines.  Signed, Dorris Carnes, MD  02/03/2022 8:45 AM    [facility] [place], [place], [place]  [address] Phone: [phone]; Fax: [phone]

## 2022-02-03 NOTE — Telephone Encounter (Signed)
Pt aware it is fine and to take the prescribed dose

## 2022-02-03 NOTE — Patient Instructions (Signed)
Medication Instructions:   Increase Crestor to 10 mg Daily   *If you need a refill on your cardiac medications before your next appointment, please call your pharmacy*   Lab Work: Your physician recommends that you return for lab work in: [place] ( 03/31/22)   If you have labs (blood work) drawn today and your tests are completely normal, you will receive your results only by: [place] (if you have MyChart) OR A paper copy in the mail If you have any lab test that is abnormal or we need to change your treatment, we will call you to review the results.   Testing/Procedures: NONE    Follow-Up: At [facility], you and your health needs are our priority.  As part of our continuing mission to provide you with exceptional heart care, we have created designated Provider Care Teams.  These Care Teams include your primary Cardiologist (physician) and Advanced Practice Providers (APPs -  Physician Assistants and Nurse Practitioners) who all work together to provide you with the care you need, when you need it.  We recommend signing up for the patient portal called "MyChart".  Sign up information is provided on this After Visit Summary.  MyChart is used to connect with patients for Virtual Visits (Telemedicine).  Patients are able to view lab/test results, encounter notes, upcoming appointments, etc.  Non-urgent messages can be sent to your provider as well.   To learn more about what you can do with MyChart, go to [email or web address].    Your next appointment:    October, November   The format for your next appointment:   In Person  Provider:   Dorris Carnes, MD    Other Instructions Thank you for choosing [facility]!

## 2022-02-07 ENCOUNTER — Telehealth: Payer: Self-pay | Admitting: Family Medicine

## 2022-02-07 NOTE — Telephone Encounter (Signed)
Please call pt he is weak, thinks he is taking to much medication

## 2022-02-08 ENCOUNTER — Other Ambulatory Visit: Payer: Self-pay | Admitting: Family Medicine

## 2022-02-09 ENCOUNTER — Telehealth: Payer: Self-pay | Admitting: Family Medicine

## 2022-02-09 NOTE — Telephone Encounter (Signed)
Has been feeling weak and bad and stopped taking the jardiance and metformin and his sugar went up and he tried to take one of the jardiance today and it made him feel bad again. Blood sugar was 259 and after the jardiance it was 171 non fasting.

## 2022-02-09 NOTE — Telephone Encounter (Signed)
Patient has stopped taking  [place]   Patient states that the medications were making him feel weak, and causing his blood sugar to drop too low.   Patient also states that he is feeling better now that he has stopped the said medications.  Patient would like a call back in regard.

## 2022-02-09 NOTE — Telephone Encounter (Signed)
Please advise 

## 2022-02-10 ENCOUNTER — Ambulatory Visit: Payer: Medicare Other | Admitting: Podiatry

## 2022-02-10 NOTE — Telephone Encounter (Signed)
Spoke with patient advised of Dr [employer] message and scheduled appt. Pt verbalized understanding

## 2022-02-14 NOTE — Telephone Encounter (Signed)
With available provider

## 2022-02-14 NOTE — Telephone Encounter (Signed)
Appt scheduled with Dr Posey Pronto virtual 03.01.2023 at 11:40

## 2022-02-15 ENCOUNTER — Ambulatory Visit (INDEPENDENT_AMBULATORY_CARE_PROVIDER_SITE_OTHER): Payer: Medicare Other | Admitting: Internal Medicine

## 2022-02-15 ENCOUNTER — Other Ambulatory Visit: Payer: Self-pay

## 2022-02-15 ENCOUNTER — Encounter: Payer: Self-pay | Admitting: Internal Medicine

## 2022-02-15 DIAGNOSIS — E1169 Type 2 diabetes mellitus with other specified complication: Secondary | ICD-10-CM | POA: Diagnosis not present

## 2022-02-15 DIAGNOSIS — E669 Obesity, unspecified: Secondary | ICD-10-CM

## 2022-02-15 MED ORDER — METFORMIN HCL ER 500 MG PO TB24: 500.0000 mg | ORAL_TABLET | Freq: Every day | ORAL | 2 refills | Status: DC

## 2022-02-15 NOTE — Progress Notes (Signed)
Virtual Visit via Telephone Note   This visit type was conducted due to national recommendations for restrictions regarding the COVID-19 Pandemic (e.g. social distancing) in an effort to limit this patient's exposure and mitigate transmission in our community.  Due to his co-morbid illnesses, this patient is at least at moderate risk for complications without adequate follow up.  This format is felt to be most appropriate for this patient at this time.  The patient did not have access to video technology/had technical difficulties with video requiring transitioning to audio format only (telephone).  All issues noted in this document were discussed and addressed.  No physical exam could be performed with this format.  Evaluation Performed:  Follow-up visit  Date:  02/15/2022   ID:  Matthew Molina, DOB 24-Nov-1951, MRN [id number]  Patient Location: Home Provider Location: Office/Clinic  Participants: Patient Location of Patient: Home Location of Provider: Telehealth Consent was obtain for visit to be over via telehealth. I verified that I am speaking with the correct person using two identifiers.  PCP:  Kerri Perches, MD   Chief Complaint: Fatigue  History of Present Illness:    Matthew Molina is a 71 y.o. male who has a televisit for complaint of fatigue since he started taking metformin and Jardiance, and he has stopped taking them now, feels better. He is currently taking glipizide only for his type II DM.  His fasting blood glucose has been ranging above 150 most of the time.  He tried taking Jardiance in between, but started feeling weak again and did stop taking it.  He denies any dysuria, hematuria or urinary frequency currently.  The patient does not have symptoms concerning for COVID-19 infection (fever, chills, cough, or new shortness of breath).   Past Medical, Surgical, Social History, Allergies, and Medications have been Reviewed.  Past Medical History:  Diagnosis  Date   Acute blood loss anemia 01/05/2020   Acute kidney injury (HCC)    Acute respiratory failure with hypoxia (HCC)    Arthritis    COPD (chronic obstructive pulmonary disease) (HCC)    Depression    Diabetes mellitus    Diabetes mellitus without complication (HCC)    Diverticulitis    Elevated PSA 10/01/2016   Encounter for support and coordination of transition of care 01/14/2020   Hypercholesterolemia    Hyperlipidemia    Hypertension    Insomnia 01/05/2016   Ischemic colitis (HCC) 01/05/2020   Multiple lung nodules on CT 04/02/2015   Nicotine addiction    Obesity    Oxygen deficiency    qhs   Schizophrenia [facility])    Past Surgical History:  Procedure Laterality Date   CATARACT EXTRACTION W/PHACO Left 11/20/2013   Procedure: CATARACT EXTRACTION PHACO AND INTRAOCULAR LENS PLACEMENT (IOC);  Surgeon: Gemma Payor, MD;  Location: [facility];  Service: Ophthalmology;  Laterality: Left;  CDE:10.26   CATARACT EXTRACTION W/PHACO Right 12/08/2013   Procedure: RIGHT EYE CATARACT EXTRACTION PHACO AND INTRAOCULAR LENS PLACEMENT ;  Surgeon: Gemma Payor, MD;  Location: [facility];  Service: Ophthalmology;  Laterality: Right;  CDE 12.38   COLONOSCOPY N/A 04/01/2013   pancolonic diverticulosis, redundant colon, large internal hemorrhoids.    COLONOSCOPY  2014   INCOMPLETE PREP IN R COLON   COLONOSCOPY N/A 04/01/2020   Procedure: COLONOSCOPY;  Surgeon: West Bali, MD;  Location: [facility];  Service: Endoscopy;  Laterality: N/A;  8:45am   ESOPHAGOGASTRODUODENOSCOPY N/A 02/18/2016   stricture at GE junction, moderate  erosive gastritis and mild non-erosive duodenitis. +H.pylori. Treated initially with Prevpac. Breath test to check for eradication was positive, and he was prescribed Pylera.    EYE SURGERY Left 11/2013   cataract extraction   POLYPECTOMY  04/01/2020   Procedure: POLYPECTOMY;  Surgeon: West Bali, MD;  Location: [facility];  Service: Endoscopy;;   SAVORY DILATION N/A 02/18/2016    Procedure: SAVORY DILATION;  Surgeon: West Bali, MD;  Location: [facility];  Service: Endoscopy;  Laterality: N/A;   SHOULDER SURGERY       Current Meds  Medication Sig   ACCU-CHEK GUIDE test strip USE AS DIRECTED TO CHECK BLOOD SUGAR ONCE DAILY.   Accu-Chek Softclix Lancets lancets TEST BLOOD SUGAR ONCE DAILY AS DIRECTED.   acetaminophen (TYLENOL) 500 MG tablet Take 500-1,000 mg by mouth as needed for headache. Takes mostly for HA about 2 x/week.   albuterol (VENTOLIN HFA) 108 (90 Base) MCG/ACT inhaler Inhale 2 puffs into the lungs every 6 (six) hours as needed for wheezing or shortness of breath.   benzonatate (TESSALON) 100 MG capsule Take 1 capsule (100 mg total) by mouth 2 (two) times daily as needed for cough.   brompheniramine-pseudoephedrine-DM 30-2-10 MG/5ML syrup Take 2.5 mLs by mouth 4 (four) times daily as needed.   budesonide-formoterol (SYMBICORT) 160-4.5 MCG/ACT inhaler Inhale 2 puffs into the lungs in the morning and at bedtime.   busPIRone (BUSPAR) 7.5 MG tablet Take 1 tablet (7.5 mg total) by mouth 3 (three) times daily.   docusate sodium (COLACE) 100 MG capsule Take 100 mg by mouth daily as needed for mild constipation.    fenofibrate (TRICOR) 145 MG tablet Take 1 tablet (145 mg total) by mouth daily.   glipiZIDE (GLUCOTROL XL) 10 MG 24 hr tablet Take two tablets by mouth every morning at breakfast   glucose blood test strip Use as instructed   ipratropium-albuterol (DUONEB) 0.5-2.5 (3) MG/3ML SOLN USE 1 VIAL VIA NEBULIZER EVERY 4-6 HOURS.   Lancets 30G MISC Once daily testing dx e11.9   meloxicam (MOBIC) 7.5 MG tablet TAKE 1 TABLET BY MOUTH ONCE DAILY.   montelukast (SINGULAIR) 10 MG tablet Take 1 tablet (10 mg total) by mouth at bedtime.   mupirocin cream (BACTROBAN) 2 % Apply 1 application topically 2 (two) times daily.   olmesartan (BENICAR) 40 MG tablet Take 1 tablet (40 mg total) by mouth daily.   pantoprazole (PROTONIX) 40 MG tablet TAKE ONE TABLET BY MOUTH  ONCE DAILY.   risperiDONE (RISPERDAL) 1 MG tablet TAKE ONE TABLET BY MOUTH IN THE EVENING. TAKE WITH 4MG  TABLET.   risperidone (RISPERDAL) 4 MG tablet TAKE ONE TABLET BY MOUTH IN THE EVENING. TAKE WITH 1MG  TABLET.   rosuvastatin (CRESTOR) 10 MG tablet Take 1 tablet (10 mg total) by mouth daily.   sucralfate (CARAFATE) 1 g tablet TAKE 1 TABLET BY MOUTH FOUR TIMES A DAY AS NEEDED.   tamsulosin (FLOMAX) 0.4 MG CAPS capsule Take 1 capsule (0.4 mg total) by mouth daily after supper.   traZODone (DESYREL) 50 MG tablet TAKE ONE TABLET BY MOUTH AT BEDTIME AS NEEDED FOR SLEEP.   triamterene-hydrochlorothiazide (MAXZIDE-25) 37.5-25 MG tablet TAKE 1/2 TABLET BY MOUTH ONCE DAILY.   metFORMIN (GLUCOPHAGE-XR) 500 MG 24 hr tablet Take 1 tablet (500 mg total) by mouth daily with breakfast.     Allergies:   Sertraline hcl and Wellbutrin [bupropion]   ROS:   Please see the history of present illness.     All other systems  reviewed and are negative.   Labs/Other Tests and Data Reviewed:    Recent Labs: 06/23/2021: TSH 1.050 09/03/2021: B Natriuretic Peptide 35.0 01/05/2022: ALT 61; BUN 12; Creatinine, Ser 1.38; Hemoglobin 14.1; Platelets 204; Potassium 3.4; Sodium 135   Recent Lipid Panel Lab Results  Component Value Date/Time   CHOL 197 01/05/2022 12:00 PM   CHOL 94 (L) 01/25/2021 08:50 AM   TRIG 360 (H) 01/05/2022 12:00 PM   HDL 39 (L) 01/05/2022 12:00 PM   HDL 39 (L) 01/25/2021 08:50 AM   CHOLHDL 5.1 01/05/2022 12:00 PM   LDLCALC 86 01/05/2022 12:00 PM   LDLCALC 28 01/25/2021 08:50 AM   LDLCALC 104 (H) 05/26/2020 11:49 AM    Wt Readings from Last 3 Encounters:  02/03/22 243 lb 3.2 oz (110.3 kg)  01/27/22 241 lb 1.9 oz (109.4 kg)  12/28/21 245 lb (111.1 kg)     ASSESSMENT & PLAN:    Type II DM  Lab Results  Component Value Date   HGBA1C 8.0 (H) 01/05/2022    On Glipizide Has stopped taking metformin and Jardiance Changed to Metformin XR for better tolerability His  fatigue/weakness is likely due to normalization of glycemic profile, as he might have had hyperglycemia for long time Advised to follow diabetic diet On statin and ARB Diabetic eye exam: Advised to follow up with Ophthalmology for diabetic eye exam   Time:   Today, I have spent 15 minutes reviewing the chart, including problem list, medications, and with the patient with telehealth technology discussing the above problems.   Medication Adjustments/Labs and Tests Ordered: Current medicines are reviewed at length with the patient today.  Concerns regarding medicines are outlined above.   Tests Ordered: No orders of the defined types were placed in this encounter.   Medication Changes: Meds ordered this encounter  Medications   metFORMIN (GLUCOPHAGE-XR) 500 MG 24 hr tablet    Sig: Take 1 tablet (500 mg total) by mouth daily with breakfast.    Dispense:  30 tablet    Refill:  2    Change in formulation - 02/15/22     Note: This dictation was prepared with Dragon dictation along with smaller phrase technology. Similar sounding words can be transcribed inadequately or may not be corrected upon review. Any transcriptional errors that result from this process are unintentional.      Disposition:  Follow up  Signed, Anabel Halon, MD  02/15/2022 8:39 AM     [facility]

## 2022-02-17 DIAGNOSIS — I10 Essential (primary) hypertension: Secondary | ICD-10-CM | POA: Diagnosis not present

## 2022-02-17 LAB — HM DIABETES EYE EXAM

## 2022-02-18 LAB — CMP14+EGFR
ALT: 36 IU/L (ref 0–44)
AST: 27 IU/L (ref 0–40)
Albumin/Globulin Ratio: 1.8 (ref 1.2–2.2)
Albumin: 4.7 g/dL (ref 3.7–4.7)
Alkaline Phosphatase: 47 IU/L (ref 44–121)
BUN/Creatinine Ratio: 11 (ref 10–24)
BUN: 19 mg/dL (ref 8–27)
Bilirubin Total: 0.3 mg/dL (ref 0.0–1.2)
CO2: 21 mmol/L (ref 20–29)
Calcium: 10.4 mg/dL — ABNORMAL HIGH (ref 8.6–10.2)
Chloride: 100 mmol/L (ref 96–106)
Creatinine, Ser: 1.66 mg/dL — ABNORMAL HIGH (ref 0.76–1.27)
Globulin, Total: 2.6 g/dL (ref 1.5–4.5)
Glucose: 211 mg/dL — ABNORMAL HIGH (ref 70–99)
Potassium: 3.9 mmol/L (ref 3.5–5.2)
Sodium: 136 mmol/L (ref 134–144)
Total Protein: 7.3 g/dL (ref 6.0–8.5)
eGFR: 44 mL/min/{1.73_m2} — ABNORMAL LOW (ref >59)

## 2022-02-27 ENCOUNTER — Other Ambulatory Visit: Payer: Self-pay | Admitting: Family Medicine

## 2022-02-27 DIAGNOSIS — M25551 Pain in right hip: Secondary | ICD-10-CM

## 2022-03-01 DIAGNOSIS — J449 Chronic obstructive pulmonary disease, unspecified: Secondary | ICD-10-CM | POA: Diagnosis not present

## 2022-03-02 ENCOUNTER — Ambulatory Visit (HOSPITAL_COMMUNITY)
Admission: RE | Admit: 2022-03-02 | Discharge: 2022-03-02 | Disposition: A | Discharge status: Home / Self Care | Payer: Medicare Other | Source: Ambulatory Visit | Attending: Physician Assistant | Admitting: Physician Assistant

## 2022-03-02 ENCOUNTER — Other Ambulatory Visit: Payer: Self-pay

## 2022-03-02 DIAGNOSIS — Z122 Encounter for screening for malignant neoplasm of respiratory organs: Secondary | ICD-10-CM | POA: Insufficient documentation

## 2022-03-02 DIAGNOSIS — Z87891 Personal history of nicotine dependence: Secondary | ICD-10-CM | POA: Insufficient documentation

## 2022-03-06 ENCOUNTER — Other Ambulatory Visit: Payer: Self-pay | Admitting: Family Medicine

## 2022-03-14 LAB — HM DIABETES EYE EXAM

## 2022-03-20 ENCOUNTER — Other Ambulatory Visit: Payer: Self-pay | Admitting: Family Medicine

## 2022-03-21 ENCOUNTER — Ambulatory Visit: Payer: Medicare Other | Admitting: Family Medicine

## 2022-03-22 ENCOUNTER — Encounter: Payer: Self-pay | Admitting: Family Medicine

## 2022-03-22 ENCOUNTER — Ambulatory Visit (INDEPENDENT_AMBULATORY_CARE_PROVIDER_SITE_OTHER): Payer: Medicare Other | Admitting: Family Medicine

## 2022-03-22 VITALS — BP 150/84 | HR 106 | Ht 73.0 in | Wt 238.1 lb

## 2022-03-22 DIAGNOSIS — N401 Enlarged prostate with lower urinary tract symptoms: Secondary | ICD-10-CM

## 2022-03-22 DIAGNOSIS — N138 Other obstructive and reflux uropathy: Secondary | ICD-10-CM

## 2022-03-22 DIAGNOSIS — F17210 Nicotine dependence, cigarettes, uncomplicated: Secondary | ICD-10-CM

## 2022-03-22 DIAGNOSIS — Z720 Tobacco use: Secondary | ICD-10-CM

## 2022-03-22 DIAGNOSIS — E785 Hyperlipidemia, unspecified: Secondary | ICD-10-CM

## 2022-03-22 DIAGNOSIS — J42 Unspecified chronic bronchitis: Secondary | ICD-10-CM

## 2022-03-22 DIAGNOSIS — E669 Obesity, unspecified: Secondary | ICD-10-CM | POA: Diagnosis not present

## 2022-03-22 DIAGNOSIS — I10 Essential (primary) hypertension: Secondary | ICD-10-CM

## 2022-03-22 DIAGNOSIS — E1169 Type 2 diabetes mellitus with other specified complication: Secondary | ICD-10-CM | POA: Diagnosis not present

## 2022-03-22 DIAGNOSIS — F209 Schizophrenia, unspecified: Secondary | ICD-10-CM

## 2022-03-22 NOTE — Patient Instructions (Signed)
Keep May appt, call if you need me sooner ? ?Nurse please reorder labs for 5 days before May appt, fasting lipid, cmp and EGFr and hBA1C ? ?Please work on quitting smoking, will help to protect kidneys, heart , brain and all body organs ? ?Need covid booster and 2nd shingrix vaccine bothare at your pharmacy ? ?It is important that you exercise regularly at least 30 minutes 5 times a week. If you develop chest pain, have severe difficulty breathing, or feel very tired, stop exercising immediately and seek medical attention  ?Stationary bike a better option , not the treadmill! ? ?Thanks for choosing [facility], we consider it a privelige to serve you. ? ? ? ?

## 2022-03-23 ENCOUNTER — Other Ambulatory Visit: Payer: Self-pay | Admitting: Family Medicine

## 2022-04-01 DIAGNOSIS — J449 Chronic obstructive pulmonary disease, unspecified: Secondary | ICD-10-CM | POA: Diagnosis not present

## 2022-04-03 ENCOUNTER — Encounter: Payer: Self-pay | Admitting: Family Medicine

## 2022-04-03 NOTE — Progress Notes (Signed)
? ?Matthew GAUTHREAUX     MRN: [id number]      DOB: 23-Oct-1951 ? ? ?HPI ?Matthew Molina is here for follow up and re-evaluation of chronic medical conditions, medication management and review of any available recent lab and radiology data.  ?Preventive health is updated, specifically  Cancer screening and Immunization.   ?egarding consultations or procedures which the PT has had in the interim are  addressed. ?The PT denies any adverse reactions to current medications since the last visit.  ?There are no new concerns.  ?There are no specific complaints  ? ?ROS ?Denies recent fever or chills. ?Denies sinus pressure, nasal congestion, ear pain or sore throat. ?Denies chest congestion, productive cough or wheezing. ?Denies chest pains, palpitations and leg swelling ?Denies abdominal pain, nausea, vomiting,diarrhea or constipation.   ?Denies dysuria, frequency, hesitancy or incontinence. ?C/o chronic joint pain, swelling and limitation in mobility. ?Denies headaches, seizures, numbness, or tingling. ?Denies depression, anxiety or insomnia. ?Denies skin break down or rash. ? ? ?PE ? ?BP (!) 150/84   Pulse (!) 106   Ht '6\' 1"'$  (1.854 m)   Wt 238 lb 1.9 oz (108 kg)   SpO2 95%   BMI 31.42 kg/m?  ? ?Patient alert and oriented and in no cardiopulmonary distress. ? ?HEENT: No facial asymmetry, EOMI,     Neck supple . ? ?Chest: Clear to auscultation bilaterally. ? ?CVS: S1, S2 no murmurs, no S3.Regular rate. ? ?ABD: Soft non tender.  ? ?Ext: No edema ? ?MS: decreased  ROM spine, shoulders, hips and knees. ? ?Skin: Intact, no ulcerations or rash noted. ? ?Psych: Good eye contact, flat affect. t not anxious or depressed appearing. ? ?CNS: CN 2-12 intact, power,  normal throughout.no focal deficits noted. ? ? ?Assessment & Plan ? ?Hypertension ?DASH diet and commitment to daily physical activity for a minimum of 30 minutes discussed and encouraged, as a part of hypertension management. ?The importance of attaining a healthy weight is  also discussed. ? ? ?  03/22/2022  ? 10:50 AM 02/03/2022  ?  8:39 AM 01/27/2022  ?  3:42 PM 01/27/2022  ?  3:06 PM 12/28/2021  ?  9:02 AM 12/14/2021  ? 10:21 AM 09/15/2021  ?  9:35 AM  ?BP/Weight  ?Systolic BP 132 440 102 725 146 151 141  ?Diastolic BP 84 84 84 86 85 91 84  ?Wt. (Lbs) 238.12 243.2  241.12 245  247  ?BMI 31.42 kg/m2 32.09 kg/m2  31.81 kg/m2 32.32 kg/m2  32.59 kg/m2  ?still above goal , needs to consisitently tawke meds as prescrinded, will f/u I next several weeks ?.needs to quit smoking ? ? ? ? ?Obesity (BMI 30.0-34.9) ? ?Patient re-educated about  the importance of commitment to a  minimum of 150 minutes of exercise per week as able. ? ?The importance of healthy food choices with portion control discussed, as well as eating regularly and within a 12 hour window most days. ?The need to choose "clean , green" food 50 to 75% of the time is discussed, as well as to make water the primary drink and set a goal of 64 ounces water daily. ? ?  ? ?  03/22/2022  ? 10:50 AM 02/03/2022  ?  8:39 AM 01/27/2022  ?  3:06 PM  ?Weight /BMI  ?Weight 238 lb 1.9 oz 243 lb 3.2 oz 241 lb 1.9 oz  ?Height '6\' 1"'$  (1.854 m) '6\' 1"'$  (1.854 m) '6\' 1"'$  (1.854 m)  ?BMI 31.42 kg/m2 32.09 kg/m2  31.81 kg/m2  ? ? ? ? ?COPD (chronic obstructive pulmonary disease) ([place]) ?Worsening due to ingoing nicotine use, smokes 1 PPD and does not feel able to significantly cut down or quit at this time, continue maintainace meds ? ?Benign prostatic hyperplasia with urinary obstruction ?Good response to urine stream with medication ? ?Hyperlipidemia ?Hyperlipidemia:Low fat diet discussed and encouraged. ? ? ?Lipid Panel  ?Lab Results  ?Component Value Date  ? CHOL 197 01/05/2022  ? HDL 39 (L) 01/05/2022  ? [place] 86 01/05/2022  ? TRIG 360 (H) 01/05/2022  ? CHOLHDL 5.1 01/05/2022  ? ? ? ?Updated lab needed at/ before next visit. ?Needs to reduce fat in diet,  ? ?Tobacco abuse ?Asked:confirms currently smokes cigarettes 1 PPD ?Assess: Unwilling to set a quit date,  but is trying to cut back, though feels incapable at this time ?Advise: needs to QUIT to reduce risk of cancer, cardio and cerebrovascular disease ?Assist: counseled for 5 minutes and literature provided ?Arrange: follow up in 2 to 4 months ? ? ?Schizophrenia, unspecified type ([place]) ?And controlled on current meds ? ?Diabetes mellitus type 2 in obese [facility]) ?[name] is reminded of the importance of commitment to daily physical activity for 30 minutes or more, as able and the need to limit carbohydrate intake to 30 to 60 grams per meal to help with blood sugar control.  ? ?The need to take medication as prescribed, test blood sugar as directed, and to call between visits if there is a concern that blood sugar is uncontrolled is also discussed.  ? ?[name] is reminded of the importance of daily foot exam, annual eye examination, and good blood sugar, blood pressure and cholesterol control. ? ?Updated lab needed at/ before next visit. ? ? ?  Latest Ref Rng & Units 02/17/2022  ?  9:09 AM 01/05/2022  ? 12:00 PM 01/05/2022  ? 11:58 AM 09/15/2021  ?  1:15 PM 09/03/2021  ?  9:24 AM  ?Diabetic Labs  ?HbA1c 4.8 - 5.6 %  8.0       ?Micro/Creat Ratio 0 - 29 mg/g creat    54     ?Chol 0 - 200 mg/dL  197       ?HDL >40 mg/dL  39       ?Calc LDL 0 - 99 mg/dL  86       ?Triglycerides <150 mg/dL  360       ?Creatinine 0.76 - 1.27 mg/dL 1.66    1.38    1.52    ? ? ?  03/22/2022  ? 10:50 AM 02/03/2022  ?  8:39 AM 01/27/2022  ?  3:42 PM 01/27/2022  ?  3:06 PM 12/28/2021  ?  9:02 AM 12/14/2021  ? 10:21 AM 09/15/2021  ?  9:35 AM  ?BP/Weight  ?Systolic BP 510 258 527 782 146 151 141  ?Diastolic BP 84 84 84 86 85 91 84  ?Wt. (Lbs) 238.12 243.2  241.12 245  247  ?BMI 31.42 kg/m2 32.09 kg/m2  31.81 kg/m2 32.32 kg/m2  32.59 kg/m2  ? ? ?  Latest Ref Rng & Units 03/14/2022  ?  2:55 PM 02/17/2022  ? 12:00 AM  ?Foot/eye exam completion dates  ?Eye Exam No Retinopathy No Retinopathy      No Retinopathy       ?  ? This result is from an external source.   ? ? ? ? ? ? ?

## 2022-04-03 NOTE — Assessment & Plan Note (Signed)
Worsening due to ingoing nicotine use, smokes 1 PPD and does not feel able to significantly cut down or quit at this time, continue maintainace meds ?

## 2022-04-03 NOTE — Assessment & Plan Note (Signed)
DASH diet and commitment to daily physical activity for a minimum of 30 minutes discussed and encouraged, as a part of hypertension management. ?The importance of attaining a healthy weight is also discussed. ? ? ?  03/22/2022  ? 10:50 AM 02/03/2022  ?  8:39 AM 01/27/2022  ?  3:42 PM 01/27/2022  ?  3:06 PM 12/28/2021  ?  9:02 AM 12/14/2021  ? 10:21 AM 09/15/2021  ?  9:35 AM  ?BP/Weight  ?Systolic BP 549 826 415 830 146 151 141  ?Diastolic BP 84 84 84 86 85 91 84  ?Wt. (Lbs) 238.12 243.2  241.12 245  247  ?BMI 31.42 kg/m2 32.09 kg/m2  31.81 kg/m2 32.32 kg/m2  32.59 kg/m2  ?still above goal , needs to consisitently tawke meds as prescrinded, will f/u I next several weeks ?.needs to quit smoking ? ? ? ?

## 2022-04-03 NOTE — Assessment & Plan Note (Signed)
Matthew Molina is reminded of the importance of commitment to daily physical activity for 30 minutes or more, as able and the need to limit carbohydrate intake to 30 to 60 grams per meal to help with blood sugar control.  ? ?The need to take medication as prescribed, test blood sugar as directed, and to call between visits if there is a concern that blood sugar is uncontrolled is also discussed.  ? ?Matthew Molina is reminded of the importance of daily foot exam, annual eye examination, and good blood sugar, blood pressure and cholesterol control. ? ?Updated lab needed at/ before next visit. ? ? ?  Latest Ref Rng & Units 02/17/2022  ?  9:09 AM 01/05/2022  ? 12:00 PM 01/05/2022  ? 11:58 AM 09/15/2021  ?  1:15 PM 09/03/2021  ?  9:24 AM  ?Diabetic Labs  ?HbA1c 4.8 - 5.6 %  8.0       ?Micro/Creat Ratio 0 - 29 mg/g creat    54     ?Chol 0 - 200 mg/dL  197       ?HDL >40 mg/dL  39       ?Calc LDL 0 - 99 mg/dL  86       ?Triglycerides <150 mg/dL  360       ?Creatinine 0.76 - 1.27 mg/dL 1.66    1.38    1.52    ? ? ?  03/22/2022  ? 10:50 AM 02/03/2022  ?  8:39 AM 01/27/2022  ?  3:42 PM 01/27/2022  ?  3:06 PM 12/28/2021  ?  9:02 AM 12/14/2021  ? 10:21 AM 09/15/2021  ?  9:35 AM  ?BP/Weight  ?Systolic BP 196 222 979 892 146 151 141  ?Diastolic BP 84 84 84 86 85 91 84  ?Wt. (Lbs) 238.12 243.2  241.12 245  247  ?BMI 31.42 kg/m2 32.09 kg/m2  31.81 kg/m2 32.32 kg/m2  32.59 kg/m2  ? ? ?  Latest Ref Rng & Units 03/14/2022  ?  2:55 PM 02/17/2022  ? 12:00 AM  ?Foot/eye exam completion dates  ?Eye Exam No Retinopathy No Retinopathy      No Retinopathy       ?  ? This result is from an external source.  ? ? ? ? ? ?

## 2022-04-03 NOTE — Assessment & Plan Note (Signed)
And controlled on current meds ?

## 2022-04-03 NOTE — Assessment & Plan Note (Addendum)
Hyperlipidemia:Low fat diet discussed and encouraged. ? ? ?Lipid Panel  ?Lab Results  ?Component Value Date  ? CHOL 197 01/05/2022  ? HDL 39 (L) 01/05/2022  ? [place] 86 01/05/2022  ? TRIG 360 (H) 01/05/2022  ? CHOLHDL 5.1 01/05/2022  ? ? ? ?Updated lab needed at/ before next visit. ?Needs to reduce fat in diet,  ?

## 2022-04-03 NOTE — Assessment & Plan Note (Signed)
?  Patient re-educated about  the importance of commitment to a  minimum of 150 minutes of exercise per week as able. ? ?The importance of healthy food choices with portion control discussed, as well as eating regularly and within a 12 hour window most days. ?The need to choose "clean , green" food 50 to 75% of the time is discussed, as well as to make water the primary drink and set a goal of 64 ounces water daily. ? ?  ? ?  03/22/2022  ? 10:50 AM 02/03/2022  ?  8:39 AM 01/27/2022  ?  3:06 PM  ?Weight /BMI  ?Weight 238 lb 1.9 oz 243 lb 3.2 oz 241 lb 1.9 oz  ?Height '6\' 1"'$  (1.854 m) '6\' 1"'$  (1.854 m) '6\' 1"'$  (1.854 m)  ?BMI 31.42 kg/m2 32.09 kg/m2 31.81 kg/m2  ? ? ? ?

## 2022-04-03 NOTE — Assessment & Plan Note (Signed)
Asked:confirms currently smokes cigarettes 1 PPD ?Assess: Unwilling to set a quit date, but is trying to cut back, though feels incapable at this time ?Advise: needs to QUIT to reduce risk of cancer, cardio and cerebrovascular disease ?Assist: counseled for 5 minutes and literature provided ?Arrange: follow up in 2 to 4 months ? ?

## 2022-04-03 NOTE — Assessment & Plan Note (Signed)
Good response to urine stream with medication ?

## 2022-04-04 ENCOUNTER — Other Ambulatory Visit: Payer: Self-pay | Admitting: Family Medicine

## 2022-04-05 ENCOUNTER — Encounter: Payer: Self-pay | Admitting: Podiatry

## 2022-04-05 ENCOUNTER — Ambulatory Visit (INDEPENDENT_AMBULATORY_CARE_PROVIDER_SITE_OTHER): Payer: Medicare Other | Admitting: Podiatry

## 2022-04-05 DIAGNOSIS — Q828 Other specified congenital malformations of skin: Secondary | ICD-10-CM | POA: Diagnosis not present

## 2022-04-05 DIAGNOSIS — M79675 Pain in left toe(s): Secondary | ICD-10-CM

## 2022-04-05 DIAGNOSIS — E1140 Type 2 diabetes mellitus with diabetic neuropathy, unspecified: Secondary | ICD-10-CM

## 2022-04-05 DIAGNOSIS — M2141 Flat foot [pes planus] (acquired), right foot: Secondary | ICD-10-CM

## 2022-04-05 DIAGNOSIS — M79674 Pain in right toe(s): Secondary | ICD-10-CM | POA: Diagnosis not present

## 2022-04-05 DIAGNOSIS — B351 Tinea unguium: Secondary | ICD-10-CM | POA: Diagnosis not present

## 2022-04-05 NOTE — Progress Notes (Signed)
This patient returns to my office for at risk foot care.  This patient requires this care by a professional since this patient will be at risk due to having diabetes mellitus and chronic kidney disease.  Patient has pain in the two calluses on the bottom outside of his feet.  This patient is unable to cut nails himself since the patient cannot reach his nails.These nails are painful walking and wearing shoes. He cannot treat the painful calluses himself. . This patient presents for at risk foot care today.  General Appearance  Alert, conversant and in no acute stress.  Vascular  Dorsalis pedis and posterior tibial  pulses are palpable  bilaterally.  Capillary return is within normal limits  bilaterally. Temperature is within normal limits  bilaterally.  Neurologic  Senn-Weinstein monofilament wire test diminished   bilaterally. Muscle power within normal limits bilaterally.  Nails Thick disfigured discolored nails with subungual debris  from hallux to fifth toes bilaterally. No evidence of bacterial infection or drainage bilaterally.  Orthopedic  No limitations of motion  feet .  No crepitus or effusions noted.  No bony pathology or digital deformities noted.  Skin  normotropic skin  noted bilaterally.  No signs of infections or ulcers noted.   Porokeratosis sub 5th metabase  B/L.  Onychomycosis  Pain in right toes  Pain in left toes  Porokeratosis  X 2.  Consent was obtained for treatment procedures.   Mechanical debridement of nails 1-5  bilaterally performed with a nail nipper.  Filed with dremel without incident. Debridement of porokeratosis  with # 15 blade.     Return office visit   3 months                  Told patient to return for periodic foot care and evaluation due to potential at risk complications.   Bular Hickok DPM  

## 2022-04-10 ENCOUNTER — Other Ambulatory Visit: Payer: Self-pay | Admitting: Family Medicine

## 2022-04-14 ENCOUNTER — Other Ambulatory Visit: Payer: Self-pay | Admitting: Family Medicine

## 2022-04-14 DIAGNOSIS — J449 Chronic obstructive pulmonary disease, unspecified: Secondary | ICD-10-CM

## 2022-04-24 ENCOUNTER — Other Ambulatory Visit: Payer: Self-pay | Admitting: Family Medicine

## 2022-04-26 DIAGNOSIS — E1169 Type 2 diabetes mellitus with other specified complication: Secondary | ICD-10-CM | POA: Diagnosis not present

## 2022-04-26 DIAGNOSIS — I10 Essential (primary) hypertension: Secondary | ICD-10-CM | POA: Diagnosis not present

## 2022-04-26 DIAGNOSIS — E785 Hyperlipidemia, unspecified: Secondary | ICD-10-CM | POA: Diagnosis not present

## 2022-04-27 LAB — CMP14+EGFR
ALT: 34 IU/L (ref 0–44)
AST: 30 IU/L (ref 0–40)
Albumin/Globulin Ratio: 1.6 (ref 1.2–2.2)
Albumin: 4.6 g/dL (ref 3.7–4.7)
Alkaline Phosphatase: 41 IU/L — ABNORMAL LOW (ref 44–121)
BUN/Creatinine Ratio: 8 — ABNORMAL LOW (ref 10–24)
BUN: 14 mg/dL (ref 8–27)
Bilirubin Total: 0.5 mg/dL (ref 0.0–1.2)
CO2: 23 mmol/L (ref 20–29)
Calcium: 10.9 mg/dL — ABNORMAL HIGH (ref 8.6–10.2)
Chloride: 102 mmol/L (ref 96–106)
Creatinine, Ser: 1.74 mg/dL — ABNORMAL HIGH (ref 0.76–1.27)
Globulin, Total: 2.9 g/dL (ref 1.5–4.5)
Glucose: 109 mg/dL — ABNORMAL HIGH (ref 70–99)
Potassium: 4 mmol/L (ref 3.5–5.2)
Sodium: 141 mmol/L (ref 134–144)
Total Protein: 7.5 g/dL (ref 6.0–8.5)
eGFR: 41 mL/min/{1.73_m2} — ABNORMAL LOW (ref >59)

## 2022-04-27 LAB — LIPID PANEL
Chol/HDL Ratio: 4 ratio (ref 0.0–5.0)
Cholesterol, Total: 145 mg/dL (ref 100–199)
HDL: 36 mg/dL — ABNORMAL LOW (ref >39)
LDL Chol Calc (NIH): 76 mg/dL (ref 0–99)
Triglycerides: 199 mg/dL — ABNORMAL HIGH (ref 0–149)
VLDL Cholesterol Cal: 33 mg/dL (ref 5–40)

## 2022-04-27 LAB — HEMOGLOBIN A1C
Est. average glucose Bld gHb Est-mCnc: 146 mg/dL
Hgb A1c MFr Bld: 6.7 % — ABNORMAL HIGH (ref 4.8–5.6)

## 2022-04-28 ENCOUNTER — Other Ambulatory Visit: Payer: Self-pay

## 2022-04-28 ENCOUNTER — Telehealth: Payer: Self-pay

## 2022-04-28 MED ORDER — BUSPIRONE HCL 7.5 MG PO TABS: 7.5000 mg | ORAL_TABLET | Freq: Three times a day (TID) | ORAL | 3 refills | Status: DC

## 2022-04-28 NOTE — Telephone Encounter (Signed)
Patient called need med refill ? ?busPIRone (BUSPAR) 7.5 MG tablet  ? ?Pharmacy:  [facility] ?

## 2022-04-28 NOTE — Telephone Encounter (Signed)
Refill sent.

## 2022-05-01 DIAGNOSIS — J449 Chronic obstructive pulmonary disease, unspecified: Secondary | ICD-10-CM | POA: Diagnosis not present

## 2022-05-02 ENCOUNTER — Ambulatory Visit (INDEPENDENT_AMBULATORY_CARE_PROVIDER_SITE_OTHER): Payer: Medicare Other | Admitting: Family Medicine

## 2022-05-02 ENCOUNTER — Encounter: Payer: Self-pay | Admitting: Family Medicine

## 2022-05-02 VITALS — BP 127/83 | HR 79 | Ht 73.0 in | Wt 235.1 lb

## 2022-05-02 DIAGNOSIS — E559 Vitamin D deficiency, unspecified: Secondary | ICD-10-CM | POA: Diagnosis not present

## 2022-05-02 DIAGNOSIS — I10 Essential (primary) hypertension: Secondary | ICD-10-CM | POA: Diagnosis not present

## 2022-05-02 DIAGNOSIS — E785 Hyperlipidemia, unspecified: Secondary | ICD-10-CM | POA: Diagnosis not present

## 2022-05-02 DIAGNOSIS — F17210 Nicotine dependence, cigarettes, uncomplicated: Secondary | ICD-10-CM | POA: Diagnosis not present

## 2022-05-02 DIAGNOSIS — N1832 Chronic kidney disease, stage 3b: Secondary | ICD-10-CM | POA: Diagnosis not present

## 2022-05-02 DIAGNOSIS — F411 Generalized anxiety disorder: Secondary | ICD-10-CM

## 2022-05-02 DIAGNOSIS — Z720 Tobacco use: Secondary | ICD-10-CM

## 2022-05-02 DIAGNOSIS — E669 Obesity, unspecified: Secondary | ICD-10-CM

## 2022-05-02 DIAGNOSIS — E1169 Type 2 diabetes mellitus with other specified complication: Secondary | ICD-10-CM

## 2022-05-02 DIAGNOSIS — M549 Dorsalgia, unspecified: Secondary | ICD-10-CM

## 2022-05-02 MED ORDER — KETOROLAC TROMETHAMINE 60 MG/2ML IM SOLN
60.0000 mg | Freq: Once | INTRAMUSCULAR | Status: AC
  Administered 2022-05-02: 60 mg via INTRAMUSCULAR

## 2022-05-02 NOTE — Assessment & Plan Note (Signed)
Controlled, no change in medication ?DASH diet and commitment to daily physical activity for a minimum of 30 minutes discussed and encouraged, as a part of hypertension management. ?The importance of attaining a healthy weight is also discussed. ? ? ?  05/02/2022  ? 10:29 AM 03/22/2022  ? 10:50 AM 02/03/2022  ?  8:39 AM 01/27/2022  ?  3:42 PM 01/27/2022  ?  3:06 PM 12/28/2021  ?  9:02 AM 12/14/2021  ? 10:21 AM  ?BP/Weight  ?Systolic BP 962 952 841 324 140 146 151  ?Diastolic BP 83 84 84 84 86 85 91  ?Wt. (Lbs) 235.12 238.12 243.2  241.12 245   ?BMI 31.02 kg/m2 31.42 kg/m2 32.09 kg/m2  31.81 kg/m2 32.32 kg/m2   ? ? ? ? ?

## 2022-05-02 NOTE — Assessment & Plan Note (Signed)
Refer to Nephriology ?

## 2022-05-02 NOTE — Progress Notes (Signed)
? ?Matthew Molina     MRN: [id number]      DOB: 06-05-1951 ? ? ?HPI ?Matthew Molina is here for follow up and re-evaluation of chronic medical conditions, medication management and review of any available recent lab and radiology data.  ?Preventive health is updated, specifically  Cancer screening and Immunization.   ?Questions or concerns regarding consultations or procedures which the PT has had in the interim are  addressed. ?The PT denies any adverse reactions to current medications since the last visit.  ?Denies polyuria, polydipsia, blurred vision , or hypoglycemic episodes. ?2 week h/o lBP rdidiating to right hip and thigh, wants aa shot for this  ? ?ROS ?Denies recent fever or chills. ?Denies sinus pressure, nasal congestion, ear pain or sore throat. ?Denies chest congestion, productive cough or wheezing. ?Denies chest pains, palpitations and leg swelling ?Denies abdominal pain, nausea, vomiting,diarrhea or constipation.   ?Denies dysuria, frequency, hesitancy or incontinen. ?Denies headaches, seizures, numbness, or tingling. ?Denies depression, anxiety or insomnia. ?Denies skin break down or rash. ? ? ?PE ? ?BP 127/83   Pulse 79   Ht '6\' 1"'$  (1.854 m)   Wt 235 lb 1.9 oz (106.6 kg)   SpO2 94%   BMI 31.02 kg/m?  ? ?Patient alert and oriented and in no cardiopulmonary distress. ? ?HEENT: No facial asymmetry, EOMI,     Neck supple . ? ?Chest: Clear to auscultation bilaterally.Decreased air entry bilaterally ? ?CVS: S1, S2 no murmurs, no S3.Regular rate. ? ?ABD: Soft non tender.  ? ?Ext: No edema ? ?MS: decreased ROM lumbar ROM spine, adequate in shoulders, hips and decreased in knees. ? ?Skin: Intact, no ulcerations or rash noted. ? ?Psych: Good eye contact, normal affect. Memory intact not anxious or depressed appearing. ? ?CNS: CN 2-12 intact, power,  normal throughout.no focal deficits noted. ? ? ?Assessment & Plan ? ?Hypertension ?Controlled, no change in medication ?DASH diet and commitment to daily physical  activity for a minimum of 30 minutes discussed and encouraged, as a part of hypertension management. ?The importance of attaining a healthy weight is also discussed. ? ? ?  05/02/2022  ? 10:29 AM 03/22/2022  ? 10:50 AM 02/03/2022  ?  8:39 AM 01/27/2022  ?  3:42 PM 01/27/2022  ?  3:06 PM 12/28/2021  ?  9:02 AM 12/14/2021  ? 10:21 AM  ?BP/Weight  ?Systolic BP 449 675 916 384 140 146 151  ?Diastolic BP 83 84 84 84 86 85 91  ?Wt. (Lbs) 235.12 238.12 243.2  241.12 245   ?BMI 31.02 kg/m2 31.42 kg/m2 32.09 kg/m2  31.81 kg/m2 32.32 kg/m2   ? ? ? ? ? ?Tobacco abuse ?Asked:confirms currently smokes cigarettes ?Assess: Unwilling to set a quit date, but is cutting back ?Advise: needs to QUIT to reduce risk of cancer, cardio and cerebrovascular disease ?Assist: counseled for 5 minutes and literature provided ?Arrange: follow up in 2 to 4 months ? ? ?Hyperlipidemia ?Hyperlipidemia:Low fat diet discussed and encouraged. ? ? ?Lipid Panel  ?Lab Results  ?Component Value Date  ? CHOL 145 04/26/2022  ? HDL 36 (L) 04/26/2022  ? [place] 76 04/26/2022  ? TRIG 199 (H) 04/26/2022  ? CHOLHDL 4.0 04/26/2022  ? ? ? ?Improvi9ng, still not at goal, no med change ? ?Back pain with radiation ?toradol 60 mg IM in office  ? ?Chronic kidney disease, stage 3b ([place]) ?Refer to Nephriology ? ?GAD (generalized anxiety disorder) ?Controlled, no change in medication ? ? ?Obesity (BMI 30.0-34.9) ? ?Patient re-educated  about  the importance of commitment to a  minimum of 150 minutes of exercise per week as able. ? ?The importance of healthy food choices with portion control discussed, as well as eating regularly and within a 12 hour window most days. ?The need to choose "clean , green" food 50 to 75% of the time is discussed, as well as to make water the primary drink and set a goal of 64 ounces water daily. ? ?  ? ?  05/02/2022  ? 10:29 AM 03/22/2022  ? 10:50 AM 02/03/2022  ?  8:39 AM  ?Weight /BMI  ?Weight 235 lb 1.9 oz 238 lb 1.9 oz 243 lb 3.2 oz  ?Height '6\' 1"'$   (1.854 m) '6\' 1"'$  (1.854 m) '6\' 1"'$  (1.854 m)  ?BMI 31.02 kg/m2 31.42 kg/m2 32.09 kg/m2  ? ? ?improved ? ?

## 2022-05-02 NOTE — Assessment & Plan Note (Signed)
?  Patient re-educated about  the importance of commitment to a  minimum of 150 minutes of exercise per week as able. ? ?The importance of healthy food choices with portion control discussed, as well as eating regularly and within a 12 hour window most days. ?The need to choose "clean , green" food 50 to 75% of the time is discussed, as well as to make water the primary drink and set a goal of 64 ounces water daily. ? ?  ? ?  05/02/2022  ? 10:29 AM 03/22/2022  ? 10:50 AM 02/03/2022  ?  8:39 AM  ?Weight /BMI  ?Weight 235 lb 1.9 oz 238 lb 1.9 oz 243 lb 3.2 oz  ?Height '6\' 1"'$  (1.854 m) '6\' 1"'$  (1.854 m) '6\' 1"'$  (1.854 m)  ?BMI 31.02 kg/m2 31.42 kg/m2 32.09 kg/m2  ? ? ?improved ?

## 2022-05-02 NOTE — Assessment & Plan Note (Signed)
toradol 60 mg IM in office  ?

## 2022-05-02 NOTE — Assessment & Plan Note (Signed)
Hyperlipidemia:Low fat diet discussed and encouraged. ? ? ?Lipid Panel  ?Lab Results  ?Component Value Date  ? CHOL 145 04/26/2022  ? HDL 36 (L) 04/26/2022  ? [place] 76 04/26/2022  ? TRIG 199 (H) 04/26/2022  ? CHOLHDL 4.0 04/26/2022  ? ? ? ?Improvi9ng, still not at goal, no med change ?

## 2022-05-02 NOTE — Assessment & Plan Note (Signed)
Asked:confirms currently smokes cigarettes °Assess: Unwilling to set a quit date, but is cutting back °Advise: needs to QUIT to reduce risk of cancer, cardio and cerebrovascular disease °Assist: counseled for 5 minutes and literature provided °Arrange: follow up in 2 to 4 months ° °

## 2022-05-02 NOTE — Patient Instructions (Addendum)
Annual exam 09/23 or after, call if you need me sooner ? ?CONGRATS on improved blood sugar and good blood pressure, noo medication changes ? ?Cut back every month by 1 cigarette ? ?Toradol 60 mg iM in in  office today for right sciatica ? ?Covid booster is recommended  for you, please get this ?Fasting lipid, cmp and EGFr, TSH and vit D,  3 to 5 days before next visit ? ?Nurse pls document most recent shingrix vaccine fromcA, states he has been told he has completed the requirement ? ?You are referred to Nephrologist due to chronic kidney disease ? ?It is important that you exercise regularly at least 30 minutes 5 times a week. If you develop chest pain, have severe difficulty breathing, or feel very tired, stop exercising immediately and seek medical attention  ? ?Thanks for choosing [facility], we consider it a privelige to serve you. ? ? ? ?

## 2022-05-02 NOTE — Assessment & Plan Note (Signed)
Controlled, no change in medication  

## 2022-05-17 ENCOUNTER — Other Ambulatory Visit: Payer: Self-pay | Admitting: Nurse Practitioner

## 2022-05-17 DIAGNOSIS — J449 Chronic obstructive pulmonary disease, unspecified: Secondary | ICD-10-CM

## 2022-05-27 DIAGNOSIS — E1122 Type 2 diabetes mellitus with diabetic chronic kidney disease: Secondary | ICD-10-CM | POA: Diagnosis not present

## 2022-05-27 DIAGNOSIS — N189 Chronic kidney disease, unspecified: Secondary | ICD-10-CM | POA: Diagnosis not present

## 2022-05-27 DIAGNOSIS — I129 Hypertensive chronic kidney disease with stage 1 through stage 4 chronic kidney disease, or unspecified chronic kidney disease: Secondary | ICD-10-CM | POA: Diagnosis not present

## 2022-05-27 DIAGNOSIS — R809 Proteinuria, unspecified: Secondary | ICD-10-CM | POA: Diagnosis not present

## 2022-05-27 DIAGNOSIS — J449 Chronic obstructive pulmonary disease, unspecified: Secondary | ICD-10-CM | POA: Diagnosis not present

## 2022-05-27 DIAGNOSIS — Z7189 Other specified counseling: Secondary | ICD-10-CM | POA: Diagnosis not present

## 2022-05-27 DIAGNOSIS — E559 Vitamin D deficiency, unspecified: Secondary | ICD-10-CM | POA: Diagnosis not present

## 2022-05-27 DIAGNOSIS — R3914 Feeling of incomplete bladder emptying: Secondary | ICD-10-CM | POA: Diagnosis not present

## 2022-05-27 DIAGNOSIS — N281 Cyst of kidney, acquired: Secondary | ICD-10-CM | POA: Diagnosis not present

## 2022-05-29 ENCOUNTER — Other Ambulatory Visit: Payer: Self-pay | Admitting: Family Medicine

## 2022-05-29 DIAGNOSIS — M25551 Pain in right hip: Secondary | ICD-10-CM

## 2022-05-30 DIAGNOSIS — I129 Hypertensive chronic kidney disease with stage 1 through stage 4 chronic kidney disease, or unspecified chronic kidney disease: Secondary | ICD-10-CM | POA: Diagnosis not present

## 2022-05-30 DIAGNOSIS — E1122 Type 2 diabetes mellitus with diabetic chronic kidney disease: Secondary | ICD-10-CM | POA: Diagnosis not present

## 2022-05-30 DIAGNOSIS — E559 Vitamin D deficiency, unspecified: Secondary | ICD-10-CM | POA: Diagnosis not present

## 2022-05-30 DIAGNOSIS — N189 Chronic kidney disease, unspecified: Secondary | ICD-10-CM | POA: Diagnosis not present

## 2022-06-01 DIAGNOSIS — J449 Chronic obstructive pulmonary disease, unspecified: Secondary | ICD-10-CM | POA: Diagnosis not present

## 2022-06-07 ENCOUNTER — Other Ambulatory Visit: Payer: Self-pay | Admitting: Nephrology

## 2022-06-07 ENCOUNTER — Other Ambulatory Visit: Payer: Medicare Other

## 2022-06-07 ENCOUNTER — Other Ambulatory Visit: Payer: Self-pay | Admitting: Family Medicine

## 2022-06-07 ENCOUNTER — Other Ambulatory Visit: Payer: Self-pay | Admitting: *Deleted

## 2022-06-07 ENCOUNTER — Other Ambulatory Visit (HOSPITAL_COMMUNITY): Payer: Self-pay | Admitting: Nephrology

## 2022-06-07 DIAGNOSIS — I129 Hypertensive chronic kidney disease with stage 1 through stage 4 chronic kidney disease, or unspecified chronic kidney disease: Secondary | ICD-10-CM

## 2022-06-07 DIAGNOSIS — R339 Retention of urine, unspecified: Secondary | ICD-10-CM

## 2022-06-07 DIAGNOSIS — N138 Other obstructive and reflux uropathy: Secondary | ICD-10-CM

## 2022-06-07 DIAGNOSIS — R972 Elevated prostate specific antigen [PSA]: Secondary | ICD-10-CM

## 2022-06-07 DIAGNOSIS — N281 Cyst of kidney, acquired: Secondary | ICD-10-CM

## 2022-06-07 DIAGNOSIS — E1122 Type 2 diabetes mellitus with diabetic chronic kidney disease: Secondary | ICD-10-CM

## 2022-06-08 LAB — PSA: Prostate Specific Ag, Serum: 5.2 ng/mL — ABNORMAL HIGH (ref 0.0–4.0)

## 2022-06-14 ENCOUNTER — Ambulatory Visit: Payer: Medicare Other | Admitting: Urology

## 2022-06-14 ENCOUNTER — Other Ambulatory Visit: Payer: Self-pay | Admitting: Family Medicine

## 2022-06-14 DIAGNOSIS — J449 Chronic obstructive pulmonary disease, unspecified: Secondary | ICD-10-CM

## 2022-06-16 ENCOUNTER — Other Ambulatory Visit (HOSPITAL_COMMUNITY): Payer: Medicare Other

## 2022-06-16 ENCOUNTER — Other Ambulatory Visit: Payer: Self-pay | Admitting: Family Medicine

## 2022-06-16 DIAGNOSIS — J449 Chronic obstructive pulmonary disease, unspecified: Secondary | ICD-10-CM

## 2022-06-22 ENCOUNTER — Ambulatory Visit (HOSPITAL_COMMUNITY)
Admission: RE | Admit: 2022-06-22 | Discharge: 2022-06-22 | Disposition: A | Discharge status: Home / Self Care | Payer: Medicare Other | Source: Ambulatory Visit | Attending: Nephrology | Admitting: Nephrology

## 2022-06-22 DIAGNOSIS — I129 Hypertensive chronic kidney disease with stage 1 through stage 4 chronic kidney disease, or unspecified chronic kidney disease: Secondary | ICD-10-CM | POA: Diagnosis not present

## 2022-06-22 DIAGNOSIS — E1122 Type 2 diabetes mellitus with diabetic chronic kidney disease: Secondary | ICD-10-CM | POA: Diagnosis not present

## 2022-06-22 DIAGNOSIS — R339 Retention of urine, unspecified: Secondary | ICD-10-CM | POA: Insufficient documentation

## 2022-06-22 DIAGNOSIS — N281 Cyst of kidney, acquired: Secondary | ICD-10-CM | POA: Diagnosis not present

## 2022-06-22 DIAGNOSIS — N189 Chronic kidney disease, unspecified: Secondary | ICD-10-CM | POA: Diagnosis not present

## 2022-06-26 ENCOUNTER — Encounter: Payer: Self-pay | Admitting: Urology

## 2022-06-26 ENCOUNTER — Other Ambulatory Visit: Payer: Self-pay | Admitting: Family Medicine

## 2022-06-26 ENCOUNTER — Ambulatory Visit (INDEPENDENT_AMBULATORY_CARE_PROVIDER_SITE_OTHER): Payer: Medicare Other | Admitting: Urology

## 2022-06-26 VITALS — BP 139/83 | HR 88

## 2022-06-26 DIAGNOSIS — R972 Elevated prostate specific antigen [PSA]: Secondary | ICD-10-CM | POA: Diagnosis not present

## 2022-06-26 DIAGNOSIS — M25551 Pain in right hip: Secondary | ICD-10-CM

## 2022-06-26 DIAGNOSIS — N401 Enlarged prostate with lower urinary tract symptoms: Secondary | ICD-10-CM

## 2022-06-26 DIAGNOSIS — N138 Other obstructive and reflux uropathy: Secondary | ICD-10-CM

## 2022-06-26 DIAGNOSIS — R339 Retention of urine, unspecified: Secondary | ICD-10-CM | POA: Diagnosis not present

## 2022-06-26 LAB — MICROSCOPIC EXAMINATION
Bacteria, UA: NONE SEEN
Renal Epithel, UA: NONE SEEN /hpf

## 2022-06-26 LAB — URINALYSIS, ROUTINE W REFLEX MICROSCOPIC
Bilirubin, UA: NEGATIVE
Glucose, UA: NEGATIVE
Ketones, UA: NEGATIVE
Nitrite, UA: NEGATIVE
Specific Gravity, UA: 1.02 (ref 1.005–1.030)
Urobilinogen, Ur: 1 mg/dL (ref 0.2–1.0)
pH, UA: 7 (ref 5.0–7.5)

## 2022-06-26 MED ORDER — TAMSULOSIN HCL 0.4 MG PO CAPS: 0.4000 mg | ORAL_CAPSULE | Freq: Every day | ORAL | 3 refills | Status: DC

## 2022-06-26 NOTE — Patient Instructions (Signed)

## 2022-06-26 NOTE — Progress Notes (Signed)
06/26/2022 11:04 AM   Matthew Molina 20-Nov-1951 [id number]  Referring provider: Fayrene Helper, MD [address] [place],  [place] [address]  Followup elevated PSA and BPH   HPI: [name] is a 71yo here for followup for elevated PSA and BPH. IPSS 17 QOl 3 on flomax 0.'4mg'$  daily. Nocturia 1-2x. PSA decreased to 5.2. Urine stream strong. No other complaints today   PMH: Past Medical History:  Diagnosis Date   Acute blood loss anemia 01/05/2020   Acute kidney injury ([place])    Acute respiratory failure with hypoxia ([facility])    Arthritis    COPD (chronic obstructive pulmonary disease) ([place])    Depression    Diabetes mellitus    Diabetes mellitus without complication ([place])    Diverticulitis    Elevated PSA 10/01/2016   Encounter for support and coordination of transition of care 01/14/2020   Hypercholesterolemia    Hyperlipidemia    Hypertension    Insomnia 01/05/2016   Ischemic colitis ([place]) 01/05/2020   Multiple lung nodules on CT 04/02/2015   Nicotine addiction    Obesity    Oxygen deficiency    qhs   Schizophrenia [facility])     Surgical History: Past Surgical History:  Procedure Laterality Date   CATARACT EXTRACTION W/PHACO Left 11/20/2013   Procedure: CATARACT EXTRACTION PHACO AND INTRAOCULAR LENS PLACEMENT ([place]);  Surgeon: Tonny Branch, MD;  Location: [facility] ORS;  Service: Ophthalmology;  Laterality: Left;  CDE:10.26   CATARACT EXTRACTION W/PHACO Right 12/08/2013   Procedure: RIGHT EYE CATARACT EXTRACTION PHACO AND INTRAOCULAR LENS PLACEMENT ;  Surgeon: Tonny Branch, MD;  Location: [facility] OR[facility];  Service: Ophthalmology;  Laterality: Right;  CDE 12.38   COLONOSCOPY N/A 04/01/2013   pancolonic diverticulosis, redundant colon, large internal hemorrhoids.    COLONOSCOPY  2014   INCOMPLETE PREP IN R COLON   COLONOSCOPY N/A 04/01/2020   Procedure: COLONOSCOPY;  Surgeon: Danie Binder, MD;  Location: [facility] SU[facility];  Service: Endoscopy;  Laterality: N/A;  8:45am    ESOPHAGOGASTRODUODENOSCOPY N/A 02/18/2016   stricture at GE junction, moderate erosive gastritis and mild non-erosive duodenitis. +H.pylori. Treated initially with Prevpac. Breath test to check for eradication was positive, and he was prescribed Pylera.    EYE SURGERY Left 11/2013   cataract extraction   POLYPECTOMY  04/01/2020   Procedure: POLYPECTOMY;  Surgeon: Danie Binder, MD;  Location: [facility];  Service: Endoscopy;;   SAVORY DILATION N/A 02/18/2016   Procedure: SAVORY DILATION;  Surgeon: Danie Binder, MD;  Location: [facility];  Service: Endoscopy;  Laterality: N/A;   SHOULDER SURGERY      Home Medications:  Allergies as of 06/26/2022       Reactions   Sertraline Hcl    Stomach upset/pain   Wellbutrin [bupropion] Other (See Comments)   Makes stomach hurt        Medication List        Accurate as of June 26, 2022 11:04 AM. If you have any questions, ask your nurse or doctor.          Accu-Chek Guide test strip Generic drug: glucose blood USE AS DIRECTED TO CHECK BLOOD SUGAR ONCE DAILY.   glucose blood test strip Use as instructed   Accu-Chek Softclix Lancets lancets TEST BLOOD SUGAR ONCE DAILY AS DIRECTED.   Lancets 30G Misc Once daily testing dx e11.9   acetaminophen 500 MG tablet Commonly known as: TYLENOL Take 500-1,000 mg by mouth as needed for headache.  Takes mostly for HA about 2 x/week.   albuterol 108 (90 Base) MCG/ACT inhaler Commonly known as: VENTOLIN HFA INHALE 2 PUFFS INTO THE LUNGS EVERY 6 HOURS AS NEEDED FOR WHEEZING OR SHORTNESS OF BREATH.   aspirin EC 81 MG tablet Take 81 mg by mouth daily. Swallow whole.   budesonide-formoterol 160-4.5 MCG/ACT inhaler Commonly known as: Symbicort Inhale 2 puffs into the lungs in the morning and at bedtime.   busPIRone 7.5 MG tablet Commonly known as: BUSPAR Take 1 tablet (7.5 mg total) by mouth 3 (three) times daily.   D-3-5 125 MCG (5000 UT) capsule Generic drug: Cholecalciferol Take  5,000 Units by mouth daily.   docusate sodium 100 MG capsule Commonly known as: COLACE Take 100 mg by mouth daily as needed for mild constipation.   fenofibrate 145 MG tablet Commonly known as: TRICOR TAKE 1 TABLET BY MOUTH DAILY. STOP TAKING EZETIMIBE.   glipiZIDE 10 MG 24 hr tablet Commonly known as: GLUCOTROL XL TAKE (2) TABLETS BY MOUTH DAILY EVERY MORNING AT BREAKFAST.   ipratropium-albuterol 0.5-2.5 (3) MG/3ML Soln Commonly known as: DUONEB USE 1 VIAL VIA NEBULIZER EVERY 4-6 HOURS.   meloxicam 7.5 MG tablet Commonly known as: MOBIC TAKE 1 TABLET BY MOUTH ONCE DAILY.   metFORMIN 500 MG 24 hr tablet Commonly known as: GLUCOPHAGE-XR Take 1 tablet (500 mg total) by mouth daily with breakfast.   montelukast 10 MG tablet Commonly known as: SINGULAIR TAKE (1) TABLET BY MOUTH AT BEDTIME.   olmesartan 40 MG tablet Commonly known as: BENICAR TAKE ONE TABLET BY MOUTH DAILY   pantoprazole 40 MG tablet Commonly known as: PROTONIX TAKE ONE TABLET BY MOUTH ONCE DAILY.   risperidone 4 MG tablet Commonly known as: RISPERDAL TAKE ONE TABLET BY MOUTH IN THE EVENING. TAKE WITH '1MG'$  TABLET.   risperiDONE 1 MG tablet Commonly known as: RISPERDAL TAKE ONE TABLET BY MOUTH IN THE EVENING. TAKE WITH '4MG'$  TABLET.   rosuvastatin 10 MG tablet Commonly known as: CRESTOR Take 1 tablet (10 mg total) by mouth daily.   sucralfate 1 g tablet Commonly known as: CARAFATE TAKE 1 TABLET BY MOUTH FOUR TIMES A DAY AS NEEDED.   tamsulosin 0.4 MG Caps capsule Commonly known as: FLOMAX Take 1 capsule (0.4 mg total) by mouth daily after supper.   traZODone 50 MG tablet Commonly known as: DESYREL TAKE ONE TABLET BY MOUTH AT BEDTIME AS NEEDED FOR SLEEP.   triamterene-hydrochlorothiazide 37.5-25 MG tablet Commonly known as: MAXZIDE-25 TAKE (1/2) TABLET BY MOUTH ONCE DAILY.   vitamin B-12 1000 MCG tablet Commonly known as: CYANOCOBALAMIN Take 1,000 mcg by mouth daily.        Allergies:   Allergies  Allergen Reactions   Sertraline Hcl     Stomach upset/pain   Wellbutrin [Bupropion] Other (See Comments)    Makes stomach hurt    Family History: Family History  Problem Relation Age of Onset   Diabetes Mother    Hypertension Mother    Stroke Mother    Diabetes Sister    Heart disease Sister    Kidney disease Father    Drug abuse Brother    Colon cancer Neg Hx    Colon polyps Neg Hx     Social History:  reports that he has been smoking cigarettes. He has a 96.00 pack-year smoking history. He has never used smokeless tobacco. He reports current alcohol use. He reports that he does not use drugs.  ROS: All other review of systems were reviewed and are  negative except what is noted above in HPI  Physical Exam: BP 139/83   Pulse 88   Constitutional:  Alert and oriented, No acute distress. HEENT: [place] AT, moist mucus membranes.  Trachea midline, no masses. Cardiovascular: No clubbing, cyanosis, or edema. Respiratory: Normal respiratory effort, no increased work of breathing. GI: Abdomen is soft, nontender, nondistended, no abdominal masses GU: No CVA tenderness.  Lymph: No cervical or inguinal lymphadenopathy. Skin: No rashes, bruises or suspicious lesions. Neurologic: Grossly intact, no focal deficits, moving all 4 extremities. Psychiatric: Normal mood and affect.  Laboratory Data: Lab Results  Component Value Date   WBC 6.4 01/05/2022   HGB 14.1 01/05/2022   HCT 43.9 01/05/2022   MCV 92.2 01/05/2022   PLT 204 01/05/2022    Lab Results  Component Value Date   CREATININE 1.74 (H) 04/26/2022    Lab Results  Component Value Date   PSA 9.3 (H) 05/26/2020   PSA 5.9 (H) 07/05/2017   PSA 4.96 (H) 06/05/2016    No results found for: "TESTOSTERONE"  Lab Results  Component Value Date   HGBA1C 6.7 (H) 04/26/2022    Urinalysis    Component Value Date/Time   COLORURINE lt. yellow 02/25/2009 1257   APPEARANCEUR Clear 09/08/2020 1206   LABSPEC 1.010  02/25/2009 1257   PHURINE 6.0 02/25/2009 1257   GLUCOSEU Negative 09/08/2020 1206   HGBUR negative 02/25/2009 1257   BILIRUBINUR Negative 09/08/2020 1206   PROTEINUR Negative 09/08/2020 1206   UROBILINOGEN 0.2 02/25/2009 1257   NITRITE Negative 09/08/2020 1206   NITRITE negative 02/25/2009 1257   LEUKOCYTESUR Negative 09/08/2020 1206    Lab Results  Component Value Date   LABMICR 69.4 09/15/2021    Pertinent Imaging:  No results found for this or any previous visit.  No results found for this or any previous visit.  No results found for this or any previous visit.  No results found for this or any previous visit.  Results for orders placed during the hospital encounter of 06/22/22  US RENAL  Narrative CLINICAL DATA:  ckd  EXAM: RENAL / URINARY TRACT ULTRASOUND COMPLETE  COMPARISON:  CT abdomen pelvis 03/10/2020  FINDINGS: Right Kidney:  Renal measurements: 12.2 x 7 x 4.1 cm = volume: 186 mL. Echogenicity within normal limits. Simple appearing cystic lesions measuring up to 4 cm. No mass or hydronephrosis visualized.  Left Kidney:  Renal measurements: 10.9 x 6.8 x 4.6 cm = volume: 221 mL. Echogenicity within normal limits. Simple appearing cystic lesion measuring up to 1.6 cm. No mass or hydronephrosis visualized.  Urinary bladder:  Appears normal for degree of bladder distention. Prevoid volume of 90 mL. Postvoid volume of 63 mL.  Other:  None.  IMPRESSION: 1. Unremarkable renal ultrasound. Simple renal cysts, in the absence of clinically indicated signs/symptoms, require no independent follow-up. 2. Urinary bladder: Prevoid volume of 90 mL. Postvoid volume of 63 mL.   Electronically Signed By: Iven Finn M.D. On: 06/22/2022 22:30  No results found for this or any previous visit.  No results found for this or any previous visit.  No results found for this or any previous visit.   Assessment & Plan:    1. Elevated PSA -RTC 1 year  with PSA - Urinalysis, Routine w reflex microscopic  2. Benign prostatic hyperplasia with urinary obstruction Continue flomax 0.'4mg'$  daily  3. Incomplete emptying of bladder -continue flomax 0.'4mg'$  daily   No follow-ups on file.  Nicolette Bang, MD  [facility] [place]

## 2022-06-28 ENCOUNTER — Other Ambulatory Visit: Payer: Self-pay | Admitting: Family Medicine

## 2022-06-29 DIAGNOSIS — R3914 Feeling of incomplete bladder emptying: Secondary | ICD-10-CM | POA: Diagnosis not present

## 2022-06-29 DIAGNOSIS — N189 Chronic kidney disease, unspecified: Secondary | ICD-10-CM | POA: Diagnosis not present

## 2022-06-29 DIAGNOSIS — E1122 Type 2 diabetes mellitus with diabetic chronic kidney disease: Secondary | ICD-10-CM | POA: Diagnosis not present

## 2022-06-29 DIAGNOSIS — N281 Cyst of kidney, acquired: Secondary | ICD-10-CM | POA: Diagnosis not present

## 2022-06-30 ENCOUNTER — Telehealth: Payer: Self-pay | Admitting: Family Medicine

## 2022-06-30 NOTE — Telephone Encounter (Signed)
Patient called in regard to nebulizer.  Patients current nebulizer has stopped working and can no longer be used  . Patient is requesting script for new nebulizer machine to be sent in. Can call patient back.

## 2022-07-01 DIAGNOSIS — J449 Chronic obstructive pulmonary disease, unspecified: Secondary | ICD-10-CM | POA: Diagnosis not present

## 2022-07-04 ENCOUNTER — Other Ambulatory Visit: Payer: Self-pay

## 2022-07-04 DIAGNOSIS — N281 Cyst of kidney, acquired: Secondary | ICD-10-CM | POA: Diagnosis not present

## 2022-07-04 DIAGNOSIS — R809 Proteinuria, unspecified: Secondary | ICD-10-CM | POA: Diagnosis not present

## 2022-07-04 DIAGNOSIS — N189 Chronic kidney disease, unspecified: Secondary | ICD-10-CM | POA: Diagnosis not present

## 2022-07-04 DIAGNOSIS — J9611 Chronic respiratory failure with hypoxia: Secondary | ICD-10-CM | POA: Diagnosis not present

## 2022-07-04 DIAGNOSIS — E1122 Type 2 diabetes mellitus with diabetic chronic kidney disease: Secondary | ICD-10-CM | POA: Diagnosis not present

## 2022-07-04 DIAGNOSIS — J449 Chronic obstructive pulmonary disease, unspecified: Secondary | ICD-10-CM | POA: Diagnosis not present

## 2022-07-04 DIAGNOSIS — E559 Vitamin D deficiency, unspecified: Secondary | ICD-10-CM | POA: Diagnosis not present

## 2022-07-04 DIAGNOSIS — R3914 Feeling of incomplete bladder emptying: Secondary | ICD-10-CM | POA: Diagnosis not present

## 2022-07-04 NOTE — Telephone Encounter (Signed)
Nebulizer machine ordered thru [facility] to be delivered to patients home.

## 2022-07-05 ENCOUNTER — Encounter: Payer: Self-pay | Admitting: Podiatry

## 2022-07-05 ENCOUNTER — Ambulatory Visit (INDEPENDENT_AMBULATORY_CARE_PROVIDER_SITE_OTHER): Payer: Medicare Other | Admitting: Podiatry

## 2022-07-05 DIAGNOSIS — E1149 Type 2 diabetes mellitus with other diabetic neurological complication: Secondary | ICD-10-CM | POA: Diagnosis not present

## 2022-07-05 DIAGNOSIS — M79675 Pain in left toe(s): Secondary | ICD-10-CM

## 2022-07-05 DIAGNOSIS — M2141 Flat foot [pes planus] (acquired), right foot: Secondary | ICD-10-CM

## 2022-07-05 DIAGNOSIS — M79674 Pain in right toe(s): Secondary | ICD-10-CM

## 2022-07-05 DIAGNOSIS — E1140 Type 2 diabetes mellitus with diabetic neuropathy, unspecified: Secondary | ICD-10-CM

## 2022-07-05 DIAGNOSIS — M79676 Pain in unspecified toe(s): Secondary | ICD-10-CM

## 2022-07-05 DIAGNOSIS — B351 Tinea unguium: Secondary | ICD-10-CM

## 2022-07-05 DIAGNOSIS — Q828 Other specified congenital malformations of skin: Secondary | ICD-10-CM

## 2022-07-05 NOTE — Progress Notes (Signed)
This patient returns to my office for at risk foot care.  This patient requires this care by a professional since this patient will be at risk due to having diabetes mellitus and chronic kidney disease.  Patient has pain in the two calluses on the bottom outside of his feet.  This patient is unable to cut nails himself since the patient cannot reach his nails.These nails are painful walking and wearing shoes. He cannot treat the painful calluses himself. . This patient presents for at risk foot care today.  General Appearance  Alert, conversant and in no acute stress.  Vascular  Dorsalis pedis and posterior tibial  pulses are palpable  bilaterally.  Capillary return is within normal limits  bilaterally. Temperature is within normal limits  bilaterally.  Neurologic  Senn-Weinstein monofilament wire test diminished   bilaterally. Muscle power within normal limits bilaterally.  Nails Thick disfigured discolored nails with subungual debris  from hallux to fifth toes bilaterally. No evidence of bacterial infection or drainage bilaterally.  Orthopedic  No limitations of motion  feet .  No crepitus or effusions noted.  No bony pathology or digital deformities noted.  Skin  normotropic skin  noted bilaterally.  No signs of infections or ulcers noted.   Porokeratosis sub 5th metabase  B/L.  Onychomycosis  Pain in right toes  Pain in left toes  Porokeratosis  X 2.  Consent was obtained for treatment procedures.   Mechanical debridement of nails 1-5  bilaterally performed with a nail nipper.  Filed with dremel without incident. Debridement of porokeratosis  with # 15 blade.     Return office visit   3 months                  Told patient to return for periodic foot care and evaluation due to potential at risk complications.   Gardiner Barefoot DPM

## 2022-07-06 ENCOUNTER — Other Ambulatory Visit: Payer: Self-pay | Admitting: Family Medicine

## 2022-07-07 ENCOUNTER — Other Ambulatory Visit: Payer: Self-pay | Admitting: Gastroenterology

## 2022-07-07 ENCOUNTER — Other Ambulatory Visit: Payer: Self-pay | Admitting: Family Medicine

## 2022-07-07 DIAGNOSIS — K649 Unspecified hemorrhoids: Secondary | ICD-10-CM

## 2022-07-07 DIAGNOSIS — K922 Gastrointestinal hemorrhage, unspecified: Secondary | ICD-10-CM

## 2022-07-07 DIAGNOSIS — R1084 Generalized abdominal pain: Secondary | ICD-10-CM

## 2022-07-07 DIAGNOSIS — K219 Gastro-esophageal reflux disease without esophagitis: Secondary | ICD-10-CM

## 2022-07-07 DIAGNOSIS — F209 Schizophrenia, unspecified: Secondary | ICD-10-CM

## 2022-07-20 ENCOUNTER — Other Ambulatory Visit: Payer: Self-pay | Admitting: Family Medicine

## 2022-07-26 ENCOUNTER — Other Ambulatory Visit: Payer: Self-pay | Admitting: Family Medicine

## 2022-07-27 DIAGNOSIS — D472 Monoclonal gammopathy: Secondary | ICD-10-CM | POA: Diagnosis not present

## 2022-07-27 DIAGNOSIS — E1129 Type 2 diabetes mellitus with other diabetic kidney complication: Secondary | ICD-10-CM | POA: Diagnosis not present

## 2022-07-27 DIAGNOSIS — R809 Proteinuria, unspecified: Secondary | ICD-10-CM | POA: Diagnosis not present

## 2022-07-27 DIAGNOSIS — E1122 Type 2 diabetes mellitus with diabetic chronic kidney disease: Secondary | ICD-10-CM | POA: Diagnosis not present

## 2022-07-27 DIAGNOSIS — N189 Chronic kidney disease, unspecified: Secondary | ICD-10-CM | POA: Diagnosis not present

## 2022-08-01 DIAGNOSIS — J449 Chronic obstructive pulmonary disease, unspecified: Secondary | ICD-10-CM | POA: Diagnosis not present

## 2022-08-02 ENCOUNTER — Other Ambulatory Visit: Payer: Self-pay | Admitting: Family Medicine

## 2022-08-08 ENCOUNTER — Other Ambulatory Visit: Payer: Self-pay | Admitting: Family Medicine

## 2022-08-09 ENCOUNTER — Encounter: Payer: Self-pay | Admitting: Family Medicine

## 2022-08-09 ENCOUNTER — Ambulatory Visit (INDEPENDENT_AMBULATORY_CARE_PROVIDER_SITE_OTHER): Payer: Medicare Other | Admitting: Family Medicine

## 2022-08-09 VITALS — BP 126/81 | HR 100 | Ht 73.0 in | Wt 230.0 lb

## 2022-08-09 DIAGNOSIS — F17210 Nicotine dependence, cigarettes, uncomplicated: Secondary | ICD-10-CM

## 2022-08-09 DIAGNOSIS — F209 Schizophrenia, unspecified: Secondary | ICD-10-CM

## 2022-08-09 DIAGNOSIS — E669 Obesity, unspecified: Secondary | ICD-10-CM

## 2022-08-09 DIAGNOSIS — I10 Essential (primary) hypertension: Secondary | ICD-10-CM | POA: Diagnosis not present

## 2022-08-09 DIAGNOSIS — M25512 Pain in left shoulder: Secondary | ICD-10-CM | POA: Diagnosis not present

## 2022-08-09 DIAGNOSIS — G8929 Other chronic pain: Secondary | ICD-10-CM

## 2022-08-09 DIAGNOSIS — Z720 Tobacco use: Secondary | ICD-10-CM

## 2022-08-09 DIAGNOSIS — E1169 Type 2 diabetes mellitus with other specified complication: Secondary | ICD-10-CM | POA: Diagnosis not present

## 2022-08-09 LAB — POCT GLYCOSYLATED HEMOGLOBIN (HGB A1C): HbA1c, POC (controlled diabetic range): 7 % (ref 0.0–7.0)

## 2022-08-09 NOTE — Patient Instructions (Addendum)
Keep October appointment, flu vaccine at visit  Much improved blood sugar  Please check out [facility] to go several afternoons per week   Continue to limit and CUT BACK on cigarettes, now at 2 PPD  You are referred to Orthopedics re left shoulder  Fasting labs ordered on 5/16, to be drawn 3 to 4 days before your October appointment  Thanks for choosing [facility], we consider it a privelige to serve you.

## 2022-08-16 ENCOUNTER — Ambulatory Visit (INDEPENDENT_AMBULATORY_CARE_PROVIDER_SITE_OTHER): Payer: Medicare Other

## 2022-08-16 ENCOUNTER — Other Ambulatory Visit: Payer: Self-pay | Admitting: Family Medicine

## 2022-08-16 ENCOUNTER — Ambulatory Visit (INDEPENDENT_AMBULATORY_CARE_PROVIDER_SITE_OTHER): Payer: Medicare Other | Admitting: Orthopaedic Surgery

## 2022-08-16 ENCOUNTER — Encounter: Payer: Self-pay | Admitting: Orthopaedic Surgery

## 2022-08-16 VITALS — BP 148/79 | HR 86 | Ht 72.0 in | Wt 228.4 lb

## 2022-08-16 DIAGNOSIS — M25512 Pain in left shoulder: Secondary | ICD-10-CM | POA: Diagnosis not present

## 2022-08-16 DIAGNOSIS — S46212A Strain of muscle, fascia and tendon of other parts of biceps, left arm, initial encounter: Secondary | ICD-10-CM

## 2022-08-16 DIAGNOSIS — F17210 Nicotine dependence, cigarettes, uncomplicated: Secondary | ICD-10-CM | POA: Diagnosis not present

## 2022-08-16 DIAGNOSIS — J449 Chronic obstructive pulmonary disease, unspecified: Secondary | ICD-10-CM

## 2022-08-16 DIAGNOSIS — G8929 Other chronic pain: Secondary | ICD-10-CM | POA: Diagnosis not present

## 2022-08-16 DIAGNOSIS — M25552 Pain in left hip: Secondary | ICD-10-CM

## 2022-08-16 NOTE — Progress Notes (Signed)
My left shoulder hurts.  He has had some pain in the left shoulder for several months but about three weeks ago to two weeks ago he has had much more pain of the left shoulder and left upper arm.  He is a diabetic.  He has no trauma.  He has had some swelling of the left upper arm and tenderness.  He has no elbow pain.  He is right hand dominant.  He has no numbness.  He has a prominent biceps muscle on the left with bruising in the left upper arm.  Grips are normal.  ROM of the left shoulder is full but tender.  NV intact.  Reflexes normal.  X-rays of the left shoulder were done, reported separately.  Negative.  Encounter Diagnoses  Name Primary?   Chronic left shoulder pain Yes   Biceps tendon rupture, left, initial encounter    Nicotine dependence, cigarettes, uncomplicated    I feel he has a rupture of the long head of the biceps tendon with resultant prominence of the biceps in the upper arm and the bruising associated with this.  I have explained the findings to him.    He is on multiple medications including Mobic.  I have recommended Aspercreme, Voltaren gel or Biofreeze to the area.  I will see him in two weeks.  I would not recommend surgery to him.  Call if any problem.  Precautions discussed.  Electronically Signed Sanjuana Kava, MD 8/30/20238:55 AM

## 2022-08-16 NOTE — Patient Instructions (Addendum)
Aspercreme, Biofreeze, Blue Emu or Voltaren Gel over the counter 2-3 times daily. Rub into area well each use for best results.    Smoking Tobacco Information, Adult Smoking tobacco can be harmful to your health. Tobacco contains a toxic colorless chemical called nicotine. Nicotine causes changes in your brain that make you want more and more. This is called addiction. This can make it hard to stop smoking once you start. Tobacco also has other toxic chemicals that can hurt your body and raise your risk of many cancers. Menthol or "lite" tobacco or cigarette brands are not safer than regular brands. How can smoking tobacco affect me? Smoking tobacco puts you at risk for: Cancer. Smoking is most commonly associated with lung cancer, but can also lead to cancer in other parts of the body. Chronic obstructive pulmonary disease (COPD). This is a long-term lung condition that makes it hard to breathe. It also gets worse over time. High blood pressure (hypertension), heart disease, stroke, heart attack, and lung infections, such as pneumonia. Cataracts. This is when the lenses in the eyes become clouded. Digestive problems. This may include peptic ulcers, heartburn, and gastroesophageal reflux disease (GERD). Oral health problems, such as gum disease, mouth sores, and tooth loss. Loss of taste and smell. Smoking also affects how you look and smell. Smoking may cause: Wrinkles. Yellow or stained teeth, fingers, and fingernails. Bad breath. Bad-smelling clothes and hair. Smoking tobacco can also affect your social life, because: It may be challenging to find places to smoke when away from home. Many workplaces, [employer], hotels, and public places are tobacco-free. Smoking is expensive. This is due to the cost of tobacco and the long-term costs of treating health problems from smoking. Secondhand smoke may affect those around you. Secondhand smoke can cause lung cancer, breathing problems, and  heart disease. Children of smokers have a higher risk for: Sudden infant death syndrome (SIDS). Ear infections. Lung infections. What actions can I take to prevent health problems? Quit smoking  Do not start smoking. Quit if you already smoke. Do not replace cigarette smoking with vaping devices, such as e-cigarettes. Make a plan to quit smoking and commit to it. Look for programs to help you, and ask your health care provider for recommendations and ideas. Set a date and write down all the reasons you want to quit. Let your friends and family know you are quitting so they can help and support you. Consider finding friends who also want to quit. It can be easier to quit with someone else, so that you can support each other. Talk with your health care provider about using nicotine replacement medicines to help you quit. These include gum, lozenges, patches, sprays, or pills. If you try to quit but return to smoking, stay positive. It is common to slip up when you first quit, so take it one day at a time. Be prepared for cravings. When you feel the urge to smoke, chew gum or suck on hard candy. Lifestyle Stay busy. Take care of your body. Get plenty of exercise, eat a healthy diet, and drink plenty of water. Find ways to manage your stress, such as meditation, yoga, exercise, or time spent with friends and family. Ask your health care provider about having regular tests (screenings) to check for cancer. This may include blood tests, imaging tests, and other tests. Where to find support To get support to quit smoking, consider: Asking your health care provider for more information and resources. Joining a support group for  people who want to quit smoking in your local community. There are many effective programs that may help you to quit. Calling the smokefree.gov counselor helpline at 1-800-QUIT-NOW 929-456-8622). Where to find more information You may find more information about quitting  smoking from: Centers for Disease Control and Prevention: https://www.chang-huffman.com/ https://hall.com/: smokefree.gov American Lung Association: freedomfromsmoking.org Contact a health care provider if: You have problems breathing. Your lips, nose, or fingers turn blue. You have chest pain. You are coughing up blood. You feel like you will faint. You have other health changes that cause you to worry. Summary Smoking tobacco can negatively affect your health, the health of those around you, your finances, and your social life. Do not start smoking. Quit if you already smoke. If you need help quitting, ask your health care provider. Consider joining a support group for people in your local community who want to quit smoking. There are many effective programs that may help you to quit. This information is not intended to replace advice given to you by your health care provider. Make sure you discuss any questions you have with your health care provider. Document Revised: 11/29/2021 Document Reviewed: 11/29/2021 Elsevier Patient Education  [place].

## 2022-08-20 ENCOUNTER — Encounter: Payer: Self-pay | Admitting: Family Medicine

## 2022-08-20 NOTE — Assessment & Plan Note (Signed)
Asked:confirms currently smokes cigarettes °Assess: Unwilling to set a quit date, but is cutting back °Advise: needs to QUIT to reduce risk of cancer, cardio and cerebrovascular disease °Assist: counseled for 5 minutes and literature provided °Arrange: follow up in 2 to 4 months ° °

## 2022-08-20 NOTE — Assessment & Plan Note (Signed)
Controlled, no change in medication Matthew Molina is reminded of the importance of commitment to daily physical activity for 30 minutes or more, as able and the need to limit carbohydrate intake to 30 to 60 grams per meal to help with blood sugar control.   The need to take medication as prescribed, test blood sugar as directed, and to call between visits if there is a concern that blood sugar is uncontrolled is also discussed.   Matthew Molina is reminded of the importance of daily foot exam, annual eye examination, and good blood sugar, blood pressure and cholesterol control.     Latest Ref Rng & Units 08/09/2022   10:26 AM 04/26/2022    8:21 AM 02/17/2022    9:09 AM 01/05/2022   12:00 PM 01/05/2022   11:58 AM  Diabetic Labs  HbA1c 0.0 - 7.0 % 7.0  6.7   8.0    Chol 100 - 199 mg/dL  145   197    HDL >39 mg/dL  36   39    Calc LDL 0 - 99 mg/dL  76   86    Triglycerides 0 - 149 mg/dL  199   360    Creatinine 0.76 - 1.27 mg/dL  1.74  1.66   1.38       08/16/2022    8:13 AM 08/09/2022    8:44 AM 06/26/2022   10:45 AM 05/02/2022   10:29 AM 03/22/2022   10:50 AM 02/03/2022    8:39 AM 01/27/2022    3:42 PM  BP/Weight  Systolic BP 768 088 110 315 945 859 292  Diastolic BP 79 81 83 83 84 84 84  Wt. (Lbs) 228.38 230  235.12 238.12 243.2   BMI 30.97 kg/m2 30.34 kg/m2  31.02 kg/m2 31.42 kg/m2 32.09 kg/m2       Latest Ref Rng & Units 03/14/2022    2:55 PM 02/17/2022   12:00 AM  Foot/eye exam completion dates  Eye Exam No Retinopathy No Retinopathy     No Retinopathy         This result is from an external source.

## 2022-08-20 NOTE — Assessment & Plan Note (Signed)
  Patient re-educated about  the importance of commitment to a  minimum of 150 minutes of exercise per week as able.  The importance of healthy food choices with portion control discussed, as well as eating regularly and within a 12 hour window most days. The need to choose "clean , green" food 50 to 75% of the time is discussed, as well as to make water the primary drink and set a goal of 64 ounces water daily.       08/16/2022    8:13 AM 08/09/2022    8:44 AM 05/02/2022   10:29 AM  Weight /BMI  Weight 228 lb 6 oz 230 lb 235 lb 1.9 oz  Height 6' (1.829 m) '6\' 1"'$  (1.854 m) '6\' 1"'$  (1.854 m)  BMI 30.97 kg/m2 30.34 kg/m2 31.02 kg/m2

## 2022-08-20 NOTE — Assessment & Plan Note (Signed)
Stable and controlled on current meds 

## 2022-08-20 NOTE — Assessment & Plan Note (Signed)
Increased pain with reduced mobility refer Ortho

## 2022-08-20 NOTE — Assessment & Plan Note (Signed)
Controlled, no change in medication DASH diet and commitment to daily physical activity for a minimum of 30 minutes discussed and encouraged, as a part of hypertension management. The importance of attaining a healthy weight is also discussed.     08/16/2022    8:13 AM 08/09/2022    8:44 AM 06/26/2022   10:45 AM 05/02/2022   10:29 AM 03/22/2022   10:50 AM 02/03/2022    8:39 AM 01/27/2022    3:42 PM  BP/Weight  Systolic BP 096 438 381 840 375 436 067  Diastolic BP 79 81 83 83 84 84 84  Wt. (Lbs) 228.38 230  235.12 238.12 243.2   BMI 30.97 kg/m2 30.34 kg/m2  31.02 kg/m2 31.42 kg/m2 32.09 kg/m2

## 2022-08-20 NOTE — Progress Notes (Signed)
Matthew Molina     MRN: [id number]      DOB: [birth year]   HPI Matthew Molina is here for follow up and re-evaluation of chronic medical conditions, medication management and review of any available recent lab and radiology data.  Preventive health is updated, specifically  Cancer screening and Immunization.   Questions or concerns regarding consultations or procedures which the PT has had in the interim are  addressed. The PT denies any adverse reactions to current medications since the last visit.  C/o increased left shoulder pain and limited mobility, no inciting trauma Denies polyuria, polydipsia, blurred vision , or hypoglycemic episodes.    ROS Denies recent fever or chills. Denies sinus pressure, nasal congestion, ear pain or sore throat. Denies chest congestion, productive cough or wheezing. Denies chest pains, palpitations and leg swelling Denies abdominal pain, nausea, vomiting,diarrhea or constipation.   Denies dysuria, frequency, hesitancy or incontinence. . Denies headaches, seizures,  Denies uncontrolled depression, anxiety or insomnia. Denies skin break down or rash.   PE  BP 126/81 (BP Location: Left Arm, Patient Position: Sitting, Cuff Size: Large)   Pulse 100   Ht '6\' 1"'$  (1.854 m)   Wt 230 lb (104.3 kg)   SpO2 94%   BMI 30.34 kg/m   Patient alert and oriented and in no cardiopulmonary distress.  HEENT: No facial asymmetry, EOMI,     Neck supple .  Chest: Clear to auscultation bilaterally.decreased air ntry  CVS: S1, S2 no murmurs, no S3.Regular rate.  ABD: Soft non tender.   Ext: No edema  MS: decreased  ROM spine, shoulders, hips and knees.  Skin: Intact, no ulcerations or rash noted.  Psych: Good eye contact, normal affect. Memory intact not anxious or depressed appearing.  CNS: CN 2-12 intact, power,  normal throughout.no focal deficits noted.   Assessment & Plan  Hypertension Controlled, no change in medication DASH diet and commitment to  daily physical activity for a minimum of 30 minutes discussed and encouraged, as a part of hypertension management. The importance of attaining a healthy weight is also discussed.     08/16/2022    8:13 AM 08/09/2022    8:44 AM 06/26/2022   10:45 AM 05/02/2022   10:29 AM 03/22/2022   10:50 AM 02/03/2022    8:39 AM 01/27/2022    3:42 PM  BP/Weight  Systolic BP 428 768 115 726 203 559 741  Diastolic BP 79 81 83 83 84 84 84  Wt. (Lbs) 228.38 230  235.12 238.12 243.2   BMI 30.97 kg/m2 30.34 kg/m2  31.02 kg/m2 31.42 kg/m2 32.09 kg/m2        Schizophrenia, unspecified type ([place]) Stable and controlled on current meds  Obesity (BMI 30.0-34.9)  Patient re-educated about  the importance of commitment to a  minimum of 150 minutes of exercise per week as able.  The importance of healthy food choices with portion control discussed, as well as eating regularly and within a 12 hour window most days. The need to choose "clean , green" food 50 to 75% of the time is discussed, as well as to make water the primary drink and set a goal of 64 ounces water daily.       08/16/2022    8:13 AM 08/09/2022    8:44 AM 05/02/2022   10:29 AM  Weight /BMI  Weight 228 lb 6 oz 230 lb 235 lb 1.9 oz  Height 6' (1.829 m) '6\' 1"'$  (1.854 m) '6\' 1"'$  (1.854 m)  BMI 30.97 kg/m2 30.34 kg/m2 31.02 kg/m2      Tobacco abuse Asked:confirms currently smokes cigarettes Assess: Unwilling to set a quit date, but is cutting back Advise: needs to QUIT to reduce risk of cancer, cardio and cerebrovascular disease Assist: counseled for 5 minutes and literature provided Arrange: follow up in 2 to 4 months   Shoulder pain, left Increased pain with reduced mobility refer Ortho  Diabetes mellitus type 2 in obese ([facility]) Controlled, no change in medication Matthew Molina is reminded of the importance of commitment to daily physical activity for 30 minutes or more, as able and the need to limit carbohydrate intake to 30 to 60 grams per  meal to help with blood sugar control.   The need to take medication as prescribed, test blood sugar as directed, and to call between visits if there is a concern that blood sugar is uncontrolled is also discussed.   Matthew Molina is reminded of the importance of daily foot exam, annual eye examination, and good blood sugar, blood pressure and cholesterol control.     Latest Ref Rng & Units 08/09/2022   10:26 AM 04/26/2022    8:21 AM 02/17/2022    9:09 AM 01/05/2022   12:00 PM 01/05/2022   11:58 AM  Diabetic Labs  HbA1c 0.0 - 7.0 % 7.0  6.7   8.0    Chol 100 - 199 mg/dL  145   197    HDL >39 mg/dL  36   39    Calc LDL 0 - 99 mg/dL  76   86    Triglycerides 0 - 149 mg/dL  199   360    Creatinine 0.76 - 1.27 mg/dL  1.74  1.66   1.38       08/16/2022    8:13 AM 08/09/2022    8:44 AM 06/26/2022   10:45 AM 05/02/2022   10:29 AM 03/22/2022   10:50 AM 02/03/2022    8:39 AM 01/27/2022    3:42 PM  BP/Weight  Systolic BP 557 322 025 427 062 376 283  Diastolic BP 79 81 83 83 84 84 84  Wt. (Lbs) 228.38 230  235.12 238.12 243.2   BMI 30.97 kg/m2 30.34 kg/m2  31.02 kg/m2 31.42 kg/m2 32.09 kg/m2       Latest Ref Rng & Units 03/14/2022    2:55 PM 02/17/2022   12:00 AM  Foot/eye exam completion dates  Eye Exam No Retinopathy No Retinopathy     No Retinopathy         This result is from an external source.

## 2022-08-22 ENCOUNTER — Ambulatory Visit: Payer: Medicare Other | Admitting: Orthopaedic Surgery

## 2022-08-29 ENCOUNTER — Other Ambulatory Visit: Payer: Self-pay | Admitting: Family Medicine

## 2022-08-30 ENCOUNTER — Encounter: Payer: Self-pay | Admitting: Orthopaedic Surgery

## 2022-08-30 ENCOUNTER — Ambulatory Visit (INDEPENDENT_AMBULATORY_CARE_PROVIDER_SITE_OTHER): Payer: Medicare Other | Admitting: Orthopaedic Surgery

## 2022-08-30 DIAGNOSIS — S46212A Strain of muscle, fascia and tendon of other parts of biceps, left arm, initial encounter: Secondary | ICD-10-CM

## 2022-08-30 DIAGNOSIS — F17210 Nicotine dependence, cigarettes, uncomplicated: Secondary | ICD-10-CM | POA: Diagnosis not present

## 2022-08-30 NOTE — Progress Notes (Signed)
I have less pain.  He has less pain from the left biceps tendon rupture.  He has resolving ecchymosis and no redness.  He is using his arm better.  He has no numbness.  He has resolving ecchymosis of the left medial upper arm and prominent "knot" from the left biceps muscle belly.  NV intact. ROM of shoulder is full as is the elbow on the left.  Encounter Diagnoses  Name Primary?   Biceps tendon rupture, left, initial encounter Yes   Nicotine dependence, cigarettes, uncomplicated    Continue as he is doing.  Return in six weeks.  Call if any problem.  Precautions discussed.  Electronically Signed Sanjuana Kava, MD 9/13/202310:27 AM

## 2022-09-01 DIAGNOSIS — J449 Chronic obstructive pulmonary disease, unspecified: Secondary | ICD-10-CM | POA: Diagnosis not present

## 2022-09-11 ENCOUNTER — Ambulatory Visit
Admission: EM | Admit: 2022-09-11 | Discharge: 2022-09-11 | Disposition: A | Discharge status: Home / Self Care | Payer: Medicare Other

## 2022-09-11 ENCOUNTER — Emergency Department (HOSPITAL_COMMUNITY)
Admission: EM | Admit: 2022-09-11 | Discharge: 2022-09-11 | Disposition: A | Discharge status: Home / Self Care | Payer: Medicare Other | Attending: Emergency Medicine | Admitting: Emergency Medicine

## 2022-09-11 ENCOUNTER — Encounter (HOSPITAL_COMMUNITY): Payer: Self-pay | Admitting: Emergency Medicine

## 2022-09-11 ENCOUNTER — Other Ambulatory Visit: Payer: Self-pay | Admitting: Family Medicine

## 2022-09-11 ENCOUNTER — Other Ambulatory Visit: Payer: Self-pay

## 2022-09-11 DIAGNOSIS — R109 Unspecified abdominal pain: Secondary | ICD-10-CM | POA: Diagnosis not present

## 2022-09-11 DIAGNOSIS — Z5321 Procedure and treatment not carried out due to patient leaving prior to being seen by health care provider: Secondary | ICD-10-CM | POA: Diagnosis not present

## 2022-09-11 LAB — URINALYSIS, ROUTINE W REFLEX MICROSCOPIC
Bacteria, UA: NONE SEEN
Bilirubin Urine: NEGATIVE
Glucose, UA: >=500 mg/dL — AB
Ketones, ur: NEGATIVE mg/dL
Leukocytes,Ua: NEGATIVE
Nitrite: NEGATIVE
Protein, ur: NEGATIVE mg/dL
Specific Gravity, Urine: 1.018 (ref 1.005–1.030)
pH: 6 (ref 5.0–8.0)

## 2022-09-11 LAB — CBC
HCT: 44.2 % (ref 39.0–52.0)
Hemoglobin: 14.1 g/dL (ref 13.0–17.0)
MCH: 29.4 pg (ref 26.0–34.0)
MCHC: 31.9 g/dL (ref 30.0–36.0)
MCV: 92.3 fL (ref 80.0–100.0)
Platelets: 261 10*3/uL (ref 150–400)
RBC: 4.79 MIL/uL (ref 4.22–5.81)
RDW: 14.5 % (ref 11.5–15.5)
WBC: 6.1 10*3/uL (ref 4.0–10.5)
nRBC: 0 % (ref 0.0–0.2)

## 2022-09-11 LAB — COMPREHENSIVE METABOLIC PANEL
ALT: 37 U/L (ref 0–44)
AST: 32 U/L (ref 15–41)
Albumin: 4.8 g/dL (ref 3.5–5.0)
Alkaline Phosphatase: 33 U/L — ABNORMAL LOW (ref 38–126)
Anion gap: 8 (ref 5–15)
BUN: 22 mg/dL (ref 8–23)
CO2: 25 mmol/L (ref 22–32)
Calcium: 10.6 mg/dL — ABNORMAL HIGH (ref 8.9–10.3)
Chloride: 103 mmol/L (ref 98–111)
Creatinine, Ser: 1.9 mg/dL — ABNORMAL HIGH (ref 0.61–1.24)
GFR, Estimated: 37 mL/min — ABNORMAL LOW (ref >60)
Glucose, Bld: 114 mg/dL — ABNORMAL HIGH (ref 70–99)
Potassium: 3.8 mmol/L (ref 3.5–5.1)
Sodium: 136 mmol/L (ref 135–145)
Total Bilirubin: 0.5 mg/dL (ref 0.3–1.2)
Total Protein: 8.8 g/dL — ABNORMAL HIGH (ref 6.5–8.1)

## 2022-09-11 LAB — LIPASE, BLOOD: Lipase: 40 U/L (ref 11–51)

## 2022-09-11 NOTE — Discharge Instructions (Signed)
Please go directly to the emergency room for further evaluation of the abdominal pain

## 2022-09-11 NOTE — ED Triage Notes (Signed)
Pt reports abdominal pain x 2 weeks. Denied diarrhea, constipation dysuria. Abdominal pain is worse when eating.

## 2022-09-11 NOTE — ED Provider Notes (Signed)
[facility]    CSN: [id number] Arrival date & time: 09/11/22  0831      History   Chief Complaint Chief Complaint  Patient presents with   Abdominal Pain    HPI Matthew Molina is a 71 y.o. male.   Patient presents with male caregiver for abdominal pain that has been ongoing for the past couple of weeks.  Reports the pain comes on in the left side of his abdomen suddenly, stays for couple of minutes, then goes away.  Reports the pain is currently 6 out of 10.  Describes the pain as a sharp pain.  Denies any radiation of pain down his leg, to other sides of his belly.  He denies fever, nausea/vomiting, weight loss, diarrhea, blood in his stool, rash, dysuria/urinary frequency, hematuria.  He does endorse decreased appetite, constipation for which she takes a laxative as needed, heartburn, and frequent NSAID use.  Has been taking BC Goody's for the pain without much relief.  Reports the pain is worse with movement at times, worse with eating at times.    Past Medical History:  Diagnosis Date   Acute blood loss anemia 01/05/2020   Acute kidney injury ([place])    Acute respiratory failure with hypoxia (HCC)    Arthritis    COPD (chronic obstructive pulmonary disease) ([place])    Depression    Diabetes mellitus    Diabetes mellitus without complication ([place])    Diverticulitis    Elevated PSA 10/01/2016   Encounter for support and coordination of transition of care 01/14/2020   Hypercholesterolemia    Hyperlipidemia    Hypertension    Insomnia 01/05/2016   Ischemic colitis ([place]) 01/05/2020   Multiple lung nodules on CT 04/02/2015   Nicotine addiction    Obesity    Oxygen deficiency    qhs   Schizophrenia [facility])     Patient Active Problem List   Diagnosis Date Noted   Shoulder pain, left 08/09/2022   Arm pain 01/27/2022   Multiple environmental allergies 01/02/2022   Pes planus 08/03/2021   Abnormal LFTs 06/28/2021   Benign prostatic hyperplasia with urinary  obstruction 10/11/2020   Emphysema lung ([place]) 09/19/2020   Tubular adenoma of colon 09/13/2020   Incomplete emptying of bladder 09/08/2020   Musculoskeletal leg pain, right 08/11/2020   Hip pain, bilateral 08/11/2020   Hemorrhoids 03/18/2020   Muscle pain 01/22/2020   QT prolongation 01/14/2020   Chronic respiratory failure with hypoxia ([place]) 01/06/2020   Diverticulosis of colon    Urinary incontinence 10/08/2019   Unsteady gait 04/18/2017   Elevated PSA 10/01/2016   GERD (gastroesophageal reflux disease) 08/24/2016   Chronic kidney disease, stage 3b ([place]) 05/22/2016   COPD (chronic obstructive pulmonary disease) ([place]) 11/05/2015   Hypertension 11/05/2015   Tobacco abuse 07/24/2015   Coronary atherosclerosis of native coronary artery 04/02/2015   Multiple lung nodules on CT 04/02/2015   GAD (generalized anxiety disorder) 07/04/2012   Back pain with radiation 11/01/2011   Diabetes mellitus type 2 in obese ([place]) 04/17/2011   Hyperlipidemia 06/02/2008   Obesity (BMI 30.0-34.9) 06/02/2008   Schizophrenia, unspecified type ([place]) 06/02/2008    Past Surgical History:  Procedure Laterality Date   CATARACT EXTRACTION W/PHACO Left 11/20/2013   Procedure: CATARACT EXTRACTION PHACO AND INTRAOCULAR LENS PLACEMENT (IOC);  Surgeon: Tonny Branch, MD;  Location: [facility];  Service: Ophthalmology;  Laterality: Left;  CDE:10.26   CATARACT EXTRACTION W/PHACO Right 12/08/2013   Procedure: RIGHT EYE CATARACT EXTRACTION PHACO AND INTRAOCULAR  LENS PLACEMENT ;  Surgeon: Tonny Branch, MD;  Location: [facility];  Service: Ophthalmology;  Laterality: Right;  CDE 12.38   COLONOSCOPY N/A 04/01/2013   pancolonic diverticulosis, redundant colon, large internal hemorrhoids.    COLONOSCOPY  2014   INCOMPLETE PREP IN R COLON   COLONOSCOPY N/A 04/01/2020   Procedure: COLONOSCOPY;  Surgeon: Danie Binder, MD;  Location: [facility];  Service: Endoscopy;  Laterality: N/A;  8:45am   ESOPHAGOGASTRODUODENOSCOPY N/A 02/18/2016    stricture at GE junction, moderate erosive gastritis and mild non-erosive duodenitis. +H.pylori. Treated initially with Prevpac. Breath test to check for eradication was positive, and he was prescribed Pylera.    EYE SURGERY Left 11/2013   cataract extraction   POLYPECTOMY  04/01/2020   Procedure: POLYPECTOMY;  Surgeon: Danie Binder, MD;  Location: [facility];  Service: Endoscopy;;   SAVORY DILATION N/A 02/18/2016   Procedure: SAVORY DILATION;  Surgeon: Danie Binder, MD;  Location: [facility];  Service: Endoscopy;  Laterality: N/A;   SHOULDER SURGERY         Home Medications    Prior to Admission medications   Medication Sig Start Date End Date Taking? Authorizing Provider  ACCU-CHEK GUIDE test strip USE AS DIRECTED TO CHECK BLOOD SUGAR ONCE DAILY. 11/18/21   Fayrene Helper, MD  Accu-Chek Softclix Lancets lancets TEST BLOOD SUGAR ONCE DAILY AS DIRECTED. 07/07/19   Fayrene Helper, MD  acetaminophen (TYLENOL) 500 MG tablet Take 500-1,000 mg by mouth as needed for headache. Takes mostly for HA about 2 x/week.    [provider]  albuterol (VENTOLIN HFA) 108 (90 Base) MCG/ACT inhaler INHALE 2 PUFFS INTO THE LUNGS EVERY 6 HOURS AS NEEDED FOR WHEEZING OR SHORTNESS OF BREATH. 08/16/22   Fayrene Helper, MD  aspirin 81 MG EC tablet Take 81 mg by mouth daily. Swallow whole.    [provider]  budesonide-formoterol (SYMBICORT) 160-4.5 MCG/ACT inhaler Inhale 2 puffs into the lungs in the morning and at bedtime. 09/15/21   Fayrene Helper, MD  busPIRone (BUSPAR) 7.5 MG tablet Take 1 tablet (7.5 mg total) by mouth 3 (three) times daily. 04/28/22   Fayrene Helper, MD  Cholecalciferol (D-3-5) 125 MCG (5000 UT) capsule Take 5,000 Units by mouth daily.    [provider]  docusate sodium (COLACE) 100 MG capsule Take 100 mg by mouth daily as needed for mild constipation.     [provider]  fenofibrate (TRICOR) 145 MG tablet TAKE 1 TABLET BY  MOUTH DAILY. STOP TAKING EZETIMIBE. 08/08/22   Fayrene Helper, MD  glipiZIDE (GLUCOTROL XL) 10 MG 24 hr tablet TAKE (2) TABLETS BY MOUTH DAILY EVERY MORNING AT BREAKFAST. 07/07/22   Fayrene Helper, MD  glucose blood test strip Use as instructed 11/25/21   Paseda, Dewaine Conger, FNP  ipratropium-albuterol (DUONEB) 0.5-2.5 (3) MG/3ML SOLN INHALE 1 VIAL VIA NEBULIZER EVERY 4 TO 6 HOURS. 07/20/22   Fayrene Helper, MD  Lancets 30G MISC Once daily testing dx e11.9 11/25/21   Paseda, Dewaine Conger, FNP  meloxicam (MOBIC) 7.5 MG tablet TAKE 1 TABLET BY MOUTH ONCE DAILY. 08/16/22   Fayrene Helper, MD  metFORMIN (GLUCOPHAGE-XR) 500 MG 24 hr tablet Take 1 tablet (500 mg total) by mouth daily with breakfast. 02/15/22   Lindell Spar, MD  montelukast (SINGULAIR) 10 MG tablet TAKE (1) TABLET BY MOUTH AT BEDTIME. 08/29/22   Fayrene Helper, MD  olmesartan (BENICAR) 40 MG tablet TAKE  ONE TABLET BY MOUTH DAILY 06/26/22   Fayrene Helper, MD  pantoprazole (PROTONIX) 40 MG tablet TAKE ONE TABLET BY MOUTH ONCE DAILY. 07/07/22   Sherron Monday, NP  risperiDONE (RISPERDAL) 1 MG tablet TAKE ONE TABLET BY MOUTH IN THE EVENING. TAKE WITH '4MG'$  TABLET. 05/29/22   Fayrene Helper, MD  risperidone (RISPERDAL) 4 MG tablet TAKE ONE TABLET BY MOUTH IN THE EVENING. TAKE WITH '1MG'$  TABLET. 07/07/22   Fayrene Helper, MD  rosuvastatin (CRESTOR) 10 MG tablet Take 1 tablet (10 mg total) by mouth daily. 02/03/22 05/04/22  Fay Records, MD  sucralfate (CARAFATE) 1 g tablet TAKE 1 TABLET BY MOUTH FOUR TIMES A DAY AS NEEDED. 07/14/21   Lindell Spar, MD  tamsulosin (FLOMAX) 0.4 MG CAPS capsule Take 1 capsule (0.4 mg total) by mouth daily after supper. 06/26/22   McKenzie, Candee Furbish, MD  traZODone (DESYREL) 50 MG tablet TAKE ONE TABLET BY MOUTH AT BEDTIME AS NEEDED FOR SLEEP. 08/02/22   Fayrene Helper, MD  triamterene-hydrochlorothiazide (MAXZIDE-25) 37.5-25 MG tablet TAKE (1/2) TABLET BY MOUTH ONCE DAILY. 08/29/22    Fayrene Helper, MD  vitamin B-12 (CYANOCOBALAMIN) 1000 MCG tablet Take 1,000 mcg by mouth daily.    [provider]  sildenafil (VIAGRA) 100 MG tablet Take 100 mg by mouth. Take one tablet 30 mins before intercourse   03/11/12  [provider]    Family History Family History  Problem Relation Age of Onset   Diabetes Mother    Hypertension Mother    Stroke Mother    Diabetes Sister    Heart disease Sister    Kidney disease Father    Drug abuse Brother    Colon cancer Neg Hx    Colon polyps Neg Hx     Social History Social History   Tobacco Use   Smoking status: Every Day    Packs/day: 2.00    Years: 48.00    Total pack years: 96.00    Types: Cigarettes   Smokeless tobacco: Never  Vaping Use   Vaping Use: Never used  Substance Use Topics   Alcohol use: Yes    Alcohol/week: 0.0 standard drinks of alcohol    Comment: "little bit of beer"   Drug use: No     Allergies   Sertraline hcl and Wellbutrin [bupropion]   Review of Systems Review of Systems Per HPI  Physical Exam Triage Vital Signs ED Triage Vitals  Enc Vitals Group     BP 09/11/22 0956 114/78     Pulse Rate 09/11/22 0956 97     Resp 09/11/22 0956 20     Temp 09/11/22 0956 98.5 F (36.9 C)     Temp Source 09/11/22 0956 Oral     SpO2 09/11/22 0956 94 %     Weight --      Height --      Head Circumference --      Peak Flow --      Pain Score 09/11/22 0955 6     Pain Loc --      Pain Edu? --      Excl. in [place]? --    No data found.  Updated Vital Signs BP 114/78 (BP Location: Left Arm)   Pulse 97   Temp 98.5 F (36.9 C) (Oral)   Resp 20   SpO2 94%   Visual Acuity Right Eye Distance:   Left Eye Distance:   Bilateral Distance:    Right Eye  Near:   Left Eye Near:    Bilateral Near:     Physical Exam Vitals and nursing note reviewed.  Constitutional:      General: He is not in acute distress.    Appearance: He is well-developed. He is not toxic-appearing.   HENT:     Head: Normocephalic and atraumatic.     Mouth/Throat:     Mouth: Mucous membranes are moist.     Pharynx: Oropharynx is clear.  Pulmonary:     Effort: Pulmonary effort is normal. No respiratory distress.  Abdominal:     General: Abdomen is flat. Bowel sounds are decreased.     Palpations: Abdomen is soft.     Tenderness: There is abdominal tenderness in the right upper quadrant, right lower quadrant, left upper quadrant and left lower quadrant. There is guarding.  Skin:    General: Skin is warm and dry.     Capillary Refill: Capillary refill takes less than 2 seconds.     Coloration: Skin is not cyanotic, jaundiced or pale.  Neurological:     Mental Status: He is alert and oriented to person, place, and time.  Psychiatric:        Behavior: Behavior is cooperative.      UC Treatments / Results  Labs (all labs ordered are listed, but only abnormal results are displayed) Labs Reviewed - No data to display  EKG   Radiology No results found.  Procedures Procedures (including critical care time)  Medications Ordered in UC Medications - No data to display  Initial Impression / Assessment and Plan / UC Course  I have reviewed the triage vital signs and the nursing notes.  Pertinent labs & imaging results that were available during my care of the patient were reviewed by me and considered in my medical decision making (see chart for details).    Patient is well-appearing, normotensive, afebrile, not tachycardic, not tachypneic, oxygenating well on room air.  However, on examination, he has guarding of all 4 abdominal quadrants.  I am concerned for acute abdomen.  Differentials include cholecystitis, pancreatitis, appendicitis, among other etiologies.  Recommended further evaluation and management in the emergency room.  Patient is in agreement to this plan.  Patient stable to transport via private vehicle at this time. Final Clinical Impressions(s) / UC Diagnoses    Final diagnoses:  Abdominal pain, unspecified abdominal location     Discharge Instructions      Please go directly to the emergency room for further evaluation of the abdominal pain     ED Prescriptions   None    PDMP not reviewed this encounter.   Eulogio Bear, NP 09/11/22 1035

## 2022-09-11 NOTE — ED Triage Notes (Signed)
Pt to the ED after abdominal pain for the past two weeks. The pain is worse with eating.  UC sent here to be evaluated.

## 2022-09-18 ENCOUNTER — Other Ambulatory Visit: Payer: Self-pay | Admitting: Family Medicine

## 2022-09-18 DIAGNOSIS — J449 Chronic obstructive pulmonary disease, unspecified: Secondary | ICD-10-CM

## 2022-09-18 DIAGNOSIS — M25551 Pain in right hip: Secondary | ICD-10-CM

## 2022-09-19 ENCOUNTER — Encounter: Payer: Self-pay | Admitting: Family Medicine

## 2022-09-19 ENCOUNTER — Ambulatory Visit (INDEPENDENT_AMBULATORY_CARE_PROVIDER_SITE_OTHER): Payer: Medicare Other | Admitting: Family Medicine

## 2022-09-19 VITALS — BP 117/78 | HR 92 | Ht 73.0 in | Wt 226.1 lb

## 2022-09-19 DIAGNOSIS — I10 Essential (primary) hypertension: Secondary | ICD-10-CM | POA: Diagnosis not present

## 2022-09-19 DIAGNOSIS — Z23 Encounter for immunization: Secondary | ICD-10-CM

## 2022-09-19 DIAGNOSIS — Z720 Tobacco use: Secondary | ICD-10-CM | POA: Diagnosis not present

## 2022-09-19 DIAGNOSIS — M25551 Pain in right hip: Secondary | ICD-10-CM

## 2022-09-19 DIAGNOSIS — E559 Vitamin D deficiency, unspecified: Secondary | ICD-10-CM

## 2022-09-19 DIAGNOSIS — M549 Dorsalgia, unspecified: Secondary | ICD-10-CM

## 2022-09-19 DIAGNOSIS — E1169 Type 2 diabetes mellitus with other specified complication: Secondary | ICD-10-CM

## 2022-09-19 DIAGNOSIS — E785 Hyperlipidemia, unspecified: Secondary | ICD-10-CM

## 2022-09-19 DIAGNOSIS — E669 Obesity, unspecified: Secondary | ICD-10-CM

## 2022-09-19 DIAGNOSIS — Z0001 Encounter for general adult medical examination with abnormal findings: Secondary | ICD-10-CM | POA: Diagnosis not present

## 2022-09-19 MED ORDER — KETOROLAC TROMETHAMINE 60 MG/2ML IM SOLN
60.0000 mg | Freq: Once | INTRAMUSCULAR | Status: AC
  Administered 2022-09-19: 60 mg via INTRAMUSCULAR

## 2022-09-19 NOTE — Assessment & Plan Note (Signed)
Increased and uncontrolled, toradol 6o mg izm in office

## 2022-09-19 NOTE — Assessment & Plan Note (Addendum)
Uncontrolled.Toradol 60 mg IM  administered IM in the office

## 2022-09-19 NOTE — Progress Notes (Signed)
Matthew Molina     MRN: [id number]      DOB: [birth year]   HPI: Patient is in for annual physical exam. C/O uncontrolled back and hip pain, rated from 4 to 8 requests injection for this. Recent labs, if available are reviewed. Immunization is reviewed , and  updated if needed. Still smokes 2 pPD trying to cut back    PE; BP 117/78 (BP Location: Right Arm, Patient Position: Sitting, Cuff Size: Large)   Pulse 92   Ht '6\' 1"'$  (1.854 m)   Wt 226 lb 1.9 oz (102.6 kg)   SpO2 95%   BMI 29.83 kg/m   Pleasant male, alert and oriented x 3, in no cardio-pulmonary distress. Afebrile. HEENT No facial trauma or asymetry. Sinuses non tender. EOMI External ears normal,  Neck: supple, no adenopathy,JVD or thyromegaly.No bruits.  Chest: Clear to ascultation bilaterally.No crackles or wheezes. Non tender to palpation Decreased air entry  Cardiovascular system; Heart sounds normal,  S1 and  S2 ,no S3.  No murmur, or thrill. Apical beat not displaced Peripheral pulses normal.  Abdomen: Soft, non tender, no organomegaly or masses. No bruits. Bowel sounds normal. No guarding, tenderness or rebound.    Musculoskeletal exam: Decreased ROM of spine, hips , normal in  shoulders and knees. No deformity ,swelling or crepitus noted. No muscle wasting or atrophy.   Neurologic: Cranial nerves 2 to 12 intact. Power, tone ,sensation and reflexes normal throughout. No disturbance in gait. No tremor.  Skin: Intact, no ulceration, erythema , scaling or rash noted. Pigmentation normal throughout  Psych; Normal mood and affect. Judgement and concentration normal   Assessment & Plan:  Encounter for Medicare annual examination with abnormal findings Annual exam as documented. Counseling done  re healthy lifestyle involving commitment to 150 minutes exercise per week, heart healthy diet, and attaining healthy weight.The importance of adequate sleep also discussed. Regular seat belt use and  home safety, is also discussed. Changes in health habits are decided on by the patient with goals and time frames  set for achieving them. Immunization and cancer screening needs are specifically addressed at this visit.   Back pain with radiation Uncontrolled.Toradol 60 mg IM  administered IM in the office   Tobacco abuse Asked:confirms currently smokes cigarettes 2 PPD  Assess: Unwilling to set a quit date, but is cutting back Advise: needs to QUIT to reduce risk of cancer, cardio and cerebrovascular disease Assist: counseled for 5 minutes and literature provided Arrange: follow up in 2 to 4 months   Diabetes mellitus type 2 in obese [facility]) Matthew Molina is reminded of the importance of commitment to daily physical activity for 30 minutes or more, as able and the need to limit carbohydrate intake to 30 to 60 grams per meal to help with blood sugar control.   The need to take medication as prescribed, test blood sugar as directed, and to call between visits if there is a concern that blood sugar is uncontrolled is also discussed.   Matthew Molina is reminded of the importance of daily foot exam, annual eye examination, and good blood sugar, blood pressure and cholesterol control.     Latest Ref Rng & Units 09/11/2022   12:03 PM 08/09/2022   10:26 AM 04/26/2022    8:21 AM 02/17/2022    9:09 AM 01/05/2022   12:00 PM  Diabetic Labs  HbA1c 0.0 - 7.0 %  7.0  6.7   8.0   Chol 100 - 199 mg/dL  145   197   HDL >39 mg/dL   36   39   Calc LDL 0 - 99 mg/dL   76   86   Triglycerides 0 - 149 mg/dL   199   360   Creatinine 0.61 - 1.24 mg/dL 1.90   1.74  1.66        09/19/2022    9:24 AM 09/11/2022   11:31 AM 09/11/2022   11:29 AM 09/11/2022    9:56 AM 08/16/2022    8:13 AM 08/09/2022    8:44 AM 06/26/2022   10:45 AM  BP/Weight  Systolic BP 423 953  202 334 356 861  Diastolic BP 78 79  78 79 81 83  Wt. (Lbs) 226.12  228.4  228.38 230   BMI 29.83 kg/m2  30.98 kg/m2  30.97 kg/m2 30.34 kg/m2        Latest Ref Rng & Units 09/19/2022    9:00 AM 03/14/2022    2:55 PM  Foot/eye exam completion dates  Eye Exam No Retinopathy  No Retinopathy      Foot Form Completion  Done      This result is from an external source.      Updated lab needed at/ before next visit.   Hip pain, right Increased and uncontrolled, toradol 6o mg izm in office

## 2022-09-19 NOTE — Assessment & Plan Note (Signed)

## 2022-09-19 NOTE — Assessment & Plan Note (Signed)
Matthew Molina is reminded of the importance of commitment to daily physical activity for 30 minutes or more, as able and the need to limit carbohydrate intake to 30 to 60 grams per meal to help with blood sugar control.   The need to take medication as prescribed, test blood sugar as directed, and to call between visits if there is a concern that blood sugar is uncontrolled is also discussed.   Matthew Molina is reminded of the importance of daily foot exam, annual eye examination, and good blood sugar, blood pressure and cholesterol control.     Latest Ref Rng & Units 09/11/2022   12:03 PM 08/09/2022   10:26 AM 04/26/2022    8:21 AM 02/17/2022    9:09 AM 01/05/2022   12:00 PM  Diabetic Labs  HbA1c 0.0 - 7.0 %  7.0  6.7   8.0   Chol 100 - 199 mg/dL   145   197   HDL >39 mg/dL   36   39   Calc LDL 0 - 99 mg/dL   76   86   Triglycerides 0 - 149 mg/dL   199   360   Creatinine 0.61 - 1.24 mg/dL 1.90   1.74  1.66        09/19/2022    9:24 AM 09/11/2022   11:31 AM 09/11/2022   11:29 AM 09/11/2022    9:56 AM 08/16/2022    8:13 AM 08/09/2022    8:44 AM 06/26/2022   10:45 AM  BP/Weight  Systolic BP 009 381  829 937 169 678  Diastolic BP 78 79  78 79 81 83  Wt. (Lbs) 226.12  228.4  228.38 230   BMI 29.83 kg/m2  30.98 kg/m2  30.97 kg/m2 30.34 kg/m2       Latest Ref Rng & Units 09/19/2022    9:00 AM 03/14/2022    2:55 PM  Foot/eye exam completion dates  Eye Exam No Retinopathy  No Retinopathy      Foot Form Completion  Done      This result is from an external source.      Updated lab needed at/ before next visit.

## 2022-09-19 NOTE — Assessment & Plan Note (Signed)
Asked:confirms currently smokes cigarettes 2 PPD  Assess: Unwilling to set a quit date, but is cutting back Advise: needs to QUIT to reduce risk of cancer, cardio and cerebrovascular disease Assist: counseled for 5 minutes and literature provided Arrange: follow up in 2 to 4 months

## 2022-09-19 NOTE — Patient Instructions (Addendum)
F/U IN 02/2023, CALL IF YOU NEED ME SOONER  FLU VACCINE TODAY  TORADOL 60 MG IM IN OFFICE TODAY FOR BACK PAIN  FASTING LIPID, CMP AND EgfR, HBA1C, CBC, TSH VIT D FIRST WEEK IN DECEMBER  WORK ON CUTTING DOWN TO 1.5 PPD CIGARETTES , NOW SMOKING 2PPD.  EXCELLENT BP AND WEIGHT LOSS  Thanks for choosing [facility], we consider it a privelige to serve you.

## 2022-09-24 NOTE — Progress Notes (Unsigned)
[facility] [address] [place] [address]   CLINIC:  Medical Oncology/Hematology  CONSULT NOTE  Patient Care Team: Fayrene Helper, MD as PCP - General (Family Medicine) Fay Records, MD as Consulting Physician (Cardiology) Fayrene Helper, MD Fayrene Helper, MD (Family Medicine) Gardiner Barefoot, DPM as Consulting Physician (Podiatry) Madelin Headings, DO (Optometry) Irine Seal, MD as Attending Physician (Urology) Eloise Harman, DO as Consulting Physician (Internal Medicine)  CHIEF COMPLAINTS/PURPOSE OF CONSULTATION:  Evaluation of possible MGUS  HISTORY OF PRESENTING ILLNESS:  KENECHUKWU ECKSTEIN 71 y.o. male is here at the request of Dr. Theador Hawthorne for evaluation of possible MGUS.  He denies any new bone pain or recent fractures.  He denies any B symptoms such as fever, chills, night sweats, unintentional weight loss.  No new neurologic symptoms such as tinnitus, new-onset hearing loss, blurred vision, or dizziness.  Denies any numbness or tingling in hands or feet.  He has occasional headaches.  No history of thromboembolic events.  No new masses or lymphadenopathy per his report.  Patient has lost 15 pounds in the past 6 months secondary to intentional dietary changes.  His past medical history is otherwise significant for CKD stage IIIb secondary to diabetes, familial hypocalciuric hypercalcemia, chronic back pain, tobacco abuse, type 2 diabetes mellitus, COPD, hypertension, schizophrenia,  and obesity.  Patient lives at home alone but has a caregiver to assist him 4 hours each day 6 days of the week.  He continues to smoke 1-2 PPD cigarettes since age 50.  He has a remote history of drug use 50+ years ago, but denies any IV drug use.  He used to drink daily alcohol (1 to 2 cans of beer daily), but quit 4 months ago.  Many years ago, he reports that he worked in a [employer] but denies any major chemical exposures.  To the best of the patient's  knowledge, family history is negative for multiple myeloma, cancer, and blood disorders.   MEDICAL HISTORY:  Past Medical History:  Diagnosis Date   Acute blood loss anemia 01/05/2020   Acute kidney injury ([facility])    Acute respiratory failure with hypoxia (HCC)    Arthritis    COPD (chronic obstructive pulmonary disease) ([place] [facility])    Depression    Diabetes mellitus    Diabetes mellitus without complication ([facility])    Diverticulitis    Elevated PSA 10/01/2016   Encounter for support and coordination of transition of care 01/14/2020   Hypercholesterolemia    Hyperlipidemia    Hypertension    Insomnia 01/05/2016   Ischemic colitis ([facility]) 01/05/2020   Multiple lung nodules on CT 04/02/2015   Nicotine addiction    Obesity    Oxygen deficiency    qhs   Schizophrenia ([facility])     SURGICAL HISTORY: Past Surgical History:  Procedure Laterality Date   CATARACT EXTRACTION W/PHACO Left 11/20/2013   Procedure: CATARACT EXTRACTION PHACO AND INTRAOCULAR LENS PLACEMENT ([place]);  Surgeon: Tonny Branch, MD;  Location: [facility];  Service: Ophthalmology;  Laterality: Left;  CDE:10.26   CATARACT EXTRACTION W/PHACO Right 12/08/2013   Procedure: RIGHT EYE CATARACT EXTRACTION PHACO AND INTRAOCULAR LENS PLACEMENT ;  Surgeon: Tonny Branch, MD;  Location: [facility];  Service: Ophthalmology;  Laterality: Right;  CDE 12.38   COLONOSCOPY N/A 04/01/2013   pancolonic diverticulosis, redundant colon, large internal hemorrhoids.    COLONOSCOPY  2014   INCOMPLETE PREP IN R COLON   COLONOSCOPY N/A 04/01/2020   Procedure:  COLONOSCOPY;  Surgeon: Danie Binder, MD;  Location: [facility];  Service: Endoscopy;  Laterality: N/A;  8:45am   ESOPHAGOGASTRODUODENOSCOPY N/A 02/18/2016   stricture at GE junction, moderate erosive gastritis and mild non-erosive duodenitis. +H.pylori. Treated initially with Prevpac. Breath test to check for eradication was positive, and he was prescribed Pylera.    EYE SURGERY Left 11/2013   cataract extraction    POLYPECTOMY  04/01/2020   Procedure: POLYPECTOMY;  Surgeon: Danie Binder, MD;  Location: [facility];  Service: Endoscopy;;   SAVORY DILATION N/A 02/18/2016   Procedure: SAVORY DILATION;  Surgeon: Danie Binder, MD;  Location: [facility];  Service: Endoscopy;  Laterality: N/A;   SHOULDER SURGERY      SOCIAL HISTORY: Social History   Socioeconomic History   Marital status: Divorced    Spouse name: Not on file   Number of children: 2   Years of education: Not on file   Highest education level: Not on file  Occupational History   Occupation: retired from [employer]   Smoking status: Every Day    Packs/day: 2.00    Years: 48.00    Total pack years: 96.00    Types: Cigarettes   Smokeless tobacco: Never  Vaping Use   Vaping Use: Never used  Substance and Sexual Activity   Alcohol use: Yes    Alcohol/week: 0.0 standard drinks of alcohol    Comment: "little bit of beer"   Drug use: No   Sexual activity: Not Currently    Birth control/protection: None  Other Topics Concern   Not on file  Social History Narrative   ** Merged History Encounter **       ** Merged History Encounter **       Social Determinants of Health   Financial Resource Strain: Low Risk  (11/08/2021)   Overall Financial Resource Strain (CARDIA)    Difficulty of Paying Living Expenses: Not hard at all  Food Insecurity: No Food Insecurity (11/08/2021)   Hunger Vital Sign    Worried About Running Out of Food in the Last Year: Never true    Ran Out of Food in the Last Year: Never true  Transportation Needs: No Transportation Needs (11/08/2021)   PRAPARE - Hydrologist (Medical): No    Lack of Transportation (Non-Medical): No  Physical Activity: Inactive (11/08/2021)   Exercise Vital Sign    Days of Exercise per Week: 0 days    Minutes of Exercise per Session: 0 min  Stress: No Stress Concern Present (11/08/2021)   [place]    Feeling of Stress : Not at all  Social Connections: Moderately Integrated (11/08/2021)   Social Connection and Isolation Panel [NHANES]    Frequency of Communication with Friends and Family: Twice a week    Frequency of Social Gatherings with Friends and Family: Once a week    Attends Religious Services: 1 to 4 times per year    Active Member of [employer] or Organizations: Yes    Attends Archivist Meetings: 1 to 4 times per year    Marital Status: Divorced  Intimate Partner Violence: Not At Risk (11/08/2021)   Humiliation, Afraid, Rape, and Kick questionnaire    Fear of Current or Ex-Partner: No    Emotionally Abused: No    Physically Abused: No    Sexually Abused: No    FAMILY HISTORY: Family History  Problem Relation Age of Onset   Diabetes Mother    Hypertension Mother    Stroke Mother    Diabetes Sister    Heart disease Sister    Kidney disease Father    Drug abuse Brother    Colon cancer Neg Hx    Colon polyps Neg Hx     ALLERGIES:  is allergic to sertraline hcl and wellbutrin [bupropion].  MEDICATIONS:  Current Outpatient Medications  Medication Sig Dispense Refill   ACCU-CHEK GUIDE test strip USE AS DIRECTED TO CHECK BLOOD SUGAR ONCE DAILY. 50 strip 0   Accu-Chek Softclix Lancets lancets TEST BLOOD SUGAR ONCE DAILY AS DIRECTED. 100 each 5   acetaminophen (TYLENOL) 500 MG tablet Take 500-1,000 mg by mouth as needed for headache. Takes mostly for HA about 2 x/week.     albuterol (VENTOLIN HFA) 108 (90 Base) MCG/ACT inhaler INHALE 2 PUFFS INTO THE LUNGS EVERY 6 HOURS AS NEEDED FOR WHEEZING OR SHORTNESS OF BREATH. 8.5 g 0   aspirin 81 MG EC tablet Take 81 mg by mouth daily. Swallow whole.     busPIRone (BUSPAR) 7.5 MG tablet TAKE (1) TABLET BY MOUTH THREE TIMES A DAY. 90 tablet 0   Cholecalciferol (D-3-5) 125 MCG (5000 UT) capsule Take 5,000 Units by mouth daily.     docusate sodium (COLACE) 100 MG capsule Take 100 mg by mouth  daily as needed for mild constipation.      FARXIGA 5 MG TABS tablet Take 5 mg by mouth every morning.     fenofibrate (TRICOR) 145 MG tablet TAKE 1 TABLET BY MOUTH DAILY. STOP TAKING EZETIMIBE. 30 tablet 0   glipiZIDE (GLUCOTROL XL) 10 MG 24 hr tablet TAKE (2) TABLETS BY MOUTH DAILY EVERY MORNING AT BREAKFAST. 60 tablet 2   glucose blood test strip Use as instructed 100 each 12   ipratropium-albuterol (DUONEB) 0.5-2.5 (3) MG/3ML SOLN INHALE 1 VIAL VIA NEBULIZER EVERY 4 TO 6 HOURS. 540 mL 0   Lancets 30G MISC Once daily testing dx e11.9 100 each 5   meloxicam (MOBIC) 7.5 MG tablet TAKE 1 TABLET BY MOUTH ONCE DAILY. 30 tablet 0   montelukast (SINGULAIR) 10 MG tablet TAKE (1) TABLET BY MOUTH AT BEDTIME. 30 tablet 0   olmesartan (BENICAR) 40 MG tablet TAKE ONE TABLET BY MOUTH DAILY 90 tablet 0   pantoprazole (PROTONIX) 40 MG tablet TAKE ONE TABLET BY MOUTH ONCE DAILY. 30 tablet 5   risperiDONE (RISPERDAL) 1 MG tablet TAKE ONE TABLET BY MOUTH IN THE EVENING. TAKE WITH 4MG TABLET. 90 tablet 0   risperidone (RISPERDAL) 4 MG tablet TAKE ONE TABLET BY MOUTH IN THE EVENING. TAKE WITH 1MG TABLET. 90 tablet 0   rosuvastatin (CRESTOR) 10 MG tablet Take 1 tablet (10 mg total) by mouth daily. 90 tablet 3   sucralfate (CARAFATE) 1 g tablet TAKE 1 TABLET BY MOUTH FOUR TIMES A DAY AS NEEDED. 30 tablet 0   SYMBICORT 160-4.5 MCG/ACT inhaler INHALE 2 PUFFS INTO THE LUNGS ONCE IN THE MORNING AND AT BEDTIME. 10.2 g 0   tamsulosin (FLOMAX) 0.4 MG CAPS capsule Take 1 capsule (0.4 mg total) by mouth daily after supper. 90 capsule 3   traZODone (DESYREL) 50 MG tablet TAKE ONE TABLET BY MOUTH AT BEDTIME AS NEEDED FOR SLEEP. 90 tablet 0   triamterene-hydrochlorothiazide (MAXZIDE-25) 37.5-25 MG tablet TAKE (1/2) TABLET BY MOUTH ONCE DAILY. 45 tablet 0   vitamin B-12 (CYANOCOBALAMIN) 1000 MCG tablet Take 1,000 mcg by mouth daily.  No current facility-administered medications for this visit.    REVIEW OF  SYSTEMS: Review of Systems  Constitutional:  Positive for fatigue. Negative for appetite change, chills, diaphoresis, fever and unexpected weight change.  HENT:   Negative for lump/mass and nosebleeds.   Eyes:  Negative for eye problems.  Respiratory:  Positive for cough. Negative for hemoptysis and shortness of breath.   Cardiovascular:  Negative for chest pain, leg swelling and palpitations.  Gastrointestinal:  Negative for abdominal pain, blood in stool, constipation, diarrhea, nausea and vomiting.  Genitourinary:  Negative for hematuria.   Skin: Negative.   Neurological:  Positive for headaches. Negative for dizziness and light-headedness.  Hematological:  Does not bruise/bleed easily.      PHYSICAL EXAMINATION: ECOG PERFORMANCE STATUS: 1 - Symptomatic but completely ambulatory  There were no vitals filed for this visit. There were no vitals filed for this visit.  Physical Exam Constitutional:      Appearance: Normal appearance. He is obese.  HENT:     Head: Normocephalic and atraumatic.     Mouth/Throat:     Mouth: Mucous membranes are moist.  Eyes:     Extraocular Movements: Extraocular movements intact.     Pupils: Pupils are equal, round, and reactive to light.  Cardiovascular:     Rate and Rhythm: Normal rate and regular rhythm.     Pulses: Normal pulses.     Heart sounds: Normal heart sounds.  Pulmonary:     Effort: Pulmonary effort is normal.     Breath sounds: Normal breath sounds. Decreased air movement present.  Abdominal:     General: Bowel sounds are normal.     Palpations: Abdomen is soft.     Tenderness: There is no abdominal tenderness.  Musculoskeletal:        General: No swelling.     Right lower leg: No edema.     Left lower leg: No edema.  Lymphadenopathy:     Cervical: No cervical adenopathy.  Skin:    General: Skin is warm and dry.  Neurological:     General: No focal deficit present.     Mental Status: He is alert and oriented to person,  place, and time.  Psychiatric:        Mood and Affect: Mood normal.        Behavior: Behavior normal.       LABORATORY DATA:  I have reviewed the data as listed Recent Results (from the past 2160 hour(s))  POCT glycosylated hemoglobin (Hb A1C)     Status: None   Collection Time: 08/09/22 10:26 AM  Result Value Ref Range   Hemoglobin A1C     HbA1c POC (<> result, manual entry)     HbA1c, POC (prediabetic range)     HbA1c, POC (controlled diabetic range) 7.0 0.0 - 7.0 %  Urinalysis, Routine w reflex microscopic Urine, Clean Catch     Status: Abnormal   Collection Time: 09/11/22 11:31 AM  Result Value Ref Range   Color, Urine YELLOW YELLOW   APPearance CLEAR CLEAR   Specific Gravity, Urine 1.018 1.005 - 1.030   pH 6.0 5.0 - 8.0   Glucose, UA >=500 (A) NEGATIVE mg/dL   Hgb urine dipstick SMALL (A) NEGATIVE   Bilirubin Urine NEGATIVE NEGATIVE   Ketones, ur NEGATIVE NEGATIVE mg/dL   Protein, ur NEGATIVE NEGATIVE mg/dL   Nitrite NEGATIVE NEGATIVE   Leukocytes,Ua NEGATIVE NEGATIVE   RBC / HPF 0-5 0 - 5 RBC/hpf   WBC, UA 0-5  0 - 5 WBC/hpf   Bacteria, UA NONE SEEN NONE SEEN    Comment: Performed at [facility], [address], [place], [place] [address]  Lipase, blood     Status: None   Collection Time: 09/11/22 12:03 PM  Result Value Ref Range   Lipase 40 11 - 51 U/L    Comment: Performed at [facility], [address], [place], [place] [address]  Comprehensive metabolic panel     Status: Abnormal   Collection Time: 09/11/22 12:03 PM  Result Value Ref Range   Sodium 136 135 - 145 mmol/L   Potassium 3.8 3.5 - 5.1 mmol/L   Chloride 103 98 - 111 mmol/L   CO2 25 22 - 32 mmol/L   Glucose, Bld 114 (H) 70 - 99 mg/dL    Comment: Glucose reference range applies only to samples taken after fasting for at least 8 hours.   BUN 22 8 - 23 mg/dL   Creatinine, Ser 1.90 (H) 0.61 - 1.24 mg/dL   Calcium 10.6 (H) 8.9 - 10.3 mg/dL   Total Protein 8.8 (H) 6.5 - 8.1 g/dL   Albumin 4.8 3.5 - 5.0  g/dL   AST 32 15 - 41 U/L   ALT 37 0 - 44 U/L   Alkaline Phosphatase 33 (L) 38 - 126 U/L   Total Bilirubin 0.5 0.3 - 1.2 mg/dL   GFR, Estimated 37 (L) >60 mL/min    Comment: (NOTE) Calculated using the CKD-EPI Creatinine Equation (2021)    Anion gap 8 5 - 15    Comment: Performed at [facility], [address] [place], [place] [address]  CBC     Status: None   Collection Time: 09/11/22 12:03 PM  Result Value Ref Range   WBC 6.1 4.0 - 10.5 K/uL   RBC 4.79 4.22 - 5.81 MIL/uL   Hemoglobin 14.1 13.0 - 17.0 g/dL   HCT 44.2 39.0 - 52.0 %   MCV 92.3 80.0 - 100.0 fL   MCH 29.4 26.0 - 34.0 pg   MCHC 31.9 30.0 - 36.0 g/dL   RDW 14.5 11.5 - 15.5 %   Platelets 261 150 - 400 K/uL   nRBC 0.0 0.0 - 0.2 %    Comment: Performed at [facility], [address] [place],  [address]    RADIOGRAPHIC STUDIES: I have personally reviewed the radiological images as listed and agreed with the findings in the report. No results found.  ASSESSMENT & PLAN: 1.  Suspected MGUS - Labs sent by Dr. Theador Hawthorne from 07/04/2022 suggestive of possible monoclonal gammopathy as noted with urine immunofixation showing IgG kappa monoclonal protein, SPEP showing 1.0 g/dL spike in gamma globulin region.  Serum immunofixation confirms IgG kappa monoclonal protein.  Mildly elevated kappa free light chains 26.1 with normal ratio. - No new bone pain, neurologic changes, or B symptoms - Most recent CBC and CMP (07/04/2022) with normal Hgb, creatinine 1.62 (baseline CKD stage IIIb secondary to diabetes), and calcium 10.4 - PLAN: We will recheck MGUS/myeloma panel along with 24-hour urine study and skeletal survey. - Repeat labs and RTC in 3 weeks.    2.  Mild hypercalcemia - CMP from 07/04/2022 shows borderline elevated calcium at 10.4 - Low urine calcium at 38 - He does not take any calcium supplement at home. - PLAN: Findings suggest hypocalciuric hypercalcemia.  Will defer monitoring and management to Dr.  Theador Hawthorne.  3.  Tobacco abuse - Patient reports smoking 1-2 PPD cigarettes since age 57 - Most recent  LDCT chest (03/02/2022) was lung RADS 2, benign appearance/behavior but with incidental finding of stable 4.3 cm diameter ascending thoracic aorta (recommend continued attention on follow-up of annual LCS) - PLAN: Patient has already been referred for LDCT scans via his PCP.  Continue annual CT scans for lung cancer screening, as patient will qualify until age 57.  47.  Other history - PMH: CKD stage IIIb secondary to diabetes, familial hypocalciuric hypercalcemia, chronic back pain, tobacco abuse, type 2 diabetes mellitus, COPD, hypertension, schizophrenia,  and obesity - SOCIAL: Lives at home alone.  Has caregiver 6 days of the week.  Used to work in [employer], but denies any major chemical exposures. - SUBSTANCE: Smokes 1-2 PPD cigarettes since age 29.  Remote history of drug use 50+ years ago, denies any IVDU.  Prior alcohol use (1 to 2 cans of beer daily), quit in 2023. - FAMILY: Negative for multiple myeloma, cancer, and blood disorders.   All questions were answered. The patient knows to call the clinic with any problems, questions or concerns.   Medical decision making: Moderate  Time spent on visit: I spent 35 minutes counseling the patient face to face. The total time spent in the appointment was 50 minutes and more than 50% was on counseling.  I, Tarri Abernethy PA-C, have seen this patient in conjunction with Dr. Derek Jack.  Greater than 50% of visit was performed by Dr. Delton Coombes.   Harriett Rush, PA-C 09/25/2022 5:28 PM  DR. KATRAGADDA: I have independently evaluated this patient and formulated my own assessment and plan.  I agree with HPI, assessment and plan written by Casey Burkitt, PA-C.  Patient seen at the request of Dr. Theador Hawthorne for IgG kappa MGUS.  We discussed the diagnosis as well as prognosis of MGUS.  We have recommended further work-up with  24-hour urine, skeletal survey and beta-2 microglobulin.  We have also discussed surveillance plan every 6 months if there is no evidence of lytic lesions on skeletal survey and no other "crab" features.  He expresses understanding.

## 2022-09-25 ENCOUNTER — Ambulatory Visit (HOSPITAL_COMMUNITY)
Admission: RE | Admit: 2022-09-25 | Discharge: 2022-09-25 | Disposition: A | Discharge status: Home / Self Care | Payer: Medicare Other | Source: Ambulatory Visit | Attending: Physician Assistant | Admitting: Physician Assistant

## 2022-09-25 ENCOUNTER — Other Ambulatory Visit: Payer: Self-pay | Admitting: Family Medicine

## 2022-09-25 ENCOUNTER — Inpatient Hospital Stay: Payer: Medicare Other | Attending: Hematology | Admitting: Hematology

## 2022-09-25 ENCOUNTER — Inpatient Hospital Stay: Payer: Medicare Other

## 2022-09-25 ENCOUNTER — Encounter: Payer: Self-pay | Admitting: Hematology

## 2022-09-25 VITALS — BP 134/85 | HR 95 | Temp 98.5°F | Resp 18 | Ht 73.0 in | Wt 226.8 lb

## 2022-09-25 DIAGNOSIS — N1832 Chronic kidney disease, stage 3b: Secondary | ICD-10-CM | POA: Diagnosis not present

## 2022-09-25 DIAGNOSIS — F17210 Nicotine dependence, cigarettes, uncomplicated: Secondary | ICD-10-CM | POA: Insufficient documentation

## 2022-09-25 DIAGNOSIS — D472 Monoclonal gammopathy: Secondary | ICD-10-CM | POA: Insufficient documentation

## 2022-09-25 DIAGNOSIS — I129 Hypertensive chronic kidney disease with stage 1 through stage 4 chronic kidney disease, or unspecified chronic kidney disease: Secondary | ICD-10-CM | POA: Insufficient documentation

## 2022-09-25 DIAGNOSIS — R779 Abnormality of plasma protein, unspecified: Secondary | ICD-10-CM | POA: Insufficient documentation

## 2022-09-25 DIAGNOSIS — E1122 Type 2 diabetes mellitus with diabetic chronic kidney disease: Secondary | ICD-10-CM | POA: Diagnosis not present

## 2022-09-25 LAB — CBC WITH DIFFERENTIAL/PLATELET
Abs Immature Granulocytes: 0.01 10*3/uL (ref 0.00–0.07)
Basophils Absolute: 0 10*3/uL (ref 0.0–0.1)
Basophils Relative: 1 %
Eosinophils Absolute: 0.2 10*3/uL (ref 0.0–0.5)
Eosinophils Relative: 4 %
HCT: 39.5 % (ref 39.0–52.0)
Hemoglobin: 12.9 g/dL — ABNORMAL LOW (ref 13.0–17.0)
Immature Granulocytes: 0 %
Lymphocytes Relative: 31 %
Lymphs Abs: 2 10*3/uL (ref 0.7–4.0)
MCH: 29.8 pg (ref 26.0–34.0)
MCHC: 32.7 g/dL (ref 30.0–36.0)
MCV: 91.2 fL (ref 80.0–100.0)
Monocytes Absolute: 0.6 10*3/uL (ref 0.1–1.0)
Monocytes Relative: 9 %
Neutro Abs: 3.5 10*3/uL (ref 1.7–7.7)
Neutrophils Relative %: 55 %
Platelets: 241 10*3/uL (ref 150–400)
RBC: 4.33 MIL/uL (ref 4.22–5.81)
RDW: 14.5 % (ref 11.5–15.5)
WBC: 6.4 10*3/uL (ref 4.0–10.5)
nRBC: 0 % (ref 0.0–0.2)

## 2022-09-25 LAB — COMPREHENSIVE METABOLIC PANEL
ALT: 40 U/L (ref 0–44)
AST: 30 U/L (ref 15–41)
Albumin: 4.4 g/dL (ref 3.5–5.0)
Alkaline Phosphatase: 34 U/L — ABNORMAL LOW (ref 38–126)
Anion gap: 8 (ref 5–15)
BUN: 25 mg/dL — ABNORMAL HIGH (ref 8–23)
CO2: 23 mmol/L (ref 22–32)
Calcium: 10.5 mg/dL — ABNORMAL HIGH (ref 8.9–10.3)
Chloride: 105 mmol/L (ref 98–111)
Creatinine, Ser: 1.69 mg/dL — ABNORMAL HIGH (ref 0.61–1.24)
GFR, Estimated: 43 mL/min — ABNORMAL LOW (ref >60)
Glucose, Bld: 96 mg/dL (ref 70–99)
Potassium: 3.7 mmol/L (ref 3.5–5.1)
Sodium: 136 mmol/L (ref 135–145)
Total Bilirubin: 0.5 mg/dL (ref 0.3–1.2)
Total Protein: 8 g/dL (ref 6.5–8.1)

## 2022-09-25 LAB — LACTATE DEHYDROGENASE: LDH: 129 U/L (ref 98–192)

## 2022-09-25 NOTE — Patient Instructions (Signed)
Matthew Molina at [facility] **   You were seen today by Tarri Abernethy PA-C and Dr. Delton Coombes for your abnormal labs.    MGUS (Monoclonal Gammopathy of Undetermined Significance) Your labs showed an abnormal protein in your blood.  This protein does not appear to be causing any problems at this time, but can progress to a type of cancer called multiple myeloma in about 1% of patients each year. We will check labs today.  We will also check 24-hour urine sample and x-ray.  FOLLOW-UP APPOINTMENT: Office visit in 2-3 weeks to discuss results  ** Thank you for trusting me with your healthcare!  I strive to provide all of my patients with quality care at each visit.  If you receive a survey for this visit, I would be so grateful to you for taking the time to provide feedback.  Thank you in advance!  ~ Kavina Cantave                   Dr. Derek Jack   &   Tarri Abernethy, PA-C   - - - - - - - - - - - - - - - - - -    Thank you for choosing [facility] at [facility] to provide your oncology and hematology care.  To afford each patient quality time with our provider, please arrive at least 15 minutes before your scheduled appointment time.   If you have a lab appointment with the [facility] please come in thru the Main Entrance and check in at the main information desk.  You need to re-schedule your appointment should you arrive 10 or more minutes late.  We strive to give you quality time with our providers, and arriving late affects you and other patients whose appointments are after yours.  Also, if you no show three or more times for appointments you may be dismissed from the clinic at the providers discretion.     Again, thank you for choosing [facility].  Our hope is that these requests will decrease the amount of time that you wait before being seen by our physicians.        _____________________________________________________________  Should you have questions after your visit to [facility], please contact our office at [phone] and follow the prompts.  Our office hours are 8:00 a.m. and 4:30 p.m. Monday - Friday.  Please note that voicemails left after 4:00 p.m. may not be returned until the following business day.  We are closed weekends and major holidays.  You do have access to a nurse 24-7, just call the main number to the clinic [phone] and do not press any options, hold on the line and a nurse will answer the phone.    For prescription refill requests, have your pharmacy contact our office and allow 72 hours.

## 2022-09-26 ENCOUNTER — Other Ambulatory Visit: Payer: Self-pay | Admitting: Family Medicine

## 2022-09-26 LAB — KAPPA/LAMBDA LIGHT CHAINS
Kappa free light chain: 30.5 mg/L — ABNORMAL HIGH (ref 3.3–19.4)
Kappa, lambda light chain ratio: 1.29 (ref 0.26–1.65)
Lambda free light chains: 23.7 mg/L (ref 5.7–26.3)

## 2022-09-26 LAB — BETA 2 MICROGLOBULIN, SERUM: Beta-2 Microglobulin: 2.7 mg/L — ABNORMAL HIGH (ref 0.6–2.4)

## 2022-09-27 ENCOUNTER — Other Ambulatory Visit (HOSPITAL_COMMUNITY)
Admission: RE | Admit: 2022-09-27 | Discharge: 2022-09-27 | Disposition: A | Discharge status: Home / Self Care | Payer: Medicare Other | Source: Ambulatory Visit | Attending: Hematology | Admitting: Hematology

## 2022-09-27 DIAGNOSIS — D472 Monoclonal gammopathy: Secondary | ICD-10-CM | POA: Insufficient documentation

## 2022-09-27 DIAGNOSIS — R779 Abnormality of plasma protein, unspecified: Secondary | ICD-10-CM | POA: Diagnosis not present

## 2022-09-27 DIAGNOSIS — N1832 Chronic kidney disease, stage 3b: Secondary | ICD-10-CM | POA: Diagnosis not present

## 2022-09-27 DIAGNOSIS — F17210 Nicotine dependence, cigarettes, uncomplicated: Secondary | ICD-10-CM | POA: Diagnosis not present

## 2022-09-27 DIAGNOSIS — I129 Hypertensive chronic kidney disease with stage 1 through stage 4 chronic kidney disease, or unspecified chronic kidney disease: Secondary | ICD-10-CM | POA: Diagnosis not present

## 2022-09-27 DIAGNOSIS — E1122 Type 2 diabetes mellitus with diabetic chronic kidney disease: Secondary | ICD-10-CM | POA: Diagnosis not present

## 2022-09-28 LAB — PROTEIN ELECTROPHORESIS, SERUM
A/G Ratio: 1.1 (ref 0.7–1.7)
Albumin ELP: 3.9 g/dL (ref 2.9–4.4)
Alpha-1-Globulin: 0.2 g/dL (ref 0.0–0.4)
Alpha-2-Globulin: 0.9 g/dL (ref 0.4–1.0)
Beta Globulin: 0.9 g/dL (ref 0.7–1.3)
Gamma Globulin: 1.3 g/dL (ref 0.4–1.8)
Globulin, Total: 3.4 g/dL (ref 2.2–3.9)
M-Spike, %: 0.9 g/dL — ABNORMAL HIGH
Total Protein ELP: 7.3 g/dL (ref 6.0–8.5)

## 2022-10-01 DIAGNOSIS — J449 Chronic obstructive pulmonary disease, unspecified: Secondary | ICD-10-CM | POA: Diagnosis not present

## 2022-10-02 LAB — UPEP/UIFE/LIGHT CHAINS/TP, 24-HR UR
% BETA, Urine: 0 %
ALPHA 1 URINE: 0 %
Albumin, U: 100 %
Alpha 2, Urine: 0 %
Free Kappa Lt Chains,Ur: 11.68 mg/L (ref 1.17–86.46)
Free Kappa/Lambda Ratio: 3.87 (ref 1.83–14.26)
Free Lambda Lt Chains,Ur: 3.02 mg/L (ref 0.27–15.21)
GAMMA GLOBULIN URINE: 0 %
Total Protein, Urine-Ur/day: 103 mg/24 hr (ref 30–150)
Total Protein, Urine: 7.2 mg/dL
Total Volume: 1425

## 2022-10-02 LAB — IMMUNOFIXATION ELECTROPHORESIS
IgA: 160 mg/dL (ref 61–437)
IgG (Immunoglobin G), Serum: 1250 mg/dL (ref 603–1613)
IgM (Immunoglobulin M), Srm: 79 mg/dL (ref 15–143)
Total Protein ELP: 7.2 g/dL (ref 6.0–8.5)

## 2022-10-05 ENCOUNTER — Other Ambulatory Visit: Payer: Self-pay | Admitting: Family Medicine

## 2022-10-06 ENCOUNTER — Encounter: Payer: Medicare Other | Admitting: Family Medicine

## 2022-10-11 ENCOUNTER — Ambulatory Visit: Payer: Medicare Other | Admitting: Orthopaedic Surgery

## 2022-10-11 ENCOUNTER — Encounter: Payer: Self-pay | Admitting: Orthopaedic Surgery

## 2022-10-13 ENCOUNTER — Ambulatory Visit: Payer: Medicare Other | Admitting: Podiatry

## 2022-10-17 NOTE — Progress Notes (Unsigned)
[place], [place] [address]   CLINIC:  Medical Oncology/Hematology  PCP:  Fayrene Helper, MD [address] [place] [address] [phone]   REASON FOR VISIT:  Follow-up for IgG kappa MGUS  PRIOR THERAPY: None  CURRENT THERAPY: Surveillance  INTERVAL HISTORY:  [name] 71 y.o. male returns for routine follow-up of his recently diagnosed IgG kappa MGUS.  He was seen for initial consultation by Dr. Delton Coombes and Tarri Abernethy PA-C on 09/25/2022.  At today's visit, he reports feeling fairly well.  He denies any changes in his symptoms or health status since his visit 3 weeks ago.  He continues to deny any new bone pain or recent fractures.  He denies any B symptoms such as fever, chills, night sweats, unintentional weight loss.  No new neurologic symptoms such as tinnitus, new-onset hearing loss, blurred vision, or dizziness.  Denies any numbness or tingling in hands or feet.  He has occasional headaches.  No history of thromboembolic events.  No new masses or lymphadenopathy per his report.  Patient has lost 15 pounds in the past 6 months secondary to intentional dietary changes.  He has 80% energy and 90% appetite. He endorses that he is maintaining a stable weight.   REVIEW OF SYSTEMS:  Review of Systems  Constitutional:  Negative for appetite change, chills, diaphoresis, fatigue, fever and unexpected weight change.  HENT:   Negative for lump/mass and nosebleeds.   Eyes:  Negative for eye problems.  Respiratory:  Positive for shortness of breath (with exertion). Negative for cough and hemoptysis.   Cardiovascular:  Negative for chest pain, leg swelling and palpitations.  Gastrointestinal:  Negative for abdominal pain, blood in stool, constipation, diarrhea, nausea and vomiting.  Genitourinary:  Negative for hematuria.   Skin: Negative.   Neurological:  Positive for dizziness (occasional) and headaches  (occasional). Negative for light-headedness.  Hematological:  Does not bruise/bleed easily.  Psychiatric/Behavioral:  The patient is nervous/anxious.       PAST MEDICAL/SURGICAL HISTORY:  Past Medical History:  Diagnosis Date   Acute blood loss anemia 01/05/2020   Acute kidney injury ([place])    Acute respiratory failure with hypoxia (H[facility])    Arthritis    COPD (chronic obstructive pulmonary disease) ([facility])    Depression    Diabetes mellitus    Diabetes mellitus without complication ([place])    Diverticulitis    Elevated PSA 10/01/2016   Encounter for support and coordination of transition of care 01/14/2020   Hypercholesterolemia    Hyperlipidemia    Hypertension    Insomnia 01/05/2016   Ischemic colitis ([facility]) 01/05/2020   Multiple lung nodules on CT 04/02/2015   Nicotine addiction    Obesity    Oxygen deficiency    qhs   Schizophrenia [facility])    Past Surgical History:  Procedure Laterality Date   CATARACT EXTRACTION W/PHACO Left 11/20/2013   Procedure: CATARACT EXTRACTION PHACO AND INTRAOCULAR LENS PLACEMENT ([place]);  Surgeon: Tonny Branch, MD;  Location: [facility];  Service: Ophthalmology;  Laterality: Left;  CDE:10.26   CATARACT EXTRACTION W/PHACO Right 12/08/2013   Procedure: RIGHT EYE CATARACT EXTRACTION PHACO AND INTRAOCULAR LENS PLACEMENT ;  Surgeon: Tonny Branch, MD;  Location: [facility];  Service: Ophthalmology;  Laterality: Right;  CDE 12.38   COLONOSCOPY N/A 04/01/2013   pancolonic diverticulosis, redundant colon, large internal hemorrhoids.    COLONOSCOPY  2014   INCOMPLETE PREP IN R COLON   COLONOSCOPY N/A 04/01/2020  Procedure: COLONOSCOPY;  Surgeon: Danie Binder, MD;  Location: [facility];  Service: Endoscopy;  Laterality: N/A;  8:45am   ESOPHAGOGASTRODUODENOSCOPY N/A 02/18/2016   stricture at GE junction, moderate erosive gastritis and mild non-erosive duodenitis. +H.pylori. Treated initially with Prevpac. Breath test to check for eradication was positive, and he was prescribed  Pylera.    EYE SURGERY Left 11/2013   cataract extraction   POLYPECTOMY  04/01/2020   Procedure: POLYPECTOMY;  Surgeon: Danie Binder, MD;  Location: [facility];  Service: Endoscopy;;   SAVORY DILATION N/A 02/18/2016   Procedure: SAVORY DILATION;  Surgeon: Danie Binder, MD;  Location: [facility];  Service: Endoscopy;  Laterality: N/A;   SHOULDER SURGERY       SOCIAL HISTORY:  Social History   Socioeconomic History   Marital status: Divorced    Spouse name: Not on file   Number of children: 2   Years of education: Not on file   Highest education level: Not on file  Occupational History   Occupation: retired from [employer]   Smoking status: Every Day    Packs/day: 2.00    Years: 48.00    Total pack years: 96.00    Types: Cigarettes   Smokeless tobacco: Never  Vaping Use   Vaping Use: Never used  Substance and Sexual Activity   Alcohol use: Yes    Alcohol/week: 0.0 standard drinks of alcohol    Comment: "little bit of beer"   Drug use: No   Sexual activity: Not Currently    Birth control/protection: None  Other Topics Concern   Not on file  Social History Narrative   ** Merged History Encounter **       ** Merged History Encounter **       Social Determinants of Health   Financial Resource Strain: Low Risk  (11/08/2021)   Overall Financial Resource Strain (CARDIA)    Difficulty of Paying Living Expenses: Not hard at all  Food Insecurity: No Food Insecurity (11/08/2021)   Hunger Vital Sign    Worried About Running Out of Food in the Last Year: Never true    Ran Out of Food in the Last Year: Never true  Transportation Needs: No Transportation Needs (11/08/2021)   PRAPARE - Hydrologist (Medical): No    Lack of Transportation (Non-Medical): No  Physical Activity: Inactive (11/08/2021)   Exercise Vital Sign    Days of Exercise per Week: 0 days    Minutes of Exercise per Session: 0 min  Stress: No Stress Concern  Present (11/08/2021)   [place]    Feeling of Stress : Not at all  Social Connections: Moderately Integrated (11/08/2021)   Social Connection and Isolation Panel [NHANES]    Frequency of Communication with Friends and Family: Twice a week    Frequency of Social Gatherings with Friends and Family: Once a week    Attends Religious Services: 1 to 4 times per year    Active Member of [employer] or Organizations: Yes    Attends Archivist Meetings: 1 to 4 times per year    Marital Status: Divorced  Intimate Partner Violence: Not At Risk (11/08/2021)   Humiliation, Afraid, Rape, and Kick questionnaire    Fear of Current or Ex-Partner: No    Emotionally Abused: No    Physically Abused: No    Sexually Abused: No    FAMILY HISTORY:  Family History  Problem Relation Age of Onset   Diabetes Mother    Hypertension Mother    Stroke Mother    Diabetes Sister    Heart disease Sister    Kidney disease Father    Drug abuse Brother    Colon cancer Neg Hx    Colon polyps Neg Hx     CURRENT MEDICATIONS:  Outpatient Encounter Medications as of 10/18/2022  Medication Sig   ACCU-CHEK GUIDE test strip USE AS DIRECTED TO CHECK BLOOD SUGAR ONCE DAILY.   Accu-Chek Softclix Lancets lancets TEST BLOOD SUGAR ONCE DAILY AS DIRECTED.   acetaminophen (TYLENOL) 500 MG tablet Take 500-1,000 mg by mouth as needed for headache. Takes mostly for HA about 2 x/week.   albuterol (VENTOLIN HFA) 108 (90 Base) MCG/ACT inhaler INHALE 2 PUFFS INTO THE LUNGS EVERY 6 HOURS AS NEEDED FOR WHEEZING OR SHORTNESS OF BREATH.   aspirin 81 MG EC tablet Take 81 mg by mouth daily. Swallow whole.   busPIRone (BUSPAR) 7.5 MG tablet TAKE (1) TABLET BY MOUTH THREE TIMES A DAY.   Cholecalciferol (D-3-5) 125 MCG (5000 UT) capsule Take 5,000 Units by mouth daily.   docusate sodium (COLACE) 100 MG capsule Take 100 mg by mouth daily as needed for mild constipation.     FARXIGA 5 MG TABS tablet Take 5 mg by mouth every morning.   fenofibrate (TRICOR) 145 MG tablet TAKE 1 TABLET BY MOUTH DAILY. STOP TAKING EZETIMIBE.   glipiZIDE (GLUCOTROL XL) 10 MG 24 hr tablet TAKE (2) TABLETS BY MOUTH DAILY EVERY MORNING AT BREAKFAST.   glucose blood test strip Use as instructed   ipratropium-albuterol (DUONEB) 0.5-2.5 (3) MG/3ML SOLN INHALE 1 VIAL VIA NEBULIZER EVERY 4 TO 6 HOURS.   Lancets 30G MISC Once daily testing dx e11.9   meloxicam (MOBIC) 7.5 MG tablet TAKE 1 TABLET BY MOUTH ONCE DAILY.   montelukast (SINGULAIR) 10 MG tablet TAKE (1) TABLET BY MOUTH AT BEDTIME.   olmesartan (BENICAR) 40 MG tablet TAKE ONE TABLET BY MOUTH DAILY   pantoprazole (PROTONIX) 40 MG tablet TAKE ONE TABLET BY MOUTH ONCE DAILY.   risperiDONE (RISPERDAL) 1 MG tablet TAKE ONE TABLET BY MOUTH IN THE EVENING. TAKE WITH 4MG TABLET.   risperidone (RISPERDAL) 4 MG tablet TAKE ONE TABLET BY MOUTH IN THE EVENING. TAKE WITH 1MG TABLET.   rosuvastatin (CRESTOR) 10 MG tablet Take 1 tablet (10 mg total) by mouth daily.   sucralfate (CARAFATE) 1 g tablet TAKE 1 TABLET BY MOUTH FOUR TIMES A DAY AS NEEDED.   SYMBICORT 160-4.5 MCG/ACT inhaler INHALE 2 PUFFS INTO THE LUNGS ONCE IN THE MORNING AND AT BEDTIME.   tamsulosin (FLOMAX) 0.4 MG CAPS capsule Take 1 capsule (0.4 mg total) by mouth daily after supper.   traZODone (DESYREL) 50 MG tablet TAKE ONE TABLET BY MOUTH AT BEDTIME AS NEEDED FOR SLEEP.   triamterene-hydrochlorothiazide (MAXZIDE-25) 37.5-25 MG tablet TAKE (1/2) TABLET BY MOUTH ONCE DAILY.   vitamin B-12 (CYANOCOBALAMIN) 1000 MCG tablet Take 1,000 mcg by mouth daily.   [DISCONTINUED] sildenafil (VIAGRA) 100 MG tablet Take 100 mg by mouth. Take one tablet 30 mins before intercourse    No facility-administered encounter medications on file as of 10/18/2022.    ALLERGIES:  Allergies  Allergen Reactions   Sertraline Hcl     Stomach upset/pain   Wellbutrin [Bupropion] Other (See Comments)    Makes  stomach hurt     PHYSICAL EXAM:  ECOG PERFORMANCE STATUS: 1 - Symptomatic but completely ambulatory  There were no vitals filed for this visit. There were no vitals filed for this visit. Physical Exam Constitutional:      Appearance: Normal appearance. He is obese.  HENT:     Head: Normocephalic and atraumatic.     Mouth/Throat:     Mouth: Mucous membranes are moist.  Eyes:     Extraocular Movements: Extraocular movements intact.     Pupils: Pupils are equal, round, and reactive to light.  Cardiovascular:     Rate and Rhythm: Normal rate and regular rhythm.     Pulses: Normal pulses.     Heart sounds: Normal heart sounds.  Pulmonary:     Effort: Pulmonary effort is normal.     Breath sounds: Normal breath sounds. Decreased air movement present.  Abdominal:     General: Bowel sounds are normal.     Palpations: Abdomen is soft.     Tenderness: There is no abdominal tenderness.  Musculoskeletal:        General: No swelling.     Right lower leg: No edema.     Left lower leg: No edema.  Lymphadenopathy:     Cervical: No cervical adenopathy.  Skin:    General: Skin is warm and dry.  Neurological:     General: No focal deficit present.     Mental Status: He is alert and oriented to person, place, and time.  Psychiatric:        Mood and Affect: Mood normal.        Behavior: Behavior normal.      LABORATORY DATA:  I have reviewed the labs as listed.  CBC    Component Value Date/Time   WBC 6.4 09/25/2022 1136   RBC 4.33 09/25/2022 1136   HGB 12.9 (L) 09/25/2022 1136   HGB 13.9 06/23/2021 1058   HCT 39.5 09/25/2022 1136   HCT 41.7 06/23/2021 1058   PLT 241 09/25/2022 1136   PLT 194 06/23/2021 1058   MCV 91.2 09/25/2022 1136   MCV 88 06/23/2021 1058   MCH 29.8 09/25/2022 1136   MCHC 32.7 09/25/2022 1136   RDW 14.5 09/25/2022 1136   RDW 12.7 06/23/2021 1058   LYMPHSABS 2.0 09/25/2022 1136   LYMPHSABS 2.1 06/23/2021 1058   MONOABS 0.6 09/25/2022 1136   EOSABS  0.2 09/25/2022 1136   EOSABS 0.4 06/23/2021 1058   BASOSABS 0.0 09/25/2022 1136   BASOSABS 0.0 06/23/2021 1058      Latest Ref Rng & Units 09/25/2022   11:36 AM 09/11/2022   12:03 PM 04/26/2022    8:21 AM  CMP  Glucose 70 - 99 mg/dL 96  114  109   BUN 8 - 23 mg/dL _0 Creatinine 0.61 - 1.24 mg/dL 1.69  1.90  1.74   Sodium 135 - 145 mmol/L 136  136  141   Potassium 3.5 - 5.1 mmol/L 3.7  3.8  4.0   Chloride 98 - 111 mmol/L 105  103  102   CO2 22 - 32 mmol/L _1 Calcium 8.9 - 10.3 mg/dL 10.5  10.6  10.9   Total Protein 6.5 - 8.1 g/dL 8.0  8.8  7.5   Total Bilirubin 0.3 - 1.2 mg/dL 0.5  0.5  0.5   Alkaline Phos 38 - 126 U/L 34  33  41   AST 15 - 41 U/L 30  32  30   ALT 0 - 44 U/L 40  37  34  DIAGNOSTIC IMAGING:  I have independently reviewed the relevant imaging and discussed with the patient.  ASSESSMENT & PLAN: 1.  IgG kappa MGUS - Labs sent by Dr. Theador Hawthorne from 07/04/2022 suggestive of possible monoclonal gammopathy as noted with urine immunofixation showing IgG kappa monoclonal protein, SPEP showing 1.0 g/dL spike in gamma globulin region.  Serum immunofixation confirms IgG kappa monoclonal protein.  Mildly elevated kappa free light chains 26.1 with normal ratio. - No new bone pain, neurologic changes, or B symptoms - Hematology work-up (09/25/2022): Immunofixation shows IgG monoclonal protein with kappa light chain specificity SPEP shows M spike 0.9% Free light chains: Mildly elevated kappa 30.5, normal, normal ratio 1.29 24-hour urine with UPEP/UIFE shows IgG kappa monoclonal protein, but is negative for urine M spike.  Total protein 103 mg/24 hrs (normal). Normal LDH.  Mildly elevated beta-2 microglobulin 2.7. - Skeletal survey (09/25/2022): No evidence of lytic or blastic bone lesions - Most recent CBC and CMP (09/25/2022) with Hgb 12.9, creatinine 1.69 (baseline CKD stage IIIb secondary to diabetes), and calcium 10.5 (see below) - PLAN: Repeat labs and  office visit in 6 months. - Annual skeletal survey next due October 2024.   2.   Mild hypercalcemia - CMP from 07/04/2022 shows borderline elevated calcium at 10.4 - Low urine calcium at 38 - He does not take any calcium supplement at home. - PLAN: Findings suggest hypocalciuric hypercalcemia.  Will defer monitoring and management to Dr. Theador Hawthorne.   3.  Tobacco abuse - Patient reports smoking 1-2 PPD cigarettes since age 76 - Most recent LDCT chest (03/02/2022) was lung RADS 2, benign appearance/behavior but with incidental finding of stable 4.3 cm diameter ascending thoracic aorta (recommend continued attention on follow-up of annual LCS) - PLAN: Patient has already been referred for LDCT scans via his PCP.  Continue annual CT scans for lung cancer screening, as patient will qualify until age 48.   17.  Other history - PMH: CKD stage IIIb secondary to diabetes, familial hypocalciuric hypercalcemia, chronic back pain, tobacco abuse, type 2 diabetes mellitus, COPD, hypertension, schizophrenia,  and obesity - SOCIAL: Lives at home alone.  Has caregiver 6 days of the week.  Used to work in [employer], but denies any major chemical exposures. - SUBSTANCE: Smokes 1-2 PPD cigarettes since age 36.  Remote history of drug use 50+ years ago, denies any IVDU.  Prior alcohol use (1 to 2 cans of beer daily), quit in 2023. - FAMILY: Negative for multiple myeloma, cancer, and blood disorders.   PLAN SUMMARY & DISPOSITION: Labs in 6 months Office visit 1 week after labs  All questions were answered. The patient knows to call the clinic with any problems, questions or concerns.  Medical decision making: Moderate  Time spent on visit: I spent 20 minutes counseling the patient face to face. The total time spent in the appointment was 30 minutes and more than 50% was on counseling.   Harriett Rush, PA-C  10/18/2022 10:45 AM

## 2022-10-18 ENCOUNTER — Other Ambulatory Visit: Payer: Self-pay | Admitting: Family Medicine

## 2022-10-18 ENCOUNTER — Inpatient Hospital Stay: Payer: Medicare Other | Attending: Hematology | Admitting: Physician Assistant

## 2022-10-18 VITALS — BP 109/73 | HR 89 | Temp 98.4°F | Resp 17 | Ht 73.0 in | Wt 226.2 lb

## 2022-10-18 DIAGNOSIS — J449 Chronic obstructive pulmonary disease, unspecified: Secondary | ICD-10-CM

## 2022-10-18 DIAGNOSIS — F209 Schizophrenia, unspecified: Secondary | ICD-10-CM

## 2022-10-18 DIAGNOSIS — D472 Monoclonal gammopathy: Secondary | ICD-10-CM | POA: Diagnosis not present

## 2022-10-18 DIAGNOSIS — E669 Obesity, unspecified: Secondary | ICD-10-CM | POA: Insufficient documentation

## 2022-10-18 DIAGNOSIS — N1832 Chronic kidney disease, stage 3b: Secondary | ICD-10-CM | POA: Insufficient documentation

## 2022-10-18 DIAGNOSIS — E1122 Type 2 diabetes mellitus with diabetic chronic kidney disease: Secondary | ICD-10-CM | POA: Diagnosis not present

## 2022-10-18 DIAGNOSIS — I129 Hypertensive chronic kidney disease with stage 1 through stage 4 chronic kidney disease, or unspecified chronic kidney disease: Secondary | ICD-10-CM | POA: Insufficient documentation

## 2022-10-18 DIAGNOSIS — M25551 Pain in right hip: Secondary | ICD-10-CM

## 2022-10-18 DIAGNOSIS — F17210 Nicotine dependence, cigarettes, uncomplicated: Secondary | ICD-10-CM | POA: Insufficient documentation

## 2022-10-18 NOTE — Patient Instructions (Signed)
[facility] **VISIT SUMMARY & IMPORTANT INSTRUCTIONS **   You were seen today by   PA-C.  MGUS (Monoclonal Gammopathy of Undetermined Significance) Your labs showed an abnormal protein in your blood.  This protein does not appear to be causing any problems at this time, but can progress to a type of cancer called multiple myeloma in about 1% of patients each year. You do not have any signs of multiple myeloma blood cancer at this time. We will check your labs again in 6 months to make sure that there have not been any major changes.    FOLLOW-UP APPOINTMENT: Office visit in 6 months, 1 week after labs  ** Thank you for trusting me with your healthcare!  I strive to provide all of my patients with quality care at each visit.  If you receive a survey for this visit, I would be so grateful to you for taking the time to provide feedback.  Thank you in advance!  ~                    Dr. Sreedhar Katragadda   &    , PA-C   - - - - - - - - - - - - - - - - - -    Thank you for choosing [facility] at [facility] to provide your oncology and hematology care.  To afford each patient quality time with our provider, please arrive at least 15 minutes before your scheduled appointment time.   If you have a lab appointment with the Cancer Center please come in thru the Main Entrance and check in at the main information desk.  You need to re-schedule your appointment should you arrive 10 or more minutes late.  We strive to give you quality time with our providers, and arriving late affects you and other patients whose appointments are after yours.  Also, if you no show three or more times for appointments you may be dismissed from the clinic at the providers discretion.     Again, thank you for choosing [facility].  Our hope is that these requests will decrease the amount of time that you wait  before being seen by our physicians.       _____________________________________________________________  Should you have questions after your visit to [facility], please contact our office at [phone] and follow the prompts.  Our office hours are 8:00 a.m. and 4:30 p.m. Monday - Friday.  Please note that voicemails left after 4:00 p.m. may not be returned until the following business day.  We are closed weekends and major holidays.  You do have access to a nurse 24-7, just call the main number to the clinic [phone] and do not press any options, hold on the line and a nurse will answer the phone.    For prescription refill requests, have your pharmacy contact our office and allow 72 hours.     

## 2022-10-20 ENCOUNTER — Ambulatory Visit: Payer: Medicare Other | Admitting: Podiatry

## 2022-10-25 DIAGNOSIS — N189 Chronic kidney disease, unspecified: Secondary | ICD-10-CM | POA: Diagnosis not present

## 2022-10-25 DIAGNOSIS — D472 Monoclonal gammopathy: Secondary | ICD-10-CM | POA: Diagnosis not present

## 2022-10-25 DIAGNOSIS — R809 Proteinuria, unspecified: Secondary | ICD-10-CM | POA: Diagnosis not present

## 2022-10-25 DIAGNOSIS — E1122 Type 2 diabetes mellitus with diabetic chronic kidney disease: Secondary | ICD-10-CM | POA: Diagnosis not present

## 2022-10-31 ENCOUNTER — Other Ambulatory Visit: Payer: Self-pay | Admitting: Family Medicine

## 2022-11-01 DIAGNOSIS — J449 Chronic obstructive pulmonary disease, unspecified: Secondary | ICD-10-CM | POA: Diagnosis not present

## 2022-11-10 ENCOUNTER — Other Ambulatory Visit: Payer: Self-pay | Admitting: Family Medicine

## 2022-11-16 ENCOUNTER — Other Ambulatory Visit: Payer: Self-pay | Admitting: Family Medicine

## 2022-11-16 DIAGNOSIS — J449 Chronic obstructive pulmonary disease, unspecified: Secondary | ICD-10-CM

## 2022-11-16 DIAGNOSIS — M25551 Pain in right hip: Secondary | ICD-10-CM

## 2022-11-22 ENCOUNTER — Other Ambulatory Visit: Payer: Self-pay | Admitting: Family Medicine

## 2022-11-22 ENCOUNTER — Encounter: Payer: Self-pay | Admitting: Podiatry

## 2022-11-22 ENCOUNTER — Ambulatory Visit (INDEPENDENT_AMBULATORY_CARE_PROVIDER_SITE_OTHER): Payer: Medicare Other | Admitting: Podiatry

## 2022-11-22 DIAGNOSIS — M2142 Flat foot [pes planus] (acquired), left foot: Secondary | ICD-10-CM | POA: Diagnosis not present

## 2022-11-22 DIAGNOSIS — Q828 Other specified congenital malformations of skin: Secondary | ICD-10-CM

## 2022-11-22 DIAGNOSIS — B351 Tinea unguium: Secondary | ICD-10-CM

## 2022-11-22 DIAGNOSIS — E1149 Type 2 diabetes mellitus with other diabetic neurological complication: Secondary | ICD-10-CM

## 2022-11-22 DIAGNOSIS — M79676 Pain in unspecified toe(s): Secondary | ICD-10-CM

## 2022-11-22 DIAGNOSIS — E1140 Type 2 diabetes mellitus with diabetic neuropathy, unspecified: Secondary | ICD-10-CM | POA: Diagnosis not present

## 2022-11-22 DIAGNOSIS — M2141 Flat foot [pes planus] (acquired), right foot: Secondary | ICD-10-CM

## 2022-11-22 NOTE — Progress Notes (Signed)
This patient returns to my office for at risk foot care.  This patient requires this care by a professional since this patient will be at risk due to having diabetes mellitus and chronic kidney disease.  Patient has pain in the two calluses on the bottom outside of his feet.  This patient is unable to cut nails himself since the patient cannot reach his nails.These nails are painful walking and wearing shoes. He cannot treat the painful calluses himself. . This patient presents for at risk foot care today.  General Appearance  Alert, conversant and in no acute stress.  Vascular  Dorsalis pedis and posterior tibial  pulses are palpable  bilaterally.  Capillary return is within normal limits  bilaterally. Temperature is within normal limits  bilaterally.  Neurologic  Senn-Weinstein monofilament wire test diminished   bilaterally. Muscle power within normal limits bilaterally.  Nails Thick disfigured discolored nails with subungual debris  from hallux to fifth toes bilaterally. No evidence of bacterial infection or drainage bilaterally.  Orthopedic  No limitations of motion  feet .  No crepitus or effusions noted.  No bony pathology or digital deformities noted.  Skin  normotropic skin  noted bilaterally.  No signs of infections or ulcers noted.   Porokeratosis sub 5th metabase  B/L.  Onychomycosis  Pain in right toes  Pain in left toes  Porokeratosis  X 2.  Consent was obtained for treatment procedures.   Mechanical debridement of nails 1-5  bilaterally performed with a nail nipper.  Filed with dremel without incident. Debridement of porokeratosis  with # 15 blade and dremel tool.     Return office visit   3 months                  Told patient to return for periodic foot care and evaluation due to potential at risk complications.   Lux Meaders DPM  

## 2022-11-24 ENCOUNTER — Encounter: Payer: Self-pay | Admitting: Internal Medicine

## 2022-11-24 ENCOUNTER — Ambulatory Visit (INDEPENDENT_AMBULATORY_CARE_PROVIDER_SITE_OTHER): Payer: Medicare Other | Admitting: Internal Medicine

## 2022-11-24 DIAGNOSIS — J189 Pneumonia, unspecified organism: Secondary | ICD-10-CM | POA: Diagnosis not present

## 2022-11-24 NOTE — Progress Notes (Signed)
   Acute Telephone Visit  Virtual Visit via Telephone Note  I connected with [name] on 11/24/22 at  2:20 PM EST by telephone and verified that I am speaking with the correct person using two identifiers.  Location: Patient: [address], [place], [place], [place] [address] Provider: [phone] [address] [place],  [address]   I discussed the limitations, risks, security and privacy concerns of performing an evaluation and management service by telephone and the availability of in person appointments. I also discussed with the patient that there may be a patient responsible charge related to this service. The patient expressed understanding and agreed to proceed.   History of Present Illness:  Mr. Goswami has been evaluated today via acute telephone encounter for sinus congestion and a cough with brown sputum production.  He reports onset of symptoms approximately 2 days ago.  He currently denies fever/chills, throat irritation, and shortness of breath.  His cough has been productive of brown sputum.  He was recently around someone with a cold, but reports that they did not have COVID-19 or influenza.  He has not been taking any medication for symptom relief.  Mr. Simmers is vaccinated against COVID-19 and influenza.  Assessment and Plan:  Community Acquired Pneumonia Evaluated today for a 2-day history of cough with brown sputum production, concerning for community-acquired pneumonia.  I have prescribed azithromycin x 5 days for treatment.  I have also recommended that he use a nasal saline rinse followed by fluticasone nasal spray for treatment of nasal congestion.  He can also take cetirizine and Sudafed as needed.  I have instructed him to return to care if his symptoms do not improve, specifically if he develops fever/chills or worsening shortness of breath.   Follow Up Instructions:    I discussed the assessment and treatment plan with the patient. The patient was provided an  opportunity to ask questions and all were answered. The patient agreed with the plan and demonstrated an understanding of the instructions.   The patient was advised to call back or seek an in-person evaluation if the symptoms worsen or if the condition fails to improve as anticipated.  I provided 6 minutes of non-face-to-face time during this encounter.   Johnette Abraham, MD

## 2022-11-27 ENCOUNTER — Ambulatory Visit (INDEPENDENT_AMBULATORY_CARE_PROVIDER_SITE_OTHER): Payer: Medicare Other

## 2022-11-27 ENCOUNTER — Other Ambulatory Visit: Payer: Self-pay | Admitting: Family Medicine

## 2022-11-27 DIAGNOSIS — Z0000 Encounter for general adult medical examination without abnormal findings: Secondary | ICD-10-CM

## 2022-11-27 NOTE — Progress Notes (Signed)
Subjective:   Matthew Molina is a 71 y.o. male who presents for Medicare Annual/Subsequent preventive examination.  Review of Systems    I connected with  Matthew Molina on 11/27/22 by a audio enabled telemedicine application and verified that I am speaking with the correct person using two identifiers.  Patient Location: Home  Provider Location: Office/Clinic  I discussed the limitations of evaluation and management by telemedicine. The patient expressed understanding and agreed to proceed.        Objective:    There were no vitals filed for this visit. There is no height or weight on file to calculate BMI.     10/18/2022   10:02 AM 09/25/2022    9:45 AM 09/11/2022   11:31 AM 11/08/2021   10:39 AM 09/03/2021    8:56 AM 07/27/2020    3:11 PM 04/01/2020    7:43 AM  Advanced Directives  Does Patient Have a Medical Advance Directive? _0  No No  Would patient like information on creating a medical advance directive? No - Patient declined No - Patient declined No - Patient declined No - Patient declined No - Patient declined Yes (ED - Information included in AVS) No - Patient declined    Current Medications (verified) Outpatient Encounter Medications as of 11/27/2022  Medication Sig   ACCU-CHEK GUIDE test strip USE AS DIRECTED TO CHECK BLOOD SUGAR ONCE DAILY.   Accu-Chek Softclix Lancets lancets TEST BLOOD SUGAR ONCE DAILY AS DIRECTED.   acetaminophen (TYLENOL) 500 MG tablet Take 500-1,000 mg by mouth as needed for headache. Takes mostly for HA about 2 x/week.   albuterol (VENTOLIN HFA) 108 (90 Base) MCG/ACT inhaler INHALE 2 PUFFS INTO THE LUNGS EVERY 6 HOURS AS NEEDED FOR WHEEZING OR SHORTNESS OF BREATH.   aspirin 81 MG EC tablet Take 81 mg by mouth daily. Swallow whole.   busPIRone (BUSPAR) 7.5 MG tablet TAKE (1) TABLET BY MOUTH THREE TIMES A DAY.   Cholecalciferol (D-3-5) 125 MCG (5000 UT) capsule Take 5,000 Units by mouth daily.   docusate sodium (COLACE) 100 MG  capsule Take 100 mg by mouth daily as needed for mild constipation.    FARXIGA 5 MG TABS tablet Take 5 mg by mouth every morning.   fenofibrate (TRICOR) 145 MG tablet TAKE 1 TABLET BY MOUTH DAILY. STOP TAKING EZETIMIBE.   glipiZIDE (GLUCOTROL XL) 10 MG 24 hr tablet TAKE (2) TABLETS BY MOUTH DAILY EVERY MORNING AT BREAKFAST.   glucose blood test strip Use as instructed   ipratropium-albuterol (DUONEB) 0.5-2.5 (3) MG/3ML SOLN INHALE 1 VIAL VIA NEBULIZER EVERY 4 TO 6 HOURS.   Lancets 30G MISC Once daily testing dx e11.9   meloxicam (MOBIC) 7.5 MG tablet TAKE 1 TABLET BY MOUTH ONCE DAILY.   montelukast (SINGULAIR) 10 MG tablet TAKE (1) TABLET BY MOUTH AT BEDTIME.   olmesartan (BENICAR) 40 MG tablet TAKE ONE TABLET BY MOUTH DAILY   pantoprazole (PROTONIX) 40 MG tablet TAKE ONE TABLET BY MOUTH ONCE DAILY.   risperiDONE (RISPERDAL) 1 MG tablet TAKE ONE TABLET BY MOUTH IN THE EVENING. TAKE WITH 4MG TABLET.   risperidone (RISPERDAL) 4 MG tablet TAKE ONE TABLET BY MOUTH IN THE EVENING. TAKE WITH 1MG TABLET.   rosuvastatin (CRESTOR) 10 MG tablet Take 1 tablet (10 mg total) by mouth daily.   sucralfate (CARAFATE) 1 g tablet TAKE 1 TABLET BY MOUTH FOUR TIMES A DAY AS NEEDED.   SYMBICORT 160-4.5 MCG/ACT inhaler INHALE 2 PUFFS INTO THE LUNGS  ONCE IN THE MORNING AND AT BEDTIME.   tamsulosin (FLOMAX) 0.4 MG CAPS capsule Take 1 capsule (0.4 mg total) by mouth daily after supper.   traZODone (DESYREL) 50 MG tablet TAKE ONE TABLET BY MOUTH AT BEDTIME AS NEEDED FOR SLEEP.   triamterene-hydrochlorothiazide (MAXZIDE-25) 37.5-25 MG tablet TAKE (1/2) TABLET BY MOUTH ONCE DAILY.   vitamin B-12 (CYANOCOBALAMIN) 1000 MCG tablet Take 1,000 mcg by mouth daily.   [DISCONTINUED] sildenafil (VIAGRA) 100 MG tablet Take 100 mg by mouth. Take one tablet 30 mins before intercourse    No facility-administered encounter medications on file as of 11/27/2022.    Allergies (verified) Sertraline hcl and Wellbutrin [bupropion]    History: Past Medical History:  Diagnosis Date   Acute blood loss anemia 01/05/2020   Acute kidney injury ([place])    Acute respiratory failure with hypoxia (H[facility])    Arthritis    COPD (chronic obstructive pulmonary disease) ([place])    Depression    Diabetes mellitus    Diabetes mellitus without complication ([place])    Diverticulitis    Elevated PSA 10/01/2016   Encounter for support and coordination of transition of care 01/14/2020   Hypercholesterolemia    Hyperlipidemia    Hypertension    Insomnia 01/05/2016   Ischemic colitis ([place]) 01/05/2020   Multiple lung nodules on CT 04/02/2015   Nicotine addiction    Obesity    Oxygen deficiency    qhs   Schizophrenia [facility])    Past Surgical History:  Procedure Laterality Date   CATARACT EXTRACTION W/PHACO Left 11/20/2013   Procedure: CATARACT EXTRACTION PHACO AND INTRAOCULAR LENS PLACEMENT ([place]);  Surgeon: Tonny Branch, MD;  Location: [facility];  Service: Ophthalmology;  Laterality: Left;  CDE:10.26   CATARACT EXTRACTION W/PHACO Right 12/08/2013   Procedure: RIGHT EYE CATARACT EXTRACTION PHACO AND INTRAOCULAR LENS PLACEMENT ;  Surgeon: Tonny Branch, MD;  Location: [facility];  Service: Ophthalmology;  Laterality: Right;  CDE 12.38   COLONOSCOPY N/A 04/01/2013   pancolonic diverticulosis, redundant colon, large internal hemorrhoids.    COLONOSCOPY  2014   INCOMPLETE PREP IN R COLON   COLONOSCOPY N/A 04/01/2020   Procedure: COLONOSCOPY;  Surgeon: Danie Binder, MD;  Location: [facility];  Service: Endoscopy;  Laterality: N/A;  8:45am   ESOPHAGOGASTRODUODENOSCOPY N/A 02/18/2016   stricture at GE junction, moderate erosive gastritis and mild non-erosive duodenitis. +H.pylori. Treated initially with Prevpac. Breath test to check for eradication was positive, and he was prescribed Pylera.    EYE SURGERY Left 11/2013   cataract extraction   POLYPECTOMY  04/01/2020   Procedure: POLYPECTOMY;  Surgeon: Danie Binder, MD;  Location: [facility];  Service:  Endoscopy;;   SAVORY DILATION N/A 02/18/2016   Procedure: SAVORY DILATION;  Surgeon: Danie Binder, MD;  Location: [facility];  Service: Endoscopy;  Laterality: N/A;   SHOULDER SURGERY     Family History  Problem Relation Age of Onset   Diabetes Mother    Hypertension Mother    Stroke Mother    Diabetes Sister    Heart disease Sister    Kidney disease Father    Drug abuse Brother    Colon cancer Neg Hx    Colon polyps Neg Hx    Social History   Socioeconomic History   Marital status: Divorced    Spouse name: Not on file   Number of children: 2   Years of education: Not on file   Highest education level: Not on file  Occupational History  Occupation: retired from [employer]   Tobacco Use   Smoking status: Every Day    Packs/day: 2.00    Years: 48.00    Total pack years: 96.00    Types: Cigarettes   Smokeless tobacco: Never  Vaping Use   Vaping Use: Never used  Substance and Sexual Activity   Alcohol use: Yes    Alcohol/week: 0.0 standard drinks of alcohol    Comment: "little bit of beer"   Drug use: No   Sexual activity: Not Currently    Birth control/protection: None  Other Topics Concern   Not on file  Social History Narrative   ** Merged History Encounter **       ** Merged History Encounter **       Social Determinants of Health   Financial Resource Strain: Low Risk  (11/08/2021)   Overall Financial Resource Strain (CARDIA)    Difficulty of Paying Living Expenses: Not hard at all  Food Insecurity: No Food Insecurity (11/08/2021)   Hunger Vital Sign    Worried About Running Out of Food in the Last Year: Never true    Ran Out of Food in the Last Year: Never true  Transportation Needs: No Transportation Needs (11/08/2021)   PRAPARE - Hydrologist (Medical): No    Lack of Transportation (Non-Medical): No  Physical Activity: Inactive (11/08/2021)   Exercise Vital Sign    Days of Exercise per Week: 0 days    Minutes of  Exercise per Session: 0 min  Stress: No Stress Concern Present (11/08/2021)   [place]    Feeling of Stress : Not at all  Social Connections: Moderately Integrated (11/08/2021)   Social Connection and Isolation Panel [NHANES]    Frequency of Communication with Friends and Family: Twice a week    Frequency of Social Gatherings with Friends and Family: Once a week    Attends Religious Services: 1 to 4 times per year    Active Member of [employer] or Organizations: Yes    Attends Archivist Meetings: 1 to 4 times per year    Marital Status: Divorced    Tobacco Counseling Ready to quit: Not Answered Counseling given: Not Answered   Clinical Intake:  Matthew Molina , Thank you for taking time to come for your Medicare Wellness Visit. I appreciate your ongoing commitment to your health goals. Please review the following plan we discussed and let me know if I can assist you in the future.   These are the goals we discussed:  Goals      Exercise 3x per week (30 min per time)     Recommend starting a routine exercise program at least 3 days a week for 30-45 minutes at a time as tolerated.       Patient Stated     Walking more.     Patient Stated     NONE.        This is a list of the screening recommended for you and due dates:  Health Maintenance  Topic Date Due   DTaP/Tdap/Td vaccine (2 - Tdap) 06/28/2014   COVID-19 Vaccine (5 - 2023-24 season) 08/18/2022   Yearly kidney health urinalysis for diabetes  09/15/2022   Hemoglobin A1C  02/09/2023   Screening for Lung Cancer  03/03/2023   Eye exam for diabetics  03/15/2023   Complete foot exam   09/20/2023   Yearly kidney function blood test for diabetes  09/26/2023   Medicare Annual Wellness Visit  11/28/2023   Colon Cancer Screening  04/01/2030   Pneumonia Vaccine  Completed   Flu Shot  Completed   Hepatitis C Screening: USPSTF Recommendation to screen -  Ages 18-79 yo.  Completed   Zoster (Shingles) Vaccine  Completed   HPV Vaccine  Aged Out                   Diabetic?yes Nutrition Risk Assessment:  Has the patient had any N/V/D within the last 2 months?  No  Does the patient have any non-healing wounds?  No  Has the patient had any unintentional weight loss or weight gain?  No   Diabetes:  Is the patient diabetic?  Yes  If diabetic, was a CBG obtained today?  No  Did the patient bring in their glucometer from home?  No  How often do you monitor your CBG's? DAILY.   Financial Strains and Diabetes Management:  Are you having any financial strains with the device, your supplies or your medication? No .  Does the patient want to be seen by Chronic Care Management for management of their diabetes?  No  Would the patient like to be referred to a Nutritionist or for Diabetic Management?  No   Diabetic Exams:  Diabetic Eye Exam: Completed 03/14/22 Diabetic Foot Exam: Completed 09/19/2022           Activities of Daily Living     No data to display           Patient Care Team: Fayrene Helper, MD as PCP - General (Family Medicine) Fay Records, MD as Consulting Physician (Cardiology) Fayrene Helper, MD Fayrene Helper, MD (Family Medicine) Gardiner Barefoot, DPM as Consulting Physician (Podiatry) Madelin Headings, DO (Optometry) Irine Seal, MD as Attending Physician (Urology) Eloise Harman, DO as Consulting Physician (Internal Medicine)  Indicate any recent Medical Services you may have received from other than [facility] providers in the past year (date may be approximate).     Assessment:   This is a routine wellness examination for Matthew Molina.  Hearing/Vision screen No results found.  Dietary issues and exercise activities discussed:     Goals Addressed   None   Depression Screen    09/19/2022    9:25 AM 08/09/2022    8:48 AM 05/02/2022   10:30 AM 03/22/2022   10:51 AM 02/15/2022    8:25 AM  11/08/2021   10:40 AM 11/08/2021   10:36 AM  PHQ 2/9 Scores  PHQ - 2 Score 0 0 0 0 0 0 0  PHQ- 9 Score      1 0    Fall Risk    09/19/2022    9:25 AM 08/09/2022    8:48 AM 05/02/2022   10:30 AM 03/22/2022   10:51 AM 02/15/2022    8:25 AM  Fall Risk   Falls in the past year? 0 0 0 0 0  Number falls in past yr: 0 0 0 0 0  Injury with Fall? 0 0 0 0 0  Risk for fall due to : _0   Follow up _1     FALL RISK PREVENTION PERTAINING TO THE HOME:  Any stairs in or around the home? Yes  If so, are there any without handrails? Yes  Home free of  loose throw rugs in walkways, pet beds, electrical cords, etc? Yes  Adequate lighting in your home to reduce risk of falls? Yes   ASSISTIVE DEVICES UTILIZED TO PREVENT FALLS:  Life alert? Yes  Use of a cane, walker or w/c? Yes  Grab bars in the bathroom? Yes  Shower chair or bench in shower? Yes  Elevated toilet seat or a handicapped toilet? Yes           07/27/2020    2:53 PM 07/15/2019    9:47 AM 07/08/2018   11:21 AM 05/09/2017   10:46 AM  6CIT Screen  What Year? 0 points 0 points 0 points 0 points  What month? 0 points 0 points 0 points 0 points  What time? 0 points 0 points 0 points 0 points  Count back from 20 0 points 0 points 0 points 0 points  Months in reverse 0 points 0 points 0 points 0 points  Repeat phrase 2 points 0 points 2 points 0 points  Total Score 2 points 0 points 2 points 0 points    Immunizations Immunization History  Administered Date(s) Administered   Fluad Quad(high Dose 65+) 10/08/2019, 09/13/2020, 09/15/2021, 09/19/2022   Influenza Split 09/02/2012   Influenza Whole 09/19/2007, 09/23/2009, 10/07/2010, 08/28/2011, 09/02/2015   Influenza, High Dose Seasonal PF 09/18/2018   Influenza,inj,Quad PF,6+ Mos 09/02/2013,  11/30/2014, 08/30/2015, 09/27/2016, 12/04/2017   Moderna Sars-Covid-2 Vaccination 02/12/2020, 03/23/2020, 10/22/2020, 08/05/2021   Pneumococcal Conjugate-13 06/16/2014   Pneumococcal Polysaccharide-23 06/28/2004, 08/27/2009, 04/17/2011, 05/04/2016   Td 06/28/2004   Zoster Recombinat (Shingrix) 06/04/2018, 01/21/2021   Zoster, Live 08/20/2014    TDAP status: Due, Education has been provided regarding the importance of this vaccine. Advised may receive this vaccine at local pharmacy or Health Dept. Aware to provide a copy of the vaccination record if obtained from local pharmacy or Health Dept. Verbalized acceptance and understanding.  Flu Vaccine status: Up to date  Pneumococcal vaccine status: Up to date  Covid-19 vaccine status: Completed vaccines  Qualifies for Shingles Vaccine? Yes   Zostavax completed Yes   Shingrix Completed?: Yes  Screening Tests Health Maintenance  Topic Date Due   DTaP/Tdap/Td (2 - Tdap) 06/28/2014   COVID-19 Vaccine (5 - 2023-24 season) 08/18/2022   Diabetic kidney evaluation - Urine ACR  09/15/2022   HEMOGLOBIN A1C  02/09/2023   Lung Cancer Screening  03/03/2023   OPHTHALMOLOGY EXAM  03/15/2023   FOOT EXAM  09/20/2023   Diabetic kidney evaluation - eGFR measurement  09/26/2023   Medicare Annual Wellness (AWV)  11/28/2023   COLONOSCOPY (Pts 45-92yr Insurance coverage will need to be confirmed)  04/01/2030   Pneumonia Vaccine 71 Years old  Completed   INFLUENZA VACCINE  Completed   Hepatitis C Screening  Completed   Zoster Vaccines- Shingrix  Completed   HPV VACCINES  Aged Out    Health Maintenance  Health Maintenance Due  Topic Date Due   DTaP/Tdap/Td (2 - Tdap) 06/28/2014   COVID-19 Vaccine (5 - 2023-24 season) 08/18/2022   Diabetic kidney evaluation - Urine ACR  09/15/2022    Colorectal cancer screening: Type of screening: Colonoscopy. Completed 04/01/20. Repeat every 10 years  Lung Cancer Screening: (Low Dose CT Chest recommended if  Age 72-80years, 30 pack-year currently smoking OR have quit w/in 15years.) does qualify.   Lung Cancer Screening Referral: Completed 03/02/22  Additional Screening:  Hepatitis C Screening: does qualify; Completed 07/08/21  Vision Screening: Recommended annual ophthalmology exams for early detection of glaucoma and other disorders  of the eye. Is the patient up to date with their annual eye exam?  Yes  Who is the provider or what is the name of the office in which the patient attends annual eye exams? N/A If pt is not established with a provider, would they like to be referred to a provider to establish care? No .   Dental Screening: Recommended annual dental exams for proper oral hygiene  Community Resource Referral / Chronic Care Management: CRR required this visit?  No   CCM required this visit?  No      Plan:     I have personally reviewed and noted the following in the patient's chart:   Medical and social history Use of alcohol, tobacco or illicit drugs  Current medications and supplements including opioid prescriptions. Patient is not currently taking opioid prescriptions. Functional ability and status Nutritional status Physical activity Advanced directives List of other physicians Hospitalizations, surgeries, and ER visits in previous 12 months Vitals Screenings to include cognitive, depression, and falls Referrals and appointments  In addition, I have reviewed and discussed with patient certain preventive protocols, quality metrics, and best practice recommendations. A written personalized care plan for preventive services as well as general preventive health recommendations were provided to patient.     Quentin Angst, [place]   11/27/2022

## 2022-11-27 NOTE — Patient Instructions (Signed)
[name] , Thank you for taking time to come for your Medicare Wellness Visit. I appreciate your ongoing commitment to your health goals. Please review the following plan we discussed and let me know if I can assist you in the future.     Preventive Care 42 Years and Older, Male Preventive care refers to lifestyle choices and visits with your health care provider that can promote health and wellness. What does preventive care include? A yearly physical exam. This is also called an annual well check. Dental exams once or twice a year. Routine eye exams. Ask your health care provider how often you should have your eyes checked. Personal lifestyle choices, including: Daily care of your teeth and gums. Regular physical activity. Eating a healthy diet. Avoiding tobacco and drug use. Limiting alcohol use. Practicing safe sex. Taking low doses of aspirin every day. Taking vitamin and mineral supplements as recommended by your health care provider. What happens during an annual well check? The services and screenings done by your health care provider during your annual well check will depend on your age, overall health, lifestyle risk factors, and family history of disease. Counseling  Your health care provider may ask you questions about your: Alcohol use. Tobacco use. Drug use. Emotional well-being. Home and relationship well-being. Sexual activity. Eating habits. History of falls. Memory and ability to understand (cognition). Work and work Statistician. Screening  You may have the following tests or measurements: Height, weight, and BMI. Blood pressure. Lipid and cholesterol levels. These may be checked every 5 years, or more frequently if you are over 55 years old. Skin check. Lung cancer screening. You may have this screening every year starting at age 76 if you have a 30-pack-year history of smoking and currently smoke or have quit within the past 15 years. Fecal occult blood  test (FOBT) of the stool. You may have this test every year starting at age 72. Flexible sigmoidoscopy or colonoscopy. You may have a sigmoidoscopy every 5 years or a colonoscopy every 10 years starting at age 72. Prostate cancer screening. Recommendations will vary depending on your family history and other risks. Hepatitis C blood test. Hepatitis B blood test. Sexually transmitted disease (STD) testing. Diabetes screening. This is done by checking your blood sugar (glucose) after you have not eaten for a while (fasting). You may have this done every 1-3 years. Abdominal aortic aneurysm (AAA) screening. You may need this if you are a current or former smoker. Osteoporosis. You may be screened starting at age 72 if you are at high risk. Talk with your health care provider about your test results, treatment options, and if necessary, the need for more tests. Vaccines  Your health care provider may recommend certain vaccines, such as: Influenza vaccine. This is recommended every year. Tetanus, diphtheria, and acellular pertussis (Tdap, Td) vaccine. You may need a Td booster every 10 years. Zoster vaccine. You may need this after age 72. Pneumococcal 13-valent conjugate (PCV13) vaccine. One dose is recommended after age 72. Pneumococcal polysaccharide (PPSV23) vaccine. One dose is recommended after age 72. Talk to your health care provider about which screenings and vaccines you need and how often you need them. This information is not intended to replace advice given to you by your health care provider. Make sure you discuss any questions you have with your health care provider. Document Released: 12/31/2015 Document Revised: 08/23/2016 Document Reviewed: 10/05/2015 Elsevier Interactive Patient Education  2017 [place] Prevention in the [facility]  can cause injuries. They can happen to people of all ages. There are many things you can do to make your home safe and to help prevent  falls. What can I do on the outside of my home? Regularly fix the edges of walkways and driveways and fix any cracks. Remove anything that might make you trip as you walk through a door, such as a raised step or threshold. Trim any bushes or trees on the path to your home. Use bright outdoor lighting. Clear any walking paths of anything that might make someone trip, such as rocks or tools. Regularly check to see if handrails are loose or broken. Make sure that both sides of any steps have handrails. Any raised decks and porches should have guardrails on the edges. Have any leaves, snow, or ice cleared regularly. Use sand or salt on walking paths during winter. Clean up any spills in your garage right away. This includes oil or grease spills. What can I do in the bathroom? Use night lights. Install grab bars by the toilet and in the tub and shower. Do not use towel bars as grab bars. Use non-skid mats or decals in the tub or shower. If you need to sit down in the shower, use a plastic, non-slip stool. Keep the floor dry. Clean up any water that spills on the floor as soon as it happens. Remove soap buildup in the tub or shower regularly. Attach bath mats securely with double-sided non-slip rug tape. Do not have throw rugs and other things on the floor that can make you trip. What can I do in the bedroom? Use night lights. Make sure that you have a light by your bed that is easy to reach. Do not use any sheets or blankets that are too big for your bed. They should not hang down onto the floor. Have a firm chair that has side arms. You can use this for support while you get dressed. Do not have throw rugs and other things on the floor that can make you trip. What can I do in the kitchen? Clean up any spills right away. Avoid walking on wet floors. Keep items that you use a lot in easy-to-reach places. If you need to reach something above you, use a strong step stool that has a grab  bar. Keep electrical cords out of the way. Do not use floor polish or wax that makes floors slippery. If you must use wax, use non-skid floor wax. Do not have throw rugs and other things on the floor that can make you trip. What can I do with my stairs? Do not leave any items on the stairs. Make sure that there are handrails on both sides of the stairs and use them. Fix handrails that are broken or loose. Make sure that handrails are as long as the stairways. Check any carpeting to make sure that it is firmly attached to the stairs. Fix any carpet that is loose or worn. Avoid having throw rugs at the top or bottom of the stairs. If you do have throw rugs, attach them to the floor with carpet tape. Make sure that you have a light switch at the top of the stairs and the bottom of the stairs. If you do not have them, ask someone to add them for you. What else can I do to help prevent falls? Wear shoes that: Do not have high heels. Have rubber bottoms. Are comfortable and fit you well. Are closed at the  toe. Do not wear sandals. If you use a stepladder: Make sure that it is fully opened. Do not climb a closed stepladder. Make sure that both sides of the stepladder are locked into place. Ask someone to hold it for you, if possible. Clearly mark and make sure that you can see: Any grab bars or handrails. First and last steps. Where the edge of each step is. Use tools that help you move around (mobility aids) if they are needed. These include: Canes. Walkers. Scooters. Crutches. Turn on the lights when you go into a dark area. Replace any light bulbs as soon as they burn out. Set up your furniture so you have a clear path. Avoid moving your furniture around. If any of your floors are uneven, fix them. If there are any pets around you, be aware of where they are. Review your medicines with your doctor. Some medicines can make you feel dizzy. This can increase your chance of falling. Ask  your doctor what other things that you can do to help prevent falls. This information is not intended to replace advice given to you by your health care provider. Make sure you discuss any questions you have with your health care provider. Document Released: 09/30/2009 Document Revised: 05/11/2016 Document Reviewed: 01/08/2015 Elsevier Interactive Patient Education  2017 [employer].

## 2022-11-29 DIAGNOSIS — R809 Proteinuria, unspecified: Secondary | ICD-10-CM | POA: Diagnosis not present

## 2022-11-29 DIAGNOSIS — N189 Chronic kidney disease, unspecified: Secondary | ICD-10-CM | POA: Diagnosis not present

## 2022-11-29 DIAGNOSIS — D638 Anemia in other chronic diseases classified elsewhere: Secondary | ICD-10-CM | POA: Diagnosis not present

## 2022-11-29 DIAGNOSIS — E1122 Type 2 diabetes mellitus with diabetic chronic kidney disease: Secondary | ICD-10-CM | POA: Diagnosis not present

## 2022-11-29 DIAGNOSIS — D472 Monoclonal gammopathy: Secondary | ICD-10-CM | POA: Diagnosis not present

## 2022-11-29 DIAGNOSIS — E1129 Type 2 diabetes mellitus with other diabetic kidney complication: Secondary | ICD-10-CM | POA: Diagnosis not present

## 2022-12-01 DIAGNOSIS — J449 Chronic obstructive pulmonary disease, unspecified: Secondary | ICD-10-CM | POA: Diagnosis not present

## 2022-12-04 ENCOUNTER — Telehealth: Payer: Self-pay | Admitting: Family Medicine

## 2022-12-04 ENCOUNTER — Other Ambulatory Visit: Payer: Self-pay

## 2022-12-04 ENCOUNTER — Other Ambulatory Visit: Payer: Self-pay | Admitting: Family Medicine

## 2022-12-04 MED ORDER — NICOTINE 14 MG/24HR TD PT24: 14.0000 mg | MEDICATED_PATCH | Freq: Every day | TRANSDERMAL | 1 refills | Status: DC

## 2022-12-04 NOTE — Telephone Encounter (Signed)
Patient called needs stop smoking patches called in pharmacy  Pharmacy: [facility]

## 2022-12-04 NOTE — Telephone Encounter (Signed)
Patient called back about about the patches, he would like a call when they are sent in [phone]

## 2022-12-04 NOTE — Telephone Encounter (Signed)
refilled 

## 2022-12-05 ENCOUNTER — Telehealth: Payer: Medicare Other | Admitting: Internal Medicine

## 2022-12-06 ENCOUNTER — Encounter (HOSPITAL_COMMUNITY): Payer: Self-pay | Admitting: *Deleted

## 2022-12-06 ENCOUNTER — Other Ambulatory Visit: Payer: Self-pay

## 2022-12-06 ENCOUNTER — Emergency Department (HOSPITAL_COMMUNITY): Payer: Medicare Other

## 2022-12-06 ENCOUNTER — Inpatient Hospital Stay (HOSPITAL_COMMUNITY)
Admission: EM | Admit: 2022-12-06 | Discharge: 2022-12-06 | DRG: 190 | Discharge status: Against Medical Advice | Payer: Medicare Other | Attending: Internal Medicine | Admitting: Internal Medicine

## 2022-12-06 DIAGNOSIS — R918 Other nonspecific abnormal finding of lung field: Secondary | ICD-10-CM | POA: Diagnosis present

## 2022-12-06 DIAGNOSIS — K219 Gastro-esophageal reflux disease without esophagitis: Secondary | ICD-10-CM | POA: Diagnosis not present

## 2022-12-06 DIAGNOSIS — Z716 Tobacco abuse counseling: Secondary | ICD-10-CM

## 2022-12-06 DIAGNOSIS — Z7984 Long term (current) use of oral hypoglycemic drugs: Secondary | ICD-10-CM | POA: Diagnosis not present

## 2022-12-06 DIAGNOSIS — Z8249 Family history of ischemic heart disease and other diseases of the circulatory system: Secondary | ICD-10-CM | POA: Diagnosis not present

## 2022-12-06 DIAGNOSIS — F419 Anxiety disorder, unspecified: Secondary | ICD-10-CM | POA: Diagnosis present

## 2022-12-06 DIAGNOSIS — Z6829 Body mass index (BMI) 29.0-29.9, adult: Secondary | ICD-10-CM

## 2022-12-06 DIAGNOSIS — I10 Essential (primary) hypertension: Secondary | ICD-10-CM | POA: Diagnosis present

## 2022-12-06 DIAGNOSIS — F32A Depression, unspecified: Secondary | ICD-10-CM | POA: Diagnosis not present

## 2022-12-06 DIAGNOSIS — Z1152 Encounter for screening for COVID-19: Secondary | ICD-10-CM | POA: Diagnosis not present

## 2022-12-06 DIAGNOSIS — E782 Mixed hyperlipidemia: Secondary | ICD-10-CM | POA: Diagnosis not present

## 2022-12-06 DIAGNOSIS — Z833 Family history of diabetes mellitus: Secondary | ICD-10-CM

## 2022-12-06 DIAGNOSIS — E669 Obesity, unspecified: Secondary | ICD-10-CM | POA: Diagnosis present

## 2022-12-06 DIAGNOSIS — Z961 Presence of intraocular lens: Secondary | ICD-10-CM | POA: Diagnosis present

## 2022-12-06 DIAGNOSIS — Z7952 Long term (current) use of systemic steroids: Secondary | ICD-10-CM | POA: Diagnosis not present

## 2022-12-06 DIAGNOSIS — J441 Chronic obstructive pulmonary disease with (acute) exacerbation: Secondary | ICD-10-CM | POA: Diagnosis not present

## 2022-12-06 DIAGNOSIS — J440 Chronic obstructive pulmonary disease with acute lower respiratory infection: Secondary | ICD-10-CM | POA: Diagnosis not present

## 2022-12-06 DIAGNOSIS — J9621 Acute and chronic respiratory failure with hypoxia: Secondary | ICD-10-CM | POA: Diagnosis not present

## 2022-12-06 DIAGNOSIS — J9601 Acute respiratory failure with hypoxia: Secondary | ICD-10-CM | POA: Diagnosis present

## 2022-12-06 DIAGNOSIS — E119 Type 2 diabetes mellitus without complications: Secondary | ICD-10-CM | POA: Diagnosis not present

## 2022-12-06 DIAGNOSIS — N400 Enlarged prostate without lower urinary tract symptoms: Secondary | ICD-10-CM | POA: Diagnosis present

## 2022-12-06 DIAGNOSIS — F209 Schizophrenia, unspecified: Secondary | ICD-10-CM | POA: Diagnosis present

## 2022-12-06 DIAGNOSIS — J189 Pneumonia, unspecified organism: Secondary | ICD-10-CM | POA: Diagnosis present

## 2022-12-06 DIAGNOSIS — R059 Cough, unspecified: Secondary | ICD-10-CM | POA: Diagnosis not present

## 2022-12-06 DIAGNOSIS — Z7982 Long term (current) use of aspirin: Secondary | ICD-10-CM | POA: Diagnosis not present

## 2022-12-06 DIAGNOSIS — Z79891 Long term (current) use of opiate analgesic: Secondary | ICD-10-CM

## 2022-12-06 DIAGNOSIS — F17210 Nicotine dependence, cigarettes, uncomplicated: Secondary | ICD-10-CM | POA: Diagnosis not present

## 2022-12-06 DIAGNOSIS — Z7951 Long term (current) use of inhaled steroids: Secondary | ICD-10-CM

## 2022-12-06 DIAGNOSIS — Z79899 Other long term (current) drug therapy: Secondary | ICD-10-CM

## 2022-12-06 LAB — CBC WITH DIFFERENTIAL/PLATELET
Abs Immature Granulocytes: 0.03 10*3/uL (ref 0.00–0.07)
Basophils Absolute: 0 10*3/uL (ref 0.0–0.1)
Basophils Relative: 0 %
Eosinophils Absolute: 0.1 10*3/uL (ref 0.0–0.5)
Eosinophils Relative: 2 %
HCT: 40.1 % (ref 39.0–52.0)
Hemoglobin: 13.1 g/dL (ref 13.0–17.0)
Immature Granulocytes: 0 %
Lymphocytes Relative: 6 %
Lymphs Abs: 0.4 10*3/uL — ABNORMAL LOW (ref 0.7–4.0)
MCH: 29.5 pg (ref 26.0–34.0)
MCHC: 32.7 g/dL (ref 30.0–36.0)
MCV: 90.3 fL (ref 80.0–100.0)
Monocytes Absolute: 0.7 10*3/uL (ref 0.1–1.0)
Monocytes Relative: 10 %
Neutro Abs: 5.8 10*3/uL (ref 1.7–7.7)
Neutrophils Relative %: 82 %
Platelets: 211 10*3/uL (ref 150–400)
RBC: 4.44 MIL/uL (ref 4.22–5.81)
RDW: 14.7 % (ref 11.5–15.5)
WBC: 7.2 10*3/uL (ref 4.0–10.5)
nRBC: 0 % (ref 0.0–0.2)

## 2022-12-06 LAB — URINALYSIS, ROUTINE W REFLEX MICROSCOPIC
Bacteria, UA: NONE SEEN
Bilirubin Urine: NEGATIVE
Glucose, UA: >=500 mg/dL — AB
Hgb urine dipstick: NEGATIVE
Ketones, ur: NEGATIVE mg/dL
Nitrite: POSITIVE — AB
Protein, ur: 30 mg/dL — AB
Specific Gravity, Urine: 1.017 (ref 1.005–1.030)
pH: 6 (ref 5.0–8.0)

## 2022-12-06 LAB — COMPREHENSIVE METABOLIC PANEL
ALT: 47 U/L — ABNORMAL HIGH (ref 0–44)
AST: 43 U/L — ABNORMAL HIGH (ref 15–41)
Albumin: 4.3 g/dL (ref 3.5–5.0)
Alkaline Phosphatase: 34 U/L — ABNORMAL LOW (ref 38–126)
Anion gap: 11 (ref 5–15)
BUN: 21 mg/dL (ref 8–23)
CO2: 21 mmol/L — ABNORMAL LOW (ref 22–32)
Calcium: 10.1 mg/dL (ref 8.9–10.3)
Chloride: 105 mmol/L (ref 98–111)
Creatinine, Ser: 1.69 mg/dL — ABNORMAL HIGH (ref 0.61–1.24)
GFR, Estimated: 43 mL/min — ABNORMAL LOW (ref >60)
Glucose, Bld: 96 mg/dL (ref 70–99)
Potassium: 4.1 mmol/L (ref 3.5–5.1)
Sodium: 137 mmol/L (ref 135–145)
Total Bilirubin: 0.7 mg/dL (ref 0.3–1.2)
Total Protein: 7.9 g/dL (ref 6.5–8.1)

## 2022-12-06 LAB — BLOOD GAS, VENOUS
Acid-Base Excess: 1.1 mmol/L (ref 0.0–2.0)
Bicarbonate: 25.1 mmol/L (ref 20.0–28.0)
Drawn by: 66582
O2 Saturation: 98.2 %
Patient temperature: 39.3
pCO2, Ven: 41 mmHg — ABNORMAL LOW (ref 44–60)
pH, Ven: 7.41 (ref 7.25–7.43)
pO2, Ven: 96 mmHg — ABNORMAL HIGH (ref 32–45)

## 2022-12-06 LAB — RESP PANEL BY RT-PCR (RSV, FLU A&B, COVID)  RVPGX2
Influenza A by PCR: NEGATIVE
Influenza B by PCR: NEGATIVE
Resp Syncytial Virus by PCR: NEGATIVE
SARS Coronavirus 2 by RT PCR: NEGATIVE

## 2022-12-06 LAB — LIPASE, BLOOD: Lipase: 32 U/L (ref 11–51)

## 2022-12-06 LAB — TROPONIN I (HIGH SENSITIVITY)
Troponin I (High Sensitivity): 6 ng/L (ref <18)
Troponin I (High Sensitivity): 6 ng/L (ref <18)

## 2022-12-06 MED ORDER — METHYLPREDNISOLONE SODIUM SUCC 125 MG IJ SOLR
125.0000 mg | Freq: Once | INTRAMUSCULAR | Status: AC
  Administered 2022-12-06: 125 mg via INTRAVENOUS
  Filled 2022-12-06: qty 2

## 2022-12-06 MED ORDER — MAGNESIUM SULFATE 2 GM/50ML IV SOLN
2.0000 g | Freq: Once | INTRAVENOUS | Status: AC
  Administered 2022-12-06: 2 g via INTRAVENOUS
  Filled 2022-12-06: qty 50

## 2022-12-06 MED ORDER — IPRATROPIUM-ALBUTEROL 0.5-2.5 (3) MG/3ML IN SOLN
3.0000 mL | Freq: Once | RESPIRATORY_TRACT | Status: AC
  Administered 2022-12-06: 3 mL via RESPIRATORY_TRACT
  Filled 2022-12-06: qty 3

## 2022-12-06 MED ORDER — ENOXAPARIN SODIUM 40 MG/0.4ML IJ SOSY
40.0000 mg | PREFILLED_SYRINGE | Freq: Every day | INTRAMUSCULAR | Status: DC
  Administered 2022-12-06: 40 mg via SUBCUTANEOUS
  Filled 2022-12-06: qty 0.4

## 2022-12-06 MED ORDER — ACETAMINOPHEN 500 MG PO TABS
1000.0000 mg | ORAL_TABLET | Freq: Once | ORAL | Status: AC
  Administered 2022-12-06: 1000 mg via ORAL
  Filled 2022-12-06: qty 2

## 2022-12-06 MED ORDER — CEFDINIR 300 MG PO CAPS: 300.0000 mg | ORAL_CAPSULE | Freq: Two times a day (BID) | ORAL | 0 refills | Status: DC

## 2022-12-06 MED ORDER — LACTATED RINGERS IV BOLUS
1000.0000 mL | Freq: Once | INTRAVENOUS | Status: AC
  Administered 2022-12-06: 1000 mL via INTRAVENOUS

## 2022-12-06 MED ORDER — SODIUM CHLORIDE 0.9 % IV SOLN
1.0000 g | Freq: Once | INTRAVENOUS | Status: AC
  Administered 2022-12-06: 1 g via INTRAVENOUS
  Filled 2022-12-06: qty 10

## 2022-12-06 MED ORDER — PREDNISONE 20 MG PO TABS: 40.0000 mg | ORAL_TABLET | Freq: Every day | ORAL | 0 refills | Status: DC

## 2022-12-06 MED ORDER — SODIUM CHLORIDE 0.9 % IV SOLN
500.0000 mg | INTRAVENOUS | Status: DC
  Administered 2022-12-06: 500 mg via INTRAVENOUS
  Filled 2022-12-06: qty 5

## 2022-12-06 NOTE — ED Provider Notes (Signed)
[facility] EMERGENCY DEPARTMENT Provider Note   CSN: [id number] Arrival date & time: 12/06/22  2725     History Chief Complaint  Patient presents with   Cough    HPI Matthew Molina is a 71 y.o. male presenting for chief complaint of shortness of breath.  He is 71 year old male with an extensive medical history.  He states that since last night has been struggling to breathe.  History of COPD,  psychiatric disease, hyperlipidemia, hypertension, chronic hypoxic respiratory failure.  He states that his breathing has acutely worsened this morning and he is tachypneic struggling to get through more than a few words at a time.   Patient's recorded medical, surgical, social, medication list and allergies were reviewed in the Snapshot window as part of the initial history.   Review of Systems   Review of Systems  Constitutional:  Negative for chills and fever.  HENT:  Negative for ear pain and sore throat.   Eyes:  Negative for pain and visual disturbance.  Respiratory:  Positive for cough and shortness of breath.   Cardiovascular:  Negative for chest pain and palpitations.  Gastrointestinal:  Negative for abdominal pain and vomiting.  Genitourinary:  Negative for dysuria and hematuria.  Musculoskeletal:  Negative for arthralgias and back pain.  Skin:  Negative for color change and rash.  Neurological:  Negative for seizures and syncope.  All other systems reviewed and are negative.   Physical Exam Updated Vital Signs BP 119/80   Pulse (!) 131   Temp (!) 102.7 F (39.3 C) (Rectal)   Resp (!) 28   Ht '6\' 1"'$  (1.854 m)   Wt 102.6 kg   SpO2 99%   BMI 29.84 kg/m  Physical Exam Vitals and nursing note reviewed.  Constitutional:      General: He is not in acute distress.    Appearance: He is well-developed.  HENT:     Head: Normocephalic and atraumatic.  Eyes:     Conjunctiva/sclera: Conjunctivae normal.  Cardiovascular:     Rate and Rhythm: Normal rate and regular  rhythm.     Heart sounds: No murmur heard. Pulmonary:     Effort: Respiratory distress present.     Breath sounds: Wheezing and rhonchi present.  Abdominal:     Palpations: Abdomen is soft.     Tenderness: There is no abdominal tenderness.  Musculoskeletal:        General: No swelling.     Cervical back: Neck supple.  Skin:    General: Skin is warm and dry.     Capillary Refill: Capillary refill takes less than 2 seconds.  Neurological:     Mental Status: He is alert.  Psychiatric:        Mood and Affect: Mood normal.      ED Course/ Medical Decision Making/ A&P Clinical Course as of 12/06/22 1522  Wed Dec 06, 2022  1522 Wants to be discharged.  [CC]    Clinical Course User Index [CC] Tretha Sciara, MD    Procedures .Critical Care  Performed by: Tretha Sciara, MD Authorized by: Tretha Sciara, MD   Critical care provider statement:    Critical care time (minutes):  30   Critical care was necessary to treat or prevent imminent or life-threatening deterioration of the following conditions:  Respiratory failure   Critical care was time spent personally by me on the following activities:  Development of treatment plan with patient or surrogate, discussions with consultants, evaluation of patient's response to treatment,  examination of patient, ordering and review of laboratory studies, ordering and review of radiographic studies, ordering and performing treatments and interventions, pulse oximetry, re-evaluation of patient's condition and review of old charts    Medications Ordered in ED Medications  azithromycin (ZITHROMAX) 500 mg in sodium chloride 0.9 % 250 mL IVPB (500 mg Intravenous New Bag/Given 12/06/22 1412)  enoxaparin (LOVENOX) injection 40 mg (40 mg Subcutaneous Given 12/06/22 1316)  lactated ringers bolus 1,000 mL (0 mLs Intravenous Stopped 12/06/22 1316)  acetaminophen (TYLENOL) tablet 1,000 mg (1,000 mg Oral Given 12/06/22 1056)  methylPREDNISolone  sodium succinate (SOLU-MEDROL) 125 mg/2 mL injection 125 mg (125 mg Intravenous Given 12/06/22 1052)  ipratropium-albuterol (DUONEB) 0.5-2.5 (3) MG/3ML nebulizer solution 3 mL (3 mLs Nebulization Given 12/06/22 1040)  magnesium sulfate IVPB 2 g 50 mL (0 g Intravenous Stopped 12/06/22 1316)  cefTRIAXone (ROCEPHIN) 1 g in sodium chloride 0.9 % 100 mL IVPB (1 g Intravenous New Bag/Given 12/06/22 1306)   Medical Decision Making:   Matthew Molina is a 71 y.o. male with a history of COPD , who presented to the ED today with acute on chronic SOB. They are endorsing worsening of their baseline dyspnea over the past 48 hours. Their baseline is no O2 requirement. At their baseline they are able to get around the neighborhood and they are not able to at this time.   On my initial exam, the pt was SOB and tachypneic. Audible wheezing and grossly decreased breath sounds appreciated.  They are endorsing increased sputum production.    Reviewed and confirmed nursing documentation for past medical history, family history, social history.    Initial Assessment:   With the patient's presentation of SOB in the above setting, most likely diagnosis is COPD Exacerbation. Other diagnoses were considered including (but not limited to) CAP, PE, ACS, viral infection, PTX. These are considered less likely due to history of present illness and physical exam findings.   This is most consistent with an acute life/limb threatening illness complicated by underlying chronic conditions.  Initial Plan:  Empiric treatment of patient's symptoms with immediate initiation of inhaled bronchodilators and IV steroids. Given advanced nature of patient's presentation, will proceed with IV magnesium as a rescue therapy.   Given extremis nature of the presentation, patient required non invasive ventilation initiation.   Evaluation for ACS with EKG and delta troponin  Evaluation for infectious versus intrathoracic abnormality with chest x-ray   Evaluation for volume overload with BNP  Screening labs including CBC and Metabolic panel to evaluate for infectious or metabolic etiology of disease.  Patient's Wells score is low and patient does not warrant further objective evaluation for PE based on consistency of presentation of alternative diagnosis.  Objective evaluation as below reviewed   Initial Study Results:   Laboratory  All laboratory results reviewed without evidence of clinically relevant pathology.   EKG EKG was reviewed independently. Rate, rhythm, axis, intervals all examined and without medically relevant abnormality. ST segments without concerns for elevations.    Radiology:  All images reviewed independently. Agree with radiology report at this time.   DG Chest 2 View  Result Date: 12/06/2022 CLINICAL DATA:  cough EXAM: CHEST - 2 VIEW COMPARISON:  01/05/2022 FINDINGS: Cardiac silhouette is unremarkable. No pneumothorax or pleural effusion. The lungs are clear. The visualized skeletal structures are unremarkable. IMPRESSION: No acute cardiopulmonary process. Electronically Signed   By: Sammie Bench M.D.   On: 12/06/2022 10:30      Consults: Case discussed with admitting  hospitalist.   Final Assessment and Plan:   After initiation of medical therapies, patient is grossly improved and no longer in acute distress.   Given the advanced nature of the patient's prenetation, patient will require advanced care and admission was arranged.      Addendum: I was called back to patient's bedside as they are requesting discharge.  He states that he does not want to stay in the hospital anymore.  He feels that he has been at the hospital too much. States that he feels better and would like to be discharged.  Hospitalist evaluated at bedside, I discussed at bedside.  I recommended continued admission.  Patient continued to refuse.  Patient is demonstrating capacity for medical decision and was able to express back understanding  of risk of death with leaving the hospital prior to completion of treatment. Patient discharged according to his wishes with strict return precautions  Clinical Impression:  1. Community acquired pneumonia of right lung, unspecified part of lung   2. COPD exacerbation ([place])      Admit Final Clinical Impression(s) / ED Diagnoses Final diagnoses:  Community acquired pneumonia of right lung, unspecified part of lung  COPD exacerbation ([place])    Rx / DC Orders ED Discharge Orders     None         Tretha Sciara, MD 12/06/22 1526

## 2022-12-06 NOTE — ED Triage Notes (Signed)
Pt c/o cough for the last couple of days

## 2022-12-06 NOTE — Progress Notes (Signed)
Patient keeps standing up and now in chair and constantly moving around and attempting to stand up making BIPAP mask not fit right on patient's face.  Removed it and paced on 4 L HFNC sat 98%.  Keeps saying he's hungry and I explained we have to make sure he wil tolerate being off BIPAP before he can eat.  RN aware patient is off.

## 2022-12-06 NOTE — ED Notes (Signed)
Date: 12/06/2022 Patient: Matthew Molina Admitted: 12/06/2022  9:26 AM Attending Provider: Barton Dubois, MD  Matthew Molina or his authorized caregiver has made the decision for the patient to leave the emergency department against the advice of Barton Dubois, MD.  He or his authorized caregiver has been informed and understands the inherent risks, including death.  He or his authorized caregiver has decided to accept the responsibility for this decision. Matthew Molina and all necessary parties have been advised that he may return for further evaluation or treatment. His condition at time of discharge was Serious.  Matthew Molina had current vital signs as follows:  Blood pressure 119/80, pulse (!) 131, temperature (!) 102.7 F (39.3 C), temperature source Rectal, resp. rate (!) 28, height '6\' 1"'$  (1.854 m), weight 102.6 kg, SpO2 99 %.   Matthew Molina or his authorized caregiver has signed the Leaving Against Medical Advice form prior to leaving the department.  Domingo Sep 12/06/2022

## 2022-12-06 NOTE — Consult Note (Signed)
Initial Consultation Note   Patient: Matthew Molina HQI:[id number] DOB: 07-07-1951 PCP: Fayrene Helper, MD DOA: 12/06/2022 DOS: the patient was seen and examined on 12/06/2022 Primary service: Barton Dubois, MD  Referring physician: Dr. Oswald Hillock Reason for consult: Admission secondary to acute respiratory failure with hypoxia, shortness of breath, coughing spells and fever.  Assessment/Plan: 1-acute respiratory failure with hypoxia in the setting of COPD exacerbation and most likely bronchiectasis -Patient demonstrating decreased air movement bilaterally and appreciated expiratory wheezing -Report productive coughing spells prior to admission -Positive fever -Patient recommended to be admitted for further evaluation and management of COPD exacerbation, planning to provide treatment with steroids, antibiotics, bronchodilator management, flutter valve and mucolytic's. -COVID PCR and influenza were negative. -While in the ED patient required transient use of BiPAP, which definitely help staphylolysin his symptoms and improving his breathing. -While discussing plan of care with patient he expressed that he was feeling ready to go home and that he would leave [place]. -Case discussed with emergency department physician in order to assist with the latest discharge on oral medication and instructions for return to the ED/outpatient follow-up with PCP.  2-hypertension -Continue home antihypertensive agents and heart healthy diet.  3-type 2 diabetes mellitus -Resume home hypoglycemic regimen -Modified carbohydrate diet discussed with patient.  4-history of BPH -Continue Flomax  5-history of depression/anxiety -Continue anxiolytics and antidepressant medication. -Continue treatment with Risperdal.  6-tobacco abuse -Cessation counseling provided -Patient encouraged to be compliant with the use of nicotine patches.  7-gastroesophageal flux disease -Continue PPI and  Carafate.  8-mixed hyperlipidemia -Continue the use of Crestor and Tricor.  HPI: Matthew Molina is a 71 y.o. male with past medical history of hypertension, gastroesophageal reflux disease, type 2 diabetes, mixed hyperlipidemia, COPD, tobacco abuse and depression/anxiety; who presented to the emergency department secondary to worsening shortness of breath, productive coughing spells (intermittent), and not feeling well.  Patient reports symptoms have been present for the last 4 days and worsening.  He denies hemoptysis, chest pain, nausea, vomiting, dysuria, hematuria, abdominal pain, focal weaknesses or any other complaints.  In the ED he was found to be febrile, bronchitic changes appreciated on chest x-ray and diffuse expiratory wheezing on physical examination.  Patient urinalysis also suggested the possibility of a UTI with positive nitrite and leukocyte esterase.    Gentle fluid resuscitation, antibiotics, steroids and nebulizer management were provided; due to increase work of breathing he was placed on BiPAP.  At time of my evaluation he felt significantly improved and removed BiPAP on his own.  He expressed that he was ready to go home and does not want to be admitted.  Demanded to be released [place].   Review of Systems: As mentioned in the history of present illness. All other systems reviewed and are negative. Past Medical History:  Diagnosis Date   Acute blood loss anemia 01/05/2020   Acute kidney injury ([place])    Acute respiratory failure with hypoxia (H[facility])    Arthritis    COPD (chronic obstructive pulmonary disease) ([place])    Depression    Diabetes mellitus    Diabetes mellitus without complication ([place])    Diverticulitis    Elevated PSA 10/01/2016   Encounter for support and coordination of transition of care 01/14/2020   Hypercholesterolemia    Hyperlipidemia    Hypertension    Insomnia 01/05/2016   Ischemic colitis ([place]) 01/05/2020   Multiple lung nodules on  CT 04/02/2015   Nicotine addiction    Obesity  Oxygen deficiency    qhs   Schizophrenia [facility])    Past Surgical History:  Procedure Laterality Date   CATARACT EXTRACTION W/PHACO Left 11/20/2013   Procedure: CATARACT EXTRACTION PHACO AND INTRAOCULAR LENS PLACEMENT (IOC);  Surgeon: Tonny Branch, MD;  Location: [facility];  Service: Ophthalmology;  Laterality: Left;  CDE:10.26   CATARACT EXTRACTION W/PHACO Right 12/08/2013   Procedure: RIGHT EYE CATARACT EXTRACTION PHACO AND INTRAOCULAR LENS PLACEMENT ;  Surgeon: Tonny Branch, MD;  Location: [facility];  Service: Ophthalmology;  Laterality: Right;  CDE 12.38   COLONOSCOPY N/A 04/01/2013   pancolonic diverticulosis, redundant colon, large internal hemorrhoids.    COLONOSCOPY  2014   INCOMPLETE PREP IN R COLON   COLONOSCOPY N/A 04/01/2020   Procedure: COLONOSCOPY;  Surgeon: Danie Binder, MD;  Location: [facility];  Service: Endoscopy;  Laterality: N/A;  8:45am   ESOPHAGOGASTRODUODENOSCOPY N/A 02/18/2016   stricture at GE junction, moderate erosive gastritis and mild non-erosive duodenitis. +H.pylori. Treated initially with Prevpac. Breath test to check for eradication was positive, and he was prescribed Pylera.    EYE SURGERY Left 11/2013   cataract extraction   POLYPECTOMY  04/01/2020   Procedure: POLYPECTOMY;  Surgeon: Danie Binder, MD;  Location: [facility];  Service: Endoscopy;;   SAVORY DILATION N/A 02/18/2016   Procedure: SAVORY DILATION;  Surgeon: Danie Binder, MD;  Location: [facility];  Service: Endoscopy;  Laterality: N/A;   SHOULDER SURGERY     Social History:  reports that he has been smoking cigarettes. He has a 96.00 pack-year smoking history. He has never used smokeless tobacco. He reports current alcohol use. He reports that he does not use drugs.  Allergies  Allergen Reactions   Sertraline Hcl     Stomach upset/pain   Wellbutrin [Bupropion] Other (See Comments)    Makes stomach hurt    Family History  Problem Relation  Age of Onset   Diabetes Mother    Hypertension Mother    Stroke Mother    Diabetes Sister    Heart disease Sister    Kidney disease Father    Drug abuse Brother    Colon cancer Neg Hx    Colon polyps Neg Hx     Prior to Admission medications   Medication Sig Start Date End Date Taking? Authorizing Provider  cefdinir (OMNICEF) 300 MG capsule Take 1 capsule (300 mg total) by mouth 2 (two) times daily for 5 days. 12/06/22 12/11/22 Yes Countryman, Cheri Rous, MD  predniSONE (DELTASONE) 20 MG tablet Take 2 tablets (40 mg total) by mouth daily. 12/06/22  Yes Tretha Sciara, MD  ACCU-CHEK GUIDE test strip USE AS DIRECTED TO CHECK BLOOD SUGAR ONCE DAILY. 11/18/21   Fayrene Helper, MD  Accu-Chek Softclix Lancets lancets TEST BLOOD SUGAR ONCE DAILY AS DIRECTED. 07/07/19   Fayrene Helper, MD  acetaminophen (TYLENOL) 500 MG tablet Take 500-1,000 mg by mouth as needed for headache. Takes mostly for HA about 2 x/week.    [provider]  albuterol (VENTOLIN HFA) 108 (90 Base) MCG/ACT inhaler INHALE 2 PUFFS INTO THE LUNGS EVERY 6 HOURS AS NEEDED FOR WHEEZING OR SHORTNESS OF BREATH. 11/16/22   Fayrene Helper, MD  aspirin 81 MG EC tablet Take 81 mg by mouth daily. Swallow whole.    [provider]  busPIRone (BUSPAR) 7.5 MG tablet TAKE (1) TABLET BY MOUTH THREE TIMES A DAY. 09/18/22   Fayrene Helper, MD  Cholecalciferol (D-3-5) 125 MCG (5000 UT) capsule Take  5,000 Units by mouth daily.    [provider]  docusate sodium (COLACE) 100 MG capsule Take 100 mg by mouth daily as needed for mild constipation.     [provider]  FARXIGA 5 MG TABS tablet Take 5 mg by mouth every morning. 07/27/22   [provider]  fenofibrate (TRICOR) 145 MG tablet TAKE 1 TABLET BY MOUTH DAILY. STOP TAKING EZETIMIBE. 11/14/22   Fayrene Helper, MD  glipiZIDE (GLUCOTROL XL) 10 MG 24 hr tablet TAKE (2) TABLETS BY MOUTH DAILY EVERY MORNING AT BREAKFAST. 11/16/22    Fayrene Helper, MD  glucose blood test strip Use as instructed 11/25/21   Paseda, Dewaine Conger, FNP  ipratropium-albuterol (DUONEB) 0.5-2.5 (3) MG/3ML SOLN INHALE 1 VIAL VIA NEBULIZER EVERY 4 TO 6 HOURS. 11/27/22   Fayrene Helper, MD  Lancets 30G MISC Once daily testing dx e11.9 11/25/21   Renee Rival, FNP  meloxicam (MOBIC) 7.5 MG tablet TAKE 1 TABLET BY MOUTH ONCE DAILY. 11/16/22   Fayrene Helper, MD  montelukast (SINGULAIR) 10 MG tablet TAKE (1) TABLET BY MOUTH AT BEDTIME. 12/04/22   Fayrene Helper, MD  nicotine (NICODERM CQ - DOSED IN MG/24 HOURS) 14 mg/24hr patch Place 1 patch (14 mg total) onto the skin daily. 12/04/22   Fayrene Helper, MD  olmesartan (BENICAR) 40 MG tablet TAKE ONE TABLET BY MOUTH DAILY 09/26/22   Fayrene Helper, MD  pantoprazole (PROTONIX) 40 MG tablet TAKE ONE TABLET BY MOUTH ONCE DAILY. 07/07/22   Sherron Monday, NP  risperiDONE (RISPERDAL) 1 MG tablet TAKE ONE TABLET BY MOUTH IN THE EVENING. TAKE WITH '4MG'$  TABLET. 09/12/22   Fayrene Helper, MD  risperidone (RISPERDAL) 4 MG tablet TAKE ONE TABLET BY MOUTH IN THE EVENING. TAKE WITH '1MG'$  TABLET. 10/18/22   Fayrene Helper, MD  rosuvastatin (CRESTOR) 10 MG tablet Take 1 tablet (10 mg total) by mouth daily. 02/03/22 09/19/22  Fay Records, MD  sucralfate (CARAFATE) 1 g tablet TAKE 1 TABLET BY MOUTH FOUR TIMES A DAY AS NEEDED. 07/14/21   Lindell Spar, MD  SYMBICORT 160-4.5 MCG/ACT inhaler INHALE 2 PUFFS INTO THE LUNGS ONCE IN THE MORNING AND AT BEDTIME. 11/16/22   Fayrene Helper, MD  tamsulosin (FLOMAX) 0.4 MG CAPS capsule Take 1 capsule (0.4 mg total) by mouth daily after supper. 06/26/22   McKenzie, Candee Furbish, MD  traZODone (DESYREL) 50 MG tablet TAKE ONE TABLET BY MOUTH AT BEDTIME AS NEEDED FOR SLEEP. 10/31/22   Fayrene Helper, MD  triamterene-hydrochlorothiazide (MAXZIDE-25) 37.5-25 MG tablet TAKE (1/2) TABLET BY MOUTH ONCE DAILY. 11/22/22   Fayrene Helper, MD  vitamin  B-12 (CYANOCOBALAMIN) 1000 MCG tablet Take 1,000 mcg by mouth daily.    [provider]  sildenafil (VIAGRA) 100 MG tablet Take 100 mg by mouth. Take one tablet 30 mins before intercourse   03/11/12  [provider]    Physical Exam: Vitals:   12/06/22 1400 12/06/22 1430 12/06/22 1438 12/06/22 1524  BP: 117/78 119/80    Pulse:  (!) 125 (!) 131   Resp:   (!) 28   Temp:    (!) 100.9 F (38.3 C)  TempSrc:    Oral  SpO2:  96% 99%   Weight:      Height:       General exam: Alert, awake, oriented x 3; no chest pain, no nausea or vomiting.  Afebrile after receiving antipyretics in the ED. Respiratory system: Decreased  air movement bilaterally, positive rhonchi and expiratory wheezing appreciated.  Demonstrating mild difficulty speaking in full sentences on exam. Cardiovascular system: Sinus tachycardia appreciated.. No rubs or gallops. Gastrointestinal system: Abdomen is nondistended, soft and nontender. No organomegaly or masses felt. Normal bowel sounds heard. Central nervous system: Alert and oriented. No focal neurological deficits. Extremities: No cyanosis or clubbing. Skin: No petechiae. Psychiatry: Judgement and insight appear normal. Mood & affect appropriate.   Data Reviewed:  Respiratory panel PCR for COVID and influenza negative CBC: WBC 7.2, hemoglobin 13.1 and platelet count 211 K Comprehensive metabolic panel: Sodium 160, potassium 4.1, BUN 21, creatinine 1.69, bicarb 21 Troponin negative x 2 Chest x-ray: Demonstrating chronic bronchitic changes without acute cardiopulmonary process.  There was no pneumothorax or pleural effusion on exam.  Family Communication: No family at bedside.  Primary team communication: EDP Dr. Tretha Sciara  Thank you very much for involving [place] in the care of your patient.  Author: Barton Dubois, MD 12/06/2022 7:30 PM  For on call review [email or web address].

## 2022-12-06 NOTE — ED Notes (Signed)
Blood venous gas not yet taken, waiting on lab to come draw it.

## 2022-12-08 ENCOUNTER — Emergency Department (HOSPITAL_COMMUNITY): Payer: Medicare Other

## 2022-12-08 ENCOUNTER — Other Ambulatory Visit: Payer: Self-pay

## 2022-12-08 ENCOUNTER — Encounter (HOSPITAL_COMMUNITY): Payer: Self-pay | Admitting: Emergency Medicine

## 2022-12-08 ENCOUNTER — Inpatient Hospital Stay (HOSPITAL_COMMUNITY)
Admission: EM | Admit: 2022-12-08 | Discharge: 2022-12-09 | DRG: 193 | Disposition: A | Discharge status: Home / Self Care | Payer: Medicare Other | Attending: Internal Medicine | Admitting: Internal Medicine

## 2022-12-08 DIAGNOSIS — J101 Influenza due to other identified influenza virus with other respiratory manifestations: Secondary | ICD-10-CM | POA: Diagnosis present

## 2022-12-08 DIAGNOSIS — J9601 Acute respiratory failure with hypoxia: Secondary | ICD-10-CM | POA: Diagnosis present

## 2022-12-08 DIAGNOSIS — R000 Tachycardia, unspecified: Secondary | ICD-10-CM | POA: Diagnosis not present

## 2022-12-08 DIAGNOSIS — N1832 Chronic kidney disease, stage 3b: Secondary | ICD-10-CM | POA: Diagnosis present

## 2022-12-08 DIAGNOSIS — R069 Unspecified abnormalities of breathing: Secondary | ICD-10-CM | POA: Diagnosis not present

## 2022-12-08 DIAGNOSIS — Z813 Family history of other psychoactive substance abuse and dependence: Secondary | ICD-10-CM

## 2022-12-08 DIAGNOSIS — E7800 Pure hypercholesterolemia, unspecified: Secondary | ICD-10-CM | POA: Diagnosis present

## 2022-12-08 DIAGNOSIS — E876 Hypokalemia: Secondary | ICD-10-CM | POA: Diagnosis present

## 2022-12-08 DIAGNOSIS — Z1152 Encounter for screening for COVID-19: Secondary | ICD-10-CM | POA: Diagnosis not present

## 2022-12-08 DIAGNOSIS — E1122 Type 2 diabetes mellitus with diabetic chronic kidney disease: Secondary | ICD-10-CM | POA: Diagnosis present

## 2022-12-08 DIAGNOSIS — F17210 Nicotine dependence, cigarettes, uncomplicated: Secondary | ICD-10-CM | POA: Diagnosis present

## 2022-12-08 DIAGNOSIS — Z8249 Family history of ischemic heart disease and other diseases of the circulatory system: Secondary | ICD-10-CM | POA: Diagnosis not present

## 2022-12-08 DIAGNOSIS — Z823 Family history of stroke: Secondary | ICD-10-CM | POA: Diagnosis not present

## 2022-12-08 DIAGNOSIS — Z833 Family history of diabetes mellitus: Secondary | ICD-10-CM

## 2022-12-08 DIAGNOSIS — E1169 Type 2 diabetes mellitus with other specified complication: Secondary | ICD-10-CM | POA: Diagnosis present

## 2022-12-08 DIAGNOSIS — R0602 Shortness of breath: Secondary | ICD-10-CM | POA: Diagnosis not present

## 2022-12-08 DIAGNOSIS — J441 Chronic obstructive pulmonary disease with (acute) exacerbation: Secondary | ICD-10-CM | POA: Diagnosis present

## 2022-12-08 DIAGNOSIS — I129 Hypertensive chronic kidney disease with stage 1 through stage 4 chronic kidney disease, or unspecified chronic kidney disease: Secondary | ICD-10-CM | POA: Diagnosis present

## 2022-12-08 DIAGNOSIS — Z7984 Long term (current) use of oral hypoglycemic drugs: Secondary | ICD-10-CM | POA: Diagnosis not present

## 2022-12-08 DIAGNOSIS — Z79899 Other long term (current) drug therapy: Secondary | ICD-10-CM

## 2022-12-08 DIAGNOSIS — E669 Obesity, unspecified: Secondary | ICD-10-CM | POA: Diagnosis present

## 2022-12-08 DIAGNOSIS — Z7951 Long term (current) use of inhaled steroids: Secondary | ICD-10-CM

## 2022-12-08 DIAGNOSIS — Z841 Family history of disorders of kidney and ureter: Secondary | ICD-10-CM

## 2022-12-08 DIAGNOSIS — Z720 Tobacco use: Secondary | ICD-10-CM | POA: Diagnosis present

## 2022-12-08 DIAGNOSIS — R0603 Acute respiratory distress: Secondary | ICD-10-CM | POA: Diagnosis not present

## 2022-12-08 DIAGNOSIS — R0902 Hypoxemia: Secondary | ICD-10-CM | POA: Diagnosis not present

## 2022-12-08 DIAGNOSIS — F209 Schizophrenia, unspecified: Secondary | ICD-10-CM | POA: Diagnosis present

## 2022-12-08 DIAGNOSIS — R062 Wheezing: Secondary | ICD-10-CM | POA: Diagnosis not present

## 2022-12-08 DIAGNOSIS — Z7982 Long term (current) use of aspirin: Secondary | ICD-10-CM | POA: Diagnosis not present

## 2022-12-08 DIAGNOSIS — E785 Hyperlipidemia, unspecified: Secondary | ICD-10-CM | POA: Diagnosis present

## 2022-12-08 DIAGNOSIS — I10 Essential (primary) hypertension: Secondary | ICD-10-CM | POA: Diagnosis present

## 2022-12-08 LAB — BASIC METABOLIC PANEL
Anion gap: 9 (ref 5–15)
BUN: 28 mg/dL — ABNORMAL HIGH (ref 8–23)
CO2: 25 mmol/L (ref 22–32)
Calcium: 10 mg/dL (ref 8.9–10.3)
Chloride: 103 mmol/L (ref 98–111)
Creatinine, Ser: 1.78 mg/dL — ABNORMAL HIGH (ref 0.61–1.24)
GFR, Estimated: 40 mL/min — ABNORMAL LOW (ref >60)
Glucose, Bld: 78 mg/dL (ref 70–99)
Potassium: 3.4 mmol/L — ABNORMAL LOW (ref 3.5–5.1)
Sodium: 137 mmol/L (ref 135–145)

## 2022-12-08 LAB — BLOOD GAS, VENOUS
Acid-Base Excess: 3.1 mmol/L — ABNORMAL HIGH (ref 0.0–2.0)
Bicarbonate: 29.5 mmol/L — ABNORMAL HIGH (ref 20.0–28.0)
Drawn by: 6378
O2 Saturation: 32.6 %
Patient temperature: 37
pCO2, Ven: 51 mmHg (ref 44–60)
pH, Ven: 7.37 (ref 7.25–7.43)
pO2, Ven: <31 mmHg — CL (ref 32–45)

## 2022-12-08 LAB — CBC WITH DIFFERENTIAL/PLATELET
Abs Immature Granulocytes: 0.03 10*3/uL (ref 0.00–0.07)
Basophils Absolute: 0 10*3/uL (ref 0.0–0.1)
Basophils Relative: 0 %
Eosinophils Absolute: 0 10*3/uL (ref 0.0–0.5)
Eosinophils Relative: 0 %
HCT: 41.3 % (ref 39.0–52.0)
Hemoglobin: 13.6 g/dL (ref 13.0–17.0)
Immature Granulocytes: 0 %
Lymphocytes Relative: 12 %
Lymphs Abs: 0.8 10*3/uL (ref 0.7–4.0)
MCH: 29.9 pg (ref 26.0–34.0)
MCHC: 32.9 g/dL (ref 30.0–36.0)
MCV: 90.8 fL (ref 80.0–100.0)
Monocytes Absolute: 1 10*3/uL (ref 0.1–1.0)
Monocytes Relative: 14 %
Neutro Abs: 4.9 10*3/uL (ref 1.7–7.7)
Neutrophils Relative %: 74 %
Platelets: 197 10*3/uL (ref 150–400)
RBC: 4.55 MIL/uL (ref 4.22–5.81)
RDW: 14.8 % (ref 11.5–15.5)
WBC: 6.8 10*3/uL (ref 4.0–10.5)
nRBC: 0 % (ref 0.0–0.2)

## 2022-12-08 LAB — TROPONIN I (HIGH SENSITIVITY)
Troponin I (High Sensitivity): 19 ng/L — ABNORMAL HIGH (ref <18)
Troponin I (High Sensitivity): 20 ng/L — ABNORMAL HIGH (ref <18)

## 2022-12-08 LAB — GLUCOSE, CAPILLARY
Glucose-Capillary: 165 mg/dL — ABNORMAL HIGH (ref 70–99)
Glucose-Capillary: 285 mg/dL — ABNORMAL HIGH (ref 70–99)
Glucose-Capillary: 218 mg/dL — ABNORMAL HIGH (ref 70–99)
Glucose-Capillary: 144 mg/dL — ABNORMAL HIGH (ref 70–99)

## 2022-12-08 LAB — RESP PANEL BY RT-PCR (RSV, FLU A&B, COVID)  RVPGX2
Influenza A by PCR: POSITIVE — AB
Influenza B by PCR: NEGATIVE
Resp Syncytial Virus by PCR: NEGATIVE
SARS Coronavirus 2 by RT PCR: NEGATIVE

## 2022-12-08 LAB — BRAIN NATRIURETIC PEPTIDE: B Natriuretic Peptide: 47 pg/mL (ref 0.0–100.0)

## 2022-12-08 MED ORDER — POTASSIUM CHLORIDE CRYS ER 20 MEQ PO TBCR
40.0000 meq | EXTENDED_RELEASE_TABLET | Freq: Once | ORAL | Status: AC
  Administered 2022-12-08: 40 meq via ORAL
  Filled 2022-12-08: qty 2

## 2022-12-08 MED ORDER — BUDESONIDE 0.25 MG/2ML IN SUSP
RESPIRATORY_TRACT | Status: AC
  Filled 2022-12-08: qty 2

## 2022-12-08 MED ORDER — ACETAMINOPHEN 325 MG PO TABS: 650.0000 mg | ORAL_TABLET | Freq: Four times a day (QID) | ORAL | Status: DC | PRN

## 2022-12-08 MED ORDER — BENZONATATE 100 MG PO CAPS
200.0000 mg | ORAL_CAPSULE | Freq: Three times a day (TID) | ORAL | Status: DC | PRN
  Administered 2022-12-08: 200 mg via ORAL
  Filled 2022-12-08: qty 2

## 2022-12-08 MED ORDER — ARFORMOTEROL TARTRATE 15 MCG/2ML IN NEBU
15.0000 ug | INHALATION_SOLUTION | Freq: Two times a day (BID) | RESPIRATORY_TRACT | Status: DC
  Administered 2022-12-08 – 2022-12-09 (×3): 15 ug via RESPIRATORY_TRACT
  Filled 2022-12-08 (×2): qty 2

## 2022-12-08 MED ORDER — SODIUM CHLORIDE 0.9 % IV SOLN
1.0000 g | Freq: Once | INTRAVENOUS | Status: AC
  Administered 2022-12-08: 1 g via INTRAVENOUS
  Filled 2022-12-08: qty 10

## 2022-12-08 MED ORDER — IPRATROPIUM-ALBUTEROL 0.5-2.5 (3) MG/3ML IN SOLN
3.0000 mL | Freq: Four times a day (QID) | RESPIRATORY_TRACT | Status: DC
  Administered 2022-12-08 – 2022-12-09 (×5): 3 mL via RESPIRATORY_TRACT
  Filled 2022-12-08 (×4): qty 3

## 2022-12-08 MED ORDER — ACETAMINOPHEN 325 MG PO TABS
650.0000 mg | ORAL_TABLET | Freq: Once | ORAL | Status: AC
  Administered 2022-12-08: 650 mg via ORAL
  Filled 2022-12-08: qty 2

## 2022-12-08 MED ORDER — INSULIN ASPART 100 UNIT/ML IJ SOLN
0.0000 [IU] | INTRAMUSCULAR | Status: DC
  Administered 2022-12-08: 2 [IU] via SUBCUTANEOUS
  Administered 2022-12-08: 8 [IU] via SUBCUTANEOUS
  Administered 2022-12-08: 5 [IU] via SUBCUTANEOUS
  Administered 2022-12-08: 3 [IU] via SUBCUTANEOUS
  Administered 2022-12-09: 2 [IU] via SUBCUTANEOUS

## 2022-12-08 MED ORDER — BUSPIRONE HCL 5 MG PO TABS
7.5000 mg | ORAL_TABLET | Freq: Three times a day (TID) | ORAL | Status: DC
  Administered 2022-12-08 – 2022-12-09 (×3): 7.5 mg via ORAL
  Filled 2022-12-08 (×3): qty 2

## 2022-12-08 MED ORDER — PANTOPRAZOLE SODIUM 40 MG PO TBEC
40.0000 mg | DELAYED_RELEASE_TABLET | Freq: Every day | ORAL | Status: DC
  Administered 2022-12-08 – 2022-12-09 (×2): 40 mg via ORAL
  Filled 2022-12-08 (×2): qty 1

## 2022-12-08 MED ORDER — IPRATROPIUM-ALBUTEROL 0.5-2.5 (3) MG/3ML IN SOLN
3.0000 mL | Freq: Once | RESPIRATORY_TRACT | Status: AC
  Administered 2022-12-08: 3 mL via RESPIRATORY_TRACT
  Filled 2022-12-08: qty 3

## 2022-12-08 MED ORDER — SODIUM CHLORIDE 0.9 % IV SOLN
1.0000 g | INTRAVENOUS | Status: DC
  Administered 2022-12-09: 1 g via INTRAVENOUS
  Filled 2022-12-08: qty 10

## 2022-12-08 MED ORDER — OSELTAMIVIR PHOSPHATE 30 MG PO CAPS
30.0000 mg | ORAL_CAPSULE | Freq: Two times a day (BID) | ORAL | Status: DC
  Administered 2022-12-08 – 2022-12-09 (×2): 30 mg via ORAL
  Filled 2022-12-08 (×2): qty 1

## 2022-12-08 MED ORDER — ACETAMINOPHEN 650 MG RE SUPP: 650.0000 mg | Freq: Four times a day (QID) | RECTAL | Status: DC | PRN

## 2022-12-08 MED ORDER — METHYLPREDNISOLONE SODIUM SUCC 125 MG IJ SOLR
62.5000 mg | Freq: Once | INTRAMUSCULAR | Status: AC
  Administered 2022-12-08: 62.5 mg via INTRAVENOUS
  Filled 2022-12-08: qty 2

## 2022-12-08 MED ORDER — ALBUTEROL SULFATE (2.5 MG/3ML) 0.083% IN NEBU: 2.5000 mg | INHALATION_SOLUTION | RESPIRATORY_TRACT | Status: DC | PRN

## 2022-12-08 MED ORDER — ENOXAPARIN SODIUM 40 MG/0.4ML IJ SOSY
40.0000 mg | PREFILLED_SYRINGE | Freq: Every day | INTRAMUSCULAR | Status: DC
  Administered 2022-12-08 – 2022-12-09 (×2): 40 mg via SUBCUTANEOUS
  Filled 2022-12-08 (×2): qty 0.4

## 2022-12-08 MED ORDER — HYDROCODONE BIT-HOMATROP MBR 5-1.5 MG/5ML PO SOLN
5.0000 mL | Freq: Four times a day (QID) | ORAL | Status: AC | PRN
  Administered 2022-12-08 – 2022-12-09 (×2): 5 mL via ORAL
  Filled 2022-12-08 (×2): qty 5

## 2022-12-08 MED ORDER — OSELTAMIVIR PHOSPHATE 75 MG PO CAPS
75.0000 mg | ORAL_CAPSULE | Freq: Two times a day (BID) | ORAL | Status: DC
  Administered 2022-12-08: 75 mg via ORAL
  Filled 2022-12-08 (×7): qty 1

## 2022-12-08 MED ORDER — IPRATROPIUM-ALBUTEROL 0.5-2.5 (3) MG/3ML IN SOLN
RESPIRATORY_TRACT | Status: AC
  Filled 2022-12-08: qty 3

## 2022-12-08 MED ORDER — NICOTINE 14 MG/24HR TD PT24
14.0000 mg | MEDICATED_PATCH | Freq: Once | TRANSDERMAL | Status: AC
  Administered 2022-12-08: 14 mg via TRANSDERMAL
  Filled 2022-12-08: qty 1

## 2022-12-08 MED ORDER — CHLORHEXIDINE GLUCONATE CLOTH 2 % EX PADS
6.0000 | MEDICATED_PAD | Freq: Every day | CUTANEOUS | Status: DC
  Administered 2022-12-08 – 2022-12-09 (×2): 6 via TOPICAL

## 2022-12-08 MED ORDER — ASPIRIN 81 MG PO TBEC
81.0000 mg | DELAYED_RELEASE_TABLET | Freq: Every day | ORAL | Status: DC
  Administered 2022-12-08 – 2022-12-09 (×2): 81 mg via ORAL
  Filled 2022-12-08 (×2): qty 1

## 2022-12-08 MED ORDER — TAMSULOSIN HCL 0.4 MG PO CAPS
0.4000 mg | ORAL_CAPSULE | Freq: Every day | ORAL | Status: DC
  Administered 2022-12-08: 0.4 mg via ORAL
  Filled 2022-12-08: qty 1

## 2022-12-08 MED ORDER — PREDNISONE 20 MG PO TABS: 40.0000 mg | ORAL_TABLET | Freq: Every day | ORAL | Status: DC

## 2022-12-08 MED ORDER — BUDESONIDE 0.25 MG/2ML IN SUSP
0.2500 mg | Freq: Two times a day (BID) | RESPIRATORY_TRACT | Status: DC
  Administered 2022-12-08 – 2022-12-09 (×3): 0.25 mg via RESPIRATORY_TRACT
  Filled 2022-12-08 (×2): qty 2

## 2022-12-08 MED ORDER — MONTELUKAST SODIUM 10 MG PO TABS
10.0000 mg | ORAL_TABLET | Freq: Every day | ORAL | Status: DC
  Administered 2022-12-08: 10 mg via ORAL
  Filled 2022-12-08: qty 1

## 2022-12-08 MED ORDER — RISPERIDONE 1 MG PO TABS
5.0000 mg | ORAL_TABLET | Freq: Every day | ORAL | Status: DC
  Administered 2022-12-08: 5 mg via ORAL
  Filled 2022-12-08: qty 5

## 2022-12-08 MED ORDER — LACTATED RINGERS IV BOLUS
1000.0000 mL | Freq: Once | INTRAVENOUS | Status: AC
  Administered 2022-12-08: 1000 mL via INTRAVENOUS

## 2022-12-08 MED ORDER — DM-GUAIFENESIN ER 30-600 MG PO TB12
1.0000 | ORAL_TABLET | Freq: Two times a day (BID) | ORAL | Status: DC
  Administered 2022-12-08 – 2022-12-09 (×3): 1 via ORAL
  Filled 2022-12-08 (×3): qty 1

## 2022-12-08 MED ORDER — ONDANSETRON HCL 4 MG/2ML IJ SOLN: 4.0000 mg | Freq: Four times a day (QID) | INTRAMUSCULAR | Status: DC | PRN

## 2022-12-08 MED ORDER — GUAIFENESIN ER 600 MG PO TB12
600.0000 mg | ORAL_TABLET | Freq: Two times a day (BID) | ORAL | Status: DC
  Administered 2022-12-08: 600 mg via ORAL
  Filled 2022-12-08: qty 1

## 2022-12-08 MED ORDER — METHYLPREDNISOLONE SODIUM SUCC 125 MG IJ SOLR
120.0000 mg | INTRAMUSCULAR | Status: AC
  Administered 2022-12-08: 120 mg via INTRAVENOUS
  Filled 2022-12-08: qty 2

## 2022-12-08 MED ORDER — ONDANSETRON HCL 4 MG PO TABS: 4.0000 mg | ORAL_TABLET | Freq: Four times a day (QID) | ORAL | Status: DC | PRN

## 2022-12-08 NOTE — ED Triage Notes (Signed)
Pt BIB [facility] from home c/o shortness of breath and cough. Pt seen yesterday for same and was to be admitted but he signed out [place].

## 2022-12-08 NOTE — H&P (Signed)
History and Physical    Matthew Molina MGQ:[id number] DOB: 07/19/1951 DOA: 12/08/2022  PCP: Fayrene Helper, MD  Patient coming from: Home  I have personally briefly reviewed patient's old medical records in [place]  Chief Complaint: Shortness of breath  HPI: Matthew Molina is a 71 y.o. male with medical history significant of COPD, tobacco use, diabetes, presents to the hospital with shortness of breath.  He actually initially presented to the hospital on 12/20 for COPD exacerbation and shortness of breath.  He briefly required BiPAP during his ER stay.  He was advised that he would need admission for further management of COPD, but he refused and left the hospital [place].  After returning home, his shortness of breath got progressively worse.  He reports a worsening cough and difficulty clearing sputum.  He also has some left-sided rib pain which is increased with coughing.  No nausea, vomiting.  He feels increasingly weak.  EMS was called out for shortness of breath and brought to the hospital.  He was noted to have increased work of breathing was placed on BiPAP.  He was also noted to be febrile.  Chest x-ray did not show any pneumonia, but he did test positive for influenza A.  He received steroids and bronchodilators.  Hospitalist service contacted for admission.   Review of Systems: As per HPI otherwise 10 point review of systems negative.    Past Medical History:  Diagnosis Date   Acute blood loss anemia 01/05/2020   Acute kidney injury ([place])    Acute respiratory failure with hypoxia (HCC)    Arthritis    COPD (chronic obstructive pulmonary disease) ([place])    Depression    Diabetes mellitus    Diabetes mellitus without complication ([place])    Diverticulitis    Elevated PSA 10/01/2016   Encounter for support and coordination of transition of care 01/14/2020   Hypercholesterolemia    Hyperlipidemia    Hypertension    Insomnia 01/05/2016   Ischemic  colitis ([place]) 01/05/2020   Multiple lung nodules on CT 04/02/2015   Nicotine addiction    Obesity    Oxygen deficiency    qhs   Schizophrenia [facility])     Past Surgical History:  Procedure Laterality Date   CATARACT EXTRACTION W/PHACO Left 11/20/2013   Procedure: CATARACT EXTRACTION PHACO AND INTRAOCULAR LENS PLACEMENT ([place]);  Surgeon: Tonny Branch, MD;  Location: [facility] ORS;  Service: Ophthalmology;  Laterality: Left;  CDE:10.26   CATARACT EXTRACTION W/PHACO Right 12/08/2013   Procedure: RIGHT EYE CATARACT EXTRACTION PHACO AND INTRAOCULAR LENS PLACEMENT ;  Surgeon: Tonny Branch, MD;  Location: [facility];  Service: Ophthalmology;  Laterality: Right;  CDE 12.38   COLONOSCOPY N/A 04/01/2013   pancolonic diverticulosis, redundant colon, large internal hemorrhoids.    COLONOSCOPY  2014   INCOMPLETE PREP IN R COLON   COLONOSCOPY N/A 04/01/2020   Procedure: COLONOSCOPY;  Surgeon: Danie Binder, MD;  Location: [facility] END[facility] SU[facility];  Service: Endoscopy;  Laterality: N/A;  8:45am   ESOPHAGOGASTRODUODENOSCOPY N/A 02/18/2016   stricture at GE junction, moderate erosive gastritis and mild non-erosive duodenitis. +H.pylori. Treated initially with Prevpac. Breath test to check for eradication was positive, and he was prescribed Pylera.    EYE SURGERY Left 11/2013   cataract extraction   POLYPECTOMY  04/01/2020   Procedure: POLYPECTOMY;  Surgeon: Danie Binder, MD;  Location: [facility];  Service: Endoscopy;;   SAVORY DILATION N/A 02/18/2016   Procedure: SAVORY DILATION;  Surgeon: Danie Binder, MD;  Location: [facility];  Service: Endoscopy;  Laterality: N/A;   SHOULDER SURGERY      Social History:  reports that he has been smoking cigarettes. He has a 96.00 pack-year smoking history. He has never used smokeless tobacco. He reports current alcohol use. He reports that he does not use drugs.  Allergies  Allergen Reactions   Sertraline Hcl     Stomach upset/pain   Wellbutrin [Bupropion] Other (See Comments)     Makes stomach hurt    Family History  Problem Relation Age of Onset   Diabetes Mother    Hypertension Mother    Stroke Mother    Diabetes Sister    Heart disease Sister    Kidney disease Father    Drug abuse Brother    Colon cancer Neg Hx    Colon polyps Neg Hx    Family history: Family history reviewed and not pertinent  Prior to Admission medications   Medication Sig Start Date End Date Taking? Authorizing Provider  ACCU-CHEK GUIDE test strip USE AS DIRECTED TO CHECK BLOOD SUGAR ONCE DAILY. 11/18/21   Fayrene Helper, MD  Accu-Chek Softclix Lancets lancets TEST BLOOD SUGAR ONCE DAILY AS DIRECTED. 07/07/19   Fayrene Helper, MD  acetaminophen (TYLENOL) 500 MG tablet Take 500-1,000 mg by mouth as needed for headache. Takes mostly for HA about 2 x/week.    [provider]  albuterol (VENTOLIN HFA) 108 (90 Base) MCG/ACT inhaler INHALE 2 PUFFS INTO THE LUNGS EVERY 6 HOURS AS NEEDED FOR WHEEZING OR SHORTNESS OF BREATH. 11/16/22   Fayrene Helper, MD  aspirin 81 MG EC tablet Take 81 mg by mouth daily. Swallow whole.    [provider]  busPIRone (BUSPAR) 7.5 MG tablet TAKE (1) TABLET BY MOUTH THREE TIMES A DAY. 09/18/22   Fayrene Helper, MD  cefdinir (OMNICEF) 300 MG capsule Take 1 capsule (300 mg total) by mouth 2 (two) times daily for 5 days. 12/06/22 12/11/22  Tretha Sciara, MD  Cholecalciferol (D-3-5) 125 MCG (5000 UT) capsule Take 5,000 Units by mouth daily.    [provider]  docusate sodium (COLACE) 100 MG capsule Take 100 mg by mouth daily as needed for mild constipation.     [provider]  FARXIGA 5 MG TABS tablet Take 5 mg by mouth every morning. 07/27/22   [provider]  fenofibrate (TRICOR) 145 MG tablet TAKE 1 TABLET BY MOUTH DAILY. STOP TAKING EZETIMIBE. 11/14/22   Fayrene Helper, MD  glipiZIDE (GLUCOTROL XL) 10 MG 24 hr tablet TAKE (2) TABLETS BY MOUTH DAILY EVERY MORNING AT BREAKFAST. 11/16/22   Fayrene Helper, MD  glucose blood test strip Use as instructed 11/25/21   Paseda, Dewaine Conger, FNP  ipratropium-albuterol (DUONEB) 0.5-2.5 (3) MG/3ML SOLN INHALE 1 VIAL VIA NEBULIZER EVERY 4 TO 6 HOURS. 11/27/22   Fayrene Helper, MD  Lancets 30G MISC Once daily testing dx e11.9 11/25/21   Renee Rival, FNP  meloxicam (MOBIC) 7.5 MG tablet TAKE 1 TABLET BY MOUTH ONCE DAILY. 11/16/22   Fayrene Helper, MD  montelukast (SINGULAIR) 10 MG tablet TAKE (1) TABLET BY MOUTH AT BEDTIME. 12/04/22   Fayrene Helper, MD  nicotine (NICODERM CQ - DOSED IN MG/24 HOURS) 14 mg/24hr patch Place 1 patch (14 mg total) onto the skin daily. 12/04/22   Fayrene Helper, MD  olmesartan (BENICAR) 40 MG tablet TAKE ONE TABLET BY MOUTH DAILY 09/26/22  Fayrene Helper, MD  pantoprazole (PROTONIX) 40 MG tablet TAKE ONE TABLET BY MOUTH ONCE DAILY. 07/07/22   Sherron Monday, NP  predniSONE (DELTASONE) 20 MG tablet Take 2 tablets (40 mg total) by mouth daily. 12/06/22   Tretha Sciara, MD  risperiDONE (RISPERDAL) 1 MG tablet TAKE ONE TABLET BY MOUTH IN THE EVENING. TAKE WITH '4MG'$  TABLET. 09/12/22   Fayrene Helper, MD  risperidone (RISPERDAL) 4 MG tablet TAKE ONE TABLET BY MOUTH IN THE EVENING. TAKE WITH '1MG'$  TABLET. 10/18/22   Fayrene Helper, MD  rosuvastatin (CRESTOR) 10 MG tablet Take 1 tablet (10 mg total) by mouth daily. 02/03/22 09/19/22  Fay Records, MD  sucralfate (CARAFATE) 1 g tablet TAKE 1 TABLET BY MOUTH FOUR TIMES A DAY AS NEEDED. 07/14/21   Lindell Spar, MD  SYMBICORT 160-4.5 MCG/ACT inhaler INHALE 2 PUFFS INTO THE LUNGS ONCE IN THE MORNING AND AT BEDTIME. 11/16/22   Fayrene Helper, MD  tamsulosin (FLOMAX) 0.4 MG CAPS capsule Take 1 capsule (0.4 mg total) by mouth daily after supper. 06/26/22   McKenzie, Candee Furbish, MD  traZODone (DESYREL) 50 MG tablet TAKE ONE TABLET BY MOUTH AT BEDTIME AS NEEDED FOR SLEEP. 10/31/22   Fayrene Helper, MD  triamterene-hydrochlorothiazide  (MAXZIDE-25) 37.5-25 MG tablet TAKE (1/2) TABLET BY MOUTH ONCE DAILY. 11/22/22   Fayrene Helper, MD  vitamin B-12 (CYANOCOBALAMIN) 1000 MCG tablet Take 1,000 mcg by mouth daily.    [provider]  sildenafil (VIAGRA) 100 MG tablet Take 100 mg by mouth. Take one tablet 30 mins before intercourse   03/11/12  [provider]    Physical Exam: Vitals:   12/08/22 0439 12/08/22 0500 12/08/22 0530 12/08/22 0634  BP:  (!) 146/86 (!) 141/88 130/87  Pulse:  (!) 123 (!) 125 (!) 123  Resp:  (!) 31 (!) 33 (!) 32  Temp:      TempSrc:      SpO2: 94% 94% 94% 97%  Weight:      Height:        Constitutional: NAD, calm, comfortable, somnolent but wakes up to voice and answers questions Eyes: PERRL, lids and conjunctivae normal ENMT: Exam limited due to BiPAP mask. Neck: normal, supple, no masses, no thyromegaly Respiratory: Fair air movement bilaterally.  No audible wheezes at this time.  Increased respiratory rate with BiPAP. Cardiovascular: Regular rate and rhythm, no murmurs / rubs / gallops. No extremity edema. 2+ pedal pulses. No carotid bruits.  Abdomen: no tenderness, no masses palpated. No hepatosplenomegaly. Bowel sounds positive.  Musculoskeletal: no clubbing / cyanosis. No joint deformity upper and lower extremities. Good ROM, no contractures. Normal muscle tone.  Skin: no rashes, lesions, ulcers. No induration Neurologic: CN 2-12 grossly intact. Sensation intact, DTR normal. Strength 5/5 in all 4.  Psychiatric: Unable to assess due to BiPAP mask   Labs on Admission: I have personally reviewed following labs and imaging studies  CBC: Recent Labs  Lab 12/06/22 1050 12/08/22 0340  WBC 7.2 6.8  NEUTROABS 5.8 4.9  HGB 13.1 13.6  HCT 40.1 41.3  MCV 90.3 90.8  PLT 211 706   Basic Metabolic Panel: Recent Labs  Lab 12/06/22 1050 12/08/22 0340  NA 137 137  K 4.1 3.4*  CL 105 103  CO2 21* 25  GLUCOSE 96 78  BUN 21 28*  CREATININE 1.69* 1.78*  CALCIUM 10.1  10.0   GFR: Estimated Creatinine Clearance: 47.2 mL/min (A) (by C-G formula based on SCr of 1.78  mg/dL (H)). Liver Function Tests: Recent Labs  Lab 12/06/22 1050  AST 43*  ALT 47*  ALKPHOS 34*  BILITOT 0.7  PROT 7.9  ALBUMIN 4.3   Recent Labs  Lab 12/06/22 1050  LIPASE 32   No results for input(s): "AMMONIA" in the last 168 hours. Coagulation Profile: No results for input(s): "INR", "PROTIME" in the last 168 hours. Cardiac Enzymes: No results for input(s): "CKTOTAL", "CKMB", "CKMBINDEX", "TROPONINI" in the last 168 hours. BNP (last 3 results) No results for input(s): "PROBNP" in the last 8760 hours. HbA1C: No results for input(s): "HGBA1C" in the last 72 hours. CBG: No results for input(s): "GLUCAP" in the last 168 hours. Lipid Profile: No results for input(s): "CHOL", "HDL", "LDLCALC", "TRIG", "CHOLHDL", "LDLDIRECT" in the last 72 hours. Thyroid Function Tests: No results for input(s): "TSH", "T4TOTAL", "FREET4", "T3FREE", "THYROIDAB" in the last 72 hours. Anemia Panel: No results for input(s): "VITAMINB12", "FOLATE", "FERRITIN", "TIBC", "IRON", "RETICCTPCT" in the last 72 hours. Urine analysis:    Component Value Date/Time   COLORURINE YELLOW 12/06/2022 [place] 12/06/2022 1134   APPEARANCEUR Clear 06/26/2022 1102   LABSPEC 1.017 12/06/2022 1134   PHURINE 6.0 12/06/2022 1134   GLUCOSEU >=500 (A) 12/06/2022 1134   HGBUR NEGATIVE 12/06/2022 1134   HGBUR negative 02/25/2009 1257   BILIRUBINUR NEGATIVE 12/06/2022 1134   BILIRUBINUR Negative 06/26/2022 1102   KETONESUR NEGATIVE 12/06/2022 1134   PROTEINUR 30 (A) 12/06/2022 1134   UROBILINOGEN 0.2 02/25/2009 1257   NITRITE POSITIVE (A) 12/06/2022 1134   LEUKOCYTESUR TRACE (A) 12/06/2022 1134    Radiological Exams on Admission: DG Chest 2 View  Result Date: 12/08/2022 CLINICAL DATA:  Shortness of breath, difficulty in breathing. EXAM: CHEST - 2 VIEW COMPARISON:  12/06/2022. FINDINGS: The heart  size and mediastinal contours are stable. Lung volumes are low. There is no consolidation, effusion, or pneumothorax. No acute osseous abnormality. IMPRESSION: No active cardiopulmonary disease. Electronically Signed   By: Brett Fairy M.D.   On: 12/08/2022 04:05   DG Chest 2 View  Result Date: 12/06/2022 CLINICAL DATA:  cough EXAM: CHEST - 2 VIEW COMPARISON:  01/05/2022 FINDINGS: Cardiac silhouette is unremarkable. No pneumothorax or pleural effusion. The lungs are clear. The visualized skeletal structures are unremarkable. IMPRESSION: No acute cardiopulmonary process. Electronically Signed   By: Sammie Bench M.D.   On: 12/06/2022 10:30   EKG: Independently reviewed. Sinus tachycardia  Assessment/Plan Active Problems:   Hyperlipidemia   Schizophrenia, unspecified type ([place])   Diabetes mellitus type 2 in obese (HCC)   Tobacco abuse   Hypertension   Chronic kidney disease, stage 3b (HCC)   Acute respiratory failure with hypoxia (HCC)   COPD exacerbation (HCC)   Influenza A     Acute respiratory failure with hypoxia -Unclear if patient is on home oxygen -Records indicate that he is on 2 to 3 L of oxygen at home but may not use it as prescribed.  When asked if he has oxygen at home, he denies. -Currently on BiPAP -Try to wean down oxygen as tolerated -Keep n.p.o. while on BiPAP  COPD exacerbation -Precipitated by influenza A -Continue on steroids, bronchodilators, inhaled steroids, Mucinex, pulmonary hygiene -Started on ceftriaxone for any bacterial component  Influenza A -Start course of Tamiflu  Diabetes mellitus type 2 -Holding home dose of Farxiga and glipizide -Check A1c -Started on sliding scale insulin -May need to start basal insulin based on blood sugars  Chronic kidney disease stage IIIb -Hemoglobin appears to be stable  at baseline -Holding further NSAIDs, ARB/HCTZ for now with elevated creatinine  Hypertension -Use hydralazine as needed  Tobacco  use -Counseled on tobacco cessation -Nicotine patch  Schizophrenia -Continue on Risperdal  Hyperlipidemia -Resume statin/Tricor once his respiratory status has stabilized  DVT prophylaxis: lovenox  Code Status: Full code Family Communication: Staff unable to reach daughter by phone, I did let patient's sister Miss Cheryl Flash know that he is being admitted Disposition Plan: Admit for further management of COPD/respiratory failure.  Likely discharge home once respiratory status has stabilized Consults called:   Admission status: Inpatient, stepdown  Kathie Dike MD [facility]   If 7PM-7AM, please contact night-coverage [email or web address]   12/08/2022, 8:04 AM

## 2022-12-08 NOTE — ED Notes (Signed)
Attempted to call daughter back x2 with pt update. Phone rang to voicemail and voicemail was full.

## 2022-12-08 NOTE — ED Notes (Signed)
RT states to place patient on N/C at @ 2LPM and take patient upstairs off Bi-pap.

## 2022-12-08 NOTE — ED Notes (Signed)
Date and time results received: 12/08/22 0453 (use smartphrase ".now" to insert current time)  Test: venous CO2 Critical Value: <31  Name of Provider Notified: Earl Lites, DO

## 2022-12-08 NOTE — Progress Notes (Signed)
Placed patient on Bipap due to WOB.  Settings 12/6, 30% with BUR of 12.

## 2022-12-08 NOTE — ED Provider Notes (Signed)
[facility] EMERGENCY DEPARTMENT Provider Note   CSN: [id number] Arrival date & time: 12/08/22  9379     History  Chief Complaint  Patient presents with   Shortness of Breath    Matthew Molina is a 71 y.o. male.  Patient as above with significant medical history as below, including schizophrenia, tobacco use, CKD, COPD on 2-3 LPM [place], HLD, HTN, DM who presents to the ED with complaint of dib.  Pt seen 12/20; dx w/ copd exacerbation and UTI; required BIPAP in the ED, was recommended for admission however he left AMA  Pt reports he initially felt better before leaving the ED but has been progressively getting weaker and having more dib.  Limited appetite, reduced po intake Cough has gotten worsen and having difficulty clearing sputum Worsening exertional dib Having some left sided rib pain he a/w increased coughing  no abd pain, no nausea or vomiting, no change to urination or bowel movements Ongoing fever/chills Still smoking 1-2 ppd      Past Medical History:  Diagnosis Date   Acute blood loss anemia 01/05/2020   Acute kidney injury ([place])    Acute respiratory failure with hypoxia (HCC)    Arthritis    COPD (chronic obstructive pulmonary disease) ([place])    Depression    Diabetes mellitus    Diabetes mellitus without complication ([place])    Diverticulitis    Elevated PSA 10/01/2016   Encounter for support and coordination of transition of care 01/14/2020   Hypercholesterolemia    Hyperlipidemia    Hypertension    Insomnia 01/05/2016   Ischemic colitis ([place]) 01/05/2020   Multiple lung nodules on CT 04/02/2015   Nicotine addiction    Obesity    Oxygen deficiency    qhs   Schizophrenia [facility])     Past Surgical History:  Procedure Laterality Date   CATARACT EXTRACTION W/PHACO Left 11/20/2013   Procedure: CATARACT EXTRACTION PHACO AND INTRAOCULAR LENS PLACEMENT ([place]);  Surgeon: Tonny Branch, MD;  Location: [facility] ORS;  Service: Ophthalmology;  Laterality: Left;  CDE:10.26    CATARACT EXTRACTION W/PHACO Right 12/08/2013   Procedure: RIGHT EYE CATARACT EXTRACTION PHACO AND INTRAOCULAR LENS PLACEMENT ;  Surgeon: Tonny Branch, MD;  Location: [facility] ORS;  Service: Ophthalmology;  Laterality: Right;  CDE 12.38   COLONOSCOPY N/A 04/01/2013   pancolonic diverticulosis, redundant colon, large internal hemorrhoids.    COLONOSCOPY  2014   INCOMPLETE PREP IN R COLON   COLONOSCOPY N/A 04/01/2020   Procedure: COLONOSCOPY;  Surgeon: Danie Binder, MD;  Location: [facility] ENDO SUITE;  Service: Endoscopy;  Laterality: N/A;  8:45am   ESOPHAGOGASTRODUODENOSCOPY N/A 02/18/2016   stricture at GE junction, moderate erosive gastritis and mild non-erosive duodenitis. +H.pylori. Treated initially with Prevpac. Breath test to check for eradication was positive, and he was prescribed Pylera.    EYE SURGERY Left 11/2013   cataract extraction   POLYPECTOMY  04/01/2020   Procedure: POLYPECTOMY;  Surgeon: Danie Binder, MD;  Location: [facility] ENDO SUITE;  Service: Endoscopy;;   SAVORY DILATION N/A 02/18/2016   Procedure: SAVORY DILATION;  Surgeon: Danie Binder, MD;  Location: [facility] ENDO SUITE;  Service: Endoscopy;  Laterality: N/A;   SHOULDER SURGERY       The history is provided by the patient. No language interpreter was used.  Shortness of Breath Associated symptoms: cough and fever   Associated symptoms: no abdominal pain, no chest pain, no headaches, no rash and no vomiting        Home  Medications Prior to Admission medications   Medication Sig Start Date End Date Taking? Authorizing Provider  ACCU-CHEK GUIDE test strip USE AS DIRECTED TO CHECK BLOOD SUGAR ONCE DAILY. 11/18/21   Fayrene Helper, MD  Accu-Chek Softclix Lancets lancets TEST BLOOD SUGAR ONCE DAILY AS DIRECTED. 07/07/19   Fayrene Helper, MD  acetaminophen (TYLENOL) 500 MG tablet Take 500-1,000 mg by mouth as needed for headache. Takes mostly for HA about 2 x/week.    [provider]  albuterol (VENTOLIN HFA) 108 (90 Base)  MCG/ACT inhaler INHALE 2 PUFFS INTO THE LUNGS EVERY 6 HOURS AS NEEDED FOR WHEEZING OR SHORTNESS OF BREATH. 11/16/22   Fayrene Helper, MD  aspirin 81 MG EC tablet Take 81 mg by mouth daily. Swallow whole.    [provider]  busPIRone (BUSPAR) 7.5 MG tablet TAKE (1) TABLET BY MOUTH THREE TIMES A DAY. 09/18/22   Fayrene Helper, MD  cefdinir (OMNICEF) 300 MG capsule Take 1 capsule (300 mg total) by mouth 2 (two) times daily for 5 days. 12/06/22 12/11/22  Tretha Sciara, MD  Cholecalciferol (D-3-5) 125 MCG (5000 UT) capsule Take 5,000 Units by mouth daily.    [provider]  docusate sodium (COLACE) 100 MG capsule Take 100 mg by mouth daily as needed for mild constipation.     [provider]  FARXIGA 5 MG TABS tablet Take 5 mg by mouth every morning. 07/27/22   [provider]  fenofibrate (TRICOR) 145 MG tablet TAKE 1 TABLET BY MOUTH DAILY. STOP TAKING EZETIMIBE. 11/14/22   Fayrene Helper, MD  glipiZIDE (GLUCOTROL XL) 10 MG 24 hr tablet TAKE (2) TABLETS BY MOUTH DAILY EVERY MORNING AT BREAKFAST. 11/16/22   Fayrene Helper, MD  glucose blood test strip Use as instructed 11/25/21   Paseda, Dewaine Conger, FNP  ipratropium-albuterol (DUONEB) 0.5-2.5 (3) MG/3ML SOLN INHALE 1 VIAL VIA NEBULIZER EVERY 4 TO 6 HOURS. 11/27/22   Fayrene Helper, MD  Lancets 30G MISC Once daily testing dx e11.9 11/25/21   Renee Rival, FNP  meloxicam (MOBIC) 7.5 MG tablet TAKE 1 TABLET BY MOUTH ONCE DAILY. 11/16/22   Fayrene Helper, MD  montelukast (SINGULAIR) 10 MG tablet TAKE (1) TABLET BY MOUTH AT BEDTIME. 12/04/22   Fayrene Helper, MD  nicotine (NICODERM CQ - DOSED IN MG/24 HOURS) 14 mg/24hr patch Place 1 patch (14 mg total) onto the skin daily. 12/04/22   Fayrene Helper, MD  olmesartan (BENICAR) 40 MG tablet TAKE ONE TABLET BY MOUTH DAILY 09/26/22   Fayrene Helper, MD  pantoprazole (PROTONIX) 40 MG tablet TAKE ONE TABLET BY MOUTH ONCE DAILY.  07/07/22   Sherron Monday, NP  predniSONE (DELTASONE) 20 MG tablet Take 2 tablets (40 mg total) by mouth daily. 12/06/22   Tretha Sciara, MD  risperiDONE (RISPERDAL) 1 MG tablet TAKE ONE TABLET BY MOUTH IN THE EVENING. TAKE WITH '4MG'$  TABLET. 09/12/22   Fayrene Helper, MD  risperidone (RISPERDAL) 4 MG tablet TAKE ONE TABLET BY MOUTH IN THE EVENING. TAKE WITH '1MG'$  TABLET. 10/18/22   Fayrene Helper, MD  rosuvastatin (CRESTOR) 10 MG tablet Take 1 tablet (10 mg total) by mouth daily. 02/03/22 09/19/22  Fay Records, MD  sucralfate (CARAFATE) 1 g tablet TAKE 1 TABLET BY MOUTH FOUR TIMES A DAY AS NEEDED. 07/14/21   Lindell Spar, MD  SYMBICORT 160-4.5 MCG/ACT inhaler INHALE 2 PUFFS INTO THE LUNGS ONCE IN THE MORNING AND AT BEDTIME. 11/16/22  Fayrene Helper, MD  tamsulosin (FLOMAX) 0.4 MG CAPS capsule Take 1 capsule (0.4 mg total) by mouth daily after supper. 06/26/22   McKenzie, Candee Furbish, MD  traZODone (DESYREL) 50 MG tablet TAKE ONE TABLET BY MOUTH AT BEDTIME AS NEEDED FOR SLEEP. 10/31/22   Fayrene Helper, MD  triamterene-hydrochlorothiazide (MAXZIDE-25) 37.5-25 MG tablet TAKE (1/2) TABLET BY MOUTH ONCE DAILY. 11/22/22   Fayrene Helper, MD  vitamin B-12 (CYANOCOBALAMIN) 1000 MCG tablet Take 1,000 mcg by mouth daily.    [provider]  sildenafil (VIAGRA) 100 MG tablet Take 100 mg by mouth. Take one tablet 30 mins before intercourse   03/11/12  [provider]      Allergies    Sertraline hcl and Wellbutrin [bupropion]    Review of Systems   Review of Systems  Constitutional:  Positive for appetite change, fatigue and fever. Negative for chills.  HENT:  Negative for facial swelling and trouble swallowing.   Eyes:  Negative for photophobia and visual disturbance.  Respiratory:  Positive for cough and shortness of breath.   Cardiovascular:  Negative for chest pain and palpitations.  Gastrointestinal:  Negative for abdominal pain, nausea and vomiting.   Endocrine: Negative for polydipsia and polyuria.  Genitourinary:  Negative for difficulty urinating and hematuria.  Musculoskeletal:  Positive for arthralgias. Negative for gait problem and joint swelling.  Skin:  Negative for pallor and rash.  Neurological:  Negative for syncope and headaches.  Psychiatric/Behavioral:  Negative for agitation and confusion.     Physical Exam Updated Vital Signs BP 130/87   Pulse (!) 123   Temp (!) 101 F (38.3 C) (Axillary)   Resp (!) 32   Ht '6\' 1"'$  (1.854 m)   Wt 102.6 kg   SpO2 97%   BMI 29.84 kg/m  Physical Exam Vitals and nursing note reviewed.  Constitutional:      General: He is not in acute distress.    Appearance: Normal appearance. He is well-developed. He is not ill-appearing or diaphoretic.  HENT:     Head: Normocephalic and atraumatic.     Right Ear: External ear normal.     Left Ear: External ear normal.     Mouth/Throat:     Mouth: Mucous membranes are moist.  Eyes:     General: No scleral icterus. Cardiovascular:     Rate and Rhythm: Regular rhythm. Tachycardia present.     Pulses: Normal pulses.     Heart sounds: Normal heart sounds.  Pulmonary:     Effort: Pulmonary effort is normal. Tachypnea present. No respiratory distress.     Breath sounds: No stridor. Decreased breath sounds and wheezing present.  Abdominal:     General: Abdomen is flat.     Palpations: Abdomen is soft.     Tenderness: There is no abdominal tenderness. There is no guarding or rebound.  Musculoskeletal:        General: Normal range of motion.     Cervical back: Normal range of motion.     Right lower leg: No edema.     Left lower leg: No edema.  Skin:    General: Skin is warm and dry.     Capillary Refill: Capillary refill takes less than 2 seconds.     Coloration: Skin is not jaundiced.  Neurological:     Mental Status: He is alert and oriented to person, place, and time.  Psychiatric:        Mood and Affect: Mood normal.  Behavior: Behavior normal.     ED Results / Procedures / Treatments   Labs (all labs ordered are listed, but only abnormal results are displayed) Labs Reviewed  BASIC METABOLIC PANEL - Abnormal; Notable for the following components:      Result Value   Potassium 3.4 (*)    BUN 28 (*)    Creatinine, Ser 1.78 (*)    GFR, Estimated 40 (*)    All other components within normal limits  BLOOD GAS, VENOUS - Abnormal; Notable for the following components:   pO2, Ven <31 (*)    Bicarbonate 29.5 (*)    Acid-Base Excess 3.1 (*)    All other components within normal limits  TROPONIN I (HIGH SENSITIVITY) - Abnormal; Notable for the following components:   Troponin I (High Sensitivity) 19 (*)    All other components within normal limits  RESP PANEL BY RT-PCR (RSV, FLU A&B, COVID)  RVPGX2  CBC WITH DIFFERENTIAL/PLATELET  BRAIN NATRIURETIC PEPTIDE  URINALYSIS, ROUTINE W REFLEX MICROSCOPIC  TROPONIN I (HIGH SENSITIVITY)    EKG EKG Interpretation  Date/Time:  Friday December 08 2022 03:41:40 EST Ventricular Rate:  125 PR Interval:    QRS Duration: 90 QT Interval:  298 QTC Calculation: 428 R Axis:   -77 Text Interpretation: Sinus tachycardia Left anterior fascicular block Consider anterior infarct Borderline ST depression, anterior leads Confirmed by Wynona Dove ([id number]) on 12/08/2022 4:14:47 AM  Radiology DG Chest 2 View  Result Date: 12/08/2022 CLINICAL DATA:  Shortness of breath, difficulty in breathing. EXAM: CHEST - 2 VIEW COMPARISON:  12/06/2022. FINDINGS: The heart size and mediastinal contours are stable. Lung volumes are low. There is no consolidation, effusion, or pneumothorax. No acute osseous abnormality. IMPRESSION: No active cardiopulmonary disease. Electronically Signed   By: Brett Fairy M.D.   On: 12/08/2022 04:05   DG Chest 2 View  Result Date: 12/06/2022 CLINICAL DATA:  cough EXAM: CHEST - 2 VIEW COMPARISON:  01/05/2022 FINDINGS: Cardiac silhouette is unremarkable.  No pneumothorax or pleural effusion. The lungs are clear. The visualized skeletal structures are unremarkable. IMPRESSION: No acute cardiopulmonary process. Electronically Signed   By: Sammie Bench M.D.   On: 12/06/2022 10:30    Procedures .Critical Care  Performed by: Jeanell Sparrow, DO Authorized by: Jeanell Sparrow, DO   Critical care provider statement:    Critical care time (minutes):  30   Critical care time was exclusive of:  Separately billable procedures and treating other patients   Critical care was necessary to treat or prevent imminent or life-threatening deterioration of the following conditions:  Respiratory failure   Critical care was time spent personally by me on the following activities:  Development of treatment plan with patient or surrogate, discussions with consultants, evaluation of patient's response to treatment, examination of patient, ordering and review of laboratory studies, ordering and review of radiographic studies, ordering and performing treatments and interventions, pulse oximetry, re-evaluation of patient's condition, review of old charts and obtaining history from patient or surrogate   Care discussed with: admitting provider       Medications Ordered in ED Medications  cefTRIAXone (ROCEPHIN) 1 g in sodium chloride 0.9 % 100 mL IVPB (1 g Intravenous New Bag/Given 12/08/22 0620)  nicotine (NICODERM CQ - dosed in mg/24 hours) patch 14 mg (has no administration in time range)  acetaminophen (TYLENOL) tablet 650 mg (650 mg Oral Given 12/08/22 0456)  lactated ringers bolus 1,000 mL (0 mLs Intravenous Stopped 12/08/22 0621)  ipratropium-albuterol (DUONEB) 0.5-2.5 (  3) MG/3ML nebulizer solution 3 mL (3 mLs Nebulization Given 12/08/22 0439)  methylPREDNISolone sodium succinate (SOLU-MEDROL) 125 mg/2 mL injection 62.5 mg (62.5 mg Intravenous Given 12/08/22 0458)  potassium chloride SA (KLOR-CON M) CR tablet 40 mEq (40 mEq Oral Given 12/08/22 0456)    ED  Course/ Medical Decision Making/ A&P Clinical Course as of 12/08/22 7209  Fri Dec 08, 2022  0527 Requiring 3 L [place], is supposed to be on O2 at home but does not use it. Also supposed to use CPAP qhs but is non-compliant [SG]    Clinical Course User Index [SG] Jeanell Sparrow, DO                           Medical Decision Making Amount and/or Complexity of Data Reviewed Labs: ordered. Radiology: ordered.  Risk OTC drugs. Prescription drug management. Decision regarding hospitalization.   This patient presents to the ED with chief complaint(s) of dib, cough, fever with pertinent past medical history of copd, dm, as above which further complicates the presenting complaint. The complaint involves an extensive differential diagnosis and also carries with it a high risk of complications and morbidity.    Differential diagnosis for adult fever includes but is not exclusive to community-acquired pneumonia, urinary tract infection, acute cholecystitis, viral syndrome, cellulitis, tick bourne disease,  decubitus ulcer, necrotizing fasciitis, meningitis, encephalitis, influenza, etc.  In my evaluation of this patient's dyspnea my DDx includes, but is not limited to, pneumonia, pulmonary embolism, pneumothorax, pulmonary edema, metabolic acidosis, asthma, COPD, cardiac cause, anemia, anxiety, viral etc.   . Serious etiologies were considered.   The initial plan is to screening labs/imaging/fluids/apap/duoneb/steroids   Additional history obtained: Additional history obtained from  na Records reviewed  prior ed visits, prior labs/imaging/home meds EF 65% 2/19  Independent labs interpretation:  The following labs were independently interpreted:  CBC stable, no leukocytosis BMP with K 3.4, will replace. Cr similar to his baseline today is 1.78, baseline similar  Independent visualization of imaging: - I independently visualized the following imaging with scope of interpretation limited to  determining acute life threatening conditions related to emergency care: CXR, which revealed no acute process  Cardiac monitoring was reviewed and interpreted by myself which shows sinus tachy  Treatment and Reassessment: Apap IVF Duoneb Solumedrol K+ BIPAP Rocephin for copd exacerbation  >> improving  Consultation: - Consulted or discussed management/test interpretation w/ external professional: na  Consideration for admission or further workup: Admission was considered   Pt with poorly controlled COPD, continues to smoke, does not use home oxygen/cpap/or adhere to medication regimen. Here today 2/2 dib. Appears to be COPD exacerbation, no PNA on XR, no leukocytosis. RVP is still pending. He was given duoneb, solumedrol and rocephin. Continues to have increased WOB and tachypnea, initially requiring 3 LPM which he is supposed to use at home but does not, pt placed on bipap 2/2 wob, wob improved. Recommend admission, he is amenable to admission at this time. RVP pending. Will d/w trh.        Social Determinants of health: Counseled patient for approximately 3 minutes regarding smoking cessation. Discussed risks of smoking and how they applied and affected their visit here today. Patient not ready to quit at this time, however will follow up with their primary doctor when they are.   CPT code: [phone]: intermediate counseling for smoking cessation   Social History   Tobacco Use   Smoking status: Every Day  Packs/day: 2.00    Years: 48.00    Total pack years: 96.00    Types: Cigarettes   Smokeless tobacco: Never  Vaping Use   Vaping Use: Never used  Substance Use Topics   Alcohol use: Yes    Alcohol/week: 0.0 standard drinks of alcohol    Comment: "little bit of beer"   Drug use: No            Final Clinical Impression(s) / ED Diagnoses Final diagnoses:  COPD exacerbation ([place])  Respiratory distress  Hypokalemia    Rx / DC Orders ED Discharge Orders      None         Jeanell Sparrow, DO 12/08/22 [phone]

## 2022-12-09 DIAGNOSIS — J101 Influenza due to other identified influenza virus with other respiratory manifestations: Secondary | ICD-10-CM

## 2022-12-09 DIAGNOSIS — J9601 Acute respiratory failure with hypoxia: Secondary | ICD-10-CM | POA: Diagnosis not present

## 2022-12-09 DIAGNOSIS — J441 Chronic obstructive pulmonary disease with (acute) exacerbation: Secondary | ICD-10-CM | POA: Diagnosis not present

## 2022-12-09 LAB — CBC
HCT: 41.6 % (ref 39.0–52.0)
Hemoglobin: 13.3 g/dL (ref 13.0–17.0)
MCH: 29.6 pg (ref 26.0–34.0)
MCHC: 32 g/dL (ref 30.0–36.0)
MCV: 92.4 fL (ref 80.0–100.0)
Platelets: 201 10*3/uL (ref 150–400)
RBC: 4.5 MIL/uL (ref 4.22–5.81)
RDW: 14.8 % (ref 11.5–15.5)
WBC: 6.4 10*3/uL (ref 4.0–10.5)
nRBC: 0 % (ref 0.0–0.2)

## 2022-12-09 LAB — COMPREHENSIVE METABOLIC PANEL
ALT: 74 U/L — ABNORMAL HIGH (ref 0–44)
AST: 93 U/L — ABNORMAL HIGH (ref 15–41)
Albumin: 3.7 g/dL (ref 3.5–5.0)
Alkaline Phosphatase: 27 U/L — ABNORMAL LOW (ref 38–126)
Anion gap: 10 (ref 5–15)
BUN: 25 mg/dL — ABNORMAL HIGH (ref 8–23)
CO2: 24 mmol/L (ref 22–32)
Calcium: 9.8 mg/dL (ref 8.9–10.3)
Chloride: 105 mmol/L (ref 98–111)
Creatinine, Ser: 1.59 mg/dL — ABNORMAL HIGH (ref 0.61–1.24)
GFR, Estimated: 46 mL/min — ABNORMAL LOW (ref >60)
Glucose, Bld: 125 mg/dL — ABNORMAL HIGH (ref 70–99)
Potassium: 3.9 mmol/L (ref 3.5–5.1)
Sodium: 139 mmol/L (ref 135–145)
Total Bilirubin: 0.4 mg/dL (ref 0.3–1.2)
Total Protein: 7.3 g/dL (ref 6.5–8.1)

## 2022-12-09 LAB — GLUCOSE, CAPILLARY
Glucose-Capillary: 102 mg/dL — ABNORMAL HIGH (ref 70–99)
Glucose-Capillary: 123 mg/dL — ABNORMAL HIGH (ref 70–99)

## 2022-12-09 MED ORDER — OSELTAMIVIR PHOSPHATE 30 MG PO CAPS: 30.0000 mg | ORAL_CAPSULE | Freq: Two times a day (BID) | ORAL | 0 refills | Status: AC

## 2022-12-09 MED ORDER — PREDNISONE 20 MG PO TABS: 40.0000 mg | ORAL_TABLET | Freq: Every day | ORAL | 0 refills | Status: AC

## 2022-12-09 NOTE — Discharge Summary (Addendum)
Physician Discharge Summary   Matthew Molina QQI:[id number] DOB: 09-23-1951 DOA: 12/08/2022  PCP: Fayrene Helper, MD  Admit date: 12/08/2022 Discharge date: 12/09/2022  Barriers to discharge: none  Admitted From: Home Disposition:  Home Discharging physician: Dwyane Dee, MD  Recommendations for Outpatient Follow-up:  Continue chronic medical management  Home Health:  Equipment/Devices:   Discharge Condition: stable CODE STATUS: Full Diet recommendation:  Diet Orders (From admission, onward)     Start     Ordered   12/09/22 0000  Diet - low sodium heart healthy        12/09/22 1039   12/09/22 0000  Diet Carb Modified        12/09/22 1039   12/08/22 1343  Diet heart healthy/carb modified Room service appropriate? Yes; Fluid consistency: Thin  Diet effective now       Question Answer Comment  Diet-HS Snack? Nothing   Room service appropriate? Yes   Fluid consistency: Thin      12/08/22 1342            Hospital Course:  Acute respiratory failure with hypoxia - resolved  -Not on oxygen at home - Ambulated on room air prior to discharge with no desaturation   COPD exacerbation -Precipitated by influenza A -Responded well to BiPAP on admission along with breathing treatments and steroids - Continued on steroids at discharge - No antibiotics at discharge felt to be warranted   Influenza A -Started course of Tamiflu   Diabetes mellitus type 2 -Home regimen resumed at discharge   Chronic kidney disease stage IIIb -Baseline creatinine 1.6 - Remains at baseline   Hypertension -Continue home regimen   Tobacco use -Counseled on tobacco cessation   Schizophrenia -Continue on Risperdal   Hyperlipidemia -Home regimen continued   The patient's chronic medical conditions were treated accordingly per the patient's home medication regimen except as noted.  On day of discharge, patient was felt deemed stable for discharge. Patient/family member advised  to call PCP or come back to ER if needed.   Principal Diagnosis: Influenza A  Discharge Diagnoses: Active Hospital Problems   Diagnosis Date Noted   Influenza A 12/08/2022    Priority: 1.   COPD exacerbation ([place]) 12/08/2022    Priority: 2.   Chronic kidney disease, stage 3b ([place]) 05/22/2016   Hypertension 11/05/2015   Tobacco abuse 07/24/2015   Diabetes mellitus type 2 in obese ([place]) 04/17/2011   Schizophrenia, unspecified type ([place]) 06/02/2008   Hyperlipidemia 06/02/2008    Resolved Hospital Problems   Diagnosis Date Noted Date Resolved   Acute respiratory failure with hypoxia [facility]) 12/06/2022 12/09/2022    Priority: 3.     Discharge Instructions     Diet - low sodium heart healthy   Complete by: As directed    Diet Carb Modified   Complete by: As directed    Increase activity slowly   Complete by: As directed    Increase activity slowly   Complete by: As directed       Allergies as of 12/09/2022       Reactions   Sertraline Hcl    Stomach upset/pain   Wellbutrin [bupropion] Other (See Comments)   Makes stomach hurt        Medication List     STOP taking these medications    cefdinir 300 MG capsule Commonly known as: OMNICEF       TAKE these medications    Accu-Chek Guide test strip Generic drug: glucose blood USE AS  DIRECTED TO CHECK BLOOD SUGAR ONCE DAILY.   glucose blood test strip Use as instructed   Accu-Chek Softclix Lancets lancets TEST BLOOD SUGAR ONCE DAILY AS DIRECTED.   Lancets 30G Misc Once daily testing dx e11.9   acetaminophen 500 MG tablet Commonly known as: TYLENOL Take 500-1,000 mg by mouth as needed for headache. Takes mostly for HA about 2 x/week.   albuterol 108 (90 Base) MCG/ACT inhaler Commonly known as: VENTOLIN HFA INHALE 2 PUFFS INTO THE LUNGS EVERY 6 HOURS AS NEEDED FOR WHEEZING OR SHORTNESS OF BREATH. What changed: how much to take   aspirin EC 81 MG tablet Take 81 mg by mouth daily. Swallow whole.    busPIRone 7.5 MG tablet Commonly known as: BUSPAR TAKE (1) TABLET BY MOUTH THREE TIMES A DAY. What changed: See the new instructions.   cyanocobalamin 1000 MCG tablet Commonly known as: VITAMIN B12 Take 1,000 mcg by mouth daily.   D-3-5 125 MCG (5000 UT) capsule Generic drug: Cholecalciferol Take 5,000 Units by mouth daily.   docusate sodium 100 MG capsule Commonly known as: COLACE Take 100 mg by mouth daily as needed for mild constipation.   Farxiga 5 MG Tabs tablet Generic drug: dapagliflozin propanediol Take 5 mg by mouth every morning.   fenofibrate 145 MG tablet Commonly known as: TRICOR TAKE 1 TABLET BY MOUTH DAILY. STOP TAKING EZETIMIBE. What changed: See the new instructions.   glipiZIDE 10 MG 24 hr tablet Commonly known as: GLUCOTROL XL TAKE (2) TABLETS BY MOUTH DAILY EVERY MORNING AT BREAKFAST. What changed: See the new instructions.   ipratropium-albuterol 0.5-2.5 (3) MG/3ML Soln Commonly known as: DUONEB INHALE 1 VIAL VIA NEBULIZER EVERY 4 TO 6 HOURS. What changed: See the new instructions.   meloxicam 7.5 MG tablet Commonly known as: MOBIC TAKE 1 TABLET BY MOUTH ONCE DAILY.   montelukast 10 MG tablet Commonly known as: SINGULAIR TAKE (1) TABLET BY MOUTH AT BEDTIME. What changed: See the new instructions.   nicotine 14 mg/24hr patch Commonly known as: NICODERM CQ - dosed in mg/24 hours Place 1 patch (14 mg total) onto the skin daily.   olmesartan 40 MG tablet Commonly known as: BENICAR TAKE ONE TABLET BY MOUTH DAILY   oseltamivir 30 MG capsule Commonly known as: TAMIFLU Take 1 capsule (30 mg total) by mouth 2 (two) times daily for 4 days.   pantoprazole 40 MG tablet Commonly known as: PROTONIX TAKE ONE TABLET BY MOUTH ONCE DAILY.   predniSONE 20 MG tablet Commonly known as: DELTASONE Take 2 tablets (40 mg total) by mouth daily with breakfast for 4 days. What changed: when to take this   risperiDONE 1 MG tablet Commonly known as:  RISPERDAL TAKE ONE TABLET BY MOUTH IN THE EVENING. TAKE WITH '4MG'$  TABLET. What changed: See the new instructions.   risperidone 4 MG tablet Commonly known as: RISPERDAL TAKE ONE TABLET BY MOUTH IN THE EVENING. TAKE WITH '1MG'$  TABLET. What changed: See the new instructions.   rosuvastatin 10 MG tablet Commonly known as: CRESTOR Take 1 tablet (10 mg total) by mouth daily.   sucralfate 1 g tablet Commonly known as: CARAFATE TAKE 1 TABLET BY MOUTH FOUR TIMES A DAY AS NEEDED. What changed: See the new instructions.   Symbicort 160-4.5 MCG/ACT inhaler Generic drug: budesonide-formoterol INHALE 2 PUFFS INTO THE LUNGS ONCE IN THE MORNING AND AT BEDTIME. What changed: See the new instructions.   tamsulosin 0.4 MG Caps capsule Commonly known as: FLOMAX Take 1 capsule (0.4 mg total) by  mouth daily after supper.   traZODone 50 MG tablet Commonly known as: DESYREL TAKE ONE TABLET BY MOUTH AT BEDTIME AS NEEDED FOR SLEEP. What changed:  reasons to take this additional instructions   triamterene-hydrochlorothiazide 37.5-25 MG tablet Commonly known as: MAXZIDE-25 TAKE (1/2) TABLET BY MOUTH ONCE DAILY. What changed: See the new instructions.        Allergies  Allergen Reactions   Sertraline Hcl     Stomach upset/pain   Wellbutrin [Bupropion] Other (See Comments)    Makes stomach hurt    Consultations:   Procedures:   Discharge Exam: BP (!) 168/101   Pulse 97   Temp 98.3 F (36.8 C) (Oral)   Resp (!) 23   Ht '6\' 1"'$  (1.854 m)   Wt 102.6 kg   SpO2 100%   BMI 29.84 kg/m  Physical Exam Constitutional:      General: He is not in acute distress.    Appearance: Normal appearance.  HENT:     Head: Normocephalic and atraumatic.     Mouth/Throat:     Mouth: Mucous membranes are moist.  Eyes:     Extraocular Movements: Extraocular movements intact.  Cardiovascular:     Rate and Rhythm: Normal rate and regular rhythm.     Heart sounds: Normal heart sounds.  Pulmonary:      Effort: Pulmonary effort is normal. No respiratory distress.     Breath sounds: Normal breath sounds. No wheezing.     Comments: Mild rhonchi appreciated, no wheezing Abdominal:     General: Bowel sounds are normal. There is no distension.     Palpations: Abdomen is soft.     Tenderness: There is no abdominal tenderness.  Musculoskeletal:        General: Normal range of motion.     Cervical back: Normal range of motion and neck supple.  Skin:    General: Skin is warm and dry.  Neurological:     General: No focal deficit present.     Mental Status: He is alert.  Psychiatric:        Mood and Affect: Mood normal.        Behavior: Behavior normal.      The results of significant diagnostics from this hospitalization (including imaging, microbiology, ancillary and laboratory) are listed below for reference.   Microbiology: Recent Results (from the past 240 hour(s))  Resp panel by RT-PCR (RSV, Flu A&B, Covid) Anterior Nasal Swab     Status: None   Collection Time: 12/06/22  9:30 AM   Specimen: Anterior Nasal Swab  Result Value Ref Range Status   SARS Coronavirus 2 by RT PCR NEGATIVE NEGATIVE Final    Comment: (NOTE) SARS-CoV-2 target nucleic acids are NOT DETECTED.  The SARS-CoV-2 RNA is generally detectable in upper respiratory specimens during the acute phase of infection. The lowest concentration of SARS-CoV-2 viral copies this assay can detect is 138 copies/mL. A negative result does not preclude SARS-Cov-2 infection and should not be used as the sole basis for treatment or other patient management decisions. A negative result may occur with  improper specimen collection/handling, submission of specimen other than nasopharyngeal swab, presence of viral mutation(s) within the areas targeted by this assay, and inadequate number of viral copies(<138 copies/mL). A negative result must be combined with clinical observations, patient history, and epidemiological information. The  expected result is Negative.  Fact Sheet for Patients:  EntrepreneurPulse.com.au  Fact Sheet for Healthcare Providers:  IncredibleEmployment.be  This test is no t yet  approved or cleared by the [place] and  has been authorized for detection and/or diagnosis of SARS-CoV-2 by FDA under an Emergency Use Authorization (EUA). This EUA will remain  in effect (meaning this test can be used) for the duration of the COVID-19 declaration under Section 564(b)(1) of the Act, 21 U.S.C.section 360bbb-3(b)(1), unless the authorization is terminated  or revoked sooner.       Influenza A by PCR NEGATIVE NEGATIVE Final   Influenza B by PCR NEGATIVE NEGATIVE Final    Comment: (NOTE) The Xpert Xpress SARS-CoV-2/FLU/RSV plus assay is intended as an aid in the diagnosis of influenza from Nasopharyngeal swab specimens and should not be used as a sole basis for treatment. Nasal washings and aspirates are unacceptable for Xpert Xpress SARS-CoV-2/FLU/RSV testing.  Fact Sheet for Patients: EntrepreneurPulse.com.au  Fact Sheet for Healthcare Providers: IncredibleEmployment.be  This test is not yet approved or cleared by the [place] FDA and has been authorized for detection and/or diagnosis of SARS-CoV-2 by FDA under an Emergency Use Authorization (EUA). This EUA will remain in effect (meaning this test can be used) for the duration of the COVID-19 declaration under Section 564(b)(1) of the Act, 21 U.S.C. section 360bbb-3(b)(1), unless the authorization is terminated or revoked.     Resp Syncytial Virus by PCR NEGATIVE NEGATIVE Final    Comment: (NOTE) Fact Sheet for Patients: EntrepreneurPulse.com.au  Fact Sheet for Healthcare Providers: IncredibleEmployment.be  This test is not yet approved or cleared by the [place] FDA and has been authorized for detection and/or  diagnosis of SARS-CoV-2 by FDA under an Emergency Use Authorization (EUA). This EUA will remain in effect (meaning this test can be used) for the duration of the COVID-19 declaration under Section 564(b)(1) of the Act, 21 U.S.C. section 360bbb-3(b)(1), unless the authorization is terminated or revoked.  Performed at [facility], [address] [place], [place] [address]   Blood culture (routine x 2)     Status: None (Preliminary result)   Collection Time: 12/06/22 11:45 AM   Specimen: BLOOD RIGHT ARM  Result Value Ref Range Status   Specimen Description BLOOD RIGHT ARM  Final   Special Requests   Final    BOTTLES DRAWN AEROBIC AND ANAEROBIC Blood Culture adequate volume   Culture   Final    NO GROWTH 3 DAYS Performed at [facility], [address] [place], [place] [address]    Report Status PENDING  Incomplete  Blood culture (routine x 2)     Status: None (Preliminary result)   Collection Time: 12/06/22 11:45 AM   Specimen: Left Antecubital; Blood  Result Value Ref Range Status   Specimen Description LEFT ANTECUBITAL  Final   Special Requests   Final    BOTTLES DRAWN AEROBIC AND ANAEROBIC Blood Culture adequate volume   Culture   Final    NO GROWTH 3 DAYS Performed at [facility], [address] [place], [place] [address]    Report Status PENDING  Incomplete  Resp panel by RT-PCR (RSV, Flu A&B, Covid) Anterior Nasal Swab     Status: Abnormal   Collection Time: 12/08/22  3:49 AM   Specimen: Anterior Nasal Swab  Result Value Ref Range Status   SARS Coronavirus 2 by RT PCR NEGATIVE NEGATIVE Final    Comment: (NOTE) SARS-CoV-2 target nucleic acids are NOT DETECTED.  The SARS-CoV-2 RNA is generally detectable in upper respiratory specimens during the acute phase of infection. The lowest concentration of SARS-CoV-2 viral copies this assay can detect  is 138 copies/mL. A negative result does not preclude SARS-Cov-2 infection and should not be used as the sole basis for  treatment or other patient management decisions. A negative result may occur with  improper specimen collection/handling, submission of specimen other than nasopharyngeal swab, presence of viral mutation(s) within the areas targeted by this assay, and inadequate number of viral copies(<138 copies/mL). A negative result must be combined with clinical observations, patient history, and epidemiological information. The expected result is Negative.  Fact Sheet for Patients:  EntrepreneurPulse.com.au  Fact Sheet for Healthcare Providers:  IncredibleEmployment.be  This test is no t yet approved or cleared by the [place] FDA and  has been authorized for detection and/or diagnosis of SARS-CoV-2 by FDA under an Emergency Use Authorization (EUA). This EUA will remain  in effect (meaning this test can be used) for the duration of the COVID-19 declaration under Section 564(b)(1) of the Act, 21 U.S.C.section 360bbb-3(b)(1), unless the authorization is terminated  or revoked sooner.       Influenza A by PCR POSITIVE (A) NEGATIVE Final   Influenza B by PCR NEGATIVE NEGATIVE Final    Comment: (NOTE) The Xpert Xpress SARS-CoV-2/FLU/RSV plus assay is intended as an aid in the diagnosis of influenza from Nasopharyngeal swab specimens and should not be used as a sole basis for treatment. Nasal washings and aspirates are unacceptable for Xpert Xpress SARS-CoV-2/FLU/RSV testing.  Fact Sheet for Patients: EntrepreneurPulse.com.au  Fact Sheet for Healthcare Providers: IncredibleEmployment.be  This test is not yet approved or cleared by the [place] FDA and has been authorized for detection and/or diagnosis of SARS-CoV-2 by FDA under an Emergency Use Authorization (EUA). This EUA will remain in effect (meaning this test can be used) for the duration of the COVID-19 declaration under Section 564(b)(1) of the Act, 21  U.S.C. section 360bbb-3(b)(1), unless the authorization is terminated or revoked.     Resp Syncytial Virus by PCR NEGATIVE NEGATIVE Final    Comment: (NOTE) Fact Sheet for Patients: EntrepreneurPulse.com.au  Fact Sheet for Healthcare Providers: IncredibleEmployment.be  This test is not yet approved or cleared by the [place] FDA and has been authorized for detection and/or diagnosis of SARS-CoV-2 by FDA under an Emergency Use Authorization (EUA). This EUA will remain in effect (meaning this test can be used) for the duration of the COVID-19 declaration under Section 564(b)(1) of the Act, 21 U.S.C. section 360bbb-3(b)(1), unless the authorization is terminated or revoked.  Performed at [facility], [address] [place], [place] [address]      Labs: BNP (last 3 results) Recent Labs    12/08/22 0340  BNP 60.4   Basic Metabolic Panel: Recent Labs  Lab 12/06/22 1050 12/08/22 0340 12/09/22 0341  NA 137 137 139  K 4.1 3.4* 3.9  CL 105 103 105  CO2 21* 25 24  GLUCOSE 96 78 125*  BUN 21 28* 25*  CREATININE 1.69* 1.78* 1.59*  CALCIUM 10.1 10.0 9.8   Liver Function Tests: Recent Labs  Lab 12/06/22 1050 12/09/22 0341  AST 43* 93*  ALT 47* 74*  ALKPHOS 34* 27*  BILITOT 0.7 0.4  PROT 7.9 7.3  ALBUMIN 4.3 3.7   Recent Labs  Lab 12/06/22 1050  LIPASE 32   No results for input(s): "AMMONIA" in the last 168 hours. CBC: Recent Labs  Lab 12/06/22 1050 12/08/22 0340 12/09/22 0341  WBC 7.2 6.8 6.4  NEUTROABS 5.8 4.9  --   HGB 13.1 13.6 13.3  HCT 40.1 41.3 41.6  MCV  90.3 90.8 92.4  PLT 211 197 201   Cardiac Enzymes: No results for input(s): "CKTOTAL", "CKMB", "CKMBINDEX", "TROPONINI" in the last 168 hours. BNP: Invalid input(s): "POCBNP" CBG: Recent Labs  Lab 12/08/22 1558 12/08/22 2031 12/08/22 2315 12/09/22 0418 12/09/22 0720  GLUCAP 285* 218* 144* 102* 123*   D-Dimer No results for input(s): "DDIMER"  in the last 72 hours. Hgb A1c No results for input(s): "HGBA1C" in the last 72 hours. Lipid Profile No results for input(s): "CHOL", "HDL", "LDLCALC", "TRIG", "CHOLHDL", "LDLDIRECT" in the last 72 hours. Thyroid function studies No results for input(s): "TSH", "T4TOTAL", "T3FREE", "THYROIDAB" in the last 72 hours.  Invalid input(s): "FREET3" Anemia work up No results for input(s): "VITAMINB12", "FOLATE", "FERRITIN", "TIBC", "IRON", "RETICCTPCT" in the last 72 hours. Urinalysis    Component Value Date/Time   COLORURINE YELLOW 12/06/2022 [place] 12/06/2022 1134   APPEARANCEUR Clear 06/26/2022 1102   LABSPEC 1.017 12/06/2022 1134   PHURINE 6.0 12/06/2022 1134   GLUCOSEU >=500 (A) 12/06/2022 1134   HGBUR NEGATIVE 12/06/2022 1134   HGBUR negative 02/25/2009 1257   BILIRUBINUR NEGATIVE 12/06/2022 1134   BILIRUBINUR Negative 06/26/2022 1102   KETONESUR NEGATIVE 12/06/2022 1134   PROTEINUR 30 (A) 12/06/2022 1134   UROBILINOGEN 0.2 02/25/2009 1257   NITRITE POSITIVE (A) 12/06/2022 1134   LEUKOCYTESUR TRACE (A) 12/06/2022 1134   Sepsis Labs Recent Labs  Lab 12/06/22 1050 12/08/22 0340 12/09/22 0341  WBC 7.2 6.8 6.4   Microbiology Recent Results (from the past 240 hour(s))  Resp panel by RT-PCR (RSV, Flu A&B, Covid) Anterior Nasal Swab     Status: None   Collection Time: 12/06/22  9:30 AM   Specimen: Anterior Nasal Swab  Result Value Ref Range Status   SARS Coronavirus 2 by RT PCR NEGATIVE NEGATIVE Final    Comment: (NOTE) SARS-CoV-2 target nucleic acids are NOT DETECTED.  The SARS-CoV-2 RNA is generally detectable in upper respiratory specimens during the acute phase of infection. The lowest concentration of SARS-CoV-2 viral copies this assay can detect is 138 copies/mL. A negative result does not preclude SARS-Cov-2 infection and should not be used as the sole basis for treatment or other patient management decisions. A negative result may occur with   improper specimen collection/handling, submission of specimen other than nasopharyngeal swab, presence of viral mutation(s) within the areas targeted by this assay, and inadequate number of viral copies(<138 copies/mL). A negative result must be combined with clinical observations, patient history, and epidemiological information. The expected result is Negative.  Fact Sheet for Patients:  EntrepreneurPulse.com.au  Fact Sheet for Healthcare Providers:  IncredibleEmployment.be  This test is no t yet approved or cleared by the [place] FDA and  has been authorized for detection and/or diagnosis of SARS-CoV-2 by FDA under an Emergency Use Authorization (EUA). This EUA will remain  in effect (meaning this test can be used) for the duration of the COVID-19 declaration under Section 564(b)(1) of the Act, 21 U.S.C.section 360bbb-3(b)(1), unless the authorization is terminated  or revoked sooner.       Influenza A by PCR NEGATIVE NEGATIVE Final   Influenza B by PCR NEGATIVE NEGATIVE Final    Comment: (NOTE) The Xpert Xpress SARS-CoV-2/FLU/RSV plus assay is intended as an aid in the diagnosis of influenza from Nasopharyngeal swab specimens and should not be used as a sole basis for treatment. Nasal washings and aspirates are unacceptable for Xpert Xpress SARS-CoV-2/FLU/RSV testing.  Fact Sheet for Patients: EntrepreneurPulse.com.au  Fact Sheet for Healthcare  Providers: IncredibleEmployment.be  This test is not yet approved or cleared by the [place] and has been authorized for detection and/or diagnosis of SARS-CoV-2 by FDA under an Emergency Use Authorization (EUA). This EUA will remain in effect (meaning this test can be used) for the duration of the COVID-19 declaration under Section 564(b)(1) of the Act, 21 U.S.C. section 360bbb-3(b)(1), unless the authorization is terminated or revoked.      Resp Syncytial Virus by PCR NEGATIVE NEGATIVE Final    Comment: (NOTE) Fact Sheet for Patients: EntrepreneurPulse.com.au  Fact Sheet for Healthcare Providers: IncredibleEmployment.be  This test is not yet approved or cleared by the [place] FDA and has been authorized for detection and/or diagnosis of SARS-CoV-2 by FDA under an Emergency Use Authorization (EUA). This EUA will remain in effect (meaning this test can be used) for the duration of the COVID-19 declaration under Section 564(b)(1) of the Act, 21 U.S.C. section 360bbb-3(b)(1), unless the authorization is terminated or revoked.  Performed at [facility], [address], [place], [place] [address]   Blood culture (routine x 2)     Status: None (Preliminary result)   Collection Time: 12/06/22 11:45 AM   Specimen: BLOOD RIGHT ARM  Result Value Ref Range Status   Specimen Description BLOOD RIGHT ARM  Final   Special Requests   Final    BOTTLES DRAWN AEROBIC AND ANAEROBIC Blood Culture adequate volume   Culture   Final    NO GROWTH 3 DAYS Performed at [facility], [address], [place], [place] [address]    Report Status PENDING  Incomplete  Blood culture (routine x 2)     Status: None (Preliminary result)   Collection Time: 12/06/22 11:45 AM   Specimen: Left Antecubital; Blood  Result Value Ref Range Status   Specimen Description LEFT ANTECUBITAL  Final   Special Requests   Final    BOTTLES DRAWN AEROBIC AND ANAEROBIC Blood Culture adequate volume   Culture   Final    NO GROWTH 3 DAYS Performed at [facility], [address], [place], [place] [address]    Report Status PENDING  Incomplete  Resp panel by RT-PCR (RSV, Flu A&B, Covid) Anterior Nasal Swab     Status: Abnormal   Collection Time: 12/08/22  3:49 AM   Specimen: Anterior Nasal Swab  Result Value Ref Range Status   SARS Coronavirus 2 by RT PCR NEGATIVE NEGATIVE Final    Comment: (NOTE) SARS-CoV-2 target nucleic  acids are NOT DETECTED.  The SARS-CoV-2 RNA is generally detectable in upper respiratory specimens during the acute phase of infection. The lowest concentration of SARS-CoV-2 viral copies this assay can detect is 138 copies/mL. A negative result does not preclude SARS-Cov-2 infection and should not be used as the sole basis for treatment or other patient management decisions. A negative result may occur with  improper specimen collection/handling, submission of specimen other than nasopharyngeal swab, presence of viral mutation(s) within the areas targeted by this assay, and inadequate number of viral copies(<138 copies/mL). A negative result must be combined with clinical observations, patient history, and epidemiological information. The expected result is Negative.  Fact Sheet for Patients:  EntrepreneurPulse.com.au  Fact Sheet for Healthcare Providers:  IncredibleEmployment.be  This test is no t yet approved or cleared by the [place] FDA and  has been authorized for detection and/or diagnosis of SARS-CoV-2 by FDA under an Emergency Use Authorization (EUA). This EUA will remain  in effect (meaning this test can be used) for  the duration of the COVID-19 declaration under Section 564(b)(1) of the Act, 21 U.S.C.section 360bbb-3(b)(1), unless the authorization is terminated  or revoked sooner.       Influenza A by PCR POSITIVE (A) NEGATIVE Final   Influenza B by PCR NEGATIVE NEGATIVE Final    Comment: (NOTE) The Xpert Xpress SARS-CoV-2/FLU/RSV plus assay is intended as an aid in the diagnosis of influenza from Nasopharyngeal swab specimens and should not be used as a sole basis for treatment. Nasal washings and aspirates are unacceptable for Xpert Xpress SARS-CoV-2/FLU/RSV testing.  Fact Sheet for Patients: EntrepreneurPulse.com.au  Fact Sheet for Healthcare  Providers: IncredibleEmployment.be  This test is not yet approved or cleared by the [place] FDA and has been authorized for detection and/or diagnosis of SARS-CoV-2 by FDA under an Emergency Use Authorization (EUA). This EUA will remain in effect (meaning this test can be used) for the duration of the COVID-19 declaration under Section 564(b)(1) of the Act, 21 U.S.C. section 360bbb-3(b)(1), unless the authorization is terminated or revoked.     Resp Syncytial Virus by PCR NEGATIVE NEGATIVE Final    Comment: (NOTE) Fact Sheet for Patients: EntrepreneurPulse.com.au  Fact Sheet for Healthcare Providers: IncredibleEmployment.be  This test is not yet approved or cleared by the [place] FDA and has been authorized for detection and/or diagnosis of SARS-CoV-2 by FDA under an Emergency Use Authorization (EUA). This EUA will remain in effect (meaning this test can be used) for the duration of the COVID-19 declaration under Section 564(b)(1) of the Act, 21 U.S.C. section 360bbb-3(b)(1), unless the authorization is terminated or revoked.  Performed at [facility], [address], [place], [place] [address]     Procedures/Studies: DG Chest 2 View  Result Date: 12/08/2022 CLINICAL DATA:  Shortness of breath, difficulty in breathing. EXAM: CHEST - 2 VIEW COMPARISON:  12/06/2022. FINDINGS: The heart size and mediastinal contours are stable. Lung volumes are low. There is no consolidation, effusion, or pneumothorax. No acute osseous abnormality. IMPRESSION: No active cardiopulmonary disease. Electronically Signed   By: Brett Fairy M.D.   On: 12/08/2022 04:05   DG Chest 2 View  Result Date: 12/06/2022 CLINICAL DATA:  cough EXAM: CHEST - 2 VIEW COMPARISON:  01/05/2022 FINDINGS: Cardiac silhouette is unremarkable. No pneumothorax or pleural effusion. The lungs are clear. The visualized skeletal structures are unremarkable.  IMPRESSION: No acute cardiopulmonary process. Electronically Signed   By: Sammie Bench M.D.   On: 12/06/2022 10:30     Time coordinating discharge: Over 30 minutes    Dwyane Dee, MD  [facility] 12/09/2022, 3:24 PM

## 2022-12-09 NOTE — Progress Notes (Signed)
Pt's belongings given to pt. Pt transported via wheelchair. Daugther, Glo Herring, picked up pt to his private residence.

## 2022-12-09 NOTE — Progress Notes (Signed)
1000AM pt performed walk test. He tolerated walking 2 laps around the unit. His O2 remained above 93% without oxygen.

## 2022-12-11 LAB — CULTURE, BLOOD (ROUTINE X 2)
Culture: NO GROWTH
Culture: NO GROWTH
Special Requests: ADEQUATE
Special Requests: ADEQUATE

## 2022-12-12 ENCOUNTER — Telehealth: Payer: Self-pay

## 2022-12-12 LAB — HEMOGLOBIN A1C
Hgb A1c MFr Bld: 6.6 % — ABNORMAL HIGH (ref 4.8–5.6)
Mean Plasma Glucose: 143 mg/dL

## 2022-12-12 NOTE — Patient Outreach (Signed)
  Care Coordination TOC Note Transition Care Management Unsuccessful Follow-up Telephone Call  Date of discharge and from where:  [facility] 12/08/22-12/09/22  Attempts:  1st Attempt  Reason for unsuccessful TCM follow-up call:  Left voice message  Johnney Killian, RN, BSN, CCM Care Management Coordinator [facility] Phone: [phone]: [phone]

## 2022-12-13 ENCOUNTER — Telehealth: Payer: Self-pay

## 2022-12-13 ENCOUNTER — Telehealth: Payer: Self-pay | Admitting: Family Medicine

## 2022-12-13 NOTE — Telephone Encounter (Signed)
Pt called stating he was discharged on 12.23.23 & has rt lung pneumonia & Flu. He is scheduled for TOC on 1.3.23. he is wanting to know if he can please get some cough medicine. States he is coughing really bad.

## 2022-12-13 NOTE — Patient Outreach (Signed)
  Care Coordination TOC Note Transition Care Management Follow-up Telephone Call Date of discharge and from where: [facility] 12/09/22 How have you been since you were released from the hospital? Patient notes he left a message at his PCP office this morning as I am coughing so much. Any questions or concerns? Yes- Patient was having difficulty having a conversation as he was coughing so much.  This Probation officer sent a message directly to PCP to let her know of patient not getting better and his cough is not resolving.    Items Reviewed: Did the pt receive and understand the discharge instructions provided? Yes  Medications obtained and verified? Yes  Other? No  Any new allergies since your discharge? No  Dietary orders reviewed? No Do you have support at home? Yes -Encouraged patient who lives alone that he should have support if he needs it. He has a friend that can stay with him and is close by to help him when needed.  Home Care and Equipment/Supplies: Were home health services ordered? not applicable If so, what is the name of the agency? N/A  Has the agency set up a time to come to the patient's home? no Were any new equipment or medical supplies ordered?  No What is the name of the medical supply agency? N/A Were you able to get the supplies/equipment? not applicable Do you have any questions related to the use of the equipment or supplies? No  Functional Questionnaire: (I = Independent and D = Dependent) ADLs: I  Bathing/Dressing- I  Meal Prep- I  Eating- I  Maintaining continence- I  Transferring/Ambulation- I  Managing Meds- I  Follow up appointments reviewed:  PCP Hospital f/u appt confirmed? No   Specialist Hospital f/u appt confirmed? No   Are transportation arrangements needed? No  If their condition worsens, is the pt aware to call PCP or go to the Emergency Dept.? Yes Was the patient provided with contact information for the PCP's office or ED? Yes Was to pt  encouraged to call back with questions or concerns? Yes  SDOH assessments and interventions completed:   Yes SDOH Interventions Today    Flowsheet Row Most Recent Value  SDOH Interventions   Housing Interventions Intervention Not Indicated  Transportation Interventions Intervention Not Indicated  Financial Strain Interventions Intervention Not Indicated       Care Coordination Interventions:  Message sent to [name] with patient concerns over cough and recent hospitalization    Encounter Outcome:  Pt. Visit Completed

## 2022-12-14 ENCOUNTER — Other Ambulatory Visit: Payer: Self-pay | Admitting: Family Medicine

## 2022-12-15 ENCOUNTER — Encounter: Payer: Self-pay | Admitting: Family Medicine

## 2022-12-15 ENCOUNTER — Ambulatory Visit (INDEPENDENT_AMBULATORY_CARE_PROVIDER_SITE_OTHER): Payer: Medicare Other | Admitting: Family Medicine

## 2022-12-15 VITALS — BP 121/77 | HR 106 | Resp 18 | Ht 73.0 in | Wt 222.0 lb

## 2022-12-15 DIAGNOSIS — E669 Obesity, unspecified: Secondary | ICD-10-CM

## 2022-12-15 DIAGNOSIS — J441 Chronic obstructive pulmonary disease with (acute) exacerbation: Secondary | ICD-10-CM | POA: Diagnosis not present

## 2022-12-15 DIAGNOSIS — I10 Essential (primary) hypertension: Secondary | ICD-10-CM | POA: Diagnosis not present

## 2022-12-15 DIAGNOSIS — J101 Influenza due to other identified influenza virus with other respiratory manifestations: Secondary | ICD-10-CM | POA: Diagnosis not present

## 2022-12-15 DIAGNOSIS — J9611 Chronic respiratory failure with hypoxia: Secondary | ICD-10-CM | POA: Diagnosis not present

## 2022-12-15 DIAGNOSIS — Z7689 Persons encountering health services in other specified circumstances: Secondary | ICD-10-CM

## 2022-12-15 DIAGNOSIS — E1169 Type 2 diabetes mellitus with other specified complication: Secondary | ICD-10-CM

## 2022-12-15 DIAGNOSIS — F209 Schizophrenia, unspecified: Secondary | ICD-10-CM

## 2022-12-15 MED ORDER — PSEUDOEPH-BROMPHEN-DM 30-2-10 MG/5ML PO SYRP: 5.0000 mL | ORAL_SOLUTION | Freq: Three times a day (TID) | ORAL | 0 refills | Status: DC | PRN

## 2022-12-15 MED ORDER — PREDNISONE 10 MG (21) PO TBPK: ORAL_TABLET | ORAL | 0 refills | Status: DC

## 2022-12-15 NOTE — Patient Instructions (Signed)
F/U as befor3e, please call if you need me soonner  Prednisone dose pack to be started after your current prednisone  Stay nicotine free, read the Bible!  Cough suppressant syrup is prescribed

## 2022-12-17 ENCOUNTER — Encounter: Payer: Self-pay | Admitting: Family Medicine

## 2022-12-17 DIAGNOSIS — Z7689 Persons encountering health services in other specified circumstances: Secondary | ICD-10-CM | POA: Insufficient documentation

## 2022-12-17 NOTE — Assessment & Plan Note (Signed)
Controlled, no change in medication  

## 2022-12-17 NOTE — Progress Notes (Signed)
   Matthew Molina     MRN: [id number]      DOB: 02-15-1951 HPI Matthew Molina is here for [facility] visit following hospitalization from 12/22 to 12/09/2022,  due to influenza A causing acute resp failure with hypoxia and COPD exacerbation Treatment was with steroids and tamiflu, reports continued improvement at home, no fever or chills, however cough still disturbing and feels that breathing could be better Has stopped smoking since ill ROS Denies chest pains, palpitations and leg swelling Denies abdominal pain, nausea, vomiting,diarrhea or constipation.   Denies dysuria, frequency, hesitancy or incontinence. Chronic  joint pain, swelling and limitation in mobility. Denies headaches, seizures, numbness, or tingling. Denies uncontrolled  depression, anxiety or insomnia. Denies skin break down or rash.   PE  BP 121/77   Pulse (!) 106   Resp 18   Ht '6\' 1"'$  (1.854 m)   Wt 222 lb (100.7 kg)   SpO2 90%   BMI 29.29 kg/m   Patient alert and oriented and in mild  cardiopulmonary distress.  HEENT: No facial asymmetry, EOMI,     Neck supple .  Chest: decreased air entry , bilateral crackles and wheezes  CVS: S1, S2 no murmurs, no S3.Regular rate.  ABD: Soft non tender.   Ext: No edema  MS: decreased ROM spine, shoulders, hips and knees.  Skin: Intact, no ulcerations or rash noted.  Psych: Good eye contact, normal affect. Memory intact not anxious or depressed appearing.  CNS: CN 2-12 intact, power,  normal throughout.no focal deficits noted.   Assessment & Plan  Chronic respiratory failure with hypoxia (HCC) Resolved however needs additional course of steroids, pred 10 mg dose pack prescribed  COPD exacerbation (HCC) Trigger is inflluenza, phenergan DM and prednisone prescribed  Influenza A Completing course of anti viral  Hypertension Controlled, no change in medication   Diabetes mellitus type 2 in obese (HCC) Controlled, no change in medication   Schizophrenia,  unspecified type ([place]) Controlled on currentmeds  Encounter for support and coordination of transition of care Patient in for follow up of recent hospitalization. Discharge summary, and laboratory and radiology data are reviewed, and any questions or concerns  are discussed. Specific issues requiring follow up are specifically addressed.

## 2022-12-17 NOTE — Assessment & Plan Note (Signed)
Patient in for follow up of recent hospitalization. Discharge summary, and laboratory and radiology data are reviewed, and any questions or concerns  are discussed. Specific issues requiring follow up are specifically addressed.  

## 2022-12-17 NOTE — Assessment & Plan Note (Signed)
Resolved however needs additional course of steroids, pred 10 mg dose pack prescribed

## 2022-12-17 NOTE — Assessment & Plan Note (Signed)
Trigger is inflluenza, phenergan DM and prednisone prescribed

## 2022-12-17 NOTE — Assessment & Plan Note (Signed)
Controlled on current meds.

## 2022-12-17 NOTE — Assessment & Plan Note (Signed)
Completing course of anti viral

## 2022-12-19 ENCOUNTER — Other Ambulatory Visit: Payer: Self-pay | Admitting: Internal Medicine

## 2022-12-19 ENCOUNTER — Other Ambulatory Visit: Payer: Self-pay | Admitting: Family Medicine

## 2022-12-19 DIAGNOSIS — M25552 Pain in left hip: Secondary | ICD-10-CM

## 2022-12-19 DIAGNOSIS — F209 Schizophrenia, unspecified: Secondary | ICD-10-CM

## 2022-12-19 DIAGNOSIS — J449 Chronic obstructive pulmonary disease, unspecified: Secondary | ICD-10-CM

## 2022-12-20 ENCOUNTER — Other Ambulatory Visit: Payer: Self-pay | Admitting: Family Medicine

## 2022-12-20 ENCOUNTER — Inpatient Hospital Stay: Payer: Medicare Other | Admitting: Family Medicine

## 2022-12-27 ENCOUNTER — Other Ambulatory Visit: Payer: Self-pay | Admitting: Family Medicine

## 2022-12-29 NOTE — Progress Notes (Signed)
Called patient to arrange follow-up LDCT. Patient does not wish to schedule at this time, but requests a call back at a later date.

## 2023-01-01 DIAGNOSIS — J449 Chronic obstructive pulmonary disease, unspecified: Secondary | ICD-10-CM | POA: Diagnosis not present

## 2023-01-02 ENCOUNTER — Other Ambulatory Visit: Payer: Self-pay | Admitting: Family Medicine

## 2023-01-15 ENCOUNTER — Other Ambulatory Visit: Payer: Self-pay | Admitting: Family Medicine

## 2023-01-15 ENCOUNTER — Other Ambulatory Visit: Payer: Self-pay | Admitting: Gastroenterology

## 2023-01-15 DIAGNOSIS — M25551 Pain in right hip: Secondary | ICD-10-CM

## 2023-01-15 DIAGNOSIS — R1084 Generalized abdominal pain: Secondary | ICD-10-CM

## 2023-01-15 DIAGNOSIS — K219 Gastro-esophageal reflux disease without esophagitis: Secondary | ICD-10-CM

## 2023-01-15 DIAGNOSIS — K922 Gastrointestinal hemorrhage, unspecified: Secondary | ICD-10-CM

## 2023-01-15 DIAGNOSIS — J449 Chronic obstructive pulmonary disease, unspecified: Secondary | ICD-10-CM

## 2023-01-15 DIAGNOSIS — K649 Unspecified hemorrhoids: Secondary | ICD-10-CM

## 2023-01-18 DIAGNOSIS — J449 Chronic obstructive pulmonary disease, unspecified: Secondary | ICD-10-CM | POA: Diagnosis not present

## 2023-02-01 ENCOUNTER — Other Ambulatory Visit: Payer: Self-pay

## 2023-02-01 ENCOUNTER — Other Ambulatory Visit: Payer: Self-pay | Admitting: Family Medicine

## 2023-02-01 DIAGNOSIS — J449 Chronic obstructive pulmonary disease, unspecified: Secondary | ICD-10-CM | POA: Diagnosis not present

## 2023-02-01 DIAGNOSIS — Z122 Encounter for screening for malignant neoplasm of respiratory organs: Secondary | ICD-10-CM

## 2023-02-01 DIAGNOSIS — Z87891 Personal history of nicotine dependence: Secondary | ICD-10-CM

## 2023-02-01 NOTE — Progress Notes (Signed)
LDCT order placed per protocol. Scan scheduled for 03/18 at 1030

## 2023-02-12 ENCOUNTER — Telehealth: Payer: Self-pay | Admitting: Family Medicine

## 2023-02-12 ENCOUNTER — Other Ambulatory Visit: Payer: Self-pay | Admitting: Internal Medicine

## 2023-02-12 ENCOUNTER — Other Ambulatory Visit: Payer: Self-pay | Admitting: Family Medicine

## 2023-02-12 ENCOUNTER — Other Ambulatory Visit: Payer: Self-pay

## 2023-02-12 DIAGNOSIS — M25551 Pain in right hip: Secondary | ICD-10-CM

## 2023-02-12 DIAGNOSIS — J449 Chronic obstructive pulmonary disease, unspecified: Secondary | ICD-10-CM

## 2023-02-12 MED ORDER — BUSPIRONE HCL 7.5 MG PO TABS: 7.5000 mg | ORAL_TABLET | Freq: Three times a day (TID) | ORAL | 0 refills | Status: DC

## 2023-02-12 NOTE — Telephone Encounter (Signed)
Patient called refill  busPIRone (BUSPAR) 7.5 MG tablet [id number]   Pharmacy: [facility]

## 2023-02-12 NOTE — Telephone Encounter (Signed)
Refills sent

## 2023-02-15 ENCOUNTER — Encounter: Payer: Self-pay | Admitting: Radiology

## 2023-02-19 ENCOUNTER — Other Ambulatory Visit: Payer: Self-pay | Admitting: Family Medicine

## 2023-02-20 ENCOUNTER — Ambulatory Visit: Payer: Medicare Other | Admitting: Family Medicine

## 2023-02-20 LAB — HM DIABETES EYE EXAM

## 2023-02-21 ENCOUNTER — Ambulatory Visit (INDEPENDENT_AMBULATORY_CARE_PROVIDER_SITE_OTHER): Payer: 59 | Admitting: Podiatry

## 2023-02-21 ENCOUNTER — Encounter: Payer: Self-pay | Admitting: Podiatry

## 2023-02-21 DIAGNOSIS — Q828 Other specified congenital malformations of skin: Secondary | ICD-10-CM

## 2023-02-21 DIAGNOSIS — M79676 Pain in unspecified toe(s): Secondary | ICD-10-CM

## 2023-02-21 DIAGNOSIS — B351 Tinea unguium: Secondary | ICD-10-CM | POA: Diagnosis not present

## 2023-02-21 DIAGNOSIS — E1149 Type 2 diabetes mellitus with other diabetic neurological complication: Secondary | ICD-10-CM

## 2023-02-21 DIAGNOSIS — E1140 Type 2 diabetes mellitus with diabetic neuropathy, unspecified: Secondary | ICD-10-CM

## 2023-02-21 NOTE — Progress Notes (Signed)
This patient returns to my office for at risk foot care.  This patient requires this care by a professional since this patient will be at risk due to having diabetes mellitus and chronic kidney disease.  Patient has pain in the two calluses on the bottom outside of his feet.  This patient is unable to cut nails himself since the patient cannot reach his nails.These nails are painful walking and wearing shoes. He cannot treat the painful calluses himself. . This patient presents for at risk foot care today.  General Appearance  Alert, conversant and in no acute stress.  Vascular  Dorsalis pedis and posterior tibial  pulses are palpable  bilaterally.  Capillary return is within normal limits  bilaterally. Temperature is within normal limits  bilaterally.  Neurologic  Senn-Weinstein monofilament wire test diminished   bilaterally. Muscle power within normal limits bilaterally.  Nails Thick disfigured discolored nails with subungual debris  from hallux to fifth toes bilaterally. No evidence of bacterial infection or drainage bilaterally.  Orthopedic  No limitations of motion  feet .  No crepitus or effusions noted.  No bony pathology or digital deformities noted.  Skin  normotropic skin  noted bilaterally.  No signs of infections or ulcers noted.   Porokeratosis sub 5th metabase  B/L.  Onychomycosis  Pain in right toes  Pain in left toes  Porokeratosis  X 2.  Consent was obtained for treatment procedures.   Mechanical debridement of nails 1-5  bilaterally performed with a nail nipper.  Filed with dremel without incident. Debridement of porokeratosis  with # 15 blade and dremel tool.     Return office visit   3 months                  Told patient to return for periodic foot care and evaluation due to potential at risk complications.   Gardiner Barefoot DPM

## 2023-02-26 ENCOUNTER — Encounter: Payer: Self-pay | Admitting: *Deleted

## 2023-02-28 DIAGNOSIS — E1129 Type 2 diabetes mellitus with other diabetic kidney complication: Secondary | ICD-10-CM | POA: Diagnosis not present

## 2023-02-28 DIAGNOSIS — E1122 Type 2 diabetes mellitus with diabetic chronic kidney disease: Secondary | ICD-10-CM | POA: Diagnosis not present

## 2023-02-28 DIAGNOSIS — N189 Chronic kidney disease, unspecified: Secondary | ICD-10-CM | POA: Diagnosis not present

## 2023-02-28 DIAGNOSIS — R809 Proteinuria, unspecified: Secondary | ICD-10-CM | POA: Diagnosis not present

## 2023-03-02 ENCOUNTER — Ambulatory Visit (HOSPITAL_COMMUNITY): Payer: 59

## 2023-03-02 DIAGNOSIS — J449 Chronic obstructive pulmonary disease, unspecified: Secondary | ICD-10-CM | POA: Diagnosis not present

## 2023-03-05 ENCOUNTER — Ambulatory Visit (HOSPITAL_COMMUNITY)
Admission: RE | Admit: 2023-03-05 | Discharge: 2023-03-05 | Disposition: A | Discharge status: Home / Self Care | Payer: 59 | Source: Ambulatory Visit | Attending: Physician Assistant | Admitting: Physician Assistant

## 2023-03-05 DIAGNOSIS — Z87891 Personal history of nicotine dependence: Secondary | ICD-10-CM | POA: Insufficient documentation

## 2023-03-05 DIAGNOSIS — Z122 Encounter for screening for malignant neoplasm of respiratory organs: Secondary | ICD-10-CM | POA: Diagnosis not present

## 2023-03-05 DIAGNOSIS — F17210 Nicotine dependence, cigarettes, uncomplicated: Secondary | ICD-10-CM | POA: Diagnosis not present

## 2023-03-07 ENCOUNTER — Encounter: Payer: Self-pay | Admitting: *Deleted

## 2023-03-07 ENCOUNTER — Other Ambulatory Visit: Payer: Self-pay | Admitting: Family Medicine

## 2023-03-07 NOTE — Progress Notes (Signed)
Call report on abnormal CT lung screening result.  Matthew Abernethy, PA-C notified.

## 2023-03-08 ENCOUNTER — Telehealth: Payer: Self-pay | Admitting: Physician Assistant

## 2023-03-08 DIAGNOSIS — E1129 Type 2 diabetes mellitus with other diabetic kidney complication: Secondary | ICD-10-CM | POA: Diagnosis not present

## 2023-03-08 DIAGNOSIS — K2289 Other specified disease of esophagus: Secondary | ICD-10-CM

## 2023-03-08 DIAGNOSIS — R809 Proteinuria, unspecified: Secondary | ICD-10-CM | POA: Diagnosis not present

## 2023-03-08 DIAGNOSIS — D472 Monoclonal gammopathy: Secondary | ICD-10-CM | POA: Diagnosis not present

## 2023-03-08 DIAGNOSIS — N189 Chronic kidney disease, unspecified: Secondary | ICD-10-CM | POA: Diagnosis not present

## 2023-03-08 DIAGNOSIS — D638 Anemia in other chronic diseases classified elsewhere: Secondary | ICD-10-CM | POA: Diagnosis not present

## 2023-03-08 DIAGNOSIS — E1122 Type 2 diabetes mellitus with diabetic chronic kidney disease: Secondary | ICD-10-CM | POA: Diagnosis not present

## 2023-03-09 ENCOUNTER — Ambulatory Visit: Payer: Medicare Other | Admitting: Family Medicine

## 2023-03-09 ENCOUNTER — Encounter: Payer: Self-pay | Admitting: Family Medicine

## 2023-03-09 NOTE — Telephone Encounter (Signed)
I spoke with the patient Matthew Molina, DOB 04-26-1951) and informed him of esophageal thickening as seen on CT scan of his chest.  Referral was placed to gastroenterology to consider EGD to rule out esophageal malignancy.  Patient verbalizes understanding and agreement with the above plan.  Harriett Rush, PA-C 03/09/23 1:31 PM

## 2023-03-15 ENCOUNTER — Encounter: Payer: Self-pay | Admitting: *Deleted

## 2023-03-15 ENCOUNTER — Telehealth: Payer: Self-pay | Admitting: *Deleted

## 2023-03-15 ENCOUNTER — Encounter: Payer: Self-pay | Admitting: Internal Medicine

## 2023-03-15 ENCOUNTER — Ambulatory Visit (INDEPENDENT_AMBULATORY_CARE_PROVIDER_SITE_OTHER): Payer: 59 | Admitting: Internal Medicine

## 2023-03-15 VITALS — BP 128/84 | HR 75 | Temp 97.5°F | Ht 73.0 in | Wt 234.2 lb

## 2023-03-15 DIAGNOSIS — K219 Gastro-esophageal reflux disease without esophagitis: Secondary | ICD-10-CM | POA: Diagnosis not present

## 2023-03-15 DIAGNOSIS — R933 Abnormal findings on diagnostic imaging of other parts of digestive tract: Secondary | ICD-10-CM | POA: Diagnosis not present

## 2023-03-15 DIAGNOSIS — K7581 Nonalcoholic steatohepatitis (NASH): Secondary | ICD-10-CM

## 2023-03-15 DIAGNOSIS — R7989 Other specified abnormal findings of blood chemistry: Secondary | ICD-10-CM

## 2023-03-15 DIAGNOSIS — D122 Benign neoplasm of ascending colon: Secondary | ICD-10-CM

## 2023-03-15 NOTE — Telephone Encounter (Signed)
That is unfortunate.  I stressed to both him and his wife today about the possibility of esophageal cancer based on his CT scan and risks of not pursuing EGD to further evaluate.

## 2023-03-15 NOTE — Telephone Encounter (Signed)
Patient called in. He wants to cancel her EGD on 5/6. He just doesn't want done right now. He stated when he was ready to r/s he will call back. FYI Dr. Abbey Chatters

## 2023-03-15 NOTE — Telephone Encounter (Signed)
Tammy, can you follow up with patient next week to see if he is willing to reschedule.

## 2023-03-15 NOTE — Patient Instructions (Signed)
We will schedule you for upper endoscopy to further evaluate your abnormal CT scan.  Continue on pantoprazole daily for your chronic reflux.  We will hold off on scheduling colonoscopy for now.  Your liver function tests are slightly elevated likely related to fatty liver.  Recommend 1-2# weight loss per week until ideal body weight through exercise & diet. Low fat/cholesterol diet.   Avoid sweets, sodas, fruit juices, sweetened beverages like tea, etc. Gradually increase exercise from 15 min daily up to 1 hr per day 5 days/week. Limit alcohol use.  Further recommendations after upper endoscopy.  It was very nice meeting both you today.  Dr. Abbey Chatters

## 2023-03-15 NOTE — Progress Notes (Signed)
Referring Provider: Harriett Rush, * Primary Care Physician:  Fayrene Helper, MD Primary GI:  Dr. Abbey Chatters  Chief Complaint  Patient presents with   New Patient (Initial Visit)    Referred for esophageal thickening    HPI:   Matthew Molina is a 72 y.o. male who presents to the clinic today to discuss recent abnormal CT scan.  Has not been seen in our office since 2022.  Recently underwent CT chest for lung cancer screening.  Found to have distal esophageal wall thickening with recommendations for upper endoscopy to rule out malignancy.  Patient denies any dysphagia.  Does have chronic reflux which is well-controlled on pantoprazole daily.  No family history of gastrointestinal cancer.  No melena hematochezia.  Does take Mobic daily for joint pains.  Last EGD March 2017 with esophageal stricture status post dilation, gastritis and duodenitis, biopsies positive for H. pylori.  Initially treated with Prevpac and subsequently Pylera.  Eradication testing negative.  Colonoscopy completed 04/01/2020 which found 7 total colon polyps, diverticulosis in the entire colon, external and internal hemorrhoids.  Surgical pathology found the polyps to be tubular adenoma.  Recommended repeat colonoscopy in 3 years (2024).   Also history of steatohepatitis in the setting of chronic alcohol use, obesity, dyslipidemia, diabetes.  Ultrasound March 2022 with fatty liver.  Patient does continue to drink 1 can of beer per day.  Most recent LFTs: AST 93, ALT 74, T. bili 0.4, alk phos 27.  Normal albumin and platelets.  Past Medical History:  Diagnosis Date   Acute blood loss anemia 01/05/2020   Acute kidney injury ([place])    Acute respiratory failure with hypoxia ([facility])    Arthritis    COPD (chronic obstructive pulmonary disease) ([place])    Depression    Diabetes mellitus    Diabetes mellitus without complication ([place])    Diverticulitis    Elevated PSA 10/01/2016   Encounter for support and  coordination of transition of care 01/14/2020   Hypercholesterolemia    Hyperlipidemia    Hypertension    Insomnia 01/05/2016   Ischemic colitis ([place]) 01/05/2020   Multiple lung nodules on CT 04/02/2015   Nicotine addiction    Obesity    Oxygen deficiency    qhs   Schizophrenia [facility])     Past Surgical History:  Procedure Laterality Date   CATARACT EXTRACTION W/PHACO Left 11/20/2013   Procedure: CATARACT EXTRACTION PHACO AND INTRAOCULAR LENS PLACEMENT ([place]);  Surgeon: Tonny Branch, MD;  Location: [facility];  Service: Ophthalmology;  Laterality: Left;  CDE:10.26   CATARACT EXTRACTION W/PHACO Right 12/08/2013   Procedure: RIGHT EYE CATARACT EXTRACTION PHACO AND INTRAOCULAR LENS PLACEMENT ;  Surgeon: Tonny Branch, MD;  Location: [facility];  Service: Ophthalmology;  Laterality: Right;  CDE 12.38   COLONOSCOPY N/A 04/01/2013   pancolonic diverticulosis, redundant colon, large internal hemorrhoids.    COLONOSCOPY  2014   INCOMPLETE PREP IN R COLON   COLONOSCOPY N/A 04/01/2020   Procedure: COLONOSCOPY;  Surgeon: Danie Binder, MD;  Location: [facility];  Service: Endoscopy;  Laterality: N/A;  8:45am   ESOPHAGOGASTRODUODENOSCOPY N/A 02/18/2016   stricture at GE junction, moderate erosive gastritis and mild non-erosive duodenitis. +H.pylori. Treated initially with Prevpac. Breath test to check for eradication was positive, and he was prescribed Pylera.    EYE SURGERY Left 11/2013   cataract extraction   POLYPECTOMY  04/01/2020   Procedure: POLYPECTOMY;  Surgeon: Danie Binder, MD;  Location: [facility];  Service: Endoscopy;;   SAVORY DILATION N/A 02/18/2016   Procedure: SAVORY DILATION;  Surgeon: Danie Binder, MD;  Location: [facility];  Service: Endoscopy;  Laterality: N/A;   SHOULDER SURGERY      Current Outpatient Medications  Medication Sig Dispense Refill   ACCU-CHEK GUIDE test strip USE AS DIRECTED TO CHECK BLOOD SUGAR ONCE DAILY. 50 strip 0   Accu-Chek Softclix Lancets lancets TEST  BLOOD SUGAR ONCE DAILY AS DIRECTED. 100 each 5   acetaminophen (TYLENOL) 500 MG tablet Take 500-1,000 mg by mouth as needed for headache. Takes mostly for HA about 2 x/week.     albuterol (VENTOLIN HFA) 108 (90 Base) MCG/ACT inhaler INHALE 2 PUFFS INTO THE LUNGS EVERY 6 HOURS AS NEEDED FOR WHEEZING OR SHORTNESS OF BREATH. 8.5 g 0   aspirin 81 MG EC tablet Take 81 mg by mouth daily. Swallow whole.     busPIRone (BUSPAR) 7.5 MG tablet Take 1 tablet (7.5 mg total) by mouth 3 (three) times daily. TAKE (1) TABLET BY MOUTH THREE TIMES A DAY. 90 tablet 0   carvedilol (COREG) 6.25 MG tablet Take 6.25 mg by mouth 2 (two) times daily with a meal.     Cholecalciferol (D-3-5) 125 MCG (5000 UT) capsule Take 5,000 Units by mouth daily.     docusate sodium (COLACE) 100 MG capsule Take 100 mg by mouth daily as needed for mild constipation.      fenofibrate (TRICOR) 145 MG tablet TAKE 1 TABLET BY MOUTH DAILY. STOP TAKING EZETIMIBE. 30 tablet 0   glipiZIDE (GLUCOTROL XL) 10 MG 24 hr tablet TAKE (2) TABLETS BY MOUTH DAILY EVERY MORNING AT BREAKFAST. 60 tablet 0   glucose blood test strip Use as instructed 100 each 12   ipratropium-albuterol (DUONEB) 0.5-2.5 (3) MG/3ML SOLN INHALE 1 VIAL VIA NEBULIZER EVERY 4 TO 6 HOURS. 540 mL 0   Lancets 30G MISC Once daily testing dx e11.9 100 each 5   meloxicam (MOBIC) 7.5 MG tablet TAKE 1 TABLET BY MOUTH ONCE DAILY. 30 tablet 0   olmesartan (BENICAR) 40 MG tablet TAKE ONE TABLET BY MOUTH DAILY 90 tablet 0   pantoprazole (PROTONIX) 40 MG tablet TAKE ONE TABLET BY MOUTH ONCE DAILY. 30 tablet 5   risperiDONE (RISPERDAL) 1 MG tablet TAKE ONE TABLET BY MOUTH IN THE EVENING. TAKE WITH 4MG  TABLET. 90 tablet 0   risperidone (RISPERDAL) 4 MG tablet TAKE ONE TABLET BY MOUTH IN THE EVENING. TAKE WITH 1MG  TABLET. 90 tablet 0   rosuvastatin (CRESTOR) 10 MG tablet Take 1 tablet (10 mg total) by mouth daily. Please call [phone] to schedule an appointment for future refills. Thank you. 1st  attempt. 30 tablet 0   SYMBICORT 160-4.5 MCG/ACT inhaler INHALE 2 PUFFS INTO THE LUNGS ONCE IN THE MORNING AND AT BEDTIME. 10.2 g 0   tamsulosin (FLOMAX) 0.4 MG CAPS capsule Take 1 capsule (0.4 mg total) by mouth daily after supper. 90 capsule 3   traZODone (DESYREL) 50 MG tablet TAKE ONE TABLET BY MOUTH AT BEDTIME AS NEEDED FOR SLEEP. 90 tablet 0   vitamin B-12 (CYANOCOBALAMIN) 1000 MCG tablet Take 1,000 mcg by mouth daily.     brompheniramine-pseudoephedrine-DM 30-2-10 MG/5ML syrup Take 5 mLs by mouth 3 (three) times daily as needed. (Patient not taking: Reported on 03/15/2023) 120 mL 0   FARXIGA 5 MG TABS tablet Take 5 mg by mouth every morning. (Patient not taking: Reported on 03/15/2023)     montelukast (SINGULAIR) 10 MG tablet TAKE (1) TABLET  BY MOUTH AT BEDTIME. (Patient not taking: Reported on 03/15/2023) 30 tablet 0   nicotine (NICODERM CQ - DOSED IN MG/24 HOURS) 14 mg/24hr patch Place 1 patch (14 mg total) onto the skin daily. (Patient not taking: Reported on 03/15/2023) 28 patch 1   predniSONE (STERAPRED UNI-PAK 21 TAB) 10 MG (21) TBPK tablet Use as directed (Patient not taking: Reported on 03/15/2023) 21 tablet 0   sucralfate (CARAFATE) 1 g tablet TAKE 1 TABLET BY MOUTH FOUR TIMES A DAY AS NEEDED. (Patient not taking: Reported on 03/15/2023) 30 tablet 0   triamterene-hydrochlorothiazide (MAXZIDE-25) 37.5-25 MG tablet TAKE (1/2) TABLET BY MOUTH ONCE DAILY. (Patient not taking: Reported on 03/15/2023) 45 tablet 0   No current facility-administered medications for this visit.    Allergies as of 03/15/2023 - Review Complete 03/15/2023  Allergen Reaction Noted   Sertraline hcl  04/17/2011   Wellbutrin [bupropion] Other (See Comments) 07/29/2015    Family History  Problem Relation Age of Onset   Diabetes Mother    Hypertension Mother    Stroke Mother    Diabetes Sister    Heart disease Sister    Kidney disease Father    Drug abuse Brother    Colon cancer Neg Hx    Colon polyps Neg Hx      Social History   Socioeconomic History   Marital status: Divorced    Spouse name: Not on file   Number of children: 2   Years of education: Not on file   Highest education level: Not on file  Occupational History   Occupation: retired from [employer]   Smoking status: Every Day    Packs/day: 2.00    Years: 48.00    Additional pack years: 0.00    Total pack years: 96.00    Types: Cigarettes   Smokeless tobacco: Never  Vaping Use   Vaping Use: Never used  Substance and Sexual Activity   Alcohol use: Yes    Alcohol/week: 0.0 standard drinks of alcohol    Comment: "little bit of beer"   Drug use: No   Sexual activity: Not Currently    Birth control/protection: None  Other Topics Concern   Not on file  Social History Narrative   ** Merged History Encounter **       ** Merged History Encounter **       Social Determinants of Health   Financial Resource Strain: Low Risk  (12/13/2022)   Overall Financial Resource Strain (CARDIA)    Difficulty of Paying Living Expenses: Not very hard  Food Insecurity: No Food Insecurity (11/27/2022)   Hunger Vital Sign    Worried About Running Out of Food in the Last Year: Never true    Ran Out of Food in the Last Year: Never true  Transportation Needs: No Transportation Needs (12/13/2022)   PRAPARE - Hydrologist (Medical): No    Lack of Transportation (Non-Medical): No  Physical Activity: Inactive (11/27/2022)   Exercise Vital Sign    Days of Exercise per Week: 0 days    Minutes of Exercise per Session: 0 min  Stress: No Stress Concern Present (11/27/2022)   [place]    Feeling of Stress : Not at all  Social Connections: Moderately Isolated (11/27/2022)   Social Connection and Isolation Panel [NHANES]    Frequency of Communication with Friends and Family: More than three times a week    Frequency of  Social Gatherings with  Friends and Family: More than three times a week    Attends Religious Services: More than 4 times per year    Active Member of [employer] or Organizations: No    Attends Archivist Meetings: Never    Marital Status: Divorced    Subjective: Review of Systems  Constitutional:  Negative for chills and fever.  HENT:  Negative for congestion and hearing loss.   Eyes:  Negative for blurred vision and double vision.  Respiratory:  Negative for cough and shortness of breath.   Cardiovascular:  Negative for chest pain and palpitations.  Gastrointestinal:  Positive for heartburn. Negative for abdominal pain, blood in stool, constipation, diarrhea, melena and vomiting.  Genitourinary:  Negative for dysuria and urgency.  Musculoskeletal:  Negative for joint pain and myalgias.  Skin:  Negative for itching and rash.  Neurological:  Negative for dizziness and headaches.  Psychiatric/Behavioral:  Negative for depression. The patient is not nervous/anxious.      Objective: BP 128/84   Pulse 75   Temp (!) 97.5 F (36.4 C)   Ht 6\' 1"  (1.854 m)   Wt 234 lb 3.2 oz (106.2 kg)   BMI 30.90 kg/m  Physical Exam Constitutional:      Appearance: Normal appearance. He is obese.  HENT:     Head: Normocephalic and atraumatic.  Eyes:     Extraocular Movements: Extraocular movements intact.     Conjunctiva/sclera: Conjunctivae normal.  Cardiovascular:     Rate and Rhythm: Normal rate and regular rhythm.  Pulmonary:     Effort: Pulmonary effort is normal.     Breath sounds: Normal breath sounds.  Abdominal:     General: Bowel sounds are normal.     Palpations: Abdomen is soft.  Musculoskeletal:        General: Normal range of motion.     Cervical back: Normal range of motion and neck supple.  Skin:    General: Skin is warm.  Neurological:     General: No focal deficit present.     Mental Status: He is alert and oriented to person, place, and time.  Psychiatric:        Mood and Affect:  Mood normal.        Behavior: Behavior normal.      Assessment/Plan:  1.  Abnormal CT of esophagus-Will schedule for EGD to evaluate for peptic ulcer disease, esophagitis, gastritis, H. Pylori, duodenitis, or other. Will also evaluate for esophageal stricture, Schatzki's ring, esophageal web or other.   The risks including infection, bleed, or perforation as well as benefits, limitations, alternatives and imponderables have been reviewed with the patient. Potential for esophageal dilation, biopsy, etc. have also been reviewed.  Questions have been answered. All parties agreeable.  2.  Chronic GERD-well-controlled pantoprazole 40 mg daily.  Will continue.  3.  Adenomatous colon polyps-recommended colonoscopy for surveillance purposes at the same time of EGD.  Patient is averse to this and would like to hold off for now.  Counseled that we could be missing polyps and/or colon cancer by not performing and he understands.  Continue to recommend.  4.  Abnormal LFTs/steatohepatitis-chronically elevated LFTs recently worsened.  Patient is drinking alcohol approximately 1 can per day.  Other risk factors for fatty liver include obesity, diabetes, dyslipidemia.  Recommend 1-2# weight loss per week until ideal body weight through exercise & diet. Low fat/cholesterol diet.   Avoid sweets, sodas, fruit juices, sweetened beverages like tea, etc. Gradually increase exercise  from 15 min daily up to 1 hr per day 5 days/week. Limit alcohol use.  Discuss further on follow-up visit.  Will need ultrasound with elastography.  Follow-up after upper endoscopy.  03/15/2023 8:46 AM   Disclaimer: This note was dictated with voice recognition software. Similar sounding words can inadvertently be transcribed and may not be corrected upon review.

## 2023-03-15 NOTE — Telephone Encounter (Signed)
Per [facility] "Notification or Prior Authorization is not required for the requested services You are not required to submit a notification/prior authorization based on the information provided. The number above acknowledges your inquiry and our response. Please write this number down and refer to it for future inquiries. If you still wish to submit your request for review, please select the Continue with Submission button below. Decision ID #: [id number]"

## 2023-03-19 ENCOUNTER — Other Ambulatory Visit: Payer: Self-pay | Admitting: Family Medicine

## 2023-03-19 ENCOUNTER — Other Ambulatory Visit: Payer: Self-pay | Admitting: Internal Medicine

## 2023-03-19 DIAGNOSIS — M25551 Pain in right hip: Secondary | ICD-10-CM

## 2023-03-19 DIAGNOSIS — J449 Chronic obstructive pulmonary disease, unspecified: Secondary | ICD-10-CM

## 2023-03-23 NOTE — Telephone Encounter (Signed)
Called pt and ask if he is willing to reschedule his EGD. He says he is not ready at this time and he will call back when he is ready. FYI

## 2023-03-23 NOTE — Progress Notes (Signed)
Patient notified of LDCT Lung Cancer Screening Results via mail with the recommendation to follow-up in 12 months. Patient's referring provider has been sent a copy of results. Results are as follows:   IMPRESSION: 1. Lung-RADS 2, benign appearance or behavior. Continue annual screening with low-dose chest CT without contrast in 12 months. 2. Stable mildly dilated ascending thoracic aorta, measuring up 4.3 cm. Recommend continued attention on annual lung cancer screening CT. 3. Severe left main and three-vessel coronary artery calcifications. 4. Aortic Atherosclerosis (ICD10-I70.0) and Emphysema (ICD10-J43.9).

## 2023-03-28 ENCOUNTER — Other Ambulatory Visit: Payer: Self-pay | Admitting: Family Medicine

## 2023-03-28 ENCOUNTER — Encounter: Payer: Self-pay | Admitting: Family Medicine

## 2023-03-28 ENCOUNTER — Ambulatory Visit (INDEPENDENT_AMBULATORY_CARE_PROVIDER_SITE_OTHER): Payer: 59 | Admitting: Family Medicine

## 2023-03-28 VITALS — BP 124/78 | HR 75 | Ht 73.0 in | Wt 234.0 lb

## 2023-03-28 DIAGNOSIS — Z720 Tobacco use: Secondary | ICD-10-CM

## 2023-03-28 DIAGNOSIS — F17210 Nicotine dependence, cigarettes, uncomplicated: Secondary | ICD-10-CM

## 2023-03-28 DIAGNOSIS — J42 Unspecified chronic bronchitis: Secondary | ICD-10-CM

## 2023-03-28 DIAGNOSIS — E785 Hyperlipidemia, unspecified: Secondary | ICD-10-CM | POA: Diagnosis not present

## 2023-03-28 DIAGNOSIS — E559 Vitamin D deficiency, unspecified: Secondary | ICD-10-CM | POA: Diagnosis not present

## 2023-03-28 DIAGNOSIS — Z23 Encounter for immunization: Secondary | ICD-10-CM

## 2023-03-28 DIAGNOSIS — E1169 Type 2 diabetes mellitus with other specified complication: Secondary | ICD-10-CM | POA: Diagnosis not present

## 2023-03-28 DIAGNOSIS — I2510 Atherosclerotic heart disease of native coronary artery without angina pectoris: Secondary | ICD-10-CM

## 2023-03-28 DIAGNOSIS — E669 Obesity, unspecified: Secondary | ICD-10-CM

## 2023-03-28 DIAGNOSIS — E119 Type 2 diabetes mellitus without complications: Secondary | ICD-10-CM

## 2023-03-28 DIAGNOSIS — I10 Essential (primary) hypertension: Secondary | ICD-10-CM | POA: Diagnosis not present

## 2023-03-28 DIAGNOSIS — M25551 Pain in right hip: Secondary | ICD-10-CM

## 2023-03-28 MED ORDER — KETOROLAC TROMETHAMINE 60 MG/2ML IM SOLN
60.0000 mg | Freq: Once | INTRAMUSCULAR | Status: AC
Start: 2023-03-28 — End: 2023-03-28
  Administered 2023-03-28: 60 mg via INTRAMUSCULAR

## 2023-03-28 MED ORDER — NICOTINE 21 MG/24HR TD PT24: 21.0000 mg | MEDICATED_PATCH | Freq: Every day | TRANSDERMAL | 3 refills | Status: DC

## 2023-03-28 MED ORDER — MONTELUKAST SODIUM 10 MG PO TABS: 10.0000 mg | ORAL_TABLET | Freq: Every day | ORAL | 3 refills | Status: DC

## 2023-03-28 MED ORDER — METHYLPREDNISOLONE ACETATE 80 MG/ML IJ SUSP
40.0000 mg | Freq: Once | INTRAMUSCULAR | Status: AC
Start: 2023-03-28 — End: 2023-03-28
  Administered 2023-03-28: 40 mg via INTRAMUSCULAR

## 2023-03-28 MED ORDER — UNABLE TO FIND
0 refills | Status: DC
Start: 2023-03-28 — End: 2023-04-26

## 2023-03-28 MED ORDER — METHYLPREDNISOLONE ACETATE 80 MG/ML IJ SUSP
80.0000 mg | Freq: Once | INTRAMUSCULAR | Status: DC
Start: 2023-03-28 — End: 2023-03-28

## 2023-03-28 NOTE — Patient Instructions (Addendum)
F/U in 3 months, call if you need me sooner  NEED to QUIT smoking  Urine ACR today  Labs today lipid, cmp and EGFR, HBA1C, TSH and vit D Nurse pls send for and document eye exam done in March, [place]  Toradol 60 mg IM and depo medrol 40 mg IM in office for right hip pain  You are referred to Cardiology  Nurse pls give rx for TDAP for him to take to pharmacy, needs vaccine  It is important that you exercise regularly at least 30 minutes 5 times a week. If you develop chest pain, have severe difficulty breathing, or feel very tired, stop exercising immediately and seek medical attention  Think about what you will eat, plan ahead. Choose " clean, green, fresh or frozen" over canned, processed or packaged foods which are more sugary, salty and fatty. 70 to 75% of food eaten should be vegetables and fruit. Three meals at set times with snacks allowed between meals, but they must be fruit or vegetables. Aim to eat over a 12 hour period , example 7 am to 7 pm, and STOP after  your last meal of the day. Drink water,generally about 64 ounces per day, no other drink is as healthy. Fruit juice is best enjoyed in a healthy way, by EATING the fruit. Thanks for choosing [facility], we consider it a privelige to serve you.

## 2023-03-30 ENCOUNTER — Other Ambulatory Visit: Payer: Self-pay | Admitting: Nurse Practitioner

## 2023-03-30 LAB — CMP14+EGFR
ALT: 28 IU/L (ref 0–44)
AST: 22 IU/L (ref 0–40)
Albumin/Globulin Ratio: 1.4 (ref 1.2–2.2)
Albumin: 4.3 g/dL (ref 3.8–4.8)
Alkaline Phosphatase: 41 IU/L — ABNORMAL LOW (ref 44–121)
BUN/Creatinine Ratio: 13 (ref 10–24)
BUN: 23 mg/dL (ref 8–27)
Bilirubin Total: 0.3 mg/dL (ref 0.0–1.2)
CO2: 25 mmol/L (ref 20–29)
Calcium: 10.4 mg/dL — ABNORMAL HIGH (ref 8.6–10.2)
Chloride: 101 mmol/L (ref 96–106)
Creatinine, Ser: 1.8 mg/dL — ABNORMAL HIGH (ref 0.76–1.27)
Globulin, Total: 3.1 g/dL (ref 1.5–4.5)
Glucose: 145 mg/dL — ABNORMAL HIGH (ref 70–99)
Potassium: 4.1 mmol/L (ref 3.5–5.2)
Sodium: 140 mmol/L (ref 134–144)
Total Protein: 7.4 g/dL (ref 6.0–8.5)
eGFR: 39 mL/min/{1.73_m2} — ABNORMAL LOW (ref >59)

## 2023-03-30 LAB — TSH: TSH: 1.15 u[IU]/mL (ref 0.450–4.500)

## 2023-03-30 LAB — LIPID PANEL
Chol/HDL Ratio: 4.4 ratio (ref 0.0–5.0)
Cholesterol, Total: 146 mg/dL (ref 100–199)
HDL: 33 mg/dL — ABNORMAL LOW (ref >39)
LDL Chol Calc (NIH): 87 mg/dL (ref 0–99)
Triglycerides: 147 mg/dL (ref 0–149)
VLDL Cholesterol Cal: 26 mg/dL (ref 5–40)

## 2023-03-30 LAB — MICROALBUMIN / CREATININE URINE RATIO
Creatinine, Urine: 62 mg/dL
Microalb/Creat Ratio: 15 mg/g creat (ref 0–29)
Microalbumin, Urine: 9 ug/mL

## 2023-03-30 LAB — HEMOGLOBIN A1C
Est. average glucose Bld gHb Est-mCnc: 157 mg/dL
Hgb A1c MFr Bld: 7.1 % — ABNORMAL HIGH (ref 4.8–5.6)

## 2023-03-30 LAB — VITAMIN D 25 HYDROXY (VIT D DEFICIENCY, FRACTURES): Vit D, 25-Hydroxy: 39.8 ng/mL (ref 30.0–100.0)

## 2023-04-02 DIAGNOSIS — J449 Chronic obstructive pulmonary disease, unspecified: Secondary | ICD-10-CM | POA: Diagnosis not present

## 2023-04-04 NOTE — Assessment & Plan Note (Signed)
Currently stable no troubling cough or wheeze progressing however due to ongoing smoking

## 2023-04-04 NOTE — Assessment & Plan Note (Signed)
Asked:confirms currently smokes cigarettes ?Assess: Unwilling to set a quit date, but is cutting back and wants to quit ?Advise: needs to QUIT to reduce risk of cancer, cardio and cerebrovascular disease ?Assist: counseled for 5 minutes and literature provided ?Arrange: follow up in 2 to 4 months ? ?

## 2023-04-04 NOTE — Assessment & Plan Note (Signed)
Hyperlipidemia:Low fat diet discussed and encouraged.   Lipid Panel  Lab Results  Component Value Date   CHOL 146 03/28/2023   HDL 33 (L) 03/28/2023   LDLCALC 87 03/28/2023   TRIG 147 03/28/2023   CHOLHDL 4.4 03/28/2023     No med change, increase exercise

## 2023-04-04 NOTE — Assessment & Plan Note (Signed)
Controlled, no change in medication DASH diet and commitment to daily physical activity for a minimum of 30 minutes discussed and encouraged, as a part of hypertension management. The importance of attaining a healthy weight is also discussed.     03/28/2023   10:00 AM 03/15/2023    8:33 AM 12/15/2022    4:15 PM 12/09/2022    9:12 AM 12/09/2022    8:00 AM 12/09/2022    7:00 AM 12/08/2022    8:00 PM  BP/Weight  Systolic BP 124 128 121 168 151 155 162  Diastolic BP 78 84 77 101 91 138 69  Wt. (Lbs) 234.04 234.2 222      BMI 30.88 kg/m2 30.9 kg/m2 29.29 kg/m2

## 2023-04-04 NOTE — Assessment & Plan Note (Addendum)
1 week h/o increased pain, toradol 60 g and depo medrol 40 mg IM in office

## 2023-04-04 NOTE — Progress Notes (Signed)
   ARLEN DUPUIS     MRN: [id number]      DOB: 01/10/1951   HPI Mr. Yielding is here for follow up and re-evaluation of chronic medical conditions, medication management and review of any available recent lab and radiology data.  Preventive health is updated, specifically  Cancer screening and Immunization.   Questions or concerns regarding consultations or procedures which the PT has had in the interim are  addressed. The PT denies any adverse reactions to current medications since the last visit.  C/o increased right hip pain x 1 week, no inciting trauma C/o excess flatulence Denies polyuria, polydipsia, blurred vision , or hypoglycemic episodes. ROS Denies recent fever or chills. Denies sinus pressure, nasal congestion, ear pain or sore throat. Denies chest congestion, productive cough or wheezing. Denies chest pains, palpitations and leg swelling Denies abdominal pain, nausea, vomiting,diarrhea or constipation.   Denies dysuria, frequency, hesitancy or incontinence. Denies joint pain, swelling and limitation in mobility. Denies headaches, seizures, numbness, or tingling. Denies depression, anxiety or insomnia. Denies skin break down or rash.   PE  BP 124/78 (BP Location: Right Arm, Patient Position: Sitting, Cuff Size: Normal)   Pulse 75   Ht  (1.854 m)   Wt 234 lb 0.6 oz (106.2 kg)   SpO2 96%   BMI 30.88 kg/m   Patient alert and oriented and in no cardiopulmonary distress.  HEENT: No facial asymmetry, EOMI,     Neck supple .  Chest: Clear to auscultation bilaterally.  CVS: S1, S2 no murmurs, no S3.Regular rate.  ABD: Soft non tender.   Ext: No edema  MS: Adequate ROM spine, shoulders, hips and knees.  Skin: Intact, no ulcerations or rash noted.  Psych: Good eye contact, normal affect. Memory intact not anxious or depressed appearing.  CNS: CN 2-12 intact, power,  normal throughout.no focal deficits noted.   Assessment & Plan  No problem-specific  Assessment & Plan notes found for this encounter.

## 2023-04-04 NOTE — Assessment & Plan Note (Signed)
Multiple risk factors , refer card for re eval

## 2023-04-04 NOTE — Assessment & Plan Note (Signed)
Not as well controled, no med change ,needs to change food choice and portion control It is important that you exercise regularly at least 30 minutes 5 times a week. If you develop chest pain, have severe difficulty breathing, or feel very tired, stop exercising immediately and seek medical attention  Updated lab needed at/ before next visit.

## 2023-04-16 ENCOUNTER — Encounter: Payer: Self-pay | Admitting: Family Medicine

## 2023-04-16 ENCOUNTER — Other Ambulatory Visit: Payer: Self-pay | Admitting: Internal Medicine

## 2023-04-16 ENCOUNTER — Other Ambulatory Visit: Payer: Self-pay | Admitting: Family Medicine

## 2023-04-16 DIAGNOSIS — F209 Schizophrenia, unspecified: Secondary | ICD-10-CM

## 2023-04-16 DIAGNOSIS — J449 Chronic obstructive pulmonary disease, unspecified: Secondary | ICD-10-CM

## 2023-04-16 DIAGNOSIS — M25552 Pain in left hip: Secondary | ICD-10-CM

## 2023-04-18 ENCOUNTER — Other Ambulatory Visit: Payer: Medicare Other

## 2023-04-18 ENCOUNTER — Inpatient Hospital Stay: Payer: 59 | Attending: Hematology

## 2023-04-18 ENCOUNTER — Other Ambulatory Visit: Payer: Self-pay

## 2023-04-18 DIAGNOSIS — N1832 Chronic kidney disease, stage 3b: Secondary | ICD-10-CM | POA: Insufficient documentation

## 2023-04-18 DIAGNOSIS — D472 Monoclonal gammopathy: Secondary | ICD-10-CM

## 2023-04-18 DIAGNOSIS — F17210 Nicotine dependence, cigarettes, uncomplicated: Secondary | ICD-10-CM | POA: Diagnosis not present

## 2023-04-18 DIAGNOSIS — I129 Hypertensive chronic kidney disease with stage 1 through stage 4 chronic kidney disease, or unspecified chronic kidney disease: Secondary | ICD-10-CM | POA: Insufficient documentation

## 2023-04-18 DIAGNOSIS — E1122 Type 2 diabetes mellitus with diabetic chronic kidney disease: Secondary | ICD-10-CM | POA: Insufficient documentation

## 2023-04-18 DIAGNOSIS — C9000 Multiple myeloma not having achieved remission: Secondary | ICD-10-CM | POA: Diagnosis not present

## 2023-04-18 LAB — COMPREHENSIVE METABOLIC PANEL
ALT: 29 U/L (ref 0–44)
AST: 24 U/L (ref 15–41)
Albumin: 4 g/dL (ref 3.5–5.0)
Alkaline Phosphatase: 36 U/L — ABNORMAL LOW (ref 38–126)
Anion gap: 9 (ref 5–15)
BUN: 21 mg/dL (ref 8–23)
CO2: 24 mmol/L (ref 22–32)
Calcium: 10.1 mg/dL (ref 8.9–10.3)
Chloride: 102 mmol/L (ref 98–111)
Creatinine, Ser: 1.7 mg/dL — ABNORMAL HIGH (ref 0.61–1.24)
GFR, Estimated: 42 mL/min — ABNORMAL LOW (ref >60)
Glucose, Bld: 176 mg/dL — ABNORMAL HIGH (ref 70–99)
Potassium: 3.6 mmol/L (ref 3.5–5.1)
Sodium: 135 mmol/L (ref 135–145)
Total Bilirubin: 0.6 mg/dL (ref 0.3–1.2)
Total Protein: 7.6 g/dL (ref 6.5–8.1)

## 2023-04-18 LAB — CBC WITH DIFFERENTIAL/PLATELET
Abs Immature Granulocytes: 0.02 10*3/uL (ref 0.00–0.07)
Basophils Absolute: 0 10*3/uL (ref 0.0–0.1)
Basophils Relative: 1 %
Eosinophils Absolute: 0.3 10*3/uL (ref 0.0–0.5)
Eosinophils Relative: 5 %
HCT: 41.1 % (ref 39.0–52.0)
Hemoglobin: 13.2 g/dL (ref 13.0–17.0)
Immature Granulocytes: 0 %
Lymphocytes Relative: 29 %
Lymphs Abs: 1.7 10*3/uL (ref 0.7–4.0)
MCH: 29.1 pg (ref 26.0–34.0)
MCHC: 32.1 g/dL (ref 30.0–36.0)
MCV: 90.5 fL (ref 80.0–100.0)
Monocytes Absolute: 0.6 10*3/uL (ref 0.1–1.0)
Monocytes Relative: 10 %
Neutro Abs: 3.1 10*3/uL (ref 1.7–7.7)
Neutrophils Relative %: 55 %
Platelets: 192 10*3/uL (ref 150–400)
RBC: 4.54 MIL/uL (ref 4.22–5.81)
RDW: 14.3 % (ref 11.5–15.5)
WBC: 5.7 10*3/uL (ref 4.0–10.5)
nRBC: 0 % (ref 0.0–0.2)

## 2023-04-18 LAB — LACTATE DEHYDROGENASE: LDH: 122 U/L (ref 98–192)

## 2023-04-19 ENCOUNTER — Other Ambulatory Visit (HOSPITAL_COMMUNITY): Payer: 59

## 2023-04-19 LAB — KAPPA/LAMBDA LIGHT CHAINS
Kappa free light chain: 30.1 mg/L — ABNORMAL HIGH (ref 3.3–19.4)
Kappa, lambda light chain ratio: 1.09 (ref 0.26–1.65)
Lambda free light chains: 27.6 mg/L — ABNORMAL HIGH (ref 5.7–26.3)

## 2023-04-20 LAB — PROTEIN ELECTROPHORESIS, SERUM
A/G Ratio: 1.1 (ref 0.7–1.7)
Albumin ELP: 3.6 g/dL (ref 2.9–4.4)
Alpha-1-Globulin: 0.3 g/dL (ref 0.0–0.4)
Alpha-2-Globulin: 0.9 g/dL (ref 0.4–1.0)
Beta Globulin: 0.9 g/dL (ref 0.7–1.3)
Gamma Globulin: 1.2 g/dL (ref 0.4–1.8)
Globulin, Total: 3.3 g/dL (ref 2.2–3.9)
M-Spike, %: 0.7 g/dL — ABNORMAL HIGH
Total Protein ELP: 6.9 g/dL (ref 6.0–8.5)

## 2023-04-22 ENCOUNTER — Emergency Department (HOSPITAL_COMMUNITY): Payer: 59

## 2023-04-22 ENCOUNTER — Emergency Department (HOSPITAL_COMMUNITY)
Admission: EM | Admit: 2023-04-22 | Discharge: 2023-04-22 | Disposition: A | Discharge status: Home / Self Care | Payer: 59 | Attending: Emergency Medicine | Admitting: Emergency Medicine

## 2023-04-22 DIAGNOSIS — R1013 Epigastric pain: Secondary | ICD-10-CM | POA: Diagnosis present

## 2023-04-22 DIAGNOSIS — E1122 Type 2 diabetes mellitus with diabetic chronic kidney disease: Secondary | ICD-10-CM | POA: Diagnosis not present

## 2023-04-22 DIAGNOSIS — N183 Chronic kidney disease, stage 3 unspecified: Secondary | ICD-10-CM | POA: Diagnosis not present

## 2023-04-22 DIAGNOSIS — I10 Essential (primary) hypertension: Secondary | ICD-10-CM | POA: Diagnosis not present

## 2023-04-22 DIAGNOSIS — Z7982 Long term (current) use of aspirin: Secondary | ICD-10-CM | POA: Diagnosis not present

## 2023-04-22 DIAGNOSIS — Z79899 Other long term (current) drug therapy: Secondary | ICD-10-CM | POA: Insufficient documentation

## 2023-04-22 DIAGNOSIS — R140 Abdominal distension (gaseous): Secondary | ICD-10-CM | POA: Diagnosis not present

## 2023-04-22 DIAGNOSIS — I700 Atherosclerosis of aorta: Secondary | ICD-10-CM | POA: Diagnosis not present

## 2023-04-22 DIAGNOSIS — K2980 Duodenitis without bleeding: Secondary | ICD-10-CM | POA: Diagnosis not present

## 2023-04-22 DIAGNOSIS — I129 Hypertensive chronic kidney disease with stage 1 through stage 4 chronic kidney disease, or unspecified chronic kidney disease: Secondary | ICD-10-CM | POA: Insufficient documentation

## 2023-04-22 DIAGNOSIS — Z743 Need for continuous supervision: Secondary | ICD-10-CM | POA: Diagnosis not present

## 2023-04-22 DIAGNOSIS — J449 Chronic obstructive pulmonary disease, unspecified: Secondary | ICD-10-CM | POA: Diagnosis not present

## 2023-04-22 DIAGNOSIS — R109 Unspecified abdominal pain: Secondary | ICD-10-CM | POA: Diagnosis not present

## 2023-04-22 DIAGNOSIS — K859 Acute pancreatitis without necrosis or infection, unspecified: Secondary | ICD-10-CM

## 2023-04-22 LAB — COMPREHENSIVE METABOLIC PANEL
ALT: 26 U/L (ref 0–44)
AST: 20 U/L (ref 15–41)
Albumin: 3.9 g/dL (ref 3.5–5.0)
Alkaline Phosphatase: 30 U/L — ABNORMAL LOW (ref 38–126)
Anion gap: 9 (ref 5–15)
BUN: 22 mg/dL (ref 8–23)
CO2: 26 mmol/L (ref 22–32)
Calcium: 10.4 mg/dL — ABNORMAL HIGH (ref 8.9–10.3)
Chloride: 98 mmol/L (ref 98–111)
Creatinine, Ser: 1.53 mg/dL — ABNORMAL HIGH (ref 0.61–1.24)
GFR, Estimated: 48 mL/min — ABNORMAL LOW (ref >60)
Glucose, Bld: 142 mg/dL — ABNORMAL HIGH (ref 70–99)
Potassium: 3.5 mmol/L (ref 3.5–5.1)
Sodium: 133 mmol/L — ABNORMAL LOW (ref 135–145)
Total Bilirubin: 0.8 mg/dL (ref 0.3–1.2)
Total Protein: 7.5 g/dL (ref 6.5–8.1)

## 2023-04-22 LAB — URINALYSIS, ROUTINE W REFLEX MICROSCOPIC
Bacteria, UA: NONE SEEN
Bilirubin Urine: NEGATIVE
Glucose, UA: >=500 mg/dL — AB
Hgb urine dipstick: NEGATIVE
Ketones, ur: NEGATIVE mg/dL
Leukocytes,Ua: NEGATIVE
Nitrite: NEGATIVE
Protein, ur: NEGATIVE mg/dL
Specific Gravity, Urine: 1.012 (ref 1.005–1.030)
pH: 7 (ref 5.0–8.0)

## 2023-04-22 LAB — CBC WITH DIFFERENTIAL/PLATELET
Abs Immature Granulocytes: 0.02 10*3/uL (ref 0.00–0.07)
Basophils Absolute: 0 10*3/uL (ref 0.0–0.1)
Basophils Relative: 0 %
Eosinophils Absolute: 0.2 10*3/uL (ref 0.0–0.5)
Eosinophils Relative: 3 %
HCT: 43.2 % (ref 39.0–52.0)
Hemoglobin: 13.7 g/dL (ref 13.0–17.0)
Immature Granulocytes: 0 %
Lymphocytes Relative: 20 %
Lymphs Abs: 1.7 10*3/uL (ref 0.7–4.0)
MCH: 29 pg (ref 26.0–34.0)
MCHC: 31.7 g/dL (ref 30.0–36.0)
MCV: 91.5 fL (ref 80.0–100.0)
Monocytes Absolute: 0.7 10*3/uL (ref 0.1–1.0)
Monocytes Relative: 8 %
Neutro Abs: 6 10*3/uL (ref 1.7–7.7)
Neutrophils Relative %: 69 %
Platelets: 197 10*3/uL (ref 150–400)
RBC: 4.72 MIL/uL (ref 4.22–5.81)
RDW: 14.1 % (ref 11.5–15.5)
WBC: 8.7 10*3/uL (ref 4.0–10.5)
nRBC: 0 % (ref 0.0–0.2)

## 2023-04-22 LAB — LACTIC ACID, PLASMA
Lactic Acid, Venous: 1.3 mmol/L (ref 0.5–1.9)
Lactic Acid, Venous: 1.8 mmol/L (ref 0.5–1.9)

## 2023-04-22 LAB — LIPASE, BLOOD: Lipase: 124 U/L — ABNORMAL HIGH (ref 11–51)

## 2023-04-22 MED ORDER — ONDANSETRON 4 MG PO TBDP: 4.0000 mg | ORAL_TABLET | Freq: Three times a day (TID) | ORAL | 0 refills | Status: DC | PRN

## 2023-04-22 MED ORDER — IOHEXOL 300 MG/ML  SOLN
80.0000 mL | Freq: Once | INTRAMUSCULAR | Status: AC | PRN
  Administered 2023-04-22: 80 mL via INTRAVENOUS

## 2023-04-22 MED ORDER — MORPHINE SULFATE (PF) 4 MG/ML IV SOLN
4.0000 mg | Freq: Once | INTRAVENOUS | Status: AC
  Administered 2023-04-22: 4 mg via INTRAVENOUS
  Filled 2023-04-22: qty 1

## 2023-04-22 NOTE — Discharge Instructions (Signed)
Thank you for coming to [facility]. You were seen for abdominal pain. We did an exam, labs, and imaging, and these showed inflammation of the pancreas and duodenum (a portion of your intestine).  Please use a liquid diet at home, drinking water Gatorade or broth, for a couple of days to rest your pancreas and allow it to heal.  After a couple of days you can start small nonfatty meals and then slowly resume a normal diet.  Please follow up with your gastroenterologist (GI doctor) in 1-2 weeks or as soon as possible for your endoscopy (camera down the throat to look at your esophagus/stomach/intestine). This is very important as it is possible you have cancer that needs to be treated.   We have prescribed Zofran to use 4 mg under the tongue as needed every 8 hours for nausea.  Please stay very well-hydrated at home with liquids.  Do not hesitate to return to the ED or call 911 if you experience: -Worsening symptoms -Nausea/vomiting so severe you cannot eat or drink anything -Worsening abdominal pain -You cannot have a bowel movement -Lightheadedness, passing out -Fevers/chills -Anything else that concerns you

## 2023-04-22 NOTE — ED Triage Notes (Signed)
Patient BIB by EMS from home for epigastric pain x 2-3 days and nausea, denies vomiting and diarrhea.

## 2023-04-22 NOTE — ED Provider Notes (Signed)
[facility] AT [facility] Provider Note   CSN: [id number] Arrival date & time: 04/22/23  1543     History  Chief Complaint  Patient presents with   Abdominal Pain    Matthew Molina is a 72 y.o. male with HTN, COPD, GERD, CKD stage III, T2DM, coronary atherosclerosis, chronic respiratory failure with hypoxia, chronic alcohol use, tubular adenoma of colon, incomplete emptying of bladder, tobacco abuse, diverticulosis, schizophrenia, GAD who presents with abd pain.   Patient BIB by EMS from home for epigastric/central/suprapubic abdominal pain x 2-3 days and nausea a/w vomiting and decreased PO intake. Currently rated 3/10. Pain is nonradiating, does not notice any aggravating or alleviating factors.  Denies urinary symptoms, f/c. States he had similar symptoms once before and they said it was due to constipation.  He has not exactly remember when his last bowel movement was but thinks it was 2 days ago.  No abdominal surgical history.  Denies any chest pain or shortness of breath.  He states he does drink alcohol approximately 1 can of beer per day.  Per chart review he was seen by GI on 03/15/2023 after CT chest demonstrated distal esophageal wall thickening with recommendations for upper endoscopy to rule out malignancy, plan for outpatient EGD.  Last colonoscopy was in 2021 which found 7 total colon polyps, diverticulosis, and internal/external hemorrhoids.  Recommended repeat colonoscopy in 2024.  History of hepatosteatosis in setting of chronic alcohol use with chronically elevated LFTs recently uptrending.   Abdominal Pain      Home Medications Prior to Admission medications   Medication Sig Start Date End Date Taking? Authorizing Provider  acetaminophen (TYLENOL) 500 MG tablet Take 500-1,000 mg by mouth as needed for headache. Takes mostly for HA about 2 x/week.   Yes [provider]  albuterol (VENTOLIN HFA) 108 (90 Base) MCG/ACT inhaler  INHALE 2 PUFFS INTO THE LUNGS EVERY 6 HOURS AS NEEDED FOR WHEEZING OR SHORTNESS OF BREATH. 04/16/23  Yes Kerri Perches, MD  aspirin 81 MG EC tablet Take 81 mg by mouth daily. Swallow whole.   Yes [provider]  busPIRone (BUSPAR) 7.5 MG tablet TAKE (1) TABLET BY MOUTH THREE TIMES A DAY. Patient taking differently: Take 7.5 mg by mouth 3 (three) times daily. 03/19/23  Yes Kerri Perches, MD  carvedilol (COREG) 6.25 MG tablet Take 6.25 mg by mouth 2 (two) times daily with a meal.   Yes [provider]  docusate sodium (COLACE) 100 MG capsule Take 100 mg by mouth daily as needed for mild constipation.    Yes [provider]  FARXIGA 5 MG TABS tablet Take 5 mg by mouth every morning. 07/27/22  Yes [provider]  glipiZIDE (GLUCOTROL XL) 10 MG 24 hr tablet TAKE (2) TABLETS BY MOUTH DAILY EVERY MORNING AT BREAKFAST. Patient taking differently: Take 10 mg by mouth daily with breakfast. 04/16/23  Yes Kerri Perches, MD  ipratropium-albuterol (DUONEB) 0.5-2.5 (3) MG/3ML SOLN INHALE 1 VIAL VIA NEBULIZER EVERY 4 TO 6 HOURS. 02/19/23  Yes Kerri Perches, MD  meloxicam (MOBIC) 7.5 MG tablet TAKE 1 TABLET BY MOUTH ONCE DAILY. 04/16/23  Yes Kerri Perches, MD  montelukast (SINGULAIR) 10 MG tablet Take 1 tablet (10 mg total) by mouth at bedtime. 03/28/23  Yes Kerri Perches, MD  olmesartan (BENICAR) 40 MG tablet TAKE ONE TABLET BY MOUTH DAILY 03/29/23  Yes Kerri Perches, MD  ondansetron (ZOFRAN-ODT) 4 MG disintegrating tablet Take 1  tablet (4 mg total) by mouth every 8 (eight) hours as needed for nausea or vomiting. 04/22/23  Yes Loetta Rough, MD  pantoprazole (PROTONIX) 40 MG tablet TAKE ONE TABLET BY MOUTH ONCE DAILY. 01/16/23  Yes Tiffany Kocher, PA-C  risperiDONE (RISPERDAL) 1 MG tablet TAKE ONE TABLET BY MOUTH IN THE EVENING. TAKE WITH 4MG  TABLET. Patient taking differently: Take 1 mg by mouth every evening. Take with 4 mg tablet 03/19/23  Yes  Kerri Perches, MD  risperidone (RISPERDAL) 4 MG tablet TAKE ONE TABLET BY MOUTH IN THE EVENING. TAKE WITH 1MG  TABLET. 04/16/23  Yes Kerri Perches, MD  rosuvastatin (CRESTOR) 10 MG tablet TAKE ONE TABLET BY MOUTH ONCE DAILY. 04/16/23  Yes Pricilla Riffle, MD  SYMBICORT 160-4.5 MCG/ACT inhaler INHALE 2 PUFFS INTO THE LUNGS ONCE IN THE MORNING AND AT BEDTIME. Patient taking differently: Inhale 2 puffs into the lungs 2 (two) times daily. 04/16/23  Yes Kerri Perches, MD  tamsulosin (FLOMAX) 0.4 MG CAPS capsule Take 1 capsule (0.4 mg total) by mouth daily after supper. 06/26/22  Yes McKenzie, Mardene Celeste, MD  traZODone (DESYREL) 50 MG tablet TAKE ONE TABLET BY MOUTH AT BEDTIME AS NEEDED FOR SLEEP. 04/16/23  Yes Kerri Perches, MD  triamterene-hydrochlorothiazide (MAXZIDE-25) 37.5-25 MG tablet TAKE (1/2) TABLET BY MOUTH ONCE DAILY. Patient taking differently: Take 1 tablet by mouth See admin instructions. TAKE (1/2) TABLET BY MOUTH ONCE DAILY. 02/12/23  Yes Kerri Perches, MD  ACCU-CHEK GUIDE test strip USE AS DIRECTED TO CHECK BLOOD SUGAR ONCE DAILY. 11/18/21   Kerri Perches, MD  ACCU-CHEK GUIDE test strip USE TO CHECK BLOOD SUGAR ONCE DAILY 04/02/23   Kerri Perches, MD  Accu-Chek Softclix Lancets lancets TEST BLOOD SUGAR ONCE DAILY AS DIRECTED. 07/07/19   Kerri Perches, MD  fenofibrate (TRICOR) 145 MG tablet TAKE 1 TABLET BY MOUTH DAILY. STOP TAKING EZETIMIBE. Patient taking differently: Take 145 mg by mouth daily. 04/16/23   Kerri Perches, MD  Lancets 30G MISC Once daily testing dx e11.9 11/25/21   Donell Beers, FNP  nicotine (NICODERM CQ - DOSED IN MG/24 HOURS) 14 mg/24hr patch Place 1 patch (14 mg total) onto the skin daily. Patient not taking: Reported on 03/15/2023 12/04/22   Kerri Perches, MD  nicotine (NICODERM CQ) 21 mg/24hr patch Place 1 patch (21 mg total) onto the skin daily. Patient not taking: Reported on 04/22/2023 03/28/23   Kerri Perches, MD  sucralfate (CARAFATE) 1 g tablet TAKE 1 TABLET BY MOUTH FOUR TIMES A DAY AS NEEDED. Patient not taking: Reported on 03/15/2023 07/14/21   Anabel Halon, MD  UNABLE TO FIND Med Name: TDAP VACCINE 03/28/23   Kerri Perches, MD  vitamin B-12 (CYANOCOBALAMIN) 1000 MCG tablet Take 1,000 mcg by mouth daily.    [provider]  sildenafil (VIAGRA) 100 MG tablet Take 100 mg by mouth. Take one tablet 30 mins before intercourse   03/11/12  [provider]      Allergies    Sertraline hcl and Wellbutrin [bupropion]    Review of Systems   Review of Systems  Gastrointestinal:  Positive for abdominal pain.   Review of systems Negative for f/c.  A 10 point review of systems was performed and is negative unless otherwise reported in HPI.  Physical Exam Updated Vital Signs BP (!) 128/90   Pulse 89   Temp 98.4 F (36.9 C)   Resp (!) 24   SpO2  93%  Physical Exam General: Normal appearing male, lying in bed.  HEENT: Sclera anicteric, MMM, trachea midline.  Cardiology: RRR, no murmurs/rubs/gallops. BL radial and DP pulses equal bilaterally.  Resp: Normal respiratory rate and effort. CTAB, no wheezes, rhonchi, crackles.  Abd: Mild TTP in epigastric, periumbilical, and suprapubic region. Non-distended. No rebound tenderness or guarding.  GU: Deferred. MSK: No peripheral edema or signs of trauma. Extremities without deformity or TTP. No cyanosis or clubbing. Skin: warm, dry. No rashes or lesions. Back: No CVA tenderness Neuro: A&Ox4, CNs II-XII grossly intact. MAEs. Sensation grossly intact.  Psych: Normal mood and affect.   ED Results / Procedures / Treatments   Labs (all labs ordered are listed, but only abnormal results are displayed) Labs Reviewed  COMPREHENSIVE METABOLIC PANEL - Abnormal; Notable for the following components:      Result Value   Sodium 133 (*)    Glucose, Bld 142 (*)    Creatinine, Ser 1.53 (*)    Calcium 10.4 (*)    Alkaline Phosphatase 30  (*)    GFR, Estimated 48 (*)    All other components within normal limits  LIPASE, BLOOD - Abnormal; Notable for the following components:   Lipase 124 (*)    All other components within normal limits  URINALYSIS, ROUTINE W REFLEX MICROSCOPIC - Abnormal; Notable for the following components:   Glucose, UA >=500 (*)    All other components within normal limits  CBC WITH DIFFERENTIAL/PLATELET  LACTIC ACID, PLASMA  LACTIC ACID, PLASMA    EKG EKG Interpretation  Date/Time:  Sunday Apr 22 2023 16:25:14 EDT Ventricular Rate:  90 PR Interval:  182 QRS Duration: 97 QT Interval:  378 QTC Calculation: 463 R Axis:   -68 Text Interpretation: Sinus rhythm Left anterior fascicular block RSR' in V1 or V2, probably normal variant Left ventricular hypertrophy Anterior Q waves, possibly due to LVH Baseline wander in lead(s) V5 Confirmed by Vivi Barrack [phone]) on 04/22/2023 5:09:32 PM  Radiology CT ABDOMEN PELVIS W CONTRAST  Result Date: 04/22/2023 CLINICAL DATA:  Abdominal pain, acute, nonlocalized EXAM: CT ABDOMEN AND PELVIS WITH CONTRAST TECHNIQUE: Multidetector CT imaging of the abdomen and pelvis was performed using the standard protocol following bolus administration of intravenous contrast. RADIATION DOSE REDUCTION: This exam was performed according to the departmental dose-optimization program which includes automated exposure control, adjustment of the mA and/or kV according to patient size and/or use of iterative reconstruction technique. CONTRAST:  80mL OMNIPAQUE IOHEXOL 300 MG/ML  SOLN COMPARISON:  March 10, 2020 FINDINGS: Lower chest: Three-vessel coronary artery atherosclerotic calcifications. Trace bilateral pleural effusions. Hepatobiliary: Cholelithiasis. No intrahepatic or extrahepatic biliary ductal dilation. No suspicious focal hepatic lesion. Pancreas: No dilation of the pancreatic duct. There is fat stranding adjacent to the uncinate process. Spleen: There are 2 subcentimeter hypodense  masses, possibly splenic cysts or hemangiomas. Adrenals/Urinary Tract: Stable appearance of the adrenal glands. Kidneys enhance symmetrically. No hydronephrosis. No obstructing nephrolithiasis. Bladder is unremarkable. Innumerable bilateral hypodense renal masses the majority of which are benign simple (for which no dedicated imaging follow-up is recommended). Multiple of these are too small to accurately characterize. Stomach/Bowel: There is circumferential wall thickening and mottled enhancement of the proximal duodenum. There is adjacent fat stranding. No evidence of bowel obstruction. Pancolonic diverticulosis without evidence of acute diverticulitis. Appendix is normal. Tiny hiatal hernia. Circumferential wall thickening of the distal esophagus. Vascular/Lymphatic: Atherosclerotic calcifications of the nonaneurysmal abdominal aorta. No suspicious lymphadenopathy. Reproductive: Prostatomegaly. Other: Small fat containing periumbilical hernia. Musculoskeletal: No acute  osseous abnormality. IMPRESSION: 1. There is circumferential wall thickening and mottled enhancement of the proximal duodenum with adjacent fat stranding. Differential considerations include duodenitis or groove pancreatitis. Recommend correlation with serum lipase levels as well as endoscopy after resolution of acute symptomatology to exclude underlying neoplasm. 2. Circumferential wall thickening of the distal esophagus, which can be seen in the setting of esophagitis. 3. Trace bilateral pleural effusions. Aortic Atherosclerosis (ICD10-I70.0). Electronically Signed   By: Meda Klinefelter M.D.   On: 04/22/2023 18:10    Procedures Procedures    Medications Ordered in ED Medications  morphine (PF) 4 MG/ML injection 4 mg (4 mg Intravenous Given 04/22/23 1719)  iohexol (OMNIPAQUE) 300 MG/ML solution 80 mL (80 mLs Intravenous Contrast Given 04/22/23 1735)    ED Course/ Medical Decision Making/ A&P                          Medical Decision  Making Amount and/or Complexity of Data Reviewed Labs: ordered. Decision-making details documented in ED Course. Radiology: ordered. Decision-making details documented in ED Course.  Risk Prescription drug management.    This patient presents to the ED for concern of abdominal pain, N/V; this involves an extensive number of treatment options, and is a complaint that carries with it a high risk of complications and morbidity.  I considered the following differential and admission for this acute, potentially life threatening condition.   MDM:    For DDX for abdominal pain includes but is not limited to:  Abdominal exam without peritoneal signs. No evidence of acute abdomen at this time.   Low suspicion for acute hepatobiliary disease (including acute cholecystitis or cholangitis), acute pancreatitis (neg lipase), PUD (including gastric perforation), acute appendicitis, vascular catastrophe such as mesenteric ischemia or AAS, bowel obstruction, viscus perforation, diverticulitis. Lower c/f pyelonephritis or cystitis w/ no urinary symptoms or flank pain.    Clinical Course as of 04/22/23 1951  Sun Apr 22, 2023  1653 Urinalysis, Routine w reflex microscopic -Urine, Clean Catch(!) Neg for UTI, just glucosuria [HN]  1708 Lactic Acid, Venous: 1.3 [HN]  1708 WBC: 8.7 No leukocytosis [HN]  1709 Hemoglobin: 13.7 [HN]  1711 Creatinine(!): 1.53 C/w baseline [HN]  1809 Lipase(!): 124 [HN]  1823 CT ABDOMEN PELVIS W CONTRAST 1. There is circumferential wall thickening and mottled enhancement of the proximal duodenum with adjacent fat stranding. Differential considerations include duodenitis or groove pancreatitis. Recommend correlation with serum lipase levels as well as endoscopy after resolution of acute symptomatology to exclude underlying neoplasm. 2. Circumferential wall thickening of the distal esophagus, which can be seen in the setting of esophagitis. 3. Trace bilateral pleural  effusions.   [HN]  1823 Patient does have epigastric TTP, lipase elevated to 124. This is not quite >3x upper limit of normal but in s/o +epigastric TTP and imaging findings indicating duodenitis vs groove pancreatitis, will presume patient w/ pancreatitis as well. Does have distal wall thickening of esophagus which was demonstrated on prior scan and patient has plans for o/p EGD. Patient already takes pantoprazole at home qd.  [HN]  1947 D/w patient his results. Likely w/ pancreatitis, recommended liquid diet and nausea/pain control at home with resuming small non-fatty meals in a few days.  I recommended he schedule his endoscopy for as soon as possible to evaluate the esophageal thickening and duodenitis. He states that "he just needs a little more time." When I ask him why he needs more time, he repeats that he just needs  more time. I relay that cancer is a possibility and patient reports understanding of that, states his GI told him too, and that the only way to evaluate for cancer is with endoscopy/biopsies. Patient reports understanding.  Instructed to f/u with GI for endoscopy or pCP if he cannot get into GI in the next few weeks. DC w/ discharge instructions/return precautions. All questions answered to patient's satisfaction.   [HN]    Clinical Course User Index [HN] Loetta Rough, MD    Labs: I Ordered, and personally interpreted labs.  The pertinent results include:  those listed above  Imaging Studies ordered: I ordered imaging studies including CT abd pelvis I independently visualized and interpreted imaging. I agree with the radiologist interpretation  Additional history obtained from chart review.    Reevaluation: After the interventions noted above, I reevaluated the patient and found that they have :improved  Social Determinants of Health: Patient lives independently   Disposition:  DC  Co morbidities that complicate the patient evaluation  Past Medical History:   Diagnosis Date   Acute blood loss anemia 01/05/2020   Acute kidney injury (HCC)    Acute respiratory failure with hypoxia (HCC)    Arthritis    COPD (chronic obstructive pulmonary disease) (HCC)    Depression    Diabetes mellitus    Diabetes mellitus without complication (HCC)    Diverticulitis    Elevated PSA 10/01/2016   Encounter for support and coordination of transition of care 01/14/2020   Hypercholesterolemia    Hyperlipidemia    Hypertension    Insomnia 01/05/2016   Ischemic colitis (HCC) 01/05/2020   Multiple lung nodules on CT 04/02/2015   Nicotine addiction    Obesity    Oxygen deficiency    qhs   Schizophrenia (HCC)      Medicines Meds ordered this encounter  Medications   morphine (PF) 4 MG/ML injection 4 mg   iohexol (OMNIPAQUE) 300 MG/ML solution 80 mL   ondansetron (ZOFRAN-ODT) 4 MG disintegrating tablet    Sig: Take 1 tablet (4 mg total) by mouth every 8 (eight) hours as needed for nausea or vomiting.    Dispense:  20 tablet    Refill:  0    I have reviewed the patients home medicines and have made adjustments as needed  Problem List / ED Course: Problem List Items Addressed This Visit   None Visit Diagnoses     Acute pancreatitis without infection or necrosis, unspecified pancreatitis type    -  Primary   Relevant Medications   morphine (PF) 4 MG/ML injection 4 mg (Completed)   Duodenitis                       This note was created using dictation software, which may contain spelling or grammatical errors.    Loetta Rough, MD 04/22/23 [phone]

## 2023-04-23 ENCOUNTER — Encounter (HOSPITAL_COMMUNITY): Payer: Self-pay

## 2023-04-23 ENCOUNTER — Ambulatory Visit (HOSPITAL_COMMUNITY): Admit: 2023-04-23 | Payer: 59

## 2023-04-23 SURGERY — ESOPHAGOGASTRODUODENOSCOPY (EGD) WITH PROPOFOL
Anesthesia: Monitor Anesthesia Care

## 2023-04-24 NOTE — Progress Notes (Unsigned)
[facility] [address], [place] [address]   CLINIC:  Medical Oncology/Hematology  PCP:  Kerri Perches, MD [address] [place] [address] [phone]   REASON FOR VISIT:  Follow-up for IgG kappa MGUS  PRIOR THERAPY: None  CURRENT THERAPY: Surveillance  INTERVAL HISTORY:   [name] 72 y.o. male returns for routine follow-up of his IgG kappa MGUS.  He was last seen by Matthew Molina on 10/18/2022.  Since his last visit, he was hospitalized from 12/08/2022 through 12/09/2022 for acute respiratory failure in the setting of influenza A and COPD exacerbation.  At today's visit, he reports feeling fair.  He continues to deny any new bone pain or recent fractures.  He denies any B symptoms such as fever, chills, night sweats, unintentional weight loss.  He reports occasional dizziness, headaches, and blurry vision.  No new neurologic symptoms.    Denies any numbness or tingling in hands or feet.  No new masses or lymphadenopathy per his report.    LDCT chest from 03/05/2023 showed esophageal thickening, and patient has been referred to GI for endoscopy and additional workup.  He denies any dysphagia or odynophagia.  He reports symptoms of acid reflux for which he takes Tums.  He has 65% energy and 100% appetite. He endorses that he is maintaining a stable weight.  ASSESSMENT & PLAN:  1.  IgG kappa MGUS - Labs sent by Dr. Wolfgang Molina from 07/04/2022 suggestive of possible monoclonal gammopathy as noted with urine immunofixation showing IgG kappa monoclonal protein, SPEP showing 1.0 g/dL spike in gamma globulin region.  Serum immunofixation confirms IgG kappa monoclonal protein.  Mildly elevated kappa free light chains 26.1 with normal ratio. - Hematology work-up (09/25/2022): Immunofixation shows IgG monoclonal protein with kappa light chain specificity SPEP shows M spike 0.9% Free light chains: Mildly elevated kappa 30.5, normal, normal  ratio 1.29 24-hour urine with UPEP/UIFE shows IgG kappa monoclonal protein, but is negative for urine M spike.  Total protein 103 mg/24 hrs (normal). Normal LDH.  Mildly elevated beta-2 microglobulin 2.7. - Skeletal survey (09/25/2022): No evidence of lytic or blastic bone lesions - Most recent MGUS/myeloma labs (04/22/2023): SPEP with stable M spike 0.7% Stable elevation in light chains (kappa 30.1, lambda 27.6) with normal FLC ratio 1.09 Normal LDH - Most recent CBC and CMP (04/18/2023) with Hgb 13.2, creatinine 1.70 (baseline CKD stage IIIb secondary to diabetes), and calcium 10.1 - No new bone pain, neurologic changes, or B symptoms - PLAN: Repeat labs and office visit in 6 months. - Annual skeletal survey next due October 2024.   2.  Esophageal thickening (INCIDENTAL FINDING) - LDCT chest (03/05/2023): Moderate focal circumferential wall thickening of lower esophagus, with recommendations given for GI referral for upper endoscopic correlation to exclude esophageal malignancy - CT abdomen/pelvis obtained in ED (04/22/2023): Continues to show circumferential wall thickening of distal esophagus as well as circumferential wall thickening and mottled enhancement of proximal duodenum with adjacent fat stranding (differential considerations include duodenitis or groove pancreatitis, recommending correlation with lipase levels and endoscopy after resolution of acute symptomatology to exclude underlying neoplasm) - Patient was referred to GI, saw Dr. Marletta Molina on 03/15/2023.  Endoscopy was scheduled for 04/23/2023, but was delayed due to ED visit for abdominal pain on 04/22/2023. - PLAN: Continue follow-up with gastroenterology for endoscopy  3.   Mild hypercalcemia - CMP from 07/04/2022 shows borderline elevated calcium at 10.4 - Low urine calcium at 38 - He  does not take any calcium supplement at home. - PLAN: Findings suggest hypocalciuric hypercalcemia.  Will defer monitoring and management to Dr. Wolfgang Molina.    4.  Tobacco abuse - Patient reports smoking 1-2 PPD cigarettes since age 43 - Most recent LDCT chest (03/02/2022) was lung RADS 2, benign appearance/behavior but with incidental finding of stable 4.3 cm diameter ascending thoracic aorta (recommend continued attention on follow-up of annual LCS) - LDCT chest (03/05/2023): Lung RADS 2, benign.  Additional findings include previously known thoracic aortic aneurysm, CAD, emphysema, aortic atherosclerosis.  Esophageal thickening addressed above. - PLAN: Continue annual LDCT scans for lung cancer screening, as patient will qualify until age 46.   5.  Other history - PMH: CKD stage IIIb secondary to diabetes, familial hypocalciuric hypercalcemia, chronic back pain, tobacco abuse, type 2 diabetes mellitus, COPD, hypertension, schizophrenia,  and obesity - SOCIAL: Lives at home alone.  Has caregiver 6 days of the week.  Used to work in [employer], but denies any major chemical exposures. - SUBSTANCE: Smokes 1-2 PPD cigarettes since age 35.  Remote history of drug use 50+ years ago, denies any IVDU.  Prior alcohol use (1 to 2 cans of beer daily), quit in 2023. - FAMILY: Negative for multiple myeloma, cancer, and blood disorders.  PLAN SUMMARY: >> Labs in 6 months = SPEP, light chains, LDH, CMP, CBC/D >> Skeletal survey ("DG Bone Survey Met") in 6 months >> OFFICE visit in 6 months (1 week after labs/x-ray)      REVIEW OF SYSTEMS:   Review of Systems  Constitutional:  Positive for fatigue. Negative for appetite change, chills, diaphoresis, fever and unexpected weight change.  HENT:   Negative for lump/mass and nosebleeds.   Eyes:  Negative for eye problems.  Respiratory:  Positive for cough and shortness of breath. Negative for hemoptysis.   Cardiovascular:  Negative for chest pain, leg swelling and palpitations.  Gastrointestinal:  Positive for constipation. Negative for abdominal pain, blood in stool, diarrhea, nausea and vomiting.  Genitourinary:   Negative for hematuria.   Skin: Negative.   Neurological:  Positive for headaches. Negative for dizziness and light-headedness.  Hematological:  Does not bruise/bleed easily.  Psychiatric/Behavioral:  The patient is nervous/anxious.      PHYSICAL EXAM:  ECOG PERFORMANCE STATUS: 1 - Symptomatic but completely ambulatory  There were no vitals filed for this visit. There were no vitals filed for this visit. Physical Exam Constitutional:      Appearance: Normal appearance. He is obese.  Cardiovascular:     Heart sounds: Normal heart sounds.  Pulmonary:     Breath sounds: Normal breath sounds. Decreased air movement present.  Neurological:     General: No focal deficit present.     Mental Status: Mental status is at baseline.  Psychiatric:        Behavior: Behavior normal. Behavior is cooperative.    PAST MEDICAL/SURGICAL HISTORY:  Past Medical History:  Diagnosis Date   Acute blood loss anemia 01/05/2020   Acute kidney injury (HCC)    Acute respiratory failure with hypoxia (HCC)    Arthritis    COPD (chronic obstructive pulmonary disease) (HCC)    Depression    Diabetes mellitus    Diabetes mellitus without complication (HCC)    Diverticulitis    Elevated PSA 10/01/2016   Encounter for support and coordination of transition of care 01/14/2020   Hypercholesterolemia    Hyperlipidemia    Hypertension    Insomnia 01/05/2016   Ischemic colitis (HCC) 01/05/2020  Multiple lung nodules on CT 04/02/2015   Nicotine addiction    Obesity    Oxygen deficiency    qhs   Schizophrenia [facility])    Past Surgical History:  Procedure Laterality Date   CATARACT EXTRACTION W/PHACO Left 11/20/2013   Procedure: CATARACT EXTRACTION PHACO AND INTRAOCULAR LENS PLACEMENT (IOC);  Surgeon: Gemma Payor, MD;  Location: [facility];  Service: Ophthalmology;  Laterality: Left;  CDE:10.26   CATARACT EXTRACTION W/PHACO Right 12/08/2013   Procedure: RIGHT EYE CATARACT EXTRACTION PHACO AND INTRAOCULAR LENS  PLACEMENT ;  Surgeon: Gemma Payor, MD;  Location: [facility];  Service: Ophthalmology;  Laterality: Right;  CDE 12.38   COLONOSCOPY N/A 04/01/2013   pancolonic diverticulosis, redundant colon, large internal hemorrhoids.    COLONOSCOPY  2014   INCOMPLETE PREP IN R COLON   COLONOSCOPY N/A 04/01/2020   Procedure: COLONOSCOPY;  Surgeon: West Bali, MD;  Location: [facility];  Service: Endoscopy;  Laterality: N/A;  8:45am   ESOPHAGOGASTRODUODENOSCOPY N/A 02/18/2016   stricture at GE junction, moderate erosive gastritis and mild non-erosive duodenitis. +H.pylori. Treated initially with Prevpac. Breath test to check for eradication was positive, and he was prescribed Pylera.    EYE SURGERY Left 11/2013   cataract extraction   POLYPECTOMY  04/01/2020   Procedure: POLYPECTOMY;  Surgeon: West Bali, MD;  Location: [facility];  Service: Endoscopy;;   SAVORY DILATION N/A 02/18/2016   Procedure: SAVORY DILATION;  Surgeon: West Bali, MD;  Location: [facility];  Service: Endoscopy;  Laterality: N/A;   SHOULDER SURGERY      SOCIAL HISTORY:  Social History   Socioeconomic History   Marital status: Divorced    Spouse name: Not on file   Number of children: 2   Years of education: Not on file   Highest education level: Not on file  Occupational History   Occupation: retired from Theatre stage manager   Tobacco Use   Smoking status: Every Day    Packs/day: 2.00    Years: 48.00    Additional pack years: 0.00    Total pack years: 96.00    Types: Cigarettes   Smokeless tobacco: Never  Vaping Use   Vaping Use: Never used  Substance and Sexual Activity   Alcohol use: Yes    Alcohol/week: 0.0 standard drinks of alcohol    Comment: "little bit of beer"   Drug use: No   Sexual activity: Not Currently    Birth control/protection: None  Other Topics Concern   Not on file  Social History Narrative   ** Merged History Encounter **       ** Merged History Encounter **       Social Determinants of  Health   Financial Resource Strain: Low Risk  (12/13/2022)   Overall Financial Resource Strain (CARDIA)    Difficulty of Paying Living Expenses: Not very hard  Food Insecurity: No Food Insecurity (11/27/2022)   Hunger Vital Sign    Worried About Running Out of Food in the Last Year: Never true    Ran Out of Food in the Last Year: Never true  Transportation Needs: No Transportation Needs (12/13/2022)   PRAPARE - Administrator, Civil Service (Medical): No    Lack of Transportation (Non-Medical): No  Physical Activity: Inactive (11/27/2022)   Exercise Vital Sign    Days of Exercise per Week: 0 days    Minutes of Exercise per Session: 0 min  Stress: No Stress Concern Present (11/27/2022)   [place]  [employer] of Occupational Health - Occupational Stress Questionnaire    Feeling of Stress : Not at all  Social Connections: Moderately Isolated (11/27/2022)   Social Connection and Isolation Panel [NHANES]    Frequency of Communication with Friends and Family: More than three times a week    Frequency of Social Gatherings with Friends and Family: More than three times a week    Attends Religious Services: More than 4 times per year    Active Member of [employer] or Organizations: No    Attends Banker Meetings: Never    Marital Status: Divorced  Catering manager Violence: Not At Risk (11/27/2022)   Humiliation, Afraid, Rape, and Kick questionnaire    Fear of Current or Ex-Partner: No    Emotionally Abused: No    Physically Abused: No    Sexually Abused: No    FAMILY HISTORY:  Family History  Problem Relation Age of Onset   Diabetes Mother    Hypertension Mother    Stroke Mother    Diabetes Sister    Heart disease Sister    Kidney disease Father    Drug abuse Brother    Colon cancer Neg Hx    Colon polyps Neg Hx     CURRENT MEDICATIONS:  Outpatient Encounter Medications as of 04/25/2023  Medication Sig Note   ACCU-CHEK GUIDE test strip USE AS DIRECTED TO  CHECK BLOOD SUGAR ONCE DAILY.    ACCU-CHEK GUIDE test strip USE TO CHECK BLOOD SUGAR ONCE DAILY    Accu-Chek Softclix Lancets lancets TEST BLOOD SUGAR ONCE DAILY AS DIRECTED.    acetaminophen (TYLENOL) 500 MG tablet Take 500-1,000 mg by mouth as needed for headache. Takes mostly for HA about 2 x/week.    albuterol (VENTOLIN HFA) 108 (90 Base) MCG/ACT inhaler INHALE 2 PUFFS INTO THE LUNGS EVERY 6 HOURS AS NEEDED FOR WHEEZING OR SHORTNESS OF BREATH.    aspirin 81 MG EC tablet Take 81 mg by mouth daily. Swallow whole.    busPIRone (BUSPAR) 7.5 MG tablet TAKE (1) TABLET BY MOUTH THREE TIMES A DAY. (Patient taking differently: Take 7.5 mg by mouth 3 (three) times daily.)    carvedilol (COREG) 6.25 MG tablet Take 6.25 mg by mouth 2 (two) times daily with a meal.    docusate sodium (COLACE) 100 MG capsule Take 100 mg by mouth daily as needed for mild constipation.     FARXIGA 5 MG TABS tablet Take 5 mg by mouth every morning.    fenofibrate (TRICOR) 145 MG tablet TAKE 1 TABLET BY MOUTH DAILY. STOP TAKING EZETIMIBE. (Patient taking differently: Take 145 mg by mouth daily.)    glipiZIDE (GLUCOTROL XL) 10 MG 24 hr tablet TAKE (2) TABLETS BY MOUTH DAILY EVERY MORNING AT BREAKFAST. (Patient taking differently: Take 10 mg by mouth daily with breakfast.)    ipratropium-albuterol (DUONEB) 0.5-2.5 (3) MG/3ML SOLN INHALE 1 VIAL VIA NEBULIZER EVERY 4 TO 6 HOURS.    Lancets 30G MISC Once daily testing dx e11.9    meloxicam (MOBIC) 7.5 MG tablet TAKE 1 TABLET BY MOUTH ONCE DAILY.    montelukast (SINGULAIR) 10 MG tablet Take 1 tablet (10 mg total) by mouth at bedtime.    nicotine (NICODERM CQ - DOSED IN MG/24 HOURS) 14 mg/24hr patch Place 1 patch (14 mg total) onto the skin daily. (Patient not taking: Reported on 03/15/2023)    nicotine (NICODERM CQ) 21 mg/24hr patch Place 1 patch (21 mg total) onto the skin daily. (Patient not taking: Reported on 04/22/2023)  olmesartan (BENICAR) 40 MG tablet TAKE ONE TABLET BY MOUTH  DAILY    ondansetron (ZOFRAN-ODT) 4 MG disintegrating tablet Take 1 tablet (4 mg total) by mouth every 8 (eight) hours as needed for nausea or vomiting.    pantoprazole (PROTONIX) 40 MG tablet TAKE ONE TABLET BY MOUTH ONCE DAILY.    risperiDONE (RISPERDAL) 1 MG tablet TAKE ONE TABLET BY MOUTH IN THE EVENING. TAKE WITH 4MG  TABLET. (Patient taking differently: Take 1 mg by mouth every evening. Take with 4 mg tablet)    risperidone (RISPERDAL) 4 MG tablet TAKE ONE TABLET BY MOUTH IN THE EVENING. TAKE WITH 1MG  TABLET.    rosuvastatin (CRESTOR) 10 MG tablet TAKE ONE TABLET BY MOUTH ONCE DAILY.    sucralfate (CARAFATE) 1 g tablet TAKE 1 TABLET BY MOUTH FOUR TIMES A DAY AS NEEDED. (Patient not taking: Reported on 03/15/2023) 12/08/2022: Pt doesn't recall this med, no pharmacy records to support    SYMBICORT 160-4.5 MCG/ACT inhaler INHALE 2 PUFFS INTO THE LUNGS ONCE IN THE MORNING AND AT BEDTIME. (Patient taking differently: Inhale 2 puffs into the lungs 2 (two) times daily.)    tamsulosin (FLOMAX) 0.4 MG CAPS capsule Take 1 capsule (0.4 mg total) by mouth daily after supper.    traZODone (DESYREL) 50 MG tablet TAKE ONE TABLET BY MOUTH AT BEDTIME AS NEEDED FOR SLEEP.    triamterene-hydrochlorothiazide (MAXZIDE-25) 37.5-25 MG tablet TAKE (1/2) TABLET BY MOUTH ONCE DAILY. (Patient taking differently: Take 1 tablet by mouth See admin instructions. TAKE (1/2) TABLET BY MOUTH ONCE DAILY.)    UNABLE TO FIND Med Name: TDAP VACCINE    vitamin B-12 (CYANOCOBALAMIN) 1000 MCG tablet Take 1,000 mcg by mouth daily.    [DISCONTINUED] Cholecalciferol (D-3-5) 125 MCG (5000 UT) capsule Take 5,000 Units by mouth daily. (Patient not taking: Reported on 04/22/2023)    [DISCONTINUED] sildenafil (VIAGRA) 100 MG tablet Take 100 mg by mouth. Take one tablet 30 mins before intercourse     No facility-administered encounter medications on file as of 04/25/2023.    ALLERGIES:  Allergies  Allergen Reactions   Sertraline Hcl      Stomach upset/pain   Wellbutrin [Bupropion] Other (See Comments)    Makes stomach hurt    LABORATORY DATA:  I have reviewed the labs as listed.  CBC    Component Value Date/Time   WBC 8.7 04/22/2023 1604   RBC 4.72 04/22/2023 1604   HGB 13.7 04/22/2023 1604   HGB 13.9 06/23/2021 1058   HCT 43.2 04/22/2023 1604   HCT 41.7 06/23/2021 1058   PLT 197 04/22/2023 1604   PLT 194 06/23/2021 1058   MCV 91.5 04/22/2023 1604   MCV 88 06/23/2021 1058   MCH 29.0 04/22/2023 1604   MCHC 31.7 04/22/2023 1604   RDW 14.1 04/22/2023 1604   RDW 12.7 06/23/2021 1058   LYMPHSABS 1.7 04/22/2023 1604   LYMPHSABS 2.1 06/23/2021 1058   MONOABS 0.7 04/22/2023 1604   EOSABS 0.2 04/22/2023 1604   EOSABS 0.4 06/23/2021 1058   BASOSABS 0.0 04/22/2023 1604   BASOSABS 0.0 06/23/2021 1058      Latest Ref Rng & Units 04/22/2023    4:04 PM 04/18/2023   10:13 AM 03/28/2023   10:57 AM  CMP  Glucose 70 - 99 mg/dL 295  621  308   BUN 8 - 23 mg/dL 22  21  23    Creatinine 0.61 - 1.24 mg/dL 6.57  8.46  9.62   Sodium 135 - 145 mmol/L 133  135  140   Potassium 3.5 - 5.1 mmol/L 3.5  3.6  4.1   Chloride 98 - 111 mmol/L 98  102  101   CO2 22 - 32 mmol/L 26  24  25    Calcium 8.9 - 10.3 mg/dL 16.1  09.6  04.5   Total Protein 6.5 - 8.1 g/dL 7.5  7.6  7.4   Total Bilirubin 0.3 - 1.2 mg/dL 0.8  0.6  0.3   Alkaline Phos 38 - 126 U/L 30  36  41   AST 15 - 41 U/L 20  24  22    ALT 0 - 44 U/L 26  29  28      DIAGNOSTIC IMAGING:  I have independently reviewed the relevant imaging and discussed with the patient.   WRAP UP:  All questions were answered. The patient knows to call the clinic with any problems, questions or concerns.  Medical decision making: Moderate  Time spent on visit: I spent 20 minutes counseling the patient face to face. The total time spent in the appointment was 30 minutes and more than 50% was on counseling.  Carnella Guadalajara, Molina  04/25/23 10:03 AM

## 2023-04-25 ENCOUNTER — Inpatient Hospital Stay (HOSPITAL_BASED_OUTPATIENT_CLINIC_OR_DEPARTMENT_OTHER): Payer: 59 | Admitting: Physician Assistant

## 2023-04-25 ENCOUNTER — Ambulatory Visit: Payer: Self-pay | Admitting: Physician Assistant

## 2023-04-25 ENCOUNTER — Ambulatory Visit: Payer: Medicare Other | Admitting: Physician Assistant

## 2023-04-25 ENCOUNTER — Other Ambulatory Visit: Payer: Self-pay

## 2023-04-25 VITALS — BP 122/83 | HR 80 | Temp 98.2°F | Resp 18 | Wt 229.6 lb

## 2023-04-25 DIAGNOSIS — K2289 Other specified disease of esophagus: Secondary | ICD-10-CM

## 2023-04-25 DIAGNOSIS — F17210 Nicotine dependence, cigarettes, uncomplicated: Secondary | ICD-10-CM | POA: Diagnosis not present

## 2023-04-25 DIAGNOSIS — I129 Hypertensive chronic kidney disease with stage 1 through stage 4 chronic kidney disease, or unspecified chronic kidney disease: Secondary | ICD-10-CM | POA: Diagnosis not present

## 2023-04-25 DIAGNOSIS — C9000 Multiple myeloma not having achieved remission: Secondary | ICD-10-CM | POA: Diagnosis not present

## 2023-04-25 DIAGNOSIS — E1122 Type 2 diabetes mellitus with diabetic chronic kidney disease: Secondary | ICD-10-CM | POA: Diagnosis not present

## 2023-04-25 DIAGNOSIS — N1832 Chronic kidney disease, stage 3b: Secondary | ICD-10-CM | POA: Diagnosis not present

## 2023-04-25 DIAGNOSIS — Z87891 Personal history of nicotine dependence: Secondary | ICD-10-CM

## 2023-04-25 DIAGNOSIS — D472 Monoclonal gammopathy: Secondary | ICD-10-CM | POA: Diagnosis not present

## 2023-04-25 NOTE — Patient Instructions (Signed)
[facility] at [facility] **VISIT SUMMARY & IMPORTANT INSTRUCTIONS **   You were seen today by Rojelio Brenner PA-C.  MGUS (Monoclonal Gammopathy of Undetermined Significance) Your labs showed an abnormal protein in your blood.  This protein does not appear to be causing any problems at this time, but can progress to a type of cancer called multiple myeloma in about 1% of patients each year. You do not have any signs of multiple myeloma blood cancer at this time. We will check your labs again in 6 months to make sure that there have not been any major changes.   You will need to have Xrays of your whole body in 6 months.  We do this once a year to make sure that your bones do not show any signs of myeloma cancer.  ESOPHAGEAL THICKENING:  You had some thickening of your esophagus.  It is important that you see your GI doctor (Dr. Marletta Lor  at [facility] at [phone]) so that he can find out why your esophagus looks thicker than normal.  FOLLOW-UP APPOINTMENT: Office visit in 6 months, 1 week after labs  ** Thank you for trusting me with your healthcare!  I strive to provide all of my patients with quality care at each visit.  If you receive a survey for this visit, I would be so grateful to you for taking the time to provide feedback.  Thank you in advance!  ~ Hebert Dooling                   Dr. Doreatha Massed   &   Rojelio Brenner, PA-C   - - - - - - - - - - - - - - - - - -    Thank you for choosing [facility] at [facility] to provide your oncology and hematology care.  To afford each patient quality time with our provider, please arrive at least 15 minutes before your scheduled appointment time.   If you have a lab appointment with the Cancer Center please come in thru the Main Entrance and check in at the main information desk.  You need to re-schedule your appointment should you arrive 10 or more minutes  late.  We strive to give you quality time with our providers, and arriving late affects you and other patients whose appointments are after yours.  Also, if you no show three or more times for appointments you may be dismissed from the clinic at the providers discretion.     Again, thank you for choosing [facility].  Our hope is that these requests will decrease the amount of time that you wait before being seen by our physicians.       _____________________________________________________________  Should you have questions after your visit to [facility], please contact our office at [phone] and follow the prompts.  Our office hours are 8:00 a.m. and 4:30 p.m. Monday - Friday.  Please note that voicemails left after 4:00 p.m. may not be returned until the following business day.  We are closed weekends and major holidays.  You do have access to a nurse 24-7, just call the main number to the clinic [phone] and do not press any options, hold on the line and a nurse will answer the phone.    For prescription refill requests, have your pharmacy contact our office and allow 72 hours.

## 2023-04-26 ENCOUNTER — Encounter: Payer: Self-pay | Admitting: Family Medicine

## 2023-04-26 ENCOUNTER — Ambulatory Visit (INDEPENDENT_AMBULATORY_CARE_PROVIDER_SITE_OTHER): Payer: 59 | Admitting: Family Medicine

## 2023-04-26 VITALS — BP 127/79 | HR 80 | Ht 73.0 in | Wt 226.1 lb

## 2023-04-26 DIAGNOSIS — K859 Acute pancreatitis without necrosis or infection, unspecified: Secondary | ICD-10-CM

## 2023-04-26 DIAGNOSIS — Z720 Tobacco use: Secondary | ICD-10-CM

## 2023-04-26 DIAGNOSIS — I10 Essential (primary) hypertension: Secondary | ICD-10-CM

## 2023-04-26 DIAGNOSIS — E1169 Type 2 diabetes mellitus with other specified complication: Secondary | ICD-10-CM

## 2023-04-26 DIAGNOSIS — E669 Obesity, unspecified: Secondary | ICD-10-CM | POA: Diagnosis not present

## 2023-04-26 DIAGNOSIS — Z09 Encounter for follow-up examination after completed treatment for conditions other than malignant neoplasm: Secondary | ICD-10-CM

## 2023-04-26 DIAGNOSIS — F17210 Nicotine dependence, cigarettes, uncomplicated: Secondary | ICD-10-CM | POA: Diagnosis not present

## 2023-04-26 NOTE — Patient Instructions (Signed)
F/U in July as beforewm, call iof you need me sooner   Lipase chem 7 and EGFr,  and amylase to be drawn tomorrow around 1 pm  Thanks for choosing [facility], we consider it a privelige to serve you.

## 2023-04-27 DIAGNOSIS — I10 Essential (primary) hypertension: Secondary | ICD-10-CM | POA: Diagnosis not present

## 2023-04-27 DIAGNOSIS — E559 Vitamin D deficiency, unspecified: Secondary | ICD-10-CM | POA: Diagnosis not present

## 2023-04-27 DIAGNOSIS — K859 Acute pancreatitis without necrosis or infection, unspecified: Secondary | ICD-10-CM | POA: Diagnosis not present

## 2023-04-27 DIAGNOSIS — E1169 Type 2 diabetes mellitus with other specified complication: Secondary | ICD-10-CM | POA: Diagnosis not present

## 2023-04-27 DIAGNOSIS — E785 Hyperlipidemia, unspecified: Secondary | ICD-10-CM | POA: Diagnosis not present

## 2023-04-28 LAB — CMP14+EGFR
ALT: 27 IU/L (ref 0–44)
AST: 22 IU/L (ref 0–40)
Albumin/Globulin Ratio: 1.6 (ref 1.2–2.2)
Albumin: 4.3 g/dL (ref 3.8–4.8)
Alkaline Phosphatase: 39 IU/L — ABNORMAL LOW (ref 44–121)
BUN/Creatinine Ratio: 12 (ref 10–24)
BUN: 20 mg/dL (ref 8–27)
Bilirubin Total: 0.4 mg/dL (ref 0.0–1.2)
CO2: 21 mmol/L (ref 20–29)
Calcium: 10.4 mg/dL — ABNORMAL HIGH (ref 8.6–10.2)
Chloride: 100 mmol/L (ref 96–106)
Creatinine, Ser: 1.65 mg/dL — ABNORMAL HIGH (ref 0.76–1.27)
Globulin, Total: 2.7 g/dL (ref 1.5–4.5)
Glucose: 194 mg/dL — ABNORMAL HIGH (ref 70–99)
Potassium: 4 mmol/L (ref 3.5–5.2)
Sodium: 136 mmol/L (ref 134–144)
Total Protein: 7 g/dL (ref 6.0–8.5)
eGFR: 44 mL/min/{1.73_m2} — ABNORMAL LOW (ref >59)

## 2023-04-28 LAB — CBC
Hematocrit: 39.8 % (ref 37.5–51.0)
Hemoglobin: 13 g/dL (ref 13.0–17.7)
MCH: 28.4 pg (ref 26.6–33.0)
MCHC: 32.7 g/dL (ref 31.5–35.7)
MCV: 87 fL (ref 79–97)
Platelets: 205 10*3/uL (ref 150–450)
RBC: 4.58 x10E6/uL (ref 4.14–5.80)
RDW: 13 % (ref 11.6–15.4)
WBC: 5.3 10*3/uL (ref 3.4–10.8)

## 2023-04-28 LAB — LIPID PANEL
Chol/HDL Ratio: 4 ratio (ref 0.0–5.0)
Cholesterol, Total: 133 mg/dL (ref 100–199)
HDL: 33 mg/dL — ABNORMAL LOW (ref >39)
LDL Chol Calc (NIH): 72 mg/dL (ref 0–99)
Triglycerides: 165 mg/dL — ABNORMAL HIGH (ref 0–149)
VLDL Cholesterol Cal: 28 mg/dL (ref 5–40)

## 2023-04-28 LAB — TSH: TSH: 1.82 u[IU]/mL (ref 0.450–4.500)

## 2023-04-28 LAB — HEMOGLOBIN A1C
Est. average glucose Bld gHb Est-mCnc: 157 mg/dL
Hgb A1c MFr Bld: 7.1 % — ABNORMAL HIGH (ref 4.8–5.6)

## 2023-04-28 LAB — VITAMIN D 25 HYDROXY (VIT D DEFICIENCY, FRACTURES): Vit D, 25-Hydroxy: 40.3 ng/mL (ref 30.0–100.0)

## 2023-04-30 ENCOUNTER — Telehealth: Payer: Self-pay | Admitting: *Deleted

## 2023-04-30 ENCOUNTER — Encounter: Payer: Self-pay | Admitting: Family Medicine

## 2023-04-30 ENCOUNTER — Encounter: Payer: Self-pay | Admitting: Gastroenterology

## 2023-04-30 ENCOUNTER — Ambulatory Visit (INDEPENDENT_AMBULATORY_CARE_PROVIDER_SITE_OTHER): Payer: 59 | Admitting: Gastroenterology

## 2023-04-30 VITALS — BP 124/78 | HR 73 | Temp 97.9°F | Ht 73.0 in | Wt 227.0 lb

## 2023-04-30 DIAGNOSIS — R933 Abnormal findings on diagnostic imaging of other parts of digestive tract: Secondary | ICD-10-CM | POA: Diagnosis not present

## 2023-04-30 DIAGNOSIS — R110 Nausea: Secondary | ICD-10-CM

## 2023-04-30 DIAGNOSIS — K7581 Nonalcoholic steatohepatitis (NASH): Secondary | ICD-10-CM | POA: Diagnosis not present

## 2023-04-30 DIAGNOSIS — K219 Gastro-esophageal reflux disease without esophagitis: Secondary | ICD-10-CM

## 2023-04-30 DIAGNOSIS — K859 Acute pancreatitis without necrosis or infection, unspecified: Secondary | ICD-10-CM | POA: Diagnosis not present

## 2023-04-30 DIAGNOSIS — R7989 Other specified abnormal findings of blood chemistry: Secondary | ICD-10-CM | POA: Diagnosis not present

## 2023-04-30 DIAGNOSIS — D122 Benign neoplasm of ascending colon: Secondary | ICD-10-CM

## 2023-04-30 DIAGNOSIS — Z09 Encounter for follow-up examination after completed treatment for conditions other than malignant neoplasm: Secondary | ICD-10-CM | POA: Insufficient documentation

## 2023-04-30 MED ORDER — LINACLOTIDE 72 MCG PO CAPS: 72.0000 ug | ORAL_CAPSULE | Freq: Every day | ORAL | 1 refills | Status: DC

## 2023-04-30 NOTE — Assessment & Plan Note (Signed)
Patient in for follow up of recent hospitalization. Discharge summary, and laboratory and radiology data are reviewed, and any questions or concerns  are discussed. Specific issues requiring follow up are specifically addressed.  

## 2023-04-30 NOTE — Patient Instructions (Addendum)
Continue taking pantoprazole 40 mg once daily. Follow a GERD diet:  Avoid fried, fatty, greasy, spicy, citrus foods. Avoid caffeine and carbonated beverages. Avoid chocolate. Try eating 4-6 small meals a day rather than 3 large meals. Do not eat within 3 hours of laying down. Prop head of bed up on wood or bricks to create a 6 inch incline. Continue to avoid alcohol  We are going to get you rescheduled for your upper endoscopy.  It is very important that you keep this procedure so we can further evaluate your esophagus as well as your upper abdominal pain and nausea.  I am providing you some samples of Linzess today.  You may take 1 capsule daily, 30 minutes prior to breakfast.  How to take Linzess: Once a day every day on empty stomach, at least 30 minutes before your first meal of the day. It is best to keep medications at a stable temperature Medication is best kept in its original bottle with the disket present.  It is a medication that is meant for everyday use and not to be used as needed.   What to expect: Constipation relief is typically felt in about 1 week Relief of abdominal pain, discomfort, and bloating begins in about 1 week with symptoms typically improving over 12 weeks and beyond. Diarrhea is most common side effect and typically begins within the first 2 weeks and can take 3-4 weeks to resolve It would be helpful to begin treatment over the weekend or when you can be closer to a bathroom   You can go to [email or web address] for patient support and sign up for daily medication reminders.   It was a pleasure to see you today. I want to create trusting relationships with patients. If you receive a survey regarding your visit,  I greatly appreciate you taking time to fill this out on paper or through your MyChart. I value your feedback.  Brooke Bonito, MSN, FNP-BC, AGACNP-BC [facility]

## 2023-04-30 NOTE — Assessment & Plan Note (Signed)
Controlled, no change in medication DASH diet and commitment to daily physical activity for a minimum of 30 minutes discussed and encouraged, as a part of hypertension management. The importance of attaining a healthy weight is also discussed.     04/26/2023   11:28 AM 04/25/2023    9:32 AM 04/22/2023    7:54 PM 04/22/2023    4:00 PM 04/22/2023    3:51 PM 03/28/2023   10:00 AM 03/15/2023    8:33 AM  BP/Weight  Systolic BP 127 122 135 120 128 124 128  Diastolic BP 79 83 88 97 90 78 84  Wt. (Lbs) 226.12 229.6    234.04 234.2  BMI 29.83 kg/m2 30.29 kg/m2    30.88 kg/m2 30.9 kg/m2

## 2023-04-30 NOTE — Assessment & Plan Note (Signed)
Symptomatically better, has GI appointment next week, rept amylase and lipase in am

## 2023-04-30 NOTE — Assessment & Plan Note (Signed)
Controlled, no change in medication  

## 2023-04-30 NOTE — Progress Notes (Signed)
GI Office Note    Referring Provider: Kerri Perches, MD Primary Care Physician:  Kerri Perches, MD Primary Gastroenterologist: Hennie Duos. Marletta Lor, DO  Date:  04/30/2023  ID:  Matthew Molina, DOB May 06, 1951, MRN [id number]   Chief Complaint   Chief Complaint  Patient presents with   Follow-up    Hospital follow up. Still having stomach problems and constipation    History of Present Illness  Matthew Molina is a 72 y.o. male with a history of depression, COPD, diabetes, ischemic colitis in 2021, HLD, HTN presenting today with complaint of upper abdominal pain and constipation.  EGD March 2017: -Esophageal stricture s/p dilation -Gastritis and duodenitis -Biopsies positive for H. Pylori  Initially treated for H. pylori with Prevpac and then subsequently Pylera.  Has had documented eradication.  Colonoscopy April 2021: -7 colon polyps -Pancolonic diverticulosis -External and internal hemorrhoids -Pathology revealed tubular adenomas -Recommend repeat colonoscopy in 3 years  CT chest 03/05/2023: -Moderate focal circumferential wall thickening of lower esophagus, recommended GI referral for endoscopic correlation to exclude esophageal malignancy -Lung RADS 2, benign appearance -Stable mildly dilated ascending thoracic aorta measuring 4.3 cm  Last seen in the office 03/15/2023 by Dr. Marletta Lor. Noted to have found distal esophageal wall thickening on CT chest for lung cancer screening.  Patient denies any dysphagia but does report chronic reflux which is well-controlled on pantoprazole 40 mg once daily.  Denies any family history of gastrointestinal cancer.  Did report taking Mobic daily for joint pains. Advised continue PPI daily.  Colonoscopy offered but patient declined.  Advised to schedule for EGD to further evaluate abnormal esophagus on CT scan.  Advised fatty liver diet for abnormal LFTs.  ED visit 5/5 for abdominal pain.  Brought in by EMS for epigastric and  suprapubic abdominal pain for 2-3 days with nausea and vomiting and decreased p.o. intake.  Reported similar symptoms once before that he stated was due to constipation.  Admitted to drinking at least 1 can of beer per day.  Abdominal exam without any peritoneal signs.  UA was negative for UTI, WBC 8.7, hemoglobin 13.7, creatinine 1.53, lipase 124.  CT with circumferential wall thickening and moderate enhancement of proximal duodenum with adjacent fat stranding with differentials including duodenitis or groove pancreatitis, circumferential wall thickening of distal esophagus which can be seen in the setting of esophagitis.  He was noted to have epigastric tenderness on exam.  He was recommended to follow a liquid diet and was given nausea and pain control to take at home and advised on small nonfatty meals to start in a few days.  He was discussed the importance of scheduling his upper endoscopy and patient reported he needed more time.  Advised to follow-up if symptoms worsened.  Today: When he eats food his stomach bothers him and he lays down and sips on sprite zero and that sometimes helps calm him down.Diet consists of soft foods, applesauce, few eggs. Staying away from fried foods. Drinking a lot of liquids. Denies vomiting but has had some nausea. Still taking pantoprazole once daily. Has used the zofran that was given from the ED, using it every 8 hours as needed.   Stopped taking B12 and now is only tacking a multivitamin.   Was drinking alcohol but stopped just before going to the ED and has not resumed.  Has been taking dulcolax or another over the counter laxative to help with constipation. Reports he is going to the bathroom a little bit everyday.  Denies straining. Denies melena or brbpr. Wants to try Linzess but states he was told it is helpful with constipation and wants to try it.   Denies dysphagia.    Current Outpatient Medications  Medication Sig Dispense Refill   ACCU-CHEK GUIDE  test strip USE AS DIRECTED TO CHECK BLOOD SUGAR ONCE DAILY. 50 strip 0   ACCU-CHEK GUIDE test strip USE TO CHECK BLOOD SUGAR ONCE DAILY 100 strip 0   Accu-Chek Softclix Lancets lancets TEST BLOOD SUGAR ONCE DAILY AS DIRECTED. 100 each 5   acetaminophen (TYLENOL) 500 MG tablet Take 500-1,000 mg by mouth as needed for headache. Takes mostly for HA about 2 x/week.     albuterol (VENTOLIN HFA) 108 (90 Base) MCG/ACT inhaler INHALE 2 PUFFS INTO THE LUNGS EVERY 6 HOURS AS NEEDED FOR WHEEZING OR SHORTNESS OF BREATH. 8.5 g 0   aspirin 81 MG EC tablet Take 81 mg by mouth daily. Swallow whole.     busPIRone (BUSPAR) 7.5 MG tablet TAKE (1) TABLET BY MOUTH THREE TIMES A DAY. (Patient taking differently: Take 7.5 mg by mouth 3 (three) times daily.) 90 tablet 0   carvedilol (COREG) 6.25 MG tablet Take 6.25 mg by mouth 2 (two) times daily with a meal.     FARXIGA 5 MG TABS tablet Take 5 mg by mouth every morning.     fenofibrate (TRICOR) 145 MG tablet TAKE 1 TABLET BY MOUTH DAILY. STOP TAKING EZETIMIBE. (Patient taking differently: Take 145 mg by mouth daily.) 30 tablet 0   glipiZIDE (GLUCOTROL XL) 10 MG 24 hr tablet TAKE (2) TABLETS BY MOUTH DAILY EVERY MORNING AT BREAKFAST. (Patient taking differently: Take 10 mg by mouth daily with breakfast.) 60 tablet 0   ipratropium-albuterol (DUONEB) 0.5-2.5 (3) MG/3ML SOLN INHALE 1 VIAL VIA NEBULIZER EVERY 4 TO 6 HOURS. 540 mL 0   Lancets 30G MISC Once daily testing dx e11.9 100 each 5   meloxicam (MOBIC) 7.5 MG tablet TAKE 1 TABLET BY MOUTH ONCE DAILY. 30 tablet 0   montelukast (SINGULAIR) 10 MG tablet Take 1 tablet (10 mg total) by mouth at bedtime. 30 tablet 3   Multiple Vitamins-Minerals (MULTIVITAMIN WITH MINERALS) tablet Take 1 tablet by mouth daily.     olmesartan (BENICAR) 40 MG tablet TAKE ONE TABLET BY MOUTH DAILY 90 tablet 0   ondansetron (ZOFRAN-ODT) 4 MG disintegrating tablet Take 1 tablet (4 mg total) by mouth every 8 (eight) hours as needed for nausea or  vomiting. 20 tablet 0   pantoprazole (PROTONIX) 40 MG tablet TAKE ONE TABLET BY MOUTH ONCE DAILY. 30 tablet 5   Polyethyl Glycol-Propyl Glycol (SYSTANE) 0.4-0.3 % GEL ophthalmic gel Place 1 Application into both eyes.     risperidone (RISPERDAL) 4 MG tablet TAKE ONE TABLET BY MOUTH IN THE EVENING. TAKE WITH 1MG  TABLET. 90 tablet 0   rosuvastatin (CRESTOR) 10 MG tablet TAKE ONE TABLET BY MOUTH ONCE DAILY. 90 tablet 0   sucralfate (CARAFATE) 1 g tablet TAKE 1 TABLET BY MOUTH FOUR TIMES A DAY AS NEEDED. 30 tablet 0   SYMBICORT 160-4.5 MCG/ACT inhaler INHALE 2 PUFFS INTO THE LUNGS ONCE IN THE MORNING AND AT BEDTIME. (Patient taking differently: Inhale 2 puffs into the lungs 2 (two) times daily.) 10.2 g 0   tamsulosin (FLOMAX) 0.4 MG CAPS capsule Take 1 capsule (0.4 mg total) by mouth daily after supper. 90 capsule 3   traZODone (DESYREL) 50 MG tablet TAKE ONE TABLET BY MOUTH AT BEDTIME AS NEEDED FOR SLEEP. 90 tablet 0  triamterene-hydrochlorothiazide (MAXZIDE-25) 37.5-25 MG tablet TAKE (1/2) TABLET BY MOUTH ONCE DAILY. (Patient taking differently: Take 1 tablet by mouth See admin instructions. TAKE (1/2) TABLET BY MOUTH ONCE DAILY.) 45 tablet 0   docusate sodium (COLACE) 100 MG capsule Take 100 mg by mouth daily as needed for mild constipation.  (Patient not taking: Reported on 04/30/2023)     nicotine (NICODERM CQ - DOSED IN MG/24 HOURS) 14 mg/24hr patch Place 1 patch (14 mg total) onto the skin daily. (Patient not taking: Reported on 04/26/2023) 28 patch 1   nicotine (NICODERM CQ) 21 mg/24hr patch Place 1 patch (21 mg total) onto the skin daily. (Patient not taking: Reported on 04/26/2023) 28 patch 3   risperiDONE (RISPERDAL) 1 MG tablet TAKE ONE TABLET BY MOUTH IN THE EVENING. TAKE WITH 4MG  TABLET. (Patient not taking: Reported on 04/30/2023) 90 tablet 0   vitamin B-12 (CYANOCOBALAMIN) 1000 MCG tablet Take 1,000 mcg by mouth daily. (Patient not taking: Reported on 04/26/2023)     No current  facility-administered medications for this visit.    Past Medical History:  Diagnosis Date   Acute blood loss anemia 01/05/2020   Acute kidney injury (HCC)    Acute respiratory failure with hypoxia (HCC)    Arthritis    COPD (chronic obstructive pulmonary disease) (HCC)    Depression    Diabetes mellitus    Diabetes mellitus without complication (HCC)    Diverticulitis    Elevated PSA 10/01/2016   Encounter for support and coordination of transition of care 01/14/2020   Hypercholesterolemia    Hyperlipidemia    Hypertension    Insomnia 01/05/2016   Ischemic colitis (HCC) 01/05/2020   Multiple lung nodules on CT 04/02/2015   Nicotine addiction    Obesity    Oxygen deficiency    qhs   Schizophrenia [facility])     Past Surgical History:  Procedure Laterality Date   CATARACT EXTRACTION W/PHACO Left 11/20/2013   Procedure: CATARACT EXTRACTION PHACO AND INTRAOCULAR LENS PLACEMENT (IOC);  Surgeon: Gemma Payor, MD;  Location: [facility];  Service: Ophthalmology;  Laterality: Left;  CDE:10.26   CATARACT EXTRACTION W/PHACO Right 12/08/2013   Procedure: RIGHT EYE CATARACT EXTRACTION PHACO AND INTRAOCULAR LENS PLACEMENT ;  Surgeon: Gemma Payor, MD;  Location: [facility];  Service: Ophthalmology;  Laterality: Right;  CDE 12.38   COLONOSCOPY N/A 04/01/2013   pancolonic diverticulosis, redundant colon, large internal hemorrhoids.    COLONOSCOPY  2014   INCOMPLETE PREP IN R COLON   COLONOSCOPY N/A 04/01/2020   Procedure: COLONOSCOPY;  Surgeon: West Bali, MD;  Location: [facility];  Service: Endoscopy;  Laterality: N/A;  8:45am   ESOPHAGOGASTRODUODENOSCOPY N/A 02/18/2016   stricture at GE junction, moderate erosive gastritis and mild non-erosive duodenitis. +H.pylori. Treated initially with Prevpac. Breath test to check for eradication was positive, and he was prescribed Pylera.    EYE SURGERY Left 11/2013   cataract extraction   POLYPECTOMY  04/01/2020   Procedure: POLYPECTOMY;  Surgeon: West Bali, MD;  Location: [facility];  Service: Endoscopy;;   SAVORY DILATION N/A 02/18/2016   Procedure: SAVORY DILATION;  Surgeon: West Bali, MD;  Location: [facility];  Service: Endoscopy;  Laterality: N/A;   SHOULDER SURGERY      Family History  Problem Relation Age of Onset   Diabetes Mother    Hypertension Mother    Stroke Mother    Diabetes Sister    Heart disease Sister    Kidney disease Father  Drug abuse Brother    Colon cancer Neg Hx    Colon polyps Neg Hx     Allergies as of 04/30/2023 - Review Complete 04/30/2023  Allergen Reaction Noted   Sertraline hcl  04/17/2011   Wellbutrin [bupropion] Other (See Comments) 07/29/2015    Social History   Socioeconomic History   Marital status: Divorced    Spouse name: Not on file   Number of children: 2   Years of education: Not on file   Highest education level: Not on file  Occupational History   Occupation: retired from [employer] stage [employer]   Tobacco Use   Smoking status: Every Day    Packs/day: 2.00    Years: 48.00    Additional pack years: 0.00    Total pack years: 96.00    Types: Cigarettes   Smokeless tobacco: Never  Vaping Use   Vaping Use: Never used  Substance and Sexual Activity   Alcohol use: Yes    Alcohol/week: 0.0 standard drinks of alcohol    Comment: "little bit of beer"   Drug use: No   Sexual activity: Not Currently    Birth control/protection: None  Other Topics Concern   Not on file  Social History Narrative   ** Merged History Encounter **       ** Merged History Encounter **       Social Determinants of Health   Financial Resource Strain: Low Risk  (12/13/2022)   Overall Financial Resource Strain (CARDIA)    Difficulty of Paying Living Expenses: Not very hard  Food Insecurity: No Food Insecurity (11/27/2022)   Hunger Vital Sign    Worried About Running Out of Food in the Last Year: Never true    Ran Out of Food in the Last Year: Never true  Transportation Needs: No Transportation Needs  (12/13/2022)   PRAPARE - Administrator, Civil Service (Medical): No    Lack of Transportation (Non-Medical): No  Physical Activity: Inactive (11/27/2022)   Exercise Vital Sign    Days of Exercise per Week: 0 days    Minutes of Exercise per Session: 0 min  Stress: No Stress Concern Present (11/27/2022)   [employer] of Occupational Health - Occupational Stress Questionnaire    Feeling of Stress : Not at all  Social Connections: Moderately Isolated (11/27/2022)   Social Connection and Isolation Panel [NHANES]    Frequency of Communication with Friends and Family: More than three times a week    Frequency of Social Gatherings with Friends and Family: More than three times a week    Attends Religious Services: More than 4 times per year    Active Member of [employer] or Organizations: No    Attends Banker Meetings: Never    Marital Status: Divorced     Review of Systems   Gen: Denies fever, chills, anorexia. Denies fatigue, weakness, weight loss.  CV: Denies chest pain, palpitations, syncope, peripheral edema, and claudication. Resp: +dyspnea on exertion.  Denies cough, wheezing, coughing up blood, and pleurisy. GI: See HPI Derm: Denies rash, itching, dry skin Psych: Denies depression, anxiety, memory loss, confusion. No homicidal or suicidal ideation.  Heme: Denies bruising, bleeding, and enlarged lymph nodes.  Physical Exam   BP 124/78 (BP Location: Left Arm, Patient Position: Sitting, Cuff Size: Large)   Pulse 73   Temp 97.9 F (36.6 C) (Temporal)   Ht 6\' 1"  (1.854 m)   Wt 227 lb (103 kg)   SpO2 96%   BMI 29.95  kg/m   General:   Alert and oriented. No distress noted. Pleasant and cooperative.  Head:  Normocephalic and atraumatic. Eyes:  Conjuctiva clear without scleral icterus. Mouth:  Oral mucosa pink and moist. Good dentition. No lesions. Lungs:  Clear to auscultation bilaterally. No wheezes, rales, or rhonchi. No distress.  Heart:  S1, S2  present without murmurs appreciated.  Abdomen:  +BS, soft, non-tender and non-distended. No rebound or guarding. No HSM or masses noted. Rectal: deferred Msk:  Symmetrical without gross deformities. Normal posture. Extremities:  Without edema. Neurologic:  Alert and  oriented x4 Psych:  Alert and cooperative. Normal mood and affect.   Assessment  Matthew Molina is a 72 y.o. male with a history of depression, COPD, diabetes, ischemic colitis in 2021, HLD, HTN presenting today with complaint of upper abdominal pain and constipation.  Esophageal wall thickening on CT: CT chest in early March 2024 with distal esophageal wall thickening with recommendation to complete upper endoscopy to rule out malignancy.  Scheduled at his last office visit however patient called to reschedule and then canceled.  I reinforced the importance of completing upper endoscopy to evaluate his CT findings as well as to further evaluate his upper abdominal pain, reflux, and nausea.  GERD, nausea, Pancreatitis: Currently on pantoprazole 40 mg once daily.  Has occasional nausea and reports that certain foods bother him when he lays down but sometimes bright helps improve his symptoms.  Denies eating a high fat diet and has been avoiding fried foods.  Denies any prior alcohol use since his ED visit last week.  Most recent ED visit 5/5 for epigastric pain and was found to have elevated lipase and concerns for duodenitis or pancreatitis on CT scan.  Pancreatitis presumed to be secondary to alcohol use given only very mild elevation in triglycerides.  Was advised to follow a liquid diet and to slowly introduce nonfatty foods over the next few days.  He continues to have some upper abdominal discomfort and some mild tenderness on exam today.  Advised him to continue his current PPI dose, avoid alcohol, and follow a GERD and low-fat diet.  Advised he may decrease his diet to liquids for 24 hours if he begins to have any worsening  pain.  Adenomatous colon polyps: Colonoscopy in April 2021 with multiple tubular adenomas.  Recommended 3-year surveillance, currently overdue.  We again discussed today the importance of surveillance and offered at time of EGD.  Patient declined again stating he would like to wait until follow-up to discuss/consider this.  He indicates understanding that we could be missing a potential malignancy.  Abnormal LFTs, hepatic steatosis: History of chronically elevated LFTs.  Previous daily alcohol use and has other risk factors including diabetes, hyperlipidemia, and obesity.  Evidence of cholelithiasis on most recent CT abdomen pelvis but no biliary dilation. Labs 04/27/2023 with normalized LFTs.  Advised him to continue to avoid alcohol.  Constipation: History difficult to obtain.  Patient states he has been taking Dulcolax and other over-the-counter laxatives to help with constipation however reports he usually goes to the bathroom a little bit every day but denies hard stools or straining.  Request to trial Linzess due to feelings of constipation.  Will provide samples of Linzess 72 mcg today and sent in prescription.  Advised on washout period.   PLAN   Linzess 72 mcg daily, 30 minutes prior to breakfast. Samples provided Alcohol cessation Continue pantoprazole 40 mg once daily.  EGD ASA 3, Dr. Marletta Lor - New orders  placed today. Follow low-fat diet, may reduce to clear liquids if still having some upper abdominal discomfort Colonoscopy offered, patient declined. Follow up in 3 months    Brooke Bonito, MSN, FNP-BC, AGACNP-BC [facility]

## 2023-04-30 NOTE — Progress Notes (Signed)
   Matthew Molina     MRN: [id number]      DOB: 1951-03-09  Chief Complaint  Patient presents with   Follow-up    ER follow up, stomach pain,constipation    HPI Matthew Molina is here for follow up and re-evaluation following recent ED visit when he was diagnosed with acute pancreatitis on 04/22/2023. States doing better generally , denies fever, abdominal pain is essentially gone and he is able to tolerate regula diet without nausea, pain or vomiting Denies polyuria, polydipsia, blurred vision , or hypoglycemic episodes. Still smokes 1 PPD , no quit date set, trying to cut back, wants to quit ROS Denies recent fever or chills. Denies sinus pressure, nasal congestion, ear pain or sore throat. Denies chest congestion, productive cough or wheezing. Denies chest pains, palpitations and leg swelling   Denies dysuria, frequency, hesitancy or incontinence. Chronic  joint pain, swelling and limitation in mobility. Denies headaches, seizures, numbness, or tingling. Denies uncontrolled depression, anxiety or insomnia. Denies skin break down or rash.   PE  BP 127/79 (BP Location: Right Arm, Patient Position: Sitting, Cuff Size: Large)   Pulse 80   Ht 6\' 1"  (1.854 m)   Wt 226 lb 1.9 oz (102.6 kg)   SpO2 96%   BMI 29.83 kg/m   Patient alert and oriented and in no cardiopulmonary distress.  HEENT: No facial asymmetry, EOMI,     Neck supple .  Chest: Clear to auscultation bilaterally.  CVS: S1, S2 no murmurs, no S3.Regular rate.  ABD: Soft no localized tenderness , guarding or rebound, normal BS Ext: No edema  MS: decreased  ROM spine, shoulders, hips and knees.  Skin: Intact, no ulcerations or rash noted.  Psych: Good eye contact, normal affect. Memory intact not anxious or depressed appearing.  CNS: CN 2-12 intact, power,  normal throughout.no focal deficits noted.   Assessment & Plan  Encounter for examination following treatment at hospital Patient in for follow up of  recent hospitalization. Discharge summary, and laboratory and radiology data are reviewed, and any questions or concerns  are discussed. Specific issues requiring follow up are specifically addressed.   Hypertension Controlled, no change in medication DASH diet and commitment to daily physical activity for a minimum of 30 minutes discussed and encouraged, as a part of hypertension management. The importance of attaining a healthy weight is also discussed.     04/26/2023   11:28 AM 04/25/2023    9:32 AM 04/22/2023    7:54 PM 04/22/2023    4:00 PM 04/22/2023    3:51 PM 03/28/2023   10:00 AM 03/15/2023    8:33 AM  BP/Weight  Systolic BP 127 122 135 120 128 124 128  Diastolic BP 79 83 88 97 90 78 84  Wt. (Lbs) 226.12 229.6    234.04 234.2  BMI 29.83 kg/m2 30.29 kg/m2    30.88 kg/m2 30.9 kg/m2       Tobacco abuse Asked:confirms currently smokes cigarettes, 1 PPD Assess: Unwilling to set a quit date, but is cutting back Advise: needs to QUIT to reduce risk of cancer, cardio and cerebrovascular disease Assist: counseled for 5 minutes and literature provided Arrange: follow up in 2 to 4 months   Type 2 diabetes mellitus with obesity (HCC) Controlled, no change in medication   Acute pancreatitis Symptomatically better, has GI appointment next week, rept amylase and lipase in am

## 2023-04-30 NOTE — Telephone Encounter (Signed)
Pt called back. Scheduled for 6/10. Aware will mail instructions/pre-op appt. Aware of medications to hold.  Per [employer]"Notification or Prior Authorization is not required for the requested services You are not required to submit a notification/prior authorization based on the information provided. If you have general questions about the prior authorization requirements, visit [email or web address] > Clinician Resources > Advance and Admission Notification Requirements. The number above acknowledges your notification. Please write this reference number down for future reference. If you would like to request an organization determination, please call us at [phone]. Decision ID #: [id number]"

## 2023-04-30 NOTE — Assessment & Plan Note (Signed)
Asked:confirms currently smokes cigarettes, 1 PPD Assess: Unwilling to set a quit date, but is cutting back Advise: needs to QUIT to reduce risk of cancer, cardio and cerebrovascular disease Assist: counseled for 5 minutes and literature provided Arrange: follow up in 2 to 4 months  

## 2023-04-30 NOTE — H&P (View-Only) (Signed)
 GI Office Note    Referring Provider: Simpson, Margaret E, MD Primary Care Physician:  Simpson, Margaret E, MD Primary Gastroenterologist: Charles K. Carver, DO  Date:  04/30/2023  ID:  Matthew Molina, DOB 07/29/1951, MRN [id number]   Chief Complaint   Chief Complaint  Patient presents with   Follow-up    Hospital follow up. Still having stomach problems and constipation    History of Present Illness  Alastair E Preusser is a 72 y.o. male with a history of depression, COPD, diabetes, ischemic colitis in 2021, HLD, HTN presenting today with complaint of upper abdominal pain and constipation.  EGD March 2017: -Esophageal stricture s/p dilation -Gastritis and duodenitis -Biopsies positive for H. Pylori  Initially treated for H. pylori with Prevpac and then subsequently Pylera.  Has had documented eradication.  Colonoscopy April 2021: -7 colon polyps -Pancolonic diverticulosis -External and internal hemorrhoids -Pathology revealed tubular adenomas -Recommend repeat colonoscopy in 3 years  CT chest 03/05/2023: -Moderate focal circumferential wall thickening of lower esophagus, recommended GI referral for endoscopic correlation to exclude esophageal malignancy -Lung RADS 2, benign appearance -Stable mildly dilated ascending thoracic aorta measuring 4.3 cm  Last seen in the office 03/15/2023 by Dr. Carver. Noted to have found distal esophageal wall thickening on CT chest for lung cancer screening.  Patient denies any dysphagia but does report chronic reflux which is well-controlled on pantoprazole 40 mg once daily.  Denies any family history of gastrointestinal cancer.  Did report taking Mobic daily for joint pains. Advised continue PPI daily.  Colonoscopy offered but patient declined.  Advised to schedule for EGD to further evaluate abnormal esophagus on CT scan.  Advised fatty liver diet for abnormal LFTs.  ED visit 5/5 for abdominal pain.  Brought in by EMS for epigastric and  suprapubic abdominal pain for 2-3 days with nausea and vomiting and decreased p.o. intake.  Reported similar symptoms once before that he stated was due to constipation.  Admitted to drinking at least 1 can of beer per day.  Abdominal exam without any peritoneal signs.  UA was negative for UTI, WBC 8.7, hemoglobin 13.7, creatinine 1.53, lipase 124.  CT with circumferential wall thickening and moderate enhancement of proximal duodenum with adjacent fat stranding with differentials including duodenitis or groove pancreatitis, circumferential wall thickening of distal esophagus which can be seen in the setting of esophagitis.  He was noted to have epigastric tenderness on exam.  He was recommended to follow a liquid diet and was given nausea and pain control to take at home and advised on small nonfatty meals to start in a few days.  He was discussed the importance of scheduling his upper endoscopy and patient reported he needed more time.  Advised to follow-up if symptoms worsened.  Today: When he eats food his stomach bothers him and he lays down and sips on sprite zero and that sometimes helps calm him down.Diet consists of soft foods, applesauce, few eggs. Staying away from fried foods. Drinking a lot of liquids. Denies vomiting but has had some nausea. Still taking pantoprazole once daily. Has used the zofran that was given from the ED, using it every 8 hours as needed.   Stopped taking B12 and now is only tacking a multivitamin.   Was drinking alcohol but stopped just before going to the ED and has not resumed.  Has been taking dulcolax or another over the counter laxative to help with constipation. Reports he is going to the bathroom a little bit everyday.   Denies straining. Denies melena or brbpr. Wants to try Linzess but states he was told it is helpful with constipation and wants to try it.   Denies dysphagia.    Current Outpatient Medications  Medication Sig Dispense Refill   ACCU-CHEK GUIDE  test strip USE AS DIRECTED TO CHECK BLOOD SUGAR ONCE DAILY. 50 strip 0   ACCU-CHEK GUIDE test strip USE TO CHECK BLOOD SUGAR ONCE DAILY 100 strip 0   Accu-Chek Softclix Lancets lancets TEST BLOOD SUGAR ONCE DAILY AS DIRECTED. 100 each 5   acetaminophen (TYLENOL) 500 MG tablet Take 500-1,000 mg by mouth as needed for headache. Takes mostly for HA about 2 x/week.     albuterol (VENTOLIN HFA) 108 (90 Base) MCG/ACT inhaler INHALE 2 PUFFS INTO THE LUNGS EVERY 6 HOURS AS NEEDED FOR WHEEZING OR SHORTNESS OF BREATH. 8.5 g 0   aspirin 81 MG EC tablet Take 81 mg by mouth daily. Swallow whole.     busPIRone (BUSPAR) 7.5 MG tablet TAKE (1) TABLET BY MOUTH THREE TIMES A DAY. (Patient taking differently: Take 7.5 mg by mouth 3 (three) times daily.) 90 tablet 0   carvedilol (COREG) 6.25 MG tablet Take 6.25 mg by mouth 2 (two) times daily with a meal.     FARXIGA 5 MG TABS tablet Take 5 mg by mouth every morning.     fenofibrate (TRICOR) 145 MG tablet TAKE 1 TABLET BY MOUTH DAILY. STOP TAKING EZETIMIBE. (Patient taking differently: Take 145 mg by mouth daily.) 30 tablet 0   glipiZIDE (GLUCOTROL XL) 10 MG 24 hr tablet TAKE (2) TABLETS BY MOUTH DAILY EVERY MORNING AT BREAKFAST. (Patient taking differently: Take 10 mg by mouth daily with breakfast.) 60 tablet 0   ipratropium-albuterol (DUONEB) 0.5-2.5 (3) MG/3ML SOLN INHALE 1 VIAL VIA NEBULIZER EVERY 4 TO 6 HOURS. 540 mL 0   Lancets 30G MISC Once daily testing dx e11.9 100 each 5   meloxicam (MOBIC) 7.5 MG tablet TAKE 1 TABLET BY MOUTH ONCE DAILY. 30 tablet 0   montelukast (SINGULAIR) 10 MG tablet Take 1 tablet (10 mg total) by mouth at bedtime. 30 tablet 3   Multiple Vitamins-Minerals (MULTIVITAMIN WITH MINERALS) tablet Take 1 tablet by mouth daily.     olmesartan (BENICAR) 40 MG tablet TAKE ONE TABLET BY MOUTH DAILY 90 tablet 0   ondansetron (ZOFRAN-ODT) 4 MG disintegrating tablet Take 1 tablet (4 mg total) by mouth every 8 (eight) hours as needed for nausea or  vomiting. 20 tablet 0   pantoprazole (PROTONIX) 40 MG tablet TAKE ONE TABLET BY MOUTH ONCE DAILY. 30 tablet 5   Polyethyl Glycol-Propyl Glycol (SYSTANE) 0.4-0.3 % GEL ophthalmic gel Place 1 Application into both eyes.     risperidone (RISPERDAL) 4 MG tablet TAKE ONE TABLET BY MOUTH IN THE EVENING. TAKE WITH 1MG TABLET. 90 tablet 0   rosuvastatin (CRESTOR) 10 MG tablet TAKE ONE TABLET BY MOUTH ONCE DAILY. 90 tablet 0   sucralfate (CARAFATE) 1 g tablet TAKE 1 TABLET BY MOUTH FOUR TIMES A DAY AS NEEDED. 30 tablet 0   SYMBICORT 160-4.5 MCG/ACT inhaler INHALE 2 PUFFS INTO THE LUNGS ONCE IN THE MORNING AND AT BEDTIME. (Patient taking differently: Inhale 2 puffs into the lungs 2 (two) times daily.) 10.2 g 0   tamsulosin (FLOMAX) 0.4 MG CAPS capsule Take 1 capsule (0.4 mg total) by mouth daily after supper. 90 capsule 3   traZODone (DESYREL) 50 MG tablet TAKE ONE TABLET BY MOUTH AT BEDTIME AS NEEDED FOR SLEEP. 90 tablet 0     triamterene-hydrochlorothiazide (MAXZIDE-25) 37.5-25 MG tablet TAKE (1/2) TABLET BY MOUTH ONCE DAILY. (Patient taking differently: Take 1 tablet by mouth See admin instructions. TAKE (1/2) TABLET BY MOUTH ONCE DAILY.) 45 tablet 0   docusate sodium (COLACE) 100 MG capsule Take 100 mg by mouth daily as needed for mild constipation.  (Patient not taking: Reported on 04/30/2023)     nicotine (NICODERM CQ - DOSED IN MG/24 HOURS) 14 mg/24hr patch Place 1 patch (14 mg total) onto the skin daily. (Patient not taking: Reported on 04/26/2023) 28 patch 1   nicotine (NICODERM CQ) 21 mg/24hr patch Place 1 patch (21 mg total) onto the skin daily. (Patient not taking: Reported on 04/26/2023) 28 patch 3   risperiDONE (RISPERDAL) 1 MG tablet TAKE ONE TABLET BY MOUTH IN THE EVENING. TAKE WITH 4MG TABLET. (Patient not taking: Reported on 04/30/2023) 90 tablet 0   vitamin B-12 (CYANOCOBALAMIN) 1000 MCG tablet Take 1,000 mcg by mouth daily. (Patient not taking: Reported on 04/26/2023)     No current  facility-administered medications for this visit.    Past Medical History:  Diagnosis Date   Acute blood loss anemia 01/05/2020   Acute kidney injury (HCC)    Acute respiratory failure with hypoxia (HCC)    Arthritis    COPD (chronic obstructive pulmonary disease) (HCC)    Depression    Diabetes mellitus    Diabetes mellitus without complication (HCC)    Diverticulitis    Elevated PSA 10/01/2016   Encounter for support and coordination of transition of care 01/14/2020   Hypercholesterolemia    Hyperlipidemia    Hypertension    Insomnia 01/05/2016   Ischemic colitis (HCC) 01/05/2020   Multiple lung nodules on CT 04/02/2015   Nicotine addiction    Obesity    Oxygen deficiency    qhs   Schizophrenia (HCC)     Past Surgical History:  Procedure Laterality Date   CATARACT EXTRACTION W/PHACO Left 11/20/2013   Procedure: CATARACT EXTRACTION PHACO AND INTRAOCULAR LENS PLACEMENT (IOC);  Surgeon: Kerry Hunt, MD;  Location: [facility];  Service: Ophthalmology;  Laterality: Left;  CDE:10.26   CATARACT EXTRACTION W/PHACO Right 12/08/2013   Procedure: RIGHT EYE CATARACT EXTRACTION PHACO AND INTRAOCULAR LENS PLACEMENT ;  Surgeon: Kerry Hunt, MD;  Location: [facility];  Service: Ophthalmology;  Laterality: Right;  CDE 12.38   COLONOSCOPY N/A 04/01/2013   pancolonic diverticulosis, redundant colon, large internal hemorrhoids.    COLONOSCOPY  2014   INCOMPLETE PREP IN R COLON   COLONOSCOPY N/A 04/01/2020   Procedure: COLONOSCOPY;  Surgeon: Fields, Sandi L, MD;  Location: [facility];  Service: Endoscopy;  Laterality: N/A;  8:45am   ESOPHAGOGASTRODUODENOSCOPY N/A 02/18/2016   stricture at GE junction, moderate erosive gastritis and mild non-erosive duodenitis. +H.pylori. Treated initially with Prevpac. Breath test to check for eradication was positive, and he was prescribed Pylera.    EYE SURGERY Left 11/2013   cataract extraction   POLYPECTOMY  04/01/2020   Procedure: POLYPECTOMY;  Surgeon: Fields, Sandi  L, MD;  Location: [facility];  Service: Endoscopy;;   SAVORY DILATION N/A 02/18/2016   Procedure: SAVORY DILATION;  Surgeon: Sandi L Fields, MD;  Location: [facility];  Service: Endoscopy;  Laterality: N/A;   SHOULDER SURGERY      Family History  Problem Relation Age of Onset   Diabetes Mother    Hypertension Mother    Stroke Mother    Diabetes Sister    Heart disease Sister    Kidney disease Father      Drug abuse Brother    Colon cancer Neg Hx    Colon polyps Neg Hx     Allergies as of 04/30/2023 - Review Complete 04/30/2023  Allergen Reaction Noted   Sertraline hcl  04/17/2011   Wellbutrin [bupropion] Other (See Comments) 07/29/2015    Social History   Socioeconomic History   Marital status: Divorced    Spouse name: Not on file   Number of children: 2   Years of education: Not on file   Highest education level: Not on file  Occupational History   Occupation: retired from [employer]   Tobacco Use   Smoking status: Every Day    Packs/day: 2.00    Years: 48.00    Additional pack years: 0.00    Total pack years: 96.00    Types: Cigarettes   Smokeless tobacco: Never  Vaping Use   Vaping Use: Never used  Substance and Sexual Activity   Alcohol use: Yes    Alcohol/week: 0.0 standard drinks of alcohol    Comment: "little bit of beer"   Drug use: No   Sexual activity: Not Currently    Birth control/protection: None  Other Topics Concern   Not on file  Social History Narrative   ** Merged History Encounter **       ** Merged History Encounter **       Social Determinants of Health   Financial Resource Strain: Low Risk  (12/13/2022)   Overall Financial Resource Strain (CARDIA)    Difficulty of Paying Living Expenses: Not very hard  Food Insecurity: No Food Insecurity (11/27/2022)   Hunger Vital Sign    Worried About Running Out of Food in the Last Year: Never true    Ran Out of Food in the Last Year: Never true  Transportation Needs: No Transportation Needs  (12/13/2022)   PRAPARE - Transportation    Lack of Transportation (Medical): No    Lack of Transportation (Non-Medical): No  Physical Activity: Inactive (11/27/2022)   Exercise Vital Sign    Days of Exercise per Week: 0 days    Minutes of Exercise per Session: 0 min  Stress: No Stress Concern Present (11/27/2022)   [employer] - Occupational Stress Questionnaire    Feeling of Stress : Not at all  Social Connections: Moderately Isolated (11/27/2022)   Social Connection and Isolation Panel [NHANES]    Frequency of Communication with Friends and Family: More than three times a week    Frequency of Social Gatherings with Friends and Family: More than three times a week    Attends Religious Services: More than 4 times per year    Active Member of Clubs or Organizations: No    Attends Club or Organization Meetings: Never    Marital Status: Divorced     Review of Systems   Gen: Denies fever, chills, anorexia. Denies fatigue, weakness, weight loss.  CV: Denies chest pain, palpitations, syncope, peripheral edema, and claudication. Resp: +dyspnea on exertion.  Denies cough, wheezing, coughing up blood, and pleurisy. GI: See HPI Derm: Denies rash, itching, dry skin Psych: Denies depression, anxiety, memory loss, confusion. No homicidal or suicidal ideation.  Heme: Denies bruising, bleeding, and enlarged lymph nodes.  Physical Exam   BP 124/78 (BP Location: Left Arm, Patient Position: Sitting, Cuff Size: Large)   Pulse 73   Temp 97.9 F (36.6 C) (Temporal)   Ht 6' 1" (1.854 m)   Wt 227 lb (103 kg)   SpO2 96%   BMI 29.95   kg/m   General:   Alert and oriented. No distress noted. Pleasant and cooperative.  Head:  Normocephalic and atraumatic. Eyes:  Conjuctiva clear without scleral icterus. Mouth:  Oral mucosa pink and moist. Good dentition. No lesions. Lungs:  Clear to auscultation bilaterally. No wheezes, rales, or rhonchi. No distress.  Heart:  S1, S2  present without murmurs appreciated.  Abdomen:  +BS, soft, non-tender and non-distended. No rebound or guarding. No HSM or masses noted. Rectal: deferred Msk:  Symmetrical without gross deformities. Normal posture. Extremities:  Without edema. Neurologic:  Alert and  oriented x4 Psych:  Alert and cooperative. Normal mood and affect.   Assessment  Pearson E Goetting is a 72 y.o. male with a history of depression, COPD, diabetes, ischemic colitis in 2021, HLD, HTN presenting today with complaint of upper abdominal pain and constipation.  Esophageal wall thickening on CT: CT chest in early March 2024 with distal esophageal wall thickening with recommendation to complete upper endoscopy to rule out malignancy.  Scheduled at his last office visit however patient called to reschedule and then canceled.  I reinforced the importance of completing upper endoscopy to evaluate his CT findings as well as to further evaluate his upper abdominal pain, reflux, and nausea.  GERD, nausea, Pancreatitis: Currently on pantoprazole 40 mg once daily.  Has occasional nausea and reports that certain foods bother him when he lays down but sometimes bright helps improve his symptoms.  Denies eating a high fat diet and has been avoiding fried foods.  Denies any prior alcohol use since his ED visit last week.  Most recent ED visit 5/5 for epigastric pain and was found to have elevated lipase and concerns for duodenitis or pancreatitis on CT scan.  Pancreatitis presumed to be secondary to alcohol use given only very mild elevation in triglycerides.  Was advised to follow a liquid diet and to slowly introduce nonfatty foods over the next few days.  He continues to have some upper abdominal discomfort and some mild tenderness on exam today.  Advised him to continue his current PPI dose, avoid alcohol, and follow a GERD and low-fat diet.  Advised he may decrease his diet to liquids for 24 hours if he begins to have any worsening  pain.  Adenomatous colon polyps: Colonoscopy in April 2021 with multiple tubular adenomas.  Recommended 3-year surveillance, currently overdue.  We again discussed today the importance of surveillance and offered at time of EGD.  Patient declined again stating he would like to wait until follow-up to discuss/consider this.  He indicates understanding that we could be missing a potential malignancy.  Abnormal LFTs, hepatic steatosis: History of chronically elevated LFTs.  Previous daily alcohol use and has other risk factors including diabetes, hyperlipidemia, and obesity.  Evidence of cholelithiasis on most recent CT abdomen pelvis but no biliary dilation. Labs 04/27/2023 with normalized LFTs.  Advised him to continue to avoid alcohol.  Constipation: History difficult to obtain.  Patient states he has been taking Dulcolax and other over-the-counter laxatives to help with constipation however reports he usually goes to the bathroom a little bit every day but denies hard stools or straining.  Request to trial Linzess due to feelings of constipation.  Will provide samples of Linzess 72 mcg today and sent in prescription.  Advised on washout period.   PLAN   Linzess 72 mcg daily, 30 minutes prior to breakfast. Samples provided Alcohol cessation Continue pantoprazole 40 mg once daily.  EGD ASA 3, Dr. Carver - New orders   placed today. Follow low-fat diet, may reduce to clear liquids if still having some upper abdominal discomfort Colonoscopy offered, patient declined. Follow up in 3 months    Edis Huish, MSN, FNP-BC, AGACNP-BC [facility] 

## 2023-04-30 NOTE — Telephone Encounter (Signed)
LMOVM to call back to schedule EGD with Dr. Marletta Lor, ASA 3, no glipizide morning of procedure, no Farxiga 3 days prior.

## 2023-05-01 ENCOUNTER — Encounter: Payer: Self-pay | Admitting: *Deleted

## 2023-05-02 ENCOUNTER — Other Ambulatory Visit: Payer: Self-pay

## 2023-05-02 DIAGNOSIS — J449 Chronic obstructive pulmonary disease, unspecified: Secondary | ICD-10-CM | POA: Diagnosis not present

## 2023-05-02 DIAGNOSIS — K859 Acute pancreatitis without necrosis or infection, unspecified: Secondary | ICD-10-CM

## 2023-05-03 LAB — SPECIMEN STATUS REPORT

## 2023-05-03 LAB — LIPASE: Lipase: 92 U/L — ABNORMAL HIGH (ref 13–78)

## 2023-05-03 LAB — AMYLASE: Amylase: 167 U/L — ABNORMAL HIGH (ref 31–110)

## 2023-05-07 ENCOUNTER — Other Ambulatory Visit: Payer: Self-pay | Admitting: Family Medicine

## 2023-05-12 ENCOUNTER — Other Ambulatory Visit: Payer: Self-pay | Admitting: Family Medicine

## 2023-05-18 ENCOUNTER — Other Ambulatory Visit: Payer: Self-pay | Admitting: Family Medicine

## 2023-05-18 DIAGNOSIS — J449 Chronic obstructive pulmonary disease, unspecified: Secondary | ICD-10-CM

## 2023-05-18 DIAGNOSIS — M25552 Pain in left hip: Secondary | ICD-10-CM

## 2023-05-24 ENCOUNTER — Ambulatory Visit: Payer: 59 | Admitting: Podiatry

## 2023-05-25 ENCOUNTER — Encounter (HOSPITAL_COMMUNITY)
Admission: RE | Admit: 2023-05-25 | Discharge: 2023-05-25 | Disposition: A | Discharge status: Home / Self Care | Payer: 59 | Source: Ambulatory Visit | Attending: Internal Medicine | Admitting: Internal Medicine

## 2023-05-25 ENCOUNTER — Other Ambulatory Visit: Payer: Self-pay

## 2023-05-25 ENCOUNTER — Encounter (HOSPITAL_COMMUNITY): Payer: Self-pay

## 2023-05-28 ENCOUNTER — Other Ambulatory Visit: Payer: Self-pay

## 2023-05-28 ENCOUNTER — Ambulatory Visit (HOSPITAL_COMMUNITY)
Admission: RE | Admit: 2023-05-28 | Discharge: 2023-05-28 | Disposition: A | Discharge status: Home / Self Care | Payer: 59 | Attending: Internal Medicine | Admitting: Internal Medicine

## 2023-05-28 ENCOUNTER — Encounter (HOSPITAL_COMMUNITY): Payer: Self-pay

## 2023-05-28 ENCOUNTER — Ambulatory Visit (HOSPITAL_BASED_OUTPATIENT_CLINIC_OR_DEPARTMENT_OTHER): Payer: 59 | Admitting: Anesthesiology

## 2023-05-28 ENCOUNTER — Encounter (HOSPITAL_COMMUNITY)
Admission: RE | Disposition: A | Discharge status: Home / Self Care | Payer: Self-pay | Source: Home / Self Care | Attending: Internal Medicine

## 2023-05-28 ENCOUNTER — Ambulatory Visit (HOSPITAL_COMMUNITY): Payer: 59 | Admitting: Anesthesiology

## 2023-05-28 DIAGNOSIS — F17200 Nicotine dependence, unspecified, uncomplicated: Secondary | ICD-10-CM | POA: Insufficient documentation

## 2023-05-28 DIAGNOSIS — K2289 Other specified disease of esophagus: Secondary | ICD-10-CM | POA: Insufficient documentation

## 2023-05-28 DIAGNOSIS — Z6829 Body mass index (BMI) 29.0-29.9, adult: Secondary | ICD-10-CM | POA: Diagnosis not present

## 2023-05-28 DIAGNOSIS — I444 Left anterior fascicular block: Secondary | ICD-10-CM | POA: Insufficient documentation

## 2023-05-28 DIAGNOSIS — R12 Heartburn: Secondary | ICD-10-CM | POA: Diagnosis present

## 2023-05-28 DIAGNOSIS — I700 Atherosclerosis of aorta: Secondary | ICD-10-CM | POA: Insufficient documentation

## 2023-05-28 DIAGNOSIS — J439 Emphysema, unspecified: Secondary | ICD-10-CM | POA: Insufficient documentation

## 2023-05-28 DIAGNOSIS — I10 Essential (primary) hypertension: Secondary | ICD-10-CM | POA: Insufficient documentation

## 2023-05-28 DIAGNOSIS — I2510 Atherosclerotic heart disease of native coronary artery without angina pectoris: Secondary | ICD-10-CM | POA: Diagnosis not present

## 2023-05-28 DIAGNOSIS — Z79899 Other long term (current) drug therapy: Secondary | ICD-10-CM | POA: Insufficient documentation

## 2023-05-28 DIAGNOSIS — E669 Obesity, unspecified: Secondary | ICD-10-CM | POA: Diagnosis not present

## 2023-05-28 DIAGNOSIS — E7800 Pure hypercholesterolemia, unspecified: Secondary | ICD-10-CM | POA: Insufficient documentation

## 2023-05-28 DIAGNOSIS — K2970 Gastritis, unspecified, without bleeding: Secondary | ICD-10-CM | POA: Diagnosis not present

## 2023-05-28 DIAGNOSIS — Z7951 Long term (current) use of inhaled steroids: Secondary | ICD-10-CM | POA: Diagnosis not present

## 2023-05-28 DIAGNOSIS — K2950 Unspecified chronic gastritis without bleeding: Secondary | ICD-10-CM | POA: Diagnosis not present

## 2023-05-28 DIAGNOSIS — K219 Gastro-esophageal reflux disease without esophagitis: Secondary | ICD-10-CM | POA: Insufficient documentation

## 2023-05-28 DIAGNOSIS — Z7984 Long term (current) use of oral hypoglycemic drugs: Secondary | ICD-10-CM | POA: Insufficient documentation

## 2023-05-28 DIAGNOSIS — J449 Chronic obstructive pulmonary disease, unspecified: Secondary | ICD-10-CM

## 2023-05-28 DIAGNOSIS — K3189 Other diseases of stomach and duodenum: Secondary | ICD-10-CM | POA: Diagnosis not present

## 2023-05-28 DIAGNOSIS — E119 Type 2 diabetes mellitus without complications: Secondary | ICD-10-CM | POA: Insufficient documentation

## 2023-05-28 DIAGNOSIS — F17210 Nicotine dependence, cigarettes, uncomplicated: Secondary | ICD-10-CM | POA: Diagnosis not present

## 2023-05-28 DIAGNOSIS — K449 Diaphragmatic hernia without obstruction or gangrene: Secondary | ICD-10-CM | POA: Insufficient documentation

## 2023-05-28 HISTORY — PX: ESOPHAGOGASTRODUODENOSCOPY (EGD) WITH PROPOFOL: SHX5813

## 2023-05-28 HISTORY — PX: BIOPSY: SHX5522

## 2023-05-28 LAB — GLUCOSE, CAPILLARY: Glucose-Capillary: 148 mg/dL — ABNORMAL HIGH (ref 70–99)

## 2023-05-28 SURGERY — ESOPHAGOGASTRODUODENOSCOPY (EGD) WITH PROPOFOL
Anesthesia: General

## 2023-05-28 MED ORDER — LACTATED RINGERS IV SOLN: INTRAVENOUS | Status: DC

## 2023-05-28 MED ORDER — PROPOFOL 10 MG/ML IV BOLUS
INTRAVENOUS | Status: DC | PRN
  Administered 2023-05-28 (×2): 30 mg via INTRAVENOUS
  Administered 2023-05-28: 80 mg via INTRAVENOUS

## 2023-05-28 MED ORDER — LIDOCAINE HCL (CARDIAC) PF 100 MG/5ML IV SOSY
PREFILLED_SYRINGE | INTRAVENOUS | Status: DC | PRN
  Administered 2023-05-28: 50 mg via INTRAVENOUS

## 2023-05-28 NOTE — Interval H&P Note (Signed)
History and Physical Interval Note:  05/28/2023 10:26 AM  Matthew Molina  has presented today for surgery, with the diagnosis of abnormal esophagus, nausea, gerd.  The various methods of treatment have been discussed with the patient and family. After consideration of risks, benefits and other options for treatment, the patient has consented to  Procedure(s) with comments: ESOPHAGOGASTRODUODENOSCOPY (EGD) WITH PROPOFOL (N/A) - 11:15am, asa 3 as a surgical intervention.  The patient's history has been reviewed, patient examined, no change in status, stable for surgery.  I have reviewed the patient's chart and labs.  Questions were answered to the patient's satisfaction.     Lanelle Bal

## 2023-05-28 NOTE — Anesthesia Postprocedure Evaluation (Signed)
Anesthesia Post Note  Patient: Matthew Molina  Procedure(s) Performed: ESOPHAGOGASTRODUODENOSCOPY (EGD) WITH PROPOFOL BIOPSY  Patient location during evaluation: Phase II Anesthesia Type: General Level of consciousness: awake and alert and oriented Pain management: pain level controlled Vital Signs Assessment: post-procedure vital signs reviewed and stable Respiratory status: spontaneous breathing, nonlabored ventilation and respiratory function stable Cardiovascular status: blood pressure returned to baseline and stable Postop Assessment: no apparent nausea or vomiting Anesthetic complications: no  No notable events documented.   Last Vitals:  Vitals:   05/28/23 0929 05/28/23 1047  BP: (!) 132/98 96/61  Pulse: 78 64  Resp: 19 18  Temp: 36.9 C 36.7 C  SpO2: 99% 97%    Last Pain:  Vitals:   05/28/23 1047  TempSrc: Oral  PainSc: 0-No pain                 Matthew Molina C Johm Pfannenstiel

## 2023-05-28 NOTE — Transfer of Care (Signed)
Immediate Anesthesia Transfer of Care Note  Patient: [name]  Procedure(s) Performed: ESOPHAGOGASTRODUODENOSCOPY (EGD) WITH PROPOFOL BIOPSY  Patient Location: Short Stay  Anesthesia Type:General  Level of Consciousness: drowsy  Airway & Oxygen Therapy: Patient Spontanous Breathing  Post-op Assessment: Report given to RN and Post -op Vital signs reviewed and stable  Post vital signs: Reviewed and stable  Last Vitals:  Vitals Value Taken Time  BP 96/61 05/28/23 1047  Temp 36.7 C 05/28/23 1047  Pulse 64 05/28/23 1047  Resp 18 05/28/23 1047  SpO2 97 % 05/28/23 1047    Last Pain:  Vitals:   05/28/23 1047  TempSrc: Oral  PainSc: 0-No pain         Complications: No notable events documented.

## 2023-05-28 NOTE — Anesthesia Preprocedure Evaluation (Addendum)
Anesthesia Evaluation  Patient identified by MRN, date of birth, ID band Patient awake    Reviewed: Allergy & Precautions, NPO status , Patient's Chart, lab work & pertinent test results, reviewed documented beta blocker date and time   Airway Mallampati: I  TM Distance: >3 FB Neck ROM: Full    Dental  (+) Edentulous Upper, Edentulous Lower   Pulmonary shortness of breath, COPD,  COPD inhaler, Current Smoker and Patient abstained from smoking. IMPRESSION: 1. Lung-RADS 2, benign appearance or behavior. Continue annual screening with low-dose chest CT without contrast in 12 months. 2. Stable mildly dilated ascending thoracic aorta, measuring up 4.3 cm. Recommend continued attention on annual lung cancer screening CT. 3. Severe left main and three-vessel coronary artery calcifications. 4. Aortic Atherosclerosis (ICD10-I70.0) and Emphysema (ICD10-J43.9).   Electronically Signed: By: Allegra Lai M.D. On: 03/06/2023 17:07      Pulmonary exam normal breath sounds clear to auscultation       Cardiovascular Exercise Tolerance: Poor hypertension, Pt. on medications and Pt. on home beta blockers + CAD and + DOE  Normal cardiovascular exam Rhythm:Regular Rate:Normal  22-Apr-2023 16:25:14 [facility] ROUTINE RECORD 14-Oct-1951 (72 yr) Male Black Vent. rate 90 BPM PR interval 182 ms QRS duration 97 ms QT/QTcB 378/463 ms P-R-T axes -17 -68 6 Sinus rhythm Left anterior fascicular block RSR' in V1 or V2, probably normal variant Left ventricular hypertrophy Anterior Q waves, possibly due to LVH Baseline wander in lead(s) V5 Confirmed by Vivi Barrack [phone]) on 04/22/2023 5:09:32 PM   Neuro/Psych  PSYCHIATRIC DISORDERS Anxiety Depression  Schizophrenia     GI/Hepatic ,GERD  Medicated and Controlled,,  Endo/Other  diabetes, Well Controlled, Type 2, Oral Hypoglycemic Agents    Renal/GU Renal  InsufficiencyRenal disease Bladder dysfunction (BPH)      Musculoskeletal  (+) Arthritis , Osteoarthritis,    Abdominal   Peds  Hematology  (+) Blood dyscrasia, anemia   Anesthesia Other Findings   Reproductive/Obstetrics                             Anesthesia Physical Anesthesia Plan  ASA: 3  Anesthesia Plan: General   Post-op Pain Management: Minimal or no pain anticipated   Induction: Intravenous  PONV Risk Score and Plan: 1 and Treatment may vary due to age or medical condition  Airway Management Planned: Nasal Cannula and Natural Airway  Additional Equipment:   Intra-op Plan:   Post-operative Plan:   Informed Consent: I have reviewed the patients History and Physical, chart, labs and discussed the procedure including the risks, benefits and alternatives for the proposed anesthesia with the patient or authorized representative who has indicated his/her understanding and acceptance.       Plan Discussed with: CRNA and Surgeon  Anesthesia Plan Comments:        Anesthesia Quick Evaluation

## 2023-05-28 NOTE — Discharge Instructions (Signed)
EGD Discharge instructions Please read the instructions outlined below and refer to this sheet in the next few weeks. These discharge instructions provide you with general information on caring for yourself after you leave the hospital. Your doctor may also give you specific instructions. While your treatment has been planned according to the most current medical practices available, unavoidable complications occasionally occur. If you have any problems or questions after discharge, please call your doctor. ACTIVITY You may resume your regular activity but move at a slower pace for the next 24 hours.  Take frequent rest periods for the next 24 hours.  Walking will help expel (get rid of) the air and reduce the bloated feeling in your abdomen.  No driving for 24 hours (because of the anesthesia (medicine) used during the test).  You may shower.  Do not sign any important legal documents or operate any machinery for 24 hours (because of the anesthesia used during the test).  NUTRITION Drink plenty of fluids.  You may resume your normal diet.  Begin with a light meal and progress to your normal diet.  Avoid alcoholic beverages for 24 hours or as instructed by your caregiver.  MEDICATIONS You may resume your normal medications unless your caregiver tells you otherwise.  WHAT YOU CAN EXPECT TODAY You may experience abdominal discomfort such as a feeling of fullness or "gas" pains.  FOLLOW-UP Your doctor will discuss the results of your test with you.  SEEK IMMEDIATE MEDICAL ATTENTION IF ANY OF THE FOLLOWING OCCUR: Excessive nausea (feeling sick to your stomach) and/or vomiting.  Severe abdominal pain and distention (swelling).  Trouble swallowing.  Temperature over 101 F (37.8 C).  Rectal bleeding or vomiting of blood.    Your EGD revealed moderate amount inflammation in your stomach.  I took biopsies of this to rule out infection with a bacteria called H. pylori.  Await pathology results,  my office will contact you.  I also took samples of your esophagus. I did not see any evidence of esophageal cancer on today's exam.  Follow up in GI office in 2 months   I hope you have a great rest of your week!  Hennie Duos. Marletta Lor, D.O. Gastroenterology and Hepatology [facility]

## 2023-05-28 NOTE — Op Note (Signed)
[facility] Patient Name: Matthew Molina Procedure Date: 05/28/2023 10:26 AM MRN: [id number] Date of Birth: Sep 08, 1951 Attending MD: Hennie Duos. Marletta Lor , [place], [id number] CSN: [id number] Age: 72 Admit Type: Outpatient Procedure:                Upper GI endoscopy Indications:              Heartburn, Abnormal CT of the GI tract, Nausea Providers:                Hennie Duos. Marletta Lor, DO, Crystal Page, Lennice Sites                            Technician, Technician Referring MD:              Medicines:                See the Anesthesia note for documentation of the                            administered medications Complications:            No immediate complications. Estimated Blood Loss:     Estimated blood loss was minimal. Procedure:                Pre-Anesthesia Assessment:                           - The anesthesia plan was to use monitored                            anesthesia care (MAC).                           After obtaining informed consent, the endoscope was                            passed under direct vision. Throughout the                            procedure, the patient's blood pressure, pulse, and                            oxygen saturations were monitored continuously. The                            GIF-H190 ([id number]) scope was introduced through the                            mouth, and advanced to the second part of duodenum.                            The upper GI endoscopy was accomplished without                            difficulty. The patient tolerated the procedure  well. Scope In: 10:34:59 AM Scope Out: 10:43:43 AM Total Procedure Duration: 0 hours 8 minutes 44 seconds  Findings:      The Z-line was variable. Biopsies were taken with a cold forceps for       histology.      Diffuse moderate inflammation characterized by erosions and erythema was       found in the entire examined stomach. Biopsies were taken with a cold        forceps for Helicobacter pylori testing.      A small hiatal hernia was present.      Exaggerated J shaped stomach      The duodenal bulb, first portion of the duodenum and second portion of       the duodenum were normal. Impression:               - Z-line variable. Biopsied.                           - Gastritis. Biopsied.                           - Small hiatal hernia.                           - Normal duodenal bulb, first portion of the                            duodenum and second portion of the duodenum. Moderate Sedation:      Per Anesthesia Care Recommendation:           - Patient has a contact number available for                            emergencies. The signs and symptoms of potential                            delayed complications were discussed with the                            patient. Return to normal activities tomorrow.                            Written discharge instructions were provided to the                            patient.                           - Resume previous diet.                           - Continue present medications.                           - Await pathology results.                           - Return to GI clinic in 2 months. Procedure Code(s):        ---  Professional ---                           [phone], Esophagogastroduodenoscopy, flexible,                            transoral; with biopsy, single or multiple Diagnosis Code(s):        --- Professional ---                           K22.89, Other specified disease of esophagus                           K29.70, Gastritis, unspecified, without bleeding                           K44.9, Diaphragmatic hernia without obstruction or                            gangrene                           R12, Heartburn                           R11.0, Nausea                           R93.3, Abnormal findings on diagnostic imaging of                            other parts of digestive tract CPT  copyright 2022 American Medical Association. All rights reserved. The codes documented in this report are preliminary and upon coder review may  be revised to meet current compliance requirements. Hennie Duos. Marletta Lor, DO Hennie Duos. Marletta Lor, DO 05/28/2023 10:47:16 AM This report has been signed electronically. Number of Addenda: 0

## 2023-05-29 LAB — SURGICAL PATHOLOGY

## 2023-06-02 DIAGNOSIS — J449 Chronic obstructive pulmonary disease, unspecified: Secondary | ICD-10-CM | POA: Diagnosis not present

## 2023-06-04 ENCOUNTER — Encounter: Payer: Self-pay | Admitting: Podiatry

## 2023-06-04 ENCOUNTER — Ambulatory Visit (INDEPENDENT_AMBULATORY_CARE_PROVIDER_SITE_OTHER): Payer: 59 | Admitting: Podiatry

## 2023-06-04 DIAGNOSIS — M2141 Flat foot [pes planus] (acquired), right foot: Secondary | ICD-10-CM

## 2023-06-04 DIAGNOSIS — Q828 Other specified congenital malformations of skin: Secondary | ICD-10-CM | POA: Diagnosis not present

## 2023-06-04 DIAGNOSIS — M79676 Pain in unspecified toe(s): Secondary | ICD-10-CM | POA: Diagnosis not present

## 2023-06-04 DIAGNOSIS — B351 Tinea unguium: Secondary | ICD-10-CM | POA: Diagnosis not present

## 2023-06-04 DIAGNOSIS — E1149 Type 2 diabetes mellitus with other diabetic neurological complication: Secondary | ICD-10-CM

## 2023-06-04 DIAGNOSIS — M2142 Flat foot [pes planus] (acquired), left foot: Secondary | ICD-10-CM | POA: Diagnosis not present

## 2023-06-04 DIAGNOSIS — E1140 Type 2 diabetes mellitus with diabetic neuropathy, unspecified: Secondary | ICD-10-CM

## 2023-06-04 NOTE — Progress Notes (Signed)
This patient returns to my office for at risk foot care.  This patient requires this care by a professional since this patient will be at risk due to having diabetes mellitus and chronic kidney disease.  Patient has pain in the two calluses on the bottom outside of his feet.  This patient is unable to cut nails himself since the patient cannot reach his nails.These nails are painful walking and wearing shoes. He cannot treat the painful calluses himself. . This patient presents for at risk foot care today.  General Appearance  Alert, conversant and in no acute stress.  Vascular  Dorsalis pedis and posterior tibial  pulses are palpable  bilaterally.  Capillary return is within normal limits  bilaterally. Temperature is within normal limits  bilaterally.  Neurologic  Senn-Weinstein monofilament wire test diminished   bilaterally. Muscle power within normal limits bilaterally.  Nails Thick disfigured discolored nails with subungual debris  from hallux to fifth toes bilaterally. No evidence of bacterial infection or drainage bilaterally.  Orthopedic  No limitations of motion  feet .  No crepitus or effusions noted.  No bony pathology or digital deformities noted.  Skin  normotropic skin  noted bilaterally.  No signs of infections or ulcers noted.   Porokeratosis sub 5th metabase  B/L.  Onychomycosis  Pain in right toes  Pain in left toes  Porokeratosis  X 2.  Consent was obtained for treatment procedures.   Mechanical debridement of nails 1-5  bilaterally performed with a nail nipper.  Filed with dremel without incident. Debridement of porokeratosis  with # 15 blade and dremel tool.     Return office visit   3 months                  Told patient to return for periodic foot care and evaluation due to potential at risk complications.   Dieter Hane DPM  

## 2023-06-05 ENCOUNTER — Encounter (HOSPITAL_COMMUNITY): Payer: Self-pay | Admitting: Internal Medicine

## 2023-06-07 ENCOUNTER — Telehealth: Payer: Self-pay | Admitting: Family Medicine

## 2023-06-07 NOTE — Telephone Encounter (Signed)
Prescription Request  06/07/2023  LOV: 04/26/2023  What is the name of the medication or equipment? busPIRone (BUSPAR) 7.5 MG tablet [[id number]]    Have you contacted your pharmacy to request a refill? Yes   Which pharmacy would you like this sent to?  [facility] - [place] - [address]  [place] [address] Phone: [phone] Fax: [phone]    Patient notified that their request is being sent to the clinical staff for review and that they should receive a response within 2 business days.   Please advise at [facility] [phone]

## 2023-06-08 ENCOUNTER — Other Ambulatory Visit: Payer: Self-pay | Admitting: Family Medicine

## 2023-06-08 MED ORDER — BUSPIRONE HCL 7.5 MG PO TABS: ORAL_TABLET | ORAL | 5 refills | Status: DC

## 2023-06-15 ENCOUNTER — Other Ambulatory Visit: Payer: Self-pay | Admitting: Family Medicine

## 2023-06-16 ENCOUNTER — Emergency Department (HOSPITAL_COMMUNITY): Payer: 59

## 2023-06-16 ENCOUNTER — Other Ambulatory Visit: Payer: Self-pay

## 2023-06-16 ENCOUNTER — Emergency Department (HOSPITAL_COMMUNITY)
Admission: EM | Admit: 2023-06-16 | Discharge: 2023-06-16 | Disposition: A | Discharge status: Home / Self Care | Payer: 59 | Attending: Emergency Medicine | Admitting: Emergency Medicine

## 2023-06-16 ENCOUNTER — Encounter (HOSPITAL_COMMUNITY): Payer: Self-pay

## 2023-06-16 DIAGNOSIS — N281 Cyst of kidney, acquired: Secondary | ICD-10-CM | POA: Diagnosis not present

## 2023-06-16 DIAGNOSIS — R1013 Epigastric pain: Secondary | ICD-10-CM

## 2023-06-16 DIAGNOSIS — Z7984 Long term (current) use of oral hypoglycemic drugs: Secondary | ICD-10-CM | POA: Insufficient documentation

## 2023-06-16 DIAGNOSIS — E876 Hypokalemia: Secondary | ICD-10-CM | POA: Diagnosis not present

## 2023-06-16 DIAGNOSIS — I10 Essential (primary) hypertension: Secondary | ICD-10-CM | POA: Insufficient documentation

## 2023-06-16 DIAGNOSIS — Z7982 Long term (current) use of aspirin: Secondary | ICD-10-CM | POA: Diagnosis not present

## 2023-06-16 DIAGNOSIS — Z79899 Other long term (current) drug therapy: Secondary | ICD-10-CM | POA: Insufficient documentation

## 2023-06-16 DIAGNOSIS — K2100 Gastro-esophageal reflux disease with esophagitis, without bleeding: Secondary | ICD-10-CM | POA: Insufficient documentation

## 2023-06-16 DIAGNOSIS — E119 Type 2 diabetes mellitus without complications: Secondary | ICD-10-CM | POA: Insufficient documentation

## 2023-06-16 DIAGNOSIS — J449 Chronic obstructive pulmonary disease, unspecified: Secondary | ICD-10-CM | POA: Diagnosis not present

## 2023-06-16 DIAGNOSIS — Z743 Need for continuous supervision: Secondary | ICD-10-CM | POA: Diagnosis not present

## 2023-06-16 DIAGNOSIS — R109 Unspecified abdominal pain: Secondary | ICD-10-CM | POA: Diagnosis not present

## 2023-06-16 DIAGNOSIS — K5730 Diverticulosis of large intestine without perforation or abscess without bleeding: Secondary | ICD-10-CM | POA: Diagnosis not present

## 2023-06-16 DIAGNOSIS — K449 Diaphragmatic hernia without obstruction or gangrene: Secondary | ICD-10-CM | POA: Diagnosis not present

## 2023-06-16 DIAGNOSIS — K219 Gastro-esophageal reflux disease without esophagitis: Secondary | ICD-10-CM

## 2023-06-16 LAB — CBC WITH DIFFERENTIAL/PLATELET
Abs Immature Granulocytes: 0.01 10*3/uL (ref 0.00–0.07)
Basophils Absolute: 0 10*3/uL (ref 0.0–0.1)
Basophils Relative: 0 %
Eosinophils Absolute: 0.4 10*3/uL (ref 0.0–0.5)
Eosinophils Relative: 5 %
HCT: 42.6 % (ref 39.0–52.0)
Hemoglobin: 13.8 g/dL (ref 13.0–17.0)
Immature Granulocytes: 0 %
Lymphocytes Relative: 25 %
Lymphs Abs: 1.9 10*3/uL (ref 0.7–4.0)
MCH: 29.1 pg (ref 26.0–34.0)
MCHC: 32.4 g/dL (ref 30.0–36.0)
MCV: 89.7 fL (ref 80.0–100.0)
Monocytes Absolute: 0.6 10*3/uL (ref 0.1–1.0)
Monocytes Relative: 8 %
Neutro Abs: 4.5 10*3/uL (ref 1.7–7.7)
Neutrophils Relative %: 62 %
Platelets: 212 10*3/uL (ref 150–400)
RBC: 4.75 MIL/uL (ref 4.22–5.81)
RDW: 14.7 % (ref 11.5–15.5)
WBC: 7.3 10*3/uL (ref 4.0–10.5)
nRBC: 0 % (ref 0.0–0.2)

## 2023-06-16 LAB — URINALYSIS, ROUTINE W REFLEX MICROSCOPIC
Bacteria, UA: NONE SEEN
Bilirubin Urine: NEGATIVE
Glucose, UA: >=500 mg/dL — AB
Hgb urine dipstick: NEGATIVE
Ketones, ur: NEGATIVE mg/dL
Leukocytes,Ua: NEGATIVE
Nitrite: NEGATIVE
Protein, ur: NEGATIVE mg/dL
Specific Gravity, Urine: 1.022 (ref 1.005–1.030)
pH: 7 (ref 5.0–8.0)

## 2023-06-16 LAB — COMPREHENSIVE METABOLIC PANEL
ALT: 26 U/L (ref 0–44)
AST: 24 U/L (ref 15–41)
Albumin: 4.4 g/dL (ref 3.5–5.0)
Alkaline Phosphatase: 28 U/L — ABNORMAL LOW (ref 38–126)
Anion gap: 11 (ref 5–15)
BUN: 19 mg/dL (ref 8–23)
CO2: 26 mmol/L (ref 22–32)
Calcium: 10.5 mg/dL — ABNORMAL HIGH (ref 8.9–10.3)
Chloride: 96 mmol/L — ABNORMAL LOW (ref 98–111)
Creatinine, Ser: 1.54 mg/dL — ABNORMAL HIGH (ref 0.61–1.24)
GFR, Estimated: 47 mL/min — ABNORMAL LOW (ref >60)
Glucose, Bld: 88 mg/dL (ref 70–99)
Potassium: 3.2 mmol/L — ABNORMAL LOW (ref 3.5–5.1)
Sodium: 133 mmol/L — ABNORMAL LOW (ref 135–145)
Total Bilirubin: 0.9 mg/dL (ref 0.3–1.2)
Total Protein: 8 g/dL (ref 6.5–8.1)

## 2023-06-16 LAB — LIPASE, BLOOD: Lipase: 103 U/L — ABNORMAL HIGH (ref 11–51)

## 2023-06-16 LAB — MAGNESIUM: Magnesium: 1.8 mg/dL (ref 1.7–2.4)

## 2023-06-16 MED ORDER — SUCRALFATE 1 GM/10ML PO SUSP: 1.0000 g | Freq: Three times a day (TID) | ORAL | 0 refills | Status: DC

## 2023-06-16 MED ORDER — POTASSIUM CHLORIDE CRYS ER 20 MEQ PO TBCR
40.0000 meq | EXTENDED_RELEASE_TABLET | Freq: Once | ORAL | Status: AC
  Administered 2023-06-16: 40 meq via ORAL
  Filled 2023-06-16: qty 2

## 2023-06-16 MED ORDER — IOHEXOL 300 MG/ML  SOLN
100.0000 mL | Freq: Once | INTRAMUSCULAR | Status: AC | PRN
  Administered 2023-06-16: 100 mL via INTRAVENOUS

## 2023-06-16 MED ORDER — SODIUM CHLORIDE 0.9 % IV BOLUS
500.0000 mL | Freq: Once | INTRAVENOUS | Status: AC
  Administered 2023-06-16: 500 mL via INTRAVENOUS

## 2023-06-16 MED ORDER — ALUM & MAG HYDROXIDE-SIMETH 200-200-20 MG/5ML PO SUSP
30.0000 mL | Freq: Once | ORAL | Status: AC
  Administered 2023-06-16: 30 mL via ORAL
  Filled 2023-06-16: qty 30

## 2023-06-16 MED ORDER — FAMOTIDINE IN NACL 20-0.9 MG/50ML-% IV SOLN
20.0000 mg | Freq: Once | INTRAVENOUS | Status: AC
  Administered 2023-06-16: 20 mg via INTRAVENOUS
  Filled 2023-06-16: qty 50

## 2023-06-16 MED ORDER — POTASSIUM CHLORIDE 10 MEQ/100ML IV SOLN
10.0000 meq | Freq: Once | INTRAVENOUS | Status: AC
  Administered 2023-06-16: 10 meq via INTRAVENOUS
  Filled 2023-06-16: qty 100

## 2023-06-16 MED ORDER — ONDANSETRON 4 MG PO TBDP: 4.0000 mg | ORAL_TABLET | Freq: Three times a day (TID) | ORAL | 0 refills | Status: DC | PRN

## 2023-06-16 NOTE — ED Triage Notes (Signed)
Patient presents to ED via [facility], c/o abdominal pain for 2 days. Pain starts to mid-abdomen and projecting to right lower quadrant. EMS reports pain worse when abdomen palpated. Reports episodes of nausea but denies vomiting . Last BM on last night. C/o shortness of breath, hx of COPD

## 2023-06-16 NOTE — ED Notes (Signed)
Aware of need for urine sample, urinal at bedside. Unable to provide at this time. Will reassess after fluid bolus

## 2023-06-16 NOTE — ED Provider Notes (Signed)
[place] EMERGENCY DEPARTMENT AT [facility] Provider Note   CSN: [id number] Arrival date & time: 06/16/23  1814     History  Chief Complaint  Patient presents with   Abdominal Pain    Matthew Molina is a 72 y.o. male.  Pt is a 72 yo male with pmhx significant for schizophrenia, hld, obesity, dm, depression, copd with prn oxygen at night, htn, hld, ischemic colitis, and diverticulitis.  Pt said he has been having abd pain for 1 or 2 days.  He denies f/c.  No constipation or diarrhea.  No n/v.  No urinary sx.  EGD done on 6/10 showed no h. Pylori and no Barrett's esophagus.  Plan was to continue current meds.       Home Medications Prior to Admission medications   Medication Sig Start Date End Date Taking? Authorizing Provider  ondansetron (ZOFRAN-ODT) 4 MG disintegrating tablet Take 1 tablet (4 mg total) by mouth every 8 (eight) hours as needed. 06/16/23  Yes Jacalyn Lefevre, MD  sucralfate (CARAFATE) 1 GM/10ML suspension Take 10 mLs (1 g total) by mouth 4 (four) times daily -  with meals and at bedtime. 06/16/23  Yes Jacalyn Lefevre, MD  ACCU-CHEK GUIDE test strip USE AS DIRECTED TO CHECK BLOOD SUGAR ONCE DAILY. 11/18/21   Kerri Perches, MD  ACCU-CHEK GUIDE test strip USE TO CHECK BLOOD SUGAR ONCE DAILY 04/02/23   Kerri Perches, MD  Accu-Chek Softclix Lancets lancets TEST BLOOD SUGAR ONCE DAILY AS DIRECTED. 07/07/19   Kerri Perches, MD  acetaminophen (TYLENOL) 500 MG tablet Take 500-1,000 mg by mouth every 6 (six) hours as needed for headache.    [provider]  albuterol (VENTOLIN HFA) 108 (90 Base) MCG/ACT inhaler INHALE 2 PUFFS INTO THE LUNGS EVERY 6 HOURS AS NEEDED FOR WHEEZING OR SHORTNESS OF BREATH. 05/18/23   Kerri Perches, MD  aspirin 81 MG EC tablet Take 81 mg by mouth daily. Swallow whole.    [provider]  busPIRone (BUSPAR) 7.5 MG tablet TAKE (1) TABLET BY MOUTH THREE TIMES A DAY. 06/08/23   Kerri Perches, MD   calcium carbonate (TUMS - DOSED IN MG ELEMENTAL CALCIUM) 500 MG chewable tablet Chew 1 tablet by mouth daily as needed for indigestion or heartburn.    [provider]  carvedilol (COREG) 6.25 MG tablet Take 6.25 mg by mouth 2 (two) times daily with a meal.    [provider]  docusate sodium (COLACE) 100 MG capsule Take 100 mg by mouth daily.    [provider]  FARXIGA 5 MG TABS tablet Take 5 mg by mouth every morning. 07/27/22   [provider]  fenofibrate (TRICOR) 145 MG tablet TAKE 1 TABLET BY MOUTH DAILY. STOP TAKING EZETIMIBE. 06/15/23   Kerri Perches, MD  glipiZIDE (GLUCOTROL XL) 10 MG 24 hr tablet TAKE (2) TABLETS BY MOUTH DAILY EVERY MORNING AT BREAKFAST. Patient taking differently: Take 10 mg by mouth daily with breakfast. TAKE (2) TABLETS BY MOUTH DAILY EVERY MORNING AT BREAKFAST. 05/18/23   Kerri Perches, MD  ipratropium-albuterol (DUONEB) 0.5-2.5 (3) MG/3ML SOLN INHALE 1 VIAL VIA NEBULIZER EVERY 4 TO 6 HOURS. Patient taking differently: Inhale 3 mLs into the lungs every 6 (six) hours as needed (shortness of breath or wheezing). 05/07/23   Kerri Perches, MD  Lancets 30G MISC Once daily testing dx e11.9 11/25/21   Donell Beers, FNP  linaclotide [facility]) 72 MCG capsule Take 1 capsule (72  mcg total) by mouth daily before breakfast. Patient taking differently: Take 72 mcg by mouth daily as needed (constipation). 04/30/23   Aida Raider, NP  meloxicam (MOBIC) 7.5 MG tablet TAKE 1 TABLET BY MOUTH ONCE DAILY. 05/18/23   Kerri Perches, MD  montelukast (SINGULAIR) 10 MG tablet Take 1 tablet (10 mg total) by mouth at bedtime. 03/28/23   Kerri Perches, MD  Multiple Vitamins-Minerals (MULTIVITAMIN WITH MINERALS) tablet Take 1 tablet by mouth daily.    [provider]  nicotine (NICODERM CQ - DOSED IN MG/24 HOURS) 14 mg/24hr patch Place 1 patch (14 mg total) onto the skin daily. Patient not taking: Reported on 04/26/2023  12/04/22   Kerri Perches, MD  nicotine (NICODERM CQ) 21 mg/24hr patch Place 1 patch (21 mg total) onto the skin daily. Patient not taking: Reported on 04/26/2023 03/28/23   Kerri Perches, MD  olmesartan [facility]) 40 MG tablet TAKE ONE TABLET BY MOUTH DAILY 03/29/23   Kerri Perches, MD  ondansetron (ZOFRAN-ODT) 4 MG disintegrating tablet Take 1 tablet (4 mg total) by mouth every 8 (eight) hours as needed for nausea or vomiting. 04/22/23   Loetta Rough, MD  pantoprazole (PROTONIX) 40 MG tablet TAKE ONE TABLET BY MOUTH ONCE DAILY. 01/16/23   Tiffany Kocher, PA-C  Polyethyl Glycol-Propyl Glycol 0.4-0.3 % SOLN Place 1 Application into both eyes as needed (dry eyes).    [provider]  risperiDONE (RISPERDAL) 1 MG tablet Take 1 mg by mouth at bedtime.    [provider]  risperidone (RISPERDAL) 4 MG tablet TAKE ONE TABLET BY MOUTH IN THE EVENING. TAKE WITH 1MG  TABLET. 04/16/23   Kerri Perches, MD  rosuvastatin (CRESTOR) 10 MG tablet TAKE ONE TABLET BY MOUTH ONCE DAILY. 04/16/23   Pricilla Riffle, MD  SYMBICORT 160-4.5 MCG/ACT inhaler INHALE 2 PUFFS INTO THE LUNGS ONCE IN THE MORNING AND AT BEDTIME. 05/18/23   Kerri Perches, MD  tamsulosin (FLOMAX) 0.4 MG CAPS capsule Take 1 capsule (0.4 mg total) by mouth daily after supper. 06/26/22   McKenzie, Mardene Celeste, MD  traZODone (DESYREL) 50 MG tablet TAKE ONE TABLET BY MOUTH AT BEDTIME AS NEEDED FOR SLEEP. 04/16/23   Kerri Perches, MD  triamterene-hydrochlorothiazide (MAXZIDE-25) 37.5-25 MG tablet TAKE (1/2) TABLET BY MOUTH ONCE DAILY. 05/15/23   Kerri Perches, MD  sildenafil (VIAGRA) 100 MG tablet Take 100 mg by mouth. Take one tablet 30 mins before intercourse   03/11/12  [provider]      Allergies    Sertraline hcl and Wellbutrin [bupropion]    Review of Systems   Review of Systems  Gastrointestinal:  Positive for abdominal pain.  All other systems reviewed and are negative.   Physical  Exam Updated Vital Signs BP 136/88   Pulse 95   Temp 98.9 F (37.2 C) (Oral)   Resp 20   Ht 6\' 1"  (1.854 m)   Wt 101.2 kg   SpO2 93%   BMI 29.42 kg/m  Physical Exam Vitals and nursing note reviewed.  Constitutional:      Appearance: He is well-developed.  HENT:     Head: Normocephalic and atraumatic.     Mouth/Throat:     Mouth: Mucous membranes are moist.     Pharynx: Oropharynx is clear.  Eyes:     Extraocular Movements: Extraocular movements intact.     Pupils: Pupils are equal, round, and reactive to light.  Cardiovascular:     Rate  and Rhythm: Normal rate and regular rhythm.     Heart sounds: Normal heart sounds.  Pulmonary:     Effort: Pulmonary effort is normal.     Breath sounds: Normal breath sounds.  Abdominal:     General: Abdomen is flat. Bowel sounds are normal.     Palpations: Abdomen is soft.     Tenderness: There is generalized abdominal tenderness.  Skin:    General: Skin is warm.     Capillary Refill: Capillary refill takes less than 2 seconds.  Neurological:     General: No focal deficit present.     Mental Status: He is alert and oriented to person, place, and time.  Psychiatric:        Mood and Affect: Mood normal.        Behavior: Behavior normal.     ED Results / Procedures / Treatments   Labs (all labs ordered are listed, but only abnormal results are displayed) Labs Reviewed  COMPREHENSIVE METABOLIC PANEL - Abnormal; Notable for the following components:      Result Value   Sodium 133 (*)    Potassium 3.2 (*)    Chloride 96 (*)    Creatinine, Ser 1.54 (*)    Calcium 10.5 (*)    Alkaline Phosphatase 28 (*)    GFR, Estimated 47 (*)    All other components within normal limits  LIPASE, BLOOD - Abnormal; Notable for the following components:   Lipase 103 (*)    All other components within normal limits  URINALYSIS, ROUTINE W REFLEX MICROSCOPIC - Abnormal; Notable for the following components:   Glucose, UA >=500 (*)    All other  components within normal limits  CBC WITH DIFFERENTIAL/PLATELET  MAGNESIUM    EKG None  Radiology CT ABDOMEN PELVIS W CONTRAST  Result Date: 06/16/2023 CLINICAL DATA:  Acute abdominal pain for 2 days projecting into the right lower quadrant. EXAM: CT ABDOMEN AND PELVIS WITH CONTRAST TECHNIQUE: Multidetector CT imaging of the abdomen and pelvis was performed using the standard protocol following bolus administration of intravenous contrast. RADIATION DOSE REDUCTION: This exam was performed according to the departmental dose-optimization program which includes automated exposure control, adjustment of the mA and/or kV according to patient size and/or use of iterative reconstruction technique. CONTRAST:  OMNIPAQUE IOHEXOL 300 MG/ML  SOLN COMPARISON:  04/22/2023 FINDINGS: Lower chest: Lung bases are free of acute infiltrate or sizable effusion. Persistent circumferential thickening of the distal esophageal wall is noted likely related to reflux. Small hiatal hernia is again noted Hepatobiliary: No focal liver abnormality is seen. No gallstones, gallbladder wall thickening, or biliary dilatation. Pancreas: Unremarkable. No pancreatic ductal dilatation or surrounding inflammatory changes. Spleen: Normal in size without focal abnormality. Adrenals/Urinary Tract: Adrenal glands are somewhat nodular similar to that seen on the prior exam. Kidneys demonstrate no renal calculi. Multiple simple renal cysts are again seen and stable. No further follow-up is recommended. No obstructive changes are seen. The bladder is well distended. Stomach/Bowel: Scattered diverticular change of the colon is noted. No evidence of diverticulitis is seen. The appendix is within normal limits. Stomach again shows small hiatal hernia. The second and third portion of the duodenum again demonstrates circumferential thickening and is somewhat mottled attenuation pattern. Vascular/Lymphatic: Aortic atherosclerosis. No enlarged  abdominal or pelvic lymph nodes. Reproductive: Prostate is unremarkable. Other: No abdominal wall hernia or abnormality. No abdominopelvic ascites. Musculoskeletal: Degenerative anterolisthesis of L4 on L5 is noted. IMPRESSION: Persistent thickening of the distal esophageal wall likely related  to reflux. Tiny hiatal hernia is again seen. Persistent wall thickening and mottled appearance of the second and third portions of the duodenum. The overall appearance is similar to that seen on the prior exam. Interval endoscopy showed no focal abnormality in this region in these changes are likely related degree of groove pancreatitis given the elevated lipase on today's exam as well as at the time of the previous study. Diverticular change without diverticulitis. Electronically Signed   By: Alcide Clever M.D.   On: 06/16/2023 19:50    Procedures Procedures    Medications Ordered in ED Medications  potassium chloride 10 mEq in 100 mL IVPB (10 mEq Intravenous New Bag/Given 06/16/23 1945)  potassium chloride SA (KLOR-CON M) CR tablet 40 mEq (has no administration in time range)  sodium chloride 0.9 % bolus 500 mL (500 mLs Intravenous New Bag/Given 06/16/23 1911)  famotidine (PEPCID) IVPB 20 mg premix (20 mg Intravenous New Bag/Given 06/16/23 1911)  iohexol (OMNIPAQUE) 300 MG/ML solution 100 mL (100 mLs Intravenous Contrast Given 06/16/23 1927)  alum & mag hydroxide-simeth (MAALOX/MYLANTA) 200-200-20 MG/5ML suspension 30 mL (30 mLs Oral Given 06/16/23 2009)    ED Course/ Medical Decision Making/ A&P                             Medical Decision Making Amount and/or Complexity of Data Reviewed Labs: ordered. Radiology: ordered.  Risk OTC drugs. Prescription drug management.   This patient presents to the ED for concern of abd pain, this involves an extensive number of treatment options, and is a complaint that carries with it a high risk of complications and morbidity.  The differential diagnosis includes  diverticulitis, appendicitis, kidney stone, uti   Co morbidities that complicate the patient evaluation  schizophrenia, hld, obesity, dm, depression, copd with prn oxygen at night, htn, hld, ischemic colitis, and diverticulitis   Additional history obtained:  Additional history obtained from epic chart review External records from outside source obtained and reviewed including EMS report   Lab Tests:  I Ordered, and personally interpreted labs.  The pertinent results include:  cbc nl, mg nl, cmp with k sl low at 3.2, lip elevated at 103 which looks chronic   Imaging Studies ordered:  I ordered imaging studies including ct abd/pelvis  I independently visualized and interpreted imaging which showed  Persistent thickening of the distal esophageal wall likely related  to reflux. Tiny hiatal hernia is again seen.    Persistent wall thickening and mottled appearance of the second and  third portions of the duodenum. The overall appearance is similar to  that seen on the prior exam. Interval endoscopy showed no focal  abnormality in this region in these changes are likely related  degree of groove pancreatitis given the elevated lipase on today's  exam as well as at the time of the previous study.    Diverticular change without diverticulitis.   I agree with the radiologist interpretation   Cardiac Monitoring:  The patient was maintained on a cardiac monitor.  I personally viewed and interpreted the cardiac monitored which showed an underlying rhythm of: nsr   Medicines ordered and prescription drug management:  I ordered medication including kcl  for hypok, pepcid and gi cocktail for sx  Reevaluation of the patient after these medicines showed that the patient improved I have reviewed the patients home medicines and have made adjustments as needed   Test Considered:  ct   Critical Interventions:  Pain control/ct  Problem List / ED Course:  Abd pain:  likely from  GERD.  I am going to add carafate.  Pt also asked for some zofran for n/v.  He is stable for d/c.  Return if worse.  F/u with GI/PCP.   Reevaluation:  After the interventions noted above, I reevaluated the patient and found that they have :improved   Social Determinants of Health:  Lives at home   Dispostion:  After consideration of the diagnostic results and the patients response to treatment, I feel that the patent would benefit from discharge with outpatient f/u.          Final Clinical Impression(s) / ED Diagnoses Final diagnoses:  Epigastric pain  Gastroesophageal reflux disease with esophagitis without hemorrhage  Hypokalemia    Rx / DC Orders ED Discharge Orders          Ordered    ondansetron (ZOFRAN-ODT) 4 MG disintegrating tablet  Every 8 hours PRN        06/16/23 2041    sucralfate (CARAFATE) 1 GM/10ML suspension  3 times daily with meals & bedtime        06/16/23 2041              Jacalyn Lefevre, MD 06/16/23 2045

## 2023-06-16 NOTE — ED Notes (Signed)
EDP aware IV K not complete as patient accidentally removed his IV. Given PO as substitute, see MAR.  Patient verbalizes understanding of discharge instructions. Opportunity for questioning and answers were provided. Armband removed by staff, pt discharged from ED to home with caregiver.

## 2023-06-16 NOTE — ED Notes (Signed)
Pt transported to CT ?

## 2023-06-18 NOTE — Progress Notes (Unsigned)
Cardiology Office Note   Date:  06/19/2023   ID:  Matthew Molina, DOB [birth year], MRN [id number]  PCP:  Matthew Perches, MD  Cardiologist:   Matthew Pates, MD   F/U of CAD     History of Present Illness: Matthew Molina is a 72 y.o. male with a history of HTN, DM, schizophrenia, tobacco use and CAD as noted by CT scan   I saw the pt in 2015 prior to CT   Had neg myoview.    He has been seen by Drs Eden Emms, Liz Malady    Last was in 2021   Myoview in 2019 showed no signficant ischemia     I saw the pt in Feb 2023 The pt was in the ER 6/29 with abdominal pan   CT was done   He was sent home     Since then he has continued to have abdominal pain   His wife says he has eaten minimally   Denies signficant CP    No dizziness but feels week  Current Meds  Medication Sig   ACCU-CHEK GUIDE test strip USE AS DIRECTED TO CHECK BLOOD SUGAR ONCE DAILY.   ACCU-CHEK GUIDE test strip USE TO CHECK BLOOD SUGAR ONCE DAILY   Accu-Chek Softclix Lancets lancets TEST BLOOD SUGAR ONCE DAILY AS DIRECTED.   acetaminophen (TYLENOL) 500 MG tablet Take 500-1,000 mg by mouth every 6 (six) hours as needed for headache.   albuterol (VENTOLIN HFA) 108 (90 Base) MCG/ACT inhaler INHALE 2 PUFFS INTO THE LUNGS EVERY 6 HOURS AS NEEDED FOR WHEEZING OR SHORTNESS OF BREATH.   aspirin 81 MG EC tablet Take 81 mg by mouth daily. Swallow whole.   busPIRone (BUSPAR) 7.5 MG tablet TAKE (1) TABLET BY MOUTH THREE TIMES A DAY.   calcium carbonate (TUMS - DOSED IN MG ELEMENTAL CALCIUM) 500 MG chewable tablet Chew 1 tablet by mouth daily as needed for indigestion or heartburn.   carvedilol (COREG) 6.25 MG tablet Take 6.25 mg by mouth 2 (two) times daily with a meal.   docusate sodium (COLACE) 100 MG capsule Take 100 mg by mouth daily.   FARXIGA 5 MG TABS tablet Take 5 mg by mouth every morning.   fenofibrate (TRICOR) 145 MG tablet TAKE 1 TABLET BY MOUTH DAILY. STOP TAKING EZETIMIBE.   glipiZIDE (GLUCOTROL XL) 10 MG 24 hr  tablet TAKE (2) TABLETS BY MOUTH DAILY EVERY MORNING AT BREAKFAST. (Patient taking differently: Take 10 mg by mouth daily with breakfast. TAKE (2) TABLETS BY MOUTH DAILY EVERY MORNING AT BREAKFAST.)   ipratropium-albuterol (DUONEB) 0.5-2.5 (3) MG/3ML SOLN INHALE 1 VIAL VIA NEBULIZER EVERY 4 TO 6 HOURS. (Patient taking differently: Inhale 3 mLs into the lungs every 6 (six) hours as needed (shortness of breath or wheezing).)   Lancets 30G MISC Once daily testing dx e11.9   linaclotide (LINZESS) 72 MCG capsule Take 1 capsule (72 mcg total) by mouth daily before breakfast. (Patient taking differently: Take 72 mcg by mouth daily as needed (constipation).)   meloxicam (MOBIC) 7.5 MG tablet TAKE 1 TABLET BY MOUTH ONCE DAILY.   montelukast (SINGULAIR) 10 MG tablet Take 1 tablet (10 mg total) by mouth at bedtime.   Multiple Vitamins-Minerals (MULTIVITAMIN WITH MINERALS) tablet Take 1 tablet by mouth daily.   olmesartan (BENICAR) 40 MG tablet TAKE ONE TABLET BY MOUTH DAILY   ondansetron (ZOFRAN-ODT) 4 MG disintegrating tablet Take 1 tablet (4 mg total) by mouth every 8 (eight) hours as needed.  pantoprazole (PROTONIX) 40 MG tablet TAKE ONE TABLET BY MOUTH ONCE DAILY.   Polyethyl Glycol-Propyl Glycol 0.4-0.3 % SOLN Place 1 Application into both eyes as needed (dry eyes).   risperiDONE (RISPERDAL) 1 MG tablet Take 1 mg by mouth at bedtime.   risperidone (RISPERDAL) 4 MG tablet TAKE ONE TABLET BY MOUTH IN THE EVENING. TAKE WITH 1MG  TABLET.   rosuvastatin (CRESTOR) 10 MG tablet TAKE ONE TABLET BY MOUTH ONCE DAILY.   sucralfate (CARAFATE) 1 GM/10ML suspension Take 10 mLs (1 g total) by mouth 4 (four) times daily -  with meals and at bedtime.   SYMBICORT 160-4.5 MCG/ACT inhaler INHALE 2 PUFFS INTO THE LUNGS ONCE IN THE MORNING AND AT BEDTIME.   tamsulosin (FLOMAX) 0.4 MG CAPS capsule Take 1 capsule (0.4 mg total) by mouth daily after supper.   traZODone (DESYREL) 50 MG tablet TAKE ONE TABLET BY MOUTH AT BEDTIME AS  NEEDED FOR SLEEP.   triamterene-hydrochlorothiazide (MAXZIDE-25) 37.5-25 MG tablet TAKE (1/2) TABLET BY MOUTH ONCE DAILY.   [DISCONTINUED] ondansetron (ZOFRAN-ODT) 4 MG disintegrating tablet Take 1 tablet (4 mg total) by mouth every 8 (eight) hours as needed for nausea or vomiting.     Allergies:   Sertraline hcl and Wellbutrin [bupropion]   Past Medical History:  Diagnosis Date   Acute blood loss anemia 01/05/2020   Acute respiratory failure with hypoxia (HCC)    Arthritis    COPD (chronic obstructive pulmonary disease) (HCC)    Depression    Diabetes mellitus    Diabetes mellitus without complication (HCC)    Diverticulitis    Elevated PSA 10/01/2016   Encounter for support and coordination of transition of care 01/14/2020   Hypercholesterolemia    Hyperlipidemia    Hypertension    Insomnia 01/05/2016   Ischemic colitis (HCC) 01/05/2020   Multiple lung nodules on CT 04/02/2015   Nicotine addiction    Obesity    Oxygen deficiency    qhs   Schizophrenia [facility])     Past Surgical History:  Procedure Laterality Date   BIOPSY  05/28/2023   Procedure: BIOPSY;  Surgeon: Lanelle Bal, DO;  [facility];  Service: Endoscopy;;   CATARACT EXTRACTION W/PHACO Left 11/20/2013   Procedure: CATARACT EXTRACTION PHACO AND INTRAOCULAR LENS PLACEMENT (IOC);  Surgeon: Gemma Payor, MD;  Location: [facility] OR[facility];  Service: Ophthalmology;  Laterality: Left;  CDE:10.26   CATARACT EXTRACTION W/PHACO Right 12/08/2013   Procedure: RIGHT EYE CATARACT EXTRACTION PHACO AND INTRAOCULAR LENS PLACEMENT ;  Surgeon: Gemma Payor, MD;  Location: [facility];  Service: Ophthalmology;  Laterality: Right;  CDE 12.38   COLONOSCOPY N/A 04/01/2013   pancolonic diverticulosis, redundant colon, large internal hemorrhoids.    COLONOSCOPY  2014   INCOMPLETE PREP IN R COLON   COLONOSCOPY N/A 04/01/2020   Procedure: COLONOSCOPY;  Surgeon: West Bali, MD;  Location: [facility];  Service: Endoscopy;  Laterality:  N/A;  8:45am   ESOPHAGOGASTRODUODENOSCOPY N/A 02/18/2016   stricture at GE junction, moderate erosive gastritis and mild non-erosive duodenitis. +H.pylori. Treated initially with Prevpac. Breath test to check for eradication was positive, and he was prescribed Pylera.    ESOPHAGOGASTRODUODENOSCOPY (EGD) WITH PROPOFOL N/A 05/28/2023   Procedure: ESOPHAGOGASTRODUODENOSCOPY (EGD) WITH PROPOFOL;  Surgeon: Lanelle Bal, DO;  [facility];  Service: Endoscopy;  Laterality: N/A;  11:15am, asa 3   EYE SURGERY Left 11/2013   cataract extraction   POLYPECTOMY  04/01/2020   Procedure: POLYPECTOMY;  Surgeon: West Bali, MD;  [facility];  Service: Endoscopy;;   SAVORY DILATION N/A 02/18/2016   Procedure: SAVORY DILATION;  Surgeon: West Bali, MD;  [facility];  Service: Endoscopy;  Laterality: N/A;   SHOULDER SURGERY Right      Social History:  The patient  reports that he has been smoking cigarettes. He has a 48.00 pack-year smoking history. He has never used smokeless tobacco. He reports current alcohol use. He reports that he does not use drugs.   Family History:  The patient's family history includes Diabetes in his mother and sister; Drug abuse in his brother; Heart disease in his sister; Hypertension in his mother; Kidney disease in his father; Stroke in his mother.    ROS:  Please see the history of present illness. All other systems are reviewed and  Negative to the above problem except as noted.    PHYSICAL EXAM: VS:  BP (!) 86/56   Pulse 86   Ht 6\' 1"  (1.854 m)   Wt 218 lb (98.9 kg)   SpO2 97%   BMI 28.76 kg/m   GEN: Obese 72 yo in no acute distress  HEENT: normal  Neck: no JVD, Cardiac: RRR;   No LE  edema  Respiratory:  clear to auscultation bilaterally GI: soft,  Mild diffuse tenderness  NO  hepatomegaly    EKG:  EKG is not ordered today.     Cardiac Studies  Myoview 02/11/18 There was no ST segment deviation noted during  stress. Defect 1: There is a small defect of mild severity present in the mid inferior and mid inferolateral location. This may represent a small ischemic territory vs variable soft tissue attenuation. Overall, this is a low risk study. Nuclear stress EF: 65%.   Lipid Panel    Component Value Date/Time   CHOL 133 04/27/2023 0921   TRIG 165 (H) 04/27/2023 0921   HDL 33 (L) 04/27/2023 0921   CHOLHDL 4.0 04/27/2023 0921   CHOLHDL 5.1 01/05/2022 1200   VLDL 72 (H) 01/05/2022 1200   LDLCALC 72 04/27/2023 0921   LDLCALC 104 (H) 05/26/2020 1149      Wt Readings from Last 3 Encounters:  06/19/23 218 lb (98.9 kg)  06/16/23 223 lb (101.2 kg)  05/25/23 222 lb (100.7 kg)      ASSESSMENT AND PLAN:  1  Abdominal discomfort  Diffuse   No fevers  PO intake poor Will get labs   (CBC, CMET).     Hold meds for now since BP is low  I have asked the pt to go to his GI MD to be seen  2  HTN  BP is low  ON my check 86/55   HOld Maxzide  Hold Olmesartan   Will need to to follow at home   Add back as BP improves  Get labs today   3    CAD CAD picked up on CT scans   No symptoms of CP     4  DM   A1C 7.1 in May 2024  5  HL  Continue Crestor and Tricor  LDL 72  HDL 33  Trig 165  Continue   6 COPD  Uses nebulizer     7  Tob use   Counselled on quitting   Says he is smoking 4 cigs per day  Follow up in Aug for BP check     Current medicines are reviewed at length with the patient today.  The patient does not have concerns  regarding medicines.  Signed, Matthew Pates, MD  06/19/2023 10:57 AM    [facility] [address] [place], [place], [place]  [address] Phone: [phone]; Fax: [phone]

## 2023-06-19 ENCOUNTER — Other Ambulatory Visit: Payer: Self-pay | Admitting: Family Medicine

## 2023-06-19 ENCOUNTER — Ambulatory Visit: Payer: 59 | Attending: Internal Medicine | Admitting: Internal Medicine

## 2023-06-19 ENCOUNTER — Other Ambulatory Visit (HOSPITAL_COMMUNITY)
Admission: RE | Admit: 2023-06-19 | Discharge: 2023-06-19 | Disposition: A | Discharge status: Home / Self Care | Payer: 59 | Source: Ambulatory Visit | Attending: Internal Medicine | Admitting: Internal Medicine

## 2023-06-19 VITALS — BP 86/56 | HR 86 | Ht 73.0 in | Wt 218.0 lb

## 2023-06-19 DIAGNOSIS — I959 Hypotension, unspecified: Secondary | ICD-10-CM

## 2023-06-19 DIAGNOSIS — M25551 Pain in right hip: Secondary | ICD-10-CM

## 2023-06-19 DIAGNOSIS — J449 Chronic obstructive pulmonary disease, unspecified: Secondary | ICD-10-CM

## 2023-06-19 LAB — COMPREHENSIVE METABOLIC PANEL
ALT: 20 U/L (ref 0–44)
AST: 23 U/L (ref 15–41)
Albumin: 3.9 g/dL (ref 3.5–5.0)
Alkaline Phosphatase: 25 U/L — ABNORMAL LOW (ref 38–126)
Anion gap: 10 (ref 5–15)
BUN: 21 mg/dL (ref 8–23)
CO2: 23 mmol/L (ref 22–32)
Calcium: 10.3 mg/dL (ref 8.9–10.3)
Chloride: 97 mmol/L — ABNORMAL LOW (ref 98–111)
Creatinine, Ser: 1.82 mg/dL — ABNORMAL HIGH (ref 0.61–1.24)
GFR, Estimated: 39 mL/min — ABNORMAL LOW (ref >60)
Glucose, Bld: 171 mg/dL — ABNORMAL HIGH (ref 70–99)
Potassium: 3.7 mmol/L (ref 3.5–5.1)
Sodium: 130 mmol/L — ABNORMAL LOW (ref 135–145)
Total Bilirubin: 0.8 mg/dL (ref 0.3–1.2)
Total Protein: 7.5 g/dL (ref 6.5–8.1)

## 2023-06-19 LAB — CBC
HCT: 41 % (ref 39.0–52.0)
Hemoglobin: 13 g/dL (ref 13.0–17.0)
MCH: 28.4 pg (ref 26.0–34.0)
MCHC: 31.7 g/dL (ref 30.0–36.0)
MCV: 89.7 fL (ref 80.0–100.0)
Platelets: 222 10*3/uL (ref 150–400)
RBC: 4.57 MIL/uL (ref 4.22–5.81)
RDW: 14.6 % (ref 11.5–15.5)
WBC: 7.4 10*3/uL (ref 4.0–10.5)
nRBC: 0 % (ref 0.0–0.2)

## 2023-06-19 NOTE — Patient Instructions (Addendum)
Medication Instructions:  HOLD Maxzide  HOLD Olmesartan  Labwork: CBC,CMET Now  Testing/Procedures: None today  Follow-Up: August  Any Other Special Instructions Will Be Listed Below (If Applicable).  If you need a refill on your cardiac medications before your next appointment, please call your pharmacy.

## 2023-06-20 DIAGNOSIS — D631 Anemia in chronic kidney disease: Secondary | ICD-10-CM | POA: Diagnosis not present

## 2023-06-20 DIAGNOSIS — I129 Hypertensive chronic kidney disease with stage 1 through stage 4 chronic kidney disease, or unspecified chronic kidney disease: Secondary | ICD-10-CM | POA: Diagnosis not present

## 2023-06-20 DIAGNOSIS — R809 Proteinuria, unspecified: Secondary | ICD-10-CM | POA: Diagnosis not present

## 2023-06-20 DIAGNOSIS — N1832 Chronic kidney disease, stage 3b: Secondary | ICD-10-CM | POA: Diagnosis not present

## 2023-06-22 ENCOUNTER — Other Ambulatory Visit: Payer: Self-pay | Admitting: Gastroenterology

## 2023-06-22 DIAGNOSIS — K219 Gastro-esophageal reflux disease without esophagitis: Secondary | ICD-10-CM

## 2023-06-22 DIAGNOSIS — K922 Gastrointestinal hemorrhage, unspecified: Secondary | ICD-10-CM

## 2023-06-22 DIAGNOSIS — K649 Unspecified hemorrhoids: Secondary | ICD-10-CM

## 2023-06-22 DIAGNOSIS — R1084 Generalized abdominal pain: Secondary | ICD-10-CM

## 2023-06-22 MED ORDER — PANTOPRAZOLE SODIUM 40 MG PO TBEC: 40.0000 mg | DELAYED_RELEASE_TABLET | Freq: Every day | ORAL | 3 refills | Status: DC

## 2023-06-25 ENCOUNTER — Other Ambulatory Visit: Payer: Self-pay | Admitting: Family Medicine

## 2023-06-26 DIAGNOSIS — E1122 Type 2 diabetes mellitus with diabetic chronic kidney disease: Secondary | ICD-10-CM | POA: Diagnosis not present

## 2023-06-26 DIAGNOSIS — D472 Monoclonal gammopathy: Secondary | ICD-10-CM | POA: Diagnosis not present

## 2023-06-26 DIAGNOSIS — F17290 Nicotine dependence, other tobacco product, uncomplicated: Secondary | ICD-10-CM | POA: Diagnosis not present

## 2023-06-26 DIAGNOSIS — N1832 Chronic kidney disease, stage 3b: Secondary | ICD-10-CM | POA: Diagnosis not present

## 2023-06-27 ENCOUNTER — Other Ambulatory Visit: Payer: Self-pay | Admitting: Family Medicine

## 2023-06-27 ENCOUNTER — Other Ambulatory Visit: Payer: 59

## 2023-06-27 DIAGNOSIS — J449 Chronic obstructive pulmonary disease, unspecified: Secondary | ICD-10-CM

## 2023-06-29 ENCOUNTER — Encounter: Payer: Self-pay | Admitting: Family Medicine

## 2023-06-29 ENCOUNTER — Ambulatory Visit (INDEPENDENT_AMBULATORY_CARE_PROVIDER_SITE_OTHER): Payer: 59 | Admitting: Family Medicine

## 2023-06-29 VITALS — BP 136/88 | HR 73 | Ht 73.0 in | Wt 218.1 lb

## 2023-06-29 DIAGNOSIS — R634 Abnormal weight loss: Secondary | ICD-10-CM | POA: Diagnosis not present

## 2023-06-29 DIAGNOSIS — K859 Acute pancreatitis without necrosis or infection, unspecified: Secondary | ICD-10-CM

## 2023-06-29 DIAGNOSIS — K861 Other chronic pancreatitis: Secondary | ICD-10-CM | POA: Diagnosis not present

## 2023-06-29 DIAGNOSIS — E669 Obesity, unspecified: Secondary | ICD-10-CM

## 2023-06-29 DIAGNOSIS — F411 Generalized anxiety disorder: Secondary | ICD-10-CM

## 2023-06-29 DIAGNOSIS — R109 Unspecified abdominal pain: Secondary | ICD-10-CM | POA: Diagnosis not present

## 2023-06-29 DIAGNOSIS — Z09 Encounter for follow-up examination after completed treatment for conditions other than malignant neoplasm: Secondary | ICD-10-CM

## 2023-06-29 DIAGNOSIS — E1169 Type 2 diabetes mellitus with other specified complication: Secondary | ICD-10-CM

## 2023-06-29 MED ORDER — SUCRALFATE 1 GM/10ML PO SUSP: 1.0000 g | Freq: Three times a day (TID) | ORAL | 0 refills | Status: DC

## 2023-06-29 NOTE — Patient Instructions (Addendum)
Nurse B P check next week Wednesday, please notify MD if blood pressure uncontrolled at time of visit  F/U with MD, 8/12 or after  HBA1C and chem 7 non fasting 8/10 or after but at least 2 days before f/u appt  Chem 7 and eGFr , amylase and lipase today, dx abdominal pain and h/o pancreatitis  Nurse pls give pt two tylenol tabs  in office for headache  Pls DO hold the olmesartan and triamterene per cardiology, you report taking them today only  You are being referred to GI  Nurse pls verify that Risperdal  1 mg tablet is ready to be dispensed, he is also on the 4 mg dose , pt feels agitated also his duoneb, bnoth sent on 7/10 pt states he does not have them   Thanks for choosing [facility], we consider it a privelige to serve you.

## 2023-06-30 DIAGNOSIS — R634 Abnormal weight loss: Secondary | ICD-10-CM | POA: Insufficient documentation

## 2023-06-30 DIAGNOSIS — K861 Other chronic pancreatitis: Secondary | ICD-10-CM | POA: Insufficient documentation

## 2023-06-30 LAB — BMP8+EGFR
BUN/Creatinine Ratio: 11 (ref 10–24)
BUN: 17 mg/dL (ref 8–27)
CO2: 27 mmol/L (ref 20–29)
Calcium: 10.8 mg/dL — ABNORMAL HIGH (ref 8.6–10.2)
Chloride: 99 mmol/L (ref 96–106)
Creatinine, Ser: 1.6 mg/dL — ABNORMAL HIGH (ref 0.76–1.27)
Glucose: 59 mg/dL — ABNORMAL LOW (ref 70–99)
Potassium: 4 mmol/L (ref 3.5–5.2)
Sodium: 138 mmol/L (ref 134–144)
eGFR: 45 mL/min/{1.73_m2} — ABNORMAL LOW (ref >59)

## 2023-06-30 LAB — LIPASE: Lipase: 70 U/L (ref 13–78)

## 2023-06-30 LAB — AMYLASE: Amylase: 149 U/L — ABNORMAL HIGH (ref 31–110)

## 2023-06-30 NOTE — Progress Notes (Signed)
Matthew Molina     MRN: [id number]      DOB: 04-21-1951  Chief Complaint  Patient presents with   Follow-up    Follow up still having acid reflux patient states heart doctor took him off of 2 blood pressure medications wants to be sure that is ok     HPI Matthew Molina is here for follow up and re-evaluation of chronic medical conditions, medication management and review of any available recent lab and radiology data.  Preventive health is updated, specifically  Cancer screening and Immunization.   Was evaluated on 6/29 in the eD with abdoinal pain and pancreatitis, at that time he was also very hypotensive Has seen cardiology since then, was told to stay off of 2 BP meds, reports that he decided to take both today as he had a headache , rated at a 3 to 4 when asked and abdominal pain, and felt his BP was the issue C/o uncontrolled reflux , has been srtated on carafate from ed visit, and feels as though protonix is not working ,needs GI folow up on recent pancreatitis as well as excessive weighht loss. C/o anxiety and agitation , needs risprdal 1 mg tablet Requests  duoneb solution , was sent in earlier this week wiil f/u  ROS Denies recent fever or chills. Denies sinus pressure, nasal congestion, ear pain or sore throat. Denies chest congestion, productive cough or wheezing. Denies chest pains, palpitations and leg swelling .   Denies dysuria, frequency, hesitancy or incontinence. Denies joint pain, swelling and limitation in mobility. Denies headaches, seizures, numbness, or tingling.  Denies skin break down or rash.   PE  BP 136/88   Pulse 73   Ht 6\' 1"  (1.854 m)   Wt 218 lb 1.9 oz (98.9 kg)   SpO2 94%   BMI 28.78 kg/m   Patient alert and oriented and in no cardiopulmonary distress.Anxious, agitated  HEENT: No facial asymmetry, EOMI,     Neck supple .  Chest: Clear to auscultation bilaterally.decreased air entry  CVS: S1, S2 no murmurs, no S3.Regular rate.  ABD: Soft  non tender.   Ext: No edema  MS: decreased  ROM spine, shoulders, hips and knees.  Skin: Intact, no ulcerations or rash noted.  Psych: Good eye contact,  anxious  CNS: CN 2-12 intact, power,  normal throughout.no focal deficits noted.   Assessment & Plan  Encounter for examination following treatment at hospital Patient in for follow up of recent ED viisit. Discharge summary, and laboratory and radiology data are reviewed, and any questions or concerns  are discussed. Specific issues requiring follow up are specifically addressed.   Acute pancreatitis Symptoms have improved, but still has poor appetite and abdominal discomfort , rept labs , encouraged frequent eating of small meals he is able to tolerte, refer for GI follow up  GAD (generalized anxiety disorder) Currently not well controlled, ensure medications are at pharmacy  Type 2 diabetes mellitus with obesity [facility]) Matthew Molina is reminded of the importance of commitment to daily physical activity for 30 minutes or more, as able and the need to limit carbohydrate intake to 30 to 60 grams per meal to help with blood sugar control.   The need to take medication as prescribed, test blood sugar as directed, and to call between visits if there is a concern that blood sugar is uncontrolled is also discussed.   Matthew Molina is reminded of the importance of daily foot exam, annual eye examination, and good  blood sugar, blood pressure and cholesterol control.     Latest Ref Rng & Units 06/29/2023   11:04 AM 06/19/2023   11:42 AM 06/16/2023    6:44 PM 04/27/2023    9:21 AM 04/22/2023    4:04 PM  Diabetic Labs  HbA1c 4.8 - 5.6 %    7.1    Chol 100 - 199 mg/dL    161    HDL >09 mg/dL    33    Calc LDL 0 - 99 mg/dL    72    Triglycerides 0 - 149 mg/dL    604    Creatinine 5.40 - 1.27 mg/dL 9.81  1.91  4.78  2.95  1.53       06/29/2023   10:44 AM 06/29/2023   10:00 AM 06/19/2023   10:51 AM 06/16/2023    8:45 PM 06/16/2023    7:15 PM  06/16/2023    6:36 PM 06/16/2023    6:34 PM  BP/Weight  Systolic BP 136 127 86 142 136  119  Diastolic BP 88 82 56 80 88  89  Wt. (Lbs)  218.12 218   223   BMI  28.78 kg/m2 28.76 kg/m2   29.42 kg/m2       Latest Ref Rng & Units 02/20/2023   12:00 AM 09/19/2022    9:00 AM  Foot/eye exam completion dates  Eye Exam No Retinopathy No Retinopathy       Foot Form Completion   Done     This result is from an external source.      Controlled, no change in medication   Chronic pancreatitis (HCC) Abdominal pain  with elevated amylase and lipase for 2 months , from 05/-04 to current , and weight los, refer t GI for follow up  Weight loss, non-intentional GI to follow and eval

## 2023-06-30 NOTE — Assessment & Plan Note (Signed)
Matthew Molina is reminded of the importance of commitment to daily physical activity for 30 minutes or more, as able and the need to limit carbohydrate intake to 30 to 60 grams per meal to help with blood sugar control.   The need to take medication as prescribed, test blood sugar as directed, and to call between visits if there is a concern that blood sugar is uncontrolled is also discussed.   Matthew Molina is reminded of the importance of daily foot exam, annual eye examination, and good blood sugar, blood pressure and cholesterol control.     Latest Ref Rng & Units 06/29/2023   11:04 AM 06/19/2023   11:42 AM 06/16/2023    6:44 PM 04/27/2023    9:21 AM 04/22/2023    4:04 PM  Diabetic Labs  HbA1c 4.8 - 5.6 %    7.1    Chol 100 - 199 mg/dL    161    HDL >09 mg/dL    33    Calc LDL 0 - 99 mg/dL    72    Triglycerides 0 - 149 mg/dL    604    Creatinine 5.40 - 1.27 mg/dL 9.81  1.91  4.78  2.95  1.53       06/29/2023   10:44 AM 06/29/2023   10:00 AM 06/19/2023   10:51 AM 06/16/2023    8:45 PM 06/16/2023    7:15 PM 06/16/2023    6:36 PM 06/16/2023    6:34 PM  BP/Weight  Systolic BP 136 127 86 142 136  119  Diastolic BP 88 82 56 80 88  89  Wt. (Lbs)  218.12 218   223   BMI  28.78 kg/m2 28.76 kg/m2   29.42 kg/m2       Latest Ref Rng & Units 02/20/2023   12:00 AM 09/19/2022    9:00 AM  Foot/eye exam completion dates  Eye Exam No Retinopathy No Retinopathy       Foot Form Completion   Done     This result is from an external source.      Controlled, no change in medication

## 2023-06-30 NOTE — Assessment & Plan Note (Signed)
Patient in for follow up of recent ED viisit. Discharge summary, and laboratory and radiology data are reviewed, and any questions or concerns  are discussed. Specific issues requiring follow up are specifically addressed.  

## 2023-06-30 NOTE — Assessment & Plan Note (Signed)
Symptoms have improved, but still has poor appetite and abdominal discomfort , rept labs , encouraged frequent eating of small meals he is able to tolerte, refer for GI follow up

## 2023-06-30 NOTE — Assessment & Plan Note (Signed)
Currently not well controlled, ensure medications are at pharmacy

## 2023-06-30 NOTE — Assessment & Plan Note (Signed)
GI to follow and eval

## 2023-06-30 NOTE — Assessment & Plan Note (Signed)
Abdominal pain  with elevated amylase and lipase for 2 months , from 05/-04 to current , and weight los, refer t GI for follow up

## 2023-07-02 DIAGNOSIS — J449 Chronic obstructive pulmonary disease, unspecified: Secondary | ICD-10-CM | POA: Diagnosis not present

## 2023-07-04 ENCOUNTER — Ambulatory Visit: Payer: 59 | Admitting: Urology

## 2023-07-05 ENCOUNTER — Ambulatory Visit: Payer: 59

## 2023-07-09 ENCOUNTER — Other Ambulatory Visit: Payer: Self-pay | Admitting: Family Medicine

## 2023-07-10 ENCOUNTER — Other Ambulatory Visit: Payer: Self-pay | Admitting: Family Medicine

## 2023-07-12 ENCOUNTER — Encounter: Payer: Self-pay | Admitting: Gastroenterology

## 2023-07-12 ENCOUNTER — Other Ambulatory Visit: Payer: Self-pay | Admitting: Family Medicine

## 2023-07-12 ENCOUNTER — Other Ambulatory Visit: Payer: Self-pay | Admitting: Internal Medicine

## 2023-07-12 ENCOUNTER — Other Ambulatory Visit: Payer: Self-pay | Admitting: Gastroenterology

## 2023-07-14 ENCOUNTER — Other Ambulatory Visit: Payer: Self-pay | Admitting: Family Medicine

## 2023-07-16 ENCOUNTER — Other Ambulatory Visit: Payer: Self-pay

## 2023-07-16 MED ORDER — FENOFIBRATE 145 MG PO TABS: 145.0000 mg | ORAL_TABLET | Freq: Every day | ORAL | 0 refills | Status: DC

## 2023-07-17 ENCOUNTER — Other Ambulatory Visit: Payer: Self-pay

## 2023-07-17 ENCOUNTER — Telehealth: Payer: Self-pay | Admitting: Family Medicine

## 2023-07-17 MED ORDER — ONDANSETRON 4 MG PO TBDP: 4.0000 mg | ORAL_TABLET | Freq: Three times a day (TID) | ORAL | 0 refills | Status: DC | PRN

## 2023-07-17 NOTE — Telephone Encounter (Signed)
Prescription Request  07/17/2023  LOV: 06/29/2023  What is the name of the medication or equipment? ondansetron (ZOFRAN-ODT) 4 MG disintegrating tablet   Have you contacted your pharmacy to request a refill? No   Which pharmacy would you like this sent to?  [facility] - [place],  - [address] [place] [address] Phone: [phone] Fax: [phone]    Patient notified that their request is being sent to the clinical staff for review and that they should receive a response within 2 business days.   Please advise at [facility] [phone]

## 2023-07-17 NOTE — Telephone Encounter (Signed)
Refill sent.

## 2023-07-19 ENCOUNTER — Ambulatory Visit: Payer: 59 | Attending: Internal Medicine | Admitting: Internal Medicine

## 2023-07-19 ENCOUNTER — Encounter: Payer: Self-pay | Admitting: Internal Medicine

## 2023-07-20 ENCOUNTER — Other Ambulatory Visit: Payer: Self-pay | Admitting: Family Medicine

## 2023-07-20 DIAGNOSIS — M25551 Pain in right hip: Secondary | ICD-10-CM

## 2023-07-20 DIAGNOSIS — F209 Schizophrenia, unspecified: Secondary | ICD-10-CM

## 2023-07-30 ENCOUNTER — Other Ambulatory Visit: Payer: Self-pay | Admitting: Nurse Practitioner

## 2023-07-31 ENCOUNTER — Telehealth: Payer: Self-pay | Admitting: Family Medicine

## 2023-07-31 ENCOUNTER — Other Ambulatory Visit: Payer: Self-pay | Admitting: Family Medicine

## 2023-07-31 ENCOUNTER — Other Ambulatory Visit: Payer: Self-pay

## 2023-07-31 MED ORDER — ACCU-CHEK AVIVA PLUS W/DEVICE KIT: PACK | 0 refills | Status: DC

## 2023-07-31 NOTE — Telephone Encounter (Signed)
Patient called left voicemail he needs a new blood sugar meter, his has stop working.  Pharmacy  [facility] - [place], [place] - [address], [place] [address] Phone: [phone]  Fax: [phone]

## 2023-07-31 NOTE — Telephone Encounter (Signed)
Meter sent to [facility] per patient request

## 2023-08-02 ENCOUNTER — Ambulatory Visit (INDEPENDENT_AMBULATORY_CARE_PROVIDER_SITE_OTHER): Payer: 59 | Admitting: Family Medicine

## 2023-08-02 ENCOUNTER — Encounter: Payer: Self-pay | Admitting: Family Medicine

## 2023-08-02 ENCOUNTER — Ambulatory Visit: Payer: 59 | Admitting: Gastroenterology

## 2023-08-02 VITALS — BP 128/79 | HR 81 | Ht 73.0 in | Wt 215.0 lb

## 2023-08-02 DIAGNOSIS — K859 Acute pancreatitis without necrosis or infection, unspecified: Secondary | ICD-10-CM

## 2023-08-02 DIAGNOSIS — E1159 Type 2 diabetes mellitus with other circulatory complications: Secondary | ICD-10-CM

## 2023-08-02 DIAGNOSIS — I10 Essential (primary) hypertension: Secondary | ICD-10-CM

## 2023-08-02 DIAGNOSIS — F411 Generalized anxiety disorder: Secondary | ICD-10-CM | POA: Diagnosis not present

## 2023-08-02 DIAGNOSIS — M25551 Pain in right hip: Secondary | ICD-10-CM | POA: Diagnosis not present

## 2023-08-02 DIAGNOSIS — E785 Hyperlipidemia, unspecified: Secondary | ICD-10-CM | POA: Diagnosis not present

## 2023-08-02 DIAGNOSIS — Z720 Tobacco use: Secondary | ICD-10-CM | POA: Diagnosis not present

## 2023-08-02 DIAGNOSIS — J449 Chronic obstructive pulmonary disease, unspecified: Secondary | ICD-10-CM | POA: Diagnosis not present

## 2023-08-02 MED ORDER — FENOFIBRATE 145 MG PO TABS: 145.0000 mg | ORAL_TABLET | Freq: Every day | ORAL | 3 refills | Status: DC

## 2023-08-02 MED ORDER — BUSPIRONE HCL 10 MG PO TABS: 10.0000 mg | ORAL_TABLET | Freq: Three times a day (TID) | ORAL | 2 refills | Status: DC

## 2023-08-02 MED ORDER — METHYLPREDNISOLONE ACETATE 80 MG/ML IJ SUSP
40.0000 mg | Freq: Once | INTRAMUSCULAR | Status: AC
Start: 2023-08-02 — End: 2023-08-02
  Administered 2023-08-02: 40 mg via INTRAMUSCULAR

## 2023-08-02 NOTE — Patient Instructions (Addendum)
Annual exam October 7 or after, call if you need me sooner  Nurse please enter covid and TdAP  recently got at [facility]   Depo medrol 40 mg iM in office today for  hip pain  Work on cutting back smoking    Increase medication  dose of buspar for anxiety to 10 mg three times daily  Fasting lipid, cmp and eGFR and HBA1C 3 to 5 days before next appt

## 2023-08-08 ENCOUNTER — Encounter: Payer: Self-pay | Admitting: Gastroenterology

## 2023-08-08 ENCOUNTER — Other Ambulatory Visit: Payer: Self-pay

## 2023-08-08 ENCOUNTER — Ambulatory Visit (INDEPENDENT_AMBULATORY_CARE_PROVIDER_SITE_OTHER): Payer: 59 | Admitting: Gastroenterology

## 2023-08-08 VITALS — BP 122/78 | HR 75 | Temp 98.0°F | Ht 73.0 in | Wt 213.4 lb

## 2023-08-08 DIAGNOSIS — K859 Acute pancreatitis without necrosis or infection, unspecified: Secondary | ICD-10-CM | POA: Diagnosis not present

## 2023-08-08 DIAGNOSIS — R110 Nausea: Secondary | ICD-10-CM | POA: Diagnosis not present

## 2023-08-08 DIAGNOSIS — R933 Abnormal findings on diagnostic imaging of other parts of digestive tract: Secondary | ICD-10-CM | POA: Diagnosis not present

## 2023-08-08 DIAGNOSIS — D126 Benign neoplasm of colon, unspecified: Secondary | ICD-10-CM

## 2023-08-08 DIAGNOSIS — G8929 Other chronic pain: Secondary | ICD-10-CM | POA: Diagnosis not present

## 2023-08-08 DIAGNOSIS — K5900 Constipation, unspecified: Secondary | ICD-10-CM

## 2023-08-08 DIAGNOSIS — N401 Enlarged prostate with lower urinary tract symptoms: Secondary | ICD-10-CM

## 2023-08-08 DIAGNOSIS — K219 Gastro-esophageal reflux disease without esophagitis: Secondary | ICD-10-CM

## 2023-08-08 DIAGNOSIS — R1013 Epigastric pain: Secondary | ICD-10-CM | POA: Diagnosis not present

## 2023-08-08 MED ORDER — TAMSULOSIN HCL 0.4 MG PO CAPS: 0.4000 mg | ORAL_CAPSULE | Freq: Every day | ORAL | 0 refills | Status: DC

## 2023-08-08 MED ORDER — ESOMEPRAZOLE MAGNESIUM 40 MG PO CPDR: 40.0000 mg | DELAYED_RELEASE_CAPSULE | Freq: Every day | ORAL | 2 refills | Status: DC

## 2023-08-08 MED ORDER — LINACLOTIDE 145 MCG PO CAPS: 145.0000 ug | ORAL_CAPSULE | Freq: Every day | ORAL | 3 refills | Status: DC

## 2023-08-08 MED ORDER — ONDANSETRON 4 MG PO TBDP: 4.0000 mg | ORAL_TABLET | Freq: Every day | ORAL | 0 refills | Status: DC | PRN

## 2023-08-08 MED ORDER — SUCRALFATE 1 GM/10ML PO SUSP: 1.0000 g | Freq: Three times a day (TID) | ORAL | 0 refills | Status: DC | PRN

## 2023-08-08 NOTE — Progress Notes (Signed)
GI Office Note    Referring Provider: Kerri Perches, MD Primary Care Physician:  Matthew Perches, MD Primary Gastroenterologist: Matthew Molina. Matthew Lor, DO  Date:  08/08/2023  ID:  Matthew Molina, DOB November 13, 1951, MRN [id number]   Chief Complaint   Chief Complaint  Patient presents with   Abdominal Pain    Stomach upset. Feels like he is going to vomit all the time and some time it hurts. Gassy   History of Present Illness  Matthew Molina is a 72 y.o. male with a history of COPD, diabetes, ischemic colitis in 2021, HLD, HTN, depression, and chronic epigastric pain and constipation presenting today with complaint of ongoing upper abdominal pain and nausea.  EGD March 2017: -Esophageal stricture s/p dilation -Gastritis and duodenitis -Biopsies positive for H. Pylori   Initially treated for H. pylori with Prevpac and then subsequently Pylera.  Has had documented eradication.   Colonoscopy April 2021: -7 colon polyps -Pancolonic diverticulosis -External and internal hemorrhoids -Pathology revealed tubular adenomas -Recommend repeat colonoscopy in 3 years   CT chest 03/05/2023: -Moderate focal circumferential wall thickening of lower esophagus, recommended GI referral for endoscopic correlation to exclude esophageal malignancy -Lung RADS 2, benign appearance -Stable mildly dilated ascending thoracic aorta measuring 4.3 cm   Office visit 03/15/2023 by Dr. Marletta Molina. Noted to have found distal esophageal wall thickening on CT chest for lung cancer screening.  Patient denies any dysphagia but does report chronic reflux which is well-controlled on pantoprazole 40 mg once daily.  Denies any family history of gastrointestinal cancer.  Did report taking Mobic daily for joint pains. Advised continue PPI daily.  Colonoscopy offered but patient declined.  Advised to schedule for EGD to further evaluate abnormal esophagus on CT scan.  Advised fatty liver diet for abnormal LFTs.   ED visit  5/5 for abdominal pain.  Brought in by EMS for epigastric and suprapubic abdominal pain for 2-3 days with nausea and vomiting and decreased p.o. intake.  Reported similar symptoms once before that he stated was due to constipation.  Admitted to drinking at least 1 can of beer per day.  Abdominal exam without any peritoneal signs.  UA was negative for UTI, WBC 8.7, hemoglobin 13.7, creatinine 1.53, lipase 124.  CT with circumferential wall thickening and moderate enhancement of proximal duodenum with adjacent fat stranding with differentials including duodenitis or groove pancreatitis, circumferential wall thickening of distal esophagus which can be seen in the setting of esophagitis.  He was noted to have epigastric tenderness on exam.  He was recommended to follow a liquid diet and was given nausea and pain control to take at home and advised on small nonfatty meals to start in a few days.  He was discussed the importance of scheduling his upper endoscopy and patient reported he needed more time.  Advised to follow-up if symptoms worsened.  Last office visit 04/30/23.  Patient states when he eats his stomach bothers him he will sip on Sprite 0 and lay down and sometimes that helps.  Primarily is eating soft foods.  Trying to stay away from fried foods and attempts to drink a lot of liquids.  Having nausea but no vomiting.  Taking pantoprazole once daily.  Uses Zofran as needed.  Stopped taking his B12 and now only taking a multivitamin.  Patient reported he stopped drinking just prior to his ED visit in early May.  Taking Dulcolax over-the-counter to help with constipation.  Denied any straining.  Requested to trial Linzess.  Denied any dysphagia.  Given Linzess samples.  Advised alcohol cessation and continue PPI once daily.  EGD ordered.  Advised low-fat diet.  Patient declined colonoscopy.  EGD 05/28/2023: -Variable Z-line, s/p biopsy - Gastritis s/p biopsy - Small hiatal hernia - Normal duodenum - PPI  changes noted in the stomach, negative for H. pylori.  GE junction biopsy with mild chronic carditis and hyperplastic changes  ED visit for epigastric pain 06/16/2023.  Labs with sodium 133, potassium 3.2, creatinine 1.54.  Normal LFTs.  Lipase elevated at 103.  CT scan revealed persistent circumferential thickening of the distal esophageal wall likely related to reflux.  Hiatal hernia seen again.  No pancreatic ductal dilation or surrounding inflammatory changes seen.  Scattered diverticular change of the colon noted.  Second and third portions of the duodenum again demonstrate circumferential thickening and a somewhat mottled attenuation pattern.  Radiology suspected this could be due to a degree of groove pancreatitis given elevated lipase on day of exam.  No evidence of diverticulitis.  He was given GI cocktail and hypokalemia treated.  He was given Carafate on discharge as well as Zofran.  Today:  Has been having upper abdominal pain everyday. He gets nauseas and will take Zofran prior to feeling like he will vomit - uses 1-2 times per day.  He will take the Carafate about 3 times per day to help with pain. Still able to eat - eats oatmeal, cereal, chicken salad, and soup. Staying away from fried foods. Still smoking - about 1 ppd. Still has alcohol about twice per week (1 can - 12 oz).  Denies any dysphagia.  Does report some intermittent bloating/gassiness.  Rarely has any vomiting.  Going only about 1-2 times per week. Does have to strain. Stools are hard as well.    Current Outpatient Medications  Medication Sig Dispense Refill   ACCU-CHEK GUIDE test strip USE AS DIRECTED TO CHECK BLOOD SUGAR ONCE DAILY. 50 strip 0   ACCU-CHEK GUIDE test strip USE TO CHECK BLOOD SUGAR ONCE DAILY 100 strip 0   ACCU-CHEK GUIDE test strip USE TO CHECK BLOOD SUGAR ONCE DAILY 100 strip 0   Accu-Chek Softclix Lancets lancets TEST BLOOD SUGAR ONCE DAILY AS DIRECTED. 100 each 5   acetaminophen (TYLENOL) 500 MG  tablet Take 500-1,000 mg by mouth every 6 (six) hours as needed for headache.     albuterol (VENTOLIN HFA) 108 (90 Base) MCG/ACT inhaler INHALE 2 PUFFS INTO THE LUNGS EVERY 6 HOURS AS NEEDED FOR WHEEZING OR SHORTNESS OF BREATH. 8.5 g 0   aspirin 81 MG EC tablet Take 81 mg by mouth daily. Swallow whole.     Blood Glucose Monitoring Suppl (ACCU-CHEK AVIVA PLUS) w/Device KIT accucheck aviva meter Once daily testing DX e11.9 1 kit 0   busPIRone (BUSPAR) 10 MG tablet Take 1 tablet (10 mg total) by mouth 3 (three) times daily. 90 tablet 2   calcium carbonate (TUMS - DOSED IN MG ELEMENTAL CALCIUM) 500 MG chewable tablet Chew 1 tablet by mouth daily as needed for indigestion or heartburn.     carvedilol (COREG) 6.25 MG tablet Take 6.25 mg by mouth 2 (two) times daily with a meal.     docusate sodium (COLACE) 100 MG capsule Take 100 mg by mouth daily.     FARXIGA 5 MG TABS tablet Take 5 mg by mouth every morning.     fenofibrate (TRICOR) 145 MG tablet Take 1 tablet (145 mg total) by mouth daily. 30 tablet 3  glipiZIDE (GLUCOTROL XL) 10 MG 24 hr tablet TAKE (2) TABLETS BY MOUTH DAILY EVERY MORNING AT BREAKFAST. 60 tablet 0   ipratropium-albuterol (DUONEB) 0.5-2.5 (3) MG/3ML SOLN INHALE 1 VIAL VIA NEBULIZER EVERY 4 TO 6 HOURS. 540 mL 3   Lancets 30G MISC Once daily testing dx e11.9 100 each 5   LINZESS 72 MCG capsule TAKE ONE CAPSULE BY MOUTH ONCE DAILY BEFORE BREAKFAST. 30 capsule 0   meloxicam (MOBIC) 7.5 MG tablet TAKE 1 TABLET BY MOUTH ONCE DAILY. 30 tablet 0   montelukast (SINGULAIR) 10 MG tablet TAKE (1) TABLET BY MOUTH AT BEDTIME. 30 tablet 0   Multiple Vitamins-Minerals (MULTIVITAMIN WITH MINERALS) tablet Take 1 tablet by mouth daily.     nicotine (NICODERM CQ - DOSED IN MG/24 HOURS) 14 mg/24hr patch Place 1 patch (14 mg total) onto the skin daily. 28 patch 1   nicotine (NICODERM CQ) 21 mg/24hr patch Place 1 patch (21 mg total) onto the skin daily. 28 patch 3   ondansetron (ZOFRAN-ODT) 4 MG  disintegrating tablet DISSOLVE 1 TABLET ON TONGUE EVERY 8 HOURS AS NEEDED FOR NAUSEA. 20 tablet 0   pantoprazole (PROTONIX) 40 MG tablet Take 1 tablet (40 mg total) by mouth daily. 90 tablet 3   Polyethyl Glycol-Propyl Glycol 0.4-0.3 % SOLN Place 1 Application into both eyes as needed (dry eyes).     risperiDONE (RISPERDAL) 1 MG tablet TAKE ONE TABLET BY MOUTH IN THE EVENING. TAKE WITH 4MG  TABLET. 90 tablet 1   risperidone (RISPERDAL) 4 MG tablet TAKE ONE TABLET BY MOUTH IN THE EVENING. TAKE WITH 1MG  TABLET. 90 tablet 0   rosuvastatin (CRESTOR) 10 MG tablet TAKE ONE TABLET BY MOUTH ONCE DAILY. 90 tablet 3   sucralfate (CARAFATE) 1 GM/10ML suspension TAKE 2 TEASPOONFULS ( ) BY MOUTH FOUR TIMES A DAY WITH MEALS AND AT BEDTIME 420 mL 0   SYMBICORT 160-4.5 MCG/ACT inhaler INHALE 2 PUFFS INTO THE LUNGS ONCE IN THE MORNING AND AT BEDTIME. 10.2 g 0   tamsulosin (FLOMAX) 0.4 MG CAPS capsule Take 1 capsule (0.4 mg total) by mouth daily after supper. 90 capsule 3   traZODone (DESYREL) 50 MG tablet TAKE ONE TABLET BY MOUTH AT BEDTIME AS NEEDED FOR SLEEP. 90 tablet 0   No current facility-administered medications for this visit.    Past Medical History:  Diagnosis Date   Acute blood loss anemia 01/05/2020   Acute respiratory failure with hypoxia (HCC)    Arthritis    COPD (chronic obstructive pulmonary disease) (HCC)    Depression    Diabetes mellitus    Diabetes mellitus without complication (HCC)    Diverticulitis    Elevated PSA 10/01/2016   Encounter for support and coordination of transition of care 01/14/2020   Hypercholesterolemia    Hyperlipidemia    Hypertension    Insomnia 01/05/2016   Ischemic colitis (HCC) 01/05/2020   Multiple lung nodules on CT 04/02/2015   Nicotine addiction    Obesity    Oxygen deficiency    qhs   Schizophrenia [facility])     Past Surgical History:  Procedure Laterality Date   BIOPSY  05/28/2023   Procedure: BIOPSY;  Surgeon: Lanelle Bal, DO;   Location[facility];  Service: Endoscopy;;   CATARACT EXTRACTION W/PHACO Left 11/20/2013   Procedure: CATARACT EXTRACTION PHACO AND INTRAOCULAR LENS PLACEMENT (IOC);  Surgeon: Gemma Payor, MD;  Location: [facility];  Service: Ophthalmology;  Laterality: Left;  CDE:10.26   CATARACT EXTRACTION W/PHACO Right 12/08/2013   Procedure:  RIGHT EYE CATARACT EXTRACTION PHACO AND INTRAOCULAR LENS PLACEMENT ;  Surgeon: Gemma Payor, MD;  Location: [facility];  Service: Ophthalmology;  Laterality: Right;  CDE 12.38   COLONOSCOPY N/A 04/01/2013   pancolonic diverticulosis, redundant colon, large internal hemorrhoids.    COLONOSCOPY  2014   INCOMPLETE PREP IN R COLON   COLONOSCOPY N/A 04/01/2020   Procedure: COLONOSCOPY;  Surgeon: West Bali, MD;  Location: [facility];  Service: Endoscopy;  Laterality: N/A;  8:45am   ESOPHAGOGASTRODUODENOSCOPY N/A 02/18/2016   stricture at GE junction, moderate erosive gastritis and mild non-erosive duodenitis. +H.pylori. Treated initially with Prevpac. Breath test to check for eradication was positive, and he was prescribed Pylera.    ESOPHAGOGASTRODUODENOSCOPY (EGD) WITH PROPOFOL N/A 05/28/2023   Procedure: ESOPHAGOGASTRODUODENOSCOPY (EGD) WITH PROPOFOL;  Surgeon: Lanelle Bal, DO;  [facility];  Service: Endoscopy;  Laterality: N/A;  11:15am, asa 3   EYE SURGERY Left 11/2013   cataract extraction   POLYPECTOMY  04/01/2020   Procedure: POLYPECTOMY;  Surgeon: West Bali, MD;  Location: [facility];  Service: Endoscopy;;   SAVORY DILATION N/A 02/18/2016   Procedure: SAVORY DILATION;  Surgeon: West Bali, MD;  Location: [facility];  Service: Endoscopy;  Laterality: N/A;   SHOULDER SURGERY Right     Family History  Problem Relation Age of Onset   Diabetes Mother    Hypertension Mother    Stroke Mother    Diabetes Sister    Heart disease Sister    Kidney disease Father    Drug abuse Brother    Colon cancer Neg Hx    Colon polyps Neg Hx      Allergies as of 08/08/2023 - Review Complete 08/08/2023  Allergen Reaction Noted   Sertraline hcl  04/17/2011   Wellbutrin [bupropion] Other (See Comments) 07/29/2015    Social History   Socioeconomic History   Marital status: Divorced    Spouse name: Not on file   Number of children: 2   Years of education: Not on file   Highest education level: Not on file  Occupational History   Occupation: retired from [employer] stage [employer]   Tobacco Use   Smoking status: Every Day    Current packs/day: 1.00    Average packs/day: 1 pack/day for 48.0 years (48.0 ttl pk-yrs)    Types: Cigarettes   Smokeless tobacco: Never  Vaping Use   Vaping status: Never Used  Substance and Sexual Activity   Alcohol use: Yes    Alcohol/week: 0.0 standard drinks of alcohol    Comment: "little bit of beer"   Drug use: No   Sexual activity: Not Currently    Birth control/protection: None  Other Topics Concern   Not on file  Social History Narrative   ** Merged History Encounter **       ** Merged History Encounter **       Social Determinants of Health   Financial Resource Strain: Low Risk  (12/13/2022)   Overall Financial Resource Strain (CARDIA)    Difficulty of Paying Living Expenses: Not very hard  Food Insecurity: No Food Insecurity (11/27/2022)   Hunger Vital Sign    Worried About Running Out of Food in the Last Year: Never true    Ran Out of Food in the Last Year: Never true  Transportation Needs: No Transportation Needs (12/13/2022)   PRAPARE - Administrator, Civil Service (Medical): No    Lack of Transportation (Non-Medical): No  Physical Activity:  Inactive (11/27/2022)   Exercise Vital Sign    Days of Exercise per Week: 0 days    Minutes of Exercise per Session: 0 min  Stress: No Stress Concern Present (11/27/2022)   [employer] of Occupational Health - Occupational Stress Questionnaire    Feeling of Stress : Not at all  Social Connections: Moderately Isolated  (11/27/2022)   Social Connection and Isolation Panel [NHANES]    Frequency of Communication with Friends and Family: More than three times a week    Frequency of Social Gatherings with Friends and Family: More than three times a week    Attends Religious Services: More than 4 times per year    Active Member of [employer] or Organizations: No    Attends Banker Meetings: Never    Marital Status: Divorced     Review of Systems   Gen: Denies fever, chills, anorexia. Denies fatigue, weakness, weight loss.  CV: Denies chest pain, palpitations, syncope, peripheral edema, and claudication. Resp: Denies dyspnea at rest, cough, wheezing, coughing up blood, and pleurisy. GI: See HPI Derm: Denies rash, itching, dry skin Psych: Denies depression, anxiety, memory loss, confusion. No homicidal or suicidal ideation.  Heme: Denies bruising, bleeding, and enlarged lymph nodes.   Physical Exam   BP 122/78 (BP Location: Right Arm, Patient Position: Sitting, Cuff Size: Normal)   Pulse 75   Temp 98 F (36.7 C) (Temporal)   Ht 6\' 1"  (1.854 m)   Wt 213 lb 6.4 oz (96.8 kg)   SpO2 97%   BMI 28.15 kg/m   General:   Alert and oriented. No distress noted. Pleasant and cooperative.  Head:  Normocephalic and atraumatic. Eyes:  Conjuctiva clear without scleral icterus. Mouth:  Oral mucosa pink and moist. Good dentition. No lesions. Lungs:  Clear to auscultation bilaterally. No wheezes, rales, or rhonchi. No distress.  Heart:  S1, S2 present without murmurs appreciated.  Abdomen:  +BS, soft, non-distended.  Mild TTP to epigastrium and periumbilical region.  No rebound or guarding. No HSM or masses noted. Rectal: deferred Msk:  Normal posture.  Weak gait with single-point cane Extremities:  Without edema. Neurologic:  Alert and  oriented x4 Psych:  Alert and cooperative. Normal mood and affect.   Assessment  ABSALOM MACKLEY is a 72 y.o. male with a history of COPD, diabetes, ischemic colitis  in 2021, HLD, HTN, depression, and chronic epigastric pain and constipation presenting today for follow-up.  Esophageal wall thickening on CT: EGD performed 6/10 with a variable Z-line however significant inflammation or concerning malignancy present.  Repeat CT scan in late June for ED visit also revealed chronic esophageal wall thickening which is likely secondary to ongoing reflux.   GERD, nausea, epigastric pain, pancreatitis: Currently on pantoprazole 40 mg once daily as well as Carafate as needed.  He usually takes this about 3 times a day as needed.  He states he has been avoiding fried/fatty foods, primarily eating a softer diet such as cereal, oatmeal, soups, and chicken.  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  He is EGD 6/10 revealed gastritis with normal first and second portion of duodenum.  GE biopsies benign and negative for H. pylori.  Suspect that intermittent alcohol use and ongoing tobacco use is likely contributing to his symptoms as well, especially the pancreatitis.  I will change his PPI today.  Will  stop pantoprazole and start Nexium to see if this helps with epigastric pain and reflux symptoms.  I have refilled Zofran and Carafate today to use as needed.   Adenomatous colon polyps: History of tubular adenomas.  Last colonoscopy in April 2021 commended 3-year repeat.  We have previously discussed the importance of surveillance has continued to decline.  He has previously indicated his understanding about this and the potential malignancy.  Will plan to discuss again at follow-up.  Constipation: Likely from.  Previously taking Dulcolax and other over-the-counter laxatives to help with constipation.  Has been on Linzess 72 mcg once daily but still only having 1-2 bowel movements per week.  We will increase dose to 145 mcg daily.  For now he will take 2 of  his Linzess 72 mcg capsules and I will update prescription.  PLAN   Linzess 145 mcg (take 2 Linzess 72 mcg capsules) Zofran as needed Stop pantoprazole and start Nexium 40 mg once daily Continue carafate as needed.  GERD diet Alcohol and tobacco cessation Follow up in 3 months     Brooke Bonito, MSN, FNP-BC, AGACNP-BC [facility]

## 2023-08-08 NOTE — Patient Instructions (Addendum)
Medication instructions: Continue Linzess 72 mcg.  Start taking 2 capsules once daily in the mornings prior to breakfast. Stop pantoprazole as soon as you are able to get your Nexium.  You will take 1 tablet (40 mg) once daily. I refilled your Zofran.  You can take 1 tablet once daily as needed for nausea.  Taking this too frequently with the other medications can put you at a high risk for a heart arrhythmia. I also refilled your Carafate.  You can continue to use this as needed for burning and discomfort.  Follow a GERD diet:  Avoid fried, fatty, greasy, spicy, citrus foods. Avoid caffeine and carbonated beverages. Avoid chocolate. Try eating 4-6 small meals a day rather than 3 large meals. Do not eat within 3 hours of laying down. Prop head of bed up on wood or bricks to create a 6 inch incline.  Is very important that you avoid alcohol altogether.  You should also continue to work toward quitting smoking or at least reducing.  It is possible that both of these things are contributing to your pain given your prior pancreatitis.  I attached some education to the back of your paperwork today regarding pancreatitis.  Follow-up in 3 months, sooner if needed.  It was a pleasure to see you today. I want to create trusting relationships with patients. If you receive a survey regarding your visit,  I greatly appreciate you taking time to fill this out on paper or through your MyChart. I value your feedback.  Brooke Bonito, MSN, FNP-BC, AGACNP-BC [facility]

## 2023-08-09 ENCOUNTER — Encounter: Payer: Self-pay | Admitting: Family Medicine

## 2023-08-09 DIAGNOSIS — E1159 Type 2 diabetes mellitus with other circulatory complications: Secondary | ICD-10-CM | POA: Insufficient documentation

## 2023-08-09 DIAGNOSIS — E1169 Type 2 diabetes mellitus with other specified complication: Secondary | ICD-10-CM | POA: Insufficient documentation

## 2023-08-09 MED ORDER — GLIPIZIDE ER 10 MG PO TB24: ORAL_TABLET | ORAL | 2 refills | Status: DC

## 2023-08-09 NOTE — Assessment & Plan Note (Signed)
Improved but not adequately controlled increase buspar to 10 mg three times daily

## 2023-08-09 NOTE — Assessment & Plan Note (Signed)
Controlled, no change in medication DASH diet and commitment to daily physical activity for a minimum of 30 minutes discussed and encouraged, as a part of hypertension management. The importance of attaining a healthy weight is also discussed.     08/08/2023    8:43 AM 08/02/2023   11:23 AM 07/05/2023   10:05 AM 06/29/2023   10:44 AM 06/29/2023   10:00 AM 06/19/2023   10:51 AM 06/16/2023    8:45 PM  BP/Weight  Systolic BP 122 128 118 136 127 86 142  Diastolic BP 78 79 81 88 82 56 80  Wt. (Lbs) 213.4 215.04   218.12 218   BMI 28.15 kg/m2 28.37 kg/m2   28.78 kg/m2 28.76 kg/m2

## 2023-08-09 NOTE — Assessment & Plan Note (Signed)
Matthew Molina is reminded of the importance of commitment to daily physical activity for 30 minutes or more, as able and the need to limit carbohydrate intake to 30 to 60 grams per meal to help with blood sugar control.   The need to take medication as prescribed, test blood sugar as directed, and to call between visits if there is a concern that blood sugar is uncontrolled is also discussed.   Matthew Molina is reminded of the importance of daily foot exam, annual eye examination, and good blood sugar, blood pressure and cholesterol control.     Latest Ref Rng & Units 06/29/2023   11:04 AM 06/19/2023   11:42 AM 06/16/2023    6:44 PM 04/27/2023    9:21 AM 04/22/2023    4:04 PM  Diabetic Labs  HbA1c 4.8 - 5.6 %    7.1    Chol 100 - 199 mg/dL    161    HDL >09 mg/dL    33    Calc LDL 0 - 99 mg/dL    72    Triglycerides 0 - 149 mg/dL    604    Creatinine 5.40 - 1.27 mg/dL 9.81  1.91  4.78  2.95  1.53       08/08/2023    8:43 AM 08/02/2023   11:23 AM 07/05/2023   10:05 AM 06/29/2023   10:44 AM 06/29/2023   10:00 AM 06/19/2023   10:51 AM 06/16/2023    8:45 PM  BP/Weight  Systolic BP 122 128 118 136 127 86 142  Diastolic BP 78 79 81 88 82 56 80  Wt. (Lbs) 213.4 215.04   218.12 218   BMI 28.15 kg/m2 28.37 kg/m2   28.78 kg/m2 28.76 kg/m2       Latest Ref Rng & Units 02/20/2023   12:00 AM 09/19/2022    9:00 AM  Foot/eye exam completion dates  Eye Exam No Retinopathy No Retinopathy       Foot Form Completion   Done     This result is from an external source.     Updated lab needed at/ before next visit.

## 2023-08-09 NOTE — Assessment & Plan Note (Signed)
Asked:confirms currently smokes cigarettes Assess: Unwilling to set a quit date, but is wanting to and trying to  cut back Advise: needs to QUIT to reduce risk of cancer, cardio and cerebrovascular disease Assist: counseled for 5 minutes and literature provided Arrange: follow up in 2 to 4 months

## 2023-08-09 NOTE — Assessment & Plan Note (Signed)
Uncontrolled, and increased depo medrol 40 mg IM in office

## 2023-08-09 NOTE — Assessment & Plan Note (Signed)
Hyperlipidemia:Low fat diet discussed and encouraged.   Lipid Panel  Lab Results  Component Value Date   CHOL 133 04/27/2023   HDL 33 (L) 04/27/2023   LDLCALC 72 04/27/2023   TRIG 165 (H) 04/27/2023   CHOLHDL 4.0 04/27/2023     Updated lab needed at/ before next visit.

## 2023-08-09 NOTE — Progress Notes (Signed)
[name]     MRN: [id number]      DOB: [birth year]  Chief Complaint  Patient presents with   Follow-up    Follow up requesting cortisone shot    HPI Matthew Molina is here for follow up and re-evaluation of chronic medical conditions, medication management and review of any available recent lab and radiology data.  Preventive health is updated, specifically  Cancer screening and Immunization.   Questions or concerns regarding consultations or procedures which the PT has had in the interim are  addressed. The PT denies any adverse reactions to current medications since the last visit.  C/o increased and uncontrolled hip pain , right worse than left, want shot Still smoking want s to but unable to work on quitting now Anxiety improved but still more than usual, no hallucinations Denies polyuria, polydipsia, blurred vision , or hypoglycemic episodes. Still c/o mild abdominal pain, rescheduled gI follow up  ROS Denies recent fever or chills. Denies sinus pressure, nasal congestion, ear pain or sore throat. Denies chest congestion, productive cough or wheezing. Denies chest pains, palpitations and leg swelling .   Denies dysuria, frequency, hesitancy or incontinence. Denies skin break down or rash.   PE  BP 128/79 (BP Location: Right Arm, Patient Position: Sitting, Cuff Size: Normal)   Pulse 81   Ht 6\' 1"  (1.854 m)   Wt 215 lb 0.6 oz (97.5 kg)   SpO2 94%   BMI 28.37 kg/m   Patient alert and oriented and in no cardiopulmonary distress.  HEENT: No facial asymmetry, EOMI,     Neck supple .  Chest: Clear to auscultation bilaterally.  CVS: S1, S2 no murmurs, no S3.Regular rate.  ABD: Soft mildly tender Ext: No edema  DD:UKGURKYHC  ROM  lumbar spine, and hips and knees.  Skin: Intact, no ulcerations or rash noted.  Psych: Good eye contact, normal affect. Memory intact mildly  anxious not  depressed appearing.  CNS: CN 2-12 intact, power,  normal throughout.no focal  deficits noted.   Assessment & Plan  Hip pain, right Uncontrolled, and increased depo medrol 40 mg IM in office  GAD (generalized anxiety disorder) Improved but not adequately controlled increase buspar to 10 mg three times daily  Hypertension Controlled, no change in medication DASH diet and commitment to daily physical activity for a minimum of 30 minutes discussed and encouraged, as a part of hypertension management. The importance of attaining a healthy weight is also discussed.     08/08/2023    8:43 AM 08/02/2023   11:23 AM 07/05/2023   10:05 AM 06/29/2023   10:44 AM 06/29/2023   10:00 AM 06/19/2023   10:51 AM 06/16/2023    8:45 PM  BP/Weight  Systolic BP 122 128 118 136 127 86 142  Diastolic BP 78 79 81 88 82 56 80  Wt. (Lbs) 213.4 215.04   218.12 218   BMI 28.15 kg/m2 28.37 kg/m2   28.78 kg/m2 28.76 kg/m2        Acute pancreatitis Improving symptoms, needs gI follow up  Tobacco abuse Asked:confirms currently smokes cigarettes Assess: Unwilling to set a quit date, but is wanting to and trying to  cut back Advise: needs to QUIT to reduce risk of cancer, cardio and cerebrovascular disease Assist: counseled for 5 minutes and literature provided Arrange: follow up in 2 to 4 months   Hyperlipidemia Hyperlipidemia:Low fat diet discussed and encouraged.   Lipid Panel  Lab Results  Component Value Date   CHOL  133 04/27/2023   HDL 33 (L) 04/27/2023   LDLCALC 72 04/27/2023   TRIG 165 (H) 04/27/2023   CHOLHDL 4.0 04/27/2023     Updated lab needed at/ before next visit.   Type 2 diabetes mellitus with vascular disease [facility]) Matthew Molina is reminded of the importance of commitment to daily physical activity for 30 minutes or more, as able and the need to limit carbohydrate intake to 30 to 60 grams per meal to help with blood sugar control.   The need to take medication as prescribed, test blood sugar as directed, and to call between visits if there is a concern that  blood sugar is uncontrolled is also discussed.   Matthew Molina is reminded of the importance of daily foot exam, annual eye examination, and good blood sugar, blood pressure and cholesterol control.     Latest Ref Rng & Units 06/29/2023   11:04 AM 06/19/2023   11:42 AM 06/16/2023    6:44 PM 04/27/2023    9:21 AM 04/22/2023    4:04 PM  Diabetic Labs  HbA1c 4.8 - 5.6 %    7.1    Chol 100 - 199 mg/dL    161    HDL >09 mg/dL    33    Calc LDL 0 - 99 mg/dL    72    Triglycerides 0 - 149 mg/dL    604    Creatinine 5.40 - 1.27 mg/dL 9.81  1.91  4.78  2.95  1.53       08/08/2023    8:43 AM 08/02/2023   11:23 AM 07/05/2023   10:05 AM 06/29/2023   10:44 AM 06/29/2023   10:00 AM 06/19/2023   10:51 AM 06/16/2023    8:45 PM  BP/Weight  Systolic BP 122 128 118 136 127 86 142  Diastolic BP 78 79 81 88 82 56 80  Wt. (Lbs) 213.4 215.04   218.12 218   BMI 28.15 kg/m2 28.37 kg/m2   28.78 kg/m2 28.76 kg/m2       Latest Ref Rng & Units 02/20/2023   12:00 AM 09/19/2022    9:00 AM  Foot/eye exam completion dates  Eye Exam No Retinopathy No Retinopathy       Foot Form Completion   Done     This result is from an external source.     Updated lab needed at/ before next visit.

## 2023-08-09 NOTE — Assessment & Plan Note (Signed)
Improving symptoms, needs gI follow up

## 2023-08-16 ENCOUNTER — Other Ambulatory Visit: Payer: Self-pay | Admitting: Family Medicine

## 2023-08-16 DIAGNOSIS — J449 Chronic obstructive pulmonary disease, unspecified: Secondary | ICD-10-CM

## 2023-08-16 DIAGNOSIS — M25552 Pain in left hip: Secondary | ICD-10-CM

## 2023-08-21 ENCOUNTER — Other Ambulatory Visit: Payer: Self-pay | Admitting: Family Medicine

## 2023-08-28 ENCOUNTER — Other Ambulatory Visit: Payer: Self-pay | Admitting: Gastroenterology

## 2023-08-30 ENCOUNTER — Telehealth: Payer: Self-pay | Admitting: Family Medicine

## 2023-08-30 NOTE — Telephone Encounter (Signed)
Patient called in regard to rosuvastatin (CRESTOR) 10 MG tablet [[id number]]    Wants to know if he should be taking this medication or not.  Wants a call back.

## 2023-08-30 NOTE — Telephone Encounter (Signed)
Advised patient to reach out to Cardiology which has been managing.

## 2023-09-02 DIAGNOSIS — J449 Chronic obstructive pulmonary disease, unspecified: Secondary | ICD-10-CM | POA: Diagnosis not present

## 2023-09-04 ENCOUNTER — Other Ambulatory Visit: Payer: 59

## 2023-09-05 ENCOUNTER — Ambulatory Visit: Payer: 59 | Admitting: Podiatry

## 2023-09-08 ENCOUNTER — Emergency Department (HOSPITAL_COMMUNITY)
Admission: EM | Admit: 2023-09-08 | Discharge: 2023-09-08 | Disposition: A | Discharge status: Home / Self Care | Payer: 59 | Attending: Emergency Medicine | Admitting: Emergency Medicine

## 2023-09-08 ENCOUNTER — Emergency Department (HOSPITAL_COMMUNITY): Payer: 59

## 2023-09-08 ENCOUNTER — Other Ambulatory Visit: Payer: Self-pay

## 2023-09-08 ENCOUNTER — Encounter (HOSPITAL_COMMUNITY): Payer: Self-pay

## 2023-09-08 DIAGNOSIS — Z79899 Other long term (current) drug therapy: Secondary | ICD-10-CM | POA: Diagnosis not present

## 2023-09-08 DIAGNOSIS — Z7984 Long term (current) use of oral hypoglycemic drugs: Secondary | ICD-10-CM | POA: Diagnosis not present

## 2023-09-08 DIAGNOSIS — G4489 Other headache syndrome: Secondary | ICD-10-CM | POA: Diagnosis not present

## 2023-09-08 DIAGNOSIS — R0689 Other abnormalities of breathing: Secondary | ICD-10-CM | POA: Diagnosis not present

## 2023-09-08 DIAGNOSIS — I10 Essential (primary) hypertension: Secondary | ICD-10-CM | POA: Insufficient documentation

## 2023-09-08 DIAGNOSIS — Z7982 Long term (current) use of aspirin: Secondary | ICD-10-CM | POA: Diagnosis not present

## 2023-09-08 DIAGNOSIS — K439 Ventral hernia without obstruction or gangrene: Secondary | ICD-10-CM | POA: Diagnosis not present

## 2023-09-08 DIAGNOSIS — K2990 Gastroduodenitis, unspecified, without bleeding: Secondary | ICD-10-CM

## 2023-09-08 DIAGNOSIS — E119 Type 2 diabetes mellitus without complications: Secondary | ICD-10-CM | POA: Insufficient documentation

## 2023-09-08 DIAGNOSIS — R1013 Epigastric pain: Secondary | ICD-10-CM

## 2023-09-08 DIAGNOSIS — K2970 Gastritis, unspecified, without bleeding: Secondary | ICD-10-CM | POA: Diagnosis not present

## 2023-09-08 DIAGNOSIS — K5730 Diverticulosis of large intestine without perforation or abscess without bleeding: Secondary | ICD-10-CM | POA: Diagnosis not present

## 2023-09-08 DIAGNOSIS — K2980 Duodenitis without bleeding: Secondary | ICD-10-CM | POA: Insufficient documentation

## 2023-09-08 DIAGNOSIS — R109 Unspecified abdominal pain: Secondary | ICD-10-CM | POA: Diagnosis not present

## 2023-09-08 DIAGNOSIS — Z743 Need for continuous supervision: Secondary | ICD-10-CM | POA: Diagnosis not present

## 2023-09-08 LAB — COMPREHENSIVE METABOLIC PANEL
ALT: 20 U/L (ref 0–44)
AST: 17 U/L (ref 15–41)
Albumin: 4.2 g/dL (ref 3.5–5.0)
Alkaline Phosphatase: 31 U/L — ABNORMAL LOW (ref 38–126)
Anion gap: 12 (ref 5–15)
BUN: 20 mg/dL (ref 8–23)
CO2: 26 mmol/L (ref 22–32)
Calcium: 11.2 mg/dL — ABNORMAL HIGH (ref 8.9–10.3)
Chloride: 97 mmol/L — ABNORMAL LOW (ref 98–111)
Creatinine, Ser: 1.45 mg/dL — ABNORMAL HIGH (ref 0.61–1.24)
GFR, Estimated: 51 mL/min — ABNORMAL LOW (ref >60)
Glucose, Bld: 162 mg/dL — ABNORMAL HIGH (ref 70–99)
Potassium: 3.3 mmol/L — ABNORMAL LOW (ref 3.5–5.1)
Sodium: 135 mmol/L (ref 135–145)
Total Bilirubin: 0.9 mg/dL (ref 0.3–1.2)
Total Protein: 7.8 g/dL (ref 6.5–8.1)

## 2023-09-08 LAB — CBC WITH DIFFERENTIAL/PLATELET
Abs Immature Granulocytes: 0.03 10*3/uL (ref 0.00–0.07)
Basophils Absolute: 0 10*3/uL (ref 0.0–0.1)
Basophils Relative: 0 %
Eosinophils Absolute: 0 10*3/uL (ref 0.0–0.5)
Eosinophils Relative: 0 %
HCT: 45.4 % (ref 39.0–52.0)
Hemoglobin: 14.7 g/dL (ref 13.0–17.0)
Immature Granulocytes: 0 %
Lymphocytes Relative: 16 %
Lymphs Abs: 1.3 10*3/uL (ref 0.7–4.0)
MCH: 29.3 pg (ref 26.0–34.0)
MCHC: 32.4 g/dL (ref 30.0–36.0)
MCV: 90.4 fL (ref 80.0–100.0)
Monocytes Absolute: 0.3 10*3/uL (ref 0.1–1.0)
Monocytes Relative: 4 %
Neutro Abs: 6.6 10*3/uL (ref 1.7–7.7)
Neutrophils Relative %: 80 %
Platelets: 220 10*3/uL (ref 150–400)
RBC: 5.02 MIL/uL (ref 4.22–5.81)
RDW: 14.7 % (ref 11.5–15.5)
WBC: 8.3 10*3/uL (ref 4.0–10.5)
nRBC: 0 % (ref 0.0–0.2)

## 2023-09-08 LAB — URINALYSIS, ROUTINE W REFLEX MICROSCOPIC
Bilirubin Urine: NEGATIVE
Glucose, UA: >=500 mg/dL — AB
Hgb urine dipstick: NEGATIVE
Ketones, ur: 5 mg/dL — AB
Leukocytes,Ua: NEGATIVE
Nitrite: NEGATIVE
Protein, ur: 100 mg/dL — AB
Specific Gravity, Urine: >1.046 — ABNORMAL HIGH (ref 1.005–1.030)
pH: 7 (ref 5.0–8.0)

## 2023-09-08 MED ORDER — PANTOPRAZOLE SODIUM 40 MG IV SOLR
40.0000 mg | INTRAVENOUS | Status: AC
  Administered 2023-09-08: 40 mg via INTRAVENOUS
  Filled 2023-09-08: qty 10

## 2023-09-08 MED ORDER — ESOMEPRAZOLE MAGNESIUM 40 MG PO CPDR: 40.0000 mg | DELAYED_RELEASE_CAPSULE | Freq: Every day | ORAL | 2 refills | Status: DC

## 2023-09-08 MED ORDER — MORPHINE SULFATE (PF) 4 MG/ML IV SOLN
4.0000 mg | Freq: Once | INTRAVENOUS | Status: AC
  Administered 2023-09-08: 4 mg via INTRAVENOUS
  Filled 2023-09-08: qty 1

## 2023-09-08 MED ORDER — IOHEXOL 300 MG/ML  SOLN
100.0000 mL | Freq: Once | INTRAMUSCULAR | Status: AC | PRN
  Administered 2023-09-08: 100 mL via INTRAVENOUS

## 2023-09-08 MED ORDER — SODIUM CHLORIDE 0.9 % IV BOLUS
1000.0000 mL | Freq: Once | INTRAVENOUS | Status: AC
  Administered 2023-09-08: 1000 mL via INTRAVENOUS

## 2023-09-08 MED ORDER — ONDANSETRON HCL 4 MG/2ML IJ SOLN
4.0000 mg | Freq: Once | INTRAMUSCULAR | Status: AC
  Administered 2023-09-08: 4 mg via INTRAVENOUS
  Filled 2023-09-08: qty 2

## 2023-09-08 NOTE — ED Provider Notes (Signed)
[place] EMERGENCY DEPARTMENT AT [facility] Provider Note   CSN: [id number] Arrival date & time: 09/08/23  1344     History  Chief Complaint  Patient presents with   Abdominal Pain    Matthew Molina is a 72 y.o. male.   Abdominal Pain  This patient is a 72 year old male, he is a diabetic, he has a history of hypertension and high cholesterol, presents with several days of abdominal discomfort with occasional diarrhea, states the pain radiates to his back, nothing seems to make it better or worse, he states there is no vomiting, states he does have some liquid stools, he comes in by ambulance with these complaints with vital signs showing mild tachycardia.    Home Medications Prior to Admission medications   Medication Sig Start Date End Date Taking? Authorizing Provider  ACCU-CHEK GUIDE test strip USE AS DIRECTED TO CHECK BLOOD SUGAR ONCE DAILY. 11/18/21   Kerri Perches, MD  ACCU-CHEK GUIDE test strip USE TO CHECK BLOOD SUGAR ONCE DAILY 04/02/23   Kerri Perches, MD  ACCU-CHEK GUIDE test strip USE TO CHECK BLOOD SUGAR ONCE DAILY 07/31/23   Kerri Perches, MD  Accu-Chek Softclix Lancets lancets TEST BLOOD SUGAR ONCE DAILY AS DIRECTED. 07/07/19   Kerri Perches, MD  acetaminophen (TYLENOL) 500 MG tablet Take 500-1,000 mg by mouth every 6 (six) hours as needed for headache.    [provider]  albuterol (VENTOLIN HFA) 108 (90 Base) MCG/ACT inhaler INHALE 2 PUFFS INTO THE LUNGS EVERY 6 HOURS AS NEEDED FOR WHEEZING OR SHORTNESS OF BREATH. 08/16/23   Kerri Perches, MD  aspirin 81 MG EC tablet Take 81 mg by mouth daily. Swallow whole.    [provider]  Blood Glucose Monitoring Suppl (ACCU-CHEK AVIVA PLUS) w/Device KIT accucheck aviva meter Once daily testing DX e11.9 07/31/23   Kerri Perches, MD  busPIRone (BUSPAR) 10 MG tablet Take 1 tablet (10 mg total) by mouth 3 (three) times daily. 08/02/23   Kerri Perches, MD  calcium  carbonate (TUMS - DOSED IN MG ELEMENTAL CALCIUM) 500 MG chewable tablet Chew 1 tablet by mouth daily as needed for indigestion or heartburn.    [provider]  carvedilol (COREG) 6.25 MG tablet Take 6.25 mg by mouth 2 (two) times daily with a meal.    [provider]  docusate sodium (COLACE) 100 MG capsule Take 100 mg by mouth daily.    [provider]  esomeprazole (NEXIUM) 40 MG capsule Take 1 capsule (40 mg total) by mouth daily at 12 noon. 09/08/23   Eber Hong, MD  FARXIGA 5 MG TABS tablet Take 5 mg by mouth every morning. 07/27/22   [provider]  fenofibrate (TRICOR) 145 MG tablet Take 1 tablet (145 mg total) by mouth daily. 08/02/23   Kerri Perches, MD  glipiZIDE (GLUCOTROL XL) 10 MG 24 hr tablet TAKE (2) TABLETS BY MOUTH DAILY EVERY MORNING AT BREAKFAST. 08/09/23   Kerri Perches, MD  ipratropium-albuterol (DUONEB) 0.5-2.5 (3) MG/3ML SOLN INHALE 1 VIAL VIA NEBULIZER EVERY 4 TO 6 HOURS. 06/27/23   Kerri Perches, MD  Lancets 30G MISC Once daily testing dx e11.9 11/25/21   Donell Beers, FNP  linaclotide [facility]) 145 MCG CAPS capsule Take 1 capsule (145 mcg total) by mouth daily before breakfast. 08/08/23   Aida Raider, NP  meloxicam (MOBIC) 7.5 MG tablet TAKE 1 TABLET BY MOUTH ONCE DAILY. 08/16/23   Syliva Overman  E, MD  montelukast (SINGULAIR) 10 MG tablet TAKE (1) TABLET BY MOUTH AT BEDTIME. 08/21/23   Kerri Perches, MD  Multiple Vitamins-Minerals (MULTIVITAMIN WITH MINERALS) tablet Take 1 tablet by mouth daily. Patient not taking: Reported on 08/08/2023    [provider]  nicotine (NICODERM CQ - DOSED IN MG/24 HOURS) 14 mg/24hr patch Place 1 patch (14 mg total) onto the skin daily. 12/04/22   Kerri Perches, MD  nicotine (NICODERM CQ) 21 mg/24hr patch Place 1 patch (21 mg total) onto the skin daily. 03/28/23   Kerri Perches, MD  ondansetron (ZOFRAN-ODT) 4 MG disintegrating tablet Take 1 tablet (4 mg  total) by mouth daily as needed for nausea or vomiting. 08/08/23   Aida Raider, NP  Polyethyl Glycol-Propyl Glycol 0.4-0.3 % SOLN Place 1 Application into both eyes as needed (dry eyes).    [provider]  risperiDONE (RISPERDAL) 1 MG tablet TAKE ONE TABLET BY MOUTH IN THE EVENING. TAKE WITH 4MG  TABLET. 06/27/23   Kerri Perches, MD  risperidone (RISPERDAL) 4 MG tablet TAKE ONE TABLET BY MOUTH IN THE EVENING. TAKE WITH 1MG  TABLET. 07/20/23   Kerri Perches, MD  rosuvastatin (CRESTOR) 10 MG tablet TAKE ONE TABLET BY MOUTH ONCE DAILY. 07/12/23   Pricilla Riffle, MD  sucralfate (CARAFATE) 1 GM/10ML suspension TAKE 2 TEASPOONFULS ( ) BY MOUTH 3 TIMES A DAILY AS NEEDED. 08/28/23   Aida Raider, NP  SYMBICORT 160-4.5 MCG/ACT inhaler INHALE 2 PUFFS INTO THE LUNGS ONCE IN THE MORNING AND AT BEDTIME. 08/16/23   Kerri Perches, MD  tamsulosin (FLOMAX) 0.4 MG CAPS capsule Take 1 capsule (0.4 mg total) by mouth daily after supper. 08/08/23   McKenzie, Mardene Celeste, MD  traZODone (DESYREL) 50 MG tablet TAKE ONE TABLET BY MOUTH AT BEDTIME AS NEEDED FOR SLEEP. 07/20/23   Kerri Perches, MD  triamterene-hydrochlorothiazide (MAXZIDE-25) 37.5-25 MG tablet TAKE (1/2) TABLET BY MOUTH ONCE DAILY. 08/21/23   Kerri Perches, MD  sildenafil (VIAGRA) 100 MG tablet Take 100 mg by mouth. Take one tablet 30 mins before intercourse   03/11/12  [provider]      Allergies    Sertraline hcl and Wellbutrin [bupropion]    Review of Systems   Review of Systems  Gastrointestinal:  Positive for abdominal pain.  All other systems reviewed and are negative.   Physical Exam Updated Vital Signs BP (!) 138/98   Pulse 93   Temp 99.2 F (37.3 C)   Resp 20   Wt 96 kg   SpO2 92%   BMI 27.92 kg/m  Physical Exam Vitals and nursing note reviewed.  Constitutional:      General: He is not in acute distress.    Appearance: He is well-developed.  HENT:     Head: Normocephalic and  atraumatic.     Mouth/Throat:     Pharynx: No oropharyngeal exudate.  Eyes:     General: No scleral icterus.       Right eye: No discharge.        Left eye: No discharge.     Conjunctiva/sclera: Conjunctivae normal.     Pupils: Pupils are equal, round, and reactive to light.  Neck:     Thyroid: No thyromegaly.     Vascular: No JVD.  Cardiovascular:     Rate and Rhythm: Normal rate and regular rhythm.     Heart sounds: Normal heart sounds. No murmur heard.    No friction rub. No gallop.  Pulmonary:     Effort: Pulmonary effort is normal. No respiratory distress.     Breath sounds: Normal breath sounds. No wheezing or rales.  Abdominal:     General: Bowel sounds are normal. There is no distension.     Palpations: Abdomen is soft. There is no mass.     Tenderness: There is abdominal tenderness.     Comments: Minimal diffuse abdominal tenderness but absolutely no focal tenderness guarding or peritoneal signs, no masses palpated, no pulsating mass.  Musculoskeletal:        General: No tenderness. Normal range of motion.     Cervical back: Normal range of motion and neck supple.     Right lower leg: No edema.     Left lower leg: No edema.  Lymphadenopathy:     Cervical: No cervical adenopathy.  Skin:    General: Skin is warm and dry.     Findings: No erythema or rash.  Neurological:     Mental Status: He is alert.     Coordination: Coordination normal.  Psychiatric:        Behavior: Behavior normal.     ED Results / Procedures / Treatments   Labs (all labs ordered are listed, but only abnormal results are displayed) Labs Reviewed  COMPREHENSIVE METABOLIC PANEL - Abnormal; Notable for the following components:      Result Value   Potassium 3.3 (*)    Chloride 97 (*)    Glucose, Bld 162 (*)    Creatinine, Ser 1.45 (*)    Calcium 11.2 (*)    Alkaline Phosphatase 31 (*)    GFR, Estimated 51 (*)    All other components within normal limits  URINALYSIS, ROUTINE W REFLEX  MICROSCOPIC - Abnormal; Notable for the following components:   Specific Gravity, Urine >1.046 (*)    Glucose, UA >=500 (*)    Ketones, ur 5 (*)    Protein, ur 100 (*)    Bacteria, UA RARE (*)    All other components within normal limits  CBC WITH DIFFERENTIAL/PLATELET    EKG None  Radiology CT ABDOMEN PELVIS W CONTRAST  Result Date: 09/08/2023 CLINICAL DATA:  Abdominal pain with radiation to back for 2 weeks. EXAM: CT ABDOMEN AND PELVIS WITH CONTRAST TECHNIQUE: Multidetector CT imaging of the abdomen and pelvis was performed using the standard protocol following bolus administration of intravenous contrast. RADIATION DOSE REDUCTION: This exam was performed according to the departmental dose-optimization program which includes automated exposure control, adjustment of the mA and/or kV according to patient size and/or use of iterative reconstruction technique. CONTRAST:  OMNIPAQUE IOHEXOL 300 MG/ML  SOLN COMPARISON:  06/16/2023 FINDINGS: Lower Chest: No acute findings. Hepatobiliary: No suspicious hepatic masses identified. Gallbladder is unremarkable. No evidence of biliary ductal dilatation. Pancreas:  No mass or inflammatory changes. Spleen: Within normal limits in size and appearance. Adrenals/Urinary Tract: No suspicious masses identified. No evidence of ureteral calculi or hydronephrosis. Stomach/Bowel: Stable wall thickening of distal thoracic esophagus. Distention of the stomach and proximal duodenum is seen. Moderate wall thickening and adjacent soft tissue stranding is seen involving the descending duodenum, which may be due to duodenitis or peptic ulcer disease. No evidence of free intraperitoneal air. No evidence of bowel obstruction. Diffuse colonic diverticulosis is noted,, however, there is no evidence of diverticulitis. Normal appendix visualized. Vascular/Lymphatic: No pathologically enlarged lymph nodes. No acute vascular findings. Reproductive:  Stable markedly enlarged  prostate. Other: Tiny paraumbilical ventral hernia is again seen which contains only fat.  Musculoskeletal:  No suspicious bone lesions identified. IMPRESSION: Moderate wall thickening and adjacent soft tissue stranding involving the descending duodenum, which may be due to duodenitis or peptic ulcer disease. No evidence of free intraperitoneal air or abscess. Stable wall thickening of distal thoracic esophagus, suspicious for chronic esophagitis. Colonic diverticulosis, without radiographic evidence of diverticulitis. Stable markedly enlarged prostate. Electronically Signed   By: Danae Orleans M.D.   On: 09/08/2023 18:01    Procedures Procedures    Medications Ordered in ED Medications  pantoprazole (PROTONIX) injection 40 mg (has no administration in time range)  sodium chloride 0.9 % bolus 1,000 mL (1,000 mLs Intravenous New Bag/Given 09/08/23 1634)  ondansetron (ZOFRAN) injection 4 mg (4 mg Intravenous Given 09/08/23 1635)  morphine (PF) 4 MG/ML injection 4 mg (4 mg Intravenous Given 09/08/23 1635)  iohexol (OMNIPAQUE) 300 MG/ML solution 100 mL (100 mLs Intravenous Contrast Given 09/08/23 1729)    ED Course/ Medical Decision Making/ A&P                                 Medical Decision Making Amount and/or Complexity of Data Reviewed Labs: ordered. Radiology: ordered.  Risk Prescription drug management.    This patient presents to the ED for concern of abdominal discomfort, this involves an extensive number of treatment options, and is a complaint that carries with it a high risk of complications and morbidity.  The differential diagnosis includes aneurysm, diverticulitis, bowel obstruction, pancreatitis.  Interestingly the patient did stop a proton pump inhibitor recently and this could just be recurrent gastritis or peptic ulcer disease   Co morbidities that complicate the patient evaluation  Diabetes   Additional history obtained:  Additional history obtained from the  record External records from outside source obtained and reviewed including stress test in 2019, unremarkable Upper endoscopy in June 2024, this showed gastritis and a small hiatal hernia   Lab Tests:  I Ordered, and personally interpreted labs.  The pertinent results include: CBC metabolic panel and urinalysis, overall unremarkable, he does have some glucose and ketones in the urine, he was given some IV fluids.  Creatinine 1.45 which is improved from prior values back in July   Imaging Studies ordered:  I ordered imaging studies including CT scan of the abdomen and pelvis with contrast I independently visualized and interpreted imaging which showed what appears to be duodenitis or peptic ulcer disease, no signs of perforation I agree with the radiologist interpretation   Cardiac Monitoring: / EKG:  The patient was maintained on a cardiac monitor.  I personally viewed and interpreted the cardiac monitored which showed an underlying rhythm of: Normal sinus rhythm   Problem List / ED Course / Critical interventions / Medication management  Patient given Protonix and IV fluids I ordered medication including pantoprazole and normal saline for some dehydration and peptic ulcer disease Reevaluation of the patient after these medicines showed that the patient improved I have reviewed the patients home medicines and have made adjustments as needed   Social Determinants of Health:  None   Test / Admission - Considered:  Considered admission but patient did well and has no surgical findings on workup  I have discussed with the patient at the bedside the results, and the meaning of these results.  They have had opportunity to ask questions,  expressed their understanding to the need for follow-up with primary care physician  Final Clinical Impression(s) / ED Diagnoses Final diagnoses:  Epigastric pain  Gastritis and duodenitis    Rx / DC Orders ED Discharge Orders           Ordered    esomeprazole (NEXIUM) 40 MG capsule  Daily        09/08/23 1946              Eber Hong, MD 09/08/23 1949

## 2023-09-08 NOTE — ED Triage Notes (Addendum)
BIBA c/o generalized abd pain radiating through to back x2 weeks.  Denies n/v/d Pt reports 1 month ago stopped Protonix and started on Nexium.  Also reports linzess was increased .

## 2023-09-08 NOTE — Discharge Instructions (Signed)
It appears that your CT scan shows no acute abnormalities.  It does look like you have some inflammation in your stomach which is why you need to be on your antacid medication.  Please get back on the antacid medication, I have prescribed it for you, take it once a day, take it at the same time every day, you can start it tomorrow morning.  Thank you for allowing [place] to treat you in the emergency department today.  After reviewing your examination and potential testing that was done it appears that you are safe to go home.  I would like for you to follow-up with your doctor within the next several days, have them obtain your records and follow-up with them to review all potential tests and results from your visit.  If you should develop severe or worsening symptoms return to the emergency department immediately

## 2023-09-10 DIAGNOSIS — R400 Somnolence: Secondary | ICD-10-CM | POA: Diagnosis not present

## 2023-09-10 DIAGNOSIS — R000 Tachycardia, unspecified: Secondary | ICD-10-CM | POA: Diagnosis not present

## 2023-09-12 ENCOUNTER — Ambulatory Visit: Payer: 59 | Admitting: Urology

## 2023-09-12 VITALS — BP 96/66 | HR 114

## 2023-09-12 DIAGNOSIS — R339 Retention of urine, unspecified: Secondary | ICD-10-CM | POA: Diagnosis not present

## 2023-09-12 DIAGNOSIS — N401 Enlarged prostate with lower urinary tract symptoms: Secondary | ICD-10-CM | POA: Diagnosis not present

## 2023-09-12 DIAGNOSIS — R972 Elevated prostate specific antigen [PSA]: Secondary | ICD-10-CM

## 2023-09-12 DIAGNOSIS — N138 Other obstructive and reflux uropathy: Secondary | ICD-10-CM | POA: Diagnosis not present

## 2023-09-12 MED ORDER — TAMSULOSIN HCL 0.4 MG PO CAPS
0.4000 mg | ORAL_CAPSULE | Freq: Every day | ORAL | 3 refills | Status: DC
Start: 2023-09-12 — End: 2023-11-10

## 2023-09-12 NOTE — Progress Notes (Signed)
09/12/2023 3:18 PM   Matthew Molina 08-05-1951 [id number]  Referring provider: Kerri Perches, MD [address] [place],  [place] [address]  Elevated PSa and BPH   HPI: Matthew Molina is a 72yo here for followup for elevated PSA and BPH with incomplete emptying. No recent PSA. He has been hospitalized twice in 6 months. IPSS 5 QOL 1 on flomax 0.4mg  daily. Nocturia.   PMH: Past Medical History:  Diagnosis Date   Acute blood loss anemia 01/05/2020   Acute respiratory failure with hypoxia (HCC)    Arthritis    COPD (chronic obstructive pulmonary disease) (HCC)    Depression    Diabetes mellitus    Diabetes mellitus without complication (HCC)    Diverticulitis    Elevated PSA 10/01/2016   Encounter for support and coordination of transition of care 01/14/2020   Hypercholesterolemia    Hyperlipidemia    Hypertension    Insomnia 01/05/2016   Ischemic colitis (HCC) 01/05/2020   Multiple lung nodules on CT 04/02/2015   Nicotine addiction    Obesity    Oxygen deficiency    qhs   Schizophrenia [facility])     Surgical History: Past Surgical History:  Procedure Laterality Date   BIOPSY  05/28/2023   Procedure: BIOPSY;  Surgeon: Lanelle Bal, DO;  [facility];  Service: Endoscopy;;   CATARACT EXTRACTION W/PHACO Left 11/20/2013   Procedure: CATARACT EXTRACTION PHACO AND INTRAOCULAR LENS PLACEMENT (IOC);  Surgeon: Gemma Payor, MD;  Location: [facility];  Service: Ophthalmology;  Laterality: Left;  CDE:10.26   CATARACT EXTRACTION W/PHACO Right 12/08/2013   Procedure: RIGHT EYE CATARACT EXTRACTION PHACO AND INTRAOCULAR LENS PLACEMENT ;  Surgeon: Gemma Payor, MD;  Location: [facility];  Service: Ophthalmology;  Laterality: Right;  CDE 12.38   COLONOSCOPY N/A 04/01/2013   pancolonic diverticulosis, redundant colon, large internal hemorrhoids.    COLONOSCOPY  2014   INCOMPLETE PREP IN R COLON   COLONOSCOPY N/A 04/01/2020   Procedure: COLONOSCOPY;  Surgeon: West Bali, MD;  [facility]: [facility];  Service: Endoscopy;  Laterality: N/A;  8:45am   ESOPHAGOGASTRODUODENOSCOPY N/A 02/18/2016   stricture at GE junction, moderate erosive gastritis and mild non-erosive duodenitis. +H.pylori. Treated initially with Prevpac. Breath test to check for eradication was positive, and he was prescribed Pylera.    ESOPHAGOGASTRODUODENOSCOPY (EGD) WITH PROPOFOL N/A 05/28/2023   Procedure: ESOPHAGOGASTRODUODENOSCOPY (EGD) WITH PROPOFOL;  Surgeon: Lanelle Bal, DO;  [facility];  Service: Endoscopy;  Laterality: N/A;  11:15am, asa 3   EYE SURGERY Left 11/2013   cataract extraction   POLYPECTOMY  04/01/2020   Procedure: POLYPECTOMY;  Surgeon: West Bali, MD;  [facility];  Service: Endoscopy;;   SAVORY DILATION N/A 02/18/2016   Procedure: SAVORY DILATION;  Surgeon: West Bali, MD;  Location[facility];  Service: Endoscopy;  Laterality: N/A;   SHOULDER SURGERY Right     Home Medications:  Allergies as of 09/12/2023       Reactions   Sertraline Hcl    Stomach upset/pain   Wellbutrin [bupropion] Other (See Comments)   Makes stomach hurt        Medication List        Accurate as of September 12, 2023  3:18 PM. If you have any questions, ask your nurse or doctor.          Accu-Chek Aviva Plus w/Device Kit accucheck aviva meter Once daily testing DX e11.9  Accu-Chek Guide test strip Generic drug: glucose blood USE AS DIRECTED TO CHECK BLOOD SUGAR ONCE DAILY.   Accu-Chek Guide test strip Generic drug: glucose blood USE TO CHECK BLOOD SUGAR ONCE DAILY   Accu-Chek Guide test strip Generic drug: glucose blood USE TO CHECK BLOOD SUGAR ONCE DAILY   Accu-Chek Softclix Lancets lancets TEST BLOOD SUGAR ONCE DAILY AS DIRECTED.   Lancets 30G Misc Once daily testing dx e11.9   acetaminophen 500 MG tablet Commonly known as: TYLENOL Take 500-1,000 mg by mouth every 6 (six) hours as needed for headache.    albuterol 108 (90 Base) MCG/ACT inhaler Commonly known as: VENTOLIN HFA INHALE 2 PUFFS INTO THE LUNGS EVERY 6 HOURS AS NEEDED FOR WHEEZING OR SHORTNESS OF BREATH.   aspirin EC 81 MG tablet Take 81 mg by mouth daily. Swallow whole.   busPIRone 10 MG tablet Commonly known as: BUSPAR Take 1 tablet (10 mg total) by mouth 3 (three) times daily.   calcium carbonate 500 MG chewable tablet Commonly known as: TUMS - dosed in mg elemental calcium Chew 1 tablet by mouth daily as needed for indigestion or heartburn.   carvedilol 6.25 MG tablet Commonly known as: COREG Take 6.25 mg by mouth 2 (two) times daily with a meal.   docusate sodium 100 MG capsule Commonly known as: COLACE Take 100 mg by mouth daily.   esomeprazole 40 MG capsule Commonly known as: NEXIUM Take 1 capsule (40 mg total) by mouth daily at 12 noon.   Farxiga 5 MG Tabs tablet Generic drug: dapagliflozin propanediol Take 5 mg by mouth every morning.   fenofibrate 145 MG tablet Commonly known as: TRICOR Take 1 tablet (145 mg total) by mouth daily.   glipiZIDE 10 MG 24 hr tablet Commonly known as: GLUCOTROL XL TAKE (2) TABLETS BY MOUTH DAILY EVERY MORNING AT BREAKFAST.   ipratropium-albuterol 0.5-2.5 (3) MG/3ML Soln Commonly known as: DUONEB INHALE 1 VIAL VIA NEBULIZER EVERY 4 TO 6 HOURS.   linaclotide 145 MCG Caps capsule Commonly known as: Linzess Take 1 capsule (145 mcg total) by mouth daily before breakfast.   meloxicam 7.5 MG tablet Commonly known as: MOBIC TAKE 1 TABLET BY MOUTH ONCE DAILY.   montelukast 10 MG tablet Commonly known as: SINGULAIR TAKE (1) TABLET BY MOUTH AT BEDTIME.   multivitamin with minerals tablet Take 1 tablet by mouth daily.   nicotine 14 mg/24hr patch Commonly known as: NICODERM CQ - dosed in mg/24 hours Place 1 patch (14 mg total) onto the skin daily.   nicotine 21 mg/24hr patch Commonly known as: Nicoderm CQ Place 1 patch (21 mg total) onto the skin daily.    ondansetron 4 MG disintegrating tablet Commonly known as: ZOFRAN-ODT Take 1 tablet (4 mg total) by mouth daily as needed for nausea or vomiting.   Polyethyl Glycol-Propyl Glycol 0.4-0.3 % Soln Place 1 Application into both eyes as needed (dry eyes).   risperiDONE 1 MG tablet Commonly known as: RISPERDAL TAKE ONE TABLET BY MOUTH IN THE EVENING. TAKE WITH 4MG  TABLET.   risperidone 4 MG tablet Commonly known as: RISPERDAL TAKE ONE TABLET BY MOUTH IN THE EVENING. TAKE WITH 1MG  TABLET.   rosuvastatin 10 MG tablet Commonly known as: CRESTOR TAKE ONE TABLET BY MOUTH ONCE DAILY.   sucralfate 1 GM/10ML suspension Commonly known as: CARAFATE TAKE 2 TEASPOONFULS ( ) BY MOUTH 3 TIMES A DAILY AS NEEDED.   Symbicort 160-4.5 MCG/ACT inhaler Generic drug: budesonide-formoterol INHALE 2 PUFFS INTO THE LUNGS ONCE IN THE MORNING AND  AT BEDTIME.   tamsulosin 0.4 MG Caps capsule Commonly known as: FLOMAX Take 1 capsule (0.4 mg total) by mouth daily after supper.   traZODone 50 MG tablet Commonly known as: DESYREL TAKE ONE TABLET BY MOUTH AT BEDTIME AS NEEDED FOR SLEEP.   triamterene-hydrochlorothiazide 37.5-25 MG tablet Commonly known as: MAXZIDE-25 TAKE (1/2) TABLET BY MOUTH ONCE DAILY.        Allergies:  Allergies  Allergen Reactions   Sertraline Hcl     Stomach upset/pain   Wellbutrin [Bupropion] Other (See Comments)    Makes stomach hurt    Family History: Family History  Problem Relation Age of Onset   Diabetes Mother    Hypertension Mother    Stroke Mother    Diabetes Sister    Heart disease Sister    Kidney disease Father    Drug abuse Brother    Colon cancer Neg Hx    Colon polyps Neg Hx     Social History:  reports that he has been smoking cigarettes. He has a 48 pack-year smoking history. He has never used smokeless tobacco. He reports current alcohol use. He reports that he does not use drugs.  ROS: All other review of systems were reviewed and are  negative except what is noted above in HPI  Physical Exam: BP 96/66   Pulse (!) 114   Constitutional:  Alert and oriented, No acute distress. HEENT: [place] AT, moist mucus membranes.  Trachea midline, no masses. Cardiovascular: No clubbing, cyanosis, or edema. Respiratory: Normal respiratory effort, no increased work of breathing. GI: Abdomen is soft, nontender, nondistended, no abdominal masses GU: No CVA tenderness.  Lymph: No cervical or inguinal lymphadenopathy. Skin: No rashes, bruises or suspicious lesions. Neurologic: Grossly intact, no focal deficits, moving all 4 extremities. Psychiatric: Normal mood and affect.  Laboratory Data: Lab Results  Component Value Date   WBC 8.3 09/08/2023   HGB 14.7 09/08/2023   HCT 45.4 09/08/2023   MCV 90.4 09/08/2023   PLT 220 09/08/2023    Lab Results  Component Value Date   CREATININE 1.45 (H) 09/08/2023    Lab Results  Component Value Date   PSA 9.3 (H) 05/26/2020   PSA 5.9 (H) 07/05/2017   PSA 4.96 (H) 06/05/2016    No results found for: "TESTOSTERONE"  Lab Results  Component Value Date   HGBA1C 7.1 (H) 04/27/2023    Urinalysis    Component Value Date/Time   COLORURINE YELLOW 09/08/2023 1837   APPEARANCEUR CLEAR 09/08/2023 1837   APPEARANCEUR Clear 06/26/2022 1102   LABSPEC >1.046 (H) 09/08/2023 1837   PHURINE 7.0 09/08/2023 1837   GLUCOSEU >=500 (A) 09/08/2023 1837   HGBUR NEGATIVE 09/08/2023 1837   HGBUR negative 02/25/2009 1257   BILIRUBINUR NEGATIVE 09/08/2023 1837   BILIRUBINUR Negative 06/26/2022 1102   KETONESUR 5 (A) 09/08/2023 1837   PROTEINUR 100 (A) 09/08/2023 1837   UROBILINOGEN 0.2 02/25/2009 1257   NITRITE NEGATIVE 09/08/2023 1837   LEUKOCYTESUR NEGATIVE 09/08/2023 1837    Lab Results  Component Value Date   LABMICR 9.0 03/28/2023   WBCUA 6-10 (A) 06/26/2022   LABEPIT 0-10 06/26/2022   MUCUS Present 06/26/2022   BACTERIA RARE (A) 09/08/2023    Pertinent Imaging:  No results found for  this or any previous visit.  No results found for this or any previous visit.  No results found for this or any previous visit.  No results found for this or any previous visit.  Results for orders placed during the  hospital encounter of 06/22/22  US RENAL  Narrative CLINICAL DATA:  ckd  EXAM: RENAL / URINARY TRACT ULTRASOUND COMPLETE  COMPARISON:  CT abdomen pelvis 03/10/2020  FINDINGS: Right Kidney:  Renal measurements: 12.2 x 7 x 4.1 cm = volume: 186 mL. Echogenicity within normal limits. Simple appearing cystic lesions measuring up to 4 cm. No mass or hydronephrosis visualized.  Left Kidney:  Renal measurements: 10.9 x 6.8 x 4.6 cm = volume: 221 mL. Echogenicity within normal limits. Simple appearing cystic lesion measuring up to 1.6 cm. No mass or hydronephrosis visualized.  Urinary bladder:  Appears normal for degree of bladder distention. Prevoid volume of 90 mL. Postvoid volume of 63 mL.  Other:  None.  IMPRESSION: 1. Unremarkable renal ultrasound. Simple renal cysts, in the absence of clinically indicated signs/symptoms, require no independent follow-up. 2. Urinary bladder: Prevoid volume of 90 mL. Postvoid volume of 63 mL.   Electronically Signed By: Tish Frederickson M.D. On: 06/22/2022 22:30  No valid procedures specified. No results found for this or any previous visit.  No results found for this or any previous visit.   Assessment & Plan:    1. Elevated PSA -PSA today -followup 1year with PSA - Urinalysis, Routine w reflex microscopic  2. Benign prostatic hyperplasia with urinary obstruction Continue flomax 0.4mg  daily  3. Incomplete emptying of bladder Continue flomax 0.4mg  daily   No follow-ups on file.  Wilkie Aye, MD  [facility] [place]

## 2023-09-13 ENCOUNTER — Other Ambulatory Visit: Payer: Self-pay | Admitting: Gastroenterology

## 2023-09-13 LAB — PSA: Prostate Specific Ag, Serum: 16.7 ng/mL — ABNORMAL HIGH (ref 0.0–4.0)

## 2023-09-14 ENCOUNTER — Other Ambulatory Visit: Payer: Self-pay | Admitting: Family Medicine

## 2023-09-14 DIAGNOSIS — M25551 Pain in right hip: Secondary | ICD-10-CM

## 2023-09-16 ENCOUNTER — Encounter: Payer: Self-pay | Admitting: Urology

## 2023-09-16 NOTE — Patient Instructions (Signed)

## 2023-09-17 ENCOUNTER — Telehealth: Payer: Self-pay | Admitting: Family Medicine

## 2023-09-17 ENCOUNTER — Other Ambulatory Visit: Payer: Self-pay

## 2023-09-17 DIAGNOSIS — E1169 Type 2 diabetes mellitus with other specified complication: Secondary | ICD-10-CM

## 2023-09-17 MED ORDER — ACCU-CHEK GUIDE VI STRP: ORAL_STRIP | 0 refills | Status: DC

## 2023-09-17 MED ORDER — FENOFIBRATE 145 MG PO TABS: 145.0000 mg | ORAL_TABLET | Freq: Every day | ORAL | 3 refills | Status: DC

## 2023-09-17 NOTE — Telephone Encounter (Signed)
Prescription Request  09/17/2023  LOV: 08/02/2023  What is the name of the medication or equipment? ACCU-CHEK GUIDE test strip [[id number]]  fenofibrate (TRICOR) 145 MG tablet [[id number]]     Have you contacted your pharmacy to request a refill? Yes   Which pharmacy would you like this sent to?  [facility] - [place], [place] - [address] [place] [address] Phone: [phone] Fax: [phone]    Patient notified that their request is being sent to the clinical staff for review and that they should receive a response within 2 business days.   Please advise at [facility] [phone]

## 2023-09-17 NOTE — Telephone Encounter (Signed)
Refills sent

## 2023-09-17 NOTE — Telephone Encounter (Signed)
Pharmacy aware testing once daily

## 2023-09-17 NOTE — Telephone Encounter (Signed)
[facility] called in on patient behalf  Needs to know how many times patient is testing blood sugar for test strip refill

## 2023-09-18 ENCOUNTER — Encounter: Payer: Self-pay | Admitting: Family Medicine

## 2023-09-18 ENCOUNTER — Other Ambulatory Visit: Payer: Self-pay

## 2023-09-18 ENCOUNTER — Ambulatory Visit: Payer: 59 | Admitting: Family Medicine

## 2023-09-18 VITALS — BP 113/72 | HR 87 | Ht 73.0 in | Wt 207.0 lb

## 2023-09-18 DIAGNOSIS — Z23 Encounter for immunization: Secondary | ICD-10-CM

## 2023-09-18 DIAGNOSIS — M25552 Pain in left hip: Secondary | ICD-10-CM | POA: Diagnosis not present

## 2023-09-18 DIAGNOSIS — M25551 Pain in right hip: Secondary | ICD-10-CM

## 2023-09-18 DIAGNOSIS — K2980 Duodenitis without bleeding: Secondary | ICD-10-CM | POA: Insufficient documentation

## 2023-09-18 MED ORDER — MELOXICAM 15 MG PO TABS: 15.0000 mg | ORAL_TABLET | Freq: Every day | ORAL | 0 refills | Status: DC

## 2023-09-18 MED ORDER — METHYLPREDNISOLONE ACETATE 80 MG/ML IJ SUSP
40.0000 mg | Freq: Once | INTRAMUSCULAR | Status: AC
Start: 2023-09-18 — End: 2023-09-18
  Administered 2023-09-18: 40 mg via INTRAMUSCULAR

## 2023-09-18 MED ORDER — ACCU-CHEK GUIDE VI STRP: ORAL_STRIP | 0 refills | Status: DC

## 2023-09-18 NOTE — Telephone Encounter (Signed)
Patient calling says [facility] told him we have to reorder his ACCU-CHEK GUIDE test strip [[id number]] Please advise Thank you

## 2023-09-18 NOTE — Telephone Encounter (Signed)
Refills sent

## 2023-09-18 NOTE — Assessment & Plan Note (Signed)
Depo-Medrol injection given today Mobic 15 mg PRN Patient denied physical therapy Explained to patient Non pharmacological interventions include the use of ice or heat, rest, recommend range of motion exercises, gentle stretching.  Follow up for worsening or persistent symptoms. Patient verbalizes understanding regarding plan of care and all questions answered.

## 2023-09-18 NOTE — Patient Instructions (Addendum)
        Great to see you today.  I have refilled the medication(s) we provide.    - Please take medications as prescribed. - Follow up with your primary health provider if any health concerns arises. - If symptoms worsen please contact your primary care provider and/or visit the emergency department.  

## 2023-09-18 NOTE — Progress Notes (Unsigned)
Patient Office Visit   Subjective   Patient ID: Matthew Molina, male    DOB: May 13, 1951  Age: 72 y.o. MRN: [id number]  CC:  Chief Complaint  Patient presents with   Follow-up    Patient was in ED on 9/21 for epigastric pain. States the pain is the same and they discharged him with slurred speech when he was slumped over, given pain meds.     HPI Matthew Molina 72 year old male, presents to the clinic for ER follow up. He  has a past medical history of Acute blood loss anemia (01/05/2020), Acute respiratory failure with hypoxia (HCC), Arthritis, COPD (chronic obstructive pulmonary disease) (HCC), Depression, Diabetes mellitus, Diabetes mellitus without complication (HCC), Diverticulitis, Elevated PSA (10/01/2016), Encounter for support and coordination of transition of care (01/14/2020), Hypercholesterolemia, Hyperlipidemia, Hypertension, Insomnia (01/05/2016), Ischemic colitis (HCC) (01/05/2020), Multiple lung nodules on CT (04/02/2015), Nicotine addiction, Obesity, Oxygen deficiency, and Schizophrenia (HCC).  Hip Pain  Incident onset: Chronic condition. There was no injury mechanism. The pain is present in the left hip and right thigh. The quality of the pain is described as aching, shooting and stabbing. The pain is at a severity of 9/10. The pain has been Fluctuating since onset. Associated symptoms include muscle weakness and numbness. Pertinent negatives include no tingling. He reports no foreign bodies present. The symptoms are aggravated by weight bearing and movement. He has tried acetaminophen, rest and non-weight bearing for the symptoms. The treatment provided mild relief.  Abdominal Pain This is a chronic problem. The problem occurs intermittently. The problem has been waxing and waning. The pain is located in the generalized abdominal region and epigastric region. The pain is at a severity of 6/10. The quality of the pain is colicky and aching. The abdominal pain does not radiate.  Associated symptoms include myalgias. Pertinent negatives include no constipation, diarrhea, fever, flatus, hematochezia, melena, nausea or vomiting. The pain is aggravated by movement. The pain is relieved by Certain positions and liquids. He has tried antacids and proton pump inhibitors for the symptoms. The treatment provided mild relief. Prior diagnostic workup includes GI consult. duodenitis      Outpatient Encounter Medications as of 09/18/2023  Medication Sig   ACCU-CHEK GUIDE test strip USE TO CHECK BLOOD SUGAR ONCE DAILY   ACCU-CHEK GUIDE test strip USE TO CHECK BLOOD SUGAR ONCE DAILY   Accu-Chek Softclix Lancets lancets TEST BLOOD SUGAR ONCE DAILY AS DIRECTED.   acetaminophen (TYLENOL) 500 MG tablet Take 500-1,000 mg by mouth every 6 (six) hours as needed for headache.   albuterol (VENTOLIN HFA) 108 (90 Base) MCG/ACT inhaler INHALE 2 PUFFS INTO THE LUNGS EVERY 6 HOURS AS NEEDED FOR WHEEZING OR SHORTNESS OF BREATH.   aspirin 81 MG EC tablet Take 81 mg by mouth daily. Swallow whole.   Blood Glucose Monitoring Suppl (ACCU-CHEK AVIVA PLUS) w/Device KIT accucheck aviva meter Once daily testing DX e11.9   busPIRone (BUSPAR) 10 MG tablet Take 1 tablet (10 mg total) by mouth 3 (three) times daily.   calcium carbonate (TUMS - DOSED IN MG ELEMENTAL CALCIUM) 500 MG chewable tablet Chew 1 tablet by mouth daily as needed for indigestion or heartburn.   carvedilol (COREG) 6.25 MG tablet Take 6.25 mg by mouth 2 (two) times daily with a meal.   docusate sodium (COLACE) 100 MG capsule Take 100 mg by mouth daily.   esomeprazole (NEXIUM) 40 MG capsule Take 1 capsule (40 mg total) by mouth daily at 12 noon.   [name]  5 MG TABS tablet Take 5 mg by mouth every morning.   fenofibrate (TRICOR) 145 MG tablet Take 1 tablet (145 mg total) by mouth daily.   glipiZIDE (GLUCOTROL XL) 10 MG 24 hr tablet TAKE (2) TABLETS BY MOUTH DAILY EVERY MORNING AT BREAKFAST.   glucose blood (ACCU-CHEK GUIDE) test strip Use as  instructed   ipratropium-albuterol (DUONEB) 0.5-2.5 (3) MG/3ML SOLN INHALE 1 VIAL VIA NEBULIZER EVERY 4 TO 6 HOURS.   Lancets 30G MISC Once daily testing dx e11.9   linaclotide (LINZESS) 145 MCG CAPS capsule Take 1 capsule (145 mcg total) by mouth daily before breakfast.   meloxicam (MOBIC) 15 MG tablet Take 1 tablet (15 mg total) by mouth daily.   montelukast (SINGULAIR) 10 MG tablet TAKE (1) TABLET BY MOUTH AT BEDTIME.   Multiple Vitamins-Minerals (MULTIVITAMIN WITH MINERALS) tablet Take 1 tablet by mouth daily.   nicotine (NICODERM CQ - DOSED IN MG/24 HOURS) 14 mg/24hr patch Place 1 patch (14 mg total) onto the skin daily.   nicotine (NICODERM CQ) 21 mg/24hr patch Place 1 patch (21 mg total) onto the skin daily.   olmesartan (BENICAR) 40 MG tablet TAKE ONE TABLET BY MOUTH DAILY   ondansetron (ZOFRAN-ODT) 4 MG disintegrating tablet DISSOLVE 1 TABLET ON TONGUE ONCE DAILY AS NEEDED.   Polyethyl Glycol-Propyl Glycol 0.4-0.3 % SOLN Place 1 Application into both eyes as needed (dry eyes).   risperiDONE (RISPERDAL) 1 MG tablet TAKE ONE TABLET BY MOUTH IN THE EVENING. TAKE WITH 4MG  TABLET.   risperidone (RISPERDAL) 4 MG tablet TAKE ONE TABLET BY MOUTH IN THE EVENING. TAKE WITH 1MG  TABLET.   rosuvastatin (CRESTOR) 10 MG tablet TAKE ONE TABLET BY MOUTH ONCE DAILY.   sucralfate (CARAFATE) 1 GM/10ML suspension TAKE 2 TEASPOONFULS ( ) BY MOUTH 3 TIMES A DAILY AS NEEDED.   SYMBICORT 160-4.5 MCG/ACT inhaler INHALE 2 PUFFS INTO THE LUNGS ONCE IN THE MORNING AND AT BEDTIME.   tamsulosin (FLOMAX) 0.4 MG CAPS capsule Take 1 capsule (0.4 mg total) by mouth daily after supper.   traZODone (DESYREL) 50 MG tablet TAKE ONE TABLET BY MOUTH AT BEDTIME AS NEEDED FOR SLEEP.   triamterene-hydrochlorothiazide (MAXZIDE-25) 37.5-25 MG tablet TAKE (1/2) TABLET BY MOUTH ONCE DAILY.   [DISCONTINUED] meloxicam (MOBIC) 7.5 MG tablet TAKE 1 TABLET BY MOUTH ONCE DAILY.   [DISCONTINUED] sildenafil (VIAGRA) 100 MG tablet Take 100  mg by mouth. Take one tablet 30 mins before intercourse    Facility-Administered Encounter Medications as of 09/18/2023  Medication   methylPREDNISolone acetate (DEPO-MEDROL) injection 40 mg    Past Surgical History:  Procedure Laterality Date   BIOPSY  05/28/2023   Procedure: BIOPSY;  Surgeon: Lanelle Bal, DO;  Location: [facility];  Service: Endoscopy;;   CATARACT EXTRACTION W/PHACO Left 11/20/2013   Procedure: CATARACT EXTRACTION PHACO AND INTRAOCULAR LENS PLACEMENT (IOC);  Surgeon: Gemma Payor, MD;  Location: [facility];  Service: Ophthalmology;  Laterality: Left;  CDE:10.26   CATARACT EXTRACTION W/PHACO Right 12/08/2013   Procedure: RIGHT EYE CATARACT EXTRACTION PHACO AND INTRAOCULAR LENS PLACEMENT ;  Surgeon: Gemma Payor, MD;  Location: [facility];  Service: Ophthalmology;  Laterality: Right;  CDE 12.38   COLONOSCOPY N/A 04/01/2013   pancolonic diverticulosis, redundant colon, large internal hemorrhoids.    COLONOSCOPY  2014   INCOMPLETE PREP IN R COLON   COLONOSCOPY N/A 04/01/2020   Procedure: COLONOSCOPY;  Surgeon: West Bali, MD;  Location: [facility];  Service: Endoscopy;  Laterality: N/A;  8:45am   ESOPHAGOGASTRODUODENOSCOPY N/A  02/18/2016   stricture at GE junction, moderate erosive gastritis and mild non-erosive duodenitis. +H.pylori. Treated initially with Prevpac. Breath test to check for eradication was positive, and he was prescribed Pylera.    ESOPHAGOGASTRODUODENOSCOPY (EGD) WITH PROPOFOL N/A 05/28/2023   Procedure: ESOPHAGOGASTRODUODENOSCOPY (EGD) WITH PROPOFOL;  Surgeon: Lanelle Bal, DO;  Location: [facility];  Service: Endoscopy;  Laterality: N/A;  11:15am, asa 3   EYE SURGERY Left 11/2013   cataract extraction   POLYPECTOMY  04/01/2020   Procedure: POLYPECTOMY;  Surgeon: West Bali, MD;  Location: [facility];  Service: Endoscopy;;   SAVORY DILATION N/A 02/18/2016   Procedure: SAVORY DILATION;  Surgeon: West Bali, MD;  Location: [facility];  Service: Endoscopy;  Laterality: N/A;   SHOULDER SURGERY Right     Review of Systems  Constitutional:  Negative for chills and fever.  Respiratory:  Negative for shortness of breath.   Cardiovascular:  Negative for chest pain.  Gastrointestinal:  Positive for abdominal pain and heartburn. Negative for blood in stool, constipation, diarrhea, flatus, hematochezia, melena, nausea and vomiting.  Musculoskeletal:  Positive for joint pain and myalgias.  Neurological:  Positive for numbness. Negative for tingling.      Objective    BP 113/72   Pulse 87   Ht 6\' 1"  (1.854 m)   Wt 207 lb (93.9 kg)   SpO2 97%   BMI 27.31 kg/m   Physical Exam Vitals reviewed.  Constitutional:      General: He is not in acute distress.    Appearance: Normal appearance. He is not ill-appearing, toxic-appearing or diaphoretic.  HENT:     Head: Normocephalic.  Eyes:     General:        Right eye: No discharge.        Left eye: No discharge.     Conjunctiva/sclera: Conjunctivae normal.  Cardiovascular:     Rate and Rhythm: Normal rate.     Pulses: Normal pulses.     Heart sounds: Normal heart sounds.  Pulmonary:     Effort: Pulmonary effort is normal. No respiratory distress.     Breath sounds: Normal breath sounds.  Abdominal:     General: Bowel sounds are normal.     Palpations: Abdomen is soft.     Tenderness: There is abdominal tenderness. There is guarding. There is no right CVA tenderness or left CVA tenderness.  Musculoskeletal:        General: Normal range of motion.     Cervical back: Normal range of motion.  Skin:    General: Skin is warm and dry.     Capillary Refill: Capillary refill takes less than 2 seconds.  Neurological:     General: No focal deficit present.     Mental Status: He is alert.     Coordination: Coordination normal.     Gait: Gait normal.  Psychiatric:        Mood and Affect: Mood normal.       Assessment & Plan:  Need for immunization against  influenza  Bilateral hip pain -     methylPREDNISolone Acetate  Hip pain, bilateral Assessment & Plan: Depo-Medrol injection given today Mobic 15 mg PRN Patient denied physical therapy Explained to patient Non pharmacological interventions include the use of ice or heat, rest, recommend range of motion exercises, gentle stretching.  Follow up for worsening or persistent symptoms. Patient verbalizes understanding regarding plan of care and all questions answered.    Duodenitis Assessment &  Plan: Advise patient to Follow GI recommendation as per last visit Take Nexium 40 mg daily Can use Carafate PRN for burning and discomfort. Advise to follow up wit GI for worsening symptoms  Discussed foods to Avoid [place], Fatty, and Greasy Foods: [place] fries, onion rings, fried chicken, fatty cuts of meat, bacon Full-fat dairy products (whole milk, cheese, cream) Butter, margarine, and oils in large amounts Spicy Foods: Chili, hot sauce, spicy curries, and dishes that contain a lot of peppers Hot mustard or wasabi Alcohol: Beer, wine, and spirits can irritate the esophagus and increase acid production   Encounter for immunization -     Flu Vaccine Trivalent High Dose (Fluad)  Other orders -     Meloxicam; Take 1 tablet (15 mg total) by mouth daily.  Dispense: 30 tablet; Refill: 0    Return if symptoms worsen or fail to improve.   Cruzita Lederer Newman Nip, FNP

## 2023-09-18 NOTE — Assessment & Plan Note (Signed)
Advise patient to Follow GI recommendation as per last visit Take Nexium 40 mg daily Can use Carafate PRN for burning and discomfort. Advise to follow up wit GI for worsening symptoms  Discussed foods to Avoid Matthew Molina, Fatty, and Greasy Foods: [place] fries, onion rings, fried chicken, fatty cuts of meat, bacon Full-fat dairy products (whole milk, cheese, cream) Butter, margarine, and oils in large amounts Spicy Foods: Chili, hot sauce, spicy curries, and dishes that contain a lot of peppers Hot mustard or wasabi Alcohol: Beer, wine, and spirits can irritate the esophagus and increase acid production

## 2023-09-21 ENCOUNTER — Other Ambulatory Visit: Payer: Self-pay | Admitting: Family Medicine

## 2023-09-21 ENCOUNTER — Other Ambulatory Visit: Payer: Self-pay | Admitting: Gastroenterology

## 2023-09-21 DIAGNOSIS — J449 Chronic obstructive pulmonary disease, unspecified: Secondary | ICD-10-CM

## 2023-09-25 ENCOUNTER — Encounter: Payer: Self-pay | Admitting: Gastroenterology

## 2023-09-25 ENCOUNTER — Ambulatory Visit (INDEPENDENT_AMBULATORY_CARE_PROVIDER_SITE_OTHER): Payer: 59 | Admitting: Gastroenterology

## 2023-09-25 VITALS — BP 112/77 | HR 81 | Temp 97.6°F | Ht 73.0 in | Wt 203.6 lb

## 2023-09-25 DIAGNOSIS — R933 Abnormal findings on diagnostic imaging of other parts of digestive tract: Secondary | ICD-10-CM

## 2023-09-25 DIAGNOSIS — Z860101 Personal history of adenomatous and serrated colon polyps: Secondary | ICD-10-CM | POA: Diagnosis not present

## 2023-09-25 DIAGNOSIS — K219 Gastro-esophageal reflux disease without esophagitis: Secondary | ICD-10-CM | POA: Diagnosis not present

## 2023-09-25 DIAGNOSIS — K2950 Unspecified chronic gastritis without bleeding: Secondary | ICD-10-CM

## 2023-09-25 DIAGNOSIS — D122 Benign neoplasm of ascending colon: Secondary | ICD-10-CM

## 2023-09-25 DIAGNOSIS — K2289 Other specified disease of esophagus: Secondary | ICD-10-CM | POA: Diagnosis not present

## 2023-09-25 DIAGNOSIS — R110 Nausea: Secondary | ICD-10-CM

## 2023-09-25 DIAGNOSIS — K5900 Constipation, unspecified: Secondary | ICD-10-CM

## 2023-09-25 DIAGNOSIS — G8929 Other chronic pain: Secondary | ICD-10-CM

## 2023-09-25 DIAGNOSIS — F1091 Alcohol use, unspecified, in remission: Secondary | ICD-10-CM

## 2023-09-25 MED ORDER — SUCRALFATE 1 GM/10ML PO SUSP: 1.0000 g | Freq: Three times a day (TID) | ORAL | 0 refills | Status: DC

## 2023-09-25 MED ORDER — ESOMEPRAZOLE MAGNESIUM 40 MG PO CPDR: 40.0000 mg | DELAYED_RELEASE_CAPSULE | Freq: Two times a day (BID) | ORAL | 3 refills | Status: DC

## 2023-09-25 NOTE — Progress Notes (Unsigned)
GI Office Note    Referring Provider: Kerri Perches, MD Primary Care Physician:  Kerri Perches, MD Primary Gastroenterologist: Hennie Duos. Marletta Lor, DO  Date:  09/25/2023  ID:  Matthew Molina, DOB 06/25/1951, MRN [id number]   Chief Complaint   Chief Complaint  Patient presents with   Follow-up    Follow up from ED. Still having stomach pains.    History of Present Illness  Matthew Molina is a 72 y.o. male with a history of COPD, diabetes, ischemic colitis in 2021, HLD, HTN, depression, and chronic epigastric pain and constipation presenting today with complaint of ongoing upper abdominal pain and nausea.  EGD March 2017: -Esophageal stricture s/p dilation -Gastritis and duodenitis -Biopsies positive for H. Pylori   Initially treated for H. pylori with Prevpac and then subsequently Pylera.  Has had documented eradication.   Colonoscopy April 2021: -7 colon polyps -Pancolonic diverticulosis -External and internal hemorrhoids -Pathology revealed tubular adenomas -Recommend repeat colonoscopy in 3 years   CT chest 03/05/2023: -Moderate focal circumferential wall thickening of lower esophagus, recommended GI referral for endoscopic correlation to exclude esophageal malignancy -Lung RADS 2, benign appearance -Stable mildly dilated ascending thoracic aorta measuring 4.3 cm   Office visit 03/15/2023 by Dr. Marletta Lor. Noted to have found distal esophageal wall thickening on CT chest for lung cancer screening.  Patient denies any dysphagia but does report chronic reflux which is well-controlled on pantoprazole 40 mg once daily.  Denies any family history of gastrointestinal cancer.  Did report taking Mobic daily for joint pains. Advised continue PPI daily.  Colonoscopy offered but patient declined.  Advised to schedule for EGD to further evaluate abnormal esophagus on CT scan.  Advised fatty liver diet for abnormal LFTs.   ED visit 5/5 for abdominal pain.  Brought in by EMS for  epigastric and suprapubic abdominal pain for 2-3 days with nausea and vomiting and decreased p.o. intake.  Reported similar symptoms once before that he stated was due to constipation.  Admitted to drinking at least 1 can of beer per day.  Abdominal exam without any peritoneal signs.  UA was negative for UTI, WBC 8.7, hemoglobin 13.7, creatinine 1.53, lipase 124.  CT with circumferential wall thickening and moderate enhancement of proximal duodenum with adjacent fat stranding with differentials including duodenitis or groove pancreatitis, circumferential wall thickening of distal esophagus which can be seen in the setting of esophagitis.  He was noted to have epigastric tenderness on exam.  He was recommended to follow a liquid diet and was given nausea and pain control to take at home and advised on small nonfatty meals to start in a few days.  He was discussed the importance of scheduling his upper endoscopy and patient reported he needed more time.  Advised to follow-up if symptoms worsened.   Office visit 04/30/23.  Patient states when he eats his stomach bothers him he will sip on Sprite zero and lay down and sometimes that helps.  Primarily is eating soft foods.  Trying to stay away from fried foods and attempts to drink a lot of liquids.  Having nausea but no vomiting.  Taking pantoprazole once daily.  Uses Zofran as needed.  Stopped taking his B12 and now only taking a multivitamin.  Patient reported he stopped drinking just prior to his ED visit in early May.  Taking Dulcolax over-the-counter to help with constipation.  Denied any straining.  Requested to trial Linzess.  Denied any dysphagia.  Given Linzess samples.  Advised  alcohol cessation and continue PPI once daily.  EGD ordered.  Advised low-fat diet.  Patient declined colonoscopy.   EGD 05/28/2023: -Variable Z-line, s/p biopsy - Gastritis s/p biopsy - Small hiatal hernia - Normal duodenum - PPI changes noted in the stomach, negative for H.  pylori.  GE junction biopsy with mild chronic carditis and hyperplastic changes   ED visit for epigastric pain 06/16/2023.  Labs with sodium 133, potassium 3.2, creatinine 1.54.  Normal LFTs.  Lipase elevated at 103.  CT scan revealed persistent circumferential thickening of the distal esophageal wall likely related to reflux.  Hiatal hernia seen again.  No pancreatic ductal dilation or surrounding inflammatory changes seen.  Scattered diverticular change of the colon noted.  Second and third portions of the duodenum again demonstrate circumferential thickening and a somewhat mottled attenuation pattern.  Radiology suspected this could be due to a degree of groove pancreatitis given elevated lipase on day of exam.  No evidence of diverticulitis.  He was given GI cocktail and hypokalemia treated.  He was given Carafate on discharge as well as Zofran.  Last office visit 08/08/23. BM 1-2 times per week with straining.  Intermittently hard stools.  Continue to have upper abdominal pain every day with nausea.  Taking Carafate and still following soft diet, continues to have lack of appetite.  Also having some intermittent bloating and gassiness.  Plan to increase Linzess to 145 mcg daily.  Continue Zofran and Carafate as needed.  Stop pantoprazole and start Nexium 40 mg once daily.  Reinforced alcohol and tobacco cessation.  Follow GERD diet.  ED visit 09/08/2023 with several days of abdominal discomfort with occasional diarrhea and pain radiating to his back.  Denies any vomiting.  No postprandial component.  Mildly tachycardic on exam.  Labs remarkable for potassium of 3.3, creatinine 1.45, glucose 162, GFR 51.  Rare bacteria on UA and presence of ketones and protein.  Treated with IV fluids, Zofran, morphine, PPI. Ct performed.   CT A/P with contrast 09/08/2023: -Moderate wall thickening and adjacent soft tissue stranding involving descending duodenum representing duodenitis were PUD -Stable wall thickening of  distal thoracic esophagus suspicious for chronic esophagitis -Colonic diverticulosis without diverticulitis -Stable markedly enlarged prostate  Today:  Reports pain above the umbilicus  and pain radiating to his back as well. Described as cramping. Sometimes has sharp pain. Tries to get at least breakfast and lunch. Breakfast (bowl or half of cereal). Lunch is boiled egg or oatmeal or chicken salad. At dinner sometimes PB&J or tv dinner.   Blood sugars have been dropping some lately. Has occasional chest pains and not throughout the day.   When he takes all of his medication he is weak and sleepy. Doing fairly well with linzess 145 mcg daily. BM much more frequent.   Cut out alcohol about 3-4 weeks ago.   Wt Readings from Last 3 Encounters:  09/25/23 203 lb 9.6 oz (92.4 kg)  09/18/23 207 lb (93.9 kg)  09/08/23 211 lb 10.3 oz (96 kg)     Current Outpatient Medications  Medication Sig Dispense Refill   ACCU-CHEK GUIDE test strip USE TO CHECK BLOOD SUGAR ONCE DAILY 100 strip 0   Accu-Chek Softclix Lancets lancets TEST BLOOD SUGAR ONCE DAILY AS DIRECTED. 100 each 5   acetaminophen (TYLENOL) 500 MG tablet Take 500-1,000 mg by mouth every 6 (six) hours as needed for headache.     albuterol (VENTOLIN HFA) 108 (90 Base) MCG/ACT inhaler INHALE 2 PUFFS INTO THE LUNGS EVERY  6 HOURS AS NEEDED FOR WHEEZING OR SHORTNESS OF BREATH. 8.5 g 0   aspirin 81 MG EC tablet Take 81 mg by mouth daily. Swallow whole.     Blood Glucose Monitoring Suppl (ACCU-CHEK AVIVA PLUS) w/Device KIT accucheck aviva meter Once daily testing DX e11.9 1 kit 0   busPIRone (BUSPAR) 10 MG tablet Take 1 tablet (10 mg total) by mouth 3 (three) times daily. 90 tablet 2   calcium carbonate (TUMS - DOSED IN MG ELEMENTAL CALCIUM) 500 MG chewable tablet Chew 1 tablet by mouth daily as needed for indigestion or heartburn.     carvedilol (COREG) 6.25 MG tablet Take 6.25 mg by mouth 2 (two) times daily with a meal.     docusate sodium  (COLACE) 100 MG capsule Take 100 mg by mouth daily.     esomeprazole (NEXIUM) 40 MG capsule Take 1 capsule (40 mg total) by mouth daily at 12 noon. 30 capsule 2   FARXIGA 5 MG TABS tablet Take 5 mg by mouth every morning.     fenofibrate (TRICOR) 145 MG tablet Take 1 tablet (145 mg total) by mouth daily. 30 tablet 3   glipiZIDE (GLUCOTROL XL) 10 MG 24 hr tablet TAKE (2) TABLETS BY MOUTH DAILY EVERY MORNING AT BREAKFAST. 60 tablet 2   glucose blood (ACCU-CHEK GUIDE) test strip Use as instructed 50 strip 0   glucose blood (ACCU-CHEK GUIDE) test strip Use to test twice daily. 100 strip 2   ipratropium-albuterol (DUONEB) 0.5-2.5 (3) MG/3ML SOLN INHALE 1 VIAL VIA NEBULIZER EVERY 4 TO 6 HOURS. 540 mL 3   Lancets 30G MISC Once daily testing dx e11.9 100 each 5   linaclotide (LINZESS) 145 MCG CAPS capsule Take 1 capsule (145 mcg total) by mouth daily before breakfast. 30 capsule 3   meloxicam (MOBIC) 15 MG tablet Take 1 tablet (15 mg total) by mouth daily. 30 tablet 0   montelukast (SINGULAIR) 10 MG tablet TAKE (1) TABLET BY MOUTH AT BEDTIME. 30 tablet 0   Multiple Vitamins-Minerals (MULTIVITAMIN WITH MINERALS) tablet Take 1 tablet by mouth daily.     olmesartan (BENICAR) 40 MG tablet TAKE ONE TABLET BY MOUTH DAILY 90 tablet 0   ondansetron (ZOFRAN-ODT) 4 MG disintegrating tablet DISSOLVE 1 TABLET ON TONGUE ONCE DAILY AS NEEDED. 20 tablet 0   sucralfate (CARAFATE) 1 GM/10ML suspension TAKE 2 TEASPOONFULS ( ) BY MOUTH 3 TIMES A DAILY AS NEEDED. 420 mL 0   SYMBICORT 160-4.5 MCG/ACT inhaler INHALE 2 PUFFS INTO THE LUNGS ONCE IN THE MORNING AND AT BEDTIME. 10.2 g 0   tamsulosin (FLOMAX) 0.4 MG CAPS capsule Take 1 capsule (0.4 mg total) by mouth daily after supper. 90 capsule 3   traZODone (DESYREL) 50 MG tablet TAKE ONE TABLET BY MOUTH AT BEDTIME AS NEEDED FOR SLEEP. 90 tablet 0   triamterene-hydrochlorothiazide (MAXZIDE-25) 37.5-25 MG tablet TAKE (1/2) TABLET BY MOUTH ONCE DAILY. 45 tablet 0   nicotine  (NICODERM CQ - DOSED IN MG/24 HOURS) 14 mg/24hr patch Place 1 patch (14 mg total) onto the skin daily. (Patient not taking: Reported on 09/25/2023) 28 patch 1   nicotine (NICODERM CQ) 21 mg/24hr patch Place 1 patch (21 mg total) onto the skin daily. (Patient not taking: Reported on 09/25/2023) 28 patch 3   Polyethyl Glycol-Propyl Glycol 0.4-0.3 % SOLN Place 1 Application into both eyes as needed (dry eyes). (Patient not taking: Reported on 09/25/2023)     risperiDONE (RISPERDAL) 1 MG tablet TAKE ONE TABLET BY MOUTH IN THE EVENING.  TAKE WITH 4MG  TABLET. 90 tablet 1   risperidone (RISPERDAL) 4 MG tablet TAKE ONE TABLET BY MOUTH IN THE EVENING. TAKE WITH 1MG  TABLET. 90 tablet 0   rosuvastatin (CRESTOR) 10 MG tablet TAKE ONE TABLET BY MOUTH ONCE DAILY. 90 tablet 3   No current facility-administered medications for this visit.    Past Medical History:  Diagnosis Date   Acute blood loss anemia 01/05/2020   Acute respiratory failure with hypoxia (HCC)    Arthritis    COPD (chronic obstructive pulmonary disease) (HCC)    Depression    Diabetes mellitus    Diabetes mellitus without complication (HCC)    Diverticulitis    Elevated PSA 10/01/2016   Encounter for support and coordination of transition of care 01/14/2020   Hypercholesterolemia    Hyperlipidemia    Hypertension    Insomnia 01/05/2016   Ischemic colitis (HCC) 01/05/2020   Multiple lung nodules on CT 04/02/2015   Nicotine addiction    Obesity    Oxygen deficiency    qhs   Schizophrenia [facility])     Past Surgical History:  Procedure Laterality Date   BIOPSY  05/28/2023   Procedure: BIOPSY;  Surgeon: Lanelle Bal, DO;  [facility];  Service: Endoscopy;;   CATARACT EXTRACTION W/PHACO Left 11/20/2013   Procedure: CATARACT EXTRACTION PHACO AND INTRAOCULAR LENS PLACEMENT (IOC);  Surgeon: Gemma Payor, MD;  Location: [facility];  Service: Ophthalmology;  Laterality: Left;  CDE:10.26   CATARACT EXTRACTION W/PHACO Right  12/08/2013   Procedure: RIGHT EYE CATARACT EXTRACTION PHACO AND INTRAOCULAR LENS PLACEMENT ;  Surgeon: Gemma Payor, MD;  Location: [facility];  Service: Ophthalmology;  Laterality: Right;  CDE 12.38   COLONOSCOPY N/A 04/01/2013   pancolonic diverticulosis, redundant colon, large internal hemorrhoids.    COLONOSCOPY  2014   INCOMPLETE PREP IN R COLON   COLONOSCOPY N/A 04/01/2020   Procedure: COLONOSCOPY;  Surgeon: West Bali, MD;  [facility];  Service: Endoscopy;  Laterality: N/A;  8:45am   ESOPHAGOGASTRODUODENOSCOPY N/A 02/18/2016   stricture at GE junction, moderate erosive gastritis and mild non-erosive duodenitis. +H.pylori. Treated initially with Prevpac. Breath test to check for eradication was positive, and he was prescribed Pylera.    ESOPHAGOGASTRODUODENOSCOPY (EGD) WITH PROPOFOL N/A 05/28/2023   Procedure: ESOPHAGOGASTRODUODENOSCOPY (EGD) WITH PROPOFOL;  Surgeon: Lanelle Bal, DO;  [facility];  Service: Endoscopy;  Laterality: N/A;  11:15am, asa 3   EYE SURGERY Left 11/2013   cataract extraction   POLYPECTOMY  04/01/2020   Procedure: POLYPECTOMY;  Surgeon: West Bali, MD;  [facility];  Service: Endoscopy;;   SAVORY DILATION N/A 02/18/2016   Procedure: SAVORY DILATION;  Surgeon: West Bali, MD;  [facility];  Service: Endoscopy;  Laterality: N/A;   SHOULDER SURGERY Right     Family History  Problem Relation Age of Onset   Diabetes Mother    Hypertension Mother    Stroke Mother    Diabetes Sister    Heart disease Sister    Kidney disease Father    Drug abuse Brother    Colon cancer Neg Hx    Colon polyps Neg Hx     Allergies as of 09/25/2023 - Review Complete 09/18/2023  Allergen Reaction Noted   Sertraline hcl  04/17/2011   Wellbutrin [bupropion] Other (See Comments) 07/29/2015    Social History   Socioeconomic History   Marital status: Divorced    Spouse name: Not on file  Number of children: 2    Years of education: Not on file   Highest education level: Not on file  Occupational History   Occupation: retired from [employer]   Tobacco Use   Smoking status: Every Day    Current packs/day: 1.00    Average packs/day: 1 pack/day for 48.0 years (48.0 ttl pk-yrs)    Types: Cigarettes   Smokeless tobacco: Never  Vaping Use   Vaping status: Never Used  Substance and Sexual Activity   Alcohol use: Yes    Alcohol/week: 0.0 standard drinks of alcohol    Comment: "little bit of beer"   Drug use: No   Sexual activity: Not Currently    Birth control/protection: None  Other Topics Concern   Not on file  Social History Narrative   ** Merged History Encounter **       ** Merged History Encounter **       Social Determinants of Health   Financial Resource Strain: Low Risk  (12/13/2022)   Overall Financial Resource Strain (CARDIA)    Difficulty of Paying Living Expenses: Not very hard  Food Insecurity: No Food Insecurity (11/27/2022)   Hunger Vital Sign    Worried About Running Out of Food in the Last Year: Never true    Ran Out of Food in the Last Year: Never true  Transportation Needs: No Transportation Needs (12/13/2022)   PRAPARE - Administrator, Civil Service (Medical): No    Lack of Transportation (Non-Medical): No  Physical Activity: Inactive (11/27/2022)   Exercise Vital Sign    Days of Exercise per Week: 0 days    Minutes of Exercise per Session: 0 min  Stress: No Stress Concern Present (11/27/2022)   [employer] of Occupational Health - Occupational Stress Questionnaire    Feeling of Stress : Not at all  Social Connections: Moderately Isolated (11/27/2022)   Social Connection and Isolation Panel [NHANES]    Frequency of Communication with Friends and Family: More than three times a week    Frequency of Social Gatherings with Friends and Family: More than three times a week    Attends Religious Services: More than 4 times per year    Active Member of  [employer] or Organizations: No    Attends Banker Meetings: Never    Marital Status: Divorced     Review of Systems   Gen: Denies fever, chills, anorexia. Denies fatigue, weakness, weight loss.  CV: Denies chest pain, palpitations, syncope, peripheral edema, and claudication. Resp: Denies dyspnea at rest, cough, wheezing, coughing up blood, and pleurisy. GI: See HPI Derm: Denies rash, itching, dry skin Psych: Denies depression, anxiety, memory loss, confusion. No homicidal or suicidal ideation.  Heme: Denies bruising, bleeding, and enlarged lymph nodes.   Physical Exam   BP 112/77 (BP Location: Right Arm, Patient Position: Sitting, Cuff Size: Normal)   Pulse 81   Temp 97.6 F (36.4 C) (Temporal)   Ht 6\' 1"  (1.854 m)   Wt 203 lb 9.6 oz (92.4 kg)   SpO2 96%   BMI 26.86 kg/m   General:   Alert and oriented. Pleasant and cooperative.  NAD Head:  Normocephalic and atraumatic. Eyes:  Conjuctiva clear without scleral icterus. Mouth:  Oral mucosa pink and moist. Good dentition. No lesions.  Quiet speech Lungs:  Clear to auscultation bilaterally. No wheezes, rales, or rhonchi. No distress.  Heart:  S1, S2 present without murmurs appreciated.  Abdomen:  +BS, soft, non-distended.  TTP to epigastrium and  LUQ.  No rebound or guarding. No HSM or masses noted. Rectal: deferred Msk: Unsteady gait. Extremities:  Without edema. Neurologic:  Alert and  oriented x4 Psych:  Alert and cooperative. Normal mood and affect.   Assessment  Matthew Molina is a 72 y.o. male with a history of COPD, diabetes, ischemic colitis in 2021, HLD, HTN, depression, and chronic epigastric pain and constipation presenting today with complaint of ongoing upper abdominal pain and nausea.   Esophageal wall thickening on CT: CT scan in June revealed chronic esophageal wall thickening.  EGD in June with variable Z-line, and gastritis with biopsy confirming chronic gastritis and hyperplastic changes.  No  concerning malignancy present.  GERD, nausea, epigastric pain: Previously on pantoprazole 40 mg once daily and switch to Nexium at last office visit to take once daily.  Reportedly avoiding fried/fatty foods at that time and following a softer diet given he had episode of pancreatitis in June.  Recent ED visit again with epigastric pain with some mild radiating pain to the back.  CT performed with moderate wall thickening and adjacent soft tissue stranding involving the descending duodenum representing duodenitis versus peptic ulcer disease.  Chronic distal thoracic esophageal thickening noted.  Given his ongoing symptoms I will refill Carafate for some symptomatic relief and we will increase PPI to twice daily.  Recently quit alcohol use within the last 3-4 weeks per patient.  Hopefully with twice daily PPI for a few months along with avoiding injury with alcohol he will begin to have improvement.  Advise he continue to use Zofran as needed.  Adenomatous colon polyps: Last colonoscopy in April 2021.  History of tubular adenomas.  Due for repeat this year however patient has continued to decline repeat colonoscopy for surveillance purposes.  Constipation: Doing fairly well with Linzess 145 mcg daily.  Will continue this current regimen.   PLAN   Increase nexium to 40 mg BID Carafate as needed Zofran as needed Alcohol and tobacco cessation Linzess 145 mcg Follow up in 2 months.    Brooke Bonito, MSN, FNP-BC, AGACNP-BC [facility]

## 2023-09-25 NOTE — Patient Instructions (Addendum)
For the weakness they have been experiencing I would recommend that an hour after you take your morning medications for you to check your blood sugar as well as your blood pressure at home.  If you are having lower blood sugars, high blood sugars, or a drop in your blood pressure then please let your primary care provider know so you can adjust medications appropriately.  I have sent in a refill of your Nexium for you.  I want you to start taking this twice a day, 30 minutes prior to breakfast and 30 minutes prior to dinner.  I have also refilled your Carafate for you.  You can take this up to 4 times a day as needed for abdominal discomfort.  Continue to avoid alcohol and tobacco.  Follow a GERD diet:  Avoid fried, fatty, greasy, spicy, citrus foods. Avoid caffeine and carbonated beverages. Avoid chocolate. Try eating 4-6 small meals a day rather than 3 large meals. Do not eat within 3 hours of laying down. Prop head of bed up on wood or bricks to create a 6 inch incline.  Continue taking your Linzess 145 mcg once daily.  I will see you in 2 months for follow-up.  It was a pleasure to see you today. I want to create trusting relationships with patients. If you receive a survey regarding your visit,  I greatly appreciate you taking time to fill this out on paper or through your MyChart. I value your feedback.  Brooke Bonito, MSN, FNP-BC, AGACNP-BC [facility]

## 2023-09-26 ENCOUNTER — Ambulatory Visit: Payer: 59 | Admitting: Podiatry

## 2023-09-26 ENCOUNTER — Telehealth: Payer: Self-pay | Admitting: Family Medicine

## 2023-09-26 ENCOUNTER — Other Ambulatory Visit: Payer: Self-pay

## 2023-09-26 MED ORDER — RISPERIDONE 1 MG PO TABS: 1.0000 mg | ORAL_TABLET | Freq: Every day | ORAL | 1 refills | Status: DC

## 2023-09-26 NOTE — Telephone Encounter (Signed)
Refill sent.

## 2023-09-26 NOTE — Telephone Encounter (Signed)
Prescription Request  09/26/2023  LOV: 08/02/2023  What is the name of the medication or equipment? risperiDONE (RISPERDAL) 1 MG tablet [[id number]]    Have you contacted your pharmacy to request a refill? No   Which pharmacy would you like this sent to?  [facility] - [place], [place] - [address] [place] [address] Phone: [phone] Fax: [phone]    Patient notified that their request is being sent to the clinical staff for review and that they should receive a response within 2 business days.   Please advise at Mobile [phone] (mobile)

## 2023-09-27 DIAGNOSIS — D472 Monoclonal gammopathy: Secondary | ICD-10-CM | POA: Diagnosis not present

## 2023-09-27 DIAGNOSIS — N1832 Chronic kidney disease, stage 3b: Secondary | ICD-10-CM | POA: Diagnosis not present

## 2023-09-27 DIAGNOSIS — R809 Proteinuria, unspecified: Secondary | ICD-10-CM | POA: Diagnosis not present

## 2023-09-27 DIAGNOSIS — E1122 Type 2 diabetes mellitus with diabetic chronic kidney disease: Secondary | ICD-10-CM | POA: Diagnosis not present

## 2023-09-27 DIAGNOSIS — N189 Chronic kidney disease, unspecified: Secondary | ICD-10-CM | POA: Diagnosis not present

## 2023-09-30 ENCOUNTER — Other Ambulatory Visit: Payer: Self-pay

## 2023-09-30 ENCOUNTER — Emergency Department (HOSPITAL_COMMUNITY): Payer: 59

## 2023-09-30 ENCOUNTER — Inpatient Hospital Stay (HOSPITAL_COMMUNITY)
Admission: EM | Admit: 2023-09-30 | Discharge: 2023-10-03 | DRG: 871 | Disposition: A | Discharge status: Home / Self Care | Payer: 59 | Attending: Internal Medicine | Admitting: Internal Medicine

## 2023-09-30 ENCOUNTER — Encounter (HOSPITAL_COMMUNITY): Payer: Self-pay | Admitting: *Deleted

## 2023-09-30 DIAGNOSIS — I129 Hypertensive chronic kidney disease with stage 1 through stage 4 chronic kidney disease, or unspecified chronic kidney disease: Secondary | ICD-10-CM | POA: Diagnosis present

## 2023-09-30 DIAGNOSIS — K254 Chronic or unspecified gastric ulcer with hemorrhage: Secondary | ICD-10-CM | POA: Diagnosis present

## 2023-09-30 DIAGNOSIS — F209 Schizophrenia, unspecified: Secondary | ICD-10-CM | POA: Diagnosis present

## 2023-09-30 DIAGNOSIS — R1013 Epigastric pain: Secondary | ICD-10-CM | POA: Diagnosis not present

## 2023-09-30 DIAGNOSIS — I10 Essential (primary) hypertension: Secondary | ICD-10-CM | POA: Diagnosis not present

## 2023-09-30 DIAGNOSIS — N179 Acute kidney failure, unspecified: Secondary | ICD-10-CM | POA: Diagnosis present

## 2023-09-30 DIAGNOSIS — N400 Enlarged prostate without lower urinary tract symptoms: Secondary | ICD-10-CM | POA: Diagnosis present

## 2023-09-30 DIAGNOSIS — E785 Hyperlipidemia, unspecified: Secondary | ICD-10-CM

## 2023-09-30 DIAGNOSIS — A419 Sepsis, unspecified organism: Secondary | ICD-10-CM | POA: Diagnosis present

## 2023-09-30 DIAGNOSIS — K279 Peptic ulcer, site unspecified, unspecified as acute or chronic, without hemorrhage or perforation: Secondary | ICD-10-CM | POA: Diagnosis present

## 2023-09-30 DIAGNOSIS — K222 Esophageal obstruction: Secondary | ICD-10-CM | POA: Diagnosis present

## 2023-09-30 DIAGNOSIS — J42 Unspecified chronic bronchitis: Secondary | ICD-10-CM | POA: Diagnosis not present

## 2023-09-30 DIAGNOSIS — Z743 Need for continuous supervision: Secondary | ICD-10-CM | POA: Diagnosis not present

## 2023-09-30 DIAGNOSIS — Z794 Long term (current) use of insulin: Secondary | ICD-10-CM | POA: Diagnosis not present

## 2023-09-30 DIAGNOSIS — K2980 Duodenitis without bleeding: Secondary | ICD-10-CM | POA: Diagnosis not present

## 2023-09-30 DIAGNOSIS — Z841 Family history of disorders of kidney and ureter: Secondary | ICD-10-CM

## 2023-09-30 DIAGNOSIS — N1832 Chronic kidney disease, stage 3b: Secondary | ICD-10-CM | POA: Diagnosis present

## 2023-09-30 DIAGNOSIS — Z7984 Long term (current) use of oral hypoglycemic drugs: Secondary | ICD-10-CM

## 2023-09-30 DIAGNOSIS — K259 Gastric ulcer, unspecified as acute or chronic, without hemorrhage or perforation: Secondary | ICD-10-CM | POA: Diagnosis not present

## 2023-09-30 DIAGNOSIS — Z8249 Family history of ischemic heart disease and other diseases of the circulatory system: Secondary | ICD-10-CM

## 2023-09-30 DIAGNOSIS — R112 Nausea with vomiting, unspecified: Secondary | ICD-10-CM | POA: Diagnosis not present

## 2023-09-30 DIAGNOSIS — I2510 Atherosclerotic heart disease of native coronary artery without angina pectoris: Secondary | ICD-10-CM | POA: Diagnosis present

## 2023-09-30 DIAGNOSIS — J449 Chronic obstructive pulmonary disease, unspecified: Secondary | ICD-10-CM | POA: Diagnosis not present

## 2023-09-30 DIAGNOSIS — E860 Dehydration: Secondary | ICD-10-CM | POA: Diagnosis present

## 2023-09-30 DIAGNOSIS — R6520 Severe sepsis without septic shock: Secondary | ICD-10-CM | POA: Diagnosis present

## 2023-09-30 DIAGNOSIS — R5383 Other fatigue: Secondary | ICD-10-CM | POA: Diagnosis not present

## 2023-09-30 DIAGNOSIS — E1169 Type 2 diabetes mellitus with other specified complication: Secondary | ICD-10-CM | POA: Diagnosis present

## 2023-09-30 DIAGNOSIS — R739 Hyperglycemia, unspecified: Secondary | ICD-10-CM | POA: Diagnosis not present

## 2023-09-30 DIAGNOSIS — I2489 Other forms of acute ischemic heart disease: Secondary | ICD-10-CM | POA: Diagnosis present

## 2023-09-30 DIAGNOSIS — Z7982 Long term (current) use of aspirin: Secondary | ICD-10-CM

## 2023-09-30 DIAGNOSIS — Z9841 Cataract extraction status, right eye: Secondary | ICD-10-CM

## 2023-09-30 DIAGNOSIS — Q403 Congenital malformation of stomach, unspecified: Secondary | ICD-10-CM | POA: Diagnosis not present

## 2023-09-30 DIAGNOSIS — K219 Gastro-esophageal reflux disease without esophagitis: Secondary | ICD-10-CM | POA: Diagnosis not present

## 2023-09-30 DIAGNOSIS — D649 Anemia, unspecified: Secondary | ICD-10-CM | POA: Diagnosis not present

## 2023-09-30 DIAGNOSIS — K2211 Ulcer of esophagus with bleeding: Secondary | ICD-10-CM | POA: Diagnosis present

## 2023-09-30 DIAGNOSIS — E871 Hypo-osmolality and hyponatremia: Secondary | ICD-10-CM | POA: Diagnosis present

## 2023-09-30 DIAGNOSIS — E7800 Pure hypercholesterolemia, unspecified: Secondary | ICD-10-CM | POA: Diagnosis present

## 2023-09-30 DIAGNOSIS — J440 Chronic obstructive pulmonary disease with acute lower respiratory infection: Secondary | ICD-10-CM | POA: Diagnosis present

## 2023-09-30 DIAGNOSIS — R933 Abnormal findings on diagnostic imaging of other parts of digestive tract: Secondary | ICD-10-CM | POA: Diagnosis not present

## 2023-09-30 DIAGNOSIS — I7121 Aneurysm of the ascending aorta, without rupture: Secondary | ICD-10-CM | POA: Diagnosis not present

## 2023-09-30 DIAGNOSIS — E1165 Type 2 diabetes mellitus with hyperglycemia: Secondary | ICD-10-CM | POA: Diagnosis present

## 2023-09-30 DIAGNOSIS — B9681 Helicobacter pylori [H. pylori] as the cause of diseases classified elsewhere: Secondary | ICD-10-CM | POA: Diagnosis present

## 2023-09-30 DIAGNOSIS — F413 Other mixed anxiety disorders: Secondary | ICD-10-CM | POA: Diagnosis not present

## 2023-09-30 DIAGNOSIS — E1122 Type 2 diabetes mellitus with diabetic chronic kidney disease: Secondary | ICD-10-CM | POA: Diagnosis present

## 2023-09-30 DIAGNOSIS — F17210 Nicotine dependence, cigarettes, uncomplicated: Secondary | ICD-10-CM | POA: Diagnosis present

## 2023-09-30 DIAGNOSIS — G8929 Other chronic pain: Secondary | ICD-10-CM | POA: Diagnosis present

## 2023-09-30 DIAGNOSIS — G9341 Metabolic encephalopathy: Secondary | ICD-10-CM | POA: Diagnosis present

## 2023-09-30 DIAGNOSIS — E861 Hypovolemia: Secondary | ICD-10-CM | POA: Diagnosis present

## 2023-09-30 DIAGNOSIS — Z9842 Cataract extraction status, left eye: Secondary | ICD-10-CM

## 2023-09-30 DIAGNOSIS — D509 Iron deficiency anemia, unspecified: Secondary | ICD-10-CM | POA: Diagnosis present

## 2023-09-30 DIAGNOSIS — Z888 Allergy status to other drugs, medicaments and biological substances status: Secondary | ICD-10-CM

## 2023-09-30 DIAGNOSIS — R531 Weakness: Secondary | ICD-10-CM | POA: Diagnosis not present

## 2023-09-30 DIAGNOSIS — K209 Esophagitis, unspecified without bleeding: Secondary | ICD-10-CM | POA: Diagnosis not present

## 2023-09-30 DIAGNOSIS — J690 Pneumonitis due to inhalation of food and vomit: Secondary | ICD-10-CM | POA: Diagnosis present

## 2023-09-30 DIAGNOSIS — Z79899 Other long term (current) drug therapy: Secondary | ICD-10-CM

## 2023-09-30 DIAGNOSIS — N281 Cyst of kidney, acquired: Secondary | ICD-10-CM | POA: Diagnosis not present

## 2023-09-30 DIAGNOSIS — Z833 Family history of diabetes mellitus: Secondary | ICD-10-CM

## 2023-09-30 DIAGNOSIS — J189 Pneumonia, unspecified organism: Secondary | ICD-10-CM

## 2023-09-30 DIAGNOSIS — K2970 Gastritis, unspecified, without bleeding: Secondary | ICD-10-CM | POA: Diagnosis not present

## 2023-09-30 DIAGNOSIS — Z8719 Personal history of other diseases of the digestive system: Secondary | ICD-10-CM

## 2023-09-30 DIAGNOSIS — J439 Emphysema, unspecified: Secondary | ICD-10-CM | POA: Diagnosis present

## 2023-09-30 DIAGNOSIS — Z791 Long term (current) use of non-steroidal anti-inflammatories (NSAID): Secondary | ICD-10-CM

## 2023-09-30 DIAGNOSIS — R6889 Other general symptoms and signs: Secondary | ICD-10-CM | POA: Diagnosis not present

## 2023-09-30 DIAGNOSIS — Z961 Presence of intraocular lens: Secondary | ICD-10-CM | POA: Diagnosis present

## 2023-09-30 DIAGNOSIS — Z7951 Long term (current) use of inhaled steroids: Secondary | ICD-10-CM

## 2023-09-30 LAB — CBC WITH DIFFERENTIAL/PLATELET
Abs Immature Granulocytes: 0 10*3/uL (ref 0.00–0.07)
Basophils Absolute: 0 10*3/uL (ref 0.0–0.1)
Basophils Relative: 0 %
Eosinophils Absolute: 0 10*3/uL (ref 0.0–0.5)
Eosinophils Relative: 0 %
HCT: 31.4 % — ABNORMAL LOW (ref 39.0–52.0)
Hemoglobin: 10.1 g/dL — ABNORMAL LOW (ref 13.0–17.0)
Lymphocytes Relative: 6 %
Lymphs Abs: 1.2 10*3/uL (ref 0.7–4.0)
MCH: 28.8 pg (ref 26.0–34.0)
MCHC: 32.2 g/dL (ref 30.0–36.0)
MCV: 89.5 fL (ref 80.0–100.0)
Monocytes Absolute: 0.6 10*3/uL (ref 0.1–1.0)
Monocytes Relative: 3 %
Neutro Abs: 17.9 10*3/uL — ABNORMAL HIGH (ref 1.7–7.7)
Neutrophils Relative %: 91 %
Platelets: 230 10*3/uL (ref 150–400)
RBC: 3.51 MIL/uL — ABNORMAL LOW (ref 4.22–5.81)
RDW: 14.8 % (ref 11.5–15.5)
WBC: 19.7 10*3/uL — ABNORMAL HIGH (ref 4.0–10.5)
nRBC: 0 % (ref 0.0–0.2)

## 2023-09-30 LAB — COMPREHENSIVE METABOLIC PANEL
ALT: 11 U/L (ref 0–44)
AST: 29 U/L (ref 15–41)
Albumin: 3.2 g/dL — ABNORMAL LOW (ref 3.5–5.0)
Alkaline Phosphatase: 24 U/L — ABNORMAL LOW (ref 38–126)
Anion gap: 11 (ref 5–15)
BUN: 54 mg/dL — ABNORMAL HIGH (ref 8–23)
CO2: 22 mmol/L (ref 22–32)
Calcium: 9.3 mg/dL (ref 8.9–10.3)
Chloride: 93 mmol/L — ABNORMAL LOW (ref 98–111)
Creatinine, Ser: 2.97 mg/dL — ABNORMAL HIGH (ref 0.61–1.24)
GFR, Estimated: 22 mL/min — ABNORMAL LOW (ref >60)
Glucose, Bld: 239 mg/dL — ABNORMAL HIGH (ref 70–99)
Potassium: 3.9 mmol/L (ref 3.5–5.1)
Sodium: 126 mmol/L — ABNORMAL LOW (ref 135–145)
Total Bilirubin: 0.9 mg/dL (ref 0.3–1.2)
Total Protein: 6 g/dL — ABNORMAL LOW (ref 6.5–8.1)

## 2023-09-30 LAB — ETHANOL: Alcohol, Ethyl (B): <10 mg/dL (ref <10)

## 2023-09-30 LAB — TROPONIN I (HIGH SENSITIVITY)
Troponin I (High Sensitivity): 68 ng/L — ABNORMAL HIGH (ref <18)
Troponin I (High Sensitivity): 66 ng/L — ABNORMAL HIGH (ref <18)

## 2023-09-30 LAB — CBG MONITORING, ED: Glucose-Capillary: 211 mg/dL — ABNORMAL HIGH (ref 70–99)

## 2023-09-30 LAB — AMMONIA: Ammonia: 14 umol/L (ref 9–35)

## 2023-09-30 LAB — NA AND K (SODIUM & POTASSIUM), RAND UR
Potassium Urine: 62 mmol/L
Sodium, Ur: 12 mmol/L

## 2023-09-30 LAB — LACTIC ACID, PLASMA
Lactic Acid, Venous: 2.3 mmol/L (ref 0.5–1.9)
Lactic Acid, Venous: 2.2 mmol/L (ref 0.5–1.9)

## 2023-09-30 MED ORDER — ACETAMINOPHEN 500 MG PO TABS: 500.0000 mg | ORAL_TABLET | Freq: Four times a day (QID) | ORAL | Status: DC | PRN

## 2023-09-30 MED ORDER — ADULT MULTIVITAMIN W/MINERALS CH
1.0000 | ORAL_TABLET | Freq: Every day | ORAL | Status: DC
  Administered 2023-09-30 – 2023-10-03 (×4): 1 via ORAL
  Filled 2023-09-30 (×4): qty 1

## 2023-09-30 MED ORDER — RISPERIDONE 1 MG PO TABS: 1.0000 mg | ORAL_TABLET | Freq: Every day | ORAL | Status: DC

## 2023-09-30 MED ORDER — SUCRALFATE 1 GM/10ML PO SUSP
1.0000 g | Freq: Three times a day (TID) | ORAL | Status: DC
  Administered 2023-09-30 – 2023-10-03 (×11): 1 g via ORAL
  Filled 2023-09-30 (×12): qty 10

## 2023-09-30 MED ORDER — ACETAMINOPHEN 325 MG PO TABS
650.0000 mg | ORAL_TABLET | Freq: Four times a day (QID) | ORAL | Status: DC | PRN
  Administered 2023-10-02: 650 mg via ORAL
  Filled 2023-09-30: qty 2

## 2023-09-30 MED ORDER — ROSUVASTATIN CALCIUM 10 MG PO TABS
10.0000 mg | ORAL_TABLET | Freq: Every day | ORAL | Status: DC
  Administered 2023-09-30 – 2023-10-03 (×4): 10 mg via ORAL
  Filled 2023-09-30 (×4): qty 1

## 2023-09-30 MED ORDER — LACTATED RINGERS IV BOLUS
1000.0000 mL | Freq: Once | INTRAVENOUS | Status: AC
  Administered 2023-09-30: 1000 mL via INTRAVENOUS

## 2023-09-30 MED ORDER — GUAIFENESIN-DM 100-10 MG/5ML PO SYRP
5.0000 mL | ORAL_SOLUTION | ORAL | Status: DC | PRN
  Administered 2023-10-01 (×2): 5 mL via ORAL
  Filled 2023-09-30 (×2): qty 5

## 2023-09-30 MED ORDER — PANTOPRAZOLE SODIUM 40 MG IV SOLR
40.0000 mg | Freq: Once | INTRAVENOUS | Status: AC
  Administered 2023-09-30: 40 mg via INTRAVENOUS
  Filled 2023-09-30: qty 10

## 2023-09-30 MED ORDER — TRAZODONE HCL 50 MG PO TABS: 50.0000 mg | ORAL_TABLET | Freq: Every evening | ORAL | Status: DC | PRN

## 2023-09-30 MED ORDER — ACETAMINOPHEN 650 MG RE SUPP: 650.0000 mg | Freq: Four times a day (QID) | RECTAL | Status: DC | PRN

## 2023-09-30 MED ORDER — SODIUM CHLORIDE 0.9 % IV SOLN: 1.5000 g | Freq: Four times a day (QID) | INTRAVENOUS | Status: DC

## 2023-09-30 MED ORDER — ONDANSETRON HCL 4 MG/2ML IJ SOLN: 4.0000 mg | Freq: Four times a day (QID) | INTRAMUSCULAR | Status: DC | PRN

## 2023-09-30 MED ORDER — SODIUM CHLORIDE 0.9 % IV SOLN
500.0000 mg | INTRAVENOUS | Status: DC
  Administered 2023-09-30: 500 mg via INTRAVENOUS
  Filled 2023-09-30 (×2): qty 5

## 2023-09-30 MED ORDER — SODIUM CHLORIDE 0.9 % IV SOLN: INTRAVENOUS | Status: AC

## 2023-09-30 MED ORDER — RISPERIDONE 1 MG PO TABS
5.0000 mg | ORAL_TABLET | Freq: Every day | ORAL | Status: DC
  Administered 2023-09-30 – 2023-10-02 (×3): 5 mg via ORAL
  Filled 2023-09-30 (×3): qty 5

## 2023-09-30 MED ORDER — SODIUM CHLORIDE 0.9 % IV SOLN
2.0000 g | Freq: Once | INTRAVENOUS | Status: AC
  Administered 2023-09-30: 2 g via INTRAVENOUS
  Filled 2023-09-30: qty 20

## 2023-09-30 MED ORDER — SODIUM CHLORIDE 0.9 % IV SOLN
3.0000 g | Freq: Two times a day (BID) | INTRAVENOUS | Status: DC
  Administered 2023-09-30 – 2023-10-01 (×2): 3 g via INTRAVENOUS
  Filled 2023-09-30 (×2): qty 8

## 2023-09-30 MED ORDER — FENOFIBRATE 54 MG PO TABS
54.0000 mg | ORAL_TABLET | Freq: Every day | ORAL | Status: DC
  Administered 2023-10-01 – 2023-10-03 (×3): 54 mg via ORAL
  Filled 2023-09-30 (×7): qty 1

## 2023-09-30 MED ORDER — BUSPIRONE HCL 5 MG PO TABS
10.0000 mg | ORAL_TABLET | Freq: Three times a day (TID) | ORAL | Status: DC
  Administered 2023-09-30 – 2023-10-03 (×9): 10 mg via ORAL
  Filled 2023-09-30 (×9): qty 2

## 2023-09-30 MED ORDER — PANTOPRAZOLE SODIUM 40 MG IV SOLR
40.0000 mg | Freq: Two times a day (BID) | INTRAVENOUS | Status: DC
  Administered 2023-09-30 – 2023-10-03 (×6): 40 mg via INTRAVENOUS
  Filled 2023-09-30 (×6): qty 10

## 2023-09-30 MED ORDER — TAMSULOSIN HCL 0.4 MG PO CAPS
0.4000 mg | ORAL_CAPSULE | Freq: Every day | ORAL | Status: DC
  Administered 2023-09-30 – 2023-10-02 (×3): 0.4 mg via ORAL
  Filled 2023-09-30 (×3): qty 1

## 2023-09-30 MED ORDER — NALOXONE HCL 0.4 MG/ML IJ SOLN
0.4000 mg | Freq: Once | INTRAMUSCULAR | Status: AC
  Administered 2023-09-30: 0.4 mg via INTRAVENOUS
  Filled 2023-09-30: qty 1

## 2023-09-30 MED ORDER — ONDANSETRON HCL 4 MG PO TABS: 4.0000 mg | ORAL_TABLET | Freq: Four times a day (QID) | ORAL | Status: DC | PRN

## 2023-09-30 NOTE — Progress Notes (Signed)
Patient has refused all medicines including iv medication. Patient is swatting at the nurses when trying to assist patient. Patient was moved from 333 to room 324 due ot getting out of the bed and high fall risk. MD Arrien notified.

## 2023-09-30 NOTE — H&P (Addendum)
History and Physical    Patient: Matthew Molina QMV:[id number] DOB: 12-Mar-1951 DOA: 09/30/2023 DOS: the patient was seen and examined on 09/30/2023 PCP: Kerri Perches, MD  Patient coming from: Home  Chief Complaint:  Chief Complaint  Patient presents with   Hypotension   HPI: YUEPHENG Molina is a 72 y.o. male with medical history significant of peptic ulcer disease, COPD, T2DM, hypertension, dyslipidemia, ischemic colitis, and schizophrenia who presented with altered mental status.   Patient reports having nausea and vomiting for the last 2 to 3 days, associated with poor oral intake, and generalized weakness.  He has been vomiting stomach content, and he denies hematemesis. Positive abdominal pain generalized in location, constant and dull in nature, moderate in intensity with no improving or worsening factors. No diarrhea or constipation. His last bowel movement was 3 days ago.   10/08 he was evaluated at the gastroenterology outpatient office, for upper abdominal pain and nausea. Looks like has chronic abdominal pain. He was worked up with CT of the abdomen which showed moderate wall thickening and adjacent soft tissue stranding involving the descending duodenum representing duodenitis vs peptic ulcer disease. He was prescribed sucralfate and his proton pump inhibitor was increased from daily to bid. Noted patient recently quit alcohol consumption.  Today EMS was called because patient was altered, speaking slowly and having delayed responses. He was found hypotensive and lethargic, blood pressure 70/48. He was transported to the ED.    Review of Systems: As mentioned in the history of present illness. All other systems reviewed and are negative. Past Medical History:  Diagnosis Date   Acute blood loss anemia 01/05/2020   Acute respiratory failure with hypoxia (HCC)    Arthritis    COPD (chronic obstructive pulmonary disease) (HCC)    Depression    Diabetes mellitus     Diabetes mellitus without complication (HCC)    Diverticulitis    Elevated PSA 10/01/2016   Encounter for support and coordination of transition of care 01/14/2020   Hypercholesterolemia    Hyperlipidemia    Hypertension    Insomnia 01/05/2016   Ischemic colitis (HCC) 01/05/2020   Multiple lung nodules on CT 04/02/2015   Nicotine addiction    Obesity    Oxygen deficiency    qhs   Schizophrenia [facility])    Past Surgical History:  Procedure Laterality Date   BIOPSY  05/28/2023   Procedure: BIOPSY;  Surgeon: Lanelle Bal, DO;  [facility];  Service: Endoscopy;;   CATARACT EXTRACTION W/PHACO Left 11/20/2013   Procedure: CATARACT EXTRACTION PHACO AND INTRAOCULAR LENS PLACEMENT (IOC);  Surgeon: Gemma Payor, MD;  Location: [facility];  Service: Ophthalmology;  Laterality: Left;  CDE:10.26   CATARACT EXTRACTION W/PHACO Right 12/08/2013   Procedure: RIGHT EYE CATARACT EXTRACTION PHACO AND INTRAOCULAR LENS PLACEMENT ;  Surgeon: Gemma Payor, MD;  Location: [facility];  Service: Ophthalmology;  Laterality: Right;  CDE 12.38   COLONOSCOPY N/A 04/01/2013   pancolonic diverticulosis, redundant colon, large internal hemorrhoids.    COLONOSCOPY  2014   INCOMPLETE PREP IN R COLON   COLONOSCOPY N/A 04/01/2020   Procedure: COLONOSCOPY;  Surgeon: West Bali, MD;  Location: [facility];  Service: Endoscopy;  Laterality: N/A;  8:45am   ESOPHAGOGASTRODUODENOSCOPY N/A 02/18/2016   stricture at GE junction, moderate erosive gastritis and mild non-erosive duodenitis. +H.pylori. Treated initially with Prevpac. Breath test to check for eradication was positive, and he was prescribed Pylera.    ESOPHAGOGASTRODUODENOSCOPY (EGD) WITH PROPOFOL N/A  05/28/2023   Procedure: ESOPHAGOGASTRODUODENOSCOPY (EGD) WITH PROPOFOL;  Surgeon: Lanelle Bal, DO;  [facility];  Service: Endoscopy;  Laterality: N/A;  11:15am, asa 3   EYE SURGERY Left 11/2013   cataract extraction   POLYPECTOMY  04/01/2020    Procedure: POLYPECTOMY;  Surgeon: West Bali, MD;  Location[facility];  Service: Endoscopy;;   SAVORY DILATION N/A 02/18/2016   Procedure: SAVORY DILATION;  Surgeon: West Bali, MD;  Location[facility];  Service: Endoscopy;  Laterality: N/A;   SHOULDER SURGERY Right    Social History:  reports that he has been smoking cigarettes. He has a 48 pack-year smoking history. He has never used smokeless tobacco. He reports current alcohol use. He reports that he does not use drugs.  Allergies  Allergen Reactions   Sertraline Hcl     Stomach upset/pain   Wellbutrin [Bupropion] Other (See Comments)    Makes stomach hurt    Family History  Problem Relation Age of Onset   Diabetes Mother    Hypertension Mother    Stroke Mother    Diabetes Sister    Heart disease Sister    Kidney disease Father    Drug abuse Brother    Colon cancer Neg Hx    Colon polyps Neg Hx     Prior to Admission medications   Medication Sig Start Date End Date Taking? Authorizing Provider  ACCU-CHEK GUIDE test strip USE TO CHECK BLOOD SUGAR ONCE DAILY 07/31/23   Kerri Perches, MD  Accu-Chek Softclix Lancets lancets TEST BLOOD SUGAR ONCE DAILY AS DIRECTED. 07/07/19   Kerri Perches, MD  acetaminophen (TYLENOL) 500 MG tablet Take 500-1,000 mg by mouth every 6 (six) hours as needed for headache.    [provider]  albuterol (VENTOLIN HFA) 108 (90 Base) MCG/ACT inhaler INHALE 2 PUFFS INTO THE LUNGS EVERY 6 HOURS AS NEEDED FOR WHEEZING OR SHORTNESS OF BREATH. 09/21/23   Kerri Perches, MD  aspirin 81 MG EC tablet Take 81 mg by mouth daily. Swallow whole.    [provider]  Blood Glucose Monitoring Suppl (ACCU-CHEK AVIVA PLUS) w/Device KIT accucheck aviva meter Once daily testing DX e11.9 07/31/23   Kerri Perches, MD  busPIRone (BUSPAR) 10 MG tablet Take 1 tablet (10 mg total) by mouth 3 (three) times daily. 08/02/23   Kerri Perches, MD  calcium carbonate (TUMS -  DOSED IN MG ELEMENTAL CALCIUM) 500 MG chewable tablet Chew 1 tablet by mouth daily as needed for indigestion or heartburn.    [provider]  carvedilol (COREG) 6.25 MG tablet Take 6.25 mg by mouth 2 (two) times daily with a meal.    [provider]  docusate sodium (COLACE) 100 MG capsule Take 100 mg by mouth daily.    [provider]  esomeprazole (NEXIUM) 40 MG capsule Take 1 capsule (40 mg total) by mouth 2 (two) times daily before a meal. 09/25/23   Mahon, Frederik Schmidt, NP  FARXIGA 5 MG TABS tablet Take 5 mg by mouth every morning. 07/27/22   [provider]  fenofibrate (TRICOR) 145 MG tablet Take 1 tablet (145 mg total) by mouth daily. 09/17/23   Kerri Perches, MD  glipiZIDE (GLUCOTROL XL) 10 MG 24 hr tablet TAKE (2) TABLETS BY MOUTH DAILY EVERY MORNING AT BREAKFAST. 08/09/23   Kerri Perches, MD  glucose blood (ACCU-CHEK GUIDE) test strip Use as instructed 09/17/23   Kerri Perches, MD  glucose  blood (ACCU-CHEK GUIDE) test strip Use to test twice daily. 09/21/23   Kerri Perches, MD  ipratropium-albuterol (DUONEB) 0.5-2.5 (3) MG/3ML SOLN INHALE 1 VIAL VIA NEBULIZER EVERY 4 TO 6 HOURS. 06/27/23   Kerri Perches, MD  Lancets 30G MISC Once daily testing dx e11.9 11/25/21   Donell Beers, FNP  linaclotide [facility]) 145 MCG CAPS capsule Take 1 capsule (145 mcg total) by mouth daily before breakfast. 08/08/23   Aida Raider, NP  meloxicam (MOBIC) 15 MG tablet Take 1 tablet (15 mg total) by mouth daily. 09/18/23   Del Newman Nip, Tenna Child, FNP  montelukast (SINGULAIR) 10 MG tablet TAKE (1) TABLET BY MOUTH AT BEDTIME. 08/21/23   Kerri Perches, MD  Multiple Vitamins-Minerals (MULTIVITAMIN WITH MINERALS) tablet Take 1 tablet by mouth daily.    [provider]  nicotine (NICODERM CQ - DOSED IN MG/24 HOURS) 14 mg/24hr patch Place 1 patch (14 mg total) onto the skin daily. Patient not taking: Reported on 09/25/2023 12/04/22    Kerri Perches, MD  nicotine (NICODERM CQ) 21 mg/24hr patch Place 1 patch (21 mg total) onto the skin daily. Patient not taking: Reported on 09/25/2023 03/28/23   Kerri Perches, MD  olmesartan (BENICAR) 40 MG tablet TAKE ONE TABLET BY MOUTH DAILY 09/14/23   Kerri Perches, MD  ondansetron (ZOFRAN-ODT) 4 MG disintegrating tablet DISSOLVE 1 TABLET ON TONGUE ONCE DAILY AS NEEDED. 09/13/23   Aida Raider, NP  Polyethyl Glycol-Propyl Glycol 0.4-0.3 % SOLN Place 1 Application into both eyes as needed (dry eyes). Patient not taking: Reported on 09/25/2023    [provider]  risperiDONE (RISPERDAL) 1 MG tablet Take 1 tablet (1 mg total) by mouth at bedtime. 09/26/23   Kerri Perches, MD  risperidone (RISPERDAL) 4 MG tablet TAKE ONE TABLET BY MOUTH IN THE EVENING. TAKE WITH 1MG  TABLET. 07/20/23   Kerri Perches, MD  rosuvastatin (CRESTOR) 10 MG tablet TAKE ONE TABLET BY MOUTH ONCE DAILY. 07/12/23   Pricilla Riffle, MD  sucralfate (CARAFATE) 1 GM/10ML suspension Take 10 mLs (1 g total) by mouth 4 (four) times daily -  with meals and at bedtime. 09/25/23   Aida Raider, NP  SYMBICORT 160-4.5 MCG/ACT inhaler INHALE 2 PUFFS INTO THE LUNGS ONCE IN THE MORNING AND AT BEDTIME. 09/14/23   Kerri Perches, MD  tamsulosin (FLOMAX) 0.4 MG CAPS capsule Take 1 capsule (0.4 mg total) by mouth daily after supper. 09/12/23   McKenzie, Mardene Celeste, MD  traZODone (DESYREL) 50 MG tablet TAKE ONE TABLET BY MOUTH AT BEDTIME AS NEEDED FOR SLEEP. 07/20/23   Kerri Perches, MD  triamterene-hydrochlorothiazide (MAXZIDE-25) 37.5-25 MG tablet TAKE (1/2) TABLET BY MOUTH ONCE DAILY. 08/21/23   Kerri Perches, MD  sildenafil (VIAGRA) 100 MG tablet Take 100 mg by mouth. Take one tablet 30 mins before intercourse   03/11/12  [provider]    Physical Exam: Vitals:   09/30/23 0950 09/30/23 1022 09/30/23 1130 09/30/23 1315  BP: (!) 88/65  102/76 (!) 91/59  Pulse:  80 82   Resp: (!) 23   20 (!) 22  Temp:  98.6 F (37 C)    TempSrc:  Rectal    SpO2:   98%   Weight:       Neurology somnolent but easy to arouse, deconditioned and ill looking appearing. Following commands and answering to simple questions.  ENT with mild pallor Cardiovascular with S1 and S2 present and regular with  no gallops, rubs or murmurs Respiratory with no rales or wheezing, mild rhonchi Abdomen with no distention  No lower extremity edema  Data Reviewed:   Na 126, K 3,9 Cl 93, bicarbonate 22, glucose 239, bun 54 cr 2,97  AST 29, ALT 11  High sensitive troponin 68  Lactic acid 2,3  Wbc 19,7, hgb 10,1 plt 230  Alcohol < 10   CT chest, abdomen and pelvis. New patchy tree in bud centrilobular micronodularity in the right upper lobe and right middle lobe, consistnet with aspiration pneumonia. In the setting of gastric outlet obstruction from severe duodenal inflammation.  Unchanged prominent wall thickening and inflammation of the descending duodenum, most likely peptic ulcer disease or duodenitis.  New small hyperdensity in the right posterior bladder, potentially a tiny layering calculi.  Unchanged circumferential wall thickening if the distal esophagus.  Unchanged 4.3 cm ascending thoracic aneurysm. (Recommended annual follow up).    Assessment and Plan: * Right middle lobe pneumonia Community acquired pneumonia, aspiration pneumonia, (present on admission). Complicated with severe sepsis, (end organ failure AKI, acute metabolic encephalopathy).   Plan to continue antibiotic therapy with IV Unasyn. IV fluids with isotonic saline at 100 ml per hr Close follow up on blood pressure, cell count and 02 monitoring.  Aspiration precautions.   Peptic ulcer disease Continue sucralfate and bid pantoprazole.  Clear liquid diet and follow up with GI recommendations.   Chronic anemia, with acute anemia, close follow up on Hgb and Hct.  Need to rule out GI bleeding.   Chronic kidney disease, stage 3b  (HCC) AKI, hypovolemic hyponatremia.   Plan to continue volume resuscitation with isotonic saline at 100 ml per hr Follow up renal function in am, avoid hypotension and nephrotoxic medications.   Hypertension Hold on antihypertensive medications for now.  At home patient on olmesartan and carvedilol.   COPD (chronic obstructive pulmonary disease) (HCC) No signs of acute exacerbation. Continue with bronchodilator therapy.  Continue 02 monitoring.   Coronary atherosclerosis of native coronary artery No chest pain, troponin elevation due to sepsis, no acute coronary syndrome.   GERD (gastroesophageal reflux disease) Continue antiacid therapy.   Type 2 diabetes mellitus with hyperlipidemia (HCC) Uncontrolled hyperglycemia.   Holds on oral antihyperglycemic agents.  Continue glucose cover and monitoring with insulin sliding scale.   Continue with fenofibrate and rosuvastatin.   Schizophrenia, unspecified type (HCC) Continue with risperidone, and trazodone     Advance Care Planning:   Code Status: Full Code   Consults: full  Family Communication: no family at the bedside.  I spoke with patient's sister at the bedside, we talked in detail about patient's condition, plan of care and prognosis and all questions were addressed.  Severity of Illness: The appropriate patient status for this patient is INPATIENT. Inpatient status is judged to be reasonable and necessary in order to provide the required intensity of service to ensure the patient's safety. The patient's presenting symptoms, physical exam findings, and initial radiographic and laboratory data in the context of their chronic comorbidities is felt to place them at high risk for further clinical deterioration. Furthermore, it is not anticipated that the patient will be medically stable for discharge from the hospital within 2 midnights of admission.   * I certify that at the point of admission it is my clinical judgment that  the patient will require inpatient hospital care spanning beyond 2 midnights from the point of admission due to high intensity of service, high risk for further deterioration and high  frequency of surveillance required.*  Author: Coralie Keens, MD 09/30/2023 1:38 PM  For on call review [email or web address].

## 2023-09-30 NOTE — Plan of Care (Signed)
  Problem: Education: Goal: Knowledge of General Education information will improve Description: Including pain rating scale, medication(s)/side effects and non-pharmacologic comfort measures Outcome: Not Progressing   Problem: Health Behavior/Discharge Planning: Goal: Ability to manage health-related needs will improve Outcome: Not Progressing   

## 2023-09-30 NOTE — Assessment & Plan Note (Addendum)
Continue sucralfate and bid pantoprazole.  Clear liquid diet and follow up with GI recommendations.   Chronic anemia, with acute anemia, close follow up on Hgb and Hct.  Need to rule out GI bleeding.

## 2023-09-30 NOTE — Assessment & Plan Note (Signed)
No chest pain, troponin elevation due to sepsis, no acute coronary syndrome.

## 2023-09-30 NOTE — ED Provider Notes (Signed)
[place] EMERGENCY DEPARTMENT AT [facility] Provider Note  CSN: [id number] Arrival date & time: 09/30/23 0930  Chief Complaint(s) Hypotension  HPI Matthew Molina is a 72 y.o. male with PMH COPD, T2DM, HTN, HLD, ischemic colitis, peptic ulcer disease who presents emergency room for evaluation of abdominal pain decreased p.o. intake and hypotension.  History obtained from patient's friend who states that patient has been eating significantly less over the last 2 to 3 days and has been complaining of persistent epigastric abdominal pain.  Patient arrives somnolent and will answer basic questions but is not back to his normal self according to the friend.  Patient denies shortness of breath, chest pain, headache, fever or other systemic symptoms.  Friend endorses black vomitus and black stools.   Past Medical History Past Medical History:  Diagnosis Date   Acute blood loss anemia 01/05/2020   Acute respiratory failure with hypoxia (HCC)    Arthritis    COPD (chronic obstructive pulmonary disease) (HCC)    Depression    Diabetes mellitus    Diabetes mellitus without complication (HCC)    Diverticulitis    Elevated PSA 10/01/2016   Encounter for support and coordination of transition of care 01/14/2020   Hypercholesterolemia    Hyperlipidemia    Hypertension    Insomnia 01/05/2016   Ischemic colitis (HCC) 01/05/2020   Multiple lung nodules on CT 04/02/2015   Nicotine addiction    Obesity    Oxygen deficiency    qhs   Schizophrenia [facility])    Patient Active Problem List   Diagnosis Date Noted   Duodenitis 09/18/2023   Type 2 diabetes mellitus with vascular disease (HCC) 08/09/2023   Chronic pancreatitis (HCC) 06/30/2023   Weight loss, non-intentional 06/30/2023   Acute pancreatitis 04/30/2023   Encounter for support and coordination of transition of care 12/17/2022   COPD exacerbation (HCC) 12/08/2022   Influenza A 12/08/2022   Encounter for Medicare annual  examination with abnormal findings 09/19/2022   Hip pain, right 09/19/2022   Shoulder pain, left 08/09/2022   Arm pain 01/27/2022   Multiple environmental allergies 01/02/2022   Pes planus 08/03/2021   Abnormal LFTs 06/28/2021   Benign prostatic hyperplasia with urinary obstruction 10/11/2020   Emphysema lung (HCC) 09/19/2020   Tubular adenoma of colon 09/13/2020   Incomplete emptying of bladder 09/08/2020   Musculoskeletal leg pain, right 08/11/2020   Hip pain, bilateral 08/11/2020   Hemorrhoids 03/18/2020   Muscle pain 01/22/2020   QT prolongation 01/14/2020   Chronic respiratory failure with hypoxia (HCC) 01/06/2020   Diverticulosis of colon    Urinary incontinence 10/08/2019   Unsteady gait 04/18/2017   Elevated PSA 10/01/2016   GERD (gastroesophageal reflux disease) 08/24/2016   Chronic kidney disease, stage 3b (HCC) 05/22/2016   COPD (chronic obstructive pulmonary disease) (HCC) 11/05/2015   Hypertension 11/05/2015   Tobacco abuse 07/24/2015   Coronary atherosclerosis of native coronary artery 04/02/2015   Multiple lung nodules on CT 04/02/2015   GAD (generalized anxiety disorder) 07/04/2012   Back pain with radiation 11/01/2011   Hyperlipidemia 06/02/2008   Obesity (BMI 30.0-34.9) 06/02/2008   Schizophrenia, unspecified type (HCC) 06/02/2008   Home Medication(s) Prior to Admission medications   Medication Sig Start Date End Date Taking? Authorizing Provider  ACCU-CHEK GUIDE test strip USE TO CHECK BLOOD SUGAR ONCE DAILY 07/31/23   Kerri Perches, MD  Accu-Chek Softclix Lancets lancets TEST BLOOD SUGAR ONCE DAILY AS DIRECTED. 07/07/19   Kerri Perches, MD  acetaminophen (  TYLENOL) 500 MG tablet Take 500-1,000 mg by mouth every 6 (six) hours as needed for headache.    [provider]  albuterol (VENTOLIN HFA) 108 (90 Base) MCG/ACT inhaler INHALE 2 PUFFS INTO THE LUNGS EVERY 6 HOURS AS NEEDED FOR WHEEZING OR SHORTNESS OF BREATH. 09/21/23   Kerri Perches, MD  aspirin 81 MG EC tablet Take 81 mg by mouth daily. Swallow whole.    [provider]  Blood Glucose Monitoring Suppl (ACCU-CHEK AVIVA PLUS) w/Device KIT accucheck aviva meter Once daily testing DX e11.9 07/31/23   Kerri Perches, MD  busPIRone (BUSPAR) 10 MG tablet Take 1 tablet (10 mg total) by mouth 3 (three) times daily. 08/02/23   Kerri Perches, MD  calcium carbonate (TUMS - DOSED IN MG ELEMENTAL CALCIUM) 500 MG chewable tablet Chew 1 tablet by mouth daily as needed for indigestion or heartburn.    [provider]  carvedilol (COREG) 6.25 MG tablet Take 6.25 mg by mouth 2 (two) times daily with a meal.    [provider]  docusate sodium (COLACE) 100 MG capsule Take 100 mg by mouth daily.    [provider]  esomeprazole (NEXIUM) 40 MG capsule Take 1 capsule (40 mg total) by mouth 2 (two) times daily before a meal. 09/25/23   Mahon, Frederik Schmidt, NP  FARXIGA 5 MG TABS tablet Take 5 mg by mouth every morning. 07/27/22   [provider]  fenofibrate (TRICOR) 145 MG tablet Take 1 tablet (145 mg total) by mouth daily. 09/17/23   Kerri Perches, MD  glipiZIDE (GLUCOTROL XL) 10 MG 24 hr tablet TAKE (2) TABLETS BY MOUTH DAILY EVERY MORNING AT BREAKFAST. 08/09/23   Kerri Perches, MD  glucose blood (ACCU-CHEK GUIDE) test strip Use as instructed 09/17/23   Kerri Perches, MD  glucose blood (ACCU-CHEK GUIDE) test strip Use to test twice daily. 09/21/23   Kerri Perches, MD  ipratropium-albuterol (DUONEB) 0.5-2.5 (3) MG/3ML SOLN INHALE 1 VIAL VIA NEBULIZER EVERY 4 TO 6 HOURS. 06/27/23   Kerri Perches, MD  Lancets 30G MISC Once daily testing dx e11.9 11/25/21   Donell Beers, FNP  linaclotide [facility]) 145 MCG CAPS capsule Take 1 capsule (145 mcg total) by mouth daily before breakfast. 08/08/23   Aida Raider, NP  meloxicam (MOBIC) 15 MG tablet Take 1 tablet (15 mg total) by mouth daily. 09/18/23   Del Newman Nip, Tenna Child,  FNP  montelukast (SINGULAIR) 10 MG tablet TAKE (1) TABLET BY MOUTH AT BEDTIME. 08/21/23   Kerri Perches, MD  Multiple Vitamins-Minerals (MULTIVITAMIN WITH MINERALS) tablet Take 1 tablet by mouth daily.    [provider]  nicotine (NICODERM CQ - DOSED IN MG/24 HOURS) 14 mg/24hr patch Place 1 patch (14 mg total) onto the skin daily. Patient not taking: Reported on 09/25/2023 12/04/22   Kerri Perches, MD  nicotine (NICODERM CQ) 21 mg/24hr patch Place 1 patch (21 mg total) onto the skin daily. Patient not taking: Reported on 09/25/2023 03/28/23   Kerri Perches, MD  olmesartan (BENICAR) 40 MG tablet TAKE ONE TABLET BY MOUTH DAILY 09/14/23   Kerri Perches, MD  ondansetron (ZOFRAN-ODT) 4 MG disintegrating tablet DISSOLVE 1 TABLET ON TONGUE ONCE DAILY AS NEEDED. 09/13/23   Aida Raider, NP  Polyethyl Glycol-Propyl Glycol 0.4-0.3 % SOLN Place 1 Application into both eyes as needed (dry eyes). Patient not taking: Reported on 09/25/2023    [provider]  risperiDONE (RISPERDAL)  1 MG tablet Take 1 tablet (1 mg total) by mouth at bedtime. 09/26/23   Kerri Perches, MD  risperidone (RISPERDAL) 4 MG tablet TAKE ONE TABLET BY MOUTH IN THE EVENING. TAKE WITH 1MG  TABLET. 07/20/23   Kerri Perches, MD  rosuvastatin (CRESTOR) 10 MG tablet TAKE ONE TABLET BY MOUTH ONCE DAILY. 07/12/23   Pricilla Riffle, MD  sucralfate (CARAFATE) 1 GM/10ML suspension Take 10 mLs (1 g total) by mouth 4 (four) times daily -  with meals and at bedtime. 09/25/23   Aida Raider, NP  SYMBICORT 160-4.5 MCG/ACT inhaler INHALE 2 PUFFS INTO THE LUNGS ONCE IN THE MORNING AND AT BEDTIME. 09/14/23   Kerri Perches, MD  tamsulosin (FLOMAX) 0.4 MG CAPS capsule Take 1 capsule (0.4 mg total) by mouth daily after supper. 09/12/23   McKenzie, Mardene Celeste, MD  traZODone (DESYREL) 50 MG tablet TAKE ONE TABLET BY MOUTH AT BEDTIME AS NEEDED FOR SLEEP. 07/20/23   Kerri Perches, MD   triamterene-hydrochlorothiazide (MAXZIDE-25) 37.5-25 MG tablet TAKE (1/2) TABLET BY MOUTH ONCE DAILY. 08/21/23   Kerri Perches, MD  sildenafil (VIAGRA) 100 MG tablet Take 100 mg by mouth. Take one tablet 30 mins before intercourse   03/11/12  [provider]                                                                                                                                    Past Surgical History Past Surgical History:  Procedure Laterality Date   BIOPSY  05/28/2023   Procedure: BIOPSY;  Surgeon: Lanelle Bal, DO;  Location: [facility];  Service: Endoscopy;;   CATARACT EXTRACTION W/PHACO Left 11/20/2013   Procedure: CATARACT EXTRACTION PHACO AND INTRAOCULAR LENS PLACEMENT (IOC);  Surgeon: Gemma Payor, MD;  Location: [facility] ORS;  Service: Ophthalmology;  Laterality: Left;  CDE:10.26   CATARACT EXTRACTION W/PHACO Right 12/08/2013   Procedure: RIGHT EYE CATARACT EXTRACTION PHACO AND INTRAOCULAR LENS PLACEMENT ;  Surgeon: Gemma Payor, MD;  Location: [facility] ORS;  Service: Ophthalmology;  Laterality: Right;  CDE 12.38   COLONOSCOPY N/A 04/01/2013   pancolonic diverticulosis, redundant colon, large internal hemorrhoids.    COLONOSCOPY  2014   INCOMPLETE PREP IN R COLON   COLONOSCOPY N/A 04/01/2020   Procedure: COLONOSCOPY;  Surgeon: West Bali, MD;  Location: [facility];  Service: Endoscopy;  Laterality: N/A;  8:45am   ESOPHAGOGASTRODUODENOSCOPY N/A 02/18/2016   stricture at GE junction, moderate erosive gastritis and mild non-erosive duodenitis. +H.pylori. Treated initially with Prevpac. Breath test to check for eradication was positive, and he was prescribed Pylera.    ESOPHAGOGASTRODUODENOSCOPY (EGD) WITH PROPOFOL N/A 05/28/2023   Procedure: ESOPHAGOGASTRODUODENOSCOPY (EGD) WITH PROPOFOL;  Surgeon: Lanelle Bal, DO;  Location: [facility];  Service: Endoscopy;  Laterality: N/A;  11:15am, asa 3   EYE SURGERY Left 11/2013   cataract extraction   POLYPECTOMY   04/01/2020  Procedure: POLYPECTOMY;  Surgeon: West Bali, MD;  Location: [facility];  Service: Endoscopy;;   SAVORY DILATION N/A 02/18/2016   Procedure: SAVORY DILATION;  Surgeon: West Bali, MD;  Location: [facility];  Service: Endoscopy;  Laterality: N/A;   SHOULDER SURGERY Right    Family History Family History  Problem Relation Age of Onset   Diabetes Mother    Hypertension Mother    Stroke Mother    Diabetes Sister    Heart disease Sister    Kidney disease Father    Drug abuse Brother    Colon cancer Neg Hx    Colon polyps Neg Hx     Social History Social History   Tobacco Use   Smoking status: Every Day    Current packs/day: 1.00    Average packs/day: 1 pack/day for 48.0 years (48.0 ttl pk-yrs)    Types: Cigarettes   Smokeless tobacco: Never  Vaping Use   Vaping status: Never Used  Substance Use Topics   Alcohol use: Yes    Alcohol/week: 0.0 standard drinks of alcohol    Comment: "little bit of beer"   Drug use: No   Allergies Sertraline hcl and Wellbutrin [bupropion]  Review of Systems Review of Systems  Constitutional:  Positive for fatigue.  Gastrointestinal:  Positive for abdominal pain, blood in stool, nausea and vomiting.    Physical Exam Vital Signs  I have reviewed the triage vital signs Wt 92.1 kg   SpO2 97%   BMI 26.78 kg/m   Physical Exam Constitutional:      General: He is not in acute distress.    Appearance: Normal appearance. He is ill-appearing.  HENT:     Head: Normocephalic and atraumatic.     Comments: Dry tacky mucous membranes    Nose: No congestion or rhinorrhea.  Eyes:     General:        Right eye: No discharge.        Left eye: No discharge.     Extraocular Movements: Extraocular movements intact.     Pupils: Pupils are equal, round, and reactive to light.  Cardiovascular:     Rate and Rhythm: Normal rate and regular rhythm.     Heart sounds: No murmur heard. Pulmonary:     Effort: No respiratory  distress.     Breath sounds: No wheezing or rales.  Abdominal:     General: There is no distension.     Tenderness: There is abdominal tenderness.  Musculoskeletal:        General: Normal range of motion.     Cervical back: Normal range of motion.  Skin:    General: Skin is warm and dry.  Neurological:     General: No focal deficit present.     Mental Status: He is alert.     ED Results and Treatments Labs (all labs ordered are listed, but only abnormal results are displayed) Labs Reviewed  COMPREHENSIVE METABOLIC PANEL  CBC WITH DIFFERENTIAL/PLATELET  ETHANOL  LACTIC ACID, PLASMA  LACTIC ACID, PLASMA  CBG MONITORING, ED  CBG MONITORING, ED  TROPONIN I (HIGH SENSITIVITY)  Radiology No results found.  Pertinent labs & imaging results that were available during my care of the patient were reviewed by me and considered in my medical decision making (see MDM for details).  Medications Ordered in ED Medications  lactated ringers bolus 1,000 mL (has no administration in time range)  naloxone [facility]) injection 0.4 mg (has no administration in time range)                                                                                                                                     Procedures .Critical Care  Performed by: Glendora Score, MD Authorized by: Glendora Score, MD   Critical care provider statement:    Critical care time (minutes):  30   Critical care was necessary to treat or prevent imminent or life-threatening deterioration of the following conditions:  Renal failure, shock and circulatory failure   Critical care was time spent personally by me on the following activities:  Development of treatment plan with patient or surrogate, discussions with consultants, evaluation of patient's response to treatment, examination of patient, ordering  and review of laboratory studies, ordering and review of radiographic studies, ordering and performing treatments and interventions, pulse oximetry, re-evaluation of patient's condition and review of old charts   (including critical care time)  Medical Decision Making / ED Course   This patient presents to the ED for concern of fatigue, abdominal pain, hematochezia, this involves an extensive number of treatment options, and is a complaint that carries with it a high risk of complications and morbidity.  The differential diagnosis includes upper GI bleed, lower GI bleed, peptic ulcer disease, perforated ulcer, aspiration pneumonia, dehydration, electrolyte abnormality, anemia  MDM: Patient seen emergency room for evaluation of fatigue, abdominal pain and hypotension.  Patient arrives hypotensive with systolics in the 80s.  Physical exam with epigastric tenderness to palpation, faint rales at the right lower lung base.  Laboratory evaluation concerning with a leukocytosis to 19.7, hemoglobin 10.1, high-sensitivity troponin elevated to 68 likely type II demand ischemia, significant hyponatremia to 126, BUN 54, creatinine 2.97 which is a significant elevation for this patient, lactic 2.3..  Aggressive fluid resuscitation begun and hypotension improving.  CT chest abdomen pelvis concerning for persistent duodenal inflammation and esophagitis with aspiration pneumonia.  Patient covered with ceftriaxone azithromycin.  Pantoprazole given for suspected GI bleeding.  GI consulted who is recommending clear liquid diet, inpatient GI consultation patient require hospital admission for severe AKI and aspiration pneumonia.   Additional history obtained: -Additional history obtained from multiple family members -External records from outside source obtained and reviewed including: Chart review including previous notes, labs, imaging, consultation notes   Lab Tests: -I ordered, reviewed, and interpreted labs.    The pertinent results include:   Labs Reviewed  COMPREHENSIVE METABOLIC PANEL  CBC WITH DIFFERENTIAL/PLATELET  ETHANOL  LACTIC ACID, PLASMA  LACTIC ACID, PLASMA  CBG MONITORING, ED  CBG MONITORING,  ED  TROPONIN I (HIGH SENSITIVITY)      EKG   EKG Interpretation Date/Time:  Sunday September 30 2023 09:43:24 EDT Ventricular Rate:  88 PR Interval:  217 QRS Duration:  85 QT Interval:  374 QTC Calculation: 453 R Axis:   -51  Text Interpretation: Sinus rhythm Confirmed by Chellsie Gomer ([id number]) on 09/30/2023 11:52:15 AM         Imaging Studies ordered: I ordered imaging studies including CT chest abdomen pelvis I independently visualized and interpreted imaging. I agree with the radiologist interpretation   Medicines ordered and prescription drug management: Meds ordered this encounter  Medications   lactated ringers bolus 1,000 mL   naloxone (NARCAN) injection 0.4 mg    -I have reviewed the patients home medicines and have made adjustments as needed  Critical interventions Fluid resuscitation, PPI, antibiotics  Consultations Obtained: I requested consultation with the gastroenterology on-call Dr. Jena Gauss,  and discussed lab and imaging findings as well as pertinent plan - they recommend: Clear liquid diet, inpatient GI consultation   Cardiac Monitoring: The patient was maintained on a cardiac monitor.  I personally viewed and interpreted the cardiac monitored which showed an underlying rhythm of: NSR  Social Determinants of Health:  Factors impacting patients care include: none   Reevaluation: After the interventions noted above, I reevaluated the patient and found that they have :improved  Co morbidities that complicate the patient evaluation  Past Medical History:  Diagnosis Date   Acute blood loss anemia 01/05/2020   Acute respiratory failure with hypoxia (HCC)    Arthritis    COPD (chronic obstructive pulmonary disease) (HCC)    Depression    Diabetes  mellitus    Diabetes mellitus without complication (HCC)    Diverticulitis    Elevated PSA 10/01/2016   Encounter for support and coordination of transition of care 01/14/2020   Hypercholesterolemia    Hyperlipidemia    Hypertension    Insomnia 01/05/2016   Ischemic colitis (HCC) 01/05/2020   Multiple lung nodules on CT 04/02/2015   Nicotine addiction    Obesity    Oxygen deficiency    qhs   Schizophrenia (HCC)       Dispostion: I considered admission for this patient, and patient require hospital admission for severe AKI and aspiration pneumonia     Final Clinical Impression(s) / ED Diagnoses Final diagnoses:  None     @PCDICTATION @    Glendora Score, MD 09/30/23 [phone]

## 2023-09-30 NOTE — ED Triage Notes (Addendum)
BIB [facility] from home for AMS, lethargy, low BP and general weakness, some confusion noted. Awake, interactive, follows some commands, NAD, resps e/u, speaking slowly, delayed responses. Caregiver on scene, arrived at 4a. States pt sleeping more. Low BP for EMS 70/48. Other VSS. BS 271. NSL 18g L AC. Denies pain, denies pain fall.

## 2023-09-30 NOTE — Assessment & Plan Note (Signed)
No signs of acute exacerbation. Continue with bronchodilator therapy.  Continue 02 monitoring.

## 2023-09-30 NOTE — Assessment & Plan Note (Signed)
Hold on antihypertensive medications for now.  At home patient on olmesartan and carvedilol.

## 2023-09-30 NOTE — Assessment & Plan Note (Signed)
Continue antiacid therapy  ?

## 2023-09-30 NOTE — Assessment & Plan Note (Signed)
Community acquired pneumonia, aspiration pneumonia, (present on admission). Complicated with severe sepsis, (end organ failure AKI, acute metabolic encephalopathy).   Plan to continue antibiotic therapy with IV Unasyn. IV fluids with isotonic saline at 100 ml per hr Close follow up on blood pressure, cell count and 02 monitoring.  Aspiration precautions.

## 2023-09-30 NOTE — ED Notes (Signed)
ED TO INPATIENT HANDOFF REPORT  ED Nurse Name and Phone #: Wandra Mannan, Paramedic  S Name/Age/Gender Matthew Molina 72 y.o. male Room/Bed: APA04/APA04  Code Status   Code Status: Full Code  Home/SNF/Other Home Patient oriented to: self, place, time, and situation Is this baseline? Yes   Triage Complete: Triage complete  Chief Complaint Upper GI bleeding [K92.2]  Triage Note BIB [facility] from home for AMS, lethargy, low BP and general weakness, some confusion noted. Awake, interactive, follows some commands, NAD, resps e/u, speaking slowly, delayed responses. Caregiver on scene, arrived at 4a. States pt sleeping more. Low BP for EMS 70/48. Other VSS. BS 271. NSL 18g L AC. Denies pain, denies pain fall.   Allergies Allergies  Allergen Reactions   Sertraline Hcl     Stomach upset/pain   Wellbutrin [Bupropion] Other (See Comments)    Makes stomach hurt    Level of Care/Admitting Diagnosis ED Disposition     ED Disposition  Admit   Condition  --   Comment  Hospital Area: [facility] [[id number]]  Level of Care: Telemetry [5]  Covid Evaluation: Asymptomatic - no recent exposure (last 10 days) testing not required  Diagnosis: Upper GI bleeding [782956]  Admitting Physician: Coralie Keens [[id number]]  Attending Physician: Coralie Keens [[id number]]  Certification:: I certify this patient will need inpatient services for at least 2 midnights  Expected Medical Readiness: 10/02/2023          B Medical/Surgery History Past Medical History:  Diagnosis Date   Acute blood loss anemia 01/05/2020   Acute respiratory failure with hypoxia (HCC)    Arthritis    COPD (chronic obstructive pulmonary disease) (HCC)    Depression    Diabetes mellitus    Diabetes mellitus without complication (HCC)    Diverticulitis    Elevated PSA 10/01/2016   Encounter for support and coordination of transition of care 01/14/2020   Hypercholesterolemia     Hyperlipidemia    Hypertension    Insomnia 01/05/2016   Ischemic colitis (HCC) 01/05/2020   Multiple lung nodules on CT 04/02/2015   Nicotine addiction    Obesity    Oxygen deficiency    qhs   Schizophrenia (HCC)    Past Surgical History:  Procedure Laterality Date   BIOPSY  05/28/2023   Procedure: BIOPSY;  Surgeon: Lanelle Bal, DO;  [facility];  Service: Endoscopy;;   CATARACT EXTRACTION W/PHACO Left 11/20/2013   Procedure: CATARACT EXTRACTION PHACO AND INTRAOCULAR LENS PLACEMENT (IOC);  Surgeon: Gemma Payor, MD;  Location: [facility];  Service: Ophthalmology;  Laterality: Left;  CDE:10.26   CATARACT EXTRACTION W/PHACO Right 12/08/2013   Procedure: RIGHT EYE CATARACT EXTRACTION PHACO AND INTRAOCULAR LENS PLACEMENT ;  Surgeon: Gemma Payor, MD;  Location: [facility];  Service: Ophthalmology;  Laterality: Right;  CDE 12.38   COLONOSCOPY N/A 04/01/2013   pancolonic diverticulosis, redundant colon, large internal hemorrhoids.    COLONOSCOPY  2014   INCOMPLETE PREP IN R COLON   COLONOSCOPY N/A 04/01/2020   Procedure: COLONOSCOPY;  Surgeon: West Bali, MD;  [facility];  Service: Endoscopy;  Laterality: N/A;  8:45am   ESOPHAGOGASTRODUODENOSCOPY N/A 02/18/2016   stricture at GE junction, moderate erosive gastritis and mild non-erosive duodenitis. +H.pylori. Treated initially with Prevpac. Breath test to check for eradication was positive, and he was prescribed Pylera.    ESOPHAGOGASTRODUODENOSCOPY (EGD) WITH PROPOFOL N/A 05/28/2023   Procedure: ESOPHAGOGASTRODUODENOSCOPY (EGD) WITH PROPOFOL;  Surgeon: Lanelle Bal, DO;  [facility];  Service: Endoscopy;  Laterality: N/A;  11:15am, asa 3   EYE SURGERY Left 11/2013   cataract extraction   POLYPECTOMY  04/01/2020   Procedure: POLYPECTOMY;  Surgeon: West Bali, MD;  [facility];  Service: Endoscopy;;   SAVORY DILATION N/A 02/18/2016   Procedure: SAVORY DILATION;  Surgeon: West Bali, MD;  [facility];  Service: Endoscopy;  Laterality: N/A;   SHOULDER SURGERY Right      A IV Location/Drains/Wounds Patient Lines/Drains/Airways Status     Active Line/Drains/Airways     Name Placement date Placement time Site Days   Peripheral IV 09/30/23 18 G 1" Left Antecubital 09/30/23  0938  Antecubital  less than 1            Intake/Output Last 24 hours  Intake/Output Summary (Last 24 hours) at 09/30/2023 1227 Last data filed at 09/30/2023 1226 Gross per 24 hour  Intake 2500 ml  Output 250 ml  Net 2250 ml    Labs/Imaging Results for orders placed or performed during the hospital encounter of 09/30/23 (from the past 48 hour(s))  Ethanol     Status: None   Collection Time: 09/30/23  9:49 AM  Result Value Ref Range   Alcohol, Ethyl (B) <10 <10 mg/dL    Comment: (NOTE) Lowest detectable limit for serum alcohol is 10 mg/dL.  For medical purposes only. Performed at [facility], [address] [place], [place] [address]   Comprehensive metabolic panel     Status: Abnormal   Collection Time: 09/30/23  9:54 AM  Result Value Ref Range   Sodium 126 (L) 135 - 145 mmol/L   Potassium 3.9 3.5 - 5.1 mmol/L   Chloride 93 (L) 98 - 111 mmol/L   CO2 22 22 - 32 mmol/L   Glucose, Bld 239 (H) 70 - 99 mg/dL    Comment: Glucose reference range applies only to samples taken after fasting for at least 8 hours.   BUN 54 (H) 8 - 23 mg/dL   Creatinine, Ser 0.86 (H) 0.61 - 1.24 mg/dL   Calcium 9.3 8.9 - 57.8 mg/dL   Total Protein 6.0 (L) 6.5 - 8.1 g/dL   Albumin 3.2 (L) 3.5 - 5.0 g/dL   AST 29 15 - 41 U/L   ALT 11 0 - 44 U/L   Alkaline Phosphatase 24 (L) 38 - 126 U/L   Total Bilirubin 0.9 0.3 - 1.2 mg/dL   GFR, Estimated 22 (L) >60 mL/min    Comment: (NOTE) Calculated using the CKD-EPI Creatinine Equation (2021)    Anion gap 11 5 - 15    Comment: Performed at [facility], [address] [place], [place] [address]  CBC with Differential     Status: Abnormal    Collection Time: 09/30/23  9:54 AM  Result Value Ref Range   WBC 19.7 (H) 4.0 - 10.5 K/uL   RBC 3.51 (L) 4.22 - 5.81 MIL/uL   Hemoglobin 10.1 (L) 13.0 - 17.0 g/dL   HCT 95.2 (L) 84.1 - 32.4 %   MCV 89.5 80.0 - 100.0 fL   MCH 28.8 26.0 - 34.0 pg   MCHC 32.2 30.0 - 36.0 g/dL   RDW 40.1 02.7 - 25.3 %   Platelets 230 150 - 400 K/uL   nRBC 0.0 0.0 - 0.2 %   Neutrophils Relative % 91 %   Neutro Abs 17.9 (H) 1.7 - 7.7 K/uL   Lymphocytes Relative 6 %   Lymphs  Abs 1.2 0.7 - 4.0 K/uL   Monocytes Relative 3 %   Monocytes Absolute 0.6 0.1 - 1.0 K/uL   Eosinophils Relative 0 %   Eosinophils Absolute 0.0 0.0 - 0.5 K/uL   Basophils Relative 0 %   Basophils Absolute 0.0 0.0 - 0.1 K/uL   WBC Morphology VACUOLATED NEUTROPHILS    RBC Morphology MORPHOLOGY UNREMARKABLE    Smear Review MORPHOLOGY UNREMARKABLE    Abs Immature Granulocytes 0.00 0.00 - 0.07 K/uL    Comment: Performed at [facility], [address], [place], [place] [address]  Troponin I (High Sensitivity)     Status: Abnormal   Collection Time: 09/30/23  9:54 AM  Result Value Ref Range   Troponin I (High Sensitivity) 68 (H) <18 ng/L    Comment: (NOTE) Elevated high sensitivity troponin I (hsTnI) values and significant  changes across serial measurements may suggest ACS but many other  chronic and acute conditions are known to elevate hsTnI results.  Refer to the "Links" section for chest pain algorithms and additional  guidance. Performed at [facility], [address], [place], [place] [address]   Lactic acid, plasma     Status: Abnormal   Collection Time: 09/30/23  9:54 AM  Result Value Ref Range   Lactic Acid, Venous 2.3 (HH) 0.5 - 1.9 mmol/L    Comment: CRITICAL RESULT CALLED TO, READ BACK BY AND VERIFIED WITH M Shailen Thielen AT 1024 ON [id number] BY S DALTON Performed at [facility], [address] [place], [place] [address]   CBG monitoring, ED     Status: Abnormal   Collection Time: 09/30/23 10:17 AM  Result Value Ref  Range   Glucose-Capillary 211 (H) 70 - 99 mg/dL    Comment: Glucose reference range applies only to samples taken after fasting for at least 8 hours.  Lactic acid, plasma     Status: Abnormal   Collection Time: 09/30/23 11:35 AM  Result Value Ref Range   Lactic Acid, Venous 2.2 (HH) 0.5 - 1.9 mmol/L    Comment: CRITICAL VALUE NOTED. VALUE IS CONSISTENT WITH PREVIOUSLY REPORTED/CALLED VALUE Performed at [facility], [address] [place], [place] [address]   Troponin I (High Sensitivity)     Status: Abnormal   Collection Time: 09/30/23 11:35 AM  Result Value Ref Range   Troponin I (High Sensitivity) 66 (H) <18 ng/L    Comment: (NOTE) Elevated high sensitivity troponin I (hsTnI) values and significant  changes across serial measurements may suggest ACS but many other  chronic and acute conditions are known to elevate hsTnI results.  Refer to the "Links" section for chest pain algorithms and additional  guidance. Performed at [facility], [address], [place], [place] [address]   Na and K (sodium & potassium), rand urine     Status: None   Collection Time: 09/30/23 11:46 AM  Result Value Ref Range   Sodium, Ur 12 mmol/L   Potassium Urine 62 mmol/L    Comment: Performed at [facility], [address], [place], [place] [address]   *Note: Due to a large number of results and/or encounters for the requested time period, some results have not been displayed. A complete set of results can be found in Results Review.   CT CHEST ABDOMEN PELVIS WO CONTRAST  Result Date: 09/30/2023 CLINICAL DATA:  Altered mental status and lethargy.  Sepsis. EXAM: CT CHEST, ABDOMEN AND PELVIS WITHOUT CONTRAST TECHNIQUE: Multidetector CT imaging of the chest, abdomen and pelvis was performed following the standard  protocol without IV contrast. RADIATION DOSE REDUCTION: This exam was performed according to the departmental dose-optimization program which includes automated exposure control, adjustment of the  mA and/or kV according to patient size and/or use of iterative reconstruction technique. COMPARISON:  CT abdomen pelvis dated September 08, 2023. CT chest dated March 05, 2023. FINDINGS: CT CHEST FINDINGS Cardiovascular: No significant vascular findings. Normal heart size. No pericardial effusion. Unchanged mildly dilated ascending thoracic aorta measuring up 4.3 cm in diameter. Coronary, aortic arch, and branch vessel atherosclerotic vascular disease. Mediastinum/Nodes: No enlarged mediastinal, hilar, or axillary lymph nodes. Unchanged circumferential wall thickening of the distal esophagus. Normal thyroid gland. Lungs/Pleura: Small amount of debris in the trachea. New patchy tree-in-bud centrilobular micro nodularity in the right upper lobe and right middle lobe. Subsegmental atelectasis in the right middle lobe. Unchanged trace left pleural effusion. Mild paraseptal emphysema. No pneumothorax. Musculoskeletal: No acute or significant osseous findings. CT ABDOMEN PELVIS FINDINGS Hepatobiliary: No focal liver abnormality is seen. No gallstones, gallbladder wall thickening, or biliary dilatation. Pancreas: Mild inflammatory change in the region of the pancreatic head is likely related to descending duodenal inflammation as seen on the prior CT. No ductal dilatation. Spleen: Normal in size without focal abnormality. Adrenals/Urinary Tract: Unchanged 1.2 cm right adrenal myelolipoma. No follow-up imaging is recommended. Unchanged nodular thickening of the left adrenal gland. Unchanged bilateral renal simple and hemorrhagic cysts. No follow-up imaging is recommended. No renal calculi or hydronephrosis. New small hyperdensity in the right posterolateral bladder. No bladder wall thickening. Stomach/Bowel: Unchanged small hiatal hernia. The stomach remains distended. Prominent wall thickening of the descending duodenum is not significantly changed, although less well evaluated without intravenous contrast compared to the  prior study. Remaining small bowel is unremarkable. Colonic diverticulosis. Normal appendix. Vascular/Lymphatic: Aortic atherosclerosis. No enlarged abdominal or pelvic lymph nodes. Reproductive: Unchanged prostatomegaly. Other: No free fluid or pneumoperitoneum. Musculoskeletal: No acute or significant osseous findings. IMPRESSION: 1. New patchy tree-in-bud centrilobular micronodularity in the right upper lobe and right middle lobe, consistent with aspiration pneumonia in the setting of gastric outlet obstruction from severe duodenal inflammation. 2. Unchanged prominent wall thickening and inflammation of the descending duodenum, most likely peptic ulcer disease or duodenitis. No extraluminal air or fluid collection. 3. New small hyperdensity in the right posterolateral bladder, potentially tiny layering bladder calculi. 4. Unchanged circumferential wall thickening of the distal esophagus, likely chronic esophagitis. 5. Unchanged 4.3 cm ascending thoracic aortic aneurysm. Recommend annual imaging followup by CTA or MRA. This recommendation follows 2010 ACCF/AHA/AATS/ACR/ASA/SCA/SCAI/SIR/STS/SVM Guidelines for the Diagnosis and Management of Patients with Thoracic Aortic Disease. Circulation. 2010; 121: W295-A213. Aortic aneurysm NOS (ICD10-I71.9) 6. Aortic Atherosclerosis (ICD10-I70.0) and Emphysema (ICD10-J43.9). Electronically Signed   By: Obie Dredge M.D.   On: 09/30/2023 11:45    Pending Labs [employer] (From admission, onward)     Start     Ordered   Signed and Armed forces training and education officer morning,   R        Signed and Held   Signed and Held  CBC  Tomorrow morning,   R        Signed and Held   Signed and Held  Hemoglobin and hematocrit, blood  Once,   R        Signed and Held            Vitals/Pain Today's Vitals   09/30/23 0941 09/30/23 0950 09/30/23 1022 09/30/23 1130  BP:  (!) 88/65  102/76  Pulse:   80  82  Resp:  (!) 23  20  Temp:   98.6 F (37 C)   TempSrc:    Rectal   SpO2:    98%  Weight: 203 lb (92.1 kg)     PainSc:        Isolation Precautions No active isolations  Medications Medications  cefTRIAXone (ROCEPHIN) 2 g in sodium chloride 0.9 % 100 mL IVPB (2 g Intravenous New Bag/Given 09/30/23 1224)  azithromycin (ZITHROMAX) 500 mg in sodium chloride 0.9 % 250 mL IVPB (has no administration in time range)  lactated ringers bolus 1,000 mL (0 mLs Intravenous Stopped 09/30/23 1149)  naloxone (NARCAN) injection 0.4 mg (0.4 mg Intravenous Given 09/30/23 1013)  pantoprazole (PROTONIX) injection 40 mg (40 mg Intravenous Given 09/30/23 1029)  lactated ringers bolus 1,000 mL (0 mLs Intravenous Stopped 09/30/23 1225)    Mobility walks with person assist     Focused Assessments Cardiac Assessment Handoff:  Cardiac Rhythm: Normal sinus rhythm Lab Results  Component Value Date   TROPONINI <0.03 01/24/2019   Lab Results  Component Value Date   DDIMER 0.55 (H) 04/17/2019   Does the Patient currently have chest pain? No    R Recommendations: See Admitting Provider Note  Report given to:   Additional Notes: Pt currently ambulates with 1 person assist due to weakness and lethargy. No assist or devices at baseline. Pt mental status and lethargy is improving. BP trending upwards in response to fluids. 18ga LAC. Continent, Aox4.

## 2023-09-30 NOTE — Assessment & Plan Note (Signed)
AKI, hypovolemic hyponatremia.   Plan to continue volume resuscitation with isotonic saline at 100 ml per hr Follow up renal function in am, avoid hypotension and nephrotoxic medications.

## 2023-09-30 NOTE — Assessment & Plan Note (Signed)
Continue with risperidone, and trazodone

## 2023-09-30 NOTE — Progress Notes (Signed)
Pharmacy Antibiotic Note  Matthew Molina is a 72 y.o. male admitted on 09/30/2023 with aspiration pneumonia.  Pharmacy has been consulted for unasyn dosing.  Plan: Unasyn 3000 mg IV every 12 hours.  Weight: 92.1 kg (203 lb)  Temp (24hrs), Avg:98.5 F (36.9 C), Min:98.4 F (36.9 C), Max:98.6 F (37 C)  Recent Labs  Lab 09/30/23 0954 09/30/23 1135  WBC 19.7*  --   CREATININE 2.97*  --   LATICACIDVEN 2.3* 2.2*    Estimated Creatinine Clearance: 25 mL/min (A) (by C-G formula based on SCr of 2.97 mg/dL (H)).    Allergies  Allergen Reactions   Sertraline Hcl     Stomach upset/pain   Wellbutrin [Bupropion] Other (See Comments)    Makes stomach hurt    Antimicrobials this admission: Unasyn 10/13 >> CTX/azith 10/13  Microbiology results: None pending  Thank you for allowing pharmacy to be a part of this patient's care.  Judeth Cornfield, PharmD Clinical Pharmacist 09/30/2023 2:42 PM

## 2023-09-30 NOTE — Assessment & Plan Note (Addendum)
Uncontrolled hyperglycemia.   Holds on oral antihyperglycemic agents.  Continue glucose cover and monitoring with insulin sliding scale.   Continue with fenofibrate and rosuvastatin.

## 2023-10-01 DIAGNOSIS — R1013 Epigastric pain: Secondary | ICD-10-CM

## 2023-10-01 DIAGNOSIS — R112 Nausea with vomiting, unspecified: Secondary | ICD-10-CM

## 2023-10-01 DIAGNOSIS — R5383 Other fatigue: Secondary | ICD-10-CM

## 2023-10-01 DIAGNOSIS — K2970 Gastritis, unspecified, without bleeding: Secondary | ICD-10-CM

## 2023-10-01 DIAGNOSIS — I2510 Atherosclerotic heart disease of native coronary artery without angina pectoris: Secondary | ICD-10-CM | POA: Diagnosis not present

## 2023-10-01 DIAGNOSIS — R531 Weakness: Secondary | ICD-10-CM

## 2023-10-01 DIAGNOSIS — J42 Unspecified chronic bronchitis: Secondary | ICD-10-CM

## 2023-10-01 DIAGNOSIS — K2980 Duodenitis without bleeding: Secondary | ICD-10-CM | POA: Diagnosis not present

## 2023-10-01 DIAGNOSIS — K219 Gastro-esophageal reflux disease without esophagitis: Secondary | ICD-10-CM

## 2023-10-01 DIAGNOSIS — J189 Pneumonia, unspecified organism: Secondary | ICD-10-CM | POA: Diagnosis not present

## 2023-10-01 DIAGNOSIS — N179 Acute kidney failure, unspecified: Secondary | ICD-10-CM

## 2023-10-01 DIAGNOSIS — K279 Peptic ulcer, site unspecified, unspecified as acute or chronic, without hemorrhage or perforation: Secondary | ICD-10-CM | POA: Diagnosis not present

## 2023-10-01 LAB — BASIC METABOLIC PANEL
Anion gap: 6 (ref 5–15)
BUN: 40 mg/dL — ABNORMAL HIGH (ref 8–23)
CO2: 24 mmol/L (ref 22–32)
Calcium: 9.4 mg/dL (ref 8.9–10.3)
Chloride: 101 mmol/L (ref 98–111)
Creatinine, Ser: 1.72 mg/dL — ABNORMAL HIGH (ref 0.61–1.24)
GFR, Estimated: 41 mL/min — ABNORMAL LOW (ref >60)
Glucose, Bld: 63 mg/dL — ABNORMAL LOW (ref 70–99)
Potassium: 3.3 mmol/L — ABNORMAL LOW (ref 3.5–5.1)
Sodium: 131 mmol/L — ABNORMAL LOW (ref 135–145)

## 2023-10-01 LAB — CBC
HCT: 31.2 % — ABNORMAL LOW (ref 39.0–52.0)
Hemoglobin: 9.9 g/dL — ABNORMAL LOW (ref 13.0–17.0)
MCH: 28.5 pg (ref 26.0–34.0)
MCHC: 31.7 g/dL (ref 30.0–36.0)
MCV: 89.9 fL (ref 80.0–100.0)
Platelets: 191 10*3/uL (ref 150–400)
RBC: 3.47 MIL/uL — ABNORMAL LOW (ref 4.22–5.81)
RDW: 14.8 % (ref 11.5–15.5)
WBC: 15.4 10*3/uL — ABNORMAL HIGH (ref 4.0–10.5)
nRBC: 0 % (ref 0.0–0.2)

## 2023-10-01 LAB — GLUCOSE, CAPILLARY: Glucose-Capillary: 77 mg/dL (ref 70–99)

## 2023-10-01 MED ORDER — POTASSIUM CHLORIDE 10 MEQ/100ML IV SOLN
10.0000 meq | INTRAVENOUS | Status: AC
  Administered 2023-10-01 (×3): 10 meq via INTRAVENOUS
  Filled 2023-10-01 (×3): qty 100

## 2023-10-01 MED ORDER — IPRATROPIUM-ALBUTEROL 0.5-2.5 (3) MG/3ML IN SOLN
3.0000 mL | Freq: Four times a day (QID) | RESPIRATORY_TRACT | Status: DC | PRN
  Administered 2023-10-01 (×2): 3 mL via RESPIRATORY_TRACT
  Filled 2023-10-01 (×2): qty 3

## 2023-10-01 MED ORDER — INSULIN ASPART 100 UNIT/ML IJ SOLN
0.0000 [IU] | Freq: Every day | INTRAMUSCULAR | Status: DC
  Administered 2023-10-02: 2 [IU] via SUBCUTANEOUS

## 2023-10-01 MED ORDER — INSULIN ASPART 100 UNIT/ML IJ SOLN
0.0000 [IU] | Freq: Three times a day (TID) | INTRAMUSCULAR | Status: DC
  Administered 2023-10-03: 1 [IU] via SUBCUTANEOUS
  Administered 2023-10-03: 2 [IU] via SUBCUTANEOUS

## 2023-10-01 MED ORDER — IPRATROPIUM-ALBUTEROL 0.5-2.5 (3) MG/3ML IN SOLN
3.0000 mL | Freq: Three times a day (TID) | RESPIRATORY_TRACT | Status: DC
  Administered 2023-10-01 – 2023-10-03 (×5): 3 mL via RESPIRATORY_TRACT
  Filled 2023-10-01 (×6): qty 3

## 2023-10-01 MED ORDER — SODIUM CHLORIDE 0.9 % IV SOLN
3.0000 g | Freq: Four times a day (QID) | INTRAVENOUS | Status: DC
  Administered 2023-10-01 – 2023-10-03 (×9): 3 g via INTRAVENOUS
  Filled 2023-10-01 (×9): qty 8

## 2023-10-01 NOTE — Progress Notes (Signed)
Mobility Specialist Progress Note:    10/01/23 1445  Mobility  Activity Ambulated with assistance in room  Level of Assistance Moderate assist, patient does 50-74%  Assistive Device Front wheel walker  Distance Ambulated (ft) 25 ft  Range of Motion/Exercises Active;All extremities  Activity Response Tolerated well  Mobility Referral Yes  $Mobility charge 1 Mobility  Mobility Specialist Start Time (ACUTE ONLY) 1445  Mobility Specialist Stop Time (ACUTE ONLY) 1500  Mobility Specialist Time Calculation (min) (ACUTE ONLY) 15 min   Pt received in bed, agreeable to mobility. Required ModA to stand and MinA to ambulate with RW. Tolerated well, pt is very weak and unsteady during ambulation. Returned pt to bed, left supine. Alarm on, call bell in reach. All needs met.   Lawerance Bach Mobility Specialist Please contact via Special educational needs teacher or  Rehab office at [phone]

## 2023-10-01 NOTE — Plan of Care (Signed)
  Problem: Nutrition: Goal: Adequate nutrition will be maintained Outcome: Progressing   

## 2023-10-01 NOTE — Care Management Important Message (Signed)
Important Message  Patient Details  Name: Matthew Molina MRN: [id number] Date of Birth: 04-Aug-1951   Important Message Given:  N/A - LOS <3 / Initial given by admissions     Corey Harold 10/01/2023, 1:44 PM

## 2023-10-01 NOTE — Progress Notes (Signed)
Progress Note   Patient: Matthew Molina HYQ:[id number] DOB: 05-22-1951 DOA: 09/30/2023     1 DOS: the patient was seen and examined on 10/01/2023   Brief hospital admission course: As per H&P written by Dr. Ella Jubilee on 09/30/2023 Matthew Molina is a 72 y.o. male with medical history significant of peptic ulcer disease, COPD, T2DM, hypertension, dyslipidemia, ischemic colitis, and schizophrenia who presented with altered mental status.    Patient reports having nausea and vomiting for the last 2 to 3 days, associated with poor oral intake, and generalized weakness.  He has been vomiting stomach content, and he denies hematemesis. Positive abdominal pain generalized in location, constant and dull in nature, moderate in intensity with no improving or worsening factors. No diarrhea or constipation. His last bowel movement was 3 days ago.   10/08 he was evaluated at the gastroenterology outpatient office, for upper abdominal pain and nausea. Looks like has chronic abdominal pain. He was worked up with CT of the abdomen which showed moderate wall thickening and adjacent soft tissue stranding involving the descending duodenum representing duodenitis vs peptic ulcer disease. He was prescribed sucralfate and his proton pump inhibitor was increased from daily to bid. Noted patient recently quit alcohol consumption.   Today EMS was called because patient was altered, speaking slowly and having delayed responses. He was found hypotensive and lethargic, blood pressure 70/48. He was transported to the ED.   Assessment and Plan: *Severe sepsis secondary to right middle lobe pneumonia -Community acquired pneumonia, aspiration pneumonia, (present on admission). Complicated with severe sepsis, (end organ failure AKI, acute metabolic encephalopathy).  -Patient with underlying schizophrenia at baseline -Continue current IV biotics -Follow clinical response. -Renal function improving/stabilizing and trending down  back to baseline -Adequately following commands and appears to be demonstrated improvement in his encephalopathic process. -Good saturation on room air.  Peptic ulcer disease -Service has been consulted, follow recommendation for endoscopic evaluation -Continue PPI.  Chronic kidney disease, stage 3b (HCC) AKI, hypovolemic hyponatremia.  -Fluid resuscitation has been provided -Continue to follow electrolytes and renal function trend. -Adequate oral intake discussed with patient.  Hypertension Continue holding antihypertensive agents at the moment -Vital signs better and more stable currently.Marland Kitchen  COPD (chronic obstructive pulmonary disease) (HCC) -No signs of acute exacerbation. -Continue home bronchodilator management.  Coronary atherosclerosis of native coronary artery -No chest pain, no palpitations and no telemetry abnormalities suggesting acute ischemic process. -Continue supportive care and follow clinical response.   GERD (gastroesophageal reflux disease) -Continue PPI.  Type 2 diabetes mellitus with hyperlipidemia (HCC) -Uncontrolled hyperglycemia.  -Continue sliding scale insulin and follow CBGs repletion -Continue statin.  Schizophrenia, unspecified type (HCC) -Continue with risperidone, and trazodone   Subjective:  No suicidal ideation hallucination.  Denies chest pain, no nausea or vomiting at the moment.  Patient is not requiring oxygen supplementation.  Reports decreased appetite.  Intermittent coughing spells has been reported.  Physical Exam: Vitals:   10/01/23 0928 10/01/23 0944 10/01/23 1211 10/01/23 1351  BP:  100/69 111/73   Pulse:  95 99   Resp:      Temp:  98 F (36.7 C) 98.4 F (36.9 C)   TempSrc:  Oral Oral   SpO2: 96% 98% 96% 97%  Weight:       General exam: Alert, awake, following commands appropriately; demonstrating no agitation, no fever, no chest pain, no nausea or vomiting currently.  Decrease appetite reported. Respiratory system: No  using accessory muscle.  Positive rhonchi bilaterally appreciated.  Good saturation  on room air. Cardiovascular system: Rate controlled, no rubs or gallops. Gastrointestinal system: Abdomen is obese, nondistended, soft and nontender.  Positive bowel sounds. Central nervous system: Moving 4 limbs spontaneously.  No focal neurological deficits. Extremities: No cyanosis or clubbing. Skin: No petechiae. Psychiatry: Judgement and insight appear stable currently; flat affect appreciated.  Data Reviewed: Basic metabolic panel: Sodium 131, potassium 3.3, chloride 101, bicarb 24, BUN 40, creatinine 1.72 and GFR 41 CBC: WBCs 15.4, hemoglobin 9.9 and platelet count 191K.  Family Communication: No family at bedside.  Disposition: Status is: Inpatient Remains inpatient appropriate because: Continue IV antibiotics.   Planned Discharge Destination: Home  Time spent: 45 minutes  Author: Vassie Loll, MD 10/01/2023 6:46 PM  For on call review [email or web address].

## 2023-10-01 NOTE — Progress Notes (Signed)
Nurse at bedside,patient alert and oriented times four,to person,place,time,and situation. Patient very weak,malaise this am. Upon assessment left arm swollen, IV infiltrated.IV discontinued,catheter intact. New IV restarted,22 gauge to right upper arm,patient tolerated procedure. Blood pressure 100/69 this am,Dr Gwenlyn Perking notified. Plan of care on going.

## 2023-10-01 NOTE — Inpatient Diabetes Management (Signed)
Inpatient Diabetes Program Recommendations  AACE/ADA: New Consensus Statement on Inpatient Glycemic Control (2015)  Target Ranges:  Prepandial:   less than 140 mg/dL      Peak postprandial:   less than 180 mg/dL (1-2 hours)      Critically ill patients:  140 - 180 mg/dL   Lab Results  Component Value Date   GLUCAP 211 (H) 09/30/2023   HGBA1C 7.1 (H) 04/27/2023    Review of Glycemic Control  Latest Reference Range & Units 09/30/23 10:17  Glucose-Capillary 70 - 99 mg/dL 409 (H)    Latest Reference Range & Units 09/30/23 09:54 10/01/23 04:50  Glucose 70 - 99 mg/dL 811 (H) 63 (L)   Diabetes history: DM 2 Outpatient Diabetes medications:  Farxiga 5 mg daily Glucotrol 10 mg with breakfast (patient takes 2 pills) Current orders for Inpatient glycemic control:  None Inpatient Diabetes Program Recommendations:    Please consider adding Novolog "very sensitive" correction (0-6 units) tid with meals.   Thanks,  Beryl Meager, RN, BC-ADM Inpatient Diabetes Coordinator Pager [phone] (8a-5p)

## 2023-10-01 NOTE — Progress Notes (Signed)
Lab called, patient Creatinine went from 2.97 -> 1.72 and his BUN 54 -> 40. MD Zierle-Ghosh made aware.

## 2023-10-01 NOTE — TOC Initial Note (Signed)
Transition of Care [facility]) - Initial/Assessment Note    Patient Details  Name: Matthew Molina MRN: [id number] Date of Birth: [birth year]  [facility] CM/SW Contact:    Villa Herb, LCSWA Phone Number: 10/01/2023, 2:58 PM  Clinical Narrative:                 Pt is high risk for readmission. CSW spoke with pt to complete assessment. Pt states he lives alone. CSW also spoke with pts friend/neighbor [name] who states pt has a nurses aide Monday though Saturday. [name] states friends and family provide transportation when needed. Pt has a cane and walker to use when needed. [name] states pt has assistance that is needed in home and when aides are not available friends and family assist. TOC to follow.   Expected Discharge Plan: Home/Self Care Barriers to Discharge: Continued Medical Work up   Patient Goals and CMS Choice Patient states their goals for this hospitalization and ongoing recovery are:: return home CMS Medicare.gov Compare Post Acute Care list provided to:: Patient Choice offered to / list presented to : Patient      Expected Discharge Plan and Services In-house Referral: Clinical Social Work Discharge Planning Services: CM Consult Post Acute Care Choice: Resumption of Svcs/PTA Provider Living arrangements for the past 2 months: Single Family Home                                      Prior Living Arrangements/Services Living arrangements for the past 2 months: Single Family Home Lives with:: Self Patient language and need for interpreter reviewed:: Yes Do you feel safe going back to the place where you live?: Yes      Need for Family Participation in Patient Care: Yes (Comment) Care giver support system in place?: Yes (comment) Current home services: DME Criminal Activity/Legal Involvement Pertinent to Current Situation/Hospitalization: No - Comment as needed  Activities of Daily Living      Permission Sought/Granted                   Emotional Assessment Appearance:: Appears stated age Attitude/Demeanor/Rapport: Engaged Affect (typically observed): Accepting Orientation: : Oriented to Self, Oriented to Place, Oriented to  Time, Oriented to Situation Alcohol / Substance Use: Not Applicable Psych Involvement: No (comment)  Admission diagnosis:  Dehydration [E86.0] Upper GI bleeding [K92.2] AKI (acute kidney injury) (HCC) [N17.9] Aspiration pneumonia, unspecified aspiration pneumonia type, unspecified laterality, unspecified part of lung (HCC) [J69.0] Patient Active Problem List   Diagnosis Date Noted   Peptic ulcer disease 09/30/2023   Right middle lobe pneumonia 09/30/2023   Duodenitis 09/18/2023   Type 2 diabetes mellitus with hyperlipidemia (HCC) 08/09/2023   Chronic pancreatitis (HCC) 06/30/2023   Weight loss, non-intentional 06/30/2023   Acute pancreatitis 04/30/2023   Encounter for support and coordination of transition of care 12/17/2022   COPD exacerbation (HCC) 12/08/2022   Influenza A 12/08/2022   Encounter for Medicare annual examination with abnormal findings 09/19/2022   Hip pain, right 09/19/2022   Shoulder pain, left 08/09/2022   Arm pain 01/27/2022   Multiple environmental allergies 01/02/2022   Pes planus 08/03/2021   Abnormal LFTs 06/28/2021   Benign prostatic hyperplasia with urinary obstruction 10/11/2020   Emphysema lung (HCC) 09/19/2020   Tubular adenoma of colon 09/13/2020   Incomplete emptying of bladder 09/08/2020   Musculoskeletal leg pain, right 08/11/2020   Hip pain, bilateral 08/11/2020  Hemorrhoids 03/18/2020   Muscle pain 01/22/2020   QT prolongation 01/14/2020   Chronic respiratory failure with hypoxia (HCC) 01/06/2020   Diverticulosis of colon    Urinary incontinence 10/08/2019   Unsteady gait 04/18/2017   Elevated PSA 10/01/2016   GERD (gastroesophageal reflux disease) 08/24/2016   Chronic kidney disease, stage 3b (HCC) 05/22/2016   COPD (chronic obstructive  pulmonary disease) (HCC) 11/05/2015   Hypertension 11/05/2015   Tobacco abuse 07/24/2015   Coronary atherosclerosis of native coronary artery 04/02/2015   Multiple lung nodules on CT 04/02/2015   GAD (generalized anxiety disorder) 07/04/2012   Back pain with radiation 11/01/2011   Hyperlipidemia 06/02/2008   Obesity (BMI 30.0-34.9) 06/02/2008   Schizophrenia, unspecified type (HCC) 06/02/2008   PCP:  Kerri Perches, MD Pharmacy:   [facility] - [place], [place] - [address] [place] [address] Phone: [phone] Fax: [phone]     Social Determinants of Health (SDOH) Social History: SDOH Screenings   Food Insecurity: No Food Insecurity (11/27/2022)  Housing: Low Risk  (12/13/2022)  Transportation Needs: No Transportation Needs (12/13/2022)  Utilities: Not At Risk (11/27/2022)  Alcohol Screen: Low Risk  (11/27/2022)  Depression (PHQ2-9): Low Risk  (09/18/2023)  Recent Concern: Depression (PHQ2-9) - High Risk (08/02/2023)  Financial Resource Strain: Low Risk  (12/13/2022)  Physical Activity: Inactive (11/27/2022)  Social Connections: Moderately Isolated (11/27/2022)  Stress: No Stress Concern Present (11/27/2022)  Tobacco Use: High Risk (09/30/2023)   SDOH Interventions:     Readmission Risk Interventions    10/01/2023    2:57 PM 10/01/2023    2:06 PM  Readmission Risk Prevention Plan  Transportation Screening Complete Complete  HRI or Home Care Consult Complete Complete  Social Work Consult for Recovery Care Planning/Counseling Complete Complete  Palliative Care Screening Not Applicable Not Applicable  Medication Review Oceanographer) Complete Complete

## 2023-10-01 NOTE — Consult Note (Signed)
Gastroenterology Consult   Referring Provider: Dr. Gwenlyn Perking  Primary Care Physician:  Kerri Perches, MD Primary Gastroenterologist:  Dr. Marletta Lor  Patient ID: Matthew Molina; [id number]; September 17, 1951   Admit date: 09/30/2023  LOS: 1 day   Date of Consultation: 10/01/2023  Reason for Consultation:  nausea and vomiting, anemia  History of Present Illness   Matthew Molina is a 72 y.o. year old male with a history of COPD, diabetes, ischemic colitis in 2021, HLD, HTN, depression, chronic epigastric pain, constipation, prior imaging on CT scans with esophageal wall thickening dating back to March 2024 , duodenitis seen on prior imaging and current, presenting with worsening acute on chronic abdominal pain, nausea, and vomiting. Admitted with sepsis in setting of pneumonia, with GI consulted due to nausea, vomiting, epigastric pain and declining hemoglobin,.    In the ED: Hgb 10.1 (down from 14.7 several weeks prior), this morning 9.9, WBC count initially 19.7, now improved to 15.4, creatinine 2.97, sodium 126. CT chest/abd/pelvis without contrast as below with aspiration pneumonia in setting of gastric outlet obstruction secondary to duodenal inflammation, unchanged prominent wall thickening/inflammation of descending duodenum, unchanged circumferential wall thickening of distal esophagus, unchanged 4.3 cm ascending thoractic aortic aneurysm.   Breathing treatment this morning. Feels nauseated. Lots of mucus. 60% of clear liquids (broth, jello, ginger ale). No dysphagia per sitter. He is weak this morning. Sleeping. Does not engage in conversation easily. Difficult historian. Sitter states he was anxious yesterday but has been sleeping majority of the day. Unable to obtain meaningful history.       EGD 05/28/2023: -Variable Z-line, s/p biopsy - Gastritis s/p biopsy - Small hiatal hernia - Normal duodenum - PPI changes noted in the stomach, negative for H. pylori.  GE junction biopsy  with mild chronic carditis and hyperplastic change   EGD March 2017: -Esophageal stricture s/p dilation -Gastritis and duodenitis -Biopsies positive for H. Pylori   Initially treated for H. pylori with Prevpac and then subsequently Pylera.  Has had documented eradication.   Colonoscopy April 2021: -7 colon polyps -Pancolonic diverticulosis -External and internal hemorrhoids -Pathology revealed tubular adenomas -Recommend repeat colonoscopy in 3 years  Past Medical History:  Diagnosis Date   Acute blood loss anemia 01/05/2020   Acute respiratory failure with hypoxia (HCC)    Arthritis    COPD (chronic obstructive pulmonary disease) (HCC)    Depression    Diabetes mellitus    Diabetes mellitus without complication (HCC)    Diverticulitis    Elevated PSA 10/01/2016   Encounter for support and coordination of transition of care 01/14/2020   Hypercholesterolemia    Hyperlipidemia    Hypertension    Insomnia 01/05/2016   Ischemic colitis (HCC) 01/05/2020   Multiple lung nodules on CT 04/02/2015   Nicotine addiction    Obesity    Oxygen deficiency    qhs   Schizophrenia (HCC)     Past Surgical History:  Procedure Laterality Date   BIOPSY  05/28/2023   Procedure: BIOPSY;  Surgeon: Lanelle Bal, DO;  [facility]: [facility];  Service: Endoscopy;;   CATARACT EXTRACTION W/PHACO Left 11/20/2013   Procedure: CATARACT EXTRACTION PHACO AND INTRAOCULAR LENS PLACEMENT (IOC);  Surgeon: Gemma Payor, MD;  Location: [facility];  Service: Ophthalmology;  Laterality: Left;  CDE:10.26   CATARACT EXTRACTION W/PHACO Right 12/08/2013   Procedure: RIGHT EYE CATARACT EXTRACTION PHACO AND INTRAOCULAR LENS PLACEMENT ;  Surgeon: Gemma Payor, MD;  Location: [facility];  Service: Ophthalmology;  Laterality: Right;  CDE 12.38   COLONOSCOPY N/A 04/01/2013   pancolonic diverticulosis, redundant colon, large internal hemorrhoids.    COLONOSCOPY  2014   INCOMPLETE PREP IN R COLON   COLONOSCOPY N/A  04/01/2020   Procedure: COLONOSCOPY;  Surgeon: West Bali, MD;  Location: [facility];  Service: Endoscopy;  Laterality: N/A;  8:45am   ESOPHAGOGASTRODUODENOSCOPY N/A 02/18/2016   stricture at GE junction, moderate erosive gastritis and mild non-erosive duodenitis. +H.pylori. Treated initially with Prevpac. Breath test to check for eradication was positive, and he was prescribed Pylera.    ESOPHAGOGASTRODUODENOSCOPY (EGD) WITH PROPOFOL N/A 05/28/2023   Procedure: ESOPHAGOGASTRODUODENOSCOPY (EGD) WITH PROPOFOL;  Surgeon: Lanelle Bal, DO;  [facility]: [facility];  Service: Endoscopy;  Laterality: N/A;  11:15am, asa 3   EYE SURGERY Left 11/2013   cataract extraction   POLYPECTOMY  04/01/2020   Procedure: POLYPECTOMY;  Surgeon: West Bali, MD;  Location: [facility];  Service: Endoscopy;;   SAVORY DILATION N/A 02/18/2016   Procedure: SAVORY DILATION;  Surgeon: West Bali, MD;  Location: [facility];  Service: Endoscopy;  Laterality: N/A;   SHOULDER SURGERY Right     Prior to Admission medications   Medication Sig Start Date End Date Taking? Authorizing Provider  acetaminophen (TYLENOL) 500 MG tablet Take 1,000 mg by mouth 2 (two) times daily as needed for headache, moderate pain or fever.   Yes [provider]  albuterol (VENTOLIN HFA) 108 (90 Base) MCG/ACT inhaler INHALE 2 PUFFS INTO THE LUNGS EVERY 6 HOURS AS NEEDED FOR WHEEZING OR SHORTNESS OF BREATH. 09/21/23  Yes Kerri Perches, MD  busPIRone (BUSPAR) 7.5 MG tablet Take 7.5 mg by mouth 3 (three) times daily.   Yes [provider]  carvedilol (COREG) 6.25 MG tablet Take 6.25 mg by mouth 2 (two) times daily with a meal.   Yes [provider]  esomeprazole (NEXIUM) 40 MG capsule Take 1 capsule (40 mg total) by mouth 2 (two) times daily before a meal. 09/25/23  Yes Mahon, Courtney L, NP  FARXIGA 5 MG TABS tablet Take 5 mg by mouth daily. 07/27/22  Yes [provider]  fenofibrate  (TRICOR) 145 MG tablet Take 1 tablet (145 mg total) by mouth daily. 09/17/23  Yes Kerri Perches, MD  glipiZIDE (GLUCOTROL XL) 10 MG 24 hr tablet TAKE (2) TABLETS BY MOUTH DAILY EVERY MORNING AT BREAKFAST. 08/09/23  Yes Kerri Perches, MD  ipratropium-albuterol (DUONEB) 0.5-2.5 (3) MG/3ML SOLN INHALE 1 VIAL VIA NEBULIZER EVERY 4 TO 6 HOURS. Patient taking differently: Take 3 mLs by nebulization 2 (two) times daily. 06/27/23  Yes Kerri Perches, MD  linaclotide [facility]) 145 MCG CAPS capsule Take 1 capsule (145 mcg total) by mouth daily before breakfast. 08/08/23  Yes Mahon, Frederik Schmidt, NP  meloxicam (MOBIC) 15 MG tablet Take 1 tablet (15 mg total) by mouth daily. Patient taking differently: Take 15 mg by mouth daily as needed for pain. 09/18/23  Yes Del Newman Nip, Tenna Child, FNP  montelukast (SINGULAIR) 10 MG tablet TAKE (1) TABLET BY MOUTH AT BEDTIME. 08/21/23  Yes Kerri Perches, MD  Multiple Vitamins-Minerals (MULTIVITAMIN WITH MINERALS) tablet Take 1 tablet by mouth daily.   Yes [provider]  olmesartan (BENICAR) 40 MG tablet TAKE ONE TABLET BY MOUTH DAILY 09/14/23  Yes Kerri Perches, MD  ondansetron (ZOFRAN-ODT) 4 MG disintegrating tablet DISSOLVE 1 TABLET ON TONGUE ONCE DAILY AS NEEDED. 09/13/23  Yes Aida Raider, NP  risperiDONE (RISPERDAL) 1  MG tablet Take 1 tablet (1 mg total) by mouth at bedtime. 09/26/23  Yes Kerri Perches, MD  risperidone (RISPERDAL) 4 MG tablet TAKE ONE TABLET BY MOUTH IN THE EVENING. TAKE WITH 1MG  TABLET. Patient taking differently: Take 4 mg by mouth at bedtime. 07/20/23  Yes Kerri Perches, MD  sucralfate (CARAFATE) 1 GM/10ML suspension Take 10 mLs (1 g total) by mouth 4 (four) times daily -  with meals and at bedtime. 09/25/23  Yes Mahon, Frederik Schmidt, NP  SYMBICORT 160-4.5 MCG/ACT inhaler INHALE 2 PUFFS INTO THE LUNGS ONCE IN THE MORNING AND AT BEDTIME. 09/14/23  Yes Kerri Perches, MD  tamsulosin (FLOMAX) 0.4 MG CAPS  capsule Take 1 capsule (0.4 mg total) by mouth daily after supper. 09/12/23  Yes McKenzie, Mardene Celeste, MD  traZODone (DESYREL) 50 MG tablet TAKE ONE TABLET BY MOUTH AT BEDTIME AS NEEDED FOR SLEEP. Patient taking differently: Take 50 mg by mouth at bedtime. 07/20/23  Yes Kerri Perches, MD  triamterene-hydrochlorothiazide (MAXZIDE-25) 37.5-25 MG tablet TAKE (1/2) TABLET BY MOUTH ONCE DAILY. 08/21/23  Yes Kerri Perches, MD  ACCU-CHEK GUIDE test strip USE TO CHECK BLOOD SUGAR ONCE DAILY 07/31/23   Kerri Perches, MD  Accu-Chek Softclix Lancets lancets TEST BLOOD SUGAR ONCE DAILY AS DIRECTED. 07/07/19   Kerri Perches, MD  sildenafil (VIAGRA) 100 MG tablet Take 100 mg by mouth. Take one tablet 30 mins before intercourse   03/11/12  [provider]    Current Facility-Administered Medications  Medication Dose Route Frequency Provider Last Rate Last Admin   0.9 %  sodium chloride infusion   Intravenous Continuous Arrien, York Ram, MD 100 mL/hr at 10/01/23 0605 New Bag at 10/01/23 0605   acetaminophen (TYLENOL) tablet 650 mg  650 mg Oral Q6H PRN Arrien, York Ram, MD       Or   acetaminophen (TYLENOL) suppository 650 mg  650 mg Rectal Q6H PRN Arrien, York Ram, MD       acetaminophen (TYLENOL) tablet 500-1,000 mg  500-1,000 mg Oral Q6H PRN Arrien, York Ram, MD       Ampicillin-Sulbactam (UNASYN) 3 g in sodium chloride 0.9 % 100 mL IVPB  3 g Intravenous Q12H Arrien, York Ram, MD 200 mL/hr at 10/01/23 0507 3 g at 10/01/23 0507   busPIRone (BUSPAR) tablet 10 mg  10 mg Oral TID Coralie Keens, MD   10 mg at 09/30/23 2124   fenofibrate tablet 54 mg  54 mg Oral Daily Arrien, York Ram, MD       guaiFENesin-dextromethorphan [facility] DM) 100-10 MG/5ML syrup 5 mL  5 mL Oral Q4H PRN Arrien, York Ram, MD   5 mL at 10/01/23 0504   multivitamin with minerals tablet 1 tablet  1 tablet Oral Daily Arrien, York Ram, MD   1 tablet at  09/30/23 1606   ondansetron (ZOFRAN) tablet 4 mg  4 mg Oral Q6H PRN Arrien, York Ram, MD       Or   ondansetron [facility]) injection 4 mg  4 mg Intravenous Q6H PRN Arrien, York Ram, MD       pantoprazole (PROTONIX) injection 40 mg  40 mg Intravenous Q12H Coralie Keens, MD   40 mg at 09/30/23 2124   risperiDONE (RISPERDAL) tablet 5 mg  5 mg Oral QHS Arrien, York Ram, MD   5 mg at 09/30/23 2123   rosuvastatin (CRESTOR) tablet 10 mg  10 mg Oral Daily Arrien, York Ram, MD   10 mg  at 09/30/23 1606   sucralfate (CARAFATE) 1 GM/10ML suspension 1 g  1 g Oral TID WC & HS Arrien, York Ram, MD   1 g at 09/30/23 2124   tamsulosin (FLOMAX) capsule 0.4 mg  0.4 mg Oral QPC supper Arrien, York Ram, MD   0.4 mg at 09/30/23 1606   traZODone (DESYREL) tablet 50 mg  50 mg Oral QHS PRN Arrien, York Ram, MD        Allergies as of 09/30/2023 - Review Complete 09/30/2023  Allergen Reaction Noted   Asa [aspirin] Other (See Comments) 09/30/2023   Crestor [rosuvastatin] Other (See Comments) 09/30/2023   Wellbutrin [bupropion] Other (See Comments) 07/29/2015   Zoloft [sertraline hcl] Other (See Comments) 11/05/2015    Family History  Problem Relation Age of Onset   Diabetes Mother    Hypertension Mother    Stroke Mother    Diabetes Sister    Heart disease Sister    Kidney disease Father    Drug abuse Brother    Colon cancer Neg Hx    Colon polyps Neg Hx     Social History   Socioeconomic History   Marital status: Divorced    Spouse name: Not on file   Number of children: 2   Years of education: Not on file   Highest education level: Not on file  Occupational History   Occupation: retired from Theatre stage manager   Tobacco Use   Smoking status: Every Day    Current packs/day: 1.00    Average packs/day: 1 pack/day for 48.0 years (48.0 ttl pk-yrs)    Types: Cigarettes   Smokeless tobacco: Never  Vaping Use   Vaping status: Never Used  Substance and  Sexual Activity   Alcohol use: Yes    Alcohol/week: 0.0 standard drinks of alcohol    Comment: "little bit of beer"   Drug use: No   Sexual activity: Not Currently    Birth control/protection: None  Other Topics Concern   Not on file  Social History Narrative   ** Merged History Encounter **       ** Merged History Encounter **       Social Determinants of Health   Financial Resource Strain: Low Risk  (12/13/2022)   Overall Financial Resource Strain (CARDIA)    Difficulty of Paying Living Expenses: Not very hard  Food Insecurity: No Food Insecurity (11/27/2022)   Hunger Vital Sign    Worried About Running Out of Food in the Last Year: Never true    Ran Out of Food in the Last Year: Never true  Transportation Needs: No Transportation Needs (12/13/2022)   PRAPARE - Administrator, Civil Service (Medical): No    Lack of Transportation (Non-Medical): No  Physical Activity: Inactive (11/27/2022)   Exercise Vital Sign    Days of Exercise per Week: 0 days    Minutes of Exercise per Session: 0 min  Stress: No Stress Concern Present (11/27/2022)   [employer] of Occupational Health - Occupational Stress Questionnaire    Feeling of Stress : Not at all  Social Connections: Moderately Isolated (11/27/2022)   Social Connection and Isolation Panel [NHANES]    Frequency of Communication with Friends and Family: More than three times a week    Frequency of Social Gatherings with Friends and Family: More than three times a week    Attends Religious Services: More than 4 times per year    Active Member of Clubs or Organizations: No    Attends  Club or Organization Meetings: Never    Marital Status: Divorced  Catering manager Violence: Not At Risk (11/27/2022)   Humiliation, Afraid, Rape, and Kick questionnaire    Fear of Current or Ex-Partner: No    Emotionally Abused: No    Physically Abused: No    Sexually Abused: No     Review of Systems   Limited due to  cognitive status  Physical Exam   Vital Signs in last 24 hours: Temp:  [98.4 F (36.9 C)-98.6 F (37 C)] 98.5 F (36.9 C) (10/14 0500) Pulse Rate:  [80-100] 100 (10/14 0500) Resp:  [16-31] 20 (10/13 2212) BP: (88-117)/(59-80) 95/66 (10/14 0500) SpO2:  [94 %-98 %] 95 % (10/14 0500) Weight:  [92.1 kg] 92.1 kg (10/13 0941) Last BM Date : 09/27/23  General:   sleeping, no distress, chronically ill-appearing Head:  Normocephalic and atraumatic. Eyes:  Sclera clear, no icterus.    Ears:  Normal auditory acuity. Mouth:  edentulous Lungs:  scattered rhonchi Heart:  S1 S2 present without murmurs Abdomen:  Soft, normal BS, tender to palpation epigastric Rectal: deferred   Msk:  Symmetrical without gross deformities. Normal posture. Extremities:  Without edema. Neurologic:  oriented to person and place Skin:  Intact without significant lesions or rashes. Psych:  Flat affect  Intake/Output from previous day: 10/13 0701 - 10/14 0700 In: 3819.3 [P.O.:120; I.V.:1528.3; IV Piggyback:2171] Out: 400 [Urine:400] Intake/Output this shift: No intake/output data recorded.    Labs/Studies   Recent Labs Recent Labs    09/30/23 0954 10/01/23 0450  WBC 19.7* 15.4*  HGB 10.1* 9.9*  HCT 31.4* 31.2*  PLT 230 191   BMET Recent Labs    09/30/23 0954 10/01/23 0450  NA 126* 131*  K 3.9 3.3*  CL 93* 101  CO2 22 24  GLUCOSE 239* 63*  BUN 54* 40*  CREATININE 2.97* 1.72*  CALCIUM 9.3 9.4   LFT Recent Labs    09/30/23 0954  PROT 6.0*  ALBUMIN 3.2*  AST 29  ALT 11  ALKPHOS 24*  BILITOT 0.9     Radiology/Studies CT CHEST ABDOMEN PELVIS WO CONTRAST  Result Date: 09/30/2023 CLINICAL DATA:  Altered mental status and lethargy.  Sepsis. EXAM: CT CHEST, ABDOMEN AND PELVIS WITHOUT CONTRAST TECHNIQUE: Multidetector CT imaging of the chest, abdomen and pelvis was performed following the standard protocol without IV contrast. RADIATION DOSE REDUCTION: This exam was performed  according to the departmental dose-optimization program which includes automated exposure control, adjustment of the mA and/or kV according to patient size and/or use of iterative reconstruction technique. COMPARISON:  CT abdomen pelvis dated September 08, 2023. CT chest dated March 05, 2023. FINDINGS: CT CHEST FINDINGS Cardiovascular: No significant vascular findings. Normal heart size. No pericardial effusion. Unchanged mildly dilated ascending thoracic aorta measuring up 4.3 cm in diameter. Coronary, aortic arch, and branch vessel atherosclerotic vascular disease. Mediastinum/Nodes: No enlarged mediastinal, hilar, or axillary lymph nodes. Unchanged circumferential wall thickening of the distal esophagus. Normal thyroid gland. Lungs/Pleura: Small amount of debris in the trachea. New patchy tree-in-bud centrilobular micro nodularity in the right upper lobe and right middle lobe. Subsegmental atelectasis in the right middle lobe. Unchanged trace left pleural effusion. Mild paraseptal emphysema. No pneumothorax. Musculoskeletal: No acute or significant osseous findings. CT ABDOMEN PELVIS FINDINGS Hepatobiliary: No focal liver abnormality is seen. No gallstones, gallbladder wall thickening, or biliary dilatation. Pancreas: Mild inflammatory change in the region of the pancreatic head is likely related to descending duodenal inflammation as seen on the prior CT.  No ductal dilatation. Spleen: Normal in size without focal abnormality. Adrenals/Urinary Tract: Unchanged 1.2 cm right adrenal myelolipoma. No follow-up imaging is recommended. Unchanged nodular thickening of the left adrenal gland. Unchanged bilateral renal simple and hemorrhagic cysts. No follow-up imaging is recommended. No renal calculi or hydronephrosis. New small hyperdensity in the right posterolateral bladder. No bladder wall thickening. Stomach/Bowel: Unchanged small hiatal hernia. The stomach remains distended. Prominent wall thickening of the  descending duodenum is not significantly changed, although less well evaluated without intravenous contrast compared to the prior study. Remaining small bowel is unremarkable. Colonic diverticulosis. Normal appendix. Vascular/Lymphatic: Aortic atherosclerosis. No enlarged abdominal or pelvic lymph nodes. Reproductive: Unchanged prostatomegaly. Other: No free fluid or pneumoperitoneum. Musculoskeletal: No acute or significant osseous findings. IMPRESSION: 1. New patchy tree-in-bud centrilobular micronodularity in the right upper lobe and right middle lobe, consistent with aspiration pneumonia in the setting of gastric outlet obstruction from severe duodenal inflammation. 2. Unchanged prominent wall thickening and inflammation of the descending duodenum, most likely peptic ulcer disease or duodenitis. No extraluminal air or fluid collection. 3. New small hyperdensity in the right posterolateral bladder, potentially tiny layering bladder calculi. 4. Unchanged circumferential wall thickening of the distal esophagus, likely chronic esophagitis. 5. Unchanged 4.3 cm ascending thoracic aortic aneurysm. Recommend annual imaging followup by CTA or MRA. This recommendation follows 2010 ACCF/AHA/AATS/ACR/ASA/SCA/SCAI/SIR/STS/SVM Guidelines for the Diagnosis and Management of Patients with Thoracic Aortic Disease. Circulation. 2010; 121: W098-J191. Aortic aneurysm NOS (ICD10-I71.9) 6. Aortic Atherosclerosis (ICD10-I70.0) and Emphysema (ICD10-J43.9). Electronically Signed   By: Obie Dredge M.D.   On: 09/30/2023 11:45     Assessment   Matthew Molina is a 72 y.o. year old male with a history of COPD, diabetes, ischemic colitis in 2021, HLD, HTN, depression, chronic epigastric pain, constipation, prior imaging on CT scans with persistent esophageal wall thickening dating back to March 2024 , duodenitis seen on prior imaging and current, presenting with worsening acute on chronic abdominal pain, nausea, and vomiting.  Admitted with sepsis in setting of pneumonia, with GI consulted due to nausea, vomiting, epigastric pain and declining hemoglobin. CT chest/abd/pelvis without contrast concerning for gastric outlet obstruction in setting of duodenal inflammation unchanged from prior and distal esophageal wall thickening.  History extremely limited today due to patient's fatigue and weakness. He is not an endoscopic candidate today but will need EGD this admission for direct visualization.   Notably, Hgb 9.9 this morning, initially 10.1 on admission and previously 14.7. no overt GI bleeding that nursing staff is aware. EGD June 2024 with gastritis and normal duodenum. CTs reviewed dating back to March with abnormal duodenum, possible component of groove pancreatitis. I note he has lost 30 lbs over the past 6-7 months. Will plan on EGD when stabilized from respiratory standpoint. If EGD unrevealing, recommend outpatient dedicated imaging (MRI).       Plan / Recommendations    NPO after midnight to reassess readiness for EGD PPI BID Follow H/H Outpatient dedicated imaging of pancreas, especially if EGD is unrevealing Outpatient surveillance colonoscopy     10/01/2023, 8:42 AM  Gelene Mink, PhD, ANP-BC [facility]

## 2023-10-02 ENCOUNTER — Encounter (HOSPITAL_COMMUNITY): Payer: Self-pay | Admitting: Internal Medicine

## 2023-10-02 ENCOUNTER — Inpatient Hospital Stay (HOSPITAL_COMMUNITY): Payer: 59 | Admitting: Anesthesiology

## 2023-10-02 ENCOUNTER — Encounter (HOSPITAL_COMMUNITY)
Admission: EM | Disposition: A | Discharge status: Home / Self Care | Payer: Self-pay | Source: Home / Self Care | Attending: Internal Medicine

## 2023-10-02 ENCOUNTER — Telehealth: Payer: Self-pay | Admitting: Gastroenterology

## 2023-10-02 ENCOUNTER — Telehealth: Payer: Self-pay

## 2023-10-02 DIAGNOSIS — K209 Esophagitis, unspecified without bleeding: Secondary | ICD-10-CM | POA: Diagnosis not present

## 2023-10-02 DIAGNOSIS — K259 Gastric ulcer, unspecified as acute or chronic, without hemorrhage or perforation: Secondary | ICD-10-CM

## 2023-10-02 DIAGNOSIS — I2510 Atherosclerotic heart disease of native coronary artery without angina pectoris: Secondary | ICD-10-CM | POA: Diagnosis not present

## 2023-10-02 DIAGNOSIS — K219 Gastro-esophageal reflux disease without esophagitis: Secondary | ICD-10-CM | POA: Diagnosis not present

## 2023-10-02 DIAGNOSIS — F413 Other mixed anxiety disorders: Secondary | ICD-10-CM | POA: Diagnosis not present

## 2023-10-02 DIAGNOSIS — Q403 Congenital malformation of stomach, unspecified: Secondary | ICD-10-CM | POA: Diagnosis not present

## 2023-10-02 DIAGNOSIS — D509 Iron deficiency anemia, unspecified: Secondary | ICD-10-CM

## 2023-10-02 DIAGNOSIS — K2980 Duodenitis without bleeding: Secondary | ICD-10-CM | POA: Diagnosis not present

## 2023-10-02 DIAGNOSIS — R972 Elevated prostate specific antigen [PSA]: Secondary | ICD-10-CM

## 2023-10-02 DIAGNOSIS — K279 Peptic ulcer, site unspecified, unspecified as acute or chronic, without hemorrhage or perforation: Secondary | ICD-10-CM | POA: Diagnosis not present

## 2023-10-02 DIAGNOSIS — J189 Pneumonia, unspecified organism: Secondary | ICD-10-CM | POA: Diagnosis not present

## 2023-10-02 HISTORY — PX: BIOPSY: SHX5522

## 2023-10-02 HISTORY — PX: ESOPHAGOGASTRODUODENOSCOPY (EGD) WITH PROPOFOL: SHX5813

## 2023-10-02 LAB — CBC
HCT: 27.7 % — ABNORMAL LOW (ref 39.0–52.0)
Hemoglobin: 9 g/dL — ABNORMAL LOW (ref 13.0–17.0)
MCH: 29.5 pg (ref 26.0–34.0)
MCHC: 32.5 g/dL (ref 30.0–36.0)
MCV: 90.8 fL (ref 80.0–100.0)
Platelets: 176 10*3/uL (ref 150–400)
RBC: 3.05 MIL/uL — ABNORMAL LOW (ref 4.22–5.81)
RDW: 15.3 % (ref 11.5–15.5)
WBC: 8.9 10*3/uL (ref 4.0–10.5)
nRBC: 0 % (ref 0.0–0.2)

## 2023-10-02 LAB — FOLATE: Folate: 12.2 ng/mL (ref >5.9)

## 2023-10-02 LAB — IRON AND TIBC
Iron: 16 ug/dL — ABNORMAL LOW (ref 45–182)
Saturation Ratios: 7 % — ABNORMAL LOW (ref 17.9–39.5)
TIBC: 228 ug/dL — ABNORMAL LOW (ref 250–450)
UIBC: 212 ug/dL

## 2023-10-02 LAB — BASIC METABOLIC PANEL
Anion gap: 7 (ref 5–15)
BUN: 26 mg/dL — ABNORMAL HIGH (ref 8–23)
CO2: 21 mmol/L — ABNORMAL LOW (ref 22–32)
Calcium: 9.1 mg/dL (ref 8.9–10.3)
Chloride: 105 mmol/L (ref 98–111)
Creatinine, Ser: 1.42 mg/dL — ABNORMAL HIGH (ref 0.61–1.24)
GFR, Estimated: 52 mL/min — ABNORMAL LOW (ref >60)
Glucose, Bld: 74 mg/dL (ref 70–99)
Potassium: 3.3 mmol/L — ABNORMAL LOW (ref 3.5–5.1)
Sodium: 133 mmol/L — ABNORMAL LOW (ref 135–145)

## 2023-10-02 LAB — VITAMIN B12: Vitamin B-12: 1068 pg/mL — ABNORMAL HIGH (ref 180–914)

## 2023-10-02 LAB — GLUCOSE, CAPILLARY
Glucose-Capillary: 210 mg/dL — ABNORMAL HIGH (ref 70–99)
Glucose-Capillary: 80 mg/dL (ref 70–99)
Glucose-Capillary: 85 mg/dL (ref 70–99)
Glucose-Capillary: 85 mg/dL (ref 70–99)
Glucose-Capillary: 87 mg/dL (ref 70–99)

## 2023-10-02 LAB — FERRITIN: Ferritin: 222 ng/mL (ref 24–336)

## 2023-10-02 LAB — MAGNESIUM: Magnesium: 1.9 mg/dL (ref 1.7–2.4)

## 2023-10-02 SURGERY — ESOPHAGOGASTRODUODENOSCOPY (EGD) WITH PROPOFOL
Anesthesia: General

## 2023-10-02 MED ORDER — PROPOFOL 500 MG/50ML IV EMUL
INTRAVENOUS | Status: DC | PRN
  Administered 2023-10-02: 200 ug/kg/min via INTRAVENOUS

## 2023-10-02 MED ORDER — LACTATED RINGERS IV SOLN: INTRAVENOUS | Status: DC

## 2023-10-02 MED ORDER — SODIUM CHLORIDE 0.9 % IV SOLN: INTRAVENOUS | Status: DC

## 2023-10-02 MED ORDER — PROPOFOL 10 MG/ML IV BOLUS
INTRAVENOUS | Status: DC | PRN
  Administered 2023-10-02: 50 mg via INTRAVENOUS

## 2023-10-02 NOTE — Progress Notes (Signed)
Subjective: Notes lower abdominal pain for the last few months. Improved by a BM. Has bowel movements every 2-3 days at baseline. Taking Linzess 145 mcg daily unless going out. No blood in the stool. Thinks he has had some black stool. Unable to tell me how often/when it started.  No pepto. Not sure about iron. May have had some nausea but no vomiting.   NSAIDs: BC powders 1-2 times a day for back pain.   Outpatient PPI: Not sure if he was taking Nexium. Karma Greaser friend helps him with his medications. He was taking tums as needed.    Patient has 3 documented bowel movements yesterday.  Spoke with patient's nurse today who states there has been no reports of BRBPR or melena.   Objective: Vital signs in last 24 hours: Temp:  [97.9 F (36.6 C)-98.9 F (37.2 C)] 97.9 F (36.6 C) (10/15 0420) Pulse Rate:  [86-99] 86 (10/15 0420) Resp:  [20] 20 (10/15 0420) BP: (100-111)/(69-73) 102/69 (10/15 0420) SpO2:  [64 %-98 %] 64 % (10/15 0738) Last BM Date : 10/01/23 General:   Alert, oriented, NAD, cooperative, weak appearing.  Head:  Normocephalic and atraumatic. Eyes:  No icterus, sclera clear. Conjuctiva pink.  Lungs: Crackles in the bases bilaterally. No wheezing or rhonchi.  Abdomen:  Bowel sounds present, soft, non-distended. Minimal TTP in suprapubic area. Mild TTP in epigastric and RUQ region. No HSM or hernias noted. No rebound or guarding. No masses appreciated  Msk:  Symmetrical without gross deformities. Normal posture. Extremities:  Without edema. Neurologic:  Alert and  oriented x4. Psych:  Normal  mood and affect.  Intake/Output from previous day: 10/14 0701 - 10/15 0700 In: 1719.5 [P.O.:360; I.V.:1059.5; IV Piggyback:300] Out: 3500 [Urine:3500] Intake/Output this shift: No intake/output data recorded.  Lab Results: Recent Labs    09/30/23 0954 10/01/23 0450 10/02/23 0416  WBC 19.7* 15.4* 8.9  HGB 10.1* 9.9* 9.0*  HCT 31.4* 31.2* 27.7*  PLT 230 191 176    BMET Recent Labs    09/30/23 0954 10/01/23 0450 10/02/23 0416  NA 126* 131* 133*  K 3.9 3.3* 3.3*  CL 93* 101 105  CO2 22 24 21*  GLUCOSE 239* 63* 74  BUN 54* 40* 26*  CREATININE 2.97* 1.72* 1.42*  CALCIUM 9.3 9.4 9.1   LFT Recent Labs    09/30/23 0954  PROT 6.0*  ALBUMIN 3.2*  AST 29  ALT 11  ALKPHOS 24*  BILITOT 0.9    Studies/Results: CT CHEST ABDOMEN PELVIS WO CONTRAST  Result Date: 09/30/2023 CLINICAL DATA:  Altered mental status and lethargy.  Sepsis. EXAM: CT CHEST, ABDOMEN AND PELVIS WITHOUT CONTRAST TECHNIQUE: Multidetector CT imaging of the chest, abdomen and pelvis was performed following the standard protocol without IV contrast. RADIATION DOSE REDUCTION: This exam was performed according to the departmental dose-optimization program which includes automated exposure control, adjustment of the mA and/or kV according to patient size and/or use of iterative reconstruction technique. COMPARISON:  CT abdomen pelvis dated September 08, 2023. CT chest dated March 05, 2023. FINDINGS: CT CHEST FINDINGS Cardiovascular: No significant vascular findings. Normal heart size. No pericardial effusion. Unchanged mildly dilated ascending thoracic aorta measuring up 4.3 cm in diameter. Coronary, aortic arch, and branch vessel atherosclerotic vascular disease. Mediastinum/Nodes: No enlarged mediastinal, hilar, or axillary lymph nodes. Unchanged circumferential wall thickening of the distal esophagus. Normal thyroid gland. Lungs/Pleura: Small amount of debris in the trachea. New patchy tree-in-bud centrilobular micro nodularity in the right upper lobe and right middle  lobe. Subsegmental atelectasis in the right middle lobe. Unchanged trace left pleural effusion. Mild paraseptal emphysema. No pneumothorax. Musculoskeletal: No acute or significant osseous findings. CT ABDOMEN PELVIS FINDINGS Hepatobiliary: No focal liver abnormality is seen. No gallstones, gallbladder wall thickening, or  biliary dilatation. Pancreas: Mild inflammatory change in the region of the pancreatic head is likely related to descending duodenal inflammation as seen on the prior CT. No ductal dilatation. Spleen: Normal in size without focal abnormality. Adrenals/Urinary Tract: Unchanged 1.2 cm right adrenal myelolipoma. No follow-up imaging is recommended. Unchanged nodular thickening of the left adrenal gland. Unchanged bilateral renal simple and hemorrhagic cysts. No follow-up imaging is recommended. No renal calculi or hydronephrosis. New small hyperdensity in the right posterolateral bladder. No bladder wall thickening. Stomach/Bowel: Unchanged small hiatal hernia. The stomach remains distended. Prominent wall thickening of the descending duodenum is not significantly changed, although less well evaluated without intravenous contrast compared to the prior study. Remaining small bowel is unremarkable. Colonic diverticulosis. Normal appendix. Vascular/Lymphatic: Aortic atherosclerosis. No enlarged abdominal or pelvic lymph nodes. Reproductive: Unchanged prostatomegaly. Other: No free fluid or pneumoperitoneum. Musculoskeletal: No acute or significant osseous findings. IMPRESSION: 1. New patchy tree-in-bud centrilobular micronodularity in the right upper lobe and right middle lobe, consistent with aspiration pneumonia in the setting of gastric outlet obstruction from severe duodenal inflammation. 2. Unchanged prominent wall thickening and inflammation of the descending duodenum, most likely peptic ulcer disease or duodenitis. No extraluminal air or fluid collection. 3. New small hyperdensity in the right posterolateral bladder, potentially tiny layering bladder calculi. 4. Unchanged circumferential wall thickening of the distal esophagus, likely chronic esophagitis. 5. Unchanged 4.3 cm ascending thoracic aortic aneurysm. Recommend annual imaging followup by CTA or MRA. This recommendation follows 2010  ACCF/AHA/AATS/ACR/ASA/SCA/SCAI/SIR/STS/SVM Guidelines for the Diagnosis and Management of Patients with Thoracic Aortic Disease. Circulation. 2010; 121: N562-Z308. Aortic aneurysm NOS (ICD10-I71.9) 6. Aortic Atherosclerosis (ICD10-I70.0) and Emphysema (ICD10-J43.9). Electronically Signed   By: Obie Dredge M.D.   On: 09/30/2023 11:45    Assessment: 72 y.o. year old male with a history of COPD, diabetes, ischemic colitis in 2021, HLD, HTN, depression, chronic epigastric pain, constipation, prior imaging on CT scans with persistent esophageal wall thickening dating back to March 2024 , duodenitis seen on prior imaging and current, presenting with worsening acute on chronic abdominal pain, nausea, and vomiting. Admitted with sepsis in setting of pneumonia, with GI consulted due to nausea, vomiting, epigastric pain and declining hemoglobin. CT chest/abd/pelvis without contrast concerning for gastric outlet obstruction in setting of duodenal inflammation unchanged from prior,  distal esophageal wall thickening, and mild inflammatory changes in the pancreatic head suspected to be related to duodenal inflammation..   Acute on chronic epigastric pain, nausea, vomiting, acute anemia, and abnormal CT upper GI tract:  In the setting of possible gastric outlet obstruction secondary to duodenal inflammation though this finding was unchanged from prior imaging. Also with distal esophageal wall thickening (unchanged) and mild inflammatory changes in the pancreatic head expected to be secondary to duodenal inflammation. Notably, patient has CTs dating back to March 2024 showing abnormal duodenum, possible component of groove pancreatitis. however;  EGD in June demonstrated mild gastritis and an unremarkable duodenum.  No dedicated pancreatic imaging has been completed thus far. History from patient has been limited as he is a difficult historian in the setting of acute illness, but he does tell me today that he has been  taking BC powders twice daily for back pain and has also noticed some black stool at  home.  It is also notable that patient has lost about 30 pounds over the last 6-7 months.   Etiology of his CT findings/symptomatology is not entirely clear though he is at risk for PUD, gastritis, duodenitis in the setting of NSAIDs. Considering acute on chronic upper GI symptoms, reported black stool, and 4 g drop in hemoglobin on day of admission (hemoglobin 10.1, down from 14.7 in September), patient will need repeat upper endoscopy to reevaluate his upper GI track.   His hemoglobin has trended down to 9.0 this morning though this may have been secondary to hemodilution as he has had no reports of overt GI bleeding this admission.   As patient has improved from respiratory standpoint, we will plan for EGD today with Dr. Levon Hedger.  Considering weight loss, ischemia and possible occult pancreatic lesion remains in the differential. Would recommend dedicated imaging of at least the pancreas and possibly mesenteric vasculature in the future, especially if EGD unrevealing.     Plan: Npo. Proceed with upper endoscopy with propofol by Dr. Levon Hedger today. The risks, benefits, and alternatives have been discussed with the patient in detail. The patient states understanding and desires to proceed.  Iron panel, B12, folate Continue PPI BID.  Continue to trend H/H and for overt GI bleeding. Outpatient dedicated imaging of pancreas, possibly mesenteric vasculature especially if EGD is unrevealing. Outpatient colonoscopy.     LOS: 2 days    10/02/2023, 7:52 AM   Ermalinda Memos, PA-C [facility]

## 2023-10-02 NOTE — Telephone Encounter (Signed)
Patient called with no answer. Message left to return call to office. 

## 2023-10-02 NOTE — Telephone Encounter (Signed)
Please arrange hospital follow-up in 2 weeks.  Would recommend getting him back in to see Brooke Bonito, NP as she has been following him routinely outpatient.  Please note, patient is still admitted to the hospital, but we are signing off.

## 2023-10-02 NOTE — Progress Notes (Signed)
Patient refused to have procedure , notified Dr. Gwenlyn Perking and Ermalinda Memos, PA, patient states " I am not having procedure , I have waited long enough, I want to eat" , educated him that he would be in same predicament and NPO for procedure tomorrow, he verbalized understanding and states he has waited long enough,

## 2023-10-02 NOTE — Transfer of Care (Signed)
Immediate Anesthesia Transfer of Care Note  Patient: Matthew Molina  Procedure(s) Performed: ESOPHAGOGASTRODUODENOSCOPY (EGD) WITH PROPOFOL BIOPSY  Patient Location: PACU  Anesthesia Type:General  Level of Consciousness: awake, alert , oriented, and patient cooperative  Airway & Oxygen Therapy: Patient Spontanous Breathing  Post-op Assessment: Report given to RN, Post -op Vital signs reviewed and stable, and Patient moving all extremities X 4  Post vital signs: Reviewed and stable  Last Vitals:  Vitals Value Taken Time  BP 93/65 10/02/23  1618  Temp 97.3 10/02/23 1618  Pulse 88 10/02/23 1618  Resp 20 10/02/23 1618  SpO2 96 % 10/02/23 1618  Vitals shown include unfiled device data.  Last Pain:  Vitals:   10/02/23 1602  TempSrc:   PainSc: 8          Complications: No notable events documented.

## 2023-10-02 NOTE — Progress Notes (Signed)
Progress Note   Patient: Matthew Molina ZSW:[id number] DOB: 09-26-1951 DOA: 09/30/2023     2 DOS: the patient was seen and examined on 10/02/2023   Brief hospital admission course: As per H&P written by Dr. Ella Jubilee on 09/30/2023 Matthew Molina is a 72 y.o. male with medical history significant of peptic ulcer disease, COPD, T2DM, hypertension, dyslipidemia, ischemic colitis, and schizophrenia who presented with altered mental status.    Patient reports having nausea and vomiting for the last 2 to 3 days, associated with poor oral intake, and generalized weakness.  He has been vomiting stomach content, and he denies hematemesis. Positive abdominal pain generalized in location, constant and dull in nature, moderate in intensity with no improving or worsening factors. No diarrhea or constipation. His last bowel movement was 3 days ago.   10/08 he was evaluated at the gastroenterology outpatient office, for upper abdominal pain and nausea. Looks like has chronic abdominal pain. He was worked up with CT of the abdomen which showed moderate wall thickening and adjacent soft tissue stranding involving the descending duodenum representing duodenitis vs peptic ulcer disease. He was prescribed sucralfate and his proton pump inhibitor was increased from daily to bid. Noted patient recently quit alcohol consumption.   Today EMS was called because patient was altered, speaking slowly and having delayed responses. He was found hypotensive and lethargic, blood pressure 70/48. He was transported to the ED.   Assessment and Plan: *Severe sepsis secondary to right middle lobe pneumonia -Community acquired pneumonia, aspiration pneumonia, (present on admission). Complicated with severe sepsis, (end organ failure AKI, acute metabolic encephalopathy).  -Patient with underlying schizophrenia at baseline -Continue current IV antibiotics -Follow clinical response. -Renal function improving/stabilizing and trending  down back to baseline -Adequately following commands and appears to be demonstrated improvement in his encephalopathic process. -Good saturation on room air.  Peptic ulcer disease -Service has been consulted, follow recommendation for endoscopic evaluation -Continue PPI. -Continue n.p.o. status for EGD later today.  Chronic kidney disease, stage 3b (HCC) AKI, hypovolemic hyponatremia.  -Fluid resuscitation has been provided -Continue to follow electrolytes and renal function trend. -Adequate oral intake discussed with patient.  Hypertension Continue holding antihypertensive agents at the moment -Vital signs better and more stable currently.Marland Kitchen  COPD (chronic obstructive pulmonary disease) (HCC) -No signs of acute exacerbation. -Continue home bronchodilator management.  Coronary atherosclerosis of native coronary artery -No chest pain, no palpitations and no telemetry abnormalities suggesting acute ischemic process. -Continue supportive care and follow clinical response.   GERD (gastroesophageal reflux disease) -Continue PPI.  Type 2 diabetes mellitus with hyperlipidemia (HCC) -Uncontrolled hyperglycemia.  -Continue sliding scale insulin and follow CBGs repletion -Continue statin.  Schizophrenia, unspecified type (HCC) -Continue with risperidone, and trazodone   Subjective:  No fever, no chest pain, no nausea or vomiting.  Reports feeling hungry.  Physical Exam: Vitals:   10/02/23 1456 10/02/23 1616 10/02/23 1630 10/02/23 1648  BP: 121/85 93/65 117/82 (!) 145/85  Pulse: 88  90 89  Resp: 19 19 20    Temp: 98.4 F (36.9 C) (!) 97.3 F (36.3 C)  97.8 F (36.6 C)  TempSrc: Oral   Oral  SpO2: 98% 96% 97% 100%  Weight:       General exam: Alert, awake, following commands appropriately, afebrile Respiratory system: No using accessory muscles; good air movement bilaterally.  Positive scattered rhonchi. Cardiovascular system:RRR. No rubs or gallops; no murmurs on  exam. Gastrointestinal system: Abdomen is nondistended, soft and nontender.  Positive bowel sounds appreciated.  Patient expressing to be hungry. Central nervous system: Alert and oriented. No focal neurological deficits. Extremities: No cyanosis or clubbing. Skin: No petechiae. Psychiatry: Judgement and insight appear stable currently; flat affect appreciated on exam.  Data Reviewed: CBC: WBCs 8.9, hemoglobin 9.0 and platelet count 176K Basic metabolic panel: Sodium 133, potassium 3.3, chloride 105, bicarb 21, BUN 26, creatinine 1.42 and GFR 52  Family Communication: Home caregiver updated at bedside.  Disposition: Status is: Inpatient  Remains inpatient appropriate because: Continue IV antibiotics.   Planned Discharge Destination: Home  Time spent: 45 minutes  Author: Vassie Loll, MD 10/02/2023 5:43 PM  For on call review [email or web address].

## 2023-10-02 NOTE — Plan of Care (Signed)
  Problem: Education: Goal: Knowledge of General Education information will improve Description Including pain rating scale, medication(s)/side effects and non-pharmacologic comfort measures Outcome: Progressing   Problem: Health Behavior/Discharge Planning: Goal: Ability to manage health-related needs will improve Outcome: Progressing   

## 2023-10-02 NOTE — Telephone Encounter (Signed)
-----   Message from Matthew Molina sent at 10/02/2023  9:20 AM EDT ----- PSA increased. Patient need sot proceed with prostate biopsy ----- Message ----- From: Interface, [facility] Lab Results In Sent: 09/13/2023   5:38 AM EDT To: Malen Gauze, MD

## 2023-10-02 NOTE — Anesthesia Preprocedure Evaluation (Signed)
Anesthesia Evaluation  Patient identified by MRN, date of birth, ID band Patient awake    Reviewed: Allergy & Precautions, NPO status , Patient's Chart, lab work & pertinent test results, reviewed documented beta blocker date and time   Airway Mallampati: I  TM Distance: >3 FB Neck ROM: Full    Dental  (+) Edentulous Upper, Edentulous Lower   Pulmonary shortness of breath, COPD,  COPD inhaler, Current Smoker and Patient abstained from smoking. IMPRESSION: 1. Lung-RADS 2, benign appearance or behavior. Continue annual screening with low-dose chest CT without contrast in 12 months. 2. Stable mildly dilated ascending thoracic aorta, measuring up 4.3 cm. Recommend continued attention on annual lung cancer screening CT. 3. Severe left main and three-vessel coronary artery calcifications. 4. Aortic Atherosclerosis (ICD10-I70.0) and Emphysema (ICD10-J43.9).   Electronically Signed: By: Allegra Lai M.D. On: 03/06/2023 17:07      Pulmonary exam normal breath sounds clear to auscultation       Cardiovascular Exercise Tolerance: Poor hypertension, Pt. on medications and Pt. on home beta blockers + CAD and + DOE  Normal cardiovascular exam Rhythm:Regular Rate:Normal  22-Apr-2023 16:25:14 [facility] ROUTINE RECORD [birth year] (72 yr) Male Black Vent. rate 90 BPM PR interval 182 ms QRS duration 97 ms QT/QTcB 378/463 ms P-R-T axes -17 -68 6 Sinus rhythm Left anterior fascicular block RSR' in V1 or V2, probably normal variant Left ventricular hypertrophy Anterior Q waves, possibly due to LVH Baseline wander in lead(s) V5 Confirmed by Vivi Barrack [id number]) on 04/22/2023 5:09:32 PM   Neuro/Psych  PSYCHIATRIC DISORDERS Anxiety Depression  Schizophrenia     GI/Hepatic PUD,GERD  Medicated and Controlled,,  Endo/Other  diabetes, Well Controlled, Type 2, Oral Hypoglycemic Agents    Renal/GU Renal  InsufficiencyRenal disease Bladder dysfunction (BPH)      Musculoskeletal  (+) Arthritis , Osteoarthritis,    Abdominal   Peds  Hematology  (+) Blood dyscrasia, anemia   Anesthesia Other Findings   Reproductive/Obstetrics                             Anesthesia Physical Anesthesia Plan  ASA: 3 and emergent  Anesthesia Plan: General   Post-op Pain Management: Minimal or no pain anticipated   Induction: Intravenous  PONV Risk Score and Plan: 1 and Treatment may vary due to age or medical condition and Propofol infusion  Airway Management Planned: Nasal Cannula and Natural Airway  Additional Equipment:   Intra-op Plan:   Post-operative Plan:   Informed Consent: I have reviewed the patients History and Physical, chart, labs and discussed the procedure including the risks, benefits and alternatives for the proposed anesthesia with the patient or authorized representative who has indicated his/her understanding and acceptance.       Plan Discussed with: CRNA and Surgeon  Anesthesia Plan Comments:        Anesthesia Quick Evaluation

## 2023-10-02 NOTE — Brief Op Note (Signed)
10/02/2023  4:17 PM  PATIENT:  Matthew Molina  72 y.o. male  PRE-OPERATIVE DIAGNOSIS:  Acute on chronic epigastric pain, nausea, vomiting, abnormal CT upper GI tract, anemia, black stool  POST-OPERATIVE DIAGNOSIS:  duodenitis, gastric ulcers, esophageal ulcers, esophagitis  PROCEDURE:  Procedure(s): ESOPHAGOGASTRODUODENOSCOPY (EGD) WITH PROPOFOL (N/A) BIOPSY  SURGEON:  Surgeons and Role:    * Dolores Frame, MD - Primary  Patient underwent EGD under propofol sedation.  Tolerated the procedure adequately.   FINDINGS: -LA Grade B (one or more mucosal breaks greater than 5 mm, not extending between the tops of two mucosal folds) esophagitis with no bleeding was found at the gastroesophageal junction.  -Few non-bleeding superficial gastric ulcers with a clean ulcer base (Forrest Class III) were found in the gastric body.  The largest lesion was 5 mm in largest dimension.  Biopsies were taken with a cold forceps for Helicobacter pylori testing.  There was some deformity in the gastric body but no obstruction in the gastric outlet. -Diffuse moderate inflammation characterized by congestion (edema) and erythema was found in the second portion of the duodenum. Visualization was dificult due to the presence of food and angulation of the duodenum.  RECOMMENDATIONS - Return patient to hospital ward for ongoing care.  - Advance diet as tolerated.  - Check H.H daily - Schedule outpatient colonoscopy and repeat EGD in 1-2 weeks - Pantoprazole 40 mg twice a day - No  ibuprofen, naproxen, or other non-steroidal anti-inflammatory drugs.  - Patient will follow up in GI clinic  in 3-4 weeks. - GI service will sign-off, please call us back if you have any more questions.  Katrinka Blazing, MD Gastroenterology and Hepatology [facility]

## 2023-10-02 NOTE — Op Note (Signed)
[facility] Patient Name: Matthew Molina Procedure Date: 10/02/2023 3:47 PM MRN: [id number] Date of Birth: [birth year] Attending MD: Katrinka Blazing , , [id number] CSN: [id number] Age: 72 Admit Type: Inpatient Procedure:                Upper GI endoscopy Indications:              Epigastric abdominal pain, Iron deficiency anemia,                            Nausea with vomiting Providers:                Katrinka Blazing, Sheran Fava, Crystal Page Referring MD:             Hennie Duos. Marletta Lor, DO Medicines:                Monitored Anesthesia Care Complications:            No immediate complications. Estimated Blood Loss:     Estimated blood loss: none. Procedure:                Pre-Anesthesia Assessment:                           - Prior to the procedure, a History and Physical                            was performed, and patient medications, allergies                            and sensitivities were reviewed. The patient's                            tolerance of previous anesthesia was reviewed.                           - The risks and benefits of the procedure and the                            sedation options and risks were discussed with the                            patient. All questions were answered and informed                            consent was obtained.                           - ASA Grade Assessment: III - A patient with severe                            systemic disease.                           After obtaining informed consent, the endoscope was                            passed under  direct vision. Throughout the                            procedure, the patient's blood pressure, pulse, and                            oxygen saturations were monitored continuously. The                            [id number]-[id number] ([id number]) scope was introduced through the                            mouth, and advanced to the second part of duodenum.                             The upper GI endoscopy was accomplished without                            difficulty. The patient tolerated the procedure                            well. Scope In: 4:05:00 PM Scope Out: 4:12:03 PM Total Procedure Duration: 0 hours 7 minutes 3 seconds  Findings:      LA Grade B (one or more mucosal breaks greater than 5 mm, not extending       between the tops of two mucosal folds) esophagitis with no bleeding was       found at the gastroesophageal junction.      Few non-bleeding superficial gastric ulcers with a clean ulcer base       (Forrest Class III) were found in the gastric body. The largest lesion       was 5 mm in largest dimension. Biopsies were taken with a cold forceps       for Helicobacter pylori testing.      There was some deformity in the gastric body but no obstruction in the       gastric outlet.      Diffuse moderate inflammation characterized by congestion (edema) and       erythema was found in the second portion of the duodenum. Visualization       was dificult due to the presence of food and angulation of the duodenum. Impression:               - LA Grade B esophagitis with no bleeding.                           - Non-bleeding gastric ulcers with a clean ulcer                            base (Forrest Class III). Biopsied.                           - Duodenitis. Moderate Sedation:      Per Anesthesia Care Recommendation:           - Return patient to hospital ward for ongoing care.                           -  Advance diet as tolerated.                           - Check H.H daily                           - Schedule outpatient colonoscopy and repeat EGD in                            1-2 weeks                           - Pantoprazole 40 mg twice a day                           - No ibuprofen, naproxen, or other non-steroidal                            anti-inflammatory drugs. Procedure Code(s):        --- Professional ---                           [phone],  Esophagogastroduodenoscopy, flexible,                            transoral; with biopsy, single or multiple Diagnosis Code(s):        --- Professional ---                           K20.90, Esophagitis, unspecified without bleeding                           K25.9, Gastric ulcer, unspecified as acute or                            chronic, without hemorrhage or perforation                           K29.80, Duodenitis without bleeding                           R10.13, Epigastric pain                           D50.9, Iron deficiency anemia, unspecified                           R11.2, Nausea with vomiting, unspecified CPT copyright 2022 American Medical Association. All rights reserved. The codes documented in this report are preliminary and upon coder review may  be revised to meet current compliance requirements. Katrinka Blazing, MD Katrinka Blazing,  10/02/2023 4:20:02 PM This report has been signed electronically. Number of Addenda: 0

## 2023-10-03 DIAGNOSIS — J189 Pneumonia, unspecified organism: Secondary | ICD-10-CM | POA: Diagnosis not present

## 2023-10-03 LAB — GLUCOSE, CAPILLARY
Glucose-Capillary: 140 mg/dL — ABNORMAL HIGH (ref 70–99)
Glucose-Capillary: 157 mg/dL — ABNORMAL HIGH (ref 70–99)

## 2023-10-03 MED ORDER — AMOXICILLIN-POT CLAVULANATE 875-125 MG PO TABS: 1.0000 | ORAL_TABLET | Freq: Two times a day (BID) | ORAL | 0 refills | Status: AC

## 2023-10-03 MED ORDER — PANTOPRAZOLE SODIUM 40 MG PO TBEC
40.0000 mg | DELAYED_RELEASE_TABLET | Freq: Two times a day (BID) | ORAL | 1 refills | Status: DC
Start: 2023-10-03 — End: 2023-11-10

## 2023-10-03 NOTE — Discharge Summary (Signed)
Physician Discharge Summary  [name]:[id number] DOB: [birth year] DOA: 09/30/2023  PCP: Kerri Perches, MD  Admit date: 09/30/2023  Discharge date: 10/03/2023  Admitted From:Home  Disposition:  Home  Recommendations for Outpatient Follow-up:  Follow up with PCP in 1-2 weeks Follow-up with GI for outpatient EGD/colonoscopy which will be rescheduled in 2 weeks Continue Carafate and PPI twice daily Avoid NSAIDs Resume home antihypertensives once blood pressures confirmed to be more elevated, hold for now Continue on Augmentin as prescribed for 4 more days to complete course of treatment for pneumonia  Home Health: None  Equipment/Devices: Has home walker  Discharge Condition:Stable  CODE STATUS: Full  Diet recommendation: Heart Healthy/carb modified  Brief/Interim Summary: Matthew Molina is a 72 y.o. male with medical history significant of peptic ulcer disease, COPD, T2DM, hypertension, dyslipidemia, ischemic colitis, and schizophrenia who presented with altered mental status.    Patient reports having nausea and vomiting for the last 2 to 3 days, associated with poor oral intake, and generalized weakness.  He has been vomiting stomach content, and he denies hematemesis. Positive abdominal pain generalized in location, constant and dull in nature, moderate in intensity with no improving or worsening factors. No diarrhea or constipation. His last bowel movement was 3 days ago.  He was admitted for severe sepsis secondary to right middle lobe pneumonia in the setting of peptic ulcer disease.  He was started on treatment with IV Unasyn with improvement in overall clinical condition and was seen by GI and underwent EGD on 10/15 with findings of LA grade B esophagitis.  He is tolerating diet and has no further symptomatology and will continue on medications as noted above.  He will follow-up with GI in approximately 2 weeks.  No other acute events or concerns  noted.  Discharge Diagnoses:  Principal Problem:   Right middle lobe pneumonia Active Problems:   Peptic ulcer disease   Hypertension   Chronic kidney disease, stage 3b (HCC)   COPD (chronic obstructive pulmonary disease) (HCC)   Coronary atherosclerosis of native coronary artery   GERD (gastroesophageal reflux disease)   Type 2 diabetes mellitus with hyperlipidemia (HCC)   Schizophrenia, unspecified type (HCC)   Nausea and vomiting   Iron deficiency anemia  Principal discharge diagnosis: Severe sepsis secondary to right middle lobe pneumonia in the setting of abdominal pain with peptic ulcer disease.  Discharge Instructions   Allergies as of 10/03/2023       Reactions   Asa [aspirin] Other (See Comments)   Stomach pain   Crestor [rosuvastatin] Other (See Comments)   Stomach pain   Wellbutrin [bupropion] Other (See Comments)   Stomach pain   Zoloft [sertraline Hcl] Other (See Comments)   Stomach pain        Medication List     STOP taking these medications    carvedilol 6.25 MG tablet Commonly known as: COREG   esomeprazole 40 MG capsule Commonly known as: NEXIUM   meloxicam 15 MG tablet Commonly known as: MOBIC   olmesartan 40 MG tablet Commonly known as: BENICAR   triamterene-hydrochlorothiazide 37.5-25 MG tablet Commonly known as: MAXZIDE-25       TAKE these medications    Accu-Chek Guide test strip Generic drug: glucose blood USE TO CHECK BLOOD SUGAR ONCE DAILY   Accu-Chek Softclix Lancets lancets TEST BLOOD SUGAR ONCE DAILY AS DIRECTED.   acetaminophen 500 MG tablet Commonly known as: TYLENOL Take 1,000 mg by mouth 2 (two) times daily as needed for headache, moderate  pain or fever.   albuterol 108 (90 Base) MCG/ACT inhaler Commonly known as: VENTOLIN HFA INHALE 2 PUFFS INTO THE LUNGS EVERY 6 HOURS AS NEEDED FOR WHEEZING OR SHORTNESS OF BREATH.   amoxicillin-clavulanate 875-125 MG tablet Commonly known as: AUGMENTIN Take 1 tablet by  mouth 2 (two) times daily for 4 days.   busPIRone 7.5 MG tablet Commonly known as: BUSPAR Take 7.5 mg by mouth 3 (three) times daily.   Farxiga 5 MG Tabs tablet Generic drug: dapagliflozin propanediol Take 5 mg by mouth daily.   fenofibrate 145 MG tablet Commonly known as: TRICOR Take 1 tablet (145 mg total) by mouth daily.   glipiZIDE 10 MG 24 hr tablet Commonly known as: GLUCOTROL XL TAKE (2) TABLETS BY MOUTH DAILY EVERY MORNING AT BREAKFAST.   ipratropium-albuterol 0.5-2.5 (3) MG/3ML Soln Commonly known as: DUONEB INHALE 1 VIAL VIA NEBULIZER EVERY 4 TO 6 HOURS. What changed: See the new instructions.   linaclotide 145 MCG Caps capsule Commonly known as: Linzess Take 1 capsule (145 mcg total) by mouth daily before breakfast.   montelukast 10 MG tablet Commonly known as: SINGULAIR TAKE (1) TABLET BY MOUTH AT BEDTIME.   multivitamin with minerals tablet Take 1 tablet by mouth daily.   ondansetron 4 MG disintegrating tablet Commonly known as: ZOFRAN-ODT DISSOLVE 1 TABLET ON TONGUE ONCE DAILY AS NEEDED.   pantoprazole 40 MG tablet Commonly known as: Protonix Take 1 tablet (40 mg total) by mouth 2 (two) times daily.   risperidone 4 MG tablet Commonly known as: RISPERDAL TAKE ONE TABLET BY MOUTH IN THE EVENING. TAKE WITH 1MG  TABLET. What changed: See the new instructions.   risperiDONE 1 MG tablet Commonly known as: RISPERDAL Take 1 tablet (1 mg total) by mouth at bedtime. What changed: Another medication with the same name was changed. Make sure you understand how and when to take each.   sucralfate 1 GM/10ML suspension Commonly known as: CARAFATE Take 10 mLs (1 g total) by mouth 4 (four) times daily -  with meals and at bedtime.   Symbicort 160-4.5 MCG/ACT inhaler Generic drug: budesonide-formoterol INHALE 2 PUFFS INTO THE LUNGS ONCE IN THE MORNING AND AT BEDTIME.   tamsulosin 0.4 MG Caps capsule Commonly known as: FLOMAX Take 1 capsule (0.4 mg total) by  mouth daily after supper.   traZODone 50 MG tablet Commonly known as: DESYREL TAKE ONE TABLET BY MOUTH AT BEDTIME AS NEEDED FOR SLEEP. What changed:  when to take this additional instructions        Follow-up Information     Kerri Perches, MD. Schedule an appointment as soon as possible for a visit in 1 week(s).   Specialty: Family Medicine Contact information: [address] [place] [address] [phone]         [facility]. Go in 2 week(s).   Contact information: [address] [place] [address] [phone]               Allergies  Allergen Reactions   Jonne Ply [Aspirin] Other (See Comments)    Stomach pain   Crestor [Rosuvastatin] Other (See Comments)    Stomach pain   Wellbutrin [Bupropion] Other (See Comments)    Stomach pain   Zoloft [Sertraline Hcl] Other (See Comments)    Stomach pain    Consultations: GI   Procedures/Studies: CT CHEST ABDOMEN PELVIS WO CONTRAST  Result Date: 09/30/2023 CLINICAL DATA:  Altered mental status and lethargy.  Sepsis. EXAM: CT CHEST, ABDOMEN AND  PELVIS WITHOUT CONTRAST TECHNIQUE: Multidetector CT imaging of the chest, abdomen and pelvis was performed following the standard protocol without IV contrast. RADIATION DOSE REDUCTION: This exam was performed according to the departmental dose-optimization program which includes automated exposure control, adjustment of the mA and/or kV according to patient size and/or use of iterative reconstruction technique. COMPARISON:  CT abdomen pelvis dated September 08, 2023. CT chest dated March 05, 2023. FINDINGS: CT CHEST FINDINGS Cardiovascular: No significant vascular findings. Normal heart size. No pericardial effusion. Unchanged mildly dilated ascending thoracic aorta measuring up 4.3 cm in diameter. Coronary, aortic arch, and branch vessel atherosclerotic vascular disease. Mediastinum/Nodes: No enlarged mediastinal,  hilar, or axillary lymph nodes. Unchanged circumferential wall thickening of the distal esophagus. Normal thyroid gland. Lungs/Pleura: Small amount of debris in the trachea. New patchy tree-in-bud centrilobular micro nodularity in the right upper lobe and right middle lobe. Subsegmental atelectasis in the right middle lobe. Unchanged trace left pleural effusion. Mild paraseptal emphysema. No pneumothorax. Musculoskeletal: No acute or significant osseous findings. CT ABDOMEN PELVIS FINDINGS Hepatobiliary: No focal liver abnormality is seen. No gallstones, gallbladder wall thickening, or biliary dilatation. Pancreas: Mild inflammatory change in the region of the pancreatic head is likely related to descending duodenal inflammation as seen on the prior CT. No ductal dilatation. Spleen: Normal in size without focal abnormality. Adrenals/Urinary Tract: Unchanged 1.2 cm right adrenal myelolipoma. No follow-up imaging is recommended. Unchanged nodular thickening of the left adrenal gland. Unchanged bilateral renal simple and hemorrhagic cysts. No follow-up imaging is recommended. No renal calculi or hydronephrosis. New small hyperdensity in the right posterolateral bladder. No bladder wall thickening. Stomach/Bowel: Unchanged small hiatal hernia. The stomach remains distended. Prominent wall thickening of the descending duodenum is not significantly changed, although less well evaluated without intravenous contrast compared to the prior study. Remaining small bowel is unremarkable. Colonic diverticulosis. Normal appendix. Vascular/Lymphatic: Aortic atherosclerosis. No enlarged abdominal or pelvic lymph nodes. Reproductive: Unchanged prostatomegaly. Other: No free fluid or pneumoperitoneum. Musculoskeletal: No acute or significant osseous findings. IMPRESSION: 1. New patchy tree-in-bud centrilobular micronodularity in the right upper lobe and right middle lobe, consistent with aspiration pneumonia in the setting of gastric  outlet obstruction from severe duodenal inflammation. 2. Unchanged prominent wall thickening and inflammation of the descending duodenum, most likely peptic ulcer disease or duodenitis. No extraluminal air or fluid collection. 3. New small hyperdensity in the right posterolateral bladder, potentially tiny layering bladder calculi. 4. Unchanged circumferential wall thickening of the distal esophagus, likely chronic esophagitis. 5. Unchanged 4.3 cm ascending thoracic aortic aneurysm. Recommend annual imaging followup by CTA or MRA. This recommendation follows 2010 ACCF/AHA/AATS/ACR/ASA/SCA/SCAI/SIR/STS/SVM Guidelines for the Diagnosis and Management of Patients with Thoracic Aortic Disease. Circulation. 2010; 121: D664-Q034. Aortic aneurysm NOS (ICD10-I71.9) 6. Aortic Atherosclerosis (ICD10-I70.0) and Emphysema (ICD10-J43.9). Electronically Signed   By: Obie Dredge M.D.   On: 09/30/2023 11:45   CT ABDOMEN PELVIS W CONTRAST  Result Date: 09/08/2023 CLINICAL DATA:  Abdominal pain with radiation to back for 2 weeks. EXAM: CT ABDOMEN AND PELVIS WITH CONTRAST TECHNIQUE: Multidetector CT imaging of the abdomen and pelvis was performed using the standard protocol following bolus administration of intravenous contrast. RADIATION DOSE REDUCTION: This exam was performed according to the departmental dose-optimization program which includes automated exposure control, adjustment of the mA and/or kV according to patient size and/or use of iterative reconstruction technique. CONTRAST:  OMNIPAQUE IOHEXOL 300 MG/ML  SOLN COMPARISON:  06/16/2023 FINDINGS: Lower Chest: No acute findings. Hepatobiliary: No suspicious hepatic masses identified. Gallbladder is  unremarkable. No evidence of biliary ductal dilatation. Pancreas:  No mass or inflammatory changes. Spleen: Within normal limits in size and appearance. Adrenals/Urinary Tract: No suspicious masses identified. No evidence of ureteral calculi or hydronephrosis.  Stomach/Bowel: Stable wall thickening of distal thoracic esophagus. Distention of the stomach and proximal duodenum is seen. Moderate wall thickening and adjacent soft tissue stranding is seen involving the descending duodenum, which may be due to duodenitis or peptic ulcer disease. No evidence of free intraperitoneal air. No evidence of bowel obstruction. Diffuse colonic diverticulosis is noted,, however, there is no evidence of diverticulitis. Normal appendix visualized. Vascular/Lymphatic: No pathologically enlarged lymph nodes. No acute vascular findings. Reproductive:  Stable markedly enlarged prostate. Other: Tiny paraumbilical ventral hernia is again seen which contains only fat. Musculoskeletal:  No suspicious bone lesions identified. IMPRESSION: Moderate wall thickening and adjacent soft tissue stranding involving the descending duodenum, which may be due to duodenitis or peptic ulcer disease. No evidence of free intraperitoneal air or abscess. Stable wall thickening of distal thoracic esophagus, suspicious for chronic esophagitis. Colonic diverticulosis, without radiographic evidence of diverticulitis. Stable markedly enlarged prostate. Electronically Signed   By: Danae Orleans M.D.   On: 09/08/2023 18:01     Discharge Exam: Vitals:   10/03/23 0450 10/03/23 0734  BP: 124/81   Pulse: 94   Resp: 18   Temp: 98.1 F (36.7 C)   SpO2: 99% 94%   Vitals:   10/02/23 1648 10/02/23 2031 10/03/23 0450 10/03/23 0734  BP: (!) 145/85 120/77 124/81   Pulse: 89 95 94   Resp:  20 18   Temp: 97.8 F (36.6 C) 97.6 F (36.4 C) 98.1 F (36.7 C)   TempSrc: Oral Oral    SpO2: 100% 97% 99% 94%  Weight:        General: Pt is alert, awake, not in acute distress Cardiovascular: RRR, S1/S2 +, no rubs, no gallops Respiratory: CTA bilaterally, no wheezing, no rhonchi Abdominal: Soft, NT, ND, bowel sounds + Extremities: no edema, no cyanosis    The results of significant diagnostics from this  hospitalization (including imaging, microbiology, ancillary and laboratory) are listed below for reference.     Microbiology: No results found for this or any previous visit (from the past 240 hour(s)).   Labs: BNP (last 3 results) Recent Labs    12/08/22 0340  BNP 47.0   Basic Metabolic Panel: Recent Labs  Lab 09/30/23 0954 10/01/23 0450 10/02/23 0416  NA 126* 131* 133*  K 3.9 3.3* 3.3*  CL 93* 101 105  CO2 22 24 21*  GLUCOSE 239* 63* 74  BUN 54* 40* 26*  CREATININE 2.97* 1.72* 1.42*  CALCIUM 9.3 9.4 9.1  MG  --   --  1.9   Liver Function Tests: Recent Labs  Lab 09/30/23 0954  AST 29  ALT 11  ALKPHOS 24*  BILITOT 0.9  PROT 6.0*  ALBUMIN 3.2*   No results for input(s): "LIPASE", "AMYLASE" in the last 168 hours. Recent Labs  Lab 09/30/23 1656  AMMONIA 14   CBC: Recent Labs  Lab 09/30/23 0954 10/01/23 0450 10/02/23 0416  WBC 19.7* 15.4* 8.9  NEUTROABS 17.9*  --   --   HGB 10.1* 9.9* 9.0*  HCT 31.4* 31.2* 27.7*  MCV 89.5 89.9 90.8  PLT 230 191 176   Cardiac Enzymes: No results for input(s): "CKTOTAL", "CKMB", "CKMBINDEX", "TROPONINI" in the last 168 hours. BNP: Invalid input(s): "POCBNP" CBG: Recent Labs  Lab 10/02/23 0920 10/02/23 1105 10/02/23 1502 10/02/23 2034  10/03/23 0751  GLUCAP 85 85 87 210* 140*   D-Dimer No results for input(s): "DDIMER" in the last 72 hours. Hgb A1c No results for input(s): "HGBA1C" in the last 72 hours. Lipid Profile No results for input(s): "CHOL", "HDL", "LDLCALC", "TRIG", "CHOLHDL", "LDLDIRECT" in the last 72 hours. Thyroid function studies No results for input(s): "TSH", "T4TOTAL", "T3FREE", "THYROIDAB" in the last 72 hours.  Invalid input(s): "FREET3" Anemia work up Recent Labs    10/02/23 0416  VITAMINB12 1,068*  FOLATE 12.2  FERRITIN 222  TIBC 228*  IRON 16*   Urinalysis    Component Value Date/Time   COLORURINE YELLOW 09/08/2023 1837   APPEARANCEUR CLEAR 09/08/2023 1837   APPEARANCEUR  Clear 06/26/2022 1102   LABSPEC >1.046 (H) 09/08/2023 1837   PHURINE 7.0 09/08/2023 1837   GLUCOSEU >=500 (A) 09/08/2023 1837   HGBUR NEGATIVE 09/08/2023 1837   HGBUR negative 02/25/2009 1257   BILIRUBINUR NEGATIVE 09/08/2023 1837   BILIRUBINUR Negative 06/26/2022 1102   KETONESUR 5 (A) 09/08/2023 1837   PROTEINUR 100 (A) 09/08/2023 1837   UROBILINOGEN 0.2 02/25/2009 1257   NITRITE NEGATIVE 09/08/2023 1837   LEUKOCYTESUR NEGATIVE 09/08/2023 1837   Sepsis Labs Recent Labs  Lab 09/30/23 0954 10/01/23 0450 10/02/23 0416  WBC 19.7* 15.4* 8.9   Microbiology No results found for this or any previous visit (from the past 240 hour(s)).   Time coordinating discharge: 35 minutes  SIGNED:   Erick Blinks, DO [facility] 10/03/2023, 10:04 AM  If 7PM-7AM, please contact night-coverage [email or web address]

## 2023-10-03 NOTE — Care Management Important Message (Signed)
Important Message  Patient Details  Name: Matthew Molina MRN: [id number] Date of Birth: Feb 21, 1951   Important Message Given:  Yes - Medicare IM     Corey Harold 10/03/2023, 2:23 PM

## 2023-10-03 NOTE — Consult Note (Signed)
[facility] Customer service manager [facility]) Accountable Care Organization (ACO) [facility] Liaison Note  10/03/2023  Matthew Molina Nov 26, 1951 [id number]  Location: [facility] RN Hospital Liaison screened the patient remotely at [facility].  Insurance: [employer]   Matthew Molina is a 72 y.o. male who is a Primary Care Patient of Lodema Hong, Milus Mallick, MD [facility]. The patient was screened for readmission hospitalization with noted high risk score for unplanned readmission risk with 1 IP/3 ED in 6 months.  The patient was assessed for potential [facility]) Care Management service needs for post hospital transition for care coordination. Review of patient's electronic medical record reveals patient was admitted with Pneumonia. Liaison spoke with pt and family member in the home concerning community case management services. Offered a post hospital prevention readmission from a nurse care coordinator to assist with possible disease management of care (receptive).  Plan: [facility] Liaison will continue to follow progress and disposition to asess for post hospital community care coordination/management needs.  Referral request for community care coordination: Will make a referral for a nurse care coordinator for hospital prevention readmission follow up call.   [facility] Care Management/Population Health does not replace or interfere with any arrangements made by the Inpatient Transition of Care team.   For questions contact:   Elliot Cousin, RN, [facility] Liaison [place]   Population Health Office Hours MTWF  8:00 am-6:00 pm [phone] mobile [phone] [Office toll free line] Office Hours are M-F 8:30 - 5 pm [email or web address]

## 2023-10-04 ENCOUNTER — Telehealth: Payer: Self-pay | Admitting: *Deleted

## 2023-10-04 ENCOUNTER — Telehealth: Payer: Self-pay

## 2023-10-04 ENCOUNTER — Other Ambulatory Visit: Payer: Self-pay | Admitting: Gastroenterology

## 2023-10-04 LAB — SURGICAL PATHOLOGY

## 2023-10-04 MED ORDER — SUCRALFATE 1 GM/10ML PO SUSP: 1.0000 g | Freq: Three times a day (TID) | ORAL | 0 refills | Status: DC

## 2023-10-04 NOTE — Transitions of Care (Post Inpatient/ED Visit) (Signed)
10/04/2023  Name: KALYNN BURMEISTER MRN: [id number] DOB: 26-Feb-1951  Today's TOC FU Call Status: Today's TOC FU Call Status:: Unsuccessful Call (1st Attempt) Unsuccessful Call (1st Attempt) Date: 10/04/23  Attempted to reach the patient regarding the most recent Inpatient/ED visit.  Follow Up Plan: Additional outreach attempts will be made to reach the patient to complete the Transitions of Care (Post Inpatient/ED visit) call.   Deidre Ala, RN Medical illustrator [facility]Population Health [phone]

## 2023-10-04 NOTE — Telephone Encounter (Signed)
Received refill request for sucralfate 1gm. Send to [facility]

## 2023-10-05 DIAGNOSIS — R809 Proteinuria, unspecified: Secondary | ICD-10-CM | POA: Diagnosis not present

## 2023-10-05 DIAGNOSIS — D472 Monoclonal gammopathy: Secondary | ICD-10-CM | POA: Diagnosis not present

## 2023-10-05 DIAGNOSIS — E1122 Type 2 diabetes mellitus with diabetic chronic kidney disease: Secondary | ICD-10-CM | POA: Diagnosis not present

## 2023-10-05 DIAGNOSIS — N1831 Chronic kidney disease, stage 3a: Secondary | ICD-10-CM | POA: Diagnosis not present

## 2023-10-05 NOTE — Telephone Encounter (Signed)
-----   Message from Wilkie Aye sent at 10/02/2023  9:20 AM EDT ----- PSA increased. Patient need sot proceed with prostate biopsy ----- Message ----- From: Interface, [facility] Lab Results In Sent: 09/13/2023   5:38 AM EDT To: Malen Gauze, MD

## 2023-10-05 NOTE — Telephone Encounter (Signed)
Patient called and made aware. Patient states he will call back Monday morning to schedule prostate biopsy.

## 2023-10-07 NOTE — Anesthesia Postprocedure Evaluation (Signed)
Anesthesia Post Note  Patient: Matthew Molina  Procedure(s) Performed: ESOPHAGOGASTRODUODENOSCOPY (EGD) WITH PROPOFOL BIOPSY  Patient location during evaluation: Phase II Anesthesia Type: General Level of consciousness: awake Pain management: pain level controlled Vital Signs Assessment: post-procedure vital signs reviewed and stable Respiratory status: spontaneous breathing and respiratory function stable Cardiovascular status: blood pressure returned to baseline and stable Postop Assessment: no headache and no apparent nausea or vomiting Anesthetic complications: no Comments: Late entry   No notable events documented.   Last Vitals:  Vitals:   10/04/23 0001 10/04/23 0649  BP: 132/63 (!) 87/55  Pulse: (!) 51 96  Resp: 18 18  Temp:  37.2 C  SpO2: 97% 93%    Last Pain:  Vitals:   10/03/23 0816  TempSrc:   PainSc: 0-No pain                 Windell Norfolk

## 2023-10-09 ENCOUNTER — Telehealth: Payer: Self-pay

## 2023-10-09 ENCOUNTER — Encounter (INDEPENDENT_AMBULATORY_CARE_PROVIDER_SITE_OTHER): Payer: Self-pay | Admitting: *Deleted

## 2023-10-09 ENCOUNTER — Encounter (HOSPITAL_COMMUNITY): Payer: Self-pay | Admitting: Gastroenterology

## 2023-10-09 NOTE — Telephone Encounter (Addendum)
Patient called and made aware of prostate biopsy. Instructions went over with patient via phone and sent via mail.

## 2023-10-09 NOTE — Transitions of Care (Post Inpatient/ED Visit) (Signed)
10/09/2023  Name: Matthew Molina MRN: [id number] DOB: Aug 24, 1951  Today's TOC FU Call Status: Today's TOC FU Call Status:: Successful TOC FU Call Completed TOC FU Call Complete Date: 10/09/23 Patient's Name and Date of Birth confirmed.  Transition Care Management Follow-up Telephone Call Date of Discharge: 10/03/23 [facility] Type of Discharge: Inpatient Admission Primary Inpatient Discharge Diagnosis:: Pneumonia How have you been since you were released from the hospital?: Better Any questions or concerns?: No  Items Reviewed: Did you receive and understand the discharge instructions provided?: Yes Medications obtained,verified, and reconciled?: Yes (Medications Reviewed) Any new allergies since your discharge?: No Dietary orders reviewed?: NA Do you have support at home?: Yes Name of Support/Comfort Primary Source: Caregivers through Medicaid  Medications Reviewed Today: Medications Reviewed Today     Reviewed by Redge Gainer, RN (Case Manager) on 10/09/23 at 0945  Med List Status: <None>   Medication Order Taking? Sig Documenting Provider Last Dose Status Informant  ACCU-CHEK GUIDE test strip [id number] No USE TO CHECK BLOOD SUGAR ONCE DAILY Kerri Perches, MD Taking Active Family Member, Pharmacy Records  Accu-Chek Softclix Lancets lancets [id number] No TEST BLOOD SUGAR ONCE DAILY AS DIRECTED. Kerri Perches, MD Taking Active Family Member, Pharmacy Records  acetaminophen (TYLENOL) 500 MG tablet [id number] No Take 1,000 mg by mouth 2 (two) times daily as needed for headache, moderate pain or fever. [provider] 09/29/2023 Active Family Member, Pharmacy Records  albuterol (VENTOLIN HFA) 108 (90 Base) MCG/ACT inhaler [id number] No INHALE 2 PUFFS INTO THE LUNGS EVERY 6 HOURS AS NEEDED FOR WHEEZING OR SHORTNESS OF BREATH. Kerri Perches, MD Past Month Active Family Member, Pharmacy Records  busPIRone (BUSPAR) 7.5 MG tablet  [id number] No Take 7.5 mg by mouth 3 (three) times daily. [provider] 09/29/2023 Active Family Member, Pharmacy Records  FARXIGA 5 MG TABS tablet [id number] No Take 5 mg by mouth daily. [provider] 09/29/2023 Active Family Member, Pharmacy Records  fenofibrate (TRICOR) 145 MG tablet [id number] No Take 1 tablet (145 mg total) by mouth daily. Kerri Perches, MD 09/29/2023 Active Family Member, Pharmacy Records  glipiZIDE (GLUCOTROL XL) 10 MG 24 hr tablet [id number] No TAKE (2) TABLETS BY MOUTH DAILY EVERY MORNING AT BREAKFAST. Kerri Perches, MD 09/29/2023 Active Family Member, Pharmacy Records  ipratropium-albuterol (DUONEB) 0.5-2.5 (3) MG/3ML SOLN [id number] No INHALE 1 VIAL VIA NEBULIZER EVERY 4 TO 6 HOURS.  Patient taking differently: Take 3 mLs by nebulization 2 (two) times daily.   Kerri Perches, MD 09/29/2023 Active Family Member, Pharmacy Records  linaclotide [facility]) 145 MCG CAPS capsule [id number] No Take 1 capsule (145 mcg total) by mouth daily before breakfast. Aida Raider, NP 09/29/2023 Active Family Member, Pharmacy Records  montelukast (SINGULAIR) 10 MG tablet [id number] No TAKE (1) TABLET BY MOUTH AT BEDTIME. Kerri Perches, MD 09/29/2023 Active Family Member, Pharmacy Records  Multiple Vitamins-Minerals (MULTIVITAMIN WITH MINERALS) tablet [id number] No Take 1 tablet by mouth daily. [provider] 09/29/2023 Active Family Member, Pharmacy Records  ondansetron (ZOFRAN-ODT) 4 MG disintegrating tablet [id number] No DISSOLVE 1 TABLET ON TONGUE ONCE DAILY AS NEEDED. Aida Raider, NP 09/29/2023 Active Family Member, Pharmacy Records  pantoprazole (PROTONIX) 40 MG tablet [id number]  Take 1 tablet (40 mg total) by mouth 2 (two) times daily. Sherryll Burger, Pratik D, DO  Active   risperiDONE (RISPERDAL) 1 MG tablet [id number] No Take 1 tablet (1 mg total) by mouth at bedtime. Kerri Perches, MD 09/29/2023 Active Family  Member, Pharmacy Records            Med Note (COFFELL, Marzella Schlein   Sun Sep 30, 2023  5:56 PM) Pt is taking 2 strengths of risperidone at bedtime for TDD 5mg .  risperidone (RISPERDAL) 4 MG tablet [id number] No TAKE ONE TABLET BY MOUTH IN THE EVENING. TAKE WITH 1MG  TABLET.  Patient taking differently: Take 4 mg by mouth at bedtime.   Kerri Perches, MD 09/29/2023 Active Family Member, Pharmacy Records   Discontinued 03/11/12 2224 (No longer needed (for PRN medications))   sucralfate (CARAFATE) 1 GM/10ML suspension [id number]  Take 10 mLs (1 g total) by mouth 4 (four) times daily -  with meals and at bedtime. Aida Raider, NP  Active   SYMBICORT 160-4.5 MCG/ACT inhaler [id number] No INHALE 2 PUFFS INTO THE LUNGS ONCE IN THE MORNING AND AT BEDTIME. Kerri Perches, MD 09/29/2023 Active Family Member, Pharmacy Records  tamsulosin [facility]) 0.4 MG CAPS capsule [id number] No Take 1 capsule (0.4 mg total) by mouth daily after supper. Malen Gauze, MD 09/29/2023 Active Family Member, Pharmacy Records  traZODone (DESYREL) 50 MG tablet [id number] No TAKE ONE TABLET BY MOUTH AT BEDTIME AS NEEDED FOR SLEEP.  Patient taking differently: Take 50 mg by mouth at bedtime.   Kerri Perches, MD 09/29/2023 Active Family Member, Pharmacy Records            Home Care and Equipment/Supplies: Were Home Health Services Ordered?: NA Any new equipment or medical supplies ordered?: Yes Name of Medical supply agency?: [employer] Were you able to get the equipment/medical supplies?: Yes Do you have any questions related to the use of the equipment/supplies?: No  Functional Questionnaire: Do you need assistance with bathing/showering or dressing?: No Do you need assistance with meal preparation?: Yes Do you need assistance with eating?: No Do you have difficulty maintaining continence: No Do you need assistance with getting out of bed/getting out of a chair/moving?: No Do you have difficulty managing or taking your medications?:  Yes  Follow up appointments reviewed: PCP Follow-up appointment confirmed?: Yes Date of PCP follow-up appointment?: 10/12/23 Follow-up Provider: Syliva Overman Specialist [facility] Follow-up appointment confirmed?: NA Do you need transportation to your follow-up appointment?: No Do you understand care options if your condition(s) worsen?: Yes-patient verbalized understanding  SDOH Interventions Today    Flowsheet Row Most Recent Value  SDOH Interventions   Food Insecurity Interventions Intervention Not Indicated  Transportation Interventions Intervention Not Indicated      The patient has an aide with him during the day but he is home at night by himself. He states the neighbors check in on him. He goes to HD three times a week and has transportation. Declines enrollment in the 30 day outreach program.  Deidre Ala, RN RN Care Manager [facility] [phone]

## 2023-10-09 NOTE — Telephone Encounter (Signed)
Patient returned call. Requesting a call back at new number -   [phone] [facility])

## 2023-10-11 ENCOUNTER — Telehealth: Payer: Self-pay

## 2023-10-12 ENCOUNTER — Ambulatory Visit (INDEPENDENT_AMBULATORY_CARE_PROVIDER_SITE_OTHER): Payer: 59 | Admitting: Family Medicine

## 2023-10-12 ENCOUNTER — Encounter: Payer: Self-pay | Admitting: Family Medicine

## 2023-10-12 VITALS — BP 138/82 | HR 116 | Ht 73.0 in | Wt 203.0 lb

## 2023-10-12 DIAGNOSIS — J189 Pneumonia, unspecified organism: Secondary | ICD-10-CM

## 2023-10-12 DIAGNOSIS — K279 Peptic ulcer, site unspecified, unspecified as acute or chronic, without hemorrhage or perforation: Secondary | ICD-10-CM

## 2023-10-12 DIAGNOSIS — I10 Essential (primary) hypertension: Secondary | ICD-10-CM | POA: Diagnosis not present

## 2023-10-12 DIAGNOSIS — M25552 Pain in left hip: Secondary | ICD-10-CM | POA: Diagnosis not present

## 2023-10-12 DIAGNOSIS — Z720 Tobacco use: Secondary | ICD-10-CM

## 2023-10-12 DIAGNOSIS — E1159 Type 2 diabetes mellitus with other circulatory complications: Secondary | ICD-10-CM | POA: Diagnosis not present

## 2023-10-12 DIAGNOSIS — M25551 Pain in right hip: Secondary | ICD-10-CM

## 2023-10-12 DIAGNOSIS — R2681 Unsteadiness on feet: Secondary | ICD-10-CM

## 2023-10-12 DIAGNOSIS — E1169 Type 2 diabetes mellitus with other specified complication: Secondary | ICD-10-CM | POA: Diagnosis not present

## 2023-10-12 DIAGNOSIS — E162 Hypoglycemia, unspecified: Secondary | ICD-10-CM

## 2023-10-12 DIAGNOSIS — E785 Hyperlipidemia, unspecified: Secondary | ICD-10-CM | POA: Diagnosis not present

## 2023-10-12 DIAGNOSIS — Z7689 Persons encountering health services in other specified circumstances: Secondary | ICD-10-CM

## 2023-10-12 MED ORDER — UNABLE TO FIND: 0 refills | Status: DC

## 2023-10-12 NOTE — Patient Instructions (Addendum)
F/u as before, call  if you need me  sooner  Stop glipizide  Take medication only as prescribed  cBC, chem 7 and eGFR ansd HBA1C today  Pls order testing supplies for twice daily testing of blood sugar   Needs  rx script for walking cane   Thanks for choosing [facility], we consider it a privelige to serve you.

## 2023-10-13 LAB — BMP8+EGFR
BUN/Creatinine Ratio: 12 (ref 10–24)
BUN: 15 mg/dL (ref 8–27)
CO2: 24 mmol/L (ref 20–29)
Calcium: 10.3 mg/dL — ABNORMAL HIGH (ref 8.6–10.2)
Chloride: 102 mmol/L (ref 96–106)
Creatinine, Ser: 1.29 mg/dL — ABNORMAL HIGH (ref 0.76–1.27)
Glucose: 60 mg/dL — ABNORMAL LOW (ref 70–99)
Potassium: 3.5 mmol/L (ref 3.5–5.2)
Sodium: 140 mmol/L (ref 134–144)
eGFR: 59 mL/min/{1.73_m2} — ABNORMAL LOW (ref >59)

## 2023-10-13 LAB — CBC
Hematocrit: 30.4 % — ABNORMAL LOW (ref 37.5–51.0)
Hemoglobin: 9.6 g/dL — ABNORMAL LOW (ref 13.0–17.7)
MCH: 28.5 pg (ref 26.6–33.0)
MCHC: 31.6 g/dL (ref 31.5–35.7)
MCV: 90 fL (ref 79–97)
Platelets: 280 10*3/uL (ref 150–450)
RBC: 3.37 x10E6/uL — ABNORMAL LOW (ref 4.14–5.80)
RDW: 14 % (ref 11.6–15.4)
WBC: 6.6 10*3/uL (ref 3.4–10.8)

## 2023-10-13 LAB — HEMOGLOBIN A1C
Est. average glucose Bld gHb Est-mCnc: 126 mg/dL
Hgb A1c MFr Bld: 6 % — ABNORMAL HIGH (ref 4.8–5.6)

## 2023-10-14 DIAGNOSIS — E162 Hypoglycemia, unspecified: Secondary | ICD-10-CM | POA: Insufficient documentation

## 2023-10-14 NOTE — Assessment & Plan Note (Signed)
Severe arthritis in lumbar  spine requires assistive device , rx for cane to pharmacy

## 2023-10-14 NOTE — Assessment & Plan Note (Signed)
Fluctuating blood sugar with hypoglycemia, increase testing to twice daily

## 2023-10-14 NOTE — Assessment & Plan Note (Signed)
Patient in for follow up of recent hospitalization. Discharge summary, and laboratory and radiology data are reviewed, and any questions or concerns  are discussed. Specific issues requiring follow up are specifically addressed.  

## 2023-10-14 NOTE — Assessment & Plan Note (Signed)
DASH diet and commitment to daily physical activity for a minimum of 30 minutes discussed and encouraged, as a part of hypertension management. The importance of attaining a healthy weight is also discussed.     10/12/2023    3:16 PM 10/04/2023    6:49 AM 10/04/2023   12:01 AM 10/03/2023    4:50 AM 10/02/2023    8:31 PM 10/02/2023    4:48 PM 10/02/2023    4:30 PM  BP/Weight  Systolic BP 138 87 132 124 120 145 117  Diastolic BP 82 55 63 81 77 85 82  Wt. (Lbs) 203.04        BMI 26.79 kg/m2          No change inmanagement

## 2023-10-14 NOTE — Assessment & Plan Note (Signed)
Has completed antibiotic course, will repeat imaging in the future.No c/o dypnea or productive cough

## 2023-10-14 NOTE — Progress Notes (Signed)
Matthew Molina     MRN: [id number]      DOB: [birth year]  Chief Complaint  Patient presents with   Hospitalization Follow-up    TOC headache stomach ache, sugar running low     HPI Matthew Molina is here for follow up of recent hospitalization from 10/13 to 10/03/2023, admitted with severe sepsis , secondary to RML pneumonia in setting of PUD Has completed Augmentin Requests cane for ambulation, forgot his cane  at the hospital and has severe arthritis in spine  Hospital course reviewed and questions answered to pt satisfaction Still c/o weakness and fatigue with blood sugar running low. Not eating much ROS.   Denies dysuria, frequency, hesitancy or incontinence. C/o back pain and limitation in mobility. Denies headaches, seizures,  Denies uncontrolled  depression, anxiety or insomnia. Denies skin break down or rash.   PE  BP 138/82 (BP Location: Right Arm, Patient Position: Sitting, Cuff Size: Large)   Pulse (!) 116   Ht 6\' 1"  (1.854 m)   Wt 203 lb 0.6 oz (92.1 kg)   SpO2 95%   BMI 26.79 kg/m   Patient ill appearing, tachycardic .  HEENT: No facial asymmetry, EOMI,     Neck supple .  Chest: decreased air entry.  CVS: S1, S2 no murmurs, no S3.Regular rate.  ABD: Soft mid epigastric tenderness.   Ext: No edema  MS: decreased  ROM spine, shoulders, hips and knees.  Skin: Intact, no ulcerations or rash noted.  Psych: Good eye contact, normal affect. Memory intact not anxious or depressed appearing.  CNS: CN 2-12 intact, power,  normal throughout.no focal deficits noted.   Assessment & Plan RML pneumonia Has completed antibiotic course, will repeat imaging in the future.No c/o dypnea or productive cough  Peptic ulcer disease Has appt with GI, needs to avoid all NSAIDS and oral prednisone, rept CBC  Type 2 diabetes mellitus with hyperlipidemia [facility]) Matthew Molina is reminded of the importance of commitment to daily physical activity for 30 minutes or more, as able  and the need to limit carbohydrate intake to 30 to 60 grams per meal to help with blood sugar control.   The need to take medication as prescribed, test blood sugar as directed, and to call between visits if there is a concern that blood sugar is uncontrolled is also discussed.   Matthew Molina is reminded of the importance of daily foot exam, annual eye examination, and good blood sugar, blood pressure and cholesterol control.     Latest Ref Rng & Units 10/12/2023    4:07 PM 10/02/2023    4:16 AM 10/01/2023    4:50 AM 09/30/2023    9:54 AM 09/08/2023    4:25 PM  Diabetic Labs  HbA1c 4.8 - 5.6 % 6.0       Creatinine 0.76 - 1.27 mg/dL 7.82  9.56  2.13  0.86  1.45       10/12/2023    3:16 PM 10/04/2023    6:49 AM 10/04/2023   12:01 AM 10/03/2023    4:50 AM 10/02/2023    8:31 PM 10/02/2023    4:48 PM 10/02/2023    4:30 PM  BP/Weight  Systolic BP 138 87 132 124 120 145 117  Diastolic BP 82 55 63 81 77 85 82  Wt. (Lbs) 203.04        BMI 26.79 kg/m2            Latest Ref Rng & Units 02/20/2023   12:00 AM  09/19/2022    9:00 AM  Foot/eye exam completion dates  Eye Exam No Retinopathy No Retinopathy       Foot Form Completion   Done     This result is from an external source.      Recurrent hypoglycemia, poor appetite. Stop glipizide , containue daily blood sugar testing  Tobacco abuse Asked:confirms currently smokes cigarettes Assess: Unwilling to set a quit date, but is cutting back Advise: needs to QUIT to reduce risk of cancer, cardio and cerebrovascular disease Assist: counseled for 5 minutes and literature provided Arrange: follow up in 2 to 4 months   Hypertension DASH diet and commitment to daily physical activity for a minimum of 30 minutes discussed and encouraged, as a part of hypertension management. The importance of attaining a healthy weight is also discussed.     10/12/2023    3:16 PM 10/04/2023    6:49 AM 10/04/2023   12:01 AM 10/03/2023    4:50 AM  10/02/2023    8:31 PM 10/02/2023    4:48 PM 10/02/2023    4:30 PM  BP/Weight  Systolic BP 138 87 132 124 120 145 117  Diastolic BP 82 55 63 81 77 85 82  Wt. (Lbs) 203.04        BMI 26.79 kg/m2          No change inmanagement   Encounter for support and coordination of transition of care Patient in for follow up of recent hospitalization. Discharge summary, and laboratory and radiology data are reviewed, and any questions or concerns  are discussed. Specific issues requiring follow up are specifically addressed.   Unsteady gait Severe arthritis in lumbar  spine requires assistive device , rx for cane to pharmacy  Hypoglycemia Fluctuating blood sugar with hypoglycemia, increase testing to twice daily

## 2023-10-14 NOTE — Assessment & Plan Note (Signed)
Asked:confirms currently smokes cigarettes °Assess: Unwilling to set a quit date, but is cutting back °Advise: needs to QUIT to reduce risk of cancer, cardio and cerebrovascular disease °Assist: counseled for 5 minutes and literature provided °Arrange: follow up in 2 to 4 months ° °

## 2023-10-14 NOTE — Assessment & Plan Note (Signed)
Has appt with GI, needs to avoid all NSAIDS and oral prednisone, rept CBC

## 2023-10-14 NOTE — Assessment & Plan Note (Signed)
Matthew Molina is reminded of the importance of commitment to daily physical activity for 30 minutes or more, as able and the need to limit carbohydrate intake to 30 to 60 grams per meal to help with blood sugar control.   The need to take medication as prescribed, test blood sugar as directed, and to call between visits if there is a concern that blood sugar is uncontrolled is also discussed.   Matthew Molina is reminded of the importance of daily foot exam, annual eye examination, and good blood sugar, blood pressure and cholesterol control.     Latest Ref Rng & Units 10/12/2023    4:07 PM 10/02/2023    4:16 AM 10/01/2023    4:50 AM 09/30/2023    9:54 AM 09/08/2023    4:25 PM  Diabetic Labs  HbA1c 4.8 - 5.6 % 6.0       Creatinine 0.76 - 1.27 mg/dL 1.61  0.96  0.45  4.09  1.45       10/12/2023    3:16 PM 10/04/2023    6:49 AM 10/04/2023   12:01 AM 10/03/2023    4:50 AM 10/02/2023    8:31 PM 10/02/2023    4:48 PM 10/02/2023    4:30 PM  BP/Weight  Systolic BP 138 87 132 124 120 145 117  Diastolic BP 82 55 63 81 77 85 82  Wt. (Lbs) 203.04        BMI 26.79 kg/m2            Latest Ref Rng & Units 02/20/2023   12:00 AM 09/19/2022    9:00 AM  Foot/eye exam completion dates  Eye Exam No Retinopathy No Retinopathy       Foot Form Completion   Done     This result is from an external source.      Recurrent hypoglycemia, poor appetite. Stop glipizide , containue daily blood sugar testing

## 2023-10-16 ENCOUNTER — Telehealth: Payer: Self-pay | Admitting: *Deleted

## 2023-10-16 ENCOUNTER — Other Ambulatory Visit: Payer: Self-pay | Admitting: Gastroenterology

## 2023-10-16 MED ORDER — SUCRALFATE 1 GM/10ML PO SUSP: 1.0000 g | Freq: Three times a day (TID) | ORAL | 1 refills | Status: DC

## 2023-10-16 NOTE — Telephone Encounter (Signed)
Refill request for Sucralfate from [place]. Pt last OV 09/25/2023

## 2023-10-17 ENCOUNTER — Other Ambulatory Visit: Payer: Self-pay | Admitting: Nurse Practitioner

## 2023-10-17 ENCOUNTER — Other Ambulatory Visit: Payer: Self-pay | Admitting: Family Medicine

## 2023-10-17 DIAGNOSIS — J449 Chronic obstructive pulmonary disease, unspecified: Secondary | ICD-10-CM

## 2023-10-18 ENCOUNTER — Encounter: Payer: 59 | Admitting: Family Medicine

## 2023-10-18 ENCOUNTER — Ambulatory Visit: Payer: 59 | Admitting: Gastroenterology

## 2023-10-18 NOTE — Progress Notes (Deleted)
GI Office Note    Referring Provider: Kerri Perches, MD Primary Care Physician:  Kerri Perches, MD Primary Gastroenterologist: Hennie Duos. Marletta Lor, DO  Date:  10/18/2023  ID:  Matthew Molina, DOB 02-03-1951, MRN [id number]   Chief Complaint   No chief complaint on file.  History of Present Illness  Matthew Molina is a 72 y.o. male with a history of *** presenting today with complaint of   EGD March 2017: -Esophageal stricture s/p dilation -Gastritis and duodenitis -Biopsies positive for H. Pylori   Initially treated for H. pylori with Prevpac and then subsequently Pylera.  Has had documented eradication.   Colonoscopy April 2021: -7 colon polyps -Pancolonic diverticulosis -External and internal hemorrhoids -Pathology revealed tubular adenomas -Recommend repeat colonoscopy in 3 years   CT chest 03/05/2023: -Moderate focal circumferential wall thickening of lower esophagus, recommended GI referral for endoscopic correlation to exclude esophageal malignancy -Lung RADS 2, benign appearance -Stable mildly dilated ascending thoracic aorta measuring 4.3 cm   Office visit 03/15/2023 by Dr. Marletta Lor. Noted to have found distal esophageal wall thickening on CT chest for lung cancer screening.  Patient denies any dysphagia but does report chronic reflux which is well-controlled on pantoprazole 40 mg once daily.  Denies any family history of gastrointestinal cancer.  Did report taking Mobic daily for joint pains. Advised continue PPI daily.  Colonoscopy offered but patient declined.  Advised to schedule for EGD to further evaluate abnormal esophagus on CT scan.  Advised fatty liver diet for abnormal LFTs.   ED visit 5/5 for abdominal pain.  Brought in by EMS for epigastric and suprapubic abdominal pain for 2-3 days with nausea and vomiting and decreased p.o. intake.  Reported similar symptoms once before that he stated was due to constipation.  Admitted to drinking at least 1 can of  beer per day.  Abdominal exam without any peritoneal signs.  UA was negative for UTI, WBC 8.7, hemoglobin 13.7, creatinine 1.53, lipase 124.  CT with circumferential wall thickening and moderate enhancement of proximal duodenum with adjacent fat stranding with differentials including duodenitis or groove pancreatitis, circumferential wall thickening of distal esophagus which can be seen in the setting of esophagitis.  He was noted to have epigastric tenderness on exam.  He was recommended to follow a liquid diet and was given nausea and pain control to take at home and advised on small nonfatty meals to start in a few days.  He was discussed the importance of scheduling his upper endoscopy and patient reported he needed more time.  Advised to follow-up if symptoms worsened.   Office visit 04/30/23.  Patient states when he eats his stomach bothers him he will sip on Sprite zero and lay down and sometimes that helps.  Primarily is eating soft foods.  Trying to stay away from fried foods and attempts to drink a lot of liquids.  Having nausea but no vomiting.  Taking pantoprazole once daily.  Uses Zofran as needed.  Stopped taking his B12 and now only taking a multivitamin.  Patient reported he stopped drinking just prior to his ED visit in early May.  Taking Dulcolax over-the-counter to help with constipation.  Denied any straining.  Requested to trial Linzess.  Denied any dysphagia.  Given Linzess samples.  Advised alcohol cessation and continue PPI once daily.  EGD ordered.  Advised low-fat diet.  Patient declined colonoscopy.   EGD 05/28/2023: -Variable Z-line, s/p biopsy - Gastritis s/p biopsy - Small hiatal hernia - Normal  duodenum - PPI changes noted in the stomach, negative for H. pylori.  GE junction biopsy with mild chronic carditis and hyperplastic changes   ED visit for epigastric pain 06/16/2023.  Labs with sodium 133, potassium 3.2, creatinine 1.54.  Normal LFTs.  Lipase elevated at 103.  CT scan  revealed persistent circumferential thickening of the distal esophageal wall likely related to reflux.  Hiatal hernia seen again.  No pancreatic ductal dilation or surrounding inflammatory changes seen.  Scattered diverticular change of the colon noted.  Second and third portions of the duodenum again demonstrate circumferential thickening and a somewhat mottled attenuation pattern.  Radiology suspected this could be due to a degree of groove pancreatitis given elevated lipase on day of exam.  No evidence of diverticulitis.  He was given GI cocktail and hypokalemia treated.  He was given Carafate on discharge as well as Zofran.   Office visit 08/08/23. BM 1-2 times per week with straining.  Intermittently hard stools.  Continue to have upper abdominal pain every day with nausea.  Taking Carafate and still following soft diet, continues to have lack of appetite.  Also having some intermittent bloating and gassiness.  Plan to increase Linzess to 145 mcg daily.  Continue Zofran and Carafate as needed.  Stop pantoprazole and start Nexium 40 mg once daily.  Reinforced alcohol and tobacco cessation.  Follow GERD diet.   ED visit 09/08/2023 with several days of abdominal discomfort with occasional diarrhea and pain radiating to his back.  Denies any vomiting.  No postprandial component.  Mildly tachycardic on exam.  Labs remarkable for potassium of 3.3, creatinine 1.45, glucose 162, GFR 51.  Rare bacteria on UA and presence of ketones and protein.  Treated with IV fluids, Zofran, morphine, PPI. Ct performed.    CT A/P with contrast 09/08/2023: -Moderate wall thickening and adjacent soft tissue stranding involving descending duodenum representing duodenitis were PUD -Stable wall thickening of distal thoracic esophagus suspicious for chronic esophagitis -Colonic diverticulosis without diverticulitis -Stable markedly enlarged prostate  Last office visit 09/25/23. ***   Admitted to [facility] 10/13-10/16 for AMS.  He had  reported 2-3 days of nausea and vomiting associated with poor oral intake and generalized weakness.  CT obtained and outlined below.  Denied hematemesis and reported vomiting stomach contents.  Also with generalized constant dull abdominal pain.  Denied any constipation or diarrhea but bowel movement 3 days prior to admission.  He was admitted with severe sepsis and diagnosed with right middle lobe pneumonia.  He was treated with IV Unasyn.  Our service was consulted and he underwent EGD on 10/15 as noted below.  He had admitted to [facility] powders 1-2 times per day for back pain.  He was discharged on pantoprazole 40 mg twice daily, sucralfate 4 times daily, Linzess 145 mcg daily, and Augmentin.  Artane advised dedicated imaging of the pancreas and possible mesenteric vasculature if EGD unrevealing and also advised outpatient colonoscopy.  After procedure it was recommended for him to have an outpatient colonoscopy as well as a repeat EGD in 1-2 weeks and strongly advised to avoid all NSAIDs and Goody powders.  CT chest/abdomen/pelvis without contrast 09/30/2023: -Patchy Magod Giuliani in right upper lobe and right middle lobe consistent with aspiration pneumonia -Unchanged wall thickening and inflammation of descending duodenum (likely PUD or duodenitis) -New small hyperdensity in the right posterolateral bladder potentially tiny layering bladder calculi -Unchanged circumferential wall thickening of distal esophagus -Mild laboratory change in the region of the pancreatic head likely related  to dissing being duodenal inflammation as seen on prior CT without ductal dilation -Unchanged 4.3 cm ascending thoracic aortic aneurysm (recommended follow-up with CTA)  EGD 10/02/2023: - LA Grade B esophagitis with no bleeding.  - Non- bleeding gastric ulcers with a clean ulcer base ( Forrest Class III) . Biopsied. Largest measuring 5mm - Duodenitis - Advised to continue PPI twice daily and avoid all NSAIDs - Advised  outpatient colonoscopy and repeat EGD in 1-2 weeks   Today:       Current Outpatient Medications  Medication Sig Dispense Refill   ACCU-CHEK GUIDE test strip USE TO CHECK BLOOD SUGAR ONCE DAILY 100 strip 0   Accu-Chek Softclix Lancets lancets TEST BLOOD SUGAR ONCE DAILY AS DIRECTED. 100 each 5   acetaminophen (TYLENOL) 500 MG tablet Take 1,000 mg by mouth 2 (two) times daily as needed for headache, moderate pain or fever.     albuterol (VENTOLIN HFA) 108 (90 Base) MCG/ACT inhaler INHALE 2 PUFFS INTO THE LUNGS EVERY 6 HOURS AS NEEDED FOR WHEEZING OR SHORTNESS OF BREATH. 8.5 g 0   busPIRone (BUSPAR) 7.5 MG tablet Take 7.5 mg by mouth 3 (three) times daily.     FARXIGA 5 MG TABS tablet Take 5 mg by mouth daily.     fenofibrate (TRICOR) 145 MG tablet Take 1 tablet (145 mg total) by mouth daily. 30 tablet 3   ipratropium-albuterol (DUONEB) 0.5-2.5 (3) MG/3ML SOLN INHALE 1 VIAL VIA NEBULIZER EVERY 4 TO 6 HOURS. (Patient taking differently: Take 3 mLs by nebulization 2 (two) times daily.) 540 mL 3   linaclotide (LINZESS) 145 MCG CAPS capsule Take 1 capsule (145 mcg total) by mouth daily before breakfast. 30 capsule 3   montelukast (SINGULAIR) 10 MG tablet TAKE (1) TABLET BY MOUTH AT BEDTIME. 30 tablet 0   Multiple Vitamins-Minerals (MULTIVITAMIN WITH MINERALS) tablet Take 1 tablet by mouth daily.     ondansetron (ZOFRAN-ODT) 4 MG disintegrating tablet DISSOLVE 1 TABLET ON TONGUE ONCE DAILY AS NEEDED. 20 tablet 0   pantoprazole (PROTONIX) 40 MG tablet Take 1 tablet (40 mg total) by mouth 2 (two) times daily. 60 tablet 1   risperiDONE (RISPERDAL) 1 MG tablet Take 1 tablet (1 mg total) by mouth at bedtime. 90 tablet 1   risperidone (RISPERDAL) 4 MG tablet TAKE ONE TABLET BY MOUTH IN THE EVENING. TAKE WITH 1MG  TABLET. (Patient taking differently: Take 4 mg by mouth at bedtime.) 90 tablet 0   sucralfate (CARAFATE) 1 GM/10ML suspension Take 10 mLs (1 g total) by mouth 4 (four) times daily -  with meals  and at bedtime. 420 mL 1   SYMBICORT 160-4.5 MCG/ACT inhaler INHALE 2 PUFFS INTO THE LUNGS ONCE IN THE MORNING AND AT BEDTIME. 10.2 g 0   tamsulosin (FLOMAX) 0.4 MG CAPS capsule Take 1 capsule (0.4 mg total) by mouth daily after supper. 90 capsule 3   traZODone (DESYREL) 50 MG tablet TAKE ONE TABLET BY MOUTH AT BEDTIME AS NEEDED FOR SLEEP. 90 tablet 0   UNABLE TO FIND Med Name: walking cane 1 each 0   No current facility-administered medications for this visit.    Past Medical History:  Diagnosis Date   Acute blood loss anemia 01/05/2020   Acute respiratory failure with hypoxia (HCC)    Arthritis    COPD (chronic obstructive pulmonary disease) (HCC)    Depression    Diabetes mellitus    Diabetes mellitus without complication (HCC)    Diverticulitis    Elevated PSA 10/01/2016  Encounter for support and coordination of transition of care 01/14/2020   Hypercholesterolemia    Hyperlipidemia    Hypertension    Insomnia 01/05/2016   Ischemic colitis (HCC) 01/05/2020   Multiple lung nodules on CT 04/02/2015   Nicotine addiction    Obesity    Oxygen deficiency    qhs   Schizophrenia [facility])     Past Surgical History:  Procedure Laterality Date   BIOPSY  05/28/2023   Procedure: BIOPSY;  Surgeon: Lanelle Bal, DO;  [facility];  Service: Endoscopy;;   BIOPSY  10/02/2023   Procedure: BIOPSY;  Surgeon: Dolores Frame, MD;  Location: [facility];  Service: Gastroenterology;;   CATARACT EXTRACTION W/PHACO Left 11/20/2013   Procedure: CATARACT EXTRACTION PHACO AND INTRAOCULAR LENS PLACEMENT (IOC);  Surgeon: Gemma Payor, MD;  Location: [facility];  Service: Ophthalmology;  Laterality: Left;  CDE:10.26   CATARACT EXTRACTION W/PHACO Right 12/08/2013   Procedure: RIGHT EYE CATARACT EXTRACTION PHACO AND INTRAOCULAR LENS PLACEMENT ;  Surgeon: Gemma Payor, MD;  Location: [facility];  Service: Ophthalmology;  Laterality: Right;  CDE 12.38   COLONOSCOPY N/A 04/01/2013    pancolonic diverticulosis, redundant colon, large internal hemorrhoids.    COLONOSCOPY  2014   INCOMPLETE PREP IN R COLON   COLONOSCOPY N/A 04/01/2020   Procedure: COLONOSCOPY;  Surgeon: West Bali, MD;  [facility]: [facility];  Service: Endoscopy;  Laterality: N/A;  8:45am   ESOPHAGOGASTRODUODENOSCOPY N/A 02/18/2016   stricture at GE junction, moderate erosive gastritis and mild non-erosive duodenitis. +H.pylori. Treated initially with Prevpac. Breath test to check for eradication was positive, and he was prescribed Pylera.    ESOPHAGOGASTRODUODENOSCOPY (EGD) WITH PROPOFOL N/A 05/28/2023   Procedure: ESOPHAGOGASTRODUODENOSCOPY (EGD) WITH PROPOFOL;  Surgeon: Lanelle Bal, DO;  [facility];  Service: Endoscopy;  Laterality: N/A;  11:15am, asa 3   ESOPHAGOGASTRODUODENOSCOPY (EGD) WITH PROPOFOL N/A 10/02/2023   Procedure: ESOPHAGOGASTRODUODENOSCOPY (EGD) WITH PROPOFOL;  Surgeon: Dolores Frame, MD;  [facility];  Service: Gastroenterology;  Laterality: N/A;   EYE SURGERY Left 11/2013   cataract extraction   POLYPECTOMY  04/01/2020   Procedure: POLYPECTOMY;  Surgeon: West Bali, MD;  [facility]: [facility];  Service: Endoscopy;;   SAVORY DILATION N/A 02/18/2016   Procedure: SAVORY DILATION;  Surgeon: West Bali, MD;  [facility]: [facility];  Service: Endoscopy;  Laterality: N/A;   SHOULDER SURGERY Right     Family History  Problem Relation Age of Onset   Diabetes Mother    Hypertension Mother    Stroke Mother    Diabetes Sister    Heart disease Sister    Kidney disease Father    Drug abuse Brother    Colon cancer Neg Hx    Colon polyps Neg Hx     Allergies as of 10/18/2023 - Review Complete 10/12/2023  Allergen Reaction Noted   Asa [aspirin] Other (See Comments) 09/30/2023   Crestor [rosuvastatin] Other (See Comments) 09/30/2023   Wellbutrin [bupropion] Other (See Comments) 07/29/2015   Zoloft [sertraline hcl] Other (See  Comments) 11/05/2015    Social History   Socioeconomic History   Marital status: Divorced    Spouse name: Not on file   Number of children: 2   Years of education: Not on file   Highest education level: Not on file  Occupational History   Occupation: retired from [employer] stage [employer]   Tobacco Use   Smoking status: Every Day    Current packs/day: 1.00  Average packs/day: 1 pack/day for 48.0 years (48.0 ttl pk-yrs)    Types: Cigarettes   Smokeless tobacco: Never  Vaping Use   Vaping status: Never Used  Substance and Sexual Activity   Alcohol use: Yes    Alcohol/week: 0.0 standard drinks of alcohol    Comment: "little bit of beer"   Drug use: No   Sexual activity: Not Currently    Birth control/protection: None  Other Topics Concern   Not on file  Social History Narrative   ** Merged History Encounter **       ** Merged History Encounter **       Social Determinants of Health   Financial Resource Strain: Low Risk  (12/13/2022)   Overall Financial Resource Strain (CARDIA)    Difficulty of Paying Living Expenses: Not very hard  Food Insecurity: No Food Insecurity (10/09/2023)   Hunger Vital Sign    Worried About Running Out of Food in the Last Year: Never true    Ran Out of Food in the Last Year: Never true  Transportation Needs: No Transportation Needs (10/09/2023)   PRAPARE - Administrator, Civil Service (Medical): No    Lack of Transportation (Non-Medical): No  Physical Activity: Inactive (11/27/2022)   Exercise Vital Sign    Days of Exercise per Week: 0 days    Minutes of Exercise per Session: 0 min  Stress: No Stress Concern Present (11/27/2022)   [employer] of Occupational Health - Occupational Stress Questionnaire    Feeling of Stress : Not at all  Social Connections: Moderately Isolated (11/27/2022)   Social Connection and Isolation Panel [NHANES]    Frequency of Communication with Friends and Family: More than three times a week    Frequency of  Social Gatherings with Friends and Family: More than three times a week    Attends Religious Services: More than 4 times per year    Active Member of [employer] or Organizations: No    Attends Banker Meetings: Never    Marital Status: Divorced     Review of Systems   Gen: Denies fever, chills, anorexia. Denies fatigue, weakness, weight loss.  CV: Denies chest pain, palpitations, syncope, peripheral edema, and claudication. Resp: Denies dyspnea at rest, cough, wheezing, coughing up blood, and pleurisy. GI: See HPI Derm: Denies rash, itching, dry skin Psych: Denies depression, anxiety, memory loss, confusion. No homicidal or suicidal ideation.  Heme: Denies bruising, bleeding, and enlarged lymph nodes.   Physical Exam   There were no vitals taken for this visit.  General:   Alert and oriented. No distress noted. Pleasant and cooperative.  Head:  Normocephalic and atraumatic. Eyes:  Conjuctiva clear without scleral icterus. Mouth:  Oral mucosa pink and moist. Good dentition. No lesions. Lungs:  Clear to auscultation bilaterally. No wheezes, rales, or rhonchi. No distress.  Heart:  S1, S2 present without murmurs appreciated.  Abdomen:  +BS, soft, non-tender and non-distended. No rebound or guarding. No HSM or masses noted. Rectal: *** Msk:  Symmetrical without gross deformities. Normal posture. Extremities:  Without edema. Neurologic:  Alert and  oriented x4 Psych:  Alert and cooperative. Normal mood and affect.   Assessment  DWIJ FOSHEE is a 72 y.o. male with a history of COPD, diabetes, ischemic colitis in 2021, HLD, HTN, depression, GERD/chronic epigastric pain, and presenting today for hospital follow up.  Peptic ulcer disease, GERD, nausea, epigastric pain:  Esophageal wall thickening:  Adenomatous colon polyps:  Constipation:  PLAN   ***  Brooke Bonito, MSN, FNP-BC, AGACNP-BC [facility]

## 2023-10-19 ENCOUNTER — Other Ambulatory Visit: Payer: Self-pay

## 2023-10-19 MED ORDER — ACCU-CHEK SOFTCLIX LANCETS MISC: 5 refills | Status: DC

## 2023-10-23 ENCOUNTER — Inpatient Hospital Stay (HOSPITAL_COMMUNITY): Payer: 59

## 2023-10-23 ENCOUNTER — Other Ambulatory Visit: Payer: Self-pay

## 2023-10-23 ENCOUNTER — Emergency Department (HOSPITAL_COMMUNITY): Payer: 59

## 2023-10-23 ENCOUNTER — Inpatient Hospital Stay (HOSPITAL_COMMUNITY)
Admission: EM | Admit: 2023-10-23 | Discharge: 2023-11-18 | DRG: 326 | Disposition: E | Payer: 59 | Attending: Internal Medicine | Admitting: Internal Medicine

## 2023-10-23 ENCOUNTER — Inpatient Hospital Stay: Payer: 59

## 2023-10-23 ENCOUNTER — Encounter: Payer: 59 | Admitting: Family Medicine

## 2023-10-23 ENCOUNTER — Encounter (HOSPITAL_COMMUNITY): Payer: Self-pay | Admitting: Internal Medicine

## 2023-10-23 DIAGNOSIS — K828 Other specified diseases of gallbladder: Secondary | ICD-10-CM | POA: Diagnosis not present

## 2023-10-23 DIAGNOSIS — I63441 Cerebral infarction due to embolism of right cerebellar artery: Secondary | ICD-10-CM | POA: Diagnosis not present

## 2023-10-23 DIAGNOSIS — R112 Nausea with vomiting, unspecified: Secondary | ICD-10-CM | POA: Diagnosis present

## 2023-10-23 DIAGNOSIS — E872 Acidosis, unspecified: Secondary | ICD-10-CM | POA: Diagnosis present

## 2023-10-23 DIAGNOSIS — A419 Sepsis, unspecified organism: Secondary | ICD-10-CM | POA: Diagnosis not present

## 2023-10-23 DIAGNOSIS — D6489 Other specified anemias: Secondary | ICD-10-CM | POA: Diagnosis not present

## 2023-10-23 DIAGNOSIS — R140 Abdominal distension (gaseous): Secondary | ICD-10-CM | POA: Diagnosis not present

## 2023-10-23 DIAGNOSIS — F209 Schizophrenia, unspecified: Secondary | ICD-10-CM | POA: Diagnosis present

## 2023-10-23 DIAGNOSIS — R188 Other ascites: Secondary | ICD-10-CM | POA: Diagnosis not present

## 2023-10-23 DIAGNOSIS — J439 Emphysema, unspecified: Secondary | ICD-10-CM | POA: Diagnosis present

## 2023-10-23 DIAGNOSIS — R142 Eructation: Secondary | ICD-10-CM | POA: Diagnosis not present

## 2023-10-23 DIAGNOSIS — I2699 Other pulmonary embolism without acute cor pulmonale: Secondary | ICD-10-CM | POA: Diagnosis not present

## 2023-10-23 DIAGNOSIS — Z66 Do not resuscitate: Secondary | ICD-10-CM | POA: Diagnosis not present

## 2023-10-23 DIAGNOSIS — Z9889 Other specified postprocedural states: Secondary | ICD-10-CM | POA: Diagnosis not present

## 2023-10-23 DIAGNOSIS — Z720 Tobacco use: Secondary | ICD-10-CM | POA: Diagnosis not present

## 2023-10-23 DIAGNOSIS — E876 Hypokalemia: Secondary | ICD-10-CM | POA: Diagnosis present

## 2023-10-23 DIAGNOSIS — N400 Enlarged prostate without lower urinary tract symptoms: Secondary | ICD-10-CM | POA: Diagnosis present

## 2023-10-23 DIAGNOSIS — E871 Hypo-osmolality and hyponatremia: Secondary | ICD-10-CM | POA: Diagnosis present

## 2023-10-23 DIAGNOSIS — E8770 Fluid overload, unspecified: Secondary | ICD-10-CM | POA: Diagnosis not present

## 2023-10-23 DIAGNOSIS — I10 Essential (primary) hypertension: Secondary | ICD-10-CM | POA: Diagnosis not present

## 2023-10-23 DIAGNOSIS — I81 Portal vein thrombosis: Secondary | ICD-10-CM | POA: Diagnosis present

## 2023-10-23 DIAGNOSIS — Z886 Allergy status to analgesic agent status: Secondary | ICD-10-CM

## 2023-10-23 DIAGNOSIS — J449 Chronic obstructive pulmonary disease, unspecified: Secondary | ICD-10-CM | POA: Diagnosis not present

## 2023-10-23 DIAGNOSIS — F32A Depression, unspecified: Secondary | ICD-10-CM | POA: Diagnosis present

## 2023-10-23 DIAGNOSIS — G9341 Metabolic encephalopathy: Secondary | ICD-10-CM

## 2023-10-23 DIAGNOSIS — I952 Hypotension due to drugs: Secondary | ICD-10-CM | POA: Diagnosis not present

## 2023-10-23 DIAGNOSIS — I629 Nontraumatic intracranial hemorrhage, unspecified: Secondary | ICD-10-CM | POA: Diagnosis not present

## 2023-10-23 DIAGNOSIS — J690 Pneumonitis due to inhalation of food and vomit: Secondary | ICD-10-CM | POA: Diagnosis not present

## 2023-10-23 DIAGNOSIS — R6521 Severe sepsis with septic shock: Secondary | ICD-10-CM | POA: Diagnosis not present

## 2023-10-23 DIAGNOSIS — J984 Other disorders of lung: Secondary | ICD-10-CM | POA: Diagnosis not present

## 2023-10-23 DIAGNOSIS — E860 Dehydration: Secondary | ICD-10-CM | POA: Diagnosis present

## 2023-10-23 DIAGNOSIS — E43 Unspecified severe protein-calorie malnutrition: Secondary | ICD-10-CM | POA: Diagnosis present

## 2023-10-23 DIAGNOSIS — Z789 Other specified health status: Secondary | ICD-10-CM | POA: Diagnosis not present

## 2023-10-23 DIAGNOSIS — Z7189 Other specified counseling: Secondary | ICD-10-CM | POA: Diagnosis not present

## 2023-10-23 DIAGNOSIS — D472 Monoclonal gammopathy: Secondary | ICD-10-CM | POA: Diagnosis present

## 2023-10-23 DIAGNOSIS — R918 Other nonspecific abnormal finding of lung field: Secondary | ICD-10-CM | POA: Diagnosis not present

## 2023-10-23 DIAGNOSIS — Z841 Family history of disorders of kidney and ureter: Secondary | ICD-10-CM

## 2023-10-23 DIAGNOSIS — I517 Cardiomegaly: Secondary | ICD-10-CM | POA: Diagnosis not present

## 2023-10-23 DIAGNOSIS — J9600 Acute respiratory failure, unspecified whether with hypoxia or hypercapnia: Secondary | ICD-10-CM | POA: Diagnosis not present

## 2023-10-23 DIAGNOSIS — R531 Weakness: Secondary | ICD-10-CM | POA: Diagnosis not present

## 2023-10-23 DIAGNOSIS — D696 Thrombocytopenia, unspecified: Secondary | ICD-10-CM | POA: Diagnosis not present

## 2023-10-23 DIAGNOSIS — I639 Cerebral infarction, unspecified: Secondary | ICD-10-CM | POA: Diagnosis not present

## 2023-10-23 DIAGNOSIS — I1310 Hypertensive heart and chronic kidney disease without heart failure, with stage 1 through stage 4 chronic kidney disease, or unspecified chronic kidney disease: Secondary | ICD-10-CM | POA: Diagnosis present

## 2023-10-23 DIAGNOSIS — T39015A Adverse effect of aspirin, initial encounter: Secondary | ICD-10-CM | POA: Diagnosis present

## 2023-10-23 DIAGNOSIS — K838 Other specified diseases of biliary tract: Secondary | ICD-10-CM | POA: Diagnosis not present

## 2023-10-23 DIAGNOSIS — D6959 Other secondary thrombocytopenia: Secondary | ICD-10-CM | POA: Diagnosis not present

## 2023-10-23 DIAGNOSIS — Z8249 Family history of ischemic heart disease and other diseases of the circulatory system: Secondary | ICD-10-CM

## 2023-10-23 DIAGNOSIS — J9601 Acute respiratory failure with hypoxia: Secondary | ICD-10-CM | POA: Diagnosis present

## 2023-10-23 DIAGNOSIS — Z515 Encounter for palliative care: Secondary | ICD-10-CM

## 2023-10-23 DIAGNOSIS — K3189 Other diseases of stomach and duodenum: Secondary | ICD-10-CM | POA: Diagnosis not present

## 2023-10-23 DIAGNOSIS — F17210 Nicotine dependence, cigarettes, uncomplicated: Secondary | ICD-10-CM | POA: Diagnosis present

## 2023-10-23 DIAGNOSIS — R109 Unspecified abdominal pain: Secondary | ICD-10-CM

## 2023-10-23 DIAGNOSIS — R1310 Dysphagia, unspecified: Secondary | ICD-10-CM | POA: Diagnosis present

## 2023-10-23 DIAGNOSIS — T380X5A Adverse effect of glucocorticoids and synthetic analogues, initial encounter: Secondary | ICD-10-CM | POA: Diagnosis not present

## 2023-10-23 DIAGNOSIS — E7800 Pure hypercholesterolemia, unspecified: Secondary | ICD-10-CM | POA: Diagnosis present

## 2023-10-23 DIAGNOSIS — Z452 Encounter for adjustment and management of vascular access device: Secondary | ICD-10-CM | POA: Diagnosis not present

## 2023-10-23 DIAGNOSIS — Z7951 Long term (current) use of inhaled steroids: Secondary | ICD-10-CM

## 2023-10-23 DIAGNOSIS — Z8701 Personal history of pneumonia (recurrent): Secondary | ICD-10-CM

## 2023-10-23 DIAGNOSIS — Z833 Family history of diabetes mellitus: Secondary | ICD-10-CM

## 2023-10-23 DIAGNOSIS — I700 Atherosclerosis of aorta: Secondary | ICD-10-CM | POA: Diagnosis not present

## 2023-10-23 DIAGNOSIS — R1110 Vomiting, unspecified: Secondary | ICD-10-CM | POA: Diagnosis not present

## 2023-10-23 DIAGNOSIS — N179 Acute kidney failure, unspecified: Secondary | ICD-10-CM | POA: Diagnosis not present

## 2023-10-23 DIAGNOSIS — K30 Functional dyspepsia: Secondary | ICD-10-CM | POA: Diagnosis not present

## 2023-10-23 DIAGNOSIS — R29727 NIHSS score 27: Secondary | ICD-10-CM | POA: Diagnosis not present

## 2023-10-23 DIAGNOSIS — Z823 Family history of stroke: Secondary | ICD-10-CM

## 2023-10-23 DIAGNOSIS — Z743 Need for continuous supervision: Secondary | ICD-10-CM | POA: Diagnosis not present

## 2023-10-23 DIAGNOSIS — Y92009 Unspecified place in unspecified non-institutional (private) residence as the place of occurrence of the external cause: Secondary | ICD-10-CM | POA: Diagnosis not present

## 2023-10-23 DIAGNOSIS — K315 Obstruction of duodenum: Secondary | ICD-10-CM | POA: Diagnosis present

## 2023-10-23 DIAGNOSIS — N1831 Chronic kidney disease, stage 3a: Secondary | ICD-10-CM | POA: Diagnosis present

## 2023-10-23 DIAGNOSIS — N289 Disorder of kidney and ureter, unspecified: Secondary | ICD-10-CM | POA: Diagnosis not present

## 2023-10-23 DIAGNOSIS — E119 Type 2 diabetes mellitus without complications: Secondary | ICD-10-CM | POA: Diagnosis not present

## 2023-10-23 DIAGNOSIS — Z7984 Long term (current) use of oral hypoglycemic drugs: Secondary | ICD-10-CM

## 2023-10-23 DIAGNOSIS — Z8711 Personal history of peptic ulcer disease: Secondary | ICD-10-CM

## 2023-10-23 DIAGNOSIS — E1165 Type 2 diabetes mellitus with hyperglycemia: Secondary | ICD-10-CM | POA: Diagnosis not present

## 2023-10-23 DIAGNOSIS — E042 Nontoxic multinodular goiter: Secondary | ICD-10-CM | POA: Diagnosis not present

## 2023-10-23 DIAGNOSIS — K8020 Calculus of gallbladder without cholecystitis without obstruction: Secondary | ICD-10-CM | POA: Diagnosis present

## 2023-10-23 DIAGNOSIS — Y92239 Unspecified place in hospital as the place of occurrence of the external cause: Secondary | ICD-10-CM | POA: Diagnosis not present

## 2023-10-23 DIAGNOSIS — K5730 Diverticulosis of large intestine without perforation or abscess without bleeding: Secondary | ICD-10-CM | POA: Diagnosis not present

## 2023-10-23 DIAGNOSIS — I77810 Thoracic aortic ectasia: Secondary | ICD-10-CM | POA: Diagnosis not present

## 2023-10-23 DIAGNOSIS — K311 Adult hypertrophic pyloric stenosis: Principal | ICD-10-CM | POA: Diagnosis present

## 2023-10-23 DIAGNOSIS — Z79899 Other long term (current) drug therapy: Secondary | ICD-10-CM

## 2023-10-23 DIAGNOSIS — Z0389 Encounter for observation for other suspected diseases and conditions ruled out: Secondary | ICD-10-CM | POA: Diagnosis not present

## 2023-10-23 DIAGNOSIS — F17200 Nicotine dependence, unspecified, uncomplicated: Secondary | ICD-10-CM | POA: Diagnosis not present

## 2023-10-23 DIAGNOSIS — Z961 Presence of intraocular lens: Secondary | ICD-10-CM | POA: Diagnosis present

## 2023-10-23 DIAGNOSIS — Z9842 Cataract extraction status, left eye: Secondary | ICD-10-CM

## 2023-10-23 DIAGNOSIS — K2980 Duodenitis without bleeding: Secondary | ICD-10-CM | POA: Diagnosis not present

## 2023-10-23 DIAGNOSIS — Z4682 Encounter for fitting and adjustment of non-vascular catheter: Secondary | ICD-10-CM | POA: Diagnosis not present

## 2023-10-23 DIAGNOSIS — J90 Pleural effusion, not elsewhere classified: Secondary | ICD-10-CM | POA: Diagnosis not present

## 2023-10-23 DIAGNOSIS — R0989 Other specified symptoms and signs involving the circulatory and respiratory systems: Secondary | ICD-10-CM | POA: Diagnosis not present

## 2023-10-23 DIAGNOSIS — E1122 Type 2 diabetes mellitus with diabetic chronic kidney disease: Secondary | ICD-10-CM | POA: Diagnosis present

## 2023-10-23 DIAGNOSIS — Z7901 Long term (current) use of anticoagulants: Secondary | ICD-10-CM

## 2023-10-23 DIAGNOSIS — I7121 Aneurysm of the ascending aorta, without rupture: Secondary | ICD-10-CM | POA: Diagnosis not present

## 2023-10-23 DIAGNOSIS — Z716 Tobacco abuse counseling: Secondary | ICD-10-CM

## 2023-10-23 DIAGNOSIS — G9340 Encephalopathy, unspecified: Secondary | ICD-10-CM | POA: Diagnosis not present

## 2023-10-23 DIAGNOSIS — I2510 Atherosclerotic heart disease of native coronary artery without angina pectoris: Secondary | ICD-10-CM | POA: Diagnosis present

## 2023-10-23 DIAGNOSIS — R748 Abnormal levels of other serum enzymes: Secondary | ICD-10-CM | POA: Diagnosis present

## 2023-10-23 DIAGNOSIS — Z813 Family history of other psychoactive substance abuse and dependence: Secondary | ICD-10-CM

## 2023-10-23 DIAGNOSIS — Z6826 Body mass index (BMI) 26.0-26.9, adult: Secondary | ICD-10-CM

## 2023-10-23 DIAGNOSIS — G8929 Other chronic pain: Secondary | ICD-10-CM | POA: Diagnosis present

## 2023-10-23 DIAGNOSIS — J432 Centrilobular emphysema: Secondary | ICD-10-CM | POA: Diagnosis not present

## 2023-10-23 DIAGNOSIS — R930 Abnormal findings on diagnostic imaging of skull and head, not elsewhere classified: Secondary | ICD-10-CM | POA: Diagnosis not present

## 2023-10-23 DIAGNOSIS — N281 Cyst of kidney, acquired: Secondary | ICD-10-CM | POA: Diagnosis not present

## 2023-10-23 DIAGNOSIS — T18128A Food in esophagus causing other injury, initial encounter: Secondary | ICD-10-CM | POA: Diagnosis not present

## 2023-10-23 DIAGNOSIS — K56609 Unspecified intestinal obstruction, unspecified as to partial versus complete obstruction: Secondary | ICD-10-CM | POA: Diagnosis not present

## 2023-10-23 DIAGNOSIS — R933 Abnormal findings on diagnostic imaging of other parts of digestive tract: Secondary | ICD-10-CM | POA: Diagnosis not present

## 2023-10-23 DIAGNOSIS — R1114 Bilious vomiting: Secondary | ICD-10-CM | POA: Diagnosis not present

## 2023-10-23 DIAGNOSIS — J9811 Atelectasis: Secondary | ICD-10-CM | POA: Diagnosis not present

## 2023-10-23 DIAGNOSIS — Z888 Allergy status to other drugs, medicaments and biological substances status: Secondary | ICD-10-CM

## 2023-10-23 DIAGNOSIS — R0603 Acute respiratory distress: Secondary | ICD-10-CM | POA: Diagnosis not present

## 2023-10-23 DIAGNOSIS — Z471 Aftercare following joint replacement surgery: Secondary | ICD-10-CM | POA: Diagnosis not present

## 2023-10-23 DIAGNOSIS — Z9841 Cataract extraction status, right eye: Secondary | ICD-10-CM

## 2023-10-23 DIAGNOSIS — F419 Anxiety disorder, unspecified: Secondary | ICD-10-CM | POA: Diagnosis present

## 2023-10-23 LAB — CBC WITH DIFFERENTIAL/PLATELET
Abs Immature Granulocytes: 0.02 10*3/uL (ref 0.00–0.07)
Basophils Absolute: 0 10*3/uL (ref 0.0–0.1)
Basophils Relative: 0 %
Eosinophils Absolute: 0 10*3/uL (ref 0.0–0.5)
Eosinophils Relative: 0 %
HCT: 35.2 % — ABNORMAL LOW (ref 39.0–52.0)
Hemoglobin: 11.1 g/dL — ABNORMAL LOW (ref 13.0–17.0)
Immature Granulocytes: 0 %
Lymphocytes Relative: 18 %
Lymphs Abs: 1.1 10*3/uL (ref 0.7–4.0)
MCH: 28.2 pg (ref 26.0–34.0)
MCHC: 31.5 g/dL (ref 30.0–36.0)
MCV: 89.6 fL (ref 80.0–100.0)
Monocytes Absolute: 0.6 10*3/uL (ref 0.1–1.0)
Monocytes Relative: 9 %
Neutro Abs: 4.3 10*3/uL (ref 1.7–7.7)
Neutrophils Relative %: 73 %
Platelets: 332 10*3/uL (ref 150–400)
RBC: 3.93 MIL/uL — ABNORMAL LOW (ref 4.22–5.81)
RDW: 16.5 % — ABNORMAL HIGH (ref 11.5–15.5)
WBC: 6 10*3/uL (ref 4.0–10.5)
nRBC: 0 % (ref 0.0–0.2)

## 2023-10-23 LAB — LIPASE, BLOOD: Lipase: 84 U/L — ABNORMAL HIGH (ref 11–51)

## 2023-10-23 LAB — COMPREHENSIVE METABOLIC PANEL
ALT: 17 U/L (ref 0–44)
AST: 14 U/L — ABNORMAL LOW (ref 15–41)
Albumin: 3.8 g/dL (ref 3.5–5.0)
Alkaline Phosphatase: 36 U/L — ABNORMAL LOW (ref 38–126)
Anion gap: 15 (ref 5–15)
BUN: 20 mg/dL (ref 8–23)
CO2: 26 mmol/L (ref 22–32)
Calcium: 10.3 mg/dL (ref 8.9–10.3)
Chloride: 91 mmol/L — ABNORMAL LOW (ref 98–111)
Creatinine, Ser: 1.53 mg/dL — ABNORMAL HIGH (ref 0.61–1.24)
GFR, Estimated: 48 mL/min — ABNORMAL LOW (ref >60)
Glucose, Bld: 151 mg/dL — ABNORMAL HIGH (ref 70–99)
Potassium: 3.1 mmol/L — ABNORMAL LOW (ref 3.5–5.1)
Sodium: 132 mmol/L — ABNORMAL LOW (ref 135–145)
Total Bilirubin: 1 mg/dL (ref <1.2)
Total Protein: 7.3 g/dL (ref 6.5–8.1)

## 2023-10-23 LAB — LACTIC ACID, PLASMA: Lactic Acid, Venous: 1.1 mmol/L (ref 0.5–1.9)

## 2023-10-23 LAB — LACTATE DEHYDROGENASE: LDH: 122 U/L (ref 98–192)

## 2023-10-23 MED ORDER — FENTANYL CITRATE PF 50 MCG/ML IJ SOSY
25.0000 ug | PREFILLED_SYRINGE | INTRAMUSCULAR | Status: DC | PRN
  Administered 2023-10-24 – 2023-11-01 (×8): 25 ug via INTRAVENOUS
  Filled 2023-10-23 (×8): qty 1

## 2023-10-23 MED ORDER — BUSPIRONE HCL 5 MG PO TABS
7.5000 mg | ORAL_TABLET | Freq: Three times a day (TID) | ORAL | Status: DC
  Administered 2023-10-23: 7.5 mg via ORAL
  Filled 2023-10-23 (×2): qty 1

## 2023-10-23 MED ORDER — TAMSULOSIN HCL 0.4 MG PO CAPS: 0.4000 mg | ORAL_CAPSULE | Freq: Every day | ORAL | Status: DC

## 2023-10-23 MED ORDER — ARFORMOTEROL TARTRATE 15 MCG/2ML IN NEBU
15.0000 ug | INHALATION_SOLUTION | Freq: Two times a day (BID) | RESPIRATORY_TRACT | Status: DC
  Administered 2023-10-23 – 2023-11-09 (×34): 15 ug via RESPIRATORY_TRACT
  Filled 2023-10-23 (×34): qty 2

## 2023-10-23 MED ORDER — PANTOPRAZOLE SODIUM 40 MG IV SOLR
40.0000 mg | Freq: Once | INTRAVENOUS | Status: AC
  Administered 2023-10-23: 40 mg via INTRAVENOUS
  Filled 2023-10-23: qty 10

## 2023-10-23 MED ORDER — POTASSIUM CHLORIDE 10 MEQ/100ML IV SOLN
10.0000 meq | INTRAVENOUS | Status: AC
Start: 2023-10-23 — End: 2023-10-23
  Administered 2023-10-23 (×2): 10 meq via INTRAVENOUS
  Filled 2023-10-23 (×2): qty 100

## 2023-10-23 MED ORDER — ACETAMINOPHEN 650 MG RE SUPP
650.0000 mg | Freq: Four times a day (QID) | RECTAL | Status: DC | PRN
Start: 2023-10-23 — End: 2023-10-26

## 2023-10-23 MED ORDER — POTASSIUM CHLORIDE IN NACL 20-0.9 MEQ/L-% IV SOLN: INTRAVENOUS | Status: AC

## 2023-10-23 MED ORDER — PROCHLORPERAZINE EDISYLATE 10 MG/2ML IJ SOLN
10.0000 mg | Freq: Four times a day (QID) | INTRAMUSCULAR | Status: DC | PRN
  Administered 2023-11-06: 10 mg via INTRAVENOUS
  Filled 2023-10-23 (×2): qty 2

## 2023-10-23 MED ORDER — IPRATROPIUM-ALBUTEROL 0.5-2.5 (3) MG/3ML IN SOLN
3.0000 mL | Freq: Three times a day (TID) | RESPIRATORY_TRACT | Status: DC
  Administered 2023-10-24 – 2023-11-09 (×47): 3 mL via RESPIRATORY_TRACT
  Filled 2023-10-23 (×47): qty 3

## 2023-10-23 MED ORDER — HEPARIN BOLUS VIA INFUSION
5000.0000 [IU] | Freq: Once | INTRAVENOUS | Status: AC
Start: 2023-10-23 — End: 2023-10-23
  Administered 2023-10-23: 5000 [IU] via INTRAVENOUS

## 2023-10-23 MED ORDER — IPRATROPIUM-ALBUTEROL 0.5-2.5 (3) MG/3ML IN SOLN
3.0000 mL | Freq: Three times a day (TID) | RESPIRATORY_TRACT | Status: DC
  Administered 2023-10-23: 3 mL via RESPIRATORY_TRACT
  Filled 2023-10-23: qty 3

## 2023-10-23 MED ORDER — ACETAMINOPHEN 325 MG PO TABS: 650.0000 mg | ORAL_TABLET | Freq: Four times a day (QID) | ORAL | Status: DC | PRN

## 2023-10-23 MED ORDER — PANTOPRAZOLE SODIUM 40 MG IV SOLR
40.0000 mg | Freq: Two times a day (BID) | INTRAVENOUS | Status: DC
  Administered 2023-10-23 – 2023-11-08 (×33): 40 mg via INTRAVENOUS
  Filled 2023-10-23 (×33): qty 10

## 2023-10-23 MED ORDER — HEPARIN (PORCINE) 25000 UT/250ML-% IV SOLN
1600.0000 [IU]/h | INTRAVENOUS | Status: AC
  Administered 2023-10-23: 1450 [IU]/h via INTRAVENOUS
  Filled 2023-10-23: qty 250

## 2023-10-23 MED ORDER — MONTELUKAST SODIUM 10 MG PO TABS
10.0000 mg | ORAL_TABLET | Freq: Every day | ORAL | Status: DC
  Administered 2023-10-23: 10 mg via ORAL
  Filled 2023-10-23: qty 1

## 2023-10-23 MED ORDER — LACTATED RINGERS IV BOLUS
1000.0000 mL | Freq: Once | INTRAVENOUS | Status: AC
  Administered 2023-10-23: 1000 mL via INTRAVENOUS

## 2023-10-23 MED ORDER — SUCRALFATE 1 GM/10ML PO SUSP
1.0000 g | Freq: Three times a day (TID) | ORAL | Status: DC
  Administered 2023-10-23: 1 g via ORAL
  Filled 2023-10-23 (×2): qty 10

## 2023-10-23 MED ORDER — FENTANYL CITRATE PF 50 MCG/ML IJ SOSY
50.0000 ug | PREFILLED_SYRINGE | Freq: Once | INTRAMUSCULAR | Status: AC
  Administered 2023-10-23: 50 ug via INTRAVENOUS
  Filled 2023-10-23 (×2): qty 1

## 2023-10-23 MED ORDER — RISPERIDONE 1 MG PO TABS
1.0000 mg | ORAL_TABLET | Freq: Every day | ORAL | Status: DC
  Administered 2023-10-23: 1 mg via ORAL
  Filled 2023-10-23: qty 1

## 2023-10-23 MED ORDER — BUDESONIDE 0.5 MG/2ML IN SUSP
0.5000 mg | Freq: Two times a day (BID) | RESPIRATORY_TRACT | Status: DC
  Administered 2023-10-23 – 2023-11-09 (×34): 0.5 mg via RESPIRATORY_TRACT
  Filled 2023-10-23 (×34): qty 2

## 2023-10-23 MED ORDER — IOHEXOL 300 MG/ML  SOLN
100.0000 mL | Freq: Once | INTRAMUSCULAR | Status: AC | PRN
  Administered 2023-10-23: 100 mL via INTRAVENOUS

## 2023-10-23 NOTE — Progress Notes (Signed)
PHARMACY - ANTICOAGULATION CONSULT NOTE  Pharmacy Consult for heparin Indication:  VTE treatment  Allergies  Allergen Reactions   Asa [Aspirin] Other (See Comments)    Stomach pain   Crestor [Rosuvastatin] Other (See Comments)    Stomach pain   Wellbutrin [Bupropion] Other (See Comments)    Stomach pain   Zoloft [Sertraline Hcl] Other (See Comments)    Stomach pain    Patient Measurements: Height: 6' (182.9 cm) Weight: 88 kg (194 lb) IBW/kg (Calculated) : 77.6 Heparin Dosing Weight: 88 kg  Vital Signs: Temp: 98.6 F (37 C) (11/05 1346) Temp Source: Oral (11/05 1346) BP: 138/94 (11/05 1346) Pulse Rate: 102 (11/05 1346)  Labs: Recent Labs    10/23/23 1000  HGB 11.1*  HCT 35.2*  PLT 332  CREATININE 1.53*    Estimated Creatinine Clearance: 47.2 mL/min (A) (by C-G formula based on SCr of 1.53 mg/dL (H)).   Medical History: Past Medical History:  Diagnosis Date   Acute blood loss anemia 01/05/2020   Acute respiratory failure with hypoxia (HCC)    Arthritis    COPD (chronic obstructive pulmonary disease) (HCC)    Depression    Diabetes mellitus    Diabetes mellitus without complication (HCC)    Diverticulitis    Elevated PSA 10/01/2016   Encounter for support and coordination of transition of care 01/14/2020   Hypercholesterolemia    Hyperlipidemia    Hypertension    Insomnia 01/05/2016   Ischemic colitis (HCC) 01/05/2020   Multiple lung nodules on CT 04/02/2015   Nicotine addiction    Obesity    Oxygen deficiency    qhs   Schizophrenia (HCC)     Medications:  (Not in a hospital admission)   Assessment: Pharmacy consulted to dose heparin in patient with possible portal vein thrombosis/pulmonary embolism. Patient is not on anticoagulation prior to admission.  CBC WNL  Goal of Therapy:  Heparin level 0.3-0.7 units/ml Monitor platelets by anticoagulation protocol: Yes   Plan:  Give 5000 units bolus x 1 Start heparin infusion at 1450  units/hr Check anti-Xa level in 8 hours and daily while on heparin Continue to monitor H&H and platelets  Judeth Cornfield, PharmD Clinical Pharmacist 10/23/2023 2:27 PM

## 2023-10-23 NOTE — Hospital Course (Addendum)
72 year old male with a history of IgG MGUS, COPD, diabetes mellitus type 2, hypertension, hyperlipidemia, depression, tobacco abuse, coronary disease presenting with generalized weakness, abdominal pain, nausea and vomiting. The patient was recently admitted to the hospital from 09/30/2023 to 10/03/2023.  At that time, the patient had nausea and vomiting and poor oral intake and upper abdominal pain as well.  The patient was treated for sepsis secondary to aspiration pneumonia.  GI was consulted during the admission.  The patient underwent EGD on 10/02/2023 which showed grade B esophagitis without bleeding, nonbleeding gastric ulcers with a clean base, and duodenitis.  The patient was discharged home with PPI twice daily and sulcrafate.  The patient states that he was feeling well after d/c and he was tolerating a soft diet. However, he but began having gradual abdominal pain and belching in the past 1 week.  He states that things have gradually worsened over the past 3 days prior to admission with increasing belching and hiccups that has resulted in vomiting every time he belches.  He has had poor oral intake and progressive generalized weakness.  He also complains of worsening mid abdominal pain.  He denies f/c, cp, sob, hemoptysis, diarrhea, hematochezia, melena, dysuria, hematuria.  As result of abd pain with n/v, the patient presented for further evaluation and treatment.  He continues to smoke a few cigarette's a day. Notably, patient states he ate spaghetti and chocolate chip cookies before he came to ED and did not have any emesis.   In the ED, the patient was afebrile hemodynamically stable with oxygen saturation 92% on room air.  WBC 6.0, hemoglobin 11.1, platelets 332.  Sodium 132, potassium 3.1, bicarbonate 28, serum creatinine 1.53.  LFTs were unremarkable.  Lipase 84.  CT of the abdomen and pelvis showed marked distention of the stomach to the level of the duodenum where there is circumferential  thickening and heterogenous hypoattenuation at the site of the patient's previous duodenitis. There was a new hypodensity upstream portal vein suspicious for nonocclusive thrombus.  There is also a partially imaged hypodensity in the right upper lobe pulmonary artery which may represent artifact versus PE. The patient was started on IV heparin. -GI was consulted to assist with management. The patient subsequently underwent an open gastrojejunostomy on 10/26/2023.  His postoperative course was fairly unremarkable and the patient was started on TPN for short period of time.  This was discontinued on 10/31/2023.  On the evening of 10/31/2023 the patient developed respiratory distress and declining mental status.  He was moved to the stepdown unit.

## 2023-10-23 NOTE — Consult Note (Signed)
@LOGO @   Referring Provider: Ma Rings, PA-C  Primary Care Physician:  Kerri Perches, MD Primary Gastroenterologist:  Dr. Marletta Lor  Date of Admission: 10/23/2023 Date of Consultation: 10/23/2023  Reason for Consultation: Concern for gastric outlet obstruction  HPI:  Matthew Molina is a 72 y.o. year old male with history of COPD, diabetes, ischemic colitis in 2021, HLD, HTN, depression, chronic epigastric pain, constipation, esophageal wall thickening, duodenitis noted on imaging this year, recent admission earlier this month with pneumonia as well as acute on chronic abdominal pain, nausea, vomiting with concerns for gastric outlet obstruction in the setting of duodenal inflammation on CT.  EGD completed 10/02/2023 showing esophagitis, few nonbleeding superficial gastric ulcers in the gastric body biopsied, some deformity in the gastric body but no obstruction in the gastric outlet, duodenitis in the second portion of the duodenum with visualization difficult due to presence of food and angulation of the duodenum with recommendations to repeat EGD in 1-2 weeks, maintain on PPI twice daily and Carafate  He has now returning to the emergency room today with decreased p.o. intake, worsening abdominal pain, weakness.  ED Course:  Hemodynamically stable  Hemoglobin 11.1, sodium 132, potassium 3.1, bicarbonate 28, serum creatinine 1.53. LFTs were unremarkable. Lipase 84.  CT of the abdomen and pelvis showed marked distention of the stomach to the level of the duodenum where there is circumferential thickening and heterogenous hypoattenuation at the site of the patient's previous duodenitis. There was a new hypodensity upstream portal vein suspicious for nonocclusive thrombus.  There is also a partially imaged hypodensity in the right upper lobe pulmonary artery which may represent artifact versus PE. Liver Doppler has been ordered.  IV heparin has been ordered and GI consulted to assist  with management.   Consult:  Reports he did ok for few days after recent hospitalization, but then he started having progressive trouble with eating over the last week or so, reporting mid abdominal pain postprandially and also developing food regurgitation with belching but no true vomiting.  Feels foods are sitting in upper abdomen. Reports compliance with PPI twice daily and no real heartburn symptoms. He has also been compliant with Carafate 4 times daily.  Unfortunately, he has continued taking BC powders daily until about 4 days ago.  No BRBPR or melena or hematemesis.    Past Medical History:  Diagnosis Date   Acute blood loss anemia 01/05/2020   Acute respiratory failure with hypoxia (HCC)    Arthritis    COPD (chronic obstructive pulmonary disease) (HCC)    Depression    Diabetes mellitus    Diabetes mellitus without complication (HCC)    Diverticulitis    Elevated PSA 10/01/2016   Encounter for support and coordination of transition of care 01/14/2020   Hypercholesterolemia    Hyperlipidemia    Hypertension    Insomnia 01/05/2016   Ischemic colitis (HCC) 01/05/2020   Multiple lung nodules on CT 04/02/2015   Nicotine addiction    Obesity    Oxygen deficiency    qhs   Schizophrenia [facility])     Past Surgical History:  Procedure Laterality Date   BIOPSY  05/28/2023   Procedure: BIOPSY;  Surgeon: Lanelle Bal, DO;  [facility];  Service: Endoscopy;;   BIOPSY  10/02/2023   Procedure: BIOPSY;  Surgeon: Dolores Frame, MD;  [facility];  Service: Gastroenterology;;   CATARACT EXTRACTION W/PHACO Left 11/20/2013   Procedure: CATARACT EXTRACTION PHACO AND INTRAOCULAR LENS PLACEMENT (IOC);  Surgeon: Gemma Payor, MD;  Location: [facility];  Service: Ophthalmology;  Laterality: Left;  CDE:10.26   CATARACT EXTRACTION W/PHACO Right 12/08/2013   Procedure: RIGHT EYE CATARACT EXTRACTION PHACO AND INTRAOCULAR LENS PLACEMENT ;  Surgeon: Gemma Payor, MD;   Location: [facility];  Service: Ophthalmology;  Laterality: Right;  CDE 12.38   COLONOSCOPY N/A 04/01/2013   pancolonic diverticulosis, redundant colon, large internal hemorrhoids.    COLONOSCOPY  2014   INCOMPLETE PREP IN R COLON   COLONOSCOPY N/A 04/01/2020   Procedure: COLONOSCOPY;  Surgeon: West Bali, MD;  [facility];  Service: Endoscopy;  Laterality: N/A;  8:45am   ESOPHAGOGASTRODUODENOSCOPY N/A 02/18/2016   stricture at GE junction, moderate erosive gastritis and mild non-erosive duodenitis. +H.pylori. Treated initially with Prevpac. Breath test to check for eradication was positive, and he was prescribed Pylera.    ESOPHAGOGASTRODUODENOSCOPY (EGD) WITH PROPOFOL N/A 05/28/2023   Procedure: ESOPHAGOGASTRODUODENOSCOPY (EGD) WITH PROPOFOL;  Surgeon: Lanelle Bal, DO;  [facility];  Service: Endoscopy;  Laterality: N/A;  11:15am, asa 3   ESOPHAGOGASTRODUODENOSCOPY (EGD) WITH PROPOFOL N/A 10/02/2023   Procedure: ESOPHAGOGASTRODUODENOSCOPY (EGD) WITH PROPOFOL;  Surgeon: Dolores Frame, MD;  [facility];  Service: Gastroenterology;  Laterality: N/A;   EYE SURGERY Left 11/2013   cataract extraction   POLYPECTOMY  04/01/2020   Procedure: POLYPECTOMY;  Surgeon: West Bali, MD;  [facility];  Service: Endoscopy;;   SAVORY DILATION N/A 02/18/2016   Procedure: SAVORY DILATION;  Surgeon: West Bali, MD;  [facility];  Service: Endoscopy;  Laterality: N/A;   SHOULDER SURGERY Right     Prior to Admission medications   Medication Sig Start Date End Date Taking? Authorizing Provider  glipiZIDE (GLUCOTROL XL) 10 MG 24 hr tablet Take 20 mg by mouth every morning. 10/17/23  Yes [provider]  rosuvastatin (CRESTOR) 10 MG tablet Take 10 mg by mouth daily. 10/10/23  Yes [provider]  ACCU-CHEK GUIDE test strip USE TO CHECK BLOOD SUGAR ONCE DAILY 07/31/23   Kerri Perches, MD  Accu-Chek Softclix  Lancets lancets TEST BLOOD SUGAR ONCE DAILY AS DIRECTED. 10/19/23   Kerri Perches, MD  acetaminophen (TYLENOL) 500 MG tablet Take 1,000 mg by mouth 2 (two) times daily as needed for headache, moderate pain or fever.    [provider]  albuterol (VENTOLIN HFA) 108 (90 Base) MCG/ACT inhaler INHALE 2 PUFFS INTO THE LUNGS EVERY 6 HOURS AS NEEDED FOR WHEEZING OR SHORTNESS OF BREATH. 10/17/23   Kerri Perches, MD  busPIRone (BUSPAR) 7.5 MG tablet Take 7.5 mg by mouth 3 (three) times daily.    [provider]  FARXIGA 5 MG TABS tablet Take 5 mg by mouth daily. 07/27/22   [provider]  fenofibrate (TRICOR) 145 MG tablet Take 1 tablet (145 mg total) by mouth daily. 09/17/23   Kerri Perches, MD  ipratropium-albuterol (DUONEB) 0.5-2.5 (3) MG/3ML SOLN INHALE 1 VIAL VIA NEBULIZER EVERY 4 TO 6 HOURS. Patient taking differently: Take 3 mLs by nebulization 2 (two) times daily. 06/27/23   Kerri Perches, MD  linaclotide [facility]) 145 MCG CAPS capsule Take 1 capsule (145 mcg total) by mouth daily before breakfast. 08/08/23   Aida Raider, NP  montelukast (SINGULAIR) 10 MG tablet TAKE (1) TABLET BY MOUTH AT BEDTIME. 08/21/23   Kerri Perches, MD  Multiple Vitamins-Minerals (MULTIVITAMIN WITH MINERALS) tablet Take 1 tablet by mouth daily.    [provider]  ondansetron (ZOFRAN-ODT) 4 MG disintegrating tablet DISSOLVE 1 TABLET ON TONGUE ONCE DAILY AS NEEDED. 09/13/23   Aida Raider, NP  pantoprazole (PROTONIX) 40 MG tablet Take 1 tablet (40 mg total) by mouth 2 (two) times daily. 10/03/23 10/02/24  Sherryll Burger, Pratik D, DO  risperiDONE (RISPERDAL) 1 MG tablet Take 1 tablet (1 mg total) by mouth at bedtime. 09/26/23   Kerri Perches, MD  risperidone (RISPERDAL) 4 MG tablet TAKE ONE TABLET BY MOUTH IN THE EVENING. TAKE WITH 1MG  TABLET. Patient taking differently: Take 4 mg by mouth at bedtime. 07/20/23   Kerri Perches, MD  sucralfate (CARAFATE) 1  GM/10ML suspension Take 10 mLs (1 g total) by mouth 4 (four) times daily -  with meals and at bedtime. 10/16/23   Aida Raider, NP  SYMBICORT 160-4.5 MCG/ACT inhaler INHALE 2 PUFFS INTO THE LUNGS ONCE IN THE MORNING AND AT BEDTIME. 10/17/23   Kerri Perches, MD  tamsulosin (FLOMAX) 0.4 MG CAPS capsule Take 1 capsule (0.4 mg total) by mouth daily after supper. 09/12/23   McKenzie, Mardene Celeste, MD  traZODone (DESYREL) 50 MG tablet TAKE ONE TABLET BY MOUTH AT BEDTIME AS NEEDED FOR SLEEP. 10/17/23   Kerri Perches, MD  UNABLE TO FIND Med Name: walking cane 10/12/23   Kerri Perches, MD  sildenafil (VIAGRA) 100 MG tablet Take 100 mg by mouth. Take one tablet 30 mins before intercourse   03/11/12  [provider]    Current Facility-Administered Medications  Medication Dose Route Frequency Provider Last Rate Last Admin   heparin ADULT infusion 100 units/mL (25000 units/264mL)  1,450 Units/hr Intravenous Continuous Kommor, Madison, MD       heparin bolus via infusion 5,000 Units  5,000 Units Intravenous Once Kommor, Madison, MD       pantoprazole (PROTONIX) injection 40 mg  40 mg Intravenous Once Beatty, Celeste A, PA-C       potassium chloride 10 mEq in 100 mL IVPB  10 mEq Intravenous Q1 Hr x 2 Beatty, Celeste A, PA-C       Current Outpatient Medications  Medication Sig Dispense Refill   glipiZIDE (GLUCOTROL XL) 10 MG 24 hr tablet Take 20 mg by mouth every morning.     rosuvastatin (CRESTOR) 10 MG tablet Take 10 mg by mouth daily.     ACCU-CHEK GUIDE test strip USE TO CHECK BLOOD SUGAR ONCE DAILY 100 strip 0   Accu-Chek Softclix Lancets lancets TEST BLOOD SUGAR ONCE DAILY AS DIRECTED. 100 each 5   acetaminophen (TYLENOL) 500 MG tablet Take 1,000 mg by mouth 2 (two) times daily as needed for headache, moderate pain or fever.     albuterol (VENTOLIN HFA) 108 (90 Base) MCG/ACT inhaler INHALE 2 PUFFS INTO THE LUNGS EVERY 6 HOURS AS NEEDED FOR WHEEZING OR SHORTNESS OF BREATH. 8.5  g 0   busPIRone (BUSPAR) 7.5 MG tablet Take 7.5 mg by mouth 3 (three) times daily.     FARXIGA 5 MG TABS tablet Take 5 mg by mouth daily.     fenofibrate (TRICOR) 145 MG tablet Take 1 tablet (145 mg total) by mouth daily. 30 tablet 3   ipratropium-albuterol (DUONEB) 0.5-2.5 (3) MG/3ML SOLN INHALE 1 VIAL VIA NEBULIZER EVERY 4 TO 6 HOURS. (Patient taking differently: Take 3 mLs by nebulization 2 (two) times daily.) 540 mL 3   linaclotide (LINZESS) 145 MCG CAPS capsule Take 1 capsule (145 mcg total) by mouth daily before breakfast. 30 capsule 3  montelukast (SINGULAIR) 10 MG tablet TAKE (1) TABLET BY MOUTH AT BEDTIME. 30 tablet 0   Multiple Vitamins-Minerals (MULTIVITAMIN WITH MINERALS) tablet Take 1 tablet by mouth daily.     ondansetron (ZOFRAN-ODT) 4 MG disintegrating tablet DISSOLVE 1 TABLET ON TONGUE ONCE DAILY AS NEEDED. 20 tablet 0   pantoprazole (PROTONIX) 40 MG tablet Take 1 tablet (40 mg total) by mouth 2 (two) times daily. 60 tablet 1   risperiDONE (RISPERDAL) 1 MG tablet Take 1 tablet (1 mg total) by mouth at bedtime. 90 tablet 1   risperidone (RISPERDAL) 4 MG tablet TAKE ONE TABLET BY MOUTH IN THE EVENING. TAKE WITH 1MG  TABLET. (Patient taking differently: Take 4 mg by mouth at bedtime.) 90 tablet 0   sucralfate (CARAFATE) 1 GM/10ML suspension Take 10 mLs (1 g total) by mouth 4 (four) times daily -  with meals and at bedtime. 420 mL 1   SYMBICORT 160-4.5 MCG/ACT inhaler INHALE 2 PUFFS INTO THE LUNGS ONCE IN THE MORNING AND AT BEDTIME. 10.2 g 0   tamsulosin (FLOMAX) 0.4 MG CAPS capsule Take 1 capsule (0.4 mg total) by mouth daily after supper. 90 capsule 3   traZODone (DESYREL) 50 MG tablet TAKE ONE TABLET BY MOUTH AT BEDTIME AS NEEDED FOR SLEEP. 90 tablet 0   UNABLE TO FIND Med Name: walking cane 1 each 0    Allergies as of 10/23/2023 - Reviewed 10/23/2023  Allergen Reaction Noted   Asa [aspirin] Other (See Comments) 09/30/2023   Crestor [rosuvastatin] Other (See Comments) 09/30/2023    Wellbutrin [bupropion] Other (See Comments) 07/29/2015   Zoloft [sertraline hcl] Other (See Comments) 11/05/2015    Family History  Problem Relation Age of Onset   Diabetes Mother    Hypertension Mother    Stroke Mother    Diabetes Sister    Heart disease Sister    Kidney disease Father    Drug abuse Brother    Colon cancer Neg Hx    Colon polyps Neg Hx     Social History   Socioeconomic History   Marital status: Divorced    Spouse name: Not on file   Number of children: 2   Years of education: Not on file   Highest education level: Not on file  Occupational History   Occupation: retired from Theatre stage manager   Tobacco Use   Smoking status: Every Day    Current packs/day: 1.00    Average packs/day: 1 pack/day for 48.0 years (48.0 ttl pk-yrs)    Types: Cigarettes   Smokeless tobacco: Never  Vaping Use   Vaping status: Never Used  Substance and Sexual Activity   Alcohol use: Yes    Alcohol/week: 0.0 standard drinks of alcohol    Comment: "little bit of beer"   Drug use: No   Sexual activity: Not Currently    Birth control/protection: None  Other Topics Concern   Not on file  Social History Narrative   ** Merged History Encounter **       ** Merged History Encounter **       Social Determinants of Health   Financial Resource Strain: Low Risk  (12/13/2022)   Overall Financial Resource Strain (CARDIA)    Difficulty of Paying Living Expenses: Not very hard  Food Insecurity: No Food Insecurity (10/09/2023)   Hunger Vital Sign    Worried About Running Out of Food in the Last Year: Never true    Ran Out of Food in the Last Year: Never true  Transportation Needs: No Transportation  Needs (10/09/2023)   PRAPARE - Administrator, Civil Service (Medical): No    Lack of Transportation (Non-Medical): No  Physical Activity: Inactive (11/27/2022)   Exercise Vital Sign    Days of Exercise per Week: 0 days    Minutes of Exercise per Session: 0 min  Stress: No Stress  Concern Present (11/27/2022)   [employer] of Occupational Health - Occupational Stress Questionnaire    Feeling of Stress : Not at all  Social Connections: Moderately Isolated (11/27/2022)   Social Connection and Isolation Panel [NHANES]    Frequency of Communication with Friends and Family: More than three times a week    Frequency of Social Gatherings with Friends and Family: More than three times a week    Attends Religious Services: More than 4 times per year    Active Member of [employer] or Organizations: No    Attends Banker Meetings: Never    Marital Status: Divorced  Catering manager Violence: Not At Risk (11/27/2022)   Humiliation, Afraid, Rape, and Kick questionnaire    Fear of Current or Ex-Partner: No    Emotionally Abused: No    Physically Abused: No    Sexually Abused: No    Review of Systems: Gen: Denies fever, chills, cold or flulike symptoms, presyncope, syncope. CV: Denies chest pain, heart palpitations. Resp: Denies shortness of breath, cough. GI: See HPI GU : Denies urinary burning, urinary frequency, urinary incontinence.  MS: Admits to chronic back pain. Heme: See HPI  Physical Exam: Vital signs in last 24 hours: Temp:  [98.2 F (36.8 C)-98.6 F (37 C)] 98.6 F (37 C) (11/05 1346) Pulse Rate:  [102-110] 102 (11/05 1346) Resp:  [16-24] 16 (11/05 1346) BP: (129-138)/(94-96) 138/94 (11/05 1346) SpO2:  [92 %] 92 % (11/05 1346) Weight:  [88 kg] 88 kg (11/05 0909)   General:   Alert,  Well-developed, well-nourished, pleasant and cooperative in NAD Head:  Normocephalic and atraumatic. Eyes:  Sclera clear, no icterus.   Conjunctiva pink. Ears:  Normal auditory acuity. Lungs:  Clear throughout to auscultation.   No wheezes, crackles, or rhonchi. No acute distress. Heart:  Regular rate and rhythm; no murmurs, clicks, rubs,  or gallops. Abdomen:  Full but soft. Mild TTP in epigastric and periumbilical area.  Normal bowel sounds, without  guarding, and without rebound.   Rectal:  Deferred  Msk:  Symmetrical without gross deformities. Normal posture. Extremities:  Without edema. Neurologic:  Alert and  oriented  Skin:  Intact without significant lesions or rashes. Psych: Normal mood and affect.  Intake/Output from previous day: No intake/output data recorded. Intake/Output this shift: No intake/output data recorded.  Lab Results: Recent Labs    10/23/23 1000  WBC 6.0  HGB 11.1*  HCT 35.2*  PLT 332   BMET Recent Labs    10/23/23 1000  NA 132*  K 3.1*  CL 91*  CO2 26  GLUCOSE 151*  BUN 20  CREATININE 1.53*  CALCIUM 10.3   LFT Recent Labs    10/23/23 1000  PROT 7.3  ALBUMIN 3.8  AST 14*  ALT 17  ALKPHOS 36*  BILITOT 1.0    Studies/Results: CT ABDOMEN PELVIS W CONTRAST  Result Date: 10/23/2023 CLINICAL DATA:  Several day history of abdominal pain and emesis EXAM: CT ABDOMEN AND PELVIS WITH CONTRAST TECHNIQUE: Multidetector CT imaging of the abdomen and pelvis was performed using the standard protocol following bolus administration of intravenous contrast. RADIATION DOSE REDUCTION: This exam was performed according  to the departmental dose-optimization program which includes automated exposure control, adjustment of the mA and/or kV according to patient size and/or use of iterative reconstruction technique. CONTRAST:  OMNIPAQUE IOHEXOL 300 MG/ML  SOLN COMPARISON:  CT abdomen and pelvis dated 09/30/2023 FINDINGS: Lower chest: No focal consolidation or pulmonary nodule in the lung bases. Pleural effusion. Partially imaged heart size is normal. Coronary artery calcifications. Partially imaged hypodensity within a right lower lobe pulmonary artery (2:1). Hepatobiliary: No focal hepatic lesions. No intra or extrahepatic biliary ductal dilation. Normal gallbladder. Pancreas: Mild peripheral hypodensity involving the pancreatic head and neck adjacent to the duodenal inflammation. No focal lesions or main  ductal dilation. Spleen: Normal in size without focal abnormality. Adrenals/Urinary Tract: No adrenal nodules. Bilateral renal cysts, a few of which are minimally complicated. No specific follow-up imaging recommended. No hydronephrosis or calculi. Mobile stones within the urinary bladder. Stomach/Bowel: Circumferential mural thickening of the distal esophagus. Markedly distended stomach to the level of the proximal duodenum where there is again circumferential mural thickening and heterogeneous hypoattenuation no evidence of bowel wall thickening, distention, or inflammatory changes. Colonic diverticulosis without acute diverticulitis. Normal appendix. Vascular/Lymphatic: Aortic atherosclerosis. New focal hypoattenuation within the upstream portal vein (2:26). No enlarged abdominal or pelvic lymph nodes. Reproductive: Markedly enlarged prostate gland. Other: No free fluid, fluid collection, or free air. Musculoskeletal: No acute or abnormal lytic or blastic osseous lesions. Multilevel degenerative changes of the partially imaged thoracic and lumbar spine. Small fat-containing paraumbilical hernia. IMPRESSION: 1. Markedly distended stomach to the level of the proximal duodenum where there is again circumferential mural thickening and heterogeneous hypoattenuation at the site of previously noted duodenitis. Findings suspicious for gastric outlet obstruction. 2. New focal hypoattenuation within the upstream portal vein, suspicious for nonocclusive thrombosis. 3. Partially imaged hypodensity within a right lower lobe pulmonary artery may be artifactual or represent a pulmonary embolism. 4. Mild peripheral hypodensity involving the pancreatic head and neck adjacent to the duodenal inflammation, which may be reactive. 5. Circumferential mural thickening of the distal esophagus, which may be due to esophagitis. 6. Markedly enlarged prostate gland. 7. Mobile stones within the urinary bladder. 8.  Aortic Atherosclerosis  (ICD10-I70.0). Critical Value/emergent results were called by telephone at the time of interpretation on 10/23/2023 at 1:51 pm to provider [facility], who verbally acknowledged these results. Electronically Signed   By: Agustin Cree M.D.   On: 10/23/2023 13:55   DG Chest Portable 1 View  Result Date: 10/23/2023 CLINICAL DATA:  Weakness. EXAM: PORTABLE CHEST 1 VIEW COMPARISON:  December 08, 2022. FINDINGS: The heart size and mediastinal contours are within normal limits. Both lungs are clear. The visualized skeletal structures are unremarkable. IMPRESSION: No active disease. Electronically Signed   By: Lupita Raider M.D.   On: 10/23/2023 12:11    Impression: 72 y.o. year old male with history of COPD, diabetes, ischemic colitis in 2021, HLD, HTN, depression, chronic epigastric pain, constipation, esophageal wall thickening, duodenitis noted on imaging this year, recent admission earlier this month with pneumonia, concerns for gastric outlet obstruction in the setting of duodenal inflammation on CT.  EGD completed 10/02/2023 showing esophagitis, few nonbleeding superficial gastric ulcers in the gastric body, duodenitis in the second portion of the duodenum with visualization difficult due to presence of food and angulation of the duodenum with recommendations to repeat EGD in 1-2 weeks, maintain on PPI twice daily and Carafate.   He has now returning to the emergency room today with decreased p.o. intake, worsening postprandial  abdominal pain, belching with regurgitation, weakness. CT with marked distention of the stomach to the level of the duodenum where there is circumferential thickening and heterogenous hypoattenuation at the site of the patient's previous duodenitis. Also with new hypodensity upstream portal vein suspicious for nonocclusive thrombus. There is also a partially imaged hypodensity in the right upper lobe pulmonary artery which may represent artifact versus PE. GI consulted due to  concerns for GOO.   Postprandial abdominal pain, belching/regurgitation of food, possible GOO on CT:  Likely secondary to severe duodenitis +/- duodenal stricture secondary to daily BC powders.  Unfortunately, due to retained food at the time of prior EGD on 10/15, we were unable to visualize this area.  Considering patient's worsening symptoms, marked dilation of the stomach and proximal duodenum, he will need placement of NG tube for decompression followed by EGD for further evaluation.   Possible PE/possible portal vein thrombus:  Hospitalist has started patient on heparin with plans for CTA chest abdomen pelvis on 11/6 if renal function remains stable.  We have recommended liver Doppler.  Abnormal pancreatic imaging:  CT this admission with mild peripheral hypodensity involving the pancreatic head and neck adjacent to the duodenal inflammation, which may be reactive. This was seen on previous imaging as well with recommendations for outpatient dedicated imaging of the pancreas.   Plan: NPO NG tube for decompression Proceed with upper endoscopy with propofol by Dr. Jena Gauss tomorrow. The risks, benefits, and alternatives have been discussed with the patient in detail. The patient states understanding and desires to proceed. Will need to hold heparin at 6am.   IV PPI BID Follow-up on Liver Doppler and CTA.  Need outpatient dedicated pancreatic imaging.    LOS: 0 days    10/23/2023, 2:58 PM   Ermalinda Memos, [facility]

## 2023-10-23 NOTE — ED Provider Notes (Signed)
[place] EMERGENCY DEPARTMENT AT [facility] Provider Note   CSN: [id number] Arrival date & time: 10/23/23  5366     History  No chief complaint on file.   Matthew Molina is a 72 y.o. male.  PMH significant for peptic ulcer disease, COPD on home oxygen as needed, CAD, type 2 diabetes, hypertension, high cholesterol, ischemic colitis and schizophrenia.  He presents the ER today complaining of abdominal pain, generalized weakness, hiccups and belching which has been going on for couple of weeks, was admitted for this on October 13 and discharged on the 16th.  Reports he was dehydrated and had a peptic ulcer.  Notes also reveal that he had severe sepsis with middle lobe pneumonia on the right.  Having decreased p.o. intake, abdominal pain worse in the right lower quadrant and generalized weakness.  Occasionally has some vomit, up when he belches but not having significant vomiting otherwise.  No chest pain or shortness of breath.  Has been worse in the past several days.  HPI     Home Medications Prior to Admission medications   Medication Sig Start Date End Date Taking? Authorizing Provider  ACCU-CHEK GUIDE test strip USE TO CHECK BLOOD SUGAR ONCE DAILY 07/31/23   Kerri Perches, MD  Accu-Chek Softclix Lancets lancets TEST BLOOD SUGAR ONCE DAILY AS DIRECTED. 10/19/23   Kerri Perches, MD  acetaminophen (TYLENOL) 500 MG tablet Take 1,000 mg by mouth 2 (two) times daily as needed for headache, moderate pain or fever.    [provider]  albuterol (VENTOLIN HFA) 108 (90 Base) MCG/ACT inhaler INHALE 2 PUFFS INTO THE LUNGS EVERY 6 HOURS AS NEEDED FOR WHEEZING OR SHORTNESS OF BREATH. 10/17/23   Kerri Perches, MD  busPIRone (BUSPAR) 7.5 MG tablet Take 7.5 mg by mouth 3 (three) times daily.    [provider]  FARXIGA 5 MG TABS tablet Take 5 mg by mouth daily. 07/27/22   [provider]  fenofibrate (TRICOR) 145 MG tablet Take 1 tablet (145 mg  total) by mouth daily. 09/17/23   Kerri Perches, MD  ipratropium-albuterol (DUONEB) 0.5-2.5 (3) MG/3ML SOLN INHALE 1 VIAL VIA NEBULIZER EVERY 4 TO 6 HOURS. Patient taking differently: Take 3 mLs by nebulization 2 (two) times daily. 06/27/23   Kerri Perches, MD  linaclotide [facility]) 145 MCG CAPS capsule Take 1 capsule (145 mcg total) by mouth daily before breakfast. 08/08/23   Aida Raider, NP  montelukast (SINGULAIR) 10 MG tablet TAKE (1) TABLET BY MOUTH AT BEDTIME. 08/21/23   Kerri Perches, MD  Multiple Vitamins-Minerals (MULTIVITAMIN WITH MINERALS) tablet Take 1 tablet by mouth daily.    [provider]  ondansetron (ZOFRAN-ODT) 4 MG disintegrating tablet DISSOLVE 1 TABLET ON TONGUE ONCE DAILY AS NEEDED. 09/13/23   Aida Raider, NP  pantoprazole (PROTONIX) 40 MG tablet Take 1 tablet (40 mg total) by mouth 2 (two) times daily. 10/03/23 10/02/24  Sherryll Burger, Pratik D, DO  risperiDONE (RISPERDAL) 1 MG tablet Take 1 tablet (1 mg total) by mouth at bedtime. 09/26/23   Kerri Perches, MD  risperidone (RISPERDAL) 4 MG tablet TAKE ONE TABLET BY MOUTH IN THE EVENING. TAKE WITH 1MG  TABLET. Patient taking differently: Take 4 mg by mouth at bedtime. 07/20/23   Kerri Perches, MD  sucralfate (CARAFATE) 1 GM/10ML suspension Take 10 mLs (1 g total) by mouth 4 (four) times daily -  with meals and at bedtime. 10/16/23   Aida Raider, NP  SYMBICORT 160-4.5 MCG/ACT inhaler INHALE 2 PUFFS INTO THE LUNGS ONCE IN THE MORNING AND AT BEDTIME. 10/17/23   Kerri Perches, MD  tamsulosin (FLOMAX) 0.4 MG CAPS capsule Take 1 capsule (0.4 mg total) by mouth daily after supper. 09/12/23   McKenzie, Mardene Lillian Ballester, MD  traZODone (DESYREL) 50 MG tablet TAKE ONE TABLET BY MOUTH AT BEDTIME AS NEEDED FOR SLEEP. 10/17/23   Kerri Perches, MD  UNABLE TO FIND Med Name: walking cane 10/12/23   Kerri Perches, MD  sildenafil (VIAGRA) 100 MG tablet Take 100 mg by mouth. Take one tablet 30 mins  before intercourse   03/11/12  [provider]      Allergies    Asa [aspirin], Crestor [rosuvastatin], Wellbutrin [bupropion], and Zoloft [sertraline hcl]    Review of Systems   Review of Systems  Physical Exam Updated Vital Signs BP (!) 138/94 (BP Location: Left Arm)   Pulse (!) 102   Temp 98.6 F (37 C) (Oral)   Resp 16   Ht 6' (1.829 m)   Wt 88 kg   SpO2 92%   BMI 26.31 kg/m  Physical Exam Vitals and nursing note reviewed.  Constitutional:      General: He is not in acute distress.    Appearance: He is well-developed.  HENT:     Head: Normocephalic and atraumatic.     Nose: Nose normal.     Mouth/Throat:     Mouth: Mucous membranes are moist.  Eyes:     Conjunctiva/sclera: Conjunctivae normal.  Cardiovascular:     Rate and Rhythm: Regular rhythm. Tachycardia present.     Heart sounds: No murmur heard. Pulmonary:     Effort: Pulmonary effort is normal. No respiratory distress.     Breath sounds: Normal breath sounds.  Abdominal:     General: There is distension.     Palpations: Abdomen is soft.     Tenderness: There is abdominal tenderness in the right lower quadrant. There is no right CVA tenderness, left CVA tenderness, guarding or rebound.  Musculoskeletal:        General: No swelling or tenderness.     Cervical back: Normal range of motion and neck supple.  Skin:    General: Skin is warm and dry.     Capillary Refill: Capillary refill takes less than 2 seconds.  Neurological:     General: No focal deficit present.     Mental Status: He is alert and oriented to person, place, and time.  Psychiatric:        Mood and Affect: Mood normal.     ED Results / Procedures / Treatments   Labs (all labs ordered are listed, but only abnormal results are displayed) Labs Reviewed  CBC WITH DIFFERENTIAL/PLATELET - Abnormal; Notable for the following components:      Result Value   RBC 3.93 (*)    Hemoglobin 11.1 (*)    HCT 35.2 (*)    RDW 16.5 (*)     All other components within normal limits  COMPREHENSIVE METABOLIC PANEL - Abnormal; Notable for the following components:   Sodium 132 (*)    Potassium 3.1 (*)    Chloride 91 (*)    Glucose, Bld 151 (*)    Creatinine, Ser 1.53 (*)    AST 14 (*)    Alkaline Phosphatase 36 (*)    GFR, Estimated 48 (*)    All other components within normal limits  LIPASE, BLOOD - Abnormal; Notable for the following  components:   Lipase 84 (*)    All other components within normal limits  LACTIC ACID, PLASMA  URINALYSIS, ROUTINE W REFLEX MICROSCOPIC  PROTEIN ELECTROPHORESIS, SERUM  KAPPA/LAMBDA LIGHT CHAINS  LACTATE DEHYDROGENASE  HEPARIN LEVEL (UNFRACTIONATED)    EKG None  Radiology CT ABDOMEN PELVIS W CONTRAST  Result Date: 10/23/2023 CLINICAL DATA:  Several day history of abdominal pain and emesis EXAM: CT ABDOMEN AND PELVIS WITH CONTRAST TECHNIQUE: Multidetector CT imaging of the abdomen and pelvis was performed using the standard protocol following bolus administration of intravenous contrast. RADIATION DOSE REDUCTION: This exam was performed according to the departmental dose-optimization program which includes automated exposure control, adjustment of the mA and/or kV according to patient size and/or use of iterative reconstruction technique. CONTRAST:  OMNIPAQUE IOHEXOL 300 MG/ML  SOLN COMPARISON:  CT abdomen and pelvis dated 09/30/2023 FINDINGS: Lower chest: No focal consolidation or pulmonary nodule in the lung bases. Pleural effusion. Partially imaged heart size is normal. Coronary artery calcifications. Partially imaged hypodensity within a right lower lobe pulmonary artery (2:1). Hepatobiliary: No focal hepatic lesions. No intra or extrahepatic biliary ductal dilation. Normal gallbladder. Pancreas: Mild peripheral hypodensity involving the pancreatic head and neck adjacent to the duodenal inflammation. No focal lesions or main ductal dilation. Spleen: Normal in size without focal  abnormality. Adrenals/Urinary Tract: No adrenal nodules. Bilateral renal cysts, a few of which are minimally complicated. No specific follow-up imaging recommended. No hydronephrosis or calculi. Mobile stones within the urinary bladder. Stomach/Bowel: Circumferential mural thickening of the distal esophagus. Markedly distended stomach to the level of the proximal duodenum where there is again circumferential mural thickening and heterogeneous hypoattenuation no evidence of bowel wall thickening, distention, or inflammatory changes. Colonic diverticulosis without acute diverticulitis. Normal appendix. Vascular/Lymphatic: Aortic atherosclerosis. New focal hypoattenuation within the upstream portal vein (2:26). No enlarged abdominal or pelvic lymph nodes. Reproductive: Markedly enlarged prostate gland. Other: No free fluid, fluid collection, or free air. Musculoskeletal: No acute or abnormal lytic or blastic osseous lesions. Multilevel degenerative changes of the partially imaged thoracic and lumbar spine. Small fat-containing paraumbilical hernia. IMPRESSION: 1. Markedly distended stomach to the level of the proximal duodenum where there is again circumferential mural thickening and heterogeneous hypoattenuation at the site of previously noted duodenitis. Findings suspicious for gastric outlet obstruction. 2. New focal hypoattenuation within the upstream portal vein, suspicious for nonocclusive thrombosis. 3. Partially imaged hypodensity within a right lower lobe pulmonary artery may be artifactual or represent a pulmonary embolism. 4. Mild peripheral hypodensity involving the pancreatic head and neck adjacent to the duodenal inflammation, which may be reactive. 5. Circumferential mural thickening of the distal esophagus, which may be due to esophagitis. 6. Markedly enlarged prostate gland. 7. Mobile stones within the urinary bladder. 8.  Aortic Atherosclerosis (ICD10-I70.0). Critical Value/emergent results were  called by telephone at the time of interpretation on 10/23/2023 at 1:51 pm to provider [facility], who verbally acknowledged these results. Electronically Signed   By: Agustin Cree M.D.   On: 10/23/2023 13:55   DG Chest Portable 1 View  Result Date: 10/23/2023 CLINICAL DATA:  Weakness. EXAM: PORTABLE CHEST 1 VIEW COMPARISON:  December 08, 2022. FINDINGS: The heart size and mediastinal contours are within normal limits. Both lungs are clear. The visualized skeletal structures are unremarkable. IMPRESSION: No active disease. Electronically Signed   By: Lupita Raider M.D.   On: 10/23/2023 12:11    Procedures Procedures    Medications Ordered in ED Medications  potassium chloride 10 mEq in  100 mL IVPB (has no administration in time range)  pantoprazole (PROTONIX) injection 40 mg (has no administration in time range)  heparin bolus via infusion 5,000 Units (has no administration in time range)  heparin ADULT infusion 100 units/mL (25000 units/277mL) (has no administration in time range)  lactated ringers bolus 1,000 mL (0 mLs Intravenous Stopped 10/23/23 1241)  fentaNYL (SUBLIMAZE) injection 50 mcg (50 mcg Intravenous Given 10/23/23 1237)  iohexol (OMNIPAQUE) 300 MG/ML solution 100 mL (100 mLs Intravenous Contrast Given 10/23/23 1137)    ED Course/ Medical Decision Making/ A&P Clinical Course as of 10/23/23 1447  Tue Oct 23, 2023  1425 Patient missed oncology appointment this morning to have labs drawn, provider from the office contact me and asked me to order his routine labs so that they can have them available when he follows up in a couple of weeks. These were ordered at this time. [CB]    Clinical Course User Index [CB] Ma Rings, PA-C                                 Medical Decision Making This patient presents to the ED for concern of abdominal pain, hiccups, this involves an extensive number of treatment options, and is a complaint that carries with it a high risk of  complications and morbidity.  The differential diagnosis includes gastritis, gastroenteritis, bowel obstruction, pancreatitis, appendicitis, cholecystitis, diverticulitis, DKA, nephrolithiasis, gastroparesis, other    Co morbidities that complicate the patient evaluation :   PUD   Additional history obtained:  Additional history obtained from EMR External records from outside source obtained and reviewed including notes, labs   Lab Tests:  I Ordered, and personally interpreted labs.  The pertinent results include: No leukocytosis, hemoglobin is 11.1 which is improved from recent, lactic acid is normal, CMP shows mild hypokalemia, baseline renal function, lipase slightly elevated 84   Imaging Studies ordered:  I ordered imaging studies including CT abdomen pelvis which shows markedly distended stomach with thickening at the duodenum findings suspicious for gastric outlet obstruction Chest x-ray shows no pleural effusion, no pulmonary edema or infiltrate I independently visualized and interpreted imaging within scope of identifying emergent findings  I agree with the radiologist interpretation-if no on CT scan radiology noted a new focal hypoattenuation within the upstream portal vein suspicious for nonocclusive thrombus and partially imaged hypodensity within the  right lower lobe pulmonary artery artifact versus PE   Cardiac Monitoring: / EKG:  The patient was maintained on a cardiac monitor.  I personally viewed and interpreted the cardiac monitored which showed an underlying rhythm of: sinus rhythm   Consultations Obtained:  I requested consultation with the on-call gastroenterologist Dr. Levon Hedger,  and discussed lab and imaging findings as well as pertinent plan - they recommend: NG tube to decompress patient's stomach and please order Doppler ultrasound of right upper quadrant.  Admit to hospitalist.   Problem List / ED Course / Critical interventions / Medication  management   Patient is here for abdominal pain with nausea and vomiting, having frequent hiccups and every time he belches he states vomit comes up with.  Family states he has history of stomach ulcers and had admission a couple weeks ago for this.  No fevers or chills, no chest pain or shortness of breath.  He is home oxygen as needed and has COPD.  Also followed by cancer center for MGUS.  Renal function is about  his baseline.  He ate some spaghetti and had some water this morning with his last oral intake, given his vomiting this morning he is given IV fluids which improved his tachycardia.  CT was ordered and showed markedly distended abdomen, allergy notes likely gastric outlet obstruction.  Also notes possible PE and possible nonocclusive portal vein thrombosis.  I spoke with on-call GI, requested as above NG tube and right upper quadrant Doppler.  Given the findings of possible, embolism will start on heparin, his renal functions are at his baseline, no leukocytosis.  Patient is feeling better after pain medicine. Labs show mild hypokalemia, mildly elevated lipase, I ordered medication including fentanyl for pain Reevaluation of the patient after these medicines showed that the patient improved I have reviewed the patients home medicines and have made adjustments as needed       Amount and/or Complexity of Data Reviewed Labs: ordered. Radiology: ordered.  Risk Prescription drug management. Decision regarding hospitalization.           Final Clinical Impression(s) / ED Diagnoses Final diagnoses:  Gastric outlet obstruction    Rx / DC Orders ED Discharge Orders     None         Ma Rings, PA-C 10/23/23 1447    Glendora Score, MD 10/23/23 1931

## 2023-10-23 NOTE — Progress Notes (Signed)
Received report from am nurse that patient had just arrive on floor and pulled his NG tube out. According to report this was 3rd NG tube. Went to bedside to assess pt, ng tube laying by sink. Asked the patient if he would allow for another attempt and patient stated no. He said that we had tried multiple times and he didn't want one. Educated patient on reasons he needed NG tube. Pt verbalized understanding but said he still didn't want one.

## 2023-10-23 NOTE — H&P (Addendum)
History and Physical    Patient: Matthew Molina NWG:[id number] DOB: 07-May-1951 DOA: 10/23/2023 DOS: the patient was seen and examined on 10/23/2023 PCP: Kerri Perches, MD  Patient coming from: Home  Chief Complaint: No chief complaint on file.  HPI: Matthew Molina is a 72 year old male with a history of IgG MGUS, COPD, diabetes mellitus type 2, hypertension, hyperlipidemia, depression, tobacco abuse, coronary disease presenting with generalized weakness, abdominal pain, nausea and vomiting. The patient was recently admitted to the hospital from 09/30/2023 to 10/03/2023.  At that time, the patient had nausea and vomiting and poor oral intake and upper abdominal pain as well.  The patient was treated for sepsis secondary to aspiration pneumonia.  GI was consulted during the admission.  The patient underwent EGD on 10/02/2023 which showed grade B esophagitis without bleeding, nonbleeding gastric ulcers with a clean base, and duodenitis.  The patient was discharged home with PPI twice daily and sulcrafate.  The patient states that he was feeling well after d/c and he was tolerating a soft diet. However, he but began having gradual abdominal pain and belching in the past 1 week.  He states that things have gradually worsened over the past 3 days prior to admission with increasing belching and hiccups that has resulted in vomiting every time he belches.  He has had poor oral intake and progressive generalized weakness.  He also complains of worsening mid abdominal pain.  He denies f/c, cp, sob, hemoptysis, diarrhea, hematochezia, melena, dysuria, hematuria.  As result of abd pain with n/v, the patient presented for further evaluation and treatment.  He continues to smoke a few cigarette's a day. Notably, patient states he ate spaghetti and chocolate chip cookies before he came to ED and did not have any emesis.   In the ED, the patient was afebrile hemodynamically stable with oxygen saturation 92% on  room air.  WBC 6.0, hemoglobin 11.1, platelets 332.  Sodium 132, potassium 3.1, bicarbonate 28, serum creatinine 1.53.  LFTs were unremarkable.  Lipase 84.  CT of the abdomen and pelvis showed marked distention of the stomach to the level of the duodenum where there is circumferential thickening and heterogenous hypoattenuation at the site of the patient's previous duodenitis. There was a new hypodensity upstream portal vein suspicious for nonocclusive thrombus.  There is also a partially imaged hypodensity in the right upper lobe pulmonary artery which may represent artifact versus PE. The patient was started on IV heparin. -GI was consulted to assist with management.  Review of Systems: As mentioned in the history of present illness. All other systems reviewed and are negative. Past Medical History:  Diagnosis Date   Acute blood loss anemia 01/05/2020   Acute respiratory failure with hypoxia (HCC)    Arthritis    COPD (chronic obstructive pulmonary disease) (HCC)    Depression    Diabetes mellitus    Diabetes mellitus without complication (HCC)    Diverticulitis    Elevated PSA 10/01/2016   Encounter for support and coordination of transition of care 01/14/2020   Hypercholesterolemia    Hyperlipidemia    Hypertension    Insomnia 01/05/2016   Ischemic colitis (HCC) 01/05/2020   Multiple lung nodules on CT 04/02/2015   Nicotine addiction    Obesity    Oxygen deficiency    qhs   Schizophrenia [facility])    Past Surgical History:  Procedure Laterality Date   BIOPSY  05/28/2023   Procedure: BIOPSY;  Surgeon: Lanelle Bal, DO;  [facility];  Service: Endoscopy;;   BIOPSY  10/02/2023   Procedure: BIOPSY;  Surgeon: Dolores Frame, MD;  Location: [facility];  Service: Gastroenterology;;   CATARACT EXTRACTION W/PHACO Left 11/20/2013   Procedure: CATARACT EXTRACTION PHACO AND INTRAOCULAR LENS PLACEMENT (IOC);  Surgeon: Gemma Payor, MD;  Location: [facility];  Service:  Ophthalmology;  Laterality: Left;  CDE:10.26   CATARACT EXTRACTION W/PHACO Right 12/08/2013   Procedure: RIGHT EYE CATARACT EXTRACTION PHACO AND INTRAOCULAR LENS PLACEMENT ;  Surgeon: Gemma Payor, MD;  Location: [facility];  Service: Ophthalmology;  Laterality: Right;  CDE 12.38   COLONOSCOPY N/A 04/01/2013   pancolonic diverticulosis, redundant colon, large internal hemorrhoids.    COLONOSCOPY  2014   INCOMPLETE PREP IN R COLON   COLONOSCOPY N/A 04/01/2020   Procedure: COLONOSCOPY;  Surgeon: West Bali, MD;  [facility]: [facility];  Service: Endoscopy;  Laterality: N/A;  8:45am   ESOPHAGOGASTRODUODENOSCOPY N/A 02/18/2016   stricture at GE junction, moderate erosive gastritis and mild non-erosive duodenitis. +H.pylori. Treated initially with Prevpac. Breath test to check for eradication was positive, and he was prescribed Pylera.    ESOPHAGOGASTRODUODENOSCOPY (EGD) WITH PROPOFOL N/A 05/28/2023   Procedure: ESOPHAGOGASTRODUODENOSCOPY (EGD) WITH PROPOFOL;  Surgeon: Lanelle Bal, DO;  [facility];  Service: Endoscopy;  Laterality: N/A;  11:15am, asa 3   ESOPHAGOGASTRODUODENOSCOPY (EGD) WITH PROPOFOL N/A 10/02/2023   Procedure: ESOPHAGOGASTRODUODENOSCOPY (EGD) WITH PROPOFOL;  Surgeon: Dolores Frame, MD;  [facility];  Service: Gastroenterology;  Laterality: N/A;   EYE SURGERY Left 11/2013   cataract extraction   POLYPECTOMY  04/01/2020   Procedure: POLYPECTOMY;  Surgeon: West Bali, MD;  [facility];  Service: Endoscopy;;   SAVORY DILATION N/A 02/18/2016   Procedure: SAVORY DILATION;  Surgeon: West Bali, MD;  [facility];  Service: Endoscopy;  Laterality: N/A;   SHOULDER SURGERY Right    Social History:  reports that he has been smoking cigarettes. He has a 48 pack-year smoking history. He has never used smokeless tobacco. He reports current alcohol use. He reports that he does not use drugs.  Allergies  Allergen  Reactions   Asa [Aspirin] Other (See Comments)    Stomach pain   Crestor [Rosuvastatin] Other (See Comments)    Stomach pain   Wellbutrin [Bupropion] Other (See Comments)    Stomach pain   Zoloft [Sertraline Hcl] Other (See Comments)    Stomach pain    Family History  Problem Relation Age of Onset   Diabetes Mother    Hypertension Mother    Stroke Mother    Diabetes Sister    Heart disease Sister    Kidney disease Father    Drug abuse Brother    Colon cancer Neg Hx    Colon polyps Neg Hx     Prior to Admission medications   Medication Sig Start Date End Date Taking? Authorizing Provider  ACCU-CHEK GUIDE test strip USE TO CHECK BLOOD SUGAR ONCE DAILY 07/31/23   Kerri Perches, MD  Accu-Chek Softclix Lancets lancets TEST BLOOD SUGAR ONCE DAILY AS DIRECTED. 10/19/23   Kerri Perches, MD  acetaminophen (TYLENOL) 500 MG tablet Take 1,000 mg by mouth 2 (two) times daily as needed for headache, moderate pain or fever.    [provider]  albuterol (VENTOLIN HFA) 108 (90 Base) MCG/ACT inhaler INHALE 2 PUFFS INTO THE LUNGS EVERY 6 HOURS AS NEEDED FOR WHEEZING OR SHORTNESS OF BREATH. 10/17/23  Kerri Perches, MD  busPIRone (BUSPAR) 7.5 MG tablet Take 7.5 mg by mouth 3 (three) times daily.    [provider]  FARXIGA 5 MG TABS tablet Take 5 mg by mouth daily. 07/27/22   [provider]  fenofibrate (TRICOR) 145 MG tablet Take 1 tablet (145 mg total) by mouth daily. 09/17/23   Kerri Perches, MD  ipratropium-albuterol (DUONEB) 0.5-2.5 (3) MG/3ML SOLN INHALE 1 VIAL VIA NEBULIZER EVERY 4 TO 6 HOURS. Patient taking differently: Take 3 mLs by nebulization 2 (two) times daily. 06/27/23   Kerri Perches, MD  linaclotide [facility]) 145 MCG CAPS capsule Take 1 capsule (145 mcg total) by mouth daily before breakfast. 08/08/23   Aida Raider, NP  montelukast (SINGULAIR) 10 MG tablet TAKE (1) TABLET BY MOUTH AT BEDTIME. 08/21/23   Kerri Perches, MD   Multiple Vitamins-Minerals (MULTIVITAMIN WITH MINERALS) tablet Take 1 tablet by mouth daily.    [provider]  ondansetron (ZOFRAN-ODT) 4 MG disintegrating tablet DISSOLVE 1 TABLET ON TONGUE ONCE DAILY AS NEEDED. 09/13/23   Aida Raider, NP  pantoprazole (PROTONIX) 40 MG tablet Take 1 tablet (40 mg total) by mouth 2 (two) times daily. 10/03/23 10/02/24  Sherryll Burger, Pratik D, DO  risperiDONE (RISPERDAL) 1 MG tablet Take 1 tablet (1 mg total) by mouth at bedtime. 09/26/23   Kerri Perches, MD  risperidone (RISPERDAL) 4 MG tablet TAKE ONE TABLET BY MOUTH IN THE EVENING. TAKE WITH 1MG  TABLET. Patient taking differently: Take 4 mg by mouth at bedtime. 07/20/23   Kerri Perches, MD  sucralfate (CARAFATE) 1 GM/10ML suspension Take 10 mLs (1 g total) by mouth 4 (four) times daily -  with meals and at bedtime. 10/16/23   Aida Raider, NP  SYMBICORT 160-4.5 MCG/ACT inhaler INHALE 2 PUFFS INTO THE LUNGS ONCE IN THE MORNING AND AT BEDTIME. 10/17/23   Kerri Perches, MD  tamsulosin (FLOMAX) 0.4 MG CAPS capsule Take 1 capsule (0.4 mg total) by mouth daily after supper. 09/12/23   McKenzie, Mardene Celeste, MD  traZODone (DESYREL) 50 MG tablet TAKE ONE TABLET BY MOUTH AT BEDTIME AS NEEDED FOR SLEEP. 10/17/23   Kerri Perches, MD  UNABLE TO FIND Med Name: walking cane 10/12/23   Kerri Perches, MD  sildenafil (VIAGRA) 100 MG tablet Take 100 mg by mouth. Take one tablet 30 mins before intercourse   03/11/12  [provider]    Physical Exam: Vitals:   10/23/23 0909 10/23/23 0910 10/23/23 1346  BP:  (!) 129/96 (!) 138/94  Pulse:  (!) 110 (!) 102  Resp:  (!) 24 16  Temp:  98.2 F (36.8 C) 98.6 F (37 C)  TempSrc:  Oral Oral  SpO2:  92% 92%  Weight: 88 kg    Height: 6' (1.829 m)    GENERAL:  A&O x 3, NAD, well developed, cooperative, follows commands HEENT: Rudy/AT, No thrush, No icterus, No oral ulcers Neck:  No neck mass, No meningismus, soft, supple CV: RRR, no S3,  no S4, no rub, no JVD Lungs:  bibasilar rales.  No wheeze.  Diminished BS. Abd: soft/mid abd pain +BS, nondistended Ext: No edema, no lymphangitis, no cyanosis, no rashes Neuro:  CN II-XII intact, strength 4/5 in RUE, RLE, strength 4/5 LUE, LLE; sensation intact bilateral; no dysmetria; babinski equivocal   Data Reviewed: Data reviewed above in history  Assessment and Plan: Portal venous thrombus?? And possible PE -10/23/2023 CT abd/pelvis--new focal hypoattenuation upstream portal vein  suspicious for nonocclusive thrombus.  Partially imaged hypodensity in the RLL pulmonary artery suspicious for artifact versus PE; marked distention of the stomach to the level of the duodenum where there is circumferential thickening and heterogenous hypoattenuation at the site of the prior duodenitis -start IV heparin empirically -portal venous doppler to clarify portal venous thrombus -plan for CTA chest, abd/pelvis on 10/24/23 if renal function remains stable  Intractable vomiting with functional GOO -due to severe duodenitis as discussed above in CT -10/02/23 EGD--grade B esophagitis without bleeding.  Nonbleeding gastric ulcers with clean base, +duodenitis -GI consulted -continue pantoprazole bid -continue sulcrafate -Start IV fluids -Remain n.p.o. for now  CKD stage IIIa -Baseline creatinine 1.2-1.5 -Monitor daily BMP  COPD -Stable on room air -Restart BDs -Continue Singulair when able to tolerate p.o.  Tobacco abuse -Tobacco cessation discussed  Depression/anxiety -restart risperdal and buspar when able to tolerate po  Controlled diabetes mellitus type 2 -10/07/2023 hemoglobin A1c 6.0  BPH -Restart tamsulosin when able to tolerate p.o.    Advance Care Planning: FULL  Consults: GI  Family Communication: significant other updated 11/5  Severity of Illness: The appropriate patient status for this patient is INPATIENT. Inpatient status is judged to be reasonable and necessary  in order to provide the required intensity of service to ensure the patient's safety. The patient's presenting symptoms, physical exam findings, and initial radiographic and laboratory data in the context of their chronic comorbidities is felt to place them at high risk for further clinical deterioration. Furthermore, it is not anticipated that the patient will be medically stable for discharge from the hospital within 2 midnights of admission.   * I certify that at the point of admission it is my clinical judgment that the patient will require inpatient hospital care spanning beyond 2 midnights from the point of admission due to high intensity of service, high risk for further deterioration and high frequency of surveillance required.*  Author: Catarina Hartshorn, MD 10/23/2023 3:32 PM  For on call review [email or web address].

## 2023-10-23 NOTE — ED Triage Notes (Signed)
Pt in c/o abd pain with emesis onset for several days per [facility], pt from [facility] facility, pt c/o gas, pt denies diarrhea, vitals WNL in route, A&O x4

## 2023-10-23 NOTE — H&P (View-Only) (Signed)
@LOGO @   Referring Provider: Ma Rings, PA-C  Primary Care Physician:  Kerri Perches, MD Primary Gastroenterologist:  Dr. Marletta Lor  Date of Admission: 10/23/2023 Date of Consultation: 10/23/2023  Reason for Consultation: Concern for gastric outlet obstruction  HPI:  Matthew Molina is a 72 y.o. year old male with history of COPD, diabetes, ischemic colitis in 2021, HLD, HTN, depression, chronic epigastric pain, constipation, esophageal wall thickening, duodenitis noted on imaging this year, recent admission earlier this month with pneumonia as well as acute on chronic abdominal pain, nausea, vomiting with concerns for gastric outlet obstruction in the setting of duodenal inflammation on CT.  EGD completed 10/02/2023 showing esophagitis, few nonbleeding superficial gastric ulcers in the gastric body biopsied, some deformity in the gastric body but no obstruction in the gastric outlet, duodenitis in the second portion of the duodenum with visualization difficult due to presence of food and angulation of the duodenum with recommendations to repeat EGD in 1-2 weeks, maintain on PPI twice daily and Carafate  He has now returning to the emergency room today with decreased p.o. intake, worsening abdominal pain, weakness.  ED Course:  Hemodynamically stable  Hemoglobin 11.1, sodium 132, potassium 3.1, bicarbonate 28, serum creatinine 1.53. LFTs were unremarkable. Lipase 84.  CT of the abdomen and pelvis showed marked distention of the stomach to the level of the duodenum where there is circumferential thickening and heterogenous hypoattenuation at the site of the patient's previous duodenitis. There was a new hypodensity upstream portal vein suspicious for nonocclusive thrombus.  There is also a partially imaged hypodensity in the right upper lobe pulmonary artery which may represent artifact versus PE. Liver Doppler has been ordered.  IV heparin has been ordered and GI consulted to assist  with management.   Consult:  Reports he did ok for few days after recent hospitalization, but then he started having progressive trouble with eating over the last week or so, reporting mid abdominal pain postprandially and also developing food regurgitation with belching but no true vomiting.  Feels foods are sitting in upper abdomen. Reports compliance with PPI twice daily and no real heartburn symptoms. He has also been compliant with Carafate 4 times daily.  Unfortunately, he has continued taking BC powders daily until about 4 days ago.  No BRBPR or melena or hematemesis.    Past Medical History:  Diagnosis Date   Acute blood loss anemia 01/05/2020   Acute respiratory failure with hypoxia (HCC)    Arthritis    COPD (chronic obstructive pulmonary disease) (HCC)    Depression    Diabetes mellitus    Diabetes mellitus without complication (HCC)    Diverticulitis    Elevated PSA 10/01/2016   Encounter for support and coordination of transition of care 01/14/2020   Hypercholesterolemia    Hyperlipidemia    Hypertension    Insomnia 01/05/2016   Ischemic colitis (HCC) 01/05/2020   Multiple lung nodules on CT 04/02/2015   Nicotine addiction    Obesity    Oxygen deficiency    qhs   Schizophrenia [facility])     Past Surgical History:  Procedure Laterality Date   BIOPSY  05/28/2023   Procedure: BIOPSY;  Surgeon: Lanelle Bal, DO;  [facility];  Service: Endoscopy;;   BIOPSY  10/02/2023   Procedure: BIOPSY;  Surgeon: Dolores Frame, MD;  [facility]: [facility];  Service: Gastroenterology;;   CATARACT EXTRACTION W/PHACO Left 11/20/2013   Procedure: CATARACT EXTRACTION PHACO AND INTRAOCULAR LENS PLACEMENT (IOC);  Surgeon: Gemma Payor, MD;  Location: [facility];  Service: Ophthalmology;  Laterality: Left;  CDE:10.26   CATARACT EXTRACTION W/PHACO Right 12/08/2013   Procedure: RIGHT EYE CATARACT EXTRACTION PHACO AND INTRAOCULAR LENS PLACEMENT ;  Surgeon: Gemma Payor, MD;   Location: [facility];  Service: Ophthalmology;  Laterality: Right;  CDE 12.38   COLONOSCOPY N/A 04/01/2013   pancolonic diverticulosis, redundant colon, large internal hemorrhoids.    COLONOSCOPY  2014   INCOMPLETE PREP IN R COLON   COLONOSCOPY N/A 04/01/2020   Procedure: COLONOSCOPY;  Surgeon: West Bali, MD;  [facility];  Service: Endoscopy;  Laterality: N/A;  8:45am   ESOPHAGOGASTRODUODENOSCOPY N/A 02/18/2016   stricture at GE junction, moderate erosive gastritis and mild non-erosive duodenitis. +H.pylori. Treated initially with Prevpac. Breath test to check for eradication was positive, and he was prescribed Pylera.    ESOPHAGOGASTRODUODENOSCOPY (EGD) WITH PROPOFOL N/A 05/28/2023   Procedure: ESOPHAGOGASTRODUODENOSCOPY (EGD) WITH PROPOFOL;  Surgeon: Lanelle Bal, DO;  [facility];  Service: Endoscopy;  Laterality: N/A;  11:15am, asa 3   ESOPHAGOGASTRODUODENOSCOPY (EGD) WITH PROPOFOL N/A 10/02/2023   Procedure: ESOPHAGOGASTRODUODENOSCOPY (EGD) WITH PROPOFOL;  Surgeon: Dolores Frame, MD;  [facility];  Service: Gastroenterology;  Laterality: N/A;   EYE SURGERY Left 11/2013   cataract extraction   POLYPECTOMY  04/01/2020   Procedure: POLYPECTOMY;  Surgeon: West Bali, MD;  [facility];  Service: Endoscopy;;   SAVORY DILATION N/A 02/18/2016   Procedure: SAVORY DILATION;  Surgeon: West Bali, MD;  [facility];  Service: Endoscopy;  Laterality: N/A;   SHOULDER SURGERY Right     Prior to Admission medications   Medication Sig Start Date End Date Taking? Authorizing Provider  glipiZIDE (GLUCOTROL XL) 10 MG 24 hr tablet Take 20 mg by mouth every morning. 10/17/23  Yes [provider]  rosuvastatin (CRESTOR) 10 MG tablet Take 10 mg by mouth daily. 10/10/23  Yes [provider]  ACCU-CHEK GUIDE test strip USE TO CHECK BLOOD SUGAR ONCE DAILY 07/31/23   Kerri Perches, MD  Accu-Chek Softclix  Lancets lancets TEST BLOOD SUGAR ONCE DAILY AS DIRECTED. 10/19/23   Kerri Perches, MD  acetaminophen (TYLENOL) 500 MG tablet Take 1,000 mg by mouth 2 (two) times daily as needed for headache, moderate pain or fever.    [provider]  albuterol (VENTOLIN HFA) 108 (90 Base) MCG/ACT inhaler INHALE 2 PUFFS INTO THE LUNGS EVERY 6 HOURS AS NEEDED FOR WHEEZING OR SHORTNESS OF BREATH. 10/17/23   Kerri Perches, MD  busPIRone (BUSPAR) 7.5 MG tablet Take 7.5 mg by mouth 3 (three) times daily.    [provider]  FARXIGA 5 MG TABS tablet Take 5 mg by mouth daily. 07/27/22   [provider]  fenofibrate (TRICOR) 145 MG tablet Take 1 tablet (145 mg total) by mouth daily. 09/17/23   Kerri Perches, MD  ipratropium-albuterol (DUONEB) 0.5-2.5 (3) MG/3ML SOLN INHALE 1 VIAL VIA NEBULIZER EVERY 4 TO 6 HOURS. Patient taking differently: Take 3 mLs by nebulization 2 (two) times daily. 06/27/23   Kerri Perches, MD  linaclotide [facility]) 145 MCG CAPS capsule Take 1 capsule (145 mcg total) by mouth daily before breakfast. 08/08/23   Aida Raider, NP  montelukast (SINGULAIR) 10 MG tablet TAKE (1) TABLET BY MOUTH AT BEDTIME. 08/21/23   Kerri Perches, MD  Multiple Vitamins-Minerals (MULTIVITAMIN WITH MINERALS) tablet Take 1 tablet by mouth daily.    [provider]  ondansetron (ZOFRAN-ODT) 4 MG disintegrating tablet DISSOLVE 1 TABLET ON TONGUE ONCE DAILY AS NEEDED. 09/13/23   Aida Raider, NP  pantoprazole (PROTONIX) 40 MG tablet Take 1 tablet (40 mg total) by mouth 2 (two) times daily. 10/03/23 10/02/24  Sherryll Burger, Pratik D, DO  risperiDONE (RISPERDAL) 1 MG tablet Take 1 tablet (1 mg total) by mouth at bedtime. 09/26/23   Kerri Perches, MD  risperidone (RISPERDAL) 4 MG tablet TAKE ONE TABLET BY MOUTH IN THE EVENING. TAKE WITH 1MG  TABLET. Patient taking differently: Take 4 mg by mouth at bedtime. 07/20/23   Kerri Perches, MD  sucralfate (CARAFATE) 1  GM/10ML suspension Take 10 mLs (1 g total) by mouth 4 (four) times daily -  with meals and at bedtime. 10/16/23   Aida Raider, NP  SYMBICORT 160-4.5 MCG/ACT inhaler INHALE 2 PUFFS INTO THE LUNGS ONCE IN THE MORNING AND AT BEDTIME. 10/17/23   Kerri Perches, MD  tamsulosin (FLOMAX) 0.4 MG CAPS capsule Take 1 capsule (0.4 mg total) by mouth daily after supper. 09/12/23   McKenzie, Mardene Celeste, MD  traZODone (DESYREL) 50 MG tablet TAKE ONE TABLET BY MOUTH AT BEDTIME AS NEEDED FOR SLEEP. 10/17/23   Kerri Perches, MD  UNABLE TO FIND Med Name: walking cane 10/12/23   Kerri Perches, MD  sildenafil (VIAGRA) 100 MG tablet Take 100 mg by mouth. Take one tablet 30 mins before intercourse   03/11/12  [provider]    Current Facility-Administered Medications  Medication Dose Route Frequency Provider Last Rate Last Admin   heparin ADULT infusion 100 units/mL (25000 units/264mL)  1,450 Units/hr Intravenous Continuous Kommor, Madison, MD       heparin bolus via infusion 5,000 Units  5,000 Units Intravenous Once Kommor, Madison, MD       pantoprazole (PROTONIX) injection 40 mg  40 mg Intravenous Once Beatty, Celeste A, PA-C       potassium chloride 10 mEq in 100 mL IVPB  10 mEq Intravenous Q1 Hr x 2 Beatty, Celeste A, PA-C       Current Outpatient Medications  Medication Sig Dispense Refill   glipiZIDE (GLUCOTROL XL) 10 MG 24 hr tablet Take 20 mg by mouth every morning.     rosuvastatin (CRESTOR) 10 MG tablet Take 10 mg by mouth daily.     ACCU-CHEK GUIDE test strip USE TO CHECK BLOOD SUGAR ONCE DAILY 100 strip 0   Accu-Chek Softclix Lancets lancets TEST BLOOD SUGAR ONCE DAILY AS DIRECTED. 100 each 5   acetaminophen (TYLENOL) 500 MG tablet Take 1,000 mg by mouth 2 (two) times daily as needed for headache, moderate pain or fever.     albuterol (VENTOLIN HFA) 108 (90 Base) MCG/ACT inhaler INHALE 2 PUFFS INTO THE LUNGS EVERY 6 HOURS AS NEEDED FOR WHEEZING OR SHORTNESS OF BREATH. 8.5  g 0   busPIRone (BUSPAR) 7.5 MG tablet Take 7.5 mg by mouth 3 (three) times daily.     FARXIGA 5 MG TABS tablet Take 5 mg by mouth daily.     fenofibrate (TRICOR) 145 MG tablet Take 1 tablet (145 mg total) by mouth daily. 30 tablet 3   ipratropium-albuterol (DUONEB) 0.5-2.5 (3) MG/3ML SOLN INHALE 1 VIAL VIA NEBULIZER EVERY 4 TO 6 HOURS. (Patient taking differently: Take 3 mLs by nebulization 2 (two) times daily.) 540 mL 3   linaclotide (LINZESS) 145 MCG CAPS capsule Take 1 capsule (145 mcg total) by mouth daily before breakfast. 30 capsule 3  montelukast (SINGULAIR) 10 MG tablet TAKE (1) TABLET BY MOUTH AT BEDTIME. 30 tablet 0   Multiple Vitamins-Minerals (MULTIVITAMIN WITH MINERALS) tablet Take 1 tablet by mouth daily.     ondansetron (ZOFRAN-ODT) 4 MG disintegrating tablet DISSOLVE 1 TABLET ON TONGUE ONCE DAILY AS NEEDED. 20 tablet 0   pantoprazole (PROTONIX) 40 MG tablet Take 1 tablet (40 mg total) by mouth 2 (two) times daily. 60 tablet 1   risperiDONE (RISPERDAL) 1 MG tablet Take 1 tablet (1 mg total) by mouth at bedtime. 90 tablet 1   risperidone (RISPERDAL) 4 MG tablet TAKE ONE TABLET BY MOUTH IN THE EVENING. TAKE WITH 1MG  TABLET. (Patient taking differently: Take 4 mg by mouth at bedtime.) 90 tablet 0   sucralfate (CARAFATE) 1 GM/10ML suspension Take 10 mLs (1 g total) by mouth 4 (four) times daily -  with meals and at bedtime. 420 mL 1   SYMBICORT 160-4.5 MCG/ACT inhaler INHALE 2 PUFFS INTO THE LUNGS ONCE IN THE MORNING AND AT BEDTIME. 10.2 g 0   tamsulosin (FLOMAX) 0.4 MG CAPS capsule Take 1 capsule (0.4 mg total) by mouth daily after supper. 90 capsule 3   traZODone (DESYREL) 50 MG tablet TAKE ONE TABLET BY MOUTH AT BEDTIME AS NEEDED FOR SLEEP. 90 tablet 0   UNABLE TO FIND Med Name: walking cane 1 each 0    Allergies as of 10/23/2023 - Reviewed 10/23/2023  Allergen Reaction Noted   Asa [aspirin] Other (See Comments) 09/30/2023   Crestor [rosuvastatin] Other (See Comments) 09/30/2023    Wellbutrin [bupropion] Other (See Comments) 07/29/2015   Zoloft [sertraline hcl] Other (See Comments) 11/05/2015    Family History  Problem Relation Age of Onset   Diabetes Mother    Hypertension Mother    Stroke Mother    Diabetes Sister    Heart disease Sister    Kidney disease Father    Drug abuse Brother    Colon cancer Neg Hx    Colon polyps Neg Hx     Social History   Socioeconomic History   Marital status: Divorced    Spouse name: Not on file   Number of children: 2   Years of education: Not on file   Highest education level: Not on file  Occupational History   Occupation: retired from Theatre stage manager   Tobacco Use   Smoking status: Every Day    Current packs/day: 1.00    Average packs/day: 1 pack/day for 48.0 years (48.0 ttl pk-yrs)    Types: Cigarettes   Smokeless tobacco: Never  Vaping Use   Vaping status: Never Used  Substance and Sexual Activity   Alcohol use: Yes    Alcohol/week: 0.0 standard drinks of alcohol    Comment: "little bit of beer"   Drug use: No   Sexual activity: Not Currently    Birth control/protection: None  Other Topics Concern   Not on file  Social History Narrative   ** Merged History Encounter **       ** Merged History Encounter **       Social Determinants of Health   Financial Resource Strain: Low Risk  (12/13/2022)   Overall Financial Resource Strain (CARDIA)    Difficulty of Paying Living Expenses: Not very hard  Food Insecurity: No Food Insecurity (10/09/2023)   Hunger Vital Sign    Worried About Running Out of Food in the Last Year: Never true    Ran Out of Food in the Last Year: Never true  Transportation Needs: No Transportation  Needs (10/09/2023)   PRAPARE - Administrator, Civil Service (Medical): No    Lack of Transportation (Non-Medical): No  Physical Activity: Inactive (11/27/2022)   Exercise Vital Sign    Days of Exercise per Week: 0 days    Minutes of Exercise per Session: 0 min  Stress: No Stress  Concern Present (11/27/2022)   [employer] of Occupational Health - Occupational Stress Questionnaire    Feeling of Stress : Not at all  Social Connections: Moderately Isolated (11/27/2022)   Social Connection and Isolation Panel [NHANES]    Frequency of Communication with Friends and Family: More than three times a week    Frequency of Social Gatherings with Friends and Family: More than three times a week    Attends Religious Services: More than 4 times per year    Active Member of [employer] or Organizations: No    Attends Banker Meetings: Never    Marital Status: Divorced  Catering manager Violence: Not At Risk (11/27/2022)   Humiliation, Afraid, Rape, and Kick questionnaire    Fear of Current or Ex-Partner: No    Emotionally Abused: No    Physically Abused: No    Sexually Abused: No    Review of Systems: Gen: Denies fever, chills, cold or flulike symptoms, presyncope, syncope. CV: Denies chest pain, heart palpitations. Resp: Denies shortness of breath, cough. GI: See HPI GU : Denies urinary burning, urinary frequency, urinary incontinence.  MS: Admits to chronic back pain. Heme: See HPI  Physical Exam: Vital signs in last 24 hours: Temp:  [98.2 F (36.8 C)-98.6 F (37 C)] 98.6 F (37 C) (11/05 1346) Pulse Rate:  [102-110] 102 (11/05 1346) Resp:  [16-24] 16 (11/05 1346) BP: (129-138)/(94-96) 138/94 (11/05 1346) SpO2:  [92 %] 92 % (11/05 1346) Weight:  [88 kg] 88 kg (11/05 0909)   General:   Alert,  Well-developed, well-nourished, pleasant and cooperative in NAD Head:  Normocephalic and atraumatic. Eyes:  Sclera clear, no icterus.   Conjunctiva pink. Ears:  Normal auditory acuity. Lungs:  Clear throughout to auscultation.   No wheezes, crackles, or rhonchi. No acute distress. Heart:  Regular rate and rhythm; no murmurs, clicks, rubs,  or gallops. Abdomen:  Full but soft. Mild TTP in epigastric and periumbilical area.  Normal bowel sounds, without  guarding, and without rebound.   Rectal:  Deferred  Msk:  Symmetrical without gross deformities. Normal posture. Extremities:  Without edema. Neurologic:  Alert and  oriented  Skin:  Intact without significant lesions or rashes. Psych: Normal mood and affect.  Intake/Output from previous day: No intake/output data recorded. Intake/Output this shift: No intake/output data recorded.  Lab Results: Recent Labs    10/23/23 1000  WBC 6.0  HGB 11.1*  HCT 35.2*  PLT 332   BMET Recent Labs    10/23/23 1000  NA 132*  K 3.1*  CL 91*  CO2 26  GLUCOSE 151*  BUN 20  CREATININE 1.53*  CALCIUM 10.3   LFT Recent Labs    10/23/23 1000  PROT 7.3  ALBUMIN 3.8  AST 14*  ALT 17  ALKPHOS 36*  BILITOT 1.0    Studies/Results: CT ABDOMEN PELVIS W CONTRAST  Result Date: 10/23/2023 CLINICAL DATA:  Several day history of abdominal pain and emesis EXAM: CT ABDOMEN AND PELVIS WITH CONTRAST TECHNIQUE: Multidetector CT imaging of the abdomen and pelvis was performed using the standard protocol following bolus administration of intravenous contrast. RADIATION DOSE REDUCTION: This exam was performed according  to the departmental dose-optimization program which includes automated exposure control, adjustment of the mA and/or kV according to patient size and/or use of iterative reconstruction technique. CONTRAST:  OMNIPAQUE IOHEXOL 300 MG/ML  SOLN COMPARISON:  CT abdomen and pelvis dated 09/30/2023 FINDINGS: Lower chest: No focal consolidation or pulmonary nodule in the lung bases. Pleural effusion. Partially imaged heart size is normal. Coronary artery calcifications. Partially imaged hypodensity within a right lower lobe pulmonary artery (2:1). Hepatobiliary: No focal hepatic lesions. No intra or extrahepatic biliary ductal dilation. Normal gallbladder. Pancreas: Mild peripheral hypodensity involving the pancreatic head and neck adjacent to the duodenal inflammation. No focal lesions or main  ductal dilation. Spleen: Normal in size without focal abnormality. Adrenals/Urinary Tract: No adrenal nodules. Bilateral renal cysts, a few of which are minimally complicated. No specific follow-up imaging recommended. No hydronephrosis or calculi. Mobile stones within the urinary bladder. Stomach/Bowel: Circumferential mural thickening of the distal esophagus. Markedly distended stomach to the level of the proximal duodenum where there is again circumferential mural thickening and heterogeneous hypoattenuation no evidence of bowel wall thickening, distention, or inflammatory changes. Colonic diverticulosis without acute diverticulitis. Normal appendix. Vascular/Lymphatic: Aortic atherosclerosis. New focal hypoattenuation within the upstream portal vein (2:26). No enlarged abdominal or pelvic lymph nodes. Reproductive: Markedly enlarged prostate gland. Other: No free fluid, fluid collection, or free air. Musculoskeletal: No acute or abnormal lytic or blastic osseous lesions. Multilevel degenerative changes of the partially imaged thoracic and lumbar spine. Small fat-containing paraumbilical hernia. IMPRESSION: 1. Markedly distended stomach to the level of the proximal duodenum where there is again circumferential mural thickening and heterogeneous hypoattenuation at the site of previously noted duodenitis. Findings suspicious for gastric outlet obstruction. 2. New focal hypoattenuation within the upstream portal vein, suspicious for nonocclusive thrombosis. 3. Partially imaged hypodensity within a right lower lobe pulmonary artery may be artifactual or represent a pulmonary embolism. 4. Mild peripheral hypodensity involving the pancreatic head and neck adjacent to the duodenal inflammation, which may be reactive. 5. Circumferential mural thickening of the distal esophagus, which may be due to esophagitis. 6. Markedly enlarged prostate gland. 7. Mobile stones within the urinary bladder. 8.  Aortic Atherosclerosis  (ICD10-I70.0). Critical Value/emergent results were called by telephone at the time of interpretation on 10/23/2023 at 1:51 pm to provider [facility], who verbally acknowledged these results. Electronically Signed   By: Agustin Cree M.D.   On: 10/23/2023 13:55   DG Chest Portable 1 View  Result Date: 10/23/2023 CLINICAL DATA:  Weakness. EXAM: PORTABLE CHEST 1 VIEW COMPARISON:  December 08, 2022. FINDINGS: The heart size and mediastinal contours are within normal limits. Both lungs are clear. The visualized skeletal structures are unremarkable. IMPRESSION: No active disease. Electronically Signed   By: Lupita Raider M.D.   On: 10/23/2023 12:11    Impression: 72 y.o. year old male with history of COPD, diabetes, ischemic colitis in 2021, HLD, HTN, depression, chronic epigastric pain, constipation, esophageal wall thickening, duodenitis noted on imaging this year, recent admission earlier this month with pneumonia, concerns for gastric outlet obstruction in the setting of duodenal inflammation on CT.  EGD completed 10/02/2023 showing esophagitis, few nonbleeding superficial gastric ulcers in the gastric body, duodenitis in the second portion of the duodenum with visualization difficult due to presence of food and angulation of the duodenum with recommendations to repeat EGD in 1-2 weeks, maintain on PPI twice daily and Carafate.   He has now returning to the emergency room today with decreased p.o. intake, worsening postprandial  abdominal pain, belching with regurgitation, weakness. CT with marked distention of the stomach to the level of the duodenum where there is circumferential thickening and heterogenous hypoattenuation at the site of the patient's previous duodenitis. Also with new hypodensity upstream portal vein suspicious for nonocclusive thrombus. There is also a partially imaged hypodensity in the right upper lobe pulmonary artery which may represent artifact versus PE. GI consulted due to  concerns for GOO.   Postprandial abdominal pain, belching/regurgitation of food, possible GOO on CT:  Likely secondary to severe duodenitis +/- duodenal stricture secondary to daily BC powders.  Unfortunately, due to retained food at the time of prior EGD on 10/15, we were unable to visualize this area.  Considering patient's worsening symptoms, marked dilation of the stomach and proximal duodenum, he will need placement of NG tube for decompression followed by EGD for further evaluation.   Possible PE/possible portal vein thrombus:  Hospitalist has started patient on heparin with plans for CTA chest abdomen pelvis on 11/6 if renal function remains stable.  We have recommended liver Doppler.  Abnormal pancreatic imaging:  CT this admission with mild peripheral hypodensity involving the pancreatic head and neck adjacent to the duodenal inflammation, which may be reactive. This was seen on previous imaging as well with recommendations for outpatient dedicated imaging of the pancreas.   Plan: NPO NG tube for decompression Proceed with upper endoscopy with propofol by Dr. Jena Gauss tomorrow. The risks, benefits, and alternatives have been discussed with the patient in detail. The patient states understanding and desires to proceed. Will need to hold heparin at 6am.   IV PPI BID Follow-up on Liver Doppler and CTA.  Need outpatient dedicated pancreatic imaging.    LOS: 0 days    10/23/2023, 2:58 PM   Ermalinda Memos, [facility]

## 2023-10-24 ENCOUNTER — Inpatient Hospital Stay (HOSPITAL_COMMUNITY): Payer: 59

## 2023-10-24 ENCOUNTER — Encounter (HOSPITAL_COMMUNITY): Payer: Self-pay | Admitting: Internal Medicine

## 2023-10-24 ENCOUNTER — Inpatient Hospital Stay (HOSPITAL_COMMUNITY): Payer: 59 | Admitting: Certified Registered Nurse Anesthetist

## 2023-10-24 ENCOUNTER — Encounter (HOSPITAL_COMMUNITY): Admission: EM | Disposition: E | Payer: Self-pay | Source: Home / Self Care | Attending: Family Medicine

## 2023-10-24 DIAGNOSIS — I81 Portal vein thrombosis: Secondary | ICD-10-CM | POA: Diagnosis not present

## 2023-10-24 DIAGNOSIS — J449 Chronic obstructive pulmonary disease, unspecified: Secondary | ICD-10-CM | POA: Diagnosis not present

## 2023-10-24 DIAGNOSIS — K315 Obstruction of duodenum: Secondary | ICD-10-CM

## 2023-10-24 DIAGNOSIS — R933 Abnormal findings on diagnostic imaging of other parts of digestive tract: Secondary | ICD-10-CM

## 2023-10-24 DIAGNOSIS — K2980 Duodenitis without bleeding: Secondary | ICD-10-CM

## 2023-10-24 DIAGNOSIS — K3189 Other diseases of stomach and duodenum: Secondary | ICD-10-CM

## 2023-10-24 DIAGNOSIS — T18128A Food in esophagus causing other injury, initial encounter: Secondary | ICD-10-CM | POA: Diagnosis not present

## 2023-10-24 DIAGNOSIS — J432 Centrilobular emphysema: Secondary | ICD-10-CM | POA: Diagnosis not present

## 2023-10-24 DIAGNOSIS — N1831 Chronic kidney disease, stage 3a: Secondary | ICD-10-CM | POA: Diagnosis not present

## 2023-10-24 DIAGNOSIS — R1110 Vomiting, unspecified: Secondary | ICD-10-CM

## 2023-10-24 HISTORY — PX: ESOPHAGOGASTRODUODENOSCOPY (EGD) WITH PROPOFOL: SHX5813

## 2023-10-24 HISTORY — PX: BIOPSY: SHX5522

## 2023-10-24 LAB — BASIC METABOLIC PANEL
Anion gap: 12 (ref 5–15)
BUN: 18 mg/dL (ref 8–23)
CO2: 26 mmol/L (ref 22–32)
Calcium: 9.3 mg/dL (ref 8.9–10.3)
Chloride: 96 mmol/L — ABNORMAL LOW (ref 98–111)
Creatinine, Ser: 1.24 mg/dL (ref 0.61–1.24)
GFR, Estimated: >60 mL/min (ref >60)
Glucose, Bld: 80 mg/dL (ref 70–99)
Potassium: 2.8 mmol/L — ABNORMAL LOW (ref 3.5–5.1)
Sodium: 134 mmol/L — ABNORMAL LOW (ref 135–145)

## 2023-10-24 LAB — GLUCOSE, CAPILLARY
Glucose-Capillary: 93 mg/dL (ref 70–99)
Glucose-Capillary: 78 mg/dL (ref 70–99)
Glucose-Capillary: 125 mg/dL — ABNORMAL HIGH (ref 70–99)
Glucose-Capillary: 122 mg/dL — ABNORMAL HIGH (ref 70–99)

## 2023-10-24 LAB — MAGNESIUM: Magnesium: 1.7 mg/dL (ref 1.7–2.4)

## 2023-10-24 LAB — CBC
HCT: 31.2 % — ABNORMAL LOW (ref 39.0–52.0)
Hemoglobin: 9.9 g/dL — ABNORMAL LOW (ref 13.0–17.0)
MCH: 28.4 pg (ref 26.0–34.0)
MCHC: 31.7 g/dL (ref 30.0–36.0)
MCV: 89.4 fL (ref 80.0–100.0)
Platelets: 279 10*3/uL (ref 150–400)
RBC: 3.49 MIL/uL — ABNORMAL LOW (ref 4.22–5.81)
RDW: 16.8 % — ABNORMAL HIGH (ref 11.5–15.5)
WBC: 5.9 10*3/uL (ref 4.0–10.5)
nRBC: 0 % (ref 0.0–0.2)

## 2023-10-24 LAB — KAPPA/LAMBDA LIGHT CHAINS
Kappa free light chain: 24.3 mg/L — ABNORMAL HIGH (ref 3.3–19.4)
Kappa, lambda light chain ratio: 1.03 (ref 0.26–1.65)
Lambda free light chains: 23.5 mg/L (ref 5.7–26.3)

## 2023-10-24 LAB — HEPARIN LEVEL (UNFRACTIONATED): Heparin Unfractionated: 0.26 [IU]/mL — ABNORMAL LOW (ref 0.30–0.70)

## 2023-10-24 LAB — MRSA NEXT GEN BY PCR, NASAL: MRSA by PCR Next Gen: NOT DETECTED

## 2023-10-24 SURGERY — ESOPHAGOGASTRODUODENOSCOPY (EGD) WITH PROPOFOL
Anesthesia: General

## 2023-10-24 MED ORDER — HEPARIN (PORCINE) 25000 UT/250ML-% IV SOLN
1600.0000 [IU]/h | INTRAVENOUS | Status: AC
  Administered 2023-10-24 – 2023-10-25 (×2): 1600 [IU]/h via INTRAVENOUS
  Filled 2023-10-24 (×2): qty 250

## 2023-10-24 MED ORDER — IOHEXOL 350 MG/ML SOLN
75.0000 mL | Freq: Once | INTRAVENOUS | Status: AC | PRN
  Administered 2023-10-24: 75 mL via INTRAVENOUS

## 2023-10-24 MED ORDER — LACTATED RINGERS IV SOLN
INTRAVENOUS | Status: DC | PRN
Start: 2023-10-24 — End: 2023-10-24

## 2023-10-24 MED ORDER — DOCUSATE SODIUM 50 MG/5ML PO LIQD
100.0000 mg | Freq: Two times a day (BID) | ORAL | Status: DC
  Administered 2023-10-25 – 2023-10-30 (×11): 100 mg
  Filled 2023-10-24 (×13): qty 10

## 2023-10-24 MED ORDER — FENTANYL CITRATE PF 50 MCG/ML IJ SOSY
25.0000 ug | PREFILLED_SYRINGE | INTRAMUSCULAR | Status: DC | PRN
  Administered 2023-10-24: 50 ug via INTRAVENOUS
  Administered 2023-10-24: 75 ug via INTRAVENOUS
  Filled 2023-10-24: qty 1
  Filled 2023-10-24: qty 2

## 2023-10-24 MED ORDER — FENTANYL 2500MCG IN NS 250ML (10MCG/ML) PREMIX INFUSION
0.0000 ug/h | INTRAVENOUS | Status: AC
Start: 2023-10-24 — End: 2023-10-25
  Administered 2023-10-24: 25 ug/h via INTRAVENOUS
  Filled 2023-10-24: qty 250

## 2023-10-24 MED ORDER — PROPOFOL 10 MG/ML IV BOLUS
INTRAVENOUS | Status: DC | PRN
  Administered 2023-10-24: 150 mg via INTRAVENOUS

## 2023-10-24 MED ORDER — DEXTROSE 50 % IV SOLN
INTRAVENOUS | Status: AC
  Filled 2023-10-24: qty 50

## 2023-10-24 MED ORDER — POTASSIUM CHLORIDE 10 MEQ/100ML IV SOLN
10.0000 meq | INTRAVENOUS | Status: AC
  Administered 2023-10-24 (×4): 10 meq via INTRAVENOUS
  Filled 2023-10-24 (×4): qty 100

## 2023-10-24 MED ORDER — PROPOFOL 500 MG/50ML IV EMUL
INTRAVENOUS | Status: DC | PRN
  Administered 2023-10-24: 75 ug/kg/min via INTRAVENOUS

## 2023-10-24 MED ORDER — PROPOFOL 10 MG/ML IV BOLUS
INTRAVENOUS | Status: AC
Start: 2023-10-24 — End: ?
  Filled 2023-10-24: qty 20

## 2023-10-24 MED ORDER — POLYETHYLENE GLYCOL 3350 17 G PO PACK
17.0000 g | PACK | Freq: Every day | ORAL | Status: DC
Start: 2023-10-24 — End: 2023-10-30
  Administered 2023-10-25 – 2023-10-30 (×6): 17 g
  Filled 2023-10-24 (×6): qty 1

## 2023-10-24 MED ORDER — SUCCINYLCHOLINE CHLORIDE 200 MG/10ML IV SOSY
PREFILLED_SYRINGE | INTRAVENOUS | Status: DC | PRN
  Administered 2023-10-24: 140 mg via INTRAVENOUS

## 2023-10-24 MED ORDER — ONDANSETRON HCL 4 MG/2ML IJ SOLN
INTRAMUSCULAR | Status: DC | PRN
  Administered 2023-10-24: 4 mg via INTRAVENOUS

## 2023-10-24 MED ORDER — DEXTROSE 50 % IV SOLN
INTRAVENOUS | Status: DC | PRN
  Administered 2023-10-24: .5 via INTRAVENOUS

## 2023-10-24 MED ORDER — SODIUM CHLORIDE 0.9 % IV SOLN: INTRAVENOUS | Status: DC

## 2023-10-24 MED ORDER — MIDAZOLAM HCL 2 MG/2ML IJ SOLN
1.0000 mg | INTRAMUSCULAR | Status: DC | PRN
  Administered 2023-10-24 (×5): 2 mg via INTRAVENOUS
  Administered 2023-10-25: 1 mg via INTRAVENOUS
  Administered 2023-10-25: 2 mg via INTRAVENOUS
  Filled 2023-10-24 (×7): qty 2

## 2023-10-24 MED ORDER — LIDOCAINE HCL (PF) 2 % IJ SOLN
INTRAMUSCULAR | Status: DC | PRN
  Administered 2023-10-24: 80 mg via INTRADERMAL

## 2023-10-24 MED ORDER — CHLORHEXIDINE GLUCONATE CLOTH 2 % EX PADS
6.0000 | MEDICATED_PAD | Freq: Every day | CUTANEOUS | Status: DC
Start: 2023-10-24 — End: 2023-11-09
  Administered 2023-10-24 – 2023-11-09 (×19): 6 via TOPICAL

## 2023-10-24 MED ORDER — PHENYLEPHRINE HCL (PRESSORS) 10 MG/ML IV SOLN
INTRAVENOUS | Status: DC | PRN
  Administered 2023-10-24 (×4): 80 ug via INTRAVENOUS

## 2023-10-24 MED ORDER — ROCURONIUM BROMIDE 100 MG/10ML IV SOLN
INTRAVENOUS | Status: DC | PRN
  Administered 2023-10-24: 30 mg via INTRAVENOUS

## 2023-10-24 MED ORDER — FENTANYL CITRATE PF 50 MCG/ML IJ SOSY
25.0000 ug | PREFILLED_SYRINGE | INTRAMUSCULAR | Status: DC | PRN
Start: 2023-10-24 — End: 2023-10-25

## 2023-10-24 NOTE — Anesthesia Procedure Notes (Signed)
Procedure Name: Intubation Date/Time: 10/24/2023 3:11 PM  Performed by: Deri Fuelling, CRNAPre-anesthesia Checklist: Patient identified, Emergency Drugs available, Suction available and Patient being monitored Patient Re-evaluated:Patient Re-evaluated prior to induction Oxygen Delivery Method: Circle system utilized Preoxygenation: Pre-oxygenation with 100% oxygen Induction Type: IV induction Ventilation: Mask ventilation without difficulty Laryngoscope Size: Mac and 4 Grade View: Grade I Tube type: Oral Tube size: 7.5 mm Number of attempts: 1 Airway Equipment and Method: Stylet and Oral airway Placement Confirmation: ETT inserted through vocal cords under direct vision, positive ETCO2 and breath sounds checked- equal and bilateral Secured at: 22 cm Tube secured with: Tape Dental Injury: Teeth and Oropharynx as per pre-operative assessment

## 2023-10-24 NOTE — Anesthesia Preprocedure Evaluation (Signed)
Anesthesia Evaluation  Patient identified by MRN, date of birth, ID band Patient awake    Reviewed: Allergy & Precautions, NPO status , Patient's Chart, lab work & pertinent test results, reviewed documented beta blocker date and time   Airway Mallampati: I  TM Distance: >3 FB Neck ROM: Full    Dental  (+) Edentulous Upper, Edentulous Lower   Pulmonary shortness of breath, COPD,  COPD inhaler, Current Smoker and Patient abstained from smoking. IMPRESSION: 1. Lung-RADS 2, benign appearance or behavior. Continue annual screening with low-dose chest CT without contrast in 12 months. 2. Stable mildly dilated ascending thoracic aorta, measuring up 4.3 cm. Recommend continued attention on annual lung cancer screening CT. 3. Severe left main and three-vessel coronary artery calcifications. 4. Aortic Atherosclerosis (ICD10-I70.0) and Emphysema (ICD10-J43.9).   Electronically Signed: By: Allegra Lai M.D. On: 03/06/2023 17:07      Pulmonary exam normal breath sounds clear to auscultation       Cardiovascular Exercise Tolerance: Poor hypertension, Pt. on medications and Pt. on home beta blockers + CAD and + DOE  Normal cardiovascular exam Rhythm:Regular Rate:Normal  22-Apr-2023 16:25:14 [facility] ROUTINE RECORD [birth year] (72 yr) Male Black Vent. rate 90 BPM PR interval 182 ms QRS duration 97 ms QT/QTcB 378/463 ms P-R-T axes -17 -68 6 Sinus rhythm Left anterior fascicular block RSR' in V1 or V2, probably normal variant Left ventricular hypertrophy Anterior Q waves, possibly due to LVH Baseline wander in lead(s) V5 Confirmed by Vivi Barrack [id number]) on 04/22/2023 5:09:32 PM   Neuro/Psych  PSYCHIATRIC DISORDERS Anxiety Depression  Schizophrenia     GI/Hepatic PUD,GERD  Medicated and Controlled,,  Endo/Other  diabetes, Well Controlled, Type 2, Oral Hypoglycemic Agents    Renal/GU Renal  InsufficiencyRenal disease Bladder dysfunction (BPH)      Musculoskeletal  (+) Arthritis , Osteoarthritis,    Abdominal   Peds  Hematology  (+) Blood dyscrasia, anemia   Anesthesia Other Findings   Reproductive/Obstetrics                             Anesthesia Physical Anesthesia Plan  ASA: 3 and emergent  Anesthesia Plan: General   Post-op Pain Management: Minimal or no pain anticipated   Induction: Intravenous  PONV Risk Score and Plan: 1 and Treatment may vary due to age or medical condition and Propofol infusion  Airway Management Planned: Nasal Cannula and Natural Airway  Additional Equipment:   Intra-op Plan:   Post-operative Plan:   Informed Consent: I have reviewed the patients History and Physical, chart, labs and discussed the procedure including the risks, benefits and alternatives for the proposed anesthesia with the patient or authorized representative who has indicated his/her understanding and acceptance.       Plan Discussed with: CRNA and Surgeon  Anesthesia Plan Comments:        Anesthesia Quick Evaluation

## 2023-10-24 NOTE — TOC CM/SW Note (Signed)
Transition of Care [facility]) - Inpatient Brief Assessment   Patient Details  Name: Matthew Molina MRN: [id number] Date of Birth: 1951-01-03  Transition of Care [facility]) CM/SW Contact:    Villa Herb, LCSWA Phone Number: 10/24/2023, 11:52 AM   Clinical Narrative: Pt with recent hospital admission. During admission pt was assessed. Per assessment lives alone. Pt has a nurses aide Monday though Saturday. [name] (friend/neighbor) states friends and family provide transportation when needed. Pt has a cane and walker to use when needed. [name] states pt has assistance that is needed in home and when aides are not available friends and family assist. TOC continues to follow.   Transition of Care Asessment: Insurance and Status: Insurance coverage has been reviewed Patient has primary care physician: Yes Home environment has been reviewed: from home Prior level of function:: has assistance Prior/Current Home Services: Current home services Social Determinants of Health Reivew: SDOH reviewed no interventions necessary Readmission risk has been reviewed: Yes Transition of care needs: no transition of care needs at this time

## 2023-10-24 NOTE — Progress Notes (Signed)
PHARMACY - ANTICOAGULATION Pharmacy Consult for heparin Indication:  VTE treatment Brief A/P: Heparin level subtherapeutic Increase Heparin rate  Allergies  Allergen Reactions   Asa [Aspirin] Other (See Comments)    Stomach pain   Crestor [Rosuvastatin] Other (See Comments)    Stomach pain   Morphine Nausea And Vomiting   Wellbutrin [Bupropion] Other (See Comments)    Stomach pain   Zoloft [Sertraline Hcl] Other (See Comments)    Stomach pain    Patient Measurements: Height: 6' (182.9 cm) Weight: 88 kg (194 lb 0.1 oz) IBW/kg (Calculated) : 77.6 Heparin Dosing Weight: 88 kg  Vital Signs: Temp: 97.1 F (36.2 C) (11/06 1611) Temp Source: Axillary (11/06 1611) BP: 152/82 (11/06 1330)  Labs: Recent Labs    10/23/23 1000 10/24/23 0002 10/24/23 0852  HGB 11.1* 9.9*  --   HCT 35.2* 31.2*  --   PLT 332 279  --   HEPARINUNFRC  --  0.26*  --   CREATININE 1.53*  --  1.24    Estimated Creatinine Clearance: 58.2 mL/min (by C-G formula based on SCr of 1.24 mg/dL).  Assessment: 72 y.o. male with possible PE for heparin.  Ultrasound negative for portal vein thrombosis  Goal of Therapy:  Heparin level 0.3-0.7 units/ml Monitor platelets by anticoagulation protocol: Yes   Plan:  Per GI will restart heparin now Restart at previous rate of heparin 1600 units/hr Heparin level in 8 hours Monitor CBC daily  Paulita Fujita, PharmD Clinical  Pharmacist 10/24/2023 7:26 PM

## 2023-10-24 NOTE — Progress Notes (Signed)
PHARMACY - ANTICOAGULATION Pharmacy Consult for heparin Indication:  VTE treatment Brief A/P: Heparin level subtherapeutic Increase Heparin rate  Allergies  Allergen Reactions   Asa [Aspirin] Other (See Comments)    Stomach pain   Crestor [Rosuvastatin] Other (See Comments)    Stomach pain   Wellbutrin [Bupropion] Other (See Comments)    Stomach pain   Zoloft [Sertraline Hcl] Other (See Comments)    Stomach pain    Patient Measurements: Height: 6' (182.9 cm) Weight: 88 kg (194 lb) IBW/kg (Calculated) : 77.6 Heparin Dosing Weight: 88 kg  Vital Signs: Temp: 98.2 F (36.8 C) (11/05 2151) Temp Source: Oral (11/05 2151) BP: 149/97 (11/05 2151) Pulse Rate: 99 (11/05 2151)  Labs: Recent Labs    10/23/23 1000 10/24/23 0002  HGB 11.1* 9.9*  HCT 35.2* 31.2*  PLT 332 279  HEPARINUNFRC  --  0.26*  CREATININE 1.53*  --     Estimated Creatinine Clearance: 47.2 mL/min (A) (by C-G formula based on SCr of 1.53 mg/dL (H)).  Assessment: 72 y.o. male with possible PE for heparin.  Ultrasound negative for portal vein thrombosis  Goal of Therapy:  Heparin level 0.3-0.7 units/ml Monitor platelets by anticoagulation protocol: Yes   Plan:  Increase Heparin 1600 units/hr F/U after EGD today  Geannie Risen, PharmD, BCPS 10/24/2023 12:46 AM

## 2023-10-24 NOTE — Progress Notes (Signed)
Patient transported from ICU to CT and back without any complications. 

## 2023-10-24 NOTE — Op Note (Signed)
[facility] Patient Name: Matthew Molina Procedure Date: 10/24/2023 2:17 PM MRN: [id number] Date of Birth: [birth year] Attending MD: Gennette Pac , MD, [id number] CSN: [id number] Age: 72 Admit Type: Inpatient Procedure:                Upper GI endoscopy Indications:              Abnormal CT of the GI tract Providers:                Gennette Pac, MD, Crystal Page, Elinor Parkinson Referring MD:              Medicines:                General Anesthesia /intubation Complications:            No immediate complications. Estimated Blood Loss:     Estimated blood loss was minimal. Procedure:                Pre-Anesthesia Assessment:                           - Prior to the procedure, a History and Physical                            was performed, and patient medications and                            allergies were reviewed. The patient's tolerance of                            previous anesthesia was also reviewed. The risks                            and benefits of the procedure and the sedation                            options and risks were discussed with the patient.                            All questions were answered, and informed consent                            was obtained. Prior Anticoagulants: The patient                            last took heparin 1 day prior to the procedure. ASA                            Grade Assessment: III - A patient with severe                            systemic disease. After reviewing the risks and  benefits, the patient was deemed in satisfactory                            condition to undergo the procedure.                           After obtaining informed consent, the endoscope was                            passed under direct vision. Throughout the                            procedure, the patient's blood pressure, pulse, and                            oxygen  saturations were monitored continuously. The                            [id number]-[id number] ([id number]) scope was introduced through the                            mouth, and advanced to the second part of duodenum.                            The upper GI endoscopy was accomplished without                            difficulty. The patient tolerated the procedure                            well. Scope In: 3:00:20 PM Scope Out: 3:29:55 PM Total Procedure Duration: 0 hours 29 minutes 35 seconds  Findings:      Air-fluid column in the esophagus with food debris ball valving in the       tubular esophagus..      No esophageal obstruction. Easily traversed the esophagus with the       scope. Gastric cavity hugely dilated containing a large amount of       fluid/food. I suctioned out at least 2500 cc of fluid with that much       more liquid and food debris remaining. Gastric cavity elongated and       dilated. Unable to intubate the pyloric channel due to the gastroscope       not being long enough. I withdrew the gastric and obtained the pediatric       colonoscope; I was able to then easily advance across ( the widely       patent ) pyloric channel into the proximal duodenum. I found another       food/ fluid fluid level with marked inflammatory changes of the D2       mucosa extending about 10 cm. This was producing significant       encroachment on the the duodenal lumen duodenal lumen. I was able to       negotiate through this stenotic area with the peds colonoscope and reach       the distal extent of stricture where the duodenum appeared to  open up;       downstream appeared normal. Biopsies of the abnormal duodenal mucosa       were taken for histologic study. Obviously, there was a large part of       the gastric mucosa which was not seen. Impression:               - High-grade duodenal obstruction producing massive                            gastric dilation.                           -10 cm  segment of proximal D2 mucosa edematous and                            inflamed appearing - cannot rule out underlying                            malignancy. Mucosa was soft at this level. Did not                            identify an ulcer.                           -Process appears to be worsening over time without                            a discrete etiology. Moderate Sedation:      Moderate (conscious) sedation was personally administered by an       anesthesia professional. The following parameters were monitored: oxygen       saturation, heart rate, blood pressure, respiratory rate, EKG, adequacy       of pulmonary ventilation, and response to care. Recommendation:           - Return patient to ICU for ongoing care.                           - NPO. Patient will be left intubated in the ICU                            overnight for airway protection. NG tube placed in                            endoscopy.                           -Continue PPI for acid suppression.                           -Will follow-up on pathology. Would consider a CTA                            of the abdomen to make sure that this is not a  focally ischemic process. If no ischemia, I feel                            patient would be best served by a gastrojejunostomy                            for drainage.                           -Further recommendations to follow. At patient                            request, I called Orson Aloe at [phone]                            -updated her as to patient's condition. Her                            questions have been answered. Procedure Code(s):        --- Professional ---                           [phone], Esophagogastroduodenoscopy, flexible,                            transoral; diagnostic, including collection of                            specimen(s) by brushing or washing, when performed                            (separate  procedure) Diagnosis Code(s):        --- Professional ---                           R93.3, Abnormal findings on diagnostic imaging of                            other parts of digestive tract CPT copyright 2022 [employer]. All rights reserved. The codes documented in this report are preliminary and upon coder review may  be revised to meet current compliance requirements. Gerrit Friends. Alexios Keown, MD Gennette Pac, MD 10/24/2023 3:58:47 PM This report has been signed electronically. Number of Addenda: 0

## 2023-10-24 NOTE — Interval H&P Note (Signed)
History and Physical Interval Note:  10/24/2023 2:50 PM  ESAI STECKLEIN  has presented today for surgery, with the diagnosis of food regurgitation, duodenitis with concern for GOO on CT.  The various methods of treatment have been discussed with the patient and family. After consideration of risks, benefits and other options for treatment, the patient has consented to  Procedure(s): ESOPHAGOGASTRODUODENOSCOPY (EGD) WITH PROPOFOL (N/A) as a surgical intervention.  The patient's history has been reviewed, patient examined, no change in status, stable for surgery.  I have reviewed the patient's chart and labs.  Questions were answered to the patient's satisfaction.     Eula Listen    Patient seen and examined in short stay.  Agree with need for EGD.  Previous presentation did not reveal a mechanical obstruction on EGD.  I wonder if there is a component of gastroparesis here.  Plus minus ischemia.  I have offered the patient a EGD today. The risks, benefits, limitations, alternatives and imponderables have been reviewed with the patient. Potential for esophageal dilation, biopsy, etc. have also been reviewed.  Questions have been answered. All parties agreeable.     Incidentally patient states he is very hungry and wants to eat.

## 2023-10-24 NOTE — Progress Notes (Signed)
PROGRESS NOTE   ABDULAHAD Molina  LKG:[id number] DOB: 23-May-1951 DOA: 10/23/2023 PCP: Matthew Perches, MD   No chief complaint on file.  Level of care: ICU  Brief Admission History:  72 year old male with a history of IgG MGUS, COPD, diabetes mellitus type 2, hypertension, hyperlipidemia, depression, tobacco abuse, coronary disease presenting with generalized weakness, abdominal pain, nausea and vomiting. The patient was recently admitted to the hospital from 09/30/2023 to 10/03/2023.  At that time, the patient had nausea and vomiting and poor oral intake and upper abdominal pain as well.  The patient was treated for sepsis secondary to aspiration pneumonia.  GI was consulted during the admission.  The patient underwent EGD on 10/02/2023 which showed grade B esophagitis without bleeding, nonbleeding gastric ulcers with a clean base, and duodenitis.  The patient was discharged home with PPI twice daily and sulcrafate.  The patient states that he was feeling well after d/c and he was tolerating a soft diet. However, he but began having gradual abdominal pain and belching in the past 1 week.  He states that things have gradually worsened over the past 3 days prior to admission with increasing belching and hiccups that has resulted in vomiting every time he belches.  He has had poor oral intake and progressive generalized weakness.  He also complains of worsening mid abdominal pain.  He denies f/c, cp, sob, hemoptysis, diarrhea, hematochezia, melena, dysuria, hematuria.  As result of abd pain with n/v, the patient presented for further evaluation and treatment.  He continues to smoke a few cigarette's a day. Notably, patient states he ate spaghetti and chocolate chip cookies before he came to ED and did not have any emesis.   In the ED, the patient was afebrile hemodynamically stable with oxygen saturation 92% on room air.  WBC 6.0, hemoglobin 11.1, platelets 332.  Sodium 132, potassium 3.1, bicarbonate  28, serum creatinine 1.53.  LFTs were unremarkable.  Lipase 84.  CT of the abdomen and pelvis showed marked distention of the stomach to the level of the duodenum where there is circumferential thickening and heterogenous hypoattenuation at the site of the patient's previous duodenitis. There was a new hypodensity upstream portal vein suspicious for nonocclusive thrombus.  There is also a partially imaged hypodensity in the right upper lobe pulmonary artery which may represent artifact versus PE. The patient was started on IV heparin. -GI was consulted to assist with management.   Assessment and Plan:  Severe distal duodenal obstruction  - discussed with GI, plan is to keep intubated post EGD overnight, extubate in AM  - GI recommends CTA abdomen to rule out ischemic process, if no ischemia found plan to consult general surgery for gastrojejunostomy for drainage  - NG tube in place  - continue pantoprazole   Question of portal venous thrombosis Question of PE - CTA chest/abd/pelvis pending  - pt had initially been started on IV heparin infusion - currently on hold due to procedure and biopsies  - resume per GI when safe   CKD stage IIIa -Baseline creatinine 1.2-1.5 -Monitor daily BMP   COPD -Stable on room air -Restart BDs -Continue Singulair when able to tolerate p.o.   Tobacco abuse -Tobacco cessation discussed   Depression/anxiety -restart risperdal and buspar when able to tolerate po   Controlled diabetes mellitus type 2 -10/07/2023 hemoglobin A1c 6.0   BPH -Restart tamsulosin when able to tolerate p.o   DVT prophylaxis:  SCDs Code Status: FULL  Family Communication:  Disposition: transferred to  ICU    Consultants:  GI   Procedures:  EGD 10/24/23   Antimicrobials:    Subjective: Pt sedated on vent post EGD.   Objective: Vitals:   10/24/23 1330 10/24/23 1603 10/24/23 1611 10/24/23 1627  BP: (!) 152/82     Pulse:      Resp: (!) 23     Temp: 98.4 F  (36.9 C)  (!) 97.1 F (36.2 C)   TempSrc:   Axillary   SpO2: 95% 100% 100% 99%  Weight:   88 kg   Height:   6' (1.829 m)     Intake/Output Summary (Last 24 hours) at 10/24/2023 1732 Last data filed at 10/24/2023 0604 Gross per 24 hour  Intake 768.28 ml  Output 400 ml  Net 368.28 ml   Filed Weights   10/23/23 0909 10/24/23 1611  Weight: 88 kg 88 kg   Examination:  General exam: critically ill appearing, intubated.  Sedated.   Respiratory system: mechanically vented. BBS heard.  Cardiovascular system: normal S1 & S2 heard. No JVD, murmurs, rubs, gallops or clicks. No pedal edema. Gastrointestinal system: Abdomen is distended, soft. No organomegaly or masses felt. Normal bowel sounds heard. Central nervous system: sedated.  No focal neurological deficits. Extremities: Symmetric 5 x 5 power. Skin: No rashes, lesions or ulcers. Psychiatry: Judgement and insight UTD   Data Reviewed: I have personally reviewed following labs and imaging studies  CBC: Recent Labs  Lab 10/23/23 1000 10/24/23 0002  WBC 6.0 5.9  NEUTROABS 4.3  --   HGB 11.1* 9.9*  HCT 35.2* 31.2*  MCV 89.6 89.4  PLT 332 279    Basic Metabolic Panel: Recent Labs  Lab 10/23/23 1000 10/24/23 0852  NA 132* 134*  K 3.1* 2.8*  CL 91* 96*  CO2 26 26  GLUCOSE 151* 80  BUN 20 18  CREATININE 1.53* 1.24  CALCIUM 10.3 9.3  MG  --  1.7    CBG: Recent Labs  Lab 10/24/23 1321 10/24/23 1426 10/24/23 1602  GLUCAP 93 78 125*    No results found for this or any previous visit (from the past 240 hour(s)).   Radiology Studies: DG Abdomen 1 View  Result Date: 10/23/2023 CLINICAL DATA:  Nasogastric tube placement EXAM: ABDOMEN - 1 VIEW COMPARISON:  None Available. FINDINGS: The nasogastric tube is looped within the distal esophagus with its tip within the distal esophagus overlying T7. The abdominal gas pattern is nonspecific due to a paucity of intra-abdominal gas. Contrast is seen within a nondilated renal  collecting system bilaterally. IMPRESSION: 1. Nasogastric tube looped within the distal esophagus with its tip within the distal esophagus overlying T7. Repositioning is recommended. Electronically Signed   By: Helyn Numbers M.D.   On: 10/23/2023 18:16   US ABDOMEN LIMITED WITH LIVER DOPPLER  Result Date: 10/23/2023 CLINICAL DATA:  Portal vein thrombus EXAM: DUPLEX ULTRASOUND OF LIVER TECHNIQUE: Color and duplex Doppler ultrasound was performed to evaluate the hepatic in-flow and out-flow vessels. COMPARISON:  CT 10/23/2023 FINDINGS: Liver: Normal parenchymal echogenicity. Normal hepatic contour without nodularity. No focal lesion, mass or intrahepatic biliary ductal dilatation. Limited images of the gallbladder are unremarkable. Main Portal Vein size: 20 mm cm Portal Vein Velocities Main Prox:  69 cm/sec Main Mid: 48 cm/sec Main Dist:  40 cm/sec Right: 29 cm/sec Left: 29 cm/sec Flow within the main, right, and left portal veins is hepatopetal (antegrade) Hepatic Vein Velocities Right:  81 cm/sec Middle:  33 cm/sec Left:  36 cm/sec Flow within hepatic  veins demonstrates appropriate directional flow (hepatofugal) IVC: Present and patent with normal respiratory phasicity. Hepatic Artery Velocity:  95 cm/sec Splenic Vein Velocity:  39 cm/sec Spleen: 10.3 cm x 9.6 cm x 3.7 cm with a total volume of 191 cm^3 (411 cm^3 is upper limit normal) Portal Vein Occlusion/Thrombus: Not identified. Focal thrombus noted on prior CT examination is not visualized on the current exam. Splenic Vein Occlusion/Thrombus: No Ascites: None Varices: None IMPRESSION: 1. Normal hepatic Doppler examination. No evidence of portal vein thrombosis. Electronically Signed   By: Helyn Numbers M.D.   On: 10/23/2023 18:14   CT ABDOMEN PELVIS W CONTRAST  Result Date: 10/23/2023 CLINICAL DATA:  Several day history of abdominal pain and emesis EXAM: CT ABDOMEN AND PELVIS WITH CONTRAST TECHNIQUE: Multidetector CT imaging of the abdomen and pelvis  was performed using the standard protocol following bolus administration of intravenous contrast. RADIATION DOSE REDUCTION: This exam was performed according to the departmental dose-optimization program which includes automated exposure control, adjustment of the mA and/or kV according to patient size and/or use of iterative reconstruction technique. CONTRAST:  OMNIPAQUE IOHEXOL 300 MG/ML  SOLN COMPARISON:  CT abdomen and pelvis dated 09/30/2023 FINDINGS: Lower chest: No focal consolidation or pulmonary nodule in the lung bases. Pleural effusion. Partially imaged heart size is normal. Coronary artery calcifications. Partially imaged hypodensity within a right lower lobe pulmonary artery (2:1). Hepatobiliary: No focal hepatic lesions. No intra or extrahepatic biliary ductal dilation. Normal gallbladder. Pancreas: Mild peripheral hypodensity involving the pancreatic head and neck adjacent to the duodenal inflammation. No focal lesions or main ductal dilation. Spleen: Normal in size without focal abnormality. Adrenals/Urinary Tract: No adrenal nodules. Bilateral renal cysts, a few of which are minimally complicated. No specific follow-up imaging recommended. No hydronephrosis or calculi. Mobile stones within the urinary bladder. Stomach/Bowel: Circumferential mural thickening of the distal esophagus. Markedly distended stomach to the level of the proximal duodenum where there is again circumferential mural thickening and heterogeneous hypoattenuation no evidence of bowel wall thickening, distention, or inflammatory changes. Colonic diverticulosis without acute diverticulitis. Normal appendix. Vascular/Lymphatic: Aortic atherosclerosis. New focal hypoattenuation within the upstream portal vein (2:26). No enlarged abdominal or pelvic lymph nodes. Reproductive: Markedly enlarged prostate gland. Other: No free fluid, fluid collection, or free air. Musculoskeletal: No acute or abnormal lytic or blastic osseous  lesions. Multilevel degenerative changes of the partially imaged thoracic and lumbar spine. Small fat-containing paraumbilical hernia. IMPRESSION: 1. Markedly distended stomach to the level of the proximal duodenum where there is again circumferential mural thickening and heterogeneous hypoattenuation at the site of previously noted duodenitis. Findings suspicious for gastric outlet obstruction. 2. New focal hypoattenuation within the upstream portal vein, suspicious for nonocclusive thrombosis. 3. Partially imaged hypodensity within a right lower lobe pulmonary artery may be artifactual or represent a pulmonary embolism. 4. Mild peripheral hypodensity involving the pancreatic head and neck adjacent to the duodenal inflammation, which may be reactive. 5. Circumferential mural thickening of the distal esophagus, which may be due to esophagitis. 6. Markedly enlarged prostate gland. 7. Mobile stones within the urinary bladder. 8.  Aortic Atherosclerosis (ICD10-I70.0). Critical Value/emergent results were called by telephone at the time of interpretation on 10/23/2023 at 1:51 pm to provider [facility], who verbally acknowledged these results. Electronically Signed   By: Agustin Cree M.D.   On: 10/23/2023 13:55   DG Chest Portable 1 View  Result Date: 10/23/2023 CLINICAL DATA:  Weakness. EXAM: PORTABLE CHEST 1 VIEW COMPARISON:  December 08, 2022. FINDINGS: The heart  size and mediastinal contours are within normal limits. Both lungs are clear. The visualized skeletal structures are unremarkable. IMPRESSION: No active disease. Electronically Signed   By: Lupita Raider M.D.   On: 10/23/2023 12:11    Scheduled Meds:  arformoterol  15 mcg Nebulization BID   budesonide (PULMICORT) nebulizer solution  0.5 mg Nebulization BID   busPIRone  7.5 mg Oral TID   Chlorhexidine Gluconate Cloth  6 each Topical Daily   dextrose       docusate  100 mg Per Tube BID   ipratropium-albuterol  3 mL Nebulization TID   montelukast   10 mg Oral QHS   pantoprazole (PROTONIX) IV  40 mg Intravenous Q12H   polyethylene glycol  17 g Per Tube Daily   risperiDONE  1 mg Oral QHS   sucralfate  1 g Oral TID WC & HS   tamsulosin  0.4 mg Oral QPC supper   Continuous Infusions:  potassium chloride 10 mEq (10/24/23 1634)     LOS: 1 day   Critical Care Procedure Note Authorized and Performed by: Maryln Manuel MD  Total Critical Care time:  70 mins Due to a high probability of clinically significant, life threatening deterioration, the patient required my highest level of preparedness to intervene emergently and I personally spent this critical care time directly and personally managing the patient.  This critical care time included obtaining a history; examining the patient, pulse oximetry; ordering and review of studies; arranging urgent treatment with development of a management plan; evaluation of patient's response of treatment; frequent reassessment; and discussions with other providers.  This critical care time was performed to assess and manage the high probability of imminent and life threatening deterioration that could result in multi-organ failure.  It was exclusive of separately billable procedures and treating other patients and teaching time.    Standley Dakins, MD How to contact the [facility] Attending or Consulting provider 7A - 7P or covering provider during after hours 7P -7A, for this patient?  Check the care team in [facility] and look for a) attending/consulting [facility] provider listed and b) the [facility] team listed Log into [email or web address] to find provider on call.  Locate the [facility] provider you are looking for under [facility] and page to a number that you can be directly reached. If you still have difficulty reaching the provider, please page the [facility] (Director on Call) for the Hospitalists listed on amion for assistance.  10/24/2023, 5:32 PM

## 2023-10-24 NOTE — Transfer of Care (Signed)
Immediate Anesthesia Transfer of Care Note  Patient: Matthew Molina  Procedure(s) Performed: ESOPHAGOGASTRODUODENOSCOPY (EGD) WITH PROPOFOL BIOPSY  Patient Location: ICU  Anesthesia Type:General  Level of Consciousness: sedated and Patient remains intubated per anesthesia plan  Airway & Oxygen Therapy: Patient remains intubated per anesthesia plan and Patient placed on Ventilator (see vital sign flow sheet for setting)  Post-op Assessment: Report given to RN and Post -op Vital signs reviewed and stable  Post vital signs: Reviewed and stable  Last Vitals:  Vitals Value Taken Time  BP 96/73   Temp    Pulse 93 10/24/23 1601  Resp 18 10/24/23 1601  SpO2 100 % 10/24/23 1601  Vitals shown include unfiled device data.  Last Pain:  Vitals:   10/24/23 1452  TempSrc:   PainSc: 10-Worst pain ever      Patients Stated Pain Goal: 2 (10/23/23 1237)  Complications: No notable events documented.

## 2023-10-25 ENCOUNTER — Other Ambulatory Visit: Payer: Self-pay

## 2023-10-25 ENCOUNTER — Inpatient Hospital Stay (HOSPITAL_COMMUNITY): Payer: 59

## 2023-10-25 DIAGNOSIS — N1831 Chronic kidney disease, stage 3a: Secondary | ICD-10-CM | POA: Diagnosis not present

## 2023-10-25 DIAGNOSIS — Z720 Tobacco use: Secondary | ICD-10-CM | POA: Diagnosis not present

## 2023-10-25 DIAGNOSIS — K315 Obstruction of duodenum: Secondary | ICD-10-CM | POA: Diagnosis not present

## 2023-10-25 DIAGNOSIS — R142 Eructation: Secondary | ICD-10-CM | POA: Diagnosis not present

## 2023-10-25 DIAGNOSIS — I81 Portal vein thrombosis: Secondary | ICD-10-CM | POA: Diagnosis not present

## 2023-10-25 DIAGNOSIS — K311 Adult hypertrophic pyloric stenosis: Secondary | ICD-10-CM | POA: Diagnosis not present

## 2023-10-25 DIAGNOSIS — R109 Unspecified abdominal pain: Secondary | ICD-10-CM | POA: Diagnosis not present

## 2023-10-25 LAB — PROTEIN ELECTROPHORESIS, SERUM
A/G Ratio: 1.2 (ref 0.7–1.7)
Albumin ELP: 3.6 g/dL (ref 2.9–4.4)
Alpha-1-Globulin: 0.3 g/dL (ref 0.0–0.4)
Alpha-2-Globulin: 0.8 g/dL (ref 0.4–1.0)
Beta Globulin: 0.8 g/dL (ref 0.7–1.3)
Gamma Globulin: 1.1 g/dL (ref 0.4–1.8)
Globulin, Total: 3 g/dL (ref 2.2–3.9)
M-Spike, %: 0.6 g/dL — ABNORMAL HIGH
Total Protein ELP: 6.6 g/dL (ref 6.0–8.5)

## 2023-10-25 LAB — CBC
HCT: 35.3 % — ABNORMAL LOW (ref 39.0–52.0)
Hemoglobin: 10.7 g/dL — ABNORMAL LOW (ref 13.0–17.0)
MCH: 28.3 pg (ref 26.0–34.0)
MCHC: 30.3 g/dL (ref 30.0–36.0)
MCV: 93.4 fL (ref 80.0–100.0)
Platelets: 257 10*3/uL (ref 150–400)
RBC: 3.78 MIL/uL — ABNORMAL LOW (ref 4.22–5.81)
RDW: 17 % — ABNORMAL HIGH (ref 11.5–15.5)
WBC: 5.4 10*3/uL (ref 4.0–10.5)
nRBC: 0 % (ref 0.0–0.2)

## 2023-10-25 LAB — URINALYSIS, ROUTINE W REFLEX MICROSCOPIC
Bilirubin Urine: NEGATIVE
Glucose, UA: >=500 mg/dL — AB
Hgb urine dipstick: NEGATIVE
Ketones, ur: 5 mg/dL — AB
Leukocytes,Ua: NEGATIVE
Nitrite: NEGATIVE
Protein, ur: NEGATIVE mg/dL
Specific Gravity, Urine: 1.024 (ref 1.005–1.030)
pH: 7 (ref 5.0–8.0)

## 2023-10-25 LAB — COMPREHENSIVE METABOLIC PANEL
ALT: 15 U/L (ref 0–44)
AST: 10 U/L — ABNORMAL LOW (ref 15–41)
Albumin: 3.4 g/dL — ABNORMAL LOW (ref 3.5–5.0)
Alkaline Phosphatase: 30 U/L — ABNORMAL LOW (ref 38–126)
Anion gap: 16 — ABNORMAL HIGH (ref 5–15)
BUN: 20 mg/dL (ref 8–23)
CO2: 19 mmol/L — ABNORMAL LOW (ref 22–32)
Calcium: 9.6 mg/dL (ref 8.9–10.3)
Chloride: 101 mmol/L (ref 98–111)
Creatinine, Ser: 1.26 mg/dL — ABNORMAL HIGH (ref 0.61–1.24)
GFR, Estimated: >60 mL/min (ref >60)
Glucose, Bld: 109 mg/dL — ABNORMAL HIGH (ref 70–99)
Potassium: 3.5 mmol/L (ref 3.5–5.1)
Sodium: 136 mmol/L (ref 135–145)
Total Bilirubin: 1 mg/dL (ref <1.2)
Total Protein: 6.5 g/dL (ref 6.5–8.1)

## 2023-10-25 LAB — GLUCOSE, CAPILLARY
Glucose-Capillary: 129 mg/dL — ABNORMAL HIGH (ref 70–99)
Glucose-Capillary: 112 mg/dL — ABNORMAL HIGH (ref 70–99)
Glucose-Capillary: 79 mg/dL (ref 70–99)

## 2023-10-25 LAB — MAGNESIUM: Magnesium: 1.9 mg/dL (ref 1.7–2.4)

## 2023-10-25 LAB — HEPARIN LEVEL (UNFRACTIONATED)
Heparin Unfractionated: 0.48 [IU]/mL (ref 0.30–0.70)
Heparin Unfractionated: 0.63 [IU]/mL (ref 0.30–0.70)

## 2023-10-25 LAB — D-DIMER, QUANTITATIVE: D-Dimer, Quant: 3.65 ug{FEU}/mL — ABNORMAL HIGH (ref 0.00–0.50)

## 2023-10-25 MED ORDER — INSULIN ASPART 100 UNIT/ML IJ SOLN
0.0000 [IU] | Freq: Four times a day (QID) | INTRAMUSCULAR | Status: DC
  Administered 2023-10-26: 2 [IU] via SUBCUTANEOUS
  Administered 2023-10-27 (×4): 3 [IU] via SUBCUTANEOUS
  Administered 2023-10-28: 8 [IU] via SUBCUTANEOUS
  Administered 2023-10-28: 5 [IU] via SUBCUTANEOUS
  Administered 2023-10-28: 8 [IU] via SUBCUTANEOUS

## 2023-10-25 MED ORDER — RISPERIDONE 1 MG PO TABS
1.0000 mg | ORAL_TABLET | Freq: Every day | ORAL | Status: DC
Start: 2023-10-25 — End: 2023-10-30
  Administered 2023-10-25 – 2023-10-29 (×5): 1 mg
  Filled 2023-10-25 (×5): qty 1

## 2023-10-25 MED ORDER — DEXTROSE 5 % IV SOLN: INTRAVENOUS | Status: AC

## 2023-10-25 MED ORDER — DOXAZOSIN MESYLATE 2 MG PO TABS
1.0000 mg | ORAL_TABLET | Freq: Every day | ORAL | Status: DC
  Administered 2023-10-25 – 2023-10-29 (×5): 1 mg
  Filled 2023-10-25 (×5): qty 1

## 2023-10-25 MED ORDER — MONTELUKAST SODIUM 10 MG PO TABS
10.0000 mg | ORAL_TABLET | Freq: Every day | ORAL | Status: DC
Start: 2023-10-25 — End: 2023-10-30
  Administered 2023-10-25 – 2023-10-29 (×5): 10 mg
  Filled 2023-10-25 (×5): qty 1

## 2023-10-25 MED ORDER — ORAL CARE MOUTH RINSE: 15.0000 mL | OROMUCOSAL | Status: DC | PRN

## 2023-10-25 MED ORDER — BUSPIRONE HCL 15 MG PO TABS
7.5000 mg | ORAL_TABLET | Freq: Three times a day (TID) | ORAL | Status: DC
  Administered 2023-10-25 – 2023-10-29 (×14): 7.5 mg
  Filled 2023-10-25 (×5): qty 2
  Filled 2023-10-25 (×2): qty 1
  Filled 2023-10-25 (×6): qty 2
  Filled 2023-10-25: qty 1
  Filled 2023-10-25: qty 2

## 2023-10-25 MED ORDER — SUCRALFATE 1 GM/10ML PO SUSP
1.0000 g | Freq: Three times a day (TID) | ORAL | Status: DC
  Administered 2023-10-25 – 2023-10-30 (×18): 1 g
  Filled 2023-10-25 (×19): qty 10

## 2023-10-25 NOTE — Procedures (Signed)
Extubation Procedure Note  Patient Details:   Name: Matthew Molina DOB: 1951/01/01 MRN: [id number]   Airway Documentation:  Airway 7.5 mm (Active)  Secured at (cm) 21 cm 10/25/23 0312  Measured From Lips 10/25/23 0312  Secured Location Center 10/25/23 0312  Secured By [employer] 10/25/23 0312  Tube Holder Repositioned Yes 10/25/23 0312  Prone position No 10/24/23 2300  Head position Left 10/24/23 2300  Cuff Pressure (cm H2O) Clear OR 27-39 CmH2O 10/24/23 1627  Site Condition Cool;Dry 10/24/23 1627   Vent end date: (not recorded) Vent end time: (not recorded)   Evaluation  O2 sats: stable throughout Complications: No apparent complications Patient did tolerate procedure well. Bilateral Breath Sounds: Clear   Yes  Patient extubated without any complications. Patient has  an adequate cough and is able to speak clearly. Placed patient on 3L nasal cannula. Tolerating well  Sharene Skeans 10/25/2023, 7:52 AM

## 2023-10-25 NOTE — Progress Notes (Addendum)
PHARMACY - ANTICOAGULATION Pharmacy Consult for heparin Indication:  VTE treatment   Allergies  Allergen Reactions   Asa [Aspirin] Other (See Comments)    Stomach pain   Crestor [Rosuvastatin] Other (See Comments)    Stomach pain   Morphine Nausea And Vomiting   Wellbutrin [Bupropion] Other (See Comments)    Stomach pain   Zoloft [Sertraline Hcl] Other (See Comments)    Stomach pain    Patient Measurements: Height: 6' (182.9 cm) Weight: 88 kg (194 lb 0.1 oz) IBW/kg (Calculated) : 77.6 Heparin Dosing Weight: 88 kg  Vital Signs: Temp: 98.6 F (37 C) (11/07 1139) Temp Source: Axillary (11/07 1139) BP: 159/91 (11/07 1100) Pulse Rate: 86 (11/07 1100)  Labs: Recent Labs    10/23/23 1000 10/24/23 0002 10/24/23 0852 10/25/23 0441 10/25/23 0506 10/25/23 1355  HGB 11.1* 9.9*  --  10.7*  --   --   HCT 35.2* 31.2*  --  35.3*  --   --   PLT 332 279  --  257  --   --   HEPARINUNFRC  --  0.26*  --   --  0.48 0.63  CREATININE 1.53*  --  1.24 1.26*  --   --     Estimated Creatinine Clearance: 57.3 mL/min (A) (by C-G formula based on SCr of 1.26 mg/dL (H)).  Assessment: 72 y.o. male with possible PE for heparin.  Ultrasound negative for portal vein thrombosis.   HL 0.63- therapeutic  Hgb 10.7  Goal of Therapy:  Heparin level 0.3-0.7 units/ml Monitor platelets by anticoagulation protocol: Yes   Plan:  Continue heparin at 1600 units/hr. Heparin to be stopped at 0400 for procedure. Heparin level daily Continue to monitor CBC   Judeth Cornfield, PharmD Clinical Pharmacist 10/25/2023 2:48 PM

## 2023-10-25 NOTE — Plan of Care (Signed)

## 2023-10-25 NOTE — Progress Notes (Addendum)
PHARMACY - TOTAL PARENTERAL NUTRITION CONSULT NOTE   Indication:  intolerance to enteral feeding  Patient Measurements: Height: 6' (182.9 cm) Weight: 88 kg (194 lb 0.1 oz) IBW/kg (Calculated) : 77.6 TPN AdjBW (KG): 88 Body mass index is 26.31 kg/m. Usual Weight:   Assessment: Patient admitted for gastric outlet obstruction. EGD completed on 11/6 with plans for gastrojejunostomy 11/8. Patient likely to need TPN until Monday or Tuesday.  Glucose / Insulin: 78-122 Electrolytes: WNL Renal: Scr 1.26 Hepatic: Albumin 3.4 GI Imaging/procedures: EGD 11/6   Central access: PICC ordered- pending TPN start date: 11/8  Nutritional Goals: Pending   RD Assessment: Pending   Current Nutrition:  NPO  Plan:  Due to pending PICC and past TPN cutoff time will plan for TPN to start 11/8.  Will start dextrose fluids @ 40 mL/hr until then.  Judeth Cornfield, PharmD Clinical Pharmacist 10/25/2023 3:11 PM

## 2023-10-25 NOTE — Consult Note (Addendum)
[facility] Consult  Reason for Consult: gastric outlet obstruction, GI recommending gastrojejunostomy Referring Physician: Dr. Laural Benes   HPI: Matthew Molina is a 72 y.o. male who was admitted with a gastric outlet obstruction.  He has been having issues with duodenitis since at least May 2024.  He has been followed by GI for this duodenitis.  He recently began to have increasing gastric distention with concern for gastric outlet obstruction over the last few weeks.  He was admitted in October for aspiration pneumonia thought to be secondary to gastric outlet obstruction.  At that time, he underwent an EGD which showed superficial gastric ulcers in the body, no true gastric outlet obstruction, and significant duodenitis of the second portion of the duodenum, but visualization was limited secondary to food.  After he was discharged from the hospital, he continued to have abdominal pain, nausea, and vomiting.  He continued to use BC powders.  He was readmitted to the hospital on 11/5 for concern for gastric outlet obstruction.  He underwent an EGD yesterday, which demonstrated a high-grade duodenal obstruction with gastric dilation, 10 cm segment of proximal second portion of duodenum was edematous and inflamed without visible ulcer.  General surgery was consulted to evaluate patient for gastrojejunostomy.  His past medical history is significant for COPD, diabetes, hyperlipidemia, hypertension, ischemic colitis, and depression.  He denies any history of abdominal surgeries.  He was noted to have concern for portal vein thrombus on initial admission imaging, and was started on a heparin drip.  Past Medical History:  Diagnosis Date   Acute blood loss anemia 01/05/2020   Acute respiratory failure with hypoxia (HCC)    Arthritis    COPD (chronic obstructive pulmonary disease) (HCC)    Depression    Diabetes mellitus    Diabetes mellitus without complication (HCC)    Diverticulitis     Elevated PSA 10/01/2016   Encounter for support and coordination of transition of care 01/14/2020   Hypercholesterolemia    Hyperlipidemia    Hypertension    Insomnia 01/05/2016   Ischemic colitis (HCC) 01/05/2020   Multiple lung nodules on CT 04/02/2015   Nicotine addiction    Obesity    Oxygen deficiency    qhs   Schizophrenia [facility])     Past Surgical History:  Procedure Laterality Date   BIOPSY  05/28/2023   Procedure: BIOPSY;  Surgeon: Lanelle Bal, DO;  [facility];  Service: Endoscopy;;   BIOPSY  10/02/2023   Procedure: BIOPSY;  Surgeon: Dolores Frame, MD;  [facility];  Service: Gastroenterology;;   CATARACT EXTRACTION W/PHACO Left 11/20/2013   Procedure: CATARACT EXTRACTION PHACO AND INTRAOCULAR LENS PLACEMENT (IOC);  Surgeon: Gemma Payor, MD;  Location: [facility];  Service: Ophthalmology;  Laterality: Left;  CDE:10.26   CATARACT EXTRACTION W/PHACO Right 12/08/2013   Procedure: RIGHT EYE CATARACT EXTRACTION PHACO AND INTRAOCULAR LENS PLACEMENT ;  Surgeon: Gemma Payor, MD;  Location: [facility];  Service: Ophthalmology;  Laterality: Right;  CDE 12.38   COLONOSCOPY N/A 04/01/2013   pancolonic diverticulosis, redundant colon, large internal hemorrhoids.    COLONOSCOPY  2014   INCOMPLETE PREP IN R COLON   COLONOSCOPY N/A 04/01/2020   Procedure: COLONOSCOPY;  Surgeon: West Bali, MD;  [facility];  Service: Endoscopy;  Laterality: N/A;  8:45am   ESOPHAGOGASTRODUODENOSCOPY N/A 02/18/2016   stricture at GE junction, moderate erosive gastritis and mild non-erosive duodenitis. +H.pylori. Treated initially with Prevpac. Breath test to check for  eradication was positive, and he was prescribed Pylera.    ESOPHAGOGASTRODUODENOSCOPY (EGD) WITH PROPOFOL N/A 05/28/2023   Procedure: ESOPHAGOGASTRODUODENOSCOPY (EGD) WITH PROPOFOL;  Surgeon: Lanelle Bal, DO;  [facility];  Service: Endoscopy;  Laterality: N/A;  11:15am, asa 3    ESOPHAGOGASTRODUODENOSCOPY (EGD) WITH PROPOFOL N/A 10/02/2023   Procedure: ESOPHAGOGASTRODUODENOSCOPY (EGD) WITH PROPOFOL;  Surgeon: Dolores Frame, MD;  [facility];  Service: Gastroenterology;  Laterality: N/A;   EYE SURGERY Left 11/2013   cataract extraction   POLYPECTOMY  04/01/2020   Procedure: POLYPECTOMY;  Surgeon: West Bali, MD;  [facility];  Service: Endoscopy;;   SAVORY DILATION N/A 02/18/2016   Procedure: SAVORY DILATION;  Surgeon: West Bali, MD;  [facility];  Service: Endoscopy;  Laterality: N/A;   SHOULDER SURGERY Right     Family History  Problem Relation Age of Onset   Diabetes Mother    Hypertension Mother    Stroke Mother    Diabetes Sister    Heart disease Sister    Kidney disease Father    Drug abuse Brother    Colon cancer Neg Hx    Colon polyps Neg Hx     Social History   Tobacco Use   Smoking status: Every Day    Current packs/day: 1.00    Average packs/day: 1 pack/day for 48.0 years (48.0 ttl pk-yrs)    Types: Cigarettes   Smokeless tobacco: Never  Vaping Use   Vaping status: Never Used  Substance Use Topics   Alcohol use: Yes    Alcohol/week: 0.0 standard drinks of alcohol    Comment: "little bit of beer"   Drug use: No    Medications: I have reviewed the patient's current medications.  Allergies  Allergen Reactions   Asa [Aspirin] Other (See Comments)    Stomach pain   Crestor [Rosuvastatin] Other (See Comments)    Stomach pain   Morphine Nausea And Vomiting   Wellbutrin [Bupropion] Other (See Comments)    Stomach pain   Zoloft [Sertraline Hcl] Other (See Comments)    Stomach pain     ROS:  Pertinent items are noted in HPI.  Blood pressure (!) 159/91, pulse 86, temperature 98.6 F (37 C), temperature source Axillary, resp. rate 17, height 6' (1.829 m), weight 88 kg, SpO2 99%. Physical Exam Vitals reviewed.  Constitutional:      Appearance: Normal appearance.  HENT:      Head: Normocephalic and atraumatic.     Nose:     Comments: NG tube in place with milky brown output in canister Eyes:     Extraocular Movements: Extraocular movements intact.     Pupils: Pupils are equal, round, and reactive to light.  Cardiovascular:     Rate and Rhythm: Normal rate.  Pulmonary:     Effort: Pulmonary effort is normal.  Abdominal:     Comments: Abdomen soft, moderate distention, no percussion tenderness, nontender to palpation; no rigidity, guarding, rebound tenderness  Musculoskeletal:        General: Normal range of motion.     Cervical back: Normal range of motion.  Skin:    General: Skin is warm and dry.  Neurological:     General: No focal deficit present.     Mental Status: He is alert and oriented to person, place, and time.  Psychiatric:        Mood and Affect: Mood normal.        Behavior: Behavior normal.  Results: Results for orders placed or performed during the hospital encounter of 10/23/23 (from the past 48 hour(s))  Kappa/lambda light chains     Status: Abnormal   Collection Time: 10/23/23  3:42 PM  Result Value Ref Range   Kappa free light chain 24.3 (H) 3.3 - 19.4 mg/L   Lambda free light chains 23.5 5.7 - 26.3 mg/L   Kappa, lambda light chain ratio 1.03 0.26 - 1.65    Comment: (NOTE) Performed At: [facility] [address] [place], [place] [id number] Jolene Schimke MD QM:[id number]   Lactate dehydrogenase     Status: None   Collection Time: 10/23/23  3:42 PM  Result Value Ref Range   LDH 122 98 - 192 U/L    Comment: Performed at [facility], [address], [place], [place] [address]  Heparin level (unfractionated)     Status: Abnormal   Collection Time: 10/24/23 12:02 AM  Result Value Ref Range   Heparin Unfractionated 0.26 (L) 0.30 - 0.70 IU/mL    Comment: (NOTE) The clinical reportable range upper limit is being lowered to >1.10 to align with the FDA approved guidance for the current laboratory assay.  If  heparin results are below expected values, and patient dosage has  been confirmed, suggest follow up testing of antithrombin III levels. Performed at [facility], [address], [place], [place] [address]   CBC     Status: Abnormal   Collection Time: 10/24/23 12:02 AM  Result Value Ref Range   WBC 5.9 4.0 - 10.5 K/uL   RBC 3.49 (L) 4.22 - 5.81 MIL/uL   Hemoglobin 9.9 (L) 13.0 - 17.0 g/dL   HCT 02.7 (L) 25.3 - 66.4 %   MCV 89.4 80.0 - 100.0 fL   MCH 28.4 26.0 - 34.0 pg   MCHC 31.7 30.0 - 36.0 g/dL   RDW 40.3 (H) 47.4 - 25.9 %   Platelets 279 150 - 400 K/uL   nRBC 0.0 0.0 - 0.2 %    Comment: Performed at [facility], [address], [place], [place] [address]  Basic metabolic panel     Status: Abnormal   Collection Time: 10/24/23  8:52 AM  Result Value Ref Range   Sodium 134 (L) 135 - 145 mmol/L   Potassium 2.8 (L) 3.5 - 5.1 mmol/L   Chloride 96 (L) 98 - 111 mmol/L   CO2 26 22 - 32 mmol/L   Glucose, Bld 80 70 - 99 mg/dL    Comment: Glucose reference range applies only to samples taken after fasting for at least 8 hours.   BUN 18 8 - 23 mg/dL   Creatinine, Ser 5.64 0.61 - 1.24 mg/dL   Calcium 9.3 8.9 - 33.2 mg/dL   GFR, Estimated >95 >18 mL/min    Comment: (NOTE) Calculated using the CKD-EPI Creatinine Equation (2021)    Anion gap 12 5 - 15    Comment: Performed at [facility], [address], [place], [place] [address]  Magnesium     Status: None   Collection Time: 10/24/23  8:52 AM  Result Value Ref Range   Magnesium 1.7 1.7 - 2.4 mg/dL    Comment: Performed at [facility], [address] [place], [place] [address]  Glucose, capillary     Status: None   Collection Time: 10/24/23  1:21 PM  Result Value Ref Range   Glucose-Capillary 93 70 - 99 mg/dL    Comment: Glucose reference range applies only to samples taken after fasting for at  least 8 hours.  Glucose, capillary     Status: None   Collection Time: 10/24/23  2:26 PM  Result Value Ref Range   Glucose-Capillary 78  70 - 99 mg/dL    Comment: Glucose reference range applies only to samples taken after fasting for at least 8 hours.  MRSA Next Gen by PCR, Nasal     Status: None   Collection Time: 10/24/23  4:00 PM   Specimen: Nasal Mucosa; Nasal Swab  Result Value Ref Range   MRSA by PCR Next Gen NOT DETECTED NOT DETECTED    Comment: (NOTE) The GeneXpert MRSA Assay (FDA approved for NASAL specimens only), is one component of a comprehensive MRSA colonization surveillance program. It is not intended to diagnose MRSA infection nor to guide or monitor treatment for MRSA infections. Test performance is not FDA approved in patients less than 53 years old. Performed at [facility], [address], [place], [place] [address]   Glucose, capillary     Status: Abnormal   Collection Time: 10/24/23  4:02 PM  Result Value Ref Range   Glucose-Capillary 125 (H) 70 - 99 mg/dL    Comment: Glucose reference range applies only to samples taken after fasting for at least 8 hours.  Glucose, capillary     Status: Abnormal   Collection Time: 10/24/23  9:39 PM  Result Value Ref Range   Glucose-Capillary 122 (H) 70 - 99 mg/dL    Comment: Glucose reference range applies only to samples taken after fasting for at least 8 hours.  Glucose, capillary     Status: Abnormal   Collection Time: 10/25/23  1:04 AM  Result Value Ref Range   Glucose-Capillary 129 (H) 70 - 99 mg/dL    Comment: Glucose reference range applies only to samples taken after fasting for at least 8 hours.  CBC     Status: Abnormal   Collection Time: 10/25/23  4:41 AM  Result Value Ref Range   WBC 5.4 4.0 - 10.5 K/uL   RBC 3.78 (L) 4.22 - 5.81 MIL/uL   Hemoglobin 10.7 (L) 13.0 - 17.0 g/dL   HCT 60.4 (L) 54.0 - 98.1 %   MCV 93.4 80.0 - 100.0 fL   MCH 28.3 26.0 - 34.0 pg   MCHC 30.3 30.0 - 36.0 g/dL   RDW 19.1 (H) 47.8 - 29.5 %   Platelets 257 150 - 400 K/uL   nRBC 0.0 0.0 - 0.2 %    Comment: Performed at [facility], [address], [place], [place]  [address]  Comprehensive metabolic panel     Status: Abnormal   Collection Time: 10/25/23  4:41 AM  Result Value Ref Range   Sodium 136 135 - 145 mmol/L   Potassium 3.5 3.5 - 5.1 mmol/L   Chloride 101 98 - 111 mmol/L   CO2 19 (L) 22 - 32 mmol/L   Glucose, Bld 109 (H) 70 - 99 mg/dL    Comment: Glucose reference range applies only to samples taken after fasting for at least 8 hours.   BUN 20 8 - 23 mg/dL   Creatinine, Ser 8.65 (H) 0.61 - 1.24 mg/dL   Calcium 9.6 8.9 - 78.4 mg/dL   Total Protein 6.5 6.5 - 8.1 g/dL   Albumin 3.4 (L) 3.5 - 5.0 g/dL   AST 10 (L) 15 - 41 U/L   ALT 15 0 - 44 U/L   Alkaline Phosphatase 30 (L) 38 - 126 U/L   Total Bilirubin 1.0 <1.2 mg/dL  GFR, Estimated >60 >60 mL/min    Comment: (NOTE) Calculated using the CKD-EPI Creatinine Equation (2021)    Anion gap 16 (H) 5 - 15    Comment: Performed at [facility], [address] [place], [place] [address]  Magnesium     Status: None   Collection Time: 10/25/23  4:41 AM  Result Value Ref Range   Magnesium 1.9 1.7 - 2.4 mg/dL    Comment: Performed at [facility], [address], [place], [place] [address]  Heparin level (unfractionated)     Status: None   Collection Time: 10/25/23  5:06 AM  Result Value Ref Range   Heparin Unfractionated 0.48 0.30 - 0.70 IU/mL    Comment: (NOTE) The clinical reportable range upper limit is being lowered to >1.10 to align with the FDA approved guidance for the current laboratory assay.  If heparin results are below expected values, and patient dosage has  been confirmed, suggest follow up testing of antithrombin III levels. Performed at [facility], [address], [place], [place] [address]   Glucose, capillary     Status: Abnormal   Collection Time: 10/25/23  6:06 AM  Result Value Ref Range   Glucose-Capillary 112 (H) 70 - 99 mg/dL    Comment: Glucose reference range applies only to samples taken after fasting for at least 8 hours.   *Note: Due to a large number of  results and/or encounters for the requested time period, some results have not been displayed. A complete set of results can be found in Results Review.    DG Abd Portable 1V  Result Date: 10/25/2023 CLINICAL DATA:  NG tube placement EXAM: PORTABLE ABDOMEN - 1 VIEW COMPARISON:  KUB 2 days prior, CT abdomen/pelvis 1 day prior FINDINGS: The enteric catheter tip is in the stomach and sidehole is in the distal esophagus. There is a nonobstructive bowel gas pattern. There is no definite free intraperitoneal air. There is no abnormal soft tissue calcification. Contrast is noted in the bladder. There is no acute osseous abnormality. IMPRESSION: Enteric catheter sidehole in the distal esophagus. Recommend advancement by approximately 8 cm. These results will be called to the ordering clinician or representative by the Radiologist Assistant, and communication documented in the PACS or [employer]. Electronically Signed   By: Lesia Hausen M.D.   On: 10/25/2023 13:34   DG CHEST PORT 1 VIEW  Result Date: 10/24/2023 CLINICAL DATA:  Intubation. EXAM: PORTABLE CHEST 1 VIEW COMPARISON:  10/23/2023. FINDINGS: The heart size and mediastinal contours are stable. There is atherosclerotic calcification of the aorta. Mild airspace disease is noted at the left lung base. No effusion or pneumothorax is seen. An endotracheal tube terminates 4.4 cm above the carina. The side port of an enteric tube is above the level of the diaphragm and should be advanced 14 cm. IMPRESSION: 1. Mild airspace disease at the left lung base, likely atelectasis. 2. Endotracheal tube terminates 4.4 cm above the carina. 3. The enteric tube terminates above the level of the diaphragm and should be advanced 14 cm. Electronically Signed   By: Thornell Sartorius M.D.   On: 10/24/2023 20:14   CT Angio Chest/Abd/Pel for Dissection W and/or W/WO  Result Date: 10/24/2023 CLINICAL DATA:  Arterial embolism suspected, non-extremity, determine source rule out  chest PE, rule out GI ischemia per Dr. Jena Gauss EXAM: CT ANGIOGRAPHY CHEST, ABDOMEN AND PELVIS TECHNIQUE: Non-contrast CT of the chest was initially obtained. Multidetector CT imaging through the chest, abdomen and pelvis was performed using  the standard protocol during bolus administration of intravenous contrast. Multiplanar reconstructed images and MIPs were obtained and reviewed to evaluate the vascular anatomy. RADIATION DOSE REDUCTION: This exam was performed according to the departmental dose-optimization program which includes automated exposure control, adjustment of the mA and/or kV according to patient size and/or use of iterative reconstruction technique. CONTRAST:  75mL OMNIPAQUE IOHEXOL 350 MG/ML SOLN COMPARISON:  10/23/2023, 09/30/2023 FINDINGS: CTA CHEST FINDINGS Cardiovascular: Heart is borderline in size. Scattered coronary artery and aortic calcifications. 4 cm ascending thoracic aortic aneurysm. No dissection. Pulmonary arteries are not adequately opacified to evaluate for pulmonary emboli on this aortic/dissection study. Mediastinum/Nodes: No mediastinal, hilar, or axillary adenopathy. Endotracheal tube in the mid to lower trachea. Thyroid and esophagus unremarkable. Lungs/Pleura: Small left pleural effusion, slightly increased since prior study. Increasing left basilar atelectasis or infiltrate. Musculoskeletal: Chest wall soft tissues are unremarkable. No acute bony abnormality. Review of the MIP images confirms the above findings. CTA ABDOMEN AND PELVIS FINDINGS VASCULAR Aorta: Aortic atherosclerosis.  No aneurysm or dissection. Celiac: Patent without evidence of aneurysm, dissection, vasculitis or significant stenosis. SMA: Patent without evidence of aneurysm, dissection, vasculitis or significant stenosis. Renals: Single left renal artery and 2 right renal arteries. No evidence of stenosis. Scattered calcifications. IMA: Patent Inflow: Moderate calcifications through the iliofemoral vessels.  No aneurysm, dissection or significant stenosis. Veins: No obvious venous abnormality within the limitations of this arterial phase study. Review of the MIP images confirms the above findings. NON-VASCULAR Hepatobiliary: No focal hepatic abnormality. Gallbladder unremarkable. Pancreas: No focal abnormality or ductal dilatation. Spleen: No focal abnormality.  Normal size. Adrenals/Urinary Tract: No adrenal nodules. Bilateral renal cysts appear benign with no additional follow-up recommended. No stones or hydronephrosis. Small layering mobile stones in the left side of the urinary bladder. Stomach/Bowel: Marked distention of the stomach again noted. Duodenal bulb is distended with fluid as well. There appears to be circumferential wall thickening within the descending duodenum with surrounding inflammation/stranding. Overall, this appears slightly worsened since prior study. Remainder of small bowel decompressed. Colonic diverticulosis. No active diverticulitis. Lymphatic: No adenopathy Reproductive: Prostate enlargement. Other: No free fluid or free air. Musculoskeletal: No acute bony abnormality. Review of the MIP images confirms the above findings. IMPRESSION: No evidence of aortic aneurysm or dissection. Aortic atherosclerosis. Mesenteric vessels appear widely patent. Unable to assess/evaluate for pulmonary emboli given the bolus timing to adequately opacify and evaluate the aorta. Coronary artery disease. Small left pleural effusion with left basilar atelectasis or infiltrate, increasing since prior study. Marked distention of the stomach with fluid and air. This appears to be due to wall thickening and surrounding inflammation in the region of the descending duodenum. This could be related to severe duodenitis or possibly duodenal wall hematoma. Findings concerning for gastric outlet obstruction. The patient may benefit from NG tube decompression. Electronically Signed   By: Charlett Nose M.D.   On: 10/24/2023  19:22   DG Abdomen 1 View  Result Date: 10/23/2023 CLINICAL DATA:  Nasogastric tube placement EXAM: ABDOMEN - 1 VIEW COMPARISON:  None Available. FINDINGS: The nasogastric tube is looped within the distal esophagus with its tip within the distal esophagus overlying T7. The abdominal gas pattern is nonspecific due to a paucity of intra-abdominal gas. Contrast is seen within a nondilated renal collecting system bilaterally. IMPRESSION: 1. Nasogastric tube looped within the distal esophagus with its tip within the distal esophagus overlying T7. Repositioning is recommended. Electronically Signed   By: Helyn Numbers M.D.   On: 10/23/2023 18:16  US ABDOMEN LIMITED WITH LIVER DOPPLER  Result Date: 10/23/2023 CLINICAL DATA:  Portal vein thrombus EXAM: DUPLEX ULTRASOUND OF LIVER TECHNIQUE: Color and duplex Doppler ultrasound was performed to evaluate the hepatic in-flow and out-flow vessels. COMPARISON:  CT 10/23/2023 FINDINGS: Liver: Normal parenchymal echogenicity. Normal hepatic contour without nodularity. No focal lesion, mass or intrahepatic biliary ductal dilatation. Limited images of the gallbladder are unremarkable. Main Portal Vein size: 20 mm cm Portal Vein Velocities Main Prox:  69 cm/sec Main Mid: 48 cm/sec Main Dist:  40 cm/sec Right: 29 cm/sec Left: 29 cm/sec Flow within the main, right, and left portal veins is hepatopetal (antegrade) Hepatic Vein Velocities Right:  81 cm/sec Middle:  33 cm/sec Left:  36 cm/sec Flow within hepatic veins demonstrates appropriate directional flow (hepatofugal) IVC: Present and patent with normal respiratory phasicity. Hepatic Artery Velocity:  95 cm/sec Splenic Vein Velocity:  39 cm/sec Spleen: 10.3 cm x 9.6 cm x 3.7 cm with a total volume of 191 cm^3 (411 cm^3 is upper limit normal) Portal Vein Occlusion/Thrombus: Not identified. Focal thrombus noted on prior CT examination is not visualized on the current exam. Splenic Vein Occlusion/Thrombus: No Ascites: None  Varices: None IMPRESSION: 1. Normal hepatic Doppler examination. No evidence of portal vein thrombosis. Electronically Signed   By: Helyn Numbers M.D.   On: 10/23/2023 18:14     Assessment & Plan:  ZYIERE ROSEMOND is a 72 y.o. male who was admitted with a gastric outlet obstruction.  Imaging and blood work evaluated by myself.  -I had a long discussion with the patient and his daughter regarding his current disease process.  We discussed that the gastric outlet obstruction is preventing him from passing any food or drink and resulting with his abdominal distention which is causing pain, nausea, and vomiting.  Since this area of his duodenum has been inflamed for such a significant period of time and has caused secondary complications with his recent episode of aspiration pneumonia, I recommend creation of a gastrojejunostomy to bypass this gastric outlet obstruction -We discussed that he has high risk for poor wound healing given his poor nutritional status.  We also discussed that there is the potential that he will remain intubated for a night after surgery for airway protection. -The risk and benefits of gastrojejunostomy were discussed including but not limited to bleeding, infection, injury to surrounding structures, need for additional procedures, leak, and poor wound healing.  After careful consideration, Matthew Molina and his daughter have decided to to proceed with surgery.  -Patient tentatively scheduled for surgery tomorrow.  Will order prophylactic antibiotics to be administered preoperatively -Recommend holding heparin drip at 4 AM for at least 6 hours before surgery -Maintain NG tube to LIS, NPO -Continue PPI -PRN pain control and antiemetics -Will discuss possible initiation of TPN with hospitalist -Appreciate GI and hospitalist recommendations  All questions were answered to the satisfaction of the patient and family.  Greater than 80 minutes were spent evaluating the patient's  chart, evaluating the patient, and discussing treatment options and surgical planning with patient and his daughter.  -- Theophilus Kinds, DO [facility] [address] [place], [place] [address] [phone] (office)

## 2023-10-25 NOTE — Progress Notes (Addendum)
Per the reading from the xray on patient last night on 11/6 @ 8pm "enteric tube terminates above the level of the diaphragm and should be advanced 14 cm". Upon assessment, it does not seem that the NG tube had been advanced. NG tube now has been advanced from 47cm marking at nare to now at 68cm marking at nare. Pending xray to confirm placement.   Addendum: Per [facility] call, recommendation is to now advance NG tube 8cm more. NG tube advanced. Xray ordered and pending. Marking now at 80cm at R Nare.

## 2023-10-25 NOTE — TOC Initial Note (Signed)
Transition of Care [facility]) - Initial/Assessment Note    Patient Details  Name: Matthew Molina MRN: [id number] Date of Birth: [birth year]  Transition of [facility]) CM/SW Contact:    Villa Herb, LCSWA Phone Number: 10/25/2023, 10:01 AM  Clinical Narrative:                 Pt recently admitted. Pt is high risk for readmission. Pt lives alone but has assistance by friends and family when needed. Pt has transportation provided as needed. TOC to follow.   Expected Discharge Plan: Home/Self Care Barriers to Discharge: Continued Medical Work up   Patient Goals and CMS Choice Patient states their goals for this hospitalization and ongoing recovery are:: return home CMS Medicare.gov Compare Post Acute Care list provided to:: Patient Choice offered to / list presented to : Patient      Expected Discharge Plan and Services In-house Referral: Clinical Social Work Discharge Planning Services: CM Consult   Living arrangements for the past 2 months: Single Family Home                                      Prior Living Arrangements/Services Living arrangements for the past 2 months: Single Family Home Lives with:: Self Patient language and need for interpreter reviewed:: Yes Do you feel safe going back to the place where you live?: Yes      Need for Family Participation in Patient Care: Yes (Comment) Care giver support system in place?: Yes (comment) Current home services: DME Criminal Activity/Legal Involvement Pertinent to Current Situation/Hospitalization: No - Comment as needed  Activities of Daily Living   ADL Screening (condition at time of admission) Independently performs ADLs?: Yes (appropriate for developmental age) Is the patient deaf or have difficulty hearing?: No Does the patient have difficulty seeing, even when wearing glasses/contacts?: No Does the patient have difficulty concentrating, remembering, or making decisions?: No  Permission Sought/Granted                   Emotional Assessment Appearance:: Appears stated age Attitude/Demeanor/Rapport: Engaged Affect (typically observed): Accepting   Alcohol / Substance Use: Not Applicable Psych Involvement: No (comment)  Admission diagnosis:  Portal vein thrombosis [I81] Gastric outlet obstruction [K31.1] Patient Active Problem List   Diagnosis Date Noted   Portal vein thrombosis 10/23/2023   Chronic kidney disease, stage 3a (HCC) 10/23/2023   Gastric outlet obstruction 10/23/2023   Small bowel obstruction (HCC) 10/23/2023   Hypoglycemia 10/14/2023   Iron deficiency anemia 10/02/2023   Nausea and vomiting 10/01/2023   Peptic ulcer disease 09/30/2023   RML pneumonia 09/30/2023   Duodenitis 09/18/2023   Type 2 diabetes mellitus with hyperlipidemia (HCC) 08/09/2023   Chronic pancreatitis (HCC) 06/30/2023   Weight loss, non-intentional 06/30/2023   Acute pancreatitis 04/30/2023   Encounter for support and coordination of transition of care 12/17/2022   COPD exacerbation (HCC) 12/08/2022   Influenza A 12/08/2022   Encounter for Medicare annual examination with abnormal findings 09/19/2022   Hip pain, right 09/19/2022   Shoulder pain, left 08/09/2022   Arm pain 01/27/2022   Multiple environmental allergies 01/02/2022   Pes planus 08/03/2021   Abnormal LFTs 06/28/2021   Benign prostatic hyperplasia with urinary obstruction 10/11/2020   Emphysema lung (HCC) 09/19/2020   Tubular adenoma of colon 09/13/2020   Incomplete emptying of bladder 09/08/2020   Musculoskeletal leg pain, right 08/11/2020   Hip pain,  bilateral 08/11/2020   Hemorrhoids 03/18/2020   Muscle pain 01/22/2020   QT prolongation 01/14/2020   Chronic respiratory failure with hypoxia (HCC) 01/06/2020   Diverticulosis of colon    Urinary incontinence 10/08/2019   Unsteady gait 04/18/2017   Elevated PSA 10/01/2016   GERD (gastroesophageal reflux disease) 08/24/2016   Chronic kidney disease, stage 3b (HCC)  05/22/2016   COPD (chronic obstructive pulmonary disease) (HCC) 11/05/2015   Hypertension 11/05/2015   Tobacco abuse 07/24/2015   Coronary atherosclerosis of native coronary artery 04/02/2015   Multiple lung nodules on CT 04/02/2015   GAD (generalized anxiety disorder) 07/04/2012   Back pain with radiation 11/01/2011   Hyperlipidemia 06/02/2008   Obesity (BMI 30.0-34.9) 06/02/2008   Schizophrenia, unspecified type (HCC) 06/02/2008   PCP:  Kerri Perches, MD Pharmacy:   [facility] [place] [place] - [address] [place] [address] Phone: [phone] Fax: [phone]     Social Determinants of Health (SDOH) Social History: SDOH Screenings   Food Insecurity: No Food Insecurity (10/23/2023)  Housing: Low Risk  (10/23/2023)  Transportation Needs: No Transportation Needs (10/23/2023)  Utilities: Not At Risk (10/23/2023)  Alcohol Screen: Low Risk  (11/27/2022)  Depression (PHQ2-9): Low Risk  (10/12/2023)  Recent Concern: Depression (PHQ2-9) - High Risk (08/02/2023)  Financial Resource Strain: Low Risk  (12/13/2022)  Physical Activity: Inactive (11/27/2022)  Social Connections: Moderately Isolated (11/27/2022)  Stress: No Stress Concern Present (11/27/2022)  Tobacco Use: High Risk (10/24/2023)   SDOH Interventions:     Readmission Risk Interventions    10/25/2023   10:00 AM 10/24/2023   11:51 AM 10/01/2023    2:57 PM  Readmission Risk Prevention Plan  Transportation Screening Complete Complete Complete  Home Care Screening  Complete   Medication Review (RN CM)  Complete   HRI or Home Care Consult Complete  Complete  Social Work Consult for Recovery Care Planning/Counseling Complete  Complete  Palliative Care Screening Not Applicable  Not Applicable  Medication Review Oceanographer) Complete  Complete

## 2023-10-25 NOTE — Progress Notes (Signed)
PHARMACY - ANTICOAGULATION Pharmacy Consult for heparin Indication:  VTE treatment Brief A/P: Heparin level subtherapeutic Increase Heparin rate  Allergies  Allergen Reactions   Asa [Aspirin] Other (See Comments)    Stomach pain   Crestor [Rosuvastatin] Other (See Comments)    Stomach pain   Morphine Nausea And Vomiting   Wellbutrin [Bupropion] Other (See Comments)    Stomach pain   Zoloft [Sertraline Hcl] Other (See Comments)    Stomach pain    Patient Measurements: Height: 6' (182.9 cm) Weight: 88 kg (194 lb 0.1 oz) IBW/kg (Calculated) : 77.6 Heparin Dosing Weight: 88 kg  Vital Signs: Temp: 99 F (37.2 C) (11/06 2129) BP: 141/94 (11/07 0500) Pulse Rate: 66 (11/07 0500)  Labs: Recent Labs    10/23/23 1000 10/24/23 0002 10/24/23 0852 10/25/23 0441 10/25/23 0506  HGB 11.1* 9.9*  --  10.7*  --   HCT 35.2* 31.2*  --  35.3*  --   PLT 332 279  --  257  --   HEPARINUNFRC  --  0.26*  --   --  0.48  CREATININE 1.53*  --  1.24 1.26*  --     Estimated Creatinine Clearance: 57.3 mL/min (A) (by C-G formula based on SCr of 1.26 mg/dL (H)).  Assessment: 72 y.o. male with possible PE for heparin.  Ultrasound negative for portal vein thrombosis.   11/7 AM: heparin level returned at 0.48 on 1600 units/hr (therapeutic). No issues with infusion or signs/symptoms of bleeding per RN.  Goal of Therapy:  Heparin level 0.3-0.7 units/ml Monitor platelets by anticoagulation protocol: Yes   Plan:  Continue heparin at 1600 units/hr Confirmatory Heparin level in 8 hours Monitor CBC daily  Arabella Merles, PharmD. Clinical Pharmacist 10/25/2023 6:39 AM

## 2023-10-25 NOTE — Progress Notes (Signed)
PROGRESS NOTE   Matthew Molina  GHW:[id number] DOB: 1951-03-29 DOA: 10/23/2023 PCP: Kerri Perches, MD   No chief complaint on file.  Level of care: ICU  Brief Admission History:  72 year old male with a history of IgG MGUS, COPD, diabetes mellitus type 2, hypertension, hyperlipidemia, depression, tobacco abuse, coronary disease presenting with generalized weakness, abdominal pain, nausea and vomiting. The patient was recently admitted to the hospital from 09/30/2023 to 10/03/2023.  At that time, the patient had nausea and vomiting and poor oral intake and upper abdominal pain as well.  The patient was treated for sepsis secondary to aspiration pneumonia.  GI was consulted during the admission.  The patient underwent EGD on 10/02/2023 which showed grade B esophagitis without bleeding, nonbleeding gastric ulcers with a clean base, and duodenitis.  The patient was discharged home with PPI twice daily and sulcrafate.  The patient states that he was feeling well after d/c and he was tolerating a soft diet. However, he but began having gradual abdominal pain and belching in the past 1 week.  He states that things have gradually worsened over the past 3 days prior to admission with increasing belching and hiccups that has resulted in vomiting every time he belches.  He has had poor oral intake and progressive generalized weakness.  He also complains of worsening mid abdominal pain.  He denies f/c, cp, sob, hemoptysis, diarrhea, hematochezia, melena, dysuria, hematuria.  As result of abd pain with n/v, the patient presented for further evaluation and treatment.  He continues to smoke a few cigarette's a day. Notably, patient states he ate spaghetti and chocolate chip cookies before he came to ED and did not have any emesis.   In the ED, the patient was afebrile hemodynamically stable with oxygen saturation 92% on room air.  WBC 6.0, hemoglobin 11.1, platelets 332.  Sodium 132, potassium 3.1, bicarbonate  28, serum creatinine 1.53.  LFTs were unremarkable.  Lipase 84.  CT of the abdomen and pelvis showed marked distention of the stomach to the level of the duodenum where there is circumferential thickening and heterogenous hypoattenuation at the site of the patient's previous duodenitis. There was a new hypodensity upstream portal vein suspicious for nonocclusive thrombus.  There is also a partially imaged hypodensity in the right upper lobe pulmonary artery which may represent artifact versus PE. The patient was started on IV heparin. -GI was consulted to assist with management.   Assessment and Plan:  Severe distal duodenal obstruction  - discussed with GI, he was kept intubated post EGD overnight, orders placed to EXTUBATE this AM   - GI recommends CTA abdomen completed to rule out ischemic process, consult general surgery for gastrojejunostomy for drainage  - NG tube in place  - continue pantoprazole  - surgery planning for gastrojejunostomy on 10/26/23   Question of portal venous thrombosis Question of PE - CTA chest/abd/pelvis unfortunately not able to evaluate for PE given timing of contrast bolus given  - liver doppler without evidence of portal vein thrombosis  - pt remains on IV heparin infusion, check a D dimer, if it is reassuring, would stop IV heparin, otherwise probably would need to continue anticoagulation or check a V/Q scan   CKD stage IIIa -Baseline creatinine 1.2-1.5 -Monitor daily BMP   COPD -Stable on room air -Restart BDs -Continue Singulair when able to tolerate p.o.   Tobacco abuse -Tobacco cessation discussed   Depression/anxiety -restart risperdal and buspar when able to tolerate po   Controlled diabetes  mellitus type 2 -10/07/2023 hemoglobin A1c 6.0   BPH -Restart tamsulosin when able to tolerate p.o   DVT prophylaxis:  SCDs Code Status: FULL  Family Communication:  Disposition: transferred to ICU    Consultants:  GI   Procedures:  EGD  10/24/23   Antimicrobials:    Subjective: Pt agitated, wanting the ETT tube removed.  Trying to pull at ETT tube at times despite mitts.  Remains on IV fentanyl infusion and midazolam IV pushes    Objective: Vitals:   10/25/23 0932 10/25/23 1000 10/25/23 1100 10/25/23 1139  BP: (!) 158/88 (!) 148/74 (!) 159/91   Pulse: 90 73 86   Resp: 18 (!) 9 17   Temp:    98.6 F (37 C)  TempSrc:    Axillary  SpO2: 97% 99% 99%   Weight:      Height:        Intake/Output Summary (Last 24 hours) at 10/25/2023 1425 Last data filed at 10/25/2023 1355 Gross per 24 hour  Intake 793.58 ml  Output 1000 ml  Net -206.42 ml   Filed Weights   10/23/23 0909 10/24/23 1611  Weight: 88 kg 88 kg   Examination:  General exam: critically ill appearing, intubated.  He is more awake now and pulling at ET tube.   Respiratory system: mechanically vented. BBS heard.  Cardiovascular system: normal S1 & S2 heard. No JVD, murmurs, rubs, gallops or clicks. No pedal edema. Gastrointestinal system: Abdomen is distended, soft. No organomegaly or masses felt. Normal bowel sounds heard. Central nervous system: sedated.  No focal neurological deficits. Extremities: Symmetric 5 x 5 power. Skin: No rashes, lesions or ulcers. Psychiatry: Judgement and insight UTD   Data Reviewed: I have personally reviewed following labs and imaging studies  CBC: Recent Labs  Lab 10/23/23 1000 10/24/23 0002 10/25/23 0441  WBC 6.0 5.9 5.4  NEUTROABS 4.3  --   --   HGB 11.1* 9.9* 10.7*  HCT 35.2* 31.2* 35.3*  MCV 89.6 89.4 93.4  PLT 332 279 257    Basic Metabolic Panel: Recent Labs  Lab 10/23/23 1000 10/24/23 0852 10/25/23 0441  NA 132* 134* 136  K 3.1* 2.8* 3.5  CL 91* 96* 101  CO2 26 26 19*  GLUCOSE 151* 80 109*  BUN 20 18 20   CREATININE 1.53* 1.24 1.26*  CALCIUM 10.3 9.3 9.6  MG  --  1.7 1.9    CBG: Recent Labs  Lab 10/24/23 1426 10/24/23 1602 10/24/23 2139 10/25/23 0104 10/25/23 0606  GLUCAP 78 125*  122* 129* 112*    Recent Results (from the past 240 hour(s))  MRSA Next Gen by PCR, Nasal     Status: None   Collection Time: 10/24/23  4:00 PM   Specimen: Nasal Mucosa; Nasal Swab  Result Value Ref Range Status   MRSA by PCR Next Gen NOT DETECTED NOT DETECTED Final    Comment: (NOTE) The GeneXpert MRSA Assay (FDA approved for NASAL specimens only), is one component of a comprehensive MRSA colonization surveillance program. It is not intended to diagnose MRSA infection nor to guide or monitor treatment for MRSA infections. Test performance is not FDA approved in patients less than 50 years old. Performed at [facility], [address] [place], [place] [address]      Radiology Studies: DG Abd Portable 1V  Result Date: 10/25/2023 CLINICAL DATA:  NG tube placement EXAM: PORTABLE ABDOMEN - 1 VIEW COMPARISON:  KUB 2 days prior, CT abdomen/pelvis 1 day prior FINDINGS: The enteric  catheter tip is in the stomach and sidehole is in the distal esophagus. There is a nonobstructive bowel gas pattern. There is no definite free intraperitoneal air. There is no abnormal soft tissue calcification. Contrast is noted in the bladder. There is no acute osseous abnormality. IMPRESSION: Enteric catheter sidehole in the distal esophagus. Recommend advancement by approximately 8 cm. These results will be called to the ordering clinician or representative by the Radiologist Assistant, and communication documented in the PACS or [employer]. Electronically Signed   By: Lesia Hausen M.D.   On: 10/25/2023 13:34   DG CHEST PORT 1 VIEW  Result Date: 10/24/2023 CLINICAL DATA:  Intubation. EXAM: PORTABLE CHEST 1 VIEW COMPARISON:  10/23/2023. FINDINGS: The heart size and mediastinal contours are stable. There is atherosclerotic calcification of the aorta. Mild airspace disease is noted at the left lung base. No effusion or pneumothorax is seen. An endotracheal tube terminates 4.4 cm above the carina. The side port of  an enteric tube is above the level of the diaphragm and should be advanced 14 cm. IMPRESSION: 1. Mild airspace disease at the left lung base, likely atelectasis. 2. Endotracheal tube terminates 4.4 cm above the carina. 3. The enteric tube terminates above the level of the diaphragm and should be advanced 14 cm. Electronically Signed   By: Thornell Sartorius M.D.   On: 10/24/2023 20:14   CT Angio Chest/Abd/Pel for Dissection W and/or W/WO  Result Date: 10/24/2023 CLINICAL DATA:  Arterial embolism suspected, non-extremity, determine source rule out chest PE, rule out GI ischemia per Dr. Jena Gauss EXAM: CT ANGIOGRAPHY CHEST, ABDOMEN AND PELVIS TECHNIQUE: Non-contrast CT of the chest was initially obtained. Multidetector CT imaging through the chest, abdomen and pelvis was performed using the standard protocol during bolus administration of intravenous contrast. Multiplanar reconstructed images and MIPs were obtained and reviewed to evaluate the vascular anatomy. RADIATION DOSE REDUCTION: This exam was performed according to the departmental dose-optimization program which includes automated exposure control, adjustment of the mA and/or kV according to patient size and/or use of iterative reconstruction technique. CONTRAST:  75mL OMNIPAQUE IOHEXOL 350 MG/ML SOLN COMPARISON:  10/23/2023, 09/30/2023 FINDINGS: CTA CHEST FINDINGS Cardiovascular: Heart is borderline in size. Scattered coronary artery and aortic calcifications. 4 cm ascending thoracic aortic aneurysm. No dissection. Pulmonary arteries are not adequately opacified to evaluate for pulmonary emboli on this aortic/dissection study. Mediastinum/Nodes: No mediastinal, hilar, or axillary adenopathy. Endotracheal tube in the mid to lower trachea. Thyroid and esophagus unremarkable. Lungs/Pleura: Small left pleural effusion, slightly increased since prior study. Increasing left basilar atelectasis or infiltrate. Musculoskeletal: Chest wall soft tissues are unremarkable. No  acute bony abnormality. Review of the MIP images confirms the above findings. CTA ABDOMEN AND PELVIS FINDINGS VASCULAR Aorta: Aortic atherosclerosis.  No aneurysm or dissection. Celiac: Patent without evidence of aneurysm, dissection, vasculitis or significant stenosis. SMA: Patent without evidence of aneurysm, dissection, vasculitis or significant stenosis. Renals: Single left renal artery and 2 right renal arteries. No evidence of stenosis. Scattered calcifications. IMA: Patent Inflow: Moderate calcifications through the iliofemoral vessels. No aneurysm, dissection or significant stenosis. Veins: No obvious venous abnormality within the limitations of this arterial phase study. Review of the MIP images confirms the above findings. NON-VASCULAR Hepatobiliary: No focal hepatic abnormality. Gallbladder unremarkable. Pancreas: No focal abnormality or ductal dilatation. Spleen: No focal abnormality.  Normal size. Adrenals/Urinary Tract: No adrenal nodules. Bilateral renal cysts appear benign with no additional follow-up recommended. No stones or hydronephrosis. Small layering mobile stones in the left side of  the urinary bladder. Stomach/Bowel: Marked distention of the stomach again noted. Duodenal bulb is distended with fluid as well. There appears to be circumferential wall thickening within the descending duodenum with surrounding inflammation/stranding. Overall, this appears slightly worsened since prior study. Remainder of small bowel decompressed. Colonic diverticulosis. No active diverticulitis. Lymphatic: No adenopathy Reproductive: Prostate enlargement. Other: No free fluid or free air. Musculoskeletal: No acute bony abnormality. Review of the MIP images confirms the above findings. IMPRESSION: No evidence of aortic aneurysm or dissection. Aortic atherosclerosis. Mesenteric vessels appear widely patent. Unable to assess/evaluate for pulmonary emboli given the bolus timing to adequately opacify and evaluate the  aorta. Coronary artery disease. Small left pleural effusion with left basilar atelectasis or infiltrate, increasing since prior study. Marked distention of the stomach with fluid and air. This appears to be due to wall thickening and surrounding inflammation in the region of the descending duodenum. This could be related to severe duodenitis or possibly duodenal wall hematoma. Findings concerning for gastric outlet obstruction. The patient may benefit from NG tube decompression. Electronically Signed   By: Charlett Nose M.D.   On: 10/24/2023 19:22   DG Abdomen 1 View  Result Date: 10/23/2023 CLINICAL DATA:  Nasogastric tube placement EXAM: ABDOMEN - 1 VIEW COMPARISON:  None Available. FINDINGS: The nasogastric tube is looped within the distal esophagus with its tip within the distal esophagus overlying T7. The abdominal gas pattern is nonspecific due to a paucity of intra-abdominal gas. Contrast is seen within a nondilated renal collecting system bilaterally. IMPRESSION: 1. Nasogastric tube looped within the distal esophagus with its tip within the distal esophagus overlying T7. Repositioning is recommended. Electronically Signed   By: Helyn Numbers M.D.   On: 10/23/2023 18:16   US ABDOMEN LIMITED WITH LIVER DOPPLER  Result Date: 10/23/2023 CLINICAL DATA:  Portal vein thrombus EXAM: DUPLEX ULTRASOUND OF LIVER TECHNIQUE: Color and duplex Doppler ultrasound was performed to evaluate the hepatic in-flow and out-flow vessels. COMPARISON:  CT 10/23/2023 FINDINGS: Liver: Normal parenchymal echogenicity. Normal hepatic contour without nodularity. No focal lesion, mass or intrahepatic biliary ductal dilatation. Limited images of the gallbladder are unremarkable. Main Portal Vein size: 20 mm cm Portal Vein Velocities Main Prox:  69 cm/sec Main Mid: 48 cm/sec Main Dist:  40 cm/sec Right: 29 cm/sec Left: 29 cm/sec Flow within the main, right, and left portal veins is hepatopetal (antegrade) Hepatic Vein Velocities  Right:  81 cm/sec Middle:  33 cm/sec Left:  36 cm/sec Flow within hepatic veins demonstrates appropriate directional flow (hepatofugal) IVC: Present and patent with normal respiratory phasicity. Hepatic Artery Velocity:  95 cm/sec Splenic Vein Velocity:  39 cm/sec Spleen: 10.3 cm x 9.6 cm x 3.7 cm with a total volume of 191 cm^3 (411 cm^3 is upper limit normal) Portal Vein Occlusion/Thrombus: Not identified. Focal thrombus noted on prior CT examination is not visualized on the current exam. Splenic Vein Occlusion/Thrombus: No Ascites: None Varices: None IMPRESSION: 1. Normal hepatic Doppler examination. No evidence of portal vein thrombosis. Electronically Signed   By: Helyn Numbers M.D.   On: 10/23/2023 18:14    Scheduled Meds:  arformoterol  15 mcg Nebulization BID   budesonide (PULMICORT) nebulizer solution  0.5 mg Nebulization BID   busPIRone  7.5 mg Per Tube TID   Chlorhexidine Gluconate Cloth  6 each Topical Daily   docusate  100 mg Per Tube BID   ipratropium-albuterol  3 mL Nebulization TID   montelukast  10 mg Oral QHS   pantoprazole (PROTONIX) IV  40 mg Intravenous Q12H   polyethylene glycol  17 g Per Tube Daily   risperiDONE  1 mg Oral QHS   sucralfate  1 g Per Tube TID WC & HS   tamsulosin  0.4 mg Oral QPC supper   Continuous Infusions:  heparin 1,600 Units/hr (10/25/23 1229)     LOS: 2 days   Critical Care Procedure Note Authorized and Performed by: Maryln Manuel MD  Total Critical Care time:  60 mins Due to a high probability of clinically significant, life threatening deterioration, the patient required my highest level of preparedness to intervene emergently and I personally spent this critical care time directly and personally managing the patient.  This critical care time included obtaining a history; examining the patient, pulse oximetry; ordering and review of studies; arranging urgent treatment with development of a management plan; evaluation of patient's response of  treatment; frequent reassessment; and discussions with other providers.  This critical care time was performed to assess and manage the high probability of imminent and life threatening deterioration that could result in multi-organ failure.  It was exclusive of separately billable procedures and treating other patients and teaching time.    Standley Dakins, MD How to contact the [facility] Attending or Consulting provider 7A - 7P or covering provider during after hours 7P -7A, for this patient?  Check the care team in [facility] and look for a) attending/consulting [facility] provider listed and b) the [facility] team listed Log into [email or web address] to find provider on call.  Locate the [facility] provider you are looking for under [facility] and page to a number that you can be directly reached. If you still have difficulty reaching the provider, please page the [facility] (Director on Call) for the Hospitalists listed on amion for assistance.  10/25/2023, 2:25 PM

## 2023-10-25 NOTE — Progress Notes (Signed)
Gastroenterology Progress Note   Referring Provider: No ref. provider found Primary Care Physician:  Kerri Perches, MD Patient ID: Matthew Molina; [id number]; [birth year]   Subjective:    Patient has been extubated this morning. NGT in place with brown liquid material, 700cc. RN advanced NGT after arriving this am, per radiologist recommendations. Patient denies abdominal pain.   Objective:   Vital signs in last 24 hours: Temp:  [97.1 F (36.2 C)-99 F (37.2 C)] 97.4 F (36.3 C) (11/07 0726) Pulse Rate:  [62-88] 88 (11/07 0751) Resp:  [14-26] 14 (11/07 0751) BP: (117-173)/(79-112) 165/92 (11/07 0751) SpO2:  [92 %-100 %] 100 % (11/07 0756) FiO2 (%):  [30 %-100 %] 30 % (11/07 0312) Weight:  [88 kg] 88 kg (11/06 1611) Last BM Date : 10/22/23 General:   Alert,  Well-developed, well-nourished, pleasant and cooperative in NAD Head:  Normocephalic and atraumatic. Eyes:  Sclera clear, no icterus.  Abdomen:  Soft, nontender and nondistended.   Normal bowel sounds, without guarding, and without rebound.   Neurologic:  Alert   Psych:  Alert and cooperative. Normal mood and affect.  Intake/Output from previous day: 11/06 0701 - 11/07 0700 In: 578.1 [I.V.:174.9; IV Piggyback:403.2] Out: 500 [Urine:500] Intake/Output this shift: Total I/O In: 73.1 [I.V.:73.1] Out: -   Lab Results: CBC Recent Labs    10/23/23 1000 10/24/23 0002 10/25/23 0441  WBC 6.0 5.9 5.4  HGB 11.1* 9.9* 10.7*  HCT 35.2* 31.2* 35.3*  MCV 89.6 89.4 93.4  PLT 332 279 257   BMET Recent Labs    10/23/23 1000 10/24/23 0852 10/25/23 0441  NA 132* 134* 136  K 3.1* 2.8* 3.5  CL 91* 96* 101  CO2 26 26 19*  GLUCOSE 151* 80 109*  BUN 20 18 20   CREATININE 1.53* 1.24 1.26*  CALCIUM 10.3 9.3 9.6   LFTs Recent Labs    10/23/23 1000 10/25/23 0441  BILITOT 1.0 1.0  ALKPHOS 36* 30*  AST 14* 10*  ALT 17 15  PROT 7.3 6.5  ALBUMIN 3.8 3.4*   Recent Labs    10/23/23 1000  LIPASE 84*    PT/INR No results for input(s): "LABPROT", "INR" in the last 72 hours.       Imaging Studies: DG CHEST PORT 1 VIEW  Result Date: 10/24/2023 CLINICAL DATA:  Intubation. EXAM: PORTABLE CHEST 1 VIEW COMPARISON:  10/23/2023. FINDINGS: The heart size and mediastinal contours are stable. There is atherosclerotic calcification of the aorta. Mild airspace disease is noted at the left lung base. No effusion or pneumothorax is seen. An endotracheal tube terminates 4.4 cm above the carina. The side port of an enteric tube is above the level of the diaphragm and should be advanced 14 cm. IMPRESSION: 1. Mild airspace disease at the left lung base, likely atelectasis. 2. Endotracheal tube terminates 4.4 cm above the carina. 3. The enteric tube terminates above the level of the diaphragm and should be advanced 14 cm. Electronically Signed   By: Thornell Sartorius M.D.   On: 10/24/2023 20:14   CT Angio Chest/Abd/Pel for Dissection W and/or W/WO  Result Date: 10/24/2023 CLINICAL DATA:  Arterial embolism suspected, non-extremity, determine source rule out chest PE, rule out GI ischemia per Dr. Jena Gauss EXAM: CT ANGIOGRAPHY CHEST, ABDOMEN AND PELVIS TECHNIQUE: Non-contrast CT of the chest was initially obtained. Multidetector CT imaging through the chest, abdomen and pelvis was performed using the standard protocol during bolus administration of intravenous contrast. Multiplanar reconstructed images and MIPs were obtained and  reviewed to evaluate the vascular anatomy. RADIATION DOSE REDUCTION: This exam was performed according to the departmental dose-optimization program which includes automated exposure control, adjustment of the mA and/or kV according to patient size and/or use of iterative reconstruction technique. CONTRAST:  75mL OMNIPAQUE IOHEXOL 350 MG/ML SOLN COMPARISON:  10/23/2023, 09/30/2023 FINDINGS: CTA CHEST FINDINGS Cardiovascular: Heart is borderline in size. Scattered coronary artery and aortic calcifications.  4 cm ascending thoracic aortic aneurysm. No dissection. Pulmonary arteries are not adequately opacified to evaluate for pulmonary emboli on this aortic/dissection study. Mediastinum/Nodes: No mediastinal, hilar, or axillary adenopathy. Endotracheal tube in the mid to lower trachea. Thyroid and esophagus unremarkable. Lungs/Pleura: Small left pleural effusion, slightly increased since prior study. Increasing left basilar atelectasis or infiltrate. Musculoskeletal: Chest wall soft tissues are unremarkable. No acute bony abnormality. Review of the MIP images confirms the above findings. CTA ABDOMEN AND PELVIS FINDINGS VASCULAR Aorta: Aortic atherosclerosis.  No aneurysm or dissection. Celiac: Patent without evidence of aneurysm, dissection, vasculitis or significant stenosis. SMA: Patent without evidence of aneurysm, dissection, vasculitis or significant stenosis. Renals: Single left renal artery and 2 right renal arteries. No evidence of stenosis. Scattered calcifications. IMA: Patent Inflow: Moderate calcifications through the iliofemoral vessels. No aneurysm, dissection or significant stenosis. Veins: No obvious venous abnormality within the limitations of this arterial phase study. Review of the MIP images confirms the above findings. NON-VASCULAR Hepatobiliary: No focal hepatic abnormality. Gallbladder unremarkable. Pancreas: No focal abnormality or ductal dilatation. Spleen: No focal abnormality.  Normal size. Adrenals/Urinary Tract: No adrenal nodules. Bilateral renal cysts appear benign with no additional follow-up recommended. No stones or hydronephrosis. Small layering mobile stones in the left side of the urinary bladder. Stomach/Bowel: Marked distention of the stomach again noted. Duodenal bulb is distended with fluid as well. There appears to be circumferential wall thickening within the descending duodenum with surrounding inflammation/stranding. Overall, this appears slightly worsened since prior study.  Remainder of small bowel decompressed. Colonic diverticulosis. No active diverticulitis. Lymphatic: No adenopathy Reproductive: Prostate enlargement. Other: No free fluid or free air. Musculoskeletal: No acute bony abnormality. Review of the MIP images confirms the above findings. IMPRESSION: No evidence of aortic aneurysm or dissection. Aortic atherosclerosis. Mesenteric vessels appear widely patent. Unable to assess/evaluate for pulmonary emboli given the bolus timing to adequately opacify and evaluate the aorta. Coronary artery disease. Small left pleural effusion with left basilar atelectasis or infiltrate, increasing since prior study. Marked distention of the stomach with fluid and air. This appears to be due to wall thickening and surrounding inflammation in the region of the descending duodenum. This could be related to severe duodenitis or possibly duodenal wall hematoma. Findings concerning for gastric outlet obstruction. The patient may benefit from NG tube decompression. Electronically Signed   By: Charlett Nose M.D.   On: 10/24/2023 19:22   DG Abdomen 1 View  Result Date: 10/23/2023 CLINICAL DATA:  Nasogastric tube placement EXAM: ABDOMEN - 1 VIEW COMPARISON:  None Available. FINDINGS: The nasogastric tube is looped within the distal esophagus with its tip within the distal esophagus overlying T7. The abdominal gas pattern is nonspecific due to a paucity of intra-abdominal gas. Contrast is seen within a nondilated renal collecting system bilaterally. IMPRESSION: 1. Nasogastric tube looped within the distal esophagus with its tip within the distal esophagus overlying T7. Repositioning is recommended. Electronically Signed   By: Helyn Numbers M.D.   On: 10/23/2023 18:16   US ABDOMEN LIMITED WITH LIVER DOPPLER  Result Date: 10/23/2023 CLINICAL DATA:  Portal vein  thrombus EXAM: DUPLEX ULTRASOUND OF LIVER TECHNIQUE: Color and duplex Doppler ultrasound was performed to evaluate the hepatic in-flow and  out-flow vessels. COMPARISON:  CT 10/23/2023 FINDINGS: Liver: Normal parenchymal echogenicity. Normal hepatic contour without nodularity. No focal lesion, mass or intrahepatic biliary ductal dilatation. Limited images of the gallbladder are unremarkable. Main Portal Vein size: 20 mm cm Portal Vein Velocities Main Prox:  69 cm/sec Main Mid: 48 cm/sec Main Dist:  40 cm/sec Right: 29 cm/sec Left: 29 cm/sec Flow within the main, right, and left portal veins is hepatopetal (antegrade) Hepatic Vein Velocities Right:  81 cm/sec Middle:  33 cm/sec Left:  36 cm/sec Flow within hepatic veins demonstrates appropriate directional flow (hepatofugal) IVC: Present and patent with normal respiratory phasicity. Hepatic Artery Velocity:  95 cm/sec Splenic Vein Velocity:  39 cm/sec Spleen: 10.3 cm x 9.6 cm x 3.7 cm with a total volume of 191 cm^3 (411 cm^3 is upper limit normal) Portal Vein Occlusion/Thrombus: Not identified. Focal thrombus noted on prior CT examination is not visualized on the current exam. Splenic Vein Occlusion/Thrombus: No Ascites: None Varices: None IMPRESSION: 1. Normal hepatic Doppler examination. No evidence of portal vein thrombosis. Electronically Signed   By: Helyn Numbers M.D.   On: 10/23/2023 18:14   CT ABDOMEN PELVIS W CONTRAST  Result Date: 10/23/2023 CLINICAL DATA:  Several day history of abdominal pain and emesis EXAM: CT ABDOMEN AND PELVIS WITH CONTRAST TECHNIQUE: Multidetector CT imaging of the abdomen and pelvis was performed using the standard protocol following bolus administration of intravenous contrast. RADIATION DOSE REDUCTION: This exam was performed according to the departmental dose-optimization program which includes automated exposure control, adjustment of the mA and/or kV according to patient size and/or use of iterative reconstruction technique. CONTRAST:  OMNIPAQUE IOHEXOL 300 MG/ML  SOLN COMPARISON:  CT abdomen and pelvis dated 09/30/2023 FINDINGS: Lower chest: No focal  consolidation or pulmonary nodule in the lung bases. Pleural effusion. Partially imaged heart size is normal. Coronary artery calcifications. Partially imaged hypodensity within a right lower lobe pulmonary artery (2:1). Hepatobiliary: No focal hepatic lesions. No intra or extrahepatic biliary ductal dilation. Normal gallbladder. Pancreas: Mild peripheral hypodensity involving the pancreatic head and neck adjacent to the duodenal inflammation. No focal lesions or main ductal dilation. Spleen: Normal in size without focal abnormality. Adrenals/Urinary Tract: No adrenal nodules. Bilateral renal cysts, a few of which are minimally complicated. No specific follow-up imaging recommended. No hydronephrosis or calculi. Mobile stones within the urinary bladder. Stomach/Bowel: Circumferential mural thickening of the distal esophagus. Markedly distended stomach to the level of the proximal duodenum where there is again circumferential mural thickening and heterogeneous hypoattenuation no evidence of bowel wall thickening, distention, or inflammatory changes. Colonic diverticulosis without acute diverticulitis. Normal appendix. Vascular/Lymphatic: Aortic atherosclerosis. New focal hypoattenuation within the upstream portal vein (2:26). No enlarged abdominal or pelvic lymph nodes. Reproductive: Markedly enlarged prostate gland. Other: No free fluid, fluid collection, or free air. Musculoskeletal: No acute or abnormal lytic or blastic osseous lesions. Multilevel degenerative changes of the partially imaged thoracic and lumbar spine. Small fat-containing paraumbilical hernia. IMPRESSION: 1. Markedly distended stomach to the level of the proximal duodenum where there is again circumferential mural thickening and heterogeneous hypoattenuation at the site of previously noted duodenitis. Findings suspicious for gastric outlet obstruction. 2. New focal hypoattenuation within the upstream portal vein, suspicious for nonocclusive  thrombosis. 3. Partially imaged hypodensity within a right lower lobe pulmonary artery may be artifactual or represent a pulmonary embolism. 4. Mild peripheral hypodensity involving  the pancreatic head and neck adjacent to the duodenal inflammation, which may be reactive. 5. Circumferential mural thickening of the distal esophagus, which may be due to esophagitis. 6. Markedly enlarged prostate gland. 7. Mobile stones within the urinary bladder. 8.  Aortic Atherosclerosis (ICD10-I70.0). Critical Value/emergent results were called by telephone at the time of interpretation on 10/23/2023 at 1:51 pm to provider [facility], who verbally acknowledged these results. Electronically Signed   By: Agustin Cree M.D.   On: 10/23/2023 13:55   DG Chest Portable 1 View  Result Date: 10/23/2023 CLINICAL DATA:  Weakness. EXAM: PORTABLE CHEST 1 VIEW COMPARISON:  December 08, 2022. FINDINGS: The heart size and mediastinal contours are within normal limits. Both lungs are clear. The visualized skeletal structures are unremarkable. IMPRESSION: No active disease. Electronically Signed   By: Lupita Raider M.D.   On: 10/23/2023 12:11   CT CHEST ABDOMEN PELVIS WO CONTRAST  Result Date: 09/30/2023 CLINICAL DATA:  Altered mental status and lethargy.  Sepsis. EXAM: CT CHEST, ABDOMEN AND PELVIS WITHOUT CONTRAST TECHNIQUE: Multidetector CT imaging of the chest, abdomen and pelvis was performed following the standard protocol without IV contrast. RADIATION DOSE REDUCTION: This exam was performed according to the departmental dose-optimization program which includes automated exposure control, adjustment of the mA and/or kV according to patient size and/or use of iterative reconstruction technique. COMPARISON:  CT abdomen pelvis dated September 08, 2023. CT chest dated March 05, 2023. FINDINGS: CT CHEST FINDINGS Cardiovascular: No significant vascular findings. Normal heart size. No pericardial effusion. Unchanged mildly dilated  ascending thoracic aorta measuring up 4.3 cm in diameter. Coronary, aortic arch, and branch vessel atherosclerotic vascular disease. Mediastinum/Nodes: No enlarged mediastinal, hilar, or axillary lymph nodes. Unchanged circumferential wall thickening of the distal esophagus. Normal thyroid gland. Lungs/Pleura: Small amount of debris in the trachea. New patchy tree-in-bud centrilobular micro nodularity in the right upper lobe and right middle lobe. Subsegmental atelectasis in the right middle lobe. Unchanged trace left pleural effusion. Mild paraseptal emphysema. No pneumothorax. Musculoskeletal: No acute or significant osseous findings. CT ABDOMEN PELVIS FINDINGS Hepatobiliary: No focal liver abnormality is seen. No gallstones, gallbladder wall thickening, or biliary dilatation. Pancreas: Mild inflammatory change in the region of the pancreatic head is likely related to descending duodenal inflammation as seen on the prior CT. No ductal dilatation. Spleen: Normal in size without focal abnormality. Adrenals/Urinary Tract: Unchanged 1.2 cm right adrenal myelolipoma. No follow-up imaging is recommended. Unchanged nodular thickening of the left adrenal gland. Unchanged bilateral renal simple and hemorrhagic cysts. No follow-up imaging is recommended. No renal calculi or hydronephrosis. New small hyperdensity in the right posterolateral bladder. No bladder wall thickening. Stomach/Bowel: Unchanged small hiatal hernia. The stomach remains distended. Prominent wall thickening of the descending duodenum is not significantly changed, although less well evaluated without intravenous contrast compared to the prior study. Remaining small bowel is unremarkable. Colonic diverticulosis. Normal appendix. Vascular/Lymphatic: Aortic atherosclerosis. No enlarged abdominal or pelvic lymph nodes. Reproductive: Unchanged prostatomegaly. Other: No free fluid or pneumoperitoneum. Musculoskeletal: No acute or significant osseous findings.  IMPRESSION: 1. New patchy tree-in-bud centrilobular micronodularity in the right upper lobe and right middle lobe, consistent with aspiration pneumonia in the setting of gastric outlet obstruction from severe duodenal inflammation. 2. Unchanged prominent wall thickening and inflammation of the descending duodenum, most likely peptic ulcer disease or duodenitis. No extraluminal air or fluid collection. 3. New small hyperdensity in the right posterolateral bladder, potentially tiny layering bladder calculi. 4. Unchanged circumferential wall thickening of the distal  esophagus, likely chronic esophagitis. 5. Unchanged 4.3 cm ascending thoracic aortic aneurysm. Recommend annual imaging followup by CTA or MRA. This recommendation follows 2010 ACCF/AHA/AATS/ACR/ASA/SCA/SCAI/SIR/STS/SVM Guidelines for the Diagnosis and Management of Patients with Thoracic Aortic Disease. Circulation. 2010; 121: Q034-V425. Aortic aneurysm NOS (ICD10-I71.9) 6. Aortic Atherosclerosis (ICD10-I70.0) and Emphysema (ICD10-J43.9). Electronically Signed   By: Obie Dredge M.D.   On: 09/30/2023 11:45  [2 weeks]  Assessment:   72 y.o. year old male with history of COPD, diabetes, ischemic colitis in 2021, HLD, HTN, depression, chronic epigastric pain, constipation, esophageal wall thickening, duodenitis noted on imaging this year, recent admission earlier this month with pneumonia, concerns for gastric outlet obstruction in the setting of duodenal inflammation on CT.  EGD completed 10/02/2023 showing esophagitis, few nonbleeding superficial gastric ulcers in the gastric body, duodenitis in the second portion of the duodenum with visualization difficult due to presence of food and angulation of the duodenum with recommendations to repeat EGD in 1-2 weeks, maintain on PPI twice daily and Carafate.   He has now returning to the emergency room today with decreased p.o. intake, worsening postprandial abdominal pain, belching with regurgitation,  weakness. CT with marked distention of the stomach to the level of the duodenum where there is circumferential thickening and heterogenous hypoattenuation at the site of the patient's previous duodenitis. Also with new hypodensity upstream portal vein suspicious for nonocclusive thrombus. There is also a partially imaged hypodensity in the right upper lobe pulmonary artery which may represent artifact versus PE. GI consulted due to concerns for GOO.    Postprandial abdominal pain, belching/regurgitation of food, possible GOO on CT:  EGD completed yesterday showing high-grade duodenal obstruction producing massive gastric dilation, 10 cm segment of proximal D2 mucosa edematous and inflamed appearing cannot rule out underlying malignancy, appears to be worsening over time without discrete etiology.  Biopsies pending.  NG tube has been placed. Patient has been seen by general surgery with plans for gastrojejunostomy.  Possible PE/possible portal vein thrombus:  Hospitalist has started patient on heparin with plans for CTA chest abdomen pelvis on 11/6 if renal function remains stable.  CTA completed with no evidence of mesenteric ischemia.  Unfortunately PE cannot be ruled out due to timing of contrast bolus.  Liver Doppler without evidence of portal vein thrombosis. .   Abnormal pancreatic imaging:  CT this admission with mild peripheral hypodensity involving the pancreatic head and neck adjacent to the duodenal inflammation, which may be reactive. This was seen on previous imaging as well with recommendations for outpatient dedicated imaging of the pancreas.    Plan:   Continue IV PPI twice daily. F/u pending path. Continue NGT, surgery with plans for gastrojejunostomy tomorrow.  Plans for outpatient dedicated pancreatic imaging.    LOS: 2 days   Leanna Battles. Dixon Boos [facility] [phone] 11/7/20248:22 AM

## 2023-10-26 ENCOUNTER — Inpatient Hospital Stay (HOSPITAL_COMMUNITY): Payer: 59 | Admitting: Certified Registered"

## 2023-10-26 ENCOUNTER — Encounter (HOSPITAL_COMMUNITY): Admission: EM | Disposition: E | Payer: Self-pay | Source: Home / Self Care | Attending: Family Medicine

## 2023-10-26 ENCOUNTER — Encounter (HOSPITAL_COMMUNITY): Payer: Self-pay | Admitting: Internal Medicine

## 2023-10-26 ENCOUNTER — Inpatient Hospital Stay (HOSPITAL_COMMUNITY): Payer: 59

## 2023-10-26 DIAGNOSIS — I2510 Atherosclerotic heart disease of native coronary artery without angina pectoris: Secondary | ICD-10-CM | POA: Diagnosis not present

## 2023-10-26 DIAGNOSIS — R1114 Bilious vomiting: Secondary | ICD-10-CM

## 2023-10-26 DIAGNOSIS — E43 Unspecified severe protein-calorie malnutrition: Secondary | ICD-10-CM | POA: Diagnosis present

## 2023-10-26 DIAGNOSIS — K311 Adult hypertrophic pyloric stenosis: Secondary | ICD-10-CM

## 2023-10-26 DIAGNOSIS — I2699 Other pulmonary embolism without acute cor pulmonale: Secondary | ICD-10-CM | POA: Diagnosis not present

## 2023-10-26 DIAGNOSIS — I81 Portal vein thrombosis: Secondary | ICD-10-CM | POA: Diagnosis not present

## 2023-10-26 HISTORY — PX: GASTROJEJUNOSTOMY: SHX1697

## 2023-10-26 LAB — POCT I-STAT, CHEM 8
BUN: 18 mg/dL (ref 8–23)
Calcium, Ion: 1.25 mmol/L (ref 1.15–1.40)
Chloride: 97 mmol/L — ABNORMAL LOW (ref 98–111)
Creatinine, Ser: 1.4 mg/dL — ABNORMAL HIGH (ref 0.61–1.24)
Glucose, Bld: 97 mg/dL (ref 70–99)
HCT: 36 % — ABNORMAL LOW (ref 39.0–52.0)
Hemoglobin: 12.2 g/dL — ABNORMAL LOW (ref 13.0–17.0)
Potassium: 3.7 mmol/L (ref 3.5–5.1)
Sodium: 137 mmol/L (ref 135–145)
TCO2: 28 mmol/L (ref 22–32)

## 2023-10-26 LAB — COMPREHENSIVE METABOLIC PANEL
ALT: 15 U/L (ref 0–44)
AST: 13 U/L — ABNORMAL LOW (ref 15–41)
Albumin: 3.4 g/dL — ABNORMAL LOW (ref 3.5–5.0)
Alkaline Phosphatase: 32 U/L — ABNORMAL LOW (ref 38–126)
Anion gap: 10 (ref 5–15)
BUN: 17 mg/dL (ref 8–23)
CO2: 27 mmol/L (ref 22–32)
Calcium: 9.8 mg/dL (ref 8.9–10.3)
Chloride: 99 mmol/L (ref 98–111)
Creatinine, Ser: 1.28 mg/dL — ABNORMAL HIGH (ref 0.61–1.24)
GFR, Estimated: 59 mL/min — ABNORMAL LOW (ref >60)
Glucose, Bld: 91 mg/dL (ref 70–99)
Potassium: 2.5 mmol/L — CL (ref 3.5–5.1)
Sodium: 136 mmol/L (ref 135–145)
Total Bilirubin: 0.9 mg/dL (ref <1.2)
Total Protein: 6.4 g/dL — ABNORMAL LOW (ref 6.5–8.1)

## 2023-10-26 LAB — CBC
HCT: 32.8 % — ABNORMAL LOW (ref 39.0–52.0)
Hemoglobin: 10 g/dL — ABNORMAL LOW (ref 13.0–17.0)
MCH: 27.2 pg (ref 26.0–34.0)
MCHC: 30.5 g/dL (ref 30.0–36.0)
MCV: 89.1 fL (ref 80.0–100.0)
Platelets: 275 10*3/uL (ref 150–400)
RBC: 3.68 MIL/uL — ABNORMAL LOW (ref 4.22–5.81)
RDW: 16.7 % — ABNORMAL HIGH (ref 11.5–15.5)
WBC: 5.5 10*3/uL (ref 4.0–10.5)
nRBC: 0 % (ref 0.0–0.2)

## 2023-10-26 LAB — HEPARIN LEVEL (UNFRACTIONATED): Heparin Unfractionated: 0.1 [IU]/mL — ABNORMAL LOW (ref 0.30–0.70)

## 2023-10-26 LAB — GLUCOSE, CAPILLARY
Glucose-Capillary: 100 mg/dL — ABNORMAL HIGH (ref 70–99)
Glucose-Capillary: 111 mg/dL — ABNORMAL HIGH (ref 70–99)
Glucose-Capillary: 128 mg/dL — ABNORMAL HIGH (ref 70–99)
Glucose-Capillary: 189 mg/dL — ABNORMAL HIGH (ref 70–99)
Glucose-Capillary: 70 mg/dL (ref 70–99)

## 2023-10-26 LAB — SURGICAL PATHOLOGY

## 2023-10-26 LAB — TRIGLYCERIDES: Triglycerides: 125 mg/dL (ref <150)

## 2023-10-26 LAB — MAGNESIUM: Magnesium: 1.7 mg/dL (ref 1.7–2.4)

## 2023-10-26 LAB — PHOSPHORUS: Phosphorus: 2.4 mg/dL — ABNORMAL LOW (ref 2.5–4.6)

## 2023-10-26 SURGERY — GASTROJEJUNOSTOMY
Anesthesia: General

## 2023-10-26 MED ORDER — LIDOCAINE HCL (PF) 2 % IJ SOLN
INTRAMUSCULAR | Status: AC
Start: 2023-10-26 — End: ?
  Filled 2023-10-26: qty 5

## 2023-10-26 MED ORDER — ROCURONIUM BROMIDE 10 MG/ML (PF) SYRINGE
PREFILLED_SYRINGE | INTRAVENOUS | Status: DC | PRN
  Administered 2023-10-26: 40 mg via INTRAVENOUS

## 2023-10-26 MED ORDER — CEFAZOLIN SODIUM-DEXTROSE 2-4 GM/100ML-% IV SOLN
INTRAVENOUS | Status: AC
  Filled 2023-10-26: qty 100

## 2023-10-26 MED ORDER — TRACE MINERALS CU-MN-SE-ZN 300-55-60-3000 MCG/ML IV SOLN
INTRAVENOUS | Status: AC
  Filled 2023-10-26: qty 960

## 2023-10-26 MED ORDER — CHLORHEXIDINE GLUCONATE CLOTH 2 % EX PADS
6.0000 | MEDICATED_PAD | Freq: Once | CUTANEOUS | Status: DC
  Administered 2023-10-26: 6 via TOPICAL

## 2023-10-26 MED ORDER — SODIUM CHLORIDE 0.9% FLUSH: 10.0000 mL | Freq: Two times a day (BID) | INTRAVENOUS | Status: DC

## 2023-10-26 MED ORDER — BUPIVACAINE HCL (PF) 0.5 % IJ SOLN
INTRAMUSCULAR | Status: AC
  Filled 2023-10-26: qty 30

## 2023-10-26 MED ORDER — MAGNESIUM SULFATE 4 GM/100ML IV SOLN
4.0000 g | Freq: Once | INTRAVENOUS | Status: AC
  Administered 2023-10-26: 4 g via INTRAVENOUS
  Filled 2023-10-26: qty 100

## 2023-10-26 MED ORDER — PHENYLEPHRINE 80 MCG/ML (10ML) SYRINGE FOR IV PUSH (FOR BLOOD PRESSURE SUPPORT)
PREFILLED_SYRINGE | INTRAVENOUS | Status: DC | PRN
  Administered 2023-10-26: 80 ug via INTRAVENOUS
  Administered 2023-10-26 (×5): 160 ug via INTRAVENOUS
  Administered 2023-10-26: 80 ug via INTRAVENOUS

## 2023-10-26 MED ORDER — ONDANSETRON HCL 4 MG/2ML IJ SOLN
INTRAMUSCULAR | Status: DC | PRN
  Administered 2023-10-26: 4 mg via INTRAVENOUS

## 2023-10-26 MED ORDER — CHLORHEXIDINE GLUCONATE CLOTH 2 % EX PADS: 6.0000 | MEDICATED_PAD | Freq: Once | CUTANEOUS | Status: DC

## 2023-10-26 MED ORDER — SODIUM CHLORIDE 0.9 % IR SOLN
Status: DC | PRN
  Administered 2023-10-26: 1000 mL

## 2023-10-26 MED ORDER — HEPARIN (PORCINE) 25000 UT/250ML-% IV SOLN
1750.0000 [IU]/h | INTRAVENOUS | Status: DC
  Administered 2023-10-26 – 2023-10-27 (×2): 1600 [IU]/h via INTRAVENOUS
  Administered 2023-10-28: 1900 [IU]/h via INTRAVENOUS
  Administered 2023-10-28: 1600 [IU]/h via INTRAVENOUS
  Administered 2023-10-29: 2050 [IU]/h via INTRAVENOUS
  Administered 2023-10-29: 1900 [IU]/h via INTRAVENOUS
  Administered 2023-10-30: 2050 [IU]/h via INTRAVENOUS
  Administered 2023-10-31: 1950 [IU]/h via INTRAVENOUS
  Administered 2023-10-31: 2050 [IU]/h via INTRAVENOUS
  Administered 2023-11-01 (×2): 1950 [IU]/h via INTRAVENOUS
  Administered 2023-11-02: 1750 [IU]/h via INTRAVENOUS
  Filled 2023-10-26 (×13): qty 250

## 2023-10-26 MED ORDER — OXYCODONE HCL 5 MG PO TABS
5.0000 mg | ORAL_TABLET | Freq: Four times a day (QID) | ORAL | Status: DC | PRN
  Administered 2023-10-27 – 2023-10-28 (×4): 5 mg
  Filled 2023-10-26 (×4): qty 1

## 2023-10-26 MED ORDER — FENTANYL CITRATE (PF) 100 MCG/2ML IJ SOLN
INTRAMUSCULAR | Status: AC
  Filled 2023-10-26: qty 2

## 2023-10-26 MED ORDER — FAT EMUL FISH OIL/PLANT BASED 20% (SMOFLIPID)IV EMUL
250.0000 mL | INTRAVENOUS | Status: AC
  Administered 2023-10-26: 250 mL via INTRAVENOUS
  Filled 2023-10-26: qty 250

## 2023-10-26 MED ORDER — FENTANYL CITRATE (PF) 250 MCG/5ML IJ SOLN
INTRAMUSCULAR | Status: DC | PRN
  Administered 2023-10-26: 100 ug via INTRAVENOUS
  Administered 2023-10-26 (×2): 50 ug via INTRAVENOUS

## 2023-10-26 MED ORDER — LIDOCAINE 2% (20 MG/ML) 5 ML SYRINGE
INTRAMUSCULAR | Status: DC | PRN
  Administered 2023-10-26: 100 mg via INTRAVENOUS

## 2023-10-26 MED ORDER — SUGAMMADEX SODIUM 200 MG/2ML IV SOLN
INTRAVENOUS | Status: DC | PRN
  Administered 2023-10-26: 400 mg via INTRAVENOUS

## 2023-10-26 MED ORDER — SUCCINYLCHOLINE CHLORIDE 200 MG/10ML IV SOSY
PREFILLED_SYRINGE | INTRAVENOUS | Status: AC
Start: 2023-10-26 — End: ?
  Filled 2023-10-26: qty 10

## 2023-10-26 MED ORDER — SODIUM CHLORIDE 0.9% FLUSH
10.0000 mL | INTRAVENOUS | Status: DC | PRN
Start: 2023-10-26 — End: 2023-11-04

## 2023-10-26 MED ORDER — BUPIVACAINE HCL (PF) 0.5 % IJ SOLN
INTRAMUSCULAR | Status: DC | PRN
  Administered 2023-10-26: 30 mL

## 2023-10-26 MED ORDER — POTASSIUM CHLORIDE 10 MEQ/100ML IV SOLN
10.0000 meq | INTRAVENOUS | Status: AC
  Administered 2023-10-26 (×5): 10 meq via INTRAVENOUS
  Filled 2023-10-26 (×4): qty 100

## 2023-10-26 MED ORDER — ACETAMINOPHEN 500 MG PO TABS
1000.0000 mg | ORAL_TABLET | Freq: Four times a day (QID) | ORAL | Status: DC
  Administered 2023-10-26 – 2023-10-29 (×9): 1000 mg
  Filled 2023-10-26 (×11): qty 2

## 2023-10-26 MED ORDER — LACTATED RINGERS IV SOLN: INTRAVENOUS | Status: DC | PRN

## 2023-10-26 MED ORDER — CHLORHEXIDINE GLUCONATE 0.12 % MT SOLN
15.0000 mL | Freq: Once | OROMUCOSAL | Status: AC
  Administered 2023-10-26: 15 mL via OROMUCOSAL

## 2023-10-26 MED ORDER — METOPROLOL TARTRATE 5 MG/5ML IV SOLN
2.5000 mg | Freq: Four times a day (QID) | INTRAVENOUS | Status: DC
  Administered 2023-10-26 – 2023-10-28 (×10): 2.5 mg via INTRAVENOUS
  Filled 2023-10-26 (×10): qty 5

## 2023-10-26 MED ORDER — ONDANSETRON HCL 4 MG/2ML IJ SOLN
INTRAMUSCULAR | Status: AC
  Filled 2023-10-26: qty 2

## 2023-10-26 MED ORDER — SODIUM CHLORIDE 0.9% FLUSH
10.0000 mL | Freq: Two times a day (BID) | INTRAVENOUS | Status: DC
  Administered 2023-10-26 – 2023-11-04 (×14): 10 mL

## 2023-10-26 MED ORDER — PROPOFOL 10 MG/ML IV BOLUS
INTRAVENOUS | Status: DC | PRN
  Administered 2023-10-26: 150 mg via INTRAVENOUS

## 2023-10-26 MED ORDER — CEFAZOLIN SODIUM-DEXTROSE 2-4 GM/100ML-% IV SOLN
2.0000 g | INTRAVENOUS | Status: AC
  Administered 2023-10-26: 2 g via INTRAVENOUS

## 2023-10-26 MED ORDER — SUCCINYLCHOLINE CHLORIDE 200 MG/10ML IV SOSY
PREFILLED_SYRINGE | INTRAVENOUS | Status: DC | PRN
  Administered 2023-10-26: 140 mg via INTRAVENOUS

## 2023-10-26 SURGICAL SUPPLY — 40 items
CHLORAPREP W/TINT 26 (MISCELLANEOUS) ×1 IMPLANT
CLOTH BEACON ORANGE TIMEOUT ST (SAFETY) ×1 IMPLANT
COVER LIGHT HANDLE STERIS (MISCELLANEOUS) ×2 IMPLANT
DRAPE WARM FLUID 44X44 (DRAPES) ×1 IMPLANT
DRSG OPSITE POSTOP 4X10 (GAUZE/BANDAGES/DRESSINGS) ×1 IMPLANT
ELECT REM PT RETURN 9FT ADLT (ELECTROSURGICAL) ×1
ELECTRODE REM PT RTRN 9FT ADLT (ELECTROSURGICAL) ×1 IMPLANT
GLOVE BIOGEL PI IND STRL 6.5 (GLOVE) ×1 IMPLANT
GLOVE BIOGEL PI IND STRL 7.0 (GLOVE) ×2 IMPLANT
GLOVE ECLIPSE 6.5 STRL STRAW (GLOVE) ×1 IMPLANT
GLOVE SURG SS PI 7.5 STRL IVOR (GLOVE) ×2 IMPLANT
GOWN STRL REUS W/TWL LRG LVL3 (GOWN DISPOSABLE) ×3 IMPLANT
HANDLE SUCTION POOLE (INSTRUMENTS) IMPLANT
INST SET MAJOR GENERAL (KITS) ×1 IMPLANT
KIT TURNOVER KIT A (KITS) ×1 IMPLANT
LIGASURE IMPACT 36 18CM CVD LR (INSTRUMENTS) ×1 IMPLANT
MANIFOLD NEPTUNE II (INSTRUMENTS) ×1 IMPLANT
NDL HYPO 18GX1.5 BLUNT FILL (NEEDLE) ×1 IMPLANT
NEEDLE HYPO 18GX1.5 BLUNT FILL (NEEDLE) ×1
NEEDLE HYPO 22GX1.5 SAFETY (NEEDLE) ×1 IMPLANT
NS IRRIG 1000ML POUR BTL (IV SOLUTION) ×2 IMPLANT
PACK MAJOR ABDOMINAL (CUSTOM PROCEDURE TRAY) ×1 IMPLANT
PAD ARMBOARD 7.5X6 YLW CONV (MISCELLANEOUS) ×1 IMPLANT
PENCIL SMOKE EVACUATOR (MISCELLANEOUS) ×1 IMPLANT
POSITIONER HEAD 8X9X4 ADT (SOFTGOODS) ×1 IMPLANT
RELOAD PROXIMATE 75MM BLUE (ENDOMECHANICALS) ×1
RELOAD STAPLE 75 3.8 BLU REG (ENDOMECHANICALS) ×3 IMPLANT
RETRACTOR WND ALEXIS 18 MED (MISCELLANEOUS) IMPLANT
RTRCTR WOUND ALEXIS 18CM MED (MISCELLANEOUS) ×1
SET BASIN LINEN APH (SET/KITS/TRAYS/PACK) ×1 IMPLANT
SPONGE T-LAP 18X18 ~~LOC~~+RFID (SPONGE) ×1 IMPLANT
STAPLER GUN LINEAR PROX 60 (STAPLE) ×2 IMPLANT
STAPLER PROXIMATE 75MM BLUE (STAPLE) ×1 IMPLANT
STAPLER VISISTAT (STAPLE) IMPLANT
SUCTION POOLE HANDLE (INSTRUMENTS) ×1
SUT NOVA NAB GS-26 0 60 (SUTURE) ×2 IMPLANT
SUT PDS AB 0 CT1 36 (SUTURE) IMPLANT
SUT SILK 3 0 SH CR/8 (SUTURE) ×1 IMPLANT
SUT V-LOC 90 ABS 3-0 VLT V-20 (SUTURE) IMPLANT
TRAY FOLEY SLVR 16FR LF STAT (SET/KITS/TRAYS/PACK) ×1 IMPLANT

## 2023-10-26 NOTE — Op Note (Signed)
[facility] Operative Note  10/26/23  Preoperative Diagnosis: Gastric outlet obstruction   Postoperative Diagnosis: Same   Procedure(s) Performed: Open gastrojejunostomy tube   Surgeon: Theophilus Kinds, DO    Assistants: Franky Macho, MD   Anesthesia: General endotracheal   Anesthesiologist: Dr. Johnnette Litter   Specimens: None   Estimated Blood Loss: 10 cc   Blood Replacement: None    Complications: None   Wound Class: Contaminated   Operative Indications: ***  Findings:***   Procedure: The patient was taken to the operating room and placed supine***. General endotracheal*** anesthesia was induced. Intravenous antibiotics were *** administered per protocol.  An orogastric tube positioned to decompress the stomach.*** The abdomen*** was prepared and draped in the usual sterile fashion.   *** Dr. Marland Kitchen was assisting throughout the procedure and was present for the critical portions of the case.   Final inspection revealed acceptable hemostasis. All counts were correct at the end of the case. The patient was awakened from anesthesia and extubate*** without complication.  The patient went to the PACU in stable condition.***   Theophilus Kinds, DO  [facility] [address] [place], [place] [address] [phone] (office)

## 2023-10-26 NOTE — Progress Notes (Signed)
Initial Nutrition Assessment  DOCUMENTATION CODES:   Severe malnutrition in context of chronic illness  INTERVENTION:   TPN orders per Pharmacy to meet nutrition needs as able. May not be able to meet 100% of nutrition needs d/t national IVF shortage and use of premixed Clinimix.   Monitor magnesium, potassium, and phosphorus BID for at least 3 days, MD to replete as needed, as pt is at risk for refeeding syndrome given severe malnutrition with minimal intake for the past week and baseline low K and phos.  NUTRITION DIAGNOSIS:   Severe Malnutrition related to chronic illness (GOO) as evidenced by severe muscle depletion, severe fat depletion, percent weight loss (8% weight loss x 3 months).  GOAL:   Patient will meet greater than or equal to 90% of their needs  MONITOR:   I & O's, Diet advancement  REASON FOR ASSESSMENT:   Consult New TPN/TNA  ASSESSMENT:   72 yo male admitted with portal vein thrombus, functional GOO with intractable vomiting. PMH includes COPD, DM-2, HTN, HLD, depression, tobacco abuse, CAD, diverticulitis, ischemic colitis, schizophrenia.  EGD 11/6 showed distal duodenal obstruction. CT abdomen showed no mesenteric ischemia. Remains NPO. Plans for gastrojejunostomy today to bypass the GOO. NG tube in place with 2950 ml output x 24 hours. Awaiting PICC line placement for initiation of TPN today.  Patient reports a lot of weight loss recently and minimal intake for several months. Weight history reviewed. 8% weight loss within the past 3 months noted.   Labs reviewed. K 2.5, phos 2.4 CBG: 70-100  Medications reviewed and include colace, novolog, protonix, miralax, carafate. Receiving mag sulfate and potassium chloride for repletion. IVF: D5 at 40 ml/h.   Patient meets criteria for severe malnutrition, given severe depletion of muscle and subcutaneous fat mass.  NUTRITION - FOCUSED PHYSICAL EXAM:  Flowsheet Row Most Recent Value  Orbital Region  Severe depletion  Upper Arm Region Severe depletion  Thoracic and Lumbar Region Mild depletion  Buccal Region Severe depletion  Temple Region Severe depletion  Clavicle Bone Region Severe depletion  Clavicle and Acromion Bone Region Severe depletion  Scapular Bone Region Severe depletion  Dorsal Hand Severe depletion  Patellar Region Severe depletion  Anterior Thigh Region Severe depletion  Posterior Calf Region Severe depletion  Edema (RD Assessment) None  Hair Reviewed  Eyes Reviewed  Mouth Reviewed  Skin Reviewed  Nails Reviewed       Diet Order:   Diet Order             Diet NPO time specified  Diet effective now                   EDUCATION NEEDS:   Not appropriate for education at this time  Skin:  Skin Assessment: Reviewed RN Assessment  Last BM:  11/4  Height:   Ht Readings from Last 1 Encounters:  10/24/23 6' (1.829 m)    Weight:   Wt Readings from Last 1 Encounters:  10/26/23 89.5 kg    Ideal Body Weight:  80.9 kg  BMI:  Body mass index is 26.76 kg/m.  Estimated Nutritional Needs:   Kcal:  2300-2600  Protein:  120-135 gm  Fluid:  2.3-2.6 L   Gabriel Rainwater RD, LDN, CNSC Please refer to Amion for contact information.

## 2023-10-26 NOTE — Progress Notes (Signed)
[facility] Progress Note  Day of Surgery  Subjective: Patient seen and examined.  He is resting comfortably in bed.  He denies abdominal complaints at this time.  He has an NG tube in place that has put out 3L of gastric contents.    Objective: Vital signs in last 24 hours: Temp:  [97.9 F (36.6 C)-98.6 F (37 C)] 98 F (36.7 C) (11/08 1333) Pulse Rate:  [65-90] 89 (11/08 1430) Resp:  [16-27] 23 (11/08 1430) BP: (125-165)/(55-97) 147/91 (11/08 1430) SpO2:  [91 %-100 %] 95 % (11/08 1430) Weight:  [89.5 kg] 89.5 kg (11/08 1333) Last BM Date : 10/22/23  Intake/Output from previous day: 11/07 0701 - 11/08 0700 In: 929 [I.V.:854; NG/GT:75] Out: 3400 [Urine:450; Emesis/NG output:2950] Intake/Output this shift: Total I/O In: 772.5 [I.V.:259.9; IV Piggyback:512.7] Out: 900 [Urine:200; Emesis/NG output:700]  General appearance: alert, cooperative, and no distress GI: Abdomen soft, mild distention, no percussion tenderness, nontender to palpation; no rigidity, guarding, or rebound tenderness  Lab Results:  Recent Labs    10/25/23 0441 10/26/23 0619 10/26/23 1351  WBC 5.4 5.5  --   HGB 10.7* 10.0* 12.2*  HCT 35.3* 32.8* 36.0*  PLT 257 275  --    BMET Recent Labs    10/25/23 0441 10/26/23 0619 10/26/23 1351  NA 136 136 137  K 3.5 2.5* 3.7  CL 101 99 97*  CO2 19* 27  --   GLUCOSE 109* 91 97  BUN 20 17 18   CREATININE 1.26* 1.28* 1.40*  CALCIUM 9.6 9.8  --    PT/INR No results for input(s): "LABPROT", "INR" in the last 72 hours.  Studies/Results: DG Abd Portable 1V  Result Date: 10/25/2023 CLINICAL DATA:  NG placement EXAM: PORTABLE ABDOMEN - 1 VIEW COMPARISON:  Abdominal radiograph dated 10/25/2023. FINDINGS: Interval progression of the enteric tube with tip and side-port in the left upper abdomen likely in the proximal stomach. IMPRESSION: Enteric tube with tip and side-port in the proximal stomach. Electronically Signed   By: Elgie Collard  M.D.   On: 10/25/2023 18:15   [place] EKG SITE RITE  Result Date: 10/25/2023 If Site Rite image not attached, placement could not be confirmed due to current cardiac rhythm.  DG Abd Portable 1V  Result Date: 10/25/2023 CLINICAL DATA:  NG tube placement EXAM: PORTABLE ABDOMEN - 1 VIEW COMPARISON:  KUB 2 days prior, CT abdomen/pelvis 1 day prior FINDINGS: The enteric catheter tip is in the stomach and sidehole is in the distal esophagus. There is a nonobstructive bowel gas pattern. There is no definite free intraperitoneal air. There is no abnormal soft tissue calcification. Contrast is noted in the bladder. There is no acute osseous abnormality. IMPRESSION: Enteric catheter sidehole in the distal esophagus. Recommend advancement by approximately 8 cm. These results will be called to the ordering clinician or representative by the Radiologist Assistant, and communication documented in the PACS or [employer]. Electronically Signed   By: Lesia Hausen M.D.   On: 10/25/2023 13:34   DG CHEST PORT 1 VIEW  Result Date: 10/24/2023 CLINICAL DATA:  Intubation. EXAM: PORTABLE CHEST 1 VIEW COMPARISON:  10/23/2023. FINDINGS: The heart size and mediastinal contours are stable. There is atherosclerotic calcification of the aorta. Mild airspace disease is noted at the left lung base. No effusion or pneumothorax is seen. An endotracheal tube terminates 4.4 cm above the carina. The side port of an enteric tube is above the level of the diaphragm and should be advanced 14 cm. IMPRESSION: 1.  Mild airspace disease at the left lung base, likely atelectasis. 2. Endotracheal tube terminates 4.4 cm above the carina. 3. The enteric tube terminates above the level of the diaphragm and should be advanced 14 cm. Electronically Signed   By: Thornell Sartorius M.D.   On: 10/24/2023 20:14   CT Angio Chest/Abd/Pel for Dissection W and/or W/WO  Result Date: 10/24/2023 CLINICAL DATA:  Arterial embolism suspected, non-extremity, determine  source rule out chest PE, rule out GI ischemia per Dr. Jena Gauss EXAM: CT ANGIOGRAPHY CHEST, ABDOMEN AND PELVIS TECHNIQUE: Non-contrast CT of the chest was initially obtained. Multidetector CT imaging through the chest, abdomen and pelvis was performed using the standard protocol during bolus administration of intravenous contrast. Multiplanar reconstructed images and MIPs were obtained and reviewed to evaluate the vascular anatomy. RADIATION DOSE REDUCTION: This exam was performed according to the departmental dose-optimization program which includes automated exposure control, adjustment of the mA and/or kV according to patient size and/or use of iterative reconstruction technique. CONTRAST:  75mL OMNIPAQUE IOHEXOL 350 MG/ML SOLN COMPARISON:  10/23/2023, 09/30/2023 FINDINGS: CTA CHEST FINDINGS Cardiovascular: Heart is borderline in size. Scattered coronary artery and aortic calcifications. 4 cm ascending thoracic aortic aneurysm. No dissection. Pulmonary arteries are not adequately opacified to evaluate for pulmonary emboli on this aortic/dissection study. Mediastinum/Nodes: No mediastinal, hilar, or axillary adenopathy. Endotracheal tube in the mid to lower trachea. Thyroid and esophagus unremarkable. Lungs/Pleura: Small left pleural effusion, slightly increased since prior study. Increasing left basilar atelectasis or infiltrate. Musculoskeletal: Chest wall soft tissues are unremarkable. No acute bony abnormality. Review of the MIP images confirms the above findings. CTA ABDOMEN AND PELVIS FINDINGS VASCULAR Aorta: Aortic atherosclerosis.  No aneurysm or dissection. Celiac: Patent without evidence of aneurysm, dissection, vasculitis or significant stenosis. SMA: Patent without evidence of aneurysm, dissection, vasculitis or significant stenosis. Renals: Single left renal artery and 2 right renal arteries. No evidence of stenosis. Scattered calcifications. IMA: Patent Inflow: Moderate calcifications through the  iliofemoral vessels. No aneurysm, dissection or significant stenosis. Veins: No obvious venous abnormality within the limitations of this arterial phase study. Review of the MIP images confirms the above findings. NON-VASCULAR Hepatobiliary: No focal hepatic abnormality. Gallbladder unremarkable. Pancreas: No focal abnormality or ductal dilatation. Spleen: No focal abnormality.  Normal size. Adrenals/Urinary Tract: No adrenal nodules. Bilateral renal cysts appear benign with no additional follow-up recommended. No stones or hydronephrosis. Small layering mobile stones in the left side of the urinary bladder. Stomach/Bowel: Marked distention of the stomach again noted. Duodenal bulb is distended with fluid as well. There appears to be circumferential wall thickening within the descending duodenum with surrounding inflammation/stranding. Overall, this appears slightly worsened since prior study. Remainder of small bowel decompressed. Colonic diverticulosis. No active diverticulitis. Lymphatic: No adenopathy Reproductive: Prostate enlargement. Other: No free fluid or free air. Musculoskeletal: No acute bony abnormality. Review of the MIP images confirms the above findings. IMPRESSION: No evidence of aortic aneurysm or dissection. Aortic atherosclerosis. Mesenteric vessels appear widely patent. Unable to assess/evaluate for pulmonary emboli given the bolus timing to adequately opacify and evaluate the aorta. Coronary artery disease. Small left pleural effusion with left basilar atelectasis or infiltrate, increasing since prior study. Marked distention of the stomach with fluid and air. This appears to be due to wall thickening and surrounding inflammation in the region of the descending duodenum. This could be related to severe duodenitis or possibly duodenal wall hematoma. Findings concerning for gastric outlet obstruction. The patient may benefit from NG tube decompression. Electronically Signed  By: Charlett Nose  M.D.   On: 10/24/2023 19:22    Anti-infectives: Anti-infectives (From admission, onward)    Start     Dose/Rate Route Frequency Ordered Stop   10/26/23 1400  ceFAZolin (ANCEF) IVPB 2g/100 mL premix        2 g 200 mL/hr over 30 Minutes Intravenous On call to O.R. 10/26/23 1341 10/27/23 0559   10/26/23 1343  ceFAZolin (ANCEF) 2-4 GM/100ML-% IVPB       Note to Pharmacy: Sherren Kerns H: cabinet override      10/26/23 1343 10/27/23 0159       Assessment/Plan:  Patient is a 71 year old male who was admitted with gastric outlet obstruction.  -Plan for open gastrojejunostomy today -NPO, NG to LIS -Continue PPI -Hold heparin gtt.  Will plan to restart 4-6 hours after surgery -TPN per hospitalist -Appreciate GI and hospitalist recommendations   LOS: 3 days    Pawel Soules A Marks Scalera 10/26/2023

## 2023-10-26 NOTE — Progress Notes (Signed)
PHARMACY - ANTICOAGULATION Pharmacy Consult for heparin Indication:  VTE treatment   Allergies  Allergen Reactions   Asa [Aspirin] Other (See Comments)    Stomach pain   Crestor [Rosuvastatin] Other (See Comments)    Stomach pain   Morphine Nausea And Vomiting   Wellbutrin [Bupropion] Other (See Comments)    Stomach pain   Zoloft [Sertraline Hcl] Other (See Comments)    Stomach pain    Patient Measurements: Height: 6' (182.9 cm) Weight: 89.5 kg (197 lb 5 oz) IBW/kg (Calculated) : 77.6 Heparin Dosing Weight: 88 kg  Vital Signs: Temp: 98.3 F (36.8 C) (11/08 1804) Temp Source: Axillary (11/08 1804) BP: 162/97 (11/08 1730) Pulse Rate: 96 (11/08 1804)  Labs: Recent Labs    10/24/23 0002 10/24/23 0852 10/25/23 0441 10/25/23 0506 10/25/23 1355 10/26/23 0619 10/26/23 1351  HGB 9.9*  --  10.7*  --   --  10.0* 12.2*  HCT 31.2*  --  35.3*  --   --  32.8* 36.0*  PLT 279  --  257  --   --  275  --   HEPARINUNFRC 0.26*  --   --  0.48 0.63 0.10*  --   CREATININE  --    < > 1.26*  --   --  1.28* 1.40*   < > = values in this interval not displayed.    Estimated Creatinine Clearance: 51.6 mL/min (A) (by C-G formula based on SCr of 1.4 mg/dL (H)).  Assessment: 72 y.o. male with possible PE for heparin.  Ultrasound negative for portal vein thrombosis.   HL 0.63- therapeutic  Hgb 10.7  Goal of Therapy:  Heparin level 0.3-0.7 units/ml Monitor platelets by anticoagulation protocol: Yes   Plan:  Per conversation with MD, will restart heparin drip at 2300 at previous rate of 1600 units/hour Heparin level in 8 hours Continue to monitor CBC daily  Paulita Fujita, PharmD Clinical Pharmacist 10/26/2023 6:51 PM

## 2023-10-26 NOTE — Progress Notes (Signed)
[facility]  Spoke with the patient's daughter on the phone.  I explained that he tolerated the procedure without difficulty.  He has skin staples at his midline incision site.  He will have his NG tube in place until Monday.  On Monday, we will perform an upper GI study, which will evaluate for any leak.  If there is no leak present, he can have his NG tube removed and a diet initiated.  All questions were answered to her expressed satisfaction.  Plan: -Return to room on the floor -Maintain NG tube to LIS, NPO -PRN pain control and antiemetics -Plan for upper GI study on Monday -Appreciate hospitalist and GI recommendations  Theophilus Kinds, DO [facility] [address] [place], [place] [address] [phone] (office)

## 2023-10-26 NOTE — Progress Notes (Signed)
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  Patient should follow in the GI clinic after hospitalization.  He should completely abstain from NSAIDs or high-dose aspirin components.  He should continue with PPI twice daily.  Patient already has an appointment on 11/27/2023.  GI service will sign-off, please call us back if you have any more questions.

## 2023-10-26 NOTE — Transfer of Care (Signed)
Immediate Anesthesia Transfer of Care Note  Patient: Matthew Molina  Procedure(s) Performed: GASTROJEJUNOSTOMY  Patient Location: PACU  Anesthesia Type:General  Level of Consciousness: awake, drowsy, and patient cooperative  Airway & Oxygen Therapy: Patient Spontanous Breathing and Patient connected to nasal cannula oxygen  Post-op Assessment: Report given to RN, Post -op Vital signs reviewed and stable, and Patient moving all extremities X 4  Post vital signs: Reviewed and stable  Last Vitals:  Vitals Value Taken Time  BP 140/98 10/26/23 1702  Temp    Pulse 92 10/26/23 1703  Resp 19 10/26/23 1703  SpO2 97 % 10/26/23 1703  Vitals shown include unfiled device data.  Last Pain:  Vitals:   10/26/23 1333  TempSrc: Oral  PainSc: 7       Patients Stated Pain Goal: 2 (10/23/23 1237)  Complications: No notable events documented.

## 2023-10-26 NOTE — Anesthesia Preprocedure Evaluation (Signed)
Anesthesia Evaluation  Patient identified by MRN, date of birth, ID band Patient awake    Reviewed: Allergy & Precautions, NPO status , Patient's Chart, lab work & pertinent test results, reviewed documented beta blocker date and time   Airway Mallampati: I  TM Distance: >3 FB Neck ROM: Full    Dental  (+) Edentulous Upper, Edentulous Lower   Pulmonary shortness of breath, COPD,  COPD inhaler, Current Smoker and Patient abstained from smoking. IMPRESSION: 1. Lung-RADS 2, benign appearance or behavior. Continue annual screening with low-dose chest CT without contrast in 12 months. 2. Stable mildly dilated ascending thoracic aorta, measuring up 4.3 cm. Recommend continued attention on annual lung cancer screening CT. 3. Severe left main and three-vessel coronary artery calcifications. 4. Aortic Atherosclerosis (ICD10-I70.0) and Emphysema (ICD10-J43.9).   Electronically Signed: By: Allegra Lai M.D. On: 03/06/2023 17:07      Pulmonary exam normal breath sounds clear to auscultation       Cardiovascular Exercise Tolerance: Poor hypertension, Pt. on medications and Pt. on home beta blockers + CAD and + DOE  Normal cardiovascular exam Rhythm:Regular Rate:Normal  22-Apr-2023 16:25:14 [facility] ROUTINE RECORD 1951/07/22 (72 yr) Male Black Vent. rate 90 BPM PR interval 182 ms QRS duration 97 ms QT/QTcB 378/463 ms P-R-T axes -17 -68 6 Sinus rhythm Left anterior fascicular block RSR' in V1 or V2, probably normal variant Left ventricular hypertrophy Anterior Q waves, possibly due to LVH Baseline wander in lead(s) V5 Confirmed by Vivi Barrack [phone]) on 04/22/2023 5:09:32 PM   Neuro/Psych  PSYCHIATRIC DISORDERS Anxiety Depression  Schizophrenia     GI/Hepatic PUD,GERD  Medicated and Controlled,,  Endo/Other  diabetes, Well Controlled, Type 2, Oral Hypoglycemic Agents    Renal/GU Renal  InsufficiencyRenal disease Bladder dysfunction (BPH)      Musculoskeletal  (+) Arthritis , Osteoarthritis,    Abdominal   Peds  Hematology  (+) Blood dyscrasia, anemia   Anesthesia Other Findings   Reproductive/Obstetrics                             Anesthesia Physical Anesthesia Plan  ASA: 4 and emergent  Anesthesia Plan: General and General ETT   Post-op Pain Management: Minimal or no pain anticipated   Induction: Intravenous  PONV Risk Score and Plan: 1 and Ondansetron  Airway Management Planned: Nasal Cannula and Natural Airway  Additional Equipment:   Intra-op Plan:   Post-operative Plan:   Informed Consent: I have reviewed the patients History and Physical, chart, labs and discussed the procedure including the risks, benefits and alternatives for the proposed anesthesia with the patient or authorized representative who has indicated his/her understanding and acceptance.       Plan Discussed with: CRNA and Surgeon  Anesthesia Plan Comments:        Anesthesia Quick Evaluation

## 2023-10-26 NOTE — Progress Notes (Signed)
PHARMACY - TOTAL PARENTERAL NUTRITION CONSULT NOTE   Indication:  Gastric outlet obstruction  Patient Measurements: Height: 6' (182.9 cm) Weight: 89.5 kg (197 lb 5 oz) IBW/kg (Calculated) : 77.6 TPN AdjBW (KG): 88 Body mass index is 26.76 kg/m.   Assessment: Patient with gastric outlet obstruction that  is preventing him from passing any food or drink and resulting with his abdominal distention which is causing pain, nausea, and vomiting. Since this area of his duodenum has been inflamed for such a significant period of time and has caused secondary complications=> Plan is for creation of a gastrojejunostomy to bypass this gastric outlet obstruction.   Glucose / Insulin: BS 91 Electrolytes: K 2.5 ( IV x 4 runs ordered) Phos 2.4 Mag 1.7 (4gm IV 11/8) Renal:  Hepatic:  Intake / Output; MIVF: MIVF stops 11/8 at 1800 GI Imaging:EGD 11/6 showed distal duodenal obstruction.  GI Surgeries / Procedures:   Central access: 11/8 PICC line TPN start date: 11/8  Nutritional Goals: Goal TPN rate is 84 mL/hr (provides 161 g of protein and 1810 kcals per day)  RD Assessment: Estimated Needs Total Energy Estimated Needs: 2300-2600 Total Protein Estimated Needs: 120-135 gm Total Fluid Estimated Needs: 2.3-2.6 L  Current Nutrition:  NPO  Plan:  Start TPN at 59mL/hr at 1800 + lipids 20% infused over 12 hours daily Electrolytes in TPN: Na 35 mEq/L, K 30 mEq/L, Ca 4.63mEq/L, Mg 9mEq/L, and Phos 53mmol/L. Cl:Ac 1:1 Add standard MVI and trace elements to TPN Initiate Sensitive q8h SSI and adjust as needed  Monitor TPN labs on Mon/Thurs, tomorrow AM  Elder Cyphers, BS Pharm D, BCPS Clinical Pharmacist 10/26/2023,11:53 AM

## 2023-10-26 NOTE — Progress Notes (Addendum)
PROGRESS NOTE   THEADORE Molina  JYN:[id number] DOB: Aug 22, 1951 DOA: 10/23/2023 PCP: Kerri Perches, MD   No chief complaint on file.  Level of care: ICU  Brief Admission History:  72 year old male with a history of IgG MGUS, COPD, diabetes mellitus type 2, hypertension, hyperlipidemia, depression, tobacco abuse, coronary disease presenting with generalized weakness, abdominal pain, nausea and vomiting. The patient was recently admitted to the hospital from 09/30/2023 to 10/03/2023.  At that time, the patient had nausea and vomiting and poor oral intake and upper abdominal pain as well.  The patient was treated for sepsis secondary to aspiration pneumonia.  GI was consulted during the admission.  The patient underwent EGD on 10/02/2023 which showed grade B esophagitis without bleeding, nonbleeding gastric ulcers with a clean base, and duodenitis.  The patient was discharged home with PPI twice daily and sulcrafate.  The patient states that he was feeling well after d/c and he was tolerating a soft diet. However, he but began having gradual abdominal pain and belching in the past 1 week.  He states that things have gradually worsened over the past 3 days prior to admission with increasing belching and hiccups that has resulted in vomiting every time he belches.  He has had poor oral intake and progressive generalized weakness.  He also complains of worsening mid abdominal pain.  He denies f/c, cp, sob, hemoptysis, diarrhea, hematochezia, melena, dysuria, hematuria.  As result of abd pain with n/v, the patient presented for further evaluation and treatment.  He continues to smoke a few cigarette's a day. Notably, patient states he ate spaghetti and chocolate chip cookies before he came to ED and did not have any emesis.   In the ED, the patient was afebrile hemodynamically stable with oxygen saturation 92% on room air.  WBC 6.0, hemoglobin 11.1, platelets 332.  Sodium 132, potassium 3.1, bicarbonate  28, serum creatinine 1.53.  LFTs were unremarkable.  Lipase 84.  CT of the abdomen and pelvis showed marked distention of the stomach to the level of the duodenum where there is circumferential thickening and heterogenous hypoattenuation at the site of the patient's previous duodenitis. There was a new hypodensity upstream portal vein suspicious for nonocclusive thrombus.  There is also a partially imaged hypodensity in the right upper lobe pulmonary artery which may represent artifact versus PE. The patient was started on IV heparin. -GI was consulted to assist with management.   Assessment and Plan:  Severe distal duodenal obstruction  - discussed with GI, he was kept intubated post EGD overnight, orders placed to EXTUBATE this AM   - GI recommends CTA abdomen completed to rule out ischemic process, consult general surgery for gastrojejunostomy for drainage  - NG tube in place with significant output - continue pantoprazole  - surgery planning for gastrojejunostomy on 10/26/23  - pharm D consulted to start TPN - PICC line requested to be placed  - further recommendations to follow   Question of portal venous thrombosis Question of PE - CTA chest/abd/pelvis unfortunately not able to evaluate for PE given timing of contrast bolus given  - liver doppler without evidence of portal vein thrombosis  - IV heparin infusion on hold for surgery today - D dimer markedly elevated at 3.65 so we presume he does have a PE - check venous dopplers of legs to assess for DVT  - check TTE to evaluate for right heart strain   CKD stage IIIa -Baseline creatinine 1.2-1.5 -Monitor daily BMP   COPD -  Stable on room air -Restart BDs -Continue Singulair when able to tolerate p.o.   Tobacco abuse -Tobacco cessation discussed   Depression/anxiety -restart risperdal and buspar when able to tolerate po   Controlled diabetes mellitus type 2 -10/07/2023 hemoglobin A1c 6.0   BPH -Restart tamsulosin when  able to tolerate p.o   DVT prophylaxis:  SCDs Code Status: FULL  Family Communication:  Disposition: transferred to ICU    Consultants:  GI   Procedures:  EGD 10/24/23   Antimicrobials:    Subjective: Pt agreeable to surgery today.  No specific complaints.   Objective: Vitals:   10/26/23 0700 10/26/23 0738 10/26/23 0800 10/26/23 0835  BP: (!) 151/90  (!) 156/88   Pulse: 83  77   Resp:   (!) 22   Temp:  98.6 F (37 C)    TempSrc:  Oral    SpO2: 93%  94% 94%  Weight:      Height:        Intake/Output Summary (Last 24 hours) at 10/26/2023 0956 Last data filed at 10/26/2023 0834 Gross per 24 hour  Intake 1087.41 ml  Output 3400 ml  Net -2312.59 ml   Filed Weights   10/23/23 0909 10/24/23 1611 10/26/23 0500  Weight: 88 kg 88 kg 89.5 kg   Examination:  General exam: critically ill appearing, extubated.  NG tube in place.   Respiratory system: BBS heard. No rales. Cardiovascular system: normal S1 & S2 heard. No JVD, murmurs, rubs, gallops or clicks. No pedal edema. Gastrointestinal system: Abdomen is distended, soft. No organomegaly or masses felt. Normal bowel sounds heard. Central nervous system: sedated.  No focal neurological deficits. Extremities: Symmetric 5 x 5 power. Skin: No rashes, lesions or ulcers. Psychiatry: Judgement and insight UTD   Data Reviewed: I have personally reviewed following labs and imaging studies  CBC: Recent Labs  Lab 10/23/23 1000 10/24/23 0002 10/25/23 0441 10/26/23 0619  WBC 6.0 5.9 5.4 5.5  NEUTROABS 4.3  --   --   --   HGB 11.1* 9.9* 10.7* 10.0*  HCT 35.2* 31.2* 35.3* 32.8*  MCV 89.6 89.4 93.4 89.1  PLT 332 279 257 275    Basic Metabolic Panel: Recent Labs  Lab 10/23/23 1000 10/24/23 0852 10/25/23 0441 10/26/23 0619  NA 132* 134* 136 136  K 3.1* 2.8* 3.5 2.5*  CL 91* 96* 101 99  CO2 26 26 19* 27  GLUCOSE 151* 80 109* 91  BUN 20 18 20 17   CREATININE 1.53* 1.24 1.26* 1.28*  CALCIUM 10.3 9.3 9.6 9.8  MG  --   1.7 1.9 1.7  PHOS  --   --   --  2.4*    CBG: Recent Labs  Lab 10/25/23 0104 10/25/23 0606 10/25/23 1534 10/26/23 0608 10/26/23 0737  GLUCAP 129* 112* 79 70 100*    Recent Results (from the past 240 hour(s))  MRSA Next Gen by PCR, Nasal     Status: None   Collection Time: 10/24/23  4:00 PM   Specimen: Nasal Mucosa; Nasal Swab  Result Value Ref Range Status   MRSA by PCR Next Gen NOT DETECTED NOT DETECTED Final    Comment: (NOTE) The GeneXpert MRSA Assay (FDA approved for NASAL specimens only), is one component of a comprehensive MRSA colonization surveillance program. It is not intended to diagnose MRSA infection nor to guide or monitor treatment for MRSA infections. Test performance is not FDA approved in patients less than 60 years old. Performed at [facility], [address], [place], [place] [address]      Radiology Studies: DG Abd Portable 1V  Result Date: 10/25/2023 CLINICAL DATA:  NG placement EXAM: PORTABLE ABDOMEN - 1 VIEW COMPARISON:  Abdominal radiograph dated 10/25/2023. FINDINGS: Interval progression of the enteric tube with tip and side-port in the left upper abdomen likely in the proximal stomach. IMPRESSION: Enteric tube with tip and side-port in the proximal stomach. Electronically Signed   By: Elgie Collard M.D.   On: 10/25/2023 18:15   [place] EKG SITE RITE  Result Date: 10/25/2023 If Site Rite image not attached, placement could not be confirmed due to current cardiac rhythm.  DG Abd Portable 1V  Result Date: 10/25/2023 CLINICAL DATA:  NG tube placement EXAM: PORTABLE ABDOMEN - 1 VIEW COMPARISON:  KUB 2 days prior, CT abdomen/pelvis 1 day prior FINDINGS: The enteric catheter tip is in the stomach and sidehole is in the distal esophagus. There is a nonobstructive bowel gas pattern. There is no definite free intraperitoneal air. There is no abnormal soft tissue calcification. Contrast is noted in the bladder. There is no acute osseous abnormality.  IMPRESSION: Enteric catheter sidehole in the distal esophagus. Recommend advancement by approximately 8 cm. These results will be called to the ordering clinician or representative by the Radiologist Assistant, and communication documented in the PACS or [employer]. Electronically Signed   By: Lesia Hausen M.D.   On: 10/25/2023 13:34   DG CHEST PORT 1 VIEW  Result Date: 10/24/2023 CLINICAL DATA:  Intubation. EXAM: PORTABLE CHEST 1 VIEW COMPARISON:  10/23/2023. FINDINGS: The heart size and mediastinal contours are stable. There is atherosclerotic calcification of the aorta. Mild airspace disease is noted at the left lung base. No effusion or pneumothorax is seen. An endotracheal tube terminates 4.4 cm above the carina. The side port of an enteric tube is above the level of the diaphragm and should be advanced 14 cm. IMPRESSION: 1. Mild airspace disease at the left lung base, likely atelectasis. 2. Endotracheal tube terminates 4.4 cm above the carina. 3. The enteric tube terminates above the level of the diaphragm and should be advanced 14 cm. Electronically Signed   By: Thornell Sartorius M.D.   On: 10/24/2023 20:14   CT Angio Chest/Abd/Pel for Dissection W and/or W/WO  Result Date: 10/24/2023 CLINICAL DATA:  Arterial embolism suspected, non-extremity, determine source rule out chest PE, rule out GI ischemia per Dr. Jena Gauss EXAM: CT ANGIOGRAPHY CHEST, ABDOMEN AND PELVIS TECHNIQUE: Non-contrast CT of the chest was initially obtained. Multidetector CT imaging through the chest, abdomen and pelvis was performed using the standard protocol during bolus administration of intravenous contrast. Multiplanar reconstructed images and MIPs were obtained and reviewed to evaluate the vascular anatomy. RADIATION DOSE REDUCTION: This exam was performed according to the departmental dose-optimization program which includes automated exposure control, adjustment of the mA and/or kV according to patient size and/or use of  iterative reconstruction technique. CONTRAST:  75mL OMNIPAQUE IOHEXOL 350 MG/ML SOLN COMPARISON:  10/23/2023, 09/30/2023 FINDINGS: CTA CHEST FINDINGS Cardiovascular: Heart is borderline in size. Scattered coronary artery and aortic calcifications. 4 cm ascending thoracic aortic aneurysm. No dissection. Pulmonary arteries are not adequately opacified to evaluate for pulmonary emboli on this aortic/dissection study. Mediastinum/Nodes: No mediastinal, hilar, or axillary adenopathy. Endotracheal tube in the mid to lower trachea. Thyroid and esophagus unremarkable. Lungs/Pleura: Small left pleural effusion, slightly increased since prior study. Increasing left basilar atelectasis or infiltrate. Musculoskeletal: Chest wall soft tissues are unremarkable. No acute bony abnormality. Review of the MIP images  confirms the above findings. CTA ABDOMEN AND PELVIS FINDINGS VASCULAR Aorta: Aortic atherosclerosis.  No aneurysm or dissection. Celiac: Patent without evidence of aneurysm, dissection, vasculitis or significant stenosis. SMA: Patent without evidence of aneurysm, dissection, vasculitis or significant stenosis. Renals: Single left renal artery and 2 right renal arteries. No evidence of stenosis. Scattered calcifications. IMA: Patent Inflow: Moderate calcifications through the iliofemoral vessels. No aneurysm, dissection or significant stenosis. Veins: No obvious venous abnormality within the limitations of this arterial phase study. Review of the MIP images confirms the above findings. NON-VASCULAR Hepatobiliary: No focal hepatic abnormality. Gallbladder unremarkable. Pancreas: No focal abnormality or ductal dilatation. Spleen: No focal abnormality.  Normal size. Adrenals/Urinary Tract: No adrenal nodules. Bilateral renal cysts appear benign with no additional follow-up recommended. No stones or hydronephrosis. Small layering mobile stones in the left side of the urinary bladder. Stomach/Bowel: Marked distention of the  stomach again noted. Duodenal bulb is distended with fluid as well. There appears to be circumferential wall thickening within the descending duodenum with surrounding inflammation/stranding. Overall, this appears slightly worsened since prior study. Remainder of small bowel decompressed. Colonic diverticulosis. No active diverticulitis. Lymphatic: No adenopathy Reproductive: Prostate enlargement. Other: No free fluid or free air. Musculoskeletal: No acute bony abnormality. Review of the MIP images confirms the above findings. IMPRESSION: No evidence of aortic aneurysm or dissection. Aortic atherosclerosis. Mesenteric vessels appear widely patent. Unable to assess/evaluate for pulmonary emboli given the bolus timing to adequately opacify and evaluate the aorta. Coronary artery disease. Small left pleural effusion with left basilar atelectasis or infiltrate, increasing since prior study. Marked distention of the stomach with fluid and air. This appears to be due to wall thickening and surrounding inflammation in the region of the descending duodenum. This could be related to severe duodenitis or possibly duodenal wall hematoma. Findings concerning for gastric outlet obstruction. The patient may benefit from NG tube decompression. Electronically Signed   By: Charlett Nose M.D.   On: 10/24/2023 19:22    Scheduled Meds:  arformoterol  15 mcg Nebulization BID   budesonide (PULMICORT) nebulizer solution  0.5 mg Nebulization BID   busPIRone  7.5 mg Per Tube TID   Chlorhexidine Gluconate Cloth  6 each Topical Daily   docusate  100 mg Per Tube BID   doxazosin  1 mg Per Tube q1800   insulin aspart  0-15 Units Subcutaneous Q6H   ipratropium-albuterol  3 mL Nebulization TID   metoprolol tartrate  2.5 mg Intravenous Q6H   montelukast  10 mg Per Tube QHS   pantoprazole (PROTONIX) IV  40 mg Intravenous Q12H   polyethylene glycol  17 g Per Tube Daily   risperiDONE  1 mg Per Tube QHS   sucralfate  1 g Per Tube TID WC  & HS   Continuous Infusions:  dextrose Stopped (10/26/23 0747)   potassium chloride 10 mEq (10/26/23 0834)     LOS: 3 days   Critical Care Procedure Note Authorized and Performed by: Maryln Manuel MD  Total Critical Care time:  56 mins Due to a high probability of clinically significant, life threatening deterioration, the patient required my highest level of preparedness to intervene emergently and I personally spent this critical care time directly and personally managing the patient.  This critical care time included obtaining a history; examining the patient, pulse oximetry; ordering and review of studies; arranging urgent treatment with development of a management plan; evaluation of patient's response of treatment; frequent reassessment; and discussions with other providers.  This critical  care time was performed to assess and manage the high probability of imminent and life threatening deterioration that could result in multi-organ failure.  It was exclusive of separately billable procedures and treating other patients and teaching time.   Standley Dakins, MD How to contact the [facility] Attending or Consulting provider 7A - 7P or covering provider during after hours 7P -7A, for this patient?  Check the care team in [facility] and look for a) attending/consulting [facility] provider listed and b) the [facility] team listed Log into [email or web address] to find provider on call.  Locate the [facility] provider you are looking for under [facility] and page to a number that you can be directly reached. If you still have difficulty reaching the provider, please page the [facility] (Director on Call) for the Hospitalists listed on amion for assistance.  10/26/2023, 9:56 AM

## 2023-10-26 NOTE — Brief Op Note (Signed)
10/23/2023 - 10/26/2023  4:59 PM  PATIENT:  [name]  72 y.o. male  PRE-OPERATIVE DIAGNOSIS:  Gastric outlet obstruction  POST-OPERATIVE DIAGNOSIS:  Gastric outlet obstruction  PROCEDURE:  Procedure(s): GASTROJEJUNOSTOMY (N/A)  SURGEON:  Surgeons and Role:    * Andrewjames Weirauch, Gustavus Messing, DO - Primary  PHYSICIAN ASSISTANT:  Franky Macho, MD  ASSISTANTS: none   ANESTHESIA:   general  EBL:  10 cc  BLOOD ADMINISTERED:none  DRAINS: none   LOCAL MEDICATIONS USED:  MARCAINE     SPECIMEN:  No Specimen  DISPOSITION OF SPECIMEN:  N/A  COUNTS:  YES  DICTATION: .Note written in EPIC  PLAN OF CARE: Admit to inpatient   PATIENT DISPOSITION:  PACU - hemodynamically stable.   Delay start of Pharmacological VTE agent (>24hrs) due to surgical blood loss or risk of bleeding: no  Theophilus Kinds, DO [facility] [address] [place] [place], [place] [address] [phone] (office)

## 2023-10-26 NOTE — Progress Notes (Signed)
Peripherally Inserted Central Catheter Placement  The IV Nurse has discussed with the patient and/or persons authorized to consent for the patient, the purpose of this procedure and the potential benefits and risks involved with this procedure.  The benefits include less needle sticks, lab draws from the catheter, and the patient may be discharged home with the catheter. Risks include, but not limited to, infection, bleeding, blood clot (thrombus formation), and puncture of an artery; nerve damage and irregular heartbeat and possibility to perform a PICC exchange if needed/ordered by physician.  Alternatives to this procedure were also discussed.  Bard Power PICC patient education guide, fact sheet on infection prevention and patient information card has been provided to patient /or left at bedside.    PICC Placement Documentation  PICC Triple Lumen 10/26/23 Right Basilic 39 cm 0 cm (Active)  Indication for Insertion or Continuance of Line Administration of hyperosmolar/irritating solutions (i.e. TPN, Vancomycin, etc.) 10/26/23 1830  Exposed Catheter (cm) 0 cm 10/26/23 1830  Site Assessment Clean, Dry, Intact 10/26/23 1830  Lumen #1 Status Flushed;Saline locked;Blood return noted 10/26/23 1830  Lumen #2 Status Flushed;Saline locked;Blood return noted 10/26/23 1830  Lumen #3 Status Flushed;Saline locked;Blood return noted 10/26/23 1830  Dressing Type Transparent;Securing device 10/26/23 1830  Dressing Status Antimicrobial disc in place;Clean, Dry, Intact 10/26/23 1830  Line Care Connections checked and tightened 10/26/23 1830  Line Adjustment (NICU/IV Team Only) No 10/26/23 1830  Dressing Intervention New dressing;Adhesive placed at insertion site (IV team only);Other (Comment) 10/26/23 1830  Dressing Change Due 11/02/23 10/26/23 1830       Annett Fabian 10/26/2023, 6:31 PM

## 2023-10-26 NOTE — Care Management Important Message (Signed)
Important Message  Patient Details  Name: Matthew Molina MRN: [id number] Date of Birth: Apr 23, 1951   Important Message Given:        Corey Harold 10/26/2023, 4:46 PM

## 2023-10-26 NOTE — Anesthesia Procedure Notes (Signed)
Procedure Name: Intubation Date/Time: 10/26/2023 3:56 PM  Performed by: Lendon Ka, CRNAPre-anesthesia Checklist: Patient identified, Emergency Drugs available, Suction available and Patient being monitored Patient Re-evaluated:Patient Re-evaluated prior to induction Oxygen Delivery Method: Circle System Utilized Preoxygenation: Pre-oxygenation with 100% oxygen Induction Type: IV induction, Rapid sequence and Cricoid Pressure applied Laryngoscope Size: Miller and 2 Grade View: Grade I Tube type: Oral Tube size: 7.5 mm Number of attempts: 1 Airway Equipment and Method: Stylet Placement Confirmation: ETT inserted through vocal cords under direct vision, positive ETCO2 and breath sounds checked- equal and bilateral Secured at: 23 cm Tube secured with: Tape Dental Injury: Teeth and Oropharynx as per pre-operative assessment  Comments: With ease.

## 2023-10-27 ENCOUNTER — Inpatient Hospital Stay (HOSPITAL_COMMUNITY): Payer: 59

## 2023-10-27 DIAGNOSIS — I10 Essential (primary) hypertension: Secondary | ICD-10-CM | POA: Diagnosis not present

## 2023-10-27 DIAGNOSIS — I81 Portal vein thrombosis: Secondary | ICD-10-CM | POA: Diagnosis not present

## 2023-10-27 DIAGNOSIS — J432 Centrilobular emphysema: Secondary | ICD-10-CM | POA: Diagnosis not present

## 2023-10-27 DIAGNOSIS — E43 Unspecified severe protein-calorie malnutrition: Secondary | ICD-10-CM

## 2023-10-27 DIAGNOSIS — K311 Adult hypertrophic pyloric stenosis: Secondary | ICD-10-CM | POA: Diagnosis not present

## 2023-10-27 DIAGNOSIS — K56609 Unspecified intestinal obstruction, unspecified as to partial versus complete obstruction: Secondary | ICD-10-CM

## 2023-10-27 LAB — COMPREHENSIVE METABOLIC PANEL
ALT: 13 U/L (ref 0–44)
AST: 13 U/L — ABNORMAL LOW (ref 15–41)
Albumin: 3.2 g/dL — ABNORMAL LOW (ref 3.5–5.0)
Alkaline Phosphatase: 32 U/L — ABNORMAL LOW (ref 38–126)
Anion gap: 10 (ref 5–15)
BUN: 19 mg/dL (ref 8–23)
CO2: 23 mmol/L (ref 22–32)
Calcium: 9.2 mg/dL (ref 8.9–10.3)
Chloride: 100 mmol/L (ref 98–111)
Creatinine, Ser: 1.14 mg/dL (ref 0.61–1.24)
GFR, Estimated: >60 mL/min (ref >60)
Glucose, Bld: 199 mg/dL — ABNORMAL HIGH (ref 70–99)
Potassium: 2.6 mmol/L — CL (ref 3.5–5.1)
Sodium: 133 mmol/L — ABNORMAL LOW (ref 135–145)
Total Bilirubin: 0.8 mg/dL (ref <1.2)
Total Protein: 6.4 g/dL — ABNORMAL LOW (ref 6.5–8.1)

## 2023-10-27 LAB — ECHOCARDIOGRAM COMPLETE
AR max vel: 3.14 cm2
AV Area VTI: 2.87 cm2
AV Area mean vel: 2.99 cm2
AV Mean grad: 3 mm[Hg]
AV Peak grad: 5.9 mm[Hg]
Ao pk vel: 1.21 m/s
Area-P 1/2: 3.77 cm2
Est EF: >75
Height: 72 in
S' Lateral: 2.1 cm
Weight: 3008.84 [oz_av]

## 2023-10-27 LAB — HEPARIN LEVEL (UNFRACTIONATED): Heparin Unfractionated: 0.33 [IU]/mL (ref 0.30–0.70)

## 2023-10-27 LAB — GLUCOSE, CAPILLARY
Glucose-Capillary: 192 mg/dL — ABNORMAL HIGH (ref 70–99)
Glucose-Capillary: 192 mg/dL — ABNORMAL HIGH (ref 70–99)
Glucose-Capillary: 197 mg/dL — ABNORMAL HIGH (ref 70–99)

## 2023-10-27 LAB — MAGNESIUM: Magnesium: 2.1 mg/dL (ref 1.7–2.4)

## 2023-10-27 LAB — PHOSPHORUS: Phosphorus: 2.7 mg/dL (ref 2.5–4.6)

## 2023-10-27 LAB — CBC
HCT: 34.5 % — ABNORMAL LOW (ref 39.0–52.0)
Hemoglobin: 10.8 g/dL — ABNORMAL LOW (ref 13.0–17.0)
MCH: 28.2 pg (ref 26.0–34.0)
MCHC: 31.3 g/dL (ref 30.0–36.0)
MCV: 90.1 fL (ref 80.0–100.0)
Platelets: 284 10*3/uL (ref 150–400)
RBC: 3.83 MIL/uL — ABNORMAL LOW (ref 4.22–5.81)
RDW: 17 % — ABNORMAL HIGH (ref 11.5–15.5)
WBC: 11.6 10*3/uL — ABNORMAL HIGH (ref 4.0–10.5)
nRBC: 0 % (ref 0.0–0.2)

## 2023-10-27 MED ORDER — POTASSIUM CHLORIDE 10 MEQ/100ML IV SOLN
10.0000 meq | INTRAVENOUS | Status: AC
  Administered 2023-10-27 (×4): 10 meq via INTRAVENOUS
  Filled 2023-10-27 (×4): qty 100

## 2023-10-27 MED ORDER — FAT EMUL FISH OIL/PLANT BASED 20% (SMOFLIPID)IV EMUL
250.0000 mL | INTRAVENOUS | Status: AC
Start: 2023-10-27 — End: 2023-10-28
  Administered 2023-10-27: 250 mL via INTRAVENOUS
  Filled 2023-10-27: qty 250

## 2023-10-27 MED ORDER — TRACE MINERALS CU-MN-SE-ZN 300-55-60-3000 MCG/ML IV SOLN
INTRAVENOUS | Status: AC
  Filled 2023-10-27 (×2): qty 2000

## 2023-10-27 MED ORDER — PERFLUTREN LIPID MICROSPHERE
1.0000 mL | INTRAVENOUS | Status: AC | PRN
  Administered 2023-10-27: 5 mL via INTRAVENOUS

## 2023-10-27 NOTE — Progress Notes (Signed)
Date and time results received: 10/27/23 0740 (use smartphrase ".now" to insert current time)  Test: K Critical Value: 2.6  Name of Provider Notified: Dr. Laural Benes  Orders Received? Or Actions Taken?:  Dr. Laural Benes made aware, pending orders

## 2023-10-27 NOTE — Anesthesia Postprocedure Evaluation (Signed)
Anesthesia Post Note  Patient: Matthew Molina  Procedure(s) Performed: ESOPHAGOGASTRODUODENOSCOPY (EGD) WITH PROPOFOL BIOPSY  Patient location during evaluation: Phase II Anesthesia Type: General Level of consciousness: awake Pain management: pain level controlled Vital Signs Assessment: post-procedure vital signs reviewed and stable Respiratory status: spontaneous breathing and respiratory function stable Cardiovascular status: blood pressure returned to baseline and stable Postop Assessment: no headache and no apparent nausea or vomiting Anesthetic complications: no Comments: Late entry   No notable events documented.   Last Vitals:  Vitals:   10/27/23 1000 10/27/23 1106  BP: 132/83   Pulse: (!) 103 100  Resp: (!) 25 (!) 26  Temp:    SpO2: 92% 93%    Last Pain:  Vitals:   10/27/23 1106  TempSrc:   PainSc: 6                  Windell Norfolk

## 2023-10-27 NOTE — Plan of Care (Signed)
  Problem: Education: Goal: Knowledge of General Education information will improve Description: Including pain rating scale, medication(s)/side effects and non-pharmacologic comfort measures Outcome: Not Progressing   Problem: Health Behavior/Discharge Planning: Goal: Ability to manage health-related needs will improve Outcome: Progressing   Problem: Clinical Measurements: Goal: Ability to maintain clinical measurements within normal limits will improve Outcome: Progressing Goal: Will remain free from infection Outcome: Progressing Goal: Diagnostic test results will improve Outcome: Progressing Goal: Respiratory complications will improve Outcome: Progressing Goal: Cardiovascular complication will be avoided Outcome: Progressing   Problem: Activity: Goal: Risk for activity intolerance will decrease Outcome: Not Progressing   Problem: Nutrition: Goal: Adequate nutrition will be maintained Outcome: Progressing   Problem: Coping: Goal: Level of anxiety will decrease Outcome: Progressing   Problem: Elimination: Goal: Will not experience complications related to bowel motility Outcome: Progressing Goal: Will not experience complications related to urinary retention Outcome: Progressing   Problem: Pain Management: Goal: General experience of comfort will improve Outcome: Not Progressing   Problem: Safety: Goal: Ability to remain free from injury will improve Outcome: Progressing   Problem: Skin Integrity: Goal: Risk for impaired skin integrity will decrease Outcome: Not Progressing

## 2023-10-27 NOTE — Progress Notes (Signed)
PHARMACY - ANTICOAGULATION Pharmacy Consult for heparin Indication:  VTE treatment   Allergies  Allergen Reactions   Asa [Aspirin] Other (See Comments)    Stomach pain   Crestor [Rosuvastatin] Other (See Comments)    Stomach pain   Morphine Nausea And Vomiting   Wellbutrin [Bupropion] Other (See Comments)    Stomach pain   Zoloft [Sertraline Hcl] Other (See Comments)    Stomach pain    Patient Measurements: Height: 6' (182.9 cm) Weight: 85.3 kg (188 lb 0.8 oz) IBW/kg (Calculated) : 77.6 Heparin Dosing Weight: 88 kg  Vital Signs: Temp: 98.9 F (37.2 C) (11/08 2359) Temp Source: Oral (11/08 2359) BP: 147/95 (11/09 0600) Pulse Rate: 94 (11/09 0600)  Labs: Recent Labs    10/25/23 0441 10/25/23 0506 10/25/23 1355 10/26/23 0619 10/26/23 1351 10/27/23 0513 10/27/23 0700  HGB 10.7*  --   --  10.0* 12.2* 10.8*  --   HCT 35.3*  --   --  32.8* 36.0* 34.5*  --   PLT 257  --   --  275  --  284  --   HEPARINUNFRC  --    < > 0.63 0.10*  --   --  0.33  CREATININE 1.26*  --   --  1.28* 1.40*  --   --    < > = values in this interval not displayed.    Estimated Creatinine Clearance: 51.6 mL/min (A) (by C-G formula based on SCr of 1.4 mg/dL (H)).  Assessment: 72 y.o. male with possible PE for heparin.  Ultrasound negative for portal vein thrombosis.   HL 0.33- therapeutic . Heparin restarted post procedure 11/8 Hgb 10.7> 12.2  Goal of Therapy:  Heparin level 0.3-0.7 units/ml Monitor platelets by anticoagulation protocol: Yes   Plan:  Continue heparin at 1600 units/hr.  Heparin level daily Continue to monitor CBC   Elder Cyphers, BS Pharm D, BCPS Clinical Pharmacist 10/27/2023 8:04 AM

## 2023-10-27 NOTE — Progress Notes (Signed)
PROGRESS NOTE   Matthew Molina  YNW:[id number] DOB: Jul 04, 1951 DOA: 10/23/2023 PCP: Kerri Perches, MD   No chief complaint on file.  Level of care: ICU  Brief Admission History:  72 year old male with a history of IgG MGUS, COPD, diabetes mellitus type 2, hypertension, hyperlipidemia, depression, tobacco abuse, coronary disease presenting with generalized weakness, abdominal pain, nausea and vomiting. The patient was recently admitted to the hospital from 09/30/2023 to 10/03/2023.  At that time, the patient had nausea and vomiting and poor oral intake and upper abdominal pain as well.  The patient was treated for sepsis secondary to aspiration pneumonia.  GI was consulted during the admission.  The patient underwent EGD on 10/02/2023 which showed grade B esophagitis without bleeding, nonbleeding gastric ulcers with a clean base, and duodenitis.  The patient was discharged home with PPI twice daily and sulcrafate.  The patient states that he was feeling well after d/c and he was tolerating a soft diet. However, he but began having gradual abdominal pain and belching in the past 1 week.  He states that things have gradually worsened over the past 3 days prior to admission with increasing belching and hiccups that has resulted in vomiting every time he belches.  He has had poor oral intake and progressive generalized weakness.  He also complains of worsening mid abdominal pain.  He denies f/c, cp, sob, hemoptysis, diarrhea, hematochezia, melena, dysuria, hematuria.  As result of abd pain with n/v, the patient presented for further evaluation and treatment.  He continues to smoke a few cigarette's a day. Notably, patient states he ate spaghetti and chocolate chip cookies before he came to ED and did not have any emesis.   In the ED, the patient was afebrile hemodynamically stable with oxygen saturation 92% on room air.  WBC 6.0, hemoglobin 11.1, platelets 332.  Sodium 132, potassium 3.1, bicarbonate  28, serum creatinine 1.53.  LFTs were unremarkable.  Lipase 84.  CT of the abdomen and pelvis showed marked distention of the stomach to the level of the duodenum where there is circumferential thickening and heterogenous hypoattenuation at the site of the patient's previous duodenitis. There was a new hypodensity upstream portal vein suspicious for nonocclusive thrombus.  There is also a partially imaged hypodensity in the right upper lobe pulmonary artery which may represent artifact versus PE. The patient was started on IV heparin. -GI was consulted to assist with management.   Assessment and Plan:  Severe distal duodenal obstruction   - GI recommended CTA abdomen completed to rule out ischemic process, consult general surgery for gastrojejunostomy for drainage  - NG tube in place - continue pantoprazole  - Postop s/p gastrojejunostomy on 10/26/23  - pharm D consulted to start TPN on 11/9 - PICC line placed  - further recommendations to follow   Question of portal venous thrombosis Question of PE - CTA chest/abd/pelvis unfortunately not able to evaluate for PE given timing of contrast bolus given  - liver doppler without evidence of portal vein thrombosis  - IV heparin infusion on hold for surgery today - D dimer markedly elevated at 3.65 so we presume he does have a PE - check venous dopplers of legs to assess for DVT  - check TTE to evaluate for right heart strain : Left ventricular ejection fraction, by estimation, is >75%. The left ventricle has normal function. There is moderate left ventricular hypertrophy. Left ventricular diastolic parameters were normal. This was a technically difficult study.    CKD  stage IIIa -Baseline creatinine 1.2-1.5 -Monitor daily BMP   COPD -Stable on room air -Restart BDs -Continue Singulair when able to tolerate p.o.   Tobacco abuse -Tobacco cessation discussed   Depression/anxiety -restart risperdal and buspar when able to tolerate po    Controlled diabetes mellitus type 2 -10/07/2023 hemoglobin A1c 6.0   BPH -Restart tamsulosin when able to tolerate p.o   DVT prophylaxis:  SCDs Code Status: FULL  Family Communication:  Disposition: transferred to ICU    Consultants:  GI   Procedures:  EGD 10/24/23   Antimicrobials:    Subjective: Pt agreeable to surgery today.  No specific complaints.   Objective: Vitals:   10/27/23 0900 10/27/23 1000 10/27/23 1106 10/27/23 1221  BP: (!) 142/84 132/83    Pulse: (!) 102 (!) 103 100   Resp: (!) 23 (!) 25 (!) 26   Temp:    98.2 F (36.8 C)  TempSrc:    Oral  SpO2:  92% 93%   Weight:      Height:        Intake/Output Summary (Last 24 hours) at 10/27/2023 1631 Last data filed at 10/27/2023 1519 Gross per 24 hour  Intake 2405.42 ml  Output 3650 ml  Net -1244.58 ml   Filed Weights   10/26/23 0500 10/26/23 1333 10/27/23 0500  Weight: 89.5 kg 89.5 kg 85.3 kg   Examination:  General exam: critically ill appearing, extubated.  NG tube in place.   Respiratory system: BBS heard. No rales. Cardiovascular system: normal S1 & S2 heard. No JVD, murmurs, rubs, gallops or clicks. No pedal edema. Gastrointestinal system: Abdomen is distended, soft. No organomegaly or masses felt. Normal bowel sounds heard. Central nervous system: sedated.  No focal neurological deficits. Extremities: Symmetric 5 x 5 power. Skin: No rashes, lesions or ulcers. Psychiatry: Judgement and insight UTD   Data Reviewed: I have personally reviewed following labs and imaging studies  CBC: Recent Labs  Lab 10/23/23 1000 10/24/23 0002 10/25/23 0441 10/26/23 0619 10/26/23 1351 10/27/23 0513  WBC 6.0 5.9 5.4 5.5  --  11.6*  NEUTROABS 4.3  --   --   --   --   --   HGB 11.1* 9.9* 10.7* 10.0* 12.2* 10.8*  HCT 35.2* 31.2* 35.3* 32.8* 36.0* 34.5*  MCV 89.6 89.4 93.4 89.1  --  90.1  PLT 332 279 257 275  --  284    Basic Metabolic Panel: Recent Labs  Lab 10/23/23 1000 10/24/23 0852  10/25/23 0441 10/26/23 0619 10/26/23 1351 10/27/23 0513  NA 132* 134* 136 136 137 133*  K 3.1* 2.8* 3.5 2.5* 3.7 2.6*  CL 91* 96* 101 99 97* 100  CO2 26 26 19* 27  --  23  GLUCOSE 151* 80 109* 91 97 199*  BUN 20 18 20 17 18 19   CREATININE 1.53* 1.24 1.26* 1.28* 1.40* 1.14  CALCIUM 10.3 9.3 9.6 9.8  --  9.2  MG  --  1.7 1.9 1.7  --  2.1  PHOS  --   --   --  2.4*  --  2.7    CBG: Recent Labs  Lab 10/26/23 1706 10/26/23 1802 10/26/23 2355 10/27/23 0619 10/27/23 1203  GLUCAP 111* 128* 189* 192* 192*    Recent Results (from the past 240 hour(s))  MRSA Next Gen by PCR, Nasal     Status: None   Collection Time: 10/24/23  4:00 PM   Specimen: Nasal Mucosa; Nasal Swab  Result Value Ref Range Status  MRSA by PCR Next Gen NOT DETECTED NOT DETECTED Final    Comment: (NOTE) The GeneXpert MRSA Assay (FDA approved for NASAL specimens only), is one component of a comprehensive MRSA colonization surveillance program. It is not intended to diagnose MRSA infection nor to guide or monitor treatment for MRSA infections. Test performance is not FDA approved in patients less than 32 years old. Performed at [facility], [address] [place], [place] [address]      Radiology Studies: ECHOCARDIOGRAM COMPLETE  Result Date: 10/27/2023    ECHOCARDIOGRAM REPORT   Patient Name:   BAYLE YARA Date of Exam: 10/27/2023 Medical Rec #:  [id number]      Height:       72.0 in Accession #:    [id number]     Weight:       188.1 lb Date of Birth:  Feb 08, 1951      BSA:          2.076 m Patient Age:    72 years       BP:           166/92 mmHg Patient Gender: M              HR:           90 bpm. [facility] Procedure: 2D Echo, Cardiac Doppler, Color Doppler and Intracardiac            Opacification Agent Indications:    Pulmonary embolus  History:        Patient has no prior history of Echocardiogram examinations.                 CKD3 and COPD; Risk Factors:Current Smoker, Hypertension and                  Dyslipidemia.  Sonographer:    Dondra Prader RVT RCS Referring Phys: [id number] CATHERINE A PAPPAYLIOU  Sonographer Comments: Technically challenging study due to limited acoustic windows, suboptimal subcostal window and suboptimal apical window. Image acquisition challenging due to respiratory motion, unable to breath hold and Image acquisition challenging due to COPD. IMPRESSIONS  1. Technically difficult study.  2. Left ventricular ejection fraction, by estimation, is >75%. The left ventricle has normal function. There is moderate left ventricular hypertrophy. Left ventricular diastolic parameters were normal.  3. Right ventricular is not well-seen. Right ventricular size grossly normal, systolic function visually preserved.  4. Right atrial size was visually normal.  5. The mitral valve is grossly normal. No evidence of mitral valve regurgitation. No evidence of mitral stenosis.  6. The aortic valve is tricuspid. Aortic valve regurgitation is not visualized. No aortic stenosis is present.  7. There is dilatation of the ascending aorta, measuring 41 mm.  8. The inferior vena cava is dilated in size with >50% respiratory variability, suggesting right atrial pressure of 8 mmHg. Comparison(s): No prior Echocardiogram. FINDINGS  Left Ventricle: Left ventricular ejection fraction, by estimation, is >75%. The left ventricle has normal function. Definity contrast agent was given IV to delineate the left ventricular endocardial borders. The left ventricular internal cavity size was  small. There is moderate left ventricular hypertrophy. Left ventricular diastolic parameters were normal. Right Ventricle: Right ventricular is not well-seen. Right ventricular size grossly normal, systolic function visually preserved. Left Atrium: Left atrial size was normal in size. Right Atrium: Right atrial size was visually normal. Pericardium: There is no evidence of pericardial effusion. Mitral Valve: The mitral valve is grossly  normal. No  evidence of mitral valve regurgitation. No evidence of mitral valve stenosis. Tricuspid Valve: The tricuspid valve is normal in structure. Tricuspid valve regurgitation is not demonstrated. No evidence of tricuspid stenosis. Aortic Valve: The aortic valve is tricuspid. Aortic valve regurgitation is not visualized. No aortic stenosis is present. Aortic valve mean gradient measures 3.0 mmHg. Aortic valve peak gradient measures 5.9 mmHg. Aortic valve area, by VTI measures 2.87 cm. Pulmonic Valve: The pulmonic valve was normal in structure. Pulmonic valve regurgitation is not visualized. No evidence of pulmonic stenosis. Aorta: The aortic root is normal in size and structure. There is dilatation of the ascending aorta, measuring 41 mm. Venous: The inferior vena cava is dilated in size with greater than 50% respiratory variability, suggesting right atrial pressure of 8 mmHg. IAS/Shunts: The interatrial septum was not well visualized.  LEFT VENTRICLE PLAX 2D LVIDd:         3.70 cm   Diastology LVIDs:         2.10 cm   LV e' medial:    3.57 cm/s LV PW:         1.60 cm   LV E/e' medial:  12.3 LV IVS:        1.30 cm   LV e' lateral:   4.14 cm/s LVOT diam:     2.10 cm   LV E/e' lateral: 10.6 LV SV:         53 LV SV Index:   25 LVOT Area:     3.46 cm  RIGHT VENTRICLE             IVC RV S prime:     11.40 cm/s  IVC diam: 2.10 cm LEFT ATRIUM             Index LA diam:        3.30 cm 1.59 cm/m LA Vol (A2C):   36.6 ml 17.63 ml/m LA Vol (A4C):   37.6 ml 18.12 ml/m LA Biplane Vol: 39.6 ml 19.08 ml/m  AORTIC VALVE                    PULMONIC VALVE AV Area (Vmax):    3.14 cm     PV Vmax:       0.70 m/s AV Area (Vmean):   2.99 cm     PV Peak grad:  2.0 mmHg AV Area (VTI):     2.87 cm AV Vmax:           121.00 cm/s AV Vmean:          77.033 cm/s AV VTI:            0.183 m AV Peak Grad:      5.9 mmHg AV Mean Grad:      3.0 mmHg LVOT Vmax:         109.67 cm/s LVOT Vmean:        66.533 cm/s LVOT VTI:          0.152 m  LVOT/AV VTI ratio: 0.83  AORTA Ao Root diam: 3.70 cm Ao Asc diam:  4.10 cm MITRAL VALVE MV Area (PHT): 3.77 cm     SHUNTS MV Decel Time: 201 msec     Systemic VTI:  0.15 m MV E velocity: 44.00 cm/s   Systemic Diam: 2.10 cm MV A velocity: 110.00 cm/s MV E/A ratio:  0.40 Sunit Tolia Electronically signed by Tessa Lerner Signature Date/Time: 10/27/2023/2:57:03 PM    Final    US Venous Img Lower Bilateral (  DVT)  Result Date: 10/27/2023 CLINICAL DATA:  Acute pulmonary embolus. Tobacco use. Hypoxia and hospital. Respiratory failure acute. History of diabetes, obesity, COPD. Concern for DVT. EXAM: BILATERAL LOWER EXTREMITY VENOUS DOPPLER ULTRASOUND TECHNIQUE: Gray-scale sonography with graded compression, as well as color Doppler and duplex ultrasound were performed to evaluate the lower extremity deep venous systems from the level of the common femoral vein and including the common femoral, femoral, profunda femoral, popliteal and calf veins including the posterior tibial, peroneal and gastrocnemius veins when visible. The superficial great saphenous vein was also interrogated. Spectral Doppler was utilized to evaluate flow at rest and with distal augmentation maneuvers in the common femoral, femoral and popliteal veins. COMPARISON:  None Available. FINDINGS: RIGHT LOWER EXTREMITY Common Femoral Vein: No evidence of thrombus. Normal compressibility, respiratory phasicity and response to augmentation. Saphenofemoral Junction: No evidence of thrombus. Normal compressibility and flow on color Doppler imaging. Profunda Femoral Vein: No evidence of thrombus. Normal compressibility and flow on color Doppler imaging. Femoral Vein: No evidence of thrombus. Normal compressibility, respiratory phasicity and response to augmentation. Popliteal Vein: No evidence of thrombus. Normal compressibility, respiratory phasicity and response to augmentation. Calf Veins: No evidence of thrombus. Normal compressibility and flow on color  Doppler imaging. Superficial Great Saphenous Vein: No evidence of thrombus. Normal compressibility. Venous Reflux:  None. Other Findings:  None. LEFT LOWER EXTREMITY Common Femoral Vein: No evidence of thrombus. Normal compressibility, respiratory phasicity and response to augmentation. Saphenofemoral Junction: No evidence of thrombus. Normal compressibility and flow on color Doppler imaging. Profunda Femoral Vein: No evidence of thrombus. Normal compressibility and flow on color Doppler imaging. Femoral Vein: No evidence of thrombus. Normal compressibility, respiratory phasicity and response to augmentation. Popliteal Vein: No evidence of thrombus. Normal compressibility, respiratory phasicity and response to augmentation. Calf Veins: No evidence of thrombus. Normal compressibility and flow on color Doppler imaging. Superficial Great Saphenous Vein: No evidence of thrombus. Normal compressibility. Venous Reflux:  None. Other Findings:  None. IMPRESSION: No evidence of deep venous thrombosis in either lower extremity. Electronically Signed   By: Tish Frederickson M.D.   On: 10/27/2023 01:41    Scheduled Meds:  acetaminophen  1,000 mg Per Tube Q6H   arformoterol  15 mcg Nebulization BID   budesonide (PULMICORT) nebulizer solution  0.5 mg Nebulization BID   busPIRone  7.5 mg Per Tube TID   Chlorhexidine Gluconate Cloth  6 each Topical Daily   docusate  100 mg Per Tube BID   doxazosin  1 mg Per Tube q1800   insulin aspart  0-15 Units Subcutaneous Q6H   ipratropium-albuterol  3 mL Nebulization TID   metoprolol tartrate  2.5 mg Intravenous Q6H   montelukast  10 mg Per Tube QHS   pantoprazole (PROTONIX) IV  40 mg Intravenous Q12H   polyethylene glycol  17 g Per Tube Daily   risperiDONE  1 mg Per Tube QHS   sodium chloride flush  10-40 mL Intracatheter Q12H   sucralfate  1 g Per Tube TID WC & HS   Continuous Infusions:  .TPN (CLINIMIX-E) Adult     And   fat emul(SMOFlipid)     heparin 1,600 Units/hr  (10/27/23 1436)   TPN (CLINIMIX-E) Adult 40 mL/hr at 10/27/23 1229     LOS: 4 days   Critical Care Procedure Note Authorized and Performed by: Maryln Manuel MD  Total Critical Care time:  63 mins Due to a high probability of clinically significant, life threatening deterioration, the patient required my highest level  of preparedness to intervene emergently and I personally spent this critical care time directly and personally managing the patient.  This critical care time included obtaining a history; examining the patient, pulse oximetry; ordering and review of studies; arranging urgent treatment with development of a management plan; evaluation of patient's response of treatment; frequent reassessment; and discussions with other providers.  This critical care time was performed to assess and manage the high probability of imminent and life threatening deterioration that could result in multi-organ failure.  It was exclusive of separately billable procedures and treating other patients and teaching time.   Standley Dakins, MD How to contact the [facility] Attending or Consulting provider 7A - 7P or covering provider during after hours 7P -7A, for this patient?  Check the care team in [facility] and look for a) attending/consulting [facility] provider listed and b) the [facility] team listed Log into [email or web address] to find provider on call.  Locate the [facility] provider you are looking for under [facility] and page to a number that you can be directly reached. If you still have difficulty reaching the provider, please page the [facility] (Director on Call) for the Hospitalists listed on amion for assistance.  10/27/2023, 4:31 PM

## 2023-10-27 NOTE — Progress Notes (Signed)
[facility] Progress Note  1 Day Post-Op  Subjective: Patient seen and examined.  He is resting comfortably in bed.  NG tube remains in place and has had 1600 cc of gastric contents in the last 24 hours.  He complains of upper abdominal pain, but denies nausea and vomiting.  Objective: Vital signs in last 24 hours: Temp:  [97.3 F (36.3 C)-98.9 F (37.2 C)] 97.3 F (36.3 C) (11/09 0818) Pulse Rate:  [79-119] 94 (11/09 0600) Resp:  [18-31] 20 (11/09 0600) BP: (115-165)/(79-109) 147/95 (11/09 0600) SpO2:  [88 %-100 %] 90 % (11/09 0600) Weight:  [85.3 kg-89.5 kg] 85.3 kg (11/09 0500) Last BM Date : 10/22/23  Intake/Output from previous day: 11/08 0701 - 11/09 0700 In: 2507.1 [I.V.:1894.4; IV Piggyback:612.7] Out: 3350 [Urine:950; Emesis/NG output:1850; Blood:50] Intake/Output this shift: Total I/O In: 10 [I.V.:10] Out: 750 [Emesis/NG output:750]  General appearance: alert, cooperative, and no distress GI: Abdomen soft, nondistended, no percussion tenderness, mild upper abdominal tenderness to palpation; no rigidity, guarding, rebound tenderness  Lab Results:  Recent Labs    10/26/23 0619 10/26/23 1351 10/27/23 0513  WBC 5.5  --  11.6*  HGB 10.0* 12.2* 10.8*  HCT 32.8* 36.0* 34.5*  PLT 275  --  284   BMET Recent Labs    10/26/23 0619 10/26/23 1351 10/27/23 0513  NA 136 137 133*  K 2.5* 3.7 2.6*  CL 99 97* 100  CO2 27  --  23  GLUCOSE 91 97 199*  BUN 17 18 19   CREATININE 1.28* 1.40* 1.14  CALCIUM 9.8  --  9.2   PT/INR No results for input(s): "LABPROT", "INR" in the last 72 hours.  Studies/Results: US Venous Img Lower Bilateral (DVT)  Result Date: 10/27/2023 CLINICAL DATA:  Acute pulmonary embolus. Tobacco use. Hypoxia and hospital. Respiratory failure acute. History of diabetes, obesity, COPD. Concern for DVT. EXAM: BILATERAL LOWER EXTREMITY VENOUS DOPPLER ULTRASOUND TECHNIQUE: Gray-scale sonography with graded compression, as well as  color Doppler and duplex ultrasound were performed to evaluate the lower extremity deep venous systems from the level of the common femoral vein and including the common femoral, femoral, profunda femoral, popliteal and calf veins including the posterior tibial, peroneal and gastrocnemius veins when visible. The superficial great saphenous vein was also interrogated. Spectral Doppler was utilized to evaluate flow at rest and with distal augmentation maneuvers in the common femoral, femoral and popliteal veins. COMPARISON:  None Available. FINDINGS: RIGHT LOWER EXTREMITY Common Femoral Vein: No evidence of thrombus. Normal compressibility, respiratory phasicity and response to augmentation. Saphenofemoral Junction: No evidence of thrombus. Normal compressibility and flow on color Doppler imaging. Profunda Femoral Vein: No evidence of thrombus. Normal compressibility and flow on color Doppler imaging. Femoral Vein: No evidence of thrombus. Normal compressibility, respiratory phasicity and response to augmentation. Popliteal Vein: No evidence of thrombus. Normal compressibility, respiratory phasicity and response to augmentation. Calf Veins: No evidence of thrombus. Normal compressibility and flow on color Doppler imaging. Superficial Great Saphenous Vein: No evidence of thrombus. Normal compressibility. Venous Reflux:  None. Other Findings:  None. LEFT LOWER EXTREMITY Common Femoral Vein: No evidence of thrombus. Normal compressibility, respiratory phasicity and response to augmentation. Saphenofemoral Junction: No evidence of thrombus. Normal compressibility and flow on color Doppler imaging. Profunda Femoral Vein: No evidence of thrombus. Normal compressibility and flow on color Doppler imaging. Femoral Vein: No evidence of thrombus. Normal compressibility, respiratory phasicity and response to augmentation. Popliteal Vein: No evidence of thrombus. Normal compressibility, respiratory phasicity and response to  augmentation. Calf Veins: No evidence of thrombus. Normal compressibility and flow on color Doppler imaging. Superficial Great Saphenous Vein: No evidence of thrombus. Normal compressibility. Venous Reflux:  None. Other Findings:  None. IMPRESSION: No evidence of deep venous thrombosis in either lower extremity. Electronically Signed   By: Tish Frederickson M.D.   On: 10/27/2023 01:41   DG Abd Portable 1V  Result Date: 10/25/2023 CLINICAL DATA:  NG placement EXAM: PORTABLE ABDOMEN - 1 VIEW COMPARISON:  Abdominal radiograph dated 10/25/2023. FINDINGS: Interval progression of the enteric tube with tip and side-port in the left upper abdomen likely in the proximal stomach. IMPRESSION: Enteric tube with tip and side-port in the proximal stomach. Electronically Signed   By: Elgie Collard M.D.   On: 10/25/2023 18:15   [place] EKG SITE RITE  Result Date: 10/25/2023 If Site Rite image not attached, placement could not be confirmed due to current cardiac rhythm.  DG Abd Portable 1V  Result Date: 10/25/2023 CLINICAL DATA:  NG tube placement EXAM: PORTABLE ABDOMEN - 1 VIEW COMPARISON:  KUB 2 days prior, CT abdomen/pelvis 1 day prior FINDINGS: The enteric catheter tip is in the stomach and sidehole is in the distal esophagus. There is a nonobstructive bowel gas pattern. There is no definite free intraperitoneal air. There is no abnormal soft tissue calcification. Contrast is noted in the bladder. There is no acute osseous abnormality. IMPRESSION: Enteric catheter sidehole in the distal esophagus. Recommend advancement by approximately 8 cm. These results will be called to the ordering clinician or representative by the Radiologist Assistant, and communication documented in the PACS or [employer]. Electronically Signed   By: Lesia Hausen M.D.   On: 10/25/2023 13:34    Anti-infectives: Anti-infectives (From admission, onward)    Start     Dose/Rate Route Frequency Ordered Stop   10/26/23 1400  ceFAZolin  (ANCEF) IVPB 2g/100 mL premix        2 g 200 mL/hr over 30 Minutes Intravenous On call to O.R. 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  He is status post open gastrojejunostomy on 11/8  -Patient with mild leukocytosis this morning.  Likely reactive to surgery.  Hemoglobin is stable at 10.8 -Would maintain NG tube on LIS until Monday.  Plan for upper GI at that time to evaluate for leak -NPO -TPN per hospitalist -Heparin drip currently running -Appreciate GI and hospitalist recommendations   LOS: 4 days    Ashliegh Parekh A Lesle Faron 10/27/2023

## 2023-10-27 NOTE — Anesthesia Postprocedure Evaluation (Signed)
Anesthesia Post Note  Patient: Matthew Molina  Procedure(s) Performed: GASTROJEJUNOSTOMY  Patient location during evaluation: Phase II Anesthesia Type: General Level of consciousness: awake Pain management: pain level controlled Vital Signs Assessment: post-procedure vital signs reviewed and stable Respiratory status: spontaneous breathing and respiratory function stable Cardiovascular status: blood pressure returned to baseline and stable Postop Assessment: no headache and no apparent nausea or vomiting Anesthetic complications: no Comments: Late entry   No notable events documented.   Last Vitals:  Vitals:   10/27/23 0814 10/27/23 0818  BP:    Pulse:    Resp:    Temp:  (!) 36.3 C  SpO2: 94%     Last Pain:  Vitals:   10/27/23 0818  TempSrc: Axillary  PainSc:                  Windell Norfolk

## 2023-10-27 NOTE — Progress Notes (Signed)
*  PRELIMINARY RESULTS* Echocardiogram 2D Echocardiogram has been performed.  Matthew Molina 10/27/2023, 1:21 PM

## 2023-10-27 NOTE — Progress Notes (Signed)
PHARMACY - TOTAL PARENTERAL NUTRITION CONSULT NOTE   Indication:  Gastric outlet obstruction  Patient Measurements: Height: 6' (182.9 cm) Weight: 85.3 kg (188 lb 0.8 oz) IBW/kg (Calculated) : 77.6 TPN AdjBW (KG): 89.5 Body mass index is 25.5 kg/m.   Assessment: Patient with gastric outlet obstruction that  is preventing him from passing any food or drink and resulting with his abdominal distention which is causing pain, nausea, and vomiting. Since this area of his duodenum has been inflamed for such a significant period of time and has caused secondary complications.  Gastrojejunostomy to bypass gastric outlet obstruction surgery completed 11/8. Plan to perform upper GI Monday (11/11) to evaluate for any leak.   Glucose / Insulin: BS 91-199. Novolog 8 units given Electrolytes: K 2.5 > 3.7> 2.5 (KCL IV x 4 runs ordered) Phos 2.4> 2.7 Mag 1.7 (4gm IV 11/8)> 2.1 Renal:  Hepatic:  Intake / Output; MIVF: MIVF stops 11/8 at 1800 GI Imaging:EGD 11/6 showed distal duodenal obstruction.  GI Surgeries / Procedures:   Central access: 11/8 PICC line TPN start date: 11/8  Nutritional Goals: Goal TPN rate is 84 mL/hr (provides 161 g of protein and 1810 kcals per day)  RD Assessment: Estimated Needs Total Energy Estimated Needs: 2300-2600 Total Protein Estimated Needs: 120-135 gm Total Fluid Estimated Needs: 2.3-2.6 L  Current Nutrition:  NPO  Plan:  Increase TPN to 71mL/hr at 1800 + lipids 20% infused over 12 hours daily KCL IV x 4 runs Electrolytes in TPN: Na 35 mEq/L, K 30 mEq/L, Ca 4.41mEq/L, Mg 85mEq/L, and Phos 39mmol/L. Cl:Ac 1:1 Add standard MVI and trace elements to TPN Initiate Sensitive q8h SSI and adjust as needed  Monitor TPN labs on Mon/Thurs, tomorrow AM  Elder Cyphers, BS Pharm D, BCPS Clinical Pharmacist 10/27/2023,8:10 AM

## 2023-10-28 DIAGNOSIS — K311 Adult hypertrophic pyloric stenosis: Secondary | ICD-10-CM | POA: Diagnosis not present

## 2023-10-28 DIAGNOSIS — E43 Unspecified severe protein-calorie malnutrition: Secondary | ICD-10-CM | POA: Diagnosis not present

## 2023-10-28 DIAGNOSIS — Z789 Other specified health status: Secondary | ICD-10-CM | POA: Diagnosis not present

## 2023-10-28 DIAGNOSIS — I81 Portal vein thrombosis: Secondary | ICD-10-CM | POA: Diagnosis not present

## 2023-10-28 LAB — BASIC METABOLIC PANEL
Anion gap: 11 (ref 5–15)
BUN: 35 mg/dL — ABNORMAL HIGH (ref 8–23)
CO2: 21 mmol/L — ABNORMAL LOW (ref 22–32)
Calcium: 10.1 mg/dL (ref 8.9–10.3)
Chloride: 100 mmol/L (ref 98–111)
Creatinine, Ser: 1.01 mg/dL (ref 0.61–1.24)
GFR, Estimated: >60 mL/min (ref >60)
Glucose, Bld: 270 mg/dL — ABNORMAL HIGH (ref 70–99)
Potassium: 3.5 mmol/L (ref 3.5–5.1)
Sodium: 132 mmol/L — ABNORMAL LOW (ref 135–145)

## 2023-10-28 LAB — CBC
HCT: 35.2 % — ABNORMAL LOW (ref 39.0–52.0)
Hemoglobin: 10.7 g/dL — ABNORMAL LOW (ref 13.0–17.0)
MCH: 27.2 pg (ref 26.0–34.0)
MCHC: 30.4 g/dL (ref 30.0–36.0)
MCV: 89.3 fL (ref 80.0–100.0)
Platelets: 274 10*3/uL (ref 150–400)
RBC: 3.94 MIL/uL — ABNORMAL LOW (ref 4.22–5.81)
RDW: 16.5 % — ABNORMAL HIGH (ref 11.5–15.5)
WBC: 8.9 10*3/uL (ref 4.0–10.5)
nRBC: 0 % (ref 0.0–0.2)

## 2023-10-28 LAB — COMPREHENSIVE METABOLIC PANEL
ALT: 12 U/L (ref 0–44)
AST: 11 U/L — ABNORMAL LOW (ref 15–41)
Albumin: 3 g/dL — ABNORMAL LOW (ref 3.5–5.0)
Alkaline Phosphatase: 31 U/L — ABNORMAL LOW (ref 38–126)
Anion gap: 8 (ref 5–15)
BUN: 29 mg/dL — ABNORMAL HIGH (ref 8–23)
CO2: 24 mmol/L (ref 22–32)
Calcium: 9.5 mg/dL (ref 8.9–10.3)
Chloride: 101 mmol/L (ref 98–111)
Creatinine, Ser: 0.98 mg/dL (ref 0.61–1.24)
GFR, Estimated: >60 mL/min (ref >60)
Glucose, Bld: 216 mg/dL — ABNORMAL HIGH (ref 70–99)
Potassium: 2.7 mmol/L — CL (ref 3.5–5.1)
Sodium: 133 mmol/L — ABNORMAL LOW (ref 135–145)
Total Bilirubin: 0.6 mg/dL (ref <1.2)
Total Protein: 6.3 g/dL — ABNORMAL LOW (ref 6.5–8.1)

## 2023-10-28 LAB — HEPARIN LEVEL (UNFRACTIONATED)
Heparin Unfractionated: 0.2 [IU]/mL — ABNORMAL LOW (ref 0.30–0.70)
Heparin Unfractionated: 0.24 [IU]/mL — ABNORMAL LOW (ref 0.30–0.70)
Heparin Unfractionated: >1.1 [IU]/mL — ABNORMAL HIGH (ref 0.30–0.70)
Heparin Unfractionated: 0.29 [IU]/mL — ABNORMAL LOW (ref 0.30–0.70)

## 2023-10-28 LAB — GLUCOSE, CAPILLARY
Glucose-Capillary: 193 mg/dL — ABNORMAL HIGH (ref 70–99)
Glucose-Capillary: 240 mg/dL — ABNORMAL HIGH (ref 70–99)
Glucose-Capillary: 245 mg/dL — ABNORMAL HIGH (ref 70–99)
Glucose-Capillary: 269 mg/dL — ABNORMAL HIGH (ref 70–99)
Glucose-Capillary: 272 mg/dL — ABNORMAL HIGH (ref 70–99)
Glucose-Capillary: 279 mg/dL — ABNORMAL HIGH (ref 70–99)

## 2023-10-28 LAB — POTASSIUM: Potassium: 6 mmol/L — ABNORMAL HIGH (ref 3.5–5.1)

## 2023-10-28 LAB — MAGNESIUM: Magnesium: 2.1 mg/dL (ref 1.7–2.4)

## 2023-10-28 MED ORDER — POTASSIUM CHLORIDE 10 MEQ/100ML IV SOLN
10.0000 meq | INTRAVENOUS | Status: AC
  Administered 2023-10-28 (×2): 10 meq via INTRAVENOUS
  Filled 2023-10-28 (×2): qty 100

## 2023-10-28 MED ORDER — METOPROLOL TARTRATE 5 MG/5ML IV SOLN
5.0000 mg | Freq: Four times a day (QID) | INTRAVENOUS | Status: DC
  Administered 2023-10-28 – 2023-11-01 (×13): 5 mg via INTRAVENOUS
  Filled 2023-10-28 (×15): qty 5

## 2023-10-28 MED ORDER — BISACODYL 10 MG RE SUPP
10.0000 mg | Freq: Every day | RECTAL | Status: DC
  Administered 2023-10-28 – 2023-10-30 (×3): 10 mg via RECTAL
  Filled 2023-10-28 (×3): qty 1

## 2023-10-28 MED ORDER — INSULIN ASPART 100 UNIT/ML IJ SOLN
0.0000 [IU] | INTRAMUSCULAR | Status: DC
  Administered 2023-10-28: 11 [IU] via SUBCUTANEOUS
  Administered 2023-10-28: 4 [IU] via SUBCUTANEOUS
  Administered 2023-10-29: 11 [IU] via SUBCUTANEOUS
  Administered 2023-10-29 (×3): 7 [IU] via SUBCUTANEOUS
  Administered 2023-10-29: 11 [IU] via SUBCUTANEOUS
  Administered 2023-10-29: 7 [IU] via SUBCUTANEOUS
  Administered 2023-10-30: 4 [IU] via SUBCUTANEOUS
  Administered 2023-10-30: 7 [IU] via SUBCUTANEOUS
  Administered 2023-10-30: 11 [IU] via SUBCUTANEOUS
  Administered 2023-10-30: 15 [IU] via SUBCUTANEOUS
  Administered 2023-10-30: 11 [IU] via SUBCUTANEOUS
  Administered 2023-10-31: 7 [IU] via SUBCUTANEOUS
  Administered 2023-10-31: 9 [IU] via SUBCUTANEOUS
  Administered 2023-10-31: 11 [IU] via SUBCUTANEOUS
  Administered 2023-10-31: 7 [IU] via SUBCUTANEOUS
  Administered 2023-10-31: 11 [IU] via SUBCUTANEOUS
  Administered 2023-10-31: 15 [IU] via SUBCUTANEOUS
  Administered 2023-11-01: 7 [IU] via SUBCUTANEOUS
  Administered 2023-11-01: 4 [IU] via SUBCUTANEOUS
  Administered 2023-11-01: 7 [IU] via SUBCUTANEOUS
  Administered 2023-11-01: 3 [IU] via SUBCUTANEOUS

## 2023-10-28 MED ORDER — INSULIN GLARGINE-YFGN 100 UNIT/ML ~~LOC~~ SOLN
15.0000 [IU] | Freq: Every day | SUBCUTANEOUS | Status: DC
  Administered 2023-10-28: 15 [IU] via SUBCUTANEOUS
  Filled 2023-10-28 (×2): qty 0.15

## 2023-10-28 MED ORDER — HEPARIN BOLUS VIA INFUSION
1000.0000 [IU] | Freq: Once | INTRAVENOUS | Status: AC
  Administered 2023-10-28: 1000 [IU] via INTRAVENOUS
  Filled 2023-10-28: qty 1000

## 2023-10-28 MED ORDER — FAT EMUL FISH OIL/PLANT BASED 20% (SMOFLIPID)IV EMUL
250.0000 mL | INTRAVENOUS | Status: AC
Start: 2023-10-28 — End: 2023-10-29
  Administered 2023-10-28: 250 mL via INTRAVENOUS
  Filled 2023-10-28: qty 250

## 2023-10-28 MED ORDER — HEPARIN BOLUS VIA INFUSION
1300.0000 [IU] | Freq: Once | INTRAVENOUS | Status: AC
  Administered 2023-10-28: 1300 [IU] via INTRAVENOUS
  Filled 2023-10-28: qty 1300

## 2023-10-28 MED ORDER — POTASSIUM CHLORIDE 10 MEQ/100ML IV SOLN
10.0000 meq | INTRAVENOUS | Status: AC
  Administered 2023-10-28 (×4): 10 meq via INTRAVENOUS
  Filled 2023-10-28 (×4): qty 100

## 2023-10-28 MED ORDER — TRACE MINERALS CU-MN-SE-ZN 300-55-60-3000 MCG/ML IV SOLN
INTRAVENOUS | Status: AC
  Filled 2023-10-28 (×2): qty 2000

## 2023-10-28 NOTE — Progress Notes (Signed)
[facility] Progress Note  2 Days Post-Op  Subjective:  Patient seen and examined.  He is resting comfortably in bed.  His NG tube is had 2.9 L in the last 24 hours, though it is slowly decreasing in volume.  He complains of upper abdominal pain but is otherwise doing well.  Objective: Vital signs in last 24 hours: Temp:  [97.5 F (36.4 C)-98.2 F (36.8 C)] 97.7 F (36.5 C) (11/10 0800) Pulse Rate:  [86-110] 100 (11/10 0900) Resp:  [13-27] 22 (11/10 0900) BP: (124-166)/(76-106) 133/87 (11/10 0900) SpO2:  [89 %-98 %] 95 % (11/10 0900) Weight:  [84.4 kg] 84.4 kg (11/10 0500) Last BM Date : 10/22/23  Intake/Output from previous day: 11/09 0701 - 11/10 0700 In: 2807.2 [I.V.:2304.1; NG/GT:150; IV Piggyback:353.1] Out: 3600 [Urine:700; Emesis/NG output:2900] Intake/Output this shift: Total I/O In: 10 [I.V.:10] Out: -   General appearance: alert, cooperative, and no distress GI: Abdomen soft, nondistended, no percussion tenderness, upper abdominal tenderness to palpation associated with incision site; no rigidity, guarding, rebound tenderness; midline incision C/D/I with honeycomb dressing in place  Lab Results:  Recent Labs    10/27/23 0513 10/28/23 0338  WBC 11.6* 8.9  HGB 10.8* 10.7*  HCT 34.5* 35.2*  PLT 284 274   BMET Recent Labs    10/27/23 0513 10/28/23 0338  NA 133* 133*  K 2.6* 2.7*  CL 100 101  CO2 23 24  GLUCOSE 199* 216*  BUN 19 29*  CREATININE 1.14 0.98  CALCIUM 9.2 9.5   PT/INR No results for input(s): "LABPROT", "INR" in the last 72 hours.  Studies/Results: ECHOCARDIOGRAM COMPLETE  Result Date: 10/27/2023    ECHOCARDIOGRAM REPORT   Patient Name:   LANIS HOTTE Date of Exam: 10/27/2023 Medical Rec #:  [id number]      Height:       72.0 in Accession #:    [id number]     Weight:       188.1 lb Date of Birth:  03/09/1951      BSA:          2.076 m Patient Age:    72 years       BP:           166/92 mmHg Patient Gender: M               HR:           90 bpm. [facility] Procedure: 2D Echo, Cardiac Doppler, Color Doppler and Intracardiac            Opacification Agent Indications:    Pulmonary embolus  History:        Patient has no prior history of Echocardiogram examinations.                 CKD3 and COPD; Risk Factors:Current Smoker, Hypertension and                 Dyslipidemia.  Sonographer:    Dondra Prader RVT RCS Referring Phys: [id number] Tamica Covell A Martin Belling  Sonographer Comments: Technically challenging study due to limited acoustic windows, suboptimal subcostal window and suboptimal apical window. Image acquisition challenging due to respiratory motion, unable to breath hold and Image acquisition challenging due to COPD. IMPRESSIONS  1. Technically difficult study.  2. Left ventricular ejection fraction, by estimation, is >75%. The left ventricle has normal function. There is moderate left ventricular hypertrophy. Left ventricular diastolic parameters were normal.  3. Right ventricular is not well-seen. Right  ventricular size grossly normal, systolic function visually preserved.  4. Right atrial size was visually normal.  5. The mitral valve is grossly normal. No evidence of mitral valve regurgitation. No evidence of mitral stenosis.  6. The aortic valve is tricuspid. Aortic valve regurgitation is not visualized. No aortic stenosis is present.  7. There is dilatation of the ascending aorta, measuring 41 mm.  8. The inferior vena cava is dilated in size with >50% respiratory variability, suggesting right atrial pressure of 8 mmHg. Comparison(s): No prior Echocardiogram. FINDINGS  Left Ventricle: Left ventricular ejection fraction, by estimation, is >75%. The left ventricle has normal function. Definity contrast agent was given IV to delineate the left ventricular endocardial borders. The left ventricular internal cavity size was  small. There is moderate left ventricular hypertrophy. Left ventricular diastolic parameters were  normal. Right Ventricle: Right ventricular is not well-seen. Right ventricular size grossly normal, systolic function visually preserved. Left Atrium: Left atrial size was normal in size. Right Atrium: Right atrial size was visually normal. Pericardium: There is no evidence of pericardial effusion. Mitral Valve: The mitral valve is grossly normal. No evidence of mitral valve regurgitation. No evidence of mitral valve stenosis. Tricuspid Valve: The tricuspid valve is normal in structure. Tricuspid valve regurgitation is not demonstrated. No evidence of tricuspid stenosis. Aortic Valve: The aortic valve is tricuspid. Aortic valve regurgitation is not visualized. No aortic stenosis is present. Aortic valve mean gradient measures 3.0 mmHg. Aortic valve peak gradient measures 5.9 mmHg. Aortic valve area, by VTI measures 2.87 cm. Pulmonic Valve: The pulmonic valve was normal in structure. Pulmonic valve regurgitation is not visualized. No evidence of pulmonic stenosis. Aorta: The aortic root is normal in size and structure. There is dilatation of the ascending aorta, measuring 41 mm. Venous: The inferior vena cava is dilated in size with greater than 50% respiratory variability, suggesting right atrial pressure of 8 mmHg. IAS/Shunts: The interatrial septum was not well visualized.  LEFT VENTRICLE PLAX 2D LVIDd:         3.70 cm   Diastology LVIDs:         2.10 cm   LV e' medial:    3.57 cm/s LV PW:         1.60 cm   LV E/e' medial:  12.3 LV IVS:        1.30 cm   LV e' lateral:   4.14 cm/s LVOT diam:     2.10 cm   LV E/e' lateral: 10.6 LV SV:         53 LV SV Index:   25 LVOT Area:     3.46 cm  RIGHT VENTRICLE             IVC RV S prime:     11.40 cm/s  IVC diam: 2.10 cm LEFT ATRIUM             Index LA diam:        3.30 cm 1.59 cm/m LA Vol (A2C):   36.6 ml 17.63 ml/m LA Vol (A4C):   37.6 ml 18.12 ml/m LA Biplane Vol: 39.6 ml 19.08 ml/m  AORTIC VALVE                    PULMONIC VALVE AV Area (Vmax):    3.14 cm     PV  Vmax:       0.70 m/s AV Area (Vmean):   2.99 cm     PV Peak grad:  2.0 mmHg AV  Area (VTI):     2.87 cm AV Vmax:           121.00 cm/s AV Vmean:          77.033 cm/s AV VTI:            0.183 m AV Peak Grad:      5.9 mmHg AV Mean Grad:      3.0 mmHg LVOT Vmax:         109.67 cm/s LVOT Vmean:        66.533 cm/s LVOT VTI:          0.152 m LVOT/AV VTI ratio: 0.83  AORTA Ao Root diam: 3.70 cm Ao Asc diam:  4.10 cm MITRAL VALVE MV Area (PHT): 3.77 cm     SHUNTS MV Decel Time: 201 msec     Systemic VTI:  0.15 m MV E velocity: 44.00 cm/s   Systemic Diam: 2.10 cm MV A velocity: 110.00 cm/s MV E/A ratio:  0.40 Sunit Tolia Electronically signed by Tessa Lerner Signature Date/Time: 10/27/2023/2:57:03 PM    Final    US Venous Img Lower Bilateral (DVT)  Result Date: 10/27/2023 CLINICAL DATA:  Acute pulmonary embolus. Tobacco use. Hypoxia and hospital. Respiratory failure acute. History of diabetes, obesity, COPD. Concern for DVT. EXAM: BILATERAL LOWER EXTREMITY VENOUS DOPPLER ULTRASOUND TECHNIQUE: Gray-scale sonography with graded compression, as well as color Doppler and duplex ultrasound were performed to evaluate the lower extremity deep venous systems from the level of the common femoral vein and including the common femoral, femoral, profunda femoral, popliteal and calf veins including the posterior tibial, peroneal and gastrocnemius veins when visible. The superficial great saphenous vein was also interrogated. Spectral Doppler was utilized to evaluate flow at rest and with distal augmentation maneuvers in the common femoral, femoral and popliteal veins. COMPARISON:  None Available. FINDINGS: RIGHT LOWER EXTREMITY Common Femoral Vein: No evidence of thrombus. Normal compressibility, respiratory phasicity and response to augmentation. Saphenofemoral Junction: No evidence of thrombus. Normal compressibility and flow on color Doppler imaging. Profunda Femoral Vein: No evidence of thrombus. Normal compressibility and flow  on color Doppler imaging. Femoral Vein: No evidence of thrombus. Normal compressibility, respiratory phasicity and response to augmentation. Popliteal Vein: No evidence of thrombus. Normal compressibility, respiratory phasicity and response to augmentation. Calf Veins: No evidence of thrombus. Normal compressibility and flow on color Doppler imaging. Superficial Great Saphenous Vein: No evidence of thrombus. Normal compressibility. Venous Reflux:  None. Other Findings:  None. LEFT LOWER EXTREMITY Common Femoral Vein: No evidence of thrombus. Normal compressibility, respiratory phasicity and response to augmentation. Saphenofemoral Junction: No evidence of thrombus. Normal compressibility and flow on color Doppler imaging. Profunda Femoral Vein: No evidence of thrombus. Normal compressibility and flow on color Doppler imaging. Femoral Vein: No evidence of thrombus. Normal compressibility, respiratory phasicity and response to augmentation. Popliteal Vein: No evidence of thrombus. Normal compressibility, respiratory phasicity and response to augmentation. Calf Veins: No evidence of thrombus. Normal compressibility and flow on color Doppler imaging. Superficial Great Saphenous Vein: No evidence of thrombus. Normal compressibility. Venous Reflux:  None. Other Findings:  None. IMPRESSION: No evidence of deep venous thrombosis in either lower extremity. Electronically Signed   By: Tish Frederickson M.D.   On: 10/27/2023 01:41    Anti-infectives: Anti-infectives (From admission, onward)    Start     Dose/Rate Route Frequency Ordered Stop   10/26/23 1400  ceFAZolin (ANCEF) IVPB 2g/100 mL premix        2 g 200 mL/hr over  30 Minutes Intravenous On call to O.R. 10/26/23 1341 10/26/23 1558   10/26/23 1343  ceFAZolin (ANCEF) 2-4 GM/100ML-% IVPB       Note to Pharmacy: Sherren Kerns H: cabinet override      10/26/23 1343 10/26/23 1559       Assessment/Plan:  Patient is a 72 year old male who was admitted with  gastric outlet obstruction thought to be secondary to a peptic ulcer stricture of the duodenum.  He is status post open gastrojejunostomy on 11/8.  -Patient with resolution of his leukocytosis this morning.  Hemoglobin stable at 10.7 from 10.8 -Maintain NG tube to LIS -NPO -Likely plan for upper GI tomorrow to evaluate for a leak.  If patient continues to have high output from his NG tube, may need to delay until Tuesday or Wednesday -TPN per hospitalist -Okay to continue heparin drip -Appreciate hospitalist recommendations   LOS: 5 days    Pranavi Aure A Eboney Claybrook 10/28/2023

## 2023-10-28 NOTE — Progress Notes (Addendum)
PROGRESS NOTE   Matthew Molina  ZOX:[id number] DOB: 02/05/1951 DOA: 10/23/2023 PCP: Kerri Perches, MD   No chief complaint on file.  Level of care: ICU  Brief Admission History:  72 year old male with a history of IgG MGUS, COPD, diabetes mellitus type 2, hypertension, hyperlipidemia, depression, tobacco abuse, coronary disease presenting with generalized weakness, abdominal pain, nausea and vomiting. The patient was recently admitted to the hospital from 09/30/2023 to 10/03/2023.  At that time, the patient had nausea and vomiting and poor oral intake and upper abdominal pain as well.  The patient was treated for sepsis secondary to aspiration pneumonia.  GI was consulted during the admission.  The patient underwent EGD on 10/02/2023 which showed grade B esophagitis without bleeding, nonbleeding gastric ulcers with a clean base, and duodenitis.  The patient was discharged home with PPI twice daily and sulcrafate.  The patient states that he was feeling well after d/c and he was tolerating a soft diet. However, he but began having gradual abdominal pain and belching in the past 1 week.  He states that things have gradually worsened over the past 3 days prior to admission with increasing belching and hiccups that has resulted in vomiting every time he belches.  He has had poor oral intake and progressive generalized weakness.  He also complains of worsening mid abdominal pain.  He denies f/c, cp, sob, hemoptysis, diarrhea, hematochezia, melena, dysuria, hematuria.  As result of abd pain with n/v, the patient presented for further evaluation and treatment.  He continues to smoke a few cigarette's a day. Notably, patient states he ate spaghetti and chocolate chip cookies before he came to ED and did not have any emesis.   In the ED, the patient was afebrile hemodynamically stable with oxygen saturation 92% on room air.  WBC 6.0, hemoglobin 11.1, platelets 332.  Sodium 132, potassium 3.1, bicarbonate  28, serum creatinine 1.53.  LFTs were unremarkable.  Lipase 84.  CT of the abdomen and pelvis showed marked distention of the stomach to the level of the duodenum where there is circumferential thickening and heterogenous hypoattenuation at the site of the patient's previous duodenitis. There was a new hypodensity upstream portal vein suspicious for nonocclusive thrombus.  There is also a partially imaged hypodensity in the right upper lobe pulmonary artery which may represent artifact versus PE. The patient was started on IV heparin. -GI was consulted to assist with management.   Assessment and Plan:  Severe distal duodenal obstruction  - GI recommended CTA abdomen completed to rule out ischemic process, consult general surgery for gastrojejunostomy for drainage  - NG tube in place - continue pantoprazole  - Postop s/p gastrojejunostomy on 10/26/23  - pharm D consulted to start TPN on 11/9 - PICC line placed  - further recommendations to follow  - upper GI planned for next 1-2 days per surgery to check for leaks   Question of portal venous thrombosis Question of PE - CTA chest/abd/pelvis unfortunately not able to evaluate for PE given timing of contrast bolus given  - liver doppler without evidence of portal vein thrombosis  - IV heparin infusion resumed for full dose anticoagulation - D dimer markedly elevated at 3.65 so we presume he does have a PE - check venous dopplers of legs to assess for DVT -- negative for DVT - check TTE to evaluate for right heart strain : Left ventricular ejection fraction, by estimation, is >75%. The left ventricle has normal function. There is moderate left ventricular  hypertrophy. Left ventricular diastolic parameters were normal. This was a technically difficult study.    CKD stage IIIa -Baseline creatinine 1.2-1.5 -Monitor daily BMP   Hypokalemia - IV replacement ordered - Mg replaced - recheck K at 2pm and give additional IV K if needed - discussed  with pharm D  COPD -Stable on room air -Restart BDs -Continue Singulair when able to tolerate p.o.   Tobacco abuse -Tobacco cessation discussed   Depression/anxiety -restart risperdal and buspar when able to tolerate po   Controlled diabetes mellitus type 2 TPN ASSOCIATED HYPERGLYCEMIA -starting semglee 15 units daily and monitor to titrate -10/07/2023 hemoglobin A1c 6.0   BPH -Restart tamsulosin when able to tolerate p.o   DVT prophylaxis:  SCDs Code Status: FULL  Family Communication:  Disposition: transferred to ICU    Consultants:  GI   Procedures:  EGD 10/24/23   Antimicrobials:    Subjective: Reports light tenderness in the abdomen and discomfort from NG tube   Objective: Vitals:   10/28/23 0700 10/28/23 0800 10/28/23 0832 10/28/23 0900  BP: (!) 150/102 (!) 142/91  133/87  Pulse: 91 90  100  Resp: 18 (!) 22  (!) 22  Temp:  97.7 F (36.5 C)    TempSrc:  Oral    SpO2: 96% 96% 96% 95%  Weight:      Height:        Intake/Output Summary (Last 24 hours) at 10/28/2023 1201 Last data filed at 10/28/2023 0818 Gross per 24 hour  Intake 2807.15 ml  Output 2850 ml  Net -42.85 ml   Filed Weights   10/26/23 1333 10/27/23 0500 10/28/23 0500  Weight: 89.5 kg 85.3 kg 84.4 kg   Examination:  General exam: awake, alert, moderately stressed.  NG tube in place.   Respiratory system: BBS heard. No rales. Cardiovascular system: normal S1 & S2 heard. No JVD, murmurs, rubs, gallops or clicks. No pedal edema. Gastrointestinal system: Abdomen is distended, soft, tender to light palpation.  No organomegaly or masses felt. Normal bowel sounds heard. Central nervous system: sedated.  No focal neurological deficits. Extremities: Symmetric 5 x 5 power. Skin: No rashes, lesions or ulcers. Psychiatry: Judgement and insight UTD   Data Reviewed: I have personally reviewed following labs and imaging studies  CBC: Recent Labs  Lab 10/23/23 1000 10/24/23 0002  10/25/23 0441 10/26/23 0619 10/26/23 1351 10/27/23 0513 10/28/23 0338  WBC 6.0 5.9 5.4 5.5  --  11.6* 8.9  NEUTROABS 4.3  --   --   --   --   --   --   HGB 11.1* 9.9* 10.7* 10.0* 12.2* 10.8* 10.7*  HCT 35.2* 31.2* 35.3* 32.8* 36.0* 34.5* 35.2*  MCV 89.6 89.4 93.4 89.1  --  90.1 89.3  PLT 332 279 257 275  --  284 274    Basic Metabolic Panel: Recent Labs  Lab 10/24/23 0852 10/25/23 0441 10/26/23 0619 10/26/23 1351 10/27/23 0513 10/28/23 0338  NA 134* 136 136 137 133* 133*  K 2.8* 3.5 2.5* 3.7 2.6* 2.7*  CL 96* 101 99 97* 100 101  CO2 26 19* 27  --  23 24  GLUCOSE 80 109* 91 97 199* 216*  BUN 18 20 17 18 19  29*  CREATININE 1.24 1.26* 1.28* 1.40* 1.14 0.98  CALCIUM 9.3 9.6 9.8  --  9.2 9.5  MG 1.7 1.9 1.7  --  2.1 2.1  PHOS  --   --  2.4*  --  2.7  --  CBG: Recent Labs  Lab 10/27/23 1203 10/27/23 1639 10/28/23 0005 10/28/23 0559 10/28/23 1144  GLUCAP 192* 197* 279* 269* 240*    Recent Results (from the past 240 hour(s))  MRSA Next Gen by PCR, Nasal     Status: None   Collection Time: 10/24/23  4:00 PM   Specimen: Nasal Mucosa; Nasal Swab  Result Value Ref Range Status   MRSA by PCR Next Gen NOT DETECTED NOT DETECTED Final    Comment: (NOTE) The GeneXpert MRSA Assay (FDA approved for NASAL specimens only), is one component of a comprehensive MRSA colonization surveillance program. It is not intended to diagnose MRSA infection nor to guide or monitor treatment for MRSA infections. Test performance is not FDA approved in patients less than 34 years old. Performed at [facility], [address], [place], [place] [address]      Radiology Studies: ECHOCARDIOGRAM COMPLETE  Result Date: 10/27/2023    ECHOCARDIOGRAM REPORT   Patient Name:   KARTER PREISER Date of Exam: 10/27/2023 Medical Rec #:  [id number]      Height:       72.0 in Accession #:    [id number]     Weight:       188.1 lb Date of Birth:  05-06-1951      BSA:          2.076 m Patient Age:    72 years        BP:           166/92 mmHg Patient Gender: M              HR:           90 bpm. [facility] Procedure: 2D Echo, Cardiac Doppler, Color Doppler and Intracardiac            Opacification Agent Indications:    Pulmonary embolus  History:        Patient has no prior history of Echocardiogram examinations.                 CKD3 and COPD; Risk Factors:Current Smoker, Hypertension and                 Dyslipidemia.  Sonographer:    Dondra Prader RVT RCS Referring Phys: [id number] CATHERINE A PAPPAYLIOU  Sonographer Comments: Technically challenging study due to limited acoustic windows, suboptimal subcostal window and suboptimal apical window. Image acquisition challenging due to respiratory motion, unable to breath hold and Image acquisition challenging due to COPD. IMPRESSIONS  1. Technically difficult study.  2. Left ventricular ejection fraction, by estimation, is >75%. The left ventricle has normal function. There is moderate left ventricular hypertrophy. Left ventricular diastolic parameters were normal.  3. Right ventricular is not well-seen. Right ventricular size grossly normal, systolic function visually preserved.  4. Right atrial size was visually normal.  5. The mitral valve is grossly normal. No evidence of mitral valve regurgitation. No evidence of mitral stenosis.  6. The aortic valve is tricuspid. Aortic valve regurgitation is not visualized. No aortic stenosis is present.  7. There is dilatation of the ascending aorta, measuring 41 mm.  8. The inferior vena cava is dilated in size with >50% respiratory variability, suggesting right atrial pressure of 8 mmHg. Comparison(s): No prior Echocardiogram. FINDINGS  Left Ventricle: Left ventricular ejection fraction, by estimation, is >75%. The left ventricle has normal function. Definity contrast agent was given IV to delineate the left ventricular endocardial borders. The left ventricular internal  cavity size was  small. There is moderate left  ventricular hypertrophy. Left ventricular diastolic parameters were normal. Right Ventricle: Right ventricular is not well-seen. Right ventricular size grossly normal, systolic function visually preserved. Left Atrium: Left atrial size was normal in size. Right Atrium: Right atrial size was visually normal. Pericardium: There is no evidence of pericardial effusion. Mitral Valve: The mitral valve is grossly normal. No evidence of mitral valve regurgitation. No evidence of mitral valve stenosis. Tricuspid Valve: The tricuspid valve is normal in structure. Tricuspid valve regurgitation is not demonstrated. No evidence of tricuspid stenosis. Aortic Valve: The aortic valve is tricuspid. Aortic valve regurgitation is not visualized. No aortic stenosis is present. Aortic valve mean gradient measures 3.0 mmHg. Aortic valve peak gradient measures 5.9 mmHg. Aortic valve area, by VTI measures 2.87 cm. Pulmonic Valve: The pulmonic valve was normal in structure. Pulmonic valve regurgitation is not visualized. No evidence of pulmonic stenosis. Aorta: The aortic root is normal in size and structure. There is dilatation of the ascending aorta, measuring 41 mm. Venous: The inferior vena cava is dilated in size with greater than 50% respiratory variability, suggesting right atrial pressure of 8 mmHg. IAS/Shunts: The interatrial septum was not well visualized.  LEFT VENTRICLE PLAX 2D LVIDd:         3.70 cm   Diastology LVIDs:         2.10 cm   LV e' medial:    3.57 cm/s LV PW:         1.60 cm   LV E/e' medial:  12.3 LV IVS:        1.30 cm   LV e' lateral:   4.14 cm/s LVOT diam:     2.10 cm   LV E/e' lateral: 10.6 LV SV:         53 LV SV Index:   25 LVOT Area:     3.46 cm  RIGHT VENTRICLE             IVC RV S prime:     11.40 cm/s  IVC diam: 2.10 cm LEFT ATRIUM             Index LA diam:        3.30 cm 1.59 cm/m LA Vol (A2C):   36.6 ml 17.63 ml/m LA Vol (A4C):   37.6 ml 18.12 ml/m LA Biplane Vol: 39.6 ml 19.08 ml/m  AORTIC VALVE                     PULMONIC VALVE AV Area (Vmax):    3.14 cm     PV Vmax:       0.70 m/s AV Area (Vmean):   2.99 cm     PV Peak grad:  2.0 mmHg AV Area (VTI):     2.87 cm AV Vmax:           121.00 cm/s AV Vmean:          77.033 cm/s AV VTI:            0.183 m AV Peak Grad:      5.9 mmHg AV Mean Grad:      3.0 mmHg LVOT Vmax:         109.67 cm/s LVOT Vmean:        66.533 cm/s LVOT VTI:          0.152 m LVOT/AV VTI ratio: 0.83  AORTA Ao Root diam: 3.70 cm Ao Asc diam:  4.10 cm MITRAL VALVE  MV Area (PHT): 3.77 cm     SHUNTS MV Decel Time: 201 msec     Systemic VTI:  0.15 m MV E velocity: 44.00 cm/s   Systemic Diam: 2.10 cm MV A velocity: 110.00 cm/s MV E/A ratio:  0.40 Sunit Tolia Electronically signed by Tessa Lerner Signature Date/Time: 10/27/2023/2:57:03 PM    Final    US Venous Img Lower Bilateral (DVT)  Result Date: 10/27/2023 CLINICAL DATA:  Acute pulmonary embolus. Tobacco use. Hypoxia and hospital. Respiratory failure acute. History of diabetes, obesity, COPD. Concern for DVT. EXAM: BILATERAL LOWER EXTREMITY VENOUS DOPPLER ULTRASOUND TECHNIQUE: Gray-scale sonography with graded compression, as well as color Doppler and duplex ultrasound were performed to evaluate the lower extremity deep venous systems from the level of the common femoral vein and including the common femoral, femoral, profunda femoral, popliteal and calf veins including the posterior tibial, peroneal and gastrocnemius veins when visible. The superficial great saphenous vein was also interrogated. Spectral Doppler was utilized to evaluate flow at rest and with distal augmentation maneuvers in the common femoral, femoral and popliteal veins. COMPARISON:  None Available. FINDINGS: RIGHT LOWER EXTREMITY Common Femoral Vein: No evidence of thrombus. Normal compressibility, respiratory phasicity and response to augmentation. Saphenofemoral Junction: No evidence of thrombus. Normal compressibility and flow on color Doppler imaging. Profunda  Femoral Vein: No evidence of thrombus. Normal compressibility and flow on color Doppler imaging. Femoral Vein: No evidence of thrombus. Normal compressibility, respiratory phasicity and response to augmentation. Popliteal Vein: No evidence of thrombus. Normal compressibility, respiratory phasicity and response to augmentation. Calf Veins: No evidence of thrombus. Normal compressibility and flow on color Doppler imaging. Superficial Great Saphenous Vein: No evidence of thrombus. Normal compressibility. Venous Reflux:  None. Other Findings:  None. LEFT LOWER EXTREMITY Common Femoral Vein: No evidence of thrombus. Normal compressibility, respiratory phasicity and response to augmentation. Saphenofemoral Junction: No evidence of thrombus. Normal compressibility and flow on color Doppler imaging. Profunda Femoral Vein: No evidence of thrombus. Normal compressibility and flow on color Doppler imaging. Femoral Vein: No evidence of thrombus. Normal compressibility, respiratory phasicity and response to augmentation. Popliteal Vein: No evidence of thrombus. Normal compressibility, respiratory phasicity and response to augmentation. Calf Veins: No evidence of thrombus. Normal compressibility and flow on color Doppler imaging. Superficial Great Saphenous Vein: No evidence of thrombus. Normal compressibility. Venous Reflux:  None. Other Findings:  None. IMPRESSION: No evidence of deep venous thrombosis in either lower extremity. Electronically Signed   By: Tish Frederickson M.D.   On: 10/27/2023 01:41    Scheduled Meds:  acetaminophen  1,000 mg Per Tube Q6H   arformoterol  15 mcg Nebulization BID   bisacodyl  10 mg Rectal Daily   budesonide (PULMICORT) nebulizer solution  0.5 mg Nebulization BID   busPIRone  7.5 mg Per Tube TID   Chlorhexidine Gluconate Cloth  6 each Topical Daily   docusate  100 mg Per Tube BID   doxazosin  1 mg Per Tube q1800   insulin aspart  0-15 Units Subcutaneous Q6H   ipratropium-albuterol  3  mL Nebulization TID   metoprolol tartrate  2.5 mg Intravenous Q6H   montelukast  10 mg Per Tube QHS   pantoprazole (PROTONIX) IV  40 mg Intravenous Q12H   polyethylene glycol  17 g Per Tube Daily   risperiDONE  1 mg Per Tube QHS   sodium chloride flush  10-40 mL Intracatheter Q12H   sucralfate  1 g Per Tube TID WC & HS   Continuous Infusions:  .  TPN (CLINIMIX-E) Adult 84 mL/hr at 10/28/23 0656   .TPN (CLINIMIX-E) Adult     And   fat emul(SMOFlipid)     heparin 1,750 Units/hr (10/28/23 0857)     LOS: 5 days   Critical Care Procedure Note Authorized and Performed by: Maryln Manuel MD  Total Critical Care time:  56 mins Due to a high probability of clinically significant, life threatening deterioration, the patient required my highest level of preparedness to intervene emergently and I personally spent this critical care time directly and personally managing the patient.  This critical care time included obtaining a history; examining the patient, pulse oximetry; ordering and review of studies; arranging urgent treatment with development of a management plan; evaluation of patient's response of treatment; frequent reassessment; and discussions with other providers.  This critical care time was performed to assess and manage the high probability of imminent and life threatening deterioration that could result in multi-organ failure.  It was exclusive of separately billable procedures and treating other patients and teaching time.   Standley Dakins, MD How to contact the [facility] Attending or Consulting provider 7A - 7P or covering provider during after hours 7P -7A, for this patient?  Check the care team in [facility] and look for a) attending/consulting TRH provider listed and b) the [facility] team listed Log into [email or web address] to find provider on call.  Locate the [facility] provider you are looking for under [facility] and page to a number that you can be directly reached. If you still have difficulty  reaching the provider, please page the [facility] (Director on Call) for the Hospitalists listed on amion for assistance.  10/28/2023, 12:01 PM

## 2023-10-28 NOTE — Progress Notes (Signed)
PHARMACY - ANTICOAGULATION Pharmacy Consult for heparin Indication:  VTE treatment   Allergies  Allergen Reactions   Asa [Aspirin] Other (See Comments)    Stomach pain   Crestor [Rosuvastatin] Other (See Comments)    Stomach pain   Morphine Nausea And Vomiting   Wellbutrin [Bupropion] Other (See Comments)    Stomach pain   Zoloft [Sertraline Hcl] Other (See Comments)    Stomach pain    Patient Measurements: Height: 6' (182.9 cm) Weight: 84.4 kg (186 lb 1.1 oz) IBW/kg (Calculated) : 77.6 Heparin Dosing Weight: 88 kg  Vital Signs: Temp: 97.7 F (36.5 C) (11/10 0800) Temp Source: Oral (11/10 0800) BP: 142/91 (11/10 0800) Pulse Rate: 90 (11/10 0800)  Labs: Recent Labs    10/26/23 0619 10/26/23 1351 10/27/23 0513 10/27/23 0700 10/28/23 0338 10/28/23 0525  HGB 10.0* 12.2* 10.8*  --  10.7*  --   HCT 32.8* 36.0* 34.5*  --  35.2*  --   PLT 275  --  284  --  274  --   HEPARINUNFRC 0.10*  --   --  0.33 0.20* 0.24*  CREATININE 1.28* 1.40* 1.14  --  0.98  --     Estimated Creatinine Clearance: 73.7 mL/min (by C-G formula based on SCr of 0.98 mg/dL).  Assessment: 72 y.o. male with possible PE for heparin.  Ultrasound negative for portal vein thrombosis.   HL 0.33> 0.24- now subtherapeutic . No issues with infusion Hgb 10.7> 12.2> 10.8> 10.7 , stable  Goal of Therapy:  Heparin level 0.3-0.7 units/ml Monitor platelets by anticoagulation protocol: Yes   Plan:  Heparin bolus 1000 units, then  Increase heparin to1750 units/hr.  Heparin level  and ~8 hours and daily Continue to monitor CBC   Elder Cyphers, BS Pharm D, BCPS Clinical Pharmacist 10/28/2023 8:46 AM

## 2023-10-28 NOTE — Progress Notes (Signed)
PHARMACY - TOTAL PARENTERAL NUTRITION CONSULT NOTE   Indication:  Gastric outlet obstruction  Patient Measurements: Height: 6' (182.9 cm) Weight: 84.4 kg (186 lb 1.1 oz) IBW/kg (Calculated) : 77.6 TPN AdjBW (KG): 89.5 Body mass index is 25.24 kg/m.   Assessment: Patient with gastric outlet obstruction that  is preventing him from passing any food or drink and resulting with his abdominal distention which is causing pain, nausea, and vomiting. Since this area of his duodenum has been inflamed for such a significant period of time and has caused secondary complications.  Gastrojejunostomy to bypass gastric outlet obstruction surgery completed 11/8. Plan to perform upper GI Monday (11/11) to evaluate for any leak.   Glucose / Insulin: BS 192-269. Novolog 22 units given Electrolytes: Na 137> 133> 133 K 2.5 > 3.7> 2.5 > 2.6> 2.7(KCL IV x 4 runs ordered) Phos 2.4> 2.7 Mag 1.7 (4gm IV 11/8)> 2.1 Renal:  Hepatic:  Intake / Output; MIVF: MIVF stops 11/8 at 1800 GI Imaging:EGD 11/6 showed distal duodenal obstruction.  GI Surgeries / Procedures:   Central access: 11/8 PICC line TPN start date: 11/8  Nutritional Goals: Goal TPN rate is 84 mL/hr (provides 161 g of protein and 1810 kcals per day)  RD Assessment: Estimated Needs Total Energy Estimated Needs: 2300-2600 Total Protein Estimated Needs: 120-135 gm Total Fluid Estimated Needs: 2.3-2.6 L  Current Nutrition:  NPO  Plan:  Continue TPN to 16mL/hr at 1800 + lipids 20% infused over 12 hours daily KCL IV x 4 runs ordered by MD Electrolytes in TPN: Na 35 mEq/L, K 30 mEq/L, Ca 4.36mEq/L, Mg 52mEq/L, and Phos 60mmol/L. Cl:Ac 1:1 Add standard MVI and trace elements to TPN Initiate Sensitive q6h SSI and adjust as needed  Monitor TPN labs on Mon/Thurs  Elder Cyphers, BS Pharm D, BCPS Clinical Pharmacist 10/28/2023,9:08 AM

## 2023-10-28 NOTE — Plan of Care (Signed)
  Problem: Education: Goal: Knowledge of General Education information will improve Description: Including pain rating scale, medication(s)/side effects and non-pharmacologic comfort measures Outcome: Progressing   Problem: Health Behavior/Discharge Planning: Goal: Ability to manage health-related needs will improve Outcome: Progressing   Problem: Clinical Measurements: Goal: Ability to maintain clinical measurements within normal limits will improve Outcome: Progressing Goal: Will remain free from infection Outcome: Progressing Goal: Diagnostic test results will improve Outcome: Progressing Goal: Respiratory complications will improve Outcome: Progressing Goal: Cardiovascular complication will be avoided Outcome: Progressing   Problem: Activity: Goal: Risk for activity intolerance will decrease Outcome: Not Progressing   Problem: Nutrition: Goal: Adequate nutrition will be maintained Outcome: Not Progressing   Problem: Coping: Goal: Level of anxiety will decrease Outcome: Progressing   Problem: Elimination: Goal: Will not experience complications related to bowel motility Outcome: Progressing Goal: Will not experience complications related to urinary retention Outcome: Progressing   Problem: Pain Management: Goal: General experience of comfort will improve Outcome: Progressing   Problem: Safety: Goal: Ability to remain free from injury will improve Outcome: Progressing   Problem: Skin Integrity: Goal: Risk for impaired skin integrity will decrease Outcome: Progressing

## 2023-10-28 NOTE — Progress Notes (Signed)
PHARMACY - ANTICOAGULATION Pharmacy Consult for heparin Indication:  VTE treatment   Allergies  Allergen Reactions   Asa [Aspirin] Other (See Comments)    Stomach pain   Crestor [Rosuvastatin] Other (See Comments)    Stomach pain   Morphine Nausea And Vomiting   Wellbutrin [Bupropion] Other (See Comments)    Stomach pain   Zoloft [Sertraline Hcl] Other (See Comments)    Stomach pain    Patient Measurements: Height: 6' (182.9 cm) Weight: 84.4 kg (186 lb 1.1 oz) IBW/kg (Calculated) : 77.6 Heparin Dosing Weight: 88 kg  Vital Signs: Temp: 98.1 F (36.7 C) (11/10 1211) Temp Source: Oral (11/10 1211) BP: 160/78 (11/10 1725) Pulse Rate: 100 (11/10 0900)  Labs: Recent Labs    10/26/23 0619 10/26/23 1351 10/27/23 0513 10/27/23 0700 10/28/23 0338 10/28/23 0525 10/28/23 1542 10/28/23 1646  HGB 10.0* 12.2* 10.8*  --  10.7*  --   --   --   HCT 32.8* 36.0* 34.5*  --  35.2*  --   --   --   PLT 275  --  284  --  274  --   --   --   HEPARINUNFRC 0.10*  --   --    < > 0.20* 0.24*  --  >1.10*  CREATININE 1.28* 1.40* 1.14  --  0.98  --  1.01  --    < > = values in this interval not displayed.    Estimated Creatinine Clearance: 71.5 mL/min (by C-G formula based on SCr of 1.01 mg/dL).  Assessment: 72 y.o. male with possible PE for heparin.  Ultrasound negative for portal vein thrombosis. Hgb 10.7> 12.2> 10.8> 10.7 , stable  HL 0.33> 0.24- now subtherapeutic . No issues with infusion 11/10 1646 >1.1; ordered repeat level to confirm; per nurse was drawn from PICC  11/10 1934 0.29; lab draw, correlates more with previous levels  Goal of Therapy:  Heparin level 0.3-0.7 units/ml Monitor platelets by anticoagulation protocol: Yes   Plan:  Give 1300 bolus x 1 Increase heparin to1900 units/hr.  Heparin level ~8 hours and daily Continue to monitor CBC   Paulita Fujita, PharmD Clinical Pharmacist 10/28/2023 6:43 PM

## 2023-10-29 ENCOUNTER — Telehealth: Payer: Self-pay | Admitting: Gastroenterology

## 2023-10-29 ENCOUNTER — Inpatient Hospital Stay (HOSPITAL_COMMUNITY): Payer: 59

## 2023-10-29 DIAGNOSIS — Z789 Other specified health status: Secondary | ICD-10-CM | POA: Diagnosis not present

## 2023-10-29 DIAGNOSIS — I81 Portal vein thrombosis: Secondary | ICD-10-CM | POA: Diagnosis not present

## 2023-10-29 DIAGNOSIS — K311 Adult hypertrophic pyloric stenosis: Secondary | ICD-10-CM | POA: Diagnosis not present

## 2023-10-29 DIAGNOSIS — E43 Unspecified severe protein-calorie malnutrition: Secondary | ICD-10-CM | POA: Diagnosis not present

## 2023-10-29 LAB — GLUCOSE, CAPILLARY
Glucose-Capillary: 237 mg/dL — ABNORMAL HIGH (ref 70–99)
Glucose-Capillary: 244 mg/dL — ABNORMAL HIGH (ref 70–99)
Glucose-Capillary: 243 mg/dL — ABNORMAL HIGH (ref 70–99)
Glucose-Capillary: 269 mg/dL — ABNORMAL HIGH (ref 70–99)
Glucose-Capillary: 254 mg/dL — ABNORMAL HIGH (ref 70–99)

## 2023-10-29 LAB — CBC
HCT: 36.5 % — ABNORMAL LOW (ref 39.0–52.0)
Hemoglobin: 11.2 g/dL — ABNORMAL LOW (ref 13.0–17.0)
MCH: 27.5 pg (ref 26.0–34.0)
MCHC: 30.7 g/dL (ref 30.0–36.0)
MCV: 89.5 fL (ref 80.0–100.0)
Platelets: 227 10*3/uL (ref 150–400)
RBC: 4.08 MIL/uL — ABNORMAL LOW (ref 4.22–5.81)
RDW: 16.8 % — ABNORMAL HIGH (ref 11.5–15.5)
WBC: 8 10*3/uL (ref 4.0–10.5)
nRBC: 0 % (ref 0.0–0.2)

## 2023-10-29 LAB — PHOSPHORUS: Phosphorus: 2.2 mg/dL — ABNORMAL LOW (ref 2.5–4.6)

## 2023-10-29 LAB — HEPARIN LEVEL (UNFRACTIONATED)
Heparin Unfractionated: 0.39 [IU]/mL (ref 0.30–0.70)
Heparin Unfractionated: 0.23 [IU]/mL — ABNORMAL LOW (ref 0.30–0.70)
Heparin Unfractionated: 0.6 [IU]/mL (ref 0.30–0.70)

## 2023-10-29 LAB — COMPREHENSIVE METABOLIC PANEL
ALT: 12 U/L (ref 0–44)
AST: 16 U/L (ref 15–41)
Albumin: 3 g/dL — ABNORMAL LOW (ref 3.5–5.0)
Alkaline Phosphatase: 34 U/L — ABNORMAL LOW (ref 38–126)
Anion gap: 10 (ref 5–15)
BUN: 37 mg/dL — ABNORMAL HIGH (ref 8–23)
CO2: 18 mmol/L — ABNORMAL LOW (ref 22–32)
Calcium: 10.1 mg/dL (ref 8.9–10.3)
Chloride: 104 mmol/L (ref 98–111)
Creatinine, Ser: 1 mg/dL (ref 0.61–1.24)
GFR, Estimated: >60 mL/min (ref >60)
Glucose, Bld: 235 mg/dL — ABNORMAL HIGH (ref 70–99)
Potassium: 3.9 mmol/L (ref 3.5–5.1)
Sodium: 132 mmol/L — ABNORMAL LOW (ref 135–145)
Total Bilirubin: 0.5 mg/dL (ref <1.2)
Total Protein: 6.8 g/dL (ref 6.5–8.1)

## 2023-10-29 LAB — MAGNESIUM: Magnesium: 2.1 mg/dL (ref 1.7–2.4)

## 2023-10-29 LAB — TRIGLYCERIDES: Triglycerides: 179 mg/dL — ABNORMAL HIGH (ref <150)

## 2023-10-29 MED ORDER — TRACE MINERALS CU-MN-SE-ZN 300-55-60-3000 MCG/ML IV SOLN
INTRAVENOUS | Status: AC
  Filled 2023-10-29 (×2): qty 2000

## 2023-10-29 MED ORDER — FAT EMUL FISH OIL/PLANT BASED 20% (SMOFLIPID)IV EMUL
250.0000 mL | INTRAVENOUS | Status: AC
  Administered 2023-10-29: 250 mL via INTRAVENOUS
  Filled 2023-10-29: qty 250

## 2023-10-29 MED ORDER — INSULIN GLARGINE-YFGN 100 UNIT/ML ~~LOC~~ SOLN
24.0000 [IU] | Freq: Every day | SUBCUTANEOUS | Status: DC
Start: 2023-10-29 — End: 2023-10-30
  Administered 2023-10-29: 24 [IU] via SUBCUTANEOUS
  Filled 2023-10-29 (×3): qty 0.24

## 2023-10-29 MED ORDER — ACETAMINOPHEN 160 MG/5ML PO SOLN
650.0000 mg | Freq: Four times a day (QID) | ORAL | Status: DC
  Administered 2023-10-29 – 2023-10-30 (×5): 650 mg
  Filled 2023-10-29 (×5): qty 20.3

## 2023-10-29 MED ORDER — HEPARIN BOLUS VIA INFUSION
1000.0000 [IU] | Freq: Once | INTRAVENOUS | Status: AC
  Administered 2023-10-29: 1000 [IU] via INTRAVENOUS
  Filled 2023-10-29: qty 1000

## 2023-10-29 MED ORDER — DIATRIZOATE MEGLUMINE & SODIUM 66-10 % PO SOLN
270.0000 mL | Freq: Once | ORAL | Status: AC
  Administered 2023-10-29: 270 mL

## 2023-10-29 MED ORDER — SODIUM PHOSPHATES 45 MMOLE/15ML IV SOLN
20.0000 mmol | Freq: Once | INTRAVENOUS | Status: AC
  Administered 2023-10-29: 20 mmol via INTRAVENOUS
  Filled 2023-10-29: qty 6.67

## 2023-10-29 NOTE — Progress Notes (Signed)
PHARMACY - ANTICOAGULATION Pharmacy Consult for heparin Indication:  VTE treatment   Allergies  Allergen Reactions   Asa [Aspirin] Other (See Comments)    Stomach pain   Crestor [Rosuvastatin] Other (See Comments)    Stomach pain   Morphine Nausea And Vomiting   Wellbutrin [Bupropion] Other (See Comments)    Stomach pain   Zoloft [Sertraline Hcl] Other (See Comments)    Stomach pain    Patient Measurements: Height: 6' (182.9 cm) Weight: 87.2 kg (192 lb 3.9 oz) IBW/kg (Calculated) : 77.6 Heparin Dosing Weight: 88 kg  Vital Signs: Temp: 97.4 F (36.3 C) (11/11 1951) Temp Source: Oral (11/11 1826) BP: 134/99 (11/11 1951) Pulse Rate: 105 (11/11 1951)  Labs: Recent Labs    10/27/23 0513 10/27/23 0700 10/28/23 0338 10/28/23 0525 10/28/23 1542 10/28/23 1646 10/29/23 0402 10/29/23 1311 10/29/23 2212  HGB 10.8*  --  10.7*  --   --   --  11.2*  --   --   HCT 34.5*  --  35.2*  --   --   --  36.5*  --   --   PLT 284  --  274  --   --   --  227  --   --   HEPARINUNFRC  --    < > 0.20*   < >  --    < > 0.39 0.23* 0.60  CREATININE 1.14  --  0.98  --  1.01  --  1.00  --   --    < > = values in this interval not displayed.    Estimated Creatinine Clearance: 72.2 mL/min (by C-G formula based on SCr of 1 mg/dL).  Assessment: 72 y.o. male with possible PE for heparin.  Ultrasound negative for portal vein thrombosis. Hgb 10.7> 11.2 , stable  HL 0.23>0.6 therapeutic on 2050 units/hr. No signs/symptoms of bleeding per RN.  Goal of Therapy:  Heparin level 0.3-0.7 units/ml Monitor platelets by anticoagulation protocol: Yes   Plan:  Continue heparin to 2050 units/hr.  Confirmatory heparin level ~8 hours and daily Continue to monitor CBC (ordered)  Arabella Merles, PharmD. Clinical Pharmacist 10/29/2023 11:13 PM

## 2023-10-29 NOTE — Progress Notes (Signed)
PHARMACY - ANTICOAGULATION Pharmacy Consult for heparin Indication:  VTE treatment   Allergies  Allergen Reactions   Asa [Aspirin] Other (See Comments)    Stomach pain   Crestor [Rosuvastatin] Other (See Comments)    Stomach pain   Morphine Nausea And Vomiting   Wellbutrin [Bupropion] Other (See Comments)    Stomach pain   Zoloft [Sertraline Hcl] Other (See Comments)    Stomach pain    Patient Measurements: Height: 6' (182.9 cm) Weight: 87.2 kg (192 lb 3.9 oz) IBW/kg (Calculated) : 77.6 Heparin Dosing Weight: 88 kg  Vital Signs: Temp: 97.7 F (36.5 C) (11/11 1322) Temp Source: Oral (11/11 1322) BP: 138/96 (11/11 1322) Pulse Rate: 105 (11/11 1322)  Labs: Recent Labs    10/27/23 0513 10/27/23 0700 10/28/23 0338 10/28/23 0525 10/28/23 1542 10/28/23 1646 10/28/23 1934 10/29/23 0402 10/29/23 1311  HGB 10.8*  --  10.7*  --   --   --   --  11.2*  --   HCT 34.5*  --  35.2*  --   --   --   --  36.5*  --   PLT 284  --  274  --   --   --   --  227  --   HEPARINUNFRC  --    < > 0.20*   < >  --    < > 0.29* 0.39 0.23*  CREATININE 1.14  --  0.98  --  1.01  --   --  1.00  --    < > = values in this interval not displayed.    Estimated Creatinine Clearance: 72.2 mL/min (by C-G formula based on SCr of 1 mg/dL).  Assessment: 72 y.o. male with possible PE for heparin.  Ultrasound negative for portal vein thrombosis. Hgb 10.7> 12.2> 10.8> 10.7 , stable  HL 0.29> 0.39> 0.23 subtherapeutic on 1900 units/hr. No signs/symptoms of bleeding per RN  Goal of Therapy:  Heparin level 0.3-0.7 units/ml Monitor platelets by anticoagulation protocol: Yes   Plan:  Bolus Heparin 1000 units then  Increase heparin to 2050 units/hr.  Heparin level ~8 hours and daily Continue to monitor CBC (ordered)  Elder Cyphers, BS Pharm D, BCPS Clinical Pharmacist 10/29/2023 2:28 PM

## 2023-10-29 NOTE — Telephone Encounter (Signed)
Patient needs a hospital follow up in 6 weeks with [facility]. Patient will need dedicated imaging of the pancreas. He is still currently inpatient.

## 2023-10-29 NOTE — Progress Notes (Signed)
[facility] Progress Note  3 Days Post-Op  Subjective: Patient seen and examined.  He is resting comfortably in bed.  He currently is denying any abdominal pain.  His NG tube has had 2.3 L in last 24 hours with 800 in the last 12 hours.  He also had a bowel movement overnight.  Objective: Vital signs in last 24 hours: Temp:  [97 F (36.1 C)-98.1 F (36.7 C)] 97.7 F (36.5 C) (11/11 0739) Pulse Rate:  [84-113] 84 (11/11 0700) Resp:  [19-27] 23 (11/11 0700) BP: (118-160)/(78-104) 146/98 (11/11 0700) SpO2:  [91 %-100 %] 97 % (11/11 0845) Weight:  [87.2 kg] 87.2 kg (11/11 0222) Last BM Date : 10/28/23  Intake/Output from previous day: 11/10 0701 - 11/11 0700 In: 3139.8 [I.V.:2605.5; NG/GT:60; IV Piggyback:474.3] Out: 3600 [Urine:1250; Emesis/NG output:2350] Intake/Output this shift: No intake/output data recorded.  General appearance: alert, cooperative, and no distress GI: Abdomen soft, nondistended, no percussion tenderness, nontender to palpation; no rigidity, guarding, rebound tenderness; midline incision C/D/I with honeycomb dressing in place  Lab Results:  Recent Labs    10/28/23 0338 10/29/23 0402  WBC 8.9 8.0  HGB 10.7* 11.2*  HCT 35.2* 36.5*  PLT 274 227   BMET Recent Labs    10/28/23 1542 10/29/23 0402  NA 132* 132*  K 3.5 3.9  CL 100 104  CO2 21* 18*  GLUCOSE 270* 235*  BUN 35* 37*  CREATININE 1.01 1.00  CALCIUM 10.1 10.1   PT/INR No results for input(s): "LABPROT", "INR" in the last 72 hours.  Studies/Results: ECHOCARDIOGRAM COMPLETE  Result Date: 10/27/2023    ECHOCARDIOGRAM REPORT   Patient Name:   Matthew Molina Date of Exam: 10/27/2023 Medical Rec #:  [id number]      Height:       72.0 in Accession #:    [id number]     Weight:       188.1 lb Date of Birth:  Nov 01, 1951      BSA:          2.076 m Patient Age:    72 years       BP:           166/92 mmHg Patient Gender: M              HR:           90 bpm. [facility]  Procedure: 2D Echo, Cardiac Doppler, Color Doppler and Intracardiac            Opacification Agent Indications:    Pulmonary embolus  History:        Patient has no prior history of Echocardiogram examinations.                 CKD3 and COPD; Risk Factors:Current Smoker, Hypertension and                 Dyslipidemia.  Sonographer:    Dondra Prader RVT RCS Referring Phys: [id number] Marsena Taff A Carrissa Taitano  Sonographer Comments: Technically challenging study due to limited acoustic windows, suboptimal subcostal window and suboptimal apical window. Image acquisition challenging due to respiratory motion, unable to breath hold and Image acquisition challenging due to COPD. IMPRESSIONS  1. Technically difficult study.  2. Left ventricular ejection fraction, by estimation, is >75%. The left ventricle has normal function. There is moderate left ventricular hypertrophy. Left ventricular diastolic parameters were normal.  3. Right ventricular is not well-seen. Right ventricular size grossly normal, systolic function visually preserved.  4. Right atrial size was visually normal.  5. The mitral valve is grossly normal. No evidence of mitral valve regurgitation. No evidence of mitral stenosis.  6. The aortic valve is tricuspid. Aortic valve regurgitation is not visualized. No aortic stenosis is present.  7. There is dilatation of the ascending aorta, measuring 41 mm.  8. The inferior vena cava is dilated in size with >50% respiratory variability, suggesting right atrial pressure of 8 mmHg. Comparison(s): No prior Echocardiogram. FINDINGS  Left Ventricle: Left ventricular ejection fraction, by estimation, is >75%. The left ventricle has normal function. Definity contrast agent was given IV to delineate the left ventricular endocardial borders. The left ventricular internal cavity size was  small. There is moderate left ventricular hypertrophy. Left ventricular diastolic parameters were normal. Right Ventricle: Right ventricular is not  well-seen. Right ventricular size grossly normal, systolic function visually preserved. Left Atrium: Left atrial size was normal in size. Right Atrium: Right atrial size was visually normal. Pericardium: There is no evidence of pericardial effusion. Mitral Valve: The mitral valve is grossly normal. No evidence of mitral valve regurgitation. No evidence of mitral valve stenosis. Tricuspid Valve: The tricuspid valve is normal in structure. Tricuspid valve regurgitation is not demonstrated. No evidence of tricuspid stenosis. Aortic Valve: The aortic valve is tricuspid. Aortic valve regurgitation is not visualized. No aortic stenosis is present. Aortic valve mean gradient measures 3.0 mmHg. Aortic valve peak gradient measures 5.9 mmHg. Aortic valve area, by VTI measures 2.87 cm. Pulmonic Valve: The pulmonic valve was normal in structure. Pulmonic valve regurgitation is not visualized. No evidence of pulmonic stenosis. Aorta: The aortic root is normal in size and structure. There is dilatation of the ascending aorta, measuring 41 mm. Venous: The inferior vena cava is dilated in size with greater than 50% respiratory variability, suggesting right atrial pressure of 8 mmHg. IAS/Shunts: The interatrial septum was not well visualized.  LEFT VENTRICLE PLAX 2D LVIDd:         3.70 cm   Diastology LVIDs:         2.10 cm   LV e' medial:    3.57 cm/s LV PW:         1.60 cm   LV E/e' medial:  12.3 LV IVS:        1.30 cm   LV e' lateral:   4.14 cm/s LVOT diam:     2.10 cm   LV E/e' lateral: 10.6 LV SV:         53 LV SV Index:   25 LVOT Area:     3.46 cm  RIGHT VENTRICLE             IVC RV S prime:     11.40 cm/s  IVC diam: 2.10 cm LEFT ATRIUM             Index LA diam:        3.30 cm 1.59 cm/m LA Vol (A2C):   36.6 ml 17.63 ml/m LA Vol (A4C):   37.6 ml 18.12 ml/m LA Biplane Vol: 39.6 ml 19.08 ml/m  AORTIC VALVE                    PULMONIC VALVE AV Area (Vmax):    3.14 cm     PV Vmax:       0.70 m/s AV Area (Vmean):   2.99 cm      PV Peak grad:  2.0 mmHg AV Area (VTI):     2.87 cm AV  Vmax:           121.00 cm/s AV Vmean:          77.033 cm/s AV VTI:            0.183 m AV Peak Grad:      5.9 mmHg AV Mean Grad:      3.0 mmHg LVOT Vmax:         109.67 cm/s LVOT Vmean:        66.533 cm/s LVOT VTI:          0.152 m LVOT/AV VTI ratio: 0.83  AORTA Ao Root diam: 3.70 cm Ao Asc diam:  4.10 cm MITRAL VALVE MV Area (PHT): 3.77 cm     SHUNTS MV Decel Time: 201 msec     Systemic VTI:  0.15 m MV E velocity: 44.00 cm/s   Systemic Diam: 2.10 cm MV A velocity: 110.00 cm/s MV E/A ratio:  0.40 Sunit Tolia Electronically signed by Tessa Lerner Signature Date/Time: 10/27/2023/2:57:03 PM    Final     Anti-infectives: Anti-infectives (From admission, onward)    Start     Dose/Rate Route Frequency Ordered Stop   10/26/23 1400  ceFAZolin (ANCEF) IVPB 2g/100 mL premix        2 g 200 mL/hr over 30 Minutes Intravenous On call to O.R. 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  He is status post open gastrojejunostomy on 11/8.   -Patient with no leukocytosis.  Hemoglobin remained stable -Maintain NG tube to LIS -NPO -Upper GI ordered, as patient's NG tube output has slightly decreased in the last 12 hours -Further recommendations to follow imaging -TPN per hospitalist -Okay to continue heparin drip -Appreciate hospitalist recommendations   LOS: 6 days    Raevyn Sokol A Miaa Latterell 10/29/2023

## 2023-10-29 NOTE — Progress Notes (Signed)
PROGRESS NOTE   Matthew Molina  ZOX:[id number] DOB: June 15, 1951 DOA: 10/23/2023 PCP: Kerri Perches, MD   No chief complaint on file.  Level of care: Telemetry  Brief Admission History:  72 year old male with a history of IgG MGUS, COPD, diabetes mellitus type 2, hypertension, hyperlipidemia, depression, tobacco abuse, coronary disease presenting with generalized weakness, abdominal pain, nausea and vomiting. The patient was recently admitted to the hospital from 09/30/2023 to 10/03/2023.  At that time, the patient had nausea and vomiting and poor oral intake and upper abdominal pain as well.  The patient was treated for sepsis secondary to aspiration pneumonia.  GI was consulted during the admission.  The patient underwent EGD on 10/02/2023 which showed grade B esophagitis without bleeding, nonbleeding gastric ulcers with a clean base, and duodenitis.  The patient was discharged home with PPI twice daily and sulcrafate.  The patient states that he was feeling well after d/c and he was tolerating a soft diet. However, he but began having gradual abdominal pain and belching in the past 1 week.  He states that things have gradually worsened over the past 3 days prior to admission with increasing belching and hiccups that has resulted in vomiting every time he belches.  He has had poor oral intake and progressive generalized weakness.  He also complains of worsening mid abdominal pain.  He denies f/c, cp, sob, hemoptysis, diarrhea, hematochezia, melena, dysuria, hematuria.  As result of abd pain with n/v, the patient presented for further evaluation and treatment.  He continues to smoke a few cigarette's a day. Notably, patient states he ate spaghetti and chocolate chip cookies before he came to ED and did not have any emesis.   In the ED, the patient was afebrile hemodynamically stable with oxygen saturation 92% on room air.  WBC 6.0, hemoglobin 11.1, platelets 332.  Sodium 132, potassium 3.1,  bicarbonate 28, serum creatinine 1.53.  LFTs were unremarkable.  Lipase 84.  CT of the abdomen and pelvis showed marked distention of the stomach to the level of the duodenum where there is circumferential thickening and heterogenous hypoattenuation at the site of the patient's previous duodenitis. There was a new hypodensity upstream portal vein suspicious for nonocclusive thrombus.  There is also a partially imaged hypodensity in the right upper lobe pulmonary artery which may represent artifact versus PE. The patient was started on IV heparin. -GI was consulted to assist with management.   Assessment and Plan:  Severe distal duodenal obstruction  - GI recommended CTA abdomen completed to rule out ischemic process, consult general surgery for gastrojejunostomy for drainage  - NG tube in place 60 cc out reported last 24 hours - continue pantoprazole  - Postop s/p gastrojejunostomy on 10/26/23  - pharm D consulted to start TPN on 11/9 - PICC line placed  - further recommendations to follow  - upper GI planned soon per surgery to check for leaks   Question of portal venous thrombosis Question of PE - CTA chest/abd/pelvis unfortunately not able to evaluate for PE given timing of contrast bolus given  - liver doppler without evidence of portal vein thrombosis  - IV heparin infusion resumed for full dose anticoagulation - D dimer markedly elevated at 3.65 so we presume he does have a PE - check venous dopplers of legs to assess for DVT -- negative for DVT - check TTE to evaluate for right heart strain : Left ventricular ejection fraction, by estimation, is >75%. The left ventricle has normal function. There  is moderate left ventricular hypertrophy. Left ventricular diastolic parameters were normal. This was a technically difficult study.    CKD stage IIIa -Baseline creatinine 1.2-1.5 -Monitor daily BMP   Hypokalemia - repleted  - IV replacement ordered - Mg replaced - recheck K at 2pm and  give additional IV K if needed - discussed with pharm D  COPD -Stable on room air -Restart BDs -Continue Singulair when able to tolerate p.o.   Tobacco abuse -Tobacco cessation discussed   Depression/anxiety -restart risperdal and buspar when able to tolerate po   Controlled diabetes mellitus type 2 TPN ASSOCIATED HYPERGLYCEMIA -increased semglee to 24 units daily and monitor to titrate -continue resistant SSI coverage  -10/07/2023 hemoglobin A1c 6.0   BPH -Restart tamsulosin when able to tolerate p.o   DVT prophylaxis:  SCDs Code Status: FULL  Family Communication:  Disposition: transfer to telemetry    Consultants:  GI   Procedures:  EGD 10/24/23   Antimicrobials:    Subjective: Still reporting abdominal incision discomfort; irritation from NG tube.  Otherwise no other complaints today.    Objective: Vitals:   10/29/23 0739 10/29/23 0837 10/29/23 0839 10/29/23 0845  BP:      Pulse:      Resp:      Temp: 97.7 F (36.5 C)     TempSrc: Oral     SpO2:  94% 97% 97%  Weight:      Height:        Intake/Output Summary (Last 24 hours) at 10/29/2023 0950 Last data filed at 10/29/2023 0600 Gross per 24 hour  Intake 3129.75 ml  Output 3600 ml  Net -470.25 ml   Filed Weights   10/27/23 0500 10/28/23 0500 10/29/23 0222  Weight: 85.3 kg 84.4 kg 87.2 kg   Examination:  General exam: awake, alert, moderately stressed.  NG tube in place.   Respiratory system: BBS heard. No rales. Cardiovascular system: normal S1 & S2 heard. No JVD, murmurs, rubs, gallops or clicks. No pedal edema. Gastrointestinal system: Abdomen is distended, soft, tender to light palpation.  No organomegaly or masses felt. Normal bowel sounds heard. Central nervous system: awake, alert, cooperative.  No focal neurological deficits. Extremities: Symmetric 5 x 5 power. Skin: No rashes, lesions or ulcers. Psychiatry: Judgement and insight UTD   Data Reviewed: I have personally reviewed  following labs and imaging studies  CBC: Recent Labs  Lab 10/23/23 1000 10/24/23 0002 10/25/23 0441 10/26/23 0619 10/26/23 1351 10/27/23 0513 10/28/23 0338 10/29/23 0402  WBC 6.0   < > 5.4 5.5  --  11.6* 8.9 8.0  NEUTROABS 4.3  --   --   --   --   --   --   --   HGB 11.1*   < > 10.7* 10.0* 12.2* 10.8* 10.7* 11.2*  HCT 35.2*   < > 35.3* 32.8* 36.0* 34.5* 35.2* 36.5*  MCV 89.6   < > 93.4 89.1  --  90.1 89.3 89.5  PLT 332   < > 257 275  --  284 274 227   < > = values in this interval not displayed.    Basic Metabolic Panel: Recent Labs  Lab 10/25/23 0441 10/26/23 0619 10/26/23 1351 10/27/23 0513 10/28/23 0338 10/28/23 1349 10/28/23 1542 10/29/23 0402  NA 136 136 137 133* 133*  --  132* 132*  K 3.5 2.5* 3.7 2.6* 2.7* 6.0* 3.5 3.9  CL 101 99 97* 100 101  --  100 104  CO2 19* 27  --  23 24  --  21* 18*  GLUCOSE 109* 91 97 199* 216*  --  270* 235*  BUN 20 17 18 19  29*  --  35* 37*  CREATININE 1.26* 1.28* 1.40* 1.14 0.98  --  1.01 1.00  CALCIUM 9.6 9.8  --  9.2 9.5  --  10.1 10.1  MG 1.9 1.7  --  2.1 2.1  --   --  2.1  PHOS  --  2.4*  --  2.7  --   --   --  2.2*    CBG: Recent Labs  Lab 10/28/23 1632 10/28/23 1951 10/28/23 2313 10/29/23 0331 10/29/23 0738  GLUCAP 272* 193* 245* 237* 244*    Recent Results (from the past 240 hour(s))  MRSA Next Gen by PCR, Nasal     Status: None   Collection Time: 10/24/23  4:00 PM   Specimen: Nasal Mucosa; Nasal Swab  Result Value Ref Range Status   MRSA by PCR Next Gen NOT DETECTED NOT DETECTED Final    Comment: (NOTE) The GeneXpert MRSA Assay (FDA approved for NASAL specimens only), is one component of a comprehensive MRSA colonization surveillance program. It is not intended to diagnose MRSA infection nor to guide or monitor treatment for MRSA infections. Test performance is not FDA approved in patients less than 49 years old. Performed at [facility], [address], [place], [place] [address]      Radiology  Studies: ECHOCARDIOGRAM COMPLETE  Result Date: 10/27/2023    ECHOCARDIOGRAM REPORT   Patient Name:   DIMAS BOMBA Date of Exam: 10/27/2023 Medical Rec #:  [id number]      Height:       72.0 in Accession #:    [id number]     Weight:       188.1 lb Date of Birth:  November 03, 1951      BSA:          2.076 m Patient Age:    72 years       BP:           166/92 mmHg Patient Gender: M              HR:           90 bpm. [facility] Procedure: 2D Echo, Cardiac Doppler, Color Doppler and Intracardiac            Opacification Agent Indications:    Pulmonary embolus  History:        Patient has no prior history of Echocardiogram examinations.                 CKD3 and COPD; Risk Factors:Current Smoker, Hypertension and                 Dyslipidemia.  Sonographer:    Dondra Prader RVT RCS Referring Phys: [id number] CATHERINE A PAPPAYLIOU  Sonographer Comments: Technically challenging study due to limited acoustic windows, suboptimal subcostal window and suboptimal apical window. Image acquisition challenging due to respiratory motion, unable to breath hold and Image acquisition challenging due to COPD. IMPRESSIONS  1. Technically difficult study.  2. Left ventricular ejection fraction, by estimation, is >75%. The left ventricle has normal function. There is moderate left ventricular hypertrophy. Left ventricular diastolic parameters were normal.  3. Right ventricular is not well-seen. Right ventricular size grossly normal, systolic function visually preserved.  4. Right atrial size was visually normal.  5. The mitral valve is grossly normal. No evidence of mitral valve  regurgitation. No evidence of mitral stenosis.  6. The aortic valve is tricuspid. Aortic valve regurgitation is not visualized. No aortic stenosis is present.  7. There is dilatation of the ascending aorta, measuring 41 mm.  8. The inferior vena cava is dilated in size with >50% respiratory variability, suggesting right atrial pressure of 8 mmHg.  Comparison(s): No prior Echocardiogram. FINDINGS  Left Ventricle: Left ventricular ejection fraction, by estimation, is >75%. The left ventricle has normal function. Definity contrast agent was given IV to delineate the left ventricular endocardial borders. The left ventricular internal cavity size was  small. There is moderate left ventricular hypertrophy. Left ventricular diastolic parameters were normal. Right Ventricle: Right ventricular is not well-seen. Right ventricular size grossly normal, systolic function visually preserved. Left Atrium: Left atrial size was normal in size. Right Atrium: Right atrial size was visually normal. Pericardium: There is no evidence of pericardial effusion. Mitral Valve: The mitral valve is grossly normal. No evidence of mitral valve regurgitation. No evidence of mitral valve stenosis. Tricuspid Valve: The tricuspid valve is normal in structure. Tricuspid valve regurgitation is not demonstrated. No evidence of tricuspid stenosis. Aortic Valve: The aortic valve is tricuspid. Aortic valve regurgitation is not visualized. No aortic stenosis is present. Aortic valve mean gradient measures 3.0 mmHg. Aortic valve peak gradient measures 5.9 mmHg. Aortic valve area, by VTI measures 2.87 cm. Pulmonic Valve: The pulmonic valve was normal in structure. Pulmonic valve regurgitation is not visualized. No evidence of pulmonic stenosis. Aorta: The aortic root is normal in size and structure. There is dilatation of the ascending aorta, measuring 41 mm. Venous: The inferior vena cava is dilated in size with greater than 50% respiratory variability, suggesting right atrial pressure of 8 mmHg. IAS/Shunts: The interatrial septum was not well visualized.  LEFT VENTRICLE PLAX 2D LVIDd:         3.70 cm   Diastology LVIDs:         2.10 cm   LV e' medial:    3.57 cm/s LV PW:         1.60 cm   LV E/e' medial:  12.3 LV IVS:        1.30 cm   LV e' lateral:   4.14 cm/s LVOT diam:     2.10 cm   LV E/e'  lateral: 10.6 LV SV:         53 LV SV Index:   25 LVOT Area:     3.46 cm  RIGHT VENTRICLE             IVC RV S prime:     11.40 cm/s  IVC diam: 2.10 cm LEFT ATRIUM             Index LA diam:        3.30 cm 1.59 cm/m LA Vol (A2C):   36.6 ml 17.63 ml/m LA Vol (A4C):   37.6 ml 18.12 ml/m LA Biplane Vol: 39.6 ml 19.08 ml/m  AORTIC VALVE                    PULMONIC VALVE AV Area (Vmax):    3.14 cm     PV Vmax:       0.70 m/s AV Area (Vmean):   2.99 cm     PV Peak grad:  2.0 mmHg AV Area (VTI):     2.87 cm AV Vmax:           121.00 cm/s AV Vmean:  77.033 cm/s AV VTI:            0.183 m AV Peak Grad:      5.9 mmHg AV Mean Grad:      3.0 mmHg LVOT Vmax:         109.67 cm/s LVOT Vmean:        66.533 cm/s LVOT VTI:          0.152 m LVOT/AV VTI ratio: 0.83  AORTA Ao Root diam: 3.70 cm Ao Asc diam:  4.10 cm MITRAL VALVE MV Area (PHT): 3.77 cm     SHUNTS MV Decel Time: 201 msec     Systemic VTI:  0.15 m MV E velocity: 44.00 cm/s   Systemic Diam: 2.10 cm MV A velocity: 110.00 cm/s MV E/A ratio:  0.40 Sunit Tolia Electronically signed by [employer] Signature Date/Time: 10/27/2023/2:57:03 PM    Final     Scheduled Meds:  acetaminophen  1,000 mg Per Tube Q6H   arformoterol  15 mcg Nebulization BID   bisacodyl  10 mg Rectal Daily   budesonide (PULMICORT) nebulizer solution  0.5 mg Nebulization BID   busPIRone  7.5 mg Per Tube TID   Chlorhexidine Gluconate Cloth  6 each Topical Daily   docusate  100 mg Per Tube BID   doxazosin  1 mg Per Tube q1800   insulin aspart  0-20 Units Subcutaneous Q4H   insulin glargine-yfgn  24 Units Subcutaneous Daily   ipratropium-albuterol  3 mL Nebulization TID   metoprolol tartrate  5 mg Intravenous Q6H   montelukast  10 mg Per Tube QHS   pantoprazole (PROTONIX) IV  40 mg Intravenous Q12H   polyethylene glycol  17 g Per Tube Daily   risperiDONE  1 mg Per Tube QHS   sodium chloride flush  10-40 mL Intracatheter Q12H   sucralfate  1 g Per Tube TID WC & HS   Continuous  Infusions:  .TPN (CLINIMIX-E) Adult 84 mL/hr at 10/28/23 1847   .TPN (CLINIMIX-E) Adult     And   fat emul(SMOFlipid)     heparin 1,900 Units/hr (10/28/23 2103)   sodium phosphate 20 mmol in dextrose 5 % 250 mL infusion       LOS: 6 days   Time spent: 56 mins  Lew Prout Laural Benes, MD How to contact the [facility] Attending or Consulting provider 7A - 7P or covering provider during after hours 7P -7A, for this patient?  Check the care team in [facility] and look for a) attending/consulting [facility] provider listed and b) the [facility] team listed Log into [email or web address] to find provider on call.  Locate the [facility] provider you are looking for under [facility] and page to a number that you can be directly reached. If you still have difficulty reaching the provider, please page the [facility] (Director on Call) for the Hospitalists listed on amion for assistance.  10/29/2023, 9:50 AM

## 2023-10-29 NOTE — Progress Notes (Signed)
   10/28/23 2100  Stool Characteristics  Bowel Incontinence Yes  Stool Type Type 1 (Separate hard lumps) (Pt had hard stool with some mucus)  Has the patient had three Type 7 stools in the last 24 hours? No  Stool Descriptors Brown  Stool Amount Small  Stool Source Rectum

## 2023-10-29 NOTE — Progress Notes (Signed)
Report given to [facility] on 300

## 2023-10-29 NOTE — Progress Notes (Addendum)
PHARMACY - TOTAL PARENTERAL NUTRITION CONSULT NOTE   Indication:  Gastric outlet obstruction  Patient Measurements: Height: 6' (182.9 cm) Weight: 87.2 kg (192 lb 3.9 oz) IBW/kg (Calculated) : 77.6 TPN AdjBW (KG): 87.2 Body mass index is 26.07 kg/m.   Assessment: Patient with gastric outlet obstruction that  is preventing him from passing any food or drink and resulting with his abdominal distention which is causing pain, nausea, and vomiting. Since this area of his duodenum has been inflamed for such a significant period of time and has caused secondary complications.  Gastrojejunostomy to bypass gastric outlet obstruction surgery completed 11/8. Plan to perform upper GI Monday (11/11) to evaluate for any leak depending on NGTube output which has been high.  Glucose / Insulin: BS 235-270. Novolog 41 units given, semglee dose adjusted Electrolytes: Na 137> 133> 133 K 2.5 > 3.7> 2.5 > 2.6> 2.7(KCL IV x 4 runs ordered)> 3.9 Phos 2.4> 2.7> 2.2 Mag 1.7 (4gm IV 11/8)> 2.1 Renal: stable Hepatic: TG 125> 179 Intake / Output;  3139/3600(NGT) GI Imaging:EGD 11/6 showed distal duodenal obstruction.  GI Surgeries / Procedures:   Central access: 11/8 PICC line TPN start date: 11/8  Nutritional Goals: Goal TPN rate is 84 mL/hr (provides 161 g of protein and 1810 kcals per day)  RD Assessment: Estimated Needs Total Energy Estimated Needs: 2300-2600 Total Protein Estimated Needs: 120-135 gm Total Fluid Estimated Needs: 2.3-2.6 L  Current Nutrition:  NPO  Plan:  Continue TPN to 54mL/hr at 1800 + lipids 20% infused over 12 hours daily NaPhos x 1 today Electrolytes in TPN: Na 35 mEq/L, K 30 mEq/L, Ca 4.28mEq/L, Mg 38mEq/L, and Phos 52mmol/L. Cl:Ac 1:1 Add standard MVI and trace elements to TPN Initiate Sensitive q4h SSI and adjust as needed . (Semglee 24 units per MD orders) Monitor TPN labs on Mon/Thurs and tomorrow  Elder Cyphers, BS Pharm D, BCPS Clinical  Pharmacist 10/29/2023,8:09 AM

## 2023-10-29 NOTE — Progress Notes (Signed)
PHARMACY - ANTICOAGULATION Pharmacy Consult for heparin Indication:  VTE treatment   Allergies  Allergen Reactions   Asa [Aspirin] Other (See Comments)    Stomach pain   Crestor [Rosuvastatin] Other (See Comments)    Stomach pain   Morphine Nausea And Vomiting   Wellbutrin [Bupropion] Other (See Comments)    Stomach pain   Zoloft [Sertraline Hcl] Other (See Comments)    Stomach pain    Patient Measurements: Height: 6' (182.9 cm) Weight: 87.2 kg (192 lb 3.9 oz) IBW/kg (Calculated) : 77.6 Heparin Dosing Weight: 88 kg  Vital Signs: Temp: 97.8 F (36.6 C) (11/11 0332) Temp Source: Oral (11/11 0332) BP: 119/80 (11/11 0402) Pulse Rate: 96 (11/11 0402)  Labs: Recent Labs    10/26/23 0619 10/26/23 1351 10/27/23 0513 10/27/23 0700 10/28/23 0338 10/28/23 0525 10/28/23 1542 10/28/23 1646 10/28/23 1934 10/29/23 0402  HGB 10.0* 12.2* 10.8*  --  10.7*  --   --   --   --   --   HCT 32.8* 36.0* 34.5*  --  35.2*  --   --   --   --   --   PLT 275  --  284  --  274  --   --   --   --   --   HEPARINUNFRC 0.10*  --   --    < > 0.20*   < >  --  >1.10* 0.29* 0.39  CREATININE 1.28* 1.40* 1.14  --  0.98  --  1.01  --   --   --    < > = values in this interval not displayed.    Estimated Creatinine Clearance: 71.5 mL/min (by C-G formula based on SCr of 1.01 mg/dL).  Assessment: 72 y.o. male with possible PE for heparin.  Ultrasound negative for portal vein thrombosis. Hgb 10.7> 12.2> 10.8> 10.7 , stable  HL 0.33> 0.24- now subtherapeutic . No issues with infusion 11/10 1646 >1.1; ordered repeat level to confirm; per nurse was drawn from PICC  11/10 1934 0.29; lab draw, correlates more with previous levels 11/11 0402 0.39; lab draw; therapeutic on 1900 units/hr. No signs/symptoms of bleeding per RN  Goal of Therapy:  Heparin level 0.3-0.7 units/ml Monitor platelets by anticoagulation protocol: Yes   Plan:  Continue heparin to1900 units/hr.  Heparin level ~8 hours and  daily Continue to monitor CBC (ordered)  Arabella Merles, PharmD. Clinical Pharmacist 10/29/2023 5:44 AM

## 2023-10-30 ENCOUNTER — Inpatient Hospital Stay (HOSPITAL_COMMUNITY): Payer: 59

## 2023-10-30 ENCOUNTER — Inpatient Hospital Stay: Payer: 59 | Admitting: Physician Assistant

## 2023-10-30 ENCOUNTER — Other Ambulatory Visit: Payer: Self-pay

## 2023-10-30 ENCOUNTER — Encounter (HOSPITAL_COMMUNITY): Payer: Self-pay | Admitting: Internal Medicine

## 2023-10-30 DIAGNOSIS — K311 Adult hypertrophic pyloric stenosis: Secondary | ICD-10-CM | POA: Diagnosis not present

## 2023-10-30 DIAGNOSIS — I81 Portal vein thrombosis: Secondary | ICD-10-CM | POA: Diagnosis not present

## 2023-10-30 DIAGNOSIS — Z789 Other specified health status: Secondary | ICD-10-CM | POA: Diagnosis not present

## 2023-10-30 DIAGNOSIS — E43 Unspecified severe protein-calorie malnutrition: Secondary | ICD-10-CM | POA: Diagnosis not present

## 2023-10-30 LAB — BASIC METABOLIC PANEL
Anion gap: 12 (ref 5–15)
BUN: 50 mg/dL — ABNORMAL HIGH (ref 8–23)
CO2: 18 mmol/L — ABNORMAL LOW (ref 22–32)
Calcium: 11 mg/dL — ABNORMAL HIGH (ref 8.9–10.3)
Chloride: 102 mmol/L (ref 98–111)
Creatinine, Ser: 1.18 mg/dL (ref 0.61–1.24)
GFR, Estimated: >60 mL/min (ref >60)
Glucose, Bld: 316 mg/dL — ABNORMAL HIGH (ref 70–99)
Potassium: 3.6 mmol/L (ref 3.5–5.1)
Sodium: 132 mmol/L — ABNORMAL LOW (ref 135–145)

## 2023-10-30 LAB — CBC
HCT: 36.3 % — ABNORMAL LOW (ref 39.0–52.0)
Hemoglobin: 11.4 g/dL — ABNORMAL LOW (ref 13.0–17.0)
MCH: 27.6 pg (ref 26.0–34.0)
MCHC: 31.4 g/dL (ref 30.0–36.0)
MCV: 87.9 fL (ref 80.0–100.0)
Platelets: 344 10*3/uL (ref 150–400)
RBC: 4.13 MIL/uL — ABNORMAL LOW (ref 4.22–5.81)
RDW: 17 % — ABNORMAL HIGH (ref 11.5–15.5)
WBC: 10 10*3/uL (ref 4.0–10.5)
nRBC: 0.2 % (ref 0.0–0.2)

## 2023-10-30 LAB — GLUCOSE, CAPILLARY
Glucose-Capillary: 156 mg/dL — ABNORMAL HIGH (ref 70–99)
Glucose-Capillary: 260 mg/dL — ABNORMAL HIGH (ref 70–99)
Glucose-Capillary: 283 mg/dL — ABNORMAL HIGH (ref 70–99)
Glucose-Capillary: 286 mg/dL — ABNORMAL HIGH (ref 70–99)
Glucose-Capillary: 294 mg/dL — ABNORMAL HIGH (ref 70–99)
Glucose-Capillary: 306 mg/dL — ABNORMAL HIGH (ref 70–99)
Glucose-Capillary: 318 mg/dL — ABNORMAL HIGH (ref 70–99)

## 2023-10-30 LAB — PHOSPHORUS: Phosphorus: 3.4 mg/dL (ref 2.5–4.6)

## 2023-10-30 LAB — HEPARIN LEVEL (UNFRACTIONATED): Heparin Unfractionated: 0.52 [IU]/mL (ref 0.30–0.70)

## 2023-10-30 MED ORDER — ACETAMINOPHEN 160 MG/5ML PO SOLN
650.0000 mg | Freq: Four times a day (QID) | ORAL | Status: DC
  Administered 2023-10-30 – 2023-11-01 (×4): 650 mg via ORAL
  Filled 2023-10-30 (×4): qty 20.3

## 2023-10-30 MED ORDER — INSULIN REGULAR HUMAN 100 UNIT/ML IJ SOLN
INTRAVENOUS | Status: AC
  Filled 2023-10-30: qty 2000

## 2023-10-30 MED ORDER — DOCUSATE SODIUM 100 MG PO CAPS
100.0000 mg | ORAL_CAPSULE | Freq: Two times a day (BID) | ORAL | Status: DC
  Administered 2023-10-31: 100 mg via ORAL
  Filled 2023-10-30 (×2): qty 1

## 2023-10-30 MED ORDER — BISACODYL 5 MG PO TBEC
10.0000 mg | DELAYED_RELEASE_TABLET | Freq: Every day | ORAL | Status: DC
  Administered 2023-10-31: 10 mg via ORAL
  Filled 2023-10-30: qty 2

## 2023-10-30 MED ORDER — FAT EMUL FISH OIL/PLANT BASED 20% (SMOFLIPID)IV EMUL
250.0000 mL | INTRAVENOUS | Status: AC
  Administered 2023-10-30: 250 mL via INTRAVENOUS
  Filled 2023-10-30: qty 250

## 2023-10-30 MED ORDER — POLYETHYLENE GLYCOL 3350 17 G PO PACK
17.0000 g | PACK | Freq: Every day | ORAL | Status: DC
  Administered 2023-10-31: 17 g via ORAL
  Filled 2023-10-30: qty 1

## 2023-10-30 MED ORDER — OXYCODONE HCL 5 MG PO TABS
5.0000 mg | ORAL_TABLET | Freq: Four times a day (QID) | ORAL | Status: DC | PRN
  Administered 2023-10-30: 5 mg via ORAL
  Filled 2023-10-30: qty 1

## 2023-10-30 MED ORDER — RISPERIDONE 1 MG PO TABS
1.0000 mg | ORAL_TABLET | Freq: Every day | ORAL | Status: DC
  Administered 2023-10-30: 1 mg via ORAL
  Filled 2023-10-30 (×2): qty 1

## 2023-10-30 MED ORDER — TAMSULOSIN HCL 0.4 MG PO CAPS
0.4000 mg | ORAL_CAPSULE | Freq: Every day | ORAL | Status: DC
  Administered 2023-10-30 – 2023-10-31 (×2): 0.4 mg via ORAL
  Filled 2023-10-30 (×2): qty 1

## 2023-10-30 MED ORDER — SUCRALFATE 1 GM/10ML PO SUSP
1.0000 g | Freq: Three times a day (TID) | ORAL | Status: DC
  Administered 2023-10-30 – 2023-10-31 (×5): 1 g via ORAL
  Filled 2023-10-30 (×7): qty 10

## 2023-10-30 MED ORDER — SULFAMETHOXAZOLE-TRIMETHOPRIM 800-160 MG PO TABS: 1.0000 | ORAL_TABLET | Freq: Every day | ORAL | 0 refills | Status: DC

## 2023-10-30 MED ORDER — MONTELUKAST SODIUM 10 MG PO TABS
10.0000 mg | ORAL_TABLET | Freq: Every day | ORAL | Status: DC
  Administered 2023-10-30: 10 mg via ORAL
  Filled 2023-10-30: qty 1

## 2023-10-30 MED ORDER — BUSPIRONE HCL 5 MG PO TABS
7.5000 mg | ORAL_TABLET | Freq: Three times a day (TID) | ORAL | Status: DC
  Administered 2023-10-30 – 2023-10-31 (×3): 7.5 mg via ORAL
  Filled 2023-10-30 (×3): qty 1

## 2023-10-30 MED ORDER — INSULIN GLARGINE-YFGN 100 UNIT/ML ~~LOC~~ SOLN
30.0000 [IU] | Freq: Every day | SUBCUTANEOUS | Status: DC
  Administered 2023-10-30 – 2023-10-31 (×2): 30 [IU] via SUBCUTANEOUS
  Filled 2023-10-30 (×4): qty 0.3

## 2023-10-30 NOTE — Progress Notes (Signed)
Gave patient small sips of water , he was noted to cough, speech therapy consult placed by Dr. Laural Benes

## 2023-10-30 NOTE — Progress Notes (Signed)
   10/30/23 1500  Assess: MEWS Score  ECG Heart Rate (!) 101  Resp (!) 26  Assess: MEWS Score  MEWS Temp 0  MEWS Systolic 0  MEWS Pulse 1  MEWS RR 2  MEWS LOC 0  MEWS Score 3  MEWS Score Color Yellow  Assess: SIRS CRITERIA  SIRS Temperature  0  SIRS Pulse 1  SIRS Respirations  1  SIRS WBC 0  SIRS Score Sum  2

## 2023-10-30 NOTE — Progress Notes (Signed)
Remains yellow MEWS due to pulse and RR    10/30/23 0748  Assess: MEWS Score  SpO2 96 %

## 2023-10-30 NOTE — Care Management Important Message (Signed)
Important Message  Patient Details  Name: Matthew Molina MRN: [id number] Date of Birth: 09-05-1951   Important Message Given:  Yes - Medicare IM     Corey Harold 10/30/2023, 2:33 PM

## 2023-10-30 NOTE — Progress Notes (Signed)
PHARMACY - ANTICOAGULATION Pharmacy Consult for heparin Indication:  VTE treatment   Allergies  Allergen Reactions   Asa [Aspirin] Other (See Comments)    Stomach pain   Crestor [Rosuvastatin] Other (See Comments)    Stomach pain   Morphine Nausea And Vomiting   Wellbutrin [Bupropion] Other (See Comments)    Stomach pain   Zoloft [Sertraline Hcl] Other (See Comments)    Stomach pain    Patient Measurements: Height: 6' (182.9 cm) Weight: 83.2 kg (183 lb 6.8 oz) IBW/kg (Calculated) : 77.6 Heparin Dosing Weight: 88 kg  Vital Signs: Temp: 97.9 F (36.6 C) (11/12 0337) Temp Source: Oral (11/12 0053) BP: 119/89 (11/12 0337) Pulse Rate: 108 (11/12 0337)  Labs: Recent Labs    10/28/23 0338 10/28/23 0525 10/28/23 1542 10/28/23 1646 10/29/23 0402 10/29/23 1311 10/29/23 2212 10/30/23 0440  HGB 10.7*  --   --   --  11.2*  --   --  11.4*  HCT 35.2*  --   --   --  36.5*  --   --  36.3*  PLT 274  --   --   --  227  --   --  344  HEPARINUNFRC 0.20*   < >  --    < > 0.39 0.23* 0.60 0.52  CREATININE 0.98  --  1.01  --  1.00  --   --  1.18   < > = values in this interval not displayed.    Estimated Creatinine Clearance: 61.2 mL/min (by C-G formula based on SCr of 1.18 mg/dL).  Assessment: 72 y.o. male with possible PE for heparin.  Ultrasound negative for portal vein thrombosis. CBC WNL  HL 0.52 therapeutic on 2050 units/hr. No signs/symptoms of bleeding per RN.  Goal of Therapy:  Heparin level 0.3-0.7 units/ml Monitor platelets by anticoagulation protocol: Yes   Plan:  Continue heparin to 2050 units/hr.  Confirmatory heparin level daily Continue to monitor CBC   Judeth Cornfield, PharmD Clinical Pharmacist 10/30/2023 8:10 AM

## 2023-10-30 NOTE — Progress Notes (Signed)
   10/30/23 0748  Assess: MEWS Score  SpO2 96 %

## 2023-10-30 NOTE — Plan of Care (Signed)
  Problem: Education: Goal: Knowledge of General Education information will improve Description Including pain rating scale, medication(s)/side effects and non-pharmacologic comfort measures Outcome: Progressing   Problem: Health Behavior/Discharge Planning: Goal: Ability to manage health-related needs will improve Outcome: Progressing   

## 2023-10-30 NOTE — Progress Notes (Signed)
Nutrition Follow-up  DOCUMENTATION CODES:   Severe malnutrition in context of chronic illness  INTERVENTION:   TPN orders per Pharmacy. Unable to meet 100% of nutrition needs d/t national IVF shortage and use of premixed Clinimix.   NUTRITION DIAGNOSIS:   Severe Malnutrition related to chronic illness (GOO) as evidenced by severe muscle depletion, severe fat depletion, percent weight loss (8% weight loss x 3 months).  GOAL:   Patient will meet greater than or equal to 90% of their needs  MONITOR:   I & O's, Diet advancement  REASON FOR ASSESSMENT:   Consult New TPN/TNA  ASSESSMENT:   72 yo male admitted with portal vein thrombus, functional GOO with intractable vomiting. PMH includes COPD, DM-2, HTN, HLD, depression, tobacco abuse, CAD, diverticulitis, ischemic colitis, schizophrenia.  S/P gastrojejunostomy 11/8. MD ordered upper GI 11/11 to evaluate for leak.  NG tube was pulled by patient last night; replaced by RN; 2400 ml output x 24 hours.  Receiving Clinimix E 8/10 via PICC at 84 ml/h x 24 hours and 20% Smoflipid at 20 ml/h x 12 hours to provide 1820 kcal, 160 gm protein daily. This meets 79% of minimum estimated kcal needs and > 100% of estimated protein needs.   Admit weight 88 kg 11/5 Current weight 83.2 kg.  Labs reviewed. Na 132, BUN 50, trig 179 K and Phos were low after initiation of TPN, likely d/t refeeding syndrome. Both have been replaced and are WNL now.  CBG: [phone]  Medications reviewed and include colace, novolog, semglee, protonix, miralax, carafate.   Diet Order:   Diet Order             Diet NPO time specified  Diet effective now                   EDUCATION NEEDS:   Not appropriate for education at this time  Skin:  Skin Assessment: Reviewed RN Assessment  Last BM:  11/10 type 1  Height:   Ht Readings from Last 1 Encounters:  10/29/23 6' (1.829 m)    Weight:   Wt Readings from Last 1 Encounters:  10/30/23 83.2 kg     Ideal Body Weight:  80.9 kg  BMI:  Body mass index is 24.88 kg/m.  Estimated Nutritional Needs:   Kcal:  2300-2600  Protein:  120-135 gm  Fluid:  2.3-2.6 L   Gabriel Rainwater RD, LDN, CNSC Please refer to Amion for contact information.

## 2023-10-30 NOTE — Plan of Care (Signed)
Pulled ngt out during the night. Ngt replaced. Mitts applied.  Problem: Health Behavior/Discharge Planning: Goal: Ability to manage health-related needs will improve Outcome: Progressing   Problem: Clinical Measurements: Goal: Ability to maintain clinical measurements within normal limits will improve Outcome: Progressing Goal: Will remain free from infection Outcome: Progressing Goal: Diagnostic test results will improve Outcome: Progressing Goal: Respiratory complications will improve Outcome: Progressing Goal: Cardiovascular complication will be avoided Outcome: Progressing   Problem: Activity: Goal: Risk for activity intolerance will decrease Outcome: Progressing   Problem: Coping: Goal: Level of anxiety will decrease Outcome: Progressing   Problem: Elimination: Goal: Will not experience complications related to bowel motility Outcome: Progressing Goal: Will not experience complications related to urinary retention Outcome: Progressing   Problem: Pain Management: Goal: General experience of comfort will improve Outcome: Progressing   Problem: Safety: Goal: Ability to remain free from injury will improve Outcome: Progressing   Problem: Skin Integrity: Goal: Risk for impaired skin integrity will decrease Outcome: Progressing   Problem: Education: Goal: Knowledge of General Education information will improve Description: Including pain rating scale, medication(s)/side effects and non-pharmacologic comfort measures Outcome: Not Progressing   Problem: Nutrition: Goal: Adequate nutrition will be maintained Outcome: Not Progressing

## 2023-10-30 NOTE — Progress Notes (Signed)
Patient pulled out NGT. Reached out to night time provider and Dr.A.Zierre- Camillo Flaming wants NGT replaced. #18 FR [place] S[place] tube replaced via right nare with one attempt without any difficulty. Abd xray ordered to verify placement of NGT.

## 2023-10-30 NOTE — Progress Notes (Signed)
[facility] Progress Note  4 Days Post-Op  Subjective: Patient seen and examined.  He is resting comfortably in bed.  He denies abdominal pain.  Patient seems confused when discussing care.  He denies nausea and vomiting.  Per chart, he has bowel function.  NG tube remains in place, with 1.2 L of output documented today.  Objective: Vital signs in last 24 hours: Temp:  [97.4 F (36.3 C)-98.5 F (36.9 C)] 98.5 F (36.9 C) (11/12 1041) Pulse Rate:  [89-111] 110 (11/12 1041) Resp:  [22-26] 24 (11/12 1041) BP: (110-138)/(81-99) 110/81 (11/12 1041) SpO2:  [90 %-96 %] 94 % (11/12 1041) Weight:  [83.2 kg] 83.2 kg (11/12 0337) Last BM Date : 10/28/23  Intake/Output from previous day: 11/11 0701 - 11/12 0700 In: 982.3 [I.V.:982.3] Out: 3000 [Urine:600; Emesis/NG output:2400] Intake/Output this shift: Total I/O In: 252 [I.V.:252] Out: 1200 [Emesis/NG output:1200]  General appearance: alert, cooperative, and no distress Nose: NG tube in place with bilious output in canister GI: Abdomen soft, nondistended, no percussion tenderness, mild incisional tenderness to palpation; no rigidity, guarding, rebound tenderness; incisions C/D/I with skin staples in place  Lab Results:  Recent Labs    10/29/23 0402 10/30/23 0440  WBC 8.0 10.0  HGB 11.2* 11.4*  HCT 36.5* 36.3*  PLT 227 344   BMET Recent Labs    10/29/23 0402 10/30/23 0440  NA 132* 132*  K 3.9 3.6  CL 104 102  CO2 18* 18*  GLUCOSE 235* 316*  BUN 37* 50*  CREATININE 1.00 1.18  CALCIUM 10.1 11.0*   PT/INR No results for input(s): "LABPROT", "INR" in the last 72 hours.  Studies/Results: DG Abd Portable 1V  Result Date: 10/30/2023 CLINICAL DATA:  Nasogastric tube placement EXAM: PORTABLE ABDOMEN - 1 VIEW COMPARISON:  10/25/2023.  Upper GI from yesterday. FINDINGS: Enteric tube tip reaches the stomach where there is opacification from contrast. Limited coverage of the upper abdomen shows no dilated  bowel. Clear lung bases. IMPRESSION: Enteric tube with tip reaching the stomach where there is opacification by contrast. Electronically Signed   By: Tiburcio Pea M.D.   On: 10/30/2023 05:41   DG UGI W SINGLE CM (SOL OR THIN BA)  Result Date: 10/29/2023 CLINICAL DATA:  History of gastric outlet obstruction, post surgical creation of a gastrojejunostomy. Please perform targeted upper GI series to evaluate patency of the gastrojejunostomy as well as to exclude the presence of a leak. EXAM: DG UGI W SINGLE CM TECHNIQUE: Single contrast examination was then performed using Gastrografin. FLUOROSCOPY: 3 minutes (58.8 mGy) COMPARISON:  CT abdomen and pelvis-10/24/2023 FINDINGS: Patient was placed supine on the fluoroscopy table Preprocedural spot fluoroscopic and radiographic images were obtained of the upper abdomen demonstrating enteric tube tip and side port project over the expected location of the gastric antrum/duodenal bulb. Note is made of a midline surgical clips. Contrast was injected via the nasogastric tube with opacification of the gastric antrum and duodenal bulb, ultimately with faint opacification of the descending portion of the duodenum. The nasogastric tube was retracted to the level of the mid body of the stomach, regional to the expected location of the surgically created gastrojejunostomy. Despite the administration of additional contrast, obtaining delayed imaging and positioning of the fluoroscopy table at an approximately 45 degree angle, there is no definitive passage of contrast from the markedly dilated stomach through the gastrojejunostomy. Nasogastric tube was secured at the nasal brim after re-advancing the tube with tip and side port ultimately terminating within mid body  of the stomach. IMPRESSION: No definitive opacification of surgically created gastrojejunostomy, potentially technique related due to persistent marked gastric distention. Above findings discussed with referring  surgeon, Dr. Robyne Peers, at the time of procedure completion. Electronically Signed   By: Simonne Come M.D.   On: 10/29/2023 16:00    Anti-infectives: Anti-infectives (From admission, onward)    Start     Dose/Rate Route Frequency Ordered Stop   10/26/23 1400  ceFAZolin (ANCEF) IVPB 2g/100 mL premix        2 g 200 mL/hr over 30 Minutes Intravenous On call to O.R. 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  He is status post open gastrojejunostomy on 11/8.   -Patient with no leukocytosis.  Hemoglobin remained stable -Upper GI performed yesterday, and contrast was stable in the stomach, and was not moving down the gastrojejunostomy.  KUB today demonstrates contrast within the small bowel and colon -Will remove NG tube and trial clear liquids -TPN per hospitalist.  Would continue TPN until patient has adequate oral intake -Okay to continue heparin drip -Appreciate hospitalist recommendations   LOS: 7 days    Neera Teng A Jorian [place] 10/30/2023

## 2023-10-30 NOTE — Progress Notes (Addendum)
PROGRESS NOTE   Matthew Molina  MVH:[id number] DOB: [birth year] DOA: 10/23/2023 PCP: Kerri Perches, MD   No chief complaint on file.  Level of care: Telemetry  Brief Admission History:  72 year old male with a history of IgG MGUS, COPD, diabetes mellitus type 2, hypertension, hyperlipidemia, depression, tobacco abuse, coronary disease presenting with generalized weakness, abdominal pain, nausea and vomiting. The patient was recently admitted to the hospital from 09/30/2023 to 10/03/2023.  At that time, the patient had nausea and vomiting and poor oral intake and upper abdominal pain as well.  The patient was treated for sepsis secondary to aspiration pneumonia.  GI was consulted during the admission.  The patient underwent EGD on 10/02/2023 which showed grade B esophagitis without bleeding, nonbleeding gastric ulcers with a clean base, and duodenitis.  The patient was discharged home with PPI twice daily and sulcrafate.  The patient states that he was feeling well after d/c and he was tolerating a soft diet. However, he but began having gradual abdominal pain and belching in the past 1 week.  He states that things have gradually worsened over the past 3 days prior to admission with increasing belching and hiccups that has resulted in vomiting every time he belches.  He has had poor oral intake and progressive generalized weakness.  He also complains of worsening mid abdominal pain.  He denies f/c, cp, sob, hemoptysis, diarrhea, hematochezia, melena, dysuria, hematuria.  As result of abd pain with n/v, the patient presented for further evaluation and treatment.  He continues to smoke a few cigarette's a day. Notably, patient states he ate spaghetti and chocolate chip cookies before he came to ED and did not have any emesis.   In the ED, the patient was afebrile hemodynamically stable with oxygen saturation 92% on room air.  WBC 6.0, hemoglobin 11.1, platelets 332.  Sodium 132, potassium 3.1,  bicarbonate 28, serum creatinine 1.53.  LFTs were unremarkable.  Lipase 84.  CT of the abdomen and pelvis showed marked distention of the stomach to the level of the duodenum where there is circumferential thickening and heterogenous hypoattenuation at the site of the patient's previous duodenitis. There was a new hypodensity upstream portal vein suspicious for nonocclusive thrombus.  There is also a partially imaged hypodensity in the right upper lobe pulmonary artery which may represent artifact versus PE. The patient was started on IV heparin. -GI was consulted to assist with management.   Assessment and Plan:  Severe distal duodenal obstruction  - GI recommended CTA abdomen completed to rule out ischemic process, consult general surgery for gastrojejunostomy for drainage  - NG tube in place 60 cc out reported last 24 hours - continue pantoprazole  - Postop s/p gastrojejunostomy on 10/26/23  - pharm D consulted to start TPN on 11/9 - PICC line placed  - further recommendations to follow  - upper GI ordered by surgery to check for leaks 11/11   Question of portal venous thrombosis Question of PE - CTA chest/abd/pelvis unfortunately not able to evaluate for PE given timing of contrast bolus given  - liver doppler without evidence of portal vein thrombosis  - IV heparin infusion resumed for full dose anticoagulation - D dimer markedly elevated at 3.65 so we presume he does have a PE - check venous dopplers of legs to assess for DVT -- negative for DVT - check TTE to evaluate for right heart strain : Left ventricular ejection fraction, by estimation, is >75%. The left ventricle has normal function. There  is moderate left ventricular hypertrophy. Left ventricular diastolic parameters were normal. This was a technically difficult study.    CKD stage IIIa -Baseline creatinine 1.2-1.5 -Monitor daily BMP   Hypokalemia - repleted  - IV replacement ordered - Mg replaced - recheck K at 2pm and  give additional IV K if needed - discussed with pharm D  COPD -Stable on room air -Restart BDs -Continue Singulair when able to tolerate p.o.   Tobacco abuse -Tobacco cessation discussed   Depression/anxiety -restart risperdal and buspar when able to tolerate po   Controlled diabetes mellitus type 2 TPN ASSOCIATED HYPERGLYCEMIA -increased semglee to 30 units daily and monitor to titrate -continue resistant SSI coverage  -10/07/2023 hemoglobin A1c 6.0 -added 30 units insulin to TPN bag  -DC INSULIN when TPN is discontinued  CBG (last 3)  Recent Labs    10/30/23 0340 10/30/23 0724 10/30/23 1111  GLUCAP 306* 294* 156*    BPH -Restart tamsulosin when able to tolerate p.o   DVT prophylaxis:  SCDs Code Status: FULL  Family Communication:  Disposition: transferred to telemetry 11/11   Consultants:  GI -- signed off Pharm D Surgery   Procedures:  EGD 10/24/23   Antimicrobials:    Subjective: NG came out last night and was replaced.  He still having abdominal discomfort. He is asking for "coffee" this morning.   Objective: Vitals:   10/30/23 0053 10/30/23 0337 10/30/23 0748 10/30/23 1041  BP: 124/89 119/89  110/81  Pulse: 89 (!) 108  (!) 110  Resp: (!) 22 (!) 24  (!) 24  Temp: 98 F (36.7 C) 97.9 F (36.6 C)  98.5 F (36.9 C)  TempSrc: Oral   Oral  SpO2: 95%  96% 94%  Weight:  83.2 kg    Height:        Intake/Output Summary (Last 24 hours) at 10/30/2023 1215 Last data filed at 10/30/2023 1100 Gross per 24 hour  Intake 1234.27 ml  Output 3300 ml  Net -2065.73 ml   Filed Weights   10/28/23 0500 10/29/23 0222 10/30/23 0337  Weight: 84.4 kg 87.2 kg 83.2 kg   Examination:  General exam: awake, alert, moderately stressed.  NG tube in place.   Respiratory system: BBS heard. No rales. Cardiovascular system: normal S1 & S2 heard. No JVD, murmurs, rubs, gallops or clicks. No pedal edema. Gastrointestinal system: Abdomen is distended, soft, tender to  light palpation.  No organomegaly or masses felt. Normal bowel sounds heard. Central nervous system: awake, alert, cooperative.  No focal neurological deficits. Extremities: Symmetric 5 x 5 power. Skin: No rashes, lesions or ulcers. Psychiatry: Judgement and insight UTD   Data Reviewed: I have personally reviewed following labs and imaging studies  CBC: Recent Labs  Lab 10/26/23 0619 10/26/23 1351 10/27/23 0513 10/28/23 0338 10/29/23 0402 10/30/23 0440  WBC 5.5  --  11.6* 8.9 8.0 10.0  HGB 10.0* 12.2* 10.8* 10.7* 11.2* 11.4*  HCT 32.8* 36.0* 34.5* 35.2* 36.5* 36.3*  MCV 89.1  --  90.1 89.3 89.5 87.9  PLT 275  --  284 274 227 344    Basic Metabolic Panel: Recent Labs  Lab 10/25/23 0441 10/26/23 0619 10/26/23 1351 10/27/23 0513 10/28/23 0338 10/28/23 1349 10/28/23 1542 10/29/23 0402 10/30/23 0440  NA 136 136   < > 133* 133*  --  132* 132* 132*  K 3.5 2.5*   < > 2.6* 2.7* 6.0* 3.5 3.9 3.6  CL 101 99   < > 100 101  --  100  104 102  CO2 19* 27  --  23 24  --  21* 18* 18*  GLUCOSE 109* 91   < > 199* 216*  --  270* 235* 316*  BUN 20 17   < > 19 29*  --  35* 37* 50*  CREATININE 1.26* 1.28*   < > 1.14 0.98  --  1.01 1.00 1.18  CALCIUM 9.6 9.8  --  9.2 9.5  --  10.1 10.1 11.0*  MG 1.9 1.7  --  2.1 2.1  --   --  2.1  --   PHOS  --  2.4*  --  2.7  --   --   --  2.2* 3.4   < > = values in this interval not displayed.    CBG: Recent Labs  Lab 10/29/23 1953 10/30/23 0002 10/30/23 0340 10/30/23 0724 10/30/23 1111  GLUCAP 254* 286* 306* 294* 156*    Recent Results (from the past 240 hour(s))  MRSA Next Gen by PCR, Nasal     Status: None   Collection Time: 10/24/23  4:00 PM   Specimen: Nasal Mucosa; Nasal Swab  Result Value Ref Range Status   MRSA by PCR Next Gen NOT DETECTED NOT DETECTED Final    Comment: (NOTE) The GeneXpert MRSA Assay (FDA approved for NASAL specimens only), is one component of a comprehensive MRSA colonization surveillance program. It is not  intended to diagnose MRSA infection nor to guide or monitor treatment for MRSA infections. Test performance is not FDA approved in patients less than 12 years old. Performed at [facility], [address] [place], [place] [address]      Radiology Studies: DG Abd Portable 1V  Result Date: 10/30/2023 CLINICAL DATA:  Nasogastric tube placement EXAM: PORTABLE ABDOMEN - 1 VIEW COMPARISON:  10/25/2023.  Upper GI from yesterday. FINDINGS: Enteric tube tip reaches the stomach where there is opacification from contrast. Limited coverage of the upper abdomen shows no dilated bowel. Clear lung bases. IMPRESSION: Enteric tube with tip reaching the stomach where there is opacification by contrast. Electronically Signed   By: Tiburcio Pea M.D.   On: 10/30/2023 05:41   DG UGI W SINGLE CM (SOL OR THIN BA)  Result Date: 10/29/2023 CLINICAL DATA:  History of gastric outlet obstruction, post surgical creation of a gastrojejunostomy. Please perform targeted upper GI series to evaluate patency of the gastrojejunostomy as well as to exclude the presence of a leak. EXAM: DG UGI W SINGLE CM TECHNIQUE: Single contrast examination was then performed using Gastrografin. FLUOROSCOPY: 3 minutes (58.8 mGy) COMPARISON:  CT abdomen and pelvis-10/24/2023 FINDINGS: Patient was placed supine on the fluoroscopy table Preprocedural spot fluoroscopic and radiographic images were obtained of the upper abdomen demonstrating enteric tube tip and side port project over the expected location of the gastric antrum/duodenal bulb. Note is made of a midline surgical clips. Contrast was injected via the nasogastric tube with opacification of the gastric antrum and duodenal bulb, ultimately with faint opacification of the descending portion of the duodenum. The nasogastric tube was retracted to the level of the mid body of the stomach, regional to the expected location of the surgically created gastrojejunostomy. Despite the administration of  additional contrast, obtaining delayed imaging and positioning of the fluoroscopy table at an approximately 45 degree angle, there is no definitive passage of contrast from the markedly dilated stomach through the gastrojejunostomy. Nasogastric tube was secured at the nasal brim after re-advancing the tube with tip and side port ultimately terminating within  mid body of the stomach. IMPRESSION: No definitive opacification of surgically created gastrojejunostomy, potentially technique related due to persistent marked gastric distention. Above findings discussed with referring surgeon, Dr. Robyne Peers, at the time of procedure completion. Electronically Signed   By: Simonne Come M.D.   On: 10/29/2023 16:00    Scheduled Meds:  acetaminophen (TYLENOL) oral liquid 160 mg/5 mL  650 mg Per Tube Q6H   arformoterol  15 mcg Nebulization BID   bisacodyl  10 mg Rectal Daily   budesonide (PULMICORT) nebulizer solution  0.5 mg Nebulization BID   busPIRone  7.5 mg Per Tube TID   Chlorhexidine Gluconate Cloth  6 each Topical Daily   docusate  100 mg Per Tube BID   doxazosin  1 mg Per Tube q1800   insulin aspart  0-20 Units Subcutaneous Q4H   insulin glargine-yfgn  30 Units Subcutaneous Daily   ipratropium-albuterol  3 mL Nebulization TID   metoprolol tartrate  5 mg Intravenous Q6H   montelukast  10 mg Per Tube QHS   pantoprazole (PROTONIX) IV  40 mg Intravenous Q12H   polyethylene glycol  17 g Per Tube Daily   risperiDONE  1 mg Per Tube QHS   sodium chloride flush  10-40 mL Intracatheter Q12H   sucralfate  1 g Per Tube TID WC & HS   Continuous Infusions:  .TPN (CLINIMIX-E) Adult 84 mL/hr at 10/30/23 0900   .TPN (CLINIMIX-E) Adult     And   fat emul(SMOFlipid)     heparin 2,050 Units/hr (10/29/23 2353)     LOS: 7 days   Time spent: 52 mins  Mattheus Rauls Laural Benes, MD How to contact the [facility] Attending or Consulting provider 7A - 7P or covering provider during after hours 7P -7A, for this patient?  Check the  care team in [facility] and look for a) attending/consulting [facility] provider listed and b) the [facility] team listed Log into [email or web address] to find provider on call.  Locate the [facility] provider you are looking for under [facility] and page to a number that you can be directly reached. If you still have difficulty reaching the provider, please page the [facility] (Director on Call) for the Hospitalists listed on amion for assistance.  10/30/2023, 12:15 PM

## 2023-10-30 NOTE — Progress Notes (Signed)
PHARMACY - TOTAL PARENTERAL NUTRITION CONSULT NOTE   Indication:  Gastric outlet obstruction  Patient Measurements: Height: 6' (182.9 cm) Weight: 83.2 kg (183 lb 6.8 oz) IBW/kg (Calculated) : 77.6 TPN AdjBW (KG): 87.2 Body mass index is 24.88 kg/m.   Assessment: Patient with gastric outlet obstruction that  is preventing him from passing any food or drink and resulting with his abdominal distention which is causing pain, nausea, and vomiting. Since this area of his duodenum has been inflamed for such a significant period of time and has caused secondary complications.  Gastrojejunostomy to bypass gastric outlet obstruction surgery completed 11/8. Plan to perform upper GI Monday (11/11) to evaluate for any leak depending on NGTube output which has been high.  Glucose / Insulin: BS 243-316. Novolog 67 units given, semglee dose adjusted- adding insulin to TPN Electrolytes: Na 132 K 3.6 Phos 2.2 > 3.4  Renal: stable Hepatic: TG 179 GI Imaging/procedures:EGD 11/6 showed distal duodenal obstruction.    Central access: 11/8 PICC line TPN start date: 11/8  Nutritional Goals: Goal TPN rate is 70 mL/hr + 250 mL lipids (50g)(provides 134 g of protein and 2040 kcals per day)  RD Assessment: Estimated Needs Total Energy Estimated Needs: 2300-2600 Total Protein Estimated Needs: 120-135 gm Total Fluid Estimated Needs: 2.3-2.6 L  Current Nutrition:  NPO  Plan:  TPN to 48mL/hr at 1800 + lipids 20% infused over 12 hours daily Electrolytes in TPN: Na 35 mEq/L, K 30 mEq/L, Ca 4.4mEq/L, Mg 77mEq/L, and Phos 49mmol/L. Cl:Ac 1:1 Add standard MVI and trace elements to TPN Add 30 units insulin to TPN Continue resistant q4h SSI and adjust as needed. Monitor TPN labs on Mon/Thurs   Judeth Cornfield, PharmD Clinical Pharmacist 10/30/2023 9:23 AM

## 2023-10-31 ENCOUNTER — Inpatient Hospital Stay (HOSPITAL_COMMUNITY): Payer: 59

## 2023-10-31 DIAGNOSIS — I81 Portal vein thrombosis: Secondary | ICD-10-CM | POA: Diagnosis not present

## 2023-10-31 DIAGNOSIS — J690 Pneumonitis due to inhalation of food and vomit: Secondary | ICD-10-CM | POA: Insufficient documentation

## 2023-10-31 DIAGNOSIS — K311 Adult hypertrophic pyloric stenosis: Secondary | ICD-10-CM | POA: Diagnosis not present

## 2023-10-31 DIAGNOSIS — G9341 Metabolic encephalopathy: Secondary | ICD-10-CM

## 2023-10-31 DIAGNOSIS — Z720 Tobacco use: Secondary | ICD-10-CM | POA: Diagnosis not present

## 2023-10-31 DIAGNOSIS — N1831 Chronic kidney disease, stage 3a: Secondary | ICD-10-CM | POA: Diagnosis not present

## 2023-10-31 LAB — BASIC METABOLIC PANEL
Anion gap: 13 (ref 5–15)
BUN: 84 mg/dL — ABNORMAL HIGH (ref 8–23)
CO2: 13 mmol/L — ABNORMAL LOW (ref 22–32)
Calcium: 12.2 mg/dL — ABNORMAL HIGH (ref 8.9–10.3)
Chloride: 104 mmol/L (ref 98–111)
Creatinine, Ser: 1.43 mg/dL — ABNORMAL HIGH (ref 0.61–1.24)
GFR, Estimated: 52 mL/min — ABNORMAL LOW (ref >60)
Glucose, Bld: 288 mg/dL — ABNORMAL HIGH (ref 70–99)
Potassium: 4.4 mmol/L (ref 3.5–5.1)
Sodium: 130 mmol/L — ABNORMAL LOW (ref 135–145)

## 2023-10-31 LAB — URINALYSIS, W/ REFLEX TO CULTURE (INFECTION SUSPECTED)
Bilirubin Urine: NEGATIVE
Glucose, UA: 150 mg/dL — AB
Ketones, ur: NEGATIVE mg/dL
Leukocytes,Ua: NEGATIVE
Nitrite: NEGATIVE
Protein, ur: NEGATIVE mg/dL
Specific Gravity, Urine: 1.024 (ref 1.005–1.030)
pH: 5 (ref 5.0–8.0)

## 2023-10-31 LAB — LACTIC ACID, PLASMA: Lactic Acid, Venous: 1.7 mmol/L (ref 0.5–1.9)

## 2023-10-31 LAB — CBC
HCT: 37.1 % — ABNORMAL LOW (ref 39.0–52.0)
HCT: 38.2 % — ABNORMAL LOW (ref 39.0–52.0)
Hemoglobin: 11.5 g/dL — ABNORMAL LOW (ref 13.0–17.0)
Hemoglobin: 12.2 g/dL — ABNORMAL LOW (ref 13.0–17.0)
MCH: 27.1 pg (ref 26.0–34.0)
MCH: 28 pg (ref 26.0–34.0)
MCHC: 31 g/dL (ref 30.0–36.0)
MCHC: 31.9 g/dL (ref 30.0–36.0)
MCV: 87.5 fL (ref 80.0–100.0)
MCV: 87.8 fL (ref 80.0–100.0)
Platelets: 279 10*3/uL (ref 150–400)
Platelets: 203 10*3/uL (ref 150–400)
RBC: 4.24 MIL/uL (ref 4.22–5.81)
RBC: 4.35 MIL/uL (ref 4.22–5.81)
RDW: 16.9 % — ABNORMAL HIGH (ref 11.5–15.5)
RDW: 17.1 % — ABNORMAL HIGH (ref 11.5–15.5)
WBC: 13.2 10*3/uL — ABNORMAL HIGH (ref 4.0–10.5)
WBC: 15.8 10*3/uL — ABNORMAL HIGH (ref 4.0–10.5)
nRBC: 0.2 % (ref 0.0–0.2)
nRBC: 0.3 % — ABNORMAL HIGH (ref 0.0–0.2)

## 2023-10-31 LAB — COMPREHENSIVE METABOLIC PANEL
ALT: 26 U/L (ref 0–44)
AST: 32 U/L (ref 15–41)
Albumin: 3.2 g/dL — ABNORMAL LOW (ref 3.5–5.0)
Alkaline Phosphatase: 40 U/L (ref 38–126)
Anion gap: 7 (ref 5–15)
BUN: 72 mg/dL — ABNORMAL HIGH (ref 8–23)
CO2: 15 mmol/L — ABNORMAL LOW (ref 22–32)
Calcium: 11.1 mg/dL — ABNORMAL HIGH (ref 8.9–10.3)
Chloride: 108 mmol/L (ref 98–111)
Creatinine, Ser: 1.36 mg/dL — ABNORMAL HIGH (ref 0.61–1.24)
GFR, Estimated: 55 mL/min — ABNORMAL LOW (ref >60)
Glucose, Bld: 285 mg/dL — ABNORMAL HIGH (ref 70–99)
Potassium: 4 mmol/L (ref 3.5–5.1)
Sodium: 130 mmol/L — ABNORMAL LOW (ref 135–145)
Total Bilirubin: 0.5 mg/dL (ref <1.2)
Total Protein: 7.3 g/dL (ref 6.5–8.1)

## 2023-10-31 LAB — BLOOD GAS, ARTERIAL
Acid-base deficit: 8.6 mmol/L — ABNORMAL HIGH (ref 0.0–2.0)
Bicarbonate: 13.9 mmol/L — ABNORMAL LOW (ref 20.0–28.0)
Drawn by: 41977
O2 Saturation: 92 %
Patient temperature: 37.1
pCO2 arterial: 22 mm[Hg] — ABNORMAL LOW (ref 32–48)
pH, Arterial: 7.41 (ref 7.35–7.45)
pO2, Arterial: 62 mm[Hg] — ABNORMAL LOW (ref 83–108)

## 2023-10-31 LAB — MRSA NEXT GEN BY PCR, NASAL: MRSA by PCR Next Gen: NOT DETECTED

## 2023-10-31 LAB — GLUCOSE, CAPILLARY
Glucose-Capillary: 215 mg/dL — ABNORMAL HIGH (ref 70–99)
Glucose-Capillary: 216 mg/dL — ABNORMAL HIGH (ref 70–99)
Glucose-Capillary: 224 mg/dL — ABNORMAL HIGH (ref 70–99)
Glucose-Capillary: 237 mg/dL — ABNORMAL HIGH (ref 70–99)
Glucose-Capillary: 238 mg/dL — ABNORMAL HIGH (ref 70–99)
Glucose-Capillary: 255 mg/dL — ABNORMAL HIGH (ref 70–99)
Glucose-Capillary: 257 mg/dL — ABNORMAL HIGH (ref 70–99)
Glucose-Capillary: 289 mg/dL — ABNORMAL HIGH (ref 70–99)

## 2023-10-31 LAB — BRAIN NATRIURETIC PEPTIDE: B Natriuretic Peptide: 17 pg/mL (ref 0.0–100.0)

## 2023-10-31 LAB — T4, FREE: Free T4: 1.16 ng/dL — ABNORMAL HIGH (ref 0.61–1.12)

## 2023-10-31 LAB — MAGNESIUM: Magnesium: 2.3 mg/dL (ref 1.7–2.4)

## 2023-10-31 LAB — AMMONIA: Ammonia: 32 umol/L (ref 9–35)

## 2023-10-31 LAB — VITAMIN B12: Vitamin B-12: 1334 pg/mL — ABNORMAL HIGH (ref 180–914)

## 2023-10-31 LAB — HEPARIN LEVEL (UNFRACTIONATED): Heparin Unfractionated: 0.71 [IU]/mL — ABNORMAL HIGH (ref 0.30–0.70)

## 2023-10-31 LAB — TSH: TSH: 1.765 u[IU]/mL (ref 0.350–4.500)

## 2023-10-31 LAB — PROCALCITONIN: Procalcitonin: 0.24 ng/mL

## 2023-10-31 LAB — PHOSPHORUS: Phosphorus: 3.4 mg/dL (ref 2.5–4.6)

## 2023-10-31 LAB — FOLATE: Folate: >40 ng/mL (ref >5.9)

## 2023-10-31 MED ORDER — PIPERACILLIN-TAZOBACTAM 3.375 G IVPB
3.3750 g | Freq: Three times a day (TID) | INTRAVENOUS | Status: AC
  Administered 2023-10-31 – 2023-11-07 (×21): 3.375 g via INTRAVENOUS
  Filled 2023-10-31 (×22): qty 50

## 2023-10-31 MED ORDER — SODIUM CHLORIDE 0.9 % IV SOLN: INTRAVENOUS | Status: DC

## 2023-10-31 MED ORDER — INSULIN ASPART 100 UNIT/ML IJ SOLN
2.0000 [IU] | INTRAMUSCULAR | Status: DC
  Administered 2023-10-31 – 2023-11-01 (×3): 2 [IU] via SUBCUTANEOUS

## 2023-10-31 MED ORDER — TRACE MINERALS CU-MN-SE-ZN 300-55-60-3000 MCG/ML IV SOLN
INTRAVENOUS | Status: DC
  Filled 2023-10-31: qty 1680

## 2023-10-31 MED ORDER — IOHEXOL 300 MG/ML  SOLN
100.0000 mL | Freq: Once | INTRAMUSCULAR | Status: AC | PRN
  Administered 2023-10-31: 100 mL via INTRAVENOUS

## 2023-10-31 MED ORDER — DEXTROSE-SODIUM CHLORIDE 5-0.9 % IV SOLN: INTRAVENOUS | Status: DC

## 2023-10-31 MED ORDER — FAT EMUL FISH OIL/PLANT BASED 20% (SMOFLIPID)IV EMUL
250.0000 mL | INTRAVENOUS | Status: DC
  Administered 2023-10-31: 250 mL via INTRAVENOUS
  Filled 2023-10-31: qty 250

## 2023-10-31 MED ORDER — SODIUM BICARBONATE 8.4 % IV SOLN
INTRAVENOUS | Status: AC
  Administered 2023-10-31: 50 meq via INTRAVENOUS
  Filled 2023-10-31: qty 50

## 2023-10-31 MED ORDER — SODIUM CHLORIDE 0.9 % IV BOLUS
1000.0000 mL | Freq: Once | INTRAVENOUS | Status: AC
  Administered 2023-10-31: 1000 mL via INTRAVENOUS

## 2023-10-31 MED ORDER — SODIUM BICARBONATE 8.4 % IV SOLN: 50.0000 meq | Freq: Once | INTRAVENOUS | Status: AC

## 2023-10-31 NOTE — Progress Notes (Signed)
Rapid Response Event Note   Reason for Call :   Called at shift change for decreased LOC and concerns for periods of apnea.   Initial Focused Assessment:   Upon pt lying in bed with mouth opened and eyes closed with minimal response to painful stimuli. Per day shift staff patient LOC had been declining throughout the day and Tat MD was made aware.   Interventions:   VS taken and patient found to be 98.2 , 112 HR, 96% 2L [place], 113/82 BP. EKG obtained and shown to MD at bedside.   Plan of Care:   Move patient to high level of care.  Event Summary:   Pt moved to stepdown.  MD Notified: 1919 Call Time:1919 Arrival Time:1920 RRT, Kohen.Colander MD End Time:1950  Marvis Repress, RN

## 2023-10-31 NOTE — Progress Notes (Addendum)
PROGRESS NOTE  Matthew Molina YTK:[id number] DOB: Nov 30, 1951 DOA: 10/23/2023 PCP: Kerri Perches, MD  Brief History:  72 year old male with a history of IgG MGUS, COPD, diabetes mellitus type 2, hypertension, hyperlipidemia, depression, tobacco abuse, coronary disease presenting with generalized weakness, abdominal pain, nausea and vomiting. The patient was recently admitted to the hospital from 09/30/2023 to 10/03/2023.  At that time, the patient had nausea and vomiting and poor oral intake and upper abdominal pain as well.  The patient was treated for sepsis secondary to aspiration pneumonia.  GI was consulted during the admission.  The patient underwent EGD on 10/02/2023 which showed grade B esophagitis without bleeding, nonbleeding gastric ulcers with a clean base, and duodenitis.  The patient was discharged home with PPI twice daily and sulcrafate.  The patient states that he was feeling well after d/c and he was tolerating a soft diet. However, he but began having gradual abdominal pain and belching in the past 1 week.  He states that things have gradually worsened over the past 3 days prior to admission with increasing belching and hiccups that has resulted in vomiting every time he belches.  He has had poor oral intake and progressive generalized weakness.  He also complains of worsening mid abdominal pain.  He denies f/c, cp, sob, hemoptysis, diarrhea, hematochezia, melena, dysuria, hematuria.  As result of abd pain with n/v, the patient presented for further evaluation and treatment.  He continues to smoke a few cigarette's a day. Notably, patient states he ate spaghetti and chocolate chip cookies before he came to ED and did not have any emesis.   In the ED, the patient was afebrile hemodynamically stable with oxygen saturation 92% on room air.  WBC 6.0, hemoglobin 11.1, platelets 332.  Sodium 132, potassium 3.1, bicarbonate 28, serum creatinine 1.53.  LFTs were unremarkable.   Lipase 84.  CT of the abdomen and pelvis showed marked distention of the stomach to the level of the duodenum where there is circumferential thickening and heterogenous hypoattenuation at the site of the patient's previous duodenitis. There was a new hypodensity upstream portal vein suspicious for nonocclusive thrombus.  There is also a partially imaged hypodensity in the right upper lobe pulmonary artery which may represent artifact versus PE. The patient was started on IV heparin. -GI was consulted to assist with management.   Assessment/Plan: Severe distal duodenal obstruction  - GI recommended CTA abdomen completed to rule out ischemic process, consult general surgery for gastrojejunostomy for drainage  - celiac, SMA, IMA patent - continue pantoprazole IV bid - Postop s/p gastrojejunostomy on 10/26/23  - pharm D consulted to start TPN on 11/9 - PICC line placed  - upper GI ordered by surgery  11/11 >>>11/13 contrast in SB loops and Right colon>>diet advanced  Respiratory Distress/Acute respiratory failure -increase RR evening 10/31/23 -personally reviewed CXR--no edema -suspect aspiration -start empiric zosyn -ABG--7.41/22/62/14 on RA>>>place on 2L -already on IV heparin  Acute metabolic Encephalopathy -less interactive 10/30/13 -ABG--7.41/22/62/24 on RA -B12 -Tsh -ammonia -UA/culture -personally reviewed CXR--no edema, no consolidation  Metabolic acidosis -10/31/23 ABG as above -check lactate -BMP -contributing to his tachypnea  Question of portal venous thrombosis Question of PE - CTA chest/abd/pelvis unfortunately not able to evaluate for PE given timing of contrast bolus given  - liver doppler without evidence of portal vein thrombosis  - IV heparin infusion resumed for full dose anticoagulation - D dimer markedly elevated at 3.65 so we  presume he does have a PE - check venous dopplers of legs to assess for DVT -- negative for DVT - check TTE to evaluate for right  heart strain : Left ventricular ejection fraction, by estimation, is >75%. The left ventricle has normal function. There is moderate left ventricular hypertrophy. Left ventricular diastolic parameters were normal. This was a technically difficult study.  RV grossly normal   CKD stage IIIa -Baseline creatinine 1.2-1.5 -Monitor daily BMP   Hypokalemia - repleted  - IV replacement ordered - Mg replaced   COPD -Stable on room air -Restart BDs -Continue Singulair when able to tolerate p.o.   Tobacco abuse -Tobacco cessation discussed   Depression/anxiety -restart risperdal and buspar when able to tolerate po   Controlled diabetes mellitus type 2 TPN ASSOCIATED HYPERGLYCEMIA -increased semglee to 30 units daily and monitor to titrate -continue resistant SSI coverage  -10/07/2023 hemoglobin A1c 6.0 -added 30 units insulin to TPN bag  -DC INSULIN when TPN is discontinued    BPH -Restart tamsulosin when able to tolerate p.o       Family Communication:   daughter updated 11/13  Consultants:  general surgery  Code Status:  FULL   DVT Prophylaxis:  IV Heparin   Procedures: As Listed in Progress Note Above  Antibiotics: None    The patient is critically ill with multiple organ systems failure and requires high complexity decision making for assessment and support, frequent evaluation and titration of therapies, application of advanced monitoring technologies and extensive interpretation of multiple databases.  Critical care time - .     Subjective: Pt slow to respond.  Complains sob.  Denies cp, abd pain.  No emesis.  Objective: Vitals:   10/31/23 1130 10/31/23 1221 10/31/23 1646 10/31/23 1724  BP: 104/81 120/82 119/85 96/84  Pulse: (!) 108 (!) 109 (!) 114 (!) 111  Resp: (!) 22  (!) 30   Temp: 97.6 F (36.4 C)  97.6 F (36.4 C) 97.8 F (36.6 C)  TempSrc: Oral  Axillary   SpO2: 94%  98% 96%  Weight:      Height:        Intake/Output Summary (Last  24 hours) at 10/31/2023 1733 Last data filed at 10/31/2023 0900 Gross per 24 hour  Intake 100 ml  Output 900 ml  Net -800 ml   Weight change: 2.5 kg Exam:  General:  Pt is alert, follows commands appropriately, not in acute distress HEENT: No icterus, No thrush, No neck mass, [place]/AT Cardiovascular: RRR, S1/S2, no rubs, no gallops Respiratory: bibasilar rales.  Diminished BS on right Abdomen: Soft/+BS, non tender, non distended, no guarding Extremities: No edema, No lymphangitis, No petechiae, No rashes, no synovitis Neuro:  CN II-XII intact, strength 4--/5 in RUE, RLE, strength 4-/5 LUE, LLE; sensation intact bilateral; no dysmetria; babinski equivocal    Data Reviewed: I have personally reviewed following labs and imaging studies Basic Metabolic Panel: Recent Labs  Lab 10/26/23 0619 10/26/23 1351 10/27/23 0513 10/28/23 0338 10/28/23 1349 10/28/23 1542 10/29/23 0402 10/30/23 0440 10/31/23 0435  NA 136   < > 133* 133*  --  132* 132* 132* 130*  K 2.5*   < > 2.6* 2.7* 6.0* 3.5 3.9 3.6 4.0  CL 99   < > 100 101  --  100 104 102 108  CO2 27  --  23 24  --  21* 18* 18* 15*  GLUCOSE 91   < > 199* 216*  --  270* 235* 316* 285*  BUN  17   < > 19 29*  --  35* 37* 50* 72*  CREATININE 1.28*   < > 1.14 0.98  --  1.01 1.00 1.18 1.36*  CALCIUM 9.8  --  9.2 9.5  --  10.1 10.1 11.0* 11.1*  MG 1.7  --  2.1 2.1  --   --  2.1  --  2.3  PHOS 2.4*  --  2.7  --   --   --  2.2* 3.4 3.4   < > = values in this interval not displayed.   Liver Function Tests: Recent Labs  Lab 10/26/23 0619 10/27/23 0513 10/28/23 0338 10/29/23 0402 10/31/23 0435  AST 13* 13* 11* 16 32  ALT 15 13 12 12 26   ALKPHOS 32* 32* 31* 34* 40  BILITOT 0.9 0.8 0.6 0.5 0.5  PROT 6.4* 6.4* 6.3* 6.8 7.3  ALBUMIN 3.4* 3.2* 3.0* 3.0* 3.2*   No results for input(s): "LIPASE", "AMYLASE" in the last 168 hours. No results for input(s): "AMMONIA" in the last 168 hours. Coagulation Profile: No results for input(s): "INR",  "PROTIME" in the last 168 hours. CBC: Recent Labs  Lab 10/27/23 0513 10/28/23 0338 10/29/23 0402 10/30/23 0440 10/31/23 0435  WBC 11.6* 8.9 8.0 10.0 13.2*  HGB 10.8* 10.7* 11.2* 11.4* 11.5*  HCT 34.5* 35.2* 36.5* 36.3* 37.1*  MCV 90.1 89.3 89.5 87.9 87.5  PLT 284 274 227 344 279   Cardiac Enzymes: No results for input(s): "CKTOTAL", "CKMB", "CKMBINDEX", "TROPONINI" in the last 168 hours. BNP: Invalid input(s): "POCBNP" CBG: Recent Labs  Lab 10/30/23 2349 10/31/23 0342 10/31/23 0806 10/31/23 1059 10/31/23 1607  GLUCAP 318* 289* 255* 237* 257*   HbA1C: No results for input(s): "HGBA1C" in the last 72 hours. Urine analysis:    Component Value Date/Time   COLORURINE YELLOW 10/23/2023 0946   APPEARANCEUR CLEAR 10/23/2023 0946   APPEARANCEUR Clear 06/26/2022 1102   LABSPEC 1.024 10/23/2023 0946   PHURINE 7.0 10/23/2023 0946   GLUCOSEU >=500 (A) 10/23/2023 0946   HGBUR NEGATIVE 10/23/2023 0946   HGBUR negative 02/25/2009 1257   BILIRUBINUR NEGATIVE 10/23/2023 0946   BILIRUBINUR Negative 06/26/2022 1102   KETONESUR 5 (A) 10/23/2023 0946   PROTEINUR NEGATIVE 10/23/2023 0946   UROBILINOGEN 0.2 02/25/2009 1257   NITRITE NEGATIVE 10/23/2023 0946   LEUKOCYTESUR NEGATIVE 10/23/2023 0946   Sepsis Labs: @LABRCNTIP (procalcitonin:4,lacticidven:4) ) Recent Results (from the past 240 hour(s))  MRSA Next Gen by PCR, Nasal     Status: None   Collection Time: 10/24/23  4:00 PM   Specimen: Nasal Mucosa; Nasal Swab  Result Value Ref Range Status   MRSA by PCR Next Gen NOT DETECTED NOT DETECTED Final    Comment: (NOTE) The GeneXpert MRSA Assay (FDA approved for NASAL specimens only), is one component of a comprehensive MRSA colonization surveillance program. It is not intended to diagnose MRSA infection nor to guide or monitor treatment for MRSA infections. Test performance is not FDA approved in patients less than 41 years old. Performed at [facility], [address],  [place], [place] [address]      Scheduled Meds:  acetaminophen (TYLENOL) oral liquid 160 mg/5 mL  650 mg Oral Q6H   arformoterol  15 mcg Nebulization BID   bisacodyl  10 mg Oral Daily   budesonide (PULMICORT) nebulizer solution  0.5 mg Nebulization BID   busPIRone  7.5 mg Oral TID   Chlorhexidine Gluconate Cloth  6 each Topical Daily   docusate sodium  100 mg Oral BID  insulin aspart  0-20 Units Subcutaneous Q4H   insulin aspart  2 Units Subcutaneous Q4H   insulin glargine-yfgn  30 Units Subcutaneous Daily   ipratropium-albuterol  3 mL Nebulization TID   metoprolol tartrate  5 mg Intravenous Q6H   montelukast  10 mg Oral QHS   pantoprazole (PROTONIX) IV  40 mg Intravenous Q12H   polyethylene glycol  17 g Oral Daily   risperiDONE  1 mg Oral QHS   sodium chloride flush  10-40 mL Intracatheter Q12H   sucralfate  1 g Oral TID WC & HS   tamsulosin  0.4 mg Oral QPC supper   Continuous Infusions:  .TPN (CLINIMIX-E) Adult 70 mL/hr at 10/30/23 1745   TPN (CLINIMIX) Adult with Electrolyte Additives     And   fat emul(SMOFlipid)     heparin 1,950 Units/hr (10/31/23 1500)    Procedures/Studies: DG Abd 1 View  Result Date: 10/30/2023 CLINICAL DATA:  Status post bypass gastrojejunostomy. EXAM: ABDOMEN - 1 VIEW COMPARISON:  Upper GI examination on 10/28/2020 and abdominal radiograph on 10/30/2023 at 1248 hours FINDINGS: Upper abdomen is only partially imaged. There is retained contrast in the stomach. Nasogastric tube is partially imaged. There is contrast within the pelvis and right side of the abdomen. There may be a small amount of contrast in the right colon. IMPRESSION: 1. Residual contrast in the stomach. Stomach is only partially imaged on this examination. 2. Contrast in small bowel loops and probably right colon. Electronically Signed   By: Richarda Overlie M.D.   On: 10/30/2023 16:59   DG Abd Portable 1V  Result Date: 10/30/2023 CLINICAL DATA:  Nasogastric tube placement EXAM: PORTABLE  ABDOMEN - 1 VIEW COMPARISON:  10/25/2023.  Upper GI from yesterday. FINDINGS: Enteric tube tip reaches the stomach where there is opacification from contrast. Limited coverage of the upper abdomen shows no dilated bowel. Clear lung bases. IMPRESSION: Enteric tube with tip reaching the stomach where there is opacification by contrast. Electronically Signed   By: Tiburcio Pea M.D.   On: 10/30/2023 05:41   DG UGI W SINGLE CM (SOL OR THIN BA)  Result Date: 10/29/2023 CLINICAL DATA:  History of gastric outlet obstruction, post surgical creation of a gastrojejunostomy. Please perform targeted upper GI series to evaluate patency of the gastrojejunostomy as well as to exclude the presence of a leak. EXAM: DG UGI W SINGLE CM TECHNIQUE: Single contrast examination was then performed using Gastrografin. FLUOROSCOPY: 3 minutes (58.8 mGy) COMPARISON:  CT abdomen and pelvis-10/24/2023 FINDINGS: Patient was placed supine on the fluoroscopy table Preprocedural spot fluoroscopic and radiographic images were obtained of the upper abdomen demonstrating enteric tube tip and side port project over the expected location of the gastric antrum/duodenal bulb. Note is made of a midline surgical clips. Contrast was injected via the nasogastric tube with opacification of the gastric antrum and duodenal bulb, ultimately with faint opacification of the descending portion of the duodenum. The nasogastric tube was retracted to the level of the mid body of the stomach, regional to the expected location of the surgically created gastrojejunostomy. Despite the administration of additional contrast, obtaining delayed imaging and positioning of the fluoroscopy table at an approximately 45 degree angle, there is no definitive passage of contrast from the markedly dilated stomach through the gastrojejunostomy. Nasogastric tube was secured at the nasal brim after re-advancing the tube with tip and side port ultimately terminating within mid body  of the stomach. IMPRESSION: No definitive opacification of surgically created gastrojejunostomy, potentially technique related  due to persistent marked gastric distention. Above findings discussed with referring surgeon, Dr. Robyne Peers, at the time of procedure completion. Electronically Signed   By: Simonne Come M.D.   On: 10/29/2023 16:00   ECHOCARDIOGRAM COMPLETE  Result Date: 10/27/2023    ECHOCARDIOGRAM REPORT   Patient Name:   RAFAL JIMISON Date of Exam: 10/27/2023 Medical Rec #:  [id number]      Height:       72.0 in Accession #:    [id number]     Weight:       188.1 lb Date of Birth:  [birth year]      BSA:          2.076 m Patient Age:    72 years       BP:           166/92 mmHg Patient Gender: M              HR:           90 bpm. [facility] Procedure: 2D Echo, Cardiac Doppler, Color Doppler and Intracardiac            Opacification Agent Indications:    Pulmonary embolus  History:        Patient has no prior history of Echocardiogram examinations.                 CKD3 and COPD; Risk Factors:Current Smoker, Hypertension and                 Dyslipidemia.  Sonographer:    Dondra Prader RVT RCS Referring Phys: [id number] CATHERINE A PAPPAYLIOU  Sonographer Comments: Technically challenging study due to limited acoustic windows, suboptimal subcostal window and suboptimal apical window. Image acquisition challenging due to respiratory motion, unable to breath hold and Image acquisition challenging due to COPD. IMPRESSIONS  1. Technically difficult study.  2. Left ventricular ejection fraction, by estimation, is >75%. The left ventricle has normal function. There is moderate left ventricular hypertrophy. Left ventricular diastolic parameters were normal.  3. Right ventricular is not well-seen. Right ventricular size grossly normal, systolic function visually preserved.  4. Right atrial size was visually normal.  5. The mitral valve is grossly normal. No evidence of mitral valve regurgitation. No  evidence of mitral stenosis.  6. The aortic valve is tricuspid. Aortic valve regurgitation is not visualized. No aortic stenosis is present.  7. There is dilatation of the ascending aorta, measuring 41 mm.  8. The inferior vena cava is dilated in size with >50% respiratory variability, suggesting right atrial pressure of 8 mmHg. Comparison(s): No prior Echocardiogram. FINDINGS  Left Ventricle: Left ventricular ejection fraction, by estimation, is >75%. The left ventricle has normal function. Definity contrast agent was given IV to delineate the left ventricular endocardial borders. The left ventricular internal cavity size was  small. There is moderate left ventricular hypertrophy. Left ventricular diastolic parameters were normal. Right Ventricle: Right ventricular is not well-seen. Right ventricular size grossly normal, systolic function visually preserved. Left Atrium: Left atrial size was normal in size. Right Atrium: Right atrial size was visually normal. Pericardium: There is no evidence of pericardial effusion. Mitral Valve: The mitral valve is grossly normal. No evidence of mitral valve regurgitation. No evidence of mitral valve stenosis. Tricuspid Valve: The tricuspid valve is normal in structure. Tricuspid valve regurgitation is not demonstrated. No evidence of tricuspid stenosis. Aortic Valve: The aortic valve is tricuspid. Aortic valve regurgitation is not visualized. No aortic stenosis is  present. Aortic valve mean gradient measures 3.0 mmHg. Aortic valve peak gradient measures 5.9 mmHg. Aortic valve area, by VTI measures 2.87 cm. Pulmonic Valve: The pulmonic valve was normal in structure. Pulmonic valve regurgitation is not visualized. No evidence of pulmonic stenosis. Aorta: The aortic root is normal in size and structure. There is dilatation of the ascending aorta, measuring 41 mm. Venous: The inferior vena cava is dilated in size with greater than 50% respiratory variability, suggesting right  atrial pressure of 8 mmHg. IAS/Shunts: The interatrial septum was not well visualized.  LEFT VENTRICLE PLAX 2D LVIDd:         3.70 cm   Diastology LVIDs:         2.10 cm   LV e' medial:    3.57 cm/s LV PW:         1.60 cm   LV E/e' medial:  12.3 LV IVS:        1.30 cm   LV e' lateral:   4.14 cm/s LVOT diam:     2.10 cm   LV E/e' lateral: 10.6 LV SV:         53 LV SV Index:   25 LVOT Area:     3.46 cm  RIGHT VENTRICLE             IVC RV S prime:     11.40 cm/s  IVC diam: 2.10 cm LEFT ATRIUM             Index LA diam:        3.30 cm 1.59 cm/m LA Vol (A2C):   36.6 ml 17.63 ml/m LA Vol (A4C):   37.6 ml 18.12 ml/m LA Biplane Vol: 39.6 ml 19.08 ml/m  AORTIC VALVE                    PULMONIC VALVE AV Area (Vmax):    3.14 cm     PV Vmax:       0.70 m/s AV Area (Vmean):   2.99 cm     PV Peak grad:  2.0 mmHg AV Area (VTI):     2.87 cm AV Vmax:           121.00 cm/s AV Vmean:          77.033 cm/s AV VTI:            0.183 m AV Peak Grad:      5.9 mmHg AV Mean Grad:      3.0 mmHg LVOT Vmax:         109.67 cm/s LVOT Vmean:        66.533 cm/s LVOT VTI:          0.152 m LVOT/AV VTI ratio: 0.83  AORTA Ao Root diam: 3.70 cm Ao Asc diam:  4.10 cm MITRAL VALVE MV Area (PHT): 3.77 cm     SHUNTS MV Decel Time: 201 msec     Systemic VTI:  0.15 m MV E velocity: 44.00 cm/s   Systemic Diam: 2.10 cm MV A velocity: 110.00 cm/s MV E/A ratio:  0.40 Sunit Tolia Electronically signed by Tessa Lerner Signature Date/Time: 10/27/2023/2:57:03 PM    Final    US Venous Img Lower Bilateral (DVT)  Result Date: 10/27/2023 CLINICAL DATA:  Acute pulmonary embolus. Tobacco use. Hypoxia and hospital. Respiratory failure acute. History of diabetes, obesity, COPD. Concern for DVT. EXAM: BILATERAL LOWER EXTREMITY VENOUS DOPPLER ULTRASOUND TECHNIQUE: Gray-scale sonography with graded compression, as well as color Doppler and duplex ultrasound  were performed to evaluate the lower extremity deep venous systems from the level of the common femoral vein and  including the common femoral, femoral, profunda femoral, popliteal and calf veins including the posterior tibial, peroneal and gastrocnemius veins when visible. The superficial great saphenous vein was also interrogated. Spectral Doppler was utilized to evaluate flow at rest and with distal augmentation maneuvers in the common femoral, femoral and popliteal veins. COMPARISON:  None Available. FINDINGS: RIGHT LOWER EXTREMITY Common Femoral Vein: No evidence of thrombus. Normal compressibility, respiratory phasicity and response to augmentation. Saphenofemoral Junction: No evidence of thrombus. Normal compressibility and flow on color Doppler imaging. Profunda Femoral Vein: No evidence of thrombus. Normal compressibility and flow on color Doppler imaging. Femoral Vein: No evidence of thrombus. Normal compressibility, respiratory phasicity and response to augmentation. Popliteal Vein: No evidence of thrombus. Normal compressibility, respiratory phasicity and response to augmentation. Calf Veins: No evidence of thrombus. Normal compressibility and flow on color Doppler imaging. Superficial Great Saphenous Vein: No evidence of thrombus. Normal compressibility. Venous Reflux:  None. Other Findings:  None. LEFT LOWER EXTREMITY Common Femoral Vein: No evidence of thrombus. Normal compressibility, respiratory phasicity and response to augmentation. Saphenofemoral Junction: No evidence of thrombus. Normal compressibility and flow on color Doppler imaging. Profunda Femoral Vein: No evidence of thrombus. Normal compressibility and flow on color Doppler imaging. Femoral Vein: No evidence of thrombus. Normal compressibility, respiratory phasicity and response to augmentation. Popliteal Vein: No evidence of thrombus. Normal compressibility, respiratory phasicity and response to augmentation. Calf Veins: No evidence of thrombus. Normal compressibility and flow on color Doppler imaging. Superficial Great Saphenous Vein: No evidence  of thrombus. Normal compressibility. Venous Reflux:  None. Other Findings:  None. IMPRESSION: No evidence of deep venous thrombosis in either lower extremity. Electronically Signed   By: Tish Frederickson M.D.   On: 10/27/2023 01:41   DG Abd Portable 1V  Result Date: 10/25/2023 CLINICAL DATA:  NG placement EXAM: PORTABLE ABDOMEN - 1 VIEW COMPARISON:  Abdominal radiograph dated 10/25/2023. FINDINGS: Interval progression of the enteric tube with tip and side-port in the left upper abdomen likely in the proximal stomach. IMPRESSION: Enteric tube with tip and side-port in the proximal stomach. Electronically Signed   By: Elgie Collard M.D.   On: 10/25/2023 18:15   [place] EKG SITE RITE  Result Date: 10/25/2023 If Site Rite image not attached, placement could not be confirmed due to current cardiac rhythm.  DG Abd Portable 1V  Result Date: 10/25/2023 CLINICAL DATA:  NG tube placement EXAM: PORTABLE ABDOMEN - 1 VIEW COMPARISON:  KUB 2 days prior, CT abdomen/pelvis 1 day prior FINDINGS: The enteric catheter tip is in the stomach and sidehole is in the distal esophagus. There is a nonobstructive bowel gas pattern. There is no definite free intraperitoneal air. There is no abnormal soft tissue calcification. Contrast is noted in the bladder. There is no acute osseous abnormality. IMPRESSION: Enteric catheter sidehole in the distal esophagus. Recommend advancement by approximately 8 cm. These results will be called to the ordering clinician or representative by the Radiologist Assistant, and communication documented in the PACS or [employer]. Electronically Signed   By: Lesia Hausen M.D.   On: 10/25/2023 13:34   DG CHEST PORT 1 VIEW  Result Date: 10/24/2023 CLINICAL DATA:  Intubation. EXAM: PORTABLE CHEST 1 VIEW COMPARISON:  10/23/2023. FINDINGS: The heart size and mediastinal contours are stable. There is atherosclerotic calcification of the aorta. Mild airspace disease is noted at the left lung base. No  effusion  or pneumothorax is seen. An endotracheal tube terminates 4.4 cm above the carina. The side port of an enteric tube is above the level of the diaphragm and should be advanced 14 cm. IMPRESSION: 1. Mild airspace disease at the left lung base, likely atelectasis. 2. Endotracheal tube terminates 4.4 cm above the carina. 3. The enteric tube terminates above the level of the diaphragm and should be advanced 14 cm. Electronically Signed   By: Thornell Sartorius M.D.   On: 10/24/2023 20:14   CT Angio Chest/Abd/Pel for Dissection W and/or W/WO  Result Date: 10/24/2023 CLINICAL DATA:  Arterial embolism suspected, non-extremity, determine source rule out chest PE, rule out GI ischemia per Dr. Jena Gauss EXAM: CT ANGIOGRAPHY CHEST, ABDOMEN AND PELVIS TECHNIQUE: Non-contrast CT of the chest was initially obtained. Multidetector CT imaging through the chest, abdomen and pelvis was performed using the standard protocol during bolus administration of intravenous contrast. Multiplanar reconstructed images and MIPs were obtained and reviewed to evaluate the vascular anatomy. RADIATION DOSE REDUCTION: This exam was performed according to the departmental dose-optimization program which includes automated exposure control, adjustment of the mA and/or kV according to patient size and/or use of iterative reconstruction technique. CONTRAST:  75mL OMNIPAQUE IOHEXOL 350 MG/ML SOLN COMPARISON:  10/23/2023, 09/30/2023 FINDINGS: CTA CHEST FINDINGS Cardiovascular: Heart is borderline in size. Scattered coronary artery and aortic calcifications. 4 cm ascending thoracic aortic aneurysm. No dissection. Pulmonary arteries are not adequately opacified to evaluate for pulmonary emboli on this aortic/dissection study. Mediastinum/Nodes: No mediastinal, hilar, or axillary adenopathy. Endotracheal tube in the mid to lower trachea. Thyroid and esophagus unremarkable. Lungs/Pleura: Small left pleural effusion, slightly increased since prior study.  Increasing left basilar atelectasis or infiltrate. Musculoskeletal: Chest wall soft tissues are unremarkable. No acute bony abnormality. Review of the MIP images confirms the above findings. CTA ABDOMEN AND PELVIS FINDINGS VASCULAR Aorta: Aortic atherosclerosis.  No aneurysm or dissection. Celiac: Patent without evidence of aneurysm, dissection, vasculitis or significant stenosis. SMA: Patent without evidence of aneurysm, dissection, vasculitis or significant stenosis. Renals: Single left renal artery and 2 right renal arteries. No evidence of stenosis. Scattered calcifications. IMA: Patent Inflow: Moderate calcifications through the iliofemoral vessels. No aneurysm, dissection or significant stenosis. Veins: No obvious venous abnormality within the limitations of this arterial phase study. Review of the MIP images confirms the above findings. NON-VASCULAR Hepatobiliary: No focal hepatic abnormality. Gallbladder unremarkable. Pancreas: No focal abnormality or ductal dilatation. Spleen: No focal abnormality.  Normal size. Adrenals/Urinary Tract: No adrenal nodules. Bilateral renal cysts appear benign with no additional follow-up recommended. No stones or hydronephrosis. Small layering mobile stones in the left side of the urinary bladder. Stomach/Bowel: Marked distention of the stomach again noted. Duodenal bulb is distended with fluid as well. There appears to be circumferential wall thickening within the descending duodenum with surrounding inflammation/stranding. Overall, this appears slightly worsened since prior study. Remainder of small bowel decompressed. Colonic diverticulosis. No active diverticulitis. Lymphatic: No adenopathy Reproductive: Prostate enlargement. Other: No free fluid or free air. Musculoskeletal: No acute bony abnormality. Review of the MIP images confirms the above findings. IMPRESSION: No evidence of aortic aneurysm or dissection. Aortic atherosclerosis. Mesenteric vessels appear widely  patent. Unable to assess/evaluate for pulmonary emboli given the bolus timing to adequately opacify and evaluate the aorta. Coronary artery disease. Small left pleural effusion with left basilar atelectasis or infiltrate, increasing since prior study. Marked distention of the stomach with fluid and air. This appears to be due to wall thickening and surrounding inflammation in the  region of the descending duodenum. This could be related to severe duodenitis or possibly duodenal wall hematoma. Findings concerning for gastric outlet obstruction. The patient may benefit from NG tube decompression. Electronically Signed   By: Charlett Nose M.D.   On: 10/24/2023 19:22   DG Abdomen 1 View  Result Date: 10/23/2023 CLINICAL DATA:  Nasogastric tube placement EXAM: ABDOMEN - 1 VIEW COMPARISON:  None Available. FINDINGS: The nasogastric tube is looped within the distal esophagus with its tip within the distal esophagus overlying T7. The abdominal gas pattern is nonspecific due to a paucity of intra-abdominal gas. Contrast is seen within a nondilated renal collecting system bilaterally. IMPRESSION: 1. Nasogastric tube looped within the distal esophagus with its tip within the distal esophagus overlying T7. Repositioning is recommended. Electronically Signed   By: Helyn Numbers M.D.   On: 10/23/2023 18:16   US ABDOMEN LIMITED WITH LIVER DOPPLER  Result Date: 10/23/2023 CLINICAL DATA:  Portal vein thrombus EXAM: DUPLEX ULTRASOUND OF LIVER TECHNIQUE: Color and duplex Doppler ultrasound was performed to evaluate the hepatic in-flow and out-flow vessels. COMPARISON:  CT 10/23/2023 FINDINGS: Liver: Normal parenchymal echogenicity. Normal hepatic contour without nodularity. No focal lesion, mass or intrahepatic biliary ductal dilatation. Limited images of the gallbladder are unremarkable. Main Portal Vein size: 20 mm cm Portal Vein Velocities Main Prox:  69 cm/sec Main Mid: 48 cm/sec Main Dist:  40 cm/sec Right: 29 cm/sec Left:  29 cm/sec Flow within the main, right, and left portal veins is hepatopetal (antegrade) Hepatic Vein Velocities Right:  81 cm/sec Middle:  33 cm/sec Left:  36 cm/sec Flow within hepatic veins demonstrates appropriate directional flow (hepatofugal) IVC: Present and patent with normal respiratory phasicity. Hepatic Artery Velocity:  95 cm/sec Splenic Vein Velocity:  39 cm/sec Spleen: 10.3 cm x 9.6 cm x 3.7 cm with a total volume of 191 cm^3 (411 cm^3 is upper limit normal) Portal Vein Occlusion/Thrombus: Not identified. Focal thrombus noted on prior CT examination is not visualized on the current exam. Splenic Vein Occlusion/Thrombus: No Ascites: None Varices: None IMPRESSION: 1. Normal hepatic Doppler examination. No evidence of portal vein thrombosis. Electronically Signed   By: Helyn Numbers M.D.   On: 10/23/2023 18:14   CT ABDOMEN PELVIS W CONTRAST  Result Date: 10/23/2023 CLINICAL DATA:  Several day history of abdominal pain and emesis EXAM: CT ABDOMEN AND PELVIS WITH CONTRAST TECHNIQUE: Multidetector CT imaging of the abdomen and pelvis was performed using the standard protocol following bolus administration of intravenous contrast. RADIATION DOSE REDUCTION: This exam was performed according to the departmental dose-optimization program which includes automated exposure control, adjustment of the mA and/or kV according to patient size and/or use of iterative reconstruction technique. CONTRAST:  OMNIPAQUE IOHEXOL 300 MG/ML  SOLN COMPARISON:  CT abdomen and pelvis dated 09/30/2023 FINDINGS: Lower chest: No focal consolidation or pulmonary nodule in the lung bases. Pleural effusion. Partially imaged heart size is normal. Coronary artery calcifications. Partially imaged hypodensity within a right lower lobe pulmonary artery (2:1). Hepatobiliary: No focal hepatic lesions. No intra or extrahepatic biliary ductal dilation. Normal gallbladder. Pancreas: Mild peripheral hypodensity involving the pancreatic  head and neck adjacent to the duodenal inflammation. No focal lesions or main ductal dilation. Spleen: Normal in size without focal abnormality. Adrenals/Urinary Tract: No adrenal nodules. Bilateral renal cysts, a few of which are minimally complicated. No specific follow-up imaging recommended. No hydronephrosis or calculi. Mobile stones within the urinary bladder. Stomach/Bowel: Circumferential mural thickening of the distal esophagus. Markedly distended stomach to  the level of the proximal duodenum where there is again circumferential mural thickening and heterogeneous hypoattenuation no evidence of bowel wall thickening, distention, or inflammatory changes. Colonic diverticulosis without acute diverticulitis. Normal appendix. Vascular/Lymphatic: Aortic atherosclerosis. New focal hypoattenuation within the upstream portal vein (2:26). No enlarged abdominal or pelvic lymph nodes. Reproductive: Markedly enlarged prostate gland. Other: No free fluid, fluid collection, or free air. Musculoskeletal: No acute or abnormal lytic or blastic osseous lesions. Multilevel degenerative changes of the partially imaged thoracic and lumbar spine. Small fat-containing paraumbilical hernia. IMPRESSION: 1. Markedly distended stomach to the level of the proximal duodenum where there is again circumferential mural thickening and heterogeneous hypoattenuation at the site of previously noted duodenitis. Findings suspicious for gastric outlet obstruction. 2. New focal hypoattenuation within the upstream portal vein, suspicious for nonocclusive thrombosis. 3. Partially imaged hypodensity within a right lower lobe pulmonary artery may be artifactual or represent a pulmonary embolism. 4. Mild peripheral hypodensity involving the pancreatic head and neck adjacent to the duodenal inflammation, which may be reactive. 5. Circumferential mural thickening of the distal esophagus, which may be due to esophagitis. 6. Markedly enlarged prostate  gland. 7. Mobile stones within the urinary bladder. 8.  Aortic Atherosclerosis (ICD10-I70.0). Critical Value/emergent results were called by telephone at the time of interpretation on 10/23/2023 at 1:51 pm to provider [employer], who verbally acknowledged these results. Electronically Signed   By: Agustin Cree M.D.   On: 10/23/2023 13:55   DG Chest Portable 1 View  Result Date: 10/23/2023 CLINICAL DATA:  Weakness. EXAM: PORTABLE CHEST 1 VIEW COMPARISON:  December 08, 2022. FINDINGS: The heart size and mediastinal contours are within normal limits. Both lungs are clear. The visualized skeletal structures are unremarkable. IMPRESSION: No active disease. Electronically Signed   By: Lupita Raider M.D.   On: 10/23/2023 12:11    Catarina Hartshorn, DO  [facility]  If 7PM-7AM, please contact night-coverage [email or web address] Password [employer] 10/31/2023, 5:33 PM   LOS: 8 days

## 2023-10-31 NOTE — Progress Notes (Signed)
[facility] Progress Note  5 Days Post-Op  Subjective: Patient seen and examined.  He is resting comfortably in bed.  He was able to tolerate clear liquids without nausea and vomiting.  He denies any abdominal pain at this time.  He is also passing flatus and having bowel movements.  Objective: Vital signs in last 24 hours: Temp:  [97.5 F (36.4 C)-98.3 F (36.8 C)] 97.6 F (36.4 C) (11/13 1130) Pulse Rate:  [105-112] 108 (11/13 1130) Resp:  [22-28] 22 (11/13 1130) BP: (104-125)/(81-87) 104/81 (11/13 1130) SpO2:  [92 %-100 %] 94 % (11/13 1130) Weight:  [85.7 kg] 85.7 kg (11/13 0343) Last BM Date : 10/30/23  Intake/Output from previous day: 11/12 0701 - 11/13 0700 In: 352 [P.O.:100; I.V.:252] Out: 2100 [Urine:900; Emesis/NG output:1200] Intake/Output this shift: No intake/output data recorded.  General appearance: alert, cooperative, and no distress GI: Abdomen soft, nondistended, no percussion tenderness, nontender to palpation; no rigidity, guarding, rebound tenderness; midline incision C/D/I with skin staples in place  Lab Results:  Recent Labs    10/30/23 0440 10/31/23 0435  WBC 10.0 13.2*  HGB 11.4* 11.5*  HCT 36.3* 37.1*  PLT 344 279   BMET Recent Labs    10/30/23 0440 10/31/23 0435  NA 132* 130*  K 3.6 4.0  CL 102 108  CO2 18* 15*  GLUCOSE 316* 285*  BUN 50* 72*  CREATININE 1.18 1.36*  CALCIUM 11.0* 11.1*   PT/INR No results for input(s): "LABPROT", "INR" in the last 72 hours.  Studies/Results: DG Abd 1 View  Result Date: 10/30/2023 CLINICAL DATA:  Status post bypass gastrojejunostomy. EXAM: ABDOMEN - 1 VIEW COMPARISON:  Upper GI examination on 10/28/2020 and abdominal radiograph on 10/30/2023 at 1248 hours FINDINGS: Upper abdomen is only partially imaged. There is retained contrast in the stomach. Nasogastric tube is partially imaged. There is contrast within the pelvis and right side of the abdomen. There may be a small amount of  contrast in the right colon. IMPRESSION: 1. Residual contrast in the stomach. Stomach is only partially imaged on this examination. 2. Contrast in small bowel loops and probably right colon. Electronically Signed   By: Richarda Overlie M.D.   On: 10/30/2023 16:59   DG Abd Portable 1V  Result Date: 10/30/2023 CLINICAL DATA:  Nasogastric tube placement EXAM: PORTABLE ABDOMEN - 1 VIEW COMPARISON:  10/25/2023.  Upper GI from yesterday. FINDINGS: Enteric tube tip reaches the stomach where there is opacification from contrast. Limited coverage of the upper abdomen shows no dilated bowel. Clear lung bases. IMPRESSION: Enteric tube with tip reaching the stomach where there is opacification by contrast. Electronically Signed   By: Tiburcio Pea M.D.   On: 10/30/2023 05:41   DG UGI W SINGLE CM (SOL OR THIN BA)  Result Date: 10/29/2023 CLINICAL DATA:  History of gastric outlet obstruction, post surgical creation of a gastrojejunostomy. Please perform targeted upper GI series to evaluate patency of the gastrojejunostomy as well as to exclude the presence of a leak. EXAM: DG UGI W SINGLE CM TECHNIQUE: Single contrast examination was then performed using Gastrografin. FLUOROSCOPY: 3 minutes (58.8 mGy) COMPARISON:  CT abdomen and pelvis-10/24/2023 FINDINGS: Patient was placed supine on the fluoroscopy table Preprocedural spot fluoroscopic and radiographic images were obtained of the upper abdomen demonstrating enteric tube tip and side port project over the expected location of the gastric antrum/duodenal bulb. Note is made of a midline surgical clips. Contrast was injected via the nasogastric tube with opacification of the gastric antrum  and duodenal bulb, ultimately with faint opacification of the descending portion of the duodenum. The nasogastric tube was retracted to the level of the mid body of the stomach, regional to the expected location of the surgically created gastrojejunostomy. Despite the administration of  additional contrast, obtaining delayed imaging and positioning of the fluoroscopy table at an approximately 45 degree angle, there is no definitive passage of contrast from the markedly dilated stomach through the gastrojejunostomy. Nasogastric tube was secured at the nasal brim after re-advancing the tube with tip and side port ultimately terminating within mid body of the stomach. IMPRESSION: No definitive opacification of surgically created gastrojejunostomy, potentially technique related due to persistent marked gastric distention. Above findings discussed with referring surgeon, Dr. Robyne Peers, at the time of procedure completion. Electronically Signed   By: Simonne Come M.D.   On: 10/29/2023 16:00    Anti-infectives: Anti-infectives (From admission, onward)    Start     Dose/Rate Route Frequency Ordered Stop   10/26/23 1400  ceFAZolin (ANCEF) IVPB 2g/100 mL premix        2 g 200 mL/hr over 30 Minutes Intravenous On call to O.R. 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  He is status post open gastrojejunostomy on 11/8.   -Patient with slightly increased leukocytosis today, monitor with daily labs -Upper GI performed 11/11, and contrast was stable in the stomach, and was not moving down the gastrojejunostomy.  KUB on 11/12 demonstrates contrast within the small bowel and colon -Advance to full liquids.  Patient will be limited to full liquid/soft food for 3 to 4 weeks postoperatively to allow for gastrojejunostomy to heal -TPN per hospitalist.  Would continue TPN until patient has adequate oral intake -Okay to continue heparin drip -Appreciate hospitalist recommendations   LOS: 8 days     Mumin Denomme A Puneet Masoner 10/31/2023

## 2023-10-31 NOTE — Progress Notes (Incomplete)
Progress Note  Patient became more awake shortly after arriving to ICU, he was able to open his eyes and move his extremities, though was not following commands.    BP 123/83   Pulse (!) 110   Temp 97.6 F (36.4 C) (Oral)   Resp (!) 25   Ht 6' (1.829 m)   Wt 85.7 kg   SpO2 96%   BMI 25.62 kg/m

## 2023-10-31 NOTE — Progress Notes (Signed)
PHARMACY - ANTICOAGULATION Pharmacy Consult for heparin Indication:  VTE treatment   Allergies  Allergen Reactions   Asa [Aspirin] Other (See Comments)    Stomach pain   Crestor [Rosuvastatin] Other (See Comments)    Stomach pain   Morphine Nausea And Vomiting   Wellbutrin [Bupropion] Other (See Comments)    Stomach pain   Zoloft [Sertraline Hcl] Other (See Comments)    Stomach pain    Patient Measurements: Height: 6' (182.9 cm) Weight: 85.7 kg (188 lb 15 oz) IBW/kg (Calculated) : 77.6 Heparin Dosing Weight: 88 kg  Vital Signs: Temp: 98.2 F (36.8 C) (11/13 0343) BP: 105/87 (11/13 0343) Pulse Rate: 110 (11/13 0343)  Labs: Recent Labs    10/29/23 0402 10/29/23 1311 10/29/23 2212 10/30/23 0440 10/31/23 0435  HGB 11.2*  --   --  11.4* 11.5*  HCT 36.5*  --   --  36.3* 37.1*  PLT 227  --   --  344 279  HEPARINUNFRC 0.39   < > 0.60 0.52 0.71*  CREATININE 1.00  --   --  1.18 1.36*   < > = values in this interval not displayed.    Estimated Creatinine Clearance: 53.1 mL/min (A) (by C-G formula based on SCr of 1.36 mg/dL (H)).  Assessment: 72 y.o. male with possible PE for heparin.  Ultrasound negative for portal vein thrombosis. CBC WNL  HL 0.71 slightly supratherapeutic CB WNL  Goal of Therapy:  Heparin level 0.3-0.7 units/ml Monitor platelets by anticoagulation protocol: Yes   Plan:  Decrease heparin infusion to 1950 units/hr.  Confirmatory heparin level daily Continue to monitor CBC   Judeth Cornfield, PharmD Clinical Pharmacist 10/31/2023 8:31 AM

## 2023-10-31 NOTE — Progress Notes (Signed)
Patient bladder scan revealed 650 in and out catherization , 600 retrieved

## 2023-10-31 NOTE — Progress Notes (Signed)
PHARMACY - TOTAL PARENTERAL NUTRITION CONSULT NOTE   Indication:  Gastric outlet obstruction  Patient Measurements: Height: 6' (182.9 cm) Weight: 85.7 kg (188 lb 15 oz) IBW/kg (Calculated) : 77.6 TPN AdjBW (KG): 87.2 Body mass index is 25.62 kg/m.   Assessment: Patient with gastric outlet obstruction that  is preventing him from passing any food or drink and resulting with his abdominal distention which is causing pain, nausea, and vomiting. Since this area of his duodenum has been inflamed for such a significant period of time and has caused secondary complications.  Gastrojejunostomy to bypass gastric outlet obstruction surgery completed 11/8. Plan to perform upper GI Monday (11/11) to evaluate for any leak depending on NGTube output which has been high.  Glucose / Insulin: BS 243-316. Novolog 48 units given in past 24 hours, semglee dose adjusted- adding insulin to TPN Electrolytes: Na 130 K 3.6 Phos 2.2 > 3.4  Renal: stable Hepatic: TG 179 GI Imaging/procedures:EGD 11/6 showed distal duodenal obstruction.    Central access: 11/8 PICC line TPN start date: 11/8  Nutritional Goals: Goal TPN rate is 70 mL/hr + 250 mL lipids (50g)(provides 134 g of protein and 2040 kcals per day)  RD Assessment: Estimated Needs Total Energy Estimated Needs: 2300-2600 Total Protein Estimated Needs: 120-135 gm Total Fluid Estimated Needs: 2.3-2.6 L  Current Nutrition:  NPO  Plan:  Continue TPN @ 28mL/hr  + lipids 20% infused over 12 hours daily Electrolytes in TPN: Na acetate 15 mEq/L, Na phos 20 mEq/L, KCl 10 mEq/L, K acetate 20 mEq/L Ca 0 mEq/L, Mg 73mEq/L, Cl:Ac 1:1 Add standard MVI and trace elements to TPN Add 50 units insulin to TPN Continue resistant q4h SSI and adjust as needed. Monitor TPN labs on Mon/Thurs   Judeth Cornfield, PharmD Clinical Pharmacist 10/31/2023 8:34 AM

## 2023-10-31 NOTE — Progress Notes (Signed)
Matthew Molina and Matthew Molina notified by this nurse via telephone that  patient was transferred to ICU. Patient belongings taken to ICU and placed in his room.

## 2023-10-31 NOTE — Progress Notes (Incomplete)
Progress Note  Patient became more awake shortly after arriving to ICU, he was able to open his eyes and move his extremities, though was not following commands.

## 2023-10-31 NOTE — Plan of Care (Signed)
  Problem: Acute Rehab PT Goals(only PT should resolve) Goal: Pt Will Go Supine/Side To Sit Outcome: Progressing Flowsheets (Taken 10/31/2023 1219) Pt will go Supine/Side to Sit:  with moderate assist  with minimal assist Goal: Patient Will Transfer Sit To/From Stand Outcome: Progressing Flowsheets (Taken 10/31/2023 1219) Patient will transfer sit to/from stand:  with minimal assist  with moderate assist Goal: Pt Will Transfer Bed To Chair/Chair To Bed Outcome: Progressing Flowsheets (Taken 10/31/2023 1219) Pt will Transfer Bed to Chair/Chair to Bed: with mod assist Goal: Pt Will Ambulate Outcome: Progressing Flowsheets (Taken 10/31/2023 1219) Pt will Ambulate:  15 feet  with moderate assist  with rolling walker   12:19 PM, 10/31/23 Ocie Bob, MPT Physical Therapist with [facility] [phone] office [phone] mobile phone

## 2023-10-31 NOTE — [place] FL2 (Signed)
[place] MEDICAID FL2 LEVEL OF CARE FORM     IDENTIFICATION  Patient Name: Matthew Molina Birthdate: 07/09/1951 Sex: male Admission Date (Current Location): 10/23/2023  [facility] and [place] Number:  [employer] and Address:  [facility],  [address] [place] [address]      Provider Number: [id number]  Attending Physician Name and Address:  Catarina Hartshorn, MD  Relative Name and Phone Number:       Current Level of Care: Hospital Recommended Level of Care: Skilled Nursing Facility Prior Approval Number:    Date Approved/Denied:   PASRR Number: [id number]  Discharge Plan: SNF    Current Diagnoses: Patient Active Problem List   Diagnosis Date Noted   On total parenteral nutrition (TPN) 10/28/2023   Protein-calorie malnutrition, severe 10/26/2023   Portal vein thrombosis 10/23/2023   Chronic kidney disease, stage 3a (HCC) 10/23/2023   Gastric outlet obstruction 10/23/2023   Small bowel obstruction (HCC) 10/23/2023   Hypoglycemia 10/14/2023   Iron deficiency anemia 10/02/2023   Nausea and vomiting 10/01/2023   Peptic ulcer disease 09/30/2023   RML pneumonia 09/30/2023   Duodenitis 09/18/2023   Type 2 diabetes mellitus with hyperlipidemia (HCC) 08/09/2023   Chronic pancreatitis (HCC) 06/30/2023   Weight loss, non-intentional 06/30/2023   Acute pancreatitis 04/30/2023   Encounter for support and coordination of transition of care 12/17/2022   COPD exacerbation (HCC) 12/08/2022   Influenza A 12/08/2022   Encounter for Medicare annual examination with abnormal findings 09/19/2022   Hip pain, right 09/19/2022   Shoulder pain, left 08/09/2022   Arm pain 01/27/2022   Multiple environmental allergies 01/02/2022   Pes planus 08/03/2021   Abnormal LFTs 06/28/2021   Benign prostatic hyperplasia with urinary obstruction 10/11/2020   Emphysema lung (HCC) 09/19/2020   Tubular adenoma of colon 09/13/2020   Incomplete emptying of bladder 09/08/2020    Musculoskeletal leg pain, right 08/11/2020   Hip pain, bilateral 08/11/2020   Hemorrhoids 03/18/2020   Muscle pain 01/22/2020   QT prolongation 01/14/2020   Chronic respiratory failure with hypoxia (HCC) 01/06/2020   Diverticulosis of colon    Urinary incontinence 10/08/2019   Unsteady gait 04/18/2017   Elevated PSA 10/01/2016   GERD (gastroesophageal reflux disease) 08/24/2016   Chronic kidney disease, stage 3b (HCC) 05/22/2016   COPD (chronic obstructive pulmonary disease) (HCC) 11/05/2015   Hypertension 11/05/2015   Tobacco abuse 07/24/2015   Coronary atherosclerosis of native coronary artery 04/02/2015   Multiple lung nodules on CT 04/02/2015   GAD (generalized anxiety disorder) 07/04/2012   Back pain with radiation 11/01/2011   Hyperlipidemia 06/02/2008   Obesity (BMI 30.0-34.9) 06/02/2008   Schizophrenia, unspecified type (HCC) 06/02/2008    Orientation RESPIRATION BLADDER Height & Weight     Self, Place  Normal Incontinent, External catheter Weight: 188 lb 15 oz (85.7 kg) Height:  6' (182.9 cm)  BEHAVIORAL SYMPTOMS/MOOD NEUROLOGICAL BOWEL NUTRITION STATUS      Incontinent Diet (See D/C summary)  AMBULATORY STATUS COMMUNICATION OF NEEDS Skin   Extensive Assist Verbally Normal                       Personal Care Assistance Level of Assistance  Bathing, Feeding, Dressing Bathing Assistance: Limited assistance Feeding assistance: Independent Dressing Assistance: Limited assistance     Functional Limitations Info  Sight, Hearing, Speech Sight Info: Adequate Hearing Info: Adequate Speech Info: Impaired    SPECIAL CARE FACTORS FREQUENCY  PT (By licensed PT), OT (By licensed  OT)     PT Frequency: 5 times weekly OT Frequency: 5 times weekly            Contractures Contractures Info: Not present    Additional Factors Info  Code Status, Allergies Code Status Info: FULL Allergies Info: Asa (Aspirin), Crestor (Rosuvastatin), Morphine, Wellbutrin  (Bupropion), Zoloft (Sertraline Hcl)           Current Medications (10/31/2023):  This is the current hospital active medication list Current Facility-Administered Medications  Medication Dose Route Frequency Provider Last Rate Last Admin   .TPN (CLINIMIX-E) Adult   Intravenous Continuous TPN Laural Benes, Clanford L, MD 70 mL/hr at 10/30/23 1745 New Bag at 10/30/23 1745   acetaminophen (TYLENOL) 160 MG/5ML solution 650 mg  650 mg Oral Q6H Johnson, Clanford L, MD   650 mg at 10/31/23 0912   arformoterol (BROVANA) nebulizer solution 15 mcg  15 mcg Nebulization BID Pappayliou, Catherine A, DO   15 mcg at 10/31/23 0729   bisacodyl (DULCOLAX) EC tablet 10 mg  10 mg Oral Daily Johnson, Clanford L, MD   10 mg at 10/31/23 0912   budesonide (PULMICORT) nebulizer solution 0.5 mg  0.5 mg Nebulization BID Pappayliou, Catherine A, DO   0.5 mg at 10/31/23 0729   busPIRone (BUSPAR) tablet 7.5 mg  7.5 mg Oral TID Laural Benes, Clanford L, MD   7.5 mg at 10/31/23 0912   Chlorhexidine Gluconate Cloth 2 % PADS 6 each  6 each Topical Daily Pappayliou, Catherine A, DO   6 each at 10/31/23 0941   docusate sodium (COLACE) capsule 100 mg  100 mg Oral BID Johnson, Clanford L, MD   100 mg at 10/31/23 0912   TPN (CLINIMIX) Adult with Electrolyte Additives   Intravenous Continuous TPN Tat, Onalee Hua, MD       And   fat emulsion 20 % (SMOFLIPID) infusion  250 mL Intravenous Continuous TPN Tat, Onalee Hua, MD       fentaNYL (SUBLIMAZE) injection 25 mcg  25 mcg Intravenous Q2H PRN Pappayliou, Catherine A, DO   25 mcg at 10/26/23 2303   heparin ADULT infusion 100 units/mL (25000 units/221mL)  1,950 Units/hr Intravenous Continuous Tat, David, MD 19.5 mL/hr at 10/31/23 0943 1,950 Units/hr at 10/31/23 0943   insulin aspart (novoLOG) injection 0-20 Units  0-20 Units Subcutaneous Q4H Johnson, Clanford L, MD   9 Units at 10/31/23 1220   insulin aspart (novoLOG) injection 2 Units  2 Units Subcutaneous Colman Cater, MD   2 Units at 10/31/23 1219    insulin glargine-yfgn (SEMGLEE) injection 30 Units  30 Units Subcutaneous Daily Johnson, Clanford L, MD   30 Units at 10/31/23 0912   ipratropium-albuterol (DUONEB) 0.5-2.5 (3) MG/3ML nebulizer solution 3 mL  3 mL Nebulization TID Pappayliou, Catherine A, DO   3 mL at 10/31/23 0729   metoprolol tartrate (LOPRESSOR) injection 5 mg  5 mg Intravenous Q6H Johnson, Clanford L, MD   5 mg at 10/31/23 1221   montelukast (SINGULAIR) tablet 10 mg  10 mg Oral QHS Johnson, Clanford L, MD   10 mg at 10/30/23 2231   Oral care mouth rinse  15 mL Mouth Rinse PRN Pappayliou, Catherine A, DO       oxyCODONE (Oxy IR/ROXICODONE) immediate release tablet 5 mg  5 mg Oral Q6H PRN Johnson, Clanford L, MD   5 mg at 10/30/23 1721   pantoprazole (PROTONIX) injection 40 mg  40 mg Intravenous Q12H Pappayliou, Catherine A, DO   40 mg at 10/31/23 [phone]  polyethylene glycol (MIRALAX / GLYCOLAX) packet 17 g  17 g Oral Daily Johnson, Clanford L, MD   17 g at 10/31/23 0950   prochlorperazine (COMPAZINE) injection 10 mg  10 mg Intravenous Q6H PRN Pappayliou, Catherine A, DO       risperiDONE (RISPERDAL) tablet 1 mg  1 mg Oral QHS Johnson, Clanford L, MD   1 mg at 10/30/23 2231   sodium chloride flush (NS) 0.9 % injection 10-40 mL  10-40 mL Intracatheter Q12H Johnson, Clanford L, MD   10 mL at 10/29/23 2138   sodium chloride flush (NS) 0.9 % injection 10-40 mL  10-40 mL Intracatheter PRN Johnson, Clanford L, MD       sucralfate (CARAFATE) 1 GM/10ML suspension 1 g  1 g Oral TID WC & HS Johnson, Clanford L, MD   1 g at 10/31/23 1219   tamsulosin (FLOMAX) capsule 0.4 mg  0.4 mg Oral QPC supper Laural Benes, Clanford L, MD   0.4 mg at 10/30/23 1722     Discharge Medications: Please see discharge summary for a list of discharge medications.  Relevant Imaging Results:  Relevant Lab Results:   Additional Information SSN: [id number] [place]

## 2023-10-31 NOTE — Plan of Care (Signed)

## 2023-10-31 NOTE — Progress Notes (Signed)
   10/31/23 1646  Assess: MEWS Score  Temp 97.6 F (36.4 C)  BP 119/85  MAP (mmHg) 96  Pulse Rate (!) 114  Resp (!) 30  Level of Consciousness Alert  SpO2 98 %  O2 Device Room Air  Assess: MEWS Score  MEWS Temp 0  MEWS Systolic 0  MEWS Pulse 2  MEWS RR 2  MEWS LOC 0  MEWS Score 4  MEWS Score Color Red  Assess: if the MEWS score is Yellow or Red  Were vital signs accurate and taken at a resting state? Yes  Does the patient have a confirmed or suspected source of infection? Yes  MEWS guidelines implemented  Yes, red  Treat  MEWS Interventions Considered administering scheduled or prn medications/treatments as ordered  Take Vital Signs  Increase Vital Sign Frequency  Red: Q1hr x2, continue Q4hrs until patient remains green for 12hrs  Escalate  MEWS: Escalate Red: Discuss with charge nurse and notify provider. Consider notifying RRT. If remains red for 2 hours consider need for higher level of care  Notify: Charge Nurse/RN  Name of Charge Nurse/RN Notified  Teacher, adult education ray)  Provider Notification  Provider Name/Title Tat  Date Provider Notified 10/31/23  Time Provider Notified 1701  Method of Notification Page  Notification Reason Change in status  Provider response Other (Comment)  Assess: SIRS CRITERIA  SIRS Temperature  0  SIRS Pulse 1  SIRS Respirations  1  SIRS WBC 0  SIRS Score Sum  2

## 2023-10-31 NOTE — Progress Notes (Signed)
During bedside report this Clinical research associate and Andrey Campanile, RN noted patient to have declined more since notified Dr. Arbutus Leas of patient decline., Dr. Arbutus Leas ordered labs and they have just resulted.  Rapid response called and new orders placed to transfer patient to ICU . Transfer of care to [facility] . Dr. Marisa Severin and Dr. Thomes Dinning at bedside. Patient is less responsive at this time also noted periods of apnea when breathing patient placed on 4L of o2

## 2023-10-31 NOTE — TOC Progression Note (Signed)
Transition of Care [facility]) - Progression Note    Patient Details  Name: Matthew Molina MRN: [id number] Date of Birth: 05-Oct-1951  Transition of [facility] CM/SW Contact  [place], [place] Phone Number: 10/31/2023, 1:41 PM  Clinical Narrative:    CSW updated that PT is recommending SNF for pt at D/C. CSW spoke with pts sister Michelene Heady who states she feels this will be needed for pt. She is agreeable to SNF referral being sent out locally. TOC to follow.   Expected Discharge Plan: Home/Self Care Barriers to Discharge: Continued Medical Work up  Expected Discharge Plan and Services In-house Referral: Clinical Social Work Discharge Planning Services: CM Consult   Living arrangements for the past 2 months: Single Family Home                                       Social Determinants of Health (SDOH) Interventions SDOH Screenings   Food Insecurity: No Food Insecurity (10/23/2023)  Housing: Low Risk  (10/23/2023)  Transportation Needs: No Transportation Needs (10/23/2023)  Utilities: Not At Risk (10/23/2023)  Alcohol Screen: Low Risk  (11/27/2022)  Depression (PHQ2-9): Low Risk  (10/12/2023)  Recent Concern: Depression (PHQ2-9) - High Risk (08/02/2023)  Financial Resource Strain: Low Risk  (12/13/2022)  Physical Activity: Inactive (11/27/2022)  Social Connections: Moderately Isolated (11/27/2022)  Stress: No Stress Concern Present (11/27/2022)  Tobacco Use: High Risk (10/26/2023)    Readmission Risk Interventions    10/25/2023   10:00 AM 10/24/2023   11:51 AM 10/01/2023    2:57 PM  Readmission Risk Prevention Plan  Transportation Screening Complete Complete Complete  Home Care Screening  Complete   Medication Review (RN CM)  Complete   HRI or Home Care Consult Complete  Complete  Social Work Consult for Recovery Care Planning/Counseling Complete  Complete  Palliative Care Screening Not Applicable  Not Applicable  Medication Review Oceanographer) Complete   Complete

## 2023-10-31 NOTE — Progress Notes (Signed)
Dr. Arbutus Leas at bedside ordered EKG , CXR and lab work , Patient able to tell his DOB at this time. Will recheck VS   10/31/23 1646  Vitals  Temp 97.6 F (36.4 C)  Temp Source Axillary  BP 119/85  MAP (mmHg) 96  BP Location Left Arm  BP Method Automatic  Patient Position (if appropriate) Sitting  Pulse Rate (!) 114  Pulse Rate Source Dinamap  Resp (!) 30  Level of Consciousness  Level of Consciousness Alert  MEWS COLOR  MEWS Score Color Red  Oxygen Therapy  SpO2 98 %  O2 Device Room Air  MEWS Score  MEWS Temp 0  MEWS Systolic 0  MEWS Pulse 2  MEWS RR 2  MEWS LOC 0  MEWS Score 4  Provider Notification  Provider Name/Title Tat  Date Provider Notified 10/31/23  Time Provider Notified 1701  Method of Notification Page  Notification Reason Change in status  Provider response Other (Comment)

## 2023-10-31 NOTE — Inpatient Diabetes Management (Signed)
Inpatient Diabetes Program Recommendations  AACE/ADA: New Consensus Statement on Inpatient Glycemic Control (2015)  Target Ranges:  Prepandial:   less than 140 mg/dL      Peak postprandial:   less than 180 mg/dL (1-2 hours)      Critically ill patients:  140 - 180 mg/dL   Lab Results  Component Value Date   GLUCAP 255 (H) 10/31/2023   HGBA1C 6.0 (H) 10/12/2023    Latest Reference Range & Units 10/30/23 07:24 10/30/23 11:11 10/30/23 16:32 10/30/23 20:08 10/30/23 23:49 10/31/23 03:42 10/31/23 08:06  Glucose-Capillary 70 - 99 mg/dL 960 (H) 454 (H) 098 (H) 283 (H) 318 (H) 289 (H) 255 (H)  (H): Data is abnormally high  Diabetes history: DM Outpatient Diabetes medications: Farxiga 5 mg daily  Current orders for Inpatient glycemic control: Semglee 30 units daily, Novolog 0-20 units q 4 hrs. Correction, TPN with 50 units insulin  Inpatient Diabetes Program Recommendations:   Please consider: -Add Novolog 2 units q 4 hrs.  Thank you, Billy Fischer. Kyi Romanello, RN, MSN, CDCES  Diabetes Coordinator Inpatient Glycemic Control Team Team Pager [phone] (8am-5pm) 10/31/2023 10:31 AM

## 2023-10-31 NOTE — Progress Notes (Addendum)
Rapid response called change in mental status.  72 year old male with history of coronary artery disease, COPD, IgG MGUS,, admitted 11/5 with manage for severe distal duodenal obstruction, status post gastric tube jejunostomy 10/26/2023, currently on TPN started 11/9.  Also on anticoagulation for questionable portal vein thrombosis and PE.   Onset of acute respiratory failure with respiratory distress 11/13, concern for aspiration started on Zosyn empirically.   ABG shows pH of 7.4, pCO2 of 22.  Procalcitonin 0.28.  Lactic acid 1.7.  Blood cultures obtained. Chest x-ray-no acute abnormality.  UA -not convincing for UTI. Vitals currently show temperature of 98.2, heart rate of 112, respirate rate of 26, blood pressure 113/82.  Sats 96 to 100%, placed on 2 L. Sodium 130, increasing creatinine 1.4.  On exam, chronically and acutely ill-appearing, dry mucous membranes, somnolent, equal breath sounds bilaterally, responds to vigorous touch-groans.  Patient is full code. -Transferred to stepdown -Question dehydration, infectious etiology  - 1 L bolus - Obtain Head CT -Continue antibiotics, follow-up blood cultures -Remain NPO till improvement in mental status    CRITICAL CARE Performed by: Onnie Boer   Total critical care time: 50 minutes  Critical care time was exclusive of separately billable procedures and treating other patients.  Critical care was necessary to treat or prevent imminent or life-threatening deterioration.  Critical care was time spent personally by me on the following activities: development of treatment plan with patient and/or surrogate as well as nursing, discussions with consultants, evaluation of patient's response to treatment, examination of patient, obtaining history from patient or surrogate, ordering and performing treatments and interventions, ordering and review of laboratory studies, ordering and review of radiographic studies, pulse oximetry and  re-evaluation of patient's condition.  Pepper Wyndham E Ailie Gage 10/31/23 8:08 PM

## 2023-10-31 NOTE — Evaluation (Signed)
Physical Therapy Evaluation Patient Details Name: Matthew Molina MRN: [id number] DOB: May 25, 1951 Today's Date: 10/31/2023  History of Present Illness  Matthew Molina is a 72 year old male with a history of IgG MGUS, COPD, diabetes mellitus type 2, hypertension, hyperlipidemia, depression, tobacco abuse, coronary disease presenting with generalized weakness, abdominal pain, nausea and vomiting.  The patient was recently admitted to the hospital from 09/30/2023 to 10/03/2023.  At that time, the patient had nausea and vomiting and poor oral intake and upper abdominal pain as well.  The patient was treated for sepsis secondary to aspiration pneumonia.  GI was consulted during the admission.  The patient underwent EGD on 10/02/2023 which showed grade B esophagitis without bleeding, nonbleeding gastric ulcers with a clean base, and duodenitis.  The patient was discharged home with PPI twice daily and sulcrafate.  The patient states that he was feeling well after d/c and he was tolerating a soft diet. However, he but began having gradual abdominal pain and belching in the past 1 week.  He states that things have gradually worsened over the past 3 days prior to admission with increasing belching and hiccups that has resulted in vomiting every time he belches.  He has had poor oral intake and progressive generalized weakness.  He also complains of worsening mid abdominal pain.  He denies f/c, cp, sob, hemoptysis, diarrhea, hematochezia, melena, dysuria, hematuria.  As result of abd pain with n/v, the patient presented for further evaluation and treatment.  He continues to smoke a few cigarette's a day.  Notably, patient states he ate spaghetti and chocolate chip cookies before he came to ED and did not have any emesis.   Clinical Impression  Patient demonstrates slow labored movement for sitting up at bedside, frequent leaning to the left once seated, has difficulty completing sit to stands and taking steps at  bedside due to frequent buckling of knees, poor standing balance and generalized weakness.  Patient tolerated sitting up in chair after therapy with caregiver present.  Patient will benefit from continued skilled physical therapy in hospital and recommended venue below to increase strength, balance, endurance for safe ADLs and gait.           If plan is discharge home, recommend the following: A lot of help with bathing/dressing/bathroom;A lot of help with walking and/or transfers;Help with stairs or ramp for entrance;Assistance with cooking/housework   Can travel by private vehicle   No    Equipment Recommendations None recommended by PT  Recommendations for Other Services       Functional Status Assessment Patient has had a recent decline in their functional status and demonstrates the ability to make significant improvements in function in a reasonable and predictable amount of time.     Precautions / Restrictions Precautions Precautions: Fall Restrictions Weight Bearing Restrictions: No      Mobility  Bed Mobility Overal bed mobility: Needs Assistance Bed Mobility: Supine to Sit     Supine to sit: Mod assist, Max assist     General bed mobility comments: increased time, labored movement    Transfers Overall transfer level: Needs assistance Equipment used: Rolling walker (2 wheels) Transfers: Sit to/from Stand, Bed to chair/wheelchair/BSC Sit to Stand: Mod assist, Max assist   Step pivot transfers: Mod assist, Max assist       General transfer comment: unsteady  labored movement with frequent buckling of knees    Ambulation/Gait Ambulation/Gait assistance: Max assist Gait Distance (Feet): 4 Feet Assistive device: Rolling walker (2 wheels)  Gait Pattern/deviations: Decreased step length - right, Decreased step length - left, Decreased stride length, Knees buckling, Shuffle Gait velocity: slow     General Gait Details: limited to a few slow labored  shuffling side steps with frequent buckling of knees  Stairs            Wheelchair Mobility     Tilt Bed    Modified Rankin (Stroke Patients Only)       Balance Overall balance assessment: Needs assistance Sitting-balance support: Feet supported, No upper extremity supported Sitting balance-Leahy Scale: Poor Sitting balance - Comments: fair/poor seated at EOB   Standing balance support: Reliant on assistive device for balance, During functional activity, Bilateral upper extremity supported Standing balance-Leahy Scale: Poor Standing balance comment: using RW                             Pertinent Vitals/Pain Pain Assessment Pain Assessment: No/denies pain    Home Living Family/patient expects to be discharged to:: Private residence Living Arrangements: Alone Available Help at Discharge: Personal care attendant;Available PRN/intermittently Type of Home: Apartment Home Access: Elevator       Home Layout: One level Home Equipment: Agricultural consultant (2 wheels);Cane - single point;Shower seat      Prior Function Prior Level of Function : Needs assist       Physical Assist : Mobility (physical);ADLs (physical) Mobility (physical): Bed mobility;Transfers;Gait;Stairs   Mobility Comments: household ambulator using SPC, uses electric carts in stores ADLs Comments: Has Home aid care giver daily     Extremity/Trunk Assessment   Upper Extremity Assessment Upper Extremity Assessment: Generalized weakness    Lower Extremity Assessment Lower Extremity Assessment: Generalized weakness    Cervical / Trunk Assessment Cervical / Trunk Assessment: Normal  Communication   Communication Communication: Difficulty communicating thoughts/reduced clarity of speech Cueing Techniques: Verbal cues;Tactile cues  Cognition Arousal: Alert Behavior During Therapy: Flat affect Overall Cognitive Status: Impaired/Different from baseline Area of Impairment: Attention,  Following commands, Safety/judgement, Awareness                   Current Attention Level: Alternating   Following Commands: Follows one step commands with increased time Safety/Judgement: Decreased awareness of deficits     General Comments: Requires repeated verbal/tactile curing for safety        General Comments      Exercises     Assessment/Plan    PT Assessment Patient needs continued PT services  PT Problem List Decreased strength;Decreased activity tolerance;Decreased balance;Decreased mobility       PT Treatment Interventions DME instruction;Gait training;Functional mobility training;Therapeutic activities;Therapeutic exercise;Balance training;Patient/family education    PT Goals (Current goals can be found in the Care Plan section)  Acute Rehab PT Goals Patient Stated Goal: return home PT Goal Formulation: With patient/family Time For Goal Achievement: 11/14/23 Potential to Achieve Goals: Good    Frequency Min 3X/week     Co-evaluation               AM-PAC PT "6 Clicks" Mobility  Outcome Measure Help needed turning from your back to your side while in a flat bed without using bedrails?: A Lot Help needed moving from lying on your back to sitting on the side of a flat bed without using bedrails?: A Lot Help needed moving to and from a bed to a chair (including a wheelchair)?: A Lot Help needed standing up from a chair using your arms (e.g., wheelchair or  bedside chair)?: A Lot Help needed to walk in hospital room?: A Lot Help needed climbing 3-5 steps with a railing? : Total 6 Click Score: 11    End of Session   Activity Tolerance: Patient tolerated treatment well;Patient limited by fatigue Patient left: in chair;with call bell/phone within reach;with chair alarm set;with family/visitor present Nurse Communication: Mobility status PT Visit Diagnosis: Unsteadiness on feet (R26.81);Other abnormalities of gait and mobility (R26.89);Muscle  weakness (generalized) (M62.81)    Time: 1610-9604 PT Time Calculation (min) (ACUTE ONLY): 33 min   Charges:   PT Evaluation $PT Eval Moderate Complexity: 1 Mod PT Treatments $Therapeutic Activity: 23-37 mins PT General Charges $$ ACUTE PT VISIT: 1 Visit         12:16 PM, 10/31/23 Ocie Bob, MPT Physical Therapist with [facility] [phone] office [phone] mobile phone

## 2023-10-31 NOTE — Evaluation (Signed)
Clinical/Bedside Swallow Evaluation Patient Details  Name: Matthew Molina MRN: [id number] Date of Birth: 1951-10-04  Today's Date: 10/31/2023 Time: SLP Start Time (ACUTE ONLY): 1440 SLP Stop Time (ACUTE ONLY): 1509 SLP Time Calculation (min) (ACUTE ONLY): 29 min  Past Medical History:  Past Medical History:  Diagnosis Date   Acute blood loss anemia 01/05/2020   Acute respiratory failure with hypoxia (HCC)    Arthritis    COPD (chronic obstructive pulmonary disease) (HCC)    Depression    Diabetes mellitus    Diabetes mellitus without complication (HCC)    Diverticulitis    Elevated PSA 10/01/2016   Encounter for support and coordination of transition of care 01/14/2020   Hypercholesterolemia    Hyperlipidemia    Hypertension    Insomnia 01/05/2016   Ischemic colitis (HCC) 01/05/2020   Multiple lung nodules on CT 04/02/2015   Nicotine addiction    Obesity    Oxygen deficiency    qhs   Schizophrenia [facility])    Past Surgical History:  Past Surgical History:  Procedure Laterality Date   BIOPSY  05/28/2023   Procedure: BIOPSY;  Surgeon: Lanelle Bal, DO;  [facility];  Service: Endoscopy;;   BIOPSY  10/02/2023   Procedure: BIOPSY;  Surgeon: Dolores Frame, MD;  [facility];  Service: Gastroenterology;;   BIOPSY  10/24/2023   Procedure: BIOPSY;  Surgeon: Corbin Ade, MD;  [facility];  Service: Endoscopy;;   CATARACT EXTRACTION W/PHACO Left 11/20/2013   Procedure: CATARACT EXTRACTION PHACO AND INTRAOCULAR LENS PLACEMENT (IOC);  Surgeon: Gemma Payor, MD;  Location: [facility];  Service: Ophthalmology;  Laterality: Left;  CDE:10.26   CATARACT EXTRACTION W/PHACO Right 12/08/2013   Procedure: RIGHT EYE CATARACT EXTRACTION PHACO AND INTRAOCULAR LENS PLACEMENT ;  Surgeon: Gemma Payor, MD;  Location: [facility];  Service: Ophthalmology;  Laterality: Right;  CDE 12.38   COLONOSCOPY N/A 04/01/2013   pancolonic diverticulosis, redundant colon,  large internal hemorrhoids.    COLONOSCOPY  2014   INCOMPLETE PREP IN R COLON   COLONOSCOPY N/A 04/01/2020   Procedure: COLONOSCOPY;  Surgeon: West Bali, MD;  [facility];  Service: Endoscopy;  Laterality: N/A;  8:45am   ESOPHAGOGASTRODUODENOSCOPY N/A 02/18/2016   stricture at GE junction, moderate erosive gastritis and mild non-erosive duodenitis. +H.pylori. Treated initially with Prevpac. Breath test to check for eradication was positive, and he was prescribed Pylera.    ESOPHAGOGASTRODUODENOSCOPY (EGD) WITH PROPOFOL N/A 05/28/2023   Procedure: ESOPHAGOGASTRODUODENOSCOPY (EGD) WITH PROPOFOL;  Surgeon: Lanelle Bal, DO;  [facility];  Service: Endoscopy;  Laterality: N/A;  11:15am, asa 3   ESOPHAGOGASTRODUODENOSCOPY (EGD) WITH PROPOFOL N/A 10/02/2023   Procedure: ESOPHAGOGASTRODUODENOSCOPY (EGD) WITH PROPOFOL;  Surgeon: Dolores Frame, MD;  [facility];  Service: Gastroenterology;  Laterality: N/A;   ESOPHAGOGASTRODUODENOSCOPY (EGD) WITH PROPOFOL N/A 10/24/2023   Procedure: ESOPHAGOGASTRODUODENOSCOPY (EGD) WITH PROPOFOL;  Surgeon: Corbin Ade, MD;  [facility];  Service: Endoscopy;  Laterality: N/A;   EYE SURGERY Left 11/2013   cataract extraction   POLYPECTOMY  04/01/2020   Procedure: POLYPECTOMY;  Surgeon: West Bali, MD;  [facility];  Service: Endoscopy;;   SAVORY DILATION N/A 02/18/2016   Procedure: SAVORY DILATION;  Surgeon: West Bali, MD;  [facility];  Service: Endoscopy;  Laterality: N/A;   SHOULDER SURGERY Right    HPI:  Matthew Molina is a 72 year old male with a history  of IgG MGUS, COPD, diabetes mellitus type 2, hypertension, hyperlipidemia, depression, tobacco abuse, coronary disease presenting with generalized weakness, abdominal pain, nausea and vomiting.  The patient was recently admitted to the hospital from 09/30/2023 to 10/03/2023.  At that time, the patient had nausea  and vomiting and poor oral intake and upper abdominal pain as well.  The patient was treated for sepsis secondary to aspiration pneumonia.  GI was consulted during the admission.  The patient underwent EGD on 10/02/2023 which showed grade B esophagitis without bleeding, nonbleeding gastric ulcers with a clean base, and duodenitis.  The patient was discharged home with PPI twice daily and sulcrafate.  The patient states that he was feeling well after d/c and he was tolerating a soft diet. However, he but began having gradual abdominal pain and belching in the past 1 week.  He states that things have gradually worsened over the past 3 days prior to admission with increasing belching and hiccups that has resulted in vomiting every time he belches.  He has had poor oral intake and progressive generalized weakness.  He also complains of worsening mid abdominal pain.  He denies f/c, cp, sob, hemoptysis, diarrhea, hematochezia, melena, dysuria, hematuria.  As result of abd pain with n/v, the patient presented for further evaluation and treatment.  He continues to smoke a few cigarette's a day.  Notably, patient states he ate spaghetti and chocolate chip cookies before he came to ED and did not have any emesis.    Assessment / Plan / Recommendation  Clinical Impression  Clinical swallowing evaluation completed while Pt was sitting upright in chair. Pt with a significant delay when asked questions and a very soft vocal quality but he did respond to questions if provided time. Pt is on full liquid diet s/p open gastrojejunostomy on 11/8. Pt was assessed with liquids only. Pt demonstrated immediate weak wet cough with almost every sip of thin liquids provided via cup and straw. No overt s/sx of aspiration were observed with NTL trials. Recommend downgrade full liquid diet to NECTAR thick liquids. Recommend consider instrumental assessment to objectively assess the swallowing function. ST will continue to follow, thank you  for this referral, SLP Visit Diagnosis: Dysphagia, unspecified (R13.10)    Aspiration Risk  Mild aspiration risk;Moderate aspiration risk    Diet Recommendation Nectar-thick liquid (Full liquid diet)    Liquid Administration via: Cup Medication Administration: Crushed with puree Supervision: Staff to assist with self feeding;Full supervision/cueing for compensatory strategies Compensations: Slow rate;Small sips/bites Postural Changes: Seated upright at 90 degrees    Other  Recommendations Oral Care Recommendations: Oral care BID Caregiver Recommendations: Avoid jello, ice cream, thin soups, popsicles;Remove water pitcher;Have oral suction available    Recommendations for follow up therapy are one component of a multi-disciplinary discharge planning process, led by the attending physician.  Recommendations may be updated based on patient status, additional functional criteria and insurance authorization.  Follow up Recommendations Skilled nursing-short term rehab (<3 hours/day)      Assistance Recommended at Discharge    Functional Status Assessment    Frequency and Duration min 2x/week  1 week       Prognosis Prognosis for improved oropharyngeal function: Fair      Swallow Study   General Date of Onset: 10/23/23 HPI: TASEEN ASIA is a 72 year old male with a history of IgG MGUS, COPD, diabetes mellitus type 2, hypertension, hyperlipidemia, depression, tobacco abuse, coronary disease presenting with generalized weakness, abdominal pain, nausea and vomiting.  The patient  was recently admitted to the hospital from 09/30/2023 to 10/03/2023.  At that time, the patient had nausea and vomiting and poor oral intake and upper abdominal pain as well.  The patient was treated for sepsis secondary to aspiration pneumonia.  GI was consulted during the admission.  The patient underwent EGD on 10/02/2023 which showed grade B esophagitis without bleeding, nonbleeding gastric ulcers with a clean  base, and duodenitis.  The patient was discharged home with PPI twice daily and sulcrafate.  The patient states that he was feeling well after d/c and he was tolerating a soft diet. However, he but began having gradual abdominal pain and belching in the past 1 week.  He states that things have gradually worsened over the past 3 days prior to admission with increasing belching and hiccups that has resulted in vomiting every time he belches.  He has had poor oral intake and progressive generalized weakness.  He also complains of worsening mid abdominal pain.  He denies f/c, cp, sob, hemoptysis, diarrhea, hematochezia, melena, dysuria, hematuria.  As result of abd pain with n/v, the patient presented for further evaluation and treatment.  He continues to smoke a few cigarette's a day.  Notably, patient states he ate spaghetti and chocolate chip cookies before he came to ED and did not have any emesis. Type of Study: Bedside Swallow Evaluation Previous Swallow Assessment: none in chart Diet Prior to this Study: Full liquid diet Temperature Spikes Noted: No Respiratory Status: Room air History of Recent Intubation: No Behavior/Cognition: Alert;Cooperative;Pleasant mood Oral Cavity Assessment: Within Functional Limits Oral Care Completed by SLP: Recent completion by staff Oral Cavity - Dentition: Edentulous Vision: Functional for self-feeding Self-Feeding Abilities: Needs assist;Needs set up Patient Positioning: Upright in chair Baseline Vocal Quality: Normal Volitional Cough: Weak;Wet Volitional Swallow: Unable to elicit    Oral/Motor/Sensory Function     Ice Chips     Thin Liquid Thin Liquid: Impaired Presentation: Cup;Straw Oral Phase Impairments: Poor awareness of bolus;Reduced labial seal Oral Phase Functional Implications: Prolonged oral transit;Oral holding Pharyngeal  Phase Impairments: Cough - Delayed;Cough - Immediate;Multiple swallows    Nectar Thick Nectar Thick Liquid: Within  functional limits Presentation: Cup   Honey Thick Honey Thick Liquid: Not tested   Puree Puree: Not tested   Solid     Solid: Not tested     Shanna Strength H. Romie Levee, CCC-SLP Speech Language Pathologist  Georgetta Haber 10/31/2023,3:10 PM

## 2023-11-01 ENCOUNTER — Encounter (HOSPITAL_COMMUNITY): Payer: Self-pay | Admitting: Surgery

## 2023-11-01 ENCOUNTER — Inpatient Hospital Stay (HOSPITAL_COMMUNITY): Payer: 59

## 2023-11-01 ENCOUNTER — Other Ambulatory Visit (HOSPITAL_COMMUNITY): Payer: Self-pay

## 2023-11-01 DIAGNOSIS — G9341 Metabolic encephalopathy: Secondary | ICD-10-CM | POA: Diagnosis not present

## 2023-11-01 DIAGNOSIS — J9601 Acute respiratory failure with hypoxia: Secondary | ICD-10-CM | POA: Diagnosis not present

## 2023-11-01 DIAGNOSIS — Z720 Tobacco use: Secondary | ICD-10-CM | POA: Diagnosis not present

## 2023-11-01 DIAGNOSIS — G9340 Encephalopathy, unspecified: Secondary | ICD-10-CM | POA: Diagnosis not present

## 2023-11-01 DIAGNOSIS — R6521 Severe sepsis with septic shock: Secondary | ICD-10-CM

## 2023-11-01 DIAGNOSIS — A419 Sepsis, unspecified organism: Secondary | ICD-10-CM | POA: Diagnosis not present

## 2023-11-01 DIAGNOSIS — K311 Adult hypertrophic pyloric stenosis: Secondary | ICD-10-CM | POA: Diagnosis not present

## 2023-11-01 LAB — COMPREHENSIVE METABOLIC PANEL
ALT: 65 U/L — ABNORMAL HIGH (ref 0–44)
ALT: 39 U/L (ref 0–44)
AST: 60 U/L — ABNORMAL HIGH (ref 15–41)
AST: 31 U/L (ref 15–41)
Albumin: 3.2 g/dL — ABNORMAL LOW (ref 3.5–5.0)
Albumin: 1.6 g/dL — ABNORMAL LOW (ref 3.5–5.0)
Alkaline Phosphatase: 48 U/L (ref 38–126)
Alkaline Phosphatase: 24 U/L — ABNORMAL LOW (ref 38–126)
Anion gap: 14 (ref 5–15)
Anion gap: 14 (ref 5–15)
BUN: 81 mg/dL — ABNORMAL HIGH (ref 8–23)
BUN: 66 mg/dL — ABNORMAL HIGH (ref 8–23)
CO2: 17 mmol/L — ABNORMAL LOW (ref 22–32)
CO2: 14 mmol/L — ABNORMAL LOW (ref 22–32)
Calcium: 11.9 mg/dL — ABNORMAL HIGH (ref 8.9–10.3)
Calcium: 7.4 mg/dL — ABNORMAL LOW (ref 8.9–10.3)
Chloride: 104 mmol/L (ref 98–111)
Chloride: 88 mmol/L — ABNORMAL LOW (ref 98–111)
Creatinine, Ser: 1.52 mg/dL — ABNORMAL HIGH (ref 0.61–1.24)
Creatinine, Ser: 1.93 mg/dL — ABNORMAL HIGH (ref 0.61–1.24)
GFR, Estimated: 48 mL/min — ABNORMAL LOW (ref >60)
GFR, Estimated: 36 mL/min — ABNORMAL LOW (ref >60)
Glucose, Bld: 155 mg/dL — ABNORMAL HIGH (ref 70–99)
Glucose, Bld: 603 mg/dL (ref 70–99)
Potassium: 3.8 mmol/L (ref 3.5–5.1)
Potassium: 3.3 mmol/L — ABNORMAL LOW (ref 3.5–5.1)
Sodium: 135 mmol/L (ref 135–145)
Sodium: 116 mmol/L — CL (ref 135–145)
Total Bilirubin: 0.9 mg/dL (ref <1.2)
Total Bilirubin: 3.2 mg/dL — ABNORMAL HIGH (ref <1.2)
Total Protein: 7.5 g/dL (ref 6.5–8.1)
Total Protein: 4.1 g/dL — ABNORMAL LOW (ref 6.5–8.1)

## 2023-11-01 LAB — BASIC METABOLIC PANEL
Anion gap: 11 (ref 5–15)
Anion gap: 13 (ref 5–15)
BUN: 81 mg/dL — ABNORMAL HIGH (ref 8–23)
BUN: 93 mg/dL — ABNORMAL HIGH (ref 8–23)
CO2: 17 mmol/L — ABNORMAL LOW (ref 22–32)
CO2: 18 mmol/L — ABNORMAL LOW (ref 22–32)
Calcium: 10.5 mg/dL — ABNORMAL HIGH (ref 8.9–10.3)
Calcium: 11.7 mg/dL — ABNORMAL HIGH (ref 8.9–10.3)
Chloride: 104 mmol/L (ref 98–111)
Chloride: 104 mmol/L (ref 98–111)
Creatinine, Ser: 1.55 mg/dL — ABNORMAL HIGH (ref 0.61–1.24)
Creatinine, Ser: 2.8 mg/dL — ABNORMAL HIGH (ref 0.61–1.24)
GFR, Estimated: 23 mL/min — ABNORMAL LOW (ref >60)
GFR, Estimated: 47 mL/min — ABNORMAL LOW (ref >60)
Glucose, Bld: 152 mg/dL — ABNORMAL HIGH (ref 70–99)
Glucose, Bld: 228 mg/dL — ABNORMAL HIGH (ref 70–99)
Potassium: 3.9 mmol/L (ref 3.5–5.1)
Potassium: 4.8 mmol/L (ref 3.5–5.1)
Sodium: 133 mmol/L — ABNORMAL LOW (ref 135–145)
Sodium: 134 mmol/L — ABNORMAL LOW (ref 135–145)

## 2023-11-01 LAB — LIPASE, BLOOD: Lipase: 105 U/L — ABNORMAL HIGH (ref 11–51)

## 2023-11-01 LAB — PHOSPHORUS: Phosphorus: 4.1 mg/dL (ref 2.5–4.6)

## 2023-11-01 LAB — HEMOGLOBIN A1C
Hgb A1c MFr Bld: 6 % — ABNORMAL HIGH (ref 4.8–5.6)
Mean Plasma Glucose: 125.5 mg/dL

## 2023-11-01 LAB — LIPID PANEL
Cholesterol: 95 mg/dL (ref 0–200)
HDL: 30 mg/dL — ABNORMAL LOW (ref >40)
LDL Cholesterol: 35 mg/dL (ref 0–99)
Total CHOL/HDL Ratio: 3.2 {ratio}
Triglycerides: 151 mg/dL — ABNORMAL HIGH (ref <150)
VLDL: 30 mg/dL (ref 0–40)

## 2023-11-01 LAB — POCT I-STAT 7, (LYTES, BLD GAS, ICA,H+H)
Acid-base deficit: 12 mmol/L — ABNORMAL HIGH (ref 0.0–2.0)
Bicarbonate: 14.4 mmol/L — ABNORMAL LOW (ref 20.0–28.0)
Calcium, Ion: 1.61 mmol/L (ref 1.15–1.40)
HCT: 38 % — ABNORMAL LOW (ref 39.0–52.0)
Hemoglobin: 12.9 g/dL — ABNORMAL LOW (ref 13.0–17.0)
O2 Saturation: 96 %
Patient temperature: 97.1
Potassium: 5.2 mmol/L — ABNORMAL HIGH (ref 3.5–5.1)
Sodium: 135 mmol/L (ref 135–145)
TCO2: 15 mmol/L — ABNORMAL LOW (ref 22–32)
pCO2 arterial: 33.1 mm[Hg] (ref 32–48)
pH, Arterial: 7.242 — ABNORMAL LOW (ref 7.35–7.45)
pO2, Arterial: 95 mm[Hg] (ref 83–108)

## 2023-11-01 LAB — CBC WITH DIFFERENTIAL/PLATELET
Abs Immature Granulocytes: 0 10*3/uL (ref 0.00–0.07)
Basophils Absolute: 0 10*3/uL (ref 0.0–0.1)
Basophils Relative: 0 %
Eosinophils Absolute: 0.1 10*3/uL (ref 0.0–0.5)
Eosinophils Relative: 1 %
HCT: 38.7 % — ABNORMAL LOW (ref 39.0–52.0)
Hemoglobin: 11.9 g/dL — ABNORMAL LOW (ref 13.0–17.0)
Lymphocytes Relative: 8 %
Lymphs Abs: 1.2 10*3/uL (ref 0.7–4.0)
MCH: 26.7 pg (ref 26.0–34.0)
MCHC: 30.7 g/dL (ref 30.0–36.0)
MCV: 87 fL (ref 80.0–100.0)
Monocytes Absolute: 1.3 10*3/uL — ABNORMAL HIGH (ref 0.1–1.0)
Monocytes Relative: 9 %
Neutro Abs: 11.9 10*3/uL — ABNORMAL HIGH (ref 1.7–7.7)
Neutrophils Relative %: 82 %
Platelets: 178 10*3/uL (ref 150–400)
RBC: 4.45 MIL/uL (ref 4.22–5.81)
RDW: 16.8 % — ABNORMAL HIGH (ref 11.5–15.5)
WBC: 14.5 10*3/uL — ABNORMAL HIGH (ref 4.0–10.5)
nRBC: 0.2 % (ref 0.0–0.2)

## 2023-11-01 LAB — CBC
HCT: 33.5 % — ABNORMAL LOW (ref 39.0–52.0)
Hemoglobin: 9.7 g/dL — ABNORMAL LOW (ref 13.0–17.0)
MCH: 27.8 pg (ref 26.0–34.0)
MCHC: 29 g/dL — ABNORMAL LOW (ref 30.0–36.0)
MCV: 96 fL (ref 80.0–100.0)
Platelets: 83 10*3/uL — ABNORMAL LOW (ref 150–400)
RBC: 3.49 MIL/uL — ABNORMAL LOW (ref 4.22–5.81)
RDW: 18.1 % — ABNORMAL HIGH (ref 11.5–15.5)
WBC: 9.1 10*3/uL (ref 4.0–10.5)
nRBC: 1 % — ABNORMAL HIGH (ref 0.0–0.2)

## 2023-11-01 LAB — LACTIC ACID, PLASMA
Lactic Acid, Venous: 2.5 mmol/L (ref 0.5–1.9)
Lactic Acid, Venous: 2 mmol/L (ref 0.5–1.9)
Lactic Acid, Venous: 1.7 mmol/L (ref 0.5–1.9)
Lactic Acid, Venous: 3.7 mmol/L (ref 0.5–1.9)

## 2023-11-01 LAB — PROCALCITONIN: Procalcitonin: 1.42 ng/mL

## 2023-11-01 LAB — BLOOD GAS, ARTERIAL
Acid-base deficit: 8.2 mmol/L — ABNORMAL HIGH (ref 0.0–2.0)
Bicarbonate: 15.8 mmol/L — ABNORMAL LOW (ref 20.0–28.0)
Drawn by: 41977
O2 Saturation: 96.3 %
Patient temperature: 36.4
pCO2 arterial: 27 mm[Hg] — ABNORMAL LOW (ref 32–48)
pH, Arterial: 7.37 (ref 7.35–7.45)
pO2, Arterial: 74 mm[Hg] — ABNORMAL LOW (ref 83–108)

## 2023-11-01 LAB — GLUCOSE, CAPILLARY
Glucose-Capillary: 116 mg/dL — ABNORMAL HIGH (ref 70–99)
Glucose-Capillary: 127 mg/dL — ABNORMAL HIGH (ref 70–99)
Glucose-Capillary: 129 mg/dL — ABNORMAL HIGH (ref 70–99)
Glucose-Capillary: 147 mg/dL — ABNORMAL HIGH (ref 70–99)
Glucose-Capillary: 147 mg/dL — ABNORMAL HIGH (ref 70–99)
Glucose-Capillary: 148 mg/dL — ABNORMAL HIGH (ref 70–99)
Glucose-Capillary: 151 mg/dL — ABNORMAL HIGH (ref 70–99)
Glucose-Capillary: 165 mg/dL — ABNORMAL HIGH (ref 70–99)
Glucose-Capillary: 193 mg/dL — ABNORMAL HIGH (ref 70–99)
Glucose-Capillary: 207 mg/dL — ABNORMAL HIGH (ref 70–99)
Glucose-Capillary: 208 mg/dL — ABNORMAL HIGH (ref 70–99)
Glucose-Capillary: 497 mg/dL — ABNORMAL HIGH (ref 70–99)

## 2023-11-01 LAB — HEPARIN LEVEL (UNFRACTIONATED): Heparin Unfractionated: 0.57 [IU]/mL (ref 0.30–0.70)

## 2023-11-01 LAB — CORTISOL-AM, BLOOD: Cortisol - AM: >100 ug/dL — ABNORMAL HIGH (ref 6.7–22.6)

## 2023-11-01 LAB — MRSA NEXT GEN BY PCR, NASAL: MRSA by PCR Next Gen: NOT DETECTED

## 2023-11-01 LAB — BRAIN NATRIURETIC PEPTIDE: B Natriuretic Peptide: 26 pg/mL (ref 0.0–100.0)

## 2023-11-01 LAB — MAGNESIUM: Magnesium: 2.2 mg/dL (ref 1.7–2.4)

## 2023-11-01 LAB — VITAMIN D 25 HYDROXY (VIT D DEFICIENCY, FRACTURES): Vit D, 25-Hydroxy: 63.86 ng/mL (ref 30–100)

## 2023-11-01 MED ORDER — STERILE WATER FOR INJECTION IV SOLN
INTRAVENOUS | Status: AC
  Filled 2023-11-01 (×2): qty 1000

## 2023-11-01 MED ORDER — INSULIN REGULAR(HUMAN) IN NACL 100-0.9 UT/100ML-% IV SOLN
INTRAVENOUS | Status: DC
  Administered 2023-11-01: 12 [IU]/h via INTRAVENOUS
  Filled 2023-11-01: qty 100

## 2023-11-01 MED ORDER — FENTANYL CITRATE (PF) 100 MCG/2ML IJ SOLN
INTRAMUSCULAR | Status: AC
  Filled 2023-11-01: qty 2

## 2023-11-01 MED ORDER — DOCUSATE SODIUM 50 MG/5ML PO LIQD: 100.0000 mg | Freq: Two times a day (BID) | ORAL | Status: DC

## 2023-11-01 MED ORDER — FENTANYL CITRATE PF 50 MCG/ML IJ SOSY
PREFILLED_SYRINGE | INTRAMUSCULAR | Status: AC
  Filled 2023-11-01: qty 2

## 2023-11-01 MED ORDER — INSULIN ASPART 100 UNIT/ML IJ SOLN
0.0000 [IU] | INTRAMUSCULAR | Status: DC
  Administered 2023-11-01 – 2023-11-02 (×2): 2 [IU] via SUBCUTANEOUS
  Administered 2023-11-02 (×2): 3 [IU] via SUBCUTANEOUS

## 2023-11-01 MED ORDER — SODIUM CHLORIDE 0.9 % IV SOLN: INTRAVENOUS | Status: DC

## 2023-11-01 MED ORDER — ETOMIDATE 2 MG/ML IV SOLN
INTRAVENOUS | Status: AC
  Filled 2023-11-01: qty 20

## 2023-11-01 MED ORDER — FENTANYL CITRATE PF 50 MCG/ML IJ SOSY
25.0000 ug | PREFILLED_SYRINGE | INTRAMUSCULAR | Status: DC | PRN
  Administered 2023-11-01 – 2023-11-02 (×4): 50 ug via INTRAVENOUS
  Filled 2023-11-01: qty 1

## 2023-11-01 MED ORDER — SODIUM BICARBONATE 8.4 % IV SOLN
100.0000 meq | Freq: Once | INTRAVENOUS | Status: AC
  Administered 2023-11-01: 100 meq via INTRAVENOUS
  Filled 2023-11-01: qty 50

## 2023-11-01 MED ORDER — FENTANYL 2500MCG IN NS 250ML (10MCG/ML) PREMIX INFUSION
25.0000 ug/h | INTRAVENOUS | Status: DC
  Administered 2023-11-01: 25 ug/h via INTRAVENOUS
  Administered 2023-11-02 – 2023-11-03 (×2): 100 ug/h via INTRAVENOUS
  Filled 2023-11-01 (×3): qty 250

## 2023-11-01 MED ORDER — DEXTROSE 5 % IV SOLN: INTRAVENOUS | Status: DC

## 2023-11-01 MED ORDER — POLYETHYLENE GLYCOL 3350 17 G PO PACK
17.0000 g | PACK | Freq: Every day | ORAL | Status: DC
  Administered 2023-11-01: 17 g
  Filled 2023-11-01: qty 1

## 2023-11-01 MED ORDER — IPRATROPIUM-ALBUTEROL 0.5-2.5 (3) MG/3ML IN SOLN: 3.0000 mL | RESPIRATORY_TRACT | Status: DC | PRN

## 2023-11-01 MED ORDER — MIDAZOLAM HCL 2 MG/2ML IJ SOLN
INTRAMUSCULAR | Status: AC
  Filled 2023-11-01: qty 2

## 2023-11-01 MED ORDER — ACETAMINOPHEN 160 MG/5ML PO SOLN
650.0000 mg | Freq: Four times a day (QID) | ORAL | Status: DC
  Administered 2023-11-01 – 2023-11-02 (×2): 650 mg
  Filled 2023-11-01 (×3): qty 20.3

## 2023-11-01 MED ORDER — KETAMINE HCL 50 MG/5ML IJ SOSY
PREFILLED_SYRINGE | INTRAMUSCULAR | Status: AC
  Administered 2023-11-01: 25 mg
  Filled 2023-11-01: qty 10

## 2023-11-01 MED ORDER — LACTATED RINGERS IV BOLUS
500.0000 mL | Freq: Once | INTRAVENOUS | Status: AC
  Administered 2023-11-01: 500 mL via INTRAVENOUS

## 2023-11-01 MED ORDER — DOCUSATE SODIUM 50 MG/5ML PO LIQD
100.0000 mg | Freq: Two times a day (BID) | ORAL | Status: DC
  Administered 2023-11-01: 100 mg
  Filled 2023-11-01: qty 10

## 2023-11-01 MED ORDER — POLYETHYLENE GLYCOL 3350 17 G PO PACK: 17.0000 g | PACK | Freq: Every day | ORAL | Status: DC | PRN

## 2023-11-01 MED ORDER — POLYETHYLENE GLYCOL 3350 17 G PO PACK: 17.0000 g | PACK | Freq: Every day | ORAL | Status: DC

## 2023-11-01 MED ORDER — ROCURONIUM BROMIDE 10 MG/ML (PF) SYRINGE
PREFILLED_SYRINGE | INTRAVENOUS | Status: AC
  Filled 2023-11-01: qty 10

## 2023-11-01 MED ORDER — FENTANYL CITRATE PF 50 MCG/ML IJ SOSY
25.0000 ug | PREFILLED_SYRINGE | INTRAMUSCULAR | Status: DC | PRN
  Filled 2023-11-01 (×2): qty 1

## 2023-11-01 MED ORDER — FAMOTIDINE 20 MG PO TABS
20.0000 mg | ORAL_TABLET | Freq: Two times a day (BID) | ORAL | Status: DC
Start: 2023-11-01 — End: 2023-11-01

## 2023-11-01 MED ORDER — OXYCODONE HCL 5 MG/5ML PO SOLN
5.0000 mg | Freq: Four times a day (QID) | ORAL | Status: DC | PRN
  Administered 2023-11-01: 5 mg
  Filled 2023-11-01: qty 5

## 2023-11-01 MED ORDER — LACTATED RINGERS IV BOLUS
1000.0000 mL | Freq: Once | INTRAVENOUS | Status: AC
  Administered 2023-11-01: 1000 mL via INTRAVENOUS

## 2023-11-01 MED ORDER — ETOMIDATE 2 MG/ML IV SOLN
INTRAVENOUS | Status: AC
  Administered 2023-11-01: 10 mg
  Filled 2023-11-01: qty 20

## 2023-11-01 MED ORDER — VASOPRESSIN 20 UNITS/100 ML INFUSION FOR SHOCK
0.0000 [IU]/min | INTRAVENOUS | Status: DC
  Administered 2023-11-01 – 2023-11-03 (×5): 0.03 [IU]/min via INTRAVENOUS
  Filled 2023-11-01 (×4): qty 100

## 2023-11-01 MED ORDER — VASOPRESSIN 20 UNITS/100 ML INFUSION FOR SHOCK
INTRAVENOUS | Status: AC
  Filled 2023-11-01: qty 100

## 2023-11-01 MED ORDER — HYDROCORTISONE SOD SUC (PF) 100 MG IJ SOLR
100.0000 mg | Freq: Three times a day (TID) | INTRAMUSCULAR | Status: DC
  Administered 2023-11-01 – 2023-11-03 (×6): 100 mg via INTRAVENOUS
  Filled 2023-11-01 (×6): qty 2

## 2023-11-01 MED ORDER — VANCOMYCIN HCL 1750 MG/350ML IV SOLN
1750.0000 mg | Freq: Once | INTRAVENOUS | Status: AC
  Administered 2023-11-01: 1750 mg via INTRAVENOUS
  Filled 2023-11-01: qty 350

## 2023-11-01 MED ORDER — FENTANYL BOLUS VIA INFUSION
25.0000 ug | INTRAVENOUS | Status: DC | PRN
Start: 2023-11-01 — End: 2023-11-04
  Administered 2023-11-02: 25 ug via INTRAVENOUS
  Administered 2023-11-02 (×2): 50 ug via INTRAVENOUS

## 2023-11-01 MED ORDER — VANCOMYCIN HCL 1250 MG/250ML IV SOLN
1250.0000 mg | INTRAVENOUS | Status: DC
  Filled 2023-11-01: qty 250

## 2023-11-01 MED ORDER — PHENYLEPHRINE 80 MCG/ML (10ML) SYRINGE FOR IV PUSH (FOR BLOOD PRESSURE SUPPORT)
PREFILLED_SYRINGE | INTRAVENOUS | Status: AC
  Filled 2023-11-01: qty 10

## 2023-11-01 MED ORDER — NOREPINEPHRINE 4 MG/250ML-% IV SOLN
0.0000 ug/min | INTRAVENOUS | Status: DC
  Administered 2023-11-01: 30 ug/min via INTRAVENOUS
  Administered 2023-11-01: 32 ug/min via INTRAVENOUS
  Administered 2023-11-01: 26 ug/min via INTRAVENOUS
  Filled 2023-11-01 (×4): qty 250

## 2023-11-01 MED ORDER — FENTANYL CITRATE PF 50 MCG/ML IJ SOSY: 50.0000 ug | PREFILLED_SYRINGE | Freq: Once | INTRAMUSCULAR | Status: DC

## 2023-11-01 MED ORDER — MAGNESIUM SULFATE 2 GM/50ML IV SOLN
2.0000 g | Freq: Once | INTRAVENOUS | Status: AC
  Administered 2023-11-01: 2 g via INTRAVENOUS
  Filled 2023-11-01: qty 50

## 2023-11-01 MED ORDER — INSULIN ASPART 100 UNIT/ML IJ SOLN: 0.0000 [IU] | INTRAMUSCULAR | Status: DC

## 2023-11-01 MED ORDER — POTASSIUM CHLORIDE 10 MEQ/50ML IV SOLN
10.0000 meq | INTRAVENOUS | Status: AC
  Administered 2023-11-01 (×4): 10 meq via INTRAVENOUS
  Filled 2023-11-01 (×2): qty 50

## 2023-11-01 MED ORDER — NOREPINEPHRINE 4 MG/250ML-% IV SOLN
INTRAVENOUS | Status: AC
  Administered 2023-11-01: 2 ug/min via INTRAVENOUS
  Filled 2023-11-01: qty 250

## 2023-11-01 MED ORDER — ORAL CARE MOUTH RINSE
15.0000 mL | OROMUCOSAL | Status: DC
  Administered 2023-11-01 – 2023-11-09 (×92): 15 mL via OROMUCOSAL

## 2023-11-01 MED ORDER — NOREPINEPHRINE 16 MG/250ML-% IV SOLN
0.0000 ug/min | INTRAVENOUS | Status: DC
  Administered 2023-11-01 (×2): 38 ug/min via INTRAVENOUS
  Administered 2023-11-02: 32 ug/min via INTRAVENOUS
  Administered 2023-11-02: 24 ug/min via INTRAVENOUS
  Administered 2023-11-02: 27 ug/min via INTRAVENOUS
  Administered 2023-11-03: 6 ug/min via INTRAVENOUS
  Administered 2023-11-06: 2 ug/min via INTRAVENOUS
  Administered 2023-11-08: 3 ug/min via INTRAVENOUS
  Administered 2023-11-08: 1 ug/min via INTRAVENOUS
  Filled 2023-11-01 (×6): qty 250

## 2023-11-01 MED ORDER — LACTATED RINGERS IV SOLN: INTRAVENOUS | Status: DC

## 2023-11-01 MED ORDER — DEXTROSE 50 % IV SOLN: 0.0000 mL | INTRAVENOUS | Status: DC | PRN

## 2023-11-01 MED ORDER — LACTATED RINGERS IV SOLN: INTRAVENOUS | Status: AC

## 2023-11-01 MED ORDER — FENTANYL CITRATE PF 50 MCG/ML IJ SOSY
25.0000 ug | PREFILLED_SYRINGE | Freq: Once | INTRAMUSCULAR | Status: AC
  Administered 2023-11-01: 25 ug via INTRAVENOUS
  Filled 2023-11-01: qty 1

## 2023-11-01 MED ORDER — DOCUSATE SODIUM 50 MG/5ML PO LIQD: 100.0000 mg | Freq: Two times a day (BID) | ORAL | Status: DC | PRN

## 2023-11-01 MED ORDER — SUCCINYLCHOLINE CHLORIDE 200 MG/10ML IV SOSY
PREFILLED_SYRINGE | INTRAVENOUS | Status: AC
  Administered 2023-11-01: 100 mg
  Filled 2023-11-01: qty 10

## 2023-11-01 MED ORDER — FUROSEMIDE 10 MG/ML IJ SOLN
40.0000 mg | Freq: Once | INTRAMUSCULAR | Status: AC
  Administered 2023-11-01: 40 mg via INTRAVENOUS
  Filled 2023-11-01: qty 4

## 2023-11-01 MED ORDER — PROPOFOL 1000 MG/100ML IV EMUL
0.0000 ug/kg/min | INTRAVENOUS | Status: DC
  Administered 2023-11-01: 5 ug/kg/min via INTRAVENOUS
  Administered 2023-11-02: 25 ug/kg/min via INTRAVENOUS
  Filled 2023-11-01 (×3): qty 100

## 2023-11-01 MED ORDER — ORAL CARE MOUTH RINSE: 15.0000 mL | OROMUCOSAL | Status: DC | PRN

## 2023-11-01 MED ORDER — BISACODYL 10 MG RE SUPP
10.0000 mg | Freq: Every day | RECTAL | Status: DC
  Administered 2023-11-01 – 2023-11-07 (×7): 10 mg via RECTAL
  Filled 2023-11-01 (×8): qty 1

## 2023-11-01 NOTE — Progress Notes (Signed)
PT Cancellation Note  Patient Details Name: Matthew Molina MRN: [id number] DOB: 08/20/1951   Cancelled Treatment:    Reason Eval/Treat Not Completed: Medical issues which prohibited therapy.  Patient transferred to a higher level of care and will need new PT consult to resume therapy when patient is medically stable.  Thank you.    7:47 AM, 11/01/23 Ocie Bob, MPT Physical Therapist with [facility] [phone] office [phone] mobile phone

## 2023-11-01 NOTE — TOC Benefit Eligibility Note (Signed)
Patient Product/process development scientist completed.    The patient is insured through [facility]. Patient has Medicare and is not eligible for a copay card, but may be able to apply for patient assistance, if available.    Ran test claim for Eliquis 5 mg and the current 30 day co-pay is $0.00.   This test claim was processed through [facility]- copay amounts may vary at other pharmacies due to pharmacy/plan contracts, or as the patient moves through the different stages of their insurance plan.     Roland Earl, CPHT Pharmacy Technician III Certified Patient Advocate [facility] Pharmacy Patient Advocate Team Direct Number: [phone]  Fax: [phone]

## 2023-11-01 NOTE — TOC Progression Note (Signed)
Transition of Care [facility]) - Progression Note    Patient Details  Name: Matthew Molina MRN: [id number] Date of Birth: December 11, 1951  Transition of Care [facility] CM/SW Contact  Villa Herb, [place] Phone Number: 11/01/2023, 10:59 AM  Clinical Narrative:    CSW spoke with pts daughter and she prefers to be the main point of contact for pt. CSW reviewed bed offers for SNF. Pts daughter feels that family would prefer for pt to be close and stay in [place]. CSW explained facility in [place] that offered is [facility]. Pts daughter to review and TOC will follow up closer to medical stability. TOC to follow.   Expected Discharge Plan: Home/Self Care Barriers to Discharge: Continued Medical Work up  Expected Discharge Plan and Services In-house Referral: Clinical Social Work Discharge Planning Services: CM Consult   Living arrangements for the past 2 months: Single Family Home                                       Social Determinants of Health (SDOH) Interventions SDOH Screenings   Food Insecurity: No Food Insecurity (10/23/2023)  Housing: Low Risk  (10/23/2023)  Transportation Needs: No Transportation Needs (10/23/2023)  Utilities: Not At Risk (10/23/2023)  Alcohol Screen: Low Risk  (11/27/2022)  Depression (PHQ2-9): Low Risk  (10/12/2023)  Recent Concern: Depression (PHQ2-9) - High Risk (08/02/2023)  Financial Resource Strain: Low Risk  (12/13/2022)  Physical Activity: Inactive (11/27/2022)  Social Connections: Moderately Isolated (11/27/2022)  Stress: No Stress Concern Present (11/27/2022)  Tobacco Use: High Risk (10/26/2023)    Readmission Risk Interventions    10/25/2023   10:00 AM 10/24/2023   11:51 AM 10/01/2023    2:57 PM  Readmission Risk Prevention Plan  Transportation Screening Complete Complete Complete  Home Care Screening  Complete   Medication Review (RN CM)  Complete   HRI or Home Care Consult Complete  Complete  Social Work Consult for Recovery  Care Planning/Counseling Complete  Complete  Palliative Care Screening Not Applicable  Not Applicable  Medication Review Oceanographer) Complete  Complete

## 2023-11-01 NOTE — Consult Note (Signed)
[facility] Consult Note  VAHIN DISCHER [birth year]  [id number].    Requesting MD: Theophilus Kinds Chief Complaint/Reason for Consult: GOO  HPI:  Matthew Molina is a 72 y.o. male PMH IgG MGUS, COPD, DM, HTN, HLD, depression, tobacco abuse, coronary disease who recently underwent Gastrojejunostomy on 10/26/23 by Dr. Robyne Peers for gastric outlet obstruction thought to be secondary to peptic ulcer disease stricture/duodenitis and portal vein thrombosis. He underwent an UGI on 11/11 which just showed contrast in the stomach, then on subsequent KUB 11/12 the contrast was in small bowel/colon so NG was removed and he was started on CLD. Over night he has worsening mentation and CT head demonstrated question of possible stroke. This morning he become less responsive and required intubation as well as hypotension requiring pressor support. Patient was transferred to [facility] ICU for critical care. During this work up he also underwent repeat CT a/p which shows small amount of free air but no free fluid, and heterogeneous masslike density in the region of the first and second portion of the duodenum with some new central fluid areas of attenuation that may represent hematoma, mass and/or infection. General surgery asked to follow here at [facility] for assistance with diet management.   Family History  Problem Relation Age of Onset   Diabetes Mother    Hypertension Mother    Stroke Mother    Diabetes Sister    Heart disease Sister    Kidney disease Father    Drug abuse Brother    Colon cancer Neg Hx    Colon polyps Neg Hx     Past Medical History:  Diagnosis Date   Acute blood loss anemia 01/05/2020   Acute respiratory failure with hypoxia (HCC)    Arthritis    COPD (chronic obstructive pulmonary disease) (HCC)    Depression    Diabetes mellitus    Diabetes mellitus without complication (HCC)    Diverticulitis    Elevated PSA 10/01/2016   Encounter for support and coordination of  transition of care 01/14/2020   Hypercholesterolemia    Hyperlipidemia    Hypertension    Insomnia 01/05/2016   Ischemic colitis (HCC) 01/05/2020   Multiple lung nodules on CT 04/02/2015   Nicotine addiction    Obesity    Oxygen deficiency    qhs   Schizophrenia (HCC)     Past Surgical History:  Procedure Laterality Date   BIOPSY  05/28/2023   Procedure: BIOPSY;  Surgeon: Lanelle Bal, DO;  [facility];  Service: Endoscopy;;   BIOPSY  10/02/2023   Procedure: BIOPSY;  Surgeon: Dolores Frame, MD;  [facility];  Service: Gastroenterology;;   BIOPSY  10/24/2023   Procedure: BIOPSY;  Surgeon: Corbin Ade, MD;  [facility];  Service: Endoscopy;;   CATARACT EXTRACTION W/PHACO Left 11/20/2013   Procedure: CATARACT EXTRACTION PHACO AND INTRAOCULAR LENS PLACEMENT (IOC);  Surgeon: Gemma Payor, MD;  Location: [facility];  Service: Ophthalmology;  Laterality: Left;  CDE:10.26   CATARACT EXTRACTION W/PHACO Right 12/08/2013   Procedure: RIGHT EYE CATARACT EXTRACTION PHACO AND INTRAOCULAR LENS PLACEMENT ;  Surgeon: Gemma Payor, MD;  Location: [facility];  Service: Ophthalmology;  Laterality: Right;  CDE 12.38   COLONOSCOPY N/A 04/01/2013   pancolonic diverticulosis, redundant colon, large internal hemorrhoids.    COLONOSCOPY  2014   INCOMPLETE PREP IN R COLON   COLONOSCOPY N/A 04/01/2020   Procedure: COLONOSCOPY;  Surgeon: West Bali, MD;  [facility];  Service: Endoscopy;  Laterality: N/A;  8:45am   ESOPHAGOGASTRODUODENOSCOPY N/A 02/18/2016   stricture at GE junction, moderate erosive gastritis and mild non-erosive duodenitis. +H.pylori. Treated initially with Prevpac. Breath test to check for eradication was positive, and he was prescribed Pylera.    ESOPHAGOGASTRODUODENOSCOPY (EGD) WITH PROPOFOL N/A 05/28/2023   Procedure: ESOPHAGOGASTRODUODENOSCOPY (EGD) WITH PROPOFOL;  Surgeon: Lanelle Bal, DO;  [facility];   Service: Endoscopy;  Laterality: N/A;  11:15am, asa 3   ESOPHAGOGASTRODUODENOSCOPY (EGD) WITH PROPOFOL N/A 10/02/2023   Procedure: ESOPHAGOGASTRODUODENOSCOPY (EGD) WITH PROPOFOL;  Surgeon: Dolores Frame, MD;  [facility];  Service: Gastroenterology;  Laterality: N/A;   ESOPHAGOGASTRODUODENOSCOPY (EGD) WITH PROPOFOL N/A 10/24/2023   Procedure: ESOPHAGOGASTRODUODENOSCOPY (EGD) WITH PROPOFOL;  Surgeon: Corbin Ade, MD;  [facility];  Service: Endoscopy;  Laterality: N/A;   EYE SURGERY Left 11/2013   cataract extraction   GASTROJEJUNOSTOMY N/A 10/26/2023   Procedure: GASTROJEJUNOSTOMY;  Surgeon: Lewie Chamber, DO;  Location: [facility];  Service: General;  Laterality: N/A;   POLYPECTOMY  04/01/2020   Procedure: POLYPECTOMY;  Surgeon: West Bali, MD;  [facility];  Service: Endoscopy;;   SAVORY DILATION N/A 02/18/2016   Procedure: SAVORY DILATION;  Surgeon: West Bali, MD;  [facility];  Service: Endoscopy;  Laterality: N/A;   SHOULDER SURGERY Right     Social History:  reports that he has been smoking cigarettes. He has a 48 pack-year smoking history. He has never used smokeless tobacco. He reports current alcohol use. He reports that he does not use drugs.  Allergies:  Allergies  Allergen Reactions   Asa [Aspirin] Other (See Comments)    Stomach pain   Crestor [Rosuvastatin] Other (See Comments)    Stomach pain   Morphine Nausea And Vomiting   Wellbutrin [Bupropion] Other (See Comments)    Stomach pain   Zoloft [Sertraline Hcl] Other (See Comments)    Stomach pain    Medications Prior to Admission  Medication Sig Dispense Refill   acetaminophen (TYLENOL) 500 MG tablet Take 1,000 mg by mouth 2 (two) times daily as needed for headache, moderate pain or fever.     albuterol (VENTOLIN HFA) 108 (90 Base) MCG/ACT inhaler INHALE 2 PUFFS INTO THE LUNGS EVERY 6 HOURS AS NEEDED FOR WHEEZING OR SHORTNESS OF BREATH. 8.5  g 0   busPIRone (BUSPAR) 7.5 MG tablet Take 7.5 mg by mouth 3 (three) times daily.     FARXIGA 5 MG TABS tablet Take 5 mg by mouth daily.     fenofibrate (TRICOR) 145 MG tablet Take 1 tablet (145 mg total) by mouth daily. 30 tablet 3   ipratropium-albuterol (DUONEB) 0.5-2.5 (3) MG/3ML SOLN INHALE 1 VIAL VIA NEBULIZER EVERY 4 TO 6 HOURS. (Patient taking differently: Take 3 mLs by nebulization 2 (two) times daily.) 540 mL 3   linaclotide (LINZESS) 145 MCG CAPS capsule Take 1 capsule (145 mcg total) by mouth daily before breakfast. 30 capsule 3   montelukast (SINGULAIR) 10 MG tablet TAKE (1) TABLET BY MOUTH AT BEDTIME. 30 tablet 0   Multiple Vitamins-Minerals (MULTIVITAMIN WITH MINERALS) tablet Take 1 tablet by mouth daily.     ondansetron (ZOFRAN-ODT) 4 MG disintegrating tablet DISSOLVE 1 TABLET ON TONGUE ONCE DAILY AS NEEDED. (Patient taking differently: Take 4 mg by mouth every 8 (eight) hours as needed for nausea or vomiting.) 20 tablet 0   pantoprazole (PROTONIX) 40 MG tablet Take 1 tablet (40 mg total)  by mouth 2 (two) times daily. 60 tablet 1   risperidone (RISPERDAL) 4 MG tablet TAKE ONE TABLET BY MOUTH IN THE EVENING. TAKE WITH 1MG  TABLET. (Patient taking differently: Take 4 mg by mouth at bedtime.) 90 tablet 0   sucralfate (CARAFATE) 1 GM/10ML suspension Take 10 mLs (1 g total) by mouth 4 (four) times daily -  with meals and at bedtime. 420 mL 1   SYMBICORT 160-4.5 MCG/ACT inhaler INHALE 2 PUFFS INTO THE LUNGS ONCE IN THE MORNING AND AT BEDTIME. 10.2 g 0   tamsulosin (FLOMAX) 0.4 MG CAPS capsule Take 1 capsule (0.4 mg total) by mouth daily after supper. 90 capsule 3   traZODone (DESYREL) 50 MG tablet TAKE ONE TABLET BY MOUTH AT BEDTIME AS NEEDED FOR SLEEP. 90 tablet 0   ACCU-CHEK GUIDE test strip USE TO CHECK BLOOD SUGAR ONCE DAILY 100 strip 0   Accu-Chek Softclix Lancets lancets TEST BLOOD SUGAR ONCE DAILY AS DIRECTED. 100 each 5   risperiDONE (RISPERDAL) 1 MG tablet Take 1 tablet (1 mg  total) by mouth at bedtime. 90 tablet 1   UNABLE TO FIND Med Name: walking cane 1 each 0    Prior to Admission medications   Medication Sig Start Date End Date Taking? Authorizing Provider  acetaminophen (TYLENOL) 500 MG tablet Take 1,000 mg by mouth 2 (two) times daily as needed for headache, moderate pain or fever.   Yes [provider]  albuterol (VENTOLIN HFA) 108 (90 Base) MCG/ACT inhaler INHALE 2 PUFFS INTO THE LUNGS EVERY 6 HOURS AS NEEDED FOR WHEEZING OR SHORTNESS OF BREATH. 10/17/23  Yes Kerri Perches, MD  busPIRone (BUSPAR) 7.5 MG tablet Take 7.5 mg by mouth 3 (three) times daily.   Yes [provider]  FARXIGA 5 MG TABS tablet Take 5 mg by mouth daily. 07/27/22  Yes [provider]  fenofibrate (TRICOR) 145 MG tablet Take 1 tablet (145 mg total) by mouth daily. 09/17/23  Yes Kerri Perches, MD  ipratropium-albuterol (DUONEB) 0.5-2.5 (3) MG/3ML SOLN INHALE 1 VIAL VIA NEBULIZER EVERY 4 TO 6 HOURS. Patient taking differently: Take 3 mLs by nebulization 2 (two) times daily. 06/27/23  Yes Kerri Perches, MD  linaclotide [facility]) 145 MCG CAPS capsule Take 1 capsule (145 mcg total) by mouth daily before breakfast. 08/08/23  Yes Mahon, Courtney L, NP  montelukast (SINGULAIR) 10 MG tablet TAKE (1) TABLET BY MOUTH AT BEDTIME. 08/21/23  Yes Kerri Perches, MD  Multiple Vitamins-Minerals (MULTIVITAMIN WITH MINERALS) tablet Take 1 tablet by mouth daily.   Yes [provider]  ondansetron (ZOFRAN-ODT) 4 MG disintegrating tablet DISSOLVE 1 TABLET ON TONGUE ONCE DAILY AS NEEDED. Patient taking differently: Take 4 mg by mouth every 8 (eight) hours as needed for nausea or vomiting. 09/13/23  Yes Mahon, Courtney L, NP  pantoprazole (PROTONIX) 40 MG tablet Take 1 tablet (40 mg total) by mouth 2 (two) times daily. 10/03/23 10/02/24 Yes Shah, Pratik D, DO  risperidone (RISPERDAL) 4 MG tablet TAKE ONE TABLET BY MOUTH IN THE EVENING. TAKE WITH 1MG   TABLET. Patient taking differently: Take 4 mg by mouth at bedtime. 07/20/23  Yes Kerri Perches, MD  sucralfate (CARAFATE) 1 GM/10ML suspension Take 10 mLs (1 g total) by mouth 4 (four) times daily -  with meals and at bedtime. 10/16/23  Yes Mahon, Frederik Schmidt, NP  SYMBICORT 160-4.5 MCG/ACT inhaler INHALE 2 PUFFS INTO THE LUNGS ONCE IN THE MORNING AND AT BEDTIME. 10/17/23  Yes Kerri Perches, MD  tamsulosin (FLOMAX) 0.4 MG CAPS capsule Take 1 capsule (0.4 mg total) by mouth daily after supper. 09/12/23  Yes McKenzie, Mardene Celeste, MD  traZODone (DESYREL) 50 MG tablet TAKE ONE TABLET BY MOUTH AT BEDTIME AS NEEDED FOR SLEEP. 10/17/23  Yes Kerri Perches, MD  ACCU-CHEK GUIDE test strip USE TO CHECK BLOOD SUGAR ONCE DAILY 07/31/23   Kerri Perches, MD  Accu-Chek Softclix Lancets lancets TEST BLOOD SUGAR ONCE DAILY AS DIRECTED. 10/19/23   Kerri Perches, MD  risperiDONE (RISPERDAL) 1 MG tablet Take 1 tablet (1 mg total) by mouth at bedtime. 09/26/23   Kerri Perches, MD  sulfamethoxazole-trimethoprim (BACTRIM DS) 800-160 MG tablet Take 1 tablet by mouth daily. Take the morning of procedure on 11/14/23 10/30/23   Malen Gauze, MD  UNABLE TO FIND Med Name: walking cane 10/12/23   Kerri Perches, MD  sildenafil (VIAGRA) 100 MG tablet Take 100 mg by mouth. Take one tablet 30 mins before intercourse   03/11/12  [provider]    Blood pressure (!) 78/58, pulse 96, temperature (!) 97.3 F (36.3 C), temperature source Axillary, resp. rate (!) 31, height 6' (1.829 m), weight 82.2 kg, SpO2 95%. Physical Exam: General: ill appearing male on the vent HEENT: NG present with light bilious effluent. ETT in place  Heart: tachycardic  Lungs: mechanically ventilated Abd: soft, mild distension, no rigidity, bowel sounds present, midline incision cdi with staples present and no erythema or drainage Neuro: not f/c  Results for orders placed or performed during the hospital  encounter of 10/23/23 (from the past 48 hour(s))  Glucose, capillary     Status: Abnormal   Collection Time: 10/30/23  4:32 PM  Result Value Ref Range   Glucose-Capillary 260 (H) 70 - 99 mg/dL    Comment: Glucose reference range applies only to samples taken after fasting for at least 8 hours.  Glucose, capillary     Status: Abnormal   Collection Time: 10/30/23  8:08 PM  Result Value Ref Range   Glucose-Capillary 283 (H) 70 - 99 mg/dL    Comment: Glucose reference range applies only to samples taken after fasting for at least 8 hours.  Glucose, capillary     Status: Abnormal   Collection Time: 10/30/23 11:49 PM  Result Value Ref Range   Glucose-Capillary 318 (H) 70 - 99 mg/dL    Comment: Glucose reference range applies only to samples taken after fasting for at least 8 hours.  Glucose, capillary     Status: Abnormal   Collection Time: 10/31/23  3:42 AM  Result Value Ref Range   Glucose-Capillary 289 (H) 70 - 99 mg/dL    Comment: Glucose reference range applies only to samples taken after fasting for at least 8 hours.  CBC     Status: Abnormal   Collection Time: 10/31/23  4:35 AM  Result Value Ref Range   WBC 13.2 (H) 4.0 - 10.5 K/uL   RBC 4.24 4.22 - 5.81 MIL/uL   Hemoglobin 11.5 (L) 13.0 - 17.0 g/dL   HCT 29.5 (L) 62.1 - 30.8 %   MCV 87.5 80.0 - 100.0 fL   MCH 27.1 26.0 - 34.0 pg   MCHC 31.0 30.0 - 36.0 g/dL   RDW 65.7 (H) 84.6 - 96.2 %   Platelets 279 150 - 400 K/uL   nRBC 0.2 0.0 - 0.2 %    Comment: Performed at [facility], [address], [place], [place] [address]  Heparin level (unfractionated)  Status: Abnormal   Collection Time: 10/31/23  4:35 AM  Result Value Ref Range   Heparin Unfractionated 0.71 (H) 0.30 - 0.70 IU/mL    Comment: (NOTE) The clinical reportable range upper limit is being lowered to >1.10 to align with the FDA approved guidance for the current laboratory assay.  If heparin results are below expected values, and patient dosage has  been  confirmed, suggest follow up testing of antithrombin III levels. Performed at [facility], [address] [place], [place] [address]   Comprehensive metabolic panel     Status: Abnormal   Collection Time: 10/31/23  4:35 AM  Result Value Ref Range   Sodium 130 (L) 135 - 145 mmol/L   Potassium 4.0 3.5 - 5.1 mmol/L   Chloride 108 98 - 111 mmol/L   CO2 15 (L) 22 - 32 mmol/L   Glucose, Bld 285 (H) 70 - 99 mg/dL    Comment: Glucose reference range applies only to samples taken after fasting for at least 8 hours.   BUN 72 (H) 8 - 23 mg/dL   Creatinine, Ser 5.78 (H) 0.61 - 1.24 mg/dL   Calcium 46.9 (H) 8.9 - 10.3 mg/dL   Total Protein 7.3 6.5 - 8.1 g/dL   Albumin 3.2 (L) 3.5 - 5.0 g/dL   AST 32 15 - 41 U/L   ALT 26 0 - 44 U/L   Alkaline Phosphatase 40 38 - 126 U/L   Total Bilirubin 0.5 <1.2 mg/dL   GFR, Estimated 55 (L) >60 mL/min    Comment: (NOTE) Calculated using the CKD-EPI Creatinine Equation (2021)    Anion gap 7 5 - 15    Comment: Performed at [facility], [address] [place], [place] [address]  Magnesium     Status: None   Collection Time: 10/31/23  4:35 AM  Result Value Ref Range   Magnesium 2.3 1.7 - 2.4 mg/dL    Comment: Performed at [facility], [address], [place], [place] [address]  Phosphorus     Status: None   Collection Time: 10/31/23  4:35 AM  Result Value Ref Range   Phosphorus 3.4 2.5 - 4.6 mg/dL    Comment: Performed at [facility], [address] [place], [place] [address]  Glucose, capillary     Status: Abnormal   Collection Time: 10/31/23  8:06 AM  Result Value Ref Range   Glucose-Capillary 255 (H) 70 - 99 mg/dL    Comment: Glucose reference range applies only to samples taken after fasting for at least 8 hours.  Glucose, capillary     Status: Abnormal   Collection Time: 10/31/23 10:59 AM  Result Value Ref Range   Glucose-Capillary 237 (H) 70 - 99 mg/dL    Comment: Glucose reference range applies only to samples taken after fasting for at least  8 hours.  Glucose, capillary     Status: Abnormal   Collection Time: 10/31/23  4:07 PM  Result Value Ref Range   Glucose-Capillary 257 (H) 70 - 99 mg/dL    Comment: Glucose reference range applies only to samples taken after fasting for at least 8 hours.  Vitamin B12     Status: Abnormal   Collection Time: 10/31/23  5:15 PM  Result Value Ref Range   Vitamin B-12 1,334 (H) 180 - 914 pg/mL    Comment: (NOTE) This assay is not validated for testing neonatal or myeloproliferative syndrome specimens for Vitamin B12 levels. Performed at [facility], [address] [place], [place] [address]  Folate     Status: None   Collection Time: 10/31/23  5:15 PM  Result Value Ref Range   Folate >40.0 >5.9 ng/mL    Comment: RESULTS CONFIRMED BY MANUAL DILUTION Performed at [facility], [address], [place], [place] [address]   TSH     Status: None   Collection Time: 10/31/23  5:15 PM  Result Value Ref Range   TSH 1.765 0.350 - 4.500 uIU/mL    Comment: Performed by a 3rd Generation assay with a functional sensitivity of <=0.01 uIU/mL. Performed at [facility], [address], [place], [place] [address]   T4, free     Status: Abnormal   Collection Time: 10/31/23  5:15 PM  Result Value Ref Range   Free T4 1.16 (H) 0.61 - 1.12 ng/dL    Comment: (NOTE) Biotin ingestion may interfere with free T4 tests. If the results are inconsistent with the TSH level, previous test results, or the clinical presentation, then consider biotin interference. If needed, order repeat testing after stopping biotin. Performed at [facility], [address] [place], [place] [address]   Ammonia     Status: None   Collection Time: 10/31/23  5:15 PM  Result Value Ref Range   Ammonia 32 9 - 35 umol/L    Comment: Performed at [facility], [address] [place], [place] [address]  Brain natriuretic peptide     Status: None   Collection Time: 10/31/23  5:15 PM  Result Value Ref Range   B Natriuretic  Peptide 17.0 0.0 - 100.0 pg/mL    Comment: Performed at [facility], [address] [place], [place] [address]  Procalcitonin     Status: None   Collection Time: 10/31/23  5:15 PM  Result Value Ref Range   Procalcitonin 0.24 ng/mL    Comment:        Interpretation: PCT (Procalcitonin) <= 0.5 ng/mL: Systemic infection (sepsis) is not likely. Local bacterial infection is possible. (NOTE)       Sepsis PCT Algorithm           Lower Respiratory Tract                                      Infection PCT Algorithm    ----------------------------     ----------------------------         PCT < 0.25 ng/mL                PCT < 0.10 ng/mL          Strongly encourage             Strongly discourage   discontinuation of antibiotics    initiation of antibiotics    ----------------------------     -----------------------------       PCT 0.25 - 0.50 ng/mL            PCT 0.10 - 0.25 ng/mL               OR       >80% decrease in PCT            Discourage initiation of                                            antibiotics  Encourage discontinuation           of antibiotics    ----------------------------     -----------------------------         PCT >= 0.50 ng/mL              PCT 0.26 - 0.50 ng/mL               AND        <80% decrease in PCT             Encourage initiation of                                             antibiotics       Encourage continuation           of antibiotics    ----------------------------     -----------------------------        PCT >= 0.50 ng/mL                  PCT > 0.50 ng/mL               AND         increase in PCT                  Strongly encourage                                      initiation of antibiotics    Strongly encourage escalation           of antibiotics                                     -----------------------------                                           PCT <= 0.25 ng/mL                                                 OR                                         > 80% decrease in PCT                                      Discontinue / Do not initiate                                             antibiotics  Performed at [facility], [address], [place], [place] [address]   Blood gas, arterial     Status: Abnormal   Collection Time: 10/31/23  5:22 PM  Result  Value Ref Range   pH, Arterial 7.41 7.35 - 7.45   pCO2 arterial 22 (L) 32 - 48 mmHg   pO2, Arterial 62 (L) 83 - 108 mmHg   Bicarbonate 13.9 (L) 20.0 - 28.0 mmol/L   Acid-base deficit 8.6 (H) 0.0 - 2.0 mmol/L   O2 Saturation 92 %   Patient temperature 37.1    Collection site LEFT RADIAL    Drawn by [id number]    Allens test (pass/fail) PASS PASS    Comment: Performed at [facility], [address], [place], [place] [address]  Basic metabolic panel     Status: Abnormal   Collection Time: 10/31/23  6:14 PM  Result Value Ref Range   Sodium 130 (L) 135 - 145 mmol/L   Potassium 4.4 3.5 - 5.1 mmol/L   Chloride 104 98 - 111 mmol/L   CO2 13 (L) 22 - 32 mmol/L   Glucose, Bld 288 (H) 70 - 99 mg/dL    Comment: Glucose reference range applies only to samples taken after fasting for at least 8 hours.   BUN 84 (H) 8 - 23 mg/dL   Creatinine, Ser 0.98 (H) 0.61 - 1.24 mg/dL   Calcium 11.9 (H) 8.9 - 10.3 mg/dL   GFR, Estimated 52 (L) >60 mL/min    Comment: (NOTE) Calculated using the CKD-EPI Creatinine Equation (2021)    Anion gap 13 5 - 15    Comment: Performed at [facility], [address] [place], [place] [address]  Lactic acid, plasma     Status: None   Collection Time: 10/31/23  6:14 PM  Result Value Ref Range   Lactic Acid, Venous 1.7 0.5 - 1.9 mmol/L    Comment: Performed at [facility], [address], [place], [place] [address]  CBC     Status: Abnormal   Collection Time: 10/31/23  6:14 PM  Result Value Ref Range   WBC 15.8 (H) 4.0 - 10.5 K/uL   RBC 4.35 4.22 - 5.81 MIL/uL   Hemoglobin 12.2 (L) 13.0 - 17.0 g/dL   HCT 30.8 (L) 65.7 - 84.6 %   MCV 87.8 80.0  - 100.0 fL   MCH 28.0 26.0 - 34.0 pg   MCHC 31.9 30.0 - 36.0 g/dL   RDW 96.2 (H) 95.2 - 84.1 %   Platelets 203 150 - 400 K/uL   nRBC 0.3 (H) 0.0 - 0.2 %    Comment: Performed at [facility], [address] [place], [place] [address]  Culture, blood (Routine X 2) w Reflex to ID Panel     Status: None (Preliminary result)   Collection Time: 10/31/23  6:14 PM   Specimen: BLOOD  Result Value Ref Range   Specimen Description      BLOOD BLOOD LEFT HAND Performed at [facility], [address] [place], [place] [address]    Special Requests      BOTTLES DRAWN AEROBIC AND ANAEROBIC Blood Culture adequate volume Performed at [facility], [address] [place], [place] [address]    Culture      NO GROWTH < 24 HOURS Performed at [facility], [address], [place], [place] [address]    Report Status PENDING   Urinalysis, w/ Reflex to Culture (Infection Suspected) -Urine, Clean Catch     Status: Abnormal   Collection Time: 10/31/23  7:07 PM  Result Value Ref Range   Specimen Source URINE, CLEAN CATCH    Color, Urine YELLOW YELLOW   APPearance CLEAR CLEAR  Specific Gravity, Urine 1.024 1.005 - 1.030   pH 5.0 5.0 - 8.0   Glucose, UA 150 (A) NEGATIVE mg/dL   Hgb urine dipstick SMALL (A) NEGATIVE   Bilirubin Urine NEGATIVE NEGATIVE   Ketones, ur NEGATIVE NEGATIVE mg/dL   Protein, ur NEGATIVE NEGATIVE mg/dL   Nitrite NEGATIVE NEGATIVE   Leukocytes,Ua NEGATIVE NEGATIVE   RBC / HPF 0-5 0 - 5 RBC/hpf   WBC, UA 0-5 0 - 5 WBC/hpf    Comment:        Reflex urine culture not performed if WBC <=10, OR if Squamous epithelial cells >5. If Squamous epithelial cells >5 suggest recollection.    Bacteria, UA RARE (A) NONE SEEN   Squamous Epithelial / HPF 0-5 0 - 5 /HPF   Mucus PRESENT     Comment: Performed at [facility], [address], [place], [place] [address]  MRSA Next Gen by PCR, Nasal     Status: None   Collection Time: 10/31/23  7:07 PM   Specimen: Nasal Mucosa; Nasal Swab   Result Value Ref Range   MRSA by PCR Next Gen NOT DETECTED NOT DETECTED    Comment: (NOTE) The GeneXpert MRSA Assay (FDA approved for NASAL specimens only), is one component of a comprehensive MRSA colonization surveillance program. It is not intended to diagnose MRSA infection nor to guide or monitor treatment for MRSA infections. Test performance is not FDA approved in patients less than 15 years old. Performed at [facility], [address], [place], [place] [address]   Culture, blood (Routine X 2) w Reflex to ID Panel     Status: None (Preliminary result)   Collection Time: 10/31/23  7:09 PM   Specimen: BLOOD  Result Value Ref Range   Specimen Description      BLOOD BLOOD LEFT ARM Performed at [facility], [address] [place], [place] [address]    Special Requests      BOTTLES DRAWN AEROBIC AND ANAEROBIC Blood Culture results may not be optimal due to an inadequate volume of blood received in culture bottles Performed at [facility], [address], [place], [place] [address]    Culture      NO GROWTH < 24 HOURS Performed at [facility], [address], [place], [place] [address]    Report Status PENDING   Glucose, capillary     Status: Abnormal   Collection Time: 10/31/23  7:19 PM  Result Value Ref Range   Glucose-Capillary 238 (H) 70 - 99 mg/dL    Comment: Glucose reference range applies only to samples taken after fasting for at least 8 hours.  Glucose, capillary     Status: Abnormal   Collection Time: 10/31/23  8:33 PM  Result Value Ref Range   Glucose-Capillary 224 (H) 70 - 99 mg/dL    Comment: Glucose reference range applies only to samples taken after fasting for at least 8 hours.  Glucose, capillary     Status: Abnormal   Collection Time: 10/31/23  9:59 PM  Result Value Ref Range   Glucose-Capillary 215 (H) 70 - 99 mg/dL    Comment: Glucose reference range applies only to samples taken after fasting for at least 8 hours.  Glucose, capillary      Status: Abnormal   Collection Time: 10/31/23 11:58 PM  Result Value Ref Range   Glucose-Capillary 216 (H) 70 - 99 mg/dL    Comment: Glucose reference range applies only to samples taken after fasting for at least 8  hours.  Basic metabolic panel     Status: Abnormal   Collection Time: 11/01/23 12:29 AM  Result Value Ref Range   Sodium 133 (L) 135 - 145 mmol/L   Potassium 3.9 3.5 - 5.1 mmol/L   Chloride 104 98 - 111 mmol/L   CO2 18 (L) 22 - 32 mmol/L   Glucose, Bld 228 (H) 70 - 99 mg/dL    Comment: Glucose reference range applies only to samples taken after fasting for at least 8 hours.   BUN 81 (H) 8 - 23 mg/dL   Creatinine, Ser 9.14 (H) 0.61 - 1.24 mg/dL   Calcium 78.2 (H) 8.9 - 10.3 mg/dL   GFR, Estimated 47 (L) >60 mL/min    Comment: (NOTE) Calculated using the CKD-EPI Creatinine Equation (2021)    Anion gap 11 5 - 15    Comment: Performed at [facility], [address], [place], [place] [address]  Heparin level (unfractionated)     Status: None   Collection Time: 11/01/23  4:25 AM  Result Value Ref Range   Heparin Unfractionated 0.57 0.30 - 0.70 IU/mL    Comment: (NOTE) The clinical reportable range upper limit is being lowered to >1.10 to align with the FDA approved guidance for the current laboratory assay.  If heparin results are below expected values, and patient dosage has  been confirmed, suggest follow up testing of antithrombin III levels. Performed at [facility], [address], [place], [place] [address]   Comprehensive metabolic panel     Status: Abnormal   Collection Time: 11/01/23  4:25 AM  Result Value Ref Range   Sodium 135 135 - 145 mmol/L   Potassium 3.8 3.5 - 5.1 mmol/L   Chloride 104 98 - 111 mmol/L   CO2 17 (L) 22 - 32 mmol/L   Glucose, Bld 155 (H) 70 - 99 mg/dL    Comment: Glucose reference range applies only to samples taken after fasting for at least 8 hours.   BUN 81 (H) 8 - 23 mg/dL   Creatinine, Ser 7.84 (H) 0.61 - 1.24 mg/dL   Calcium 69.6  (H) 8.9 - 10.3 mg/dL   Total Protein 7.5 6.5 - 8.1 g/dL   Albumin 3.2 (L) 3.5 - 5.0 g/dL   AST 60 (H) 15 - 41 U/L   ALT 65 (H) 0 - 44 U/L   Alkaline Phosphatase 48 38 - 126 U/L   Total Bilirubin 0.9 <1.2 mg/dL   GFR, Estimated 48 (L) >60 mL/min    Comment: (NOTE) Calculated using the CKD-EPI Creatinine Equation (2021)    Anion gap 14 5 - 15    Comment: Performed at [facility], [address], [place], [place] [address]  Magnesium     Status: None   Collection Time: 11/01/23  4:25 AM  Result Value Ref Range   Magnesium 2.2 1.7 - 2.4 mg/dL    Comment: Performed at [facility], [address], [place], [place] [address]  Phosphorus     Status: None   Collection Time: 11/01/23  4:25 AM  Result Value Ref Range   Phosphorus 4.1 2.5 - 4.6 mg/dL    Comment: Performed at [facility], [address], [place], [place] [address]  CBC with Differential/Platelet     Status: Abnormal   Collection Time: 11/01/23  4:25 AM  Result Value Ref Range   WBC 14.5 (H) 4.0 - 10.5 K/uL   RBC 4.45 4.22 - 5.81 MIL/uL   Hemoglobin 11.9 (L) 13.0 - 17.0 g/dL  HCT 38.7 (L) 39.0 - 52.0 %   MCV 87.0 80.0 - 100.0 fL   MCH 26.7 26.0 - 34.0 pg   MCHC 30.7 30.0 - 36.0 g/dL   RDW 32.9 (H) 51.8 - 84.1 %   Platelets 178 150 - 400 K/uL   nRBC 0.2 0.0 - 0.2 %   Neutrophils Relative % 82 %   Neutro Abs 11.9 (H) 1.7 - 7.7 K/uL   Lymphocytes Relative 8 %   Lymphs Abs 1.2 0.7 - 4.0 K/uL   Monocytes Relative 9 %   Monocytes Absolute 1.3 (H) 0.1 - 1.0 K/uL   Eosinophils Relative 1 %   Eosinophils Absolute 0.1 0.0 - 0.5 K/uL   Basophils Relative 0 %   Basophils Absolute 0.0 0.0 - 0.1 K/uL   WBC Morphology See Note     Comment: ATYPICAL LYMPHOCYTES PRESENT   RBC Morphology See Note     Comment: POLYCHROMATOPHILIC RED CELLS PRESENT   Smear Review See Note     Comment: LARGE PLATELETS PRESENT   Abs Immature Granulocytes 0.00 0.00 - 0.07 K/uL   Polychromasia PRESENT     Comment: Performed at [facility],  [address], [place], [place] [address]  Lipid panel     Status: Abnormal   Collection Time: 11/01/23  4:25 AM  Result Value Ref Range   Cholesterol 95 0 - 200 mg/dL   Triglycerides 016 (H) <150 mg/dL   HDL 30 (L) >01 mg/dL   Total CHOL/HDL Ratio 3.2 RATIO   VLDL 30 0 - 40 mg/dL   LDL Cholesterol 35 0 - 99 mg/dL    Comment:        Total Cholesterol/HDL:CHD Risk Coronary Heart Disease Risk Table                     Men   Women  1/2 Average Risk   3.4   3.3  Average Risk       5.0   4.4  2 X Average Risk   9.6   7.1  3 X Average Risk  23.4   11.0        Use the calculated Patient Ratio above and the CHD Risk Table to determine the patient's CHD Risk.        ATP III CLASSIFICATION (LDL):  <100     mg/dL   Optimal  093-235  mg/dL   Near or Above                    Optimal  130-159  mg/dL   Borderline  573-220  mg/dL   High  >254     mg/dL   Very High Performed at [facility], [address], [place], [place] [address]   Hemoglobin A1c     Status: Abnormal   Collection Time: 11/01/23  4:25 AM  Result Value Ref Range   Hgb A1c MFr Bld 6.0 (H) 4.8 - 5.6 %    Comment: (NOTE) Pre diabetes:          5.7%-6.4%  Diabetes:              >6.4%  Glycemic control for   <7.0% adults with diabetes    Mean Plasma Glucose 125.5 mg/dL    Comment: Performed at [facility], [address] [place], [place] [address]  Glucose, capillary     Status: Abnormal   Collection Time: 11/01/23  4:35 AM  Result Value Ref Range  Glucose-Capillary 147 (H) 70 - 99 mg/dL    Comment: Glucose reference range applies only to samples taken after fasting for at least 8 hours.  Glucose, capillary     Status: Abnormal   Collection Time: 11/01/23  7:32 AM  Result Value Ref Range   Glucose-Capillary 165 (H) 70 - 99 mg/dL    Comment: Glucose reference range applies only to samples taken after fasting for at least 8 hours.  Blood gas, arterial     Status: Abnormal   Collection Time: 11/01/23  7:42 AM   Result Value Ref Range   pH, Arterial 7.37 7.35 - 7.45   pCO2 arterial 27 (L) 32 - 48 mmHg   pO2, Arterial 74 (L) 83 - 108 mmHg   Bicarbonate 15.8 (L) 20.0 - 28.0 mmol/L   Acid-base deficit 8.2 (H) 0.0 - 2.0 mmol/L   O2 Saturation 96.3 %   Patient temperature 36.4    Collection site LEFT RADIAL    Drawn by [id number]    Allens test (pass/fail) PASS PASS    Comment: Performed at [facility], [address], [place], [place] [address]  Procalcitonin     Status: None   Collection Time: 11/01/23  8:01 AM  Result Value Ref Range   Procalcitonin 1.42 ng/mL    Comment:        Interpretation: PCT > 0.5 ng/mL and <= 2 ng/mL: Systemic infection (sepsis) is possible, but other conditions are known to elevate PCT as well. (NOTE)       Sepsis PCT Algorithm           Lower Respiratory Tract                                      Infection PCT Algorithm    ----------------------------     ----------------------------         PCT < 0.25 ng/mL                PCT < 0.10 ng/mL          Strongly encourage             Strongly discourage   discontinuation of antibiotics    initiation of antibiotics    ----------------------------     -----------------------------       PCT 0.25 - 0.50 ng/mL            PCT 0.10 - 0.25 ng/mL               OR       >80% decrease in PCT            Discourage initiation of                                            antibiotics      Encourage discontinuation           of antibiotics    ----------------------------     -----------------------------         PCT >= 0.50 ng/mL              PCT 0.26 - 0.50 ng/mL                AND       <80% decrease in PCT  Encourage initiation of                                             antibiotics       Encourage continuation           of antibiotics    ----------------------------     -----------------------------        PCT >= 0.50 ng/mL                  PCT > 0.50 ng/mL               AND         increase in PCT                   Strongly encourage                                      initiation of antibiotics    Strongly encourage escalation           of antibiotics                                     -----------------------------                                           PCT <= 0.25 ng/mL                                                 OR                                        > 80% decrease in PCT                                      Discontinue / Do not initiate                                             antibiotics  Performed at [facility], [address] [place], [place] [address]   Brain natriuretic peptide     Status: None   Collection Time: 11/01/23  8:01 AM  Result Value Ref Range   B Natriuretic Peptide 26.0 0.0 - 100.0 pg/mL    Comment: Performed at [facility], [address], [place], [place] [address]  VITAMIN D 25 Hydroxy (Vit-D Deficiency, Fractures)     Status: None   Collection Time: 11/01/23  8:01 AM  Result Value Ref Range   Vit D, 25-Hydroxy 63.86 30 - 100 ng/mL    Comment: (NOTE) Vitamin D deficiency has been defined by the Institute of Medicine  and an Endocrine Society practice guideline as a level  of serum 25-OH  vitamin D less than 20 ng/mL (1,2). The Endocrine Society went on to  further define vitamin D insufficiency as a level between 21 and 29  ng/mL (2).  1. IOM (Institute of Medicine). 2010. Dietary reference intakes for  calcium and D. [place]: [employer]. 2. Holick MF, Binkley [place], Bischoff-Ferrari HA, et al. Evaluation,  treatment, and prevention of vitamin D deficiency: an Endocrine  Society clinical practice guideline, JCEM. 2011 Jul; 96(7): 1911-30.  Performed at [facility], [address], [place], [place] [address]   Cortisol-am, blood     Status: Abnormal   Collection Time: 11/01/23  8:01 AM  Result Value Ref Range   Cortisol - AM >100.0 (H) 6.7 - 22.6 ug/dL    Comment: RESULT CONFIRMED BY MANUAL DILUTION Performed  at [facility], [address], [place], [place] [address]   Lactic acid, plasma     Status: Abnormal   Collection Time: 11/01/23  9:00 AM  Result Value Ref Range   Lactic Acid, Venous 2.5 (HH) 0.5 - 1.9 mmol/L    Comment: CRITICAL RESULT CALLED TO, READ BACK BY AND VERIFIED WITH ROJERO,E AT 9:50AM ON 11/01/23 BY [facility] Performed at [facility], [address] [place], [place] [address]   Lactic acid, plasma     Status: Abnormal   Collection Time: 11/01/23 11:31 AM  Result Value Ref Range   Lactic Acid, Venous 2.0 (HH) 0.5 - 1.9 mmol/L    Comment: CRITICAL VALUE NOTED. VALUE IS CONSISTENT WITH PREVIOUSLY REPORTED/CALLED VALUE Performed at [facility], [address] [place], [place] [address]   Glucose, capillary     Status: Abnormal   Collection Time: 11/01/23 11:38 AM  Result Value Ref Range   Glucose-Capillary 208 (H) 70 - 99 mg/dL    Comment: Glucose reference range applies only to samples taken after fasting for at least 8 hours.  Glucose, capillary     Status: Abnormal   Collection Time: 11/01/23  1:35 PM  Result Value Ref Range   Glucose-Capillary 207 (H) 70 - 99 mg/dL    Comment: Glucose reference range applies only to samples taken after fasting for at least 8 hours.   *Note: Due to a large number of results and/or encounters for the requested time period, some results have not been displayed. A complete set of results can be found in Results Review.   CT ABDOMEN PELVIS W CONTRAST  Addendum Date: 11/01/2023   ADDENDUM REPORT: 11/01/2023 08:10 ADDENDUM: Received a call concerning gallbladder findings. Impression 4 most likely reflects description of the dilated distal stomach. The gallbladder is nondistended and there are no calcified stones. Electronically Signed   By: Tiburcio Pea M.D.   On: 11/01/2023 08:10   Result Date: 11/01/2023 CLINICAL DATA:  Peritonitis or perforation suspected. EXAM: CT ABDOMEN AND PELVIS WITH CONTRAST TECHNIQUE: Multidetector CT  imaging of the abdomen and pelvis was performed using the standard protocol following bolus administration of intravenous contrast. RADIATION DOSE REDUCTION: This exam was performed according to the departmental dose-optimization program which includes automated exposure control, adjustment of the mA and/or kV according to patient size and/or use of iterative reconstruction technique. CONTRAST:  OMNIPAQUE IOHEXOL 300 MG/ML  SOLN COMPARISON:  CT chest abdomen and pelvis 10/24/2023 FINDINGS: Lower chest: There is atelectasis in the lung bases. Hepatobiliary: No focal liver abnormality is seen. The gallbladder is now dilated and contains multiple gallstones. There is a new small focus of air  in the gallbladder. Findings concerning for complex dilated common bile duct. There is inflammatory stranding surrounding the common bile duct. Pancreas: Unremarkable. No pancreatic ductal dilatation or surrounding inflammatory changes. Spleen: Normal in size without focal abnormality. Adrenals/Urinary Tract: There is a small fat containing right adrenal nodule compatible with myelolipoma measuring 7 mm. The left adrenal gland is within normal limits. There are bilateral renal cysts. The largest is in the superior pole the right kidney measuring 4.8 cm. Mildly hyperdense lesion in the inferior pole of the left kidney measures 2.5 cm and appears unchanged. There is no hydronephrosis or perinephric fat stranding. Tiny bladder calculus is seen lying in the dependent portion of the bladder. Stomach/Bowel: There is wall thickening of the distal esophagus. There is a new gastroduodenal anastomosis near the greater curvature of the stomach. The stomach is markedly dilated with large air-fluid level as seen on prior. Heterogeneous masslike density seen in the region of the first and second portion of the duodenal measuring 5.3 x 4.5 by 5.9 cm with some new central fluid areas of attenuation/low-density. There is surrounding  inflammatory stranding. There is a new small amount of free air throughout the upper abdomen. Remaining small bowel and colon are within normal limits. Oral contrast reaches the rectum. There are few scattered sigmoid colon diverticula. The appendix is not seen. Vascular/Lymphatic: Aortic atherosclerosis. No enlarged abdominal or pelvic lymph nodes. Reproductive: Prostate gland is enlarged. Other: Small fat containing umbilical hernia. There is no ascites. Small amount of free air throughout the upper abdomen. No evidence for abscess. Musculoskeletal: The bones are osteopenic. Degenerative changes affect the spine. IMPRESSION: 1. New small amount of free air throughout the upper abdomen may be related to recent abdominal surgery versus perforated viscus. 2. New gastroduodenal anastomosis. 3. Heterogeneous masslike density in the region of the first and second portion of the duodenum with some new central fluid areas of attenuation. Findings may represent hematoma, mass and/or infection. 4. New dilated gallbladder with multiple gallstones. There is a new small focus of air in the gallbladder. Findings are concerning for cholecystitis with possible emphysematous cholecystitis. Findings may also be related to direct connection of gallbladder with duodenum. Surgical consultation recommended. 5. Complex dilated common bile duct with inflammatory stranding surrounding the common bile duct. Findings may be related to choledocholithiasis. 6. Stable indeterminate 2.5 cm left renal lesion. This can be further evaluated with MRI. 7. Stable marked dilatation of the stomach with large air-fluid level. 8. Stable wall thickening of the distal esophagus. Aortic Atherosclerosis (ICD10-I70.0) and Emphysema (ICD10-J43.9). These results were called by telephone at the time of interpretation on 10/31/2023 at 10:47 pm to provider OLADAPO ADEFESO , who verbally acknowledged these results. Electronically Signed: By: Darliss Cheney M.D. On:  10/31/2023 22:48   DG CHEST PORT 1 VIEW  Result Date: 11/01/2023 CLINICAL DATA:  Recent abdominal surgery. Respiratory distress. [id number]. EXAM: PORTABLE CHEST 1 VIEW COMPARISON:  Portable chest 10/31/2023 FINDINGS: 7:33 a.m. Right PICC terminates in the mid SVC at the azygous confluence. There has been a slight interval pullback. There is mild cardiomegaly but no evidence of CHF. The mediastinal configuration is stable with aortic tortuosity and atherosclerosis. The lungs are clear apart from again noted small left pleural effusion. No new osseous abnormality. Free air underneath the domes of the diaphragm is less conspicuous today. IMPRESSION: 1. Right PICC tip in the SVC at the azygous confluence with a slight interval pullback. 2. Small left pleural effusion. No other acute radiographic chest findings. 3.  Free air underneath the domes of the diaphragm is less conspicuous today. Electronically Signed   By: Almira Bar M.D.   On: 11/01/2023 07:53   CT HEAD WO CONTRAST ( )  Result Date: 10/31/2023 CLINICAL DATA:  Initial evaluation for altered mental status. EXAM: CT HEAD WITHOUT CONTRAST TECHNIQUE: Contiguous axial images were obtained from the base of the skull through the vertex without intravenous contrast. RADIATION DOSE REDUCTION: This exam was performed according to the departmental dose-optimization program which includes automated exposure control, adjustment of the mA and/or kV according to patient size and/or use of iterative reconstruction technique. COMPARISON:  Prior CT from 10/30/2015. FINDINGS: Brain: Age-related cerebral atrophy with moderately advanced chronic microvascular ischemic disease. No acute intracranial hemorrhage. 2.3 cm hypodensity seen involving the superior right insula/right frontal operculum, age indeterminate, but could reflect an evolving acute ischemic infarct (series 2, image 17). No other acute large vessel territory infarct. No mass lesion or midline shift. No  hydrocephalus or extra-axial fluid collection. Vascular: No abnormal hyperdense vessel. Calcified atherosclerosis present at the skull base. Skull: Scalp soft tissues demonstrate no acute finding. Calvarium intact. Sinuses/Orbits: Globes orbital soft tissues within normal limits. Paranasal sinuses and mastoid air cells are clear. Other: None. IMPRESSION: 1. 2.3 cm hypodensity involving the superior right insula/right frontal operculum, age indeterminate, but could reflect an evolving acute ischemic infarct. Correlation with dedicated MRI suggested for further evaluation. 2. No other acute intracranial abnormality. 3. Age-related cerebral atrophy with moderately advanced chronic microvascular ischemic disease. Electronically Signed   By: Rise Mu M.D.   On: 10/31/2023 22:13   DG Chest Port 1 View  Result Date: 10/31/2023 CLINICAL DATA:  Respiratory distress.  Recent abdominal surgery. EXAM: PORTABLE CHEST 1 VIEW COMPARISON:  Abdominal x-ray 10/30/2023. FINDINGS: Right upper extremity PICC terminates over the SVC. The lungs are clear. There is no pleural effusion or pneumothorax. The cardiomediastinal silhouette is within normal limits. There is a small amount of free air in the upper abdomen. IMPRESSION: 1. No active disease in the chest. 2. Small amount of free air in the upper abdomen, likely related to recent abdominal surgery. Please correlate clinically. If there is high clinical concern for acute pathology, recommend CT. Electronically Signed   By: Darliss Cheney M.D.   On: 10/31/2023 19:56    Anti-infectives (From admission, onward)    Start     Dose/Rate Route Frequency Ordered Stop   10/31/23 2200  piperacillin-tazobactam (ZOSYN) IVPB 3.375 g        3.375 g 12.5 mL/hr over 240 Minutes Intravenous Every 8 hours 10/31/23 1753     10/26/23 1400  ceFAZolin (ANCEF) IVPB 2g/100 mL premix        2 g 200 mL/hr over 30 Minutes Intravenous On call to O.R. 10/26/23 1341 10/26/23 1558    10/26/23 1343  ceFAZolin (ANCEF) 2-4 GM/100ML-% IVPB       Note to Pharmacy: Sherren Kerns H: cabinet override      10/26/23 1343 10/26/23 1559        Assessment/Plan POD#6 s/p Gastrojejunostomy 10/26/23 by Dr. Robyne Peers for GOO secondary to PUD stricture/duodenitis and portal vein thrombosis - Patient was transferred to [facility] for ICU CCM management. He is currently intubated, on pressor support, and undergoing stroke work up. We are asked to see for ongoing postoperative care. - CT a/p shows small amount of free air but no free fluid, and heterogeneous masslike density in the region of the first and second portion of the duodenum with some new  central fluid areas of attenuation that may represent hematoma, mass and/or infection. The stomach is markedly dilated.  - Continue NPO/NGT to LIWS for now. Consider restarting TPN.  ID - zosyn VTE - heparin gtt FEN - NPO/NGT to LIWS Foley - none  AMS, ?acute stroke - MRI brain pending Shock VDRF DM HTN HLD CAD COPDIgG MGUS  I reviewed CCM notes, last 24 h vitals and pain scores, last 48 h intake and output, last 24 h labs and trends, and last 24 h imaging results.   Franne Forts, PA-C [facility] 11/01/2023, 2:07 PM Please see Amion for pager number during day hours 7:00am-4:30pm

## 2023-11-01 NOTE — Progress Notes (Signed)
PHARMACY - ANTICOAGULATION Pharmacy Consult for heparin Indication:  VTE treatment   Allergies  Allergen Reactions   Asa [Aspirin] Other (See Comments)    Stomach pain   Crestor [Rosuvastatin] Other (See Comments)    Stomach pain   Morphine Nausea And Vomiting   Wellbutrin [Bupropion] Other (See Comments)    Stomach pain   Zoloft [Sertraline Hcl] Other (See Comments)    Stomach pain    Patient Measurements: Height: 6' (182.9 cm) Weight: 82.2 kg (181 lb 3.5 oz) IBW/kg (Calculated) : 77.6 Heparin Dosing Weight: 88 kg  Vital Signs: Temp: 97.8 F (36.6 C) (11/14 0546) Temp Source: Oral (11/14 0546) BP: 164/99 (11/14 0600) Pulse Rate: 94 (11/14 0600)  Labs: Recent Labs    10/30/23 0440 10/31/23 0435 10/31/23 1814 11/01/23 0029 11/01/23 0425  HGB 11.4* 11.5* 12.2*  --  11.9*  HCT 36.3* 37.1* 38.2*  --  38.7*  PLT 344 279 203  --  178  HEPARINUNFRC 0.52 0.71*  --   --  0.57  CREATININE 1.18 1.36* 1.43* 1.55* 1.52*    Estimated Creatinine Clearance: 47.5 mL/min (A) (by C-G formula based on SCr of 1.52 mg/dL (H)).  Assessment: 72 y.o. male with possible PE for heparin.  Ultrasound negative for portal vein thrombosis. CBC WNL  Heparin level at goal this morning on 1950 units/hr. Overnight events noted, patient with hypoxia and moved to SDU for increased monitoring. Issue with TPN per previous pharmacist and TPN was stopped. Also reported heparin was off most of the night due to above events. Appears to be infusing now. No bleeding issues noted. CBC has remained stable.   Goal of Therapy:  Heparin level 0.3-0.7 units/ml Monitor platelets by anticoagulation protocol: Yes   Plan:  Continue heparin infusion at 1950 units/hr.  Heparin level daily Continue to monitor CBC   Sheppard Coil PharmD., BCPS Clinical Pharmacist 11/01/2023 8:03 AM

## 2023-11-01 NOTE — Progress Notes (Signed)
Pharmacy Antibiotic Note  Matthew Molina is a 72 y.o. male admitted on 10/23/2023 with sepsis.  Pharmacy has been consulted for Vancomycin dosing. Patient was initiated on Zosyn (piperacillin-tazobactam) on 10/31/23.   WBC trending down to 9.1, afebrile, HR 100s, RR >22, now intubated Scr trending up to 1.52  Plan: Initiate loading dose of Vancomycin 1750mg  IV x 1, followed by  Vancomycin 1250mg  IV q24h (eAUC ~482)    > Goal AUC 400-550    > Check vancomycin levels at steady state  Continue Zosyn 3.375g IV q8h extended-infusion per MD Monitor daily CBC, temp, SCr, and for clinical signs of improvement  F/u cultures and de-escalate antibiotics as able   Height: 6' (182.9 cm) Weight: 82.2 kg (181 lb 3.5 oz) IBW/kg (Calculated) : 77.6  Temp (24hrs), Avg:97.6 F (36.4 C), Min:97.3 F (36.3 C), Max:97.8 F (36.6 C)  Recent Labs  Lab 10/30/23 0440 10/31/23 0435 10/31/23 1814 11/01/23 0029 11/01/23 0425 11/01/23 0900 11/01/23 1131 11/01/23 1425  WBC 10.0 13.2* 15.8*  --  14.5*  --   --  9.1  CREATININE 1.18 1.36* 1.43* 1.55* 1.52*  --   --   --   LATICACIDVEN  --   --  1.7  --   --  2.5* 2.0* 1.7    Estimated Creatinine Clearance: 47.5 mL/min (A) (by C-G formula based on SCr of 1.52 mg/dL (H)).    Allergies  Allergen Reactions   Asa [Aspirin] Other (See Comments)    Stomach pain   Crestor [Rosuvastatin] Other (See Comments)    Stomach pain   Morphine Nausea And Vomiting   Wellbutrin [Bupropion] Other (See Comments)    Stomach pain   Zoloft [Sertraline Hcl] Other (See Comments)    Stomach pain    Antimicrobials this admission: Zosyn 11/13 >>  Vancomycin 11/14 >>   Dose adjustments this admission: N/A  Microbiology results: 11/6 MRSA PCR: not detected 11/13 Bcx x2: ngtd <24h 11/14 Sputum Cx: sent    Thank you for allowing pharmacy to be a part of this patient's care.  Wilburn Cornelia, PharmD, BCPS Clinical Pharmacist 11/01/2023 3:02 PM   Please refer to  AMION for pharmacy phone number

## 2023-11-01 NOTE — Progress Notes (Signed)
Tracheal aspirate sent to lab

## 2023-11-01 NOTE — Progress Notes (Signed)
Matthew Molina [id number] Admission Data: 11/01/2023  Attending Provider: Martina Sinner, MD  [id number], Milus Mallick, MD Consults/ Treatment Team: Treatment Team:  Lewie Chamber, DO Ccs, Md, MD  Matthew Molina is a 72 y.o. male patient admitted to [facility] [place] [facility], intubated, on IV  pressors.    Will cont to monitor and assist as needed.  Phineas Douglas, RN 11/01/2023 5:52 PM

## 2023-11-01 NOTE — Progress Notes (Signed)
Patient has been  starting to be alert, but not following commands, not responding to questions, has shallow and rapid  breath and is tachypneic, breathing was concerning, MD made aware and asked if he wants to do a repeat Blood gas on him, MD thinks it might be because of pain, gave him pain medicine, tachypnea resolved but still pattern of breathing same, no issues with oxygenation, other vital parameters WDL, might need further eval, will continue to monitor. Endorsed to Public relations account executive.

## 2023-11-01 NOTE — H&P (Signed)
NAME:  Matthew Molina, MRN:  [id number], DOB:  [birth year], LOS: 9 ADMISSION DATE:  10/23/2023, CONSULTATION DATE:  11/14 REFERRING MD:  Dr. Arbutus Leas, CHIEF COMPLAINT:  septic shock   History of Present Illness:  Patient is a 72 yo M w/ pertinent PMH IgG MGUS, COPD, DMT2, HTN, HLD presents to [facility] on 11/5 w/ abd pain, N/V.  Patient recently admitted to hospital on 10/13 w/ N/V and abd pain. Treated for aspiration pna. GI performed EGD showing grade B esophagitis, duodenitis, and gastric ulcer nonbleeding. Discharged 10/16 w/ PPI and sucralfate.   Patient began having gradual worsening abd pain over the past week. Also having some N/V and decreased po intake. Generalized weakness. Denies sob, diarrhea, chest pain. Came to [facility] on 11/5. On arrival hemodynamically stable and sats 92% on room air. Lipase 84. CT abd/pelvis w/ contrast marked distention of the stomach to the level of the duodenum where there is circumferential thickening and heterogenous hypoattenuation at the site of the patient's previous duodenitis; new hypodensity upstream portal vein suspicious for nonocclusive thrombus and partially imaged hypodensity in the right upper lobe pulmonary artery which may represent artifact versus PE. Started on heparin. GI and surgery consulted. Patient underwent gastrojejunostomy for concern of gastric outlet obstruction secondary to PUD/duodenitis on 10/26/2023. Patient started on TPN for short period and stopped 11/13.   Overnight on 11/3 patient with increased respiratory distress and AMS. Having some abd pain. CT head 2.3 cm hypodensity involving the superior right insula/right frontal operculum, age indeterminate, but could reflect an evolving acute ischemic infarct.  CT abd/pelvis w/ contrast showing small amount of free air throughout abd; new dilated gallbladder w/ multiple gallstones concerning for cholecystits. Surgery consulted and no intervention at this time. Patient w/ increasing AMS and required  intubation. Patient also w/ hypotension requiring levophed. Plan to transfer to [facility] and PCCM consulted.  Pertinent  Medical History   Past Medical History:  Diagnosis Date   Acute blood loss anemia 01/05/2020   Acute respiratory failure with hypoxia (HCC)    Arthritis    COPD (chronic obstructive pulmonary disease) (HCC)    Depression    Diabetes mellitus    Diabetes mellitus without complication (HCC)    Diverticulitis    Elevated PSA 10/01/2016   Encounter for support and coordination of transition of care 01/14/2020   Hypercholesterolemia    Hyperlipidemia    Hypertension    Insomnia 01/05/2016   Ischemic colitis (HCC) 01/05/2020   Multiple lung nodules on CT 04/02/2015   Nicotine addiction    Obesity    Oxygen deficiency    qhs   Schizophrenia (HCC)      Significant Hospital Events: Including procedures, antibiotic start and stop dates in addition to other pertinent events   11/5 admit to [facility] 11/14 transferred to [facility] for respiratory failure and septic shock. Intubated on levo. Pccm consulted  Interim History / Subjective:  On 35 mcg levo Patient on vent w/ audible cuff leak, ETT 24 @ lip On 100% fio2 sats 97%; patient belly breathing w/ RR uppers 20s  Objective   Blood pressure (!) 78/58, pulse 96, temperature (!) 97.3 F (36.3 C), temperature source Axillary, resp. rate (!) 31, height 6' (1.829 m), weight 82.2 kg, SpO2 93%.    Vent Mode: PRVC FiO2 (%):  [95 %-100 %] 100 % Set Rate:  [18 bmp] 18 bmp Vt Set:  [500 mL] 500 mL PEEP:  [5 cmH20] 5 cmH20 Plateau Pressure:  [13 cmH20] 13 cmH20  Intake/Output Summary (Last 24 hours) at 11/01/2023 1230 Last data filed at 11/01/2023 9629 Gross per 24 hour  Intake 2053.64 ml  Output 900 ml  Net 1153.64 ml   Filed Weights   10/30/23 0337 10/31/23 0343 10/31/23 1951  Weight: 83.2 kg 85.7 kg 82.2 kg    Examination: General:  critically ill appearing on mech vent HEENT: MM pink/moist; ETT in place Neuro:  recently given fentanyl for transport; not following commands; perrl CV: s1s2, tachy 110, no m/r/g PULM:  dim clear BS bilaterally; on mech vent PRVC GI: soft, abd staples in place without erythema/drainage Extremities: warm/dry, no edema  Skin: no rashes or lesions    Resolved Hospital Problem list     Assessment & Plan:   Acute respiratory failure w/ hypoxia Possible aspiration Hx of COPD Plan: -CXR showing ETT about 7 cm above carina; advanced 3 cm w/ improvement in cuff leak -LTVV strategy with tidal volumes of 6-8 cc/kg ideal body weight -check ABG and adjust settings accordingly  -Wean PEEP/FiO2 for SpO2 >92% -VAP bundle in place -Daily SAT and SBT -PAD protocol in place -wean sedation for RASS goal 0 to -1 -send tracheal aspirate -cont zosyn and add vanc -cont brovana, pulmicort, duoneb  Septic shock Plan: -cont levo and add vaso for map goal >65 -cont stress dose steroids -ABG w/ metabolic acidosis; give 2 amps of bicarb -cont maintenance iv fluids; trend LA -follow bcx2 and check trach aspirate -PCT 1.42 from 0.24 yesterday; cont zosyn and add vanc  Severe distal duodenal obstruction s/p Gastrojejunostomy 11/8 Cholelithiasis Plan: -Surgery following; appreciate recs -consider RUQ [place] to rule out cholecystitis -cont abx as above -PPI bid; sucralfate when able to take po -NPO  Acute metabolic encephalopathy: likely sepsis related but concern for possible stroke on CT Possible stroke -CT head 2.3 cm hypodensity involving superior right insula/right frontal operculum; possible evolving acute ischemic infarct; suggest MRI -ua, tsh, ammonia wnl Plan: -MRI brain pending; consider neuro consult -limit sedating meds -treat sepsis as above  Questionable portal venous thrombosis and PE -DVT US negative; echo 11/9 lvef >75%; liver doppler without evidence of portal venous thrombosis Plan: -cont heparin -tele monitoring  DMT2 Plan: -ssi and cbg  monitoring  CKD 3a Hypokalemia Hypercalcemia: likely tpn related; PTH level pending Plan: -Trend BMP / mag / urinary output -Replace electrolytes as indicated -Avoid nephrotoxic agents, ensure adequate renal perfusion  HTN HLD Plan: -hold anti-htn meds -statin  Depression/anxiety Plan: -home meds on hold while npo  Best Practice (right click and "Reselect all SmartList Selections" daily)   Diet/type: NPO DVT prophylaxis: systemic heparin GI prophylaxis: PPI Lines: N/A Foley:  N/A Code Status:  full code Last date of multidisciplinary goals of care discussion [11/14 updated daughter over phone]  Labs   CBC: Recent Labs  Lab 10/29/23 0402 10/30/23 0440 10/31/23 0435 10/31/23 1814 11/01/23 0425  WBC 8.0 10.0 13.2* 15.8* 14.5*  NEUTROABS  --   --   --   --  11.9*  HGB 11.2* 11.4* 11.5* 12.2* 11.9*  HCT 36.5* 36.3* 37.1* 38.2* 38.7*  MCV 89.5 87.9 87.5 87.8 87.0  PLT 227 344 279 203 178    Basic Metabolic Panel: Recent Labs  Lab 10/27/23 0513 10/28/23 0338 10/28/23 1349 10/29/23 0402 10/30/23 0440 10/31/23 0435 10/31/23 1814 11/01/23 0029 11/01/23 0425  NA 133* 133*   < > 132* 132* 130* 130* 133* 135  K 2.6* 2.7*   < > 3.9 3.6 4.0 4.4 3.9 3.8  CL 100  101   < > 104 102 108 104 104 104  CO2 23 24   < > 18* 18* 15* 13* 18* 17*  GLUCOSE 199* 216*   < > 235* 316* 285* 288* 228* 155*  BUN 19 29*   < > 37* 50* 72* 84* 81* 81*  CREATININE 1.14 0.98   < > 1.00 1.18 1.36* 1.43* 1.55* 1.52*  CALCIUM 9.2 9.5   < > 10.1 11.0* 11.1* 12.2* 11.7* 11.9*  MG 2.1 2.1  --  2.1  --  2.3  --   --  2.2  PHOS 2.7  --   --  2.2* 3.4 3.4  --   --  4.1   < > = values in this interval not displayed.   GFR: Estimated Creatinine Clearance: 47.5 mL/min (A) (by C-G formula based on SCr of 1.52 mg/dL (H)). Recent Labs  Lab 10/30/23 0440 10/31/23 0435 10/31/23 1715 10/31/23 1814 11/01/23 0425 11/01/23 0801 11/01/23 0900 11/01/23 1131  PROCALCITON  --   --  0.24  --   --   1.42  --   --   WBC 10.0 13.2*  --  15.8* 14.5*  --   --   --   LATICACIDVEN  --   --   --  1.7  --   --  2.5* 2.0*    Liver Function Tests: Recent Labs  Lab 10/27/23 0513 10/28/23 0338 10/29/23 0402 10/31/23 0435 11/01/23 0425  AST 13* 11* 16 32 60*  ALT 13 12 12 26  65*  ALKPHOS 32* 31* 34* 40 48  BILITOT 0.8 0.6 0.5 0.5 0.9  PROT 6.4* 6.3* 6.8 7.3 7.5  ALBUMIN 3.2* 3.0* 3.0* 3.2* 3.2*   No results for input(s): "LIPASE", "AMYLASE" in the last 168 hours. Recent Labs  Lab 10/31/23 1715  AMMONIA 32    ABG    Component Value Date/Time   PHART 7.37 11/01/2023 0742   PCO2ART 27 (L) 11/01/2023 0742   PO2ART 74 (L) 11/01/2023 0742   HCO3 15.8 (L) 11/01/2023 0742   TCO2 28 10/26/2023 1351   ACIDBASEDEF 8.2 (H) 11/01/2023 0742   O2SAT 96.3 11/01/2023 0742     Coagulation Profile: No results for input(s): "INR", "PROTIME" in the last 168 hours.  Cardiac Enzymes: No results for input(s): "CKTOTAL", "CKMB", "CKMBINDEX", "TROPONINI" in the last 168 hours.  HbA1C: HbA1c, POC (controlled diabetic range)  Date/Time Value Ref Range Status  08/09/2022 10:26 AM 7.0 0.0 - 7.0 % Final   Hgb A1c MFr Bld  Date/Time Value Ref Range Status  11/01/2023 04:25 AM 6.0 (H) 4.8 - 5.6 % Final    Comment:    (NOTE) Pre diabetes:          5.7%-6.4%  Diabetes:              >6.4%  Glycemic control for   <7.0% adults with diabetes   10/12/2023 04:07 PM 6.0 (H) 4.8 - 5.6 % Final    Comment:             Prediabetes: 5.7 - 6.4          Diabetes: >6.4          Glycemic control for adults with diabetes: <7.0     CBG: Recent Labs  Lab 10/31/23 2159 10/31/23 2358 11/01/23 0435 11/01/23 0732 11/01/23 1138  GLUCAP 215* 216* 147* 165* 208*    Review of Systems:   Patient is encephalopathic and/or intubated; therefore, history has been obtained from chart  review.    Past Medical History:  He,  has a past medical history of Acute blood loss anemia (01/05/2020), Acute respiratory  failure with hypoxia (HCC), Arthritis, COPD (chronic obstructive pulmonary disease) (HCC), Depression, Diabetes mellitus, Diabetes mellitus without complication (HCC), Diverticulitis, Elevated PSA (10/01/2016), Encounter for support and coordination of transition of care (01/14/2020), Hypercholesterolemia, Hyperlipidemia, Hypertension, Insomnia (01/05/2016), Ischemic colitis (HCC) (01/05/2020), Multiple lung nodules on CT (04/02/2015), Nicotine addiction, Obesity, Oxygen deficiency, and Schizophrenia (HCC).   Surgical History:   Past Surgical History:  Procedure Laterality Date   BIOPSY  05/28/2023   Procedure: BIOPSY;  Surgeon: Lanelle Bal, DO;  [facility];  Service: Endoscopy;;   BIOPSY  10/02/2023   Procedure: BIOPSY;  Surgeon: Dolores Frame, MD;  [facility];  Service: Gastroenterology;;   BIOPSY  10/24/2023   Procedure: BIOPSY;  Surgeon: Corbin Ade, MD;  [facility];  Service: Endoscopy;;   CATARACT EXTRACTION W/PHACO Left 11/20/2013   Procedure: CATARACT EXTRACTION PHACO AND INTRAOCULAR LENS PLACEMENT (IOC);  Surgeon: Gemma Payor, MD;  Location: [facility];  Service: Ophthalmology;  Laterality: Left;  CDE:10.26   CATARACT EXTRACTION W/PHACO Right 12/08/2013   Procedure: RIGHT EYE CATARACT EXTRACTION PHACO AND INTRAOCULAR LENS PLACEMENT ;  Surgeon: Gemma Payor, MD;  Location: [facility];  Service: Ophthalmology;  Laterality: Right;  CDE 12.38   COLONOSCOPY N/A 04/01/2013   pancolonic diverticulosis, redundant colon, large internal hemorrhoids.    COLONOSCOPY  2014   INCOMPLETE PREP IN R COLON   COLONOSCOPY N/A 04/01/2020   Procedure: COLONOSCOPY;  Surgeon: West Bali, MD;  [facility];  Service: Endoscopy;  Laterality: N/A;  8:45am   ESOPHAGOGASTRODUODENOSCOPY N/A 02/18/2016   stricture at GE junction, moderate erosive gastritis and mild non-erosive duodenitis. +H.pylori. Treated initially with Prevpac. Breath test to check  for eradication was positive, and he was prescribed Pylera.    ESOPHAGOGASTRODUODENOSCOPY (EGD) WITH PROPOFOL N/A 05/28/2023   Procedure: ESOPHAGOGASTRODUODENOSCOPY (EGD) WITH PROPOFOL;  Surgeon: Lanelle Bal, DO;  [facility];  Service: Endoscopy;  Laterality: N/A;  11:15am, asa 3   ESOPHAGOGASTRODUODENOSCOPY (EGD) WITH PROPOFOL N/A 10/02/2023   Procedure: ESOPHAGOGASTRODUODENOSCOPY (EGD) WITH PROPOFOL;  Surgeon: Dolores Frame, MD;  [facility];  Service: Gastroenterology;  Laterality: N/A;   ESOPHAGOGASTRODUODENOSCOPY (EGD) WITH PROPOFOL N/A 10/24/2023   Procedure: ESOPHAGOGASTRODUODENOSCOPY (EGD) WITH PROPOFOL;  Surgeon: Corbin Ade, MD;  [facility];  Service: Endoscopy;  Laterality: N/A;   EYE SURGERY Left 11/2013   cataract extraction   GASTROJEJUNOSTOMY N/A 10/26/2023   Procedure: GASTROJEJUNOSTOMY;  Surgeon: Lewie Chamber, DO;  Location: [facility];  Service: General;  Laterality: N/A;   POLYPECTOMY  04/01/2020   Procedure: POLYPECTOMY;  Surgeon: West Bali, MD;  [facility];  Service: Endoscopy;;   SAVORY DILATION N/A 02/18/2016   Procedure: SAVORY DILATION;  Surgeon: West Bali, MD;  [facility];  Service: Endoscopy;  Laterality: N/A;   SHOULDER SURGERY Right      Social History:   reports that he has been smoking cigarettes. He has a 48 pack-year smoking history. He has never used smokeless tobacco. He reports current alcohol use. He reports that he does not use drugs.   Family History:  His family history includes Diabetes in his mother and sister; Drug abuse in his brother; Heart disease in his sister; Hypertension in his mother; Kidney disease in his father; Stroke in his mother. There  is no history of Colon cancer or Colon polyps.   Allergies Allergies  Allergen Reactions   Asa [Aspirin] Other (See Comments)    Stomach pain   Crestor [Rosuvastatin] Other (See Comments)    Stomach  pain   Morphine Nausea And Vomiting   Wellbutrin [Bupropion] Other (See Comments)    Stomach pain   Zoloft [Sertraline Hcl] Other (See Comments)    Stomach pain     Home Medications  Prior to Admission medications   Medication Sig Start Date End Date Taking? Authorizing Provider  acetaminophen (TYLENOL) 500 MG tablet Take 1,000 mg by mouth 2 (two) times daily as needed for headache, moderate pain or fever.   Yes [provider]  albuterol (VENTOLIN HFA) 108 (90 Base) MCG/ACT inhaler INHALE 2 PUFFS INTO THE LUNGS EVERY 6 HOURS AS NEEDED FOR WHEEZING OR SHORTNESS OF BREATH. 10/17/23  Yes Kerri Perches, MD  busPIRone (BUSPAR) 7.5 MG tablet Take 7.5 mg by mouth 3 (three) times daily.   Yes [provider]  FARXIGA 5 MG TABS tablet Take 5 mg by mouth daily. 07/27/22  Yes [provider]  fenofibrate (TRICOR) 145 MG tablet Take 1 tablet (145 mg total) by mouth daily. 09/17/23  Yes Kerri Perches, MD  ipratropium-albuterol (DUONEB) 0.5-2.5 (3) MG/3ML SOLN INHALE 1 VIAL VIA NEBULIZER EVERY 4 TO 6 HOURS. Patient taking differently: Take 3 mLs by nebulization 2 (two) times daily. 06/27/23  Yes Kerri Perches, MD  linaclotide [facility]) 145 MCG CAPS capsule Take 1 capsule (145 mcg total) by mouth daily before breakfast. 08/08/23  Yes Mahon, Courtney L, NP  montelukast (SINGULAIR) 10 MG tablet TAKE (1) TABLET BY MOUTH AT BEDTIME. 08/21/23  Yes Kerri Perches, MD  Multiple Vitamins-Minerals (MULTIVITAMIN WITH MINERALS) tablet Take 1 tablet by mouth daily.   Yes [provider]  ondansetron (ZOFRAN-ODT) 4 MG disintegrating tablet DISSOLVE 1 TABLET ON TONGUE ONCE DAILY AS NEEDED. Patient taking differently: Take 4 mg by mouth every 8 (eight) hours as needed for nausea or vomiting. 09/13/23  Yes Mahon, Courtney L, NP  pantoprazole (PROTONIX) 40 MG tablet Take 1 tablet (40 mg total) by mouth 2 (two) times daily. 10/03/23 10/02/24 Yes Shah, Pratik D, DO   risperidone (RISPERDAL) 4 MG tablet TAKE ONE TABLET BY MOUTH IN THE EVENING. TAKE WITH 1MG  TABLET. Patient taking differently: Take 4 mg by mouth at bedtime. 07/20/23  Yes Kerri Perches, MD  sucralfate (CARAFATE) 1 GM/10ML suspension Take 10 mLs (1 g total) by mouth 4 (four) times daily -  with meals and at bedtime. 10/16/23  Yes Mahon, Frederik Schmidt, NP  SYMBICORT 160-4.5 MCG/ACT inhaler INHALE 2 PUFFS INTO THE LUNGS ONCE IN THE MORNING AND AT BEDTIME. 10/17/23  Yes Kerri Perches, MD  tamsulosin (FLOMAX) 0.4 MG CAPS capsule Take 1 capsule (0.4 mg total) by mouth daily after supper. 09/12/23  Yes McKenzie, Mardene Celeste, MD  traZODone (DESYREL) 50 MG tablet TAKE ONE TABLET BY MOUTH AT BEDTIME AS NEEDED FOR SLEEP. 10/17/23  Yes Kerri Perches, MD  ACCU-CHEK GUIDE test strip USE TO CHECK BLOOD SUGAR ONCE DAILY 07/31/23   Kerri Perches, MD  Accu-Chek Softclix Lancets lancets TEST BLOOD SUGAR ONCE DAILY AS DIRECTED. 10/19/23   Kerri Perches, MD  risperiDONE (RISPERDAL) 1 MG tablet Take 1 tablet (1 mg total) by mouth at bedtime. 09/26/23   Kerri Perches, MD  sulfamethoxazole-trimethoprim (BACTRIM DS) 800-160 MG tablet Take 1 tablet by mouth daily. Take  the morning of procedure on 11/14/23 10/30/23   Malen Gauze, MD  UNABLE TO FIND Med Name: walking cane 10/12/23   Kerri Perches, MD  sildenafil (VIAGRA) 100 MG tablet Take 100 mg by mouth. Take one tablet 30 mins before intercourse   03/11/12  [provider]     Critical care time: 50 minutes     JD Anselm Lis  Pulmonary & Critical Care 11/01/2023, 12:30 PM  Please see [email or web address] for pager details.  From 7A-7P if no response, please call [phone]. After hours, please call ELink [phone].

## 2023-11-01 NOTE — Progress Notes (Signed)
[facility] Progress Note  6 Days Post-Op  Subjective: Patient seen and examined.  He is minimally responsive on examination to sternal rub.  He is tachycardic and tachypneic into the 40s.  He is currently on levophed of 10.  Overnight, he had altered mentation, so rapid response was called.  He underwent a CT of the head which demonstrated concern for possible evolving acute ischemic stroke.  He also underwent a CT of the abdomen and pelvis, which demonstrated concern for emphysematous cholecystitis, distended stomach, pneumoperitoneum, and a heterogeneous masslike density in the first/second portion of the duodenum concerning for possible hematoma, mass, for infection.  Upon my evaluation of the CT, his gallbladder is unremarkable, and radiology has addended the report.  Objective: Vital signs in last 24 hours: Temp:  [97.4 F (36.3 C)-97.8 F (36.6 C)] 97.5 F (36.4 C) (11/14 0757) Pulse Rate:  [92-125] 96 (11/14 0813) Resp:  [19-39] 31 (11/14 0813) BP: (61-168)/(22-112) 78/58 (11/14 0813) SpO2:  [90 %-100 %] 93 % (11/14 1116) FiO2 (%):  [95 %-100 %] 100 % (11/14 1116) Weight:  [82.2 kg] 82.2 kg (11/13 1951) Last BM Date : 10/30/23  Intake/Output from previous day: 11/13 0701 - 11/14 0700 In: 2053.6 [I.V.:1981.2; IV Piggyback:72.5] Out: 900 [Urine:900] Intake/Output this shift: No intake/output data recorded.  General appearance: altered and minimally responsive Nose: NG tube in place with bilious output GI: abdomen soft, moderate distention, non tender to palpation; no rigidity or involuntary guarding  Lab Results:  Recent Labs    10/31/23 1814 11/01/23 0425  WBC 15.8* 14.5*  HGB 12.2* 11.9*  HCT 38.2* 38.7*  PLT 203 178   BMET Recent Labs    11/01/23 0029 11/01/23 0425  NA 133* 135  K 3.9 3.8  CL 104 104  CO2 18* 17*  GLUCOSE 228* 155*  BUN 81* 81*  CREATININE 1.55* 1.52*  CALCIUM 11.7* 11.9*   PT/INR No results for input(s):  "LABPROT", "INR" in the last 72 hours.  Studies/Results: CT ABDOMEN PELVIS W CONTRAST  Addendum Date: 11/01/2023   ADDENDUM REPORT: 11/01/2023 08:10 ADDENDUM: Received a call concerning gallbladder findings. Impression 4 most likely reflects description of the dilated distal stomach. The gallbladder is nondistended and there are no calcified stones. Electronically Signed   By: Tiburcio Pea M.D.   On: 11/01/2023 08:10   Result Date: 11/01/2023 CLINICAL DATA:  Peritonitis or perforation suspected. EXAM: CT ABDOMEN AND PELVIS WITH CONTRAST TECHNIQUE: Multidetector CT imaging of the abdomen and pelvis was performed using the standard protocol following bolus administration of intravenous contrast. RADIATION DOSE REDUCTION: This exam was performed according to the departmental dose-optimization program which includes automated exposure control, adjustment of the mA and/or kV according to patient size and/or use of iterative reconstruction technique. CONTRAST:  OMNIPAQUE IOHEXOL 300 MG/ML  SOLN COMPARISON:  CT chest abdomen and pelvis 10/24/2023 FINDINGS: Lower chest: There is atelectasis in the lung bases. Hepatobiliary: No focal liver abnormality is seen. The gallbladder is now dilated and contains multiple gallstones. There is a new small focus of air in the gallbladder. Findings concerning for complex dilated common bile duct. There is inflammatory stranding surrounding the common bile duct. Pancreas: Unremarkable. No pancreatic ductal dilatation or surrounding inflammatory changes. Spleen: Normal in size without focal abnormality. Adrenals/Urinary Tract: There is a small fat containing right adrenal nodule compatible with myelolipoma measuring 7 mm. The left adrenal gland is within normal limits. There are bilateral renal cysts. The largest is in the superior pole the  right kidney measuring 4.8 cm. Mildly hyperdense lesion in the inferior pole of the left kidney measures 2.5 cm and appears  unchanged. There is no hydronephrosis or perinephric fat stranding. Tiny bladder calculus is seen lying in the dependent portion of the bladder. Stomach/Bowel: There is wall thickening of the distal esophagus. There is a new gastroduodenal anastomosis near the greater curvature of the stomach. The stomach is markedly dilated with large air-fluid level as seen on prior. Heterogeneous masslike density seen in the region of the first and second portion of the duodenal measuring 5.3 x 4.5 by 5.9 cm with some new central fluid areas of attenuation/low-density. There is surrounding inflammatory stranding. There is a new small amount of free air throughout the upper abdomen. Remaining small bowel and colon are within normal limits. Oral contrast reaches the rectum. There are few scattered sigmoid colon diverticula. The appendix is not seen. Vascular/Lymphatic: Aortic atherosclerosis. No enlarged abdominal or pelvic lymph nodes. Reproductive: Prostate gland is enlarged. Other: Small fat containing umbilical hernia. There is no ascites. Small amount of free air throughout the upper abdomen. No evidence for abscess. Musculoskeletal: The bones are osteopenic. Degenerative changes affect the spine. IMPRESSION: 1. New small amount of free air throughout the upper abdomen may be related to recent abdominal surgery versus perforated viscus. 2. New gastroduodenal anastomosis. 3. Heterogeneous masslike density in the region of the first and second portion of the duodenum with some new central fluid areas of attenuation. Findings may represent hematoma, mass and/or infection. 4. New dilated gallbladder with multiple gallstones. There is a new small focus of air in the gallbladder. Findings are concerning for cholecystitis with possible emphysematous cholecystitis. Findings may also be related to direct connection of gallbladder with duodenum. Surgical consultation recommended. 5. Complex dilated common bile duct with inflammatory  stranding surrounding the common bile duct. Findings may be related to choledocholithiasis. 6. Stable indeterminate 2.5 cm left renal lesion. This can be further evaluated with MRI. 7. Stable marked dilatation of the stomach with large air-fluid level. 8. Stable wall thickening of the distal esophagus. Aortic Atherosclerosis (ICD10-I70.0) and Emphysema (ICD10-J43.9). These results were called by telephone at the time of interpretation on 10/31/2023 at 10:47 pm to provider OLADAPO ADEFESO , who verbally acknowledged these results. Electronically Signed: By: Darliss Cheney M.D. On: 10/31/2023 22:48   DG CHEST PORT 1 VIEW  Result Date: 11/01/2023 CLINICAL DATA:  Recent abdominal surgery. Respiratory distress. [id number]. EXAM: PORTABLE CHEST 1 VIEW COMPARISON:  Portable chest 10/31/2023 FINDINGS: 7:33 a.m. Right PICC terminates in the mid SVC at the azygous confluence. There has been a slight interval pullback. There is mild cardiomegaly but no evidence of CHF. The mediastinal configuration is stable with aortic tortuosity and atherosclerosis. The lungs are clear apart from again noted small left pleural effusion. No new osseous abnormality. Free air underneath the domes of the diaphragm is less conspicuous today. IMPRESSION: 1. Right PICC tip in the SVC at the azygous confluence with a slight interval pullback. 2. Small left pleural effusion. No other acute radiographic chest findings. 3. Free air underneath the domes of the diaphragm is less conspicuous today. Electronically Signed   By: Almira Bar M.D.   On: 11/01/2023 07:53   CT HEAD WO CONTRAST ( )  Result Date: 10/31/2023 CLINICAL DATA:  Initial evaluation for altered mental status. EXAM: CT HEAD WITHOUT CONTRAST TECHNIQUE: Contiguous axial images were obtained from the base of the skull through the vertex without intravenous contrast. RADIATION DOSE REDUCTION: This exam was  performed according to the departmental dose-optimization program which  includes automated exposure control, adjustment of the mA and/or kV according to patient size and/or use of iterative reconstruction technique. COMPARISON:  Prior CT from 10/30/2015. FINDINGS: Brain: Age-related cerebral atrophy with moderately advanced chronic microvascular ischemic disease. No acute intracranial hemorrhage. 2.3 cm hypodensity seen involving the superior right insula/right frontal operculum, age indeterminate, but could reflect an evolving acute ischemic infarct (series 2, image 17). No other acute large vessel territory infarct. No mass lesion or midline shift. No hydrocephalus or extra-axial fluid collection. Vascular: No abnormal hyperdense vessel. Calcified atherosclerosis present at the skull base. Skull: Scalp soft tissues demonstrate no acute finding. Calvarium intact. Sinuses/Orbits: Globes orbital soft tissues within normal limits. Paranasal sinuses and mastoid air cells are clear. Other: None. IMPRESSION: 1. 2.3 cm hypodensity involving the superior right insula/right frontal operculum, age indeterminate, but could reflect an evolving acute ischemic infarct. Correlation with dedicated MRI suggested for further evaluation. 2. No other acute intracranial abnormality. 3. Age-related cerebral atrophy with moderately advanced chronic microvascular ischemic disease. Electronically Signed   By: Rise Mu M.D.   On: 10/31/2023 22:13   DG Chest Port 1 View  Result Date: 10/31/2023 CLINICAL DATA:  Respiratory distress.  Recent abdominal surgery. EXAM: PORTABLE CHEST 1 VIEW COMPARISON:  Abdominal x-ray 10/30/2023. FINDINGS: Right upper extremity PICC terminates over the SVC. The lungs are clear. There is no pleural effusion or pneumothorax. The cardiomediastinal silhouette is within normal limits. There is a small amount of free air in the upper abdomen. IMPRESSION: 1. No active disease in the chest. 2. Small amount of free air in the upper abdomen, likely related to recent abdominal  surgery. Please correlate clinically. If there is high clinical concern for acute pathology, recommend CT. Electronically Signed   By: Darliss Cheney M.D.   On: 10/31/2023 19:56   DG Abd 1 View  Result Date: 10/30/2023 CLINICAL DATA:  Status post bypass gastrojejunostomy. EXAM: ABDOMEN - 1 VIEW COMPARISON:  Upper GI examination on 10/28/2020 and abdominal radiograph on 10/30/2023 at 1248 hours FINDINGS: Upper abdomen is only partially imaged. There is retained contrast in the stomach. Nasogastric tube is partially imaged. There is contrast within the pelvis and right side of the abdomen. There may be a small amount of contrast in the right colon. IMPRESSION: 1. Residual contrast in the stomach. Stomach is only partially imaged on this examination. 2. Contrast in small bowel loops and probably right colon. Electronically Signed   By: Richarda Overlie M.D.   On: 10/30/2023 16:59    Anti-infectives: Anti-infectives (From admission, onward)    Start     Dose/Rate Route Frequency Ordered Stop   10/31/23 2200  piperacillin-tazobactam (ZOSYN) IVPB 3.375 g        3.375 g 12.5 mL/hr over 240 Minutes Intravenous Every 8 hours 10/31/23 1753     10/26/23 1400  ceFAZolin (ANCEF) IVPB 2g/100 mL premix        2 g 200 mL/hr over 30 Minutes Intravenous On call to O.R. 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  He is status post open gastrojejunostomy on 11/8.   -  Upper GI performed 11/11, and contrast was stable in the stomach, and was not moving down the gastrojejunostomy.  KUB on 11/12 demonstrates contrast within the small bowel and colon  -Patient with significantly altered mentation overnight prompting CTs  of the head and abdomen/pelvis.   -Upon my review of the CT of the abdomen and pelvis, the gallbladder is unremarkable without any gallstones present   There is pneumoperitoneum, but I expect some pneumoperitoneum with the patient being 6 days postop.  The anastomosis appears open and fluid-filled without any current evidence of leak.  He does not have any free fluid which I would expect if he did have a leak, in addition there is no extravasation of any contrast from his upper GI that was performed earlier this week.  The stomach is significantly distended, so we will place an NG tube.  There is this inflammatory process going on in the duodenum (location of his previous duodenitis), but I do not see any free air in this area or other findings to suggest the patient needs urgent surgical intervention -Patient with increasing leukocytosis today, 14.5 -Broad spectrum antibiotics initiated  -Blood cultures obtained yesterday and are negative with no growth to date thus far -NPO -NG tube to LIS -Recommend intubation given the patient's significant tachypnea and altered mentation -Critical care consulted, and patient to be transferred to [facility] ICU -Heparin gtt per primary team -Updated [facility] about patient transfer -Appreciate hospitalist recommendations   LOS: 9 days    Betzaida Cremeens A Lavanna Rog 11/01/2023

## 2023-11-01 NOTE — Progress Notes (Signed)
Called for emergency intubation in ICU due to respiratory failure.   BP systolic 108 and heart rate 110 prior to intubation.  Etomidate 10 mg, ketamine 25 mg, and succinylcholine 100 mg given IV and 8.0 ETT placed with glidescope with no difficulty.  Taped at 22 cm at lip.  Ventilator started by respiratory therapy.  BP decreased to 68 systolic and dialed up Norepinephrine infusion to 40.  BP gradually stabilized.  BBS =. + CO2.       Ronelle Nigh MD   anesthesia

## 2023-11-01 NOTE — Progress Notes (Addendum)
Responded to nursing call:  less responsive, tachypnea   Subjective: ROS unobtainable due to altered mentation  Vitals:   11/01/23 0807 11/01/23 0810 11/01/23 0813 11/01/23 1116  BP: (!) 68/38 (!) 68/38 (!) 78/58   Pulse: 100 96 96   Resp: (!) 30 (!) 32 (!) 31   Temp:      TempSrc:      SpO2: 93% 91% 91% 93%  Weight:      Height:       CV--RRR Lung--bilateral rhonchi Abd--soft+BS/NT   Assessment/Plan: Acute respiratory Failure/SIRS -increasing obtundation in last 6 hours -not protecting airway -intubated with anesthesia assistance -suspect a degree of aspiration -discussed with PCCM--Dr. Dewald--transfer to [facility]  Hypotension -concerned about sepsis -lactate 2.5 -bolus LR -restart IVF -continue zosyn -add vanc -continue levophed -add solucortef  S/p gastrojejunostomy -pt had severe peptic stricture of duodenum -discussed with surgery regarding CT findings from 11/14 am--GB is not inflammed, misread.  Feel his stomach is distended.  GOC -family/daughter updated -remains full code -family agrees with transfer to [facility]     Onalee Hua Rodney Yera, DO [facility]

## 2023-11-01 NOTE — Progress Notes (Addendum)
PROGRESS NOTE  DAWSYN KALKMAN QQV:[id number] DOB: 02/08/1951 DOA: 10/23/2023 PCP: Kerri Perches, MD  Brief History:  72 year old male with a history of IgG MGUS, COPD, diabetes mellitus type 2, hypertension, hyperlipidemia, depression, tobacco abuse, coronary disease presenting with generalized weakness, abdominal pain, nausea and vomiting. The patient was recently admitted to the hospital from 09/30/2023 to 10/03/2023.  At that time, the patient had nausea and vomiting and poor oral intake and upper abdominal pain as well.  The patient was treated for sepsis secondary to aspiration pneumonia.  GI was consulted during the admission.  The patient underwent EGD on 10/02/2023 which showed grade B esophagitis without bleeding, nonbleeding gastric ulcers with a clean base, and duodenitis.  The patient was discharged home with PPI twice daily and sulcrafate.  The patient states that he was feeling well after d/c and he was tolerating a soft diet. However, he but began having gradual abdominal pain and belching in the past 1 week.  He states that things have gradually worsened over the past 3 days prior to admission with increasing belching and hiccups that has resulted in vomiting every time he belches.  He has had poor oral intake and progressive generalized weakness.  He also complains of worsening mid abdominal pain.  He denies f/c, cp, sob, hemoptysis, diarrhea, hematochezia, melena, dysuria, hematuria.  As result of abd pain with n/v, the patient presented for further evaluation and treatment.  He continues to smoke a few cigarette's a day. Notably, patient states he ate spaghetti and chocolate chip cookies before he came to ED and did not have any emesis.   In the ED, the patient was afebrile hemodynamically stable with oxygen saturation 92% on room air.  WBC 6.0, hemoglobin 11.1, platelets 332.  Sodium 132, potassium 3.1, bicarbonate 28, serum creatinine 1.53.  LFTs were unremarkable.   Lipase 84.  CT of the abdomen and pelvis showed marked distention of the stomach to the level of the duodenum where there is circumferential thickening and heterogenous hypoattenuation at the site of the patient's previous duodenitis. There was a new hypodensity upstream portal vein suspicious for nonocclusive thrombus.  There is also a partially imaged hypodensity in the right upper lobe pulmonary artery which may represent artifact versus PE. The patient was started on IV heparin. -GI was consulted to assist with management. The patient subsequently underwent an open gastrojejunostomy on 10/26/2023.  His postoperative course was fairly unremarkable and the patient was started on TPN for short period of time.  This was discontinued on 10/31/2023.  On the evening of 10/31/2023 the patient developed respiratory distress and declining mental status.  He was moved to the stepdown unit.   Assessment/Plan: Severe distal duodenal obstruction  - GI recommended CTA abdomen completed to rule out ischemic process, consult general surgery for gastrojejunostomy for drainage  - celiac, SMA, IMA patent - continue pantoprazole IV bid - Postop s/p gastrojejunostomy on 10/26/23  - pharm D consulted to start TPN on 11/9 - PICC line placed  - upper GI ordered by surgery  11/11 >>>11/13 contrast in SB loops and Right colon>>diet advanced   Respiratory Distress/Acute respiratory failure with hypoxia --increase RR evening 10/31/23 -11/01/23--patient continues to have increased work of breathing -Repeat chest x-ray -Suspected degree of aspiration and possibly fluid overload -start empiric zosyn -10/31/23 ABG--7.41/22/62/14 on RA>>>place on 2L -repeat ABG 11/14 am -already on IV heparin -lasix 40 mg IV x 1   Acute metabolic  Encephalopathy -less interactive 10/30/13 -Multifactorial including possible stroke, hypercalcemia, and possible new infectious process -ABG--7.41/22/62/24 on  RA -B12--1334-- -Tsh--1.765 -ammonia--32 -UA--no pyuria -11/13 CT brain--2.3 cm hypodensity R-insula -obtain MR brain  Cholelithiasis -10/31/23 CT abd--new dilated GB with stones with small focus in GB;  complex dilated CBD with stranding;  heterogenous mass like density first and 2nd portion of duodenum with new central fluid attenuation -RUQ ultrasound -may need MRCP pending surgery opinion  Hypercalcemia -likely due to TPN and immobility -corrected calcium 12.5 -d/c TPN -was started on IVF   Metabolic acidosis -10/31/23 ABG as above -check lactate--1.7 -contributing to his tachypnea  Question of portal venous thrombosis Question of PE - CTA chest/abd/pelvis unfortunately not able to evaluate for PE given timing of contrast bolus given  - liver doppler without evidence of portal vein thrombosis  - IV heparin infusion resumed for full dose anticoagulation - D dimer markedly elevated at 3.65 so we presume he does have a PE - check venous dopplers of legs to assess for DVT -- negative for DVT - check TTE to evaluate for right heart strain : Left ventricular ejection fraction, by estimation, is >75%. The left ventricle has normal function. There is moderate left ventricular hypertrophy. Left ventricular diastolic parameters were normal. This was a technically difficult study.  RV grossly normal   CKD stage IIIa -Baseline creatinine 1.1-1.4 -Monitor daily BMP   Hypokalemia - repleted  - IV replacement ordered - Mg replaced   COPD -Restarted BDs -Continue Singulair when able to tolerate p.o.   Tobacco abuse -Tobacco cessation discussed   Depression/anxiety -restart risperdal and buspar when able to tolerate po   Controlled diabetes mellitus type 2 TPN ASSOCIATED HYPERGLYCEMIA -increased semglee to 30 units daily and monitor to titrate -continue resistant SSI coverage  -10/07/2023 hemoglobin A1c 6.0 -d/c semglee for now as TPN is stopped  BPH -Restart tamsulosin  when able to tolerate p.o       Family Communication:   daughter updated 11/13   Consultants:  general surgery   Code Status:  FULL    DVT Prophylaxis:  IV Heparin     Procedures: As Listed in Progress Note Above   Antibiotics: None     The patient is critically ill with multiple organ systems failure and requires high complexity decision making for assessment and support, frequent evaluation and titration of therapies, application of advanced monitoring technologies and extensive interpretation of multiple databases.  Critical care time - 35 mins.    Subjective: Pt is encephalopathic.  ROS is not possible.  Having some increase WOB  Objective: Vitals:   11/01/23 0323 11/01/23 0400 11/01/23 0546 11/01/23 0600  BP: 128/87 124/77 (!) 148/92 (!) 164/99  Pulse: (!) 112 (!) 112 92 94  Resp: (!) 34 (!) 30 (!) 39 (!) 29  Temp:   97.8 F (36.6 C)   TempSrc:   Oral   SpO2: 93% 93% 91% 92%  Weight:      Height:        Intake/Output Summary (Last 24 hours) at 11/01/2023 0726 Last data filed at 11/01/2023 0981 Gross per 24 hour  Intake 2053.64 ml  Output 900 ml  Net 1153.64 ml   Weight change: -3.5 kg Exam:  General:  Pt is alert, does not follow commands appropriately,  HEENT: No icterus, No thrush, No neck mass, [place]/AT Cardiovascular: RRR, S1/S2, no rubs, no gallops Respiratory: bilateral rhonchi Abdomen: Soft/+BS, non tender, non distended, no guarding Extremities: No edema, No lymphangitis, No petechiae,  No rashes, no synovitis   Data Reviewed: I have personally reviewed following labs and imaging studies Basic Metabolic Panel: Recent Labs  Lab 10/27/23 0513 10/28/23 0338 10/28/23 1349 10/29/23 0402 10/30/23 0440 10/31/23 0435 10/31/23 1814 11/01/23 0029 11/01/23 0425  NA 133* 133*   < > 132* 132* 130* 130* 133* 135  K 2.6* 2.7*   < > 3.9 3.6 4.0 4.4 3.9 3.8  CL 100 101   < > 104 102 108 104 104 104  CO2 23 24   < > 18* 18* 15* 13* 18* 17*   GLUCOSE 199* 216*   < > 235* 316* 285* 288* 228* 155*  BUN 19 29*   < > 37* 50* 72* 84* 81* 81*  CREATININE 1.14 0.98   < > 1.00 1.18 1.36* 1.43* 1.55* 1.52*  CALCIUM 9.2 9.5   < > 10.1 11.0* 11.1* 12.2* 11.7* 11.9*  MG 2.1 2.1  --  2.1  --  2.3  --   --  2.2  PHOS 2.7  --   --  2.2* 3.4 3.4  --   --  4.1   < > = values in this interval not displayed.   Liver Function Tests: Recent Labs  Lab 10/27/23 0513 10/28/23 0338 10/29/23 0402 10/31/23 0435 11/01/23 0425  AST 13* 11* 16 32 60*  ALT 13 12 12 26  65*  ALKPHOS 32* 31* 34* 40 48  BILITOT 0.8 0.6 0.5 0.5 0.9  PROT 6.4* 6.3* 6.8 7.3 7.5  ALBUMIN 3.2* 3.0* 3.0* 3.2* 3.2*   No results for input(s): "LIPASE", "AMYLASE" in the last 168 hours. Recent Labs  Lab 10/31/23 1715  AMMONIA 32   Coagulation Profile: No results for input(s): "INR", "PROTIME" in the last 168 hours. CBC: Recent Labs  Lab 10/29/23 0402 10/30/23 0440 10/31/23 0435 10/31/23 1814 11/01/23 0425  WBC 8.0 10.0 13.2* 15.8* 14.5*  NEUTROABS  --   --   --   --  11.9*  HGB 11.2* 11.4* 11.5* 12.2* 11.9*  HCT 36.5* 36.3* 37.1* 38.2* 38.7*  MCV 89.5 87.9 87.5 87.8 87.0  PLT 227 344 279 203 178   Cardiac Enzymes: No results for input(s): "CKTOTAL", "CKMB", "CKMBINDEX", "TROPONINI" in the last 168 hours. BNP: Invalid input(s): "POCBNP" CBG: Recent Labs  Lab 10/31/23 1919 10/31/23 2033 10/31/23 2159 10/31/23 2358 11/01/23 0435  GLUCAP 238* 224* 215* 216* 147*   HbA1C: No results for input(s): "HGBA1C" in the last 72 hours. Urine analysis:    Component Value Date/Time   COLORURINE YELLOW 10/31/2023 1907   APPEARANCEUR CLEAR 10/31/2023 1907   APPEARANCEUR Clear 06/26/2022 1102   LABSPEC 1.024 10/31/2023 1907   PHURINE 5.0 10/31/2023 1907   GLUCOSEU 150 (A) 10/31/2023 1907   HGBUR SMALL (A) 10/31/2023 1907   HGBUR negative 02/25/2009 1257   BILIRUBINUR NEGATIVE 10/31/2023 1907   BILIRUBINUR Negative 06/26/2022 1102   KETONESUR NEGATIVE  10/31/2023 1907   PROTEINUR NEGATIVE 10/31/2023 1907   UROBILINOGEN 0.2 02/25/2009 1257   NITRITE NEGATIVE 10/31/2023 1907   LEUKOCYTESUR NEGATIVE 10/31/2023 1907   Sepsis Labs: @LABRCNTIP (procalcitonin:4,lacticidven:4) ) Recent Results (from the past 240 hour(s))  MRSA Next Gen by PCR, Nasal     Status: None   Collection Time: 10/24/23  4:00 PM   Specimen: Nasal Mucosa; Nasal Swab  Result Value Ref Range Status   MRSA by PCR Next Gen NOT DETECTED NOT DETECTED Final    Comment: (NOTE) The GeneXpert MRSA Assay (FDA approved for NASAL specimens only), is one  component of a comprehensive MRSA colonization surveillance program. It is not intended to diagnose MRSA infection nor to guide or monitor treatment for MRSA infections. Test performance is not FDA approved in patients less than 67 years old. Performed at [facility], [address] [place], [place] [address]   Culture, blood (Routine X 2) w Reflex to ID Panel     Status: None (Preliminary result)   Collection Time: 10/31/23  6:14 PM   Specimen: BLOOD  Result Value Ref Range Status   Specimen Description BLOOD BLOOD LEFT HAND  Final   Special Requests   Final    BOTTLES DRAWN AEROBIC AND ANAEROBIC Blood Culture adequate volume Performed at [facility], [address], [place], [place] [address]    Culture PENDING  Incomplete   Report Status PENDING  Incomplete  MRSA Next Gen by PCR, Nasal     Status: None   Collection Time: 10/31/23  7:07 PM   Specimen: Nasal Mucosa; Nasal Swab  Result Value Ref Range Status   MRSA by PCR Next Gen NOT DETECTED NOT DETECTED Final    Comment: (NOTE) The GeneXpert MRSA Assay (FDA approved for NASAL specimens only), is one component of a comprehensive MRSA colonization surveillance program. It is not intended to diagnose MRSA infection nor to guide or monitor treatment for MRSA infections. Test performance is not FDA approved in patients less than 26 years old. Performed at [facility], [address], [place], [place] [address]   Culture, blood (Routine X 2) w Reflex to ID Panel     Status: None (Preliminary result)   Collection Time: 10/31/23  7:09 PM   Specimen: BLOOD  Result Value Ref Range Status   Specimen Description BLOOD BLOOD LEFT ARM  Final   Special Requests   Final    BOTTLES DRAWN AEROBIC AND ANAEROBIC Blood Culture results may not be optimal due to an inadequate volume of blood received in culture bottles Performed at [facility], [address], [place], [place] [address]    Culture PENDING  Incomplete   Report Status PENDING  Incomplete     Scheduled Meds:  acetaminophen (TYLENOL) oral liquid 160 mg/5 mL  650 mg Oral Q6H   arformoterol  15 mcg Nebulization BID   bisacodyl  10 mg Oral Daily   budesonide (PULMICORT) nebulizer solution  0.5 mg Nebulization BID   busPIRone  7.5 mg Oral TID   Chlorhexidine Gluconate Cloth  6 each Topical Daily   docusate sodium  100 mg Oral BID   insulin aspart  0-20 Units Subcutaneous Q4H   insulin aspart  2 Units Subcutaneous Q4H   insulin glargine-yfgn  30 Units Subcutaneous Daily   ipratropium-albuterol  3 mL Nebulization TID   metoprolol tartrate  5 mg Intravenous Q6H   montelukast  10 mg Oral QHS   pantoprazole (PROTONIX) IV  40 mg Intravenous Q12H   polyethylene glycol  17 g Oral Daily   risperiDONE  1 mg Oral QHS   sodium chloride flush  10-40 mL Intracatheter Q12H   sucralfate  1 g Oral TID WC & HS   tamsulosin  0.4 mg Oral QPC supper   Continuous Infusions:  sodium chloride 100 mL/hr at 11/01/23 0658   dextrose 5 % and 0.9 % NaCl 20 mL/hr at 11/01/23 0658   heparin Stopped (11/01/23 0057)   piperacillin-tazobactam (ZOSYN)  IV 12.5 mL/hr at 11/01/23 8469    Procedures/Studies: CT ABDOMEN PELVIS W CONTRAST  Result Date: 10/31/2023 CLINICAL DATA:  Peritonitis or perforation suspected. EXAM: CT ABDOMEN AND PELVIS WITH CONTRAST TECHNIQUE: Multidetector CT imaging of the abdomen and pelvis was  performed using the standard protocol following bolus administration of intravenous contrast. RADIATION DOSE REDUCTION: This exam was performed according to the departmental dose-optimization program which includes automated exposure control, adjustment of the mA and/or kV according to patient size and/or use of iterative reconstruction technique. CONTRAST:  OMNIPAQUE IOHEXOL 300 MG/ML  SOLN COMPARISON:  CT chest abdomen and pelvis 10/24/2023 FINDINGS: Lower chest: There is atelectasis in the lung bases. Hepatobiliary: No focal liver abnormality is seen. The gallbladder is now dilated and contains multiple gallstones. There is a new small focus of air in the gallbladder. Findings concerning for complex dilated common bile duct. There is inflammatory stranding surrounding the common bile duct. Pancreas: Unremarkable. No pancreatic ductal dilatation or surrounding inflammatory changes. Spleen: Normal in size without focal abnormality. Adrenals/Urinary Tract: There is a small fat containing right adrenal nodule compatible with myelolipoma measuring 7 mm. The left adrenal gland is within normal limits. There are bilateral renal cysts. The largest is in the superior pole the right kidney measuring 4.8 cm. Mildly hyperdense lesion in the inferior pole of the left kidney measures 2.5 cm and appears unchanged. There is no hydronephrosis or perinephric fat stranding. Tiny bladder calculus is seen lying in the dependent portion of the bladder. Stomach/Bowel: There is wall thickening of the distal esophagus. There is a new gastroduodenal anastomosis near the greater curvature of the stomach. The stomach is markedly dilated with large air-fluid level as seen on prior. Heterogeneous masslike density seen in the region of the first and second portion of the duodenal measuring 5.3 x 4.5 by 5.9 cm with some new central fluid areas of attenuation/low-density. There is surrounding inflammatory stranding. There is a new small  amount of free air throughout the upper abdomen. Remaining small bowel and colon are within normal limits. Oral contrast reaches the rectum. There are few scattered sigmoid colon diverticula. The appendix is not seen. Vascular/Lymphatic: Aortic atherosclerosis. No enlarged abdominal or pelvic lymph nodes. Reproductive: Prostate gland is enlarged. Other: Small fat containing umbilical hernia. There is no ascites. Small amount of free air throughout the upper abdomen. No evidence for abscess. Musculoskeletal: The bones are osteopenic. Degenerative changes affect the spine. IMPRESSION: 1. New small amount of free air throughout the upper abdomen may be related to recent abdominal surgery versus perforated viscus. 2. New gastroduodenal anastomosis. 3. Heterogeneous masslike density in the region of the first and second portion of the duodenum with some new central fluid areas of attenuation. Findings may represent hematoma, mass and/or infection. 4. New dilated gallbladder with multiple gallstones. There is a new small focus of air in the gallbladder. Findings are concerning for cholecystitis with possible emphysematous cholecystitis. Findings may also be related to direct connection of gallbladder with duodenum. Surgical consultation recommended. 5. Complex dilated common bile duct with inflammatory stranding surrounding the common bile duct. Findings may be related to choledocholithiasis. 6. Stable indeterminate 2.5 cm left renal lesion. This can be further evaluated with MRI. 7. Stable marked dilatation of the stomach with large air-fluid level. 8. Stable wall thickening of the distal esophagus. Aortic Atherosclerosis (ICD10-I70.0) and Emphysema (ICD10-J43.9). These results were called by telephone at the time of interpretation on 10/31/2023 at 10:47 pm to provider OLADAPO ADEFESO , who verbally acknowledged these results. Electronically Signed   By: Darliss Cheney M.D.   On: 10/31/2023 22:48   CT HEAD WO CONTRAST  (  )  Result Date: 10/31/2023 CLINICAL DATA:  Initial evaluation for altered mental status. EXAM: CT HEAD WITHOUT CONTRAST TECHNIQUE: Contiguous axial images were obtained from the base of the skull through the vertex without intravenous contrast. RADIATION DOSE REDUCTION: This exam was performed according to the departmental dose-optimization program which includes automated exposure control, adjustment of the mA and/or kV according to patient size and/or use of iterative reconstruction technique. COMPARISON:  Prior CT from 10/30/2015. FINDINGS: Brain: Age-related cerebral atrophy with moderately advanced chronic microvascular ischemic disease. No acute intracranial hemorrhage. 2.3 cm hypodensity seen involving the superior right insula/right frontal operculum, age indeterminate, but could reflect an evolving acute ischemic infarct (series 2, image 17). No other acute large vessel territory infarct. No mass lesion or midline shift. No hydrocephalus or extra-axial fluid collection. Vascular: No abnormal hyperdense vessel. Calcified atherosclerosis present at the skull base. Skull: Scalp soft tissues demonstrate no acute finding. Calvarium intact. Sinuses/Orbits: Globes orbital soft tissues within normal limits. Paranasal sinuses and mastoid air cells are clear. Other: None. IMPRESSION: 1. 2.3 cm hypodensity involving the superior right insula/right frontal operculum, age indeterminate, but could reflect an evolving acute ischemic infarct. Correlation with dedicated MRI suggested for further evaluation. 2. No other acute intracranial abnormality. 3. Age-related cerebral atrophy with moderately advanced chronic microvascular ischemic disease. Electronically Signed   By: Rise Mu M.D.   On: 10/31/2023 22:13   DG Chest Port 1 View  Result Date: 10/31/2023 CLINICAL DATA:  Respiratory distress.  Recent abdominal surgery. EXAM: PORTABLE CHEST 1 VIEW COMPARISON:  Abdominal x-ray 10/30/2023. FINDINGS:  Right upper extremity PICC terminates over the SVC. The lungs are clear. There is no pleural effusion or pneumothorax. The cardiomediastinal silhouette is within normal limits. There is a small amount of free air in the upper abdomen. IMPRESSION: 1. No active disease in the chest. 2. Small amount of free air in the upper abdomen, likely related to recent abdominal surgery. Please correlate clinically. If there is high clinical concern for acute pathology, recommend CT. Electronically Signed   By: Darliss Cheney M.D.   On: 10/31/2023 19:56   DG Abd 1 View  Result Date: 10/30/2023 CLINICAL DATA:  Status post bypass gastrojejunostomy. EXAM: ABDOMEN - 1 VIEW COMPARISON:  Upper GI examination on 10/28/2020 and abdominal radiograph on 10/30/2023 at 1248 hours FINDINGS: Upper abdomen is only partially imaged. There is retained contrast in the stomach. Nasogastric tube is partially imaged. There is contrast within the pelvis and right side of the abdomen. There may be a small amount of contrast in the right colon. IMPRESSION: 1. Residual contrast in the stomach. Stomach is only partially imaged on this examination. 2. Contrast in small bowel loops and probably right colon. Electronically Signed   By: Richarda Overlie M.D.   On: 10/30/2023 16:59   DG Abd Portable 1V  Result Date: 10/30/2023 CLINICAL DATA:  Nasogastric tube placement EXAM: PORTABLE ABDOMEN - 1 VIEW COMPARISON:  10/25/2023.  Upper GI from yesterday. FINDINGS: Enteric tube tip reaches the stomach where there is opacification from contrast. Limited coverage of the upper abdomen shows no dilated bowel. Clear lung bases. IMPRESSION: Enteric tube with tip reaching the stomach where there is opacification by contrast. Electronically Signed   By: Tiburcio Pea M.D.   On: 10/30/2023 05:41   DG UGI W SINGLE CM (SOL OR THIN BA)  Result Date: 10/29/2023 CLINICAL DATA:  History of gastric outlet obstruction, post surgical creation of a gastrojejunostomy. Please  perform targeted upper GI series to  evaluate patency of the gastrojejunostomy as well as to exclude the presence of a leak. EXAM: DG UGI W SINGLE CM TECHNIQUE: Single contrast examination was then performed using Gastrografin. FLUOROSCOPY: 3 minutes (58.8 mGy) COMPARISON:  CT abdomen and pelvis-10/24/2023 FINDINGS: Patient was placed supine on the fluoroscopy table Preprocedural spot fluoroscopic and radiographic images were obtained of the upper abdomen demonstrating enteric tube tip and side port project over the expected location of the gastric antrum/duodenal bulb. Note is made of a midline surgical clips. Contrast was injected via the nasogastric tube with opacification of the gastric antrum and duodenal bulb, ultimately with faint opacification of the descending portion of the duodenum. The nasogastric tube was retracted to the level of the mid body of the stomach, regional to the expected location of the surgically created gastrojejunostomy. Despite the administration of additional contrast, obtaining delayed imaging and positioning of the fluoroscopy table at an approximately 45 degree angle, there is no definitive passage of contrast from the markedly dilated stomach through the gastrojejunostomy. Nasogastric tube was secured at the nasal brim after re-advancing the tube with tip and side port ultimately terminating within mid body of the stomach. IMPRESSION: No definitive opacification of surgically created gastrojejunostomy, potentially technique related due to persistent marked gastric distention. Above findings discussed with referring surgeon, Dr. Robyne Peers, at the time of procedure completion. Electronically Signed   By: Simonne Come M.D.   On: 10/29/2023 16:00   ECHOCARDIOGRAM COMPLETE  Result Date: 10/27/2023    ECHOCARDIOGRAM REPORT   Patient Name:   MERCED GURRIERI Date of Exam: 10/27/2023 Medical Rec #:  [id number]      Height:       72.0 in Accession #:    [id number]     Weight:       188.1 lb  Date of Birth:  13-Nov-1951      BSA:          2.076 m Patient Age:    72 years       BP:           166/92 mmHg Patient Gender: M              HR:           90 bpm. [facility] Procedure: 2D Echo, Cardiac Doppler, Color Doppler and Intracardiac            Opacification Agent Indications:    Pulmonary embolus  History:        Patient has no prior history of Echocardiogram examinations.                 CKD3 and COPD; Risk Factors:Current Smoker, Hypertension and                 Dyslipidemia.  Sonographer:    Dondra Prader RVT RCS Referring Phys: [id number] CATHERINE A PAPPAYLIOU  Sonographer Comments: Technically challenging study due to limited acoustic windows, suboptimal subcostal window and suboptimal apical window. Image acquisition challenging due to respiratory motion, unable to breath hold and Image acquisition challenging due to COPD. IMPRESSIONS  1. Technically difficult study.  2. Left ventricular ejection fraction, by estimation, is >75%. The left ventricle has normal function. There is moderate left ventricular hypertrophy. Left ventricular diastolic parameters were normal.  3. Right ventricular is not well-seen. Right ventricular size grossly normal, systolic function visually preserved.  4. Right atrial size was visually normal.  5. The mitral valve is grossly normal. No evidence of mitral valve  regurgitation. No evidence of mitral stenosis.  6. The aortic valve is tricuspid. Aortic valve regurgitation is not visualized. No aortic stenosis is present.  7. There is dilatation of the ascending aorta, measuring 41 mm.  8. The inferior vena cava is dilated in size with >50% respiratory variability, suggesting right atrial pressure of 8 mmHg. Comparison(s): No prior Echocardiogram. FINDINGS  Left Ventricle: Left ventricular ejection fraction, by estimation, is >75%. The left ventricle has normal function. Definity contrast agent was given IV to delineate the left ventricular endocardial borders. The  left ventricular internal cavity size was  small. There is moderate left ventricular hypertrophy. Left ventricular diastolic parameters were normal. Right Ventricle: Right ventricular is not well-seen. Right ventricular size grossly normal, systolic function visually preserved. Left Atrium: Left atrial size was normal in size. Right Atrium: Right atrial size was visually normal. Pericardium: There is no evidence of pericardial effusion. Mitral Valve: The mitral valve is grossly normal. No evidence of mitral valve regurgitation. No evidence of mitral valve stenosis. Tricuspid Valve: The tricuspid valve is normal in structure. Tricuspid valve regurgitation is not demonstrated. No evidence of tricuspid stenosis. Aortic Valve: The aortic valve is tricuspid. Aortic valve regurgitation is not visualized. No aortic stenosis is present. Aortic valve mean gradient measures 3.0 mmHg. Aortic valve peak gradient measures 5.9 mmHg. Aortic valve area, by VTI measures 2.87 cm. Pulmonic Valve: The pulmonic valve was normal in structure. Pulmonic valve regurgitation is not visualized. No evidence of pulmonic stenosis. Aorta: The aortic root is normal in size and structure. There is dilatation of the ascending aorta, measuring 41 mm. Venous: The inferior vena cava is dilated in size with greater than 50% respiratory variability, suggesting right atrial pressure of 8 mmHg. IAS/Shunts: The interatrial septum was not well visualized.  LEFT VENTRICLE PLAX 2D LVIDd:         3.70 cm   Diastology LVIDs:         2.10 cm   LV e' medial:    3.57 cm/s LV PW:         1.60 cm   LV E/e' medial:  12.3 LV IVS:        1.30 cm   LV e' lateral:   4.14 cm/s LVOT diam:     2.10 cm   LV E/e' lateral: 10.6 LV SV:         53 LV SV Index:   25 LVOT Area:     3.46 cm  RIGHT VENTRICLE             IVC RV S prime:     11.40 cm/s  IVC diam: 2.10 cm LEFT ATRIUM             Index LA diam:        3.30 cm 1.59 cm/m LA Vol (A2C):   36.6 ml 17.63 ml/m LA Vol (A4C):    37.6 ml 18.12 ml/m LA Biplane Vol: 39.6 ml 19.08 ml/m  AORTIC VALVE                    PULMONIC VALVE AV Area (Vmax):    3.14 cm     PV Vmax:       0.70 m/s AV Area (Vmean):   2.99 cm     PV Peak grad:  2.0 mmHg AV Area (VTI):     2.87 cm AV Vmax:           121.00 cm/s AV Vmean:  77.033 cm/s AV VTI:            0.183 m AV Peak Grad:      5.9 mmHg AV Mean Grad:      3.0 mmHg LVOT Vmax:         109.67 cm/s LVOT Vmean:        66.533 cm/s LVOT VTI:          0.152 m LVOT/AV VTI ratio: 0.83  AORTA Ao Root diam: 3.70 cm Ao Asc diam:  4.10 cm MITRAL VALVE MV Area (PHT): 3.77 cm     SHUNTS MV Decel Time: 201 msec     Systemic VTI:  0.15 m MV E velocity: 44.00 cm/s   Systemic Diam: 2.10 cm MV A velocity: 110.00 cm/s MV E/A ratio:  0.40 Sunit Tolia Electronically signed by Tessa Lerner Signature Date/Time: 10/27/2023/2:57:03 PM    Final    US Venous Img Lower Bilateral (DVT)  Result Date: 10/27/2023 CLINICAL DATA:  Acute pulmonary embolus. Tobacco use. Hypoxia and hospital. Respiratory failure acute. History of diabetes, obesity, COPD. Concern for DVT. EXAM: BILATERAL LOWER EXTREMITY VENOUS DOPPLER ULTRASOUND TECHNIQUE: Gray-scale sonography with graded compression, as well as color Doppler and duplex ultrasound were performed to evaluate the lower extremity deep venous systems from the level of the common femoral vein and including the common femoral, femoral, profunda femoral, popliteal and calf veins including the posterior tibial, peroneal and gastrocnemius veins when visible. The superficial great saphenous vein was also interrogated. Spectral Doppler was utilized to evaluate flow at rest and with distal augmentation maneuvers in the common femoral, femoral and popliteal veins. COMPARISON:  None Available. FINDINGS: RIGHT LOWER EXTREMITY Common Femoral Vein: No evidence of thrombus. Normal compressibility, respiratory phasicity and response to augmentation. Saphenofemoral Junction: No evidence of  thrombus. Normal compressibility and flow on color Doppler imaging. Profunda Femoral Vein: No evidence of thrombus. Normal compressibility and flow on color Doppler imaging. Femoral Vein: No evidence of thrombus. Normal compressibility, respiratory phasicity and response to augmentation. Popliteal Vein: No evidence of thrombus. Normal compressibility, respiratory phasicity and response to augmentation. Calf Veins: No evidence of thrombus. Normal compressibility and flow on color Doppler imaging. Superficial Great Saphenous Vein: No evidence of thrombus. Normal compressibility. Venous Reflux:  None. Other Findings:  None. LEFT LOWER EXTREMITY Common Femoral Vein: No evidence of thrombus. Normal compressibility, respiratory phasicity and response to augmentation. Saphenofemoral Junction: No evidence of thrombus. Normal compressibility and flow on color Doppler imaging. Profunda Femoral Vein: No evidence of thrombus. Normal compressibility and flow on color Doppler imaging. Femoral Vein: No evidence of thrombus. Normal compressibility, respiratory phasicity and response to augmentation. Popliteal Vein: No evidence of thrombus. Normal compressibility, respiratory phasicity and response to augmentation. Calf Veins: No evidence of thrombus. Normal compressibility and flow on color Doppler imaging. Superficial Great Saphenous Vein: No evidence of thrombus. Normal compressibility. Venous Reflux:  None. Other Findings:  None. IMPRESSION: No evidence of deep venous thrombosis in either lower extremity. Electronically Signed   By: Tish Frederickson M.D.   On: 10/27/2023 01:41   DG Abd Portable 1V  Result Date: 10/25/2023 CLINICAL DATA:  NG placement EXAM: PORTABLE ABDOMEN - 1 VIEW COMPARISON:  Abdominal radiograph dated 10/25/2023. FINDINGS: Interval progression of the enteric tube with tip and side-port in the left upper abdomen likely in the proximal stomach. IMPRESSION: Enteric tube with tip and side-port in the proximal  stomach. Electronically Signed   By: Elgie Collard M.D.   On: 10/25/2023 18:15   [place]  EKG SITE RITE  Result Date: 10/25/2023 If Site Rite image not attached, placement could not be confirmed due to current cardiac rhythm.  DG Abd Portable 1V  Result Date: 10/25/2023 CLINICAL DATA:  NG tube placement EXAM: PORTABLE ABDOMEN - 1 VIEW COMPARISON:  KUB 2 days prior, CT abdomen/pelvis 1 day prior FINDINGS: The enteric catheter tip is in the stomach and sidehole is in the distal esophagus. There is a nonobstructive bowel gas pattern. There is no definite free intraperitoneal air. There is no abnormal soft tissue calcification. Contrast is noted in the bladder. There is no acute osseous abnormality. IMPRESSION: Enteric catheter sidehole in the distal esophagus. Recommend advancement by approximately 8 cm. These results will be called to the ordering clinician or representative by the Radiologist Assistant, and communication documented in the PACS or [employer]. Electronically Signed   By: Lesia Hausen M.D.   On: 10/25/2023 13:34   DG CHEST PORT 1 VIEW  Result Date: 10/24/2023 CLINICAL DATA:  Intubation. EXAM: PORTABLE CHEST 1 VIEW COMPARISON:  10/23/2023. FINDINGS: The heart size and mediastinal contours are stable. There is atherosclerotic calcification of the aorta. Mild airspace disease is noted at the left lung base. No effusion or pneumothorax is seen. An endotracheal tube terminates 4.4 cm above the carina. The side port of an enteric tube is above the level of the diaphragm and should be advanced 14 cm. IMPRESSION: 1. Mild airspace disease at the left lung base, likely atelectasis. 2. Endotracheal tube terminates 4.4 cm above the carina. 3. The enteric tube terminates above the level of the diaphragm and should be advanced 14 cm. Electronically Signed   By: Thornell Sartorius M.D.   On: 10/24/2023 20:14   CT Angio Chest/Abd/Pel for Dissection W and/or W/WO  Result Date: 10/24/2023 CLINICAL DATA:   Arterial embolism suspected, non-extremity, determine source rule out chest PE, rule out GI ischemia per Dr. Jena Gauss EXAM: CT ANGIOGRAPHY CHEST, ABDOMEN AND PELVIS TECHNIQUE: Non-contrast CT of the chest was initially obtained. Multidetector CT imaging through the chest, abdomen and pelvis was performed using the standard protocol during bolus administration of intravenous contrast. Multiplanar reconstructed images and MIPs were obtained and reviewed to evaluate the vascular anatomy. RADIATION DOSE REDUCTION: This exam was performed according to the departmental dose-optimization program which includes automated exposure control, adjustment of the mA and/or kV according to patient size and/or use of iterative reconstruction technique. CONTRAST:  75mL OMNIPAQUE IOHEXOL 350 MG/ML SOLN COMPARISON:  10/23/2023, 09/30/2023 FINDINGS: CTA CHEST FINDINGS Cardiovascular: Heart is borderline in size. Scattered coronary artery and aortic calcifications. 4 cm ascending thoracic aortic aneurysm. No dissection. Pulmonary arteries are not adequately opacified to evaluate for pulmonary emboli on this aortic/dissection study. Mediastinum/Nodes: No mediastinal, hilar, or axillary adenopathy. Endotracheal tube in the mid to lower trachea. Thyroid and esophagus unremarkable. Lungs/Pleura: Small left pleural effusion, slightly increased since prior study. Increasing left basilar atelectasis or infiltrate. Musculoskeletal: Chest wall soft tissues are unremarkable. No acute bony abnormality. Review of the MIP images confirms the above findings. CTA ABDOMEN AND PELVIS FINDINGS VASCULAR Aorta: Aortic atherosclerosis.  No aneurysm or dissection. Celiac: Patent without evidence of aneurysm, dissection, vasculitis or significant stenosis. SMA: Patent without evidence of aneurysm, dissection, vasculitis or significant stenosis. Renals: Single left renal artery and 2 right renal arteries. No evidence of stenosis. Scattered calcifications. IMA:  Patent Inflow: Moderate calcifications through the iliofemoral vessels. No aneurysm, dissection or significant stenosis. Veins: No obvious venous abnormality within the limitations of this arterial phase study. Review  of the MIP images confirms the above findings. NON-VASCULAR Hepatobiliary: No focal hepatic abnormality. Gallbladder unremarkable. Pancreas: No focal abnormality or ductal dilatation. Spleen: No focal abnormality.  Normal size. Adrenals/Urinary Tract: No adrenal nodules. Bilateral renal cysts appear benign with no additional follow-up recommended. No stones or hydronephrosis. Small layering mobile stones in the left side of the urinary bladder. Stomach/Bowel: Marked distention of the stomach again noted. Duodenal bulb is distended with fluid as well. There appears to be circumferential wall thickening within the descending duodenum with surrounding inflammation/stranding. Overall, this appears slightly worsened since prior study. Remainder of small bowel decompressed. Colonic diverticulosis. No active diverticulitis. Lymphatic: No adenopathy Reproductive: Prostate enlargement. Other: No free fluid or free air. Musculoskeletal: No acute bony abnormality. Review of the MIP images confirms the above findings. IMPRESSION: No evidence of aortic aneurysm or dissection. Aortic atherosclerosis. Mesenteric vessels appear widely patent. Unable to assess/evaluate for pulmonary emboli given the bolus timing to adequately opacify and evaluate the aorta. Coronary artery disease. Small left pleural effusion with left basilar atelectasis or infiltrate, increasing since prior study. Marked distention of the stomach with fluid and air. This appears to be due to wall thickening and surrounding inflammation in the region of the descending duodenum. This could be related to severe duodenitis or possibly duodenal wall hematoma. Findings concerning for gastric outlet obstruction. The patient may benefit from NG tube  decompression. Electronically Signed   By: Charlett Nose M.D.   On: 10/24/2023 19:22   DG Abdomen 1 View  Result Date: 10/23/2023 CLINICAL DATA:  Nasogastric tube placement EXAM: ABDOMEN - 1 VIEW COMPARISON:  None Available. FINDINGS: The nasogastric tube is looped within the distal esophagus with its tip within the distal esophagus overlying T7. The abdominal gas pattern is nonspecific due to a paucity of intra-abdominal gas. Contrast is seen within a nondilated renal collecting system bilaterally. IMPRESSION: 1. Nasogastric tube looped within the distal esophagus with its tip within the distal esophagus overlying T7. Repositioning is recommended. Electronically Signed   By: Helyn Numbers M.D.   On: 10/23/2023 18:16   US ABDOMEN LIMITED WITH LIVER DOPPLER  Result Date: 10/23/2023 CLINICAL DATA:  Portal vein thrombus EXAM: DUPLEX ULTRASOUND OF LIVER TECHNIQUE: Color and duplex Doppler ultrasound was performed to evaluate the hepatic in-flow and out-flow vessels. COMPARISON:  CT 10/23/2023 FINDINGS: Liver: Normal parenchymal echogenicity. Normal hepatic contour without nodularity. No focal lesion, mass or intrahepatic biliary ductal dilatation. Limited images of the gallbladder are unremarkable. Main Portal Vein size: 20 mm cm Portal Vein Velocities Main Prox:  69 cm/sec Main Mid: 48 cm/sec Main Dist:  40 cm/sec Right: 29 cm/sec Left: 29 cm/sec Flow within the main, right, and left portal veins is hepatopetal (antegrade) Hepatic Vein Velocities Right:  81 cm/sec Middle:  33 cm/sec Left:  36 cm/sec Flow within hepatic veins demonstrates appropriate directional flow (hepatofugal) IVC: Present and patent with normal respiratory phasicity. Hepatic Artery Velocity:  95 cm/sec Splenic Vein Velocity:  39 cm/sec Spleen: 10.3 cm x 9.6 cm x 3.7 cm with a total volume of 191 cm^3 (411 cm^3 is upper limit normal) Portal Vein Occlusion/Thrombus: Not identified. Focal thrombus noted on prior CT examination is not  visualized on the current exam. Splenic Vein Occlusion/Thrombus: No Ascites: None Varices: None IMPRESSION: 1. Normal hepatic Doppler examination. No evidence of portal vein thrombosis. Electronically Signed   By: Helyn Numbers M.D.   On: 10/23/2023 18:14   CT ABDOMEN PELVIS W CONTRAST  Result Date: 10/23/2023 CLINICAL DATA:  Several day history of abdominal pain and emesis EXAM: CT ABDOMEN AND PELVIS WITH CONTRAST TECHNIQUE: Multidetector CT imaging of the abdomen and pelvis was performed using the standard protocol following bolus administration of intravenous contrast. RADIATION DOSE REDUCTION: This exam was performed according to the departmental dose-optimization program which includes automated exposure control, adjustment of the mA and/or kV according to patient size and/or use of iterative reconstruction technique. CONTRAST:  OMNIPAQUE IOHEXOL 300 MG/ML  SOLN COMPARISON:  CT abdomen and pelvis dated 09/30/2023 FINDINGS: Lower chest: No focal consolidation or pulmonary nodule in the lung bases. Pleural effusion. Partially imaged heart size is normal. Coronary artery calcifications. Partially imaged hypodensity within a right lower lobe pulmonary artery (2:1). Hepatobiliary: No focal hepatic lesions. No intra or extrahepatic biliary ductal dilation. Normal gallbladder. Pancreas: Mild peripheral hypodensity involving the pancreatic head and neck adjacent to the duodenal inflammation. No focal lesions or main ductal dilation. Spleen: Normal in size without focal abnormality. Adrenals/Urinary Tract: No adrenal nodules. Bilateral renal cysts, a few of which are minimally complicated. No specific follow-up imaging recommended. No hydronephrosis or calculi. Mobile stones within the urinary bladder. Stomach/Bowel: Circumferential mural thickening of the distal esophagus. Markedly distended stomach to the level of the proximal duodenum where there is again circumferential mural thickening and heterogeneous  hypoattenuation no evidence of bowel wall thickening, distention, or inflammatory changes. Colonic diverticulosis without acute diverticulitis. Normal appendix. Vascular/Lymphatic: Aortic atherosclerosis. New focal hypoattenuation within the upstream portal vein (2:26). No enlarged abdominal or pelvic lymph nodes. Reproductive: Markedly enlarged prostate gland. Other: No free fluid, fluid collection, or free air. Musculoskeletal: No acute or abnormal lytic or blastic osseous lesions. Multilevel degenerative changes of the partially imaged thoracic and lumbar spine. Small fat-containing paraumbilical hernia. IMPRESSION: 1. Markedly distended stomach to the level of the proximal duodenum where there is again circumferential mural thickening and heterogeneous hypoattenuation at the site of previously noted duodenitis. Findings suspicious for gastric outlet obstruction. 2. New focal hypoattenuation within the upstream portal vein, suspicious for nonocclusive thrombosis. 3. Partially imaged hypodensity within a right lower lobe pulmonary artery may be artifactual or represent a pulmonary embolism. 4. Mild peripheral hypodensity involving the pancreatic head and neck adjacent to the duodenal inflammation, which may be reactive. 5. Circumferential mural thickening of the distal esophagus, which may be due to esophagitis. 6. Markedly enlarged prostate gland. 7. Mobile stones within the urinary bladder. 8.  Aortic Atherosclerosis (ICD10-I70.0). Critical Value/emergent results were called by telephone at the time of interpretation on 10/23/2023 at 1:51 pm to provider [facility], who verbally acknowledged these results. Electronically Signed   By: Agustin Cree M.D.   On: 10/23/2023 13:55   DG Chest Portable 1 View  Result Date: 10/23/2023 CLINICAL DATA:  Weakness. EXAM: PORTABLE CHEST 1 VIEW COMPARISON:  December 08, 2022. FINDINGS: The heart size and mediastinal contours are within normal limits. Both lungs are clear. The  visualized skeletal structures are unremarkable. IMPRESSION: No active disease. Electronically Signed   By: Lupita Raider M.D.   On: 10/23/2023 12:11    Catarina Hartshorn, DO  [facility]  If 7PM-7AM, please contact night-coverage [email or web address] Password [email or web address] 11/01/2023, 7:26 AM   LOS: 9 days

## 2023-11-01 NOTE — Progress Notes (Addendum)
Pt intubated at 10:59 AM. Transfer orders noted for [facility]. Gave report to [place]. [facility] transporting patient.  Intubation as follows: 10:57 Ketamine 25 mg 10:57 Etimidate 10, 10:57 Succinylcholine 100. ET tube at the lip. Pt tolerated well.

## 2023-11-02 ENCOUNTER — Inpatient Hospital Stay (HOSPITAL_COMMUNITY): Payer: 59

## 2023-11-02 DIAGNOSIS — I639 Cerebral infarction, unspecified: Secondary | ICD-10-CM

## 2023-11-02 DIAGNOSIS — I81 Portal vein thrombosis: Secondary | ICD-10-CM | POA: Diagnosis not present

## 2023-11-02 LAB — CBC
HCT: 34.7 % — ABNORMAL LOW (ref 39.0–52.0)
Hemoglobin: 10.8 g/dL — ABNORMAL LOW (ref 13.0–17.0)
MCH: 27.7 pg (ref 26.0–34.0)
MCHC: 31.1 g/dL (ref 30.0–36.0)
MCV: 89 fL (ref 80.0–100.0)
Platelets: 90 10*3/uL — ABNORMAL LOW (ref 150–400)
RBC: 3.9 MIL/uL — ABNORMAL LOW (ref 4.22–5.81)
RDW: 17 % — ABNORMAL HIGH (ref 11.5–15.5)
WBC: 17.4 10*3/uL — ABNORMAL HIGH (ref 4.0–10.5)
nRBC: 0.5 % — ABNORMAL HIGH (ref 0.0–0.2)

## 2023-11-02 LAB — COMPREHENSIVE METABOLIC PANEL
ALT: 47 U/L — ABNORMAL HIGH (ref 0–44)
AST: 37 U/L (ref 15–41)
Albumin: 2.1 g/dL — ABNORMAL LOW (ref 3.5–5.0)
Alkaline Phosphatase: 28 U/L — ABNORMAL LOW (ref 38–126)
Anion gap: 15 (ref 5–15)
BUN: 89 mg/dL — ABNORMAL HIGH (ref 8–23)
CO2: 17 mmol/L — ABNORMAL LOW (ref 22–32)
Calcium: 10.2 mg/dL (ref 8.9–10.3)
Chloride: 101 mmol/L (ref 98–111)
Creatinine, Ser: 3.28 mg/dL — ABNORMAL HIGH (ref 0.61–1.24)
GFR, Estimated: 19 mL/min — ABNORMAL LOW (ref >60)
Glucose, Bld: 241 mg/dL — ABNORMAL HIGH (ref 70–99)
Potassium: 5.5 mmol/L — ABNORMAL HIGH (ref 3.5–5.1)
Sodium: 133 mmol/L — ABNORMAL LOW (ref 135–145)
Total Bilirubin: 1.2 mg/dL — ABNORMAL HIGH (ref <1.2)
Total Protein: 5.9 g/dL — ABNORMAL LOW (ref 6.5–8.1)

## 2023-11-02 LAB — GLUCOSE, CAPILLARY
Glucose-Capillary: 156 mg/dL — ABNORMAL HIGH (ref 70–99)
Glucose-Capillary: 202 mg/dL — ABNORMAL HIGH (ref 70–99)
Glucose-Capillary: 237 mg/dL — ABNORMAL HIGH (ref 70–99)
Glucose-Capillary: 266 mg/dL — ABNORMAL HIGH (ref 70–99)
Glucose-Capillary: 268 mg/dL — ABNORMAL HIGH (ref 70–99)
Glucose-Capillary: 261 mg/dL — ABNORMAL HIGH (ref 70–99)
Glucose-Capillary: 208 mg/dL — ABNORMAL HIGH (ref 70–99)
Glucose-Capillary: 291 mg/dL — ABNORMAL HIGH (ref 70–99)

## 2023-11-02 LAB — BASIC METABOLIC PANEL
Anion gap: 13 (ref 5–15)
BUN: 92 mg/dL — ABNORMAL HIGH (ref 8–23)
CO2: 18 mmol/L — ABNORMAL LOW (ref 22–32)
Calcium: 9.4 mg/dL (ref 8.9–10.3)
Chloride: 100 mmol/L (ref 98–111)
Creatinine, Ser: 2.51 mg/dL — ABNORMAL HIGH (ref 0.61–1.24)
GFR, Estimated: 26 mL/min — ABNORMAL LOW (ref >60)
Glucose, Bld: 289 mg/dL — ABNORMAL HIGH (ref 70–99)
Potassium: 3.8 mmol/L (ref 3.5–5.1)
Sodium: 131 mmol/L — ABNORMAL LOW (ref 135–145)

## 2023-11-02 LAB — POCT I-STAT 7, (LYTES, BLD GAS, ICA,H+H)
Acid-base deficit: 8 mmol/L — ABNORMAL HIGH (ref 0.0–2.0)
Bicarbonate: 16.7 mmol/L — ABNORMAL LOW (ref 20.0–28.0)
Calcium, Ion: 1.36 mmol/L (ref 1.15–1.40)
HCT: 32 % — ABNORMAL LOW (ref 39.0–52.0)
Hemoglobin: 10.9 g/dL — ABNORMAL LOW (ref 13.0–17.0)
O2 Saturation: 100 %
Potassium: 4.8 mmol/L (ref 3.5–5.1)
Sodium: 132 mmol/L — ABNORMAL LOW (ref 135–145)
TCO2: 18 mmol/L — ABNORMAL LOW (ref 22–32)
pCO2 arterial: 32.4 mm[Hg] (ref 32–48)
pH, Arterial: 7.32 — ABNORMAL LOW (ref 7.35–7.45)
pO2, Arterial: 299 mm[Hg] — ABNORMAL HIGH (ref 83–108)

## 2023-11-02 LAB — TRIGLYCERIDES: Triglycerides: 133 mg/dL (ref <150)

## 2023-11-02 LAB — PARATHYROID HORMONE, INTACT (NO CA): PTH: 10 pg/mL — ABNORMAL LOW (ref 15–65)

## 2023-11-02 LAB — PHOSPHORUS: Phosphorus: 6.7 mg/dL — ABNORMAL HIGH (ref 2.5–4.6)

## 2023-11-02 LAB — HEPARIN LEVEL (UNFRACTIONATED)
Heparin Unfractionated: 0.97 [IU]/mL — ABNORMAL HIGH (ref 0.30–0.70)
Heparin Unfractionated: 0.63 [IU]/mL (ref 0.30–0.70)

## 2023-11-02 LAB — CALCITRIOL (1,25 DI-OH VIT D): Vit D, 1,25-Dihydroxy: 28.2 pg/mL (ref 24.8–81.5)

## 2023-11-02 LAB — MAGNESIUM: Magnesium: 2.5 mg/dL — ABNORMAL HIGH (ref 1.7–2.4)

## 2023-11-02 MED ORDER — FAT EMUL FISH OIL/PLANT BASED 20% (SMOFLIPID)IV EMUL
250.0000 mL | INTRAVENOUS | Status: AC
  Administered 2023-11-02: 250 mL via INTRAVENOUS
  Filled 2023-11-02: qty 250

## 2023-11-02 MED ORDER — DEXMEDETOMIDINE HCL IN NACL 400 MCG/100ML IV SOLN
0.0000 ug/kg/h | INTRAVENOUS | Status: DC
Start: 2023-11-02 — End: 2023-11-03
  Administered 2023-11-02 (×2): 0.4 ug/kg/h via INTRAVENOUS
  Filled 2023-11-02 (×2): qty 100

## 2023-11-02 MED ORDER — STERILE WATER FOR INJECTION IJ SOLN
INTRAMUSCULAR | Status: AC
  Filled 2023-11-02: qty 10

## 2023-11-02 MED ORDER — INSULIN GLARGINE-YFGN 100 UNIT/ML ~~LOC~~ SOLN
40.0000 [IU] | Freq: Every day | SUBCUTANEOUS | Status: DC
  Administered 2023-11-02: 40 [IU] via SUBCUTANEOUS
  Filled 2023-11-02 (×2): qty 0.4

## 2023-11-02 MED ORDER — ALTEPLASE 2 MG IJ SOLR
2.0000 mg | Freq: Once | INTRAMUSCULAR | Status: AC
Start: 2023-11-02 — End: 2023-11-02
  Administered 2023-11-02: 2 mg
  Filled 2023-11-02: qty 2

## 2023-11-02 MED ORDER — ACETAMINOPHEN 10 MG/ML IV SOLN
1000.0000 mg | Freq: Four times a day (QID) | INTRAVENOUS | Status: AC
  Administered 2023-11-02 – 2023-11-03 (×4): 1000 mg via INTRAVENOUS
  Filled 2023-11-02 (×4): qty 100

## 2023-11-02 MED ORDER — INSULIN ASPART 100 UNIT/ML IJ SOLN
0.0000 [IU] | INTRAMUSCULAR | Status: DC
  Administered 2023-11-02 (×2): 11 [IU] via SUBCUTANEOUS
  Administered 2023-11-02: 7 [IU] via SUBCUTANEOUS
  Administered 2023-11-02 – 2023-11-03 (×2): 11 [IU] via SUBCUTANEOUS
  Administered 2023-11-03 – 2023-11-04 (×6): 7 [IU] via SUBCUTANEOUS
  Administered 2023-11-04: 4 [IU] via SUBCUTANEOUS
  Administered 2023-11-04 – 2023-11-05 (×4): 7 [IU] via SUBCUTANEOUS
  Administered 2023-11-05: 4 [IU] via SUBCUTANEOUS
  Administered 2023-11-05: 11 [IU] via SUBCUTANEOUS
  Administered 2023-11-05: 4 [IU] via SUBCUTANEOUS
  Administered 2023-11-05: 11 [IU] via SUBCUTANEOUS
  Administered 2023-11-05 (×2): 7 [IU] via SUBCUTANEOUS
  Administered 2023-11-06 (×2): 4 [IU] via SUBCUTANEOUS
  Administered 2023-11-06: 7 [IU] via SUBCUTANEOUS
  Administered 2023-11-06: 4 [IU] via SUBCUTANEOUS
  Administered 2023-11-06 – 2023-11-07 (×4): 7 [IU] via SUBCUTANEOUS
  Administered 2023-11-07: 4 [IU] via SUBCUTANEOUS
  Administered 2023-11-07: 7 [IU] via SUBCUTANEOUS
  Administered 2023-11-07 – 2023-11-08 (×2): 4 [IU] via SUBCUTANEOUS
  Administered 2023-11-08 (×2): 7 [IU] via SUBCUTANEOUS

## 2023-11-02 MED ORDER — ALTEPLASE 2 MG IJ SOLR
2.0000 mg | Freq: Once | INTRAMUSCULAR | Status: DC
  Filled 2023-11-02: qty 2

## 2023-11-02 MED ORDER — HEPARIN (PORCINE) 25000 UT/250ML-% IV SOLN
1550.0000 [IU]/h | INTRAVENOUS | Status: DC
  Administered 2023-11-03 (×2): 1550 [IU]/h via INTRAVENOUS
  Filled 2023-11-02 (×2): qty 250

## 2023-11-02 MED ORDER — VANCOMYCIN VARIABLE DOSE PER UNSTABLE RENAL FUNCTION (PHARMACIST DOSING): Status: DC

## 2023-11-02 MED ORDER — TRACE MINERALS CU-MN-SE-ZN 300-55-60-3000 MCG/ML IV SOLN
INTRAVENOUS | Status: AC
  Filled 2023-11-02: qty 2000

## 2023-11-02 NOTE — Procedures (Signed)
Central Venous Catheter Insertion Procedure Note  Matthew Molina  [id number]  27-Dec-1950  Date:11/02/23  Time:3:14 PM   Provider Performing:Keiera Strathman A Wynona Neat   Procedure: Insertion of Non-tunneled Central Venous Catheter(36556)   Indication(s) Medication administration  Consent Risks of the procedure as well as the alternatives and risks of each were explained to the patient and/or caregiver.  Consent for the procedure was obtained and is signed in the bedside chart  Anesthesia Topical only with 1% lidocaine   Timeout Verified patient identification, verified procedure, site/side was marked, verified correct patient position, special equipment/implants available, medications/allergies/relevant history reviewed, required imaging and test results available.  Sterile Technique Maximal sterile technique including full sterile barrier drape, hand hygiene, sterile gown, sterile gloves, mask, hair covering, sterile ultrasound probe cover (if used).  Procedure Description Area of catheter insertion was cleaned with chlorhexidine and draped in sterile fashion.  With real-time ultrasound guidance a central venous catheter was placed into the right internal jugular vein. Nonpulsatile blood flow and easy flushing noted in all ports.  The catheter was sutured in place and sterile dressing applied.  Complications/Tolerance None; patient tolerated the procedure well. Chest X-ray is ordered to verify placement for internal jugular or subclavian cannulation.   Chest x-ray is not ordered for femoral cannulation.  EBL Minimal  Specimen(s) None

## 2023-11-02 NOTE — Progress Notes (Signed)
NAME:  Matthew Molina, MRN:  [id number], DOB:  [birth year], LOS: 10 ADMISSION DATE:  10/23/2023, CONSULTATION DATE:  11/14 REFERRING MD:  Dr. Arbutus Leas, CHIEF COMPLAINT:  septic shock   History of Present Illness:  Patient is a 72 yo M w/ pertinent PMH IgG MGUS, COPD, DMT2, HTN, HLD presents to [facility] on 11/5 w/ abd pain, N/V.  Patient recently admitted to hospital on 10/13 w/ N/V and abd pain. Treated for aspiration pna. GI performed EGD showing grade B esophagitis, duodenitis, and gastric ulcer nonbleeding. Discharged 10/16 w/ PPI and sucralfate.   Patient began having gradual worsening abd pain over the past week. Also having some N/V and decreased po intake. Generalized weakness. Denies sob, diarrhea, chest pain. Came to [facility] on 11/5. On arrival hemodynamically stable and sats 92% on room air. Lipase 84. CT abd/pelvis w/ contrast marked distention of the stomach to the level of the duodenum where there is circumferential thickening and heterogenous hypoattenuation at the site of the patient's previous duodenitis; new hypodensity upstream portal vein suspicious for nonocclusive thrombus and partially imaged hypodensity in the right upper lobe pulmonary artery which may represent artifact versus PE. Started on heparin. GI and surgery consulted. Patient underwent gastrojejunostomy for concern of gastric outlet obstruction secondary to PUD/duodenitis on 10/26/2023. Patient started on TPN for short period and stopped 11/13.   Overnight on 11/3 patient with increased respiratory distress and AMS. Having some abd pain. CT head 2.3 cm hypodensity involving the superior right insula/right frontal operculum, age indeterminate, but could reflect an evolving acute ischemic infarct.  CT abd/pelvis w/ contrast showing small amount of free air throughout abd; new dilated gallbladder w/ multiple gallstones concerning for cholecystits. Surgery consulted and no intervention at this time. Patient w/ increasing AMS and required  intubation. Patient also w/ hypotension requiring levophed. Plan to transfer to [facility] and PCCM consulted.  Pertinent  Medical History   Past Medical History:  Diagnosis Date   Acute blood loss anemia 01/05/2020   Acute respiratory failure with hypoxia (HCC)    Arthritis    COPD (chronic obstructive pulmonary disease) (HCC)    Depression    Diabetes mellitus    Diabetes mellitus without complication (HCC)    Diverticulitis    Elevated PSA 10/01/2016   Encounter for support and coordination of transition of care 01/14/2020   Hypercholesterolemia    Hyperlipidemia    Hypertension    Insomnia 01/05/2016   Ischemic colitis (HCC) 01/05/2020   Multiple lung nodules on CT 04/02/2015   Nicotine addiction    Obesity    Oxygen deficiency    qhs   Schizophrenia (HCC)    Significant Hospital Events: Including procedures, antibiotic start and stop dates in addition to other pertinent events   11/5 admit to [facility] 11/14 transferred to [facility] for respiratory failure and septic shock. Intubated on levo. Pccm consulted  Interim History / Subjective:  On propofol, fentanyl Requiring vasopressin and norepinephrine Unresponsive Cough, gag, corneals present  Objective   Blood pressure (!) 105/53, pulse (!) 105, temperature 98.3 F (36.8 C), temperature source Axillary, resp. rate (!) 26, height 6' (1.829 m), weight 86.4 kg, SpO2 98%.    Vent Mode: PRVC FiO2 (%):  [50 %-100 %] 50 % Set Rate:  [26 bmp] 26 bmp Vt Set:  [620 mL] 620 mL PEEP:  [5 cmH20] 5 cmH20 Plateau Pressure:  [12 cmH20-18 cmH20] 16 cmH20   Intake/Output Summary (Last 24 hours) at 11/02/2023 1515 Last data filed at 11/02/2023 1500  Gross per 24 hour  Intake 6047.66 ml  Output 2420 ml  Net 3627.66 ml   Filed Weights   10/31/23 1951 11/01/23 1321 11/02/23 0500  Weight: 82.2 kg 83.5 kg 86.4 kg    Examination: General: Critically ill-appearing, on mechanical ventilator HEENT: Moist oral mucosa, endotracheal tube in  place Neuro: Pupils are equal, reacting CV: S1-S2 appreciated PULM: Clear breath sounds GI: Soft, bowel sounds appreciated Extremities: warm/dry, no edema  Skin: no rashes or lesions   I reviewed nursing notes, Consultant notes, last 24 h vitals and pain scores, last 48 h intake and output, last 24 h labs and trends, and last 24 h imaging results.  Resolved Hospital Problem list     Assessment & Plan:   Acute hypoxemic respiratory failure Possible aspiration Continue full ventilator support -Continue mechanical ventilation -Target TVol 6-8cc/kgIBW -Target Plateau Pressure < 30cm H20 -Target driving pressure less than 15 cm of water -Target PaO2 55-65: titrate PEEP/FiO2 per protocol -Ventilator associated pneumonia prevention protocol  For possible pneumonia continue Zosyn and vancomycin  History of obstructive lung disease -Continue Brovana, continue Pulmicort and DuoNeb  Septic shock Continue pressors including Levophed and vasopressin to maintain MAP greater than 65 -Stress dose steroids- Continue bicarb drip and wean off   Severe distal duodenal obstruction s/p gastrojejunostomy 11/8 Cholelithiasis -Surgery continues to follow -Will continue current antibiotic therapy -Continue PPI  Acute metabolic encephalopathy Likely related to sepsis Concern for stroke -Trying to stabilize for MRI -MRI already ordered  Portal vein thrombosis Concern for PE -Continue heparin  Chronic kidney disease stage IIIa Electrolyte derangement including hypokalemia, hypocalcemia-being repleted -Trend electrolytes -Ensure renal perfusion by maintaining MAP goal -Avoid nephrotoxic medications  Hypertension Hyperlipidemia -Hold antihypertensives at present -Continue statin  Depression/anxiety Home meds on hold  Updated daughter on the phone today-Dinetta Richard   Best Practice (right click and "Reselect all SmartList Selections" daily)   Diet/type: NPO DVT prophylaxis:  systemic heparin GI prophylaxis: PPI Lines: N/A Foley:  N/A Code Status:  full code Last date of multidisciplinary goals of care discussion [11/14 updated daughter over phone]  Labs   CBC: Recent Labs  Lab 10/31/23 0435 10/31/23 1814 11/01/23 0425 11/01/23 1407 11/01/23 1425 11/02/23 0339 11/02/23 0734  WBC 13.2* 15.8* 14.5*  --  9.1 17.4*  --   NEUTROABS  --   --  11.9*  --   --   --   --   HGB 11.5* 12.2* 11.9* 12.9* 9.7* 10.8* 10.9*  HCT 37.1* 38.2* 38.7* 38.0* 33.5* 34.7* 32.0*  MCV 87.5 87.8 87.0  --  96.0 89.0  --   PLT 279 203 178  --  83* 90*  --     Basic Metabolic Panel: Recent Labs  Lab 10/28/23 0338 10/28/23 1349 10/29/23 0402 10/30/23 0440 10/31/23 0435 10/31/23 1814 11/01/23 0029 11/01/23 0425 11/01/23 1407 11/01/23 1515 11/01/23 2000 11/02/23 0339 11/02/23 0734  NA 133*   < > 132* 132* 130*   < > 133* 135 135 116* 134* 133* 132*  K 2.7*   < > 3.9 3.6 4.0   < > 3.9 3.8 5.2* 3.3* 4.8 5.5* 4.8  CL 101   < > 104 102 108   < > 104 104  --  88* 104 101  --   CO2 24   < > 18* 18* 15*   < > 18* 17*  --  14* 17* 17*  --   GLUCOSE 216*   < > 235* 316* 285*   < >  228* 155*  --  603* 152* 241*  --   BUN 29*   < > 37* 50* 72*   < > 81* 81*  --  66* 93* 89*  --   CREATININE 0.98   < > 1.00 1.18 1.36*   < > 1.55* 1.52*  --  1.93* 2.80* 3.28*  --   CALCIUM 9.5   < > 10.1 11.0* 11.1*   < > 11.7* 11.9*  --  7.4* 10.5* 10.2  --   MG 2.1  --  2.1  --  2.3  --   --  2.2  --   --   --  2.5*  --   PHOS  --   --  2.2* 3.4 3.4  --   --  4.1  --   --   --  6.7*  --    < > = values in this interval not displayed.   GFR: Estimated Creatinine Clearance: 22 mL/min (A) (by C-G formula based on SCr of 3.28 mg/dL (H)). Recent Labs  Lab 10/31/23 0435 10/31/23 1715 10/31/23 1814 11/01/23 0425 11/01/23 0801 11/01/23 0900 11/01/23 1131 11/01/23 1425 11/01/23 1617 11/02/23 0339  PROCALCITON  --  0.24  --   --  1.42  --   --   --   --   --   WBC  --   --  15.8* 14.5*  --    --   --  9.1  --  17.4*  LATICACIDVEN   < >  --  1.7  --   --  2.5* 2.0* 1.7 3.7*  --    < > = values in this interval not displayed.    Liver Function Tests: Recent Labs  Lab 10/29/23 0402 10/31/23 0435 11/01/23 0425 11/01/23 1515 11/02/23 0339  AST 16 32 60* 31 37  ALT 12 26 65* 39 47*  ALKPHOS 34* 40 48 24* 28*  BILITOT 0.5 0.5 0.9 3.2* 1.2*  PROT 6.8 7.3 7.5 4.1* 5.9*  ALBUMIN 3.0* 3.2* 3.2* 1.6* 2.1*   Recent Labs  Lab 11/01/23 1425  LIPASE 105*   Recent Labs  Lab 10/31/23 1715  AMMONIA 32    ABG    Component Value Date/Time   PHART 7.320 (L) 11/02/2023 0734   PCO2ART 32.4 11/02/2023 0734   PO2ART 299 (H) 11/02/2023 0734   HCO3 16.7 (L) 11/02/2023 0734   TCO2 18 (L) 11/02/2023 0734   ACIDBASEDEF 8.0 (H) 11/02/2023 0734   O2SAT 100 11/02/2023 0734     Coagulation Profile: No results for input(s): "INR", "PROTIME" in the last 168 hours.  Cardiac Enzymes: No results for input(s): "CKTOTAL", "CKMB", "CKMBINDEX", "TROPONINI" in the last 168 hours.  HbA1C: HbA1c, POC (controlled diabetic range)  Date/Time Value Ref Range Status  08/09/2022 10:26 AM 7.0 0.0 - 7.0 % Final   Hgb A1c MFr Bld  Date/Time Value Ref Range Status  11/01/2023 04:25 AM 6.0 (H) 4.8 - 5.6 % Final    Comment:    (NOTE) Pre diabetes:          5.7%-6.4%  Diabetes:              >6.4%  Glycemic control for   <7.0% adults with diabetes   10/12/2023 04:07 PM 6.0 (H) 4.8 - 5.6 % Final    Comment:             Prediabetes: 5.7 - 6.4          Diabetes: >6.4  Glycemic control for adults with diabetes: <7.0     CBG: Recent Labs  Lab 11/01/23 2338 11/02/23 0337 11/02/23 0742 11/02/23 1125 11/02/23 1301  GLUCAP 193* 202* 237* 266* 268*    Review of Systems:   Patient is encephalopathic and/or intubated; therefore, history has been obtained from chart review.    Past Medical History:  He,  has a past medical history of Acute blood loss anemia (01/05/2020), Acute  respiratory failure with hypoxia (HCC), Arthritis, COPD (chronic obstructive pulmonary disease) (HCC), Depression, Diabetes mellitus, Diabetes mellitus without complication (HCC), Diverticulitis, Elevated PSA (10/01/2016), Encounter for support and coordination of transition of care (01/14/2020), Hypercholesterolemia, Hyperlipidemia, Hypertension, Insomnia (01/05/2016), Ischemic colitis (HCC) (01/05/2020), Multiple lung nodules on CT (04/02/2015), Nicotine addiction, Obesity, Oxygen deficiency, and Schizophrenia (HCC).   Surgical History:   Past Surgical History:  Procedure Laterality Date   BIOPSY  05/28/2023   Procedure: BIOPSY;  Surgeon: Lanelle Bal, DO;  [facility];  Service: Endoscopy;;   BIOPSY  10/02/2023   Procedure: BIOPSY;  Surgeon: Dolores Frame, MD;  [facility]: [facility];  Service: Gastroenterology;;   BIOPSY  10/24/2023   Procedure: BIOPSY;  Surgeon: Corbin Ade, MD;  [facility]: [facility];  Service: Endoscopy;;   CATARACT EXTRACTION W/PHACO Left 11/20/2013   Procedure: CATARACT EXTRACTION PHACO AND INTRAOCULAR LENS PLACEMENT (IOC);  Surgeon: Gemma Payor, MD;  Location: [facility];  Service: Ophthalmology;  Laterality: Left;  CDE:10.26   CATARACT EXTRACTION W/PHACO Right 12/08/2013   Procedure: RIGHT EYE CATARACT EXTRACTION PHACO AND INTRAOCULAR LENS PLACEMENT ;  Surgeon: Gemma Payor, MD;  Location: [facility];  Service: Ophthalmology;  Laterality: Right;  CDE 12.38   COLONOSCOPY N/A 04/01/2013   pancolonic diverticulosis, redundant colon, large internal hemorrhoids.    COLONOSCOPY  2014   INCOMPLETE PREP IN R COLON   COLONOSCOPY N/A 04/01/2020   Procedure: COLONOSCOPY;  Surgeon: West Bali, MD;  [facility];  Service: Endoscopy;  Laterality: N/A;  8:45am   ESOPHAGOGASTRODUODENOSCOPY N/A 02/18/2016   stricture at GE junction, moderate erosive gastritis and mild non-erosive duodenitis. +H.pylori. Treated initially with Prevpac. Breath  test to check for eradication was positive, and he was prescribed Pylera.    ESOPHAGOGASTRODUODENOSCOPY (EGD) WITH PROPOFOL N/A 05/28/2023   Procedure: ESOPHAGOGASTRODUODENOSCOPY (EGD) WITH PROPOFOL;  Surgeon: Lanelle Bal, DO;  [facility];  Service: Endoscopy;  Laterality: N/A;  11:15am, asa 3   ESOPHAGOGASTRODUODENOSCOPY (EGD) WITH PROPOFOL N/A 10/02/2023   Procedure: ESOPHAGOGASTRODUODENOSCOPY (EGD) WITH PROPOFOL;  Surgeon: Dolores Frame, MD;  [facility];  Service: Gastroenterology;  Laterality: N/A;   ESOPHAGOGASTRODUODENOSCOPY (EGD) WITH PROPOFOL N/A 10/24/2023   Procedure: ESOPHAGOGASTRODUODENOSCOPY (EGD) WITH PROPOFOL;  Surgeon: Corbin Ade, MD;  [facility];  Service: Endoscopy;  Laterality: N/A;   EYE SURGERY Left 11/2013   cataract extraction   GASTROJEJUNOSTOMY N/A 10/26/2023   Procedure: GASTROJEJUNOSTOMY;  Surgeon: Lewie Chamber, DO;  Location: [facility];  Service: General;  Laterality: N/A;   POLYPECTOMY  04/01/2020   Procedure: POLYPECTOMY;  Surgeon: West Bali, MD;  [facility];  Service: Endoscopy;;   SAVORY DILATION N/A 02/18/2016   Procedure: SAVORY DILATION;  Surgeon: West Bali, MD;  [facility];  Service: Endoscopy;  Laterality: N/A;   SHOULDER SURGERY Right      Social History:   reports that he has been smoking cigarettes. He has a 48 pack-year smoking history. He has never used smokeless  tobacco. He reports current alcohol use. He reports that he does not use drugs.   Family History:  His family history includes Diabetes in his mother and sister; Drug abuse in his brother; Heart disease in his sister; Hypertension in his mother; Kidney disease in his father; Stroke in his mother. There is no history of Colon cancer or Colon polyps.   Allergies Allergies  Allergen Reactions   Asa [Aspirin] Other (See Comments)    Stomach pain   Crestor [Rosuvastatin] Other (See  Comments)    Stomach pain   Morphine Nausea And Vomiting   Wellbutrin [Bupropion] Other (See Comments)    Stomach pain   Zoloft [Sertraline Hcl] Other (See Comments)    Stomach pain    The patient is critically ill with multiple organ systems failure and requires high complexity decision making for assessment and support, frequent evaluation and titration of therapies, application of advanced monitoring technologies and extensive interpretation of multiple databases. Critical Care Time devoted to patient care services described in this note independent of APP/resident time (if applicable)  is 36 minutes.   Virl Diamond MD [facility] Personal pager: See Amion If unanswered, please page CCM On-call: #[phone]

## 2023-11-02 NOTE — Progress Notes (Signed)
Nutrition Follow-up  DOCUMENTATION CODES:   Severe malnutrition in context of chronic illness  INTERVENTION:   Resume TPN with orders per Pharmacy. Will likely be unable to meet 100% of nutrition needs d/t national IVF shortage and use of premixed Clinimix.  When able, transition to enteral nutrition with Vital 1.5 at 20 ml/h, increase by 10 ml every 4 hours to goal rate of 65 ml/h with Prosource TF20 60 ml BID (2500 kcal, 145 gm protein, 1192 ml free water daily.   NUTRITION DIAGNOSIS:   Severe Malnutrition related to chronic illness (GOO) as evidenced by severe muscle depletion, severe fat depletion, percent weight loss (8% weight loss x 3 months).  Ongoing   GOAL:   Patient will meet greater than or equal to 90% of their needs  Progressing  MONITOR:   I & O's, Diet advancement  REASON FOR ASSESSMENT:   Consult New TPN/TNA  ASSESSMENT:   72 yo male admitted with portal vein thrombus, functional GOO with intractable vomiting. PMH includes COPD, DM-2, HTN, HLD, depression, tobacco abuse, CAD, diverticulitis, ischemic colitis, schizophrenia.  11/08: gastrojejunostomy for GOO d/t PUD stricture/duodenitis & portal vein thrombosis. 11/13: CT abd showed open and fluid-filled anastomosis with no evidence of leak.   Patient required intubation and was transferred to [facility] 11/14.  Currently intubated on ventilator support. MV: 15.1 L/min Temp (24hrs), Avg:98.1 F (36.7 C), Min:97.6 F (36.4 C), Max:98.6 F (37 C)  Propofol: 2.5 ml/h providing 66 kcal from lipid. IVF: D5 at 20 ml/h; LR at 50 ml/h  UOP 620 ml x 24 hours NG output 2,900 ml x 24 hours  TPN initiated 11/9, stopped 11/13. TPN was stopped on 11/13 d/t hypercalcemia per discussion with Pharmacist today. Discussed with CCM MD and Pharmacist; plans to resume TPN today. Per RN one of the lines of the PICC is clogged; IV team to evaluate.  Previously received Clinimix E 8/10 via PICC at 84 ml/h x 24 hours and 20%  Smoflipid at 20 ml/h x 12 hours to provide 1820 kcal, 160 gm protein daily. This meets 79% of minimum estimated kcal needs and > 100% of estimated protein needs.    Admit weight 88 kg 11/5 Current weight 86.4 kg  Labs reviewed. Na 132, BUN 89, creat 3.28, phos 6.7, mag 2.5, trig 133, A1C 6 CBG: 202-237  Medications reviewed and include solu-cortef, novolog, protonix, fentanyl, levophed, sodium bicarb, vasopressin, propofol.   Diet Order:   Diet Order             Diet NPO time specified  Diet effective now                   EDUCATION NEEDS:   Not appropriate for education at this time  Skin:  Skin Assessment: Reviewed RN Assessment  Last BM:  11/14 type 6  Height:   Ht Readings from Last 1 Encounters:  10/29/23 6' (1.829 m)    Weight:   Wt Readings from Last 1 Encounters:  11/02/23 86.4 kg    Ideal Body Weight:  80.9 kg  BMI:  Body mass index is 25.83 kg/m.  Estimated Nutritional Needs:   Kcal:  2300-2600  Protein:  130-145 gm  Fluid:  2.3-2.6 L   Gabriel Rainwater RD, LDN, CNSC Please refer to Amion for contact information.

## 2023-11-02 NOTE — Progress Notes (Signed)
Pt transported to and from MRI without issues.

## 2023-11-02 NOTE — Consult Note (Signed)
NEUROLOGY CONSULT NOTE   Date of service: November 02, 2023 Patient Name: Matthew Molina MRN:  [id number] DOB:  06-Apr-1951 Chief Complaint: "strokes on MRI" Requesting Provider: Martina Sinner, MD  History of Present Illness  Matthew Molina is a 72 y.o. male  has a past medical history of Acute blood loss anemia (01/05/2020), Acute respiratory failure with hypoxia (HCC), Arthritis, COPD (chronic obstructive pulmonary disease) (HCC), Depression, Diabetes mellitus, Diabetes mellitus without complication (HCC), Diverticulitis, Elevated PSA (10/01/2016), Encounter for support and coordination of transition of care (01/14/2020), Hypercholesterolemia, Hyperlipidemia, Hypertension, Insomnia (01/05/2016), Ischemic colitis (HCC) (01/05/2020), Multiple lung nodules on CT (04/02/2015), Nicotine addiction, Obesity, Oxygen deficiency, and Schizophrenia (HCC).  Admitted to the hospital from [facility] where he presented with abdominal pain nausea and vomiting. He started to have gradual worsening of his abdominal pain over the past week and some nausea vomiting and decreased p.o. intake as well as generalized weakness and presented to [facility] on 10/23/2023.  CT abdomen pelvis with marked distention of stomach at the level of the duodenum and new hypodensity upstream portal vein suspicious for nonocclusive thrombus and partially imaged hypodensity in the right upper lobe pulmonary artery which was artifact versus PE.  Started on heparin.  GI and surgery consulted.  Underwent gastrojejunostomy for concern of gastric outlet obstruction secondary to PUD/duodenitis on 10/26/2023.  Started on TPN.  On 1113, had increased respiratory distress and AMS.  Worsening abdominal pain.  CT head with a hypodensity in the superior right insula but could reflect air throughout the abdomen, new dilated gallbladder with multiple gallstones concerning for cholecystitis.  Surgery was consulted with no intervention.   Patient had increasing her mental status and required intubation.  Also had extensive hypotension requiring Levophed and was transferred to [facility]. MRI was completed that showed multifocal infarcts with the largest 1 being in the right cerebellum concerning for a central etiology. Neurology consulted. Patient unable to provide any history at this time-intubated, on sedation with Precedex and fentanyl.  LKW: Unclear Modified rankin score: Unclear/unable to ascertain IV Thrombolysis: Currently on heparin drip and unclear last known well EVT: Last known well   ROS  Comprehensive ROS and to be performed due to patient's mentation  Past History   Past Medical History:  Diagnosis Date   Acute blood loss anemia 01/05/2020   Acute respiratory failure with hypoxia (HCC)    Arthritis    COPD (chronic obstructive pulmonary disease) (HCC)    Depression    Diabetes mellitus    Diabetes mellitus without complication (HCC)    Diverticulitis    Elevated PSA 10/01/2016   Encounter for support and coordination of transition of care 01/14/2020   Hypercholesterolemia    Hyperlipidemia    Hypertension    Insomnia 01/05/2016   Ischemic colitis (HCC) 01/05/2020   Multiple lung nodules on CT 04/02/2015   Nicotine addiction    Obesity    Oxygen deficiency    qhs   Schizophrenia [facility])     Past Surgical History:  Procedure Laterality Date   BIOPSY  05/28/2023   Procedure: BIOPSY;  Surgeon: Lanelle Bal, DO;  [facility]: [facility];  Service: Endoscopy;;   BIOPSY  10/02/2023   Procedure: BIOPSY;  Surgeon: Dolores Frame, MD;  Location: [facility];  Service: Gastroenterology;;   BIOPSY  10/24/2023   Procedure: BIOPSY;  Surgeon: Corbin Ade, MD;  Location: [facility];  Service: Endoscopy;;   CATARACT EXTRACTION W/PHACO  Left 11/20/2013   Procedure: CATARACT EXTRACTION PHACO AND INTRAOCULAR LENS PLACEMENT (IOC);  Surgeon: Gemma Payor, MD;  Location: [facility];   Service: Ophthalmology;  Laterality: Left;  CDE:10.26   CATARACT EXTRACTION W/PHACO Right 12/08/2013   Procedure: RIGHT EYE CATARACT EXTRACTION PHACO AND INTRAOCULAR LENS PLACEMENT ;  Surgeon: Gemma Payor, MD;  Location: [facility];  Service: Ophthalmology;  Laterality: Right;  CDE 12.38   COLONOSCOPY N/A 04/01/2013   pancolonic diverticulosis, redundant colon, large internal hemorrhoids.    COLONOSCOPY  2014   INCOMPLETE PREP IN R COLON   COLONOSCOPY N/A 04/01/2020   Procedure: COLONOSCOPY;  Surgeon: West Bali, MD;  Location: [facility];  Service: Endoscopy;  Laterality: N/A;  8:45am   ESOPHAGOGASTRODUODENOSCOPY N/A 02/18/2016   stricture at GE junction, moderate erosive gastritis and mild non-erosive duodenitis. +H.pylori. Treated initially with Prevpac. Breath test to check for eradication was positive, and he was prescribed Pylera.    ESOPHAGOGASTRODUODENOSCOPY (EGD) WITH PROPOFOL N/A 05/28/2023   Procedure: ESOPHAGOGASTRODUODENOSCOPY (EGD) WITH PROPOFOL;  Surgeon: Lanelle Bal, DO;  [facility];  Service: Endoscopy;  Laterality: N/A;  11:15am, asa 3   ESOPHAGOGASTRODUODENOSCOPY (EGD) WITH PROPOFOL N/A 10/02/2023   Procedure: ESOPHAGOGASTRODUODENOSCOPY (EGD) WITH PROPOFOL;  Surgeon: Dolores Frame, MD;  [facility];  Service: Gastroenterology;  Laterality: N/A;   ESOPHAGOGASTRODUODENOSCOPY (EGD) WITH PROPOFOL N/A 10/24/2023   Procedure: ESOPHAGOGASTRODUODENOSCOPY (EGD) WITH PROPOFOL;  Surgeon: Corbin Ade, MD;  [facility];  Service: Endoscopy;  Laterality: N/A;   EYE SURGERY Left 11/2013   cataract extraction   GASTROJEJUNOSTOMY N/A 10/26/2023   Procedure: GASTROJEJUNOSTOMY;  Surgeon: Lewie Chamber, DO;  Location: [facility];  Service: General;  Laterality: N/A;   POLYPECTOMY  04/01/2020   Procedure: POLYPECTOMY;  Surgeon: West Bali, MD;  [facility];  Service: Endoscopy;;   SAVORY DILATION N/A 02/18/2016    Procedure: SAVORY DILATION;  Surgeon: West Bali, MD;  [facility];  Service: Endoscopy;  Laterality: N/A;   SHOULDER SURGERY Right     Family History: Family History  Problem Relation Age of Onset   Diabetes Mother    Hypertension Mother    Stroke Mother    Diabetes Sister    Heart disease Sister    Kidney disease Father    Drug abuse Brother    Colon cancer Neg Hx    Colon polyps Neg Hx     Social History  reports that he has been smoking cigarettes. He has a 48 pack-year smoking history. He has never used smokeless tobacco. He reports current alcohol use. He reports that he does not use drugs.  Allergies  Allergen Reactions   Asa [Aspirin] Other (See Comments)    Stomach pain   Crestor [Rosuvastatin] Other (See Comments)    Stomach pain   Morphine Nausea And Vomiting   Wellbutrin [Bupropion] Other (See Comments)    Stomach pain   Zoloft [Sertraline Hcl] Other (See Comments)    Stomach pain    Medications   Current Facility-Administered Medications:    acetaminophen (OFIRMEV) IV 1,000 mg, 1,000 mg, Intravenous, Q6H, Meuth, Brooke A, PA-C, Last Rate: 400 mL/hr at 11/02/23 1935, 1,000 mg at 11/02/23 1935   alteplase (CATHFLO ACTIVASE) injection 2 mg, 2 mg, Intracatheter, Once, Martina Sinner, MD   arformoterol (BROVANA) nebulizer solution 15 mcg, 15 mcg, Nebulization, BID, Pappayliou, Catherine A, DO, 15 mcg at 11/02/23 1947   bisacodyl (DULCOLAX) suppository 10 mg, 10  mg, Rectal, Daily, Doristine Counter, RPH, 10 mg at 11/02/23 1029   budesonide (PULMICORT) nebulizer solution 0.5 mg, 0.5 mg, Nebulization, BID, Pappayliou, Catherine A, DO, 0.5 mg at 11/02/23 1951   Chlorhexidine Gluconate Cloth 2 % PADS 6 each, 6 each, Topical, Daily, Pappayliou, Catherine A, DO, 6 each at 11/02/23 1019   dexmedetomidine (PRECEDEX) 400 MCG/100ML (4 mcg/mL) infusion, 0-1.2 mcg/kg/hr, Intravenous, Titrated, Olalere, Adewale A, MD, Paused at 11/02/23 1736   TPN (CLINIMIX)  Adult without lytes, , Intravenous, Continuous TPN, Last Rate: 82 mL/hr at 11/02/23 1847, New Bag at 11/02/23 1847 **AND** fat emulsion 20 % (SMOFLIPID) infusion, 250 mL, Intravenous, Continuous TPN, Doristine Counter, RPH, Last Rate: 20.8 mL/hr at 11/02/23 1847, 250 mL at 11/02/23 1847   fentaNYL (SUBLIMAZE) bolus via infusion 25-100 mcg, 25-100 mcg, Intravenous, Q15 min PRN, Martina Sinner, MD, 50 mcg at 11/02/23 1716   fentaNYL (SUBLIMAZE) injection 25 mcg, 25 mcg, Intravenous, Q15 min PRN, Pia Mau D, PA-C   fentaNYL (SUBLIMAZE) injection 25-100 mcg, 25-100 mcg, Intravenous, Q30 min PRN, Lidia Collum, PA-C, 50 mcg at 11/02/23 1535   fentaNYL in NS (73mcg/ml) infusion-PREMIX, 25-200 mcg/hr, Intravenous, Continuous, Martina Sinner, MD, Paused at 11/02/23 1735   heparin ADULT infusion 100 units/mL (25000 units/214mL), 1,750 Units/hr, Intravenous, Continuous, Fran, Schulenberg, RPH, Stopped at 11/02/23 1705   hydrocortisone sodium succinate (SOLU-CORTEF) 100 MG injection 100 mg, 100 mg, Intravenous, Q8H, Tat, Onalee Hua, MD, 100 mg at 11/02/23 1936   insulin aspart (novoLOG) injection 0-20 Units, 0-20 Units, Subcutaneous, Q4H, Doristine Counter, RPH, 7 Units at 11/02/23 1920   insulin glargine-yfgn (SEMGLEE) injection 40 Units, 40 Units, Subcutaneous, Daily, Doristine Counter, RPH, 40 Units at 11/02/23 1302   ipratropium-albuterol (DUONEB) 0.5-2.5 (3) MG/3ML nebulizer solution 3 mL, 3 mL, Nebulization, TID, Pappayliou, Catherine A, DO, 3 mL at 11/02/23 1947   ipratropium-albuterol (DUONEB) 0.5-2.5 (3) MG/3ML nebulizer solution 3 mL, 3 mL, Nebulization, Q4H PRN, Suzie Portela, John D, PA-C   norepinephrine (LEVOPHED) 16 mg in (0.064 mg/mL) premix infusion, 0-40 mcg/min, Intravenous, Titrated, Martina Sinner, MD, Paused at 11/02/23 1734   Oral care mouth rinse, 15 mL, Mouth Rinse, PRN, Pappayliou, Gustavus Messing, DO   Oral care mouth rinse, 15 mL, Mouth Rinse, Q2H, Tat, Onalee Hua, MD, 15 mL at  11/02/23 1716   Oral care mouth rinse, 15 mL, Mouth Rinse, PRN, Tat, Onalee Hua, MD   pantoprazole (PROTONIX) injection 40 mg, 40 mg, Intravenous, Q12H, Pappayliou, Catherine A, DO, 40 mg at 11/02/23 1024   piperacillin-tazobactam (ZOSYN) IVPB 3.375 g, 3.375 g, Intravenous, Q8H, Tat, Onalee Hua, MD, Stopped at 11/02/23 1700   prochlorperazine (COMPAZINE) injection 10 mg, 10 mg, Intravenous, Q6H PRN, Pappayliou, Catherine A, DO   sodium chloride flush (NS) 0.9 % injection 10-40 mL, 10-40 mL, Intracatheter, Q12H, Johnson, Clanford L, MD, 10 mL at 11/02/23 1030   sodium chloride flush (NS) 0.9 % injection 10-40 mL, 10-40 mL, Intracatheter, PRN, Johnson, Clanford L, MD   vancomycin variable dose per unstable renal function (pharmacist dosing), , Does not apply, See admin instructions, Doristine Counter, RPH   vasopressin (PITRESSIN) 20 Units in 100 mL (0.2 unit/mL) infusion-*FOR SHOCK*, 0-0.03 Units/min, Intravenous, Continuous, Lidia Collum, PA-C, Paused at 11/02/23 1737  Vitals   Vitals:   11/02/23 1819 11/02/23 1830 11/02/23 1845 11/02/23 1902  BP:  (!) 143/77 130/74   Pulse:  95 89   Resp:  16 13   Temp:    98.6  F (37 C)  TempSrc:    Axillary  SpO2: 100% 98% 99%   Weight:      Height:        Body mass index is 25.83 kg/m.  Physical Exam  General: Sickly looking man intubated sedated HEENT: Normocephalic atraumatic Lungs: Clear Cardiovascular: Regular rate rhythm, on pressors Neurological exam Sedated intubated with fentanyl and Precedex Precedex was held for the exam Does not follow commands No spontaneous movements Nonverbal Cranial nerves: Mildly disconjugate gaze with pinpoint pupils with very sluggish reaction, corneal reflexes present, does not blink to threat from either side, facial symmetry difficult to ascertain, breathing over the ventilator. Motor examination: No sponges movement, no movement to noxious simulation Sensation as above Coord  cannot be assessed given the  mentation  Labs/Imaging/Neurodiagnostic studies   CBC:  Recent Labs  Lab November 08, 2023 0425 08-Nov-2023 1407 November 08, 2023 1425 11/02/23 0339 11/02/23 0734  WBC 14.5*  --  9.1 17.4*  --   NEUTROABS 11.9*  --   --   --   --   HGB 11.9*   < > 9.7* 10.8* 10.9*  HCT 38.7*   < > 33.5* 34.7* 32.0*  MCV 87.0  --  96.0 89.0  --   PLT 178  --  83* 90*  --    < > = values in this interval not displayed.    Basic Metabolic Panel:  Lab Results  Component Value Date   NA 132 (L) 11/02/2023   K 4.8 11/02/2023   CO2 17 (L) 11/02/2023   GLUCOSE 241 (H) 11/02/2023   BUN 89 (H) 11/02/2023   CREATININE 3.28 (H) 11/02/2023   CALCIUM 10.2 11/02/2023   GFRNONAA 19 (L) 11/02/2023   GFRAA 60 01/25/2021    Lipid Panel:  Lab Results  Component Value Date   LDLCALC 35 2023/11/08    HgbA1c:  Lab Results  Component Value Date   HGBA1C 6.0 (H) 11/08/23   Alcohol Level     Component Value Date/Time   ETH <10 09/30/2023 0949    INR  Lab Results  Component Value Date   INR 1.1 03/10/2020   CT Head without contrast(Personally reviewed): 1113 2024-2.3 cm hypodensity in the superior right insula/right frontal operculum, age-indeterminate but could reflect an evolving acute infarct correlation with MRI recommended.  MRI Brain(Personally reviewed): Large acute infarct in the superior right cerebellum with small acute infarcts in the left cerebellum.  Right cerebellar infarct associated with petechial hemorrhage.  There are cortical acute infarctions in the right frontoparietal which correlates with a hypodensity seen on the CT on 10/31/2023.  Additional primary cortical acute infarcts in bilateral frontal, parietal and occipital lobes specially multiple vascular territories asymptomatic etiology suspected.  Scattered punctate foci of encephalomalacia in bilateral stable hemisphere nonspecific but concerning for CAA  2D echocardiogram was done 10/27/2023 which was a technically difficult study with EF  greater than 75%, LA size normal, interatrial septum not well-visualized.  Valves reported grossly normal   ASSESSMENT   Matthew Molina is a 72 y.o. male  has a past medical history of Acute blood loss anemia (01/05/2020), Acute respiratory failure with hypoxia (HCC), Arthritis, COPD (chronic obstructive pulmonary disease) (HCC), Depression, Diabetes mellitus, Diabetes mellitus without complication (HCC), Diverticulitis, Elevated PSA (10/01/2016), Encounter for support and coordination of transition of care (01/14/2020), Hypercholesterolemia, Hyperlipidemia, Hypertension, Insomnia (01/05/2016), Ischemic colitis (HCC) (01/05/2020), Multiple lung nodules on CT (04/02/2015), Nicotine addiction, Obesity, Oxygen deficiency, and Schizophrenia (HCC).  Currently being treated for acute hypoxemic respiratory failure,  possible aspiration, septic shock requiring pressors, severe distal duodenal obstruction status post gastrojejunostomy, portal vein thrombosis and concern for PE on heparin, CKD 3A, noted to have multifocal infarcts with the largest one being in the right cerebellum with an unclear last known well. Outside the window for any intervention Given sepsis, and hypotension, a central etiology such as hypoperfusion versus septic emboli should be entertained. He is currently on heparin already for multiple days and the largest area of the stroke has mild petechial hemorrhage, would recommend lowering the heparin to stroke protocol levels  Impression: Acute ischemic stroke-septic emboli versus hypoperfusion in the setting of sepsis  RECOMMENDATIONS  Frequent neurochecks Telemetry On heparin-will recommend lower PTT/no heparin level stroke protocol. If not necessary, consider discontinuing heparin due to the risk of petechial hemorrhage and hemorrhagic conversion in the strokes, especially given the fact that the large cerebellar infarct has already started to exhibit some petechial hemorrhage and his SWI  images have some chronic microhemorrhages indicative of CAA.  Will defer this to the morning rounding from the CCM and surgery teams with stroke team. I would recommend not doing any antiplatelets while on heparin. May have to consider TEE for endocarditis rule out A1c Lipid panel Therapy assessments when able to Supportive management of the infection and other comorbidities per primary team/surgical teams as you are. Plan was discussed with Patsy Lager, PCCM. Stroke team will follow  Signed,  Milon Dikes [facility]  CRITICAL CARE ATTESTATION Performed by: Milon Dikes, MD Total critical care time: 45 minutes Critical care time was exclusive of separately billable procedures and treating other patients and/or supervising APPs/Residents/Students Critical care was necessary to treat or prevent imminent or life-threatening deterioration. This patient is critically ill and at significant risk for neurological worsening and/or death and care requires constant monitoring. Critical care was time spent personally by me on the following activities: development of treatment plan with patient and/or surrogate as well as nursing, discussions with consultants, evaluation of patient's response to treatment, examination of patient, obtaining history from patient or surrogate, ordering and performing treatments and interventions, ordering and review of laboratory studies, ordering and review of radiographic studies, pulse oximetry, re-evaluation of patient's condition, participation in multidisciplinary rounds and medical decision making of high complexity in the care of this patient.

## 2023-11-02 NOTE — Progress Notes (Signed)
PHARMACY - TOTAL PARENTERAL NUTRITION CONSULT NOTE  Indication:  Gastric outlet obstruction  Patient Measurements: Height: 6' (182.9 cm) Weight: 86.4 kg (190 lb 7.6 oz) IBW/kg (Calculated) : 77.6 TPN AdjBW (KG): 87.2 Body mass index is 25.83 kg/m.  Assessment:  39 YOM with GOO from PUD stricture/duodenitis that is preventing him from passing any food or drink, causing abdominal distention, pain, N/V.  Underwent gastrojejunostomy on 11/8 and diet gradually advanced to full liquid on 11/13.  He was on TPN 11/8 >> 11/13.    Patient was transferred to the ICU on 11/13 due to AMS and apnea.  CT showed AIS and free air in abdomen, dilated gallbladder, possible cholecystitis and possible hematoma/mass/infection.  CCS think gallbladder is unremarkable.  Patient was transferred to [facility] on 11/14.  Pharmacy consulted to resume TPN.  Glucose / Insulin: hx DM2 - CBGs elevated Required up to 50 units insulin in TPN in addition to Semglee 30/d at [facility] Steroid initiated 11/14, insulin gtt briefly 11/14  Electrolytes:  Renal:  Hepatic: LFTs acceptable, tbili 1.2, albumin 2.1, TG WNL Intake / Output; MIVF: MIVF stops 11/8 at 1800, last Lasix 11/14 GI Imaging: 11/6 EGD: showed distal duodenal obstruction 11/11 upper GI: contrast was not moving down GJ 11/12 KUB: contrast within small bowel and colon 11/13 CT: free air in abdomen, dilated gallbladder, possible cholecystitis and possible hematoma/mass/infection 11/15 abd U/S: pending GI Surgeries / Procedures: none since TPN initiation   Central access: PICC placed 10/26/23 TPN start date: 10/26/23 >> 10/31/23, restarting 11/15 >>  Nutritional Goals: RD Estimated Needs Total Energy Estimated Needs: 2300-2600 Total Protein Estimated Needs: 120-135 gm Total Fluid Estimated Needs: 2.3-2.6 L  Current Nutrition:  TPN  Plan:  Continue Clinimix 8/10 at 82 ml/hr and SMOFlipid at 20.8 ml/hr x 12 hrs to provide 1798 kCal and 157g AA, meeting 78% of kCal  and exceeding AA goal by 16% Electrolytes in Clinimix E 1L bag: Na 35 mEq, K 30 mEq, Mag 5 mEq, Ca 5 mEq, Phos , Acetate 83, Cl 76 mEq Add standard MVI and trace elements to TPN Continue resistant SSI Q4H and Semglee 40 units daily Monitor TPN labs on Mon/Thurs - labs in AM

## 2023-11-02 NOTE — Progress Notes (Signed)
Arterial line order for RT discontinued Per Dr. Warrick Parisian. Pt will require ultrasound for arterial line placement.

## 2023-11-02 NOTE — Progress Notes (Signed)
Patient ID: Matthew Molina, male   DOB: [birth year], 72 y.o.   MRN: [id number] [facility] Surgery Progress Note  7 Days Post-Op  Subjective: CC-  Remains on vent, stable pressor requirements Levo 31 and Vaso 0.03 NG with 2.9L out WBC up 17.4, afebrile. Cr up 3.28  Objective: Vital signs in last 24 hours: Temp:  [97.3 F (36.3 C)-98.6 F (37 C)] 98.6 F (37 C) (11/15 0736) Pulse Rate:  [39-127] 107 (11/15 0815) Resp:  [20-45] 26 (11/15 0815) BP: (63-143)/(36-93) 103/66 (11/15 0800) SpO2:  [93 %-100 %] 100 % (11/15 0815) FiO2 (%):  [60 %-100 %] 60 % (11/15 0740) Weight:  [83.5 kg-86.4 kg] 86.4 kg (11/15 0500) Last BM Date : 11/01/23  Intake/Output from previous day: 11/14 0701 - 11/15 0700 In: 5450.1 [I.V.:4202; IV Piggyback:1248.1] Out: 3520 [Urine:620; Emesis/NG output:2900] Intake/Output this shift: Total I/O In: 204.9 [I.V.:192.5; IV Piggyback:12.4] Out: -   PE: General: ill appearing male on the vent HEENT: NG present with light bilious output. ETT in place  Heart: tachycardic  Lungs: mechanically ventilated Abd: soft, mild distension, no rigidity, bowel sounds present, midline incision cdi with staples present and no erythema or drainage. Tenderness across the upper abdomen with some guarding R>L Neuro: not f/c  Lab Results:  Recent Labs    11/01/23 1425 11/02/23 0339 11/02/23 0734  WBC 9.1 17.4*  --   HGB 9.7* 10.8* 10.9*  HCT 33.5* 34.7* 32.0*  PLT 83* 90*  --    BMET Recent Labs    11/01/23 2000 11/02/23 0339 11/02/23 0734  NA 134* 133* 132*  K 4.8 5.5* 4.8  CL 104 101  --   CO2 17* 17*  --   GLUCOSE 152* 241*  --   BUN 93* 89*  --   CREATININE 2.80* 3.28*  --   CALCIUM 10.5* 10.2  --    PT/INR No results for input(s): "LABPROT", "INR" in the last 72 hours. CMP     Component Value Date/Time   NA 132 (L) 11/02/2023 0734   NA 140 10/12/2023 1607   K 4.8 11/02/2023 0734   CL 101 11/02/2023 0339   CO2 17 (L) 11/02/2023 0339   GLUCOSE  241 (H) 11/02/2023 0339   BUN 89 (H) 11/02/2023 0339   BUN 15 10/12/2023 1607   CREATININE 3.28 (H) 11/02/2023 0339   CREATININE 1.38 (H) 05/26/2020 1149   CALCIUM 10.2 11/02/2023 0339   PROT 5.9 (L) 11/02/2023 0339   PROT 7.0 04/27/2023 0921   ALBUMIN 2.1 (L) 11/02/2023 0339   ALBUMIN 4.3 04/27/2023 0921   AST 37 11/02/2023 0339   ALT 47 (H) 11/02/2023 0339   ALKPHOS 28 (L) 11/02/2023 0339   BILITOT 1.2 (H) 11/02/2023 0339   BILITOT 0.4 04/27/2023 0921   GFRNONAA 19 (L) 11/02/2023 0339   GFRNONAA 51 (L) 05/26/2020 1149   GFRAA 60 01/25/2021 0850   GFRAA 60 05/26/2020 1149   Lipase     Component Value Date/Time   LIPASE 105 (H) 11/01/2023 1425       Studies/Results: DG Abd 1 View  Result Date: 11/02/2023 CLINICAL DATA:  [id number] Encounter for orogastric (OG) tube placement [id number] EXAM: ABDOMEN - 1 VIEW COMPARISON:  X-ray abdomen 11/01/2023 FINDINGS: Enteric tube courses below the hemidiaphragm with tip and side port overlying the expected region of the gastric lumen. PO contrast noted within the colon. The bowel gas pattern is normal. No radio-opaque calculi or other significant radiographic abnormality are seen. IMPRESSION: Enteric tube in  good position. Electronically Signed   By: Tish Frederickson M.D.   On: 11/02/2023 02:06   DG Chest Port 1 View  Result Date: 11/01/2023 CLINICAL DATA:  Endotracheal tube. EXAM: PORTABLE CHEST 1 VIEW COMPARISON:  Same day. FINDINGS: Endotracheal and nasogastric tubes are in grossly good position. Mild right basilar atelectasis or infiltrate is noted. Right-sided PICC line is unchanged. IMPRESSION: Endotracheal and nasogastric tubes are in grossly good position. Mild right basilar atelectasis or infiltrate. Electronically Signed   By: Lupita Raider M.D.   On: 11/01/2023 17:11   DG Abd 1 View  Result Date: 11/01/2023 CLINICAL DATA:  NG tube placement EXAM: ABDOMEN - 1 VIEW COMPARISON:  10/31/2023 FINDINGS: Limited radiograph of the lower  chest and upper abdomen was obtained for the purposes of enteric tube localization. Enteric tube is seen coursing below the diaphragm with distal tip terminating within the expected location of the gastric antrum. IMPRESSION: Enteric tube terminates within the expected location of the gastric antrum. Recommend advancement 5-10 cm. Electronically Signed   By: Duanne Guess D.O.   On: 11/01/2023 14:38   CT ABDOMEN PELVIS W CONTRAST  Addendum Date: 11/01/2023   ADDENDUM REPORT: 11/01/2023 08:10 ADDENDUM: Received a call concerning gallbladder findings. Impression 4 most likely reflects description of the dilated distal stomach. The gallbladder is nondistended and there are no calcified stones. Electronically Signed   By: Tiburcio Pea M.D.   On: 11/01/2023 08:10   Result Date: 11/01/2023 CLINICAL DATA:  Peritonitis or perforation suspected. EXAM: CT ABDOMEN AND PELVIS WITH CONTRAST TECHNIQUE: Multidetector CT imaging of the abdomen and pelvis was performed using the standard protocol following bolus administration of intravenous contrast. RADIATION DOSE REDUCTION: This exam was performed according to the departmental dose-optimization program which includes automated exposure control, adjustment of the mA and/or kV according to patient size and/or use of iterative reconstruction technique. CONTRAST:  OMNIPAQUE IOHEXOL 300 MG/ML  SOLN COMPARISON:  CT chest abdomen and pelvis 10/24/2023 FINDINGS: Lower chest: There is atelectasis in the lung bases. Hepatobiliary: No focal liver abnormality is seen. The gallbladder is now dilated and contains multiple gallstones. There is a new small focus of air in the gallbladder. Findings concerning for complex dilated common bile duct. There is inflammatory stranding surrounding the common bile duct. Pancreas: Unremarkable. No pancreatic ductal dilatation or surrounding inflammatory changes. Spleen: Normal in size without focal abnormality. Adrenals/Urinary Tract:  There is a small fat containing right adrenal nodule compatible with myelolipoma measuring 7 mm. The left adrenal gland is within normal limits. There are bilateral renal cysts. The largest is in the superior pole the right kidney measuring 4.8 cm. Mildly hyperdense lesion in the inferior pole of the left kidney measures 2.5 cm and appears unchanged. There is no hydronephrosis or perinephric fat stranding. Tiny bladder calculus is seen lying in the dependent portion of the bladder. Stomach/Bowel: There is wall thickening of the distal esophagus. There is a new gastroduodenal anastomosis near the greater curvature of the stomach. The stomach is markedly dilated with large air-fluid level as seen on prior. Heterogeneous masslike density seen in the region of the first and second portion of the duodenal measuring 5.3 x 4.5 by 5.9 cm with some new central fluid areas of attenuation/low-density. There is surrounding inflammatory stranding. There is a new small amount of free air throughout the upper abdomen. Remaining small bowel and colon are within normal limits. Oral contrast reaches the rectum. There are few scattered sigmoid colon diverticula. The appendix is  not seen. Vascular/Lymphatic: Aortic atherosclerosis. No enlarged abdominal or pelvic lymph nodes. Reproductive: Prostate gland is enlarged. Other: Small fat containing umbilical hernia. There is no ascites. Small amount of free air throughout the upper abdomen. No evidence for abscess. Musculoskeletal: The bones are osteopenic. Degenerative changes affect the spine. IMPRESSION: 1. New small amount of free air throughout the upper abdomen may be related to recent abdominal surgery versus perforated viscus. 2. New gastroduodenal anastomosis. 3. Heterogeneous masslike density in the region of the first and second portion of the duodenum with some new central fluid areas of attenuation. Findings may represent hematoma, mass and/or infection. 4. New dilated  gallbladder with multiple gallstones. There is a new small focus of air in the gallbladder. Findings are concerning for cholecystitis with possible emphysematous cholecystitis. Findings may also be related to direct connection of gallbladder with duodenum. Surgical consultation recommended. 5. Complex dilated common bile duct with inflammatory stranding surrounding the common bile duct. Findings may be related to choledocholithiasis. 6. Stable indeterminate 2.5 cm left renal lesion. This can be further evaluated with MRI. 7. Stable marked dilatation of the stomach with large air-fluid level. 8. Stable wall thickening of the distal esophagus. Aortic Atherosclerosis (ICD10-I70.0) and Emphysema (ICD10-J43.9). These results were called by telephone at the time of interpretation on 10/31/2023 at 10:47 pm to provider OLADAPO ADEFESO , who verbally acknowledged these results. Electronically Signed: By: Darliss Cheney M.D. On: 10/31/2023 22:48   DG CHEST PORT 1 VIEW  Result Date: 11/01/2023 CLINICAL DATA:  Recent abdominal surgery. Respiratory distress. [id number]. EXAM: PORTABLE CHEST 1 VIEW COMPARISON:  Portable chest 10/31/2023 FINDINGS: 7:33 a.m. Right PICC terminates in the mid SVC at the azygous confluence. There has been a slight interval pullback. There is mild cardiomegaly but no evidence of CHF. The mediastinal configuration is stable with aortic tortuosity and atherosclerosis. The lungs are clear apart from again noted small left pleural effusion. No new osseous abnormality. Free air underneath the domes of the diaphragm is less conspicuous today. IMPRESSION: 1. Right PICC tip in the SVC at the azygous confluence with a slight interval pullback. 2. Small left pleural effusion. No other acute radiographic chest findings. 3. Free air underneath the domes of the diaphragm is less conspicuous today. Electronically Signed   By: Almira Bar M.D.   On: 11/01/2023 07:53   CT HEAD WO CONTRAST ( )  Result Date:  10/31/2023 CLINICAL DATA:  Initial evaluation for altered mental status. EXAM: CT HEAD WITHOUT CONTRAST TECHNIQUE: Contiguous axial images were obtained from the base of the skull through the vertex without intravenous contrast. RADIATION DOSE REDUCTION: This exam was performed according to the departmental dose-optimization program which includes automated exposure control, adjustment of the mA and/or kV according to patient size and/or use of iterative reconstruction technique. COMPARISON:  Prior CT from 10/30/2015. FINDINGS: Brain: Age-related cerebral atrophy with moderately advanced chronic microvascular ischemic disease. No acute intracranial hemorrhage. 2.3 cm hypodensity seen involving the superior right insula/right frontal operculum, age indeterminate, but could reflect an evolving acute ischemic infarct (series 2, image 17). No other acute large vessel territory infarct. No mass lesion or midline shift. No hydrocephalus or extra-axial fluid collection. Vascular: No abnormal hyperdense vessel. Calcified atherosclerosis present at the skull base. Skull: Scalp soft tissues demonstrate no acute finding. Calvarium intact. Sinuses/Orbits: Globes orbital soft tissues within normal limits. Paranasal sinuses and mastoid air cells are clear. Other: None. IMPRESSION: 1. 2.3 cm hypodensity involving the superior right insula/right frontal operculum, age indeterminate, but could reflect  an evolving acute ischemic infarct. Correlation with dedicated MRI suggested for further evaluation. 2. No other acute intracranial abnormality. 3. Age-related cerebral atrophy with moderately advanced chronic microvascular ischemic disease. Electronically Signed   By: Rise Mu M.D.   On: 10/31/2023 22:13   DG Chest Port 1 View  Result Date: 10/31/2023 CLINICAL DATA:  Respiratory distress.  Recent abdominal surgery. EXAM: PORTABLE CHEST 1 VIEW COMPARISON:  Abdominal x-ray 10/30/2023. FINDINGS: Right upper extremity  PICC terminates over the SVC. The lungs are clear. There is no pleural effusion or pneumothorax. The cardiomediastinal silhouette is within normal limits. There is a small amount of free air in the upper abdomen. IMPRESSION: 1. No active disease in the chest. 2. Small amount of free air in the upper abdomen, likely related to recent abdominal surgery. Please correlate clinically. If there is high clinical concern for acute pathology, recommend CT. Electronically Signed   By: Darliss Cheney M.D.   On: 10/31/2023 19:56    Anti-infectives: Anti-infectives (From admission, onward)    Start     Dose/Rate Route Frequency Ordered Stop   11/02/23 1500  vancomycin (VANCOREADY) IVPB 1250 mg/250 mL        1,250 mg 166.7 mL/hr over 90 Minutes Intravenous Every 24 hours 11/01/23 1508     11/01/23 1500  vancomycin (VANCOREADY) IVPB 1750 mg/350 mL        1,750 mg 175 mL/hr over 120 Minutes Intravenous  Once 11/01/23 1424 11/01/23 1656   10/31/23 2200  piperacillin-tazobactam (ZOSYN) IVPB 3.375 g        3.375 g 12.5 mL/hr over 240 Minutes Intravenous Every 8 hours 10/31/23 1753     10/26/23 1400  ceFAZolin (ANCEF) IVPB 2g/100 mL premix        2 g 200 mL/hr over 30 Minutes Intravenous On call to O.R. 10/26/23 1341 10/26/23 1558   10/26/23 1343  ceFAZolin (ANCEF) 2-4 GM/100ML-% IVPB       Note to Pharmacy: Sherren Kerns H: cabinet override      10/26/23 1343 10/26/23 1559        Assessment/Plan POD#7 s/p Gastrojejunostomy 10/26/23 by Dr. Robyne Peers for GOO secondary to PUD stricture/duodenitis and portal vein thrombosis - CT a/p 11/13 shows small amount of free air but no free fluid to indicate a leak or other intraabdominal issue; radiology reports heterogeneous masslike density in the region of the first and second portion of the duodenum with some new central fluid areas of attenuation that may represent hematoma, mass and/or infection. The stomach is markedly dilated.  - high volume NG output. continue  NPO/NGT to LIWS, nothing per tube for now. Stomach is likely not emptying through GJ well yet - discussed with primary team, unclear source of shock at this time. Will obtain u/s to look at gallbladder   ID - zosyn, vancomycin VTE - heparin gtt FEN - IVF, NPO/NGT to LIWS. Restart TPN Foley - none   AMS, ?acute stroke - MRI brain pending Shock VDRF DM HTN HLD CAD COPD IgG MGUS  I reviewed CCM notes, last 24 h vitals and pain scores, last 48 h intake and output, last 24 h labs and trends, and last 24 h imaging results.    LOS: 10 days    Franne Forts, [facility] Surgery 11/02/2023, 8:48 AM Please see Amion for pager number during day hours 7:00am-4:30pm

## 2023-11-02 NOTE — Progress Notes (Signed)
PHARMACY - ANTICOAGULATION Pharmacy Consult for heparin Indication:  VTE treatment   Allergies  Allergen Reactions   Asa [Aspirin] Other (See Comments)    Stomach pain   Crestor [Rosuvastatin] Other (See Comments)    Stomach pain   Morphine Nausea And Vomiting   Wellbutrin [Bupropion] Other (See Comments)    Stomach pain   Zoloft [Sertraline Hcl] Other (See Comments)    Stomach pain    Patient Measurements: Height: 6' (182.9 cm) Weight: 83.5 kg (184 lb 1.4 oz) IBW/kg (Calculated) : 77.6 Heparin Dosing Weight: 88 kg  Vital Signs: Temp: 97.6 F (36.4 C) (11/15 0332) Temp Source: Axillary (11/15 0332) BP: 109/76 (11/15 0325) Pulse Rate: 99 (11/15 0230)  Labs: Recent Labs    10/31/23 0435 10/31/23 1814 11/01/23 0425 11/01/23 1407 11/01/23 1425 11/01/23 1515 11/01/23 2000 11/02/23 0339  HGB 11.5*   < > 11.9* 12.9* 9.7*  --   --  10.8*  HCT 37.1*   < > 38.7* 38.0* 33.5*  --   --  34.7*  PLT 279   < > 178  --  83*  --   --  90*  HEPARINUNFRC 0.71*  --  0.57  --   --   --   --  0.97*  CREATININE 1.36*   < > 1.52*  --   --  1.93* 2.80* 3.28*   < > = values in this interval not displayed.    Estimated Creatinine Clearance: 22 mL/min (A) (by C-G formula based on SCr of 3.28 mg/dL (H)).  Assessment: 72 y.o. male with possible PE for heparin.  Ultrasound negative for portal vein thrombosis. CBC WNL  Heparin level at goal this morning on 1950 units/hr. Overnight events noted, patient with hypoxia and moved to SDU for increased monitoring. Issue with TPN per previous pharmacist and TPN was stopped. Also reported heparin was off most of the night due to above events. Appears to be infusing now. No bleeding issues noted. CBC has remained stable.   11/15 AM update:  Heparin level supra-therapeutic   Goal of Therapy:  Heparin level 0.3-0.7 units/ml Monitor platelets by anticoagulation protocol: Yes   Plan:  Dec heparin to 1750 units/hr 1300 heparin level  Abran Duke, PharmD, BCPS Clinical Pharmacist Phone: [phone]

## 2023-11-02 NOTE — Progress Notes (Signed)
SLP Cancellation Note  Patient Details Name: JAYDDEN BEMBENEK MRN: [id number] DOB: Feb 12, 1951   Cancelled treatment:       Reason Eval/Treat Not Completed: Medical issues which prohibited therapy;Patient not medically ready (Pt transferred from [facility]. Pt currently intubated. Please reconsult when pt medically appropriate for SLP evaluation.)  Clyde Canterbury, M.S., CCC-SLP Speech-Language Pathologist Secure Chat Preferred  O: [phone]  Woodroe Chen 11/02/2023, 8:09 AM

## 2023-11-02 NOTE — Progress Notes (Signed)
This RN assessed pt's PICC per consult for possible occlusion. Per primary RN, white lumen unable to flush and other lumens beeping for possible occlusion every few minutes. Able to flush 5ml of saline into white lumen without blood return. Ordered TPA per protocol. Unable to instill tpa due to total occlusion. Changed position of pt arm, attempted to flush again and instill tpa again without success. Per PICC RN Jola Babinski, ok to change dressing and pull picc out 0.5cm and attempt to flush due to having recent x ray. Changed dressing & pulled PICC out 0.5 cm  w/out incident. Unable to flush or instill tpa at this time. Iv team RN Dorie at bedside and also unable to flush. Chest xray ordered at this time, primary RN at bedside and aware.

## 2023-11-02 NOTE — Progress Notes (Signed)
PHARMACY - TOTAL PARENTERAL NUTRITION CONSULT NOTE   Indication:  Gastric outlet obstruction  Patient Measurements: Height: 6' (182.9 cm) Weight: 86.4 kg (190 lb 7.6 oz) IBW/kg (Calculated) : 77.6 TPN AdjBW (KG): 87.2 Body mass index is 25.83 kg/m.   Assessment: Patient with gastric outlet obstruction preventing him from passing any food or drink and resulting with his abdominal distention which is causing pain, nausea, and vomiting. Since this area of his duodenum has been inflamed for such a significant period of time it has caused secondary complications. Pt  is s/p gastrojejunostomy to bypass gastric outlet obstruction on 11/8. UGI showed inability to pass through gastrojejunostomy on 11/11. TPN was discontinued on 11/13. Patient was transferred to [facility] on 11/14 for septic shock. Patient has high volume NG output reflecting that stomach is not emptying into GJ well yet. Surgery transitioned pt to NPO and NGT to LIWS. Pharmacy consulted to resume TPN on 11/15.   Glucose / Insulin: T2DM, A1c 6, cBG 152-603 - insulin gtt initiated 11/14 PM, then transitioned to mSSI (18 units/24h) and started on D5 @ 81ml/hr (d/c'd 11/15 AM) Hydrocortisone 11/14 >> for refractory shock Electrolytes: Na 132, K 4.8, Cl 101, CO2 17, Phos 6.7, Mag 2.5 Renal: SCr up 3.28, BUN down 89 Hepatic: AST wnl, ALT up 47, AlkPhos low 28, Tbili down 1.2, Albumin 2.1, TG 133 Intake / Output; MIVF: UOP 0.3 ml/kg/min, NG output 2900 mL, LBM11/14 MIVF D5 @ 107ml/hr ~18 hr, LR @ 101ml/hr, Sodium bicarb gtt 31ml/hr, Propofol 149 mL GI Imaging/procedures:  11/6 EGD showed distal duodenal obstruction.  11/8 gastrojugenostomy  11/11 UGI - no definitive passage of contrast from the markedly dilated stomach through the gastrojejunostomy 11/13 CT A/P - small amount of free air but no free fluid to indicate a leak or other intraabdominal issue; heterogeneous masslike density in the region of the first and second portion of the duodenum  with some new central fluid areas of attenuation that may represent hematoma, mass and/or infection. The stomach is markedly dilated.  11/14 Liver/GB US - results pending  Central access: 11/8 PICC line TPN start date: 11/8  Nutritional Goals: Clinimix 8/10 2 L w/ out Lytes TWFSaSu @ 82 mL/hr Clinimix E 8/10 1 L w/ Lytes  M/Th @ 42 mL/hr SMOFlipid 250 mL daily To provide an average of 137 g protein and 1586 kcal per day  RD Assessment: Estimated Needs Total Energy Estimated Needs: 2300-2600 Total Protein Estimated Needs: 130-145 gm Total Fluid Estimated Needs: 2.3-2.6 L  Current Nutrition:  NPO  Plan:  Reinitiate TPN with Clinimix 8/10 at 1800:  - 2 L w/ out Lytes TWFSatSun (@ 82 mL/hr) - 1 L w/ Lytes MTh (@ 42 mL/hr) - SMOF lipid 250 mL daily - To provide an average of 137 g protein and 1586 kcal per day, meeting 100% protein and ~69% kcal goals, 28% of kcal from lipids - Electrolytes in Clinimix E 8/10: (1000 mL/day): Na 35 mEq, K 30 mEq, Ca 5 mEq, Mag 5 mEq, Phos 15 mmol, Acetate 83 Replace additional electrolytes outside TPN as needed Add standard MVI and trace elements to TPN Increase to resistant q4h SSI and start glargine 40 units daily and adjust as needed Discontinue LR at 1800, continue sodium bicarb infusion at 72ml/hr per MD Transition from propfol to dexmedetomidine for sedation on vent Monitor TPN labs daily while on Clinimix, TG qMon  Wilburn Cornelia, PharmD, BCPS Clinical Pharmacist 11/02/2023 12:59 PM   Please refer to [facility] for pharmacy phone  number

## 2023-11-02 NOTE — Progress Notes (Signed)
Pharmacy Antibiotic Note  Matthew Molina is a 72 y.o. male admitted on 10/23/2023 with sepsis.  Pharmacy has been consulted for Vancomycin dosing. Patient was initiated on Zosyn (piperacillin-tazobactam) on 10/31/23.   WBC trending up to 17.4 (on steroids), afebrile, HR 90-100s,  Scr trending up to 3.28  Plan: Change vancomycin to dosing per levels for unstable renal function    > Check vancomycin levels as needed per renal function trends    > Goal vancomycin trough level 15-20  Continue Zosyn 3.375g IV q8h extended-infusion per MD Monitor daily CBC, temp, SCr, and for clinical signs of improvement  F/u cultures and de-escalate antibiotics as able   Height: 6' (182.9 cm) Weight: 86.4 kg (190 lb 7.6 oz) IBW/kg (Calculated) : 77.6  Temp (24hrs), Avg:98.2 F (36.8 C), Min:97.6 F (36.4 C), Max:98.6 F (37 C)  Recent Labs  Lab 10/31/23 0435 10/31/23 1814 11/01/23 0029 11/01/23 0425 11/01/23 0900 11/01/23 1131 11/01/23 1425 11/01/23 1515 11/01/23 1617 11/01/23 2000 11/02/23 0339  WBC 13.2* 15.8*  --  14.5*  --   --  9.1  --   --   --  17.4*  CREATININE 1.36* 1.43* 1.55* 1.52*  --   --   --  1.93*  --  2.80* 3.28*  LATICACIDVEN  --  1.7  --   --  2.5* 2.0* 1.7  --  3.7*  --   --     Estimated Creatinine Clearance: 22 mL/min (A) (by C-G formula based on SCr of 3.28 mg/dL (H)).    Allergies  Allergen Reactions   Asa [Aspirin] Other (See Comments)    Stomach pain   Crestor [Rosuvastatin] Other (See Comments)    Stomach pain   Morphine Nausea And Vomiting   Wellbutrin [Bupropion] Other (See Comments)    Stomach pain   Zoloft [Sertraline Hcl] Other (See Comments)    Stomach pain    Antimicrobials this admission: Zosyn 11/13 >>  Vancomycin 11/14 >>   Dose adjustments this admission: 11/15 - vancomycin changed to dosing per levels  Microbiology results: 11/6 MRSA PCR: not detected 11/13 Bcx x2: ngtd x2d 11/13 MRSA PCR: not detected 11/14 Sputum Cx: moderate gram  positive cocci in clusters, moderate budding yeast, few gram positive rods  11/14 MRSA PCR: not detected  Thank you for allowing pharmacy to be a part of this patient's care.  Wilburn Cornelia, PharmD, BCPS Clinical Pharmacist 11/02/2023 1:40 PM   Please refer to [facility] for pharmacy phone number

## 2023-11-02 NOTE — Progress Notes (Signed)
1355: Secure chatted primary RN's and discussed patient's access options. Recommended central line placement at this time due to inability to pause high dose vesicants in order to instill tpa.  1400: Primary Rn's added Dr. Wynona Neat & Dr. Saintclair Halsted [facility] to chat.  [phone]: secure chat seen by all participants with no response  Please feel free to reach back out to IV team with any other concerns regarding patient access.

## 2023-11-02 NOTE — Progress Notes (Addendum)
PHARMACY - ANTICOAGULATION Pharmacy Consult for heparin Indication:  VTE treatment   Allergies  Allergen Reactions   Asa [Aspirin] Other (See Comments)    Stomach pain   Crestor [Rosuvastatin] Other (See Comments)    Stomach pain   Morphine Nausea And Vomiting   Wellbutrin [Bupropion] Other (See Comments)    Stomach pain   Zoloft [Sertraline Hcl] Other (See Comments)    Stomach pain    Patient Measurements: Height: 6' (182.9 cm) Weight: 86.4 kg (190 lb 7.6 oz) IBW/kg (Calculated) : 77.6 Heparin Dosing Weight: 88 kg  Vital Signs: Temp: 98 F (36.7 C) (11/15 1518) Temp Source: Axillary (11/15 1518) BP: 127/65 (11/15 1615) Pulse Rate: 94 (11/15 1615)  Labs: Recent Labs    11/01/23 0425 11/01/23 1407 11/01/23 1425 11/01/23 1515 11/01/23 2000 11/02/23 0339 11/02/23 0734 11/02/23 1506  HGB 11.9*   < > 9.7*  --   --  10.8* 10.9*  --   HCT 38.7*   < > 33.5*  --   --  34.7* 32.0*  --   PLT 178  --  83*  --   --  90*  --   --   HEPARINUNFRC 0.57  --   --   --   --  0.97*  --  0.63  CREATININE 1.52*  --   --  1.93* 2.80* 3.28*  --   --    < > = values in this interval not displayed.    Estimated Creatinine Clearance: 22 mL/min (A) (by C-G formula based on SCr of 3.28 mg/dL (H)).  Assessment: 72 y.o. male with possible PE for heparin.  Ultrasound negative for portal vein thrombosis. CBC WNL  Heparin level at goal this morning on 1950 units/hr. Overnight events noted, patient with hypoxia and moved to SDU for increased monitoring. Issue with TPN per previous pharmacist and TPN was stopped. Also reported heparin was off most of the night due to above events. Appears to be infusing now. No bleeding issues noted. CBC has remained stable.   11/15 PM: heparin level 0.63 (therapeutic) on heparin 1750 units/hr. No issues with the infusion or bleeding reported per RN.  Goal of Therapy:  Heparin level 0.3-0.7 units/ml Monitor platelets by anticoagulation protocol: Yes   Plan:   Continue heparin at 1750 units/hr Check confirmatory heparin level in 8hrs   Loralee Pacas, PharmD, BCPS Clinical Pharmacist Phone: [phone]  ADDENDUM: Given acute ischemic stroke, will lower heparin goal to 0.3-0.5. Decrease heparin infusion to 1550 units/hr and check heparin level in 8hrs.  Loralee Pacas, PharmD, BCPS 11/02/2023 8:10 PM

## 2023-11-03 DIAGNOSIS — J9601 Acute respiratory failure with hypoxia: Secondary | ICD-10-CM | POA: Diagnosis not present

## 2023-11-03 DIAGNOSIS — I81 Portal vein thrombosis: Secondary | ICD-10-CM | POA: Diagnosis not present

## 2023-11-03 DIAGNOSIS — G9340 Encephalopathy, unspecified: Secondary | ICD-10-CM | POA: Diagnosis not present

## 2023-11-03 LAB — GLUCOSE, CAPILLARY
Glucose-Capillary: 298 mg/dL — ABNORMAL HIGH (ref 70–99)
Glucose-Capillary: 290 mg/dL — ABNORMAL HIGH (ref 70–99)
Glucose-Capillary: 244 mg/dL — ABNORMAL HIGH (ref 70–99)
Glucose-Capillary: 211 mg/dL — ABNORMAL HIGH (ref 70–99)
Glucose-Capillary: 225 mg/dL — ABNORMAL HIGH (ref 70–99)
Glucose-Capillary: 227 mg/dL — ABNORMAL HIGH (ref 70–99)

## 2023-11-03 LAB — COMPREHENSIVE METABOLIC PANEL
ALT: 32 U/L (ref 0–44)
AST: 24 U/L (ref 15–41)
Albumin: 1.7 g/dL — ABNORMAL LOW (ref 3.5–5.0)
Alkaline Phosphatase: 26 U/L — ABNORMAL LOW (ref 38–126)
Anion gap: 10 (ref 5–15)
BUN: 88 mg/dL — ABNORMAL HIGH (ref 8–23)
CO2: 19 mmol/L — ABNORMAL LOW (ref 22–32)
Calcium: 9.3 mg/dL (ref 8.9–10.3)
Chloride: 103 mmol/L (ref 98–111)
Creatinine, Ser: 2.25 mg/dL — ABNORMAL HIGH (ref 0.61–1.24)
GFR, Estimated: 30 mL/min — ABNORMAL LOW (ref >60)
Glucose, Bld: 348 mg/dL — ABNORMAL HIGH (ref 70–99)
Potassium: 3.5 mmol/L (ref 3.5–5.1)
Sodium: 132 mmol/L — ABNORMAL LOW (ref 135–145)
Total Bilirubin: 0.7 mg/dL (ref <1.2)
Total Protein: 5.2 g/dL — ABNORMAL LOW (ref 6.5–8.1)

## 2023-11-03 LAB — CBC
HCT: 25.5 % — ABNORMAL LOW (ref 39.0–52.0)
Hemoglobin: 8.2 g/dL — ABNORMAL LOW (ref 13.0–17.0)
MCH: 27.7 pg (ref 26.0–34.0)
MCHC: 32.2 g/dL (ref 30.0–36.0)
MCV: 86.1 fL (ref 80.0–100.0)
Platelets: 55 10*3/uL — ABNORMAL LOW (ref 150–400)
RBC: 2.96 MIL/uL — ABNORMAL LOW (ref 4.22–5.81)
RDW: 16.8 % — ABNORMAL HIGH (ref 11.5–15.5)
WBC: 12.5 10*3/uL — ABNORMAL HIGH (ref 4.0–10.5)
nRBC: 0.8 % — ABNORMAL HIGH (ref 0.0–0.2)

## 2023-11-03 LAB — MAGNESIUM: Magnesium: 2.3 mg/dL (ref 1.7–2.4)

## 2023-11-03 LAB — PHOSPHORUS: Phosphorus: 3.9 mg/dL (ref 2.5–4.6)

## 2023-11-03 LAB — HEPARIN LEVEL (UNFRACTIONATED): Heparin Unfractionated: 0.37 [IU]/mL (ref 0.30–0.70)

## 2023-11-03 LAB — VANCOMYCIN, RANDOM: Vancomycin Rm: 9 ug/mL

## 2023-11-03 MED ORDER — FENTANYL CITRATE PF 50 MCG/ML IJ SOSY: 25.0000 ug | PREFILLED_SYRINGE | INTRAMUSCULAR | Status: DC | PRN

## 2023-11-03 MED ORDER — INSULIN GLARGINE-YFGN 100 UNIT/ML ~~LOC~~ SOLN
35.0000 [IU] | Freq: Two times a day (BID) | SUBCUTANEOUS | Status: DC
  Administered 2023-11-03 (×2): 35 [IU] via SUBCUTANEOUS
  Filled 2023-11-03 (×4): qty 0.35

## 2023-11-03 MED ORDER — FAT EMUL FISH OIL/PLANT BASED 20% (SMOFLIPID)IV EMUL
250.0000 mL | INTRAVENOUS | Status: AC
  Administered 2023-11-03: 250 mL via INTRAVENOUS
  Filled 2023-11-03: qty 250

## 2023-11-03 MED ORDER — TRACE MINERALS CU-MN-SE-ZN 300-55-60-3000 MCG/ML IV SOLN
INTRAVENOUS | Status: AC
  Filled 2023-11-03 (×2): qty 2000

## 2023-11-03 MED ORDER — DEXMEDETOMIDINE HCL IN NACL 400 MCG/100ML IV SOLN
0.0000 ug/kg/h | INTRAVENOUS | Status: DC
Start: 2023-11-03 — End: 2023-11-09
  Administered 2023-11-03: 0.4 ug/kg/h via INTRAVENOUS
  Administered 2023-11-04: 1 ug/kg/h via INTRAVENOUS
  Administered 2023-11-04: 0.9 ug/kg/h via INTRAVENOUS
  Administered 2023-11-04: 0.6 ug/kg/h via INTRAVENOUS
  Administered 2023-11-04: 0.5 ug/kg/h via INTRAVENOUS
  Administered 2023-11-05 (×2): 0.9 ug/kg/h via INTRAVENOUS
  Administered 2023-11-05: 1 ug/kg/h via INTRAVENOUS
  Administered 2023-11-05: 1.1 ug/kg/h via INTRAVENOUS
  Administered 2023-11-06: 0.5 ug/kg/h via INTRAVENOUS
  Administered 2023-11-06: 1 ug/kg/h via INTRAVENOUS
  Administered 2023-11-06: 0.5 ug/kg/h via INTRAVENOUS
  Filled 2023-11-03 (×12): qty 100

## 2023-11-03 MED ORDER — INSULIN ASPART 100 UNIT/ML IJ SOLN: 8.0000 [IU] | INTRAMUSCULAR | Status: DC

## 2023-11-03 MED ORDER — FENTANYL CITRATE PF 50 MCG/ML IJ SOSY
25.0000 ug | PREFILLED_SYRINGE | INTRAMUSCULAR | Status: DC | PRN
  Administered 2023-11-03 – 2023-11-04 (×3): 100 ug via INTRAVENOUS
  Filled 2023-11-03 (×3): qty 2

## 2023-11-03 MED ORDER — HYDROCORTISONE SOD SUC (PF) 100 MG IJ SOLR: 50.0000 mg | Freq: Two times a day (BID) | INTRAMUSCULAR | Status: DC

## 2023-11-03 MED ORDER — POTASSIUM CHLORIDE 10 MEQ/50ML IV SOLN
10.0000 meq | INTRAVENOUS | Status: AC
  Administered 2023-11-03 (×2): 10 meq via INTRAVENOUS
  Filled 2023-11-03 (×2): qty 50

## 2023-11-03 MED ORDER — HYDROCORTISONE SOD SUC (PF) 100 MG IJ SOLR
100.0000 mg | Freq: Every day | INTRAMUSCULAR | Status: DC
  Administered 2023-11-03 – 2023-11-06 (×4): 100 mg via INTRAVENOUS
  Filled 2023-11-03 (×4): qty 2

## 2023-11-03 MED ORDER — HYDROCORTISONE SOD SUC (PF) 100 MG IJ SOLR: 100.0000 mg | Freq: Two times a day (BID) | INTRAMUSCULAR | Status: DC

## 2023-11-03 NOTE — Plan of Care (Signed)
  Problem: Pain Management: Goal: General experience of comfort will improve Outcome: Progressing   Problem: Skin Integrity: Goal: Risk for impaired skin integrity will decrease Outcome: Progressing   Problem: Education: Goal: Knowledge of General Education information will improve Description: Including pain rating scale, medication(s)/side effects and non-pharmacologic comfort measures Outcome: Not Progressing   Problem: Clinical Measurements: Goal: Will remain free from infection Outcome: Not Progressing Goal: Diagnostic test results will improve Outcome: Not Progressing   Problem: Nutrition: Goal: Adequate nutrition will be maintained Outcome: Not Progressing   Problem: Respiratory: Goal: Ability to maintain a clear airway and adequate ventilation will improve Outcome: Not Progressing

## 2023-11-03 NOTE — Progress Notes (Addendum)
NAME:  Matthew Molina, MRN:  [id number], DOB:  1951-02-28, LOS: 11 ADMISSION DATE:  10/23/2023, CONSULTATION DATE:  11/14 REFERRING MD:  Dr. Arbutus Leas, CHIEF COMPLAINT:  septic shock   History of Present Illness:  Patient is a 72 yo M w/ pertinent PMH IgG MGUS, COPD, DMT2, HTN, HLD presents to [facility] on 11/5 w/ abd pain, N/V.  Patient recently admitted to hospital on 10/13 w/ N/V and abd pain. Treated for aspiration pna. GI performed EGD showing grade B esophagitis, duodenitis, and gastric ulcer nonbleeding. Discharged 10/16 w/ PPI and sucralfate.   Patient began having gradual worsening abd pain over the past week. Also having some N/V and decreased po intake. Generalized weakness. Denies sob, diarrhea, chest pain. Came to [facility] on 11/5. On arrival hemodynamically stable and sats 92% on room air. Lipase 84. CT abd/pelvis w/ contrast marked distention of the stomach to the level of the duodenum where there is circumferential thickening and heterogenous hypoattenuation at the site of the patient's previous duodenitis; new hypodensity upstream portal vein suspicious for nonocclusive thrombus and partially imaged hypodensity in the right upper lobe pulmonary artery which may represent artifact versus PE. Started on heparin. GI and surgery consulted. Patient underwent gastrojejunostomy for concern of gastric outlet obstruction secondary to PUD/duodenitis on 10/26/2023. Patient started on TPN for short period and stopped 11/13.   Overnight on 11/13 patient with increased respiratory distress and AMS. Having some abd pain. CT head 2.3 cm hypodensity involving the superior right insula/right frontal operculum, age indeterminate, but could reflect an evolving acute ischemic infarct.  CT abd/pelvis w/ contrast showing small amount of free air throughout abd; new dilated gallbladder w/ multiple gallstones concerning for cholecystits. Surgery consulted and no intervention at this time. Patient w/ increasing AMS and required  intubation. Patient also w/ hypotension requiring levophed. Plan to transfer to [facility] and PCCM consulted.  Pertinent  Medical History   Past Medical History:  Diagnosis Date   Acute blood loss anemia 01/05/2020   Acute respiratory failure with hypoxia (HCC)    Arthritis    COPD (chronic obstructive pulmonary disease) (HCC)    Depression    Diabetes mellitus    Diabetes mellitus without complication (HCC)    Diverticulitis    Elevated PSA 10/01/2016   Encounter for support and coordination of transition of care 01/14/2020   Hypercholesterolemia    Hyperlipidemia    Hypertension    Insomnia 01/05/2016   Ischemic colitis (HCC) 01/05/2020   Multiple lung nodules on CT 04/02/2015   Nicotine addiction    Obesity    Oxygen deficiency    qhs   Schizophrenia (HCC)    Significant Hospital Events: Including procedures, antibiotic start and stop dates in addition to other pertinent events   11/5 admit to [facility]  Patient underwent gastrojejunostomy for concern of gastric outlet obstruction secondary to PUD/duodenitis on 10/26/2023 11/13 change in MS. Concern for acute stroke  11/14 transferred to [facility] for respiratory failure and septic shock. Intubated on levo. Pccm consulted 11/15 On propofol, fentanyl Requiring vasopressin and norepinephrine Unresponsive, Cough, gag, corneals present  Interim History / Subjective:  No distress but very weak  Not able to follow commands  Objective   Blood pressure 110/76, pulse 83, temperature 98.6 F (37 C), temperature source Oral, resp. rate (!) 27, height 6' (1.829 m), weight 84.7 kg, SpO2 96%.    Vent Mode: PRVC FiO2 (%):  [40 %-50 %] 40 % Set Rate:  [26 bmp] 26 bmp Vt Set:  [  620 mL] 620 mL PEEP:  [5 cmH20] 5 cmH20 Plateau Pressure:  [15 cmH20-17 cmH20] 15 cmH20   Intake/Output Summary (Last 24 hours) at 11/03/2023 0740 Last data filed at 11/03/2023 0700 Gross per 24 hour  Intake 4058.3 ml  Output 2150 ml  Net 1908.3 ml   Filed Weights    11/01/23 1321 11/02/23 0500 11/03/23 0500  Weight: 83.5 kg 86.4 kg 84.7 kg    Examination: General 72 year old male who is laying in bed. Now on PSV 5 but cough weak. Not able to follow commands HENT orally intubated. No JVD  Pulm scattered rhonchi. VTs 700s on PSV 5 Card RRR Abd soft, staples intact Ext + LE edema brisk CR warm  Neuro awake but does not interact. Flaccid.  Gu cl yellow   Resolved Hospital Problem list     Assessment & Plan:   Acute hypoxemic respiratory failure due to aspiration PNA.  -sputum culture re-incubated -cxr aeration improved. Tmax 98.6, wbc improved. Mental status and deconditioning appear to be largest barrier.   Plan Cont full vent support VAP bundle PAD protocol for RASS goal -1 Day 4 of X vanc and zosyn PRN CXR Pulse ox, sats >90%  History of obstructive lung disease Plan Continue Brovana, continue Pulmicort and DuoNeb  Septic shock due to asp PNA and probable bacterial translocation from gut w/ on-going drug induced hypotension (remains on dex and fent gtts) -still on NE and Vaso gtt Plan Cont to keep even volume status Wean Norepi for MAP >65, can dc vasopressin today Cont stress dose steroids.   Severe distal duodenal obstruction s/p gastrojejunostomy 11/8 Cholelithiasis -Surgery continues to follow. Abd US obtained 11/15 sm amt sludge no murphy's sign  Plan Abx per above Cont ppi  Keep strict NPO TPN per surgical team  Cont LIWS (once extubated will have to have OGT placed nasal)  Acute metabolic encephalopathy due to sepsis and metabolic acidosis complicated by  Acute ischemic stroke 11/13 Plan Cont supportive care  Dc fent gtt. Change to PRN Given presence of petechial hemorrhage neuro NOT recommending antiplatelets w/ the heparin  Close obs of neuro status w/ increased risk of ICH PT/OT/rehab svc as able    Portal vein thrombosis Concern for PE Plan Continue heparin  Chronic kidney disease stage  IIIa Stable Plan Renal dose meds  Strict I&O Am chem   Type II DM w/ Steroid induced hyperglycemia  Plan Changed basal coverage  Cont resistant ssi   Fluid and electrolyte imbalance: hyponatremia, boarderline hypokalemia, NAGMA Plan Replace K Correct hyperglycemia (negatively affecting Na Am chem    Hypertension Hyperlipidemia plan Hold antihypertensives at present Continue statin  Depression/anxiety plan Home meds on hold   Best Practice (right click and "Reselect all SmartList Selections" daily)   Diet/type: NPO-->tubefeeds DVT prophylaxis: systemic heparin GI prophylaxis: PPI Lines: N/A Foley:  N/A Code Status:  full code Last date of multidisciplinary goals of care discussion [11/14 updated daughter over phone]   My critical care time 45 min   Simonne Martinet ACNP-BC [facility] Pulmonary/Critical Care Pager # [phone] OR # [phone] if no answer

## 2023-11-03 NOTE — Progress Notes (Signed)
STROKE TEAM PROGRESS NOTE   BRIEF HPI [name] is a 72 y.o. male with history of PMH IgG MGUS, COPD, DMT2, HTN, HLD presents to [facility] on 11/5 w/ abd pain, N/V.   He started to have gradual worsening of his abdominal pain over the past week and some nausea vomiting and decreased p.o. intake as well as generalized weakness and presented to [facility] on 10/23/2023.  CT abdomen pelvis with marked distention of stomach at the level of the duodenum and new hypodensity upstream portal vein suspicious for nonocclusive thrombus and partially imaged hypodensity in the right upper lobe pulmonary artery which was artifact versus PE.  Started on heparin.  GI and surgery consulted.  Underwent gastrojejunostomy for concern of gastric outlet obstruction secondary to PUD/duodenitis on 10/26/2023.  Started on TPN.  On 1113, had increased respiratory distress and AMS.  Worsening abdominal pain.  CT head with a hypodensity in the superior right insula but could reflect air throughout the abdomen, new dilated gallbladder with multiple gallstones concerning for cholecystitis.  Surgery was consulted with no intervention.  Patient had increasing her mental status and required intubation.  Also had extensive hypotension requiring Levophed and was transferred to [facility]. MRI was completed that showed multifocal infarcts with the largest 1 being in the right cerebellum concerning for a central etiology. Neurology consulted. Patient unable to provide any history at this time-intubated, on sedation with Precedex and fentanyl.  SIGNIFICANT HOSPITAL EVENTS 11/5 admit to [facility] 11/14 transferred to [facility] for respiratory failure and septic shock. Intubated on levo. Pccm consulted  INTERIM HISTORY/SUBJECTIVE Remains intubated, sedation off. On TPN, Heparin. Not following commands. No family at bedside     OBJECTIVE  CBC    Component Value Date/Time   WBC 12.5 (H) 11/03/2023 0324   RBC 2.96 (L)  11/03/2023 0324   HGB 8.2 (L) 11/03/2023 0324   HGB 9.6 (L) 10/12/2023 1607   HCT 25.5 (L) 11/03/2023 0324   HCT 30.4 (L) 10/12/2023 1607   PLT 55 (L) 11/03/2023 0324   PLT 280 10/12/2023 1607   MCV 86.1 11/03/2023 0324   MCV 90 10/12/2023 1607   MCH 27.7 11/03/2023 0324   MCHC 32.2 11/03/2023 0324   RDW 16.8 (H) 11/03/2023 0324   RDW 14.0 10/12/2023 1607   LYMPHSABS 1.2 11/01/2023 0425   LYMPHSABS 2.1 06/23/2021 1058   MONOABS 1.3 (H) 11/01/2023 0425   EOSABS 0.1 11/01/2023 0425   EOSABS 0.4 06/23/2021 1058   BASOSABS 0.0 11/01/2023 0425   BASOSABS 0.0 06/23/2021 1058    BMET    Component Value Date/Time   NA 132 (L) 11/03/2023 0324   NA 140 10/12/2023 1607   K 3.5 11/03/2023 0324   CL 103 11/03/2023 0324   CO2 19 (L) 11/03/2023 0324   GLUCOSE 348 (H) 11/03/2023 0324   BUN 88 (H) 11/03/2023 0324   BUN 15 10/12/2023 1607   CREATININE 2.25 (H) 11/03/2023 0324   CREATININE 1.38 (H) 05/26/2020 1149   CALCIUM 9.3 11/03/2023 0324   EGFR 59 (L) 10/12/2023 1607   GFRNONAA 30 (L) 11/03/2023 0324   GFRNONAA 51 (L) 05/26/2020 1149    IMAGING past 24 hours MR BRAIN WO CONTRAST  Result Date: 11/02/2023 CLINICAL DATA:  Stroke suspected EXAM: MRI HEAD WITHOUT CONTRAST TECHNIQUE: Multiplanar, multiecho pulse sequences of the brain and surrounding structures were obtained without intravenous contrast. COMPARISON:  No prior MRI available, correlation is made with CT head 10/31/2023 FINDINGS: Brain: Large area  of restricted diffusion with ADC correlate in the superior right cerebellum (series 5, image 64 and series 7, image 50), with smaller areas of restricted diffusion in the left cerebellum (series 7, images 45-47). These are associated with edema, causing mild mass effect on the posterior right aspect of the pons. The fourth ventricle remains patent. Cortical restricted diffusion with ADC correlate most prominently in right frontal operculum (series 5, image 78), and more superior  right frontal and parietal lobes (series 5, image 89), with the right frontal operculum correlating with the hypodensity noted on 10/31/2023. Additional scattered, smaller areas of restricted diffusion in the bilateral frontal, parietal, and occipital cortex and white matter (series 5, images 70, 74, 78, 82, 85, and 91, for example). These are associated with increased T2 hyperintense signal. The right cerebellar infarct is associated with hemosiderin deposition (series 12, image 20), likely petechial hemorrhage. No other acute hemorrhage, mass, mass effect, or midline shift. No hydrocephalus or extra-axial collection. Scattered punctate foci of hemosiderin deposition in the bilateral cerebral hemispheres, most prominently the bilateral occipital and parietal lobes, with additional sulcal hemosiderin deposition in the medial left frontal lobe and left parietal lobe (series 12, image 14). Confluent T2 hyperintense signal in the periventricular white matter, likely the sequela of severe chronic small vessel ischemic disease. Vascular: Normal arterial flow voids. Skull and upper cervical spine: Normal marrow signal. Sinuses/Orbits: Clear paranasal sinuses. Status post bilateral lens replacements. Other: The mastoids are well aerated. IMPRESSION: 1. Large acute infarct in the superior right cerebellum, with smaller acute infarcts in the left cerebellum. The right cerebellar infarct is associated with petechial hemorrhage. 2. Cortical acute infarct in the right frontal operculum correlates with the hypodensity suspected on the 10/31/2023 CT. Additional primarily cortical acute infarcts in the bilateral frontal, parietal, and occipital lobes. Given multiple vascular territories, a central embolic etiology is suspected. 3. Scattered punctate foci of hemosiderin deposition in the bilateral cerebral hemispheres, which are nonspecific but concerning for cerebral amyloid angiopathy. These results will be called to the ordering  clinician or representative by the Radiologist Assistant, and communication documented in the PACS or [employer]. Electronically Signed   By: Wiliam Ke M.D.   On: 11/02/2023 18:09    Vitals:   11/03/23 1515 11/03/23 1530 11/03/23 1600 11/03/23 1630  BP: 105/73 93/70 112/85 95/78  Pulse: (!) 108 (!) 102 (!) 103 99  Resp: (!) 26 (!) 26 (!) 27 (!) 26  Temp:      TempSrc:      SpO2: 95% 95% 97% 96%  Weight:      Height:         PHYSICAL EXAM General:  Alert, well-nourished, well-developed patient in no acute distress Psych:  Mood and affect appropriate for situation CV: Regular rate and rhythm on monitor Respiratory:  Regular, unlabored respirations on room air GI: Abdomen soft and nontender   NEURO:  Mental Status: Intubated, not following commands. Eyes midline with right gaze preference. Not on sedation, no spontaneous sedation. Pupil round and reactive to light. Will grimace and attempt to withdraw with the RLE otherwise not withdrawing.      ASSESSMENT/PLAN  Acute Ischemic Infarct:  right cerebellum, left cerebellum, right frontal operculum, bilateral frontal, parietal and occipital lobes. Etiology:  Cardioembolic in the setting of shock and portal vein thrombosis CT head 10/31/2023: 2.3 cm hypodensity seen involving the superior right insula/right frontal operculum.    CTA head & neck  MRI   MRA  Large acute infarct in the  superior right cerebellum, with smaller acute infarcts in the left cerebellum. The right cerebellar infarct is associated with petechial hemorrhage 2. Cortical acute infarct in the right frontal operculum correlates with the hypodensity suspected on the 10/31/2023 CT. Additional primarily cortical acute infarcts in the bilateral frontal, parietal, and occipital lobes.  Will repeat CT head in the morning. Patient with large stroke and on heparin 2D Echo EF > 75% LDL 35 HgbA1c 6.0 VTE prophylaxis - Heparin Therapy recommendations:   Pending Disposition:  Pending   Hx of Stroke/TIA None   Hypertension Home meds:  None  Stable Blood Pressure Goal: Normotension   Hyperlipidemia Home meds:  None  LDL 35, goal < 70 High intensity statin not indicated   Diabetes type II: None  HgbA1c 6.0, goal < 7.0  Dysphagia Patient has post-stroke dysphagia, SLP consulted    Diet   Diet NPO time specified   Advance diet as tolerated  Other Active Problems Severe duodenal obstruction Portal vein thrombosis   Hospital day # 11   This patient is critically ill due to Large cerebellar stroke, and multiple strokes in the bilateral hemisphere, respiratory failure and portal vein thrombosis and at significant risk of neurological worsening, death form brain bleeding, swelling, brain compression, and also respiratory failure. This patient's care requires constant monitoring of vital signs, hemodynamics, respiratory and cardiac monitoring, review of multiple databases, neurological assessment, discussion with family, other specialists and medical decision making of high complexity. I spent 35 minutes of neurocritical care time in the care of this patient.    To contact Stroke Continuity provider, please refer to [email or web address]. After hours, contact General Neurology

## 2023-11-03 NOTE — Progress Notes (Signed)
Patient ID: Matthew Molina, male   DOB: 01-04-1951, 72 y.o.   MRN: [id number] [facility] Surgery Progress Note  8 Days Post-Op  Subjective: On vent.  Starting to arouse as sedation weaned.  Not following commands per RN.  Had large CVA noted yesterday.  400cc out of NGT yesterday.  Objective: Vital signs in last 24 hours: Temp:  [98 F (36.7 C)-99.2 F (37.3 C)] 99.2 F (37.3 C) (11/16 0824) Pulse Rate:  [74-123] 106 (11/16 0830) Resp:  [13-34] 15 (11/16 0830) BP: (89-182)/(40-131) 136/84 (11/16 0830) SpO2:  [94 %-100 %] 94 % (11/16 0830) FiO2 (%):  [40 %-50 %] 40 % (11/16 0744) Weight:  [84.7 kg] 84.7 kg (11/16 0500) Last BM Date : 11/01/23  Intake/Output from previous day: 11/15 0701 - 11/16 0700 In: 4058.3 [I.V.:3619.8; IV Piggyback:438.5] Out: 2150 [Urine:1750; Emesis/NG output:400] Intake/Output this shift: No intake/output data recorded.  PE: General: ill appearing male on the vent HEENT: NG present with light bilious output. ETT in place  Heart: tachycardic  Lungs: mechanically ventilated Abd: soft, mild distension, no rigidity, bowel sounds present, midline incision cdi with staples present and no erythema or drainage. Winces some to palpation  Lab Results:  Recent Labs    11/02/23 0339 11/02/23 0734 11/03/23 0324  WBC 17.4*  --  12.5*  HGB 10.8* 10.9* 8.2*  HCT 34.7* 32.0* 25.5*  PLT 90*  --  55*   BMET Recent Labs    11/02/23 2109 11/03/23 0324  NA 131* 132*  K 3.8 3.5  CL 100 103  CO2 18* 19*  GLUCOSE 289* 348*  BUN 92* 88*  CREATININE 2.51* 2.25*  CALCIUM 9.4 9.3   PT/INR No results for input(s): "LABPROT", "INR" in the last 72 hours. CMP     Component Value Date/Time   NA 132 (L) 11/03/2023 0324   NA 140 10/12/2023 1607   K 3.5 11/03/2023 0324   CL 103 11/03/2023 0324   CO2 19 (L) 11/03/2023 0324   GLUCOSE 348 (H) 11/03/2023 0324   BUN 88 (H) 11/03/2023 0324   BUN 15 10/12/2023 1607   CREATININE 2.25 (H) 11/03/2023 0324    CREATININE 1.38 (H) 05/26/2020 1149   CALCIUM 9.3 11/03/2023 0324   PROT 5.2 (L) 11/03/2023 0324   PROT 7.0 04/27/2023 0921   ALBUMIN 1.7 (L) 11/03/2023 0324   ALBUMIN 4.3 04/27/2023 0921   AST 24 11/03/2023 0324   ALT 32 11/03/2023 0324   ALKPHOS 26 (L) 11/03/2023 0324   BILITOT 0.7 11/03/2023 0324   BILITOT 0.4 04/27/2023 0921   GFRNONAA 30 (L) 11/03/2023 0324   GFRNONAA 51 (L) 05/26/2020 1149   GFRAA 60 01/25/2021 0850   GFRAA 60 05/26/2020 1149   Lipase     Component Value Date/Time   LIPASE 105 (H) 11/01/2023 1425       Studies/Results: MR BRAIN WO CONTRAST  Result Date: 11/02/2023 CLINICAL DATA:  Stroke suspected EXAM: MRI HEAD WITHOUT CONTRAST TECHNIQUE: Multiplanar, multiecho pulse sequences of the brain and surrounding structures were obtained without intravenous contrast. COMPARISON:  No prior MRI available, correlation is made with CT head 10/31/2023 FINDINGS: Brain: Large area of restricted diffusion with ADC correlate in the superior right cerebellum (series 5, image 64 and series 7, image 50), with smaller areas of restricted diffusion in the left cerebellum (series 7, images 45-47). These are associated with edema, causing mild mass effect on the posterior right aspect of the pons. The fourth ventricle remains patent. Cortical restricted diffusion with  ADC correlate most prominently in right frontal operculum (series 5, image 78), and more superior right frontal and parietal lobes (series 5, image 89), with the right frontal operculum correlating with the hypodensity noted on 10/31/2023. Additional scattered, smaller areas of restricted diffusion in the bilateral frontal, parietal, and occipital cortex and white matter (series 5, images 70, 74, 78, 82, 85, and 91, for example). These are associated with increased T2 hyperintense signal. The right cerebellar infarct is associated with hemosiderin deposition (series 12, image 20), likely petechial hemorrhage. No other acute  hemorrhage, mass, mass effect, or midline shift. No hydrocephalus or extra-axial collection. Scattered punctate foci of hemosiderin deposition in the bilateral cerebral hemispheres, most prominently the bilateral occipital and parietal lobes, with additional sulcal hemosiderin deposition in the medial left frontal lobe and left parietal lobe (series 12, image 14). Confluent T2 hyperintense signal in the periventricular white matter, likely the sequela of severe chronic small vessel ischemic disease. Vascular: Normal arterial flow voids. Skull and upper cervical spine: Normal marrow signal. Sinuses/Orbits: Clear paranasal sinuses. Status post bilateral lens replacements. Other: The mastoids are well aerated. IMPRESSION: 1. Large acute infarct in the superior right cerebellum, with smaller acute infarcts in the left cerebellum. The right cerebellar infarct is associated with petechial hemorrhage. 2. Cortical acute infarct in the right frontal operculum correlates with the hypodensity suspected on the 10/31/2023 CT. Additional primarily cortical acute infarcts in the bilateral frontal, parietal, and occipital lobes. Given multiple vascular territories, a central embolic etiology is suspected. 3. Scattered punctate foci of hemosiderin deposition in the bilateral cerebral hemispheres, which are nonspecific but concerning for cerebral amyloid angiopathy. These results will be called to the ordering clinician or representative by the Radiologist Assistant, and communication documented in the PACS or [employer]. Electronically Signed   By: Wiliam Ke M.D.   On: 11/02/2023 18:09   DG CHEST PORT 1 VIEW  Result Date: 11/02/2023 CLINICAL DATA:  Encounter for central line placement. EXAM: PORTABLE CHEST 1 VIEW COMPARISON:  11/02/2023 FINDINGS: Right jugular central line has been placed. Jugular central line tip is in the upper right atrium region. Negative for pneumothorax. Again noted is a right arm PICC line with  the tip in the SVC region. Nasogastric tube extends into the abdomen but the tip is beyond the image. Tip of the endotracheal tube is approximately 1.1 cm above the carina. Subtle densities at the right lung base may represent atelectasis. Otherwise, no focal lung disease. Negative for a pneumothorax. IMPRESSION: 1. Right jugular central line with the tip in the upper right atrium region. Negative for pneumothorax. 2. Endotracheal tube is low lying and just above the carina as described. 3. Subtle densities at the right lung base could represent atelectasis. Electronically Signed   By: Richarda Overlie M.D.   On: 11/02/2023 16:52   DG CHEST PORT 1 VIEW  Result Date: 11/02/2023 CLINICAL DATA:  PICC (peripherally inserted central catheter) flush [id number] EXAM: PORTABLE CHEST 1 VIEW COMPARISON:  11/01/2023 FINDINGS: Right arm PICC line appears stable. The tip is in the SVC region. Endotracheal tube tip is near the carina. Nasogastric tube extends into the abdomen but the tip is beyond the image. Slightly low lung volumes without focal disease. No pulmonary edema. Negative for a pneumothorax. IMPRESSION: 1. Right arm PICC line appears stable. Tip is in the SVC region. 2. Endotracheal tube tip is near the carina. 3. No focal lung disease. Electronically Signed   By: Richarda Overlie M.D.   On: 11/02/2023  16:49   US Abdomen Limited RUQ (LIVER/GB)  Result Date: 11/02/2023 CLINICAL DATA:  Abdominal pain. EXAM: ULTRASOUND ABDOMEN LIMITED RIGHT UPPER QUADRANT COMPARISON:  October 31, 2023. FINDINGS: Gallbladder: No gallstones or wall thickening visualized. No sonographic Murphy sign noted by sonographer. Mild amount of sludge is noted within gallbladder lumen. Common bile duct: Diameter: 4 mm which is within normal limits. Liver: No focal lesion identified. Within normal limits in parenchymal echogenicity. Portal vein is patent on color Doppler imaging with normal direction of blood flow towards the liver. Other: Minimal ascites  is noted. IMPRESSION: Small amount of sludge present within gallbladder lumen. Minimal ascites. Electronically Signed   By: Lupita Raider M.D.   On: 11/02/2023 15:30   DG Abd 1 View  Result Date: 11/02/2023 CLINICAL DATA:  [id number] Encounter for orogastric (OG) tube placement [id number] EXAM: ABDOMEN - 1 VIEW COMPARISON:  X-ray abdomen 11/01/2023 FINDINGS: Enteric tube courses below the hemidiaphragm with tip and side port overlying the expected region of the gastric lumen. PO contrast noted within the colon. The bowel gas pattern is normal. No radio-opaque calculi or other significant radiographic abnormality are seen. IMPRESSION: Enteric tube in good position. Electronically Signed   By: Tish Frederickson M.D.   On: 11/02/2023 02:06   DG Chest Port 1 View  Result Date: 11/01/2023 CLINICAL DATA:  Endotracheal tube. EXAM: PORTABLE CHEST 1 VIEW COMPARISON:  Same day. FINDINGS: Endotracheal and nasogastric tubes are in grossly good position. Mild right basilar atelectasis or infiltrate is noted. Right-sided PICC line is unchanged. IMPRESSION: Endotracheal and nasogastric tubes are in grossly good position. Mild right basilar atelectasis or infiltrate. Electronically Signed   By: Lupita Raider M.D.   On: 11/01/2023 17:11   DG Abd 1 View  Result Date: 11/01/2023 CLINICAL DATA:  NG tube placement EXAM: ABDOMEN - 1 VIEW COMPARISON:  10/31/2023 FINDINGS: Limited radiograph of the lower chest and upper abdomen was obtained for the purposes of enteric tube localization. Enteric tube is seen coursing below the diaphragm with distal tip terminating within the expected location of the gastric antrum. IMPRESSION: Enteric tube terminates within the expected location of the gastric antrum. Recommend advancement 5-10 cm. Electronically Signed   By: Duanne Guess D.O.   On: 11/01/2023 14:38    Anti-infectives: Anti-infectives (From admission, onward)    Start     Dose/Rate Route Frequency Ordered Stop   11/02/23  1500  vancomycin (VANCOREADY) IVPB 1250 mg/250 mL  Status:  Discontinued        1,250 mg 166.7 mL/hr over 90 Minutes Intravenous Every 24 hours 11/01/23 1508 11/02/23 1339   11/02/23 1339  vancomycin variable dose per unstable renal function (pharmacist dosing)         Does not apply See admin instructions 11/02/23 1339     11/01/23 1500  vancomycin (VANCOREADY) IVPB 1750 mg/350 mL        1,750 mg 175 mL/hr over 120 Minutes Intravenous  Once 11/01/23 1424 11/01/23 1656   10/31/23 2200  piperacillin-tazobactam (ZOSYN) IVPB 3.375 g        3.375 g 12.5 mL/hr over 240 Minutes Intravenous Every 8 hours 10/31/23 1753     10/26/23 1400  ceFAZolin (ANCEF) IVPB 2g/100 mL premix        2 g 200 mL/hr over 30 Minutes Intravenous On call to O.R. 10/26/23 1341 10/26/23 1558   10/26/23 1343  ceFAZolin (ANCEF) 2-4 GM/100ML-% IVPB       Note to Pharmacy: Sherren Kerns H:  cabinet override      10/26/23 1343 10/26/23 1559        Assessment/Plan POD#8 s/p Gastrojejunostomy 10/26/23 by Dr. Robyne Peers for GOO secondary to PUD stricture/duodenitis and portal vein thrombosis - CT a/p 11/13 shows small amount of free air but no free fluid to indicate a leak or other intraabdominal issue; radiology reports heterogeneous masslike density in the region of the first and second portion of the duodenum with some new central fluid areas of attenuation that may represent hematoma, mass and/or infection. The stomach is markedly dilated.  - NGT output down yesterday, but still bilious in nature.  Cont NPO and nothing down tube until we can see persistent evidence of gastric emptying as stomach is likely not emptying through GJ well yet - [place] of gb normal.   ID - zosyn, vancomycin VTE - heparin gtt FEN - IVF, NPO/OGT to LIWS. TPN Foley - none   AMS/acute stroke Shock VDRF DM HTN HLD CAD COPD IgG MGUS  I reviewed CCM notes, last 24 h vitals and pain scores, last 48 h intake and output, last 24 h labs and trends,  and last 24 h imaging results.    LOS: 11 days    Letha Cape, [facility] Surgery 11/03/2023, 8:50 AM Please see Amion for pager number during day hours 7:00am-4:30pm

## 2023-11-03 NOTE — Progress Notes (Signed)
PHARMACY - ANTICOAGULATION Pharmacy Consult for heparin Indication:  VTE treatment   Allergies  Allergen Reactions   Asa [Aspirin] Other (See Comments)    Stomach pain   Crestor [Rosuvastatin] Other (See Comments)    Stomach pain   Morphine Nausea And Vomiting   Wellbutrin [Bupropion] Other (See Comments)    Stomach pain   Zoloft [Sertraline Hcl] Other (See Comments)    Stomach pain    Patient Measurements: Height: 6' (182.9 cm) Weight: 84.7 kg (186 lb 11.7 oz) IBW/kg (Calculated) : 77.6 Heparin Dosing Weight: 88 kg  Vital Signs: Temp: 98.6 F (37 C) (11/16 0302) Temp Source: Oral (11/16 0302) BP: 110/76 (11/16 0700) Pulse Rate: 83 (11/16 0700)  Labs: Recent Labs    11/01/23 1425 11/01/23 1515 11/02/23 0339 11/02/23 0734 11/02/23 1506 11/02/23 2109 11/03/23 0324  HGB 9.7*  --  10.8* 10.9*  --   --  8.2*  HCT 33.5*  --  34.7* 32.0*  --   --  25.5*  PLT 83*  --  90*  --   --   --  55*  HEPARINUNFRC  --   --  0.97*  --  0.63  --  0.37  CREATININE  --    < > 3.28*  --   --  2.51* 2.25*   < > = values in this interval not displayed.    Estimated Creatinine Clearance: 32.1 mL/min (A) (by C-G formula based on SCr of 2.25 mg/dL (H)).  Assessment: 72 y.o. male with possible PE for heparin.  Ultrasound negative for portal vein thrombosis. CBC WNL  Heparin level at goal this morning on 1950 units/hr. Overnight events noted, patient with hypoxia and moved to SDU for increased monitoring. Issue with TPN per previous pharmacist and TPN was stopped. Also reported heparin was off most of the night due to above events. Appears to be infusing now. No bleeding issues noted. CBC has remained stable.   11/16 AM: heparin level 0.37 (therapeutic, at lower PTT as recommended per Neurology) on heparin 1550 units/hr. No issues with the infusion or bleeding reported per RN.  Neuro concerned with risk of bleed given large cerebral infarct with some petechial hemorrhage and imaging  consistent with some chronic microhemorrhages. Keeping at low goal of 0.3 to 0.5 for now.    Goal of Therapy:  Heparin level 0.3 to 0.5 units/ml Monitor platelets by anticoagulation protocol: Yes   Plan:  Continue heparin at 1550 units/hr Daily HL and CBC while on therapy.   Link Snuffer, PharmD, BCPS, BCCCP Please refer to [facility] for [facility] numbers 11/03/2023 7:42 AM

## 2023-11-04 ENCOUNTER — Inpatient Hospital Stay (HOSPITAL_COMMUNITY): Payer: 59

## 2023-11-04 DIAGNOSIS — I81 Portal vein thrombosis: Secondary | ICD-10-CM | POA: Diagnosis not present

## 2023-11-04 DIAGNOSIS — J9601 Acute respiratory failure with hypoxia: Secondary | ICD-10-CM | POA: Diagnosis not present

## 2023-11-04 LAB — CBC
HCT: 27.5 % — ABNORMAL LOW (ref 39.0–52.0)
Hemoglobin: 8.9 g/dL — ABNORMAL LOW (ref 13.0–17.0)
MCH: 27.5 pg (ref 26.0–34.0)
MCHC: 32.4 g/dL (ref 30.0–36.0)
MCV: 84.9 fL (ref 80.0–100.0)
Platelets: 37 10*3/uL — ABNORMAL LOW (ref 150–400)
RBC: 3.24 MIL/uL — ABNORMAL LOW (ref 4.22–5.81)
RDW: 16.6 % — ABNORMAL HIGH (ref 11.5–15.5)
WBC: 11.8 10*3/uL — ABNORMAL HIGH (ref 4.0–10.5)
nRBC: 2 % — ABNORMAL HIGH (ref 0.0–0.2)

## 2023-11-04 LAB — COMPREHENSIVE METABOLIC PANEL
ALT: 27 U/L (ref 0–44)
AST: 20 U/L (ref 15–41)
Albumin: 1.8 g/dL — ABNORMAL LOW (ref 3.5–5.0)
Alkaline Phosphatase: 34 U/L — ABNORMAL LOW (ref 38–126)
Anion gap: 10 (ref 5–15)
BUN: 82 mg/dL — ABNORMAL HIGH (ref 8–23)
CO2: 20 mmol/L — ABNORMAL LOW (ref 22–32)
Calcium: 10.2 mg/dL (ref 8.9–10.3)
Chloride: 105 mmol/L (ref 98–111)
Creatinine, Ser: 1.67 mg/dL — ABNORMAL HIGH (ref 0.61–1.24)
GFR, Estimated: 43 mL/min — ABNORMAL LOW (ref >60)
Glucose, Bld: 229 mg/dL — ABNORMAL HIGH (ref 70–99)
Potassium: 3 mmol/L — ABNORMAL LOW (ref 3.5–5.1)
Sodium: 135 mmol/L (ref 135–145)
Total Bilirubin: 0.6 mg/dL (ref <1.2)
Total Protein: 5.7 g/dL — ABNORMAL LOW (ref 6.5–8.1)

## 2023-11-04 LAB — GLUCOSE, CAPILLARY
Glucose-Capillary: 221 mg/dL — ABNORMAL HIGH (ref 70–99)
Glucose-Capillary: 209 mg/dL — ABNORMAL HIGH (ref 70–99)
Glucose-Capillary: 250 mg/dL — ABNORMAL HIGH (ref 70–99)
Glucose-Capillary: 210 mg/dL — ABNORMAL HIGH (ref 70–99)
Glucose-Capillary: 187 mg/dL — ABNORMAL HIGH (ref 70–99)

## 2023-11-04 LAB — CULTURE, RESPIRATORY W GRAM STAIN: Culture: NORMAL

## 2023-11-04 LAB — MAGNESIUM: Magnesium: 2.4 mg/dL (ref 1.7–2.4)

## 2023-11-04 LAB — PHOSPHORUS
Phosphorus: 1.4 mg/dL — ABNORMAL LOW (ref 2.5–4.6)
Phosphorus: 2.4 mg/dL — ABNORMAL LOW (ref 2.5–4.6)

## 2023-11-04 LAB — HEPARIN LEVEL (UNFRACTIONATED): Heparin Unfractionated: 0.27 [IU]/mL — ABNORMAL LOW (ref 0.30–0.70)

## 2023-11-04 LAB — APTT
aPTT: 55 s — ABNORMAL HIGH (ref 24–36)
aPTT: 61 s — ABNORMAL HIGH (ref 24–36)

## 2023-11-04 MED ORDER — TRACE MINERALS CU-MN-SE-ZN 300-55-60-3000 MCG/ML IV SOLN
INTRAVENOUS | Status: AC
  Filled 2023-11-04 (×2): qty 2000

## 2023-11-04 MED ORDER — FENTANYL CITRATE PF 50 MCG/ML IJ SOSY
25.0000 ug | PREFILLED_SYRINGE | INTRAMUSCULAR | Status: DC | PRN
  Administered 2023-11-05: 25 ug via INTRAVENOUS

## 2023-11-04 MED ORDER — ARGATROBAN 50 MG/50ML IV SOLN
0.5000 ug/kg/min | INTRAVENOUS | Status: DC
  Administered 2023-11-04 – 2023-11-08 (×6): 0.5 ug/kg/min via INTRAVENOUS
  Filled 2023-11-04 (×7): qty 50

## 2023-11-04 MED ORDER — FAT EMUL FISH OIL/PLANT BASED 20% (SMOFLIPID)IV EMUL
250.0000 mL | INTRAVENOUS | Status: AC
  Administered 2023-11-04: 250 mL via INTRAVENOUS
  Filled 2023-11-04: qty 250

## 2023-11-04 MED ORDER — INSULIN GLARGINE-YFGN 100 UNIT/ML ~~LOC~~ SOLN
40.0000 [IU] | Freq: Two times a day (BID) | SUBCUTANEOUS | Status: DC
Start: 2023-11-04 — End: 2023-11-04
  Filled 2023-11-04 (×2): qty 0.4

## 2023-11-04 MED ORDER — FENTANYL 2500MCG IN NS 250ML (10MCG/ML) PREMIX INFUSION
25.0000 ug/h | INTRAVENOUS | Status: DC
  Administered 2023-11-04: 25 ug/h via INTRAVENOUS
  Filled 2023-11-04: qty 250

## 2023-11-04 MED ORDER — FENTANYL CITRATE PF 50 MCG/ML IJ SOSY
25.0000 ug | PREFILLED_SYRINGE | INTRAMUSCULAR | Status: DC | PRN
  Administered 2023-11-05 – 2023-11-06 (×3): 100 ug via INTRAVENOUS

## 2023-11-04 MED ORDER — FENTANYL CITRATE PF 50 MCG/ML IJ SOSY
25.0000 ug | PREFILLED_SYRINGE | Freq: Once | INTRAMUSCULAR | Status: AC
Start: 2023-11-04 — End: 2023-11-04
  Administered 2023-11-04: 25 ug via INTRAVENOUS
  Filled 2023-11-04: qty 1

## 2023-11-04 MED ORDER — SODIUM CHLORIDE 0.9% FLUSH
3.0000 mL | INTRAVENOUS | Status: DC | PRN
Start: 2023-11-04 — End: 2023-11-04

## 2023-11-04 MED ORDER — SODIUM CHLORIDE 0.9 % IV SOLN
250.0000 mL | INTRAVENOUS | Status: DC | PRN
Start: 2023-11-04 — End: 2023-11-04

## 2023-11-04 MED ORDER — SODIUM CHLORIDE 0.9% FLUSH
3.0000 mL | Freq: Two times a day (BID) | INTRAVENOUS | Status: DC
  Administered 2023-11-04: 3 mL via INTRAVENOUS

## 2023-11-04 MED ORDER — POLYETHYLENE GLYCOL 3350 17 G PO PACK
17.0000 g | PACK | Freq: Every day | ORAL | Status: DC
  Administered 2023-11-04: 17 g
  Filled 2023-11-04 (×2): qty 1

## 2023-11-04 MED ORDER — INSULIN GLARGINE-YFGN 100 UNIT/ML ~~LOC~~ SOLN
45.0000 [IU] | Freq: Two times a day (BID) | SUBCUTANEOUS | Status: DC
  Administered 2023-11-04 – 2023-11-05 (×3): 45 [IU] via SUBCUTANEOUS
  Filled 2023-11-04 (×4): qty 0.45

## 2023-11-04 MED ORDER — POTASSIUM PHOSPHATES 15 MMOLE/5ML IV SOLN
45.0000 mmol | Freq: Once | INTRAVENOUS | Status: AC
  Administered 2023-11-04: 45 mmol via INTRAVENOUS
  Filled 2023-11-04: qty 15

## 2023-11-04 MED ORDER — DOCUSATE SODIUM 50 MG/5ML PO LIQD
100.0000 mg | Freq: Two times a day (BID) | ORAL | Status: DC
  Administered 2023-11-04 – 2023-11-05 (×2): 100 mg
  Filled 2023-11-04 (×3): qty 10

## 2023-11-04 NOTE — Progress Notes (Signed)
Patient ID: Matthew Molina, male   DOB: June 06, 1951, 72 y.o.   MRN: [id number] [facility] Surgery Progress Note  9 Days Post-Op  Subjective: On vent.  Sedated today, but does wince to pain.  NGT clogged.  Flushed and over 1L of bilious output removed from stomach  Objective: Vital signs in last 24 hours: Temp:  [98 F (36.7 C)-99.3 F (37.4 C)] 98 F (36.7 C) (11/17 0747) Pulse Rate:  [93-127] 107 (11/17 0758) Resp:  [15-32] 24 (11/17 0758) BP: (87-149)/(60-94) 109/73 (11/17 0715) SpO2:  [93 %-99 %] 97 % (11/17 0758) FiO2 (%):  [40 %-50 %] 50 % (11/17 0758) Last BM Date : 11/01/23  Intake/Output from previous day: 11/16 0701 - 11/17 0700 In: 3027.1 [I.V.:2656.9; IV Piggyback:370.2] Out: 3600 [Urine:2900; Emesis/NG output:200; Stool:500] Intake/Output this shift: No intake/output data recorded.  PE: General: ill appearing male on the vent HEENT: OG present with bilious output. ETT in place  Heart: tachycardic  Lungs: mechanically ventilated Abd: soft, mild distension, bowel sounds present, midline incision cdi with staples present and no erythema or drainage. Winces some to palpation  Lab Results:  Recent Labs    11/03/23 0324 11/04/23 0325  WBC 12.5* 11.8*  HGB 8.2* 8.9*  HCT 25.5* 27.5*  PLT 55* 37*   BMET Recent Labs    11/03/23 0324 11/04/23 0325  NA 132* 135  K 3.5 3.0*  CL 103 105  CO2 19* 20*  GLUCOSE 348* 229*  BUN 88* 82*  CREATININE 2.25* 1.67*  CALCIUM 9.3 10.2   PT/INR No results for input(s): "LABPROT", "INR" in the last 72 hours. CMP     Component Value Date/Time   NA 135 11/04/2023 0325   NA 140 10/12/2023 1607   K 3.0 (L) 11/04/2023 0325   CL 105 11/04/2023 0325   CO2 20 (L) 11/04/2023 0325   GLUCOSE 229 (H) 11/04/2023 0325   BUN 82 (H) 11/04/2023 0325   BUN 15 10/12/2023 1607   CREATININE 1.67 (H) 11/04/2023 0325   CREATININE 1.38 (H) 05/26/2020 1149   CALCIUM 10.2 11/04/2023 0325   PROT 5.7 (L) 11/04/2023 0325   PROT 7.0  04/27/2023 0921   ALBUMIN 1.8 (L) 11/04/2023 0325   ALBUMIN 4.3 04/27/2023 0921   AST 20 11/04/2023 0325   ALT 27 11/04/2023 0325   ALKPHOS 34 (L) 11/04/2023 0325   BILITOT 0.6 11/04/2023 0325   BILITOT 0.4 04/27/2023 0921   GFRNONAA 43 (L) 11/04/2023 0325   GFRNONAA 51 (L) 05/26/2020 1149   GFRAA 60 01/25/2021 0850   GFRAA 60 05/26/2020 1149   Lipase     Component Value Date/Time   LIPASE 105 (H) 11/01/2023 1425       Studies/Results: CT HEAD WO CONTRAST ( )  Result Date: 11/04/2023 CLINICAL DATA:  Stroke follow-up EXAM: CT HEAD WITHOUT CONTRAST TECHNIQUE: Contiguous axial images were obtained from the base of the skull through the vertex without intravenous contrast. RADIATION DOSE REDUCTION: This exam was performed according to the departmental dose-optimization program which includes automated exposure control, adjustment of the mA and/or kV according to patient size and/or use of iterative reconstruction technique. COMPARISON:  Brain MRI from 2 days ago FINDINGS: Brain: Multifocal acute infarction in the right cerebellum, most confluent in the right superior cerebellum. Small scattered cortical infarcts along the bilateral cerebral convexity, most confluent at the right frontal operculum. No gross progression or hemorrhage. No hydrocephalus or shift. Vascular: No hyperdense vessel or unexpected calcification. Skull: No acute finding Sinuses/Orbits: Motion artifact.  IMPRESSION: Multiple acute infarcts. No hemorrhagic conversion or gross progression. The largest is in the upper right cerebellum, no hydrocephalus. Motion artifact. Electronically Signed   By: Tiburcio Pea M.D.   On: 11/04/2023 06:04   MR BRAIN WO CONTRAST  Result Date: 11/02/2023 CLINICAL DATA:  Stroke suspected EXAM: MRI HEAD WITHOUT CONTRAST TECHNIQUE: Multiplanar, multiecho pulse sequences of the brain and surrounding structures were obtained without intravenous contrast. COMPARISON:  No prior MRI available,  correlation is made with CT head 10/31/2023 FINDINGS: Brain: Large area of restricted diffusion with ADC correlate in the superior right cerebellum (series 5, image 64 and series 7, image 50), with smaller areas of restricted diffusion in the left cerebellum (series 7, images 45-47). These are associated with edema, causing mild mass effect on the posterior right aspect of the pons. The fourth ventricle remains patent. Cortical restricted diffusion with ADC correlate most prominently in right frontal operculum (series 5, image 78), and more superior right frontal and parietal lobes (series 5, image 89), with the right frontal operculum correlating with the hypodensity noted on 10/31/2023. Additional scattered, smaller areas of restricted diffusion in the bilateral frontal, parietal, and occipital cortex and white matter (series 5, images 70, 74, 78, 82, 85, and 91, for example). These are associated with increased T2 hyperintense signal. The right cerebellar infarct is associated with hemosiderin deposition (series 12, image 20), likely petechial hemorrhage. No other acute hemorrhage, mass, mass effect, or midline shift. No hydrocephalus or extra-axial collection. Scattered punctate foci of hemosiderin deposition in the bilateral cerebral hemispheres, most prominently the bilateral occipital and parietal lobes, with additional sulcal hemosiderin deposition in the medial left frontal lobe and left parietal lobe (series 12, image 14). Confluent T2 hyperintense signal in the periventricular white matter, likely the sequela of severe chronic small vessel ischemic disease. Vascular: Normal arterial flow voids. Skull and upper cervical spine: Normal marrow signal. Sinuses/Orbits: Clear paranasal sinuses. Status post bilateral lens replacements. Other: The mastoids are well aerated. IMPRESSION: 1. Large acute infarct in the superior right cerebellum, with smaller acute infarcts in the left cerebellum. The right cerebellar  infarct is associated with petechial hemorrhage. 2. Cortical acute infarct in the right frontal operculum correlates with the hypodensity suspected on the 10/31/2023 CT. Additional primarily cortical acute infarcts in the bilateral frontal, parietal, and occipital lobes. Given multiple vascular territories, a central embolic etiology is suspected. 3. Scattered punctate foci of hemosiderin deposition in the bilateral cerebral hemispheres, which are nonspecific but concerning for cerebral amyloid angiopathy. These results will be called to the ordering clinician or representative by the Radiologist Assistant, and communication documented in the PACS or [employer]. Electronically Signed   By: Wiliam Ke M.D.   On: 11/02/2023 18:09   DG CHEST PORT 1 VIEW  Result Date: 11/02/2023 CLINICAL DATA:  Encounter for central line placement. EXAM: PORTABLE CHEST 1 VIEW COMPARISON:  11/02/2023 FINDINGS: Right jugular central line has been placed. Jugular central line tip is in the upper right atrium region. Negative for pneumothorax. Again noted is a right arm PICC line with the tip in the SVC region. Nasogastric tube extends into the abdomen but the tip is beyond the image. Tip of the endotracheal tube is approximately 1.1 cm above the carina. Subtle densities at the right lung base may represent atelectasis. Otherwise, no focal lung disease. Negative for a pneumothorax. IMPRESSION: 1. Right jugular central line with the tip in the upper right atrium region. Negative for pneumothorax. 2. Endotracheal tube is low lying  and just above the carina as described. 3. Subtle densities at the right lung base could represent atelectasis. Electronically Signed   By: Richarda Overlie M.D.   On: 11/02/2023 16:52   DG CHEST PORT 1 VIEW  Result Date: 11/02/2023 CLINICAL DATA:  PICC (peripherally inserted central catheter) flush [id number] EXAM: PORTABLE CHEST 1 VIEW COMPARISON:  11/01/2023 FINDINGS: Right arm PICC line appears stable.  The tip is in the SVC region. Endotracheal tube tip is near the carina. Nasogastric tube extends into the abdomen but the tip is beyond the image. Slightly low lung volumes without focal disease. No pulmonary edema. Negative for a pneumothorax. IMPRESSION: 1. Right arm PICC line appears stable. Tip is in the SVC region. 2. Endotracheal tube tip is near the carina. 3. No focal lung disease. Electronically Signed   By: Richarda Overlie M.D.   On: 11/02/2023 16:49   US Abdomen Limited RUQ (LIVER/GB)  Result Date: 11/02/2023 CLINICAL DATA:  Abdominal pain. EXAM: ULTRASOUND ABDOMEN LIMITED RIGHT UPPER QUADRANT COMPARISON:  October 31, 2023. FINDINGS: Gallbladder: No gallstones or wall thickening visualized. No sonographic Murphy sign noted by sonographer. Mild amount of sludge is noted within gallbladder lumen. Common bile duct: Diameter: 4 mm which is within normal limits. Liver: No focal lesion identified. Within normal limits in parenchymal echogenicity. Portal vein is patent on color Doppler imaging with normal direction of blood flow towards the liver. Other: Minimal ascites is noted. IMPRESSION: Small amount of sludge present within gallbladder lumen. Minimal ascites. Electronically Signed   By: Lupita Raider M.D.   On: 11/02/2023 15:30    Anti-infectives: Anti-infectives (From admission, onward)    Start     Dose/Rate Route Frequency Ordered Stop   11/02/23 1500  vancomycin (VANCOREADY) IVPB 1250 mg/250 mL  Status:  Discontinued        1,250 mg 166.7 mL/hr over 90 Minutes Intravenous Every 24 hours 11/01/23 1508 11/02/23 1339   11/02/23 1339  vancomycin variable dose per unstable renal function (pharmacist dosing)  Status:  Discontinued         Does not apply See admin instructions 11/02/23 1339 11/03/23 1501   11/01/23 1500  vancomycin (VANCOREADY) IVPB 1750 mg/350 mL        1,750 mg 175 mL/hr over 120 Minutes Intravenous  Once 11/01/23 1424 11/01/23 1656   10/31/23 2200  piperacillin-tazobactam  (ZOSYN) IVPB 3.375 g        3.375 g 12.5 mL/hr over 240 Minutes Intravenous Every 8 hours 10/31/23 1753     10/26/23 1400  ceFAZolin (ANCEF) IVPB 2g/100 mL premix        2 g 200 mL/hr over 30 Minutes Intravenous On call to O.R. 10/26/23 1341 10/26/23 1558   10/26/23 1343  ceFAZolin (ANCEF) 2-4 GM/100ML-% IVPB       Note to Pharmacy: Sherren Kerns H: cabinet override      10/26/23 1343 10/26/23 1559        Assessment/Plan POD#9 s/p Gastrojejunostomy 10/26/23 by Dr. Robyne Peers for GOO secondary to PUD stricture/duodenitis and portal vein thrombosis - CT a/p 11/13 shows small amount of free air but no free fluid to indicate a leak or other intraabdominal issue; radiology reports heterogeneous masslike density in the region of the first and second portion of the duodenum with some new central fluid areas of attenuation that may represent hematoma, mass and/or infection. The stomach is markedly dilated.  - OGT output still high.  Tube is intermittently getting clogged.  I just drained 1L  from stomach after flushing this morning.  Cont NPO and nothing down tube until we can see persistent evidence of gastric emptying as stomach is likely not emptying through GJ well yet -once extubated would need OG converted to NGT as of right now. - [place] of gb normal.   ID - zosyn, vancomycin VTE - heparin gtt on hold due to severe thrombocytopenia, HIT panel pending FEN - IVF, NPO/OGT to LIWS. TPN Foley - none   AMS/acute stroke Shock VDRF DM HTN HLD CAD COPD IgG MGUS  I reviewed CCM notes, last 24 h vitals and pain scores, last 48 h intake and output, last 24 h labs and trends, and last 24 h imaging results.    LOS: 12 days    Letha Cape, [facility] Surgery 11/04/2023, 8:22 AM Please see Amion for pager number during day hours 7:00am-4:30pm

## 2023-11-04 NOTE — Progress Notes (Signed)
Pt found on 5/5; pt w/ RR in the upper 30s and significantly increased WOB; pt placed back on prior settings.

## 2023-11-04 NOTE — Progress Notes (Addendum)
Pharmacy Heparin Induced Thrombocytopenia (HIT) Note:  Matthew Molina is an 72 y.o. male being evaluated for HIT. Heparin was started 11/5 for portal vein thrombosis and possible PE, and baseline platelets were 332.   HIT labs were ordered on 11/17 when platelets dropped to 37.  Auto-populate labs: No results found for: "HEPINDPLTAB", "SRALOWDOSEHP", "SRAHIGHDOSEH"   CALCULATE SCORE:  4Ts (see the HIT Algorithm) Score  Thrombocytopenia 2  Timing 1  Thrombosis 2  Other causes of thrombocytopenia 1  Total 6     Recommendations (A or B) are based on available lab results (HIT antibody and/or SRA) and the HIT algorithm    A. HIT antibody result available  Possible HIT   Order SRA:  Yes Discontinue heparin / LMWH:  Yes Initiate alternative anticoagulation:  Yes Document heparin allergy:  Yes   B. SRA result availability  SRA not available  Plan (Discussed with provider) Labs ordered:  SRA not needed  - wait for heparin ab to return Heparin allergy:  Heparin allergy documented or updated. Anticoagulation plans:  Begin alternative anticoagulation with argatroban.  Plan: Initiate argatroban at 0.5 mcg/kg/min Initial aptt at 1200 Q4h aptt until therapeutic x 2 (goal aptt 50 - 90s)  Elmer Sow, PharmD, BCCCP Clinical Pharmacist [phone]  Please check AMION for all [facility] numbers  11/04/2023 7:45 AM  12:53 PM Initial appt within goal 55s - will repeat in 4 hrs per protocol

## 2023-11-04 NOTE — Progress Notes (Signed)
eLink Physician-Brief Progress Note Patient Name: Matthew Molina DOB: 1951-04-18 MRN: [id number]   Date of Service  11/04/2023  HPI/Events of Note  Bedside RN reports Vent dyssynchrony with patient getting fentanyl 100 mcg every hour as needed.   eICU Interventions  Video assessment of patient done.  He is resting comfortably and has no vent dyssynchrony at this time.  Will continue to monitor on current dose of fentanyl.        Carilyn Goodpasture 11/04/2023, 3:56 AM

## 2023-11-04 NOTE — Progress Notes (Signed)
PHARMACY - TOTAL PARENTERAL NUTRITION CONSULT NOTE  Indication:  Gastric outlet obstruction  Patient Measurements: Height: 6' (182.9 cm) Weight: 84.7 kg (186 lb 11.7 oz) IBW/kg (Calculated) : 77.6 TPN AdjBW (KG): 87.2 Body mass index is 25.33 kg/m.  Assessment:  25 YOM with GOO from PUD stricture/duodenitis that is preventing him from passing any food or drink, causing abdominal distention, pain, N/V.  Underwent gastrojejunostomy on 11/8 and diet gradually advanced to full liquid on 11/13.   UGI showed inability to pass through gastrojejunostomy.  He was on TPN 11/8 >> 11/13.    Patient was transferred to the ICU on 11/13 due to AMS and apnea.  CT showed AIS and free air in abdomen, dilated gallbladder, possible cholecystitis and possible hematoma/mass/infection. CCS think gallbladder is unremarkable.  Patient was transferred to [facility] on 11/14.  Pharmacy consulted to resume TPN.  Glucose / Insulin: hx DM2 - CBGs elevated in the 200s Stress steroid initiated 11/14 > reduce to daily 11/17 (CCM plans to taper steroid over one week once off pressor) Insulin gtt briefly 11/14  Used 46 units SSI in the past 24 hrs and Semglee 35 units BID Electrolytes: K down to 3 post 2 runs, low CO2, Phos down to 1.4, CoCa elevated at 11.92, others WNL Renal: SCr down 1.67, BUN down to 82 Hepatic: LFTs / tbili / TG WNL, albumin 1.7 Intake / Output; MIVF: UOP 1.4 ml/kg/hr, last Lasix 11/14, NG (bilious), stool GI Imaging: 11/6 EGD: showed distal duodenal obstruction 11/11 upper GI: contrast was not moving down GJ 11/12 KUB: contrast within small bowel and colon 11/13 CT: free air in abdomen, dilated gallbladder, possible cholecystitis and possible hematoma/mass/infection 11/15 abd U/S: small amount of sludge within gallbladder, minimal ascites GI Surgeries / Procedures: none since TPN initiation   Central access: PICC placed 10/26/23 TPN start date: 10/26/23 >> 10/31/23, restarting 11/15  >>  Nutritional Goals: RD Estimated Needs Total Energy Estimated Needs: 2300-2600 Total Protein Estimated Needs: 130-145 gm Total Fluid Estimated Needs: 2.3-2.6 L  Current Nutrition:  TPN  Plan:  Unable to meet needs given limitations of pre-mixed Clinimix Continue Clinimix 8/10 at 82 ml/hr and SMOFlipid at 20.8 ml/hr x 12 hrs to provide 1798 kCal and 157g AA, meeting 78% of kCal and exceeding AA goal by 16%.  Monitor BUN closely. Add standard MVI and trace elements to TPN Continue resistant SSI Q4H and increase Semglee to 45 units SQ BID  KPhos IV per MD Monitor TPN labs on Mon/Thurs - labs in AM (monitor Mag/Ca and switch to Clinimix E as able)  Matthew Molina D. Laney Potash, PharmD, BCPS, BCCCP 11/04/2023, 8:12 AM

## 2023-11-04 NOTE — Progress Notes (Signed)
Daughter updated.  Agreeable to trach later in week Our team will reach out again tomorrow to discuss further plans

## 2023-11-04 NOTE — Plan of Care (Signed)
  Problem: Clinical Measurements: Goal: Respiratory complications will improve Outcome: Progressing   Problem: Nutrition: Goal: Adequate nutrition will be maintained Outcome: Progressing   Problem: Elimination: Goal: Will not experience complications related to bowel motility Outcome: Progressing   Problem: Pain Management: Goal: General experience of comfort will improve Outcome: Progressing   Problem: Skin Integrity: Goal: Risk for impaired skin integrity will decrease Outcome: Progressing

## 2023-11-04 NOTE — Progress Notes (Signed)
Pt transported on 1005 O2 w/vent settings as per flowsheet to CT and return to 28M without incidence. Pt tol well.

## 2023-11-04 NOTE — Progress Notes (Signed)
eLink Physician-Brief Progress Note Patient Name: Matthew Molina DOB: 07/21/1951 MRN: [id number]   Date of Service  11/04/2023  HPI/Events of Note  Pt had Fent drip change to IVP PRN and RR now ^^ 20-30's asking to have drip restarted.  Camera: Discussed with RN. P peak 15.  Failed weaning. On low dose levophed, VS stable, HR ok. RR increased.   Last pH 7.32/32/299 on 40% fio2. In synchrony though.   Planning to go for trach.   eICU Interventions  Fenta drip re started .      Intervention Category Intermediate Interventions: Other: Minor Interventions: Agitation / anxiety - evaluation and management  Ranee Gosselin 11/04/2023, 8:24 PM

## 2023-11-04 NOTE — Progress Notes (Addendum)
STROKE TEAM PROGRESS NOTE   BRIEF HPI [name] is a 72 y.o. male with history of PMH IgG MGUS, COPD, DMT2, HTN, HLD presents to [facility] on 11/5 w/ abd pain, N/V.   He started to have gradual worsening of his abdominal pain over the past week and some nausea vomiting and decreased p.o. intake as well as generalized weakness and presented to [facility] on 10/23/2023.  CT abdomen pelvis with marked distention of stomach at the level of the duodenum and new hypodensity upstream portal vein suspicious for nonocclusive thrombus and partially imaged hypodensity in the right upper lobe pulmonary artery which was artifact versus PE.  Started on heparin.  GI and surgery consulted.  Underwent gastrojejunostomy for concern of gastric outlet obstruction secondary to PUD/duodenitis on 10/26/2023.  Started on TPN.  On 1113, had increased respiratory distress and AMS.  Worsening abdominal pain.  CT head with a hypodensity in the superior right insula but could reflect air throughout the abdomen, new dilated gallbladder with multiple gallstones concerning for cholecystitis.  Surgery was consulted with no intervention.  Patient had increasing her mental status and required intubation.  Also had extensive hypotension requiring Levophed and was transferred to [facility]. MRI was completed that showed multifocal infarcts with the largest 1 being in the right cerebellum concerning for a central etiology. Neurology consulted. Patient unable to provide any history at this time-intubated, on sedation with Precedex and fentanyl.  SIGNIFICANT HOSPITAL EVENTS 11/5 admit to [facility] 11/14 transferred to [facility] for respiratory failure and septic shock. Intubated on levo. Pccm consulted  INTERIM HISTORY/SUBJECTIVE Remains intubated, on precedex. On TPN, agatroban, concern for HIT. Not waking up and not following commands. No family at bedside     OBJECTIVE  CBC    Component Value Date/Time   WBC 11.8 (H)  11/04/2023 0325   RBC 3.24 (L) 11/04/2023 0325   HGB 8.9 (L) 11/04/2023 0325   HGB 9.6 (L) 10/12/2023 1607   HCT 27.5 (L) 11/04/2023 0325   HCT 30.4 (L) 10/12/2023 1607   PLT 37 (L) 11/04/2023 0325   PLT 280 10/12/2023 1607   MCV 84.9 11/04/2023 0325   MCV 90 10/12/2023 1607   MCH 27.5 11/04/2023 0325   MCHC 32.4 11/04/2023 0325   RDW 16.6 (H) 11/04/2023 0325   RDW 14.0 10/12/2023 1607   LYMPHSABS 1.2 11/01/2023 0425   LYMPHSABS 2.1 06/23/2021 1058   MONOABS 1.3 (H) 11/01/2023 0425   EOSABS 0.1 11/01/2023 0425   EOSABS 0.4 06/23/2021 1058   BASOSABS 0.0 11/01/2023 0425   BASOSABS 0.0 06/23/2021 1058    BMET    Component Value Date/Time   NA 135 11/04/2023 0325   NA 140 10/12/2023 1607   K 3.0 (L) 11/04/2023 0325   CL 105 11/04/2023 0325   CO2 20 (L) 11/04/2023 0325   GLUCOSE 229 (H) 11/04/2023 0325   BUN 82 (H) 11/04/2023 0325   BUN 15 10/12/2023 1607   CREATININE 1.67 (H) 11/04/2023 0325   CREATININE 1.38 (H) 05/26/2020 1149   CALCIUM 10.2 11/04/2023 0325   EGFR 59 (L) 10/12/2023 1607   GFRNONAA 43 (L) 11/04/2023 0325   GFRNONAA 51 (L) 05/26/2020 1149    IMAGING past 24 hours CT HEAD WO CONTRAST ( )  Result Date: 11/04/2023 CLINICAL DATA:  Stroke follow-up EXAM: CT HEAD WITHOUT CONTRAST TECHNIQUE: Contiguous axial images were obtained from the base of the skull through the vertex without intravenous contrast. RADIATION DOSE REDUCTION: This exam was performed  according to the departmental dose-optimization program which includes automated exposure control, adjustment of the mA and/or kV according to patient size and/or use of iterative reconstruction technique. COMPARISON:  Brain MRI from 2 days ago FINDINGS: Brain: Multifocal acute infarction in the right cerebellum, most confluent in the right superior cerebellum. Small scattered cortical infarcts along the bilateral cerebral convexity, most confluent at the right frontal operculum. No gross progression or  hemorrhage. No hydrocephalus or shift. Vascular: No hyperdense vessel or unexpected calcification. Skull: No acute finding Sinuses/Orbits: Motion artifact. IMPRESSION: Multiple acute infarcts. No hemorrhagic conversion or gross progression. The largest is in the upper right cerebellum, no hydrocephalus. Motion artifact. Electronically Signed   By: Tiburcio Pea M.D.   On: 11/04/2023 06:04    Vitals:   11/04/23 1100 11/04/23 1115 11/04/23 1130 11/04/23 1139  BP: 113/83 130/88    Pulse: (!) 104 (!) 110  (!) 107  Resp: (!) 26 (!) 33  (!) 30  Temp:   98.8 F (37.1 C)   TempSrc:   Axillary   SpO2: 95% 94%  96%  Weight:      Height:         NEURO: on precedex Mental Status: Intubated, not following commands. Eyes midline with right gaze preference. . Pupil round and reactive to light. Will grimace and attempt to withdraw with the RLE otherwise not withdrawing.    NIHSS: 25   ASSESSMENT/PLAN  Acute Ischemic Infarct:  right cerebellum, left cerebellum, right frontal operculum, bilateral frontal, parietal and occipital lobes. Etiology:  Cardioembolic in the setting of shock and portal vein thrombosis CT head 10/31/2023: 2.3 cm hypodensity seen involving the superior right insula/right frontal operculum.    CTA head & neck  MRI   MRA  Large acute infarct in the superior right cerebellum, with smaller acute infarcts in the left cerebellum. The right cerebellar infarct is associated with petechial hemorrhage 2. Cortical acute infarct in the right frontal operculum correlates with the hypodensity suspected on the 10/31/2023 CT. Additional primarily cortical acute infarcts in the bilateral frontal, parietal, and occipital lobes. Daughter updated via phone on 11/04/23 Will repeat CT head in the morning. Patient with large stroke and on heparin 2D Echo EF > 75% LDL 35 HgbA1c 6.0 VTE prophylaxis - Argatroban Therapy recommendations:  Pending Disposition:  Pending   Hx of Stroke/TIA None    Hypertension Home meds:  None  Stable Blood Pressure Goal: Normotension   Hyperlipidemia Home meds:  None  LDL 35, goal < 70 High intensity statin not indicated   Diabetes type II: None  HgbA1c 6.0, goal < 7.0  Dysphagia Patient has post-stroke dysphagia, SLP consulted    Diet   Diet NPO time specified   Advance diet as tolerated  Other Active Problems Severe duodenal obstruction Portal vein thrombosis   Hospital day # 12   This patient is critically ill due to Large cerebellar stroke, and multiple strokes in the bilateral hemisphere, respiratory failure and portal vein thrombosis and at significant risk of neurological worsening, death form brain bleeding, swelling, brain compression, and also respiratory failure. This patient's care requires constant monitoring of vital signs, hemodynamics, respiratory and cardiac monitoring, review of multiple databases, neurological assessment, discussion with family, other specialists and medical decision making of high complexity. I spent 35 minutes of neurocritical care time in the care of this patient.    Windell Norfolk, MD  Stroke Neurology   To contact Stroke Continuity provider, please refer to [email or web address]. After hours, contact General  Neurology

## 2023-11-04 NOTE — Progress Notes (Signed)
[facility] ADULT ICU REPLACEMENT PROTOCOL   The patient does apply for the [facility] Adult ICU Electrolyte Replacment Protocol based on the criteria listed below:   1.Exclusion criteria: TCTS, ECMO, Dialysis, and Myasthenia Gravis patients 2. Is GFR >/= 30 ml/min? Yes.    Patient's GFR today is 43 3. Is SCr </= 2? Yes.   Patient's SCr is 1.67 mg/dL 4. Did SCr increase >/= 0.5 in 24 hours? No. 5.Pt's weight >40kg  Yes.   6. Abnormal electrolyte(s): K+ 3.0, Phos 1.4  7. Electrolytes replaced per protocol 8.  Call MD STAT for K+ </= 2.5, Phos </= 1, or Mag </= 1 Physician:  Dr Cory Roughen, Jettie Booze 11/04/2023 5:49 AM

## 2023-11-04 NOTE — Progress Notes (Signed)
Pharmacy Heparin Induced Thrombocytopenia (HIT) Note:  Matthew Molina is an 72 y.o. male being evaluated for HIT. Heparin was started 11/5 for portal vein thrombosis and possible PE, and baseline platelets were 332.   HIT labs were ordered on 11/17 when platelets dropped to 37.  Auto-populate labs: No results found for: "HEPINDPLTAB", "SRALOWDOSEHP", "SRAHIGHDOSEH"   CALCULATE SCORE:  4Ts (see the HIT Algorithm) Score  Thrombocytopenia 2  Timing 1  Thrombosis 2  Other causes of thrombocytopenia 1  Total 6     Recommendations (A or B) are based on available lab results (HIT antibody and/or SRA) and the HIT algorithm    A. HIT antibody result available  Possible HIT   Order SRA:  Yes Discontinue heparin / LMWH:  Yes Initiate alternative anticoagulation:  Yes Document heparin allergy:  Yes   B. SRA result availability  SRA not available  Plan (Discussed with provider) Labs ordered:  SRA not needed  - wait for heparin ab to return Heparin allergy:  Heparin allergy documented or updated. Anticoagulation plans:  Begin alternative anticoagulation with argatroban.  Assessment: Pt started on argatroban for HIT r/o as above. Confirmatory aPTT remains within therapeutic range at 61 seconds.   Plan: Initiate argatroban at 0.5 mcg/kg/min Daily aPTT  Fredonia Highland, PharmD, BCPS, [facility] Clinical Pharmacist [phone] Please check AMION for all [facility] Pharmacy numbers 11/04/2023

## 2023-11-04 NOTE — Progress Notes (Addendum)
NAME:  Matthew Molina, MRN:  [id number], DOB:  31-Oct-1951, LOS: 12 ADMISSION DATE:  10/23/2023, CONSULTATION DATE:  11/14 REFERRING MD:  Dr. Arbutus Leas, CHIEF COMPLAINT:  septic shock   History of Present Illness:  Patient is a 72 yo M w/ pertinent PMH IgG MGUS, COPD, DMT2, HTN, HLD presents to [facility] on 11/5 w/ abd pain, N/V.  Patient recently admitted to hospital on 10/13 w/ N/V and abd pain. Treated for aspiration pna. GI performed EGD showing grade B esophagitis, duodenitis, and gastric ulcer nonbleeding. Discharged 10/16 w/ PPI and sucralfate.   Patient began having gradual worsening abd pain over the past week. Also having some N/V and decreased po intake. Generalized weakness. Denies sob, diarrhea, chest pain. Came to [facility] on 11/5. On arrival hemodynamically stable and sats 92% on room air. Lipase 84. CT abd/pelvis w/ contrast marked distention of the stomach to the level of the duodenum where there is circumferential thickening and heterogenous hypoattenuation at the site of the patient's previous duodenitis; new hypodensity upstream portal vein suspicious for nonocclusive thrombus and partially imaged hypodensity in the right upper lobe pulmonary artery which may represent artifact versus PE. Started on heparin. GI and surgery consulted. Patient underwent gastrojejunostomy for concern of gastric outlet obstruction secondary to PUD/duodenitis on 10/26/2023. Patient started on TPN for short period and stopped 11/13.   Overnight on 11/13 patient with increased respiratory distress and AMS. Having some abd pain. CT head 2.3 cm hypodensity involving the superior right insula/right frontal operculum, age indeterminate, but could reflect an evolving acute ischemic infarct.  CT abd/pelvis w/ contrast showing small amount of free air throughout abd; new dilated gallbladder w/ multiple gallstones concerning for cholecystits. Surgery consulted and no intervention at this time. Patient w/ increasing AMS and required  intubation. Patient also w/ hypotension requiring levophed. Plan to transfer to [facility] and PCCM consulted.  Pertinent  Medical History   Past Medical History:  Diagnosis Date   Acute blood loss anemia 01/05/2020   Acute respiratory failure with hypoxia (HCC)    Arthritis    COPD (chronic obstructive pulmonary disease) (HCC)    Depression    Diabetes mellitus    Diabetes mellitus without complication (HCC)    Diverticulitis    Elevated PSA 10/01/2016   Encounter for support and coordination of transition of care 01/14/2020   Hypercholesterolemia    Hyperlipidemia    Hypertension    Insomnia 01/05/2016   Ischemic colitis (HCC) 01/05/2020   Multiple lung nodules on CT 04/02/2015   Nicotine addiction    Obesity    Oxygen deficiency    qhs   Schizophrenia (HCC)    Significant Hospital Events: Including procedures, antibiotic start and stop dates in addition to other pertinent events   11/5 admit to [facility]  Patient underwent gastrojejunostomy for concern of gastric outlet obstruction secondary to PUD/duodenitis on 10/26/2023 11/13 change in MS. Concern for acute stroke  11/14 transferred to [facility] for respiratory failure and septic shock. Intubated on levo. Pccm consulted 11/15 On propofol, fentanyl Requiring vasopressin and norepinephrine Unresponsive, Cough, gag, corneals present 11/16 follow-up CT brain expected findings of acute stroke, no hemorrhage. 11/17: Attempting sedation further.  Heparin stopped due to severe thrombocytopenia HIT panel sent.  Added argatroban  Interim History / Subjective:  Increased sedation overnight.  Changes.  Objective   Blood pressure 112/73, pulse (!) 106, temperature 98.8 F (37.1 C), temperature source Oral, resp. rate (!) 31, height 6' (1.829 m), weight 84.7 kg, SpO2 98%.  Vent Mode: PRVC FiO2 (%):  [40 %] 40 % Set Rate:  [26 bmp] 26 bmp Vt Set:  [620 mL] 620 mL PEEP:  [5 cmH20] 5 cmH20 Pressure Support:  [5 cmH20] 5 cmH20 Plateau Pressure:   [13 cmH20-19 cmH20] 19 cmH20   Intake/Output Summary (Last 24 hours) at 11/04/2023 0707 Last data filed at 11/04/2023 2956 Gross per 24 hour  Intake 2860.9 ml  Output 3600 ml  Net -739.1 ml   Filed Weights   11/01/23 1321 11/02/23 0500 11/03/23 0500  Weight: 83.5 kg 86.4 kg 84.7 kg    Examination: Sedated no overnight HEENT normocephalic atraumatic orally intubated no clear JVD Pulmonary: Scattered rhonchi, tidal volumes in the 400 range when placed on pressure support of 5 no accessory use Cardiac regular rate and rhythm Extremities dependent edema pulses strong Neuro Will open eyes to voice, purposeful or following commands   Resolved Hospital Problem list     Assessment & Plan:   Acute hypoxemic respiratory failure due to aspiration PNA.  -sputum culture re-incubated -cxr aeration improved. Tmax 99.3  wbc improved. Mental status and deconditioning appear to be largest barrier after his stroke Plan PSV as tolerated Cont night time rest on full support VAP bundle PAD protocol RASS 0 to -1  Will speak to family about trach   History of obstructive lung disease Plan Continue Brovana, continue Pulmicort and DuoNeb  Septic shock due to asp PNA and probable bacterial translocation from gut w/ on-going drug induced hypotension  -still on NE but weaning Plan Keep euvolemic volume status Wean Norepi for MAP >65 Cont stress dose steroids. Taper to off over 5-7d after off pressors.  Cont tele  Severe distal duodenal obstruction s/p gastrojejunostomy 11/8 Cholelithiasis -Surgery continues to follow. Abd US obtained 11/15 sm amt sludge no murphy's sign  Plan Abx per above Cont ppi  Keep strict NPO TPN per surgical team  Cont LIWS (once extubated will have to have OGT placed nasal)  Acute metabolic encephalopathy due to sepsis and metabolic acidosis complicated by  Acute ischemic stroke 11/13 w/ f/u imaging showing mult acute infarcts but no hemorrhagic conversion   Plan Cont supportive care  PRN fent  Dex for RASS 0 to -1  Given presence of petechial hemorrhage neuro NOT recommending antiplatelets w/ the heparin  Close obs of neuro status w/ increased risk of ICH PT/OT/rehab svc as able    Portal vein thrombosis Concern for PE Plan Holding heparin given severe thrombocytopenia started argatroban 11/17 Intermittent CBC   Severe thrombocytopenia  Plan Cont to monitor  Discontinue heparin   Anemia of critical illness No current evidence of bleeding Plan Am cbc Trigger for transfusion hgb < 7  Chronic kidney disease stage IIIa Stable & improving Plan Renal dose meds to continue  Strict I&O Am chem   Type II DM w/ Steroid induced hyperglycemia  Still not at goal  Plan Changed basal coverage to semglee 35units bid. Will increased to 45 units BID  Cont resistant ssi  If further up-titration needed will add q 4 hr short acting scheduled   Fluid and electrolyte imbalance: hyponatremia, boarderline hypokalemia, hypophosphatemia, NAGMA Acid base slowly improving Plan Replace K & PO4 Am chem    Hypertension Hyperlipidemia plan Hold antihypertensives at present Continue statin  Depression/anxiety plan Home meds on hold, takes Buspar, risperdal and trazedone at home    Best Practice (right click and "Reselect all SmartList Selections" daily)   Diet/type: NPO-->tubefeeds DVT prophylaxis: SCD heparin discontinued on 11/17  due to thrombocytopenia GI prophylaxis: PPI Lines: N/A Foley:  N/A Code Status:  full code Last date of multidisciplinary goals of care discussion [11/14 updated daughter over phone]   My critical care time 35 min   Simonne Martinet ACNP-BC [facility] Pulmonary/Critical Care Pager # [phone] OR # [phone] if no answer

## 2023-11-05 ENCOUNTER — Telehealth: Payer: Self-pay | Admitting: Acute Care

## 2023-11-05 DIAGNOSIS — I81 Portal vein thrombosis: Secondary | ICD-10-CM | POA: Diagnosis not present

## 2023-11-05 LAB — COMPREHENSIVE METABOLIC PANEL
ALT: 27 U/L (ref 0–44)
AST: 22 U/L (ref 15–41)
Albumin: 1.8 g/dL — ABNORMAL LOW (ref 3.5–5.0)
Alkaline Phosphatase: 43 U/L (ref 38–126)
Anion gap: 11 (ref 5–15)
BUN: 93 mg/dL — ABNORMAL HIGH (ref 8–23)
CO2: 19 mmol/L — ABNORMAL LOW (ref 22–32)
Calcium: 10.1 mg/dL (ref 8.9–10.3)
Chloride: 105 mmol/L (ref 98–111)
Creatinine, Ser: 1.74 mg/dL — ABNORMAL HIGH (ref 0.61–1.24)
GFR, Estimated: 41 mL/min — ABNORMAL LOW (ref >60)
Glucose, Bld: 202 mg/dL — ABNORMAL HIGH (ref 70–99)
Potassium: 3.6 mmol/L (ref 3.5–5.1)
Sodium: 135 mmol/L (ref 135–145)
Total Bilirubin: 0.6 mg/dL (ref <1.2)
Total Protein: 6.1 g/dL — ABNORMAL LOW (ref 6.5–8.1)

## 2023-11-05 LAB — GLUCOSE, CAPILLARY
Glucose-Capillary: 181 mg/dL — ABNORMAL HIGH (ref 70–99)
Glucose-Capillary: 185 mg/dL — ABNORMAL HIGH (ref 70–99)
Glucose-Capillary: 202 mg/dL — ABNORMAL HIGH (ref 70–99)
Glucose-Capillary: 207 mg/dL — ABNORMAL HIGH (ref 70–99)
Glucose-Capillary: 243 mg/dL — ABNORMAL HIGH (ref 70–99)
Glucose-Capillary: 260 mg/dL — ABNORMAL HIGH (ref 70–99)
Glucose-Capillary: 265 mg/dL — ABNORMAL HIGH (ref 70–99)

## 2023-11-05 LAB — CULTURE, BLOOD (ROUTINE X 2)
Culture: NO GROWTH
Culture: NO GROWTH
Special Requests: ADEQUATE

## 2023-11-05 LAB — APTT
aPTT: 79 s — ABNORMAL HIGH (ref 24–36)
aPTT: 60 s — ABNORMAL HIGH (ref 24–36)

## 2023-11-05 LAB — CBC
HCT: 27.2 % — ABNORMAL LOW (ref 39.0–52.0)
Hemoglobin: 8.5 g/dL — ABNORMAL LOW (ref 13.0–17.0)
MCH: 26.8 pg (ref 26.0–34.0)
MCHC: 31.3 g/dL (ref 30.0–36.0)
MCV: 85.8 fL (ref 80.0–100.0)
Platelets: 64 10*3/uL — ABNORMAL LOW (ref 150–400)
RBC: 3.17 MIL/uL — ABNORMAL LOW (ref 4.22–5.81)
RDW: 16.8 % — ABNORMAL HIGH (ref 11.5–15.5)
WBC: 14.9 10*3/uL — ABNORMAL HIGH (ref 4.0–10.5)
nRBC: 2.6 % — ABNORMAL HIGH (ref 0.0–0.2)

## 2023-11-05 LAB — PROTIME-INR
INR: 2.2 — ABNORMAL HIGH (ref 0.8–1.2)
Prothrombin Time: 24.4 s — ABNORMAL HIGH (ref 11.4–15.2)

## 2023-11-05 LAB — PHOSPHORUS: Phosphorus: 2.6 mg/dL (ref 2.5–4.6)

## 2023-11-05 LAB — MAGNESIUM: Magnesium: 2.3 mg/dL (ref 1.7–2.4)

## 2023-11-05 LAB — TRIGLYCERIDES: Triglycerides: 140 mg/dL (ref <150)

## 2023-11-05 LAB — HEPARIN INDUCED PLATELET AB (HIT ANTIBODY): Heparin Induced Plt Ab: 1.556 {OD_unit} — ABNORMAL HIGH (ref 0.000–0.400)

## 2023-11-05 MED ORDER — TRACE MINERALS CU-MN-SE-ZN 300-55-60-3000 MCG/ML IV SOLN
INTRAVENOUS | Status: AC
  Filled 2023-11-05 (×2): qty 2000

## 2023-11-05 MED ORDER — FAT EMUL FISH OIL/PLANT BASED 20% (SMOFLIPID)IV EMUL
250.0000 mL | INTRAVENOUS | Status: AC
  Administered 2023-11-05: 250 mL via INTRAVENOUS
  Filled 2023-11-05: qty 250

## 2023-11-05 MED ORDER — TRACE MINERALS CU-MN-SE-ZN 300-55-60-3000 MCG/ML IV SOLN: INTRAVENOUS | Status: DC

## 2023-11-05 MED ORDER — FAT EMUL FISH OIL/PLANT BASED 20% (SMOFLIPID)IV EMUL: 250.0000 mL | INTRAVENOUS | Status: DC

## 2023-11-05 MED ORDER — INSULIN GLARGINE-YFGN 100 UNIT/ML ~~LOC~~ SOLN
50.0000 [IU] | Freq: Two times a day (BID) | SUBCUTANEOUS | Status: DC
  Administered 2023-11-05 – 2023-11-06 (×2): 50 [IU] via SUBCUTANEOUS
  Filled 2023-11-05 (×3): qty 0.5

## 2023-11-05 NOTE — Progress Notes (Addendum)
Pharmacy Heparin Induced Thrombocytopenia (HIT) Note:  Matthew Molina is an 72 y.o. male being evaluated for HIT. Heparin was started 11/5 for portal vein thrombosis and possible PE, and baseline platelets were 332.   HIT labs were ordered on 11/17 when platelets dropped to 37.  Auto-populate labs: No results found for: "HEPINDPLTAB", "SRALOWDOSEHP", "SRAHIGHDOSEH"   CALCULATE SCORE:  4Ts (see the HIT Algorithm) Score  Thrombocytopenia 2  Timing 1  Thrombosis 2  Other causes of thrombocytopenia 1  Total 6     Recommendations (A or B) are based on available lab results (HIT antibody and/or SRA) and the HIT algorithm    A. HIT antibody result available  Possible HIT   Order SRA:  Yes Discontinue heparin / LMWH:  Yes Initiate alternative anticoagulation:  Yes Document heparin allergy:  Yes   B. SRA result availability  SRA not available  Plan (Discussed with provider) Labs ordered:  SRA not needed  - wait for heparin ab to return, still pending this morning  Heparin allergy:  Heparin allergy documented or updated. Anticoagulation plans:  Begin alternative anticoagulation with argatroban.  Assessment: Pt started on argatroban for HIT r/o as above. Confirmatory aPTT remains within therapeutic range at 79 seconds. No issues noted with bleeding. PLTs slightly improved 37 > 64. HgB 8.9 > 8.5.   Plan: Continue argatroban at 0.5 mcg/kg/min Q12h aPTT given critical illness.  F/u HIT ab.   Estill Batten, PharmD, BCCCP  Clinical Pharmacist [phone]  Please check AMION for all [facility] Pharmacy numbers 11/05/2023

## 2023-11-05 NOTE — Telephone Encounter (Signed)
I have called the patient's daughter Matthew Molina to schedule a goals of care meeting with the patient's family.  Patient is unresponsive and we are unsure of what his quality of life will be.  Family would like to sit down and go over each problem current care and what outcome is. Plan is for tomorrow, daughter will call nursing staff and let them know what time. Daughter is from out of town and will join via video or FaceTime however all other family members live in town and will be present. Denita will call her family members they will coordinate a time and nursing staff here will let us know what that time is so that PCCM staff can discuss goals of care with the family members.

## 2023-11-05 NOTE — Plan of Care (Signed)
  Problem: Nutrition: Goal: Adequate nutrition will be maintained Outcome: Progressing   Problem: Pain Management: Goal: General experience of comfort will improve Outcome: Progressing

## 2023-11-05 NOTE — Progress Notes (Signed)
PHARMACY - TOTAL PARENTERAL NUTRITION CONSULT NOTE  Indication:  Gastric outlet obstruction  Patient Measurements: Height: 6' (182.9 cm) Weight: 83.7 kg (184 lb 8.4 oz) IBW/kg (Calculated) : 77.6 TPN AdjBW (KG): 87.2 Body mass index is 25.03 kg/m.  Assessment:  25 YOM with GOO from PUD stricture/duodenitis that is preventing him from passing any food or drink, causing abdominal distention, pain, N/V.  Underwent gastrojejunostomy on 11/8 and diet gradually advanced to full liquid on 11/13.   UGI showed inability to pass through gastrojejunostomy.  He was on TPN 11/8 >> 11/13.    Patient was transferred to the ICU on 11/13 due to AMS and apnea.  CT showed AIS and free air in abdomen, dilated gallbladder, possible cholecystitis and possible hematoma/mass/infection. CCS think gallbladder is unremarkable.  Patient was transferred to [facility] on 11/14.  Pharmacy consulted to resume TPN.  Glucose / Insulin: hx DM2 - CBGs 181-250 Stress steroid initiated 11/14 > reduce to daily 11/17 (CCM plans to taper steroid over one week once off pressor) Insulin gtt briefly 11/14  Used 36 units SSI in the past 24 hrs and Semglee 45 units BID Electrolytes: K:3.6 (s/p Kphos), CO2: 19, phos: 2.6 (s/p Kphos) CoCa elevated at 11.9, Mag: 2.3, others WNL Renal: SCr stable 1.74, BUN up to 93 Hepatic: LFTs / tbili / TG WNL, albumin 1.8, TG: 140  Intake / Output; MIVF: UOP 1.6 ml/kg/hr, last Lasix 11/14, NG - (bilious), stool not charted  GI Imaging: 11/6 EGD: showed distal duodenal obstruction 11/11 upper GI: contrast was not moving down GJ, no passage via anastomosis  11/12 KUB: contrast within small bowel and colon 11/13 CT: free air in abdomen, dilated gallbladder, possible cholecystitis and possible hematoma/mass/infection 11/15 abd U/S: small amount of sludge within gallbladder, minimal ascites GI Surgeries / Procedures: none since TPN initiation   Central access: PICC placed 10/26/23 TPN  start date: 10/26/23 >> 10/31/23, restarting 11/15 >>  Nutritional Goals: RD Estimated Needs Total Energy Estimated Needs: 2300-2600 Total Protein Estimated Needs: 130-145 gm Total Fluid Estimated Needs: 2.3-2.6 L  Current Nutrition:  TPN  Plan:  Unable to meet needs given limitations of pre-mixed Clinimix Continue Clinimix 8/10 at 82 ml/hr and SMOFlipid at 20.8 ml/hr x 12 hrs to provide 1798 kCal and 157g AA, meeting 78% of kCal and exceeding AA goal by 16%.  Monitor BUN closely. Add standard MVI and trace elements to TPN Continue resistant SSI Q4H and increase Semglee to 50 units SQ BID, f/u ability to wean steroids, will likely need a decrease in Semglee once steroids go down.  Monitor TPN labs on Mon/Thurs - labs in AM (monitor Mag/Ca and switch to Clinimix E as able, calcium is still too elevated to change)  Estill Batten, PharmD, BCCCP  11/05/2023, 8:11 AM

## 2023-11-05 NOTE — Progress Notes (Signed)
10 Days Post-Op   Subjective/Chief Complaint: Sedated, intubated   Objective: Vital signs in last 24 hours: Temp:  [98 F (36.7 C)-99.3 F (37.4 C)] 98.9 F (37.2 C) (11/18 0400) Pulse Rate:  [87-112] 102 (11/18 0739) Resp:  [17-33] 29 (11/18 0739) BP: (78-150)/(56-113) 95/67 (11/18 0630) SpO2:  [90 %-98 %] 96 % (11/18 0630) FiO2 (%):  [40 %-50 %] 40 % (11/18 0739) Weight:  [83.7 kg] 83.7 kg (11/18 0530) Last BM Date : 11/03/23  Intake/Output from previous day: 11/17 0701 - 11/18 0700 In: 3344.8 [I.V.:2715.2; IV Piggyback:629.6] Out: 6205 [Urine:3205; Emesis/NG output:3000] Intake/Output this shift: No intake/output data recorded.  Ab soft nondistended incision without infection staples in place, nontender (but not very responsive today)  Lab Results:  Recent Labs    11/04/23 0325 11/05/23 0407  WBC 11.8* 14.9*  HGB 8.9* 8.5*  HCT 27.5* 27.2*  PLT 37* 64*   BMET Recent Labs    11/04/23 0325 11/05/23 0407  NA 135 135  K 3.0* 3.6  CL 105 105  CO2 20* 19*  GLUCOSE 229* 202*  BUN 82* 93*  CREATININE 1.67* 1.74*  CALCIUM 10.2 10.1   PT/INR Recent Labs    11/05/23 0407  LABPROT 24.4*  INR 2.2*   ABG No results for input(s): "PHART", "HCO3" in the last 72 hours.  Invalid input(s): "PCO2", "PO2"  Studies/Results: CT HEAD WO CONTRAST ( )  Result Date: 11/04/2023 CLINICAL DATA:  Stroke follow-up EXAM: CT HEAD WITHOUT CONTRAST TECHNIQUE: Contiguous axial images were obtained from the base of the skull through the vertex without intravenous contrast. RADIATION DOSE REDUCTION: This exam was performed according to the departmental dose-optimization program which includes automated exposure control, adjustment of the mA and/or kV according to patient size and/or use of iterative reconstruction technique. COMPARISON:  Brain MRI from 2 days ago FINDINGS: Brain: Multifocal acute infarction in the right cerebellum, most confluent in the right superior cerebellum.  Small scattered cortical infarcts along the bilateral cerebral convexity, most confluent at the right frontal operculum. No gross progression or hemorrhage. No hydrocephalus or shift. Vascular: No hyperdense vessel or unexpected calcification. Skull: No acute finding Sinuses/Orbits: Motion artifact. IMPRESSION: Multiple acute infarcts. No hemorrhagic conversion or gross progression. The largest is in the upper right cerebellum, no hydrocephalus. Motion artifact. Electronically Signed   By: Tiburcio Pea M.D.   On: 11/04/2023 06:04    Anti-infectives: Anti-infectives (From admission, onward)    Start     Dose/Rate Route Frequency Ordered Stop   11/02/23 1500  vancomycin (VANCOREADY) IVPB 1250 mg/250 mL  Status:  Discontinued        1,250 mg 166.7 mL/hr over 90 Minutes Intravenous Every 24 hours 11/01/23 1508 11/02/23 1339   11/02/23 1339  vancomycin variable dose per unstable renal function (pharmacist dosing)  Status:  Discontinued         Does not apply See admin instructions 11/02/23 1339 11/03/23 1501   11/01/23 1500  vancomycin (VANCOREADY) IVPB 1750 mg/350 mL        1,750 mg 175 mL/hr over 120 Minutes Intravenous  Once 11/01/23 1424 11/01/23 1656   10/31/23 2200  piperacillin-tazobactam (ZOSYN) IVPB 3.375 g        3.375 g 12.5 mL/hr over 240 Minutes Intravenous Every 8 hours 10/31/23 1753     10/26/23 1400  ceFAZolin (ANCEF) IVPB 2g/100 mL premix        2 g 200 mL/hr over 30 Minutes Intravenous On call to O.R. 10/26/23 1341 10/26/23 1558  10/26/23 1343  ceFAZolin (ANCEF) 2-4 GM/100ML-% IVPB       Note to Pharmacy: Sherren Kerns H: cabinet override      10/26/23 1343 10/26/23 1559       Assessment/Plan: POD 10 s/p Gastrojejunostomy 10/26/23 by Dr. Robyne Peers for GOO secondary to PUD stricture/duodenitis and portal vein thrombosis - CT a/p 11/13 shows small amount of free air but no free fluid to indicate a leak or other intraabdominal issue; radiology reports heterogeneous masslike  density in the region of the first and second portion of the duodenum with some new central fluid areas of attenuation that may represent hematoma, mass and/or infection. The stomach is markedly dilated.  - OGT output still high.  Cont NPO and nothing down tube until we can see persistent evidence of gastric emptying as stomach is likely not emptying through GJ well yet. UGI 11/11 showed no passage through anastomosis.   -once extubated would need OG converted to NGT prior to that.  - [place] of gb normal. -if he is unable to eat/be fed enterally at some point a conversation about a g or more appropriately a gj tube would be in order but not right now. -consider repeat ct a/p later this week  Stroke: per neurology, prognosis unclear now   ID - zosyn, vancomycin VTE - heparin gtt on hold due to severe thrombocytopenia, now on argatrogan, HIT panel pending FEN - IVF, NPO/OGT to LIWS. TPN Foley - none   AMS/acute stroke Shock VDRF DM HTN HLD CAD COPD IgG MGUS  Emelia Loron 11/05/2023

## 2023-11-05 NOTE — Progress Notes (Addendum)
STROKE TEAM PROGRESS NOTE   BRIEF HPI [name] is a 72 y.o. male with history of PMH IgG MGUS, COPD, DMT2, HTN, HLD presents to [facility] on 11/5 w/ abd pain, N/V.   He started to have gradual worsening of his abdominal pain over the past week and some nausea vomiting and decreased p.o. intake as well as generalized weakness and presented to [facility] on 10/23/2023.  CT abdomen pelvis with marked distention of stomach at the level of the duodenum and new hypodensity upstream portal vein suspicious for nonocclusive thrombus and partially imaged hypodensity in the right upper lobe pulmonary artery which was artifact versus PE.  Started on heparin.  GI and surgery consulted.  Underwent gastrojejunostomy for concern of gastric outlet obstruction secondary to PUD/duodenitis on 10/26/2023.  Started on TPN.  On 1113, had increased respiratory distress and AMS.  Worsening abdominal pain.  CT head with a hypodensity in the superior right insula but could reflect air throughout the abdomen, new dilated gallbladder with multiple gallstones concerning for cholecystitis.  Surgery was consulted with no intervention.  Patient had increasing her mental status and required intubation.  Also had extensive hypotension requiring Levophed and was transferred to [facility]. MRI was completed that showed multifocal infarcts with the largest 1 being in the right cerebellum concerning for a central etiology. Neurology consulted. Patient unable to provide any history at this time-intubated, on sedation with Precedex and fentanyl.  SIGNIFICANT HOSPITAL EVENTS 11/5 admit to [facility] 11/14 transferred to [facility] for respiratory failure and septic shock. Intubated on levo. Pccm consulted  INTERIM HISTORY/SUBJECTIVE Patient remains intubated, sedated with fentanyl and Precedex.  His neurological exam is very poor, and CCM is having goals of care discussions with family.   OBJECTIVE  CBC    Component Value  Date/Time   WBC 14.9 (H) 11/05/2023 0407   RBC 3.17 (L) 11/05/2023 0407   HGB 8.5 (L) 11/05/2023 0407   HGB 9.6 (L) 10/12/2023 1607   HCT 27.2 (L) 11/05/2023 0407   HCT 30.4 (L) 10/12/2023 1607   PLT 64 (L) 11/05/2023 0407   PLT 280 10/12/2023 1607   MCV 85.8 11/05/2023 0407   MCV 90 10/12/2023 1607   MCH 26.8 11/05/2023 0407   MCHC 31.3 11/05/2023 0407   RDW 16.8 (H) 11/05/2023 0407   RDW 14.0 10/12/2023 1607   LYMPHSABS 1.2 11/01/2023 0425   LYMPHSABS 2.1 06/23/2021 1058   MONOABS 1.3 (H) 11/01/2023 0425   EOSABS 0.1 11/01/2023 0425   EOSABS 0.4 06/23/2021 1058   BASOSABS 0.0 11/01/2023 0425   BASOSABS 0.0 06/23/2021 1058    BMET    Component Value Date/Time   NA 135 11/05/2023 0407   NA 140 10/12/2023 1607   K 3.6 11/05/2023 0407   CL 105 11/05/2023 0407   CO2 19 (L) 11/05/2023 0407   GLUCOSE 202 (H) 11/05/2023 0407   BUN 93 (H) 11/05/2023 0407   BUN 15 10/12/2023 1607   CREATININE 1.74 (H) 11/05/2023 0407   CREATININE 1.38 (H) 05/26/2020 1149   CALCIUM 10.1 11/05/2023 0407   EGFR 59 (L) 10/12/2023 1607   GFRNONAA 41 (L) 11/05/2023 0407   GFRNONAA 51 (L) 05/26/2020 1149    IMAGING past 24 hours No results found.  Vitals:   11/05/23 1100 11/05/23 1115 11/05/23 1130 11/05/23 1145  BP: 108/74     Pulse: (!) 115 (!) 106 (!) 113 (!) 113  Resp: (!) 26 (!) 25 (!) 28 (!) 25  Temp:  98.9 F (37.2 C)   TempSrc:   Axillary   SpO2: 94% 94% 94% 94%  Weight:      Height:         NEURO: on precedex and fentanyl Mental Status: Intubated and sedated on fentanyl drip.  Eyes are closed.  Unresponsive.  Does not respond to sternal rub.  Pupils small and sluggishly reactive, no oculocephalic reflex, unable to follow commands, no response to noxious stimuli, no corneal reflexes, positive cough and gag.  No spontaneous motor response or even reflex response to pain.  Plantars were mute.   NIHSS: 25   ASSESSMENT/PLAN  Acute Ischemic Infarct:  right cerebellum, left  cerebellum, right frontal operculum, bilateral frontal, parietal and occipital lobes. Etiology:  Cardioembolic in the setting of shock and portal vein thrombosis CT head 10/31/2023: 2.3 cm hypodensity seen involving the superior right insula/right frontal operculum.    CTA head & neck  MRI   Large acute infarct in the superior right cerebellum, with smaller acute infarcts in the left cerebellum. The right cerebellar infarct is associated with petechial hemorrhage 2. Cortical acute infarct in the right frontal operculum correlates with the hypodensity suspected on the 10/31/2023 CT. Additional primarily cortical acute infarcts in the bilateral frontal, parietal, and occipital lobes. Daughter updated via phone on 11/04/23 Repeat head CT 11/17: Multiple acute infarcts with no hemorrhagic conversion or gross progression, no hydrocephalus 2D Echo EF > 75% May consider TEE if aggressive measures desired after goals of care conversation with family LDL 35 HgbA1c 6.0 VTE prophylaxis -fully anticoagulated with argatroban Therapy recommendations:  Pending Disposition:  Pending   Hx of Stroke/TIA None   Hypertension Home meds:  None  Stable Blood Pressure Goal: Normotension   Hyperlipidemia Home meds:  None  LDL 35, goal < 70 High intensity statin not indicated   Diabetes type II: None  HgbA1c 6.0, goal < 7.0  Dysphagia Patient has post-stroke dysphagia, SLP consulted    Diet   Diet NPO time specified   Advance diet as tolerated  Respiratory failure with aspiration pneumonia Patient intubated 11/14 Ventilator management and antibiotics per CCM Extubate when able  Concern for PE and heparin-induced thrombocytopenia Goals of care discussion pending, CM will obtain CTA if aggressive measures desired Continue anticoagulation with argatroban  Septic shock and concern for endocarditis Patient is in septic shock due to aspiration pneumonia and possible bacterial translocation from  gut Continue norepinephrine per CCM Continue stress dose steroids Consider TEE if aggressive measures desired  Other Active Problems Severe duodenal obstruction Portal vein thrombosis   Hospital day # 13  Patient seen by NP and then by MD, MD to edit note as needed. Cortney E Ernestina Columbia , MSN, AGACNP-BC [facility] See Amion for schedule and pager information 11/05/2023 12:14 PM   STROKE MD NOTE : I have personally obtained history,examined this patient, reviewed notes, independently viewed imaging studies, participated in medical decision making and plan of care.ROS completed by me personally and pertinent positives fully documented  I have made any additions or clarifications directly to the above note. Agree with note above.  Patient's neurological exam is limited due to sedation but is quite poor with hardly any response even to noxious stimuli.  MRI shows by cerebral embolic infarcts with suspicion for endocarditis versus other cardiac source which may need to be determined by doing a TEE.  Given poor general medical condition would recommend doing this only if family wishes to pursue aggressive care.  Agree with  palliative care discussion with family about goals.  Consider checking CT angiogram of the chest to confirm pulmonary embolism and if not found discontinue argatroban.  Discussed with Dr. Celine Mans critical care medicine.  No family available at the bedside. This patient is critically ill and at significant risk of neurological worsening, death and care requires constant monitoring of vital signs, hemodynamics,respiratory and cardiac monitoring, extensive review of multiple databases, frequent neurological assessment, discussion with family, other specialists and medical decision making of high complexity.I have made any additions or clarifications directly to the above note.This critical care time does not reflect procedure time, or teaching time or supervisory time of  PA/NP/Med Resident etc but could involve care discussion time.  I spent 30 minutes of neurocritical care time  in the care of  this patient.     Delia Heady, MD Medical Director [facility] Stroke Center Pager: [phone] 11/05/2023 3:44 PM   To contact Stroke Continuity provider, please refer to [email or web address]. After hours, contact General Neurology

## 2023-11-05 NOTE — Inpatient Diabetes Management (Signed)
Inpatient Diabetes Program Recommendations  AACE/ADA: New Consensus Statement on Inpatient Glycemic Control (2015)  Target Ranges:  Prepandial:   less than 140 mg/dL      Peak postprandial:   less than 180 mg/dL (1-2 hours)      Critically ill patients:  140 - 180 mg/dL   Lab Results  Component Value Date   GLUCAP 185 (H) 11/05/2023   HGBA1C 6.0 (H) 11/01/2023    Review of Glycemic Control  Latest Reference Range & Units 11/04/23 11:25 11/04/23 15:04 11/04/23 20:30 11/05/23 00:31 11/05/23 04:05 11/05/23 07:59  Glucose-Capillary 70 - 99 mg/dL 220 (H) 254 (H) 270 (H) 202 (H) 181 (H) 185 (H)  (H): Data is abnormally high Diabetes history: Type 2 DM Outpatient Diabetes medications: Farxiga 5 mg QD Current orders for Inpatient glycemic control: Semglee 45 units BID, Novolog 0-20 units Q4H Solucortef 100 mg QD TPN- no insulin in solution  Inpatient Diabetes Program Recommendations:    Consider increasing Semglee to 50 units BID. Secure chat sent to [place], [facility].   Thanks, Lujean Rave, MSN, RNC-OB Diabetes Coordinator [phone] (8a-5p)

## 2023-11-05 NOTE — Plan of Care (Signed)
Pt's conditioned managed with Levo, Precedex, Fentanyl, Argatroban, TPN and Lipids. Afebrile. Excoriation still persists on scrotum, cleansed well and covered with dressing. Suctioned of small amount of secretions. Remains on ventilator please see chart for current settings, adjusted per RT to maintain O2 sats >92%. HOB elevated 30 degrees. OGT continues at [facility] with bilious output. Midabdominal incision, remains intact and well approximated with staples. Will continue to monitor.

## 2023-11-05 NOTE — Progress Notes (Addendum)
NAME:  Matthew Molina, MRN:  [id number], DOB:  10/29/1951, LOS: 13 ADMISSION DATE:  10/23/2023, CONSULTATION DATE:  11/14 REFERRING MD:  Dr. Arbutus Leas, CHIEF COMPLAINT:  septic shock   History of Present Illness:  Patient is a 72 yo M w/ pertinent PMH IgG MGUS, COPD, DMT2, HTN, HLD presents to [facility] on 11/5 w/ abd pain, N/V.  Patient recently admitted to hospital on 10/13 w/ N/V and abd pain. Treated for aspiration pna. GI performed EGD showing grade B esophagitis, duodenitis, and gastric ulcer nonbleeding. Discharged 10/16 w/ PPI and sucralfate.   Patient began having gradual worsening abd pain over the past week. Also having some N/V and decreased po intake. Generalized weakness. Denies sob, diarrhea, chest pain. Came to [facility] on 11/5. On arrival hemodynamically stable and sats 92% on room air. Lipase 84. CT abd/pelvis w/ contrast marked distention of the stomach to the level of the duodenum where there is circumferential thickening and heterogenous hypoattenuation at the site of the patient's previous duodenitis; new hypodensity upstream portal vein suspicious for nonocclusive thrombus and partially imaged hypodensity in the right upper lobe pulmonary artery which may represent artifact versus PE. Started on heparin. GI and surgery consulted. Patient underwent gastrojejunostomy for concern of gastric outlet obstruction secondary to PUD/duodenitis on 10/26/2023. Patient started on TPN for short period and stopped 11/13.   Overnight on 11/13 patient with increased respiratory distress and AMS. Having some abd pain. CT head 2.3 cm hypodensity involving the superior right insula/right frontal operculum, age indeterminate, but could reflect an evolving acute ischemic infarct.  CT abd/pelvis w/ contrast showing small amount of free air throughout abd; new dilated gallbladder w/ multiple gallstones concerning for cholecystits. Surgery consulted and no intervention at this time. Patient w/ increasing AMS and required  intubation. Patient also w/ hypotension requiring levophed. Plan to transfer to [facility] and PCCM consulted.  Pertinent  Medical History   Past Medical History:  Diagnosis Date   Acute blood loss anemia 01/05/2020   Acute respiratory failure with hypoxia (HCC)    Arthritis    COPD (chronic obstructive pulmonary disease) (HCC)    Depression    Diabetes mellitus    Diabetes mellitus without complication (HCC)    Diverticulitis    Elevated PSA 10/01/2016   Encounter for support and coordination of transition of care 01/14/2020   Hypercholesterolemia    Hyperlipidemia    Hypertension    Insomnia 01/05/2016   Ischemic colitis (HCC) 01/05/2020   Multiple lung nodules on CT 04/02/2015   Nicotine addiction    Obesity    Oxygen deficiency    qhs   Schizophrenia (HCC)    Significant Hospital Events: Including procedures, antibiotic start and stop dates in addition to other pertinent events   11/5 admit to [facility]  Patient underwent gastrojejunostomy for concern of gastric outlet obstruction secondary to PUD/duodenitis on 10/26/2023 11/13 change in MS. Concern for acute stroke  11/14 transferred to [facility] for respiratory failure and septic shock. Intubated on levo. Pccm consulted 11/15 On propofol, fentanyl Requiring vasopressin and norepinephrine Unresponsive, Cough, gag, corneals present 11/16 follow-up CT brain expected findings of acute stroke, no hemorrhage. 11/17: Attempting sedation further.  Heparin stopped due to severe thrombocytopenia HIT panel sent.  Added argatroban 11/18: Attempting to wean sedation and levo  Interim History / Subjective:  Weaning sedation, and levo, low grade temp at 99.7 Failed wean 11/18 Continue stress dose steroids while on vasopressors  Objective   Blood pressure 95/67, pulse (!) 102, temperature  99.8 F (37.7 C), temperature source Axillary, resp. rate (!) 29, height 6' (1.829 m), weight 83.7 kg, SpO2 96%.    Vent Mode: PRVC FiO2 (%):  [40 %-50 %] 40  % Set Rate:  [26 bmp] 26 bmp Vt Set:  [620 mL] 620 mL PEEP:  [5 cmH20] 5 cmH20 Plateau Pressure:  [8 cmH20-18 cmH20] 18 cmH20   Intake/Output Summary (Last 24 hours) at 11/05/2023 0819 Last data filed at 11/05/2023 0600 Gross per 24 hour  Intake 3163.53 ml  Output 4290 ml  Net -1126.47 ml   Filed Weights   11/03/23 0500 11/04/23 0706 11/05/23 0530  Weight: 84.7 kg 84 kg 83.7 kg    Examination: Sedated no changes overnight HEENT normocephalic atraumatic orally intubated no clear JVD Pulmonary: Bilateral chest excursion, Scattered rhonchi, tidal volumes in the 400 range when placed on pressure support of 5 , abdominal muscle use noted Cardiac regular rate and rhythm Extremities dependent edema pulses strong Neuro Will open eyes to voice, purposeful or following commands  Labs reviewed 11/18 INR 2.2 CO2 19, Glucose 202, BUN 93, Creatinine 1.74 ( 1.67),Total Protein 6.1, Albumin 1.8, GFR 41, Triglycerides 140, Phos 2.6, MAG 2.3 WBC 14.9, HGB 8.5, Platelets 64, better since argatroban started Net negative 6 L T Max 99.3  Resolved Hospital Problem list     Assessment & Plan:   Acute hypoxemic respiratory failure due to aspiration PNA.  -sputum culture re-incubated -cxr aeration improved. Tmax 99.3  wbc slight bump overnight  Mental status and deconditioning appear to be largest barrier after his stroke Plan PSV as tolerated Cont night time rest on full support VAP bundle PAD protocol RASS 0 to -1  Will speak to family about trach   History of obstructive lung disease Plan Continue Brovana, continue Pulmicort and DuoNeb  Septic shock due to asp PNA and probable bacterial translocation from gut w/ on-going drug induced hypotension  -still on NE but weaning daily Plan Keep euvolemic volume status Wean Norepi for MAP >65 Cont stress dose steroids. Taper to off over 5-7d after off pressors.  Cont tele  Severe distal duodenal obstruction s/p gastrojejunostomy  11/8 Cholelithiasis -Surgery continues to follow. Abd US obtained 11/15 sm amt sludge no murphy's sign  Plan Abx per above Cont ppi  Keep strict NPO TPN per surgical team  Cont LIWS (once extubated will have to have OGT placed nasal)  Acute metabolic encephalopathy due to sepsis and metabolic acidosis complicated by  Acute ischemic stroke 11/13 w/ f/u imaging showing mult acute infarcts but no hemorrhagic conversion  Plan Cont supportive care  PRN fent  Dex for RASS 0 to -1  Given presence of petechial hemorrhage neuro NOT recommending antiplatelets w/ the heparin  Close obs of neuro status w/ increased risk of ICH PT/OT/rehab svc as able    Portal vein thrombosis Concern for PE Plan Holding heparin given severe thrombocytopenia started argatroban 11/17 Intermittent CBC   Severe thrombocytopenia  Plan Cont to monitor  Discontinue heparin , slightly better on argatroban  Anemia of critical illness No current evidence of bleeding Plan Am cbc Trigger for transfusion hgb < 7  Chronic kidney disease stage IIIa Slight bump overnight Plan Renal dose meds to continue  Strict I&O Trend BMET  Type II DM w/ Steroid induced hyperglycemia  Still not at goal , but increased in basal insulin 11/17 and slightly better Plan Changed basal coverage to semglee 35units bid. increased to 45 units BID  Cont resistant ssi  If further  up-titration needed will add q 4 hr short acting scheduled   Fluid and electrolyte imbalance: hyponatremia, boarderline hypokalemia, hypophosphatemia, NAGMA Acid base slowly improving Plan Replace K & PO4 Am chem    Hypertension Hyperlipidemia plan Hold antihypertensives at present Continue statin  Depression/anxiety plan Home meds on hold, takes Buspar, risperdal and trazedone at home    Needs family goals of care discussion. If full aggressive care desired, will Need CTA to definitively determine if he has a PE and needs needs  anticoagulation. Also would need TEE to evaluate for vegetative heart valves, and trach as this will be a long term care existence .   Best Practice (right click and "Reselect all SmartList Selections" daily)   Diet/type: NPO-->TNA DVT prophylaxis: SCD heparin discontinued on 11/17 due to thrombocytopenia, argatroban started 11/17 GI prophylaxis: PPI Lines: N/A Foley:  N/A Code Status:  full code Last date of multidisciplinary goals of care discussion [11/14 updated daughter over phone]   My critical care time 34 min   Bevelyn Ngo, MSN, AGACNP-BC [place] [facility] See Amion for personal pager PCCM on call pager [phone]  11/05/2023 8:19 AM

## 2023-11-05 NOTE — Progress Notes (Signed)
PHARMACY - ANTICOAGULATION CONSULT NOTE  Pharmacy Consult for argatroban Indication:  r/o HIT  Allergies  Allergen Reactions   Asa [Aspirin] Other (See Comments)    Stomach pain   Crestor [Rosuvastatin] Other (See Comments)    Stomach pain   Heparin Other (See Comments)    Heparin antibody positive, SRA pending   Morphine Nausea And Vomiting   Wellbutrin [Bupropion] Other (See Comments)    Stomach pain   Zoloft [Sertraline Hcl] Other (See Comments)    Stomach pain    Patient Measurements: Height: 6' (182.9 cm) Weight: 83.7 kg (184 lb 8.4 oz) IBW/kg (Calculated) : 77.6  Vital Signs: Temp: 98.9 F (37.2 C) (11/18 1921) Temp Source: Oral (11/18 1921) BP: 91/76 (11/18 1800) Pulse Rate: 93 (11/18 1800)  Labs: Recent Labs    11/03/23 0324 11/04/23 0325 11/04/23 0341 11/04/23 1156 11/04/23 1633 11/05/23 0407 11/05/23 1815  HGB 8.2* 8.9*  --   --   --  8.5*  --   HCT 25.5* 27.5*  --   --   --  27.2*  --   PLT 55* 37*  --   --   --  64*  --   APTT  --   --   --    < > 61* 79* 60*  LABPROT  --   --   --   --   --  24.4*  --   INR  --   --   --   --   --  2.2*  --   HEPARINUNFRC 0.37  --  0.27*  --   --   --   --   CREATININE 2.25* 1.67*  --   --   --  1.74*  --    < > = values in this interval not displayed.    Estimated Creatinine Clearance: 41.5 mL/min (A) (by C-G formula based on SCr of 1.74 mg/dL (H)).   Assessment: 72 y.o. M on heparin for portal vein thrombosis and possible PE. Switched to argatroban for r/o HIT. Heparin antibody positive, SRA has been ordered.   aPTT 60 sec (therapeutic) on argatroban 0.5 mcg/kg/min. No bleeding noted.  Goal of Therapy:  aPTT 50-90 seconds Monitor platelets by anticoagulation protocol: Yes   Plan:  Continue argatroban 0.5 mcg/kg/min Q12h aPTT given critical illness F/u SRA  Christoper Fabian, PharmD, BCPS Please see amion for complete clinical pharmacist phone list 11/05/2023,7:56 PM

## 2023-11-06 ENCOUNTER — Inpatient Hospital Stay (HOSPITAL_COMMUNITY): Payer: 59

## 2023-11-06 ENCOUNTER — Inpatient Hospital Stay: Payer: 59 | Admitting: Physician Assistant

## 2023-11-06 DIAGNOSIS — I81 Portal vein thrombosis: Secondary | ICD-10-CM | POA: Diagnosis not present

## 2023-11-06 DIAGNOSIS — J9601 Acute respiratory failure with hypoxia: Secondary | ICD-10-CM | POA: Diagnosis not present

## 2023-11-06 LAB — PROTIME-INR
INR: 2.2 — ABNORMAL HIGH (ref 0.8–1.2)
Prothrombin Time: 24.6 s — ABNORMAL HIGH (ref 11.4–15.2)

## 2023-11-06 LAB — CBC WITH DIFFERENTIAL/PLATELET
Abs Immature Granulocytes: 0.79 10*3/uL — ABNORMAL HIGH (ref 0.00–0.07)
Basophils Absolute: 0 10*3/uL (ref 0.0–0.1)
Basophils Relative: 0 %
Eosinophils Absolute: 0 10*3/uL (ref 0.0–0.5)
Eosinophils Relative: 0 %
HCT: 31.6 % — ABNORMAL LOW (ref 39.0–52.0)
Hemoglobin: 9.6 g/dL — ABNORMAL LOW (ref 13.0–17.0)
Immature Granulocytes: 4 %
Lymphocytes Relative: 6 %
Lymphs Abs: 1.1 10*3/uL (ref 0.7–4.0)
MCH: 26.6 pg (ref 26.0–34.0)
MCHC: 30.4 g/dL (ref 30.0–36.0)
MCV: 87.5 fL (ref 80.0–100.0)
Monocytes Absolute: 1.1 10*3/uL — ABNORMAL HIGH (ref 0.1–1.0)
Monocytes Relative: 6 %
Neutro Abs: 15.6 10*3/uL — ABNORMAL HIGH (ref 1.7–7.7)
Neutrophils Relative %: 84 %
Platelets: 86 10*3/uL — ABNORMAL LOW (ref 150–400)
RBC: 3.61 MIL/uL — ABNORMAL LOW (ref 4.22–5.81)
RDW: 16.9 % — ABNORMAL HIGH (ref 11.5–15.5)
WBC: 18.6 10*3/uL — ABNORMAL HIGH (ref 4.0–10.5)
nRBC: 0.5 % — ABNORMAL HIGH (ref 0.0–0.2)

## 2023-11-06 LAB — GLUCOSE, CAPILLARY
Glucose-Capillary: 172 mg/dL — ABNORMAL HIGH (ref 70–99)
Glucose-Capillary: 177 mg/dL — ABNORMAL HIGH (ref 70–99)
Glucose-Capillary: 185 mg/dL — ABNORMAL HIGH (ref 70–99)
Glucose-Capillary: 212 mg/dL — ABNORMAL HIGH (ref 70–99)
Glucose-Capillary: 216 mg/dL — ABNORMAL HIGH (ref 70–99)
Glucose-Capillary: 221 mg/dL — ABNORMAL HIGH (ref 70–99)

## 2023-11-06 LAB — COMPREHENSIVE METABOLIC PANEL
ALT: 41 U/L (ref 0–44)
AST: 40 U/L (ref 15–41)
Albumin: 1.7 g/dL — ABNORMAL LOW (ref 3.5–5.0)
Alkaline Phosphatase: 49 U/L (ref 38–126)
Anion gap: 9 (ref 5–15)
BUN: 115 mg/dL — ABNORMAL HIGH (ref 8–23)
CO2: 17 mmol/L — ABNORMAL LOW (ref 22–32)
Calcium: 10.2 mg/dL (ref 8.9–10.3)
Chloride: 110 mmol/L (ref 98–111)
Creatinine, Ser: 2.05 mg/dL — ABNORMAL HIGH (ref 0.61–1.24)
GFR, Estimated: 34 mL/min — ABNORMAL LOW (ref >60)
Glucose, Bld: 208 mg/dL — ABNORMAL HIGH (ref 70–99)
Potassium: 3.6 mmol/L (ref 3.5–5.1)
Sodium: 136 mmol/L (ref 135–145)
Total Bilirubin: 0.6 mg/dL (ref <1.2)
Total Protein: 5.9 g/dL — ABNORMAL LOW (ref 6.5–8.1)

## 2023-11-06 LAB — BLOOD GAS, VENOUS
Acid-base deficit: 9.9 mmol/L — ABNORMAL HIGH (ref 0.0–2.0)
Bicarbonate: 16.1 mmol/L — ABNORMAL LOW (ref 20.0–28.0)
O2 Saturation: 92.6 %
Patient temperature: 37
pCO2, Ven: 35 mm[Hg] — ABNORMAL LOW (ref 44–60)
pH, Ven: 7.27 (ref 7.25–7.43)
pO2, Ven: 63 mm[Hg] — ABNORMAL HIGH (ref 32–45)

## 2023-11-06 LAB — PHOSPHORUS: Phosphorus: 4.7 mg/dL — ABNORMAL HIGH (ref 2.5–4.6)

## 2023-11-06 LAB — MAGNESIUM: Magnesium: 2.5 mg/dL — ABNORMAL HIGH (ref 1.7–2.4)

## 2023-11-06 LAB — APTT
aPTT: 64 s — ABNORMAL HIGH (ref 24–36)
aPTT: 81 s — ABNORMAL HIGH (ref 24–36)

## 2023-11-06 MED ORDER — INSULIN GLARGINE-YFGN 100 UNIT/ML ~~LOC~~ SOLN
55.0000 [IU] | Freq: Two times a day (BID) | SUBCUTANEOUS | Status: DC
  Administered 2023-11-06 – 2023-11-07 (×2): 55 [IU] via SUBCUTANEOUS
  Filled 2023-11-06 (×3): qty 0.55

## 2023-11-06 MED ORDER — TRACE MINERALS CU-MN-SE-ZN 300-55-60-3000 MCG/ML IV SOLN
INTRAVENOUS | Status: AC
  Filled 2023-11-06 (×2): qty 2000

## 2023-11-06 MED ORDER — FAT EMUL FISH OIL/PLANT BASED 20% (SMOFLIPID)IV EMUL
250.0000 mL | INTRAVENOUS | Status: AC
  Administered 2023-11-06: 250 mL via INTRAVENOUS
  Filled 2023-11-06: qty 250

## 2023-11-06 MED ORDER — IOHEXOL 350 MG/ML SOLN
75.0000 mL | Freq: Once | INTRAVENOUS | Status: AC | PRN
  Administered 2023-11-06: 75 mL via INTRAVENOUS

## 2023-11-06 NOTE — Progress Notes (Signed)
PHARMACY - ANTICOAGULATION CONSULT NOTE  Pharmacy Consult for argatroban Indication:  r/o HIT  Allergies  Allergen Reactions   Asa [Aspirin] Other (See Comments)    Stomach pain   Crestor [Rosuvastatin] Other (See Comments)    Stomach pain   Heparin Other (See Comments)    Heparin antibody positive, SRA pending   Morphine Nausea And Vomiting   Wellbutrin [Bupropion] Other (See Comments)    Stomach pain   Zoloft [Sertraline Hcl] Other (See Comments)    Stomach pain    Patient Measurements: Height: 6' (182.9 cm) Weight: 80.5 kg (177 lb 7.5 oz) IBW/kg (Calculated) : 77.6  Vital Signs: Temp: 99.2 F (37.3 C) (11/19 1918) Temp Source: Oral (11/19 1918) BP: 104/76 (11/19 1945) Pulse Rate: 102 (11/19 1945)  Labs: Recent Labs    11/04/23 0325 11/04/23 0341 11/04/23 1156 11/05/23 0407 11/05/23 1815 11/06/23 0515 11/06/23 2023  HGB 8.9*  --   --  8.5*  --  9.6*  --   HCT 27.5*  --   --  27.2*  --  31.6*  --   PLT 37*  --   --  64*  --  86*  --   APTT  --   --    < > 79* 60* 64* 81*  LABPROT  --   --   --  24.4*  --  24.6*  --   INR  --   --   --  2.2*  --  2.2*  --   HEPARINUNFRC  --  0.27*  --   --   --   --   --   CREATININE 1.67*  --   --  1.74*  --  2.05*  --    < > = values in this interval not displayed.    Estimated Creatinine Clearance: 35.2 mL/min (A) (by C-G formula based on SCr of 2.05 mg/dL (H)).   Assessment: 71 y.o. M on heparin for portal vein thrombosis and possible PE (seen on CT abdomen). Switched to argatroban for r/o HIT. Heparin antibody positive, SRA has been ordered.   aPTT 81 sec (therapeutic) on argatroban 0.5 mcg/kg/min. HgB 9.6 and PLTs 86. No issues noted with bleeding.    Goal of Therapy:  aPTT 50-90 seconds Monitor platelets by anticoagulation protocol: Yes   Plan:  Continue argatroban 0.5 mcg/kg/min Q12h aPTT given critical illness F/u SRA  F/u imaging to confirm PE / PVT.   Ruben Im, PharmD Clinical  Pharmacist 11/06/2023 9:05 PM Please check AMION for all [facility] Pharmacy numbers

## 2023-11-06 NOTE — Progress Notes (Signed)
NAME:  Matthew Molina, MRN:  [id number], DOB:  Mar 13, 1951, LOS: 14 ADMISSION DATE:  10/23/2023, CONSULTATION DATE:  11/14 REFERRING MD:  Dr. Arbutus Leas, CHIEF COMPLAINT:  septic shock   History of Present Illness:  Patient is a 72 yo M w/ pertinent PMH IgG MGUS, COPD, DMT2, HTN, HLD presents to [facility] on 11/5 w/ abd pain, N/V.  Patient recently admitted to hospital on 10/13 w/ N/V and abd pain. Treated for aspiration pna. GI performed EGD showing grade B esophagitis, duodenitis, and gastric ulcer nonbleeding. Discharged 10/16 w/ PPI and sucralfate.   Patient began having gradual worsening abd pain over the past week. Also having some N/V and decreased po intake. Generalized weakness. Denies sob, diarrhea, chest pain. Came to [facility] on 11/5. On arrival hemodynamically stable and sats 92% on room air. Lipase 84. CT abd/pelvis w/ contrast marked distention of the stomach to the level of the duodenum where there is circumferential thickening and heterogenous hypoattenuation at the site of the patient's previous duodenitis; new hypodensity upstream portal vein suspicious for nonocclusive thrombus and partially imaged hypodensity in the right upper lobe pulmonary artery which may represent artifact versus PE. Started on heparin. GI and surgery consulted. Patient underwent gastrojejunostomy for concern of gastric outlet obstruction secondary to PUD/duodenitis on 10/26/2023. Patient started on TPN for short period and stopped 11/13.   Overnight on 11/13 patient with increased respiratory distress and AMS. Having some abd pain. CT head 2.3 cm hypodensity involving the superior right insula/right frontal operculum, age indeterminate, but could reflect an evolving acute ischemic infarct.  CT abd/pelvis w/ contrast showing small amount of free air throughout abd; new dilated gallbladder w/ multiple gallstones concerning for cholecystits. Surgery consulted and no intervention at this time. Patient w/ increasing AMS and required  intubation. Patient also w/ hypotension requiring levophed. Plan to transfer to [facility] and PCCM consulted.  Pertinent  Medical History   Past Medical History:  Diagnosis Date   Acute blood loss anemia 01/05/2020   Acute respiratory failure with hypoxia (HCC)    Arthritis    COPD (chronic obstructive pulmonary disease) (HCC)    Depression    Diabetes mellitus    Diabetes mellitus without complication (HCC)    Diverticulitis    Elevated PSA 10/01/2016   Encounter for support and coordination of transition of care 01/14/2020   Hypercholesterolemia    Hyperlipidemia    Hypertension    Insomnia 01/05/2016   Ischemic colitis (HCC) 01/05/2020   Multiple lung nodules on CT 04/02/2015   Nicotine addiction    Obesity    Oxygen deficiency    qhs   Schizophrenia (HCC)    Significant Hospital Events: Including procedures, antibiotic start and stop dates in addition to other pertinent events   11/5 admit to [facility]  Patient underwent gastrojejunostomy for concern of gastric outlet obstruction secondary to PUD/duodenitis on 10/26/2023 11/13 change in MS. Concern for acute stroke  11/14 transferred to [facility] for respiratory failure and septic shock. Intubated on levo. Pccm consulted 11/15 On propofol, fentanyl Requiring vasopressin and norepinephrine Unresponsive, Cough, gag, corneals present 11/16 follow-up CT brain expected findings of acute stroke, no hemorrhage. 11/17: Attempting sedation further.  Heparin stopped due to severe thrombocytopenia HIT panel sent.  Added argatroban 11/18: Attempting to wean sedation and levo  Interim History / Subjective:  febrile. On 3 mcg norepinephrine. On minimal sedation and not responsive. Looks uncomfortable with breaths.  Objective   Blood pressure (!) 83/66, pulse 97, temperature (!) 101.2 F (38.4  C), temperature source Axillary, resp. rate (!) 23, height 6' (1.829 m), weight 80.5 kg, SpO2 96%.    Vent Mode: PRVC FiO2 (%):  [50 %-60 %] 60 % Set Rate:   [20 bmp-26 bmp] 20 bmp Vt Set:  [403 mL] 620 mL PEEP:  [5 cmH20] 5 cmH20 Plateau Pressure:  [16 cmH20-24 cmH20] 16 cmH20   Intake/Output Summary (Last 24 hours) at 11/06/2023 0806 Last data filed at 11/06/2023 0600 Gross per 24 hour  Intake 2892.53 ml  Output 3550 ml  Net -657.47 ml   Filed Weights   11/05/23 0530 11/06/23 0401 11/06/23 0404  Weight: 83.7 kg 85.4 kg 80.5 kg    Examination: Gen:      Intubated, sedated, acutely and chronically ill appearing HEENT:  ETT to vent Lungs:    sounds of mechanical ventilation auscultated no wheeze CV:         tachycardic, regular Abd:      Soft, nonteder Ext:    No edema Skin:      Warm and dry; no rashes Neuro:   sedated, RASS -5   Labs and imaging reviewed VBG 7.27/PCO2 35 PO2 63 Na 136 K 3.6 CO2 17 Phos 4.7 Mg 2.5 Alb 1.7 WBC 18.6 Hgb 9.6  Resolved Hospital Problem list     Assessment & Plan:   Acute metabolic encephalopathy due to sepsis and metabolic acidosis complicated by  Acute ischemic stroke 11/13 w/ f/u imaging showing mult acute infarcts but no hemorrhagic conversion  Plan Cont supportive care  PRN fent  Dex for RASS 0 to -1  Given presence of petechial hemorrhage neuro NOT recommending antiplatelets w/ the heparin  Close obs of neuro status w/ increased risk of ICH PT/OT/rehab svc as able   Severe distal duodenal obstruction s/p gastrojejunostomy 11/8 Cholelithiasis -Surgery continues to follow. Abd US obtained 11/15 sm amt sludge no murphy's sign  Plan Abx per above Cont ppi  Keep strict NPO TPN per surgical team  Cont LIWS (once extubated will have to have OGT placed nasal) Discussed with surgery this morning. At this point functional recovery likely on the order of weeks to months if at all. During that time will be TPN dependent.   Septic shock due to asp PNA and probable bacterial translocation from gut w/ on-going drug induced hypotension  Plan Keep euvolemic volume status Wean Norepi for MAP  >65 Cont stress dose steroids. While on vasopressors Cont tele  Acute hypoxemic respiratory failure due to aspiration PNA  Ventilator dependence - lung protective ventilation  History of obstructive lung disease Plan Continue Brovana, continue Pulmicort and DuoNeb  Portal vein thrombosis Concern for PE Plan Holding heparin given severe thrombocytopenia started argatroban 11/17 Intermittent CBC   Severe thrombocytopenia with concern for HIT Plan Continue argatroban Monitor platelets Seratonin release assay for HIT pending  Anemia of critical illness No current evidence of bleeding Plan Am cbc Trigger for transfusion hgb < 7  Chronic kidney disease stage IIIa Plan Renal dose meds to continue  Strict I&O Trend BMET  Type II DM w/ Steroid induced hyperglycemia  Goal 140-180 Increased semglee Cont resistant ssi   Fluid and electrolyte imbalance: hyponatremia, boarderline hypokalemia, hypophosphatemia, NAGMA Acid base slowly improving Plan Replace K & PO4 Am chem   Hypertension Hyperlipidemia plan Hold antihypertensives at present Continue statin  Depression/anxiety plan Home meds on hold, takes Buspar, risperdal and trazedone at home    Best Practice (right click and "Reselect all SmartList Selections" daily)   Diet/type: NPO-->TPA  DVT prophylaxis: SCD heparin discontinued on 11/17 due to thrombocytopenia, argatroban started 11/17 GI prophylaxis: PPI Lines: N/A Foley:  N/A Code Status:  full code Last date of multidisciplinary goals of care discussion [daughter updated 11/19 by phone. Family meeting pending later today]  The patient is critically ill due to shock, respiratory failure.  Critical care was necessary to treat or prevent imminent or life-threatening deterioration.  Critical care was time spent personally by me on the following activities: development of treatment plan with patient and/or surrogate as well as nursing, discussions with  consultants, evaluation of patient's response to treatment, examination of patient, obtaining history from patient or surrogate, ordering and performing treatments and interventions, ordering and review of laboratory studies, ordering and review of radiographic studies, pulse oximetry, re-evaluation of patient's condition and participation in multidisciplinary rounds.   Critical Care Time devoted to patient care services described in this note is 46 minutes. This time reflects time of care of this signee Charlott Holler . This critical care time does not reflect separately billable procedures or procedure time, teaching time or supervisory time of PA/NP/Med student/Med Resident etc but could involve care discussion time.       Charlott Holler [place] [facility] 11/06/2023 10:15 AM  Pager: see AMION  If no response to pager , please call critical care on call (see AMION) until 7pm After 7:00 pm call Elink

## 2023-11-06 NOTE — Progress Notes (Signed)
Following family meeting notified by patient's nurse that family is not ready to proceed with comfort measures at this time he has been made DNR due to their concern for the patient's suffering. At this point concern for questionable PE in a patient with cardioembolic stroke with risk for hemorrhagic transformation. Discussed with neurology that we need to definitively r/o PE so that we can discontinue therapeutic anticoagulation (as long as HIT negative.) will order CTPE study. Will need to revisit goals of care ongoing with patient's daughter 11/20.  Durel Salts, MD Pulmonary and Critical Care Medicine [facility] 11/06/2023 3:26 PM Pager: see AMION  If no response to pager, please call critical care on call (see AMION) until 7pm After 7:00 pm call Elink

## 2023-11-06 NOTE — Progress Notes (Signed)
PHARMACY - TOTAL PARENTERAL NUTRITION CONSULT NOTE  Indication:  Gastric outlet obstruction  Patient Measurements: Height: 6' (182.9 cm) Weight: 80.5 kg (177 lb 7.5 oz) IBW/kg (Calculated) : 77.6 TPN AdjBW (KG): 87.2 Body mass index is 24.07 kg/m.  Assessment:  78 YOM with GOO from PUD stricture/duodenitis that is preventing him from passing any food or drink, causing abdominal distention, pain, N/V.  Underwent gastrojejunostomy on 11/8 and diet gradually advanced to full liquid on 11/13.   UGI showed inability to pass through gastrojejunostomy.  He was on TPN 11/8 >> 11/13.    Patient was transferred to the ICU on 11/13 due to AMS and apnea.  CT showed AIS and free air in abdomen, dilated gallbladder, possible cholecystitis and possible hematoma/mass/infection. CCS think gallbladder is unremarkable.  Patient was transferred to [facility] on 11/14.  Pharmacy consulted to resume TPN.  Glucose / Insulin: hx DM2 - CBGs 172-265 Stress steroid initiated 11/14 > reduce to daily 11/17 (CCM plans to taper steroid over one week once off pressor) Insulin gtt briefly 11/14  Used 44 units SSI in the past 24 hrs and Semglee 50 units BID Electrolytes: K:3.6, CO2: 17, phos: 4.7, Mag: 2.5, CoCa elevated at 12.0, others WNL Renal: SCr increased 2.05, BUN up to 115 Hepatic: LFTs / tbili / TG WNL, albumin 1.7, TG: 140  Intake / Output; MIVF: UOP 0.9 ml/kg/hr, last Lasix 11/14, NG - (bilious), stool not charted  GI Imaging: 11/6 EGD: showed distal duodenal obstruction 11/11 upper GI: contrast was not moving down GJ, no passage via anastomosis  11/12 KUB: contrast within small bowel and colon 11/13 CT: free air in abdomen, dilated gallbladder, possible cholecystitis and possible hematoma/mass/infection 11/15 abd U/S: small amount of sludge within gallbladder, minimal ascites GI Surgeries / Procedures: none since TPN initiation   Central access: PICC placed 10/26/23 TPN start date: 10/26/23 >> 10/31/23,  restarting 11/15 >>  Nutritional Goals: RD Estimated Needs Total Energy Estimated Needs: 2300-2600 Total Protein Estimated Needs: 130-145 gm Total Fluid Estimated Needs: 2.3-2.6 L  Current Nutrition:  TPN  Plan:  Unable to meet needs given limitations of pre-mixed Clinimix.  Discussed with MD patient's rising BUN in the setting of exceeding AA goals for several days. Will reduce rate of TPN to decrease protein to 134g (~17% reduction in AA, 157 >> 134).  Adjust Clinimix 8/10 rate to 70 ml/hr and SMOFlipid at 20.8 ml/hr x 12 hrs to provide 1608 kCal and 134g AA, meeting 70% of kCal and exceeding AA goal by 1%.   Add standard MVI and trace elements to TPN Continue resistant SSI Q4H and increase Semglee to 55 units SQ BID, f/u ability to wean steroids, will likely need a decrease in Semglee once steroids go down. Noted that carbohydrate content is decreasing with decreased rate however patient has been persistently above 180.   TPN labs in AM with worsening AKI and changes in protein content.   Estill Batten, PharmD, BCCCP  11/06/2023, 7:57 AM

## 2023-11-06 NOTE — Progress Notes (Addendum)
eLink Physician-Brief Progress Note Patient Name: Matthew Molina DOB: Feb 13, 1951 MRN: [id number]   Date of Service  11/06/2023  HPI/Events of Note  72 yo M w/ pertinent PMH IgG MGUS, COPD, DMT2, HTN, HLD presents to [facility] on 11/5 w/ abd pain, N/V.  Progressive respiratory failure with hypoxemia secondary to aspiration pneumonia.  Has concurrent septic shock and severe duodenal obstruction complicated by acute encephalopathy and stroke.  Multiorgan dysfunction.  Ongoing goals of care discussions.  Minimal lateral metabolic acidosis on metabolic panel.  Worsening uremia.  Abnormal breathing pattern on the ventilator.    eICU Interventions  Agree with enteral decompression on low intermittent suction.  Ongoing bilious output  Add VBG for the morning check.  Switched vent to auto mode.   4 - Had a bout of emesis of similar gastric contents. Will obtain KUB. Continue enteral decompression (>2L out so far). Maintain compazine.  Intervention Category Minor Interventions: Clinical assessment - ordering diagnostic tests  Rivan Siordia 11/06/2023, 12:02 AM

## 2023-11-06 NOTE — Progress Notes (Signed)
PHARMACY - ANTICOAGULATION CONSULT NOTE  Pharmacy Consult for argatroban Indication:  r/o HIT  Allergies  Allergen Reactions   Asa [Aspirin] Other (See Comments)    Stomach pain   Crestor [Rosuvastatin] Other (See Comments)    Stomach pain   Heparin Other (See Comments)    Heparin antibody positive, SRA pending   Morphine Nausea And Vomiting   Wellbutrin [Bupropion] Other (See Comments)    Stomach pain   Zoloft [Sertraline Hcl] Other (See Comments)    Stomach pain    Patient Measurements: Height: 6' (182.9 cm) Weight: 80.5 kg (177 lb 7.5 oz) IBW/kg (Calculated) : 77.6  Vital Signs: Temp: 101.2 F (38.4 C) (11/19 0746) Temp Source: Axillary (11/19 0746) BP: 83/66 (11/19 0646) Pulse Rate: 97 (11/19 0646)  Labs: Recent Labs    11/04/23 0325 11/04/23 0341 11/04/23 1156 11/05/23 0407 11/05/23 1815 11/06/23 0515  HGB 8.9*  --   --  8.5*  --  9.6*  HCT 27.5*  --   --  27.2*  --  31.6*  PLT 37*  --   --  64*  --  86*  APTT  --   --    < > 79* 60* 64*  LABPROT  --   --   --  24.4*  --  24.6*  INR  --   --   --  2.2*  --  2.2*  HEPARINUNFRC  --  0.27*  --   --   --   --   CREATININE 1.67*  --   --  1.74*  --  2.05*   < > = values in this interval not displayed.    Estimated Creatinine Clearance: 35.2 mL/min (A) (by C-G formula based on SCr of 2.05 mg/dL (H)).   Assessment: 72 y.o. M on heparin for portal vein thrombosis and possible PE (seen on CT abdomen). Switched to argatroban for r/o HIT. Heparin antibody positive, SRA has been ordered.   aPTT 64 sec (therapeutic) on argatroban 0.5 mcg/kg/min. HgB 9.6 and PLTs 86. No issues noted with bleeding.    Goal of Therapy:  aPTT 50-90 seconds Monitor platelets by anticoagulation protocol: Yes   Plan:  Continue argatroban 0.5 mcg/kg/min Q12h aPTT given critical illness F/u SRA  F/u imaging to confirm PE / PVT.   Estill Batten, PharmD, BCCCP  Please see amion for complete clinical pharmacist phone  list 11/06/2023,7:54 AM

## 2023-11-06 NOTE — Progress Notes (Signed)
Pt transported to and from CT scan on the ventilator without incident.

## 2023-11-06 NOTE — IPAL (Signed)
  Interdisciplinary Goals of Care Family Meeting   Date carried out: 11/06/2023  Location of the meeting: Conference room  Member's involved: Physician, Bedside Registered Nurse, Social Worker, and Family Member or next of kin  Durable Power of Attorney or acting medical decision maker: Daughter Dinetta who defers to consensus of family including patient's siblings.   Discussion: We discussed goals of care for Matthew Molina .  Matthew Molina was "good" health with acceptable quality of life in that he was living independently, able to talk to friends/family on the phone.  We discussed that while he may have had a good quality of life, he did have some very sick his chronic medical conditions including COPD with ongoing tobacco use, diabetes, hypertension, chronic kidney disease.  He had also had several hospitalizations in this calendar year.  We reviewed that he has multiple medical issues at present but the most pressing are his gastric outlet obstruction requiring parenteral nutrition and his large territory embolic infarct.  He is not showing signs of wakefulness despite holding sedation.  We reviewed that should we pursue long-term critical illness that would require tracheostomy, dependence on parenteral nutrition, with potential consideration in the future for a feeding tube.  This would require him to be in a long-term acute care hospital.  I think that with his stroke in the expected deficits from this, he would never return home.  I have reviewed these with the recommendations from neurology and that surgery has suggested that it may take months if ever that his abdominal process resolves.  We also reviewed CODE STATUS and the patient's family seemed concerned for suffering and would not want to put him through CPR.  They are considering the options of long-term critical illness versus transitioning to comfort measures.  Code status:   Code Status: Do not attempt resuscitation (DNR) PRE-ARREST  INTERVENTIONS DESIRED   Disposition: Continue current acute care  Time spent for the meeting: 35 minutes      Charlott Holler, MD  11/06/2023, 1:47 PM

## 2023-11-06 NOTE — Progress Notes (Addendum)
STROKE TEAM PROGRESS NOTE   BRIEF HPI Mr. GEROGE Molina is a 72 y.o. male with history of PMH IgG MGUS, COPD, DMT2, HTN, HLD presents to [facility] on 11/5 w/ abd pain, N/V.   He started to have gradual worsening of his abdominal pain over the past week and some nausea vomiting and decreased p.o. intake as well as generalized weakness and presented to [facility] on 10/23/2023.  CT abdomen pelvis with marked distention of stomach at the level of the duodenum and new hypodensity upstream portal vein suspicious for nonocclusive thrombus and partially imaged hypodensity in the right upper lobe pulmonary artery which was artifact versus PE.  Started on heparin.  GI and surgery consulted.  Underwent gastrojejunostomy for concern of gastric outlet obstruction secondary to PUD/duodenitis on 10/26/2023.  Started on TPN.  On 1113, had increased respiratory distress and AMS.  Worsening abdominal pain.  CT head with a hypodensity in the superior right insula but could reflect air throughout the abdomen, new dilated gallbladder with multiple gallstones concerning for cholecystitis.  Surgery was consulted with no intervention.  Patient had increasing her mental status and required intubation.  Also had extensive hypotension requiring Levophed and was transferred to [facility]. MRI was completed that showed multifocal infarcts with the largest 1 being in the right cerebellum concerning for a central etiology. Neurology consulted. Patient unable to provide any history at this time-intubated, on sedation with Precedex and fentanyl.  SIGNIFICANT HOSPITAL EVENTS 11/5 admit to [facility] 11/14 transferred to [facility] for respiratory failure and septic shock. Intubated on levo. Pccm consulted 11/19: Goals of care conversation held with family by CCM, family to consider continued aggressive care versus comfort measures  INTERIM HISTORY/SUBJECTIVE Patient remains intubated, fentanyl has been stopped but Precedex continues.   Neurological exam is slightly improved from yesterday, and family is discussing goals of care.   OBJECTIVE  CBC    Component Value Date/Time   WBC 18.6 (H) 11/06/2023 0515   RBC 3.61 (L) 11/06/2023 0515   HGB 9.6 (L) 11/06/2023 0515   HGB 9.6 (L) 10/12/2023 1607   HCT 31.6 (L) 11/06/2023 0515   HCT 30.4 (L) 10/12/2023 1607   PLT 86 (L) 11/06/2023 0515   PLT 280 10/12/2023 1607   MCV 87.5 11/06/2023 0515   MCV 90 10/12/2023 1607   MCH 26.6 11/06/2023 0515   MCHC 30.4 11/06/2023 0515   RDW 16.9 (H) 11/06/2023 0515   RDW 14.0 10/12/2023 1607   LYMPHSABS 1.1 11/06/2023 0515   LYMPHSABS 2.1 06/23/2021 1058   MONOABS 1.1 (H) 11/06/2023 0515   EOSABS 0.0 11/06/2023 0515   EOSABS 0.4 06/23/2021 1058   BASOSABS 0.0 11/06/2023 0515   BASOSABS 0.0 06/23/2021 1058    BMET    Component Value Date/Time   NA 136 11/06/2023 0515   NA 140 10/12/2023 1607   K 3.6 11/06/2023 0515   CL 110 11/06/2023 0515   CO2 17 (L) 11/06/2023 0515   GLUCOSE 208 (H) 11/06/2023 0515   BUN 115 (H) 11/06/2023 0515   BUN 15 10/12/2023 1607   CREATININE 2.05 (H) 11/06/2023 0515   CREATININE 1.38 (H) 05/26/2020 1149   CALCIUM 10.2 11/06/2023 0515   EGFR 59 (L) 10/12/2023 1607   GFRNONAA 34 (L) 11/06/2023 0515   GFRNONAA 51 (L) 05/26/2020 1149    IMAGING past 24 hours DG Abd 1 View  Result Date: 11/06/2023 CLINICAL DATA:  Small bowel obstruction. EXAM: ABDOMEN - 1 VIEW COMPARISON:  November 02, 2023.  FINDINGS: Distal tip of nasogastric tube is seen in expected position of distal stomach. No abnormal bowel dilatation is noted. Contrast is noted in nondilated colon. IMPRESSION: No abnormal bowel dilatation. Electronically Signed   By: Lupita Raider M.D.   On: 11/06/2023 07:57    Vitals:   11/06/23 1130 11/06/23 1138 11/06/23 1145 11/06/23 1200  BP: 91/72  102/72 95/68  Pulse: (!) 104  (!) 101 (!) 105  Resp: (!) 22  (!) 25 (!) 21  Temp:  99.9 F (37.7 C)    TempSrc:  Oral    SpO2: 97%  98% 98%   Weight:      Height:         NEURO: on low-dose precedex  Mental Status:   Eyes are closed.  Unresponsive.  Does not respond to sternal rub.  Pupils small and sluggishly reactive, positive oculocephalic reflex, unable to follow commands, no response to noxious stimuli, positive corneal reflexes, positive cough and gag.  No spontaneous motor response or even reflex response to pain.        ASSESSMENT/PLAN  Acute Ischemic Infarct:  right cerebellum, left cerebellum, right frontal operculum, bilateral frontal, parietal and occipital lobes. Etiology:  Cardioembolic in the setting of shock and portal vein thrombosis CT head 10/31/2023: 2.3 cm hypodensity seen involving the superior right insula/right frontal operculum.    CTA head & neck  MRI   Large acute infarct in the superior right cerebellum, with smaller acute infarcts in the left cerebellum. The right cerebellar infarct is associated with petechial hemorrhage 2. Cortical acute infarct in the right frontal operculum correlates with the hypodensity suspected on the 10/31/2023 CT. Additional primarily cortical acute infarcts in the bilateral frontal, parietal, and occipital lobes. Daughter updated via phone on 11/04/23 Repeat head CT 11/17: Multiple acute infarcts with no hemorrhagic conversion or gross progression, no hydrocephalus 2D Echo EF > 75% May consider TEE if aggressive measures desired after goals of care conversation with family LDL 35 HgbA1c 6.0 VTE prophylaxis -fully anticoagulated with argatroban Therapy recommendations:  Pending Disposition:  Pending   Hx of Stroke/TIA None   Hypertension Home meds:  None  Stable Blood Pressure Goal: Normotension   Hyperlipidemia Home meds:  None  LDL 35, goal < 70 High intensity statin not indicated   Diabetes type II: None  HgbA1c 6.0, goal < 7.0  Dysphagia Patient has post-stroke dysphagia, SLP consulted    Diet   Diet NPO time specified   Advance diet as  tolerated  Respiratory failure with aspiration pneumonia Patient intubated 11/14 Ventilator management and antibiotics per CCM Extubate when able  Concern for PE and heparin-induced thrombocytopenia Goals of care discussion pending, CM will obtain CTA if aggressive measures desired Continue anticoagulation with argatroban  Septic shock and concern for endocarditis Patient is in septic shock due to aspiration pneumonia and possible bacterial translocation from gut Continue norepinephrine per CCM Continue stress dose steroids Consider TEE if aggressive measures desired  Other Active Problems Severe duodenal obstruction Portal vein thrombosis   Hospital day # 14  Patient seen by NP and then by MD, MD to edit note as needed. Cortney E Ernestina Columbia , MSN, AGACNP-BC [facility] See Amion for schedule and pager information 11/06/2023 2:01 PM  I have personally obtained history,examined this patient, reviewed notes, independently viewed imaging studies, participated in medical decision making and plan of care.ROS completed by me personally and pertinent positives fully documented  I have made any additions or clarifications directly  to the above note. Agree with note above.  Patient neurological exam remains very poor and limited due to being sedated.  His unreactive pupils and very sluggish corneal flexes and very mild cough and motor response to painful stimuli.  Family meeting with palliative care to discuss goals of care.  We will hold off on TEE.  Discussed with Dr. Celine Mans critical care medicine.This patient is critically ill and at significant risk of neurological worsening, death and care requires constant monitoring of vital signs, hemodynamics,respiratory and cardiac monitoring, extensive review of multiple databases, frequent neurological assessment, discussion with family, other specialists and medical decision making of high complexity.I have made any additions or  clarifications directly to the above note.This critical care time does not reflect procedure time, or teaching time or supervisory time of PA/NP/Med Resident etc but could involve care discussion time.  I spent 30 minutes of neurocritical care time  in the care of  this patient.      Delia Heady, MD Medical Director [facility] Stroke Center Pager: [phone] 11/06/2023 2:33 PM    To contact Stroke Continuity provider, please refer to [email or web address]. After hours, contact General Neurology

## 2023-11-06 NOTE — Progress Notes (Signed)
11 Days Post-Op   Subjective/Chief Complaint: Intubated, sedated   Objective: Vital signs in last 24 hours: Temp:  [97.1 F (36.2 C)-101.2 F (38.4 C)] 101.2 F (38.4 C) (11/19 0746) Pulse Rate:  [73-118] 97 (11/19 0646) Resp:  [18-33] 23 (11/19 0714) BP: (81-126)/(59-86) 83/66 (11/19 0646) SpO2:  [89 %-98 %] 96 % (11/19 0646) FiO2 (%):  [50 %-60 %] 60 % (11/19 0714) Weight:  [80.5 kg-85.4 kg] 80.5 kg (11/19 0404) Last BM Date : 11/03/23  Intake/Output from previous day: 11/18 0701 - 11/19 0700 In: 3153.3 [I.V.:3006.6; IV Piggyback:146.7] Out: 3750 [Urine:1800; Emesis/NG output:1950] Intake/Output this shift: Total I/O In: 264.4 [I.V.:239.3; IV Piggyback:25] Out: 250 [Urine:250]  Ab soft nondistended incision without infection  Lab Results:  Recent Labs    11/05/23 0407 11/06/23 0515  WBC 14.9* 18.6*  HGB 8.5* 9.6*  HCT 27.2* 31.6*  PLT 64* 86*   BMET Recent Labs    11/05/23 0407 11/06/23 0515  NA 135 136  K 3.6 3.6  CL 105 110  CO2 19* 17*  GLUCOSE 202* 208*  BUN 93* 115*  CREATININE 1.74* 2.05*  CALCIUM 10.1 10.2   PT/INR Recent Labs    11/05/23 0407 11/06/23 0515  LABPROT 24.4* 24.6*  INR 2.2* 2.2*   ABG Recent Labs    11/06/23 0515  HCO3 16.1*    Studies/Results: DG Abd 1 View  Result Date: 11/06/2023 CLINICAL DATA:  Small bowel obstruction. EXAM: ABDOMEN - 1 VIEW COMPARISON:  November 02, 2023. FINDINGS: Distal tip of nasogastric tube is seen in expected position of distal stomach. No abnormal bowel dilatation is noted. Contrast is noted in nondilated colon. IMPRESSION: No abnormal bowel dilatation. Electronically Signed   By: Lupita Raider M.D.   On: 11/06/2023 07:57    Anti-infectives: Anti-infectives (From admission, onward)    Start     Dose/Rate Route Frequency Ordered Stop   11/02/23 1500  vancomycin (VANCOREADY) IVPB 1250 mg/250 mL  Status:  Discontinued        1,250 mg 166.7 mL/hr over 90 Minutes Intravenous Every 24  hours 11/01/23 1508 11/02/23 1339   11/02/23 1339  vancomycin variable dose per unstable renal function (pharmacist dosing)  Status:  Discontinued         Does not apply See admin instructions 11/02/23 1339 11/03/23 1501   11/01/23 1500  vancomycin (VANCOREADY) IVPB 1750 mg/350 mL        1,750 mg 175 mL/hr over 120 Minutes Intravenous  Once 11/01/23 1424 11/01/23 1656   10/31/23 2200  piperacillin-tazobactam (ZOSYN) IVPB 3.375 g        3.375 g 12.5 mL/hr over 240 Minutes Intravenous Every 8 hours 10/31/23 1753 11/07/23 2159   10/26/23 1400  ceFAZolin (ANCEF) IVPB 2g/100 mL premix        2 g 200 mL/hr over 30 Minutes Intravenous On call to O.R. 10/26/23 1341 10/26/23 1558   10/26/23 1343  ceFAZolin (ANCEF) 2-4 GM/100ML-% IVPB       Note to Pharmacy: Sherren Kerns H: cabinet override      10/26/23 1343 10/26/23 1559       Assessment/Plan: POD 11 s/p Gastrojejunostomy 10/26/23 by Dr. Robyne Peers for GOO secondary to PUD stricture/duodenitis and portal vein thrombosis - CT a/p 11/13 shows small amount of free air but no free fluid to indicate a leak or other intraabdominal issue; radiology reports heterogeneous masslike density in the region of the first and second portion of the duodenum with some new central fluid areas  of attenuation that may represent hematoma, mass and/or infection. - OGT output still high-1950  Cont NPO and nothing down tube until we can see persistent evidence of gastric emptying as stomach is likely not emptying through GJ well yet. UGI 11/11 showed no passage through anastomosis this is likely functional but may not be open either.  -once extubated would need OG converted to NGT prior to that.  -if he is unable to eat/be fed enterally at some point a conversation about a g or more appropriately a gj tube would be in order but not right now. -I think his likelihood of returning to quality of life pre this incident is very low Stroke: per neurology, prognosis unclear now    ID - zosyn, vancomycin VTE - heparin gtt on hold due to severe thrombocytopenia, now on argatrogan FEN - IVF, NPO/OGT to LIWS. TPN Foley - none   AMS/acute stroke Shock VDRF DM HTN HLD CAD COPD IgG MGUS   Emelia Loron 11/06/2023

## 2023-11-07 DIAGNOSIS — I81 Portal vein thrombosis: Secondary | ICD-10-CM | POA: Diagnosis not present

## 2023-11-07 DIAGNOSIS — J9601 Acute respiratory failure with hypoxia: Secondary | ICD-10-CM | POA: Diagnosis not present

## 2023-11-07 DIAGNOSIS — A419 Sepsis, unspecified organism: Secondary | ICD-10-CM | POA: Diagnosis not present

## 2023-11-07 DIAGNOSIS — G9341 Metabolic encephalopathy: Secondary | ICD-10-CM | POA: Diagnosis not present

## 2023-11-07 DIAGNOSIS — J449 Chronic obstructive pulmonary disease, unspecified: Secondary | ICD-10-CM

## 2023-11-07 DIAGNOSIS — D696 Thrombocytopenia, unspecified: Secondary | ICD-10-CM

## 2023-11-07 DIAGNOSIS — N179 Acute kidney failure, unspecified: Secondary | ICD-10-CM

## 2023-11-07 LAB — CBC
HCT: 31.4 % — ABNORMAL LOW (ref 39.0–52.0)
Hemoglobin: 9.7 g/dL — ABNORMAL LOW (ref 13.0–17.0)
MCH: 27.5 pg (ref 26.0–34.0)
MCHC: 30.9 g/dL (ref 30.0–36.0)
MCV: 89 fL (ref 80.0–100.0)
Platelets: 174 10*3/uL (ref 150–400)
RBC: 3.53 MIL/uL — ABNORMAL LOW (ref 4.22–5.81)
RDW: 17.1 % — ABNORMAL HIGH (ref 11.5–15.5)
WBC: 25 10*3/uL — ABNORMAL HIGH (ref 4.0–10.5)
nRBC: 0.6 % — ABNORMAL HIGH (ref 0.0–0.2)

## 2023-11-07 LAB — PHOSPHORUS: Phosphorus: 5.9 mg/dL — ABNORMAL HIGH (ref 2.5–4.6)

## 2023-11-07 LAB — PROTIME-INR
INR: 3.2 — ABNORMAL HIGH (ref 0.8–1.2)
Prothrombin Time: 33.3 s — ABNORMAL HIGH (ref 11.4–15.2)

## 2023-11-07 LAB — COMPREHENSIVE METABOLIC PANEL
ALT: 96 U/L — ABNORMAL HIGH (ref 0–44)
AST: 104 U/L — ABNORMAL HIGH (ref 15–41)
Albumin: 1.8 g/dL — ABNORMAL LOW (ref 3.5–5.0)
Alkaline Phosphatase: 56 U/L (ref 38–126)
Anion gap: 13 (ref 5–15)
BUN: 162 mg/dL — ABNORMAL HIGH (ref 8–23)
CO2: 15 mmol/L — ABNORMAL LOW (ref 22–32)
Calcium: 10.6 mg/dL — ABNORMAL HIGH (ref 8.9–10.3)
Chloride: 107 mmol/L (ref 98–111)
Creatinine, Ser: 3.11 mg/dL — ABNORMAL HIGH (ref 0.61–1.24)
GFR, Estimated: 20 mL/min — ABNORMAL LOW (ref >60)
Glucose, Bld: 224 mg/dL — ABNORMAL HIGH (ref 70–99)
Potassium: 3.8 mmol/L (ref 3.5–5.1)
Sodium: 135 mmol/L (ref 135–145)
Total Bilirubin: 0.9 mg/dL (ref <1.2)
Total Protein: 6.5 g/dL (ref 6.5–8.1)

## 2023-11-07 LAB — GLUCOSE, CAPILLARY
Glucose-Capillary: 179 mg/dL — ABNORMAL HIGH (ref 70–99)
Glucose-Capillary: 185 mg/dL — ABNORMAL HIGH (ref 70–99)
Glucose-Capillary: 188 mg/dL — ABNORMAL HIGH (ref 70–99)
Glucose-Capillary: 220 mg/dL — ABNORMAL HIGH (ref 70–99)
Glucose-Capillary: 229 mg/dL — ABNORMAL HIGH (ref 70–99)
Glucose-Capillary: 238 mg/dL — ABNORMAL HIGH (ref 70–99)

## 2023-11-07 LAB — MAGNESIUM: Magnesium: 2.5 mg/dL — ABNORMAL HIGH (ref 1.7–2.4)

## 2023-11-07 LAB — APTT
aPTT: 59 s — ABNORMAL HIGH (ref 24–36)
aPTT: 79 s — ABNORMAL HIGH (ref 24–36)

## 2023-11-07 MED ORDER — TRACE MINERALS CU-MN-SE-ZN 300-55-60-3000 MCG/ML IV SOLN
INTRAVENOUS | Status: AC
  Filled 2023-11-07: qty 2000

## 2023-11-07 MED ORDER — FAT EMUL FISH OIL/PLANT BASED 20% (SMOFLIPID)IV EMUL
250.0000 mL | INTRAVENOUS | Status: AC
Start: 2023-11-07 — End: 2023-11-08
  Administered 2023-11-07: 250 mL via INTRAVENOUS
  Filled 2023-11-07: qty 250

## 2023-11-07 MED ORDER — LACTATED RINGERS IV SOLN: INTRAVENOUS | Status: DC

## 2023-11-07 MED ORDER — INSULIN GLARGINE-YFGN 100 UNIT/ML ~~LOC~~ SOLN
60.0000 [IU] | Freq: Two times a day (BID) | SUBCUTANEOUS | Status: AC
  Administered 2023-11-07 – 2023-11-08 (×2): 60 [IU] via SUBCUTANEOUS
  Filled 2023-11-07 (×3): qty 0.6

## 2023-11-07 NOTE — Progress Notes (Signed)
STROKE TEAM PROGRESS NOTE   BRIEF HPI [name] is a 72 y.o. male with history of PMH IgG MGUS, COPD, DMT2, HTN, HLD presents to [facility] on 11/5 w/ abd pain, N/V.   He started to have gradual worsening of his abdominal pain over the past week and some nausea vomiting and decreased p.o. intake as well as generalized weakness and presented to [facility] on 10/23/2023.  CT abdomen pelvis with marked distention of stomach at the level of the duodenum and new hypodensity upstream portal vein suspicious for nonocclusive thrombus and partially imaged hypodensity in the right upper lobe pulmonary artery which was artifact versus PE.  Started on heparin.  GI and surgery consulted.  Underwent gastrojejunostomy for concern of gastric outlet obstruction secondary to PUD/duodenitis on 10/26/2023.  Started on TPN.  On 1113, had increased respiratory distress and AMS.  Worsening abdominal pain.  CT head with a hypodensity in the superior right insula but could reflect air throughout the abdomen, new dilated gallbladder with multiple gallstones concerning for cholecystitis.  Surgery was consulted with no intervention.  Patient had increasing her mental status and required intubation.  Also had extensive hypotension requiring Levophed and was transferred to [facility]. MRI was completed that showed multifocal infarcts with the largest 1 being in the right cerebellum concerning for a central etiology. Neurology consulted. Patient unable to provide any history at this time-intubated, on sedation with Precedex and fentanyl.  SIGNIFICANT HOSPITAL EVENTS 11/5 admit to [facility] 11/14 transferred to [facility] for respiratory failure and septic shock. Intubated on levo. Pccm consulted 11/19: Goals of care conversation held with family by CCM, family to consider continued aggressive care versus comfort measures  INTERIM HISTORY/SUBJECTIVE Patient remains critically ill and intubated and sedated.  Neurological exam  remains poor with barely responsive pupil and corneal and cough reflexes.  No significant motor response to sternal rub.  Family met with Dr. Celine Mans yesterday discuss goals of care and agreed to DNR want to continue ongoing care for now.   O BJECTIVE  CBC    Component Value Date/Time   WBC 25.0 (H) 11/07/2023 0625   RBC 3.53 (L) 11/07/2023 0625   HGB 9.7 (L) 11/07/2023 0625   HGB 9.6 (L) 10/12/2023 1607   HCT 31.4 (L) 11/07/2023 0625   HCT 30.4 (L) 10/12/2023 1607   PLT 174 11/07/2023 0625   PLT 280 10/12/2023 1607   MCV 89.0 11/07/2023 0625   MCV 90 10/12/2023 1607   MCH 27.5 11/07/2023 0625   MCHC 30.9 11/07/2023 0625   RDW 17.1 (H) 11/07/2023 0625   RDW 14.0 10/12/2023 1607   LYMPHSABS 1.1 11/06/2023 0515   LYMPHSABS 2.1 06/23/2021 1058   MONOABS 1.1 (H) 11/06/2023 0515   EOSABS 0.0 11/06/2023 0515   EOSABS 0.4 06/23/2021 1058   BASOSABS 0.0 11/06/2023 0515   BASOSABS 0.0 06/23/2021 1058    BMET    Component Value Date/Time   NA 135 11/07/2023 0625   NA 140 10/12/2023 1607   K 3.8 11/07/2023 0625   CL 107 11/07/2023 0625   CO2 15 (L) 11/07/2023 0625   GLUCOSE 224 (H) 11/07/2023 0625   BUN 162 (H) 11/07/2023 0625   BUN 15 10/12/2023 1607   CREATININE 3.11 (H) 11/07/2023 0625   CREATININE 1.38 (H) 05/26/2020 1149   CALCIUM 10.6 (H) 11/07/2023 0625   EGFR 59 (L) 10/12/2023 1607   GFRNONAA 20 (L) 11/07/2023 0625   GFRNONAA 51 (L) 05/26/2020 1149  IMAGING past 24 hours CT Angio Chest Pulmonary Embolism (PE) W or WO Contrast  Result Date: 11/06/2023 CLINICAL DATA:  Evaluate for pulmonary embolism. Incomplete evaluation on prior aortic study. EXAM: CT ANGIOGRAPHY CHEST WITH CONTRAST TECHNIQUE: Multidetector CT imaging of the chest was performed using the standard protocol during bolus administration of intravenous contrast. Multiplanar CT image reconstructions and MIPs were obtained to evaluate the vascular anatomy. RADIATION DOSE REDUCTION: This exam was performed  according to the departmental dose-optimization program which includes automated exposure control, adjustment of the mA and/or kV according to patient size and/or use of iterative reconstruction technique. CONTRAST:  75mL OMNIPAQUE IOHEXOL 350 MG/ML SOLN COMPARISON:  10/24/2023, 10/31/2023 FINDINGS: Cardiovascular: Atherosclerotic calcifications of the thoracic aorta are noted. Stable dilatation of 4 cm is noted in the ascending aorta. Normal tapering is noted in the aortic arch. Descending thoracic aorta is within normal limits. No significant cardiac enlargement is noted. Heavy coronary calcifications are seen. The pulmonary artery shows a normal branching pattern bilaterally. No filling defect to suggest pulmonary embolism is noted. Mediastinum/Nodes: Thoracic inlet again demonstrates scattered small thyroid hypodensities. No follow-up is recommended. Endotracheal tube and gastric catheter are noted in satisfactory position. No hilar or mediastinal adenopathy is noted. Lungs/Pleura: Bibasilar atelectatic changes are seen. No sizable effusion is noted. No parenchymal nodules are seen. No pleural effusion is noted. Upper Abdomen: Visualized upper abdomen demonstrates multiple renal cysts similar to that seen on the prior exam. No follow-up is recommended. Mild fluid is noted surrounding the liver new from the prior exam. The gallbladder is within normal limits. Wall thickening in the duodenum is seen this is incompletely evaluated on this exam and stable from previous studies. Musculoskeletal: No acute rib abnormality is noted. Degenerative changes of the thoracic spine are seen. Review of the MIP images confirms the above findings. IMPRESSION: No evidence of pulmonary emboli. Bibasilar atelectatic changes. Subcentimeter incidental thyroid nodules. No follow-up imaging is recommended. Reference: J Am Coll Radiol. 2015 Feb;12(2): 143-50 New free fluid within the abdomen. Persistent duodenal thickening is noted in  the second portion of the duodenum. Electronically Signed   By: Alcide Clever M.D.   On: 11/06/2023 21:56    Vitals:   11/07/23 1129 11/07/23 1144 11/07/23 1200 11/07/23 1300  BP: 98/69 108/76 101/63 99/66  Pulse: (!) 102 (!) 102 (!) 101 (!) 101  Resp: (!) 29 (!) 26 (!) 32 (!) 34  Temp: 98.4 F (36.9 C)     TempSrc: Axillary     SpO2: 97% 97% 97% 98%  Weight:      Height:         NEURO: on low-dose precedex  Mental Status:   Eyes are closed.  Unresponsive.  Does not respond to sternal rub.  Pupils small and sluggishly reactive, positive oculocephalic reflex, unable to follow commands, no response to noxious stimuli, positive corneal reflexes, positive cough and gag.  No spontaneous motor response or even reflex response to pain.        ASSESSMENT/PLAN  Acute Ischemic Infarct:  right cerebellum, left cerebellum, right frontal operculum, bilateral frontal, parietal and occipital lobes. Etiology:  Cardioembolic in the setting of shock but no clear source.   CT head 10/31/2023: 2.3 cm hypodensity seen involving the superior right insula/right frontal operculum.    CTA head & neck  MRI   Large acute infarct in the superior right cerebellum, with smaller acute infarcts in the left cerebellum. The right cerebellar infarct is associated with petechial hemorrhage 2. Cortical acute  infarct in the right frontal operculum correlates with the hypodensity suspected on the 10/31/2023 CT. Additional primarily cortical acute infarcts in the bilateral frontal, parietal, and occipital lobes. Daughter updated via phone on 11/04/23 Repeat head CT 11/17: Multiple acute infarcts with no hemorrhagic conversion or gross progression, no hydrocephalus 2D Echo EF > 75% May consider TEE if aggressive measures desired after goals of care conversation with family LDL 35 HgbA1c 6.0 VTE prophylaxis -fully anticoagulated with argatroban Therapy recommendations:  Pending Disposition:  Pending   Hx of  Stroke/TIA None   Hypertension Home meds:  None  Stable Blood Pressure Goal: Normotension   Hyperlipidemia Home meds:  None  LDL 35, goal < 70 High intensity statin not indicated   Diabetes type II: None  HgbA1c 6.0, goal < 7.0  Dysphagia Patient has post-stroke dysphagia, SLP consulted    Diet   Diet NPO time specified   Advance diet as tolerated  Respiratory failure with aspiration pneumonia Patient intubated 11/14 Ventilator management and antibiotics per CCM Extubate when able  Concern for PE and heparin-induced thrombocytopenia Goals of care discussion pending, CM will obtain CTA if aggressive measures desired Continue anticoagulation with argatroban if CCM feels necessary and switch to aspirin if this is discontinued  Septic shock and concern for endocarditis Patient is in septic shock due to aspiration pneumonia and possible bacterial translocation from gut Continue norepinephrine per CCM Continue stress dose steroids Consider TEE if aggressive measures desired  Other Active Problems Severe duodenal obstruction Portal vein thrombosis   Hospital day # 15   Patient neurological exam remains very poor and an exam is limited due to being sedated.  His unreactive pupils and very sluggish corneal flexes and very mild cough and motor response to painful stimuli.  Family has agreed to DNR but is not willing to withdraw care yet.  We will hold off on TEE.  Discussed with Dr. Merrily Pew critical care medicine.there appears to be no clear definite evidence of pulmonary embolism hence patient may not need anticoagulation as there was concern for portal vein thrombosis on CT scan but ultrasound showed no evidence of that.  Recommend discontinue argatroban and start aspirin.  We will hold off on TEE given patient's poor general condition and will reconsider it in the future only if we show significant meaningful improvement.  No family available at the bedside.  Stroke team will sign  off but will be available if needed to answer any questions   This patient is critically ill and at significant risk of neurological worsening, death and care requires constant monitoring of vital signs, hemodynamics,respiratory and cardiac monitoring, extensive review of multiple databases, frequent neurological assessment, discussion with family, other specialists and medical decision making of high complexity.I have made any additions or clarifications directly to the above note.This critical care time does not reflect procedure time, or teaching time or supervisory time of PA/NP/Med Resident etc but could involve care discussion time.  I spent 30 minutes of neurocritical care time  in the care of  this patient.         Delia Heady, MD Medical Director [facility] Stroke Center Pager: [phone] 11/07/2023 1:06 PM    To contact Stroke Continuity provider, please refer to [email or web address]. After hours, contact General Neurology

## 2023-11-07 NOTE — Progress Notes (Signed)
12 Days Post-Op   Subjective/Chief Complaint: Intubated sedated   Objective: Vital signs in last 24 hours: Temp:  [96.8 F (36 C)-99.9 F (37.7 C)] 98.8 F (37.1 C) (11/20 0734) Pulse Rate:  [93-115] 103 (11/20 0700) Resp:  [19-33] 20 (11/20 0734) BP: (79-125)/(50-84) 97/69 (11/20 0700) SpO2:  [95 %-100 %] 98 % (11/20 0700) FiO2 (%):  [50 %-60 %] 50 % (11/20 0734) Last BM Date : 11/03/23  Intake/Output from previous day: 11/19 0701 - 11/20 0700 In: 2614.6 [I.V.:2464.1; IV Piggyback:150.5] Out: 2350 [Urine:1600; Emesis/NG output:750] Intake/Output this shift: No intake/output data recorded.  Ab soft nondistended incision without infection   Lab Results:  Recent Labs    11/06/23 0515 11/07/23 0625  WBC 18.6* 25.0*  HGB 9.6* 9.7*  HCT 31.6* 31.4*  PLT 86* 174   BMET Recent Labs    11/05/23 0407 11/06/23 0515  NA 135 136  K 3.6 3.6  CL 105 110  CO2 19* 17*  GLUCOSE 202* 208*  BUN 93* 115*  CREATININE 1.74* 2.05*  CALCIUM 10.1 10.2   PT/INR Recent Labs    11/06/23 0515 11/07/23 0625  LABPROT 24.6* 33.3*  INR 2.2* 3.2*   ABG Recent Labs    11/06/23 0515  HCO3 16.1*    Studies/Results: CT Angio Chest Pulmonary Embolism (PE) W or WO Contrast  Result Date: 11/06/2023 CLINICAL DATA:  Evaluate for pulmonary embolism. Incomplete evaluation on prior aortic study. EXAM: CT ANGIOGRAPHY CHEST WITH CONTRAST TECHNIQUE: Multidetector CT imaging of the chest was performed using the standard protocol during bolus administration of intravenous contrast. Multiplanar CT image reconstructions and MIPs were obtained to evaluate the vascular anatomy. RADIATION DOSE REDUCTION: This exam was performed according to the departmental dose-optimization program which includes automated exposure control, adjustment of the mA and/or kV according to patient size and/or use of iterative reconstruction technique. CONTRAST:  75mL OMNIPAQUE IOHEXOL 350 MG/ML SOLN COMPARISON:   10/24/2023, 10/31/2023 FINDINGS: Cardiovascular: Atherosclerotic calcifications of the thoracic aorta are noted. Stable dilatation of 4 cm is noted in the ascending aorta. Normal tapering is noted in the aortic arch. Descending thoracic aorta is within normal limits. No significant cardiac enlargement is noted. Heavy coronary calcifications are seen. The pulmonary artery shows a normal branching pattern bilaterally. No filling defect to suggest pulmonary embolism is noted. Mediastinum/Nodes: Thoracic inlet again demonstrates scattered small thyroid hypodensities. No follow-up is recommended. Endotracheal tube and gastric catheter are noted in satisfactory position. No hilar or mediastinal adenopathy is noted. Lungs/Pleura: Bibasilar atelectatic changes are seen. No sizable effusion is noted. No parenchymal nodules are seen. No pleural effusion is noted. Upper Abdomen: Visualized upper abdomen demonstrates multiple renal cysts similar to that seen on the prior exam. No follow-up is recommended. Mild fluid is noted surrounding the liver new from the prior exam. The gallbladder is within normal limits. Wall thickening in the duodenum is seen this is incompletely evaluated on this exam and stable from previous studies. Musculoskeletal: No acute rib abnormality is noted. Degenerative changes of the thoracic spine are seen. Review of the MIP images confirms the above findings. IMPRESSION: No evidence of pulmonary emboli. Bibasilar atelectatic changes. Subcentimeter incidental thyroid nodules. No follow-up imaging is recommended. Reference: J Am Coll Radiol. 2015 Feb;12(2): 143-50 New free fluid within the abdomen. Persistent duodenal thickening is noted in the second portion of the duodenum. Electronically Signed   By: Alcide Clever M.D.   On: 11/06/2023 21:56   DG Abd 1 View  Result Date: 11/06/2023 CLINICAL DATA:  Small bowel obstruction. EXAM: ABDOMEN - 1 VIEW COMPARISON:  November 02, 2023. FINDINGS: Distal tip of  nasogastric tube is seen in expected position of distal stomach. No abnormal bowel dilatation is noted. Contrast is noted in nondilated colon. IMPRESSION: No abnormal bowel dilatation. Electronically Signed   By: Lupita Raider M.D.   On: 11/06/2023 07:57    Anti-infectives: Anti-infectives (From admission, onward)    Start     Dose/Rate Route Frequency Ordered Stop   11/02/23 1500  vancomycin (VANCOREADY) IVPB 1250 mg/250 mL  Status:  Discontinued        1,250 mg 166.7 mL/hr over 90 Minutes Intravenous Every 24 hours 11/01/23 1508 11/02/23 1339   11/02/23 1339  vancomycin variable dose per unstable renal function (pharmacist dosing)  Status:  Discontinued         Does not apply See admin instructions 11/02/23 1339 11/03/23 1501   11/01/23 1500  vancomycin (VANCOREADY) IVPB 1750 mg/350 mL        1,750 mg 175 mL/hr over 120 Minutes Intravenous  Once 11/01/23 1424 11/01/23 1656   10/31/23 2200  piperacillin-tazobactam (ZOSYN) IVPB 3.375 g        3.375 g 12.5 mL/hr over 240 Minutes Intravenous Every 8 hours 10/31/23 1753 11/07/23 2159   10/26/23 1400  ceFAZolin (ANCEF) IVPB 2g/100 mL premix        2 g 200 mL/hr over 30 Minutes Intravenous On call to O.R. 10/26/23 1341 10/26/23 1558   10/26/23 1343  ceFAZolin (ANCEF) 2-4 GM/100ML-% IVPB       Note to Pharmacy: Sherren Kerns H: cabinet override      10/26/23 1343 10/26/23 1559       Assessment/Plan: POD 12 s/p Gastrojejunostomy 10/26/23 by Dr. Robyne Peers for GOO secondary to PUD stricture/duodenitis and portal vein thrombosis - CT a/p 11/13 shows small amount of free air but no free fluid to indicate a leak or other intraabdominal issue; radiology reports heterogeneous masslike density in the region of the first and second portion of the duodenum with some new central fluid areas of attenuation that may represent hematoma, mass and/or infection. - OGT output still high-1600  Cont NPO and nothing down tube until we can see persistent evidence  of gastric emptying as stomach is likely not emptying through GJ well yet. UGI 11/11 showed no passage through anastomosis this is likely functional but may not be open either.  -once extubated would need OG converted to NGT prior to that.  -if he is unable to eat/be fed enterally at some point a conversation about a g or more appropriately a gj tube would be in order but not right now. -I think his likelihood of returning to quality of life pre this incident is very low Stroke: per neurology, prognosis unclear now   ID - zosyn VTE - heparin gtt on hold due to severe thrombocytopenia, now on argatrogan FEN - IVF, NPO/OGT to LIWS. TPN Foley - none   AMS/acute stroke Shock VDRF DM HTN HLD CAD COPD IgG MGUS  Emelia Loron 11/07/2023

## 2023-11-07 NOTE — Progress Notes (Signed)
NAME:  Matthew Molina, MRN:  [id number], DOB:  Jan 11, 1951, LOS: 15 ADMISSION DATE:  10/23/2023, CONSULTATION DATE:  11/14 REFERRING MD:  Dr. Arbutus Leas, CHIEF COMPLAINT:  septic shock   History of Present Illness:  Patient is a 72 yo M w/ pertinent PMH IgG MGUS, COPD, DMT2, HTN, HLD presents to [facility] on 11/5 w/ abd pain, N/V.  Patient recently admitted to hospital on 10/13 w/ N/V and abd pain. Treated for aspiration pna. GI performed EGD showing grade B esophagitis, duodenitis, and gastric ulcer nonbleeding. Discharged 10/16 w/ PPI and sucralfate.   Patient began having gradual worsening abd pain over the past week. Also having some N/V and decreased po intake. Generalized weakness. Denies sob, diarrhea, chest pain. Came to [facility] on 11/5. On arrival hemodynamically stable and sats 92% on room air. Lipase 84. CT abd/pelvis w/ contrast marked distention of the stomach to the level of the duodenum where there is circumferential thickening and heterogenous hypoattenuation at the site of the patient's previous duodenitis; new hypodensity upstream portal vein suspicious for nonocclusive thrombus and partially imaged hypodensity in the right upper lobe pulmonary artery which may represent artifact versus PE. Started on heparin. GI and surgery consulted. Patient underwent gastrojejunostomy for concern of gastric outlet obstruction secondary to PUD/duodenitis on 10/26/2023. Patient started on TPN for short period and stopped 11/13.   Overnight on 11/13 patient with increased respiratory distress and AMS. Having some abd pain. CT head 2.3 cm hypodensity involving the superior right insula/right frontal operculum, age indeterminate, but could reflect an evolving acute ischemic infarct.  CT abd/pelvis w/ contrast showing small amount of free air throughout abd; new dilated gallbladder w/ multiple gallstones concerning for cholecystits. Surgery consulted and no intervention at this time. Patient w/ increasing AMS and required  intubation. Patient also w/ hypotension requiring levophed. Plan to transfer to [facility] and PCCM consulted.  Pertinent  Medical History   Past Medical History:  Diagnosis Date   Acute blood loss anemia 01/05/2020   Acute respiratory failure with hypoxia (HCC)    Arthritis    COPD (chronic obstructive pulmonary disease) (HCC)    Depression    Diabetes mellitus    Diabetes mellitus without complication (HCC)    Diverticulitis    Elevated PSA 10/01/2016   Encounter for support and coordination of transition of care 01/14/2020   Hypercholesterolemia    Hyperlipidemia    Hypertension    Insomnia 01/05/2016   Ischemic colitis (HCC) 01/05/2020   Multiple lung nodules on CT 04/02/2015   Nicotine addiction    Obesity    Oxygen deficiency    qhs   Schizophrenia (HCC)    Significant Hospital Events: Including procedures, antibiotic start and stop dates in addition to other pertinent events   11/5 admit to [facility]  Patient underwent gastrojejunostomy for concern of gastric outlet obstruction secondary to PUD/duodenitis on 10/26/2023 11/13 change in MS. Concern for acute stroke  11/14 transferred to [facility] for respiratory failure and septic shock. Intubated on levo. Pccm consulted 11/15 On propofol, fentanyl Requiring vasopressin and norepinephrine Unresponsive, Cough, gag, corneals present 11/16 follow-up CT brain expected findings of acute stroke, no hemorrhage. 11/17: Attempting sedation further.  Heparin stopped due to severe thrombocytopenia HIT panel sent.  Added argatroban 11/18: Attempting to wean sedation and levo  Interim History / Subjective:  Became afebrile White count continued to rise Came off of vasopressor support Remain on full support mechanical ventilation NG tube is on low intermittent wall suction per general surgery recommendations  Objective   Blood pressure 97/69, pulse (!) 103, temperature 98.8 F (37.1 C), temperature source Axillary, resp. rate 20, height 6' (1.829  m), weight 80.5 kg, SpO2 98%.    Vent Mode: PRVC FiO2 (%):  [50 %-60 %] 50 % Set Rate:  [20 bmp] 20 bmp Vt Set:  [620 mL] 620 mL PEEP:  [5 cmH20] 5 cmH20 Plateau Pressure:  [16 cmH20-17 cmH20] 17 cmH20   Intake/Output Summary (Last 24 hours) at 11/07/2023 0742 Last data filed at 11/07/2023 0600 Gross per 24 hour  Intake 2614.59 ml  Output 2350 ml  Net 264.59 ml   Filed Weights   11/05/23 0530 11/06/23 0401 11/06/23 0404  Weight: 83.7 kg 85.4 kg 80.5 kg    Examination: General: Crtitically ill-appearing elderly male, orally intubated HEENT: [place]/AT, eyes anicteric.  ETT and NGT in place Neuro: Eyes closed, does not open, not following commands, unequal pupils right greater than left, reactive to light.  Positive cough and gag Chest: Coarse breath sounds, no wheezes or rhonchi Heart: Regular rate and rhythm, no murmurs or gallops Abdomen: Soft, nondistended, absent bowel sounds.  Surgical incision looks clean and dry with a staples in place Skin: No rash  Labs and images reviewed  Resolved Hospital Problem list     Assessment & Plan:  Acute encephalopathy, multifactorial due to sepsis and metabolic acidosis complicated by  Bilateral multifocal strokes with no hemorrhagic conversion  Patient remained minimally responsive though he does have positive cough and gag with pupillary response He is off sedation, required Precedex overnight Minimize sedation with RASS goal 0/-1  Acute bilateral multifocal strokes, largest in right cerebellum Appreciated stroke team following Initially showed petechial hemorrhages in the cerebellar stroke Repeat head CT showed stable Holding antiplatelet for now considering petechial hemorrhage and patient is on anticoagulation with a Keppra 1  Severe distal duodenal obstruction s/p gastrojejunostomy 11/8 Cholelithiasis General Surgery continues to follow. Abd US obtained 11/15 sm amt sludge no murphy's sign  Continue IV antibiotics Cont ppi   Keep strict NPO Continue TPN Cont LIWS (once extubated will have to have OGT placed nasal) Discussed with surgery. At this point functional recovery likely on the order of weeks to months if at all. During that time will be TPN dependent.   Septic shock due to asp PNA and probable bacterial translocation from gut w/ on-going drug induced hypotension  Shock is improved Patient came off of vasopressor support White count continued to rise, currently at 25K Will stop stress dose steroid Continue antibiotic to complete 7 days therapy with IV Zosyn  Acute hypoxemic respiratory failure due to aspiration PNA  COPD, not in exacerbation Continue lung protective ventilation VAP prevention bundle in place Patient is off sedation Placed on spontaneous breathing trial, he is tachypneic, not ready for extubation Titrate oxygen with O2 sat goal 92% Continue Brovana, continue Pulmicort and DuoNeb  Portal vein thrombosis Thrombocytopenia likely due to critical illness but there is a concern of HIT CT angiogram ruled out acute PE Serotonin assay is pending Platelet count improved to 170s Continue regular troponin for now  Anemia of critical illness No current evidence of bleeding Trigger for transfusion hgb < 7  AKI on chronic kidney disease stage IIIa Serum creatinine was improving, it trended up to 2 again Repeat serum creatinine is pending Avoid nephrotoxic agent Monitor intake and output  Type II DM w/ Steroid induced hyperglycemia  Goal 140-180 Steroids were stopped Continue Semglee 55 units and continue sliding scale insulin  Fluid  and electrolyte imbalance: hyponatremia, boarderline hypokalemia, hypophosphatemia, NAGMA Follow-up with BMP, if bicarbonate is low, will give 2 A of bicarbonate Continue aggressive electrolyte replacement  Hypertension Hyperlipidemia Hold antihypertensives at present Continue statin  Depression/anxiety Home meds on hold, takes Buspar, risperdal  and trazedone at home    Best Practice (right click and "Reselect all SmartList Selections" daily)   Diet/type: NPO-->TPN DVT prophylaxis: Argatroban GI prophylaxis: PPI Lines: N/A Foley:  N/A Code Status: DNR Last date of multidisciplinary goals of care discussion [daughter updated 11/19, please Ipal note    The patient is critically ill due to septic shock, acute respiratory failure, acute encephalopathy, acute bilateral ischemic strokes.  Critical care was necessary to treat or prevent imminent or life-threatening deterioration.  Critical care was time spent personally by me on the following activities: development of treatment plan with patient and/or surrogate as well as nursing, discussions with consultants, evaluation of patient's response to treatment, examination of patient, obtaining history from patient or surrogate, ordering and performing treatments and interventions, ordering and review of laboratory studies, ordering and review of radiographic studies, pulse oximetry, re-evaluation of patient's condition and participation in multidisciplinary rounds.   During this encounter critical care time was devoted to patient care services described in this note for 44 minutes.    Cheri Fowler, MD [facility] See Amion for pager If no response to pager, please call [phone] until 7pm After 7pm, Please call E-link [phone]

## 2023-11-07 NOTE — Progress Notes (Addendum)
PHARMACY - TOTAL PARENTERAL NUTRITION CONSULT NOTE  Indication:  Gastric outlet obstruction  Patient Measurements: Height: 6' (182.9 cm) Weight: 80.5 kg (177 lb 7.5 oz) IBW/kg (Calculated) : 77.6 TPN AdjBW (KG): 87.2 Body mass index is 24.07 kg/m.  Assessment:  44 YOM with GOO from PUD stricture/duodenitis that is preventing him from passing any food or drink, causing abdominal distention, pain, N/V.  Underwent gastrojejunostomy on 11/8 and diet gradually advanced to full liquid on 11/13.   UGI showed inability to pass through gastrojejunostomy.  He was on TPN 11/8 >> 11/13.    Patient was transferred to the ICU on 11/13 due to AMS and apnea.  CT showed AIS and free air in abdomen, dilated gallbladder, possible cholecystitis and possible hematoma/mass/infection. CCS think gallbladder is unremarkable.  Patient was transferred to [facility] on 11/14.  Pharmacy consulted to resume TPN.  Glucose / Insulin: hx DM2 - CBGs 188-229, stress dose steroids stopped on 11/20, last dose 11/19  Insulin gtt briefly 11/14  Used 36 units SSI in the past 24 hrs and Semglee 55 units BID Electrolytes: K:3.8, CO2: 15, phos: 5.9, Mag: 2.5, CoCa elevated at 12.4, others WNL Renal: SCr increased 3.11, BUN up to 162  Hepatic: LFTs uptrending AST 104/ALT 96, tbili / TG WNL, albumin 1.8  Intake / Output; MIVF: UOP 0.8 ml/kg/hr, last Lasix 11/14, NG - (bilious), LBM 11/16, LR 177mL/hr added per MD for potential pre-renal AKI  GI Imaging: 11/6 EGD: showed distal duodenal obstruction 11/11 upper GI: contrast was not moving down GJ, no passage via anastomosis  11/12 KUB: contrast within small bowel and colon 11/13 CT: free air in abdomen, dilated gallbladder, possible cholecystitis and possible hematoma/mass/infection 11/15 abd U/S: small amount of sludge within gallbladder, minimal ascites GI Surgeries / Procedures: none since TPN initiation   Central access: PICC placed 10/26/23 TPN start date: 10/26/23 >> 10/31/23,  restarting 11/15 >>  Nutritional Goals: RD Estimated Needs Total Energy Estimated Needs: 2300-2600 Total Protein Estimated Needs: 130-145 gm Total Fluid Estimated Needs: 2.3-2.6 L  Current Nutrition:  TPN  Plan:  Unable to meet needs given limitations of pre-mixed Clinimix.  Discussed with MD patient's rising BUN in the setting of exceeding AA goals for several days. Per MD will decrease AA another approx. 20% today given high rate of BUN rise (134 g protein >> 115g)  Adjust Clinimix 8/10 rate to 60 ml/hr and SMOFlipid at 20.8 ml/hr x 12 hrs to provide 1449 kCal and 115g AA, meeting 63% of kCal needs.  Add standard MVI and trace elements to TPN Continue resistant SSI Q4H and increase Semglee to 60u BID. Note that patient will likely need decrease tmrw due to further decreasing carb load (168g > 144g CHO) and also last steroid dose 11/19.  TPN labs in AM with worsening AKI and changes in protein content.   Estill Batten, PharmD, BCCCP  11/07/2023, 7:49 AM

## 2023-11-07 NOTE — Progress Notes (Signed)
PHARMACY - ANTICOAGULATION CONSULT NOTE  Pharmacy Consult for argatroban Indication:  r/o HIT , SRA pending, HIT OD potentially +   Allergies  Allergen Reactions   Asa [Aspirin] Other (See Comments)    Stomach pain   Crestor [Rosuvastatin] Other (See Comments)    Stomach pain   Heparin Other (See Comments)    Heparin antibody positive, SRA pending   Morphine Nausea And Vomiting   Wellbutrin [Bupropion] Other (See Comments)    Stomach pain   Zoloft [Sertraline Hcl] Other (See Comments)    Stomach pain    Patient Measurements: Height: 6' (182.9 cm) Weight: 80.5 kg (177 lb 7.5 oz) IBW/kg (Calculated) : 77.6  Vital Signs: Temp: 98.9 F (37.2 C) (11/20 2001) Temp Source: Oral (11/20 2001) BP: 110/66 (11/20 1700) Pulse Rate: 99 (11/20 1700)  Labs: Recent Labs    11/05/23 0407 11/05/23 1815 11/06/23 0515 11/06/23 2023 11/07/23 0625 11/07/23 1927  HGB 8.5*  --  9.6*  --  9.7*  --   HCT 27.2*  --  31.6*  --  31.4*  --   PLT 64*  --  86*  --  174  --   APTT 79*   < > 64* 81* 59* 79*  LABPROT 24.4*  --  24.6*  --  33.3*  --   INR 2.2*  --  2.2*  --  3.2*  --   CREATININE 1.74*  --  2.05*  --  3.11*  --    < > = values in this interval not displayed.    Estimated Creatinine Clearance: 23.2 mL/min (A) (by C-G formula based on SCr of 3.11 mg/dL (H)).   Assessment: 72 y.o. M on heparin for portal vein thrombosis and possible PE (seen on CT abdomen). Switched to argatroban for r/o HIT. Heparin antibody positive, SRA has been ordered.   aPTT remains therapeutic at 79 seconds on 0.5 mgc/kg/min  Goal of Therapy:  aPTT 50-90 seconds Monitor platelets by anticoagulation protocol: Yes   Plan:  Continue argatroban 0.5 mcg/kg/min Continue q12h aPTT checks and follow SRA result  Daylene Posey, PharmD, [facility] Clinical Pharmacist ED Pharmacist Phone # [phone] 11/07/2023 8:13 PM

## 2023-11-07 NOTE — Progress Notes (Addendum)
PHARMACY - ANTICOAGULATION CONSULT NOTE  Pharmacy Consult for argatroban Indication:  r/o HIT , SRA pending, HIT OD potentially +   Allergies  Allergen Reactions   Asa [Aspirin] Other (See Comments)    Stomach pain   Crestor [Rosuvastatin] Other (See Comments)    Stomach pain   Heparin Other (See Comments)    Heparin antibody positive, SRA pending   Morphine Nausea And Vomiting   Wellbutrin [Bupropion] Other (See Comments)    Stomach pain   Zoloft [Sertraline Hcl] Other (See Comments)    Stomach pain    Patient Measurements: Height: 6' (182.9 cm) Weight: 80.5 kg (177 lb 7.5 oz) IBW/kg (Calculated) : 77.6  Vital Signs: Temp: 98.8 F (37.1 C) (11/20 0734) Temp Source: Axillary (11/20 0734) BP: 97/69 (11/20 0700) Pulse Rate: 103 (11/20 0700)  Labs: Recent Labs    11/05/23 0407 11/05/23 1815 11/06/23 0515 11/06/23 2023 11/07/23 0625  HGB 8.5*  --  9.6*  --  9.7*  HCT 27.2*  --  31.6*  --  31.4*  PLT 64*  --  86*  --  174  APTT 79*   < > 64* 81* 59*  LABPROT 24.4*  --  24.6*  --  33.3*  INR 2.2*  --  2.2*  --  3.2*  CREATININE 1.74*  --  2.05*  --   --    < > = values in this interval not displayed.    Estimated Creatinine Clearance: 35.2 mL/min (A) (by C-G formula based on SCr of 2.05 mg/dL (H)).   Assessment: 72 y.o. M on heparin for portal vein thrombosis and possible PE (seen on CT abdomen). Switched to argatroban for r/o HIT. Heparin antibody positive, SRA has been ordered.   aPTT 59 sec (therapeutic) on argatroban 0.5 mcg/kg/min. HgB 9.7 and PLTs 174. No issues noted with bleeding.  CTA completed on 11/19 showing no PE. Pending SRA results and PVT w/u anticipate potentially may discontinue full anticoagulation given stroke burden.    Goal of Therapy:  aPTT 50-90 seconds Monitor platelets by anticoagulation protocol: Yes   Plan:  Continue argatroban 0.5 mcg/kg/min Q12h aPTT given critical illness F/u SRA   Estill Batten, PharmD, BCCCP  Clinical  Pharmacist 11/07/2023 7:40 AM Please check AMION for all [facility] Pharmacy numbers

## 2023-11-08 DIAGNOSIS — I81 Portal vein thrombosis: Secondary | ICD-10-CM | POA: Diagnosis not present

## 2023-11-08 DIAGNOSIS — G9341 Metabolic encephalopathy: Secondary | ICD-10-CM | POA: Diagnosis not present

## 2023-11-08 DIAGNOSIS — N1831 Chronic kidney disease, stage 3a: Secondary | ICD-10-CM | POA: Diagnosis not present

## 2023-11-08 DIAGNOSIS — J9601 Acute respiratory failure with hypoxia: Secondary | ICD-10-CM | POA: Diagnosis not present

## 2023-11-08 DIAGNOSIS — Z515 Encounter for palliative care: Secondary | ICD-10-CM

## 2023-11-08 DIAGNOSIS — Z7189 Other specified counseling: Secondary | ICD-10-CM

## 2023-11-08 DIAGNOSIS — E1165 Type 2 diabetes mellitus with hyperglycemia: Secondary | ICD-10-CM | POA: Diagnosis not present

## 2023-11-08 DIAGNOSIS — K311 Adult hypertrophic pyloric stenosis: Secondary | ICD-10-CM | POA: Diagnosis not present

## 2023-11-08 LAB — COMPREHENSIVE METABOLIC PANEL
ALT: 91 U/L — ABNORMAL HIGH (ref 0–44)
AST: 48 U/L — ABNORMAL HIGH (ref 15–41)
Albumin: 1.7 g/dL — ABNORMAL LOW (ref 3.5–5.0)
Alkaline Phosphatase: 52 U/L (ref 38–126)
Anion gap: 12 (ref 5–15)
BUN: 200 mg/dL — ABNORMAL HIGH (ref 8–23)
CO2: 14 mmol/L — ABNORMAL LOW (ref 22–32)
Calcium: 10.5 mg/dL — ABNORMAL HIGH (ref 8.9–10.3)
Chloride: 104 mmol/L (ref 98–111)
Creatinine, Ser: 4.11 mg/dL — ABNORMAL HIGH (ref 0.61–1.24)
GFR, Estimated: 15 mL/min — ABNORMAL LOW (ref >60)
Glucose, Bld: 233 mg/dL — ABNORMAL HIGH (ref 70–99)
Potassium: 3.5 mmol/L (ref 3.5–5.1)
Sodium: 130 mmol/L — ABNORMAL LOW (ref 135–145)
Total Bilirubin: 0.3 mg/dL (ref <1.2)
Total Protein: 6 g/dL — ABNORMAL LOW (ref 6.5–8.1)

## 2023-11-08 LAB — GLUCOSE, CAPILLARY
Glucose-Capillary: 207 mg/dL — ABNORMAL HIGH (ref 70–99)
Glucose-Capillary: 202 mg/dL — ABNORMAL HIGH (ref 70–99)
Glucose-Capillary: 128 mg/dL — ABNORMAL HIGH (ref 70–99)
Glucose-Capillary: 144 mg/dL — ABNORMAL HIGH (ref 70–99)
Glucose-Capillary: 62 mg/dL — ABNORMAL LOW (ref 70–99)
Glucose-Capillary: 72 mg/dL (ref 70–99)
Glucose-Capillary: 112 mg/dL — ABNORMAL HIGH (ref 70–99)
Glucose-Capillary: 13 mg/dL — CL (ref 70–99)
Glucose-Capillary: 32 mg/dL — CL (ref 70–99)

## 2023-11-08 LAB — PHOSPHORUS: Phosphorus: 6.8 mg/dL — ABNORMAL HIGH (ref 2.5–4.6)

## 2023-11-08 LAB — PROTIME-INR
INR: 3.8 — ABNORMAL HIGH (ref 0.8–1.2)
Prothrombin Time: 38 s — ABNORMAL HIGH (ref 11.4–15.2)

## 2023-11-08 LAB — MAGNESIUM: Magnesium: 2.5 mg/dL — ABNORMAL HIGH (ref 1.7–2.4)

## 2023-11-08 LAB — SEROTONIN RELEASE ASSAY (SRA)
SRA .2 IU/mL UFH Ser-aCnc: 88 % — ABNORMAL HIGH (ref 0–20)
SRA 100IU/mL UFH Ser-aCnc: <1 % (ref 0–20)

## 2023-11-08 LAB — CBC
HCT: 26.9 % — ABNORMAL LOW (ref 39.0–52.0)
Hemoglobin: 8.2 g/dL — ABNORMAL LOW (ref 13.0–17.0)
MCH: 27 pg (ref 26.0–34.0)
MCHC: 30.5 g/dL (ref 30.0–36.0)
MCV: 88.5 fL (ref 80.0–100.0)
Platelets: 225 10*3/uL (ref 150–400)
RBC: 3.04 MIL/uL — ABNORMAL LOW (ref 4.22–5.81)
RDW: 17.2 % — ABNORMAL HIGH (ref 11.5–15.5)
WBC: 22.6 10*3/uL — ABNORMAL HIGH (ref 4.0–10.5)
nRBC: 0.4 % — ABNORMAL HIGH (ref 0.0–0.2)

## 2023-11-08 LAB — TRIGLYCERIDES: Triglycerides: 221 mg/dL — ABNORMAL HIGH (ref <150)

## 2023-11-08 LAB — APTT: aPTT: 62 s — ABNORMAL HIGH (ref 24–36)

## 2023-11-08 MED ORDER — DEXTROSE 50 % IV SOLN
INTRAVENOUS | Status: AC
  Administered 2023-11-08: 50 mL via INTRAVENOUS
  Filled 2023-11-08: qty 50

## 2023-11-08 MED ORDER — DEXTROSE 50 % IV SOLN: 1.0000 | Freq: Once | INTRAVENOUS | Status: AC

## 2023-11-08 MED ORDER — DEXTROSE 10 % IV SOLN: INTRAVENOUS | Status: DC

## 2023-11-08 MED ORDER — MORPHINE 100MG IN NS 100ML (1MG/ML) PREMIX INFUSION
2.0000 mg/h | INTRAVENOUS | Status: DC
  Administered 2023-11-08: 2 mg/h via INTRAVENOUS
  Filled 2023-11-08: qty 100

## 2023-11-08 MED ORDER — FUROSEMIDE 10 MG/ML IJ SOLN
80.0000 mg | Freq: Once | INTRAMUSCULAR | Status: AC
  Administered 2023-11-08: 80 mg via INTRAVENOUS
  Filled 2023-11-08: qty 8

## 2023-11-08 MED ORDER — POTASSIUM CHLORIDE 10 MEQ/50ML IV SOLN
10.0000 meq | INTRAVENOUS | Status: AC
Start: 2023-11-08 — End: 2023-11-08
  Administered 2023-11-08 (×4): 10 meq via INTRAVENOUS
  Filled 2023-11-08 (×4): qty 50

## 2023-11-08 MED ORDER — GLYCOPYRROLATE 0.2 MG/ML IJ SOLN: 0.4000 mg | INTRAMUSCULAR | Status: DC | PRN

## 2023-11-08 NOTE — Progress Notes (Addendum)
NAME:  Matthew Molina, MRN:  [id number], DOB:  April 02, 1951, LOS: 16 ADMISSION DATE:  10/23/2023, CONSULTATION DATE:  11/14 REFERRING MD:  Dr. Arbutus Leas, CHIEF COMPLAINT:  septic shock   History of Present Illness:  Patient is a 72 yo M w/ pertinent PMH IgG MGUS, COPD, DMT2, HTN, HLD presents to [facility] on 11/5 w/ abd pain, N/V.  Patient recently admitted to hospital on 10/13 w/ N/V and abd pain. Treated for aspiration pna. GI performed EGD showing grade B esophagitis, duodenitis, and gastric ulcer nonbleeding. Discharged 10/16 w/ PPI and sucralfate.   Patient began having gradual worsening abd pain over the past week. Also having some N/V and decreased po intake. Generalized weakness. Denies sob, diarrhea, chest pain. Came to [facility] on 11/5. On arrival hemodynamically stable and sats 92% on room air. Lipase 84. CT abd/pelvis w/ contrast marked distention of the stomach to the level of the duodenum where there is circumferential thickening and heterogenous hypoattenuation at the site of the patient's previous duodenitis; new hypodensity upstream portal vein suspicious for nonocclusive thrombus and partially imaged hypodensity in the right upper lobe pulmonary artery which may represent artifact versus PE. Started on heparin. GI and surgery consulted. Patient underwent gastrojejunostomy for concern of gastric outlet obstruction secondary to PUD/duodenitis on 10/26/2023. Patient started on TPN for short period and stopped 11/13.   Overnight on 11/13 patient with increased respiratory distress and AMS. Having some abd pain. CT head 2.3 cm hypodensity involving the superior right insula/right frontal operculum, age indeterminate, but could reflect an evolving acute ischemic infarct.  CT abd/pelvis w/ contrast showing small amount of free air throughout abd; new dilated gallbladder w/ multiple gallstones concerning for cholecystits. Surgery consulted and no intervention at this time. Patient w/ increasing AMS and required  intubation. Patient also w/ hypotension requiring levophed. Plan to transfer to [facility] and PCCM consulted.  Pertinent  Medical History   Past Medical History:  Diagnosis Date   Acute blood loss anemia 01/05/2020   Acute respiratory failure with hypoxia (HCC)    Arthritis    COPD (chronic obstructive pulmonary disease) (HCC)    Depression    Diabetes mellitus    Diabetes mellitus without complication (HCC)    Diverticulitis    Elevated PSA 10/01/2016   Encounter for support and coordination of transition of care 01/14/2020   Hypercholesterolemia    Hyperlipidemia    Hypertension    Insomnia 01/05/2016   Ischemic colitis (HCC) 01/05/2020   Multiple lung nodules on CT 04/02/2015   Nicotine addiction    Obesity    Oxygen deficiency    qhs   Schizophrenia (HCC)    Significant Hospital Events: Including procedures, antibiotic start and stop dates in addition to other pertinent events   11/5 admit to [facility]  Patient underwent gastrojejunostomy for concern of gastric outlet obstruction secondary to PUD/duodenitis on 10/26/2023 11/13 change in MS. Concern for acute stroke  11/14 transferred to [facility] for respiratory failure and septic shock. Intubated on levo. Pccm consulted 11/15 On propofol, fentanyl Requiring vasopressin and norepinephrine Unresponsive, Cough, gag, corneals present 11/16 follow-up CT brain expected findings of acute stroke, no hemorrhage. 11/17: Attempting sedation further.  Heparin stopped due to severe thrombocytopenia HIT panel sent.  Added argatroban 11/18: Attempting to wean sedation and levo  Interim History / Subjective:  Patient remains minimally responsive His urine output has significantly decreased, became oliguric, serum creatinine continue to rise He is afebrile, off vasopressors   Objective   Blood pressure 98/63,  pulse 90, temperature 98 F (36.7 C), temperature source Oral, resp. rate (!) 27, height 6' (1.829 m), weight 86.2 kg, SpO2 98%.    Vent Mode:  PSV;CPAP FiO2 (%):  [40 %-50 %] 40 % Set Rate:  [15 bmp] 15 bmp Vt Set:  [420 mL] 420 mL PEEP:  [5 cmH20] 5 cmH20 Pressure Support:  [8 cmH20] 8 cmH20 Plateau Pressure:  [13 cmH20-15 cmH20] 15 cmH20   Intake/Output Summary (Last 24 hours) at 11/08/2023 0743 Last data filed at 11/08/2023 0656 Gross per 24 hour  Intake 3810.2 ml  Output 400 ml  Net 3410.2 ml   Filed Weights   11/06/23 0401 11/06/23 0404 11/08/23 0442  Weight: 85.4 kg 80.5 kg 86.2 kg    Examination: General: Crtitically ill-appearing elderly male, orally intubated HEENT: [place]/AT, eyes anicteric.  ETT and OGT in place Neuro: Eyes closed, does not open, not following commands, very weak cough and gag, pupils unequal left bigger than right minimally reactive Chest: Coarse breath sounds, no wheezes or rhonchi Heart: Regular rate and rhythm, no murmurs or gallops Abdomen: Soft, nondistended, sluggish bowel sounds.  Surgical incision looks clean and dry with staples in place Skin: No rash  Labs and images reviewed  Resolved Hospital Problem list     Assessment & Plan:  Acute encephalopathy, multifactorial due to sepsis and metabolic acidosis complicated by  Bilateral multifocal strokes with no hemorrhagic conversion  Patient is minimally responsive now with very weak cough and gag and pupillary reflex Minimal response to painful stimuli  Minimize sedation with RASS goal 0/-1, currently on Precedex 0.5  Acute bilateral multifocal strokes, largest in right cerebellum Stroke team is signed off Holding antiplatelet in the setting of petechial hemorrhage and patient is on anticoagulation with argatroban  Severe distal duodenal obstruction s/p gastrojejunostomy 11/8 Cholelithiasis General Surgery continues to follow. Abd US obtained 11/15 sm amt sludge no murphy's sign  Continue IV antibiotics Cont ppi  General Surgery recommend starting tube feeds once Corpak is placed beyond surgical site Continue TPN Discontinue  low intermittent wall suction  Septic shock due to asp PNA and probable bacterial translocation from gut w/ on-going drug induced hypotension  Shock is improved, patient remained off vasopressors for more than 24 hours White count trended down to 22 Stress dose steroids were stopped Continue antibiotic  Acute hypoxemic respiratory failure due to aspiration PNA  COPD, not in exacerbation Continue lung protective ventilation VAP prevention bundle in place Patient is tolerating spontaneous breathing trial but mental status precludes extubation Titrate oxygen with O2 sat goal 92% Continue Brovana, continue Pulmicort and DuoNeb  Portal vein thrombosis was ruled out Acute PE was ruled out Thrombocytopenia likely due to critical illness but there is a concern of HIT CT angiogram ruled out acute PE Antiplatelet antibodies were positive Serotonin assay is pending Platelet count improved to normal, currently at 225 Continue argatroban for now until serotonin assay is negative  Anemia of critical illness No current evidence of bleeding Trigger for transfusion hgb < 7  AKI on chronic kidney disease stage IIIa Patient serum creatinine continue to rise, currently at 4 with a BUN at 200 He is oligoanuric, total urine output was 200 cc in last 24 hours Avoid nephrotoxic agent Monitor intake and output  Type II DM w/ Steroid induced hyperglycemia  Blood sugars are not well-controlled ranging in 200-240  Continue Semglee 55 units and continue sliding scale insulin with CBG goal 140-180  Fluid and electrolyte imbalance: hyponatremia, boarderline hypokalemia, hypophosphatemia, NAGMA  Follow-up with BMP, serum bicarbonate remain low at 14 likely due to worsening renal function Continue aggressive electrolyte replacement Serum sodium has trended down She looks volume overloaded, will give 80 mg of Lasix Stop IV fluid  Hypertension Hyperlipidemia Hold antihypertensives at present Continue  statin  Depression/anxiety Home meds on hold, takes Buspar, risperdal and trazedone at home    Best Practice (right click and "Reselect all SmartList Selections" daily)   Diet/type: NPO-->TPN DVT prophylaxis: Argatroban GI prophylaxis: PPI Lines: N/A Foley:  N/A Code Status: DNR Last date of multidisciplinary goals of care discussion: 11/21: Spoke with patient's daughter, she would like to speak with her siblings before making decision for his care   The patient is critically ill due to Acute respiratory failure, acute encephalopathy, acute bilateral ischemic strokes.  Critical care was necessary to treat or prevent imminent or life-threatening deterioration.  Critical care was time spent personally by me on the following activities: development of treatment plan with patient and/or surrogate as well as nursing, discussions with consultants, evaluation of patient's response to treatment, examination of patient, obtaining history from patient or surrogate, ordering and performing treatments and interventions, ordering and review of laboratory studies, ordering and review of radiographic studies, pulse oximetry, re-evaluation of patient's condition and participation in multidisciplinary rounds.   During this encounter critical care time was devoted to patient care services described in this note for 35 minutes.    Cheri Fowler, MD [facility] See Amion for pager If no response to pager, please call [phone] until 7pm After 7pm, Please call E-link [phone]

## 2023-11-08 NOTE — Progress Notes (Signed)
eLink Physician-Brief Progress Note Patient Name: BJORN TETLOW DOB: 1951-05-09 MRN: [id number]   Date of Service  11/08/2023  HPI/Events of Note  K 3.5, cr 2  eICU Interventions  Kcl ordered     Intervention Category Minor Interventions: Electrolytes abnormality - evaluation and management  Marvon Shillingburg 11/08/2023, 6:04 AM

## 2023-11-08 NOTE — Progress Notes (Signed)
13 Days Post-Op   Subjective/Chief Complaint: Intubated sedated, did have a bm, no ng output recorded   Objective: Vital signs in last 24 hours: Temp:  [97.3 F (36.3 C)-98.9 F (37.2 C)] 98.7 F (37.1 C) (11/21 0345) Pulse Rate:  [88-106] 90 (11/21 0645) Resp:  [20-40] 27 (11/21 0714) BP: (59-114)/(43-76) 98/63 (11/21 0645) SpO2:  [96 %-99 %] 98 % (11/21 0645) FiO2 (%):  [40 %-50 %] 40 % (11/21 0714) Weight:  [86.2 kg] 86.2 kg (11/21 0442) Last BM Date : 11/03/23  Intake/Output from previous day: 11/20 0701 - 11/21 0700 In: 3810.2 [I.V.:3707.8; IV Piggyback:102.4] Out: 400 [Urine:400] Intake/Output this shift: No intake/output data recorded.  Ab soft nondistended incision without infection staples out  Lab Results:  Recent Labs    11/07/23 0625 11/08/23 0434  WBC 25.0* 22.6*  HGB 9.7* 8.2*  HCT 31.4* 26.9*  PLT 174 225   BMET Recent Labs    11/07/23 0625 11/08/23 0434  NA 135 130*  K 3.8 3.5  CL 107 104  CO2 15* 14*  GLUCOSE 224* 233*  BUN 162* 200*  CREATININE 3.11* 4.11*  CALCIUM 10.6* 10.5*   PT/INR Recent Labs    11/07/23 0625 11/08/23 0434  LABPROT 33.3* 38.0*  INR 3.2* 3.8*   ABG Recent Labs    11/06/23 0515  HCO3 16.1*    Studies/Results: CT Angio Chest Pulmonary Embolism (PE) W or WO Contrast  Result Date: 11/06/2023 CLINICAL DATA:  Evaluate for pulmonary embolism. Incomplete evaluation on prior aortic study. EXAM: CT ANGIOGRAPHY CHEST WITH CONTRAST TECHNIQUE: Multidetector CT imaging of the chest was performed using the standard protocol during bolus administration of intravenous contrast. Multiplanar CT image reconstructions and MIPs were obtained to evaluate the vascular anatomy. RADIATION DOSE REDUCTION: This exam was performed according to the departmental dose-optimization program which includes automated exposure control, adjustment of the mA and/or kV according to patient size and/or use of iterative reconstruction technique.  CONTRAST:  75mL OMNIPAQUE IOHEXOL 350 MG/ML SOLN COMPARISON:  10/24/2023, 10/31/2023 FINDINGS: Cardiovascular: Atherosclerotic calcifications of the thoracic aorta are noted. Stable dilatation of 4 cm is noted in the ascending aorta. Normal tapering is noted in the aortic arch. Descending thoracic aorta is within normal limits. No significant cardiac enlargement is noted. Heavy coronary calcifications are seen. The pulmonary artery shows a normal branching pattern bilaterally. No filling defect to suggest pulmonary embolism is noted. Mediastinum/Nodes: Thoracic inlet again demonstrates scattered small thyroid hypodensities. No follow-up is recommended. Endotracheal tube and gastric catheter are noted in satisfactory position. No hilar or mediastinal adenopathy is noted. Lungs/Pleura: Bibasilar atelectatic changes are seen. No sizable effusion is noted. No parenchymal nodules are seen. No pleural effusion is noted. Upper Abdomen: Visualized upper abdomen demonstrates multiple renal cysts similar to that seen on the prior exam. No follow-up is recommended. Mild fluid is noted surrounding the liver new from the prior exam. The gallbladder is within normal limits. Wall thickening in the duodenum is seen this is incompletely evaluated on this exam and stable from previous studies. Musculoskeletal: No acute rib abnormality is noted. Degenerative changes of the thoracic spine are seen. Review of the MIP images confirms the above findings. IMPRESSION: No evidence of pulmonary emboli. Bibasilar atelectatic changes. Subcentimeter incidental thyroid nodules. No follow-up imaging is recommended. Reference: J Am Coll Radiol. 2015 Feb;12(2): 143-50 New free fluid within the abdomen. Persistent duodenal thickening is noted in the second portion of the duodenum. Electronically Signed   By: Eulah Pont.D.  On: 11/06/2023 21:56    Anti-infectives: Anti-infectives (From admission, onward)    Start     Dose/Rate Route  Frequency Ordered Stop   11/02/23 1500  vancomycin (VANCOREADY) IVPB 1250 mg/250 mL  Status:  Discontinued        1,250 mg 166.7 mL/hr over 90 Minutes Intravenous Every 24 hours 11/01/23 1508 11/02/23 1339   11/02/23 1339  vancomycin variable dose per unstable renal function (pharmacist dosing)  Status:  Discontinued         Does not apply See admin instructions 11/02/23 1339 11/03/23 1501   11/01/23 1500  vancomycin (VANCOREADY) IVPB 1750 mg/350 mL        1,750 mg 175 mL/hr over 120 Minutes Intravenous  Once 11/01/23 1424 11/01/23 1656   10/31/23 2200  piperacillin-tazobactam (ZOSYN) IVPB 3.375 g        3.375 g 12.5 mL/hr over 240 Minutes Intravenous Every 8 hours 10/31/23 1753 11/07/23 1928   10/26/23 1400  ceFAZolin (ANCEF) IVPB 2g/100 mL premix        2 g 200 mL/hr over 30 Minutes Intravenous On call to O.R. 10/26/23 1341 10/26/23 1558   10/26/23 1343  ceFAZolin (ANCEF) 2-4 GM/100ML-% IVPB       Note to Pharmacy: Sherren Kerns H: cabinet override      10/26/23 1343 10/26/23 1559       Assessment/Plan: POD 13 s/p Gastrojejunostomy 10/26/23 by Dr. Robyne Peers for GOO secondary to PUD stricture/duodenitis and portal vein thrombosis - CT a/p 11/13 shows small amount of free air but no free fluid to indicate a leak or other intraabdominal issue; radiology reports heterogeneous masslike density in the region of the first and second portion of the duodenum with some new central fluid areas of attenuation that may represent hematoma, mass and/or infection. -Cont NPO and nothing down tube until we can see persistent evidence of gastric emptying as stomach is likely not emptying through GJ well yet. UGI 11/11 showed no passage through anastomosis this is likely functional but may not be open either.  -once extubated would need OG converted to NGT prior to that.  -if he is unable to eat/be fed enterally at some point a conversation about a g or more appropriately a gj tube would be in order but not  right now. He could be fed enterally now if fluoro could place a cortrak past the anastomosis -I think his likelihood of returning to quality of life pre this incident is very low Stroke: per neurology, prognosis unclear now   ID - zosyn VTE - heparin gtt on hold due to severe thrombocytopenia, now on argatrogan FEN - IVF, NPO/OGT to LIWS. TPN Foley - none   AMS/acute stroke Shock VDRF DM HTN HLD CAD COPD IgG MGUS   Matthew Molina 11/08/2023

## 2023-11-08 NOTE — Consult Note (Signed)
Consultation Note Date: 11/08/2023   Patient Name: Matthew Molina  DOB: [birth year]  MRN: [id number]  Age / Sex: 72 y.o., male  PCP: Kerri Perches, MD Referring Physician: Cheri Fowler, MD  Reason for Consultation: Establishing goals of care  HPI/Patient Profile: 72 y.o. male  with past medical history of IgG MGUS, COPD, DMII, HTN and HLD admitted on 10/23/2023 with abdominal pain, N/V.   Patient presented to [facility] on [facility]/5 after recent hospitalization and was found to have findings concerning for gastric outlet obstruction secondary to peptic ulcer stricture/duodenitis, now s/p gastrojejunostomy on 11/8. He also had findings of portal vein thrombosis on anticoagulation, unfortunately decompensated with intubation and placed on vasopressors for shock. He has multiorgan system failure and poor prognosis.  PMT has been consulted to assist with goals of care conversation.  Clinical Assessment and Goals of Care:  I have reviewed medical records including EPIC notes, labs and imaging, assessed the patient and then had a phone conversation with patient's daughter Eligah East to discuss diagnosis prognosis, GOC, EOL wishes, disposition and options.  I introduced Palliative Medicine as specialized medical care for people living with serious illness. It focuses on providing relief from the symptoms and stress of a serious illness. The goal is to improve quality of life for both the patient and the family.  We discussed a brief life review of the patient and then focused on their current illness. I attempted to elicit values and goals of care important to the patient.    Discussion: Reached out to patient's daughter Eligah East after receiving consult and provided palliative support after her GOC discussion with primary team this morning. She confirms that the family is now all in agreement and "have each other's backs." We  discussed the difficulty of making complex medical decisions and tentative plan for palliative extubation tomorrow around noon, when family all plans to come together and support the patient. She is coming from [place] just outside of [place] while most of patient's family lives close by in [place]. She has no other questions or concerns at this time. PMT contact information was provided. The family was encouraged to call with questions or concerns.  PMT will continue to support holistically.   SUMMARY OF RECOMMENDATIONS   -Continue DNR -Focus is now on patient's comfort, plan is for compassionate extubation tomorrow 11/22 around 12pm when family is all present -Psychosocial and emotional support provided -PMT will continue to follow and support  Prognosis:  Hours - Days  Discharge Planning: Anticipated Hospital Death      Primary Diagnoses: Present on Admission:  Portal vein thrombosis  Tobacco abuse  Schizophrenia, unspecified type (HCC)  Nausea and vomiting  Emphysema lung (HCC)  Chronic kidney disease, stage 3a (HCC)  Gastric outlet obstruction  Protein-calorie malnutrition, severe  Acute respiratory failure with hypoxia (HCC)   Physical Exam Vitals and nursing note reviewed.  Constitutional:      General: He is not in acute distress.    Appearance: He is ill-appearing.     Interventions: He is intubated.  Cardiovascular:     Rate and Rhythm: Normal rate.  Pulmonary:     Effort: He is intubated.  Neurological:     Mental Status: He is unresponsive.    Vital Signs: BP (!) 87/55   Pulse 88   Temp 98 F (36.7 C) (Oral)   Resp (!) 27   Ht 6' (1.829 m)   Wt 86.2 kg   SpO2 98%   BMI 25.77 kg/m  Pain Scale: CPOT POSS *See Group Information*: 1-Acceptable,Awake and alert Pain Score: 0-No pain   SpO2: SpO2: 98 % O2 Device:SpO2: 98 % O2 Flow Rate: .O2 Flow Rate (L/min): 4 L/min    Beryle Bagsby Jeni Salles, PA-C  Palliative Medicine Team Team phone #  [phone]  Thank you for allowing the Palliative Medicine Team to assist in the care of this patient. Please utilize secure chat with additional questions, if there is no response within 30 minutes please call the above phone number.  Palliative Medicine Team providers are available by phone from 7am to 7pm daily and can be reached through the team cell phone.  Should this patient require assistance outside of these hours, please call the patient's attending physician.

## 2023-11-08 NOTE — Progress Notes (Signed)
eLink Physician-Brief Progress Note Patient Name: Matthew Molina DOB: 14-Aug-1951 MRN: [id number]   Date of Service  11/08/2023  HPI/Events of Note  72 y.o. male  with past medical history of IgG MGUS, COPD, DMII, HTN and HLD admitted on 10/23/2023 with abdominal pain, N/V.  Intubated with multifocal strokes and ongoing encephalopathy.  Anticipated palliative extubation tomorrow.  TPN was stopped and blood glucoses have been low tonight 13-32.  eICU Interventions  Initiate dextrose infusion.  Hypoglycemia protocol.     Intervention Category Intermediate Interventions: Hyperglycemia - evaluation and treatment  [name] 11/08/2023, 11:58 PM

## 2023-11-08 NOTE — Progress Notes (Addendum)
PHARMACY - ANTICOAGULATION CONSULT NOTE  Pharmacy Consult for argatroban Indication:  r/o HIT , SRA pending, HIT OD potentially +   Allergies  Allergen Reactions   Asa [Aspirin] Other (See Comments)    Stomach pain   Crestor [Rosuvastatin] Other (See Comments)    Stomach pain   Heparin Other (See Comments)    Heparin antibody positive, SRA pending   Morphine Nausea And Vomiting   Wellbutrin [Bupropion] Other (See Comments)    Stomach pain   Zoloft [Sertraline Hcl] Other (See Comments)    Stomach pain    Patient Measurements: Height: 6' (182.9 cm) Weight: 86.2 kg (190 lb 0.6 oz) IBW/kg (Calculated) : 77.6  Vital Signs: Temp: 98.7 F (37.1 C) (11/21 0345) Temp Source: Oral (11/21 0345) BP: 98/63 (11/21 0645) Pulse Rate: 90 (11/21 0645)  Labs: Recent Labs    11/06/23 0515 11/06/23 2023 11/07/23 0625 11/07/23 1927 11/08/23 0434  HGB 9.6*  --  9.7*  --  8.2*  HCT 31.6*  --  31.4*  --  26.9*  PLT 86*  --  174  --  225  APTT 64*   < > 59* 79* 62*  LABPROT 24.6*  --  33.3*  --  38.0*  INR 2.2*  --  3.2*  --  3.8*  CREATININE 2.05*  --  3.11*  --  4.11*   < > = values in this interval not displayed.    Estimated Creatinine Clearance: 17.6 mL/min (A) (by C-G formula based on SCr of 4.11 mg/dL (H)).   Assessment: 72 y.o. M on heparin for portal vein thrombosis and possible PE (seen on CT abdomen). CTA negative for PE.  Switched to argatroban for r/o HIT. Heparin antibody positive, SRA has been ordered.   aPTT 62 sec (therapeutic) on argatroban 0.5 mcg/kg/min. CBC stable No issues noted with bleeding.  INR elevated likely d/t argatroban.  Goal of Therapy:  aPTT 50-90 seconds Monitor platelets by anticoagulation protocol: Yes   Plan:  Continue argatroban 0.5 mcg/kg/min Q12H aPTT given critical illness F/u SRA  F/u [facility] plans  Bethany Cumming D. Laney Potash, PharmD, BCPS, BCCCP 11/08/2023, 7:02 AM

## 2023-11-08 NOTE — Progress Notes (Signed)
PHARMACY - TOTAL PARENTERAL NUTRITION CONSULT NOTE  Indication:  Gastric outlet obstruction  Patient Measurements: Height: 6' (182.9 cm) Weight: 86.2 kg (190 lb 0.6 oz) IBW/kg (Calculated) : 77.6 TPN AdjBW (KG): 87.2 Body mass index is 25.77 kg/m.  Assessment:  56 YOM with GOO from PUD stricture/duodenitis that is preventing him from passing any food or drink, causing abdominal distention, pain, N/V.  Underwent gastrojejunostomy on 11/8 and diet gradually advanced to full liquid on 11/13.  UGI showed inability to pass through gastrojejunostomy.  He was on TPN 11/8 >> 11/13.    Patient was transferred to the ICU on 11/13 due to AMS and apnea.  CT showed AIS and free air in abdomen, dilated gallbladder, possible cholecystitis and possible hematoma/mass/infection. CCS think gallbladder is unremarkable.  Patient was transferred to [facility] on 11/14.  Pharmacy consulted to resume TPN.  Glucose / Insulin: hx DM2 - CBGs elevated Insulin gtt briefly 11/14  Stress dose steroids stopped on 11/19 Used 36 units SSI in the past 24 hrs and Semglee 60 units BID Electrolytes: low Na/CO2, Phos / Mag / Ca (corrected to 12.3) are elevated, others WNL Renal: AKI - SCr up to 4.11, BUN up to 200 Hepatic: AST/ALT 48/91, tbili WNL, albumin 1.7, TG elevated at 221 Intake / Output; MIVF: UOP 0.2 ml/kg/hr, NG < (bilious), LBM 11/20 GI Imaging: 11/6 EGD: showed distal duodenal obstruction 11/11 upper GI: contrast was not moving down GJ, no passage via anastomosis  11/12 KUB: contrast within small bowel and colon 11/13 CT: free air in abdomen, dilated gallbladder, possible cholecystitis and possible hematoma/mass/infection 11/15 abd U/S: small amount of sludge within gallbladder, minimal ascites GI Surgeries / Procedures: none since TPN initiation   Central access: PICC placed 10/26/23 TPN start date: 10/26/23 >> 10/31/23, restarting 11/15 >>  Nutritional Goals: RD Estimated Needs Total Energy Estimated  Needs: 2300-2600 Total Protein Estimated Needs: 130-145 gm Total Fluid Estimated Needs: 2.3-2.6 L  Current Nutrition:  TPN  Plan:  Planning comfort care 11/22 D/C TPN - reduce to 30 ml/hr, then stop at 1800 Give AM Semglee 60 units, then stop D/C TPN labs and nursing care orders  Jeydi Klingel D. Laney Potash, PharmD, BCPS, BCCCP 11/08/2023, 8:32 AM

## 2023-11-08 NOTE — Progress Notes (Signed)
Nutrition Brief Note  Chart reviewed. Pt transitioning to comfort care with plans for palliative extubation tomorrow. No further nutrition interventions planned at this time.  Please re-consult as needed.    Mertie Clause, MS, RD, LDN Registered Dietitian II Please see AMiON for contact information.

## 2023-11-08 NOTE — IPAL (Signed)
  Interdisciplinary Goals of Care Family Meeting   Date carried out: 11/08/2023  Location of the meeting: Phone conference  Member's involved: Physician and Bedside Registered Nurse  Durable Power of Attorney or acting medical decision maker: Matthew Molina     Discussion: We discussed goals of care for Matthew Molina .  Patient's condition was updated to patient's daughter, I explained that unfortunately he is going into multisystem organ failure including respiratory failure, kidney failure, liver injury, bilateral multifocal strokes and he is minimally responsive.  She spoke with her siblings and all were in agreement to keep him comfortable and let him pass peacefully.  Will start morphine infusion, patient's family would like to come in person tomorrow and then we will proceed with palliative extubation.  Code status:   Code Status: Do not attempt resuscitation (DNR) PRE-ARREST INTERVENTIONS DESIRED   Disposition: In-patient comfort care     Cheri Fowler, MD [facility] See Amion for pager If no response to pager, please call [phone] until 7pm After 7pm, Please call E-link [phone]

## 2023-11-09 DIAGNOSIS — Z7189 Other specified counseling: Secondary | ICD-10-CM | POA: Diagnosis not present

## 2023-11-09 DIAGNOSIS — I81 Portal vein thrombosis: Secondary | ICD-10-CM | POA: Diagnosis not present

## 2023-11-09 DIAGNOSIS — Z515 Encounter for palliative care: Secondary | ICD-10-CM | POA: Diagnosis not present

## 2023-11-09 DIAGNOSIS — K311 Adult hypertrophic pyloric stenosis: Secondary | ICD-10-CM | POA: Diagnosis not present

## 2023-11-09 LAB — GLUCOSE, CAPILLARY
Glucose-Capillary: 100 mg/dL — ABNORMAL HIGH (ref 70–99)
Glucose-Capillary: 52 mg/dL — ABNORMAL LOW (ref 70–99)
Glucose-Capillary: 54 mg/dL — ABNORMAL LOW (ref 70–99)
Glucose-Capillary: 58 mg/dL — ABNORMAL LOW (ref 70–99)
Glucose-Capillary: 78 mg/dL (ref 70–99)

## 2023-11-09 MED ORDER — MORPHINE BOLUS VIA INFUSION: 2.0000 mg | INTRAVENOUS | Status: DC | PRN

## 2023-11-09 MED ORDER — DEXTROSE 50 % IV SOLN
12.5000 g | Freq: Once | INTRAVENOUS | Status: AC
Start: 2023-11-09 — End: 2023-11-09
  Administered 2023-11-09: 12.5 g via INTRAVENOUS
  Filled 2023-11-09: qty 50

## 2023-11-09 MED ORDER — DEXTROSE 50 % IV SOLN
INTRAVENOUS | Status: AC
  Administered 2023-11-09: 50 mL via INTRAVENOUS
  Filled 2023-11-09: qty 50

## 2023-11-09 MED ORDER — POLYVINYL ALCOHOL 1.4 % OP SOLN: 1.0000 [drp] | Freq: Four times a day (QID) | OPHTHALMIC | Status: DC | PRN

## 2023-11-12 ENCOUNTER — Other Ambulatory Visit: Payer: Self-pay | Admitting: Family Medicine

## 2023-11-12 DIAGNOSIS — J449 Chronic obstructive pulmonary disease, unspecified: Secondary | ICD-10-CM

## 2023-11-12 LAB — PTH-RELATED PEPTIDE: PTH-related peptide: <2 pmol/L

## 2023-11-14 ENCOUNTER — Other Ambulatory Visit: Payer: 59 | Admitting: Urology

## 2023-11-14 ENCOUNTER — Other Ambulatory Visit (HOSPITAL_COMMUNITY): Payer: 59

## 2023-11-18 NOTE — Death Summary Note (Signed)
DEATH SUMMARY   Patient Details  Name: Matthew Molina MRN: [id number] DOB: [birth year]  Admission/Discharge Information   Admit Date:  2023-10-25  Date of Death: Date of Death: 11/11/23  Time of Death: Time of Death: 1310  Length of Stay: 2023/12/06  Referring Physician: Kerri Perches, MD   Reason(s) for Hospitalization  Acute encephalopathy, multifactorial due to sepsis and metabolic acidosis complicated by Bilateral acute multifocal ischemic strokes with no hemorrhagic conversion  Acute bilateral multifocal strokes, largest in right cerebellum Severe distal duodenal obstruction s/p gastrojejunostomy 11/8 Cholelithiasis Septic shock due to asp PNA and probable bacterial translocation from gut w/ on-going drug induced hypotension  Acute hypoxemic respiratory failure due to aspiration PNA  COPD, not in exacerbation Portal vein thrombosis was ruled out Acute PE was ruled out Thrombocytopenia HIT Anemia of critical illness AKI on chronic kidney disease stage IIIa Type II DM w/ Steroid induced hyperglycemia   Fluid and electrolyte imbalance: hyponatremia, boarderline hypokalemia, hypophosphatemia, NAGMA Hypertension Hyperlipidemia Depression/anxiety  Diagnoses  Preliminary cause of death: Withdrawal of care per patient's family request in the setting of multisystem organ failure Secondary Diagnoses (including complications and co-morbidities):  Principal Problem:   Portal vein thrombosis Active Problems:   Schizophrenia, unspecified type (HCC)   Acute respiratory failure with hypoxia (HCC)   Tobacco abuse   Emphysema lung (HCC)   Nausea and vomiting   Chronic kidney disease, stage 3a (HCC)   Gastric outlet obstruction   Small bowel obstruction (HCC)   Protein-calorie malnutrition, severe   On total parenteral nutrition (TPN)   Aspiration pneumonitis (HCC)   Acute metabolic encephalopathy   Septic shock (HCC)   Encephalopathy acute   Brief Hospital Course (including  significant findings, care, treatment, and services provided and events leading to death)  Matthew Molina is a 72 y.o. year old male w/ pertinent PMH IgG MGUS, COPD, DMT2, HTN, HLD presents to [facility] on 10-25-2023 w/ abd pain, N/V.   Patient recently admitted to hospital on 10/13 w/ N/V and abd pain. Treated for aspiration pna. GI performed EGD showing grade B esophagitis, duodenitis, and gastric ulcer nonbleeding. Discharged 10/16 w/ PPI and sucralfate.    Patient began having gradual worsening abd pain over the past week. Also having some N/V and decreased po intake. Generalized weakness. Denies sob, diarrhea, chest pain. Came to [facility] on 2023-10-25. On arrival hemodynamically stable and sats 92% on room air. Lipase 84. CT abd/pelvis w/ contrast marked distention of the stomach to the level of the duodenum where there is circumferential thickening and heterogenous hypoattenuation at the site of the patient's previous duodenitis; new hypodensity upstream portal vein suspicious for nonocclusive thrombus and partially imaged hypodensity in the right upper lobe pulmonary artery which may represent artifact versus PE. Started on heparin. GI and surgery consulted. Patient underwent gastrojejunostomy for concern of gastric outlet obstruction secondary to PUD/duodenitis on 11/11/2023. Patient started on TPN for short period and stopped 11/13.    Overnight on 11/13 patient with increased respiratory distress and AMS. Having some abd pain. CT head 2.3 cm hypodensity involving the superior right insula/right frontal operculum, age indeterminate, but could reflect an evolving acute ischemic infarct.  CT abd/pelvis w/ contrast showing small amount of free air throughout abd; new dilated gallbladder w/ multiple gallstones concerning for cholecystits. Surgery consulted and no intervention at this time. Patient w/ increasing AMS and required intubation. Patient also w/ hypotension requiring levophed. Plan to transfer to [facility] and PCCM  consulted.  General surgery was following,  patient was placed on low intermittent wall suction, ultrasound of the abdomen was done which showed sludge in the gallbladder but no Murphy sign.  He continued on IV antibiotics.  General surgery recommend to keep him n.p.o., he was started on TPN.,  He started getting encephalopathic, did not respond, initial head CT was negative, MRI brain was done which showed bilateral multifocal acute ischemic stroke with petechial hemorrhages in right cerebellar stroke, stroke team was consulted, due to petechial hemorrhages he was not started on antiplatelet.  Patient was noted to be thrombocytopenic, HIT antibodies were sent which came back positive, it was confirmed with serotonin assay which came back positive, patient was on argatroban.  He developed aspiration pneumonia, was started on IV antibiotics, required vasopressor support, he remained on mechanical ventilator, did not tolerate spontaneous breathing trial.  Course was complicated with worsening acute kidney injury on CKD stage III with severe metabolic acidosis and electrolyte abnormalities.  Due to worsening multisystem organ failure, goals of care discussions were carried with family, patient's family decided to proceed with comfort care and palliative extubation.  Patient was palliatively extubated on December 09, 2023 per comfort care protocol.  He was declared dead on 12-09-2023 at 1:10 PM.  Patient's family was at bedside  Pertinent Labs and Studies  Significant Diagnostic Studies CT Angio Chest Pulmonary Embolism (PE) W or WO Contrast  Result Date: 11/06/2023 CLINICAL DATA:  Evaluate for pulmonary embolism. Incomplete evaluation on prior aortic study. EXAM: CT ANGIOGRAPHY CHEST WITH CONTRAST TECHNIQUE: Multidetector CT imaging of the chest was performed using the standard protocol during bolus administration of intravenous contrast. Multiplanar CT image reconstructions and MIPs were obtained to evaluate the  vascular anatomy. RADIATION DOSE REDUCTION: This exam was performed according to the departmental dose-optimization program which includes automated exposure control, adjustment of the mA and/or kV according to patient size and/or use of iterative reconstruction technique. CONTRAST:  75mL OMNIPAQUE IOHEXOL 350 MG/ML SOLN COMPARISON:  10/27/2023, 10/31/2023 FINDINGS: Cardiovascular: Atherosclerotic calcifications of the thoracic aorta are noted. Stable dilatation of 4 cm is noted in the ascending aorta. Normal tapering is noted in the aortic arch. Descending thoracic aorta is within normal limits. No significant cardiac enlargement is noted. Heavy coronary calcifications are seen. The pulmonary artery shows a normal branching pattern bilaterally. No filling defect to suggest pulmonary embolism is noted. Mediastinum/Nodes: Thoracic inlet again demonstrates scattered small thyroid hypodensities. No follow-up is recommended. Endotracheal tube and gastric catheter are noted in satisfactory position. No hilar or mediastinal adenopathy is noted. Lungs/Pleura: Bibasilar atelectatic changes are seen. No sizable effusion is noted. No parenchymal nodules are seen. No pleural effusion is noted. Upper Abdomen: Visualized upper abdomen demonstrates multiple renal cysts similar to that seen on the prior exam. No follow-up is recommended. Mild fluid is noted surrounding the liver new from the prior exam. The gallbladder is within normal limits. Wall thickening in the duodenum is seen this is incompletely evaluated on this exam and stable from previous studies. Musculoskeletal: No acute rib abnormality is noted. Degenerative changes of the thoracic spine are seen. Review of the MIP images confirms the above findings. IMPRESSION: No evidence of pulmonary emboli. Bibasilar atelectatic changes. Subcentimeter incidental thyroid nodules. No follow-up imaging is recommended. Reference: J Am Coll Radiol. 2015 Feb;12(2): 143-50 New free  fluid within the abdomen. Persistent duodenal thickening is noted in the second portion of the duodenum. Electronically Signed   By: Alcide Clever M.D.   On: 11/06/2023 21:56   DG Abd 1 View  Result  Date: 11/06/2023 CLINICAL DATA:  Small bowel obstruction. EXAM: ABDOMEN - 1 VIEW COMPARISON:  November 02, 2023. FINDINGS: Distal tip of nasogastric tube is seen in expected position of distal stomach. No abnormal bowel dilatation is noted. Contrast is noted in nondilated colon. IMPRESSION: No abnormal bowel dilatation. Electronically Signed   By: Lupita Raider M.D.   On: 11/06/2023 07:57   CT HEAD WO CONTRAST ( )  Result Date: 11/04/2023 CLINICAL DATA:  Stroke follow-up EXAM: CT HEAD WITHOUT CONTRAST TECHNIQUE: Contiguous axial images were obtained from the base of the skull through the vertex without intravenous contrast. RADIATION DOSE REDUCTION: This exam was performed according to the departmental dose-optimization program which includes automated exposure control, adjustment of the mA and/or kV according to patient size and/or use of iterative reconstruction technique. COMPARISON:  Brain MRI from 2 days ago FINDINGS: Brain: Multifocal acute infarction in the right cerebellum, most confluent in the right superior cerebellum. Small scattered cortical infarcts along the bilateral cerebral convexity, most confluent at the right frontal operculum. No gross progression or hemorrhage. No hydrocephalus or shift. Vascular: No hyperdense vessel or unexpected calcification. Skull: No acute finding Sinuses/Orbits: Motion artifact. IMPRESSION: Multiple acute infarcts. No hemorrhagic conversion or gross progression. The largest is in the upper right cerebellum, no hydrocephalus. Motion artifact. Electronically Signed   By: Tiburcio Pea M.D.   On: 11/04/2023 06:04   MR BRAIN WO CONTRAST  Result Date: 11/02/2023 CLINICAL DATA:  Stroke suspected EXAM: MRI HEAD WITHOUT CONTRAST TECHNIQUE: Multiplanar, multiecho  pulse sequences of the brain and surrounding structures were obtained without intravenous contrast. COMPARISON:  No prior MRI available, correlation is made with CT head 10/31/2023 FINDINGS: Brain: Large area of restricted diffusion with ADC correlate in the superior right cerebellum (series 5, image 64 and series 7, image 50), with smaller areas of restricted diffusion in the left cerebellum (series 7, images 45-47). These are associated with edema, causing mild mass effect on the posterior right aspect of the pons. The fourth ventricle remains patent. Cortical restricted diffusion with ADC correlate most prominently in right frontal operculum (series 5, image 78), and more superior right frontal and parietal lobes (series 5, image 89), with the right frontal operculum correlating with the hypodensity noted on 10/31/2023. Additional scattered, smaller areas of restricted diffusion in the bilateral frontal, parietal, and occipital cortex and white matter (series 5, images 70, 74, 78, 82, 85, and 91, for example). These are associated with increased T2 hyperintense signal. The right cerebellar infarct is associated with hemosiderin deposition (series 12, image 20), likely petechial hemorrhage. No other acute hemorrhage, mass, mass effect, or midline shift. No hydrocephalus or extra-axial collection. Scattered punctate foci of hemosiderin deposition in the bilateral cerebral hemispheres, most prominently the bilateral occipital and parietal lobes, with additional sulcal hemosiderin deposition in the medial left frontal lobe and left parietal lobe (series 12, image 14). Confluent T2 hyperintense signal in the periventricular white matter, likely the sequela of severe chronic small vessel ischemic disease. Vascular: Normal arterial flow voids. Skull and upper cervical spine: Normal marrow signal. Sinuses/Orbits: Clear paranasal sinuses. Status post bilateral lens replacements. Other: The mastoids are well aerated.  IMPRESSION: 1. Large acute infarct in the superior right cerebellum, with smaller acute infarcts in the left cerebellum. The right cerebellar infarct is associated with petechial hemorrhage. 2. Cortical acute infarct in the right frontal operculum correlates with the hypodensity suspected on the 10/31/2023 CT. Additional primarily cortical acute infarcts in the bilateral frontal, parietal, and occipital lobes. Given  multiple vascular territories, a central embolic etiology is suspected. 3. Scattered punctate foci of hemosiderin deposition in the bilateral cerebral hemispheres, which are nonspecific but concerning for cerebral amyloid angiopathy. These results will be called to the ordering clinician or representative by the Radiologist Assistant, and communication documented in the PACS or [employer]. Electronically Signed   By: Wiliam Ke M.D.   On: 11/02/2023 18:09   DG CHEST PORT 1 VIEW  Result Date: 11/02/2023 CLINICAL DATA:  Encounter for central line placement. EXAM: PORTABLE CHEST 1 VIEW COMPARISON:  11/02/2023 FINDINGS: Right jugular central line has been placed. Jugular central line tip is in the upper right atrium region. Negative for pneumothorax. Again noted is a right arm PICC line with the tip in the SVC region. Nasogastric tube extends into the abdomen but the tip is beyond the image. Tip of the endotracheal tube is approximately 1.1 cm above the carina. Subtle densities at the right lung base may represent atelectasis. Otherwise, no focal lung disease. Negative for a pneumothorax. IMPRESSION: 1. Right jugular central line with the tip in the upper right atrium region. Negative for pneumothorax. 2. Endotracheal tube is low lying and just above the carina as described. 3. Subtle densities at the right lung base could represent atelectasis. Electronically Signed   By: Richarda Overlie M.D.   On: 11/02/2023 16:52   DG CHEST PORT 1 VIEW  Result Date: 11/02/2023 CLINICAL DATA:  PICC  (peripherally inserted central catheter) flush [id number] EXAM: PORTABLE CHEST 1 VIEW COMPARISON:  11/01/2023 FINDINGS: Right arm PICC line appears stable. The tip is in the SVC region. Endotracheal tube tip is near the carina. Nasogastric tube extends into the abdomen but the tip is beyond the image. Slightly low lung volumes without focal disease. No pulmonary edema. Negative for a pneumothorax. IMPRESSION: 1. Right arm PICC line appears stable. Tip is in the SVC region. 2. Endotracheal tube tip is near the carina. 3. No focal lung disease. Electronically Signed   By: Richarda Overlie M.D.   On: 11/02/2023 16:49   US Abdomen Limited RUQ (LIVER/GB)  Result Date: 11/02/2023 CLINICAL DATA:  Abdominal pain. EXAM: ULTRASOUND ABDOMEN LIMITED RIGHT UPPER QUADRANT COMPARISON:  October 31, 2023. FINDINGS: Gallbladder: No gallstones or wall thickening visualized. No sonographic Murphy sign noted by sonographer. Mild amount of sludge is noted within gallbladder lumen. Common bile duct: Diameter: 4 mm which is within normal limits. Liver: No focal lesion identified. Within normal limits in parenchymal echogenicity. Portal vein is patent on color Doppler imaging with normal direction of blood flow towards the liver. Other: Minimal ascites is noted. IMPRESSION: Small amount of sludge present within gallbladder lumen. Minimal ascites. Electronically Signed   By: Lupita Raider M.D.   On: 11/02/2023 15:30   DG Abd 1 View  Result Date: 11/02/2023 CLINICAL DATA:  [id number] Encounter for orogastric (OG) tube placement [id number] EXAM: ABDOMEN - 1 VIEW COMPARISON:  X-ray abdomen 11/01/2023 FINDINGS: Enteric tube courses below the hemidiaphragm with tip and side port overlying the expected region of the gastric lumen. PO contrast noted within the colon. The bowel gas pattern is normal. No radio-opaque calculi or other significant radiographic abnormality are seen. IMPRESSION: Enteric tube in good position. Electronically Signed   By:  Tish Frederickson M.D.   On: 11/02/2023 02:06   DG Chest Port 1 View  Result Date: 11/01/2023 CLINICAL DATA:  Endotracheal tube. EXAM: PORTABLE CHEST 1 VIEW COMPARISON:  Same day. FINDINGS: Endotracheal and nasogastric tubes are in grossly  good position. Mild right basilar atelectasis or infiltrate is noted. Right-sided PICC line is unchanged. IMPRESSION: Endotracheal and nasogastric tubes are in grossly good position. Mild right basilar atelectasis or infiltrate. Electronically Signed   By: Lupita Raider M.D.   On: 11/01/2023 17:11   DG Abd 1 View  Result Date: 11/01/2023 CLINICAL DATA:  NG tube placement EXAM: ABDOMEN - 1 VIEW COMPARISON:  10/31/2023 FINDINGS: Limited radiograph of the lower chest and upper abdomen was obtained for the purposes of enteric tube localization. Enteric tube is seen coursing below the diaphragm with distal tip terminating within the expected location of the gastric antrum. IMPRESSION: Enteric tube terminates within the expected location of the gastric antrum. Recommend advancement 5-10 cm. Electronically Signed   By: Duanne Guess D.O.   On: 11/01/2023 14:38   CT ABDOMEN PELVIS W CONTRAST  Addendum Date: 11/01/2023   ADDENDUM REPORT: 11/01/2023 08:10 ADDENDUM: Received a call concerning gallbladder findings. Impression 4 most likely reflects description of the dilated distal stomach. The gallbladder is nondistended and there are no calcified stones. Electronically Signed   By: Tiburcio Pea M.D.   On: 11/01/2023 08:10   Result Date: 11/01/2023 CLINICAL DATA:  Peritonitis or perforation suspected. EXAM: CT ABDOMEN AND PELVIS WITH CONTRAST TECHNIQUE: Multidetector CT imaging of the abdomen and pelvis was performed using the standard protocol following bolus administration of intravenous contrast. RADIATION DOSE REDUCTION: This exam was performed according to the departmental dose-optimization program which includes automated exposure control, adjustment of the mA  and/or kV according to patient size and/or use of iterative reconstruction technique. CONTRAST:  OMNIPAQUE IOHEXOL 300 MG/ML  SOLN COMPARISON:  CT chest abdomen and pelvis 10/27/2023 FINDINGS: Lower chest: There is atelectasis in the lung bases. Hepatobiliary: No focal liver abnormality is seen. The gallbladder is now dilated and contains multiple gallstones. There is a new small focus of air in the gallbladder. Findings concerning for complex dilated common bile duct. There is inflammatory stranding surrounding the common bile duct. Pancreas: Unremarkable. No pancreatic ductal dilatation or surrounding inflammatory changes. Spleen: Normal in size without focal abnormality. Adrenals/Urinary Tract: There is a small fat containing right adrenal nodule compatible with myelolipoma measuring 7 mm. The left adrenal gland is within normal limits. There are bilateral renal cysts. The largest is in the superior pole the right kidney measuring 4.8 cm. Mildly hyperdense lesion in the inferior pole of the left kidney measures 2.5 cm and appears unchanged. There is no hydronephrosis or perinephric fat stranding. Tiny bladder calculus is seen lying in the dependent portion of the bladder. Stomach/Bowel: There is wall thickening of the distal esophagus. There is a new gastroduodenal anastomosis near the greater curvature of the stomach. The stomach is markedly dilated with large air-fluid level as seen on prior. Heterogeneous masslike density seen in the region of the first and second portion of the duodenal measuring 5.3 x 4.5 by 5.9 cm with some new central fluid areas of attenuation/low-density. There is surrounding inflammatory stranding. There is a new small amount of free air throughout the upper abdomen. Remaining small bowel and colon are within normal limits. Oral contrast reaches the rectum. There are few scattered sigmoid colon diverticula. The appendix is not seen. Vascular/Lymphatic: Aortic atherosclerosis. No  enlarged abdominal or pelvic lymph nodes. Reproductive: Prostate gland is enlarged. Other: Small fat containing umbilical hernia. There is no ascites. Small amount of free air throughout the upper abdomen. No evidence for abscess. Musculoskeletal: The bones are osteopenic. Degenerative changes affect the  spine. IMPRESSION: 1. New small amount of free air throughout the upper abdomen may be related to recent abdominal surgery versus perforated viscus. 2. New gastroduodenal anastomosis. 3. Heterogeneous masslike density in the region of the first and second portion of the duodenum with some new central fluid areas of attenuation. Findings may represent hematoma, mass and/or infection. 4. New dilated gallbladder with multiple gallstones. There is a new small focus of air in the gallbladder. Findings are concerning for cholecystitis with possible emphysematous cholecystitis. Findings may also be related to direct connection of gallbladder with duodenum. Surgical consultation recommended. 5. Complex dilated common bile duct with inflammatory stranding surrounding the common bile duct. Findings may be related to choledocholithiasis. 6. Stable indeterminate 2.5 cm left renal lesion. This can be further evaluated with MRI. 7. Stable marked dilatation of the stomach with large air-fluid level. 8. Stable wall thickening of the distal esophagus. Aortic Atherosclerosis (ICD10-I70.0) and Emphysema (ICD10-J43.9). These results were called by telephone at the time of interpretation on 10/31/2023 at 10:47 pm to provider OLADAPO ADEFESO , who verbally acknowledged these results. Electronically Signed: By: Darliss Cheney M.D. On: 10/31/2023 22:48   DG CHEST PORT 1 VIEW  Result Date: 11/01/2023 CLINICAL DATA:  Recent abdominal surgery. Respiratory distress. [id number]. EXAM: PORTABLE CHEST 1 VIEW COMPARISON:  Portable chest 10/31/2023 FINDINGS: 7:33 a.m. Right PICC terminates in the mid SVC at the azygous confluence. There has been a  slight interval pullback. There is mild cardiomegaly but no evidence of CHF. The mediastinal configuration is stable with aortic tortuosity and atherosclerosis. The lungs are clear apart from again noted small left pleural effusion. No new osseous abnormality. Free air underneath the domes of the diaphragm is less conspicuous today. IMPRESSION: 1. Right PICC tip in the SVC at the azygous confluence with a slight interval pullback. 2. Small left pleural effusion. No other acute radiographic chest findings. 3. Free air underneath the domes of the diaphragm is less conspicuous today. Electronically Signed   By: Almira Bar M.D.   On: 11/01/2023 07:53   CT HEAD WO CONTRAST ( )  Result Date: 10/31/2023 CLINICAL DATA:  Initial evaluation for altered mental status. EXAM: CT HEAD WITHOUT CONTRAST TECHNIQUE: Contiguous axial images were obtained from the base of the skull through the vertex without intravenous contrast. RADIATION DOSE REDUCTION: This exam was performed according to the departmental dose-optimization program which includes automated exposure control, adjustment of the mA and/or kV according to patient size and/or use of iterative reconstruction technique. COMPARISON:  Prior CT from 10/30/2015. FINDINGS: Brain: Age-related cerebral atrophy with moderately advanced chronic microvascular ischemic disease. No acute intracranial hemorrhage. 2.3 cm hypodensity seen involving the superior right insula/right frontal operculum, age indeterminate, but could reflect an evolving acute ischemic infarct (series 2, image 17). No other acute large vessel territory infarct. No mass lesion or midline shift. No hydrocephalus or extra-axial fluid collection. Vascular: No abnormal hyperdense vessel. Calcified atherosclerosis present at the skull base. Skull: Scalp soft tissues demonstrate no acute finding. Calvarium intact. Sinuses/Orbits: Globes orbital soft tissues within normal limits. Paranasal sinuses and mastoid  air cells are clear. Other: None. IMPRESSION: 1. 2.3 cm hypodensity involving the superior right insula/right frontal operculum, age indeterminate, but could reflect an evolving acute ischemic infarct. Correlation with dedicated MRI suggested for further evaluation. 2. No other acute intracranial abnormality. 3. Age-related cerebral atrophy with moderately advanced chronic microvascular ischemic disease. Electronically Signed   By: Rise Mu M.D.   On: 10/31/2023 22:13   DG Chest [facility]  1 View  Result Date: 10/31/2023 CLINICAL DATA:  Respiratory distress.  Recent abdominal surgery. EXAM: PORTABLE CHEST 1 VIEW COMPARISON:  Abdominal x-ray 10/30/2023. FINDINGS: Right upper extremity PICC terminates over the SVC. The lungs are clear. There is no pleural effusion or pneumothorax. The cardiomediastinal silhouette is within normal limits. There is a small amount of free air in the upper abdomen. IMPRESSION: 1. No active disease in the chest. 2. Small amount of free air in the upper abdomen, likely related to recent abdominal surgery. Please correlate clinically. If there is high clinical concern for acute pathology, recommend CT. Electronically Signed   By: Darliss Cheney M.D.   On: 10/31/2023 19:56   DG Abd 1 View  Result Date: 10/30/2023 CLINICAL DATA:  Status post bypass gastrojejunostomy. EXAM: ABDOMEN - 1 VIEW COMPARISON:  Upper GI examination on 10/28/2020 and abdominal radiograph on 10/30/2023 at 1248 hours FINDINGS: Upper abdomen is only partially imaged. There is retained contrast in the stomach. Nasogastric tube is partially imaged. There is contrast within the pelvis and right side of the abdomen. There may be a small amount of contrast in the right colon. IMPRESSION: 1. Residual contrast in the stomach. Stomach is only partially imaged on this examination. 2. Contrast in small bowel loops and probably right colon. Electronically Signed   By: Richarda Overlie M.D.   On: 10/30/2023 16:59   DG Abd  Portable 1V  Result Date: 10/30/2023 CLINICAL DATA:  Nasogastric tube placement EXAM: PORTABLE ABDOMEN - 1 VIEW COMPARISON:  10/25/2023.  Upper GI from yesterday. FINDINGS: Enteric tube tip reaches the stomach where there is opacification from contrast. Limited coverage of the upper abdomen shows no dilated bowel. Clear lung bases. IMPRESSION: Enteric tube with tip reaching the stomach where there is opacification by contrast. Electronically Signed   By: Tiburcio Pea M.D.   On: 10/30/2023 05:41   DG UGI W SINGLE CM (SOL OR THIN BA)  Result Date: 10/29/2023 CLINICAL DATA:  History of gastric outlet obstruction, post surgical creation of a gastrojejunostomy. Please perform targeted upper GI series to evaluate patency of the gastrojejunostomy as well as to exclude the presence of a leak. EXAM: DG UGI W SINGLE CM TECHNIQUE: Single contrast examination was then performed using Gastrografin. FLUOROSCOPY: 3 minutes (58.8 mGy) COMPARISON:  CT abdomen and pelvis-10/22/2023 FINDINGS: Patient was placed supine on the fluoroscopy table Preprocedural spot fluoroscopic and radiographic images were obtained of the upper abdomen demonstrating enteric tube tip and side port project over the expected location of the gastric antrum/duodenal bulb. Note is made of a midline surgical clips. Contrast was injected via the nasogastric tube with opacification of the gastric antrum and duodenal bulb, ultimately with faint opacification of the descending portion of the duodenum. The nasogastric tube was retracted to the level of the mid body of the stomach, regional to the expected location of the surgically created gastrojejunostomy. Despite the administration of additional contrast, obtaining delayed imaging and positioning of the fluoroscopy table at an approximately 45 degree angle, there is no definitive passage of contrast from the markedly dilated stomach through the gastrojejunostomy. Nasogastric tube was secured at the  nasal brim after re-advancing the tube with tip and side port ultimately terminating within mid body of the stomach. IMPRESSION: No definitive opacification of surgically created gastrojejunostomy, potentially technique related due to persistent marked gastric distention. Above findings discussed with referring surgeon, Dr. Robyne Peers, at the time of procedure completion. Electronically Signed   By: Simonne Come M.D.   On:  10/29/2023 16:00   ECHOCARDIOGRAM COMPLETE  Result Date: 10/27/2023    ECHOCARDIOGRAM REPORT   Patient Name:   KINNIE LAMBERTI Date of Exam: 10/27/2023 Medical Rec #:  [id number]      Height:       72.0 in Accession #:    [id number]     Weight:       188.1 lb Date of Birth:  Dec 04, 1951      BSA:          2.076 m Patient Age:    72 years       BP:           166/92 mmHg Patient Gender: M              HR:           90 bpm. [facility] Procedure: 2D Echo, Cardiac Doppler, Color Doppler and Intracardiac            Opacification Agent Indications:    Pulmonary embolus  History:        Patient has no prior history of Echocardiogram examinations.                 CKD3 and COPD; Risk Factors:Current Smoker, Hypertension and                 Dyslipidemia.  Sonographer:    Dondra Prader RVT RCS Referring Phys: [id number] CATHERINE A PAPPAYLIOU  Sonographer Comments: Technically challenging study due to limited acoustic windows, suboptimal subcostal window and suboptimal apical window. Image acquisition challenging due to respiratory motion, unable to breath hold and Image acquisition challenging due to COPD. IMPRESSIONS  1. Technically difficult study.  2. Left ventricular ejection fraction, by estimation, is >75%. The left ventricle has normal function. There is moderate left ventricular hypertrophy. Left ventricular diastolic parameters were normal.  3. Right ventricular is not well-seen. Right ventricular size grossly normal, systolic function visually preserved.  4. Right atrial size was visually  normal.  5. The mitral valve is grossly normal. No evidence of mitral valve regurgitation. No evidence of mitral stenosis.  6. The aortic valve is tricuspid. Aortic valve regurgitation is not visualized. No aortic stenosis is present.  7. There is dilatation of the ascending aorta, measuring 41 mm.  8. The inferior vena cava is dilated in size with >50% respiratory variability, suggesting right atrial pressure of 8 mmHg. Comparison(s): No prior Echocardiogram. FINDINGS  Left Ventricle: Left ventricular ejection fraction, by estimation, is >75%. The left ventricle has normal function. Definity contrast agent was given IV to delineate the left ventricular endocardial borders. The left ventricular internal cavity size was  small. There is moderate left ventricular hypertrophy. Left ventricular diastolic parameters were normal. Right Ventricle: Right ventricular is not well-seen. Right ventricular size grossly normal, systolic function visually preserved. Left Atrium: Left atrial size was normal in size. Right Atrium: Right atrial size was visually normal. Pericardium: There is no evidence of pericardial effusion. Mitral Valve: The mitral valve is grossly normal. No evidence of mitral valve regurgitation. No evidence of mitral valve stenosis. Tricuspid Valve: The tricuspid valve is normal in structure. Tricuspid valve regurgitation is not demonstrated. No evidence of tricuspid stenosis. Aortic Valve: The aortic valve is tricuspid. Aortic valve regurgitation is not visualized. No aortic stenosis is present. Aortic valve mean gradient measures 3.0 mmHg. Aortic valve peak gradient measures 5.9 mmHg. Aortic valve area, by VTI measures 2.87 cm. Pulmonic Valve: The pulmonic valve was normal in structure.  Pulmonic valve regurgitation is not visualized. No evidence of pulmonic stenosis. Aorta: The aortic root is normal in size and structure. There is dilatation of the ascending aorta, measuring 41 mm. Venous: The inferior vena  cava is dilated in size with greater than 50% respiratory variability, suggesting right atrial pressure of 8 mmHg. IAS/Shunts: The interatrial septum was not well visualized.  LEFT VENTRICLE PLAX 2D LVIDd:         3.70 cm   Diastology LVIDs:         2.10 cm   LV e' medial:    3.57 cm/s LV PW:         1.60 cm   LV E/e' medial:  12.3 LV IVS:        1.30 cm   LV e' lateral:   4.14 cm/s LVOT diam:     2.10 cm   LV E/e' lateral: 10.6 LV SV:         53 LV SV Index:   25 LVOT Area:     3.46 cm  RIGHT VENTRICLE             IVC RV S prime:     11.40 cm/s  IVC diam: 2.10 cm LEFT ATRIUM             Index LA diam:        3.30 cm 1.59 cm/m LA Vol (A2C):   36.6 ml 17.63 ml/m LA Vol (A4C):   37.6 ml 18.12 ml/m LA Biplane Vol: 39.6 ml 19.08 ml/m  AORTIC VALVE                    PULMONIC VALVE AV Area (Vmax):    3.14 cm     PV Vmax:       0.70 m/s AV Area (Vmean):   2.99 cm     PV Peak grad:  2.0 mmHg AV Area (VTI):     2.87 cm AV Vmax:           121.00 cm/s AV Vmean:          77.033 cm/s AV VTI:            0.183 m AV Peak Grad:      5.9 mmHg AV Mean Grad:      3.0 mmHg LVOT Vmax:         109.67 cm/s LVOT Vmean:        66.533 cm/s LVOT VTI:          0.152 m LVOT/AV VTI ratio: 0.83  AORTA Ao Root diam: 3.70 cm Ao Asc diam:  4.10 cm MITRAL VALVE MV Area (PHT): 3.77 cm     SHUNTS MV Decel Time: 201 msec     Systemic VTI:  0.15 m MV E velocity: 44.00 cm/s   Systemic Diam: 2.10 cm MV A velocity: 110.00 cm/s MV E/A ratio:  0.40 Sunit Tolia Electronically signed by Tessa Lerner Signature Date/Time: 10/27/2023/2:57:03 PM    Final    US Venous Img Lower Bilateral (DVT)  Result Date: 10/27/2023 CLINICAL DATA:  Acute pulmonary embolus. Tobacco use. Hypoxia and hospital. Respiratory failure acute. History of diabetes, obesity, COPD. Concern for DVT. EXAM: BILATERAL LOWER EXTREMITY VENOUS DOPPLER ULTRASOUND TECHNIQUE: Gray-scale sonography with graded compression, as well as color Doppler and duplex ultrasound were performed to  evaluate the lower extremity deep venous systems from the level of the common femoral vein and including the common femoral, femoral, profunda femoral, popliteal and calf veins including  the posterior tibial, peroneal and gastrocnemius veins when visible. The superficial great saphenous vein was also interrogated. Spectral Doppler was utilized to evaluate flow at rest and with distal augmentation maneuvers in the common femoral, femoral and popliteal veins. COMPARISON:  None Available. FINDINGS: RIGHT LOWER EXTREMITY Common Femoral Vein: No evidence of thrombus. Normal compressibility, respiratory phasicity and response to augmentation. Saphenofemoral Junction: No evidence of thrombus. Normal compressibility and flow on color Doppler imaging. Profunda Femoral Vein: No evidence of thrombus. Normal compressibility and flow on color Doppler imaging. Femoral Vein: No evidence of thrombus. Normal compressibility, respiratory phasicity and response to augmentation. Popliteal Vein: No evidence of thrombus. Normal compressibility, respiratory phasicity and response to augmentation. Calf Veins: No evidence of thrombus. Normal compressibility and flow on color Doppler imaging. Superficial Great Saphenous Vein: No evidence of thrombus. Normal compressibility. Venous Reflux:  None. Other Findings:  None. LEFT LOWER EXTREMITY Common Femoral Vein: No evidence of thrombus. Normal compressibility, respiratory phasicity and response to augmentation. Saphenofemoral Junction: No evidence of thrombus. Normal compressibility and flow on color Doppler imaging. Profunda Femoral Vein: No evidence of thrombus. Normal compressibility and flow on color Doppler imaging. Femoral Vein: No evidence of thrombus. Normal compressibility, respiratory phasicity and response to augmentation. Popliteal Vein: No evidence of thrombus. Normal compressibility, respiratory phasicity and response to augmentation. Calf Veins: No evidence of thrombus. Normal  compressibility and flow on color Doppler imaging. Superficial Great Saphenous Vein: No evidence of thrombus. Normal compressibility. Venous Reflux:  None. Other Findings:  None. IMPRESSION: No evidence of deep venous thrombosis in either lower extremity. Electronically Signed   By: Tish Frederickson M.D.   On: 10/27/2023 01:41   DG Abd Portable 1V  Result Date: 10/25/2023 CLINICAL DATA:  NG placement EXAM: PORTABLE ABDOMEN - 1 VIEW COMPARISON:  Abdominal radiograph dated 10/25/2023. FINDINGS: Interval progression of the enteric tube with tip and side-port in the left upper abdomen likely in the proximal stomach. IMPRESSION: Enteric tube with tip and side-port in the proximal stomach. Electronically Signed   By: Elgie Collard M.D.   On: 10/25/2023 18:15   [place] EKG SITE RITE  Result Date: 10/25/2023 If Site Rite image not attached, placement could not be confirmed due to current cardiac rhythm.  DG Abd Portable 1V  Result Date: 10/25/2023 CLINICAL DATA:  NG tube placement EXAM: PORTABLE ABDOMEN - 1 VIEW COMPARISON:  KUB 2 days prior, CT abdomen/pelvis 1 day prior FINDINGS: The enteric catheter tip is in the stomach and sidehole is in the distal esophagus. There is a nonobstructive bowel gas pattern. There is no definite free intraperitoneal air. There is no abnormal soft tissue calcification. Contrast is noted in the bladder. There is no acute osseous abnormality. IMPRESSION: Enteric catheter sidehole in the distal esophagus. Recommend advancement by approximately 8 cm. These results will be called to the ordering clinician or representative by the Radiologist Assistant, and communication documented in the PACS or [employer]. Electronically Signed   By: Lesia Hausen M.D.   On: 10/25/2023 13:34   DG CHEST PORT 1 VIEW  Result Date: 11/14/2023 CLINICAL DATA:  Intubation. EXAM: PORTABLE CHEST 1 VIEW COMPARISON:  10/19/2023. FINDINGS: The heart size and mediastinal contours are stable. There is  atherosclerotic calcification of the aorta. Mild airspace disease is noted at the left lung base. No effusion or pneumothorax is seen. An endotracheal tube terminates 4.4 cm above the carina. The side port of an enteric tube is above the level of the diaphragm and should be advanced 14  cm. IMPRESSION: 1. Mild airspace disease at the left lung base, likely atelectasis. 2. Endotracheal tube terminates 4.4 cm above the carina. 3. The enteric tube terminates above the level of the diaphragm and should be advanced 14 cm. Electronically Signed   By: Thornell Sartorius M.D.   On: 11/08/2023 20:14   CT Angio Chest/Abd/Pel for Dissection W and/or W/WO  Result Date: 10/29/2023 CLINICAL DATA:  Arterial embolism suspected, non-extremity, determine source rule out chest PE, rule out GI ischemia per Dr. Jena Gauss EXAM: CT ANGIOGRAPHY CHEST, ABDOMEN AND PELVIS TECHNIQUE: Non-contrast CT of the chest was initially obtained. Multidetector CT imaging through the chest, abdomen and pelvis was performed using the standard protocol during bolus administration of intravenous contrast. Multiplanar reconstructed images and MIPs were obtained and reviewed to evaluate the vascular anatomy. RADIATION DOSE REDUCTION: This exam was performed according to the departmental dose-optimization program which includes automated exposure control, adjustment of the mA and/or kV according to patient size and/or use of iterative reconstruction technique. CONTRAST:  75mL OMNIPAQUE IOHEXOL 350 MG/ML SOLN COMPARISON:  10/29/2023, 09/30/2023 FINDINGS: CTA CHEST FINDINGS Cardiovascular: Heart is borderline in size. Scattered coronary artery and aortic calcifications. 4 cm ascending thoracic aortic aneurysm. No dissection. Pulmonary arteries are not adequately opacified to evaluate for pulmonary emboli on this aortic/dissection study. Mediastinum/Nodes: No mediastinal, hilar, or axillary adenopathy. Endotracheal tube in the mid to lower trachea. Thyroid and  esophagus unremarkable. Lungs/Pleura: Small left pleural effusion, slightly increased since prior study. Increasing left basilar atelectasis or infiltrate. Musculoskeletal: Chest wall soft tissues are unremarkable. No acute bony abnormality. Review of the MIP images confirms the above findings. CTA ABDOMEN AND PELVIS FINDINGS VASCULAR Aorta: Aortic atherosclerosis.  No aneurysm or dissection. Celiac: Patent without evidence of aneurysm, dissection, vasculitis or significant stenosis. SMA: Patent without evidence of aneurysm, dissection, vasculitis or significant stenosis. Renals: Single left renal artery and 2 right renal arteries. No evidence of stenosis. Scattered calcifications. IMA: Patent Inflow: Moderate calcifications through the iliofemoral vessels. No aneurysm, dissection or significant stenosis. Veins: No obvious venous abnormality within the limitations of this arterial phase study. Review of the MIP images confirms the above findings. NON-VASCULAR Hepatobiliary: No focal hepatic abnormality. Gallbladder unremarkable. Pancreas: No focal abnormality or ductal dilatation. Spleen: No focal abnormality.  Normal size. Adrenals/Urinary Tract: No adrenal nodules. Bilateral renal cysts appear benign with no additional follow-up recommended. No stones or hydronephrosis. Small layering mobile stones in the left side of the urinary bladder. Stomach/Bowel: Marked distention of the stomach again noted. Duodenal bulb is distended with fluid as well. There appears to be circumferential wall thickening within the descending duodenum with surrounding inflammation/stranding. Overall, this appears slightly worsened since prior study. Remainder of small bowel decompressed. Colonic diverticulosis. No active diverticulitis. Lymphatic: No adenopathy Reproductive: Prostate enlargement. Other: No free fluid or free air. Musculoskeletal: No acute bony abnormality. Review of the MIP images confirms the above findings. IMPRESSION:  No evidence of aortic aneurysm or dissection. Aortic atherosclerosis. Mesenteric vessels appear widely patent. Unable to assess/evaluate for pulmonary emboli given the bolus timing to adequately opacify and evaluate the aorta. Coronary artery disease. Small left pleural effusion with left basilar atelectasis or infiltrate, increasing since prior study. Marked distention of the stomach with fluid and air. This appears to be due to wall thickening and surrounding inflammation in the region of the descending duodenum. This could be related to severe duodenitis or possibly duodenal wall hematoma. Findings concerning for gastric outlet obstruction. The patient may benefit from NG tube decompression. Electronically  Signed   By: Charlett Nose M.D.   On: 11/08/2023 19:22   DG Abdomen 1 View  Result Date: 11/02/2023 CLINICAL DATA:  Nasogastric tube placement EXAM: ABDOMEN - 1 VIEW COMPARISON:  None Available. FINDINGS: The nasogastric tube is looped within the distal esophagus with its tip within the distal esophagus overlying T7. The abdominal gas pattern is nonspecific due to a paucity of intra-abdominal gas. Contrast is seen within a nondilated renal collecting system bilaterally. IMPRESSION: 1. Nasogastric tube looped within the distal esophagus with its tip within the distal esophagus overlying T7. Repositioning is recommended. Electronically Signed   By: Helyn Numbers M.D.   On: 11/10/2023 18:16   US ABDOMEN LIMITED WITH LIVER DOPPLER  Result Date: 10/21/2023 CLINICAL DATA:  Portal vein thrombus EXAM: DUPLEX ULTRASOUND OF LIVER TECHNIQUE: Color and duplex Doppler ultrasound was performed to evaluate the hepatic in-flow and out-flow vessels. COMPARISON:  CT 10/21/2023 FINDINGS: Liver: Normal parenchymal echogenicity. Normal hepatic contour without nodularity. No focal lesion, mass or intrahepatic biliary ductal dilatation. Limited images of the gallbladder are unremarkable. Main Portal Vein size: 20 mm cm Portal  Vein Velocities Main Prox:  69 cm/sec Main Mid: 48 cm/sec Main Dist:  40 cm/sec Right: 29 cm/sec Left: 29 cm/sec Flow within the main, right, and left portal veins is hepatopetal (antegrade) Hepatic Vein Velocities Right:  81 cm/sec Middle:  33 cm/sec Left:  36 cm/sec Flow within hepatic veins demonstrates appropriate directional flow (hepatofugal) IVC: Present and patent with normal respiratory phasicity. Hepatic Artery Velocity:  95 cm/sec Splenic Vein Velocity:  39 cm/sec Spleen: 10.3 cm x 9.6 cm x 3.7 cm with a total volume of 191 cm^3 (411 cm^3 is upper limit normal) Portal Vein Occlusion/Thrombus: Not identified. Focal thrombus noted on prior CT examination is not visualized on the current exam. Splenic Vein Occlusion/Thrombus: No Ascites: None Varices: None IMPRESSION: 1. Normal hepatic Doppler examination. No evidence of portal vein thrombosis. Electronically Signed   By: Helyn Numbers M.D.   On: 10/31/2023 18:14   CT ABDOMEN PELVIS W CONTRAST  Result Date: 11/13/2023 CLINICAL DATA:  Several day history of abdominal pain and emesis EXAM: CT ABDOMEN AND PELVIS WITH CONTRAST TECHNIQUE: Multidetector CT imaging of the abdomen and pelvis was performed using the standard protocol following bolus administration of intravenous contrast. RADIATION DOSE REDUCTION: This exam was performed according to the departmental dose-optimization program which includes automated exposure control, adjustment of the mA and/or kV according to patient size and/or use of iterative reconstruction technique. CONTRAST:  OMNIPAQUE IOHEXOL 300 MG/ML  SOLN COMPARISON:  CT abdomen and pelvis dated 09/30/2023 FINDINGS: Lower chest: No focal consolidation or pulmonary nodule in the lung bases. Pleural effusion. Partially imaged heart size is normal. Coronary artery calcifications. Partially imaged hypodensity within a right lower lobe pulmonary artery (2:1). Hepatobiliary: No focal hepatic lesions. No intra or extrahepatic biliary  ductal dilation. Normal gallbladder. Pancreas: Mild peripheral hypodensity involving the pancreatic head and neck adjacent to the duodenal inflammation. No focal lesions or main ductal dilation. Spleen: Normal in size without focal abnormality. Adrenals/Urinary Tract: No adrenal nodules. Bilateral renal cysts, a few of which are minimally complicated. No specific follow-up imaging recommended. No hydronephrosis or calculi. Mobile stones within the urinary bladder. Stomach/Bowel: Circumferential mural thickening of the distal esophagus. Markedly distended stomach to the level of the proximal duodenum where there is again circumferential mural thickening and heterogeneous hypoattenuation no evidence of bowel wall thickening, distention, or inflammatory changes. Colonic diverticulosis without acute diverticulitis. Normal  appendix. Vascular/Lymphatic: Aortic atherosclerosis. New focal hypoattenuation within the upstream portal vein (2:26). No enlarged abdominal or pelvic lymph nodes. Reproductive: Markedly enlarged prostate gland. Other: No free fluid, fluid collection, or free air. Musculoskeletal: No acute or abnormal lytic or blastic osseous lesions. Multilevel degenerative changes of the partially imaged thoracic and lumbar spine. Small fat-containing paraumbilical hernia. IMPRESSION: 1. Markedly distended stomach to the level of the proximal duodenum where there is again circumferential mural thickening and heterogeneous hypoattenuation at the site of previously noted duodenitis. Findings suspicious for gastric outlet obstruction. 2. New focal hypoattenuation within the upstream portal vein, suspicious for nonocclusive thrombosis. 3. Partially imaged hypodensity within a right lower lobe pulmonary artery may be artifactual or represent a pulmonary embolism. 4. Mild peripheral hypodensity involving the pancreatic head and neck adjacent to the duodenal inflammation, which may be reactive. 5. Circumferential mural  thickening of the distal esophagus, which may be due to esophagitis. 6. Markedly enlarged prostate gland. 7. Mobile stones within the urinary bladder. 8.  Aortic Atherosclerosis (ICD10-I70.0). Critical Value/emergent results were called by telephone at the time of interpretation on 11/03/2023 at 1:51 pm to provider [facility], who verbally acknowledged these results. Electronically Signed   By: Agustin Cree M.D.   On: 10/22/2023 13:55   DG Chest Portable 1 View  Result Date: 10/27/2023 CLINICAL DATA:  Weakness. EXAM: PORTABLE CHEST 1 VIEW COMPARISON:  December 08, 2022. FINDINGS: The heart size and mediastinal contours are within normal limits. Both lungs are clear. The visualized skeletal structures are unremarkable. IMPRESSION: No active disease. Electronically Signed   By: Lupita Raider M.D.   On: 10/28/2023 12:11    Microbiology Recent Results (from the past 240 hour(s))  Culture, blood (Routine X 2) w Reflex to ID Panel     Status: None   Collection Time: 10/31/23  6:14 PM   Specimen: BLOOD  Result Value Ref Range Status   Specimen Description BLOOD BLOOD LEFT HAND  Final   Special Requests   Final    BOTTLES DRAWN AEROBIC AND ANAEROBIC Blood Culture adequate volume   Culture   Final    NO GROWTH 5 DAYS Performed at [facility] [facility], [address], [place], [place] [address]    Report Status 11/05/2023 FINAL  Final  MRSA Next Gen by PCR, Nasal     Status: None   Collection Time: 10/31/23  7:07 PM   Specimen: Nasal Mucosa; Nasal Swab  Result Value Ref Range Status   MRSA by PCR Next Gen NOT DETECTED NOT DETECTED Final    Comment: (NOTE) The GeneXpert MRSA Assay (FDA approved for NASAL specimens only), is one component of a comprehensive MRSA colonization surveillance program. It is not intended to diagnose MRSA infection nor to guide or monitor treatment for MRSA infections. Test performance is not FDA approved in patients less than 67 years old. Performed at [facility], [address] [place], [place] [address]   Culture, blood (Routine X 2) w Reflex to ID Panel     Status: None   Collection Time: 10/31/23  7:09 PM   Specimen: BLOOD  Result Value Ref Range Status   Specimen Description BLOOD BLOOD LEFT ARM  Final   Special Requests   Final    BOTTLES DRAWN AEROBIC AND ANAEROBIC Blood Culture results may not be optimal due to an inadequate volume of blood received in culture bottles   Culture   Final    NO GROWTH 5 DAYS Performed at [facility],  [address], [place], [place] [address]    Report Status 11/05/2023 FINAL  Final  MRSA Next Gen by PCR, Nasal     Status: None   Collection Time: 11/01/23  1:35 PM   Specimen: Nasal Mucosa; Nasal Swab  Result Value Ref Range Status   MRSA by PCR Next Gen NOT DETECTED NOT DETECTED Final    Comment: (NOTE) The GeneXpert MRSA Assay (FDA approved for NASAL specimens only), is one component of a comprehensive MRSA colonization surveillance program. It is not intended to diagnose MRSA infection nor to guide or monitor treatment for MRSA infections. Test performance is not FDA approved in patients less than 26 years old. Performed at [facility], [address] [place], [place] [address]   Culture, Respiratory w Gram Stain     Status: None   Collection Time: 11/01/23  1:54 PM   Specimen: Tracheal Aspirate; Respiratory  Result Value Ref Range Status   Specimen Description TRACHEAL ASPIRATE  Final   Special Requests NONE  Final   Gram Stain   Final    RARE WBC PRESENT, PREDOMINANTLY PMN ABUNDANT SQUAMOUS EPITHELIAL CELLS PRESENT MODERATE GRAM POSITIVE COCCI IN CLUSTERS MODERATE BUDDING YEAST SEEN FEW GRAM POSITIVE RODS    Culture   Final    ABUNDANT Normal respiratory flora-no Staph aureus or Pseudomonas seen Performed at [facility], [address], [place], [place] [address]    Report Status 11/04/2023 FINAL  Final    Lab Basic Metabolic Panel: Recent Labs  Lab 11/04/23 0325 11/04/23 2219  11/05/23 0407 11/06/23 0515 11/07/23 0625 11/08/23 0434  NA 135  --  135 136 135 130*  K 3.0*  --  3.6 3.6 3.8 3.5  CL 105  --  105 110 107 104  CO2 20*  --  19* 17* 15* 14*  GLUCOSE 229*  --  202* 208* 224* 233*  BUN 82*  --  93* 115* 162* 200*  CREATININE 1.67*  --  1.74* 2.05* 3.11* 4.11*  CALCIUM 10.2  --  10.1 10.2 10.6* 10.5*  MG 2.4  --  2.3 2.5* 2.5* 2.5*  PHOS 1.4* 2.4* 2.6 4.7* 5.9* 6.8*   Liver Function Tests: Recent Labs  Lab 11/04/23 0325 11/05/23 0407 11/06/23 0515 11/07/23 0625 11/08/23 0434  AST 20 22 40 104* 48*  ALT 27 27 41 96* 91*  ALKPHOS 34* 43 49 56 52  BILITOT 0.6 0.6 0.6 0.9 0.3  PROT 5.7* 6.1* 5.9* 6.5 6.0*  ALBUMIN 1.8* 1.8* 1.7* 1.8* 1.7*   No results for input(s): "LIPASE", "AMYLASE" in the last 168 hours. No results for input(s): "AMMONIA" in the last 168 hours. CBC: Recent Labs  Lab 11/04/23 0325 11/05/23 0407 11/06/23 0515 11/07/23 0625 11/08/23 0434  WBC 11.8* 14.9* 18.6* 25.0* 22.6*  NEUTROABS  --   --  15.6*  --   --   HGB 8.9* 8.5* 9.6* 9.7* 8.2*  HCT 27.5* 27.2* 31.6* 31.4* 26.9*  MCV 84.9 85.8 87.5 89.0 88.5  PLT 37* 64* 86* 174 225   Cardiac Enzymes: No results for input(s): "CKTOTAL", "CKMB", "CKMBINDEX", "TROPONINI" in the last 168 hours. Sepsis Labs: Recent Labs  Lab 11/05/23 0407 11/06/23 0515 11/07/23 0625 11/08/23 0434  WBC 14.9* 18.6* 25.0* 22.6*    Procedures/Operations     Nadia Torr 10/28/2023, 1:59 PM

## 2023-11-18 NOTE — Plan of Care (Signed)
Problem: Clinical Measurements: Goal: Ability to maintain clinical measurements within normal limits will improve Outcome: Not Progressing   Problem: Nutrition: Goal: Adequate nutrition will be maintained Outcome: Not Progressing

## 2023-11-18 NOTE — Progress Notes (Signed)
14 Days Post-Op   Subjective/Chief Complaint: Intubated sedated   Objective: Vital signs in last 24 hours: Temp:  [94.6 F (34.8 C)-97.8 F (36.6 C)] 94.6 F (34.8 C) (11/22 0330) Pulse Rate:  [76-89] 77 (11/22 0645) Resp:  [16-38] 20 (11/22 0645) BP: (70-106)/(47-60) 86/55 (11/22 0645) SpO2:  [98 %-100 %] 99 % (11/22 0645) FiO2 (%):  [40 %] 40 % (11/22 0736) Last BM Date : 11/08/23  Intake/Output from previous day: 11/21 0701 - 11/22 0700 In: 624.9 [I.V.:439.6; IV Piggyback:185.3] Out: 250 [Urine:250] Intake/Output this shift: No intake/output data recorded.  Ab soft incision clean  Lab Results:  Recent Labs    11/07/23 0625 11/08/23 0434  WBC 25.0* 22.6*  HGB 9.7* 8.2*  HCT 31.4* 26.9*  PLT 174 225   BMET Recent Labs    11/07/23 0625 11/08/23 0434  NA 135 130*  K 3.8 3.5  CL 107 104  CO2 15* 14*  GLUCOSE 224* 233*  BUN 162* 200*  CREATININE 3.11* 4.11*  CALCIUM 10.6* 10.5*   PT/INR Recent Labs    11/07/23 0625 11/08/23 0434  LABPROT 33.3* 38.0*  INR 3.2* 3.8*   ABG No results for input(s): "PHART", "HCO3" in the last 72 hours.  Invalid input(s): "PCO2", "PO2"  Studies/Results: No results found.  Anti-infectives: Anti-infectives (From admission, onward)    Start     Dose/Rate Route Frequency Ordered Stop   11/02/23 1500  vancomycin (VANCOREADY) IVPB 1250 mg/250 mL  Status:  Discontinued        1,250 mg 166.7 mL/hr over 90 Minutes Intravenous Every 24 hours 11/01/23 1508 11/02/23 1339   11/02/23 1339  vancomycin variable dose per unstable renal function (pharmacist dosing)  Status:  Discontinued         Does not apply See admin instructions 11/02/23 1339 11/03/23 1501   11/01/23 1500  vancomycin (VANCOREADY) IVPB 1750 mg/350 mL        1,750 mg 175 mL/hr over 120 Minutes Intravenous  Once 11/01/23 1424 11/01/23 1656   10/31/23 2200  piperacillin-tazobactam (ZOSYN) IVPB 3.375 g        3.375 g 12.5 mL/hr over 240 Minutes Intravenous Every  8 hours 10/31/23 1753 11/07/23 1928   11/01/2023 1400  ceFAZolin (ANCEF) IVPB 2g/100 mL premix        2 g 200 mL/hr over 30 Minutes Intravenous On call to O.R. 11/01/2023 1341 11/08/2023 1558   10/28/2023 1343  ceFAZolin (ANCEF) 2-4 GM/100ML-% IVPB       Note to Pharmacy: Sherren Kerns H: cabinet override      10/21/2023 1343 11/11/2023 1559       Assessment/Plan: POD 14 s/p Gastrojejunostomy 10/25/2023 by Dr. Robyne Peers for GOO secondary to PUD stricture/duodenitis and portal vein thrombosis -palliative extubation plans noted -continue current care   Matthew Molina 10/25/2023

## 2023-11-18 NOTE — Progress Notes (Signed)
Daily Progress Note   Patient Name: Matthew Molina       Date: 11/16/2023 DOB: August 28, 1951  Age: 73 y.o. MRN#: [id number] Attending Physician: Cheri Fowler, MD Primary Care Physician: Kerri Perches, MD Admit Date: 10/27/2023  Reason for Consultation/Follow-up: Establishing goals of care  Subjective: Medical records reviewed including progress notes, labs. Patient assessed at the bedside. He remains intubated and on levo. His daughter and sister are present visiting and we are then joined by several other family members. Discussed with RN.  Answered daughter's questions about course of patient's illness and complications during this hospitalization. Emotional support was provided. Confirmed that family was ready to proceed with compassionate extubation and provided education on the process of liberating patient from the ventilator. Discussed end of life transition and expectations, signs/symptoms of pain or distress, and management strategies to be expected. Discussed that patient will likely pass very quickly after transition to full comfort measures and allowing a natural death. Patient's family verbalized their understanding and appreciation. This PA was present for support as patient was extubated and provided PRN bolus of morphine. Adjusted orders for continuous infusion to allow further titration for comfort. Provided family with PMT contact information and condolences before stepping out to give family space for visitation.   Questions and concerns addressed. PMT will continue to support holistically.   Length of Stay: 17   Physical Exam Vitals and nursing note reviewed.  Constitutional:      General: He is not in acute distress.    Appearance: He is ill-appearing.     Interventions: He  is intubated.  Cardiovascular:     Rate and Rhythm: Normal rate.  Pulmonary:     Effort: Pulmonary effort is normal. He is intubated.  Skin:    General: Skin is warm and dry.  Neurological:     Mental Status: He is unresponsive.            Vital Signs: BP (!) 86/55   Pulse 77   Temp (!) 94.6 F (34.8 C) (Oral)   Resp 20   Ht 6' (1.829 m)   Wt 86.2 kg   SpO2 99%   BMI 25.77 kg/m  SpO2: SpO2: 99 % O2 Device: O2 Device: Ventilator O2 Flow Rate: O2 Flow Rate (L/min): 4 L/min      Palliative Assessment/Data: 10%   Palliative Care Assessment & Plan   Patient Profile: 72 y.o. male  with past medical history of IgG MGUS, COPD, DMII, HTN and HLD admitted on 11/14/2023 with abdominal pain, N/V.    Patient [facility] after recent hospitalization and was found to have findings concerning for gastric outlet obstruction secondary to peptic ulcer stricture/duodenitis, now s/p gastrojejunostomy on 11/8. He also had findings of portal vein thrombosis on anticoagulation, unfortunately decompensated with intubation and placed on vasopressors for shock. He has multiorgan system failure and poor prognosis.   PMT has been consulted to assist with goals of care conversation.  Assessment: Goals of care conversation Acute encephalopathy, multifactorial due to sepsis and metabolic acidosis Bilateral multifocal strokes  Severe distal duodenal obstruction s/p gastrojejunostomy 11/8 Septic shock and acute hypoxic respiratory failure due to asp PNA AKI on CKD3a  Recommendations/Plan: Continue DNR and proceed with extubation/transition to full comfort-focused care Adjusted morphine gtt and ordered boluses for air hunger/pain  Psychosocial and emotional support provided PMT will continue to follow and support   Prognosis:  Hours - Days  Discharge Planning: Anticipated Hospital Death  Care plan was discussed with Patient's family, RN, RT    MDM high         Lokelani Lutes Jeni Salles, PA-C  Palliative Medicine Team Team phone # [phone]  Thank you for allowing the Palliative Medicine Team to assist in the care of this patient. Please utilize secure chat with additional questions, if there is no response within 30 minutes please call the above phone number.  Palliative Medicine Team providers are available by phone from 7am to 7pm daily and can be reached through the team cell phone.  Should this patient require assistance outside of these hours, please call the patient's attending physician.

## 2023-11-18 NOTE — Progress Notes (Signed)
Time of death 1310. Pronounced with Dagmar Hait, RN. Family at bedside.

## 2023-11-18 NOTE — Progress Notes (Signed)
Patient extubated to comfort per MD's order with RT, PA and RN x2 at bedside.

## 2023-11-18 DEATH — deceased

## 2023-11-20 ENCOUNTER — Telehealth: Payer: Self-pay | Admitting: Family Medicine

## 2023-11-20 NOTE — Telephone Encounter (Unsigned)
Copied from CRM [id number]. Topic: General - Other >> Nov 20, 2023  3:54 PM Lovey Newcomer R wrote: Reason for CRM: Neighbor of the deceased, Rosalee, called in stating that the oxygen tank of the deceased was left in the apartment of where they lived and no one in the family has picked it up or gotten rid of it. She doesn't want it to be just outside and not too sure what to do with it. Please f/u at [phone]

## 2023-11-27 ENCOUNTER — Ambulatory Visit: Payer: 59 | Admitting: Gastroenterology

## 2023-12-02 ENCOUNTER — Other Ambulatory Visit: Payer: Self-pay | Admitting: Family Medicine

## 2023-12-07 ENCOUNTER — Ambulatory Visit: Payer: 59 | Admitting: Urology

## 2023-12-10 ENCOUNTER — Inpatient Hospital Stay: Payer: 59 | Admitting: Gastroenterology

## 2023-12-13 ENCOUNTER — Other Ambulatory Visit: Payer: Self-pay | Admitting: Family Medicine

## 2024-01-15 ENCOUNTER — Telehealth: Payer: Self-pay | Admitting: Family Medicine

## 2024-01-15 NOTE — Telephone Encounter (Unsigned)
Copied from CRM [id number]. Topic: General - Other >> Jan 15, 2024  3:06 PM Gildardo Pounds wrote: Reason for CRM: Patient's sister, Bo Merino, needs to know the date that patient was diagnosed with COPD. Callback number [phone]

## 2024-09-03 ENCOUNTER — Other Ambulatory Visit: Payer: 59

## 2024-09-10 ENCOUNTER — Ambulatory Visit: Payer: 59 | Admitting: Urology
